# Patient Record
Sex: Female | Born: 1970
Health system: Southern US, Academic
[De-identification: ages and names within clinical notes are randomized; demographics above are authoritative.]

## PROBLEM LIST (undated history)

## (undated) ENCOUNTER — Encounter

## (undated) ENCOUNTER — Ambulatory Visit

## (undated) ENCOUNTER — Telehealth

## (undated) ENCOUNTER — Ambulatory Visit: Payer: MEDICARE

## (undated) ENCOUNTER — Encounter: Attending: Dermatology | Primary: Dermatology

## (undated) ENCOUNTER — Ambulatory Visit: Attending: Pharmacist | Primary: Pharmacist

## (undated) ENCOUNTER — Encounter: Payer: MEDICARE | Attending: Pediatrics | Primary: Pediatrics

## (undated) ENCOUNTER — Non-Acute Institutional Stay: Payer: MEDICARE

## (undated) ENCOUNTER — Telehealth: Attending: Dermatology | Primary: Dermatology

## (undated) ENCOUNTER — Encounter
Payer: MEDICARE | Attending: Rehabilitative and Restorative Service Providers" | Primary: Rehabilitative and Restorative Service Providers"

## (undated) ENCOUNTER — Ambulatory Visit
Payer: MEDICARE | Attending: Student in an Organized Health Care Education/Training Program | Primary: Student in an Organized Health Care Education/Training Program

## (undated) ENCOUNTER — Encounter: Payer: MEDICARE | Attending: Dermatology | Primary: Dermatology

## (undated) DIAGNOSIS — G4730 Sleep apnea, unspecified: Secondary | ICD-10-CM

## (undated) DIAGNOSIS — I10 Essential (primary) hypertension: Secondary | ICD-10-CM

## (undated) DIAGNOSIS — M797 Fibromyalgia: Secondary | ICD-10-CM

## (undated) DIAGNOSIS — G5600 Carpal tunnel syndrome, unspecified upper limb: Secondary | ICD-10-CM

## (undated) DIAGNOSIS — E785 Hyperlipidemia, unspecified: Secondary | ICD-10-CM

## (undated) DIAGNOSIS — R87610 Atypical squamous cells of undetermined significance on cytologic smear of cervix (ASC-US): Secondary | ICD-10-CM

## (undated) DIAGNOSIS — C539 Malignant neoplasm of cervix uteri, unspecified: Secondary | ICD-10-CM

## (undated) DIAGNOSIS — G43909 Migraine, unspecified, not intractable, without status migrainosus: Secondary | ICD-10-CM

## (undated) DIAGNOSIS — E669 Obesity, unspecified: Secondary | ICD-10-CM

## (undated) DIAGNOSIS — S82891A Other fracture of right lower leg, initial encounter for closed fracture: Secondary | ICD-10-CM

## (undated) DIAGNOSIS — F419 Anxiety disorder, unspecified: Secondary | ICD-10-CM

## (undated) HISTORY — PX: CARPAL TUNNEL RELEASE: SHX101

## (undated) HISTORY — DX: Essential (primary) hypertension: I10

## (undated) HISTORY — DX: Obesity, unspecified: E66.9

## (undated) HISTORY — DX: Other fracture of right lower leg, initial encounter for closed fracture: S82.891A

## (undated) HISTORY — DX: Fibromyalgia: M79.7

## (undated) HISTORY — DX: Hyperlipidemia, unspecified: E78.5

## (undated) HISTORY — DX: Malignant neoplasm of cervix uteri, unspecified: C53.9

## (undated) HISTORY — DX: Atypical squamous cells of undetermined significance on cytologic smear of cervix (ASC-US): R87.610

## (undated) HISTORY — DX: Carpal tunnel syndrome, unspecified upper limb: G56.00

## (undated) HISTORY — DX: Anxiety disorder, unspecified: F41.9

---
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
## 1992-07-14 HISTORY — PX: CHOLECYSTECTOMY: SHX55

## 1996-05-16 DIAGNOSIS — C539 Malignant neoplasm of cervix uteri, unspecified: Secondary | ICD-10-CM

## 1996-05-16 HISTORY — DX: Malignant neoplasm of cervix uteri, unspecified: C53.9

## 1996-10-14 HISTORY — PX: ABDOMINAL HYSTERECTOMY: SHX81

## 1997-08-18 ENCOUNTER — Ambulatory Visit (HOSPITAL_COMMUNITY): Admission: RE | Admit: 1997-08-18 | Discharge: 1997-08-18 | Payer: Self-pay | Admitting: Gynecology

## 1997-09-18 ENCOUNTER — Other Ambulatory Visit: Admission: RE | Admit: 1997-09-18 | Discharge: 1997-09-18 | Payer: Self-pay | Admitting: Gynecology

## 1997-09-19 ENCOUNTER — Other Ambulatory Visit: Admission: RE | Admit: 1997-09-19 | Discharge: 1997-09-19 | Payer: Self-pay | Admitting: Gynecology

## 1997-10-15 ENCOUNTER — Other Ambulatory Visit: Admission: RE | Admit: 1997-10-15 | Discharge: 1997-10-15 | Payer: Self-pay | Admitting: Gynecology

## 1997-11-10 ENCOUNTER — Inpatient Hospital Stay (HOSPITAL_COMMUNITY): Admission: RE | Admit: 1997-11-10 | Discharge: 1997-11-13 | Payer: Self-pay | Admitting: Gynecology

## 1998-02-16 ENCOUNTER — Emergency Department (HOSPITAL_COMMUNITY): Admission: EM | Admit: 1998-02-16 | Discharge: 1998-02-16 | Payer: Self-pay | Admitting: Emergency Medicine

## 1999-06-24 ENCOUNTER — Other Ambulatory Visit: Admission: RE | Admit: 1999-06-24 | Discharge: 1999-06-24 | Payer: Self-pay | Admitting: Gynecology

## 1999-09-03 ENCOUNTER — Emergency Department (HOSPITAL_COMMUNITY): Admission: EM | Admit: 1999-09-03 | Discharge: 1999-09-03 | Payer: Self-pay | Admitting: Emergency Medicine

## 1999-09-03 ENCOUNTER — Encounter: Payer: Self-pay | Admitting: Emergency Medicine

## 1999-12-08 ENCOUNTER — Encounter: Payer: Self-pay | Admitting: Gynecology

## 1999-12-08 ENCOUNTER — Ambulatory Visit (HOSPITAL_COMMUNITY): Admission: RE | Admit: 1999-12-08 | Discharge: 1999-12-08 | Payer: Self-pay | Admitting: Gynecology

## 2000-07-14 ENCOUNTER — Other Ambulatory Visit: Admission: RE | Admit: 2000-07-14 | Discharge: 2000-07-14 | Payer: Self-pay | Admitting: Gynecology

## 2001-11-27 ENCOUNTER — Other Ambulatory Visit: Admission: RE | Admit: 2001-11-27 | Discharge: 2001-11-27 | Payer: Self-pay | Admitting: Gynecology

## 2001-11-30 ENCOUNTER — Encounter: Payer: Self-pay | Admitting: Gynecology

## 2001-11-30 ENCOUNTER — Ambulatory Visit (HOSPITAL_COMMUNITY): Admission: RE | Admit: 2001-11-30 | Discharge: 2001-11-30 | Payer: Self-pay | Admitting: Gynecology

## 2003-04-18 ENCOUNTER — Other Ambulatory Visit: Admission: RE | Admit: 2003-04-18 | Discharge: 2003-04-18 | Payer: Self-pay | Admitting: Gynecology

## 2003-04-21 ENCOUNTER — Emergency Department (HOSPITAL_COMMUNITY): Admission: EM | Admit: 2003-04-21 | Discharge: 2003-04-22 | Payer: Self-pay | Admitting: Emergency Medicine

## 2003-08-05 ENCOUNTER — Emergency Department (HOSPITAL_COMMUNITY): Admission: EM | Admit: 2003-08-05 | Discharge: 2003-08-05 | Payer: Self-pay | Admitting: Emergency Medicine

## 2003-10-09 ENCOUNTER — Emergency Department (HOSPITAL_COMMUNITY): Admission: EM | Admit: 2003-10-09 | Discharge: 2003-10-09 | Payer: Self-pay | Admitting: Emergency Medicine

## 2003-12-26 ENCOUNTER — Ambulatory Visit (HOSPITAL_COMMUNITY): Admission: RE | Admit: 2003-12-26 | Discharge: 2003-12-26 | Payer: Self-pay | Admitting: Family Medicine

## 2003-12-30 ENCOUNTER — Ambulatory Visit (HOSPITAL_COMMUNITY): Admission: RE | Admit: 2003-12-30 | Discharge: 2003-12-30 | Payer: Self-pay | Admitting: Family Medicine

## 2004-06-17 ENCOUNTER — Other Ambulatory Visit: Admission: RE | Admit: 2004-06-17 | Discharge: 2004-06-17 | Payer: Self-pay | Admitting: Gynecology

## 2004-06-25 ENCOUNTER — Encounter: Admission: RE | Admit: 2004-06-25 | Discharge: 2004-06-25 | Payer: Self-pay | Admitting: Family Medicine

## 2005-06-24 ENCOUNTER — Other Ambulatory Visit: Admission: RE | Admit: 2005-06-24 | Discharge: 2005-06-24 | Payer: Self-pay | Admitting: Gynecology

## 2005-12-09 ENCOUNTER — Ambulatory Visit: Payer: Self-pay | Admitting: Family Medicine

## 2006-01-13 ENCOUNTER — Ambulatory Visit: Payer: Self-pay | Admitting: Family Medicine

## 2006-03-10 ENCOUNTER — Encounter: Admission: RE | Admit: 2006-03-10 | Discharge: 2006-03-10 | Payer: Self-pay | Admitting: Rheumatology

## 2006-06-28 DIAGNOSIS — F411 Generalized anxiety disorder: Secondary | ICD-10-CM

## 2006-06-28 DIAGNOSIS — I10 Essential (primary) hypertension: Secondary | ICD-10-CM | POA: Insufficient documentation

## 2006-07-13 ENCOUNTER — Encounter: Admission: RE | Admit: 2006-07-13 | Discharge: 2006-07-13 | Payer: Self-pay | Admitting: Gynecology

## 2006-07-21 ENCOUNTER — Other Ambulatory Visit: Admission: RE | Admit: 2006-07-21 | Discharge: 2006-07-21 | Payer: Self-pay | Admitting: Gynecology

## 2006-07-28 ENCOUNTER — Ambulatory Visit: Payer: Self-pay | Admitting: Family Medicine

## 2006-07-28 LAB — CONVERTED CEMR LAB
BUN: 11 mg/dL (ref 6–23)
CO2: 28 meq/L (ref 19–32)
Calcium: 9.2 mg/dL (ref 8.4–10.5)
Chloride: 106 meq/L (ref 96–112)
Cholesterol: 256 mg/dL (ref 0–200)
Creatinine, Ser: 0.7 mg/dL (ref 0.4–1.2)
Direct LDL: 184.5 mg/dL
GFR calc Af Amer: 122 mL/min
GFR calc non Af Amer: 101 mL/min
Glucose, Bld: 74 mg/dL (ref 70–99)
HDL: 30.8 mg/dL — ABNORMAL LOW (ref >39.0)
Potassium: 3.5 meq/L (ref 3.5–5.1)
Sodium: 143 meq/L (ref 135–145)
Total CHOL/HDL Ratio: 8.3
Triglycerides: 255 mg/dL (ref 0–149)
VLDL: 51 mg/dL — ABNORMAL HIGH (ref 0–40)

## 2006-08-11 ENCOUNTER — Ambulatory Visit: Payer: Self-pay | Admitting: Family Medicine

## 2006-08-11 LAB — CONVERTED CEMR LAB
ALT: 34 units/L (ref 0–40)
AST: 25 units/L (ref 0–37)

## 2006-09-20 DIAGNOSIS — M797 Fibromyalgia: Secondary | ICD-10-CM

## 2006-09-22 ENCOUNTER — Encounter: Payer: Self-pay | Admitting: Family Medicine

## 2006-09-22 ENCOUNTER — Encounter (INDEPENDENT_AMBULATORY_CARE_PROVIDER_SITE_OTHER): Payer: Self-pay | Admitting: Family Medicine

## 2006-09-22 ENCOUNTER — Encounter: Admission: RE | Admit: 2006-09-22 | Discharge: 2006-09-22 | Payer: Self-pay | Admitting: Family Medicine

## 2006-09-22 ENCOUNTER — Ambulatory Visit: Payer: Self-pay | Admitting: Family Medicine

## 2006-09-22 DIAGNOSIS — E785 Hyperlipidemia, unspecified: Secondary | ICD-10-CM | POA: Insufficient documentation

## 2006-09-22 DIAGNOSIS — M25569 Pain in unspecified knee: Secondary | ICD-10-CM

## 2006-09-25 ENCOUNTER — Telehealth (INDEPENDENT_AMBULATORY_CARE_PROVIDER_SITE_OTHER): Payer: Self-pay | Admitting: *Deleted

## 2006-09-25 LAB — CONVERTED CEMR LAB
CO2: 30 meq/L (ref 19–32)
Calcium: 9.2 mg/dL (ref 8.4–10.5)
Creatinine, Ser: 0.8 mg/dL (ref 0.4–1.2)
Direct LDL: 128.2 mg/dL
GFR calc Af Amer: 104 mL/min
Glucose, Bld: 84 mg/dL (ref 70–99)
HDL: 27.2 mg/dL — ABNORMAL LOW (ref >39.0)
Potassium: 3.9 meq/L (ref 3.5–5.1)
Triglycerides: 239 mg/dL (ref 0–149)

## 2006-09-26 ENCOUNTER — Telehealth (INDEPENDENT_AMBULATORY_CARE_PROVIDER_SITE_OTHER): Payer: Self-pay | Admitting: *Deleted

## 2006-12-01 ENCOUNTER — Encounter (INDEPENDENT_AMBULATORY_CARE_PROVIDER_SITE_OTHER): Payer: Self-pay | Admitting: Family Medicine

## 2006-12-19 ENCOUNTER — Telehealth (INDEPENDENT_AMBULATORY_CARE_PROVIDER_SITE_OTHER): Payer: Self-pay | Admitting: *Deleted

## 2006-12-29 ENCOUNTER — Encounter (INDEPENDENT_AMBULATORY_CARE_PROVIDER_SITE_OTHER): Payer: Self-pay | Admitting: Family Medicine

## 2007-01-03 ENCOUNTER — Ambulatory Visit: Payer: Self-pay | Admitting: Family Medicine

## 2007-01-03 DIAGNOSIS — L0291 Cutaneous abscess, unspecified: Secondary | ICD-10-CM

## 2007-01-03 DIAGNOSIS — L0390 Cellulitis, unspecified: Secondary | ICD-10-CM | POA: Insufficient documentation

## 2007-01-03 LAB — CONVERTED CEMR LAB
AST: 14 units/L (ref 0–37)
HDL: 23.6 mg/dL — ABNORMAL LOW (ref >39.0)
Total CHOL/HDL Ratio: 6.9
Triglycerides: 312 mg/dL (ref 0–149)

## 2007-01-04 ENCOUNTER — Encounter: Admission: RE | Admit: 2007-01-04 | Discharge: 2007-01-04 | Payer: Self-pay | Admitting: Cardiology

## 2007-01-04 ENCOUNTER — Telehealth (INDEPENDENT_AMBULATORY_CARE_PROVIDER_SITE_OTHER): Payer: Self-pay | Admitting: *Deleted

## 2007-01-12 ENCOUNTER — Encounter (INDEPENDENT_AMBULATORY_CARE_PROVIDER_SITE_OTHER): Payer: Self-pay | Admitting: Family Medicine

## 2007-01-12 ENCOUNTER — Telehealth (INDEPENDENT_AMBULATORY_CARE_PROVIDER_SITE_OTHER): Payer: Self-pay | Admitting: *Deleted

## 2007-01-17 ENCOUNTER — Ambulatory Visit: Payer: Self-pay | Admitting: Family Medicine

## 2007-01-19 ENCOUNTER — Telehealth (INDEPENDENT_AMBULATORY_CARE_PROVIDER_SITE_OTHER): Payer: Self-pay | Admitting: *Deleted

## 2007-01-29 ENCOUNTER — Telehealth (INDEPENDENT_AMBULATORY_CARE_PROVIDER_SITE_OTHER): Payer: Self-pay | Admitting: *Deleted

## 2007-02-16 ENCOUNTER — Encounter (INDEPENDENT_AMBULATORY_CARE_PROVIDER_SITE_OTHER): Payer: Self-pay | Admitting: Family Medicine

## 2007-03-01 ENCOUNTER — Ambulatory Visit: Payer: Self-pay | Admitting: Family Medicine

## 2007-03-02 ENCOUNTER — Telehealth (INDEPENDENT_AMBULATORY_CARE_PROVIDER_SITE_OTHER): Payer: Self-pay | Admitting: *Deleted

## 2007-03-06 ENCOUNTER — Telehealth (INDEPENDENT_AMBULATORY_CARE_PROVIDER_SITE_OTHER): Payer: Self-pay | Admitting: *Deleted

## 2007-03-06 ENCOUNTER — Encounter (INDEPENDENT_AMBULATORY_CARE_PROVIDER_SITE_OTHER): Payer: Self-pay | Admitting: *Deleted

## 2007-03-12 ENCOUNTER — Telehealth (INDEPENDENT_AMBULATORY_CARE_PROVIDER_SITE_OTHER): Payer: Self-pay | Admitting: *Deleted

## 2007-03-14 ENCOUNTER — Telehealth (INDEPENDENT_AMBULATORY_CARE_PROVIDER_SITE_OTHER): Payer: Self-pay | Admitting: *Deleted

## 2007-07-18 ENCOUNTER — Telehealth (INDEPENDENT_AMBULATORY_CARE_PROVIDER_SITE_OTHER): Payer: Self-pay | Admitting: Family Medicine

## 2007-09-14 ENCOUNTER — Encounter: Payer: Self-pay | Admitting: Internal Medicine

## 2007-09-14 ENCOUNTER — Other Ambulatory Visit: Admission: RE | Admit: 2007-09-14 | Discharge: 2007-09-14 | Payer: Self-pay | Admitting: Gynecology

## 2007-12-20 ENCOUNTER — Ambulatory Visit: Payer: Self-pay | Admitting: Family Medicine

## 2008-01-18 ENCOUNTER — Telehealth: Payer: Self-pay | Admitting: Family Medicine

## 2008-01-28 ENCOUNTER — Ambulatory Visit: Payer: Self-pay | Admitting: Family Medicine

## 2008-03-17 ENCOUNTER — Ambulatory Visit: Payer: Self-pay | Admitting: Family Medicine

## 2008-03-26 ENCOUNTER — Ambulatory Visit: Payer: Self-pay | Admitting: Family Medicine

## 2008-03-26 DIAGNOSIS — R609 Edema, unspecified: Secondary | ICD-10-CM | POA: Insufficient documentation

## 2008-03-27 ENCOUNTER — Telehealth: Payer: Self-pay | Admitting: Family Medicine

## 2008-03-27 LAB — CONVERTED CEMR LAB
BUN: 16 mg/dL (ref 6–23)
Basophils Absolute: 0 10*3/uL (ref 0.0–0.1)
Basophils Relative: 0 % (ref 0.0–3.0)
Calcium: 9.5 mg/dL (ref 8.4–10.5)
Creatinine, Ser: 1.1 mg/dL (ref 0.4–1.2)
Eosinophils Absolute: 0.4 10*3/uL (ref 0.0–0.7)
Eosinophils Relative: 3.2 % (ref 0.0–5.0)
GFR calc Af Amer: 71 mL/min
GFR calc non Af Amer: 59 mL/min
HCT: 40.3 % (ref 36.0–46.0)
Hemoglobin: 14.1 g/dL (ref 12.0–15.0)
MCHC: 34.9 g/dL (ref 30.0–36.0)
MCV: 85 fL (ref 78.0–100.0)
Monocytes Absolute: 0.2 10*3/uL (ref 0.1–1.0)
Neutro Abs: 9.2 10*3/uL — ABNORMAL HIGH (ref 1.4–7.7)
Neutrophils Relative %: 76.1 % (ref 43.0–77.0)
RBC: 4.74 M/uL (ref 3.87–5.11)
TSH: 1.93 microintl units/mL (ref 0.35–5.50)

## 2008-04-02 ENCOUNTER — Ambulatory Visit: Payer: Self-pay | Admitting: Family Medicine

## 2008-04-03 ENCOUNTER — Emergency Department (HOSPITAL_COMMUNITY): Admission: EM | Admit: 2008-04-03 | Discharge: 2008-04-03 | Payer: Self-pay | Admitting: Emergency Medicine

## 2008-04-03 ENCOUNTER — Ambulatory Visit: Payer: Self-pay | Admitting: Family Medicine

## 2008-04-03 DIAGNOSIS — T887XXA Unspecified adverse effect of drug or medicament, initial encounter: Secondary | ICD-10-CM

## 2008-04-03 LAB — CONVERTED CEMR LAB
AST: 29 units/L (ref 0–37)
Albumin: 3.2 g/dL — ABNORMAL LOW (ref 3.5–5.2)
Alkaline Phosphatase: 54 units/L (ref 39–117)
BUN: 14 mg/dL (ref 6–23)
Bilirubin, Direct: 0.1 mg/dL (ref 0.0–0.3)
Chloride: 112 meq/L (ref 96–112)
GFR calc Af Amer: 80 mL/min
Glucose, Bld: 87 mg/dL (ref 70–99)
Potassium: 4.7 meq/L (ref 3.5–5.1)
Sodium: 138 meq/L (ref 135–145)
Total Protein: 6.5 g/dL (ref 6.0–8.3)

## 2008-04-04 ENCOUNTER — Telehealth: Payer: Self-pay | Admitting: Family Medicine

## 2008-04-04 ENCOUNTER — Encounter (INDEPENDENT_AMBULATORY_CARE_PROVIDER_SITE_OTHER): Payer: Self-pay | Admitting: *Deleted

## 2008-05-07 ENCOUNTER — Telehealth (INDEPENDENT_AMBULATORY_CARE_PROVIDER_SITE_OTHER): Payer: Self-pay | Admitting: *Deleted

## 2008-06-11 ENCOUNTER — Ambulatory Visit: Payer: Self-pay | Admitting: Family Medicine

## 2008-06-11 DIAGNOSIS — L03039 Cellulitis of unspecified toe: Secondary | ICD-10-CM | POA: Insufficient documentation

## 2008-06-17 ENCOUNTER — Ambulatory Visit: Payer: Self-pay | Admitting: Family Medicine

## 2008-06-17 DIAGNOSIS — L600 Ingrowing nail: Secondary | ICD-10-CM | POA: Insufficient documentation

## 2008-07-31 ENCOUNTER — Telehealth: Payer: Self-pay | Admitting: Family Medicine

## 2008-08-13 ENCOUNTER — Ambulatory Visit: Payer: Self-pay | Admitting: Family Medicine

## 2008-08-13 DIAGNOSIS — R519 Headache, unspecified: Secondary | ICD-10-CM | POA: Insufficient documentation

## 2008-08-13 DIAGNOSIS — R110 Nausea: Secondary | ICD-10-CM

## 2008-08-13 DIAGNOSIS — R51 Headache: Secondary | ICD-10-CM

## 2008-08-13 LAB — CONVERTED CEMR LAB
Bilirubin Urine: NEGATIVE
Ketones, urine, test strip: NEGATIVE
Nitrite: NEGATIVE
Protein, U semiquant: NEGATIVE
Urobilinogen, UA: 0.2

## 2008-08-14 ENCOUNTER — Telehealth: Payer: Self-pay | Admitting: Family Medicine

## 2008-08-14 LAB — CONVERTED CEMR LAB
AST: 26 units/L (ref 0–37)
Albumin: 3.5 g/dL (ref 3.5–5.2)
BUN: 12 mg/dL (ref 6–23)
Basophils Absolute: 0.1 10*3/uL (ref 0.0–0.1)
CO2: 27 meq/L (ref 19–32)
Eosinophils Absolute: 0.3 10*3/uL (ref 0.0–0.7)
GFR calc non Af Amer: 74.21 mL/min (ref >60)
Glucose, Bld: 85 mg/dL (ref 70–99)
HCT: 40.6 % (ref 36.0–46.0)
Hemoglobin: 14.2 g/dL (ref 12.0–15.0)
Lipase: 19 units/L (ref 11.0–59.0)
Lymphs Abs: 2.6 10*3/uL (ref 0.7–4.0)
MCHC: 35.1 g/dL (ref 30.0–36.0)
Monocytes Absolute: 0.7 10*3/uL (ref 0.1–1.0)
Neutro Abs: 5.1 10*3/uL (ref 1.4–7.7)
Platelets: 297 10*3/uL (ref 150.0–400.0)
Potassium: 4.8 meq/L (ref 3.5–5.1)
RDW: 13.3 % (ref 11.5–14.6)
Sodium: 141 meq/L (ref 135–145)
TSH: 1.48 microintl units/mL (ref 0.35–5.50)
Total Bilirubin: 0.5 mg/dL (ref 0.3–1.2)

## 2008-08-18 ENCOUNTER — Encounter (INDEPENDENT_AMBULATORY_CARE_PROVIDER_SITE_OTHER): Payer: Self-pay | Admitting: *Deleted

## 2008-09-03 ENCOUNTER — Encounter: Payer: Self-pay | Admitting: Family Medicine

## 2008-09-16 ENCOUNTER — Encounter: Payer: Self-pay | Admitting: Gynecology

## 2008-09-16 ENCOUNTER — Ambulatory Visit: Payer: Self-pay | Admitting: Gynecology

## 2008-09-16 ENCOUNTER — Other Ambulatory Visit: Admission: RE | Admit: 2008-09-16 | Discharge: 2008-09-16 | Payer: Self-pay | Admitting: Gynecology

## 2008-09-19 ENCOUNTER — Ambulatory Visit: Payer: Self-pay | Admitting: Family Medicine

## 2008-09-19 DIAGNOSIS — L989 Disorder of the skin and subcutaneous tissue, unspecified: Secondary | ICD-10-CM | POA: Insufficient documentation

## 2008-09-19 DIAGNOSIS — R5381 Other malaise: Secondary | ICD-10-CM | POA: Insufficient documentation

## 2008-09-19 DIAGNOSIS — R5383 Other fatigue: Secondary | ICD-10-CM

## 2008-09-22 ENCOUNTER — Telehealth: Payer: Self-pay | Admitting: Family Medicine

## 2008-09-23 ENCOUNTER — Telehealth (INDEPENDENT_AMBULATORY_CARE_PROVIDER_SITE_OTHER): Payer: Self-pay | Admitting: *Deleted

## 2008-09-23 LAB — CONVERTED CEMR LAB
ALT: 34 units/L (ref 0–35)
AST: 25 units/L (ref 0–37)
Albumin: 3.8 g/dL (ref 3.5–5.2)
Basophils Relative: 1 % (ref 0.0–3.0)
CO2: 24 meq/L (ref 19–32)
Calcium: 9.1 mg/dL (ref 8.4–10.5)
Cholesterol: 230 mg/dL — ABNORMAL HIGH (ref 0–200)
Eosinophils Absolute: 0.3 10*3/uL (ref 0.0–0.7)
GFR calc non Af Amer: 65.68 mL/min (ref >60)
HDL: 29.4 mg/dL — ABNORMAL LOW (ref >39.00)
Hemoglobin: 14.4 g/dL (ref 12.0–15.0)
Lymphocytes Relative: 22.8 % (ref 12.0–46.0)
MCHC: 33.1 g/dL (ref 30.0–36.0)
Monocytes Relative: 5.4 % (ref 3.0–12.0)
Neutrophils Relative %: 67.6 % (ref 43.0–77.0)
Potassium: 3.9 meq/L (ref 3.5–5.1)
RBC: 4.99 M/uL (ref 3.87–5.11)
Sodium: 139 meq/L (ref 135–145)
Total CHOL/HDL Ratio: 8
Triglycerides: 177 mg/dL — ABNORMAL HIGH (ref 0.0–149.0)
WBC: 9.8 10*3/uL (ref 4.5–10.5)

## 2008-09-29 ENCOUNTER — Encounter: Payer: Self-pay | Admitting: Family Medicine

## 2008-10-22 ENCOUNTER — Ambulatory Visit: Payer: Self-pay | Admitting: Family Medicine

## 2008-10-27 LAB — CONVERTED CEMR LAB
GFR calc non Af Amer: 74.13 mL/min (ref >60)
Potassium: 4.7 meq/L (ref 3.5–5.1)
Sodium: 140 meq/L (ref 135–145)

## 2008-10-28 ENCOUNTER — Telehealth (INDEPENDENT_AMBULATORY_CARE_PROVIDER_SITE_OTHER): Payer: Self-pay | Admitting: *Deleted

## 2008-10-31 ENCOUNTER — Ambulatory Visit: Payer: Self-pay | Admitting: Gynecology

## 2008-11-05 ENCOUNTER — Ambulatory Visit: Payer: Self-pay | Admitting: Family Medicine

## 2008-11-05 DIAGNOSIS — M79609 Pain in unspecified limb: Secondary | ICD-10-CM

## 2008-11-07 LAB — CONVERTED CEMR LAB
CO2: 25 meq/L (ref 19–32)
Chloride: 102 meq/L (ref 96–112)
Sodium: 138 meq/L (ref 135–145)

## 2008-11-24 ENCOUNTER — Ambulatory Visit: Payer: Self-pay | Admitting: Family Medicine

## 2008-11-24 DIAGNOSIS — J309 Allergic rhinitis, unspecified: Secondary | ICD-10-CM | POA: Insufficient documentation

## 2008-11-28 ENCOUNTER — Encounter (INDEPENDENT_AMBULATORY_CARE_PROVIDER_SITE_OTHER): Payer: Self-pay | Admitting: *Deleted

## 2008-12-02 ENCOUNTER — Telehealth: Payer: Self-pay | Admitting: Family Medicine

## 2008-12-19 ENCOUNTER — Telehealth (INDEPENDENT_AMBULATORY_CARE_PROVIDER_SITE_OTHER): Payer: Self-pay | Admitting: *Deleted

## 2008-12-23 ENCOUNTER — Ambulatory Visit: Payer: Self-pay | Admitting: Family Medicine

## 2008-12-23 DIAGNOSIS — R209 Unspecified disturbances of skin sensation: Secondary | ICD-10-CM | POA: Insufficient documentation

## 2008-12-23 DIAGNOSIS — L02219 Cutaneous abscess of trunk, unspecified: Secondary | ICD-10-CM

## 2008-12-23 DIAGNOSIS — L03319 Cellulitis of trunk, unspecified: Secondary | ICD-10-CM

## 2009-01-05 ENCOUNTER — Ambulatory Visit: Payer: Self-pay | Admitting: Family Medicine

## 2009-01-05 LAB — CONVERTED CEMR LAB
Bilirubin Urine: NEGATIVE
Blood in Urine, dipstick: NEGATIVE
Glucose, Urine, Semiquant: NEGATIVE
Ketones, urine, test strip: NEGATIVE
Protein, U semiquant: NEGATIVE
pH: 6

## 2009-01-26 ENCOUNTER — Telehealth (INDEPENDENT_AMBULATORY_CARE_PROVIDER_SITE_OTHER): Payer: Self-pay | Admitting: *Deleted

## 2009-01-27 ENCOUNTER — Telehealth (INDEPENDENT_AMBULATORY_CARE_PROVIDER_SITE_OTHER): Payer: Self-pay | Admitting: *Deleted

## 2009-02-13 ENCOUNTER — Telehealth (INDEPENDENT_AMBULATORY_CARE_PROVIDER_SITE_OTHER): Payer: Self-pay | Admitting: *Deleted

## 2009-02-18 ENCOUNTER — Ambulatory Visit: Payer: Self-pay | Admitting: Family Medicine

## 2009-03-04 ENCOUNTER — Ambulatory Visit: Payer: Self-pay | Admitting: Family Medicine

## 2009-03-24 ENCOUNTER — Encounter: Payer: Self-pay | Admitting: Gynecology

## 2009-03-24 ENCOUNTER — Other Ambulatory Visit: Admission: RE | Admit: 2009-03-24 | Discharge: 2009-03-24 | Payer: Self-pay | Admitting: Gynecology

## 2009-03-24 ENCOUNTER — Ambulatory Visit: Payer: Self-pay | Admitting: Gynecology

## 2009-03-25 ENCOUNTER — Ambulatory Visit: Payer: Self-pay | Admitting: Family

## 2009-03-30 ENCOUNTER — Encounter (INDEPENDENT_AMBULATORY_CARE_PROVIDER_SITE_OTHER): Payer: Self-pay | Admitting: *Deleted

## 2009-04-13 ENCOUNTER — Telehealth (INDEPENDENT_AMBULATORY_CARE_PROVIDER_SITE_OTHER): Payer: Self-pay | Admitting: *Deleted

## 2009-05-04 ENCOUNTER — Telehealth (INDEPENDENT_AMBULATORY_CARE_PROVIDER_SITE_OTHER): Payer: Self-pay | Admitting: *Deleted

## 2009-05-05 ENCOUNTER — Telehealth (INDEPENDENT_AMBULATORY_CARE_PROVIDER_SITE_OTHER): Payer: Self-pay | Admitting: *Deleted

## 2009-05-19 ENCOUNTER — Telehealth (INDEPENDENT_AMBULATORY_CARE_PROVIDER_SITE_OTHER): Payer: Self-pay | Admitting: *Deleted

## 2009-06-09 ENCOUNTER — Telehealth: Payer: Self-pay | Admitting: Family

## 2009-06-22 ENCOUNTER — Telehealth (INDEPENDENT_AMBULATORY_CARE_PROVIDER_SITE_OTHER): Payer: Self-pay | Admitting: *Deleted

## 2009-06-29 ENCOUNTER — Telehealth (INDEPENDENT_AMBULATORY_CARE_PROVIDER_SITE_OTHER): Payer: Self-pay | Admitting: *Deleted

## 2009-06-30 ENCOUNTER — Ambulatory Visit: Payer: Self-pay | Admitting: Internal Medicine

## 2009-06-30 DIAGNOSIS — J069 Acute upper respiratory infection, unspecified: Secondary | ICD-10-CM | POA: Insufficient documentation

## 2009-07-11 ENCOUNTER — Encounter: Admission: RE | Admit: 2009-07-11 | Discharge: 2009-07-11 | Payer: Self-pay | Admitting: Orthopedic Surgery

## 2009-07-21 ENCOUNTER — Telehealth (INDEPENDENT_AMBULATORY_CARE_PROVIDER_SITE_OTHER): Payer: Self-pay | Admitting: *Deleted

## 2009-08-07 ENCOUNTER — Telehealth (INDEPENDENT_AMBULATORY_CARE_PROVIDER_SITE_OTHER): Payer: Self-pay | Admitting: *Deleted

## 2009-08-07 ENCOUNTER — Telehealth: Payer: Self-pay | Admitting: Family

## 2009-08-17 ENCOUNTER — Ambulatory Visit: Payer: Self-pay | Admitting: Family Medicine

## 2009-08-17 DIAGNOSIS — M6240 Contracture of muscle, unspecified site: Secondary | ICD-10-CM | POA: Insufficient documentation

## 2009-08-18 ENCOUNTER — Telehealth (INDEPENDENT_AMBULATORY_CARE_PROVIDER_SITE_OTHER): Payer: Self-pay | Admitting: *Deleted

## 2009-08-24 ENCOUNTER — Telehealth (INDEPENDENT_AMBULATORY_CARE_PROVIDER_SITE_OTHER): Payer: Self-pay | Admitting: *Deleted

## 2009-09-03 ENCOUNTER — Encounter: Admission: RE | Admit: 2009-09-03 | Discharge: 2009-11-03 | Payer: Self-pay | Admitting: Family Medicine

## 2009-09-09 ENCOUNTER — Encounter: Payer: Self-pay | Admitting: Family Medicine

## 2009-09-11 ENCOUNTER — Telehealth (INDEPENDENT_AMBULATORY_CARE_PROVIDER_SITE_OTHER): Payer: Self-pay | Admitting: *Deleted

## 2009-09-17 ENCOUNTER — Ambulatory Visit: Payer: Self-pay | Admitting: Family Medicine

## 2009-10-06 ENCOUNTER — Telehealth (INDEPENDENT_AMBULATORY_CARE_PROVIDER_SITE_OTHER): Payer: Self-pay | Admitting: *Deleted

## 2009-10-08 ENCOUNTER — Ambulatory Visit: Payer: Self-pay | Admitting: Family Medicine

## 2009-10-08 DIAGNOSIS — R7309 Other abnormal glucose: Secondary | ICD-10-CM

## 2009-10-08 LAB — CONVERTED CEMR LAB: Blood Glucose, Fingerstick: 102

## 2009-10-09 ENCOUNTER — Telehealth (INDEPENDENT_AMBULATORY_CARE_PROVIDER_SITE_OTHER): Payer: Self-pay | Admitting: *Deleted

## 2009-10-09 LAB — CONVERTED CEMR LAB
Chloride: 106 meq/L (ref 96–112)
Creatinine, Ser: 0.7 mg/dL (ref 0.4–1.2)
Eosinophils Relative: 4.5 % (ref 0.0–5.0)
FSH: 2.9 milliintl units/mL
HCT: 39.5 % (ref 36.0–46.0)
Hgb A1c MFr Bld: 5.6 % (ref 4.6–6.5)
LH: 2.1 milliintl units/mL
Lymphs Abs: 2 10*3/uL (ref 0.7–4.0)
Monocytes Relative: 7 % (ref 3.0–12.0)
Platelets: 251 10*3/uL (ref 150.0–400.0)
Potassium: 4.8 meq/L (ref 3.5–5.1)
TSH: 1.81 microintl units/mL (ref 0.35–5.50)
WBC: 7.1 10*3/uL (ref 4.5–10.5)

## 2009-10-20 ENCOUNTER — Telehealth (INDEPENDENT_AMBULATORY_CARE_PROVIDER_SITE_OTHER): Payer: Self-pay | Admitting: *Deleted

## 2009-10-29 ENCOUNTER — Encounter: Payer: Self-pay | Admitting: Family Medicine

## 2009-11-03 ENCOUNTER — Ambulatory Visit: Payer: Self-pay | Admitting: Family Medicine

## 2009-11-05 ENCOUNTER — Telehealth (INDEPENDENT_AMBULATORY_CARE_PROVIDER_SITE_OTHER): Payer: Self-pay | Admitting: *Deleted

## 2009-11-05 LAB — CONVERTED CEMR LAB
ALT: 41 units/L — ABNORMAL HIGH (ref 0–35)
AST: 37 units/L (ref 0–37)
Albumin: 4.1 g/dL (ref 3.5–5.2)
Cholesterol: 252 mg/dL — ABNORMAL HIGH (ref 0–200)
Direct LDL: 171.1 mg/dL
Total CHOL/HDL Ratio: 7
Total Protein: 7.5 g/dL (ref 6.0–8.3)
Triglycerides: 334 mg/dL — ABNORMAL HIGH (ref 0.0–149.0)

## 2009-11-25 ENCOUNTER — Ambulatory Visit: Payer: Self-pay | Admitting: Gynecology

## 2009-11-25 ENCOUNTER — Other Ambulatory Visit: Admission: RE | Admit: 2009-11-25 | Discharge: 2009-11-25 | Payer: Self-pay | Admitting: Gynecology

## 2009-11-30 ENCOUNTER — Telehealth: Payer: Self-pay | Admitting: Family Medicine

## 2009-12-21 ENCOUNTER — Encounter: Admission: RE | Admit: 2009-12-21 | Discharge: 2009-12-21 | Payer: Self-pay | Admitting: Gynecology

## 2009-12-28 ENCOUNTER — Telehealth: Payer: Self-pay | Admitting: Family Medicine

## 2009-12-31 ENCOUNTER — Ambulatory Visit: Payer: Self-pay | Admitting: Family Medicine

## 2009-12-31 DIAGNOSIS — J019 Acute sinusitis, unspecified: Secondary | ICD-10-CM

## 2009-12-31 DIAGNOSIS — R748 Abnormal levels of other serum enzymes: Secondary | ICD-10-CM | POA: Insufficient documentation

## 2010-01-01 ENCOUNTER — Telehealth (INDEPENDENT_AMBULATORY_CARE_PROVIDER_SITE_OTHER): Payer: Self-pay | Admitting: *Deleted

## 2010-01-01 LAB — CONVERTED CEMR LAB: Albumin: 3.7 g/dL (ref 3.5–5.2)

## 2010-01-04 ENCOUNTER — Telehealth (INDEPENDENT_AMBULATORY_CARE_PROVIDER_SITE_OTHER): Payer: Self-pay | Admitting: *Deleted

## 2010-01-06 ENCOUNTER — Telehealth (INDEPENDENT_AMBULATORY_CARE_PROVIDER_SITE_OTHER): Payer: Self-pay | Admitting: *Deleted

## 2010-01-08 ENCOUNTER — Encounter: Payer: Self-pay | Admitting: Family Medicine

## 2010-01-08 ENCOUNTER — Ambulatory Visit: Payer: Self-pay | Admitting: Gynecology

## 2010-01-10 ENCOUNTER — Encounter: Payer: Self-pay | Admitting: Family Medicine

## 2010-01-20 ENCOUNTER — Encounter: Payer: Self-pay | Admitting: Family Medicine

## 2010-01-21 ENCOUNTER — Telehealth (INDEPENDENT_AMBULATORY_CARE_PROVIDER_SITE_OTHER): Payer: Self-pay | Admitting: *Deleted

## 2010-01-29 ENCOUNTER — Telehealth (INDEPENDENT_AMBULATORY_CARE_PROVIDER_SITE_OTHER): Payer: Self-pay | Admitting: *Deleted

## 2010-02-15 ENCOUNTER — Ambulatory Visit: Payer: Self-pay | Admitting: Family Medicine

## 2010-02-18 ENCOUNTER — Telehealth (INDEPENDENT_AMBULATORY_CARE_PROVIDER_SITE_OTHER): Payer: Self-pay | Admitting: *Deleted

## 2010-02-26 ENCOUNTER — Telehealth (INDEPENDENT_AMBULATORY_CARE_PROVIDER_SITE_OTHER): Payer: Self-pay | Admitting: *Deleted

## 2010-03-15 ENCOUNTER — Telehealth (INDEPENDENT_AMBULATORY_CARE_PROVIDER_SITE_OTHER): Payer: Self-pay | Admitting: *Deleted

## 2010-04-13 ENCOUNTER — Telehealth (INDEPENDENT_AMBULATORY_CARE_PROVIDER_SITE_OTHER): Payer: Self-pay | Admitting: *Deleted

## 2010-04-22 ENCOUNTER — Telehealth (INDEPENDENT_AMBULATORY_CARE_PROVIDER_SITE_OTHER): Payer: Self-pay | Admitting: *Deleted

## 2010-04-27 ENCOUNTER — Ambulatory Visit: Payer: Self-pay | Admitting: Family Medicine

## 2010-04-28 LAB — CONVERTED CEMR LAB
ALT: 71 units/L — ABNORMAL HIGH (ref 0–35)
Albumin: 3.6 g/dL (ref 3.5–5.2)
Chloride: 103 meq/L (ref 96–112)
Cholesterol: 242 mg/dL — ABNORMAL HIGH (ref 0–200)
Direct LDL: 158 mg/dL
GFR calc non Af Amer: 80.76 mL/min (ref >60.00)
HDL: 32.9 mg/dL — ABNORMAL LOW (ref >39.00)
Potassium: 4.6 meq/L (ref 3.5–5.1)
Total Protein: 7.1 g/dL (ref 6.0–8.3)
Triglycerides: 344 mg/dL — ABNORMAL HIGH (ref 0.0–149.0)
VLDL: 68.8 mg/dL — ABNORMAL HIGH (ref 0.0–40.0)

## 2010-04-29 ENCOUNTER — Telehealth (INDEPENDENT_AMBULATORY_CARE_PROVIDER_SITE_OTHER): Payer: Self-pay | Admitting: *Deleted

## 2010-04-30 ENCOUNTER — Telehealth (INDEPENDENT_AMBULATORY_CARE_PROVIDER_SITE_OTHER): Payer: Self-pay | Admitting: *Deleted

## 2010-05-07 ENCOUNTER — Telehealth (INDEPENDENT_AMBULATORY_CARE_PROVIDER_SITE_OTHER): Payer: Self-pay | Admitting: *Deleted

## 2010-05-12 ENCOUNTER — Ambulatory Visit: Payer: Self-pay | Admitting: Family Medicine

## 2010-05-12 ENCOUNTER — Encounter
Admission: RE | Admit: 2010-05-12 | Discharge: 2010-06-15 | Payer: Self-pay | Source: Home / Self Care | Attending: Family Medicine | Admitting: Family Medicine

## 2010-05-12 DIAGNOSIS — R7989 Other specified abnormal findings of blood chemistry: Secondary | ICD-10-CM | POA: Insufficient documentation

## 2010-05-14 ENCOUNTER — Telehealth (INDEPENDENT_AMBULATORY_CARE_PROVIDER_SITE_OTHER): Payer: Self-pay | Admitting: *Deleted

## 2010-05-14 LAB — CONVERTED CEMR LAB
AST: 61 units/L — ABNORMAL HIGH (ref 0–37)
Alkaline Phosphatase: 99 units/L (ref 39–117)
Total Bilirubin: 0.6 mg/dL (ref 0.3–1.2)

## 2010-05-18 ENCOUNTER — Telehealth: Payer: Self-pay | Admitting: Family Medicine

## 2010-06-02 ENCOUNTER — Ambulatory Visit
Admission: RE | Admit: 2010-06-02 | Discharge: 2010-06-02 | Payer: Self-pay | Source: Home / Self Care | Attending: Family Medicine | Admitting: Family Medicine

## 2010-06-03 ENCOUNTER — Telehealth (INDEPENDENT_AMBULATORY_CARE_PROVIDER_SITE_OTHER): Payer: Self-pay | Admitting: *Deleted

## 2010-06-06 ENCOUNTER — Encounter: Payer: Self-pay | Admitting: Family Medicine

## 2010-06-10 ENCOUNTER — Ambulatory Visit: Admit: 2010-06-10 | Payer: Self-pay | Admitting: Family Medicine

## 2010-06-17 NOTE — Progress Notes (Signed)
Summary: alprazolam and vicodin refill   Phone Note Refill Request Call back at [phone] Message from:  Pharmacy on January 21, 2010 9:12 AM  Refills Requested: Medication #1:  ALPRAZOLAM 0.5 MG TABS Take 1 to 2 tablets by mouth once daily as needed   Dosage confirmed as above?Dosage Confirmed   Supply Requested: 1 month   Last Refilled: 12/28/2009  Medication #2:  VICODIN 5-500 MG TABS 1-2 tablet every 6 hours as needed   Dosage confirmed as above?Dosage Confirmed   Supply Requested: 1 month   Last Refilled: 12/28/2009 [facility] [address]  Next Appointment Scheduled: 9.22.11 Initial call taken by: Harold Barban,  January 21, 2010 9:13 AM    Prescriptions: VICODIN 5-500 MG TABS (HYDROCODONE-ACETAMINOPHEN) 1-2 tablet every 6 hours as needed  #60 x 0   Entered by:   Doristine Devoid CMA   Authorized by:   Neena Rhymes MD   Signed by:   Doristine Devoid CMA on 01/22/2010   Method used:   Printed then faxed to ...       [facility] [address] [phone]* (retail)       [address]       [address]       [place], [place]  [address]       Ph: [phone]       Fax: [phone]   RxID:   [id number] ALPRAZOLAM 0.5 MG TABS (ALPRAZOLAM) Take 1 to 2 tablets by mouth once daily as needed  #30 x 0   Entered by:   Doristine Devoid CMA   Authorized by:   Neena Rhymes MD   Signed by:   Doristine Devoid CMA on 01/22/2010   Method used:   Printed then faxed to ...       [facility] [address] [phone]* (retail)       [address]       [address]       [place], [place]  [address]       Ph: [phone]       Fax: [phone]   RxID:   [id number]

## 2010-06-17 NOTE — Progress Notes (Signed)
Summary: wound draining-  Phone Note Call from Patient Call back at Home Phone [phone]   Caller: Patient Summary of Call: patient left msg on voicemail that wound on arm has opened up and she has about a quarter inch opening but notes that arm does feel better and wanted to know if she should do anything else. Initial call taken by: Doristine Devoid,  August 24, 2009 8:49 AM  Follow-up for Phone Call        left message on machine .Marland KitchenMarland KitchenMarland KitchenDoristine Devoid  August 24, 2009 8:50 AM  spoke w/ patient informed that I don't  think we need to see her she says overall wound seems to be doing fine advised let wound continue to drain just make sure finishes antibiotic and keep area covered and dry and if no improvement once medication is finished then let us know also ....Marland KitchenMarland KitchenDoristine Devoid  August 24, 2009 9:10 AM

## 2010-06-17 NOTE — Progress Notes (Signed)
Summary: alprazolam request  Phone Note Refill Request Message from:  Pharmacy on [facility] [address]  Refills Requested: Medication #1:  ALPRAZOLAM 0.5 MG TABS Take 1 to 2 tablets by mouth once daily as needed   Dosage confirmed as above?Dosage Confirmed   Supply Requested: 1 month   Last Refilled: 07/02/2009  Method Requested: Telephone to Pharmacy Next Appointment Scheduled: 08/17/09--tabori Initial call taken by: Mervin Kung CMA,  August 07, 2009 12:10 PM  Follow-up for Phone Call        ok to call in #30, zero refills Follow-up by: Lemont Fillers FNP,  August 07, 2009 12:14 PM    Prescriptions: ALPRAZOLAM 0.5 MG TABS (ALPRAZOLAM) Take 1 to 2 tablets by mouth once daily as needed  #30 x 0   Entered by:   Mervin Kung CMA   Authorized by:   Lemont Fillers FNP   Signed by:   Mervin Kung CMA on 08/07/2009   Method used:   Telephoned to ...       [facility] [address] [phone]* (retail)       [address]       [address]       [place], [place]  [address]       Ph: [phone]       Fax: [phone]   RxID:   [id number]

## 2010-06-17 NOTE — Progress Notes (Signed)
Summary: labs  Phone Note Outgoing Call   Call placed by: Doristine Devoid CMA,  January 01, 2010 1:36 PM Call placed to: Patient Summary of Call: LFTs now normal.  start Lipitor 40mg  at bedtime and repeat LFTs in 6-8 weeks   Follow-up for Phone Call        left message on machine ........Marland KitchenDoristine Devoid CMA  January 01, 2010 1:36 PM   spoke w/ patient aware of labs and that medication is to be started since labs are normal .......Marland KitchenDoristine Devoid CMA  January 01, 2010 4:40 PM     New/Updated Medications: LIPITOR 40 MG TABS (ATORVASTATIN CALCIUM) take one tablet at bedtime Prescriptions: LIPITOR 40 MG TABS (ATORVASTATIN CALCIUM) take one tablet at bedtime  #30 x 6   Entered by:   Doristine Devoid CMA   Authorized by:   Neena Rhymes MD   Signed by:   Doristine Devoid CMA on 01/01/2010   Method used:   Electronically to        [facility] [address] [phone]* (retail)       [address]       [address]       [place], [place]  [address]       Ph: [phone]       Fax: [phone]   RxID:   [id number]

## 2010-06-17 NOTE — Medication Information (Signed)
Summary: Approval for Lipitor/ACS  Approval for Lipitor/ACS   Imported By: Lanelle Bal 01/19/2010 12:16:43  _____________________________________________________________________  External Attachment:    Type:   Image     Comment:   External Document

## 2010-06-17 NOTE — Progress Notes (Signed)
Summary: Refill 2nd Request  Phone Note Refill Request Message from:  Fax from Pharmacy on [facility] [address] fax [phone]  Refills Requested: Medication #1:  VICODIN 5-500 MG TABS 1-2 tablet every 6 hours as needed. Initial call taken by: Barb Merino,  June 22, 2009 11:26 AM  Follow-up for Phone Call        Patient is waiting for her refill on hydrocodone.  Please call it in to [facility] [address] or call patient to let her know if she can have it.  Her number is [phone] Follow-up by: Barnie Mort,  June 23, 2009 2:35 PM  Additional Follow-up for Phone Call Additional follow up Details #1::        patient aware prescription called to pharmacy...Marland KitchenMarland KitchenDoristine Devoid  June 23, 2009 4:08 PM     Prescriptions: VICODIN 5-500 MG TABS (HYDROCODONE-ACETAMINOPHEN) 1-2 tablet every 6 hours as needed  #60 x 0   Entered by:   Doristine Devoid   Authorized by:   Neena Rhymes MD   Signed by:   Doristine Devoid on 06/23/2009   Method used:   Telephoned to ...       [facility] [address] [phone]* (retail)       [address]       [place], [place]  [address]       Ph: [phone]       Fax: [phone]   RxID:   [id number]

## 2010-06-17 NOTE — Progress Notes (Signed)
Summary: alprazolam refill  Phone Note Refill Request Message from:  Fax from Pharmacy on April 22, 2010 10:05 AM  Refills Requested: Medication #1:  ALPRAZOLAM 0.5 MG TABS Take 1 to 2 tablets by mouth once daily as needed Sander Nephew    phone [phone]   fax=[phone]    qty=30  Next Appointment Scheduled: Tues 04/27/2010 Initial call taken by: Jerolyn Shin,  April 22, 2010 10:08 AM    Prescriptions: ALPRAZOLAM 0.5 MG TABS (ALPRAZOLAM) Take 1 to 2 tablets by mouth once daily as needed  #30 x 0   Entered by:   Doristine Devoid CMA   Authorized by:   Neena Rhymes MD   Signed by:   Doristine Devoid CMA on 04/22/2010   Method used:   Printed then faxed to ...       [facility] [address] [phone]* (retail)       [address]       [address]       [place], [place]  [address]       Ph: [phone]       Fax: [phone]   RxID:   [id number]

## 2010-06-17 NOTE — Progress Notes (Signed)
Summary: refill  Phone Note Refill Request Message from:  Fax from Pharmacy on January 29, 2010 2:28 PM  Refills Requested: Medication #1:  BYSTOLIC 5 MG TABS 1 tab by mouth daily [facility] - fax [phone]  Initial call taken by: Okey Regal Spring,  January 29, 2010 2:29 PM    Prescriptions: BYSTOLIC 5 MG TABS (NEBIVOLOL HCL) 1 tab by mouth daily  #30 x 0   Entered by:   Jeremy Johann CMA   Authorized by:   Neena Rhymes MD   Signed by:   Jeremy Johann CMA on 01/29/2010   Method used:   Electronically to        [facility] [address] [phone]* (retail)       [address]       [address]       [place], [place]  [address]       Ph: [phone]       Fax: [phone]   RxID:   [id number]

## 2010-06-17 NOTE — Progress Notes (Signed)
Summary: Prior Auth not needed [facility]  Phone Note Refill Request Call back at [phone] Message from:  Pharmacy on May 18, 2010 3:09 PM  Refills Requested: Medication #1:  AMBIEN 10 MG TABS 1 tab by mouth at bedtime as needed for sleep.   Dosage confirmed as above?Dosage Confirmed   Brand Name Necessary? No   Supply Requested: 1 month Prior Auth  Initial call taken by: Harold Barban,  May 18, 2010 3:10 PM  Follow-up for Phone Call        Awaiting fax...........Marland KitchenFelecia Deloach CMA  May 19, 2010 8:48 AM  Called to check status of form, per rep form to be refax now.........Marland KitchenFelecia Deloach CMA  May 21, 2010 12:08 PM     Additional Follow-up for Phone Call Additional follow up Details #1::        Insurance only covers 15 tabs in a 30 day period. if Pt is needing more than that a quantity override is needed. Pls advise if PA needs to be initiated..........Marland KitchenFelecia Deloach CMA  May 21, 2010 4:51 PM     Additional Follow-up for Phone Call Additional follow up Details #2::    15 tabs is fine.  1 every other night would be appropriate. Follow-up by: Neena Rhymes MD,  May 23, 2010 2:44 PM  Additional Follow-up for Phone Call Additional follow up Details #3:: Details for Additional Follow-up Action Taken: Pt aware..............Marland KitchenFelecia Deloach CMA  May 24, 2010 10:21 AM   New/Updated Medications: AMBIEN 10 MG TABS (ZOLPIDEM TARTRATE) Take 1 tab every other night Prescriptions: AMBIEN 10 MG TABS (ZOLPIDEM TARTRATE) Take 1 tab every other night  #15 x 0   Entered by:   Jeremy Johann CMA   Authorized by:   Neena Rhymes MD   Signed by:   Jeremy Johann CMA on 05/24/2010   Method used:   Printed then faxed to ...       [facility] [address] [phone]* (retail)       [address]       [place], [place]  [address]       Ph: [phone] or [phone]       Fax: [phone]   RxID:   [id number]

## 2010-06-17 NOTE — Progress Notes (Signed)
Summary: Refill Request  Phone Note Refill Request Message from:  Pharmacy on [facility] [address] Valinda Hoar #: [phone]  Refills Requested: Medication #1:  VICODIN 5-500 MG TABS 1-2 tablet every 6 hours as needed   Dosage confirmed as above?Dosage Confirmed   Brand Name Necessary? No   Supply Requested: 1 month   Last Refilled: 08/14/2009 2nd request  Next Appointment Scheduled: 5.5.11 Initial call taken by: Harold Barban,  August 18, 2009 11:07 AM  Follow-up for Phone Call        patient left msg was developing same bumps that she was treated for previously and wanted to get prescription because they were painful but not draining ok per Dr.[name] patient aware prescription completed.....Marland KitchenMarland KitchenDoristine Devoid  August 18, 2009 1:20 PM     New/Updated Medications: CLINDAMYCIN HCL 300 MG CAPS (CLINDAMYCIN HCL) take one tablet three times a day x7days Prescriptions: VICODIN 5-500 MG TABS (HYDROCODONE-ACETAMINOPHEN) 1-2 tablet every 6 hours as needed  #60 x 0   Entered by:   Doristine Devoid   Authorized by:   Neena Rhymes MD   Signed by:   Doristine Devoid on 08/18/2009   Method used:   Printed then faxed to ...       [facility] [address] [phone]* (retail)       [address]       [address]       [place], [place]  [address]       Ph: [phone]       Fax: [phone]   RxID:   [id number] CLINDAMYCIN HCL 300 MG CAPS (CLINDAMYCIN HCL) take one tablet three times a day x7days  #21 x 0   Entered by:   Doristine Devoid   Authorized by:   Neena Rhymes MD   Signed by:   Doristine Devoid on 08/18/2009   Method used:   Printed then faxed to ...       [facility] [address] [phone]* (retail)       [address]       [address]       [place], [place]  [address]       Ph: [phone]       Fax: [phone]   RxID:   [id number]

## 2010-06-17 NOTE — Assessment & Plan Note (Signed)
Summary: fatigue/blood sugar up/cbs   Vital Signs:  Patient profile:   40 year old female Weight:      332 pounds Pulse rate:   80 / minute BP sitting:   132 / 80  (left arm)  Vitals Entered By: Doristine Devoid (Oct 08, 2009 9:13 AM) CC: fatigue and elevated blood sugar Is Patient Diabetic? No CBG Result 102   History of Present Illness: 40 yo woman here today for fatigue.  in tears.  'i feel terrible'.  in counseling for depression.  reports CBGs have been elevated (testing on mom's machine)- 183 tuesday AM.  has gained 9 lbs in 3 weeks- denies increased eating, snacking.  'i have no desire to eat.  i eat b/c i need to not b/c i want to'.  denies CP, SOB, cough, congestion, abd pain- 'i can't point to anything, i just feel awful'.  lack of motivation to 'move' per pt.  Current Medications (verified): 1)  Alprazolam 0.5 Mg Tabs (Alprazolam) .... Take 1 To 2 Tablets By Mouth Once Daily As Needed 2)  Flexeril 10 Mg  Tabs (Cyclobenzaprine Hcl) .... Take One Tablet Every 8 Hours. 3)  Fish Oil   Oil (Fish Oil) .... Take 1 Capsule By Mouth Two Times A Day 4)  Furosemide 40 Mg Tabs (Furosemide) .Marland Kitchen.. 1 Tab By Mouth Daily 5)  Tramadol Hcl 50 Mg Tabs (Tramadol Hcl) .Marland Kitchen.. 1-2 Tablets Three Times A Day As Needed 6)  Venlafaxine Hcl 37.5 Mg Xr24h-Tab (Venlafaxine Hcl) .... 2 Tabs Daily. 7)  Bystolic 5 Mg Tabs (Nebivolol Hcl) .Marland Kitchen.. 1 Tab By Mouth Daily 8)  Vicodin 5-500 Mg Tabs (Hydrocodone-Acetaminophen) .Marland Kitchen.. 1-2 Tablet Every 6 Hours As Needed 9)  Robaxin 500 Mg Tabs (Methocarbamol) .... Take As Needed 10)  Neurontin 300 Mg Caps (Gabapentin) .... Take One Tablet Daily  Allergies (verified): 1)  ! Aspirin 2)  ! Naprosyn 3)  ! Niacin 4)  ! Doxycycline 5)  ! Bactrim 6)  ! Keflex 7)  ! Sulfa 8)  ! Lisinopril 9)  ! Steri-Strip (Adhesive Bandages) 10)  ! Benzoin Compound (Benzoin Compound)  Past History:  Past Surgical History: Last updated: 08/17/2009 Cholecystectomy Hysterectomy stress  test 01/2007 cardiac cath 01/2007 ECHo nml 01/2007 bilateral carpal tunnel release  Review of Systems      See HPI  Physical Exam  General:  obese, alert and well-developed.  Neck:  supple, full ROM Lungs:  normal respiratory effort, no intercostal retractions, no accessory muscle use, and normal breath sounds.   Heart:  Normal rate and regular rhythm. S1 and S2 normal without gallop, murmur, click, rub or other extra sounds. Abdomen:  Bowel sounds positive,abdomen soft and non-tender, + BS. Pulses:  R and L PT/DP, radial, carotid full and equal Extremities:  trace edema of lower extremities Psych:  tearful   Impression & Recommendations:  Problem # 1:  FATIGUE (ICD-780.79) Assessment Deteriorated PE w/out any clues to pt's sxs.  check labs to assess thyroid, anemia, and possibly early menopause (pt had hysterectomy and unable to rely on periods for this info).  ? if this is all depression related.  will follow closely. Orders: Venipuncture ([address]) TLB-CBC Platelet - w/Differential (85025-CBCD) TLB-TSH (Thyroid Stimulating Hormone) (84443-TSH) TLB-FSH (Follicle Stimulating Hormone) (83001-FSH) TLB-Luteinizing Hormone (LH) (83002-LH)  Problem # 2:  HYPERGLYCEMIA, FASTING (ICD-790.29) Assessment: New given family hx and pt's obesity not suprised by elevated CBGs.  check labs to confirm dx of diabetes. Orders: TLB-BMP (Basic Metabolic Panel-BMET) (80048-METABOL) TLB-A1C / Hgb  A1C (Glycohemoglobin) (83036-A1C)  Complete Medication List: 1)  Alprazolam 0.5 Mg Tabs (Alprazolam) .... Take 1 to 2 tablets by mouth once daily as needed 2)  Flexeril 10 Mg Tabs (Cyclobenzaprine hcl) .... Take one tablet every 8 hours. 3)  Fish Oil Oil (Fish oil) .... Take 1 capsule by mouth two times a day 4)  Furosemide 40 Mg Tabs (Furosemide) .Marland Kitchen.. 1 tab by mouth daily 5)  Tramadol Hcl 50 Mg Tabs (Tramadol hcl) .Marland Kitchen.. 1-2 tablets three times a day as needed 6)  Venlafaxine Hcl 37.5 Mg Xr24h-tab  (Venlafaxine hcl) .... 2 tabs daily. 7)  Bystolic 5 Mg Tabs (Nebivolol hcl) .Marland Kitchen.. 1 tab by mouth daily 8)  Vicodin 5-500 Mg Tabs (Hydrocodone-acetaminophen) .Marland Kitchen.. 1-2 tablet every 6 hours as needed 9)  Robaxin 500 Mg Tabs (Methocarbamol) .... Take as needed 10)  Neurontin 300 Mg Caps (Gabapentin) .... Take one tablet daily  Patient Instructions: 1)  We'll call you with your lab results and schedule an appt based on these 2)  Force yourself to go to your water aerobics 3)  Continue your counselling 4)  Try and hang in there!

## 2010-06-17 NOTE — Progress Notes (Signed)
Summary: Alprazolam refill  Phone Note Refill Request Message from:  Fax from Pharmacy on June 03, 2010 1:28 PM  Refills Requested: Medication #1:  ALPRAZOLAM 0.5 MG TABS Take 1 to 2 tablets by mouth once daily as needed   Last Refilled: 05/14/2010 [facility] ,  [address], [place], [place]     phone - [phone]    fax - [phone]    qty -30  Next Appointment Scheduled: Tues 2/21   [name] Initial call taken by: Jerolyn Shin,  June 03, 2010 1:30 PM    Prescriptions: ALPRAZOLAM 0.5 MG TABS (ALPRAZOLAM) Take 1 to 2 tablets by mouth once daily as needed  #30 x 0   Entered by:   Doristine Devoid CMA   Authorized by:   Neena Rhymes MD   Signed by:   Doristine Devoid CMA on 06/04/2010   Method used:   Telephoned to ...       [facility] [phone]* (retail)       [address]       [address]       [place], [place]  [address]       Ph: [phone]       Fax: [phone]   RxID:   [id number]

## 2010-06-17 NOTE — Progress Notes (Signed)
Summary: refill  Phone Note Refill Request Message from:  Fax from Pharmacy on April 29, 2010 2:07 PM  Refills Requested: Medication #1:  VICODIN 5-500 MG TABS 1-2 tablet every 6 hours as needed [facility] -fax [phone]  Initial call taken by: Okey Regal Spring,  April 29, 2010 2:08 PM  Follow-up for Phone Call        will hold off on refill until labs are repeated .........Marland KitchenDoristine Devoid CMA  April 30, 2010 2:17 PM

## 2010-06-17 NOTE — Assessment & Plan Note (Signed)
Summary: f/u on meds/cdj   Vital Signs:  Patient profile:   40 year old female Height:      62.50 inches Weight:      323.8 pounds Pulse rate:   68 / minute BP sitting:   136 / 70  Vitals Entered By: rachel peeler CC: ov-sinuses Comments 1 wk-head&chest congestion, cough, "ears in tunnels" pt. states possible fluid behind ears pt. has been taken advil congestion and tussinex-pm   History of Present Illness: as above  Allergies: 1)  ! Aspirin 2)  ! Naprosyn 3)  ! Niacin 4)  ! Doxycycline 5)  ! Bactrim 6)  ! Keflex 7)  ! Sulfa 8)  ! Lisinopril 9)  ! Steri-Strip (Adhesive Bandages) 10)  ! Benzoin Compound (Benzoin Compound)  Past History:  Past Medical History: OBESITY (ICD-278.00) HYPERLIPIDEMIA (ICD-272.4) FIBROMYALGIA (ICD-729.1) HYPERTENSION (ICD-401.9) ANXIETY   Past Surgical History: Reviewed history from 12/20/2007 and no changes required. Cholecystectomy Hysterectomy stress test 01/2007 cardiac cath 01/2007 ECHo nml 01/2007  Social History: Reviewed history from 03/25/2009 and no changes required. Divorced with 1 child age 52 Never Smoked Drug use-no Regular exercise- walking daily about 2 miles Alcohol use-no  Review of Systems       admits to a sinus pain and green nasal discharge coughing up a small amount of green sputum had sore throat at first but that is resolved currently not having any nausea vomiting or diarrhea  Physical Exam  General:  alert and well-developed.   Head:   face symmetric, slightly tender and the maxillary sinuses Ears:  R ear normal and L ear normal.   Nose:  congested Mouth:  not read the discharge Lungs:  normal respiratory effort, no intercostal retractions, no accessory muscle use, and normal breath sounds.     Impression & Recommendations:  Problem # 1:  URI (ICD-465.9) URI with symptoms of bronchitis and sinusitis, see instructions Her updated medication list for this problem includes:    Promethazine  Hcl 25 Mg Tabs (Promethazine hcl) .Marland Kitchen... Take one tablet 4 times a day as needed for nausea.  Complete Medication List: 1)  Alprazolam 0.5 Mg Tabs (Alprazolam) .... Take 1 to 2 tablets by mouth once daily as needed 2)  Flexeril 10 Mg Tabs (Cyclobenzaprine hcl) .... Take one tablet every 8 hours. 3)  Fish Oil Oil (Fish oil) .... Take 1 capsule by mouth two times a day 4)  Furosemide 40 Mg Tabs (Furosemide) .Marland Kitchen.. 1 tab by mouth daily 5)  Promethazine Hcl 25 Mg Tabs (Promethazine hcl) .... Take one tablet 4 times a day as needed for nausea. 6)  Tramadol Hcl 50 Mg Tabs (Tramadol hcl) .Marland Kitchen.. 1-2 tablets three times a day as needed 7)  Venlafaxine Hcl 37.5 Mg Xr24h-tab (Venlafaxine hcl) .... 2 tabs daily. 8)  Bystolic 5 Mg Tabs (Nebivolol hcl) .Marland Kitchen.. 1 tab by mouth daily 9)  Phentermine Hcl 37.5 Mg Tabs (Phentermine hcl) .Marland Kitchen.. 1 tab by mouth daily 10)  Vicodin 5-500 Mg Tabs (Hydrocodone-acetaminophen) .Marland Kitchen.. 1-2 tablet every 6 hours as needed 11)  Amoxicillin 500 Mg Caps (Amoxicillin) .... Two by mouth twice a day  Patient Instructions: 1)  rest, fluids, Tylenol 2)  continue with over-the-counter medications for cough and congestion 3)  amoxicillin for 10 days 4)  call if not better by next week Prescriptions: AMOXICILLIN 500 MG CAPS (AMOXICILLIN) two by mouth twice a day  #40 x 0   Entered and Authorized by:   Nolon Rod. Trevan Messman MD   Signed  by:   Nolon Rod Ben Sanz MD on 06/30/2009   Method used:   Electronically to        [facility] [phone]* (retail)       [address]       [place], [place]  [address]       Ph: [phone]       Fax: [phone]   RxID:   [id number]

## 2010-06-17 NOTE — Letter (Signed)
Summary: Disability letter   at [facility]  [address] [place], [place] [address]   Phone: [phone]  Fax: [phone]    01/20/2010  [facility] [address] [place], [place]  [address]  To Whom It May Concern,  Ms [name] (ssn [id number]) is applying for disability due to multiple medical conditions.  I have been her primary care physician for the last 2 years and have been treating her for depression, foot pain, parasthesias, obesity, and fibromyalgia.  Complete medical records are available for review if requested.  She has also had bilateral carpal tunnel releases and treatment for her right ankle by an orthopedic surgeon- Dr Marciano Sequin.  Because of her chronic pain from the fibromyalgia and inability to exercise, her weight has become a serious health issue for her.  Her weight has also led to worsening depression.  Due to all these factors, [name] is unable to stand or walk for more than 2-3 hours out of an eight hour day.  She is severely limited in stooping, bending, kneeling, crouching, or climbing.  Her gross manipulation is normal but given her parasthesias, her fine manipulation is somewhat limited.  Please take these factors into account when reviewing her disability status.  Feel free to contact me with any questions or concerns.  Sincerely,    Neena Rhymes MD

## 2010-06-17 NOTE — Progress Notes (Signed)
Summary: Refill Request  Phone Note Refill Request Message from:  Pharmacy on [address] Fax #: [phone]  Refills Requested: Medication #1:  FUROSEMIDE 40 MG TABS 1 tab by mouth daily   Dosage confirmed as above?Dosage Confirmed 2nd request  Initial call taken by: Harold Barban,  June 29, 2009 11:46 AM  Follow-up for Phone Call        rx sent to [facility] [address] 06/19/09. pharmacist informed, left msg for pt rx at [facility] [address] Follow-up by: Kandice Hams,  June 29, 2009 12:52 PM

## 2010-06-17 NOTE — Miscellaneous (Signed)
Summary: PT Initial Summary/[facility]  PT Initial Summary/[facility]   Imported By: Lanelle Bal 09/14/2009 11:56:01  _____________________________________________________________________  External Attachment:    Type:   Image     Comment:   External Document

## 2010-06-17 NOTE — Miscellaneous (Signed)
Summary: PT Discharge/[facility]  PT Discharge/[facility]   Imported By: Lanelle Bal 11/11/2009 11:24:09  _____________________________________________________________________  External Attachment:    Type:   Image     Comment:   External Document

## 2010-06-17 NOTE — Progress Notes (Signed)
Summary: Refill Request  Phone Note Refill Request Call back at [phone] Message from:  Pharmacy on September 11, 2009 10:22 AM  Refills Requested: Medication #1:  VICODIN 5-500 MG TABS 1-2 tablet every 6 hours as needed   Dosage confirmed as above?Dosage Confirmed   Brand Name Necessary? No   Supply Requested: 1 month   Last Refilled: 08/18/2009 [facility] on [address]   Next Appointment Scheduled: 5.5.11 Initial call taken by: Harold Barban,  September 11, 2009 10:22 AM    Prescriptions: VICODIN 5-500 MG TABS (HYDROCODONE-ACETAMINOPHEN) 1-2 tablet every 6 hours as needed  #60 x 0   Entered by:   Kandice Hams   Authorized by:   Neena Rhymes MD   Signed by:   Kandice Hams on 09/11/2009   Method used:   Printed then faxed to ...       [facility] [phone]* (retail)       [address]       [address]       [place], [place]  [address]       Ph: [phone]       Fax: [phone]   RxID:   [id number]

## 2010-06-17 NOTE — Progress Notes (Signed)
Summary: refi;;  Phone Note Refill Request Message from:  Fax from Pharmacy on April 13, 2010 9:55 AM  Refills Requested: Medication #1:  VICODIN 5-500 MG TABS 1-2 tablet every 6 hours as needed [facility] - fax [phone] - ph [phone]  Initial call taken by: Okey Regal Spring,  April 13, 2010 9:56 AM    Prescriptions: VICODIN 5-500 MG TABS (HYDROCODONE-ACETAMINOPHEN) 1-2 tablet every 6 hours as needed  #60 x 0   Entered by:   Doristine Devoid CMA   Authorized by:   Neena Rhymes MD   Signed by:   Doristine Devoid CMA on 04/13/2010   Method used:   Printed then faxed to ...       [facility] [phone]* (retail)       [address]       [address]       [place], [place]  [address]       Ph: [phone]       Fax: [phone]   RxID:   [id number]

## 2010-06-17 NOTE — Assessment & Plan Note (Signed)
Summary: 3 month followup on weight loss/kn  Flu Vaccine Consent Questions     Do you have a history of severe allergic reactions to this vaccine? no    Any prior history of allergic reactions to egg and/or gelatin? no    Do you have a sensitivity to the preservative Thimersol? no    Do you have a past history of Guillan-Barre Syndrome? no    Do you currently have an acute febrile illness? no    Have you ever had a severe reaction to latex? no    Vaccine information given and explained to patient? yes    Are you currently pregnant? no    Lot Number:[id number]   Exp Date:11/13/2010   Site Given  Right Deltoid IM    Vital Signs:  Patient profile:   40 year old female Height:      62.50 inches Weight:      330 pounds BMI:     59.61 Pulse rate:   80 / minute BP sitting:   120 / 78  (left arm)  Vitals Entered By: Doristine Devoid CMA (February 15, 2010 11:05 AM) CC: f/u on weight loss and flu shot Comments also has some questions about skelaxin    History of Present Illness: [name] here today for f/u on wt loss.  has lost 5 lbs.  limited exercise due to chronic pain.  will re-enroll in water aerobics.  has been closely watching diet.  having difficulty tolerating Skelaxin due to sore throat, sinus drainage.  called Dr Corliss Skains and was told that they had no other muscle relaxant options.  has taken flexeril three times a day in the past w/out difficulty.  would like to switch back if possible.  Current Medications (verified): 1)  Alprazolam 0.5 Mg Tabs (Alprazolam) .... Take 1 To 2 Tablets By Mouth Once Daily As Needed 2)  Flexeril 10 Mg  Tabs (Cyclobenzaprine Hcl) .... Take One Tablet Every 8 Hours. 3)  Fish Oil   Oil (Fish Oil) .... Take 1 Capsule By Mouth Two Times A Day 4)  Furosemide 40 Mg Tabs (Furosemide) .Marland Kitchen.. 1 Tab By Mouth Daily 5)  Bystolic 5 Mg Tabs (Nebivolol Hcl) .Marland Kitchen.. 1 Tab By Mouth Daily 6)  Vicodin 5-500 Mg Tabs (Hydrocodone-Acetaminophen) .Marland Kitchen.. 1-2 Tablet Every 6  Hours As Needed 7)  Neurontin 600 Mg Tabs (Gabapentin) .... Three Times A Day 8)  Promethazine Hcl 25 Mg Tabs (Promethazine Hcl) .... Take One Tablet Qid As Needed 9)  Cymbalta 60 Mg Cpep (Duloxetine Hcl) .... Take One Tablet Daily 10)  Lipitor 40 Mg Tabs (Atorvastatin Calcium) .... Take One Tablet At Bedtime  Allergies (verified): 1)  ! Aspirin 2)  ! Naprosyn 3)  ! Niacin 4)  ! Doxycycline 5)  ! Bactrim 6)  ! Keflex 7)  ! Sulfa 8)  ! Lisinopril 9)  ! Steri-Strip (Adhesive Bandages) 10)  ! Benzoin Compound (Benzoin Compound)  Past History:  Past Medical History: Last updated: 08/17/2009 OBESITY (ICD-278.00) HYPERLIPIDEMIA (ICD-272.4) FIBROMYALGIA (ICD-729.1) HYPERTENSION (ICD-401.9) ANXIETY carpal tunnel R ankle fx ([facility]) recurrent MRSA  Review of Systems      See HPI  Physical Exam  General:  obese, alert and well-developed.  Psych:  Cognition and judgment appear intact. Alert and cooperative with normal attention span and concentration. No apparent delusions, illusions, hallucinations   Impression & Recommendations:  Problem # 1:  OBESITY (ICD-278.00) Assessment Unchanged pt has lost 5 lbs in 3 months.  this is 1st  time in over a year that pt hasn't gained wt between visits.  pt very excited by this.  encouraged increased activity as able and continued attention to healthy diet.  will continue to follow.  Problem # 2:  FIBROMYALGIA (ICD-729.1) Assessment: Unchanged pt would like to switch from Skelaxin to Flexeril.  will make the switch for pt. The following medications were removed from the medication list:    Skelaxin 800 Mg Tabs (Metaxalone) .Marland Kitchen..Marland Kitchen Two times a day Her updated medication list for this problem includes:    Flexeril 10 Mg Tabs (Cyclobenzaprine hcl) .Marland Kitchen... Take one tablet every 8 hours.    Vicodin 5-500 Mg Tabs (Hydrocodone-acetaminophen) .Marland Kitchen... 1-2 tablet every 6 hours as needed  Complete Medication List: 1)  Alprazolam 0.5 Mg Tabs  (Alprazolam) .... Take 1 to 2 tablets by mouth once daily as needed 2)  Flexeril 10 Mg Tabs (Cyclobenzaprine hcl) .... Take one tablet every 8 hours. 3)  Fish Oil Oil (Fish oil) .... Take 1 capsule by mouth two times a day 4)  Furosemide 40 Mg Tabs (Furosemide) .Marland Kitchen.. 1 tab by mouth daily 5)  Bystolic 5 Mg Tabs (Nebivolol hcl) .Marland Kitchen.. 1 tab by mouth daily 6)  Vicodin 5-500 Mg Tabs (Hydrocodone-acetaminophen) .Marland Kitchen.. 1-2 tablet every 6 hours as needed 7)  Neurontin 600 Mg Tabs (Gabapentin) .... Three times a day 8)  Promethazine Hcl 25 Mg Tabs (Promethazine hcl) .... Take one tablet qid as needed 9)  Cymbalta 60 Mg Cpep (Duloxetine hcl) .... Take one tablet daily 10)  Lipitor 40 Mg Tabs (Atorvastatin calcium) .... Take one tablet at bedtime  Other Orders: Admin 1st Vaccine (02725) Flu Vaccine 67yrs + (36644)  Patient Instructions: 1)  Follow up in 3 months to recheck weight and cholesterol- do not eat before this appt 2)  Stop the Lipitor and start the Red Yeast Rice- we'll see where things are in 3 months 3)  STOP the Skelaxin.  START the Flexeril 4)  Continue to watch your diet and try and exercise as able 5)  I'm so proud of your 5 pounds!!!! 6)  Call with any questions or concerns 7)  Have a great holiday season!!! Prescriptions: ALPRAZOLAM 0.5 MG TABS (ALPRAZOLAM) Take 1 to 2 tablets by mouth once daily as needed  #30 x 0   Entered and Authorized by:   Neena Rhymes MD   Signed by:   Neena Rhymes MD on 02/15/2010   Method used:   Print then Give to Patient   RxID:   [id number] FLEXERIL 10 MG  TABS (CYCLOBENZAPRINE HCL) Take one tablet every 8 hours.  #90 x 3   Entered and Authorized by:   Neena Rhymes MD   Signed by:   Neena Rhymes MD on 02/15/2010   Method used:   Print then Give to Patient   RxID:   [id number]

## 2010-06-17 NOTE — Progress Notes (Signed)
Summary: Elna Breslow APPROVEDfor Bystolic//MEDICAID  Phone Note Call from Patient   Caller: Patient Summary of Call: pt called says she needs a prior authorization filled out for [facility], she now has Medicaid and pharmacy said she needs a Prior Auth  I use [employer],  Initial call taken by: Kandice Hams,  May 19, 2009 4:10 PM  Follow-up for Phone Call        Called and spoke with pharmacist who tried to run rx and rejected, says it is the 1st onew year, pt needs to bring in [place] Medicaid card and they will run it if it rejects they will fax for for prior auth .Kandice Hams  May 19, 2009 4:20 PM  Follow-up by: Kandice Hams,  May 19, 2009 4:20 PM  Additional Follow-up for Phone Call Additional follow up Details #1::        Spokew ith pharmacist contact # for [facility] #[phone] Additional Follow-up by: Kandice Hams,  May 25, 2009 3:06 PM    Additional Follow-up for Phone Call Additional follow up Details #2::    PRIOR AUTH APPROVED FOR 1 YEAR 05/25/10 [facility] INFORMED and pt informed .Kandice Hams  May 25, 2009 3:35 PM  Follow-up by: Kandice Hams,  May 25, 2009 3:37 PM

## 2010-06-17 NOTE — Progress Notes (Signed)
Summary: refill  Phone Note Refill Request Message from:  Fax from Pharmacy on November 30, 2009 9:05 AM  Refills Requested: Medication #1:  ALPRAZOLAM 0.5 MG TABS Take 1 to 2 tablets by mouth once daily as needed   Dosage confirmed as above?Dosage Confirmed  Medication #2:  VICODIN 5-500 MG TABS 1-2 tablet every 6 hours as needed   Dosage confirmed as above?Dosage Confirmed [facility] - fax [phone] - tel [phone]  Initial call taken by: Okey Regal Spring,  November 30, 2009 9:06 AM  Follow-up for Phone Call        Alprazolam- ok for #30, no refills  Vicodin- ok for #60, no refills  Please document the last fill date and # given prior to sending me the note Follow-up by: Neena Rhymes MD,  November 30, 2009 10:38 AM    Prescriptions: VICODIN 5-500 MG TABS (HYDROCODONE-ACETAMINOPHEN) 1-2 tablet every 6 hours as needed  #60 x 0   Entered by:   Kathrynn Speed CMA   Authorized by:   Neena Rhymes MD   Signed by:   Kathrynn Speed CMA on 11/30/2009   Method used:   Telephoned to ...       [facility] [phone]* (retail)       [address]       [address]       [place], [place]  [address]       Ph: [phone]       Fax: [phone]   RxID:   [id number] ALPRAZOLAM 0.5 MG TABS (ALPRAZOLAM) Take 1 to 2 tablets by mouth once daily as needed  #30 x 0   Entered by:   Kathrynn Speed CMA   Authorized by:   Neena Rhymes MD   Signed by:   Kathrynn Speed CMA on 11/30/2009   Method used:   Telephoned to ...       [facility] [phone]* (retail)       [address]       [address]       [place], [place]  [address]       Ph: [phone]       Fax: [phone]   RxID:   [id number]

## 2010-06-17 NOTE — Progress Notes (Signed)
Summary: promethazine refill   Phone Note Refill Request Message from:  Fax from Pharmacy on January 06, 2010 8:48 AM  Refills Requested: Medication #1:  PROMETHAZINE HCL 25 MG TABS take one tablet qid as needed Kyla Balzarine [id number]  Initial call taken by: Okey Regal Spring,  January 06, 2010 8:49 AM    Prescriptions: PROMETHAZINE HCL 25 MG TABS (PROMETHAZINE HCL) take one tablet qid as needed  #30 x 0   Entered by:   Doristine Devoid CMA   Authorized by:   Neena Rhymes MD   Signed by:   Doristine Devoid CMA on 01/06/2010   Method used:   Electronically to        [facility] [phone]* (retail)       [name]       [address]       [place], [place]  [address]       Ph: [phone]       Fax: [phone]   RxID:   [id number]

## 2010-06-17 NOTE — Assessment & Plan Note (Signed)
Summary: 3 MONTH FOLLOWUP AND FASTING LABS///[facility]   Vital Signs:  Patient profile:   40 year old female Weight:      348 pounds BMI:     62.86 Pulse rate:   117 / minute BP sitting:   128 / 80  (left arm)  Vitals Entered By: Doristine Devoid CMA (April 27, 2010 9:44 AM) CC: 3 month roa    History of Present Illness: 40 yo woman here today for  1) Depression- very upset about her weight today.  feeling overwhelmed, 'on edge- all the time'  on cymbalta.  'short tempered'.  is looking into gastric bypass w/ [facility] but having difficulty w/ insurance.  no longer in counseling.  2) obesity- has gained 18 lbs since last visit.  reports eating healthy foods, drinking water.  limited exercise due to chronic pain.  has not seen a nutritionist previously due to insurance limitations.  3) fibromyalgia- pain is diffuse.  taking vicodin.  saw Dr Murray Hodgkins at [facility].  was told that they didn't know why she was having pain.  having difficulty sleeping due to pain.  4) Hyperlipidemia- due for lab recheck today.  tolerating lipitor w/out difficulty.  Current Medications (verified): 1)  Alprazolam 0.5 Mg Tabs (Alprazolam) .... Take 1 To 2 Tablets By Mouth Once Daily As Needed 2)  Flexeril 10 Mg  Tabs (Cyclobenzaprine Hcl) .... Take One Tablet Every 8 Hours. 3)  Fish Oil   Oil (Fish Oil) .... Take 1 Capsule By Mouth Two Times A Day 4)  Furosemide 40 Mg Tabs (Furosemide) .Marland Kitchen.. 1 Tab By Mouth Daily 5)  Bystolic 5 Mg Tabs (Nebivolol Hcl) .Marland Kitchen.. 1 Tab By Mouth Daily 6)  Vicodin 5-500 Mg Tabs (Hydrocodone-Acetaminophen) .Marland Kitchen.. 1-2 Tablet Every 6 Hours As Needed 7)  Neurontin 600 Mg Tabs (Gabapentin) .... Three Times A Day 8)  Promethazine Hcl 25 Mg Tabs (Promethazine Hcl) .... Take One Tablet Qid As Needed 9)  Cymbalta 60 Mg Cpep (Duloxetine Hcl) .... Take One Tablet Daily 10)  Lipitor 40 Mg Tabs (Atorvastatin Calcium) .... Take One Tablet At Bedtime  Allergies (verified): 1)  !  Aspirin 2)  ! Naprosyn 3)  ! Niacin 4)  ! Doxycycline 5)  ! Bactrim 6)  ! Keflex 7)  ! Sulfa 8)  ! Lisinopril 9)  ! Steri-Strip (Adhesive Bandages) 10)  ! Benzoin Compound (Benzoin Compound)  Past History:  Past medical, surgical, family and social histories (including risk factors) reviewed, and no changes noted (except as noted below).  Past Medical History: Reviewed history from 08/17/2009 and no changes required. OBESITY (ICD-278.00) HYPERLIPIDEMIA (ICD-272.4) FIBROMYALGIA (ICD-729.1) HYPERTENSION (ICD-401.9) ANXIETY carpal tunnel R ankle fx ([facility]) recurrent MRSA  Past Surgical History: Reviewed history from 08/17/2009 and no changes required. Cholecystectomy Hysterectomy stress test 01/2007 cardiac cath 01/2007 [facility] nml 01/2007 bilateral carpal tunnel release  Family History: Reviewed history from 12/20/2007 and no changes required. Family History Diabetes 1st degree relative Family History Hypertension father: CAD, CABG age 70 mother: DM siblings:healthy MGM: breast cancer  Social History: Reviewed history from 08/17/2009 and no changes required. Divorced with 1 child age 19 Never Smoked Drug use-no Regular exercise- no Alcohol use-no  Review of Systems      See HPI  Physical Exam  General:  obese, alert and well-developed. tearful Head:  Normocephalic and atraumatic without obvious abnormalities. No apparent alopecia or balding. Lungs:  normal respiratory effort, no intercostal retractions, no accessory muscle use, and normal breath sounds.  Heart:  Normal rate and regular rhythm. S1 and S2 normal without gallop, murmur, click, rub or other extra sounds. Abdomen:  Bowel sounds positive,abdomen soft and non-tender without masses, organomegaly or hernias noted. Skin:  turgor normal and color normal.   Cervical Nodes:  No lymphadenopathy noted Psych:  tearful, obviously upset   Impression & Recommendations:  Problem # 1:  ANXIETY  (ICD-300.00) Assessment Deteriorated increase cymbalta to max dose of 120mg .  strongly encouraged her to resume counseling. Her updated medication list for this problem includes:    Alprazolam 0.5 Mg Tabs (Alprazolam) .Marland Kitchen... Take 1 to 2 tablets by mouth once daily as needed    Cymbalta 60 Mg Cpep (Duloxetine hcl) .Marland Kitchen... Take 2 tablets daily  Problem # 2:  OBESITY (ICD-278.00) Assessment: Deteriorated continues to gain weight.  this is cause of pt's anxiety and depression.  looking into gastric bypass which i support at this time.  will refer to nutrition Orders: Nutrition Referral (Nutrition) TLB-BMP (Basic Metabolic Panel-BMET) (80048-METABOL)  Problem # 3:  FIBROMYALGIA (ICD-729.1) Assessment: Deteriorated this is now impacting pt's ability to sleep.  start ambien as directed. Her updated medication list for this problem includes:    Flexeril 10 Mg Tabs (Cyclobenzaprine hcl) .Marland Kitchen... Take one tablet every 8 hours.    Vicodin 5-500 Mg Tabs (Hydrocodone-acetaminophen) .Marland Kitchen... 1-2 tablet every 6 hours as needed  Problem # 4:  HYPERLIPIDEMIA (ICD-272.4) Assessment: Unchanged due for labs.  adjust meds as needed. Her updated medication list for this problem includes:    Crestor 20 Mg Tabs (Rosuvastatin calcium) .Marland Kitchen... Take one tablet at bedtime  Orders: Venipuncture (40981) TLB-Lipid Panel (80061-LIPID) TLB-Hepatic/Liver Function Pnl (80076-HEPATIC)  Complete Medication List: 1)  Alprazolam 0.5 Mg Tabs (Alprazolam) .... Take 1 to 2 tablets by mouth once daily as needed 2)  Flexeril 10 Mg Tabs (Cyclobenzaprine hcl) .... Take one tablet every 8 hours. 3)  Fish Oil Oil (Fish oil) .... Take 1 capsule by mouth two times a day 4)  Furosemide 40 Mg Tabs (Furosemide) .Marland Kitchen.. 1 tab by mouth daily 5)  Bystolic 5 Mg Tabs (Nebivolol hcl) .Marland Kitchen.. 1 tab by mouth daily 6)  Vicodin 5-500 Mg Tabs (Hydrocodone-acetaminophen) .Marland Kitchen.. 1-2 tablet every 6 hours as needed 7)  Neurontin 600 Mg Tabs (Gabapentin) ....  Three times a day 8)  Promethazine Hcl 25 Mg Tabs (Promethazine hcl) .... Take one tablet qid as needed 9)  Cymbalta 60 Mg Cpep (Duloxetine hcl) .... Take 2 tablets daily 10)  Crestor 20 Mg Tabs (Rosuvastatin calcium) .... Take one tablet at bedtime 11)  Ambien 10 Mg Tabs (Zolpidem tartrate) .Marland Kitchen.. 1 tab by mouth at bedtime as needed for sleep 12)  Azithromycin 250 Mg Tabs (Azithromycin) .... 2 by  mouth today and then 1 daily for 4 days  Patient Instructions: 1)  Follow up in 1 month to recheck weight 2)  We'll notify you of your lab results 3)  Someone will call you with your nutrition appt 4)  Increase your Cymbalta to 120mg  daily (2 pills) 5)  Continue the Neurontin at the current dose- that is the max 6)  Use the Ambien no more than 3x/week as needed for sleep 7)  Try and remain active- this will improve mood, pain, and weight 8)  Call with any questions or concerns 9)  Happy Holidays! Prescriptions: AMBIEN 10 MG TABS (ZOLPIDEM TARTRATE) 1 tab by mouth at bedtime as needed for sleep  #30 x 0   Entered and Authorized by:   Natalia Leatherwood  Beverely Low MD   Signed by:   Neena Rhymes MD on 04/27/2010   Method used:   Print then Give to Patient   RxID:   [id number] CYMBALTA 60 MG CPEP (DULOXETINE HCL) take 2 tablets daily  #60 x 6   Entered and Authorized by:   Neena Rhymes MD   Signed by:   Neena Rhymes MD on 04/27/2010   Method used:   Electronically to        [facility] [address] [phone]* (retail)       [address]       [address]       [place], [place]  [address]       Ph: [phone]       Fax: [phone]   RxID:   [id number]    Orders Added: 1)  Venipuncture [47425] 2)  Nutrition Referral [Nutrition] 3)  TLB-Lipid Panel [80061-LIPID] 4)  TLB-Hepatic/Liver Function Pnl [80076-HEPATIC] 5)  TLB-BMP (Basic Metabolic Panel-BMET) [80048-METABOL] 6)  Est. Patient Level IV [[address]]

## 2010-06-17 NOTE — Progress Notes (Signed)
Summary: hydrocodone rx  Phone Note Refill Request Call back at Home Phone [phone] Message from:  Patient  Refills Requested: Medication #1:  VICODIN 5-500 MG TABS 1-2 tablet every 6 hours as needed Initial call taken by: Kandice Hams,  July 21, 2009 3:33 PM  Follow-up for Phone Call        last refill #60 x 0 on 06/23/09, last ov 06/30/09 .Kandice Hams  July 21, 2009 3:39 PM  Follow-up by: Kandice Hams,  July 21, 2009 3:39 PM  Additional Follow-up for Phone Call Additional follow up Details #1::        ok 60, no RF Jose E. Paz MD  July 21, 2009 5:12 PM     Prescriptions: VICODIN 5-500 MG TABS (HYDROCODONE-ACETAMINOPHEN) 1-2 tablet every 6 hours as needed  #60 x 0   Entered by:   Kandice Hams   Authorized by:   Nolon Rod. Paz MD   Signed by:   Kandice Hams on 07/22/2009   Method used:   Printed then faxed to ...       [facility] [address] [phone]* (retail)       [address]       [place], [place]  [address]       Ph: [phone]       Fax: [phone]   RxID:   [id number]

## 2010-06-17 NOTE — Assessment & Plan Note (Signed)
Summary: 1 MO RETURN, REPEAT LFTS PRIOR/RH..........Marland Kitchen   Vital Signs:  Patient profile:   40 year old female Weight:      347 pounds BMI:     62.68 Pulse rate:   72 / minute BP sitting:   126 / 78  (left arm)  Vitals Entered By: Doristine Devoid CMA (June 02, 2010 2:22 PM) CC: f/u on weight    History of Present Illness: 40 yo woman here today for f/u on   1) weight- had 1st nutrition appt.  has f/u next week.  found the visit helpful, enjoyed the nutritionist.  is trying to implement ideas into lifestyle.  drinking more water.  2) depression- this has improved since changing Cymbalta dose.  pt admits to feeling unworthy b/c of weight.  has a lot of emotional issues tied to weight and eating.  denies SI/HI, much less tearful.  admits to emotional eating.  will withdraw from people and retreat to food.  Current Medications (verified): 1)  Alprazolam 0.5 Mg Tabs (Alprazolam) .... Take 1 To 2 Tablets By Mouth Once Daily As Needed 2)  Flexeril 10 Mg  Tabs (Cyclobenzaprine Hcl) .... Take One Tablet Every 8 Hours. 3)  Fish Oil   Oil (Fish Oil) .... Take 1 Capsule By Mouth Two Times A Day 4)  Furosemide 40 Mg Tabs (Furosemide) .Marland Kitchen.. 1 Tab By Mouth Daily 5)  Bystolic 5 Mg Tabs (Nebivolol Hcl) .Marland Kitchen.. 1 Tab By Mouth Daily 6)  Vicodin 5-500 Mg Tabs (Hydrocodone-Acetaminophen) .Marland Kitchen.. 1-2 Tablet Every 6 Hours As Needed 7)  Neurontin 600 Mg Tabs (Gabapentin) .... Three Times A Day 8)  Promethazine Hcl 25 Mg Tabs (Promethazine Hcl) .... Take One Tablet Qid As Needed 9)  Cymbalta 60 Mg Cpep (Duloxetine Hcl) .... Take 2 Tablets Daily 10)  Crestor 20 Mg Tabs (Rosuvastatin Calcium) .... Take One Tablet At Bedtime 11)  Ambien 10 Mg Tabs (Zolpidem Tartrate) .... Take 1 Tab Every Other Night  Allergies (verified): 1)  ! Aspirin 2)  ! Naprosyn 3)  ! Niacin 4)  ! Doxycycline 5)  ! Bactrim 6)  ! Keflex 7)  ! Sulfa 8)  ! Lisinopril 9)  ! Steri-Strip (Adhesive Bandages) 10)  ! Benzoin Compound (Benzoin  Compound)  Past History:  Past medical, surgical, family and social histories (including risk factors) reviewed for relevance to current acute and chronic problems.  Past Medical History: Reviewed history from 08/17/2009 and no changes required. OBESITY (ICD-278.00) HYPERLIPIDEMIA (ICD-272.4) FIBROMYALGIA (ICD-729.1) HYPERTENSION (ICD-401.9) ANXIETY carpal tunnel R ankle fx ([facility]) recurrent MRSA  Past Surgical History: Reviewed history from 08/17/2009 and no changes required. Cholecystectomy Hysterectomy stress test 01/2007 cardiac cath 01/2007 ECHo nml 01/2007 bilateral carpal tunnel release  Family History: Reviewed history from 12/20/2007 and no changes required. Family History Diabetes 1st degree relative Family History Hypertension father: CAD, CABG age 34 mother: DM siblings:healthy MGM: breast cancer  Social History: Reviewed history from 08/17/2009 and no changes required. Divorced with 1 child age 28 Never Smoked Drug use-no Regular exercise- no Alcohol use-no  Review of Systems      See HPI  Physical Exam  General:  obese, alert and well-developed. Psych:  Oriented X3, memory intact for recent and remote, normally interactive, good eye contact, and not anxious appearing.     Impression & Recommendations:  Problem # 1:  OBESITY (ICD-278.00) Assessment Unchanged discussed pt's nutrition visit and also reviewed some tools she can use in regards to eating.  should ask herself 1) am  i hungry? 2) if i am hungry, will i feel bad about eating this?  will also attempt to pre-package things into smaller bags and containers after shopping to cut down on portion sizes.  spent 28 minutes w/ pt, >50% spent counseling.  Problem # 2:  ANXIETY (ICD-300.00) pt's mood has improved since increasing Cymbalta.  has a lot of emotional issues tied to food.  encouraged counseling to address these.  will follow closely. Her updated medication list for this problem  includes:    Alprazolam 0.5 Mg Tabs (Alprazolam) .Marland Kitchen... Take 1 to 2 tablets by mouth once daily as needed    Cymbalta 60 Mg Cpep (Duloxetine hcl) .Marland Kitchen... Take 2 tablets daily  Complete Medication List: 1)  Alprazolam 0.5 Mg Tabs (Alprazolam) .... Take 1 to 2 tablets by mouth once daily as needed 2)  Flexeril 10 Mg Tabs (Cyclobenzaprine hcl) .... Take one tablet every 8 hours. 3)  Fish Oil Oil (Fish oil) .... Take 1 capsule by mouth two times a day 4)  Furosemide 40 Mg Tabs (Furosemide) .Marland Kitchen.. 1 tab by mouth daily 5)  Bystolic 5 Mg Tabs (Nebivolol hcl) .Marland Kitchen.. 1 tab by mouth daily 6)  Vicodin 5-500 Mg Tabs (Hydrocodone-acetaminophen) .Marland Kitchen.. 1-2 tablet every 6 hours as needed 7)  Neurontin 600 Mg Tabs (Gabapentin) .... Three times a day 8)  Promethazine Hcl 25 Mg Tabs (Promethazine hcl) .... Take one tablet qid as needed 9)  Cymbalta 60 Mg Cpep (Duloxetine hcl) .... Take 2 tablets daily 10)  Crestor 20 Mg Tabs (Rosuvastatin calcium) .... Take one tablet at bedtime 11)  Ambien 10 Mg Tabs (Zolpidem tartrate) .... Take 1 tab every other night  Patient Instructions: 1)  Follow up in 1 month to recheck your weight 2)  Ask yourself 'am i hungry?' before you eat 3)  If you are hungry, ask yourself 'will i feel good about this?' 4)  Try and split things into smaller sizes as soon as you buy it so that you can just grab things and go 5)  Hang in there!! Prescriptions: VICODIN 5-500 MG TABS (HYDROCODONE-ACETAMINOPHEN) 1-2 tablet every 6 hours as needed  #60 x 0   Entered and Authorized by:   Neena Rhymes MD   Signed by:   Neena Rhymes MD on 06/02/2010   Method used:   Print then Give to Patient   RxID:   [id number]    Orders Added: 1)  Est. Patient Level IV [[address]]

## 2010-06-17 NOTE — Progress Notes (Signed)
Summary: HYDROCODONE  / APAP REFILL  Phone Note Refill Request Message from:  Fax from Pharmacy on February 18, 2010 2:27 PM  Refills Requested: Medication #1:  VICODIN 5-500 MG TABS 1-2 tablet every 6 hours as needed [address], Valinda Hoar [phone]     Initial call taken by: Jerolyn Shin,  February 18, 2010 2:28 PM    Prescriptions: VICODIN 5-500 MG TABS (HYDROCODONE-ACETAMINOPHEN) 1-2 tablet every 6 hours as needed  #60 x 0   Entered by:   Doristine Devoid CMA   Authorized by:   Neena Rhymes MD   Signed by:   Doristine Devoid CMA on 02/18/2010   Method used:   Printed then faxed to ...       [facility] [phone]* (retail)       [address]       [address]       [place], [place]  [address]       Ph: [phone]       Fax: [phone]   RxID:   [id number] ALPRAZOLAM 0.5 MG TABS (ALPRAZOLAM) Take 1 to 2 tablets by mouth once daily as needed  #30 x 0   Entered by:   Doristine Devoid CMA   Authorized by:   Neena Rhymes MD   Signed by:   Doristine Devoid CMA on 02/18/2010   Method used:   Printed then faxed to ...       [facility] [phone]* (retail)       [address]       [address]       [place], [place]  [address]       Ph: [phone]       Fax: [phone]   RxID:   [id number]

## 2010-06-17 NOTE — Progress Notes (Signed)
Summary: Prior Auth-Lipitor Joana Reamer Request-approved   Phone Note Refill Request Message from:  Pharmacy on January 04, 2010 10:59 AM  Refills Requested: Medication #1:  ALPRAZOLAM 0.5 MG TABS Take 1 to 2 tablets by mouth once daily as needed   Dosage confirmed as above?Dosage Confirmed   Supply Requested: 1 month   Last Refilled: 12/04/2009  Medication #2:  LIPITOR 40 MG TABS take one tablet at bedtime.   Dosage confirmed as above?Dosage Confirmed   Last Refilled: 01/01/2010 Lipitor needs prior auth: [id number].   ID: [id number]  Next Appointment Scheduled: 9.22.11 Initial call taken by: Harold Barban,  January 04, 2010 10:59 AM  Follow-up for Phone Call        Patient has already tried Simcor and Simvastatin, most likely it will be easy to get this prior authorization approved. Ins company is closed now, must try back later. Follow-up by: Lucious Groves CMA,  January 06, 2010 5:08 PM  Additional Follow-up for Phone Call Additional follow up Details #1::        called awaiting form to be faxed to start prior auth....Marland KitchenMarland KitchenDoristine Devoid CMA  January 07, 2010 2:47 PM   information completed and faxed awaiting approval status.........Marland KitchenDoristine Devoid CMA  January 08, 2010 4:21 PM   received approval medication approved for 1 yr. information faxed to pt pharmacy ........Marland KitchenDoristine Devoid CMA  January 11, 2010 1:48 PM

## 2010-06-17 NOTE — Assessment & Plan Note (Signed)
Summary: cpx//pt will be fasting//lch   Vital Signs:  Patient profile:   40 year old female Height:      62.50 inches Weight:      325 pounds BMI:     58.71 Pulse rate:   70 / minute BP sitting:   130 / 80  (left arm)  Vitals Entered By: Doristine Devoid (November 03, 2009 10:05 AM) CC: CPX AND LAB    History of Present Illness: 20 yo woman here today for CPE.  GYNLily Peer.  fibromyalgias/neuropathy- is applying for disability.  needs a letter.  was told by Rheum that they were unable to write letter.  pt in tears b/c she is a financial burden to family.  Preventive Screening-Counseling & Management  Alcohol-Tobacco     Alcohol drinks/day: 0     Smoking Status: never  Caffeine-Diet-Exercise     Diet Comments: not following particular diet     Does Patient Exercise: no      Sexual History:  currently monogamous.        Drug Use:  never.    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) 4)  Uri  (ICD-465.9) 5)  Cellulitis and Abscess of Trunk  (ICD-682.2) 6)  Paresthesia, Hands  (ICD-782.0) 7)  Rhinitis  (ICD-477.9) 8)  Leg Pain, Bilateral  (ICD-729.5) 9)  Skin Lesions, Multiple  (ICD-709.9) 10)  Fatigue  (ICD-780.79) 11)  Headache  (ICD-784.0) 12)  Nausea  (ICD-787.02) 13)  Ingrown Toenail, Infected  (ICD-703.0) 14)  Paronychia, Right Great Toe  (ICD-681.11) 15)  Adverse Drug Reaction  (ICD-995.20) 16)  Peripheral Edema  (ICD-782.3) 17)  Obesity  (ICD-278.00) 18)  Cellulitis  (ICD-682.9) 19)  Hyperlipidemia  (ICD-272.4) 20)  Joint Pain, Lower Leg  (ICD-719.46) 21)  Family History Diabetes 1st Degree Relative  (ICD-V18.0) 22)  Fibromyalgia  (ICD-729.1) 23)  Hypertension  (ICD-401.9) 24)  Anxiety  (ICD-300.00)  Current Medications (verified): 1)  Alprazolam 0.5 Mg Tabs (Alprazolam) .... Take 1 To 2 Tablets By Mouth Once Daily As Needed 2)  Flexeril 10 Mg  Tabs  (Cyclobenzaprine Hcl) .... Take One Tablet Every 8 Hours. 3)  Fish Oil   Oil (Fish Oil) .... Take 1 Capsule By Mouth Two Times A Day 4)  Furosemide 40 Mg Tabs (Furosemide) .Marland Kitchen.. 1 Tab By Mouth Daily 5)  Tramadol Hcl 50 Mg Tabs (Tramadol Hcl) .Marland Kitchen.. 1-2 Tablets Three Times A Day As Needed 6)  Bystolic 5 Mg Tabs (Nebivolol Hcl) .Marland Kitchen.. 1 Tab By Mouth Daily 7)  Vicodin 5-500 Mg Tabs (Hydrocodone-Acetaminophen) .Marland Kitchen.. 1-2 Tablet Every 6 Hours As Needed 8)  Robaxin 500 Mg Tabs (Methocarbamol) .... Take As Needed 9)  Neurontin 600 Mg Tabs (Gabapentin) .... Three Times A Day 10)  Promethazine Hcl 25 Mg Tabs (Promethazine Hcl) .... Take One Tablet Qid As Needed 11)  Clindamycin Hcl 300 Mg Caps (Clindamycin Hcl) .... Take One Tablet Three Times A Day X7 Days 12)  Cymbalta 60 Mg Cpep (Duloxetine Hcl) .... Take One Tablet Daily  Allergies (verified): 1)  ! Aspirin 2)  ! Naprosyn 3)  ! Niacin 4)  ! Doxycycline 5)  ! Bactrim 6)  ! Keflex 7)  ! Sulfa 8)  ! Lisinopril 9)  ! Steri-Strip (Adhesive Bandages) 10)  ! Benzoin Compound (Benzoin Compound)  Past History:  Past Medical History: Last updated: 08/17/2009 OBESITY (ICD-278.00) HYPERLIPIDEMIA (ICD-272.4) FIBROMYALGIA (ICD-729.1) HYPERTENSION (ICD-401.9) ANXIETY carpal tunnel  R ankle fx ([facility]) recurrent MRSA  Past Surgical History: Reviewed history from 08/17/2009 and no changes required. Cholecystectomy Hysterectomy stress test 01/2007 cardiac cath 01/2007 ECHo nml 01/2007 bilateral carpal tunnel release  Past History:  Care Management: Gynecology:Dr. Lily Peer  Family History: Reviewed history from 12/20/2007 and no changes required. Family History Diabetes 1st degree relative Family History Hypertension father: CAD, CABG age 50 mother: DM siblings:healthy MGM: breast cancer  Social History: Reviewed history from 08/17/2009 and no changes required. Divorced with 1 child age 95 Never Smoked Drug use-no Regular  exercise- no Alcohol use-no Does Patient Exercise:  no Sexual History:  currently monogamous Drug Use:  never  Review of Systems  The patient denies anorexia, fever, weight loss, weight gain, vision loss, decreased hearing, hoarseness, chest pain, syncope, dyspnea on exertion, peripheral edema, prolonged cough, headaches, abdominal pain, melena, hematochezia, severe indigestion/heartburn, hematuria, suspicious skin lesions, depression, abnormal bleeding, enlarged lymph nodes, and breast masses.    Physical Exam  General:  obese, alert and well-developed.  Head:  Normocephalic and atraumatic without obvious abnormalities. No apparent alopecia or balding. Eyes:  No corneal or conjunctival inflammation noted. EOMI. Perrla. Funduscopic exam benign, without hemorrhages, exudates or papilledema. Vision grossly normal. Ears:  External ear exam shows no significant lesions or deformities.  Otoscopic examination reveals clear canals, tympanic membranes are intact bilaterally without bulging, retraction, inflammation or discharge. Hearing is grossly normal bilaterally. Nose:  External nasal examination shows no deformity or inflammation. Nasal mucosa are pink and moist without lesions or exudates. Mouth:  Oral mucosa and oropharynx without lesions or exudates.  Teeth in good repair. Neck:  No deformities, masses, or tenderness noted. Breasts:  deferred to gyn Lungs:  normal respiratory effort, no intercostal retractions, no accessory muscle use, and normal breath sounds.   Heart:  Normal rate and regular rhythm. S1 and S2 normal without gallop, murmur, click, rub or other extra sounds. Abdomen:  Bowel sounds positive,abdomen soft and non-tender without masses, organomegaly or hernias noted. Genitalia:  deferred to gyn Pulses:  R and L PT/DP, radial, carotid full and equal Extremities:  trace edema of lower extremities Neurologic:  No cranial nerve deficits noted. Station and gait are normal. Plantar  reflexes are down-going bilaterally. DTRs are symmetrical throughout. Sensory, motor and coordinative functions appear intact. Skin:  Intact without suspicious lesions or rashes Cervical Nodes:  No lymphadenopathy noted Axillary Nodes:  No palpable lymphadenopathy Psych:  Cognition and judgment appear intact. Alert and cooperative with normal attention span and concentration. No apparent delusions, illusions, hallucinations   Impression & Recommendations:  Problem # 1:  PHYSICAL EXAMINATION (ICD-V70.0) Assessment New PE WNL w/ exception of obesity.  strongly encouraged healthy diet and some form of exercise- swimming if joints unable to handle walking.  recent labs reviewed.  due for lipid screen.  UTD on GYN screenings.  anticipatory guidance provided. Orders: Venipuncture (84166) TLB-Lipid Panel (80061-LIPID) TLB-Hepatic/Liver Function Pnl (80076-HEPATIC)  Complete Medication List: 1)  Alprazolam 0.5 Mg Tabs (Alprazolam) .... Take 1 to 2 tablets by mouth once daily as needed 2)  Flexeril 10 Mg Tabs (Cyclobenzaprine hcl) .... Take one tablet every 8 hours. 3)  Fish Oil Oil (Fish oil) .... Take 1 capsule by mouth two times a day 4)  Furosemide 40 Mg Tabs (Furosemide) .Marland Kitchen.. 1 tab by mouth daily 5)  Tramadol Hcl 50 Mg Tabs (Tramadol hcl) .Marland Kitchen.. 1-2 tablets three times a day as needed 6)  Bystolic 5 Mg Tabs (Nebivolol hcl) .Marland Kitchen.. 1 tab by  mouth daily 7)  Vicodin 5-500 Mg Tabs (Hydrocodone-acetaminophen) .Marland Kitchen.. 1-2 tablet every 6 hours as needed 8)  Robaxin 500 Mg Tabs (Methocarbamol) .... Take as needed 9)  Neurontin 600 Mg Tabs (Gabapentin) .... Three times a day 10)  Promethazine Hcl 25 Mg Tabs (Promethazine hcl) .... Take one tablet qid as needed 11)  Clindamycin Hcl 300 Mg Caps (Clindamycin hcl) .... Take one tablet three times a day x7 days 12)  Cymbalta 60 Mg Cpep (Duloxetine hcl) .... Take one tablet daily  Patient Instructions: 1)  Please schedule a follow-up appointment in 3 months to  follow up weight loss. 2)  We'll call you when your letter is ready to pick up 3)  Make healthy food choices and try and get regular exercise 4)  We'll notify you of your lab results 5)  Call with any questions or concerns 6)  Have a great summer!!  Prescriptions: VICODIN 5-500 MG TABS (HYDROCODONE-ACETAMINOPHEN) 1-2 tablet every 6 hours as needed  #60 x 0   Entered and Authorized by:   Neena Rhymes MD   Signed by:   Neena Rhymes MD on 11/03/2009   Method used:   Print then Give to Patient   RxID:   [id number] ALPRAZOLAM 0.5 MG TABS (ALPRAZOLAM) Take 1 to 2 tablets by mouth once daily as needed  #30 x 0   Entered and Authorized by:   Neena Rhymes MD   Signed by:   Neena Rhymes MD on 11/03/2009   Method used:   Print then Give to Patient   RxID:   [id number]

## 2010-06-17 NOTE — Progress Notes (Signed)
Summary: refill request  Phone Note Refill Request Call back at [phone] Message from:  Pharmacy on December 28, 2009 8:15 AM  Refills Requested: Medication #1:  ALPRAZOLAM 0.5 MG TABS Take 1 to 2 tablets by mouth once daily as needed   Dosage confirmed as above?Dosage Confirmed   Supply Requested: 1 month   Last Refilled: 11/30/2009  Medication #2:  VICODIN 5-500 MG TABS 1-2 tablet every 6 hours as needed   Dosage confirmed as above?Dosage Confirmed   Supply Requested: 1 month   Last Refilled: 11/30/2009 [facility] [address]  Next Appointment Scheduled: sept 22nd 2011 Initial call taken by: Lavell Islam,  December 28, 2009 8:16 AM  Follow-up for Phone Call        alprazolam- ok for #30, no refills Vicodin- ok for #60, no refills Follow-up by: Neena Rhymes MD,  December 28, 2009 9:31 AM    Prescriptions: VICODIN 5-500 MG TABS (HYDROCODONE-ACETAMINOPHEN) 1-2 tablet every 6 hours as needed  #60 x 0   Entered by:   Lucious Groves CMA   Authorized by:   Neena Rhymes MD   Signed by:   Lucious Groves CMA on 12/28/2009   Method used:   Telephoned to ...       [facility] [phone]* (retail)       [address]       [address]       [place], [place]  [address]       Ph: [phone]       Fax: [phone]   RxID:   [id number] ALPRAZOLAM 0.5 MG TABS (ALPRAZOLAM) Take 1 to 2 tablets by mouth once daily as needed  #30 x 0   Entered by:   Lucious Groves CMA   Authorized by:   Neena Rhymes MD   Signed by:   Lucious Groves CMA on 12/28/2009   Method used:   Telephoned to ...       [facility] [phone]* (retail)       [address]       [address]       [place], [place]  [address]       Ph: [phone]       Fax: [phone]   RxID:   [id number]

## 2010-06-17 NOTE — Medication Information (Signed)
Summary: Prior Authorization for Lipitor/[place] [employer]  Prior Authorization for Lipitor/[place] [employer]   Imported By: Lanelle Bal 01/19/2010 10:13:30  _____________________________________________________________________  External Attachment:    Type:   Image     Comment:   External Document

## 2010-06-17 NOTE — Progress Notes (Signed)
Summary: refill  Phone Note Refill Request Message from:  Fax from Pharmacy on March 15, 2010 10:45 AM  Refills Requested: Medication #1:  VICODIN 5-500 MG TABS 1-2 tablet every 6 hours as needed  Medication #2:  PROMETHAZINE HCL 25 MG TABS take one tablet qid as needed [facility] [address] - [phone]  Initial call taken by: Okey Regal [place],  March 15, 2010 10:46 AM    Prescriptions: PROMETHAZINE HCL 25 MG TABS (PROMETHAZINE HCL) take one tablet qid as needed  #30 x 0   Entered by:   Doristine Devoid CMA   Authorized by:   Neena Rhymes MD   Signed by:   Doristine Devoid CMA on 03/17/2010   Method used:   Printed then faxed to ...       [facility] [phone]* (retail)       [address]       [address]       [place], [place]  [address]       Ph: [phone]       Fax: [phone]   RxID:   [id number] VICODIN 5-500 MG TABS (HYDROCODONE-ACETAMINOPHEN) 1-2 tablet every 6 hours as needed  #60 x 0   Entered by:   Doristine Devoid CMA   Authorized by:   Neena Rhymes MD   Signed by:   Doristine Devoid CMA on 03/17/2010   Method used:   Printed then faxed to ...       [facility] [phone]* (retail)       [address]       [address]       [place], [place]  [address]       Ph: [phone]       Fax: [phone]   RxID:   [id number]

## 2010-06-17 NOTE — Assessment & Plan Note (Signed)
Summary: 4 week fu/kdc   Vital Signs:  Patient profile:   40 year old female Weight:      323 pounds BP sitting:   120 / 80  (left arm)  Vitals Entered By: Doristine Devoid (Sep 17, 2009 1:35 PM) CC: 4 week f/u    History of Present Illness: 40 yo woman here today to f/u  1) Depression/Anxiety- called today to set up an appt for counseling.  will be going to [facility].  reports feeling less anxious, but now more irritated.  2) R ankle pain- went to rehab and was told there was nothing wrong.  still throbbing.  requiring pain pills.  still swollen.  has not been back to ortho.  3) HTN- BP excellently controlled.  no CP, SOB, HAs, visual changes.  Allergies (verified): 1)  ! Aspirin 2)  ! Naprosyn 3)  ! Niacin 4)  ! Doxycycline 5)  ! Bactrim 6)  ! Keflex 7)  ! Sulfa 8)  ! Lisinopril 9)  ! Steri-Strip (Adhesive Bandages) 10)  ! Benzoin Compound (Benzoin Compound)  Past History:  Past Surgical History: Last updated: 08/17/2009 Cholecystectomy Hysterectomy stress test 01/2007 cardiac cath 01/2007 ECHo nml 01/2007 bilateral carpal tunnel release  Review of Systems General:  Denies fatigue, loss of appetite, malaise, and sleep disorder. Eyes:  Denies blurring and double vision. CV:  Complains of swelling of feet; denies chest pain or discomfort, lightheadness, palpitations, and swelling of hands. Resp:  Denies shortness of breath. GI:  Denies nausea and vomiting. MS:  Complains of joint pain, joint swelling, loss of strength, muscle aches, and muscle weakness; denies joint redness. Neuro:  Denies headaches. Psych:  Complains of anxiety, depression, easily angered, and irritability; denies easily tearful, panic attacks, suicidal thoughts/plans, and thoughts of violence.  Physical Exam  General:  obese, alert and well-developed.  Head:  Normocephalic and atraumatic without obvious abnormalities. No apparent alopecia or balding. Neck:  supple, full ROM Lungs:  normal  respiratory effort, no intercostal retractions, no accessory muscle use, and normal breath sounds.   Heart:  Normal rate and regular rhythm. S1 and S2 normal without gallop, murmur, click, rub or other extra sounds. Msk:  tight heel cord on R, painful to palpation Pulses:  R and L PT/DP, radial, carotid full and equal Extremities:  trace edema of lower extremities Psych:  no tears this visit, acting normally.   Impression & Recommendations:  Problem # 1:  ANXIETY (ICD-300.00) Assessment Unchanged pt has established counseling.  no med changes at this time.  will follow along. Her updated medication list for this problem includes:    Alprazolam 0.5 Mg Tabs (Alprazolam) .Marland Kitchen... Take 1 to 2 tablets by mouth once daily as needed    Venlafaxine Hcl 37.5 Mg Xr24h-tab (Venlafaxine hcl) .Marland Kitchen... 2 tabs daily.  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  given pt's report of severe pain, pain that keeps her awake at night, suggested she call her ortho and re-visit this issue w/ them.  pt plans to call.  Problem # 3:  HYPERTENSION (ICD-401.9) Assessment: Unchanged despite pt's recent depression/anxiety/anger/irritiability BP remains well controlled.  no change in meds at this time.  will follow. Her updated medication list for this problem includes:    Furosemide 40 Mg Tabs (Furosemide) .Marland Kitchen... 1 tab by mouth daily    Bystolic 5 Mg Tabs (Nebivolol hcl) .Marland Kitchen... 1 tab by mouth daily  Complete Medication List: 1)  Alprazolam 0.5 Mg Tabs (Alprazolam) .... Take 1 to 2 tablets by mouth once daily as needed 2)  Flexeril 10 Mg Tabs (Cyclobenzaprine hcl) .... Take one tablet every 8 hours. 3)  Fish Oil Oil (Fish oil) .... Take 1 capsule by mouth two times a day 4)  Furosemide 40 Mg Tabs (Furosemide) .Marland Kitchen.. 1 tab by mouth daily 5)  Tramadol Hcl 50 Mg Tabs (Tramadol hcl) .Marland Kitchen.. 1-2 tablets three times a day as needed 6)  Venlafaxine Hcl  37.5 Mg Xr24h-tab (Venlafaxine hcl) .... 2 tabs daily. 7)  Bystolic 5 Mg Tabs (Nebivolol hcl) .Marland Kitchen.. 1 tab by mouth daily 8)  Vicodin 5-500 Mg Tabs (Hydrocodone-acetaminophen) .Marland Kitchen.. 1-2 tablet every 6 hours as needed 9)  Robaxin 500 Mg Tabs (Methocarbamol) .... Take as needed  Patient Instructions: 1)  Please schedule your complete physical in 6-8 weeks- don't eat before this appt. 2)  Please call Dr Everlean Alstrom about the pain in your foot 3)  Good luck at counseling!  I think it will be really good for you! 4)  Your blood pressure looks great! 5)  HANG IN THERE!!

## 2010-06-17 NOTE — Consult Note (Signed)
Summary: [facility]  [facility]   Imported By: Lanelle Bal 05/24/2010 09:08:12  _____________________________________________________________________  External Attachment:    Type:   Image     Comment:   External Document

## 2010-06-17 NOTE — Progress Notes (Signed)
Summary: Refill Request  Phone Note Refill Request Call back at [phone] Message from:  Pharmacy on May 14, 2010 8:12 AM  Refills Requested: Medication #1:  PROMETHAZINE HCL 25 MG TABS take one tablet qid as needed   Dosage confirmed as above?Dosage Confirmed   Supply Requested: 1 month   Last Refilled: 03/17/2010  Medication #2:  ALPRAZOLAM 0.5 MG TABS Take 1 to 2 tablets by mouth once daily as needed   Dosage confirmed as above?Dosage Confirmed   Supply Requested: 1 month   Last Refilled: 04/22/2010 [facility] [address]   Next Appointment Scheduled: 1.13.12 Initial call taken by: Harold Barban,  May 14, 2010 8:13 AM    Prescriptions: PROMETHAZINE HCL 25 MG TABS (PROMETHAZINE HCL) take one tablet qid as needed  #30 x 0   Entered by:   Doristine Devoid CMA   Authorized by:   Neena Rhymes MD   Signed by:   Doristine Devoid CMA on 05/14/2010   Method used:   Printed then faxed to ...       [facility] [address] [phone]* (retail)       [address]       [address]       [place], [place]  [address]       Ph: [phone]       Fax: [phone]   RxID:   [id number] ALPRAZOLAM 0.5 MG TABS (ALPRAZOLAM) Take 1 to 2 tablets by mouth once daily as needed  #30 x 0   Entered by:   Doristine Devoid CMA   Authorized by:   Neena Rhymes MD   Signed by:   Doristine Devoid CMA on 05/14/2010   Method used:   Printed then faxed to ...       [facility] [address] [phone]* (retail)       [address]       [address]       [place], [place]  [address]       Ph: [phone]       Fax: [phone]   RxID:   [id number]

## 2010-06-17 NOTE — Assessment & Plan Note (Signed)
Summary: sinus infection??/kn   Vital Signs:  Patient profile:   40 year old female Weight:      335.38 pounds Temp:     98.0 degrees F oral Pulse rate:   83 / minute Pulse rhythm:   regular BP sitting:   132 / 80  (left arm) Cuff size:   large  Vitals Entered By: Army Fossa CMA (December 31, 2009 8:56 AM) CC: Sinus infection? Comments pressure in face and teeth and around eyes. minor congestion x 3 days    History of Present Illness: 40 yo woman here today for ? sinus infxn.  sxs started 3 days ago w/ facial pain, pressure, tooth pain, HAs, nasal congestion.  minimal cough.  + ear pain bilaterally.  no known sick contacts.  + subjective fevers.  Current Medications (verified): 1)  Alprazolam 0.5 Mg Tabs (Alprazolam) .... Take 1 To 2 Tablets By Mouth Once Daily As Needed 2)  Flexeril 10 Mg  Tabs (Cyclobenzaprine Hcl) .... Take One Tablet Every 8 Hours. 3)  Fish Oil   Oil (Fish Oil) .... Take 1 Capsule By Mouth Two Times A Day 4)  Furosemide 40 Mg Tabs (Furosemide) .Marland Kitchen.. 1 Tab By Mouth Daily 5)  Bystolic 5 Mg Tabs (Nebivolol Hcl) .Marland Kitchen.. 1 Tab By Mouth Daily 6)  Vicodin 5-500 Mg Tabs (Hydrocodone-Acetaminophen) .Marland Kitchen.. 1-2 Tablet Every 6 Hours As Needed 7)  Neurontin 600 Mg Tabs (Gabapentin) .... Three Times A Day 8)  Promethazine Hcl 25 Mg Tabs (Promethazine Hcl) .... Take One Tablet Qid As Needed 9)  Clindamycin Hcl 300 Mg Caps (Clindamycin Hcl) .... Take One Tablet Three Times A Day X7 Days 10)  Cymbalta 60 Mg Cpep (Duloxetine Hcl) .... Take One Tablet Daily 11)  Skelaxin 800 Mg Tabs (Metaxalone) .... Two Times A Day  Allergies (verified): 1)  ! Aspirin 2)  ! Naprosyn 3)  ! Niacin 4)  ! Doxycycline 5)  ! Bactrim 6)  ! Keflex 7)  ! Sulfa 8)  ! Lisinopril 9)  ! Steri-Strip (Adhesive Bandages) 10)  ! Benzoin Compound (Benzoin Compound)  Review of Systems      See HPI  Physical Exam  General:  obese, alert and well-developed.  Head:  + TTP over maxillary sinuses Eyes:   no injxn or inflammation Ears:  External ear exam shows no significant lesions or deformities.  Otoscopic examination reveals clear canals, tympanic membranes are intact bilaterally without bulging, retraction, inflammation or discharge. Hearing is grossly normal bilaterally. Nose:  External nasal examination shows no deformity or inflammation. Nasal mucosa are pink and moist without lesions or exudates. Mouth:  Oral mucosa and oropharynx without lesions or exudates.  Teeth in good repair. Lungs:  normal respiratory effort, no intercostal retractions, no accessory muscle use, and normal breath sounds.   Heart:  Normal rate and regular rhythm. S1 and S2 normal without gallop, murmur, click, rub or other extra sounds.   Impression & Recommendations:  Problem # 1:  SINUSITIS - ACUTE-NOS (ICD-461.9) Assessment New pt's sxs consistent w/ sinus infxn.  start amox (which pt has tolerated in the past).  reviewed supportive care and red flags that should prompt return.  Pt expresses understanding and is in agreement w/ this plan. The following medications were removed from the medication list:    Clindamycin Hcl 300 Mg Caps (Clindamycin hcl) .Marland Kitchen... Take one tablet three times a day x7 days Her updated medication list for this problem includes:    Amoxicillin 500 Mg Tabs (Amoxicillin) .Marland KitchenMarland KitchenMarland KitchenMarland Kitchen 2  tabs by mouth two times a day x10 days.  take w/ food.  Problem # 2:  OTHER NONSPECIFIC ABNORMAL SERUM ENZYME LEVELS (ICD-790.5) Assessment: New recheck pt's LFTs given elevation at last check. Orders: Venipuncture ([address]) TLB-Hepatic/Liver Function Pnl (80076-HEPATIC) Specimen Handling ([address])  Complete Medication List: 1)  Alprazolam 0.5 Mg Tabs (Alprazolam) .... Take 1 to 2 tablets by mouth once daily as needed 2)  Flexeril 10 Mg Tabs (Cyclobenzaprine hcl) .... Take one tablet every 8 hours. 3)  Fish Oil Oil (Fish oil) .... Take 1 capsule by mouth two times a day 4)  Furosemide 40 Mg Tabs (Furosemide)  .Marland Kitchen.. 1 tab by mouth daily 5)  Bystolic 5 Mg Tabs (Nebivolol hcl) .Marland Kitchen.. 1 tab by mouth daily 6)  Vicodin 5-500 Mg Tabs (Hydrocodone-acetaminophen) .Marland Kitchen.. 1-2 tablet every 6 hours as needed 7)  Neurontin 600 Mg Tabs (Gabapentin) .... Three times a day 8)  Promethazine Hcl 25 Mg Tabs (Promethazine hcl) .... Take one tablet qid as needed 9)  Cymbalta 60 Mg Cpep (Duloxetine hcl) .... Take one tablet daily 10)  Skelaxin 800 Mg Tabs (Metaxalone) .... Two times a day 11)  Amoxicillin 500 Mg Tabs (Amoxicillin) .... 2 tabs by mouth two times a day x10 days.  take w/ food.  Patient Instructions: 1)  Follow up as scheduled 2)  We'll notify you of your liver tests 3)  Take the Amoxicillin as directed- take w/ food to avoid upset stomach 4)  Plenty of fluids 5)  I'll do your letter this weekend and call you on Monday! 6)  Hang in there!!! Prescriptions: AMOXICILLIN 500 MG TABS (AMOXICILLIN) 2 tabs by mouth two times a day x10 days.  take w/ food.  #40 x 0   Entered and Authorized by:   Neena Rhymes MD   Signed by:   Neena Rhymes MD on 12/31/2009   Method used:   Electronically to        [facility] [address] [phone]* (retail)       [address]       [address]       [place], [place]  [address]       Ph: [phone]       Fax: [phone]   RxID:   [id number]

## 2010-06-17 NOTE — Assessment & Plan Note (Signed)
Summary: fu on meds/kdc   Vital Signs:  Patient profile:   40 year old female Weight:      324 pounds Pulse rate:   70 / minute BP sitting:   130 / 80  (left arm)  Vitals Entered By: Doristine Devoid (August 17, 2009 2:10 PM) CC: f/u on meds    History of Present Illness: 40 yo woman here today for f/u on  1) s/p R ankle fx- seeing Dr Marciano Sequin at [facility], applying for disability.  reports now she has feeling of 'cord pulling' when she walks.  'it's like it's tearing me apart'.  has not had PT for achilles contracture.  2) Depression- Effexor is working but 'not as well as it used to'.  money is a stressor, needs surgery but they won't operate b/c of her wt.  pt was refused disability.  pt tearful in office.  feels she's stuck in a 'vicious cycle'- needs to lose weight but can't and she can't have things done that would help her lose wt b/c of her wt.  3) Doctor issue- pt now on Medicaid.  They are trying to force pt to see [facility] provider.  Pt distraught about this and wants to stay in this practice.  Current Medications (verified): 1)  Alprazolam 0.5 Mg Tabs (Alprazolam) .... Take 1 To 2 Tablets By Mouth Once Daily As Needed 2)  Flexeril 10 Mg  Tabs (Cyclobenzaprine Hcl) .... Take One Tablet Every 8 Hours. 3)  Fish Oil   Oil (Fish Oil) .... Take 1 Capsule By Mouth Two Times A Day 4)  Furosemide 40 Mg Tabs (Furosemide) .Marland Kitchen.. 1 Tab By Mouth Daily 5)  Promethazine Hcl 25 Mg Tabs (Promethazine Hcl) .... Take One Tablet 4 Times A Day As Needed For Nausea. 6)  Tramadol Hcl 50 Mg Tabs (Tramadol Hcl) .Marland Kitchen.. 1-2 Tablets Three Times A Day As Needed 7)  Venlafaxine Hcl 37.5 Mg Xr24h-Tab (Venlafaxine Hcl) .... 2 Tabs Daily. 8)  Bystolic 5 Mg Tabs (Nebivolol Hcl) .Marland Kitchen.. 1 Tab By Mouth Daily 9)  Vicodin 5-500 Mg Tabs (Hydrocodone-Acetaminophen) .Marland Kitchen.. 1-2 Tablet Every 6 Hours As Needed 10)  Robaxin 500 Mg Tabs (Methocarbamol) .... Take As Needed  Allergies (verified): 1)  ! Aspirin 2)  !  Naprosyn 3)  ! Niacin 4)  ! Doxycycline 5)  ! Bactrim 6)  ! Keflex 7)  ! Sulfa 8)  ! Lisinopril 9)  ! Steri-Strip (Adhesive Bandages) 10)  ! Benzoin Compound (Benzoin Compound)  Past History:  Past Medical History: OBESITY (ICD-278.00) HYPERLIPIDEMIA (ICD-272.4) FIBROMYALGIA (ICD-729.1) HYPERTENSION (ICD-401.9) ANXIETY carpal tunnel R ankle fx ([facility]) recurrent MRSA  Past Surgical History: Cholecystectomy Hysterectomy stress test 01/2007 cardiac cath 01/2007 ECHo nml 01/2007 bilateral carpal tunnel release  Social History: Divorced with 1 child age 34 Never Smoked Drug use-no Regular exercise- no Alcohol use-no  Review of Systems      See HPI  Physical Exam  General:  obese, alert and well-developed.  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  will refer to PT so that pt can resume exercising in order to help her wt loss.  Orders: Physical Therapy Referral (PT)  Problem # 2:  OBESITY (ICD-278.00) Assessment: Unchanged pt unable to exercise currently due to problem #1.  stressed importance of healthy diet and activity as able.  will refer to PT to allow pt to overcome tight heel cord and minimize reasons for not exercising.  obesity is playing large role in pt's depression but depression is also feeding pt's obesity.  will continue to encouraged and follow.  Problem # 3:  ANXIETY (ICD-300.00) Assessment: Deteriorated pt more anxious/depressed than before.  strongly encouraged pt to establish counseling to address these issues so she can stop emotionally eating and work on her total health and well being.  Pt expresses understanding and  is in agreement w/ this plan. Her updated medication list for this problem includes:    Alprazolam 0.5 Mg Tabs (Alprazolam) .Marland Kitchen... Take 1 to 2 tablets by mouth once daily as needed    Venlafaxine Hcl 37.5 Mg Xr24h-tab (Venlafaxine hcl) .Marland Kitchen... 2 tabs daily.  Complete Medication List: 1)  Alprazolam 0.5 Mg Tabs (Alprazolam) .... Take 1 to 2 tablets by mouth once daily as needed 2)  Flexeril 10 Mg Tabs (Cyclobenzaprine hcl) .... Take one tablet every 8 hours. 3)  Fish Oil Oil (Fish oil) .... Take 1 capsule by mouth two times a day 4)  Furosemide 40 Mg Tabs (Furosemide) .Marland Kitchen.. 1 tab by mouth daily 5)  Promethazine Hcl 25 Mg Tabs (Promethazine hcl) .... Take one tablet 4 times a day as needed for nausea. 6)  Tramadol Hcl 50 Mg Tabs (Tramadol hcl) .Marland Kitchen.. 1-2 tablets three times a day as needed 7)  Venlafaxine Hcl 37.5 Mg Xr24h-tab (Venlafaxine hcl) .... 2 tabs daily. 8)  Bystolic 5 Mg Tabs (Nebivolol hcl) .Marland Kitchen.. 1 tab by mouth daily 9)  Vicodin 5-500 Mg Tabs (Hydrocodone-acetaminophen) .Marland Kitchen.. 1-2 tablet every 6 hours as needed 10)  Robaxin 500 Mg Tabs (Methocarbamol) .... Take as needed 11)  Clindamycin Hcl 300 Mg Caps (Clindamycin hcl) .... Take one tablet three times a day x7days  Patient Instructions: 1)  Follow up with me in 4-6 weeks to check on mood 2)  Please call [facility] to set up counselling 3)  Someone will call you with your PT referral 4)  I will do what I can for [facility] 5)  Hang in there!

## 2010-06-17 NOTE — Progress Notes (Signed)
Summary: f/u appt  Phone Note Outgoing Call   Summary of Call: I gave Ms Howell 1 refill on Lasix, but please call her and have her arrange a follow up appointment as she is due for f/u. Thanks Initial call taken by: Lemont Fillers FNP,  June 22, 2009 12:33 PM  Follow-up for Phone Call        spoke w/ patient aware f/u due appt scheduled for next week..........Marland KitchenDoristine Devoid  June 23, 2009 4:09 PM

## 2010-06-17 NOTE — Progress Notes (Signed)
Summary: venlafaxine refill   Phone Note Refill Request Message from:  Fax from Pharmacy on August 07, 2009 11:18 AM  Refills Requested: Medication #1:  VENLAFAXINE HCL 37.5 MG XR24H-TAB 2 tabs daily. [facility] [address] fax [phone]   Method Requested: Fax to Local Pharmacy Next Appointment Scheduled: 08/17/2009 Initial call taken by: Barb Merino,  August 07, 2009 11:19 AM    Prescriptions: VENLAFAXINE HCL 37.5 MG XR24H-TAB (VENLAFAXINE HCL) 2 tabs daily.  #60 x 3   Entered by:   Doristine Devoid   Authorized by:   Neena Rhymes MD   Signed by:   Doristine Devoid on 08/07/2009   Method used:   Electronically to        [facility] [address] [phone]* (retail)       [address]       [address]       [place], [place]  [address]       Ph: [phone]       Fax: [phone]   RxID:   [id number]

## 2010-06-17 NOTE — Progress Notes (Signed)
Summary: labs-  Phone Note Outgoing Call   Call placed by: Doristine Devoid,  November 05, 2009 9:13 AM Call placed to: Patient Summary of Call: total cholesterol, LDL, triglycerides all high.  HDL is low.  needs to start medication but given elevated liver enzymes will hold at this time.  should avoid all tylenol and ETOH and recheck LFTs in 2 weeks.    Follow-up for Phone Call        left message on machine .........Marland KitchenDoristine Devoid  November 05, 2009 9:13 AM   spoke w/ patient aware of labs and appt scheduled to rechecked .......Marland KitchenDoristine Devoid  November 05, 2009 9:44 AM

## 2010-06-17 NOTE — Progress Notes (Signed)
Summary: Refill Request  Phone Note Refill Request Call back at [phone] Message from:  Pharmacy on Oct 09, 2009 8:18 AM  Refills Requested: Medication #1:  PROMETHAZINE 25MG  TABLETS, take 1 tablet by mouth four times daily as needed for nausea   Supply Requested: 1 month   Last Refilled: 10/06/2009 [facility] [address]   Next Appointment Scheduled: 6.8.11 Initial call taken by: Harold Barban,  Oct 09, 2009 8:18 AM    New/Updated Medications: PROMETHAZINE HCL 25 MG TABS (PROMETHAZINE HCL) take one tablet qid as needed Prescriptions: PROMETHAZINE HCL 25 MG TABS (PROMETHAZINE HCL) take one tablet qid as needed  #30 x 0   Entered by:   Doristine Devoid   Authorized by:   Neena Rhymes MD   Signed by:   Doristine Devoid on 10/09/2009   Method used:   Electronically to        [facility] [address] [phone]* (retail)       [address]       [address]       [place], [place]  [address]       Ph: [phone]       Fax: [phone]   RxID:   [id number]

## 2010-06-17 NOTE — Progress Notes (Signed)
Summary: labs-  Phone Note Outgoing Call   Call placed by: Doristine Devoid CMA,  April 30, 2010 9:38 AM Call placed to: Patient Summary of Call: cholesterol and triglycerides remain elevated.  should switch to Crestor 20mg  (please give sample if available) but not start this until after rechecking LFTs in 2 weeks.  LFTs are also mildly elevated- they were normal after starting Crestor.  should hold off on tylenol and ETOH for 2 weeks and recheck labs.  Follow-up for Phone Call        left message on machine .......Marland KitchenDoristine Devoid CMA  April 30, 2010 9:38 AM   spoke w/ patient aware of labs and that medication needs to be switch also left samples up front for pick up........Marland KitchenDoristine Devoid CMA  April 30, 2010 10:03 AM     New/Updated Medications: CRESTOR 20 MG TABS (ROSUVASTATIN CALCIUM) take one tablet at bedtime Prescriptions: CRESTOR 20 MG TABS (ROSUVASTATIN CALCIUM) take one tablet at bedtime  #30 x 3   Entered by:   Doristine Devoid CMA   Authorized by:   Neena Rhymes MD   Signed by:   Doristine Devoid CMA on 04/30/2010   Method used:   Electronically to        [facility] [address] [phone]* (retail)       [address]       [address]       [place], [place]  [address]       Ph: [phone]       Fax: [phone]   RxID:   [id number]

## 2010-06-17 NOTE — Progress Notes (Signed)
Summary: vicodin refill--waiting for 12/28 labs  Phone Note Refill Request Message from:  Fax from Pharmacy on May 07, 2010 10:09 AM  Refills Requested: Medication #1:  VICODIN 5-500 MG TABS 1-2 tablet every 6 hours as needed see phone note dated 12/15--waiting for labs; lab appt is 12/28       [facility], [id number], [address], [place], [place]  phone-[phone], fax-[phone]       qty = 60  Next Appointment Scheduled: lab=05/12/2010 Initial call taken by: Jerolyn Shin,  May 07, 2010 10:11 AM  Follow-up for Phone Call        Patient also left msg on voicemail having sinus infection symptoms has facial pain and teeth hurts would like prescription called into pharmacy. Spoke w/ Dr. Beverely Low ok for patient to have prescription for z-pac........Marland KitchenDoristine Devoid CMA  May 07, 2010 3:55 PM     New/Updated Medications: AZITHROMYCIN 250 MG  TABS (AZITHROMYCIN) 2 by  mouth today and then 1 daily for 4 days Prescriptions: VICODIN 5-500 MG TABS (HYDROCODONE-ACETAMINOPHEN) 1-2 tablet every 6 hours as needed  #60 x 0   Entered by:   Doristine Devoid CMA   Authorized by:   Neena Rhymes MD   Signed by:   Doristine Devoid CMA on 05/07/2010   Method used:   Printed then faxed to ...       [facility] [address] [phone]* (retail)       [address]       [address]       [place], [place]  [address]       Ph: [phone]       Fax: [phone]   RxID:   [id number] AZITHROMYCIN 250 MG  TABS (AZITHROMYCIN) 2 by  mouth today and then 1 daily for 4 days  #6 x 0   Entered by:   Doristine Devoid CMA   Authorized by:   Neena Rhymes MD   Signed by:   Doristine Devoid CMA on 05/07/2010   Method used:   Electronically to        [facility] [address] [phone]* (retail)       [address]       [address]       [place], [place]  [address]       Ph: [phone]       Fax: [phone]   RxID:   [id number]

## 2010-06-17 NOTE — Progress Notes (Signed)
Summary: bump under arm-  Phone Note Call from Patient   Caller: Patient Summary of Call: patient left msg on voicemail has another red bump under arm same as previous and would like to know if Dr. Beverely Low would call in antibiotic   [facility] Initial call taken by: Doristine Devoid,  October 20, 2009 8:22 AM  Follow-up for Phone Call        left message on machine to give office a call also infromed that we where going to call in medication needed to inform patient that she will be referred to ID if another recurrence occurs........Marland KitchenDoristine Devoid  October 20, 2009 8:25 AM   spoke w/ patient aware if another occurance will need to refer to specialist.....Marland KitchenMarland KitchenDoristine Devoid  October 20, 2009 8:51 AM     New/Updated Medications: CLINDAMYCIN HCL 300 MG CAPS (CLINDAMYCIN HCL) take one tablet three times a day x7 days Prescriptions: CLINDAMYCIN HCL 300 MG CAPS (CLINDAMYCIN HCL) take one tablet three times a day x7 days  #21 x 0   Entered by:   Doristine Devoid   Authorized by:   Neena Rhymes MD   Signed by:   Doristine Devoid on 10/20/2009   Method used:   Electronically to        [facility] [address] [phone]* (retail)       [address]       [address]       [place], [place]  [address]       Ph: [phone]       Fax: [phone]   RxID:   [id number]

## 2010-06-17 NOTE — Progress Notes (Signed)
Summary: Refill Request//Alprazolam  Phone Note Refill Request Call back at [phone] Message from:  Pharmacy on Oct 06, 2009 10:34 AM  Refills Requested: Medication #1:  VICODIN 5-500 MG TABS 1-2 tablet every 6 hours as needed   Dosage confirmed as above?Dosage Confirmed   Supply Requested: 1 month   Last Refilled: 09/11/2009  Medication #2:  ALPRAZOLAM 0.5 MG TABS Take 1 to 2 tablets by mouth once daily as needed   Dosage confirmed as above?Dosage Confirmed   Supply Requested: 1 month   Last Refilled: 09/08/2009 [facility] [address]  Next Appointment Scheduled: 6.8.11 Initial call taken by: Harold Barban,  Oct 06, 2009 10:34 AM  Follow-up for Phone Call        last ov 09/17/09, last refill #30 x 0 on 09/08/09 .Kandice Hams  Oct 06, 2009 10:42 AM  Follow-up by: Kandice Hams,  Oct 06, 2009 10:42 AM  Additional Follow-up for Phone Call Additional follow up Details #1::        ok for #30, no refills Additional Follow-up by: Neena Rhymes MD,  Oct 06, 2009 10:44 AM    Prescriptions: ALPRAZOLAM 0.5 MG TABS (ALPRAZOLAM) Take 1 to 2 tablets by mouth once daily as needed  #30 x 0   Entered by:   Kandice Hams   Authorized by:   Neena Rhymes MD   Signed by:   Kandice Hams on 10/06/2009   Method used:   Printed then faxed to ...       [facility] Dr[address] [phone]* (retail)       [address]       [address]       [place], [place]  [address]       Ph: [phone]       Fax: [phone]   RxID:   [id number]   Appended Document: Refill Request//Alprazolam    Clinical Lists Changes  Medications: Rx of VICODIN 5-500 MG TABS (HYDROCODONE-ACETAMINOPHEN) 1-2 tablet every 6 hours as needed;  #60 x 0;  Signed;  Entered by: Doristine Devoid;  Authorized by: Neena Rhymes MD;  Method used: Printed then faxed to [facility] [address] [phone]*, [address], [address] [place], [place]  [address], Ph: [phone], Fax: [phone]    Prescriptions: VICODIN  5-500 MG TABS (HYDROCODONE-ACETAMINOPHEN) 1-2 tablet every 6 hours as needed  #60 x 0   Entered by:   Doristine Devoid   Authorized by:   Neena Rhymes MD   Signed by:   Doristine Devoid on 10/06/2009   Method used:   Printed then faxed to ...       [facility] [address] [phone]* (retail)       [address]       [address]       [place], [place]  [address]       Ph: [phone]       Fax: [phone]   RxID:   [id number]

## 2010-06-17 NOTE — Progress Notes (Signed)
Summary: pheragan  Phone Note Refill Request Message from:  Scriptline  Refills Requested: Medication #1:  PROMETHAZINE HCL 25 MG TABS Take one tablet 4 times a day as needed for nausea.   Supply Requested: 1 month [facility] [address]   Method Requested: Electronic Initial call taken by: Benny Lennert CMA Duncan Dull),  June 09, 2009 4:20 PM  Follow-up for Phone Call        I got this refill request in error.Marland KitchenMarland KitchenMelissa are you covering for Dr. Rennis Golden refills? Can you take care of this? Follow-up by: Kerby Nora MD,  June 09, 2009 4:59 PM    Prescriptions: PROMETHAZINE HCL 25 MG TABS (PROMETHAZINE HCL) Take one tablet 4 times a day as needed for nausea.  #30 x 0   Entered by:   Lemont Fillers FNP   Authorized by:   Kerby Nora MD   Signed by:   Lemont Fillers FNP on 06/09/2009   Method used:   Electronically to        [facility] [phone]* (retail)       [address]       [place], [place]  [address]       Ph: [phone]       Fax: [phone]   RxID:   [id number]

## 2010-06-17 NOTE — Progress Notes (Signed)
Summary: Refill Request  Phone Note Refill Request Call back at [phone] Message from:  Pharmacy on February 26, 2010 8:15 AM  Refills Requested: Medication #1:  BYSTOLIC 5 MG TABS 1 tab by mouth daily   Dosage confirmed as above?Dosage Confirmed   Supply Requested: 1 month   Last Refilled: 01/29/2010 [facility] [address]  Next Appointment Scheduled: 12.13.11 Initial call taken by: Harold Barban,  February 26, 2010 8:15 AM    Prescriptions: BYSTOLIC 5 MG TABS (NEBIVOLOL HCL) 1 tab by mouth daily  #30 x 2   Entered by:   Jeremy Johann CMA   Authorized by:   Neena Rhymes MD   Signed by:   Jeremy Johann CMA on 02/26/2010   Method used:   Faxed to ...       [facility] [address] [phone]* (retail)       [address]       [address]       [place], [place]  [address]       Ph: [phone]       Fax: [phone]   RxID:   [id number]

## 2010-06-22 ENCOUNTER — Ambulatory Visit: Payer: Self-pay | Admitting: *Deleted

## 2010-06-22 ENCOUNTER — Telehealth (INDEPENDENT_AMBULATORY_CARE_PROVIDER_SITE_OTHER): Payer: Self-pay | Admitting: *Deleted

## 2010-06-24 ENCOUNTER — Ambulatory Visit (INDEPENDENT_AMBULATORY_CARE_PROVIDER_SITE_OTHER): Payer: Medicaid Other | Admitting: Family Medicine

## 2010-06-24 ENCOUNTER — Encounter: Payer: Self-pay | Admitting: Family Medicine

## 2010-06-24 DIAGNOSIS — J019 Acute sinusitis, unspecified: Secondary | ICD-10-CM

## 2010-06-26 ENCOUNTER — Encounter: Payer: Self-pay | Admitting: Family Medicine

## 2010-07-01 NOTE — Assessment & Plan Note (Signed)
Summary: sinus congestion and headache/cdj   Vital Signs:  Patient profile:   40 year old female Weight:      344 pounds BMI:     62.14 Temp:     98.3 degrees F oral BP sitting:   126 / 82  (left arm)  Vitals Entered By: Doristine Devoid CMA (June 24, 2010 2:46 PM) CC: sinus congestion HA and facial pain   History of Present Illness: 40 yo woman here today for ? sinus infxn.  sxs started w/ R ear pain 5-6 days ago.  now having sinus and chest congestion.  no fever.  nonproductive cough.  + facial pain, tooth pain.  + sick contacts.  Current Medications (verified): 1)  Alprazolam 0.5 Mg Tabs (Alprazolam) .... Take 1 To 2 Tablets By Mouth Once Daily As Needed 2)  Flexeril 10 Mg  Tabs (Cyclobenzaprine Hcl) .... Take One Tablet Every 8 Hours. 3)  Fish Oil   Oil (Fish Oil) .... Take 1 Capsule By Mouth Two Times A Day 4)  Furosemide 40 Mg Tabs (Furosemide) .Marland Kitchen.. 1 Tab By Mouth Daily 5)  Bystolic 5 Mg Tabs (Nebivolol Hcl) .Marland Kitchen.. 1 Tab By Mouth Daily 6)  Vicodin 5-500 Mg Tabs (Hydrocodone-Acetaminophen) .Marland Kitchen.. 1-2 Tablet Every 6 Hours As Needed 7)  Neurontin 600 Mg Tabs (Gabapentin) .... Three Times A Day 8)  Promethazine Hcl 25 Mg Tabs (Promethazine Hcl) .... Take One Tablet Qid As Needed 9)  Cymbalta 60 Mg Cpep (Duloxetine Hcl) .... Take 2 Tablets Daily 10)  Crestor 20 Mg Tabs (Rosuvastatin Calcium) .... Take One Tablet At Bedtime 11)  Ambien 10 Mg Tabs (Zolpidem Tartrate) .... Take 1 Tab Every Other Night  Allergies (verified): 1)  ! Aspirin 2)  ! Naprosyn 3)  ! Niacin 4)  ! Doxycycline 5)  ! Bactrim 6)  ! Keflex 7)  ! Sulfa 8)  ! Lisinopril 9)  ! Steri-Strip (Adhesive Bandages) 10)  ! Benzoin Compound (Benzoin Compound)  Review of Systems      See HPI  Physical Exam  General:  obese, alert and well-developed. Head:  NCAT, + TTP over frontal and maxillary sinuses Eyes:  no injxn or inflammation Ears:  External ear exam shows no significant lesions or deformities.  Otoscopic  examination reveals clear canals, tympanic membranes are intact bilaterally without bulging, retraction, inflammation or discharge. Hearing is grossly normal bilaterally. Nose:  External nasal examination shows no deformity or inflammation. Nasal mucosa are pink and moist without lesions or exudates. Mouth:  Oral mucosa and oropharynx without lesions or exudates.  Teeth in good repair. Neck:  No deformities, masses, or tenderness noted. Lungs:  normal respiratory effort, no intercostal retractions, no accessory muscle use, and normal breath sounds.   Heart:  Normal rate and regular rhythm. S1 and S2 normal without gallop, murmur, click, rub or other extra sounds.   Impression & Recommendations:  Problem # 1:  SINUSITIS - ACUTE-NOS (ICD-461.9) pt's sxs and PE consistent w/ infxn.  start amox.  reviewed supportive care and red flags that should prompt return.  Pt expresses understanding and is in agreement w/ this plan. Her updated medication list for this problem includes:    Amoxicillin 875 Mg Tabs (Amoxicillin) .Marland Kitchen... 1 tab by mouth two times a day.  take w/ food.  Complete Medication List: 1)  Alprazolam 0.5 Mg Tabs (Alprazolam) .... Take 1 to 2 tablets by mouth once daily as needed 2)  Flexeril 10 Mg Tabs (Cyclobenzaprine hcl) .... Take one tablet every  8 hours. 3)  Fish Oil Oil (Fish oil) .... Take 1 capsule by mouth two times a day 4)  Furosemide 40 Mg Tabs (Furosemide) .Marland Kitchen.. 1 tab by mouth daily 5)  Bystolic 5 Mg Tabs (Nebivolol hcl) .Marland Kitchen.. 1 tab by mouth daily 6)  Vicodin 5-500 Mg Tabs (Hydrocodone-acetaminophen) .Marland Kitchen.. 1-2 tablet every 6 hours as needed 7)  Neurontin 600 Mg Tabs (Gabapentin) .... Three times a day 8)  Promethazine Hcl 25 Mg Tabs (Promethazine hcl) .... Take one tablet qid as needed 9)  Cymbalta 60 Mg Cpep (Duloxetine hcl) .... Take 2 tablets daily 10)  Crestor 20 Mg Tabs (Rosuvastatin calcium) .... Take one tablet at bedtime 11)  Ambien 10 Mg Tabs (Zolpidem tartrate) ....  Take 1 tab every other night 12)  Amoxicillin 875 Mg Tabs (Amoxicillin) .Marland Kitchen.. 1 tab by mouth two times a day.  take w/ food.  Patient Instructions: 1)  You have a sinus infection 2)  Take the Amoxicillin as directed 3)  Drink lots of fluids 4)  Mucinex to thin your congestion 5)  REST! 6)  Hang in there!!! Prescriptions: AMOXICILLIN 875 MG TABS (AMOXICILLIN) 1 tab by mouth two times a day.  take w/ food.  #20 x 0   Entered and Authorized by:   Neena Rhymes MD   Signed by:   Neena Rhymes MD on 06/24/2010   Method used:   Electronically to        [facility] [address] [phone]* (retail)       [address]       [address]       [place], [place]  [address]       Ph: [phone]       Fax: [phone]   RxID:   [id number]    Orders Added: 1)  Est. Patient Level III [[address]]

## 2010-07-01 NOTE — Progress Notes (Signed)
Summary: Prior Auth APPROVED BYSTOLIC ACS  Phone Note Refill Request Call back at [phone] Message from:  Pharmacy on June 22, 2010 11:39 AM  Refills Requested: Medication #1:  BYSTOLIC 5 MG TABS 1 tab by mouth daily   Dosage confirmed as above?Dosage Confirmed Prior Auth from [facility] [address] ID#: [id number]  Initial call taken by: Harold Barban,  June 22, 2010 11:40 AM  Follow-up for Phone Call        Prior Auth approved from 06-23-10 until 06-23-11. pharmacy faxed ..............Marland KitchenFelecia Deloach CMA  June 23, 2010 4:34 PM

## 2010-07-05 ENCOUNTER — Telehealth (INDEPENDENT_AMBULATORY_CARE_PROVIDER_SITE_OTHER): Payer: Self-pay | Admitting: *Deleted

## 2010-07-06 ENCOUNTER — Telehealth: Payer: Self-pay | Admitting: Family Medicine

## 2010-07-06 ENCOUNTER — Ambulatory Visit (INDEPENDENT_AMBULATORY_CARE_PROVIDER_SITE_OTHER): Payer: Medicaid Other | Admitting: Family Medicine

## 2010-07-06 ENCOUNTER — Encounter: Payer: Medicaid Other | Attending: Family Medicine | Admitting: *Deleted

## 2010-07-06 ENCOUNTER — Encounter: Payer: Self-pay | Admitting: Family Medicine

## 2010-07-06 DIAGNOSIS — Z713 Dietary counseling and surveillance: Secondary | ICD-10-CM | POA: Insufficient documentation

## 2010-07-06 DIAGNOSIS — J309 Allergic rhinitis, unspecified: Secondary | ICD-10-CM

## 2010-07-06 DIAGNOSIS — E669 Obesity, unspecified: Secondary | ICD-10-CM | POA: Insufficient documentation

## 2010-07-12 ENCOUNTER — Telehealth (INDEPENDENT_AMBULATORY_CARE_PROVIDER_SITE_OTHER): Payer: Self-pay | Admitting: *Deleted

## 2010-07-13 NOTE — Progress Notes (Signed)
Summary: refill  Phone Note Refill Request Message from:  Fax from Pharmacy on July 05, 2010 3:04 PM  Refills Requested: Medication #1:  FUROSEMIDE 40 MG TABS 1 tab by mouth daily  Medication #2:  FLEXERIL 10 MG  TABS Take one tablet every 8 hours. [facility] [address] - fax [phone]  Initial call taken by: Okey Regal Spring,  July 05, 2010 3:04 PM    Prescriptions: FUROSEMIDE 40 MG TABS (FUROSEMIDE) 1 tab by mouth daily  #30 Each x 3   Entered by:   Doristine Devoid CMA   Authorized by:   Neena Rhymes MD   Signed by:   Doristine Devoid CMA on 07/05/2010   Method used:   Electronically to        [facility] [address] [phone]* (retail)       [address]       [address]       [place], [place]  [address]       Ph: [phone]       Fax: [phone]   RxID:   [id number] FLEXERIL 10 MG  TABS (CYCLOBENZAPRINE HCL) Take one tablet every 8 hours.  #90 x 0   Entered by:   Doristine Devoid CMA   Authorized by:   Neena Rhymes MD   Signed by:   Doristine Devoid CMA on 07/05/2010   Method used:   Electronically to        [facility] [address] [phone]* (retail)       [address]       [address]       [place], [place]  [address]       Ph: [phone]       Fax: [phone]   RxID:   [id number]

## 2010-07-13 NOTE — Assessment & Plan Note (Signed)
Summary: 1 mo follow up///sph   Vital Signs:  Patient profile:   40 year old female Weight:      350 pounds BMI:     63.22 Pulse rate:   82 / minute BP sitting:   124 / 72  (left arm)  Vitals Entered By: Doristine Devoid CMA (July 06, 2010 11:15 AM) CC: 1 month roa    History of Present Illness: 40 yo woman here today to f/u on weight loss.  had nutrition appt earlier today.  gained 1 lb since last nutrition appt- had [place] last night, ate a lot of chips and salsa, notes some fluid retention.  ear pain- bilateral, woke pt from sleep.  complete course of high dose amox.  also has laryngitis.  no fevers.  not currently on allergy meds.  Current Medications (verified): 1)  Alprazolam 0.5 Mg Tabs (Alprazolam) .... Take 1 To 2 Tablets By Mouth Once Daily As Needed 2)  Flexeril 10 Mg  Tabs (Cyclobenzaprine Hcl) .... Take One Tablet Every 8 Hours. 3)  Fish Oil   Oil (Fish Oil) .... Take 1 Capsule By Mouth Two Times A Day 4)  Furosemide 40 Mg Tabs (Furosemide) .Marland Kitchen.. 1 Tab By Mouth Daily 5)  Bystolic 5 Mg Tabs (Nebivolol Hcl) .Marland Kitchen.. 1 Tab By Mouth Daily 6)  Vicodin 5-500 Mg Tabs (Hydrocodone-Acetaminophen) .Marland Kitchen.. 1-2 Tablet Every 6 Hours As Needed 7)  Neurontin 600 Mg Tabs (Gabapentin) .... Three Times A Day 8)  Promethazine Hcl 25 Mg Tabs (Promethazine Hcl) .... Take One Tablet Qid As Needed 9)  Cymbalta 60 Mg Cpep (Duloxetine Hcl) .... Take 2 Tablets Daily 10)  Crestor 20 Mg Tabs (Rosuvastatin Calcium) .... Take One Tablet At Bedtime 11)  Ambien 10 Mg Tabs (Zolpidem Tartrate) .... Take 1 Tab Every Other Night  Allergies (verified): 1)  ! Aspirin 2)  ! Naprosyn 3)  ! Niacin 4)  ! Doxycycline 5)  ! Bactrim 6)  ! Keflex 7)  ! Sulfa 8)  ! Lisinopril 9)  ! Steri-Strip (Adhesive Bandages) 10)  ! Benzoin Compound (Benzoin Compound)  Review of Systems      See HPI  Physical Exam  General:  obese, alert and well-developed. Head:  NCAT, no TTP over sinuses Eyes:  no injxn or  inflammation Ears:  External ear exam shows no significant lesions or deformities.  Otoscopic examination reveals clear canals, tympanic membranes are intact bilaterally without bulging, retraction, inflammation or discharge. Hearing is grossly normal bilaterally. Nose:  + turbinate edema Mouth:  Oral mucosa and oropharynx without lesions or exudates.  Teeth in good repair. Neck:  No deformities, masses, or tenderness noted. Lungs:  normal respiratory effort, no intercostal retractions, no accessory muscle use, and normal breath sounds.   Heart:  Normal rate and regular rhythm. S1 and S2 normal without gallop, murmur, click, rub or other extra sounds.   Impression & Recommendations:  Problem # 1:  OBESITY (ICD-278.00) Assessment Unchanged pt has gained weight from last visit.  reviewed importance of continued healthy food choices and regular exercise.  Problem # 2:  RHINITIS (ICD-477.9) Assessment: Deteriorated encouraged pt to start OTC Zyrtec for sxs relief.  Pt expresses understanding and is in agreement w/ this plan. Her updated medication list for this problem includes:    Promethazine Hcl 25 Mg Tabs (Promethazine hcl) .Marland Kitchen... Take one tablet qid as needed  Complete Medication List: 1)  Alprazolam 0.5 Mg Tabs (Alprazolam) .... Take 1 to 2 tablets by mouth once daily as needed  2)  Flexeril 10 Mg Tabs (Cyclobenzaprine hcl) .... Take one tablet every 8 hours. 3)  Fish Oil Oil (Fish oil) .... Take 1 capsule by mouth two times a day 4)  Furosemide 40 Mg Tabs (Furosemide) .Marland Kitchen.. 1 tab by mouth daily 5)  Bystolic 5 Mg Tabs (Nebivolol hcl) .Marland Kitchen.. 1 tab by mouth daily 6)  Vicodin 5-500 Mg Tabs (Hydrocodone-acetaminophen) .Marland Kitchen.. 1-2 tablet every 6 hours as needed 7)  Neurontin 600 Mg Tabs (Gabapentin) .... Three times a day 8)  Promethazine Hcl 25 Mg Tabs (Promethazine hcl) .... Take one tablet qid as needed 9)  Cymbalta 60 Mg Cpep (Duloxetine hcl) .... Take 2 tablets daily 10)  Crestor 20 Mg Tabs  (Rosuvastatin calcium) .... Take one tablet at bedtime 11)  Ambien 10 Mg Tabs (Zolpidem tartrate) .... Take 1 tab every other night  Patient Instructions: 1)  Follow up in 1 month to recheck weight 2)  Start Zyrtec daily for your allergies 3)  Drink plenty of water 4)  Try and get your regular activity as the weather warms 5)  Continue to make healthy food choices 6)  Call with any questions or concerns 7)  Hang in there!!!   Orders Added: 1)  Est. Patient Level III [[address]]

## 2010-07-13 NOTE — Progress Notes (Signed)
Summary: refill  Phone Note Refill Request Message from:  Fax from Pharmacy on July 06, 2010 1:23 PM  Refills Requested: Medication #1:  PROMETHAZINE HCL 25 MG TABS take one tablet qid as needed [facility] - fax [phone]  Initial call taken by: Okey Regal Spring,  July 06, 2010 1:23 PM  Follow-up for Phone Call        last filled 05-14-10 #30 , last OV 07-06-10.Marland KitchenMarland KitchenFelecia Deloach CMA  July 07, 2010 10:20 AM   Additional Follow-up for Phone Call Additional follow up Details #1::        ok for #30, no refills    Prescriptions: PROMETHAZINE HCL 25 MG TABS (PROMETHAZINE HCL) take one tablet qid as needed  #30 x 0   Entered by:   Jeremy Johann CMA   Authorized by:   Neena Rhymes MD   Signed by:   Jeremy Johann CMA on 07/07/2010   Method used:   Faxed to ...       [facility] [address] [phone]* (retail)       [address]       [address]       [place], [place]  [address]       Ph: [phone]       Fax: [phone]   RxID:   [id number]

## 2010-07-16 ENCOUNTER — Telehealth: Payer: Self-pay | Admitting: Family Medicine

## 2010-07-22 NOTE — Progress Notes (Signed)
Summary: refill  Phone Note Refill Request Message from:  Fax from Pharmacy on July 16, 2010 12:02 PM  Refills Requested: Medication #1:  VICODIN 5-500 MG TABS 1-2 tablet every 6 hours as needed   Last Refilled: 06/25/2010 [facility] [address] - [phone]  Initial call taken by: Okey Regal Spring,  July 16, 2010 12:03 PM  Follow-up for Phone Call        last filled 06/25/10 and filled 07/06/10.Marland KitchenMarland Kitchenplease advise Follow-up by: Almeta Monas CMA Duncan Dull),  July 16, 2010 12:54 PM  Additional Follow-up for Phone Call Additional follow up Details #1::        filled on 2/10, not 2/21 (promethazine) so ok for #60, no refills Additional Follow-up by: Neena Rhymes MD,  July 16, 2010 1:08 PM    Additional Follow-up for Phone Call Additional follow up Details #2::    it was a typo---sorry, she was seen 07/06/10 Follow-up by: Almeta Monas CMA Duncan Dull),  July 16, 2010 1:28 PM  Additional Follow-up for Phone Call Additional follow up Details #3:: Details for Additional Follow-up Action Taken: no problem- i'm still ok w/ giving her #60, no refills Additional Follow-up by: Neena Rhymes MD,  July 16, 2010 1:42 PM   Prescriptions: VICODIN 5-500 MG TABS (HYDROCODONE-ACETAMINOPHEN) 1-2 tablet every 6 hours as needed  #60 x 0   Entered by:   Almeta Monas CMA (AAMA)   Authorized by:   Neena Rhymes MD   Signed by:   Almeta Monas CMA (AAMA) on 07/16/2010   Method used:   Printed then faxed to ...       [facility] [address] [phone]* (retail)       [address]       [address]       [place], [place]  [address]       Ph: [phone]       Fax: [phone]   RxID:   [id number]

## 2010-07-22 NOTE — Progress Notes (Signed)
Summary: refill  Phone Note Refill Request Message from:  Fax from Pharmacy on July 12, 2010 10:27 AM  Refills Requested: Medication #1:  ALPRAZOLAM 0.5 MG TABS Take 1 to 2 tablets by mouth once daily as needed  Medication #2:  VICODIN 5-500 MG TABS 1-2 tablet every 6 hours as needed [facility] [address] - fax [phone]  Initial call taken by: Okey Regal Spring,  July 12, 2010 10:27 AM  Follow-up for Phone Call        Last ov 07-06-10, last filled  ALPRAZOLAM 06-04-10 #30 . last filled 06-25-10 #60...Marland KitchenMarland KitchenFelecia Deloach CMA  July 12, 2010 2:53 PM   Additional Follow-up for Phone Call Additional follow up Details #1::        ok for Alprazolam #30, no refills vicodin was just filled 17 days ago- this is a lot of pain med use.  no refills at this time. Additional Follow-up by: Neena Rhymes MD,  July 12, 2010 4:47 PM    Prescriptions: ALPRAZOLAM 0.5 MG TABS (ALPRAZOLAM) Take 1 to 2 tablets by mouth once daily as needed  #30 x 0   Entered by:   Doristine Devoid CMA   Authorized by:   Neena Rhymes MD   Signed by:   Doristine Devoid CMA on 07/12/2010   Method used:   Printed then faxed to ...       [facility] [address] [phone]* (retail)       [address]       [address]       [place], [place]  [address]       Ph: [phone]       Fax: [phone]   RxID:   [id number]

## 2010-07-29 ENCOUNTER — Telehealth: Payer: Self-pay | Admitting: Family Medicine

## 2010-08-03 NOTE — Progress Notes (Signed)
Summary: refill  Phone Note Refill Request Message from:  Fax from Pharmacy on July 29, 2010 2:33 PM  Refills Requested: Medication #1:  NEURONTIN 600 MG TABS three times a day [facility] - fax [phone]  Initial call taken by: Okey Regal Spring,  July 29, 2010 2:35 PM  Follow-up for Phone Call        This appears to have not been given by our office, please advise. Lucious Groves CMA  July 29, 2010 3:17 PM   Additional Follow-up for Phone Call Additional follow up Details #1::        ok for #90, 3 refills Additional Follow-up by: Neena Rhymes MD,  July 29, 2010 3:45 PM    Prescriptions: NEURONTIN 600 MG TABS (GABAPENTIN) three times a day  #90 x 3   Entered by:   Lucious Groves CMA   Authorized by:   Neena Rhymes MD   Signed by:   Lucious Groves CMA on 07/29/2010   Method used:   Electronically to        [facility] [address] [phone]* (retail)       [address]       [address]       [place], [place]  [address]       Ph: [phone]       Fax: [phone]   RxID:   [id number]

## 2010-08-04 ENCOUNTER — Encounter: Payer: Medicaid Other | Attending: Family Medicine | Admitting: *Deleted

## 2010-08-04 DIAGNOSIS — Z713 Dietary counseling and surveillance: Secondary | ICD-10-CM | POA: Insufficient documentation

## 2010-08-04 DIAGNOSIS — E669 Obesity, unspecified: Secondary | ICD-10-CM | POA: Insufficient documentation

## 2010-08-06 ENCOUNTER — Ambulatory Visit (INDEPENDENT_AMBULATORY_CARE_PROVIDER_SITE_OTHER): Payer: Medicaid Other | Admitting: Family Medicine

## 2010-08-06 VITALS — BP 120/88 | Temp 99.1°F | Ht 62.5 in | Wt 359.0 lb

## 2010-08-06 DIAGNOSIS — L989 Disorder of the skin and subcutaneous tissue, unspecified: Secondary | ICD-10-CM

## 2010-08-06 DIAGNOSIS — R221 Localized swelling, mass and lump, neck: Secondary | ICD-10-CM

## 2010-08-06 DIAGNOSIS — R220 Localized swelling, mass and lump, head: Secondary | ICD-10-CM

## 2010-08-06 DIAGNOSIS — F411 Generalized anxiety disorder: Secondary | ICD-10-CM

## 2010-08-06 DIAGNOSIS — E669 Obesity, unspecified: Secondary | ICD-10-CM

## 2010-08-06 MED ORDER — BUPROPION HCL ER (XL) 150 MG PO TB24: 150.0000 mg | ORAL_TABLET | Freq: Every day | ORAL | Status: DC

## 2010-08-06 NOTE — Progress Notes (Signed)
  Subjective:    Patient ID: [name], female    DOB: 04-Aug-1970, 40 y.o.   MRN: [id number]  HPI Obesity- has gained 9 lbs since last visit.  Admits to rare exercise due to R leg pain.  Pain is worsening depression caused by weight.  Skin tag- area on L chest wall will get frequently irritated, rubs on bra strap, has never bled but will be painful and raw.  Pt would like this removed.  Depression- weight and physical limitations are worsening depression.  Feels 'edgy', 'short tempered'.  Taking cymbalta daily.  Wonders if there is anything to add.  Tongue bump- started as an ulcer 4-5 weeks ago.  Pain resolved but area remains white and hard.  No drainage.  No pain.  Does not have dentist.  R lateral edge of tongue.   Review of Systems For ROS see HPI.  Past medical, surgical, family, and social hx reviewed for relevance and changes.    Objective:   Physical Exam  Constitutional: She appears well-developed and well-nourished. No distress.  HENT:  Head: Normocephalic and atraumatic.  Mouth/Throat: Oral lesions present. Normal dentition. No dental abscesses.         <1 cm white, firm, nontender area along R lateral border of tongue  Eyes: Conjunctivae and EOM are normal. Pupils are equal, round, and reactive to light.  Neck: Normal range of motion. Neck supple. No thyromegaly present.  Cardiovascular: Normal rate, regular rhythm and normal heart sounds.   No murmur heard. Pulmonary/Chest: Effort normal and breath sounds normal. No respiratory distress.  Musculoskeletal: She exhibits no edema.  Skin: Skin is warm and dry.          1 cm flesh colored skin tag along L lateral chest wall under L breast  Psychiatric: She has a normal mood and affect. Her behavior is normal.          Assessment & Plan:

## 2010-08-06 NOTE — Patient Instructions (Signed)
Follow up in 1 month to recheck weight Your skin tag removal will continue to ooze for the next day or so Keep area clean and dry Apply neosporin to the area If you have redness, swelling, pus or other concerns- call me! Hang in there!!!

## 2010-08-08 ENCOUNTER — Encounter: Payer: Self-pay | Admitting: Family Medicine

## 2010-08-08 NOTE — Assessment & Plan Note (Signed)
Pt continues to gain weight despite appts w/ nutritionist.  Admits to lack of exercise but continues to report fairly good dietary habits.  Will continue to follow and assist as able.

## 2010-08-08 NOTE — Assessment & Plan Note (Signed)
Pt w/ multiple skin tags but 1 on L rib cage is continually being irritated by bra strap and pt would like this removed.  Area was numbed w/ 1% lidocaine and skin tag was removed w/ scissors.  Pressure applied to area for 2-3 minutes and bandaid applied.  Pt tolerated procedure well and there were no apparent complications.

## 2010-08-08 NOTE — Assessment & Plan Note (Signed)
Uncertain as to what this is.  Encouraged pt to f/u w/ dentist for complete evaluation and tx if needed.  Discussed that while it doesn't appear consistent w/ oral cancer, this cannot be ruled out.

## 2010-08-08 NOTE — Assessment & Plan Note (Signed)
Pt's depression and anxiety continues to worsen despite taking daily meds.  Add wellbutrin for additional effect.  Will follow up in 1 month.

## 2010-08-10 ENCOUNTER — Other Ambulatory Visit: Payer: Self-pay | Admitting: Family Medicine

## 2010-08-10 NOTE — Telephone Encounter (Signed)
Pt was just in office. Please advise.

## 2010-08-11 MED ORDER — HYDROCODONE-ACETAMINOPHEN 5-500 MG PO TABS: 1.0000 | ORAL_TABLET | ORAL | Status: DC

## 2010-08-11 MED ORDER — ALPRAZOLAM 0.5 MG PO TABS: 0.5000 mg | ORAL_TABLET | Freq: Every day | ORAL | Status: DC | PRN

## 2010-08-11 NOTE — Telephone Encounter (Signed)
Done

## 2010-08-11 NOTE — Telephone Encounter (Signed)
Ok for Vicodin #45- no refills Ok for Alprazolam #60- no refills

## 2010-08-12 ENCOUNTER — Ambulatory Visit: Payer: Medicaid Other | Admitting: *Deleted

## 2010-08-16 ENCOUNTER — Other Ambulatory Visit: Payer: Self-pay | Admitting: *Deleted

## 2010-08-16 MED ORDER — CYCLOBENZAPRINE HCL 10 MG PO TABS: 10.0000 mg | ORAL_TABLET | Freq: Three times a day (TID) | ORAL | Status: DC | PRN

## 2010-08-16 NOTE — Telephone Encounter (Signed)
Done by Dr. [name]. 

## 2010-08-16 NOTE — Telephone Encounter (Signed)
Please advise 

## 2010-08-18 ENCOUNTER — Encounter: Payer: Medicaid Other | Attending: Family Medicine | Admitting: *Deleted

## 2010-08-18 DIAGNOSIS — E669 Obesity, unspecified: Secondary | ICD-10-CM | POA: Insufficient documentation

## 2010-08-18 DIAGNOSIS — Z713 Dietary counseling and surveillance: Secondary | ICD-10-CM | POA: Insufficient documentation

## 2010-08-19 ENCOUNTER — Telehealth: Payer: Self-pay | Admitting: *Deleted

## 2010-08-19 MED ORDER — DOXYCYCLINE HYCLATE 100 MG PO CAPS: 100.0000 mg | ORAL_CAPSULE | Freq: Two times a day (BID) | ORAL | Status: AC

## 2010-08-19 NOTE — Telephone Encounter (Signed)
Ok for Doxycycline 100mg  bid x10 days.  Take w/ food to avoid upset stomach.  If no better after abx will need appt.

## 2010-08-19 NOTE — Telephone Encounter (Signed)
Pt aware rx sent to pharmacy.

## 2010-08-23 ENCOUNTER — Encounter: Payer: Self-pay | Admitting: Family Medicine

## 2010-08-23 ENCOUNTER — Ambulatory Visit (INDEPENDENT_AMBULATORY_CARE_PROVIDER_SITE_OTHER): Payer: Medicaid Other | Admitting: Family Medicine

## 2010-08-23 VITALS — BP 134/88 | HR 78 | Wt 353.4 lb

## 2010-08-23 DIAGNOSIS — R7309 Other abnormal glucose: Secondary | ICD-10-CM

## 2010-08-23 DIAGNOSIS — R739 Hyperglycemia, unspecified: Secondary | ICD-10-CM | POA: Insufficient documentation

## 2010-08-23 MED ORDER — METFORMIN HCL 500 MG PO TABS: 500.0000 mg | ORAL_TABLET | Freq: Two times a day (BID) | ORAL | Status: DC

## 2010-08-23 NOTE — Progress Notes (Signed)
  Subjective:    Patient ID: [name], female    DOB: 1970-09-12, 40 y.o.   MRN: [id number]  HPI Elevated BS- started feeling 'bad' over the weekend and checked CBG on mom's machine, it was 271.  3 hrs later sugar was up to 390.  Fasting this AM was 150.  Has family hx of DM.  Increased urination, increased fatigue, increased thirst.     Review of Systems For ROS see HPI     Objective:   Physical Exam  Constitutional: She is oriented to person, place, and time. She appears well-developed and well-nourished. No distress.  Neck: Neck supple.  Cardiovascular: Normal rate, regular rhythm, normal heart sounds and intact distal pulses.   Pulmonary/Chest: Effort normal and breath sounds normal. No respiratory distress. She has no wheezes.  Abdominal: Soft. Bowel sounds are normal. She exhibits no distension. There is no tenderness.  Neurological: She is alert and oriented to person, place, and time.  Skin: Skin is warm and dry.          Assessment & Plan:

## 2010-08-23 NOTE — Assessment & Plan Note (Signed)
Pt's dramatically elevated blood sugars likely mean that she is now diabetic.  Check labs.  Start metformin.  Pt has meter and strips at home (mom had 2).  Will check sugars and bring these readings to next appt.  Reviewed importance of ADA diet and regular exercise.  Will follow closely.

## 2010-08-23 NOTE — Patient Instructions (Signed)
Follow up in 3-4 weeks to recheck sugars Check 1st thing in the morning and once 2 hrs after eating- write these down and bring them next time Try and limit carb intake Start the Metformin as directed- don't be surprised if there is some diarrhea for the 1st 1-2 weeks Drink plenty of water Call with any questions or concerns Hang in there!

## 2010-08-24 LAB — HEMOGLOBIN A1C: Hgb A1c MFr Bld: 5.9 % (ref 4.6–6.5)

## 2010-08-24 LAB — BASIC METABOLIC PANEL
CO2: 27 mEq/L (ref 19–32)
Chloride: 103 mEq/L (ref 96–112)
Creatinine, Ser: 0.8 mg/dL (ref 0.4–1.2)
Potassium: 4.6 mEq/L (ref 3.5–5.1)

## 2010-08-25 ENCOUNTER — Telehealth: Payer: Self-pay | Admitting: Family Medicine

## 2010-08-25 NOTE — Telephone Encounter (Signed)
Unfortunately i won't be able to call pt until next week due to my schedule today and the fact that i'm leaving town.  If she continues the metformin it won't hurt her- women take this for PCOS and other issues all the time.  If she doesn't want to take it she doesn't have to since she isn't diabetic.  If she still wants me to call her next week i'll be happy to but please pass along this info.  Thanks!

## 2010-08-25 NOTE — Telephone Encounter (Signed)
Pt lmovm requesting a call from MD to discuss Metformin.

## 2010-08-25 NOTE — Telephone Encounter (Signed)
Pt notified and notes that she feels much better on the med and she will continue the Metformin.

## 2010-09-01 ENCOUNTER — Other Ambulatory Visit: Payer: Self-pay | Admitting: *Deleted

## 2010-09-01 NOTE — Telephone Encounter (Signed)
Refill request was for pain med (vicodin) that was just filled at the end of March. Noted that refill is not appropriate and faxed to pharmacy.

## 2010-09-03 ENCOUNTER — Other Ambulatory Visit: Payer: Self-pay | Admitting: *Deleted

## 2010-09-03 MED ORDER — HYDROCODONE-ACETAMINOPHEN 5-500 MG PO TABS: 1.0000 | ORAL_TABLET | ORAL | Status: DC

## 2010-09-03 NOTE — Telephone Encounter (Signed)
Hard copy faxed. 

## 2010-09-03 NOTE — Telephone Encounter (Signed)
Please advise 

## 2010-09-06 ENCOUNTER — Ambulatory Visit: Payer: Medicaid Other | Admitting: Family Medicine

## 2010-09-07 ENCOUNTER — Ambulatory Visit: Payer: Medicaid Other | Admitting: Family Medicine

## 2010-09-08 ENCOUNTER — Ambulatory Visit (INDEPENDENT_AMBULATORY_CARE_PROVIDER_SITE_OTHER): Payer: Medicaid Other | Admitting: Family Medicine

## 2010-09-08 DIAGNOSIS — R7309 Other abnormal glucose: Secondary | ICD-10-CM

## 2010-09-08 DIAGNOSIS — R739 Hyperglycemia, unspecified: Secondary | ICD-10-CM

## 2010-09-08 DIAGNOSIS — E669 Obesity, unspecified: Secondary | ICD-10-CM

## 2010-09-08 NOTE — Assessment & Plan Note (Signed)
This is first time pt has lost weight since starting to monitor this.  Applauded this.  Encouraged pt to increase exercise to at least 3x/week in water aerobics and attempt to walk on the other days.  Will continue to follow.

## 2010-09-08 NOTE — Patient Instructions (Signed)
Follow up in 1 month to recheck weight Goal is to go to water aerobics 3x/week and walk on the other days Continue to make healthy food choices Call with any questions or concerns Hang in there!!

## 2010-09-08 NOTE — Assessment & Plan Note (Signed)
Pt reports CBGs are 'much better' on metformin.  Feels that overall she feels 'better'.  Will continue at this time and monitor.

## 2010-09-08 NOTE — Progress Notes (Signed)
  Subjective:    Patient ID: [name], female    DOB: 30-Aug-1970, 40 y.o.   MRN: [id number]  HPI Obesity- has lost 2 lbs since 4/9 visit.  Is in water aerobics, went twice last week.  Limited exercise outside of class.  Has appt upcoming w/ nutritionist.  This is first time pt has lost weight rather than gained.  Elevated BS- since starting metformin pt reports sugars have been in the 90s-110s.  'i feel a lot better'.  No longer having 'really high sugars' or 'feeling crappy'.   Review of Systems For ROS see HPI     Objective:   Physical Exam  Constitutional: She appears well-developed and well-nourished. No distress.       Morbidly obese  Skin: Skin is warm and dry.  Psychiatric: She has a normal mood and affect. Her behavior is normal. Judgment and thought content normal.          Assessment & Plan:

## 2010-09-10 ENCOUNTER — Telehealth: Payer: Self-pay | Admitting: *Deleted

## 2010-09-10 MED ORDER — PROMETHAZINE HCL 25 MG PO TABS: 25.0000 mg | ORAL_TABLET | Freq: Four times a day (QID) | ORAL | Status: DC | PRN

## 2010-09-10 MED ORDER — ALPRAZOLAM 0.5 MG PO TABS: 0.5000 mg | ORAL_TABLET | Freq: Every day | ORAL | Status: DC | PRN

## 2010-09-10 NOTE — Telephone Encounter (Signed)
Pt was just seen in office. Phenergan was last refilled 2/22 #30 and Xanax 3/28 #60. Please advise of refills allowed.

## 2010-09-13 NOTE — Telephone Encounter (Signed)
Ok for phenergan #30, alprazolam #60.  No refills

## 2010-09-13 NOTE — Telephone Encounter (Signed)
These were done on 09/10/10.

## 2010-09-22 ENCOUNTER — Ambulatory Visit: Payer: Medicaid Other | Admitting: *Deleted

## 2010-09-22 ENCOUNTER — Ambulatory Visit (INDEPENDENT_AMBULATORY_CARE_PROVIDER_SITE_OTHER): Payer: Medicaid Other | Admitting: Family Medicine

## 2010-09-22 ENCOUNTER — Encounter: Payer: Self-pay | Admitting: *Deleted

## 2010-09-22 DIAGNOSIS — R5381 Other malaise: Secondary | ICD-10-CM

## 2010-09-22 DIAGNOSIS — J029 Acute pharyngitis, unspecified: Secondary | ICD-10-CM

## 2010-09-22 DIAGNOSIS — L03319 Cellulitis of trunk, unspecified: Secondary | ICD-10-CM

## 2010-09-22 DIAGNOSIS — L02219 Cutaneous abscess of trunk, unspecified: Secondary | ICD-10-CM

## 2010-09-22 LAB — CBC WITH DIFFERENTIAL/PLATELET
Basophils Relative: 2.6 % (ref 0.0–3.0)
Eosinophils Absolute: 0.3 10*3/uL (ref 0.0–0.7)
HCT: 42.2 % (ref 36.0–46.0)
Hemoglobin: 14.3 g/dL (ref 12.0–15.0)
Lymphocytes Relative: 31.7 % (ref 12.0–46.0)
Lymphs Abs: 2.4 10*3/uL (ref 0.7–4.0)
MCHC: 33.8 g/dL (ref 30.0–36.0)
MCV: 89.1 fl (ref 78.0–100.0)
Monocytes Absolute: 0.5 10*3/uL (ref 0.1–1.0)
Neutro Abs: 4.3 10*3/uL (ref 1.4–7.7)
RBC: 4.74 Mil/uL (ref 3.87–5.11)

## 2010-09-22 MED ORDER — CLINDAMYCIN HCL 300 MG PO CAPS: 300.0000 mg | ORAL_CAPSULE | Freq: Three times a day (TID) | ORAL | Status: AC

## 2010-09-22 NOTE — Patient Instructions (Signed)
Follow up in 1 month to recheck weight Take the Clindamycin as directed We'll notify you of your lab results Try and be more active- i know it's hard but it will pay dividends in the future! Hang in there!!!

## 2010-09-22 NOTE — Assessment & Plan Note (Signed)
Check CBC and thyroid to r/o underlying cause.  Discussed how pt's depression could be factoring into this.  Also discussed how her obesity may be contributing.  Stressed the importance of regular activity regardless of energy level w/ the idea being to 'pay it forward' so that she might actually find herself w/ more energy afterwards.  Will follow.

## 2010-09-22 NOTE — Assessment & Plan Note (Signed)
This is recurrent problem for pt.  Start Onaka as this has worked well in the past.

## 2010-09-22 NOTE — Progress Notes (Signed)
  Subjective:    Patient ID: [name], female    DOB: 08/13/1970, 40 y.o.   MRN: [id number]  HPI Sore throat- pt reports she had sore throat yesterday but this has resolved this AM.  No fevers/chills, nasal congestion, ear pain.  Cellulitis of abdomen- started as a small bump/bite 1 week ago, has developed dark center w/ surrounding redness.  + pain.  No fevers.  Hx of similar  Fatigue- sxs started a few months ago.  Gets tired doing normal day to day activities.  Wants to be more active but doesn't have the energy.  Then gets depressed about inability to do what she wants.   Review of Systems For ROS see HPI     Objective:   Physical Exam  Constitutional: She is oriented to person, place, and time. She appears well-developed and well-nourished. No distress.  HENT:  Head: Normocephalic and atraumatic.  Right Ear: External ear normal.  Left Ear: External ear normal.  Nose: Nose normal.  Mouth/Throat: Oropharynx is clear and moist.  Eyes: Conjunctivae and EOM are normal. Pupils are equal, round, and reactive to light.  Neck: Normal range of motion. Neck supple. No thyromegaly present.  Cardiovascular: Normal rate, regular rhythm, normal heart sounds and intact distal pulses.   Pulmonary/Chest: Effort normal and breath sounds normal. No respiratory distress.  Abdominal: Soft.       2 in area of erythema w/ central pore on R anterior abdomen  Lymphadenopathy:    She has no cervical adenopathy.  Neurological: She is alert and oriented to person, place, and time.  Skin: Skin is warm and dry.          Assessment & Plan:

## 2010-09-22 NOTE — Assessment & Plan Note (Signed)
Resolved prior to arrival.  No evidence of infxn on PE.  Reviewed supportive care and red flags that should prompt return.  Pt expressed understanding and is in agreement w/ plan.

## 2010-09-23 ENCOUNTER — Telehealth: Payer: Self-pay | Admitting: *Deleted

## 2010-09-23 MED ORDER — HYDROCODONE-ACETAMINOPHEN 5-500 MG PO TABS: 1.0000 | ORAL_TABLET | ORAL | Status: DC

## 2010-09-23 NOTE — Telephone Encounter (Signed)
Faxed hard copy

## 2010-09-23 NOTE — Telephone Encounter (Signed)
Ok for #60 

## 2010-09-23 NOTE — Telephone Encounter (Signed)
Pt left message on voicemail requesting refill of pain med. Pt notes that she was previously getting #60 and it had been reduced to #45. Pt notes that she was not aware of the change or reason for that change. Pt is requesting #60. Please advise.

## 2010-09-27 ENCOUNTER — Ambulatory Visit: Payer: Medicaid Other | Admitting: *Deleted

## 2010-10-01 NOTE — Assessment & Plan Note (Signed)
[facility]                        [place] OFFICE NOTE   NAME:BARNESJalani, Cullifer                          MRN:          [id number]  DATE:07/28/2006                            DOB:          1971-01-14    REASON FOR VISIT:  Blood pressure check.   HISTORY OF PRESENT ILLNESS:  [name] is a pleasant female who is presenting  here for her blood pressure check. She also has a history of anxiety,  for which she uses Xanax p.r.n. The patient also has a history of  fibromyalgia, for which she uses Darvocet N-100 p.r.n. The last time  that she used Darvocet was back in December. The patient is otherwise  doing well except for recently having a cellulitis of the right leg. She  reports she was seen at an Urgent Care on June 22, 2006, where she  was given IV antibiotics and more antibiotics for 10 days. Symptoms have  improved significantly. She denies any redness or drainage. She has  noticed some discomfort in the area but not as severe as it was before.  The patient does have a history of superficial thrombophlebitis in the  past.   The patient also is going through the process of being evaluated for  bariatric surgery. She needs a letter of medical necessity to be filled  out.   MEDICATIONS:  1. Hydrochlorothiazide 25 mg daily.  2. Xanax 0.5 mg 1 p.o. daily p.r.n.  3. Darvocet N-100 1 p.o. q.6 hours p.r.n., usually gets 20 tablets.   ALLERGIES:  NAPROXEN, ASPIRIN.   OBJECTIVE:  VITAL SIGNS:  As per vital sign sheet.  GENERAL:  Pleasant female in no acute distress. Answers questions  appropriately.  NECK:  Supple. No lymphadenopathy, carotid bruits or JVD.  LUNGS:  Clear.  HEART:  Regular rate and rhythm. Normal S1 and S2. No murmur, rub, or  gallop.  EXTREMITIES:  No clubbing, cyanosis, or edema. Medial aspect of the  lower leg is significant for a 2 x 3 cm area that appears to be healing  with slight scaling. No significant redness. Not warm  to the touch. Mild  tenderness to palpation.   IMPRESSION:  1. Hypertension.  2. Anxiety.  3. Fibromyalgia.  4. Cellulitis of the right leg, healed.   PLAN:  1. With regards to her hypertension, will continue hydrochlorothiazide      25 mg daily. Will check a B-met. Additionally, will do a fasting      lipid profile.  2. With regards to her anxiety, she continues to use Xanax as needed.  3. With regards to her fibromyalgia, no new treatment. Did refill her      Darvocet N-100 #20 tablets with no refills.  4. In regards to her right lower extremity, I advised her to continue      to monitor the area for complete resolution. If she continues to      have discomfort after a week or two or if it works, she is to      followup.  5. Will fill out the letter of medical necessity.Marland Kitchen  Leanne Chang, M.D.  Electronically Signed    LA/MedQ  DD: 07/28/2006  DT: 07/29/2006  Job #: [id number]

## 2010-10-06 ENCOUNTER — Other Ambulatory Visit: Payer: Self-pay | Admitting: *Deleted

## 2010-10-06 MED ORDER — CYCLOBENZAPRINE HCL 10 MG PO TABS: 10.0000 mg | ORAL_TABLET | Freq: Three times a day (TID) | ORAL | Status: DC | PRN

## 2010-10-06 MED ORDER — ALPRAZOLAM 0.5 MG PO TABS: 0.5000 mg | ORAL_TABLET | Freq: Every day | ORAL | Status: DC | PRN

## 2010-10-06 NOTE — Telephone Encounter (Signed)
Ok for 60 each but there are both PRN meds

## 2010-10-06 NOTE — Telephone Encounter (Signed)
Ok for #90, no refills 

## 2010-10-06 NOTE — Telephone Encounter (Signed)
Pt wants 90 day supply.Please advise

## 2010-10-06 NOTE — Telephone Encounter (Signed)
Discuss with patient, Rx sent to pharmacy. 

## 2010-10-06 NOTE — Telephone Encounter (Signed)
Last Ov 09-22-10, Xanax Last filled 09-10-10 #60,flexeril last filled 08-16-10 #60

## 2010-10-19 ENCOUNTER — Other Ambulatory Visit: Payer: Self-pay | Admitting: Family Medicine

## 2010-10-19 NOTE — Telephone Encounter (Signed)
Pt is utd on appts. Next appt is scheduled for 10/25/10.

## 2010-10-20 MED ORDER — HYDROCODONE-ACETAMINOPHEN 5-500 MG PO TABS: 1.0000 | ORAL_TABLET | ORAL | Status: DC

## 2010-10-20 NOTE — Telephone Encounter (Signed)
Refill sent.

## 2010-10-20 NOTE — Telephone Encounter (Signed)
Ok for #60, 1 refill 

## 2010-10-25 ENCOUNTER — Ambulatory Visit (INDEPENDENT_AMBULATORY_CARE_PROVIDER_SITE_OTHER): Payer: Medicaid Other | Admitting: Family Medicine

## 2010-10-25 DIAGNOSIS — E669 Obesity, unspecified: Secondary | ICD-10-CM

## 2010-10-25 DIAGNOSIS — M79609 Pain in unspecified limb: Secondary | ICD-10-CM

## 2010-10-25 NOTE — Patient Instructions (Signed)
Follow up in 1 month Hang in there- hopefully you will have the surgery next month! Try and make healthy food choices Exercise as able [name] Must w/ surgery!!!

## 2010-10-25 NOTE — Progress Notes (Signed)
  Subjective:    Patient ID: [name], female    DOB: 14-Jun-1970, 40 y.o.   MRN: [id number]  HPI Obesity- unable to exercise due to chronic leg pain.  Frustrated by current state.  Has gained 11 lbs since last visit.  Pt's weight keeps climbing.  In 2 yrs has gained ~65 lbs.  Neuropathic pain- nerve pain in feet and legs is keeping her awake at night.  Acupuncture was recommended but Medicaid doesn't cover this.  Taking Gabapentin tid.  Increased pain w/ mobility.  Pain management and ortho disagree about cause of pain.    Review of Systems For ROS see HPI     Objective:   Physical Exam  Constitutional: She appears well-developed and well-nourished. No distress.       Morbidly obese  Psychiatric: She has a normal mood and affect. Her behavior is normal. Thought content normal.          Assessment & Plan:

## 2010-11-01 ENCOUNTER — Telehealth: Payer: Self-pay | Admitting: *Deleted

## 2010-11-01 DIAGNOSIS — R0683 Snoring: Secondary | ICD-10-CM

## 2010-11-01 NOTE — Telephone Encounter (Signed)
Pt states that family members tell her she stops breathing at night and they cannot sleep due to her snoring. Pt notes that she previously had a sleep study, but was wondering if it could be done again. Pt also was wondering if she could be placed on a stimulant to assist with her weight or to keep her from gaining more weight until her surgery. Please advise.

## 2010-11-01 NOTE — Telephone Encounter (Signed)
Pt.notified

## 2010-11-01 NOTE — Telephone Encounter (Signed)
No stimulants due to depression and anxiety  Ok to repeat sleep study based on dx of snoring

## 2010-11-02 ENCOUNTER — Other Ambulatory Visit: Payer: Self-pay | Admitting: *Deleted

## 2010-11-02 MED ORDER — FUROSEMIDE 40 MG PO TABS: 40.0000 mg | ORAL_TABLET | Freq: Every day | ORAL | Status: DC

## 2010-11-02 NOTE — Telephone Encounter (Signed)
Refill sent.

## 2010-11-02 NOTE — Assessment & Plan Note (Signed)
Pt reports she is frustrated not only w/ her amount of pain but the differing opinions on the cause.  Is seeing both ortho and pain management.  Will continue to follow.

## 2010-11-02 NOTE — Assessment & Plan Note (Signed)
i fear that w/out aggressive action that pt will continue to gain weight- putting her at increased risk for morbidity and mortality.  She is already seeing nutrition and is unable to exercise regularly due to chronic pain.  At this point, surgery is likely her only option.  Pt aware of this and is proceeding through the process at [facility].  Will continue to follow.

## 2010-11-12 ENCOUNTER — Telehealth: Payer: Self-pay | Admitting: *Deleted

## 2010-11-12 NOTE — Telephone Encounter (Signed)
I spoke with pt this AM and confirmed that our office did receive the loan paperwork. Pt notes that she saw her disability attorney this AM and needs her last ov and note from the MD to act as a summary of her neuropathy condition faxed to the disability attorney. Pt is aware that MD will try to do this early next week and I will contact her once completed. I advised the pt that this cannot be done today and she expressed understanding. Pt notes that the deadline is prior to July 9th because that is her court date.

## 2010-11-16 ENCOUNTER — Encounter: Payer: Self-pay | Admitting: Pulmonary Disease

## 2010-11-16 ENCOUNTER — Ambulatory Visit (INDEPENDENT_AMBULATORY_CARE_PROVIDER_SITE_OTHER): Payer: Medicaid Other | Admitting: Pulmonary Disease

## 2010-11-16 DIAGNOSIS — R0683 Snoring: Secondary | ICD-10-CM

## 2010-11-16 DIAGNOSIS — G4733 Obstructive sleep apnea (adult) (pediatric): Secondary | ICD-10-CM

## 2010-11-16 DIAGNOSIS — R0989 Other specified symptoms and signs involving the circulatory and respiratory systems: Secondary | ICD-10-CM

## 2010-11-16 DIAGNOSIS — R0609 Other forms of dyspnea: Secondary | ICD-10-CM

## 2010-11-16 NOTE — Assessment & Plan Note (Addendum)
The pt's history is very consistent with significant [name].  Her sleep study in 2008 was negative, but she has gained 88 pounds since that time, and her history is classic.  I have had a long discussion with the pt about sleep apnea, including its impact on QOL and CV health.  I think she needs to have a sleep study done to establish a diagnosis, and she is agreeable.

## 2010-11-16 NOTE — Telephone Encounter (Signed)
Pt.notified

## 2010-11-16 NOTE — Patient Instructions (Signed)
Will set up for home sleep testing, and will call once results are available. Work on weight loss

## 2010-11-16 NOTE — Progress Notes (Signed)
  Subjective:    Patient ID: [name], female    DOB: Jul 10, 1970, 40 y.o.   MRN: [id number]  HPI The pt is a 39y/o female who I have been asked to see for possible osa.  Her history is significant for: -loud snoring with abnl breathing pattern noted during sleep -frequent awakenings with nonrestorative sleep -EDS with inactivity, and will fall asleep watching tv in the evening.  No issues with driving. -weight has increased 50 pounds the last 2 yrs, and epworth 13 today.  Sleep Questionnaire: What time do you typically go to bed?( Between what hours) 10 and 11 pm How long does it take you to fall asleep? 3 mins How many times during the night do you wake up? 5 What time do you get out of bed to start your day? 1000 Do you drive or operate heavy machinery in your occupation? No How much has your weight changed (up or down) over the past two years? (In pounds) 50 lb (22.68 kg) Have you ever had a sleep study before? Yes If yes, location of study? [facility] If yes, date of study? ?2006 Do you currently use CPAP? No Do you wear oxygen at any time? No    Review of Systems  Constitutional: Positive for unexpected weight change. Negative for fever.  HENT: Positive for congestion. Negative for ear pain, nosebleeds, sore throat, rhinorrhea, sneezing, trouble swallowing, dental problem, postnasal drip and sinus pressure.   Eyes: Negative for redness and itching.  Respiratory: Positive for shortness of breath. Negative for cough, chest tightness and wheezing.   Cardiovascular: Positive for leg swelling. Negative for palpitations.  Gastrointestinal: Negative for nausea and vomiting.  Genitourinary: Negative for dysuria.  Musculoskeletal: Positive for joint swelling.  Skin: Negative for rash.  Neurological: Positive for headaches.  Hematological: Does not bruise/bleed easily.  Psychiatric/Behavioral: Positive for dysphoric mood. The patient is nervous/anxious.          Objective:   Physical Exam Constitutional: morbidly obese female, no acute distress  HENT:  Nares patent without discharge, enlarged turbinates  Oropharynx without exudate, palate and uvula are significantly elongated, narrow posterior pharynx  Eyes:  Perrla, eomi, no scleral icterus  Neck:  No JVD, no TMG  Cardiovascular:  Normal rate, regular rhythm, no rubs or gallops.  No murmurs        Intact distal pulses  Pulmonary :  Normal breath sounds, no stridor or respiratory distress   No rales, rhonchi, or wheezing  Abdominal:  Soft, nondistended, bowel sounds present.  No tenderness noted.   Musculoskeletal: +1 lower extremity edema noted.  Lymph Nodes:  No cervical lymphadenopathy noted  Skin:  No cyanosis noted  Neurologic:  Alert, appropriate, moves all 4 extremities without obvious deficit.         Assessment & Plan:

## 2010-11-16 NOTE — Telephone Encounter (Signed)
Please let pt know that we will not be completing loan paper work b/c it requires [place] to say that she is permanently disabled and unable to work in any capacity for the next 5 yrs.  Unfortunately since I am not a disability doctor, I am not able to make this determination.  I am also not able to complete the disability packet sent by her disability attorney- that packet will need to be completed by the doctor doing the disability exam.  i provided a summary letter for her disability claim previously when the rheumatologist refused.  Unfortunately we just had an episode where one of our primary care doctors was informed that we are not qualified to make decisions on disability b/c we are not disability or occupational specialists.  At this time i will continue to fill out FMLA forms but we will not do disability claims.

## 2010-11-19 ENCOUNTER — Other Ambulatory Visit: Payer: Self-pay | Admitting: Family Medicine

## 2010-11-19 MED ORDER — PROMETHAZINE HCL 25 MG PO TABS: 25.0000 mg | ORAL_TABLET | Freq: Four times a day (QID) | ORAL | Status: DC | PRN

## 2010-11-19 NOTE — Telephone Encounter (Signed)
Ok for #30, no refills 

## 2010-11-19 NOTE — Telephone Encounter (Signed)
Please advise 

## 2010-11-19 NOTE — Telephone Encounter (Signed)
Done

## 2010-11-24 ENCOUNTER — Ambulatory Visit: Payer: Medicaid Other | Admitting: Family Medicine

## 2010-11-25 ENCOUNTER — Ambulatory Visit (INDEPENDENT_AMBULATORY_CARE_PROVIDER_SITE_OTHER): Payer: Medicaid Other | Admitting: Family Medicine

## 2010-11-25 VITALS — BP 120/80 | Temp 97.9°F | Wt 368.2 lb

## 2010-11-25 DIAGNOSIS — K122 Cellulitis and abscess of mouth: Secondary | ICD-10-CM

## 2010-11-25 LAB — POCT RAPID STREP A (OFFICE): Rapid Strep A Screen: NEGATIVE

## 2010-11-25 MED ORDER — AMOXICILLIN 400 MG/5ML PO SUSR: ORAL | Status: DC

## 2010-11-25 MED ORDER — LIDOCAINE VISCOUS 2 % MT SOLN: 20.0000 mL | OROMUCOSAL | Status: AC | PRN

## 2010-11-25 NOTE — Progress Notes (Signed)
  Subjective:    Patient ID: [name], female    DOB: 11/10/1970, 40 y.o.   MRN: [id number]  HPI Sore throat- started yesterday.  Last night felt that uvula was 'choking her'.  Vomited while brushing teeth.  Difficulty swallowing pills.  Unable to eat.  No fevers.  No cough, runny nose, ear pain, sick contacts.   Review of Systems For ROS see HPI     Objective:   Physical Exam  Constitutional:       Obviously uncomfortable  HENT:  Head: Normocephalic and atraumatic.  Nose: Nose normal.       TMs normal bilaterally No sinus tenderness Tonsils normal in size and appearance Uvula erythematous, swollen, w/ ulceration  Eyes: Conjunctivae and EOM are normal. Pupils are equal, round, and reactive to light.  Neck: Normal range of motion.  Cardiovascular: Normal rate, regular rhythm and normal heart sounds.   Pulmonary/Chest: Effort normal and breath sounds normal. No respiratory distress. She has no wheezes. She has no rales.  Lymphadenopathy:    She has no cervical adenopathy.          Assessment & Plan:

## 2010-11-25 NOTE — Patient Instructions (Signed)
Start the Amoxicillin as directed Use the lidocaine- swish and spit- 3x/day as needed If you have trouble breathing- please go to the ER If symptoms are worsening- call me and we can prescribe steroids for the swelling Call with any questions or concerns Hang in there!

## 2010-11-25 NOTE — Assessment & Plan Note (Signed)
Pt w/ swollen, erythematous, ulcerated uvula.  According to UpToDate article this is most likely infections.  Will start Amox.  Viscous lido for pain relief.  Reviewed importance of calling for steroids if sxs worsen or going to the ER if breathing becomes problematic- pt expressed understanding.

## 2010-11-26 ENCOUNTER — Telehealth: Payer: Self-pay | Admitting: *Deleted

## 2010-11-26 MED ORDER — PREDNISONE 20 MG PO TABS: 20.0000 mg | ORAL_TABLET | Freq: Two times a day (BID) | ORAL | Status: AC

## 2010-11-26 NOTE — Telephone Encounter (Signed)
Ok for Prednisone 20mg , 2 tabs daily x7 days, #14, no refills

## 2010-11-26 NOTE — Telephone Encounter (Signed)
Left message on vm notifying pt. 

## 2010-11-26 NOTE — Telephone Encounter (Signed)
Pt lm on vm requesting steroid rx noting that she does not feel better. In fact, she is worse. Please advise.

## 2010-11-29 ENCOUNTER — Other Ambulatory Visit: Payer: Self-pay | Admitting: Family Medicine

## 2010-11-29 NOTE — Telephone Encounter (Signed)
Last refilled on 10/06/10. Please advise.

## 2010-11-29 NOTE — Telephone Encounter (Signed)
Refill x1 

## 2010-12-06 ENCOUNTER — Other Ambulatory Visit: Payer: Self-pay | Admitting: *Deleted

## 2010-12-06 ENCOUNTER — Ambulatory Visit: Payer: Self-pay | Admitting: Family Medicine

## 2010-12-06 MED ORDER — CYCLOBENZAPRINE HCL 10 MG PO TABS: 10.0000 mg | ORAL_TABLET | Freq: Three times a day (TID) | ORAL | Status: DC | PRN

## 2010-12-06 NOTE — Telephone Encounter (Signed)
Last refilled 10/06/10 #90. Please advise.

## 2010-12-06 NOTE — Telephone Encounter (Signed)
Sent by MD. 

## 2010-12-07 ENCOUNTER — Encounter: Payer: Self-pay | Admitting: *Deleted

## 2010-12-07 ENCOUNTER — Telehealth: Payer: Self-pay | Admitting: Pulmonary Disease

## 2010-12-07 NOTE — Telephone Encounter (Signed)
I see where KC wanted this done at her last ov, will forward to [facility] to check on this. Pls advise, thanks!

## 2010-12-08 ENCOUNTER — Other Ambulatory Visit: Payer: Self-pay | Admitting: Family Medicine

## 2010-12-08 MED ORDER — DULOXETINE HCL 60 MG PO CPEP: 60.0000 mg | ORAL_CAPSULE | Freq: Every day | ORAL | Status: DC

## 2010-12-08 NOTE — Telephone Encounter (Signed)
Refill sent.

## 2010-12-08 NOTE — Telephone Encounter (Signed)
LMOAM for pt to return my call about home sleep study.

## 2010-12-09 NOTE — Telephone Encounter (Signed)
Called and spoke with patient and she is coming today to pick up apnea link device.

## 2010-12-10 ENCOUNTER — Other Ambulatory Visit: Payer: Self-pay | Admitting: *Deleted

## 2010-12-10 MED ORDER — VALACYCLOVIR HCL 1 G PO TABS: 2000.0000 mg | ORAL_TABLET | Freq: Two times a day (BID) | ORAL | Status: DC

## 2010-12-10 NOTE — Telephone Encounter (Signed)
Rx sent. Lmovm notifying pt.

## 2010-12-10 NOTE — Telephone Encounter (Signed)
Pt left message noting that she has previously taken Valtrex for oral ulcers (in 2008). 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 Pt is requesting the rx now due to having 4 oral ulcers. She notes that she has an upcoming f/u on 12/15/10. Please advise.

## 2010-12-10 NOTE — Telephone Encounter (Signed)
Ok for Valtrex 1000mg  tabs.  2 tabs q12 x2 doses.  #4 tabs, 3 refills

## 2010-12-13 ENCOUNTER — Other Ambulatory Visit: Payer: Self-pay | Admitting: Family Medicine

## 2010-12-13 MED ORDER — HYDROCODONE-ACETAMINOPHEN 5-500 MG PO TABS: 1.0000 | ORAL_TABLET | ORAL | Status: DC

## 2010-12-13 NOTE — Telephone Encounter (Signed)
Last fill 10/20/10. Last ov 11/25/10

## 2010-12-13 NOTE — Telephone Encounter (Signed)
Ok to refill #60, w/ 1 refill

## 2010-12-13 NOTE — Telephone Encounter (Signed)
done

## 2010-12-15 ENCOUNTER — Encounter: Payer: Self-pay | Admitting: Family Medicine

## 2010-12-15 ENCOUNTER — Ambulatory Visit (INDEPENDENT_AMBULATORY_CARE_PROVIDER_SITE_OTHER): Payer: Medicaid Other | Admitting: Family Medicine

## 2010-12-15 DIAGNOSIS — R0602 Shortness of breath: Secondary | ICD-10-CM

## 2010-12-15 DIAGNOSIS — M79609 Pain in unspecified limb: Secondary | ICD-10-CM

## 2010-12-15 DIAGNOSIS — R232 Flushing: Secondary | ICD-10-CM

## 2010-12-15 DIAGNOSIS — N951 Menopausal and female climacteric states: Secondary | ICD-10-CM

## 2010-12-15 DIAGNOSIS — E669 Obesity, unspecified: Secondary | ICD-10-CM

## 2010-12-15 LAB — FOLLICLE STIMULATING HORMONE: FSH: 8.2 m[IU]/mL

## 2010-12-15 LAB — LUTEINIZING HORMONE: LH: 2.62 m[IU]/mL

## 2010-12-15 MED ORDER — CLONAZEPAM 1 MG PO TABS: 1.0000 mg | ORAL_TABLET | Freq: Every evening | ORAL | Status: DC | PRN

## 2010-12-15 NOTE — Progress Notes (Signed)
  Subjective:    Patient ID: [name], female    DOB: January 28, 1971, 40 y.o.   MRN: [id number]  HPI Obesity- weight is stable from last visit even after taking course of prednisone.  Still waiting for bariatric surgery.  SOB- had severe symptoms on Saturday and Sunday, had flushing on Sunday w/ excessive perspiration.  sxs have improved.  Denies current SOB.  No CP.  Has never had PFTs.  No hx of asthma/COPD.  Has had 5-10 episodes of similar.  Only occur w/ exertion.  Wondering if she is having menopausal sxs (s/p hysterectomy).  Neuropathy- pt reports she is maxed out on neurontin, taking the pain pill and still having severe pain.  Waking up at 2 am to have someone rub her leg.  Feel that alprazolam helps her to fall asleep but doesn't keep her asleep.   Review of Systems For ROS see HPI     Objective:   Physical Exam  Vitals reviewed. Constitutional: She is oriented to person, place, and time. She appears well-developed and well-nourished. No distress.       Morbidly obese  Neck: Normal range of motion. Neck supple. No thyromegaly present.  Cardiovascular: Normal rate, regular rhythm, normal heart sounds and intact distal pulses.   Pulmonary/Chest: Effort normal and breath sounds normal. No respiratory distress. She has no wheezes. She has no rales.  Lymphadenopathy:    She has no cervical adenopathy.  Neurological: She is alert and oriented to person, place, and time. No cranial nerve deficit.  Skin: Skin is warm and dry.  Psychiatric: She has a normal mood and affect. Her behavior is normal.          Assessment & Plan:

## 2010-12-15 NOTE — Patient Instructions (Signed)
Follow up in 1 month to recheck STOP the alprazolam at night, START the Klonopin nightly for leg pain We'll notify you of your lab results Call with any questions or concerns Hang in there!

## 2010-12-16 ENCOUNTER — Other Ambulatory Visit: Payer: Self-pay | Admitting: Family Medicine

## 2010-12-16 DIAGNOSIS — F411 Generalized anxiety disorder: Secondary | ICD-10-CM

## 2010-12-17 ENCOUNTER — Ambulatory Visit (INDEPENDENT_AMBULATORY_CARE_PROVIDER_SITE_OTHER): Payer: Medicaid Other | Admitting: Pulmonary Disease

## 2010-12-17 DIAGNOSIS — G4733 Obstructive sleep apnea (adult) (pediatric): Secondary | ICD-10-CM

## 2010-12-17 MED ORDER — BUPROPION HCL ER (XL) 150 MG PO TB24: 150.0000 mg | ORAL_TABLET | Freq: Every day | ORAL | Status: DC

## 2010-12-17 NOTE — Telephone Encounter (Signed)
Ok for #30, 6 refills 

## 2010-12-17 NOTE — Telephone Encounter (Signed)
Faxed.   KP 

## 2010-12-18 ENCOUNTER — Telehealth: Payer: Self-pay | Admitting: Pulmonary Disease

## 2010-12-18 NOTE — Progress Notes (Signed)
The pt underwent home sleep testing with a type 3 portable device.  Airflow, effort, oxygen saturation, and pulse rate were all monitored during this time.  The raw data and tracings have been reviewed, with the following findings:  1) flow evaluation period of 3hrs and 2) the pt was found to have 74 apneas that were predominately obstructive, as well as 63 obstructive hypopneas.  AHI 37/hr. 3) oxygen desaturation as low as 62%, with spent during the night less than or equal to 88%

## 2010-12-18 NOTE — Assessment & Plan Note (Signed)
The pt has moderate to severe osa by her recent sleep study, and will require aggressive treatment.  Will arrange for an OV to discuss results and treatment options.

## 2010-12-18 NOTE — Telephone Encounter (Signed)
Pt will need ov this week to discuss sleep study results.

## 2010-12-19 ENCOUNTER — Encounter: Payer: Self-pay | Admitting: Family Medicine

## 2010-12-19 NOTE — Assessment & Plan Note (Signed)
Check FSH/LH to assess hormonal status.

## 2010-12-19 NOTE — Assessment & Plan Note (Signed)
Encouraged pt to discuss this w/ pulmonologist at upcoming appt.  Will likely need PFTs.

## 2010-12-19 NOTE — Assessment & Plan Note (Signed)
Switch from alprazolam to klonopin to control neuropathic pain

## 2010-12-19 NOTE — Assessment & Plan Note (Signed)
Pt has not lost weight but has not gained any either despite course of oral steroids.  Will continue to follow until pt is able to get her bariatric surgery.

## 2010-12-20 ENCOUNTER — Ambulatory Visit (INDEPENDENT_AMBULATORY_CARE_PROVIDER_SITE_OTHER): Payer: Medicaid Other | Admitting: Pulmonary Disease

## 2010-12-20 ENCOUNTER — Telehealth: Payer: Self-pay | Admitting: Pulmonary Disease

## 2010-12-20 DIAGNOSIS — G4733 Obstructive sleep apnea (adult) (pediatric): Secondary | ICD-10-CM

## 2010-12-20 NOTE — Progress Notes (Signed)
The pt underwent home sleep testing with a type 3 portable device.  Airflow, respiratory effort, oximetry, and pulse rate were all monitored during this time.  The pt's raw data and tracings have been reviewed with the following findings:  1) flow evaluation period of 3hrs and 2) the pt was found to have 74 apneas and 63 hypopneas, giving her an AHI 37/hr.   3) lowest oxygen saturation noted during study was 62%, and were spent overall less than or equal to 88%

## 2010-12-20 NOTE — Telephone Encounter (Signed)
Pt needs ov this week or next week to review sleep study results and discuss treatment.  Thanks.

## 2010-12-20 NOTE — Assessment & Plan Note (Signed)
The pt has moderate to severe osa by her sleep study, and would do best with a trial of cpap while working on weight loss.  Will arrange for ov to discuss results and treatment options.

## 2010-12-21 ENCOUNTER — Telehealth: Payer: Self-pay | Admitting: Pulmonary Disease

## 2010-12-21 NOTE — Telephone Encounter (Signed)
Per KC Pt needs ov this week or next week to review sleep study results and discuss treatment.  Thanks.

## 2010-12-21 NOTE — Telephone Encounter (Signed)
PT RETURNED CALL FROM TRIAGE. I HAVE SCHEDULED HER A F/U WITH KC FOR THIS THURS TO GO OVER RESULTS. NOTHING FURTHER NEEDED PER PT. [name]

## 2010-12-21 NOTE — Telephone Encounter (Signed)
[email or web address]  

## 2010-12-23 ENCOUNTER — Ambulatory Visit (INDEPENDENT_AMBULATORY_CARE_PROVIDER_SITE_OTHER): Payer: Medicaid Other | Admitting: Pulmonary Disease

## 2010-12-23 ENCOUNTER — Encounter: Payer: Self-pay | Admitting: Pulmonary Disease

## 2010-12-23 VITALS — BP 116/72 | HR 110 | Temp 98.1°F | Ht 62.5 in | Wt 370.2 lb

## 2010-12-23 DIAGNOSIS — G4733 Obstructive sleep apnea (adult) (pediatric): Secondary | ICD-10-CM

## 2010-12-23 NOTE — Progress Notes (Signed)
  Subjective:    Patient ID: [name], female    DOB: Mar 08, 1971, 40 y.o.   MRN: [id number]  HPI The patient comes in today for followup after her recent home sleep test.  She was found to have moderate to severe sleep apnea, with an AHI of 37 events per hour.  I've reviewed the study in detail with her, and answered all of her questions.   Review of Systems  Constitutional: Negative for fever and unexpected weight change.  HENT: Negative for ear pain, nosebleeds, congestion, sore throat, rhinorrhea, sneezing, trouble swallowing, dental problem, postnasal drip and sinus pressure.   Eyes: Negative for redness and itching.  Respiratory: Positive for shortness of breath. Negative for cough, chest tightness and wheezing.   Cardiovascular: Positive for palpitations and leg swelling.  Gastrointestinal: Negative for nausea and vomiting.  Genitourinary: Negative for dysuria.  Musculoskeletal: Positive for joint swelling.  Skin: Negative for rash.  Neurological: Positive for headaches.  Hematological: Does not bruise/bleed easily.  Psychiatric/Behavioral: Negative for dysphoric mood. The patient is not nervous/anxious.        Objective:   Physical Exam Overweight female in no acute distress nares without obvious discharge or purulence. Lower extremities with no significant edema, no cyanosis noted Alert and oriented, moves all 4 extremities.       Assessment & Plan:

## 2010-12-23 NOTE — Patient Instructions (Signed)
Will start on cpap at moderate pressure level.  Please call if having tolerance issues. Work on weight loss followup with me in 5 weeks, and will set up for apptm to also discuss your breathing issues.

## 2010-12-24 NOTE — Telephone Encounter (Signed)
Pt seen and PSG was discussed.

## 2010-12-26 ENCOUNTER — Encounter: Payer: Self-pay | Admitting: Pulmonary Disease

## 2010-12-26 NOTE — Assessment & Plan Note (Signed)
The patient has moderate to severe objective sleep apnea by her recent sleep study.  She would be best treated with a trial of CPAP while working on weight loss.  She is agreeable to trying this. I will set the patient up on cpap at a moderate pressure level to allow for desensitization, and will troubleshoot the device over the next 4-6weeks if needed.  The pt is to call me if having issues with tolerance.  Will then optimize the pressure once patient is able to wear cpap on a consistent basis.

## 2010-12-28 ENCOUNTER — Encounter: Payer: Self-pay | Admitting: Pulmonary Disease

## 2010-12-31 ENCOUNTER — Other Ambulatory Visit: Payer: Self-pay | Admitting: Family Medicine

## 2010-12-31 MED ORDER — PROMETHAZINE HCL 25 MG PO TABS: 25.0000 mg | ORAL_TABLET | Freq: Four times a day (QID) | ORAL | Status: DC | PRN

## 2010-12-31 NOTE — Telephone Encounter (Signed)
rx called into pharmacy

## 2010-12-31 NOTE — Telephone Encounter (Signed)
Ok for #30, 1 refill 

## 2011-01-11 ENCOUNTER — Other Ambulatory Visit: Payer: Self-pay | Admitting: Family Medicine

## 2011-01-11 MED ORDER — GABAPENTIN 600 MG PO TABS: 600.0000 mg | ORAL_TABLET | Freq: Three times a day (TID) | ORAL | Status: DC

## 2011-01-11 MED ORDER — CLONAZEPAM 1 MG PO TABS: 1.0000 mg | ORAL_TABLET | Freq: Every evening | ORAL | Status: DC | PRN

## 2011-01-11 NOTE — Telephone Encounter (Signed)
Ok to refill Clonazepam #30, 2 refills Gabapentin #90, 3 refills

## 2011-01-11 NOTE — Telephone Encounter (Signed)
Rx faxed

## 2011-01-11 NOTE — Telephone Encounter (Signed)
Last ov 12-15-10, last filled 12-15-10 #30, gabapentin 07-19-10 #90 3

## 2011-01-20 ENCOUNTER — Telehealth: Payer: Self-pay

## 2011-01-20 NOTE — Telephone Encounter (Signed)
Message from pt c/o ulcer/blister on uvula. She notes that she has seen md about this recently and would like to know if md will rx abx and steroid that was rx'd last time. She also states that she read the cymbalta warnings which include mouth ulcers or blisters. Please advise

## 2011-01-20 NOTE — Telephone Encounter (Signed)
Since we can't evaluate the severity of her ulcer/swelling she will need to go to UC.

## 2011-01-20 NOTE — Telephone Encounter (Signed)
Per Dr. Beverely Low, pt needs to go to an urgent care in [place] since she can't come here tomorrow.  Pt notified

## 2011-01-24 ENCOUNTER — Encounter: Payer: Self-pay | Admitting: Family Medicine

## 2011-01-24 ENCOUNTER — Ambulatory Visit (INDEPENDENT_AMBULATORY_CARE_PROVIDER_SITE_OTHER): Payer: Medicaid Other | Admitting: Family Medicine

## 2011-01-24 DIAGNOSIS — IMO0001 Reserved for inherently not codable concepts without codable children: Secondary | ICD-10-CM

## 2011-01-24 DIAGNOSIS — E669 Obesity, unspecified: Secondary | ICD-10-CM

## 2011-01-24 DIAGNOSIS — R51 Headache: Secondary | ICD-10-CM

## 2011-01-24 NOTE — Patient Instructions (Signed)
Follow up in 1 month Restart the Cymbalta daily Use the Relpax if you develop severe headache- may repeat in 2 hrs if symptoms continue Drink plenty of water Call with any questions or concerns Hang in there!

## 2011-01-24 NOTE — Progress Notes (Signed)
  Subjective:    Patient ID: [name], female    DOB: [birth year], 40 y.o.   MRN: [id number]  HPI HA- started Friday night, 'feels like my head is in a vise'.  Will wake her up.  Stopped Cymbalta 1 week ago- had associated dizziness, HAs, nausea, rebound of fibromyalgia pain.  Had photo and phonophobia.  Felt similar to previous migraine.  Obesity- pt's weight is unchanged.  Pt is pleased this hasn't increased due to increase in pain recently and inability to exercise.  Fibro- pt's sxs were much improved after starting Cymbalta.  Reports for 1st time in years it was 'manageable'.  After stopping cymbalta had rebound flare of pain.  'it can't be like this- i have to take the medicine'.  Review of Systems For ROS see HPI     Objective:   Physical Exam  Vitals reviewed. Constitutional: She is oriented to person, place, and time. She appears well-developed and well-nourished. No distress.  HENT:  Head: Normocephalic and atraumatic.  Nose: Nose normal.  Mouth/Throat: No oropharyngeal exudate.       No TTP over sinuses TMs normal bilaterally  Eyes: Conjunctivae and EOM are normal. Pupils are equal, round, and reactive to light.  Neck: Normal range of motion. Neck supple.  Cardiovascular: Normal rate, regular rhythm and normal heart sounds.   Pulmonary/Chest: Effort normal and breath sounds normal. No respiratory distress. She has no wheezes. She has no rales.  Musculoskeletal: She exhibits tenderness (over multiple trigger points). She exhibits no edema.  Neurological: She is alert and oriented to person, place, and time. No cranial nerve deficit. Coordination normal.  Skin: Skin is warm and dry.  Psychiatric: She has a normal mood and affect. Her behavior is normal. Judgment and thought content normal.          Assessment & Plan:

## 2011-01-30 NOTE — Assessment & Plan Note (Signed)
Pt has not lost weight in the last month but she has not gained either.  Still waiting for word on gastric bypass.  Will continue to monitor until surgery date.

## 2011-01-30 NOTE — Assessment & Plan Note (Signed)
Most likely due to cymbalta withdraw but she does have hx of migraines.  Neuro exam WNL today and no red flags on hx or PE.  Samples of Relpax given to tx possible migraine.  Reviewed supportive care and red flags that should prompt return.  Pt expressed understanding and is in agreement w/ plan.

## 2011-01-30 NOTE — Assessment & Plan Note (Signed)
Pt's sxs improved after starting Cymbalta and flared after stopping med.  Plan is to restart cymbalta and monitor sxs and side effects (pt feels that she gets mouth ulcers from med).  Will follow closely.

## 2011-02-03 ENCOUNTER — Ambulatory Visit (INDEPENDENT_AMBULATORY_CARE_PROVIDER_SITE_OTHER): Payer: Medicaid Other | Admitting: Pulmonary Disease

## 2011-02-03 ENCOUNTER — Telehealth: Payer: Self-pay

## 2011-02-03 ENCOUNTER — Encounter: Payer: Self-pay | Admitting: Pulmonary Disease

## 2011-02-03 ENCOUNTER — Ambulatory Visit (INDEPENDENT_AMBULATORY_CARE_PROVIDER_SITE_OTHER)
Admission: RE | Admit: 2011-02-03 | Discharge: 2011-02-03 | Disposition: A | Discharge status: Home / Self Care | Payer: Medicaid Other | Source: Ambulatory Visit | Attending: Pulmonary Disease | Admitting: Pulmonary Disease

## 2011-02-03 VITALS — BP 132/82 | HR 92 | Temp 97.5°F | Ht 62.5 in | Wt 377.0 lb

## 2011-02-03 DIAGNOSIS — R0602 Shortness of breath: Secondary | ICD-10-CM

## 2011-02-03 DIAGNOSIS — G4733 Obstructive sleep apnea (adult) (pediatric): Secondary | ICD-10-CM

## 2011-02-03 MED ORDER — CYCLOBENZAPRINE HCL 10 MG PO TABS: 10.0000 mg | ORAL_TABLET | Freq: Three times a day (TID) | ORAL | Status: DC | PRN

## 2011-02-03 MED ORDER — ALPRAZOLAM 0.5 MG PO TABS: 0.5000 mg | ORAL_TABLET | Freq: Three times a day (TID) | ORAL | Status: DC | PRN

## 2011-02-03 NOTE — Patient Instructions (Signed)
Will have your cpap machine changed to auto setting for 2 weeks, and will let you know your optimal pressure. Will order cxr today, and will call you with results. Will schedule for full pfts, and have you see me the same day for review.

## 2011-02-03 NOTE — Telephone Encounter (Signed)
Pt aware and xanax refill called in

## 2011-02-03 NOTE — Telephone Encounter (Signed)
Pt called to express interest in th new diet pill that was just approved by the FDA. She also wants to know if she can take xanax during the day if she needs one because she takes the Klonipin at night. Lastly, pt requested refill of flexeril.  Per Dr. Beverely Low, refill flexeril #90 with 0 refills. Pt can take the Klonipin at night and xanax during the day prn. Diet pill is not available in pharmacies and it is $180 out-of-pocket.

## 2011-02-03 NOTE — Assessment & Plan Note (Signed)
The patient is slowly adapting to CPAP, but denies any issues with mask fit or pressure.  I suspect she is having breakthrough apneas on suboptimal pressure, and this leads to her pulling the mask off during the night.  At this point, will optimize her pressure on automatic mode for the next 2 weeks.  I've also encouraged her to work aggressively on weight loss.

## 2011-02-03 NOTE — Assessment & Plan Note (Signed)
The patient is complaining of progressive shortness of breath over the last one year, and it is now interfering with her activities of daily living.  She does not have any history that is suggestive of asthma, but she may have pulmonary hypertension related to diastolic dysfunction and her known sleep apnea.  Will go ahead and check a chest x-ray today, and schedule for full PFTs.  If these are unremarkable, would consider a followup echocardiogram to look at pulmonary pressures.  I have also explained to her the role of centripetal obesity, and how it effects the mechanics of breathing.

## 2011-02-03 NOTE — Progress Notes (Signed)
  Subjective:    Patient ID: [name], female    DOB: 1971-01-31, 40 y.o.   MRN: [id number]  HPI The patient comes in today for followup of her known sleep apnea, and also for a new evaluation of dyspnea on exertion.  She is been wearing CPAP compliantly since the last visit, but is having some breakthrough apnea where she pulls the mask off due to a feeling of suffocation.  She feels the mask is adequate and has a good fit, and has no issues with pressure intolerance.  She does not feel that she has worn a night to see if it is going to help her sleep or daytime alertness.  I have reminded her that we have yet to optimize her pressure.   The new issue today is dyspnea on exertion for the last one year, but she believes this is getting worse.She describes shortness of breath getting her groceries or shopping, and thinks she can only walk a little over one block before she gets severely short of breath.  She denies any cough, chest congestion, or mucus.  She does have persistent lower extremity edema despite being on diuretics, and had an echo and stress test in 2008 which we are trying to locate.  She also complains of chronic leg pain due to neuropathy which also limits her walking.  She tells me that her weight is up 75 pounds over the last one year.  She has no history of asthma, has never had PFTs, and has not had a chest x-ray for at least 4 years.   Review of Systems  Constitutional: Negative for fever and unexpected weight change.  HENT: Negative for ear pain, nosebleeds, congestion, sore throat, rhinorrhea, sneezing, trouble swallowing, dental problem, postnasal drip and sinus pressure.   Eyes: Negative for redness and itching.  Respiratory: Positive for shortness of breath and wheezing. Negative for cough and chest tightness.   Cardiovascular: Negative for palpitations and leg swelling.  Gastrointestinal: Negative for nausea and vomiting.  Genitourinary: Negative for dysuria.    Musculoskeletal: Negative for joint swelling.  Skin: Negative for rash.  Neurological: Negative for headaches.  Hematological: Does not bruise/bleed easily.  Psychiatric/Behavioral: Negative for dysphoric mood. The patient is not nervous/anxious.        Objective:   Physical Exam  Morbidly obese female in no acute distress  HENT:  Nares patent without discharge  Oropharynx without exudate, palate and uvula are elongated  Eyes:  Perrla, eomi, no scleral icterus  Neck:  No JVD, no TMG  Cardiovascular:  Normal rate, regular rhythm, no rubs or gallops.  No murmurs        Intact distal pulses  Pulmonary :  Normal breath sounds, no stridor or respiratory distress   No rales, rhonchi, or wheezing  Abdominal:  Soft, nondistended, bowel sounds present.  No tenderness noted.   Musculoskeletal:  1+ lower extremity edema noted.  Lymph Nodes:  No cervical lymphadenopathy noted  Skin:  No cyanosis noted  Neurologic:  Alert, appropriate, moves all 4 extremities without obvious deficit.         Assessment & Plan:

## 2011-02-07 ENCOUNTER — Telehealth: Payer: Self-pay | Admitting: Pulmonary Disease

## 2011-02-07 NOTE — Telephone Encounter (Signed)
PATIENT RETURNED MEGAN'S CALL.  PLEASE CALL BACK AT [phone]

## 2011-02-08 NOTE — Telephone Encounter (Signed)
Called and spoke with pt.  Pt aware of cxr results.  Nothing further needed.   

## 2011-02-08 NOTE — Telephone Encounter (Signed)
Pt is requesting to "touch base" with Aundra Millet & asked to be reached at [phone].  Antionette Fairy

## 2011-02-08 NOTE — Telephone Encounter (Signed)
PT CALLED BACK AGAIN. Kathleen W Perdue  °

## 2011-02-14 ENCOUNTER — Other Ambulatory Visit: Payer: Self-pay | Admitting: Family Medicine

## 2011-02-14 MED ORDER — HYDROCODONE-ACETAMINOPHEN 5-500 MG PO TABS: 1.0000 | ORAL_TABLET | ORAL | Status: DC

## 2011-02-14 NOTE — Telephone Encounter (Signed)
Last filled 12/13/10 with 1 rf. Last OV 12/15/10

## 2011-02-14 NOTE — Telephone Encounter (Signed)
Ok for #60 w/ 1 refill but this should be as needed, not for regular use

## 2011-02-14 NOTE — Telephone Encounter (Signed)
Rx phoned in.   

## 2011-02-15 ENCOUNTER — Telehealth: Payer: Self-pay | Admitting: Pulmonary Disease

## 2011-02-15 ENCOUNTER — Other Ambulatory Visit: Payer: Self-pay | Admitting: Pulmonary Disease

## 2011-02-15 DIAGNOSIS — G4733 Obstructive sleep apnea (adult) (pediatric): Secondary | ICD-10-CM

## 2011-02-15 NOTE — Telephone Encounter (Signed)
States [facility] changed her pressure on Friday, was unable to use machine Saturday due to power outage, used machine Sunday and Monday night and felt like she was suffocating with the pressure of 4. Able to feel the air on her hand when checking to see if it is working, however, can not feel it when mask is placed on her face, and did not feel this way when pressure was originally on 10. Pt states machine is working properly just feels the pressure is not enough. Please advise. Thanks.

## 2011-02-15 NOTE — Telephone Encounter (Signed)
Let her know we can increase the bottom pressure while on auto.  When she falls asleep, the pressure usually ramps up pretty quickly.  Will send order to DME>

## 2011-02-15 NOTE — Telephone Encounter (Signed)
Called, spoke with pt.  I informed her KC recs to increase the bottom pressure while on auto and when she falls asleep, the pressure should usually ramp up pretty quickly.  Advised order has already been placed for this change so she will receive another call regarding this.  She verbalized understanding of this and will call back if anything further is needed.

## 2011-02-17 ENCOUNTER — Other Ambulatory Visit: Payer: Self-pay | Admitting: Family Medicine

## 2011-02-17 MED ORDER — NEBIVOLOL HCL 5 MG PO TABS: 5.0000 mg | ORAL_TABLET | Freq: Every day | ORAL | Status: DC

## 2011-02-17 NOTE — Telephone Encounter (Signed)
Done

## 2011-02-21 ENCOUNTER — Ambulatory Visit (INDEPENDENT_AMBULATORY_CARE_PROVIDER_SITE_OTHER): Payer: Medicaid Other | Admitting: Pulmonary Disease

## 2011-02-21 ENCOUNTER — Encounter: Payer: Self-pay | Admitting: Pulmonary Disease

## 2011-02-21 VITALS — BP 138/92 | HR 111 | Temp 97.9°F | Ht 63.0 in | Wt 367.0 lb

## 2011-02-21 DIAGNOSIS — R0602 Shortness of breath: Secondary | ICD-10-CM

## 2011-02-21 LAB — PULMONARY FUNCTION TEST

## 2011-02-21 NOTE — Assessment & Plan Note (Signed)
The patient's PFTs showed no obstruction or restriction, and her decreased DLCO may just be secondary to her obesity.  The other considerations would be cardiac disease, pulmonary hypertension, and possibly chronic thromboembolic disease.  At this point, I would like to check an echocardiogram to evaluate for some of these entities.  I have also encouraged her to work aggressively on weight loss.

## 2011-02-21 NOTE — Patient Instructions (Signed)
Will get set up for echo of your heart to look for pulmonary hypertension, check on your heart function. Continue with cpap, work on weight loss.

## 2011-02-21 NOTE — Progress Notes (Signed)
PFT done today. 

## 2011-02-21 NOTE — Progress Notes (Signed)
  Subjective:    Patient ID: [name], female    DOB: 04-19-1971, 40 y.o.   MRN: [id number]  HPI The patient comes in today for review of her recent pulmonary function studies,ordered as a part of a workup for dyspnea on exertion.  She was found to have no air flow structured, no restriction, and a moderate decrease in diffusion capacity that corrected to normal with alveolar volume adjustment.  I have reviewed this study with her in detail, and answered all of her questions.   Review of Systems  Constitutional: Negative for fever and unexpected weight change.  HENT: Negative for ear pain, nosebleeds, congestion, sore throat, rhinorrhea, sneezing, trouble swallowing, dental problem, postnasal drip and sinus pressure.   Eyes: Negative for redness and itching.  Respiratory: Positive for shortness of breath. Negative for cough, chest tightness and wheezing.   Cardiovascular: Negative for palpitations and leg swelling.  Gastrointestinal: Negative for nausea and vomiting.  Genitourinary: Negative for dysuria.  Musculoskeletal: Negative for joint swelling.  Skin: Negative for rash.  Neurological: Negative for headaches.  Hematological: Does not bruise/bleed easily.  Psychiatric/Behavioral: Negative for dysphoric mood. The patient is not nervous/anxious.        Objective:   Physical Exam Morbidly obese female in no acute distress Nose without purulence or discharge Lower extremities with edema noted, no cyanosis Alert and oriented, moves all 4 extremities.       Assessment & Plan:

## 2011-02-22 ENCOUNTER — Telehealth: Payer: Self-pay

## 2011-02-22 NOTE — Telephone Encounter (Signed)
Pt states that she is out of Relpax samples and would like an Rx sent to pharmacy. Please advise

## 2011-02-23 ENCOUNTER — Other Ambulatory Visit: Payer: Self-pay | Admitting: Family Medicine

## 2011-02-23 MED ORDER — ELETRIPTAN HYDROBROMIDE 40 MG PO TABS: 40.0000 mg | ORAL_TABLET | ORAL | Status: DC | PRN

## 2011-02-23 NOTE — Telephone Encounter (Signed)
Ok for [employer] #15, 3 refills

## 2011-02-23 NOTE — Telephone Encounter (Signed)
Rx has been faxed to pharmacy.

## 2011-02-23 NOTE — Telephone Encounter (Signed)
Patient was given sample of relpax - she said it helped & would like rx called in - [facility]

## 2011-02-23 NOTE — Telephone Encounter (Signed)
Rx done. 

## 2011-03-01 ENCOUNTER — Ambulatory Visit (HOSPITAL_COMMUNITY): Payer: Medicaid Other | Attending: Pulmonary Disease | Admitting: Radiology

## 2011-03-01 DIAGNOSIS — E785 Hyperlipidemia, unspecified: Secondary | ICD-10-CM | POA: Insufficient documentation

## 2011-03-01 DIAGNOSIS — I2789 Other specified pulmonary heart diseases: Secondary | ICD-10-CM | POA: Insufficient documentation

## 2011-03-01 DIAGNOSIS — R0602 Shortness of breath: Secondary | ICD-10-CM | POA: Insufficient documentation

## 2011-03-01 DIAGNOSIS — I059 Rheumatic mitral valve disease, unspecified: Secondary | ICD-10-CM | POA: Insufficient documentation

## 2011-03-02 ENCOUNTER — Telehealth: Payer: Self-pay | Admitting: Pulmonary Disease

## 2011-03-02 ENCOUNTER — Telehealth: Payer: Self-pay | Admitting: *Deleted

## 2011-03-02 MED ORDER — SUMATRIPTAN SUCCINATE 100 MG PO TABS: 100.0000 mg | ORAL_TABLET | ORAL | Status: DC | PRN

## 2011-03-02 NOTE — Telephone Encounter (Signed)
Result Notes     Notes Recorded by Barbaraann Share, MD on 03/02/2011 at 10:40 AM Please let pt know that her echo did not show any pulmonary htn, but did show that her heart muscle is thickened and does not relax completely. She is already on medications for this (bystolic and lasix). I would recommend working on weight loss and conditioning as we discussed, and will send a note to Dr. Beverely Low letting her know.

## 2011-03-02 NOTE — Telephone Encounter (Signed)
Please call and ask pt if she has tried either- she has been on multiple meds in the past so i wouldn't be surprised if she has taken these.

## 2011-03-02 NOTE — Telephone Encounter (Signed)
Discuss with patient, Rx sent to pharmacy. 

## 2011-03-02 NOTE — Telephone Encounter (Signed)
Pt notified of Echo results per Dr. Shelle Iron. and verbalized understanding

## 2011-03-02 NOTE — Telephone Encounter (Signed)
Spoke with Pt who states that she has not tried any other meds beside ones listed on chart.

## 2011-03-02 NOTE — Telephone Encounter (Signed)
Pt must have had a trial with Imitrex or Maxalt and failed in order to approve coverage for Replax. .Please advise

## 2011-03-02 NOTE — Telephone Encounter (Signed)
Ok to do script for Imitrex 100mg , take 1 tab as needed for migraine.  #15, 1 refill

## 2011-03-15 ENCOUNTER — Telehealth: Payer: Self-pay | Admitting: Pulmonary Disease

## 2011-03-15 NOTE — Telephone Encounter (Signed)
KC- pt is unsure when to follow up with you if at all- pt was last seen 02-21-2011 and then had Echo-was given results on 10-17ish but never told when to follow up with you. Please advise. Thanks.

## 2011-03-15 NOTE — Telephone Encounter (Signed)
In terms of her breathing, I can see nothing from a pulmonary standpoint to explain her shortness of breath.  I suspect this is due to her weight/conditioning, and her thick heart muscle as we discussed. She does need f/u with me in one year for her sleep apnea.  I will call her with results of her download as soon as I receive the report.

## 2011-03-16 NOTE — Telephone Encounter (Signed)
[email or web address]  

## 2011-03-17 ENCOUNTER — Encounter: Payer: Self-pay | Admitting: Family Medicine

## 2011-03-17 ENCOUNTER — Ambulatory Visit (INDEPENDENT_AMBULATORY_CARE_PROVIDER_SITE_OTHER): Payer: Medicaid Other | Admitting: Family Medicine

## 2011-03-17 DIAGNOSIS — E669 Obesity, unspecified: Secondary | ICD-10-CM

## 2011-03-17 DIAGNOSIS — E785 Hyperlipidemia, unspecified: Secondary | ICD-10-CM

## 2011-03-17 DIAGNOSIS — Z23 Encounter for immunization: Secondary | ICD-10-CM

## 2011-03-17 DIAGNOSIS — G4733 Obstructive sleep apnea (adult) (pediatric): Secondary | ICD-10-CM

## 2011-03-17 DIAGNOSIS — I10 Essential (primary) hypertension: Secondary | ICD-10-CM

## 2011-03-17 LAB — LIPID PANEL
Cholesterol: 236 mg/dL — ABNORMAL HIGH (ref 0–200)
HDL: 34.6 mg/dL — ABNORMAL LOW (ref >39.00)
Total CHOL/HDL Ratio: 7
Triglycerides: 332 mg/dL — ABNORMAL HIGH (ref 0.0–149.0)
VLDL: 66.4 mg/dL — ABNORMAL HIGH (ref 0.0–40.0)

## 2011-03-17 LAB — BASIC METABOLIC PANEL
BUN: 14 mg/dL (ref 6–23)
Creatinine, Ser: 0.4 mg/dL (ref 0.4–1.2)
GFR: 167.26 mL/min (ref >60.00)
Potassium: 4.7 mEq/L (ref 3.5–5.1)

## 2011-03-17 LAB — HEPATIC FUNCTION PANEL: Total Bilirubin: 0.4 mg/dL (ref 0.3–1.2)

## 2011-03-17 NOTE — Patient Instructions (Signed)
Follow up in 1 month to recheck weight loss Keep me updated on the status of the weight loss surgery We'll notify you of your lab results and make any changes if needed Call with any questions or concerns Hang in there!!!!

## 2011-03-17 NOTE — Telephone Encounter (Signed)
Pt is aware of KC recs and a reminder has been placed for her follow-up in 1 year for sleep apnea.

## 2011-03-17 NOTE — Telephone Encounter (Signed)
LMOMTCB x 1 

## 2011-03-17 NOTE — Progress Notes (Signed)
  Subjective:    Patient ID: [name], female    DOB: 05-Apr-1971, 40 y.o.   MRN: [id number]  HPI Obesity- pt has gained another 8 lbs.  Is supposed to call [facility] on Nov 14 to see if Medicaid is going to pay for gastric sleeve.  HTN- chronic problem for pt, well controlled today.  On Bystolic, Lasix.  No CP, some SOB but not above baseline, HAs no more than usual, edema.  Hyperlipidemia- chronic problem, on Crestor.  No abd pain, N/V, myalgias.  Due for labs.   Review of Systems For ROS see HPI     Objective:   Physical Exam  Vitals reviewed. Constitutional: She is oriented to person, place, and time. She appears well-developed and well-nourished. No distress.       Morbidly obese  HENT:  Head: Normocephalic and atraumatic.  Eyes: Conjunctivae and EOM are normal. Pupils are equal, round, and reactive to light.  Neck: Normal range of motion. Neck supple. No thyromegaly present.  Cardiovascular: Normal rate, regular rhythm, normal heart sounds and intact distal pulses.   No murmur heard. Pulmonary/Chest: Effort normal and breath sounds normal. No respiratory distress.  Abdominal: Soft. She exhibits no distension. There is no tenderness.  Musculoskeletal: She exhibits no edema.  Lymphadenopathy:    She has no cervical adenopathy.  Neurological: She is alert and oriented to person, place, and time.  Skin: Skin is warm and dry.  Psychiatric: She has a normal mood and affect. Her behavior is normal.          Assessment & Plan:

## 2011-03-21 ENCOUNTER — Encounter (HOSPITAL_COMMUNITY): Payer: Self-pay | Admitting: Emergency Medicine

## 2011-03-21 ENCOUNTER — Emergency Department (HOSPITAL_COMMUNITY)
Admission: EM | Admit: 2011-03-21 | Discharge: 2011-03-21 | Discharge status: Against Medical Advice | Payer: Medicaid Other | Attending: Emergency Medicine | Admitting: Emergency Medicine

## 2011-03-21 DIAGNOSIS — G4730 Sleep apnea, unspecified: Secondary | ICD-10-CM | POA: Insufficient documentation

## 2011-03-21 DIAGNOSIS — T50Z95A Adverse effect of other vaccines and biological substances, initial encounter: Secondary | ICD-10-CM | POA: Insufficient documentation

## 2011-03-21 DIAGNOSIS — G43909 Migraine, unspecified, not intractable, without status migrainosus: Secondary | ICD-10-CM | POA: Insufficient documentation

## 2011-03-21 DIAGNOSIS — M79609 Pain in unspecified limb: Secondary | ICD-10-CM | POA: Insufficient documentation

## 2011-03-21 HISTORY — DX: Migraine, unspecified, not intractable, without status migrainosus: G43.909

## 2011-03-21 HISTORY — DX: Sleep apnea, unspecified: G47.30

## 2011-03-21 NOTE — ED Notes (Signed)
Pt here for reaction to PNA vac; pt with redness warmth and pain in right upper arm; pt sts spreading so sent by PCP for eval

## 2011-03-22 NOTE — Assessment & Plan Note (Signed)
Pt continues to gain weight despite her reported weight loss efforts.  She is waiting to hear about insurance coverage for weight loss surgery.  Fear that pt doesn't have the discipline needed to eat appropriately after surgery as she is not able to lose or even maintain her weight.  Will continue to follow closely.

## 2011-03-22 NOTE — Assessment & Plan Note (Signed)
Chronic problem for pt.  Tolerating statin w/out difficulty.  Check labs.  Adjust meds prn  

## 2011-03-22 NOTE — Assessment & Plan Note (Signed)
Chronic problem.  Fairly well controlled.  Asymptomatic.  No med changes at this time.

## 2011-03-22 NOTE — Assessment & Plan Note (Signed)
Given chronic lung dz will proceed w/ pneumovax for pt.

## 2011-03-23 ENCOUNTER — Telehealth: Payer: Self-pay | Admitting: Family Medicine

## 2011-03-23 NOTE — Telephone Encounter (Signed)
Last OV 03-17-11 last refills Hydrocodone-02-14-11, alpraz-02-03-11, clonzapam 01-11-11, bystolic 02-17-11, flexerill 02-03-11

## 2011-03-23 NOTE — Telephone Encounter (Signed)
Ok to refill all.  Controlled substances and cyclobenzaprine x1, bystolic x6

## 2011-03-24 ENCOUNTER — Other Ambulatory Visit: Payer: Self-pay | Admitting: *Deleted

## 2011-03-24 MED ORDER — CYCLOBENZAPRINE HCL 10 MG PO TABS: 10.0000 mg | ORAL_TABLET | Freq: Three times a day (TID) | ORAL | Status: DC | PRN

## 2011-03-24 MED ORDER — ALPRAZOLAM 0.5 MG PO TABS: 0.5000 mg | ORAL_TABLET | Freq: Three times a day (TID) | ORAL | Status: DC | PRN

## 2011-03-24 MED ORDER — CLONAZEPAM 1 MG PO TABS: 1.0000 mg | ORAL_TABLET | Freq: Every evening | ORAL | Status: DC | PRN

## 2011-03-24 MED ORDER — NEBIVOLOL HCL 5 MG PO TABS: 5.0000 mg | ORAL_TABLET | Freq: Every day | ORAL | Status: DC

## 2011-03-24 MED ORDER — HYDROCODONE-ACETAMINOPHEN 5-500 MG PO TABS: 1.0000 | ORAL_TABLET | Freq: Four times a day (QID) | ORAL | Status: DC | PRN

## 2011-03-24 NOTE — Telephone Encounter (Signed)
Called pt to advise to hold off on taking pain medications as much as possible per MD advice. Left vm to have pt call office. rx sent to pharmacy by e-script For flexerill and bystolic. Printed refills for hydrocodone,klonipin, and xanax for MD to sign. Will manually fax to pharmacy.

## 2011-03-29 ENCOUNTER — Telehealth: Payer: Self-pay

## 2011-03-29 MED ORDER — FENOFIBRATE 160 MG PO TABS: 160.0000 mg | ORAL_TABLET | Freq: Every day | ORAL | Status: DC

## 2011-03-29 NOTE — Telephone Encounter (Signed)
Called pt to instruct to decrease any medications with Tylenol in them until lab work can be drawn.  Pt is aware that fenofibrate will be called in to [facility] on [address].

## 2011-03-31 ENCOUNTER — Telehealth: Payer: Self-pay | Admitting: *Deleted

## 2011-03-31 MED ORDER — CHOLINE FENOFIBRATE 135 MG PO CPDR: 135.0000 mg | DELAYED_RELEASE_CAPSULE | Freq: Every day | ORAL | Status: DC

## 2011-03-31 NOTE — Telephone Encounter (Signed)
Pt must have tried and fail Tricor, Trilipix, or gemsofibrozil in order to get fenofibrate approved..Please advise

## 2011-03-31 NOTE — Telephone Encounter (Signed)
Patient made aware that Rx was changed and she voiced understanding    KP

## 2011-03-31 NOTE — Telephone Encounter (Signed)
trilipix 135 mg  1 po qd #30   2 refills

## 2011-04-13 ENCOUNTER — Ambulatory Visit (INDEPENDENT_AMBULATORY_CARE_PROVIDER_SITE_OTHER): Payer: Medicaid Other | Admitting: Family Medicine

## 2011-04-13 ENCOUNTER — Encounter: Payer: Self-pay | Admitting: Family Medicine

## 2011-04-13 VITALS — BP 135/80 | HR 105 | Temp 98.4°F | Ht 63.0 in | Wt 376.2 lb

## 2011-04-13 DIAGNOSIS — R350 Frequency of micturition: Secondary | ICD-10-CM

## 2011-04-13 LAB — POCT URINALYSIS DIPSTICK
Blood, UA: NEGATIVE
Protein, UA: NEGATIVE
Spec Grav, UA: 1.02
Urobilinogen, UA: 0.2

## 2011-04-13 MED ORDER — NITROFURANTOIN MONOHYD MACRO 100 MG PO CAPS: 100.0000 mg | ORAL_CAPSULE | Freq: Two times a day (BID) | ORAL | Status: AC

## 2011-04-13 NOTE — Progress Notes (Signed)
  Subjective:    Patient ID: [name], female    DOB: [birth year], 40 y.o.   MRN: [id number]  HPI ? UTI- last infxn had normal dip but cx was positive.  sxs started Saturday w/ pelvic pressure, frequency, urgency.  Denies hesitancy.  No hematuria.   Review of Systems For ROS see HPI     Objective:   Physical Exam  Vitals reviewed. Constitutional: She appears well-developed and well-nourished. No distress.  Abdominal: Soft. She exhibits no distension. There is no tenderness (no suprapubic or CVA tenderness).          Assessment & Plan:

## 2011-04-13 NOTE — Patient Instructions (Signed)
We'll culture your urine but start the Macrobid in the mean time Drink plenty of fluids Call and ask what medicine they are concerned about for the gastric bypass- maybe we can work w/ this Happy Holidays!!

## 2011-04-13 NOTE — Assessment & Plan Note (Signed)
Pt's sxs consistent w/ UTI but UA (-).  Given hx of similar infxns w/ negative UA but positive cx will start macrobid.  Reviewed supportive care and red flags that should prompt return.  Pt expressed understanding and is in agreement w/ plan.

## 2011-04-18 ENCOUNTER — Telehealth: Payer: Self-pay | Admitting: *Deleted

## 2011-04-18 ENCOUNTER — Ambulatory Visit: Payer: Medicaid Other | Admitting: Family Medicine

## 2011-04-18 DIAGNOSIS — R319 Hematuria, unspecified: Secondary | ICD-10-CM

## 2011-04-18 MED ORDER — HYDROCODONE-ACETAMINOPHEN 5-500 MG PO TABS: 1.0000 | ORAL_TABLET | Freq: Four times a day (QID) | ORAL | Status: DC | PRN

## 2011-04-18 NOTE — Telephone Encounter (Signed)
Manually [facility]ed #[facility] [address] Sent referal for urology Pt is aware

## 2011-04-26 ENCOUNTER — Ambulatory Visit: Payer: Medicaid Other | Admitting: Family Medicine

## 2011-04-29 ENCOUNTER — Other Ambulatory Visit: Payer: Self-pay | Admitting: Family Medicine

## 2011-04-29 MED ORDER — ZOLPIDEM TARTRATE 10 MG PO TABS: 10.0000 mg | ORAL_TABLET | Freq: Every evening | ORAL | Status: DC | PRN

## 2011-04-29 MED ORDER — FUROSEMIDE 40 MG PO TABS: 40.0000 mg | ORAL_TABLET | Freq: Every day | ORAL | Status: DC

## 2011-04-29 MED ORDER — DULOXETINE HCL 60 MG PO CPEP: 120.0000 mg | ORAL_CAPSULE | Freq: Every day | ORAL | Status: DC

## 2011-04-29 MED ORDER — ALPRAZOLAM 0.5 MG PO TABS: 0.5000 mg | ORAL_TABLET | Freq: Three times a day (TID) | ORAL | Status: DC | PRN

## 2011-04-29 NOTE — Telephone Encounter (Signed)
.  rx faxed to pharmacy, manually for the xanax,and ambein, lasix-cymbalta sent via escribe.called pt to advise xanax PRN, left message for pt to call office

## 2011-04-29 NOTE — Telephone Encounter (Signed)
Ok to refill- please remind her that the Alprazolam is as needed and should not be taken regularly 4x/day.

## 2011-04-29 NOTE — Telephone Encounter (Signed)
Last OV 04-13-11 last refill on xanax 03-24-11 #120 no refills, cymblata 12-08-10, ambein unable to locate last date for this medication

## 2011-05-02 ENCOUNTER — Ambulatory Visit: Payer: Medicaid Other | Admitting: Family Medicine

## 2011-05-04 ENCOUNTER — Telehealth: Payer: Self-pay | Admitting: *Deleted

## 2011-05-04 NOTE — Telephone Encounter (Signed)
Per KC, please call pt and see if her cpap machine is still set on auto or if she is on a fixed pressure.  Also, please find out how she is doing on her cpap machine.    LMOM for pt [name].

## 2011-05-04 NOTE — Telephone Encounter (Signed)
Called and spoke with pt.  Pt states for the past 1 week or so she has been waking up in the middle of the night with a feeling of "choking and can't catch her breath."  Pt then states she is unable to get back to sleep while wearing mask.  Pt states her machine is currently set on auto.  Pt states she felt she did better when her pressure was set at 10cm.  KC, please advise.  Thanks!

## 2011-05-04 NOTE — Telephone Encounter (Signed)
LMOM for James at [facility] to send download on pt's cpap machine.  Awaiting fax.

## 2011-05-04 NOTE — Telephone Encounter (Signed)
We need to download her machine since she has been on auto to get an idea what pressure she needs to be set on.  Will then make the change to the set pressure.   Please get this from dme asap

## 2011-05-05 ENCOUNTER — Other Ambulatory Visit: Payer: Self-pay | Admitting: Pulmonary Disease

## 2011-05-05 ENCOUNTER — Telehealth: Payer: Self-pay | Admitting: *Deleted

## 2011-05-05 DIAGNOSIS — G4733 Obstructive sleep apnea (adult) (pediatric): Secondary | ICD-10-CM

## 2011-05-05 MED ORDER — HYDROCODONE-ACETAMINOPHEN 5-500 MG PO TABS: 1.0000 | ORAL_TABLET | Freq: Four times a day (QID) | ORAL | Status: DC | PRN

## 2011-05-05 NOTE — Telephone Encounter (Signed)
Pt called to say that she is in severe pain per twisting her knee yesterday, notes that she is putting ice on it and propping her leg up, she advised that she knows there is not much that can be done about it but that she is down to 4 hydrocodone pills and was wondering if MD Beverely Low would call her in a rx refill, spoke to MD [name] and was given verbal order/delegated following order to pt, sending #30 tablets of Hydrocodone and to advise that based on the amount of pills she has taken since refill on 04-18-11 she is strongly advised to go to [facility] located on [address], phone number [phone], pt advised that she has MD in that office and will call them. .rx faxed to pharmacy, manually.

## 2011-05-05 NOTE — Telephone Encounter (Signed)
Let pt know that it appears her optimal pressure is 15cm.  Will send an order to dme for this.  Let us know if this is not working well for her.  Also ask her to keep eye on mask leaks.

## 2011-05-05 NOTE — Telephone Encounter (Signed)
Received download from [facility] and put in [facility] very important look at folder.

## 2011-05-05 NOTE — Telephone Encounter (Signed)
lmomtcb x1 

## 2011-05-05 NOTE — Telephone Encounter (Signed)
Agree w/ note as written

## 2011-05-09 ENCOUNTER — Ambulatory Visit: Payer: Self-pay | Admitting: Family Medicine

## 2011-05-09 DIAGNOSIS — Z0289 Encounter for other administrative examinations: Secondary | ICD-10-CM

## 2011-05-24 NOTE — Telephone Encounter (Signed)
Called and spoke with pt.  Pt aware of results and to call us and let us know if this pressure doesn't work well for her and to keep an eye on leaks.  Pt agreed and denied any questions.

## 2011-05-25 ENCOUNTER — Ambulatory Visit (INDEPENDENT_AMBULATORY_CARE_PROVIDER_SITE_OTHER): Payer: Medicaid Other | Admitting: Family Medicine

## 2011-05-25 ENCOUNTER — Encounter: Payer: Self-pay | Admitting: Family Medicine

## 2011-05-25 VITALS — BP 120/75 | HR 100 | Temp 97.7°F | Ht 62.5 in | Wt 362.4 lb

## 2011-05-25 DIAGNOSIS — E669 Obesity, unspecified: Secondary | ICD-10-CM

## 2011-05-25 NOTE — Progress Notes (Signed)
  Subjective:    Patient ID: [name], female    DOB: October 25, 1970, 41 y.o.   MRN: [id number]  HPI Obesity- chronic problem, has lost 14 lbs since last visit.  Has been doing water aerobics.  Taking Raspberry Ketones.  Pain is less so pt is more active.  Still waiting on word from insurance about bariatric surgery.   Review of Systems For ROS see HPI     Objective:   Physical Exam  Vitals reviewed. Constitutional: She is oriented to person, place, and time. She appears well-developed and well-nourished. No distress.       Morbidly obese  HENT:  Head: Normocephalic and atraumatic.  Neck: Normal range of motion. Neck supple.  Cardiovascular: Normal rate, regular rhythm and normal heart sounds.   Pulmonary/Chest: Effort normal and breath sounds normal. No respiratory distress. She has no wheezes. She has no rales.  Musculoskeletal: She exhibits no edema.  Neurological: She is alert and oriented to person, place, and time.  Skin: Skin is warm and dry.  Psychiatric: She has a normal mood and affect. Her behavior is normal.          Assessment & Plan:

## 2011-05-25 NOTE — Patient Instructions (Signed)
Follow up in 1 month to recheck weight loss progress Keep up the good work!!!  I'm so proud of you!!! Call with any questions or concerns Happy New Year!!!

## 2011-05-25 NOTE — Assessment & Plan Note (Signed)
Pt has lost 14 lbs since last visit.  Is exercising more regularly, more aware of diet.  Applauded efforts.  Will continue to follow.

## 2011-05-26 ENCOUNTER — Other Ambulatory Visit: Payer: Self-pay | Admitting: *Deleted

## 2011-05-26 MED ORDER — PROMETHAZINE HCL 25 MG PO TABS: 25.0000 mg | ORAL_TABLET | Freq: Four times a day (QID) | ORAL | Status: DC | PRN

## 2011-05-26 NOTE — Telephone Encounter (Signed)
Pt asked during OV if the phenergran could be sent to pharmacy, sent via escirbe

## 2011-06-06 ENCOUNTER — Other Ambulatory Visit: Payer: Self-pay | Admitting: Family Medicine

## 2011-06-06 MED ORDER — DULOXETINE HCL 60 MG PO CPEP: 120.0000 mg | ORAL_CAPSULE | Freq: Every day | ORAL | Status: DC

## 2011-06-06 NOTE — Telephone Encounter (Signed)
Last OV 05-25-11 last refill 04-29-11 #60 no refills

## 2011-06-06 NOTE — Telephone Encounter (Signed)
Ok for #60 x6

## 2011-06-06 NOTE — Telephone Encounter (Signed)
rx sent to pharmacy by e-script  

## 2011-06-08 ENCOUNTER — Telehealth: Payer: Self-pay | Admitting: *Deleted

## 2011-06-08 NOTE — Telephone Encounter (Signed)
Pt called and left vm advising that she has a migrane and that she has taking the imitrex however no relief noted, called pt back to delegate instructions from MD [name] that she can take another imitrax 2 hours after the first dose, pt understood and will call back if no relief.

## 2011-06-24 ENCOUNTER — Telehealth: Payer: Self-pay | Admitting: Family Medicine

## 2011-06-24 ENCOUNTER — Telehealth: Payer: Self-pay | Admitting: *Deleted

## 2011-06-24 MED ORDER — ROSUVASTATIN CALCIUM 20 MG PO TABS: 10.0000 mg | ORAL_TABLET | Freq: Three times a day (TID) | ORAL | Status: DC | PRN

## 2011-06-24 MED ORDER — HYDROCODONE-ACETAMINOPHEN 5-500 MG PO TABS: 1.0000 | ORAL_TABLET | Freq: Four times a day (QID) | ORAL | Status: DC | PRN

## 2011-06-24 MED ORDER — CYCLOBENZAPRINE HCL 10 MG PO TABS: 10.0000 mg | ORAL_TABLET | Freq: Three times a day (TID) | ORAL | Status: AC | PRN

## 2011-06-24 NOTE — Telephone Encounter (Signed)
Ok for flexeril x3, crestor x6 and percocet #60

## 2011-06-24 NOTE — Telephone Encounter (Signed)
Refill: Hydrocodone /Acetominophen 5-500 tablet. Take 1 to 2 tablets by mouth every 6 hrs as needed for pain. Qty. 30. Last fill 06-04-11

## 2011-06-24 NOTE — Telephone Encounter (Signed)
Received call from [facility] pharmacist Lorin Picket advising they will need a prior authorization for pt Crestor, noted and advised to fax over paperwork for due process, pharm rep understood.

## 2011-06-24 NOTE — Telephone Encounter (Signed)
Refill: Cyclobenzaprine 10mg  tablets. Take 1 tablet by mouth three times daily as needed for muscle spasms. Qty. 90. Last fill 04-28-11.  Refill: Crestor 20mg  tablets. Take 1 tablet by mouth at bedtime. Qty. 30. Last fill 04-30-10

## 2011-06-24 NOTE — Telephone Encounter (Signed)
rx sent to pharmacy by e-script for flexeril and crestor, faxed hydrocodone rx after clarifiaction via MD [name] per noted percocet on prior note, MD [name] clarifed pt to have rx refill for #60 Hydrocodone, noted signed by MD Beverely Low and faxed manually

## 2011-06-24 NOTE — Telephone Encounter (Signed)
Last OV 05-25-11 pt did call this am to advise that her last RX was sent for #30 to assist with her leg however she wanted to make sure we send her normal refill for #60. I advised that I will let MD [name] know.

## 2011-06-27 ENCOUNTER — Ambulatory Visit: Payer: Medicaid Other | Admitting: Family Medicine

## 2011-06-28 ENCOUNTER — Other Ambulatory Visit: Payer: Self-pay | Admitting: *Deleted

## 2011-06-28 ENCOUNTER — Telehealth: Payer: Self-pay | Admitting: Family Medicine

## 2011-06-28 NOTE — Telephone Encounter (Signed)
Opened in error

## 2011-06-28 NOTE — Telephone Encounter (Signed)
Received form for prior authorization concerning pt medication BYSTOLIC 5mg , spoke to [place] whom advised she will send a questionare concerning the medication to be faxed back in. Noted that we will fax in when MD [name] advises. Rep understood

## 2011-06-29 ENCOUNTER — Other Ambulatory Visit: Payer: Self-pay | Admitting: Family Medicine

## 2011-06-29 NOTE — Telephone Encounter (Signed)
The pt called and is requesting a refill of Crestor 20mg  tabs sent to [facility]. (qty 90)   Thanks!

## 2011-07-01 ENCOUNTER — Other Ambulatory Visit: Payer: Self-pay | Admitting: *Deleted

## 2011-07-01 MED ORDER — ROSUVASTATIN CALCIUM 20 MG PO TABS: 20.0000 mg | ORAL_TABLET | Freq: Every day | ORAL | Status: DC

## 2011-07-01 NOTE — Telephone Encounter (Signed)
Pt called to advise she needs a prior authorization for crestor, called help desk number given [phone], spoke to rep [place] and she asked some questions that the MD would need to answer, offered fax number for form to be filled out by MD

## 2011-07-01 NOTE — Telephone Encounter (Signed)
Called pt to advise that I tried to call prior authorization via phone however rep needed some questions to be answered that only MD Beverely Low can answer, called pt to state that MD Beverely Low is out of the office and will have to answer the questions on Monday per the fax finally came to our office, i stated that as soon as MD Beverely Low fills out information that we will fax it back to the company, pt understood, filled out pt information and placed on MD [name] desk for attention on Monday 07-04-11

## 2011-07-01 NOTE — Telephone Encounter (Signed)
Pending prior authorization to be filled out by MD [name] on 07-04-11 pt aware.

## 2011-07-04 NOTE — Telephone Encounter (Signed)
Faxed completed form to number noted, will await approval

## 2011-07-05 NOTE — Telephone Encounter (Signed)
Received dis-approval form per noted pt was taking TID, MD [name] signed clarification to advise pt is only taking one tab daily., faxed form, awaiting approval

## 2011-07-07 ENCOUNTER — Telehealth: Payer: Self-pay | Admitting: Family Medicine

## 2011-07-07 MED ORDER — CLONAZEPAM 1 MG PO TABS: 1.0000 mg | ORAL_TABLET | Freq: Every evening | ORAL | Status: DC | PRN

## 2011-07-07 NOTE — Telephone Encounter (Signed)
Refill: Clonazepam 1mg  tablets. Take 1 tablet by mouth at bedtime as needed for neuropathy and restless leg. Qty 30 Last fill 06-04-11

## 2011-07-07 NOTE — Telephone Encounter (Signed)
Last OV 05-25-11 last refill 03-24-11 #30 with 2 refills

## 2011-07-07 NOTE — Telephone Encounter (Signed)
Patient needs to talk to you about Crestor Rx

## 2011-07-07 NOTE — Telephone Encounter (Signed)
.  rx faxed to pharmacy, manually. For klonopin #30 with 3 refills Called pt to speak about crestor Advised that the crestor had been denied per the insurance company mis-read the form that was sent stating she is taking daily with #90 and insurance company noted that she was taking three times daily based on number sent, MD [name] corrected the mis-understanding, noting the pt takes one crestor pill daily, not TID, currently waiting on prior auth form to come back with approval, pt aware that we will call her with decision. Pt understood.

## 2011-07-07 NOTE — Telephone Encounter (Signed)
Ok for #30, 3 refills 

## 2011-07-08 ENCOUNTER — Telehealth: Payer: Self-pay | Admitting: Family Medicine

## 2011-07-08 NOTE — Telephone Encounter (Signed)
Patient called & stated that you faxed a form for Medicaid for Exemption about 2-1/2 months ago. The recipient Judi Cong not get it can you please call the patient back when you can? Thanks [employer]

## 2011-07-08 NOTE — Telephone Encounter (Signed)
.  left message to have patient return my call. Advised that I did send the form back in via mail, per directions on the form we received.

## 2011-07-12 ENCOUNTER — Ambulatory Visit: Payer: Medicaid Other | Admitting: Family Medicine

## 2011-07-13 ENCOUNTER — Telehealth: Payer: Self-pay | Admitting: *Deleted

## 2011-07-13 NOTE — Telephone Encounter (Signed)
Prior Auth approved from 07-13-11 until 07-12-12,pharmacy notified via fax.

## 2011-07-15 NOTE — Telephone Encounter (Signed)
Noted another staff member has noted approval for the crestor for the pt.

## 2011-07-18 ENCOUNTER — Ambulatory Visit: Payer: Medicaid Other | Admitting: Family Medicine

## 2011-07-19 ENCOUNTER — Telehealth: Payer: Self-pay | Admitting: Family Medicine

## 2011-07-19 NOTE — Telephone Encounter (Signed)
Refill- hydrocodone/acetaminophen 5-500 tab. Take 1-2 tablets by mouth every 6 hours as needed for pain. Qty 60 last fill 2.8.13

## 2011-07-19 NOTE — Telephone Encounter (Signed)
Last OV 05-25-11 last refill 06-24-11 #60 with 0 refills

## 2011-07-19 NOTE — Telephone Encounter (Signed)
Ok for #60 

## 2011-07-20 MED ORDER — HYDROCODONE-ACETAMINOPHEN 5-500 MG PO TABS: 1.0000 | ORAL_TABLET | Freq: Four times a day (QID) | ORAL | Status: DC | PRN

## 2011-07-20 NOTE — Telephone Encounter (Signed)
.  rx faxed to pharmacy, manually.  

## 2011-07-21 NOTE — Telephone Encounter (Signed)
.  left message to have patient return my call.  

## 2011-07-22 NOTE — Telephone Encounter (Signed)
Spoke with patient, patient states this process is complete. She spoke with representative from [facility] and everything is ok. Patient will call if needed

## 2011-07-26 ENCOUNTER — Ambulatory Visit: Payer: Medicaid Other | Admitting: Family Medicine

## 2011-08-08 ENCOUNTER — Telehealth: Payer: Self-pay | Admitting: *Deleted

## 2011-08-08 ENCOUNTER — Ambulatory Visit: Payer: Medicaid Other | Admitting: Family Medicine

## 2011-08-08 NOTE — Telephone Encounter (Signed)
Pt left vm stating that the weather is really bad where she lives and not sure if she will be able to come in for her apt and she did not want to get a 50 dollar fee for canceling her apt, pt advised she could not remember when her apt was, this nurse called pt and left vm to state that her apt is at 11am and that she can call us back to verify if she is coming in or not.

## 2011-08-10 ENCOUNTER — Ambulatory Visit (INDEPENDENT_AMBULATORY_CARE_PROVIDER_SITE_OTHER): Payer: Medicaid Other | Admitting: Family Medicine

## 2011-08-10 ENCOUNTER — Encounter: Payer: Self-pay | Admitting: Family Medicine

## 2011-08-10 ENCOUNTER — Other Ambulatory Visit: Payer: Self-pay | Admitting: Family Medicine

## 2011-08-10 VITALS — BP 118/80 | HR 119 | Temp 97.6°F | Ht 62.5 in | Wt 374.2 lb

## 2011-08-10 DIAGNOSIS — E669 Obesity, unspecified: Secondary | ICD-10-CM

## 2011-08-10 NOTE — Telephone Encounter (Signed)
Refill for  Hydrocodone/acetaminophen 5-500 TB Qty 60 Take 1 to 2 tablets by mouth every 6 hours as needed for pain Last filled 3.06.13

## 2011-08-10 NOTE — Progress Notes (Signed)
  Subjective:    Patient ID: [name], female    DOB: November 13, 1970, 41 y.o.   MRN: [id number]  HPI Obesity- has gained 12 lbs since last visit.  Pt is very frustrated by this.  Has not heard from [facility] regarding status of bariatric surgery.  This is also frustrating.  Was told they would not do bypass due to certain meds she takes but did not tell her which meds.  Is willing to stop/change any meds b/c she wants to have surgery- aware that her obesity is life threatening and wants to proceed.   Review of Systems For ROS see HPI     Objective:   Physical Exam  Vitals reviewed. Constitutional: She is oriented to person, place, and time. She appears well-developed and well-nourished. No distress.       Morbidly obese  HENT:  Head: Normocephalic and atraumatic.  Eyes: Conjunctivae and EOM are normal. Pupils are equal, round, and reactive to light.  Neck: Normal range of motion. Neck supple. No thyromegaly present.  Cardiovascular: Normal rate, regular rhythm, normal heart sounds and intact distal pulses.   No murmur heard. Pulmonary/Chest: Effort normal and breath sounds normal. No respiratory distress.  Abdominal: Soft. She exhibits no distension. There is no tenderness.  Musculoskeletal: She exhibits no edema.  Lymphadenopathy:    She has no cervical adenopathy.  Neurological: She is alert and oriented to person, place, and time.  Skin: Skin is warm and dry.  Psychiatric: She has a normal mood and affect. Her behavior is normal.          Assessment & Plan:

## 2011-08-10 NOTE — Patient Instructions (Signed)
Schedule your complete physical for May Give [place] the # to [facility] and we'll call and help if we can Call with any questions or concerns Hang in there!!!

## 2011-08-11 MED ORDER — HYDROCODONE-ACETAMINOPHEN 5-500 MG PO TABS: 1.0000 | ORAL_TABLET | Freq: Four times a day (QID) | ORAL | Status: DC | PRN

## 2011-08-11 NOTE — Telephone Encounter (Signed)
Ok for #60 

## 2011-08-11 NOTE — Telephone Encounter (Signed)
.  rx faxed to pharmacy, manually.  

## 2011-08-11 NOTE — Telephone Encounter (Signed)
Last OV 08-10-11 last refill 07-20-11 #60 no refills

## 2011-08-14 NOTE — Assessment & Plan Note (Signed)
Deteriorated.  Pt again has gained weight.  Pt's obesity is life threatening at this point and she continues to weight for decision on bariatric surgery.  Pt to call w/ contact info for [facility] so we can call on her behalf and try and determine what is holding up procedure.  Encouraged pt to resume physical activity and watch diet.  Will continue to follow.

## 2011-08-22 ENCOUNTER — Telehealth: Payer: Self-pay | Admitting: Family Medicine

## 2011-08-22 NOTE — Telephone Encounter (Signed)
Last OV 08-10-11 last refill for xanax 0.5mg  04-29-11 #120 no refills

## 2011-08-22 NOTE — Telephone Encounter (Signed)
Pt left vm stating that she needs her last 2 years of weightloss office visits faxed to [facility] to [place] per her surgery fax number [phone], advised that fax will be sent today, pt understood.printed OV for last 2 years and faxed to number provided by pt

## 2011-08-22 NOTE — Telephone Encounter (Signed)
Ok for #120, no refills- should try and decrease frequency of use

## 2011-08-22 NOTE — Telephone Encounter (Signed)
Refill-alprazolam 0.5mg  tablets. Take one tablet by mouth three times daily as needed for anxiety and sleep. Qty 120 last fill 3.4.13

## 2011-08-23 MED ORDER — ALPRAZOLAM 0.5 MG PO TABS: 0.5000 mg | ORAL_TABLET | Freq: Three times a day (TID) | ORAL | Status: DC | PRN

## 2011-08-23 NOTE — Telephone Encounter (Addendum)
.  rx faxed to pharmacy, manually. Called pt to advise she should try to decrease the frequency of use, pt understood instructions and will try to decrease the usage

## 2011-08-23 NOTE — Telephone Encounter (Signed)
Addended by: Derry Lory A on: 08/23/2011 09:59 AM   Modules accepted: Orders

## 2011-09-05 ENCOUNTER — Telehealth: Payer: Self-pay | Admitting: Family Medicine

## 2011-09-05 MED ORDER — PROMETHAZINE HCL 25 MG PO TABS: 25.0000 mg | ORAL_TABLET | Freq: Four times a day (QID) | ORAL | Status: DC | PRN

## 2011-09-05 MED ORDER — ZOLPIDEM TARTRATE 10 MG PO TABS: 10.0000 mg | ORAL_TABLET | Freq: Every evening | ORAL | Status: DC | PRN

## 2011-09-05 NOTE — Telephone Encounter (Signed)
Last OV 08-10-11 last refill 04-29-11 #30 no refills

## 2011-09-05 NOTE — Telephone Encounter (Signed)
Refill: Zolpidem 10mg  tablets #30. Take 1 tablet by mouth every night at bedtime as needed. Last fill 04-29-11  Promethazine 25mg  tablets #30. Take 1 tablet by mouth every 6 hours as needed for nausea. Last fill 07-18-11

## 2011-09-05 NOTE — Telephone Encounter (Signed)
Ok for #30 of each 

## 2011-09-05 NOTE — Telephone Encounter (Signed)
rx sent to pharmacy by e-script for phenergran and faxed the ambien rx to pharmacy manually

## 2011-09-08 ENCOUNTER — Telehealth: Payer: Self-pay | Admitting: *Deleted

## 2011-09-08 NOTE — Telephone Encounter (Signed)
Pt left vm stating that [facility] has not received her office visits from the last 2 years per Obesity, noted paperwork has been faxed in bulk two times already, to attention of [place] [phone], contacted [facility] and spoke with Winfield Rast and advised that we are also on EPIC system and that she can access the office notes via epic, Marguerita advised that she still does not see the paperwork and nor does she note the pt in her Epic system by MRN nor pt DOB or name, rep from [facility] asked for [place] to send via mail, clarified address [facility] Attention Winfield Rast [id number] [facility] [address] [place] [address], left vm for pt to note that the office notes have been mailed to the address.

## 2011-09-14 ENCOUNTER — Telehealth: Payer: Self-pay | Admitting: Family Medicine

## 2011-09-14 MED ORDER — HYDROCODONE-ACETAMINOPHEN 5-500 MG PO TABS: 1.0000 | ORAL_TABLET | Freq: Four times a day (QID) | ORAL | Status: DC | PRN

## 2011-09-14 NOTE — Telephone Encounter (Signed)
Ok for #60, 1 refill 

## 2011-09-14 NOTE — Telephone Encounter (Signed)
Refill: Hydrocodone/acetaminophen 5-500mg . Take 1 to 2 tablets by mouth every 6 hours as needed for pain. Qty 60. Last fill 3.28.13

## 2011-09-14 NOTE — Telephone Encounter (Signed)
.  rx faxed to pharmacy, manually.  

## 2011-09-14 NOTE — Telephone Encounter (Signed)
Last OV 08-10-11, last filled 08-11-11 #60

## 2011-10-18 ENCOUNTER — Other Ambulatory Visit: Payer: Self-pay | Admitting: Family Medicine

## 2011-10-18 MED ORDER — SUMATRIPTAN SUCCINATE 100 MG PO TABS: 100.0000 mg | ORAL_TABLET | ORAL | Status: DC | PRN

## 2011-10-18 MED ORDER — ALPRAZOLAM 0.5 MG PO TABS: 0.5000 mg | ORAL_TABLET | Freq: Three times a day (TID) | ORAL | Status: DC | PRN

## 2011-10-18 NOTE — Telephone Encounter (Signed)
Refill Sumatriptan 100MG  Tablets Take one tablet by mouth as needed for migraine  Qty 15 Last fill 4.22.13 Last ov 3.27.13

## 2011-10-18 NOTE — Telephone Encounter (Signed)
Ok for Sumatriptan #15, 3 refills Ok for Alprazolam 120, no refills

## 2011-10-18 NOTE — Telephone Encounter (Signed)
.  rx faxed to pharmacy, manually for alprazolam imitrex noted escribed

## 2011-10-18 NOTE — Telephone Encounter (Signed)
Also sent in a request for alprazolam 0.5mg  tablets Take one tablet by mouth every day  Last wrt. 4.9.13

## 2011-10-18 NOTE — Telephone Encounter (Signed)
Last OV 08-10-11 last refill 08-23-11 #120 with 0 refills

## 2011-10-19 ENCOUNTER — Telehealth: Payer: Self-pay | Admitting: *Deleted

## 2011-10-19 ENCOUNTER — Ambulatory Visit (INDEPENDENT_AMBULATORY_CARE_PROVIDER_SITE_OTHER): Payer: Medicaid Other | Admitting: Family Medicine

## 2011-10-19 ENCOUNTER — Encounter: Payer: Self-pay | Admitting: Family Medicine

## 2011-10-19 VITALS — BP 125/81 | HR 100 | Temp 97.6°F | Ht 62.25 in | Wt 363.0 lb

## 2011-10-19 DIAGNOSIS — J029 Acute pharyngitis, unspecified: Secondary | ICD-10-CM

## 2011-10-19 DIAGNOSIS — E669 Obesity, unspecified: Secondary | ICD-10-CM

## 2011-10-19 DIAGNOSIS — R590 Localized enlarged lymph nodes: Secondary | ICD-10-CM | POA: Insufficient documentation

## 2011-10-19 DIAGNOSIS — R599 Enlarged lymph nodes, unspecified: Secondary | ICD-10-CM

## 2011-10-19 LAB — CBC WITH DIFFERENTIAL/PLATELET
Basophils Absolute: 0.1 10*3/uL (ref 0.0–0.1)
Eosinophils Absolute: 0.4 10*3/uL (ref 0.0–0.7)
HCT: 45 % (ref 36.0–46.0)
Hemoglobin: 14.7 g/dL (ref 12.0–15.0)
Lymphs Abs: 2.5 10*3/uL (ref 0.7–4.0)
MCHC: 32.6 g/dL (ref 30.0–36.0)
Monocytes Absolute: 0.6 10*3/uL (ref 0.1–1.0)
Monocytes Relative: 6.2 % (ref 3.0–12.0)
Neutro Abs: 6.7 10*3/uL (ref 1.4–7.7)
Platelets: 305 10*3/uL (ref 150.0–400.0)
RDW: 14.6 % (ref 11.5–14.6)

## 2011-10-19 MED ORDER — SUMATRIPTAN SUCCINATE 100 MG PO TABS: 100.0000 mg | ORAL_TABLET | ORAL | Status: DC | PRN

## 2011-10-19 NOTE — Assessment & Plan Note (Signed)
New to provider.  Pt discovered this while reviewing her d/c paperwork from [facility] in November.  Unclear as to significance of this but pt w/ hx of GYN cancer.  Will check CBC and refer to heme.  Pt expressed understanding and is in agreement w/ plan.

## 2011-10-19 NOTE — Telephone Encounter (Signed)
Per insurance company Pt is only allowed #12 per month unless she is having more that 6 headache a month.  If Pt is having more than 6 HA a month PA will need to be initiated. Left message to call office to clarify if Pt is having more then 6 HA a month.

## 2011-10-19 NOTE — Patient Instructions (Signed)
We'll call you with your hematology appt We'll notify you of your lab results Try and keep the CPAP mask on to avoid sore throat Call with any questions or concerns Keep up the good work!  You look great!!!

## 2011-10-19 NOTE — Assessment & Plan Note (Signed)
New.  Pt's sxs only occur when CPAP mask comes off at night due to mouth breathing and dry air.  Asymptomatic today.  Reviewed importance of compliance w/ CPAP mask.  No signs of infxn on PE.  Pt to call if sxs don't improve.

## 2011-10-19 NOTE — Progress Notes (Signed)
  Subjective:    Patient ID: [name], female    DOB: [birth year], 41 y.o.   MRN: [id number]  HPI Sore throat- occuring frequently over the last few months.  Pt notes that sxs are worse on the nights she takes off her CPAP mask.  Painful to swallow- improves as the day goes on.  sxs are worst first thing in the AM.  No pain today.  No fevers.  Prevertebral LAD- found on CT done at [facility] on 04/15/11.  Was recommended to f/u w/ Dr Cathi Roan in [place] (heme-onc).  Pt was unaware of this dx.  Obesity- chronic problem.  Has lost 11 lbs since last visit.  Goal is to lose 20 lbs and then [facility] will proceed w/ bariatric surgery.  Pt has f/u appt pending w/ nutritionist.  She is very excited about her progress.   Review of Systems For ROS see HPI     Objective:   Physical Exam  Vitals reviewed. Constitutional: She is oriented to person, place, and time. She appears well-developed and well-nourished. No distress.       obese  HENT:  Head: Normocephalic and atraumatic.  Nose: Nose normal.       TMs normal bilaterally No TTP over sinuses No tonsillar enlargement, erythema, or exudate  Neck: Normal range of motion. Neck supple.  Cardiovascular: Normal rate, regular rhythm and normal heart sounds.   Pulmonary/Chest: Effort normal and breath sounds normal. No respiratory distress. She has no wheezes. She has no rales.  Lymphadenopathy:    She has cervical adenopathy.  Neurological: She is alert and oriented to person, place, and time.  Skin: Skin is warm and dry.  Psychiatric: She has a normal mood and affect. Her behavior is normal. Judgment and thought content normal.          Assessment & Plan:

## 2011-10-19 NOTE — Telephone Encounter (Signed)
Pt came into the office, per Selena Batten Pt indicated that she is not having more than 6 headache a months. Rx resent to pharmacy with new Disp #.

## 2011-10-19 NOTE — Assessment & Plan Note (Signed)
Chronic problem.  Has lost 11 lbs since last visit.  Needs to lose 20 lbs prior to [facility] proceeding w/ bariatric surgery.  Has appt upcoming w/ nutrition.  Pt is very excited about her progress.  Applauded her efforts.  Will continue to follow.

## 2011-10-20 ENCOUNTER — Encounter: Payer: Self-pay | Admitting: *Deleted

## 2011-10-26 ENCOUNTER — Other Ambulatory Visit: Payer: Self-pay | Admitting: *Deleted

## 2011-10-26 MED ORDER — PROMETHAZINE HCL 25 MG PO TABS: 25.0000 mg | ORAL_TABLET | Freq: Four times a day (QID) | ORAL | Status: DC | PRN

## 2011-10-26 NOTE — Telephone Encounter (Signed)
Called pt to advise her phenegran RX has been sent to the pharmacy per noted request while in office for OV last week, pt understood.

## 2011-10-31 ENCOUNTER — Emergency Department (HOSPITAL_COMMUNITY)
Admission: EM | Admit: 2011-10-31 | Discharge: 2011-11-01 | Disposition: A | Discharge status: Home / Self Care | Payer: Medicaid Other | Attending: Emergency Medicine | Admitting: Emergency Medicine

## 2011-10-31 ENCOUNTER — Encounter (HOSPITAL_COMMUNITY): Payer: Self-pay | Admitting: *Deleted

## 2011-10-31 ENCOUNTER — Other Ambulatory Visit: Payer: Self-pay | Admitting: Family Medicine

## 2011-10-31 DIAGNOSIS — G4730 Sleep apnea, unspecified: Secondary | ICD-10-CM | POA: Insufficient documentation

## 2011-10-31 DIAGNOSIS — E785 Hyperlipidemia, unspecified: Secondary | ICD-10-CM | POA: Insufficient documentation

## 2011-10-31 DIAGNOSIS — I10 Essential (primary) hypertension: Secondary | ICD-10-CM | POA: Insufficient documentation

## 2011-10-31 DIAGNOSIS — M545 Low back pain, unspecified: Secondary | ICD-10-CM | POA: Insufficient documentation

## 2011-10-31 DIAGNOSIS — E669 Obesity, unspecified: Secondary | ICD-10-CM | POA: Insufficient documentation

## 2011-10-31 DIAGNOSIS — IMO0001 Reserved for inherently not codable concepts without codable children: Secondary | ICD-10-CM | POA: Insufficient documentation

## 2011-10-31 DIAGNOSIS — Z79899 Other long term (current) drug therapy: Secondary | ICD-10-CM | POA: Insufficient documentation

## 2011-10-31 MED ORDER — CYCLOBENZAPRINE HCL 10 MG PO TABS: 10.0000 mg | ORAL_TABLET | Freq: Three times a day (TID) | ORAL | Status: DC | PRN

## 2011-10-31 NOTE — Telephone Encounter (Signed)
Left vm with MD [name] instructions as follows:  Unfortunately i don't have any suggestions b/c she is already on narcotics and muscle relaxers. May need hospital admit for pain control if it's this severe.   Left office number with personal extension.

## 2011-10-31 NOTE — Telephone Encounter (Signed)
Pt left vm advising that she has been down with her back for 3 days, notes in lower back right above her buttock and runs into her neck, notes a knot on her neck that is small, notes feels like a spasm in the knot, taking flexeril with a little help, denies urinary issues, can only go to the bathroom and back without feeling pain, can not recall anything she did differently or any reason the pain came on, offered 2pm or 2:15pm apt for today, pt wants me to talk with MD [name] first to see if she wants her to come in here first or go have an x-ray first, advised we can send her to [facility] for x-ray after apt with MD, pt still declined to see what MD advises

## 2011-10-31 NOTE — Telephone Encounter (Signed)
If pt is not having relief w/ her usual pain meds and flexeril- she needs ER or UC eval to determine what happened.

## 2011-10-31 NOTE — Telephone Encounter (Signed)
Refill cyclobenzaprine 10mg  tablet, #90, take one tablet by mouth three times daily as needed for muscle spasms, last fill 3.26.13 Last ov 6.5.13

## 2011-10-31 NOTE — ED Notes (Addendum)
Pt reports lower back pain x3 days - pt w/ hx of DDD and was treated at [facility] today for same however pain has returned and pts orthopedic physician can not see pt until tomorrow.

## 2011-10-31 NOTE — Telephone Encounter (Signed)
Called pt to advise, pt notes that last ER visit she had to wait an awful long time, advised that UC on [address] usual has fair wait time, pt notes that she will call there to see if they accept her insurance, sent medication via escribe per last refill 07-14-11 #90 with 1 refill

## 2011-10-31 NOTE — Telephone Encounter (Signed)
Pt called back left vm to advise that UC woyuld not accept her insurance, went to [facility] ER and was advised as disc disease, noted no x-rays were taken was given shot of phenergan and and demerol and given RX for Vicodin, pt know noting after 2 hours she is now experiancing pain that has her in tears, any suggestions

## 2011-11-01 MED ORDER — HYDROMORPHONE HCL PF 2 MG/ML IJ SOLN
2.0000 mg | Freq: Once | INTRAMUSCULAR | Status: AC
  Administered 2011-11-01: 2 mg via INTRAMUSCULAR
  Filled 2011-11-01: qty 1

## 2011-11-01 MED ORDER — ONDANSETRON 4 MG PO TBDP
4.0000 mg | ORAL_TABLET | Freq: Once | ORAL | Status: AC
  Administered 2011-11-01: 4 mg via ORAL
  Filled 2011-11-01: qty 1

## 2011-11-01 MED ORDER — HYDROMORPHONE HCL PF 2 MG/ML IJ SOLN: 2.0000 mg | Freq: Once | INTRAMUSCULAR | Status: DC

## 2011-11-01 NOTE — Discharge Instructions (Signed)
Back Pain, Adult Low back pain is very common. About 1 in 5 people have back pain.The cause of low back pain is rarely dangerous. The pain often gets better over time.About half of people with a sudden onset of back pain feel better in just 2 weeks. About 8 in 10 people feel better by 6 weeks.  CAUSES Some common causes of back pain include:  Strain of the muscles or ligaments supporting the spine.   Wear and tear (degeneration) of the spinal discs.   Arthritis.   Direct injury to the back.  DIAGNOSIS Most of the time, the direct cause of low back pain is not known.However, back pain can be treated effectively even when the exact cause of the pain is unknown.Answering your caregiver's questions about your overall health and symptoms is one of the most accurate ways to make sure the cause of your pain is not dangerous. If your caregiver needs more information, he or she may order lab work or imaging tests (X-rays or MRIs).However, even if imaging tests show changes in your back, this usually does not require surgery. HOME CARE INSTRUCTIONS For many people, back pain returns.Since low back pain is rarely dangerous, it is often a condition that people can learn to manageon their own.   Remain active. It is stressful on the back to sit or stand in one place. Do not sit, drive, or stand in one place for more than 30 minutes at a time. Take short walks on level surfaces as soon as pain allows.Try to increase the length of time you walk each day.   Do not stay in bed.Resting more than 1 or 2 days can delay your recovery.   Do not avoid exercise or work.Your body is made to move.It is not dangerous to be active, even though your back may hurt.Your back will likely heal faster if you return to being active before your pain is gone.   Pay attention to your body when you bend and lift. Many people have less discomfortwhen lifting if they bend their knees, keep the load close to their  bodies,and avoid twisting. Often, the most comfortable positions are those that put less stress on your recovering back.   Find a comfortable position to sleep. Use a firm mattress and lie on your side with your knees slightly bent. If you lie on your back, put a pillow under your knees.   Only take over-the-counter or prescription medicines as directed by your caregiver. Over-the-counter medicines to reduce pain and inflammation are often the most helpful.Your caregiver may prescribe muscle relaxant drugs.These medicines help dull your pain so you can more quickly return to your normal activities and healthy exercise.   Put ice on the injured area.   Put ice in a plastic bag.   Place a towel between your skin and the bag.   Leave the ice on for 15 to 20 minutes, 3 to 4 times a day for the first 2 to 3 days. After that, ice and heat may be alternated to reduce pain and spasms.   Ask your caregiver about trying back exercises and gentle massage. This may be of some benefit.   Avoid feeling anxious or stressed.Stress increases muscle tension and can worsen back pain.It is important to recognize when you are anxious or stressed and learn ways to manage it.Exercise is a great option.  SEEK MEDICAL CARE IF:  You have pain that is not relieved with rest or medicine.   You have   pain that does not improve in 1 week.   You have new symptoms.   You are generally not feeling well.  SEEK IMMEDIATE MEDICAL CARE IF:   You have pain that radiates from your back into your legs.   You develop new bowel or bladder control problems.   You have unusual weakness or numbness in your arms or legs.   You develop nausea or vomiting.   You develop abdominal pain.   You feel faint.  Document Released: 05/02/2005 Document Revised: 04/21/2011 Document Reviewed: 09/20/2010 Exit[employer] Patient Information 2012 [employer]. 

## 2011-11-01 NOTE — ED Provider Notes (Signed)
Medical screening examination/treatment/procedure(s) were performed by non-physician practitioner and as supervising physician I was immediately available for consultation/collaboration.   Willette Mudry L Nicholaos Schippers, MD 11/01/11 0826 

## 2011-11-01 NOTE — ED Provider Notes (Signed)
History     CSN: [id number]  Arrival date & time 10/31/11  2017   First MD Initiated Contact with Patient 11/01/11 0244      Chief Complaint  Patient presents with  . Back Pain    (Consider location/radiation/quality/duration/timing/severity/associated sxs/prior treatment) HPI  She presents to the emergency department with complaints of low back pain for 3 days. She has a history of degenerative disc disease. The patient was supposed to see Dr. Beverely Low. today but was told to come to the emergency department for pain management first according to the patient. Patient denies any new symptoms of her back pain and states that she was at the [facility] and was carrying large bottle of soap and laundry detergent started her pain exacerbation. She states that she takes Vicodin and Flexeril at home. She denies any numbness or tingling in her legs, she is ambulatory, she denies bowel or urinary incontinence. She states that her pain is I. lateral paraspinal. She denies fever, nausea, weakness. She is in no acute distress vital signs are stable.  Past Medical History  Diagnosis Date  . Obesity   . Hyperlipidemia   . Fibromyalgia   . Hypertension   . Anxiety   . Carpal tunnel syndrome   . Ankle fracture, right   . Cervical cancer 1998  . Allergic rhinitis   . Sleep apnea   . Migraine headache     Past Surgical History  Procedure Date  . Cholecystectomy 07/1992  . Abdominal hysterectomy 10/1996  . Carpal tunnel release     bilateral   . Cesarean section     Family History  Problem Relation Age of Onset  . Diabetes Mother   . Emphysema Mother   . Allergies Mother   . Hypertension Mother   . Heart disease Father   . Allergies Father   . Prostate cancer Father   . Breast cancer Maternal Grandmother     History  Substance Use Topics  . Smoking status: Never Smoker   . Smokeless tobacco: Never Used  . Alcohol Use: No    OB History    Grav Para Term Preterm Abortions TAB  SAB Ect Mult Living                  Review of Systems   HEENT: denies blurry vision or change in hearing PULMONARY: Denies difficulty breathing and SOB CARDIAC: denies chest pain or heart palpitations MUSCULOSKELETAL:  denies being unable to ambulate ABDOMEN AL: denies abdominal pain GU: denies loss of bowel or urinary control NEURO: denies numbness and tingling in extremities SKIN: no new rashes PSYCH: patient behavior is normal NECK: Not complaining of neck pain     Allergies  Aspirin; Bactrim; Cephalexin; Clindamycin/lincomycin; Doxycycline; Lisinopril; Naproxen; Niacin; Sulfamethoxazole w-trimethoprim; Benzoin compound; Pnu-imune; and Sulfonamide derivatives  Home Medications   Current Outpatient Rx  Name Route Sig Dispense Refill  . ALPRAZOLAM 0.5 MG PO TABS Oral Take 1 tablet (0.5 mg total) by mouth 3 (three) times daily as needed for sleep or anxiety. 120 tablet 0  . CLONAZEPAM 1 MG PO TABS Oral Take 1 tablet (1 mg total) by mouth at bedtime as needed (for neuropathy/restless leg). 30 tablet 3  . CYCLOBENZAPRINE HCL 10 MG PO TABS Oral Take 1 tablet (10 mg total) by mouth 3 (three) times daily as needed for muscle spasms. 90 tablet 1  . DULOXETINE HCL 60 MG PO CPEP Oral Take 2 capsules (120 mg total) by mouth daily. 2 po  qAM 60 capsule 6  . FENOFIBRATE 160 MG PO TABS Oral Take 1 tablet (160 mg total) by mouth daily. 30 tablet 3  . FUROSEMIDE 40 MG PO TABS Oral Take 1 tablet (40 mg total) by mouth daily. 30 tablet 3  . GABAPENTIN 600 MG PO TABS Oral Take 1 tablet (600 mg total) by mouth 3 (three) times daily. 90 tablet 3  . HYDROCODONE-ACETAMINOPHEN 5-500 MG PO TABS Oral Take 1-2 tablets by mouth every 6 (six) hours as needed. For pain 60 tablet 1  . NEBIVOLOL HCL 5 MG PO TABS Oral Take 1 tablet (5 mg total) by mouth daily. 30 tablet 6  . PROMETHAZINE HCL 25 MG PO TABS Oral Take 1 tablet (25 mg total) by mouth every 6 (six) hours as needed. For nausea 30 tablet 0  .  ROSUVASTATIN CALCIUM 20 MG PO TABS Oral Take 1 tablet (20 mg total) by mouth daily. 90 tablet 3  . SUMATRIPTAN SUCCINATE 100 MG PO TABS Oral Take 1 tablet (100 mg total) by mouth every 2 (two) hours as needed. For migraines 12 tablet 3  . TRILIPIX 135 MG PO CPDR  TAKE 1 CAPSULE BY MOUTH DAILY 30 capsule 1  . VALACYCLOVIR HCL 1 G PO TABS Oral Take 2,000 mg by mouth every 12 (twelve) hours as needed.      Marland Kitchen ZOLPIDEM TARTRATE 10 MG PO TABS Oral Take 1 tablet (10 mg total) by mouth at bedtime as needed. 30 tablet 0    BP 125/88  Pulse 93  Temp 97.9 F (36.6 C) (Oral)  Resp 20  SpO2 97%  Physical Exam  Nursing note and vitals reviewed. Constitutional: She appears well-developed and well-nourished. No distress.  HENT:  Head: Normocephalic and atraumatic.  Eyes: Pupils are equal, round, and reactive to light.  Neck: Normal range of motion. Neck supple.  Cardiovascular: Normal rate and regular rhythm.   Pulmonary/Chest: Effort normal.  Abdominal: Soft.  Musculoskeletal:        Equal strength to bilateral lower extremities. Neurosensory  function adequate to both legs. Skin color is normal. Skin is warm and moist. I see no step off deformity, no bony tenderness. Pt is able to ambulate without limp. Pain is relieved when sitting in certain positions. ROM is decreased due to pain. No crepitus, laceration, effusion, swelling.  Pulses are normal   Neurological: She is alert.  Skin: Skin is warm and dry.    ED Course  Procedures (including critical care time)  Labs Reviewed - No data to display No results found.   1. Low back pain       MDM  Patient given 2mg  IM Dilaudid in ED for pain management. She has an appointment to see Dr. Beverely Low today 11/01/2011. I have advised that she follow up and make it to that appointment.  Patient with back pain. No neurological deficits. Patient is ambulatory. No warning symptoms of back pain including: loss of bowel or bladder control, night sweats,  waking from sleep with back pain, unexplained fevers or weight loss, h/o cancer, IVDU, recent trauma. No concern for cauda equina, epidural abscess, or other serious cause of back pain. Conservative measures such as rest, ice/heat and pain medicine indicated with PCP follow-up if no improvement with conservative management.           Dorthula Matas, PA 11/01/11 [id number]

## 2011-11-02 ENCOUNTER — Telehealth: Payer: Self-pay | Admitting: Hematology & Oncology

## 2011-11-03 ENCOUNTER — Ambulatory Visit: Payer: Medicaid Other

## 2011-11-03 ENCOUNTER — Ambulatory Visit (HOSPITAL_BASED_OUTPATIENT_CLINIC_OR_DEPARTMENT_OTHER): Payer: Medicaid Other | Admitting: Hematology & Oncology

## 2011-11-03 ENCOUNTER — Other Ambulatory Visit (HOSPITAL_BASED_OUTPATIENT_CLINIC_OR_DEPARTMENT_OTHER): Payer: Medicaid Other | Admitting: Lab

## 2011-11-03 VITALS — BP 119/83 | HR 84 | Temp 97.0°F | Ht 62.0 in | Wt 371.0 lb

## 2011-11-03 DIAGNOSIS — R599 Enlarged lymph nodes, unspecified: Secondary | ICD-10-CM

## 2011-11-03 DIAGNOSIS — Z8543 Personal history of malignant neoplasm of ovary: Secondary | ICD-10-CM

## 2011-11-03 DIAGNOSIS — R590 Localized enlarged lymph nodes: Secondary | ICD-10-CM

## 2011-11-03 LAB — COMPREHENSIVE METABOLIC PANEL
ALT: 22 U/L (ref 0–35)
AST: 21 U/L (ref 0–37)
CO2: 25 mEq/L (ref 19–32)
Calcium: 9.4 mg/dL (ref 8.4–10.5)
Chloride: 102 mEq/L (ref 96–112)
Creatinine, Ser: 0.84 mg/dL (ref 0.50–1.10)
Sodium: 139 mEq/L (ref 135–145)
Total Protein: 6.8 g/dL (ref 6.0–8.3)

## 2011-11-03 LAB — CBC WITH DIFFERENTIAL (CANCER CENTER ONLY)
BASO#: 0 10*3/uL (ref 0.0–0.2)
Eosinophils Absolute: 0.4 10*3/uL (ref 0.0–0.5)
HGB: 13.9 g/dL (ref 11.6–15.9)
LYMPH#: 2.8 10*3/uL (ref 0.9–3.3)
MONO#: 0.7 10*3/uL (ref 0.1–0.9)
MONO%: 6.9 % (ref 0.0–13.0)
NEUT#: 5.8 10*3/uL (ref 1.5–6.5)
Platelets: 271 10*3/uL (ref 145–400)
RBC: 4.83 10*6/uL (ref 3.70–5.32)
WBC: 9.7 10*3/uL (ref 3.9–10.0)

## 2011-11-03 LAB — SEDIMENTATION RATE: Sed Rate: 27 mm/hr — ABNORMAL HIGH (ref 0–22)

## 2011-11-03 LAB — LACTATE DEHYDROGENASE: LDH: 148 U/L (ref 94–250)

## 2011-11-03 LAB — CHCC SATELLITE - SMEAR

## 2011-11-03 NOTE — Progress Notes (Signed)
This office note has been dictated.

## 2011-11-03 NOTE — Telephone Encounter (Signed)
Opened in error

## 2011-11-04 NOTE — Progress Notes (Signed)
CC:   Neena Rhymes, M.D.  DIAGNOSIS:  Nonspecific retroperitoneal lymphadenopathy.  HISTORY OF PRESENT ILLNESS:  [name] is a very nice 41 year old white female.  She is morbidly obese.  She is looking forward to having gastric bypass.  This is going to be done out in [place].  She sees Dr. Beverely Low.  Apparently, she was seen in the emergency room up at [facility].  She was felt to have problems with kidney stones.  She had a CT scan.  Unfortunately, I do not have any report of the CT scan. This apparently showed a "large lymph node" in the prevertebral region. Again, I do not have  any formal reports from the CT scan.  She has a remote history of cervical cancer.  She had a hysterectomy back in 1998.  Dr. Beverely Low felt that an oncologic evaluation was indicated.  Dr. Beverely Low kindly referred [name] to the [employer] for evaluation.  [name] is feeling okay.  She is not having any abdominal pain.  She is having no back pain.  She does not have any hematuria.  There is no change in bowel or bladder habits.  There has been no change in leg swelling.  She has chronic leg swelling from the obesity.  She has had no nausea or vomiting.  She has had no weight loss.  She has had no rashes.  She has not noted any fevers or sweats.  She has had no dysphagia or odynophagia.  She does have sleep apnea.  Does wear CPAP at night.  This is not been an issue for her.  She has not noted any swollen lymph glands on her neck or under arms.  PAST MEDICAL HISTORY: 1. Morbid obesity. 2. Early stage cervical cancer, status post hysterectomy in 1998. 3. Hyperlipidemia. 4. Hypertension. 5. Migraines. 6. History of herpes zoster.  ALLERGIES: 1. Aspirin. 2. Sulfa medications. 3. Cephalosporins. 4. ACE inhibitors. 5. Doxycycline. 6. Clindamycin. 7. Nonsteroidals. 8. Pneumovax. 9. Niacin.  MEDICATIONS: 1. Xanax 0.5 mg p.o. t.i.d. p.r.n. 2. Flexeril 10 mg p.o. t.i.d.  p.r.n. 3. Cymbalta 120 mg p.o. daily. 4. TriCor 160 mg p.o. daily. 5. Lasix 40 mg p.o. daily. 6. Neurontin 600 mg p.o. t.i.d. 7. Vicodin (5/500) 1-2 p.o. q.6 hours p.r.n. 8. Bystolic 5 mg p.o. daily. 9. Phenergan 25 mg p.o. q.6 hours p.r.n. 10.Crestor 20 mg p.o. daily. 11.Imitrex 100 mg p.o. as needed for migraines. 12.Trilipix 135 mg p.o. daily. 13.Valtrex 2000 mg q.12 hours p.r.n. 14.Ambien 10 mg p.o. q.h.s. p.r.n.  SOCIAL HISTORY:  Negative for tobacco use.  There is no alcohol use. She has no obvious occupational exposures. She is on disability.  FAMILY HISTORY:  Noncontributory.  REVIEW OF SYSTEMS:  As stated in history of present illness.  No additional findings are noted on 12 system review.  PHYSICAL EXAMINATION:  This is an obese, white female in no obvious distress.  She is alert and oriented x3.  Vital signs:  Temperature of 97, pulse 84, respiratory rate 20, blood pressure 119/83.  Weight is 371.  Head and Neck: Normocephalic, atraumatic skull.  There are no ocular or oral lesions.  She has no palpable cervical or supraclavicular lymph nodes.  Thyroid is not palpable.  Lungs:  Decreased breath sounds at the bases.  She has relatively decent breath sounds bilaterally. Cardiac:  Regular rate and rhythm with a normal S1, S2.  She has no murmurs, rubs or bruits.  Abdomen:  A morbidly obese abdomen.  Her abdomen is soft.  She has good bowel sounds.  There is no guarding or rebound tenderness.  She has no palpable hepatosplenomegaly. Back:  No tenderness over the spine, ribs, or hips.  Extremities: Some chronic stasis dermatitis changes in her lower legs.  She has 1+ edema in her legs.  Skin:  No rashes, ecchymoses or petechia.  Axillary exam shows no bilateral axillary adenopathy.  LABORATORY STUDIES:  White cell count is 9.7, hemoglobin 13.9, hematocrit 42.8, platelet count 271.  White cell differential shows 60 segs, 29 lymphocytes, 7 monos, 4  eosinophils.  Peripheral smear shows a normochromic, normocytic population of red blood cells.  There are no nucleated red blood cells.  There are no teardrop cells.  I see no rouleaux formation.  There are no target cells.  White cells appear normal in morphology and maturation.  I do not see any atypical lymphocytes.  I do not see any hypersegmented polys.  There are no immature myeloid cells.  Platelets are adequate in number and size.  IMPRESSION:  [name] is a very charming 41 year old, white female with this retroperitoneal lymph node.  Again, I do not have any scans or reports.  Regardless, we are going to have to get another CT scan on her.  If this was any type of malignancy, then we would be seeing growth and progression in a 6 month time frame.  I cannot imagine this being from her history of cervical cancer.  This was stage I disease from what Ms Macias tells me.  This was, I think, 14 years ago.  I think it would be highly unlikely for stage I cervical cancer to recur after 14 years.  Again, we will get her set up with a CT scan.  I am sure the radiologist will be able to compare the scan done up at [facility].  If we do find issues, then we will get Ms. Kreiser back.  I really believe that this gastric bypass is going to be the most important thing that [name] can do for herself.  I think even if we are dealing with an underlying malignancy, [name] really needs to get some of her weight off.  If we ever had to treat her with chemo, then her weight loss would allow [place] to more accurately dose her chemotherapy.  I spent a good hour or more with [name].  We had good conversation about of the findings.  I told again that I really was not sure as to what we are dealing with.  Once I get the results of the scans back, then we will be able to talk to [name].  I want to keep everything over the phone for her to make it easier for her.  Ultimately, I really  believe that the gastric bypass is going to be the best thing that can be done for her.  I would be shocked if we find anything that would stop her from having this gastric bypass procedure.  Again, I spent a good hour or more with Ms. Kishi.  I answered all of her questions.  I gave her a prayer blanket, which she very much appreciated.    ______________________________ Josph Macho, M.D. PRE/MEDQ  D:  11/03/2011  T:  11/04/2011  Job:  [id number]

## 2011-11-08 ENCOUNTER — Telehealth: Payer: Self-pay | Admitting: Family Medicine

## 2011-11-08 NOTE — Telephone Encounter (Signed)
Refill: Clonazepam 1mg  tablets. Take 1 tablet by mouth at bedtime as needed for neuropathy/restless leg. Qty 30. Last fill 10-11-11

## 2011-11-08 NOTE — Telephone Encounter (Signed)
Ok for #30, 3 refills 

## 2011-11-08 NOTE — Telephone Encounter (Signed)
Last OV 10-19-11 last refill 07-07-11 #30 with 3 refills

## 2011-11-09 ENCOUNTER — Telehealth: Payer: Self-pay | Admitting: *Deleted

## 2011-11-09 ENCOUNTER — Ambulatory Visit (HOSPITAL_BASED_OUTPATIENT_CLINIC_OR_DEPARTMENT_OTHER)
Admission: RE | Admit: 2011-11-09 | Discharge: 2011-11-09 | Disposition: A | Discharge status: Home / Self Care | Payer: Medicaid Other | Source: Ambulatory Visit | Attending: Hematology & Oncology | Admitting: Hematology & Oncology

## 2011-11-09 DIAGNOSIS — K7689 Other specified diseases of liver: Secondary | ICD-10-CM | POA: Insufficient documentation

## 2011-11-09 DIAGNOSIS — Z8541 Personal history of malignant neoplasm of cervix uteri: Secondary | ICD-10-CM | POA: Insufficient documentation

## 2011-11-09 DIAGNOSIS — R599 Enlarged lymph nodes, unspecified: Secondary | ICD-10-CM | POA: Insufficient documentation

## 2011-11-09 DIAGNOSIS — R590 Localized enlarged lymph nodes: Secondary | ICD-10-CM

## 2011-11-09 MED ORDER — CLONAZEPAM 1 MG PO TABS: 1.0000 mg | ORAL_TABLET | Freq: Every evening | ORAL | Status: DC | PRN

## 2011-11-09 MED ORDER — IOHEXOL 300 MG/ML  SOLN: 100.0000 mL | Freq: Once | INTRAMUSCULAR | Status: AC | PRN

## 2011-11-09 NOTE — Telephone Encounter (Signed)
.  rx faxed to pharmacy, manually.  

## 2011-11-09 NOTE — Telephone Encounter (Signed)
Pt left vm about two concerns that she wants to receive a doctors note for, pt notes she has upcoming jury duty in July and notes that sitting for that length of time will be very painful for her and wanted to know if MD Beverely Low can write her note to excuse her from jury duty  Pt noted that she received a seat belt ticket recently and notes that currently she can not wear the seatbelt in her car per does not fit, pt wanted a doctor's note to keep in her car stating that she can be except from wearing a seatbelt at this time however will note she will wear in the upcoming future after her upcoming surgery, please advise

## 2011-11-14 ENCOUNTER — Telehealth: Payer: Self-pay | Admitting: *Deleted

## 2011-11-14 NOTE — Telephone Encounter (Signed)
Called patient to let her know that her CT scan looked ok per dr. Myna Hidalgo no enlarged lymph nodes anywhere

## 2011-11-14 NOTE — Telephone Encounter (Signed)
Message copied by Anselm Jungling on Mon Nov 14, 2011 11:58 AM ------      Message from: [name]      Created: Sun Nov 13, 2011  8:06 PM       Call - no enlarged lymph nodes anywhere!!!  CT scan looks ok!!  pete

## 2011-11-15 ENCOUNTER — Encounter: Payer: Self-pay | Admitting: Family Medicine

## 2011-11-15 ENCOUNTER — Ambulatory Visit: Payer: Medicaid Other | Admitting: Family Medicine

## 2011-11-15 ENCOUNTER — Ambulatory Visit (INDEPENDENT_AMBULATORY_CARE_PROVIDER_SITE_OTHER): Payer: Medicaid Other | Admitting: Family Medicine

## 2011-11-15 VITALS — BP 139/80 | HR 100 | Temp 98.3°F | Ht 63.0 in | Wt 373.7 lb

## 2011-11-15 DIAGNOSIS — B369 Superficial mycosis, unspecified: Secondary | ICD-10-CM | POA: Insufficient documentation

## 2011-11-15 DIAGNOSIS — F411 Generalized anxiety disorder: Secondary | ICD-10-CM

## 2011-11-15 MED ORDER — CLOTRIMAZOLE-BETAMETHASONE 1-0.05 % EX CREA: TOPICAL_CREAM | Freq: Two times a day (BID) | CUTANEOUS | Status: DC

## 2011-11-15 NOTE — Patient Instructions (Addendum)
Apply the Lotrisone cream twice daily Call the disability attorney and let them know the Medicaid situation to see if they can push things forward Call with any questions or concerns Hang in there!!  Don't quit on me!!!

## 2011-11-15 NOTE — Progress Notes (Signed)
  Subjective:    Patient ID: [name], female    DOB: Apr 05, 1971, 41 y.o.   MRN: [id number]  HPI Rash- red spot on abd, no pain, not itchy.  First noticed 2 days ago.  Depression- pt is being told that she'll lose her Medicaid after August 1st which means she can't get meds and won't be able to get surgery.  Still waiting on disability.  Was just at surgeon prior to arrival and this is what has her so upset.  'i'm just tired.  i'm just tired of fighting'.  Denies thoughts of harming herself.   Review of Systems For ROS see HPI     Objective:   Physical Exam  Vitals reviewed. Constitutional: She appears well-developed and well-nourished.       Morbidly obese, tearful  Skin: Skin is warm and dry. Rash (fungal dermatitis in panus crease on R abd) noted.  Psychiatric:       Tearful, very upset.  Talking about giving up.  Denies HI/SI.          Assessment & Plan:

## 2011-11-16 ENCOUNTER — Other Ambulatory Visit: Payer: Self-pay | Admitting: Gynecology

## 2011-11-16 DIAGNOSIS — Z1231 Encounter for screening mammogram for malignant neoplasm of breast: Secondary | ICD-10-CM

## 2011-11-21 ENCOUNTER — Telehealth: Payer: Self-pay | Admitting: Family Medicine

## 2011-11-21 MED ORDER — HYDROCODONE-ACETAMINOPHEN 5-500 MG PO TABS: 1.0000 | ORAL_TABLET | Freq: Four times a day (QID) | ORAL | Status: DC | PRN

## 2011-11-21 NOTE — Telephone Encounter (Signed)
Ok for #60, 1 refill 

## 2011-11-21 NOTE — Telephone Encounter (Signed)
Last OV 11-15-11 last refill 09-14-11 #60 with 1 refill

## 2011-11-21 NOTE — Telephone Encounter (Signed)
Refill: Hydrocodone/acetaminophen 5-500mg . Take 1 to 2 tablets by mouth every 6 hours as needed for pain. Qty 60. Last fill 10-11-11

## 2011-11-21 NOTE — Telephone Encounter (Signed)
.  rx faxed to pharmacy, manually.  

## 2011-11-22 ENCOUNTER — Telehealth: Payer: Self-pay | Admitting: *Deleted

## 2011-11-22 NOTE — Telephone Encounter (Signed)
Pt advised that during last OV MD [name] offered to assist her in any way with her disability claim, pt notes that disability office is advising they need a letter and office notations to support the pt need for disability other than her weight issue per pt notes that after she has the surgery they are considering the she will no longer have the weight problem and will no longer need the disability, however pt notes that after first disability claim she was later diagnosed with Joint disease, and neuropathy as well as sleep apnea that will not go away with the weight loss, pt advised to fax any letters/information to [phone] attention [address] please advise

## 2011-11-23 ENCOUNTER — Ambulatory Visit: Payer: Medicaid Other | Admitting: Family Medicine

## 2011-11-23 NOTE — Telephone Encounter (Signed)
We can certainly fax office notes.  Can refer to different rheum if needed once pt figures out if this is necessary

## 2011-11-23 NOTE — Telephone Encounter (Signed)
Pt noted that MD [name] actually dx her neuropathy per was given klonopin RX to assist and was advised if the klonpin helped she would not send her to a neuro office, pt noted klonpin worked and the referral to neuro was not advised further, requested all OV faxed to the disability office as well

## 2011-11-23 NOTE — Telephone Encounter (Signed)
Pt's sleep apnea is not a disability and may absolutely improve w/ weight loss- as may her joint disease.  The neuropathy is a different story but the testing physician (neuro or other) would be best to provide this letter.

## 2011-11-23 NOTE — Telephone Encounter (Signed)
Opened wrong phone note in error

## 2011-11-23 NOTE — Telephone Encounter (Signed)
Pt called back to also advise that she was sent for MRI sometime ago that noted she had discs missing and this was also noted as possible reason for the neuropathy, FYI pt noted issue with [facility] office and was noted she may have to go to a different Rhuematology

## 2011-11-24 ENCOUNTER — Encounter: Payer: Medicaid Other | Admitting: Gynecology

## 2011-11-25 ENCOUNTER — Telehealth: Payer: Self-pay | Admitting: Family Medicine

## 2011-11-25 NOTE — Telephone Encounter (Signed)
Caller: [name]/Patient; PCP: Sheliah Hatch.; CB#: [phone]; ; ; Call regarding Back Pain; No injury. Has HX of DJD to 3-4 disc to lower back. Pain is severe. Onset last night, 11/24/11. Takes Hydrocodone 5/500 1-1.5 Q8H PRN,  Flexaril TID and Neurontin 600MG  TID. Was disconnected from pt. Attempted to call her back X 7 with no luck. Left message on VM to call back if needed.

## 2011-11-25 NOTE — Telephone Encounter (Signed)
Thanks for offering so many appts and working so hard!

## 2011-11-25 NOTE — Telephone Encounter (Signed)
Patient called in at 11am stating she had an appt on Wednesday but wanted to come in earlier, due to back pain. I offered the 1st available today and PT. Refused, ask me to check on Monday's sch., I offered 1st available she stated that would not work. I have transferred her to CAN for triage. Just wanted you to have an update

## 2011-11-25 NOTE — Telephone Encounter (Signed)
Nothing to do at this time

## 2011-11-25 NOTE — Telephone Encounter (Signed)
Noted upcoming apt on 11-30-11, MD Beverely Low made aware

## 2011-11-25 NOTE — Telephone Encounter (Signed)
Patient called back after being transferred to CAN with no call back crying in pain stated the medication prescribed in note below is not touching her pain. I did transfer again to CAN as she again has refused appointment.

## 2011-11-29 NOTE — Telephone Encounter (Signed)
All OV dated back to 12-2010 to number provided, pt will call office if she does want to change rheum MD so that we can set up a referral for new MD evaluation. Pt understood all instructions.

## 2011-11-29 NOTE — Telephone Encounter (Signed)
Opened in error

## 2011-11-30 ENCOUNTER — Encounter: Payer: Self-pay | Admitting: Family Medicine

## 2011-11-30 ENCOUNTER — Ambulatory Visit (INDEPENDENT_AMBULATORY_CARE_PROVIDER_SITE_OTHER): Payer: Medicaid Other | Admitting: Family Medicine

## 2011-11-30 ENCOUNTER — Ambulatory Visit
Admission: RE | Admit: 2011-11-30 | Discharge: 2011-11-30 | Disposition: A | Discharge status: Home / Self Care | Payer: Medicaid Other | Source: Ambulatory Visit | Attending: Gynecology | Admitting: Gynecology

## 2011-11-30 VITALS — BP 132/78 | HR 86 | Temp 97.5°F | Ht 62.75 in | Wt 376.0 lb

## 2011-11-30 DIAGNOSIS — F411 Generalized anxiety disorder: Secondary | ICD-10-CM

## 2011-11-30 DIAGNOSIS — Z1231 Encounter for screening mammogram for malignant neoplasm of breast: Secondary | ICD-10-CM

## 2011-11-30 DIAGNOSIS — E669 Obesity, unspecified: Secondary | ICD-10-CM

## 2011-11-30 DIAGNOSIS — E785 Hyperlipidemia, unspecified: Secondary | ICD-10-CM

## 2011-11-30 DIAGNOSIS — M5136 Other intervertebral disc degeneration, lumbar region: Secondary | ICD-10-CM

## 2011-11-30 DIAGNOSIS — M5137 Other intervertebral disc degeneration, lumbosacral region: Secondary | ICD-10-CM

## 2011-11-30 LAB — HEPATIC FUNCTION PANEL
Albumin: 3.5 g/dL (ref 3.5–5.2)
Alkaline Phosphatase: 74 U/L (ref 39–117)
Total Protein: 7.2 g/dL (ref 6.0–8.3)

## 2011-11-30 LAB — LIPID PANEL
Cholesterol: 151 mg/dL (ref 0–200)
HDL: 33.3 mg/dL — ABNORMAL LOW (ref >39.00)
Triglycerides: 232 mg/dL — ABNORMAL HIGH (ref 0.0–149.0)
VLDL: 46.4 mg/dL — ABNORMAL HIGH (ref 0.0–40.0)

## 2011-11-30 MED ORDER — OXYCODONE-ACETAMINOPHEN 5-325 MG PO TABS: 1.0000 | ORAL_TABLET | ORAL | Status: DC | PRN

## 2011-11-30 NOTE — Assessment & Plan Note (Signed)
Deteriorated.  Pt continues to gain weight.  13 lbs since beginning of June.  Plan is still to proceed w/ gastric sleeve at [facility].  Feel this is in pt's best interest as she seems unable to lose weight on her own.

## 2011-11-30 NOTE — Assessment & Plan Note (Signed)
Improved since last visit.  Pt not tearful.  Able to discuss current situation calmly.  Will continue to follow.

## 2011-11-30 NOTE — Assessment & Plan Note (Signed)
Chronic problem.  Tolerating statin w/out difficulty.  Due for labs.  Adjust meds prn. 

## 2011-11-30 NOTE — Assessment & Plan Note (Signed)
New to provider.  Pt w/ MRI evidence of this.  Pt has required multiple trips to ER due to poorly controlled pain.  Will provide small supply of percocet for pt to use in case of severe pain.  Reviewed supportive care and red flags that should prompt return.  Pt expressed understanding and is in agreement w/ plan.

## 2011-11-30 NOTE — Patient Instructions (Addendum)
We'll notify you of your lab results and make any changes if needed Follow up w/ [facility] for the next however many months so you can get this surgery!!! Take the Percocet when you have severe back pain Call with any questions or concerns Hang in there!!!

## 2011-11-30 NOTE — Progress Notes (Signed)
  Subjective:    Patient ID: [name], female    DOB: Jun 07, 1970, 41 y.o.   MRN: [id number]  HPI Obesity- chronic problem, has gained 13 lbs since 6/5.  Is not exercising due to pain and neuropathy.  Has DDD in lumbar spine.  Plan is still to move forward w/ surgery in 3 months.  Anxiety/Depression- saw psych to get clearance for surgery.  Has upcoming counseling and plan is to adjust Cymbalta.  Feeling better than previously.  DDD- pt has vicodin, phenergan available to use at home but has had multiple times that back pain is severe enough to require ER visits.  In ER pt gets Dilaudid and phenergan which 'takes the edge off'.  Wants to know what's available to take for severe pain.    Hyperlipidemia- chronic problem.  Needs Crestor and Trilipix refills.  Denies abd pain, N/V.  Due for labs.   Review of Systems For ROS see HPI     Objective:   Physical Exam  Vitals reviewed. Constitutional: She is oriented to person, place, and time. She appears well-developed and well-nourished. No distress.       Morbidly obese  HENT:  Head: Normocephalic and atraumatic.  Eyes: Conjunctivae and EOM are normal. Pupils are equal, round, and reactive to light.  Neck: Normal range of motion. Neck supple. No thyromegaly present.  Cardiovascular: Normal rate, regular rhythm, normal heart sounds and intact distal pulses.   No murmur heard. Pulmonary/Chest: Effort normal and breath sounds normal. No respiratory distress.  Abdominal: Soft. Bowel sounds are normal. There is no tenderness. There is no rebound and no guarding.  Musculoskeletal: She exhibits no edema.  Lymphadenopathy:    She has no cervical adenopathy.  Neurological: She is alert and oriented to person, place, and time.  Skin: Skin is warm and dry.  Psychiatric: She has a normal mood and affect. Her behavior is normal.          Assessment & Plan:

## 2011-12-01 ENCOUNTER — Encounter: Payer: Medicaid Other | Admitting: Gynecology

## 2011-12-01 NOTE — Telephone Encounter (Signed)
Updated with MD Beverely Low that the pt was excused from [employer] based on her visit to the courtroom was noted to almost have a panic/heart attack there and was then excused from jury duty

## 2011-12-02 MED ORDER — GABAPENTIN 600 MG PO TABS: 600.0000 mg | ORAL_TABLET | Freq: Three times a day (TID) | ORAL | Status: DC

## 2011-12-02 MED ORDER — ROSUVASTATIN CALCIUM 20 MG PO TABS: 20.0000 mg | ORAL_TABLET | Freq: Every day | ORAL | Status: DC

## 2011-12-02 MED ORDER — CHOLINE FENOFIBRATE 135 MG PO CPDR: 135.0000 mg | DELAYED_RELEASE_CAPSULE | Freq: Every day | ORAL | Status: DC

## 2011-12-02 NOTE — Addendum Note (Signed)
Addended by: Derry Lory A on: 12/02/2011 10:13 AM   Modules accepted: Orders

## 2011-12-06 ENCOUNTER — Encounter: Payer: Medicaid Other | Admitting: Gynecology

## 2011-12-06 ENCOUNTER — Other Ambulatory Visit: Payer: Self-pay | Admitting: Family Medicine

## 2011-12-06 MED ORDER — FUROSEMIDE 40 MG PO TABS: 40.0000 mg | ORAL_TABLET | Freq: Every day | ORAL | Status: DC

## 2011-12-06 NOTE — Telephone Encounter (Signed)
rx sent to pharmacy by e-script  

## 2011-12-06 NOTE — Assessment & Plan Note (Signed)
New.  Start Lotrisone cream and reviewed importance of keeping area clean and dry.

## 2011-12-06 NOTE — Telephone Encounter (Signed)
Refill Furosemide 40mg  tablets #30, take 1 tablet by mouth daily last fill 06.17.13, last ov 7.17.13,

## 2011-12-06 NOTE — Assessment & Plan Note (Signed)
Deteriorated.  Pt very upset about insurance situation.  Able to contract for safety.  Current sxs are likely situational and will improve w/ time- reeling from possibility that she won't be able to have surgery.  Will follow closely.

## 2011-12-13 ENCOUNTER — Telehealth: Payer: Self-pay | Admitting: *Deleted

## 2011-12-13 NOTE — Telephone Encounter (Signed)
Pt called to advise she was only given #30 of her xanax and she is not currently out however notes recent elevated stress levels caused her to use them more than she normally does, noted #120 xanax with 0 refills was sent on 10-18-11, noting possible that pharmacy possibly gave her 90 the first pick up and 30 the next, offered to call pharmacy, pt wanted to clarify as well and return call to our office to advise, pt also wanted to inform MD [name] that she is faxing over a form from medicaid that is requesting that she change PCP however pt wants to continue receiving care from MD Beverely Low, please note this nurse will also contact pharmacy about xanax refill and note update at that time

## 2011-12-15 MED ORDER — ALPRAZOLAM 0.5 MG PO TABS: 0.5000 mg | ORAL_TABLET | Freq: Three times a day (TID) | ORAL | Status: DC | PRN

## 2011-12-15 NOTE — Telephone Encounter (Signed)
Refill request from [facility] Dr: Alprazolam 0.5mg  tablets. Take 1 tablet by mouth three times daily as needed for sleep or anxiety. Qty 120. Last fill 11-19-11

## 2011-12-15 NOTE — Telephone Encounter (Signed)
Noted verbal order from MD [name] to fill pt xanax per noted last refill 10-18-11 with #120 no refills, faxed signed RX to pharamcy

## 2011-12-19 NOTE — Telephone Encounter (Signed)
Pt left vm on triage line to advise she was recently told she needs to have a "Spine Test" per DDD and wants to know if this is like a spinal tap, if MD [name] could possibly explain, tried to call pt back to clarify MD that advised however unable to contact at this moment, will continue to call pt with verification

## 2011-12-19 NOTE — Telephone Encounter (Signed)
No- for DDD this is usually a type of imaging procedure, NOT a spinal tap.  The type of imaging will depend on the doctor

## 2011-12-20 NOTE — Telephone Encounter (Signed)
Called pt to advise and pt noted she was actually told by her disability rep at [employer] that she needs to have this done to back up the DX for her DDD and that her PCP will need to schedule this, however pt did not think the testing is necessary but will go if MD Beverely Low advises it is in her best interest.

## 2011-12-21 ENCOUNTER — Other Ambulatory Visit: Payer: Self-pay | Admitting: Family Medicine

## 2011-12-21 MED ORDER — PROMETHAZINE HCL 25 MG PO TABS: 25.0000 mg | ORAL_TABLET | Freq: Four times a day (QID) | ORAL | Status: DC | PRN

## 2011-12-21 NOTE — Telephone Encounter (Signed)
Spoke to pt to advise results/instructions. Pt understood. Pt noted that the lea son from the [facility] to advise that her case should be evaluated very soon, per pt had written the [facility] to advise she has been fighting case since 12-2008, pt advised that she does not want the testing  performed and if further need for the testing advised she will call our office if any further assistance needed

## 2011-12-21 NOTE — Telephone Encounter (Signed)
PROMETHAZINE 25MG  TABLETS QTY:30 LAST REFILL: 10/26/11 TAKE 1 TABLET BY MOUTH EVERY 6 HOURS AS NEEDED FOR NAUSEA

## 2011-12-21 NOTE — Telephone Encounter (Signed)
Her disability doctor needs to specify what type of study they would like done and honestly- they should order this if they want it or think it is necessary

## 2011-12-21 NOTE — Telephone Encounter (Signed)
rx sent to pharmacy by e-script  

## 2012-01-04 ENCOUNTER — Other Ambulatory Visit: Payer: Self-pay | Admitting: Family Medicine

## 2012-01-04 MED ORDER — NEBIVOLOL HCL 5 MG PO TABS: 5.0000 mg | ORAL_TABLET | Freq: Every day | ORAL | Status: DC

## 2012-01-04 NOTE — Telephone Encounter (Signed)
rx sent to pharmacy by e-script  

## 2012-01-04 NOTE — Telephone Encounter (Signed)
Bystolic 5 mg tablets Qty:30 Take 1 tablet by mouth daily

## 2012-01-09 ENCOUNTER — Ambulatory Visit (INDEPENDENT_AMBULATORY_CARE_PROVIDER_SITE_OTHER): Payer: Medicaid Other | Admitting: Family Medicine

## 2012-01-09 ENCOUNTER — Encounter: Payer: Self-pay | Admitting: Family Medicine

## 2012-01-09 VITALS — Temp 97.7°F | Ht 62.25 in | Wt 371.2 lb

## 2012-01-09 DIAGNOSIS — L409 Psoriasis, unspecified: Secondary | ICD-10-CM | POA: Insufficient documentation

## 2012-01-09 DIAGNOSIS — L408 Other psoriasis: Secondary | ICD-10-CM

## 2012-01-09 MED ORDER — CLOBETASOL PROPIONATE 0.05 % EX OINT: TOPICAL_OINTMENT | Freq: Two times a day (BID) | CUTANEOUS | Status: AC

## 2012-01-09 NOTE — Assessment & Plan Note (Signed)
New.  Start high dose topical steroid ointment.  Reviewed supportive care and red flags that should prompt return.  Pt expressed understanding and is in agreement w/ plan.

## 2012-01-09 NOTE — Progress Notes (Signed)
  Subjective:    Patient ID: [name], female    DOB: 18-Oct-1970, 41 y.o.   MRN: [id number]  HPI Rash- white scaly plaques on extensor surface of elbows bilaterally.  Has been using [employer] ultimate healing lotion w/out relief.  Present for ~8 months.  Doesn't itch, will occasionally be sore.  No other similar areas.   Review of Systems For ROS see HPI     Objective:   Physical Exam  Vitals reviewed. Constitutional: She appears well-developed and well-nourished. No distress.       Morbidly obese  Skin: Skin is warm and dry. Rash (white plaque like lesions on extensor surfaces of elbows) noted.          Assessment & Plan:

## 2012-01-09 NOTE — Patient Instructions (Addendum)
This appears to be psoriasis Start the Clobetasol ointment twice daily to the elbows- stop when symptoms improve.  Repeat as needed CONGRATS on upcoming surgery!!! Hang in there!!!

## 2012-01-10 ENCOUNTER — Other Ambulatory Visit: Payer: Self-pay | Admitting: Family Medicine

## 2012-01-10 NOTE — Telephone Encounter (Signed)
Refills x 3 last ov 8.26.13 - acute, last labs 7.17.13 LDL Lipids Hepatic   1-Cyclobenzaprine (Tab) 10 MG Take 1 tablet (10 mg total) by mouth 3 (three) times daily as needed for muscle spasms.   #90, last fill 7.31.13  2-CYMBALTA 60 MG Take 2 capsules (120 mg total) by mouth every morning #60, last fill 8.5.13  3-Hydrocodone-Acetaminophen (Tab) 5-500 MG Take 1-2 tablets by mouth every 6 (six) hours as needed. For pain  #60 last fill 8.2.13

## 2012-01-10 NOTE — Telephone Encounter (Signed)
Last OV 01-09-12 last refill on Hydrocodone 11-21-11 #60 with 1 refill, last refill on flex #90 with 1 refill

## 2012-01-10 NOTE — Telephone Encounter (Signed)
1 week too early for refills

## 2012-01-13 MED ORDER — HYDROCODONE-ACETAMINOPHEN 5-500 MG PO TABS: 1.0000 | ORAL_TABLET | Freq: Four times a day (QID) | ORAL | Status: DC | PRN

## 2012-01-13 MED ORDER — CYCLOBENZAPRINE HCL 10 MG PO TABS: 10.0000 mg | ORAL_TABLET | Freq: Three times a day (TID) | ORAL | Status: DC | PRN

## 2012-01-13 MED ORDER — ALPRAZOLAM 0.5 MG PO TABS: 0.5000 mg | ORAL_TABLET | Freq: Three times a day (TID) | ORAL | Status: DC | PRN

## 2012-01-13 MED ORDER — DULOXETINE HCL 60 MG PO CPEP: 120.0000 mg | ORAL_CAPSULE | Freq: Every day | ORAL | Status: DC

## 2012-01-13 NOTE — Telephone Encounter (Signed)
Pt called back to advise that she also needs xanax, last refill noted 12-15-11 #120 with no refills TID, please advise

## 2012-01-13 NOTE — Telephone Encounter (Signed)
Ok for #90, this is not to be taken regularly (120 is more than a 1 month supply based on TID dosing)

## 2012-01-13 NOTE — Telephone Encounter (Signed)
Ok for #60 w/ 1 refill (hydrocodone), ok for #90, 1 refill flexeril Ok for 6 months of cymbalta

## 2012-01-13 NOTE — Telephone Encounter (Signed)
Please review per pt left another message to advise trying to fill

## 2012-01-13 NOTE — Telephone Encounter (Signed)
.  rx faxed to pharmacy, manually.  

## 2012-01-18 ENCOUNTER — Telehealth: Payer: Self-pay | Admitting: Pulmonary Disease

## 2012-01-18 ENCOUNTER — Telehealth: Payer: Self-pay | Admitting: *Deleted

## 2012-01-18 NOTE — Telephone Encounter (Signed)
I have sent letter out to the address listed below. Pt is aware of this. Nothing further was needed

## 2012-01-18 NOTE — Telephone Encounter (Signed)
We can provide a letter b/c this is a true statement

## 2012-01-18 NOTE — Telephone Encounter (Signed)
Done

## 2012-01-18 NOTE — Telephone Encounter (Signed)
Pt reports that she lost her Medicaid as of 01-14-12 and she hopes to get it back soon.  In the mean time she needs a letter sent to [facility] stating that she is required to use CPAP due to Dx of sleep apnea.  Letter needs to be sent to [facility] and Appeals  [address] [place], [place] [address].  Please advise.

## 2012-01-18 NOTE — Telephone Encounter (Signed)
Pt called in to advise that her medicaid was stopped on 01-13-12, and she was advised by the medicaid supervisor to have a letter from her 3 MD's written stating the she is in need of her current medications and c-pap equipment in order to survive, please advise

## 2012-01-18 NOTE — Telephone Encounter (Signed)
LMOMTCB x 1 

## 2012-01-20 NOTE — Telephone Encounter (Signed)
Called pt to advise the letter is ready for pick up, pt will pick up on monday

## 2012-01-30 ENCOUNTER — Telehealth: Payer: Self-pay | Admitting: *Deleted

## 2012-01-30 MED ORDER — NYSTATIN-TRIAMCINOLONE 100000-0.1 UNIT/GM-% EX OINT: TOPICAL_OINTMENT | Freq: Two times a day (BID) | CUTANEOUS | Status: DC

## 2012-01-30 NOTE — Telephone Encounter (Signed)
Pt called in to advise her rash has worsened and the cream given is NOT helping, can something else be called in notations made at OV 01-09-12:  This appears to be psoriasis  Start the Clobetasol ointment twice daily to the elbows- stop when symptoms improve. Repeat as needed  CONGRATS on upcoming surgery!!!  Hang in there!!!

## 2012-01-30 NOTE — Telephone Encounter (Signed)
Can switch from Lotrisone to Mycolog ointment BID, disp 120g, 1 refill

## 2012-01-30 NOTE — Telephone Encounter (Signed)
Called pt to advise medication switch, left detailed message per verbal given to leave on cell, left detailed message about new medication, dosage, directions and that the medication has been sent to the pharmacy noted in the chart, if any further assistance needed to contact office, sent via escribe

## 2012-02-07 ENCOUNTER — Telehealth: Payer: Self-pay | Admitting: *Deleted

## 2012-02-07 MED ORDER — TRIAMCINOLONE ACETONIDE 0.1 % EX OINT: TOPICAL_OINTMENT | Freq: Two times a day (BID) | CUTANEOUS | Status: DC

## 2012-02-07 MED ORDER — ZINC OXIDE 20 % EX OINT: 1.0000 "application " | TOPICAL_OINTMENT | Freq: Two times a day (BID) | CUTANEOUS | Status: DC

## 2012-02-07 NOTE — Telephone Encounter (Signed)
rx sent to pharmacy by e-script, called pt to advise, pt noted she had to switch pharmacy to [facility] and noted she has no more refills on her Klonpin, xanax and percocet and requested a refill on all of these medications please, advised the percocet will have to be picked up, pt understood, please advise  Last OV 01-09-12 Klonopin #30 with 3 refills on 11-09-11 Xanax #90 no refills on 01-13-12 Percocet #30 no refills on 11-30-11  Please advise

## 2012-02-07 NOTE — Telephone Encounter (Signed)
Returned pt call per pt left vm stating that the ointment recently sent was going to cost $400.00 dollars and $600.00 at [facility], was advised by pharmacy rep at [facility] that if the medication is sent in another form instead of the combo, such as nystatin that this will be much cheaper for the pt, please advise, pt wanted to wait for MD [name] to review instead of another MD when advised she is out of office,

## 2012-02-07 NOTE — Telephone Encounter (Signed)
Ok to split into Nystatin cream and triamcinolone 0.1% ointment (or she can use the Clobetasol that she already has)

## 2012-02-08 MED ORDER — ALPRAZOLAM 0.5 MG PO TABS: 0.5000 mg | ORAL_TABLET | Freq: Three times a day (TID) | ORAL | Status: DC | PRN

## 2012-02-08 MED ORDER — OXYCODONE-ACETAMINOPHEN 5-325 MG PO TABS: 1.0000 | ORAL_TABLET | ORAL | Status: AC | PRN

## 2012-02-08 NOTE — Telephone Encounter (Signed)
Called pt to advise she will need to pick up the percocet RX located at the front desk, for Xanax #90 no refills and percocet #30 no refills to be picked up together and taken to the phamacy, however pt number is noted as all circuits busy, will call pt back again to advise

## 2012-02-08 NOTE — Telephone Encounter (Signed)
Should not need any Klonopin refills until 10/26 (total of 4 months given) Ok for xanax and percocet refills

## 2012-02-09 MED ORDER — NYSTATIN 100000 UNIT/GM EX OINT: TOPICAL_OINTMENT | Freq: Two times a day (BID) | CUTANEOUS | Status: DC

## 2012-02-09 NOTE — Telephone Encounter (Signed)
Spoke to pt to advise results/instructions. Pt understood. Advised her Rx are ready for pick up and that the ointment will be sent to the pharmacy

## 2012-02-23 ENCOUNTER — Telehealth: Payer: Self-pay | Admitting: *Deleted

## 2012-02-23 NOTE — Telephone Encounter (Signed)
Pt left vm to request samples for cymbalta and bystolic, advised verbal from MD [name] to place up front for pt to pick up, called pt to advise, noted vm pick up and left message to note the samples are up front for both medications for pick up

## 2012-03-06 ENCOUNTER — Encounter: Payer: Self-pay | Admitting: Family Medicine

## 2012-03-06 DIAGNOSIS — Z0279 Encounter for issue of other medical certificate: Secondary | ICD-10-CM

## 2012-03-07 ENCOUNTER — Telehealth: Payer: Self-pay | Admitting: *Deleted

## 2012-03-07 NOTE — Telephone Encounter (Signed)
Pt left vm to advise she needs more samples for cymbalta and crestor as well as imitrex, noted we do not care imitrex samples, pt also advised her father would be in office to have a flu vaccine today and he can pick up her samples for her, placed samples at front desk and called pt to advise no samples for the imitrex, left vm to advise pt

## 2012-03-12 ENCOUNTER — Telehealth: Payer: Self-pay | Admitting: Family Medicine

## 2012-03-12 NOTE — Telephone Encounter (Signed)
Ok to refill 

## 2012-03-12 NOTE — Telephone Encounter (Signed)
Refill: Hydroco/acetaminophen 5-500mg  tab. Take one to two tablets by mouth every 6 hours as needed for pain. Qty 60. Last fill 02-07-12

## 2012-03-12 NOTE — Telephone Encounter (Signed)
Ok for #60, 1 refill 

## 2012-03-13 ENCOUNTER — Telehealth: Payer: Self-pay | Admitting: *Deleted

## 2012-03-13 MED ORDER — HYDROCODONE-ACETAMINOPHEN 5-500 MG PO TABS: 1.0000 | ORAL_TABLET | Freq: Four times a day (QID) | ORAL | Status: DC | PRN

## 2012-03-13 NOTE — Telephone Encounter (Signed)
At this time would recommend continuing to use antifungal cream since it did improve the area- cannot change meds w/out viewing rash.  Fungal infections are notoriously difficult to treat and can take awhile to improve

## 2012-03-13 NOTE — Telephone Encounter (Signed)
.  rx faxed to pharmacy, manually.  

## 2012-03-13 NOTE — Telephone Encounter (Signed)
Pt advise that her rash is somewhat better but now has spread across her stomach and still itching, please note the last RX was sent in as a combo lotion but was too costly for the pt and had to be sent in separate tubes, noted this did help somewhat but not completely per has gone away in one area and comes back in other area's, pt advised will possibly have to come in for OV but wanted to see if MD Beverely Low would send a different medication per noted OV about this condition was on 01-09-12,please advise

## 2012-03-14 NOTE — Telephone Encounter (Signed)
Spoke to pt to advise results/instructions. Pt understood. Pt advised she will wait to see if it will resolve on its own, per declined an apt at this time

## 2012-03-14 NOTE — Telephone Encounter (Signed)
.  left message to have patient return my call.  

## 2012-03-22 ENCOUNTER — Telehealth: Payer: Self-pay | Admitting: General Practice

## 2012-03-22 NOTE — Telephone Encounter (Signed)
Pt called stating that she has not received her medications from the [facility]. Do we have any samples of Cymbalta or Bystolic that she could have? Please advise.

## 2012-03-22 NOTE — Telephone Encounter (Signed)
Pt notified that samples will be ready when she brings mom in for flu shot at 3pm.

## 2012-03-22 NOTE — Telephone Encounter (Signed)
Yes- can have 1-2 months of each

## 2012-03-26 ENCOUNTER — Telehealth: Payer: Self-pay

## 2012-03-26 NOTE — Telephone Encounter (Signed)
VM left advising patient the Trilipix from [facility] was place at check in and ready for pick up.       KP

## 2012-03-29 ENCOUNTER — Other Ambulatory Visit: Payer: Self-pay | Admitting: Family Medicine

## 2012-03-29 NOTE — Telephone Encounter (Signed)
Last OV 01-09-12, last filled 12-21-11 #30

## 2012-03-29 NOTE — Telephone Encounter (Signed)
refill Promethazine HCl (Tab) 25 MG Take 1 tablet (25 mg total) by mouth every 6 (six) hours as needed. For nausea  per fax from pharmacy last fill 2.4.11, Chart shows last wrt 8.7.13 #30 Last ov 8.26.13 acute

## 2012-03-30 NOTE — Telephone Encounter (Signed)
Ok for #30, 1 refill 

## 2012-04-03 ENCOUNTER — Encounter: Payer: Medicaid Other | Admitting: Gynecology

## 2012-04-06 ENCOUNTER — Other Ambulatory Visit: Payer: Self-pay

## 2012-04-06 MED ORDER — PROMETHAZINE HCL 25 MG PO TABS: 25.0000 mg | ORAL_TABLET | Freq: Four times a day (QID) | ORAL | Status: DC | PRN

## 2012-04-06 NOTE — Telephone Encounter (Signed)
Patient left a message requesting Phenergan be sent to [facility] on [address]. Please advise     KP

## 2012-04-06 NOTE — Telephone Encounter (Signed)
Rx sent 

## 2012-04-06 NOTE — Telephone Encounter (Signed)
Call from patient and she is requesting a refill for phenergan be sent to [facility] on [address].       KP

## 2012-04-06 NOTE — Telephone Encounter (Signed)
Ok for #45, no refills 

## 2012-04-10 ENCOUNTER — Telehealth: Payer: Self-pay | Admitting: Family Medicine

## 2012-04-10 ENCOUNTER — Encounter: Payer: Medicaid Other | Admitting: Gynecology

## 2012-04-10 MED ORDER — FUROSEMIDE 40 MG PO TABS: 40.0000 mg | ORAL_TABLET | Freq: Every day | ORAL | Status: DC

## 2012-04-10 NOTE — Telephone Encounter (Signed)
PT NEEDS samples of Abilify she is out

## 2012-04-10 NOTE — Telephone Encounter (Signed)
Patient is taking 5 mg of Abilify and waiting on Rx from  Pharm comp to be sent to the office and she is requesting samples. Please advise     KP

## 2012-04-10 NOTE — Telephone Encounter (Signed)
refill Furosemide (Tab) 40 MG Take 1 tablet (40 mg total) by mouth daily. #30 last ov 8.6.13 acute

## 2012-04-11 ENCOUNTER — Telehealth: Payer: Self-pay | Admitting: *Deleted

## 2012-04-11 NOTE — Telephone Encounter (Signed)
Pt aware bystolic sample ready for pickup.

## 2012-04-25 ENCOUNTER — Encounter: Payer: Self-pay | Admitting: Anesthesiology

## 2012-04-26 ENCOUNTER — Encounter: Payer: Medicaid Other | Admitting: Gynecology

## 2012-05-01 ENCOUNTER — Encounter: Payer: Medicaid Other | Admitting: Gynecology

## 2012-05-11 ENCOUNTER — Telehealth: Payer: Self-pay | Admitting: Family Medicine

## 2012-05-11 NOTE — Telephone Encounter (Signed)
Refill: Xanax 0.5 mg #90. One (tid) three times daily. New pharmacy

## 2012-05-11 NOTE — Telephone Encounter (Signed)
Please advise on RF request.//AB/CMA 

## 2012-05-13 NOTE — Telephone Encounter (Signed)
Ok for #90, no refills, needs to sign agreement and do UDS if not already done

## 2012-05-14 MED ORDER — ALPRAZOLAM 0.5 MG PO TABS: 0.5000 mg | ORAL_TABLET | Freq: Three times a day (TID) | ORAL | Status: DC | PRN

## 2012-05-14 NOTE — Telephone Encounter (Signed)
Spoke with the pt and informed her that the Rx request in ready, and she will need to pick it up this time so she can sign agreement.  Pt agreed.//AB/CMA

## 2012-05-15 ENCOUNTER — Telehealth: Payer: Self-pay | Admitting: Family Medicine

## 2012-05-15 NOTE — Telephone Encounter (Signed)
Refill: Clonazepam 1 mg tab. Take one tablet by mouth every night at bedtime as needed. Qty 30. Last fill 03-06-12

## 2012-05-17 MED ORDER — CLONAZEPAM 1 MG PO TABS: 1.0000 mg | ORAL_TABLET | Freq: Every evening | ORAL | Status: DC | PRN

## 2012-05-17 NOTE — Telephone Encounter (Signed)
Rx sent to the pharmacy by fax.//AB/CMA 

## 2012-05-17 NOTE — Telephone Encounter (Signed)
Last filled 11/09/11, last filled 01/09/12  **Controlled Substance Contract signed as of 05/14/12**

## 2012-05-17 NOTE — Telephone Encounter (Signed)
Ok for #30, 3 refills 

## 2012-05-22 HISTORY — PX: ABDOMINAL SURGERY: SHX537

## 2012-05-24 MED ORDER — OMEPRAZOLE 20 MG CAPSULE,DELAYED RELEASE
Freq: Every day | ORAL | 0 days
Start: 2012-05-24 — End: ?

## 2012-06-05 ENCOUNTER — Encounter: Payer: Medicaid Other | Admitting: Gynecology

## 2012-06-06 ENCOUNTER — Other Ambulatory Visit: Payer: Self-pay | Admitting: Family Medicine

## 2012-06-06 NOTE — Telephone Encounter (Signed)
Pt left msg on triage stating that she recently had Gastric Bypass surgery. She stated that she was not suppose to swallow any pills larger than an eraser. Pt said that Cymbalta is in capsule form & she's not sure if she should take these or try to break them. Please advise.

## 2012-06-06 NOTE — Telephone Encounter (Signed)
Left detailed msg on pt's vmail.  Refill done.

## 2012-06-06 NOTE — Telephone Encounter (Signed)
Phenergan refill --Last seen 01/09/12 and filled 04/06/12 #45. Please advise and see note below      KP

## 2012-06-06 NOTE — Telephone Encounter (Signed)
Ok for phenergan #45.  Can open cymbalta capsule and dissolve in applesauce, etc

## 2012-06-22 ENCOUNTER — Telehealth: Payer: Self-pay | Admitting: *Deleted

## 2012-06-22 MED ORDER — HYDROCODONE-ACETAMINOPHEN 5-325 MG PO TABS: 1.0000 | ORAL_TABLET | Freq: Four times a day (QID) | ORAL | Status: DC | PRN

## 2012-06-22 NOTE — Telephone Encounter (Signed)
[name] at [facility] called stated that the pt is taking Hydrocodone 5/500 and they will no longer be using the 5/500 and could we change her dose.  Informed [name] verbally that Dr. Beverely Low would like to change the pt's  Hydrocodone dose to 5/325 with the same direction #60 no refills.//AB/CMA

## 2012-06-30 ENCOUNTER — Other Ambulatory Visit: Payer: Self-pay

## 2012-07-03 ENCOUNTER — Encounter: Payer: Medicaid Other | Admitting: Gynecology

## 2012-07-12 ENCOUNTER — Encounter: Payer: Self-pay | Admitting: Family Medicine

## 2012-07-17 ENCOUNTER — Encounter: Payer: Medicaid Other | Admitting: Gynecology

## 2012-07-18 ENCOUNTER — Other Ambulatory Visit: Payer: Self-pay | Admitting: Family Medicine

## 2012-07-18 ENCOUNTER — Telehealth: Payer: Self-pay | Admitting: *Deleted

## 2012-07-18 NOTE — Telephone Encounter (Signed)
Called to make patient aware that cymbalta samples were available for pick up.

## 2012-07-23 NOTE — Telephone Encounter (Signed)
Please advise on RF request.  Last RF:06-20-12 and 06-21-12-No recent CPE.//AB/CMA

## 2012-07-25 ENCOUNTER — Ambulatory Visit (INDEPENDENT_AMBULATORY_CARE_PROVIDER_SITE_OTHER): Payer: BC Managed Care – PPO | Admitting: Gynecology

## 2012-07-25 ENCOUNTER — Encounter: Payer: Self-pay | Admitting: Gynecology

## 2012-07-25 VITALS — BP 118/80 | Ht 61.5 in | Wt 324.0 lb

## 2012-07-25 DIAGNOSIS — E663 Overweight: Secondary | ICD-10-CM

## 2012-07-25 DIAGNOSIS — Z01419 Encounter for gynecological examination (general) (routine) without abnormal findings: Secondary | ICD-10-CM

## 2012-07-25 NOTE — Patient Instructions (Addendum)
Transvaginal Ultrasound Transvaginal ultrasound is a pelvic ultrasound, using a metal probe that is placed in the vagina, to look at a women's female organs. Transvaginal ultrasound is a method of seeing inside the pelvis of a woman. The ultrasound machine sends out sound waves from the transducer (probe). These sound waves bounce off body structures (like an echo) to create a picture. The picture shows up on a monitor. It is called transvaginal because the probe is inserted into the vagina. There should be very little discomfort from the vaginal probe. This test can also be used during pregnancy. Endovaginal ultrasound is another name for a transvaginal ultrasound. In a transabdominal ultrasound, the probe is placed on the outside of the belly. This method gives pictures that are lower quality than pictures from the transvaginal technique. Transvaginal ultrasound is used to look for problems of the female genital tract. Some such problems include:  Infertility problems.  Congenital (birth defect) malformations of the uterus and ovaries.  Tumors in the uterus.  Abnormal bleeding.  Ovarian tumors and cysts.  Abscess (inflamed tissue around pus) in the pelvis.  Unexplained abdominal or pelvic pain.  Pelvic infection. DURING PREGNANCY, TRANSVAGINAL ULTRASOUND MAY BE USED TO LOOK AT:  Normal pregnancy.  Ectopic pregnancy (pregnancy outside the uterus).  Fetal heartbeat.  Abnormalities in the pelvis, that are not seen well with transabdominal ultrasound.  Suspected twins or multiples.  Impending miscarriage.  Problems with the cervix (incompetent cervix, not able to stay closed and hold the baby).  When doing an amniocentesis (removing fluid from the pregnancy sac, for testing).  Looking for abnormalities of the baby.  Checking the growth, development, and age of the fetus.  Measuring the amount of fluid in the amniotic sac.  When doing an external version of the baby (moving  baby into correct position).  Evaluating the baby for problems in high risk pregnancies (biophysical profile).  Suspected fetal demise (death). Sometimes a special ultrasound method called Saline Infusion Sonography (SIS) is used for a more accurate look at the uterus. Sterile saline (salt water) is injected into the uterus of non-pregnant patients to see the inside of the uterus better. SIS is not used on pregnant women. The vaginal probe can also assist in obtaining biopsies of abnormal areas, in draining fluid from cysts on the ovary, and in finding IUDs (intrauterine device, birth control) that cannot be located. PREPARATION FOR TEST A transvaginal ultrasound is done with the bladder empty. The transabdominal ultrasound is done with your bladder full. You may be asked to drink several glasses of water before that exam. Sometimes, a transabdominal ultrasound is done just after a transvaginal ultrasound, to look at organs in your abdomen. PROCEDURE  You will lie down on a table, with your knees bent and your feet in foot holders. The probe is covered with a condom. A sterile lubricant is put into the vagina and on the probe. The lubricant helps transmit the sound waves and avoid irritating the vagina. Your caregiver will move the probe inside the vaginal cavity to scan the pelvic structures. A normal test will show a normal pelvis and normal contents. An abnormal test will show abnormalities of the pelvis, placenta, or baby. ABNORMAL RESULTS MAY BE DUE TO:  Growths or tumors in the:  Uterus.  Ovaries.  Vagina.  Other pelvic structures.  Non-cancerous growths of the uterus and ovaries.  Twisting of the ovary, cutting off blood supply to the ovary (ovarian torsion).  Areas of infection, including:  Pelvic  inflammatory disease.  Abscess in the pelvis.  Locating an IUD. PROBLEMS FOUND IN PREGNANT WOMEN MAY INCLUDE:  Ectopic pregnancy (pregnancy outside the uterus).  Multiple  pregnancies.  Early dilation (opening) of the cervix. This may indicate an incompetent cervix and early delivery.  Impending miscarriage.  Fetal death.  Problems with the placenta, including:  Placenta has grown over the opening of the womb (placenta previa).  Placenta has separated early in the womb (placental abruption).  Placenta grows into the muscle of the uterus (placenta accreta).  Tumors of pregnancy, including gestational trophoblastic disease. This is an abnormal pregnancy, with no fetus. The uterus is filled with many grape-like cysts that could sometimes be cancerous.  Incorrect position of the fetus (breech, vertex).  Intrauterine fetal growth retardation (IUGR) (poor growth in the womb).  Fetal abnormalities or infection. RISKS AND COMPLICATIONS There are no known risks to the ultrasound procedure. There is no X-ray used when doing an ultrasound. Document Released: 04/13/2004 Document Revised: 07/25/2011 Document Reviewed: 04/01/2009 [facility] Patient Information 2013 [place], [place].

## 2012-07-25 NOTE — Progress Notes (Signed)
[name] 1971/03/29 [id number]   History:    42 y.o.  for annual gyn exam who has not been seen the office since 2011. Patient who is morbidly obese recently underwent a gastric bypass in January of this year in [place] and has lost 50 pounds since then. She is very happy and positive about her life outlook. She is also been followed by Dr. Beverely Low who has been treating her for hyperlipidemia, hypertension, and diabetes.  Patient with prior history of total abdominal hysterectomy. Patient's last mammogram was in 2013 was normal. Patient would know prior history of abnormal Pap smears. Patient frequently does her monthly breast exams.  Past medical history,surgical history, family history and social history were all reviewed and documented in the EPIC chart.  Gynecologic History No LMP recorded. Patient has had a hysterectomy. Contraception: status post hysterectomy Last Pap: 2009. Results were: normal Last mammogram: 2013. Results were: normal  Obstetric History OB History   Grav Para Term Preterm Abortions TAB SAB Ect Mult Living   1 1        1      # Outc Date GA Lbr Len/2nd Wgt Sex Del Anes PTL Lv   1 PAR                ROS: A ROS was performed and pertinent positives and negatives are included in the history.  GENERAL: No fevers or chills. HEENT: No change in vision, no earache, sore throat or sinus congestion. NECK: No pain or stiffness. CARDIOVASCULAR: No chest pain or pressure. No palpitations. PULMONARY: No shortness of breath, cough or wheeze. GASTROINTESTINAL: No abdominal pain, nausea, vomiting or diarrhea, melena or bright red blood per rectum. GENITOURINARY: No urinary frequency, urgency, hesitancy or dysuria. MUSCULOSKELETAL: No joint or muscle pain, no back pain, no recent trauma. DERMATOLOGIC: No rash, no itching, no lesions. ENDOCRINE: No polyuria, polydipsia, no heat or cold intolerance. No recent change in weight. HEMATOLOGICAL: No anemia or easy  bruising or bleeding. NEUROLOGIC: No headache, seizures, numbness, tingling or weakness. PSYCHIATRIC: No depression, no loss of interest in normal activity or change in sleep pattern.     Exam: chaperone present  BP 118/80  Ht 5' 1.5" (1.562 m)  Wt 324 lb (146.965 kg)  BMI 60.24 kg/m2  Body mass index is 60.24 kg/(m^2).  General appearance : Well developed well nourished female. No acute distress HEENT: Neck supple, trachea midline, no carotid bruits, no thyroidmegaly Lungs: Clear to auscultation, no rhonchi or wheezes, or rib retractions  Heart: Regular rate and rhythm, no murmurs or gallops Breast:Examined in sitting and supine position were symmetrical in appearance, no palpable masses or tenderness,  no skin retraction, no nipple inversion, no nipple discharge, no skin discoloration, no axillary or supraclavicular lymphadenopathy Abdomen: no palpable masses or tenderness, no rebound or guarding Extremities: no edema or skin discoloration or tenderness  Pelvic:  Bartholin, Urethra, Skene Glands: Within normal limits             Vagina: No gross lesions or discharge  Cervix: absent  Uterus  Absent  Adnexa  Limited due to abdominal girth             Anus and perineum  normal   Rectovaginal  normal sphincter tone without palpated masses or tenderness             Hemoccult Not indicated     Assessment/Plan:  42 y.o. female for annual exam who is doing well since her recent gastric  bypass and has lost 50 pounds. Her primary physician is doing her lab work so no labs were drawn today. No Pap smear done today. New Pap smear screening guidelines discussed. Due to patient's weight an abdominal girth and incomplete pelvic exam she will undergo a transvaginal ultrasound next week for better assessment of her adnexa. She was congratulated on her weight reduction efforts and her continue exercise and diet after her surgery.    Ok Edwards MD, 11:55 AM 07/25/2012

## 2012-07-27 ENCOUNTER — Encounter: Payer: Self-pay | Admitting: Gynecology

## 2012-08-10 ENCOUNTER — Other Ambulatory Visit: Payer: BC Managed Care – PPO

## 2012-08-10 ENCOUNTER — Ambulatory Visit: Payer: BC Managed Care – PPO | Admitting: Gynecology

## 2012-08-13 ENCOUNTER — Other Ambulatory Visit: Payer: Self-pay | Admitting: Family Medicine

## 2012-08-13 ENCOUNTER — Ambulatory Visit: Payer: BC Managed Care – PPO | Admitting: Family Medicine

## 2012-08-15 NOTE — Telephone Encounter (Signed)
Last ZO:[id number] 5-325mg :07-24-12,Promethazine 25mg :04-06-12, last OV:11-30-11.  Pt is scheduled for an appt:08-27-12.  Please advise.//AB/CMA

## 2012-08-17 ENCOUNTER — Telehealth: Payer: Self-pay | Admitting: Family Medicine

## 2012-08-17 MED ORDER — CYANOCOBALAMIN 1000 MCG/ML IJ KIT: 1.0000 mL | PACK | INTRAMUSCULAR | Status: DC

## 2012-08-17 MED ORDER — SYRINGE (DISPOSABLE) 3 ML MISC: Status: DC

## 2012-08-17 NOTE — Telephone Encounter (Signed)
RX sent in for syringes and B12 electronically

## 2012-08-17 NOTE — Telephone Encounter (Signed)
Ok for 12 month supply- 1 injxn monthly- to include syringes.  Please call in order or send script

## 2012-08-17 NOTE — Telephone Encounter (Signed)
Patient calling wanting B12 injection, but we are still out.  Patient called [facility] in [place], [place], and they will give her 12 month supply for $45.  They just need a prescription stating 12 month supply and if you want to include syringes.  Pharmacy phone number is [phone].

## 2012-08-24 ENCOUNTER — Ambulatory Visit: Payer: Self-pay | Admitting: Gynecology

## 2012-08-24 ENCOUNTER — Other Ambulatory Visit: Payer: Self-pay

## 2012-08-27 ENCOUNTER — Ambulatory Visit (INDEPENDENT_AMBULATORY_CARE_PROVIDER_SITE_OTHER): Payer: BC Managed Care – PPO | Admitting: Family Medicine

## 2012-08-27 ENCOUNTER — Encounter: Payer: Self-pay | Admitting: Family Medicine

## 2012-08-27 ENCOUNTER — Other Ambulatory Visit: Payer: Self-pay | Admitting: Family Medicine

## 2012-08-27 VITALS — BP 100/70 | HR 66 | Temp 98.0°F | Ht 62.5 in | Wt 310.0 lb

## 2012-08-27 DIAGNOSIS — E538 Deficiency of other specified B group vitamins: Secondary | ICD-10-CM

## 2012-08-27 DIAGNOSIS — E785 Hyperlipidemia, unspecified: Secondary | ICD-10-CM

## 2012-08-27 DIAGNOSIS — I10 Essential (primary) hypertension: Secondary | ICD-10-CM

## 2012-08-27 DIAGNOSIS — E669 Obesity, unspecified: Secondary | ICD-10-CM

## 2012-08-27 LAB — HEPATIC FUNCTION PANEL
ALT: 24 U/L (ref 0–35)
AST: 25 U/L (ref 0–37)
Alkaline Phosphatase: 59 U/L (ref 39–117)
Bilirubin, Direct: 0.1 mg/dL (ref 0.0–0.3)
Total Protein: 7 g/dL (ref 6.0–8.3)

## 2012-08-27 LAB — LIPID PANEL
HDL: 22.8 mg/dL — ABNORMAL LOW (ref >39.00)
Total CHOL/HDL Ratio: 8

## 2012-08-27 LAB — BASIC METABOLIC PANEL
CO2: 28 mEq/L (ref 19–32)
Calcium: 9 mg/dL (ref 8.4–10.5)
Chloride: 101 mEq/L (ref 96–112)
Glucose, Bld: 79 mg/dL (ref 70–99)
Potassium: 4 mEq/L (ref 3.5–5.1)
Sodium: 135 mEq/L (ref 135–145)

## 2012-08-27 MED ORDER — CYANOCOBALAMIN 1000 MCG/ML IJ SOLN
1000.0000 ug | Freq: Once | INTRAMUSCULAR | Status: AC
  Administered 2012-08-27: 1000 ug via INTRAMUSCULAR

## 2012-08-27 NOTE — Patient Instructions (Addendum)
Schedule your complete physical in 6 months We'll notify you of your lab results and make any changes if needed Keep up the good work!  You're doing great! Call with any questions or concerns Happy [name]!!!

## 2012-08-27 NOTE — Progress Notes (Signed)
  Subjective:    Patient ID: [name], female    DOB: July 03, 1970, 42 y.o.   MRN: [id number]  HPI Hyperlipidemia- chronic problem, on Fenofibrate and Crestor.  Has lost 60 lbs.  Denies abd pain, N/V.  + myalgias (fibromyaglia).  HTN- chronic problem, on Bystolic.  No CP, SOB, HAs, visual changes, edema.  Obesity- pt had gastric bypass on 1/7 at [facility].  Has lost 60 lbs since last visit in August.  Doing some activity.  Very compliant w/ diet- in nutrition classes regularly.   Review of Systems For ROS see HPI     Objective:   Physical Exam  Vitals reviewed. Constitutional: She is oriented to person, place, and time. She appears well-developed and well-nourished. No distress.  HENT:  Head: Normocephalic and atraumatic.  Eyes: Conjunctivae and EOM are normal. Pupils are equal, round, and reactive to light.  Neck: Normal range of motion. Neck supple. No thyromegaly present.  Cardiovascular: Normal rate, regular rhythm, normal heart sounds and intact distal pulses.   No murmur heard. Pulmonary/Chest: Effort normal and breath sounds normal. No respiratory distress.  Abdominal: Soft. She exhibits no distension. There is no tenderness.  Musculoskeletal: She exhibits no edema.  Lymphadenopathy:    She has no cervical adenopathy.  Neurological: She is alert and oriented to person, place, and time.  Skin: Skin is warm and dry.  Psychiatric: She has a normal mood and affect. Her behavior is normal.          Assessment & Plan:

## 2012-08-29 MED ORDER — ALPRAZOLAM 0.5 MG PO TABS: ORAL_TABLET | ORAL | Status: DC

## 2012-08-29 NOTE — Telephone Encounter (Signed)
Last refill:07-23-12,last OV:08-27-12.  Please advise.//AB/CMA

## 2012-09-03 ENCOUNTER — Telehealth: Payer: Self-pay | Admitting: *Deleted

## 2012-09-03 NOTE — Telephone Encounter (Signed)
Ok for #45, no refills 

## 2012-09-03 NOTE — Assessment & Plan Note (Signed)
Chronic problem.  Well controlled on current meds.  Asymptomatic.  No changes. 

## 2012-09-03 NOTE — Telephone Encounter (Signed)
Last refill:01-13-12 Last OV:08-27-12 Please advise.//AB/CMA

## 2012-09-03 NOTE — Assessment & Plan Note (Signed)
Chronic problem.  Tolerating meds w/out difficulty.  Check labs.  Adjust meds prn  

## 2012-09-03 NOTE — Assessment & Plan Note (Signed)
Pt has lost 60 lbs since August.  Still not exercising but being very compliant w/ diet.  Will continue to follow.

## 2012-09-04 MED ORDER — CYCLOBENZAPRINE HCL 10 MG PO TABS: 10.0000 mg | ORAL_TABLET | Freq: Three times a day (TID) | ORAL | Status: DC | PRN

## 2012-09-04 NOTE — Telephone Encounter (Signed)
Rx sent to the pharmacy by e-script.//AB/CMA 

## 2012-09-10 ENCOUNTER — Other Ambulatory Visit: Payer: Self-pay | Admitting: Gynecology

## 2012-09-10 ENCOUNTER — Other Ambulatory Visit: Payer: Self-pay | Admitting: Family Medicine

## 2012-09-10 DIAGNOSIS — N83209 Unspecified ovarian cyst, unspecified side: Secondary | ICD-10-CM

## 2012-09-10 NOTE — Telephone Encounter (Signed)
Ok to change to 7.5/325mg , 1 tab TID prn.  #60, no refills

## 2012-09-10 NOTE — Telephone Encounter (Signed)
Please advise.//AB/CMA 

## 2012-09-10 NOTE — Telephone Encounter (Signed)
Last seen 08/27/12 and filled 08/13/12 #60. Please advise     KP

## 2012-09-11 ENCOUNTER — Encounter: Payer: Self-pay | Admitting: Family Medicine

## 2012-09-13 ENCOUNTER — Other Ambulatory Visit: Payer: Self-pay | Admitting: General Practice

## 2012-09-13 MED ORDER — HYDROCODONE-ACETAMINOPHEN 7.5-325 MG PO TABS: 1.0000 | ORAL_TABLET | Freq: Three times a day (TID) | ORAL | Status: DC | PRN

## 2012-09-14 ENCOUNTER — Other Ambulatory Visit: Payer: Self-pay

## 2012-09-14 ENCOUNTER — Ambulatory Visit: Payer: Self-pay | Admitting: Gynecology

## 2012-09-20 ENCOUNTER — Telehealth: Payer: Self-pay | Admitting: *Deleted

## 2012-09-20 NOTE — Telephone Encounter (Signed)
Called and spoke with the pt and informed her that her samples for Abilify are here and she came by and pick them up.//AB/CMA

## 2012-09-25 ENCOUNTER — Ambulatory Visit (INDEPENDENT_AMBULATORY_CARE_PROVIDER_SITE_OTHER): Payer: BC Managed Care – PPO | Admitting: Family Medicine

## 2012-09-25 ENCOUNTER — Encounter: Payer: Self-pay | Admitting: Family Medicine

## 2012-09-25 VITALS — BP 104/80 | HR 71 | Temp 98.1°F | Ht 62.5 in | Wt 304.2 lb

## 2012-09-25 DIAGNOSIS — B354 Tinea corporis: Secondary | ICD-10-CM

## 2012-09-25 MED ORDER — CLOTRIMAZOLE-BETAMETHASONE 1-0.05 % EX CREA: TOPICAL_CREAM | Freq: Two times a day (BID) | CUTANEOUS | Status: DC

## 2012-09-25 NOTE — Progress Notes (Signed)
  Subjective:    Patient ID: [name], female    DOB: June 17, 1970, 42 y.o.   MRN: [id number]  HPI Rash- first noticed a couple of weeks ago on L hand.  Not itchy but uncomfortable to apply pressure.  Since has developed areas on R hand and now spreading up forearms.  No one w/ similar sxs.  Has recently been petting animals.   Review of Systems For ROS see HPI     Objective:   Physical Exam  Vitals reviewed. Constitutional: She appears well-developed and well-nourished. No distress.  Skin: Skin is warm and dry. Rash (erythematous slightly raised papules consistent w/ ring worm on hands and arms bilaterally) noted.          Assessment & Plan:

## 2012-09-25 NOTE — Patient Instructions (Addendum)
Start the Lotrisone ointment twice daily This appears to be ringworm Call with any questions or concerns Keep up the good work on the weight loss!  You're doing great!

## 2012-09-25 NOTE — Assessment & Plan Note (Signed)
New.  Reviewed dx.  Start topical antifungal/steroid combo.  Reviewed supportive care and red flags that should prompt return.  Pt expressed understanding and is in agreement w/ plan.

## 2012-09-26 ENCOUNTER — Telehealth: Payer: Self-pay | Admitting: *Deleted

## 2012-09-26 NOTE — Telephone Encounter (Signed)
Called and LM @ (4:39pm) informed the pt that her samples for the Trilipix 135mg  has come in and she can pick them up at the convenience.  I will leave the samples up front to be picked up.//AB/CMA

## 2012-10-05 ENCOUNTER — Other Ambulatory Visit: Payer: Self-pay

## 2012-10-05 ENCOUNTER — Ambulatory Visit: Payer: Self-pay | Admitting: Gynecology

## 2012-10-15 ENCOUNTER — Other Ambulatory Visit: Payer: Self-pay | Admitting: Family Medicine

## 2012-10-15 NOTE — Telephone Encounter (Signed)
Last OV 09-25-12, last filled 09-04-12 #45

## 2012-10-18 ENCOUNTER — Telehealth: Payer: Self-pay | Admitting: Family Medicine

## 2012-10-18 NOTE — Telephone Encounter (Signed)
HYDROcodone-acetaminophen (NORCO) 7.5-325 MG per tablet Pt called in and would like refill called into [facility]

## 2012-10-19 MED ORDER — HYDROCODONE-ACETAMINOPHEN 7.5-325 MG PO TABS: 1.0000 | ORAL_TABLET | Freq: Three times a day (TID) | ORAL | Status: DC | PRN

## 2012-10-19 NOTE — Telephone Encounter (Signed)
Med filled.  

## 2012-10-19 NOTE — Telephone Encounter (Signed)
Ok for #60, no refills 

## 2012-10-19 NOTE — Telephone Encounter (Signed)
Last OV 09-25-2012 Med filled on 09-13-12 #60 0 refills

## 2012-10-26 ENCOUNTER — Telehealth: Payer: Self-pay | Admitting: Family Medicine

## 2012-10-26 NOTE — Telephone Encounter (Signed)
lotrisone should have helped based on Dr T note-----she can be seen in sat clinic or wait until Monday to see Dr Beverely Low

## 2012-10-26 NOTE — Telephone Encounter (Signed)
Patient states the rash is not getting better with the cream and thinks she may need an antibiotic. Pt uses [facility] on [address] [place]

## 2012-10-26 NOTE — Telephone Encounter (Signed)
msg left to call the office     KP 

## 2012-10-29 ENCOUNTER — Ambulatory Visit: Payer: BC Managed Care – PPO | Admitting: Family Medicine

## 2012-10-31 ENCOUNTER — Ambulatory Visit: Payer: BC Managed Care – PPO

## 2012-10-31 ENCOUNTER — Encounter: Payer: Self-pay | Admitting: Family Medicine

## 2012-10-31 ENCOUNTER — Ambulatory Visit (INDEPENDENT_AMBULATORY_CARE_PROVIDER_SITE_OTHER): Payer: BC Managed Care – PPO | Admitting: Family Medicine

## 2012-10-31 VITALS — BP 108/80 | HR 60 | Temp 98.4°F | Ht 62.5 in | Wt 295.0 lb

## 2012-10-31 DIAGNOSIS — L0390 Cellulitis, unspecified: Secondary | ICD-10-CM

## 2012-10-31 DIAGNOSIS — M79609 Pain in unspecified limb: Secondary | ICD-10-CM

## 2012-10-31 DIAGNOSIS — L0291 Cutaneous abscess, unspecified: Secondary | ICD-10-CM

## 2012-10-31 DIAGNOSIS — Z9889 Other specified postprocedural states: Secondary | ICD-10-CM | POA: Insufficient documentation

## 2012-10-31 DIAGNOSIS — Z9884 Bariatric surgery status: Secondary | ICD-10-CM | POA: Insufficient documentation

## 2012-10-31 DIAGNOSIS — B354 Tinea corporis: Secondary | ICD-10-CM

## 2012-10-31 LAB — HEPATIC FUNCTION PANEL: Albumin: 4 g/dL (ref 3.5–5.2)

## 2012-10-31 LAB — BASIC METABOLIC PANEL
BUN: 9 mg/dL (ref 6–23)
CO2: 23 mEq/L (ref 19–32)
Chloride: 100 mEq/L (ref 96–112)
Creatinine, Ser: 0.8 mg/dL (ref 0.4–1.2)
Glucose, Bld: 84 mg/dL (ref 70–99)

## 2012-10-31 LAB — CBC WITH DIFFERENTIAL/PLATELET
Basophils Relative: 0.6 % (ref 0.0–3.0)
Eosinophils Relative: 3.9 % (ref 0.0–5.0)
Hemoglobin: 13.2 g/dL (ref 12.0–15.0)
Lymphocytes Relative: 32.8 % (ref 12.0–46.0)
MCHC: 32.5 g/dL (ref 30.0–36.0)
Monocytes Relative: 6.7 % (ref 3.0–12.0)
Neutro Abs: 3.9 10*3/uL (ref 1.4–7.7)
RBC: 4.53 Mil/uL (ref 3.87–5.11)

## 2012-10-31 MED ORDER — NAFTIFINE HCL 2 % EX CREA: 1.0000 "application " | TOPICAL_CREAM | Freq: Every day | CUTANEOUS | Status: DC

## 2012-10-31 MED ORDER — DICLOFENAC SODIUM 1 % TD GEL: 2.0000 g | Freq: Four times a day (QID) | TRANSDERMAL | Status: DC

## 2012-10-31 MED ORDER — AMOXICILLIN 400 MG/5ML PO SUSR: ORAL | Status: DC

## 2012-10-31 MED ORDER — CYANOCOBALAMIN 1000 MCG/ML IJ SOLN
1000.0000 ug | Freq: Once | INTRAMUSCULAR | Status: AC
  Administered 2012-10-31: 1000 ug via INTRAMUSCULAR

## 2012-10-31 NOTE — Assessment & Plan Note (Signed)
New.  At site of bug bite on R lateral neck.  Start amox as pt has multiple abx allergies.  Will follow.

## 2012-10-31 NOTE — Addendum Note (Signed)
Addended by: Verdie Shire on: 10/31/2012 02:32 PM   Modules accepted: Orders

## 2012-10-31 NOTE — Assessment & Plan Note (Signed)
New.  Suspect pt w/ plantar fasciitis on R and when she alters walk to accommodate this, she develops pain on L.  Pt to ice, use voltaren gel (NSAID intolerant due to bariatric surgery), and pt to wear supportive, cushioned shoes.  Will follow.

## 2012-10-31 NOTE — Assessment & Plan Note (Signed)
Unchanged.  Stop Lotrisone cream and switch to Naftin.  If no improvement, may need derm referral.  Pt expressed understanding and is in agreement w/ plan.

## 2012-10-31 NOTE — Assessment & Plan Note (Signed)
New.  Labs collected at request of surgeon.

## 2012-10-31 NOTE — Progress Notes (Signed)
  Subjective:    Patient ID: [name], female    DOB: 05/04/1971, 42 y.o.   MRN: [id number]  HPI L hand tinea- has been using Lotrisone 2-3x/day w/out improvement.  Now on R hand as well.  Itching.  No pain.  No drainage.  Insect bite- R side of neck, red, swollen.  Now LN's enlarged.  No fevers.    Leg pain- pt reports pain in R heel and L lateral ankle.  'feels like bruising but there are no bruises'.  'it's like someone's breaking my leg'  No injury.  Hx of fibromyalgia.  1st step in AM and after prolonged sitting is most painful.   Review of Systems For ROS see HPI     Objective:   Physical Exam  Vitals reviewed. Constitutional: She appears well-developed and well-nourished. No distress.  Musculoskeletal: She exhibits no edema.  + TTP over insertion of plantar fascia on R  Skin: Skin is warm and dry. Rash (circular and semicircular areas on dorsum of hands bilaterally consistent w/ tinea) noted. There is erythema (on R lateral neck surrounding central bite site w/out purulence or fluctuance).          Assessment & Plan:

## 2012-10-31 NOTE — Patient Instructions (Addendum)
Start the Naftin daily for the ringworm Take the Amox twice daily for the cellulitis ICE and stretch the foot Use the Voltaren gel for pain relief Wear good, supportive shoes We'll notify you of your lab results Hang in there!

## 2012-11-01 LAB — IBC PANEL: Iron: 81 ug/dL (ref 42–145)

## 2012-11-01 LAB — VITAMIN B1

## 2012-11-01 LAB — VITAMIN D 25 HYDROXY (VIT D DEFICIENCY, FRACTURES): Vit D, 25-Hydroxy: 39 ng/mL (ref 30–89)

## 2012-11-09 ENCOUNTER — Other Ambulatory Visit: Payer: Self-pay | Admitting: Gynecology

## 2012-11-09 ENCOUNTER — Ambulatory Visit (INDEPENDENT_AMBULATORY_CARE_PROVIDER_SITE_OTHER): Payer: BC Managed Care – PPO

## 2012-11-09 ENCOUNTER — Ambulatory Visit (INDEPENDENT_AMBULATORY_CARE_PROVIDER_SITE_OTHER): Payer: BC Managed Care – PPO | Admitting: Gynecology

## 2012-11-09 DIAGNOSIS — N83209 Unspecified ovarian cyst, unspecified side: Secondary | ICD-10-CM

## 2012-11-09 NOTE — Progress Notes (Signed)
The patient is a 42 year old who presented to the office for an ultrasound due the fact that she is morbidly obese and at time of her annual exam we were unable to assess her ovaries. She underwent gastric bypass in January of this year in [place] and has lost 50 pounds. Patient has a prior history of total abdominal hysterectomy. Patient is asymptomatic today.  Ultrasound today: Vaginal cuff appears normal. Ovaries appear normal. No fluid seen in the cul-de-sac.  Patient was reassured and was encouraged to continue doing monthly self breast examination we will see her back in one year or when necessary.

## 2012-11-12 ENCOUNTER — Telehealth: Payer: Self-pay | Admitting: *Deleted

## 2012-11-12 DIAGNOSIS — Z9884 Bariatric surgery status: Secondary | ICD-10-CM

## 2012-11-12 NOTE — Telephone Encounter (Signed)
Called and spoke with the pt regarding scheduling an lab appt to have a Vit B1 drawn.  Pt agreed and appt made for Tues 11-20-12 @ 1:15pm.  Lab ordered and sent.//AB/CMA

## 2012-11-13 ENCOUNTER — Encounter: Payer: Self-pay | Admitting: Family Medicine

## 2012-11-13 ENCOUNTER — Other Ambulatory Visit: Payer: Self-pay | Admitting: Family Medicine

## 2012-11-13 MED ORDER — HYDROCODONE-ACETAMINOPHEN 7.5-325 MG PO TABS: 1.0000 | ORAL_TABLET | Freq: Three times a day (TID) | ORAL | Status: DC | PRN

## 2012-11-13 NOTE — Telephone Encounter (Signed)
Ok to refill? Last OV 6.18.14 Last filled 6.6.14

## 2012-11-19 ENCOUNTER — Telehealth: Payer: Self-pay

## 2012-11-19 NOTE — Telephone Encounter (Signed)
Patient has samples to be picked up. Trilipix 1.5mg  #90. LMOVM to pick up at front desk.Marland Kitchen

## 2012-11-20 ENCOUNTER — Other Ambulatory Visit: Payer: BC Managed Care – PPO

## 2012-11-26 ENCOUNTER — Other Ambulatory Visit: Payer: Self-pay | Admitting: Family Medicine

## 2012-11-26 NOTE — Telephone Encounter (Signed)
Last seen 10/31/12 and filled 08/29/12 #90 by Dr.Lowne. Please advise     KP

## 2012-11-27 ENCOUNTER — Other Ambulatory Visit (INDEPENDENT_AMBULATORY_CARE_PROVIDER_SITE_OTHER): Payer: BC Managed Care – PPO

## 2012-11-27 DIAGNOSIS — Z9884 Bariatric surgery status: Secondary | ICD-10-CM

## 2012-11-27 NOTE — Telephone Encounter (Signed)
Rx printed and pt will pick up the rx.//AB/CMA

## 2012-11-27 NOTE — Progress Notes (Signed)
LABS ONLY  

## 2012-11-30 LAB — VITAMIN B1: Vitamin B1 (Thiamine): 38 nmol/L — ABNORMAL HIGH (ref 8–30)

## 2012-12-05 ENCOUNTER — Other Ambulatory Visit: Payer: Self-pay | Admitting: Family Medicine

## 2012-12-06 ENCOUNTER — Other Ambulatory Visit: Payer: Self-pay | Admitting: Family Medicine

## 2012-12-06 NOTE — Telephone Encounter (Signed)
Last seen 10/31/12 and filled 10/15/12 #45. Please advise    KP

## 2012-12-06 NOTE — Telephone Encounter (Signed)
Last seen 10/31/12 and filled 11/13/12 #60. Please advise      KP

## 2012-12-11 ENCOUNTER — Encounter: Payer: Self-pay | Admitting: Family Medicine

## 2012-12-11 ENCOUNTER — Ambulatory Visit (HOSPITAL_BASED_OUTPATIENT_CLINIC_OR_DEPARTMENT_OTHER)
Admission: RE | Admit: 2012-12-11 | Discharge: 2012-12-11 | Disposition: A | Discharge status: Home / Self Care | Payer: BC Managed Care – PPO | Source: Ambulatory Visit | Attending: Family Medicine | Admitting: Family Medicine

## 2012-12-11 ENCOUNTER — Ambulatory Visit (INDEPENDENT_AMBULATORY_CARE_PROVIDER_SITE_OTHER): Payer: BC Managed Care – PPO | Admitting: Family Medicine

## 2012-12-11 VITALS — BP 100/70 | HR 63 | Temp 98.3°F | Ht 62.5 in | Wt 293.8 lb

## 2012-12-11 DIAGNOSIS — R609 Edema, unspecified: Secondary | ICD-10-CM | POA: Insufficient documentation

## 2012-12-11 DIAGNOSIS — M79609 Pain in unspecified limb: Secondary | ICD-10-CM

## 2012-12-11 DIAGNOSIS — M79606 Pain in leg, unspecified: Secondary | ICD-10-CM | POA: Insufficient documentation

## 2012-12-11 DIAGNOSIS — M79605 Pain in left leg: Secondary | ICD-10-CM

## 2012-12-11 DIAGNOSIS — L539 Erythematous condition, unspecified: Secondary | ICD-10-CM | POA: Insufficient documentation

## 2012-12-11 MED ORDER — AMOXICILLIN 400 MG/5ML PO SUSR: ORAL | Status: AC

## 2012-12-11 MED ORDER — PROMETHAZINE HCL 25 MG PO TABS: ORAL_TABLET | ORAL | Status: DC

## 2012-12-11 NOTE — Progress Notes (Signed)
  Subjective:    Patient ID: [name], female    DOB: 30-Nov-1970, 42 y.o.   MRN: [id number]  HPI L leg pain- pt reports leg will swell and there's a knot present laterally, just above malleolus.  Pt reports leg will get red and hot.  sxs x3 weeks.  Feels that it may be infected.  No recent travel or period of immobility.  No CP, SOB.   Review of Systems     Objective:   Physical Exam  Vitals reviewed. Constitutional: She is oriented to person, place, and time. She appears well-developed and well-nourished. No distress.  obese  Cardiovascular: Intact distal pulses.   Musculoskeletal: She exhibits tenderness (superficial tenderness, induration, and mild erythema of L lower leg laterally- just superiour to malleolus). She exhibits no edema.  Neurological: She is alert and oriented to person, place, and time.          Assessment & Plan:

## 2012-12-11 NOTE — Assessment & Plan Note (Signed)
New.  Start abx to treat possible cellulitis.  Get [place] to r/o DVT or superficial thrombophlebitis.  Reviewed supportive care and red flags that should prompt return.  Pt expressed understanding and is in agreement w/ plan.

## 2012-12-11 NOTE — Patient Instructions (Addendum)
We'll notify you of your ultrasound results Start the Amoxicillin twice daily for infection Apply heat to the area that hurts Call with any questions or concerns Hang in there!!

## 2012-12-26 ENCOUNTER — Telehealth: Payer: Self-pay | Admitting: Family Medicine

## 2012-12-26 NOTE — Telephone Encounter (Signed)
Patient Information:  Caller Name: Shazia  Phone: [phone]  Patient: [name], [name]  Gender: Female  DOB: [birth year]  Age: 42 Years  PCP: Sheliah Hatch  Pregnant: No  Office Follow Up:  Does the office need to follow up with this patient?: No  Instructions For The Office: N/A  RN Note:  Amoxicillin finished Saturday, 12/22/2012 for left leg cellulitis (about 1/2 above ankle on the outside) and still has edema, red/hot (not increased) and pain still present. Amoxicillin did not effect at all per Inetta Fermo. Last time she had cellulitis she states it took 6 antibiotics to treat. She can only stand on her left leg for basics such as going to the bathroom related to the pain, RN/CAN reminded about "FAll precautions" and advised being seen in the office. RN/CAN transferred to "[place]" at the office who scheduled for her primary Dr. Beverely Low, tomorrow, 12/27/2012 @ 9:15.  Symptoms  Reason For Call & Symptoms: OV for cellulitis and antibiotics done and still present and questioning if this is the same as last time and needed another antibiotic  Reviewed Health History In EMR: Yes  Reviewed Medications In EMR: Yes  Reviewed Allergies In EMR: Yes  Reviewed Surgeries / Procedures: Yes  Date of Onset of Symptoms: 12/26/2012  Treatments Tried: Amoxicillin for 10 days.  Treatments Tried Worked: No OB / GYN:  LMP: Unknown  Guideline(s) Used:  Leg Pain  Leg Swelling and Edema  Disposition Per Guideline:   See Today in Office  Reason For Disposition Reached:   Patient wants to be seen  Advice Given:  Call Back If:  Swelling becomes worse  Swelling becomes red or painful to the touch  Calf pain occurs and becomes constant  You become worse.  Patient Will Follow Care Advice:  YES

## 2012-12-27 ENCOUNTER — Ambulatory Visit: Payer: BC Managed Care – PPO | Admitting: Family Medicine

## 2012-12-28 ENCOUNTER — Encounter: Payer: Self-pay | Admitting: Family Medicine

## 2012-12-28 ENCOUNTER — Ambulatory Visit (INDEPENDENT_AMBULATORY_CARE_PROVIDER_SITE_OTHER): Payer: BC Managed Care – PPO | Admitting: Family Medicine

## 2012-12-28 VITALS — BP 130/82 | HR 81 | Temp 98.1°F | Ht 62.5 in | Wt 294.6 lb

## 2012-12-28 DIAGNOSIS — M79605 Pain in left leg: Secondary | ICD-10-CM

## 2012-12-28 DIAGNOSIS — M79609 Pain in unspecified limb: Secondary | ICD-10-CM

## 2012-12-28 DIAGNOSIS — F411 Generalized anxiety disorder: Secondary | ICD-10-CM

## 2012-12-28 MED ORDER — CLONAZEPAM 1 MG PO TABS: 1.0000 mg | ORAL_TABLET | Freq: Every evening | ORAL | Status: DC | PRN

## 2012-12-28 MED ORDER — LEVOFLOXACIN 500 MG PO TABS: 500.0000 mg | ORAL_TABLET | Freq: Every day | ORAL | Status: DC

## 2012-12-28 MED ORDER — GABAPENTIN 600 MG PO TABS: 600.0000 mg | ORAL_TABLET | Freq: Three times a day (TID) | ORAL | Status: DC

## 2012-12-28 MED ORDER — HYDROCODONE-ACETAMINOPHEN 7.5-325 MG PO TABS: ORAL_TABLET | ORAL | Status: DC

## 2012-12-28 NOTE — Assessment & Plan Note (Signed)
Refill provided on Klonopin.

## 2012-12-28 NOTE — Assessment & Plan Note (Signed)
Unchanged.  Continued cellulitis of L lower leg laterally.  Start Levaquin as it doesn't appear to be MRSA and pt has multiple drug allergies.  Reviewed supportive care and red flags that should prompt return.  Pt expressed understanding and is in agreement w/ plan.

## 2012-12-28 NOTE — Patient Instructions (Addendum)
Start the Levaquin daily- take w/ food ICE!! Call with any questions or concerns Hang in there!!!

## 2012-12-28 NOTE — Progress Notes (Signed)
  Subjective:    Patient ID: [name], female    DOB: 1971-03-10, 42 y.o.   MRN: [id number]  HPI Cellulitis- pt was treated w/ course of Amox.  Redness and swelling has not improved.  Still very painful.  No pus or drainage.   Review of Systems For ROS see HPI     Objective:   Physical Exam  Vitals reviewed. Constitutional: She appears well-developed and well-nourished. No distress.  Cardiovascular: Intact distal pulses.   Musculoskeletal: She exhibits edema and tenderness (TTP over area of erythema and induration of L lower leg).  Skin: There is erythema (L lateral leg above malleolus).          Assessment & Plan:

## 2012-12-31 ENCOUNTER — Other Ambulatory Visit: Payer: Self-pay | Admitting: Family Medicine

## 2013-01-02 NOTE — Telephone Encounter (Signed)
Last OV 8-15 Med filled 7-14 #90 with 0 refills  Low Risk

## 2013-01-02 NOTE — Telephone Encounter (Signed)
Med faxed on 8-20

## 2013-01-11 ENCOUNTER — Telehealth: Payer: Self-pay | Admitting: Family Medicine

## 2013-01-11 MED ORDER — DULOXETINE HCL 60 MG PO CPEP: 120.0000 mg | ORAL_CAPSULE | Freq: Every day | ORAL | Status: DC

## 2013-01-11 NOTE — Telephone Encounter (Signed)
Patient is requesting a week's worth of samples of Cymbalta. She is currently out at her pharmacy. Please advise.

## 2013-01-11 NOTE — Telephone Encounter (Signed)
Pt notified that the samples are available at the front desk to be picked up.

## 2013-01-11 NOTE — Telephone Encounter (Signed)
Please advise if ok for samples of Cymbalta  Last OV 12-28-2012 Meds last filled on 01-13-12 #60 with 6 refills  Low Risk, UDS repeat not completed on 10-27-2012

## 2013-01-11 NOTE — Telephone Encounter (Signed)
Ok for samples if available 

## 2013-01-16 ENCOUNTER — Other Ambulatory Visit: Payer: Self-pay | Admitting: Family Medicine

## 2013-01-16 NOTE — Telephone Encounter (Signed)
Last OV 12-28-2012 Med last filled 8-15 #60 with 0 refills.   Low Risk

## 2013-01-17 ENCOUNTER — Telehealth: Payer: Self-pay | Admitting: *Deleted

## 2013-01-17 NOTE — Telephone Encounter (Signed)
Patient states she is out of town and needs this sent to [facility] ph# [phone]. She states she knows it is a little early, but her cellulitis has been worse lately and she has had to take more than usual for the last 2 months.

## 2013-01-17 NOTE — Telephone Encounter (Signed)
Unable to send controlled substance to out of town pharmacy

## 2013-01-17 NOTE — Telephone Encounter (Signed)
Refill request for Hydroco/acetamin 7.5-325mg  Last ov -12/28/12 Last date filled -12/28/12 #60 0 R Patient has contract  Last UDS 10/31/12  Please Advise   Ag cma

## 2013-01-17 NOTE — Telephone Encounter (Signed)
Last filled: 12/28/2012  Last visit: 12/28/2012  UDS-10/31/2012  Please advise. SW, CMA

## 2013-01-17 NOTE — Telephone Encounter (Signed)
Too early.  Last filled 8/15

## 2013-01-18 ENCOUNTER — Telehealth: Payer: Self-pay | Admitting: *Deleted

## 2013-01-18 NOTE — Telephone Encounter (Signed)
LMOVM informing pt that we would be unable to fill Rx until 9/15. Due to pharmacy not accepting the Rx until then.

## 2013-01-18 NOTE — Telephone Encounter (Signed)
Spoke with pt who was again demanding to have pain medication refilled. I advised her that it was too early to refill (last filled on 8/15) per MD note. Patient stated she was fine with that but wanted to complain about how "the other nurse spoke to her." Pt stated the "other nurse was demeaning and downright rude to her on the phone." Advised that I would follow up on that.

## 2013-01-18 NOTE — Telephone Encounter (Signed)
Spoke with pt. When she was advised that she could not receive her medication until 9/15 she was very adamant that her cellulitis causes this severe pain and she cannot function on only #60 for a month. Pt was informed that the medication is not intended to be taken as an every day medication. Pt was offered a referral to Pain management in order to receive more medication on a controlled basis. Pt lost her temper and yelled that it was a "cellulitis problem not a pain problem." Pt then asked to speak with a scheduler to make an appt to see [name].

## 2013-01-21 ENCOUNTER — Ambulatory Visit (INDEPENDENT_AMBULATORY_CARE_PROVIDER_SITE_OTHER): Payer: BC Managed Care – PPO | Admitting: Family Medicine

## 2013-01-21 ENCOUNTER — Encounter: Payer: Self-pay | Admitting: Family Medicine

## 2013-01-21 VITALS — BP 130/86 | HR 95 | Temp 98.2°F | Ht 62.5 in | Wt 294.4 lb

## 2013-01-21 DIAGNOSIS — M79605 Pain in left leg: Secondary | ICD-10-CM

## 2013-01-21 DIAGNOSIS — M79609 Pain in unspecified limb: Secondary | ICD-10-CM

## 2013-01-21 DIAGNOSIS — E538 Deficiency of other specified B group vitamins: Secondary | ICD-10-CM

## 2013-01-21 MED ORDER — LEVOFLOXACIN 500 MG PO TABS: 500.0000 mg | ORAL_TABLET | Freq: Every day | ORAL | Status: DC

## 2013-01-21 MED ORDER — HYDROCODONE-ACETAMINOPHEN 7.5-325 MG PO TABS: ORAL_TABLET | ORAL | Status: DC

## 2013-01-21 MED ORDER — CYANOCOBALAMIN 1000 MCG/ML IJ SOLN
1000.0000 ug | Freq: Once | INTRAMUSCULAR | Status: AC
  Administered 2013-01-21: 1000 ug via INTRAMUSCULAR

## 2013-01-21 MED ORDER — TERBINAFINE HCL 1 % EX CREA: TOPICAL_CREAM | Freq: Two times a day (BID) | CUTANEOUS | Status: DC

## 2013-01-21 NOTE — Patient Instructions (Addendum)
Take the Levaquin for another 10 days- if no improvement will refer to ID Pain meds as needed Use the cream twice daily Call with any questions or concerns Hang in there!

## 2013-01-22 ENCOUNTER — Other Ambulatory Visit: Payer: Self-pay | Admitting: Family Medicine

## 2013-01-22 NOTE — Telephone Encounter (Signed)
Last visit: 01/21/2013  Last filled: 12/06/2012  UDS: 10/31/2012  Contract on file  Pleases advise. SW, CMA

## 2013-02-04 ENCOUNTER — Telehealth: Payer: Self-pay | Admitting: Family Medicine

## 2013-02-04 DIAGNOSIS — L03116 Cellulitis of left lower limb: Secondary | ICD-10-CM

## 2013-02-04 NOTE — Progress Notes (Signed)
  Subjective:    Patient ID: [name], female    DOB: 12/30/1970, 42 y.o.   MRN: [id number]  HPI L lower leg cellulitis- pt completed 10 day course of Levaquin and while sxs have improved, they have not resolved.  Continues to have pain, redness, swelling and induration of LLE laterally.  No fevers, chills.  Had doppler to r/o DVT.  Tolerated Levaquin w/out N/V/D.   Review of Systems For ROS see HPI     Objective:   Physical Exam  Vitals reviewed. Constitutional: She is oriented to person, place, and time. She appears well-developed and well-nourished. No distress.  Cardiovascular: Intact distal pulses.   Neurological: She is alert and oriented to person, place, and time. No cranial nerve deficit. Coordination normal.  Skin: Skin is warm and dry. There is erythema (and induation of LLE laterally, very tender to touch).          Assessment & Plan:

## 2013-02-04 NOTE — Telephone Encounter (Signed)
Patient called and states that she was seen about a week ago for cellulitis and she is still in pain. Patient states that she was told to call back if she was not doing any better to get a referral.thanks

## 2013-02-04 NOTE — Telephone Encounter (Signed)
Referral placed to ID per last OV note.

## 2013-02-04 NOTE — Assessment & Plan Note (Signed)
Unchanged.  Repeat Levaquin course- if no improvement will need referral to ID.  Continue pain meds prn.  Reviewed supportive care and red flags that should prompt return.  Pt expressed understanding and is in agreement w/ plan.

## 2013-02-11 ENCOUNTER — Telehealth: Payer: Self-pay | Admitting: Family Medicine

## 2013-02-11 NOTE — Telephone Encounter (Signed)
Patient wanted to see if we had samples of DULoxetine (CYMBALTA) 60 MG capsule thanks

## 2013-02-11 NOTE — Telephone Encounter (Signed)
Pt.notified

## 2013-02-13 ENCOUNTER — Ambulatory Visit (INDEPENDENT_AMBULATORY_CARE_PROVIDER_SITE_OTHER): Payer: BC Managed Care – PPO | Admitting: Internal Medicine

## 2013-02-13 ENCOUNTER — Encounter: Payer: Self-pay | Admitting: Internal Medicine

## 2013-02-13 ENCOUNTER — Other Ambulatory Visit: Payer: Self-pay

## 2013-02-13 ENCOUNTER — Telehealth: Payer: Self-pay | Admitting: General Practice

## 2013-02-13 VITALS — BP 159/100 | HR 59 | Temp 97.4°F | Wt 294.0 lb

## 2013-02-13 DIAGNOSIS — M25569 Pain in unspecified knee: Secondary | ICD-10-CM

## 2013-02-13 DIAGNOSIS — A499 Bacterial infection, unspecified: Secondary | ICD-10-CM

## 2013-02-13 DIAGNOSIS — B9689 Other specified bacterial agents as the cause of diseases classified elsewhere: Secondary | ICD-10-CM

## 2013-02-13 DIAGNOSIS — L089 Local infection of the skin and subcutaneous tissue, unspecified: Secondary | ICD-10-CM

## 2013-02-13 DIAGNOSIS — M25562 Pain in left knee: Secondary | ICD-10-CM

## 2013-02-13 DIAGNOSIS — Z1231 Encounter for screening mammogram for malignant neoplasm of breast: Secondary | ICD-10-CM

## 2013-02-13 LAB — BASIC METABOLIC PANEL WITH GFR
BUN: 10 mg/dL (ref 6–23)
Calcium: 9.4 mg/dL (ref 8.4–10.5)
GFR, Est African American: >89 mL/min
Glucose, Bld: 85 mg/dL (ref 70–99)
Sodium: 139 mEq/L (ref 135–145)

## 2013-02-13 LAB — CBC WITH DIFFERENTIAL/PLATELET
Basophils Absolute: 0 10*3/uL (ref 0.0–0.1)
Eosinophils Absolute: 0.3 10*3/uL (ref 0.0–0.7)
Eosinophils Relative: 4 % (ref 0–5)
Lymphocytes Relative: 39 % (ref 12–46)
MCV: 88.6 fL (ref 78.0–100.0)
Platelets: 295 10*3/uL (ref 150–400)
RDW: 13.6 % (ref 11.5–15.5)
WBC: 7.4 10*3/uL (ref 4.0–10.5)

## 2013-02-13 MED ORDER — PREGABALIN 100 MG PO CAPS: 100.0000 mg | ORAL_CAPSULE | Freq: Two times a day (BID) | ORAL | Status: DC

## 2013-02-13 MED ORDER — DOXYCYCLINE HYCLATE 100 MG PO TABS: 100.0000 mg | ORAL_TABLET | Freq: Two times a day (BID) | ORAL | Status: DC

## 2013-02-13 MED ORDER — PROMETHAZINE HCL 25 MG PO TABS: 25.0000 mg | ORAL_TABLET | Freq: Three times a day (TID) | ORAL | Status: DC | PRN

## 2013-02-13 NOTE — Progress Notes (Signed)
[facility] NOTE  RFV: community referral for recurrent cellulitis Subjective:    Patient ID: [name], female    DOB: 1970/11/10, 42 y.o.   MRN: [id number]  HPI [name] is a 42yo F with history of HTN, fibromyalgia, who has been treated by her PCP over the last few months for recurrent cellulitis.Mayo reports having left lower cellulitis that she has been dealing with for 3 months. She states that her leg is exquisitely tender in the affected and surrounding area.  Interestingly, she did report having a prolonged episode of cellulitis to there other leg severalyears ago.  All: only nausea to doxy, no rash; cephalexin and sulfa -> hives, angio-edema  Current Outpatient Prescriptions on File Prior to Visit  Medication Sig Dispense Refill  . ARIPiprazole (ABILIFY) 5 MG tablet Take 5 mg by mouth daily.      . Cyanocobalamin 1000 MCG/ML KIT Inject 1 mL as directed every 30 (thirty) days. 1 injection every 30 days  12 kit  0   No current facility-administered medications on file prior to visit.   Active Ambulatory Problems    Diagnosis Date Noted  . HYPERLIPIDEMIA 09/22/2006  . OBESITY 01/03/2007  . ANXIETY 06/28/2006  . HYPERTENSION 06/28/2006  . SINUSITIS - ACUTE-NOS 12/31/2009  . URI 06/30/2009  . RHINITIS 11/24/2008  . PARONYCHIA, RIGHT GREAT TOE 06/11/2008  . CELLULITIS AND ABSCESS OF TRUNK 12/23/2008  . CELLULITIS 01/03/2007  . INGROWN TOENAIL, INFECTED 06/17/2008  . SKIN LESIONS, MULTIPLE 09/19/2008  . JOINT PAIN, LOWER LEG 09/22/2006  . CONTRACTURE OF TENDON 08/17/2009  . FIBROMYALGIA 09/20/2006  . LEG PAIN, BILATERAL 11/05/2008  . FATIGUE 09/19/2008  . PARESTHESIA, HANDS 12/23/2008  . PERIPHERAL EDEMA 03/26/2008  . HEADACHE 08/13/2008  . NAUSEA 08/13/2008  . HYPERGLYCEMIA, FASTING 10/08/2009  . OTHER NONSPECIFIC ABNORMAL SERUM ENZYME LEVELS 12/31/2009  . ADVERSE DRUG REACTION 04/03/2008  . OTHER ABNORMAL BLOOD CHEMISTRY 05/12/2010  . Tongue mass 08/08/2010  .  Elevated blood sugar 08/23/2010  . Sore throat 09/22/2010  . [name] (obstructive sleep apnea) 11/16/2010  . Uvulitis 11/25/2010  . Shortness of breath 12/15/2010  . Hot flashes 12/15/2010  . Sleep apnea   . Migraine headache   . Frequent urination 04/13/2011  . Retroperitoneal lymphadenopathy 10/19/2011  . Fungal dermatitis 11/15/2011  . DDD (degenerative disc disease), lumbar 11/30/2011  . Psoriasis 01/09/2012  . Tinea corporis 09/25/2012  . Bariatric surgery status 10/31/2012  . Leg pain, lateral 12/11/2012   Resolved Ambulatory Problems    Diagnosis Date Noted  . No Resolved Ambulatory Problems   Past Medical History  Diagnosis Date  . Obesity   . Hyperlipidemia   . Fibromyalgia   . Hypertension   . Anxiety   . Carpal tunnel syndrome   . Ankle fracture, right   . Cervical cancer 1998  . Allergic rhinitis    History  Substance Use Topics  . Smoking status: Never Smoker   . Smokeless tobacco: Never Used  . Alcohol Use: No  family history includes Allergies in her father and mother; Breast cancer in her maternal grandmother; Diabetes in her mother; Emphysema in her mother; Heart disease in her father; Hypertension in her mother; Prostate cancer in her father.   Review of Systems 12 point ROS is negative except for left leg cellulitis and chronic pain from fibromyalgia    Objective:   Physical Exam BP 159/100  Pulse 59  Temp(Src) 97.4 F (36.3 C) (Oral)  Wt 294 lb (133.358 kg)  BMI 52.88  kg/m2 Physical Exam  Constitutional: oriented to person, place, and time.  appears well-developed and well-nourished. No distress.  HENT:  Mouth/Throat: Oropharynx is clear and moist. No oropharyngeal exudate.  Lymphadenopathy:  no cervical adenopathy.  Neurological:  alert and oriented to person, place, and time.  Skin: Skin is warm and dry. Trace pitting edema to the left leg, a circular slightly raised plaque measured. Mild surrounding blanching erythema Psychiatric: He has a  normal mood and affect. His behavior is normal.        Assessment & Plan:  Presumed cellulitis = will give course of doxycycline 100mg  BID plus anti-emetic phenergan. Will get Ct of leg if not improving on antibiotics to ensure that there is no deep tissue abscess.  Pain management= will do trial of lyrica

## 2013-02-13 NOTE — Telephone Encounter (Signed)
Flexeril?  Ok for #45, 1 refill if Flexeril

## 2013-02-13 NOTE — Telephone Encounter (Signed)
Last OV 01-21-13 Med filled 01-22-13 #45 with 0 refills  Low Risk

## 2013-02-14 MED ORDER — CYCLOBENZAPRINE HCL 10 MG PO TABS: ORAL_TABLET | ORAL | Status: DC

## 2013-02-14 NOTE — Telephone Encounter (Signed)
Med filled.  

## 2013-02-18 ENCOUNTER — Telehealth: Payer: Self-pay | Admitting: Family Medicine

## 2013-02-18 NOTE — Telephone Encounter (Signed)
Found the paperwork, placed in your box

## 2013-02-18 NOTE — Telephone Encounter (Signed)
Please advise if you have seen this paperwork.

## 2013-02-18 NOTE — Telephone Encounter (Signed)
Sorry did not route last note, paperwork is in your box

## 2013-02-18 NOTE — Telephone Encounter (Signed)
I do not remember seeing this paperwork or having her hand this to me.  If she gave it to me, this is my fault and likely got shredded with her visit info.  Please apologize on my behalf and see if we can re-do it!

## 2013-02-18 NOTE — Telephone Encounter (Signed)
Patient states that at her last OV she brought in a form to help her get some assistance for her prescriptions. She wants to know the status. Please advise.

## 2013-02-19 ENCOUNTER — Telehealth: Payer: Self-pay | Admitting: *Deleted

## 2013-02-19 MED ORDER — HYDROCODONE-ACETAMINOPHEN 7.5-325 MG PO TABS: ORAL_TABLET | ORAL | Status: DC

## 2013-02-19 NOTE — Telephone Encounter (Signed)
Has this been completed?

## 2013-02-19 NOTE — Telephone Encounter (Signed)
Rx printed and awaiting signature.   

## 2013-02-19 NOTE — Telephone Encounter (Signed)
Ok to refill 

## 2013-02-19 NOTE — Telephone Encounter (Signed)
As I went back into the patient chart the Rx was actually filled on 01/21/2013. Sorry for the confusion. Please advise. SW, CMA

## 2013-02-19 NOTE — Telephone Encounter (Signed)
Sitting on the counter for me to work on tomorrow

## 2013-02-19 NOTE — Telephone Encounter (Signed)
Ordered by Dr. Drue Second 02/13/13.  Pt has [facility].  Needs authoriztion.  Order and 02/13/13 OV note taken to staff member for processing.  Pt asked to be called back once approval has occurred and CT has been scheduled.

## 2013-02-19 NOTE — Telephone Encounter (Signed)
Last visit-02/13/2013  Last filled- 01/31/2013  UDS-10/31/2012, contract signed   Please advise. SW

## 2013-02-19 NOTE — Telephone Encounter (Signed)
This is 11 days early- cannot fill at this time.  Earliest we can fill is 7 days early

## 2013-02-20 ENCOUNTER — Encounter: Payer: Self-pay | Admitting: Family Medicine

## 2013-02-20 NOTE — Telephone Encounter (Signed)
NOted

## 2013-02-20 NOTE — Telephone Encounter (Signed)
Rx signed along with forms that needed to be fill out

## 2013-02-20 NOTE — Telephone Encounter (Signed)
Forms was given to [facility] along with her RX

## 2013-02-21 ENCOUNTER — Ambulatory Visit: Payer: BC Managed Care – PPO

## 2013-02-25 ENCOUNTER — Ambulatory Visit
Admission: RE | Admit: 2013-02-25 | Discharge: 2013-02-25 | Disposition: A | Discharge status: Home / Self Care | Payer: BC Managed Care – PPO | Source: Ambulatory Visit

## 2013-02-25 DIAGNOSIS — Z1231 Encounter for screening mammogram for malignant neoplasm of breast: Secondary | ICD-10-CM

## 2013-02-26 ENCOUNTER — Ambulatory Visit (HOSPITAL_COMMUNITY)
Admission: RE | Admit: 2013-02-26 | Discharge: 2013-02-26 | Disposition: A | Discharge status: Home / Self Care | Payer: BC Managed Care – PPO | Source: Ambulatory Visit | Attending: Internal Medicine | Admitting: Internal Medicine

## 2013-02-26 DIAGNOSIS — L02419 Cutaneous abscess of limb, unspecified: Secondary | ICD-10-CM | POA: Insufficient documentation

## 2013-02-26 DIAGNOSIS — B9689 Other specified bacterial agents as the cause of diseases classified elsewhere: Secondary | ICD-10-CM

## 2013-02-26 MED ORDER — IOHEXOL 300 MG/ML  SOLN: 100.0000 mL | Freq: Once | INTRAMUSCULAR | Status: AC | PRN

## 2013-02-27 ENCOUNTER — Encounter: Payer: Self-pay | Admitting: Internal Medicine

## 2013-02-27 ENCOUNTER — Ambulatory Visit (INDEPENDENT_AMBULATORY_CARE_PROVIDER_SITE_OTHER): Payer: BC Managed Care – PPO | Admitting: Internal Medicine

## 2013-02-27 VITALS — BP 122/84 | HR 66 | Temp 98.1°F | Wt 296.0 lb

## 2013-02-27 DIAGNOSIS — L0390 Cellulitis, unspecified: Secondary | ICD-10-CM

## 2013-02-27 DIAGNOSIS — L0291 Cutaneous abscess, unspecified: Secondary | ICD-10-CM

## 2013-02-27 LAB — BASIC METABOLIC PANEL WITH GFR
BUN: 14 mg/dL (ref 6–23)
CO2: 29 mEq/L (ref 19–32)
Calcium: 9.7 mg/dL (ref 8.4–10.5)
GFR, Est African American: >89 mL/min
Sodium: 138 mEq/L (ref 135–145)

## 2013-02-27 MED ORDER — KETOROLAC TROMETHAMINE 30 MG/ML IJ SOLN
30.0000 mg | Freq: Once | INTRAMUSCULAR | Status: AC
  Administered 2013-02-27: 30 mg via INTRAVENOUS

## 2013-02-27 NOTE — Progress Notes (Signed)
[facility] NOTe  RFV: worsening cellulitis Subjective:    Patient ID: [name], female    DOB: 1971-03-26, 42 y.o.   MRN: [id number]  HPI 42yo F with worsening cellulitis with multiple drug allergy. She was initially seen by her primary care physician where she had received a few courses of antibiotics but has had intermittent improvement of patch of cellulitis to lateral aspect of left leg. She states that she had a similar episode years ago to the opposite leg that took many weeks to clear. She denies any recent insect bites or trauma that preceded her cellulitis. Her affected leg does have increased swelling which she underwent an ultrasound that ruled out DVT.  Current Outpatient Prescriptions on File Prior to Visit  Medication Sig Dispense Refill  . ALPRAZolam (XANAX) 0.5 MG tablet TAKE ONE TABLET BY MOUTH THREE TIMES DAILY AS NEEDED FOR SLEEP OR ANXIETY  90 tablet  0  . ARIPiprazole (ABILIFY) 5 MG tablet Take 5 mg by mouth daily.      . Choline Fenofibrate (TRILIPIX) 135 MG capsule Take 1 capsule (135 mg total) by mouth daily.  90 capsule  1  . clonazePAM (KLONOPIN) 1 MG tablet Take 1 tablet (1 mg total) by mouth at bedtime as needed (for neuropathy/restless leg).  30 tablet  3  . Cyanocobalamin 1000 MCG/ML KIT Inject 1 mL as directed every 30 (thirty) days. 1 injection every 30 days  12 kit  0  . cyclobenzaprine (FLEXERIL) 10 MG tablet TAKE ONE TABLET BY MOUTH THREE TIMES DAILY AS NEEDED FOR MUSCLE SPASM  45 tablet  1  . diclofenac sodium (VOLTAREN) 1 % GEL Apply 2 g topically 4 (four) times daily.  100 g  3  . DULoxetine (CYMBALTA) 60 MG capsule Take 2 capsules (120 mg total) by mouth daily. 2 po qAM  60 capsule  0  . gabapentin (NEURONTIN) 600 MG tablet Take 1 tablet (600 mg total) by mouth 3 (three) times daily.  90 tablet  3  . HYDROcodone-acetaminophen (NORCO) 7.5-325 MG per tablet TAKE ONE TABLET BY MOUTH EVERY 8 HOURS AS NEEDED FOR PAIN  60 tablet  0  . hydrocortisone  cream-nystatin cream-zinc oxide Apply 1 application topically 2 (two) times daily.  120 g  1  . levofloxacin (LEVAQUIN) 500 MG tablet Take 1 tablet (500 mg total) by mouth daily.  10 tablet  0  . nebivolol (BYSTOLIC) 5 MG tablet Take 1 tablet (5 mg total) by mouth daily.  30 tablet  3  . pregabalin (LYRICA) 100 MG capsule Take 1 capsule (100 mg total) by mouth 2 (two) times daily.  60 capsule  0  . promethazine (PHENERGAN) 25 MG tablet TAKE ONE TABLET BY MOUTH EVERY 6 HOURS AS NEEDED FOR NAUSEA  45 tablet  1  . promethazine (PHENERGAN) 25 MG tablet Take 1 tablet (25 mg total) by mouth every 8 (eight) hours as needed for nausea.  30 tablet  0  . rosuvastatin (CRESTOR) 20 MG tablet Take 1 tablet (20 mg total) by mouth daily.  90 tablet  1  . Syringe, Disposable, 3 ML MISC Use once monthly with injectable B12  100 each  11  . terbinafine (LAMISIL) 1 % cream Apply topically 2 (two) times daily.  42 g  1  . zolpidem (AMBIEN) 10 MG tablet Take 1 tablet (10 mg total) by mouth at bedtime as needed.  30 tablet  0  . SUMAtriptan (IMITREX) 100 MG tablet Take 1 tablet (100  mg total) by mouth every 2 (two) hours as needed. For migraines  12 tablet  3   No current facility-administered medications on file prior to visit.   Active Ambulatory Problems    Diagnosis Date Noted  . HYPERLIPIDEMIA 09/22/2006  . OBESITY 01/03/2007  . ANXIETY 06/28/2006  . HYPERTENSION 06/28/2006  . SINUSITIS - ACUTE-NOS 12/31/2009  . URI 06/30/2009  . RHINITIS 11/24/2008  . PARONYCHIA, RIGHT GREAT TOE 06/11/2008  . CELLULITIS AND ABSCESS OF TRUNK 12/23/2008  . CELLULITIS 01/03/2007  . INGROWN TOENAIL, INFECTED 06/17/2008  . SKIN LESIONS, MULTIPLE 09/19/2008  . JOINT PAIN, LOWER LEG 09/22/2006  . CONTRACTURE OF TENDON 08/17/2009  . FIBROMYALGIA 09/20/2006  . LEG PAIN, BILATERAL 11/05/2008  . FATIGUE 09/19/2008  . PARESTHESIA, HANDS 12/23/2008  . PERIPHERAL EDEMA 03/26/2008  . HEADACHE 08/13/2008  . NAUSEA 08/13/2008  .  HYPERGLYCEMIA, FASTING 10/08/2009  . OTHER NONSPECIFIC ABNORMAL SERUM ENZYME LEVELS 12/31/2009  . ADVERSE DRUG REACTION 04/03/2008  . OTHER ABNORMAL BLOOD CHEMISTRY 05/12/2010  . Tongue mass 08/08/2010  . Elevated blood sugar 08/23/2010  . Sore throat 09/22/2010  . [name] (obstructive sleep apnea) 11/16/2010  . Uvulitis 11/25/2010  . Shortness of breath 12/15/2010  . Hot flashes 12/15/2010  . Sleep apnea   . Migraine headache   . Frequent urination 04/13/2011  . Retroperitoneal lymphadenopathy 10/19/2011  . Fungal dermatitis 11/15/2011  . DDD (degenerative disc disease), lumbar 11/30/2011  . Psoriasis 01/09/2012  . Tinea corporis 09/25/2012  . Bariatric surgery status 10/31/2012  . Leg pain, lateral 12/11/2012   Resolved Ambulatory Problems    Diagnosis Date Noted  . No Resolved Ambulatory Problems   Past Medical History  Diagnosis Date  . Obesity   . Hyperlipidemia   . Fibromyalgia   . Hypertension   . Anxiety   . Carpal tunnel syndrome   . Ankle fracture, right   . Cervical cancer 1998  . Allergic rhinitis        Review of Systems Left leg pain and swelling. No fever, chills, nightsweats. 12 point ROS otherwise is negatvie    Objective:   Physical Exam BP 122/84  Pulse 66  Temp(Src) 98.1 F (36.7 C) (Oral)  Wt 296 lb (134.265 kg)  BMI 53.24 kg/m2 Physical Exam  Constitutional:  oriented to person, place, and time. appears well-developed and well-nourished. No distress.  HENT:  Mouth/Throat: Oropharynx is clear and moist. No oropharyngeal exudate.  Cardiovascular: Normal rate, regular rhythm and normal heart sounds. Exam reveals no gallop and no friction rub.  No murmur heard.  Pulmonary/Chest: Effort normal and breath sounds normal. No respiratory distress.  no wheezes.  Lymphadenopathy: no cervical adenopathy.  Skin: Skin is warm and dry. Ringworm to hands which has improved dorsum of hand bilaterally. Left lower leg cellulitis and painful  swelling Psychiatric:  normal mood and affect. behavior is normal.        Assessment & Plan:   cellulitis = not improved on doxycycline but has multiple allergies to antibiotics, will do course of vancomycin goal trough 10-15 for 10-14 days since limited options to treat her ongoing-slightly worsening cellulitis. Will arrange for picc line and antibiotics. Will check bmp and sed rate  Lower extremity pain = will give toradol; continue on lyrica  Ringworm = can do trial of topical antifungal OTC  rtc 14-16 days

## 2013-02-28 ENCOUNTER — Other Ambulatory Visit (HOSPITAL_COMMUNITY): Payer: Self-pay | Admitting: Interventional Radiology

## 2013-02-28 ENCOUNTER — Telehealth: Payer: Self-pay | Admitting: *Deleted

## 2013-02-28 LAB — SEDIMENTATION RATE: Sed Rate: 31 mm/hr — ABNORMAL HIGH (ref 0–22)

## 2013-02-28 NOTE — Telephone Encounter (Signed)
Called the patient and advised her that I have scheduled her PICC placement for Monday 03/04/13 at 11 am at [facility]. Advised her where to go and to get there about 15 min earlier and that [facility] will be calling her to set up appt

## 2013-03-04 ENCOUNTER — Ambulatory Visit (HOSPITAL_COMMUNITY)
Admission: RE | Admit: 2013-03-04 | Discharge: 2013-03-04 | Disposition: A | Discharge status: Home / Self Care | Payer: BC Managed Care – PPO | Source: Ambulatory Visit | Attending: Internal Medicine | Admitting: Internal Medicine

## 2013-03-04 ENCOUNTER — Telehealth: Payer: Self-pay | Admitting: *Deleted

## 2013-03-04 ENCOUNTER — Other Ambulatory Visit: Payer: Self-pay | Admitting: Internal Medicine

## 2013-03-04 ENCOUNTER — Encounter (HOSPITAL_COMMUNITY)
Admission: RE | Admit: 2013-03-04 | Discharge: 2013-03-04 | Disposition: A | Discharge status: Home / Self Care | Payer: BC Managed Care – PPO | Source: Ambulatory Visit | Attending: Internal Medicine | Admitting: Internal Medicine

## 2013-03-04 DIAGNOSIS — L02419 Cutaneous abscess of limb, unspecified: Secondary | ICD-10-CM | POA: Insufficient documentation

## 2013-03-04 DIAGNOSIS — L0291 Cutaneous abscess, unspecified: Secondary | ICD-10-CM | POA: Insufficient documentation

## 2013-03-04 DIAGNOSIS — L0390 Cellulitis, unspecified: Secondary | ICD-10-CM

## 2013-03-04 MED ORDER — HEPARIN SOD (PORK) LOCK FLUSH 100 UNIT/ML IV SOLN
250.0000 [IU] | INTRAVENOUS | Status: AC | PRN
  Administered 2013-03-04: 250 [IU]

## 2013-03-04 MED ORDER — HEPARIN SOD (PORK) LOCK FLUSH 100 UNIT/ML IV SOLN
INTRAVENOUS | Status: AC
  Filled 2013-03-04: qty 5

## 2013-03-04 MED ORDER — VANCOMYCIN HCL 10 G IV SOLR
1500.0000 mg | Freq: Once | INTRAVENOUS | Status: AC
  Administered 2013-03-04: 1500 mg via INTRAVENOUS
  Filled 2013-03-04: qty 1500

## 2013-03-04 NOTE — Telephone Encounter (Signed)
Let [facility] pharmacy know about patient's reaction to Vancomycin in short stay.  Per Dr. Drue Second, this is not likely to be an allergic reaction as it occurred more than 45 minutes into her dosing.  Dr. Drue Second recommends the patient take 25mg  benadryl with her vancomycin.  The patient should continue the vanc at home with [facility] pharmacy to dose.  RN informed short stay and [facility] pharmacy, they will let nursing know. Andree Coss, RN

## 2013-03-04 NOTE — Procedures (Signed)
US guided right SL cephalic vein PICC placed . Length 44 cm. Tip SVC/RA junction. No immediate complications. Ok to use.

## 2013-03-04 NOTE — Progress Notes (Signed)
Pt stated her head was slightly itching and her cheeks were flushed, no other symptoms or redness, temp 97.4.  I called Dr. Feliz Beam office and spoke with Feliz Beam, RN for Dr. Drue Second.  I reported the above and orders were given to stop the medicine, watch the pt for , and was also given a prn order for benadryl if her itching got worse.  May let the pt go if no further symptoms occur.  Renee, Rn called the office back at the request of the pt to ask if she should take vancomycin at home with home health still and they stated that they will update home health so they are aware to watch her and they will continue with the current orders.  Pt agreed.

## 2013-03-11 ENCOUNTER — Emergency Department (HOSPITAL_COMMUNITY)
Admission: EM | Admit: 2013-03-11 | Discharge: 2013-03-12 | Disposition: A | Discharge status: Home / Self Care | Payer: BC Managed Care – PPO | Attending: Emergency Medicine | Admitting: Emergency Medicine

## 2013-03-11 ENCOUNTER — Encounter (HOSPITAL_COMMUNITY): Payer: Self-pay | Admitting: Emergency Medicine

## 2013-03-11 DIAGNOSIS — E669 Obesity, unspecified: Secondary | ICD-10-CM | POA: Insufficient documentation

## 2013-03-11 DIAGNOSIS — Z8541 Personal history of malignant neoplasm of cervix uteri: Secondary | ICD-10-CM | POA: Insufficient documentation

## 2013-03-11 DIAGNOSIS — F411 Generalized anxiety disorder: Secondary | ICD-10-CM | POA: Insufficient documentation

## 2013-03-11 DIAGNOSIS — E785 Hyperlipidemia, unspecified: Secondary | ICD-10-CM | POA: Insufficient documentation

## 2013-03-11 DIAGNOSIS — G43909 Migraine, unspecified, not intractable, without status migrainosus: Secondary | ICD-10-CM | POA: Insufficient documentation

## 2013-03-11 DIAGNOSIS — I10 Essential (primary) hypertension: Secondary | ICD-10-CM | POA: Insufficient documentation

## 2013-03-11 DIAGNOSIS — M79609 Pain in unspecified limb: Secondary | ICD-10-CM | POA: Insufficient documentation

## 2013-03-11 DIAGNOSIS — Z8781 Personal history of (healed) traumatic fracture: Secondary | ICD-10-CM | POA: Insufficient documentation

## 2013-03-11 DIAGNOSIS — L02419 Cutaneous abscess of limb, unspecified: Secondary | ICD-10-CM | POA: Insufficient documentation

## 2013-03-11 DIAGNOSIS — R609 Edema, unspecified: Secondary | ICD-10-CM | POA: Insufficient documentation

## 2013-03-11 DIAGNOSIS — Z8709 Personal history of other diseases of the respiratory system: Secondary | ICD-10-CM | POA: Insufficient documentation

## 2013-03-11 DIAGNOSIS — Z8669 Personal history of other diseases of the nervous system and sense organs: Secondary | ICD-10-CM | POA: Insufficient documentation

## 2013-03-11 DIAGNOSIS — M79605 Pain in left leg: Secondary | ICD-10-CM

## 2013-03-11 DIAGNOSIS — Z79899 Other long term (current) drug therapy: Secondary | ICD-10-CM | POA: Insufficient documentation

## 2013-03-11 DIAGNOSIS — L0390 Cellulitis, unspecified: Secondary | ICD-10-CM

## 2013-03-11 MED ORDER — HYDROMORPHONE HCL PF 2 MG/ML IJ SOLN
2.0000 mg | Freq: Once | INTRAMUSCULAR | Status: AC
  Administered 2013-03-11: 2 mg via INTRAMUSCULAR
  Filled 2013-03-11: qty 1

## 2013-03-11 NOTE — ED Notes (Addendum)
Here for pain control. Has been dealing with cellulitis of L lateral ankle/shin/calf for ~ 4 months. Has R upper arm PICC line. Receiving vanc at home. Has ID appointment in the morning. Came here tonight b/c hydrocodone not touching the pain at home. Last norco 7.5/325 (2 tabs) ~ 2 hrs ago (2000).

## 2013-03-11 NOTE — ED Notes (Signed)
See triage notes below. Pt reports she is currently being tx for cellulitis at home through her right arm PICC line. Pt states she is here for help with her pain control, pt states she has not been able to sleep in two days. Pt states she is following up with her MD tomorrow morning however, could not bare the pain any longer.

## 2013-03-11 NOTE — ED Notes (Signed)
Will discharge pt after 30 minutes after adm of IM pain medication.

## 2013-03-11 NOTE — ED Provider Notes (Signed)
CSN: [id number]     Arrival date & time 03/11/13  2148 History  This chart was scribed for non-physician practitioner working with April Smitty Cords, MD by Leone Payor, ED Scribe. This patient was seen in room TR09C/TR09C and the patient's care was started at 2148.    Chief Complaint  Patient presents with  . Leg Pain    The history is provided by the patient. No language interpreter was used.    HPI Comments: [name] is a 42 y.o. female who presents to the Emergency Department requesting pain control today. Pt has been diagnosed with cellulitis to the left lower leg and has had this for the past 4 months. She states the redness and swelling is unchanged. Pt has taken vancomycin for 1 week without improvement of pain. She was taking this through her PICC line which is in her right arm. She has also been taking hydrocodone without relief of pain. Pt states she has been in severe pain and is unable to sleep at night. She states having an appointment with Infectious Disease tomorrow morning. She denies fever, chills, diaphoresis. She has not had any other changes or worsening of redness or swelling to the leg.   Past Medical History  Diagnosis Date  . Obesity   . Hyperlipidemia   . Fibromyalgia   . Hypertension   . Anxiety   . Carpal tunnel syndrome   . Ankle fracture, right   . Cervical cancer 1998  . Allergic rhinitis   . Sleep apnea   . Migraine headache    Past Surgical History  Procedure Laterality Date  . Cholecystectomy  07/1992  . Abdominal hysterectomy  10/1996  . Carpal tunnel release      bilateral   . Cesarean section    . Abdominal surgery  jan/11/2012    gastric bypass surgery   Family History  Problem Relation Age of Onset  . Diabetes Mother   . Emphysema Mother   . Allergies Mother   . Hypertension Mother   . Heart disease Father   . Allergies Father   . Prostate cancer Father   . Breast cancer Maternal Grandmother    History  Substance Use  Topics  . Smoking status: Never Smoker   . Smokeless tobacco: Never Used  . Alcohol Use: No   OB History   Grav Para Term Preterm Abortions TAB SAB Ect Mult Living   1 1        1      Review of Systems  Constitutional: Negative for fever, chills and diaphoresis.  Skin:       Redness and swelling of skin on left lower extremity  All other systems reviewed and are negative.    Allergies  Aspirin; Bactrim; Cephalexin; Clindamycin/lincomycin; Doxycycline; Lisinopril; Naproxen; Niacin; Sulfamethoxazole-trimethoprim; Benzoin compound; Iohexol; Pnu-imune; and Sulfonamide derivatives  Home Medications   Current Outpatient Rx  Name  Route  Sig  Dispense  Refill  . ALPRAZolam (XANAX) 0.5 MG tablet   Oral   Take 0.5 mg by mouth 3 (three) times daily as needed for sleep or anxiety.         . ARIPiprazole (ABILIFY) 5 MG tablet   Oral   Take 5 mg by mouth daily.         Marland Kitchen BIOTIN PO   Oral   Take 1 tablet by mouth daily.         . calcium citrate (CALCITRATE - DOSED IN MG ELEMENTAL CALCIUM) 950 MG tablet  Oral   Take 1 tablet by mouth daily.         . Choline Fenofibrate (TRILIPIX) 135 MG capsule   Oral   Take 135 mg by mouth daily.         . clonazePAM (KLONOPIN) 1 MG tablet   Oral   Take 1 mg by mouth at bedtime as needed (for restless legs).         . Cyanocobalamin 1000 MCG/ML KIT   Injection   Inject 1 mL as directed every 30 (thirty) days. 1 injection every 30 days   12 kit   0   . cyclobenzaprine (FLEXERIL) 10 MG tablet   Oral   Take 10 mg by mouth 3 (three) times daily as needed for muscle spasms.         . DULoxetine (CYMBALTA) 60 MG capsule   Oral   Take 120 mg by mouth daily.         Marland Kitchen gabapentin (NEURONTIN) 600 MG tablet   Oral   Take 600 mg by mouth 3 (three) times daily.         Marland Kitchen HYDROcodone-acetaminophen (NORCO) 7.5-325 MG per tablet   Oral   Take 1 tablet by mouth every 6 (six) hours as needed for pain.         . nebivolol  (BYSTOLIC) 5 MG tablet   Oral   Take 5 mg by mouth daily.         . pregabalin (LYRICA) 100 MG capsule   Oral   Take 100 mg by mouth 2 (two) times daily.         . promethazine (PHENERGAN) 25 MG tablet   Oral   Take 25 mg by mouth every 6 (six) hours as needed for nausea.         . rosuvastatin (CRESTOR) 20 MG tablet   Oral   Take 20 mg by mouth daily.         . SUMAtriptan (IMITREX) 100 MG tablet   Oral   Take 100 mg by mouth every 2 (two) hours as needed for migraine. May repeat in 2 hours if headache persists or recurs.         . terbinafine (LAMISIL) 1 % cream   Topical   Apply 1 application topically 2 (two) times daily.          BP 171/84  Pulse 64  Temp(Src) 98 F (36.7 C) (Oral)  Resp 20  Ht 5' 2.5" (1.588 m)  Wt 301 lb 1 oz (136.561 kg)  BMI 54.15 kg/m2  SpO2 98% Physical Exam  Nursing note and vitals reviewed. Constitutional: She is oriented to person, place, and time. She appears well-developed and well-nourished. No distress.  HENT:  Head: Normocephalic and atraumatic.  Eyes: Conjunctivae and EOM are normal.  Neck: Normal range of motion.  Pulmonary/Chest: Effort normal.  Musculoskeletal: Normal range of motion. She exhibits edema (left lower leg) and tenderness (left lower leg).  Neurological: She is alert and oriented to person, place, and time.  Skin: She is not diaphoretic.  Erythema to left lateral lower leg that spread somewhat circumvents around the leg. No erythema streaks. No bleeding, weeping, or drainage. Diffuse tenderness around the area. Distally neurovascularly intact.   Psychiatric: She has a normal mood and affect. Her behavior is normal.    ED Course  Procedures DIAGNOSTIC STUDIES: Oxygen Saturation is 98% on RA, normal by my interpretation.    COORDINATION OF CARE: 11:32 PM patient seen and  evaluated. She denies any significant changes to the infection in her leg. She is complaining of uncontrolled pain. She does have  close outpatient followup. She is nontoxic appearing. Afebrile at triage. Patient has undergone thorough evaluation of her symptoms including CT scan, ultrasound studies to rule out DVT.  At this time will order pain medication to treat pain. Discussed treatment plan with pt at bedside and pt agreed to plan.     MDM   1. Leg pain, left   2. Cellulitis      I personally performed the services described in this documentation, which was scribed in my presence. The recorded information has been reviewed and is accurate.   Angus Seller, PA-C 03/12/13 [phone]

## 2013-03-12 ENCOUNTER — Encounter: Payer: Self-pay | Admitting: Internal Medicine

## 2013-03-12 ENCOUNTER — Ambulatory Visit (INDEPENDENT_AMBULATORY_CARE_PROVIDER_SITE_OTHER): Payer: BC Managed Care – PPO | Admitting: Internal Medicine

## 2013-03-12 VITALS — BP 120/81 | HR 75 | Temp 97.7°F | Wt 299.0 lb

## 2013-03-12 DIAGNOSIS — L0390 Cellulitis, unspecified: Secondary | ICD-10-CM

## 2013-03-12 DIAGNOSIS — L0291 Cutaneous abscess, unspecified: Secondary | ICD-10-CM

## 2013-03-12 MED ORDER — OXYCODONE HCL 10 MG PO TABS: 10.0000 mg | ORAL_TABLET | Freq: Four times a day (QID) | ORAL | Status: DC

## 2013-03-12 NOTE — Progress Notes (Signed)
[facility] NOTE  RFV: recurrent cellulitis Subjective:    Patient ID: [name], female    DOB: 09-Jan-1971, 42 y.o.   MRN: [id number]  HPI  42yo F with FM, multiple drug allergies, for cellulitis. Has failed oral therapy, and has been on vanco 10 /14 days swelling of leg causing extreme pain went to ER for pain management. She states that the swelling she has reaccumulated is tight. Throbbing sensation. Her leg does not appear to have cellulitis, although has a raised erythematous plaque that is non-blanching measuring 6 x 6 cm superior lateral maleolus of left leg. She is not wrapping her leg nor wearing compression stalkings. She previously had ultrasound that ruled out clot. She states this process is similar to what she had in the past.  Current Outpatient Prescriptions on File Prior to Visit  Medication Sig Dispense Refill  . ALPRAZolam (XANAX) 0.5 MG tablet Take 0.5 mg by mouth 3 (three) times daily as needed for sleep or anxiety.      . ARIPiprazole (ABILIFY) 5 MG tablet Take 5 mg by mouth daily.      Marland Kitchen BIOTIN PO Take 1 tablet by mouth daily.      . calcium citrate (CALCITRATE - DOSED IN MG ELEMENTAL CALCIUM) 950 MG tablet Take 1 tablet by mouth daily.      . Choline Fenofibrate (TRILIPIX) 135 MG capsule Take 135 mg by mouth daily.      . clonazePAM (KLONOPIN) 1 MG tablet Take 1 mg by mouth at bedtime as needed (for restless legs).      . Cyanocobalamin 1000 MCG/ML KIT Inject 1 mL as directed every 30 (thirty) days. 1 injection every 30 days  12 kit  0  . cyclobenzaprine (FLEXERIL) 10 MG tablet Take 10 mg by mouth 3 (three) times daily as needed for muscle spasms.      . DULoxetine (CYMBALTA) 60 MG capsule Take 120 mg by mouth daily.      Marland Kitchen gabapentin (NEURONTIN) 600 MG tablet Take 600 mg by mouth 3 (three) times daily.      Marland Kitchen HYDROcodone-acetaminophen (NORCO) 7.5-325 MG per tablet Take 1 tablet by mouth every 6 (six) hours as needed for pain.      . nebivolol (BYSTOLIC) 5 MG tablet  Take 5 mg by mouth daily.      . pregabalin (LYRICA) 100 MG capsule Take 100 mg by mouth 2 (two) times daily.      . promethazine (PHENERGAN) 25 MG tablet Take 25 mg by mouth every 6 (six) hours as needed for nausea.      . rosuvastatin (CRESTOR) 20 MG tablet Take 20 mg by mouth daily.      . SUMAtriptan (IMITREX) 100 MG tablet Take 100 mg by mouth every 2 (two) hours as needed for migraine. May repeat in 2 hours if headache persists or recurs.      . terbinafine (LAMISIL) 1 % cream Apply 1 application topically 2 (two) times daily.       No current facility-administered medications on file prior to visit.   Active Ambulatory Problems    Diagnosis Date Noted  . HYPERLIPIDEMIA 09/22/2006  . OBESITY 01/03/2007  . ANXIETY 06/28/2006  . HYPERTENSION 06/28/2006  . SINUSITIS - ACUTE-NOS 12/31/2009  . URI 06/30/2009  . RHINITIS 11/24/2008  . PARONYCHIA, RIGHT GREAT TOE 06/11/2008  . CELLULITIS AND ABSCESS OF TRUNK 12/23/2008  . CELLULITIS 01/03/2007  . INGROWN TOENAIL, INFECTED 06/17/2008  . SKIN LESIONS, MULTIPLE 09/19/2008  .  JOINT PAIN, LOWER LEG 09/22/2006  . CONTRACTURE OF TENDON 08/17/2009  . FIBROMYALGIA 09/20/2006  . LEG PAIN, BILATERAL 11/05/2008  . FATIGUE 09/19/2008  . PARESTHESIA, HANDS 12/23/2008  . PERIPHERAL EDEMA 03/26/2008  . HEADACHE 08/13/2008  . NAUSEA 08/13/2008  . HYPERGLYCEMIA, FASTING 10/08/2009  . OTHER NONSPECIFIC ABNORMAL SERUM ENZYME LEVELS 12/31/2009  . ADVERSE DRUG REACTION 04/03/2008  . OTHER ABNORMAL BLOOD CHEMISTRY 05/12/2010  . Tongue mass 08/08/2010  . Elevated blood sugar 08/23/2010  . Sore throat 09/22/2010  . [name] (obstructive sleep apnea) 11/16/2010  . Uvulitis 11/25/2010  . Shortness of breath 12/15/2010  . Hot flashes 12/15/2010  . Sleep apnea   . Migraine headache   . Frequent urination 04/13/2011  . Retroperitoneal lymphadenopathy 10/19/2011  . Fungal dermatitis 11/15/2011  . DDD (degenerative disc disease), lumbar 11/30/2011  .  Psoriasis 01/09/2012  . Tinea corporis 09/25/2012  . Bariatric surgery status 10/31/2012  . Leg pain, lateral 12/11/2012   Resolved Ambulatory Problems    Diagnosis Date Noted  . No Resolved Ambulatory Problems   Past Medical History  Diagnosis Date  . Obesity   . Hyperlipidemia   . Fibromyalgia   . Hypertension   . Anxiety   . Carpal tunnel syndrome   . Ankle fracture, right   . Cervical cancer 1998  . Allergic rhinitis       Review of Systems 12 point ROS negative except for swelling in left leg    Objective:   Physical Exam BP 120/81  Pulse 75  Temp(Src) 97.7 F (36.5 C) (Oral)  Wt 299 lb (135.626 kg)  BMI 53.78 kg/m2 Ext: left leg swelling, non-pitting edema. Tender,  Skin = no erythema except a plaque, oval erythematous 7 x 7 cm. Non-blanching, slight exfoliation in the periphery     Assessment & Plan:   Cellulitis = finish vanco on Monday, we will see her back early next week. Since she has several courses of oral and 1 course of IV antibiotics, i feel the original cellulitis is gone. Unsure if the current plaque could be other type of dermatologic process. We will refer to dr. Dorita Sciara office for evaluation. I feel after Monday, she should not need further antibiotics.  Lower extremity edema = not considerably worse than a few weeks ago but noticeably in comparison to unaffected leg. She did have recent lower extremity imaging that did not reveal DVT. We will see her back in 4 days to see if it is improved at all with compression socks/gentle ace wrapping.  Pain mgmt = oxycodone 10 mg Q6 hr PRN

## 2013-03-12 NOTE — ED Provider Notes (Signed)
Medical screening examination/treatment/procedure(s) were performed by non-physician practitioner and as supervising physician I was immediately available for consultation/collaboration.  EKG Interpretation   None        Aarilyn Dye K Myrical Andujo-Rasch, MD 03/12/13 0619 

## 2013-03-14 ENCOUNTER — Telehealth: Payer: Self-pay | Admitting: *Deleted

## 2013-03-14 NOTE — Telephone Encounter (Signed)
Per Dr Drue Second called [facility] and scheduled an appt for this patient. Their first available appt is 04/15/13 @ 10:20 pm. Will advise the patient of the appt and the address and phone number. They are located at [facility] and the number is [phone].

## 2013-03-19 ENCOUNTER — Encounter: Payer: Self-pay | Admitting: Internal Medicine

## 2013-03-19 ENCOUNTER — Ambulatory Visit (INDEPENDENT_AMBULATORY_CARE_PROVIDER_SITE_OTHER): Payer: BC Managed Care – PPO | Admitting: Internal Medicine

## 2013-03-19 VITALS — BP 126/81 | HR 90 | Temp 98.3°F | Ht 62.5 in | Wt 293.0 lb

## 2013-03-19 DIAGNOSIS — L0291 Cutaneous abscess, unspecified: Secondary | ICD-10-CM

## 2013-03-19 DIAGNOSIS — L0390 Cellulitis, unspecified: Secondary | ICD-10-CM

## 2013-03-19 NOTE — Progress Notes (Signed)
[facility] NOTE  RFV: recurrent cellulitis Subjective:    Patient ID: [name], female    DOB: 1970-05-29, 42 y.o.   MRN: [id number]  HPI 42yo F with FM, who has history of multiple drug allergies,who is being seen for cellulitis. Has failed oral therapy, and has finished 14 days vancomycin for cellulitis which has now resolved except for a red macular patch measuring 7 x 7  Cm. She states that swelling of leg is improved since last week. She is  wrapping her leg daily. No fever, chills, nightsweats.  Current Outpatient Prescriptions on File Prior to Visit  Medication Sig Dispense Refill  . ALPRAZolam (XANAX) 0.5 MG tablet Take 0.5 mg by mouth 3 (three) times daily as needed for sleep or anxiety.      . ARIPiprazole (ABILIFY) 5 MG tablet Take 5 mg by mouth daily.      Marland Kitchen BIOTIN PO Take 1 tablet by mouth daily.      . calcium citrate (CALCITRATE - DOSED IN MG ELEMENTAL CALCIUM) 950 MG tablet Take 1 tablet by mouth daily.      . Choline Fenofibrate (TRILIPIX) 135 MG capsule Take 135 mg by mouth daily.      . clonazePAM (KLONOPIN) 1 MG tablet Take 1 mg by mouth at bedtime as needed (for restless legs).      . Cyanocobalamin 1000 MCG/ML KIT Inject 1 mL as directed every 30 (thirty) days. 1 injection every 30 days  12 kit  0  . cyclobenzaprine (FLEXERIL) 10 MG tablet Take 10 mg by mouth 3 (three) times daily as needed for muscle spasms.      . DULoxetine (CYMBALTA) 60 MG capsule Take 120 mg by mouth daily.      Marland Kitchen gabapentin (NEURONTIN) 600 MG tablet Take 600 mg by mouth 3 (three) times daily.      Marland Kitchen HYDROcodone-acetaminophen (NORCO) 7.5-325 MG per tablet Take 1 tablet by mouth every 6 (six) hours as needed for pain.      . nebivolol (BYSTOLIC) 5 MG tablet Take 5 mg by mouth daily.      . Oxycodone HCl 10 MG TABS Take 1 tablet (10 mg total) by mouth every 6 (six) hours.  30 tablet  0  . pregabalin (LYRICA) 100 MG capsule Take 100 mg by mouth 2 (two) times daily.      . promethazine (PHENERGAN)  25 MG tablet Take 25 mg by mouth every 6 (six) hours as needed for nausea.      . rosuvastatin (CRESTOR) 20 MG tablet Take 20 mg by mouth daily.      . SUMAtriptan (IMITREX) 100 MG tablet Take 100 mg by mouth every 2 (two) hours as needed for migraine. May repeat in 2 hours if headache persists or recurs.      . terbinafine (LAMISIL) 1 % cream Apply 1 application topically 2 (two) times daily.       No current facility-administered medications on file prior to visit.   Active Ambulatory Problems    Diagnosis Date Noted  . HYPERLIPIDEMIA 09/22/2006  . OBESITY 01/03/2007  . ANXIETY 06/28/2006  . HYPERTENSION 06/28/2006  . SINUSITIS - ACUTE-NOS 12/31/2009  . URI 06/30/2009  . RHINITIS 11/24/2008  . PARONYCHIA, RIGHT GREAT TOE 06/11/2008  . CELLULITIS AND ABSCESS OF TRUNK 12/23/2008  . CELLULITIS 01/03/2007  . INGROWN TOENAIL, INFECTED 06/17/2008  . SKIN LESIONS, MULTIPLE 09/19/2008  . JOINT PAIN, LOWER LEG 09/22/2006  . CONTRACTURE OF TENDON 08/17/2009  . FIBROMYALGIA 09/20/2006  .  LEG PAIN, BILATERAL 11/05/2008  . FATIGUE 09/19/2008  . PARESTHESIA, HANDS 12/23/2008  . PERIPHERAL EDEMA 03/26/2008  . HEADACHE 08/13/2008  . NAUSEA 08/13/2008  . HYPERGLYCEMIA, FASTING 10/08/2009  . OTHER NONSPECIFIC ABNORMAL SERUM ENZYME LEVELS 12/31/2009  . ADVERSE DRUG REACTION 04/03/2008  . OTHER ABNORMAL BLOOD CHEMISTRY 05/12/2010  . Tongue mass 08/08/2010  . Elevated blood sugar 08/23/2010  . Sore throat 09/22/2010  . [name] (obstructive sleep apnea) 11/16/2010  . Uvulitis 11/25/2010  . Shortness of breath 12/15/2010  . Hot flashes 12/15/2010  . Sleep apnea   . Migraine headache   . Frequent urination 04/13/2011  . Retroperitoneal lymphadenopathy 10/19/2011  . Fungal dermatitis 11/15/2011  . DDD (degenerative disc disease), lumbar 11/30/2011  . Psoriasis 01/09/2012  . Tinea corporis 09/25/2012  . Bariatric surgery status 10/31/2012  . Leg pain, lateral 12/11/2012   Resolved Ambulatory  Problems    Diagnosis Date Noted  . No Resolved Ambulatory Problems   Past Medical History  Diagnosis Date  . Obesity   . Hyperlipidemia   . Fibromyalgia   . Hypertension   . Anxiety   . Carpal tunnel syndrome   . Ankle fracture, right   . Cervical cancer 1998  . Allergic rhinitis       Review of Systems Review of Systems  Constitutional: Negative for fever, chills, diaphoresis, activity change, appetite change, fatigue and unexpected weight change.  HENT: Negative for congestion, sore throat, rhinorrhea, sneezing, trouble swallowing and sinus pressure.  Eyes: Negative for photophobia and visual disturbance.  Respiratory: Negative for cough, chest tightness, shortness of breath, wheezing and stridor.  Cardiovascular: Negative for chest pain, palpitations and leg swelling.  Gastrointestinal: Negative for nausea, vomiting, abdominal pain, diarrhea, constipation, blood in stool, abdominal distention and anal bleeding.  Genitourinary: Negative for dysuria, hematuria, flank pain and difficulty urinating.  Musculoskeletal: Negative for myalgias, back pain, joint swelling, arthralgias and gait problem.  Skin: Negative for color change, pallor, rash and wound.  Neurological: Negative for dizziness, tremors, weakness and light-headedness.  Hematological: Negative for adenopathy. Does not bruise/bleed easily.  Psychiatric/Behavioral: Negative for behavioral problems, confusion, sleep disturbance, dysphoric mood, decreased concentration and agitation.        Objective:   Physical Exam BP 126/81  Pulse 90  Temp(Src) 98.3 F (36.8 C) (Oral)  Ht 5' 2.5" (1.588 m)  Wt 293 lb (132.904 kg)  BMI 52.70 kg/m2 Physical Exam  Constitutional: oriented to person, place, and time.  appears well-developed and well-nourished. No distress.  HENT:  Mouth/Throat: Oropharynx is clear and moist. No oropharyngeal exudate.  Lymphadenopathy:  no cervical adenopathy.  Ext: right arm picc line  c/d/i Skin: Skin is warm and dry. No rash noted. No erythema.         Assessment & Plan:  Pulled picc line since she finished course of therapy  . Cellulitis resolved although has residual erythamatous plaque 7 x 7 cm nontender? Derm rererral for any other etiologies to be concerned about that is causing this residual macular non-blanching patch.

## 2013-03-20 ENCOUNTER — Other Ambulatory Visit: Payer: Self-pay | Admitting: General Practice

## 2013-03-20 ENCOUNTER — Telehealth: Payer: Self-pay | Admitting: *Deleted

## 2013-03-20 MED ORDER — ARIPIPRAZOLE 5 MG PO TABS: 7.5000 mg | ORAL_TABLET | Freq: Every day | ORAL | Status: DC

## 2013-03-20 NOTE — Telephone Encounter (Signed)
Called patient about her referral and she advised she is feeling much better and wants to wait a week or so and see if that stays the same. She advised she does not think she needs to referral to dermatology at this time. I advised her the appt is not until 04/15/13 which gives her a little wiggle room if she wants to cancel it. She advised she will call back but end of next week and see how she is feeling then and if still good we will cancel it then. I advised her to just give me a call to remind me and I will cancel for her.

## 2013-03-25 ENCOUNTER — Other Ambulatory Visit: Payer: Self-pay | Admitting: Family Medicine

## 2013-03-25 MED ORDER — HYDROCODONE-ACETAMINOPHEN 7.5-325 MG PO TABS: 1.0000 | ORAL_TABLET | Freq: Four times a day (QID) | ORAL | Status: DC | PRN

## 2013-03-25 NOTE — Telephone Encounter (Signed)
Med filled, uds sample given, pt given rx.

## 2013-03-25 NOTE — Telephone Encounter (Signed)
Last OV 01-21-13 Med filled 02-19-13  Low risk due for UDS.

## 2013-03-25 NOTE — Telephone Encounter (Signed)
Med filled.  

## 2013-03-25 NOTE — Telephone Encounter (Signed)
Patient is calling to request refill on Hydrocodone. Please advise.

## 2013-03-28 ENCOUNTER — Encounter: Payer: BC Managed Care – PPO | Admitting: Family Medicine

## 2013-03-29 ENCOUNTER — Other Ambulatory Visit: Payer: Self-pay | Admitting: Family Medicine

## 2013-03-29 NOTE — Telephone Encounter (Signed)
Last seen-01/21/2013  Last filled-was given a by a historical provider  UDS-10/31/2012  Please advise SW

## 2013-04-01 ENCOUNTER — Telehealth: Payer: Self-pay | Admitting: *Deleted

## 2013-04-01 NOTE — Telephone Encounter (Signed)
Patient called back and decided to keep the dermatology referral. I gave her the appt information and she advised she is still having a burning pain in the area and wondered who she should see about that. Advised her since Dr Drue Second does not think it is an infection she may want to call her PCP but she should still keep the dermatology appt also.

## 2013-04-03 ENCOUNTER — Encounter: Payer: Self-pay | Admitting: Internal Medicine

## 2013-04-17 ENCOUNTER — Other Ambulatory Visit: Payer: Self-pay | Admitting: Family Medicine

## 2013-04-17 NOTE — Telephone Encounter (Signed)
Med filled.  

## 2013-04-17 NOTE — Telephone Encounter (Signed)
Last OV 01-21-13 Med filled #30 with 0 02-13-13  States next to historic med inpatient standard.

## 2013-04-23 ENCOUNTER — Encounter: Payer: Self-pay | Admitting: Family Medicine

## 2013-04-23 ENCOUNTER — Ambulatory Visit (INDEPENDENT_AMBULATORY_CARE_PROVIDER_SITE_OTHER): Payer: BC Managed Care – PPO | Admitting: Family Medicine

## 2013-04-23 VITALS — BP 126/80 | HR 69 | Temp 98.3°F | Ht 62.5 in | Wt 298.6 lb

## 2013-04-23 DIAGNOSIS — Z0000 Encounter for general adult medical examination without abnormal findings: Secondary | ICD-10-CM | POA: Insufficient documentation

## 2013-04-23 DIAGNOSIS — E669 Obesity, unspecified: Secondary | ICD-10-CM

## 2013-04-23 LAB — CBC WITH DIFFERENTIAL/PLATELET
Basophils Relative: 0.4 % (ref 0.0–3.0)
Eosinophils Absolute: 0.3 10*3/uL (ref 0.0–0.7)
Eosinophils Relative: 4.1 % (ref 0.0–5.0)
Lymphocytes Relative: 35.4 % (ref 12.0–46.0)
MCHC: 33.1 g/dL (ref 30.0–36.0)
Neutrophils Relative %: 53.3 % (ref 43.0–77.0)
RBC: 4.25 Mil/uL (ref 3.87–5.11)
RDW: 14.6 % (ref 11.5–14.6)
WBC: 8.1 10*3/uL (ref 4.5–10.5)

## 2013-04-23 LAB — BASIC METABOLIC PANEL
BUN: 9 mg/dL (ref 6–23)
Chloride: 99 mEq/L (ref 96–112)
Glucose, Bld: 86 mg/dL (ref 70–99)
Potassium: 3.9 mEq/L (ref 3.5–5.1)

## 2013-04-23 LAB — HEPATIC FUNCTION PANEL
ALT: 11 U/L (ref 0–35)
AST: 16 U/L (ref 0–37)
Albumin: 3.7 g/dL (ref 3.5–5.2)
Alkaline Phosphatase: 56 U/L (ref 39–117)
Bilirubin, Direct: 0 mg/dL (ref 0.0–0.3)
Total Bilirubin: 0.4 mg/dL (ref 0.3–1.2)

## 2013-04-23 LAB — LIPID PANEL
Cholesterol: 180 mg/dL (ref 0–200)
LDL Cholesterol: 108 mg/dL — ABNORMAL HIGH (ref 0–99)
Total CHOL/HDL Ratio: 5
VLDL: 38.8 mg/dL (ref 0.0–40.0)

## 2013-04-23 LAB — HEMOGLOBIN A1C: Hgb A1c MFr Bld: 5.2 % (ref 4.6–6.5)

## 2013-04-23 LAB — TSH: TSH: 0.98 u[IU]/mL (ref 0.35–5.50)

## 2013-04-23 MED ORDER — HYDROCODONE-ACETAMINOPHEN 7.5-325 MG PO TABS: 1.0000 | ORAL_TABLET | Freq: Four times a day (QID) | ORAL | Status: DC | PRN

## 2013-04-23 MED ORDER — CYCLOBENZAPRINE HCL 10 MG PO TABS: 10.0000 mg | ORAL_TABLET | Freq: Three times a day (TID) | ORAL | Status: DC | PRN

## 2013-04-23 MED ORDER — ALPRAZOLAM 0.5 MG PO TABS: 0.5000 mg | ORAL_TABLET | Freq: Three times a day (TID) | ORAL | Status: DC | PRN

## 2013-04-23 NOTE — Progress Notes (Signed)
   Subjective:    Patient ID: [name], female    DOB: Feb 15, 1971, 42 y.o.   MRN: [id number]  HPI CPE- UTD on mammo.  GYN- Lily Peer   Review of Systems Patient reports no vision/ hearing changes, adenopathy,fever, weight change,  persistant/recurrent hoarseness , swallowing issues, chest pain, palpitations, edema, persistant/recurrent cough, hemoptysis, dyspnea (rest/exertional/paroxysmal nocturnal), gastrointestinal bleeding (melena, rectal bleeding), abdominal pain, significant heartburn, bowel changes, GU symptoms (dysuria, hematuria, incontinence), Gyn symptoms (abnormal  bleeding, pain),  syncope, focal weakness, memory loss, numbness & tingling, skin/hair/nail changes, abnormal bruising or bleeding, anxiety, or depression.     Objective:   Physical Exam General Appearance:    Alert, cooperative, no distress, appears stated age, obese  Head:    Normocephalic, without obvious abnormality, atraumatic  Eyes:    PERRL, conjunctiva/corneas clear, EOM's intact, fundi    benign, both eyes  Ears:    Normal TM's and external ear canals, both ears  Nose:   Nares normal, septum midline, mucosa normal, no drainage    or sinus tenderness  Throat:   Lips, mucosa, and tongue normal; teeth and gums normal  Neck:   Supple, symmetrical, trachea midline, no adenopathy;    Thyroid: no enlargement/tenderness/nodules  Back:     Symmetric, no curvature, ROM normal, no CVA tenderness  Lungs:     Clear to auscultation bilaterally, respirations unlabored  Chest Wall:    No tenderness or deformity   Heart:    Regular rate and rhythm, S1 and S2 normal, no murmur, rub   or gallop  Breast Exam:    Deferred to GYN  Abdomen:     Soft, non-tender, bowel sounds active all four quadrants,    no masses, no organomegaly  Genitalia:    Deferred to GYN  Rectal:    Extremities:   Extremities normal, atraumatic, no cyanosis or edema  Pulses:   2+ and symmetric all extremities  Skin:   Skin color, texture, turgor  normal, no rashes or lesions  Lymph nodes:   Cervical, supraclavicular, and axillary nodes normal  Neurologic:   CNII-XII intact, normal strength, sensation and reflexes    throughout          Assessment & Plan:

## 2013-04-23 NOTE — Assessment & Plan Note (Signed)
Pt's PE WNL w/ exception of obesity.  UTD on GYN, mammo.  Check labs.  Anticipatory guidance provided.

## 2013-04-23 NOTE — Patient Instructions (Signed)
Follow up in 6 months to recheck BP and cholesterol We'll notify you of your lab results and make any changes if needed Try and get regular exercise and make healthy food choices Call with any questions or concerns Happy Holidays!!!

## 2013-04-23 NOTE — Progress Notes (Signed)
Pre visit review using our clinic review tool, if applicable. No additional management support is needed unless otherwise documented below in the visit note. 

## 2013-04-23 NOTE — Assessment & Plan Note (Signed)
Encouraged regular, aerobic activity for 30 minutes at least 4x/week and monitoring caloric intake w/ the help of [employer]itness[employer] app.  

## 2013-04-26 ENCOUNTER — Other Ambulatory Visit: Payer: Self-pay | Admitting: General Practice

## 2013-04-26 LAB — VITAMIN D 1,25 DIHYDROXY
Vitamin D 1, 25 (OH)2 Total: 62 pg/mL (ref 18–72)
Vitamin D2 1, 25 (OH)2: <8 pg/mL
Vitamin D3 1, 25 (OH)2: 62 pg/mL

## 2013-04-26 MED ORDER — NEBIVOLOL HCL 5 MG PO TABS: 5.0000 mg | ORAL_TABLET | Freq: Every day | ORAL | Status: DC

## 2013-05-21 ENCOUNTER — Telehealth: Payer: Self-pay | Admitting: *Deleted

## 2013-05-21 DIAGNOSIS — G894 Chronic pain syndrome: Secondary | ICD-10-CM

## 2013-05-21 NOTE — Telephone Encounter (Signed)
Patient is requesting refill on hydrocodone. Last OV 04/23/13  Last filled 04/23/13 #60 UDS, low risk on 10/31/12 Okay to refill?

## 2013-05-22 ENCOUNTER — Other Ambulatory Visit: Payer: Self-pay | Admitting: Family Medicine

## 2013-05-22 MED ORDER — HYDROCODONE-ACETAMINOPHEN 7.5-325 MG PO TABS: 1.0000 | ORAL_TABLET | Freq: Four times a day (QID) | ORAL | Status: DC | PRN

## 2013-05-22 NOTE — Telephone Encounter (Signed)
Ok for #60, no refills 

## 2013-05-22 NOTE — Telephone Encounter (Signed)
Rx printed and placed on ledge for signature  

## 2013-05-22 NOTE — Telephone Encounter (Signed)
Spoke with patient and made aware that prescription was available for pick up.

## 2013-05-23 NOTE — Telephone Encounter (Signed)
Last OV 04-23-13 Med filled 12-28-12 #30 with 3

## 2013-05-23 NOTE — Telephone Encounter (Signed)
Med filled and faxed.  

## 2013-06-10 ENCOUNTER — Telehealth: Payer: Self-pay | Admitting: Family Medicine

## 2013-06-10 NOTE — Telephone Encounter (Signed)
Samples placed at front desk pt notified.

## 2013-06-10 NOTE — Telephone Encounter (Signed)
Patient called requesting samples of abilify. Her prescription will not arrive for 10 more days. Please advise. CB# [phone]

## 2013-06-10 NOTE — Telephone Encounter (Signed)
If we have them, ok to provide samples

## 2013-06-10 NOTE — Telephone Encounter (Signed)
Is it okay to give samples if we have them? Please advise.

## 2013-06-17 ENCOUNTER — Telehealth: Payer: Self-pay | Admitting: Family Medicine

## 2013-06-17 DIAGNOSIS — G894 Chronic pain syndrome: Secondary | ICD-10-CM

## 2013-06-17 MED ORDER — HYDROCODONE-ACETAMINOPHEN 7.5-325 MG PO TABS: 1.0000 | ORAL_TABLET | Freq: Four times a day (QID) | ORAL | Status: DC | PRN

## 2013-06-17 MED ORDER — ALPRAZOLAM 0.5 MG PO TABS: 0.5000 mg | ORAL_TABLET | Freq: Three times a day (TID) | ORAL | Status: DC | PRN

## 2013-06-17 NOTE — Telephone Encounter (Signed)
Last OV 04-23-13 Hydrocodone filled 05-22-13 #60 with 0 Alprazolam filled 04-23-13 #90 with 1

## 2013-06-17 NOTE — Telephone Encounter (Signed)
Ok for Alprazolam x3 Hydrocodone #60

## 2013-06-17 NOTE — Telephone Encounter (Signed)
Patient called requesting new rx for hydrocodone and alprazolam. She would like to pick this up at the same time she gets her parents. Call [phone] when ready for pick up.

## 2013-06-17 NOTE — Telephone Encounter (Signed)
Med filled.  

## 2013-06-18 ENCOUNTER — Other Ambulatory Visit: Payer: Self-pay | Admitting: Dermatology

## 2013-06-23 ENCOUNTER — Other Ambulatory Visit: Payer: Self-pay | Admitting: Family Medicine

## 2013-06-24 ENCOUNTER — Telehealth: Payer: Self-pay | Admitting: Family Medicine

## 2013-06-24 NOTE — Telephone Encounter (Signed)
Patient called stating her dermatologist (Dr Allyson Sabal) recommended that she start taking lasix 40 mg on a regular basis. Patient would like rx sent to  [facility]

## 2013-06-24 NOTE — Telephone Encounter (Signed)
Called pt and LMOVM informing that we cannot fill this medication because we have no idea why pt would be on this medication. She needs to contact derm office to have this filled or we need office notes informing the need.

## 2013-06-24 NOTE — Telephone Encounter (Signed)
Med filled.  

## 2013-06-28 ENCOUNTER — Other Ambulatory Visit: Payer: Self-pay | Admitting: General Practice

## 2013-06-28 MED ORDER — FUROSEMIDE 40 MG PO TABS: 40.0000 mg | ORAL_TABLET | Freq: Every day | ORAL | Status: DC

## 2013-07-15 ENCOUNTER — Telehealth: Payer: Self-pay | Admitting: Family Medicine

## 2013-07-15 DIAGNOSIS — G894 Chronic pain syndrome: Secondary | ICD-10-CM

## 2013-07-15 MED ORDER — HYDROCODONE-ACETAMINOPHEN 7.5-325 MG PO TABS: 1.0000 | ORAL_TABLET | Freq: Four times a day (QID) | ORAL | Status: DC | PRN

## 2013-07-15 NOTE — Telephone Encounter (Signed)
Last OV 04-23-13 Med filled 06-17-12 #60 with 0

## 2013-07-15 NOTE — Telephone Encounter (Signed)
Ok for #60- can refill each month if due (just not able to put refills on script) 

## 2013-07-15 NOTE — Telephone Encounter (Signed)
Med filled.  

## 2013-07-15 NOTE — Telephone Encounter (Signed)
Patient called and requested a refill for HYDROcodone-acetaminophen (NORCO) 7.5-325 MG per tablet

## 2013-07-22 ENCOUNTER — Telehealth: Payer: Self-pay | Admitting: Family Medicine

## 2013-07-22 NOTE — Telephone Encounter (Signed)
Patient called and stated that her Dr Eino Farber from [facility] suggested if Dr Birdie Riddle would prescribe [name] since we already have a relationship with [facility] and they do not use them. Also patient stated if she needed to come in she is willing to for Dr Birdie Riddle to write [name]. Please advise.

## 2013-07-22 NOTE — Telephone Encounter (Signed)
This medication really needs to be written by [facility] since it is a controlled substance and they are the ones writing it.  I do not feel comfortable writing a script for another provider.

## 2013-07-23 NOTE — Telephone Encounter (Signed)
Spoke with pt and advised that she needs to have [facility] contact [facility]abori to discuss.

## 2013-07-24 ENCOUNTER — Telehealth: Payer: Self-pay | Admitting: General Practice

## 2013-07-24 NOTE — Telephone Encounter (Signed)
Called pt and notified that we received her bystolic to the office. Placed at front desk for pick up.

## 2013-08-01 ENCOUNTER — Other Ambulatory Visit: Payer: Self-pay | Admitting: Family Medicine

## 2013-08-01 NOTE — Telephone Encounter (Signed)
Med filled.  

## 2013-08-07 ENCOUNTER — Other Ambulatory Visit: Payer: Self-pay | Admitting: Family Medicine

## 2013-08-08 NOTE — Telephone Encounter (Signed)
Med filled.  

## 2013-08-12 ENCOUNTER — Telehealth: Payer: Self-pay | Admitting: Family Medicine

## 2013-08-12 DIAGNOSIS — G894 Chronic pain syndrome: Secondary | ICD-10-CM

## 2013-08-12 MED ORDER — HYDROCODONE-ACETAMINOPHEN 7.5-325 MG PO TABS: 1.0000 | ORAL_TABLET | Freq: Four times a day (QID) | ORAL | Status: DC | PRN

## 2013-08-12 NOTE — Telephone Encounter (Signed)
08/12/2013  Pt is needing refill on rx HYDROcodone-acetaminophen ([place]) 7.5-325 MG.  Inform pt when complete.  Pt will be picking up parents rx's as well

## 2013-08-12 NOTE — Telephone Encounter (Signed)
Last OV:  04/23/13 Last Filled:  07/15/13; #60, 0 refills Contract on file UDS: 10/31/2012  Please advise.

## 2013-08-12 NOTE — Telephone Encounter (Signed)
Ok for #60 

## 2013-08-12 NOTE — Telephone Encounter (Signed)
Med filled and pt notified.  

## 2013-08-21 ENCOUNTER — Ambulatory Visit: Payer: BC Managed Care – PPO | Admitting: Family Medicine

## 2013-08-28 ENCOUNTER — Ambulatory Visit: Payer: BC Managed Care – PPO | Admitting: Family Medicine

## 2013-09-07 IMAGING — US US EXTREM LOW VENOUS*L*
1 series · 13 of 24 positions shown · non-contrast
Comparison: Bilateral lower extremity venous Doppler ultrasound
01/04/2007 [HOSPITAL].

CLINICAL DATA: Left lower extremity pain, edema, and erythema
located laterally just above the ankle.

LEFT LOWER EXTREMITY VENOUS DOPPLER ULTRASOUND
TECHNIQUE: Gray-scale sonography with compression, as well as
color and duplex Doppler ultrasound, were performed to evaluate the
deep venous system from the level of the common femoral vein
through the popliteal and proximal calf veins. Evaluation also
included physiologic response to augmentation.

[Series 1: us extrem low venous*left* · 13 of 31 slices shown]
[im 1/31]
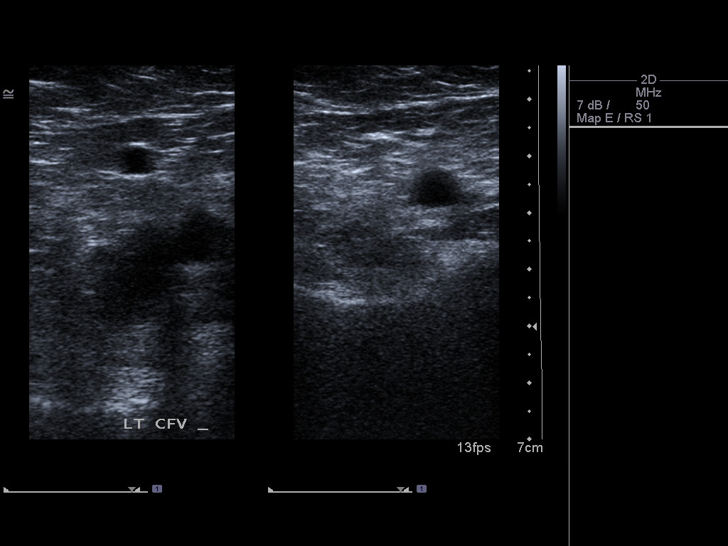
[im 3/31]
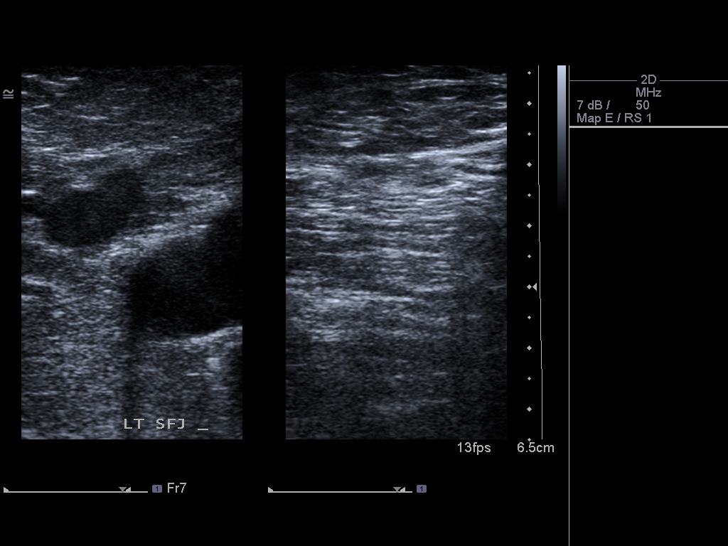
[im 6/31]
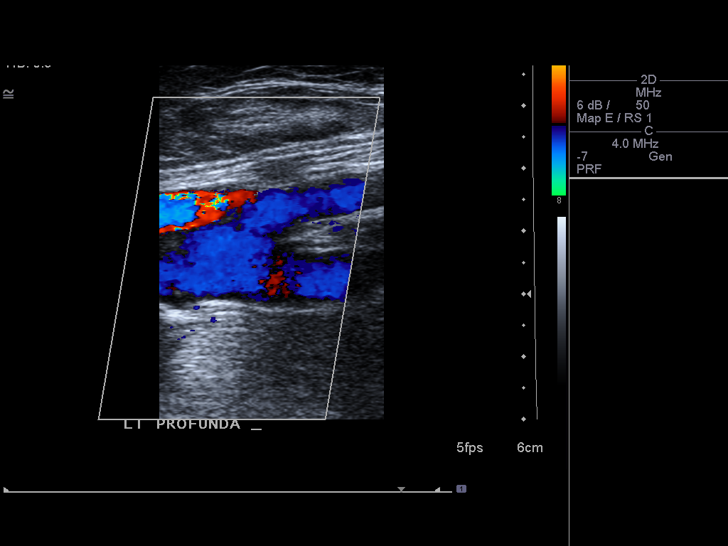
[im 8/31]
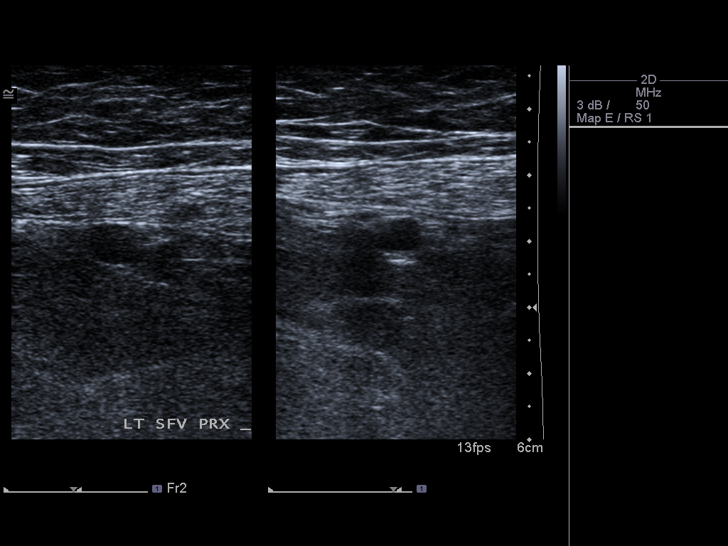
[im 11/31]
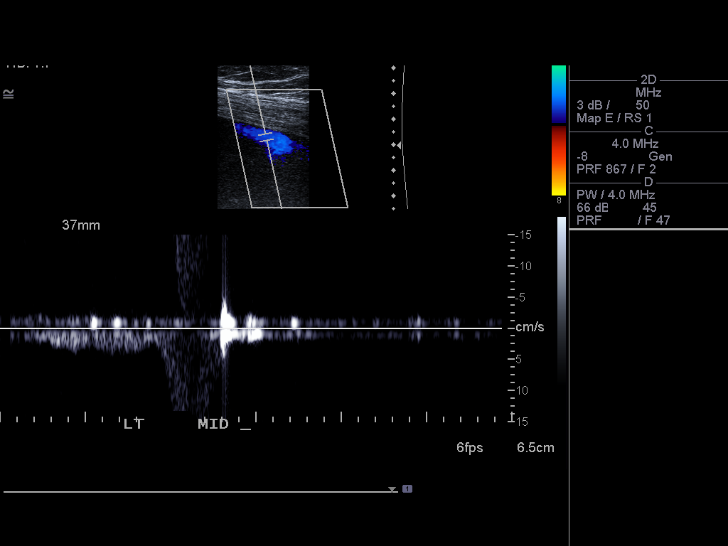
[im 14/31]
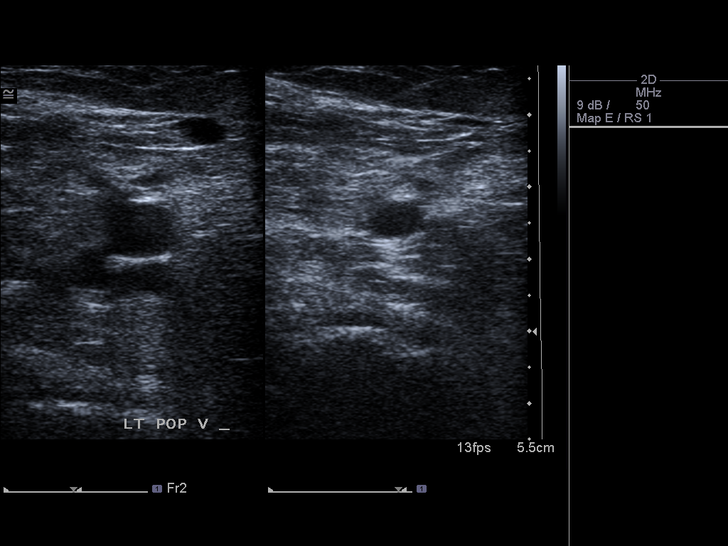
[im 16/31]
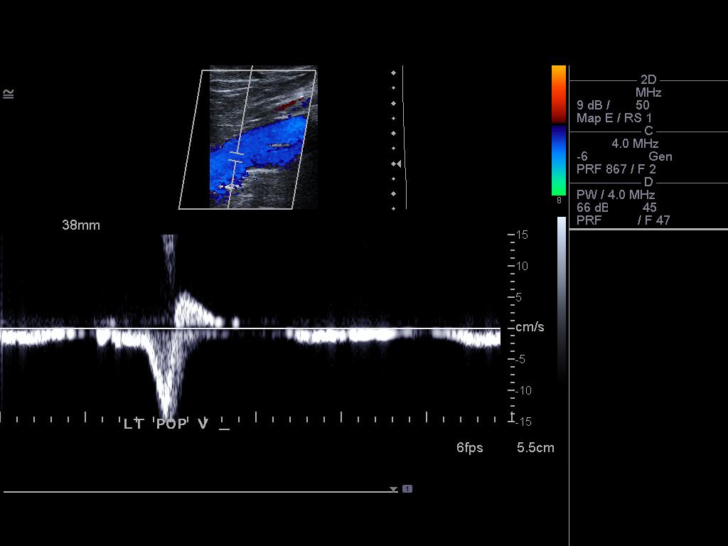
[im 17/31]
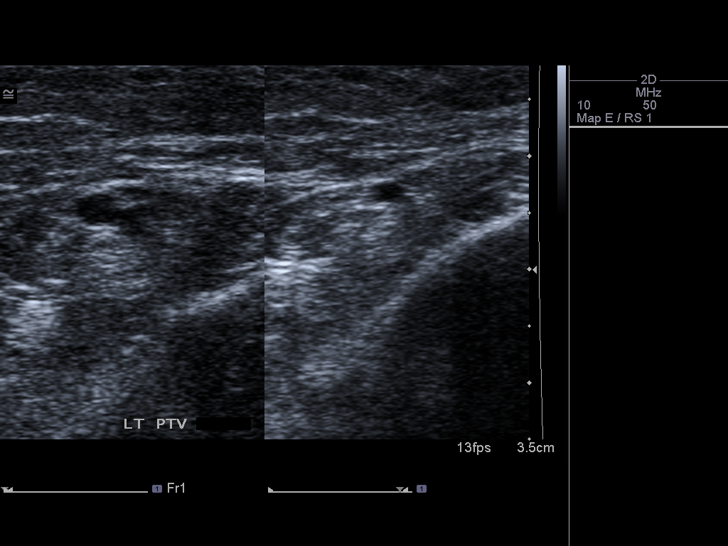
[im 20/31]
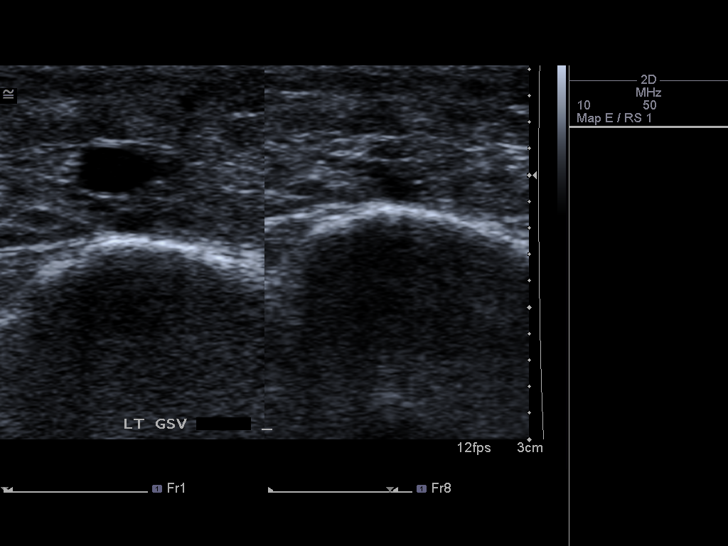
[im 23/31]
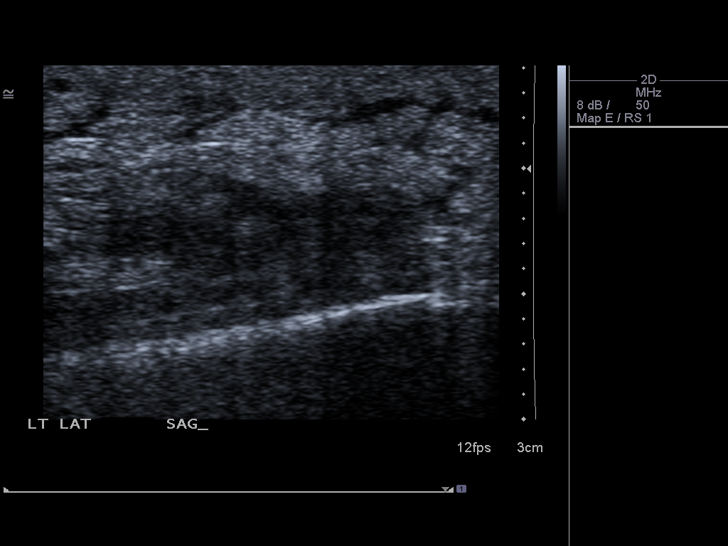
[im 25/31]
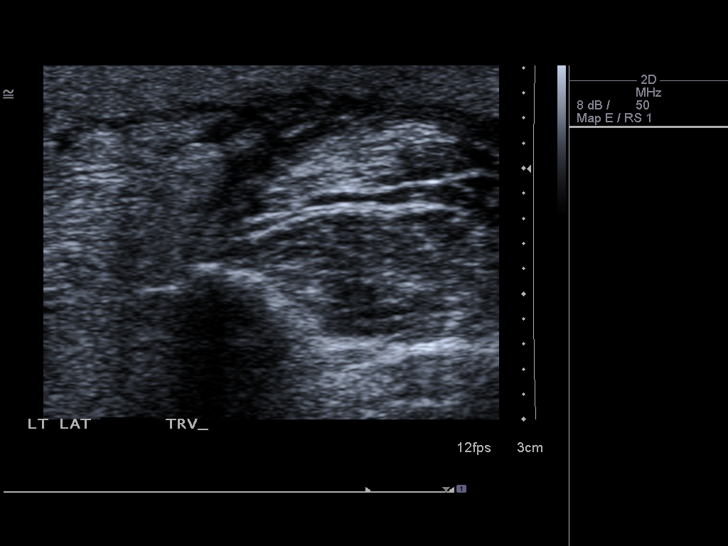
[im 28/31]
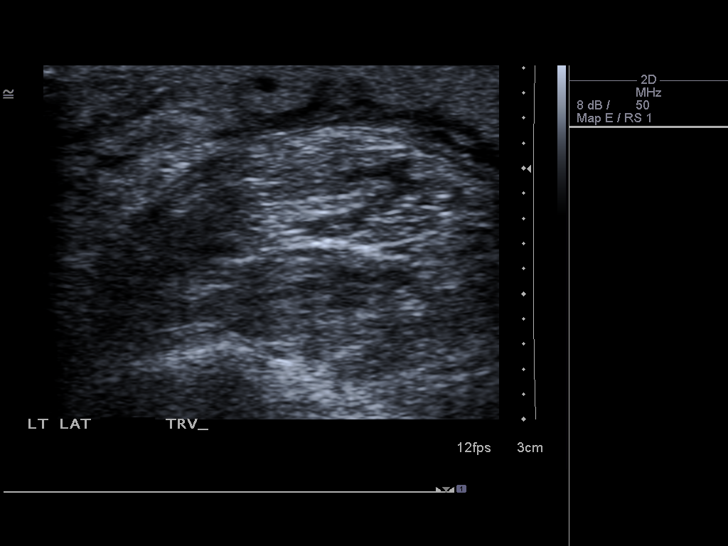
[im 31/31]
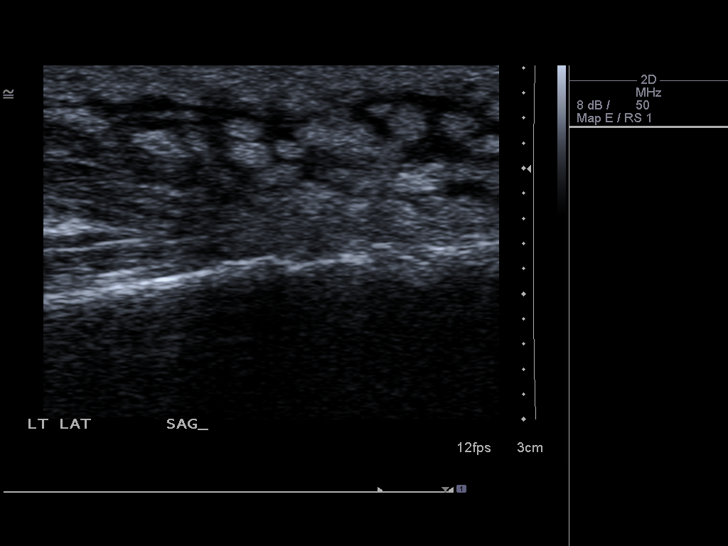

[13 of 24 positions shown; findings below may reference images not displayed]

FINDINGS: All of the visualized deep veins in the left lower
extremity demonstrate normal Doppler signal, normal
compressibility, normal phasicity, and normal physiologic response
to augmentation.  No gray scale filling defects were identified.
The greater saphenous vein is similarly patent through its
visualized course in the leg.  Note is made of edema involving the
subcutaneous tissues of the lateral lower leg in the area of edema
and erythema, but a discrete soft tissue abscess is not visualized.
IMPRESSION: 1.  No evidence of left lower extremity DVT or superficial
thrombophlebitis.
2.  Edema in the subcutaneous tissues at the site of erythema in
the lateral lower leg, but no evidence of soft tissue abscess.

## 2013-09-09 ENCOUNTER — Telehealth: Payer: Self-pay | Admitting: Family Medicine

## 2013-09-09 NOTE — Telephone Encounter (Signed)
Last vitamin d testing was completed in 04/2013. Per [name] the lab were normal. Please advise on the refill.

## 2013-09-09 NOTE — Telephone Encounter (Signed)
Caller name: Hana Relation to pt: Call back Jane Lew: [facility], [place], [place]  Reason for call: pt needs a new script for RX cyanocobalamin ((VITAMIN B-12)) injection 1,000 mcg and the needles.  Call pt when completed.

## 2013-09-09 NOTE — Telephone Encounter (Signed)
Spoke with pt. She advised she has an appt on Monday 5/4 and will get labs drawn at that time.

## 2013-09-09 NOTE — Telephone Encounter (Signed)
Will need B12 level prior to restarting injxns b/c last level was actually high

## 2013-09-13 ENCOUNTER — Ambulatory Visit: Payer: BC Managed Care – PPO | Admitting: Family Medicine

## 2013-09-16 ENCOUNTER — Encounter: Payer: Self-pay | Admitting: Family Medicine

## 2013-09-16 ENCOUNTER — Ambulatory Visit (INDEPENDENT_AMBULATORY_CARE_PROVIDER_SITE_OTHER): Payer: BC Managed Care – PPO | Admitting: Family Medicine

## 2013-09-16 ENCOUNTER — Other Ambulatory Visit: Payer: Self-pay | Admitting: General Practice

## 2013-09-16 VITALS — BP 120/78 | HR 87 | Temp 98.0°F | Resp 16 | Wt 300.0 lb

## 2013-09-16 DIAGNOSIS — G894 Chronic pain syndrome: Secondary | ICD-10-CM

## 2013-09-16 DIAGNOSIS — E669 Obesity, unspecified: Secondary | ICD-10-CM

## 2013-09-16 DIAGNOSIS — E785 Hyperlipidemia, unspecified: Secondary | ICD-10-CM

## 2013-09-16 DIAGNOSIS — I10 Essential (primary) hypertension: Secondary | ICD-10-CM

## 2013-09-16 LAB — TSH: TSH: 1.09 u[IU]/mL (ref 0.35–5.50)

## 2013-09-16 LAB — CBC WITH DIFFERENTIAL/PLATELET
BASOS ABS: 0.1 10*3/uL (ref 0.0–0.1)
Basophils Relative: 0.5 % (ref 0.0–3.0)
Eosinophils Absolute: 0.4 10*3/uL (ref 0.0–0.7)
Eosinophils Relative: 3.7 % (ref 0.0–5.0)
HEMATOCRIT: 38 % (ref 36.0–46.0)
HEMOGLOBIN: 12.5 g/dL (ref 12.0–15.0)
LYMPHS ABS: 2.6 10*3/uL (ref 0.7–4.0)
Lymphocytes Relative: 22.7 % (ref 12.0–46.0)
MCHC: 32.9 g/dL (ref 30.0–36.0)
MCV: 86 fl (ref 78.0–100.0)
Monocytes Absolute: 0.6 10*3/uL (ref 0.1–1.0)
Monocytes Relative: 5.6 % (ref 3.0–12.0)
NEUTROS ABS: 7.7 10*3/uL (ref 1.4–7.7)
Neutrophils Relative %: 67.5 % (ref 43.0–77.0)
Platelets: 385 10*3/uL (ref 150.0–400.0)
RBC: 4.43 Mil/uL (ref 3.87–5.11)
RDW: 14.4 % (ref 11.5–14.6)
WBC: 11.4 10*3/uL — ABNORMAL HIGH (ref 4.5–10.5)

## 2013-09-16 LAB — HEPATIC FUNCTION PANEL
ALK PHOS: 89 U/L (ref 39–117)
ALT: 14 U/L (ref 0–35)
AST: 22 U/L (ref 0–37)
Albumin: 3.8 g/dL (ref 3.5–5.2)
BILIRUBIN DIRECT: 0 mg/dL (ref 0.0–0.3)
BILIRUBIN TOTAL: 0.1 mg/dL — AB (ref 0.2–1.2)
Total Protein: 7.4 g/dL (ref 6.0–8.3)

## 2013-09-16 LAB — BASIC METABOLIC PANEL
BUN: 11 mg/dL (ref 6–23)
CHLORIDE: 102 meq/L (ref 96–112)
CO2: 27 meq/L (ref 19–32)
Calcium: 9.2 mg/dL (ref 8.4–10.5)
Creatinine, Ser: 0.8 mg/dL (ref 0.4–1.2)
GFR: 86.64 mL/min (ref >60.00)
GLUCOSE: 85 mg/dL (ref 70–99)
POTASSIUM: 4 meq/L (ref 3.5–5.1)
SODIUM: 137 meq/L (ref 135–145)

## 2013-09-16 LAB — LIPID PANEL
CHOLESTEROL: 180 mg/dL (ref 0–200)
HDL: 30.2 mg/dL — ABNORMAL LOW (ref >39.00)
LDL Cholesterol: 103 mg/dL — ABNORMAL HIGH (ref 0–99)
Total CHOL/HDL Ratio: 6
Triglycerides: 234 mg/dL — ABNORMAL HIGH (ref 0.0–149.0)
VLDL: 46.8 mg/dL — ABNORMAL HIGH (ref 0.0–40.0)

## 2013-09-16 MED ORDER — HYDROCODONE-ACETAMINOPHEN 7.5-325 MG PO TABS: 1.0000 | ORAL_TABLET | Freq: Four times a day (QID) | ORAL | Status: DC | PRN

## 2013-09-16 NOTE — Progress Notes (Signed)
Pre visit review using our clinic review tool, if applicable. No additional management support is needed unless otherwise documented below in the visit note. 

## 2013-09-16 NOTE — Assessment & Plan Note (Signed)
Chronic problem, well controlled today.  Asymptomatic.  Check labs.  No anticipated med changes. 

## 2013-09-16 NOTE — Assessment & Plan Note (Signed)
Chronic problem.  Tolerating statin w/o difficulty.  Check labs.  Adjust meds prn  

## 2013-09-16 NOTE — Patient Instructions (Signed)
Schedule your complete physical for December We'll notify you of your lab results and fax them to psych Call with any questions or concerns Elbert Ewings in there!!!

## 2013-09-16 NOTE — Assessment & Plan Note (Signed)
Chronic problem.  Pt's weight loss has stalled due to inability to exercise (lower extremity cellulitis).  Has just recently started to lose weight again.  Will continue to monitor.

## 2013-09-16 NOTE — Progress Notes (Signed)
   Subjective:    Patient ID: [name], female    DOB: 06-26-1970, 43 y.o.   MRN: [id number]  HPI HTN- chronic problem, well controlled today on Lasix, Bystolic.  No CP, SOB, HAs, visual changes, edema.  Hyperlipidemia- chronic problem, on Crestor.  No abd pain, N/V, myalgias.  Obesity- s/p gastric bypass.  Has been off leg x3 months due to chronic leg infection.  Following regularly w/ [facility].  Has just started losing weight again due to immobility improving.   Review of Systems For ROS see HPI     Objective:   Physical Exam  Vitals reviewed. Constitutional: She is oriented to person, place, and time. She appears well-developed and well-nourished. No distress.  obesity  HENT:  Head: Normocephalic and atraumatic.  Eyes: Conjunctivae and EOM are normal. Pupils are equal, round, and reactive to light.  Neck: Normal range of motion. Neck supple. No thyromegaly present.  Cardiovascular: Normal rate, regular rhythm, normal heart sounds and intact distal pulses.   No murmur heard. Pulmonary/Chest: Effort normal and breath sounds normal. No respiratory distress.  Abdominal: Soft. She exhibits no distension. There is no tenderness.  Musculoskeletal: She exhibits no edema.  Lymphadenopathy:    She has no cervical adenopathy.  Neurological: She is alert and oriented to person, place, and time.  Skin: Skin is warm and dry.  Psychiatric: She has a normal mood and affect. Her behavior is normal.          Assessment & Plan:

## 2013-09-17 ENCOUNTER — Encounter: Payer: Self-pay | Admitting: General Practice

## 2013-09-17 ENCOUNTER — Telehealth: Payer: Self-pay | Admitting: Family Medicine

## 2013-09-17 NOTE — Telephone Encounter (Signed)
Relevant patient education assigned to patient using Emmi. ° °

## 2013-09-23 ENCOUNTER — Telehealth: Payer: Self-pay | Admitting: *Deleted

## 2013-09-23 NOTE — Telephone Encounter (Signed)
Called and told [name] we had samples for her of Cymbalta.  Told her they would be in the drawer at the front desk.  bw

## 2013-09-25 ENCOUNTER — Other Ambulatory Visit: Payer: Self-pay | Admitting: Family Medicine

## 2013-10-10 ENCOUNTER — Telehealth: Payer: Self-pay

## 2013-10-10 NOTE — Telephone Encounter (Signed)
Called patient and made her aware that her Bystolic is available to for pick up. She stated "ok".  Medication placed up at the front desk, ready for pick up.

## 2013-10-14 ENCOUNTER — Telehealth: Payer: Self-pay | Admitting: Family Medicine

## 2013-10-14 DIAGNOSIS — G894 Chronic pain syndrome: Secondary | ICD-10-CM

## 2013-10-14 MED ORDER — HYDROCODONE-ACETAMINOPHEN 7.5-325 MG PO TABS: 1.0000 | ORAL_TABLET | Freq: Four times a day (QID) | ORAL | Status: DC | PRN

## 2013-10-14 NOTE — Telephone Encounter (Signed)
Med filled.  

## 2013-10-14 NOTE — Telephone Encounter (Signed)
Caller name: Cleora Relation to GN:OIBB Call back [phone]   Reason for call: pt wants a refill on rx HYDROcodone-acetaminophen (NORCO) 7.5-325 MG per tablet

## 2013-10-29 ENCOUNTER — Telehealth: Payer: Self-pay | Admitting: Family Medicine

## 2013-10-29 MED ORDER — FUROSEMIDE 40 MG PO TABS: 40.0000 mg | ORAL_TABLET | Freq: Every day | ORAL | Status: DC

## 2013-10-29 NOTE — Telephone Encounter (Signed)
Med filled and pt notified.  

## 2013-10-29 NOTE — Telephone Encounter (Signed)
Caller name: Alyn Relation to GN:FAOZ  Call back [place] on [facility]  Reason for call:  Pt needs refill on RX furosemide (LASIX) 40 MG tablet .  Please send to [facility]

## 2013-10-30 ENCOUNTER — Telehealth: Payer: Self-pay | Admitting: Family Medicine

## 2013-10-30 DIAGNOSIS — R609 Edema, unspecified: Secondary | ICD-10-CM

## 2013-10-30 DIAGNOSIS — Z9884 Bariatric surgery status: Secondary | ICD-10-CM

## 2013-10-30 MED ORDER — FUROSEMIDE 40 MG PO TABS: 60.0000 mg | ORAL_TABLET | Freq: Every day | ORAL | Status: DC

## 2013-10-30 NOTE — Telephone Encounter (Signed)
Ok to increase to 60mg  daily (1.5 tabs daily) but needs to schedule lab visit in 1-2 weeks for BMP (dx diuretic use, edema)

## 2013-10-30 NOTE — Telephone Encounter (Signed)
Pt stated that she spoke with derm who recommended increasing her diuretic due to her leg pain. Pt stated that they were deferring to you. Please advise. She does not have money for a copay to be seen.

## 2013-10-30 NOTE — Telephone Encounter (Signed)
Caller name: Alonnah  Call back number:[phone]   Reason for call:   Pt does not have insurance now and has some questions for Dr. Virgil Benedict CMA, please call back.

## 2013-10-30 NOTE — Telephone Encounter (Signed)
Med filled and pt notified.  

## 2013-11-06 ENCOUNTER — Encounter: Payer: Self-pay | Admitting: Family Medicine

## 2013-11-06 ENCOUNTER — Other Ambulatory Visit: Payer: Self-pay | Admitting: Family Medicine

## 2013-11-06 ENCOUNTER — Ambulatory Visit (INDEPENDENT_AMBULATORY_CARE_PROVIDER_SITE_OTHER): Payer: BC Managed Care – PPO | Admitting: Family Medicine

## 2013-11-06 ENCOUNTER — Ambulatory Visit (HOSPITAL_BASED_OUTPATIENT_CLINIC_OR_DEPARTMENT_OTHER)
Admission: RE | Admit: 2013-11-06 | Discharge: 2013-11-06 | Disposition: A | Discharge status: Home / Self Care | Payer: BC Managed Care – PPO | Source: Ambulatory Visit | Attending: Family Medicine | Admitting: Family Medicine

## 2013-11-06 VITALS — BP 130/78 | HR 80 | Temp 98.0°F | Resp 16 | Wt 300.5 lb

## 2013-11-06 DIAGNOSIS — R700 Elevated erythrocyte sedimentation rate: Secondary | ICD-10-CM

## 2013-11-06 DIAGNOSIS — M79609 Pain in unspecified limb: Secondary | ICD-10-CM | POA: Insufficient documentation

## 2013-11-06 DIAGNOSIS — M79606 Pain in leg, unspecified: Secondary | ICD-10-CM

## 2013-11-06 DIAGNOSIS — G894 Chronic pain syndrome: Secondary | ICD-10-CM

## 2013-11-06 DIAGNOSIS — M7989 Other specified soft tissue disorders: Secondary | ICD-10-CM | POA: Insufficient documentation

## 2013-11-06 LAB — VITAMIN B12: Vitamin B-12: 1100 pg/mL — ABNORMAL HIGH (ref 211–911)

## 2013-11-06 LAB — SEDIMENTATION RATE: SED RATE: 64 mm/h — AB (ref 0–22)

## 2013-11-06 MED ORDER — HYDROCODONE-ACETAMINOPHEN 7.5-325 MG PO TABS: 1.0000 | ORAL_TABLET | Freq: Four times a day (QID) | ORAL | Status: DC | PRN

## 2013-11-06 NOTE — Progress Notes (Signed)
Pre visit review using our clinic review tool, if applicable. No additional management support is needed unless otherwise documented below in the visit note. 

## 2013-11-06 NOTE — Progress Notes (Signed)
   Subjective:    Patient ID: [name], female    DOB: 1971/03/30, 43 y.o.   MRN: [id number]  HPI Leg pain- bilateral leg pain from 'knee down'.  'it feels like they're bruised'.  Took Klonopin that was given for RLS, pain meds, 'nothing helped'.  Pain is constant.  sxs have been 'real bad' for 5-7 days.  On Cymbalta 120mg  daily.   Review of Systems For ROS see HPI     Objective:   Physical Exam  Vitals reviewed. Constitutional: She is oriented to person, place, and time. She appears well-developed and well-nourished. No distress.  Cardiovascular: Intact distal pulses.   Musculoskeletal: She exhibits edema (nonpitting edema bilaterally) and tenderness (over lower legs bilaterally, R lower leg, just below knee w/ palpable cord).  Neurological: She is alert and oriented to person, place, and time.  Skin: Skin is warm and dry. There is erythema (mild, overlying palpable cord on R lower leg).          Assessment & Plan:

## 2013-11-06 NOTE — Patient Instructions (Signed)
Follow up as needed We'll notify you of your lab results and make any changes if needed ICE and elevate legs as able If no clot, we'll refer you to Rheumatology Call with any questions or concerns Hang in there!!

## 2013-11-07 LAB — ANA: ANA: NEGATIVE

## 2013-11-08 ENCOUNTER — Encounter: Payer: Self-pay | Admitting: Family Medicine

## 2013-11-10 NOTE — Assessment & Plan Note (Signed)
Chronic problem.  But pt now w/ palpable cord in R lower leg.  Get doppler to assess for DVT.  If no clot, will proceed w/ Rheum referral.  Pt expressed understanding and is in agreement w/ plan.

## 2013-11-27 ENCOUNTER — Telehealth: Payer: Self-pay | Admitting: Family Medicine

## 2013-11-27 ENCOUNTER — Other Ambulatory Visit: Payer: BC Managed Care – PPO

## 2013-11-27 MED ORDER — GABAPENTIN 600 MG PO TABS: 600.0000 mg | ORAL_TABLET | Freq: Three times a day (TID) | ORAL | Status: DC

## 2013-11-27 NOTE — Telephone Encounter (Signed)
Med filled and pt notified.  

## 2013-11-27 NOTE — Telephone Encounter (Signed)
Caller name:Nakhia Relation to DU:PBDHDIX Call back number: [phone] Pharmacy:[facility] [place], [place] - [address]   Reason for call: Patient called to request a refill for gabapentin (NEURONTIN) 600 MG tablet

## 2013-11-27 NOTE — Telephone Encounter (Signed)
A user error has taken place.

## 2013-12-02 ENCOUNTER — Other Ambulatory Visit: Payer: Self-pay | Admitting: General Practice

## 2013-12-02 ENCOUNTER — Telehealth: Payer: Self-pay | Admitting: Family Medicine

## 2013-12-02 ENCOUNTER — Other Ambulatory Visit: Payer: Self-pay | Admitting: Family Medicine

## 2013-12-02 DIAGNOSIS — G894 Chronic pain syndrome: Secondary | ICD-10-CM

## 2013-12-02 MED ORDER — CYANOCOBALAMIN 1000 MCG/ML IJ KIT: 1.0000 mL | PACK | INTRAMUSCULAR | Status: DC

## 2013-12-02 MED ORDER — HYDROCODONE-ACETAMINOPHEN 7.5-325 MG PO TABS: 1.0000 | ORAL_TABLET | Freq: Four times a day (QID) | ORAL | Status: DC | PRN

## 2013-12-02 NOTE — Telephone Encounter (Signed)
[place] for #60, no refills No refill on B12 at this time since it is above normal limits.  No need.

## 2013-12-02 NOTE — Telephone Encounter (Signed)
Med filled, pt notified.

## 2013-12-02 NOTE — Telephone Encounter (Signed)
Last OV 11-06-13 Med filled 11-06-13 #60 with 0  Last labs state B12 is higher than normal, please advise.

## 2013-12-02 NOTE — Telephone Encounter (Signed)
Last ov 11-06-13 Med filled 05-22-13 #30 with 3

## 2013-12-02 NOTE — Telephone Encounter (Signed)
Med filled and faxed.  

## 2013-12-02 NOTE — Telephone Encounter (Signed)
Caller name: [name] Relation to pt: self  Call back [phone]   Reason for call:   Pt is needing a refill on RX HYDROcodone-acetaminophen ([place]) 7.5-325 MG per tablet for everyone.    Calling for self Mother : 4.18.1937 jenniferlynn, saad Father : 11.18.1935 Basia, Mcginty   Needs all 3 scripts.  Can pick up tomorrow during lab appt.    Pt, [name], is also n eeding a script for b12.

## 2013-12-03 ENCOUNTER — Other Ambulatory Visit (INDEPENDENT_AMBULATORY_CARE_PROVIDER_SITE_OTHER): Payer: BC Managed Care – PPO

## 2013-12-03 DIAGNOSIS — R609 Edema, unspecified: Secondary | ICD-10-CM

## 2013-12-03 LAB — BASIC METABOLIC PANEL
BUN: 9 mg/dL (ref 6–23)
CHLORIDE: 103 meq/L (ref 96–112)
CO2: 25 meq/L (ref 19–32)
Calcium: 8.9 mg/dL (ref 8.4–10.5)
Creatinine, Ser: 0.8 mg/dL (ref 0.4–1.2)
GFR: 85.28 mL/min (ref >60.00)
Glucose, Bld: 94 mg/dL (ref 70–99)
POTASSIUM: 3.8 meq/L (ref 3.5–5.1)
SODIUM: 135 meq/L (ref 135–145)

## 2013-12-20 ENCOUNTER — Other Ambulatory Visit: Payer: Self-pay | Admitting: Family Medicine

## 2013-12-20 NOTE — Telephone Encounter (Signed)
Med filled.  

## 2013-12-24 ENCOUNTER — Other Ambulatory Visit: Payer: Self-pay

## 2013-12-24 MED ORDER — PROMETHAZINE HCL 25 MG PO TABS: 25.0000 mg | ORAL_TABLET | Freq: Four times a day (QID) | ORAL | Status: DC | PRN

## 2014-01-01 ENCOUNTER — Other Ambulatory Visit: Payer: Self-pay | Admitting: General Practice

## 2014-01-01 DIAGNOSIS — G894 Chronic pain syndrome: Secondary | ICD-10-CM

## 2014-01-01 MED ORDER — HYDROCODONE-ACETAMINOPHEN 7.5-325 MG PO TABS: 1.0000 | ORAL_TABLET | Freq: Four times a day (QID) | ORAL | Status: DC | PRN

## 2014-01-08 ENCOUNTER — Other Ambulatory Visit: Payer: Self-pay | Admitting: General Practice

## 2014-01-08 ENCOUNTER — Ambulatory Visit (INDEPENDENT_AMBULATORY_CARE_PROVIDER_SITE_OTHER): Payer: No Typology Code available for payment source | Admitting: Family Medicine

## 2014-01-08 ENCOUNTER — Encounter: Payer: Self-pay | Admitting: Family Medicine

## 2014-01-08 ENCOUNTER — Telehealth: Payer: Self-pay | Admitting: General Practice

## 2014-01-08 VITALS — BP 126/76 | HR 77 | Temp 98.4°F | Resp 16 | Wt 294.1 lb

## 2014-01-08 DIAGNOSIS — R143 Flatulence: Secondary | ICD-10-CM

## 2014-01-08 DIAGNOSIS — M25569 Pain in unspecified knee: Secondary | ICD-10-CM

## 2014-01-08 DIAGNOSIS — R140 Abdominal distension (gaseous): Secondary | ICD-10-CM | POA: Insufficient documentation

## 2014-01-08 DIAGNOSIS — R141 Gas pain: Secondary | ICD-10-CM

## 2014-01-08 DIAGNOSIS — M545 Low back pain, unspecified: Secondary | ICD-10-CM

## 2014-01-08 DIAGNOSIS — M79669 Pain in unspecified lower leg: Secondary | ICD-10-CM

## 2014-01-08 DIAGNOSIS — Z0120 Encounter for dental examination and cleaning without abnormal findings: Secondary | ICD-10-CM

## 2014-01-08 DIAGNOSIS — R142 Eructation: Secondary | ICD-10-CM

## 2014-01-08 DIAGNOSIS — M79609 Pain in unspecified limb: Secondary | ICD-10-CM

## 2014-01-08 DIAGNOSIS — K219 Gastro-esophageal reflux disease without esophagitis: Secondary | ICD-10-CM

## 2014-01-08 LAB — COMPREHENSIVE METABOLIC PANEL
ALT: 16 U/L (ref 0–35)
AST: 21 U/L (ref 0–37)
Albumin: 3.9 g/dL (ref 3.5–5.2)
Alkaline Phosphatase: 50 U/L (ref 39–117)
BILIRUBIN TOTAL: 0.2 mg/dL (ref 0.2–1.2)
BUN: 13 mg/dL (ref 6–23)
CALCIUM: 9.1 mg/dL (ref 8.4–10.5)
CHLORIDE: 99 meq/L (ref 96–112)
CO2: 27 meq/L (ref 19–32)
CREATININE: 0.9 mg/dL (ref 0.4–1.2)
GFR: 74.16 mL/min (ref >60.00)
GLUCOSE: 116 mg/dL — AB (ref 70–99)
Potassium: 4.1 mEq/L (ref 3.5–5.1)
Sodium: 136 mEq/L (ref 135–145)
Total Protein: 7.6 g/dL (ref 6.0–8.3)

## 2014-01-08 LAB — SEDIMENTATION RATE: Sed Rate: 39 mm/hr — ABNORMAL HIGH (ref 0–22)

## 2014-01-08 LAB — URIC ACID: Uric Acid, Serum: 5.4 mg/dL (ref 2.4–7.0)

## 2014-01-08 LAB — CBC
HEMATOCRIT: 38.3 % (ref 36.0–46.0)
HEMOGLOBIN: 12.3 g/dL (ref 12.0–15.0)
MCHC: 32 g/dL (ref 30.0–36.0)
MCV: 83.8 fl (ref 78.0–100.0)
PLATELETS: 422 10*3/uL — AB (ref 150.0–400.0)
RBC: 4.57 Mil/uL (ref 3.87–5.11)
RDW: 16.4 % — ABNORMAL HIGH (ref 11.5–15.5)
WBC: 14 10*3/uL — AB (ref 4.0–10.5)

## 2014-01-08 MED ORDER — OMEPRAZOLE 40 MG PO CPDR: 40.0000 mg | DELAYED_RELEASE_CAPSULE | Freq: Every day | ORAL | Status: DC

## 2014-01-08 MED ORDER — ALPRAZOLAM 0.5 MG PO TABS: 0.5000 mg | ORAL_TABLET | Freq: Three times a day (TID) | ORAL | Status: DC | PRN

## 2014-01-08 MED ORDER — NEEDLES & SYRINGES MISC: Status: DC

## 2014-01-08 NOTE — Assessment & Plan Note (Signed)
New.  Suspect this is due to prednisone use and increased GERD.  Will refer pt's labs.  Reviewed supportive care and red flags that should prompt return.  Pt expressed understanding and is in agreement w/ plan.

## 2014-01-08 NOTE — Progress Notes (Signed)
Pre visit review using our clinic review tool, if applicable. No additional management support is needed unless otherwise documented below in the visit note. 

## 2014-01-08 NOTE — Telephone Encounter (Signed)
Pt called and stated that she needs a referral to [facility]. States that per [facility] card it has to come from you.

## 2014-01-08 NOTE — Telephone Encounter (Signed)
Ok to refer for routine dental care

## 2014-01-08 NOTE — Patient Instructions (Signed)
Follow up as needed We'll notify you and Dr Charlestine Night of your lab results Start the Omeprazole once daily Make sure you are taking the Prednisone w/ food Gas-X as needed for pain and bloating Drink plenty of water Call with any questions or concerns Hang in there!

## 2014-01-08 NOTE — Addendum Note (Signed)
Addended by: Modena Morrow D on: 01/08/2014 01:42 PM   Modules accepted: Orders

## 2014-01-08 NOTE — Progress Notes (Signed)
   Subjective:    Patient ID: [name], female    DOB: [birth year], 43 y.o.   MRN: [id number]  HPI Upper abd pain- pain is bilaterally, somewhat relieved w/ grabbing under ribs/breasts bilaterally.  Pt has temporary relief w/ antacids but sxs return.  No relation to food.  No nausea.  + bloating.  Has sensation of needing to belch or fart but unable.  Pt currently on prednisone.  + GERD- not new for pt but much worse than previous.   Review of Systems For ROS see HPI     Objective:   Physical Exam  Vitals reviewed. Constitutional: She appears well-developed and well-nourished. No distress.  obese  HENT:  Head: Normocephalic and atraumatic.  Cardiovascular: Normal rate, regular rhythm, normal heart sounds and intact distal pulses.   Pulmonary/Chest: Effort normal and breath sounds normal. No respiratory distress. She has no wheezes. She has no rales.  Abdominal: Soft. Bowel sounds are normal. She exhibits no distension. There is tenderness (mild LUQ TTP). There is no rebound and no guarding.  Musculoskeletal: She exhibits no edema.  Skin: Skin is warm and dry.  Psychiatric: She has a normal mood and affect. Her behavior is normal. Thought content normal.          Assessment & Plan:

## 2014-01-08 NOTE — Assessment & Plan Note (Signed)
New.  Start PPI while pt is on Prednisone.  Reviewed dietary and lifestyle modifications.  Reviewed supportive care and red flags that should prompt return.  Pt expressed understanding and is in agreement w/ plan.

## 2014-01-08 NOTE — Telephone Encounter (Signed)
Referral placed.

## 2014-01-30 ENCOUNTER — Telehealth: Payer: Self-pay | Admitting: Family Medicine

## 2014-01-30 DIAGNOSIS — G894 Chronic pain syndrome: Secondary | ICD-10-CM

## 2014-01-30 MED ORDER — HYDROCODONE-ACETAMINOPHEN 7.5-325 MG PO TABS: 1.0000 | ORAL_TABLET | Freq: Four times a day (QID) | ORAL | Status: DC | PRN

## 2014-01-30 NOTE — Telephone Encounter (Signed)
Last ov 01-08-14  Med filled 01-01-14 #60 with 0

## 2014-01-30 NOTE — Telephone Encounter (Signed)
Caller name: Chieko Relation to pt: Call back number:[phone]   Reason for call:  Pt is needing refill on Rx HYDROcodone-acetaminophen (NORCO) 7.5-325 MG per tablet   Also needs for mother, Evanie Buckle, 09/01/1935. Rx HYDROcodone-acetaminophen (NORCO) 7.5-325 MG per tablet

## 2014-01-30 NOTE — Telephone Encounter (Signed)
Ok for #60 

## 2014-02-03 ENCOUNTER — Telehealth: Payer: Self-pay | Admitting: Family Medicine

## 2014-02-03 NOTE — Telephone Encounter (Signed)
Noted chart updated to reflect.

## 2014-02-03 NOTE — Telephone Encounter (Signed)
Caller name: Andrea Relation to pt: self Call back number: [phone] Pharmacy:  Reason for call:   Patient states that she is now using [facility] [phone]

## 2014-02-21 ENCOUNTER — Other Ambulatory Visit: Payer: Self-pay | Admitting: Family Medicine

## 2014-02-21 NOTE — Telephone Encounter (Signed)
Med filled.  

## 2014-02-25 ENCOUNTER — Ambulatory Visit (INDEPENDENT_AMBULATORY_CARE_PROVIDER_SITE_OTHER): Payer: No Typology Code available for payment source | Admitting: Family Medicine

## 2014-02-25 ENCOUNTER — Encounter: Payer: Self-pay | Admitting: Family Medicine

## 2014-02-25 ENCOUNTER — Telehealth: Payer: Self-pay | Admitting: Family Medicine

## 2014-02-25 VITALS — BP 124/80 | HR 71 | Temp 98.1°F | Resp 16 | Wt 299.1 lb

## 2014-02-25 DIAGNOSIS — M1A09X0 Idiopathic chronic gout, multiple sites, without tophus (tophi): Secondary | ICD-10-CM

## 2014-02-25 DIAGNOSIS — G894 Chronic pain syndrome: Secondary | ICD-10-CM

## 2014-02-25 DIAGNOSIS — R609 Edema, unspecified: Secondary | ICD-10-CM

## 2014-02-25 LAB — BASIC METABOLIC PANEL
BUN: 7 mg/dL (ref 6–23)
CALCIUM: 9.3 mg/dL (ref 8.4–10.5)
CHLORIDE: 103 meq/L (ref 96–112)
CO2: 26 meq/L (ref 19–32)
Creatinine, Ser: 0.9 mg/dL (ref 0.4–1.2)
GFR: 71.3 mL/min (ref >60.00)
Glucose, Bld: 80 mg/dL (ref 70–99)
Potassium: 3.9 mEq/L (ref 3.5–5.1)
SODIUM: 137 meq/L (ref 135–145)

## 2014-02-25 MED ORDER — HYDROCODONE-ACETAMINOPHEN 7.5-325 MG PO TABS: 1.0000 | ORAL_TABLET | Freq: Four times a day (QID) | ORAL | Status: DC | PRN

## 2014-02-25 MED ORDER — PREDNISONE 10 MG PO TABS: ORAL_TABLET | ORAL | Status: DC

## 2014-02-25 MED ORDER — ALLOPURINOL 100 MG PO TABS: 100.0000 mg | ORAL_TABLET | Freq: Two times a day (BID) | ORAL | Status: DC

## 2014-02-25 MED ORDER — METHYLPREDNISOLONE ACETATE 80 MG/ML IJ SUSP
80.0000 mg | Freq: Once | INTRAMUSCULAR | Status: AC
  Administered 2014-02-25: 80 mg via INTRAMUSCULAR

## 2014-02-25 NOTE — Telephone Encounter (Signed)
Chart updated to reflect and pt notified.

## 2014-02-25 NOTE — Assessment & Plan Note (Signed)
New.  Pt has seen Dr Charlestine Night who thinks that pt's cutaneous redness, swelling, and tenderness are due to soft tissue gout.  Pt reports the only time she had any relief is when she was on Prednisone.  No improvement w/ abx- so this argues against cellulitic infxn.  No relief w/ pain meds.  No improvement on lasix.  Will increase allopurinol dose.  Pt to get depo-medrol in office and start pred taper tomorrow.  F/u w/ rheum later this week.  Pt expressed understanding and is in agreement w/ plan.

## 2014-02-25 NOTE — Patient Instructions (Signed)
Follow up as needed We'll notify you of your lab results Increase Allopurinol to twice daily Start the Prednisone tomorrow AM- take w/ food Follow up w/ Dr Charlestine Night this week Call with any questions or concerns Hang in there!

## 2014-02-25 NOTE — Assessment & Plan Note (Signed)
Chronic problem.  Minimal symptoms today.  Due to Lasix use, check BMP.  Adjust meds prn based on renal fxn.

## 2014-02-25 NOTE — Telephone Encounter (Signed)
Continue Allopurinol 300mg  daily

## 2014-02-25 NOTE — Progress Notes (Signed)
   Subjective:    Patient ID: [name], female    DOB: June 30, 1970, 43 y.o.   MRN: [id number]  HPI Leg swelling and redness- L>R.  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  Pt having increased redness and swelling.  No relief w/ pain meds, on lasix.  Completed prednisone- 'this was the only thing that helped'.  Currently on 100mg  Allopurinol daily.  On 60mg  lasix daily.  Pt has not responded previously to abx and has had bx w/ derm.  Pt is very frustrated by recurrence and lack of resolution.  No fevers.   Review of Systems For ROS see HPI     Objective:   Physical Exam  Vitals reviewed. Constitutional: She appears well-developed and well-nourished. No distress.  Cardiovascular: Intact distal pulses.   Musculoskeletal: She exhibits edema (trace-1+ edema bilaterally) and tenderness (over bilateral lower LEs and ankles bilaterally).  Skin: Skin is warm and dry. There is erythema (w/ skin discoloration nearly circumferentially around L lower leg but no obvious cellulitis.  + TTP).          Assessment & Plan:

## 2014-02-25 NOTE — Progress Notes (Signed)
Pre visit review using our clinic review tool, if applicable. No additional management support is needed unless otherwise documented below in the visit note. 

## 2014-02-25 NOTE — Telephone Encounter (Signed)
Caller name: Genevia  Relation to pt: self  Call back number: [phone]   Reason for call: pt states she made a mistake allopurinol (ZYLOPRIM) 100 MG tablet should be 300 MG instead of 100MG 

## 2014-03-06 ENCOUNTER — Telehealth: Payer: Self-pay | Admitting: *Deleted

## 2014-03-06 ENCOUNTER — Encounter: Payer: Self-pay | Admitting: *Deleted

## 2014-03-06 NOTE — Telephone Encounter (Signed)
Patient dropped off forms that are needed for financial assistance for her medication. An updated letter was printed for patient as well stating her annual income is 0.00. Forms and letter forwarded to Dr. Birdie Riddle for review/signature. JG//CMA

## 2014-03-10 NOTE — Telephone Encounter (Signed)
Received completed/signed forms back from Dr. Birdie Riddle. Called patient and she requested that we mail the forms to her. Forms mailed. JG//CMA

## 2014-03-17 ENCOUNTER — Encounter: Payer: Self-pay | Admitting: Family Medicine

## 2014-03-18 ENCOUNTER — Other Ambulatory Visit: Payer: Self-pay | Admitting: Family Medicine

## 2014-03-18 NOTE — Telephone Encounter (Signed)
Med filled.  

## 2014-03-25 ENCOUNTER — Telehealth: Payer: Self-pay

## 2014-03-25 DIAGNOSIS — G894 Chronic pain syndrome: Secondary | ICD-10-CM

## 2014-03-25 MED ORDER — HYDROCODONE-ACETAMINOPHEN 7.5-325 MG PO TABS: 1.0000 | ORAL_TABLET | Freq: Four times a day (QID) | ORAL | Status: DC | PRN

## 2014-03-25 NOTE — Telephone Encounter (Signed)
Med filled per verbal ok. Pt notified it will be ready tomorrow.

## 2014-03-25 NOTE — Telephone Encounter (Signed)
[name] [phone]  She needs a refill on her HYDROcodone-acetaminophen ([place]) 7.5-325 MG per tablet, call when ready. She is [employer] and would like to pick up then if possible.

## 2014-04-14 ENCOUNTER — Other Ambulatory Visit: Payer: Self-pay | Admitting: Family Medicine

## 2014-04-14 ENCOUNTER — Telehealth: Payer: Self-pay | Admitting: *Deleted

## 2014-04-14 ENCOUNTER — Telehealth: Payer: Self-pay | Admitting: Family Medicine

## 2014-04-14 MED ORDER — DULOXETINE HCL 60 MG PO CPEP: 120.0000 mg | ORAL_CAPSULE | Freq: Every day | ORAL | Status: DC

## 2014-04-14 NOTE — Telephone Encounter (Signed)
Caller name: Tynslee Relation to pt: self Call back number: [phone] [facility]  Reason for call:   Patient is requesting a refill of generic cymbalta. She states that she is out. Patient would like a callback when sent.

## 2014-04-14 NOTE — Telephone Encounter (Signed)
Received fax from [employer] for Crestor 20mg  to assist patient with cost of medication. Forms forwarded to Dr. Birdie Riddle. JG//CMA

## 2014-04-14 NOTE — Telephone Encounter (Signed)
Med filled and pt notified.  

## 2014-04-14 NOTE — Telephone Encounter (Signed)
Med filled.  

## 2014-04-21 NOTE — Telephone Encounter (Signed)
[employer] application approved for a 12 month period and will be shipped to our office in approximately 4 weeks. Approval letter sent for scanning. JG//CMA

## 2014-04-23 ENCOUNTER — Telehealth: Payer: Self-pay | Admitting: Family Medicine

## 2014-04-23 DIAGNOSIS — G894 Chronic pain syndrome: Secondary | ICD-10-CM

## 2014-04-23 MED ORDER — HYDROCODONE-ACETAMINOPHEN 7.5-325 MG PO TABS: 1.0000 | ORAL_TABLET | Freq: Four times a day (QID) | ORAL | Status: DC | PRN

## 2014-04-23 NOTE — Telephone Encounter (Signed)
Med filled and pt notified.  

## 2014-04-23 NOTE — Telephone Encounter (Signed)
Caller name: [name] Relation to pt: self Call back number: [phone] Pharmacy:  Reason for call:   Requesting a new hydrocodone rx

## 2014-04-28 ENCOUNTER — Telehealth: Payer: Self-pay | Admitting: Family Medicine

## 2014-04-28 NOTE — Telephone Encounter (Signed)
Caller name: Kleigh Relation to pt: self Call back number: [phone] Pharmacy:  Reason for call:   Patient is wanting to know if cymbalta has come in for her? She states that she is out.

## 2014-04-29 NOTE — Telephone Encounter (Signed)
This has not been delivered to me.

## 2014-04-30 NOTE — Telephone Encounter (Signed)
Left message for patient to return my call.

## 2014-04-30 NOTE — Telephone Encounter (Signed)
Pt stated medication should be arriving Friday, 05/02/14

## 2014-04-30 NOTE — Telephone Encounter (Signed)
Pt gets this from Pt assistance. We never order it for her. I cn call it to the local pharmacy if she would like but she will have to pay for it.

## 2014-04-30 NOTE — Telephone Encounter (Signed)
Did you order this for the patient??

## 2014-05-05 ENCOUNTER — Other Ambulatory Visit: Payer: Self-pay | Admitting: Family Medicine

## 2014-05-05 NOTE — Telephone Encounter (Signed)
Med filled.  

## 2014-05-06 ENCOUNTER — Telehealth: Payer: Self-pay | Admitting: Family Medicine

## 2014-05-06 NOTE — Telephone Encounter (Signed)
Caller name: Raymonde, Hamblin Relation to pt: self  Call back number: [phone] Pharmacy:  [address] [place], [place] [phone] (Phone     Reason for call:  Pt requesting a refill DULoxetine (CYMBALTA) 60 MG capsule states medication is to expensive requesting a cheaper brand to hold her over until medication is received thru mail older.

## 2014-05-06 NOTE — Telephone Encounter (Signed)
There is no cheaper version of Cymbalta but we can start generic Effexor XR 150mg  daily while waiting for meds

## 2014-05-07 NOTE — Telephone Encounter (Signed)
Pt notified states that she picked up a 10 day supply of the cymbalta. Will call back if her medicine does not come in to have the effexor filled.

## 2014-05-14 ENCOUNTER — Telehealth: Payer: Self-pay | Admitting: Family Medicine

## 2014-05-14 NOTE — Telephone Encounter (Signed)
Caller name: Allegra Relation to pt: self Call back number: [phone] Pharmacy:  Reason for call:   Patient wants Jessice to call [facility] to check on status of cymbalta.

## 2014-05-14 NOTE — Telephone Encounter (Signed)
Called [facility] [phone] and their office was closed for the holidays. Advised pt that if her medication did not arrive by next week, I will call back after the holidays.

## 2014-05-20 ENCOUNTER — Telehealth: Payer: Self-pay

## 2014-05-20 NOTE — Telephone Encounter (Signed)
[name] [phone]  [name] has another question

## 2014-05-20 NOTE — Telephone Encounter (Signed)
Called and contacted [employer] for pt. They stated that the medication was sent to our old office, since no one was there to sign for it it was sent back to the company. [facility] has not received the medication and is not sure when pt should receive the new prescription. Called and notified pt of this and also let pt know that someone from [facility] should be contacting her.

## 2014-05-21 ENCOUNTER — Telehealth: Payer: Self-pay | Admitting: *Deleted

## 2014-05-21 NOTE — Telephone Encounter (Signed)
Returned patient call.  Patient stated that she is not a candidate for the wound care center due to wounds being open and is [facility].  Dr. Birdie Riddle notified.

## 2014-05-21 NOTE — Telephone Encounter (Signed)
Received medical record request via mail from [facility]. Forwarded to health port. JG//CMA

## 2014-05-23 ENCOUNTER — Telehealth: Payer: Self-pay | Admitting: Family Medicine

## 2014-05-23 DIAGNOSIS — G894 Chronic pain syndrome: Secondary | ICD-10-CM

## 2014-05-23 MED ORDER — HYDROCODONE-ACETAMINOPHEN 7.5-325 MG PO TABS: 1.0000 | ORAL_TABLET | Freq: Four times a day (QID) | ORAL | Status: DC | PRN

## 2014-05-23 NOTE — Telephone Encounter (Signed)
Med printed per verbal from provider.

## 2014-05-23 NOTE — Telephone Encounter (Signed)
Caller name: Shahana, Capes Relation to pt: self  Call back number: [phone]   Reason for call:  Pt requesting a refill HYDROcodone-acetaminophen (NORCO) 7.5-325 MG per tablet

## 2014-05-26 ENCOUNTER — Telehealth: Payer: Self-pay | Admitting: *Deleted

## 2014-05-26 NOTE — Telephone Encounter (Signed)
Received fax from [facility] for patient cost assistance for the following: Bystolic 5 mg, Crestor 20 mg, Cymbalta 60 mg, Trilipix 135 mg. Forwarded to Dr. Birdie Riddle. JG//CMA

## 2014-05-27 ENCOUNTER — Telehealth: Payer: Self-pay | Admitting: General Practice

## 2014-05-27 DIAGNOSIS — M25569 Pain in unspecified knee: Secondary | ICD-10-CM

## 2014-05-27 NOTE — Telephone Encounter (Signed)
Ok for referral to [facility]

## 2014-05-27 NOTE — Telephone Encounter (Signed)
Pt called the office stating that she has seen rheumatology and dermatology about her legs and is not a candidate for wound care center due to wound not being open. Pt would like to have a referral to [facility]. [place] for referral?

## 2014-05-27 NOTE — Telephone Encounter (Signed)
Referral placed and pt notified

## 2014-05-29 ENCOUNTER — Other Ambulatory Visit: Payer: Self-pay | Admitting: Family Medicine

## 2014-05-29 NOTE — Telephone Encounter (Signed)
Med filled and faxed.  

## 2014-05-29 NOTE — Telephone Encounter (Signed)
Last OV 02/25/14 Clonazepam last filled 7/20 #30 with 3

## 2014-05-30 NOTE — Telephone Encounter (Signed)
Closed at @4  today, will call Monday morning

## 2014-05-30 NOTE — Telephone Encounter (Signed)
Can you check on the status of this referral 

## 2014-05-30 NOTE — Telephone Encounter (Signed)
Completed forms faxed to [facility] at [phone] successfully. JG//CMA

## 2014-06-02 NOTE — Telephone Encounter (Signed)
Noted  

## 2014-06-02 NOTE — Telephone Encounter (Signed)
Per [facility], still in review

## 2014-06-09 ENCOUNTER — Other Ambulatory Visit: Payer: Self-pay | Admitting: Family Medicine

## 2014-06-09 NOTE — Telephone Encounter (Signed)
Med filled and faxed.  

## 2014-06-09 NOTE — Telephone Encounter (Signed)
Last OV 02-25-14 Alprazolam last filled 04-14-14 #90 with 1

## 2014-06-11 ENCOUNTER — Telehealth: Payer: Self-pay | Admitting: Family Medicine

## 2014-06-11 NOTE — Telephone Encounter (Signed)
Spoke with [name] who advised that someone from our office gave her the wrong fax number 2 days ago and pt's claim is pending this paperwork that we have not received. [name] is resending this paperwork with my name at the top just to ensure we receive it. This message was routed to the [place] who handle papers JG, for further processing.

## 2014-06-11 NOTE — Telephone Encounter (Signed)
The lady at [facility] has the wrong fax number and will not accept the correct fax number from the patient.  Please call [phone] opt [phone] and her name is [name]

## 2014-06-12 NOTE — Telephone Encounter (Signed)
Requested medical records faxed to Medicaid. JG//CMA

## 2014-06-19 ENCOUNTER — Telehealth: Payer: Self-pay | Admitting: Family Medicine

## 2014-06-19 DIAGNOSIS — G894 Chronic pain syndrome: Secondary | ICD-10-CM

## 2014-06-19 MED ORDER — HYDROCODONE-ACETAMINOPHEN 7.5-325 MG PO TABS: 1.0000 | ORAL_TABLET | Freq: Four times a day (QID) | ORAL | Status: DC | PRN

## 2014-06-19 NOTE — Telephone Encounter (Signed)
Hydrocodone 5-750  Needs written rx

## 2014-06-19 NOTE — Telephone Encounter (Signed)
Med filled.  

## 2014-06-23 ENCOUNTER — Telehealth: Payer: Self-pay | Admitting: Family Medicine

## 2014-06-23 NOTE — Telephone Encounter (Signed)
According to Dr Elmon Else note, pt is supposed to be on Prednisone 10mg  daily until she sees another provider.  I don't think he planned for it to take 3+ months.  [place] for Prednisone 10mg  daily, #30, no refills.  At that time, will attempt to decrease to 5mg  daily.

## 2014-06-23 NOTE — Telephone Encounter (Signed)
Left a message for call back.  

## 2014-06-23 NOTE — Telephone Encounter (Signed)
Caller name: Darika  Relation to pt: self Call back number: [phone] [facility]  Reason for call:   Patient states that she has questions regarding the prednisone that she is on. Patient states that she will run out of prednisone on 2/18 and the appointment with [place] rheumatology is not until 5/12. Will need refills.

## 2014-06-23 NOTE — Telephone Encounter (Signed)
Pt would like refills to last her until her visit with Rheumatology on the 09/25/14.  States that without prednisone her legs swell so much so that she's not able to get around on them.  Pt states she has enough to last her until 07/03/14.  Please advise.  Pt states she's willing to come in for an appointment if needed.

## 2014-06-24 MED ORDER — PREDNISONE 10 MG PO TABS
ORAL_TABLET | ORAL | Status: DC
Start: 2014-06-24 — End: 2014-08-06

## 2014-06-24 NOTE — Telephone Encounter (Signed)
Rx sent to pharmacy.  Pt notified and made aware.  Dr. Virgil Benedict recommendation for an attempt to decrease dosage after 1 month was also discussed with patient.  Pt stated understanding and agreed with plan.

## 2014-07-09 ENCOUNTER — Other Ambulatory Visit: Payer: Self-pay | Admitting: Family Medicine

## 2014-07-09 NOTE — Telephone Encounter (Signed)
Med filled.  

## 2014-07-14 ENCOUNTER — Telehealth: Payer: Self-pay | Admitting: Family Medicine

## 2014-07-14 DIAGNOSIS — G894 Chronic pain syndrome: Secondary | ICD-10-CM

## 2014-07-14 NOTE — Telephone Encounter (Signed)
What medication does she need

## 2014-07-14 NOTE — Telephone Encounter (Signed)
Going to [facility] in may for her legs.  She runs out of meds 2 or 3 days prior to the end of the month.  What do you want her to do

## 2014-07-16 MED ORDER — HYDROCODONE-ACETAMINOPHEN 7.5-325 MG PO TABS: 1.0000 | ORAL_TABLET | Freq: Four times a day (QID) | ORAL | Status: DC | PRN

## 2014-07-16 NOTE — Telephone Encounter (Signed)
Last OV 02-25-14 Hydrocodone last filled 06/19/14 #60 with 0

## 2014-07-16 NOTE — Telephone Encounter (Signed)
Ok to refill hydrocodone #60.  My guess is she means that she will run out of prednisone.  If she calls when it is due, we can refill

## 2014-07-16 NOTE — Addendum Note (Signed)
Addended by: Kris Hartmann on: 07/16/2014 04:38 PM   Modules accepted: Orders

## 2014-07-16 NOTE — Telephone Encounter (Signed)
Requesting HYDROcodone-acetaminophen (NORCO) 7.5-325 MG per tablet please advise. Best [phone]

## 2014-07-16 NOTE — Telephone Encounter (Signed)
Med filled. Pt notified. Will call when needs refills.

## 2014-07-27 ENCOUNTER — Other Ambulatory Visit: Payer: Self-pay | Admitting: Family Medicine

## 2014-07-28 NOTE — Telephone Encounter (Signed)
Med filled.  

## 2014-07-29 ENCOUNTER — Telehealth: Payer: Self-pay | Admitting: Family Medicine

## 2014-07-29 DIAGNOSIS — G8929 Other chronic pain: Secondary | ICD-10-CM

## 2014-07-29 DIAGNOSIS — M549 Dorsalgia, unspecified: Principal | ICD-10-CM

## 2014-07-29 NOTE — Telephone Encounter (Signed)
Patient states she is taking the prednisone for pain (soft-tissue gout) and that the 10mg  is barely helping, so she is worried about going down to 5.   For X-ray: she is going to court for disability.  Her mother was recently diagnosed with spinal stenosis and is in a lot of back pain.  She was advised to find out if this could be potential cause of her back pain (she was wondering if it is hereditary).   She states she is willing to go with whatever plan Dr. Birdie Riddle thinks is best, just requesting.

## 2014-07-29 NOTE — Telephone Encounter (Signed)
Caller name:Rock, Otila Kluver K Relation to pt: self  Call back number:[phone]   Reason for call:  Pt would like to discuss not reducing predniSONE (DELTASONE) 10 MG tablet to 5MG  and pt states disability is requesting an order for x ray of her back.

## 2014-07-29 NOTE — Telephone Encounter (Signed)
Patient notified.  X-ray placed.

## 2014-07-29 NOTE — Telephone Encounter (Signed)
I'm not sure who told pt that she needs to decrease to 5mg .  I am willing to fill her Prednisone 10mg  until she has her appt at [facility] and then they will manage her meds.  Long for xray of Lumbar spine- dx chronic back pain

## 2014-07-29 NOTE — Telephone Encounter (Signed)
Could you find out why pt does not want to decrease the prednisone? And also why she needs an xray of her back?

## 2014-08-05 ENCOUNTER — Other Ambulatory Visit: Payer: Self-pay | Admitting: General Practice

## 2014-08-05 ENCOUNTER — Other Ambulatory Visit: Payer: Self-pay | Admitting: Family Medicine

## 2014-08-05 DIAGNOSIS — G894 Chronic pain syndrome: Secondary | ICD-10-CM

## 2014-08-05 MED ORDER — HYDROCODONE-ACETAMINOPHEN 7.5-325 MG PO TABS: 1.0000 | ORAL_TABLET | Freq: Four times a day (QID) | ORAL | Status: DC | PRN

## 2014-08-05 NOTE — Telephone Encounter (Signed)
Last OV 02-25-14  Hydrocodone last filled 07-16-14 #60 with 0

## 2014-08-05 NOTE — Telephone Encounter (Signed)
[place] for #60, 0 refills

## 2014-08-05 NOTE — Telephone Encounter (Signed)
Rx printed (awaiting Dr. Darene Lamer signature when she returns from [place]).

## 2014-08-05 NOTE — Telephone Encounter (Signed)
Med filled and faxed.  

## 2014-08-05 NOTE — Telephone Encounter (Signed)
Last OV 02-25-14 Alprazolam last filled 06-09-14 #90 with 1

## 2014-08-06 ENCOUNTER — Other Ambulatory Visit: Payer: Self-pay | Admitting: Family Medicine

## 2014-08-06 NOTE — Telephone Encounter (Signed)
Med filled and pt notified, available at front desk for pick up.

## 2014-08-06 NOTE — Telephone Encounter (Signed)
Med filled.  

## 2014-08-12 ENCOUNTER — Ambulatory Visit (HOSPITAL_BASED_OUTPATIENT_CLINIC_OR_DEPARTMENT_OTHER)
Admission: RE | Admit: 2014-08-12 | Discharge: 2014-08-12 | Disposition: A | Discharge status: Home / Self Care | Payer: No Typology Code available for payment source | Source: Ambulatory Visit | Attending: Family Medicine | Admitting: Family Medicine

## 2014-08-12 DIAGNOSIS — M5186 Other intervertebral disc disorders, lumbar region: Secondary | ICD-10-CM | POA: Insufficient documentation

## 2014-08-12 DIAGNOSIS — G8929 Other chronic pain: Secondary | ICD-10-CM

## 2014-08-12 DIAGNOSIS — M549 Dorsalgia, unspecified: Secondary | ICD-10-CM

## 2014-08-12 DIAGNOSIS — M545 Low back pain: Secondary | ICD-10-CM | POA: Insufficient documentation

## 2014-09-02 ENCOUNTER — Telehealth: Payer: Self-pay | Admitting: Family Medicine

## 2014-09-02 DIAGNOSIS — G894 Chronic pain syndrome: Secondary | ICD-10-CM

## 2014-09-02 MED ORDER — HYDROCODONE-ACETAMINOPHEN 7.5-325 MG PO TABS: 1.0000 | ORAL_TABLET | Freq: Four times a day (QID) | ORAL | Status: DC | PRN

## 2014-09-02 NOTE — Telephone Encounter (Signed)
Ok for #60 

## 2014-09-02 NOTE — Telephone Encounter (Signed)
Med filled and pt notified.  

## 2014-09-02 NOTE — Telephone Encounter (Signed)
Caller name:Makilah Amyx Relationship to patient:self Can be [phone]   Reason for call:  Pt requesting refill of HYDROcodone-acetaminophen (NORCO) 7.5-325 MG per tablet [[id number]]   Last OV: 02/25/14 Last refill: 08/05/14

## 2014-09-09 ENCOUNTER — Other Ambulatory Visit: Payer: Self-pay | Admitting: General Practice

## 2014-09-09 MED ORDER — CYCLOBENZAPRINE HCL 10 MG PO TABS: 10.0000 mg | ORAL_TABLET | Freq: Three times a day (TID) | ORAL | Status: DC | PRN

## 2014-09-17 ENCOUNTER — Other Ambulatory Visit: Payer: Self-pay | Admitting: General Practice

## 2014-09-17 MED ORDER — FUROSEMIDE 40 MG PO TABS: ORAL_TABLET | ORAL | Status: DC

## 2014-09-25 DIAGNOSIS — M25569 Pain in unspecified knee: Secondary | ICD-10-CM | POA: Diagnosis not present

## 2014-09-25 DIAGNOSIS — M793 Panniculitis, unspecified: Secondary | ICD-10-CM | POA: Diagnosis not present

## 2014-09-29 ENCOUNTER — Telehealth: Payer: Self-pay | Admitting: Family Medicine

## 2014-09-29 DIAGNOSIS — G894 Chronic pain syndrome: Secondary | ICD-10-CM

## 2014-09-29 MED ORDER — HYDROCODONE-ACETAMINOPHEN 7.5-325 MG PO TABS
1.0000 | ORAL_TABLET | Freq: Four times a day (QID) | ORAL | Status: DC | PRN
Start: 2014-09-29 — End: 2014-10-27

## 2014-09-29 NOTE — Telephone Encounter (Signed)
BRINGING DAD FOR AN APPT ON wEDNESDAY AND WOULD LIKE TO PICK UP HER HYDROCODONE 7.5 AT THAT TIME

## 2014-09-29 NOTE — Telephone Encounter (Signed)
Med filled. Pt notified to schedule CPE.

## 2014-09-29 NOTE — Telephone Encounter (Signed)
[name] for #60 but needs to schedule appt

## 2014-09-29 NOTE — Telephone Encounter (Signed)
Last OV 02/2014, pt overdue for CPE (due December 2015) Hydrocodone last filled 4/19 #60 with 0

## 2014-10-14 ENCOUNTER — Other Ambulatory Visit: Payer: Self-pay | Admitting: Family Medicine

## 2014-10-14 NOTE — Telephone Encounter (Signed)
Med filled.  

## 2014-10-20 ENCOUNTER — Other Ambulatory Visit: Payer: Self-pay | Admitting: General Practice

## 2014-10-20 MED ORDER — CLONAZEPAM 1 MG PO TABS: ORAL_TABLET | ORAL | Status: DC

## 2014-10-20 NOTE — Telephone Encounter (Signed)
Last OV 02-25-14 (CPE 02-28-15) Clonazepam last filled 05-19-14 #30 with 3

## 2014-10-21 ENCOUNTER — Telehealth: Payer: Self-pay | Admitting: Family Medicine

## 2014-10-21 NOTE — Telephone Encounter (Signed)
Caller name: Patria Warzecha Relationship to patient: self Can be reached: [phone] Pharmacy:  Reason for call: Pt states that she has a disability trial coming up 12/30/14. She needs to know if she has to come in to see Dr. Birdie Riddle before then. Her last appt was 02/2014 and next is scheduled for 02/2015.

## 2014-10-21 NOTE — Telephone Encounter (Signed)
Since i am not testifying nor providing information at the trial, there is no need to see me based on that reason.  If she has other issues or concerns, I would be happy to see her

## 2014-10-22 NOTE — Telephone Encounter (Signed)
Pt notified and expressed an understanding.  

## 2014-10-27 ENCOUNTER — Other Ambulatory Visit: Payer: Self-pay | Admitting: Family Medicine

## 2014-10-27 DIAGNOSIS — G894 Chronic pain syndrome: Secondary | ICD-10-CM

## 2014-10-27 MED ORDER — HYDROCODONE-ACETAMINOPHEN 7.5-325 MG PO TABS: 1.0000 | ORAL_TABLET | Freq: Four times a day (QID) | ORAL | Status: DC | PRN

## 2014-10-27 NOTE — Telephone Encounter (Signed)
Med filled and pt notified available at front desk for pick up.  

## 2014-10-27 NOTE — Telephone Encounter (Signed)
Caller name: Isha Relation to WY:SHUO Call back number: [phone] Pharmacy:   Reason for call: Pt is requesting rx for HYDROcodone-acetaminophen (NORCO) 7.5-325 MG per tablet. Please advise.

## 2014-10-27 NOTE — Telephone Encounter (Signed)
Last OV 02/2014, Cpe in October 2016.  Hydrocodone last filled 09/29/14 #60 with 0

## 2014-11-11 ENCOUNTER — Other Ambulatory Visit: Payer: Self-pay | Admitting: General Practice

## 2014-11-11 MED ORDER — ALPRAZOLAM 0.5 MG PO TABS: ORAL_TABLET | ORAL | Status: DC

## 2014-11-11 NOTE — Telephone Encounter (Signed)
Med filled and faxed.  

## 2014-11-11 NOTE — Telephone Encounter (Signed)
Last OV 02-25-14 Alprazolam last filled 08-05-14 #90 with 1

## 2014-11-14 DIAGNOSIS — R600 Localized edema: Secondary | ICD-10-CM | POA: Diagnosis not present

## 2014-11-14 DIAGNOSIS — M25569 Pain in unspecified knee: Secondary | ICD-10-CM | POA: Diagnosis not present

## 2014-11-14 DIAGNOSIS — L309 Dermatitis, unspecified: Secondary | ICD-10-CM | POA: Diagnosis not present

## 2014-11-18 ENCOUNTER — Telehealth: Payer: Self-pay | Admitting: Family Medicine

## 2014-11-18 DIAGNOSIS — G894 Chronic pain syndrome: Secondary | ICD-10-CM

## 2014-11-18 MED ORDER — HYDROCODONE-ACETAMINOPHEN 7.5-325 MG PO TABS: 1.0000 | ORAL_TABLET | Freq: Four times a day (QID) | ORAL | Status: DC | PRN

## 2014-11-18 NOTE — Telephone Encounter (Signed)
Last OV 02-25-14 Hydrocodone last filled 10-27-14 #60 with 0  Low risk, due for UDS

## 2014-11-18 NOTE — Telephone Encounter (Signed)
Caller name: Yeimy Relation to pt: self Call back number: [phone] Pharmacy:  Reason for call:   Requesting to pick up hydrocodone rx early. She states that she will not fill until next fill date. She doesn't want to have to come back to [place] twice.

## 2014-11-18 NOTE — Telephone Encounter (Signed)
Ok for #60 

## 2014-11-18 NOTE — Telephone Encounter (Signed)
Med filled and pt informed ready at front desk for pick up.

## 2014-11-19 ENCOUNTER — Other Ambulatory Visit: Payer: Self-pay | Admitting: Family Medicine

## 2014-11-19 NOTE — Telephone Encounter (Signed)
Med filled.  

## 2014-11-28 DIAGNOSIS — M300 Polyarteritis nodosa: Secondary | ICD-10-CM | POA: Diagnosis not present

## 2014-11-28 DIAGNOSIS — E538 Deficiency of other specified B group vitamins: Secondary | ICD-10-CM | POA: Diagnosis not present

## 2014-12-08 ENCOUNTER — Other Ambulatory Visit: Payer: Self-pay | Admitting: Family Medicine

## 2014-12-15 ENCOUNTER — Telehealth: Payer: Self-pay | Admitting: Family Medicine

## 2014-12-15 DIAGNOSIS — G894 Chronic pain syndrome: Secondary | ICD-10-CM

## 2014-12-15 MED ORDER — HYDROCODONE-ACETAMINOPHEN 7.5-325 MG PO TABS: 1.0000 | ORAL_TABLET | Freq: Four times a day (QID) | ORAL | Status: DC | PRN

## 2014-12-15 NOTE — Telephone Encounter (Signed)
Caller name:Troy, Huxley Relation to ZO:XWRU Call back [place]:  Reason for call: pt is needing rxHYDROcodone-acetaminophen ([place]) 7.5-325 MG per tablet, please call when available for pick up

## 2014-12-15 NOTE — Telephone Encounter (Signed)
Refill x1--- get UDS

## 2014-12-15 NOTE — Telephone Encounter (Signed)
Last OV 02/25/14 (CPE scheduled for October 2016) Hydrocodone last filled 11/18/14 #60 with 0  CSC, low risk , due for UDS

## 2014-12-15 NOTE — Telephone Encounter (Signed)
Med filled, pt notified available at front desk.

## 2014-12-16 ENCOUNTER — Other Ambulatory Visit: Payer: Self-pay | Admitting: Family Medicine

## 2014-12-16 NOTE — Telephone Encounter (Signed)
Medication filled to pharmacy as requested.   

## 2015-01-06 ENCOUNTER — Other Ambulatory Visit: Payer: Self-pay | Admitting: Family Medicine

## 2015-01-07 NOTE — Telephone Encounter (Signed)
Medication filled to pharmacy as requested.   

## 2015-01-07 NOTE — Telephone Encounter (Signed)
Last OV 02/25/14 Alprazolam last filled 11/11/14 #90 with 1

## 2015-01-12 ENCOUNTER — Other Ambulatory Visit: Payer: Self-pay | Admitting: Family Medicine

## 2015-01-12 ENCOUNTER — Telehealth: Payer: Self-pay | Admitting: Family Medicine

## 2015-01-12 DIAGNOSIS — G894 Chronic pain syndrome: Secondary | ICD-10-CM

## 2015-01-12 NOTE — Telephone Encounter (Signed)
Last OV 02/25/14 Hydrocodone last filled 12/15/14 #60 with 0

## 2015-01-12 NOTE — Telephone Encounter (Signed)
Medication filled to pharmacy as requested.   

## 2015-01-12 NOTE — Telephone Encounter (Signed)
Relation to GU:YQIH Call back number: [phone] Pharmacy:  Reason for call:  Patient requesting a refill HYDROcodone-acetaminophen (NORCO) 7.5-325 MG per tablet

## 2015-01-13 MED ORDER — HYDROCODONE-ACETAMINOPHEN 7.5-325 MG PO TABS: 1.0000 | ORAL_TABLET | Freq: Four times a day (QID) | ORAL | Status: DC | PRN

## 2015-01-13 NOTE — Telephone Encounter (Signed)
Ok for #60 

## 2015-01-13 NOTE — Telephone Encounter (Signed)
Med filed, pt notified available at front desk for pick up.

## 2015-02-10 ENCOUNTER — Other Ambulatory Visit: Payer: Self-pay | Admitting: Family Medicine

## 2015-02-10 NOTE — Telephone Encounter (Signed)
[place] OV 02/25/14 Alprazolam last filled 01/07/15 #90 with 1

## 2015-02-12 ENCOUNTER — Telehealth: Payer: Self-pay | Admitting: Family Medicine

## 2015-02-12 DIAGNOSIS — G894 Chronic pain syndrome: Secondary | ICD-10-CM

## 2015-02-12 MED ORDER — HYDROCODONE-ACETAMINOPHEN 7.5-325 MG PO TABS: 1.0000 | ORAL_TABLET | Freq: Four times a day (QID) | ORAL | Status: DC | PRN

## 2015-02-12 NOTE — Telephone Encounter (Signed)
Ok for #60 

## 2015-02-12 NOTE — Telephone Encounter (Signed)
Caller name: Dyesha Henault  Relation to FM:ZUAU Call back number: [phone]    Reason for call:  Requesting a refill HYDROcodone-acetaminophen (NORCO) 7.5-325 MG per tablet

## 2015-02-12 NOTE — Telephone Encounter (Signed)
Med filled and pt informed.  

## 2015-02-12 NOTE — Telephone Encounter (Signed)
Last OV 02/2014 Hydrocodone last filled 01/13/15 #60 with 0

## 2015-02-19 ENCOUNTER — Encounter: Payer: Self-pay | Admitting: Family Medicine

## 2015-02-19 ENCOUNTER — Other Ambulatory Visit: Payer: Self-pay | Admitting: Family Medicine

## 2015-02-19 NOTE — Telephone Encounter (Signed)
Medication filled to pharmacy as requested.   

## 2015-02-19 NOTE — Telephone Encounter (Signed)
Last OV 02/25/14 Clonazepam last filled 10/20/14 #30 with 3

## 2015-03-02 ENCOUNTER — Other Ambulatory Visit: Payer: Self-pay | Admitting: General Practice

## 2015-03-02 MED ORDER — PROMETHAZINE HCL 25 MG PO TABS: ORAL_TABLET | ORAL | Status: DC

## 2015-03-11 ENCOUNTER — Other Ambulatory Visit: Payer: Self-pay | Admitting: Family Medicine

## 2015-03-11 NOTE — Telephone Encounter (Signed)
Medication filled to pharmacy as requested.   

## 2015-03-11 NOTE — Telephone Encounter (Signed)
Last OV 02-25-14 Alprazolam last filled 01/07/15 #90 with 1

## 2015-03-13 ENCOUNTER — Other Ambulatory Visit: Payer: Self-pay | Admitting: Family Medicine

## 2015-03-13 NOTE — Telephone Encounter (Signed)
Medication filled to pharmacy as requested.   

## 2015-03-17 ENCOUNTER — Other Ambulatory Visit: Payer: Self-pay | Admitting: Family Medicine

## 2015-03-17 DIAGNOSIS — G894 Chronic pain syndrome: Secondary | ICD-10-CM

## 2015-03-17 NOTE — Telephone Encounter (Signed)
Caller name: Santana  Relationship to patient: Self  Can be reached: [phone]  Pharmacy:  Reason for call: Pt is requesting a refill on her HYDROcodone Rx.

## 2015-03-18 ENCOUNTER — Other Ambulatory Visit: Payer: Self-pay | Admitting: Family Medicine

## 2015-03-18 DIAGNOSIS — H524 Presbyopia: Secondary | ICD-10-CM | POA: Diagnosis not present

## 2015-03-18 DIAGNOSIS — H5203 Hypermetropia, bilateral: Secondary | ICD-10-CM | POA: Diagnosis not present

## 2015-03-18 DIAGNOSIS — M300 Polyarteritis nodosa: Secondary | ICD-10-CM | POA: Diagnosis not present

## 2015-03-18 DIAGNOSIS — Z79899 Other long term (current) drug therapy: Secondary | ICD-10-CM | POA: Diagnosis not present

## 2015-03-18 MED ORDER — HYDROCODONE-ACETAMINOPHEN 7.5-325 MG PO TABS: 1.0000 | ORAL_TABLET | Freq: Four times a day (QID) | ORAL | Status: DC | PRN

## 2015-03-18 NOTE — Telephone Encounter (Signed)
Last OV 02-25-14 Hydrocodone last filled 02/12/15 #60 with 0

## 2015-03-18 NOTE — Telephone Encounter (Signed)
Medication filled to pharmacy as requested.   

## 2015-03-18 NOTE — Telephone Encounter (Signed)
Pt informed that med available at front desk.

## 2015-03-24 ENCOUNTER — Other Ambulatory Visit: Payer: Self-pay | Admitting: General Practice

## 2015-03-24 ENCOUNTER — Telehealth: Payer: Self-pay | Admitting: Family Medicine

## 2015-03-24 MED ORDER — DULOXETINE HCL 60 MG PO CPEP: 120.0000 mg | ORAL_CAPSULE | Freq: Every day | ORAL | Status: DC

## 2015-03-24 MED ORDER — OMEPRAZOLE 40 MG PO CPDR: 40.0000 mg | DELAYED_RELEASE_CAPSULE | Freq: Every day | ORAL | Status: DC

## 2015-03-24 MED ORDER — OMEPRAZOLE 40 MG PO CPDR: 40.0000 mg | DELAYED_RELEASE_CAPSULE | Freq: Two times a day (BID) | ORAL | Status: DC

## 2015-03-24 NOTE — Telephone Encounter (Signed)
Caller name: Self   Can be reached: [phone]  Pharmacy: [address], [place], [place] [phone] (Phone) [phone] (Fax)         Reason for call: Dr in [facility] is requesting that patient takes Amprazole twice daily instead of once

## 2015-03-24 NOTE — Addendum Note (Signed)
Addended by: Davis Gourd on: 03/24/2015 01:36 PM   Modules accepted: Orders

## 2015-03-24 NOTE — Telephone Encounter (Signed)
Medication filled to pharmacy as requested.   

## 2015-03-24 NOTE — Telephone Encounter (Signed)
Ok to change to BID and increase prescription quantity

## 2015-03-24 NOTE — Telephone Encounter (Signed)
Please advise if ok to change Rx 

## 2015-03-25 NOTE — Telephone Encounter (Signed)
Patient states as per pharmacy never received medication change.

## 2015-03-26 MED ORDER — OMEPRAZOLE 40 MG PO CPDR: 40.0000 mg | DELAYED_RELEASE_CAPSULE | Freq: Two times a day (BID) | ORAL | Status: DC

## 2015-03-26 NOTE — Telephone Encounter (Signed)
Med filled again today at 8:31 am

## 2015-03-26 NOTE — Addendum Note (Signed)
Addended by: Davis Gourd on: 03/26/2015 08:31 AM   Modules accepted: Orders

## 2015-03-31 ENCOUNTER — Telehealth: Payer: Self-pay | Admitting: *Deleted

## 2015-03-31 NOTE — Telephone Encounter (Signed)
Recevied fax from the [employer] stating that pt currently does not have insurance and that they need a letter written by Dr. Birdie Riddle stating that the pt has no insurance on company letterhead. Fax forwarded to Dr. Birdie Riddle. JG//CMA

## 2015-04-01 ENCOUNTER — Encounter: Payer: Self-pay | Admitting: General Practice

## 2015-04-07 NOTE — Telephone Encounter (Signed)
Caller name: Ronalee Belts at [employer] Can be reached:  [phone] Fax: [phone]  Reason for call: Letter on company letterhead from Dr. Birdie Riddle stating that the pt has no income to qualify her for Cymbalta.

## 2015-04-07 NOTE — Telephone Encounter (Signed)
Correction, letter needs to include the pts home address, no insurance, no income.

## 2015-04-08 ENCOUNTER — Encounter: Payer: Self-pay | Admitting: General Practice

## 2015-04-08 NOTE — Telephone Encounter (Signed)
Letter reprinted and faxed today.

## 2015-04-15 ENCOUNTER — Other Ambulatory Visit: Payer: Self-pay | Admitting: Family Medicine

## 2015-04-15 NOTE — Telephone Encounter (Signed)
Last OV 03/15/14 Alprazolam last filled 03/11/15 #90 with 0

## 2015-04-15 NOTE — Telephone Encounter (Signed)
Medication filled to pharmacy as requested.   

## 2015-04-23 ENCOUNTER — Other Ambulatory Visit: Payer: Self-pay | Admitting: Family Medicine

## 2015-04-23 NOTE — Telephone Encounter (Signed)
Medication filled to pharmacy as requested.   

## 2015-04-28 ENCOUNTER — Telehealth: Payer: Self-pay | Admitting: Family Medicine

## 2015-04-28 DIAGNOSIS — F39 Unspecified mood [affective] disorder: Secondary | ICD-10-CM | POA: Diagnosis not present

## 2015-04-28 DIAGNOSIS — G894 Chronic pain syndrome: Secondary | ICD-10-CM

## 2015-04-28 DIAGNOSIS — F518 Other sleep disorders not due to a substance or known physiological condition: Secondary | ICD-10-CM | POA: Diagnosis not present

## 2015-04-28 DIAGNOSIS — F4321 Adjustment disorder with depressed mood: Secondary | ICD-10-CM | POA: Diagnosis not present

## 2015-04-28 MED ORDER — HYDROCODONE-ACETAMINOPHEN 7.5-325 MG PO TABS: 1.0000 | ORAL_TABLET | Freq: Four times a day (QID) | ORAL | Status: DC | PRN

## 2015-04-28 NOTE — Telephone Encounter (Signed)
Last OV 02-25-14 Hydrocodone last filled 03-18-15 #60 with 0

## 2015-04-28 NOTE — Telephone Encounter (Signed)
Med filled, waiting on provider to sign.

## 2015-04-28 NOTE — Telephone Encounter (Signed)
Ok for #60 

## 2015-04-28 NOTE — Telephone Encounter (Signed)
Caller name: Self   Can be reached: [phone]  Pharmacy:  [address] [place], [place] [phone] (Phone) [phone] (Fax)         Reason for call: Request refill  HYDROcodone-acetaminophen (NORCO) 7.5-325 MG tablet [id number]

## 2015-05-13 ENCOUNTER — Other Ambulatory Visit: Payer: Self-pay | Admitting: Family Medicine

## 2015-05-13 NOTE — Telephone Encounter (Signed)
Last OV 02-25-14 Alprazolam last filled 04-15-15 90 with 1

## 2015-05-20 ENCOUNTER — Encounter: Payer: Self-pay | Admitting: Family Medicine

## 2015-05-22 ENCOUNTER — Encounter: Payer: Self-pay | Admitting: Family Medicine

## 2015-06-02 ENCOUNTER — Other Ambulatory Visit: Payer: Self-pay | Admitting: Family Medicine

## 2015-06-03 ENCOUNTER — Other Ambulatory Visit: Payer: Self-pay

## 2015-06-03 ENCOUNTER — Ambulatory Visit (INDEPENDENT_AMBULATORY_CARE_PROVIDER_SITE_OTHER): Payer: Medicare Other | Admitting: Family Medicine

## 2015-06-03 ENCOUNTER — Encounter: Payer: Self-pay | Admitting: Family Medicine

## 2015-06-03 VITALS — BP 118/80 | HR 97 | Temp 98.0°F | Ht 63.0 in | Wt 360.2 lb

## 2015-06-03 DIAGNOSIS — G894 Chronic pain syndrome: Secondary | ICD-10-CM

## 2015-06-03 DIAGNOSIS — I10 Essential (primary) hypertension: Secondary | ICD-10-CM | POA: Diagnosis not present

## 2015-06-03 DIAGNOSIS — E785 Hyperlipidemia, unspecified: Secondary | ICD-10-CM

## 2015-06-03 LAB — CBC WITH DIFFERENTIAL/PLATELET
Basophils Absolute: 0.1 10*3/uL (ref 0.0–0.1)
Basophils Relative: 0.8 % (ref 0.0–3.0)
EOS PCT: 0.7 % (ref 0.0–5.0)
Eosinophils Absolute: 0.1 10*3/uL (ref 0.0–0.7)
HCT: 34.8 % — ABNORMAL LOW (ref 36.0–46.0)
HEMOGLOBIN: 11.2 g/dL — AB (ref 12.0–15.0)
Lymphocytes Relative: 11.3 % — ABNORMAL LOW (ref 12.0–46.0)
Lymphs Abs: 1.5 10*3/uL (ref 0.7–4.0)
MCHC: 32.3 g/dL (ref 30.0–36.0)
MCV: 87.1 fl (ref 78.0–100.0)
MONO ABS: 0.4 10*3/uL (ref 0.1–1.0)
MONOS PCT: 3.1 % (ref 3.0–12.0)
Neutro Abs: 11.4 10*3/uL — ABNORMAL HIGH (ref 1.4–7.7)
Neutrophils Relative %: 84.1 % — ABNORMAL HIGH (ref 43.0–77.0)
Platelets: 325 10*3/uL (ref 150.0–400.0)
RBC: 4 Mil/uL (ref 3.87–5.11)
RDW: 17.6 % — ABNORMAL HIGH (ref 11.5–15.5)
WBC: 13.5 10*3/uL — AB (ref 4.0–10.5)

## 2015-06-03 LAB — HEPATIC FUNCTION PANEL
ALBUMIN: 3.9 g/dL (ref 3.5–5.2)
ALT: 22 U/L (ref 0–35)
AST: 18 U/L (ref 0–37)
Alkaline Phosphatase: 90 U/L (ref 39–117)
Bilirubin, Direct: 0 mg/dL (ref 0.0–0.3)
Total Bilirubin: 0.3 mg/dL (ref 0.2–1.2)
Total Protein: 6.6 g/dL (ref 6.0–8.3)

## 2015-06-03 LAB — LIPID PANEL
CHOLESTEROL: 208 mg/dL — AB (ref 0–200)
HDL: 42.1 mg/dL (ref >39.00)
NonHDL: 165.54
TRIGLYCERIDES: 297 mg/dL — AB (ref 0.0–149.0)
Total CHOL/HDL Ratio: 5
VLDL: 59.4 mg/dL — AB (ref 0.0–40.0)

## 2015-06-03 LAB — BASIC METABOLIC PANEL
BUN: 9 mg/dL (ref 6–23)
CHLORIDE: 99 meq/L (ref 96–112)
CO2: 29 mEq/L (ref 19–32)
Calcium: 9.2 mg/dL (ref 8.4–10.5)
Creatinine, Ser: 0.93 mg/dL (ref 0.40–1.20)
GFR: 69.14 mL/min (ref >60.00)
GLUCOSE: 101 mg/dL — AB (ref 70–99)
POTASSIUM: 3.7 meq/L (ref 3.5–5.1)
Sodium: 139 mEq/L (ref 135–145)

## 2015-06-03 LAB — TSH: TSH: 1.15 u[IU]/mL (ref 0.35–4.50)

## 2015-06-03 LAB — LDL CHOLESTEROL, DIRECT: Direct LDL: 115 mg/dL

## 2015-06-03 MED ORDER — HYDROCODONE-ACETAMINOPHEN 7.5-325 MG PO TABS: 1.0000 | ORAL_TABLET | Freq: Four times a day (QID) | ORAL | Status: DC | PRN

## 2015-06-03 NOTE — Progress Notes (Signed)
Pre visit review using our clinic review tool, if applicable. No additional management support is needed unless otherwise documented below in the visit note. 

## 2015-06-03 NOTE — Assessment & Plan Note (Signed)
Chronic problem.  Pt has been out of statin for some time due to lack of insurance.  Check labs and restart prn.  Pt expressed understanding and is in agreement w/ plan.

## 2015-06-03 NOTE — Assessment & Plan Note (Signed)
Chronic problem.  Adequate control.  Asymptomatic.  Check labs.  No anticipated med changes 

## 2015-06-03 NOTE — Assessment & Plan Note (Signed)
Pt has gained 60 lbs despite gastric bypass due to chronic prednisone and inability to exercise due to pain.  Check labs to risk stratify.  Stressed need for healthy diet.  Will follow.

## 2015-06-03 NOTE — Patient Instructions (Signed)
Schedule your complete physical in 6 months We'll notify you of your lab results and make any changes if needed Continue to work on healthy diet and regular exercise- you can do it! Call with any questions or concerns If you want to join [place] at the new [place] office, any scheduled appointments will automatically transfer and we will see you at [address] [place] [address] [place], [place], [place] [address] ([place]) [place] in there!!!

## 2015-06-03 NOTE — Progress Notes (Signed)
   Subjective:    Patient ID: [name], female    DOB: 12-03-1970, 45 y.o.   MRN: [id number]  HPI Hyperlipidemia- chronic problem, was on Crestor daily but has not taken in a few months due to lack of insurance.  Denies abd pain, N/V.  + chronic myalgias.  HTN- chronic problem, on Bystolic, Lasix.  BP is well controlled today.  Denies CP, SOB, HAs, visual changes, edema.  Morbid obesity- chronic problem, pt has gained 60 lbs despite gastric bypass due to recurrent use of prednisone and inability to exercise.   Review of Systems For ROS see HPI     Objective:   Physical Exam  Constitutional: She is oriented to person, place, and time. She appears well-developed and well-nourished. No distress.  Morbid obesity  HENT:  Head: Normocephalic and atraumatic.  Eyes: Conjunctivae and EOM are normal. Pupils are equal, round, and reactive to light.  Neck: Normal range of motion. Neck supple. No thyromegaly present.  Cardiovascular: Normal rate, regular rhythm, normal heart sounds and intact distal pulses.   No murmur heard. Pulmonary/Chest: Effort normal and breath sounds normal. No respiratory distress.  Abdominal: Soft. She exhibits no distension. There is no tenderness.  Musculoskeletal: She exhibits no edema.  Lymphadenopathy:    She has no cervical adenopathy.  Neurological: She is alert and oriented to person, place, and time.  Skin: Skin is warm and dry.  Psychiatric: She has a normal mood and affect. Her behavior is normal.  Vitals reviewed.         Assessment & Plan:

## 2015-06-04 ENCOUNTER — Other Ambulatory Visit: Payer: Self-pay

## 2015-06-04 MED ORDER — ROSUVASTATIN CALCIUM 20 MG PO TABS: 20.0000 mg | ORAL_TABLET | Freq: Every day | ORAL | Status: DC

## 2015-06-08 ENCOUNTER — Other Ambulatory Visit: Payer: Self-pay | Admitting: Family Medicine

## 2015-06-08 NOTE — Telephone Encounter (Signed)
Medication filled to pharmacy as requested.   

## 2015-06-08 NOTE — Telephone Encounter (Signed)
Last OV 06/03/15 Alprazolam last filled 04/15/15 #90 with 1

## 2015-06-10 ENCOUNTER — Other Ambulatory Visit: Payer: Self-pay

## 2015-06-12 ENCOUNTER — Ambulatory Visit (INDEPENDENT_AMBULATORY_CARE_PROVIDER_SITE_OTHER): Payer: Medicare Other | Admitting: Family Medicine

## 2015-06-12 ENCOUNTER — Encounter: Payer: Self-pay | Admitting: Family Medicine

## 2015-06-12 VITALS — BP 118/80 | HR 86 | Temp 98.0°F | Ht 63.0 in | Wt 356.6 lb

## 2015-06-12 DIAGNOSIS — R10A0 Flank pain, unspecified side: Secondary | ICD-10-CM

## 2015-06-12 DIAGNOSIS — R109 Unspecified abdominal pain: Secondary | ICD-10-CM

## 2015-06-12 LAB — POCT URINALYSIS DIPSTICK
Bilirubin, UA: NEGATIVE
Blood, UA: NEGATIVE
Glucose, UA: NEGATIVE
KETONES UA: NEGATIVE
Leukocytes, UA: NEGATIVE
Nitrite, UA: NEGATIVE
PH UA: 6
PROTEIN UA: NEGATIVE
SPEC GRAV UA: 1.02
Urobilinogen, UA: 4

## 2015-06-12 MED ORDER — TIZANIDINE HCL 4 MG PO TABS: 4.0000 mg | ORAL_TABLET | Freq: Four times a day (QID) | ORAL | Status: DC | PRN

## 2015-06-12 NOTE — Progress Notes (Signed)
Pre visit review using our clinic review tool, if applicable. No additional management support is needed unless otherwise documented below in the visit note. 

## 2015-06-12 NOTE — Progress Notes (Signed)
   Subjective:    Patient ID: [name], female    DOB: [birth year], 45 y.o.   MRN: [id number]  HPI R sided abd pain- sxs started 'a couple of weeks ago'.  Intermittent.  'feels like a pulled muscle'.  + nausea.  No vomiting.  No SOB.  + TTP- 'it's like you're touching a sore spot'.  No rash.  sxs will stop and start suddenly.  No known injury, no change in activity, no heavy lifting.  S/p cholecystectomy.  No dysuria, hematuria.   Review of Systems For ROS see HPI     Objective:   Physical Exam  Constitutional: She is oriented to person, place, and time. She appears well-developed and well-nourished. No distress.  obese  Abdominal: Soft. Bowel sounds are normal. She exhibits no distension. There is no tenderness. There is no rebound.  Musculoskeletal: She exhibits tenderness (TTP over R flank extending along rib).  Neurological: She is alert and oriented to person, place, and time.  Skin: Skin is warm and dry. No rash noted. No erythema.  Psychiatric: She has a normal mood and affect. Her behavior is normal. Thought content normal.  Vitals reviewed.         Assessment & Plan:

## 2015-06-12 NOTE — Patient Instructions (Signed)
Follow up as needed Start the Tizanidine for muscle pain/spasm Take the hydrocodone as needed If no improvement or worsening- please let me know so we can do some imaging Call with any questions or concerns Hang in there!!!

## 2015-06-14 DIAGNOSIS — R109 Unspecified abdominal pain: Secondary | ICD-10-CM | POA: Insufficient documentation

## 2015-06-14 NOTE — Assessment & Plan Note (Signed)
New.  Pt's pain is not abdominal, but rather flank pain.  No rash or evidence of shingles.  No hematuria to suggest kidney stone.  Intermittent nature is more consistent w/ musculoskeletal etiology.  Pt already has pain meds available.  Start muscle relaxer prn.  Reviewed supportive care and red flags that should prompt return.  Pt expressed understanding and is in agreement w/ plan.

## 2015-06-18 ENCOUNTER — Other Ambulatory Visit: Payer: Self-pay | Admitting: Family Medicine

## 2015-06-18 NOTE — Telephone Encounter (Signed)
Last OV 06/12/15 Clonazepam last filled 02/19/15 #30 with 3

## 2015-06-19 NOTE — Telephone Encounter (Signed)
Medication filled to pharmacy as requested.   

## 2015-06-22 ENCOUNTER — Other Ambulatory Visit: Payer: Self-pay | Admitting: General Practice

## 2015-06-22 MED ORDER — DULOXETINE HCL 60 MG PO CPEP: 120.0000 mg | ORAL_CAPSULE | Freq: Every day | ORAL | Status: DC

## 2015-06-25 ENCOUNTER — Other Ambulatory Visit: Payer: Self-pay | Admitting: Family Medicine

## 2015-06-25 DIAGNOSIS — G894 Chronic pain syndrome: Secondary | ICD-10-CM

## 2015-06-25 MED ORDER — HYDROCODONE-ACETAMINOPHEN 7.5-325 MG PO TABS: 1.0000 | ORAL_TABLET | Freq: Four times a day (QID) | ORAL | Status: DC | PRN

## 2015-06-25 NOTE — Telephone Encounter (Signed)
Caller name: Self  Can be reached: [phone]  Reason for call: Request refill on HYDROcodone-acetaminophen (NORCO) 7.5-325 MG tablet [id number]   Last filled 06/03/2015 #60

## 2015-06-25 NOTE — Telephone Encounter (Signed)
Med printed and given to PCP for signature.

## 2015-06-25 NOTE — Telephone Encounter (Signed)
Ok for refill? 

## 2015-06-26 ENCOUNTER — Telehealth: Payer: Self-pay | Admitting: *Deleted

## 2015-06-26 NOTE — Telephone Encounter (Signed)
Called patient to inform that she could come p/u paperwork for [employer] [facility] on Cymbalta, as note requested, so patient could attach her income information; patient request that we shred paperwork; as she now has [facility] and it is cheaper with her Insurance; papers shredded/SLS 02/10

## 2015-07-01 DIAGNOSIS — I10 Essential (primary) hypertension: Secondary | ICD-10-CM | POA: Diagnosis not present

## 2015-07-01 DIAGNOSIS — M300 Polyarteritis nodosa: Secondary | ICD-10-CM | POA: Diagnosis not present

## 2015-07-01 DIAGNOSIS — M25551 Pain in right hip: Secondary | ICD-10-CM | POA: Diagnosis not present

## 2015-07-01 DIAGNOSIS — M79605 Pain in left leg: Secondary | ICD-10-CM | POA: Diagnosis not present

## 2015-07-01 DIAGNOSIS — F329 Major depressive disorder, single episode, unspecified: Secondary | ICD-10-CM | POA: Diagnosis not present

## 2015-07-01 DIAGNOSIS — M79604 Pain in right leg: Secondary | ICD-10-CM | POA: Diagnosis not present

## 2015-07-01 DIAGNOSIS — K219 Gastro-esophageal reflux disease without esophagitis: Secondary | ICD-10-CM | POA: Diagnosis not present

## 2015-07-01 DIAGNOSIS — Z79899 Other long term (current) drug therapy: Secondary | ICD-10-CM | POA: Diagnosis not present

## 2015-07-06 ENCOUNTER — Other Ambulatory Visit: Payer: Self-pay | Admitting: Family Medicine

## 2015-07-06 NOTE — Telephone Encounter (Signed)
Medication filled to pharmacy as requested.   

## 2015-07-14 ENCOUNTER — Other Ambulatory Visit: Payer: Self-pay | Admitting: Family Medicine

## 2015-07-14 ENCOUNTER — Telehealth: Payer: Self-pay | Admitting: Family Medicine

## 2015-07-14 NOTE — Telephone Encounter (Signed)
Medication filled to pharmacy as requested.   

## 2015-07-14 NOTE — Telephone Encounter (Signed)
Relation to [id number] Call back [place], [place], [place] [phone] (Phone) [phone] (Fax)         Reason for call:  Patient requesting a refill cyclobenzaprine (FLEXERIL) 10 MG tablet

## 2015-07-16 ENCOUNTER — Other Ambulatory Visit: Payer: Self-pay | Admitting: Family Medicine

## 2015-07-16 NOTE — Telephone Encounter (Signed)
Last OV 06/12/15 (flank pain) Alprazolam last filled 06/08/15 #90 with 1

## 2015-07-20 ENCOUNTER — Other Ambulatory Visit: Payer: Self-pay | Admitting: Family Medicine

## 2015-07-20 NOTE — Telephone Encounter (Signed)
Medication filled to pharmacy as requested.   

## 2015-07-22 ENCOUNTER — Telehealth: Payer: Self-pay | Admitting: Family Medicine

## 2015-07-22 NOTE — Telephone Encounter (Signed)
Chart updated to reflect 

## 2015-07-22 NOTE — Telephone Encounter (Signed)
Patient received flu shot January 2017 at [facility]

## 2015-07-28 ENCOUNTER — Other Ambulatory Visit: Payer: Self-pay | Admitting: Family Medicine

## 2015-07-28 DIAGNOSIS — G894 Chronic pain syndrome: Secondary | ICD-10-CM

## 2015-07-28 NOTE — Telephone Encounter (Signed)
Pt request a refill on her HYDROcodone Rx.    CB : [phone]

## 2015-07-29 DIAGNOSIS — Z9884 Bariatric surgery status: Secondary | ICD-10-CM | POA: Diagnosis not present

## 2015-07-29 DIAGNOSIS — Z7952 Long term (current) use of systemic steroids: Secondary | ICD-10-CM | POA: Diagnosis not present

## 2015-07-29 DIAGNOSIS — K121 Other forms of stomatitis: Secondary | ICD-10-CM | POA: Diagnosis not present

## 2015-07-29 DIAGNOSIS — Z5181 Encounter for therapeutic drug level monitoring: Secondary | ICD-10-CM | POA: Diagnosis not present

## 2015-07-29 DIAGNOSIS — Z886 Allergy status to analgesic agent status: Secondary | ICD-10-CM | POA: Diagnosis not present

## 2015-07-29 DIAGNOSIS — K1379 Other lesions of oral mucosa: Secondary | ICD-10-CM | POA: Diagnosis not present

## 2015-07-29 DIAGNOSIS — I10 Essential (primary) hypertension: Secondary | ICD-10-CM | POA: Diagnosis not present

## 2015-07-29 DIAGNOSIS — Z8541 Personal history of malignant neoplasm of cervix uteri: Secondary | ICD-10-CM | POA: Diagnosis not present

## 2015-07-29 DIAGNOSIS — R0600 Dyspnea, unspecified: Secondary | ICD-10-CM | POA: Diagnosis not present

## 2015-07-29 DIAGNOSIS — Z79899 Other long term (current) drug therapy: Secondary | ICD-10-CM | POA: Diagnosis not present

## 2015-07-29 DIAGNOSIS — F329 Major depressive disorder, single episode, unspecified: Secondary | ICD-10-CM | POA: Diagnosis not present

## 2015-07-29 DIAGNOSIS — M300 Polyarteritis nodosa: Secondary | ICD-10-CM | POA: Diagnosis not present

## 2015-07-29 DIAGNOSIS — Z888 Allergy status to other drugs, medicaments and biological substances status: Secondary | ICD-10-CM | POA: Diagnosis not present

## 2015-07-29 DIAGNOSIS — M797 Fibromyalgia: Secondary | ICD-10-CM | POA: Diagnosis not present

## 2015-07-29 DIAGNOSIS — E785 Hyperlipidemia, unspecified: Secondary | ICD-10-CM | POA: Diagnosis not present

## 2015-07-29 DIAGNOSIS — Z882 Allergy status to sulfonamides status: Secondary | ICD-10-CM | POA: Diagnosis not present

## 2015-07-29 DIAGNOSIS — M25562 Pain in left knee: Secondary | ICD-10-CM | POA: Diagnosis not present

## 2015-07-29 MED ORDER — HYDROCODONE-ACETAMINOPHEN 7.5-325 MG PO TABS: 1.0000 | ORAL_TABLET | Freq: Four times a day (QID) | ORAL | Status: DC | PRN

## 2015-07-29 NOTE — Telephone Encounter (Signed)
Last OV 06/12/15 Hydrocodone last filled 06/25/15 #60 with 0

## 2015-07-29 NOTE — Telephone Encounter (Signed)
Med filled, pt made aware.

## 2015-07-31 ENCOUNTER — Telehealth: Payer: Self-pay | Admitting: Family Medicine

## 2015-07-31 NOTE — Telephone Encounter (Signed)
Pharmacy: [facility], [place], [address]  Reason for call: pt requesting refill on b12 injections that she was taking before. She's been out for a while because she's had other issues going on.

## 2015-07-31 NOTE — Telephone Encounter (Signed)
Pt advised that she had bloodwork completed at [place], per that provider she was told to contact our office to restart the B12 injections. I advised pt that we cannot send this medication in until we can look at these labs first.

## 2015-07-31 NOTE — Telephone Encounter (Signed)
Last B12 was in 2015 and her B12 was 1100 (H). Please advise?

## 2015-07-31 NOTE — Telephone Encounter (Signed)
Will need B12 level prior to restarting injections

## 2015-07-31 NOTE — Telephone Encounter (Signed)
Waiting on pt to bring in labs.

## 2015-07-31 NOTE — Telephone Encounter (Signed)
The most recent labs from [facility] from [facility] show B12>1100 and do not indicate need for injxns.  Unless there are more recent labs to review, B12 shots are not needed

## 2015-08-06 ENCOUNTER — Other Ambulatory Visit: Payer: Self-pay | Admitting: Family Medicine

## 2015-08-06 NOTE — Telephone Encounter (Signed)
Medication filled to pharmacy as requested.   

## 2015-08-11 ENCOUNTER — Other Ambulatory Visit: Payer: Self-pay | Admitting: Family Medicine

## 2015-08-11 NOTE — Telephone Encounter (Signed)
Medication filled to pharmacy as requested.   

## 2015-08-17 ENCOUNTER — Telehealth: Payer: Self-pay | Admitting: Family Medicine

## 2015-08-17 MED ORDER — NEBIVOLOL HCL 5 MG PO TABS: 5.0000 mg | ORAL_TABLET | Freq: Every day | ORAL | Status: DC

## 2015-08-17 NOTE — Telephone Encounter (Signed)
Pt needs refill on bystolic and would like it sent to [facility].

## 2015-08-17 NOTE — Telephone Encounter (Signed)
Medication filled to pharmacy as requested.   

## 2015-08-24 DIAGNOSIS — Z79899 Other long term (current) drug therapy: Secondary | ICD-10-CM | POA: Diagnosis not present

## 2015-08-24 DIAGNOSIS — Z5181 Encounter for therapeutic drug level monitoring: Secondary | ICD-10-CM | POA: Diagnosis not present

## 2015-08-24 DIAGNOSIS — M300 Polyarteritis nodosa: Secondary | ICD-10-CM | POA: Diagnosis not present

## 2015-08-24 DIAGNOSIS — E538 Deficiency of other specified B group vitamins: Secondary | ICD-10-CM | POA: Diagnosis not present

## 2015-08-31 ENCOUNTER — Other Ambulatory Visit: Payer: Self-pay | Admitting: Family Medicine

## 2015-08-31 NOTE — Telephone Encounter (Signed)
Last OV 06/12/15 Alprazolam last filled 06/08/15 #90 with 1

## 2015-09-07 ENCOUNTER — Other Ambulatory Visit: Payer: Self-pay | Admitting: Family Medicine

## 2015-09-07 NOTE — Telephone Encounter (Signed)
Medication filled to pharmacy as requested.   

## 2015-09-09 ENCOUNTER — Other Ambulatory Visit: Payer: Self-pay | Admitting: Family Medicine

## 2015-09-09 DIAGNOSIS — G894 Chronic pain syndrome: Secondary | ICD-10-CM

## 2015-09-09 MED ORDER — HYDROCODONE-ACETAMINOPHEN 7.5-325 MG PO TABS: 1.0000 | ORAL_TABLET | Freq: Four times a day (QID) | ORAL | Status: DC | PRN

## 2015-09-09 NOTE — Telephone Encounter (Signed)
Med filled available at front desk for pick up on Friday.

## 2015-09-09 NOTE — Telephone Encounter (Signed)
Pt needs refill on hydrocodone and will p/u on Friday.

## 2015-09-09 NOTE — Telephone Encounter (Signed)
Last OV 06/03/15 Hydrocodone last filled 07/29/15 #60 with 0

## 2015-09-12 ENCOUNTER — Other Ambulatory Visit: Payer: Self-pay | Admitting: Family Medicine

## 2015-09-16 DIAGNOSIS — M300 Polyarteritis nodosa: Secondary | ICD-10-CM | POA: Diagnosis not present

## 2015-09-16 DIAGNOSIS — Z882 Allergy status to sulfonamides status: Secondary | ICD-10-CM | POA: Diagnosis not present

## 2015-09-16 DIAGNOSIS — Z887 Allergy status to serum and vaccine status: Secondary | ICD-10-CM | POA: Diagnosis not present

## 2015-09-16 DIAGNOSIS — Z7982 Long term (current) use of aspirin: Secondary | ICD-10-CM | POA: Diagnosis not present

## 2015-09-16 DIAGNOSIS — F419 Anxiety disorder, unspecified: Secondary | ICD-10-CM | POA: Diagnosis not present

## 2015-09-16 DIAGNOSIS — Z90710 Acquired absence of both cervix and uterus: Secondary | ICD-10-CM | POA: Diagnosis not present

## 2015-09-16 DIAGNOSIS — M797 Fibromyalgia: Secondary | ICD-10-CM | POA: Diagnosis not present

## 2015-09-16 DIAGNOSIS — Z79899 Other long term (current) drug therapy: Secondary | ICD-10-CM | POA: Diagnosis not present

## 2015-09-16 DIAGNOSIS — Z5181 Encounter for therapeutic drug level monitoring: Secondary | ICD-10-CM | POA: Diagnosis not present

## 2015-09-16 DIAGNOSIS — Z7952 Long term (current) use of systemic steroids: Secondary | ICD-10-CM | POA: Diagnosis not present

## 2015-09-16 DIAGNOSIS — Z9884 Bariatric surgery status: Secondary | ICD-10-CM | POA: Diagnosis not present

## 2015-09-16 DIAGNOSIS — Z881 Allergy status to other antibiotic agents status: Secondary | ICD-10-CM | POA: Diagnosis not present

## 2015-09-16 DIAGNOSIS — Z9049 Acquired absence of other specified parts of digestive tract: Secondary | ICD-10-CM | POA: Diagnosis not present

## 2015-09-16 DIAGNOSIS — K219 Gastro-esophageal reflux disease without esophagitis: Secondary | ICD-10-CM | POA: Diagnosis not present

## 2015-09-16 DIAGNOSIS — Z886 Allergy status to analgesic agent status: Secondary | ICD-10-CM | POA: Diagnosis not present

## 2015-09-16 DIAGNOSIS — I10 Essential (primary) hypertension: Secondary | ICD-10-CM | POA: Diagnosis not present

## 2015-09-16 DIAGNOSIS — Z888 Allergy status to other drugs, medicaments and biological substances status: Secondary | ICD-10-CM | POA: Diagnosis not present

## 2015-09-16 DIAGNOSIS — Z79891 Long term (current) use of opiate analgesic: Secondary | ICD-10-CM | POA: Diagnosis not present

## 2015-09-16 DIAGNOSIS — E785 Hyperlipidemia, unspecified: Secondary | ICD-10-CM | POA: Diagnosis not present

## 2015-09-16 DIAGNOSIS — F329 Major depressive disorder, single episode, unspecified: Secondary | ICD-10-CM | POA: Diagnosis not present

## 2015-09-22 DIAGNOSIS — F4321 Adjustment disorder with depressed mood: Secondary | ICD-10-CM | POA: Diagnosis not present

## 2015-09-22 DIAGNOSIS — F39 Unspecified mood [affective] disorder: Secondary | ICD-10-CM | POA: Diagnosis not present

## 2015-09-22 DIAGNOSIS — F518 Other sleep disorders not due to a substance or known physiological condition: Secondary | ICD-10-CM | POA: Diagnosis not present

## 2015-10-05 ENCOUNTER — Encounter: Payer: Self-pay | Admitting: Family Medicine

## 2015-10-05 ENCOUNTER — Other Ambulatory Visit: Payer: Self-pay | Admitting: General Practice

## 2015-10-05 ENCOUNTER — Other Ambulatory Visit: Payer: Self-pay | Admitting: Family Medicine

## 2015-10-05 MED ORDER — PROMETHAZINE HCL 25 MG PO TABS: ORAL_TABLET | ORAL | Status: DC

## 2015-10-05 MED ORDER — ALPRAZOLAM 0.5 MG PO TABS: ORAL_TABLET | ORAL | Status: DC

## 2015-10-05 NOTE — Telephone Encounter (Signed)
Medication filled to pharmacy as requested.   

## 2015-10-05 NOTE — Telephone Encounter (Signed)
Last OV 06/12/15 (flank pain ) These medications were both filled today and per pharmacy all refills will be placed on hold

## 2015-10-05 NOTE — Telephone Encounter (Signed)
Filled the alprazolam as it was not originally sent to pharmacy with Pt's name on the script.

## 2015-10-06 ENCOUNTER — Other Ambulatory Visit: Payer: Self-pay | Admitting: Family Medicine

## 2015-10-06 DIAGNOSIS — G894 Chronic pain syndrome: Secondary | ICD-10-CM

## 2015-10-06 NOTE — Telephone Encounter (Signed)
Pt requesting refill of HYDROcodone-acetaminophen (NORCO) 7.5-325 MG tablet.  Please call when ready for pick up.

## 2015-10-06 NOTE — Telephone Encounter (Signed)
Last OV 06/12/15 Hydrocodone last filled 09/09/15 #60 with 0

## 2015-10-07 DIAGNOSIS — M797 Fibromyalgia: Secondary | ICD-10-CM | POA: Diagnosis not present

## 2015-10-07 DIAGNOSIS — Z886 Allergy status to analgesic agent status: Secondary | ICD-10-CM | POA: Diagnosis not present

## 2015-10-07 DIAGNOSIS — Z7982 Long term (current) use of aspirin: Secondary | ICD-10-CM | POA: Diagnosis not present

## 2015-10-07 DIAGNOSIS — I10 Essential (primary) hypertension: Secondary | ICD-10-CM | POA: Diagnosis not present

## 2015-10-07 DIAGNOSIS — M300 Polyarteritis nodosa: Secondary | ICD-10-CM | POA: Diagnosis not present

## 2015-10-07 DIAGNOSIS — F419 Anxiety disorder, unspecified: Secondary | ICD-10-CM | POA: Diagnosis not present

## 2015-10-07 DIAGNOSIS — Z79899 Other long term (current) drug therapy: Secondary | ICD-10-CM | POA: Diagnosis not present

## 2015-10-07 DIAGNOSIS — F329 Major depressive disorder, single episode, unspecified: Secondary | ICD-10-CM | POA: Diagnosis not present

## 2015-10-07 DIAGNOSIS — E785 Hyperlipidemia, unspecified: Secondary | ICD-10-CM | POA: Diagnosis not present

## 2015-10-07 DIAGNOSIS — Z9884 Bariatric surgery status: Secondary | ICD-10-CM | POA: Diagnosis not present

## 2015-10-07 DIAGNOSIS — K219 Gastro-esophageal reflux disease without esophagitis: Secondary | ICD-10-CM | POA: Diagnosis not present

## 2015-10-07 MED ORDER — HYDROCODONE-ACETAMINOPHEN 7.5-325 MG PO TABS: 1.0000 | ORAL_TABLET | Freq: Four times a day (QID) | ORAL | Status: DC | PRN

## 2015-10-13 ENCOUNTER — Other Ambulatory Visit: Payer: Self-pay | Admitting: Family Medicine

## 2015-10-13 NOTE — Telephone Encounter (Signed)
Medication filled to pharmacy as requested.   

## 2015-10-13 NOTE — Telephone Encounter (Signed)
Last OV 06/12/15 Clonazepam last filled 06/19/15 #30 with 3

## 2015-10-19 DIAGNOSIS — I8311 Varicose veins of right lower extremity with inflammation: Secondary | ICD-10-CM | POA: Diagnosis not present

## 2015-10-19 DIAGNOSIS — Z79899 Other long term (current) drug therapy: Secondary | ICD-10-CM | POA: Diagnosis not present

## 2015-10-19 DIAGNOSIS — R21 Rash and other nonspecific skin eruption: Secondary | ICD-10-CM | POA: Diagnosis not present

## 2015-10-19 DIAGNOSIS — M300 Polyarteritis nodosa: Secondary | ICD-10-CM | POA: Diagnosis not present

## 2015-10-19 DIAGNOSIS — I8312 Varicose veins of left lower extremity with inflammation: Secondary | ICD-10-CM | POA: Diagnosis not present

## 2015-10-26 ENCOUNTER — Ambulatory Visit: Payer: Medicare Other | Admitting: Family Medicine

## 2015-10-28 ENCOUNTER — Other Ambulatory Visit: Payer: Self-pay | Admitting: Family Medicine

## 2015-10-29 NOTE — Telephone Encounter (Signed)
Medication filled to pharmacy as requested.   

## 2015-11-02 ENCOUNTER — Other Ambulatory Visit: Payer: Self-pay | Admitting: Family Medicine

## 2015-11-02 NOTE — Telephone Encounter (Signed)
Last OV 06/12/15 Alprazolam last filled 10/05/15 #90 with 1

## 2015-11-03 DIAGNOSIS — Z79899 Other long term (current) drug therapy: Secondary | ICD-10-CM | POA: Diagnosis not present

## 2015-11-03 DIAGNOSIS — M300 Polyarteritis nodosa: Secondary | ICD-10-CM | POA: Diagnosis not present

## 2015-11-03 DIAGNOSIS — I872 Venous insufficiency (chronic) (peripheral): Secondary | ICD-10-CM | POA: Diagnosis not present

## 2015-11-04 ENCOUNTER — Other Ambulatory Visit: Payer: Self-pay | Admitting: Family Medicine

## 2015-11-04 NOTE — Telephone Encounter (Signed)
Last OV 06/12/15 (flank Pain) Alprazolam last filled 10/05/15 #90 with 1

## 2015-11-07 ENCOUNTER — Other Ambulatory Visit: Payer: Self-pay | Admitting: Family Medicine

## 2015-11-08 ENCOUNTER — Encounter: Payer: Self-pay | Admitting: Family Medicine

## 2015-11-08 DIAGNOSIS — G894 Chronic pain syndrome: Secondary | ICD-10-CM

## 2015-11-09 ENCOUNTER — Other Ambulatory Visit: Payer: Self-pay | Admitting: Family Medicine

## 2015-11-09 DIAGNOSIS — Z79899 Other long term (current) drug therapy: Secondary | ICD-10-CM | POA: Diagnosis not present

## 2015-11-09 DIAGNOSIS — M300 Polyarteritis nodosa: Secondary | ICD-10-CM | POA: Diagnosis not present

## 2015-11-09 MED ORDER — HYDROCODONE-ACETAMINOPHEN 7.5-325 MG PO TABS: 1.0000 | ORAL_TABLET | Freq: Four times a day (QID) | ORAL | Status: DC | PRN

## 2015-11-09 MED ORDER — ALPRAZOLAM 0.5 MG PO TABS: ORAL_TABLET | ORAL | Status: DC

## 2015-11-09 NOTE — Telephone Encounter (Signed)
Last OV 06/12/15 Hydrocodone last filled 10/07/15 #60 with 0

## 2015-11-09 NOTE — Telephone Encounter (Signed)
Ok to refill 

## 2015-11-09 NOTE — Telephone Encounter (Signed)
Pharmacy states that the Rx for xanax that was sent in on 4/17 did not have a name on it and they were unable to use it which had a refill on it. Then when Rx was sent in on 5/22 it was for 30 day supply (90 tablets) with no refills. Pharmacy states that pt needs refill

## 2015-11-09 NOTE — Telephone Encounter (Signed)
Medication filled to pharmacy as requested.   

## 2015-11-09 NOTE — Telephone Encounter (Signed)
Med denied, pt had rx filled on 10/05/15 #90 with 1... Pharmacy needs to fill the refill for pt.

## 2015-11-09 NOTE — Telephone Encounter (Signed)
Med filled and pt informed.  

## 2015-11-11 DIAGNOSIS — M791 Myalgia: Secondary | ICD-10-CM | POA: Diagnosis not present

## 2015-11-11 DIAGNOSIS — M300 Polyarteritis nodosa: Secondary | ICD-10-CM | POA: Diagnosis not present

## 2015-11-16 ENCOUNTER — Other Ambulatory Visit: Payer: Self-pay | Admitting: Family Medicine

## 2015-11-18 NOTE — Telephone Encounter (Signed)
Medication filled to pharmacy as requested.   

## 2015-11-24 ENCOUNTER — Encounter: Payer: Self-pay | Admitting: Family Medicine

## 2015-11-24 ENCOUNTER — Ambulatory Visit (INDEPENDENT_AMBULATORY_CARE_PROVIDER_SITE_OTHER): Payer: Medicare Other | Admitting: Family Medicine

## 2015-11-24 ENCOUNTER — Telehealth: Payer: Self-pay | Admitting: Family Medicine

## 2015-11-24 VITALS — BP 156/102 | HR 90 | Temp 98.2°F | Resp 16 | Ht 63.0 in | Wt 376.0 lb

## 2015-11-24 DIAGNOSIS — I10 Essential (primary) hypertension: Secondary | ICD-10-CM | POA: Diagnosis not present

## 2015-11-24 DIAGNOSIS — E785 Hyperlipidemia, unspecified: Secondary | ICD-10-CM

## 2015-11-24 DIAGNOSIS — M300 Polyarteritis nodosa: Secondary | ICD-10-CM

## 2015-11-24 DIAGNOSIS — Z0000 Encounter for general adult medical examination without abnormal findings: Secondary | ICD-10-CM | POA: Diagnosis not present

## 2015-11-24 DIAGNOSIS — Z1231 Encounter for screening mammogram for malignant neoplasm of breast: Secondary | ICD-10-CM

## 2015-11-24 LAB — BASIC METABOLIC PANEL
BUN: 10 mg/dL (ref 6–23)
CALCIUM: 9.6 mg/dL (ref 8.4–10.5)
CO2: 29 meq/L (ref 19–32)
CREATININE: 0.87 mg/dL (ref 0.40–1.20)
Chloride: 99 mEq/L (ref 96–112)
GFR: 74.51 mL/min (ref >60.00)
GLUCOSE: 127 mg/dL — AB (ref 70–99)
Potassium: 4 mEq/L (ref 3.5–5.1)
Sodium: 141 mEq/L (ref 135–145)

## 2015-11-24 LAB — CBC WITH DIFFERENTIAL/PLATELET
BASOS ABS: 0 10*3/uL (ref 0.0–0.1)
Basophils Relative: 0.4 % (ref 0.0–3.0)
EOS ABS: 0.1 10*3/uL (ref 0.0–0.7)
Eosinophils Relative: 0.9 % (ref 0.0–5.0)
HCT: 34.4 % — ABNORMAL LOW (ref 36.0–46.0)
Hemoglobin: 10.8 g/dL — ABNORMAL LOW (ref 12.0–15.0)
LYMPHS PCT: 11.8 % — AB (ref 12.0–46.0)
Lymphs Abs: 1.4 10*3/uL (ref 0.7–4.0)
MCHC: 31.5 g/dL (ref 30.0–36.0)
MCV: 84.5 fl (ref 78.0–100.0)
MONOS PCT: 4.3 % (ref 3.0–12.0)
Monocytes Absolute: 0.5 10*3/uL (ref 0.1–1.0)
NEUTROS PCT: 82.6 % — AB (ref 43.0–77.0)
Neutro Abs: 9.8 10*3/uL — ABNORMAL HIGH (ref 1.4–7.7)
PLATELETS: 303 10*3/uL (ref 150.0–400.0)
RBC: 4.07 Mil/uL (ref 3.87–5.11)
RDW: 18.8 % — ABNORMAL HIGH (ref 11.5–15.5)
WBC: 11.9 10*3/uL — ABNORMAL HIGH (ref 4.0–10.5)

## 2015-11-24 LAB — TSH: TSH: 1.78 u[IU]/mL (ref 0.35–4.50)

## 2015-11-24 LAB — LIPID PANEL
CHOLESTEROL: 153 mg/dL (ref 0–200)
HDL: 44.4 mg/dL (ref >39.00)
NonHDL: 108.22
TRIGLYCERIDES: 209 mg/dL — AB (ref 0.0–149.0)
Total CHOL/HDL Ratio: 3
VLDL: 41.8 mg/dL — AB (ref 0.0–40.0)

## 2015-11-24 LAB — HEPATIC FUNCTION PANEL
ALBUMIN: 4 g/dL (ref 3.5–5.2)
ALT: 16 U/L (ref 0–35)
AST: 13 U/L (ref 0–37)
Alkaline Phosphatase: 88 U/L (ref 39–117)
Bilirubin, Direct: 0 mg/dL (ref 0.0–0.3)
TOTAL PROTEIN: 6.6 g/dL (ref 6.0–8.3)
Total Bilirubin: 0.3 mg/dL (ref 0.2–1.2)

## 2015-11-24 LAB — LDL CHOLESTEROL, DIRECT: Direct LDL: 76 mg/dL

## 2015-11-24 MED ORDER — OXYCODONE-ACETAMINOPHEN 7.5-325 MG PO TABS: 1.0000 | ORAL_TABLET | Freq: Three times a day (TID) | ORAL | Status: DC | PRN

## 2015-11-24 MED ORDER — NEBIVOLOL HCL 10 MG PO TABS: 10.0000 mg | ORAL_TABLET | Freq: Every day | ORAL | Status: DC

## 2015-11-24 NOTE — Telephone Encounter (Signed)
Patient calling to report her rx for furosemide (LASIX) 40 MG tablet was changed from 1.5 tablets per day to 2.5 tablets per day.  She needs a new script sent to [facility] [place] - [place], [place] - [address]

## 2015-11-24 NOTE — Progress Notes (Signed)
   Subjective:    Patient ID: [name], female    DOB: 07/06/1970, 45 y.o.   MRN: [id number]  HPI Here today for CPE.  Risk Factors: HTN- chronic problem, currently on Bystolic 5mg  daily, Lasix (recently increased by [facility]).  Pt reports elevated BP at both occasions at [facility].  Denies CP, + SOB w/ activity.  Denies HAs, visual changes. Hyperlipidemia- chronic problem, on Crestor.  Denies abd pain, N/V.  Chronic myalgias. Obesity- has gained 20 lbs since November.  Not able to exercise due due chronic joint pain. PAN- cutaneous, following w/ [facility].  On Plaquenil, Cellcept.  Previously on Methotrexate.  Attempting to wean off prednisone Physical Activity: very limited Fall Risk: low Depression: chronic problem, following w/ psych.  Pt is having a hard time dealing w/ her medical complications. Hearing: normal to conversational tones and whispered voice ADL's: independent Cognitive: normal linear thought process, memory and attention intact Home Safety: safe at home Height, Weight, BMI, Visual Acuity: see vitals, vision corrected to 20/20 w/ glasses Counseling: due for mammo, no need for pap due to hysterectomy.  Due for Tdap- pt wants to ask rheum 1st due to immunosuppression Care team reviewed and updated Labs Ordered: See A&P Care Plan: See A&P    Review of Systems Patient reports no vision/ hearing changes, adenopathy,fever, weight change,  persistant/recurrent hoarseness , swallowing issues, chest pain, palpitations, edema, persistant/recurrent cough, hemoptysis, dyspnea (rest/exertional/paroxysmal nocturnal), gastrointestinal bleeding (melena, rectal bleeding), abdominal pain, significant heartburn, bowel changes, GU symptoms (dysuria, hematuria, incontinence), Gyn symptoms (abnormal  bleeding, pain),  syncope, focal weakness, memory loss, numbness & tingling, skin/hair/nail changes, abnormal bleeding  + bruising from prednisone    Objective:   Physical Exam General Appearance:     Alert, cooperative, no distress, appears stated age, obese  Head:    Normocephalic, temporal wasting, atraumatic  Eyes:    PERRL, conjunctiva/corneas clear, EOM's intact, fundi    benign, both eyes  Ears:    Normal TM's and external ear canals, both ears  Nose:   Nares normal, septum midline, mucosa normal, no drainage    or sinus tenderness  Throat:   Lips, mucosa, and tongue normal; teeth and gums normal  Neck:   Supple, symmetrical, trachea midline, no adenopathy;    Thyroid: no enlargement/tenderness/nodules  Back:     Symmetric, no curvature, ROM normal, no CVA tenderness  Lungs:     Clear to auscultation bilaterally, respirations unlabored  Chest Wall:    No tenderness or deformity   Heart:    Regular rate and rhythm, S1 and S2 normal, no murmur, rub   or gallop  Breast Exam:    Deferred to GYN  Abdomen:     Soft, non-tender, bowel sounds active all four quadrants,    no masses, no organomegaly, obese  Genitalia:    Deferred to GYN  Rectal:    Extremities:   Extremities normal, atraumatic, no cyanosis, 2+ edema of LEs bilaterally  Pulses:   2+ and symmetric all extremities  Skin:   Redness of lower legs bilaterally, texture, turgor normal, no rashes or lesions  Lymph nodes:   Cervical, supraclavicular, and axillary nodes normal  Neurologic:   CNII-XII intact, normal strength, sensation and reflexes    throughout          Assessment & Plan:

## 2015-11-24 NOTE — Patient Instructions (Signed)
Follow up in 1 month to recheck BP Increase the Bystolic to 10mg  daily Try and limit your salt intake to improve BP and swelling Take the Percocet as needed for severe pain Try and make healthy food choices and get regular activity as able The order for mammo is in- if [facility] don't [facility] in the next week- let me know! No need for pap due to hysterectomy Ask Rheumatology about the Tdap shot Call with any questions or concerns Hang in there!!!

## 2015-11-25 ENCOUNTER — Other Ambulatory Visit (INDEPENDENT_AMBULATORY_CARE_PROVIDER_SITE_OTHER): Payer: Medicare Other

## 2015-11-25 DIAGNOSIS — R7309 Other abnormal glucose: Secondary | ICD-10-CM | POA: Diagnosis not present

## 2015-11-25 LAB — HEMOGLOBIN A1C: Hgb A1c MFr Bld: 6.1 % (ref 4.6–6.5)

## 2015-11-25 MED ORDER — FUROSEMIDE 40 MG PO TABS: ORAL_TABLET | ORAL | Status: DC

## 2015-11-25 NOTE — Telephone Encounter (Signed)
Medication filled to pharmacy as requested.   

## 2015-11-25 NOTE — Telephone Encounter (Signed)
Do you want her to continue this dose?

## 2015-11-25 NOTE — Telephone Encounter (Signed)
Ok to change Lasix as recommended by [facility]- please send script

## 2015-11-26 ENCOUNTER — Encounter: Payer: Self-pay | Admitting: Family Medicine

## 2015-12-01 ENCOUNTER — Other Ambulatory Visit: Payer: Self-pay | Admitting: Family Medicine

## 2015-12-01 NOTE — Telephone Encounter (Signed)
Medication filled to pharmacy as requested.   

## 2015-12-08 ENCOUNTER — Other Ambulatory Visit: Payer: Self-pay | Admitting: Family Medicine

## 2015-12-08 NOTE — Telephone Encounter (Signed)
Medication filled to pharmacy as requested.   

## 2015-12-08 NOTE — Telephone Encounter (Signed)
Last OV 11/24/15 Alprazolam last filled 11/09/15 #90 with 0

## 2015-12-09 DIAGNOSIS — Z79899 Other long term (current) drug therapy: Secondary | ICD-10-CM | POA: Diagnosis not present

## 2015-12-09 DIAGNOSIS — H527 Unspecified disorder of refraction: Secondary | ICD-10-CM | POA: Diagnosis not present

## 2015-12-09 DIAGNOSIS — I872 Venous insufficiency (chronic) (peripheral): Secondary | ICD-10-CM | POA: Diagnosis not present

## 2015-12-09 DIAGNOSIS — I890 Lymphedema, not elsewhere classified: Secondary | ICD-10-CM | POA: Diagnosis not present

## 2015-12-09 DIAGNOSIS — H04123 Dry eye syndrome of bilateral lacrimal glands: Secondary | ICD-10-CM | POA: Diagnosis not present

## 2015-12-09 DIAGNOSIS — M300 Polyarteritis nodosa: Secondary | ICD-10-CM | POA: Diagnosis not present

## 2015-12-10 ENCOUNTER — Other Ambulatory Visit: Payer: Self-pay | Admitting: Family Medicine

## 2015-12-21 ENCOUNTER — Other Ambulatory Visit: Payer: Self-pay | Admitting: Family Medicine

## 2015-12-22 MED ORDER — OXYCODONE-ACETAMINOPHEN 7.5-325 MG PO TABS: 1.0000 | ORAL_TABLET | Freq: Three times a day (TID) | ORAL | 0 refills | Status: DC | PRN

## 2015-12-22 NOTE — Telephone Encounter (Signed)
Last OV 11/24/15 Oxycodone last filled 11/24/15 #60 with 0

## 2015-12-22 NOTE — Telephone Encounter (Signed)
Med filled and pt made aware available for pick up at front desk of [place] location.

## 2015-12-28 ENCOUNTER — Encounter: Payer: Self-pay | Admitting: General Practice

## 2015-12-28 ENCOUNTER — Ambulatory Visit
Admission: RE | Admit: 2015-12-28 | Discharge: 2015-12-28 | Disposition: A | Discharge status: Home / Self Care | Payer: Medicare Other | Source: Ambulatory Visit | Attending: Family Medicine | Admitting: Family Medicine

## 2015-12-28 ENCOUNTER — Ambulatory Visit (INDEPENDENT_AMBULATORY_CARE_PROVIDER_SITE_OTHER): Payer: Medicare Other | Admitting: Family Medicine

## 2015-12-28 ENCOUNTER — Encounter: Payer: Self-pay | Admitting: Family Medicine

## 2015-12-28 VITALS — BP 138/88 | HR 86 | Temp 98.0°F | Resp 16 | Ht 63.0 in | Wt 370.1 lb

## 2015-12-28 DIAGNOSIS — Z1231 Encounter for screening mammogram for malignant neoplasm of breast: Secondary | ICD-10-CM | POA: Diagnosis not present

## 2015-12-28 DIAGNOSIS — I10 Essential (primary) hypertension: Secondary | ICD-10-CM | POA: Diagnosis not present

## 2015-12-28 NOTE — Patient Instructions (Addendum)
Follow up in 6 months to recheck BP and cholesterol Continue to work on healthy diet and regular activity- you can do it! The blood pressure looks MUCH better!  This is great news!!! Call with any questions or concerns Hang in there!!!

## 2015-12-28 NOTE — Progress Notes (Signed)
   Subjective:    Patient ID: [name], female    DOB: [birth year], 45 y.o.   MRN: [id number]  HPI HTN- chronic problem, BP was elevated at last visit.  Bystolic was increased to 10mg  daily.  Much better control today.  Pt has lost 6 lbs since last visit.  Denies CP, SOB, HAs, visual changes.   Review of Systems For ROS see HPI     Objective:   Physical Exam  Constitutional: She is oriented to person, place, and time. She appears well-developed and well-nourished. No distress.  obese  HENT:  Head: Normocephalic and atraumatic.  Eyes: Conjunctivae and EOM are normal. Pupils are equal, round, and reactive to light.  Neck: Normal range of motion. Neck supple. No thyromegaly present.  Cardiovascular: Normal rate, regular rhythm, normal heart sounds and intact distal pulses.   No murmur heard. Pulmonary/Chest: Effort normal and breath sounds normal. No respiratory distress.  Abdominal: Soft. She exhibits no distension. There is no tenderness.  Musculoskeletal: She exhibits no edema.  Lymphadenopathy:    She has no cervical adenopathy.  Neurological: She is alert and oriented to person, place, and time.  Skin: Skin is warm and dry.  Psychiatric: She has a normal mood and affect. Her behavior is normal.  Vitals reviewed.         Assessment & Plan:

## 2015-12-28 NOTE — Progress Notes (Signed)
Pre visit review using our clinic review tool, if applicable. No additional management support is needed unless otherwise documented below in the visit note. 

## 2015-12-28 NOTE — Assessment & Plan Note (Signed)
Chronic problem.  Improved BP control since increasing Bystolic to 10mg  daily.  Asymptomatic.  Will continue to follow.

## 2015-12-30 ENCOUNTER — Other Ambulatory Visit: Payer: Self-pay | Admitting: Family Medicine

## 2015-12-30 DIAGNOSIS — R928 Other abnormal and inconclusive findings on diagnostic imaging of breast: Secondary | ICD-10-CM

## 2016-01-05 DIAGNOSIS — Z881 Allergy status to other antibiotic agents status: Secondary | ICD-10-CM | POA: Diagnosis not present

## 2016-01-05 DIAGNOSIS — M7989 Other specified soft tissue disorders: Secondary | ICD-10-CM | POA: Diagnosis not present

## 2016-01-05 DIAGNOSIS — E785 Hyperlipidemia, unspecified: Secondary | ICD-10-CM | POA: Diagnosis not present

## 2016-01-05 DIAGNOSIS — M797 Fibromyalgia: Secondary | ICD-10-CM | POA: Diagnosis not present

## 2016-01-05 DIAGNOSIS — S39012A Strain of muscle, fascia and tendon of lower back, initial encounter: Secondary | ICD-10-CM | POA: Diagnosis not present

## 2016-01-05 DIAGNOSIS — Z23 Encounter for immunization: Secondary | ICD-10-CM | POA: Diagnosis not present

## 2016-01-05 DIAGNOSIS — Z882 Allergy status to sulfonamides status: Secondary | ICD-10-CM | POA: Diagnosis not present

## 2016-01-05 DIAGNOSIS — Z7952 Long term (current) use of systemic steroids: Secondary | ICD-10-CM | POA: Diagnosis not present

## 2016-01-05 DIAGNOSIS — I10 Essential (primary) hypertension: Secondary | ICD-10-CM | POA: Diagnosis not present

## 2016-01-05 DIAGNOSIS — F329 Major depressive disorder, single episode, unspecified: Secondary | ICD-10-CM | POA: Diagnosis not present

## 2016-01-05 DIAGNOSIS — G629 Polyneuropathy, unspecified: Secondary | ICD-10-CM | POA: Diagnosis not present

## 2016-01-05 DIAGNOSIS — Z7982 Long term (current) use of aspirin: Secondary | ICD-10-CM | POA: Diagnosis not present

## 2016-01-05 DIAGNOSIS — G4733 Obstructive sleep apnea (adult) (pediatric): Secondary | ICD-10-CM | POA: Diagnosis not present

## 2016-01-05 DIAGNOSIS — E559 Vitamin D deficiency, unspecified: Secondary | ICD-10-CM | POA: Diagnosis not present

## 2016-01-05 DIAGNOSIS — Z79899 Other long term (current) drug therapy: Secondary | ICD-10-CM | POA: Diagnosis not present

## 2016-01-05 DIAGNOSIS — Z886 Allergy status to analgesic agent status: Secondary | ICD-10-CM | POA: Diagnosis not present

## 2016-01-05 DIAGNOSIS — Z888 Allergy status to other drugs, medicaments and biological substances status: Secondary | ICD-10-CM | POA: Diagnosis not present

## 2016-01-05 DIAGNOSIS — M300 Polyarteritis nodosa: Secondary | ICD-10-CM | POA: Diagnosis not present

## 2016-01-05 DIAGNOSIS — Z9884 Bariatric surgery status: Secondary | ICD-10-CM | POA: Diagnosis not present

## 2016-01-05 DIAGNOSIS — K219 Gastro-esophageal reflux disease without esophagitis: Secondary | ICD-10-CM | POA: Diagnosis not present

## 2016-01-06 ENCOUNTER — Ambulatory Visit
Admission: RE | Admit: 2016-01-06 | Discharge: 2016-01-06 | Disposition: A | Discharge status: Home / Self Care | Payer: Medicare Other | Source: Ambulatory Visit | Attending: Family Medicine | Admitting: Family Medicine

## 2016-01-06 DIAGNOSIS — N6489 Other specified disorders of breast: Secondary | ICD-10-CM | POA: Diagnosis not present

## 2016-01-06 DIAGNOSIS — R928 Other abnormal and inconclusive findings on diagnostic imaging of breast: Secondary | ICD-10-CM

## 2016-01-08 ENCOUNTER — Other Ambulatory Visit: Payer: Self-pay | Admitting: General Practice

## 2016-01-08 ENCOUNTER — Other Ambulatory Visit: Payer: Self-pay | Admitting: Family Medicine

## 2016-01-08 MED ORDER — CYCLOBENZAPRINE HCL 10 MG PO TABS: 10.0000 mg | ORAL_TABLET | Freq: Three times a day (TID) | ORAL | 0 refills | Status: DC | PRN

## 2016-01-08 MED ORDER — FUROSEMIDE 40 MG PO TABS: 60.0000 mg | ORAL_TABLET | Freq: Every day | ORAL | 0 refills | Status: DC

## 2016-01-13 DIAGNOSIS — Z79891 Long term (current) use of opiate analgesic: Secondary | ICD-10-CM | POA: Diagnosis not present

## 2016-01-13 DIAGNOSIS — M797 Fibromyalgia: Secondary | ICD-10-CM | POA: Diagnosis not present

## 2016-01-13 DIAGNOSIS — D649 Anemia, unspecified: Secondary | ICD-10-CM | POA: Diagnosis not present

## 2016-01-13 DIAGNOSIS — K625 Hemorrhage of anus and rectum: Secondary | ICD-10-CM | POA: Diagnosis not present

## 2016-01-13 DIAGNOSIS — Z882 Allergy status to sulfonamides status: Secondary | ICD-10-CM | POA: Diagnosis not present

## 2016-01-13 DIAGNOSIS — Z7952 Long term (current) use of systemic steroids: Secondary | ICD-10-CM | POA: Diagnosis not present

## 2016-01-14 ENCOUNTER — Inpatient Hospital Stay (HOSPITAL_COMMUNITY): Payer: Medicare Other | Admitting: Anesthesiology

## 2016-01-14 ENCOUNTER — Encounter (HOSPITAL_COMMUNITY): Payer: Self-pay | Admitting: Internal Medicine

## 2016-01-14 ENCOUNTER — Inpatient Hospital Stay (HOSPITAL_COMMUNITY)
Admission: AD | Admit: 2016-01-14 | Discharge: 2016-01-15 | DRG: 378 | Disposition: A | Discharge status: Home / Self Care | Payer: Medicare Other | Source: Other Acute Inpatient Hospital | Attending: Internal Medicine | Admitting: Internal Medicine

## 2016-01-14 ENCOUNTER — Encounter (HOSPITAL_COMMUNITY)
Admission: AD | Disposition: A | Discharge status: Home / Self Care | Payer: Self-pay | Source: Other Acute Inpatient Hospital | Attending: Internal Medicine

## 2016-01-14 DIAGNOSIS — E785 Hyperlipidemia, unspecified: Secondary | ICD-10-CM | POA: Diagnosis present

## 2016-01-14 DIAGNOSIS — Z825 Family history of asthma and other chronic lower respiratory diseases: Secondary | ICD-10-CM | POA: Diagnosis not present

## 2016-01-14 DIAGNOSIS — I10 Essential (primary) hypertension: Secondary | ICD-10-CM | POA: Diagnosis not present

## 2016-01-14 DIAGNOSIS — M300 Polyarteritis nodosa: Secondary | ICD-10-CM | POA: Diagnosis not present

## 2016-01-14 DIAGNOSIS — Z6841 Body Mass Index (BMI) 40.0 and over, adult: Secondary | ICD-10-CM | POA: Diagnosis not present

## 2016-01-14 DIAGNOSIS — Z8249 Family history of ischemic heart disease and other diseases of the circulatory system: Secondary | ICD-10-CM | POA: Diagnosis not present

## 2016-01-14 DIAGNOSIS — G4733 Obstructive sleep apnea (adult) (pediatric): Secondary | ICD-10-CM | POA: Diagnosis present

## 2016-01-14 DIAGNOSIS — K625 Hemorrhage of anus and rectum: Secondary | ICD-10-CM

## 2016-01-14 DIAGNOSIS — K254 Chronic or unspecified gastric ulcer with hemorrhage: Secondary | ICD-10-CM | POA: Diagnosis present

## 2016-01-14 DIAGNOSIS — Z803 Family history of malignant neoplasm of breast: Secondary | ICD-10-CM | POA: Diagnosis not present

## 2016-01-14 DIAGNOSIS — K219 Gastro-esophageal reflux disease without esophagitis: Secondary | ICD-10-CM | POA: Diagnosis present

## 2016-01-14 DIAGNOSIS — K259 Gastric ulcer, unspecified as acute or chronic, without hemorrhage or perforation: Secondary | ICD-10-CM

## 2016-01-14 DIAGNOSIS — D62 Acute posthemorrhagic anemia: Secondary | ICD-10-CM | POA: Diagnosis not present

## 2016-01-14 DIAGNOSIS — K760 Fatty (change of) liver, not elsewhere classified: Secondary | ICD-10-CM | POA: Diagnosis present

## 2016-01-14 DIAGNOSIS — Z9884 Bariatric surgery status: Secondary | ICD-10-CM | POA: Diagnosis not present

## 2016-01-14 DIAGNOSIS — M797 Fibromyalgia: Secondary | ICD-10-CM | POA: Diagnosis present

## 2016-01-14 DIAGNOSIS — K226 Gastro-esophageal laceration-hemorrhage syndrome: Secondary | ICD-10-CM | POA: Diagnosis present

## 2016-01-14 DIAGNOSIS — Z7952 Long term (current) use of systemic steroids: Secondary | ICD-10-CM

## 2016-01-14 DIAGNOSIS — M62830 Muscle spasm of back: Secondary | ICD-10-CM | POA: Diagnosis present

## 2016-01-14 DIAGNOSIS — Z9049 Acquired absence of other specified parts of digestive tract: Secondary | ICD-10-CM | POA: Diagnosis not present

## 2016-01-14 DIAGNOSIS — Z833 Family history of diabetes mellitus: Secondary | ICD-10-CM

## 2016-01-14 DIAGNOSIS — K921 Melena: Secondary | ICD-10-CM

## 2016-01-14 DIAGNOSIS — Z90710 Acquired absence of both cervix and uterus: Secondary | ICD-10-CM | POA: Diagnosis not present

## 2016-01-14 DIAGNOSIS — R195 Other fecal abnormalities: Secondary | ICD-10-CM | POA: Diagnosis not present

## 2016-01-14 DIAGNOSIS — G8929 Other chronic pain: Secondary | ICD-10-CM | POA: Diagnosis present

## 2016-01-14 DIAGNOSIS — M549 Dorsalgia, unspecified: Secondary | ICD-10-CM | POA: Diagnosis present

## 2016-01-14 DIAGNOSIS — Z8541 Personal history of malignant neoplasm of cervix uteri: Secondary | ICD-10-CM

## 2016-01-14 DIAGNOSIS — K922 Gastrointestinal hemorrhage, unspecified: Secondary | ICD-10-CM | POA: Diagnosis not present

## 2016-01-14 DIAGNOSIS — I872 Venous insufficiency (chronic) (peripheral): Secondary | ICD-10-CM | POA: Diagnosis present

## 2016-01-14 DIAGNOSIS — Z79899 Other long term (current) drug therapy: Secondary | ICD-10-CM

## 2016-01-14 HISTORY — PX: ESOPHAGOGASTRODUODENOSCOPY: SHX5428

## 2016-01-14 LAB — CBC
HEMATOCRIT: 22.5 % — AB (ref 36.0–46.0)
HEMATOCRIT: 24.8 % — AB (ref 36.0–46.0)
HEMATOCRIT: 29.5 % — AB (ref 36.0–46.0)
HEMOGLOBIN: 8.7 g/dL — AB (ref 12.0–15.0)
Hemoglobin: 6.5 g/dL — CL (ref 12.0–15.0)
Hemoglobin: 7.1 g/dL — ABNORMAL LOW (ref 12.0–15.0)
MCH: 25.9 pg — ABNORMAL LOW (ref 26.0–34.0)
MCH: 25.5 pg — ABNORMAL LOW (ref 26.0–34.0)
MCH: 26.3 pg (ref 26.0–34.0)
MCHC: 28.9 g/dL — AB (ref 30.0–36.0)
MCHC: 28.6 g/dL — ABNORMAL LOW (ref 30.0–36.0)
MCHC: 29.5 g/dL — AB (ref 30.0–36.0)
MCV: 89.6 fL (ref 78.0–100.0)
MCV: 89.2 fL (ref 78.0–100.0)
MCV: 89.1 fL (ref 78.0–100.0)
PLATELETS: 305 10*3/uL (ref 150–400)
PLATELETS: 299 10*3/uL (ref 150–400)
Platelets: 326 10*3/uL (ref 150–400)
RBC: 2.51 MIL/uL — ABNORMAL LOW (ref 3.87–5.11)
RBC: 2.78 MIL/uL — ABNORMAL LOW (ref 3.87–5.11)
RBC: 3.31 MIL/uL — AB (ref 3.87–5.11)
RDW: 16.7 % — AB (ref 11.5–15.5)
RDW: 16.9 % — AB (ref 11.5–15.5)
RDW: 16.8 % — ABNORMAL HIGH (ref 11.5–15.5)
WBC: 10.1 10*3/uL (ref 4.0–10.5)
WBC: 9.2 10*3/uL (ref 4.0–10.5)
WBC: 11.9 10*3/uL — ABNORMAL HIGH (ref 4.0–10.5)

## 2016-01-14 LAB — COMPREHENSIVE METABOLIC PANEL
ALT: 13 U/L — ABNORMAL LOW (ref 14–54)
ANION GAP: 6 (ref 5–15)
AST: 14 U/L — ABNORMAL LOW (ref 15–41)
Albumin: 3.1 g/dL — ABNORMAL LOW (ref 3.5–5.0)
Alkaline Phosphatase: 70 U/L (ref 38–126)
BILIRUBIN TOTAL: 0.4 mg/dL (ref 0.3–1.2)
BUN: 11 mg/dL (ref 6–20)
CO2: 27 mmol/L (ref 22–32)
Calcium: 8.6 mg/dL — ABNORMAL LOW (ref 8.9–10.3)
Chloride: 106 mmol/L (ref 101–111)
Creatinine, Ser: 0.66 mg/dL (ref 0.44–1.00)
GFR calc Af Amer: >60 mL/min (ref >60)
Glucose, Bld: 88 mg/dL (ref 65–99)
POTASSIUM: 4.3 mmol/L (ref 3.5–5.1)
Sodium: 139 mmol/L (ref 135–145)
TOTAL PROTEIN: 5.2 g/dL — AB (ref 6.5–8.1)

## 2016-01-14 LAB — HEMOGLOBIN AND HEMATOCRIT, BLOOD
HCT: 24.5 % — ABNORMAL LOW (ref 36.0–46.0)
HEMOGLOBIN: 7.2 g/dL — AB (ref 12.0–15.0)

## 2016-01-14 LAB — PREPARE RBC (CROSSMATCH)

## 2016-01-14 LAB — MAGNESIUM: MAGNESIUM: 2.3 mg/dL (ref 1.7–2.4)

## 2016-01-14 LAB — TSH: TSH: 2.943 u[IU]/mL (ref 0.350–4.500)

## 2016-01-14 LAB — ABO/RH: ABO/RH(D): O POS

## 2016-01-14 LAB — PHOSPHORUS: Phosphorus: 4.6 mg/dL (ref 2.5–4.6)

## 2016-01-14 LAB — MRSA PCR SCREENING: MRSA BY PCR: POSITIVE — AB

## 2016-01-14 LAB — APTT: APTT: 26 s (ref 24–36)

## 2016-01-14 SURGERY — EGD (ESOPHAGOGASTRODUODENOSCOPY)
Anesthesia: Monitor Anesthesia Care

## 2016-01-14 MED ORDER — ONDANSETRON HCL 4 MG/2ML IJ SOLN: 4.0000 mg | Freq: Four times a day (QID) | INTRAMUSCULAR | Status: DC | PRN

## 2016-01-14 MED ORDER — LAMOTRIGINE 100 MG PO TABS
200.0000 mg | ORAL_TABLET | Freq: Two times a day (BID) | ORAL | Status: DC
Start: 2016-01-14 — End: 2016-01-15
  Administered 2016-01-14 – 2016-01-15 (×2): 200 mg via ORAL
  Filled 2016-01-14 (×2): qty 2

## 2016-01-14 MED ORDER — MYCOPHENOLATE MOFETIL 250 MG PO CAPS
1500.0000 mg | ORAL_CAPSULE | Freq: Two times a day (BID) | ORAL | Status: DC
  Administered 2016-01-14 – 2016-01-15 (×3): 1500 mg via ORAL
  Filled 2016-01-14 (×4): qty 6

## 2016-01-14 MED ORDER — OXYCODONE-ACETAMINOPHEN 5-325 MG PO TABS
1.0000 | ORAL_TABLET | Freq: Four times a day (QID) | ORAL | Status: DC | PRN
  Administered 2016-01-14: 1 via ORAL
  Filled 2016-01-14: qty 1

## 2016-01-14 MED ORDER — SODIUM CHLORIDE 0.9 % IV SOLN
INTRAVENOUS | Status: DC | PRN
  Administered 2016-01-14: 11:00:00 via INTRAVENOUS

## 2016-01-14 MED ORDER — CLONAZEPAM 1 MG PO TABS: 1.0000 mg | ORAL_TABLET | Freq: Every evening | ORAL | Status: DC | PRN

## 2016-01-14 MED ORDER — BUTAMBEN-TETRACAINE-BENZOCAINE 2-2-14 % EX AERO
INHALATION_SPRAY | CUTANEOUS | Status: DC | PRN
  Administered 2016-01-14: 1 via TOPICAL

## 2016-01-14 MED ORDER — GABAPENTIN 100 MG PO CAPS: 200.0000 mg | ORAL_CAPSULE | Freq: Three times a day (TID) | ORAL | Status: DC | PRN

## 2016-01-14 MED ORDER — SODIUM CHLORIDE 0.9 % IV SOLN: INTRAVENOUS | Status: DC

## 2016-01-14 MED ORDER — PANTOPRAZOLE SODIUM 40 MG IV SOLR
40.0000 mg | Freq: Two times a day (BID) | INTRAVENOUS | Status: DC
  Administered 2016-01-14: 40 mg via INTRAVENOUS
  Filled 2016-01-14: qty 40

## 2016-01-14 MED ORDER — ACETAMINOPHEN 325 MG PO TABS: 650.0000 mg | ORAL_TABLET | Freq: Four times a day (QID) | ORAL | Status: DC | PRN

## 2016-01-14 MED ORDER — PROPOFOL 500 MG/50ML IV EMUL
INTRAVENOUS | Status: DC | PRN
  Administered 2016-01-14: 50 ug/kg/min via INTRAVENOUS

## 2016-01-14 MED ORDER — METHOCARBAMOL 500 MG PO TABS
500.0000 mg | ORAL_TABLET | Freq: Four times a day (QID) | ORAL | Status: DC | PRN
  Administered 2016-01-14: 500 mg via ORAL
  Filled 2016-01-14: qty 1

## 2016-01-14 MED ORDER — NEBIVOLOL HCL 2.5 MG PO TABS
10.0000 mg | ORAL_TABLET | Freq: Every day | ORAL | Status: DC
  Administered 2016-01-14 – 2016-01-15 (×2): 10 mg via ORAL
  Filled 2016-01-14 (×2): qty 4

## 2016-01-14 MED ORDER — DULOXETINE HCL 60 MG PO CPEP
120.0000 mg | ORAL_CAPSULE | Freq: Every day | ORAL | Status: DC
  Administered 2016-01-15: 120 mg via ORAL
  Filled 2016-01-14 (×2): qty 2

## 2016-01-14 MED ORDER — SODIUM CHLORIDE 0.9% FLUSH
3.0000 mL | Freq: Two times a day (BID) | INTRAVENOUS | Status: DC
  Administered 2016-01-14 – 2016-01-15 (×2): 3 mL via INTRAVENOUS

## 2016-01-14 MED ORDER — ONDANSETRON HCL 4 MG PO TABS: 4.0000 mg | ORAL_TABLET | Freq: Four times a day (QID) | ORAL | Status: DC | PRN

## 2016-01-14 MED ORDER — SODIUM CHLORIDE 0.9 % IV SOLN
Freq: Once | INTRAVENOUS | Status: AC
  Administered 2016-01-14: 02:00:00 via INTRAVENOUS

## 2016-01-14 MED ORDER — PANTOPRAZOLE SODIUM 40 MG PO TBEC
40.0000 mg | DELAYED_RELEASE_TABLET | Freq: Two times a day (BID) | ORAL | Status: DC
  Administered 2016-01-14 – 2016-01-15 (×2): 40 mg via ORAL
  Filled 2016-01-14 (×2): qty 1

## 2016-01-14 MED ORDER — ONDANSETRON HCL 4 MG/2ML IJ SOLN
INTRAMUSCULAR | Status: DC | PRN
  Administered 2016-01-14: 4 mg via INTRAVENOUS

## 2016-01-14 MED ORDER — ROSUVASTATIN CALCIUM 20 MG PO TABS
20.0000 mg | ORAL_TABLET | Freq: Every day | ORAL | Status: DC
  Administered 2016-01-15: 20 mg via ORAL
  Filled 2016-01-14: qty 2
  Filled 2016-01-14: qty 1

## 2016-01-14 MED ORDER — SODIUM CHLORIDE 0.9 % IV SOLN: Freq: Once | INTRAVENOUS | Status: DC

## 2016-01-14 MED ORDER — HYDROXYCHLOROQUINE SULFATE 200 MG PO TABS
200.0000 mg | ORAL_TABLET | Freq: Two times a day (BID) | ORAL | Status: DC
  Administered 2016-01-14 – 2016-01-15 (×3): 200 mg via ORAL
  Filled 2016-01-14 (×4): qty 1

## 2016-01-14 MED ORDER — MUPIROCIN 2 % EX OINT
1.0000 "application " | TOPICAL_OINTMENT | Freq: Two times a day (BID) | CUTANEOUS | Status: DC
  Administered 2016-01-14 – 2016-01-15 (×2): 1 via NASAL
  Filled 2016-01-14: qty 22

## 2016-01-14 MED ORDER — DIPHENHYDRAMINE HCL 25 MG PO CAPS
25.0000 mg | ORAL_CAPSULE | Freq: Once | ORAL | Status: AC
  Administered 2016-01-14: 25 mg via ORAL
  Filled 2016-01-14: qty 1

## 2016-01-14 MED ORDER — LIDOCAINE HCL (CARDIAC) 20 MG/ML IV SOLN
INTRAVENOUS | Status: DC | PRN
  Administered 2016-01-14: 100 mg via INTRATRACHEAL

## 2016-01-14 MED ORDER — LAMOTRIGINE 100 MG PO TABS
200.0000 mg | ORAL_TABLET | Freq: Every day | ORAL | Status: DC
  Filled 2016-01-14: qty 1

## 2016-01-14 MED ORDER — CHLORHEXIDINE GLUCONATE CLOTH 2 % EX PADS
6.0000 | MEDICATED_PAD | Freq: Every day | CUTANEOUS | Status: DC
Start: 2016-01-14 — End: 2016-01-15
  Administered 2016-01-14 – 2016-01-15 (×2): 6 via TOPICAL

## 2016-01-14 MED ORDER — ACETAMINOPHEN 650 MG RE SUPP: 650.0000 mg | Freq: Four times a day (QID) | RECTAL | Status: DC | PRN

## 2016-01-14 MED ORDER — ACETAMINOPHEN 325 MG PO TABS
650.0000 mg | ORAL_TABLET | Freq: Once | ORAL | Status: AC
  Administered 2016-01-14: 650 mg via ORAL
  Filled 2016-01-14: qty 2

## 2016-01-14 MED ORDER — PANTOPRAZOLE SODIUM 40 MG PO TBEC: 40.0000 mg | DELAYED_RELEASE_TABLET | Freq: Every day | ORAL | Status: DC

## 2016-01-14 MED ORDER — ALPRAZOLAM 0.5 MG PO TABS
0.5000 mg | ORAL_TABLET | Freq: Two times a day (BID) | ORAL | Status: DC | PRN
  Administered 2016-01-14 (×2): 0.5 mg via ORAL
  Filled 2016-01-14 (×2): qty 1

## 2016-01-14 MED ORDER — SUCRALFATE 1 G PO TABS
1.0000 g | ORAL_TABLET | Freq: Three times a day (TID) | ORAL | Status: DC
  Administered 2016-01-14 – 2016-01-15 (×4): 1 g via ORAL
  Filled 2016-01-14 (×4): qty 1

## 2016-01-14 MED ORDER — PREDNISONE 5 MG PO TABS
15.0000 mg | ORAL_TABLET | Freq: Every day | ORAL | Status: DC
  Administered 2016-01-14 – 2016-01-15 (×2): 15 mg via ORAL
  Filled 2016-01-14: qty 2
  Filled 2016-01-14: qty 1

## 2016-01-14 MED ORDER — DIPHENHYDRAMINE HCL 50 MG/ML IJ SOLN
25.0000 mg | Freq: Once | INTRAMUSCULAR | Status: AC
  Administered 2016-01-14: 25 mg via INTRAVENOUS
  Filled 2016-01-14: qty 1

## 2016-01-14 MED ORDER — OXYCODONE-ACETAMINOPHEN 5-325 MG PO TABS
1.0000 | ORAL_TABLET | Freq: Four times a day (QID) | ORAL | Status: DC | PRN
  Administered 2016-01-14 – 2016-01-15 (×5): 2 via ORAL
  Filled 2016-01-14 (×5): qty 2

## 2016-01-14 MED ORDER — SODIUM CHLORIDE 0.9 % IV SOLN
INTRAVENOUS | Status: AC
  Administered 2016-01-14: 01:00:00 via INTRAVENOUS

## 2016-01-14 MED ORDER — PREGABALIN 25 MG PO CAPS: 100.0000 mg | ORAL_CAPSULE | Freq: Two times a day (BID) | ORAL | Status: DC

## 2016-01-14 MED ORDER — ARIPIPRAZOLE 10 MG PO TABS
10.0000 mg | ORAL_TABLET | Freq: Every day | ORAL | Status: DC
  Administered 2016-01-15: 10 mg via ORAL
  Filled 2016-01-14 (×2): qty 1

## 2016-01-14 MED ORDER — PROPOFOL 10 MG/ML IV BOLUS
INTRAVENOUS | Status: DC | PRN
  Administered 2016-01-14: 20 mg via INTRAVENOUS
  Administered 2016-01-14: 60 mg via INTRAVENOUS
  Administered 2016-01-14: 40 mg via INTRAVENOUS
  Administered 2016-01-14: 30 mg via INTRAVENOUS

## 2016-01-14 NOTE — Anesthesia Procedure Notes (Signed)
Procedure Name: MAC Date/Time: 01/14/2016 11:47 AM Performed by: Jacquiline Doe A Pre-anesthesia Checklist: Patient identified, Emergency Drugs available, Suction available and Patient being monitored Patient Re-evaluated:Patient Re-evaluated prior to inductionOxygen Delivery Method: Nasal cannula Intubation Type: IV induction Airway Equipment and Method: Patient positioned with wedge pillow and Bite block Placement Confirmation: positive ETCO2 Dental Injury: Teeth and Oropharynx as per pre-operative assessment

## 2016-01-14 NOTE — Anesthesia Preprocedure Evaluation (Addendum)
Anesthesia Evaluation  Patient identified by MRN, date of birth, ID band Patient awake    Reviewed: Allergy & Precautions, NPO status , Patient's Chart, lab work & pertinent test results  Airway Mallampati: II  TM Distance: >3 FB Neck ROM: Full    Dental  (+) Teeth Intact   Pulmonary    breath sounds clear to auscultation       Cardiovascular hypertension,  Rhythm:Regular Rate:Normal     Neuro/Psych    GI/Hepatic   Endo/Other    Renal/GU      Musculoskeletal   Abdominal (+) + obese,   Peds  Hematology   Anesthesia Other Findings   Reproductive/Obstetrics                             Anesthesia Physical Anesthesia Plan  ASA: III  Anesthesia Plan: MAC   Post-op Pain Management:    Induction: Intravenous  Airway Management Planned: Natural Airway and Nasal Cannula  Additional Equipment:   Intra-op Plan:   Post-operative Plan:   Informed Consent: I have reviewed the patients History and Physical, chart, labs and discussed the procedure including the risks, benefits and alternatives for the proposed anesthesia with the patient or authorized representative who has indicated his/her understanding and acceptance.     Plan Discussed with: CRNA and Anesthesiologist  Anesthesia Plan Comments:        Anesthesia Quick Evaluation

## 2016-01-14 NOTE — Op Note (Signed)
[facility] Patient Name: [name] Procedure Date : 01/14/2016 MRN: [id number] Attending MD: Mauri Pole , MD Date of Birth: 1971-03-02 CSN: [id number] Age: 45 Admit Type: Inpatient Procedure:                Upper GI endoscopy Indications:              Active gastrointestinal bleeding, Suspected upper                            gastrointestinal bleeding Providers:                Mauri Pole, MD, Cleda Daub, RN, Elspeth Cho Tech., Technician, Jacquiline Doe, CRNA Referring MD:              Medicines:                Monitored Anesthesia Care Complications:            No immediate complications. Estimated Blood Loss:     Estimated blood loss was minimal. Procedure:                Pre-Anesthesia Assessment:                           - Prior to the procedure, a History and Physical                            was performed, and patient medications and                            allergies were reviewed. The patient's tolerance of                            previous anesthesia was also reviewed. The risks                            and benefits of the procedure and the sedation                            options and risks were discussed with the patient.                            All questions were answered, and informed consent                            was obtained. Prior Anticoagulants: The patient                            last took aspirin 1 day prior to the procedure. ASA                            Grade Assessment: III - A patient with severe  systemic disease. After reviewing the risks and                            benefits, the patient was deemed in satisfactory                            condition to undergo the procedure.                           After obtaining informed consent, the endoscope was                            passed under direct vision. Throughout the      procedure, the patient's blood pressure, pulse, and                            oxygen saturations were monitored continuously. The                            [id number]) scope was introduced through the                            mouth, and advanced to the efferent jejunal loop.                            The upper GI endoscopy was accomplished without                            difficulty. The patient tolerated the procedure                            well. Findings:      The Z-line was regular. Small mallory Weis tear with active oozing in       distal esophagus      Evidence of a gastric bypass was found. Multiple ~4-5 largest ~24mm       clean based ulceration noted in the gastric pouch and also the pouch to       jejunal limb. The staple line appeared intact. The gastrojejunal       anastomosis was characterized by ulceration. This was traversed. The       jejunojejunal anastomosis was characterized by healthy appearing mucosa.       The duodenum-to-jejunum limb was not examined as it could not be reached. Impression:               - Z-line regular.                           - Gastric bypass with a large-sized pouch and                            intact staple line. Multiple large clean based                            ulcers in the gastric pouch and gastro jejunal  anastomosis likely etiology of blood loss.                           - No specimens collected. Moderate Sedation:      N/A Recommendation:           - Mechanical soft diet today.                           - Continue present medications. PPI twice daily                           - No ibuprofen, naproxen, or other non-steroidal                            anti-inflammatory drugs.                           - Use Protonix (pantoprazole) 40 mg PO BID.                           - Use sucralfate suspension 1 gram PO QID for 1                            month. Procedure Code(s):        ---  Professional ---                           [phone], Esophagogastroduodenoscopy, flexible,                            transoral; diagnostic, including collection of                            specimen(s) by brushing or washing, when performed                            (separate procedure) Diagnosis Code(s):        --- Professional ---                           K28.9, Gastrojejunal ulcer, unspecified as acute or                            chronic, without hemorrhage or perforation                           K92.2, Gastrointestinal hemorrhage, unspecified CPT copyright 2016 [employer]. All rights reserved. The codes documented in this report are preliminary and upon coder review may  be revised to meet current compliance requirements. Mauri Pole, MD 01/14/2016 12:22:45 PM This report has been signed electronically. Number of Addenda: 0

## 2016-01-14 NOTE — H&P (Addendum)
[name] TMA:[id number] DOB: 09/19/1970 DOA: 01/14/2016     PCP: [name], MD   Outpatient Specialists: Rheumatology [facility] Patient coming from:  home Lives   With family    Chief Complaint: Bright red blood per rectum  HPI: [name] is a 45 y.o. female with medical history significant of polyarteritis nodosa,  psoriasis  Fibromyalgia 1 anxiety, sleep apnea, HTN, HLD, DJD, morbid obesity, lymphedema or venous insufficiency, hx of recurrent cellulitis of lower extremity  Presented with bloody bowel movements X 1 day with water in a toilet turning red  Total of 3 episodes. NO melena, She has hx of GERD and run out of her Protonix. No hx of PUD. Never had colonoscopy. Stool was loose and when flushed had red red tinge to it.  Not associated with abdominal pain. Patient never had a colonoscopy. She is in dayly aspirin. Denies epigastric pain no fever or chills no nausea vomiting  Denies lightheadedness no chest pain no shortness of breath, no Nausea vomiting, she have had some back pain.  Regarding pertinent Chronic problems: Patient has polyarteritis nodosa, followed by rheumatology and treated with steroids and CellCept He has morbid obesity complicated by venous insufficiency sleep apnea and recurrent lower extremity cellulitis secondary to lymphedema  IN ER: patient presented to [facility] ER She was found to be afebrile, HR 104 BP 112/76 98% on RA  On arrival to ER Hg 7.3 down from baseline of 11 recently WBC 12.2 INR 1.0 Na 141 Cr 0.7 BUN 17 lipase 32 She was given 1L NS    ER provider discussed case with:  GI  Hospitalist was called for admission for bright red blood per rectum symptomatic anemia  Review of Systems:    Pertinent positives include:  blood in stool, lightheadedness  Constitutional:  No weight loss, night sweats, Fevers, chills, fatigue, weight loss  HEENT:  No headaches, Difficulty swallowing,Tooth/dental problems,Sore  throat,  No sneezing, itching, ear ache, nasal congestion, post nasal drip,  Cardio-vascular:  No chest pain, Orthopnea, PND, anasarca, dizziness, palpitations.no Bilateral lower extremity swelling  GI:  No heartburn, indigestion, abdominal pain, nausea, vomiting, diarrhea, change in bowel habits, loss of appetite, melena,hematemesis Resp:  no shortness of breath at rest. No dyspnea on exertion, No excess mucus, no productive cough, No non-productive cough, No coughing up of blood.No change in color of mucus.No wheezing. Skin:  no rash or lesions. No jaundice GU:  no dysuria, change in color of urine, no urgency or frequency. No straining to urinate.  No flank pain.  Musculoskeletal:  No joint pain or no joint swelling. No decreased range of motion. No back pain.  Psych:  No change in mood or affect. No depression or anxiety. No memory loss.  Neuro: no localizing neurological complaints, no tingling, no weakness, no double vision, no gait abnormality, no slurred speech, no confusion  As per HPI otherwise 10 point review of systems negative.   Past Medical History: Past Medical History:  Diagnosis Date  . Allergic rhinitis   . Ankle fracture, right   . Anxiety   . Carpal tunnel syndrome   . Cervical cancer ([place]) 1998  . Fibromyalgia   . Hyperlipidemia   . Hypertension   . Migraine headache   . Obesity   . Sleep apnea    Past Surgical History:  Procedure Laterality Date  . ABDOMINAL HYSTERECTOMY  10/1996  . ABDOMINAL SURGERY  jan/11/2012   gastric bypass surgery  .  CARPAL TUNNEL RELEASE     bilateral   . CESAREAN SECTION    . CHOLECYSTECTOMY  07/1992     Social History:  Ambulatory independently     reports that she has never smoked. She has never used smokeless tobacco. She reports that she does not drink alcohol or use drugs.  Allergies:   Allergies  Allergen Reactions  . Aspirin     REACTION: Hives  . Bactrim Hives  . Cephalexin Hives  .  Clindamycin/Lincomycin Hives  . Doxycycline     REACTION: Hives  . Lisinopril Hives  . Naproxen     REACTION: Hives  . Niacin     REACTION: Swelling, problems breathing  . Sulfamethoxazole-Trimethoprim     REACTION: Rash, throat closed, eyes swelled.  . Benzoin Compound Rash  . Iohexol Hives    Pt treated with PO benedryl  . Pnu-Imune [Pneumococcal Polysaccharide Vaccine] Rash  . Sulfonamide Derivatives Rash       Family History:   Family History  Problem Relation Age of Onset  . Diabetes Mother   . Emphysema Mother   . Allergies Mother   . Hypertension Mother   . Heart disease Father   . Allergies Father   . Prostate cancer Father   . Breast cancer Maternal Grandmother     Medications: Prior to Admission medications   Medication Sig Start Date End Date Taking? Authorizing Provider  ALPRAZolam (XANAX) 0.5 MG tablet TAKE 1 TABLET BY MOUTH 3 TIMES DAILY AS NEEDED FOR ANXIETY OR SLEEP 12/08/15   [name], MD  ARIPiprazole (ABILIFY) 10 MG tablet Take 10 mg by mouth daily.    Historical Provider, MD  Armodafinil 250 MG tablet  11/20/15   Historical Provider, MD  baclofen (LIORESAL) 20 MG tablet Take 20 mg by mouth. 11/11/15 11/10/16  Historical Provider, MD  BIOTIN PO Take 1 tablet by mouth daily.    Historical Provider, MD  calcium citrate (CALCITRATE - DOSED IN MG ELEMENTAL CALCIUM) 950 MG tablet Take 1 tablet by mouth daily.    Historical Provider, MD  clonazePAM (KLONOPIN) 1 MG tablet TAKE 1 TABLET BY MOUTH AT BEDTIME AS NEEDED FOR RESTLESS LEG SYNDROME 10/13/15   [name], MD  Cyanocobalamin 1000 MCG/ML KIT Inject 1 mL as directed every 30 (thirty) days. 1 injection every 30 days 12/02/13   [name], MD  cyclobenzaprine (FLEXERIL) 10 MG tablet Take 1 tablet (10 mg total) by mouth 3 (three) times daily as needed. 01/08/16   [name], MD  DULoxetine (CYMBALTA) 60 MG capsule Take 2 capsules (120 mg total) by mouth daily. 06/22/15   [name], MD  furosemide (LASIX) 40 MG tablet Take 1.5 tablets (60 mg total) by mouth daily. 01/08/16   [name], MD  gabapentin (NEURONTIN) 600 MG tablet Take 1 tablet (600 mg total) by mouth 3 (three) times daily. 11/27/13   [name], MD  hydroxychloroquine (PLAQUENIL) 200 MG tablet Take 200 mg by mouth 2 (two) times daily.    Historical Provider, MD  lamoTRIgine (LAMICTAL) 200 MG tablet  11/20/15   Historical Provider, MD  mycophenolate (CELLCEPT) 500 MG tablet Take 1,500 mg by mouth 2 (two) times daily.    Historical Provider, MD  nebivolol (BYSTOLIC) 10 MG tablet Take 1 tablet (10 mg total) by mouth daily. 11/24/15   [name], MD  Needles & Syringes MISC Use one 56m syringe and 25 G 1" needle each time B12 is  injected. B12 needed once a month 01/08/14   [name], MD  omeprazole (PRILOSEC) 40 MG capsule TAKE 1 CAPSULE BY MOUTH 2 TIMES DAILY 12/10/15   [name], MD  oxyCODONE-acetaminophen (PERCOCET) 7.5-325 MG tablet Take 1 tablet by mouth every 8 (eight) hours as needed for severe pain. 12/22/15   [name], MD  predniSONE (DELTASONE) 10 MG tablet TAKE ONE TABLET BY MOUTH EVERY DAY WITH FOOD 08/06/14   [name], MD  pregabalin (LYRICA) 100 MG capsule Take 100 mg by mouth 2 (two) times daily. Reported on 06/03/2015    Historical Provider, MD  promethazine (PHENERGAN) 25 MG tablet TAKE 1 TABLET BY MOUTH EVERY 6 HOURS AS NEEDED FOR NAUSEA AND VOMITING 10/29/15   [name], MD  rosuvastatin (CRESTOR) 20 MG tablet TAKE 1 TABLET BY MOUTH EVERY DAY 12/10/15   [name], MD  tiZANidine (ZANAFLEX) 4 MG tablet Take 1 tablet (4 mg total) by mouth every 6 (six) hours as needed for muscle spasms. 06/12/15   [name], MD    Physical Exam: Patient Vitals for the past 24 hrs:  BP Resp SpO2 Height  01/14/16 0028 (!) 142/71 (!) 23 100 % '5\' 2"'  (1.575 m)    1. General:  in No Acute distress 2. Psychological: Alert and    Oriented 3. Head/ENT:   Moist   Mucous Membranes                          Head Non traumatic, neck supple                          Normal  Dentition 4. SKIN:  decreased Skin turgor, Pale Skin clean Dry and mild redness bilaterally with areas of increased redness and pustulous vesicle per patient this is chronic due to her  polyarteritis nodosa 5. Heart: Regular rate and rhythm no  Murmur, Rub or gallop 6. Lungs:  Clear to auscultation bilaterally, no wheezes or crackles   7. Abdomen: Soft,  non-tender, Non distended, obese 8. Lower extremities: no clubbing, cyanosis, or edema 9. Neurologically Grossly intact, moving all 4 extremities equally  10. MSK: Normal range of motion Her outside facility Hemoccult-positive with gross bright red blood per rectum  body mass index is 67.14 kg/m.  Labs on Admission:   Labs on Admission: I have personally reviewed following labs and imaging studies  CBC:  Recent Labs Lab 01/14/16 0124  WBC 10.1  HGB 6.5*  HCT 22.5*  MCV 89.6  PLT 305   UA  not ordered  Lab Results  Component Value Date   HGBA1C 6.1 11/25/2015    CrCl cannot be calculated (Unknown ideal weight.).  BNP (last 3 results) No results for input(s): PROBNP in the last 8760 hours.   ECG REPORT Not obtained  Filed Weights   01/14/16 0028  Weight: (!) 166.5 kg (367 lb 1.6 oz)     Cultures: No results found for: SDES, SPECREQUEST, CULT, REPTSTATUS   Radiological Exams on Admission: No results found.  Chart has been reviewed    Assessment/Plan   45 y.o. female with medical history significant of polyarteritis nodosa,  psoriasis  Fibromyalgia 1 anxiety, sleep apnea, HTN, HLD, DJD, morbid obesity, lymphedema or venous insufficiency, hx of recurrent cellulitis of lower extremity being admitted with current bloody stools and symptomatic anemia due to acute blood loss  Present on Admission: . BRBPR (  bright red blood per rectum) associated with loose stools but no  melena Appreciate GI consult will monitor and step down given  Significant drop in HG GI bleeding serial CBC keep nothing by mouth for possible colonoscopy and/or endoscopy in the morning. Currently appears to stop bleeding If significant GI bleeding resumes may benefit from nuclear medicine bleeding scan . Essential hypertension - able monitor carefully hold off all medications for now . Morbid obesity ([place]) Chronic pateint status post gastric bypass regained after diagnosis of cutaneous polyarteritis nodosa secondary to use of steroids body mass index is 67.14 kg/m. Marland Kitchen [name] (obstructive sleep apnea) - does not use CPAP . PAN (polyarteritis nodosa) ([facility]) - regarding Cutaneous polyarteritis nodosa  rheumatology has been tapering down her prednisone,   continue cellcept 1500 mg BID, HCQ 200 mg BID, given long-standing steroid use will avoid sudden discontinuation. Upper GI bleed less likely defer to GI for now continue home dose is able to tolerate by mouth  . Acute blood loss anemia obtain type and screen - transfuse 1 unit given significant hg drop currently bloody bowel movements have stopped will continue to monitor transfuse more as needed hemodynamically patient stable, monitor in step down   Other plan as per orders.  DVT prophylaxis:  SCD   Code Status:  FULL CODE  care as per patient     Family Communication:   Family  at  Bedside  plan of care was discussed with father Geriann Lafont [phone]  Disposition Plan:     To home once workup is complete and patient is stable                            Consults called: GI is aware Dr. Loletha Carrow will see Patient in AM  Admission status:  inpatient       Level of care    SDU      I have spent a total of 56 min on this admission   Kenston Longton 01/14/2016, 1:57 AM    [facility]  Pager [phone]   after 2 AM please page floor coverage PA If 7AM-7PM, please contact the day team taking care of the patient  [email or web address]    Password [email or web address]

## 2016-01-14 NOTE — Progress Notes (Signed)
CRITICAL VALUE ALERT  Critical value received:  hgb 6.5  Date of notification:  01/14/2016  Time of notification:  0139  Critical value read back: yes  Nurse who received alert:  Eulis Foster, RN  MD notified (1st page):  [facility]

## 2016-01-14 NOTE — Progress Notes (Signed)
Pt arrived to [place] at [place].  Pt is al/ox4, VS's WNL, afebrile, no complaints of pain, and sitting in the chair per pr request.  Husband is at the bedside and admitting was paged. RN will continue to monitor.

## 2016-01-14 NOTE — Assessment & Plan Note (Signed)
Pt's PE unchanged from previous.  + morbid obesity.  LE changes due to PAN.  Due for mammo- order entered.  No need for pap due to hysterectomy.  Written screening schedule updated and given to pt.  Check labs.  Anticipatory guidance provided.

## 2016-01-14 NOTE — Assessment & Plan Note (Signed)
Deteriorated.  Will increase Bystolic to 10mg  daily and monitor for improvement.  Stressed need for healthy diet and activity as able.  Will follow closely.

## 2016-01-14 NOTE — Assessment & Plan Note (Signed)
Chronic problem.  Following w/ [facility].  Attempting to wean off prednisone.  Will follow along and assist as able.

## 2016-01-14 NOTE — Assessment & Plan Note (Signed)
Chronic problem.  Tolerating Crestor w/o difficulty.  Stressed need for healthy diet and regular exercise.  Check labs.  Adjust meds prn

## 2016-01-14 NOTE — Progress Notes (Signed)
Pt says she has never had a known blood transfusion but is sensitive to many things. Text page to DR Coralyn Pear to make aware. Pt did have Benadryl IV  prior to 1st unit here last night.

## 2016-01-14 NOTE — Assessment & Plan Note (Signed)
Ongoing issue for pt.  She has regained all the weight that she lost after gastric bypass surgery.  Stressed need for healthy diet and regular exercise.  Check labs to risk stratify.  Will follow.

## 2016-01-14 NOTE — Consult Note (Signed)
[facility] Consult: 8:26 AM 01/14/2016  LOS: 0 days    Referring Provider: Dr Coralyn Pear  Primary Care Physician:  [name], MD Primary Gastroenterologist:  none    Reason for Consultation:  Maroon stool, anemia.    HPI: [name] is a 45 y.o. female.  Polyarteritis nodosa, on prednisone and Cellcept. . Obese (BMI 67, 166 kg/367#) pt with [name] (not compliant at all with bipap), anxiety.  Fibromyalgia.  HTN, HLD. S/p laparoscopic Rous-en-Y gastric bypass 2014 at [facility].  Cellulitis LE, hx LE vasculitis and chronic swelling.  Chronic MS pain.   13 different meds on allergy list, including many abx. DDD. Cervical cancer in 1998, s/p hysterectomy.  Hepatic steatosis per CT 2013. S/p cholecystectomy.   No previous colonoscopy or EGD  Normally takes Omeprazole 40 mg BID for GERD.  Ran out of Rx 2 weeks ago.  Only resumed it yesterday.  Some increase in heartburn and anorexia occurring in interim.  No nausea. No CP, no DOE.  No palpitations.  + weakness but no dizziness. Developed dark bloody,loose, maroon stool on PM 8/28, 2 more episodes on 8/29.  1 small less red stool last night, none since.  No dizziness, no palpitations, no chest pain.   In AM 8/28 stool was normal brown.    Hgb 6.5, was 10.8 on 11/24/15.  After 1 PRBC Hgb is 7.2.  MCV normal. BUN is not elevated.  IV BID protonix initiated.  Steroids currently at 15 mg daily, on very slow taper with goal to stop these in a few months.  Uses aleve rarely (less than once per month)  No ASA.  No ETOH.  No Tobacco.    Past Medical History:  Diagnosis Date  . Allergic rhinitis   . Ankle fracture, right   . Anxiety   . Carpal tunnel syndrome   . Cervical cancer ([place]) 1998  . Fibromyalgia   . Hyperlipidemia   . Hypertension   . Migraine headache   .  Obesity   . Sleep apnea     Past Surgical History:  Procedure Laterality Date  . ABDOMINAL HYSTERECTOMY  10/1996  . ABDOMINAL SURGERY  jan/11/2012   gastric bypass surgery  . CARPAL TUNNEL RELEASE     bilateral   . CESAREAN SECTION    . CHOLECYSTECTOMY  07/1992    Prior to Admission medications   Medication Sig Start Date End Date Taking? Authorizing Provider  ALPRAZolam (XANAX) 0.5 MG tablet TAKE 1 TABLET BY MOUTH 3 TIMES DAILY AS NEEDED FOR ANXIETY OR SLEEP 12/08/15   [name], MD  ARIPiprazole (ABILIFY) 10 MG tablet Take 10 mg by mouth daily.    Historical Provider, MD  Armodafinil 250 MG tablet Take 250 mg by mouth 2 (two) times daily.  11/20/15   Historical Provider, MD  BIOTIN PO Take 1 tablet by mouth daily.    Historical Provider, MD  calcium citrate (CALCITRATE - DOSED IN MG ELEMENTAL CALCIUM) 950 MG tablet Take 1 tablet by mouth daily.    Historical Provider, MD  clonazePAM (KLONOPIN) 1 MG tablet TAKE 1 TABLET BY MOUTH AT BEDTIME AS NEEDED FOR RESTLESS LEG SYNDROME 10/13/15   [name], MD  cyclobenzaprine (FLEXERIL) 10 MG tablet Take 1 tablet (10 mg total) by mouth 3 (three) times daily as needed. 01/08/16   [name], MD  DULoxetine (CYMBALTA) 60 MG capsule Take 2 capsules (120 mg total) by mouth daily. 06/22/15   [name], MD  furosemide (LASIX) 40 MG tablet Take 1.5 tablets (60 mg total) by mouth daily. Patient taking differently: Take 80 mg by mouth daily.  01/08/16   [name], MD  hydroxychloroquine (PLAQUENIL) 200 MG tablet Take 200 mg by mouth 2 (two) times daily.    Historical Provider, MD  lamoTRIgine (LAMICTAL) 200 MG tablet Take 200 mg by mouth daily.  11/20/15   Historical Provider, MD  mycophenolate (CELLCEPT) 500 MG tablet Take 1,500 mg by mouth 2 (two) times daily.    Historical Provider, MD  nebivolol (BYSTOLIC) 10 MG tablet Take 1 tablet (10 mg total) by mouth daily. 11/24/15   [name], MD  Needles & Syringes MISC  Use one 50mL syringe and 25 G 1" needle each time B12 is injected. B12 needed once a month 01/08/14   [name], MD  omeprazole (PRILOSEC) 40 MG capsule TAKE 1 CAPSULE BY MOUTH 2 TIMES DAILY 12/10/15   [name], MD  oxyCODONE-acetaminophen (PERCOCET) 7.5-325 MG tablet Take 1 tablet by mouth every 8 (eight) hours as needed for severe pain. 12/22/15   [name], MD  predniSONE (DELTASONE) 10 MG tablet TAKE ONE TABLET BY MOUTH EVERY DAY WITH FOOD 08/06/14   [name], MD  promethazine (PHENERGAN) 25 MG tablet TAKE 1 TABLET BY MOUTH EVERY 6 HOURS AS NEEDED FOR NAUSEA AND VOMITING 10/29/15   [name], MD  rosuvastatin (CRESTOR) 20 MG tablet TAKE 1 TABLET BY MOUTH EVERY DAY 12/10/15   [name], MD    Scheduled Meds: . sodium chloride   Intravenous Once  . ARIPiprazole  10 mg Oral Daily  . Chlorhexidine Gluconate Cloth  6 each Topical Q0600  . DULoxetine  120 mg Oral Daily  . gabapentin  600 mg Oral TID  . hydroxychloroquine  200 mg Oral BID  . lamoTRIgine  200 mg Oral Daily  . mupirocin ointment  1 application Nasal BID  . mycophenolate  1,500 mg Oral BID  . pantoprazole (PROTONIX) IV  40 mg Intravenous Q12H  . predniSONE  15 mg Oral Q breakfast  . rosuvastatin  20 mg Oral Daily  . sodium chloride flush  3 mL Intravenous Q12H   Infusions: . sodium chloride Stopped (01/14/16 0249)   PRN Meds: acetaminophen **OR** acetaminophen, ALPRAZolam, clonazePAM, methocarbamol, ondansetron **OR** ondansetron (ZOFRAN) IV, oxyCODONE-acetaminophen   Allergies as of 01/13/2016 - Review Complete 01/06/2016  Allergen Reaction Noted  . Aspirin    . Bactrim Hives   . Cephalexin Hives 08/13/2008  . Clindamycin/lincomycin Hives   . Doxycycline    . Lisinopril Hives 12/23/2008  . Naproxen    . Niacin    . Sulfamethoxazole-trimethoprim    . Benzoin compound Rash 03/25/2009  . Iohexol Hives 02/26/2013  . Pnu-imune [pneumococcal polysaccharide vaccine] Rash  03/21/2011  . Sulfonamide derivatives Rash 09/19/2008    Family History  Problem Relation Age of Onset  . Diabetes Mother   . Emphysema Mother   . Allergies Mother   . Hypertension Mother   . Heart disease Father   .  Allergies Father   . Prostate cancer Father   . Breast cancer Maternal Grandmother     Social History   Social History  . Marital status: Married    Spouse name: N/A  . Number of children: N/A  . Years of education: N/A   Occupational History  . disabled. prev worked as a Engineer, manufacturing systems History Main Topics  . Smoking status: Never Smoker  . Smokeless tobacco: Never Used  . Alcohol use No  . Drug use: No  . Sexual activity: Yes     Comment: TAH   Other Topics Concern  . Not on file   Social History Narrative  . No narrative on file    REVIEW OF SYSTEMS: Constitutional:  Weight nadir in 280s after bypass but with steroids, weight crept back up.   ENT:  No nose bleeds Pulm:  No new SOB.  No cough.  No significant DOE CV:  No palpitations, no LE edema.  GU:  No hematuria, no frequency GI:  Per HPI.  No dysphagia Heme:  No unusual bleeding, or bruising   Transfusions:  none Neuro: chronic pain, all over but especially in legs.  Percocet controls the pain.  Derm:  Chronic LE edema, redness, weeping.   Endocrine:  No sweats or chills.  No polyuria or dysuria Immunization:  Not queried Travel:  None beyond local counties in last few months.    PHYSICAL EXAM: Vital signs in last 24 hours: Vitals:   01/14/16 0800 01/14/16 0814  BP: (!) 162/86 (!) 162/86  Pulse:    Resp: 14 15  Temp:  98.2 F (36.8 C)   Wt Readings from Last 3 Encounters:  01/14/16 (!) 166.5 kg (367 lb 1.6 oz)  12/28/15 (!) 167.9 kg (370 lb 2 oz)  11/24/15 (!) 170.6 kg (376 lb)    General: pleasant, obese but not ill looking.  Cushingoid. Head:  No asymmetry.  + cushingoid faces  Eyes:  No icterus or pallor Ears:  Not HOH  Nose:  No discharge Mouth:  Clear,  moist.  + dental accretions/plaque.  Neck:  Obese.  No JVD or masses or TMG appreciated Lungs:  Diminished BS but normal resp effort.  No cough Heart: distant sounds, RRR.  No MRG Abdomen:  Obese, soft, NT, ND.  BS hypoactive.   Rectal: deferred   Musc/Skeltl: no joint redness or gross deformities.   Extremities:  + LE edema bil.  Redness on right with healing sores.  Not tender to touch  Neurologic:  Oriented x 3.  No tremor.  Moves all 4s, strength not tested.   Skin:  No telangectasia.  No rash.  LE as above Tattoos:  none Psych:  Pleasant, calm.  Cooperative.   Intake/Output from previous day: 08/30 0701 - 08/31 0700 In: 508.8 [I.V.:173.8; Blood:335] Out: -  Intake/Output this shift: No intake/output data recorded.  LAB RESULTS:  Recent Labs  01/14/16 0124 01/14/16 0737  WBC 10.1 9.2  HGB 6.5* 7.2*  7.1*  HCT 22.5* 24.5*  24.8*  PLT 305 299   BMET Lab Results  Component Value Date   NA 141 11/24/2015   NA 139 06/03/2015   NA 137 02/25/2014   K 4.0 11/24/2015   K 3.7 06/03/2015   K 3.9 02/25/2014   CL 99 11/24/2015   CL 99 06/03/2015   CL 103 02/25/2014   CO2 29 11/24/2015   CO2 29 06/03/2015   CO2 26 02/25/2014   GLUCOSE 127 (H)  11/24/2015   GLUCOSE 101 (H) 06/03/2015   GLUCOSE 80 02/25/2014   BUN 10 11/24/2015   BUN 9 06/03/2015   BUN 7 02/25/2014   CREATININE 0.87 11/24/2015   CREATININE 0.93 06/03/2015   CREATININE 0.9 02/25/2014   CALCIUM 9.6 11/24/2015   CALCIUM 9.2 06/03/2015   CALCIUM 9.3 02/25/2014   LFT No results for input(s): PROT, ALBUMIN, AST, ALT, ALKPHOS, BILITOT, BILIDIR, IBILI in the last 72 hours. PT/INR No results found for: INR, PROTIME Hepatitis Panel No results for input(s): HEPBSAG, HCVAB, HEPAIGM, HEPBIGM in the last 72 hours. C-Diff No components found for: CDIFF Lipase     Component Value Date/Time   LIPASE 19.0 08/13/2008 1212    Drugs of Abuse  No results found for: LABOPIA, COCAINSCRNUR, LABBENZ, AMPHETMU,  THCU, LABBARB   RADIOLOGY STUDIES: No results found.  ENDOSCOPIC STUDIES: None ever  IMPRESSION:   *  GI bleed.  Suspect upper GI tract ulcer.  ? PUD, as she ran out of PPI 2 weeks ago vs anastomotic ulcer.  BID, IV Protonix in place  *  Blood loss anemia.  Good response to 1 PRBC  *  Chronic steroid (slowly tapering) and cellcept (for 2 months) for Polyarteritis Nodosa.  *  Morbid obesity, [name].  Not compliant with bipap.  S/p 2014 lap roux en y.    *   Hepatic steatosis per 2013 CT.  LFTs normal. 11/2014 viral hepatitis serologies were negative.     PLAN:     *  EGD with MAC sedation today.  D/w pt and family members.  Pt is NPO   Azucena Freed  01/14/2016, 8:26 AM Pager: [phone]

## 2016-01-14 NOTE — Progress Notes (Signed)
PROGRESS NOTE    [name]  [id number] DOB: 1971/05/09 DOA: 01/14/2016 PCP: [name], MD   Brief Narrative:  [name] is a 45 year old female with a past medical history of polyarteritis nodosum, psoriasis, fibromyalgia, morbid obesity, who had been on prednisone therapy, presented as a transfer from outside hospital for further evaluation of bloody stools. She reported yesterday having several episodes of maroon colored stools, developing generalized weakness and poor tolerance to physical exertion. She had also reported being off of for omeprazole. Overnight labs revealed a hemoglobin of 6.5. Looking at previous lab work from 11/24/2015 she had a hemoglobin of 10.8 at the time. Being on chronic prednisone therapy, there was concern for an upper GI bleed. She was started on IV Protonix 40 mg twice daily.   Assessment & Plan:   Active Problems:   Morbid obesity ([place])   Essential hypertension   [name] (obstructive sleep apnea)   PAN (polyarteritis nodosa) (HCC)   BRBPR (bright red blood per rectum)   Acute blood loss anemia   Bright red blood per rectum  1.  Suspected upper GI bleed -[name] is a 45 year old female on chronic prednisone therapy for rheumatologic condition, presented after having several episodes of maroon colored stools at home. Lab work revealed hemoglobin of 6.5.  -Chronic steroids recently concern for upper GI bleed. -She was started on Protonix 40 mg IV twice daily -Continue blood transfusions, I have discussed case with Erica Broad PA from [place] who will consult. -Will keep her nothing by mouth and provide maintenance IV fluids  2.  Acute blood loss anemia -Lab work showing hemoglobin of 6.5, reporting having last bloody bowel movement yesterday evening -She has been typed and crossed and has received 1 unit of packed blood cells 4, plan to transfuse a second unit of blood this morning -GI has been consulted for bleed  3.  History of  polyarteritis nodosa -She currently receives her care at [facility] rheumatology, status post biopsy in 2016. -Continue CellCept 1500 mg twice a day and prednisone 15 mg by mouth daily  4.  Chronic back pain -Continue Neurontin 600 mg by mouth 3 times a day, adding Robaxin 500 mg every 6 hours as needed for muscle spasms  DVT prophylaxis: SCD's  Code Status: Full Code Family Communication: I spoke to her husband who was present at bedside Disposition Plan: GI Consult  Consultants:   [facility]  Procedures:   Subjective: Sitting at bedside chair, states not having further GI bleeds since hospitalization, feels better after receiving blood transfusion  Objective: Vitals:   01/14/16 0600 01/14/16 0700 01/14/16 0800 01/14/16 0814  BP: (!) 151/92 (!) 168/84 (!) 162/86 (!) 162/86  Pulse:      Resp: (!) 21 (!) 25 14 15   Temp:    98.2 F (36.8 C)  TempSrc:    Oral  SpO2:    98%  Weight:      Height:        Intake/Output Summary (Last 24 hours) at 01/14/16 0843 Last data filed at 01/14/16 0521  Gross per 24 hour  Intake           508.75 ml  Output                0 ml  Net           508.75 ml   Filed Weights   01/14/16 0028  Weight: (!) 166.5 kg (367 lb 1.6 oz)    Examination:  General exam: Pale appearing, no acute distress sitting at bedside chair Respiratory system: Clear to auscultation. Respiratory effort normal. Cardiovascular system: S1 & S2 heard, RRR. No JVD, murmurs, rubs, gallops or clicks. No pedal edema. Gastrointestinal system: Abdomen is nondistended, soft and nontender. No organomegaly or masses felt. Normal bowel sounds heard. Central nervous system: Alert and oriented. No focal neurological deficits. Extremities: Symmetric 5 x 5 power. Skin: No rashes, lesions or ulcers Psychiatry: Judgement and insight appear normal. Mood & affect appropriate.     Data Reviewed: I have personally reviewed following labs and imaging studies  CBC:  Recent Labs Lab  01/14/16 0124 01/14/16 0737  WBC 10.1 9.2  HGB 6.5* 7.2*  7.1*  HCT 22.5* 24.5*  24.8*  MCV 89.6 89.2  PLT 305 [id number]   Basic Metabolic Panel: No results for input(s): NA, K, CL, CO2, GLUCOSE, BUN, CREATININE, CALCIUM, MG, PHOS in the last 168 hours. GFR: CrCl cannot be calculated (Patient's most recent lab result is older than the maximum 21 days allowed.). Liver Function Tests: No results for input(s): AST, ALT, ALKPHOS, BILITOT, PROT, ALBUMIN in the last 168 hours. No results for input(s): LIPASE, AMYLASE in the last 168 hours. No results for input(s): AMMONIA in the last 168 hours. Coagulation Profile: No results for input(s): INR, PROTIME in the last 168 hours. Cardiac Enzymes: No results for input(s): CKTOTAL, CKMB, CKMBINDEX, TROPONINI in the last 168 hours. BNP (last 3 results) No results for input(s): PROBNP in the last 8760 hours. HbA1C: No results for input(s): HGBA1C in the last 72 hours. CBG: No results for input(s): GLUCAP in the last 168 hours. Lipid Profile: No results for input(s): CHOL, HDL, LDLCALC, TRIG, CHOLHDL, LDLDIRECT in the last 72 hours. Thyroid Function Tests: No results for input(s): TSH, T4TOTAL, FREET4, T3FREE, THYROIDAB in the last 72 hours. Anemia Panel: No results for input(s): VITAMINB12, FOLATE, FERRITIN, TIBC, IRON, RETICCTPCT in the last 72 hours. Sepsis Labs: No results for input(s): PROCALCITON, LATICACIDVEN in the last 168 hours.  Recent Results (from the past 240 hour(s))  MRSA PCR Screening     Status: Abnormal   Collection Time: 01/14/16 12:33 AM  Result Value Ref Range Status   MRSA by PCR POSITIVE (A) NEGATIVE Final    Comment: RESULT CALLED TO, READ BACK BY AND VERIFIED WITH: KELSEY,RN ON 2C @0359  01/14/16 MKELLY        The GeneXpert MRSA Assay (FDA approved for NASAL specimens only), is one component of a comprehensive MRSA colonization surveillance program. It is not intended to diagnose MRSA infection nor to guide  or monitor treatment for MRSA infections.          Radiology Studies: No results found.      Scheduled Meds: . sodium chloride   Intravenous Once  . ARIPiprazole  10 mg Oral Daily  . Chlorhexidine Gluconate Cloth  6 each Topical Q0600  . DULoxetine  120 mg Oral Daily  . gabapentin  600 mg Oral TID  . hydroxychloroquine  200 mg Oral BID  . lamoTRIgine  200 mg Oral Daily  . mupirocin ointment  1 application Nasal BID  . mycophenolate  1,500 mg Oral BID  . pantoprazole (PROTONIX) IV  40 mg Intravenous Q12H  . predniSONE  15 mg Oral Q breakfast  . rosuvastatin  20 mg Oral Daily  . sodium chloride flush  3 mL Intravenous Q12H   Continuous Infusions: . sodium chloride Stopped (01/14/16 0249)     LOS: 0 days  Time spent:     Kelvin Cellar, MD [facility] Pager [phone]  If 7PM-7AM, please contact night-coverage [email or web address] Password [facility] 01/14/2016, 8:43 AM

## 2016-01-14 NOTE — Progress Notes (Signed)
Pt has a Tele bed on [address] bed 34. Called report to [employer]. Call made to Endo to have pt transferred to [address] bed 34 when her procedure is done. Belongings packed up taken to new  room by NT.

## 2016-01-14 NOTE — Transfer of Care (Signed)
Immediate Anesthesia Transfer of Care Note  Patient: [name]  Procedure(s) Performed: Procedure(s): ESOPHAGOGASTRODUODENOSCOPY (EGD) (N/A)  Patient Location: Endoscopy Unit  Anesthesia Type:MAC  Level of Consciousness: awake, oriented, sedated, patient cooperative and responds to stimulation  Airway & Oxygen Therapy: Patient Spontanous Breathing and Patient connected to nasal cannula oxygen  Post-op Assessment: Report given to RN, Post -op Vital signs reviewed and stable, Patient moving all extremities and Patient moving all extremities X 4  Post vital signs: Reviewed and stable  Last Vitals:  Vitals:   01/14/16 1038 01/14/16 1214  BP: (!) 168/90 (!) 164/96  Pulse: 100 (!) 111  Resp: 17 (!) 23  Temp:      Last Pain:  Vitals:   01/14/16 1214  TempSrc: Oral  PainSc:       Patients Stated Pain Goal: 2 ([id number])  Complications: No apparent anesthesia complications

## 2016-01-15 DIAGNOSIS — I10 Essential (primary) hypertension: Secondary | ICD-10-CM

## 2016-01-15 DIAGNOSIS — M300 Polyarteritis nodosa: Secondary | ICD-10-CM

## 2016-01-15 DIAGNOSIS — K922 Gastrointestinal hemorrhage, unspecified: Secondary | ICD-10-CM

## 2016-01-15 LAB — CBC
HEMATOCRIT: 28 % — AB (ref 36.0–46.0)
HEMOGLOBIN: 8.2 g/dL — AB (ref 12.0–15.0)
MCH: 26.2 pg (ref 26.0–34.0)
MCHC: 29.3 g/dL — ABNORMAL LOW (ref 30.0–36.0)
MCV: 89.5 fL (ref 78.0–100.0)
Platelets: 292 10*3/uL (ref 150–400)
RBC: 3.13 MIL/uL — AB (ref 3.87–5.11)
RDW: 17.2 % — AB (ref 11.5–15.5)
WBC: 10.1 10*3/uL (ref 4.0–10.5)

## 2016-01-15 LAB — TYPE AND SCREEN
ABO/RH(D): O POS
ANTIBODY SCREEN: NEGATIVE
UNIT DIVISION: 0
UNIT DIVISION: 0

## 2016-01-15 LAB — BASIC METABOLIC PANEL
ANION GAP: 6 (ref 5–15)
BUN: 7 mg/dL (ref 6–20)
CALCIUM: 9.1 mg/dL (ref 8.9–10.3)
CO2: 27 mmol/L (ref 22–32)
Chloride: 105 mmol/L (ref 101–111)
Creatinine, Ser: 0.69 mg/dL (ref 0.44–1.00)
GFR calc non Af Amer: >60 mL/min (ref >60)
GLUCOSE: 96 mg/dL (ref 65–99)
POTASSIUM: 4 mmol/L (ref 3.5–5.1)
Sodium: 138 mmol/L (ref 135–145)

## 2016-01-15 MED ORDER — PANTOPRAZOLE SODIUM 40 MG PO TBEC: 40.0000 mg | DELAYED_RELEASE_TABLET | Freq: Every day | ORAL | 1 refills | Status: DC

## 2016-01-15 MED ORDER — SUCRALFATE 1 G PO TABS: 1.0000 g | ORAL_TABLET | Freq: Three times a day (TID) | ORAL | 0 refills | Status: DC

## 2016-01-15 MED ORDER — MUPIROCIN 2 % EX OINT: 1.0000 "application " | TOPICAL_OINTMENT | Freq: Two times a day (BID) | CUTANEOUS | 0 refills | Status: DC

## 2016-01-15 NOTE — Progress Notes (Signed)
Physician Discharge Summary  [name] [id number] DOB: [birth year] DOA: 01/14/2016  PCP: [name], MD  Admit date: 01/14/2016 Discharge date: 01/15/2016  Time spent: 35 minutes  Recommendations for Outpatient Follow-up:  1. Please follow up on CBC on hospital follow up visit, she was admitted for GI bleed and transfused with 2 units of PRBC's. Had Hg of 8.2 on 8.3 on day of discharge.    Discharge Diagnoses:  Active Problems:   Morbid obesity ([place])   Essential hypertension   [name] (obstructive sleep apnea)   PAN (polyarteritis nodosa) (HCC)   BRBPR (bright red blood per rectum)   Acute blood loss anemia   Bright red blood per rectum   Melena   Multiple gastric ulcers   Discharge Condition: Stable  Diet recommendation: Heart Healthy  Filed Weights   01/14/16 0028  Weight: (!) 166.5 kg (367 lb 1.6 oz)    History of present illness:  [name] is a 45 y.o. female with medical history significant of polyarteritis nodosa,  psoriasis  Fibromyalgia 1 anxiety, sleep apnea, HTN, HLD, DJD, morbid obesity, lymphedema or venous insufficiency, hx of recurrent cellulitis of lower extremity  Presented with bloody bowel movements X 1 day with water in a toilet turning red  Total of 3 episodes. NO melena, She has hx of GERD and run out of her Protonix. No hx of PUD. Never had colonoscopy. Stool was loose and when flushed had red red tinge to it.  Not associated with abdominal pain. Patient never had a colonoscopy. She is in dayly aspirin. Denies epigastric pain no fever or chills no nausea vomiting  Denies lightheadedness no chest pain no shortness of breath, no Nausea vomiting, she have had some back pain.  Regarding pertinent Chronic problems: Patient has polyarteritis nodosa, followed by rheumatology and treated with steroids and CellCept He has morbid obesity complicated by venous insufficiency sleep apnea and recurrent lower extremity cellulitis secondary to  lymphedema  Hospital Course:  [name] is a 45 year old female with a past medical history of polyarteritis nodosum, psoriasis, fibromyalgia, morbid obesity, who had been on prednisone therapy, presented as a transfer from outside hospital for further evaluation of bloody stools. She reported yesterday having several episodes of maroon colored stools, developing generalized weakness and poor tolerance to physical exertion. She had also reported being off of for omeprazole. Overnight labs revealed a hemoglobin of 6.5. Looking at previous lab work from 11/24/2015 she had a hemoglobin of 10.8 at the time. Being on chronic prednisone therapy, there was concern for an upper GI bleed. She was started on IV Protonix 40 mg twice daily.  1.  Suspected upper GI bleed -Erica Fredrick is a 45 year old female on chronic prednisone therapy for rheumatologic condition, presented after having several episodes of maroon colored stools at home. Lab work revealed hemoglobin of 6.5.  -Chronic steroids recently concern for upper GI bleed. -She was started on Protonix 40 mg IV twice daily -Continue blood transfusions, I have discussed case with [name] from [place] who will consult. -On 01/14/2016 she underwent upper endoscopy found to have multiple large clean-based ulcers in the gastric pouch and gastrojejunal anastomosis.  -GI recommended discharging on Protonix and one month of Carafate  2.  Acute blood loss anemia -Lab work showing hemoglobin of 6.5, reporting having last bloody bowel movement yesterday evening -On 01/14/2016: She has been typed and crossed and has received 1 unit of packed blood cells, plan to transfuse a second unit of blood  this morning -On 01/15/2016 patient having further episodes of GI bleed. A.m. lab work showing hemoglobin of 8.4 -Clinically looks much better with anemia symptoms resolving.  3.  History of polyarteritis nodosa -She currently receives her care at [facility] rheumatology,  status post biopsy in 2016. -Continue CellCept 1500 mg twice a day and prednisone 15 mg by mouth daily  4.  Chronic back pain -Continue Neurontin 600 mg by mouth 3 times a day  Procedures:  EGD performed on 01/14/2016 Impression:       - Z-line regular.                           - Gastric bypass with a large-sized pouch and                            intact staple line. Multiple large clean based                            ulcers in the gastric pouch and gastro jejunal                            anastomosis likely etiology of blood loss.                          - No specimens collected.   Consultations:  GI  Discharge Exam: Vitals:   01/15/16 0034 01/15/16 0519  BP: (!) 147/73 (!) 154/92  Pulse: 70 73  Resp: 19   Temp: 97.4 F (36.3 C) 98.1 F (36.7 C)    General exam: Looks much better, asking to go home Respiratory system: Clear to auscultation. Respiratory effort normal. Cardiovascular system: S1 & S2 heard, RRR. No JVD, murmurs, rubs, gallops or clicks. No pedal edema. Gastrointestinal system: Abdomen is nondistended, soft and nontender. No organomegaly or masses felt. Normal bowel sounds heard. Central nervous system: Alert and oriented. No focal neurological deficits. Extremities: Symmetric 5 x 5 power. Skin: No rashes, lesions or ulcers Psychiatry: Judgement and insight appear normal. Mood & affect appropriate.    Discharge Instructions   Discharge Instructions    Call MD for:    Complete by:  As directed   Call MD for:  difficulty breathing, headache or visual disturbances    Complete by:  As directed   Call MD for:  extreme fatigue    Complete by:  As directed   Call MD for:  hives    Complete by:  As directed   Call MD for:  persistant dizziness or light-headedness    Complete by:  As directed   Call MD for:  persistant nausea and vomiting    Complete by:  As directed   Call MD for:  redness, tenderness, or signs of infection (pain, swelling, redness,  odor or green/yellow discharge around incision site)    Complete by:  As directed   Call MD for:  severe uncontrolled pain    Complete by:  As directed   Call MD for:  temperature >100.4    Complete by:  As directed   Diet - low sodium heart healthy    Complete by:  As directed   Increase activity slowly    Complete by:  As directed     Current Discharge Medication List    START taking  these medications   Details  mupirocin ointment (BACTROBAN) 2 % Place 1 application into the nose 2 (two) times daily. Qty: 22 g, Refills: 0    pantoprazole (PROTONIX) 40 MG tablet Take 1 tablet (40 mg total) by mouth daily. Qty: 90 tablet, Refills: 1    sucralfate (CARAFATE) 1 g tablet Take 1 tablet (1 g total) by mouth 4 (four) times daily -  with meals and at bedtime. Qty: 90 tablet, Refills: 0      CONTINUE these medications which have NOT CHANGED   Details  ALPRAZolam (XANAX) 0.5 MG tablet TAKE 1 TABLET BY MOUTH 3 TIMES DAILY AS NEEDED FOR ANXIETY OR SLEEP Qty: 90 tablet, Refills: 1    ARIPiprazole (ABILIFY) 10 MG tablet Take 10 mg by mouth daily.    Armodafinil 250 MG tablet Take 250 mg by mouth 2 (two) times daily.     BIOTIN PO Take 1 tablet by mouth daily.    calcium citrate (CALCITRATE - DOSED IN MG ELEMENTAL CALCIUM) 950 MG tablet Take 1 tablet by mouth daily.    clonazePAM (KLONOPIN) 1 MG tablet TAKE 1 TABLET BY MOUTH AT BEDTIME AS NEEDED FOR RESTLESS LEG SYNDROME Qty: 30 tablet, Refills: 3    cyclobenzaprine (FLEXERIL) 10 MG tablet Take 1 tablet (10 mg total) by mouth 3 (three) times daily as needed. Qty: 270 tablet, Refills: 0    DULoxetine (CYMBALTA) 60 MG capsule Take 2 capsules (120 mg total) by mouth daily. Qty: 240 capsule, Refills: 2    furosemide (LASIX) 40 MG tablet Take 1.5 tablets (60 mg total) by mouth daily. Qty: 135 tablet, Refills: 0    gabapentin (NEURONTIN) 100 MG capsule Take 200-300 mg by mouth 3 (three) times daily as needed (for pain).     hydroxychloroquine (PLAQUENIL) 200 MG tablet Take 200 mg by mouth 2 (two) times daily.    lamoTRIgine (LAMICTAL) 200 MG tablet Take 200 mg by mouth 2 (two) times daily.     mycophenolate (CELLCEPT) 500 MG tablet Take 1,500 mg by mouth 2 (two) times daily.    nebivolol (BYSTOLIC) 10 MG tablet Take 1 tablet (10 mg total) by mouth daily. Qty: 90 tablet, Refills: 1    oxyCODONE-acetaminophen (PERCOCET) 7.5-325 MG tablet Take 1 tablet by mouth every 8 (eight) hours as needed for severe pain. Qty: 60 tablet, Refills: 0    predniSONE (DELTASONE) 10 MG tablet TAKE ONE TABLET BY MOUTH EVERY DAY WITH FOOD Qty: 30 tablet, Refills: 3    promethazine (PHENERGAN) 25 MG tablet TAKE 1 TABLET BY MOUTH EVERY 6 HOURS AS NEEDED FOR NAUSEA AND VOMITING Qty: 60 tablet, Refills: 1    rosuvastatin (CRESTOR) 20 MG tablet TAKE 1 TABLET BY MOUTH EVERY DAY Qty: 90 tablet, Refills: 0    Needles & Syringes MISC Use one 72mL syringe and 25 G 1" needle each time B12 is injected. B12 needed once a month Qty: 24 each, Refills: 0      STOP taking these medications     omeprazole (PRILOSEC) 40 MG capsule      gabapentin (NEURONTIN) 600 MG tablet        Allergies  Allergen Reactions  . Niacin Other (See Comments)    REACTION: Swelling, problems breathing  . Sulfamethoxazole-Trimethoprim Other (See Comments)    REACTION: Rash, throat closed, eyes swelled.  . Aspirin Hives  . Bactrim Hives  . Benzoin Compound Rash  . Cephalexin Hives  . Clindamycin/Lincomycin Hives  . Doxycycline Hives  . Iohexol Hives  Pt treated with PO benedryl  . Lisinopril Hives  . Naproxen Hives  . Pnu-Imune [Pneumococcal Polysaccharide Vaccine] Rash  . Sulfonamide Derivatives Rash   Follow-up Information    [name], MD Follow up in 1 week(s).   Specialty:  Family Medicine Contact information: [address] [place] [address] [phone]            The results of significant diagnostics from  this hospitalization (including imaging, microbiology, ancillary and laboratory) are listed below for reference.    Significant Diagnostic Studies: Mm Digital Screening Bilateral  Result Date: 12/29/2015 CLINICAL DATA:  Screening. EXAM: DIGITAL SCREENING BILATERAL MAMMOGRAM WITH CAD COMPARISON:  Previous exam(s). ACR Breast Density Category a: The breast tissue is almost entirely fatty. FINDINGS: In the left breast, a possible mass warrants further evaluation. In the right breast, no findings suspicious for malignancy. Images were processed with CAD. IMPRESSION: Further evaluation is suggested for possible mass in the left breast. RECOMMENDATION: Diagnostic mammogram and possibly ultrasound of the left breast. (Code:[id number]-[id number]) The patient will be contacted regarding the findings, and additional imaging will be scheduled. BI-RADS CATEGORY  0: Incomplete. Need additional imaging evaluation and/or prior mammograms for comparison. Electronically Signed   By: Lajean Manes M.D.   On: 12/29/2015 11:17   US Breast Ltd Uni Left Inc Axilla  Result Date: 01/06/2016 CLINICAL DATA:  The patient was called back from screening mammography due to a left breast mass EXAM: 2D DIGITAL DIAGNOSTIC LEFT MAMMOGRAM WITH CAD AND ADJUNCT TOMO ULTRASOUND LEFT BREAST COMPARISON:  Previous exam(s). ACR Breast Density Category b: There are scattered areas of fibroglandular density. FINDINGS: There is an intramammary lymph node in the lateral central left breast which has gotten larger in the interval. It measures approximately 8 mm today versus fibrous 6 mm on more remote studies. Mammographic images were processed with CAD. On physical exam, no suspicious abnormalities are identified. Targeted ultrasound is performed, showing an intramammary lymph node measuring up 8 mm in greatest dimension with a cortex measuring between 3 and 4 mm. IMPRESSION: There is a growing lymph node in the lateral central left breast. Upon questioning, the  patient states she was recently diagnosed with vasculitis and is on therapy including steroids. The lymph node is indeterminate but likely reactive. RECOMMENDATION: Recommend a three-month follow-up mammogram and ultrasound of the probably benign, likely reactive, lymph node. I have discussed the findings and recommendations with the patient. Results were also provided in writing at the conclusion of the visit. If applicable, a reminder letter will be sent to the patient regarding the next appointment. BI-RADS CATEGORY  3: Probably benign. Electronically Signed   By: Dorise Bullion III M.D   On: 01/06/2016 12:13   Mm Diag Breast Tomo Uni Left  Result Date: 01/06/2016 CLINICAL DATA:  The patient was called back from screening mammography due to a left breast mass EXAM: 2D DIGITAL DIAGNOSTIC LEFT MAMMOGRAM WITH CAD AND ADJUNCT TOMO ULTRASOUND LEFT BREAST COMPARISON:  Previous exam(s). ACR Breast Density Category b: There are scattered areas of fibroglandular density. FINDINGS: There is an intramammary lymph node in the lateral central left breast which has gotten larger in the interval. It measures approximately 8 mm today versus fibrous 6 mm on more remote studies. Mammographic images were processed with CAD. On physical exam, no suspicious abnormalities are identified. Targeted ultrasound is performed, showing an intramammary lymph node measuring up 8 mm in greatest dimension with a cortex measuring between 3 and 4 mm. IMPRESSION: There  is a growing lymph node in the lateral central left breast. Upon questioning, the patient states she was recently diagnosed with vasculitis and is on therapy including steroids. The lymph node is indeterminate but likely reactive. RECOMMENDATION: Recommend a three-month follow-up mammogram and ultrasound of the probably benign, likely reactive, lymph node. I have discussed the findings and recommendations with the patient. Results were also provided in writing at the conclusion  of the visit. If applicable, a reminder letter will be sent to the patient regarding the next appointment. BI-RADS CATEGORY  3: Probably benign. Electronically Signed   By: Dorise Bullion III M.D   On: 01/06/2016 12:13    Microbiology: Recent Results (from the past 240 hour(s))  MRSA PCR Screening     Status: Abnormal   Collection Time: 01/14/16 12:33 AM  Result Value Ref Range Status   MRSA by PCR POSITIVE (A) NEGATIVE Final    Comment: RESULT CALLED TO, READ BACK BY AND VERIFIED WITH: KELSEY,RN ON 2C @0359  01/14/16 MKELLY        The GeneXpert MRSA Assay (FDA approved for NASAL specimens only), is one component of a comprehensive MRSA colonization surveillance program. It is not intended to diagnose MRSA infection nor to guide or monitor treatment for MRSA infections.      Labs: Basic Metabolic Panel:  Recent Labs Lab 01/14/16 0737 01/15/16 0320  NA 139 138  K 4.3 4.0  CL 106 105  CO2 27 27  GLUCOSE 88 96  BUN 11 7  CREATININE 0.66 0.69  CALCIUM 8.6* 9.1  MG 2.3  --   PHOS 4.6  --    Liver Function Tests:  Recent Labs Lab 01/14/16 0737  AST 14*  ALT 13*  ALKPHOS 70  BILITOT 0.4  PROT 5.2*  ALBUMIN 3.1*   No results for input(s): LIPASE, AMYLASE in the last 168 hours. No results for input(s): AMMONIA in the last 168 hours. CBC:  Recent Labs Lab 01/14/16 0124 01/14/16 0737 01/14/16 2146 01/15/16 0320  WBC 10.1 9.2 11.9* 10.1  HGB 6.5* 7.2*  7.1* 8.7* 8.2*  HCT 22.5* 24.5*  24.8* 29.5* 28.0*  MCV 89.6 89.2 89.1 89.5  PLT 305 299 326 292   Cardiac Enzymes: No results for input(s): CKTOTAL, CKMB, CKMBINDEX, TROPONINI in the last 168 hours. BNP: BNP (last 3 results) No results for input(s): BNP in the last 8760 hours.  ProBNP (last 3 results) No results for input(s): PROBNP in the last 8760 hours.  CBG: No results for input(s): GLUCAP in the last 168 hours.     Signed:  Kelvin Cellar MD.  [facility] 01/15/2016, 12:36  PM

## 2016-01-15 NOTE — Progress Notes (Signed)
Daily Rounding Note  01/15/2016, 11:01 AM  LOS: 1 day   SUBJECTIVE:   Chief complaint: weakness and maroon stool   Feels well.  No dizziness or weakness, walking in room and hall.  Stool overnight was brown.  Ready to go home  OBJECTIVE:         Vital signs in last 24 hours:    Temp:  [97.4 F (36.3 C)-98.4 F (36.9 C)] 98.1 F (36.7 C) (09/01 0519) Pulse Rate:  [70-111] 73 (09/01 0519) Resp:  [14-29] 19 (09/01 0034) BP: (142-164)/(58-96) 154/92 (09/01 0519) SpO2:  [96 %-100 %] 100 % (09/01 0519) Last BM Date: 01/14/16 Filed Weights   01/14/16 0028  Weight: (!) 166.5 kg (367 lb 1.6 oz)   General: obese, pleasant, looks well.  Visual exam only.   Heart: RRR Chest: no labored breathing Abdomen: not examined  Extremities: no CCE Neuro/Psych:  Pleasant, calm.  Fully alert and oriented.   Intake/Output from previous day: 08/31 0701 - 09/01 0700 In: 602 [I.V.:300; Blood:302] Out: 925 [Urine:925]  Intake/Output this shift: No intake/output data recorded.  Lab Results:  Recent Labs  01/14/16 0737 01/14/16 2146 01/15/16 0320  WBC 9.2 11.9* 10.1  HGB 7.2*  7.1* 8.7* 8.2*  HCT 24.5*  24.8* 29.5* 28.0*  PLT 299 326 292   BMET  Recent Labs  01/14/16 0737 01/15/16 0320  NA 139 138  K 4.3 4.0  CL 106 105  CO2 27 27  GLUCOSE 88 96  BUN 11 7  CREATININE 0.66 0.69  CALCIUM 8.6* 9.1   LFT  Recent Labs  01/14/16 0737  PROT 5.2*  ALBUMIN 3.1*  AST 14*  ALT 13*  ALKPHOS 70  BILITOT 0.4   PT/INR No results for input(s): LABPROT, INR in the last 72 hours. Hepatitis Panel No results for input(s): HEPBSAG, HCVAB, HEPAIGM, HEPBIGM in the last 72 hours.  Studies/Results: No results found.  Scheduled Meds: . sodium chloride   Intravenous Once  . sodium chloride   Intravenous Once  . ARIPiprazole  10 mg Oral Daily  . Chlorhexidine Gluconate Cloth  6 each Topical Q0600  . DULoxetine  120 mg Oral  Daily  . hydroxychloroquine  200 mg Oral BID  . lamoTRIgine  200 mg Oral BID  . mupirocin ointment  1 application Nasal BID  . mycophenolate  1,500 mg Oral BID  . nebivolol  10 mg Oral Daily  . pantoprazole  40 mg Oral BID  . predniSONE  15 mg Oral Q breakfast  . rosuvastatin  20 mg Oral Daily  . sodium chloride flush  3 mL Intravenous Q12H  . sucralfate  1 g Oral TID WC & HS   Continuous Infusions: . sodium chloride     PRN Meds:.acetaminophen **OR** acetaminophen, ALPRAZolam, clonazePAM, gabapentin, methocarbamol, ondansetron **OR** ondansetron (ZOFRAN) IV, oxyCODONE-acetaminophen    ASSESMENT:   *  Upper GI bleed 8/31 EGD: clean based ulcers in gastric pouch and at gastro-j anastomosis.   *  Blood loss anemia.  S/p PRBC x 2.  Hgb overall improved.  Pt without sxs. .   *  PAN on cellcept and tapering slowly off Prednisone.   *  2014 laparoscopic Roux-en Y.      PLAN   *  BID po PPI and qid Carafate for 4 weeks, then stop Carafate but continue BID PPI.   *  Ok to discharge today.  Follow up with  Azucena Freed  01/15/2016, 11:01 AM Pager: [phone]

## 2016-01-15 NOTE — Progress Notes (Signed)
Nsg Discharge Note  Admit Date:  01/14/2016 Discharge date: 01/15/2016   [name] to be D/C'd Home per MD order.  AVS completed.  Copy for chart, and copy for patient signed, and dated. Patient/caregiver able to verbalize understanding.  Discharge Medication:   Medication List    STOP taking these medications   omeprazole 40 MG capsule Commonly known as:  PRILOSEC     TAKE these medications   ALPRAZolam 0.5 MG tablet Commonly known as:  XANAX TAKE 1 TABLET BY MOUTH 3 TIMES DAILY AS NEEDED FOR ANXIETY OR SLEEP What changed:  See the new instructions.   ARIPiprazole 10 MG tablet Commonly known as:  ABILIFY Take 10 mg by mouth daily.   Armodafinil 250 MG tablet Take 250 mg by mouth 2 (two) times daily.   BIOTIN PO Take 1 tablet by mouth daily.   calcium citrate 950 MG tablet Commonly known as:  CALCITRATE - dosed in mg elemental calcium Take 1 tablet by mouth daily.   clonazePAM 1 MG tablet Commonly known as:  KLONOPIN TAKE 1 TABLET BY MOUTH AT BEDTIME AS NEEDED FOR RESTLESS LEG SYNDROME What changed:  See the new instructions.   cyclobenzaprine 10 MG tablet Commonly known as:  FLEXERIL Take 1 tablet (10 mg total) by mouth 3 (three) times daily as needed. What changed:  reasons to take this   DULoxetine 60 MG capsule Commonly known as:  CYMBALTA Take 2 capsules (120 mg total) by mouth daily.   furosemide 40 MG tablet Commonly known as:  LASIX Take 1.5 tablets (60 mg total) by mouth daily. What changed:  how much to take   gabapentin 100 MG capsule Commonly known as:  NEURONTIN Take 200-300 mg by mouth 3 (three) times daily as needed (for pain).   hydroxychloroquine 200 MG tablet Commonly known as:  PLAQUENIL Take 200 mg by mouth 2 (two) times daily.   lamoTRIgine 200 MG tablet Commonly known as:  LAMICTAL Take 200 mg by mouth 2 (two) times daily.   mupirocin ointment 2 % Commonly known as:  BACTROBAN Place 1 application into the nose 2 (two) times  daily.   mycophenolate 500 MG tablet Commonly known as:  CELLCEPT Take 1,500 mg by mouth 2 (two) times daily.   nebivolol 10 MG tablet Commonly known as:  BYSTOLIC Take 1 tablet (10 mg total) by mouth daily.   Needles & Syringes Misc Use one 78mL syringe and 25 G 1" needle each time B12 is injected. B12 needed once a month   oxyCODONE-acetaminophen 7.5-325 MG tablet Commonly known as:  PERCOCET Take 1 tablet by mouth every 8 (eight) hours as needed for severe pain.   pantoprazole 40 MG tablet Commonly known as:  PROTONIX Take 1 tablet (40 mg total) by mouth daily.   predniSONE 10 MG tablet Commonly known as:  DELTASONE TAKE ONE TABLET BY MOUTH EVERY DAY WITH FOOD What changed:  See the new instructions.   promethazine 25 MG tablet Commonly known as:  PHENERGAN TAKE 1 TABLET BY MOUTH EVERY 6 HOURS AS NEEDED FOR NAUSEA AND VOMITING What changed:  See the new instructions.   rosuvastatin 20 MG tablet Commonly known as:  CRESTOR TAKE 1 TABLET BY MOUTH EVERY DAY What changed:  See the new instructions.   sucralfate 1 g tablet Commonly known as:  CARAFATE Take 1 tablet (1 g total) by mouth 4 (four) times daily -  with meals and at bedtime.       Discharge Assessment:  Vitals:   01/15/16 0034 01/15/16 0519  BP: (!) 147/73 (!) 154/92  Pulse: 70 73  Resp: 19   Temp: 97.4 F (36.3 C) 98.1 F (36.7 C)   Skin clean, dry and intact without evidence of skin break down, no evidence of skin tears noted. IV catheter discontinued intact. Site without signs and symptoms of complications - no redness or edema noted at insertion site, patient denies c/o pain - only slight tenderness at site.  Dressing with slight pressure applied.  D/c Instructions-Education: Discharge instructions given to patient/family with verbalized understanding. D/c education completed with patient/family including follow up instructions, medication list, d/c activities limitations if indicated, with other  d/c instructions as indicated by MD - patient able to verbalize understanding, all questions fully answered. Patient instructed to return to ED, call 911, or call MD for any changes in condition.  Patient escorted via [place], and D/C home via private auto.  Erica Slaughter, RN 01/15/2016 12:58 PM

## 2016-01-18 NOTE — Discharge Summary (Addendum)
Physician Discharge Summary  CASELYNN Macias [id number] DOB: 1970/09/21 DOA: 01/14/2016  PCP: [name], MD  Admit date: 01/14/2016 Discharge date: 01/15/2016  Time spent: 35 minutes  Recommendations for Outpatient Follow-up:  1. Please follow up on CBC on hospital follow up visit, she was admitted for GI bleed and transfused with 2 units of PRBC's. Had Hg of 8.2 on 8.3 on day of discharge.    Discharge Diagnoses:  Active Problems:   Morbid obesity ([place])   Essential hypertension   [name] (obstructive sleep apnea)   PAN (polyarteritis nodosa) (HCC)   BRBPR (bright red blood per rectum)   Acute blood loss anemia   Bright red blood per rectum   Melena   Multiple gastric ulcers  GI bleed likely secondary to gastric ulcers.   Discharge Condition: Stable  Diet recommendation: Heart Healthy     Filed Weights   01/14/16 0028  Weight: (!) 166.5 kg (367 lb 1.6 oz)    History of present illness:  [name] a 45 y.o.femalewith medical history significant of polyarteritis nodosa, psoriasis Fibromyalgia 1 anxiety, sleep apnea, HTN, HLD, DJD, morbid obesity, lymphedema or venous insufficiency, hxof recurrent cellulitis of lower extremity  Presented with bloody bowel movements X 1 day with water in a toilet turning red Total of 3 episodes. NO melena, She has hx of GERD and run out of her Protonix. No hx of PUD. Never had colonoscopy. Stool was loose and when flushed had red red tinge to it. Not associated with abdominal pain. Patient never had a colonoscopy. She is in daylyaspirin. Denies epigastric pain no fever or chills no nausea vomiting Denies lightheadedness no chest pain no shortness of breath, no Nausea vomiting, she have had some back pain.  Regarding pertinent Chronic problems:Patient has polyarteritis nodosa,followed by rheumatology and treated with steroids and CellCept He has morbid obesity complicated by venous insufficiency sleep apnea and  recurrent lower extremity cellulitis secondary to lymphedema  Hospital Course:  [name] is a 45 year old female with a past medical history of polyarteritis nodosum, psoriasis, fibromyalgia, morbid obesity, who had been on prednisone therapy, presented as a transfer from outside hospital for further evaluation of bloody stools. She reported yesterday having several episodes of maroon colored stools, developing generalized weakness and poor tolerance to physical exertion. She had also reported being off of for omeprazole. Overnight labs revealed a hemoglobin of 6.5. Looking at previous lab work from 11/24/2015 she had a hemoglobin of 10.8 at the time. Beingon chronic prednisone therapy, there was concern for an upper GI bleed. She was started on IV Protonix 40 mg twice daily.  1. Suspected upper GI bleed -[name] is a 45 year old female on chronic prednisone therapy for rheumatologic condition, presented after having several episodes of maroon colored stools at home. Lab work revealed hemoglobin of 6.5.  -Chronic steroids recently concern for upper GI bleed. -She was started on Protonix 40 mg IV twice daily -Continue blood transfusions, I have discussed case with [name] from [place] who will consult. -On 01/14/2016 she underwent upper endoscopy found to have multiple large clean-based ulcers in the gastric pouch and gastrojejunal anastomosis.  -GI recommended discharging on Protonix and one month of Carafate  2. Acute blood loss anemia -Lab work showing hemoglobin of 6.5, reporting having last bloody bowel movement yesterday evening -On 01/14/2016: She has been typed and crossed and has received 1 unit of packed blood cells, plan to transfuse a second unit of blood this morning -On 01/15/2016  patient having further episodes of GI bleed. A.m. lab work showing hemoglobin of 8.4 -Clinically looks much better with anemia symptoms resolving.  3. History of polyarteritis  nodosa -She currently receives her care at [facility] rheumatology, status post biopsy in 2016. -Continue CellCept 1500 mg twice a day and prednisone 15 mg by mouth daily  4. Chronic back pain -Continue Neurontin 600 mg by mouth 3 times a day  Procedures:  EGD performed on 01/14/2016 Impression: - Z-line regular. - Gastric bypass with a large-sized pouch and  intact staple line. Multiple large clean based  ulcers in the gastric pouch and gastro jejunal  anastomosis likely etiology of blood loss. - No specimens collected.   Consultations:  GI  Discharge Exam:     Vitals:   01/15/16 0034 01/15/16 0519  BP: (!) 147/73 (!) 154/92  Pulse: 70 73  Resp: 19   Temp: 97.4 F (36.3 C) 98.1 F (36.7 C)    General exam:Looks much better, asking to go home Respiratory system: Clear to auscultation. Respiratory effort normal. Cardiovascular system:S1 &S2 heard, RRR. No JVD, murmurs, rubs, gallops or clicks. No pedal edema. Gastrointestinal system:Abdomen is nondistended, soft and nontender. No organomegaly or masses felt. Normal bowel sounds heard. Central nervous system:Alert and oriented. No focal neurological deficits. Extremities: Symmetric 5 x 5 power. Skin: No rashes, lesions or ulcers Psychiatry:Judgement and insight appear normal. Mood &affect appropriate.    Discharge Instructions       Discharge Instructions    Call MD for:    Complete by:  As directed   Call MD for:  difficulty breathing, headache or visual disturbances    Complete by:  As directed   Call MD for:  extreme fatigue    Complete by:  As directed   Call MD for:  hives    Complete by:  As directed   Call MD for:  persistant dizziness or light-headedness    Complete by:  As directed   Call MD for:  persistant nausea and vomiting    Complete by:  As  directed   Call MD for:  redness, tenderness, or signs of infection (pain, swelling, redness, odor or green/yellow discharge around incision site)    Complete by:  As directed   Call MD for:  severe uncontrolled pain    Complete by:  As directed   Call MD for:  temperature >100.4    Complete by:  As directed   Diet - low sodium heart healthy    Complete by:  As directed   Increase activity slowly    Complete by:  As directed         Current Discharge Medication List        START taking these medications   Details  mupirocin ointment (BACTROBAN) 2 % Place 1 application into the nose 2 (two) times daily. Qty: 22 g, Refills: 0    pantoprazole (PROTONIX) 40 MG tablet Take 1 tablet (40 mg total) by mouth daily. Qty: 90 tablet, Refills: 1    sucralfate (CARAFATE) 1 g tablet Take 1 tablet (1 g total) by mouth 4 (four) times daily -  with meals and at bedtime. Qty: 90 tablet, Refills: 0          CONTINUE these medications which have NOT CHANGED   Details  ALPRAZolam (XANAX) 0.5 MG tablet TAKE 1 TABLET BY MOUTH 3 TIMES DAILY AS NEEDED FOR ANXIETY OR SLEEP Qty: 90 tablet, Refills: 1    ARIPiprazole (ABILIFY) 10 MG tablet  Take 10 mg by mouth daily.    Armodafinil 250 MG tablet Take 250 mg by mouth 2 (two) times daily.     BIOTIN PO Take 1 tablet by mouth daily.    calcium citrate (CALCITRATE - DOSED IN MG ELEMENTAL CALCIUM) 950 MG tablet Take 1 tablet by mouth daily.    clonazePAM (KLONOPIN) 1 MG tablet TAKE 1 TABLET BY MOUTH AT BEDTIME AS NEEDED FOR RESTLESS LEG SYNDROME Qty: 30 tablet, Refills: 3    cyclobenzaprine (FLEXERIL) 10 MG tablet Take 1 tablet (10 mg total) by mouth 3 (three) times daily as needed. Qty: 270 tablet, Refills: 0    DULoxetine (CYMBALTA) 60 MG capsule Take 2 capsules (120 mg total) by mouth daily. Qty: 240 capsule, Refills: 2    furosemide (LASIX) 40 MG tablet Take 1.5 tablets (60 mg total) by mouth daily. Qty: 135 tablet, Refills: 0     gabapentin (NEURONTIN) 100 MG capsule Take 200-300 mg by mouth 3 (three) times daily as needed (for pain).    hydroxychloroquine (PLAQUENIL) 200 MG tablet Take 200 mg by mouth 2 (two) times daily.    lamoTRIgine (LAMICTAL) 200 MG tablet Take 200 mg by mouth 2 (two) times daily.     mycophenolate (CELLCEPT) 500 MG tablet Take 1,500 mg by mouth 2 (two) times daily.    nebivolol (BYSTOLIC) 10 MG tablet Take 1 tablet (10 mg total) by mouth daily. Qty: 90 tablet, Refills: 1    oxyCODONE-acetaminophen (PERCOCET) 7.5-325 MG tablet Take 1 tablet by mouth every 8 (eight) hours as needed for severe pain. Qty: 60 tablet, Refills: 0    predniSONE (DELTASONE) 10 MG tablet TAKE ONE TABLET BY MOUTH EVERY DAY WITH FOOD Qty: 30 tablet, Refills: 3    promethazine (PHENERGAN) 25 MG tablet TAKE 1 TABLET BY MOUTH EVERY 6 HOURS AS NEEDED FOR NAUSEA AND VOMITING Qty: 60 tablet, Refills: 1    rosuvastatin (CRESTOR) 20 MG tablet TAKE 1 TABLET BY MOUTH EVERY DAY Qty: 90 tablet, Refills: 0    Needles & Syringes MISC Use one 51mL syringe and 25 G 1" needle each time B12 is injected. B12 needed once a month Qty: 24 each, Refills: 0         STOP taking these medications     omeprazole (PRILOSEC) 40 MG capsule      gabapentin (NEURONTIN) 600 MG tablet             Allergies  Allergen Reactions  . Niacin Other (See Comments)    REACTION: Swelling, problems breathing  . Sulfamethoxazole-Trimethoprim Other (See Comments)    REACTION: Rash, throat closed, eyes swelled.  . Aspirin Hives  . Bactrim Hives  . Benzoin Compound Rash  . Cephalexin Hives  . Clindamycin/Lincomycin Hives  . Doxycycline Hives  . Iohexol Hives    Pt treated with PO benedryl  . Lisinopril Hives  . Naproxen Hives  . Pnu-Imune [Pneumococcal Polysaccharide Vaccine] Rash  . Sulfonamide Derivatives Rash      Follow-up Information    [name], MD Follow up in 1 week(s).   Specialty:   Family Medicine Contact information: [address] [place] [address] [phone]             The results of significant diagnostics from this hospitalization (including imaging, microbiology, ancillary and laboratory) are listed below for reference.    Significant Diagnostic Studies:  Imaging Results  Mm Digital Screening Bilateral  Result Date: 12/29/2015 CLINICAL DATA:  Screening. EXAM: DIGITAL SCREENING  BILATERAL MAMMOGRAM WITH CAD COMPARISON:  Previous exam(s). ACR Breast Density Category a: The breast tissue is almost entirely fatty. FINDINGS: In the left breast, a possible mass warrants further evaluation. In the right breast, no findings suspicious for malignancy. Images were processed with CAD. IMPRESSION: Further evaluation is suggested for possible mass in the left breast. RECOMMENDATION: Diagnostic mammogram and possibly ultrasound of the left breast. (Code:[id number]) The patient will be contacted regarding the findings, and additional imaging will be scheduled. BI-RADS CATEGORY  0: Incomplete. Need additional imaging evaluation and/or prior mammograms for comparison. Electronically Signed   By: Lajean Manes M.D.   On: 12/29/2015 11:17   US Breast Ltd Uni Left Inc Axilla  Result Date: 01/06/2016 CLINICAL DATA:  The patient was called back from screening mammography due to a left breast mass EXAM: 2D DIGITAL DIAGNOSTIC LEFT MAMMOGRAM WITH CAD AND ADJUNCT TOMO ULTRASOUND LEFT BREAST COMPARISON:  Previous exam(s). ACR Breast Density Category b: There are scattered areas of fibroglandular density. FINDINGS: There is an intramammary lymph node in the lateral central left breast which has gotten larger in the interval. It measures approximately 8 mm today versus fibrous 6 mm on more remote studies. Mammographic images were processed with CAD. On physical exam, no suspicious abnormalities are identified. Targeted ultrasound is performed, showing an intramammary  lymph node measuring up 8 mm in greatest dimension with a cortex measuring between 3 and 4 mm. IMPRESSION: There is a growing lymph node in the lateral central left breast. Upon questioning, the patient states she was recently diagnosed with vasculitis and is on therapy including steroids. The lymph node is indeterminate but likely reactive. RECOMMENDATION: Recommend a three-month follow-up mammogram and ultrasound of the probably benign, likely reactive, lymph node. I have discussed the findings and recommendations with the patient. Results were also provided in writing at the conclusion of the visit. If applicable, a reminder letter will be sent to the patient regarding the next appointment. BI-RADS CATEGORY  3: Probably benign. Electronically Signed   By: Dorise Bullion III M.D   On: 01/06/2016 12:13   Mm Diag Breast Tomo Uni Left  Result Date: 01/06/2016 CLINICAL DATA:  The patient was called back from screening mammography due to a left breast mass EXAM: 2D DIGITAL DIAGNOSTIC LEFT MAMMOGRAM WITH CAD AND ADJUNCT TOMO ULTRASOUND LEFT BREAST COMPARISON:  Previous exam(s). ACR Breast Density Category b: There are scattered areas of fibroglandular density. FINDINGS: There is an intramammary lymph node in the lateral central left breast which has gotten larger in the interval. It measures approximately 8 mm today versus fibrous 6 mm on more remote studies. Mammographic images were processed with CAD. On physical exam, no suspicious abnormalities are identified. Targeted ultrasound is performed, showing an intramammary lymph node measuring up 8 mm in greatest dimension with a cortex measuring between 3 and 4 mm. IMPRESSION: There is a growing lymph node in the lateral central left breast. Upon questioning, the patient states she was recently diagnosed with vasculitis and is on therapy including steroids. The lymph node is indeterminate but likely reactive. RECOMMENDATION: Recommend a three-month follow-up  mammogram and ultrasound of the probably benign, likely reactive, lymph node. I have discussed the findings and recommendations with the patient. Results were also provided in writing at the conclusion of the visit. If applicable, a reminder letter will be sent to the patient regarding the next appointment. BI-RADS CATEGORY  3: Probably benign. Electronically Signed   By: Dorise Bullion III M.D   On: 01/06/2016  12:13     Microbiology:        Recent Results (from the past 240 hour(s))  MRSA PCR Screening     Status: Abnormal   Collection Time: 01/14/16 12:33 AM  Result Value Ref Range Status   MRSA by PCR POSITIVE (A) NEGATIVE Final    Comment: RESULT CALLED TO, READ BACK BY AND VERIFIED WITH: KELSEY,RN ON 2C @0359  01/14/16 MKELLY        The GeneXpert MRSA Assay (FDA approved for NASAL specimens only), is one component of a comprehensive MRSA colonization surveillance program. It is not intended to diagnose MRSA infection nor to guide or monitor treatment for MRSA infections.     Labs: Basic Metabolic Panel:  Last Labs    Recent Labs Lab 01/14/16 0737 01/15/16 0320  NA 139 138  K 4.3 4.0  CL 106 105  CO2 27 27  GLUCOSE 88 96  BUN 11 7  CREATININE 0.66 0.69  CALCIUM 8.6* 9.1  MG 2.3  --   PHOS 4.6  --      Liver Function Tests:  Last Labs    Recent Labs Lab 01/14/16 0737  AST 14*  ALT 13*  ALKPHOS 70  BILITOT 0.4  PROT 5.2*  ALBUMIN 3.1*     Last Labs   No results for input(s): LIPASE, AMYLASE in the last 168 hours.   Last Labs   No results for input(s): AMMONIA in the last 168 hours.   CBC:  Last Labs    Recent Labs Lab 01/14/16 0124 01/14/16 0737 01/14/16 2146 01/15/16 0320  WBC 10.1 9.2 11.9* 10.1  HGB 6.5* 7.2*  7.1* 8.7* 8.2*  HCT 22.5* 24.5*  24.8* 29.5* 28.0*  MCV 89.6 89.2 89.1 89.5  PLT 305 299 326 292     Cardiac Enzymes: Last Labs   No results for input(s): CKTOTAL, CKMB, CKMBINDEX, TROPONINI in the last 168  hours.   BNP: BNP (last 3 results) Recent Labs (within last 365 days)  No results for input(s): BNP in the last 8760 hours.    ProBNP (last 3 results) Recent Labs (within last 365 days)  No results for input(s): PROBNP in the last 8760 hours.    CBG: Last Labs   No results for input(s): GLUCAP in the last 168 hours.       SignedCoralyn Pear, Physician Discharge Summary  [name] [id number] DOB: 1970/10/11 DOA: 01/14/2016  PCP: [name], MD  Admit date: 01/14/2016 Discharge date: 01/15/2016  Time spent: 35 minutes  Recommendations for Outpatient Follow-up:  2. Please follow up on CBC on hospital follow up visit, she was admitted for GI bleed and transfused with 2 units of PRBC's. Had Hg of 8.2 on 8.3 on day of discharge.    Discharge Diagnoses:  Active Problems:   Morbid obesity ([place])   Essential hypertension   [name] (obstructive sleep apnea)   PAN (polyarteritis nodosa) (HCC)   BRBPR (bright red blood per rectum)   Acute blood loss anemia   Bright red blood per rectum   Melena   Multiple gastric ulcers   Discharge Condition: Stable  Diet recommendation: Heart Healthy  Filed Weights   01/14/16 0028  Weight: (!) 166.5 kg (367 lb 1.6 oz)    History of present illness:  Erica MCPHETRIDGE a 45 y.o.femalewith medical history significant of polyarteritis nodosa, psoriasis Fibromyalgia 1 anxiety, sleep apnea, HTN, HLD, DJD, morbid obesity, lymphedema or venous insufficiency, hxof recurrent cellulitis of lower extremity  Presented  with bloody bowel movements X 1 day with water in a toilet turning red Total of 3 episodes. NO melena, She has hx of GERD and run out of her Protonix. No hx of PUD. Never had colonoscopy. Stool was loose and when flushed had red red tinge to it. Not associated with abdominal pain. Patient never had a colonoscopy. She is in daylyaspirin. Denies epigastric pain no fever or chills no nausea  vomiting Denies lightheadedness no chest pain no shortness of breath, no Nausea vomiting, she have had some back pain.  Regarding pertinent Chronic problems:Patient has polyarteritis nodosa,followed by rheumatology and treated with steroids and CellCept He has morbid obesity complicated by venous insufficiency sleep apnea and recurrent lower extremity cellulitis secondary to lymphedema  Hospital Course:  Erica Pulkrabek is a 45 year old female with a past medical history of polyarteritis nodosum, psoriasis, fibromyalgia, morbid obesity, who had been on prednisone therapy, presented as a transfer from outside hospital for further evaluation of bloody stools. She reported yesterday having several episodes of maroon colored stools, developing generalized weakness and poor tolerance to physical exertion. She had also reported being off of for omeprazole. Overnight labs revealed a hemoglobin of 6.5. Looking at previous lab work from 11/24/2015 she had a hemoglobin of 10.8 at the time. Beingon chronic prednisone therapy, there was concern for an upper GI bleed. She was started on IV Protonix 40 mg twice daily.  1. Suspected upper GI bleed -Erica Adelsberger is a 45 year old female on chronic prednisone therapy for rheumatologic condition, presented after having several episodes of maroon colored stools at home. Lab work revealed hemoglobin of 6.5.  -Chronic steroids recently concern for upper GI bleed. -She was started on Protonix 40 mg IV twice daily -Continue blood transfusions, I have discussed case with [name] from [place] who will consult. -On 01/14/2016 she underwent upper endoscopy found to have multiple large clean-based ulcers in the gastric pouch and gastrojejunal anastomosis.  -GI recommended discharging on Protonix and one month of Carafate  2. Acute blood loss anemia -Lab work showing hemoglobin of 6.5, reporting having last bloody bowel movement yesterday evening -On 01/14/2016: She  has been typed and crossed and has received 1 unit of packed blood cells, plan to transfuse a second unit of blood this morning -On 01/15/2016 patient having further episodes of GI bleed. A.m. lab work showing hemoglobin of 8.4 -Clinically looks much better with anemia symptoms resolving.  3. History of polyarteritis nodosa -She currently receives her care at [facility] rheumatology, status post biopsy in 2016. -Continue CellCept 1500 mg twice a day and prednisone 15 mg by mouth daily  4. Chronic back pain -Continue Neurontin 600 mg by mouth 3 times a day  Procedures:  EGD performed on 01/14/2016 Impression: - Z-line regular. - Gastric bypass with a large-sized pouch and  intact staple line. Multiple large clean based  ulcers in the gastric pouch and gastro jejunal  anastomosis likely etiology of blood loss. - No specimens collected.   Consultations:  GI  Discharge Exam:     Vitals:   01/15/16 0034 01/15/16 0519  BP: (!) 147/73 (!) 154/92  Pulse: 70 73  Resp: 19   Temp: 97.4 F (36.3 C) 98.1 F (36.7 C)    General exam:Looks much better, asking to go home Respiratory system: Clear to auscultation. Respiratory effort normal. Cardiovascular system:S1 &S2 heard, RRR. No JVD, murmurs, rubs, gallops or clicks. No pedal edema. Gastrointestinal system:Abdomen is nondistended, soft and nontender. No organomegaly or masses felt.  Normal bowel sounds heard. Central nervous system:Alert and oriented. No focal neurological deficits. Extremities: Symmetric 5 x 5 power. Skin: No rashes, lesions or ulcers Psychiatry:Judgement and insight appear normal. Mood &affect appropriate.    Discharge Instructions       Discharge Instructions    Call MD for:    Complete by:  As directed   Call MD for:  difficulty breathing, headache or  visual disturbances    Complete by:  As directed   Call MD for:  extreme fatigue    Complete by:  As directed   Call MD for:  hives    Complete by:  As directed   Call MD for:  persistant dizziness or light-headedness    Complete by:  As directed   Call MD for:  persistant nausea and vomiting    Complete by:  As directed   Call MD for:  redness, tenderness, or signs of infection (pain, swelling, redness, odor or green/yellow discharge around incision site)    Complete by:  As directed   Call MD for:  severe uncontrolled pain    Complete by:  As directed   Call MD for:  temperature >100.4    Complete by:  As directed   Diet - low sodium heart healthy    Complete by:  As directed   Increase activity slowly    Complete by:  As directed         Current Discharge Medication List        START taking these medications   Details  mupirocin ointment (BACTROBAN) 2 % Place 1 application into the nose 2 (two) times daily. Qty: 22 g, Refills: 0    pantoprazole (PROTONIX) 40 MG tablet Take 1 tablet (40 mg total) by mouth daily. Qty: 90 tablet, Refills: 1    sucralfate (CARAFATE) 1 g tablet Take 1 tablet (1 g total) by mouth 4 (four) times daily -  with meals and at bedtime. Qty: 90 tablet, Refills: 0          CONTINUE these medications which have NOT CHANGED   Details  ALPRAZolam (XANAX) 0.5 MG tablet TAKE 1 TABLET BY MOUTH 3 TIMES DAILY AS NEEDED FOR ANXIETY OR SLEEP Qty: 90 tablet, Refills: 1    ARIPiprazole (ABILIFY) 10 MG tablet Take 10 mg by mouth daily.    Armodafinil 250 MG tablet Take 250 mg by mouth 2 (two) times daily.     BIOTIN PO Take 1 tablet by mouth daily.    calcium citrate (CALCITRATE - DOSED IN MG ELEMENTAL CALCIUM) 950 MG tablet Take 1 tablet by mouth daily.    clonazePAM (KLONOPIN) 1 MG tablet TAKE 1 TABLET BY MOUTH AT BEDTIME AS NEEDED FOR RESTLESS LEG SYNDROME Qty: 30 tablet, Refills: 3    cyclobenzaprine (FLEXERIL) 10 MG tablet Take 1  tablet (10 mg total) by mouth 3 (three) times daily as needed. Qty: 270 tablet, Refills: 0    DULoxetine (CYMBALTA) 60 MG capsule Take 2 capsules (120 mg total) by mouth daily. Qty: 240 capsule, Refills: 2    furosemide (LASIX) 40 MG tablet Take 1.5 tablets (60 mg total) by mouth daily. Qty: 135 tablet, Refills: 0    gabapentin (NEURONTIN) 100 MG capsule Take 200-300 mg by mouth 3 (three) times daily as needed (for pain).    hydroxychloroquine (PLAQUENIL) 200 MG tablet Take 200 mg by mouth 2 (two) times daily.    lamoTRIgine (LAMICTAL) 200 MG tablet Take 200 mg by mouth 2 (two) times  daily.     mycophenolate (CELLCEPT) 500 MG tablet Take 1,500 mg by mouth 2 (two) times daily.    nebivolol (BYSTOLIC) 10 MG tablet Take 1 tablet (10 mg total) by mouth daily. Qty: 90 tablet, Refills: 1    oxyCODONE-acetaminophen (PERCOCET) 7.5-325 MG tablet Take 1 tablet by mouth every 8 (eight) hours as needed for severe pain. Qty: 60 tablet, Refills: 0    predniSONE (DELTASONE) 10 MG tablet TAKE ONE TABLET BY MOUTH EVERY DAY WITH FOOD Qty: 30 tablet, Refills: 3    promethazine (PHENERGAN) 25 MG tablet TAKE 1 TABLET BY MOUTH EVERY 6 HOURS AS NEEDED FOR NAUSEA AND VOMITING Qty: 60 tablet, Refills: 1    rosuvastatin (CRESTOR) 20 MG tablet TAKE 1 TABLET BY MOUTH EVERY DAY Qty: 90 tablet, Refills: 0    Needles & Syringes MISC Use one 90mL syringe and 25 G 1" needle each time B12 is injected. B12 needed once a month Qty: 24 each, Refills: 0         STOP taking these medications     omeprazole (PRILOSEC) 40 MG capsule      gabapentin (NEURONTIN) 600 MG tablet             Allergies  Allergen Reactions  . Niacin Other (See Comments)    REACTION: Swelling, problems breathing  . Sulfamethoxazole-Trimethoprim Other (See Comments)    REACTION: Rash, throat closed, eyes swelled.  . Aspirin Hives  . Bactrim Hives  . Benzoin Compound Rash  . Cephalexin Hives  .  Clindamycin/Lincomycin Hives  . Doxycycline Hives  . Iohexol Hives    Pt treated with PO benedryl  . Lisinopril Hives  . Naproxen Hives  . Pnu-Imune [Pneumococcal Polysaccharide Vaccine] Rash  . Sulfonamide Derivatives Rash      Follow-up Information    [name], MD Follow up in 1 week(s).   Specialty:  Family Medicine Contact information: [address] [place] [address] [phone]             The results of significant diagnostics from this hospitalization (including imaging, microbiology, ancillary and laboratory) are listed below for reference.    Significant Diagnostic Studies:  Imaging Results  Mm Digital Screening Bilateral  Result Date: 12/29/2015 CLINICAL DATA:  Screening. EXAM: DIGITAL SCREENING BILATERAL MAMMOGRAM WITH CAD COMPARISON:  Previous exam(s). ACR Breast Density Category a: The breast tissue is almost entirely fatty. FINDINGS: In the left breast, a possible mass warrants further evaluation. In the right breast, no findings suspicious for malignancy. Images were processed with CAD. IMPRESSION: Further evaluation is suggested for possible mass in the left breast. RECOMMENDATION: Diagnostic mammogram and possibly ultrasound of the left breast. (Code:[id number]) The patient will be contacted regarding the findings, and additional imaging will be scheduled. BI-RADS CATEGORY  0: Incomplete. Need additional imaging evaluation and/or prior mammograms for comparison. Electronically Signed   By: Lajean Manes M.D.   On: 12/29/2015 11:17   US Breast Ltd Uni Left Inc Axilla  Result Date: 01/06/2016 CLINICAL DATA:  The patient was called back from screening mammography due to a left breast mass EXAM: 2D DIGITAL DIAGNOSTIC LEFT MAMMOGRAM WITH CAD AND ADJUNCT TOMO ULTRASOUND LEFT BREAST COMPARISON:  Previous exam(s). ACR Breast Density Category b: There are scattered areas of fibroglandular density. FINDINGS: There is an intramammary lymph  node in the lateral central left breast which has gotten larger in the interval. It measures approximately 8 mm today versus fibrous 6 mm on more remote studies. Mammographic images  were processed with CAD. On physical exam, no suspicious abnormalities are identified. Targeted ultrasound is performed, showing an intramammary lymph node measuring up 8 mm in greatest dimension with a cortex measuring between 3 and 4 mm. IMPRESSION: There is a growing lymph node in the lateral central left breast. Upon questioning, the patient states she was recently diagnosed with vasculitis and is on therapy including steroids. The lymph node is indeterminate but likely reactive. RECOMMENDATION: Recommend a three-month follow-up mammogram and ultrasound of the probably benign, likely reactive, lymph node. I have discussed the findings and recommendations with the patient. Results were also provided in writing at the conclusion of the visit. If applicable, a reminder letter will be sent to the patient regarding the next appointment. BI-RADS CATEGORY  3: Probably benign. Electronically Signed   By: Dorise Bullion III M.D   On: 01/06/2016 12:13   Mm Diag Breast Tomo Uni Left  Result Date: 01/06/2016 CLINICAL DATA:  The patient was called back from screening mammography due to a left breast mass EXAM: 2D DIGITAL DIAGNOSTIC LEFT MAMMOGRAM WITH CAD AND ADJUNCT TOMO ULTRASOUND LEFT BREAST COMPARISON:  Previous exam(s). ACR Breast Density Category b: There are scattered areas of fibroglandular density. FINDINGS: There is an intramammary lymph node in the lateral central left breast which has gotten larger in the interval. It measures approximately 8 mm today versus fibrous 6 mm on more remote studies. Mammographic images were processed with CAD. On physical exam, no suspicious abnormalities are identified. Targeted ultrasound is performed, showing an intramammary lymph node measuring up 8 mm in greatest dimension with a cortex  measuring between 3 and 4 mm. IMPRESSION: There is a growing lymph node in the lateral central left breast. Upon questioning, the patient states she was recently diagnosed with vasculitis and is on therapy including steroids. The lymph node is indeterminate but likely reactive. RECOMMENDATION: Recommend a three-month follow-up mammogram and ultrasound of the probably benign, likely reactive, lymph node. I have discussed the findings and recommendations with the patient. Results were also provided in writing at the conclusion of the visit. If applicable, a reminder letter will be sent to the patient regarding the next appointment. BI-RADS CATEGORY  3: Probably benign. Electronically Signed   By: Dorise Bullion III M.D   On: 01/06/2016 12:13     Microbiology:        Recent Results (from the past 240 hour(s))  MRSA PCR Screening     Status: Abnormal   Collection Time: 01/14/16 12:33 AM  Result Value Ref Range Status   MRSA by PCR POSITIVE (A) NEGATIVE Final    Comment: RESULT CALLED TO, READ BACK BY AND VERIFIED WITH: KELSEY,RN ON 2C @0359  01/14/16 MKELLY        The GeneXpert MRSA Assay (FDA approved for NASAL specimens only), is one component of a comprehensive MRSA colonization surveillance program. It is not intended to diagnose MRSA infection nor to guide or monitor treatment for MRSA infections.     Labs: Basic Metabolic Panel:  Last Labs    Recent Labs Lab 01/14/16 0737 01/15/16 0320  NA 139 138  K 4.3 4.0  CL 106 105  CO2 27 27  GLUCOSE 88 96  BUN 11 7  CREATININE 0.66 0.69  CALCIUM 8.6* 9.1  MG 2.3  --   PHOS 4.6  --      Liver Function Tests:  Last Labs    Recent Labs Lab 01/14/16 0737  AST 14*  ALT 13*  ALKPHOS 70  BILITOT 0.4  PROT 5.2*  ALBUMIN 3.1*     Last Labs   No results for input(s): LIPASE, AMYLASE in the last 168 hours.   Last Labs   No results for input(s): AMMONIA in the last 168 hours.   CBC:  Last Labs    Recent  Labs Lab 01/14/16 0124 01/14/16 0737 01/14/16 2146 01/15/16 0320  WBC 10.1 9.2 11.9* 10.1  HGB 6.5* 7.2*  7.1* 8.7* 8.2*  HCT 22.5* 24.5*  24.8* 29.5* 28.0*  MCV 89.6 89.2 89.1 89.5  PLT 305 299 326 292     Cardiac Enzymes: Last Labs   No results for input(s): CKTOTAL, CKMB, CKMBINDEX, TROPONINI in the last 168 hours.   BNP: BNP (last 3 results) Recent Labs (within last 365 days)  No results for input(s): BNP in the last 8760 hours.    ProBNP (last 3 results) Recent Labs (within last 365 days)  No results for input(s): PROBNP in the last 8760 hours.    CBG: Last Labs   No results for input(s): GLUCAP in the last 168 hours.       Signed:  Kelvin Cellar MD.  [facility] 01/15/2016, 12:36 PM    Angee Gupton MD.  [facility] 01/15/2016, 12:36 PM

## 2016-01-19 ENCOUNTER — Telehealth: Payer: Self-pay | Admitting: Family Medicine

## 2016-01-19 MED ORDER — OXYCODONE-ACETAMINOPHEN 7.5-325 MG PO TABS: 1.0000 | ORAL_TABLET | Freq: Three times a day (TID) | ORAL | 0 refills | Status: DC | PRN

## 2016-01-19 NOTE — Telephone Encounter (Signed)
Ok for refill? 

## 2016-01-19 NOTE — Telephone Encounter (Signed)
Med filled and pt made aware available at front desk for pick up.   

## 2016-01-19 NOTE — Telephone Encounter (Signed)
Last OV 12/28/15 Oxycodone last filled 12/22/15 #60 with 0

## 2016-01-19 NOTE — Telephone Encounter (Signed)
Pt needs refill on percocet

## 2016-01-20 ENCOUNTER — Telehealth: Payer: Self-pay | Admitting: *Deleted

## 2016-01-20 NOTE — Telephone Encounter (Signed)
Transition Care Management Follow-up Telephone Call  Per Discharge Summary:  PCP: [name], MD  Admit date: 01/14/2016 Discharge date:01/15/2016  Time spent: 32minutes  Recommendations for Outpatient Follow-up: 1. Please follow up on CBC on hospital follow up visit, she was admitted for GI bleed and transfused with 2 units of PRBC's. Had Hg of 8.2 on 8.3 on day of discharge.    Discharge Diagnoses: Active Problems: Morbid obesity ([place]) Essential hypertension [name] (obstructive sleep apnea) PAN (polyarteritis nodosa) ([facility]) BRBPR (bright red blood per rectum) Acute blood loss anemia Bright red blood per rectum Melena Multiple gastric ulcers   Discharge Condition:Stable  Diet recommendation: Heart Healthy  --   How have you been since you were released from the hospital? "I'm okay, I'm still recovering. I feel tired and weak but it's getting better each day." Pt sounds groggy over the phone, but reports she feels well.    Do you understand why you were in the hospital? yes   Do you understand the discharge instructions? yes   Where were you discharged to? Home   Items Reviewed:  Medications reviewed: yes  Allergies reviewed: yes  Dietary changes reviewed: yes, heart healthy  Referrals reviewed: no, none made   Functional Questionnaire:   Activities of Daily Living (ADLs):   She states they are independent in the following: ambulation, bathing and hygiene, feeding, continence, grooming, toileting and dressing States they require assistance with the following: none   Any transportation issues/concerns?: no   Any patient concerns? Yes, pt feels like she has some fluid in her L ear. Also has concerns about Protonix and Carafate and how long she should stay on these medications.   Confirmed importance and date/time of follow-up visits scheduled yes  Provider Appointment booked with Dr. [name] 01/25/16 @  11:00am  Confirmed with patient if condition begins to worsen call PCP or go to the ER.  Patient was given the office number and encouraged to call back with question or concerns.  : yes

## 2016-01-22 NOTE — Anesthesia Postprocedure Evaluation (Signed)
Anesthesia Post Note  Patient: [name]  Procedure(s) Performed: Procedure(s) (LRB): ESOPHAGOGASTRODUODENOSCOPY (EGD) (N/A)  Anesthesia Post Evaluation  Last Vitals:  Vitals:   01/15/16 0034 01/15/16 0519  BP: (!) 147/73 (!) 154/92  Pulse: 70 73  Resp: 19   Temp: 36.3 C 36.7 C    Last Pain:  Vitals:   01/15/16 0930  TempSrc:   PainSc: 0-No pain                 Blayde Bacigalupi COKER

## 2016-01-25 ENCOUNTER — Ambulatory Visit (INDEPENDENT_AMBULATORY_CARE_PROVIDER_SITE_OTHER): Payer: Medicare Other | Admitting: Family Medicine

## 2016-01-25 ENCOUNTER — Encounter: Payer: Self-pay | Admitting: Family Medicine

## 2016-01-25 VITALS — BP 139/90 | HR 92 | Temp 97.8°F | Resp 17 | Ht 62.0 in | Wt 364.1 lb

## 2016-01-25 DIAGNOSIS — Z79891 Long term (current) use of opiate analgesic: Secondary | ICD-10-CM | POA: Diagnosis not present

## 2016-01-25 DIAGNOSIS — J019 Acute sinusitis, unspecified: Secondary | ICD-10-CM

## 2016-01-25 DIAGNOSIS — D62 Acute posthemorrhagic anemia: Secondary | ICD-10-CM | POA: Diagnosis not present

## 2016-01-25 DIAGNOSIS — Z79899 Other long term (current) drug therapy: Secondary | ICD-10-CM | POA: Diagnosis not present

## 2016-01-25 LAB — CBC WITH DIFFERENTIAL/PLATELET
BASOS PCT: 0.2 % (ref 0.0–3.0)
Basophils Absolute: 0 10*3/uL (ref 0.0–0.1)
EOS ABS: 0.4 10*3/uL (ref 0.0–0.7)
Eosinophils Relative: 3.6 % (ref 0.0–5.0)
Hemoglobin: 8.4 g/dL — ABNORMAL LOW (ref 12.0–15.0)
LYMPHS PCT: 17.8 % (ref 12.0–46.0)
Lymphs Abs: 1.8 10*3/uL (ref 0.7–4.0)
MCHC: 31.7 g/dL (ref 30.0–36.0)
MCV: 80.8 fl (ref 78.0–100.0)
Monocytes Absolute: 0.7 10*3/uL (ref 0.1–1.0)
Monocytes Relative: 7.4 % (ref 3.0–12.0)
NEUTROS ABS: 7.1 10*3/uL (ref 1.4–7.7)
Neutrophils Relative %: 71 % (ref 43.0–77.0)
PLATELETS: 352 10*3/uL (ref 150.0–400.0)
RBC: 3.27 Mil/uL — ABNORMAL LOW (ref 3.87–5.11)
RDW: 17.6 % — AB (ref 11.5–15.5)
WBC: 10 10*3/uL (ref 4.0–10.5)

## 2016-01-25 MED ORDER — AMOXICILLIN 875 MG PO TABS: 875.0000 mg | ORAL_TABLET | Freq: Two times a day (BID) | ORAL | 0 refills | Status: DC

## 2016-01-25 NOTE — Assessment & Plan Note (Signed)
New.  Pt had multiple bleeding gastric ulcers and required transfusion of 2 units.  Now on Protonix and Carafate.  No evidence of bleeding since d/c.  She is not currently on an iron supplement- will start this if Hgb remains low.  Pt advised to avoid all NSAIDs.  Will follow closely.

## 2016-01-25 NOTE — Assessment & Plan Note (Signed)
Pt's sxs and PE consistent w/ infxn.  Start abx.  Reviewed supportive care and red flags that should prompt return.  Pt expressed understanding and is in agreement w/ plan.  

## 2016-01-25 NOTE — Progress Notes (Signed)
Pre visit review using our clinic review tool, if applicable. No additional management support is needed unless otherwise documented below in the visit note. 

## 2016-01-25 NOTE — Patient Instructions (Signed)
Follow up as scheduled/needed We'll notify you of your lab results and make any changes if needed Start the Amoxicillin twice daily- take w/ food Drink plenty of fluids Call with any questions or concerns Happy Fall!!

## 2016-01-25 NOTE — Progress Notes (Signed)
   Subjective:    Patient ID: [name], female    DOB: 01-24-1971, 45 y.o.   MRN: [id number]  HPI Hospital f/u- pt was admitted 8/31-9/1 w/ GI bleed.  Hgb at admission was 6.5.  Received 2 units PRBCs and hgb improved to 8.4  Pt had endoscopy that showed multiple gastric ulcers and pt was d/c'd on Protonix and Carafate x1 month.  Reviewed d/c summary, endoscopy report and labs for today's visit.  Pt denies any GI blood loss since d/c.  Pt reports some improvement in energy since hospitalization.  Remains pale.  No longer dizzy.  Denies SOB.  URI- pt reports 3 days of nasal congestion, sinus pain, hoarseness.  No cough.  No fevers.  + facial pain, tooth pain.  Review of Systems For ROS see HPI     Objective:   Physical Exam  Constitutional: She is oriented to person, place, and time. She appears well-developed and well-nourished. No distress.  HENT:  Head: Normocephalic and atraumatic.  Right Ear: Tympanic membrane normal.  Left Ear: Tympanic membrane normal.  Nose: Mucosal edema and rhinorrhea present. Right sinus exhibits maxillary sinus tenderness and frontal sinus tenderness. Left sinus exhibits maxillary sinus tenderness and frontal sinus tenderness.  Mouth/Throat: Uvula is midline and mucous membranes are normal. Posterior oropharyngeal erythema present. No oropharyngeal exudate.  Eyes: Conjunctivae and EOM are normal. Pupils are equal, round, and reactive to light.  Neck: Normal range of motion. Neck supple.  Cardiovascular: Normal rate, regular rhythm and normal heart sounds.   Pulmonary/Chest: Effort normal and breath sounds normal. No respiratory distress. She has no wheezes.  Lymphadenopathy:    She has no cervical adenopathy.  Neurological: She is alert and oriented to person, place, and time.  Skin: Skin is warm and dry.  Psychiatric: She has a normal mood and affect. Her behavior is normal. Thought content normal.  Vitals reviewed.         Assessment & Plan:

## 2016-01-26 ENCOUNTER — Encounter: Payer: Self-pay | Admitting: Family Medicine

## 2016-01-26 ENCOUNTER — Other Ambulatory Visit: Payer: Self-pay | Admitting: General Practice

## 2016-01-26 MED ORDER — FERROUS SULFATE 325 (65 FE) MG PO TABS: 325.0000 mg | ORAL_TABLET | Freq: Every day | ORAL | 6 refills | Status: DC

## 2016-02-03 ENCOUNTER — Encounter: Payer: Self-pay | Admitting: Family Medicine

## 2016-02-09 ENCOUNTER — Encounter: Payer: Self-pay | Admitting: Family Medicine

## 2016-02-10 ENCOUNTER — Other Ambulatory Visit: Payer: Self-pay | Admitting: Family Medicine

## 2016-02-10 DIAGNOSIS — K922 Gastrointestinal hemorrhage, unspecified: Secondary | ICD-10-CM

## 2016-02-11 ENCOUNTER — Other Ambulatory Visit (INDEPENDENT_AMBULATORY_CARE_PROVIDER_SITE_OTHER): Payer: Medicare Other

## 2016-02-11 DIAGNOSIS — K922 Gastrointestinal hemorrhage, unspecified: Secondary | ICD-10-CM | POA: Diagnosis not present

## 2016-02-11 LAB — CBC WITH DIFFERENTIAL/PLATELET
BASOS ABS: 0.1 10*3/uL (ref 0.0–0.1)
Basophils Relative: 0.4 % (ref 0.0–3.0)
EOS ABS: 0.3 10*3/uL (ref 0.0–0.7)
Eosinophils Relative: 2.1 % (ref 0.0–5.0)
HEMATOCRIT: 29.1 % — AB (ref 36.0–46.0)
HEMOGLOBIN: 9 g/dL — AB (ref 12.0–15.0)
LYMPHS PCT: 20.4 % (ref 12.0–46.0)
Lymphs Abs: 2.8 10*3/uL (ref 0.7–4.0)
MCHC: 30.8 g/dL (ref 30.0–36.0)
MCV: 79.8 fl (ref 78.0–100.0)
MONOS PCT: 9.1 % (ref 3.0–12.0)
Monocytes Absolute: 1.3 10*3/uL — ABNORMAL HIGH (ref 0.1–1.0)
NEUTROS ABS: 9.4 10*3/uL — AB (ref 1.4–7.7)
Neutrophils Relative %: 68 % (ref 43.0–77.0)
PLATELETS: 412 10*3/uL — AB (ref 150.0–400.0)
RBC: 3.65 Mil/uL — AB (ref 3.87–5.11)
RDW: 18 % — ABNORMAL HIGH (ref 11.5–15.5)
WBC: 13.9 10*3/uL — AB (ref 4.0–10.5)

## 2016-02-14 ENCOUNTER — Encounter: Payer: Self-pay | Admitting: Family Medicine

## 2016-02-15 ENCOUNTER — Other Ambulatory Visit: Payer: Self-pay | Admitting: General Practice

## 2016-02-15 ENCOUNTER — Other Ambulatory Visit: Payer: Self-pay | Admitting: Family Medicine

## 2016-02-15 MED ORDER — PANTOPRAZOLE SODIUM 40 MG PO TBEC: 40.0000 mg | DELAYED_RELEASE_TABLET | Freq: Two times a day (BID) | ORAL | 3 refills | Status: DC

## 2016-02-15 MED ORDER — OXYCODONE-ACETAMINOPHEN 7.5-325 MG PO TABS: 1.0000 | ORAL_TABLET | Freq: Three times a day (TID) | ORAL | 0 refills | Status: DC | PRN

## 2016-02-15 NOTE — Telephone Encounter (Signed)
Medication filled to pharmacy as requested.   

## 2016-02-15 NOTE — Telephone Encounter (Signed)
Ok to double Protonix to BID, #60, 3 refills

## 2016-02-15 NOTE — Addendum Note (Signed)
Addended by: Davis Gourd on: 02/15/2016 02:33 PM   Modules accepted: Orders

## 2016-02-15 NOTE — Telephone Encounter (Signed)
Patient called back today. She is having increased heartburn. She was taking Omeprazole 40 mg bid before her hospital stay. She is currently taking Protonix 40 mg daily. She wants to know if she can increase Protonix to bid. Please advise

## 2016-02-15 NOTE — Telephone Encounter (Signed)
Pt states that she needs refill on percocet w/ 90 tablets and will p/u on weds at lab appt.

## 2016-02-15 NOTE — Telephone Encounter (Signed)
Last OV 01/25/16 Oxycodone last filled 01/19/16 #60 with 0

## 2016-02-16 ENCOUNTER — Encounter: Payer: Self-pay | Admitting: Family Medicine

## 2016-02-17 ENCOUNTER — Other Ambulatory Visit: Payer: Medicare Other

## 2016-02-18 ENCOUNTER — Other Ambulatory Visit: Payer: Medicare Other

## 2016-02-19 ENCOUNTER — Other Ambulatory Visit: Payer: Medicare Other

## 2016-02-22 ENCOUNTER — Other Ambulatory Visit: Payer: Medicare Other

## 2016-02-23 ENCOUNTER — Other Ambulatory Visit (INDEPENDENT_AMBULATORY_CARE_PROVIDER_SITE_OTHER): Payer: Medicare Other

## 2016-02-23 ENCOUNTER — Telehealth: Payer: Self-pay | Admitting: Emergency Medicine

## 2016-02-23 DIAGNOSIS — Z23 Encounter for immunization: Secondary | ICD-10-CM | POA: Diagnosis not present

## 2016-02-23 DIAGNOSIS — D62 Acute posthemorrhagic anemia: Secondary | ICD-10-CM

## 2016-02-23 LAB — CBC WITH DIFFERENTIAL/PLATELET
BASOS ABS: 0 10*3/uL (ref 0.0–0.1)
Basophils Relative: 0.4 % (ref 0.0–3.0)
EOS ABS: 0.2 10*3/uL (ref 0.0–0.7)
Eosinophils Relative: 2 % (ref 0.0–5.0)
HEMATOCRIT: 29.6 % — AB (ref 36.0–46.0)
HEMOGLOBIN: 9.1 g/dL — AB (ref 12.0–15.0)
LYMPHS PCT: 25.3 % (ref 12.0–46.0)
Lymphs Abs: 2.8 10*3/uL (ref 0.7–4.0)
MCHC: 30.7 g/dL (ref 30.0–36.0)
MCV: 78.7 fl (ref 78.0–100.0)
MONOS PCT: 9.5 % (ref 3.0–12.0)
Monocytes Absolute: 1.1 10*3/uL — ABNORMAL HIGH (ref 0.1–1.0)
Neutro Abs: 7.1 10*3/uL (ref 1.4–7.7)
Neutrophils Relative %: 62.8 % (ref 43.0–77.0)
Platelets: 401 10*3/uL — ABNORMAL HIGH (ref 150.0–400.0)
RBC: 3.76 Mil/uL — ABNORMAL LOW (ref 3.87–5.11)
RDW: 17.3 % — ABNORMAL HIGH (ref 11.5–15.5)
WBC: 11.2 10*3/uL — AB (ref 4.0–10.5)

## 2016-02-23 NOTE — Telephone Encounter (Signed)
She needs to be taking a daily antihistamine (Claritin or Zyrtec) if she is not already.  Continue the Mucinex DM or Delsym.  If no improvement, will need appt

## 2016-02-23 NOTE — Telephone Encounter (Signed)
While patient here for lab visit for recheck CBC.  Patient states she has had a cough-productive-yellow for 1 week now. She has been taking Mucinex DM with little relief. She denies any fever. She received her Flu shot today as well. Please advise of any other medications to take

## 2016-02-23 NOTE — Telephone Encounter (Signed)
Advised patient to start antihistamines which she is already taking. Continue with the Mucinex DM or Delsym and if symptoms does not improve she will need to be seen. She is agreeable.

## 2016-02-25 ENCOUNTER — Encounter: Payer: Self-pay | Admitting: Family Medicine

## 2016-02-25 MED ORDER — MUPIROCIN CALCIUM 2 % EX CREA: 1.0000 "application " | TOPICAL_CREAM | Freq: Two times a day (BID) | CUTANEOUS | 0 refills | Status: DC

## 2016-02-29 ENCOUNTER — Encounter: Payer: Self-pay | Admitting: Family Medicine

## 2016-03-04 ENCOUNTER — Other Ambulatory Visit: Payer: Self-pay | Admitting: Family Medicine

## 2016-03-09 ENCOUNTER — Other Ambulatory Visit: Payer: Self-pay | Admitting: Family Medicine

## 2016-03-09 ENCOUNTER — Encounter: Payer: Self-pay | Admitting: Family Medicine

## 2016-03-09 NOTE — Telephone Encounter (Signed)
Last OV 01/25/16 Alprazolam last filled 12/08/15 #90 with 1

## 2016-03-09 NOTE — Telephone Encounter (Signed)
Medication filled to pharmacy as requested.   

## 2016-03-14 ENCOUNTER — Other Ambulatory Visit: Payer: Self-pay | Admitting: Family Medicine

## 2016-03-14 ENCOUNTER — Other Ambulatory Visit: Payer: Self-pay | Admitting: General Practice

## 2016-03-14 ENCOUNTER — Other Ambulatory Visit (INDEPENDENT_AMBULATORY_CARE_PROVIDER_SITE_OTHER): Payer: Medicare Other

## 2016-03-14 DIAGNOSIS — D649 Anemia, unspecified: Secondary | ICD-10-CM

## 2016-03-14 DIAGNOSIS — N63 Unspecified lump in unspecified breast: Secondary | ICD-10-CM

## 2016-03-14 LAB — CBC WITH DIFFERENTIAL/PLATELET
BASOS PCT: 0.6 % (ref 0.0–3.0)
Basophils Absolute: 0.1 10*3/uL (ref 0.0–0.1)
EOS PCT: 2.2 % (ref 0.0–5.0)
Eosinophils Absolute: 0.2 10*3/uL (ref 0.0–0.7)
HCT: 32.2 % — ABNORMAL LOW (ref 36.0–46.0)
Hemoglobin: 9.9 g/dL — ABNORMAL LOW (ref 12.0–15.0)
LYMPHS ABS: 2.2 10*3/uL (ref 0.7–4.0)
Lymphocytes Relative: 24 % (ref 12.0–46.0)
MCHC: 30.9 g/dL (ref 30.0–36.0)
MCV: 77.2 fl — AB (ref 78.0–100.0)
MONOS PCT: 8.9 % (ref 3.0–12.0)
Monocytes Absolute: 0.8 10*3/uL (ref 0.1–1.0)
NEUTROS ABS: 6 10*3/uL (ref 1.4–7.7)
NEUTROS PCT: 64.3 % (ref 43.0–77.0)
Platelets: 421 10*3/uL — ABNORMAL HIGH (ref 150.0–400.0)
RBC: 4.17 Mil/uL (ref 3.87–5.11)
RDW: 17.5 % — AB (ref 11.5–15.5)
WBC: 9.3 10*3/uL (ref 4.0–10.5)

## 2016-03-14 MED ORDER — OXYCODONE-ACETAMINOPHEN 7.5-325 MG PO TABS: 1.0000 | ORAL_TABLET | Freq: Three times a day (TID) | ORAL | 0 refills | Status: DC | PRN

## 2016-03-15 ENCOUNTER — Encounter: Payer: Self-pay | Admitting: Family Medicine

## 2016-03-15 ENCOUNTER — Other Ambulatory Visit: Payer: Self-pay | Admitting: General Practice

## 2016-03-15 NOTE — Telephone Encounter (Signed)
Last OV 01/25/16 Clonazepam last filled 10/13/15

## 2016-03-16 MED ORDER — CLONAZEPAM 1 MG PO TABS: ORAL_TABLET | ORAL | 3 refills | Status: DC

## 2016-03-16 NOTE — Telephone Encounter (Signed)
Medication filled to pharmacy as requested.   

## 2016-03-18 ENCOUNTER — Encounter: Payer: Self-pay | Admitting: Family Medicine

## 2016-03-28 ENCOUNTER — Other Ambulatory Visit: Payer: Medicare Other

## 2016-03-29 DIAGNOSIS — Z888 Allergy status to other drugs, medicaments and biological substances status: Secondary | ICD-10-CM | POA: Diagnosis not present

## 2016-03-29 DIAGNOSIS — Y9389 Activity, other specified: Secondary | ICD-10-CM | POA: Diagnosis not present

## 2016-03-29 DIAGNOSIS — Y92099 Unspecified place in other non-institutional residence as the place of occurrence of the external cause: Secondary | ICD-10-CM | POA: Diagnosis not present

## 2016-03-29 DIAGNOSIS — F419 Anxiety disorder, unspecified: Secondary | ICD-10-CM | POA: Diagnosis present

## 2016-03-29 DIAGNOSIS — Z882 Allergy status to sulfonamides status: Secondary | ICD-10-CM | POA: Diagnosis not present

## 2016-03-29 DIAGNOSIS — G43909 Migraine, unspecified, not intractable, without status migrainosus: Secondary | ICD-10-CM | POA: Diagnosis present

## 2016-03-29 DIAGNOSIS — R4182 Altered mental status, unspecified: Secondary | ICD-10-CM | POA: Diagnosis not present

## 2016-03-29 DIAGNOSIS — I872 Venous insufficiency (chronic) (peripheral): Secondary | ICD-10-CM | POA: Diagnosis present

## 2016-03-29 DIAGNOSIS — B3789 Other sites of candidiasis: Secondary | ICD-10-CM | POA: Diagnosis not present

## 2016-03-29 DIAGNOSIS — A419 Sepsis, unspecified organism: Secondary | ICD-10-CM | POA: Diagnosis not present

## 2016-03-29 DIAGNOSIS — G92 Toxic encephalopathy: Secondary | ICD-10-CM | POA: Diagnosis not present

## 2016-03-29 DIAGNOSIS — Z6841 Body Mass Index (BMI) 40.0 and over, adult: Secondary | ICD-10-CM | POA: Diagnosis not present

## 2016-03-29 DIAGNOSIS — T43215A Adverse effect of selective serotonin and norepinephrine reuptake inhibitors, initial encounter: Secondary | ICD-10-CM | POA: Diagnosis present

## 2016-03-29 DIAGNOSIS — R451 Restlessness and agitation: Secondary | ICD-10-CM | POA: Diagnosis not present

## 2016-03-29 DIAGNOSIS — G4733 Obstructive sleep apnea (adult) (pediatric): Secondary | ICD-10-CM | POA: Diagnosis not present

## 2016-03-29 DIAGNOSIS — F05 Delirium due to known physiological condition: Secondary | ICD-10-CM | POA: Diagnosis not present

## 2016-03-29 DIAGNOSIS — T426X5A Adverse effect of other antiepileptic and sedative-hypnotic drugs, initial encounter: Secondary | ICD-10-CM | POA: Diagnosis present

## 2016-03-29 DIAGNOSIS — E876 Hypokalemia: Secondary | ICD-10-CM | POA: Diagnosis not present

## 2016-03-29 DIAGNOSIS — Z887 Allergy status to serum and vaccine status: Secondary | ICD-10-CM | POA: Diagnosis not present

## 2016-03-29 DIAGNOSIS — L409 Psoriasis, unspecified: Secondary | ICD-10-CM | POA: Diagnosis present

## 2016-03-29 DIAGNOSIS — E785 Hyperlipidemia, unspecified: Secondary | ICD-10-CM | POA: Diagnosis present

## 2016-03-29 DIAGNOSIS — G894 Chronic pain syndrome: Secondary | ICD-10-CM | POA: Diagnosis present

## 2016-03-29 DIAGNOSIS — S0003XA Contusion of scalp, initial encounter: Secondary | ICD-10-CM | POA: Diagnosis not present

## 2016-03-29 DIAGNOSIS — T380X5A Adverse effect of glucocorticoids and synthetic analogues, initial encounter: Secondary | ICD-10-CM | POA: Diagnosis not present

## 2016-03-29 DIAGNOSIS — S060X0A Concussion without loss of consciousness, initial encounter: Secondary | ICD-10-CM | POA: Diagnosis not present

## 2016-03-29 DIAGNOSIS — K219 Gastro-esophageal reflux disease without esophagitis: Secondary | ICD-10-CM | POA: Diagnosis present

## 2016-03-29 DIAGNOSIS — S0083XA Contusion of other part of head, initial encounter: Secondary | ICD-10-CM | POA: Diagnosis not present

## 2016-03-29 DIAGNOSIS — F319 Bipolar disorder, unspecified: Secondary | ICD-10-CM | POA: Diagnosis present

## 2016-03-29 DIAGNOSIS — G9341 Metabolic encephalopathy: Secondary | ICD-10-CM | POA: Diagnosis not present

## 2016-03-29 DIAGNOSIS — W109XXA Fall (on) (from) unspecified stairs and steps, initial encounter: Secondary | ICD-10-CM | POA: Diagnosis not present

## 2016-03-29 DIAGNOSIS — W108XXA Fall (on) (from) other stairs and steps, initial encounter: Secondary | ICD-10-CM | POA: Diagnosis not present

## 2016-03-29 DIAGNOSIS — F0781 Postconcussional syndrome: Secondary | ICD-10-CM | POA: Diagnosis not present

## 2016-03-29 DIAGNOSIS — T424X5A Adverse effect of benzodiazepines, initial encounter: Secondary | ICD-10-CM | POA: Diagnosis not present

## 2016-03-29 DIAGNOSIS — T481X5A Adverse effect of skeletal muscle relaxants [neuromuscular blocking agents], initial encounter: Secondary | ICD-10-CM | POA: Diagnosis present

## 2016-03-29 DIAGNOSIS — Z886 Allergy status to analgesic agent status: Secondary | ICD-10-CM | POA: Diagnosis not present

## 2016-04-04 ENCOUNTER — Telehealth: Payer: Self-pay | Admitting: Family Medicine

## 2016-04-04 NOTE — Telephone Encounter (Signed)
Pt has been scheduled.  °

## 2016-04-04 NOTE — Telephone Encounter (Signed)
Pt would need an appt.

## 2016-04-04 NOTE — Telephone Encounter (Signed)
Pt states that she fell down 6 stairs over the weekend and hit her head, pt went to ER in [place], pt states that ER said she has a server UTI, Pt states that she was not given anything for it. Pt asking if an office visit is needed or can she just come and have labs to have her levels checked.

## 2016-04-05 ENCOUNTER — Encounter: Payer: Self-pay | Admitting: Family Medicine

## 2016-04-05 ENCOUNTER — Ambulatory Visit (INDEPENDENT_AMBULATORY_CARE_PROVIDER_SITE_OTHER): Payer: Medicare Other | Admitting: Family Medicine

## 2016-04-05 ENCOUNTER — Encounter: Payer: Self-pay | Admitting: General Practice

## 2016-04-05 VITALS — BP 136/86 | HR 82 | Temp 98.1°F | Resp 16 | Ht 62.0 in | Wt 358.1 lb

## 2016-04-05 DIAGNOSIS — G9341 Metabolic encephalopathy: Secondary | ICD-10-CM

## 2016-04-05 DIAGNOSIS — S060X0D Concussion without loss of consciousness, subsequent encounter: Secondary | ICD-10-CM | POA: Diagnosis not present

## 2016-04-05 LAB — CK: Total CK: 61 U/L (ref 7–177)

## 2016-04-05 LAB — HEPATIC FUNCTION PANEL
ALK PHOS: 109 U/L (ref 39–117)
ALT: 10 U/L (ref 0–35)
AST: 14 U/L (ref 0–37)
Albumin: 3.2 g/dL — ABNORMAL LOW (ref 3.5–5.2)
BILIRUBIN DIRECT: 0.1 mg/dL (ref 0.0–0.3)
BILIRUBIN TOTAL: 0.3 mg/dL (ref 0.2–1.2)
Total Protein: 5.7 g/dL — ABNORMAL LOW (ref 6.0–8.3)

## 2016-04-05 LAB — CBC WITH DIFFERENTIAL/PLATELET
BASOS PCT: 0.6 % (ref 0.0–3.0)
Basophils Absolute: 0 10*3/uL (ref 0.0–0.1)
EOS ABS: 0.4 10*3/uL (ref 0.0–0.7)
EOS PCT: 6.8 % — AB (ref 0.0–5.0)
HCT: 29.8 % — ABNORMAL LOW (ref 36.0–46.0)
HEMOGLOBIN: 9.1 g/dL — AB (ref 12.0–15.0)
LYMPHS ABS: 1.7 10*3/uL (ref 0.7–4.0)
Lymphocytes Relative: 27.7 % (ref 12.0–46.0)
MCHC: 30.5 g/dL (ref 30.0–36.0)
MCV: 77.8 fl — ABNORMAL LOW (ref 78.0–100.0)
MONO ABS: 0.6 10*3/uL (ref 0.1–1.0)
Monocytes Relative: 9.5 % (ref 3.0–12.0)
NEUTROS ABS: 3.4 10*3/uL (ref 1.4–7.7)
Neutrophils Relative %: 55.4 % (ref 43.0–77.0)
PLATELETS: 388 10*3/uL (ref 150.0–400.0)
RBC: 3.83 Mil/uL — ABNORMAL LOW (ref 3.87–5.11)
RDW: 18.3 % — AB (ref 11.5–15.5)
WBC: 6.1 10*3/uL (ref 4.0–10.5)

## 2016-04-05 LAB — POCT URINALYSIS DIPSTICK
BILIRUBIN UA: NEGATIVE
GLUCOSE UA: NEGATIVE
Ketones, UA: NEGATIVE
Leukocytes, UA: NEGATIVE
NITRITE UA: NEGATIVE
Protein, UA: 15
RBC UA: NEGATIVE
UROBILINOGEN UA: 1
pH, UA: 6

## 2016-04-05 LAB — BASIC METABOLIC PANEL
BUN: 7 mg/dL (ref 6–23)
CHLORIDE: 103 meq/L (ref 96–112)
CO2: 29 mEq/L (ref 19–32)
Calcium: 8.6 mg/dL (ref 8.4–10.5)
Creatinine, Ser: 0.57 mg/dL (ref 0.40–1.20)
GFR: 121.19 mL/min (ref >60.00)
Glucose, Bld: 91 mg/dL (ref 70–99)
POTASSIUM: 3.9 meq/L (ref 3.5–5.1)
SODIUM: 140 meq/L (ref 135–145)

## 2016-04-05 NOTE — Patient Instructions (Signed)
Follow up as needed or as scheduled We'll notify you of your lab results and make any changes if needed Alternate heat and ice on your bruises for pain relief Call with any questions or concerns- particularly if anything changes Happy Thanksgiving!!!!

## 2016-04-05 NOTE — Progress Notes (Signed)
Pre visit review using our clinic review tool, if applicable. No additional management support is needed unless otherwise documented below in the visit note. 

## 2016-04-05 NOTE — Progress Notes (Signed)
   Subjective:    Patient ID: [name], female    DOB: 1971-03-16, 45 y.o.   MRN: [id number]  [facility] f/u- pt was admitted 11/14-17 at [facility] after falling down 6 stairs due to acute metabolic encephalopathy.  Pt reports she just lost her balance and rolled down the stairs.  While in the ER she became very agitated and was pulling at clothes and blankets.  Temp increased to 102.2 from 99.3 while in ER.  ER 'kept telling me that I took too much medicine'.  Initial UA was negative for infection.  Was started on Vanc and Zosyn for possible sepsis but pt was not d/c'd on abx.  Pt denies current urinary sxs.  H&P and D/C summaries do not mention source of infxn nor d/c dx.  No d/c labs or imaging provided.  Pt states 'I feel bruised and sore... But getting better'.  No fevers or chills, N/V, HAs, visual changes, dizziness.   Review of Systems For ROS see HPI     Objective:   Physical Exam  Constitutional: She is oriented to person, place, and time. She appears well-developed and well-nourished. No distress.  obese  HENT:  Head: Normocephalic and atraumatic.  Eyes: Conjunctivae and EOM are normal. Pupils are equal, round, and reactive to light.  Neck: Normal range of motion. Neck supple. No thyromegaly present.  Cardiovascular: Normal rate, regular rhythm, normal heart sounds and intact distal pulses.   No murmur heard. Pulmonary/Chest: Effort normal and breath sounds normal. No respiratory distress.  Abdominal: Soft. She exhibits no distension. There is no tenderness.  Musculoskeletal: She exhibits no edema.  Lymphadenopathy:    She has no cervical adenopathy.  Neurological: She is alert and oriented to person, place, and time.  Skin: Skin is warm and dry.  Extensive bruising on arms, back, trunk, legs  Psychiatric: She has a normal mood and affect. Her behavior is normal.  Vitals reviewed.         Assessment & Plan:  Encephalopathy- pt was dx'd w/  metabolic encephalopathy during her recent hospitalization.  Unclear how they arrived at this dx as d/c summary is not complete (although both d/c summary and H&P reviewed).  Pt reports feeling better since her hospitalization w/ exception of being 'bruised and sore'.  Will repeat UA as they were not clear whether pt had UTI at time of hospitalization.  Will also check labs to assess for metabolic abnormalities.  Will hold on med changes at this time pending lab results  Concussion- new.  Reviewed supportive care and red flags that should prompt return.  Pt expressed understanding and is in agreement w/ plan.

## 2016-04-06 ENCOUNTER — Telehealth: Payer: Self-pay | Admitting: Family Medicine

## 2016-04-06 NOTE — Telephone Encounter (Signed)
Reviewed in PCP absence  Discussed results with patient. Overall look good. Anemia slightly declined from check 3 weeks ago. Is about the same as check 4 weeks prior. + history of ulcers. Denies any blood in stool, abdominal pain or etc today. Is taking medications as directed. Discussed will have her PCP review and determine follow-up CBC.

## 2016-04-06 NOTE — Telephone Encounter (Signed)
Please advise of lab results in [place] absence

## 2016-04-06 NOTE — Telephone Encounter (Signed)
Pt calling for lab results. Hoping to know the results before the holiday.

## 2016-04-10 ENCOUNTER — Encounter: Payer: Self-pay | Admitting: Family Medicine

## 2016-04-11 ENCOUNTER — Other Ambulatory Visit: Payer: Self-pay | Admitting: Family Medicine

## 2016-04-11 DIAGNOSIS — D62 Acute posthemorrhagic anemia: Secondary | ICD-10-CM

## 2016-04-11 MED ORDER — OXYCODONE-ACETAMINOPHEN 7.5-325 MG PO TABS: 1.0000 | ORAL_TABLET | Freq: Three times a day (TID) | ORAL | 0 refills | Status: DC | PRN

## 2016-04-11 NOTE — Telephone Encounter (Signed)
Last OV 04/05/16 Oxycodone last filled 03/14/16 #90 with 0

## 2016-04-11 NOTE — Telephone Encounter (Signed)
Patient calling to request refill of oxyCODONE-acetaminophen (PERCOCET) 7.5-325 MG tablet.

## 2016-04-11 NOTE — Telephone Encounter (Signed)
Pt made aware rx available at front desk for pick up.   

## 2016-04-19 ENCOUNTER — Telehealth: Payer: Self-pay | Admitting: Family Medicine

## 2016-04-19 ENCOUNTER — Other Ambulatory Visit: Payer: Medicare Other

## 2016-04-19 NOTE — Telephone Encounter (Signed)
I would, just inform her that it was cancelled.

## 2016-04-19 NOTE — Telephone Encounter (Signed)
Pt states that she has an appt in [facility] tomorrow and they will be doing labs,CBC, A1c,white blood cell count, pt asking if KT would be ok with these results and pt could cancel f/u lab appt with [place] on 12/11.

## 2016-04-19 NOTE — Telephone Encounter (Signed)
Called pt to let her know that KT was fine with this and canceled lab appt for 12/11.

## 2016-04-19 NOTE — Telephone Encounter (Signed)
Ok to cancel lab appt.

## 2016-04-19 NOTE — Telephone Encounter (Signed)
Do I need to call pt and make her aware as well?

## 2016-04-19 NOTE — Telephone Encounter (Signed)
Absolutely.

## 2016-04-25 ENCOUNTER — Other Ambulatory Visit: Payer: Medicare Other

## 2016-04-26 ENCOUNTER — Other Ambulatory Visit: Payer: Self-pay | Admitting: Family Medicine

## 2016-04-28 ENCOUNTER — Other Ambulatory Visit (INDEPENDENT_AMBULATORY_CARE_PROVIDER_SITE_OTHER): Payer: Medicare Other

## 2016-04-28 DIAGNOSIS — D62 Acute posthemorrhagic anemia: Secondary | ICD-10-CM | POA: Diagnosis not present

## 2016-04-29 LAB — CBC WITH DIFFERENTIAL/PLATELET
BASOS PCT: 0.3 % (ref 0.0–3.0)
Basophils Absolute: 0 10*3/uL (ref 0.0–0.1)
EOS ABS: 0.4 10*3/uL (ref 0.0–0.7)
EOS PCT: 5.1 % — AB (ref 0.0–5.0)
HEMATOCRIT: 32.6 % — AB (ref 36.0–46.0)
Hemoglobin: 10.1 g/dL — ABNORMAL LOW (ref 12.0–15.0)
Lymphocytes Relative: 23.7 % (ref 12.0–46.0)
Lymphs Abs: 1.9 10*3/uL (ref 0.7–4.0)
MCHC: 30.8 g/dL (ref 30.0–36.0)
MCV: 77.5 fl — ABNORMAL LOW (ref 78.0–100.0)
MONO ABS: 0.8 10*3/uL (ref 0.1–1.0)
Monocytes Relative: 9.7 % (ref 3.0–12.0)
NEUTROS ABS: 5 10*3/uL (ref 1.4–7.7)
Neutrophils Relative %: 61.2 % (ref 43.0–77.0)
PLATELETS: 311 10*3/uL (ref 150.0–400.0)
RBC: 4.21 Mil/uL (ref 3.87–5.11)
RDW: 19.6 % — AB (ref 11.5–15.5)
WBC: 8.1 10*3/uL (ref 4.0–10.5)

## 2016-05-03 ENCOUNTER — Other Ambulatory Visit: Payer: Self-pay | Admitting: Family Medicine

## 2016-05-03 MED ORDER — OXYCODONE-ACETAMINOPHEN 7.5-325 MG PO TABS: 1.0000 | ORAL_TABLET | Freq: Three times a day (TID) | ORAL | 0 refills | Status: DC | PRN

## 2016-05-03 NOTE — Telephone Encounter (Signed)
Pt needs refill on PERCOCET before the holidays, pt states she will p/u on Friday.

## 2016-05-03 NOTE — Telephone Encounter (Signed)
Last OV 04/05/16 Oxycodone last filled 04/11/16 #90 with 0  CSC, moderate risk

## 2016-05-03 NOTE — Telephone Encounter (Signed)
Pt daughter made aware rx available for pick up.

## 2016-05-06 ENCOUNTER — Other Ambulatory Visit: Payer: Medicare Other

## 2016-05-06 DIAGNOSIS — F39 Unspecified mood [affective] disorder: Secondary | ICD-10-CM | POA: Diagnosis not present

## 2016-05-06 DIAGNOSIS — F518 Other sleep disorders not due to a substance or known physiological condition: Secondary | ICD-10-CM | POA: Diagnosis not present

## 2016-05-11 ENCOUNTER — Other Ambulatory Visit: Payer: Medicare Other

## 2016-05-11 ENCOUNTER — Other Ambulatory Visit: Payer: Self-pay | Admitting: Family Medicine

## 2016-05-12 NOTE — Telephone Encounter (Signed)
[facility] The pharmacist advised that they received refill on 03/16/16 #30 with 3 refills. They will fill medication for pt.

## 2016-05-12 NOTE — Telephone Encounter (Signed)
Patient is asking you to call her on the clonazePAM (KLONOPIN) 1 MG tablet [id number]  . She states that neither pharmacy is showing refills. She states that [facility]

## 2016-05-13 ENCOUNTER — Ambulatory Visit
Admission: RE | Admit: 2016-05-13 | Discharge: 2016-05-13 | Disposition: A | Discharge status: Home / Self Care | Payer: Medicare Other | Source: Ambulatory Visit | Attending: Family Medicine | Admitting: Family Medicine

## 2016-05-13 ENCOUNTER — Ambulatory Visit (INDEPENDENT_AMBULATORY_CARE_PROVIDER_SITE_OTHER): Payer: Medicare Other | Admitting: Family Medicine

## 2016-05-13 ENCOUNTER — Encounter: Payer: Self-pay | Admitting: Family Medicine

## 2016-05-13 VITALS — BP 131/88 | HR 90 | Temp 98.1°F | Resp 17 | Ht 62.0 in | Wt 342.5 lb

## 2016-05-13 DIAGNOSIS — N63 Unspecified lump in unspecified breast: Secondary | ICD-10-CM

## 2016-05-13 DIAGNOSIS — M79604 Pain in right leg: Secondary | ICD-10-CM

## 2016-05-13 DIAGNOSIS — M79605 Pain in left leg: Secondary | ICD-10-CM

## 2016-05-13 DIAGNOSIS — N6489 Other specified disorders of breast: Secondary | ICD-10-CM | POA: Diagnosis not present

## 2016-05-13 DIAGNOSIS — R928 Other abnormal and inconclusive findings on diagnostic imaging of breast: Secondary | ICD-10-CM | POA: Diagnosis not present

## 2016-05-13 MED ORDER — PREDNISONE 10 MG PO TABS: ORAL_TABLET | ORAL | 0 refills | Status: DC

## 2016-05-13 NOTE — Assessment & Plan Note (Signed)
Recurrent issue but worse since her fall last month.  Pt has burning pain on R- consistent w/ nerve pain and throbbing pain on L.  No relief w/ narcotics.  Not able to take NSAIDs due to recent GI bleed.  Start pred taper.  Refer to Ortho.  Reviewed supportive care and red flags that should prompt return.  Pt expressed understanding and is in agreement w/ plan.

## 2016-05-13 NOTE — Progress Notes (Signed)
   Subjective:    Patient ID: [name], female    DOB: August 01, 1970, 45 y.o.   MRN: [id number]  HPI Leg pain- pt fell down the stairs in November and since then, has had constant L leg pain from hip to toes and intermittent R leg pain from hip to knee.  L leg is continuously throbbing and R leg feels like 'needles'.  Pt denies back pain.  R leg pain improves w/ elevation but no improvement on L.  Only mild improvement w/ pain meds.  Pt has seen Dr Alfonso Ramus in the past.  sxs have not improved at all in the month since the fall.  Pt is not able to ambulate w/ severe pain, 'i can't sit in 1 spot my whole life'   Review of Systems For ROS see HPI     Objective:   Physical Exam  Constitutional: She is oriented to person, place, and time. She appears well-developed and well-nourished. No distress.  Morbidly obese  Cardiovascular: Intact distal pulses.   Musculoskeletal: She exhibits edema (non pitting edema bilaterally) and tenderness (diffuse TTP over both LEs). She exhibits no deformity.  PE severely limited by body habitus  Neurological: She is alert and oriented to person, place, and time.  Skin: Skin is warm and dry. There is erythema (mild erythema of lower legs bilaterally).  Psychiatric: She has a normal mood and affect. Her behavior is normal. Thought content normal.  Vitals reviewed.         Assessment & Plan:

## 2016-05-13 NOTE — Progress Notes (Signed)
Pre visit review using our clinic review tool, if applicable. No additional management support is needed unless otherwise documented below in the visit note. 

## 2016-05-13 NOTE — Patient Instructions (Signed)
Follow up as needed We'll call you with your Ortho appt Start the Prednisone as directed- take w/ food Add Tylenol as needed for additional pain Alternate ice/heat for pain relief Call with any questions or concerns Hang in there!! Happy New Year!

## 2016-05-17 DIAGNOSIS — M25551 Pain in right hip: Secondary | ICD-10-CM | POA: Diagnosis not present

## 2016-05-17 DIAGNOSIS — M170 Bilateral primary osteoarthritis of knee: Secondary | ICD-10-CM | POA: Diagnosis not present

## 2016-05-17 DIAGNOSIS — M25552 Pain in left hip: Secondary | ICD-10-CM | POA: Diagnosis not present

## 2016-05-17 DIAGNOSIS — S335XXA Sprain of ligaments of lumbar spine, initial encounter: Secondary | ICD-10-CM | POA: Diagnosis not present

## 2016-05-18 ENCOUNTER — Telehealth: Payer: Self-pay | Admitting: Family Medicine

## 2016-05-18 NOTE — Telephone Encounter (Signed)
Can you look up pt in database and see when it was last filled.

## 2016-05-18 NOTE — Telephone Encounter (Signed)
Miranda with [facility] calling about pt [name] states that she spoke to old pharmacy and they stated that pt had already gotten all refill. Caller wanted to verify with you.

## 2016-05-20 MED ORDER — CLONAZEPAM 1 MG PO TABS: ORAL_TABLET | ORAL | 3 refills | Status: DC

## 2016-05-20 NOTE — Telephone Encounter (Signed)
Pt states that Rx be sent to [facility].

## 2016-05-20 NOTE — Telephone Encounter (Signed)
LMOVM advising patient to call back with which pharmacy she would like her Klonopin to be sent to. It is important that she only use one pharmacy for her controlled medications.

## 2016-05-20 NOTE — Telephone Encounter (Signed)
Noted, med filled per PCP.

## 2016-05-20 NOTE — Telephone Encounter (Signed)
Pt last had alprazolam filled 12/19.  This was from a script written 10/25.  I don't see where Klonopin has been filled recently (last 02/05/16).  Not sure if things have been filled in [place] or not as I can only see [place] pharmacies

## 2016-05-30 ENCOUNTER — Other Ambulatory Visit: Payer: Self-pay | Admitting: Family Medicine

## 2016-05-31 NOTE — Telephone Encounter (Signed)
Last OV 05/13/16 Alprazolam last filled 03/09/16 #90 with 1

## 2016-06-06 ENCOUNTER — Telehealth: Payer: Self-pay | Admitting: General Practice

## 2016-06-06 NOTE — Telephone Encounter (Signed)
Last OV 05/13/16 Oxycodone last filled 05/03/16 #90 with 0

## 2016-06-07 ENCOUNTER — Ambulatory Visit (INDEPENDENT_AMBULATORY_CARE_PROVIDER_SITE_OTHER): Payer: Medicare Other | Admitting: Physician Assistant

## 2016-06-07 ENCOUNTER — Encounter: Payer: Self-pay | Admitting: Physician Assistant

## 2016-06-07 VITALS — BP 112/80 | HR 76 | Temp 97.9°F | Resp 16 | Ht 62.0 in | Wt 317.0 lb

## 2016-06-07 DIAGNOSIS — R102 Pelvic and perineal pain: Secondary | ICD-10-CM

## 2016-06-07 LAB — POCT URINALYSIS DIPSTICK
BILIRUBIN UA: NEGATIVE
GLUCOSE UA: NEGATIVE
Ketones, UA: NEGATIVE
Leukocytes, UA: NEGATIVE
NITRITE UA: NEGATIVE
Protein, UA: NEGATIVE
RBC UA: NEGATIVE
SPEC GRAV UA: 1.015
Urobilinogen, UA: 0.2
pH, UA: 5.5

## 2016-06-07 MED ORDER — OXYCODONE-ACETAMINOPHEN 7.5-325 MG PO TABS: 1.0000 | ORAL_TABLET | Freq: Three times a day (TID) | ORAL | 0 refills | Status: DC | PRN

## 2016-06-07 MED ORDER — CIPROFLOXACIN HCL 250 MG PO TABS: 250.0000 mg | ORAL_TABLET | Freq: Two times a day (BID) | ORAL | 0 refills | Status: DC

## 2016-06-07 NOTE — Progress Notes (Signed)
Patient presents to clinic today c/o 4-5 days of suprapubic pressure, camping pain. Endorses some lower back pain that has resolved. Denies urinary frequency, dysuria, hematuria. Endorses some nausea but denies emesis. Notes urinary urgency. Denies fever, chills, flank pain. Denies vaginal pain, pressure, discharge. Patient is s/p hysterectomy in 1998. Has R ovary but notes L oophorectomy. Endorses prior history of UTI. States they always start this way before true urinary symptoms begin.   Past Medical History:  Diagnosis Date  . Allergic rhinitis   . Ankle fracture, right   . Anxiety   . Carpal tunnel syndrome   . Cervical cancer ([place]) 1998  . Fibromyalgia   . Hyperlipidemia   . Hypertension   . Migraine headache   . Obesity   . Sleep apnea     Current Outpatient Prescriptions on File Prior to Visit  Medication Sig Dispense Refill  . ALPRAZolam (XANAX) 0.5 MG tablet TAKE 1 TABLET BY MOUTH 3 TIMES DAILY AS NEEDED FOR ANXIETY OR SLEEP 90 tablet 1  . ARIPiprazole (ABILIFY) 10 MG tablet Take 10 mg by mouth daily.    . Armodafinil 250 MG tablet Take 250 mg by mouth 2 (two) times daily.     Marland Kitchen BIOTIN PO Take 1 tablet by mouth daily.    Marland Kitchen BYSTOLIC 10 MG tablet TAKE 1 TABLET BY MOUTH EVERY DAY 90 tablet 1  . calcium citrate (CALCITRATE - DOSED IN MG ELEMENTAL CALCIUM) 950 MG tablet Take 1 tablet by mouth daily.    . clonazePAM (KLONOPIN) 1 MG tablet TAKE 1 TABLET BY MOUTH AT BEDTIME AS NEEDED FOR RESTLESS LEG SYNDROME 30 tablet 3  . cyclobenzaprine (FLEXERIL) 10 MG tablet TAKE 1 TABLET BY MOUTH 3 TIMES DAILY AS NEEDED 270 tablet 1  . DULoxetine (CYMBALTA) 60 MG capsule Take 2 capsules (120 mg total) by mouth daily. 240 capsule 2  . ferrous sulfate 325 (65 FE) MG tablet Take 1 tablet (325 mg total) by mouth daily with breakfast. 30 tablet 6  . furosemide (LASIX) 40 MG tablet Take 1.5 tablets (60 mg total) by mouth daily. (Patient taking differently: Take 80 mg by mouth daily. ) 135 tablet 0    . gabapentin (NEURONTIN) 100 MG capsule Take 200-300 mg by mouth 3 (three) times daily as needed (for pain).    . hydroxychloroquine (PLAQUENIL) 200 MG tablet Take 200 mg by mouth 2 (two) times daily.    Marland Kitchen lamoTRIgine (LAMICTAL) 200 MG tablet Take 200 mg by mouth 2 (two) times daily.     . mupirocin cream (BACTROBAN) 2 % Apply 1 application topically 2 (two) times daily. 30 g 0  . mupirocin ointment (BACTROBAN) 2 % Place 1 application into the nose 2 (two) times daily. 22 g 0  . mycophenolate (CELLCEPT) 500 MG tablet Take 1,500 mg by mouth 2 (two) times daily.    . mycophenolate (CELLCEPT) 500 MG tablet TAKE 3 TABLETS BY MOUTH 2 TIMES DAILY    . Needles & Syringes MISC Use one 26mL syringe and 25 G 1" needle each time B12 is injected. B12 needed once a month 24 each 0  . oxyCODONE-acetaminophen (PERCOCET) 7.5-325 MG tablet Take 1 tablet by mouth every 8 (eight) hours as needed for severe pain. 90 tablet 0  . pantoprazole (PROTONIX) 40 MG tablet Take 1 tablet (40 mg total) by mouth 2 (two) times daily. 60 tablet 3  . promethazine (PHENERGAN) 25 MG tablet TAKE 1 TABLET BY MOUTH EVERY 6 HOURS AS NEEDED FOR NAUSEA  AND VOMITING 45 tablet 3  . rosuvastatin (CRESTOR) 20 MG tablet TAKE 1 TABLET BY MOUTH EVERY DAY (Patient taking differently: Take 20 mg by mouth every day) 90 tablet 0  . sucralfate (CARAFATE) 1 g tablet Take 1 tablet (1 g total) by mouth 4 (four) times daily -  with meals and at bedtime. 90 tablet 0  . valACYclovir (VALTREX) 1000 MG tablet      No current facility-administered medications on file prior to visit.     Allergies  Allergen Reactions  . Niacin Other (See Comments)    REACTION: Swelling, problems breathing  . Sulfamethoxazole-Trimethoprim Other (See Comments)    REACTION: Rash, throat closed, eyes swelled.  . Aspirin Hives  . Bactrim Hives  . Benzoin Compound Rash  . Cephalexin Hives  . Clindamycin/Lincomycin Hives  . Doxycycline Hives  . Iohexol Hives    Pt treated  with PO benedryl  . Lisinopril Hives  . Naproxen Hives  . Pnu-Imune [Pneumococcal Polysaccharide Vaccine] Rash  . Sulfonamide Derivatives Rash    Family History  Problem Relation Age of Onset  . Diabetes Mother   . Emphysema Mother   . Allergies Mother   . Hypertension Mother   . Heart disease Father   . Allergies Father   . Prostate cancer Father   . Breast cancer Maternal Grandmother     Social History   Social History  . Marital status: Married    Spouse name: N/A  . Number of children: N/A  . Years of education: N/A   Occupational History  . disabled. prev worked as a Engineer, manufacturing systems History Main Topics  . Smoking status: Never Smoker  . Smokeless tobacco: Never Used  . Alcohol use No  . Drug use: No  . Sexual activity: Yes     Comment: TAH   Other Topics Concern  . None   Social History Narrative  . None    Review of Systems - See HPI.  All other ROS are negative.  BP 112/80   Pulse 76   Temp 97.9 F (36.6 C) (Oral)   Resp 16   Ht 5\' 2"  (1.575 m)   Wt (!) 317 lb (143.8 kg)   SpO2 97%   BMI 57.98 kg/m   Physical Exam  Constitutional: She is oriented to person, place, and time and well-developed, well-nourished, and in no distress.  HENT:  Head: Normocephalic and atraumatic.  Eyes: Conjunctivae are normal.  Cardiovascular: Normal rate, regular rhythm, normal heart sounds and intact distal pulses.   Pulmonary/Chest: Effort normal and breath sounds normal. No respiratory distress. She has no wheezes. She has no rales. She exhibits no tenderness.  Abdominal: Soft. Bowel sounds are normal. There is tenderness in the suprapubic area. There is no CVA tenderness.  Neurological: She is alert and oriented to person, place, and time.  Skin: Skin is warm and dry. No rash noted.  Psychiatric: Affect normal.  Vitals reviewed.   Recent Results (from the past 2160 hour(s))  CBC w/Diff     Status: Abnormal   Collection Time: 03/14/16 11:13 AM    Result Value Ref Range   WBC 9.3 4.0 - 10.5 K/uL   RBC 4.17 3.87 - 5.11 Mil/uL   Hemoglobin 9.9 (L) 12.0 - 15.0 g/dL   HCT 32.2 (L) 36.0 - 46.0 %   MCV 77.2 (L) 78.0 - 100.0 fl   MCHC 30.9 30.0 - 36.0 g/dL   RDW 17.5 (H) 11.5 - 15.5 %  Platelets 421.0 (H) 150.0 - 400.0 K/uL   Neutrophils Relative % 64.3 43.0 - 77.0 %   Lymphocytes Relative 24.0 12.0 - 46.0 %   Monocytes Relative 8.9 3.0 - 12.0 %   Eosinophils Relative 2.2 0.0 - 5.0 %   Basophils Relative 0.6 0.0 - 3.0 %   Neutro Abs 6.0 1.4 - 7.7 K/uL   Lymphs Abs 2.2 0.7 - 4.0 K/uL   Monocytes Absolute 0.8 0.1 - 1.0 K/uL   Eosinophils Absolute 0.2 0.0 - 0.7 K/uL   Basophils Absolute 0.1 0.0 - 0.1 K/uL  Basic metabolic panel     Status: None   Collection Time: 04/05/16 11:17 AM  Result Value Ref Range   Sodium 140 135 - 145 mEq/L   Potassium 3.9 3.5 - 5.1 mEq/L   Chloride 103 96 - 112 mEq/L   CO2 29 19 - 32 mEq/L   Glucose, Bld 91 70 - 99 mg/dL   BUN 7 6 - 23 mg/dL   Creatinine, Ser 0.57 0.40 - 1.20 mg/dL   Calcium 8.6 8.4 - 10.5 mg/dL   GFR 121.19 >60.00 mL/min  Hepatic function panel     Status: Abnormal   Collection Time: 04/05/16 11:17 AM  Result Value Ref Range   Total Bilirubin 0.3 0.2 - 1.2 mg/dL   Bilirubin, Direct 0.1 0.0 - 0.3 mg/dL   Alkaline Phosphatase 109 39 - 117 U/L   AST 14 0 - 37 U/L   ALT 10 0 - 35 U/L   Total Protein 5.7 (L) 6.0 - 8.3 g/dL   Albumin 3.2 (L) 3.5 - 5.2 g/dL  CBC with Differential/Platelet     Status: Abnormal   Collection Time: 04/05/16 11:17 AM  Result Value Ref Range   WBC 6.1 4.0 - 10.5 K/uL   RBC 3.83 (L) 3.87 - 5.11 Mil/uL   Hemoglobin 9.1 (L) 12.0 - 15.0 g/dL   HCT 29.8 (L) 36.0 - 46.0 %   MCV 77.8 (L) 78.0 - 100.0 fl   MCHC 30.5 30.0 - 36.0 g/dL   RDW 18.3 (H) 11.5 - 15.5 %   Platelets 388.0 150.0 - 400.0 K/uL   Neutrophils Relative % 55.4 43.0 - 77.0 %   Lymphocytes Relative 27.7 12.0 - 46.0 %   Monocytes Relative 9.5 3.0 - 12.0 %   Eosinophils Relative 6.8 (H) 0.0 -  5.0 %   Basophils Relative 0.6 0.0 - 3.0 %   Neutro Abs 3.4 1.4 - 7.7 K/uL   Lymphs Abs 1.7 0.7 - 4.0 K/uL   Monocytes Absolute 0.6 0.1 - 1.0 K/uL   Eosinophils Absolute 0.4 0.0 - 0.7 K/uL   Basophils Absolute 0.0 0.0 - 0.1 K/uL  CK (Creatine Kinase)     Status: None   Collection Time: 04/05/16 11:17 AM  Result Value Ref Range   Total CK 61 7 - 177 U/L  POCT urinalysis dipstick     Status: Abnormal   Collection Time: 04/05/16 11:29 AM  Result Value Ref Range   Color, UA Amber    Clarity, UA Cloudy    Glucose, UA Negative    Bilirubin, UA Negative    Ketones, UA Negative    Spec Grav, UA >=1.030    Blood, UA Negative    pH, UA 6.0    Protein, UA 15    Urobilinogen, UA 1.0    Nitrite, UA Negative    Leukocytes, UA Negative Negative  CBC with Differential/Platelet     Status: Abnormal   Collection Time:  04/28/16  2:25 PM  Result Value Ref Range   WBC 8.1 4.0 - 10.5 K/uL   RBC 4.21 3.87 - 5.11 Mil/uL   Hemoglobin 10.1 (L) 12.0 - 15.0 g/dL   HCT 32.6 (L) 36.0 - 46.0 %   MCV 77.5 (L) 78.0 - 100.0 fl   MCHC 30.8 30.0 - 36.0 g/dL   RDW 19.6 (H) 11.5 - 15.5 %   Platelets 311.0 150.0 - 400.0 K/uL   Neutrophils Relative % 61.2 43.0 - 77.0 %   Lymphocytes Relative 23.7 12.0 - 46.0 %   Monocytes Relative 9.7 3.0 - 12.0 %   Eosinophils Relative 5.1 (H) 0.0 - 5.0 %   Basophils Relative 0.3 0.0 - 3.0 %   Neutro Abs 5.0 1.4 - 7.7 K/uL   Lymphs Abs 1.9 0.7 - 4.0 K/uL   Monocytes Absolute 0.8 0.1 - 1.0 K/uL   Eosinophils Absolute 0.4 0.0 - 0.7 K/uL   Basophils Absolute 0.0 0.0 - 0.1 K/uL  POCT Urinalysis Dipstick     Status: Normal   Collection Time: 06/07/16 11:07 AM  Result Value Ref Range   Color, UA yellow    Clarity, UA clear    Glucose, UA negative    Bilirubin, UA negative    Ketones, UA negative    Spec Grav, UA 1.015    Blood, UA negative    pH, UA 5.5    Protein, UA negative    Urobilinogen, UA 0.2    Nitrite, UA negative    Leukocytes, UA Negative Negative     Assessment/Plan: 1. Suprapubic pain With urinary frequency. Urine dip unremarkable. Will send for culture. Patient with significant history of UTI, stating they always start this way. Will give Cipro 250 to take BID for 3 days while culture results. Supportive measures and OTC medications reviewed. Will alter treatment based on culture results.   - POCT Urinalysis Dipstick - ciprofloxacin (CIPRO) 250 MG tablet; Take 1 tablet (250 mg total) by mouth 2 (two) times daily.  Dispense: 6 tablet; Refill: 0 - CULTURE, URINE COMPREHENSIVE   Leeanne Rio, PA-C

## 2016-06-07 NOTE — Telephone Encounter (Signed)
Indication for chronic opioid: back/knee pain Medication and dose: Oxycodone 7.5/325mg  # pills per month: 90 Last UDS date:   Pain contract signed (Y/N): Y Date narcotic database last reviewed (include red flags): 06/07/16- no red flags   OK FOR REFILL, #90, no refill

## 2016-06-07 NOTE — Telephone Encounter (Signed)
Med filled and placed at front desk for pt.

## 2016-06-07 NOTE — Progress Notes (Signed)
Pre visit review using our clinic review tool, if applicable. No additional management support is needed unless otherwise documented below in the visit note. 

## 2016-06-07 NOTE — Patient Instructions (Signed)
Please start Cipro as directed until Culture results. Avoid caffeine. Increase hydration. AZO for bladder pain. Continue chronic medications. We will alter regimen based on results.

## 2016-06-09 DIAGNOSIS — Z888 Allergy status to other drugs, medicaments and biological substances status: Secondary | ICD-10-CM | POA: Diagnosis not present

## 2016-06-09 DIAGNOSIS — Z90710 Acquired absence of both cervix and uterus: Secondary | ICD-10-CM | POA: Diagnosis not present

## 2016-06-09 DIAGNOSIS — M797 Fibromyalgia: Secondary | ICD-10-CM | POA: Diagnosis not present

## 2016-06-09 DIAGNOSIS — F329 Major depressive disorder, single episode, unspecified: Secondary | ICD-10-CM | POA: Diagnosis not present

## 2016-06-09 DIAGNOSIS — K219 Gastro-esophageal reflux disease without esophagitis: Secondary | ICD-10-CM | POA: Diagnosis not present

## 2016-06-09 DIAGNOSIS — F419 Anxiety disorder, unspecified: Secondary | ICD-10-CM | POA: Diagnosis not present

## 2016-06-09 DIAGNOSIS — Z7982 Long term (current) use of aspirin: Secondary | ICD-10-CM | POA: Diagnosis not present

## 2016-06-09 DIAGNOSIS — Z9049 Acquired absence of other specified parts of digestive tract: Secondary | ICD-10-CM | POA: Diagnosis not present

## 2016-06-09 DIAGNOSIS — E785 Hyperlipidemia, unspecified: Secondary | ICD-10-CM | POA: Diagnosis not present

## 2016-06-09 DIAGNOSIS — Z9884 Bariatric surgery status: Secondary | ICD-10-CM | POA: Diagnosis not present

## 2016-06-09 DIAGNOSIS — Z881 Allergy status to other antibiotic agents status: Secondary | ICD-10-CM | POA: Diagnosis not present

## 2016-06-09 DIAGNOSIS — Z887 Allergy status to serum and vaccine status: Secondary | ICD-10-CM | POA: Diagnosis not present

## 2016-06-09 DIAGNOSIS — Z886 Allergy status to analgesic agent status: Secondary | ICD-10-CM | POA: Diagnosis not present

## 2016-06-09 DIAGNOSIS — G629 Polyneuropathy, unspecified: Secondary | ICD-10-CM | POA: Diagnosis not present

## 2016-06-09 DIAGNOSIS — I10 Essential (primary) hypertension: Secondary | ICD-10-CM | POA: Diagnosis not present

## 2016-06-09 DIAGNOSIS — M300 Polyarteritis nodosa: Secondary | ICD-10-CM | POA: Diagnosis not present

## 2016-06-09 DIAGNOSIS — Z79899 Other long term (current) drug therapy: Secondary | ICD-10-CM | POA: Diagnosis not present

## 2016-06-09 DIAGNOSIS — Z91041 Radiographic dye allergy status: Secondary | ICD-10-CM | POA: Diagnosis not present

## 2016-06-09 DIAGNOSIS — Z882 Allergy status to sulfonamides status: Secondary | ICD-10-CM | POA: Diagnosis not present

## 2016-06-10 LAB — CULTURE, URINE COMPREHENSIVE

## 2016-06-21 ENCOUNTER — Encounter: Payer: Self-pay | Admitting: Family Medicine

## 2016-06-27 ENCOUNTER — Other Ambulatory Visit: Payer: Self-pay | Admitting: Family Medicine

## 2016-06-28 ENCOUNTER — Other Ambulatory Visit: Payer: Self-pay | Admitting: Family Medicine

## 2016-07-04 ENCOUNTER — Encounter: Payer: Self-pay | Admitting: Family Medicine

## 2016-07-04 MED ORDER — HYDROCODONE-ACETAMINOPHEN 7.5-325 MG PO TABS: 1.0000 | ORAL_TABLET | Freq: Four times a day (QID) | ORAL | 0 refills | Status: DC | PRN

## 2016-07-06 ENCOUNTER — Other Ambulatory Visit: Payer: Self-pay | Admitting: General Practice

## 2016-07-06 MED ORDER — PANTOPRAZOLE SODIUM 40 MG PO TBEC: 40.0000 mg | DELAYED_RELEASE_TABLET | Freq: Two times a day (BID) | ORAL | 1 refills | Status: DC

## 2016-07-08 ENCOUNTER — Encounter: Payer: Self-pay | Admitting: Family Medicine

## 2016-07-18 ENCOUNTER — Telehealth: Payer: Self-pay | Admitting: Family Medicine

## 2016-07-18 MED ORDER — MECLIZINE HCL 25 MG PO TABS
25.0000 mg | ORAL_TABLET | Freq: Three times a day (TID) | ORAL | 0 refills | Status: DC | PRN
Start: 2016-07-18 — End: 2021-06-10

## 2016-07-18 NOTE — Telephone Encounter (Signed)
IXL for Meclizine 25mg  TID prn dizziness, #30, no refills.  If symptoms persist or worsens will need visit.

## 2016-07-18 NOTE — Telephone Encounter (Signed)
Please advise 

## 2016-07-18 NOTE — Telephone Encounter (Signed)
Pt informed and medication filled to local pharmacy.

## 2016-07-18 NOTE — Telephone Encounter (Signed)
Patient states she experienced two episodes of vertigo this weekend.  She states she was by pcp before for this problem and wants to know if something can be called in to the pharmacy for her without having to come in for office visit.  Please advise.  Ph[facility]acy: [facility] [place] - [place], [place] - [address] [phone] (Phone) [phone] (Fax)

## 2016-07-25 ENCOUNTER — Other Ambulatory Visit: Payer: Self-pay | Admitting: Family Medicine

## 2016-07-25 NOTE — Telephone Encounter (Signed)
Last OV 04/05/16 Alprazolam last filled 05/31/16 #90 with 1

## 2016-07-30 ENCOUNTER — Encounter: Payer: Self-pay | Admitting: Family Medicine

## 2016-08-01 NOTE — Telephone Encounter (Signed)
Last OV 06/07/16 Hydrocodone last filled 07/04/16 #90 with 0

## 2016-08-03 MED ORDER — HYDROCODONE-ACETAMINOPHEN 7.5-325 MG PO TABS: 1.0000 | ORAL_TABLET | Freq: Four times a day (QID) | ORAL | 0 refills | Status: DC | PRN

## 2016-08-03 NOTE — Telephone Encounter (Signed)
Indication for chronic opioid: gout, joint pain, PAN Medication and dose: hydrocodone 7.5/325 # pills per month: 90 Last UDS date:  Pain contract signed (Y/N): Y Date narcotic database last reviewed (include red flags): reviewed 3/21- no red flags

## 2016-08-18 ENCOUNTER — Encounter: Payer: Self-pay | Admitting: Family Medicine

## 2016-08-22 ENCOUNTER — Other Ambulatory Visit: Payer: Self-pay | Admitting: Family Medicine

## 2016-08-22 NOTE — Telephone Encounter (Signed)
Last OV 06/07/16 Clonazepam last filled 05/20/16 #30 with 3

## 2016-08-22 NOTE — Telephone Encounter (Signed)
Medication filled to pharmacy as requested.   

## 2016-08-30 ENCOUNTER — Encounter: Payer: Self-pay | Admitting: Family Medicine

## 2016-08-30 ENCOUNTER — Other Ambulatory Visit: Payer: Self-pay | Admitting: General Practice

## 2016-08-30 ENCOUNTER — Other Ambulatory Visit: Payer: Self-pay | Admitting: Family Medicine

## 2016-08-30 DIAGNOSIS — F39 Unspecified mood [affective] disorder: Secondary | ICD-10-CM | POA: Diagnosis not present

## 2016-08-30 DIAGNOSIS — F518 Other sleep disorders not due to a substance or known physiological condition: Secondary | ICD-10-CM | POA: Diagnosis not present

## 2016-08-30 DIAGNOSIS — M300 Polyarteritis nodosa: Secondary | ICD-10-CM

## 2016-08-30 MED ORDER — HYDROCODONE-ACETAMINOPHEN 7.5-325 MG PO TABS: 1.0000 | ORAL_TABLET | Freq: Four times a day (QID) | ORAL | 0 refills | Status: DC | PRN

## 2016-08-31 MED ORDER — HYDROCODONE-ACETAMINOPHEN 10-325 MG PO TABS: 1.0000 | ORAL_TABLET | Freq: Four times a day (QID) | ORAL | 0 refills | Status: DC | PRN

## 2016-09-05 DIAGNOSIS — R229 Localized swelling, mass and lump, unspecified: Secondary | ICD-10-CM | POA: Diagnosis not present

## 2016-09-05 DIAGNOSIS — M797 Fibromyalgia: Secondary | ICD-10-CM | POA: Diagnosis not present

## 2016-09-05 DIAGNOSIS — L905 Scar conditions and fibrosis of skin: Secondary | ICD-10-CM | POA: Diagnosis not present

## 2016-09-05 DIAGNOSIS — M300 Polyarteritis nodosa: Secondary | ICD-10-CM | POA: Diagnosis not present

## 2016-09-05 DIAGNOSIS — Z79899 Other long term (current) drug therapy: Secondary | ICD-10-CM | POA: Diagnosis not present

## 2016-09-05 DIAGNOSIS — L539 Erythematous condition, unspecified: Secondary | ICD-10-CM | POA: Diagnosis not present

## 2016-09-05 DIAGNOSIS — L308 Other specified dermatitis: Secondary | ICD-10-CM | POA: Diagnosis not present

## 2016-09-05 DIAGNOSIS — L309 Dermatitis, unspecified: Secondary | ICD-10-CM | POA: Diagnosis not present

## 2016-09-05 DIAGNOSIS — I10 Essential (primary) hypertension: Secondary | ICD-10-CM | POA: Diagnosis not present

## 2016-09-07 ENCOUNTER — Encounter: Payer: Self-pay | Admitting: Family Medicine

## 2016-09-07 ENCOUNTER — Ambulatory Visit (INDEPENDENT_AMBULATORY_CARE_PROVIDER_SITE_OTHER): Payer: Medicare Other | Admitting: Family Medicine

## 2016-09-07 VITALS — BP 133/83 | HR 90 | Resp 17 | Ht 62.0 in | Wt 335.4 lb

## 2016-09-07 DIAGNOSIS — F1190 Opioid use, unspecified, uncomplicated: Secondary | ICD-10-CM | POA: Diagnosis not present

## 2016-09-07 DIAGNOSIS — M300 Polyarteritis nodosa: Secondary | ICD-10-CM | POA: Diagnosis not present

## 2016-09-07 NOTE — Assessment & Plan Note (Signed)
Pt takes regular Hydrocodone (this is more effective than Oxycodone) due to her chronic pain from PAN.  Has signed contract and UTD UDS.

## 2016-09-07 NOTE — Progress Notes (Signed)
   Subjective:    Patient ID: [name], female    DOB: Jan 07, 1971, 46 y.o.   MRN: [id number]  HPI Chronic pain- pt has known vasculitis and is being tx'd at [facility].  Currently on Cellcept but continues to have painful flares.  Pt reports some relief since increasing Hydrocodone to 10mg  every 6 hrs.  She is scared to switch to Cytoxan which is what [facility] is recommending.  Pt reports she is now able to get up and about with the new pain meds- this was not possible previously.  Pt goes back to [facility] May 22nd.   Review of Systems For ROS see HPI     Objective:   Physical Exam  Constitutional: She is oriented to person, place, and time. She appears well-developed and well-nourished. No distress.  Morbidly obese  HENT:  Head: Normocephalic and atraumatic.  Neurological: She is alert and oriented to person, place, and time.  Psychiatric: She has a normal mood and affect. Her behavior is normal. Thought content normal.  Vitals reviewed.         Assessment & Plan:

## 2016-09-07 NOTE — Patient Instructions (Signed)
Schedule your complete physical in 3-4 months Continue the Hydrocodone for pain Make sure you are getting up and about for your health and sanity Call with any questions or concerns Hang in there!!!

## 2016-09-07 NOTE — Assessment & Plan Note (Signed)
Chronic problem.  Following w/ [facility].  Continues to have severe pain during flares.  Recently had to increase Hydrocodone to 10mg  q6 prn.  This is doing a better job of controlling pain.  Will follow.

## 2016-09-07 NOTE — Progress Notes (Signed)
Pre visit review using our clinic review tool, if applicable. No additional management support is needed unless otherwise documented below in the visit note. 

## 2016-09-09 ENCOUNTER — Encounter: Payer: Self-pay | Admitting: Family Medicine

## 2016-09-15 ENCOUNTER — Ambulatory Visit: Payer: Medicare Other | Admitting: Family Medicine

## 2016-09-15 MED ORDER — FERROUS SULFATE 325 (65 FE) MG PO TABS: 325.0000 mg | ORAL_TABLET | Freq: Every day | ORAL | 6 refills | Status: DC

## 2016-09-19 ENCOUNTER — Other Ambulatory Visit: Payer: Self-pay | Admitting: Family Medicine

## 2016-09-20 ENCOUNTER — Other Ambulatory Visit: Payer: Self-pay | Admitting: Family Medicine

## 2016-09-20 NOTE — Telephone Encounter (Signed)
Last OV 09/07/16 Alprazolam last filled 07/25/16 #90 with 1

## 2016-09-20 NOTE — Telephone Encounter (Signed)
Medication filled to pharmacy as requested.   

## 2016-09-23 ENCOUNTER — Encounter: Payer: Self-pay | Admitting: Family Medicine

## 2016-09-25 ENCOUNTER — Encounter: Payer: Self-pay | Admitting: Family Medicine

## 2016-09-26 NOTE — Telephone Encounter (Signed)
Last OV 09/07/16 Clonazepam last filled 08/22/16 #30 with 3   Pt should not need these

## 2016-09-29 ENCOUNTER — Telehealth: Payer: Self-pay | Admitting: Family Medicine

## 2016-09-29 MED ORDER — HYDROCODONE-ACETAMINOPHEN 10-325 MG PO TABS: 1.0000 | ORAL_TABLET | Freq: Four times a day (QID) | ORAL | 0 refills | Status: DC | PRN

## 2016-09-29 NOTE — Telephone Encounter (Signed)
Med filled and pt made aware to pick up at front desk

## 2016-09-29 NOTE — Telephone Encounter (Signed)
Last OV 09/07/16 Hydrocodone last filled 08/31/16

## 2016-09-29 NOTE — Telephone Encounter (Signed)
Ok for 90

## 2016-09-29 NOTE — Telephone Encounter (Signed)
Pt requesting refill of HYDROcodone-acetaminophen (NORCO) 10-325 MG tablet.  States last refill she was only given 60 tablets but should have been given 90.  Please make sure refill is for 90 tablets.  She will be in the office tomorrow for an appt and would like to pick up rx for herself and her parents.

## 2016-09-30 ENCOUNTER — Ambulatory Visit (INDEPENDENT_AMBULATORY_CARE_PROVIDER_SITE_OTHER): Payer: Medicare Other | Admitting: Physician Assistant

## 2016-09-30 ENCOUNTER — Encounter: Payer: Self-pay | Admitting: Physician Assistant

## 2016-09-30 VITALS — BP 110/80 | HR 78 | Temp 97.8°F | Resp 16 | Ht 62.0 in | Wt 345.0 lb

## 2016-09-30 DIAGNOSIS — M300 Polyarteritis nodosa: Secondary | ICD-10-CM | POA: Diagnosis not present

## 2016-09-30 DIAGNOSIS — W57XXXA Bitten or stung by nonvenomous insect and other nonvenomous arthropods, initial encounter: Secondary | ICD-10-CM | POA: Diagnosis not present

## 2016-09-30 LAB — CBC WITH DIFFERENTIAL/PLATELET
BASOS ABS: 0.1 10*3/uL (ref 0.0–0.1)
Basophils Relative: 0.9 % (ref 0.0–3.0)
EOS ABS: 0.2 10*3/uL (ref 0.0–0.7)
Eosinophils Relative: 1.3 % (ref 0.0–5.0)
HEMATOCRIT: 37.3 % (ref 36.0–46.0)
Hemoglobin: 11.5 g/dL — ABNORMAL LOW (ref 12.0–15.0)
LYMPHS ABS: 1.8 10*3/uL (ref 0.7–4.0)
LYMPHS PCT: 13.8 % (ref 12.0–46.0)
MCHC: 30.9 g/dL (ref 30.0–36.0)
MCV: 86.4 fl (ref 78.0–100.0)
MONOS PCT: 6.4 % (ref 3.0–12.0)
Monocytes Absolute: 0.8 10*3/uL (ref 0.1–1.0)
NEUTROS PCT: 77.6 % — AB (ref 43.0–77.0)
Neutro Abs: 10.3 10*3/uL — ABNORMAL HIGH (ref 1.4–7.7)
PLATELETS: 320 10*3/uL (ref 150.0–400.0)
RBC: 4.31 Mil/uL (ref 3.87–5.11)
RDW: 18.2 % — ABNORMAL HIGH (ref 11.5–15.5)
WBC: 13.3 10*3/uL — AB (ref 4.0–10.5)

## 2016-09-30 LAB — AST: AST: 15 U/L (ref 0–37)

## 2016-09-30 LAB — ALT: ALT: 18 U/L (ref 0–35)

## 2016-09-30 MED ORDER — AMOXICILLIN 500 MG PO CAPS: 500.0000 mg | ORAL_CAPSULE | Freq: Three times a day (TID) | ORAL | 0 refills | Status: DC

## 2016-09-30 NOTE — Progress Notes (Signed)
Patient presents to clinic today c/o tick bite to the abdomen noted 4 days ago. States tick could not have been present for longer than a few hours. Successfully removed tick. Noted surrounding and erythema the next day, that has worsened since onset. Denies fever, chills, headache or rash noted elsewhere.   Patient has a list of requested labs from her Rheumatologist at [facility]. Was recently started on Dapsone for PAN.  Past Medical History:  Diagnosis Date  . Allergic rhinitis   . Ankle fracture, right   . Anxiety   . Carpal tunnel syndrome   . Cervical cancer ([place]) 1998  . Fibromyalgia   . Hyperlipidemia   . Hypertension   . Migraine headache   . Obesity   . Sleep apnea     Current Outpatient Prescriptions on File Prior to Visit  Medication Sig Dispense Refill  . ALPRAZolam (XANAX) 0.5 MG tablet TAKE 1 TABLET BY MOUTH 3 TIMES DAILY AS NEEDED FOR ANXIETY OR SLEEP 90 tablet 3  . ARIPiprazole (ABILIFY) 10 MG tablet Take 10 mg by mouth daily.    . Armodafinil 250 MG tablet Take 250 mg by mouth 2 (two) times daily.     Marland Kitchen BIOTIN PO Take 1 tablet by mouth daily.    Marland Kitchen BYSTOLIC 10 MG tablet TAKE 1 TABLET BY MOUTH EVERY DAY 90 tablet 1  . calcium citrate (CALCITRATE - DOSED IN MG ELEMENTAL CALCIUM) 950 MG tablet Take 1 tablet by mouth daily.    . clonazePAM (KLONOPIN) 1 MG tablet TAKE 1 TABLET BY MOUTH AT BEDTIME AS NEEDED FOR FOR RESTLESSNESS IN LEG syndrome 30 tablet 3  . cyclobenzaprine (FLEXERIL) 10 MG tablet TAKE 1 TABLET BY MOUTH 3 TIMES DAILY AS NEEDED 270 tablet 1  . DULoxetine (CYMBALTA) 60 MG capsule Take 2 capsules (120 mg total) by mouth daily. 240 capsule 2  . ferrous sulfate 325 (65 FE) MG tablet Take 1 tablet (325 mg total) by mouth daily with breakfast. 30 tablet 6  . furosemide (LASIX) 40 MG tablet Take 1.5 tablets (60 mg total) by mouth daily. (Patient taking differently: Take 80 mg by mouth daily. ) 135 tablet 0  . HYDROcodone-acetaminophen (NORCO) 10-325 MG tablet Take 1  tablet by mouth every 6 (six) hours as needed. 90 tablet 0  . hydroxychloroquine (PLAQUENIL) 200 MG tablet Take 200 mg by mouth 2 (two) times daily.    Marland Kitchen lamoTRIgine (LAMICTAL) 200 MG tablet Take 200 mg by mouth 2 (two) times daily.     . meclizine (ANTIVERT) 25 MG tablet Take 1 tablet (25 mg total) by mouth 3 (three) times daily as needed for dizziness. 30 tablet 0  . Needles & Syringes MISC Use one 19mL syringe and 25 G 1" needle each time B12 is injected. B12 needed once a month 24 each 0  . pantoprazole (PROTONIX) 40 MG tablet Take 1 tablet (40 mg total) by mouth 2 (two) times daily. 180 tablet 1  . predniSONE (DELTASONE) 10 MG tablet Take 20 mg by mouth daily with breakfast.     . promethazine (PHENERGAN) 25 MG tablet TAKE 1 TABLET BY MOUTH EVERY 6 HOURS AS NEEDED FOR FOR NAUSEA AND VOMITING 45 tablet 3  . rosuvastatin (CRESTOR) 20 MG tablet TAKE 1 TABLET BY MOUTH EVERY DAY 90 tablet 1  . sucralfate (CARAFATE) 1 g tablet Take 1 tablet (1 g total) by mouth 4 (four) times daily -  with meals and at bedtime. 90 tablet 0  . valACYclovir (VALTREX)  1000 MG tablet      No current facility-administered medications on file prior to visit.     Allergies  Allergen Reactions  . Niacin Other (See Comments)    REACTION: Swelling, problems breathing  . Sulfamethoxazole-Trimethoprim Other (See Comments)    REACTION: Rash, throat closed, eyes swelled.  . Aspirin Hives  . Bactrim Hives  . Benzoin Compound Rash  . Cephalexin Hives  . Clindamycin/Lincomycin Hives  . Doxycycline Hives  . Iohexol Hives    Pt treated with PO benedryl  . Lisinopril Hives  . Naproxen Hives  . Pnu-Imune [Pneumococcal Polysaccharide Vaccine] Rash  . Sulfonamide Derivatives Rash    Family History  Problem Relation Age of Onset  . Diabetes Mother   . Emphysema Mother   . Allergies Mother   . Hypertension Mother   . Heart disease Father   . Allergies Father   . Prostate cancer Father   . Breast cancer Maternal  Grandmother     Social History   Social History  . Marital status: Married    Spouse name: N/A  . Number of children: N/A  . Years of education: N/A   Occupational History  . disabled. prev worked as a Engineer, manufacturing systems History Main Topics  . Smoking status: Never Smoker  . Smokeless tobacco: Never Used  . Alcohol use No  . Drug use: No  . Sexual activity: Yes     Comment: TAH   Other Topics Concern  . None   Social History Narrative  . None   Review of Systems - See HPI.  All other ROS are negative.  BP 110/80   Pulse 78   Temp 97.8 F (36.6 C) (Oral)   Resp 16   Ht 5\' 2"  (1.575 m)   Wt (!) 345 lb (156.5 kg)   SpO2 97%   BMI 63.10 kg/m   Physical Exam  Constitutional: She is oriented to person, place, and time and well-developed, well-nourished, and in no distress.  HENT:  Head: Normocephalic and atraumatic.  Eyes: Conjunctivae are normal.  Neck: Neck supple.  Cardiovascular: Normal rate, regular rhythm, normal heart sounds and intact distal pulses.   Pulmonary/Chest: Effort normal and breath sounds normal. No respiratory distress. She has no wheezes. She has no rales. She exhibits no tenderness.  Abdominal: Soft. Bowel sounds are normal. She exhibits no distension. There is no tenderness.  Neurological: She is alert and oriented to person, place, and time.  Skin: Skin is warm and dry.     Psychiatric: Affect normal.  Vitals reviewed.    Assessment/Plan: 1. 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 Will fax once resulted.  - CBC w/Diff - AST - ALT  2. Tick bite, initial encounter With concern for early infection. Patient allergic to tetracyclines. Will start Amoxicillin 500 mg TID x 14 days. Alarm signs/symptoms discussed that would prompt ER assessment reviewed with patient. Will follow-up next week.    Leeanne Rio, PA-C

## 2016-09-30 NOTE — Patient Instructions (Signed)
Please go to the lab for blood work. We will call with results and fax to your specialists who requested.  Take the antibiotic as directed with food. Keep area clean and dry. If you note any fever, chills, worsening rash, return immediately.

## 2016-09-30 NOTE — Progress Notes (Signed)
Pre visit review using our clinic review tool, if applicable. No additional management support is needed unless otherwise documented below in the visit note. 

## 2016-10-04 ENCOUNTER — Encounter (HOSPITAL_COMMUNITY): Payer: Self-pay | Admitting: Emergency Medicine

## 2016-10-04 ENCOUNTER — Encounter: Payer: Self-pay | Admitting: Physician Assistant

## 2016-10-06 ENCOUNTER — Other Ambulatory Visit: Payer: Self-pay | Admitting: Family Medicine

## 2016-10-06 ENCOUNTER — Encounter: Payer: Self-pay | Admitting: Family Medicine

## 2016-10-07 ENCOUNTER — Other Ambulatory Visit: Payer: Self-pay | Admitting: Family Medicine

## 2016-10-12 DIAGNOSIS — F329 Major depressive disorder, single episode, unspecified: Secondary | ICD-10-CM | POA: Diagnosis not present

## 2016-10-12 DIAGNOSIS — Z9884 Bariatric surgery status: Secondary | ICD-10-CM | POA: Diagnosis not present

## 2016-10-12 DIAGNOSIS — M79606 Pain in leg, unspecified: Secondary | ICD-10-CM | POA: Diagnosis not present

## 2016-10-12 DIAGNOSIS — Z888 Allergy status to other drugs, medicaments and biological substances status: Secondary | ICD-10-CM | POA: Diagnosis not present

## 2016-10-12 DIAGNOSIS — Z881 Allergy status to other antibiotic agents status: Secondary | ICD-10-CM | POA: Diagnosis not present

## 2016-10-12 DIAGNOSIS — M7989 Other specified soft tissue disorders: Secondary | ICD-10-CM | POA: Diagnosis not present

## 2016-10-12 DIAGNOSIS — Z90710 Acquired absence of both cervix and uterus: Secondary | ICD-10-CM | POA: Diagnosis not present

## 2016-10-12 DIAGNOSIS — Z79899 Other long term (current) drug therapy: Secondary | ICD-10-CM | POA: Diagnosis not present

## 2016-10-12 DIAGNOSIS — K219 Gastro-esophageal reflux disease without esophagitis: Secondary | ICD-10-CM | POA: Diagnosis not present

## 2016-10-12 DIAGNOSIS — M797 Fibromyalgia: Secondary | ICD-10-CM | POA: Diagnosis not present

## 2016-10-12 DIAGNOSIS — Z8541 Personal history of malignant neoplasm of cervix uteri: Secondary | ICD-10-CM | POA: Diagnosis not present

## 2016-10-12 DIAGNOSIS — G4733 Obstructive sleep apnea (adult) (pediatric): Secondary | ICD-10-CM | POA: Diagnosis not present

## 2016-10-12 DIAGNOSIS — Z886 Allergy status to analgesic agent status: Secondary | ICD-10-CM | POA: Diagnosis not present

## 2016-10-12 DIAGNOSIS — Z882 Allergy status to sulfonamides status: Secondary | ICD-10-CM | POA: Diagnosis not present

## 2016-10-12 DIAGNOSIS — I890 Lymphedema, not elsewhere classified: Secondary | ICD-10-CM | POA: Diagnosis not present

## 2016-10-12 DIAGNOSIS — I10 Essential (primary) hypertension: Secondary | ICD-10-CM | POA: Diagnosis not present

## 2016-10-12 DIAGNOSIS — E785 Hyperlipidemia, unspecified: Secondary | ICD-10-CM | POA: Diagnosis not present

## 2016-10-12 DIAGNOSIS — Z9049 Acquired absence of other specified parts of digestive tract: Secondary | ICD-10-CM | POA: Diagnosis not present

## 2016-10-12 DIAGNOSIS — Z7952 Long term (current) use of systemic steroids: Secondary | ICD-10-CM | POA: Diagnosis not present

## 2016-10-12 DIAGNOSIS — M300 Polyarteritis nodosa: Secondary | ICD-10-CM | POA: Diagnosis not present

## 2016-10-12 DIAGNOSIS — M1990 Unspecified osteoarthritis, unspecified site: Secondary | ICD-10-CM | POA: Diagnosis not present

## 2016-10-12 DIAGNOSIS — Z7982 Long term (current) use of aspirin: Secondary | ICD-10-CM | POA: Diagnosis not present

## 2016-10-21 ENCOUNTER — Other Ambulatory Visit: Payer: Self-pay | Admitting: Family Medicine

## 2016-10-21 NOTE — Telephone Encounter (Signed)
Last OV 09/07/16 Alprazolam last filled 09/20/16 #90 with 3

## 2016-10-21 NOTE — Telephone Encounter (Signed)
Pt rx request received to soon.

## 2016-10-26 ENCOUNTER — Encounter: Payer: Self-pay | Admitting: Family Medicine

## 2016-10-28 ENCOUNTER — Other Ambulatory Visit: Payer: Self-pay | Admitting: General Practice

## 2016-10-28 ENCOUNTER — Other Ambulatory Visit: Payer: Self-pay | Admitting: Family Medicine

## 2016-10-28 DIAGNOSIS — Z79899 Other long term (current) drug therapy: Secondary | ICD-10-CM

## 2016-10-28 MED ORDER — HYDROCODONE-ACETAMINOPHEN 10-325 MG PO TABS
1.0000 | ORAL_TABLET | Freq: Four times a day (QID) | ORAL | 0 refills | Status: DC | PRN
Start: 2016-10-28 — End: 2016-11-23

## 2016-10-28 NOTE — Telephone Encounter (Signed)
Last OV 09/30/16 Hydrocodone last filled 09/29/16 #90 with 0  CSC, Low risk

## 2016-10-28 NOTE — Telephone Encounter (Signed)
Reviewed in PCP absence. Databse reviewed. No red flags. Chronic medication. Patient compliant with PCP instructions. Will allow refill in PCP absence. Database printout to be scanned into EMR.

## 2016-10-28 NOTE — Telephone Encounter (Signed)
Pt made aware and rx placed at front desk for pick up.

## 2016-11-01 ENCOUNTER — Encounter: Payer: Self-pay | Admitting: Family Medicine

## 2016-11-02 ENCOUNTER — Other Ambulatory Visit: Payer: Medicare Other

## 2016-11-10 ENCOUNTER — Other Ambulatory Visit: Payer: Self-pay | Admitting: Family Medicine

## 2016-11-21 MED ORDER — TRAMADOL 50 MG TABLET
ORAL_TABLET | Freq: Three times a day (TID) | ORAL | 0 refills | 0 days | Status: CP | PRN
Start: 2016-11-21 — End: 2016-12-16

## 2016-11-22 ENCOUNTER — Other Ambulatory Visit: Payer: Self-pay | Admitting: General Practice

## 2016-11-22 MED ORDER — PROMETHAZINE HCL 25 MG PO TABS: ORAL_TABLET | ORAL | 3 refills | Status: DC

## 2016-11-23 ENCOUNTER — Encounter: Payer: Self-pay | Admitting: Family Medicine

## 2016-11-23 ENCOUNTER — Other Ambulatory Visit: Payer: Self-pay | Admitting: Physician Assistant

## 2016-11-23 MED ORDER — HYDROCODONE-ACETAMINOPHEN 10-325 MG PO TABS: 1.0000 | ORAL_TABLET | Freq: Four times a day (QID) | ORAL | 0 refills | Status: DC | PRN

## 2016-11-23 NOTE — Telephone Encounter (Signed)
Med filled and placed at front desk for pick up. Pt informed.

## 2016-11-23 NOTE — Telephone Encounter (Signed)
Last OV 05/13/16 Hydrocodone last filled 10/28/16 #90 with 0

## 2016-11-23 NOTE — Telephone Encounter (Signed)
Pt due for UDS, has appt tomorrow

## 2016-11-24 ENCOUNTER — Encounter: Payer: Self-pay | Admitting: Family Medicine

## 2016-11-24 ENCOUNTER — Ambulatory Visit: Payer: Medicare Other | Admitting: Family Medicine

## 2016-11-25 ENCOUNTER — Encounter: Payer: Self-pay | Admitting: Family Medicine

## 2016-11-25 ENCOUNTER — Ambulatory Visit: Payer: Medicare Other | Admitting: Family Medicine

## 2016-11-25 ENCOUNTER — Ambulatory Visit (INDEPENDENT_AMBULATORY_CARE_PROVIDER_SITE_OTHER): Payer: Medicare Other | Admitting: Family Medicine

## 2016-11-25 VITALS — BP 121/82 | HR 93 | Temp 98.2°F | Resp 16 | Ht 62.0 in | Wt 350.2 lb

## 2016-11-25 DIAGNOSIS — M300 Polyarteritis nodosa: Secondary | ICD-10-CM

## 2016-11-25 DIAGNOSIS — Z79899 Other long term (current) drug therapy: Secondary | ICD-10-CM | POA: Diagnosis not present

## 2016-11-25 LAB — CBC WITH DIFFERENTIAL/PLATELET
BASOS PCT: 0.7 % (ref 0.0–3.0)
Basophils Absolute: 0.1 10*3/uL (ref 0.0–0.1)
Eosinophils Absolute: 0.3 10*3/uL (ref 0.0–0.7)
Eosinophils Relative: 4.2 % (ref 0.0–5.0)
HEMATOCRIT: 35.8 % — AB (ref 36.0–46.0)
HEMOGLOBIN: 11.3 g/dL — AB (ref 12.0–15.0)
LYMPHS PCT: 23.8 % (ref 12.0–46.0)
Lymphs Abs: 1.9 10*3/uL (ref 0.7–4.0)
MCHC: 31.5 g/dL (ref 30.0–36.0)
MCV: 85.3 fl (ref 78.0–100.0)
MONO ABS: 0.7 10*3/uL (ref 0.1–1.0)
MONOS PCT: 8.7 % (ref 3.0–12.0)
Neutro Abs: 5.1 10*3/uL (ref 1.4–7.7)
Neutrophils Relative %: 62.6 % (ref 43.0–77.0)
Platelets: 329 10*3/uL (ref 150.0–400.0)
RBC: 4.2 Mil/uL (ref 3.87–5.11)
RDW: 17.2 % — ABNORMAL HIGH (ref 11.5–15.5)
WBC: 8.2 10*3/uL (ref 4.0–10.5)

## 2016-11-25 LAB — COMPREHENSIVE METABOLIC PANEL
ALBUMIN: 3.9 g/dL (ref 3.5–5.2)
ALK PHOS: 152 U/L — AB (ref 39–117)
ALT: 14 U/L (ref 0–35)
AST: 18 U/L (ref 0–37)
BUN: 11 mg/dL (ref 6–23)
CALCIUM: 8.9 mg/dL (ref 8.4–10.5)
CO2: 28 mEq/L (ref 19–32)
Chloride: 103 mEq/L (ref 96–112)
Creatinine, Ser: 0.73 mg/dL (ref 0.40–1.20)
GFR: 90.84 mL/min (ref >60.00)
Glucose, Bld: 93 mg/dL (ref 70–99)
Potassium: 3.8 mEq/L (ref 3.5–5.1)
SODIUM: 140 meq/L (ref 135–145)
TOTAL PROTEIN: 6.6 g/dL (ref 6.0–8.3)
Total Bilirubin: 0.4 mg/dL (ref 0.2–1.2)

## 2016-11-25 MED ORDER — PREDNISONE 10 MG PO TABS: ORAL_TABLET | ORAL | 0 refills | Status: DC

## 2016-11-25 MED ORDER — DAPSONE 100 MG TABLET
ORAL_TABLET | Freq: Every day | ORAL | 0 refills | 0.00000 days | Status: CP
Start: 2016-11-25 — End: 2016-12-26

## 2016-11-25 MED ORDER — FUROSEMIDE 20 MG TABLET
ORAL_TABLET | Freq: Every day | ORAL | 0 refills | 0 days | Status: CP
Start: 2016-11-25 — End: 2016-12-16

## 2016-11-25 NOTE — Progress Notes (Signed)
   Subjective:    Patient ID:  [name], female    DOB: December 19, 1970, 46 y.o.   MRN: [id number]  HPI Cellulitis- pt has appt upcoming w/ Derm on 7/23.  She was told that she has both vasculitis and stasis dermatitis but pt is not convinced she has stasis dermatitis.  Pt has appt upcoming w/ lymphedema clinic.  Pt just finished 10 day course of Clindamycin w/o any improvement.  Pt feels this is consistent w/ vasculitis flare and is not infectious.     Review of Systems For ROS see HPI     Objective:   Physical Exam  Constitutional: She is oriented to person, place, and time. She appears well-developed and well-nourished. No distress.  Morbidly obese  Cardiovascular: Intact distal pulses.   Musculoskeletal: She exhibits edema (1-2+ edema of LEs, L>R.  Very red, some warmth, and exquisitely TTP) and tenderness.  Neurological: She is alert and oriented to person, place, and time.  Skin: Skin is warm. No rash noted. There is erythema.  Psychiatric: She has a normal mood and affect. Her behavior is normal.  Vitals reviewed.         Assessment & Plan:

## 2016-11-25 NOTE — Patient Instructions (Addendum)
Follow up as needed/scheduled Start the Prednisone taper as directed- take w/ food Drink plenty of water Call with any questions or concerns Hang in there!!!

## 2016-11-25 NOTE — Addendum Note (Signed)
Addended by: Katina Dung on: 11/25/2016 02:48 PM   Modules accepted: Orders

## 2016-11-25 NOTE — Assessment & Plan Note (Signed)
Deteriorated.  Pt appears to be having a flare w/ redness, warmth, and exquisite tenderness.  Just completed a course of Clinda w/o improvement indicating that this is not likely cellulitis.  Start pred taper.  Reviewed supportive care and red flags that should prompt return.  Pt expressed understanding and is in agreement w/ plan.

## 2016-11-25 NOTE — Progress Notes (Signed)
Pre visit review using our clinic review tool, if applicable. No additional management support is needed unless otherwise documented below in the visit note. 

## 2016-11-28 ENCOUNTER — Encounter: Payer: Self-pay | Admitting: Family Medicine

## 2016-11-29 ENCOUNTER — Encounter: Payer: Self-pay | Admitting: General Practice

## 2016-12-01 ENCOUNTER — Encounter: Payer: Self-pay | Admitting: Family Medicine

## 2016-12-06 ENCOUNTER — Other Ambulatory Visit: Payer: Medicare Other

## 2016-12-06 ENCOUNTER — Other Ambulatory Visit: Payer: Self-pay | Admitting: Family Medicine

## 2016-12-06 DIAGNOSIS — R748 Abnormal levels of other serum enzymes: Secondary | ICD-10-CM

## 2016-12-06 NOTE — Telephone Encounter (Signed)
Patient scheduled to go to [facility] due to lab schedule at [facility] being closed due to staffing.  Please enter appropriate lab order.

## 2016-12-07 ENCOUNTER — Ambulatory Visit: Payer: Medicare Other | Admitting: Occupational Therapy

## 2016-12-13 ENCOUNTER — Telehealth: Payer: Self-pay

## 2016-12-13 NOTE — Telephone Encounter (Signed)
LM requesting call back regarding AWV. Requesting pt to come in at 12:30 for AWV prior to appt with PCP or r/s PCP appt to 10am (same day, 12/21/16) and have AWV at 9.

## 2016-12-16 MED ORDER — TRAMADOL 50 MG TABLET
ORAL_TABLET | 0 refills | 0 days | Status: CP
Start: 2016-12-16 — End: 2017-04-26

## 2016-12-16 MED ORDER — FUROSEMIDE 20 MG TABLET
ORAL_TABLET | 0 refills | 0 days | Status: CP
Start: 2016-12-16 — End: 2017-03-16

## 2016-12-18 ENCOUNTER — Other Ambulatory Visit: Payer: Self-pay | Admitting: Family Medicine

## 2016-12-18 ENCOUNTER — Encounter: Payer: Self-pay | Admitting: Family Medicine

## 2016-12-21 ENCOUNTER — Ambulatory Visit (INDEPENDENT_AMBULATORY_CARE_PROVIDER_SITE_OTHER): Payer: Medicare Other | Admitting: Family Medicine

## 2016-12-21 ENCOUNTER — Ambulatory Visit: Payer: Medicare Other

## 2016-12-21 ENCOUNTER — Encounter: Payer: Self-pay | Admitting: Family Medicine

## 2016-12-21 ENCOUNTER — Ambulatory Visit: Payer: Medicare Other | Admitting: Family Medicine

## 2016-12-21 ENCOUNTER — Other Ambulatory Visit: Payer: Self-pay | Admitting: General Practice

## 2016-12-21 VITALS — BP 140/88 | HR 76 | Resp 18 | Ht 62.0 in | Wt 361.2 lb

## 2016-12-21 DIAGNOSIS — E785 Hyperlipidemia, unspecified: Secondary | ICD-10-CM | POA: Diagnosis not present

## 2016-12-21 DIAGNOSIS — M5136 Other intervertebral disc degeneration, lumbar region: Secondary | ICD-10-CM | POA: Diagnosis not present

## 2016-12-21 DIAGNOSIS — I10 Essential (primary) hypertension: Secondary | ICD-10-CM | POA: Diagnosis not present

## 2016-12-21 DIAGNOSIS — Z9884 Bariatric surgery status: Secondary | ICD-10-CM

## 2016-12-21 DIAGNOSIS — Z0000 Encounter for general adult medical examination without abnormal findings: Secondary | ICD-10-CM

## 2016-12-21 DIAGNOSIS — M51369 Other intervertebral disc degeneration, lumbar region without mention of lumbar back pain or lower extremity pain: Secondary | ICD-10-CM

## 2016-12-21 LAB — BASIC METABOLIC PANEL
BUN: 9 mg/dL (ref 6–23)
CO2: 28 mEq/L (ref 19–32)
Calcium: 8.8 mg/dL (ref 8.4–10.5)
Chloride: 92 mEq/L — ABNORMAL LOW (ref 96–112)
Creatinine, Ser: 0.68 mg/dL (ref 0.40–1.20)
GFR: 98.55 mL/min (ref >60.00)
GLUCOSE: 88 mg/dL (ref 70–99)
POTASSIUM: 4.6 meq/L (ref 3.5–5.1)
SODIUM: 127 meq/L — AB (ref 135–145)

## 2016-12-21 LAB — CBC WITH DIFFERENTIAL/PLATELET
BASOS ABS: 0.1 10*3/uL (ref 0.0–0.1)
BASOS PCT: 0.7 % (ref 0.0–3.0)
EOS PCT: 1.9 % (ref 0.0–5.0)
Eosinophils Absolute: 0.2 10*3/uL (ref 0.0–0.7)
HCT: 39.3 % (ref 36.0–46.0)
HEMOGLOBIN: 12.3 g/dL (ref 12.0–15.0)
LYMPHS PCT: 22 % (ref 12.0–46.0)
Lymphs Abs: 2.6 10*3/uL (ref 0.7–4.0)
MCHC: 31.4 g/dL (ref 30.0–36.0)
MCV: 87.9 fl (ref 78.0–100.0)
MONO ABS: 0.8 10*3/uL (ref 0.1–1.0)
Monocytes Relative: 6.8 % (ref 3.0–12.0)
Neutro Abs: 8.1 10*3/uL — ABNORMAL HIGH (ref 1.4–7.7)
Neutrophils Relative %: 68.6 % (ref 43.0–77.0)
PLATELETS: 261 10*3/uL (ref 150.0–400.0)
RBC: 4.47 Mil/uL (ref 3.87–5.11)
RDW: 17.3 % — AB (ref 11.5–15.5)
WBC: 11.8 10*3/uL — AB (ref 4.0–10.5)

## 2016-12-21 LAB — TSH: TSH: 2 u[IU]/mL (ref 0.35–4.50)

## 2016-12-21 LAB — LIPID PANEL
CHOLESTEROL: 139 mg/dL (ref 0–200)
HDL: 58.9 mg/dL (ref >39.00)
LDL CALC: 51 mg/dL (ref 0–99)
NonHDL: 80.31
TRIGLYCERIDES: 149 mg/dL (ref 0.0–149.0)
Total CHOL/HDL Ratio: 2
VLDL: 29.8 mg/dL (ref 0.0–40.0)

## 2016-12-21 LAB — HEPATIC FUNCTION PANEL
ALT: 25 U/L (ref 0–35)
AST: 19 U/L (ref 0–37)
Albumin: 3.8 g/dL (ref 3.5–5.2)
Alkaline Phosphatase: 107 U/L (ref 39–117)
BILIRUBIN DIRECT: 0.1 mg/dL (ref 0.0–0.3)
BILIRUBIN TOTAL: 0.3 mg/dL (ref 0.2–1.2)
Total Protein: 6.4 g/dL (ref 6.0–8.3)

## 2016-12-21 MED ORDER — HYDROCODONE-ACETAMINOPHEN 10-325 MG PO TABS: 1.0000 | ORAL_TABLET | Freq: Four times a day (QID) | ORAL | 0 refills | Status: DC | PRN

## 2016-12-21 NOTE — Progress Notes (Deleted)
Subjective:   [name] is a 46 y.o. female who presents for Medicare Annual (Subsequent) preventive examination.  Review of Systems:  No ROS.  Medicare Wellness Visit. Additional risk factors are reflected in the social history.    Sleep patterns:  Home Safety/Smoke Alarms: Feels safe in home. Smoke alarms in place.  Living environment; residence and Firearm Safety:  Gray Safety/Bike Helmet: Wears seat belt.   Counseling:   Eye Exam-  Dental-  Female:   Pap-03/16/2013       Mammo-12/28/2015, negative (right).  Breast US (left)-05/13/2016, BI-RADS CATEGORY  3: Probably benign     Dexa scan-        CCS-     Objective:     Vitals: There were no vitals taken for this visit.  There is no height or weight on file to calculate BMI.   Tobacco History  Smoking Status  . Never Smoker  Smokeless Tobacco  . Never Used     Counseling given: Not Answered   Past Medical History:  Diagnosis Date  . Allergic rhinitis   . Ankle fracture, right   . Anxiety   . Carpal tunnel syndrome   . Cervical cancer ([place]) 1998  . Fibromyalgia   . Hyperlipidemia   . Hypertension   . Migraine headache   . Obesity   . Sleep apnea    Past Surgical History:  Procedure Laterality Date  . ABDOMINAL HYSTERECTOMY  10/1996  . ABDOMINAL SURGERY  jan/11/2012   gastric bypass surgery  . CARPAL TUNNEL RELEASE     bilateral   . CESAREAN SECTION    . CHOLECYSTECTOMY  07/1992  . ESOPHAGOGASTRODUODENOSCOPY N/A 01/14/2016   Procedure: ESOPHAGOGASTRODUODENOSCOPY (EGD);  Surgeon: Mauri Pole, MD;  Location: [facility] ENDOSCOPY;  Service: Endoscopy;  Laterality: N/A;   Family History  Problem Relation Age of Onset  . Diabetes Mother   . Emphysema Mother   . Allergies Mother   . Hypertension Mother   . Heart disease Father   . Allergies Father   . Prostate cancer Father   . Breast cancer Maternal Grandmother    History  Sexual Activity  . Sexual activity: Yes    Comment: TAH     Outpatient Encounter Prescriptions as of 12/21/2016  Medication Sig  . ALPRAZolam (XANAX) 0.5 MG tablet TAKE 1 TABLET BY MOUTH 3 TIMES DAILY AS NEEDED FOR ANXIETY OR SLEEP  . amoxicillin (AMOXIL) 500 MG capsule Take 1 capsule (500 mg total) by mouth 3 (three) times daily.  . ARIPiprazole (ABILIFY) 10 MG tablet Take 10 mg by mouth daily.  . Armodafinil 250 MG tablet Take 250 mg by mouth 2 (two) times daily.   Marland Kitchen BIOTIN PO Take 1 tablet by mouth daily.  Marland Kitchen BYSTOLIC 10 MG tablet TAKE 1 TABLET BY MOUTH EVERY DAY  . calcium citrate (CALCITRATE - DOSED IN MG ELEMENTAL CALCIUM) 950 MG tablet Take 1 tablet by mouth daily.  . clonazePAM (KLONOPIN) 1 MG tablet TAKE 1 TABLET BY MOUTH AT BEDTIME AS NEEDED FOR FOR RESTLESSNESS IN LEG syndrome  . cyclobenzaprine (FLEXERIL) 10 MG tablet TAKE 1 TABLET BY MOUTH 3 TIMES DAILY AS NEEDED  . dapsone 25 MG tablet Take 25 mg by mouth daily.  . DULoxetine (CYMBALTA) 60 MG capsule Take 2 capsules (120 mg total) by mouth daily.  . ferrous sulfate 325 (65 FE) MG tablet Take 1 tablet (325 mg total) by mouth daily with breakfast.  . furosemide (LASIX) 40 MG tablet TAKE  2 AND 1/2 TABLETS BY MOUTH ONCE DAILY  . gabapentin (NEURONTIN) 600 MG tablet Take 600 mg by mouth 3 (three) times daily.  Marland Kitchen HYDROcodone-acetaminophen (NORCO) 10-325 MG tablet Take 1 tablet by mouth every 6 (six) hours as needed.  . hydroxychloroquine (PLAQUENIL) 200 MG tablet Take 200 mg by mouth 2 (two) times daily.  Marland Kitchen lamoTRIgine (LAMICTAL) 200 MG tablet Take 200 mg by mouth 2 (two) times daily.   . meclizine (ANTIVERT) 25 MG tablet Take 1 tablet (25 mg total) by mouth 3 (three) times daily as needed for dizziness.  . Needles & Syringes MISC Use one 48mL syringe and 25 G 1" needle each time B12 is injected. B12 needed once a month  . pantoprazole (PROTONIX) 40 MG tablet TAKE 1 TABLET BY MOUTH 2 TIMES DAILY  . predniSONE (DELTASONE) 10 MG tablet 3 tabs x3 days and then 2 tabs x3 days and then 1 tab x3  days.  Take w/ food.  . promethazine (PHENERGAN) 25 MG tablet TAKE 1 TABLET BY MOUTH EVERY 6 HOURS AS NEEDED FOR FOR NAUSEA AND VOMITING  . rosuvastatin (CRESTOR) 20 MG tablet TAKE 1 TABLET BY MOUTH EVERY DAY  . sucralfate (CARAFATE) 1 g tablet Take 1 tablet (1 g total) by mouth 4 (four) times daily -  with meals and at bedtime.  . valACYclovir (VALTREX) 1000 MG tablet    No facility-administered encounter medications on file as of 12/21/2016.     Activities of Daily Living In your present state of health, do you have any difficulty performing the following activities: 01/14/2016  Hearing? N  Vision? N  Difficulty concentrating or making decisions? N  Walking or climbing stairs? Y  Dressing or bathing? N  Doing errands, shopping? N  Some recent data might be hidden    Patient Care Team: [name], MD as PCP - General Eino Farber, PA-C as Consulting Physician (Physician Assistant) Blima Dessert, MD as Referring Physician (Rheumatology)    Assessment:    Physical assessment deferred to PCP.  Exercise Activities and Dietary recommendations   Diet (meal preparation, eat out, water intake, caffeinated beverages, dairy products, fruits and vegetables):   Breakfast: Lunch:  Dinner:      Goals    None     Fall Risk Fall Risk  11/24/2015 06/12/2015 06/03/2015 04/23/2013 03/19/2013  Falls in the past year? No No Yes Yes Yes  Number falls in past yr: - - 2 or more 1 1  Injury with Fall? - - No No No  Risk for fall due to : - - Impaired balance/gait - Impaired balance/gait  Risk for fall due to: Comment - - Accidents per patient - -  Follow up - - Falls prevention discussed - -   Depression Screen PHQ 2/9 Scores 11/24/2015 06/12/2015 06/03/2015 04/23/2013  PHQ - 2 Score 0 0 - 0  Exception Documentation - Patient refusal Medical reason -     Cognitive Function        Immunization History  Administered Date(s) Administered  . Influenza Split 03/17/2011  . Influenza  Whole 02/15/2010  . Influenza,inj,Quad PF,36+ Mos 05/17/2015, 02/23/2016  . Influenza-Unspecified 02/27/2013  . Pneumococcal Polysaccharide-23 03/17/2011  . Td 05/16/2002   Screening Tests Health Maintenance  Topic Date Due  . INFLUENZA VACCINE  12/14/2016  . PAP SMEAR  02/13/2017 (Originally 03/16/2016)  . TETANUS/TDAP  02/13/2017 (Originally 05/16/2012)  . HIV Screening  02/13/2017 (Originally 12/04/1984)      Plan:     I  have personally reviewed and noted the following in the patient's chart:   . Medical and social history . Use of alcohol, tobacco or illicit drugs  . Current medications and supplements . Functional ability and status . Nutritional status . Physical activity . Advanced directives . List of other physicians . Hospitalizations, surgeries, and ER visits in previous 12 months . Vitals . Screenings to include cognitive, depression, and falls . Referrals and appointments  In addition, I have reviewed and discussed with patient certain preventive protocols, quality metrics, and best practice recommendations. A written personalized care plan for preventive services as well as general preventive health recommendations were provided to patient.     Gerilyn Nestle, RN  12/21/2016

## 2016-12-21 NOTE — Assessment & Plan Note (Signed)
Chronic problem.  Tolerating statin w/o difficulty.  Stressed need for healthy diet and regular exercise.  Check labs.  Adjust meds prn  

## 2016-12-21 NOTE — Patient Instructions (Addendum)
Follow up in 6 months to recheck BP and cholesterol We'll notify you of your lab results and make any changes if needed Continue to work on healthy diet and regular exercise- you can do it!! Call and schedule your mammogram! Call with any questions or concerns Enjoy the rest of your summer!!!   Schedule mammogram  [phone].  Schedule GYN appointment.   Bring a copy of your advance directives to your next office visit.  Continue doing brain stimulating activities (puzzles, reading, adult coloring books, staying active) to keep memory sharp.   Health Maintenance, Female Adopting a healthy lifestyle and getting preventive care can go a long way to promote health and wellness. Talk with your health care provider about what schedule of regular examinations is right for you. This is a good chance for you to check in with your provider about disease prevention and staying healthy. In between checkups, there are plenty of things you can do on your own. Experts have done a lot of research about which lifestyle changes and preventive measures are most likely to keep you healthy. Ask your health care provider for more information. Weight and diet Eat a healthy diet  Be sure to include plenty of vegetables, fruits, low-fat dairy products, and lean protein.  Do not eat a lot of foods high in solid fats, added sugars, or salt.  Get regular exercise. This is one of the most important things you can do for your health. ? Most adults should exercise for at least 150 minutes each week. The exercise should increase your heart rate and make you sweat (moderate-intensity exercise). ? Most adults should also do strengthening exercises at least twice a week. This is in addition to the moderate-intensity exercise.  Maintain a healthy weight  Body mass index (BMI) is a measurement that can be used to identify possible weight problems. It estimates body fat based on height and weight. Your health care  provider can help determine your BMI and help you achieve or maintain a healthy weight.  For females 62 years of age and older: ? A BMI below 18.5 is considered underweight. ? A BMI of 18.5 to 24.9 is normal. ? A BMI of 25 to 29.9 is considered overweight. ? A BMI of 30 and above is considered obese.  Watch levels of cholesterol and blood lipids  You should start having your blood tested for lipids and cholesterol at 47 years of age, then have this test every 5 years.  You may need to have your cholesterol levels checked more often if: ? Your lipid or cholesterol levels are high. ? You are older than 47 years of age. ? You are at high risk for heart disease.  Cancer screening Lung Cancer  Lung cancer screening is recommended for adults 65-60 years old who are at high risk for lung cancer because of a history of smoking.  A yearly low-dose CT scan of the lungs is recommended for people who: ? Currently smoke. ? Have quit within the past 15 years. ? Have at least a 30-pack-year history of smoking. A pack year is smoking an average of one pack of cigarettes a day for 1 year.  Yearly screening should continue until it has been 15 years since you quit.  Yearly screening should stop if you develop a health problem that would prevent you from having lung cancer treatment.  Breast Cancer  Practice breast self-awareness. This means understanding how your breasts normally appear and feel.  It also  means doing regular breast self-exams. Let your health care provider know about any changes, no matter how small.  If you are in your 20s or 30s, you should have a clinical breast exam (CBE) by a health care provider every 1-3 years as part of a regular health exam.  If you are 59 or older, have a CBE every year. Also consider having a breast X-ray (mammogram) every year.  If you have a family history of breast cancer, talk to your health care provider about genetic screening.  If you are  at high risk for breast cancer, talk to your health care provider about having an MRI and a mammogram every year.  Breast cancer gene (BRCA) assessment is recommended for women who have family members with BRCA-related cancers. BRCA-related cancers include: ? Breast. ? Ovarian. ? Tubal. ? Peritoneal cancers.  Results of the assessment will determine the need for genetic counseling and BRCA1 and BRCA2 testing.  Cervical Cancer Your health care provider may recommend that you be screened regularly for cancer of the pelvic organs (ovaries, uterus, and vagina). This screening involves a pelvic examination, including checking for microscopic changes to the surface of your cervix (Pap test). You may be encouraged to have this screening done every 3 years, beginning at age 92.  For women ages 40-65, health care providers may recommend pelvic exams and Pap testing every 3 years, or they may recommend the Pap and pelvic exam, combined with testing for human papilloma virus (HPV), every 5 years. Some types of HPV increase your risk of cervical cancer. Testing for HPV may also be done on women of any age with unclear Pap test results.  Other health care providers may not recommend any screening for nonpregnant women who are considered low risk for pelvic cancer and who do not have symptoms. Ask your health care provider if a screening pelvic exam is right for you.  If you have had past treatment for cervical cancer or a condition that could lead to cancer, you need Pap tests and screening for cancer for at least 20 years after your treatment. If Pap tests have been discontinued, your risk factors (such as having a new sexual partner) need to be reassessed to determine if screening should resume. Some women have medical problems that increase the chance of getting cervical cancer. In these cases, your health care provider may recommend more frequent screening and Pap tests.  Colorectal Cancer  This type of  cancer can be detected and often prevented.  Routine colorectal cancer screening usually begins at 47 years of age and continues through 47 years of age.  Your health care provider may recommend screening at an earlier age if you have risk factors for colon cancer.  Your health care provider may also recommend using home test kits to check for hidden blood in the stool.  A small camera at the end of a tube can be used to examine your colon directly (sigmoidoscopy or colonoscopy). This is done to check for the earliest forms of colorectal cancer.  Routine screening usually begins at age 68.  Direct examination of the colon should be repeated every 5-10 years through 47 years of age. However, you may need to be screened more often if early forms of precancerous polyps or small growths are found.  Skin Cancer  Check your skin from head to toe regularly.  Tell your health care provider about any new moles or changes in moles, especially if there is a change in  a mole's shape or color.  Also tell your health care provider if you have a mole that is larger than the size of a pencil eraser.  Always use sunscreen. Apply sunscreen liberally and repeatedly throughout the day.  Protect yourself by wearing long sleeves, pants, a wide-brimmed hat, and sunglasses whenever you are outside.  Heart disease, diabetes, and high blood pressure  High blood pressure causes heart disease and increases the risk of stroke. High blood pressure is more likely to develop in: ? People who have blood pressure in the high end of the normal range (130-139/85-89 mm Hg). ? People who are overweight or obese. ? People who are African American.  If you are 36-47 years of age, have your blood pressure checked every 3-5 years. If you are 10 years of age or older, have your blood pressure checked every year. You should have your blood pressure measured twice-once when you are at a hospital or clinic, and once when you are  not at a hospital or clinic. Record the average of the two measurements. To check your blood pressure when you are not at a hospital or clinic, you can use: ? An automated blood pressure machine at a pharmacy. ? A home blood pressure monitor.  If you are between 78 years and 75 years old, ask your health care provider if you should take aspirin to prevent strokes.  Have regular diabetes screenings. This involves taking a blood sample to check your fasting blood sugar level. ? If you are at a normal weight and have a low risk for diabetes, have this test once every three years after 47 years of age. ? If you are overweight and have a high risk for diabetes, consider being tested at a younger age or more often. Preventing infection Hepatitis B  If you have a higher risk for hepatitis B, you should be screened for this virus. You are considered at high risk for hepatitis B if: ? You were born in a country where hepatitis B is common. Ask your health care provider which countries are considered high risk. ? Your parents were born in a high-risk country, and you have not been immunized against hepatitis B (hepatitis B vaccine). ? You have HIV or AIDS. ? You use needles to inject street drugs. ? You live with someone who has hepatitis B. ? You have had sex with someone who has hepatitis B. ? You get hemodialysis treatment. ? You take certain medicines for conditions, including cancer, organ transplantation, and autoimmune conditions.  Hepatitis C  Blood testing is recommended for: ? Everyone born from [birth year] through 1966. ? Anyone with known risk factors for hepatitis C.  Sexually transmitted infections (STIs)  You should be screened for sexually transmitted infections (STIs) including gonorrhea and chlamydia if: ? You are sexually active and are younger than 47 years of age. ? You are older than 47 years of age and your health care provider tells you that you are at risk for this type of  infection. ? Your sexual activity has changed since you were last screened and you are at an increased risk for chlamydia or gonorrhea. Ask your health care provider if you are at risk.  If you do not have HIV, but are at risk, it may be recommended that you take a prescription medicine daily to prevent HIV infection. This is called pre-exposure prophylaxis (PrEP). You are considered at risk if: ? You are sexually active and do not regularly use condoms  or know the HIV status of your partner(s). ? You take drugs by injection. ? You are sexually active with a partner who has HIV.  Talk with your health care provider about whether you are at high risk of being infected with HIV. If you choose to begin PrEP, you should first be tested for HIV. You should then be tested every 3 months for as long as you are taking PrEP. Pregnancy  If you are premenopausal and you may become pregnant, ask your health care provider about preconception counseling.  If you may become pregnant, take 400 to 800 micrograms (mcg) of folic acid every day.  If you want to prevent pregnancy, talk to your health care provider about birth control (contraception). Osteoporosis and menopause  Osteoporosis is a disease in which the bones lose minerals and strength with aging. This can result in serious bone fractures. Your risk for osteoporosis can be identified using a bone density scan.  If you are 33 years of age or older, or if you are at risk for osteoporosis and fractures, ask your health care provider if you should be screened.  Ask your health care provider whether you should take a calcium or vitamin D supplement to lower your risk for osteoporosis.  Menopause may have certain physical symptoms and risks.  Hormone replacement therapy may reduce some of these symptoms and risks. Talk to your health care provider about whether hormone replacement therapy is right for you. Follow these instructions at home:  Schedule  regular health, dental, and eye exams.  Stay current with your immunizations.  Do not use any tobacco products including cigarettes, chewing tobacco, or electronic cigarettes.  If you are pregnant, do not drink alcohol.  If you are breastfeeding, limit how much and how often you drink alcohol.  Limit alcohol intake to no more than 1 drink per day for nonpregnant women. One drink equals 12 ounces of beer, 5 ounces of wine, or 1 ounces of hard liquor.  Do not use street drugs.  Do not share needles.  Ask your health care provider for help if you need support or information about quitting drugs.  Tell your health care provider if you often feel depressed.  Tell your health care provider if you have ever been abused or do not feel safe at home. This information is not intended to replace advice given to you by your health care provider. Make sure you discuss any questions you have with your health care provider. Document Released: 11/15/2010 Document Revised: 10/08/2015 Document Reviewed: 02/03/2015 Elsevier Interactive Patient Education  [employer].

## 2016-12-21 NOTE — Assessment & Plan Note (Signed)
Ongoing issue.  Pt encouraged to take MVI.

## 2016-12-21 NOTE — Progress Notes (Addendum)
Subjective:   [name] is a 46 y.o. female who presents for Medicare Annual (Subsequent) preventive examination.  Review of Systems:  No ROS.  Medicare Wellness Visit. Additional risk factors are reflected in the social history.  Cardiac Risk Factors include: dyslipidemia;hypertension;obesity (BMI >30kg/m2);sedentary lifestyle;family history of premature cardiovascular disease   Sleep patterns: Sleeps 6 hours, does not feel rested. Does not use CPAP.  Home Safety/Smoke Alarms: Feels safe in home. Smoke alarms in place.  Living environment; residence and Firearm Safety: Lives with parents and child in 2 story home.  Seat Belt Safety/Bike Helmet: Wears seat belt.   Counseling:   Eye Exam-Last exam 12/2015, appt next week. Every 6 months, Dr. Yevonne [facility]) Dental-Last exam 08/2016, every 6 months. Dr. Sabino Snipes   Female:   Pap-03/16/2013, followed by GYN. Will schedule appointment.     Mammo-12/28/2015, negative (right) [place] Left breast-05/13/2016, BI-RADS CATEGORY  3: Probably benign. Repeat 12/2016, will schedule.     Dexa scan-N/A        CCS-N/A     Objective:     Vitals: BP 140/88 (BP Location: Left Arm, Patient Position: Sitting, Cuff Size: Large)   Pulse 76   Resp 18   Ht 5\' 2"  (1.575 m)   Wt (!) 361 lb 3.2 oz (163.8 kg)   SpO2 98%   BMI 66.06 kg/m   Body mass index is 66.06 kg/m.   Tobacco History  Smoking Status  . Never Smoker  Smokeless Tobacco  . Never Used     Counseling given: Not Answered   Past Medical History:  Diagnosis Date  . Allergic rhinitis   . Ankle fracture, right   . Anxiety   . Carpal tunnel syndrome   . Cervical cancer ([place]) 1998  . Fibromyalgia   . Hyperlipidemia   . Hypertension   . Migraine headache   . Obesity   . Sleep apnea    Past Surgical History:  Procedure Laterality Date  . ABDOMINAL HYSTERECTOMY  10/1996  . ABDOMINAL SURGERY  jan/11/2012   gastric bypass surgery  . CARPAL TUNNEL RELEASE     bilateral     . CESAREAN SECTION    . CHOLECYSTECTOMY  07/1992  . ESOPHAGOGASTRODUODENOSCOPY N/A 01/14/2016   Procedure: ESOPHAGOGASTRODUODENOSCOPY (EGD);  Surgeon: Mauri Pole, MD;  Location: [facility] ENDOSCOPY;  Service: Endoscopy;  Laterality: N/A;   Family History  Problem Relation Age of Onset  . Diabetes Mother   . Emphysema Mother   . Allergies Mother   . Hypertension Mother   . Heart disease Father   . Allergies Father   . Prostate cancer Father   . Breast cancer Maternal Grandmother    History  Sexual Activity  . Sexual activity: Yes    Comment: TAH    Outpatient Encounter Prescriptions as of 12/21/2016  Medication Sig  . ALPRAZolam (XANAX) 0.5 MG tablet TAKE 1 TABLET BY MOUTH 3 TIMES DAILY AS NEEDED FOR ANXIETY OR SLEEP  . ARIPiprazole (ABILIFY) 10 MG tablet Take 10 mg by mouth daily.  . Armodafinil 250 MG tablet Take 250 mg by mouth 2 (two) times daily.   Marland Kitchen BIOTIN PO Take 1 tablet by mouth daily.  Marland Kitchen BYSTOLIC 10 MG tablet TAKE 1 TABLET BY MOUTH EVERY DAY  . calcium citrate (CALCITRATE - DOSED IN MG ELEMENTAL CALCIUM) 950 MG tablet Take 1 tablet by mouth daily.  . clonazePAM (KLONOPIN) 1 MG tablet TAKE 1 TABLET BY MOUTH AT BEDTIME AS NEEDED FOR FOR RESTLESSNESS IN  LEG syndrome  . cyclobenzaprine (FLEXERIL) 10 MG tablet TAKE 1 TABLET BY MOUTH 3 TIMES DAILY AS NEEDED  . DULoxetine (CYMBALTA) 60 MG capsule Take 2 capsules (120 mg total) by mouth daily.  . ferrous sulfate 325 (65 FE) MG tablet Take 1 tablet (325 mg total) by mouth daily with breakfast.  . furosemide (LASIX) 40 MG tablet TAKE 2 AND 1/2 TABLETS BY MOUTH ONCE DAILY  . gabapentin (NEURONTIN) 600 MG tablet Take 600 mg by mouth 3 (three) times daily.  . hydroxychloroquine (PLAQUENIL) 200 MG tablet Take 200 mg by mouth 2 (two) times daily.  Marland Kitchen lamoTRIgine (LAMICTAL) 200 MG tablet Take 200 mg by mouth 2 (two) times daily.   . meclizine (ANTIVERT) 25 MG tablet Take 1 tablet (25 mg total) by mouth 3 (three) times daily as needed for  dizziness.  . pantoprazole (PROTONIX) 40 MG tablet TAKE 1 TABLET BY MOUTH 2 TIMES DAILY  . predniSONE (DELTASONE) 10 MG tablet 3 tabs x3 days and then 2 tabs x3 days and then 1 tab x3 days.  Take w/ food.  . promethazine (PHENERGAN) 25 MG tablet TAKE 1 TABLET BY MOUTH EVERY 6 HOURS AS NEEDED FOR FOR NAUSEA AND VOMITING  . rosuvastatin (CRESTOR) 20 MG tablet TAKE 1 TABLET BY MOUTH EVERY DAY  . valACYclovir (VALTREX) 1000 MG tablet   . [DISCONTINUED] HYDROcodone-acetaminophen (NORCO) 10-325 MG tablet Take 1 tablet by mouth every 6 (six) hours as needed.  . dapsone 25 MG tablet Take 25 mg by mouth daily.  . Needles & Syringes MISC Use one 30mL syringe and 25 G 1" needle each time B12 is injected. B12 needed once a month (Patient not taking: Reported on 12/21/2016)  . sucralfate (CARAFATE) 1 g tablet Take 1 tablet (1 g total) by mouth 4 (four) times daily -  with meals and at bedtime. (Patient not taking: Reported on 12/21/2016)  . [DISCONTINUED] amoxicillin (AMOXIL) 500 MG capsule Take 1 capsule (500 mg total) by mouth 3 (three) times daily.   No facility-administered encounter medications on file as of 12/21/2016.     Activities of Daily Living In your present state of health, do you have any difficulty performing the following activities: 12/21/2016 01/14/2016  Hearing? N N  Vision? N N  Difficulty concentrating or making decisions? N N  Walking or climbing stairs? N Y  Dressing or bathing? N N  Doing errands, shopping? N N  Preparing Food and eating ? N -  Using the Toilet? N -  In the past six months, have you accidently leaked urine? N -  Do you have problems with loss of bowel control? N -  Managing your Medications? N -  Managing your Finances? N -  Housekeeping or managing your Housekeeping? N -  Some recent data might be hidden    Patient Care Team: [name], MD as PCP - General Eino Farber, PA-C as Consulting Physician (Physician Assistant) Blima Dessert, MD as Referring  Physician (Rheumatology) Loa Socks, MD as Referring Physician (Dermatology) Terrance Mass, MD (Gynecology)    Assessment:    Physical assessment deferred to PCP.  Exercise Activities and Dietary recommendations Current Exercise Habits: The patient does not participate in regular exercise at present, Exercise limited by: orthopedic condition(s);Other - see comments (pain from fibromyalgia)   Diet (meal preparation, eat out, water intake, caffeinated beverages, dairy products, fruits and vegetables): Drinks water, tea and milk.  Breakfast: eggs, toast, bacon Lunch: banana sandwich, [place], salad Dinner: protein and vegetables.  Discussed heart healthy diet and increasing activity as tolerated.   Goals    . Weight (lb) < 320 lb (145.2 kg)          Lose weight by watching portion control, healthy options and increasing activity as tolerated.       Fall Risk Fall Risk  12/21/2016 11/24/2015 06/12/2015 06/03/2015 04/23/2013  Falls in the past year? No No No Yes Yes  Number falls in past yr: - - - 2 or more 1  Injury with Fall? - - - No No  Risk for fall due to : - - - Impaired balance/gait -  Risk for fall due to: Comment - - - Accidents per patient -  Follow up - - - Falls prevention discussed -   Depression Screen PHQ 2/9 Scores 12/21/2016 11/24/2015 06/12/2015 06/03/2015  PHQ - 2 Score 0 0 0 -  Exception Documentation - - Patient refusal Medical reason     Cognitive Function       Ad8 score reviewed for issues:  Issues making decisions: no  Less interest in hobbies / activities: no  Repeats questions, stories (family complaining): no  Trouble using ordinary gadgets (microwave, computer, phone): no  Forgets the month or year: no  Mismanaging finances: no  Remembering appts: no  Daily problems with thinking and/or memory: no Ad8 score is=0     Immunization History  Administered Date(s) Administered  . Influenza Split 03/17/2011  . Influenza Whole  02/15/2010  . Influenza,inj,Quad PF,36+ Mos 05/17/2015, 02/23/2016  . Influenza-Unspecified 02/27/2013  . Pneumococcal Polysaccharide-23 03/17/2011  . Td 05/16/2002   Screening Tests Health Maintenance  Topic Date Due  . INFLUENZA VACCINE  12/14/2016  . PAP SMEAR  02/13/2017 (Originally 03/16/2016)  . TETANUS/TDAP  02/13/2017 (Originally 05/16/2012)  . HIV Screening  02/13/2017 (Originally 12/04/1984)      Plan:    Schedule mammogram  [phone].  Schedule GYN appointment.   Bring a copy of your advance directives to your next office visit.  Continue doing brain stimulating activities (puzzles, reading, adult coloring books, staying active) to keep memory sharp.     I have personally reviewed and noted the following in the patient's chart:   . Medical and social history . Use of alcohol, tobacco or illicit drugs  . Current medications and supplements . Functional ability and status . Nutritional status . Physical activity . Advanced directives . List of other physicians . Hospitalizations, surgeries, and ER visits in previous 12 months . Vitals . Screenings to include cognitive, depression, and falls . Referrals and appointments  In addition, I have reviewed and discussed with patient certain preventive protocols, quality metrics, and best practice recommendations. A written personalized care plan for preventive services as well as general preventive health recommendations were provided to patient.     Gerilyn Nestle, RN  12/21/2016   Reviewed documentation above and agree w/ RN.   [name], MD

## 2016-12-21 NOTE — Assessment & Plan Note (Signed)
Chronic problem.  Currently adequately controlled on hydrocodone.  Refill provided today

## 2016-12-21 NOTE — Progress Notes (Signed)
   Subjective:    Patient ID: [name], female    DOB: 02-02-1971, 46 y.o.   MRN: [id number]  HPI Hyperlipidemia- chronic problem, on Crestor 20mg  daily.  Denies abd pain, N/V.  HTN- chronic problem, on on Bystolic and Lasix daily.  Denies CP, SOB, HAs, visual changes.  Obesity- chronic problem.  Pt has had bariatric surgery but this has been ineffective.  Pt has gained another 11 lbs.  Chronic pain- ongoing issue due to DDD and fibromyalgia.  Refill provided on hydrocodone.   Review of Systems For ROS see HPI     Objective:   Physical Exam  Constitutional: She is oriented to person, place, and time. She appears well-developed and well-nourished. No distress.  Morbidly obese  HENT:  Head: Normocephalic and atraumatic.  Eyes: Pupils are equal, round, and reactive to light. Conjunctivae and EOM are normal.  Neck: Normal range of motion. Neck supple. No thyromegaly present.  Cardiovascular: Normal rate, regular rhythm, normal heart sounds and intact distal pulses.   No murmur heard. Pulmonary/Chest: Effort normal and breath sounds normal. No respiratory distress.  Abdominal: Soft. She exhibits no distension. There is no tenderness.  Musculoskeletal: She exhibits edema (1+ swelling of LE's bilaterally).  Lymphadenopathy:    She has no cervical adenopathy.  Neurological: She is alert and oriented to person, place, and time.  Skin: Skin is warm and dry. There is erythema (erythema of LE's bilaterally).  Psychiatric: She has a normal mood and affect. Her behavior is normal.  Vitals reviewed.         Assessment & Plan:

## 2016-12-21 NOTE — Assessment & Plan Note (Signed)
Chronic problem.  Pt has gained another 11 lbs!  Stressed need for healthy diet and regular exercise.  Check labs to risk stratify.  Will follow.

## 2016-12-21 NOTE — Assessment & Plan Note (Signed)
Chronic problem.  Adequate control.  Asymptomatic.  Check labs.  No anticipated med changes.  Will follow. 

## 2016-12-22 ENCOUNTER — Other Ambulatory Visit: Payer: Self-pay | Admitting: Family Medicine

## 2016-12-22 ENCOUNTER — Encounter: Payer: Self-pay | Admitting: Family Medicine

## 2016-12-22 DIAGNOSIS — R7989 Other specified abnormal findings of blood chemistry: Secondary | ICD-10-CM

## 2016-12-23 ENCOUNTER — Ambulatory Visit: Admission: RE | Admit: 2016-12-23 | Discharge: 2016-12-23 | Payer: MEDICARE

## 2016-12-23 DIAGNOSIS — R21 Rash and other nonspecific skin eruption: Principal | ICD-10-CM

## 2016-12-26 ENCOUNTER — Ambulatory Visit
Admission: RE | Admit: 2016-12-26 | Discharge: 2016-12-26 | Payer: MEDICARE | Attending: Dermatology | Admitting: Dermatology

## 2016-12-26 DIAGNOSIS — R21 Rash and other nonspecific skin eruption: Principal | ICD-10-CM

## 2016-12-26 DIAGNOSIS — M300 Polyarteritis nodosa: Secondary | ICD-10-CM

## 2016-12-26 DIAGNOSIS — L958 Other vasculitis limited to the skin: Secondary | ICD-10-CM | POA: Diagnosis not present

## 2016-12-26 DIAGNOSIS — L928 Other granulomatous disorders of the skin and subcutaneous tissue: Secondary | ICD-10-CM | POA: Diagnosis not present

## 2016-12-26 MED ORDER — DAPSONE 100 MG TABLET
ORAL_TABLET | Freq: Every day | ORAL | 0 refills | 0.00000 days | Status: CP
Start: 2016-12-26 — End: 2017-03-26

## 2016-12-26 MED ORDER — DAPSONE 25 MG TABLET
ORAL_TABLET | Freq: Every day | ORAL | 0 refills | 0.00000 days | Status: CP
Start: 2016-12-26 — End: 2017-03-26

## 2016-12-30 ENCOUNTER — Encounter: Payer: Self-pay | Admitting: Family Medicine

## 2016-12-30 ENCOUNTER — Other Ambulatory Visit: Payer: Medicare Other

## 2016-12-30 DIAGNOSIS — L959 Vasculitis limited to the skin, unspecified: Secondary | ICD-10-CM | POA: Diagnosis not present

## 2017-01-02 ENCOUNTER — Other Ambulatory Visit: Payer: Self-pay | Admitting: Family Medicine

## 2017-01-02 NOTE — Telephone Encounter (Signed)
Medication filled to pharmacy as requested.   

## 2017-01-02 NOTE — Telephone Encounter (Signed)
Last OV 12/21/16 Clonazepam last filled 08/22/16 #30 with 3

## 2017-01-04 ENCOUNTER — Ambulatory Visit: Payer: Medicare Other | Admitting: Family Medicine

## 2017-01-05 ENCOUNTER — Ambulatory Visit: Payer: Medicare Other | Attending: Vascular Surgery | Admitting: Occupational Therapy

## 2017-01-05 DIAGNOSIS — I890 Lymphedema, not elsewhere classified: Secondary | ICD-10-CM | POA: Insufficient documentation

## 2017-01-05 NOTE — Patient Instructions (Signed)

## 2017-01-06 ENCOUNTER — Encounter: Payer: Self-pay | Admitting: Family Medicine

## 2017-01-06 ENCOUNTER — Ambulatory Visit (INDEPENDENT_AMBULATORY_CARE_PROVIDER_SITE_OTHER): Payer: Medicare Other | Admitting: Family Medicine

## 2017-01-06 ENCOUNTER — Telehealth: Payer: Self-pay | Admitting: *Deleted

## 2017-01-06 VITALS — BP 136/86 | HR 86 | Resp 17 | Ht 62.0 in | Wt 363.5 lb

## 2017-01-06 DIAGNOSIS — Z4802 Encounter for removal of sutures: Secondary | ICD-10-CM

## 2017-01-06 DIAGNOSIS — G4733 Obstructive sleep apnea (adult) (pediatric): Secondary | ICD-10-CM | POA: Diagnosis not present

## 2017-01-06 DIAGNOSIS — R748 Abnormal levels of other serum enzymes: Secondary | ICD-10-CM | POA: Diagnosis not present

## 2017-01-06 DIAGNOSIS — R7989 Other specified abnormal findings of blood chemistry: Secondary | ICD-10-CM

## 2017-01-06 LAB — BASIC METABOLIC PANEL
BUN: 9 mg/dL (ref 6–23)
CALCIUM: 8.9 mg/dL (ref 8.4–10.5)
CO2: 31 meq/L (ref 19–32)
CREATININE: 0.6 mg/dL (ref 0.40–1.20)
Chloride: 101 mEq/L (ref 96–112)
GFR: 113.85 mL/min (ref >60.00)
GLUCOSE: 44 mg/dL — AB (ref 70–99)
Potassium: 4 mEq/L (ref 3.5–5.1)
Sodium: 140 mEq/L (ref 135–145)

## 2017-01-06 LAB — GAMMA GT: GGT: 204 U/L — ABNORMAL HIGH (ref 7–51)

## 2017-01-06 NOTE — Telephone Encounter (Addendum)
Lab called to report a critical:  Glucose - 44   Routed to provider to advise

## 2017-01-06 NOTE — Telephone Encounter (Signed)
Noted. Nothing to do

## 2017-01-06 NOTE — Patient Instructions (Signed)
Follow up as needed/scheduled We'll notify you of your lab results and make any changes if needed We'll call you with your Pulmonary appt for the sleep apnea and CPAP Call with any questions or concerns Happy Labor Day!!!

## 2017-01-06 NOTE — Progress Notes (Signed)
Pre visit review using our clinic review tool, if applicable. No additional management support is needed unless otherwise documented below in the visit note. 

## 2017-01-06 NOTE — Progress Notes (Signed)
   Subjective:    Patient ID: [name], female    DOB: 04/06/1971, 46 y.o.   MRN: [id number]  HPI [name]- pt doesn't feel that CPAP is functioning properly.  Asking for referral back to pulm to get new equipment.  Suture removal- pt had bx done at derm and is here today for suture removal.  Has 5 stitches that need to be removed from R ankle   Review of Systems For ROS see HPI     Objective:   Physical Exam  Constitutional: She is oriented to person, place, and time. She appears well-developed and well-nourished. No distress.  Morbidly obese  Neurological: She is alert and oriented to person, place, and time.  Skin: Skin is warm and dry. There is erythema (mild redness of LEs bilaterally).  Well healed biopsy site of R ankle- 5 sutures present.  Area prepped w/ alcohol pad and sutures removed w/o difficulty  Vitals reviewed.         Assessment & Plan:  Suture removal- pt tolerated procedure w/o difficulty.  Wound instructions given.

## 2017-01-06 NOTE — Assessment & Plan Note (Signed)
Refer back to pulm for evaluation and equipment upgrade

## 2017-01-09 ENCOUNTER — Encounter: Payer: Self-pay | Admitting: Family Medicine

## 2017-01-10 ENCOUNTER — Encounter: Payer: Self-pay | Admitting: Occupational Therapy

## 2017-01-10 ENCOUNTER — Encounter: Payer: Self-pay | Admitting: Family Medicine

## 2017-01-10 ENCOUNTER — Other Ambulatory Visit: Payer: Self-pay | Admitting: Family Medicine

## 2017-01-10 DIAGNOSIS — Z79899 Other long term (current) drug therapy: Secondary | ICD-10-CM

## 2017-01-10 NOTE — Telephone Encounter (Signed)
Pt called wanting to schedule lab appt. Not sure if order from [facility] have been sent to [place], please advise and I will call pt and schedule an appt. Thanks

## 2017-01-11 ENCOUNTER — Other Ambulatory Visit (INDEPENDENT_AMBULATORY_CARE_PROVIDER_SITE_OTHER): Payer: Medicare Other

## 2017-01-11 DIAGNOSIS — Z79899 Other long term (current) drug therapy: Secondary | ICD-10-CM | POA: Diagnosis not present

## 2017-01-11 LAB — COMPREHENSIVE METABOLIC PANEL
ALT: 23 U/L (ref 0–35)
AST: 20 U/L (ref 0–37)
Albumin: 4 g/dL (ref 3.5–5.2)
Alkaline Phosphatase: 157 U/L — ABNORMAL HIGH (ref 39–117)
BILIRUBIN TOTAL: 0.4 mg/dL (ref 0.2–1.2)
BUN: 9 mg/dL (ref 6–23)
CHLORIDE: 97 meq/L (ref 96–112)
CO2: 33 meq/L — AB (ref 19–32)
CREATININE: 0.73 mg/dL (ref 0.40–1.20)
Calcium: 9.3 mg/dL (ref 8.4–10.5)
GFR: 90.78 mL/min (ref >60.00)
GLUCOSE: 138 mg/dL — AB (ref 70–99)
Potassium: 4.5 mEq/L (ref 3.5–5.1)
Sodium: 139 mEq/L (ref 135–145)
Total Protein: 6.7 g/dL (ref 6.0–8.3)

## 2017-01-11 LAB — CBC WITH DIFFERENTIAL/PLATELET
BASOS ABS: 0.1 10*3/uL (ref 0.0–0.1)
BASOS PCT: 0.8 % (ref 0.0–3.0)
Eosinophils Absolute: 0.2 10*3/uL (ref 0.0–0.7)
Eosinophils Relative: 1.4 % (ref 0.0–5.0)
HCT: 38.2 % (ref 36.0–46.0)
Hemoglobin: 11.8 g/dL — ABNORMAL LOW (ref 12.0–15.0)
LYMPHS ABS: 1.6 10*3/uL (ref 0.7–4.0)
Lymphocytes Relative: 13.1 % (ref 12.0–46.0)
MCHC: 30.9 g/dL (ref 30.0–36.0)
MCV: 89.3 fl (ref 78.0–100.0)
Monocytes Absolute: 0.8 10*3/uL (ref 0.1–1.0)
Monocytes Relative: 6.8 % (ref 3.0–12.0)
NEUTROS ABS: 9.4 10*3/uL — AB (ref 1.4–7.7)
NEUTROS PCT: 77.9 % — AB (ref 43.0–77.0)
PLATELETS: 386 10*3/uL (ref 150.0–400.0)
RBC: 4.28 Mil/uL (ref 3.87–5.11)
RDW: 18 % — AB (ref 11.5–15.5)
WBC: 12.1 10*3/uL — ABNORMAL HIGH (ref 4.0–10.5)

## 2017-01-11 NOTE — Therapy (Signed)
[place] MAIN [facility] SERVICES [address] [place], [place], [address] Phone: [phone]   Fax:  [phone]  Occupational Therapy Evaluation  Patient Details  Name: [name] MRN: [id number] Date of Birth: [birth year] Referring Provider: Loleta Books, MD  Encounter Date: 01/05/2017      OT End of Session - 01/10/17 0824    Visit Number 1   Number of Visits 36   Date for OT Re-Evaluation 04/05/17   OT Start Time 0200   OT Stop Time 0300   OT Time Calculation (min) 60 min   Activity Tolerance Patient tolerated treatment well;No increased pain;Patient limited by pain   Behavior During Therapy [facility] for tasks assessed/performed      Past Medical History:  Diagnosis Date  . Allergic rhinitis   . Ankle fracture, right   . Anxiety   . Carpal tunnel syndrome   . Cervical cancer ([place]) 1998  . Fibromyalgia   . Hyperlipidemia   . Hypertension   . Migraine headache   . Obesity   . Sleep apnea     Past Surgical History:  Procedure Laterality Date  . ABDOMINAL HYSTERECTOMY  10/1996  . ABDOMINAL SURGERY  jan/11/2012   gastric bypass surgery  . CARPAL TUNNEL RELEASE     bilateral   . CESAREAN SECTION    . CHOLECYSTECTOMY  07/1992  . ESOPHAGOGASTRODUODENOSCOPY N/A 01/14/2016   Procedure: ESOPHAGOGASTRODUODENOSCOPY (EGD);  Surgeon: Mauri Pole, MD;  Location: [facility] ENDOSCOPY;  Service: Endoscopy;  Laterality: N/A;    There were no vitals filed for this visit.           LYMPHEDEMA/ONCOLOGY QUESTIONNAIRE - 01/10/17 0849      Lymphedema Assessments   Lymphedema Assessments Lower extremities     Right Lower Extremity Lymphedema   Other BLE comparative limb volumetrics TBA at 1st Rx        .             BLE LYMPHEDEMA, moderate-severe stage II 2/2 CVI  Skin Description Hyper-Keratosis Peau' de Orange Shiny Tight Fibrotic Fatty Doughy Indurated    x  x x   x   Hydration Dry Flaky Erythema Other   x   x    Color Redness Present Pallor Blanching Hemosiderin Staining Other   x  x  Dark brown-ish black halo-like areas surrounding scars from old lesions R>L    Odor Malodorous Yeast Present Absent      x   Temperature Warm Cool Normal   x     Pitting Edema 1+ 2+ 3+ 4+ >4     x     Girth Symmetrical Asymmetrical Other Distribution    X L>R Primarily below knees   Stemmer Sign Positive Negative   Strong +    Lymphorrea History Of:  Present Absent     X   Wounds History Of (when) Present Absent Venous Arterial Pressure Size   Reports past leg ulcer         Signs of Infection Redness Warmth Erythema Acute Swelling Drainage            x x x      Scars Adhesions Hypersensitivity        Sensation Light Touch Deep pressure Hypersensitivty   Present Absent Present Absent Present Absent   x     x    Nails WNL Fungus Present Other   x     Hair Growth Symmetrical Asymmetrical    x  Odor Malodorous Yeast Present Absent               OT Long Term Goals - 01/10/17 0826      OT LONG TERM GOAL #1   Title Pt independent w/ lymphedema precautions and prevention principals and strategies to limit LE progression and infection risk.   Baseline dependent   Time 2   Period Weeks   Status New   Target Date --  2 weeks after OT rx start date     OT LONG TERM GOAL #2   Title Lymphedema (LE) management/ self-care: Pt able to apply knee length, multi layered, gradient compression wraps with maximum caregiver assistance using proper techniques within 2 weeks to achieve optimal limb volume reductions bilaterally.   Baseline dependent   Time 2   Period Weeks   Status New   Target Date --  2 weeks after OT rx start date     OT LONG TERM GOAL #3   Title Lymphedema (LE) management/ self-care:  Pt to achieve at least 10% limb volume reduction  bilaterally below the knees during Intensive Phase CDT to limit progression, to reduce leg pain, to improve ADLs performance, and to  facilitate safe functional mobility and ambulation.   Baseline dependent   Time 12   Period Weeks   Status New   Target Date 04/05/17     OT LONG TERM GOAL #4   Title Lymphedema (LE) management/ self-care:  Pt to tolerate daily compression wraps, compression garments and/ or HOS devices in keeping w/ prescribed wear regime within 1 week of issue date of each to progress and retain clinical and functional gains and to limit LE progression.   Baseline dependent   Time 12   Period Weeks   Status New   Target Date 04/05/17     OT LONG TERM GOAL #5   Title Lymphedema (LE) management/ self-care:  During Management Phase CDT Pt to sustain current limb volumes within 5%, and all other clinical gains achieved during OT treatment with needed level of caregiver assistance to limit LE progression, infection risk and further functional decline.   Baseline dependent   Time 6   Period Months   Status New   Target Date 07/09/17               Plan - 01/10/17 0851    Clinical Impression Statement [name] is a 46 y.o. female presenting with mild, stage II, BLE lymphedema (LE) with onset in 2004 secondary to suspected venous insufficiency, long standing vasculitis, obserity, and dependent positioning w/ sedentary lifestyle. Pt's condition has worsened over time. She has not undergone LE treatment previously and does not tolerate off-the-shelf compression garments due to poor fit. Leg swelling and  limit Pt;'s functional performance in all occupational domains, including basic and instrumental ADLs, productive activities, leisure pusuits and social participation. Without skilled  Occupational Therapy coe Complete Decongestive Therapy (CDT) to control and decrease swelling and reduce associated pain, Pt's condition iwill worsen and further functional decline is expected.    Occupational performance deficits (Please refer to evaluation for details): ADL's;IADL's;Rest and Sleep;Work;Leisure;Social  Participation;Other  limits funtional ambulation and transfers; limits body image   Rehab Potential Good   OT Frequency 3x / week   OT Duration 12 weeks   OT Treatment/Interventions Self-care/ADL training;Therapeutic exercise;Patient/family education;Manual Therapy;Manual lymph drainage;Therapeutic exercises;Other (comment);DME and/or AE instruction;Compression bandaging;Therapeutic activities  skin care   Clinical Decision Making Several treatment options, min-mod task modification necessary  OT Home Exercise Plan BLE lymphatic pumping ther ex- 2 sets of 10 each in sequence- 2 x q day   Recommended Other Services fit with BLE custom , ccl 2 or 3, flat knit compression knee highs, toe caps, and HOS devices to limit fibrosis formation during HOS. Pt may need financial assistance with garments as Medicare does not cover these.   Consulted and Agree with Plan of Care Patient      Patient will benefit from skilled therapeutic intervention in order to improve the following deficits and impairments:  Abnormal gait, Decreased skin integrity, Decreased knowledge of precautions, Decreased activity tolerance, Decreased knowledge of use of DME, Decreased mobility, Difficulty walking, Obesity, Decreased range of motion, Increased edema, Pain, Impaired flexibility  Visit Diagnosis: Lymphedema, not elsewhere classified - Plan: Ot plan of care cert/re-cert    Problem List Patient Active Problem List   Diagnosis Date Noted  . Chronic narcotic use 09/07/2016  . BRBPR (bright red blood per rectum) 01/14/2016  . Acute blood loss anemia 01/14/2016  . Melena   . Multiple gastric ulcers   . PAN (polyarteritis nodosa) ([place]) 11/24/2015  . Flank pain 06/14/2015  . Chronic gout of multiple sites 02/25/2014  . GERD (gastroesophageal reflux disease) 01/08/2014  . Abdominal bloating 01/08/2014  . Routine general medical examination at a health care facility 04/23/2013  . Leg pain, lateral 12/11/2012  .  Bariatric surgery status 10/31/2012  . Tinea corporis 09/25/2012  . Psoriasis 01/09/2012  . DDD (degenerative disc disease), lumbar 11/30/2011  . Fungal dermatitis 11/15/2011  . Retroperitoneal lymphadenopathy 10/19/2011  . Frequent urination 04/13/2011  . Sleep apnea   . Migraine headache   . Shortness of breath 12/15/2010  . Hot flashes 12/15/2010  . Uvulitis 11/25/2010  . [name] (obstructive sleep apnea) 11/16/2010  . Sore throat 09/22/2010  . Elevated blood sugar 08/23/2010  . Tongue mass 08/08/2010  . OTHER ABNORMAL BLOOD CHEMISTRY 05/12/2010  . SINUSITIS - ACUTE-NOS 12/31/2009  . OTHER NONSPECIFIC ABNORMAL SERUM ENZYME LEVELS 12/31/2009  . HYPERGLYCEMIA, FASTING 10/08/2009  . CONTRACTURE OF TENDON 08/17/2009  . URI 06/30/2009  . PARESTHESIA, HANDS 12/23/2008  . RHINITIS 11/24/2008  . LEG PAIN, BILATERAL 11/05/2008  . SKIN LESIONS, MULTIPLE 09/19/2008  . FATIGUE 09/19/2008  . HEADACHE 08/13/2008  . NAUSEA 08/13/2008  . INGROWN TOENAIL, INFECTED 06/17/2008  . PARONYCHIA, RIGHT GREAT TOE 06/11/2008  . ADVERSE DRUG REACTION 04/03/2008  . PERIPHERAL EDEMA 03/26/2008  . Morbid obesity ([place]) 01/03/2007  . Hyperlipidemia 09/22/2006  . JOINT PAIN, LOWER LEG 09/22/2006  . FIBROMYALGIA 09/20/2006  . ANXIETY 06/28/2006  . Essential hypertension 06/28/2006    Ansel Bong 01/11/2017, 10:29 AM  [place] MAIN [facility] SERVICES [address] [place], [place], [address] Phone: [phone]   Fax:  [phone]  Name: MARILENA TREVATHAN MRN: [id number] Date of Birth: [birth year]

## 2017-01-11 NOTE — Therapy (Deleted)
[place] MAIN [facility] SERVICES [address] [place], [place], [address] Phone: [phone]   Fax:  [phone]  Occupational Therapy Evaluation  Patient Details  Name: [name] MRN: [id number] Date of Birth: 1971-04-15 Referring Provider: Loleta Books, MD  Encounter Date: 01/05/2017      OT End of Session - 01/10/17 0824    Visit Number 1   Number of Visits 36   Date for OT Re-Evaluation 04/05/17   OT Start Time 0200   OT Stop Time 0300   OT Time Calculation (min) 60 min   Activity Tolerance Patient tolerated treatment well;No increased pain;Patient limited by pain   Behavior During Therapy [place] for tasks assessed/performed      Past Medical History:  Diagnosis Date  . Allergic rhinitis   . Ankle fracture, right   . Anxiety   . Carpal tunnel syndrome   . Cervical cancer ([place]) 1998  . Fibromyalgia   . Hyperlipidemia   . Hypertension   . Migraine headache   . Obesity   . Sleep apnea     Past Surgical History:  Procedure Laterality Date  . ABDOMINAL HYSTERECTOMY  10/1996  . ABDOMINAL SURGERY  jan/11/2012   gastric bypass surgery  . CARPAL TUNNEL RELEASE     bilateral   . CESAREAN SECTION    . CHOLECYSTECTOMY  07/1992  . ESOPHAGOGASTRODUODENOSCOPY N/A 01/14/2016   Procedure: ESOPHAGOGASTRODUODENOSCOPY (EGD);  Surgeon: Mauri Pole, MD;  Location: [facility] ENDOSCOPY;  Service: Endoscopy;  Laterality: N/A;    There were no vitals filed for this visit.           LYMPHEDEMA/ONCOLOGY QUESTIONNAIRE - 01/10/17 0849      Lymphedema Assessments   Lymphedema Assessments Lower extremities     Right Lower Extremity Lymphedema   Other BLE comparative limb volumetrics TBA at 1st Rx                             OT Long Term Goals - 01/10/17 0826      OT LONG TERM GOAL #1   Title Pt independent w/ lymphedema precautions and prevention principals and strategies to limit LE progression and infection  risk.   Baseline dependent   Time 2   Period Weeks   Status New   Target Date --  2 weeks after OT rx start date     OT LONG TERM GOAL #2   Title Lymphedema (LE) management/ self-care: Pt able to apply knee length, multi layered, gradient compression wraps with maximum caregiver assistance using proper techniques within 2 weeks to achieve optimal limb volume reductions bilaterally.   Baseline dependent   Time 2   Period Weeks   Status New   Target Date --  2 weeks after OT rx start date     OT LONG TERM GOAL #3   Title Lymphedema (LE) management/ self-care:  Pt to achieve at least 10% limb volume reduction  bilaterally below the knees during Intensive Phase CDT to limit progression, to reduce leg pain, to improve ADLs performance, and to facilitate safe functional mobility and ambulation.   Baseline dependent   Time 12   Period Weeks   Status New   Target Date 04/05/17     OT LONG TERM GOAL #4   Title Lymphedema (LE) management/ self-care:  Pt to tolerate daily compression wraps, compression garments and/ or HOS devices in keeping w/ prescribed wear regime within 1  week of issue date of each to progress and retain clinical and functional gains and to limit LE progression.   Baseline dependent   Time 12   Period Weeks   Status New   Target Date 04/05/17     OT LONG TERM GOAL #5   Title Lymphedema (LE) management/ self-care:  During Management Phase CDT Pt to sustain current limb volumes within 5%, and all other clinical gains achieved during OT treatment with needed level of caregiver assistance to limit LE progression, infection risk and further functional decline.   Baseline dependent   Time 6   Period Months   Status New   Target Date 07/09/17               Plan - 01/10/17 0851    Clinical Impression Statement Breiona Macias is a 46 y.o. female presenting with mild, stage II, BLE lymphedema (LE) with onset in 2004 secondary to suspected venous insufficiency, long  standing vasculitis, obserity, and dependent positioning w/ sedentary lifestyle. Pt's condition has worsened over time. She has not undergone LE treatment previously and does not tolerate off-the-shelf compression garments due to poor fit. Leg swelling and  limit Pt;'s functional performance in all occupational domains, including basic and instrumental ADLs, productive activities, leisure pusuits and social participation. Without skilled  Occupational Therapy coe Complete Decongestive Therapy (CDT) to control and decrease swelling and reduce associated pain, Pt's condition iwill worsen and further functional decline is expected.    Occupational performance deficits (Please refer to evaluation for details): ADL's;IADL's;Rest and Sleep;Work;Leisure;Social Participation;Other  limits funtional ambulation and transfers; limits body image   Rehab Potential Good   OT Frequency 3x / week   OT Duration 12 weeks   OT Treatment/Interventions Self-care/ADL training;Therapeutic exercise;Patient/family education;Manual Therapy;Manual lymph drainage;Therapeutic exercises;Other (comment);DME and/or AE instruction;Compression bandaging;Therapeutic activities  skin care   Clinical Decision Making Several treatment options, min-mod task modification necessary   OT Home Exercise Plan BLE lymphatic pumping ther ex- 2 sets of 10 each in sequence- 2 x q day   Recommended Other Services fit with BLE custom , ccl 2 or 3, flat knit compression knee highs, toe caps, and HOS devices to limit fibrosis formation during HOS. Pt may need financial assistance with garments as Medicare does not cover these.   Consulted and Agree with Plan of Care Patient      Patient will benefit from skilled therapeutic intervention in order to improve the following deficits and impairments:  Abnormal gait, Decreased skin integrity, Decreased knowledge of precautions, Decreased activity tolerance, Decreased knowledge of use of DME, Decreased  mobility, Difficulty walking, Obesity, Decreased range of motion, Increased edema, Pain, Impaired flexibility  Visit Diagnosis: Lymphedema, not elsewhere classified - Plan: Ot plan of care cert/re-cert    Problem List Patient Active Problem List   Diagnosis Date Noted  . Chronic narcotic use 09/07/2016  . BRBPR (bright red blood per rectum) 01/14/2016  . Acute blood loss anemia 01/14/2016  . Melena   . Multiple gastric ulcers   . PAN (polyarteritis nodosa) ([place]) 11/24/2015  . Flank pain 06/14/2015  . Chronic gout of multiple sites 02/25/2014  . GERD (gastroesophageal reflux disease) 01/08/2014  . Abdominal bloating 01/08/2014  . Routine general medical examination at a health care facility 04/23/2013  . Leg pain, lateral 12/11/2012  . Bariatric surgery status 10/31/2012  . Tinea corporis 09/25/2012  . Psoriasis 01/09/2012  . DDD (degenerative disc disease), lumbar 11/30/2011  . Fungal dermatitis 11/15/2011  . Retroperitoneal lymphadenopathy 10/19/2011  .  Frequent urination 04/13/2011  . Sleep apnea   . Migraine headache   . Shortness of breath 12/15/2010  . Hot flashes 12/15/2010  . Uvulitis 11/25/2010  . [name] (obstructive sleep apnea) 11/16/2010  . Sore throat 09/22/2010  . Elevated blood sugar 08/23/2010  . Tongue mass 08/08/2010  . OTHER ABNORMAL BLOOD CHEMISTRY 05/12/2010  . SINUSITIS - ACUTE-NOS 12/31/2009  . OTHER NONSPECIFIC ABNORMAL SERUM ENZYME LEVELS 12/31/2009  . HYPERGLYCEMIA, FASTING 10/08/2009  . CONTRACTURE OF TENDON 08/17/2009  . URI 06/30/2009  . PARESTHESIA, HANDS 12/23/2008  . RHINITIS 11/24/2008  . LEG PAIN, BILATERAL 11/05/2008  . SKIN LESIONS, MULTIPLE 09/19/2008  . FATIGUE 09/19/2008  . HEADACHE 08/13/2008  . NAUSEA 08/13/2008  . INGROWN TOENAIL, INFECTED 06/17/2008  . PARONYCHIA, RIGHT GREAT TOE 06/11/2008  . ADVERSE DRUG REACTION 04/03/2008  . PERIPHERAL EDEMA 03/26/2008  . Morbid obesity ([place]) 01/03/2007  . Hyperlipidemia 09/22/2006  .  JOINT PAIN, LOWER LEG 09/22/2006  . FIBROMYALGIA 09/20/2006  . ANXIETY 06/28/2006  . Essential hypertension 06/28/2006    Andrey Spearman, MS, OTR/L, [facility] 01/11/17 10:20 AM  [place] MAIN [facility] SERVICES [address] [place], [place], [address] Phone: [phone]   Fax:  [phone]  Name: LAURAANN MISSEY MRN: [id number] Date of Birth: 1970-06-20

## 2017-01-11 NOTE — Telephone Encounter (Signed)
Orders have been received and placed, LM for pt to return call for scheduling.

## 2017-01-11 NOTE — Therapy (Signed)
[place] MAIN [facility] SERVICES [address] [place], [place], [address] Phone: [phone]   Fax:  [phone]  Occupational Therapy Evaluation  Patient Details  Name: [name] MRN: [id number] Date of Birth: 04-Jul-1970 Referring Provider: Loleta Books, MD  Encounter Date: 01/05/2017      OT End of Session - 01/10/17 0824    Visit Number 1   Number of Visits 36   Date for OT Re-Evaluation 04/05/17   OT Start Time 0200   OT Stop Time 0300   OT Time Calculation (min) 60 min   Activity Tolerance Patient tolerated treatment well;No increased pain;Patient limited by pain   Behavior During Therapy [facility] for tasks assessed/performed      Past Medical History:  Diagnosis Date  . Allergic rhinitis   . Ankle fracture, right   . Anxiety   . Carpal tunnel syndrome   . Cervical cancer ([place]) 1998  . Fibromyalgia   . Hyperlipidemia   . Hypertension   . Migraine headache   . Obesity   . Sleep apnea     Past Surgical History:  Procedure Laterality Date  . ABDOMINAL HYSTERECTOMY  10/1996  . ABDOMINAL SURGERY  jan/11/2012   gastric bypass surgery  . CARPAL TUNNEL RELEASE     bilateral   . CESAREAN SECTION    . CHOLECYSTECTOMY  07/1992  . ESOPHAGOGASTRODUODENOSCOPY N/A 01/14/2016   Procedure: ESOPHAGOGASTRODUODENOSCOPY (EGD);  Surgeon: Mauri Pole, MD;  Location: [facility] ENDOSCOPY;  Service: Endoscopy;  Laterality: N/A;    There were no vitals filed for this visit.           LYMPHEDEMA/ONCOLOGY QUESTIONNAIRE - 01/10/17 0849      Lymphedema Assessments   Lymphedema Assessments Lower extremities     Right Lower Extremity Lymphedema   Other BLE comparative limb volumetrics TBA at 1st Rx                             OT Long Term Goals - 01/10/17 0826      OT LONG TERM GOAL #1   Title Pt independent w/ lymphedema precautions and prevention principals and strategies to limit LE progression and infection  risk.   Baseline dependent   Time 2   Period Weeks   Status New   Target Date --  2 weeks after OT rx start date     OT LONG TERM GOAL #2   Title Lymphedema (LE) management/ self-care: Pt able to apply knee length, multi layered, gradient compression wraps with maximum caregiver assistance using proper techniques within 2 weeks to achieve optimal limb volume reductions bilaterally.   Baseline dependent   Time 2   Period Weeks   Status New   Target Date --  2 weeks after OT rx start date     OT LONG TERM GOAL #3   Title Lymphedema (LE) management/ self-care:  Pt to achieve at least 10% limb volume reduction  bilaterally below the knees during Intensive Phase CDT to limit progression, to reduce leg pain, to improve ADLs performance, and to facilitate safe functional mobility and ambulation.   Baseline dependent   Time 12   Period Weeks   Status New   Target Date 04/05/17     OT LONG TERM GOAL #4   Title Lymphedema (LE) management/ self-care:  Pt to tolerate daily compression wraps, compression garments and/ or HOS devices in keeping w/ prescribed wear regime within 1  week of issue date of each to progress and retain clinical and functional gains and to limit LE progression.   Baseline dependent   Time 12   Period Weeks   Status New   Target Date 04/05/17     OT LONG TERM GOAL #5   Title Lymphedema (LE) management/ self-care:  During Management Phase CDT Pt to sustain current limb volumes within 5%, and all other clinical gains achieved during OT treatment with needed level of caregiver assistance to limit LE progression, infection risk and further functional decline.   Baseline dependent   Time 6   Period Months   Status New   Target Date 07/09/17               Plan - 01/10/17 0851    Clinical Impression Statement [name] is a 46 y.o. female presenting with mild, stage II, BLE lymphedema (LE) with onset in 2004 secondary to suspected venous insufficiency, long  standing vasculitis, obserity, and dependent positioning w/ sedentary lifestyle. Pt's condition has worsened over time. She has not undergone LE treatment previously and does not tolerate off-the-shelf compression garments due to poor fit. Leg swelling and  limit Pt;'s functional performance in all occupational domains, including basic and instrumental ADLs, productive activities, leisure pusuits and social participation. Without skilled  Occupational Therapy coe Complete Decongestive Therapy (CDT) to control and decrease swelling and reduce associated pain, Pt's condition iwill worsen and further functional decline is expected.    Occupational performance deficits (Please refer to evaluation for details): ADL's;IADL's;Rest and Sleep;Work;Leisure;Social Participation;Other  limits funtional ambulation and transfers; limits body image   Rehab Potential Good   OT Frequency 3x / week   OT Duration 12 weeks   OT Treatment/Interventions Self-care/ADL training;Therapeutic exercise;Patient/family education;Manual Therapy;Manual lymph drainage;Therapeutic exercises;Other (comment);DME and/or AE instruction;Compression bandaging;Therapeutic activities  skin care   Clinical Decision Making Several treatment options, min-mod task modification necessary   OT Home Exercise Plan BLE lymphatic pumping ther ex- 2 sets of 10 each in sequence- 2 x q day   Recommended Other Services fit with BLE custom , ccl 2 or 3, flat knit compression knee highs, toe caps, and HOS devices to limit fibrosis formation during HOS. Pt may need financial assistance with garments as Medicare does not cover these.   Consulted and Agree with Plan of Care Patient      Patient will benefit from skilled therapeutic intervention in order to improve the following deficits and impairments:  Abnormal gait, Decreased skin integrity, Decreased knowledge of precautions, Decreased activity tolerance, Decreased knowledge of use of DME, Decreased  mobility, Difficulty walking, Obesity, Decreased range of motion, Increased edema, Pain, Impaired flexibility  Visit Diagnosis: Lymphedema, not elsewhere classified - Plan: Ot plan of care cert/re-cert    Problem List Patient Active Problem List   Diagnosis Date Noted  . Chronic narcotic use 09/07/2016  . BRBPR (bright red blood per rectum) 01/14/2016  . Acute blood loss anemia 01/14/2016  . Melena   . Multiple gastric ulcers   . PAN (polyarteritis nodosa) ([place]) 11/24/2015  . Flank pain 06/14/2015  . Chronic gout of multiple sites 02/25/2014  . GERD (gastroesophageal reflux disease) 01/08/2014  . Abdominal bloating 01/08/2014  . Routine general medical examination at a health care facility 04/23/2013  . Leg pain, lateral 12/11/2012  . Bariatric surgery status 10/31/2012  . Tinea corporis 09/25/2012  . Psoriasis 01/09/2012  . DDD (degenerative disc disease), lumbar 11/30/2011  . Fungal dermatitis 11/15/2011  . Retroperitoneal lymphadenopathy 10/19/2011  .  Frequent urination 04/13/2011  . Sleep apnea   . Migraine headache   . Shortness of breath 12/15/2010  . Hot flashes 12/15/2010  . Uvulitis 11/25/2010  . [name] (obstructive sleep apnea) 11/16/2010  . Sore throat 09/22/2010  . Elevated blood sugar 08/23/2010  . Tongue mass 08/08/2010  . OTHER ABNORMAL BLOOD CHEMISTRY 05/12/2010  . SINUSITIS - ACUTE-NOS 12/31/2009  . OTHER NONSPECIFIC ABNORMAL SERUM ENZYME LEVELS 12/31/2009  . HYPERGLYCEMIA, FASTING 10/08/2009  . CONTRACTURE OF TENDON 08/17/2009  . URI 06/30/2009  . PARESTHESIA, HANDS 12/23/2008  . RHINITIS 11/24/2008  . LEG PAIN, BILATERAL 11/05/2008  . SKIN LESIONS, MULTIPLE 09/19/2008  . FATIGUE 09/19/2008  . HEADACHE 08/13/2008  . NAUSEA 08/13/2008  . INGROWN TOENAIL, INFECTED 06/17/2008  . PARONYCHIA, RIGHT GREAT TOE 06/11/2008  . ADVERSE DRUG REACTION 04/03/2008  . PERIPHERAL EDEMA 03/26/2008  . Morbid obesity ([place]) 01/03/2007  . Hyperlipidemia 09/22/2006  .  JOINT PAIN, LOWER LEG 09/22/2006  . FIBROMYALGIA 09/20/2006  . ANXIETY 06/28/2006  . Essential hypertension 06/28/2006    Ansel Bong 01/11/2017, 10:35 AM  [place] MAIN [facility] SERVICES [address] [place], [place], [address] Phone: [phone]   Fax:  [phone]  Name: [name] MRN: [id number] Date of Birth: 01-Mar-1971

## 2017-01-17 ENCOUNTER — Encounter: Payer: Self-pay | Admitting: Family Medicine

## 2017-01-17 ENCOUNTER — Other Ambulatory Visit: Payer: Self-pay | Admitting: General Practice

## 2017-01-17 ENCOUNTER — Ambulatory Visit: Payer: Medicare Other | Admitting: Occupational Therapy

## 2017-01-17 MED ORDER — ROSUVASTATIN CALCIUM 20 MG PO TABS: 20.0000 mg | ORAL_TABLET | Freq: Every day | ORAL | 1 refills | Status: DC

## 2017-01-18 MED ORDER — FLUCONAZOLE 150 MG PO TABS: 150.0000 mg | ORAL_TABLET | Freq: Once | ORAL | 0 refills | Status: AC

## 2017-01-19 ENCOUNTER — Other Ambulatory Visit: Payer: Self-pay | Admitting: Family Medicine

## 2017-01-19 ENCOUNTER — Encounter: Payer: Self-pay | Admitting: Family Medicine

## 2017-01-19 MED ORDER — HYDROCODONE-ACETAMINOPHEN 10-325 MG PO TABS: 1.0000 | ORAL_TABLET | Freq: Four times a day (QID) | ORAL | 0 refills | Status: DC | PRN

## 2017-01-19 NOTE — Telephone Encounter (Signed)
Last OV 01/06/17 Hydrocodone last filled 12/21/16 #90 with 0

## 2017-01-23 ENCOUNTER — Ambulatory Visit: Admission: RE | Admit: 2017-01-23 | Discharge: 2017-01-23 | Payer: MEDICARE

## 2017-01-23 ENCOUNTER — Other Ambulatory Visit: Payer: Self-pay | Admitting: General Practice

## 2017-01-23 DIAGNOSIS — D485 Neoplasm of uncertain behavior of skin: Principal | ICD-10-CM

## 2017-01-23 DIAGNOSIS — R748 Abnormal levels of other serum enzymes: Secondary | ICD-10-CM

## 2017-01-23 DIAGNOSIS — L986 Other infiltrative disorders of the skin and subcutaneous tissue: Secondary | ICD-10-CM | POA: Diagnosis not present

## 2017-01-23 DIAGNOSIS — I776 Arteritis, unspecified: Secondary | ICD-10-CM | POA: Diagnosis not present

## 2017-02-01 ENCOUNTER — Telehealth: Payer: Self-pay | Admitting: Family Medicine

## 2017-02-01 ENCOUNTER — Other Ambulatory Visit: Payer: Self-pay | Admitting: Family Medicine

## 2017-02-01 ENCOUNTER — Ambulatory Visit: Payer: Medicare Other | Admitting: Occupational Therapy

## 2017-02-01 DIAGNOSIS — Z79899 Other long term (current) drug therapy: Secondary | ICD-10-CM

## 2017-02-01 NOTE — Telephone Encounter (Signed)
Appointment has been scheduled for 02/13/17 at 2:30 pm

## 2017-02-01 NOTE — Telephone Encounter (Signed)
Ok to schedule.

## 2017-02-01 NOTE — Telephone Encounter (Signed)
Patient calling to scheduled lab appt.  She states pcp should have received order from [facility] which should have included 2 tests, 1 for gamma GT and she is unsure of the name for the other test.  Patient requesting to have other order entered so she can schedule lab appt.

## 2017-02-01 NOTE — Telephone Encounter (Signed)
These orders are both placed. Ok to schedule pt.

## 2017-02-06 ENCOUNTER — Encounter: Payer: Medicare Other | Admitting: Occupational Therapy

## 2017-02-06 ENCOUNTER — Encounter: Payer: Self-pay | Admitting: Family Medicine

## 2017-02-07 ENCOUNTER — Encounter: Payer: Medicare Other | Admitting: Occupational Therapy

## 2017-02-07 ENCOUNTER — Encounter: Payer: Self-pay | Admitting: Family Medicine

## 2017-02-08 ENCOUNTER — Encounter: Payer: Medicare Other | Admitting: Occupational Therapy

## 2017-02-10 ENCOUNTER — Encounter: Payer: Self-pay | Admitting: Family Medicine

## 2017-02-10 ENCOUNTER — Other Ambulatory Visit: Payer: Self-pay | Admitting: General Practice

## 2017-02-10 MED ORDER — HYDROCODONE-ACETAMINOPHEN 10-325 MG PO TABS: 1.0000 | ORAL_TABLET | Freq: Four times a day (QID) | ORAL | 0 refills | Status: DC | PRN

## 2017-02-10 NOTE — Telephone Encounter (Signed)
Last OV 01/06/17 Hydrocodone last filled 01/19/17 #90 with 0

## 2017-02-10 NOTE — Telephone Encounter (Signed)
rx placed at front desk for pick up. Pt aware.

## 2017-02-13 ENCOUNTER — Encounter: Payer: Self-pay | Admitting: Family Medicine

## 2017-02-13 ENCOUNTER — Encounter: Payer: Medicare Other | Admitting: Occupational Therapy

## 2017-02-13 ENCOUNTER — Other Ambulatory Visit: Payer: Self-pay | Admitting: Family Medicine

## 2017-02-13 ENCOUNTER — Other Ambulatory Visit (INDEPENDENT_AMBULATORY_CARE_PROVIDER_SITE_OTHER): Payer: Medicare Other

## 2017-02-13 DIAGNOSIS — R748 Abnormal levels of other serum enzymes: Secondary | ICD-10-CM

## 2017-02-13 DIAGNOSIS — Z79899 Other long term (current) drug therapy: Secondary | ICD-10-CM

## 2017-02-13 DIAGNOSIS — Z23 Encounter for immunization: Secondary | ICD-10-CM | POA: Diagnosis not present

## 2017-02-13 LAB — CBC WITH DIFFERENTIAL/PLATELET
Basophils Absolute: 0.1 10*3/uL (ref 0.0–0.1)
Basophils Relative: 0.6 % (ref 0.0–3.0)
Eosinophils Absolute: 0.1 10*3/uL (ref 0.0–0.7)
Eosinophils Relative: 0.5 % (ref 0.0–5.0)
HEMATOCRIT: 40.7 % (ref 36.0–46.0)
HEMOGLOBIN: 12.8 g/dL (ref 12.0–15.0)
LYMPHS PCT: 15.5 % (ref 12.0–46.0)
Lymphs Abs: 1.7 10*3/uL (ref 0.7–4.0)
MCHC: 31.6 g/dL (ref 30.0–36.0)
MCV: 87.5 fl (ref 78.0–100.0)
Monocytes Absolute: 0.5 10*3/uL (ref 0.1–1.0)
Monocytes Relative: 4.9 % (ref 3.0–12.0)
NEUTROS ABS: 8.9 10*3/uL — AB (ref 1.4–7.7)
Neutrophils Relative %: 78.5 % — ABNORMAL HIGH (ref 43.0–77.0)
PLATELETS: 410 10*3/uL — AB (ref 150.0–400.0)
RBC: 4.65 Mil/uL (ref 3.87–5.11)
RDW: 16.5 % — AB (ref 11.5–15.5)
WBC: 11.3 10*3/uL — AB (ref 4.0–10.5)

## 2017-02-13 LAB — GAMMA GT: GGT: 175 U/L — ABNORMAL HIGH (ref 7–51)

## 2017-02-13 NOTE — Telephone Encounter (Signed)
Last OV 01/06/17 Alprazolam last filled 09/20/16 #90 with 3

## 2017-02-13 NOTE — Telephone Encounter (Signed)
Medication filled to pharmacy as requested.   

## 2017-02-14 ENCOUNTER — Encounter: Payer: Medicare Other | Admitting: Occupational Therapy

## 2017-02-16 ENCOUNTER — Ambulatory Visit: Admission: RE | Admit: 2017-02-16 | Discharge: 2017-02-16 | Payer: MEDICARE | Admitting: Dermatology

## 2017-02-16 ENCOUNTER — Encounter: Payer: Medicare Other | Admitting: Occupational Therapy

## 2017-02-16 DIAGNOSIS — M300 Polyarteritis nodosa: Principal | ICD-10-CM

## 2017-02-17 ENCOUNTER — Encounter: Payer: Self-pay | Admitting: Family Medicine

## 2017-02-20 ENCOUNTER — Encounter: Payer: Medicare Other | Admitting: Occupational Therapy

## 2017-02-21 ENCOUNTER — Other Ambulatory Visit: Payer: Self-pay | Admitting: Family Medicine

## 2017-02-22 ENCOUNTER — Encounter: Payer: Medicare Other | Admitting: Occupational Therapy

## 2017-02-23 ENCOUNTER — Encounter: Payer: Medicare Other | Admitting: Occupational Therapy

## 2017-02-24 ENCOUNTER — Encounter: Payer: Self-pay | Admitting: Family Medicine

## 2017-02-27 ENCOUNTER — Other Ambulatory Visit: Payer: Self-pay | Admitting: Family Medicine

## 2017-02-27 ENCOUNTER — Encounter: Payer: Medicare Other | Admitting: Occupational Therapy

## 2017-02-28 ENCOUNTER — Encounter: Payer: Medicare Other | Admitting: Occupational Therapy

## 2017-02-28 MED ORDER — PREDNISONE 10 MG TABLET
ORAL_TABLET | Freq: Every day | ORAL | 0 refills | 0.00000 days | Status: CP
Start: 2017-02-28 — End: 2017-03-13

## 2017-03-02 ENCOUNTER — Ambulatory Visit
Admission: RE | Admit: 2017-03-02 | Discharge: 2017-03-02 | Disposition: A | Discharge status: Home / Self Care | Payer: MEDICARE | Admitting: Dermatology

## 2017-03-02 ENCOUNTER — Encounter: Payer: Medicare Other | Admitting: Occupational Therapy

## 2017-03-02 DIAGNOSIS — Z79899 Other long term (current) drug therapy: Secondary | ICD-10-CM

## 2017-03-02 DIAGNOSIS — L52 Erythema nodosum: Principal | ICD-10-CM

## 2017-03-02 DIAGNOSIS — I10 Essential (primary) hypertension: Secondary | ICD-10-CM | POA: Diagnosis not present

## 2017-03-02 MED ORDER — POTASSIUM IODIDE 1 GRAM/ML ORAL SOLUTION
0 refills | 0.00000 days | Status: CP
Start: 2017-03-02 — End: 2017-03-09

## 2017-03-06 ENCOUNTER — Encounter: Payer: Medicare Other | Admitting: Occupational Therapy

## 2017-03-06 DIAGNOSIS — F39 Unspecified mood [affective] disorder: Secondary | ICD-10-CM | POA: Diagnosis not present

## 2017-03-06 DIAGNOSIS — F518 Other sleep disorders not due to a substance or known physiological condition: Secondary | ICD-10-CM | POA: Diagnosis not present

## 2017-03-07 ENCOUNTER — Encounter: Payer: Medicare Other | Admitting: Occupational Therapy

## 2017-03-09 ENCOUNTER — Encounter: Payer: Medicare Other | Admitting: Occupational Therapy

## 2017-03-09 ENCOUNTER — Encounter: Payer: Self-pay | Admitting: Family Medicine

## 2017-03-09 MED ORDER — POTASSIUM IODIDE 1 GRAM/ML ORAL SOLUTION
4 refills | 0.00000 days | Status: CP
Start: 2017-03-09 — End: 2017-03-13

## 2017-03-10 ENCOUNTER — Other Ambulatory Visit: Payer: Self-pay | Admitting: Family Medicine

## 2017-03-11 ENCOUNTER — Other Ambulatory Visit: Payer: Self-pay | Admitting: Family Medicine

## 2017-03-11 ENCOUNTER — Encounter: Payer: Self-pay | Admitting: Family Medicine

## 2017-03-13 ENCOUNTER — Encounter: Payer: Medicare Other | Admitting: Occupational Therapy

## 2017-03-13 ENCOUNTER — Other Ambulatory Visit: Payer: Self-pay | Admitting: Emergency Medicine

## 2017-03-13 MED ORDER — HYDROCODONE-ACETAMINOPHEN 10-325 MG PO TABS: 1.0000 | ORAL_TABLET | Freq: Four times a day (QID) | ORAL | 0 refills | Status: DC | PRN

## 2017-03-13 MED ORDER — POTASSIUM IODIDE 1 GRAM/ML ORAL SOLUTION
4 refills | 0.00000 days | Status: CP
Start: 2017-03-13 — End: 2017-03-23

## 2017-03-13 MED ORDER — PREDNISONE 10 MG TABLET
ORAL_TABLET | ORAL | 0 refills | 0.00000 days | Status: CP
Start: 2017-03-13 — End: 2017-04-03

## 2017-03-13 NOTE — Telephone Encounter (Signed)
Indication for chronic opioid:Chronic Leg Pain Medication and dose: Norco 10/325 mg # pills per month: #90 Last UDS date: 01/25/16 Pain contract signed (Y/N): No Date narcotic database last reviewed (include red flags): 11/11/16 Last OV:01/06/17 Please advise

## 2017-03-13 NOTE — Addendum Note (Signed)
Addended by: Leonidas Romberg on: 03/13/2017 02:49 PM   Modules accepted: Orders

## 2017-03-14 ENCOUNTER — Other Ambulatory Visit: Payer: Self-pay | Admitting: Family Medicine

## 2017-03-14 ENCOUNTER — Encounter: Payer: Medicare Other | Admitting: Occupational Therapy

## 2017-03-14 DIAGNOSIS — N6320 Unspecified lump in the left breast, unspecified quadrant: Secondary | ICD-10-CM

## 2017-03-16 ENCOUNTER — Ambulatory Visit: Admission: RE | Admit: 2017-03-16 | Discharge: 2017-03-16 | Admitting: Ophthalmology

## 2017-03-16 ENCOUNTER — Ambulatory Visit: Admission: RE | Admit: 2017-03-16 | Discharge: 2017-03-16 | Payer: MEDICARE | Admitting: Medical

## 2017-03-16 ENCOUNTER — Encounter: Payer: Medicare Other | Admitting: Occupational Therapy

## 2017-03-16 DIAGNOSIS — Z79899 Other long term (current) drug therapy: Principal | ICD-10-CM

## 2017-03-16 DIAGNOSIS — H04123 Dry eye syndrome of bilateral lacrimal glands: Secondary | ICD-10-CM

## 2017-03-16 DIAGNOSIS — M300 Polyarteritis nodosa: Secondary | ICD-10-CM

## 2017-03-16 DIAGNOSIS — H527 Unspecified disorder of refraction: Secondary | ICD-10-CM

## 2017-03-16 DIAGNOSIS — H43393 Other vitreous opacities, bilateral: Secondary | ICD-10-CM | POA: Diagnosis not present

## 2017-03-20 ENCOUNTER — Encounter: Payer: Medicare Other | Admitting: Occupational Therapy

## 2017-03-21 ENCOUNTER — Encounter: Payer: Medicare Other | Admitting: Occupational Therapy

## 2017-03-23 ENCOUNTER — Encounter: Payer: Medicare Other | Admitting: Occupational Therapy

## 2017-03-23 ENCOUNTER — Encounter: Payer: Self-pay | Admitting: Family Medicine

## 2017-03-23 MED ORDER — NYSTATIN 100000 UNIT/GM EX CREA: 1.0000 "application " | TOPICAL_CREAM | Freq: Two times a day (BID) | CUTANEOUS | 1 refills | Status: DC

## 2017-03-23 MED ORDER — POTASSIUM IODIDE 1 GRAM/ML ORAL SOLUTION
1 refills | 0.00000 days | Status: CP
Start: 2017-03-23 — End: ?

## 2017-03-23 MED ORDER — PREDNISONE 10 MG TABLET
ORAL_TABLET | Freq: Every day | ORAL | 0 refills | 0.00000 days | Status: CP
Start: 2017-03-23 — End: 2017-04-02

## 2017-03-24 ENCOUNTER — Other Ambulatory Visit: Payer: Medicare Other

## 2017-03-27 ENCOUNTER — Encounter: Payer: Medicare Other | Admitting: Occupational Therapy

## 2017-03-28 ENCOUNTER — Encounter: Payer: Medicare Other | Admitting: Occupational Therapy

## 2017-03-29 ENCOUNTER — Ambulatory Visit
Admission: RE | Admit: 2017-03-29 | Discharge: 2017-03-29 | Disposition: A | Discharge status: Home / Self Care | Payer: MEDICARE

## 2017-03-29 DIAGNOSIS — I776 Arteritis, unspecified: Principal | ICD-10-CM

## 2017-03-30 ENCOUNTER — Encounter: Payer: Medicare Other | Admitting: Occupational Therapy

## 2017-03-30 ENCOUNTER — Encounter: Payer: Self-pay | Admitting: Family Medicine

## 2017-04-03 ENCOUNTER — Encounter: Payer: Medicare Other | Admitting: Occupational Therapy

## 2017-04-03 ENCOUNTER — Encounter: Payer: Self-pay | Admitting: Family Medicine

## 2017-04-03 ENCOUNTER — Other Ambulatory Visit: Payer: Self-pay

## 2017-04-03 ENCOUNTER — Ambulatory Visit (INDEPENDENT_AMBULATORY_CARE_PROVIDER_SITE_OTHER): Payer: Medicare Other | Admitting: Family Medicine

## 2017-04-03 VITALS — BP 131/83 | HR 79 | Resp 16 | Ht 62.0 in | Wt 368.4 lb

## 2017-04-03 DIAGNOSIS — H6122 Impacted cerumen, left ear: Secondary | ICD-10-CM | POA: Diagnosis not present

## 2017-04-03 DIAGNOSIS — Z7952 Long term (current) use of systemic steroids: Secondary | ICD-10-CM | POA: Diagnosis not present

## 2017-04-03 DIAGNOSIS — F1190 Opioid use, unspecified, uncomplicated: Secondary | ICD-10-CM

## 2017-04-03 MED ORDER — PREDNISONE 10 MG TABLET
ORAL_TABLET | ORAL | 0 refills | 0.00000 days | Status: CP
Start: 2017-04-03 — End: 2017-04-26

## 2017-04-03 NOTE — Assessment & Plan Note (Signed)
Due for UDS.  Reviewed and printed database information- no red flags.  Will continue to fill prescriptions at this time.

## 2017-04-03 NOTE — Progress Notes (Signed)
   Subjective:    Patient ID: [name], female    DOB: 01/27/1971, 46 y.o.   MRN: [id number]  HPI Chronic steroid use- pt has been on steroids for ~2 yrs due to ongoing PAN.  Derm and Rheum at [facility] recommended that she start bone protection.  Taking daily Calcium and Vit D.  Chronic narcotic use-  Indication for chronic opioid: chronic joint pain Medication and dose: Hydrocodone 10/325mg  q6 prn # pills per month: 90 Last UDS date: 01/31/16 Pain contract signed (Y/N): yes Date narcotic database last reviewed (include red flags): 04/03/17- no red flags.  Printed and placed for scanning into chart  L ear fullness- sxs started a few days ago.  No pain.  No drainage.  No fevers.  Review of Systems For ROS see HPI     Objective:   Physical Exam  Constitutional: She is oriented to person, place, and time. She appears well-developed and well-nourished. No distress.  Morbidly obese  HENT:  Head: Normocephalic and atraumatic.  R TM WNL L TM obscured by wax  Neurological: She is alert and oriented to person, place, and time.  Skin: Skin is warm and dry.  Psychiatric: She has a normal mood and affect. Her behavior is normal. Thought content normal.  Vitals reviewed.         Assessment & Plan:  Cerumen impaction- pt is not interested in removal of wax from L ear today.  Reviewed at home treatment w/ H2O2.  Pt expressed understanding and is in agreement w/ plan.

## 2017-04-03 NOTE — Patient Instructions (Addendum)
Follow up as needed or as scheduled We'll call you with your bone density appt Continue to work on healthy diet and regular exercise- you can do it! Use peroxide in the ear to dissolve the wax Call with any questions or concerns Happy Thanksgiving!!!

## 2017-04-03 NOTE — Assessment & Plan Note (Signed)
Pt has been on steroids for nearly 2 yrs.  At this time, it's worth getting a DEXA and looking into bone preservation.  Reviewed that if she has Osteopenia, we will start Fosamax 35mg  weekly.  If Osteoporosis, we have more options- including Prolia injxns.  Will await DEXA results before deciding on next steps.  Pt expressed understanding and is in agreement w/ plan.

## 2017-04-04 ENCOUNTER — Encounter: Payer: Medicare Other | Admitting: Occupational Therapy

## 2017-04-09 LAB — PAIN MGMT, PROFILE 8 W/CONF, U
6 ACETYLMORPHINE: NEGATIVE ng/mL (ref <10)
ALCOHOL METABOLITES: NEGATIVE ng/mL (ref <500)
ALPHAHYDROXYTRIAZOLAM: NEGATIVE ng/mL (ref <50)
AMPHETAMINES: NEGATIVE ng/mL (ref <500)
Alphahydroxyalprazolam: NEGATIVE ng/mL (ref <25)
Alphahydroxymidazolam: NEGATIVE ng/mL (ref <50)
Aminoclonazepam: NEGATIVE ng/mL (ref <25)
Benzodiazepines: NEGATIVE ng/mL (ref <100)
Buprenorphine, Urine: NEGATIVE ng/mL (ref <5)
CODEINE: NEGATIVE ng/mL (ref <50)
Cocaine Metabolite: NEGATIVE ng/mL (ref <150)
Creatinine: 47 mg/dL
Hydrocodone: 875 ng/mL — ABNORMAL HIGH (ref <50)
Hydromorphone: 60 ng/mL — ABNORMAL HIGH (ref <50)
Hydroxyethylflurazepam: NEGATIVE ng/mL (ref <50)
Lorazepam: NEGATIVE ng/mL (ref <50)
MDMA: NEGATIVE ng/mL (ref <500)
Marijuana Metabolite: NEGATIVE ng/mL (ref <20)
Morphine: NEGATIVE ng/mL (ref <50)
NORHYDROCODONE: 616 ng/mL — AB (ref <50)
Nordiazepam: NEGATIVE ng/mL (ref <50)
OPIATES: POSITIVE ng/mL — AB (ref <100)
OXAZEPAM: NEGATIVE ng/mL (ref <50)
OXIDANT: NEGATIVE ug/mL (ref <200)
Oxycodone: NEGATIVE ng/mL (ref <100)
PH: 7.16 (ref 4.5–9.0)
Temazepam: NEGATIVE ng/mL (ref <50)

## 2017-04-10 ENCOUNTER — Encounter: Payer: Medicare Other | Admitting: Occupational Therapy

## 2017-04-11 ENCOUNTER — Encounter: Payer: Medicare Other | Admitting: Occupational Therapy

## 2017-04-11 ENCOUNTER — Encounter: Payer: Self-pay | Admitting: Family Medicine

## 2017-04-11 ENCOUNTER — Other Ambulatory Visit: Payer: Self-pay | Admitting: Family Medicine

## 2017-04-11 NOTE — Telephone Encounter (Signed)
Medication filled to pharmacy as requested.   

## 2017-04-11 NOTE — Telephone Encounter (Signed)
Last OV 04/03/17 Alprazolam last filled 02/13/17 #90 with 1

## 2017-04-13 ENCOUNTER — Encounter: Payer: Medicare Other | Admitting: Occupational Therapy

## 2017-04-14 ENCOUNTER — Other Ambulatory Visit: Payer: Self-pay | Admitting: General Practice

## 2017-04-14 ENCOUNTER — Other Ambulatory Visit: Payer: Medicare Other

## 2017-04-14 MED ORDER — HYDROCODONE-ACETAMINOPHEN 10-325 MG PO TABS: 1.0000 | ORAL_TABLET | Freq: Four times a day (QID) | ORAL | 0 refills | Status: DC | PRN

## 2017-04-14 NOTE — Telephone Encounter (Signed)
Med given to pt today at family appt.

## 2017-04-14 NOTE — Telephone Encounter (Signed)
Last OV 04/03/17 Hydrocodone last filled 03/13/17 #90 with 0

## 2017-04-17 ENCOUNTER — Encounter: Payer: Medicare Other | Admitting: Occupational Therapy

## 2017-04-18 ENCOUNTER — Encounter: Payer: Medicare Other | Admitting: Occupational Therapy

## 2017-04-20 ENCOUNTER — Encounter: Payer: Medicare Other | Admitting: Occupational Therapy

## 2017-04-20 ENCOUNTER — Other Ambulatory Visit: Payer: Self-pay | Admitting: Family Medicine

## 2017-04-20 NOTE — Telephone Encounter (Signed)
Last OV 04/03/17 Nystatin last filled 03/23/17 30g with 1

## 2017-04-24 ENCOUNTER — Encounter: Payer: Medicare Other | Admitting: Occupational Therapy

## 2017-04-24 ENCOUNTER — Other Ambulatory Visit: Payer: Medicare Other

## 2017-04-24 ENCOUNTER — Other Ambulatory Visit: Payer: Self-pay | Admitting: Family Medicine

## 2017-04-25 ENCOUNTER — Other Ambulatory Visit: Payer: Self-pay | Admitting: Family Medicine

## 2017-04-25 ENCOUNTER — Encounter: Payer: Medicare Other | Admitting: Occupational Therapy

## 2017-04-26 ENCOUNTER — Ambulatory Visit
Admission: RE | Admit: 2017-04-26 | Discharge: 2017-04-26 | Disposition: A | Discharge status: Home / Self Care | Payer: MEDICARE | Admitting: Dermatology

## 2017-04-26 ENCOUNTER — Ambulatory Visit
Admission: RE | Admit: 2017-04-26 | Discharge: 2017-04-26 | Disposition: A | Discharge status: Home / Self Care | Payer: MEDICARE

## 2017-04-26 DIAGNOSIS — D8989 Other specified disorders involving the immune mechanism, not elsewhere classified: Secondary | ICD-10-CM

## 2017-04-26 DIAGNOSIS — R748 Abnormal levels of other serum enzymes: Secondary | ICD-10-CM

## 2017-04-26 DIAGNOSIS — M300 Polyarteritis nodosa: Principal | ICD-10-CM

## 2017-04-26 DIAGNOSIS — Z79899 Other long term (current) drug therapy: Secondary | ICD-10-CM

## 2017-04-26 DIAGNOSIS — Z886 Allergy status to analgesic agent status: Secondary | ICD-10-CM | POA: Diagnosis not present

## 2017-04-26 DIAGNOSIS — I10 Essential (primary) hypertension: Secondary | ICD-10-CM | POA: Diagnosis not present

## 2017-04-26 DIAGNOSIS — G4733 Obstructive sleep apnea (adult) (pediatric): Secondary | ICD-10-CM | POA: Diagnosis not present

## 2017-04-26 DIAGNOSIS — Z9049 Acquired absence of other specified parts of digestive tract: Secondary | ICD-10-CM | POA: Diagnosis not present

## 2017-04-26 DIAGNOSIS — M7989 Other specified soft tissue disorders: Secondary | ICD-10-CM | POA: Diagnosis not present

## 2017-04-26 DIAGNOSIS — Z7982 Long term (current) use of aspirin: Secondary | ICD-10-CM | POA: Diagnosis not present

## 2017-04-26 DIAGNOSIS — M797 Fibromyalgia: Secondary | ICD-10-CM | POA: Diagnosis not present

## 2017-04-26 DIAGNOSIS — I890 Lymphedema, not elsewhere classified: Secondary | ICD-10-CM | POA: Diagnosis not present

## 2017-04-26 DIAGNOSIS — E785 Hyperlipidemia, unspecified: Secondary | ICD-10-CM | POA: Diagnosis not present

## 2017-04-26 DIAGNOSIS — Z882 Allergy status to sulfonamides status: Secondary | ICD-10-CM | POA: Diagnosis not present

## 2017-04-26 DIAGNOSIS — Z9884 Bariatric surgery status: Secondary | ICD-10-CM | POA: Diagnosis not present

## 2017-04-26 MED ORDER — PREDNISONE 20 MG TABLET
ORAL_TABLET | Freq: Every day | ORAL | 0 refills | 0.00000 days | Status: CP
Start: 2017-04-26 — End: 2017-10-04

## 2017-04-27 ENCOUNTER — Encounter: Payer: Medicare Other | Admitting: Occupational Therapy

## 2017-05-04 ENCOUNTER — Inpatient Hospital Stay
Admission: RE | Admit: 2017-05-04 | Discharge: 2017-05-04 | Disposition: A | Discharge status: Home / Self Care | Payer: Medicare Other | Source: Ambulatory Visit | Attending: Family Medicine | Admitting: Family Medicine

## 2017-05-04 ENCOUNTER — Other Ambulatory Visit: Payer: Medicare Other

## 2017-05-07 ENCOUNTER — Encounter: Payer: Self-pay | Admitting: Family Medicine

## 2017-05-08 ENCOUNTER — Other Ambulatory Visit: Payer: Self-pay | Admitting: Family Medicine

## 2017-05-08 MED ORDER — HYDROCODONE-ACETAMINOPHEN 10-325 MG PO TABS: 1.0000 | ORAL_TABLET | Freq: Four times a day (QID) | ORAL | 0 refills | Status: DC | PRN

## 2017-05-08 NOTE — Telephone Encounter (Signed)
Last OV 04/03/17 Clonazepam last filled 01/02/17 #30 with 3 Hydrocodone last filled 04/14/17 #90 with 0

## 2017-05-08 NOTE — Telephone Encounter (Signed)
Medication filled to pharmacy as requested.   

## 2017-05-15 ENCOUNTER — Institutional Professional Consult (permissible substitution): Payer: Medicare Other | Admitting: Internal Medicine

## 2017-05-16 ENCOUNTER — Encounter: Payer: Self-pay | Admitting: Family Medicine

## 2017-05-24 ENCOUNTER — Ambulatory Visit: Admit: 2017-05-24 | Discharge: 2017-05-24 | Payer: MEDICARE

## 2017-05-24 ENCOUNTER — Encounter: Admit: 2017-05-24 | Discharge: 2017-05-24 | Payer: MEDICARE

## 2017-05-24 DIAGNOSIS — M7062 Trochanteric bursitis, left hip: Principal | ICD-10-CM

## 2017-05-24 DIAGNOSIS — W19XXXA Unspecified fall, initial encounter: Secondary | ICD-10-CM

## 2017-05-24 DIAGNOSIS — M300 Polyarteritis nodosa: Secondary | ICD-10-CM

## 2017-05-24 DIAGNOSIS — L409 Psoriasis, unspecified: Secondary | ICD-10-CM

## 2017-05-24 DIAGNOSIS — Z79899 Other long term (current) drug therapy: Secondary | ICD-10-CM | POA: Diagnosis not present

## 2017-05-24 DIAGNOSIS — M79606 Pain in leg, unspecified: Secondary | ICD-10-CM | POA: Diagnosis not present

## 2017-05-24 DIAGNOSIS — I10 Essential (primary) hypertension: Secondary | ICD-10-CM | POA: Diagnosis not present

## 2017-05-24 DIAGNOSIS — M797 Fibromyalgia: Secondary | ICD-10-CM | POA: Diagnosis not present

## 2017-05-24 DIAGNOSIS — Z9884 Bariatric surgery status: Secondary | ICD-10-CM | POA: Diagnosis not present

## 2017-05-24 DIAGNOSIS — L309 Dermatitis, unspecified: Secondary | ICD-10-CM | POA: Diagnosis not present

## 2017-05-24 DIAGNOSIS — Z90710 Acquired absence of both cervix and uterus: Secondary | ICD-10-CM | POA: Diagnosis not present

## 2017-05-24 DIAGNOSIS — L539 Erythematous condition, unspecified: Secondary | ICD-10-CM | POA: Diagnosis not present

## 2017-05-24 DIAGNOSIS — Z881 Allergy status to other antibiotic agents status: Secondary | ICD-10-CM | POA: Diagnosis not present

## 2017-05-24 DIAGNOSIS — F329 Major depressive disorder, single episode, unspecified: Secondary | ICD-10-CM | POA: Diagnosis not present

## 2017-05-24 DIAGNOSIS — Z886 Allergy status to analgesic agent status: Secondary | ICD-10-CM | POA: Diagnosis not present

## 2017-05-24 DIAGNOSIS — M8938 Hypertrophy of bone, other site: Secondary | ICD-10-CM | POA: Diagnosis not present

## 2017-05-24 DIAGNOSIS — Z9049 Acquired absence of other specified parts of digestive tract: Secondary | ICD-10-CM | POA: Diagnosis not present

## 2017-05-24 DIAGNOSIS — I890 Lymphedema, not elsewhere classified: Secondary | ICD-10-CM | POA: Diagnosis not present

## 2017-05-24 DIAGNOSIS — Z888 Allergy status to other drugs, medicaments and biological substances status: Secondary | ICD-10-CM | POA: Diagnosis not present

## 2017-05-24 DIAGNOSIS — Z8541 Personal history of malignant neoplasm of cervix uteri: Secondary | ICD-10-CM | POA: Diagnosis not present

## 2017-05-24 DIAGNOSIS — Z882 Allergy status to sulfonamides status: Secondary | ICD-10-CM | POA: Diagnosis not present

## 2017-05-24 DIAGNOSIS — Z7952 Long term (current) use of systemic steroids: Secondary | ICD-10-CM | POA: Diagnosis not present

## 2017-05-24 DIAGNOSIS — M1991 Primary osteoarthritis, unspecified site: Secondary | ICD-10-CM | POA: Diagnosis not present

## 2017-05-25 ENCOUNTER — Encounter: Admit: 2017-05-25 | Discharge: 2017-05-26 | Payer: MEDICARE

## 2017-05-25 DIAGNOSIS — M300 Polyarteritis nodosa: Principal | ICD-10-CM

## 2017-05-25 DIAGNOSIS — L409 Psoriasis, unspecified: Secondary | ICD-10-CM

## 2017-05-25 DIAGNOSIS — R748 Abnormal levels of other serum enzymes: Secondary | ICD-10-CM

## 2017-05-25 DIAGNOSIS — B353 Tinea pedis: Secondary | ICD-10-CM

## 2017-05-25 MED ORDER — HALOBETASOL PROPIONATE 0.05 % TOPICAL OINTMENT
OPHTHALMIC | 2 refills | 0.00000 days | Status: CP
Start: 2017-05-25 — End: 2017-09-15

## 2017-05-25 MED ORDER — PREDNISONE 5 MG TABLET
ORAL_TABLET | ORAL | 0 refills | 0.00000 days | Status: CP
Start: 2017-05-25 — End: 2017-05-30

## 2017-05-30 MED ORDER — PREDNISONE 5 MG TABLET
ORAL_TABLET | ORAL | 0 refills | 0.00000 days | Status: CP
Start: 2017-05-30 — End: 2017-07-05

## 2017-06-05 ENCOUNTER — Encounter: Payer: Self-pay | Admitting: General Practice

## 2017-06-05 ENCOUNTER — Ambulatory Visit
Admission: RE | Admit: 2017-06-05 | Discharge: 2017-06-05 | Disposition: A | Discharge status: Home / Self Care | Payer: Medicare Other | Source: Ambulatory Visit | Attending: Family Medicine | Admitting: Family Medicine

## 2017-06-05 ENCOUNTER — Other Ambulatory Visit: Payer: Self-pay | Admitting: Family Medicine

## 2017-06-05 DIAGNOSIS — Z7952 Long term (current) use of systemic steroids: Secondary | ICD-10-CM

## 2017-06-05 DIAGNOSIS — R928 Other abnormal and inconclusive findings on diagnostic imaging of breast: Secondary | ICD-10-CM | POA: Diagnosis not present

## 2017-06-05 DIAGNOSIS — N6320 Unspecified lump in the left breast, unspecified quadrant: Secondary | ICD-10-CM

## 2017-06-05 DIAGNOSIS — N6489 Other specified disorders of breast: Secondary | ICD-10-CM | POA: Diagnosis not present

## 2017-06-05 DIAGNOSIS — Z780 Asymptomatic menopausal state: Secondary | ICD-10-CM | POA: Diagnosis not present

## 2017-06-06 ENCOUNTER — Other Ambulatory Visit: Payer: Self-pay | Admitting: Family Medicine

## 2017-06-06 MED ORDER — CLONAZEPAM 1 MG PO TABS: 1.0000 mg | ORAL_TABLET | Freq: Every day | ORAL | 0 refills | Status: DC

## 2017-06-06 MED ORDER — FUROSEMIDE 40 MG PO TABS: ORAL_TABLET | ORAL | 3 refills | Status: DC

## 2017-06-06 MED ORDER — NEBIVOLOL HCL 10 MG PO TABS: 10.0000 mg | ORAL_TABLET | Freq: Every day | ORAL | 0 refills | Status: DC

## 2017-06-06 MED ORDER — CYCLOBENZAPRINE HCL 10 MG PO TABS: 10.0000 mg | ORAL_TABLET | Freq: Three times a day (TID) | ORAL | 0 refills | Status: DC | PRN

## 2017-06-06 MED ORDER — HYDROCODONE-ACETAMINOPHEN 10-325 MG PO TABS: 1.0000 | ORAL_TABLET | Freq: Four times a day (QID) | ORAL | 0 refills | Status: DC | PRN

## 2017-06-06 NOTE — Telephone Encounter (Signed)
Medication filled to pharmacy as requested.   

## 2017-06-06 NOTE — Telephone Encounter (Signed)
Last OV 04/03/17 Clonazepam last filled 05/08/17 #30 with 0 Hydrocodone last filled 05/08/17 #90 with 0

## 2017-06-06 NOTE — Telephone Encounter (Signed)
Last OV 04/03/17 Alprazolam last filled 04/11/17 #90 with 1

## 2017-06-12 ENCOUNTER — Institutional Professional Consult (permissible substitution): Payer: Medicare Other | Admitting: Pulmonary Disease

## 2017-06-16 ENCOUNTER — Encounter: Admit: 2017-06-16 | Discharge: 2017-06-17 | Payer: MEDICARE

## 2017-06-16 DIAGNOSIS — I890 Lymphedema, not elsewhere classified: Principal | ICD-10-CM

## 2017-06-19 DIAGNOSIS — M1712 Unilateral primary osteoarthritis, left knee: Secondary | ICD-10-CM | POA: Diagnosis not present

## 2017-06-19 MED ORDER — GABAPENTIN 600 MG TABLET
ORAL_TABLET | 3 refills | 0 days | Status: CP
Start: 2017-06-19 — End: ?

## 2017-06-27 ENCOUNTER — Other Ambulatory Visit: Payer: Self-pay | Admitting: Family Medicine

## 2017-07-01 ENCOUNTER — Other Ambulatory Visit: Payer: Self-pay | Admitting: Family Medicine

## 2017-07-03 ENCOUNTER — Other Ambulatory Visit: Payer: Self-pay | Admitting: Family Medicine

## 2017-07-03 MED ORDER — HYDROXYCHLOROQUINE 200 MG TABLET
ORAL_TABLET | 3 refills | 0 days | Status: CP
Start: 2017-07-03 — End: 2017-11-02

## 2017-07-03 NOTE — Telephone Encounter (Signed)
Please call pharmacy and ask what happened to her refill.  She was given #90 w/ 1 on 1/22 but according to controlled substance database there is no refill on this script.  If no refill, I am able to refill but I don't know what happened to the other 90 pills.

## 2017-07-03 NOTE — Telephone Encounter (Signed)
Last OV 04/03/17 Alprazolam last filled 06/06/17 #90 with 1

## 2017-07-03 NOTE — Telephone Encounter (Signed)
Last OV: 04/03/17 Last Refill:    Clonazepam 06/06/17, 30 with 0 Hydrocodone-acetaminophen 06/06/17, 90 with 0 refill.

## 2017-07-04 ENCOUNTER — Ambulatory Visit (INDEPENDENT_AMBULATORY_CARE_PROVIDER_SITE_OTHER): Payer: Medicare Other | Admitting: Family Medicine

## 2017-07-04 ENCOUNTER — Encounter: Payer: Self-pay | Admitting: Family Medicine

## 2017-07-04 ENCOUNTER — Other Ambulatory Visit: Payer: Self-pay

## 2017-07-04 VITALS — BP 130/80 | HR 82 | Temp 98.0°F | Resp 16 | Ht 62.0 in | Wt 385.1 lb

## 2017-07-04 DIAGNOSIS — I10 Essential (primary) hypertension: Secondary | ICD-10-CM

## 2017-07-04 DIAGNOSIS — E785 Hyperlipidemia, unspecified: Secondary | ICD-10-CM

## 2017-07-04 LAB — HEPATIC FUNCTION PANEL
ALBUMIN: 3.8 g/dL (ref 3.5–5.2)
ALT: 16 U/L (ref 0–35)
AST: 13 U/L (ref 0–37)
Alkaline Phosphatase: 105 U/L (ref 39–117)
BILIRUBIN TOTAL: 0.4 mg/dL (ref 0.2–1.2)
Bilirubin, Direct: 0.1 mg/dL (ref 0.0–0.3)
Total Protein: 6.5 g/dL (ref 6.0–8.3)

## 2017-07-04 LAB — LIPID PANEL
CHOL/HDL RATIO: 3
Cholesterol: 114 mg/dL (ref 0–200)
HDL: 41.9 mg/dL (ref >39.00)
LDL Cholesterol: 35 mg/dL (ref 0–99)
NONHDL: 71.78
TRIGLYCERIDES: 186 mg/dL — AB (ref 0.0–149.0)
VLDL: 37.2 mg/dL (ref 0.0–40.0)

## 2017-07-04 LAB — CBC WITH DIFFERENTIAL/PLATELET
BASOS PCT: 0.4 % (ref 0.0–3.0)
Basophils Absolute: 0 10*3/uL (ref 0.0–0.1)
EOS ABS: 0.2 10*3/uL (ref 0.0–0.7)
EOS PCT: 1.8 % (ref 0.0–5.0)
HEMATOCRIT: 39.1 % (ref 36.0–46.0)
HEMOGLOBIN: 12.7 g/dL (ref 12.0–15.0)
LYMPHS PCT: 11 % — AB (ref 12.0–46.0)
Lymphs Abs: 1.2 10*3/uL (ref 0.7–4.0)
MCHC: 32.4 g/dL (ref 30.0–36.0)
MCV: 85.1 fl (ref 78.0–100.0)
MONO ABS: 1 10*3/uL (ref 0.1–1.0)
Monocytes Relative: 9.5 % (ref 3.0–12.0)
Neutro Abs: 8.3 10*3/uL — ABNORMAL HIGH (ref 1.4–7.7)
Neutrophils Relative %: 77.3 % — ABNORMAL HIGH (ref 43.0–77.0)
Platelets: 273 10*3/uL (ref 150.0–400.0)
RBC: 4.6 Mil/uL (ref 3.87–5.11)
RDW: 16.2 % — AB (ref 11.5–15.5)
WBC: 10.7 10*3/uL — AB (ref 4.0–10.5)

## 2017-07-04 LAB — BASIC METABOLIC PANEL
BUN: 8 mg/dL (ref 6–23)
CALCIUM: 9 mg/dL (ref 8.4–10.5)
CO2: 34 mEq/L — ABNORMAL HIGH (ref 19–32)
CREATININE: 0.77 mg/dL (ref 0.40–1.20)
Chloride: 96 mEq/L (ref 96–112)
GFR: 85.19 mL/min (ref >60.00)
Glucose, Bld: 98 mg/dL (ref 70–99)
Potassium: 3.2 mEq/L — ABNORMAL LOW (ref 3.5–5.1)
Sodium: 137 mEq/L (ref 135–145)

## 2017-07-04 LAB — TSH: TSH: 2.99 u[IU]/mL (ref 0.35–4.50)

## 2017-07-04 MED ORDER — HYDROCODONE-ACETAMINOPHEN 10-325 MG PO TABS: 1.0000 | ORAL_TABLET | Freq: Four times a day (QID) | ORAL | 0 refills | Status: DC | PRN

## 2017-07-04 MED ORDER — CLONAZEPAM 1 MG PO TABS: 1.0000 mg | ORAL_TABLET | Freq: Every day | ORAL | 0 refills | Status: DC

## 2017-07-04 MED ORDER — FERROUS SULFATE 325 (65 FE) MG PO TABS: 325.0000 mg | ORAL_TABLET | Freq: Every day | ORAL | 1 refills | Status: DC

## 2017-07-04 MED ORDER — FUROSEMIDE 40 MG PO TABS: ORAL_TABLET | ORAL | 1 refills | Status: DC

## 2017-07-04 NOTE — Assessment & Plan Note (Signed)
Pt has gained nearly 20 more lbs since last visit.  BMI is now 70.4  Again reviewed need for healthy diet and regular exercise as her weight poses the biggest threat to her health.  Check labs to risk stratify.  Will follow.

## 2017-07-04 NOTE — Progress Notes (Signed)
   Subjective:    Patient ID: [name], female    DOB: 1970/07/05, 47 y.o.   MRN: [id number]  HPI HTN- chronic problem, on Bystolic 10mg  daily, Lasix 80mg  daily.  No CP, SOB above baselines, HAs, visual changes.  Hyperlipidemia- chronic problem, on Crestor 20mg  daily.  No abd pain, N/V.  Obesity- pt has gained another 17 lbs.  BMI is now 70.4.  Pt is on chronic steroids which doesn't help her weight issues but pt blames all weight gain on medication.   Review of Systems For ROS see HPI     Objective:   Physical Exam  Constitutional: She is oriented to person, place, and time. She appears well-developed and well-nourished. No distress.  Morbidly obese  HENT:  Head: Normocephalic and atraumatic.  Eyes: Conjunctivae and EOM are normal. Pupils are equal, round, and reactive to light.  Neck: Normal range of motion. Neck supple. No thyromegaly present.  Cardiovascular: Normal rate, regular rhythm, normal heart sounds and intact distal pulses.  No murmur heard. Pulmonary/Chest: Effort normal and breath sounds normal. No respiratory distress.  Abdominal: Soft. She exhibits no distension. There is no tenderness.  Musculoskeletal: She exhibits edema (1+ edema of feet bilaterally).  Lymphadenopathy:    She has no cervical adenopathy.  Neurological: She is alert and oriented to person, place, and time.  Skin: Skin is warm and dry.  Psychiatric: She has a normal mood and affect. Her behavior is normal.  Vitals reviewed.         Assessment & Plan:

## 2017-07-04 NOTE — Patient Instructions (Signed)
Schedule your Medicare Wellness Visit in 6 months and a BP and cholesterol follow up with me at the same time [facility] notify you of your lab results and make any changes if needed Continue to work on healthy diet and regular exercise- you can do it!!! Call with any questions or concerns Hang in there!!!

## 2017-07-04 NOTE — Assessment & Plan Note (Signed)
Chronic problem.  Adequate control on Bystolic and Lasix.  Stressed need for healthy diet and regular exercise as her weight is the biggest risk.  Check labs.  No anticipated med changes.

## 2017-07-04 NOTE — Assessment & Plan Note (Signed)
Chronic problem.  Tolerating statin w/o difficulty.  Check labs.  Adjust meds prn  

## 2017-07-05 MED ORDER — PREDNISONE 5 MG TABLET
ORAL_TABLET | ORAL | 0 refills | 0.00000 days | Status: CP
Start: 2017-07-05 — End: 2017-09-13

## 2017-07-10 ENCOUNTER — Institutional Professional Consult (permissible substitution): Payer: Medicare Other | Admitting: Pulmonary Disease

## 2017-07-14 ENCOUNTER — Encounter
Admit: 2017-07-14 | Discharge: 2017-08-12 | Payer: MEDICARE | Attending: Rehabilitative and Restorative Service Providers" | Primary: Rehabilitative and Restorative Service Providers"

## 2017-07-14 ENCOUNTER — Other Ambulatory Visit: Payer: Self-pay | Admitting: Family Medicine

## 2017-07-14 ENCOUNTER — Encounter: Payer: Self-pay | Admitting: Family Medicine

## 2017-07-14 DIAGNOSIS — I890 Lymphedema, not elsewhere classified: Secondary | ICD-10-CM

## 2017-07-14 DIAGNOSIS — E669 Obesity, unspecified: Secondary | ICD-10-CM | POA: Diagnosis not present

## 2017-07-14 DIAGNOSIS — R234 Changes in skin texture: Secondary | ICD-10-CM | POA: Diagnosis not present

## 2017-07-14 DIAGNOSIS — R2689 Other abnormalities of gait and mobility: Secondary | ICD-10-CM | POA: Diagnosis not present

## 2017-07-14 DIAGNOSIS — Z8619 Personal history of other infectious and parasitic diseases: Secondary | ICD-10-CM | POA: Diagnosis not present

## 2017-07-14 MED ORDER — NYSTATIN 100000 UNIT/GM EX POWD: Freq: Four times a day (QID) | CUTANEOUS | 3 refills | Status: DC

## 2017-07-18 ENCOUNTER — Encounter: Admit: 2017-07-18 | Discharge: 2017-07-19 | Payer: MEDICARE | Attending: Dermatology | Primary: Dermatology

## 2017-07-18 DIAGNOSIS — L409 Psoriasis, unspecified: Principal | ICD-10-CM

## 2017-07-18 MED ORDER — ADALIMUMAB 40 MG/0.8 ML SUBCUTANEOUS PEN KIT: each | 1 refills | 0 days | Status: AC

## 2017-07-18 MED ORDER — ADALIMUMAB 40 MG/0.8 ML SUBCUTANEOUS PEN KIT
SUBCUTANEOUS | 1 refills | 0.00000 days | Status: CP
Start: 2017-07-18 — End: 2017-08-10

## 2017-07-18 NOTE — Unmapped (Addendum)
adalimumab  Pronunciation:  AY da LIM ue mab  BrandRachel Bo, Cyltezo, Humira  What is the most important information I should know about adalimumab?  This medicine affects your immune system. You may get infections more easily, even serious or fatal infections.  Before or during treatment with adalimumab, tell your doctor if you have signs of infection such as fever, chills, aches, tiredness, cough, skin sores, diarrhea, or burning when you urinate.  What is adalimumab?  Adalimumab reduces the effects of a substance in the body that can cause inflammation.  Adalimumab is used to treat many inflammatory conditions in adults, such as ulcerative colitis, rheumatoid arthritis, psoriatic arthritis, ankylosing spondylitis, plaque psoriasis, and a skin condition called hidradenitis suppurativa.  Adalimumab is also used in adults and children to treat Crohn's disease or juvenile idiopathic arthritis.  Adalimumab may also be used for purposes not listed in this medication guide.  What should I discuss with my healthcare provider before using adalimumab?  You should not use this medicine if you are allergic to adalimumab.  Before you start using adalimumab, tell your doctor if you have signs of infection --fever, chills, sweats, muscle aches, tiredness, cough, bloody mucus, skin sores, diarrhea, burning when you urinate, or feeling constantly tired.  Adalimumab should not be given to a child younger than 66 years old (or 58 years old if treating Crohn's disease).  Children using adalimumab should be current on all childhood immunizations before starting treatment.  Tell your doctor if you have ever had:  ?? tuberculosis (or if anyone in your household has tuberculosis);  ?? cancer;  ?? hepatitis B (adalimumab can cause hepatitis B to come back or get worse);  ?? diabetes;  ?? congestive heart failure;  ?? any numbness or tingling, or a nerve-muscle disorder such as multiple sclerosis or Guillain-Barre syndrome;  ?? an allergy to latex rubber;  ?? if you are scheduled to have major surgery; or  ?? if you have recently received or are scheduled to receive any vaccine.  Tell your doctor where you live and if you have recently traveled or plan to travel. You may be exposed to infections that are common to certain areas of the world.  Adalimumab may cause a rare type of lymphoma (cancer) of the liver, spleen, and bone marrow that can be fatal. This has occurred mainly in teenagers and young men with Crohn's disease or ulcerative colitis. However, anyone with an inflammatory autoimmune disorder may have a higher risk of lymphoma. Talk with your doctor about your own risk.  It is not known whether this medicine will harm an unborn baby. Tell your doctor if you are pregnant. Make sure any doctor caring for your newborn baby knows if you used adalimumab while you were pregnant.  It may not be safe to breast-feed a baby while you are using this medicine. Ask your doctor about any risks.  How should I use adalimumab?  Follow all directions on your prescription label and read all medication guides or instruction sheets. Use the medicine exactly as directed.  Adalimumab is injected under the skin. A healthcare provider will teach you how to properly use this medicine by yourself.  Do not start using adalimumab if you have any signs of an infection. Call your doctor for instructions.  Read and carefully follow any instruction sheet provided with your medicine. Do not use adalimumab if you do not understand the instructions for proper use. Ask your doctor or pharmacist if you have any  questions.  The dose schedule for adalimumab is highly variable and depends on the condition you are treating. Follow your doctor's dosing instructions very carefully.  Prepare your injection only when you are ready to give it. Do not use if the medicine looks cloudy or has particles in it. Call your pharmacist for new medicine.  Adalimumab affects your immune system. You may get infections more easily, even serious or fatal infections. Your doctor will need to examine you on a regular basis.  Store this medicine in its original carton in a refrigerator. Do not freeze. If you are traveling, carefully follow all patient instructions for storing your medicine during travel.  Throw away any adalimumab that has become frozen.  Use a needle and syringe only once and then place them in a puncture-proof sharps container. Follow state or local laws about how to dispose of this container. Keep it out of the reach of children and pets.  What happens if I miss a dose?  Use the medicine as soon as you remember, and then go back to your regular injection schedule. Do not use extra medicine to make up the missed dose.  What happens if I overdose?  Seek emergency medical attention or call the Poison Help line at [phone].  What should I avoid while using adalimumab?  Do not inject adalimumab into skin that is bruised, red, tender, or hard.  Avoid being near people who are sick or have infections. Tell your doctor at once if you develop signs of infection.  Do not receive a live vaccine while using adalimumab. The vaccine may not work as well during this time, and may not fully protect you from disease. Live vaccines include measles, mumps, rubella (MMR), polio, rotavirus, typhoid, yellow fever, varicella (chickenpox), or zoster (shingles).  What are the possible side effects of adalimumab?  Get emergency medical help if you have any of these signs of an allergic reaction: hives; difficulty breathing; swelling of your face, lips, tongue, or throat.  Stop using adalimumab and call your doctor right away if you have any symptoms of lymphoma:  ?? fever, swollen glands, night sweats, general feeling of illness;  ?? joint and muscle pain, skin rash, easy bruising or bleeding;  ?? pale skin, feeling light-headed or short of breath, cold hands and feet;  ?? pain in your upper stomach that may spread to your shoulder; or  ?? loss of appetite, feeling full after eating only a small amount, weight loss.  Also call your doctor at once if you have:  ?? new or worsening psoriasis (raised, silvery flaking of the skin);  ?? liver problems --fever, body aches, tiredness, stomach pain, right-sided upper stomach pain, vomiting, loss of appetite, dark urine, clay-colored stools, jaundice (yellowing of the skin or eyes);  ?? lupus-like syndrome --joint pain or swelling, chest pain, shortness of breath, patchy skin color that worsens in sunlight;  ?? nerve problems --numbness, tingling, dizziness, vision problems, weakness in your arms or legs; or  ?? signs of tuberculosis --fever with ongoing cough, weight loss (fat or muscle).  Older adults may be more likely to develop infections or cancer while using adalimumab.  Common side effects may include:  ?? headache;  ?? cold symptoms such as stuffy nose, sinus pain, sneezing, sore throat;  ?? rash; or  ?? redness, bruising, itching, or swelling where the injection was given.  This is not a complete list of side effects and others may occur. Call your doctor for medical advice about  side effects. You may report side effects to FDA at 1-800-FDA-1088.  What other drugs will affect adalimumab?  Some drugs should not be used together with adalimumab. Tell your doctor about all medicines you use, and those you start or stop using during your treatment with adalimumab, especially:  ?? abatacept, etanercept;  ?? anakinra;  ?? azathioprine, mercaptopurine; or  ?? certolizumab, golimumab, infliximab, rituximab.  This list is not complete. Other drugs may interact with adalimumab, including prescription and over-the-counter medicines, vitamins, and herbal products. Not all possible interactions are listed in this medication guide.  Where can I get more information?  Your pharmacist can provide more information about adalimumab.  Remember, keep this and all other medicines out of the reach of children, never share your medicines with others, and use this medication only for the indication prescribed.  Every effort has been made to ensure that the information provided by [employer] ('[employer]') is accurate, up-to-date, and complete, but no guarantee is made to that effect. Drug information contained herein may be time sensitive. [employer] information has been compiled for use by healthcare practitioners and consumers in the [place] and therefore [employer] does not warrant that uses outside of the [place] are appropriate, unless specifically indicated otherwise. Mult[employer]'s drug information does not endorse drugs, diagnose patients or recommend therapy. Mult[employer]'s drug information is an Investment [employer], corporate to assist licensed healthcare practitioners in caring for their patients and/or to serve consumers viewing this service as a supplement to, and not a substitute for, the expertise, skill, knowledge and judgment of healthcare practitioners. The absence of a warning for a given drug or drug combination in no way should be construed to indicate that the drug or drug combination is safe, effective or appropriate for any given patient. Mult[employer] does not assume any responsibility for any aspect of healthcare administered with the aid of information Mult[employer] provides. The information contained herein is not intended to cover all possible uses, directions, precautions, warnings, drug interactions, allergic reactions, or adverse effects. If you have questions about the drugs you are taking, check with your doctor, nurse or pharmacist.  Copyright [phone] [employer] Version: 13.01. Revision date: 06/14/2016.  Care instructions adapted under license by [facility]. If you have questions about a medical condition or this instruction, always ask your healthcare professional. [employer] Inc[employer] disclaims any warranty or liability for your use of this information.

## 2017-07-18 NOTE — Unmapped (Signed)
Per test claim for HUMIRA at the [facility], patient needs Medication Assistance Program for Prior Authorization.

## 2017-07-18 NOTE — Unmapped (Signed)
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 Eyvonne Left favoring nodular vasculitis/erythema induratum  - Prior Angela Burke for TB (a/w nodular vasculitis) 03/2015: negative and repeat quant gold 03/2017: negative  - Continue potassium iodide (SSKI) 1 gram/mL solution 7 drops TID in OJ   - TSH/T4 checked on 04/26/17, no need to recheck today  - Continue pentoxifylline 400mg  TID  - Currently on prednisone 20 mg daily: alternative 20mg /10mg . Keep pred stable until Humira started.   - continue: clobetasol ointment BID. Recommend patient apply this to affected areas of lower legs until next visit.   - Add HUMIRA 40mg  SQ  - discussed required compression daily - she has not yet had the compression devices from the lymphedema clinic approved.     RTC: 4 weeks  ____________________________________________    CC: PAN vs EI/NV follow-up    HPI:  Autumn Mcdonald is a 47 y.o. female who was last seen by Dr. Charlsie Merles in January 2019.   She was discussed at our complex patient conference ([place]) and several items were discussed:  1. Referral to lymphedema clinic for discussion/authorization of compression device.   2. Inflammatory markers when she flares  3. Consideration of oral or injectable agents for psoriasis that might have indirect benefits on the NV/EI of her lower legs    She is not presently using compression and does feel like things are relatively stable to slightly worse.     Interval history:  - Currently on: SSK, pentoxifylline 400mg  TID, prednisone 20mg  daily. Tolerating SSKI without signs of thyroid disturbance or GI issues.  - Course: failed MTX (11/2014), plaquenil (8/16), immuran (3/17), imuran (5/17), dapsone  -Biopsies: small vessel vasculitis suggestive for PAN in 2016; venous stasis disease 08/2016; deep arteritis with some ??    Denies any other skin concerns, including no other areas with bleeding, drainage, pain, pruritus, growth, or change.     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 The pattern of vasculitis has some similarities to nodular vasculitis, although there is very little panniculitis in these two specimens.  Discussed with Dr. Eyvonne Left and he favors NV/EI.  ??  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  Neuro: A, A, Ox 3. Answers questions appropriately.  Skin: Per patient request, examination of the scalp, face, bilateral upper and lower extremities, examined and pertinent for:  - Bilateral pitting edema and erythematous patches (no nodules or plaques today) of the anterior and medial lower legs and dorsal feet, stable compared to last visit

## 2017-07-20 ENCOUNTER — Other Ambulatory Visit: Payer: Self-pay | Admitting: Family Medicine

## 2017-07-20 NOTE — Unmapped (Signed)
[facility] Special[facility] Medication Referral: PA APPROVED    Medication (Brand/Generic): HUMIRA    Initial FSI Test Claim completed with resulted information below:  No PA required  Patient ABLE to fill at [facility]  Insurance Company:  [employer]  Anticipated Copay: $8.50    As Co-pay is under $100 defined limit, per policy there will be no further investigation of need for financial assistance at this time unless patient requests. This referral has been communicated to the provider and handed off to the [facility] team for further processing and filling of prescribed medication.   ______________________________________________________________________  Please utilize this referral for viewing purposes as it will serve as the central location for all relevant documentation and updates.

## 2017-07-20 NOTE — Telephone Encounter (Signed)
Last OV 07/04/17 Flexeril last filled 06/06/17 #270 with 0

## 2017-07-21 MED ORDER — EMPTY CONTAINER
2 refills | 0 days
Start: 2017-07-21 — End: 2018-07-21

## 2017-07-21 NOTE — Unmapped (Signed)
[facility]   Patient Onboarding/Medication Counseling    Ms.Eberle is a 47 y.o. 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    Medication: Humira, sharps container     Verified patient's date of birth / HIPAA.      Education Provided: ??    Dose/Administration discussed: inject 2 pens (80 mg) under the skin on day 1, then 1 pen (40 mg) on day 8, and 1 pen (40 mg) every 14 days thereafter. This medication should be taken  without regard to food.  Stressed the importance of taking medication as prescribed and to contact provider if that changes at any time.  Discussed missed dose instructions.    Storage requirements: this medicine should be stored in the refrigerator.     Side effects / precautions discussed: Discussed common side effects, including injection site reaction, risk of infection. If patient experiences fever/chills, they need to call the doctor.  Patient will receive a drug information handout with shipment.    Handling precautions / disposal reviewed:  Patient will dispose of needles in a sharps container or empty laundry detergent bottle.    Drug Interactions: other medications reviewed and up to date in Epic.  No drug interactions identified.    Comorbidities/Allergies: reviewed and up to date in Epic.    Verified therapy is appropriate and should continue      Delivery Information    Medication Assistance provided: Prior Authorization    Anticipated copay of $8.50 reviewed with patient. Verified delivery address in FSI and reviewed medication storage requirement.    Scheduled delivery date: Tues, March 12    Explained that we ship using UPS or courier and this shipment will not require a signature.      Explained the services we provide at [facility] and that each month we would call to set up refills.  Stressed importance of returning phone calls so that we could ensure they receive their medications in time each month.  Informed patient that we should be setting up refills 7-10 days prior to when they will run out of medication.  Informed patient that welcome packet will be sent.      Patient verbalized understanding of the above information as well as how to contact the pharmacy at [phone] option 4 with any questions/concerns.  The pharmacy is open Monday through Friday 8:30am-4:30pm.  A pharmacist is available 24/7 via pager to answer any clinical questions they may have.        Patient Specific Needs      ? Patient has no physical, cognitive, or cultural barriers.    ? Patient prefers to have medications discussed with  Patient     ? Patient is able to read and understand education materials at a high school level or above.    ? Patient's primary language is  English           Presenter, broadcasting  [facility] Specialty Pharmacist

## 2017-07-24 MED FILL — HUMIRA PF PEN (BOX)/40MG/0.8mL/PEN: HUMIRA PF PEN (BOX)/40MG/0.8mL/PEN | 28 days supply | Qty: 2 | Fill #0

## 2017-07-24 MED FILL — SHARPS KIT/NA/MISC: SHARPS KIT/NA/MISC | 120 days supply | Qty: 1 | Fill #0

## 2017-07-28 ENCOUNTER — Other Ambulatory Visit: Payer: Self-pay | Admitting: Family Medicine

## 2017-07-28 MED ORDER — CLONAZEPAM 1 MG PO TABS: 1.0000 mg | ORAL_TABLET | Freq: Every day | ORAL | 3 refills | Status: DC

## 2017-07-28 MED ORDER — HYDROCODONE-ACETAMINOPHEN 10-325 MG PO TABS: 1.0000 | ORAL_TABLET | Freq: Four times a day (QID) | ORAL | 0 refills | Status: DC | PRN

## 2017-07-28 NOTE — Telephone Encounter (Signed)
Pt got #30 Clonazepam last month, no refills, not 90.  So ok to fill

## 2017-07-28 NOTE — Telephone Encounter (Signed)
Last OV 07/04/17 Clonazepam last filled 07/04/17 #30 with 0

## 2017-07-28 NOTE — Telephone Encounter (Signed)
Last OV 07/04/17 Clonazepam last filled 07/04/17 #90 with 1 Hydrocodone last filled 07/04/17 #90 with 0

## 2017-08-01 MED ORDER — PENTOXIFYLLINE ER 400 MG TABLET,EXTENDED RELEASE
ORAL_TABLET | 2 refills | 0.00000 days | Status: CP
Start: 2017-08-01 — End: 2018-01-17

## 2017-08-10 MED ORDER — ADALIMUMAB 40 MG/0.8 ML SUBCUTANEOUS PEN KIT
SUBCUTANEOUS | 1 refills | 0.00000 days | Status: CP
Start: 2017-08-10 — End: 2017-10-04

## 2017-08-14 ENCOUNTER — Other Ambulatory Visit: Payer: Self-pay | Admitting: Family Medicine

## 2017-08-15 NOTE — Unmapped (Signed)
duplicate

## 2017-08-15 NOTE — Unmapped (Signed)
[facility] Refill and Clinical Coordination Note  Medication(s): Autumn Mcdonald, DOB: 12-09-1970  Phone: [phone] (home) , Alternate phone contact: N/A  Shipping address: [address]  [place] [address]  Phone or address changes today?: No  All above HIPAA information verified.  Insurance changes? No    Completed refill and clinical call assessment today to schedule patient's medication shipment from the [facility] [phone]).      MEDICATION RECONCILIATION    Confirmed the medication and dosage are correct and have not changed: Yes, regimen is correct and unchanged.Finished loading dose, now on maint dose (K note added on this)    Were there any changes to your medication(s) in the past month:  No, there are no changes reported at this time.    ADHERENCE    Is this medicine transplant or covered by Medicare Part B? No.    Did you miss any doses in the past 4 weeks? No missed doses reported.  Adherence counseling provided? Not needed     SIDE EFFECT MANAGEMENT    Are you tolerating your medication?:  Autumn Mcdonald reports tolerating the medication.  Side effect management discussed: None      Therapy is appropriate and should be continued.    Evidence of clinical benefit: See Epic note from 07/18/17      FINANCIAL/SHIPPING    Delivery Scheduled: Yes, Expected medication delivery date: 08/23/17   Additional medications refilled: No additional medications/refills needed at this time.    The patient will receive an FSI print out for each medication shipped and additional FDA Medication Guides as required.  Patient education from [facility] or [name] may also be included in the shipment.    Autumn Mcdonald did not have any additional questions at this time.    Delivery address validated in FSI scheduling system: Yes, address listed above is correct.      We will follow up with patient monthly for standard refill processing and delivery.      Thank you,  Thad Ranger   [facility] Specialty Pharmacist

## 2017-08-21 ENCOUNTER — Encounter: Admit: 2017-08-21 | Discharge: 2017-08-22 | Payer: MEDICARE | Attending: Dermatology | Primary: Dermatology

## 2017-08-21 DIAGNOSIS — Z79899 Other long term (current) drug therapy: Secondary | ICD-10-CM

## 2017-08-21 DIAGNOSIS — I776 Arteritis, unspecified: Principal | ICD-10-CM

## 2017-08-21 DIAGNOSIS — I890 Lymphedema, not elsewhere classified: Secondary | ICD-10-CM

## 2017-08-21 NOTE — Unmapped (Signed)
For next 2 weeks:    20mg  of prednisone alternating with days of 0 prednisone    At 2 week mark (provided you have not had a big flare) : April 22nd -   Taper to:    15mg  of prednisone alternating with days of 0 prednisone

## 2017-08-22 MED FILL — HUMIRA PF PEN (BOX)/40MG/0.8mL/PEN: HUMIRA PF PEN (BOX)/40MG/0.8mL/PEN | 28 days supply | Qty: 1 | Fill #1

## 2017-08-23 ENCOUNTER — Institutional Professional Consult (permissible substitution): Payer: Medicare Other | Admitting: Pulmonary Disease

## 2017-08-23 NOTE — Unmapped (Signed)
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 Eyvonne Left favoring nodular vasculitis/erythema induratum  - Prior Angela Burke for TB (a/w nodular vasculitis) 03/2015: negative and repeat quant gold 03/2017: negative  - Continue on Humira 40mg  SQ once every other week  - Continue potassium iodide (SSKI) 1 gram/mL solution 7 drops TID in OJ   - TSH/T4 checked on 04/26/17, no need to recheck today  - Continue pentoxifylline 400mg  TID  - Currently on prednisone 20 mg daily/ alternating with 0 x 2 weeks, then drop to 15mg /alternating with 0 x 2 weeks,   - continue: clobetasol ointment BID. Recommend patient apply this to affected areas of lower legs until next visit.   - encouraged compression.       RTC: 4 weeks  ____________________________________________    CC: PAN vs EI/NV follow-up    HPI:  [name] is a 47 y.o. Female, here for follow up.   Patient report starting Humira and feeling significantly improved with the loading series. She lost some of those gains in the last week or so. Doing the injection without any issues.   Not using any compression devices     She has decreased her prednisone to 20mg /none in the last few days and feels stable      Interval history:  - Currently on: SSK, pentoxifylline 400mg  TID, prednisone 20mg  daily. Tolerating SSKI without signs of thyroid disturbance or GI issues.  - Course: failed MTX (11/2014), plaquenil (8/16), immuran (3/17), imuran (5/17), dapsone  - add Humira 06/2017  -Biopsies: small vessel vasculitis suggestive for PAN in 2016; venous stasis disease 08/2016; deep arteritis with some ??    Denies any other skin concerns, including no other areas with bleeding, drainage, pain, pruritus, growth, or change.     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 The pattern of vasculitis has some similarities to nodular vasculitis, although there is very little panniculitis in these two specimens.  Discussed with Dr. Eyvonne Left and he favors NV/EI.  ??  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  Neuro: A, A, Ox 3. Answers questions appropriately.  Skin: Per patient request, examination of the scalp, face, bilateral upper and lower extremities, examined and pertinent for:  - Bilateral pitting edema and erythematous patches (no nodules or plaques today) of the anterior and medial lower legs and dorsal feet, stable compared to last visit

## 2017-08-28 ENCOUNTER — Institutional Professional Consult (permissible substitution): Payer: Medicare Other | Admitting: Pulmonary Disease

## 2017-08-29 ENCOUNTER — Other Ambulatory Visit: Payer: Self-pay | Admitting: Family Medicine

## 2017-08-29 NOTE — Telephone Encounter (Signed)
Last OV 07/04/17 Alprazolam last filled 06/06/17 #90 with 1

## 2017-08-30 ENCOUNTER — Encounter: Payer: Self-pay | Admitting: Pulmonary Disease

## 2017-08-30 ENCOUNTER — Other Ambulatory Visit: Payer: Self-pay | Admitting: Family Medicine

## 2017-08-30 ENCOUNTER — Ambulatory Visit (INDEPENDENT_AMBULATORY_CARE_PROVIDER_SITE_OTHER): Payer: Medicare Other | Admitting: Pulmonary Disease

## 2017-08-30 ENCOUNTER — Other Ambulatory Visit: Payer: Self-pay | Admitting: General Practice

## 2017-08-30 VITALS — BP 128/78 | HR 91 | Ht 62.5 in | Wt 380.6 lb

## 2017-08-30 DIAGNOSIS — G4733 Obstructive sleep apnea (adult) (pediatric): Secondary | ICD-10-CM | POA: Diagnosis not present

## 2017-08-30 MED ORDER — FUROSEMIDE 40 MG PO TABS: ORAL_TABLET | ORAL | 0 refills | Status: DC

## 2017-08-30 MED ORDER — CYCLOBENZAPRINE HCL 10 MG PO TABS: 10.0000 mg | ORAL_TABLET | Freq: Three times a day (TID) | ORAL | 0 refills | Status: DC | PRN

## 2017-08-30 MED ORDER — HYDROCODONE-ACETAMINOPHEN 10-325 MG PO TABS: 1.0000 | ORAL_TABLET | Freq: Four times a day (QID) | ORAL | 0 refills | Status: DC | PRN

## 2017-08-30 NOTE — Assessment & Plan Note (Signed)
Hope is that if we get her to sleep well, we may be able to decrease some of her medications such as clonazepam and Nuvigil

## 2017-08-30 NOTE — Patient Instructions (Signed)
Home sleep study will be scheduled   based on this , we will try to get you aCPAP machine

## 2017-08-30 NOTE — Assessment & Plan Note (Signed)
Given excessive daytime somnolence, narrow pharyngeal exam, witnessed apneas & loud snoring, obstructive sleep apnea is very likely & an overnight polysomnogram will be scheduled as a home study. The pathophysiology of obstructive sleep apnea , it's cardiovascular consequences & modes of treatment including CPAP were discused with the patient in detail & they evidenced understanding.  Pretest probability is high and she would likely be a CPAP machine.  She may be self treating herself by sleeping in a recliner.

## 2017-08-30 NOTE — Telephone Encounter (Signed)
Last OV 07/04/17, Next OV 12/27/17  Flexeril last filled 06/06/17, #270 with 0 refills  Hydro w/ace last filled 07/28/17, #90 with 0 refills

## 2017-08-30 NOTE — Progress Notes (Signed)
Subjective:    Patient ID: [name], female    DOB: 01/20/1971, 47 y.o.   MRN: [id number]  HPI  47 year old morbidly obese woman presents for evaluation of excessive daytime somnolence and fatigue. Loud snoring has been noted by family members and she reports non-refreshing sleep. She also has fibromyalgia and is always hurting.  I note multiple medications for depression/anxiety, clonazepam for restless legs and nuvigil for somnolence.  She sees tried psychiatry.  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  This is managed by [facility].  Epworth sleepiness score is 7 she reports sleepiness while sitting and reading, watching TV or lying down to rest in the afternoon. Bedtime is between 10:11 PM, sleep latency is about 30 minutes, she sleeps on her left side on 2 pillows but for the last 2 years and been sleeping in a recliner.  She reports 5-6 nocturnal awakenings including nocturia and is out of bed at 9:30 AM feeling tired with occasional headaches that seem to resolve as the day goes on and dryness of mouth. She has gained about 50 pounds over the last year which she attributes to steroids.  She was diagnosed with [name] many years ago and placed on CPAP with good improvement in her daytime somnolence and fatigue but stopped using this for unclear reasons and she attributes this to her problems with vasculitis   Significant tests/ events reviewed  12/2010 HST >> AHI 37/h  PFT 2012 isolated decreased DLCO   Past Medical History:  Diagnosis Date  . Allergic rhinitis   . Ankle fracture, right   . Anxiety   . Carpal tunnel syndrome   . Cervical cancer ([place]) 1998  . Fibromyalgia   . Hyperlipidemia   . Hypertension   . Migraine headache   . Obesity   . Sleep apnea     Past Surgical History:  Procedure Laterality Date  . ABDOMINAL  HYSTERECTOMY  10/1996  . ABDOMINAL SURGERY  jan/11/2012   gastric bypass surgery  . CARPAL TUNNEL RELEASE     bilateral   . CESAREAN SECTION    . CHOLECYSTECTOMY  07/1992  . ESOPHAGOGASTRODUODENOSCOPY N/A 01/14/2016   Procedure: ESOPHAGOGASTRODUODENOSCOPY (EGD);  Surgeon: Mauri Pole, MD;  Location: [facility] ENDOSCOPY;  Service: Endoscopy;  Laterality: N/A;    Allergies  Allergen Reactions  . Niacin Other (See Comments)    REACTION: Swelling, problems breathing  . Sulfamethoxazole-Trimethoprim Other (See Comments)    REACTION: Rash, throat closed, eyes swelled.  . Aspirin Hives  . Bactrim Hives  . Benzoin Compound Rash  . Cephalexin Hives  . Clindamycin/Lincomycin Hives  . Doxycycline Hives  . Iohexol Hives    Pt treated with PO benedryl  . Lisinopril Hives  . Naproxen Hives  . Pnu-Imune [Pneumococcal Polysaccharide Vaccine] Rash  . Sulfonamide Derivatives Rash    Social History   Socioeconomic History  . Marital status: Single    Spouse name: Not on file  . Number of children: Not on file  . Years of education: Not on file  . Highest education level: Not on file  Occupational History  . 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 Financial resource strain: Not on file  . Food insecurity:    Worry: Not on file    Inability: Not on file  .  Transportation needs:    Medical: Not on file    Non-medical: Not on file  Tobacco Use  . Smoking status: Never Smoker  . Smokeless tobacco: Never Used  Substance and Sexual Activity  . Alcohol use: No  . Drug use: No  . Sexual activity: Yes    Comment: TAH  Lifestyle  . Physical activity:    Days per week: Not on file    Minutes per session: Not on file  . Stress: Not on file  Relationships  . Social connections:    Talks on phone: Not on file    Gets together: Not on file    Attends religious service: Not on file    Active member of club or organization: Not on file    Attends meetings of  clubs or organizations: Not on file    Relationship status: Not on file  . Intimate partner violence:    Fear of current or ex partner: Not on file    Emotionally abused: Not on file    Physically abused: Not on file    Forced sexual activity: Not on file  Other Topics Concern  . Not on file  Social History Narrative  . Not on file     Family History  Problem Relation Age of Onset  . Diabetes Mother   . Emphysema Mother   . Allergies Mother   . Hypertension Mother   . Heart disease Father   . Allergies Father   . Prostate cancer Father   . Breast cancer Maternal Grandmother     Review of Systems  Constitutional: Negative for fever and unexpected weight change.  HENT: Negative for congestion, dental problem, ear pain, nosebleeds, postnasal drip, rhinorrhea, sinus pressure, sneezing, sore throat and trouble swallowing.   Eyes: Negative for redness and itching.  Respiratory: Negative for cough, chest tightness, shortness of breath and wheezing.   Cardiovascular: Positive for leg swelling. Negative for palpitations.  Gastrointestinal: Negative for nausea and vomiting.  Genitourinary: Negative for dysuria.  Musculoskeletal: Positive for joint swelling.  Skin: Negative for rash.  Allergic/Immunologic: Negative.  Negative for environmental allergies, food allergies and immunocompromised state.  Neurological: Positive for headaches.  Hematological: Bruises/bleeds easily.  Psychiatric/Behavioral: Positive for dysphoric mood. The patient is nervous/anxious.        Objective:   Physical Exam  Gen. Pleasant, obese, in no distress, normal affect ENT - no lesions, no post nasal drip, class 2-3 airway Neck: No JVD, no thyromegaly, no carotid bruits Lungs: no use of accessory muscles, no dullness to percussion, decreased without rales or rhonchi  Cardiovascular: Rhythm regular, heart sounds  normal, no murmurs or gallops, chronic 2+ peripheral edema with redness Abdomen: soft and  non-tender, no hepatosplenomegaly, BS normal. Musculoskeletal: No deformities, no cyanosis or clubbing Neuro:  alert, non focal, no tremors       Assessment & Plan:

## 2017-08-31 DIAGNOSIS — F3341 Major depressive disorder, recurrent, in partial remission: Secondary | ICD-10-CM | POA: Diagnosis not present

## 2017-08-31 DIAGNOSIS — F518 Other sleep disorders not due to a substance or known physiological condition: Secondary | ICD-10-CM | POA: Diagnosis not present

## 2017-09-01 ENCOUNTER — Other Ambulatory Visit: Payer: Self-pay | Admitting: Family Medicine

## 2017-09-12 NOTE — Unmapped (Signed)
[facility] Refill Coordination Note    Specialty Medication(s) to be Shipped:   Inflammatory Disorders: Humira    Other medication(s) to be shipped: N/A     Autumn Mcdonald, DOB: 02-Sep-1970  Phone: [phone] (home)   Shipping Address: [address]  [place] [address]    All above HIPAA information was verified with patient.     Completed refill call assessment today to schedule patient's medication shipment from the [facility] [phone]).       Specialty medication(s) and dose(s) confirmed: Regimen is correct and unchanged.   Changes to medications: Autumn Mcdonald reports no changes reported at this time.  Changes to insurance: No  Questions for the pharmacist: No    The patient will receive an FSI print out for each medication shipped and additional FDA Medication Guides as required.  Patient education from [place] or [name] may also be included in the shipment.    DISEASE-SPECIFIC INFORMATION        For Inflammatory disorders patients on injectable medications: Patient currently has 0 doses left.  Next injection is scheduled for 09/26/17.    ADHERENCE     Medication Adherence    Patient reported X missed doses in the last month:  0  Specialty Medication:  HUMIRA  Patient is on additional specialty medications:  No  Patient is on more than two specialty medications:  No  Any gaps in refill history greater than 2 weeks in the last 3 months:  no  Demonstrates understanding of importance of adherence:  yes  Informant:  patient  Confirmed plan for next specialty medication refill:  delivery by pharmacy  Refills needed for supportive medications:  not needed          Refill Coordination    Has the Patients' Contact Information Changed:  No  Is the Shipping Address Different:  No         SHIPPING     Shipping address confirmed in FSI.     Delivery Scheduled: Yes, Expected medication delivery date: 09/20/17 via UPS or courier.     Renette Butters   [facility] Specialty Technician

## 2017-09-13 MED ORDER — PREDNISONE 5 MG TABLET
ORAL_TABLET | ORAL | 0 refills | 0.00000 days | Status: CP
Start: 2017-09-13 — End: 2017-10-04

## 2017-09-15 MED ORDER — HALOBETASOL PROPIONATE 0.05 % TOPICAL OINTMENT
OPHTHALMIC | 1 refills | 0.00000 days | Status: CP
Start: 2017-09-15 — End: 2017-10-07

## 2017-09-19 MED FILL — HUMIRA PF PEN (BOX)/40MG/0.8mL/PEN: HUMIRA PF PEN (BOX)/40MG/0.8mL/PEN | 28 days supply | Qty: 1 | Fill #2

## 2017-09-21 ENCOUNTER — Encounter: Payer: Self-pay | Admitting: Family Medicine

## 2017-09-26 ENCOUNTER — Other Ambulatory Visit: Payer: Self-pay | Admitting: Family Medicine

## 2017-09-26 NOTE — Telephone Encounter (Signed)
Last OV 07/04/17, Next OV 12/27/17  Last filled 08/30/17, # 90 with 0 refills

## 2017-09-27 ENCOUNTER — Other Ambulatory Visit: Payer: Self-pay | Admitting: Family Medicine

## 2017-09-27 NOTE — Telephone Encounter (Signed)
Last OV 07/04/17 Alprazolam last filled 08/29/17 #90 with 1

## 2017-09-29 NOTE — Unmapped (Signed)
Left msg for patient to call back regarding appt on 12/28/17 with Dr. Scarlette Calico (she has a meeting and needs to r/s)

## 2017-10-01 ENCOUNTER — Other Ambulatory Visit: Payer: Self-pay | Admitting: Family Medicine

## 2017-10-02 MED ORDER — HYDROCODONE-ACETAMINOPHEN 10-325 MG PO TABS: 1.0000 | ORAL_TABLET | Freq: Four times a day (QID) | ORAL | 0 refills | Status: DC | PRN

## 2017-10-02 NOTE — Telephone Encounter (Signed)
Last OV 07/04/17 Hydrocodone last filled 08/30/17 #90 with 0

## 2017-10-02 NOTE — Telephone Encounter (Signed)
Database reviewed.  No red flags.

## 2017-10-04 ENCOUNTER — Ambulatory Visit: Admit: 2017-10-04 | Discharge: 2017-10-05 | Payer: MEDICARE

## 2017-10-04 DIAGNOSIS — R7309 Other abnormal glucose: Secondary | ICD-10-CM

## 2017-10-04 DIAGNOSIS — L409 Psoriasis, unspecified: Principal | ICD-10-CM

## 2017-10-04 DIAGNOSIS — M300 Polyarteritis nodosa: Secondary | ICD-10-CM

## 2017-10-04 DIAGNOSIS — Z9049 Acquired absence of other specified parts of digestive tract: Secondary | ICD-10-CM | POA: Diagnosis not present

## 2017-10-04 DIAGNOSIS — I10 Essential (primary) hypertension: Secondary | ICD-10-CM | POA: Diagnosis not present

## 2017-10-04 DIAGNOSIS — I776 Arteritis, unspecified: Secondary | ICD-10-CM | POA: Diagnosis not present

## 2017-10-04 DIAGNOSIS — I890 Lymphedema, not elsewhere classified: Secondary | ICD-10-CM | POA: Diagnosis not present

## 2017-10-04 DIAGNOSIS — Z8541 Personal history of malignant neoplasm of cervix uteri: Secondary | ICD-10-CM | POA: Diagnosis not present

## 2017-10-04 DIAGNOSIS — Z9884 Bariatric surgery status: Secondary | ICD-10-CM | POA: Diagnosis not present

## 2017-10-04 DIAGNOSIS — Z881 Allergy status to other antibiotic agents status: Secondary | ICD-10-CM | POA: Diagnosis not present

## 2017-10-04 DIAGNOSIS — F329 Major depressive disorder, single episode, unspecified: Secondary | ICD-10-CM | POA: Diagnosis not present

## 2017-10-04 DIAGNOSIS — Z888 Allergy status to other drugs, medicaments and biological substances status: Secondary | ICD-10-CM | POA: Diagnosis not present

## 2017-10-04 DIAGNOSIS — Z79899 Other long term (current) drug therapy: Secondary | ICD-10-CM | POA: Diagnosis not present

## 2017-10-04 DIAGNOSIS — Z7952 Long term (current) use of systemic steroids: Secondary | ICD-10-CM | POA: Diagnosis not present

## 2017-10-04 DIAGNOSIS — Z886 Allergy status to analgesic agent status: Secondary | ICD-10-CM | POA: Diagnosis not present

## 2017-10-04 DIAGNOSIS — Z23 Encounter for immunization: Secondary | ICD-10-CM | POA: Diagnosis not present

## 2017-10-04 DIAGNOSIS — Z90710 Acquired absence of both cervix and uterus: Secondary | ICD-10-CM | POA: Diagnosis not present

## 2017-10-04 DIAGNOSIS — M797 Fibromyalgia: Secondary | ICD-10-CM | POA: Diagnosis not present

## 2017-10-04 DIAGNOSIS — Z882 Allergy status to sulfonamides status: Secondary | ICD-10-CM | POA: Diagnosis not present

## 2017-10-04 DIAGNOSIS — L309 Dermatitis, unspecified: Secondary | ICD-10-CM | POA: Diagnosis not present

## 2017-10-04 LAB — MEAN CORPUSCULAR HEMOGLOBIN CONC: Lab: 31.9

## 2017-10-04 LAB — COMPREHENSIVE METABOLIC PANEL
ALBUMIN: 3.7 g/dL (ref 3.5–5.0)
ALKALINE PHOSPHATASE: 108 U/L (ref 38–126)
ANION GAP: 11 mmol/L (ref 9–15)
AST (SGOT): 19 U/L (ref 14–38)
BILIRUBIN TOTAL: 0.5 mg/dL (ref 0.0–1.2)
BLOOD UREA NITROGEN: 7 mg/dL (ref 7–21)
BUN / CREAT RATIO: 12
CALCIUM: 8.9 mg/dL (ref 8.5–10.2)
CHLORIDE: 91 mmol/L — ABNORMAL LOW (ref 98–107)
CO2: 28 mmol/L (ref 22.0–30.0)
EGFR MDRD AF AMER: 130 mL/min/{1.73_m2} (ref >=60)
EGFR MDRD NON AF AMER: 107 mL/min/{1.73_m2} (ref >=60)
GLUCOSE RANDOM: 110 mg/dL (ref 65–179)
POTASSIUM: 3.6 mmol/L (ref 3.5–5.0)
SODIUM: 130 mmol/L — ABNORMAL LOW (ref 135–145)

## 2017-10-04 LAB — CBC W/ AUTO DIFF
BASOPHILS ABSOLUTE COUNT: 0.1 10*9/L (ref 0.0–0.1)
BASOPHILS RELATIVE PERCENT: 0.7 %
EOSINOPHILS ABSOLUTE COUNT: 0.4 10*9/L (ref 0.0–0.4)
EOSINOPHILS RELATIVE PERCENT: 4.3 %
HEMATOCRIT: 40.3 % (ref 36.0–46.0)
HEMOGLOBIN: 12.8 g/dL (ref 12.0–16.0)
LARGE UNSTAINED CELLS: 1 % (ref 0–4)
LYMPHOCYTES ABSOLUTE COUNT: 2 10*9/L (ref 1.5–5.0)
LYMPHOCYTES RELATIVE PERCENT: 20.8 %
MEAN CORPUSCULAR HEMOGLOBIN CONC: 31.9 g/dL (ref 31.0–37.0)
MEAN PLATELET VOLUME: 7.8 fL (ref 7.0–10.0)
MONOCYTES ABSOLUTE COUNT: 0.6 10*9/L (ref 0.2–0.8)
MONOCYTES RELATIVE PERCENT: 6.3 %
NEUTROPHILS ABSOLUTE COUNT: 6.3 10*9/L (ref 2.0–7.5)
NEUTROPHILS RELATIVE PERCENT: 66.5 %
PLATELET COUNT: 264 10*9/L (ref 150–440)
RED BLOOD CELL COUNT: 4.75 10*12/L (ref 4.00–5.20)
RED CELL DISTRIBUTION WIDTH: 14.9 % (ref 12.0–15.0)
WBC ADJUSTED: 9.4 10*9/L (ref 4.5–11.0)

## 2017-10-04 LAB — BILIRUBIN TOTAL: Bilirubin:MCnc:Pt:Ser/Plas:Qn:: 0.5

## 2017-10-04 LAB — ERYTHROCYTE SEDIMENTATION RATE: Lab: 18

## 2017-10-04 LAB — HEMOGLOBIN A1C
ESTIMATED AVERAGE GLUCOSE: 114 mg/dL
HEMOGLOBIN A1C: 5.6

## 2017-10-04 LAB — C-REACTIVE PROTEIN: C reactive protein:MCnc:Pt:Ser/Plas:Qn:: 10.1 — ABNORMAL HIGH

## 2017-10-04 LAB — ESTIMATED AVERAGE GLUCOSE: Estimated average glucose:MCnc:Pt:Bld:Qn:Estimated from glycated hemoglobin: 114

## 2017-10-04 LAB — BASIC METABOLIC PANEL
BUN: 7 (ref 4–21)
CREATININE: 0.6 (ref 0.5–1.1)
Glucose: 110
Potassium: 3.6 (ref 3.4–5.3)
Sodium: 130 — AB (ref 137–147)

## 2017-10-04 LAB — HEPATIC FUNCTION PANEL
ALK PHOS: 108 (ref 25–125)
ALT: 9 (ref 7–35)
AST: 19 (ref 13–35)
BILIRUBIN, TOTAL: 0.5

## 2017-10-04 LAB — CBC AND DIFFERENTIAL
HEMATOCRIT: 40 (ref 36–46)
Hemoglobin: 12.8 (ref 12.0–16.0)
NEUTROS ABS: 6
WBC: 9.4

## 2017-10-04 MED ORDER — PREDNISONE 5 MG TABLET
ORAL_TABLET | 0 refills | 0 days | Status: CP
Start: 2017-10-04 — End: 2018-01-10

## 2017-10-04 MED ORDER — ADALIMUMAB 40 MG/0.8 ML SUBCUTANEOUS PEN KIT
SUBCUTANEOUS | 11 refills | 0.00000 days | Status: CP
Start: 2017-10-04 — End: 2017-12-25

## 2017-10-04 MED ORDER — ADALIMUMAB 40 MG/0.8 ML SUBCUTANEOUS PEN KIT: 40 mg | each | 11 refills | 0 days | Status: AC

## 2017-10-04 MED FILL — HUMIRA PF PEN (BOX)/40MG/0.8mL/PEN: HUMIRA PF PEN (BOX)/40MG/0.8mL/PEN | 28 days supply | Qty: 2 | Fill #0

## 2017-10-04 NOTE — Unmapped (Signed)
Humira dose increase to once weekly - $0 / [facility]

## 2017-10-04 NOTE — Unmapped (Signed)
Patient Name: Autumn Mcdonald  PCP: ??Frederico Hamman, MD  History: Patient and records  Date of visit: 10/04/17    Chief Compliant: Cutaneous PAN vs. erythema induratum/nodular vasculitis    HPI: Autumn Mcdonald is a 47 y.o. 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 Biopsies in 2016 showed small vessel vasculitis suggestive for PAN in 2016. Started prednisone taper and then mtx in 11/2014. Plaquenil started in 12/2014. Started imuran in 07/2015, and titrated up to 150 mg qd w/o sufficient control of her PAN despite being on methotrexate and hydroxychloroquine concurrently. Thus, in 09/2015 she was started on cellcept, and titrated up to 1500 mg BID. She continued to have persistent leg swelling and pain despite maximized cellcept. There was concern high doses of prednisone was contributing to leg swelling at this point so prednisone tapered off but leg pain worsened off. Given this, she was re-biopsied. Biopsy from 08/2016 more consistent with venous stasis disease, so it was unclear if her symptoms were due to PAN vs venous stasis. As a trial for treatment of PAN, we discontinued cellcept and she was started on dapsone by dermatology. Patient developed LFT abnormalities on dapsone and had worsening of leg symptoms so dapsone was discontinued. Repeat biopsy again completed with findings suggestive for erythema induratum/nodular vasculitis so patient was started on oral potassium iodide and receiving intralesional steroid injections starting in November/December 2018. Was evaluated by vascular surgery and no vascular insufficiency was noted, thus her leg swelling has been attributed to lymphedema. Evaluated at lymphedema clinic at [facility] this past summer with recommendation for compression stocking. Patient reports difficulty with compliance for compression stocking. Patient last seen by me in January 2019 on hydroxychloroquine 200 mg po bid, prednisone 20 mg po qd, and potassium iodide qid.  She was noted to have a new skin lesion with question of psoriasis. Evaluation by Dermatology confirmed plaque psoriasis. Humira was added in March 2019 by Dermatology.  She was last evaluated by Dermatology in April 2019 and advised to taper prednisone.  She returns today for follow-up.    Patient was scheduled to be seen sooner several weeks ago but had a flat tire on the way to appointment so was rescheduled. Patient reports that she is currently on Humira every other week, hydroxychloroquine 200 mg po bid, potassium iodide qid, and prednisone 15 mg qod. She reports tolerating the prednisone due to some pain when without. She feels worse on the day off compared to days when she has the prednisone. She has not noticed any major difference since being on the Humira. She reports that two days later after the loading dose of Humira she felt good with no pain and was hopeful. Since then, she has not noticed much difference with taking Humira every two weeks in regards to pain. She does feel like Humira has helped her skin but still has some lesions.   The skin lesions in her leg has improved with less swelling. She continues to gain weight. She cannot afford compression stocking for lymphedema and insurance won't help her pay. However, she was able to find pants with compression capabilities that help.     ROS??: Attests to the above, otherwise, review of all other systems is negative.    ????Record Review: Available records were reviewed, including pertinent office visits, labs, and imaging.      Past Medical and Surgical History:  ??  Patient Active Problem List    Diagnosis Date Noted   ???  Melena 02/16/2017   ??? Chronic narcotic use 09/07/2016   ??? Bleeding stomach ulcer 01/20/2016   ??? Acute blood loss anemia 01/14/2016   ??? BRBPR (bright red blood per rectum) 01/14/2016   ??? Neuropathy 12/09/2015   ??? Migraine 11/28/2014   ??? Cutaneous polyarteritis nodosa (CMS-HCC) 11/28/2014   ??? Chronic gout of multiple sites 02/25/2014   ??? Gastroesophageal reflux disease 01/08/2014   ??? Leg pain, lateral 12/11/2012   ??? History of surgical procedure 10/31/2012   ??? Psoriasis 01/09/2012   ??? Degeneration of intervertebral disc of lumbar region 11/30/2011   ??? Cutaneous fungal infection 11/15/2011   ??? DJD (degenerative joint disease) 10/14/2011   ??? Fibromyalgia 10/14/2011   ??? Frequent urination 04/13/2011   ??? Obstructive sleep apnea syndrome 11/16/2010   ??? Elevated blood sugar 08/23/2010   ??? Other abnormal glucose 10/08/2009   ??? Contracture of tendon 08/17/2009   ??? Pain in limb 11/05/2008   ??? Other malaise and fatigue 09/19/2008   ??? Ingrowing nail 06/17/2008   ??? Adverse drug reaction 04/03/2008   ??? Adiposity 01/03/2007   ??? Hyperlipidemia 09/22/2006   ??? Pain in joint, lower leg 09/22/2006   ??? Muscle pain 09/20/2006   ??? Anxiety state 06/28/2006   ??? Essential hypertension 06/28/2006     Past Medical History:   Diagnosis Date   ??? Allergic     See other notes   ??? Arthritis 05/16/2001   ??? Bleeding gastric ulcer    ??? Cervical cancer (CMS-HCC)    ??? Depression    ??? Disorder of skin or subcutaneous tissue 2008    Diagnosised in   ??? Eczema 05/16/2014   ??? Fibromyalgia    ??? Hypertension    ??? Morbid obesity (CMS-HCC)    ??? Psoriasis Last 2 years     Past Surgical History:   Procedure Laterality Date   ??? CESAREAN SECTION  1994   ??? CHOLECYSTECTOMY  March 1994   ??? GASTRIC BYPASS  January 2014   ??? HYSTERECTOMY  June 1998   ??? SKIN BIOPSY  2017     Allergies:   ??  Allergies   Allergen Reactions   ??? Iohexol Hives     Pt treated with PO benedryl   ??? Keflex [Cephalexin] Shortness Of Breath and Hives   ??? Lincomycin Hcl Hives   ??? Lisinopril Hives   ??? Sulfa (Sulfonamide Antibiotics) Swelling and Rash   ??? Sulfamethoxazole-Trimethoprim Hives     REACTION: Rash, throat closed, eyes swelled.   ??? Aspirin      REACTION: Hives   ??? Doxycycline      REACTION: Hives   ??? Naproxen      REACTION: Hives   ??? Niacin      REACTION: Swelling, problems breathing   ??? Benzoin Compound Rash   ??? Pneumococcal 23-Val Ps Vaccine Rash     cellulitis     Current Outpatient Medications:  ??  Current Outpatient Medications   Medication Sig Dispense Refill   ??? adalimumab (HUMIRA) 40 mg/0.8 mL subcutaneous pen kit Take 1 pen SQ every 14 days 4 each 1   ??? ALPRAZolam (XANAX) 0.5 MG tablet      ??? ARIPiprazole (ABILIFY) 10 MG tablet Take 10 mg by mouth daily.     ??? armodafinil (NUVIGIL) 250 mg tablet Take 250 mg by mouth Two (2) times a day.     ??? BYSTOLIC 10 mg tablet Take 10 mg by mouth daily.  0   ???  clonazePAM (KLONOPIN) 1 MG tablet As needed  3   ??? cyclobenzaprine (FLEXERIL) 10 MG tablet TAKE 1 TABLET BY MOUTH 3 TIMES DAILY AS NEEDED  0   ??? DULoxetine (CYMBALTA) 60 MG capsule Take 60 mg by mouth daily. 120 mg daily     ??? ferrous sulfate 325 (65 FE) MG tablet Take 1 tablet (325 mg total) by mouth daily with breakfast.  6   ??? fluconazole (DIFLUCAN) 150 MG tablet Take 1 tablet (150 mg total) by mouth once.  0   ??? furosemide (LASIX) 40 MG tablet Take 40 mg by mouth Two (2) times a day.     ??? gabapentin (NEURONTIN) 600 MG tablet TAKE 1 TABLET BY MOUTH 3 TIMES DAILY 270 tablet 3   ??? halobetasol (ULTRAVATE) 0.05 % ointment APPLY TO AFFECTED AREA 2 TIMES DAILY AS NEEDED. AVOID FACE AND folds. 50 g 1   ??? HYDROcodone-acetaminophen (NORCO 10-325) 10-325 mg per tablet TK 1 T PO Q 6 H PRN  0   ??? hydroxychloroquine (PLAQUENIL) 200 mg tablet TAKE 1 TABLET BY MOUTH 2 TIMES DAILY 180 tablet 3   ??? lamoTRIgine (LAMICTAL) 200 MG tablet TAKE 1 TABLET BY MOUTH EVERY MORNING AND EVERY EVENING  1   ??? nystatin (MYCOSTATIN) cream APPLY TOPICALLY 2 TIMES DAILY  1   ??? pantoprazole (PROTONIX) 40 MG tablet Take 40 mg by mouth Two (2) times a day. bid     ??? pentoxifylline (TRENTAL) 400 mg CR tablet TAKE 1 TABLET BY MOUTH 3 TIMES DAILY WITH A MEAL 90 tablet 2   ??? potassium iodide (SSKI) 1 gram/mL solution 5 drops (0.62ml) three times daily in OJ. 237 mL 1   ??? predniSONE (DELTASONE) 20 MG tablet Take 1 tablet (20 mg total) by mouth daily. 30 tablet 0   ??? predniSONE (DELTASONE) 5 MG tablet Alternate 15mg  of prednisone (3 tabs) with 0 medication on alternating days until follow up 120 tablet 0   ??? promethazine (PHENERGAN) 25 MG tablet      ??? rosuvastatin (CRESTOR) 20 MG tablet Take 20 mg by mouth daily.  6   ??? valACYclovir (VALTREX) 1000 MG tablet TAKE 1 TABLET BY MOUTH EVERY DAY (Patient not taking: Reported on 08/21/2017) 90 tablet 3     No current facility-administered medications for this visit.         Immunization History   Administered Date(s) Administered   ??? INFLUENZA TIV (TRI) 97MO+ W/ PRESERV (IM) 03/17/2011   ??? Influenza Vaccine Quad (IIV4 PF) 52mo+ injectable 02/17/2015, 05/17/2015, 02/23/2016, 02/13/2017   ??? Influenza Virus Vaccine, unspecified formulation 02/27/2013, 06/17/2015, 03/14/2016   ??? Influenza Whole 02/15/2010   ??? PNEUMOCOCCAL POLYSACCHARIDE 23 03/17/2011   ??? TdaP 01/05/2016   ??? Tetanus and diptheria,(adult), adsorbed, 2Lf tetanus toxoid, PF 05/16/2002     ????  PHYSICAL EXAM??  Vitals:    10/04/17 0843   BP: 120/64   BP Site: L Arm   BP Position: Sitting   BP Cuff Size: Large   Pulse: 88   Temp: 35.7 ??C (96.2 ??F)   TempSrc: Oral   Weight: (!) 180.4 kg (397 lb 11.2 oz)   Height: 158.8 cm (5' 2.52)     General:   Pleasant 47 y.o.female in no acute distress, obese    Eyes:   PERRL, conjunctiva and sclera not inflamed. Tears appear adequate.    ENT:   No oropharyngeal lesions. Mucous membranes moist.    Lymph:   No masses or cervical lymphadenopathy. No  significant edema   Cardiovascular:  Regular rate and rhythm. No murmur, rub, or gallop.   Lungs:  Clear to auscultation.Normal respiratory effort.    Musculoskeletal:   Ambulates w/o assistance. Hands w/o swelling or stiffness but reports some discomfort with palpation. FROM elbows and shoulders. Knees with some restricted flexion due to body habitus. Hips with full ROM. No pain in feet/ankles. She does have myofascial trigger points at occiput, trapezius, chest wall, trochanteric bursa. Less tender at bicipital tendons, lateral epicondylitis and pes anserine bursitis.   Neurological:  CN 2-12 grossly intact. 5/5 strength on extremities.   Psych:  Appropriate affect and mood   Skin:  B/l shins with erythema and skin thickening. Scattered small plaques on left elbow         Recent Results (from the past 1008 hour(s))   CBC w/ Differential    Collection Time: 10/04/17  9:35 AM   Result Value Ref Range    WBC 9.4 4.5 - 11.0 10*9/L    RBC 4.75 4.00 - 5.20 10*12/L    HGB 12.8 12.0 - 16.0 g/dL    HCT 04.5 40.9 - 81.1 %    MCV 85.0 80.0 - 100.0 fL    MCH 27.1 26.0 - 34.0 pg    MCHC 31.9 31.0 - 37.0 g/dL    RDW 91.4 78.2 - 95.6 %    MPV 7.8 7.0 - 10.0 fL    Platelet 264 150 - 440 10*9/L    Neutrophils % 66.5 %    Lymphocytes % 20.8 %    Monocytes % 6.3 %    Eosinophils % 4.3 %    Basophils % 0.7 %    Absolute Neutrophils 6.3 2.0 - 7.5 10*9/L    Absolute Lymphocytes 2.0 1.5 - 5.0 10*9/L    Absolute Monocytes 0.6 0.2 - 0.8 10*9/L    Absolute Eosinophils 0.4 0.0 - 0.4 10*9/L    Absolute Basophils 0.1 0.0 - 0.1 10*9/L    Large Unstained Cells 1 0 - 4 %         ??GENERAL SUMMARY/IMPRESSION ??AND RECOMMENDATIONS: ??  Overall, this is a 47 y.o. 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  On exam today, lower leg nodular lesions are red but less swollen in appearance compared to past. She does continue to have active psoriasis on her left elbow and she reports on her right thigh. Not seen by clothing and body habitus. She reports initial symptomatic relief with loading dose of Humira but not with maintenance every other week. Given her morbid obesity, she likely needs higher dosing and will increase Humira to weekly. In addition, hydroxychloroquine can aggravate psoriasis and unclear if providing benefit for the lower leg lesions suspected to be related to erythema induratum/nodular vasculitis, therefore, suggest discontinuation. Agree with dermatology recommendation to taper prednisone but patient may be having symptoms of adrenal insufficiency on the off days. Therefore recommend avoiding alternating off days until prednisone tapered to lower doses.  Advised her to start prednisone 10 mg daily and taper from here. Labs today for medication monitoring and assessing disease activity. Follow-up in 3-4 months with myself.       Diagnosis ICD-10-CM Associated Orders   1. Cutaneous polyarteritis nodosa (CMS-HCC) M30.0 CBC w/ Differential     Comprehensive Metabolic Panel     CRP  C-Reactive Protein  ESR Sed rate     CBC w/ Differential     Comprehensive Metabolic Panel     CRP  C-Reactive Protein     ESR Sed rate     CBC w/ Differential   2. Psoriasis L40.9 CBC w/ Differential     Comprehensive Metabolic Panel     CRP  C-Reactive Protein     ESR Sed rate     adalimumab (HUMIRA) 40 mg/0.8 mL subcutaneous pen kit     CBC w/ Differential     Comprehensive Metabolic Panel     CRP  C-Reactive Protein     ESR Sed rate     CBC w/ Differential   3. Other abnormal glucose R73.09 Hemoglobin A1c     Hemoglobin A1c   4. PAN (polyarteritis nodosa) (CMS-HCC) M30.0 predniSONE (DELTASONE) 5 MG tablet     Patient Instructions   Discontinue the hydroxychloroquine (Plaquenil).    Increase the Humira to weekly injections.    Prednisone taper:  Take prednisone 10 mg (Two 5 mg tablets) daily for two weeks, then  Take prednisone 7.5 mg (One and a half 5 mg tablets) daily for two weeks, then  Take prednisone 5 mg (One 5 mg tablets) daily for two weeks, then  Take prednisone 2.5 mg (Half of a 5 mg tablet) daily for two weeks, then  Take prednisone 2.5 mg (Half of a 5 mg tablet) every other day for two weeks, then OFF.     Pryor Guettler C. Scarlette Calico, MD, PhD  Assistant Professor of Medicine  Department of Medicine/Division of Rheumatology  [facility]          ?

## 2017-10-04 NOTE — Unmapped (Signed)
Discontinue the hydroxychloroquine (Plaquenil).    Increase the Humira to weekly injections.    Prednisone taper:  Take prednisone 10 mg (Two 5 mg tablets) daily for two weeks, then  Take prednisone 7.5 mg (One and a half 5 mg tablets) daily for two weeks, then  Take prednisone 5 mg (One 5 mg tablets) daily for two weeks, then  Take prednisone 2.5 mg (Half of a 5 mg tablet) daily for two weeks, then  Take prednisone 2.5 mg (Half of a 5 mg tablet) every other day for two weeks, then OFF.

## 2017-10-04 NOTE — Unmapped (Addendum)
[facility] Refill & Clinical Coordination Note  Specialty Medication(s): Humira - dose increase  Additional Medications shipped: na    Autumn Mcdonald, DOB: Oct 11, 1970  Phone: [phone] (home) , Alternate phone contact: N/A  Phone or address changes today?: No  All above HIPAA information was verified with patient.  Shipping Address: [address]  [place] [address]   Insurance changes? No    Completed refill call assessment today to schedule patient's medication shipment from the [facility] [phone]).      Confirmed the medication and dosage are correct and have not changed: No, patient reports changes to the regimen as follows: dose increased to once weekly dosing    Confirmed patient started or stopped the following medications in the past month:  No, there are no changes reported at this time.    Are you tolerating your medication?:  Autumn Mcdonald reports tolerating the medication.    ADHERENCE    Did you miss any doses in the past 4 weeks? No missed doses reported.     Medication is clinically appropriate. See note from 08/21/17.    FINANCIAL/SHIPPING    Delivery Scheduled: Yes, Expected medication delivery date: Thurs, May 23     The patient will receive an FSI print out for each medication shipped and additional FDA Medication Guides as required.  Patient education from [place] or [name] may also be included in the shipment    Autumn Mcdonald did not have any additional questions at this time.    Delivery address validated in FSI scheduling system: Yes, address listed in FSI is correct.    We will follow up with patient monthly for standard refill processing and delivery.      Thank you,  Tawanna Solo [facility] Specialty Pharmacist

## 2017-10-06 ENCOUNTER — Telehealth: Payer: Self-pay | Admitting: Pulmonary Disease

## 2017-10-06 ENCOUNTER — Encounter: Payer: Self-pay | Admitting: General Practice

## 2017-10-06 NOTE — Unmapped (Signed)
Labs faxed to Dr.Hughes per request.    Thanks    Dorathy Daft

## 2017-10-06 NOTE — Telephone Encounter (Signed)
Spoke to pt she is aware Rodena Piety is not here today and we are closed on Monday and that Rodena Piety will call her on Tuesday [name]

## 2017-10-06 NOTE — Telephone Encounter (Signed)
Forwarding to [facility] basket per protocol

## 2017-10-10 MED ORDER — HALOBETASOL PROPIONATE 0.05 % TOPICAL OINTMENT
OPHTHALMIC | 1 refills | 0.00000 days | Status: CP
Start: 2017-10-10 — End: 2017-11-20

## 2017-10-10 NOTE — Telephone Encounter (Signed)
Ms. Lauman has been rescheduled to pick up the HST machine on 10/25/2017 @ 3:00pm. 3rd time rescheduling her

## 2017-10-10 NOTE — Telephone Encounter (Signed)
The patient is suppose to pick up the HST machine today at 3:00pm. I just tried to called the patient and I had to LVM about her appt today at 3:00pm

## 2017-10-13 NOTE — Unmapped (Signed)
Please let Ms. Sparacino know that I would keep prednisone the same and come back into clinic with Dr. Charlsie Merles on 10/19/17 that is already scheduled to discuss taper.     Thanks -   AWF

## 2017-10-19 ENCOUNTER — Ambulatory Visit: Admit: 2017-10-19 | Discharge: 2017-10-20 | Payer: MEDICARE

## 2017-10-19 DIAGNOSIS — R239 Unspecified skin changes: Secondary | ICD-10-CM

## 2017-10-19 DIAGNOSIS — L52 Erythema nodosum: Secondary | ICD-10-CM

## 2017-10-19 DIAGNOSIS — R52 Pain, unspecified: Secondary | ICD-10-CM

## 2017-10-19 DIAGNOSIS — Z5181 Encounter for therapeutic drug level monitoring: Secondary | ICD-10-CM

## 2017-10-19 DIAGNOSIS — M300 Polyarteritis nodosa: Secondary | ICD-10-CM

## 2017-10-19 DIAGNOSIS — R238 Other skin changes: Secondary | ICD-10-CM

## 2017-10-19 DIAGNOSIS — B999 Unspecified infectious disease: Principal | ICD-10-CM

## 2017-10-19 LAB — T4 TOTAL: Thyroxine:MCnc:Pt:Ser/Plas:Qn:: 6.55

## 2017-10-19 LAB — THYROID STIMULATING HORMONE: Thyrotropin:ACnc:Pt:Ser/Plas:Qn:: 1.971

## 2017-10-19 LAB — FREE T4: Thyroxine.free:MCnc:Pt:Ser/Plas:Qn:: 1.35

## 2017-10-19 LAB — T3 TOTAL: Triiodothyronine:MCnc:Pt:Ser/Plas:Qn:: 1.2

## 2017-10-19 MED ORDER — AZITHROMYCIN 500 MG TABLET
PACK | Freq: Every day | ORAL | 0 refills | 0.00000 days | Status: CP
Start: 2017-10-19 — End: 2017-11-02

## 2017-10-19 MED ORDER — MUPIROCIN 2 % TOPICAL OINTMENT
Freq: Three times a day (TID) | TOPICAL | 1 refills | 0.00000 days | Status: CP
Start: 2017-10-19 — End: 2017-10-30

## 2017-10-19 NOTE — Unmapped (Addendum)
We will call you back with the swab result  Take the antibiotic (azithromycin) and use the mupirocin to cover up the sores         azithromycin  Pronunciation:  a ZITH roe MYE sin  Brand:  Azithromycin 3 Day Dose Pack, Azithromycin 5 Day Dose Pack, Zithromax, Zithromax TRI-PAK, Zithromax Z-Pak, Zmax  What is the most important information I should know about azithromycin?  You should not use this medication if you have ever had jaundice or liver problems caused by taking azithromycin.  What is azithromycin?  Azithromycin is an antibiotic that fights bacteria.  Azithromycin is used to treat many different types of infections caused by bacteria, such as respiratory infections, skin infections, ear infections, and sexually transmitted diseases.  Azithromycin may also be used for purposes not listed in this medication guide.  What should I discuss with my healthcare provider before taking azithromycin?  You should not use azithromycin if you are allergic to it, or if:  ?? you have ever had jaundice or liver problems caused by taking azithromycin; or  ?? you are allergic to similar drugs such as clarithromycin, erythromycin, or telithromycin.  To make sure azithromycin is safe for you, tell your doctor if you have ever had:  ?? liver disease;  ?? kidney disease;  ?? myasthenia gravis;  ?? a heart rhythm disorder;  ?? low levels of potassium in your blood; or  ?? long QT syndrome (in you or a family member).  This medicine is not expected to harm an unborn baby. Tell your doctor if you are pregnant or plan to become pregnant.  It is not known whether azithromycin passes into breast milk or if it could harm a nursing baby. Tell your doctor if you are breast-feeding a baby.  Do not give this medicine to a child younger than 25 months old.  How should I take azithromycin?  Follow all directions on your prescription label. Do not take this medicine in larger or smaller amounts or for longer than recommended. The dose and length of treatment with azithromycin may not be the same for every type of infection.  You may take most forms of azithromycin with or without food.  Take Zmax extended release liquid (oral suspension) on an empty stomach, at least 1 hour before or 2 hours after a meal.  To use the oral suspension single dose packet: Open the packet and pour the medicine into 2 ounces of water. Stir this mixture and drink all of it right away. Do not save for later use. To make sure you get the entire dose, add a little more water to the same glass, swirl gently and drink right away.  Throw away any mixed Zmax oral suspension that has not been used within 12 hours.  Shake the oral suspension (liquid) well just before you measure a dose. Measure liquid medicine with the dosing syringe provided, or with a special dose-measuring spoon or medicine cup. If you do not have a dose-measuring device, ask your pharmacist for one.  Use this medicine for the full prescribed length of time. Your symptoms may improve before the infection is completely cleared. Skipping doses may also increase your risk of further infection that is resistant to antibiotics. Azithromycin will not treat a viral infection such as the flu or a common cold.  Store at room temperature away from moisture and heat. Throw away any unused liquid medicine after 10 days.  What happens if I miss a dose?  Take  the missed dose as soon as you remember. Skip the missed dose if it is almost time for your next scheduled dose. Do not  take extra medicine to make up the missed dose.  What happens if I overdose?  Seek emergency medical attention or call the Poison Help line at (856) [phone].  What should I avoid while taking azithromycin?  Do not take antacids that contain aluminum or magnesium within 2 hours before or after you take azithromycin. This includes Acid Gone, Aldroxicon, Alternagel, Di-Gel, Gaviscon, Gelusil, Genaton, Maalox, Maldroxal, Milk of Magnesia, Mintox, Mylagen, Mylanta, Pepcid Complete, Rolaids, Rulox, and others. These antacids can make azithromycin less effective when taken at the same time.  Antibiotic medicines can cause diarrhea, which may be a sign of a new infection. If you have diarrhea that is watery or bloody, call your doctor. Do not use anti-diarrhea medicine unless your doctor tells you to.  Avoid exposure to sunlight or tanning beds. Azithromycin can make you sunburn more easily. Wear protective clothing and use sunscreen (SPF 30 or higher) when you are outdoors.  What are the possible side effects of azithromycin?  Get emergency medical help if you have signs of an allergic reaction (hives, difficult breathing, swelling in your face or throat) or a severe skin reaction (fever, sore throat, burning in your eyes, skin pain, red or purple skin rash that spreads and causes blistering and peeling).  Seek medical treatment if you have a serious drug reaction that can affect many parts of your body. Symptoms may include: skin rash, fever, swollen glands, flu-like symptoms, muscle aches, severe weakness, unusual bruising, or yellowing of your skin or eyes. This reaction may occur several weeks after you began using azithromycin.  Call your doctor at once if you have:  ?? severe stomach pain, diarrhea that is watery or bloody;  ?? fast or pounding heartbeats, fluttering in your chest, shortness of breath, and sudden dizziness (like you might pass out); or  ?? liver problems --nausea, upper stomach pain, itching, tired feeling, loss of appetite, dark urine, clay-colored stools, jaundice (yellowing of the skin or eyes).  Call your doctor right away if a baby taking azithromycin becomes irritable or vomits while eating or nursing.  Older adults may be more likely to have side effects on heart rhythm, including a life-threatening fast heart rate.  Common side effects may include:  ?? diarrhea;  ?? nausea, vomiting, stomach pain; or  ?? headache.  This is not a complete list of side effects and others may occur. Call your doctor for medical advice about side effects. You may report side effects to FDA at 1-800-FDA-1088.  What other drugs will affect azithromycin?  Tell your doctor about all your current medicines and any you start or stop using, especially:  ?? nelfinavir; or  ?? a blood thinner --warfarin, Coumadin, Jantoven.  This list is not complete. Other drugs may interact with azithromycin, including prescription and over-the-counter medicines, vitamins, and herbal products. Not all possible interactions are listed in this medication guide.  Where can I get more information?  Your pharmacist can provide more information about azithromycin.  Remember, keep this and all other medicines out of the reach of children, never share your medicines with others, and use this medication only for the indication prescribed.  Every effort has been made to ensure that the information provided by [employer] ('[employer]') is accurate, up-to-date, and complete, but no guarantee is made to that effect. Drug information contained herein may be time  sensitive. [employer] information has been compiled for use by healthcare practitioners and consumers in the [place] and therefore [employer] does not warrant that uses outside of the [place] are appropriate, unless specifically indicated otherwise. [employer]'s drug information does not endorse drugs, diagnose patients or recommend therapy. Mult[employer]'s drug information is an [employer], corporate to assist licensed healthcare practitioners in caring for their patients and/or to serve consumers viewing this service as a supplement to, and not a substitute for, the expertise, skill, knowledge and judgment of healthcare practitioners. The absence of a warning for a given drug or drug combination in no way should be construed to indicate that the drug or drug combination is safe, effective or appropriate for any given patient. Mult[employer] does not assume any responsibility for any aspect of healthcare administered with the aid of information Mult[employer] provides. The information contained herein is not intended to cover all possible uses, directions, precautions, warnings, drug interactions, allergic reactions, or adverse effects. If you have questions about the drugs you are taking, check with your doctor, nurse or pharmacist.  Copyright [phone] [employer] Version: 17.07. Revision date: 02/12/2016.  Care instructions adapted under license by [facility]. If you have questions about a medical condition or this instruction, always ask your healthcare professional. [employer] disclaims any warranty or liability for your use of this information.

## 2017-10-19 NOTE — Unmapped (Signed)
DERMATOLOGY NOTE: RETURN PATIENT     Assessment and Plan:    Acute onset of bright red erythema of the RLE with 3 new ulcers in setting of underlying stasis dermatitis and PAN vs EI: Ddx includes severe flaring of underlying stasis dermatitis vs cellulitis (especially given draining ulcers today that could act as a nidus of infection after a recent fall) vs erysipelas   - Performed aerobic swab today.   - Given extensive allergy list to a variety of antibiotics (including Keflex, lincomycin, sulfa, doxycycline), joint decision was made to start with azithromycin at 500mg  daily x3 days. 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 Eyvonne Left favoring nodular vasculitis/erythema induratum  - Prior Angela Burke for TB (a/w nodular vasculitis) 03/2015: negative and repeat quant gold 03/2017: negative  - Continue on Humira 40mg  SQ weekly   - Continue potassium iodide (SSKI) 1 gram/mL solution 7 drops TID in OJ   - TSH/T4 checked on 04/26/17, rechecking TSH/T4/T3 today  - Continue pentoxifylline 400mg  TID  - Continue prednisone taper per rheumatology  - Continue: clobetasol ointment BID. Recommend patient apply this to affected areas of lower legs until next visit.   - d/c HCQ per rheum as ineffective and contributing to her psoriasis  - Encouraged compression.     RTC: Return in about 2 months (around 12/19/2017).  ____________________________________________    CC:   Chief Complaint   Patient presents with   ??? Follow-up     right leg area of concern       HPI:  Autumn Mcdonald is a 47 y.o. female last seen by Dr. 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 At this time, she was continued on Humira 40mg  once every other week, SSKI and pentoxifylline 400mg  TID. Prednisone was tapered from 20mg  daily alternating with 0 x2 weeks then this was decreased to 15mg  alternating with 0 x 2 weeks.    Today, she reports acute flare over the past 3-4 days. Noticed marked erythema, swelling and pain of the RLE that happened all of a sudden 3 days ago. Reports a recent fall ~6 days ago landing on the RLE- has a bruise on the RLE today from the fall. Has not tried anything differently for this acute flare apart from her baseline PAN vs EIN medications however she believes that the redness and pain have already been resolving some over the past day or so. Denies recent episode of immobility or hospitalization. Currently taking Humira 40mg  weekly and prednisone 10mg  daily per rheumatology.    Interval history:  - Currently on: SSK, pentoxifylline 400mg  TID, prednisone 20mg  daily. Tolerating SSKI without signs of thyroid disturbance or GI issues.  - Course: failed MTX (11/2014), plaquenil (8/16), immuran (3/17), imuran (5/17), dapsone  - add Humira 06/2017  -Biopsies: small vessel vasculitis suggestive for PAN in 2016; venous stasis disease 08/2016; deep arteritis with some ??    Denies any other skin concerns, including no other areas with bleeding, drainage, pain, pruritus, growth, or change.     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 The pattern of vasculitis has some similarities to nodular vasculitis, although there is very little panniculitis in these two specimens.  Discussed with Dr. Eyvonne Left and he favors NV/EI.  ??  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  Neuro: A, A, Ox 3. Answers questions appropriately.  Skin: Per patient request, examination of the scalp, face, bilateral upper and lower extremities, examined and pertinent for:  - Bright red ill-defined edematous plaque in the RLE with 3 round ulcers with a fibrinous exudative base draining serous fluid. There are several tight bullae and verrucous changes in distal RLE in a background of pitting edema.  - Pitting edema and erythematous patches of the anterior and medial left lower leg  - All other areas examined were normal or had no significant findings  - All other areas examined were normal or had no significant findings    The patient was seen and examined by Lennon Alstrom, MD who agrees with the assessment and plan as above.

## 2017-10-23 NOTE — Unmapped (Signed)
Reviewed thyroid labs for SSKI use as well as aerobic culture to r/o cellulitis. Thyroid labs wnl and aerobic culture notable for 3+ MSSA that is pan-susceptible (including susceptibility of erythromycin).     Notified pt of result through MyChart. Discussed that azithromycin as prescribed at last visit appears to be an appropriate abx choice at this time. Asked patient to notify us if no improvement.

## 2017-10-24 NOTE — Unmapped (Signed)
[facility] Refill Coordination Note  Specialty Medication(s): Humira  Additional Medications shipped: N/A    Autumn Mcdonald, DOB: 04/30/1971  Phone: [phone] (home) , Alternate phone contact: N/A  Phone or address changes today?: No  All above HIPAA information was verified with patient.  Shipping Address: [address]  [place] [address]   Insurance changes? No    Completed refill call assessment today to schedule patient's medication shipment from the [facility] [phone]).      Confirmed the medication and dosage are correct and have not changed: Yes, regimen is correct and unchanged.    Confirmed patient started or stopped the following medications in the past month:  Yes. Autumn Mcdonald reports starting the following medications: Zithromax for 3 days due to infection    Are you tolerating your medication?:  Autumn Mcdonald reports tolerating the medication.    ADHERENCE    Is this medicine transplant or covered by Medicare Part B? Yes.    1 pen left for dose on 6/13    Did you miss any doses in the past 4 weeks? Yes.  Autumn Mcdonald reports missing 1 dose days of medication therapy in the last 4 weeks.  Autumn Mcdonald reports having infection and starting antibiotic as the cause of their non-adherance.    FINANCIAL/SHIPPING    Delivery Scheduled: Yes, Expected medication delivery date: 10/31/17     The patient will receive an FSI print out for each medication shipped and additional FDA Medication Guides as required.  Patient education from [place] or [name] may also be included in the shipment.    Autumn Mcdonald did not have any additional questions at this time.    Delivery address validated in FSI scheduling system: Yes, address listed in FSI is correct.    We will follow up with patient monthly for standard refill processing and delivery.      Thank you,  Autumn Mcdonald Vangie Bicker   [facility] Specialty Pharmacist

## 2017-10-24 NOTE — Unmapped (Signed)
Received message from pt asking for clarification about instructions from last visit. Called pt back and spoke to her via phone. Discussed our plan in detail and clarified aerobic culture result c/w cellulitis given growth of 3+MSSA.Marland Kitchen Pt reports significant improvement s/p azithromycin. She asks whether or not to proceed with her other medications. Clarified that we did not change any of her other medications and that she should proceed with her Humira, SSKI, prednisone taper, pentoxyfylline, clobetasol ointment, and leg elevation/compression stockings. She is in agreement with this plan.

## 2017-10-25 DIAGNOSIS — G4733 Obstructive sleep apnea (adult) (pediatric): Secondary | ICD-10-CM | POA: Diagnosis not present

## 2017-10-25 MED ORDER — MOXIFLOXACIN 400 MG TABLET
ORAL_TABLET | Freq: Every day | ORAL | 0 refills | 0.00000 days | Status: CP
Start: 2017-10-25 — End: 2017-11-01

## 2017-10-25 NOTE — Unmapped (Signed)
Reviewed updated aerobic culture result now growing 3+ pasturella as well as 3+ MSSA (previously discussed with pt). Discussed this update with pt via phone. Patient denies dog bites but does have a dog at home; she recalls her dog licking her wound sites.   - Recommended moxifloxacin 400mg  daily for 7 day course. Discussed side effects including headache, dizziness, nausea, diarrhea, as well as rare but serious side effects including tendinitis, tendon rupture, peripheral neuropathy and CNS effects.   - It appears that her pharmacy needs a PA for this. Requested that it be faxed over to our [place] office today. If this does not work out in the next 1-2 days, discussed alt of goodrx where it appears that she can obtain this medication for $20-30 at a [employer].  - Placed referral to Allergy for further delineation of multiple abx allergies.  - Patient to follow-up with Dr. Charlsie Merles in 1 week.

## 2017-10-25 NOTE — Unmapped (Signed)
Reviewed updated aerobic culture notable for Pasturella species 3+. Attempted to call pt x1 without response. Will try again.

## 2017-10-26 ENCOUNTER — Other Ambulatory Visit: Payer: Self-pay | Admitting: Family Medicine

## 2017-10-26 NOTE — Telephone Encounter (Signed)
Last OV 07/04/17 Alprazolam last filled 08/29/17 #90 with 1 Clonazepam last filled 07/28/17 #30 with 3

## 2017-10-26 NOTE — Telephone Encounter (Signed)
Medications are chronic medications from Dr. Birdie Riddle.

## 2017-10-26 NOTE — Telephone Encounter (Signed)
I don't see mention of follow up or management in office notes from last year regarding these medications.  I typically don't use both in same patient.   Do you know if these are chronic medications managed by Dr. Birdie Riddle? Is she out? Can she get refilled next week? Please let me know. Thanks.

## 2017-10-30 ENCOUNTER — Telehealth: Payer: Self-pay | Admitting: Pulmonary Disease

## 2017-10-30 DIAGNOSIS — G4733 Obstructive sleep apnea (adult) (pediatric): Secondary | ICD-10-CM

## 2017-10-30 MED FILL — HUMIRA PF PEN (BOX)/40MG/0.8mL/PEN: HUMIRA PF PEN (BOX)/40MG/0.8mL/PEN | 28 days supply | Qty: 2 | Fill #1

## 2017-10-30 NOTE — Telephone Encounter (Signed)
Per RA, HST showed mild [name] with 10 events per hour. Recommends RX for auto cpap 5-15cm. OV in 6 weeks.

## 2017-10-31 ENCOUNTER — Other Ambulatory Visit: Payer: Self-pay | Admitting: *Deleted

## 2017-10-31 ENCOUNTER — Other Ambulatory Visit: Payer: Self-pay | Admitting: Family Medicine

## 2017-10-31 DIAGNOSIS — G4733 Obstructive sleep apnea (adult) (pediatric): Secondary | ICD-10-CM

## 2017-10-31 MED ORDER — MUPIROCIN 2 % TOPICAL OINTMENT
TOPICAL | 1 refills | 0.00000 days | Status: CP
Start: 2017-10-31 — End: 2018-09-12

## 2017-10-31 NOTE — Unmapped (Signed)
Approval for Moxifloxin 400mg  tablets received,call placed to [facility] and to patient

## 2017-10-31 NOTE — Unmapped (Signed)
Refilled rx for mupirocin.

## 2017-11-01 MED ORDER — NEBIVOLOL HCL 10 MG PO TABS: 10.0000 mg | ORAL_TABLET | Freq: Every day | ORAL | 0 refills | Status: DC

## 2017-11-01 MED ORDER — HYDROCODONE-ACETAMINOPHEN 10-325 MG PO TABS: 1.0000 | ORAL_TABLET | Freq: Four times a day (QID) | ORAL | 0 refills | Status: DC | PRN

## 2017-11-01 MED ORDER — CYCLOBENZAPRINE HCL 10 MG PO TABS: 10.0000 mg | ORAL_TABLET | Freq: Three times a day (TID) | ORAL | 0 refills | Status: DC | PRN

## 2017-11-01 NOTE — Telephone Encounter (Signed)
Last OV 07/04/17, Next OV 12/27/17  Last filled 10/26/17, # 90 with 0 refills

## 2017-11-01 NOTE — Telephone Encounter (Signed)
Last OV 07/04/17 Hydrocodone last filled 10/02/17 #90 with 0 Flexeril last filled 08/30/17 #270 with 0

## 2017-11-02 ENCOUNTER — Encounter: Admit: 2017-11-02 | Discharge: 2017-11-03 | Payer: MEDICARE

## 2017-11-02 DIAGNOSIS — L0390 Cellulitis, unspecified: Principal | ICD-10-CM

## 2017-11-02 NOTE — Unmapped (Signed)
DERMATOLOGY NOTE: RETURN PATIENT     Assessment and Plan:    Cellulitis of the R lower extremities in setting of chronic venous stasis dermatitis and chronic leg edema  - Cultures with 3+ pasturella and  3+ MSSA. Improved s/p azithromycin x 3 days followed moxifloxacin 400mg  daily x 7 days, however with persistent erythema, few ulceration and pain. No systemic symptoms.   - Will repeat aerobic culture today  - Will decide on need for additional ora abx based on culture results   - Continue mupirocin ointment to open sores TID  - Previously placed referral to Allergy for further delineation of multiple abx allergies.    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 Eyvonne Left favoring nodular vasculitis/erythema induratum  - Prior Angela Burke for TB (a/w nodular vasculitis) 03/2015: negative and repeat quant gold 03/2017: negative  - Continue on Humira 40mg  SQ weekly   - Continue potassium iodide (SSKI) 1 gram/mL solution 7 drops TID in OJ   - TSH/T4 checked on 04/26/17, rechecking TSH/T4/T3 today  - Continue pentoxifylline 400mg  TID  - Continue prednisone taper per rheumatology  - Continue: clobetasol ointment BID. Recommend patient apply this to affected areas of lower legs until next visit.   - Encouraged compression.     RTC: Return in about 1 month (around 11/30/2017) for Recheck.  ____________________________________________    CC:   Chief Complaint   Patient presents with   ??? Infection     doing better than it was she states about 75% better; she is in a lot of pain       HPI:  Autumn Mcdonald is a 47 y.o. female last seen by Dr. Charlsie Merles in 10/19/2017 for cellulitis of the R Lower leg who presents today for follow up of the same. 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   At her LV, aerobic culture was obtained which grew 3+ pasturella as well as 3+ MSSA. She was treated with 3 days of azithromycin, which was then switched to moxifloxacin 400mg  daily for 7 days based on culture results. Since her LV, patient reports significant improvement of her symptoms (75% improvement). She notes that she has had some throbbing pain in her leg today, but attributes this to standing on her feet for a long time. She still has a few open areas.     Currently taking Humira 40mg  weekly and prednisone per rheumatology for PAN.    Interval history:  - Currently on: SSK, pentoxifylline 400mg  TID, prednisone. Tolerating SSKI without signs of thyroid disturbance or GI issues.  - Course: failed MTX (11/2014), plaquenil (8/16), immuran (3/17), imuran (5/17), dapsone  - add Humira 06/2017  -Biopsies: small vessel vasculitis suggestive for PAN in 2016; venous stasis disease 08/2016; deep arteritis with some ??    Denies any other skin concerns, including no other areas with bleeding, drainage, pain, pruritus, growth, or change.     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 The pattern of vasculitis has some similarities to nodular vasculitis, although there is very little panniculitis in these two specimens.  Discussed with Dr. Eyvonne Left and he favors NV/EI.  ??  Biopsy x 2 on BLE 2016--medium vessel vasculitis c/w PAN    PMH:  Morbid obesity  HTN  Depression  Fibromyalgia  Psoriasis: plaque type (BSA around 5%)    FH/SH: reviewed in Epic    ROS:  A balance of 10 systems was reviewed and negative.     Physical Exam:  General: Well-developed, well-nourished female in no acute distress, resting comfortably.  Neuro: A, A, Ox 3. Answers questions appropriately.  Skin: Per patient request, examination of the scalp, face, bilateral upper and lower extremities, examined and pertinent for:  - Erythematous ill-defined edematous plaque on anterior shin with few healing round ulcers with a fibrinous exudative base draining serous fluid  - Pitting and non pitting edema of bl extremities   - Red brown patches on bl legs c/w chronic stasis dermatitis   - All other areas examined were normal or had no significant findings.     The patient was seen and examined by Lennon Alstrom, MD who agrees with the assessment and plan as above.

## 2017-11-02 NOTE — Unmapped (Addendum)
Continue mupirocin ointment to the open areas on the legs.

## 2017-11-06 NOTE — Telephone Encounter (Signed)
Called and spoke with patient, advised her of RA results. Patient verbalized understanding and was ok with order. Order placed. Nothing further needed.

## 2017-11-06 NOTE — Telephone Encounter (Signed)
Patient calling for sleep study results.  CB is [phone]

## 2017-11-08 NOTE — Unmapped (Signed)
Sent MyChart message with culture results.  Culture with only skin flora isolated.  Advised patient to continue topical mupirocin ointment to open areas until healed.

## 2017-11-14 ENCOUNTER — Other Ambulatory Visit: Payer: Self-pay | Admitting: Family Medicine

## 2017-11-17 NOTE — Unmapped (Signed)
Patient missed one dose this past month and has 3 shots on hand at this time. (doses weekly)    [facility] Refill Coordination Note    Specialty Medication(s) to be Shipped:   Inflammatory Disorders: Humira    Other medication(s) to be shipped: n/a     Autumn Mcdonald, DOB: 03-Jul-1970  Phone: [phone] (home)   Shipping Address: [address]  [place] [address]    All above HIPAA information was verified with patient.     Completed refill call assessment today to schedule patient's medication shipment from the [facility] [phone]).       Specialty medication(s) and dose(s) confirmed: Regimen is correct and unchanged.   Changes to medications: [name] reports no changes reported at this time.  Changes to insurance: No  Questions for the pharmacist: No    The patient will receive an FSI print out for each medication shipped and additional FDA Medication Guides as required.  Patient education from [place] or [name] may also be included in the shipment.    DISEASE-SPECIFIC INFORMATION        For Inflammatory disorders patients on injectable medications: Patient currently has 3 doses left.  Next injection is scheduled for tuesday.    ADHERENCE     Medication Adherence    Patient reported X missed doses in the last month:  0  Specialty Medication:  HUMIRA  Patient is on additional specialty medications:  No  Patient is on more than two specialty medications:  No  Any gaps in refill history greater than 2 weeks in the last 3 months:  no  Informant:  patient  Reliability of informant:  reliable  Confirmed plan for next specialty medication refill:  delivery by pharmacy  Refills needed for supportive medications:  not needed          Refill Coordination    Has the Patients' Contact Information Changed:  No  Is the Shipping Address Different:  No         SHIPPING     Shipping address confirmed in FSI.     Delivery Scheduled: Yes, Expected medication delivery date: 11/24/17 via UPS or courier.     Renette Butters   [name] Specialty Technician

## 2017-11-20 MED ORDER — HALOBETASOL PROPIONATE 0.05 % TOPICAL OINTMENT
OPHTHALMIC | 1 refills | 0.00000 days | Status: CP
Start: 2017-11-20 — End: 2018-07-19

## 2017-11-21 NOTE — Unmapped (Signed)
Sent refill for halobetasol.

## 2017-11-23 MED FILL — HUMIRA PF PEN (BOX)/40MG/0.8mL/PEN: HUMIRA PF PEN (BOX)/40MG/0.8mL/PEN | 28 days supply | Qty: 2 | Fill #2

## 2017-11-28 ENCOUNTER — Other Ambulatory Visit: Payer: Self-pay | Admitting: Family Medicine

## 2017-11-28 NOTE — Telephone Encounter (Signed)
Last OV 07/04/17, Next OV 12/27/17  Last filled 11/01/17, # 90 with 0 refills

## 2017-11-28 NOTE — Telephone Encounter (Signed)
Last OV 07/04/17, Next OV 12/27/17  Last filled 10/26/17, # 90 with 0 refills

## 2017-12-14 ENCOUNTER — Encounter: Admit: 2017-12-14 | Discharge: 2017-12-15 | Payer: MEDICARE

## 2017-12-14 DIAGNOSIS — Z5181 Encounter for therapeutic drug level monitoring: Principal | ICD-10-CM

## 2017-12-14 DIAGNOSIS — R238 Other skin changes: Secondary | ICD-10-CM

## 2017-12-14 DIAGNOSIS — L299 Pruritus, unspecified: Secondary | ICD-10-CM

## 2017-12-14 DIAGNOSIS — Z79899 Other long term (current) drug therapy: Secondary | ICD-10-CM | POA: Diagnosis not present

## 2017-12-14 LAB — FREE T4: Thyroxine.free:MCnc:Pt:Ser/Plas:Qn:: 1.01

## 2017-12-14 LAB — THYROID STIMULATING HORMONE: Thyrotropin:ACnc:Pt:Ser/Plas:Qn:: 3.147

## 2017-12-14 LAB — T3 TOTAL: Triiodothyronine:MCnc:Pt:Ser/Plas:Qn:: 1.6

## 2017-12-14 NOTE — Unmapped (Signed)
DERMATOLOGY NOTE: RETURN PATIENT     Assessment and Plan:    Cellulitis of the R lower extremities in setting of chronic venous stasis dermatitis and chronic leg edema- CELLULITIS RESOLVED  - Cultures with 3+ pasturella and  3+ MSSA. s/p azithromycin, moxifloxacin 400mg   - Previously placed referral to Allergy for further delineation of multiple abx allergies.    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 Eyvonne Left favoring nodular vasculitis/erythema induratum  - While there are not venous changes on duplex, suspect based on my review of this article, Does severe venous insufficiency have a different etiology in the morbidly obese?  Is it venous? by Telford Nab et al, that her morbid obesity contributes to impedence of both venous return and lymph return resulting in lymphovenous hypertension and thus changes of venous stasis and lymphedema seen on clinically on her exam.  - Prior Angela Burke for TB (a/w nodular vasculitis) 03/2015: negative and repeat quant gold 03/2017: negative  - Continue on Humira 40mg  SQ weekly   - Continue potassium iodide (SSKI) 1 gram/mL solution 7 drops TID in OJ   - TSH/T4 checked on 10/2017, rechecking TSH/T4/T3 today given new symptoms of temperature intolerance  - Continue pentoxifylline 400mg  TID  - Continue horse chestnut extract 250mg  BID  - Continue prednisone taper per rheumatology  - Continue: clobetasol ointment BID. Recommend patient apply this to affected areas of lower legs until next visit.   - Encouraged compression.  She is going to get custom compression hose.  Insurance will not cover lymphedema pump, lymphedema massage, or visits to our lymphedema clinic.  She will also try lymphedema massage out of pocket near her home, but due to the costs will not be able to keep up the frequency.    RTC: 3 mos ____________________________________________    CC:   Chief Complaint   Patient presents with   ??? Follow-up     on legs, pt states she is slightly better        HPI:  Autumn Mcdonald is a 47 y.o. female last seen by me for cellulitis of the R Lower leg who presents today for follow up.  This has resolved s/p azithro and then moxi.  It has not returned.  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 SHe is on prednisone per rheumatology.  Denies any new nodules or plaques.  Continues to have tenderness and redness of the LE, but feels this has improved.    Prior history:  - Course: failed MTX (11/2014), plaquenil (8/16), immuran (3/17), imuran (5/17), dapsone  - add Humira 06/2017  -Biopsies: small vessel vasculitis suggestive for PAN in 2016; venous stasis disease 08/2016; deep arteritis with some ??    Denies any other skin concerns, including no other areas with bleeding, drainage, pain, pruritus, growth, or change.     Prior biopsies:  Final Diagnosis   Date Value Ref Range Status   01/23/2017   Final    (Outside case, [phone], 9 slides, 11/14/2014)  Skin, left medial ankle, punch  - Arteritis, see comment    Skin, right posterior lower leg, punch  - Dermal edema, hemorrhage and dermal perivascular lymphocytic infiltrate     The changes in the biopsy A, left medial ankle are evidence of a necrotizing  vasculitis involving a small muscular artery in the deep dermis of the skin. The pattern of vasculitis has some similarities to nodular vasculitis, although there is very little panniculitis in these two specimens.  Discussed with Dr. Eyvonne Left and he favors NV/EI.  ??  Biopsy x 2 on BLE 2016--medium vessel vasculitis c/w PAN    PMH:  Morbid obesity  HTN  Depression  Fibromyalgia  Psoriasis: plaque type (BSA around 5%)    FH/SH: reviewed in Epic    ROS:  A balance of 10 systems was reviewed and negative.     Physical Exam:  General: Well-developed, well-nourished female in no acute distress, resting comfortably.  Neuro: A, A, Ox 3. Answers questions appropriately.  Skin: Per patient request, examination of the  bilateral lower extremities, examined and pertinent for:  - Pitting and non pitting edema of bl extremities   - diffuse erythema of the b/l lower legs with induration of the skin; no nodules or plaques seen clinically or palpated.  - All other areas examined were normal or had no significant findings.

## 2017-12-14 NOTE — Unmapped (Addendum)
Patient Education        Cellulitis: Care Instructions  Your Care Instructions    Cellulitis is a skin infection caused by bacteria, most often strep or staph. It often occurs after a break in the skin from a scrape, cut, bite, or puncture, or after a rash.  Cellulitis may be treated without doing tests to find out what caused it. But your doctor may do tests, if needed, to look for a specific bacteria, like methicillin-resistant Staphylococcus aureus (MRSA).  The doctor has checked you carefully, but problems can develop later. If you notice any problems or new symptoms, get medical treatment right away.  Follow-up care is a key part of your treatment and safety. Be sure to make and go to all appointments, and call your doctor if you are having problems. It's also a good idea to know your test results and keep a list of the medicines you take.  How can you care for yourself at home?  ?? Take your antibiotics as directed. Do not stop taking them just because you feel better. You need to take the full course of antibiotics.  ?? Prop up the infected area on pillows to reduce pain and swelling. Try to keep the area above the level of your heart as often as you can.  ?? If your doctor told you how to care for your wound, follow your doctor's instructions. If you did not get instructions, follow this general advice:  ? Wash the wound with clean water 2 times a day. Don't use hydrogen peroxide or alcohol, which can slow healing.  ? You may cover the wound with a thin layer of petroleum jelly, such as Vaseline, and a nonstick bandage.  ? Apply more petroleum jelly and replace the bandage as needed.  ?? Be safe with medicines. Take pain medicines exactly as directed.  ? If the doctor gave you a prescription medicine for pain, take it as prescribed.  ? If you are not taking a prescription pain medicine, ask your doctor if you can take an over-the-counter medicine.  To prevent cellulitis in the future  ?? Try to prevent cuts, scrapes, or other injuries to your skin. Cellulitis most often occurs where there is a break in the skin.  ?? If you get a scrape, cut, mild burn, or bite, wash the wound with clean water as soon as you can to help avoid infection. Don't use hydrogen peroxide or alcohol, which can slow healing.  ?? If you have swelling in your legs (edema), support stockings and good skin care may help prevent leg sores and cellulitis.  ?? Take care of your feet, especially if you have diabetes or other conditions that increase the risk of infection. Wear shoes and socks. Do not go barefoot. If you have athlete's foot or other skin problems on your feet, talk to your doctor about how to treat them.  When should you call for help?  Call your doctor now or seek immediate medical care if:  ?? ?? You have signs that your infection is getting worse, such as:  ? Increased pain, swelling, warmth, or redness.  ? Red streaks leading from the area.  ? Pus draining from the area.  ? A fever.   ?? ?? You get a rash.   ??Watch closely for changes in your health, and be sure to contact your doctor if:  ?? ?? You do not get better as expected.   Where can you learn more?  Go  to [facility] at [email or web address]  Select Preferences in the upper right hand corner, then select Health Library under Resources. Enter [phone] in the search box to learn more about Cellulitis: Care Instructions.  Current as of: August 14, 2017  Content Version: 12.1  ?? 2006-2019 [employer]. Care instructions adapted under license by [facility]. If you have questions about a medical condition or this instruction, always ask your healthcare professional. [employer] disclaims any warranty or liability for your use of this information.

## 2017-12-20 NOTE — Unmapped (Signed)
Patient is doing well at this time on the humira  She hasn't seen any benefit yet but she just started in may  She has 2 doses on hand at this time    [facility] Refill Coordination Note    Specialty Medication(s) to be Shipped:   Inflammatory Disorders: Humira    Other medication(s) to be shipped: na     Autumn Mcdonald, DOB: 1970-05-26  Phone: [phone] (home)   Shipping Address: [address]  [place] [address]    All above HIPAA information was verified with patient.     Completed refill call assessment today to schedule patient's medication shipment from the [facility] [phone]).       Specialty medication(s) and dose(s) confirmed: Regimen is correct and unchanged.   Changes to medications: Essa reports no changes reported at this time.  Changes to insurance: No  Questions for the pharmacist: No    The patient will receive an FSI print out for each medication shipped and additional FDA Medication Guides as required.  Patient education from [place] or [name] may also be included in the shipment.    DISEASE/MEDICATION-SPECIFIC INFORMATION        For Rheumatology patients: Next dose of humira from this shipment due on 8/27    ADHERENCE     Medication Adherence    Patient reported X missed doses in the last month:  0  Specialty Medication:  HUMIRA  Patient is on additional specialty medications:  No  Patient is on more than two specialty medications:  No  Any gaps in refill history greater than 2 weeks in the last 3 months:  no  Demonstrates understanding of importance of adherence:  yes  Informant:  patient  Reliability of informant:  reliable  Confirmed plan for next specialty medication refill:  delivery by pharmacy  Refills needed for supportive medications:  not needed          Refill Coordination    Has the Patients' Contact Information Changed:  No  Is the Shipping Address Different:  No         SHIPPING     Shipping address confirmed in FSI.     Delivery Scheduled: Yes, Expected medication delivery date: 8/13 via UPS or courier.     Renette Butters   [facility] Specialty Technician

## 2017-12-25 MED ORDER — ADALIMUMAB 40 MG/0.8 ML SUBCUTANEOUS PEN KIT
SUBCUTANEOUS | 32 refills | 28.00000 days | Status: CP
Start: 2017-12-25 — End: 2019-01-01
  Filled 2017-12-26: qty 4, 28d supply, fill #0

## 2017-12-26 ENCOUNTER — Other Ambulatory Visit: Payer: Self-pay | Admitting: Family Medicine

## 2017-12-26 MED FILL — HUMIRA PEN 40 MG/0.8 ML SUBCUTANEOUS KIT: 28 days supply | Qty: 4 | Fill #0 | Status: AC

## 2017-12-26 NOTE — Telephone Encounter (Signed)
Last OV 07/04/17 Alprazolam last filled 11/28/17 #90 with 1

## 2017-12-26 NOTE — Progress Notes (Deleted)
Subjective:   BRYTNEY SOMES is a 47 y.o. female who presents for Medicare Annual (Subsequent) preventive examination.  Review of Systems:  No ROS.  Medicare Wellness Visit. Additional risk factors are reflected in the social history.    Sleep patterns: CPAP Home Safety/Smoke Alarms: Feels safe in home. Smoke alarms in place.  Living environment; residence and Firearm Safety: Lives with parents and child in 2 story home.  Seat Belt Safety/Bike Helmet: Wears seat belt.   Female:   Pap-       Mammo-06/05/2017, BI-RADS CATEGORY  2: Benign.        Dexa scan-06/05/2017, normal.        CCS-N/A     Objective:     Vitals: There were no vitals taken for this visit.  There is no height or weight on file to calculate BMI.  Advanced Directives 01/05/2017 12/21/2016 01/14/2016 01/14/2016  Does Patient Have a Medical Advance Directive? No No No No  Would patient like information on creating a medical advance directive? Yes (Inpatient - patient defers creating a medical advance directive at this time) Yes (MAU/Ambulatory/Procedural Areas - Information given) No - patient declined information -    Tobacco Social History   Tobacco Use  Smoking Status Never Smoker  Smokeless Tobacco Never Used     Counseling given: Not Answered   Past Medical History:  Diagnosis Date  . Allergic rhinitis   . Ankle fracture, right   . Anxiety   . Carpal tunnel syndrome   . Cervical cancer ([place]) 1998  . Fibromyalgia   . Hyperlipidemia   . Hypertension   . Migraine headache   . Obesity   . Sleep apnea    Past Surgical History:  Procedure Laterality Date  . ABDOMINAL HYSTERECTOMY  10/1996  . ABDOMINAL SURGERY  jan/11/2012   gastric bypass surgery  . CARPAL TUNNEL RELEASE     bilateral   . CESAREAN SECTION    . CHOLECYSTECTOMY  07/1992  . ESOPHAGOGASTRODUODENOSCOPY N/A 01/14/2016   Procedure: ESOPHAGOGASTRODUODENOSCOPY (EGD);  Surgeon: Mauri Pole, MD;  Location: [facility] ENDOSCOPY;  Service:  Endoscopy;  Laterality: N/A;   Family History  Problem Relation Age of Onset  . Diabetes Mother   . Emphysema Mother   . Allergies Mother   . Hypertension Mother   . Heart disease Father   . Allergies Father   . Prostate cancer Father   . Breast cancer Maternal Grandmother    Social History   Socioeconomic History  . Marital status: Single    Spouse name: Not on file  . Number of children: Not on file  . Years of education: Not on file  . Highest education level: Not on file  Occupational History  . 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 Financial resource strain: Not on file  . Food insecurity:    Worry: Not on file    Inability: Not on file  . Transportation needs:    Medical: Not on file    Non-medical: Not on file  Tobacco Use  . Smoking status: Never Smoker  . Smokeless tobacco: Never Used  Substance and Sexual Activity  . Alcohol use: No  . Drug use: No  . Sexual activity: Yes    Comment: TAH  Lifestyle  . Physical activity:    Days per week: Not on file    Minutes per session: Not on file  . Stress: Not on file  Relationships  . Social connections:  Talks on phone: Not on file    Gets together: Not on file    Attends religious service: Not on file    Active member of club or organization: Not on file    Attends meetings of clubs or organizations: Not on file    Relationship status: Not on file  Other Topics Concern  . Not on file  Social History Narrative  . Not on file    Outpatient Encounter Medications as of 12/27/2017  Medication Sig  . ALPRAZolam (XANAX) 0.5 MG tablet TAKE 1 TABLET BY MOUTH 3 TIMES DAILY AS NEEDED FOR ANXIETY OR SLEEP  . ARIPiprazole (ABILIFY) 10 MG tablet Take 10 mg by mouth daily.  . Armodafinil 250 MG tablet Take 250 mg by mouth 2 (two) times daily.   Marland Kitchen BIOTIN PO Take 1 tablet by mouth daily.  . calcium citrate (CALCITRATE - DOSED IN MG ELEMENTAL CALCIUM) 950 MG tablet Take 1 tablet by mouth  daily.  . clonazePAM (KLONOPIN) 1 MG tablet TAKE 1 TABLET BY MOUTH EVERY DAY  . cyclobenzaprine (FLEXERIL) 10 MG tablet Take 1 tablet (10 mg total) by mouth 3 (three) times daily as needed.  . DULoxetine (CYMBALTA) 60 MG capsule Take 2 capsules (120 mg total) by mouth daily.  . FEROSUL 325 (65 Fe) MG tablet TAKE 1 TABLET BY MOUTH EVERY DAY WITH BREAKFAST  . furosemide (LASIX) 40 MG tablet TAKE 2 AND 1/2 TABLETS BY MOUTH EVERY DAY  . gabapentin (NEURONTIN) 600 MG tablet Take 600 mg by mouth 3 (three) times daily.  Marland Kitchen HUMIRA PEN 40 MG/0.8ML PNKT   . HYDROcodone-acetaminophen (NORCO) 10-325 MG tablet TAKE 1 TABLET BY MOUTH EVERY 6 HOURS AS NEEDED  . hydroxychloroquine (PLAQUENIL) 200 MG tablet Take 200 mg by mouth 2 (two) times daily.  Marland Kitchen lamoTRIgine (LAMICTAL) 200 MG tablet Take 200 mg by mouth 2 (two) times daily.   . meclizine (ANTIVERT) 25 MG tablet Take 1 tablet (25 mg total) by mouth 3 (three) times daily as needed for dizziness.  . nebivolol (BYSTOLIC) 10 MG tablet Take 1 tablet (10 mg total) by mouth daily.  Marland Kitchen nystatin (MYCOSTATIN/NYSTOP) powder Apply topically 4 (four) times daily.  Marland Kitchen OVER THE COUNTER MEDICATION [place]  . pantoprazole (PROTONIX) 40 MG tablet TAKE 1 TABLET BY MOUTH 2 TIMES DAILY  . pentoxifylline (TRENTAL) 400 MG CR tablet TAKE 1 TABLET BY MOUTH 3 TIMES DAILY WITH A MEAL  . potassium iodide (SSKI) 1 GM/ML solution Take 25 mg 3 (three) times daily by mouth.  . predniSONE (DELTASONE) 10 MG tablet Take 25 mg daily with breakfast by mouth.  . promethazine (PHENERGAN) 25 MG tablet TAKE 1 TABLET BY MOUTH EVERY 6 HOURS AS NEEDED FOR NAUSEA AND VOMITING  . rosuvastatin (CRESTOR) 20 MG tablet TAKE 1 TABLET BY MOUTH EVERY DAY  . valACYclovir (VALTREX) 1000 MG tablet    No facility-administered encounter medications on file as of 12/27/2017.     Activities of Daily Living In your present state of health, do you have any difficulty performing the following activities:  07/04/2017  Hearing? N  Vision? N  Difficulty concentrating or making decisions? N  Walking or climbing stairs? N  Dressing or bathing? N  Doing errands, shopping? N  Some recent data might be hidden    Patient Care Team: [name], MD as PCP - General Eino Farber, PA-C as Consulting Physician (Physician Assistant) Blima Dessert, MD as Referring Physician (Rheumatology) Loa Socks, MD as Referring Physician (Dermatology) Toney Rakes,  Starlyn Skeans, MD (Gynecology)    Assessment:   This is a routine wellness examination for Angelly.  Exercise Activities and Dietary recommendations   Diet (meal preparation, eat out, water intake, caffeinated beverages, dairy products, fruits and vegetables):   Breakfast: Lunch:  Dinner:      Goals    . Weight (lb) < 320 lb (145.2 kg)     Lose weight by watching portion control, healthy options and increasing activity as tolerated.        Fall Risk Fall Risk  12/21/2016 11/24/2015 06/12/2015 06/03/2015 04/23/2013  Falls in the past year? No No No Yes Yes  Number falls in past yr: - - - 2 or more 1  Injury with Fall? - - - No No  Risk for fall due to : - - - Impaired balance/gait -  Risk for fall due to: Comment - - - Accidents per patient -  Follow up - - - Falls prevention discussed -    Depression Screen PHQ 2/9 Scores 07/04/2017 12/21/2016 11/24/2015 06/12/2015  PHQ - 2 Score 0 0 0 0  PHQ- 9 Score 0 - - -  Exception Documentation - - - Patient refusal     Cognitive Function        Immunization History  Administered Date(s) Administered  . Influenza Split 03/17/2011  . Influenza Whole 02/15/2010  . Influenza,inj,Quad PF,6+ Mos 05/17/2015, 02/23/2016, 02/13/2017  . Influenza-Unspecified 02/27/2013  . Pneumococcal Polysaccharide-23 03/17/2011  . Td 05/16/2002    Screening Tests Health Maintenance  Topic Date Due  . INFLUENZA VACCINE  12/14/2017  . TETANUS/TDAP  04/03/2018 (Originally 05/16/2012)  . HIV Screening  04/03/2018  (Originally 12/04/1984)       Plan:     I have personally reviewed and noted the following in the patient's chart:   . Medical and social history . Use of alcohol, tobacco or illicit drugs  . Current medications and supplements . Functional ability and status . Nutritional status . Physical activity . Advanced directives . List of other physicians . Hospitalizations, surgeries, and ER visits in previous 12 months . Vitals . Screenings to include cognitive, depression, and falls . Referrals and appointments  In addition, I have reviewed and discussed with patient certain preventive protocols, quality metrics, and best practice recommendations. A written personalized care plan for preventive services as well as general preventive health recommendations were provided to patient.     Gerilyn Nestle, RN  12/26/2017

## 2017-12-27 ENCOUNTER — Other Ambulatory Visit: Payer: Self-pay | Admitting: Family Medicine

## 2017-12-27 ENCOUNTER — Ambulatory Visit: Payer: Medicare Other

## 2017-12-27 ENCOUNTER — Encounter: Payer: Self-pay | Admitting: Family Medicine

## 2017-12-27 ENCOUNTER — Encounter: Payer: Medicare Other | Admitting: Family Medicine

## 2017-12-27 NOTE — Telephone Encounter (Signed)
Last OV 07/04/17, Next OV today  Last filled 11/30/17, #90 with 0 refills

## 2017-12-27 NOTE — Telephone Encounter (Signed)
Pt spoken with, had called the [facility]. Encounter closed.

## 2017-12-29 ENCOUNTER — Other Ambulatory Visit: Payer: Self-pay | Admitting: Family Medicine

## 2017-12-29 MED ORDER — CYCLOBENZAPRINE HCL 10 MG PO TABS: 10.0000 mg | ORAL_TABLET | Freq: Three times a day (TID) | ORAL | 0 refills | Status: DC | PRN

## 2017-12-29 MED ORDER — NEBIVOLOL HCL 10 MG PO TABS: 10.0000 mg | ORAL_TABLET | Freq: Every day | ORAL | 0 refills | Status: DC

## 2017-12-29 MED ORDER — HYDROCODONE-ACETAMINOPHEN 10-325 MG PO TABS: 1.0000 | ORAL_TABLET | Freq: Four times a day (QID) | ORAL | 0 refills | Status: DC | PRN

## 2017-12-29 NOTE — Telephone Encounter (Signed)
Last OV 07/04/17 Clonazepam last filled 10/26/17 #30 with 3 Hydrocodone last filled 11/30/17 #77 with 0 bystolic last filled 08/25/85 #90 with 0

## 2018-01-01 ENCOUNTER — Other Ambulatory Visit: Payer: Self-pay | Admitting: Family Medicine

## 2018-01-10 ENCOUNTER — Encounter: Admit: 2018-01-10 | Discharge: 2018-01-11 | Payer: MEDICARE

## 2018-01-10 DIAGNOSIS — Z79899 Other long term (current) drug therapy: Secondary | ICD-10-CM

## 2018-01-10 DIAGNOSIS — M300 Polyarteritis nodosa: Principal | ICD-10-CM

## 2018-01-10 DIAGNOSIS — M19012 Primary osteoarthritis, left shoulder: Secondary | ICD-10-CM

## 2018-01-10 DIAGNOSIS — L0390 Cellulitis, unspecified: Secondary | ICD-10-CM

## 2018-01-10 DIAGNOSIS — M19011 Primary osteoarthritis, right shoulder: Secondary | ICD-10-CM

## 2018-01-10 DIAGNOSIS — Z9049 Acquired absence of other specified parts of digestive tract: Secondary | ICD-10-CM | POA: Diagnosis not present

## 2018-01-10 DIAGNOSIS — M79606 Pain in leg, unspecified: Secondary | ICD-10-CM | POA: Diagnosis not present

## 2018-01-10 DIAGNOSIS — M797 Fibromyalgia: Secondary | ICD-10-CM | POA: Diagnosis not present

## 2018-01-10 DIAGNOSIS — Z9884 Bariatric surgery status: Secondary | ICD-10-CM | POA: Diagnosis not present

## 2018-01-10 DIAGNOSIS — Z8639 Personal history of other endocrine, nutritional and metabolic disease: Secondary | ICD-10-CM | POA: Diagnosis not present

## 2018-01-10 DIAGNOSIS — Z90710 Acquired absence of both cervix and uterus: Secondary | ICD-10-CM | POA: Diagnosis not present

## 2018-01-10 DIAGNOSIS — L02411 Cutaneous abscess of right axilla: Secondary | ICD-10-CM | POA: Diagnosis not present

## 2018-01-10 DIAGNOSIS — I890 Lymphedema, not elsewhere classified: Secondary | ICD-10-CM | POA: Diagnosis not present

## 2018-01-10 DIAGNOSIS — I10 Essential (primary) hypertension: Secondary | ICD-10-CM | POA: Diagnosis not present

## 2018-01-10 DIAGNOSIS — Z888 Allergy status to other drugs, medicaments and biological substances status: Secondary | ICD-10-CM | POA: Diagnosis not present

## 2018-01-10 DIAGNOSIS — Z882 Allergy status to sulfonamides status: Secondary | ICD-10-CM | POA: Diagnosis not present

## 2018-01-10 DIAGNOSIS — Z23 Encounter for immunization: Secondary | ICD-10-CM | POA: Diagnosis not present

## 2018-01-10 DIAGNOSIS — F329 Major depressive disorder, single episode, unspecified: Secondary | ICD-10-CM | POA: Diagnosis not present

## 2018-01-10 DIAGNOSIS — Z886 Allergy status to analgesic agent status: Secondary | ICD-10-CM | POA: Diagnosis not present

## 2018-01-10 DIAGNOSIS — L409 Psoriasis, unspecified: Secondary | ICD-10-CM | POA: Diagnosis not present

## 2018-01-10 DIAGNOSIS — Z881 Allergy status to other antibiotic agents status: Secondary | ICD-10-CM | POA: Diagnosis not present

## 2018-01-10 LAB — CBC W/ AUTO DIFF
BASOPHILS RELATIVE PERCENT: 0.6 %
EOSINOPHILS ABSOLUTE COUNT: 0.4 10*9/L (ref 0.0–0.4)
EOSINOPHILS RELATIVE PERCENT: 6 %
HEMATOCRIT: 40.7 % (ref 36.0–46.0)
HEMOGLOBIN: 12.8 g/dL (ref 12.0–16.0)
LARGE UNSTAINED CELLS: 2 % (ref 0–4)
LYMPHOCYTES ABSOLUTE COUNT: 1.8 10*9/L (ref 1.5–5.0)
LYMPHOCYTES RELATIVE PERCENT: 26.5 %
MEAN CORPUSCULAR HEMOGLOBIN CONC: 31.5 g/dL (ref 31.0–37.0)
MEAN CORPUSCULAR HEMOGLOBIN: 27.5 pg (ref 26.0–34.0)
MEAN PLATELET VOLUME: 7.1 fL (ref 7.0–10.0)
MONOCYTES ABSOLUTE COUNT: 0.7 10*9/L (ref 0.2–0.8)
MONOCYTES RELATIVE PERCENT: 9.8 %
NEUTROPHILS ABSOLUTE COUNT: 3.8 10*9/L (ref 2.0–7.5)
NEUTROPHILS RELATIVE PERCENT: 54.7 %
PLATELET COUNT: 293 10*9/L (ref 150–440)
RED BLOOD CELL COUNT: 4.66 10*12/L (ref 4.00–5.20)
RED CELL DISTRIBUTION WIDTH: 14.3 % (ref 12.0–15.0)
WBC ADJUSTED: 6.9 10*9/L (ref 4.5–11.0)

## 2018-01-10 LAB — COMPREHENSIVE METABOLIC PANEL
ALBUMIN: 3.9 g/dL (ref 3.5–5.0)
ALKALINE PHOSPHATASE: 129 U/L — ABNORMAL HIGH (ref 38–126)
ALT (SGPT): 9 U/L — ABNORMAL LOW (ref 13–69)
ANION GAP: 11 mmol/L (ref 9–15)
AST (SGOT): 22 U/L (ref 17–47)
BLOOD UREA NITROGEN: 8 mg/dL (ref 7–21)
BUN / CREAT RATIO: 14
CHLORIDE: 98 mmol/L (ref 98–107)
CO2: 28 mmol/L (ref 22.0–30.0)
CREATININE: 0.58 mg/dL — ABNORMAL LOW (ref 0.60–1.00)
EGFR CKD-EPI AA FEMALE: >90 mL/min/{1.73_m2} (ref >=60)
EGFR CKD-EPI NON-AA FEMALE: >90 mL/min/{1.73_m2} (ref >=60)
GLUCOSE RANDOM: 53 mg/dL — ABNORMAL LOW (ref 65–179)
POTASSIUM: 3.9 mmol/L (ref 3.5–5.0)
PROTEIN TOTAL: 6.8 g/dL (ref 6.5–8.3)
SODIUM: 137 mmol/L (ref 135–145)

## 2018-01-10 LAB — LYMPHOCYTES RELATIVE PERCENT: Lab: 26.5

## 2018-01-10 LAB — C-REACTIVE PROTEIN: C reactive protein:MCnc:Pt:Ser/Plas:Qn:: 9.4

## 2018-01-10 LAB — ERYTHROCYTE SEDIMENTATION RATE: Lab: 23 — ABNORMAL HIGH

## 2018-01-10 LAB — GLUCOSE RANDOM: Glucose:MCnc:Pt:Ser/Plas:Qn:: 53 — ABNORMAL LOW

## 2018-01-10 MED ORDER — MOXIFLOXACIN 400 MG TABLET
ORAL_TABLET | Freq: Every day | ORAL | 0 refills | 0 days | Status: CP
Start: 2018-01-10 — End: 2018-01-20

## 2018-01-10 MED ORDER — DICLOFENAC 1 % TOPICAL GEL
Freq: Four times a day (QID) | TOPICAL | 6 refills | 0.00000 days | Status: CP
Start: 2018-01-10 — End: 2019-01-10

## 2018-01-10 NOTE — Unmapped (Addendum)
Abscess in right axilla. Will start moxifloxacin for ten days. If worsening or no improving, call dermatology to see if they will lance and drain the lesion.    Apply diclofenac (Voltarent) gel on bilateral shoulders for pain relief. Watch out for rash/hives as potential allergy.    Continue current regimen otherwise.     Please schedule evaluation with allergy regarding medication allergies.

## 2018-01-10 NOTE — Unmapped (Signed)
Patient Name: Autumn Mcdonald  PCP: ??Frederico Hamman, MD  History: Patient and records  Date of visit: 01/10/18    Chief Compliant: Cutaneous PAN vs. erythema induratum/nodular vasculitis    HPI: Autumn Mcdonald is a 47 y.o. 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 Biopsies in 2016 showed small vessel vasculitis suggestive for PAN in 2016. Started prednisone taper and then mtx in 11/2014. Plaquenil started in 12/2014. Started imuran in 07/2015, and titrated up to 150 mg qd w/o sufficient control of her PAN despite being on methotrexate and hydroxychloroquine concurrently. Thus, in 09/2015 she was started on mycophenolate mofetil, and titrated up to 1500 mg BID. She continued to have persistent leg swelling and pain despite maximized cellcept. There was concern high doses of prednisone was contributing to leg swelling at this point so prednisone tapered off but leg pain worsened off. Given this, she was re-biopsied. Biopsy from 08/2016 more consistent with venous stasis disease, so it was unclear if her symptoms were due to PAN vs venous stasis. As a trial for treatment of PAN, we discontinued mycophenolate mofetil and she was started on dapsone by dermatology. Patient developed LFT abnormalities on dapsone and had worsening of leg symptoms so dapsone was discontinued. Repeat biopsy performed again with findings this time suggestive for erythema induratum/nodular vasculitis so patient was started on oral potassium iodide and receiving intralesional steroid injections starting in November/December 2018. Was evaluated by vascular surgery and no vascular insufficiency was noted, thus her leg swelling has been attributed to lymphedema. Evaluated at lymphedema clinic at [facility] this past summer with recommendation for compression stocking. Placed on pentoxifylline. At follow-up in January 2019, she had developed new skin lesions concerning for psoriasis while taking hydroxychloroquine 200 mg po bid, prednisone 20 mg po qd, and potassium iodide qid. Evaluation by Dermatology confirmed plaque psoriasis. Humira was added in March 2019 by Dermatology.  At follow-up in Dermatology in April 2019, she was advised to taper prednisone.  She was last seen in follow-up in May 2019 by myself and discontinued hydroxychloroquine with concerns that this was aggravating psoriasis newly diagnosed.  Most recently saw Dermatology on December 14, 2017.  She returns today for follow-up.    She has been able to taper off prednisone following dermatology appointment. She has been able to go three weeks without a major flare. She did have episode of cellulitis in July 2019 requiring antibiotic therapy. She did continue adalimumab every other week and potassium iodide qid through this episode of cellulitis.  She is also on pentoxifylline.  She thinks her fibromyalgia maybe more active. She felt like her bones are crushing. She still has not received compression stockings but has appointment to receive on September 3rd.  She has no major concerns today.  This is the longest she has been able to be without oral prednisone.  Since May 2019, she has lost around 30 lbs. Psoriatic lesions are improving in size. Has had bleeding ulcers from NSAID PO intake in the past. Does not recall hives although this is listed.       ROS??: Attests to the above, otherwise, review of all other systems is negative.    ????Record Review: Available records were reviewed, including pertinent office visits, labs, and imaging.      Past Medical and Surgical History:  ??  Patient Active Problem List    Diagnosis Date Noted   ??? Melena 02/16/2017   ??? Chronic narcotic use 09/07/2016   ???  Bleeding stomach ulcer 01/20/2016   ??? Acute blood loss anemia 01/14/2016   ??? BRBPR (bright red blood per rectum) 01/14/2016   ??? Neuropathy 12/09/2015   ??? Migraine 11/28/2014   ??? Cutaneous polyarteritis nodosa (CMS-HCC) 11/28/2014   ??? Chronic gout of multiple sites 02/25/2014   ??? Gastroesophageal reflux disease 01/08/2014   ??? Leg pain, lateral 12/11/2012   ??? History of surgical procedure 10/31/2012   ??? Psoriasis 01/09/2012   ??? Degeneration of intervertebral disc of lumbar region 11/30/2011   ??? Cutaneous fungal infection 11/15/2011   ??? DJD (degenerative joint disease) 10/14/2011   ??? Fibromyalgia 10/14/2011   ??? Frequent urination 04/13/2011   ??? Obstructive sleep apnea syndrome 11/16/2010   ??? Elevated blood sugar 08/23/2010   ??? Other abnormal glucose 10/08/2009   ??? Contracture of tendon 08/17/2009   ??? Pain in limb 11/05/2008   ??? Other malaise and fatigue 09/19/2008   ??? Ingrowing nail 06/17/2008   ??? Adverse drug reaction 04/03/2008   ??? Adiposity 01/03/2007   ??? Hyperlipidemia 09/22/2006   ??? Pain in joint, lower leg 09/22/2006   ??? Muscle pain 09/20/2006   ??? Anxiety state 06/28/2006   ??? Essential hypertension 06/28/2006     Past Medical History:   Diagnosis Date   ??? Allergic     See other notes   ??? Arthritis 05/16/2001   ??? Bleeding gastric ulcer    ??? Cervical cancer (CMS-HCC)    ??? Depression    ??? Disorder of skin or subcutaneous tissue 2008    Diagnosised in   ??? Eczema 05/16/2014   ??? Fibromyalgia    ??? Hypertension    ??? Morbid obesity (CMS-HCC)    ??? Psoriasis Last 2 years     Past Surgical History:   Procedure Laterality Date   ??? CESAREAN SECTION  1994   ??? CHOLECYSTECTOMY  March 1994   ??? GASTRIC BYPASS  January 2014   ??? HYSTERECTOMY  June 1998   ??? SKIN BIOPSY  2017     Allergies:   ??  Allergies   Allergen Reactions   ??? Iohexol Hives     Pt treated with PO benedryl   ??? Keflex [Cephalexin] Shortness Of Breath and Hives   ??? Lincomycin Hcl Hives   ??? Lisinopril Hives   ??? Sulfa (Sulfonamide Antibiotics) Swelling and Rash   ??? Sulfamethoxazole-Trimethoprim Hives     REACTION: Rash, throat closed, eyes swelled.   ??? Aspirin      REACTION: Hives   ??? Doxycycline      REACTION: Hives   ??? Naproxen      REACTION: Hives   ??? Niacin      REACTION: Swelling, problems breathing   ??? Benzoin Compound Rash   ??? Pneumococcal 23-Val Ps Vaccine Rash     cellulitis     Current Outpatient Medications:  ??  Current Outpatient Medications   Medication Sig Dispense Refill   ??? adalimumab (HUMIRA) 40 mg/0.8 mL subcutaneous pen kit Inject the contents of 1 pen (40 mg total) under the skin once a week. 4 each 32   ??? ALPRAZolam (XANAX) 0.5 MG tablet TAKE 1 TABLET BY MOUTH 3 TIMES DAILY AS NEEDED FOR ANXIETY OR SLEEP     ??? ARIPiprazole (ABILIFY) 10 MG tablet Take 10 mg by mouth daily.     ??? armodafinil (NUVIGIL) 250 mg tablet Take 250 mg by mouth three (3) times a day.      ??? clonazePAM (KLONOPIN) 1 MG tablet  TAKE 1 TABLET BY MOUTH EVERY DAY     ??? cyclobenzaprine (FLEXERIL) 10 MG tablet Take 10 mg by mouth.     ??? DULoxetine (CYMBALTA) 60 MG capsule Take 60 mg by mouth daily. 120 mg daily     ??? empty container Misc USE AS DIRECTED 1 each 2   ??? ferrous sulfate 325 (65 FE) MG tablet Take 1 tablet (325 mg total) by mouth daily with breakfast.  6   ??? furosemide (LASIX) 40 MG tablet Take 40 mg by mouth Two (2) times a day.     ??? gabapentin (NEURONTIN) 600 MG tablet TAKE 1 TABLET BY MOUTH 3 TIMES DAILY 270 tablet 3   ??? halobetasol (ULTRAVATE) 0.05 % ointment APPLY TO AFFECTED AREA 2 TIMES DAILY AS NEEDED. AVOID FACE AND folds. 50 g 1   ??? HYDROcodone-acetaminophen (NORCO 10-325) 10-325 mg per tablet Take 1 tablet by mouth.     ??? mupirocin (BACTROBAN) 2 % ointment APPLY TOPICALLY 3 TIMES DAILY UNTIL AREA HEALED 22 g 1   ??? nebivolol (BYSTOLIC) 10 MG tablet Take 10 mg by mouth.     ??? OXcarbazepine (TRILEPTAL) 150 MG tablet TAKE 1 TABLET BY MOUTH 2 TIMES DAILY  0   ??? pantoprazole (PROTONIX) 40 MG tablet Take 40 mg by mouth Two (2) times a day. bid     ??? pentoxifylline (TRENTAL) 400 mg CR tablet TAKE 1 TABLET BY MOUTH 3 TIMES DAILY WITH A MEAL 90 tablet 2   ??? potassium iodide (SSKI) 1 gram/mL solution 5 drops (0.21ml) three times daily in OJ. 237 mL 1   ??? promethazine (PHENERGAN) 25 MG tablet      ??? rosuvastatin (CRESTOR) 20 MG tablet TAKE 1 TABLET BY MOUTH EVERY DAY       No current facility-administered medications for this visit.         Immunization History   Administered Date(s) Administered   ??? INFLUENZA TIV (TRI) 15MO+ W/ PRESERV (IM) 03/17/2011   ??? Influenza Vaccine Quad (IIV4 PF) 27mo+ injectable 02/17/2015, 05/17/2015, 02/23/2016, 02/13/2017   ??? Influenza Virus Vaccine, unspecified formulation 02/27/2013, 06/17/2015, 03/14/2016   ??? Influenza Whole 02/15/2010   ??? PNEUMOCOCCAL POLYSACCHARIDE 23 03/17/2011   ??? Pneumococcal Conjugate 13-Valent 10/04/2017   ??? TdaP 01/05/2016   ??? Tetanus and diptheria,(adult), adsorbed, 2Lf tetanus toxoid, PF 05/16/2002     ????  PHYSICAL EXAM??  Vitals:    01/10/18 1358   BP: 127/63   BP Site: L Arm   BP Position: Sitting   BP Cuff Size: Large   Pulse: 94   Temp: 36.5 ??C (97.7 ??F)   TempSrc: Oral   Weight: (!) 166 kg (366 lb)   Height: 158.8 cm (5' 2.52)     General:   Pleasant 47 y.o.female in no acute distress, obese    Eyes:   PERRL, conjunctiva and sclera not inflamed. Tears appear adequate.    ENT:   No oropharyngeal lesions. Mucous membranes moist.    Lymph:   No masses or cervical lymphadenopathy. No significant edema   Cardiovascular:  Regular rate and rhythm. No murmur, rub, or gallop.   Lungs:  Clear to auscultation.Normal respiratory effort.    Musculoskeletal:   Ambulates w/o assistance. Pain in bilateral shoulder with limited ROM actively. Bilateral shoulder with full ROM passively but reports discomfort. Hands, wrists, elbows wnl.  Knees with some restricted flexion due to body habitus. Hips with full ROM. No pain in feet/ankles.  No significant myofascial tenderness except at  bilateral shoulders with diffuse pain and tenderness.   Neurological:  CN 2-12 grossly intact. 5/5 strength on extremities.   Psych:  Appropriate affect and mood   Skin:  B/l shins with erythema and skin thickening with small area of skin breakdown on the right which patient says is improving. Right axilla there is a small abscess/folliculitis.          Recent Results (from the past 1008 hour(s))   TSH    Collection Time: 12/14/17  3:08 PM   Result Value Ref Range    TSH 3.147 0.600 - 3.300 uIU/mL   T4, free    Collection Time: 12/14/17  3:08 PM   Result Value Ref Range    Free T4 1.01 0.71 - 1.40 ng/dL   T3    Collection Time: 12/14/17  3:08 PM   Result Value Ref Range    T3, Total 1.6 1.0 - 1.7 ng/mL   CBC w/ Differential    Collection Time: 01/10/18  3:01 PM   Result Value Ref Range    WBC 6.9 4.5 - 11.0 10*9/L    RBC 4.66 4.00 - 5.20 10*12/L    HGB 12.8 12.0 - 16.0 g/dL    HCT 24.4 01.0 - 27.2 %    MCV 87.2 80.0 - 100.0 fL    MCH 27.5 26.0 - 34.0 pg    MCHC 31.5 31.0 - 37.0 g/dL    RDW 53.6 64.4 - 03.4 %    MPV 7.1 7.0 - 10.0 fL    Platelet 293 150 - 440 10*9/L    Neutrophils % 54.7 %    Lymphocytes % 26.5 %    Monocytes % 9.8 %    Eosinophils % 6.0 %    Basophils % 0.6 %    Absolute Neutrophils 3.8 2.0 - 7.5 10*9/L    Absolute Lymphocytes 1.8 1.5 - 5.0 10*9/L    Absolute Monocytes 0.7 0.2 - 0.8 10*9/L    Absolute Eosinophils 0.4 0.0 - 0.4 10*9/L    Absolute Basophils 0.0 0.0 - 0.1 10*9/L    Large Unstained Cells 2 0 - 4 %    Hypochromasia Slight (A) Not Present     ??GENERAL SUMMARY/IMPRESSION ??AND RECOMMENDATIONS: ??  Overall, this is a 47 y.o. 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  She does continue to have redness in her lower extremity but this is less tender and nodular than it has been in the past. She has been able to successfully taper of prednisone and stay off for three weeks now which is the longest she has been without prednisone. With the taper and discontinuation of prednisone, she has been able to lose 30 lbs and was congratulated on her efforts. Her exam was notable for likely abscess forming in the right axilla possibly from local folliculitis and she does has some wounds on RLE which she notes is healing but was previously noted to be infected. Given the abscess forming and her multiple medication allergies, will treat with moxifloxacin. She was advised that if she notes worsening or sees no improvement that she should see dermatology to have the lesion possibly incised and drained. The bilateral shoulder pain appears mechanical with maybe some early OA. Will start diclofenac gel for pain relief as she has had gastric ulcers with bleeding from oral NSAID use in the past. Labs today for medication monitoring and assessing disease  activity. She has referral from Dermatology to see Allergy to clarify her multiple antibiotic allergies as this is limiting therapy for her at times of infection. She was advised to schedule evaluation with Allergy/Immunology.  Follow-up in 3-4 months with myself.       Diagnosis ICD-10-CM Associated Orders   1. Cutaneous polyarteritis nodosa (CMS-HCC) M30.0 CBC w/ Differential     Comprehensive Metabolic Panel     CRP  C-Reactive Protein     ESR Sed rate   2. High risk medication use Z79.899 CBC w/ Differential     Comprehensive Metabolic Panel     CRP  C-Reactive Protein     ESR Sed rate   3. Cellulitis, unspecified cellulitis site L03.90 moxifloxacin (AVELOX) 400 mg tablet   4. Primary osteoarthritis of both shoulders M19.011 diclofenac sodium (VOLTAREN) 1 % gel    M19.012      Patient Instructions   Abscess in right axilla. Will start moxifloxacin for ten days. If worsening or no improving, call dermatology to see if they will lance and drain the lesion.    Apply diclofenac (Voltarent) gel on bilateral shoulders for pain relief. Watch out for rash/hives as potential allergy.    Continue current regimen otherwise.     Please schedule evaluation with allergy regarding medication allergies.    Melynda Krzywicki C. Scarlette Calico, MD, PhD  Assistant Professor of Medicine  Department of Medicine/Division of Rheumatology  [facility]          ?

## 2018-01-11 ENCOUNTER — Ambulatory Visit: Payer: Medicare Other | Admitting: Family Medicine

## 2018-01-17 MED ORDER — PENTOXIFYLLINE ER 400 MG TABLET,EXTENDED RELEASE
ORAL_TABLET | 2 refills | 0.00000 days | Status: CP
Start: 2018-01-17 — End: 2018-02-12

## 2018-01-18 NOTE — Unmapped (Signed)
Patient is doing well on the humira at this time  She hasn't missed any doses  She's got 2 on hand at this time-one to use tonight    [facility] Refill Coordination Note    Specialty Medication(s) to be Shipped:   Inflammatory Disorders: Humira    Other medication(s) to be shipped: n/a     [name], DOB: Sep 30, 1970  Phone: [phone] (home)   Shipping Address: [address]  [place] [address]    All above HIPAA information was verified with patient.     Completed refill call assessment today to schedule patient's medication shipment from the [facility] [phone]).       Specialty medication(s) and dose(s) confirmed: Regimen is correct and unchanged.   Changes to medications: Leighana reports no changes reported at this time.  Changes to insurance: No  Questions for the pharmacist: No    The patient will receive a drug information handout for each medication shipped and additional FDA Medication Guides as required.      DISEASE/MEDICATION-SPECIFIC INFORMATION        For Inflammatory disorders patients on injectable medications: Patient currently has 2 doses left.  Next injection is scheduled for tonight ( new box on 9/19).    ADHERENCE        She hasn't missed any doses since her last shipment      SHIPPING     Shipping address confirmed in Epic.     Delivery Scheduled: Yes, Expected medication delivery date: 9/11 via UPS or courier.     Renette Butters   [facility] Specialty Technician

## 2018-01-23 MED FILL — HUMIRA PEN 40 MG/0.8 ML SUBCUTANEOUS KIT: SUBCUTANEOUS | 28 days supply | Qty: 4 | Fill #1

## 2018-01-23 MED FILL — HUMIRA PEN 40 MG/0.8 ML SUBCUTANEOUS KIT: 28 days supply | Qty: 4 | Fill #1 | Status: AC

## 2018-01-28 ENCOUNTER — Other Ambulatory Visit: Payer: Self-pay | Admitting: Family Medicine

## 2018-01-29 MED ORDER — HYDROCODONE-ACETAMINOPHEN 10-325 MG PO TABS: 1.0000 | ORAL_TABLET | Freq: Four times a day (QID) | ORAL | 0 refills | Status: DC | PRN

## 2018-01-29 NOTE — Telephone Encounter (Signed)
Last OV 07/04/17 Hydrocodone last filled 12/29/17 #90 with 0

## 2018-01-30 ENCOUNTER — Other Ambulatory Visit: Payer: Self-pay | Admitting: Family Medicine

## 2018-01-30 NOTE — Telephone Encounter (Signed)
Last OV 07/04/17 Alprazolam last filled 11/28/17 #90 with 1

## 2018-01-31 NOTE — Progress Notes (Deleted)
Subjective:   [name] is a 47 y.o. female who presents for Medicare Annual (Subsequent) preventive examination.  Review of Systems:  No ROS.  Medicare Wellness Visit. Additional risk factors are reflected in the social history.    Sleep patterns:  ??CPAP Home Safety/Smoke Alarms: Feels safe in home. Smoke alarms in place.  Living environment; residence and Firearm Safety: Lives with parents and child in 2 story home.  Seat Belt Safety/Bike Helmet: Wears seat belt.   Female:   Pap- Followed by GYN     Mammo-06/05/2017, BI-RADS CATEGORY  2: Benign.      Dexa scan- 06/05/2017, normal.        CCS-N/A     Objective:     Vitals: There were no vitals taken for this visit.  There is no height or weight on file to calculate BMI.  Advanced Directives 01/05/2017 12/21/2016 01/14/2016 01/14/2016  Does Patient Have a Medical Advance Directive? No No No No  Would patient like information on creating a medical advance directive? Yes (Inpatient - patient defers creating a medical advance directive at this time) Yes (MAU/Ambulatory/Procedural Areas - Information given) No - patient declined information -    Tobacco Social History   Tobacco Use  Smoking Status Never Smoker  Smokeless Tobacco Never Used     Counseling given: Not Answered    Past Medical History:  Diagnosis Date  . Allergic rhinitis   . Ankle fracture, right   . Anxiety   . Carpal tunnel syndrome   . Cervical cancer ([place]) 1998  . Fibromyalgia   . Hyperlipidemia   . Hypertension   . Migraine headache   . Obesity   . Sleep apnea    Past Surgical History:  Procedure Laterality Date  . ABDOMINAL HYSTERECTOMY  10/1996  . ABDOMINAL SURGERY  jan/11/2012   gastric bypass surgery  . CARPAL TUNNEL RELEASE     bilateral   . CESAREAN SECTION    . CHOLECYSTECTOMY  07/1992  . ESOPHAGOGASTRODUODENOSCOPY N/A 01/14/2016   Procedure: ESOPHAGOGASTRODUODENOSCOPY (EGD);  Surgeon: Mauri Pole, MD;  Location: [facility]  ENDOSCOPY;  Service: Endoscopy;  Laterality: N/A;   Family History  Problem Relation Age of Onset  . Diabetes Mother   . Emphysema Mother   . Allergies Mother   . Hypertension Mother   . Heart disease Father   . Allergies Father   . Prostate cancer Father   . Breast cancer Maternal Grandmother    Social History   Socioeconomic History  . Marital status: Single    Spouse name: Not on file  . Number of children: Not on file  . Years of education: Not on file  . Highest education level: Not on file  Occupational History  . 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 Financial resource strain: Not on file  . Food insecurity:    Worry: Not on file    Inability: Not on file  . Transportation needs:    Medical: Not on file    Non-medical: Not on file  Tobacco Use  . Smoking status: Never Smoker  . Smokeless tobacco: Never Used  Substance and Sexual Activity  . Alcohol use: No  . Drug use: No  . Sexual activity: Yes    Comment: TAH  Lifestyle  . Physical activity:    Days per week: Not on file    Minutes per session: Not on file  . Stress: Not on file  Relationships  .  Social connections:    Talks on phone: Not on file    Gets together: Not on file    Attends religious service: Not on file    Active member of club or organization: Not on file    Attends meetings of clubs or organizations: Not on file    Relationship status: Not on file  Other Topics Concern  . Not on file  Social History Narrative  . Not on file    Outpatient Encounter Medications as of 02/01/2018  Medication Sig  . ALPRAZolam (XANAX) 0.5 MG tablet TAKE 1 TABLET BY MOUTH 3 TIMES DAILY AS NEEDED FOR ANXIETY OR SLEEP  . ARIPiprazole (ABILIFY) 10 MG tablet Take 10 mg by mouth daily.  . Armodafinil 250 MG tablet Take 250 mg by mouth 2 (two) times daily.   Marland Kitchen BIOTIN PO Take 1 tablet by mouth daily.  . calcium citrate (CALCITRATE - DOSED IN MG ELEMENTAL CALCIUM) 950 MG tablet  Take 1 tablet by mouth daily.  . clonazePAM (KLONOPIN) 1 MG tablet TAKE 1 TABLET BY MOUTH EVERY DAY  . cyclobenzaprine (FLEXERIL) 10 MG tablet Take 1 tablet (10 mg total) by mouth 3 (three) times daily as needed.  . DULoxetine (CYMBALTA) 60 MG capsule Take 2 capsules (120 mg total) by mouth daily.  . FEROSUL 325 (65 Fe) MG tablet TAKE 1 TABLET BY MOUTH EVERY DAY WITH BREAKFAST  . furosemide (LASIX) 40 MG tablet TAKE 2 AND 1/2 TABLETS BY MOUTH EVERY DAY  . gabapentin (NEURONTIN) 600 MG tablet Take 600 mg by mouth 3 (three) times daily.  Marland Kitchen HUMIRA PEN 40 MG/0.8ML PNKT   . HYDROcodone-acetaminophen (NORCO) 10-325 MG tablet Take 1 tablet by mouth every 6 (six) hours as needed.  . hydroxychloroquine (PLAQUENIL) 200 MG tablet Take 200 mg by mouth 2 (two) times daily.  Marland Kitchen lamoTRIgine (LAMICTAL) 200 MG tablet Take 200 mg by mouth 2 (two) times daily.   . meclizine (ANTIVERT) 25 MG tablet Take 1 tablet (25 mg total) by mouth 3 (three) times daily as needed for dizziness.  . nebivolol (BYSTOLIC) 10 MG tablet Take 1 tablet (10 mg total) by mouth daily.  Marland Kitchen nystatin (MYCOSTATIN/NYSTOP) powder Apply topically 4 (four) times daily.  Marland Kitchen OVER THE COUNTER MEDICATION [place] c[place]  . pantoprazole (PROTONIX) 40 MG tablet TAKE 1 TABLET BY MOUTH 2 TIMES DAILY  . pentoxifylline (TRENTAL) 400 MG CR tablet TAKE 1 TABLET BY MOUTH 3 TIMES DAILY WITH A MEAL  . potassium iodide (SSKI) 1 GM/ML solution Take 25 mg 3 (three) times daily by mouth.  . predniSONE (DELTASONE) 10 MG tablet Take 25 mg daily with breakfast by mouth.  . promethazine (PHENERGAN) 25 MG tablet TAKE 1 TABLET BY MOUTH EVERY 6 HOURS AS NEEDED FOR NAUSEA AND VOMITING  . rosuvastatin (CRESTOR) 20 MG tablet TAKE 1 TABLET BY MOUTH EVERY DAY  . valACYclovir (VALTREX) 1000 MG tablet    No facility-administered encounter medications on file as of 02/01/2018.     Activities of Daily Living In your present state of health, do you have any difficulty performing the  following activities: 07/04/2017  Hearing? N  Vision? N  Difficulty concentrating or making decisions? N  Walking or climbing stairs? N  Dressing or bathing? N  Doing errands, shopping? N  Some recent data might be hidden    Patient Care Team: [name], MD as PCP - General Eino Farber, PA-C as Consulting Physician (Physician Assistant) Blima Dessert, MD as Referring Physician (Rheumatology) Loa Socks,  MD as Referring Physician (Dermatology) Terrance Mass, MD (Gynecology)    Assessment:   This is a routine wellness examination for Corazon.  Exercise Activities and Dietary recommendations   Diet (meal preparation, eat out, water intake, caffeinated beverages, dairy products, fruits and vegetables):   Breakfast: Lunch:  Dinner:      Goals    . Weight (lb) < 320 lb (145.2 kg)     Lose weight by watching portion control, healthy options and increasing activity as tolerated.        Fall Risk Fall Risk  12/21/2016 11/24/2015 06/12/2015 06/03/2015 04/23/2013  Falls in the past year? No No No Yes Yes  Number falls in past yr: - - - 2 or more 1  Injury with Fall? - - - No No  Risk for fall due to : - - - Impaired balance/gait -  Risk for fall due to: Comment - - - Accidents per patient -  Follow up - - - Falls prevention discussed -    Depression Screen PHQ 2/9 Scores 07/04/2017 12/21/2016 11/24/2015 06/12/2015  PHQ - 2 Score 0 0 0 0  PHQ- 9 Score 0 - - -  Exception Documentation - - - Patient refusal     Cognitive Function        Immunization History  Administered Date(s) Administered  . Influenza Split 03/17/2011  . Influenza Whole 02/15/2010  . Influenza,inj,Quad PF,6+ Mos 05/17/2015, 02/23/2016, 02/13/2017  . Influenza-Unspecified 02/27/2013  . Pneumococcal Polysaccharide-23 03/17/2011  . Td 05/16/2002     Screening Tests Health Maintenance  Topic Date Due  . TETANUS/TDAP  04/03/2018 (Originally 05/16/2012)  . HIV Screening  04/03/2018 (Originally  12/04/1984)  . INFLUENZA VACCINE  Completed        Plan:     I have personally reviewed and noted the following in the patient's chart:   . Medical and social history . Use of alcohol, tobacco or illicit drugs  . Current medications and supplements . Functional ability and status . Nutritional status . Physical activity . Advanced directives . List of other physicians . Hospitalizations, surgeries, and ER visits in previous 12 months . Vitals . Screenings to include cognitive, depression, and falls . Referrals and appointments  In addition, I have reviewed and discussed with patient certain preventive protocols, quality metrics, and best practice recommendations. A written personalized care plan for preventive services as well as general preventive health recommendations were provided to patient.     Gerilyn Nestle, RN  01/31/2018

## 2018-02-01 ENCOUNTER — Ambulatory Visit: Payer: Medicare Other

## 2018-02-01 ENCOUNTER — Encounter: Payer: Self-pay | Admitting: Family Medicine

## 2018-02-01 NOTE — Unmapped (Signed)
Please reply back to patients mychart msg states she is in a flare

## 2018-02-05 ENCOUNTER — Encounter: Payer: Self-pay | Admitting: Family Medicine

## 2018-02-05 ENCOUNTER — Other Ambulatory Visit: Payer: Self-pay

## 2018-02-05 ENCOUNTER — Ambulatory Visit (INDEPENDENT_AMBULATORY_CARE_PROVIDER_SITE_OTHER): Payer: Medicare Other | Admitting: Family Medicine

## 2018-02-05 VITALS — BP 125/79 | HR 78 | Temp 98.4°F | Resp 16 | Ht 63.0 in | Wt 369.4 lb

## 2018-02-05 DIAGNOSIS — M797 Fibromyalgia: Secondary | ICD-10-CM

## 2018-02-05 DIAGNOSIS — M300 Polyarteritis nodosa: Secondary | ICD-10-CM

## 2018-02-05 MED ORDER — GABAPENTIN 800 MG PO TABS: 800.0000 mg | ORAL_TABLET | Freq: Three times a day (TID) | ORAL | 1 refills | Status: DC

## 2018-02-05 NOTE — Unmapped (Signed)
Called patient, no answer and the voicemail is full. Sent message via mychart to see what symptoms she is having    [place]

## 2018-02-05 NOTE — Patient Instructions (Signed)
Follow up as scheduled or as needed Increase the Gabapentin to 800mg  3x/day for pain relief Continue the Cymbalta once daily Use the Hydrocodone as needed Try and get regular exercise/activity as this will improve your pain Call with any questions or concerns Hang in there!!!

## 2018-02-05 NOTE — Progress Notes (Signed)
   Subjective:    Patient ID: [name], female    DOB: [birth year], 47 y.o.   MRN: [id number]  HPI Pain- pt saw Rheum 8/28 and indicated that her body and bones were hurting, 'like being squeezed in a vise'.  sxs have been present for over a month.  'it's just everywhere.  Like my whole body is sore'.  Painful to touch.  Pain is localized more to upper body.  Has been able to wean off prednisone.  Currently on Cymbalta 60mg , Gabapentin 600mg  TID, and hydrocodone.   Review of Systems For ROS see HPI     Objective:   Physical Exam  Constitutional: She appears well-developed and well-nourished. No distress.  Morbidly obese  HENT:  Head: Normocephalic and atraumatic.  Lower teeth are missing  Eyes: Pupils are equal, round, and reactive to light. EOM are normal.  Musculoskeletal: She exhibits tenderness (TTP over multiple trigger points). She exhibits no deformity.  Skin: Skin is warm and dry.  Psychiatric: She has a normal mood and affect. Her behavior is normal. Thought content normal.  Vitals reviewed.         Assessment & Plan:

## 2018-02-05 NOTE — Assessment & Plan Note (Signed)
Following at [facility]

## 2018-02-05 NOTE — Assessment & Plan Note (Signed)
Deteriorated.  Pt is having a flare w/ diffuse upper body pain.  Unclear why Rheum told her to follow up here but will increase her Gabapentin to 800mg  TID.  Continue Cymbalta and Hydrocodone prn.  Goal is to avoid restarting prednisone if possible.  Discussed need for regular exercise/physical activity to improve her pain levels.  Offered PT.  Pt declined at this time but will let me know if she doesn't have improvement w/ increased gabapentin dose.  Pt expressed understanding and is in agreement w/ plan.

## 2018-02-12 ENCOUNTER — Other Ambulatory Visit: Payer: Self-pay | Admitting: Family Medicine

## 2018-02-13 MED ORDER — PENTOXIFYLLINE ER 400 MG TABLET,EXTENDED RELEASE
ORAL_TABLET | 2 refills | 0.00000 days | Status: CP
Start: 2018-02-13 — End: 2018-05-11

## 2018-02-14 NOTE — Unmapped (Signed)
Patient missed one dose from her last shipment  She has 3 pens left on hand at this time  She said she just forgot to take it  She will take her next dose Thursday (tomorrow)  She was ok with me setting up delivery for next week (sending next Thursday)    [facility] Refill Coordination Note    Specialty Medication(s) to be Shipped:   Inflammatory Disorders: Humira    Other medication(s) to be shipped: n/a     [name], DOB: 06-23-1970  Phone: [phone] (home)   Shipping Address: [address]  [place] [address]    All above HIPAA information was verified with patient.     Completed refill call assessment today to schedule patient's medication shipment from the [facility] [phone]).       Specialty medication(s) and dose(s) confirmed: Regimen is correct and unchanged.   Changes to medications: Pranathi reports no changes reported at this time.  Changes to insurance: No  Questions for the pharmacist: No    The patient will receive a drug information handout for each medication shipped and additional FDA Medication Guides as required.      DISEASE/MEDICATION-SPECIFIC INFORMATION        For Inflammatory disorders patients on injectable medications: Patient currently has 3 doses left.  Next injection is scheduled for tomorrow.    ADHERENCE     Medication Adherence    Patient reported X missed doses in the last month:  0  Specialty Medication:  humira  Patient is on additional specialty medications:  No  Patient is on more than two specialty medications:  No  Any gaps in refill history greater than 2 weeks in the last 3 months:  no  Demonstrates understanding of importance of adherence:  yes  Informant:  patient  Reliability of informant:  reliable  Confirmed plan for next specialty medication refill:  delivery by pharmacy  Refills needed for supportive medications:  not needed          Refill Coordination    Has the Patients' Contact Information Changed:  No  Is the Shipping Address Different:  No           SHIPPING     Shipping address confirmed in Epic.     Delivery Scheduled: Yes, Expected medication delivery date: 10/10 via UPS or courier.     Renette Butters   [facility] Specialty Technician

## 2018-02-15 ENCOUNTER — Ambulatory Visit: Payer: Medicare Other

## 2018-02-19 ENCOUNTER — Encounter: Payer: Self-pay | Admitting: Family Medicine

## 2018-02-20 MED ORDER — HYDROCODONE-ACETAMINOPHEN 10-325 MG PO TABS: 1.0000 | ORAL_TABLET | Freq: Four times a day (QID) | ORAL | 0 refills | Status: DC | PRN

## 2018-02-21 MED FILL — HUMIRA PEN 40 MG/0.8 ML SUBCUTANEOUS KIT: SUBCUTANEOUS | 28 days supply | Qty: 4 | Fill #2

## 2018-02-21 MED FILL — HUMIRA PEN 40 MG/0.8 ML SUBCUTANEOUS KIT: 28 days supply | Qty: 4 | Fill #2 | Status: AC

## 2018-02-28 ENCOUNTER — Other Ambulatory Visit: Payer: Self-pay | Admitting: Family Medicine

## 2018-02-28 NOTE — Telephone Encounter (Signed)
Last OV 02/05/18 Clonazepam last filled 6/13/419 #30 with 3

## 2018-02-28 NOTE — Telephone Encounter (Signed)
Last OV 02/05/18 Alprazolam last filled 01/31/18 #90 with 1

## 2018-03-01 DIAGNOSIS — F3341 Major depressive disorder, recurrent, in partial remission: Secondary | ICD-10-CM | POA: Diagnosis not present

## 2018-03-01 DIAGNOSIS — F518 Other sleep disorders not due to a substance or known physiological condition: Secondary | ICD-10-CM | POA: Diagnosis not present

## 2018-03-03 ENCOUNTER — Other Ambulatory Visit: Payer: Self-pay | Admitting: Family Medicine

## 2018-03-05 ENCOUNTER — Encounter: Payer: Self-pay | Admitting: Family Medicine

## 2018-03-06 ENCOUNTER — Encounter: Payer: Self-pay | Admitting: Family Medicine

## 2018-03-16 ENCOUNTER — Other Ambulatory Visit: Payer: Self-pay | Admitting: Family Medicine

## 2018-03-16 MED ORDER — HYDROCODONE-ACETAMINOPHEN 10-325 MG PO TABS: 1.0000 | ORAL_TABLET | Freq: Four times a day (QID) | ORAL | 0 refills | Status: DC | PRN

## 2018-03-16 NOTE — Telephone Encounter (Signed)
Last OV 02/05/18  Hydrocodone last filled 02/20/18 #90 with 0

## 2018-03-20 NOTE — Unmapped (Signed)
[facility] Refill and Clinical Coordination Note  Medication(s): Autumn Mcdonald, DOB: 11-02-1970  Phone: [phone] (home) , Alternate phone contact: N/A  Shipping address: [address]  [place] [address]  Phone or address changes today?: No  All above HIPAA information verified.  Insurance changes? No    Completed refill and clinical call assessment today to schedule patient's medication shipment from the [facility] [phone]).      MEDICATION RECONCILIATION    Confirmed the medication and dosage are correct and have not changed: Yes, regimen is correct and unchanged.    Were there any changes to your medication(s) in the past month:  No, there are no changes reported at this time.    ADHERENCE    Is this medicine transplant or covered by Medicare Part B? No.    Not Applicable    Did you miss any doses in the past 4 weeks? No missed doses reported.  Adherence counseling provided? Not needed     SIDE EFFECT MANAGEMENT    Are you tolerating your medication?:  Autumn Mcdonald reports tolerating the medication.  Side effect management discussed: None      Therapy is appropriate and should be continued.    Evidence of clinical benefit: See Epic note from 12/14/17      FINANCIAL/SHIPPING    Delivery Scheduled: Yes, Expected medication delivery date: 11/12     Medication will be delivered via UPS to the home address in [facility].    Additional medications refilled: No additional medications/refills needed at this time.    The patient will receive a drug information handout for each medication shipped and additional FDA Medication Guides as required.      Autumn Mcdonald did not have any additional questions at this time.    Delivery address confirmed in Epic.     We will follow up with patient monthly for standard refill processing and delivery.      Thank you,  Clydell Hakim   [facility] Specialty Pharmacist

## 2018-03-23 ENCOUNTER — Ambulatory Visit: Payer: Medicare Other | Admitting: Family Medicine

## 2018-03-26 ENCOUNTER — Ambulatory Visit: Payer: Medicare Other | Admitting: Family Medicine

## 2018-03-26 MED FILL — HUMIRA PEN 40 MG/0.8 ML SUBCUTANEOUS KIT: SUBCUTANEOUS | 28 days supply | Qty: 4 | Fill #3

## 2018-03-26 MED FILL — HUMIRA PEN 40 MG/0.8 ML SUBCUTANEOUS KIT: 28 days supply | Qty: 4 | Fill #3 | Status: AC

## 2018-04-03 ENCOUNTER — Ambulatory Visit (INDEPENDENT_AMBULATORY_CARE_PROVIDER_SITE_OTHER): Payer: Medicare Other | Admitting: Physician Assistant

## 2018-04-03 ENCOUNTER — Ambulatory Visit: Payer: Medicare Other | Admitting: Nurse Practitioner

## 2018-04-03 ENCOUNTER — Encounter: Payer: Self-pay | Admitting: Physician Assistant

## 2018-04-03 ENCOUNTER — Other Ambulatory Visit: Payer: Self-pay | Admitting: Family Medicine

## 2018-04-03 ENCOUNTER — Other Ambulatory Visit: Payer: Self-pay

## 2018-04-03 VITALS — BP 118/80 | HR 95 | Temp 98.5°F | Resp 16 | Ht 63.0 in | Wt 356.0 lb

## 2018-04-03 DIAGNOSIS — R3915 Urgency of urination: Secondary | ICD-10-CM

## 2018-04-03 LAB — POCT URINALYSIS DIPSTICK
Bilirubin, UA: NEGATIVE
Blood, UA: NEGATIVE
Glucose, UA: NEGATIVE
Ketones, UA: NEGATIVE
Leukocytes, UA: NEGATIVE
Protein, UA: NEGATIVE
Spec Grav, UA: 1.015
Urobilinogen, UA: 0.2 U/dL
pH, UA: 6

## 2018-04-03 NOTE — Progress Notes (Signed)
Acute Office Visit  Subjective:    Patient ID: [name], female    DOB: 1971-01-03, 47 y.o.   MRN: [id number]  Chief Complaint  Patient presents with  . Pelvic Pain   HPI Patient is in today c/0 2 weeks of urinary urgency and frequency. Notes occasional suprapubic discomfort. Denies dysuria, hematuria, nausea or vomiting. Denies plank pain. Initially noted some pelvic pain which has resolved for several day. Notes + history of URI. Is s/p hysterectomy. Notes increase in caffeine recently.   Past Medical History:  Diagnosis Date  . Allergic rhinitis   . Ankle fracture, right   . Anxiety   . Carpal tunnel syndrome   . Cervical cancer ([place]) 1998  . Fibromyalgia   . Hyperlipidemia   . Hypertension   . Migraine headache   . Obesity   . Sleep apnea     Past Surgical History:  Procedure Laterality Date  . ABDOMINAL HYSTERECTOMY  10/1996  . ABDOMINAL SURGERY  jan/11/2012   gastric bypass surgery  . CARPAL TUNNEL RELEASE     bilateral   . CESAREAN SECTION    . CHOLECYSTECTOMY  07/1992  . ESOPHAGOGASTRODUODENOSCOPY N/A 01/14/2016   Procedure: ESOPHAGOGASTRODUODENOSCOPY (EGD);  Surgeon: Mauri Pole, MD;  Location: [facility] ENDOSCOPY;  Service: Endoscopy;  Laterality: N/A;    Family History  Problem Relation Age of Onset  . Diabetes Mother   . Emphysema Mother   . Allergies Mother   . Hypertension Mother   . Heart disease Father   . Allergies Father   . Prostate cancer Father   . Breast cancer Maternal Grandmother     Social History   Socioeconomic History  . Marital status: Single    Spouse name: Not on file  . Number of children: Not on file  . Years of education: Not on file  . Highest education level: Not on file  Occupational History  . 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 Financial resource strain: Not on file  . Food insecurity:    Worry: Not on file    Inability: Not on file  . Transportation needs:    Medical: Not on  file    Non-medical: Not on file  Tobacco Use  . Smoking status: Never Smoker  . Smokeless tobacco: Never Used  Substance and Sexual Activity  . Alcohol use: No  . Drug use: No  . Sexual activity: Yes    Comment: TAH  Lifestyle  . Physical activity:    Days per week: Not on file    Minutes per session: Not on file  . Stress: Not on file  Relationships  . Social connections:    Talks on phone: Not on file    Gets together: Not on file    Attends religious service: Not on file    Active member of club or organization: Not on file    Attends meetings of clubs or organizations: Not on file    Relationship status: Not on file  . Intimate partner violence:    Fear of current or ex partner: Not on file    Emotionally abused: Not on file    Physically abused: Not on file    Forced sexual activity: Not on file  Other Topics Concern  . Not on file  Social History Narrative  . Not on file    Outpatient Medications Prior to Visit  Medication Sig Dispense Refill  . Adalimumab 40 MG/0.8ML PNKT Inject  into the skin.    Marland Kitchen ALPRAZolam (XANAX) 0.5 MG tablet TAKE 1 TABLET BY MOUTH 3 TIMES DAILY AS NEEDED FOR ANXIETY OR SLEEP 90 tablet 1  . ARIPiprazole (ABILIFY) 10 MG tablet Take 10 mg by mouth daily.    . Armodafinil 250 MG tablet Take by mouth.    Marland Kitchen BIOTIN PO Take 1 tablet by mouth daily.    . calcium citrate (CALCITRATE - DOSED IN MG ELEMENTAL CALCIUM) 950 MG tablet Take 1 tablet by mouth daily.    . clonazePAM (KLONOPIN) 1 MG tablet TAKE 1 TABLET BY MOUTH EVERY DAY 30 tablet 3  . cyclobenzaprine (FLEXERIL) 10 MG tablet Take 1 tablet (10 mg total) by mouth 3 (three) times daily as needed. 270 tablet 0  . DULoxetine (CYMBALTA) 60 MG capsule Take 2 capsules (120 mg total) by mouth daily. 240 capsule 2  . FEROSUL 325 (65 Fe) MG tablet TAKE 1 TABLET BY MOUTH EVERY DAY WITH BREAKFAST 30 tablet 6  . furosemide (LASIX) 40 MG tablet TAKE 2 AND 1/2 TABLETS BY MOUTH EVERY DAY 225 tablet 0  .  gabapentin (NEURONTIN) 800 MG tablet TAKE 1 TABLET BY MOUTH 3 TIMES DAILY 90 tablet 1  . HYDROcodone-acetaminophen (NORCO) 10-325 MG tablet Take 1 tablet by mouth every 6 (six) hours as needed. 90 tablet 0  . meclizine (ANTIVERT) 25 MG tablet Take 1 tablet (25 mg total) by mouth 3 (three) times daily as needed for dizziness. 30 tablet 0  . nebivolol (BYSTOLIC) 10 MG tablet Take 1 tablet (10 mg total) by mouth daily. 90 tablet 0  . OXcarbazepine (TRILEPTAL) 150 MG tablet Take 150 mg by mouth 2 (two) times daily.  0  . pantoprazole (PROTONIX) 40 MG tablet TAKE 1 TABLET BY MOUTH 2 TIMES DAILY 180 tablet 1  . pentoxifylline (TRENTAL) 400 MG CR tablet TAKE 1 TABLET BY MOUTH 3 TIMES DAILY WITH A MEAL  11  . potassium iodide (SSKI) 1 GM/ML solution Take 25 mg 3 (three) times daily by mouth.    . promethazine (PHENERGAN) 25 MG tablet TAKE 1 TABLET BY MOUTH EVERY 6 HOURS AS NEEDED FOR NAUSEA AND VOMITING 45 tablet 3  . rosuvastatin (CRESTOR) 20 MG tablet TAKE 1 TABLET BY MOUTH EVERY DAY 90 tablet 1   No facility-administered medications prior to visit.     Allergies  Allergen Reactions  . Niacin Other (See Comments)    REACTION: Swelling, problems breathing  . Sulfamethoxazole-Trimethoprim Other (See Comments)    REACTION: Rash, throat closed, eyes swelled.  . Aspirin Hives  . Bactrim Hives  . Benzoin Compound Rash  . Cephalexin Hives  . Clindamycin/Lincomycin Hives  . Doxycycline Hives  . Iohexol Hives    Pt treated with PO benedryl  . Lisinopril Hives  . Naproxen Hives  . Pnu-Imune [Pneumococcal Polysaccharide Vaccine] Rash  . Sulfonamide Derivatives Rash   ROS Pertinent ROS are listed in the HPI    Objective:    Physical Exam  Constitutional: She is oriented to person, place, and time. She appears well-developed and well-nourished.  HENT:  Head: Normocephalic and atraumatic.  Eyes: Conjunctivae are normal.  Neck: Neck supple.  Cardiovascular: Normal rate, regular rhythm, normal  heart sounds and intact distal pulses.  Pulmonary/Chest: Effort normal and breath sounds normal. No respiratory distress. She has no wheezes. She has no rales. She exhibits no tenderness.  Abdominal: Soft. Bowel sounds are normal. She exhibits no distension and no mass. There is no tenderness. There is no rebound  and no guarding.  Large pannus noted.  Negative CVA tenderness  Neurological: She is alert and oriented to person, place, and time.  Vitals reviewed.    BP 118/80   Pulse 95   Temp 98.5 F (36.9 C) (Oral)   Resp 16   Ht 5\' 3"  (1.6 m)   Wt (!) 356 lb (161.5 kg)   SpO2 97%   BMI 63.06 kg/m  Wt Readings from Last 3 Encounters:  04/03/18 (!) 356 lb (161.5 kg)  02/05/18 (!) 369 lb 6 oz (167.5 kg)  08/30/17 (!) 380 lb 9.6 oz (172.6 kg)     Lab Results  Component Value Date   TSH 2.99 07/04/2017   Lab Results  Component Value Date   WBC 9.4 10/04/2017   HGB 12.8 10/04/2017   HCT 40 10/04/2017   MCV 85.1 07/04/2017   PLT 273.0 07/04/2017   Lab Results  Component Value Date   NA 130 (A) 10/04/2017   K 3.6 10/04/2017   CO2 34 (H) 07/04/2017   GLUCOSE 98 07/04/2017   BUN 7 10/04/2017   CREATININE 0.6 10/04/2017   BILITOT 0.4 07/04/2017   ALKPHOS 108 10/04/2017   AST 19 10/04/2017   ALT 9 10/04/2017   PROT 6.5 07/04/2017   ALBUMIN 3.8 07/04/2017   CALCIUM 9.0 07/04/2017   ANIONGAP 6 01/15/2016   GFR 85.19 07/04/2017   Lab Results  Component Value Date   CHOL 114 07/04/2017   Lab Results  Component Value Date   HDL 41.90 07/04/2017   Lab Results  Component Value Date   LDLCALC 35 07/04/2017   Lab Results  Component Value Date   TRIG 186.0 (H) 07/04/2017   Lab Results  Component Value Date   CHOLHDL 3 07/04/2017   Lab Results  Component Value Date   HGBA1C 5.6 10/04/2017       Assessment & Plan:   Problem List Items Addressed This Visit      Other   Urinary urgency - Primary    Urine dip overall unremarkable. Will send for culture.  Suspect OAB over mild cystitis giving negative exam and UA findings. Will wait for culture to make final determination. She has been instructed to limit caffeine and alcohol as this can cause bladder irritation and diuresis. She is on diuretics for comorbid conditions which is also playing a factor. Again will wait for culture results before initiating medication for either infection or OAB. May require referral to Urology.       Relevant Orders   POCT Urinalysis Dipstick (Completed)   Urine Culture       No orders of the defined types were placed in this encounter.    Leeanne Rio, PA-C

## 2018-04-03 NOTE — Patient Instructions (Signed)
-   Please keep well-hydrated and get plenty of rest.  - Limit caffeine consumption.  I am sending urine for culture to further assess for lingering infection. If positive we will start antibiotic. If negative will we start trial medication for overactive bladder.  Keep active to help promote weight loss as this contributes to urinary urgency and frequency as we get older.

## 2018-04-04 ENCOUNTER — Ambulatory Visit: Payer: Medicare Other

## 2018-04-04 ENCOUNTER — Ambulatory Visit: Payer: Medicare Other | Admitting: Family Medicine

## 2018-04-04 DIAGNOSIS — R3915 Urgency of urination: Secondary | ICD-10-CM | POA: Insufficient documentation

## 2018-04-04 NOTE — Telephone Encounter (Signed)
Last OV 04/03/18 Alprazolam last filled 01/31/18 #90 with 1

## 2018-04-04 NOTE — Assessment & Plan Note (Signed)
Urine dip overall unremarkable. Will send for culture. Suspect OAB over mild cystitis giving negative exam and UA findings. Will wait for culture to make final determination. She has been instructed to limit caffeine and alcohol as this can cause bladder irritation and diuresis. She is on diuretics for comorbid conditions which is also playing a factor. Again will wait for culture results before initiating medication for either infection or OAB. May require referral to Urology.

## 2018-04-05 ENCOUNTER — Ambulatory Visit: Payer: Self-pay

## 2018-04-05 ENCOUNTER — Encounter: Payer: Self-pay | Admitting: Physician Assistant

## 2018-04-05 ENCOUNTER — Encounter: Payer: Self-pay | Admitting: Family Medicine

## 2018-04-05 LAB — URINE CULTURE
MICRO NUMBER:: 91394034
SPECIMEN QUALITY:: ADEQUATE

## 2018-04-05 NOTE — Telephone Encounter (Signed)
Please advise 

## 2018-04-05 NOTE — Telephone Encounter (Signed)
Pharmacy called and did not receive this medication for refill yesterday. Please review.

## 2018-04-05 NOTE — Addendum Note (Signed)
Addended by: Fransisca Connors A on: 04/05/2018 04:37 PM   Modules accepted: Orders

## 2018-04-05 NOTE — Telephone Encounter (Signed)
Addressed in another encounter

## 2018-04-06 MED ORDER — ALPRAZOLAM 0.5 MG PO TABS: ORAL_TABLET | ORAL | 1 refills | Status: DC

## 2018-04-06 NOTE — Addendum Note (Signed)
Addended by: Davis Gourd on: 04/06/2018 04:55 PM   Modules accepted: Orders

## 2018-04-09 ENCOUNTER — Other Ambulatory Visit: Payer: Self-pay | Admitting: Family Medicine

## 2018-04-09 MED ORDER — CYCLOBENZAPRINE HCL 10 MG PO TABS: 10.0000 mg | ORAL_TABLET | Freq: Three times a day (TID) | ORAL | 0 refills | Status: DC | PRN

## 2018-04-09 MED ORDER — HYDROCODONE-ACETAMINOPHEN 10-325 MG PO TABS: 1.0000 | ORAL_TABLET | Freq: Four times a day (QID) | ORAL | 0 refills | Status: DC | PRN

## 2018-04-09 NOTE — Telephone Encounter (Signed)
Last OV 04/03/18 Flexeril last filled 12/29/17 #270 with 0 Hydrocodone last filled 03/16/18#90 wiht0

## 2018-04-17 NOTE — Unmapped (Signed)
[facility] Refill Coordination Note    Specialty Medication(s) to be Shipped:   Inflammatory Disorders: Humira    Other medication(s) to be shipped: n/a     Autumn Mcdonald, DOB: 10-06-1970  Phone: [phone] (home)       All above HIPAA information was verified with patient.     Completed refill call assessment today to schedule patient's medication shipment from the [facility] [phone]).       Specialty medication(s) and dose(s) confirmed: Regimen is correct and unchanged.   Changes to medications: [name] reports no changes reported at this time.  Changes to insurance: No  Questions for the pharmacist: No    The patient will receive a drug information handout for each medication shipped and additional FDA Medication Guides as required.      DISEASE/MEDICATION-SPECIFIC INFORMATION        For patients on injectable medications: Patient currently has 2 doses left.  Next injection is scheduled for thursday.    SPECIALTY MEDICATION ADHERENCE     Medication Adherence    Patient reported X missed doses in the last month:  0  Specialty Medication:  humira  Patient is on additional specialty medications:  No  Patient is on more than two specialty medications:  No  Any gaps in refill history greater than 2 weeks in the last 3 months:  no  Demonstrates understanding of importance of adherence:  yes  Informant:  patient  Reliability of informant:  reliable              Confirmed plan for next specialty medication refill:  delivery by pharmacy  Refills needed for supportive medications:  not needed          Refill Coordination    Has the Patients' Contact Information Changed:  No  Is the Shipping Address Different:  No           humira 40mg /0.37ml injection: patient has 2 pens on hand [14 day supply]      SHIPPING     Shipping address confirmed in Epic.     Delivery Scheduled: Yes, Expected medication delivery date: 12/10.     Medication will be delivered via UPS to the home address in Epic WAM.    Renette Butters   [facility] Specialty Technician

## 2018-04-23 MED FILL — HUMIRA PEN 40 MG/0.8 ML SUBCUTANEOUS KIT: 28 days supply | Qty: 4 | Fill #4 | Status: AC

## 2018-04-23 MED FILL — HUMIRA PEN 40 MG/0.8 ML SUBCUTANEOUS KIT: SUBCUTANEOUS | 28 days supply | Qty: 4 | Fill #4

## 2018-04-24 ENCOUNTER — Ambulatory Visit: Payer: Medicare Other | Admitting: Family Medicine

## 2018-04-25 ENCOUNTER — Ambulatory Visit: Payer: Medicare Other | Admitting: Family Medicine

## 2018-04-25 ENCOUNTER — Encounter

## 2018-04-26 ENCOUNTER — Ambulatory Visit: Payer: Medicare Other

## 2018-04-27 ENCOUNTER — Other Ambulatory Visit: Payer: Self-pay

## 2018-04-27 ENCOUNTER — Encounter: Payer: Self-pay | Admitting: Family Medicine

## 2018-04-27 ENCOUNTER — Ambulatory Visit (INDEPENDENT_AMBULATORY_CARE_PROVIDER_SITE_OTHER): Payer: Medicare Other | Admitting: Family Medicine

## 2018-04-27 VITALS — BP 120/81 | HR 86 | Temp 98.2°F | Resp 98 | Ht 63.0 in | Wt 341.0 lb

## 2018-04-27 DIAGNOSIS — R300 Dysuria: Secondary | ICD-10-CM

## 2018-04-27 LAB — POCT URINALYSIS DIPSTICK
Bilirubin, UA: NEGATIVE
Glucose, UA: NEGATIVE
Ketones, UA: NEGATIVE
Leukocytes, UA: NEGATIVE
NITRITE UA: NEGATIVE
PH UA: 6 (ref 5.0–8.0)
PROTEIN UA: POSITIVE — AB
RBC UA: NEGATIVE
Spec Grav, UA: 1.025 (ref 1.010–1.025)
UROBILINOGEN UA: 1 U/dL

## 2018-04-27 MED ORDER — CIPROFLOXACIN HCL 500 MG PO TABS: 500.0000 mg | ORAL_TABLET | Freq: Two times a day (BID) | ORAL | 0 refills | Status: DC

## 2018-04-27 NOTE — Progress Notes (Signed)
   Subjective:    Patient ID: [name], female    DOB: 10-31-1970, 47 y.o.   MRN: [id number]  HPI Urinary frequency- sxs started 11/18 w/ frequency and urgency.  At some points she was unable to make it to the bathroom.  Had some back pain.  No longer having back pain but now having painful urination- 'like I need to go and I can't'.  + 'funky smell'.   Review of Systems For ROS see HPI     Objective:   Physical Exam Vitals signs reviewed.  Constitutional:      General: She is not in acute distress.    Appearance: She is well-developed.     Comments: Morbidly obese  Abdominal:     General: There is no distension.     Palpations: Abdomen is soft.     Tenderness: There is no abdominal tenderness (no suprapubic or CVA tenderness).  Skin:    General: Skin is warm and dry.  Psychiatric:        Mood and Affect: Mood normal.        Behavior: Behavior normal.           Assessment & Plan:  Dysuria- ongoing issue for pt.  UA and cx done last month were negative but pt continues to have sxs.  Start Cipro while awaiting culture.  If culture is negative and sxs persist, she will need urology appt.  Pt expressed understanding and is in agreement w/ plan.

## 2018-04-27 NOTE — Addendum Note (Signed)
Addended by: Doran Clay A on: 04/27/2018 11:25 AM   Modules accepted: Orders

## 2018-04-27 NOTE — Patient Instructions (Signed)
Follow up as needed or as scheduled START the Cipro twice daily- take w/ food Drink plenty of fluids Call with any questions or concerns Happy Holidays!!

## 2018-04-29 LAB — URINE CULTURE
MICRO NUMBER: 91495650
SPECIMEN QUALITY:: ADEQUATE

## 2018-05-03 ENCOUNTER — Other Ambulatory Visit: Payer: Self-pay | Admitting: Family Medicine

## 2018-05-03 ENCOUNTER — Encounter: Payer: Self-pay | Admitting: Family Medicine

## 2018-05-03 DIAGNOSIS — R102 Pelvic and perineal pain: Secondary | ICD-10-CM

## 2018-05-03 MED ORDER — FLUCONAZOLE 150 MG PO TABS: 150.0000 mg | ORAL_TABLET | Freq: Once | ORAL | 0 refills | Status: AC

## 2018-05-03 NOTE — Telephone Encounter (Signed)
Last OV 04/27/18 Hydrocodone last filled 04/09/18 #90 with 0

## 2018-05-04 ENCOUNTER — Encounter: Payer: Self-pay | Admitting: Family Medicine

## 2018-05-04 ENCOUNTER — Other Ambulatory Visit: Payer: Self-pay | Admitting: Family Medicine

## 2018-05-04 MED ORDER — HYDROCODONE-ACETAMINOPHEN 10-325 MG PO TABS: 1.0000 | ORAL_TABLET | Freq: Four times a day (QID) | ORAL | 0 refills | Status: DC | PRN

## 2018-05-04 NOTE — Telephone Encounter (Signed)
Last OV 04/27/18 Hydrocodone last filled 04/09/18 #90 with 0

## 2018-05-04 NOTE — Telephone Encounter (Signed)
Last OV: 04/18/2018 Last Fill: 04/09/2018, #90 with 0 Rf

## 2018-05-07 ENCOUNTER — Other Ambulatory Visit: Payer: Self-pay | Admitting: Family Medicine

## 2018-05-07 NOTE — Telephone Encounter (Signed)
Last OV 04/27/18 Alprazolam last filled 04/06/18 #90 with 0

## 2018-05-11 ENCOUNTER — Encounter: Payer: Self-pay | Admitting: Family Medicine

## 2018-05-11 ENCOUNTER — Other Ambulatory Visit: Payer: Self-pay | Admitting: Family Medicine

## 2018-05-11 ENCOUNTER — Ambulatory Visit (INDEPENDENT_AMBULATORY_CARE_PROVIDER_SITE_OTHER): Payer: Medicare Other | Admitting: Family Medicine

## 2018-05-11 VITALS — BP 130/76 | HR 68 | Temp 98.4°F | Resp 16 | Ht 63.0 in | Wt 345.0 lb

## 2018-05-11 DIAGNOSIS — R102 Pelvic and perineal pain: Secondary | ICD-10-CM | POA: Diagnosis not present

## 2018-05-11 DIAGNOSIS — I10 Essential (primary) hypertension: Secondary | ICD-10-CM | POA: Diagnosis not present

## 2018-05-11 DIAGNOSIS — E785 Hyperlipidemia, unspecified: Secondary | ICD-10-CM

## 2018-05-11 LAB — CBC WITH DIFFERENTIAL/PLATELET
Basophils Absolute: 0.1 10*3/uL (ref 0.0–0.1)
Basophils Relative: 0.9 % (ref 0.0–3.0)
Eosinophils Absolute: 0.5 10*3/uL (ref 0.0–0.7)
Eosinophils Relative: 7.8 % — ABNORMAL HIGH (ref 0.0–5.0)
HCT: 38.4 % (ref 36.0–46.0)
Hemoglobin: 12.6 g/dL (ref 12.0–15.0)
Lymphocytes Relative: 27.1 % (ref 12.0–46.0)
Lymphs Abs: 1.9 10*3/uL (ref 0.7–4.0)
MCHC: 32.7 g/dL (ref 30.0–36.0)
MCV: 87.5 fl (ref 78.0–100.0)
MONO ABS: 0.7 10*3/uL (ref 0.1–1.0)
Monocytes Relative: 9.6 % (ref 3.0–12.0)
Neutro Abs: 3.8 10*3/uL (ref 1.4–7.7)
Neutrophils Relative %: 54.6 % (ref 43.0–77.0)
Platelets: 378 10*3/uL (ref 150.0–400.0)
RBC: 4.39 Mil/uL (ref 3.87–5.11)
RDW: 15.3 % (ref 11.5–15.5)
WBC: 7 10*3/uL (ref 4.0–10.5)

## 2018-05-11 LAB — LIPID PANEL
Cholesterol: 108 mg/dL (ref 0–200)
HDL: 41.8 mg/dL (ref >39.00)
LDL Cholesterol: 48 mg/dL (ref 0–99)
NonHDL: 66.23
Total CHOL/HDL Ratio: 3
Triglycerides: 91 mg/dL (ref 0.0–149.0)
VLDL: 18.2 mg/dL (ref 0.0–40.0)

## 2018-05-11 LAB — BASIC METABOLIC PANEL
BUN: 8 mg/dL (ref 6–23)
CALCIUM: 9 mg/dL (ref 8.4–10.5)
CO2: 29 mEq/L (ref 19–32)
Chloride: 100 mEq/L (ref 96–112)
Creatinine, Ser: 0.56 mg/dL (ref 0.40–1.20)
GFR: 122.58 mL/min (ref >60.00)
Glucose, Bld: 78 mg/dL (ref 70–99)
Potassium: 4.5 mEq/L (ref 3.5–5.1)
Sodium: 135 mEq/L (ref 135–145)

## 2018-05-11 LAB — HEPATIC FUNCTION PANEL
ALT: 14 U/L (ref 0–35)
AST: 18 U/L (ref 0–37)
Albumin: 3.9 g/dL (ref 3.5–5.2)
Alkaline Phosphatase: 144 U/L — ABNORMAL HIGH (ref 39–117)
Bilirubin, Direct: 0.1 mg/dL (ref 0.0–0.3)
Total Bilirubin: 0.3 mg/dL (ref 0.2–1.2)
Total Protein: 6.8 g/dL (ref 6.0–8.3)

## 2018-05-11 LAB — TSH: TSH: 0.94 u[IU]/mL (ref 0.35–4.50)

## 2018-05-11 MED ORDER — CIPROFLOXACIN HCL 500 MG PO TABS: 500.0000 mg | ORAL_TABLET | Freq: Two times a day (BID) | ORAL | 0 refills | Status: DC

## 2018-05-11 MED ORDER — FLUCONAZOLE 150 MG PO TABS: 150.0000 mg | ORAL_TABLET | Freq: Once | ORAL | 0 refills | Status: AC

## 2018-05-11 MED ORDER — PENTOXIFYLLINE ER 400 MG TABLET,EXTENDED RELEASE
ORAL_TABLET | 2 refills | 0.00000 days | Status: CP
Start: 2018-05-11 — End: ?

## 2018-05-11 NOTE — Assessment & Plan Note (Signed)
Chronic problem.  Tolerating statin w/o difficulty.  Stressed need for healthy diet and regular exercise.  Check labs.  Adjust meds prn  

## 2018-05-11 NOTE — Progress Notes (Signed)
   Subjective:    Patient ID: [name], female    DOB: [birth year], 47 y.o.   MRN: [id number]  HPI HTN- chronic problem, on Bystolic 10mg  w/ good control.  No CP, SOB, HAs, visual changes.  + edema of feet and legs due to vasculitis.  Hyperlipidemia- chronic problem, on Crestor 20mg  daily.  No abd pain, N/V.  Obesity- pt has again gained weight.  BMI now 61.1.  Pt reports some walking.  Attempting to 'eat better'- not following a particular diet.  Pt reports ongoing suprapubic pain radiating around R flank and into back.  Pain improved w/ 3 days of Cipro but returned once medication was done.   Review of Systems For ROS see HPI     Objective:   Physical Exam General Appearance:    Alert, cooperative, no distress, appears stated age, obese  Head:    Normocephalic, without obvious abnormality, atraumatic  Eyes:    PERRL, conjunctiva/corneas clear, EOM's intact, fundi    benign, both eyes  Ears:    Normal TM's and external ear canals, both ears  Nose:   Nares normal, septum midline, mucosa normal, no drainage    or sinus tenderness  Throat:   Lips, mucosa, and tongue normal; missing multiple teeth  Neck:   Supple, symmetrical, trachea midline, no adenopathy;    Thyroid: no enlargement/tenderness/nodules  Back:     Symmetric, no curvature, ROM normal, no CVA tenderness  Lungs:     Clear to auscultation bilaterally, respirations unlabored  Chest Wall:    No tenderness or deformity   Heart:    Regular rate and rhythm, S1 and S2 normal, no murmur, rub   or gallop  Breast Exam:    Deferred to GYN  Abdomen:     Soft, non-tender, bowel sounds active all four quadrants,    no masses, no organomegaly  Genitalia:    Deferred to GYN  Rectal:    Extremities:   Redness and swelling of LEs bilaterally  Pulses:   2+ and symmetric all extremities  Skin:   Skin color, texture, turgor normal, no rashes or lesions  Lymph nodes:   Cervical, supraclavicular, and axillary nodes normal    Neurologic:   CNII-XII intact, normal strength, sensation and reflexes    throughout          Assessment & Plan:  Suprapubic pain- pt reports feeling better after her Cipro course but sxs returned shortly after her 3 day course.  Start Cipro twice daily x5 days while waiting on GYN appt.  Pt expressed understanding and is in agreement w/ plan.

## 2018-05-11 NOTE — Assessment & Plan Note (Signed)
Deteriorated.  Pt continues to gain weight despite her hx of gastric bypass surgery.  Stressed need for healthy diet and regular exercise.  Check labs to risk stratify.  Will follow.

## 2018-05-11 NOTE — Assessment & Plan Note (Signed)
Chronic problem.  Adequate control.  Currently asymptomatic.  Check labs.  No anticipated med changes.  Will follow. 

## 2018-05-11 NOTE — Patient Instructions (Signed)
Schedule your Wellness Visit w/ Kim at your convenience Follow up with me in 6 months to recheck BP, cholesterol, and weight loss progress We'll notify you of your lab results and make any changes if needed Continue to work on healthy diet and regular exercise- you can do it! START the Cipro twice daily- take w/ food USE the Diflucan as needed if you develop yeast Call with any questions or concerns Happy New Year!!!

## 2018-05-14 ENCOUNTER — Other Ambulatory Visit (INDEPENDENT_AMBULATORY_CARE_PROVIDER_SITE_OTHER): Payer: Medicare Other

## 2018-05-14 DIAGNOSIS — R748 Abnormal levels of other serum enzymes: Secondary | ICD-10-CM

## 2018-05-14 LAB — GAMMA GT: GGT: 127 U/L — ABNORMAL HIGH (ref 7–51)

## 2018-05-14 NOTE — Unmapped (Signed)
Patient not expecting ins changes with jan 2020    [facility] Refill Coordination Note    Specialty Medication(s) to be Shipped:   Inflammatory Disorders: Humira    Other medication(s) to be shipped: n/a     Autumn Mcdonald, DOB: Oct 01, 1970  Phone: [phone] (home)       All above HIPAA information was verified with patient.     Completed refill call assessment today to schedule patient's medication shipment from the [facility] [phone]).       Specialty medication(s) and dose(s) confirmed: Regimen is correct and unchanged.   Changes to medications: Autumn Mcdonald reports no changes reported at this time.  Changes to insurance: No  Questions for the pharmacist: No    The patient will receive a drug information handout for each medication shipped and additional FDA Medication Guides as required.      DISEASE/MEDICATION-SPECIFIC INFORMATION        For patients on injectable medications: Patient currently has 2 doses left.  Next injection is scheduled for 1/2 thursday.    SPECIALTY MEDICATION ADHERENCE     Medication Adherence    Patient reported X missed doses in the last month:  0  Specialty Medication:  humira  Patient is on additional specialty medications:  No  Patient is on more than two specialty medications:  No  Any gaps in refill history greater than 2 weeks in the last 3 months:  no  Demonstrates understanding of importance of adherence:  yes  Informant:  patient  Reliability of informant:  reliable              Confirmed plan for next specialty medication refill:  delivery by pharmacy  Refills needed for supportive medications:  not needed          Refill Coordination    Has the Patients' Contact Information Changed:  No  Is the Shipping Address Different:  No           humira 40mg /0.3ml injection pen: patient has 14 days of medication on hand      SHIPPING     Shipping address confirmed in Epic.     Delivery Scheduled: Yes, Expected medication delivery date: 05/22/2018. Medication will be delivered via UPS to the home address in Epic WAM.    Renette Butters   [facility] Specialty Technician

## 2018-05-15 ENCOUNTER — Telehealth: Payer: Self-pay | Admitting: General Practice

## 2018-05-15 ENCOUNTER — Ambulatory Visit (INDEPENDENT_AMBULATORY_CARE_PROVIDER_SITE_OTHER): Payer: Medicare Other | Admitting: Gynecology

## 2018-05-15 ENCOUNTER — Encounter: Payer: Self-pay | Admitting: Gynecology

## 2018-05-15 ENCOUNTER — Other Ambulatory Visit: Payer: Self-pay | Admitting: Family Medicine

## 2018-05-15 VITALS — BP 134/84 | Ht 62.0 in | Wt 345.0 lb

## 2018-05-15 DIAGNOSIS — Z01419 Encounter for gynecological examination (general) (routine) without abnormal findings: Secondary | ICD-10-CM

## 2018-05-15 DIAGNOSIS — R87620 Atypical squamous cells of undetermined significance on cytologic smear of vagina (ASC-US): Secondary | ICD-10-CM | POA: Diagnosis not present

## 2018-05-15 DIAGNOSIS — Z9289 Personal history of other medical treatment: Secondary | ICD-10-CM

## 2018-05-15 DIAGNOSIS — Z1272 Encounter for screening for malignant neoplasm of vagina: Secondary | ICD-10-CM

## 2018-05-15 DIAGNOSIS — R1031 Right lower quadrant pain: Secondary | ICD-10-CM

## 2018-05-15 DIAGNOSIS — R748 Abnormal levels of other serum enzymes: Secondary | ICD-10-CM

## 2018-05-15 NOTE — Telephone Encounter (Signed)
Called and spoke with Larene Beach. Gave her the Dx codes for patients Alprazolam, Clonazepam Hydrocodone, Gabapentin.

## 2018-05-15 NOTE — Telephone Encounter (Signed)
Copied from [place] [phone]. Topic: General - Other >> May 14, 2018  1:39 PM Yvette Rack wrote: Reason for CRM: Larene Beach from  [facility] [phone] due to  pt insurance would like a call back about pt medicines and DX codes

## 2018-05-15 NOTE — Addendum Note (Signed)
Addended by: Nelva Nay on: 05/15/2018 10:42 AM   Modules accepted: Orders
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
## 2018-05-15 NOTE — Patient Instructions (Signed)
Follow-up for the ultrasound as scheduled. 

## 2018-05-16 DIAGNOSIS — R87610 Atypical squamous cells of undetermined significance on cytologic smear of cervix (ASC-US): Secondary | ICD-10-CM

## 2018-05-16 HISTORY — DX: Atypical squamous cells of undetermined significance on cytologic smear of cervix (ASC-US): R87.610

## 2018-05-17 ENCOUNTER — Encounter: Admit: 2018-05-17 | Discharge: 2018-05-18 | Payer: MEDICARE

## 2018-05-17 ENCOUNTER — Ambulatory Visit: Payer: Medicare Other

## 2018-05-17 DIAGNOSIS — I890 Lymphedema, not elsewhere classified: Secondary | ICD-10-CM

## 2018-05-17 DIAGNOSIS — Z79899 Other long term (current) drug therapy: Secondary | ICD-10-CM

## 2018-05-17 DIAGNOSIS — I872 Venous insufficiency (chronic) (peripheral): Secondary | ICD-10-CM

## 2018-05-17 DIAGNOSIS — M300 Polyarteritis nodosa: Principal | ICD-10-CM

## 2018-05-17 DIAGNOSIS — L409 Psoriasis, unspecified: Secondary | ICD-10-CM

## 2018-05-17 DIAGNOSIS — L52 Erythema nodosum: Secondary | ICD-10-CM

## 2018-05-17 MED ORDER — COMPRESSION STOCKING,KNEE HIGH,LONG LENGTH,X-LARGE CIRC
Freq: Every day | 0 refills | 0.00000 days | Status: CP
Start: 2018-05-17 — End: 2018-05-17

## 2018-05-17 MED ORDER — COMPRESSION STOCKING,KNEE HIGH,LONG LENGTH,X-LARGE CIRC: 1 | each | Freq: Every day | 0 refills | 0 days | Status: AC

## 2018-05-17 NOTE — Unmapped (Addendum)
Apply compression stockings or ACE bandage in the morning immediately after waking. You can also consider finding compression wear that has Velcro for intermittent adjustment.

## 2018-05-17 NOTE — Unmapped (Signed)
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 Eyvonne Left favoring nodular vasculitis/erythema induratum.  - While there are not venous changes on duplex, Dr. Beverlee Nims prior review of the article Does severe venous insufficiency have a different etiology in the morbidly obese?  Is it venous? by Telford Nab et al, suggests that her morbid obesity contributes to impedence of both venous return and lymph return resulting in lymphovenous hypertension and thus changes of venous stasis and lymphedema seen on clinically on her exam.  - Continue on Humira 40mg  SQ weekly.  - Continue potassium iodide (SSKI) 1 gram/mL solution 7 drops TID in OJ.  - Continue pentoxifylline 400mg  TID  - Continue horse chestnut extract 250mg  BID  - Continue Gabapentin 800 mg BID-TID.  - Continue to encourage compression if possible. New printed script provided today. Discussed alternatives to compression stockings and ACE bandages including compression wear that uses Velcro and can be adjusted during the day. Discussed wearing compression stockings immediately after waking when legs are least swollen. Unfortunately, insurance will not cover lymphedema pump, lymphedema massage, or visits to our lymphedema clinic. She has not yet but will try lymphedema massage out of pocket near her home (~$60).    High risk medication use (Humira)  - Check Quant gold today; last checked 03/2017    RTC: Return in about 3 months (around 08/16/2018) for Chronic venous stasis dermatitis/lymphedema.   ____________________________________________    CC:   Chief Complaint   Patient presents with   ??? Follow-up     Cellulitis has gotten worse       HPI:  Autumn Mcdonald is a 48 y.o. female who was last seen by Dr. Charlsie Merles on 12/14/2017, and was seen today by Lennon Alstrom, MD for f/u of cutaneous PAN vs erythema induratum and chronic venous stasis dermatitis/lymphedema. At [facility], previously followed cellulitis of RLE had resolved s/p Azithromycin and Moxifloxacin.    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 Rheumatology was able to taper her off Prednisone at end of August 2019. We previouslystrongly recommended compression, which she found intolerable due to too tight. She had a similar issue with ACE bandage too. Today reports the swelling and discomfort in legs has worsened acutely in last few days. She has pain and pins/needles sensation especially at night. She takes Gabapentin 800 mg BID (increased recently by PCP) for this. Denies any new nodules or plaques. She denies any recent illnesses or changes to her health.    No other skin concerns today.    Prior history:  - Course: failed MTX (11/2014), plaquenil (8/16), immuran (3/17), imuran (5/17), dapsone; added Humira 06/2017.  - Biopsies: small vessel vasculitis suggestive for PAN in 2016; venous stasis disease 08/2016; deep arteritis with some ??  - Cellulitis of the R lower extremities in setting of chronic venous stasis dermatitis and chronic leg edema. Cultures with 3+ pasturella and  3+ MSSA. Resolved s/p azithromycin, moxifloxacin 400mg .    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 The pattern of vasculitis has some similarities to nodular vasculitis, although there is very little panniculitis in these two specimens.  Discussed with Dr. Eyvonne Left and he favors NV/EI.  ??  Biopsy x 2 on BLE 2016--medium vessel vasculitis c/w PAN    PMH:  Morbid obesity  HTN  Depression  Fibromyalgia  Psoriasis: plaque type (BSA around 5%)    FH/SH: reviewed in Epic      ROS:  Negative for fever, chills, or recent illness. No other skin complaints except as noted in HPI. All other systems reviewed are negative.      Physical Exam:  General: Well-developed, well-nourished female in no acute distress, resting comfortably.  Neuro: A, A, Ox 3. Answers questions appropriately.  Skin: Per patient request, focused examination of the bilateral lower extremities examined and pertinent for:  - Pitting and non pitting edema of BLE.   - Diffuse erythema of the B/L lower legs, primarily over anterior shins, with R leg more brightly erythematous compared to L leg, and with induration of the skin. No nodules or plaques seen clinically or palpated.   - No open sores or wounds on BLE, feet, or interdigital toes.  - All other areas examined were normal or had no significant findings.

## 2018-05-18 LAB — PAP IG W/ RFLX HPV ASCU

## 2018-05-18 LAB — HUMAN PAPILLOMAVIRUS, HIGH RISK: HPV DNA High Risk: NOT DETECTED

## 2018-05-21 ENCOUNTER — Encounter: Payer: Self-pay | Admitting: Gynecology

## 2018-05-21 MED FILL — HUMIRA PEN 40 MG/0.8 ML SUBCUTANEOUS KIT: 28 days supply | Qty: 4 | Fill #5 | Status: AC

## 2018-05-21 MED FILL — HUMIRA PEN 40 MG/0.8 ML SUBCUTANEOUS KIT: SUBCUTANEOUS | 28 days supply | Qty: 4 | Fill #5

## 2018-05-21 NOTE — Telephone Encounter (Signed)
I would prefer to stay out of that and have the patient completely follow-up with the other providers.  I do not want to miss something that there interested her wanting to look at because I do not understand the full picture of what is going on.  She is better to stay with who she is seeing to address that problem.  She is

## 2018-05-22 LAB — QUANTIFERON TB GOLD PLUS
Lab: NEGATIVE
QUANTIFERON ANTIGEN 2 MINUS NIL: -0.17 [IU]/mL
QUANTIFERON MITOGEN: >10 [IU]/mL
QUANTIFERON TB NIL VALUE: 0.23 [IU]/mL

## 2018-05-23 NOTE — Unmapped (Signed)
Reviewed results of Quant gold -- negative. Patient informed of results through MyChart.      Theora Gianotti, MD  PGY-2 Resident Physician  [facility] Department of Dermatology

## 2018-05-24 ENCOUNTER — Other Ambulatory Visit: Payer: Medicare Other

## 2018-05-24 ENCOUNTER — Ambulatory Visit: Payer: Medicare Other | Admitting: Gynecology

## 2018-05-29 ENCOUNTER — Other Ambulatory Visit: Payer: Self-pay | Admitting: Family Medicine

## 2018-05-29 MED ORDER — HYDROCODONE-ACETAMINOPHEN 10-325 MG PO TABS: 1.0000 | ORAL_TABLET | Freq: Four times a day (QID) | ORAL | 0 refills | Status: DC | PRN

## 2018-05-29 NOTE — Telephone Encounter (Signed)
Last OV 05/11/18 Hydrocodone last filled 05/04/18 #90 with 0

## 2018-05-31 ENCOUNTER — Ambulatory Visit: Payer: Medicare Other

## 2018-06-04 ENCOUNTER — Telehealth: Payer: Self-pay | Admitting: *Deleted

## 2018-06-04 ENCOUNTER — Other Ambulatory Visit: Payer: Self-pay | Admitting: Physician Assistant

## 2018-06-04 NOTE — Telephone Encounter (Signed)
Called to speak with Pharmacist, Juliann Pulse at [facility].  She states that patient is currently on Alprazolam, Norco and is now trying to refill a RX for Clonazepam.   She states that with these 3 medications, patient is at great risk for Overdose, and she would like PCP to approve refill.    I reviewed chart with PA, Elyn Aquas, and it appears that PCP has had patient on all 3 medications listed above.  Clonazepam was discontinued at an office visit on December 27th, but the last RX had refills on it.     He states that patient has been using medications as prescribed, however if pharmacy was worried about it then Dr. Birdie Riddle could advise when she returns to the office.     I discussed all of the above notes with Pharmacist and she asked that we send the note to Dr. Birdie Riddle for approval. She states at this time she is not comfortable refilling until she hears from patient's PCP because the 3 combined are such a high risk.      Advised that Dr. Birdie Riddle will return to the office on Wednesday the 22nd, and that we will have her advise what needs to be done.   Copied from [place] [id number]. Topic: General - Other >> Jun 04, 2018 12:10 PM Leward Quan A wrote: Reason for CRM: Juliann Pulse with [facility] called to report a drug interaction between Clonazepam and an Opoid that the patient is taking asking should she go ahead and fill the Rx for the Clonazepam. Please advise Ph# [phone]

## 2018-06-06 ENCOUNTER — Ambulatory Visit: Payer: Medicare Other

## 2018-06-06 NOTE — Telephone Encounter (Signed)
Called and left a detailed message on VM for the pharmacy to advise of PCP recommendations.

## 2018-06-06 NOTE — Telephone Encounter (Signed)
Pt has been on this regimen for years and while we have tried to wean and adjust, nothing else has been effective.  I am aware of the risk- and more importantly, so is the patient.  Please fill medication at this time

## 2018-06-07 ENCOUNTER — Encounter: Payer: Self-pay | Admitting: Family Medicine

## 2018-06-08 NOTE — Unmapped (Signed)
[facility] Refill Coordination Note    Specialty Medication(s) to be Shipped:   Inflammatory Disorders: Humira    Other medication(s) to be shipped: n/a     Autumn Mcdonald, DOB: November 21, 1970  Phone: [phone] (home)       All above HIPAA information was verified with patient.     Completed refill call assessment today to schedule patient's medication shipment from the [facility] [phone]).       Specialty medication(s) and dose(s) confirmed: Regimen is correct and unchanged.   Changes to medications: Autumn Mcdonald reports no changes reported at this time.  Changes to insurance: No  Questions for the pharmacist: No    Confirmed patient received Welcome Packet with first shipment. The patient will receive a drug information handout for each medication shipped and additional FDA Medication Guides as required.       DISEASE/MEDICATION-SPECIFIC INFORMATION        For patients on injectable medications: Patient currently has 4 doses left.  Next injection is scheduled for thursday (next week).    SPECIALTY MEDICATION ADHERENCE     Medication Adherence    Patient reported X missed doses in the last month:  0  Specialty Medication:  humira  Patient is on additional specialty medications:  No  Patient is on more than two specialty medications:  No  Any gaps in refill history greater than 2 weeks in the last 3 months:  no  Demonstrates understanding of importance of adherence:  yes  Informant:  patient              Confirmed plan for next specialty medication refill:  delivery by pharmacy  Refills needed for supportive medications:  not needed          Refill Coordination    Has the Patients' Contact Information Changed:  No  Is the Shipping Address Different:  No           humira pen 40mg /0.50ml injection: patient has 28 days of medication on hand [she has not missed any doses per pt]      SHIPPING     Shipping address confirmed in Epic.     Delivery Scheduled: Yes, Expected medication delivery date: 2/5.     Medication will be delivered via UPS to the home address in Epic WAM.    Autumn Mcdonald   [facility] Specialty Technician

## 2018-06-11 ENCOUNTER — Other Ambulatory Visit: Payer: Medicare Other

## 2018-06-11 ENCOUNTER — Ambulatory Visit: Payer: Medicare Other | Admitting: Gynecology

## 2018-06-13 ENCOUNTER — Encounter: Payer: Self-pay | Admitting: Physician Assistant

## 2018-06-13 ENCOUNTER — Ambulatory Visit (INDEPENDENT_AMBULATORY_CARE_PROVIDER_SITE_OTHER): Payer: Medicare Other

## 2018-06-13 ENCOUNTER — Ambulatory Visit (INDEPENDENT_AMBULATORY_CARE_PROVIDER_SITE_OTHER): Payer: Medicare Other | Admitting: Physician Assistant

## 2018-06-13 ENCOUNTER — Other Ambulatory Visit: Payer: Self-pay

## 2018-06-13 VITALS — BP 140/78 | HR 72 | Temp 98.0°F | Resp 18 | Ht 62.0 in | Wt 344.4 lb

## 2018-06-13 VITALS — BP 140/78 | HR 72 | Temp 98.0°F | Resp 18 | Ht 63.0 in | Wt 344.0 lb

## 2018-06-13 DIAGNOSIS — I890 Lymphedema, not elsewhere classified: Secondary | ICD-10-CM | POA: Diagnosis not present

## 2018-06-13 DIAGNOSIS — M300 Polyarteritis nodosa: Secondary | ICD-10-CM

## 2018-06-13 DIAGNOSIS — I878 Other specified disorders of veins: Secondary | ICD-10-CM | POA: Diagnosis not present

## 2018-06-13 DIAGNOSIS — Z1231 Encounter for screening mammogram for malignant neoplasm of breast: Secondary | ICD-10-CM | POA: Diagnosis not present

## 2018-06-13 DIAGNOSIS — Z6841 Body Mass Index (BMI) 40.0 and over, adult: Secondary | ICD-10-CM | POA: Diagnosis not present

## 2018-06-13 DIAGNOSIS — Z0000 Encounter for general adult medical examination without abnormal findings: Secondary | ICD-10-CM | POA: Diagnosis not present

## 2018-06-13 NOTE — Progress Notes (Addendum)
Subjective:   ERIAN LARIVIERE is a 48 y.o. female who presents for Medicare Annual (Subsequent) preventive examination.  Review of Systems:  No ROS.  Medicare Wellness Visit. Additional risk factors are reflected in the social history.  Cardiac Risk Factors include: dyslipidemia;hypertension;obesity (BMI >30kg/m2);sedentary lifestyle;family history of premature cardiovascular disease   Sleep patterns: Sleeps well.  Home Safety/Smoke Alarms: Feels safe in home. Smoke alarms in place.  Living environment; residence and Firearm Safety: Lives with parents and child in 2 story home.  Seat Belt Safety/Bike Helmet: Wears seat belt.   Female:   Pap-05/15/2018       Mammo-06/05/2017, BI-RADS CATEGORY  2: Benign. Ordered today.  Dexa scan-06/05/2017, normal.         CCS-N/A     Objective:     Vitals: BP 140/78 (BP Location: Left Arm, Patient Position: Sitting, Cuff Size: Normal)   Pulse 72   Temp 98 F (36.7 C) (Temporal)   Resp 18   Ht 5\' 2"  (1.575 m)   Wt (!) 344 lb 6 oz (156.2 kg)   SpO2 98%   BMI 62.99 kg/m   Body mass index is 62.99 kg/m.  Advanced Directives 06/13/2018 01/05/2017 12/21/2016 01/14/2016 01/14/2016  Does Patient Have a Medical Advance Directive? No No No No No  Would patient like information on creating a medical advance directive? Yes (MAU/Ambulatory/Procedural Areas - Information given) Yes (Inpatient - patient defers creating a medical advance directive at this time) Yes (MAU/Ambulatory/Procedural Areas - Information given) No - patient declined information -    Tobacco Social History   Tobacco Use  Smoking Status Never Smoker  Smokeless Tobacco Never Used     Counseling given: Not Answered    Past Medical History:  Diagnosis Date  . Allergic rhinitis   . Ankle fracture, right   . Anxiety   . ASCUS of cervix with negative high risk HPV 05/2018  . Carpal tunnel syndrome   . Cervical cancer ([place]) 1998  . Fibromyalgia   . Hyperlipidemia   .  Hypertension   . Migraine headache   . Obesity   . Sleep apnea    Past Surgical History:  Procedure Laterality Date  . ABDOMINAL HYSTERECTOMY  10/1996  . ABDOMINAL SURGERY  jan/11/2012   gastric bypass surgery  . CARPAL TUNNEL RELEASE     bilateral   . CESAREAN SECTION    . CHOLECYSTECTOMY  07/1992  . ESOPHAGOGASTRODUODENOSCOPY N/A 01/14/2016   Procedure: ESOPHAGOGASTRODUODENOSCOPY (EGD);  Surgeon: Mauri Pole, MD;  Location: [facility] ENDOSCOPY;  Service: Endoscopy;  Laterality: N/A;   Family History  Problem Relation Age of Onset  . Diabetes Mother   . Emphysema Mother   . Allergies Mother   . Hypertension Mother   . Heart disease Father   . Allergies Father   . Prostate cancer Father   . Breast cancer Maternal Grandmother 70   Social History   Socioeconomic History  . Marital status: Single    Spouse name: Not on file  . Number of children: Not on file  . Years of education: Not on file  . Highest education level: Not on file  Occupational History  . 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 Financial resource strain: Not on file  . Food insecurity:    Worry: Not on file    Inability: Not on file  . Transportation needs:    Medical: Not on file    Non-medical: Not on file  Tobacco  Use  . Smoking status: Never Smoker  . Smokeless tobacco: Never Used  Substance and Sexual Activity  . Alcohol use: No  . Drug use: No  . Sexual activity: Yes    Comment: TAH-1st intercourse 52 yo-5 partners  Lifestyle  . Physical activity:    Days per week: Not on file    Minutes per session: Not on file  . Stress: Not on file  Relationships  . Social connections:    Talks on phone: Not on file    Gets together: Not on file    Attends religious service: Not on file    Active member of club or organization: Not on file    Attends meetings of clubs or organizations: Not on file    Relationship status: Not on file  Other Topics Concern  . Not on  file  Social History Narrative  . Not on file    Outpatient Encounter Medications as of 06/13/2018  Medication Sig  . Adalimumab 40 MG/0.8ML PNKT Inject into the skin.  Marland Kitchen ALPRAZolam (XANAX) 0.5 MG tablet TAKE 1 TABLET BY MOUTH 3 TIMES DAILY AS NEEDED FOR ANXIETY OR SLEEP  . ARIPiprazole (ABILIFY) 10 MG tablet Take 10 mg by mouth daily.  . Armodafinil 250 MG tablet Take by mouth.  Marland Kitchen BIOTIN PO Take 1 tablet by mouth daily.  Marland Kitchen BYSTOLIC 10 MG tablet TAKE 1 TABLET BY MOUTH EVERY DAY  . calcium citrate (CALCITRATE - DOSED IN MG ELEMENTAL CALCIUM) 950 MG tablet Take 1 tablet by mouth daily.  . clonazePAM (KLONOPIN) 1 MG tablet   . cyclobenzaprine (FLEXERIL) 10 MG tablet Take 1 tablet (10 mg total) by mouth 3 (three) times daily as needed.  . DULoxetine (CYMBALTA) 60 MG capsule Take 2 capsules (120 mg total) by mouth daily.  . FEROSUL 325 (65 Fe) MG tablet TAKE 1 TABLET BY MOUTH EVERY DAY WITH BREAKFAST  . gabapentin (NEURONTIN) 800 MG tablet TAKE 1 TABLET BY MOUTH 3 TIMES DAILY  . HYDROcodone-acetaminophen (NORCO) 10-325 MG tablet Take 1 tablet by mouth every 6 (six) hours as needed.  . meclizine (ANTIVERT) 25 MG tablet Take 1 tablet (25 mg total) by mouth 3 (three) times daily as needed for dizziness.  . OXcarbazepine (TRILEPTAL) 150 MG tablet Take 150 mg by mouth 2 (two) times daily.  . pantoprazole (PROTONIX) 40 MG tablet TAKE 1 TABLET BY MOUTH 2 TIMES DAILY  . pentoxifylline (TRENTAL) 400 MG CR tablet TAKE 1 TABLET BY MOUTH 3 TIMES DAILY WITH A MEAL  . potassium iodide (SSKI) 1 GM/ML solution Take 25 mg 3 (three) times daily by mouth.  . promethazine (PHENERGAN) 25 MG tablet TAKE 1 TABLET BY MOUTH EVERY 6 HOURS AS NEEDED FOR NAUSEA AND VOMITING  . rosuvastatin (CRESTOR) 20 MG tablet TAKE 1 TABLET BY MOUTH EVERY DAY  . furosemide (LASIX) 40 MG tablet TAKE 2 AND 1/2 TABLETS BY MOUTH EVERY DAY  . [DISCONTINUED] ciprofloxacin (CIPRO) 500 MG tablet Take 1 tablet (500 mg total) by mouth 2 (two)  times daily.   No facility-administered encounter medications on file as of 06/13/2018.     Activities of Daily Living In your present state of health, do you have any difficulty performing the following activities: 06/13/2018 02/05/2018  Hearing? N N  Vision? N N  Difficulty concentrating or making decisions? N N  Walking or climbing stairs? Y N  Dressing or bathing? N N  Doing errands, shopping? N N  Preparing Food and eating ? N -  Using the Toilet? N -  In the past six months, have you accidently leaked urine? N -  Do you have problems with loss of bowel control? N -  Managing your Medications? N -  Managing your Finances? N -  Housekeeping or managing your Housekeeping? N -  Some recent data might be hidden    Patient Care Team: [name], MD as PCP - General Eino Farber, PA-C as Consulting Physician (Physician Assistant) Blima Dessert, MD as Referring Physician (Rheumatology) Loa Socks, MD as Referring Physician (Dermatology) Terrance Mass, MD (Inactive) (Gynecology) Phineas Real Belinda Block, MD as Consulting Physician (Gynecology)    Assessment:   This is a routine wellness examination for Jenayah.  Exercise Activities and Dietary recommendations Current Exercise Habits: The patient does not participate in regular exercise at present, Exercise limited by: orthopedic condition(s);Other - see comments(muscular/leg pain)   Diet (meal preparation, eat out, water intake, caffeinated beverages, dairy products, fruits and vegetables): Drinks water, milk and soda.   Breakfast: Skips Lunch: sandwich ([place]) Dinner: protein (grilled chicken) and vegetables   Goals    . Increase physical activity     Increase activity by walking more.     . Weight (lb) < 320 lb (145.2 kg)     Lose weight by watching portion control, healthy options and increasing activity as tolerated.        Fall Risk Fall Risk  06/13/2018 02/05/2018 12/21/2016 11/24/2015 06/12/2015  Falls in  the past year? 0 No No No No  Number falls in past yr: - - - - -  Injury with Fall? - - - - -  Risk for fall due to : - - - - -  Risk for fall due to: Comment - - - - -  Follow up - - - - -    Depression Screen PHQ 2/9 Scores 06/13/2018 02/05/2018 07/04/2017 12/21/2016  PHQ - 2 Score 0 0 0 0  PHQ- 9 Score 2 0 0 -  Exception Documentation - - - -     Cognitive Function       Ad8 score reviewed for issues:  Issues making decisions: no  Less interest in hobbies / activities: no  Repeats questions, stories (family complaining): no  Trouble using ordinary gadgets (microwave, computer, phone): no  Forgets the month or year: no  Mismanaging finances: no  Remembering appts: no  Daily problems with thinking and/or memory: no Ad8 score is=0     Immunization History  Administered Date(s) Administered  . Influenza Split 03/17/2011  . Influenza Whole 02/15/2010  . Influenza,inj,Quad PF,6+ Mos 05/17/2015, 02/23/2016, 02/13/2017  . Influenza-Unspecified 02/27/2013, 01/11/2018  . Pneumococcal Conjugate-13 10/04/2017  . Pneumococcal Polysaccharide-23 03/17/2011, 01/10/2018  . Td 05/16/2002      Screening Tests Health Maintenance  Topic Date Due  . TETANUS/TDAP  04/28/2019 (Originally 05/16/2012)  . HIV Screening  04/28/2019 (Originally 12/04/1984)  . INFLUENZA VACCINE  Completed        Plan:    Schedule eye exam ([facility])  Schedule mammogram.   Bring a copy of your living will and/or healthcare power of attorney to your next office visit.   I have personally reviewed and noted the following in the patient's chart:   . Medical and social history . Use of alcohol, tobacco or illicit drugs  . Current medications and supplements . Functional ability and status . Nutritional status . Physical activity . Advanced directives . List of other physicians . Hospitalizations, surgeries, and ER visits  in previous 12 months . Vitals . Screenings to include  cognitive, depression, and falls . Referrals and appointments  In addition, I have reviewed and discussed with patient certain preventive protocols, quality metrics, and best practice recommendations. A written personalized care plan for preventive services as well as general preventive health recommendations were provided to patient.     Gerilyn Nestle, RN  06/13/2018   PCP Notes:  -Allergy testing at [facility] 08/2018 -Weeping lower extremities x 4 days, appt with specialist mid February. -Appt with [facility] today for legs.   Reviewed documentation provided by RN and agree w/ above.  [name], MD

## 2018-06-13 NOTE — Patient Instructions (Addendum)
Schedule eye exam ([facility])  Schedule mammogram.   Bring a copy of your living will and/or healthcare power of attorney to your next office visit.  Health Maintenance, Female Adopting a healthy lifestyle and getting preventive care can go a long way to promote health and wellness. Talk with your health care provider about what schedule of regular examinations is right for you. This is a good chance for you to check in with your provider about disease prevention and staying healthy. In between checkups, there are plenty of things you can do on your own. Experts have done a lot of research about which lifestyle changes and preventive measures are most likely to keep you healthy. Ask your health care provider for more information. Weight and diet Eat a healthy diet  Be sure to include plenty of vegetables, fruits, low-fat dairy products, and lean protein.  Do not eat a lot of foods high in solid fats, added sugars, or salt.  Get regular exercise. This is one of the most important things you can do for your health. ? Most adults should exercise for at least 150 minutes each week. The exercise should increase your heart rate and make you sweat (moderate-intensity exercise). ? Most adults should also do strengthening exercises at least twice a week. This is in addition to the moderate-intensity exercise. Maintain a healthy weight  Body mass index (BMI) is a measurement that can be used to identify possible weight problems. It estimates body fat based on height and weight. Your health care provider can help determine your BMI and help you achieve or maintain a healthy weight.  For females 33 years of age and older: ? A BMI below 18.5 is considered underweight. ? A BMI of 18.5 to 24.9 is normal. ? A BMI of 25 to 29.9 is considered overweight. ? A BMI of 30 and above is considered obese. Watch levels of cholesterol and blood lipids  You should start having your blood tested for lipids and  cholesterol at 49 years of age, then have this test every 5 years.  You may need to have your cholesterol levels checked more often if: ? Your lipid or cholesterol levels are high. ? You are older than 49 years of age. ? You are at high risk for heart disease. Cancer screening Lung Cancer  Lung cancer screening is recommended for adults 28-43 years old who are at high risk for lung cancer because of a history of smoking.  A yearly low-dose CT scan of the lungs is recommended for people who: ? Currently smoke. ? Have quit within the past 15 years. ? Have at least a 30-pack-year history of smoking. A pack year is smoking an average of one pack of cigarettes a day for 1 year.  Yearly screening should continue until it has been 15 years since you quit.  Yearly screening should stop if you develop a health problem that would prevent you from having lung cancer treatment. Breast Cancer  Practice breast self-awareness. This means understanding how your breasts normally appear and feel.  It also means doing regular breast self-exams. Let your health care provider know about any changes, no matter how small.  If you are in your 20s or 30s, you should have a clinical breast exam (CBE) by a health care provider every 1-3 years as part of a regular health exam.  If you are 62 or older, have a CBE every year. Also consider having a breast X-ray (mammogram) every year.  If  you have a family history of breast cancer, talk to your health care provider about genetic screening.  If you are at high risk for breast cancer, talk to your health care provider about having an MRI and a mammogram every year.  Breast cancer gene (BRCA) assessment is recommended for women who have family members with BRCA-related cancers. BRCA-related cancers include: ? Breast. ? Ovarian. ? Tubal. ? Peritoneal cancers.  Results of the assessment will determine the need for genetic counseling and BRCA1 and BRCA2  testing. Cervical Cancer Your health care provider may recommend that you be screened regularly for cancer of the pelvic organs (ovaries, uterus, and vagina). This screening involves a pelvic examination, including checking for microscopic changes to the surface of your cervix (Pap test). You may be encouraged to have this screening done every 3 years, beginning at age 13.  For women ages 3-65, health care providers may recommend pelvic exams and Pap testing every 3 years, or they may recommend the Pap and pelvic exam, combined with testing for human papilloma virus (HPV), every 5 years. Some types of HPV increase your risk of cervical cancer. Testing for HPV may also be done on women of any age with unclear Pap test results.  Other health care providers may not recommend any screening for nonpregnant women who are considered low risk for pelvic cancer and who do not have symptoms. Ask your health care provider if a screening pelvic exam is right for you.  If you have had past treatment for cervical cancer or a condition that could lead to cancer, you need Pap tests and screening for cancer for at least 20 years after your treatment. If Pap tests have been discontinued, your risk factors (such as having a new sexual partner) need to be reassessed to determine if screening should resume. Some women have medical problems that increase the chance of getting cervical cancer. In these cases, your health care provider may recommend more frequent screening and Pap tests. Colorectal Cancer  This type of cancer can be detected and often prevented.  Routine colorectal cancer screening usually begins at 49 years of age and continues through 49 years of age.  Your health care provider may recommend screening at an earlier age if you have risk factors for colon cancer.  Your health care provider may also recommend using home test kits to check for hidden blood in the stool.  A small camera at the end of a  tube can be used to examine your colon directly (sigmoidoscopy or colonoscopy). This is done to check for the earliest forms of colorectal cancer.  Routine screening usually begins at age 56.  Direct examination of the colon should be repeated every 5-10 years through 49 years of age. However, you may need to be screened more often if early forms of precancerous polyps or small growths are found. Skin Cancer  Check your skin from head to toe regularly.  Tell your health care provider about any new moles or changes in moles, especially if there is a change in a mole's shape or color.  Also tell your health care provider if you have a mole that is larger than the size of a pencil eraser.  Always use sunscreen. Apply sunscreen liberally and repeatedly throughout the day.  Protect yourself by wearing long sleeves, pants, a wide-brimmed hat, and sunglasses whenever you are outside. Heart disease, diabetes, and high blood pressure  High blood pressure causes heart disease and increases the risk of stroke.  High blood pressure is more likely to develop in: ? People who have blood pressure in the high end of the normal range (130-139/85-89 mm Hg). ? People who are overweight or obese. ? People who are African American.  If you are 55-60 years of age, have your blood pressure checked every 3-5 years. If you are 52 years of age or older, have your blood pressure checked every year. You should have your blood pressure measured twice-once when you are at a hospital or clinic, and once when you are not at a hospital or clinic. Record the average of the two measurements. To check your blood pressure when you are not at a hospital or clinic, you can use: ? An automated blood pressure machine at a pharmacy. ? A home blood pressure monitor.  If you are between 66 years and 28 years old, ask your health care provider if you should take aspirin to prevent strokes.  Have regular diabetes screenings. This  involves taking a blood sample to check your fasting blood sugar level. ? If you are at a normal weight and have a low risk for diabetes, have this test once every three years after 49 years of age. ? If you are overweight and have a high risk for diabetes, consider being tested at a younger age or more often. Preventing infection Hepatitis B  If you have a higher risk for hepatitis B, you should be screened for this virus. You are considered at high risk for hepatitis B if: ? You were born in a country where hepatitis B is common. Ask your health care provider which countries are considered high risk. ? Your parents were born in a high-risk country, and you have not been immunized against hepatitis B (hepatitis B vaccine). ? You have HIV or AIDS. ? You use needles to inject street drugs. ? You live with someone who has hepatitis B. ? You have had sex with someone who has hepatitis B. ? You get hemodialysis treatment. ? You take certain medicines for conditions, including cancer, organ transplantation, and autoimmune conditions. Hepatitis C  Blood testing is recommended for: ? Everyone born from [birth year] through 1966. ? Anyone with known risk factors for hepatitis C. Sexually transmitted infections (STIs)  You should be screened for sexually transmitted infections (STIs) including gonorrhea and chlamydia if: ? You are sexually active and are younger than 49 years of age. ? You are older than 49 years of age and your health care provider tells you that you are at risk for this type of infection. ? Your sexual activity has changed since you were last screened and you are at an increased risk for chlamydia or gonorrhea. Ask your health care provider if you are at risk.  If you do not have HIV, but are at risk, it may be recommended that you take a prescription medicine daily to prevent HIV infection. This is called pre-exposure prophylaxis (PrEP). You are considered at risk if: ? You are  sexually active and do not regularly use condoms or know the HIV status of your partner(s). ? You take drugs by injection. ? You are sexually active with a partner who has HIV. Talk with your health care provider about whether you are at high risk of being infected with HIV. If you choose to begin PrEP, you should first be tested for HIV. You should then be tested every 3 months for as long as you are taking PrEP. Pregnancy  If you are premenopausal and  you may become pregnant, ask your health care provider about preconception counseling.  If you may become pregnant, take 400 to 800 micrograms (mcg) of folic acid every day.  If you want to prevent pregnancy, talk to your health care provider about birth control (contraception). Osteoporosis and menopause  Osteoporosis is a disease in which the bones lose minerals and strength with aging. This can result in serious bone fractures. Your risk for osteoporosis can be identified using a bone density scan.  If you are 69 years of age or older, or if you are at risk for osteoporosis and fractures, ask your health care provider if you should be screened.  Ask your health care provider whether you should take a calcium or vitamin D supplement to lower your risk for osteoporosis.  Menopause may have certain physical symptoms and risks.  Hormone replacement therapy may reduce some of these symptoms and risks. Talk to your health care provider about whether hormone replacement therapy is right for you. Follow these instructions at home:  Schedule regular health, dental, and eye exams.  Stay current with your immunizations.  Do not use any tobacco products including cigarettes, chewing tobacco, or electronic cigarettes.  If you are pregnant, do not drink alcohol.  If you are breastfeeding, limit how much and how often you drink alcohol.  Limit alcohol intake to no more than 1 drink per day for nonpregnant women. One drink equals 12 ounces of  beer, 5 ounces of wine, or 1 ounces of hard liquor.  Do not use street drugs.  Do not share needles.  Ask your health care provider for help if you need support or information about quitting drugs.  Tell your health care provider if you often feel depressed.  Tell your health care provider if you have ever been abused or do not feel safe at home. This information is not intended to replace advice given to you by your health care provider. Make sure you discuss any questions you have with your health care provider. Document Released: 11/15/2010 Document Revised: 10/08/2015 Document Reviewed: 02/03/2015 [employer] Interactive Patient Education  2019 [employer].

## 2018-06-13 NOTE — Progress Notes (Signed)
Patient with history of chronic lymphedema, peripheral edema, PAN and venous stasis presents to clinic today c/o 4 days of weeping fluid from her R posterior calf. Denies any trauma or injury to the leg. Notes chronic redness of bilateral lower extremities, not worsened since onset of current symptoms. States that her leg is "crying" and liquid is soaking through pants. Patient is prescribed to take Lasix 100 mg daily but has not been taking since November. Denies any chest pain, palpitations, SOB, PND or orthopnea.Only difference she has noted is the leakage from legs. Has follow-up with her specialist next week.   Past Medical History:  Diagnosis Date  . Allergic rhinitis   . Ankle fracture, right   . Anxiety   . ASCUS of cervix with negative high risk HPV 05/2018  . Carpal tunnel syndrome   . Cervical cancer ([place]) 1998  . Fibromyalgia   . Hyperlipidemia   . Hypertension   . Migraine headache   . Obesity   . Sleep apnea     Current Outpatient Medications on File Prior to Visit  Medication Sig Dispense Refill  . Adalimumab 40 MG/0.8ML PNKT Inject into the skin.    Marland Kitchen ALPRAZolam (XANAX) 0.5 MG tablet TAKE 1 TABLET BY MOUTH 3 TIMES DAILY AS NEEDED FOR ANXIETY OR SLEEP 90 tablet 3  . ARIPiprazole (ABILIFY) 10 MG tablet Take 10 mg by mouth daily.    . Armodafinil 250 MG tablet Take by mouth.    Marland Kitchen BIOTIN PO Take 1 tablet by mouth daily.    Marland Kitchen BYSTOLIC 10 MG tablet TAKE 1 TABLET BY MOUTH EVERY DAY 90 tablet 0  . calcium citrate (CALCITRATE - DOSED IN MG ELEMENTAL CALCIUM) 950 MG tablet Take 1 tablet by mouth daily.    . clonazePAM (KLONOPIN) 1 MG tablet     . cyclobenzaprine (FLEXERIL) 10 MG tablet Take 1 tablet (10 mg total) by mouth 3 (three) times daily as needed. 270 tablet 0  . DULoxetine (CYMBALTA) 60 MG capsule Take 2 capsules (120 mg total) by mouth daily. 240 capsule 2  . FEROSUL 325 (65 Fe) MG tablet TAKE 1 TABLET BY MOUTH EVERY DAY WITH BREAKFAST 30 tablet 6  . furosemide  (LASIX) 40 MG tablet TAKE 2 AND 1/2 TABLETS BY MOUTH EVERY DAY 225 tablet 0  . gabapentin (NEURONTIN) 800 MG tablet TAKE 1 TABLET BY MOUTH 3 TIMES DAILY 90 tablet 1  . HYDROcodone-acetaminophen (NORCO) 10-325 MG tablet Take 1 tablet by mouth every 6 (six) hours as needed. 90 tablet 0  . meclizine (ANTIVERT) 25 MG tablet Take 1 tablet (25 mg total) by mouth 3 (three) times daily as needed for dizziness. 30 tablet 0  . OXcarbazepine (TRILEPTAL) 150 MG tablet Take 150 mg by mouth 2 (two) times daily.  0  . pantoprazole (PROTONIX) 40 MG tablet TAKE 1 TABLET BY MOUTH 2 TIMES DAILY 180 tablet 1  . pentoxifylline (TRENTAL) 400 MG CR tablet TAKE 1 TABLET BY MOUTH 3 TIMES DAILY WITH A MEAL  11  . potassium iodide (SSKI) 1 GM/ML solution Take 25 mg 3 (three) times daily by mouth.    . promethazine (PHENERGAN) 25 MG tablet TAKE 1 TABLET BY MOUTH EVERY 6 HOURS AS NEEDED FOR NAUSEA AND VOMITING 45 tablet 3  . rosuvastatin (CRESTOR) 20 MG tablet TAKE 1 TABLET BY MOUTH EVERY DAY 90 tablet 1   No current facility-administered medications on file prior to visit.     Allergies  Allergen Reactions  . Niacin  Other (See Comments)    REACTION: Swelling, problems breathing  . Sulfamethoxazole-Trimethoprim Other (See Comments)    REACTION: Rash, throat closed, eyes swelled.  . Aspirin Hives  . Bactrim Hives  . Benzoin Compound Rash  . Cephalexin Hives  . Clindamycin/Lincomycin Hives  . Doxycycline Hives  . Iohexol Hives    Pt treated with PO benedryl  . Lisinopril Hives  . Naproxen Hives  . Pnu-Imune [Pneumococcal Polysaccharide Vaccine] Rash  . Sulfonamide Derivatives Rash    Family History  Problem Relation Age of Onset  . Diabetes Mother   . Emphysema Mother   . Allergies Mother   . Hypertension Mother   . Heart disease Father   . Allergies Father   . Prostate cancer Father   . Breast cancer Maternal Grandmother 34    Social History   Socioeconomic History  . Marital status: Single     Spouse name: Not on file  . Number of children: Not on file  . Years of education: Not on file  . Highest education level: Not on file  Occupational History  . 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 Financial resource strain: Not on file  . Food insecurity:    Worry: Not on file    Inability: Not on file  . Transportation needs:    Medical: Not on file    Non-medical: Not on file  Tobacco Use  . Smoking status: Never Smoker  . Smokeless tobacco: Never Used  Substance and Sexual Activity  . Alcohol use: No  . Drug use: No  . Sexual activity: Yes    Comment: TAH-1st intercourse 39 yo-5 partners  Lifestyle  . Physical activity:    Days per week: Not on file    Minutes per session: Not on file  . Stress: Not on file  Relationships  . Social connections:    Talks on phone: Not on file    Gets together: Not on file    Attends religious service: Not on file    Active member of club or organization: Not on file    Attends meetings of clubs or organizations: Not on file    Relationship status: Not on file  Other Topics Concern  . Not on file  Social History Narrative  . Not on file   Review of Systems - See HPI.  All other ROS are negative.  BP 140/78   Pulse 72   Temp 98 F (36.7 C) (Oral)   Resp 18   Ht 5\' 3"  (1.6 m)   Wt (!) 344 lb (156 kg)   SpO2 98%   BMI 60.94 kg/m   Physical Exam Vitals signs reviewed.  Constitutional:      Appearance: She is obese.  HENT:     Head: Normocephalic and atraumatic.  Cardiovascular:     Rate and Rhythm: Normal rate and regular rhythm.     Heart sounds: Normal heart sounds.     Comments: 2+ pitting edema of lower extremities bilaterally up to level of mid calf. Chronic venous stasis skin changes noted with some mild ulceration bilaterally in various stages of healing. Mild warmth of extremity without induration or tenderness. Legs weeping bilaterally but R > L.  Pulmonary:     Effort: Pulmonary  effort is normal.     Breath sounds: Normal breath sounds.  Neurological:     Mental Status: She is alert.     Recent Results (from the past 2160 hour(s))  POCT Urinalysis Dipstick     Status: Normal   Collection Time: 04/03/18 10:53 AM  Result Value Ref Range   Color, UA yellow    Clarity, UA clear    Glucose, UA Negative Negative   Bilirubin, UA Negative    Ketones, UA Negative    Spec Grav, UA 1.015 1.010 - 1.025   Blood, UA Negative    pH, UA 6.0 5.0 - 8.0   Protein, UA Negative Negative   Urobilinogen, UA 0.2 0.2 or 1.0 E.U./dL   Nitrite, UA Nrgative    Leukocytes, UA Negative Negative   Appearance     Odor    Urine Culture     Status: None   Collection Time: 04/03/18 11:17 AM  Result Value Ref Range   MICRO NUMBER: [id number]    SPECIMEN QUALITY: Adequate    Sample Source NOT GIVEN    STATUS: FINAL    Result:      Three or more organisms present, each greater than 10,000 cu/mL. May represent normal flora contamination from external genitalia. No further testing is required.  POCT urinalysis dipstick     Status: Abnormal   Collection Time: 04/27/18  8:40 AM  Result Value Ref Range   Color, UA amber    Clarity, UA clear    Glucose, UA Negative Negative   Bilirubin, UA negative    Ketones, UA negative    Spec Grav, UA 1.025 1.010 - 1.025   Blood, UA negative    pH, UA 6.0 5.0 - 8.0   Protein, UA Positive (A) Negative   Urobilinogen, UA 1.0 0.2 or 1.0 E.U./dL   Nitrite, UA negative    Leukocytes, UA Negative Negative   Appearance     Odor    Urine Culture     Status: None   Collection Time: 04/27/18 11:26 AM  Result Value Ref Range   MICRO NUMBER: [id number]    SPECIMEN QUALITY: Adequate    Sample Source URINE    STATUS: FINAL    Result:      Multiple organisms present, each less than 10,000 CFU/mL. These organisms, commonly found on external and internal genitalia, are considered to be colonizers. No further testing performed.  Lipid panel     Status: None    Collection Time: 05/11/18  2:15 PM  Result Value Ref Range   Cholesterol 108 0 - 200 mg/dL    Comment: ATP III Classification       Desirable:  < 200 mg/dL               Borderline High:  200 - 239 mg/dL          High:  > = 240 mg/dL   Triglycerides 91.0 0.0 - 149.0 mg/dL    Comment: Normal:  <150 mg/dLBorderline High:  150 - 199 mg/dL   HDL 41.80 >39.00 mg/dL   VLDL 18.2 0.0 - 40.0 mg/dL   LDL Cholesterol 48 0 - 99 mg/dL   Total CHOL/HDL Ratio 3     Comment:                Men          Women1/2 Average Risk     3.4          3.3Average Risk          5.0          4.42X Average Risk          9.6  7.13X Average Risk          15.0          11.0                       NonHDL 66.23     Comment: NOTE:  Non-HDL goal should be 30 mg/dL higher than patient's LDL goal (i.e. LDL goal of < 70 mg/dL, would have non-HDL goal of < 100 mg/dL)  Basic metabolic panel     Status: None   Collection Time: 05/11/18  2:15 PM  Result Value Ref Range   Sodium 135 135 - 145 mEq/L   Potassium 4.5 3.5 - 5.1 mEq/L   Chloride 100 96 - 112 mEq/L   CO2 29 19 - 32 mEq/L   Glucose, Bld 78 70 - 99 mg/dL   BUN 8 6 - 23 mg/dL   Creatinine, Ser 0.56 0.40 - 1.20 mg/dL   Calcium 9.0 8.4 - 10.5 mg/dL   GFR 122.58 >60.00 mL/min  TSH     Status: None   Collection Time: 05/11/18  2:15 PM  Result Value Ref Range   TSH 0.94 0.35 - 4.50 uIU/mL  Hepatic function panel     Status: Abnormal   Collection Time: 05/11/18  2:15 PM  Result Value Ref Range   Total Bilirubin 0.3 0.2 - 1.2 mg/dL   Bilirubin, Direct 0.1 0.0 - 0.3 mg/dL   Alkaline Phosphatase 144 (H) 39 - 117 U/L   AST 18 0 - 37 U/L   ALT 14 0 - 35 U/L   Total Protein 6.8 6.0 - 8.3 g/dL   Albumin 3.9 3.5 - 5.2 g/dL  CBC with Differential/Platelet     Status: Abnormal   Collection Time: 05/11/18  2:15 PM  Result Value Ref Range   WBC 7.0 4.0 - 10.5 K/uL   RBC 4.39 3.87 - 5.11 Mil/uL   Hemoglobin 12.6 12.0 - 15.0 g/dL   HCT 38.4 36.0 - 46.0 %   MCV 87.5  78.0 - 100.0 fl   MCHC 32.7 30.0 - 36.0 g/dL   RDW 15.3 11.5 - 15.5 %   Platelets 378.0 150.0 - 400.0 K/uL   Neutrophils Relative % 54.6 43.0 - 77.0 %   Lymphocytes Relative 27.1 12.0 - 46.0 %   Monocytes Relative 9.6 3.0 - 12.0 %   Eosinophils Relative 7.8 (H) 0.0 - 5.0 %   Basophils Relative 0.9 0.0 - 3.0 %   Neutro Abs 3.8 1.4 - 7.7 K/uL   Lymphs Abs 1.9 0.7 - 4.0 K/uL   Monocytes Absolute 0.7 0.1 - 1.0 K/uL   Eosinophils Absolute 0.5 0.0 - 0.7 K/uL   Basophils Absolute 0.1 0.0 - 0.1 K/uL  Gamma GT     Status: Abnormal   Collection Time: 05/14/18 12:39 PM  Result Value Ref Range   GGT 127 (H) 7 - 51 U/L  Pap IG w/ reflex to HPV when ASC-U     Status: Abnormal   Collection Time: 05/15/18 11:32 AM  Result Value Ref Range   Clinical Information:      Comment: Hysterectomy, total   LMP:      Comment: HYST   PREV. PAP:      Comment: NONE GIVEN   PREV. BX:      Comment: NONE GIVEN   HPV DNA Probe-Source      Comment: Vagina   STATEMENT OF ADEQUACY:      Comment: SATISFACTORY FOR EVALUATION   GENERAL  CATEGORIZATION: (A)     Comment: EPITHELIAL CELL ABNORMALITY   INTERPRETATION/RESULT: (A)     Comment: Atypical Squamous Cells of Undetermined Significance (ASC-US)    Comment:      Comment: This Pap test has been evaluated with computer assisted technology. Suggest clinical correlation and follow-up as clinically appropriate    CYTOTECHNOLOGIST:      Comment: JWW, CT(ASCP) CT screening location: [address], [place], [place] [address]    PATHOLOGIST:      Comment: Tawana Scale. Fields, MD, Board Certification in Anatomic/Clinical Pathology and Cytopathology Electronically Signed EXPLANATORY NOTE:  . The Pap is a screening test for cervical cancer. It is  not a diagnostic test and is subject to false negative  and false positive results. It is most reliable when a  satisfactory sample, regularly obtained, is submitted  with relevant clinical findings and  history, and when  the Pap result is evaluated along with historic and  current clinical information. .   Human papillomavirus, high risk     Status: None   Collection Time: 05/15/18 11:32 AM  Result Value Ref Range   HPV DNA High Risk Not Detected Not Detect    Comment: This test was performed using the APTIMA HPV Assay ([employer] . This assay detects E6/E7 viral messenger RNA (mRNA) from 14 high-risk HPV types (16,18,31,33,35,39,45,51,52,56,58,59,66,68). . The analytical performance characteristics of this assay have been determined by [facility]. The modifications have not been cleared or approved by the FDA. This assay has been validated pursuant to the CLIA regulations and is used for clinical purposes.     Assessment/Plan: 1. Chronic venous stasis 2. PAN (polyarteritis nodosa) (HCC) 3. Lymphedema  Increase in peripheral edema and lymphedema. Is not taking her Lasix as directed. Is overdue for her lymh drainage massage. She is to schedule as soon as she can. Elevate extremities. Restart Lasix at 40 mg daily for the next couple of days so we can find a balance between adequate diuresis while avoiding incontinence. Feel it is reasonable to start lower and work back up based on tolerability and response as weight is stable from last check and heart and lung exams are unremarkable. Proper wound care reviewed for her healing ulcers. Will monitor closely. She is to follow-up here Monday and with her specialist later next week as scheduled. ER precautions reviewed.    Leeanne Rio, PA-C

## 2018-06-13 NOTE — Patient Instructions (Signed)
Please restart Lasix taking 1/2 tablet twice daily over the next 3 days.   Limit salt intake. Elevate legs while resting. Apply Maxi pads to the weeping areas to help absorb and prevent the surrounding skin from becoming so macerated. Use Ace bandage or Coban given to help keep these attached. No tape!!!  Call me on Friday to let me know how symptoms are so we can adjust things if needed before weekend. Follow-up with me on Monday for reassessment.

## 2018-06-15 ENCOUNTER — Encounter: Payer: Self-pay | Admitting: Physician Assistant

## 2018-06-19 MED FILL — HUMIRA PEN 40 MG/0.8 ML SUBCUTANEOUS KIT: 28 days supply | Qty: 4 | Fill #6 | Status: AC

## 2018-06-19 MED FILL — HUMIRA PEN 40 MG/0.8 ML SUBCUTANEOUS KIT: SUBCUTANEOUS | 28 days supply | Qty: 4 | Fill #6

## 2018-06-21 ENCOUNTER — Encounter: Payer: Self-pay | Admitting: Physician Assistant

## 2018-06-21 ENCOUNTER — Encounter: Payer: Self-pay | Admitting: Family Medicine

## 2018-06-21 ENCOUNTER — Other Ambulatory Visit: Payer: Self-pay | Admitting: Family Medicine

## 2018-06-21 NOTE — Telephone Encounter (Signed)
This should not be due until 2/25 since pt was given 90 day supply on 11/25

## 2018-06-21 NOTE — Telephone Encounter (Signed)
Last refill:04/09/18 #270, 0 Last OV:06/13/18

## 2018-06-22 NOTE — Telephone Encounter (Deleted)
Copied from [place] [id number]. Topic: Referral - Status >> Jun 21, 2018  5:10 PM Selinda Flavin B, [place] wrote: Reason for CRM: Patient calling and states that she has not heard anything from the liver specialist. States that she thought there was 2 referrals placed, one to [facility] and then one to [place] something. Could not remember the full name of the other office. Could not find mention of it in OV notes or messages. Please advise.

## 2018-06-22 NOTE — Telephone Encounter (Unsigned)
Copied from [place] [id number]. Topic: Referral - Status >> Jun 21, 2018  5:10 PM Selinda Flavin B, [place] wrote: Reason for CRM: Patient calling and states that she has not heard anything from the liver specialist. States that she thought there was 2 referrals placed, one to [facility] and then one to [place] something. Could not remember the full name of the other office. Could not find mention of it in OV notes or messages. Please advise.

## 2018-06-22 NOTE — Telephone Encounter (Signed)
Copied from [place] [id number]. Topic: Referral - Status >> Jun 21, 2018  5:10 PM Selinda Flavin B, [place] wrote: Reason for CRM: Patient calling and states that she has not heard anything from the liver specialist. States that she thought there was 2 referrals placed, one to [facility] and then one to [place] something. Could not remember the full name of the other office. Could not find mention of it in OV notes or messages. Please advise.

## 2018-06-25 ENCOUNTER — Other Ambulatory Visit: Payer: Self-pay | Admitting: Family Medicine

## 2018-06-25 NOTE — Telephone Encounter (Signed)
Last refill:06/13/18 Last OV:05/29/18 #90, 0

## 2018-06-26 ENCOUNTER — Encounter: Payer: Self-pay | Admitting: Family Medicine

## 2018-06-26 ENCOUNTER — Other Ambulatory Visit: Payer: Self-pay | Admitting: General Practice

## 2018-06-26 ENCOUNTER — Other Ambulatory Visit: Payer: Self-pay | Admitting: Family Medicine

## 2018-06-26 MED ORDER — HYDROCODONE-ACETAMINOPHEN 10-325 MG PO TABS: 1.0000 | ORAL_TABLET | Freq: Four times a day (QID) | ORAL | 0 refills | Status: DC | PRN

## 2018-06-26 MED ORDER — PROMETHAZINE HCL 25 MG PO TABS: ORAL_TABLET | ORAL | 3 refills | Status: DC

## 2018-06-26 NOTE — Telephone Encounter (Signed)
Last OV 06/13/18 Hydrocodone last filled 05/29/18 #90 with 0

## 2018-07-11 ENCOUNTER — Other Ambulatory Visit: Payer: Self-pay | Admitting: Family Medicine

## 2018-07-13 ENCOUNTER — Other Ambulatory Visit: Payer: Self-pay | Admitting: Family Medicine

## 2018-07-16 NOTE — Telephone Encounter (Signed)
Last OV 06/13/18 Clonazepam   I thought pt was on only alprazolam currently?

## 2018-07-19 ENCOUNTER — Other Ambulatory Visit: Payer: Medicare Other

## 2018-07-19 ENCOUNTER — Ambulatory Visit: Payer: Medicare Other | Admitting: Gynecology

## 2018-07-19 DIAGNOSIS — L409 Psoriasis, unspecified: Principal | ICD-10-CM

## 2018-07-19 MED ORDER — HALOBETASOL PROPIONATE 0.05 % TOPICAL OINTMENT
OPHTHALMIC | 1 refills | 0.00000 days | Status: CP
Start: 2018-07-19 — End: ?

## 2018-07-19 NOTE — Unmapped (Signed)
Patient Request Refill for halobetasol  Last OV: 05/17/2018  Next Appt: 08/16/2018   Rx pending for approval

## 2018-07-23 ENCOUNTER — Other Ambulatory Visit: Payer: Self-pay | Admitting: Family Medicine

## 2018-07-23 NOTE — Telephone Encounter (Signed)
Last OV 06/13/18 Hydrocodone last filled 06/26/18 #90 with 0

## 2018-07-24 ENCOUNTER — Encounter: Payer: Self-pay | Admitting: Family Medicine

## 2018-07-24 MED ORDER — HYDROCODONE-ACETAMINOPHEN 10-325 MG PO TABS: 1.0000 | ORAL_TABLET | Freq: Four times a day (QID) | ORAL | 0 refills | Status: DC | PRN

## 2018-07-25 ENCOUNTER — Other Ambulatory Visit: Payer: Medicare Other

## 2018-07-25 ENCOUNTER — Ambulatory Visit: Payer: Medicare Other | Admitting: Gynecology

## 2018-07-25 DIAGNOSIS — K760 Fatty (change of) liver, not elsewhere classified: Secondary | ICD-10-CM | POA: Diagnosis not present

## 2018-07-25 DIAGNOSIS — R748 Abnormal levels of other serum enzymes: Secondary | ICD-10-CM | POA: Diagnosis not present

## 2018-07-26 ENCOUNTER — Other Ambulatory Visit: Payer: Self-pay | Admitting: Nurse Practitioner

## 2018-07-26 DIAGNOSIS — K740 Hepatic fibrosis: Secondary | ICD-10-CM | POA: Diagnosis not present

## 2018-07-26 DIAGNOSIS — K760 Fatty (change of) liver, not elsewhere classified: Secondary | ICD-10-CM

## 2018-07-26 NOTE — Unmapped (Signed)
[facility] Refill Coordination Note    Specialty Medication(s) to be Shipped:   Inflammatory Disorders: Humira    Other medication(s) to be shipped: na     Autumn Mcdonald, DOB: April 04, 1971  Phone: [phone] (home)       All above HIPAA information was verified with patient.     Completed refill call assessment today to schedule patient's medication shipment from the [facility] [phone]).       Specialty medication(s) and dose(s) confirmed: Regimen is correct and unchanged.   Changes to medications: Autumn Mcdonald reports no changes reported at this time.  Changes to insurance: No  Questions for the pharmacist: No    Confirmed patient received Welcome Packet with first shipment. The patient will receive a drug information handout for each medication shipped and additional FDA Medication Guides as required.       DISEASE/MEDICATION-SPECIFIC INFORMATION        N/A    SPECIALTY MEDICATION ADHERENCE     Medication Adherence    Patient reported X missed doses in the last month:  3  Specialty Medication:  humira. pt was sick but is now on track again  Patient is on additional specialty medications:  No  Patient is on more than two specialty medications:  No  Any gaps in refill history greater than 2 weeks in the last 3 months:  no  Demonstrates understanding of importance of adherence:  yes  Informant:  patient  Reliability of informant:  reliable  Confirmed plan for next specialty medication refill:  delivery by pharmacy  Refills needed for supportive medications:  not needed          Refill Coordination    Has the Patients' Contact Information Changed:  No           humira pen 40mg /0.81ml injectionn. Patient has 2 pens remaining. Taking one tomorrow.       SHIPPING     Shipping address confirmed in Epic.     Delivery Scheduled: Yes, Expected medication delivery date: 031820.     Medication will be delivered via UPS to the home address in Epic WAM.    Autumn Mcdonald D Drew Herman   [facility] Specialty Technician

## 2018-07-31 ENCOUNTER — Encounter: Payer: Self-pay | Admitting: Family Medicine

## 2018-07-31 MED FILL — HUMIRA PEN 40 MG/0.8 ML SUBCUTANEOUS KIT: SUBCUTANEOUS | 28 days supply | Qty: 4 | Fill #7

## 2018-07-31 MED FILL — HUMIRA PEN 40 MG/0.8 ML SUBCUTANEOUS KIT: 28 days supply | Qty: 4 | Fill #7 | Status: AC

## 2018-08-01 ENCOUNTER — Encounter: Payer: Self-pay | Admitting: Family Medicine

## 2018-08-03 ENCOUNTER — Other Ambulatory Visit: Payer: Self-pay | Admitting: Family Medicine

## 2018-08-06 ENCOUNTER — Other Ambulatory Visit: Payer: Medicare Other

## 2018-08-06 ENCOUNTER — Ambulatory Visit: Payer: Medicare Other | Admitting: Gynecology

## 2018-08-09 ENCOUNTER — Other Ambulatory Visit: Payer: Medicare Other

## 2018-08-09 ENCOUNTER — Ambulatory Visit: Payer: Medicare Other | Admitting: Gynecology

## 2018-08-09 ENCOUNTER — Ambulatory Visit: Payer: Medicare Other

## 2018-08-15 ENCOUNTER — Other Ambulatory Visit: Payer: Self-pay | Admitting: Family Medicine

## 2018-08-16 ENCOUNTER — Other Ambulatory Visit: Payer: Self-pay | Admitting: Emergency Medicine

## 2018-08-16 DIAGNOSIS — L400 Psoriasis vulgaris: Secondary | ICD-10-CM | POA: Diagnosis not present

## 2018-08-16 DIAGNOSIS — Z79899 Other long term (current) drug therapy: Secondary | ICD-10-CM | POA: Diagnosis not present

## 2018-08-16 DIAGNOSIS — I890 Lymphedema, not elsewhere classified: Secondary | ICD-10-CM | POA: Diagnosis not present

## 2018-08-16 DIAGNOSIS — I872 Venous insufficiency (chronic) (peripheral): Secondary | ICD-10-CM | POA: Diagnosis not present

## 2018-08-16 MED ORDER — FUROSEMIDE 40 MG PO TABS: ORAL_TABLET | ORAL | 0 refills | Status: DC

## 2018-08-16 NOTE — Unmapped (Signed)
Teledermatology Note- Return Patient       Encounter Description/Consent: This encounter was conducted from Provider home office  via telephone with the patient.  The patient verified his/her identity with their date of birth and verbally consented to evaluation and management of their condition through telemedicine.     NOTE: Telemedicine enables health care providers at different locations to provide safe, effective, and convenient care through the use of technology. As with any health care service, there are risks associated with the use of telemedicine and teledermatology, including lack of visualization,and there may be instances where the patient needs to come to clinic to complete/augment the assessment. The patient verbally understands the risks and benefits of teledermatology as explained. This visit was performed via telemedicine during the COVID-19 Health Crisis during a State of [address].   The patient is aware that we will bill their insurance for this visit following Medicare/insurance guidelines, and they may be responsible for some or all of the visit charges if  insurance deems this a non-covered service.  The impression and recommendations were made on the basis of the history obtained via phone and physical exam reviewed electronically, which is limited by/to the uploaded images and their intrinsic quality. All questions were answered, and the patient agreed to proceed.    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 Eyvonne Left favoring nodular vasculitis/erythema induratum.  - While there are not venous changes on duplex, Dr. Beverlee Nims prior review of the article Does severe venous insufficiency have a different etiology in the morbidly obese? ??Is it venous? by Telford Nab et al, suggests that her morbid obesity contributes to impedence of both venous return and lymph return resulting in lymphovenous hypertension and thus changes of venous stasis and lymphedema seen on clinically on her exam.  - Continue on Humira 40mg  SQ weekly for EN component.  - Given that she is feeling temperature intolerance and does not feel comfortable during the pandemic going to the labs for thyroid labs, we will taper off the potassium iodide (SSKI) 1 gram/mL solution 7 drops from TID in OJ to BID x2 weeks then once daily x2 weeks.  - Continue pentoxifylline 400mg  TID  - Continue horse chestnut extract 250mg  BID  - Continue Gabapentin 800 mg BID-TID.  - Continue to encourage compression if possible-- she has purchased SCDs online, which she finds helpful.Previously discussed wearing compression stockings immediately after waking when legs are least swollen. Unfortunately, insurance will not cover lymphedema pump, lymphedema massage, or visits to our lymphedema clinic. She has not yet but will try lymphedema massage out of pocket near her home (~$60).    - lymphedema clinic not planning for any follow up, however I messaged them to see what systems they may have in place to help get her insurance to cover the items above.  Given the coivd-19 pandemic, they are not currently working right now,but [employer] will get back in touch with me after the pandemic.    Plaque psoriasis:  - once weekly humira  - halobetasol BID PRN  ??  High risk medication use (Humira)  - quant gold neg Jan 2020    RV: 3 m    [facility] [id number] helpline # is [phone].   ____________________________________________________________________________________________    CC:  Evaluation of  Lower extremities    HPI:  Autumn Mcdonald is a friendly 48 y.o. female evaluated today in a teledermatology encounter for evaluation of EN and stasis/lymphedema dermatitis.    In regards to her psoriasis, she needs halobetasol a couple of times, but overall feels well controlled    Her lower legs are reduced in edema, but still painful.  The lymphatic massage has been the most helpful, but it is not covered by insurance.  She is using compression devices on the lower extremities, which is helpful.  She bought them online for about $60.  She does say she has a few nodules on the LE.  Those specific areas are not especially tender, but can be painful if touched.  Upon further questioning, she feels this is more of a bumpy texture and distinct from the vasculitis she had in the past.        Pertinent PMH/FH/SH/Allergies, Medications:    Reviewed in Epic    ROS:     Negative for fevers, chills, recent illnesses, GI upset.  She does endorse feeling cold when other are hot.  Denies slowness in motion.  Denies any other skin complaints today.    PE:    General: Conversational, in no obvious distress.  Neuro: Alert, answering questions appropriately.  Skin:NOT EXAMINED      Lennon Alstrom, MD  08/16/2018

## 2018-08-18 ENCOUNTER — Other Ambulatory Visit: Payer: Self-pay | Admitting: Family Medicine

## 2018-08-20 ENCOUNTER — Encounter: Payer: Self-pay | Admitting: Family Medicine

## 2018-08-20 NOTE — Telephone Encounter (Signed)
Last refill:07/24/18 #90, 0 Last OV:05/11/18

## 2018-08-22 ENCOUNTER — Ambulatory Visit: Payer: Medicare Other

## 2018-09-06 ENCOUNTER — Other Ambulatory Visit: Payer: Self-pay | Admitting: Family Medicine

## 2018-09-11 NOTE — Unmapped (Signed)
Autumn Mcdonald reports she is back on track after missing several weeks of Humira due to some personal and family health issues. She still has ~3 weeks of medication remaining at this point, so will push out delivery. She denies infections or other adverse effects.     Autumn Mcdonald    Autumn Mcdonald, DOB: 07-19-1970  Phone: [phone] (home)     All above HIPAA information was verified with patient.     Specialty Medication(s):   Inflammatory Disorders: Humira     Current Outpatient Medications   Medication Sig Dispense Refill   ??? adalimumab (HUMIRA) 40 mg/0.8 mL subcutaneous pen kit Inject the contents of 1 pen (40 mg total) under the skin once a week. 4 each 32   ??? ALPRAZolam (XANAX) 0.5 MG tablet TAKE 1 TABLET BY MOUTH 3 TIMES DAILY AS NEEDED FOR ANXIETY OR SLEEP     ??? ARIPiprazole (ABILIFY) 10 MG tablet Take 10 mg by mouth daily.     ??? armodafinil (NUVIGIL) 250 mg tablet Take 250 mg by mouth three (3) times a day.      ??? clonazePAM (KLONOPIN) 1 MG tablet TAKE 1 TABLET BY MOUTH EVERY DAY     ??? compr.stocking,knee,long,x-lrg Misc 1 each by Miscellaneous route daily. 2 each 0   ??? cyclobenzaprine (FLEXERIL) 10 MG tablet Take 10 mg by mouth.     ??? diclofenac sodium (VOLTAREN) 1 % gel Apply 2 g topically Four (4) times a day. 100 g 6   ??? DULoxetine (CYMBALTA) 60 MG capsule Take 60 mg by mouth daily. 120 mg daily     ??? ferrous sulfate 325 (65 FE) MG tablet Take 1 tablet (325 mg total) by mouth daily with breakfast.  6   ??? furosemide (LASIX) 40 MG tablet Take 40 mg by mouth Two (2) times a day.     ??? gabapentin (NEURONTIN) 600 MG tablet TAKE 1 TABLET BY MOUTH 3 TIMES DAILY 270 tablet 3   ??? halobetasol (ULTRAVATE) 0.05 % ointment APPLY TO AFFECTED AREA 2 TIMES DAILY AS NEEDED. AVOID FACE AND folds. 50 g 1   ??? HYDROcodone-acetaminophen (NORCO 10-325) 10-325 mg per tablet Take 1 tablet by mouth.     ??? mupirocin (BACTROBAN) 2 % ointment APPLY TOPICALLY 3 TIMES DAILY UNTIL AREA HEALED 22 g 1   ??? nebivolol (BYSTOLIC) 10 MG tablet Take 10 mg by mouth.     ??? OXcarbazepine (TRILEPTAL) 150 MG tablet TAKE 1 TABLET BY MOUTH 2 TIMES DAILY  0   ??? pantoprazole (PROTONIX) 40 MG tablet Take 40 mg by mouth Two (2) times a day. bid     ??? pentoxifylline (TRENTAL) 400 mg CR tablet TAKE 1 TABLET BY MOUTH 3 TIMES DAILY WITH A MEAL 90 tablet 2   ??? potassium iodide (SSKI) 1 gram/mL solution 5 drops (0.45ml) three times daily in OJ. 237 mL 1   ??? promethazine (PHENERGAN) 25 MG tablet      ??? rosuvastatin (CRESTOR) 20 MG tablet TAKE 1 TABLET BY MOUTH EVERY DAY       No current facility-administered medications for this visit.         Changes to medications: Autumn Mcdonald reports no changes at this time.    Allergies   Allergen Reactions   ??? Iohexol Hives     Pt treated with PO benedryl   ??? Keflex [Cephalexin] Shortness Of Breath and Hives   ??? Lincomycin Hcl Hives   ???  Lisinopril Hives   ??? Sulfa (Sulfonamide Antibiotics) Swelling and Rash   ??? Sulfamethoxazole-Trimethoprim Hives     REACTION: Rash, throat closed, eyes swelled.   ??? Aspirin      REACTION: Hives   ??? Doxycycline      REACTION: Hives   ??? Naproxen      REACTION: Hives   ??? Niacin      REACTION: Swelling, problems breathing   ??? Benzoin Compound Rash   ??? Pneumococcal 23-Val Ps Vaccine Rash     cellulitis       Changes to allergies: No    SPECIALTY MEDICATION ADHERENCE     Humira - 3 left       Specialty medication(s) dose(s) confirmed: Regimen is correct and unchanged.     Are there any concerns with adherence? Yes: filled last on 2/4, 3/17, and as of 4/28 still has 3 injections left. I suspect she missed 1.5 - 2 months of medication. She reports she has resumed treatment    Adherence counseling provided? deferred - she flared while off, and mentioned she knows she should use her medication to help control    CLINICAL MANAGEMENT AND INTERVENTION      Clinical Benefit Assessment:    Do you feel the medicine is effective or helping your condition? Yes    Clinical Benefit counseling provided? Not needed    Adverse Effects Assessment:    Are you experiencing any side effects? No    Are you experiencing difficulty administering your medicine? No    Quality of Life Assessment:    How many days over the past month did your psoriasis  keep you from your normal activities? For example, brushing your teeth or getting up in the morning. 0    Have you discussed this with your provider? Not needed    Therapy Appropriateness:    Is therapy appropriate? Yes, therapy is appropriate and should be continued    DISEASE/MEDICATION-SPECIFIC INFORMATION      For patients on injectable medications: Patient currently has 3 doses left.  Next injection is scheduled for Friday, May 1.    PATIENT SPECIFIC NEEDS     ? Does the patient have any physical, cognitive, or cultural barriers? No    ? Is the patient high risk? No     ? Does the patient require a Care Management Plan? No     ? Does the patient require physician intervention or other additional services (i.e. nutrition, smoking cessation, social work)? No      SHIPPING     Specialty Medication(s) to be Shipped:   Inflammatory Disorders: Humira    Other medication(s) to be shipped: na     Changes to insurance: No    Delivery Scheduled: Yes, Expected medication delivery date: Tues, May 12.     Medication will be delivered via UPS to the confirmed home address in [facility].    The patient will receive a drug information handout for each medication shipped and additional FDA Medication Guides as required.  Verified that patient has previously received a Conservation officer, historic buildings.    Autumn Mcdonald   Autumn Mcdonald

## 2018-09-12 ENCOUNTER — Encounter: Admit: 2018-09-12 | Discharge: 2018-09-13 | Payer: MEDICARE

## 2018-09-12 DIAGNOSIS — Z79899 Other long term (current) drug therapy: Secondary | ICD-10-CM

## 2018-09-12 DIAGNOSIS — M300 Polyarteritis nodosa: Principal | ICD-10-CM

## 2018-09-12 DIAGNOSIS — F3341 Major depressive disorder, recurrent, in partial remission: Secondary | ICD-10-CM | POA: Diagnosis not present

## 2018-09-12 DIAGNOSIS — F518 Other sleep disorders not due to a substance or known physiological condition: Secondary | ICD-10-CM | POA: Diagnosis not present

## 2018-09-12 DIAGNOSIS — F4321 Adjustment disorder with depressed mood: Secondary | ICD-10-CM | POA: Diagnosis not present

## 2018-09-12 NOTE — Unmapped (Signed)
September 12, 2018 12:01 PM    REASON FOR VISIT: follow-up for possible PAN vs. erythema induratum/nodular vasculitis    Identification: Pt self identified using name and date of birth  Patient location: [place] home, [phone]  The limitations of this telemedicine encounter were discussed with patient. Both the patient and myself agreed to this encounter despite these limitations. Benefits of this telemedicine encounter included allowing for continued care of patient and minimizing risk of exposure to COVID-19. Patient also aware that this is a billable encounter with possible copay.     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 Biopsies in 2016 showed small vessel vasculitis suggestive for PAN in 2016. Started prednisone taper and then mtx in 11/2014. Plaquenil started in 12/2014. Started imuran in 07/2015, and titrated up to 150 mg qd w/o sufficient control of her PAN despite being on methotrexate and hydroxychloroquine concurrently. Thus, in 09/2015 she was started on mycophenolate mofetil, and titrated up to 1500 mg BID. She continued to have persistent leg swelling and pain despite maximized cellcept. There was concern high doses of prednisone was contributing to leg swelling at this point so prednisone tapered off but leg pain worsened off. Given this, she was re-biopsied. Biopsy from 08/2016 more consistent with venous stasis disease, so it was unclear if her symptoms were due to PAN vs venous stasis. As a trial for treatment of PAN, we discontinued mycophenolate mofetil and she was started on dapsone by dermatology. Patient developed LFT abnormalities on dapsone and had worsening of leg symptoms so dapsone was discontinued. Repeat biopsy performed again with findings this time suggestive for erythema induratum/nodular vasculitis so patient was started on oral potassium iodide and receiving intralesional steroid injections starting in November/December 2018. Was evaluated by vascular surgery and no vascular insufficiency was noted, thus her leg swelling has been attributed to lymphedema. Evaluated at lymphedema clinic at [facility] this past summer with recommendation for compression stocking. Placed on pentoxifylline. At follow-up in January 2019, she had developed new skin lesions concerning for psoriasis while taking hydroxychloroquine 200 mg po bid, prednisone 20 mg po qd, and potassium iodide qid. Evaluation by Dermatology confirmed plaque psoriasis. Humira was added in March 2019 by Dermatology.  At follow-up in Dermatology in April 2019, she was advised to taper prednisone.  At follow-up in May 2019 with myself hydroxychloroquine discontinued for concerns of this aggravating newly diagnosed psoriasis.  At last visit with myself in September 2019, she had been able to taper off prednisone following dermatology appointment. She had been able to go three weeks without a major flare off prednisone. She did have episode of cellulitis in July 2019 requiring antibiotic therapy. She did continue adalimumab every other week and potassium iodide qid through this episode of cellulitis.  She is also on pentoxifylline.  She thought her fibromyalgia was maybe more active.    HISTORY: Autumn Mcdonald is a 48 y.o. female with psoriasis and question of PAN vs erythema induratum/nodular vasculitis. Last seen by me in September 2019. Pt presents via phone call for follow up.     Today, patient reports compliance with Humira every week, which was increased two months ago, potassium iodide qid, and pentoxifylline. No longer on any prednisone. She still has an occasional red subcutaneous lesion but not as often or severe. She also had a manual lymphatic drainage massage completed in [place], [place] recommended by Dr. Charlsie Merles. She find this helpful with the swelling in her legs. The therapy  is expensive but she is hoping to continue. She is not very compliant with the compression stockings due to discomfort. She has psoriasis around her knees but controlled with clobetasol.  She reports left hip pain, lateral. Had prior trochanteric bursitis injections in the past with relief and had thought about reaching out for an injection but it self resolved. Not having any left hip pain currently. She reports her legs are a little swollen and can move her skin. She feels like overall everything is stable. She had labs done with [facility] in March 2020, unable to view. In Care Everywhere, see labs in December 2019. PCP ordering her for gabapentin 800 mg bid/tid.     REVIEW OF SYSTEMS: Attests to the above, otherwise all other review of systems is negative.    CURRENT MEDICATIONS:  Current Outpatient Medications   Medication Sig Dispense Refill   ??? adalimumab (HUMIRA) 40 mg/0.8 mL subcutaneous pen kit Inject the contents of 1 pen (40 mg total) under the skin once a week. 4 each 32   ??? ALPRAZolam (XANAX) 0.5 MG tablet TAKE 1 TABLET BY MOUTH 3 TIMES DAILY AS NEEDED FOR ANXIETY OR SLEEP     ??? ARIPiprazole (ABILIFY) 10 MG tablet Take 10 mg by mouth daily.     ??? armodafinil (NUVIGIL) 250 mg tablet Take 250 mg by mouth three (3) times a day.      ??? clonazePAM (KLONOPIN) 1 MG tablet TAKE 1 TABLET BY MOUTH EVERY DAY     ??? compr.stocking,knee,long,x-lrg Misc 1 each by Miscellaneous route daily. 2 each 0   ??? cyclobenzaprine (FLEXERIL) 10 MG tablet Take 10 mg by mouth once as needed.      ??? diclofenac sodium (VOLTAREN) 1 % gel Apply 2 g topically Four (4) times a day. (Patient taking differently: Apply 2 g topically once as needed. ) 100 g 6   ??? DULoxetine (CYMBALTA) 60 MG capsule Take 60 mg by mouth daily. 120 mg daily     ??? ferrous sulfate 325 (65 FE) MG tablet Take 1 tablet (325 mg total) by mouth daily with breakfast.  6   ??? furosemide (LASIX) 40 MG tablet Take 40 mg by mouth Two (2) times a day.     ??? gabapentin (NEURONTIN) 600 MG tablet TAKE 1 TABLET BY MOUTH 3 TIMES DAILY (Patient taking differently: 800 mg. ) 270 tablet 3 ??? halobetasol (ULTRAVATE) 0.05 % ointment APPLY TO AFFECTED AREA 2 TIMES DAILY AS NEEDED. AVOID FACE AND folds. 50 g 1   ??? HYDROcodone-acetaminophen (NORCO 10-325) 10-325 mg per tablet Take 1 tablet by mouth once as needed.      ??? nebivolol (BYSTOLIC) 10 MG tablet Take 10 mg by mouth daily.      ??? OXcarbazepine (TRILEPTAL) 150 MG tablet TAKE 1 TABLET BY MOUTH 2 TIMES DAILY  0   ??? pantoprazole (PROTONIX) 40 MG tablet Take 40 mg by mouth Two (2) times a day. bid     ??? pentoxifylline (TRENTAL) 400 mg CR tablet TAKE 1 TABLET BY MOUTH 3 TIMES DAILY WITH A MEAL 90 tablet 2   ??? potassium iodide (SSKI) 1 gram/mL solution 5 drops (0.64ml) three times daily in OJ. 237 mL 1   ??? promethazine (PHENERGAN) 25 MG tablet once as needed.      ??? rosuvastatin (CRESTOR) 20 MG tablet TAKE 1 TABLET BY MOUTH EVERY DAY     ??? mupirocin (BACTROBAN) 2 % ointment APPLY TOPICALLY 3 TIMES DAILY UNTIL AREA HEALED (Patient not taking:  Reported on 09/12/2018) 22 g 1     No current facility-administered medications for this visit.        Past Medical History:   Diagnosis Date   ??? Allergic     See other notes   ??? Arthritis 05/16/2001   ??? Bleeding gastric ulcer    ??? Cervical cancer (CMS-HCC)    ??? Depression    ??? Disorder of skin or subcutaneous tissue 2008    Diagnosised in   ??? Eczema 05/16/2014   ??? Fibromyalgia    ??? Hypertension    ??? Morbid obesity (CMS-HCC)    ??? Psoriasis Last 2 years        Immunization History   Administered Date(s) Administered   ??? INFLUENZA TIV (TRI) 49MO+ W/ PRESERV (IM) 03/17/2011   ??? Influenza Vaccine Quad (IIV4 PF) 64mo+ injectable 02/17/2015, 05/17/2015, 02/23/2016, 02/13/2017   ??? Influenza Virus Vaccine, unspecified formulation 02/27/2013, 06/17/2015, 03/14/2016   ??? Influenza Whole 02/15/2010   ??? PNEUMOCOCCAL POLYSACCHARIDE 23 03/17/2011, 01/10/2018   ??? Pneumococcal Conjugate 13-Valent 10/04/2017   ??? TdaP 01/05/2016   ??? Tetanus and diptheria,(adult), adsorbed, 2Lf tetanus toxoid, PF 05/16/2002       PHYSICAL EXAM:  General:   Does not sound to be in distress   Lungs:  No wheezing, coughing, or increased respiratory effort noted   Psych:  Appropriate interaction       Record Review: Available records were reviewed, including pertinent office visits, labs, and imaging.      ASSESSMENT/PLAN: Briefly, 48 y.o. female with LE swelling and redness attributed to either cutaneous PAN vs erythema induratum/nodular vasculitis as well as psoriasis on Humira weekly, potassium iodide, and pentoxifylline. Now off prednisone for almost a year. Seems stable. Continue current therapy. Advised patient to have recent [facility] labs sent to review. She does not need any refills today with me. Follow-up with me in 6 months.      Diagnosis ICD-10-CM Associated Orders   1. Cutaneous polyarteritis nodosa (CMS-HCC) M30.0    2. High risk medication use Z79.899      I spent 16 minutes on the phone with the patient. I spent an additional 10 minutes on pre- and post-visit activities.     The patient was physically located in [place] or a state in which I am permitted to provide care. The patient understood that s/he may incur co-pays and cost sharing, and agreed to the telemedicine visit. The visit was completed via phone and/or video, which was appropriate and reasonable under the circumstances given the patient's presentation at the time.    The patient has been advised of the potential risks and limitations of this mode of treatment (including, but not limited to, the absence of in-person examination) and has agreed to be treated using telemedicine. The patient's/patient's family's questions regarding telemedicine have been answered.     If the phone/video visit was completed in an ambulatory setting, the patient has also been advised to contact their provider???s office for worsening conditions, and seek emergency medical treatment and/or call 911 if the patient deems either necessary.      Tawonna Esquer C. Scarlette Calico, MD, PhD  Assistant Professor of Medicine  Department of Medicine/Division of Rheumatology  [facility]  12:17 PM

## 2018-09-13 ENCOUNTER — Other Ambulatory Visit: Payer: Self-pay | Admitting: Family Medicine

## 2018-09-13 MED ORDER — HYDROCODONE-ACETAMINOPHEN 10-325 MG PO TABS: 1.0000 | ORAL_TABLET | Freq: Four times a day (QID) | ORAL | 0 refills | Status: DC | PRN

## 2018-09-13 NOTE — Telephone Encounter (Signed)
Last OV 06/13/18 Hydrocodone last filled 08/20/18 #90 with 0

## 2018-09-16 ENCOUNTER — Other Ambulatory Visit: Payer: Self-pay | Admitting: Family Medicine

## 2018-09-16 ENCOUNTER — Telehealth: Payer: Medicare Other | Admitting: Family

## 2018-09-16 DIAGNOSIS — I890 Lymphedema, not elsewhere classified: Secondary | ICD-10-CM

## 2018-09-16 NOTE — Progress Notes (Signed)
Based on what you shared with me, I feel your condition warrants further evaluation and I recommend that you be seen for a face to face office visit.  I am sorry, but we do not treat lymphedema through an Evisit. This is best managed my your primary care provider. Please call their office today tomorrow and update them on your symptoms.    NOTE: If you entered your credit card information for this eVisit, you will not be charged. You may see a "hold" on your card for the $35 but that hold will drop off and you will not have a charge processed.  If you are having a true medical emergency please call 911.  If you need an urgent face to face visit,  has four urgent care centers for your convenience.    PLEASE NOTE: THE [facility] LOCATIONS AND URGENT CARE CLINICS DO NOT HAVE THE TESTING FOR CORONAVIRUS COVID19 AVAILABLE.  IF YOU FEEL YOU NEED THIS TEST YOU MUST GO TO A TRIAGE LOCATION AT [facility]   [email or web address] to reserve your spot online an avoid wait times  [facility] [address] [place], [place] [address] Modified hours of operation: Monday-Friday, 12 PM to 6 PM  Saturday & Sunday 10 AM to 4 PM *Across the street from [place] (New Address!) [address], [place], [place] [address] *Just off [address], across the road from [address] hours of operation: Monday-Friday, 12 PM to 6 PM  Closed Saturday & Sunday  [facility]'s modified hours of operation will be in effect from May 1 until May 31   The following sites will take your insurance:  . [facility] [place] a Provider at this Location  [address] [place], [place] [address] . 10 am to 8 pm Monday-Friday . 12 pm to 8 pm Saturday-Sunday   . [facility] [place] a Provider at this Location  [address] [place], [place], [place] [address] . 8 am to 8 pm Monday-Friday . 9 am to 6 pm Saturday . 11 am to 6 pm Sunday   . [facility] [place] Get Driving Directions  [address] [place], [place] [address] . 8 am to 8 pm Monday-Friday . 8 am to 4 pm Saturday-Sunday   Your e-visit answers were reviewed by a board certified advanced clinical practitioner to complete your personal care plan.  Thank you for using e-Visits.

## 2018-09-17 ENCOUNTER — Other Ambulatory Visit: Payer: Self-pay | Admitting: Family Medicine

## 2018-09-17 ENCOUNTER — Encounter: Payer: Self-pay | Admitting: Family Medicine

## 2018-09-17 NOTE — Telephone Encounter (Signed)
Last refill:05/07/18 #90, 3 Last OV:06/13/18

## 2018-09-18 ENCOUNTER — Other Ambulatory Visit: Payer: Self-pay

## 2018-09-18 ENCOUNTER — Ambulatory Visit (INDEPENDENT_AMBULATORY_CARE_PROVIDER_SITE_OTHER): Payer: Medicare Other | Admitting: Family Medicine

## 2018-09-18 ENCOUNTER — Encounter: Payer: Self-pay | Admitting: Family Medicine

## 2018-09-18 VITALS — HR 82 | Temp 98.1°F | Ht 63.0 in | Wt 326.0 lb

## 2018-09-18 DIAGNOSIS — R600 Localized edema: Secondary | ICD-10-CM

## 2018-09-18 NOTE — Progress Notes (Signed)
Virtual Visit via Video   I connected with patient on 09/18/18 at  8:20 AM EDT by a video enabled telemedicine application and verified that I am speaking with the correct person using two identifiers.  Location patient: Home Location provider: [facility], Office Persons participating in the virtual visit: Patient, Provider, [place] ([place])  I discussed the limitations of evaluation and management by telemedicine and the availability of in person appointments. The patient expressed understanding and agreed to proceed.  Subjective:   HPI:   Leg Swelling- pt had quite a bit of swelling this weekend.  Leg was red and weeping, 'Sunday it got real hot'.  Did an Counselling psychologist and was told to go to [facility].  Was told that they were unable to treat and to call PCP.  Today has mild swelling, it's no longer hot.  No pain today.  ROS:   See pertinent positives and negatives per HPI.  Patient Active Problem List   Diagnosis Date Noted  . Urinary urgency 04/04/2018  . Chronic narcotic use 09/07/2016  . Acute blood loss anemia 01/14/2016  . Multiple gastric ulcers   . PAN (polyarteritis nodosa) ([place]) 11/24/2015  . Chronic gout of multiple sites 02/25/2014  . GERD (gastroesophageal reflux disease) 01/08/2014  . Routine general medical examination at a health care facility 04/23/2013  . Bariatric surgery status 10/31/2012  . Psoriasis 01/09/2012  . DDD (degenerative disc disease), lumbar 11/30/2011  . Migraine headache   . Hot flashes 12/15/2010  . [name] (obstructive sleep apnea) 11/16/2010  . HYPERGLYCEMIA, FASTING 10/08/2009  . CONTRACTURE OF TENDON 08/17/2009  . PARESTHESIA, HANDS 12/23/2008  . LEG PAIN, BILATERAL 11/05/2008  . ADVERSE DRUG REACTION 04/03/2008  . PERIPHERAL EDEMA 03/26/2008  . Morbid obesity ([place]) 01/03/2007  . Hyperlipidemia 09/22/2006  . Fibromyalgia 09/20/2006  . ANXIETY 06/28/2006  . Essential hypertension 06/28/2006    Social History   Tobacco  Use  . Smoking status: Never Smoker  . Smokeless tobacco: Never Used  Substance Use Topics  . Alcohol use: No    Current Outpatient Medications:  .  Adalimumab 40 MG/0.8ML PNKT, Inject into the skin., Disp: , Rfl:  .  ALPRAZolam (XANAX) 0.5 MG tablet, TAKE 1 TABLET BY MOUTH 3 TIMES DAILY AS NEEDED FOR ANXIETY OR SLEEP, Disp: 90 tablet, Rfl: 3 .  ARIPiprazole (ABILIFY) 10 MG tablet, Take 10 mg by mouth daily., Disp: , Rfl:  .  Armodafinil 250 MG tablet, Take by mouth., Disp: , Rfl:  .  BIOTIN PO, Take 1 tablet by mouth daily., Disp: , Rfl:  .  BYSTOLIC 10 MG tablet, TAKE 1 TABLET BY MOUTH EVERY DAY, Disp: 90 tablet, Rfl: 0 .  calcium citrate (CALCITRATE - DOSED IN MG ELEMENTAL CALCIUM) 950 MG tablet, Take 1 tablet by mouth daily., Disp: , Rfl:  .  clonazePAM (KLONOPIN) 1 MG tablet, TAKE 1 TABLET BY MOUTH EVERY DAY, Disp: 30 tablet, Rfl: 3 .  cyclobenzaprine (FLEXERIL) 10 MG tablet, Take 1 tablet (10 mg total) by mouth 3 (three) times daily as needed., Disp: 270 tablet, Rfl: 0 .  DULoxetine (CYMBALTA) 60 MG capsule, Take 2 capsules (120 mg total) by mouth daily., Disp: 240 capsule, Rfl: 2 .  FEROSUL 325 (65 Fe) MG tablet, TAKE 1 TABLET BY MOUTH EVERY DAY WITH BREAKFAST, Disp: 30 tablet, Rfl: 6 .  furosemide (LASIX) 40 MG tablet, TAKE 2 AND 1/2 TABLETS BY MOUTH EVERY DAY, Disp: 225 tablet, Rfl: 0 .  gabapentin (NEURONTIN) 800 MG tablet, TAKE  1 TABLET BY MOUTH 3 TIMES DAILY, Disp: 90 tablet, Rfl: 1 .  HYDROcodone-acetaminophen (NORCO) 10-325 MG tablet, Take 1 tablet by mouth every 6 (six) hours as needed., Disp: 90 tablet, Rfl: 0 .  meclizine (ANTIVERT) 25 MG tablet, Take 1 tablet (25 mg total) by mouth 3 (three) times daily as needed for dizziness., Disp: 30 tablet, Rfl: 0 .  OXcarbazepine (TRILEPTAL) 150 MG tablet, Take 150 mg by mouth 2 (two) times daily., Disp: , Rfl: 0 .  pantoprazole (PROTONIX) 40 MG tablet, TAKE 1 TABLET BY MOUTH 2 TIMES DAILY, Disp: 180 tablet, Rfl: 1 .  pentoxifylline  (TRENTAL) 400 MG CR tablet, TAKE 1 TABLET BY MOUTH 3 TIMES DAILY WITH A MEAL, Disp: , Rfl: 11 .  potassium iodide (SSKI) 1 GM/ML solution, Take 25 mg 3 (three) times daily by mouth., Disp: , Rfl:  .  promethazine (PHENERGAN) 25 MG tablet, TAKE 1 TABLET BY MOUTH EVERY 6 HOURS AS NEEDED FOR NAUSEA AND VOMITING, Disp: 45 tablet, Rfl: 3 .  rosuvastatin (CRESTOR) 20 MG tablet, TAKE 1 TABLET BY MOUTH EVERY DAY, Disp: 90 tablet, Rfl: 1  Allergies  Allergen Reactions  . Niacin Other (See Comments)    REACTION: Swelling, problems breathing  . Sulfamethoxazole-Trimethoprim Other (See Comments)    REACTION: Rash, throat closed, eyes swelled.  . Aspirin Hives  . Bactrim Hives  . Benzoin Compound Rash  . Cephalexin Hives  . Clindamycin/Lincomycin Hives  . Doxycycline Hives  . Iohexol Hives    Pt treated with PO benedryl  . Lisinopril Hives  . Naproxen Hives  . Pnu-Imune [Pneumococcal Polysaccharide Vaccine] Rash  . Sulfonamide Derivatives Rash    Objective:   Pulse 82   Temp 98.1 F (36.7 C)   Ht 5\' 3"  (1.6 m)   Wt (!) 326 lb (147.9 kg)   SpO2 98%   BMI 57.75 kg/m   AAOx3, NAD NCAT, EOMI No obvious CN deficits Edentulous Morbidly obese Coloring WNL Pt is able to speak clearly, coherently without shortness of breath or increased work of breathing.  Thought process is linear.  Mood is appropriate.   Assessment and Plan:   Leg edema- ongoing.  It sounds as if pt's sxs have almost completely resolved w/o intervention.  Now that there is no longer redness or warmth, will hold off on abx (given that pt has multiple abx allergies).  Encouraged supportive care- elevation, compression- and she will notify me if sxs return.   [name], MD 09/18/2018

## 2018-09-18 NOTE — Telephone Encounter (Signed)
Ok to fill pt nystatin powder?

## 2018-09-18 NOTE — Progress Notes (Signed)
I have discussed the procedure for the virtual visit with the patient who has given consent to proceed with assessment and treatment.   BETHANY DILLARD, CMA     

## 2018-09-24 MED FILL — HUMIRA PEN 40 MG/0.8 ML SUBCUTANEOUS KIT: SUBCUTANEOUS | 28 days supply | Qty: 4 | Fill #8

## 2018-09-24 MED FILL — HUMIRA PEN 40 MG/0.8 ML SUBCUTANEOUS KIT: 28 days supply | Qty: 4 | Fill #8 | Status: AC

## 2018-09-28 ENCOUNTER — Encounter: Payer: Self-pay | Admitting: Family Medicine

## 2018-10-01 ENCOUNTER — Other Ambulatory Visit: Payer: Medicare Other

## 2018-10-01 ENCOUNTER — Encounter: Payer: Self-pay | Admitting: Family Medicine

## 2018-10-02 ENCOUNTER — Encounter: Payer: Self-pay | Admitting: Family Medicine

## 2018-10-03 ENCOUNTER — Ambulatory Visit: Payer: Medicare Other | Admitting: Gynecology

## 2018-10-03 ENCOUNTER — Other Ambulatory Visit: Payer: Medicare Other

## 2018-10-04 ENCOUNTER — Other Ambulatory Visit: Payer: Medicare Other

## 2018-10-04 ENCOUNTER — Ambulatory Visit: Payer: Medicare Other | Admitting: Gynecology

## 2018-10-05 ENCOUNTER — Ambulatory Visit: Payer: Medicare Other

## 2018-10-09 ENCOUNTER — Other Ambulatory Visit: Payer: Self-pay | Admitting: Family Medicine

## 2018-10-09 NOTE — Telephone Encounter (Signed)
Last Filled: 09/13/2018 #90, 0 Last OV:  09/18/2018 (ACUTE), 05/11/2018 (Routine)

## 2018-10-10 ENCOUNTER — Other Ambulatory Visit: Payer: Medicare Other

## 2018-10-10 MED ORDER — HYDROCODONE-ACETAMINOPHEN 10-325 MG PO TABS: 1.0000 | ORAL_TABLET | Freq: Four times a day (QID) | ORAL | 0 refills | Status: DC | PRN

## 2018-10-20 ENCOUNTER — Other Ambulatory Visit: Payer: Self-pay | Admitting: Family Medicine

## 2018-10-23 ENCOUNTER — Ambulatory Visit
Admission: RE | Admit: 2018-10-23 | Discharge: 2018-10-23 | Disposition: A | Discharge status: Home / Self Care | Payer: Medicare Other | Source: Ambulatory Visit | Attending: Nurse Practitioner | Admitting: Nurse Practitioner

## 2018-10-23 DIAGNOSIS — K838 Other specified diseases of biliary tract: Secondary | ICD-10-CM | POA: Diagnosis not present

## 2018-10-23 DIAGNOSIS — K760 Fatty (change of) liver, not elsewhere classified: Secondary | ICD-10-CM

## 2018-10-23 DIAGNOSIS — Z9049 Acquired absence of other specified parts of digestive tract: Secondary | ICD-10-CM | POA: Diagnosis not present

## 2018-10-29 ENCOUNTER — Encounter: Admit: 2018-10-29 | Discharge: 2018-10-30 | Payer: MEDICARE | Attending: Pediatrics | Primary: Pediatrics

## 2018-10-29 DIAGNOSIS — Z889 Allergy status to unspecified drugs, medicaments and biological substances status: Principal | ICD-10-CM

## 2018-10-29 MED FILL — HUMIRA PEN 40 MG/0.8 ML SUBCUTANEOUS KIT: 28 days supply | Qty: 4 | Fill #9 | Status: AC

## 2018-10-29 MED FILL — HUMIRA PEN 40 MG/0.8 ML SUBCUTANEOUS KIT: SUBCUTANEOUS | 28 days supply | Qty: 4 | Fill #9

## 2018-10-29 NOTE — Unmapped (Deleted)
ALLERGY / IMMUNOLOGY - NEW PATIENT VISIT  October 29, 2018 1:14 PM    Referring Physician:    Harl Favor, MD  [address]  [address]  [place], [place] [address]    Reason for Referral:      ASSESSMENT AND PLAN  Assessment:    Autumn Mcdonald is a 48 y.o. female that was seen in consultation at the request of Dr. Harl Favor for the evaluation of       ICD-10-CM   1. Drug allergy Z88.9        cox mediated without CIU. tetracyclines with dress.   avoid bactrim. doxy allergy rare, usualy drug specific.  ASA/naproxen- type 3 cox mediated. can get if needed, but desens not likelty to benefit for urticaria only. could trial these with concurren oral anithistamine bc no anaphylxis risk   discussed side chains R1 and R2 (avoid amp and ceclor). could offer challenge is      Discharge Medications:    Follow-up:  Return if symptoms worsen or fail to improve.     I spent 45 minutes on the audio/video with the patient. I spent an additional 15 minutes on pre- and post-visit activities.     The patient was physically located in [place] or a state in which I am permitted to provide care. The patient and/or parent/gauardian understood that s/he may incur co-pays and cost sharing, and agreed to the telemedicine visit. The visit was completed via phone and/or video, which was appropriate and reasonable under the circumstances given the patient's presentation at the time.    The patient and/or parent/guardian has been advised of the potential risks and limitations of this mode of treatment (including, but not limited to, the absence of in-person examination) and has agreed to be treated using telemedicine. The patient's/patient's family's questions regarding telemedicine have been answered.     If the phone/video visit was completed in an ambulatory setting, the patient and/or parent/guardian has also been advised to contact their provider???s office for worsening conditions, and seek emergency medical treatment and/or call 911 if the patient deems either necessary.    This patient was seen and discussed with Dr. Elisabeth Most with whom the plan was jointly formulated.      Halden Phegley L. Thelma Barge, MD  Fellow, [facility] Allergy/Immunology    Subjective:          HISTORY OF PRESENT ILLNESS:  I had the pleasure of seeing Autumn Mcdonald, a 48 y.o. female seen in consultation at the request of Dr. Harl Favor for further evaluation of numerous antibiotic and medication allergies. Ms. Haser has history of erythema induratum/nodular vasculitis that initially presented with concern for recurrent cellulitis.    Doxycycline: in 2006 she developed hives while taking, treated with Benadryl did not need additional treatment.    Lisinopril- cough with this.     Sulfa/bactrim  Keflex: rash/itchy, al over her body, bumpy/itchy, went to hospital. After 2010. Happened after she had been taking for a whle    Iohexol  Aspirin- gets a rash with higher doses. Same with naproxen. No rhinorrhea or SOB. Swells on skin.      History dates back to 70.   Numerous Abx for cellulitis  Developed lip/face swelling.   Later learned that it was erythema induratum/nodular vasculitis    Bactrim caused face and throat swelling. Happened around July 2010. Into the course.    Tolerated azith and pcn.  No overnight admits.  Denies hives otherwise.    Takes benadryl for stuffy nose, has not tried other antihistamines consistently.    Ulcer bleed with ibuprogen., needed to get blood. 2017      Past Medical / Surgical History  Past Medical History:   Diagnosis Date   ??? Allergic     See other notes   ??? Arthritis 05/16/2001   ??? Bleeding gastric ulcer    ??? Cervical cancer (CMS-HCC)    ??? Depression    ??? Disorder of skin or subcutaneous tissue 2008    Diagnosised in   ??? Eczema 05/16/2014   ??? Fibromyalgia    ??? Hypertension    ??? Morbid obesity (CMS-HCC)    ??? Psoriasis Last 2 years   Humira for psoriasis  [name]  Fibromyalgia  Arthritis  Hyperlipidemia  Hypertension    Past Surgical History: Procedure Laterality Date   ??? CESAREAN SECTION  1994   ??? CHOLECYSTECTOMY  March 1994   ??? GASTRIC BYPASS  January 2014   ??? HYSTERECTOMY  June 1998   ??? SKIN BIOPSY  2017       Medications    Current Outpatient Medications:   ???  adalimumab (HUMIRA) 40 mg/0.8 mL subcutaneous pen kit, Inject the contents of 1 pen (40 mg total) under the skin once a week., Disp: 4 each, Rfl: 32  ???  ALPRAZolam (XANAX) 0.5 MG tablet, TAKE 1 TABLET BY MOUTH 3 TIMES DAILY AS NEEDED FOR ANXIETY OR SLEEP, Disp: , Rfl:   ???  ARIPiprazole (ABILIFY) 10 MG tablet, Take 10 mg by mouth daily., Disp: , Rfl:   ???  armodafinil (NUVIGIL) 250 mg tablet, Take 250 mg by mouth three (3) times a day. , Disp: , Rfl:   ???  clonazePAM (KLONOPIN) 1 MG tablet, TAKE 1 TABLET BY MOUTH EVERY DAY, Disp: , Rfl:   ???  compr.stocking,knee,long,x-lrg Misc, 1 each by Miscellaneous route daily., Disp: 2 each, Rfl: 0  ???  cyclobenzaprine (FLEXERIL) 10 MG tablet, Take 10 mg by mouth once as needed. , Disp: , Rfl:   ???  diclofenac sodium (VOLTAREN) 1 % gel, Apply 2 g topically Four (4) times a day. (Patient taking differently: Apply 2 g topically once as needed. ), Disp: 100 g, Rfl: 6  ???  DULoxetine (CYMBALTA) 60 MG capsule, Take 60 mg by mouth daily. 120 mg daily, Disp: , Rfl:   ???  ferrous sulfate 325 (65 FE) MG tablet, Take 1 tablet (325 mg total) by mouth daily with breakfast., Disp: , Rfl: 6  ???  furosemide (LASIX) 40 MG tablet, Take 40 mg by mouth Two (2) times a day., Disp: , Rfl:   ???  gabapentin (NEURONTIN) 600 MG tablet, TAKE 1 TABLET BY MOUTH 3 TIMES DAILY (Patient taking differently: 800 mg. ), Disp: 270 tablet, Rfl: 3  ???  halobetasol (ULTRAVATE) 0.05 % ointment, APPLY TO AFFECTED AREA 2 TIMES DAILY AS NEEDED. AVOID FACE AND folds., Disp: 50 g, Rfl: 1  ???  HYDROcodone-acetaminophen (NORCO 10-325) 10-325 mg per tablet, Take 1 tablet by mouth once as needed. , Disp: , Rfl:   ???  nebivolol (BYSTOLIC) 10 MG tablet, Take 10 mg by mouth daily. , Disp: , Rfl:   ???  OXcarbazepine (TRILEPTAL) 150 MG tablet, TAKE 1 TABLET BY MOUTH 2 TIMES DAILY, Disp: , Rfl: 0  ???  pantoprazole (PROTONIX) 40 MG tablet, Take 40 mg by mouth Two (2) times a day. bid, Disp: , Rfl:   ???  pentoxifylline (TRENTAL) 400  mg CR tablet, TAKE 1 TABLET BY MOUTH 3 TIMES DAILY WITH A MEAL, Disp: 90 tablet, Rfl: 2  ???  potassium iodide (SSKI) 1 gram/mL solution, 5 drops (0.6ml) three times daily in OJ., Disp: 237 mL, Rfl: 1  ???  promethazine (PHENERGAN) 25 MG tablet, once as needed. , Disp: , Rfl:   ???  rosuvastatin (CRESTOR) 20 MG tablet, TAKE 1 TABLET BY MOUTH EVERY DAY, Disp: , Rfl:     Allergies  Allergies   Allergen Reactions   ??? Iohexol Hives     Pt treated with PO benedryl   ??? Keflex [Cephalexin] Shortness Of Breath and Hives   ??? Lincomycin Hcl Hives   ??? Lisinopril Hives   ??? Sulfa (Sulfonamide Antibiotics) Swelling and Rash   ??? Sulfamethoxazole-Trimethoprim Hives     REACTION: Rash, throat closed, eyes swelled.   ??? Aspirin      REACTION: Hives   ??? Doxycycline      REACTION: Hives   ??? Naproxen      REACTION: Hives   ??? Niacin      REACTION: Swelling, problems breathing   ??? Benzoin Compound Rash   ??? Pneumococcal 23-Val Ps Vaccine Rash     cellulitis       Family History  Family History   Problem Relation Age of Onset   ??? Arthritis Mother    ??? Diabetes Mother    ??? Depression Mother    ??? Cancer Mother         skin cancer type unknown   ??? Melanoma Mother    ??? Heart disease Father    ??? Cancer Father         prostate cancer   ??? Melanoma Father         My dad has had a heart bypass and has prostrate cancer   ??? Cancer Maternal Grandmother         breast cancer/Mothers mom passed with breast cancer   ??? Basal cell carcinoma Neg Hx    ??? Squamous cell carcinoma Neg Hx    Allergic rhinitis in mother    Social and environmental history:  Social History     Tobacco Use   ??? Smoking status: Never Smoker   ??? Smokeless tobacco: Never Used   ??? Tobacco comment: None   Substance Use Topics   ??? Alcohol use: No     Alcohol/week: 0.0 standard drinks ??? Drug use: No     Social History     Social History Narrative   ??? Not on file   Lives with parents, 1 teacup poodle. On disability since 2014. Non smoker    REVIEW OF SYSTEMS:   The balance of 14 systems was reviewed and found to be normal except as noted in the history of present illness or past medical history.    Objective:            PHYSICAL EXAM:    There were no vitals filed for this visit.    Constitutional:  Well nourished, well-appearing female, appears stated age in no acute distress. There is no height or weight on file to calculate BMI..  Skin: Well-hydrated and free of rash.  Head: Normal facies.  Hematologic/lymphatic/immunologic: no cervical, submandibular, periauricular, or supraclavicular lymphadenopathy.  Eyes: No scleral icterus or conjunctival injection. EOMI.  ENMT: Tympanic membranes are translucent with normal landmarks; nares are patent with no drainage, nasal turbinates are moderately edematous  Cardiovascular: Regular rate and rhythm with no murmurs, gallops, or  rubs. Extremities are warm and well perfused without edema.  Respiratory: Clear to auscultation bilaterally with no wheezes.  Gastrointestinal: Abdomen is nondistended.  Genitourinary: did not examine.  Musculoskeletal: No deformities.  Neurologic: Normal gait. Cranial nerves grossly intact.  Psychiatric: Relaxed, friendly, and cooperative with interview and examination. Euthymic affect. Normal thought process and thought content.    TESTING:  Skin testing  Skin testing independently visualized and interpreted and is pertinent for the following:    Laboratory testing  Laboratory testing independently visualized & interpreted and is pertinent for the following:    Spirometry  Spirometry independently visualized & interpreted and is pertinent for the following:    Imaging:  Imaging reviewed and pertinent for the following:

## 2018-10-29 NOTE — Unmapped (Signed)
The [facility] has made a third and final attempt to reach this patient to refill the following medication:Humira.      We have left voicemails on the following phone numbers: [phone].    Dates contacted: 6/1 6/9 and 6/15  Last scheduled delivery: 5/12    The patient may be at risk of non-compliance with this medication. The patient should call the [facility] at [phone] (option 4) to refill medication.    Haunani Dickard D Administrator Shared [facility] Specialty Technician

## 2018-10-29 NOTE — Unmapped (Signed)
[facility] Refill Coordination Note    Specialty Medication(s) to be Shipped:   Inflammatory Disorders: Humira    Other medication(s) to be shipped: none     Autumn Mcdonald, DOB: [birth year]  Phone: [phone] (home)       All above HIPAA information was verified with patient.     Completed refill call assessment today to schedule patient's medication shipment from the [facility] [phone]).       Specialty medication(s) and dose(s) confirmed: Regimen is correct and unchanged.   Changes to medications: Autumn Mcdonald reports no changes at this time.  Changes to insurance: No  Questions for the pharmacist: No    Confirmed patient received Welcome Packet with first shipment. The patient will receive a drug information handout for each medication shipped and additional FDA Medication Guides as required.       DISEASE/MEDICATION-SPECIFIC INFORMATION        N/A    SPECIALTY MEDICATION ADHERENCE     Medication Adherence    Patient reported X missed doses in the last month:  0              Humira: 3 pens on hand      SHIPPING     Shipping address confirmed in Epic.     Delivery Scheduled: Yes, Expected medication delivery date: 10/29/18.     Medication will be delivered via UPS to the home address in Epic WAM.    Autumn Mcdonald   [facility] Shared [facility] Specialty Technician

## 2018-11-01 ENCOUNTER — Other Ambulatory Visit: Payer: Self-pay | Admitting: Nurse Practitioner

## 2018-11-01 DIAGNOSIS — K838 Other specified diseases of biliary tract: Secondary | ICD-10-CM

## 2018-11-03 ENCOUNTER — Other Ambulatory Visit: Payer: Self-pay | Admitting: Family Medicine

## 2018-11-04 ENCOUNTER — Telehealth: Payer: Medicare Other | Admitting: Physician Assistant

## 2018-11-04 DIAGNOSIS — F430 Acute stress reaction: Secondary | ICD-10-CM

## 2018-11-04 NOTE — Progress Notes (Signed)
Based on what you shared with me, I feel your condition warrants further evaluation and I recommend that you be seen for a face to face office visit. This is not something we can treat via e-visits. You will need to contact your primary care office for management or be seen at a local urgent care. I am so sorry that you are going through such a hard time. I will send a copy of this e-visit to Dr.[name] so that she can review tomorrow when back in office and hopefully get you taken care of.  NOTE: If you entered your credit card information for this eVisit, you will not be charged. You may see a "hold" on your card for the $35 but that hold will drop off and you will not have a charge processed.  If you are having a true medical emergency please call 911.     For an urgent face to face visit, [place] has five urgent care centers for your convenience:    [email or web address] to reserve your spot online an avoid wait times  [facility] [address] [place], [place] [address] Modified hours of operation: Monday-Friday, 12 PM to 6 PM  Closed Saturday & Sunday  *Across the street from [place] (New Address!) [address] [place], [place] [address] *Just off [address], across the road from [place] hours of operation: Monday-Friday, 12 PM to 6 PM  Closed Saturday & Sunday   The following sites will take your insurance:  . [facility]    [phone]                  Get Driving Directions  [address], [place] [address] . 10 am to 8 pm Monday-Friday . 12 pm to 8 pm Saturday-Sunday   . [facility] at [place]                  Get Driving Directions  [address], [place], [place] [address] . 8 am to 8 pm Monday-Friday . 9 am to 6 pm Saturday . 11 am to 6 pm Sunday   . [facility] at  [place]                  Get Driving Directions   [address] [place] [address] . 8 am to 8 pm Monday-Friday . 8 am to 4 pm Saturday-Sunday    . [facility] at [place]                    Get Driving Directions  [phone]  [address] [place], [place] [address]  . Monday-Friday, 12 PM to 6 PM    Your e-visit answers were reviewed by a board certified advanced clinical practitioner to complete your personal care plan.  Thank you for using e-Visits.

## 2018-11-05 ENCOUNTER — Ambulatory Visit (INDEPENDENT_AMBULATORY_CARE_PROVIDER_SITE_OTHER): Payer: Medicare Other | Admitting: Family Medicine

## 2018-11-05 ENCOUNTER — Encounter: Payer: Self-pay | Admitting: Family Medicine

## 2018-11-05 ENCOUNTER — Other Ambulatory Visit: Payer: Self-pay

## 2018-11-05 ENCOUNTER — Telehealth: Payer: Self-pay | Admitting: *Deleted

## 2018-11-05 DIAGNOSIS — F411 Generalized anxiety disorder: Secondary | ICD-10-CM | POA: Diagnosis not present

## 2018-11-05 MED ORDER — BUSPIRONE HCL 7.5 MG PO TABS: 7.5000 mg | ORAL_TABLET | Freq: Two times a day (BID) | ORAL | 3 refills | Status: DC

## 2018-11-05 NOTE — Progress Notes (Signed)
I have discussed the procedure for the virtual visit with the patient who has given consent to proceed with assessment and treatment.   Pt unable to obtain vitals.   Jessica L Brodmerkel, CMA     

## 2018-11-05 NOTE — Progress Notes (Signed)
Virtual Visit via Video   I connected with patient on 11/05/18 at  2:00 PM EDT by a video enabled telemedicine application and verified that I am speaking with the correct person using two identifiers.  Location patient: Home Location provider: Acupuncturist, Office Persons participating in the virtual visit: Patient, Provider, [place] (Jess B)  I discussed the limitations of evaluation and management by telemedicine and the availability of in person appointments. The patient expressed understanding and agreed to proceed.  Subjective:   HPI:   Anxiety- mom is in process of dying.  Pt's mom told her this weekend that she was going to 'see her mom in heaven'.  [place] Mylar 'she was completely out of it' but 'today she's good'.  Pt has talked to Hospice about starting counseling.  Has not yet scheduled but plans to.  Pt is worried about both mom and dad- dad is 'being a jerk' b/c he doesn't know how to deal with this.  ROS:   See pertinent positives and negatives per HPI.  Patient Active Problem List   Diagnosis Date Noted  . Urinary urgency 04/04/2018  . Chronic narcotic use 09/07/2016  . Acute blood loss anemia 01/14/2016  . Multiple gastric ulcers   . PAN (polyarteritis nodosa) ([place]) 11/24/2015  . Chronic gout of multiple sites 02/25/2014  . GERD (gastroesophageal reflux disease) 01/08/2014  . Routine general medical examination at a health care facility 04/23/2013  . Bariatric surgery status 10/31/2012  . Psoriasis 01/09/2012  . DDD (degenerative disc disease), lumbar 11/30/2011  . Migraine headache   . Hot flashes 12/15/2010  . [name] (obstructive sleep apnea) 11/16/2010  . HYPERGLYCEMIA, FASTING 10/08/2009  . CONTRACTURE OF TENDON 08/17/2009  . PARESTHESIA, HANDS 12/23/2008  . LEG PAIN, BILATERAL 11/05/2008  . ADVERSE DRUG REACTION 04/03/2008  . PERIPHERAL EDEMA 03/26/2008  . Morbid obesity ([place]) 01/03/2007  . Hyperlipidemia 09/22/2006  . Fibromyalgia 09/20/2006  .  ANXIETY 06/28/2006  . Essential hypertension 06/28/2006    Social History   Tobacco Use  . Smoking status: Never Smoker  . Smokeless tobacco: Never Used  Substance Use Topics  . Alcohol use: No    Current Outpatient Medications:  .  Adalimumab 40 MG/0.8ML PNKT, Inject into the skin., Disp: , Rfl:  .  ALPRAZolam (XANAX) 0.5 MG tablet, TAKE 1 TABLET BY MOUTH 3 TIMES DAILY AS NEEDED FOR ANXIETY OR SLEEP, Disp: 90 tablet, Rfl: 3 .  ARIPiprazole (ABILIFY) 10 MG tablet, Take 10 mg by mouth daily., Disp: , Rfl:  .  Armodafinil 250 MG tablet, Take by mouth., Disp: , Rfl:  .  BIOTIN PO, Take 1 tablet by mouth daily., Disp: , Rfl:  .  BYSTOLIC 10 MG tablet, TAKE 1 TABLET BY MOUTH EVERY DAY, Disp: 90 tablet, Rfl: 0 .  calcium citrate (CALCITRATE - DOSED IN MG ELEMENTAL CALCIUM) 950 MG tablet, Take 1 tablet by mouth daily., Disp: , Rfl:  .  clonazePAM (KLONOPIN) 1 MG tablet, TAKE 1 TABLET BY MOUTH EVERY DAY, Disp: 30 tablet, Rfl: 3 .  cyclobenzaprine (FLEXERIL) 10 MG tablet, Take 1 tablet (10 mg total) by mouth 3 (three) times daily as needed., Disp: 270 tablet, Rfl: 0 .  DULoxetine (CYMBALTA) 60 MG capsule, Take 2 capsules (120 mg total) by mouth daily., Disp: 240 capsule, Rfl: 2 .  FEROSUL 325 (65 Fe) MG tablet, TAKE 1 TABLET BY MOUTH EVERY DAY WITH BREAKFAST, Disp: 30 tablet, Rfl: 6 .  furosemide (LASIX) 40 MG tablet, TAKE 2 AND 1/2 TABLETS  BY MOUTH EVERY DAY, Disp: 225 tablet, Rfl: 0 .  gabapentin (NEURONTIN) 800 MG tablet, TAKE 1 TABLET BY MOUTH 3 TIMES DAILY, Disp: 90 tablet, Rfl: 1 .  HYDROcodone-acetaminophen (NORCO) 10-325 MG tablet, Take 1 tablet by mouth every 6 (six) hours as needed., Disp: 90 tablet, Rfl: 0 .  meclizine (ANTIVERT) 25 MG tablet, Take 1 tablet (25 mg total) by mouth 3 (three) times daily as needed for dizziness., Disp: 30 tablet, Rfl: 0 .  NYSTATIN powder, APPLY 4 grams TO THE AFFECTED AREA 4 TIMES DAILY, Disp: 60 g, Rfl: 3 .  OXcarbazepine (TRILEPTAL) 150 MG tablet, Take  150 mg by mouth 2 (two) times daily., Disp: , Rfl: 0 .  pantoprazole (PROTONIX) 40 MG tablet, TAKE 1 TABLET BY MOUTH 2 TIMES DAILY, Disp: 180 tablet, Rfl: 1 .  pentoxifylline (TRENTAL) 400 MG CR tablet, TAKE 1 TABLET BY MOUTH 3 TIMES DAILY WITH A MEAL, Disp: , Rfl: 11 .  potassium iodide (SSKI) 1 GM/ML solution, Take 25 mg 3 (three) times daily by mouth., Disp: , Rfl:  .  promethazine (PHENERGAN) 25 MG tablet, TAKE 1 TABLET BY MOUTH EVERY 6 HOURS AS NEEDED FOR NAUSEA AND FOR VOMITING, Disp: 45 tablet, Rfl: 3 .  rosuvastatin (CRESTOR) 20 MG tablet, TAKE 1 TABLET BY MOUTH EVERY DAY, Disp: 90 tablet, Rfl: 1  Allergies  Allergen Reactions  . Niacin Other (See Comments)    REACTION: Swelling, problems breathing  . Sulfamethoxazole-Trimethoprim Other (See Comments)    REACTION: Rash, throat closed, eyes swelled.  . Aspirin Hives  . Bactrim Hives  . Benzoin Compound Rash  . Cephalexin Hives  . Clindamycin/Lincomycin Hives  . Doxycycline Hives  . Iohexol Hives    Pt treated with PO benedryl  . Lisinopril Hives  . Naproxen Hives  . Pnu-Imune [Pneumococcal Polysaccharide Vaccine] Rash  . Sulfonamide Derivatives Rash    Objective:   There were no vitals taken for this visit.  AAOx3, NAD NCAT, EOMI No obvious CN deficits Coloring WNL Pt is able to speak clearly, coherently without shortness of breath or increased work of breathing.  Thought process is linear.  Mood is appropriate.   Assessment and Plan:   Anxiety- deteriorated.  Mom is in process of dying which is understandably hard.  Pt is already on Alprazolam, Clonazepam, Abilify, Cymbalta, and hydrocodone.  Based on this, will add Buspar BID.  Encouraged counseling w/ Hospice.  Pt is agreeable.  Will follow closely.   [name], MD 11/05/2018

## 2018-11-05 NOTE — Telephone Encounter (Signed)
Ok to schedule Virtual Visit to discuss

## 2018-11-05 NOTE — Telephone Encounter (Signed)
Pt calling to discuss recent E-Visit.  States that she had an e-visit over the weekend for stress and was told that she would need to follow-up with PCP.   She is stressed/anxious over the issues with her mother being in hospice.  She is asking if there is anything she can be given to help her through this. She is aware that an in-person visit might be necessary, she was just wanting to check with PCP prior to scheduling.   Routing to PCP to advise.

## 2018-11-05 NOTE — Telephone Encounter (Signed)
Please schedule VV per PCP

## 2018-11-05 NOTE — Telephone Encounter (Signed)
Patient has been scheduled for a virtual visit this afternoon.

## 2018-11-06 ENCOUNTER — Other Ambulatory Visit: Payer: Medicare Other

## 2018-11-06 ENCOUNTER — Other Ambulatory Visit: Payer: Self-pay | Admitting: Family Medicine

## 2018-11-07 ENCOUNTER — Ambulatory Visit: Payer: Medicare Other | Admitting: Family Medicine

## 2018-11-07 ENCOUNTER — Other Ambulatory Visit: Payer: Self-pay

## 2018-11-07 MED ORDER — HYDROCODONE-ACETAMINOPHEN 10-325 MG PO TABS: 1.0000 | ORAL_TABLET | Freq: Four times a day (QID) | ORAL | 0 refills | Status: DC | PRN

## 2018-11-07 NOTE — Telephone Encounter (Signed)
Last OV 11/05/18 Hydrocodone last filled 10/10/18 #90 with 0

## 2018-11-08 ENCOUNTER — Ambulatory Visit: Payer: Medicare Other

## 2018-11-08 ENCOUNTER — Ambulatory Visit: Payer: Medicare Other | Admitting: Family Medicine

## 2018-11-08 ENCOUNTER — Other Ambulatory Visit: Payer: Medicare Other

## 2018-11-08 ENCOUNTER — Ambulatory Visit: Payer: Medicare Other | Admitting: Gynecology

## 2018-11-08 NOTE — Unmapped (Signed)
ALLERGY / IMMUNOLOGY - NEW PATIENT VISIT  October 29, 2018 1:14 PM    Referring Physician:    Harl Favor, MD  [address]  [address]  [place], [place] [address]    Reason for Referral:  Drug allergies    ASSESSMENT AND PLAN  Assessment:    Autumn Mcdonald is a 48 y.o. female that was seen in consultation at the request of Dr. Harl Favor for the evaluation of numerous drug allergies.     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 These patients typically tolerate highly selective COX-2 inhibitors. Given her underlying vasculitis it is important to note that desensitization could be considered if she needs daily aspirin treatment, but may not fully prevent the urticaria. She could also try taking the medication with an antihistamine or LTRA if needed.  - Avoid NSAIDs other than celecoxib  - Please contact us if an NSAID is required    Cephalexin: cutaneous symptoms only, delayed days of treatment. While it is unclear whether or not symptoms were IgE mediated, she should avoid cephalexin, as there are numerous other antibiotic choices. Her tolerance of penicillin rules out beta lactam ring allergy.  - Avoid cephalexin  - Avoid amoxicillin, ampicillin, and cephalosporins with similar R1 and R2 side chains: cefadroxil, cefaclor, cefprozil  - Could offer a challenge in the clinic in the future to definitively rule in or out an allergy    Bactrim allergy: history of urticaria and angioedema is concerning for IgE mediated allergy.  - Avoid trimethoprim-sulfamethoxazole  - If needed, recommend densensitization    Doxycycline induced urticaria: unclear if symptoms were IgE mediated and doxycycline allergy is very rare. More common reactions to doxycycline are various dermatologic reactions, including non-specific mild reactions and fixed drug eruptions, ranging to SJS and DRESS, though these are rare. This patient more likely had a nonspecific reaction of her skin. There is very limited data on cross-reactivity between tetracyclines, with most data regarding fixed drug eruptions from tetracycline, making it difficult to estimate her likelihood of tolerating a different tetracycline. (10.3390/[id number])  - Avoid doxycycline and tetracyclines for now  - Would consider in office challenge in the future, as her symptoms suggest non-specific mild reaction.      ICD-10-CM   1. Drug allergy Z88.9        Discharge Medications:    Follow-up:  Return if symptoms worsen or fail to improve.     I spent 45 minutes on the audio/video with the patient. I spent an additional 15 minutes on pre- and post-visit activities.     The patient was physically located in [place] or a state in which I am permitted to provide care. The patient and/or parent/gauardian understood that s/he may incur co-pays and cost sharing, and agreed to the telemedicine visit. The visit was completed via phone and/or video, which was appropriate and reasonable under the circumstances given the patient's presentation at the time.    The patient and/or parent/guardian has been advised of the potential risks and limitations of this mode of treatment (including, but not limited to, the absence of in-person examination) and has agreed to be treated using telemedicine. The patient's/patient's family's questions regarding telemedicine have been answered.     If the phone/video visit was completed in an ambulatory setting, the patient and/or parent/guardian has also been advised  to contact their provider???s office for worsening conditions, and seek emergency medical treatment and/or call 911 if the patient deems either necessary.    This patient was seen and discussed with Dr. Elisabeth Most with whom the plan was jointly formulated.    Sanel Stemmer L. Thelma Barge, MD  Fellow, [facility] Allergy/Immunology    Subjective:          HISTORY OF PRESENT ILLNESS:  I had the pleasure of seeing Autumn Mcdonald, a 48 y.o. female seen in consultation at the request of Dr. Harl Favor for further evaluation of numerous antibiotic and medication allergies. Autumn Mcdonald has history of erythema induratum/nodular vasculitis that initially presented with concern for recurrent cellulitis.    Doxycycline: in 2006 she developed hives while taking, treated with Benadryl did not need additional treatment.    Bactrim: in 2010 she developed face and throat swelling a few days into the course when treated for cellulitis and was treated in an emergency department.    Keflex: in 2010 she developed itchy, pumped rash all over her body days into the course, for which she presented to the hospital.    Aspirin and naproxen: on higher doses she develops urticaria and mild swelling of the skin. She denies rhinorrhea, shortness of breath or other associated symptoms.    Lisinopril: had to stop because she developed cough.    She denies a history of urticaria and angioedema outside of these discrete episodes.     She has mild seasonal rhinitis for which she takes benadryl as needed for the congestion. She has not tried other antihistamines consistently.    Past Medical / Surgical History  Past Medical History:   Diagnosis Date   ??? Allergic     See other notes   ??? Arthritis 05/16/2001   ??? Bleeding gastric ulcer    ??? Cervical cancer (CMS-HCC)    ??? Depression    ??? Disorder of skin or subcutaneous tissue 2008    Diagnosised in   ??? Eczema 05/16/2014   ??? Fibromyalgia    ??? Hypertension    ??? Morbid obesity (CMS-HCC)    ??? Psoriasis Last 2 years   Humira for psoriasis  [name]  Fibromyalgia  Arthritis  Hyperlipidemia  Hypertension    Past Surgical History:   Procedure Laterality Date   ??? CESAREAN SECTION  1994   ??? CHOLECYSTECTOMY  March 1994   ??? GASTRIC BYPASS  January 2014   ??? HYSTERECTOMY  June 1998   ??? SKIN BIOPSY  2017       Medications    Current Outpatient Medications:   ???  adalimumab (HUMIRA) 40 mg/0.8 mL subcutaneous pen kit, Inject the contents of 1 pen (40 mg total) under the skin once a week., Disp: 4 each, Rfl: 32  ???  ALPRAZolam (XANAX) 0.5 MG tablet, TAKE 1 TABLET BY MOUTH 3 TIMES DAILY AS NEEDED FOR ANXIETY OR SLEEP, Disp: , Rfl:   ???  ARIPiprazole (ABILIFY) 10 MG tablet, Take 10 mg by mouth daily., Disp: , Rfl:   ???  armodafinil (NUVIGIL) 250 mg tablet, Take 250 mg by mouth three (3) times a day. , Disp: , Rfl:   ???  clonazePAM (KLONOPIN) 1 MG tablet, TAKE 1 TABLET BY MOUTH EVERY DAY, Disp: , Rfl:   ???  compr.stocking,knee,long,x-lrg Misc, 1 each by Miscellaneous route daily., Disp: 2 each, Rfl: 0  ???  cyclobenzaprine (FLEXERIL) 10 MG tablet, Take 10 mg by mouth once as needed. , Disp: , Rfl:   ???  diclofenac sodium (VOLTAREN) 1 % gel, Apply 2 g topically Four (4) times a day. (Patient taking differently: Apply 2 g topically once as needed. ), Disp: 100 g, Rfl: 6  ???  DULoxetine (CYMBALTA) 60 MG capsule, Take 60 mg by mouth daily. 120 mg daily, Disp: , Rfl:   ???  ferrous sulfate 325 (65 FE) MG tablet, Take 1 tablet (325 mg total) by mouth daily with breakfast., Disp: , Rfl: 6  ???  furosemide (LASIX) 40 MG tablet, Take 40 mg by mouth Two (2) times a day., Disp: , Rfl:   ???  gabapentin (NEURONTIN) 600 MG tablet, TAKE 1 TABLET BY MOUTH 3 TIMES DAILY (Patient taking differently: 800 mg. ), Disp: 270 tablet, Rfl: 3  ???  halobetasol (ULTRAVATE) 0.05 % ointment, APPLY TO AFFECTED AREA 2 TIMES DAILY AS NEEDED. AVOID FACE AND folds., Disp: 50 g, Rfl: 1  ???  HYDROcodone-acetaminophen (NORCO 10-325) 10-325 mg per tablet, Take 1 tablet by mouth once as needed. , Disp: , Rfl:   ???  nebivolol (BYSTOLIC) 10 MG tablet, Take 10 mg by mouth daily. , Disp: , Rfl:   ???  OXcarbazepine (TRILEPTAL) 150 MG tablet, TAKE 1 TABLET BY MOUTH 2 TIMES DAILY, Disp: , Rfl: 0  ???  pantoprazole (PROTONIX) 40 MG tablet, Take 40 mg by mouth Two (2) times a day. bid, Disp: , Rfl:   ???  pentoxifylline (TRENTAL) 400 mg CR tablet, TAKE 1 TABLET BY MOUTH 3 TIMES DAILY WITH A MEAL, Disp: 90 tablet, Rfl: 2  ???  potassium iodide (SSKI) 1 gram/mL solution, 5 drops (0.74ml) three times daily in OJ., Disp: 237 mL, Rfl: 1  ???  promethazine (PHENERGAN) 25 MG tablet, once as needed. , Disp: , Rfl:   ???  rosuvastatin (CRESTOR) 20 MG tablet, TAKE 1 TABLET BY MOUTH EVERY DAY, Disp: , Rfl:     Allergies  Allergies   Allergen Reactions   ??? Iohexol Hives     Pt treated with PO benedryl   ??? Keflex [Cephalexin] Shortness Of Breath and Hives   ??? Lincomycin Hcl Hives   ??? Lisinopril Hives   ??? Sulfa (Sulfonamide Antibiotics) Swelling and Rash   ??? Sulfamethoxazole-Trimethoprim Hives     REACTION: Rash, throat closed, eyes swelled.   ??? Aspirin      REACTION: Hives   ??? Doxycycline      REACTION: Hives   ??? Naproxen      REACTION: Hives   ??? Niacin      REACTION: Swelling, problems breathing   ??? Benzoin Compound Rash   ??? Pneumococcal 23-Val Ps Vaccine Rash     cellulitis       Family History  Family History   Problem Relation Age of Onset   ??? Arthritis Mother    ??? Diabetes Mother    ??? Depression Mother    ??? Cancer Mother         skin cancer type unknown   ??? Melanoma Mother    ??? Heart disease Father    ??? Cancer Father         prostate cancer   ??? Melanoma Father         My dad has had a heart bypass and has prostrate cancer   ??? Cancer Maternal Grandmother         breast cancer/Mothers mom passed with breast cancer   ??? Basal cell carcinoma Neg Hx    ??? Squamous cell carcinoma Neg Hx    Allergic  rhinitis in mother    Social and environmental history:  Social History     Tobacco Use   ??? Smoking status: Never Smoker   ??? Smokeless tobacco: Never Used   ??? Tobacco comment: None   Substance Use Topics   ??? Alcohol use: No     Alcohol/week: 0.0 standard drinks   ??? Drug use: No     Social History     Social History Narrative   ??? Not on file   Lives with parents, 1 teacup poodle. On disability since 2014. Non smoker    REVIEW OF SYSTEMS:   The balance of 14 systems was reviewed and found to be normal except as noted in the history of present illness or past medical history.    Objective:            PHYSICAL EXAM:    There were no vitals filed for this visit.    Deferred given virtual visit.

## 2018-11-09 ENCOUNTER — Other Ambulatory Visit: Payer: Self-pay | Admitting: Nurse Practitioner

## 2018-11-09 DIAGNOSIS — K838 Other specified diseases of biliary tract: Secondary | ICD-10-CM

## 2018-11-13 ENCOUNTER — Other Ambulatory Visit: Payer: Self-pay | Admitting: Family Medicine

## 2018-11-13 NOTE — Telephone Encounter (Signed)
Last oV 11/05/18 Clonazepam last filled 07/16/18 #30 with 3

## 2018-11-16 ENCOUNTER — Telehealth: Payer: Medicare Other | Admitting: Family

## 2018-11-16 DIAGNOSIS — R300 Dysuria: Secondary | ICD-10-CM

## 2018-11-16 DIAGNOSIS — M549 Dorsalgia, unspecified: Secondary | ICD-10-CM

## 2018-11-16 NOTE — Progress Notes (Signed)
Thank you for the details you included in the comment boxes. Those details are very helpful in determining the best course of treatment for you and help us to provide the best care.  Due to your symptoms, we must perform a physical exam and lab work for safety reasons to ensure the infection has not moved up your urinary tract. This is the standard of care and cannot be performed remotely.   Based on what you shared with me, I feel your condition warrants further evaluation and I recommend that you be seen for a face to face office visit.  NOTE: If you entered your credit card information for this eVisit, you will not be charged. You may see a "hold" on your card for the $35 but that hold will drop off and you will not have a charge processed.  If you are having a true medical emergency please call 911.     For an urgent face to face visit, [facility] has five urgent care centers for your convenience:    [email or web address] to reserve your spot online an avoid wait times  [facility] [address], [address] [place], [place] [address] Modified hours of operation: Monday-Friday, 12 PM to 6 PM  Closed Saturday & Sunday  *Across the street from [employer]  [facility] (New Address!) [address], [address] , [place] [address] *Just off [address], across the road from [employer]* Modified hours of operation: Monday-Friday, 12 PM to 6 PM  Closed Saturday & Sunday   The following sites will take your insurance:  . [facility]    [phone]                  Get Driving Directions  [address] [place], [place] [address] . 10 am to 8 pm Monday-Friday . 12 pm to 8 pm Saturday-Sunday   . [facility]  [phone]                  Get Driving Directions  [address], [address] , [place] [address] . 8 am to 8 pm Monday-Friday . 9 am to 6 pm Saturday .  11 am to 6 pm Sunday   . [facility] [place]  [phone]                  Get Driving Directions   [address] [place],  [address] . 8 am to 8 pm Monday-Friday . 8 am to 4 pm Saturday-Sunday    . [facility]                     Get Driving Directions  [phone]  [address] ,  [address]  . Monday-Friday, 12 PM to 6 PM    Your e-visit answers were reviewed by a board certified advanced clinical practitioner to complete your personal care plan.  Thank you for using e-Visits.  

## 2018-11-19 ENCOUNTER — Encounter: Payer: Self-pay | Admitting: Family Medicine

## 2018-11-19 ENCOUNTER — Other Ambulatory Visit: Payer: Self-pay | Admitting: Family Medicine

## 2018-11-19 ENCOUNTER — Ambulatory Visit (INDEPENDENT_AMBULATORY_CARE_PROVIDER_SITE_OTHER): Payer: Medicare Other | Admitting: Family Medicine

## 2018-11-19 ENCOUNTER — Other Ambulatory Visit: Payer: Self-pay

## 2018-11-19 VITALS — BP 132/76 | HR 63 | Resp 16 | Ht 63.0 in | Wt 347.2 lb

## 2018-11-19 DIAGNOSIS — R300 Dysuria: Secondary | ICD-10-CM | POA: Diagnosis not present

## 2018-11-19 MED ORDER — CIPROFLOXACIN HCL 500 MG PO TABS: 500.0000 mg | ORAL_TABLET | Freq: Two times a day (BID) | ORAL | 0 refills | Status: DC

## 2018-11-19 NOTE — Progress Notes (Signed)
   Subjective:    Patient ID: [name], female    DOB: [birth year], 48 y.o.   MRN: [id number]  HPI Dysuria- pt had Evisit on 7/3.  Pain started 7/2.  Pt reports had 'real bad' pain when attempting to stop urination.  Was occurring w/ each void.  sxs stopped on Saturday.  Urine still has strong odor.  Increased frequency.  No N/V.  Had suprapubic pressure/pain but this has improved.  Has sensation of incomplete emptying.  No fever.   Review of Systems For ROS see HPI     Objective:   Physical Exam Vitals signs reviewed.  Constitutional:      General: She is not in acute distress.    Appearance: She is well-developed. She is obese.  Abdominal:     General: There is no distension.     Palpations: Abdomen is soft.     Tenderness: There is no abdominal tenderness (no suprapubic or CVA tenderness).  Neurological:     Mental Status: She is oriented to person, place, and time.  Psychiatric:        Mood and Affect: Mood normal.           Assessment & Plan:  Dysuria- new.  Pt has hx of UTI.  Unable to void at this visit.  sxs suspicious for UTI- will start Cipro due to multiple allergies.  Reviewed supportive care and red flags that should prompt return.  Pt expressed understanding and is in agreement w/ plan.

## 2018-11-19 NOTE — Patient Instructions (Signed)
Follow up as needed or as scheduled Start the Cipro twice daily- take w/ food Drink plenty of fluids REST!  You are taking on too much Call with any questions or concerns Hang in there!!!

## 2018-11-20 ENCOUNTER — Other Ambulatory Visit: Payer: Medicare Other

## 2018-11-20 NOTE — Unmapped (Signed)
Refill request for Halobetasol 0.05% ointment.

## 2018-11-26 ENCOUNTER — Ambulatory Visit (INDEPENDENT_AMBULATORY_CARE_PROVIDER_SITE_OTHER): Payer: Medicare Other | Admitting: Family Medicine

## 2018-11-26 ENCOUNTER — Encounter: Payer: Self-pay | Admitting: Family Medicine

## 2018-11-26 ENCOUNTER — Other Ambulatory Visit: Payer: Self-pay

## 2018-11-26 DIAGNOSIS — F411 Generalized anxiety disorder: Secondary | ICD-10-CM

## 2018-11-26 MED ORDER — BUSPIRONE HCL 15 MG PO TABS: 15.0000 mg | ORAL_TABLET | Freq: Two times a day (BID) | ORAL | 3 refills | Status: DC

## 2018-11-26 NOTE — Progress Notes (Signed)
Virtual Visit via Video   I connected with patient on 11/26/18 at  2:00 PM EDT by a video enabled telemedicine application and verified that I am speaking with the correct person using two identifiers.  Location patient: Home Location provider: Acupuncturist, Office Persons participating in the virtual visit: Patient, Provider, [place] (Jess B)  I discussed the limitations of evaluation and management by telemedicine and the availability of in person appointments. The patient expressed understanding and agreed to proceed.  Subjective:   HPI:   Anxiety- ongoing issue.  On Alprazolam TID prn, Buspar 7.5mg  BID, Clonazepam QHS, Cymbalta 60mg  daily.  Sees psych who prescribes Abilify.  Woke this AM to 'momma having an out of mind experience'.  Pt reports 'I lost it.  I bawled my eyes out'.  'I feel very very nervous'.  Thinks Buspar is helping but not enough at this point.  ROS:   See pertinent positives and negatives per HPI.  Patient Active Problem List   Diagnosis Date Noted   Urinary urgency 04/04/2018   Chronic narcotic use 09/07/2016   Acute blood loss anemia 01/14/2016   Multiple gastric ulcers    PAN (polyarteritis nodosa) (HCC) 11/24/2015   Chronic gout of multiple sites 02/25/2014   GERD (gastroesophageal reflux disease) 01/08/2014   Routine general medical examination at a health care facility 04/23/2013   Bariatric surgery status 10/31/2012   Psoriasis 01/09/2012   DDD (degenerative disc disease), lumbar 11/30/2011   Migraine headache    Hot flashes 12/15/2010   [name] (obstructive sleep apnea) 11/16/2010   HYPERGLYCEMIA, FASTING 10/08/2009   CONTRACTURE OF TENDON 08/17/2009   PARESTHESIA, HANDS 12/23/2008   LEG PAIN, BILATERAL 11/05/2008   ADVERSE DRUG REACTION 04/03/2008   PERIPHERAL EDEMA 03/26/2008   Morbid obesity ([place]) 01/03/2007   Hyperlipidemia 09/22/2006   Fibromyalgia 09/20/2006   Anxiety state 06/28/2006   Essential  hypertension 06/28/2006    Social History   Tobacco Use   Smoking status: Never Smoker   Smokeless tobacco: Never Used  Substance Use Topics   Alcohol use: No    Current Outpatient Medications:    Adalimumab 40 MG/0.8ML PNKT, Inject into the skin., Disp: , Rfl:    ALPRAZolam (XANAX) 0.5 MG tablet, TAKE 1 TABLET BY MOUTH 3 TIMES DAILY AS NEEDED FOR ANXIETY OR SLEEP, Disp: 90 tablet, Rfl: 3   ARIPiprazole (ABILIFY) 10 MG tablet, Take 10 mg by mouth daily., Disp: , Rfl:    Armodafinil 250 MG tablet, Take by mouth., Disp: , Rfl:    BIOTIN PO, Take 1 tablet by mouth daily., Disp: , Rfl:    busPIRone (BUSPAR) 7.5 MG tablet, Take 1 tablet (7.5 mg total) by mouth 2 (two) times daily., Disp: 60 tablet, Rfl: 3   BYSTOLIC 10 MG tablet, TAKE 1 TABLET BY MOUTH EVERY DAY, Disp: 90 tablet, Rfl: 0   calcium citrate (CALCITRATE - DOSED IN MG ELEMENTAL CALCIUM) 950 MG tablet, Take 1 tablet by mouth daily., Disp: , Rfl:    ciprofloxacin (CIPRO) 500 MG tablet, Take 1 tablet (500 mg total) by mouth 2 (two) times daily., Disp: 6 tablet, Rfl: 0   clonazePAM (KLONOPIN) 1 MG tablet, TAKE 1 TABLET BY MOUTH EVERY DAY, Disp: 30 tablet, Rfl: 3   cyclobenzaprine (FLEXERIL) 10 MG tablet, TAKE 1 TABLET BY MOUTH 3 TIMES DAILY AS NEEDED, Disp: 270 tablet, Rfl: 0   DULoxetine (CYMBALTA) 60 MG capsule, Take 2 capsules (120 mg total) by mouth daily., Disp: 240 capsule, Rfl: 2  FEROSUL 325 (65 Fe) MG tablet, TAKE 1 TABLET BY MOUTH EVERY DAY WITH BREAKFAST, Disp: 30 tablet, Rfl: 6   furosemide (LASIX) 40 MG tablet, TAKE 2 AND 1/2 TABLETS BY MOUTH EVERY DAY, Disp: 225 tablet, Rfl: 0   gabapentin (NEURONTIN) 800 MG tablet, TAKE 1 TABLET BY MOUTH 3 TIMES DAILY, Disp: 90 tablet, Rfl: 1   HYDROcodone-acetaminophen (NORCO) 10-325 MG tablet, Take 1 tablet by mouth every 6 (six) hours as needed., Disp: 90 tablet, Rfl: 0   meclizine (ANTIVERT) 25 MG tablet, Take 1 tablet (25 mg total) by mouth 3 (three) times daily  as needed for dizziness., Disp: 30 tablet, Rfl: 0   NYSTATIN powder, APPLY 4 grams TO THE AFFECTED AREA 4 TIMES DAILY, Disp: 60 g, Rfl: 3   OXcarbazepine (TRILEPTAL) 150 MG tablet, Take 150 mg by mouth 2 (two) times daily., Disp: , Rfl: 0   pantoprazole (PROTONIX) 40 MG tablet, TAKE 1 TABLET BY MOUTH 2 TIMES DAILY, Disp: 180 tablet, Rfl: 1   pentoxifylline (TRENTAL) 400 MG CR tablet, TAKE 1 TABLET BY MOUTH 3 TIMES DAILY WITH A MEAL, Disp: , Rfl: 11   potassium iodide (SSKI) 1 GM/ML solution, Take 25 mg 3 (three) times daily by mouth., Disp: , Rfl:    promethazine (PHENERGAN) 25 MG tablet, TAKE 1 TABLET BY MOUTH EVERY 6 HOURS AS NEEDED FOR NAUSEA AND FOR VOMITING, Disp: 45 tablet, Rfl: 3   rosuvastatin (CRESTOR) 20 MG tablet, TAKE 1 TABLET BY MOUTH EVERY DAY, Disp: 90 tablet, Rfl: 1  Allergies  Allergen Reactions   Niacin Other (See Comments)    REACTION: Swelling, problems breathing   Sulfamethoxazole-Trimethoprim Other (See Comments)    REACTION: Rash, throat closed, eyes swelled.   Aspirin Hives   Bactrim Hives   Benzoin Compound Rash   Cephalexin Hives   Clindamycin/Lincomycin Hives   Doxycycline Hives   Iohexol Hives    Pt treated with PO benedryl   Lisinopril Hives   Naproxen Hives   Pnu-Imune [Pneumococcal Polysaccharide Vaccine] Rash   Sulfonamide Derivatives Rash    Objective:   There were no vitals taken for this visit.  AAOx3, NAD NCAT, EOMI No obvious CN deficits Coloring WNL Pt is able to speak clearly, coherently without shortness of breath or increased work of breathing.  Thought process is linear.  Mood is appropriate.   Assessment and Plan:   Anxiety- deteriorated.  Mom is on Hospice, Dad is waiting on test results 'that can't be good'.  She is trying to manage everything for both of them.  Encouraged her to ask Hospice about respite care.  Will increase Buspar to 15mg  BID.  Will follow closely.  [name], MD 11/26/2018

## 2018-11-26 NOTE — Progress Notes (Signed)
I have discussed the procedure for the virtual visit with the patient who has given consent to proceed with assessment and treatment.   Unable to obtain vitals  Jessica L Brodmerkel, CMA     

## 2018-11-27 ENCOUNTER — Telehealth: Payer: Self-pay | Admitting: Family Medicine

## 2018-11-27 NOTE — Unmapped (Signed)
[facility] Refill Coordination Note    Specialty Medication(s) to be Shipped:   Inflammatory Disorders: Humira    Other medication(s) to be shipped: na     Autumn Mcdonald, DOB: [birth year]  Phone: [phone] (home)       All above HIPAA information was verified with patient.     Completed refill call assessment today to schedule patient's medication shipment from the [facility] [phone]).       Specialty medication(s) and dose(s) confirmed: Regimen is correct and unchanged.   Changes to medications: Autumn Mcdonald reports starting the following medications: oxcarbazapine 150 mg bid and Buspar 15 mg bid  Changes to insurance: No  Questions for the pharmacist: No    Confirmed patient received Welcome Packet with first shipment. The patient will receive a drug information handout for each medication shipped and additional FDA Medication Guides as required.       DISEASE/MEDICATION-SPECIFIC INFORMATION        N/A    SPECIALTY MEDICATION ADHERENCE     Medication Adherence    Patient reported X missed doses in the last month: 0  Specialty Medication: humira 40 mg/0.8 ml  Patient is on additional specialty medications: No  Patient is on more than two specialty medications: No  Any gaps in refill history greater than 2 weeks in the last 3 months: no  Demonstrates understanding of importance of adherence: yes  Informant: patient  Reliability of informant: reliable  Confirmed plan for next specialty medication refill: delivery by pharmacy  Refills needed for supportive medications: not needed              humira 40 mg/0.8 ml. 3 pens remaining. 1 for tomorrow, 7/22 and 7/29      SHIPPING     Shipping address confirmed in Epic.     Delivery Scheduled: Yes, Expected medication delivery date: [id number].     Medication will be delivered via UPS to the home address in Epic WAM.    Marshe Shrestha D [name]   [facility] Specialty Technician

## 2018-11-27 NOTE — Telephone Encounter (Signed)
Called and gave the verbal ok for pt to be on both medications.

## 2018-11-27 NOTE — Telephone Encounter (Signed)
[name] with [facility] called and would like to let the doctor know that patients medications cyclobenzaprine (FLEXERIL) 10 MG tablet and promethazine (PHENERGAN) 25 MG tablet are med interaction and wants to make sure that its ok for patient to be taking both medications. She would like a call back about this, thanks.

## 2018-11-27 NOTE — Telephone Encounter (Signed)
[facility] for pt to take both

## 2018-11-27 NOTE — Telephone Encounter (Signed)
Ok for pt to be on both medications?  

## 2018-11-29 ENCOUNTER — Other Ambulatory Visit: Payer: Self-pay | Admitting: General Practice

## 2018-11-29 ENCOUNTER — Encounter: Payer: Self-pay | Admitting: Family Medicine

## 2018-11-29 DIAGNOSIS — F4321 Adjustment disorder with depressed mood: Secondary | ICD-10-CM | POA: Diagnosis not present

## 2018-11-29 DIAGNOSIS — F518 Other sleep disorders not due to a substance or known physiological condition: Secondary | ICD-10-CM | POA: Diagnosis not present

## 2018-11-29 DIAGNOSIS — F3341 Major depressive disorder, recurrent, in partial remission: Secondary | ICD-10-CM | POA: Diagnosis not present

## 2018-11-29 MED ORDER — ELETRIPTAN HYDROBROMIDE 40 MG PO TABS: 40.0000 mg | ORAL_TABLET | ORAL | 3 refills | Status: DC | PRN

## 2018-11-30 NOTE — Telephone Encounter (Signed)
Received a fax that pt Relpax is not covered by [employer]. Please advise on an alternative.

## 2018-12-05 ENCOUNTER — Other Ambulatory Visit: Payer: Self-pay | Admitting: Family Medicine

## 2018-12-05 MED ORDER — HYDROCODONE-ACETAMINOPHEN 10-325 MG PO TABS: 1.0000 | ORAL_TABLET | Freq: Four times a day (QID) | ORAL | 0 refills | Status: DC | PRN

## 2018-12-05 NOTE — Telephone Encounter (Signed)
Last OV 11/26/18 Hydrocodone last filled 11/07/18 #90 with 0

## 2018-12-06 ENCOUNTER — Other Ambulatory Visit: Payer: Self-pay

## 2018-12-06 ENCOUNTER — Ambulatory Visit
Admission: RE | Admit: 2018-12-06 | Discharge: 2018-12-06 | Disposition: A | Discharge status: Home / Self Care | Payer: Medicare Other | Source: Ambulatory Visit | Attending: Nurse Practitioner | Admitting: Nurse Practitioner

## 2018-12-06 DIAGNOSIS — Z9049 Acquired absence of other specified parts of digestive tract: Secondary | ICD-10-CM | POA: Diagnosis not present

## 2018-12-06 DIAGNOSIS — K838 Other specified diseases of biliary tract: Secondary | ICD-10-CM | POA: Diagnosis not present

## 2018-12-06 MED ORDER — GADOBENATE DIMEGLUMINE 529 MG/ML IV SOLN
20.0000 mL | Freq: Once | INTRAVENOUS | Status: AC | PRN
  Administered 2018-12-06: 20 mL via INTRAVENOUS

## 2018-12-10 MED FILL — HUMIRA PEN 40 MG/0.8 ML SUBCUTANEOUS KIT: SUBCUTANEOUS | 28 days supply | Qty: 4 | Fill #10

## 2018-12-10 MED FILL — HUMIRA PEN 40 MG/0.8 ML SUBCUTANEOUS KIT: 28 days supply | Qty: 4 | Fill #10 | Status: AC

## 2018-12-11 DIAGNOSIS — R748 Abnormal levels of other serum enzymes: Secondary | ICD-10-CM | POA: Diagnosis not present

## 2018-12-11 DIAGNOSIS — K760 Fatty (change of) liver, not elsewhere classified: Secondary | ICD-10-CM | POA: Diagnosis not present

## 2018-12-11 DIAGNOSIS — K838 Other specified diseases of biliary tract: Secondary | ICD-10-CM | POA: Diagnosis not present

## 2018-12-12 ENCOUNTER — Encounter: Payer: Self-pay | Admitting: Family Medicine

## 2018-12-14 MED ORDER — BUSPIRONE HCL 30 MG PO TABS: 30.0000 mg | ORAL_TABLET | Freq: Two times a day (BID) | ORAL | 3 refills | Status: DC

## 2018-12-18 ENCOUNTER — Other Ambulatory Visit: Payer: Self-pay | Admitting: Family Medicine

## 2018-12-21 ENCOUNTER — Ambulatory Visit: Payer: Medicare Other

## 2018-12-25 ENCOUNTER — Telehealth: Payer: Self-pay | Admitting: General Practice

## 2018-12-25 NOTE — Telephone Encounter (Signed)
Called and spoke with pt to give our sympathies for her mom passing. She wanted to extend her gratitude to Dr. Birdie Riddle for taking care of her mom. Also wanted Dr. Birdie Riddle to be aware that her mom never suffered and the entire family was there with her. They have enrolled in bereavement counseling through hospice.

## 2018-12-26 ENCOUNTER — Encounter: Payer: Self-pay | Admitting: Family Medicine

## 2018-12-26 MED ORDER — HYDROCODONE-ACETAMINOPHEN 10-325 MG PO TABS: 1.0000 | ORAL_TABLET | Freq: Four times a day (QID) | ORAL | 0 refills | Status: DC | PRN

## 2018-12-27 ENCOUNTER — Telehealth: Payer: Self-pay | Admitting: Pulmonary Disease

## 2018-12-27 NOTE — Telephone Encounter (Signed)
I called & spoke to pt.  I told her order was sent to [facility] in 10/2017. She states she didn't get machine due to financial reasons.  I gave her [facility]'s phone # & told her she could call & discuss with them and they would let her know if she can still get machine or if she will need new study.  Nothing further needed.

## 2018-12-27 NOTE — Telephone Encounter (Signed)
I will call pt.  

## 2018-12-27 NOTE — Telephone Encounter (Signed)
This pt has not been seen since 08/2017 I would believe she would need a new order since the one placed last year has surely expired.

## 2019-01-01 ENCOUNTER — Encounter: Payer: Self-pay | Admitting: Primary Care

## 2019-01-01 ENCOUNTER — Telehealth: Payer: Self-pay | Admitting: Pulmonary Disease

## 2019-01-01 ENCOUNTER — Ambulatory Visit (INDEPENDENT_AMBULATORY_CARE_PROVIDER_SITE_OTHER): Payer: Medicare Other | Admitting: Primary Care

## 2019-01-01 DIAGNOSIS — G4733 Obstructive sleep apnea (adult) (pediatric): Secondary | ICD-10-CM | POA: Diagnosis not present

## 2019-01-01 NOTE — Unmapped (Addendum)
[facility] Refill Coordination Note    Specialty Medication(s) to be Shipped:   Inflammatory Disorders: Humira    Other medication(s) to be shipped: na     Autumn Mcdonald, DOB: 25-Oct-1970  Phone: [phone] (home)       All above HIPAA information was verified with patient.     Completed refill call assessment today to schedule patient's medication shipment from the [facility] [phone]).       Specialty medication(s) and dose(s) confirmed: Regimen is correct and unchanged.   Changes to medications: Autumn Mcdonald reports starting the following medications: Buspar 30 mg bid and Oxcarbazepine 300 mg bid  Changes to insurance: No  Questions for the pharmacist: No    Confirmed patient received Welcome Packet with first shipment. The patient will receive a drug information handout for each medication shipped and additional FDA Medication Guides as required.       DISEASE/MEDICATION-SPECIFIC INFORMATION        For patients on injectable medications: Patient currently has 2 doses left.  Next injection is scheduled for [id number].    SPECIALTY MEDICATION ADHERENCE     Medication Adherence    Patient reported X missed doses in the last month: 0  Specialty Medication: humira 40 mg/0.8 ml   Patient is on additional specialty medications: No  Patient is on more than two specialty medications: No  Any gaps in refill history greater than 2 weeks in the last 3 months: no  Demonstrates understanding of importance of adherence: yes  Informant: patient  Reliability of informant: reliable  Confirmed plan for next specialty medication refill: delivery by pharmacy  Refills needed for supportive medications: not needed                humira 40 mg/0.8 ml . 14 days on ahnd      SHIPPING     Shipping address confirmed in Epic.     Delivery Scheduled: Yes, Expected medication delivery date: [id number].     Medication will be delivered via UPS to the home address in Epic WAM.    Margeart Allender D Suhaila Troiano   [facility] Specialty Technician

## 2019-01-01 NOTE — Telephone Encounter (Signed)
Called and spoke with pt in regards to the message we received from her. Stated to her due to it being over a year that she has been seen, she is due a f/u for her cpap and pt verbalized understanding. I checked airview to see if I could pull up an updated download and [name] has not been updated. That was when pt told me that her machine is at least 49 years old.  Pt has been scheduled an appt with Beth at 3pm today for mychart video visit. Stated to pt that she should qualify to receive a new machine due to how old hers is and pt said she had been told that insurance will pay for it after she has the OV. Nothing further needed.

## 2019-01-01 NOTE — Progress Notes (Signed)
Virtual Visit via Telephone Note  I connected with [name] on 01/01/19 at  3:00 PM EDT by telephone and verified that I am speaking with the correct person using two identifiers.  Location: Patient: Home Provider: Office   I discussed the limitations, risks, security and privacy concerns of performing an evaluation and management service by telephone and the availability of in person appointments. I also discussed with the patient that there may be a patient responsible charge related to this service. The patient expressed understanding and agreed to proceed.   History of Present Illness: 48 year old female, never smoked. PMH significant for [name], GERD, fibromyalgia, obesity. Patient of Dr. Elsworth Soho, last seen in 2019. HST on 10/25/17 showed mild sleep apnea, AHI 10.3/hr. Recommended CPAP auto titrate 5-15cm H20.   01/01/2019 Patient contacted today for televisit for [name] follow-up. She had been on CPAP therapy for a history of sleep apnea for approximately 13-14 years until her machine stopped working. She saw Dr. Elsworth Soho and HST in June 2019 showed mild [name]. New order for CPAP sent to [facility]. Patient states that she was unable to get new CPAP machine d/t financial reasons. She does not sleep well. Reports snoring and waking up at night. No energy. Epworth today 12.    Observations/Objective:  - NO wheezing, cough or shortness of breath  Assessment and Plan:  [name] - HST on 10/25/17 showed mild sleep apnea, AHI 10.3/hr - Epworth 12 - Sending RX for new CPAP start/machine to [facility] - Recommended auto titrate 5-15cm H20, mask of choice, humidification, supplies - Goal wear device every night for 4-6 hours or more - Advised patient do not drive if experiencing excessive daytime fatigue or somnolence - Encouraged weight loss   Follow Up Instructions:   FU in 6 weeks with download  I discussed the assessment and treatment plan with the patient. The patient was provided an opportunity  to ask questions and all were answered. The patient agreed with the plan and demonstrated an understanding of the instructions.   The patient was advised to call back or seek an in-person evaluation if the symptoms worsen or if the condition fails to improve as anticipated.  I provided 18 minutes of non-face-to-face time during this encounter.   Martyn Ehrich, NP

## 2019-01-01 NOTE — Patient Instructions (Signed)
Recommendations: Wear CPAP every night for 4-6 hours or more Do not drive if experiencing excessive daytime fatigue or somnolence   Referral: New CPAP machine and start Auto titrate 5-15cm h20, mask of choice, humidification, supplies  Follow-up: 6 weeks with download

## 2019-01-02 ENCOUNTER — Telehealth: Payer: Self-pay | Admitting: Primary Care

## 2019-01-02 DIAGNOSIS — G4733 Obstructive sleep apnea (adult) (pediatric): Secondary | ICD-10-CM

## 2019-01-02 MED ORDER — ADALIMUMAB 40 MG/0.8 ML SUBCUTANEOUS PEN KIT
SUBCUTANEOUS | 32 refills | 28 days | Status: CP
Start: 2019-01-02 — End: ?
  Filled 2019-01-09: qty 4, 28d supply, fill #0

## 2019-01-02 NOTE — Unmapped (Signed)
Adalimumab (Humira) refill  Last ov: 09/12/2018  Next ov: 03/21/2019   Labs:   AST   Date Value Ref Range Status   01/10/2018 22 17 - 47 U/L Final     ALT   Date Value Ref Range Status   01/10/2018 9 (L) 13 - 69 U/L Final     Creatinine   Date Value Ref Range Status   01/10/2018 0.58 (L) 0.60 - 1.00 mg/dL Final     WBC   Date Value Ref Range Status   01/10/2018 6.9 4.5 - 11.0 10*9/L Final     HGB   Date Value Ref Range Status   01/10/2018 12.8 12.0 - 16.0 g/dL Final     HCT   Date Value Ref Range Status   01/10/2018 40.7 36.0 - 46.0 % Final     MCV   Date Value Ref Range Status   01/10/2018 87.2 80.0 - 100.0 fL Final     RDW   Date Value Ref Range Status   01/10/2018 14.3 12.0 - 15.0 % Final     Platelet   Date Value Ref Range Status   01/10/2018 293 150 - 440 10*9/L Final     Neutrophils %   Date Value Ref Range Status   01/10/2018 54.7 % Final     Lymphocytes %   Date Value Ref Range Status   01/10/2018 26.5 % Final     Monocytes %   Date Value Ref Range Status   01/10/2018 9.8 % Final     Eosinophils %   Date Value Ref Range Status   01/10/2018 6.0 % Final     Basophils %   Date Value Ref Range Status   01/10/2018 0.6 % Final

## 2019-01-02 NOTE — Telephone Encounter (Signed)
Yes please order HST re: [name]

## 2019-01-02 NOTE — Telephone Encounter (Signed)
pts 10/2017 sleep study is too old to place new order. Per [facility] pt will need a new sleep study. Are you ok with ordering? Pt had virtual visit on yesterday.    Assessment and Plan:  [name] - HST on 10/25/17 showed mild sleep apnea, AHI 10.3/hr - Epworth 12 - Sending RX for new CPAP start/machine to [facility] - Recommended auto titrate 5-15cm H20, mask of choice, humidification, supplies - Goal wear device every night for 4-6 hours or more - Advised patient do not drive if experiencing excessive daytime fatigue or somnolence - Encouraged weight loss   Follow Up Instructions:  FU in 6 weeks with download  I discussed the assessment and treatment plan with the patient. The patient was provided an opportunity to ask questions and all were answered. The patient agreed with the plan and demonstrated an understanding of the instructions.  The patient was advised to call back or seek an in-person evaluation if the symptoms worsen or if the condition fails to improve as anticipated.

## 2019-01-03 NOTE — Telephone Encounter (Signed)
Called spoke with patient. Let her know we had to start the process over. She states that is fine. Order placed for Home sleep study.   Nothing further needed at this time.

## 2019-01-04 ENCOUNTER — Telehealth: Payer: Medicare Other | Admitting: Nurse Practitioner

## 2019-01-04 DIAGNOSIS — N3000 Acute cystitis without hematuria: Secondary | ICD-10-CM

## 2019-01-04 MED ORDER — NITROFURANTOIN MONOHYD MACRO 100 MG PO CAPS: 100.0000 mg | ORAL_CAPSULE | Freq: Two times a day (BID) | ORAL | 0 refills | Status: DC

## 2019-01-04 NOTE — Progress Notes (Signed)

## 2019-01-08 ENCOUNTER — Other Ambulatory Visit: Payer: Self-pay | Admitting: Family Medicine

## 2019-01-08 NOTE — Telephone Encounter (Signed)
Last refill: 5.5.20 #90, 3 Last OV: 7.13.20 dx. Anxiety state

## 2019-01-09 MED FILL — HUMIRA PEN 40 MG/0.8 ML SUBCUTANEOUS KIT: 28 days supply | Qty: 4 | Fill #0 | Status: AC

## 2019-01-16 ENCOUNTER — Ambulatory Visit: Payer: Medicare Other

## 2019-01-16 ENCOUNTER — Other Ambulatory Visit: Payer: Self-pay

## 2019-01-16 DIAGNOSIS — G4733 Obstructive sleep apnea (adult) (pediatric): Secondary | ICD-10-CM

## 2019-01-18 ENCOUNTER — Encounter: Payer: Self-pay | Admitting: Family Medicine

## 2019-01-18 ENCOUNTER — Ambulatory Visit (INDEPENDENT_AMBULATORY_CARE_PROVIDER_SITE_OTHER): Payer: Medicare Other | Admitting: Family Medicine

## 2019-01-18 ENCOUNTER — Telehealth: Payer: Self-pay

## 2019-01-18 ENCOUNTER — Other Ambulatory Visit: Payer: Self-pay

## 2019-01-18 ENCOUNTER — Ambulatory Visit (INDEPENDENT_AMBULATORY_CARE_PROVIDER_SITE_OTHER): Payer: Medicare Other

## 2019-01-18 VITALS — BP 136/78 | HR 82 | Temp 97.1°F | Ht 63.0 in | Wt 351.2 lb

## 2019-01-18 DIAGNOSIS — M79604 Pain in right leg: Secondary | ICD-10-CM

## 2019-01-18 DIAGNOSIS — Z23 Encounter for immunization: Secondary | ICD-10-CM | POA: Diagnosis not present

## 2019-01-18 MED ORDER — PREDNISONE 10 MG PO TABS: ORAL_TABLET | ORAL | 0 refills | Status: DC

## 2019-01-18 NOTE — Telephone Encounter (Signed)
Appt has been scheduled.

## 2019-01-18 NOTE — Progress Notes (Signed)
   Subjective:    Patient ID: [name], female    DOB: 20-Nov-1970, 48 y.o.   MRN: [id number]  HPI Leg pain- R lower leg.  'was running right down that bone in my shin'.  sxs started ~3-4 days ago.  No relief w/ pain meds.  Denies swelling.  Pain has woke pt from sleep.  Hemp cream will provide temporary relief.  No known injury.  Denies increased redness.  Pain is constant.  No relief w/ elevation.  'can't walk it out'.  No relief w/ gabapentin, hydrocodone.   Review of Systems For ROS see HPI     Objective:   Physical Exam Vitals signs reviewed.  Constitutional:      General: She is not in acute distress.    Appearance: Normal appearance. She is obese. She is not ill-appearing.  HENT:     Head: Normocephalic and atraumatic.  Cardiovascular:     Pulses: Normal pulses.  Musculoskeletal:        General: Tenderness (TTP along R anterior tibia) present.     Right lower leg: No edema.     Left lower leg: No edema.  Skin:    General: Skin is warm and dry.     Findings: Erythema (chronic venous stasis redness bilaterally) present.  Neurological:     General: No focal deficit present.     Mental Status: She is alert and oriented to person, place, and time.  Psychiatric:        Mood and Affect: Mood normal.        Behavior: Behavior normal.        Thought Content: Thought content normal.           Assessment & Plan:  R leg pain- deteriorated.  Pt has hx of pain but in the last few days pain has worsened and isn't responding to Hydrocodone, Gabapentin.  No increased swelling, redness.  Pain will radiate down shin.  No posterior calf pain. Not consistent w/ DVT.  No recent injury.  Start Prednisone taper.  Reviewed supportive care and red flags that should prompt return.  Pt expressed understanding and is in agreement w/ plan.

## 2019-01-18 NOTE — Telephone Encounter (Signed)
If she passes screening she can have one of my afternoon same day appts

## 2019-01-18 NOTE — Telephone Encounter (Signed)
Patient called in stating that she is having extreme pain in her right leg from the knee down. Patient stated that the leg pain started several days ago. Denies any injuries to the leg, no swelling. She has taken both her gabapentin and hydrocodone, no relief from either.   Patient was seen today for her flu shot. Patient was walking fine and never expressed any concerns over leg pain.

## 2019-01-18 NOTE — Patient Instructions (Signed)
Follow up as needed or as scheduled START the prednisone as directed- take w/ food.  3 pills at the same time x3 days, then 2 pills at the same time x3 days, and then 1 pill daily Continue the hydrocodone as needed ICE! Elevate! Call with any questions or concerns Hang in there!!

## 2019-01-21 ENCOUNTER — Telehealth: Payer: Medicare Other

## 2019-01-22 ENCOUNTER — Other Ambulatory Visit: Payer: Self-pay | Admitting: Family Medicine

## 2019-01-22 ENCOUNTER — Telehealth: Payer: Self-pay

## 2019-01-22 DIAGNOSIS — L409 Psoriasis, unspecified: Secondary | ICD-10-CM

## 2019-01-22 MED ORDER — HALOBETASOL PROPIONATE 0.05 % TOPICAL OINTMENT
Freq: Two times a day (BID) | TOPICAL | 0 refills | 30.00000 days | Status: CP | PRN
Start: 2019-01-22 — End: 2019-02-21

## 2019-01-22 NOTE — Unmapped (Signed)
Called, left voicemail

## 2019-01-22 NOTE — Telephone Encounter (Signed)
Patient called in stating that her BP is "extremely high." Stated that her BP has been high over the weekend. Patient has been on prednisone and said that she just hasn't felt well over the past few days. She would feel hot, dizzy, and having headaches.    Random readings over the weekend: 182/104 176/93 147/86   151/100 this morning, patient stopped the prednisone yesterday morning around 9 am, patient still has 3 days left of medication, does not feel comfortable continuing without instructions per PCP.   Stated that her BP will "go down a little bit after taking BP meds, but will go back up after a few hours."   Patient had flu shot on Friday and was seen by PCP for right leg pain. Informed patient that PCP is out of the office this week, but I was routing message to [place].  Patient informed if any chest pain or SOB to seek immediate medical attention, gave verbal understanding.

## 2019-01-22 NOTE — Telephone Encounter (Signed)
Patient is aware and agreeable to instructions per Elyn Aquas, PA

## 2019-01-22 NOTE — Telephone Encounter (Signed)
Definitely needs to stop the prednisone and remain off of it as it is elevating her BP.  If she is still symptomatic -- headache, dizziness, etc she needs to be seen at Urgent Care or ER.  If no symptoms -- limit antiinflammatories and salty foods. Keep hydrated and rest. Keep close watch on BP to make sure it is continuing to come down.  ER for any chest discomfort, Shortness of breath, racing heart or dizziness.

## 2019-01-22 NOTE — Telephone Encounter (Signed)
Last OV 01/18/19 Hydrocodone last filled 12/26/18 #90 with 0

## 2019-01-23 MED ORDER — HYDROCODONE-ACETAMINOPHEN 10-325 MG PO TABS: 1.0000 | ORAL_TABLET | Freq: Four times a day (QID) | ORAL | 0 refills | Status: DC | PRN

## 2019-01-29 DIAGNOSIS — G4733 Obstructive sleep apnea (adult) (pediatric): Secondary | ICD-10-CM | POA: Diagnosis not present

## 2019-02-01 ENCOUNTER — Ambulatory Visit: Payer: Medicare Other

## 2019-02-01 NOTE — Unmapped (Signed)
Autumn Mcdonald reports that her psoriasis isn't very well controlled - she is using halobetasol on active areas of her hips daily, which helps, but if not, she finds it difficult to wear clothing. I encouraged her to reach out to Dr. Charlsie Merles via MyChart, or to call the office.     [facility] Clinical Assessment & Refill Coordination Note    Autumn Mcdonald, DOB: July 07, 1970  Phone: [phone] (home)     All above HIPAA information was verified with patient.     Specialty Medication(s):   Inflammatory Disorders: Humira     Current Outpatient Medications   Medication Sig Dispense Refill   ??? adalimumab (HUMIRA) 40 mg/0.8 mL subcutaneous pen kit Inject the contents of 1 pen (40 mg total) under the skin once a week. 4 each 32   ??? ALPRAZolam (XANAX) 0.5 MG tablet TAKE 1 TABLET BY MOUTH 3 TIMES DAILY AS NEEDED FOR ANXIETY OR SLEEP     ??? ARIPiprazole (ABILIFY) 10 MG tablet Take 10 mg by mouth daily.     ??? armodafinil (NUVIGIL) 250 mg tablet Take 250 mg by mouth three (3) times a day.      ??? clonazePAM (KLONOPIN) 1 MG tablet TAKE 1 TABLET BY MOUTH EVERY DAY     ??? compr.stocking,knee,long,x-lrg Misc 1 each by Miscellaneous route daily. 2 each 0   ??? cyclobenzaprine (FLEXERIL) 10 MG tablet Take 10 mg by mouth once as needed.      ??? DULoxetine (CYMBALTA) 60 MG capsule Take 60 mg by mouth daily. 120 mg daily     ??? ferrous sulfate 325 (65 FE) MG tablet Take 1 tablet (325 mg total) by mouth daily with breakfast.  6   ??? furosemide (LASIX) 40 MG tablet Take 40 mg by mouth Two (2) times a day.     ??? gabapentin (NEURONTIN) 600 MG tablet TAKE 1 TABLET BY MOUTH 3 TIMES DAILY (Patient taking differently: 800 mg. ) 270 tablet 3   ??? halobetasol (ULTRAVATE) 0.05 % ointment Apply topically two (2) times a day as needed. 50 g 0   ??? HYDROcodone-acetaminophen (NORCO 10-325) 10-325 mg per tablet Take 1 tablet by mouth once as needed.      ??? nebivolol (BYSTOLIC) 10 MG tablet Take 10 mg by mouth daily.      ??? OXcarbazepine (TRILEPTAL) 150 MG tablet TAKE 1 TABLET BY MOUTH 2 TIMES DAILY  0   ??? pantoprazole (PROTONIX) 40 MG tablet Take 40 mg by mouth Two (2) times a day. bid     ??? pentoxifylline (TRENTAL) 400 mg CR tablet TAKE 1 TABLET BY MOUTH 3 TIMES DAILY WITH A MEAL 90 tablet 2   ??? potassium iodide (SSKI) 1 gram/mL solution 5 drops (0.46ml) three times daily in OJ. 237 mL 1   ??? promethazine (PHENERGAN) 25 MG tablet once as needed.      ??? rosuvastatin (CRESTOR) 20 MG tablet TAKE 1 TABLET BY MOUTH EVERY DAY       No current facility-administered medications for this visit.         Changes to medications: Ember reports no changes at this time.    Allergies   Allergen Reactions   ??? Iohexol Hives     Pt treated with PO benedryl   ??? Keflex [Cephalexin] Shortness Of Breath and Hives   ??? Lincomycin Hcl Hives   ??? Lisinopril Hives   ??? Sulfa (Sulfonamide Antibiotics) Swelling and Rash   ??? Sulfamethoxazole-Trimethoprim Hives  REACTION: Rash, throat closed, eyes swelled.   ??? Aspirin      REACTION: Hives   ??? Doxycycline      REACTION: Hives   ??? Naproxen      REACTION: Hives   ??? Niacin      REACTION: Swelling, problems breathing   ??? Benzoin Compound Rash   ??? Pneumococcal 23-Val Ps Vaccine Rash     cellulitis       Changes to allergies: No    SPECIALTY MEDICATION ADHERENCE     Humira - 2 doses left  Medication Adherence    Patient reported X missed doses in the last month: 0  Specialty Medication: Humira          Specialty medication(s) dose(s) confirmed: Regimen is correct and unchanged.     Are there any concerns with adherence? No    Adherence counseling provided? Not needed    CLINICAL MANAGEMENT AND INTERVENTION      Clinical Benefit Assessment:    Do you feel the medicine is effective or helping your condition? not really - unclear, but not well controlled psoriasis    Clinical Benefit counseling provided? recommended patient reach out to provider    Adverse Effects Assessment:    Are you experiencing any side effects? No    Are you experiencing difficulty administering your medicine? No    Quality of Life Assessment:    How many days over the past month did your psoriasis/vasculitis  keep you from your normal activities? For example, brushing your teeth or getting up in the morning. several - not able to wear certain clothes    Have you discussed this with your provider? encouraged patient to reach out    Therapy Appropriateness:    Is therapy appropriate? Yes, therapy is appropriate and should be continued    DISEASE/MEDICATION-SPECIFIC INFORMATION      For patients on injectable medications: Patient currently has 2 doses left.  Next injection is scheduled for tomorrow.    PATIENT SPECIFIC NEEDS     ? Does the patient have any physical, cognitive, or cultural barriers? No    ? Is the patient high risk? No     ? Does the patient require a Care Management Plan? No     ? Does the patient require physician intervention or other additional services (i.e. nutrition, smoking cessation, social work)? No      SHIPPING     Specialty Medication(s) to be Shipped:   Inflammatory Disorders: Humira    Other medication(s) to be shipped: na     Changes to insurance: No    Delivery Scheduled: Yes, Expected medication delivery date: Thurs, Sept 24.     Medication will be delivered via UPS to the confirmed home address in [place] [place].    The patient will receive a drug information handout for each medication shipped and additional FDA Medication Guides as required.  Verified that patient has previously received a Conservation officer, historic buildings.    All of the patient's questions and concerns have been addressed.    [name]   [facility] Shared [facility] Specialty Pharmacist

## 2019-02-04 NOTE — Unmapped (Signed)
Autumn Mcdonald is past due for follow up. Sending the photos to you, Dr. Charlsie Merles so you can see them. Regardless, she needs to be scheduled to be seen based on her MyChart message.     Call Center, will you schedule [name] for an appointment?

## 2019-02-04 NOTE — Unmapped (Signed)
Appointment 02/14/2019 at 2:30 pm with [facility] in [facility]    Thanks

## 2019-02-06 ENCOUNTER — Other Ambulatory Visit: Payer: Self-pay | Admitting: Family Medicine

## 2019-02-06 ENCOUNTER — Telehealth: Payer: Self-pay | Admitting: Pulmonary Disease

## 2019-02-06 ENCOUNTER — Encounter: Payer: Self-pay | Admitting: Gynecology

## 2019-02-06 DIAGNOSIS — G4733 Obstructive sleep apnea (adult) (pediatric): Secondary | ICD-10-CM

## 2019-02-06 MED FILL — HUMIRA PEN 40 MG/0.8 ML SUBCUTANEOUS KIT: 28 days supply | Qty: 4 | Fill #1 | Status: AC

## 2019-02-06 MED FILL — HUMIRA PEN 40 MG/0.8 ML SUBCUTANEOUS KIT: SUBCUTANEOUS | 28 days supply | Qty: 4 | Fill #1

## 2019-02-06 NOTE — Telephone Encounter (Signed)
ATC pt, no answer. Left message for pt to call back.  RA please advise on HST results.

## 2019-02-08 NOTE — Telephone Encounter (Signed)
Mild [name] 8/h  ( as before ) while sleeping in recliner OK to proceed with CPAP since she is symptomatic Forwarding to BW who last saw pt

## 2019-02-08 NOTE — Telephone Encounter (Signed)
Called spoke to patient. She said she would like to start on CPAP.  I placed the order. Appt made with BW on 03/25/19 at 1400. Nothing further needed at this time.

## 2019-02-08 NOTE — Telephone Encounter (Signed)
Please let patient know her sleep test showed mild sleep apnea. If agreeable, recommend starting CPAP therapy. Auto titrate 5-15cm H20, mask of choice, humidification and supplies. Needs follow up visit in office in 31-90 days with me

## 2019-02-11 ENCOUNTER — Other Ambulatory Visit: Payer: Self-pay | Admitting: Family Medicine

## 2019-02-13 NOTE — Unmapped (Signed)
Video visit on 03/04/2019 at 9:20 am with Dr Charlsie Merles. Patient was given Doximity instructions and was asked to upload images to her mychart prior to the visit    Thanks

## 2019-02-15 ENCOUNTER — Other Ambulatory Visit: Payer: Self-pay | Admitting: Physician Assistant

## 2019-02-15 MED ORDER — HYDROCODONE-ACETAMINOPHEN 10-325 MG PO TABS: 1.0000 | ORAL_TABLET | Freq: Four times a day (QID) | ORAL | 0 refills | Status: DC | PRN

## 2019-02-15 NOTE — Telephone Encounter (Signed)
Last OV 01/18/19 hydrocodone last filled 01/23/19 #90 with 0

## 2019-02-25 ENCOUNTER — Other Ambulatory Visit: Payer: Self-pay

## 2019-02-26 ENCOUNTER — Other Ambulatory Visit: Payer: Self-pay | Admitting: Gynecology

## 2019-02-26 ENCOUNTER — Ambulatory Visit (INDEPENDENT_AMBULATORY_CARE_PROVIDER_SITE_OTHER): Payer: Medicare Other

## 2019-02-26 ENCOUNTER — Ambulatory Visit (INDEPENDENT_AMBULATORY_CARE_PROVIDER_SITE_OTHER): Payer: Medicare Other | Admitting: Gynecology

## 2019-02-26 ENCOUNTER — Encounter: Payer: Self-pay | Admitting: Gynecology

## 2019-02-26 VITALS — BP 130/80

## 2019-02-26 DIAGNOSIS — R1031 Right lower quadrant pain: Secondary | ICD-10-CM | POA: Diagnosis not present

## 2019-02-26 DIAGNOSIS — R102 Pelvic and perineal pain: Secondary | ICD-10-CM | POA: Diagnosis not present

## 2019-02-26 DIAGNOSIS — E669 Obesity, unspecified: Secondary | ICD-10-CM | POA: Diagnosis not present

## 2019-02-26 DIAGNOSIS — N83201 Unspecified ovarian cyst, right side: Secondary | ICD-10-CM | POA: Diagnosis not present

## 2019-02-26 NOTE — Patient Instructions (Signed)
Follow-up for annual exam in January when due

## 2019-02-26 NOTE — Progress Notes (Signed)
    [name] 1970/12/08 [id number]        48 y.o.  G1P1 presents for ultrasound.  She was seen 05/15/2018 with a one-month history of right lower quadrant pain.  Ultrasound was ordered at that time to rule out pelvic pathology.  She is status post TAH in the past.  Abdominal girth precludes adequate pelvic exam.  She has not schedule it until now and is currently without complaints without pain or other symptoms.  Past medical history,surgical history, problem list, medications, allergies, family history and social history were all reviewed and documented in the EPIC chart.  Directed ROS with pertinent positives and negatives documented in the history of present illness/assessment and plan.  Exam: Vitals:   02/26/19 1232  BP: 130/80   General appearance:  Normal  Ultrasound transvaginal consistent with history of hysterectomy.  Vaginal cuff normal.  Right and left ovaries identified and normal.  Small simple cyst in right ovary 20 mm noted.  No pelvic masses or free fluid noted.  Assessment/Plan:  48 y.o. G1P1 with history of pain approaching a year ago now resolved.  Followed up for ultrasound now which is normal status post hysterectomy.  Reassured patient with normal ultrasound results.  She will schedule her follow-up annual exam in January when she is due.    Anastasio Auerbach MD, 12:43 PM 02/26/2019

## 2019-03-01 ENCOUNTER — Telehealth: Payer: Self-pay | Admitting: Family Medicine

## 2019-03-01 MED ORDER — CLOTRIMAZOLE-BETAMETHASONE 1-0.05 % EX CREA: 1.0000 "application " | TOPICAL_CREAM | Freq: Two times a day (BID) | CUTANEOUS | 0 refills | Status: DC

## 2019-03-01 NOTE — Telephone Encounter (Signed)
Pt called in stating that she has heat rash under her breast she states that she has been putting nystatin powder on it but it is not getting better. She wanted to know if [name] would send something in for her. Pt uses [facility]. Pt can be reached at the home #

## 2019-03-01 NOTE — Telephone Encounter (Signed)
Please advise 

## 2019-03-01 NOTE — Telephone Encounter (Signed)
Nystatin powder is my first choice for this type of rash but if that's not working we can send in Lotrisone cream to apply twice daily

## 2019-03-01 NOTE — Telephone Encounter (Signed)
Pt notified and stated an understanding. Med filled to pharmacy.

## 2019-03-04 ENCOUNTER — Encounter: Admit: 2019-03-04 | Discharge: 2019-03-05 | Payer: MEDICARE

## 2019-03-04 ENCOUNTER — Encounter: Payer: Self-pay | Admitting: Family Medicine

## 2019-03-04 DIAGNOSIS — M300 Polyarteritis nodosa: Principal | ICD-10-CM

## 2019-03-04 DIAGNOSIS — I872 Venous insufficiency (chronic) (peripheral): Principal | ICD-10-CM

## 2019-03-04 MED ORDER — GABAPENTIN 600 MG TABLET: 1200 mg | tablet | Freq: Three times a day (TID) | 3 refills | 15 days | Status: AC

## 2019-03-04 MED ORDER — CALCIPOTRIENE 0.005 % TOPICAL OINTMENT: g | Freq: Two times a day (BID) | 0 refills | 0 days | Status: AC

## 2019-03-04 NOTE — Unmapped (Signed)
Teledermatology Note- Return Patient   03/04/2019      Encounter Description/Consent: This encounter was conducted from Provider home office  via live, face-to-face video conference with the patient. Autumn Mcdonald was located in [place].  The patient verified her identity with her date of birth and verbally consented to evaluation and management of her condition through telemedicine.     Total time spent reviewing chart, uploaded images, and discussion with patient: 30 minutes    I spent 18 minutes on the real-time audio and video with the patient. I spent an additional 12 minutes on pre- and post-visit activities.     The patient was physically located in [place] or a state in which I am permitted to provide care. The patient and/or parent/guardian understood that s/he may incur co-pays and cost sharing, and agreed to the telemedicine visit. The visit was reasonable and appropriate under the circumstances given the patient's presentation at the time.    The patient and/or parent/guardian has been advised of the potential risks and limitations of this mode of treatment (including, but not limited to, the absence of in-person examination) and has agreed to be treated using telemedicine. The patient's/patient's family's questions regarding telemedicine have been answered.     If the visit was completed in an ambulatory setting, the patient and/or parent/guardian has also been advised to contact their provider???s office for worsening conditions, and seek emergency medical treatment and/or call 911 if the patient deems either necessary.      NOTE: Telemedicine enables health care providers at different locations to provide safe, effective, and convenient care through the use of technology. As with any health care service, there are risks associated with the use of telemedicine and teledermatology, including lack of visualization,and there may be instances where the patient needs to be seen is person This visit was performed via telemedicine during the COVID-19 Health Crisis during a State of National Emergency.   The impression and recommendations were made on the basis of the history obtained via phone or video connection  and the physical exam reviewed electronically, which is limited by/to the uploaded images and their intrinsic quality.  All questions were answered, and the patient agreed to proceed.      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 Eyvonne Left favoring nodular vasculitis/erythema induratum.  - While there are not venous changes on duplex, my prior review of the article Does severe venous insufficiency have a different etiology in the morbidly obese? ??Is it venous? by Telford Nab et al, suggests that her morbid obesity contributes to impedence of both venous return and lymph return resulting in lymphovenous hypertension and thus changes of venous stasis and lymphedema seen on clinically on her exam.  - Continue on Humira 40mg  SQ weekly for EN/EI component.  - We stopped SSKI  - Continue pentoxifylline 400mg  TID  - Continue horse chestnut extract 250mg  BID  - Increase Gabapentin 1200 mg TID.  Continue to follow with rheumatology  - She is unable to tolerate compression and SCDs.    - continue with lymphedema massage which has been helpful.    - lymphedema clinic not planning for any follow up and unfortunately their assist devices are not covered by medicare even with a letter I wrote in support.  - given that she feels she is flaring, I think it is best  to see her in person for her follow up.  I will have her see my colleague Dr. Elder Cyphers for a one-time second opinion who specializes in inflammatory disorders of the lower legs.    Plaque psoriasis:  - once weekly humira  - halobetasol BID PRN - add calcipotriene to be mixed 1:1 with halobetasol  ??  High risk medication use (Humira)  - quant gold neg Jan 2020    RV: 3 m    [facility] Covid19 helpline # is [phone].   ____________________________________________________________________________________________    CC:  Evaluation of  Lower extremities    HPI:  Autumn Mcdonald is a friendly 48 y.o. female evaluated today in a teledermatology encounter for evaluation of EN and stasis/lymphedema dermatitis.    She reports her legs are flaring and very painful.  She states she is still getting erythematous areas on the lower extremities.  She is tearful in discussing this and feel like the pain keep her from doing activities and being active with people of her cohort.  She is on horse chestnut, pentoxifylline, gabapentin, humira.  She is doing weekly lymphatic massage by a therapist close to home.  Unfortunately we were not able to get anything approved by her insurance even with a letter with [facility] lymphedema [facility].  She reports the lymphatic massage certainly seems to help but can lead to the legs being painful the next day.  She was unable to tolerate the SCDs she bought online.    She also feels the psoriasis on her thighs is flaring.  She sent in blurry photos of this online.  It responds to the halobetasol ointment, but takes 6 days to resolved.  It can be tender at times.      Pertinent PMH/FH/SH/Allergies, Medications:    Reviewed in Epic    ROS:     Negative for fevers, chills, recent illnesses, GI upset.  She does endorse feeling cold when other are hot.  Denies slowness in motion.  Denies any other skin complaints today.    PE:    General: Conversational, in no obvious distress.  Neuro: Alert, answering questions appropriately.  Skin:photos of the b/l thighs uploaded showing blurry images of what appears to be well demarcated erythematous scaling plaques      Lennon Alstrom, MD  03/04/2019

## 2019-03-05 ENCOUNTER — Other Ambulatory Visit: Payer: Self-pay | Admitting: Family Medicine

## 2019-03-05 NOTE — Telephone Encounter (Signed)
Last OV 01/18/19  Clonazepam last filled 11/13/18 #30 with 3

## 2019-03-06 NOTE — Unmapped (Signed)
[facility] Refill Coordination Note    Specialty Medication(s) to be Shipped:   Inflammatory Disorders: Humira    Other medication(s) to be shipped: na     Autumn Mcdonald, DOB: Mar 27, 1971  Phone: [phone] (home)       All above HIPAA information was verified with patient.     Completed refill call assessment today to schedule patient's medication shipment from the [facility] [phone]).       Specialty medication(s) and dose(s) confirmed: Regimen is correct and unchanged.   Changes to medications: Autumn Mcdonald reports no changes at this time.  Changes to insurance: No  Questions for the pharmacist: No    Confirmed patient received Welcome Packet with first shipment. The patient will receive a drug information handout for each medication shipped and additional FDA Medication Guides as required.       DISEASE/MEDICATION-SPECIFIC INFORMATION        For patients on injectable medications: Patient currently has 2 doses left.  Next injection is scheduled for 102420.    SPECIALTY MEDICATION ADHERENCE     Medication Adherence    Patient reported X missed doses in the last month: 1  Specialty Medication: humira 40 mg/0.8 ml  Patient is on additional specialty medications: No  Any gaps in refill history greater than 2 weeks in the last 3 months: no  Demonstrates understanding of importance of adherence: yes  Informant: patient  Reliability of informant: reliable  Confirmed plan for next specialty medication refill: delivery by pharmacy  Refills needed for supportive medications: not needed                humira 40 mg/0.8 ml. 14 days on hand      SHIPPING     Shipping address confirmed in Epic.     Delivery Scheduled: Yes, Expected medication delivery date: 102720.     Medication will be delivered via UPS to the home address in Epic WAM.    Maxtyn Nuzum D Talyia Allende   [facility] Specialty Technician

## 2019-03-07 ENCOUNTER — Encounter: Payer: Self-pay | Admitting: Family Medicine

## 2019-03-07 MED ORDER — FLUCONAZOLE 100 MG PO TABS: 100.0000 mg | ORAL_TABLET | Freq: Every day | ORAL | 0 refills | Status: DC

## 2019-03-08 ENCOUNTER — Other Ambulatory Visit: Payer: Self-pay | Admitting: Family Medicine

## 2019-03-11 ENCOUNTER — Other Ambulatory Visit: Payer: Self-pay | Admitting: Family Medicine

## 2019-03-11 MED ORDER — HYDROCODONE-ACETAMINOPHEN 10-325 MG PO TABS: 1.0000 | ORAL_TABLET | Freq: Four times a day (QID) | ORAL | 0 refills | Status: DC | PRN

## 2019-03-11 MED FILL — HUMIRA PEN 40 MG/0.8 ML SUBCUTANEOUS KIT: 28 days supply | Qty: 4 | Fill #2 | Status: AC

## 2019-03-11 MED FILL — HUMIRA PEN 40 MG/0.8 ML SUBCUTANEOUS KIT: SUBCUTANEOUS | 28 days supply | Qty: 4 | Fill #2

## 2019-03-11 NOTE — Telephone Encounter (Signed)
Last OV 01/18/19 Hydrocodone last filled 02/15/19 #90 with 0

## 2019-03-12 ENCOUNTER — Other Ambulatory Visit: Payer: Self-pay | Admitting: Family Medicine

## 2019-03-12 NOTE — Unmapped (Signed)
Called attempting to complete pre-rooming. [id number]

## 2019-03-13 ENCOUNTER — Encounter: Admit: 2019-03-13 | Discharge: 2019-03-14 | Payer: MEDICARE | Attending: Dermatology | Primary: Dermatology

## 2019-03-13 DIAGNOSIS — M300 Polyarteritis nodosa: Principal | ICD-10-CM

## 2019-03-13 DIAGNOSIS — L409 Psoriasis, unspecified: Principal | ICD-10-CM

## 2019-03-13 DIAGNOSIS — S0181XA Laceration without foreign body of other part of head, initial encounter: Principal | ICD-10-CM

## 2019-03-13 DIAGNOSIS — I872 Venous insufficiency (chronic) (peripheral): Principal | ICD-10-CM

## 2019-03-13 DIAGNOSIS — I890 Lymphedema, not elsewhere classified: Principal | ICD-10-CM

## 2019-03-13 NOTE — Unmapped (Addendum)
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 Eyvonne Left favoring nodular vasculitis/erythema induratum.  - Suspect morbid obesity contributes to impedence of both venous and lymphatic return resulting in stasis changes (secondary lymphedema)   - Continue on Humira 40mg  SQ weekly for EN/EI component for now. Will consider switching agents to better biologic for psoriasis--skyrizi may be a good choice given robust manufacturer's assistance program. Rituxan would be another option for PAN but may be best to try this other switch first as psoriasis seems to be the more bothersome process and given concerns regarding rituxan and COVID 19 infection risk/ potential future COVID 19 vaccine candidacy.   - Continue pentoxifylline 400mg  TID  - Continue horse chestnut extract 250mg  BID  - Increase Gabapentin 1200 mg TID. Continue to follow with rheumatology  -??She is unable to tolerate compression and SCDs. continue with lymphedema massage PRN which has been helpful.  - Tocilizumab use has been also described for cPAN that has failed TNF therapy and may be considered in the future.   - Toficitinib has also been utilized for PAN and can help with psoriasis and may be considered in the future.   ??  Plaque psoriasis: poorly controlled on legs  - seems to be the more bothersome issue right now for pt  - as above, will cont humira for now but consider switch to other biologic agent. Will discuss with primary dermatologist Dr. Charlsie Merles. Discussed that humira can paradoxically exacerbate psoriasis for some pts though distribution of her flare is atypical for this   - halobetasol BID PRN to active areas, can mix 1:1 with calcipotriene  ??  High risk medication use (Humira)  - quant gold neg Jan 2020    Forehead laceration  - already beginning to reapproximate, discussed that should not need stitches today  - per pt PCP has evaluated and determined imaging not indicated  - will dress with steri strips and discuss gentle wound care    Return in about 3 months (around 06/13/2019) for PAN, psoriasis. or sooner as needed      CC:  Chief Complaint   Patient presents with   ??? Eczema     Things have not been improving.    ??? Lesion Of Concern     Upper inner legs above knee, outside of ankle, peeling, flaking, dry. Tx halobetasol         HPI:  48 y.o. female seen in consultation today at the request of Lennon Alstrom MD by Venia Carbon, MD for evaluation of cutaneous PAN and psoriasis    Concerns today include:    Persistence of scaly, itchy painful spots on lower legs. Occasionally also gets tender bumps on the legs. Currently using humira weekly and halobetasol ointment without significant improvement. Taking pentoxifylline consistently, not sure if helping.    The patient denies any other new or changing lesions or areas of concern.       Prior history:  - Course: failed MTX (11/2014), plaquenil (8/16), imuran (3/17), imuran (5/17), dapsone; added Humira 06/2017.  - Biopsies: small vessel vasculitis suggestive for PAN in 2016; venous stasis disease 08/2016; deep arteritis with some ??  - Cellulitis of the R lower extremities in setting of chronic venous stasis dermatitis and chronic leg edema. Cultures with 3+ pasturella and  3+ MSSA. Resolved s/p azithromycin, moxifloxacin 400mg .  Pertinent PMH: Reviewed in Epic.     Patient Active Problem List   Diagnosis   ??? Anxiety   ??? History of surgical procedure   ??? Degeneration of intervertebral disc   ??? Muscle pain   ??? Cutaneous fungal infection   ??? Gastroesophageal reflux disease   ??? Hyperlipidemia   ??? Hypertension   ??? Migraine headache   ??? Morbid obesity (CMS-HCC)   ??? [name] (obstructive sleep apnea)   ??? Psoriasis   ??? PAN (polyarteritis nodosa) (CMS-HCC)   ??? DJD (degenerative joint disease)   ??? Fibromyalgia ??? Neuropathy   ??? Acute blood loss anemia   ??? Adverse drug reaction   ??? Bleeding stomach ulcer   ??? BRBPR (bright red blood per rectum)   ??? Chronic gout of multiple sites   ??? Chronic narcotic use   ??? Contracture of tendon   ??? Elevated blood sugar   ??? Other malaise and fatigue   ??? Frequent urination   ??? Melena   ??? Leg pain, lateral   ??? Leg pain, right   ??? Pain in joint, lower leg   ??? Ingrowing nail   ??? Other abnormal glucose       ROS: Baseline state of health. No fever, no other skin lesions except as noted in HPI    PE:  General: Well-developed, well-nourished, in no acute distress  Neuro: Alert and oriented, interacts appropriately  Mouth: not examined as patient wearing a mask  Ext: no inflammatory or dystrophic changes of nails  Skin: Examination with inspection and palpation of the head, neck, chest,  right upper extremity, left upper extremity, right lower extremity, left lower extremity, was performed and notable for the following. All other areas examined were normal or had no significant findings.  - erythematous scaly plaques on lower legs and R chest with some excoriation and erosion  - Woody induration of lower legs with marked hyperpigmentation and orange discoloration.   - left posterior calf with firm subcutaneous plaque   - small horizontal laceration of upper forehead with associated ecchymosis

## 2019-03-13 NOTE — Unmapped (Addendum)
It was nice to see you today! Your resident doctor was Dr. Harrison Mons.    If any of your medications are too expensive, look for a coupon at [email or web address].  - Enter the medication name, size, and your zip code to find coupons for local pharmacies.   - Print a coupon and bring it to the pharmacy, or pull up the coupon on a smartphone.  - You can also call pharmacies to ask about the cost of your medication before you pick it up.  If you still cannot afford your medication, please let us know.    Please call our clinic at [phone] with any concerns. We look forward to seeing you again!    We will discuss with Dr Charlsie Merles and get back to you regarding a possible change in medication to help with the psoriasis

## 2019-03-14 ENCOUNTER — Encounter: Payer: Self-pay | Admitting: Family Medicine

## 2019-03-15 ENCOUNTER — Encounter: Payer: Self-pay | Admitting: Family Medicine

## 2019-03-15 DIAGNOSIS — L409 Psoriasis, unspecified: Principal | ICD-10-CM

## 2019-03-15 MED ORDER — HALOBETASOL PROPIONATE 0.05 % TOPICAL OINTMENT
Freq: Two times a day (BID) | TOPICAL | 2 refills | 0 days | Status: CP | PRN
Start: 2019-03-15 — End: 2019-04-14

## 2019-03-15 NOTE — Unmapped (Signed)
Halobetasol refilled. 

## 2019-03-15 NOTE — Unmapped (Signed)
Patient wants a refill on her halobetasol ointment

## 2019-03-18 MED ORDER — HALOBETASOL PROPIONATE 0.05 % EX CREA: TOPICAL_CREAM | Freq: Two times a day (BID) | CUTANEOUS | 0 refills | Status: DC

## 2019-03-19 ENCOUNTER — Ambulatory Visit: Payer: Medicare Other

## 2019-03-20 ENCOUNTER — Encounter: Payer: Self-pay | Admitting: Family Medicine

## 2019-03-21 ENCOUNTER — Encounter: Admit: 2019-03-21 | Discharge: 2019-03-22 | Payer: MEDICARE

## 2019-03-21 ENCOUNTER — Institutional Professional Consult (permissible substitution): Admit: 2019-03-21 | Discharge: 2019-03-22 | Payer: MEDICARE

## 2019-03-21 DIAGNOSIS — L409 Psoriasis, unspecified: Principal | ICD-10-CM

## 2019-03-21 DIAGNOSIS — M300 Polyarteritis nodosa: Principal | ICD-10-CM

## 2019-03-21 NOTE — Unmapped (Signed)
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    Identification: Pt self identified using name and date of birth  Patient location: [phone], [place] home  The limitations of this telemedicine encounter were discussed with patient. Both the patient and myself agreed to this encounter despite these limitations. Benefits of this telemedicine encounter included allowing for continued care of patient and minimizing risk of exposure to COVID-19. Patient also aware that this is a billable encounter with possible copay. 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 Biopsies in 2016 showed small vessel vasculitis suggestive for PAN in 2016. Started prednisone taper and then mtx in 11/2014. Plaquenil started in 12/2014. Started imuran in 07/2015, and titrated up to 150 mg qd w/o sufficient control of her PAN despite being on methotrexate and hydroxychloroquine concurrently. Thus, in 09/2015 she was started on??mycophenolate mofetil, and titrated up to 1500 mg BID. She continued to have persistent leg swelling and pain despite maximized cellcept. There was concern high doses of prednisone was contributing to leg swelling at this point so prednisone tapered off but leg pain worsened off. Given this, she was re-biopsied. Biopsy from 08/2016 more consistent with venous stasis disease, so it was unclear if her symptoms were due to PAN vs venous stasis. As a trial for treatment of PAN, we discontinued??mycophenolate mofetil??and she was started on dapsone by dermatology. Patient developed LFT abnormalities on dapsone and had worsening of leg symptoms so dapsone was discontinued. Repeat biopsy performed again??with findings this time??suggestive for erythema induratum/nodular vasculitis so patient was started on oral potassium iodide and receiving intralesional steroid injections starting in November/December 2018. Was evaluated by vascular surgery and no vascular insufficiency was noted, thus her leg swelling has been attributed to lymphedema. Evaluated at lymphedema clinic at [facility] this past summer with recommendation for compression stocking.??Placed on pentoxifylline.??At follow-up in??January 2019, she had developed new skin lesions concerning for psoriasis while taking??hydroxychloroquine 200 mg po bid, prednisone 20 mg po qd, and potassium iodide qid. Evaluation by Dermatology confirmed plaque psoriasis. Humira was added in March 2019 by Dermatology. ??At follow-up in??Dermatology in April 2019, she was??advised to taper prednisone. ??At follow-up in May 2019??with myself??hydroxychloroquine discontinued for concerns of this aggravating newly diagnosed psoriasis.????At last visit with myself in September 2019, she had been able to taper off prednisone following dermatology appointment. She had been able to go three weeks without a major flare off prednisone. She did have episode of cellulitis in July 2019 requiring antibiotic therapy. She did continue adalimumab??every other week??and??potassium iodide qid through this episode of cellulitis.????She is also on pentoxifylline. ??She thought her fibromyalgia was maybe more active.      HISTORY: Ms. Passey is a 48 y.o. female with psoriasis and question of PAN vs erythema induratum/nodular vasculitis. Last seen by me in September 2019. Completed a televisit in April 2020.  Pt presents via phone call for follow up.     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 She was recently by dermatology and they were considering IL23 inhibitor Risankizumab in place of adalimumab. She reports her PAN vs erythema induratum is stable. Potassium iodide has now been discontinued. Reports she had labs done recently locally with a hepatologist. Advised her to have results faxed to me to review. She has completed her seasonal flu vaccine.      REVIEW OF SYSTEMS: Attests to the above, otherwise all other review  of systems is negative.    CURRENT MEDICATIONS:  Current Outpatient Medications   Medication Sig Dispense Refill   ??? adalimumab (HUMIRA) 40 mg/0.8 mL subcutaneous pen kit Inject the contents of 1 pen (40 mg total) under the skin once a week. 4 each 32   ??? ALPRAZolam (XANAX) 0.5 MG tablet TAKE 1 TABLET BY MOUTH 3 TIMES DAILY AS NEEDED FOR ANXIETY OR SLEEP     ??? ARIPiprazole (ABILIFY) 10 MG tablet Take 10 mg by mouth daily.     ??? armodafinil (NUVIGIL) 250 mg tablet Take 250 mg by mouth three (3) times a day. ??? busPIRone (BUSPAR) 30 MG tablet Take 30 mg by mouth.     ??? calcipotriene (DOVONOX) 0.005 % ointment Apply topically Two (2) times a day. Mix with halobetasol 60 g 0   ??? clonazePAM (KLONOPIN) 1 MG tablet TAKE 1 TABLET BY MOUTH EVERY DAY     ??? cyclobenzaprine (FLEXERIL) 10 MG tablet Take 10 mg by mouth once as needed.      ??? DULoxetine (CYMBALTA) 60 MG capsule Take 60 mg by mouth daily. 120 mg daily     ??? eletriptan (RELPAX) 40 MG tablet Take 40 mg by mouth.     ??? ferrous sulfate 325 (65 FE) MG tablet Take 1 tablet (325 mg total) by mouth daily with breakfast.  6   ??? furosemide (LASIX) 40 MG tablet Take 40 mg by mouth Two (2) times a day.     ??? gabapentin (NEURONTIN) 600 MG tablet Take 2 tablets (1,200 mg total) by mouth Three (3) times a day. 90 tablet 3   ??? halobetasol (ULTRAVATE) 0.05 % ointment Apply topically two (2) times a day as needed. To red scaly areas until improved 50 g 2   ??? HYDROcodone-acetaminophen (NORCO 10-325) 10-325 mg per tablet Take 1 tablet by mouth once as needed.      ??? nebivolol (BYSTOLIC) 10 MG tablet Take 10 mg by mouth daily.      ??? OXcarbazepine (TRILEPTAL) 150 MG tablet TAKE 1 TABLET BY MOUTH 2 TIMES DAILY  0   ??? OXcarbazepine (TRILEPTAL) 300 MG tablet Take 300 mg by mouth.     ??? pantoprazole (PROTONIX) 40 MG tablet Take 40 mg by mouth Two (2) times a day. bid     ??? pentoxifylline (TRENTAL) 400 mg CR tablet TAKE 1 TABLET BY MOUTH 3 TIMES DAILY WITH A MEAL 90 tablet 2   ??? promethazine (PHENERGAN) 25 MG tablet once as needed.      ??? rosuvastatin (CRESTOR) 20 MG tablet TAKE 1 TABLET BY MOUTH EVERY DAY       No current facility-administered medications for this visit.        Past Medical History:   Diagnosis Date   ??? Allergic     See other notes   ??? Arthritis 05/16/2001   ??? Bleeding gastric ulcer    ??? Cervical cancer (CMS-HCC)    ??? Depression    ??? Disorder of skin or subcutaneous tissue 2008    Diagnosised in   ??? Eczema 05/16/2014   ??? Fibromyalgia    ??? Hypertension ??? Morbid obesity (CMS-HCC)    ??? Psoriasis Last 2 years        Immunization History   Administered Date(s) Administered   ??? INFLUENZA TIV (TRI) 36MO+ W/ PRESERV (IM) 03/17/2011   ??? Influenza Vaccine Quad (IIV4 PF) 42mo+ injectable 02/17/2015, 05/17/2015, 02/23/2016, 02/13/2017, 01/18/2019   ??? Influenza Virus Vaccine, unspecified formulation 02/27/2013, 06/17/2015,  03/14/2016, 01/11/2018   ??? Influenza Whole 02/15/2010   ??? PNEUMOCOCCAL POLYSACCHARIDE 23 03/17/2011, 01/10/2018   ??? Pneumococcal Conjugate 13-Valent 10/04/2017   ??? TdaP 01/05/2016, 07/24/2018   ??? Tetanus and diptheria,(adult), adsorbed, 2Lf tetanus toxoid, PF 05/16/2002       PHYSICAL EXAM:  General:   Does not sound to be in distress   Lungs:  No wheezing, coughing, or increased respiratory effort noted   Psych:  Appropriate interaction       Record Review: Available records were reviewed, including pertinent office visits, labs, and imaging.     ASSESSMENT/PLAN: In summary, 48 y.o. 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 Patient reports psoriasis worsening. PAN vs erythema induratum is stable. Has not significantly improved with adalimumab. Dermatology considering IL23 inhibition with risankizumab for treat psoriasis. Given limited response from the PAN vs erythema induratum with anti-TNF therapy, seems reasonable to discontinue anti-TNF therapy and consider need for IL23. FMS appears stable on duloxetine and gabapentin. Advised her to send recent labs completed locally to me to review. Follow-up in 4 months virtually with Carlus Pavlov [facility] and 8 months with myself in person.        Diagnosis ICD-10-CM Associated Orders   1. Cutaneous polyarteritis nodosa (CMS-HCC)  M30.0    2. Psoriasis  L40.9 I spent 12 minutes on the telephone visit with the patient. I spent an additional 10 minutes on pre- and post-visit activities.     The patient was not located and I was not located within 250 yards of a hospital based location during the audio and video visit. The patient was physically located in [place] or a state in which I am permitted to provide care. The patient and/or parent/guardian understood that s/he may incur co-pays and cost sharing, and agreed to the telemedicine visit. The visit was reasonable and appropriate under the circumstances given the patient's presentation at the time.    The patient and/or parent/guardian has been advised of the potential risks and limitations of this mode of treatment (including, but not limited to, the absence of in-person examination) and has agreed to be treated using telemedicine. The patient's/patient's family's questions regarding telemedicine have been answered.    If the visit was completed in an ambulatory setting, the patient and/or parent/guardian has also been advised to contact their provider???s office for worsening conditions, and seek emergency medical treatment and/or call 911 if the patient deems either necessary.      Alizey Noren C. Scarlette Calico, MD, PhD  Assistant Professor of Medicine  Department of Medicine/Division of Rheumatology  [facility]  2:09 PM

## 2019-03-22 ENCOUNTER — Ambulatory Visit: Payer: Medicare Other

## 2019-03-25 ENCOUNTER — Ambulatory Visit: Payer: Medicare Other | Admitting: Primary Care

## 2019-03-28 ENCOUNTER — Other Ambulatory Visit: Payer: Self-pay | Admitting: Family Medicine

## 2019-03-28 NOTE — Telephone Encounter (Signed)
Last OV 01/18/19 Hydrocodone last filled 03/11/19 #90 with 0

## 2019-03-28 NOTE — Unmapped (Signed)
Hi,    Patient is requesting for sometime to be called in? She was seen 10/28. Can you also call patient? She stated that she was in a lot of pain?    Thanks

## 2019-03-28 NOTE — Unmapped (Signed)
Will do. Thanks.

## 2019-03-30 NOTE — Unmapped (Signed)
Sent skyrizi to [employer]

## 2019-04-01 ENCOUNTER — Encounter: Payer: Self-pay | Admitting: Family Medicine

## 2019-04-01 DIAGNOSIS — L409 Psoriasis, unspecified: Principal | ICD-10-CM

## 2019-04-01 DIAGNOSIS — M300 Polyarteritis nodosa: Principal | ICD-10-CM

## 2019-04-01 NOTE — Unmapped (Signed)
Per test claim for [facility] at the [facility], patient needs Medication Assistance Program for Prior Authorization.

## 2019-04-02 ENCOUNTER — Ambulatory Visit: Payer: Medicare Other | Admitting: Primary Care

## 2019-04-02 NOTE — Unmapped (Signed)
Called patient to discuss labs were from March and not complete. Need a new set of monitoring labs. No answer. Left message that I was reordering labs to [facility]. Will have clinical staff follow-up to make sure she completes.

## 2019-04-02 NOTE — Unmapped (Signed)
Received labs from Annamarie Major, NP - [name].    Labs were collected on 07/26/2018  AST 19, ALT 14, ALKP 141 (elevated)  %Iron Sat 14 (low)  Hepatitis B SAV 53  Hepatitis B Core negative  HCV negative    Patient told me she had more recent labs but none provided.   Will order labs to [facility] and have staff call her to complete the labs.

## 2019-04-03 NOTE — Unmapped (Signed)
Lab orders entered for [facility]. Called pt to inform. No answer. Also sent MyChart message. Voicemail left to return call or check MyChart messages.

## 2019-04-03 NOTE — Unmapped (Signed)
Addended by: Loreli Slot on: 04/02/2019 04:22 PM     Modules accepted: Orders

## 2019-04-05 NOTE — Unmapped (Signed)
[facility] Specialty Medication Onboarding    Specialty Medication: SKYRIZI  Prior Authorization: Approved   Financial Assistance: No - copay  <$25  Final Copay/Day Supply: $0 / 28 DAYS    Insurance Restrictions: None     Notes to Pharmacist:     The triage team has completed the benefits investigation and has determined that the patient is able to fill this medication at [facility]. Please contact the patient to complete the onboarding or follow up with the prescribing physician as needed.

## 2019-04-05 NOTE — Unmapped (Signed)
Since [name] wanted to get started on her [name] quickly, I counseled to skip Humira dose for next Monday, and begin [employer] on Tues or later.    [facility]   Patient Onboarding/Medication Counseling    Autumn Mcdonald is a 48 y.o. female with psoriasis who I am counseling today on initiation of therapy.  I am speaking to the patient.    Verified patient's date of birth / HIPAA.    Specialty medication(s) to be sent: Inflammatory Disorders: Skyrizi      Non-specialty medications/supplies to be sent: na      Medications not needed at this time: na         Skyrizi (risankizumab)    Medication & Administration     Dosage: Plaque psoriasis: Inject 150mg  under the skin at weeks 0 and 4, then every 12 weeks thereafter    Lab tests required prior to treatment initiation:  ? Tuberculosis: Tuberculosis screening resulted in a non-reactive Quantiferon TB Gold assay.    Administration:     Prefilled syringe  1. Gather all supplies needed for injection on a clean, flat working surface: medication syringe(s) removed from packaging, alcohol swab, sharps container, etc.  2. Look at the medication label ??? look for correct medication, correct dose, and check the expiration date  3. Look at the medication ??? the liquid in the syringe should appear clear and colorless to slightly yellow, you may see tiny white or clear particles  4. Lay the syringe on a flat surface and allow it to warm up to room temperature for at least 30 minutes  5. Select injection site ??? you can use the front of your thigh or your belly (but not the area 2 inches around your belly button); if someone else is giving you the injection you can also use your upper arm in the skin covering your triceps muscle  6. Prepare injection site ??? wash your hands and clean the skin at the injection site with an alcohol swab and let it air dry, do not touch the injection site again before the injection 7. Pull off the needle safety cap, do not remove until immediately prior to injection; turn the syringe so the needle is facing up and hold the syringe at eye level with one hand so you can see the air in the syringe; using your other hand, slowly push the plunger in to push the air out through the needle  8. Pinch the skin ??? with your hand not holding the syringe pinch up a fold of skin at the injection site using your forefinger and thumb  9. Insert the needle into the fold of skin at about a 45 degree angle ??? it's best to use a quick dart-like motion  10. Push the plunger down slowly as far as it will go until the syringe is empty, if the plunger is not fully depressed the needle shield will not extend to cover the needle when it is removed, hold the syringe in place for a full 5 seconds  11. Check that the syringe is empty and keep pressing down on the plunger while you pull the needle out at the same angle as inserted; after the needle is removed completely from the skin, release the plunger allowing the needle shield to activate and cover the used needle  12. Dispose of the used syringe immediately in your sharps disposal container, do not attempt to recap the needle prior to disposing  13. If you see  any blood at the injection site, press a cotton ball or gauze on the site and maintain pressure until the bleeding stops, do not rub the injection site    Adherence/Missed dose instructions:  If your injection is given more than 7 days after your scheduled injection date ??? consult your pharmacist for additional instructions on how to adjust your dosing schedule.    Goals of Therapy     ? Minimize areas of skin involvement (% BSA)  ? Avoidance of long term glucocorticoid use  ? Maintenance of effective psychosocial functioning      Side Effects & Monitoring Parameters     ? Injection site reaction (redness, irritation, inflammation localized to the site of administration) ? Signs of a common cold ??? minor sore throat, runny or stuffy nose, etc.  ? Felling tired/weak  ? Headache    The following side effects should be reported to the provider:  ? Signs of a hypersensitivity reaction ??? rash; hives; itching; red, swollen, blistered, or peeling skin; wheezing; tightness in the chest or throat; difficulty breathing, swallowing, or talking; swelling of the mouth, face, lips, tongue, or throat; etc.  ? Reduced immune function ??? report signs of infection such as fever; chills; body aches; very bad sore throat; ear or sinus pain; cough; more sputum or change in color of sputum; pain with passing urine; wound that will not heal, etc.  Also at a slightly higher risk of some malignancies (mainly skin and blood cancers) due to this reduced immune function.  o In the case of signs of infection ??? the patient should hold the next dose of Skyrizi?? and call your primary care provider to ensure adequate medical care.  Treatment may be resumed when infection is treated and patient is asymptomatic.  ? Flu-like symptoms  ? Warm, red, or painful skin or sores on the body      Contraindications, Warnings, & Precautions     ? Have your bloodwork checked as you have been told by your prescriber  ? Talk with your doctor if you are pregnant, planning to become pregnant, or breastfeeding  ? Discuss the possible need for holding your dose(s) of Skyrizi?? when a planned procedure is scheduled with the prescriber as it may delay healing/recovery timeline       Drug/Food Interactions     ? Medication list reviewed in Epic. The patient was instructed to inform the care team before taking any new medications or supplements. No drug interactions identified.   ? Talk with you prescriber or pharmacist before receiving any live vaccinations while taking this medication and after you stop taking it    Storage, Handling Precautions, & Disposal     ? Store this medication in the refrigerator.  Do not freeze ? If needed, you may store at room temperature for up to 24 hours  ? Store in [employer], protected from light  ? Do not shake  ? Dispose of used syringes/pens in a sharps disposal container            Current Medications (including OTC/herbals), Comorbidities and Allergies     Current Outpatient Medications   Medication Sig Dispense Refill   ??? adalimumab (HUMIRA) 40 mg/0.8 mL subcutaneous pen kit Inject the contents of 1 pen (40 mg total) under the skin once a week. 4 each 32   ??? ALPRAZolam (XANAX) 0.5 MG tablet TAKE 1 TABLET BY MOUTH 3 TIMES DAILY AS NEEDED FOR ANXIETY OR SLEEP     ???  ARIPiprazole (ABILIFY) 10 MG tablet Take 10 mg by mouth daily.     ??? armodafinil (NUVIGIL) 250 mg tablet Take 250 mg by mouth three (3) times a day.      ??? busPIRone (BUSPAR) 30 MG tablet Take 30 mg by mouth.     ??? calcipotriene (DOVONOX) 0.005 % ointment Apply topically Two (2) times a day. Mix with halobetasol 60 g 0   ??? clonazePAM (KLONOPIN) 1 MG tablet TAKE 1 TABLET BY MOUTH EVERY DAY     ??? cyclobenzaprine (FLEXERIL) 10 MG tablet Take 10 mg by mouth once as needed.      ??? DULoxetine (CYMBALTA) 60 MG capsule Take 60 mg by mouth daily. 120 mg daily     ??? eletriptan (RELPAX) 40 MG tablet Take 40 mg by mouth.     ??? ferrous sulfate 325 (65 FE) MG tablet Take 1 tablet (325 mg total) by mouth daily with breakfast.  6   ??? furosemide (LASIX) 40 MG tablet Take 40 mg by mouth Two (2) times a day.     ??? gabapentin (NEURONTIN) 600 MG tablet Take 2 tablets (1,200 mg total) by mouth Three (3) times a day. 90 tablet 3   ??? halobetasol (ULTRAVATE) 0.05 % ointment Apply topically two (2) times a day as needed. To red scaly areas until improved 50 g 2   ??? HYDROcodone-acetaminophen (NORCO 10-325) 10-325 mg per tablet Take 1 tablet by mouth once as needed.      ??? nebivolol (BYSTOLIC) 10 MG tablet Take 10 mg by mouth daily.      ??? OXcarbazepine (TRILEPTAL) 150 MG tablet TAKE 1 TABLET BY MOUTH 2 TIMES DAILY  0 ??? OXcarbazepine (TRILEPTAL) 300 MG tablet Take 300 mg by mouth.     ??? pantoprazole (PROTONIX) 40 MG tablet Take 40 mg by mouth Two (2) times a day. bid     ??? pentoxifylline (TRENTAL) 400 mg CR tablet TAKE 1 TABLET BY MOUTH 3 TIMES DAILY WITH A MEAL 90 tablet 2   ??? promethazine (PHENERGAN) 25 MG tablet once as needed.      ??? risankizumab-rzaa (SKYRIZI) 150mg /1.52mL(75 mg/0.83 mL x2) SyKt Inject the contents of two syringes (150 mg total) under the skin at weeks 0, 4, and then every 12 weeks thereafter. 1 each 6   ??? rosuvastatin (CRESTOR) 20 MG tablet TAKE 1 TABLET BY MOUTH EVERY DAY       No current facility-administered medications for this visit.        Allergies   Allergen Reactions   ??? Iohexol Hives     Pt treated with PO benedryl   ??? Keflex [Cephalexin] Shortness Of Breath and Hives   ??? Lincomycin Hcl Hives   ??? Lisinopril Hives     Hives   ??? Sulfa (Sulfonamide Antibiotics) Swelling and Rash   ??? Sulfamethoxazole-Trimethoprim Hives     REACTION: Rash, throat closed, eyes swelled.  Hives   ??? Aspirin      REACTION: Hives  High Doses only. Causes rash.   ??? Clindamycin      Hives   ??? Doxycycline      REACTION: Hives  Hives   ??? Naproxen      REACTION: Hives  Eyes well   ??? Niacin      REACTION: Swelling, problems breathing   ??? Benzoin Compound Rash   ??? Pneumococcal 23-Val Ps Vaccine Rash     cellulitis       Patient Active Problem List   Diagnosis   ???  Anxiety   ??? History of surgical procedure   ??? Degeneration of intervertebral disc   ??? Muscle pain   ??? Cutaneous fungal infection   ??? Gastroesophageal reflux disease   ??? Hyperlipidemia   ??? Hypertension   ??? Migraine headache   ??? Morbid obesity (CMS-HCC)   ??? [name] (obstructive sleep apnea)   ??? Psoriasis   ??? PAN (polyarteritis nodosa) (CMS-HCC)   ??? DJD (degenerative joint disease)   ??? Fibromyalgia   ??? Neuropathy   ??? Acute blood loss anemia   ??? Adverse drug reaction   ??? Bleeding stomach ulcer   ??? BRBPR (bright red blood per rectum)   ??? Chronic gout of multiple sites ??? Chronic narcotic use   ??? Contracture of tendon   ??? Elevated blood sugar   ??? Other malaise and fatigue   ??? Frequent urination   ??? Melena   ??? Leg pain, lateral   ??? Leg pain, right   ??? Pain in joint, lower leg   ??? Ingrowing nail   ??? Other abnormal glucose       Reviewed and up to date in Epic.    Appropriateness of Therapy     Is medication and dose appropriate based on diagnosis? Yes    Baseline Quality of Life Assessment      How many days over the past month did your psoriasis keep you from your normal activities? 0    Financial Information     Medication Assistance provided: Prior Authorization    Anticipated copay of $0 reviewed with patient. Verified delivery address.    Delivery Information     Scheduled delivery date: Tues, Nov 24     Expected start date: Tues, Nov 24    Medication will be delivered via UPS to the prescription address in [facility].  This shipment will not require a signature.      Explained the services we provide at [facility] and that each month we would call to set up refills.  Stressed importance of returning phone calls so that we could ensure they receive their medications in time each month.  Informed patient that we should be setting up refills 7-10 days prior to when they will run out of medication.  A pharmacist will reach out to perform a clinical assessment periodically.  Informed patient that a welcome packet and a drug information handout will be sent.      Patient verbalized understanding of the above information as well as how to contact the pharmacy at [phone] option [phone] with any questions/concerns.  The pharmacy is open Monday through Friday 8:30am-4:30pm.  A pharmacist is available 24/7 via pager to answer any clinical questions they may have.    Patient Specific Needs     ? Does the patient have any physical, cognitive, or cultural barriers? No    ? Patient prefers to have medications discussed with  Patient ? Is the patient able to read and understand education materials at a high school level or above? No    ? Patient's primary language is  English     ? Is the patient high risk? No     ? Does the patient require a Care Management Plan? No     ? Does the patient require physician intervention or other additional services (i.e. nutrition, smoking cessation, social work)? No      Autumn Mcdonald A Desiree Lucy [facility] Specialty Pharmacist

## 2019-04-08 ENCOUNTER — Other Ambulatory Visit: Payer: Self-pay | Admitting: Family Medicine

## 2019-04-08 DIAGNOSIS — F518 Other sleep disorders not due to a substance or known physiological condition: Secondary | ICD-10-CM | POA: Diagnosis not present

## 2019-04-08 DIAGNOSIS — F4321 Adjustment disorder with depressed mood: Secondary | ICD-10-CM | POA: Diagnosis not present

## 2019-04-08 DIAGNOSIS — F3341 Major depressive disorder, recurrent, in partial remission: Secondary | ICD-10-CM | POA: Diagnosis not present

## 2019-04-08 MED FILL — SKYRIZI 150 MG/1.66 ML(75 MG/0.83 ML X 2) SUBCUTANEOUS SYRINGE KIT: 28 days supply | Qty: 1 | Fill #0 | Status: AC

## 2019-04-08 MED FILL — SKYRIZI 150 MG/1.66 ML(75 MG/0.83 ML X 2) SUBCUTANEOUS SYRINGE KIT: 28 days supply | Qty: 1 | Fill #0

## 2019-04-10 DIAGNOSIS — M300 Polyarteritis nodosa: Secondary | ICD-10-CM | POA: Diagnosis not present

## 2019-04-11 LAB — CBC W/ DIFFERENTIAL
BANDED NEUTROPHILS ABSOLUTE COUNT: 0.1 10*3/uL (ref 0.0–0.1)
BASOPHILS ABSOLUTE COUNT: 0.1 10*3/uL (ref 0.0–0.2)
BASOPHILS RELATIVE PERCENT: 1 %
EOSINOPHILS ABSOLUTE COUNT: 0.4 10*3/uL (ref 0.0–0.4)
EOSINOPHILS RELATIVE PERCENT: 5 %
HEMATOCRIT: 35.1 % (ref 34.0–46.6)
HEMOGLOBIN: 11.7 g/dL (ref 11.1–15.9)
LYMPHOCYTES ABSOLUTE COUNT: 2.3 10*3/uL (ref 0.7–3.1)
LYMPHOCYTES RELATIVE PERCENT: 27 %
MEAN CORPUSCULAR HEMOGLOBIN: 29.9 pg (ref 26.6–33.0)
MEAN CORPUSCULAR VOLUME: 90 fL (ref 79–97)
MONOCYTES ABSOLUTE COUNT: 0.8 10*3/uL (ref 0.1–0.9)
MONOCYTES RELATIVE PERCENT: 9 %
NEUTROPHILS ABSOLUTE COUNT: 4.8 10*3/uL (ref 1.4–7.0)
NEUTROPHILS RELATIVE PERCENT: 57 %
PLATELET COUNT: 287 10*3/uL (ref 150–450)
RED BLOOD CELL COUNT: 3.91 x10E6/uL (ref 3.77–5.28)
RED CELL DISTRIBUTION WIDTH: 12.9 % (ref 11.7–15.4)
WHITE BLOOD CELL COUNT: 8.3 10*3/uL (ref 3.4–10.8)

## 2019-04-11 LAB — COMPREHENSIVE METABOLIC PANEL
ALBUMIN: 4 g/dL (ref 3.8–4.8)
ALKALINE PHOSPHATASE: 132 IU/L — ABNORMAL HIGH (ref 39–117)
ALT (SGPT): 12 IU/L (ref 0–32)
AST (SGOT): 14 IU/L (ref 0–40)
BILIRUBIN TOTAL: 0.2 mg/dL (ref 0.0–1.2)
BLOOD UREA NITROGEN: 7 mg/dL (ref 6–24)
BUN / CREAT RATIO: 11 (ref 9–23)
CALCIUM: 8.7 mg/dL (ref 8.7–10.2)
CHLORIDE: 89 mmol/L — ABNORMAL LOW (ref 96–106)
CO2: 22 mmol/L (ref 20–29)
CREATININE: 0.61 mg/dL (ref 0.57–1.00)
GFR MDRD NON AF AMER: 107 mL/min/{1.73_m2}
GLUCOSE: 90 mg/dL (ref 65–99)
POTASSIUM: 4.1 mmol/L (ref 3.5–5.2)
SODIUM: 127 mmol/L — ABNORMAL LOW (ref 134–144)
TOTAL PROTEIN: 6.5 g/dL (ref 6.0–8.5)

## 2019-04-11 LAB — ERYTHROCYTE SEDIMENTATION RATE: Erythrocyte sedimentation rate:Vel:Pt:Bld:Qn:Westergren: 41 — ABNORMAL HIGH

## 2019-04-11 LAB — A/G RATIO: Albumin/Globulin:MRto:Pt:Ser/Plas:Qn:: 1.6

## 2019-04-11 LAB — C-REACTIVE PROTEIN: C reactive protein:MCnc:Pt:Ser/Plas:Qn:: 6

## 2019-04-11 LAB — PLATELET COUNT: Platelets:NCnc:Pt:Bld:Qn:Automated count: 287

## 2019-04-15 LAB — BASIC METABOLIC PANEL
BUN: 7 (ref 4–21)
Creatinine: 0.6 (ref 0.5–1.1)
Glucose: 90
Potassium: 4.1 (ref 3.4–5.3)
Sodium: 127 — AB (ref 137–147)

## 2019-04-15 LAB — HEPATIC FUNCTION PANEL
ALT: 12 (ref 7–35)
AST: 14 (ref 13–35)
Alkaline Phosphatase: 132 — AB (ref 25–125)
Bilirubin, Total: 0.2

## 2019-04-15 LAB — CBC AND DIFFERENTIAL
HCT: 35 — AB (ref 36–46)
Hemoglobin: 11.7 — AB (ref 12.0–16.0)
Neutrophils Absolute: 5
Platelets: 287 (ref 150–399)
WBC: 8.3

## 2019-04-15 LAB — CBC: RBC: 3.91 (ref 3.87–5.11)

## 2019-04-16 NOTE — Unmapped (Signed)
Last seen October 2020. Requesting refill of pentoifylline. Pended.

## 2019-04-18 ENCOUNTER — Encounter: Payer: Self-pay | Admitting: Family Medicine

## 2019-04-22 ENCOUNTER — Encounter: Payer: Self-pay | Admitting: Family Medicine

## 2019-04-23 ENCOUNTER — Encounter: Payer: Self-pay | Admitting: Family Medicine

## 2019-04-24 ENCOUNTER — Ambulatory Visit (INDEPENDENT_AMBULATORY_CARE_PROVIDER_SITE_OTHER): Payer: Medicare Other | Admitting: Family Medicine

## 2019-04-24 ENCOUNTER — Other Ambulatory Visit: Payer: Self-pay

## 2019-04-24 ENCOUNTER — Telehealth: Payer: Medicare Other | Admitting: Nurse Practitioner

## 2019-04-24 ENCOUNTER — Encounter: Payer: Self-pay | Admitting: Family Medicine

## 2019-04-24 VITALS — BP 130/80 | HR 64 | Temp 97.3°F

## 2019-04-24 DIAGNOSIS — F411 Generalized anxiety disorder: Secondary | ICD-10-CM | POA: Diagnosis not present

## 2019-04-24 MED ORDER — DIAZEPAM 5 MG PO TABS: ORAL_TABLET | ORAL | 0 refills | Status: DC

## 2019-04-24 NOTE — Progress Notes (Signed)
I have discussed the procedure for the virtual visit with the patient who has given consent to proceed with assessment and treatment.   Kenzie Thoreson L Macil Crady, CMA     

## 2019-04-24 NOTE — Progress Notes (Signed)
Virtual Visit via Video   I connected with patient on 04/24/19 at  4:00 PM EST by a video enabled telemedicine application and verified that I am speaking with the correct person using two identifiers.  Location patient: Home Location provider: Acupuncturist, Office Persons participating in the virtual visit: Patient, Provider, [place] (Jess B)  I discussed the limitations of evaluation and management by telemedicine and the availability of in person appointments. The patient expressed understanding and agreed to proceed.  Subjective:   HPI:  Anxiety- pt is on Cymbalta 60mg  BID, Buspar 30mg  BID, and Alprazolam 0.5mg  TID.  Takes Clonazepam 1mg  QHS.  'my nerves are just tore up'.  Elderly father is in the hospital and hallucinating, trying to get out of bed and leave.  Mom passed away 4 months ago.  Stopped the Xanax b/c it was ineffective.  She is really struggling and 'I just don't know what to do'.  ROS:   See pertinent positives and negatives per HPI.  Patient Active Problem List   Diagnosis Date Noted  . Urinary urgency 04/04/2018  . Chronic narcotic use 09/07/2016  . Acute blood loss anemia 01/14/2016  . Multiple gastric ulcers   . PAN (polyarteritis nodosa) ([place]) 11/24/2015  . Chronic gout of multiple sites 02/25/2014  . GERD (gastroesophageal reflux disease) 01/08/2014  . Routine general medical examination at a health care facility 04/23/2013  . Bariatric surgery status 10/31/2012  . Psoriasis 01/09/2012  . DDD (degenerative disc disease), lumbar 11/30/2011  . Migraine headache   . [name] (obstructive sleep apnea) 11/16/2010  . HYPERGLYCEMIA, FASTING 10/08/2009  . CONTRACTURE OF TENDON 08/17/2009  . PARESTHESIA, HANDS 12/23/2008  . Leg pain, right 11/05/2008  . ADVERSE DRUG REACTION 04/03/2008  . PERIPHERAL EDEMA 03/26/2008  . Morbid obesity ([place]) 01/03/2007  . Hyperlipidemia 09/22/2006  . Fibromyalgia 09/20/2006  . Anxiety state 06/28/2006    Social History   Tobacco Use  . Smoking status: Never Smoker  . Smokeless tobacco: Never Used  Substance Use Topics  . Alcohol use: No    Current Outpatient Medications:  .  ARIPiprazole (ABILIFY) 10 MG tablet, Take 10 mg by mouth daily., Disp: , Rfl:  .  Armodafinil 250 MG tablet, Take by mouth., Disp: , Rfl:  .  BIOTIN PO, Take 1 tablet by mouth daily., Disp: , Rfl:  .  busPIRone (BUSPAR) 30 MG tablet, TAKE 1 TABLET BY MOUTH 2 TIMES DAILY, Disp: 60 tablet, Rfl: 3 .  BYSTOLIC 10 MG tablet, TAKE 1 TABLET BY MOUTH EVERY DAY, Disp: 90 tablet, Rfl: 0 .  calcium citrate (CALCITRATE - DOSED IN MG ELEMENTAL CALCIUM) 950 MG tablet, Take 1 tablet by mouth daily., Disp: , Rfl:  .  clonazePAM (KLONOPIN) 1 MG tablet, TAKE 1 TABLET BY MOUTH EVERY DAY, Disp: 30 tablet, Rfl: 3 .  clotrimazole-betamethasone (LOTRISONE) cream, Apply 1 application topically 2 (two) times daily., Disp: 30 g, Rfl: 0 .  cyclobenzaprine (FLEXERIL) 10 MG tablet, TAKE 1 TABLET BY MOUTH 3 TIMES DAILY AS NEEDED, Disp: 270 tablet, Rfl: 0 .  DULoxetine (CYMBALTA) 60 MG capsule, Take 2 capsules (120 mg total) by mouth daily., Disp: 240 capsule, Rfl: 2 .  eletriptan (RELPAX) 40 MG tablet, Take 1 tablet (40 mg total) by mouth as needed for migraine. may repeat in 2 hours if necessary, Disp: 15 tablet, Rfl: 3 .  FEROSUL 325 (65 Fe) MG tablet, TAKE 1 TABLET BY MOUTH EVERY DAY WITH BREAKFAST, Disp: 30 tablet, Rfl: 6 .  fluconazole (  DIFLUCAN) 100 MG tablet, Take 1 tablet (100 mg total) by mouth daily., Disp: 7 tablet, Rfl: 0 .  furosemide (LASIX) 40 MG tablet, TAKE 2 AND 1/2 TABLETS BY MOUTH EVERY DAY, Disp: 225 tablet, Rfl: 0 .  gabapentin (NEURONTIN) 600 MG tablet, Take 1,200 mg by mouth 3 (three) times daily., Disp: , Rfl:  .  halobetasol (ULTRAVATE) 0.05 % cream, Apply topically 2 (two) times daily., Disp: 50 g, Rfl: 0 .  HYDROcodone-acetaminophen (NORCO) 10-325 MG tablet, TAKE 1 TABLET BY MOUTH EVERY 6 HOURS AS NEEDED, Disp: 90 tablet, Rfl: 0 .   meclizine (ANTIVERT) 25 MG tablet, Take 1 tablet (25 mg total) by mouth 3 (three) times daily as needed for dizziness., Disp: 30 tablet, Rfl: 0 .  NYSTATIN powder, APPLY 4 grams TO THE AFFECTED AREA 4 TIMES DAILY, Disp: 60 g, Rfl: 3 .  Oxcarbazepine (TRILEPTAL) 300 MG tablet, Take 300 mg by mouth 2 (two) times daily., Disp: , Rfl:  .  pantoprazole (PROTONIX) 40 MG tablet, TAKE 1 TABLET BY MOUTH 2 TIMES DAILY, Disp: 180 tablet, Rfl: 1 .  pentoxifylline (TRENTAL) 400 MG CR tablet, TAKE 1 TABLET BY MOUTH 3 TIMES DAILY WITH A MEAL, Disp: , Rfl: 11 .  promethazine (PHENERGAN) 25 MG tablet, TAKE 1 TABLET BY MOUTH EVERY 6 HOURS AS NEEDED FOR NAUSEA AND VOMITING, Disp: 45 tablet, Rfl: 3 .  rosuvastatin (CRESTOR) 20 MG tablet, TAKE 1 TABLET BY MOUTH EVERY DAY, Disp: 90 tablet, Rfl: 1 .  SKYRIZI, 150 MG DOSE, 75 MG/0.83ML PSKT, , Disp: , Rfl:  .  ALPRAZolam (XANAX) 0.5 MG tablet, TAKE 1 TABLET BY MOUTH 3 TIMES DAILY AS NEEDED FOR ANXIETY OR SLEEP (Patient not taking: Reported on 04/24/2019), Disp: 90 tablet, Rfl: 3  Allergies  Allergen Reactions  . Niacin Other (See Comments)    REACTION: Swelling, problems breathing  . Sulfamethoxazole-Trimethoprim Other (See Comments)    REACTION: Rash, throat closed, eyes swelled.  . Aspirin Hives  . Bactrim Hives  . Benzoin Compound Rash  . Cephalexin Hives  . Clindamycin/Lincomycin Hives  . Doxycycline Hives  . Iohexol Hives    Pt treated with PO benedryl  . Lisinopril Hives  . Naproxen Hives  . Pnu-Imune [Pneumococcal Polysaccharide Vaccine] Rash  . Sulfonamide Derivatives Rash    Objective:   BP 130/80   Pulse 64   Temp (!) 97.3 F (36.3 C) (Tympanic)  AAOx3, NAD NCAT, EOMI obese No obvious CN deficits Multiple teeth missing Coloring WNL Pt is able to speak clearly, coherently without shortness of breath or increased work of breathing.  Thought process is linear.  Mood is appropriate.   Assessment and Plan:   Anxiety- deteriorated.  Pt is  having a difficult time w/ mom's recent death and the holiday season.  Dad is now in the hospital and not doing well.  She is not able to see him due to her own health issues and immunosuppressive medications.  Since the Alprazolam was not effective, she stopped taking it.  Will switch to Valium and monitor for improvement.  Pt expressed understanding and is in agreement w/ plan.   [name], MD 04/24/2019

## 2019-04-24 NOTE — Progress Notes (Signed)
As not ure what patient wanted so I contacted her by phone and she was actually trying to check into a video visit that she had scheduled at 4pm to day. She wanted to speak with her PCP about changeig some of her anxiety medications.

## 2019-04-25 NOTE — Unmapped (Signed)
Autumn Mcdonald reports seeing some improvement in her psoriasis since starting Skyrizi on 11/27. No adverse effects.    Her week 4 dose will be on 12/25, then she'll start maintenance dosing on 08/02/19.     [facility] Clinical Assessment & Refill Coordination Note    Autumn Mcdonald, DOB: December 10, 1970  Phone: [phone] (home)     All above HIPAA information was verified with patient.     Was a Nurse, learning disability used for this call? No    Specialty Medication(s):   Inflammatory Disorders: Skyrizi     Current Outpatient Medications   Medication Sig Dispense Refill   ??? adalimumab (HUMIRA) 40 mg/0.8 mL subcutaneous pen kit Inject the contents of 1 pen (40 mg total) under the skin once a week. 4 each 32   ??? ALPRAZolam (XANAX) 0.5 MG tablet TAKE 1 TABLET BY MOUTH 3 TIMES DAILY AS NEEDED FOR ANXIETY OR SLEEP     ??? ARIPiprazole (ABILIFY) 10 MG tablet Take 10 mg by mouth daily.     ??? armodafinil (NUVIGIL) 250 mg tablet Take 250 mg by mouth three (3) times a day.      ??? busPIRone (BUSPAR) 30 MG tablet Take 30 mg by mouth.     ??? calcipotriene (DOVONOX) 0.005 % ointment Apply topically Two (2) times a day. Mix with halobetasol 60 g 0   ??? clonazePAM (KLONOPIN) 1 MG tablet TAKE 1 TABLET BY MOUTH EVERY DAY     ??? cyclobenzaprine (FLEXERIL) 10 MG tablet Take 10 mg by mouth once as needed.      ??? DULoxetine (CYMBALTA) 60 MG capsule Take 60 mg by mouth daily. 120 mg daily     ??? eletriptan (RELPAX) 40 MG tablet Take 40 mg by mouth.     ??? ferrous sulfate 325 (65 FE) MG tablet Take 1 tablet (325 mg total) by mouth daily with breakfast.  6   ??? furosemide (LASIX) 40 MG tablet Take 40 mg by mouth Two (2) times a day.     ??? gabapentin (NEURONTIN) 600 MG tablet Take 2 tablets (1,200 mg total) by mouth Three (3) times a day. 90 tablet 3   ??? HYDROcodone-acetaminophen (NORCO 10-325) 10-325 mg per tablet Take 1 tablet by mouth once as needed.      ??? nebivolol (BYSTOLIC) 10 MG tablet Take 10 mg by mouth daily. ??? OXcarbazepine (TRILEPTAL) 150 MG tablet TAKE 1 TABLET BY MOUTH 2 TIMES DAILY  0   ??? OXcarbazepine (TRILEPTAL) 300 MG tablet Take 300 mg by mouth.     ??? pantoprazole (PROTONIX) 40 MG tablet Take 40 mg by mouth Two (2) times a day. bid     ??? pentoxifylline (TRENTAL) 400 mg CR tablet TAKE 1 TABLET BY MOUTH 3 TIMES DAILY WITH MEALS 90 tablet 2   ??? promethazine (PHENERGAN) 25 MG tablet once as needed.      ??? risankizumab-rzaa (SKYRIZI) 150mg /1.51mL(75 mg/0.83 mL x2) SyKt Inject the contents of two syringes (150 mg total) under the skin at weeks 0, 4, and then every 12 weeks thereafter. 1 each 6   ??? rosuvastatin (CRESTOR) 20 MG tablet TAKE 1 TABLET BY MOUTH EVERY DAY       No current facility-administered medications for this visit.         Changes to medications: Autumn Mcdonald reports no changes at this time.    Allergies   Allergen Reactions   ??? Iohexol Hives     Pt treated with PO benedryl   ???  Keflex [Cephalexin] Shortness Of Breath and Hives   ??? Lincomycin Hcl Hives   ??? Lisinopril Hives     Hives   ??? Sulfa (Sulfonamide Antibiotics) Swelling and Rash   ??? Sulfamethoxazole-Trimethoprim Hives     REACTION: Rash, throat closed, eyes swelled.  Hives   ??? Aspirin      REACTION: Hives  High Doses only. Causes rash.   ??? Clindamycin      Hives   ??? Doxycycline      REACTION: Hives  Hives   ??? Naproxen      REACTION: Hives  Eyes well   ??? Niacin      REACTION: Swelling, problems breathing   ??? Benzoin Compound Rash   ??? Pneumococcal 23-Val Ps Vaccine Rash     cellulitis       Changes to allergies: No    SPECIALTY MEDICATION ADHERENCE     Skyrizi - 0 left  Medication Adherence    Patient reported X missed doses in the last month: 0  Specialty Medication: Skyrizi  Patient is on additional specialty medications: No          Specialty medication(s) dose(s) confirmed: Regimen is correct and unchanged.     Are there any concerns with adherence? No    Adherence counseling provided? Not needed    CLINICAL MANAGEMENT AND INTERVENTION Clinical Benefit Assessment:    Do you feel the medicine is effective or helping your condition? Yes    Clinical Benefit counseling provided? Not needed    Adverse Effects Assessment:    Are you experiencing any side effects? No    Are you experiencing difficulty administering your medicine? No    Quality of Life Assessment:    How many days over the past month did your psoriasis  keep you from your normal activities? For example, brushing your teeth or getting up in the morning. 0    Have you discussed this with your provider? Not needed    Therapy Appropriateness:    Is therapy appropriate? Yes, therapy is appropriate and should be continued    DISEASE/MEDICATION-SPECIFIC INFORMATION      For patients on injectable medications: Patient currently has 0 doses left.  Next injection is scheduled for 12/25.    PATIENT SPECIFIC NEEDS     ? Does the patient have any physical, cognitive, or cultural barriers? No    ? Is the patient high risk? No     ? Does the patient require a Care Management Plan? No     ? Does the patient require physician intervention or other additional services (i.e. nutrition, smoking cessation, social work)? No      SHIPPING     Specialty Medication(s) to be Shipped:   Inflammatory Disorders: Skyrizi    Other medication(s) to be shipped: na     Changes to insurance: No    Delivery Scheduled: Yes, Expected medication delivery date: Thurs, 12/17.     Medication will be delivered via UPS to the confirmed prescription address in [facility].    The patient will receive a drug information handout for each medication shipped and additional FDA Medication Guides as required.  Verified that patient has previously received a Conservation officer, historic buildings.    All of the patient's questions and concerns have been addressed.    [name]   [facility] Specialty Pharmacist

## 2019-04-26 ENCOUNTER — Other Ambulatory Visit: Payer: Self-pay | Admitting: Family Medicine

## 2019-04-26 MED ORDER — HYDROCODONE-ACETAMINOPHEN 10-325 MG PO TABS: 1.0000 | ORAL_TABLET | Freq: Four times a day (QID) | ORAL | 0 refills | Status: DC | PRN

## 2019-04-26 NOTE — Telephone Encounter (Signed)
Last OV 04/24/19 Hydrocodone last filled 04/01/19 #90 with 0

## 2019-04-29 ENCOUNTER — Other Ambulatory Visit: Payer: Self-pay | Admitting: Family Medicine

## 2019-04-30 ENCOUNTER — Encounter: Payer: Self-pay | Admitting: General Practice

## 2019-05-01 MED FILL — SKYRIZI 150 MG/1.66 ML(75 MG/0.83 ML X 2) SUBCUTANEOUS SYRINGE KIT: 84 days supply | Qty: 1 | Fill #1 | Status: AC

## 2019-05-01 MED FILL — SKYRIZI 150 MG/1.66 ML(75 MG/0.83 ML X 2) SUBCUTANEOUS SYRINGE KIT: 84 days supply | Qty: 1 | Fill #1

## 2019-05-02 ENCOUNTER — Encounter: Payer: Self-pay | Admitting: Family Medicine

## 2019-05-02 ENCOUNTER — Ambulatory Visit (INDEPENDENT_AMBULATORY_CARE_PROVIDER_SITE_OTHER): Payer: Medicare Other | Admitting: Family Medicine

## 2019-05-02 ENCOUNTER — Other Ambulatory Visit: Payer: Self-pay

## 2019-05-02 DIAGNOSIS — F329 Major depressive disorder, single episode, unspecified: Secondary | ICD-10-CM | POA: Diagnosis not present

## 2019-05-02 DIAGNOSIS — F419 Anxiety disorder, unspecified: Secondary | ICD-10-CM

## 2019-05-02 MED ORDER — BUPROPION HCL ER (XL) 150 MG PO TB24: 150.0000 mg | ORAL_TABLET | Freq: Every day | ORAL | 3 refills | Status: DC

## 2019-05-02 NOTE — Progress Notes (Signed)
I have discussed the procedure for the virtual visit with the patient who has given consent to proceed with assessment and treatment.   Pt unable to obtain vitals. [facility] just left the home. They were there checking out her dad since his pulse was at 66.   Davis Gourd, CMA

## 2019-05-02 NOTE — Progress Notes (Signed)
Virtual Visit via Video   I connected with patient on 05/02/19 at  2:00 PM EST by a video enabled telemedicine application and verified that I am speaking with the correct person using two identifiers.  Location patient: Home Location provider: Acupuncturist, Office Persons participating in the virtual visit: Patient, Provider, [place] (Jess B)  I discussed the limitations of evaluation and management by telemedicine and the availability of in person appointments. The patient expressed understanding and agreed to proceed.  Subjective:   HPI:   Anxiety/Depression- dad was just released from hospital on palliative care after a 10 day stay.  They encouraged the family to pursue SNF but they declined.  She reports she is not sleeping, is overwhelmed.  His O2 level dropped to 66% today and EMS was called. On 12/9 we switched her Xanax to Valium b/c the Xanax was ineffective.  She is on Cymbalta 60mg  BID and Buspar 30mg  BID.  'I can't sleep, I can't eat'.  ROS:   See pertinent positives and negatives per HPI.  Patient Active Problem List   Diagnosis Date Noted  . Urinary urgency 04/04/2018  . Chronic narcotic use 09/07/2016  . Acute blood loss anemia 01/14/2016  . Multiple gastric ulcers   . PAN (polyarteritis nodosa) ([place]) 11/24/2015  . Chronic gout of multiple sites 02/25/2014  . GERD (gastroesophageal reflux disease) 01/08/2014  . Routine general medical examination at a health care facility 04/23/2013  . Bariatric surgery status 10/31/2012  . Psoriasis 01/09/2012  . DDD (degenerative disc disease), lumbar 11/30/2011  . Migraine headache   . [name] (obstructive sleep apnea) 11/16/2010  . HYPERGLYCEMIA, FASTING 10/08/2009  . CONTRACTURE OF TENDON 08/17/2009  . PARESTHESIA, HANDS 12/23/2008  . Leg pain, right 11/05/2008  . ADVERSE DRUG REACTION 04/03/2008  . PERIPHERAL EDEMA 03/26/2008  . Morbid obesity ([place]) 01/03/2007  . Hyperlipidemia 09/22/2006  . Fibromyalgia 09/20/2006   . Anxiety state 06/28/2006    Social History   Tobacco Use  . Smoking status: Never Smoker  . Smokeless tobacco: Never Used  Substance Use Topics  . Alcohol use: No    Current Outpatient Medications:  .  ARIPiprazole (ABILIFY) 10 MG tablet, Take 10 mg by mouth daily., Disp: , Rfl:  .  Armodafinil 250 MG tablet, Take by mouth., Disp: , Rfl:  .  BIOTIN PO, Take 1 tablet by mouth daily., Disp: , Rfl:  .  busPIRone (BUSPAR) 30 MG tablet, TAKE 1 TABLET BY MOUTH 2 TIMES DAILY, Disp: 60 tablet, Rfl: 3 .  BYSTOLIC 10 MG tablet, TAKE 1 TABLET BY MOUTH EVERY DAY, Disp: 90 tablet, Rfl: 0 .  calcium citrate (CALCITRATE - DOSED IN MG ELEMENTAL CALCIUM) 950 MG tablet, Take 1 tablet by mouth daily., Disp: , Rfl:  .  clonazePAM (KLONOPIN) 1 MG tablet, TAKE 1 TABLET BY MOUTH EVERY DAY, Disp: 30 tablet, Rfl: 3 .  clotrimazole-betamethasone (LOTRISONE) cream, Apply 1 application topically 2 (two) times daily., Disp: 30 g, Rfl: 0 .  cyclobenzaprine (FLEXERIL) 10 MG tablet, TAKE 1 TABLET BY MOUTH 3 TIMES DAILY AS NEEDED, Disp: 270 tablet, Rfl: 0 .  diazepam (VALIUM) 5 MG tablet, 1/2-1 tab PO BID prn anxiety, Disp: 60 tablet, Rfl: 0 .  DULoxetine (CYMBALTA) 60 MG capsule, Take 2 capsules (120 mg total) by mouth daily., Disp: 240 capsule, Rfl: 2 .  eletriptan (RELPAX) 40 MG tablet, Take 1 tablet (40 mg total) by mouth as needed for migraine. may repeat in 2 hours if necessary, Disp: 15 tablet,  Rfl: 3 .  FEROSUL 325 (65 Fe) MG tablet, TAKE 1 TABLET BY MOUTH EVERY DAY WITH BREAKFAST, Disp: 30 tablet, Rfl: 6 .  fluconazole (DIFLUCAN) 100 MG tablet, Take 1 tablet (100 mg total) by mouth daily., Disp: 7 tablet, Rfl: 0 .  furosemide (LASIX) 40 MG tablet, TAKE 2 AND 1/2 TABLETS BY MOUTH EVERY DAY, Disp: 225 tablet, Rfl: 0 .  gabapentin (NEURONTIN) 600 MG tablet, Take 1,200 mg by mouth 3 (three) times daily., Disp: , Rfl:  .  halobetasol (ULTRAVATE) 0.05 % cream, Apply topically 2 (two) times daily., Disp: 50 g, Rfl:  0 .  HYDROcodone-acetaminophen (NORCO) 10-325 MG tablet, Take 1 tablet by mouth every 6 (six) hours as needed., Disp: 90 tablet, Rfl: 0 .  meclizine (ANTIVERT) 25 MG tablet, Take 1 tablet (25 mg total) by mouth 3 (three) times daily as needed for dizziness., Disp: 30 tablet, Rfl: 0 .  NYSTATIN powder, APPLY 4 grams TO THE AFFECTED AREA 4 TIMES DAILY, Disp: 60 g, Rfl: 3 .  Oxcarbazepine (TRILEPTAL) 300 MG tablet, Take 300 mg by mouth 2 (two) times daily., Disp: , Rfl:  .  pantoprazole (PROTONIX) 40 MG tablet, TAKE 1 TABLET BY MOUTH 2 TIMES DAILY, Disp: 180 tablet, Rfl: 1 .  pentoxifylline (TRENTAL) 400 MG CR tablet, TAKE 1 TABLET BY MOUTH 3 TIMES DAILY WITH A MEAL, Disp: , Rfl: 11 .  promethazine (PHENERGAN) 25 MG tablet, TAKE 1 TABLET BY MOUTH EVERY 6 HOURS AS NEEDED FOR NAUSEA AND VOMITING, Disp: 45 tablet, Rfl: 3 .  rosuvastatin (CRESTOR) 20 MG tablet, TAKE 1 TABLET BY MOUTH EVERY DAY, Disp: 90 tablet, Rfl: 1 .  SKYRIZI, 150 MG DOSE, 75 MG/0.83ML PSKT, , Disp: , Rfl:  .  ALPRAZolam (XANAX) 0.5 MG tablet, Take 0.5 mg by mouth 3 (three) times daily as needed., Disp: , Rfl:   Allergies  Allergen Reactions  . Niacin Other (See Comments)    REACTION: Swelling, problems breathing  . Sulfamethoxazole-Trimethoprim Other (See Comments)    REACTION: Rash, throat closed, eyes swelled.  . Aspirin Hives  . Bactrim Hives  . Benzoin Compound Rash  . Cephalexin Hives  . Clindamycin/Lincomycin Hives  . Doxycycline Hives  . Iohexol Hives    Pt treated with PO benedryl  . Lisinopril Hives  . Naproxen Hives  . Pnu-Imune [Pneumococcal Polysaccharide Vaccine] Rash  . Sulfonamide Derivatives Rash    Objective:   There were no vitals taken for this visit. AAOx3, appear exhausted NCAT, EOMI No obvious CN deficits Edentulous Coloring WNL Pt is able to speak clearly, coherently without shortness of breath or increased work of breathing.  Thought process is linear.  Mood is appropriate.   Assessment  and Plan:   Anxiety/Depression- deteriorated.  Just 4 months after her mother's death her father is in palliative care.  She is his full time care giver but Palliative Care is coming to establish him as a patient today.  She is already on multiple medications for mood, but will add Wellbutrin daily to try and help her through this difficult time.  Encouraged her to sleep when she was able and that she needed to take care of herself.  Will follow.   [name], MD 05/02/2019

## 2019-05-08 ENCOUNTER — Other Ambulatory Visit: Payer: Self-pay | Admitting: Family Medicine

## 2019-05-13 ENCOUNTER — Encounter: Payer: Self-pay | Admitting: General Practice

## 2019-05-13 ENCOUNTER — Telehealth: Payer: Self-pay

## 2019-05-13 NOTE — Telephone Encounter (Signed)
Called pt, VM was full I could not advise of PCP recommendation. Mychart message sent to advise pt.

## 2019-05-13 NOTE — Telephone Encounter (Signed)
Since I am not prescribing the [facility], she needs to contact her prescriber and let them know she is having side effects and they can make adjustments if needed

## 2019-05-13 NOTE — Telephone Encounter (Signed)
Patient called in stating that after taking the second dose of Skyrizi, she is experiencing nausea and diarrhea. Wanting to know if PCP will send in new script for Phenergan. Please advise

## 2019-05-15 ENCOUNTER — Ambulatory Visit: Payer: Medicare Other | Admitting: Primary Care

## 2019-05-20 DIAGNOSIS — M300 Polyarteritis nodosa: Principal | ICD-10-CM

## 2019-05-20 MED ORDER — GABAPENTIN 600 MG TABLET
ORAL_TABLET | Freq: Three times a day (TID) | ORAL | 3 refills | 15.00000 days | Status: CP
Start: 2019-05-20 — End: ?

## 2019-05-22 ENCOUNTER — Other Ambulatory Visit: Payer: Self-pay | Admitting: Family Medicine

## 2019-05-22 NOTE — Telephone Encounter (Signed)
Last OV 05/02/19 Hydrocodone last filled 04/26/19 #90 with 0

## 2019-05-23 ENCOUNTER — Other Ambulatory Visit: Payer: Self-pay | Admitting: Family Medicine

## 2019-05-23 ENCOUNTER — Encounter: Payer: Medicare Other | Admitting: Gynecology

## 2019-05-24 MED ORDER — DIAZEPAM 5 MG PO TABS: ORAL_TABLET | ORAL | 0 refills | Status: DC

## 2019-05-24 MED ORDER — HYDROCODONE-ACETAMINOPHEN 10-325 MG PO TABS: 1.0000 | ORAL_TABLET | Freq: Four times a day (QID) | ORAL | 0 refills | Status: DC | PRN

## 2019-05-24 NOTE — Telephone Encounter (Signed)
Last OV 05/02/19 Hydrocodone last filled 04/26/19 #90 with 0 Valium last filled 04/24/19 #60 with 0

## 2019-06-04 DIAGNOSIS — G4733 Obstructive sleep apnea (adult) (pediatric): Secondary | ICD-10-CM | POA: Diagnosis not present

## 2019-06-07 ENCOUNTER — Encounter: Payer: Self-pay | Admitting: Family Medicine

## 2019-06-12 DIAGNOSIS — K760 Fatty (change of) liver, not elsewhere classified: Secondary | ICD-10-CM | POA: Diagnosis not present

## 2019-06-13 ENCOUNTER — Encounter: Payer: Self-pay | Admitting: Family Medicine

## 2019-06-14 ENCOUNTER — Other Ambulatory Visit: Payer: Self-pay | Admitting: Family Medicine

## 2019-06-14 ENCOUNTER — Ambulatory Visit: Payer: Medicare Other | Admitting: Primary Care

## 2019-06-16 ENCOUNTER — Other Ambulatory Visit: Payer: Self-pay | Admitting: Family Medicine

## 2019-06-17 ENCOUNTER — Other Ambulatory Visit: Payer: Self-pay | Admitting: Family Medicine

## 2019-06-17 NOTE — Telephone Encounter (Signed)
Last refill: 1.8.21 #90, 0 Last OV: 12.17.20 dx. Anxiety and depression

## 2019-06-17 NOTE — Telephone Encounter (Signed)
Last OV 05/02/19 Hydrocodone last filled 06/17/19 #90 with 0   This is a duplicate it appears.

## 2019-06-21 ENCOUNTER — Ambulatory Visit: Payer: Medicare HMO | Admitting: Primary Care

## 2019-06-25 ENCOUNTER — Other Ambulatory Visit: Payer: Self-pay | Admitting: Family Medicine

## 2019-06-25 NOTE — Telephone Encounter (Signed)
Last OV 05/02/19 Valium last filled 05/24/19 #60 with 0

## 2019-06-26 ENCOUNTER — Other Ambulatory Visit: Payer: Self-pay | Admitting: Family Medicine

## 2019-06-26 NOTE — Telephone Encounter (Signed)
Last refill: 10.20.20 #30, 3 Last OV: 12.17.20 dx. Anxiety and depression

## 2019-07-02 ENCOUNTER — Encounter: Payer: Self-pay | Admitting: Physician Assistant

## 2019-07-02 ENCOUNTER — Other Ambulatory Visit: Payer: Self-pay

## 2019-07-02 ENCOUNTER — Ambulatory Visit (INDEPENDENT_AMBULATORY_CARE_PROVIDER_SITE_OTHER): Payer: Medicare HMO | Admitting: Physician Assistant

## 2019-07-02 DIAGNOSIS — R300 Dysuria: Secondary | ICD-10-CM | POA: Diagnosis not present

## 2019-07-02 MED ORDER — NITROFURANTOIN MONOHYD MACRO 100 MG PO CAPS: 100.0000 mg | ORAL_CAPSULE | Freq: Two times a day (BID) | ORAL | 0 refills | Status: DC

## 2019-07-02 NOTE — Progress Notes (Signed)
Virtual Visit via Video   I connected with patient on 07/02/19 at 11:30 AM EST by a video enabled telemedicine application and verified that I am speaking with the correct person using two identifiers.  Location patient: Home Location provider: [place], Office Persons participating in the virtual visit: Patient, Provider, [place] (Patina Moore)  I discussed the limitations of evaluation and management by telemedicine and the availability of in person appointments. The patient expressed understanding and agreed to proceed.  Subjective:   HPI:   Patient presents via D[employer]Me today c/o 1 day of dysuria, urinary urgency and frequency.  Did note a few days of urinary frequency last week that had seemed to resolve.  Denies fever or chills.  Denies flank pain, back pain, hematuria.  Notes suprapubic pressure.  Denies back or flank pain.  Denies vaginal symptoms.   ROS:   See pertinent positives and negatives per HPI.  Patient Active Problem List   Diagnosis Date Noted  . Urinary urgency 04/04/2018  . Chronic narcotic use 09/07/2016  . Acute blood loss anemia 01/14/2016  . Multiple gastric ulcers   . PAN (polyarteritis nodosa) ([place]) 11/24/2015  . Chronic gout of multiple sites 02/25/2014  . GERD (gastroesophageal reflux disease) 01/08/2014  . Routine general medical examination at a health care facility 04/23/2013  . Bariatric surgery status 10/31/2012  . Psoriasis 01/09/2012  . DDD (degenerative disc disease), lumbar 11/30/2011  . Migraine headache   . [name] (obstructive sleep apnea) 11/16/2010  . HYPERGLYCEMIA, FASTING 10/08/2009  . CONTRACTURE OF TENDON 08/17/2009  . PARESTHESIA, HANDS 12/23/2008  . Leg pain, right 11/05/2008  . ADVERSE DRUG REACTION 04/03/2008  . PERIPHERAL EDEMA 03/26/2008  . Morbid obesity ([place]) 01/03/2007  . Hyperlipidemia 09/22/2006  . Fibromyalgia 09/20/2006  . Anxiety and depression 06/28/2006    Social History   Tobacco Use  . Smoking  status: Never Smoker  . Smokeless tobacco: Never Used  Substance Use Topics  . Alcohol use: No    Current Outpatient Medications:  .  ALPRAZolam (XANAX) 0.5 MG tablet, Take 0.5 mg by mouth 3 (three) times daily as needed., Disp: , Rfl:  .  ARIPiprazole (ABILIFY) 10 MG tablet, Take 10 mg by mouth daily., Disp: , Rfl:  .  Armodafinil 250 MG tablet, Take by mouth., Disp: , Rfl:  .  BIOTIN PO, Take 1 tablet by mouth daily., Disp: , Rfl:  .  buPROPion (WELLBUTRIN XL) 150 MG 24 hr tablet, Take 1 tablet (150 mg total) by mouth daily., Disp: 30 tablet, Rfl: 3 .  busPIRone (BUSPAR) 30 MG tablet, TAKE 1 TABLET BY MOUTH 2 TIMES DAILY, Disp: 60 tablet, Rfl: 3 .  BYSTOLIC 10 MG tablet, TAKE 1 TABLET BY MOUTH EVERY DAY, Disp: 90 tablet, Rfl: 0 .  calcium citrate (CALCITRATE - DOSED IN MG ELEMENTAL CALCIUM) 950 MG tablet, Take 1 tablet by mouth daily., Disp: , Rfl:  .  clonazePAM (KLONOPIN) 1 MG tablet, TAKE 1 TABLET BY MOUTH EVERY DAY, Disp: 30 tablet, Rfl: 3 .  clotrimazole-betamethasone (LOTRISONE) cream, Apply 1 application topically 2 (two) times daily., Disp: 30 g, Rfl: 0 .  cyclobenzaprine (FLEXERIL) 10 MG tablet, TAKE 1 TABLET BY MOUTH 3 TIMES DAILY AS NEEDED, Disp: 270 tablet, Rfl: 0 .  diazepam (VALIUM) 5 MG tablet, TAKE 1/2 TO 1 TABLET BY MOUTH 2 TIMES DAILY AS NEEDED FOR anxiety, Disp: 60 tablet, Rfl: 0 .  DULoxetine (CYMBALTA) 60 MG capsule, Take 2 capsules (120 mg total) by mouth daily., Disp:  240 capsule, Rfl: 2 .  eletriptan (RELPAX) 40 MG tablet, Take 1 tablet (40 mg total) by mouth as needed for migraine. may repeat in 2 hours if necessary, Disp: 15 tablet, Rfl: 3 .  FEROSUL 325 (65 Fe) MG tablet, TAKE 1 TABLET BY MOUTH EVERY DAY WITH BREAKFAST, Disp: 30 tablet, Rfl: 6 .  fluconazole (DIFLUCAN) 100 MG tablet, Take 1 tablet (100 mg total) by mouth daily., Disp: 7 tablet, Rfl: 0 .  furosemide (LASIX) 40 MG tablet, TAKE 2 AND 1/2 TABLETS BY MOUTH EVERY DAY, Disp: 225 tablet, Rfl: 0 .   gabapentin (NEURONTIN) 600 MG tablet, Take 1,200 mg by mouth 3 (three) times daily., Disp: , Rfl:  .  halobetasol (ULTRAVATE) 0.05 % cream, Apply topically 2 (two) times daily., Disp: 50 g, Rfl: 0 .  HYDROcodone-acetaminophen (NORCO) 10-325 MG tablet, TAKE 1 TABLET BY MOUTH EVERY 6 HOURS AS NEEDED, Disp: 90 tablet, Rfl: 0 .  meclizine (ANTIVERT) 25 MG tablet, Take 1 tablet (25 mg total) by mouth 3 (three) times daily as needed for dizziness., Disp: 30 tablet, Rfl: 0 .  NYSTATIN powder, APPLY 4 grams TO THE AFFECTED AREA 4 TIMES DAILY, Disp: 60 g, Rfl: 3 .  Oxcarbazepine (TRILEPTAL) 300 MG tablet, Take 300 mg by mouth 2 (two) times daily., Disp: , Rfl:  .  pantoprazole (PROTONIX) 40 MG tablet, TAKE 1 TABLET BY MOUTH 2 TIMES DAILY, Disp: 180 tablet, Rfl: 1 .  pentoxifylline (TRENTAL) 400 MG CR tablet, TAKE 1 TABLET BY MOUTH 3 TIMES DAILY WITH A MEAL, Disp: , Rfl: 11 .  promethazine (PHENERGAN) 25 MG tablet, TAKE 1 TABLET BY MOUTH EVERY 6 HOURS AS NEEDED FOR NAUSEA AND VOMITING, Disp: 45 tablet, Rfl: 3 .  rosuvastatin (CRESTOR) 20 MG tablet, TAKE 1 TABLET BY MOUTH EVERY DAY, Disp: 90 tablet, Rfl: 1 .  SKYRIZI, 150 MG DOSE, 75 MG/0.83ML PSKT, , Disp: , Rfl:   Allergies  Allergen Reactions  . Niacin Other (See Comments)    REACTION: Swelling, problems breathing  . Sulfamethoxazole-Trimethoprim Other (See Comments)    REACTION: Rash, throat closed, eyes swelled.  . Aspirin Hives  . Bactrim Hives  . Benzoin Compound Rash  . Cephalexin Hives  . Clindamycin/Lincomycin Hives  . Doxycycline Hives  . Iohexol Hives    Pt treated with PO benedryl  . Lisinopril Hives  . Naproxen Hives  . Pnu-Imune [Pneumococcal Polysaccharide Vaccine] Rash  . Sulfonamide Derivatives Rash    Objective:   There were no vitals taken for this visit.  Patient is well-developed, well-nourished in no acute distress.  Resting comfortably at home.  Head is normocephalic, atraumatic.  No labored breathing.  Speech is  clear and coherent with logical content.  Patient is alert and oriented at baseline.   Assessment and Plan:   1. Dysuria Having typical symptoms of UTI.  States this feels similar to past.  Thankfully no alarm signs or symptoms.  Feel we can treat via video visit without urine culture.  Patient with multi drug allergies.  Will start Macrobid 100 mg twice daily x5 days.  Supportive measures and OTC medications reviewed she is to follow-up in clinic if symptoms not resolving.    Leeanne Rio, PA-C 07/02/2019

## 2019-07-02 NOTE — Progress Notes (Signed)
I have discussed the procedure for the virtual visit with the patient who has given consent to proceed with assessment and treatment.   Maham Quintin S Brennen Gardiner, CMA     

## 2019-07-02 NOTE — Patient Instructions (Signed)
Instructions sent to MyChart.  Your symptoms are consistent with a bladder infection, also called acute cystitis. Please take your antibiotic (Macrobid) as directed until all pills are gone.  Stay very well hydrated.  Consider a daily probiotic (Align, Culturelle, or Activia) to help prevent stomach upset caused by the antibiotic.  Taking a probiotic daily may also help prevent recurrent UTIs.  Also consider taking AZO (Phenazopyridine) tablets to help decrease pain with urination.  I will call you with your urine testing results.  We will change antibiotics if indicated.  Call or return to clinic if symptoms are not resolved by completion of antibiotic.   Urinary Tract Infection A urinary tract infection (UTI) can occur any place along the urinary tract. The tract includes the kidneys, ureters, bladder, and urethra. A type of germ called bacteria often causes a UTI. UTIs are often helped with antibiotic medicine.  HOME CARE   If given, take antibiotics as told by your doctor. Finish them even if you start to feel better.  Drink enough fluids to keep your pee (urine) clear or pale yellow.  Avoid tea, drinks with caffeine, and bubbly (carbonated) drinks.  Pee often. Avoid holding your pee in for a long time.  Pee before and after having sex (intercourse).  Wipe from front to back after you poop (bowel movement) if you are a woman. Use each tissue only once. GET HELP RIGHT AWAY IF:   You have back pain.  You have lower belly (abdominal) pain.  You have chills.  You feel sick to your stomach (nauseous).  You throw up (vomit).  Your burning or discomfort with peeing does not go away.  You have a fever.  Your symptoms are not better in 3 days. MAKE SURE YOU:   Understand these instructions.  Will watch your condition.  Will get help right away if you are not doing well or get worse. Document Released: 10/19/2007 Document Revised: 01/25/2012 Document Reviewed:  12/01/2011 [facility] Patient Information 2015 [place], [place]. This information is not intended to replace advice given to you by your health care provider. Make sure you discuss any questions you have with your health care provider.

## 2019-07-05 ENCOUNTER — Encounter: Payer: Self-pay | Admitting: Family Medicine

## 2019-07-05 DIAGNOSIS — G4733 Obstructive sleep apnea (adult) (pediatric): Secondary | ICD-10-CM | POA: Diagnosis not present

## 2019-07-09 ENCOUNTER — Other Ambulatory Visit: Payer: Self-pay | Admitting: Family Medicine

## 2019-07-09 NOTE — Telephone Encounter (Signed)
Last OV 07/02/19 Hydrocodone last filled 06/17/19 #90 with 0

## 2019-07-10 ENCOUNTER — Other Ambulatory Visit: Payer: Self-pay | Admitting: Family Medicine

## 2019-07-10 ENCOUNTER — Ambulatory Visit (INDEPENDENT_AMBULATORY_CARE_PROVIDER_SITE_OTHER): Payer: Medicare HMO | Admitting: Primary Care

## 2019-07-10 ENCOUNTER — Encounter: Payer: Self-pay | Admitting: Primary Care

## 2019-07-10 ENCOUNTER — Other Ambulatory Visit: Payer: Self-pay

## 2019-07-10 DIAGNOSIS — G4733 Obstructive sleep apnea (adult) (pediatric): Secondary | ICD-10-CM

## 2019-07-10 DIAGNOSIS — Z9989 Dependence on other enabling machines and devices: Secondary | ICD-10-CM

## 2019-07-10 NOTE — Patient Instructions (Signed)
Recommendations: Continue to wear CPAP every night for 4-6 hours or more Do not drive if experiencing excessive daytime fatigue or somnolence  Follow-up: -6 months with Dr. Elsworth Soho in office    CPAP and BPAP Information CPAP and BPAP are methods of helping a person breathe with the use of air pressure. CPAP stands for "continuous positive airway pressure." BPAP stands for "bi-level positive airway pressure." In both methods, air is blown through your nose or mouth and into your air passages to help you breathe well. CPAP and BPAP use different amounts of pressure to blow air. With CPAP, the amount of pressure stays the same while you breathe in and out. With BPAP, the amount of pressure is increased when you breathe in (inhale) so that you can take larger breaths. Your health care provider will recommend whether CPAP or BPAP would be more helpful for you. Why are CPAP and BPAP treatments used? CPAP or BPAP can be helpful if you have:  Sleep apnea.  Chronic obstructive pulmonary disease (COPD).  Heart failure.  Medical conditions that weaken the muscles of the chest including muscular dystrophy, or neurological diseases such as amyotrophic lateral sclerosis (ALS).  Other problems that cause breathing to be weak, abnormal, or difficult. CPAP is most commonly used for obstructive sleep apnea ([name]) to keep the airways from collapsing when the muscles relax during sleep. How is CPAP or BPAP administered? Both CPAP and BPAP are provided by a small machine with a flexible plastic tube that attaches to a plastic mask. You wear the mask. Air is blown through the mask into your nose or mouth. The amount of pressure that is used to blow the air can be adjusted on the machine. Your health care provider will determine the pressure setting that should be used based on your individual needs. When should CPAP or BPAP be used? In most cases, the mask only needs to be worn during sleep. Generally, the mask  needs to be worn throughout the night and during any daytime naps. People with certain medical conditions may also need to wear the mask at other times when they are awake. Follow instructions from your health care provider about when to use the machine. What are some tips for using the mask?   Because the mask needs to be snug, some people feel trapped or closed-in (claustrophobic) when first using the mask. If you feel this way, you may need to get used to the mask. One way to do this is by holding the mask loosely over your nose or mouth and then gradually applying the mask more snugly. You can also gradually increase the amount of time that you use the mask.  Masks are available in various types and sizes. Some fit over your mouth and nose while others fit over just your nose. If your mask does not fit well, talk with your health care provider about getting a different one.  If you are using a mask that fits over your nose and you tend to breathe through your mouth, a chin strap may be applied to help keep your mouth closed.  The CPAP and BPAP machines have alarms that may sound if the mask comes off or develops a leak.  If you have trouble with the mask, it is very important that you talk with your health care provider about finding a way to make the mask easier to tolerate. Do not stop using the mask. Stopping the use of the mask could have a negative  impact on your health. What are some tips for using the machine?  Place your CPAP or BPAP machine on a secure table or stand near an electrical outlet.  Know where the on/off switch is located on the machine.  Follow instructions from your health care provider about how to set the pressure on your machine and when you should use it.  Do not eat or drink while the CPAP or BPAP machine is on. Food or fluids could get pushed into your lungs by the pressure of the CPAP or BPAP.  Do not smoke. Tobacco smoke residue can damage the machine.  For  home use, CPAP and BPAP machines can be rented or purchased through home health care companies. Many different brands of machines are available. Renting a machine before purchasing may help you find out which particular machine works well for you.  Keep the CPAP or BPAP machine and attachments clean. Ask your health care provider for specific instructions. Get help right away if:  You have redness or open areas around your nose or mouth where the mask fits.  You have trouble using the CPAP or BPAP machine.  You cannot tolerate wearing the CPAP or BPAP mask.  You have pain, discomfort, and bloating in your abdomen. Summary  CPAP and BPAP are methods of helping a person breathe with the use of air pressure.  Both CPAP and BPAP are provided by a small machine with a flexible plastic tube that attaches to a plastic mask.  If you have trouble with the mask, it is very important that you talk with your health care provider about finding a way to make the mask easier to tolerate. This information is not intended to replace advice given to you by your health care provider. Make sure you discuss any questions you have with your health care provider. Document Revised: 08/22/2018 Document Reviewed: 03/21/2016 Elsevier Patient Education  [place].

## 2019-07-10 NOTE — Telephone Encounter (Signed)
Last OV 07/02/19 Hydrocodone last filled 06/17/19 #90 with 0

## 2019-07-10 NOTE — Progress Notes (Addendum)
Virtual Visit via Telephone Note  I connected with [name] on 07/10/19 at 11:30 AM EST by telephone and verified that I am speaking with the correct person using two identifiers.  Location: Patient: Home Provider: Office   I discussed the limitations, risks, security and privacy concerns of performing an evaluation and management service by telephone and the availability of in person appointments. I also discussed with the patient that there may be a patient responsible charge related to this service. The patient expressed understanding and agreed to proceed.   History of Present Illness: 49 year old female, never smoked. PMH significant for [name], GERD, fibromyalgia, obesity. Patient of Dr. Elsworth Soho, last seen in 2019. HST on 10/25/17 showed mild sleep apnea, AHI 10.3/hr. Recommended CPAP auto titrate 5-15cm H20. She was unable to get CPAP d/t financial issues. Repeat HST on 01/16/19 showed mild [name], AHI 8/hr.   Previous [facility] pulmonary encounter: 01/01/2019 Patient contacted today for televisit for [name] follow-up. She had been on CPAP therapy for a history of sleep apnea for approximately 13-14 years until her machine stopped working. She saw Dr. Elsworth Soho and HST in June 2019 showed mild [name]. New order for CPAP sent to [facility]. Patient states that she was unable to get new CPAP machine d/t financial reasons. She does not sleep well. Reports snoring and waking up at night. No energy. [facility] today 12.   07/10/2019 Patient had home sleep study that showed Mild [name], AHI 8/hr. Recommending auto CPAP 5-15cm h20 and follow-up was scheduled for 03/25/19 which was cancelled/rescheduled 4 times. Patient contacted today for teleivist. Reports that she has been doing well. Reports compliance with CPAP. States that she has been sleeping alright, wakes up once during the night. Denies significant daytime fatigue or somnolence. She is having no issue with mask fit or pressure setting. She wears nasal mask. DME company is  [employer]. She should have received a new machine back in summer/fall 2020. Currently weight is 348lbs. We will call DME company to verify Maryfrances Bunnell is correct.   [facility] 04/10/19-07/08/19: Usage 2/90 days Average hours used 3 hours 20 mins 5-15 cm h20 (11.2- 95%) AHI 2.7  Observations/Objective:  - NO shortness of breath, wheezing or cough noted during phone conversation  Assessment and Plan:  Mild [name] - Patient reports compliance with CPAP, airview download shows inconsistent use - Pressure 5-15cm h20 - Continue to encourage patient wear CPAP mask every night for 4-6 hours - Do not drive if experiencing excessive daytime fatigue or somnolence  Follow Up Instructions:   - 6 month fu with Dr. Elsworth Soho in office   I discussed the assessment and treatment plan with the patient. The patient was provided an opportunity to ask questions and all were answered. The patient agreed with the plan and demonstrated an understanding of the instructions.   The patient was advised to call back or seek an in-person evaluation if the symptoms worsen or if the condition fails to improve as anticipated.  I provided 18 minutes of non-face-to-face time during this encounter.   Martyn Ehrich, NP

## 2019-07-11 ENCOUNTER — Telehealth: Payer: Self-pay

## 2019-07-11 ENCOUNTER — Encounter: Payer: Self-pay | Admitting: Family Medicine

## 2019-07-11 ENCOUNTER — Other Ambulatory Visit: Payer: Self-pay | Admitting: Family Medicine

## 2019-07-11 ENCOUNTER — Telehealth: Payer: Self-pay | Admitting: Family Medicine

## 2019-07-11 NOTE — Telephone Encounter (Signed)
See other telephone call

## 2019-07-11 NOTE — Telephone Encounter (Signed)
Please advise 

## 2019-07-11 NOTE — Telephone Encounter (Signed)
Pt called in stating that she normally gets Hydrocodone 10-325 qty 90. She states that when she picked up her medication today it was hydrocodone 7.5-325 qty 60 tabs. Pt would like to know if this could be corrected and sent to the pharmacy. Pt uses [facility]. She can be reached at the home #

## 2019-07-11 NOTE — Telephone Encounter (Signed)
Pt sends multiple refill requests monthly and this can lead to the wrong medication being filled.  Since she picked up 60 Hydrocodone yesterday, I am unable to fill additional pain medication at this time

## 2019-07-11 NOTE — Telephone Encounter (Signed)
Please make pt aware that this is what happens when she submits multiple refill requests for very similar medications.  I had 2 refill requests for hydrocodone and I filled the 1st one that I got to and denied the other one.  Unfortunately, if she picked up the prescription she will need to use the current medication and we can then change the prescription for next month.  If she did not pick up the medication, we can correct the situation

## 2019-07-11 NOTE — Telephone Encounter (Signed)
Pharmacy from [facility] called in stating that a script for [facility] 7.5/325 was sent to pharmacy instead of her normal 10-325. Pharmacist states that patient is requesting the correct prescription sent to pharmacy.  Pharmacist confirmed that patient did pick up medication. Informed pharmacist that PCP is unable to send in any other prescriptions until next month.

## 2019-07-15 ENCOUNTER — Other Ambulatory Visit: Payer: Self-pay | Admitting: Family Medicine

## 2019-07-15 NOTE — Telephone Encounter (Signed)
Last OV 07/02/19 Valium last filled 06/25/19 #60 with 0

## 2019-07-16 ENCOUNTER — Institutional Professional Consult (permissible substitution): Admit: 2019-07-16 | Discharge: 2019-07-17

## 2019-07-16 DIAGNOSIS — M300 Polyarteritis nodosa: Principal | ICD-10-CM

## 2019-07-16 DIAGNOSIS — L409 Psoriasis, unspecified: Principal | ICD-10-CM

## 2019-07-16 NOTE — Unmapped (Signed)
I spent 24 minutes on the phone visit with the patient on the date of service. I spent an additional 10 minutes on pre- and post-visit activities.     The patient was not located and I was not located within 250 yards of a hospital based location during the phone visit. The patient was physically located in [place] or a state in which I am permitted to provide care. The patient and/or parent/guardian understood that s/he may incur co-pays and cost sharing, and agreed to the telemedicine visit. The visit was reasonable and appropriate under the circumstances given the patient's presentation at the time.    The patient and/or parent/guardian has been advised of the potential risks and limitations of this mode of treatment (including, but not limited to, the absence of in-person examination) and has agreed to be treated using telemedicine. The patient's/patient's family's questions regarding telemedicine have been answered.    If the visit was completed in an ambulatory setting, the patient and/or parent/guardian has also been advised to contact their provider???s office for worsening conditions, and seek emergency medical treatment and/or call 911 if the patient deems either necessary.          Patient Name: Autumn Mcdonald  PCP: ??Frederico Hamman, MD      Chief Compliant: Cutaneous PAN    Identification: Pt self identified using name and date of birth  Patient location: [place]  The limitations of this telemedicine encounter were discussed with patient. Both the patient and myself agreed to this encounter despite these limitations. Benefits of this telemedicine encounter included allowing for continued care of patient and minimizing risk of exposure to COVID-19.      HPI: Autumn Mcdonald is a 49 y.o. female with hx of cutaneous PAN of the legs. 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 Additional hx of fibromyalgia for which she is taking duloxetine and gabapentin.   Treatment hx: - Started prednisone taper and then mtx in 11/2014.  - Plaquenil started in 12/2014.   - Started imuran in 07/2015, and titrated up to 150 mg qd w/o sufficient control of her PAN.   - Switched to cellcept 09/2015, titrated up to 1500 mg BID.   - Persistent leg swelling and pain, thought to be due to swelling from prednisone, so tapered off prednisone  - Re-biopsied 08/2016, results consistent with venous stasis disease, so it was unclear if her symptoms were due to PAN vs venous stasis.  - Due to persistent sx, cellcept discontinued and dapsone started by dermatology. Also received intralesional steroid injections with derm.   -Was evaluated by vascular surgery and no vascular insufficiency was noted, thus her leg swelling has been attributed to lymphedema.   - Plaque psoriasis diagnosed by dermatology 2019. Humira started 08/2017.   - HCQ discontinued 09/2017 due to new dx of psoriasis and unclear benefit for PAN.   - Developed TNF induced psoriasis, so humira discontinued and skyrizi started 03/2019  - Current medication regimen:  skyrizi for psoriasis.     Interim history:  Presents today for f/u.     She feels the skyrizi is helping, has only had 2 loading doses.  She already notes improvement in skin psoriasis.  Still has some psoriasis on the legs, noted psoriasis from the ankles going up about 6 inches on her left leg.  Some peeling skin on the left leg.  She has redness and tenderness over the left lower leg.  Feels like a shocking  or burning pain, only in the left leg.  However she feels the skin on the left leg has been better since starting skyrizi.  Left leg pain better with gabapentin and hydrocodone.    On the right leg she has a small lesion that is leaking clear fluid x3 days.  She called and discussed with her PCP, was told to keep the area wrapped which she has been doing with significant improvement.  She denies any swelling in the legs.  She was told to follow-up with her PCP if it does not resolve. She notes flares of pain 3-5 times every day, mostly in the left leg.  She thinks the pain may be a little better since changing to skyrizi, but not significant improvement.  Overall she feels much better today than she did at her last visit in November.    She has had some social stressors, her mother passed away in 2023/01/05 and then a family friend passed away just before Christmas.  Her dad has been sick and she is taking care of him, but has no one to help.    She complains of feeling cold all the time, though denies rigors.  No fevers.  Encouraged her to discuss this with her PCP.      ROS??: Attests to the above, otherwise, review of ten system is negative.  ????Record Review: Available records were reviewed, including pertinent office visits, labs, and imaging.      Past Medical and Surgical History:  ??  Patient Active Problem List    Diagnosis Date Noted   ??? Melena 02/16/2017   ??? Chronic narcotic use 09/07/2016   ??? Bleeding stomach ulcer 01/20/2016   ??? Acute blood loss anemia 01/14/2016   ??? BRBPR (bright red blood per rectum) 01/14/2016   ??? Neuropathy 12/09/2015   ??? Migraine headache 11/28/2014   ??? PAN (polyarteritis nodosa) (CMS-HCC) 11/28/2014   ??? Chronic gout of multiple sites 02/25/2014   ??? Gastroesophageal reflux disease 01/08/2014   ??? Leg pain, lateral 12/11/2012   ??? History of surgical procedure 10/31/2012   ??? Psoriasis 01/09/2012   ??? Degeneration of intervertebral disc 11/30/2011   ??? Cutaneous fungal infection 11/15/2011   ??? DJD (degenerative joint disease) 10/14/2011   ??? Frequent urination 04/13/2011   ??? [name] (obstructive sleep apnea) 11/16/2010   ??? Elevated blood sugar 08/23/2010   ??? Other abnormal glucose 10/08/2009   ??? Contracture of tendon 08/17/2009   ??? Leg pain, right 11/05/2008   ??? Other malaise and fatigue 09/19/2008   ??? Ingrowing nail 06/17/2008   ??? Adverse drug reaction 04/03/2008   ??? Morbid obesity (CMS-HCC) 01-05-2007   ??? Hyperlipidemia 09/22/2006   ??? Pain in joint, lower leg 09/22/2006 ??? Muscle pain 09/20/2006   ??? Fibromyalgia 09/20/2006   ??? Anxiety 06/28/2006   ??? Hypertension 06/28/2006     Past Medical History:   Diagnosis Date   ??? Allergic     See other notes   ??? Arthritis 05/16/2001   ??? Bleeding gastric ulcer    ??? Cervical cancer (CMS-HCC)    ??? Depression    ??? Disorder of skin or subcutaneous tissue 2008    Diagnosised in   ??? Eczema 05/16/2014   ??? Fibromyalgia    ??? Hypertension    ??? Morbid obesity (CMS-HCC)    ??? Psoriasis Last 2 years     Past Surgical History:   Procedure Laterality Date   ??? CESAREAN SECTION  1994   ??? CHOLECYSTECTOMY  March 1994   ??? GASTRIC BYPASS  January 2014   ??? HYSTERECTOMY  June 1998   ??? SKIN BIOPSY  2017     Allergies:   ??  Allergies   Allergen Reactions   ??? Iohexol Hives     Pt treated with PO benedryl   ??? Keflex [Cephalexin] Shortness Of Breath and Hives   ??? Lincomycin Hcl Hives   ??? Lisinopril Hives     Hives  Hives   ??? Sulfa (Sulfonamide Antibiotics) Swelling and Rash   ??? Sulfamethoxazole-Trimethoprim Hives     REACTION: Rash, throat closed, eyes swelled.  Hives  Hives   ??? Aspirin      REACTION: Hives  High Doses only. Causes rash.  High Doses only. Causes rash.   ??? Clindamycin      Hives  Hives   ??? Doxycycline      REACTION: Hives  Hives  Hives   ??? Naproxen      REACTION: Hives  Eyes well  Eyes well   ??? Niacin      REACTION: Swelling, problems breathing   ??? Benzoin Compound Rash   ??? Pneumococcal 23-Val Ps Vaccine Rash     cellulitis     Current Outpatient Medications:  ??  Current Outpatient Medications   Medication Sig Dispense Refill   ??? ALPRAZolam (XANAX) 0.5 MG tablet TAKE 1 TABLET BY MOUTH 3 TIMES DAILY AS NEEDED FOR ANXIETY OR SLEEP     ??? ARIPiprazole (ABILIFY) 10 MG tablet Take 10 mg by mouth daily.     ??? armodafinil (NUVIGIL) 250 mg tablet Take 250 mg by mouth three (3) times a day.      ??? biotin 10 mg Tab 10 mg per 1 tablet, ORAL, Daily, 0 Refill(s)     ??? buPROPion (WELLBUTRIN XL) 150 MG 24 hr tablet Take 150 mg by mouth.     ??? busPIRone (BUSPAR) 30 MG tablet Take 30 mg by mouth.     ??? calcipotriene (DOVONOX) 0.005 % ointment Apply topically Two (2) times a day. Mix with halobetasol 60 g 0   ??? calcium citrate (CALCITRATE) 200 mg elem calcium (950 mg) tablet 950 mg per 1 tablet, ORAL, Daily, 60 tablet, 0 Refill(s)     ??? clonazePAM (KLONOPIN) 1 MG tablet TAKE 1 TABLET BY MOUTH EVERY DAY     ??? clotrimazole-betamethasone (LOTRISONE) 1-0.05 % cream APPLY one application 2 TIMES DAILY     ??? cyclobenzaprine (FLEXERIL) 10 MG tablet Take 10 mg by mouth once as needed.      ??? diazePAM (VALIUM) 5 MG tablet TAKE 1/2 TO 1 TABLET BY MOUTH 2 TIMES DAILY AS NEEDED FOR anxiety     ??? DULoxetine (CYMBALTA) 60 MG capsule Take 60 mg by mouth daily. 120 mg daily     ??? eletriptan (RELPAX) 40 MG tablet Take 40 mg by mouth.     ??? ferrous sulfate 325 (65 FE) MG tablet Take 1 tablet (325 mg total) by mouth daily with breakfast.  6   ??? fluconazole (DIFLUCAN) 100 MG tablet Take 100 mg by mouth.     ??? gabapentin (NEURONTIN) 600 MG tablet Take 2 tablets (1,200 mg total) by mouth Three (3) times a day. 90 tablet 3   ??? halobetasol (ULTRAVATE) 0.05 % cream Apply topically.     ??? HYDROcodone-acetaminophen (NORCO 10-325) 10-325 mg per tablet Take 1 tablet by mouth once as needed.      ??? nebivolol (BYSTOLIC) 10 MG tablet Take 10  mg by mouth daily.      ??? NYSTOP 100,000 unit/gram powder APPLY 4 grams TO THE AFFECTED AREA 4 TIMES DAILY     ??? OXcarbazepine (TRILEPTAL) 150 MG tablet TAKE 1 TABLET BY MOUTH 2 TIMES DAILY  0   ??? OXcarbazepine (TRILEPTAL) 300 MG tablet Take 300 mg by mouth.     ??? pantoprazole (PROTONIX) 40 MG tablet Take 40 mg by mouth Two (2) times a day. bid     ??? pentoxifylline (TRENTAL) 400 mg CR tablet TAKE 1 TABLET BY MOUTH 3 TIMES DAILY WITH MEALS 90 tablet 2   ??? promethazine (PHENERGAN) 25 MG tablet once as needed.      ??? risankizumab-rzaa (SKYRIZI) 150mg /1.23mL(75 mg/0.83 mL x2) SyKt Inject the contents of two syringes (150 mg total) under the skin at weeks 0, 4, and then every 12 weeks thereafter. 1 each 6   ??? rosuvastatin (CRESTOR) 20 MG tablet TAKE 1 TABLET BY MOUTH EVERY DAY       No current facility-administered medications for this visit.         Immunization History   Administered Date(s) Administered   ??? INFLUENZA TIV (TRI) 75MO+ W/ PRESERV (IM) 03/17/2011   ??? Influenza Vaccine Quad (IIV4 PF) 1mo+ injectable 02/17/2015, 05/17/2015, 02/23/2016, 02/13/2017, 01/18/2019   ??? Influenza Virus Vaccine, unspecified formulation 02/27/2013, 06/17/2015, 03/14/2016, 01/11/2018   ??? Influenza Whole 02/15/2010   ??? PNEUMOCOCCAL POLYSACCHARIDE 23 03/17/2011, 01/10/2018   ??? Pneumococcal Conjugate 13-Valent 10/04/2017   ??? TdaP 01/05/2016, 07/24/2018   ??? Tetanus and diptheria,(adult), adsorbed, 2Lf tetanus toxoid, PF 05/16/2002     ????  PHYSICAL EXAM??  There were no vitals filed for this visit.    PHYSICAL EXAM:  Patient reported vitals:  There were no vitals filed for this visit.   General:   Does not sound to be in distress   Lungs:  No wheezing, coughing, or increased respiratory effort noted   Psych:  Appropriate interaction             ??GENERAL SUMMARY/IMPRESSION ??AND RECOMMENDATIONS: ??  ?? 1. Cutaneous polyarteritis nodosa (CMS-HCC)  Reporting painful red lesions on her lower left leg which she believes to be psoriatic plaques, though potentially these could be areas of active PAN.  She has failed multiple therapies for PAN as noted above in HPI, and overall is improved today compared to prior, feels the [name] is working for what ever lesion she has on her legs.    2.  Psoriasis  Continue follow-up with dermatology and management with skyrizi per dermatology recommendations    HCM:   - PCV13 Status: 10/04/2017  - PPSV 23 Status: 03/17/2011, 01/10/2018  - Annual Influenza vaccine. Status: 12/2018 per patient  - Bone health: not on prednisone   - Plaquenil eye exam: no longer on HCQ   - Contraception:s/p hysterectomy     RTC in 4 mo as scheduled with Dr Scarlette Calico

## 2019-07-19 NOTE — Unmapped (Signed)
[facility] Refill Coordination Note    Patient reports things are going well on Skyrizi. She has noticed improvement    Specialty Medication(s) to be Shipped:   Inflammatory Disorders: Skyrizi    Other medication(s) to be shipped: n/a     Autumn Mcdonald, DOB: Aug 28, 1970  Phone: [phone] (home)       All above HIPAA information was verified with patient.     Was a Nurse, learning disability used for this call? No    Completed refill call assessment today to schedule patient's medication shipment from the [facility] [phone]).       Specialty medication(s) and dose(s) confirmed: Regimen is correct and unchanged.   Changes to medications: Alizabeth reports no changes at this time.  Changes to insurance: No  Questions for the pharmacist: No    Confirmed patient received Welcome Packet with first shipment. The patient will receive a drug information handout for each medication shipped and additional FDA Medication Guides as required.       DISEASE/MEDICATION-SPECIFIC INFORMATION        For patients on injectable medications: Patient currently has 0 doses left.  Next injection is scheduled for 08/02/2019.    SPECIALTY MEDICATION ADHERENCE     Medication Adherence    Patient reported X missed doses in the last month: 0  Specialty Medication: Skyrizi 150 mg/1.66 ml  Patient is on additional specialty medications: No  Any gaps in refill history greater than 2 weeks in the last 3 months: no  Demonstrates understanding of importance of adherence: yes  Informant: patient  Reliability of informant: reliable  Confirmed plan for next specialty medication refill: delivery by pharmacy  Refills needed for supportive medications: not needed                Skyrizi 150 mg/1.66 ml . 0 on hand      SHIPPING     Shipping address confirmed in Epic.     Delivery Scheduled: Yes, Expected medication delivery date: 07/25/2019.     Medication will be delivered via UPS to the prescription address in Epic WAM.    Autumn Mcdonald   [facility] Specialty Technician

## 2019-07-21 ENCOUNTER — Other Ambulatory Visit: Payer: Self-pay | Admitting: Family Medicine

## 2019-07-21 ENCOUNTER — Encounter: Payer: Self-pay | Admitting: Family Medicine

## 2019-07-22 ENCOUNTER — Other Ambulatory Visit: Payer: Self-pay | Admitting: Family Medicine

## 2019-07-22 MED ORDER — HYDROCODONE-ACETAMINOPHEN 10-325 MG PO TABS: 1.0000 | ORAL_TABLET | Freq: Four times a day (QID) | ORAL | 0 refills | Status: DC | PRN

## 2019-07-22 NOTE — Telephone Encounter (Signed)
Last OV 07/02/19 Hydrocodone last filled 06/17/19 #90 with 0

## 2019-07-23 ENCOUNTER — Encounter: Payer: Self-pay | Admitting: Family Medicine

## 2019-07-23 NOTE — Unmapped (Signed)
This encounter was created in error - please disregard.

## 2019-07-24 MED FILL — SKYRIZI 150 MG/1.66 ML(75 MG/0.83 ML X 2) SUBCUTANEOUS SYRINGE KIT: 30 days supply | Qty: 1 | Fill #2 | Status: AC

## 2019-07-24 MED FILL — SKYRIZI 150 MG/1.66 ML(75 MG/0.83 ML X 2) SUBCUTANEOUS SYRINGE KIT: 30 days supply | Qty: 1 | Fill #2

## 2019-07-24 NOTE — Unmapped (Unsigned)
[facility] Pharmacist Intervention    Type of intervention: counseling on COVID-19 vaccination while on Skyrizi    Medication: [name]    Returning call regarding timing of COVID-19 vaccination and next Skyrizi dose. Informed patient per 2021 ACR guidance there were no concerns with regard to adjustment of timing of next dose of Skyrizi and COVID-19 vaccination - next Skyrizi dose is scheduled for 08/02/2019. Mrs. Tang mentioned some slight worsening of symptoms recently leading up to this dose, running low on topical corticosteroid supply: reminded her of upcoming [facility] appointment on 08/09/2019 and advised to reach out if symptoms worsen before appointment.     Follow up needed: none    Approximate time spent: 10 minutes    Meyer Cory, PharmD Candidate  [employer] Class of 2021

## 2019-07-26 ENCOUNTER — Inpatient Hospital Stay (HOSPITAL_COMMUNITY): Payer: Medicare HMO

## 2019-07-26 ENCOUNTER — Encounter (HOSPITAL_COMMUNITY): Payer: Self-pay

## 2019-07-26 ENCOUNTER — Emergency Department (HOSPITAL_COMMUNITY): Payer: Medicare HMO

## 2019-07-26 ENCOUNTER — Inpatient Hospital Stay (HOSPITAL_COMMUNITY)
Admission: EM | Admit: 2019-07-26 | Discharge: 2019-08-02 | DRG: 871 | Disposition: A | Discharge status: Home Health | Payer: Medicare HMO | Attending: Internal Medicine | Admitting: Internal Medicine

## 2019-07-26 ENCOUNTER — Other Ambulatory Visit: Payer: Self-pay

## 2019-07-26 DIAGNOSIS — L89892 Pressure ulcer of other site, stage 2: Secondary | ICD-10-CM | POA: Diagnosis present

## 2019-07-26 DIAGNOSIS — R000 Tachycardia, unspecified: Secondary | ICD-10-CM | POA: Diagnosis not present

## 2019-07-26 DIAGNOSIS — Z8541 Personal history of malignant neoplasm of cervix uteri: Secondary | ICD-10-CM

## 2019-07-26 DIAGNOSIS — I5032 Chronic diastolic (congestive) heart failure: Secondary | ICD-10-CM | POA: Diagnosis not present

## 2019-07-26 DIAGNOSIS — D849 Immunodeficiency, unspecified: Secondary | ICD-10-CM | POA: Diagnosis not present

## 2019-07-26 DIAGNOSIS — I110 Hypertensive heart disease with heart failure: Secondary | ICD-10-CM | POA: Diagnosis not present

## 2019-07-26 DIAGNOSIS — Z888 Allergy status to other drugs, medicaments and biological substances status: Secondary | ICD-10-CM

## 2019-07-26 DIAGNOSIS — Z8249 Family history of ischemic heart disease and other diseases of the circulatory system: Secondary | ICD-10-CM

## 2019-07-26 DIAGNOSIS — Z881 Allergy status to other antibiotic agents status: Secondary | ICD-10-CM

## 2019-07-26 DIAGNOSIS — J309 Allergic rhinitis, unspecified: Secondary | ICD-10-CM | POA: Diagnosis present

## 2019-07-26 DIAGNOSIS — D649 Anemia, unspecified: Secondary | ICD-10-CM | POA: Diagnosis present

## 2019-07-26 DIAGNOSIS — R0689 Other abnormalities of breathing: Secondary | ICD-10-CM | POA: Diagnosis not present

## 2019-07-26 DIAGNOSIS — Z713 Dietary counseling and surveillance: Secondary | ICD-10-CM

## 2019-07-26 DIAGNOSIS — L03119 Cellulitis of unspecified part of limb: Secondary | ICD-10-CM | POA: Diagnosis not present

## 2019-07-26 DIAGNOSIS — G4733 Obstructive sleep apnea (adult) (pediatric): Secondary | ICD-10-CM | POA: Diagnosis present

## 2019-07-26 DIAGNOSIS — M797 Fibromyalgia: Secondary | ICD-10-CM | POA: Diagnosis present

## 2019-07-26 DIAGNOSIS — E669 Obesity, unspecified: Secondary | ICD-10-CM | POA: Diagnosis present

## 2019-07-26 DIAGNOSIS — I5033 Acute on chronic diastolic (congestive) heart failure: Secondary | ICD-10-CM | POA: Diagnosis present

## 2019-07-26 DIAGNOSIS — E871 Hypo-osmolality and hyponatremia: Secondary | ICD-10-CM | POA: Diagnosis present

## 2019-07-26 DIAGNOSIS — K219 Gastro-esophageal reflux disease without esophagitis: Secondary | ICD-10-CM | POA: Diagnosis present

## 2019-07-26 DIAGNOSIS — Z887 Allergy status to serum and vaccine status: Secondary | ICD-10-CM

## 2019-07-26 DIAGNOSIS — Z9884 Bariatric surgery status: Secondary | ICD-10-CM

## 2019-07-26 DIAGNOSIS — Z882 Allergy status to sulfonamides status: Secondary | ICD-10-CM

## 2019-07-26 DIAGNOSIS — L409 Psoriasis, unspecified: Secondary | ICD-10-CM | POA: Diagnosis present

## 2019-07-26 DIAGNOSIS — M300 Polyarteritis nodosa: Secondary | ICD-10-CM | POA: Diagnosis present

## 2019-07-26 DIAGNOSIS — E785 Hyperlipidemia, unspecified: Secondary | ICD-10-CM | POA: Diagnosis present

## 2019-07-26 DIAGNOSIS — F419 Anxiety disorder, unspecified: Secondary | ICD-10-CM | POA: Diagnosis present

## 2019-07-26 DIAGNOSIS — Z79899 Other long term (current) drug therapy: Secondary | ICD-10-CM

## 2019-07-26 DIAGNOSIS — R609 Edema, unspecified: Secondary | ICD-10-CM | POA: Diagnosis not present

## 2019-07-26 DIAGNOSIS — L0390 Cellulitis, unspecified: Secondary | ICD-10-CM

## 2019-07-26 DIAGNOSIS — L03116 Cellulitis of left lower limb: Secondary | ICD-10-CM | POA: Diagnosis present

## 2019-07-26 DIAGNOSIS — Z22322 Carrier or suspected carrier of Methicillin resistant Staphylococcus aureus: Secondary | ICD-10-CM

## 2019-07-26 DIAGNOSIS — R600 Localized edema: Secondary | ICD-10-CM | POA: Diagnosis not present

## 2019-07-26 DIAGNOSIS — Z6841 Body Mass Index (BMI) 40.0 and over, adult: Secondary | ICD-10-CM | POA: Diagnosis not present

## 2019-07-26 DIAGNOSIS — L03115 Cellulitis of right lower limb: Secondary | ICD-10-CM | POA: Diagnosis present

## 2019-07-26 DIAGNOSIS — A419 Sepsis, unspecified organism: Principal | ICD-10-CM

## 2019-07-26 DIAGNOSIS — R4182 Altered mental status, unspecified: Secondary | ICD-10-CM | POA: Diagnosis not present

## 2019-07-26 DIAGNOSIS — I10 Essential (primary) hypertension: Secondary | ICD-10-CM | POA: Diagnosis not present

## 2019-07-26 DIAGNOSIS — F329 Major depressive disorder, single episode, unspecified: Secondary | ICD-10-CM | POA: Diagnosis not present

## 2019-07-26 DIAGNOSIS — R509 Fever, unspecified: Secondary | ICD-10-CM | POA: Diagnosis not present

## 2019-07-26 DIAGNOSIS — Z20822 Contact with and (suspected) exposure to covid-19: Secondary | ICD-10-CM | POA: Diagnosis not present

## 2019-07-26 DIAGNOSIS — M1A9XX0 Chronic gout, unspecified, without tophus (tophi): Secondary | ICD-10-CM | POA: Diagnosis present

## 2019-07-26 DIAGNOSIS — Z90710 Acquired absence of both cervix and uterus: Secondary | ICD-10-CM

## 2019-07-26 DIAGNOSIS — Z9049 Acquired absence of other specified parts of digestive tract: Secondary | ICD-10-CM

## 2019-07-26 DIAGNOSIS — Z886 Allergy status to analgesic agent status: Secondary | ICD-10-CM

## 2019-07-26 DIAGNOSIS — Z883 Allergy status to other anti-infective agents status: Secondary | ICD-10-CM

## 2019-07-26 LAB — POCT I-STAT EG7
Acid-Base Excess: 4 mmol/L — ABNORMAL HIGH (ref 0.0–2.0)
Bicarbonate: 26.6 mmol/L (ref 20.0–28.0)
Calcium, Ion: 1.03 mmol/L — ABNORMAL LOW (ref 1.15–1.40)
HCT: 34 % — ABNORMAL LOW (ref 36.0–46.0)
Hemoglobin: 11.6 g/dL — ABNORMAL LOW (ref 12.0–15.0)
O2 Saturation: 96 %
Potassium: 4.7 mmol/L (ref 3.5–5.1)
Sodium: 122 mmol/L — ABNORMAL LOW (ref 135–145)
TCO2: 28 mmol/L (ref 22–32)
pCO2, Ven: 31.9 mmHg — ABNORMAL LOW (ref 44.0–60.0)
pH, Ven: 7.529 — ABNORMAL HIGH (ref 7.250–7.430)
pO2, Ven: 71 mmHg — ABNORMAL HIGH (ref 32.0–45.0)

## 2019-07-26 LAB — I-STAT BETA HCG BLOOD, ED (MC, WL, AP ONLY): I-stat hCG, quantitative: <5 m[IU]/mL (ref <5)

## 2019-07-26 LAB — CBC WITH DIFFERENTIAL/PLATELET
Abs Immature Granulocytes: 0.06 10*3/uL (ref 0.00–0.07)
Basophils Absolute: 0.1 10*3/uL (ref 0.0–0.1)
Basophils Relative: 1 %
Eosinophils Absolute: 0 10*3/uL (ref 0.0–0.5)
Eosinophils Relative: 0 %
HCT: 33.3 % — ABNORMAL LOW (ref 36.0–46.0)
Hemoglobin: 10.5 g/dL — ABNORMAL LOW (ref 12.0–15.0)
Immature Granulocytes: 1 %
Lymphocytes Relative: 6 %
Lymphs Abs: 0.7 10*3/uL (ref 0.7–4.0)
MCH: 27.2 pg (ref 26.0–34.0)
MCHC: 31.5 g/dL (ref 30.0–36.0)
MCV: 86.3 fL (ref 80.0–100.0)
Monocytes Absolute: 1.1 10*3/uL — ABNORMAL HIGH (ref 0.1–1.0)
Monocytes Relative: 10 %
Neutro Abs: 8.9 10*3/uL — ABNORMAL HIGH (ref 1.7–7.7)
Neutrophils Relative %: 82 %
Platelets: 294 10*3/uL (ref 150–400)
RBC: 3.86 MIL/uL — ABNORMAL LOW (ref 3.87–5.11)
RDW: 15.2 % (ref 11.5–15.5)
WBC: 10.8 10*3/uL — ABNORMAL HIGH (ref 4.0–10.5)
nRBC: 0 % (ref 0.0–0.2)

## 2019-07-26 LAB — LIPID PANEL
Cholesterol: 107 mg/dL (ref 0–200)
HDL: 39 mg/dL — ABNORMAL LOW (ref >40)
LDL Cholesterol: 59 mg/dL (ref 0–99)
Total CHOL/HDL Ratio: 2.7 RATIO
Triglycerides: 47 mg/dL (ref <150)
VLDL: 9 mg/dL (ref 0–40)

## 2019-07-26 LAB — PROTIME-INR
INR: 1.2 (ref 0.8–1.2)
Prothrombin Time: 14.8 seconds (ref 11.4–15.2)

## 2019-07-26 LAB — BASIC METABOLIC PANEL
Anion gap: 10 (ref 5–15)
BUN: 9 mg/dL (ref 6–20)
CO2: 24 mmol/L (ref 22–32)
Calcium: 8.1 mg/dL — ABNORMAL LOW (ref 8.9–10.3)
Chloride: 92 mmol/L — ABNORMAL LOW (ref 98–111)
Creatinine, Ser: 0.59 mg/dL (ref 0.44–1.00)
GFR calc Af Amer: >60 mL/min (ref >60)
GFR calc non Af Amer: >60 mL/min (ref >60)
Glucose, Bld: 96 mg/dL (ref 70–99)
Potassium: 4.9 mmol/L (ref 3.5–5.1)
Sodium: 126 mmol/L — ABNORMAL LOW (ref 135–145)

## 2019-07-26 LAB — COMPREHENSIVE METABOLIC PANEL
ALT: 15 U/L (ref 0–44)
AST: 22 U/L (ref 15–41)
Albumin: 2.8 g/dL — ABNORMAL LOW (ref 3.5–5.0)
Alkaline Phosphatase: 113 U/L (ref 38–126)
Anion gap: 9 (ref 5–15)
BUN: 10 mg/dL (ref 6–20)
CO2: 23 mmol/L (ref 22–32)
Calcium: 8.1 mg/dL — ABNORMAL LOW (ref 8.9–10.3)
Chloride: 90 mmol/L — ABNORMAL LOW (ref 98–111)
Creatinine, Ser: 0.61 mg/dL (ref 0.44–1.00)
GFR calc Af Amer: >60 mL/min (ref >60)
GFR calc non Af Amer: >60 mL/min (ref >60)
Glucose, Bld: 111 mg/dL — ABNORMAL HIGH (ref 70–99)
Potassium: 4.9 mmol/L (ref 3.5–5.1)
Sodium: 122 mmol/L — ABNORMAL LOW (ref 135–145)
Total Bilirubin: 0.4 mg/dL (ref 0.3–1.2)
Total Protein: 6.4 g/dL — ABNORMAL LOW (ref 6.5–8.1)

## 2019-07-26 LAB — SARS CORONAVIRUS 2 (TAT 6-24 HRS): SARS Coronavirus 2: NEGATIVE

## 2019-07-26 LAB — URINALYSIS, ROUTINE W REFLEX MICROSCOPIC
Bilirubin Urine: NEGATIVE
Glucose, UA: NEGATIVE mg/dL
Hgb urine dipstick: NEGATIVE
Ketones, ur: NEGATIVE mg/dL
Leukocytes,Ua: NEGATIVE
Nitrite: NEGATIVE
Protein, ur: NEGATIVE mg/dL
Specific Gravity, Urine: 1.018 (ref 1.005–1.030)
pH: 7 (ref 5.0–8.0)

## 2019-07-26 LAB — TSH: TSH: 1.094 u[IU]/mL (ref 0.350–4.500)

## 2019-07-26 LAB — LACTIC ACID, PLASMA
Lactic Acid, Venous: 0.9 mmol/L (ref 0.5–1.9)
Lactic Acid, Venous: 0.6 mmol/L (ref 0.5–1.9)

## 2019-07-26 LAB — HIV ANTIBODY (ROUTINE TESTING W REFLEX): HIV Screen 4th Generation wRfx: NONREACTIVE

## 2019-07-26 LAB — MRSA PCR SCREENING: MRSA by PCR: POSITIVE — AB

## 2019-07-26 LAB — APTT: aPTT: 31 seconds (ref 24–36)

## 2019-07-26 LAB — OSMOLALITY: Osmolality: 270 mOsm/kg — ABNORMAL LOW (ref 275–295)

## 2019-07-26 LAB — POC SARS CORONAVIRUS 2 AG -  ED: SARS Coronavirus 2 Ag: NEGATIVE

## 2019-07-26 MED ORDER — OXCARBAZEPINE 150 MG PO TABS
300.0000 mg | ORAL_TABLET | Freq: Two times a day (BID) | ORAL | Status: DC
  Administered 2019-07-26 – 2019-08-02 (×14): 300 mg via ORAL
  Filled 2019-07-26 (×14): qty 2

## 2019-07-26 MED ORDER — SODIUM CHLORIDE 0.9 % IV SOLN: 2.0000 g | Freq: Once | INTRAVENOUS | Status: DC

## 2019-07-26 MED ORDER — CALCIUM CITRATE 950 (200 CA) MG PO TABS
1.0000 | ORAL_TABLET | Freq: Every day | ORAL | Status: DC
  Filled 2019-07-26: qty 1

## 2019-07-26 MED ORDER — CLONAZEPAM 0.5 MG PO TABS
1.0000 mg | ORAL_TABLET | Freq: Every day | ORAL | Status: DC
  Administered 2019-07-26 – 2019-08-01 (×7): 1 mg via ORAL
  Filled 2019-07-26 (×7): qty 2

## 2019-07-26 MED ORDER — SODIUM CHLORIDE 0.9 % IV SOLN
2.0000 g | Freq: Once | INTRAVENOUS | Status: AC
  Administered 2019-07-26: 2 g via INTRAVENOUS
  Filled 2019-07-26: qty 2

## 2019-07-26 MED ORDER — VANCOMYCIN HCL 2000 MG/400ML IV SOLN
2000.0000 mg | Freq: Once | INTRAVENOUS | Status: AC
  Administered 2019-07-26: 2000 mg via INTRAVENOUS
  Filled 2019-07-26: qty 400

## 2019-07-26 MED ORDER — CLOBETASOL PROPIONATE 0.05 % EX CREA
TOPICAL_CREAM | Freq: Two times a day (BID) | CUTANEOUS | Status: DC
  Filled 2019-07-26: qty 15

## 2019-07-26 MED ORDER — CLOBETASOL PROPIONATE 0.05 % EX CREA
1.0000 "application " | TOPICAL_CREAM | Freq: Two times a day (BID) | CUTANEOUS | Status: DC
  Filled 2019-07-26: qty 15

## 2019-07-26 MED ORDER — NYSTATIN 100000 UNIT/GM EX POWD
1.0000 "application " | Freq: Four times a day (QID) | CUTANEOUS | Status: DC
  Administered 2019-07-26 – 2019-08-02 (×17): 1 via TOPICAL
  Filled 2019-07-26 (×2): qty 15

## 2019-07-26 MED ORDER — PROMETHAZINE HCL 25 MG PO TABS: 25.0000 mg | ORAL_TABLET | Freq: Four times a day (QID) | ORAL | Status: DC | PRN

## 2019-07-26 MED ORDER — CYCLOBENZAPRINE HCL 10 MG PO TABS: 10.0000 mg | ORAL_TABLET | Freq: Three times a day (TID) | ORAL | Status: DC | PRN

## 2019-07-26 MED ORDER — ARMODAFINIL 250 MG PO TABS: 250.0000 mg | ORAL_TABLET | Freq: Every day | ORAL | Status: DC

## 2019-07-26 MED ORDER — HYDROCODONE-ACETAMINOPHEN 10-325 MG PO TABS
1.0000 | ORAL_TABLET | Freq: Four times a day (QID) | ORAL | Status: DC | PRN
  Administered 2019-07-26 – 2019-08-01 (×13): 1 via ORAL
  Filled 2019-07-26 (×15): qty 1

## 2019-07-26 MED ORDER — DIAZEPAM 5 MG PO TABS: 2.5000 mg | ORAL_TABLET | Freq: Two times a day (BID) | ORAL | Status: DC | PRN

## 2019-07-26 MED ORDER — NEBIVOLOL HCL 5 MG PO TABS
10.0000 mg | ORAL_TABLET | Freq: Every day | ORAL | Status: DC
  Administered 2019-07-27 – 2019-08-02 (×7): 10 mg via ORAL
  Filled 2019-07-26 (×7): qty 2

## 2019-07-26 MED ORDER — VANCOMYCIN HCL IN DEXTROSE 1-5 GM/200ML-% IV SOLN: 1000.0000 mg | Freq: Once | INTRAVENOUS | Status: DC

## 2019-07-26 MED ORDER — ACETAMINOPHEN 650 MG RE SUPP: 650.0000 mg | Freq: Four times a day (QID) | RECTAL | Status: DC | PRN

## 2019-07-26 MED ORDER — PANTOPRAZOLE SODIUM 40 MG PO TBEC
40.0000 mg | DELAYED_RELEASE_TABLET | Freq: Two times a day (BID) | ORAL | Status: DC
  Administered 2019-07-26 – 2019-08-02 (×14): 40 mg via ORAL
  Filled 2019-07-26 (×15): qty 1

## 2019-07-26 MED ORDER — ROSUVASTATIN CALCIUM 20 MG PO TABS
20.0000 mg | ORAL_TABLET | Freq: Every evening | ORAL | Status: DC
  Administered 2019-07-26 – 2019-08-01 (×7): 20 mg via ORAL
  Filled 2019-07-26 (×6): qty 1
  Filled 2019-07-26: qty 4

## 2019-07-26 MED ORDER — ACETAMINOPHEN 325 MG PO TABS
650.0000 mg | ORAL_TABLET | Freq: Four times a day (QID) | ORAL | Status: DC | PRN
  Administered 2019-07-27 – 2019-08-02 (×5): 650 mg via ORAL
  Filled 2019-07-26 (×5): qty 2

## 2019-07-26 MED ORDER — FERROUS SULFATE 325 (65 FE) MG PO TABS: 325.0000 mg | ORAL_TABLET | Freq: Every day | ORAL | Status: DC

## 2019-07-26 MED ORDER — BUPROPION HCL ER (XL) 150 MG PO TB24
150.0000 mg | ORAL_TABLET | Freq: Every day | ORAL | Status: DC
  Administered 2019-07-27 – 2019-08-02 (×7): 150 mg via ORAL
  Filled 2019-07-26 (×7): qty 1

## 2019-07-26 MED ORDER — BIOTIN 10 MG PO TABS: 10.0000 mg | ORAL_TABLET | Freq: Every day | ORAL | Status: DC

## 2019-07-26 MED ORDER — DULOXETINE HCL 60 MG PO CPEP
60.0000 mg | ORAL_CAPSULE | Freq: Every day | ORAL | Status: DC
  Administered 2019-07-27 – 2019-08-02 (×7): 60 mg via ORAL
  Filled 2019-07-26 (×7): qty 1

## 2019-07-26 MED ORDER — VANCOMYCIN HCL 750 MG/150ML IV SOLN
750.0000 mg | Freq: Three times a day (TID) | INTRAVENOUS | Status: DC
  Administered 2019-07-26 – 2019-08-01 (×16): 750 mg via INTRAVENOUS
  Filled 2019-07-26 (×18): qty 150

## 2019-07-26 MED ORDER — ENOXAPARIN SODIUM 80 MG/0.8ML ~~LOC~~ SOLN
75.0000 mg | SUBCUTANEOUS | Status: DC
  Administered 2019-07-27: 75 mg via SUBCUTANEOUS
  Filled 2019-07-26: qty 0.8

## 2019-07-26 MED ORDER — BUSPIRONE HCL 5 MG PO TABS
15.0000 mg | ORAL_TABLET | Freq: Two times a day (BID) | ORAL | Status: DC
  Administered 2019-07-26 – 2019-08-01 (×4): 15 mg via ORAL
  Filled 2019-07-26 (×9): qty 3

## 2019-07-26 MED ORDER — ELETRIPTAN HYDROBROMIDE 40 MG PO TABS
40.0000 mg | ORAL_TABLET | ORAL | Status: DC | PRN
  Filled 2019-07-26: qty 1

## 2019-07-26 MED ORDER — SODIUM CHLORIDE 1 G PO TABS
1.0000 g | ORAL_TABLET | Freq: Two times a day (BID) | ORAL | Status: DC
  Administered 2019-07-26 – 2019-08-02 (×14): 1 g via ORAL
  Filled 2019-07-26 (×15): qty 1

## 2019-07-26 MED ORDER — MUPIROCIN 2 % EX OINT
1.0000 "application " | TOPICAL_OINTMENT | Freq: Two times a day (BID) | CUTANEOUS | Status: AC
  Administered 2019-07-26 – 2019-07-31 (×9): 1 via NASAL
  Filled 2019-07-26 (×5): qty 22

## 2019-07-26 MED ORDER — ENOXAPARIN SODIUM 40 MG/0.4ML ~~LOC~~ SOLN
40.0000 mg | SUBCUTANEOUS | Status: DC
  Administered 2019-07-26: 40 mg via SUBCUTANEOUS
  Filled 2019-07-26: qty 0.4

## 2019-07-26 MED ORDER — SODIUM CHLORIDE 0.9 % IV SOLN: 2.0000 g | Freq: Three times a day (TID) | INTRAVENOUS | Status: DC

## 2019-07-26 MED ORDER — FUROSEMIDE 80 MG PO TABS
80.0000 mg | ORAL_TABLET | Freq: Every day | ORAL | Status: DC
  Administered 2019-07-26: 80 mg via ORAL
  Filled 2019-07-26: qty 4

## 2019-07-26 MED ORDER — GABAPENTIN 600 MG PO TABS
1200.0000 mg | ORAL_TABLET | Freq: Three times a day (TID) | ORAL | Status: DC
  Administered 2019-07-26 – 2019-08-02 (×21): 1200 mg via ORAL
  Filled 2019-07-26 (×22): qty 2

## 2019-07-26 MED ORDER — CHLORHEXIDINE GLUCONATE CLOTH 2 % EX PADS: 6.0000 | MEDICATED_PAD | Freq: Every day | CUTANEOUS | Status: DC

## 2019-07-26 MED ORDER — METRONIDAZOLE IN NACL 5-0.79 MG/ML-% IV SOLN
500.0000 mg | Freq: Once | INTRAVENOUS | Status: AC
  Administered 2019-07-26: 500 mg via INTRAVENOUS
  Filled 2019-07-26: qty 100

## 2019-07-26 MED ORDER — PENTOXIFYLLINE ER 400 MG PO TBCR
400.0000 mg | EXTENDED_RELEASE_TABLET | Freq: Three times a day (TID) | ORAL | Status: DC
  Administered 2019-07-26 – 2019-08-02 (×21): 400 mg via ORAL
  Filled 2019-07-26 (×22): qty 1

## 2019-07-26 NOTE — ED Notes (Signed)
Ask patient for urine sample, patient stated that she did not need to urinate at this time. 

## 2019-07-26 NOTE — ED Notes (Signed)
This EMT attempted to stick pt x2 for blood with no success.

## 2019-07-26 NOTE — ED Triage Notes (Signed)
Pt brought to ED via EMS with weeping cellulitis in BLE, fever, and AMS. Pt's family states pt has been confused for the past day, slow to respond. EMS reports temp 103 tympanic, 40 resp, 100 HR, 16/90 BP, 98% on RA. Pt currently A&O x4. Peeling cellulitis present on BLE, extends from feet to upper thighs.

## 2019-07-26 NOTE — Progress Notes (Signed)
Lower extremity venous has been completed.   Preliminary results in CV Proc.   Abram Sander 07/26/2019 4:26 PM

## 2019-07-26 NOTE — ED Provider Notes (Addendum)
[facility] EMERGENCY DEPARTMENT Provider Note   CSN: [id number] Arrival date & time: 07/26/19  1213     History Chief Complaint  Patient presents with   Code [place] is a 49 y.o. female presenting for evaluation of leg pain/redness/swelling and fever.  Patient states for the past week, she has had worsening symptoms of her bilateral lower legs.  She reports there more swollen, painful, and red.  She has felt fevers and chills since last night.  She has not taken anything for her symptoms.  She denies new medications or change in her medicines.  She denies cough, chest pain, shortness breath, nausea, vomiting, dumping, urinary symptoms, normal bowel movements.  Additional history obtained from EMS.  EMS states family reported patient is more confused and not mentally at her baseline.  And EMS personnel who is familiar with the patient also reported the same. Additional history obtained from chart review.  Patient with a history of fibromyalgia, hypertension, hyperlipidemia, obesity, migraines, psoriasis on immunosuppression, history of GI bleed.  HPI     Past Medical History:  Diagnosis Date   Allergic rhinitis    Ankle fracture, right    Anxiety    ASCUS of cervix with negative high risk HPV 05/2018   Carpal tunnel syndrome    Cervical cancer ([place]) 1998   Fibromyalgia    Hyperlipidemia    Hypertension    Migraine headache    Obesity    Sleep apnea     Patient Active Problem List   Diagnosis Date Noted   Urinary urgency 04/04/2018   Chronic narcotic use 09/07/2016   Acute blood loss anemia 01/14/2016   Multiple gastric ulcers    PAN (polyarteritis nodosa) ([place]) 11/24/2015   Chronic gout of multiple sites 02/25/2014   GERD (gastroesophageal reflux disease) 01/08/2014   Routine general medical examination at a health care facility 04/23/2013   Bariatric surgery status 10/31/2012   Psoriasis 01/09/2012   DDD  (degenerative disc disease), lumbar 11/30/2011   Migraine headache    [name] (obstructive sleep apnea) 11/16/2010   HYPERGLYCEMIA, FASTING 10/08/2009   CONTRACTURE OF TENDON 08/17/2009   PARESTHESIA, HANDS 12/23/2008   Leg pain, right 11/05/2008   ADVERSE DRUG REACTION 04/03/2008   PERIPHERAL EDEMA 03/26/2008   Morbid obesity ([place]) 01/03/2007   Hyperlipidemia 09/22/2006   Fibromyalgia 09/20/2006   Anxiety and depression 06/28/2006    Past Surgical History:  Procedure Laterality Date   ABDOMINAL HYSTERECTOMY  10/1996   ABDOMINAL SURGERY  jan/11/2012   gastric bypass surgery   CARPAL TUNNEL RELEASE     bilateral    CESAREAN SECTION     CHOLECYSTECTOMY  07/1992   ESOPHAGOGASTRODUODENOSCOPY N/A 01/14/2016   Procedure: ESOPHAGOGASTRODUODENOSCOPY (EGD);  Surgeon: Mauri Pole, MD;  Location[facility] ENDOSCOPY;  Service: Endoscopy;  Laterality: N/A;     OB History    Gravida  1   Para  1   Term      Preterm      AB      Living  1     SAB      TAB      Ectopic      Multiple      Live Births              Family History  Problem Relation Age of Onset   Diabetes Mother    Emphysema Mother    Allergies Mother    Hypertension Mother  Heart disease Father    Allergies Father    Prostate cancer Father    Breast cancer Maternal Grandmother 58    Social History   Tobacco Use   Smoking status: Never Smoker   Smokeless tobacco: Never Used  Substance Use Topics   Alcohol use: No   Drug use: No    Home Medications Prior to Admission medications   Medication Sig Start Date End Date Taking? Authorizing Provider  Biotin 10 MG TABS Take 10 mg by mouth daily. 07/25/18  Yes [provider]  ALPRAZolam Duanne Moron) 0.5 MG tablet Take 0.5 mg by mouth 3 (three) times daily as needed for anxiety.  04/29/19   [provider]  ARIPiprazole (ABILIFY) 10 MG tablet Take 10 mg by mouth daily.    [provider]    Armodafinil 250 MG tablet Take 250 mg by mouth daily.     [provider]  buPROPion (WELLBUTRIN XL) 150 MG 24 hr tablet Take 1 tablet (150 mg total) by mouth daily. 05/02/19   [name], MD  busPIRone (BUSPAR) 30 MG tablet TAKE 1 TABLET BY MOUTH 2 TIMES DAILY Patient taking differently: Take 30 mg by mouth 2 (two) times daily.  07/10/19   [name], MD  BYSTOLIC 10 MG tablet TAKE 1 TABLET BY MOUTH EVERY DAY Patient taking differently: Take 10 mg by mouth daily. BETA BLOCKER 06/14/19   [name], MD  calcium citrate (CALCITRATE - DOSED IN MG ELEMENTAL CALCIUM) 950 MG tablet Take 1 tablet by mouth daily.    [provider]  clonazePAM (KLONOPIN) 1 MG tablet TAKE 1 TABLET BY MOUTH EVERY DAY Patient taking differently: Take 1 mg by mouth daily.  06/26/19   [name], MD  clotrimazole-betamethasone (LOTRISONE) cream Apply 1 application topically 2 (two) times daily. 03/01/19   [name], MD  cyclobenzaprine (FLEXERIL) 10 MG tablet TAKE 1 TABLET BY MOUTH 3 TIMES DAILY AS NEEDED Patient taking differently: Take 10 mg by mouth 3 (three) times daily as needed for muscle spasms.  07/10/19   [name], MD  diazepam (VALIUM) 5 MG tablet TAKE HALF TO ONE TABLET BY MOUTH 2 TIMES DAILY AS NEEDED FOR ANXIETY Patient taking differently: Take 2.5 mg by mouth 2 (two) times daily as needed for anxiety.  07/16/19   [name], MD  DULoxetine (CYMBALTA) 60 MG capsule Take 2 capsules (120 mg total) by mouth daily. 06/22/15   [name], MD  eletriptan (RELPAX) 40 MG tablet Take 1 tablet (40 mg total) by mouth as needed for migraine. may repeat in 2 hours if necessary 11/29/18 11/29/19  [name], MD  FEROSUL 325 (65 Fe) MG tablet TAKE 1 TABLET BY MOUTH EVERY DAY WITH BREAKFAST Patient taking differently: Take 325 mg by mouth daily with breakfast.  04/08/19   [name], MD  fluconazole (DIFLUCAN) 100 MG tablet Take  1 tablet (100 mg total) by mouth daily. 04/01/19   [name], MD  furosemide (LASIX) 40 MG tablet TAKE 2 AND 1/2 TABLETS BY MOUTH EVERY DAY Patient taking differently: Take 100 mg by mouth daily.  08/16/18   [name], MD  gabapentin (NEURONTIN) 600 MG tablet Take 1,200 mg by mouth 3 (three) times daily. 04/19/19   [provider]  halobetasol (ULTRAVATE) 0.05 % cream Apply topically 2 (two) times daily. 03/18/19   [name], MD  HYDROcodone-acetaminophen (NORCO) 10-325 MG tablet Take 1 tablet by  mouth every 6 (six) hours as needed. 07/22/19   [name], MD  meclizine (ANTIVERT) 25 MG tablet Take 1 tablet (25 mg total) by mouth 3 (three) times daily as needed for dizziness. 07/18/16   [name], MD  nitrofurantoin, macrocrystal-monohydrate, (MACROBID) 100 MG capsule Take 1 capsule (100 mg total) by mouth 2 (two) times daily. 07/02/19   Brunetta Jeans, PA-C  NYSTATIN powder APPLY 4 grams TO THE AFFECTED AREA 4 TIMES DAILY Patient taking differently: Apply 1 application topically 4 (four) times daily.  09/18/18   [name], MD  Oxcarbazepine (TRILEPTAL) 300 MG tablet Take 300 mg by mouth 2 (two) times daily.    [provider]  pantoprazole (PROTONIX) 40 MG tablet TAKE 1 TABLET BY MOUTH 2 TIMES DAILY Patient taking differently: Take 40 mg by mouth 2 (two) times daily.  04/01/19   [name], MD  pentoxifylline (TRENTAL) 400 MG CR tablet Take 400 mg by mouth 3 (three) times daily with meals.  03/22/17   [provider]  promethazine (PHENERGAN) 25 MG tablet TAKE 1 TABLET BY MOUTH EVERY 6 HOURS AS NEEDED FOR NAUSEA AND VOMITING Patient taking differently: Take 25 mg by mouth every 6 (six) hours as needed for nausea or vomiting.  03/08/19   [name], MD  rosuvastatin (CRESTOR) 20 MG tablet TAKE 1 TABLET BY MOUTH EVERY DAY Patient taking differently: Take 20 mg by mouth daily.  04/01/19   [name],  MD  SKYRIZI, 150 MG DOSE, 75 MG/0.83ML PSKT  04/07/19   [provider]    Allergies    Niacin, Sulfamethoxazole-trimethoprim, Aspirin, Bactrim, Benzoin compound, Cephalexin, Clindamycin/lincomycin, Doxycycline, Iohexol, Lisinopril, Naproxen, Pnu-imune [pneumococcal polysaccharide vaccine], and Sulfonamide derivatives  Review of Systems   Review of Systems  Constitutional: Positive for fever.  Skin: Positive for color change.  Neurological: Positive for weakness.  Psychiatric/Behavioral: Positive for confusion.  All other systems reviewed and are negative.   Physical Exam Updated Vital Signs BP 100/62    Pulse 91    Temp (!) 103 F (39.4 C)    Resp (!) 23    Ht 5' 2" (1.575 m)    Wt (!) 154.2 kg    SpO2 96%    BMI 62.19 kg/m   Physical Exam Vitals and nursing note reviewed.  Constitutional:      Appearance: She is well-developed. She is obese. She is ill-appearing.     Comments: Obese.  Appears ill  HENT:     Head: Normocephalic and atraumatic.  Eyes:     Conjunctiva/sclera: Conjunctivae normal.     Pupils: Pupils are equal, round, and reactive to light.  Cardiovascular:     Rate and Rhythm: Regular rhythm. Tachycardia present.     Pulses: Normal pulses.     Comments: Heart rate ~ 100 on my exam Pulmonary:     Effort: Pulmonary effort is normal. No respiratory distress.     Breath sounds: Normal breath sounds. No wheezing.  Abdominal:     General: There is no distension.     Palpations: Abdomen is soft. There is no mass.     Tenderness: There is no abdominal tenderness. There is no guarding or rebound.  Musculoskeletal:     Cervical back: Normal range of motion and neck supple.     Right lower leg: Edema present.     Left lower leg: Edema present.     Comments: See pictures below. Bilateral lower extremities erythematous, warmth,  and tender to the touch.  Multiple chronic appearing wounds without purulent drainage at this time.  Skin:    General: Skin is  warm and dry.     Capillary Refill: Capillary refill takes less than 2 seconds.  Neurological:     Mental Status: She is alert and oriented to person, place, and time.     Comments: Patient is alert and oriented x3.  No obvious confusion on my exam          ED Results / Procedures / Treatments   Labs (all labs ordered are listed, but only abnormal results are displayed) Labs Reviewed  COMPREHENSIVE METABOLIC PANEL - Abnormal; Notable for the following components:      Result Value   Sodium 122 (*)    Chloride 90 (*)    Glucose, Bld 111 (*)    Calcium 8.1 (*)    Total Protein 6.4 (*)    Albumin 2.8 (*)    All other components within normal limits  CBC WITH DIFFERENTIAL/PLATELET - Abnormal; Notable for the following components:   WBC 10.8 (*)    RBC 3.86 (*)    Hemoglobin 10.5 (*)    HCT 33.3 (*)    Neutro Abs 8.9 (*)    Monocytes Absolute 1.1 (*)    All other components within normal limits  POCT I-STAT EG7 - Abnormal; Notable for the following components:   pH, Ven 7.529 (*)    pCO2, Ven 31.9 (*)    pO2, Ven 71.0 (*)    Acid-Base Excess 4.0 (*)    Sodium 122 (*)    Calcium, Ion 1.03 (*)    HCT 34.0 (*)    Hemoglobin 11.6 (*)    All other components within normal limits  CULTURE, BLOOD (ROUTINE X 2)  CULTURE, BLOOD (ROUTINE X 2)  URINE CULTURE  SARS CORONAVIRUS 2 (TAT 6-24 HRS)  LACTIC ACID, PLASMA  APTT  PROTIME-INR  LACTIC ACID, PLASMA  URINALYSIS, ROUTINE W REFLEX MICROSCOPIC  I-STAT BETA HCG BLOOD, ED ([facility], [facility], AP ONLY)  I-STAT VENOUS BLOOD GAS, ED  POC SARS CORONAVIRUS 2 AG -  ED    EKG EKG Interpretation  Date/Time:  Friday July 26 2019 12:27:13 EST Ventricular Rate:  101 PR Interval:    QRS Duration: 91 QT Interval:  325 QTC Calculation: 422 R Axis:   60 Text Interpretation: Sinus tachycardia SINCE LAST TRACING HEART RATE HAS INCREASED Confirmed by Malvin Johns [phone]) on 07/26/2019 12:31:27 PM   Radiology DG Chest Port 1 View  Result  Date: 07/26/2019 CLINICAL DATA:  Fever. EXAM: PORTABLE CHEST 1 VIEW COMPARISON:  February 03, 2011. FINDINGS: The heart size and mediastinal contours are within normal limits. Both lungs are clear. The visualized skeletal structures are unremarkable. IMPRESSION: No active disease. Electronically Signed   By: Marijo Conception M.D.   On: 07/26/2019 12:55    Procedures .Critical Care Performed by: Franchot Heidelberg, PA-C Authorized by: Franchot Heidelberg, PA-C   Critical care provider statement:    Critical care time (minutes):  45   Critical care time was exclusive of:  Separately billable procedures and treating other patients and teaching time   Critical care was necessary to treat or prevent imminent or life-threatening deterioration of the following conditions:  Sepsis   Critical care was time spent personally by me on the following activities:  Blood draw for specimens, development of treatment plan with patient or surrogate, evaluation of patient's response to treatment, examination of patient, obtaining history  from patient or surrogate, ordering and performing treatments and interventions, ordering and review of laboratory studies, ordering and review of radiographic studies, pulse oximetry, re-evaluation of patient's condition and review of old charts   I assumed direction of critical care for this patient from another provider in my specialty: no   Comments:     Presented to the ER meeting SIRS criteria.  Concern for sepsis.  IV antibiotics started and patient to be admitted to the hospital.   (including critical care time)  Medications Ordered in ED Medications  vancomycin (VANCOREADY) IVPB 2000 mg/400 mL (2,000 mg Intravenous New Bag/Given 07/26/19 1348)  ceFEPIme (MAXIPIME) 2 g in sodium chloride 0.9 % 100 mL IVPB (has no administration in time range)  vancomycin (VANCOREADY) IVPB 750 mg/150 mL (has no administration in time range)  metroNIDAZOLE (FLAGYL) IVPB 500 mg (500 mg  Intravenous New Bag/Given 07/26/19 1306)  ceFEPIme (MAXIPIME) 2 g in sodium chloride 0.9 % 100 mL IVPB (0 g Intravenous Stopped 07/26/19 1346)    ED Course  I have reviewed the triage vital signs and the nursing notes.  Pertinent labs & imaging results that were available during my care of the patient were reviewed by me and considered in my medical decision making (see chart for details).    MDM Rules/Calculators/A&P                      Patient presenting to the emergency room for leg redness/swelling and fever.  Additionally, he notes states family reports patient is more confused than normal.  She meets SIRS criteria, thus code sepsis was called.  Antibiotics started, but not fluid boluses patient's blood pressure is stable.  Will obtain labs, urine, chest x-ray.  Labs interpreted by me.  Mild leukocytosis at 10.  Lactic is normal.  Sodium low at 122.  Chest x-ray viewed interpreted by me, no pneumonia or infection effusion, cardiomegaly.  EKG unchanged from previous.  Heart rate improving with antibiotics.  Tylenol was given prior to arrival, thus no further fever treatment was given at this time.  Rapid Covid negative.  As patient met SIRS criteria and had reports of confusion, will call for admission.  Discussed with Dr. Roosevelt Locks from triad hospitalist service, patient to be admitted.  Final Clinical Impression(s) / ED Diagnoses Final diagnoses:  Sepsis, due to unspecified organism, unspecified whether acute organ dysfunction present [facility])  Cellulitis of lower extremity, unspecified laterality    Rx / DC Orders ED Discharge Orders    None       Franchot Heidelberg, PA-C 07/26/19 Lynd, Amyra Vantuyl, PA-C 07/26/19 1452    Malvin Johns, MD 07/26/19 1506

## 2019-07-26 NOTE — Progress Notes (Signed)
Pharmacy Antibiotic Note  [name] is a 49 y.o. female admitted on 07/26/2019 with sepsis.  Pharmacy has been consulted for vancomycin and aztreonam dosing.  Rxn to keflex noted as cutaneous symptoms only, per Immunologist at [facility] no true beta lactam allergy d/t pcn tolerance, however recommended to avoid cephalosporins with similar R1/R2 side chains.  Cefepime as 4th gen with different side chains and will trial.    Plan: Vancomycin 2000 mg IV x 1, then 750 mg IV every 8 hours Goal AUC 400-550. Expected AUC: 484 SCr used: 0.8 Cefepime 2g IV every 8 hours Monitor renal function, Cx and clinical progression to narrow Vancomycin levels at steady state   Height: 5\' 2"  (157.5 cm) Weight: (!) 340 lb (154.2 kg) IBW/kg (Calculated) : 50.1  Temp (24hrs), Avg:103 F (39.4 C), Min:103 F (39.4 C), Max:103 F (39.4 C)  No results for input(s): WBC, CREATININE, LATICACIDVEN, VANCOTROUGH, VANCOPEAK, VANCORANDOM, GENTTROUGH, GENTPEAK, GENTRANDOM, TOBRATROUGH, TOBRAPEAK, TOBRARND, AMIKACINPEAK, AMIKACINTROU, AMIKACIN in the last 168 hours.  CrCl cannot be calculated (Patient's most recent lab result is older than the maximum 21 days allowed.).    Allergies  Allergen Reactions  . Niacin Other (See Comments)    REACTION: Swelling, problems breathing  . Sulfamethoxazole-Trimethoprim Other (See Comments)    REACTION: Rash, throat closed, eyes swelled.  . Aspirin Hives  . Bactrim Hives  . Benzoin Compound Rash  . Cephalexin Hives  . Clindamycin/Lincomycin Hives  . Doxycycline Hives  . Iohexol Hives    Pt treated with PO benedryl  . Lisinopril Hives  . Naproxen Hives  . Pnu-Imune [Pneumococcal Polysaccharide Vaccine] Rash  . Sulfonamide Derivatives Rash    Antimicrobials this admission: Vanc 3/12>> Cefepime 3/12>>  Dose adjustments this admission: n/a  Microbiology results: 3/12 BCx: sent 3/12 UCx: sent  Bertis Ruddy, PharmD Clinical Pharmacist Please check AMION for all [place] numbers 07/26/2019 12:46 PM

## 2019-07-26 NOTE — H&P (Addendum)
History and Physical    [name] [id number] DOB: 1970-06-24 DOA: 07/26/2019  PCP: [name], MD (Confirm with patient/family/NH records and if not entered, this has to be entered at [facility] point of entry) Patient coming from: Home  I have personally briefly reviewed patient's old medical records in [place]  Chief Complaint: Leg pain and rash and fever  HPI: [name] is a 49 y.o. female with medical history significant Polyarteritis Nodosa, HTN, depression, came with leg pain/redness/swelling and fever. Pt has chronic leg discoloration and occasional ulcers, but seldom infections. She has been following with specialist at [facility] and in the past she failed steroid and Humira. From late last year she was started on [place], which she responded well and she is to get the 3rd dose next week. Patient states for the past week, she has had worsening rash bilateral lower legs and more swollen, painful. She reported that this looked like another episode of Polyarteritis Nodosa flare up, which usually went away in 2-3 days, but not this time.  She has felt fevers and chills since last night.  Family reported that pt became confused and forgetful last night and this morning and sent her to ER.  She denies cough, chest pain, shortness breath, nausea, vomiting, dumping, urinary symptoms, no diarrhea.   ED Course: Code sepsis called. WBC borderline elevated, Lactic acid WNL. BP stable.  Review of Systems: As per HPI otherwise 10 point review of systems negative.   Past Medical History:  Diagnosis Date  . Allergic rhinitis   . Ankle fracture, right   . Anxiety   . ASCUS of cervix with negative high risk HPV 05/2018  . Carpal tunnel syndrome   . Cervical cancer ([place]) 1998  . Fibromyalgia   . Hyperlipidemia   . Hypertension   . Migraine headache   . Obesity   . Sleep apnea     Past Surgical History:  Procedure Laterality Date  . ABDOMINAL HYSTERECTOMY  10/1996  .  ABDOMINAL SURGERY  jan/11/2012   gastric bypass surgery  . CARPAL TUNNEL RELEASE     bilateral   . CESAREAN SECTION    . CHOLECYSTECTOMY  07/1992  . ESOPHAGOGASTRODUODENOSCOPY N/A 01/14/2016   Procedure: ESOPHAGOGASTRODUODENOSCOPY (EGD);  Surgeon: Mauri Pole, MD;  Location[facility] ENDOSCOPY;  Service: Endoscopy;  Laterality: N/A;     reports that she has never smoked. She has never used smokeless tobacco. She reports that she does not drink alcohol or use drugs.  Allergies  Allergen Reactions  . Niacin Anaphylaxis, Swelling and Other (See Comments)    REACTION: Swelling, problems breathing  . Sulfamethoxazole-Trimethoprim Anaphylaxis, Swelling, Rash and Other (See Comments)    REACTION: Rash, throat closed, eyes swelled.  . Aspirin Hives  . Bactrim Hives  . Benzoin Compound Rash  . Cephalexin Hives  . Clindamycin/Lincomycin Hives  . Doxycycline Hives  . Iohexol Hives    Pt treated with PO benedryl  . Lisinopril Hives  . Naproxen Hives  . Pnu-Imune [Pneumococcal Polysaccharide Vaccine] Rash  . Sulfonamide Derivatives Rash    Family History  Problem Relation Age of Onset  . Diabetes Mother   . Emphysema Mother   . Allergies Mother   . Hypertension Mother   . Heart disease Father   . Allergies Father   . Prostate cancer Father   . Breast cancer Maternal Grandmother 58     Prior to Admission medications   Medication Sig Start Date End Date Taking? Authorizing  Provider  ARIPiprazole (ABILIFY) 10 MG tablet Take 10 mg by mouth daily.   Yes [provider]  Armodafinil 250 MG tablet Take 250 mg by mouth daily.    Yes [provider]  Biotin 10 MG TABS Take 10 mg by mouth daily. 07/25/18  Yes [provider]  buPROPion (WELLBUTRIN XL) 150 MG 24 hr tablet Take 1 tablet (150 mg total) by mouth daily. 05/02/19  Yes [name], MD  busPIRone (BUSPAR) 30 MG tablet TAKE 1 TABLET BY MOUTH 2 TIMES DAILY Patient taking differently: Take 30 mg by  mouth 2 (two) times daily.  07/10/19  Yes [name], MD  BYSTOLIC 10 MG tablet TAKE 1 TABLET BY MOUTH EVERY DAY Patient taking differently: Take 10 mg by mouth daily. BETA BLOCKER 06/14/19  Yes [name], MD  calcium citrate (CALCITRATE - DOSED IN MG ELEMENTAL CALCIUM) 950 MG tablet Take 1 tablet by mouth daily.   Yes [provider]  clonazePAM (KLONOPIN) 1 MG tablet TAKE 1 TABLET BY MOUTH EVERY DAY Patient taking differently: Take 1 mg by mouth at bedtime.  06/26/19  Yes [name], MD  cyclobenzaprine (FLEXERIL) 10 MG tablet TAKE 1 TABLET BY MOUTH 3 TIMES DAILY AS NEEDED Patient taking differently: Take 10 mg by mouth 3 (three) times daily as needed for muscle spasms.  07/10/19  Yes [name], MD  diazepam (VALIUM) 5 MG tablet TAKE HALF TO ONE TABLET BY MOUTH 2 TIMES DAILY AS NEEDED FOR ANXIETY Patient taking differently: Take 2.5 mg by mouth 2 (two) times daily as needed for anxiety.  07/16/19  Yes [name], MD  DULoxetine (CYMBALTA) 60 MG capsule Take 2 capsules (120 mg total) by mouth daily. 06/22/15  Yes [name], MD  eletriptan (RELPAX) 40 MG tablet Take 1 tablet (40 mg total) by mouth as needed for migraine. may repeat in 2 hours if necessary 11/29/18 11/29/19 Yes [name], MD  FEROSUL 325 (65 Fe) MG tablet TAKE 1 TABLET BY MOUTH EVERY DAY WITH BREAKFAST Patient taking differently: Take 325 mg by mouth daily with breakfast.  04/08/19  Yes [name], MD  furosemide (LASIX) 40 MG tablet TAKE 2 AND 1/2 TABLETS BY MOUTH EVERY DAY Patient taking differently: Take 80 mg by mouth daily.  08/16/18  Yes [name], MD  gabapentin (NEURONTIN) 600 MG tablet Take 1,200 mg by mouth 3 (three) times daily. 04/19/19  Yes [provider]  halobetasol (ULTRAVATE) 0.05 % cream Apply topically 2 (two) times daily. 03/18/19  Yes [name], MD  HYDROcodone-acetaminophen (NORCO) 10-325 MG tablet Take 1 tablet  by mouth every 6 (six) hours as needed. Patient taking differently: Take 1 tablet by mouth every 6 (six) hours as needed for moderate pain.  07/22/19  Yes [name], MD  meclizine (ANTIVERT) 25 MG tablet Take 1 tablet (25 mg total) by mouth 3 (three) times daily as needed for dizziness. 07/18/16  Yes [name], MD  NYSTATIN powder APPLY 4 grams TO THE AFFECTED AREA 4 TIMES DAILY Patient taking differently: Apply 1 application topically 4 (four) times daily.  09/18/18  Yes [name], MD  Oxcarbazepine (TRILEPTAL) 300 MG tablet Take 300 mg by mouth 2 (two) times daily.   Yes [provider]  pantoprazole (PROTONIX) 40 MG tablet TAKE 1 TABLET BY MOUTH 2 TIMES DAILY Patient taking differently: Take 40 mg by mouth 2 (two) times daily.  04/01/19  Yes [name], MD  pentoxifylline (TRENTAL) 400 MG CR tablet Take 400 mg by mouth 3 (three) times daily with meals.  03/22/17  Yes [provider]  promethazine (PHENERGAN) 25 MG tablet TAKE 1 TABLET BY MOUTH EVERY 6 HOURS AS NEEDED FOR NAUSEA AND VOMITING Patient taking differently: Take 25 mg by mouth every 6 (six) hours as needed for nausea or vomiting.  03/08/19  Yes [name], MD  rosuvastatin (CRESTOR) 20 MG tablet TAKE 1 TABLET BY MOUTH EVERY DAY Patient taking differently: Take 20 mg by mouth every evening.  04/01/19  Yes [name], MD  SKYRIZI, 150 MG DOSE, 75 MG/0.83ML PSKT Inject 150 mg into the skin every 3 (three) months.  04/07/19  Yes [provider]  clotrimazole-betamethasone (LOTRISONE) cream Apply 1 application topically 2 (two) times daily. Patient not taking: Reported on 07/26/2019 03/01/19   [name], MD    Physical Exam: Vitals:   07/26/19 1300 07/26/19 1330 07/26/19 1400 07/26/19 1430  BP: 121/62 100/62 115/71 133/72  Pulse: 95 91 85 84  Resp:   14   Temp:      SpO2: 96% 96% 93% 97%  Weight:      Height:        Constitutional: NAD, calm,  comfortable Vitals:   07/26/19 1300 07/26/19 1330 07/26/19 1400 07/26/19 1430  BP: 121/62 100/62 115/71 133/72  Pulse: 95 91 85 84  Resp:   14   Temp:      SpO2: 96% 96% 93% 97%  Weight:      Height:       Eyes: PERRL, lids and conjunctivae normal ENMT: Mucous membranes are moist. Posterior pharynx clear of any exudate or lesions.Normal dentition.  Neck: normal, supple, no masses, no thyromegaly Respiratory: clear to auscultation bilaterally, no wheezing, no crackles. Normal respiratory effort. No accessory muscle use.  Cardiovascular: Regular rate and rhythm, no murmurs / rubs / gallops. 2+ extremity edema. 2+ pedal pulses. No carotid bruits.  Abdomen: no tenderness, no masses palpated. No hepatosplenomegaly. Bowel sounds positive.  Musculoskeletal: no clubbing / cyanosis. No joint deformity upper and lower extremities. Good ROM, no contractures. Normal muscle tone.  Skin: Chronic rash and shallow ulcers as shown, 2+ edema Neurologic: CN 2-12 grossly intact. Sensation intact, DTR normal. Strength 5/5 in all 4.  Psychiatric: Normal judgment and insight. Alert and oriented x 3. Normal mood.         Labs on Admission: I have personally reviewed following labs and imaging studies  CBC: Recent Labs  Lab 07/26/19 1219 07/26/19 1248  WBC 10.8*  --   NEUTROABS 8.9*  --   HGB 10.5* 11.6*  HCT 33.3* 34.0*  MCV 86.3  --   PLT 294  --    Basic Metabolic Panel: Recent Labs  Lab 07/26/19 1219 07/26/19 1248  NA 122* 122*  K 4.9 4.7  CL 90*  --   CO2 23  --   GLUCOSE 111*  --   BUN 10  --   CREATININE 0.61  --   CALCIUM 8.1*  --    GFR: Estimated Creatinine Clearance: 123.1 mL/min (by C-G formula based on SCr of 0.61 mg/dL). Liver Function Tests: Recent Labs  Lab 07/26/19 1219  AST 22  ALT 15  ALKPHOS 113  BILITOT 0.4  PROT 6.4*  ALBUMIN 2.8*   No results for input(s): LIPASE, AMYLASE in the last 168 hours. No results for input(s): AMMONIA in the last 168 hours.  Coagulation Profile: Recent Labs  Lab 07/26/19 1219  INR 1.2   Cardiac Enzymes: No results for input(s): CKTOTAL, CKMB, CKMBINDEX, TROPONINI in the last 168 hours. BNP (last 3 results) No results for input(s): PROBNP in the last 8760 hours. HbA1C: No results for input(s): HGBA1C in the last 72 hours. CBG: No results for input(s): GLUCAP in the last 168 hours. Lipid Profile: No results for input(s): CHOL, HDL, LDLCALC, TRIG, CHOLHDL, LDLDIRECT in the last 72 hours. Thyroid Function Tests: No results for input(s): TSH, T4TOTAL, FREET4, T3FREE, THYROIDAB in the last 72 hours. Anemia Panel: No results for input(s): VITAMINB12, FOLATE, FERRITIN, TIBC, IRON, RETICCTPCT in the last 72 hours. Urine analysis:    Component Value Date/Time   COLORURINE YELLOW 07/26/2019 1513   APPEARANCEUR CLEAR 07/26/2019 1513   LABSPEC 1.018 07/26/2019 1513   PHURINE 7.0 07/26/2019 1513   GLUCOSEU NEGATIVE 07/26/2019 1513   HGBUR NEGATIVE 07/26/2019 1513   HGBUR negative 01/05/2009 1414   BILIRUBINUR NEGATIVE 07/26/2019 1513   BILIRUBINUR negative 04/27/2018 0840   KETONESUR NEGATIVE 07/26/2019 1513   PROTEINUR NEGATIVE 07/26/2019 1513   UROBILINOGEN 1.0 04/27/2018 0840   UROBILINOGEN 0.2 01/05/2009 1414   NITRITE NEGATIVE 07/26/2019 1513   LEUKOCYTESUR NEGATIVE 07/26/2019 1513    Radiological Exams on Admission: DG Chest Port 1 View  Result Date: 07/26/2019 CLINICAL DATA:  Fever. EXAM: PORTABLE CHEST 1 VIEW COMPARISON:  February 03, 2011. FINDINGS: The heart size and mediastinal contours are within normal limits. Both lungs are clear. The visualized skeletal structures are unremarkable. IMPRESSION: No active disease. Electronically Signed   By: Marijo Conception M.D.   On: 07/26/2019 12:55    EKG: Independently reviewed. Sinus tachy  Assessment/Plan Active Problems:   Cellulitis   Sepsis ([place])  Sepsis improved Source is the B/L cellulitis on top of the flare up of PAN (Polyarteritis  Nodosa) MRSA screen and ASO Continue Vanco, D/C cefepime and Flagyl, likely can downgrade to PO ABX in 1-2 days She has chronic PAN related wound and she told me that she never had severe infection despite the chronic wounds and last time she took ABX for the leg infection was 2 years ago Wound care consult DVT study, x-ray rule out foreign body or gas  Acute on chronic hyponatremia She takes 6 SSRI and SNRI for depression, likely causing SIADH Check serum and urine osmolarity She is a bit fluid overload and her lactic acid level normal, despite having active infection Continue Lasix Fluid restriction Recheck Na level tonight and AM, avoid abrupt correction.  Polyarteritis Nodosa F/U at [facility] next week for the 3rd dose of [place]  Depression Pt agreed to cut down some of the SSRI and SNRI for now Denied suicidal  HTN BP stabilized, continue home BP meds.  Morbid obesity Outpt bariatric evaluation    DVT prophylaxis: Lovenox Code Status: Full Family Communication: None at bedside Disposition Plan: Once Na level stabilized and cellulitis improved, she can go home Consults called: None Admission status: Tele admit   Lequita Halt MD [facility] Pager [phone]    07/26/2019, 3:27 PM

## 2019-07-26 NOTE — ED Notes (Signed)
Portable xray at bedside.

## 2019-07-27 ENCOUNTER — Inpatient Hospital Stay (HOSPITAL_COMMUNITY): Payer: Medicare HMO

## 2019-07-27 DIAGNOSIS — F329 Major depressive disorder, single episode, unspecified: Secondary | ICD-10-CM

## 2019-07-27 DIAGNOSIS — F419 Anxiety disorder, unspecified: Secondary | ICD-10-CM

## 2019-07-27 DIAGNOSIS — E871 Hypo-osmolality and hyponatremia: Secondary | ICD-10-CM

## 2019-07-27 DIAGNOSIS — I10 Essential (primary) hypertension: Secondary | ICD-10-CM

## 2019-07-27 DIAGNOSIS — A419 Sepsis, unspecified organism: Secondary | ICD-10-CM

## 2019-07-27 DIAGNOSIS — I5032 Chronic diastolic (congestive) heart failure: Secondary | ICD-10-CM

## 2019-07-27 DIAGNOSIS — M300 Polyarteritis nodosa: Secondary | ICD-10-CM

## 2019-07-27 DIAGNOSIS — L03119 Cellulitis of unspecified part of limb: Secondary | ICD-10-CM

## 2019-07-27 LAB — BASIC METABOLIC PANEL
Anion gap: 12 (ref 5–15)
BUN: 9 mg/dL (ref 6–20)
CO2: 23 mmol/L (ref 22–32)
Calcium: 8.1 mg/dL — ABNORMAL LOW (ref 8.9–10.3)
Chloride: 94 mmol/L — ABNORMAL LOW (ref 98–111)
Creatinine, Ser: 0.61 mg/dL (ref 0.44–1.00)
GFR calc Af Amer: >60 mL/min (ref >60)
GFR calc non Af Amer: >60 mL/min (ref >60)
Glucose, Bld: 111 mg/dL — ABNORMAL HIGH (ref 70–99)
Potassium: 4 mmol/L (ref 3.5–5.1)
Sodium: 129 mmol/L — ABNORMAL LOW (ref 135–145)

## 2019-07-27 LAB — ECHOCARDIOGRAM COMPLETE
Height: 62 in
Weight: 5784 oz

## 2019-07-27 LAB — CBC
HCT: 33.6 % — ABNORMAL LOW (ref 36.0–46.0)
Hemoglobin: 10.7 g/dL — ABNORMAL LOW (ref 12.0–15.0)
MCH: 26.8 pg (ref 26.0–34.0)
MCHC: 31.8 g/dL (ref 30.0–36.0)
MCV: 84 fL (ref 80.0–100.0)
Platelets: 284 10*3/uL (ref 150–400)
RBC: 4 MIL/uL (ref 3.87–5.11)
RDW: 15.2 % (ref 11.5–15.5)
WBC: 6.8 10*3/uL (ref 4.0–10.5)
nRBC: 0 % (ref 0.0–0.2)

## 2019-07-27 LAB — SODIUM, URINE, RANDOM: Sodium, Ur: 83 mmol/L

## 2019-07-27 LAB — URINE CULTURE: Culture: NO GROWTH

## 2019-07-27 LAB — HEMOGLOBIN A1C
Hgb A1c MFr Bld: 5.2 % (ref 4.8–5.6)
Mean Plasma Glucose: 102.54 mg/dL

## 2019-07-27 LAB — OSMOLALITY, URINE: Osmolality, Ur: 584 mOsm/kg (ref 300–900)

## 2019-07-27 MED ORDER — POTASSIUM CHLORIDE CRYS ER 20 MEQ PO TBCR
20.0000 meq | EXTENDED_RELEASE_TABLET | Freq: Two times a day (BID) | ORAL | Status: AC
  Administered 2019-07-27 (×2): 20 meq via ORAL
  Filled 2019-07-27 (×2): qty 1

## 2019-07-27 MED ORDER — ENOXAPARIN SODIUM 80 MG/0.8ML ~~LOC~~ SOLN
80.0000 mg | SUBCUTANEOUS | Status: DC
  Administered 2019-07-28 – 2019-07-30 (×3): 80 mg via SUBCUTANEOUS
  Filled 2019-07-27 (×3): qty 0.8

## 2019-07-27 MED ORDER — FUROSEMIDE 10 MG/ML IJ SOLN
60.0000 mg | Freq: Two times a day (BID) | INTRAMUSCULAR | Status: DC
  Administered 2019-07-27 – 2019-07-28 (×3): 60 mg via INTRAVENOUS
  Filled 2019-07-27 (×3): qty 6

## 2019-07-27 MED ORDER — SODIUM CHLORIDE 0.9 % IV SOLN
2.0000 g | INTRAVENOUS | Status: DC
  Administered 2019-07-27 – 2019-08-02 (×7): 2 g via INTRAVENOUS
  Filled 2019-07-27: qty 2
  Filled 2019-07-27: qty 20
  Filled 2019-07-27 (×2): qty 2
  Filled 2019-07-27: qty 20
  Filled 2019-07-27 (×2): qty 2

## 2019-07-27 MED ORDER — IRBESARTAN 75 MG PO TABS
75.0000 mg | ORAL_TABLET | Freq: Every day | ORAL | Status: DC
  Administered 2019-07-27 – 2019-08-02 (×7): 75 mg via ORAL
  Filled 2019-07-27 (×7): qty 1

## 2019-07-27 NOTE — Evaluation (Signed)
Physical Therapy Evaluation Patient Details Name: [name] MRN: [id number] DOB: 1970/09/09 Today's Date: 07/27/2019   History of Present Illness  49 y.o. female medical history of polyarthritis nodosa depression left pain redness and swelling and fever that started a week prior to admission. Presented to hospital with leg pain, rash and fever. Pt dx with sepsis sec to BLE cellulitis  Clinical Impression   Pt admitted with above diagnosis. PTA was living home with her father and also her son (mother recently passed away), pt states was independent with all ADLs and at times used a cane for stability with ambulation. Pt currently with functional limitations due to the deficits listed below (see PT Problem List). This pm did well with mobility needed set up and min guard assist for all mobility including ambulating 34ft around room w/ IV pole. Pt is limited by independence, activity tolerance, some strength deficits also. Pt will benefit from skilled PT to increase her overall strength, activity tolerance, independence and safety with mobility to allow discharge to the venue listed below.       Follow Up Recommendations Home health PT    Equipment Recommendations  None recommended by PT    Recommendations for Other Services OT consult     Precautions / Restrictions Precautions Precautions: Fall Restrictions Weight Bearing Restrictions: No      Mobility  Bed Mobility Overal bed mobility: Needs Assistance Bed Mobility: Supine to Sit;Sit to Supine     Supine to sit: Supervision Sit to supine: Supervision      Transfers Overall transfer level: Needs assistance Equipment used: Rolling walker (2 wheeled) Transfers: Sit to/from Stand Sit to Stand: Min guard;Supervision            Ambulation/Gait Ambulation/Gait assistance: Min Gaffer (Feet): 20 Feet Assistive device: IV Pole Gait Pattern/deviations: Wide base of support;Shuffle Gait velocity:  dec      Stairs            Wheelchair Mobility    Modified Rankin (Stroke Patients Only)       Balance Overall balance assessment: Needs assistance Sitting-balance support: Feet supported Sitting balance-Leahy Scale: Good     Standing balance support: During functional activity Standing balance-Leahy Scale: Fair                               Pertinent Vitals/Pain Pain Assessment: 0-10 Pain Score: 8  Pain Location: B legs w/ no activity Pain Descriptors / Indicators: Burning(stinging pain) Pain Intervention(s): Limited activity within patient's tolerance;Monitored during session    Home Living Family/patient expects to be discharged to:: Private residence Living Arrangements: Parent;Children Available Help at Discharge: Family Type of Home: House Home Access: Level entry     Home Layout: Two level;Able to live on main level with bedroom/bathroom Home Equipment: Kasandra Knudsen - single point;Walker - 2 wheels;Walker - 4 wheels;Bedside commode;Shower seat;Grab bars - tub/shower;Wheelchair - manual      Prior Function Level of Independence: Independent with assistive device(s)               Hand Dominance   Dominant Hand: Right    Extremity/Trunk Assessment   Upper Extremity Assessment Upper Extremity Assessment: Overall WFL for tasks assessed    Lower Extremity Assessment Lower Extremity Assessment: Overall WFL for tasks assessed       Communication   Communication: No difficulties  Cognition Arousal/Alertness: Awake/alert Behavior During Therapy: WFL for tasks assessed/performed Overall Cognitive Status: No  family/caregiver present to determine baseline cognitive functioning                                        General Comments General comments (skin integrity, edema, etc.): BLE with cellulitis, arrived to find pt in bed, bed is soaked with both urine and also fluid from BLE weeping    Exercises      Assessment/Plan    PT Assessment Patient needs continued PT services  PT Problem List Decreased strength;Decreased activity tolerance;Decreased safety awareness;Decreased balance;Decreased mobility;Decreased coordination       PT Treatment Interventions DME instruction;Gait training;Functional mobility training;Therapeutic activities;Therapeutic exercise;Balance training;Neuromuscular re-education;Patient/family education    PT Goals (Current goals can be found in the Care Plan section)  Acute Rehab PT Goals Patient Stated Goal: to get better PT Goal Formulation: With patient Time For Goal Achievement: 08/10/19 Potential to Achieve Goals: Good    Frequency Min 3X/week   Barriers to discharge        Co-evaluation               AM-PAC PT "6 Clicks" Mobility  Outcome Measure Help needed turning from your back to your side while in a flat bed without using bedrails?: A Little Help needed moving from lying on your back to sitting on the side of a flat bed without using bedrails?: A Little Help needed moving to and from a bed to a chair (including a wheelchair)?: A Little Help needed standing up from a chair using your arms (e.g., wheelchair or bedside chair)?: A Little Help needed to walk in hospital room?: A Little Help needed climbing 3-5 steps with a railing? : A Lot 6 Click Score: 17    End of Session   Activity Tolerance: Patient limited by fatigue;Patient tolerated treatment well Patient left: in bed;with call bell/phone within reach   PT Visit Diagnosis: Other abnormalities of gait and mobility (R26.89)    Time: [id number] PT Time Calculation (min) (ACUTE ONLY): 28 min   Charges:   PT Evaluation $PT Eval Moderate Complexity: 1 Mod PT Treatments $Therapeutic Activity: 8-22 mins        Horald Chestnut, PT   Delford Field 07/27/2019, 4:33 PM

## 2019-07-27 NOTE — Consult Note (Signed)
WOC Nurse Consult Note: Reason for Consult:bilateral edema, erythema, lichenification at malleolus and scattered partial thickness wounds that are weeping.  Wound type:Chrnoic venous insufficiency, infectious Pressure Injury POA: N/A Measurement: See above.  Open areas are partial thickness and are scattered. Wound [id number], moist Drainage (amount, consistency, odor) serous Periwound:edematous, erythematous and with lichenification at bilateral lateral and medial malleolar areas Dressing procedure/placement/frequency: I have implemented a conservative POC for this hospitalization, she will be following up with her medical team at [facility] post discharge.  Twice daily cleansing and dressing of the bilateral LEs with an antimicrobial and astringent nonadherent dressing to the affected areas (xeroform) and topped with dry gauze dressing will be secured with Kerlix roll gauze wrapped from just below toes to just below knees. This will be covered with a 6-inch ACE bandage wrapped in a similar manner for light compression.  The patient's heels are to be floated off of the bed surface both while in bed and in the recliner chair to avoid pressure injury.  [place] nursing team will not follow, but will remain available to this patient, the nursing and medical teams.  Please re-consult if needed. Thanks, Maudie Flakes, MSN, RN, [place], Arther Abbott  Pager# [phone]

## 2019-07-27 NOTE — Progress Notes (Signed)
  Echocardiogram 2D Echocardiogram has been performed.  Jaxsyn, [place] 07/27/2019, 11:45 AM

## 2019-07-27 NOTE — Progress Notes (Signed)
[facility] PROGRESS NOTE    Progress Note  [name]  [id number] DOB: [birth year] DOA: 07/26/2019 PCP: [name], MD     Brief Narrative:   [name] is an 49 y.o. female medical history of polyarthritis nodosa depression left pain redness and swelling and fever that started a week prior to admission.  Assessment/Plan:   Sepsis due to bilateral lower extremity cellulitis She was started empirically on IV vancomycin and cefepime.  X-ray of the left and right foot showed no osteomyelitis, but a lot of edema. I will de-escalate her antibiotic coverage to IV Vanco and Rocephin, will consult wound care. We will try to keep legs elevated above heart level lower extremity Doppler was negative for DVT. Out of bed to chair.  Hypervolemic hyponatremia: Serum osmolarity and urine lites are pending.  This is likely not SIADH. She is definitely fluid overloaded on physical exam. We will increase her IV Lasix, monitor strict I's and O's and daily weights.  Anxiety and depression: Continue current home medications.  Morbid obesity ([place]) [place].  Chronic diastolic heart failure: Continue Lasix, monitor electrolytes as needed. Strict I's and O's and daily weights restrict fluid. chec 2 -d echo  Essential hypertension: Blood pressure is significantly elevated especially her diastolic blood pressure. She is currently on labetalol and IV Lasix was started on Avapro. Check a basic metabolic panel tomorrow morning.  Polyarthritis nodosa: She will continue her [facility] at [facility], noted.  Anxiety and depression Continue Wellbutrin BuSpar and Klonopin discontinue Valium.   Morbid obesity Noted counseled.  Stage II bilateral lower extremity wounds present on admission RN Pressure Injury Documentation:    Estimated body mass index is 66.12 kg/m as calculated from the following:   Height as of this encounter: 5\' 2"  (1.575 m).   Weight as of this  encounter: 164 kg. Malnutrition Type:      Malnutrition Characteristics:      Nutrition Interventions:       DVT prophylaxis: lovenox Family Communication:none Disposition Plan/Barrier to D/C:   Code Status:     Code Status Orders  (From admission, onward)         Start     Ordered   07/26/19 1523  Full code  Continuous     07/26/19 1524        Code Status History    Date Active Date Inactive Code Status Order ID Comments User Context   01/14/2016 0119 01/15/2016 1615 Full Code [id number]  Toy Baker, MD Inpatient   Advance Care Planning Activity        IV Access:    Peripheral IV   Procedures and diagnostic studies:   DG Chest Port 1 View  Result Date: 07/26/2019 CLINICAL DATA:  Fever. EXAM: PORTABLE CHEST 1 VIEW COMPARISON:  February 03, 2011. FINDINGS: The heart size and mediastinal contours are within normal limits. Both lungs are clear. The visualized skeletal structures are unremarkable. IMPRESSION: No active disease. Electronically Signed   By: Marijo Conception M.D.   On: 07/26/2019 12:55   DG Tibia/Fibula Left Port  Result Date: 07/26/2019 CLINICAL DATA:  Weeping cellulitis. EXAM: PORTABLE LEFT TIBIA AND FIBULA - 2 VIEW COMPARISON:  None. FINDINGS: Cortical margins of the tibia and fibula are intact. No periosteal reaction or bony destruction. No fracture. Osteoarthritis of the knee. Generalized subcutaneous soft tissue edema throughout the lower extremity. No soft tissue air or radiopaque foreign body. IMPRESSION: Generalized soft tissue edema. No acute osseous abnormality. No  soft tissue air or radiopaque foreign body. Electronically Signed   By: Keith Rake M.D.   On: 07/26/2019 16:27   DG Tibia/Fibula Right Port  Result Date: 07/26/2019 CLINICAL DATA:  Weeping cellulitis. EXAM: PORTABLE RIGHT TIBIA AND FIBULA - 2 VIEW COMPARISON:  None. FINDINGS: Cortical margins of the tibia and fibula are intact. No periosteal reaction or bony  destruction. No fracture. Osteoarthritis of the knee. The ankle is not included on the cross-table lateral view. Generalized subcutaneous soft tissue edema throughout the lower extremity. No soft tissue air or radiopaque foreign body. IMPRESSION: Generalized soft tissue edema without soft tissue air or radiopaque foreign body. No acute osseous abnormality. Electronically Signed   By: Keith Rake M.D.   On: 07/26/2019 16:28   VAS [place] LOWER EXTREMITY VENOUS (DVT)  Result Date: 07/26/2019  Lower Venous DVTStudy Indications: Edema.  Limitations: Body habitus and poor ultrasound/tissue interface. Comparison Study: previous 11/06/13 Performing Technologist: Abram Sander RVS  Examination Guidelines: A complete evaluation includes B-mode imaging, spectral Doppler, color Doppler, and power Doppler as needed of all accessible portions of each vessel. Bilateral testing is considered an integral part of a complete examination. Limited examinations for reoccurring indications may be performed as noted. The reflux portion of the exam is performed with the patient in reverse Trendelenburg.  +---------+---------------+---------+-----------+----------+--------------+ RIGHT    CompressibilityPhasicitySpontaneityPropertiesThrombus Aging +---------+---------------+---------+-----------+----------+--------------+ CFV      Full           Yes      Yes                                 +---------+---------------+---------+-----------+----------+--------------+ SFJ      Full                                                        +---------+---------------+---------+-----------+----------+--------------+ FV Prox  Full                                                        +---------+---------------+---------+-----------+----------+--------------+ FV Mid                  Yes      Yes                                 +---------+---------------+---------+-----------+----------+--------------+ FV Distal                Yes      Yes                                 +---------+---------------+---------+-----------+----------+--------------+ PFV      Full                                                        +---------+---------------+---------+-----------+----------+--------------+ POP      Full  Yes      Yes                                 +---------+---------------+---------+-----------+----------+--------------+ PTV      Full                                                        +---------+---------------+---------+-----------+----------+--------------+ PERO                                                  Not visualized +---------+---------------+---------+-----------+----------+--------------+   +---------+---------------+---------+-----------+----------+--------------+ LEFT     CompressibilityPhasicitySpontaneityPropertiesThrombus Aging +---------+---------------+---------+-----------+----------+--------------+ CFV      Full           Yes      Yes                                 +---------+---------------+---------+-----------+----------+--------------+ SFJ      Full                                                        +---------+---------------+---------+-----------+----------+--------------+ FV Prox  Full                                                        +---------+---------------+---------+-----------+----------+--------------+ FV Mid                                                Not visualized +---------+---------------+---------+-----------+----------+--------------+ FV Distal                                             Not visualized +---------+---------------+---------+-----------+----------+--------------+ PFV      Full                                                        +---------+---------------+---------+-----------+----------+--------------+ POP      Full           Yes      Yes                                  +---------+---------------+---------+-----------+----------+--------------+ PTV  Not visualized +---------+---------------+---------+-----------+----------+--------------+ PERO                                                  Not visualized +---------+---------------+---------+-----------+----------+--------------+     Summary: RIGHT: - There is no evidence of deep vein thrombosis in the lower extremity. However, portions of this examination were limited- see technologist comments above.  - No cystic structure found in the popliteal fossa.  LEFT: - There is no evidence of deep vein thrombosis in the lower extremity. However, portions of this examination were limited- see technologist comments above.  - No cystic structure found in the popliteal fossa.  *See table(s) above for measurements and observations.    Preliminary      Medical Consultants:    None.  Anti-Infectives:   IV vancomycin and Rocephin  Subjective:    [name] she relates her lower extremity continues to hurt.  Objective:    Vitals:   07/26/19 1954 07/26/19 2252 07/27/19 0343 07/27/19 0345  BP: 120/87  108/67   Pulse: 75 87 89   Resp: (!) 27 20 (!) 31 (!) 24  Temp: 98.9 F (37.2 C)  98.7 F (37.1 C)   TempSrc: Oral  Oral   SpO2: 96% 96% 92%   Weight:   (!) 164 kg   Height:       SpO2: 92 %   Intake/Output Summary (Last 24 hours) at 07/27/2019 0847 Last data filed at 07/27/2019 0340 Gross per 24 hour  Intake 750 ml  Output 4250 ml  Net -3500 ml   Filed Weights   07/26/19 1230 07/27/19 0343  Weight: (!) 154.2 kg (!) 164 kg    Exam: General exam: In no acute distress. Respiratory system: Good air movement and clear to auscultation. Cardiovascular system: S1 & S2 heard, RRR.  Gastrointestinal system: Abdomen is nondistended, soft and nontender.  Extremities: No pedal edema. Skin: Bilateral lower extremity wounds warm and  tender to touch with purulent drainage Psychiatry: Judgement and insight appear normal. Mood & affect appropriate.    Data Reviewed:    Labs: Basic Metabolic Panel: Recent Labs  Lab 07/26/19 1219 07/26/19 1219 07/26/19 1248 07/26/19 1248 07/26/19 2013 07/27/19 0302  NA 122*  --  122*  --  126* 129*  K 4.9   < > 4.7   < > 4.9 4.0  CL 90*  --   --   --  92* 94*  CO2 23  --   --   --  24 23  GLUCOSE 111*  --   --   --  96 111*  BUN 10  --   --   --  9 9  CREATININE 0.61  --   --   --  0.59 0.61  CALCIUM 8.1*  --   --   --  8.1* 8.1*   < > = values in this interval not displayed.   GFR Estimated Creatinine Clearance: 128.5 mL/min (by C-G formula based on SCr of 0.61 mg/dL). Liver Function Tests: Recent Labs  Lab 07/26/19 1219  AST 22  ALT 15  ALKPHOS 113  BILITOT 0.4  PROT 6.4*  ALBUMIN 2.8*   No results for input(s): LIPASE, AMYLASE in the last 168 hours. No results for input(s): AMMONIA in the last 168 hours. Coagulation profile Recent Labs  Lab 07/26/19 1219  INR 1.2  COVID-19 Labs  No results for input(s): DDIMER, FERRITIN, LDH, CRP in the last 72 hours.  Lab Results  Component Value Date   [place] NEGATIVE 07/26/2019    CBC: Recent Labs  Lab 07/26/19 1219 07/26/19 1248 07/27/19 0302  WBC 10.8*  --  6.8  NEUTROABS 8.9*  --   --   HGB 10.5* 11.6* 10.7*  HCT 33.3* 34.0* 33.6*  MCV 86.3  --  84.0  PLT 294  --  284   Cardiac Enzymes: No results for input(s): CKTOTAL, CKMB, CKMBINDEX, TROPONINI in the last 168 hours. BNP (last 3 results) No results for input(s): PROBNP in the last 8760 hours. CBG: No results for input(s): GLUCAP in the last 168 hours. D-Dimer: No results for input(s): DDIMER in the last 72 hours. Hgb A1c: No results for input(s): HGBA1C in the last 72 hours. Lipid Profile: Recent Labs    07/26/19 1628  CHOL 107  HDL 39*  LDLCALC 59  TRIG 47  CHOLHDL 2.7   Thyroid function studies: Recent Labs     07/26/19 1628  TSH 1.094   Anemia work up: No results for input(s): VITAMINB12, FOLATE, FERRITIN, TIBC, IRON, RETICCTPCT in the last 72 hours. Sepsis Labs: Recent Labs  Lab 07/26/19 1219 07/26/19 1235 07/26/19 1628 07/27/19 0302  WBC 10.8*  --   --  6.8  LATICACIDVEN  --  0.9 0.6  --    Microbiology Recent Results (from the past 240 hour(s))  SARS CORONAVIRUS 2 (TAT 6-24 HRS) Nasopharyngeal Nasopharyngeal Swab     Status: None   Collection Time: 07/26/19  2:03 PM   Specimen: Nasopharyngeal Swab  Result Value Ref Range Status   SARS Coronavirus 2 NEGATIVE NEGATIVE Final    Comment: (NOTE) SARS-CoV-2 target nucleic acids are NOT DETECTED. The SARS-CoV-2 RNA is generally detectable in upper and lower respiratory specimens during the acute phase of infection. Negative results do not preclude SARS-CoV-2 infection, do not rule out co-infections with other pathogens, and should not be used as the sole basis for treatment or other patient management decisions. Negative results must be combined with clinical observations, patient history, and epidemiological information. The expected result is Negative. Fact Sheet for Patients: SugarRoll.be Fact Sheet for Healthcare Providers: https://www.woods-mathews.com/ This test is not yet approved or cleared by the [place] FDA and  has been authorized for detection and/or diagnosis of SARS-CoV-2 by FDA under an Emergency Use Authorization (EUA). This EUA will remain  in effect (meaning this test can be used) for the duration of the COVID-19 declaration under Section 56 4(b)(1) of the Act, 21 U.S.C. section 360bbb-3(b)(1), unless the authorization is terminated or revoked sooner. Performed at [facility], [place] [address], [place], [place] [address]   MRSA PCR Screening     Status: Abnormal   Collection Time: 07/26/19  6:40 PM   Specimen: Nasopharyngeal  Result Value Ref Range Status    MRSA by PCR POSITIVE (A) NEGATIVE Final    Comment:        The GeneXpert MRSA Assay (FDA approved for NASAL specimens only), is one component of a comprehensive MRSA colonization surveillance program. It is not intended to diagnose MRSA infection nor to guide or monitor treatment for MRSA infections. RESULT CALLED TO, READ BACK BY AND VERIFIED WITH: Sherri Rad RN 07/26/19 2029 JDW Performed at [place] [address] [place],  [address]      Medications:   . buPROPion  150 mg Oral Daily  .  busPIRone  15 mg Oral BID  . calcium citrate  1 tablet Oral Daily  . Chlorhexidine Gluconate Cloth  6 each Topical Q0600  . clobetasol cream  1 application Topical BID  . clonazePAM  1 mg Oral QHS  . DULoxetine  60 mg Oral Daily  . enoxaparin (LOVENOX) injection  75 mg Subcutaneous Q24H  . ferrous sulfate  325 mg Oral Q breakfast  . furosemide  80 mg Oral Daily  . gabapentin  1,200 mg Oral TID  . mupirocin ointment  1 application Nasal BID  . nebivolol  10 mg Oral Daily  . nystatin  1 application Topical QID  . Oxcarbazepine  300 mg Oral BID  . pantoprazole  40 mg Oral BID  . pentoxifylline  400 mg Oral TID WC  . rosuvastatin  20 mg Oral QPM  . sodium chloride  1 g Oral BID WC   Continuous Infusions: . vancomycin 750 mg (07/27/19 0601)      LOS: 1 day   Charlynne Cousins  [facility]  07/27/2019, 8:47 AM

## 2019-07-27 NOTE — Plan of Care (Signed)
  Problem: Education: Goal: Knowledge of General Education information will improve Description: Including pain rating scale, medication(s)/side effects and non-pharmacologic comfort measures Outcome: Progressing   Problem: Health Behavior/Discharge Planning: Goal: Ability to manage health-related needs will improve Outcome: Progressing   Problem: Clinical Measurements: Goal: Ability to maintain clinical measurements within normal limits will improve Outcome: Progressing Goal: Respiratory complications will improve Outcome: Progressing Goal: Cardiovascular complication will be avoided Outcome: Progressing   Problem: Nutrition: Goal: Adequate nutrition will be maintained Outcome: Progressing   Problem: Coping: Goal: Level of anxiety will decrease Outcome: Progressing   Problem: Elimination: Goal: Will not experience complications related to bowel motility Outcome: Progressing Goal: Will not experience complications related to urinary retention Outcome: Progressing   Problem: Pain Managment: Goal: General experience of comfort will improve Outcome: Progressing   Problem: Safety: Goal: Ability to remain free from injury will improve Outcome: Progressing  Elesa Hacker, RN

## 2019-07-28 LAB — BASIC METABOLIC PANEL
Anion gap: 10 (ref 5–15)
BUN: 10 mg/dL (ref 6–20)
CO2: 28 mmol/L (ref 22–32)
Calcium: 8.5 mg/dL — ABNORMAL LOW (ref 8.9–10.3)
Chloride: 96 mmol/L — ABNORMAL LOW (ref 98–111)
Creatinine, Ser: 0.63 mg/dL (ref 0.44–1.00)
GFR calc Af Amer: >60 mL/min (ref >60)
GFR calc non Af Amer: >60 mL/min (ref >60)
Glucose, Bld: 83 mg/dL (ref 70–99)
Potassium: 4 mmol/L (ref 3.5–5.1)
Sodium: 134 mmol/L — ABNORMAL LOW (ref 135–145)

## 2019-07-28 LAB — ANTISTREPTOLYSIN O TITER: ASO: 224 IU/mL — ABNORMAL HIGH (ref 0.0–200.0)

## 2019-07-28 MED ORDER — FUROSEMIDE 10 MG/ML IJ SOLN
80.0000 mg | Freq: Two times a day (BID) | INTRAMUSCULAR | Status: DC
  Administered 2019-07-28 – 2019-08-01 (×9): 80 mg via INTRAVENOUS
  Filled 2019-07-28 (×9): qty 8

## 2019-07-28 MED ORDER — DIPHENHYDRAMINE HCL 25 MG PO CAPS
25.0000 mg | ORAL_CAPSULE | Freq: Four times a day (QID) | ORAL | Status: DC | PRN
  Administered 2019-07-28: 25 mg via ORAL
  Filled 2019-07-28: qty 1

## 2019-07-28 NOTE — Progress Notes (Signed)
[facility] PROGRESS NOTE    Progress Note  [name]  [id number] DOB: [birth year] DOA: 07/26/2019 PCP: [name], MD     Brief Narrative:   [name] is an 49 y.o. female medical history of polyarthritis nodosa depression left pain redness and swelling and fever that started a week prior to admission.  Assessment/Plan:   Sepsis due to bilateral lower extremity cellulitis She was started empirically on IV vancomycin and cefepime.  X-ray of the left and right foot showed no osteomyelitis, but a lot of edema, she appears significantly fluid overloaded on physical exam. I will de-escalate her antibiotic coverage to IV Vanco and Rocephin. Wound care recommended to change dressings twice a day and wrap with Ace bandages, continue to wrap with Ace bandages, try to keep the legs elevated above the heart level out of bed to chair.  Physical therapy evaluated the patient recommended home health PT.  Hypervolemic hyponatremia: Serum osmolarity and urine lites are pending.  This is likely not SIADH. She is definitely fluid overloaded on physical exam.  Her weight is up today I's and O's are poorly recorded, will increase Lasix to 80 mg twice a day.  Continue strict I's and O's and daily weights restrict her fluid.  Anxiety and depression: Continue current home medications.  Morbid obesity ([place]) Counceling.  Chronic diastolic heart failure: Continue Lasix, monitor electrolytes as needed. Strict I's and O's and daily weights restrict fluid. chec 2 -d echo  Essential hypertension: Blood pressure is significantly elevated especially her diastolic blood pressure. She is currently on labetalol and IV Lasix was started on Avapro. Check a basic metabolic panel tomorrow morning.  Polyarthritis nodosa: She will continue her therapy at [facility], noted.  Anxiety and depression Continue Wellbutrin BuSpar and Klonopin discontinue Valium.   Morbid obesity Noted  counseled.  Stage II bilateral lower extremity wounds present on admission RN Pressure Injury Documentation:    Estimated body mass index is 62.85 kg/m as calculated from the following:   Height as of this encounter: 5\' 2"  (1.575 m).   Weight as of this encounter: 155.9 kg. Malnutrition Type:      Malnutrition Characteristics:      Nutrition Interventions:       DVT prophylaxis: lovenox Family Communication:none Disposition Plan/Barrier to D/C: Once her infection is controlled, today she continues to have dead tissue.  Code Status:     Code Status Orders  (From admission, onward)         Start     Ordered   07/26/19 1523  Full code  Continuous     07/26/19 1524        Code Status History    Date Active Date Inactive Code Status Order ID Comments User Context   01/14/2016 0119 01/15/2016 1615 Full Code [id number]  Toy Baker, MD Inpatient   Advance Care Planning Activity        IV Access:    Peripheral IV   Procedures and diagnostic studies:   DG Chest Port 1 View  Result Date: 07/26/2019 CLINICAL DATA:  Fever. EXAM: PORTABLE CHEST 1 VIEW COMPARISON:  February 03, 2011. FINDINGS: The heart size and mediastinal contours are within normal limits. Both lungs are clear. The visualized skeletal structures are unremarkable. IMPRESSION: No active disease. Electronically Signed   By: Marijo Conception M.D.   On: 07/26/2019 12:55   DG Tibia/Fibula Left Port  Result Date: 07/26/2019 CLINICAL DATA:  Weeping cellulitis. EXAM: PORTABLE LEFT TIBIA  AND FIBULA - 2 VIEW COMPARISON:  None. FINDINGS: Cortical margins of the tibia and fibula are intact. No periosteal reaction or bony destruction. No fracture. Osteoarthritis of the knee. Generalized subcutaneous soft tissue edema throughout the lower extremity. No soft tissue air or radiopaque foreign body. IMPRESSION: Generalized soft tissue edema. No acute osseous abnormality. No soft tissue air or radiopaque  foreign body. Electronically Signed   By: Keith Rake M.D.   On: 07/26/2019 16:27   DG Tibia/Fibula Right Port  Result Date: 07/26/2019 CLINICAL DATA:  Weeping cellulitis. EXAM: PORTABLE RIGHT TIBIA AND FIBULA - 2 VIEW COMPARISON:  None. FINDINGS: Cortical margins of the tibia and fibula are intact. No periosteal reaction or bony destruction. No fracture. Osteoarthritis of the knee. The ankle is not included on the cross-table lateral view. Generalized subcutaneous soft tissue edema throughout the lower extremity. No soft tissue air or radiopaque foreign body. IMPRESSION: Generalized soft tissue edema without soft tissue air or radiopaque foreign body. No acute osseous abnormality. Electronically Signed   By: Keith Rake M.D.   On: 07/26/2019 16:28   ECHOCARDIOGRAM COMPLETE  Result Date: 07/27/2019    ECHOCARDIOGRAM REPORT   Patient Name:   [name] Date of Exam: 07/27/2019 Medical Rec #:  [id number]    Height:       62.0 in Accession #:    [id number]   Weight:       361.5 lb Date of Birth:  May 05, 1971    BSA:          2.459 m Patient Age:    9 years     BP:           108/67 mmHg Patient Gender: F            HR:           78 bpm. Exam Location:  Inpatient Procedure: 2D Echo, Cardiac Doppler and Color Doppler Indications:    R06.02 SOB  History:        Patient has prior history of Echocardiogram examinations, most                 recent 03/01/2011. Pulmonary HTN, Signs/Symptoms:Dyspnea; Risk                 Factors:Sleep Apnea and Dyslipidemia.  Sonographer:    Roseanna Rainbow RDCS Referring Phys: [phone] [facility] ORTIZ  Sonographer Comments: Technically difficult study due to poor echo windows, suboptimal subcostal window, suboptimal apical window, suboptimal parasternal window and patient is morbidly obese. Image acquisition challenging due to patient body habitus. IMPRESSIONS  1. Left ventricular ejection fraction, by estimation, is 60 to 65%. The left ventricle has normal function. The left  ventricle has no regional wall motion abnormalities. There is moderate left ventricular hypertrophy. Left ventricular diastolic parameters were normal.  2. Right ventricular systolic function is normal. The right ventricular size is normal. Tricuspid regurgitation signal is inadequate for assessing PA pressure.  3. The mitral valve is grossly normal. Trivial mitral valve regurgitation.  4. The aortic valve is tricuspid. Aortic valve regurgitation is not visualized.  5. The inferior vena cava is dilated in size with >50% respiratory variability, suggesting right atrial pressure of 8 mmHg. FINDINGS  Left Ventricle: Left ventricular ejection fraction, by estimation, is 60 to 65%. The left ventricle has normal function. The left ventricle has no regional wall motion abnormalities. The left ventricular internal cavity size was normal in size. There is  moderate left ventricular hypertrophy. Left ventricular diastolic  parameters were normal. Right Ventricle: The right ventricular size is normal. No increase in right ventricular wall thickness. Right ventricular systolic function is normal. Tricuspid regurgitation signal is inadequate for assessing PA pressure. Left Atrium: Left atrial size was normal in size. Right Atrium: Right atrial size was normal in size. Pericardium: There is no evidence of pericardial effusion. Mitral Valve: The mitral valve is grossly normal. Trivial mitral valve regurgitation. Tricuspid Valve: The tricuspid valve is grossly normal. Tricuspid valve regurgitation is trivial. Aortic Valve: The aortic valve is tricuspid. Aortic valve regurgitation is not visualized. Pulmonic Valve: The pulmonic valve was grossly normal. Pulmonic valve regurgitation is not visualized. Aorta: The aortic root, ascending aorta, aortic arch and descending aorta are all structurally normal, with no evidence of dilitation or obstruction. Venous: The inferior vena cava is dilated in size with greater than 50% respiratory  variability, suggesting right atrial pressure of 8 mmHg. IAS/Shunts: No atrial level shunt detected by color flow Doppler.  LEFT VENTRICLE PLAX 2D LVIDd:         4.30 cm      Diastology LVIDs:         3.00 cm      LV e' lateral:   11.20 cm/s LV PW:         1.30 cm      LV E/e' lateral: 8.6 LV IVS:        1.30 cm      LV e' medial:    10.40 cm/s LVOT diam:     2.00 cm      LV E/e' medial:  9.3 LV SV:         119 LV SV Index:   49 LVOT Area:     3.14 cm  LV Volumes (MOD) LV vol d, MOD A2C: 121.0 ml LV vol d, MOD A4C: 105.0 ml LV vol s, MOD A2C: 46.5 ml LV vol s, MOD A4C: 37.1 ml LV SV MOD A2C:     74.5 ml LV SV MOD A4C:     105.0 ml LV SV MOD BP:      70.3 ml RIGHT VENTRICLE             IVC RV S prime:     12.60 cm/s  IVC diam: 2.60 cm TAPSE (M-mode): 2.3 cm LEFT ATRIUM             Index       RIGHT ATRIUM           Index LA diam:        3.70 cm 1.50 cm/m  RA Area:     18.80 cm LA Vol (A2C):   64.6 ml 26.28 ml/m RA Volume:   54.40 ml  22.13 ml/m LA Vol (A4C):   58.0 ml 23.59 ml/m LA Biplane Vol: 65.3 ml 26.56 ml/m  AORTIC VALVE LVOT Vmax:   174.00 cm/s LVOT Vmean:  121.000 cm/s LVOT VTI:    0.380 m  AORTA Ao Root diam: 3.40 cm Ao Asc diam:  3.10 cm MITRAL VALVE MV Area (PHT): 4.31 cm    SHUNTS MV Decel Time: 176 msec    Systemic VTI:  0.38 m MV E velocity: 96.80 cm/s  Systemic Diam: 2.00 cm MV A velocity: 59.10 cm/s MV E/A ratio:  1.64 Lyman Bishop MD Electronically signed by Lyman Bishop MD Signature Date/Time: 07/27/2019/12:41:55 PM    Final    VAS [place] LOWER EXTREMITY VENOUS (DVT)  Result Date: 07/27/2019  Lower Venous DVTStudy Indications: Edema.  Limitations: Body habitus and poor ultrasound/tissue interface. Comparison Study: previous 11/06/13 Performing Technologist: Abram Sander RVS  Examination Guidelines: A complete evaluation includes B-mode imaging, spectral Doppler, color Doppler, and power Doppler as needed of all accessible portions of each vessel. Bilateral testing is considered an integral  part of a complete examination. Limited examinations for reoccurring indications may be performed as noted. The reflux portion of the exam is performed with the patient in reverse Trendelenburg.  +---------+---------------+---------+-----------+----------+--------------+ RIGHT    CompressibilityPhasicitySpontaneityPropertiesThrombus Aging +---------+---------------+---------+-----------+----------+--------------+ CFV      Full           Yes      Yes                                 +---------+---------------+---------+-----------+----------+--------------+ SFJ      Full                                                        +---------+---------------+---------+-----------+----------+--------------+ FV Prox  Full                                                        +---------+---------------+---------+-----------+----------+--------------+ FV Mid                  Yes      Yes                                 +---------+---------------+---------+-----------+----------+--------------+ FV Distal               Yes      Yes                                 +---------+---------------+---------+-----------+----------+--------------+ PFV      Full                                                        +---------+---------------+---------+-----------+----------+--------------+ POP      Full           Yes      Yes                                 +---------+---------------+---------+-----------+----------+--------------+ PTV      Full                                                        +---------+---------------+---------+-----------+----------+--------------+ PERO  Not visualized +---------+---------------+---------+-----------+----------+--------------+   +---------+---------------+---------+-----------+----------+--------------+ LEFT     CompressibilityPhasicitySpontaneityPropertiesThrombus Aging  +---------+---------------+---------+-----------+----------+--------------+ CFV      Full           Yes      Yes                                 +---------+---------------+---------+-----------+----------+--------------+ SFJ      Full                                                        +---------+---------------+---------+-----------+----------+--------------+ FV Prox  Full                                                        +---------+---------------+---------+-----------+----------+--------------+ FV Mid                                                Not visualized +---------+---------------+---------+-----------+----------+--------------+ FV Distal                                             Not visualized +---------+---------------+---------+-----------+----------+--------------+ PFV      Full                                                        +---------+---------------+---------+-----------+----------+--------------+ POP      Full           Yes      Yes                                 +---------+---------------+---------+-----------+----------+--------------+ PTV                                                   Not visualized +---------+---------------+---------+-----------+----------+--------------+ PERO                                                  Not visualized +---------+---------------+---------+-----------+----------+--------------+     Summary: RIGHT: - There is no evidence of deep vein thrombosis in the lower extremity. However, portions of this examination were limited- see technologist comments above.  - No cystic structure found in the popliteal fossa.  LEFT: - There is no evidence of deep vein thrombosis in the lower extremity. However, portions of this examination were limited- see technologist comments above.  - No cystic structure found in the popliteal fossa.  *  See table(s) above for measurements and observations.  Electronically signed by Harold Barban MD on 07/27/2019 at 7:07:21 PM.    Final      Medical Consultants:    None.  Anti-Infectives:   IV vancomycin and Rocephin  Subjective:    [name] relates her lower extremity pain is improved.  Objective:    Vitals:   07/27/19 1640 07/27/19 1949 07/28/19 0412 07/28/19 0905  BP:  115/66 117/69 118/69  Pulse:  72 69 66  Resp:  18 (!) 23 20  Temp: 99.2 F (37.3 C) 98.3 F (36.8 C) 97.8 F (36.6 C)   TempSrc: Oral Oral Oral   SpO2:  96% 97% 95%  Weight:   (!) 155.9 kg   Height:       SpO2: 95 % O2 Flow Rate (L/min): 2 L/min   Intake/Output Summary (Last 24 hours) at 07/28/2019 0909 Last data filed at 07/28/2019 0600 Gross per 24 hour  Intake 1600 ml  Output 3500 ml  Net -1900 ml   Filed Weights   07/26/19 1230 07/27/19 0343 07/28/19 0412  Weight: (!) 154.2 kg (!) 164 kg (!) 155.9 kg    Exam: General exam: In no acute distress. Respiratory system: Good air movement and clear to auscultation. Cardiovascular system: S1 & S2 heard, RRR. No JVD. Gastrointestinal system: Abdomen is nondistended, soft and nontender.  Central nervous system: Alert and oriented. No focal neurological deficits. Extremities: 3+ edema Skin: Bilateral lower extremity wounds they are warm to touch not tender anymore still with purulent dried drainage.  With some mild granulation tissue. Psychiatry: Judgement and insight appear normal. Mood & affect appropriate.   Data Reviewed:    Labs: Basic Metabolic Panel: Recent Labs  Lab 07/26/19 1219 07/26/19 1219 07/26/19 1248 07/26/19 1248 07/26/19 2013 07/26/19 2013 07/27/19 0302 07/28/19 0411  NA 122*  --  122*  --  126*  --  129* 134*  K 4.9   < > 4.7   < > 4.9   < > 4.0 4.0  CL 90*  --   --   --  92*  --  94* 96*  CO2 23  --   --   --  24  --  23 28  GLUCOSE 111*  --   --   --  96  --  111* 83  BUN 10  --   --   --  9  --  9 10  CREATININE 0.61  --   --   --  0.59  --  0.61 0.63    CALCIUM 8.1*  --   --   --  8.1*  --  8.1* 8.5*   < > = values in this interval not displayed.   GFR Estimated Creatinine Clearance: 124.1 mL/min (by C-G formula based on SCr of 0.63 mg/dL). Liver Function Tests: Recent Labs  Lab 07/26/19 1219  AST 22  ALT 15  ALKPHOS 113  BILITOT 0.4  PROT 6.4*  ALBUMIN 2.8*   No results for input(s): LIPASE, AMYLASE in the last 168 hours. No results for input(s): AMMONIA in the last 168 hours. Coagulation profile Recent Labs  Lab 07/26/19 1219  INR 1.2   COVID-19 Labs  No results for input(s): DDIMER, FERRITIN, LDH, CRP in the last 72 hours.  Lab Results  Component Value Date   [place] NEGATIVE 07/26/2019    CBC: Recent Labs  Lab 07/26/19 1219 07/26/19 1248 07/27/19 0302  WBC 10.8*  --  6.8  NEUTROABS 8.9*  --   --   HGB 10.5* 11.6* 10.7*  HCT 33.3* 34.0* 33.6*  MCV 86.3  --  84.0  PLT 294  --  284   Cardiac Enzymes: No results for input(s): CKTOTAL, CKMB, CKMBINDEX, TROPONINI in the last 168 hours. BNP (last 3 results) No results for input(s): PROBNP in the last 8760 hours. CBG: No results for input(s): GLUCAP in the last 168 hours. D-Dimer: No results for input(s): DDIMER in the last 72 hours. Hgb A1c: Recent Labs    07/27/19 0401  HGBA1C 5.2   Lipid Profile: Recent Labs    07/26/19 1628  CHOL 107  HDL 39*  LDLCALC 59  TRIG 47  CHOLHDL 2.7   Thyroid function studies: Recent Labs    07/26/19 1628  TSH 1.094   Anemia work up: No results for input(s): VITAMINB12, FOLATE, FERRITIN, TIBC, IRON, RETICCTPCT in the last 72 hours. Sepsis Labs: Recent Labs  Lab 07/26/19 1219 07/26/19 1235 07/26/19 1628 07/27/19 0302  WBC 10.8*  --   --  6.8  LATICACIDVEN  --  0.9 0.6  --    Microbiology Recent Results (from the past 240 hour(s))  SARS CORONAVIRUS 2 (TAT 6-24 HRS) Nasopharyngeal Nasopharyngeal Swab     Status: None   Collection Time: 07/26/19  2:03 PM   Specimen: Nasopharyngeal Swab  Result  Value Ref Range Status   SARS Coronavirus 2 NEGATIVE NEGATIVE Final    Comment: (NOTE) SARS-CoV-2 target nucleic acids are NOT DETECTED. The SARS-CoV-2 RNA is generally detectable in upper and lower respiratory specimens during the acute phase of infection. Negative results do not preclude SARS-CoV-2 infection, do not rule out co-infections with other pathogens, and should not be used as the sole basis for treatment or other patient management decisions. Negative results must be combined with clinical observations, patient history, and epidemiological information. The expected result is Negative. Fact Sheet for Patients: SugarRoll.be Fact Sheet for Healthcare Providers: https://www.woods-mathews.com/ This test is not yet approved or cleared by the [place] FDA and  has been authorized for detection and/or diagnosis of SARS-CoV-2 by FDA under an Emergency Use Authorization (EUA). This EUA will remain  in effect (meaning this test can be used) for the duration of the COVID-19 declaration under Section 56 4(b)(1) of the Act, 21 U.S.C. section 360bbb-3(b)(1), unless the authorization is terminated or revoked sooner. Performed at [facility], [place] [address], [place], [place] [address]   Urine culture     Status: None   Collection Time: 07/26/19  2:58 PM   Specimen: In/Out Cath Urine  Result Value Ref Range Status   Specimen Description IN/OUT CATH URINE  Final   Special Requests NONE  Final   Culture   Final    NO GROWTH Performed at [facility], [place] [address] [place], [place] [address]    Report Status 07/27/2019 FINAL  Final  MRSA PCR Screening     Status: Abnormal   Collection Time: 07/26/19  6:40 PM   Specimen: Nasopharyngeal  Result Value Ref Range Status   MRSA by PCR POSITIVE (A) NEGATIVE Final    Comment:        The GeneXpert MRSA Assay (FDA approved for NASAL specimens only), is one component of  a comprehensive MRSA colonization surveillance program. It is not intended to diagnose MRSA infection nor to guide or monitor treatment for MRSA infections. RESULT CALLED TO, READ BACK BY AND VERIFIED WITH: Sherri Rad RN 07/26/19 2029 JDW  Performed at [facility], [place] [address], [place], [place] [address]      Medications:   . buPROPion  150 mg Oral Daily  . busPIRone  15 mg Oral BID  . clonazePAM  1 mg Oral QHS  . DULoxetine  60 mg Oral Daily  . enoxaparin (LOVENOX) injection  80 mg Subcutaneous Q24H  . furosemide  60 mg Intravenous BID  . gabapentin  1,200 mg Oral TID  . irbesartan  75 mg Oral Daily  . mupirocin ointment  1 application Nasal BID  . nebivolol  10 mg Oral Daily  . nystatin  1 application Topical QID  . Oxcarbazepine  300 mg Oral BID  . pantoprazole  40 mg Oral BID  . pentoxifylline  400 mg Oral TID WC  . rosuvastatin  20 mg Oral QPM  . sodium chloride  1 g Oral BID WC   Continuous Infusions: . cefTRIAXone (ROCEPHIN)  IV Stopped (07/27/19 1146)  . vancomycin 750 mg (07/28/19 0501)      LOS: 2 days   Charlynne Cousins  [facility]  07/28/2019, 9:09 AM

## 2019-07-29 ENCOUNTER — Other Ambulatory Visit: Payer: Self-pay | Admitting: Family Medicine

## 2019-07-29 LAB — BASIC METABOLIC PANEL
Anion gap: 14 (ref 5–15)
BUN: 9 mg/dL (ref 6–20)
CO2: 32 mmol/L (ref 22–32)
Calcium: 9 mg/dL (ref 8.9–10.3)
Chloride: 91 mmol/L — ABNORMAL LOW (ref 98–111)
Creatinine, Ser: 0.78 mg/dL (ref 0.44–1.00)
GFR calc Af Amer: >60 mL/min (ref >60)
GFR calc non Af Amer: >60 mL/min (ref >60)
Glucose, Bld: 90 mg/dL (ref 70–99)
Potassium: 3.7 mmol/L (ref 3.5–5.1)
Sodium: 137 mmol/L (ref 135–145)

## 2019-07-29 MED ORDER — CLOBETASOL PROPIONATE 0.05 % EX CREA
1.0000 "application " | TOPICAL_CREAM | Freq: Two times a day (BID) | CUTANEOUS | Status: DC
  Administered 2019-07-29 – 2019-08-02 (×5): 1 via TOPICAL
  Filled 2019-07-29 (×2): qty 15

## 2019-07-29 MED ORDER — COLLAGENASE 250 UNIT/GM EX OINT
TOPICAL_OINTMENT | Freq: Every day | CUTANEOUS | Status: DC
  Filled 2019-07-29 (×3): qty 30

## 2019-07-29 MED ORDER — HALOBETASOL PROPIONATE 0.05 % TOPICAL OINTMENT
Freq: Two times a day (BID) | TOPICAL | 5 refills | 0.00000 days | Status: CP
Start: 2019-07-29 — End: ?

## 2019-07-29 NOTE — Unmapped (Signed)
Pt is requesting refill of halobetasol 0.05% ointment. Pt's last visit was 03/13/2019.

## 2019-07-29 NOTE — Progress Notes (Signed)
[facility] PROGRESS NOTE    Progress Note  [name]  [id number] DOB: [birth year] DOA: 07/26/2019 PCP: [name], MD     Brief Narrative:   [name] is an 49 y.o. female medical history of polyarthritis nodosa depression left pain redness and swelling and fever that started a week prior to admission.  Assessment/Plan:   Sepsis due to bilateral lower extremity cellulitis She was started empirically on IV vancomycin and cefepime.  X-ray of the left and right foot showed no osteomyelitis, but a lot of edema, she appears significantly fluid overloaded on physical exam. Antibiotics have been de-escalate  to IV Vanco and Rocephin.  Legs are no longer purulent. We will reconsult wound care to evaluate wounds.  To see if we get a some kind of chemical debridement. Physical therapy has evaluated the patient recommended home health PT.  Hypervolemic hyponatremia: She was started on IV Lasix and her sodium has improved is now greater than 135. Continue strict I's and O's and daily weight. She appears fluid overloaded on physical exam continue IV Lasix.  Anxiety and depression: Continue current home medications.  Morbid obesity ([place]) Counceling.  Chronic diastolic heart failure: Monitor electrolytes and replete as needed. Strict I's and O's and daily weights restrict fluid. 2D echo done on 07/27/2019 showed: EF 60 to 65%. The  left ventricle has normal function. The left ventricle has no regional  wall motion abnormalities. There is moderate left ventricular hypertrophy.  Continue IV diuretics continues to diurese nicely she is about 8 L negative.  Continue to restrict fluids  Essential hypertension: Blood pressure is significantly elevated especially her diastolic blood pressure. She is currently on nevibolol and IV Lasix was started on Avapro on 07/28/2019 her blood pressure is tolerating it. Check a basic metabolic panel tomorrow morning.  Polyarthritis  nodosa: She will continue her therapy at [facility], noted.  Anxiety and depression Continue Wellbutrin BuSpar and Klonopin discontinue Valium.  Normocytic anemia:  No signs of overt bleeding follow-up with PCP as an outpatient she will need a colonoscopy she has never had a colonoscopy.  Morbid obesity Noted counseled.  Stage II bilateral lower extremity wounds present on admission RN Pressure Injury Documentation:    Estimated body mass index is 61.41 kg/m as calculated from the following:   Height as of this encounter: 5\' 2"  (1.575 m).   Weight as of this encounter: 152.3 kg.  DVT prophylaxis: lovenox Family Communication:none Disposition Plan/Barrier to D/C: Once her infection is controlled, today she continues to have dead tissue.  Code Status:     Code Status Orders  (From admission, onward)         Start     Ordered   07/26/19 1523  Full code  Continuous     07/26/19 1524        Code Status History    Date Active Date Inactive Code Status Order ID Comments User Context   01/14/2016 0119 01/15/2016 1615 Full Code [id number]  Toy Baker, MD Inpatient   Advance Care Planning Activity        IV Access:    Peripheral IV   Procedures and diagnostic studies:   ECHOCARDIOGRAM COMPLETE  Result Date: 07/27/2019    ECHOCARDIOGRAM REPORT   Patient Name:   [name] Date of Exam: 07/27/2019 Medical Rec #:  [id number]    Height:       62.0 in Accession #:    [id number]   Weight:  361.5 lb Date of Birth:  Dec 01, 1970    BSA:          2.459 m Patient Age:    68 years     BP:           108/67 mmHg Patient Gender: F            HR:           78 bpm. Exam Location:  Inpatient Procedure: 2D Echo, Cardiac Doppler and Color Doppler Indications:    R06.02 SOB  History:        Patient has prior history of Echocardiogram examinations, most                 recent 03/01/2011. Pulmonary HTN, Signs/Symptoms:Dyspnea; Risk                 Factors:Sleep Apnea and  Dyslipidemia.  Sonographer:    Roseanna Rainbow RDCS Referring Phys: [phone] [facility] ORTIZ  Sonographer Comments: Technically difficult study due to poor echo windows, suboptimal subcostal window, suboptimal apical window, suboptimal parasternal window and patient is morbidly obese. Image acquisition challenging due to patient body habitus. IMPRESSIONS  1. Left ventricular ejection fraction, by estimation, is 60 to 65%. The left ventricle has normal function. The left ventricle has no regional wall motion abnormalities. There is moderate left ventricular hypertrophy. Left ventricular diastolic parameters were normal.  2. Right ventricular systolic function is normal. The right ventricular size is normal. Tricuspid regurgitation signal is inadequate for assessing PA pressure.  3. The mitral valve is grossly normal. Trivial mitral valve regurgitation.  4. The aortic valve is tricuspid. Aortic valve regurgitation is not visualized.  5. The inferior vena cava is dilated in size with >50% respiratory variability, suggesting right atrial pressure of 8 mmHg. FINDINGS  Left Ventricle: Left ventricular ejection fraction, by estimation, is 60 to 65%. The left ventricle has normal function. The left ventricle has no regional wall motion abnormalities. The left ventricular internal cavity size was normal in size. There is  moderate left ventricular hypertrophy. Left ventricular diastolic parameters were normal. Right Ventricle: The right ventricular size is normal. No increase in right ventricular wall thickness. Right ventricular systolic function is normal. Tricuspid regurgitation signal is inadequate for assessing PA pressure. Left Atrium: Left atrial size was normal in size. Right Atrium: Right atrial size was normal in size. Pericardium: There is no evidence of pericardial effusion. Mitral Valve: The mitral valve is grossly normal. Trivial mitral valve regurgitation. Tricuspid Valve: The tricuspid valve is grossly normal.  Tricuspid valve regurgitation is trivial. Aortic Valve: The aortic valve is tricuspid. Aortic valve regurgitation is not visualized. Pulmonic Valve: The pulmonic valve was grossly normal. Pulmonic valve regurgitation is not visualized. Aorta: The aortic root, ascending aorta, aortic arch and descending aorta are all structurally normal, with no evidence of dilitation or obstruction. Venous: The inferior vena cava is dilated in size with greater than 50% respiratory variability, suggesting right atrial pressure of 8 mmHg. IAS/Shunts: No atrial level shunt detected by color flow Doppler.  LEFT VENTRICLE PLAX 2D LVIDd:         4.30 cm      Diastology LVIDs:         3.00 cm      LV e' lateral:   11.20 cm/s LV PW:         1.30 cm      LV E/e' lateral: 8.6 LV IVS:        1.30 cm  LV e' medial:    10.40 cm/s LVOT diam:     2.00 cm      LV E/e' medial:  9.3 LV SV:         119 LV SV Index:   49 LVOT Area:     3.14 cm  LV Volumes (MOD) LV vol d, MOD A2C: 121.0 ml LV vol d, MOD A4C: 105.0 ml LV vol s, MOD A2C: 46.5 ml LV vol s, MOD A4C: 37.1 ml LV SV MOD A2C:     74.5 ml LV SV MOD A4C:     105.0 ml LV SV MOD BP:      70.3 ml RIGHT VENTRICLE             IVC RV S prime:     12.60 cm/s  IVC diam: 2.60 cm TAPSE (M-mode): 2.3 cm LEFT ATRIUM             Index       RIGHT ATRIUM           Index LA diam:        3.70 cm 1.50 cm/m  RA Area:     18.80 cm LA Vol (A2C):   64.6 ml 26.28 ml/m RA Volume:   54.40 ml  22.13 ml/m LA Vol (A4C):   58.0 ml 23.59 ml/m LA Biplane Vol: 65.3 ml 26.56 ml/m  AORTIC VALVE LVOT Vmax:   174.00 cm/s LVOT Vmean:  121.000 cm/s LVOT VTI:    0.380 m  AORTA Ao Root diam: 3.40 cm Ao Asc diam:  3.10 cm MITRAL VALVE MV Area (PHT): 4.31 cm    SHUNTS MV Decel Time: 176 msec    Systemic VTI:  0.38 m MV E velocity: 96.80 cm/s  Systemic Diam: 2.00 cm MV A velocity: 59.10 cm/s MV E/A ratio:  1.64 Lyman Bishop MD Electronically signed by Lyman Bishop MD Signature Date/Time: 07/27/2019/12:41:55 PM    Final       Medical Consultants:    None.  Anti-Infectives:   IV vancomycin and Rocephin  Subjective:    [name] she has no new complaints today.  Objective:    Vitals:   07/28/19 1844 07/28/19 1945 07/29/19 0355 07/29/19 0843  BP:  108/62 114/69 115/69  Pulse: 71 70 72 93  Resp: 15 15  17   Temp:  97.9 F (36.6 C) 97.8 F (36.6 C)   TempSrc:  Oral Oral   SpO2: 95% 95% 100% 92%  Weight:   (!) 152.3 kg   Height:       SpO2: 92 % O2 Flow Rate (L/min): 2 L/min   Intake/Output Summary (Last 24 hours) at 07/29/2019 1007 Last data filed at 07/29/2019 0843 Gross per 24 hour  Intake 666 ml  Output 3000 ml  Net -2334 ml   Filed Weights   07/27/19 0343 07/28/19 0412 07/29/19 0355  Weight: (!) 164 kg (!) 155.9 kg (!) 152.3 kg    Exam: General exam: In no acute distress, morbidly obese Respiratory system: Good air movement and clear to auscultation. Cardiovascular system: S1 & S2 heard, RRR. No JVD. Gastrointestinal system: Abdomen is nondistended, soft and nontender.  Extremities: Continues to have 3+ edema. Skin: Lower extremity wounds are warm and tender to touch but not purulent anymore. Psychiatry: Judgement and insight appear normal. Mood & affect appropriate.   Data Reviewed:    Labs: Basic Metabolic Panel: Recent Labs  Lab 07/26/19 1219 07/26/19 1219 07/26/19 1248 07/26/19 1248 07/26/19 2013 07/26/19 2013  07/27/19 0302 07/27/19 0302 07/28/19 0411 07/29/19 0409  NA 122*   < > 122*  --  126*  --  129*  --  134* 137  K 4.9   < > 4.7   < > 4.9   < > 4.0   < > 4.0 3.7  CL 90*  --   --   --  92*  --  94*  --  96* 91*  CO2 23  --   --   --  24  --  23  --  28 32  GLUCOSE 111*  --   --   --  96  --  111*  --  83 90  BUN 10  --   --   --  9  --  9  --  10 9  CREATININE 0.61  --   --   --  0.59  --  0.61  --  0.63 0.78  CALCIUM 8.1*  --   --   --  8.1*  --  8.1*  --  8.5* 9.0   < > = values in this interval not displayed.   GFR Estimated Creatinine  Clearance: 122.2 mL/min (by C-G formula based on SCr of 0.78 mg/dL). Liver Function Tests: Recent Labs  Lab 07/26/19 1219  AST 22  ALT 15  ALKPHOS 113  BILITOT 0.4  PROT 6.4*  ALBUMIN 2.8*   No results for input(s): LIPASE, AMYLASE in the last 168 hours. No results for input(s): AMMONIA in the last 168 hours. Coagulation profile Recent Labs  Lab 07/26/19 1219  INR 1.2   COVID-19 Labs  No results for input(s): DDIMER, FERRITIN, LDH, CRP in the last 72 hours.  Lab Results  Component Value Date   [place] NEGATIVE 07/26/2019    CBC: Recent Labs  Lab 07/26/19 1219 07/26/19 1248 07/27/19 0302  WBC 10.8*  --  6.8  NEUTROABS 8.9*  --   --   HGB 10.5* 11.6* 10.7*  HCT 33.3* 34.0* 33.6*  MCV 86.3  --  84.0  PLT 294  --  284   Cardiac Enzymes: No results for input(s): CKTOTAL, CKMB, CKMBINDEX, TROPONINI in the last 168 hours. BNP (last 3 results) No results for input(s): PROBNP in the last 8760 hours. CBG: No results for input(s): GLUCAP in the last 168 hours. D-Dimer: No results for input(s): DDIMER in the last 72 hours. Hgb A1c: Recent Labs    07/27/19 0401  HGBA1C 5.2   Lipid Profile: Recent Labs    07/26/19 1628  CHOL 107  HDL 39*  LDLCALC 59  TRIG 47  CHOLHDL 2.7   Thyroid function studies: Recent Labs    07/26/19 1628  TSH 1.094   Anemia work up: No results for input(s): VITAMINB12, FOLATE, FERRITIN, TIBC, IRON, RETICCTPCT in the last 72 hours. Sepsis Labs: Recent Labs  Lab 07/26/19 1219 07/26/19 1235 07/26/19 1628 07/27/19 0302  WBC 10.8*  --   --  6.8  LATICACIDVEN  --  0.9 0.6  --    Microbiology Recent Results (from the past 240 hour(s))  SARS CORONAVIRUS 2 (TAT 6-24 HRS) Nasopharyngeal Nasopharyngeal Swab     Status: None   Collection Time: 07/26/19  2:03 PM   Specimen: Nasopharyngeal Swab  Result Value Ref Range Status   SARS Coronavirus 2 NEGATIVE NEGATIVE Final    Comment: (NOTE) SARS-CoV-2 target nucleic acids are NOT  DETECTED. The SARS-CoV-2 RNA is generally detectable in upper and lower respiratory specimens during the acute  phase of infection. Negative results do not preclude SARS-CoV-2 infection, do not rule out co-infections with other pathogens, and should not be used as the sole basis for treatment or other patient management decisions. Negative results must be combined with clinical observations, patient history, and epidemiological information. The expected result is Negative. Fact Sheet for Patients: SugarRoll.be Fact Sheet for Healthcare Providers: https://www.woods-mathews.com/ This test is not yet approved or cleared by the [place] FDA and  has been authorized for detection and/or diagnosis of SARS-CoV-2 by FDA under an Emergency Use Authorization (EUA). This EUA will remain  in effect (meaning this test can be used) for the duration of the COVID-19 declaration under Section 56 4(b)(1) of the Act, 21 U.S.C. section 360bbb-3(b)(1), unless the authorization is terminated or revoked sooner. Performed at [facility], [place] [address] [place], [place] [address]   Urine culture     Status: None   Collection Time: 07/26/19  2:58 PM   Specimen: In/Out Cath Urine  Result Value Ref Range Status   Specimen Description IN/OUT CATH URINE  Final   Special Requests NONE  Final   Culture   Final    NO GROWTH Performed at [facility], [place] [address], [place], [place] [address]    Report Status 07/27/2019 FINAL  Final  MRSA PCR Screening     Status: Abnormal   Collection Time: 07/26/19  6:40 PM   Specimen: Nasopharyngeal  Result Value Ref Range Status   MRSA by PCR POSITIVE (A) NEGATIVE Final    Comment:        The GeneXpert MRSA Assay (FDA approved for NASAL specimens only), is one component of a comprehensive MRSA colonization surveillance program. It is not intended to diagnose MRSA infection nor to guide or monitor treatment  for MRSA infections. RESULT CALLED TO, READ BACK BY AND VERIFIED WITH: Sherri Rad RN 07/26/19 2029 JDW Performed at [place] [address], [place], [place] [address]      Medications:   . buPROPion  150 mg Oral Daily  . busPIRone  15 mg Oral BID  . clonazePAM  1 mg Oral QHS  . DULoxetine  60 mg Oral Daily  . enoxaparin (LOVENOX) injection  80 mg Subcutaneous Q24H  . furosemide  80 mg Intravenous BID  . gabapentin  1,200 mg Oral TID  . irbesartan  75 mg Oral Daily  . mupirocin ointment  1 application Nasal BID  . nebivolol  10 mg Oral Daily  . nystatin  1 application Topical QID  . Oxcarbazepine  300 mg Oral BID  . pantoprazole  40 mg Oral BID  . pentoxifylline  400 mg Oral TID WC  . rosuvastatin  20 mg Oral QPM  . sodium chloride  1 g Oral BID WC   Continuous Infusions: . cefTRIAXone (ROCEPHIN)  IV 2 g (07/29/19 0921)  . vancomycin 750 mg (07/29/19 0340)      LOS: 3 days   Charlynne Cousins  [facility]  07/29/2019, 10:07 AM

## 2019-07-29 NOTE — Progress Notes (Signed)
Physical Therapy Treatment Patient Details Name: [name] MRN: [id number] DOB: 05-24-1970 Today's Date: 07/29/2019    History of Present Illness 49 y.o. female medical history of polyarthritis nodosa depression left pain redness and swelling and fever that started a week prior to admission. Presented to hospital with leg pain, rash and fever. Pt dx with sepsis sec to BLE cellulitis    PT Comments    Patient received in bed, this is my 3rd attempt to see her today, waited for her to get/ finish lunch, then patient states she is in so much pain she would rather not walk. She agrees to LE bed exercises.  Patient requires min guard for heel slides in supine. She reports 12/10 pain in B LEs. Legs had just been re-dressed. Patient will continue to benefit from skilled PT while here to improve functional independence and strength for safe return home. Encouraged patient to walk with NT later if pain level allows.    Follow Up Recommendations  Home health PT     Equipment Recommendations  None recommended by PT    Recommendations for Other Services       Precautions / Restrictions Precautions Precautions: Fall Restrictions Weight Bearing Restrictions: No    Mobility  Bed Mobility               General bed mobility comments: patient declined due to pain in B LEs  Transfers                 General transfer comment: patient declining due to pain  Ambulation/Gait             General Gait Details: patient declined due to pain   Stairs             Wheelchair Mobility    Modified Rankin (Stroke Patients Only)       Balance                                            Cognition Arousal/Alertness: Awake/alert Behavior During Therapy: WFL for tasks assessed/performed Overall Cognitive Status: No family/caregiver present to determine baseline cognitive functioning                                        Exercises  Other Exercises Other Exercises: B LEs ap, heel slides, hip abd, SLR, x 10 reps each. Required assist with heel slides.    General Comments        Pertinent Vitals/Pain Pain Assessment: 0-10 Pain Score: 10-Worst pain ever Pain Location: B legs w/ no activity Pain Descriptors / Indicators: Burning;Aching Pain Intervention(s): Monitored during session;Repositioned    Home Living                      Prior Function            PT Goals (current goals can now be found in the care plan section) Acute Rehab PT Goals Patient Stated Goal: to get better, decrease pain PT Goal Formulation: With patient Time For Goal Achievement: 08/10/19 Potential to Achieve Goals: Good Progress towards PT goals: Progressing toward goals    Frequency    Min 3X/week      PT Plan Current plan remains appropriate    Co-evaluation  AM-PAC PT "6 Clicks" Mobility   Outcome Measure  Help needed turning from your back to your side while in a flat bed without using bedrails?: A Little Help needed moving from lying on your back to sitting on the side of a flat bed without using bedrails?: A Little Help needed moving to and from a bed to a chair (including a wheelchair)?: A Little Help needed standing up from a chair using your arms (e.g., wheelchair or bedside chair)?: A Little Help needed to walk in hospital room?: A Little Help needed climbing 3-5 steps with a railing? : A Lot 6 Click Score: 17    End of Session   Activity Tolerance: Patient limited by pain Patient left: in bed;with call bell/phone within reach Nurse Communication: Mobility status PT Visit Diagnosis: Other abnormalities of gait and mobility (R26.89);Pain Pain - Right/Left: Left(bilateral) Pain - part of body: Leg     Time: 1415-1430 PT Time Calculation (min) (ACUTE ONLY): 15 min  Charges:  $Therapeutic Exercise: 8-22 mins                     Rhylynn Perdomo, PT, GCS 07/29/19,2:40 PM

## 2019-07-29 NOTE — Progress Notes (Signed)
Pharmacy Antibiotic Note  [name] is a 49 y.o. female admitted on 07/26/2019 with sepsis.  Pharmacy has been consulted for vancomycin dosing, also on ceftriaxone. Cr remains stable, cellulitis improving - will defer levels today and re-evaluate tomorrow depending on plans.  Plan: -Continue vancomycin 750mg  IV q8h -Ceftriaxone 2g IV q24h -Watch renal function closely -Can likely stop ABX after 5 days (after tomorrow)   Height: 5\' 2"  (157.5 cm) Weight: (!) 335 lb 12.2 oz (152.3 kg) IBW/kg (Calculated) : 50.1  Temp (24hrs), Avg:97.9 F (36.6 C), Min:97.8 F (36.6 C), Max:98.1 F (36.7 C)  Recent Labs  Lab 07/26/19 1219 07/26/19 1235 07/26/19 1628 07/26/19 2013 07/27/19 0302 07/28/19 0411 07/29/19 0409  WBC 10.8*  --   --   --  6.8  --   --   CREATININE 0.61  --   --  0.59 0.61 0.63 0.78  LATICACIDVEN  --  0.9 0.6  --   --   --   --     Estimated Creatinine Clearance: 122.2 mL/min (by C-G formula based on SCr of 0.78 mg/dL).    Allergies  Allergen Reactions  . Niacin Anaphylaxis, Swelling and Other (See Comments)    REACTION: Swelling, problems breathing  . Sulfamethoxazole-Trimethoprim Anaphylaxis, Swelling, Rash and Other (See Comments)    REACTION: Rash, throat closed, eyes swelled.  . Aspirin Hives  . Bactrim Hives  . Benzoin Compound Rash  . Cephalexin Hives  . Clindamycin/Lincomycin Hives  . Doxycycline Hives  . Iohexol Hives    Pt treated with PO benedryl  . Lisinopril Hives  . Naproxen Hives  . Pnu-Imune [Pneumococcal Polysaccharide Vaccine] Rash  . Sulfonamide Derivatives Rash    Antimicrobials this admission: Vancomycin 3/12>> Cefepime 3/12>>3/13 Ceftriaxone 3/13>>   Microbiology results: 3/12 BCx: sent 3/12 UCx: negative  Arrie Senate, PharmD, BCPS Clinical Pharmacist [phone] Please check AMION for all [place] numbers 07/29/2019

## 2019-07-29 NOTE — Consult Note (Signed)
[facility] Nurse wound follow up Wound type:[place] wounds to bilateral lower legs prior to discharge. Left lower leg more broken down than right.  LEft anterior lower leg is scabbed and erythematous. Edema noted to both legs. Discharging today.  Measurement: Left lower leg:  8 cm x 16 cm circumferential scabbed nonintact lesions Right lower leg with scattered 0.2 cm nonintact lesions with edema and erythema Wound [id number], pink moist and nonintact  Drainage (amount, consistency, odor) minimal serosanguinous  No odor.  Periwound:chronic skin changes.  Dressing procedure/placement/frequency: Cleanse bilateral legs with soap and water and pat dry.  Apply Santyl to wound bed of scabbed lesions. Cover both legs with Colbetasol cream.  Cover with NS moist gauze.  Wrap with kerlix from below toes to below knee.  Secure with ace wrap.  Change daily.  Will not follow at this time.  Please re-consult if needed.  Domenic Moras MSN, RN, FNP-BC CWON Wound, Ostomy, Continence Nurse Pager [phone]

## 2019-07-29 NOTE — Unmapped (Signed)
Prior Authorization sent to Insurance for [facility] on 07/29/19 via CMM.     Key: [id number]  RX: [id number]

## 2019-07-30 LAB — BASIC METABOLIC PANEL
Anion gap: 13 (ref 5–15)
BUN: 10 mg/dL (ref 6–20)
CO2: 31 mmol/L (ref 22–32)
Calcium: 9.3 mg/dL (ref 8.9–10.3)
Chloride: 92 mmol/L — ABNORMAL LOW (ref 98–111)
Creatinine, Ser: 0.67 mg/dL (ref 0.44–1.00)
GFR calc Af Amer: >60 mL/min (ref >60)
GFR calc non Af Amer: >60 mL/min (ref >60)
Glucose, Bld: 102 mg/dL — ABNORMAL HIGH (ref 70–99)
Potassium: 3.9 mmol/L (ref 3.5–5.1)
Sodium: 136 mmol/L (ref 135–145)

## 2019-07-30 MED ORDER — ENOXAPARIN SODIUM 80 MG/0.8ML ~~LOC~~ SOLN
0.5000 mg/kg | SUBCUTANEOUS | Status: DC
  Administered 2019-07-31: 75 mg via SUBCUTANEOUS
  Filled 2019-07-30: qty 0.8

## 2019-07-30 NOTE — Progress Notes (Signed)
[facility] PROGRESS NOTE    Progress Note  [name]  [id number] DOB: [birth year] DOA: 07/26/2019 PCP: [name], MD     Brief Narrative:   [name] is an 49 y.o. female medical history of polyarthritis nodosa depression left pain redness and swelling and fever that started a week prior to admission.  Assessment/Plan:   Sepsis due to bilateral lower extremity cellulitis/possible venous ulcer: She was started empirically on IV vancomycin and cefepime.  X-ray of the left and right foot showed no osteomyelitis, but a lot of edema, she appears significantly fluid overloaded on physical exam. Antibiotics have been de-escalate  to IV Vanco and Rocephin.  Legs are no longer purulent. Wound care has reevaluated the patient and recommended soap water and pat to dry and apply Santyl to scabs Physical therapy has evaluated the patient recommended home health PT. Legs looks much better today, will continue IV empiric antibiotics for an additional day, also continue IV Lasix, she should be ready to discharge within 24 to 48 hours.  Hypervolemic hyponatremia: I will continue IV Lasix continue strict I's and O's and daily weights her sodium is greater than 134 she has diuresed over 11 L despite having a normal 2D echo with a normal ejection fraction and no diastolic heart failure. She still appears fluid overloaded on physical exam, her creatinine is holding steady will continue IV Lasix, monitor strict I's and O's and daily weights.  Anxiety and depression: Continue current home medications.  Morbid obesity ([place]) [place].  Chronic diastolic heart failure: Monitor electrolytes and replete as needed. Strict I's and O's and daily weights restrict fluid. 2D echo done on 07/27/2019 showed: EF 60 to 65%. The  left ventricle has normal function. The left ventricle has no regional  wall motion abnormalities. There is moderate left ventricular hypertrophy.  Continue IV Lasix  monitor electrolytes and replete as needed, she has diuresed over 11 L, she still appears fluid overloaded on physical exam. Her blood pressure is tolerating diuresis, continue Avapro.  Essential hypertension: Blood pressure is improved continue beta-blocker, Avapro and IV Lasix.  Her blood pressure is at goal but I think she is overloaded on physical exam we will continue IV Lasix for another 24 hours. Recheck basic metabolic panel tomorrow morning.  Polyarthritis nodosa: She will continue her therapy at [facility], noted.  Anxiety and depression Continue Wellbutrin BuSpar and Klonopin discontinue Valium.  Normocytic anemia:  No signs of overt bleeding follow-up with PCP as an outpatient she will need a colonoscopy she has never had a colonoscopy.  Morbid obesity Noted counseled.  Stage II bilateral lower extremity wounds present on admission RN Pressure Injury Documentation:    Estimated body mass index is 60.77 kg/m as calculated from the following:   Height as of this encounter: 5\' 2"  (1.575 m).   Weight as of this encounter: 150.7 kg.  DVT prophylaxis: lovenox Family Communication:none Disposition Plan/Barrier to D/C: Once her infection is controlled, today she continues to have dead tissue.  Code Status:     Code Status Orders  (From admission, onward)         Start     Ordered   07/26/19 1523  Full code  Continuous     07/26/19 1524        Code Status History    Date Active Date Inactive Code Status Order ID Comments User Context   01/14/2016 0119 01/15/2016 1615 Full Code [id number]  Toy Baker, MD Inpatient  Advance Care Planning Activity        IV Access:    Peripheral IV   Procedures and diagnostic studies:   No results found.   Medical Consultants:    None.  Anti-Infectives:   IV vancomycin and Rocephin  Subjective:    [name] she has no new complaints today.  She was in a good mood happy and  emotional.  Objective:    Vitals:   07/29/19 1445 07/29/19 1511 07/29/19 2030 07/30/19 0501  BP:  122/76 105/66 131/90  Pulse: 73 70 72 (!) 52  Resp: 18 15    Temp:  97.7 F (36.5 C) 98.5 F (36.9 C) 98.3 F (36.8 C)  TempSrc:  Oral Oral Oral  SpO2: 98% 96% 98% 94%  Weight:    (!) 150.7 kg  Height:       SpO2: 94 % O2 Flow Rate (L/min): 2 L/min   Intake/Output Summary (Last 24 hours) at 07/30/2019 0907 Last data filed at 07/29/2019 2103 Gross per 24 hour  Intake 580 ml  Output 2800 ml  Net -2220 ml   Filed Weights   07/28/19 0412 07/29/19 0355 07/30/19 0501  Weight: (!) 155.9 kg (!) 152.3 kg (!) 150.7 kg    Exam: General exam: In no acute distress. Respiratory system: Good air movement and clear to auscultation. Cardiovascular system: S1 & S2 heard, RRR. No JVD. Gastrointestinal system: Abdomen is nondistended, soft and nontender.  Central nervous system: Alert and oriented. No focal neurological deficits. Extremities: No pedal edema. Skin: No rashes, lesions or ulcers  Data Reviewed:    Labs: Basic Metabolic Panel: Recent Labs  Lab 07/26/19 2013 07/26/19 2013 07/27/19 0302 07/27/19 0302 07/28/19 0411 07/28/19 0411 07/29/19 0409 07/30/19 0257  NA 126*  --  129*  --  134*  --  137 136  K 4.9   < > 4.0   < > 4.0   < > 3.7 3.9  CL 92*  --  94*  --  96*  --  91* 92*  CO2 24  --  23  --  28  --  32 31  GLUCOSE 96  --  111*  --  83  --  90 102*  BUN 9  --  9  --  10  --  9 10  CREATININE 0.59  --  0.61  --  0.63  --  0.78 0.67  CALCIUM 8.1*  --  8.1*  --  8.5*  --  9.0 9.3   < > = values in this interval not displayed.   GFR Estimated Creatinine Clearance: 121.3 mL/min (by C-G formula based on SCr of 0.67 mg/dL). Liver Function Tests: Recent Labs  Lab 07/26/19 1219  AST 22  ALT 15  ALKPHOS 113  BILITOT 0.4  PROT 6.4*  ALBUMIN 2.8*   No results for input(s): LIPASE, AMYLASE in the last 168 hours. No results for input(s): AMMONIA in the last  168 hours. Coagulation profile Recent Labs  Lab 07/26/19 1219  INR 1.2   COVID-19 Labs  No results for input(s): DDIMER, FERRITIN, LDH, CRP in the last 72 hours.  Lab Results  Component Value Date   [place] NEGATIVE 07/26/2019    CBC: Recent Labs  Lab 07/26/19 1219 07/26/19 1248 07/27/19 0302  WBC 10.8*  --  6.8  NEUTROABS 8.9*  --   --   HGB 10.5* 11.6* 10.7*  HCT 33.3* 34.0* 33.6*  MCV 86.3  --  84.0  PLT 294  --  284   Cardiac Enzymes: No results for input(s): CKTOTAL, CKMB, CKMBINDEX, TROPONINI in the last 168 hours. BNP (last 3 results) No results for input(s): PROBNP in the last 8760 hours. CBG: No results for input(s): GLUCAP in the last 168 hours. D-Dimer: No results for input(s): DDIMER in the last 72 hours. Hgb A1c: No results for input(s): HGBA1C in the last 72 hours. Lipid Profile: No results for input(s): CHOL, HDL, LDLCALC, TRIG, CHOLHDL, LDLDIRECT in the last 72 hours. Thyroid function studies: No results for input(s): TSH, T4TOTAL, T3FREE, THYROIDAB in the last 72 hours.  Invalid input(s): FREET3 Anemia work up: No results for input(s): VITAMINB12, FOLATE, FERRITIN, TIBC, IRON, RETICCTPCT in the last 72 hours. Sepsis Labs: Recent Labs  Lab 07/26/19 1219 07/26/19 1235 07/26/19 1628 07/27/19 0302  WBC 10.8*  --   --  6.8  LATICACIDVEN  --  0.9 0.6  --    Microbiology Recent Results (from the past 240 hour(s))  SARS CORONAVIRUS 2 (TAT 6-24 HRS) Nasopharyngeal Nasopharyngeal Swab     Status: None   Collection Time: 07/26/19  2:03 PM   Specimen: Nasopharyngeal Swab  Result Value Ref Range Status   SARS Coronavirus 2 NEGATIVE NEGATIVE Final    Comment: (NOTE) SARS-CoV-2 target nucleic acids are NOT DETECTED. The SARS-CoV-2 RNA is generally detectable in upper and lower respiratory specimens during the acute phase of infection. Negative results do not preclude SARS-CoV-2 infection, do not rule out co-infections with other pathogens,  and should not be used as the sole basis for treatment or other patient management decisions. Negative results must be combined with clinical observations, patient history, and epidemiological information. The expected result is Negative. Fact Sheet for Patients: SugarRoll.be Fact Sheet for Healthcare Providers: https://www.woods-mathews.com/ This test is not yet approved or cleared by the [place] FDA and  has been authorized for detection and/or diagnosis of SARS-CoV-2 by FDA under an Emergency Use Authorization (EUA). This EUA will remain  in effect (meaning this test can be used) for the duration of the COVID-19 declaration under Section 56 4(b)(1) of the Act, 21 U.S.C. section 360bbb-3(b)(1), unless the authorization is terminated or revoked sooner. Performed at [facility], [place] [address], [place], [place] [address]   Urine culture     Status: None   Collection Time: 07/26/19  2:58 PM   Specimen: In/Out Cath Urine  Result Value Ref Range Status   Specimen Description IN/OUT CATH URINE  Final   Special Requests NONE  Final   Culture   Final    NO GROWTH Performed at [facility], [place] [address] [place], [place] [address]    Report Status 07/27/2019 FINAL  Final  MRSA PCR Screening     Status: Abnormal   Collection Time: 07/26/19  6:40 PM   Specimen: Nasopharyngeal  Result Value Ref Range Status   MRSA by PCR POSITIVE (A) NEGATIVE Final    Comment:        The GeneXpert MRSA Assay (FDA approved for NASAL specimens only), is one component of a comprehensive MRSA colonization surveillance program. It is not intended to diagnose MRSA infection nor to guide or monitor treatment for MRSA infections. RESULT CALLED TO, READ BACK BY AND VERIFIED WITH: Sherri Rad RN 07/26/19 2029 JDW Performed at [facility] [address] [place], [place] [address]      Medications:   . buPROPion  150 mg Oral Daily  .  busPIRone  15 mg Oral BID  .  clobetasol cream  1 application Topical BID  . clonazePAM  1 mg Oral QHS  . collagenase   Topical Daily  . DULoxetine  60 mg Oral Daily  . enoxaparin (LOVENOX) injection  80 mg Subcutaneous Q24H  . furosemide  80 mg Intravenous BID  . gabapentin  1,200 mg Oral TID  . irbesartan  75 mg Oral Daily  . mupirocin ointment  1 application Nasal BID  . nebivolol  10 mg Oral Daily  . nystatin  1 application Topical QID  . Oxcarbazepine  300 mg Oral BID  . pantoprazole  40 mg Oral BID  . pentoxifylline  400 mg Oral TID WC  . rosuvastatin  20 mg Oral QPM  . sodium chloride  1 g Oral BID WC   Continuous Infusions: . cefTRIAXone (ROCEPHIN)  IV 2 g (07/29/19 0921)  . vancomycin 750 mg (07/30/19 [id number])      LOS: 4 days   Charlynne Cousins  [facility]  07/30/2019, 9:07 AM

## 2019-07-31 DIAGNOSIS — D849 Immunodeficiency, unspecified: Secondary | ICD-10-CM

## 2019-07-31 LAB — BASIC METABOLIC PANEL
Anion gap: 18 — ABNORMAL HIGH (ref 5–15)
BUN: 14 mg/dL (ref 6–20)
CO2: 29 mmol/L (ref 22–32)
Calcium: 9.5 mg/dL (ref 8.9–10.3)
Chloride: 89 mmol/L — ABNORMAL LOW (ref 98–111)
Creatinine, Ser: 0.65 mg/dL (ref 0.44–1.00)
GFR calc Af Amer: >60 mL/min (ref >60)
GFR calc non Af Amer: >60 mL/min (ref >60)
Glucose, Bld: 98 mg/dL (ref 70–99)
Potassium: 3.5 mmol/L (ref 3.5–5.1)
Sodium: 136 mmol/L (ref 135–145)

## 2019-07-31 LAB — CULTURE, BLOOD (ROUTINE X 2): Culture: NO GROWTH

## 2019-07-31 NOTE — Progress Notes (Signed)
Physical Therapy Treatment Patient Details Name: [name] MRN: [id number] DOB: 16-Apr-1971 Today's Date: 07/31/2019    History of Present Illness 49 y.o. female medical history of polyarthritis nodosa depression left pain redness and swelling and fever that started a week prior to admission. Presented to hospital with leg pain, rash and fever. Pt dx with sepsis sec to BLE cellulitis    PT Comments    Pt found sitting in recliner, pain seems to be controlled well this session. Pt completed seated therapeutic exercises in recliner prior to ambulation w/ cues. Pt ambulated 1 x 30ft with RW and SBA then after seated rest break further 106ft w/ RW and SBA. Pt did very well with mobility and tx this day. Will continue to follow acutely and progress as she tolerates.     Follow Up Recommendations  Home health PT     Equipment Recommendations  None recommended by PT    Recommendations for Other Services       Precautions / Restrictions Precautions Precautions: Fall Precaution Comments: BLE skin integrity Restrictions Weight Bearing Restrictions: No    Mobility  Bed Mobility               General bed mobility comments: pt sitting in recliner at therapist arrival  Transfers Overall transfer level: Needs assistance Equipment used: Rolling walker (2 wheeled) Transfers: Sit to/from [employer] Transfers Sit to Stand: Supervision Stand pivot transfers: Supervision       General transfer comment: needs vcs for safety with walker  Ambulation/Gait Ambulation/Gait assistance: Supervision Gait Distance (Feet): 96 Feet Assistive device: Rolling walker (2 wheeled) Gait Pattern/deviations: Wide base of support;Shuffle Gait velocity: dec   General Gait Details: ambulated 1 x 13ft with RW and SBA, 1 x 84ft w/ RW and SBA, on room air and sats in 90s   Stairs             Wheelchair Mobility    Modified Rankin (Stroke Patients Only)       Balance Overall  balance assessment: Needs assistance Sitting-balance support: Feet supported Sitting balance-Leahy Scale: Good     Standing balance support: During functional activity;Bilateral upper extremity supported Standing balance-Leahy Scale: Fair                              Cognition Arousal/Alertness: Awake/alert Behavior During Therapy: WFL for tasks assessed/performed Overall Cognitive Status: No family/caregiver present to determine baseline cognitive functioning                                 General Comments: seems to be at baseline but has moments of mumbling about unknown things      Exercises General Exercises - Lower Extremity Ankle Circles/Pumps: AROM;Strengthening;20 reps;Both Long Arc Quad: AROM;Strengthening;Both;20 reps Hip Flexion/Marching: AROM;Strengthening;Both;20 reps    General Comments General comments (skin integrity, edema, etc.): BEL wrapped today, no weeping noted on sheets      Pertinent Vitals/Pain Pain Assessment: No/denies pain    Home Living                      Prior Function            PT Goals (current goals can now be found in the care plan section) Acute Rehab PT Goals Patient Stated Goal: get better PT Goal Formulation: With patient Time For Goal Achievement: 08/10/19 Potential to  Achieve Goals: Good Progress towards PT goals: Progressing toward goals    Frequency    Min 3X/week      PT Plan Current plan remains appropriate    Co-evaluation              AM-PAC PT "6 Clicks" Mobility   Outcome Measure  Help needed turning from your back to your side while in a flat bed without using bedrails?: A Little Help needed moving from lying on your back to sitting on the side of a flat bed without using bedrails?: A Little Help needed moving to and from a bed to a chair (including a wheelchair)?: A Little Help needed standing up from a chair using your arms (e.g., wheelchair or bedside  chair)?: A Little Help needed to walk in hospital room?: A Little Help needed climbing 3-5 steps with a railing? : A Lot 6 Click Score: 17    End of Session   Activity Tolerance: Patient limited by fatigue;Patient tolerated treatment well Patient left: in chair;with call bell/phone within reach Nurse Communication: Mobility status PT Visit Diagnosis: Other abnormalities of gait and mobility (R26.89);Pain     Time: [id number] PT Time Calculation (min) (ACUTE ONLY): 24 min  Charges:  $Gait Training: 8-22 mins $Therapeutic Exercise: 8-22 mins                     Horald Chestnut, PT    Delford Field 07/31/2019, 3:31 PM

## 2019-07-31 NOTE — Progress Notes (Signed)
PROGRESS NOTE  MARION SEESE [id number] DOB: 1970-06-30 DOA: 07/26/2019 PCP: [name], MD  CAITRIN PENDERGRAPH is an 49 y.o. female medical history of polyarthritis nodosa depression left pain redness and swelling and fever that started a week prior to admission.   HPI/Recap of past 24 hours: T-max 99.4 Feeling better, minimal pain Legs are wrapped, remain edematous with erythema, no draining wound 3 L urine output documented last 24 hours  Assessment/Plan: Active Problems:   Morbid obesity ([place])   Anxiety and depression   Cellulitis   PAN (polyarteritis nodosa) (HCC)   Sepsis ([place])   Hyponatremia  Sepsis due to bilateral lower extremity cellulitis/possible venous ulcer: X-ray of bilateral foot no osteomyelitis Legs with purulent draining initially MRSA screen positive she has been on IV vancomycin and cefepime, then IV vancomycin and Rocephin, improving, fever resolved, leukocytosis resolved, bilateral lower extremity wound however is extensive from lower extremity extending to bilateral inner thighs, remain edematous and erythematous, she has history of multiple oral antibiotic allergy, will continue vanc and Rocephin since she is improving on this Continue IV Lasix Appreciate wound care "soap water and pat to dry and apply Santyl to scabs, Cover both legs with Colbetasol cream.  Cover with NS moist gauze.  Wrap with kerlix from below toes to below knee.  Secure with ace wrap.  Change daily.  Will need home health RN for wound care  Hypervolemic hyponatremia: Sodium 122 on presentation Improving on IV Lasix, continue  Acute on chronic diastolic CHF exacerbation 2D echo done on 07/27/2019 showed: EF 60 to 65%. The  left ventricle has normal function. The left ventricle has no regional  wall motion abnormalities. There is moderate left ventricular hypertrophy.  She is treated with IV Lasix 80 mg twice a day, remain large volume overloaded, continue IV Lasix Clinically  improving, telemetry unremarkable, we will DC telemetry  Essential hypertension: Blood pressure stable on current regimen continue  Normocytic anemia Hemoglobin stable above 10  History of polyarthritis nodosa/immunosuppressed status Getting Skyrizi injection every 3 months, [facility]ed at [facility] rheumatology   Anxiety and depression Continue Wellbutrin BuSpar and Klonopin discontinue Valium.  Class III obesity Body mass index is 59.52 kg/m. Lifestyle changes  DVT Prophylaxis: Lovenox   Code Status: full  Family Communication: patient   Disposition Plan:    Patient came from:          home                                                                                                Anticipated d/c place: home  Barriers to d/c OR conditions which need to be met to effect a safe d/c: remain on iv abx and iv lasix, will need to set up home health, likely d/c in 1-2 days   Consultants:  Wound care  Physical therapy   Case management  Procedures:  None  Antibiotics:  Vanco cefepime, then Vanco Rocephin   Objective: BP 132/87 (BP Location: Right Wrist)   Pulse 89   Temp 99.4 F (37.4 C) (Oral)   Resp 16   Ht  '5\' 2"'  (1.575 m)   Wt (!) 147.6 kg   SpO2 94%   BMI 59.52 kg/m   Intake/Output Summary (Last 24 hours) at 07/31/2019 1507 Last data filed at 07/31/2019 1112 Gross per 24 hour  Intake 1250 ml  Output 2250 ml  Net -1000 ml   Filed Weights   07/29/19 0355 07/30/19 0501 07/31/19 0500  Weight: (!) 152.3 kg (!) 150.7 kg (!) 147.6 kg    Exam: Patient is examined daily including today on 07/31/2019, exams remain the same as of yesterday except that has changed    General:  NAD, pleasant  Cardiovascular: RRR  Respiratory: CTABL  Abdomen: Soft/ND/NT, positive BS  Musculoskeletal: Bilateral lower extremity edema, erythema, Ace wrap to lower extremity  Neuro: alert, oriented   Data Reviewed: Basic Metabolic Panel: Recent Labs  Lab  07/27/19 0302 07/28/19 0411 07/29/19 0409 07/30/19 0257 07/31/19 0448  NA 129* 134* 137 136 136  K 4.0 4.0 3.7 3.9 3.5  CL 94* 96* 91* 92* 89*  CO2 23 28 32 31 29  GLUCOSE 111* 83 90 102* 98  BUN '9 10 9 10 14  ' CREATININE 0.61 0.63 0.78 0.67 0.65  CALCIUM 8.1* 8.5* 9.0 9.3 9.5   Liver Function Tests: Recent Labs  Lab 07/26/19 1219  AST 22  ALT 15  ALKPHOS 113  BILITOT 0.4  PROT 6.4*  ALBUMIN 2.8*   No results for input(s): LIPASE, AMYLASE in the last 168 hours. No results for input(s): AMMONIA in the last 168 hours. CBC: Recent Labs  Lab 07/26/19 1219 07/26/19 1248 07/27/19 0302  WBC 10.8*  --  6.8  NEUTROABS 8.9*  --   --   HGB 10.5* 11.6* 10.7*  HCT 33.3* 34.0* 33.6*  MCV 86.3  --  84.0  PLT 294  --  284   Cardiac Enzymes:   No results for input(s): CKTOTAL, CKMB, CKMBINDEX, TROPONINI in the last 168 hours. BNP (last 3 results) No results for input(s): BNP in the last 8760 hours.  ProBNP (last 3 results) No results for input(s): PROBNP in the last 8760 hours.  CBG: No results for input(s): GLUCAP in the last 168 hours.  Recent Results (from the past 240 hour(s))  Blood Culture (routine x 2)     Status: None   Collection Time: 07/26/19 12:36 PM   Specimen: BLOOD LEFT HAND  Result Value Ref Range Status   Specimen Description BLOOD LEFT HAND  Final   Special Requests   Final    Blood Culture results may not be optimal due to an inadequate volume of blood received in culture bottles   Culture   Final    NO GROWTH 5 DAYS Performed at [facility], [place] [address], [place], [place] [address]    Report Status 07/31/2019 FINAL  Final  SARS CORONAVIRUS 2 (TAT 6-24 HRS) Nasopharyngeal Nasopharyngeal Swab     Status: None   Collection Time: 07/26/19  2:03 PM   Specimen: Nasopharyngeal Swab  Result Value Ref Range Status   SARS Coronavirus 2 NEGATIVE NEGATIVE Final    Comment: (NOTE) SARS-CoV-2 target nucleic acids are NOT DETECTED. The SARS-CoV-2  RNA is generally detectable in upper and lower respiratory specimens during the acute phase of infection. Negative results do not preclude SARS-CoV-2 infection, do not rule out co-infections with other pathogens, and should not be used as the sole basis for treatment or other patient management decisions. Negative results must be combined with clinical observations, patient history, and epidemiological  information. The expected result is Negative. Fact Sheet for Patients: SugarRoll.be Fact Sheet for Healthcare Providers: https://www.woods-mathews.com/ This test is not yet approved or cleared by the [place] FDA and  has been authorized for detection and/or diagnosis of SARS-CoV-2 by FDA under an Emergency Use Authorization (EUA). This EUA will remain  in effect (meaning this test can be used) for the duration of the COVID-19 declaration under Section 56 4(b)(1) of the Act, 21 U.S.C. section 360bbb-3(b)(1), unless the authorization is terminated or revoked sooner. Performed at [facility], [place] [address] [place], [place] [address]   Urine culture     Status: None   Collection Time: 07/26/19  2:58 PM   Specimen: In/Out Cath Urine  Result Value Ref Range Status   Specimen Description IN/OUT CATH URINE  Final   Special Requests NONE  Final   Culture   Final    NO GROWTH Performed at [facility], [place] [address], [place], [place] [address]    Report Status 07/27/2019 FINAL  Final  MRSA PCR Screening     Status: Abnormal   Collection Time: 07/26/19  6:40 PM   Specimen: Nasopharyngeal  Result Value Ref Range Status   MRSA by PCR POSITIVE (A) NEGATIVE Final    Comment:        The GeneXpert MRSA Assay (FDA approved for NASAL specimens only), is one component of a comprehensive MRSA colonization surveillance program. It is not intended to diagnose MRSA infection nor to guide or monitor treatment for MRSA  infections. RESULT CALLED TO, READ BACK BY AND VERIFIED WITH: Sherri Rad RN 07/26/19 2029 JDW Performed at [facility] [address], [place], [place] [address]      Studies: No results found.  Scheduled Meds: . buPROPion  150 mg Oral Daily  . busPIRone  15 mg Oral BID  . clobetasol cream  1 application Topical BID  . clonazePAM  1 mg Oral QHS  . collagenase   Topical Daily  . DULoxetine  60 mg Oral Daily  . enoxaparin (LOVENOX) injection  0.5 mg/kg Subcutaneous Q24H  . furosemide  80 mg Intravenous BID  . gabapentin  1,200 mg Oral TID  . irbesartan  75 mg Oral Daily  . mupirocin ointment  1 application Nasal BID  . nebivolol  10 mg Oral Daily  . nystatin  1 application Topical QID  . Oxcarbazepine  300 mg Oral BID  . pantoprazole  40 mg Oral BID  . pentoxifylline  400 mg Oral TID WC  . rosuvastatin  20 mg Oral QPM  . sodium chloride  1 g Oral BID WC    Continuous Infusions: . cefTRIAXone (ROCEPHIN)  IV 2 g (07/31/19 0932)  . vancomycin 750 mg (07/31/19 1440)     Time spent: 86mns I have personally reviewed and interpreted on  07/31/2019 daily labs, tele strips, imagings as discussed above under date review session and assessment and plans.  I reviewed all nursing notes, pharmacy notes, consultant notes,  vitals, pertinent old records  I have discussed plan of care as described above with RN , patient  on 07/31/2019   FFlorencia ReasonsMD, PhD, FACP  [facility]  Available via Epic secure chat 7am-7pm for nonurgent issues Please page for urgent issues, pager number available through [email or web address] .   07/31/2019, 3:07 PM  LOS: 5 days

## 2019-08-01 DIAGNOSIS — M300 Polyarteritis nodosa: Principal | ICD-10-CM

## 2019-08-01 LAB — VANCOMYCIN, TROUGH: Vancomycin Tr: 20 ug/mL (ref 15–20)

## 2019-08-01 LAB — BASIC METABOLIC PANEL
Anion gap: 15 (ref 5–15)
BUN: 19 mg/dL (ref 6–20)
CO2: 30 mmol/L (ref 22–32)
Calcium: 9.4 mg/dL (ref 8.9–10.3)
Chloride: 89 mmol/L — ABNORMAL LOW (ref 98–111)
Creatinine, Ser: 0.67 mg/dL (ref 0.44–1.00)
GFR calc Af Amer: >60 mL/min (ref >60)
GFR calc non Af Amer: >60 mL/min (ref >60)
Glucose, Bld: 102 mg/dL — ABNORMAL HIGH (ref 70–99)
Potassium: 3.5 mmol/L (ref 3.5–5.1)
Sodium: 134 mmol/L — ABNORMAL LOW (ref 135–145)

## 2019-08-01 LAB — CULTURE, BLOOD (ROUTINE X 2)
Culture: NO GROWTH
Special Requests: ADEQUATE

## 2019-08-01 LAB — MAGNESIUM: Magnesium: 1.9 mg/dL (ref 1.7–2.4)

## 2019-08-01 MED ORDER — VANCOMYCIN HCL 750 MG/150ML IV SOLN
750.0000 mg | Freq: Two times a day (BID) | INTRAVENOUS | Status: DC
  Administered 2019-08-01 – 2019-08-02 (×2): 750 mg via INTRAVENOUS
  Filled 2019-08-01 (×3): qty 150

## 2019-08-01 MED ORDER — ENOXAPARIN SODIUM 80 MG/0.8ML ~~LOC~~ SOLN
70.0000 mg | SUBCUTANEOUS | Status: DC
  Administered 2019-08-01 – 2019-08-02 (×2): 70 mg via SUBCUTANEOUS
  Filled 2019-08-01 (×2): qty 0.8

## 2019-08-01 MED ORDER — POTASSIUM CHLORIDE CRYS ER 20 MEQ PO TBCR
40.0000 meq | EXTENDED_RELEASE_TABLET | Freq: Once | ORAL | Status: AC
  Administered 2019-08-01: 12:00:00 40 meq via ORAL
  Filled 2019-08-01: qty 2

## 2019-08-01 MED ORDER — SENNOSIDES-DOCUSATE SODIUM 8.6-50 MG PO TABS
1.0000 | ORAL_TABLET | Freq: Two times a day (BID) | ORAL | Status: DC
  Administered 2019-08-01 – 2019-08-02 (×2): 1 via ORAL
  Filled 2019-08-01 (×3): qty 1

## 2019-08-01 NOTE — Unmapped (Signed)
Fax received from the pharmacy requesting a refill on Pentoxifylline tabs. Patient was last seen in the office on 03/13/19. Please advise, Thanks!

## 2019-08-01 NOTE — Unmapped (Signed)
PA denial for halobetasol due to not having tried preferred drugs such as betamethasone dipropionate ointment, betamethasone augmented ointment, fluocinolone ointment, mometasone ointment, and triamcinolone ointment. Scanned into media.

## 2019-08-01 NOTE — Progress Notes (Signed)
Physical Therapy Treatment Patient Details Name: [name] MRN: [id number] DOB: [birth year] Today's Date: 08/01/2019    History of Present Illness 49 y.o. female medical history of polyarthritis nodosa depression left pain redness and swelling and fever that started a week prior to admission. Presented to hospital with leg pain, rash and fever. Pt dx with sepsis sec to BLE cellulitis    PT Comments    Pt continues to progress with mobility and independence. This am arrived to find pt sitting in recliner states needed to use rest room but did not make it. Pt has been instructed to use BSC next to recliner as needed has been trained in proper tramsfer techniques, with return demo. Pt was able to ambulate in room x 57ft with RW and SBA and also in hall x 150ft with RW and SBA. No frank balance deficits noted and on RA VSS. Pt needs to continue to work on independence and hence safety with mobility also activity tolerance.     Follow Up Recommendations  Home health PT     Equipment Recommendations  None recommended by PT    Recommendations for Other Services       Precautions / Restrictions Precautions Precautions: Fall Precaution Comments: BLE skin integrity Restrictions Weight Bearing Restrictions: No    Mobility  Bed Mobility               General bed mobility comments: pt sitting in recliner at therapist arrival  Transfers Overall transfer level: Needs assistance Equipment used: Rolling walker (2 wheeled) Transfers: Stand Pivot Transfers;Sit to/from Stand Sit to Stand: Modified independent (Device/Increase time) Stand pivot transfers: Supervision          Ambulation/Gait Ambulation/Gait assistance: Supervision Gait Distance (Feet): 112 Feet Assistive device: Rolling walker (2 wheeled) Gait Pattern/deviations: Wide base of support;Shuffle Gait velocity: dec   General Gait Details: ambulated 1 x 40 ft in room then 1 x 163ft in hall with RW and  SBA   Stairs             Wheelchair Mobility    Modified Rankin (Stroke Patients Only)       Balance Overall balance assessment: Mild deficits observed, not formally tested                                          Cognition Arousal/Alertness: Awake/alert Behavior During Therapy: WFL for tasks assessed/performed Overall Cognitive Status: No family/caregiver present to determine baseline cognitive functioning                                        Exercises General Exercises - Lower Extremity Ankle Circles/Pumps: AROM;Strengthening;20 reps;Both Long Arc Quad: AROM;Strengthening;Both;20 reps Hip Flexion/Marching: AROM;Strengthening;Both;20 reps    General Comments General comments (skin integrity, edema, etc.): on room air VSS      Pertinent Vitals/Pain Pain Assessment: No/denies pain    Home Living                      Prior Function            PT Goals (current goals can now be found in the care plan section) Acute Rehab PT Goals Patient Stated Goal: get better PT Goal Formulation: With patient Time For Goal Achievement: 08/10/19 Potential to Achieve Goals:  Good Progress towards PT goals: Progressing toward goals    Frequency    Min 3X/week      PT Plan Current plan remains appropriate    Co-evaluation              AM-PAC PT "6 Clicks" Mobility   Outcome Measure  Help needed turning from your back to your side while in a flat bed without using bedrails?: A Little Help needed moving from lying on your back to sitting on the side of a flat bed without using bedrails?: A Little Help needed moving to and from a bed to a chair (including a wheelchair)?: A Little Help needed standing up from a chair using your arms (e.g., wheelchair or bedside chair)?: A Little Help needed to walk in hospital room?: A Little Help needed climbing 3-5 steps with a railing? : A Lot 6 Click Score: 17    End of  Session   Activity Tolerance: Patient tolerated treatment well Patient left: in chair;with call bell/phone within reach Nurse Communication: Mobility status PT Visit Diagnosis: Other abnormalities of gait and mobility (R26.89);Pain     Time: 1131-1209 PT Time Calculation (min) (ACUTE ONLY): 38 min  Charges:  $Gait Training: 8-22 mins $Therapeutic Exercise: 8-22 mins $Therapeutic Activity: 8-22 mins                     Horald Chestnut, PT    Delford Field 08/01/2019, 1:05 PM

## 2019-08-01 NOTE — Progress Notes (Signed)
Patient refused CPAP for the night  

## 2019-08-01 NOTE — TOC Initial Note (Addendum)
Transition of Care [facility]) - Initial/Assessment Note    Patient Details  Name: [name] MRN: [id number] Date of Birth: 1970-08-24  Transition of Care [facility]) CM/SW Contact:    Arvella Merles, LCSW Phone Number: 08/01/2019, 2:52 PM  Clinical Narrative:                 2:45p: CSW informed [name], both unable to take patient. CSW provided referral to [facility]. [name] is able to accept patient and meet patient's needs. CSW spoke with patient explained she will need a teachable caregiver available for wound care. Patient expressed she has someone who can meet the needs when home health is not available. CSW also requested patient identify someone who can learn the wound care prior to her discharge from the hospital. [facility] team will continue to follow and assist with discharge planning needs.  2:50p: CSW received consult for possible home health services at time of discharge. CSW spoke with patient regarding PT recommendation of Home Health PT at time of discharge. Patient reported that she is in agreement with home health services. CSW sent referral for review. CSW provided Medicare [place] ratings list. CSW confirmed address and phone number with patient.    CSW spoke with Tiffany of [name] for referral. Jonelle Sidle will follow-up with CSW to confirm. No further questions reported at this time. CSW to continue to follow and assist with discharge planning needs.   Expected Discharge Plan: [place] Barriers to Discharge: Continued Medical Work up   Patient Goals and CMS Choice Patient states their goals for this hospitalization and ongoing recovery are:: To get better CMS Medicare.gov Compare Post Acute Care list provided to:: Patient Choice offered to / list presented to : Patient  Expected Discharge Plan and Services Expected Discharge Plan: [place] In-house Referral: Clinical Social Work   Post Acute Care Choice: [place]  arrangements for the past 2 months: [place]                         Representative spoke with at DME Agency: [place]: PT, RN [place] Agency: [name] (formerly [employer]) Date [place]: 08/01/19 Time [place]: 0215 Representative spoke with at [place]: [place] Arrangements/Services Living arrangements for the past 2 months: [place] Lives with:: Adult Children Patient language and need for interpreter reviewed:: Yes Do you feel safe going back to the place where you live?: Yes      Need for Family Participation in Patient Care: No (Comment) Care giver support system in place?: Yes (comment)   Criminal Activity/Legal Involvement Pertinent to Current Situation/Hospitalization: No - Comment as needed  Activities of Daily Living Home Assistive Devices/Equipment: None ADL Screening (condition at time of admission) Patient's cognitive ability adequate to safely complete daily activities?: Yes Is the patient deaf or have difficulty hearing?: No Does the patient have difficulty seeing, even when wearing glasses/contacts?: No Does the patient have difficulty concentrating, remembering, or making decisions?: No Patient able to express need for assistance with ADLs?: Yes Does the patient have difficulty dressing or bathing?: Yes Independently performs ADLs?: No Communication: Independent Dressing (OT): Independent Grooming: Needs assistance Is this a change from baseline?: Pre-admission baseline Feeding: Independent Bathing: Needs assistance Is this a change from baseline?: Pre-admission baseline Toileting: Needs assistance Is this a change from baseline?: Pre-admission baseline In/Out Bed: Needs  assistance Is this a change from baseline?: Pre-admission baseline Does the patient have difficulty walking or climbing stairs?: Yes Weakness of Legs: Both Weakness of Arms/Hands: Both  Permission  Sought/Granted Permission sought to share information with : Chartered certified accountant granted to share information with : Yes, Verbal Permission Granted     Permission granted to share info w AGENCY: Home Health        Emotional Assessment Appearance:: Appears stated age Attitude/Demeanor/Rapport: Engaged Affect (typically observed): Appropriate, Pleasant, Happy Orientation: : Oriented to Self, Oriented to Place, Oriented to  Time, Oriented to Situation Alcohol / Substance Use: Not Applicable Psych Involvement: No (comment)  Admission diagnosis:  Cellulitis [L03.90] Sepsis ([place]) [A41.9] Cellulitis of lower extremity, unspecified laterality [id number] Sepsis, due to unspecified organism, unspecified whether acute organ dysfunction present [facility]) [A41.9] Patient Active Problem List   Diagnosis Date Noted  . Hyponatremia 07/27/2019  . Sepsis ([place]) 07/26/2019  . Urinary urgency 04/04/2018  . Chronic narcotic use 09/07/2016  . Acute blood loss anemia 01/14/2016  . Multiple gastric ulcers   . PAN (polyarteritis nodosa) ([place]) 11/24/2015  . Chronic gout of multiple sites 02/25/2014  . GERD (gastroesophageal reflux disease) 01/08/2014  . Routine general medical examination at a health care facility 04/23/2013  . Bariatric surgery status 10/31/2012  . Psoriasis 01/09/2012  . DDD (degenerative disc disease), lumbar 11/30/2011  . Migraine headache   . [name] (obstructive sleep apnea) 11/16/2010  . HYPERGLYCEMIA, FASTING 10/08/2009  . CONTRACTURE OF TENDON 08/17/2009  . PARESTHESIA, HANDS 12/23/2008  . Leg pain, right 11/05/2008  . ADVERSE DRUG REACTION 04/03/2008  . PERIPHERAL EDEMA 03/26/2008  . Morbid obesity ([place]) 01/03/2007  . Cellulitis 01/03/2007  . Hyperlipidemia 09/22/2006  . Fibromyalgia 09/20/2006  . Anxiety and depression 06/28/2006   PCP:  [name], MD Pharmacy:   [facility] [place], [place] - [address]  [place] [address] Phone: [phone] Fax: [phone]     Social Determinants of Health (SDOH) Interventions    Readmission Risk Interventions No flowsheet data found.

## 2019-08-01 NOTE — Care Management Important Message (Signed)
Important Message  Patient Details  Name: [name] MRN: [id number] Date of Birth: Jul 16, 1970   Medicare Important Message Given:  Yes     Shelda Altes 08/01/2019, 12:35 PM

## 2019-08-01 NOTE — Progress Notes (Signed)
Pharmacy Antibiotic Note  [name] is a 49 y.o. female admitted on 07/26/2019 with sepsis.  Pharmacy has been consulted for vancomycin dosing, also on ceftriaxone. Cellulitis has improved, WBC normal and pt afebrile. However, discussed with Dr Erlinda Hong 3/17 and given severity of cellulitis on admit, ABX will be continued for now. Initially planned to transition to PO, but pt has extensive allergy history so will continue IV vancomycin and ceftriaxone for a few more days.   Vancomycin trough supratherapeutic for cellulitis at 20 mcg/ml, Cr remains stable.  Plan: -Reduce vancomycin to 750mg  IV q12h -Ceftriaxone 2g IV q24h -Watch renal function closely   Height: 5\' 2"  (157.5 cm) Weight: (!) 320 lb 12.3 oz (145.5 kg) IBW/kg (Calculated) : 50.1  Temp (24hrs), Avg:98.4 F (36.9 C), Min:97.6 F (36.4 C), Max:99.4 F (37.4 C)  Recent Labs  Lab 07/26/19 1219 07/26/19 1235 07/26/19 1628 07/26/19 2013 07/27/19 0302 07/27/19 0302 07/28/19 0411 07/29/19 0409 07/30/19 0257 07/31/19 0448 08/01/19 0551  WBC 10.8*  --   --   --  6.8  --   --   --   --   --   --   CREATININE 0.61  --   --    < > 0.61   < > 0.63 0.78 0.67 0.65 0.67  LATICACIDVEN  --  0.9 0.6  --   --   --   --   --   --   --   --   VANCOTROUGH  --   --   --   --   --   --   --   --   --   --  20   < > = values in this interval not displayed.    Estimated Creatinine Clearance: 118.6 mL/min (by C-G formula based on SCr of 0.67 mg/dL).    Allergies  Allergen Reactions  . Niacin Anaphylaxis, Swelling and Other (See Comments)    REACTION: Swelling, problems breathing  . Sulfamethoxazole-Trimethoprim Anaphylaxis, Swelling, Rash and Other (See Comments)    REACTION: Rash, throat closed, eyes swelled.  . Aspirin Hives  . Bactrim Hives  . Benzoin Compound Rash  . Cephalexin Hives    Tolerated ceftriaxone and cefepime 07/2019  . Clindamycin/Lincomycin Hives  . Doxycycline Hives  . Iohexol Hives    Pt treated with PO benedryl   . Lisinopril Hives  . Naproxen Hives  . Pnu-Imune [Pneumococcal Polysaccharide Vaccine] Rash  . Sulfonamide Derivatives Rash    Antimicrobials this admission: Vancomycin 3/12>> Cefepime 3/12>>3/13 Ceftriaxone 3/13>>   Microbiology results: 3/12 BCx: negative 3/12 UCx: negative  Arrie Senate, PharmD, BCPS Clinical Pharmacist [phone] Please check AMION for all [place] numbers 08/01/2019

## 2019-08-01 NOTE — Progress Notes (Signed)
PROGRESS NOTE  BRENLEIGH COLLET [id number] DOB: Dec 05, 1970 DOA: 07/26/2019 PCP: [name], MD  CHRYS LANDGREBE is an 49 y.o. female medical history of polyarthritis nodosa depression left pain redness and swelling and fever that started a week prior to admission.   HPI/Recap of past 24 hours: T-max 98.6 Feeling better, minimal pain Legs remain edematous with erythema, small draining wound left calf 3.6 L urine output documented last 24 hours, cr stable  Assessment/Plan: Active Problems:   Morbid obesity ([place])   Anxiety and depression   Cellulitis   PAN (polyarteritis nodosa) (HCC)   Sepsis ([place])   Hyponatremia  Sepsis due to bilateral lower extremity cellulitis/possible venous ulcer: X-ray of bilateral foot no osteomyelitis Legs/thighs with open wound and  purulent draining initially MRSA screen positive Blood culture no growth she has been on IV vancomycin and cefepime, then IV vancomycin and Rocephin, improving, fever resolved, leukocytosis resolved, bilateral lower extremity wound however is extensive from lower extremity extending to bilateral inner thighs, remain edematous and erythematous, she has history of multiple oral antibiotic allergy, will continue vanc and Rocephin since she is improving on this Continue IV Lasix Appreciate wound care "soap water and pat to dry and apply Santyl to scabs, Cover both legs with Colbetasol cream.  Cover with NS moist gauze.  Wrap with kerlix from below toes to below knee.  Secure with ace wrap.  Change daily.  Will need home health RN for wound care  Hypervolemic hyponatremia: Sodium 122 on presentation Improving on IV Lasix, continue  Acute on chronic diastolic CHF exacerbation 2D echo done on 07/27/2019 showed: EF 60 to 65%. The  left ventricle has normal function. The left ventricle has no regional  wall motion abnormalities. There is moderate left ventricular hypertrophy.  She is treated with IV Lasix 80 mg twice a day,  remain large volume overloaded, continue IV Lasix   Essential hypertension: Blood pressure stable on current regimen continue  Normocytic anemia Hemoglobin stable above 10  History of polyarthritis nodosa/immunosuppressed status Getting Skyrizi injection every 3 months, Followed at [facility] rheumatology   Anxiety and depression Continue Wellbutrin BuSpar and Klonopin discontinue Valium.  Class III obesity/[name] on cpap Body mass index is 58.67 kg/m. Lifestyle changes  DVT Prophylaxis: Lovenox   Code Status: full  Family Communication: patient   Disposition Plan:    Patient came from:          home                                                                                                Anticipated d/c place: home with home health  Barriers to d/c OR conditions which need to be met to effect a safe d/c: remain on iv abx and iv lasix, will need to set up home health, likely d/c tomorrow   Consultants:  Wound care  Physical therapy   Case management  Procedures:  None  Antibiotics:  Vanco cefepime, then Vanco Rocephin   Objective: BP 114/68 (BP Location: Right Wrist)   Pulse 88   Temp 98.6 F (37 C) (Oral)  Resp 20   Ht 5' 2" (1.575 m)   Wt (!) 145.5 kg   SpO2 93%   BMI 58.67 kg/m   Intake/Output Summary (Last 24 hours) at 08/01/2019 1102 Last data filed at 08/01/2019 1023 Gross per 24 hour  Intake 866 ml  Output 1900 ml  Net -1034 ml   Filed Weights   07/30/19 0501 07/31/19 0500 08/01/19 0630  Weight: (!) 150.7 kg (!) 147.6 kg (!) 145.5 kg    Exam: Patient is examined daily including today on 08/01/2019, exams remain the same as of yesterday except that has changed    General:  NAD, pleasant  Cardiovascular: RRR  Respiratory: CTABL  Abdomen: Soft/ND/NT, positive BS  Musculoskeletal: Bilateral lower extremity edema, erythema see pic below  Neuro: alert, oriented           Data Reviewed: Basic Metabolic Panel: Recent Labs   Lab 07/28/19 0411 07/29/19 0409 07/30/19 0257 07/31/19 0448 08/01/19 0551  NA 134* 137 136 136 134*  K 4.0 3.7 3.9 3.5 3.5  CL 96* 91* 92* 89* 89*  CO2 28 32 _0 GLUCOSE 83 90 102* 98 102*  BUN _1 CREATININE 0.63 0.78 0.67 0.65 0.67  CALCIUM 8.5* 9.0 9.3 9.5 9.4  MG  --   --   --   --  1.9   Liver Function Tests: Recent Labs  Lab 07/26/19 1219  AST 22  ALT 15  ALKPHOS 113  BILITOT 0.4  PROT 6.4*  ALBUMIN 2.8*   No results for input(s): LIPASE, AMYLASE in the last 168 hours. No results for input(s): AMMONIA in the last 168 hours. CBC: Recent Labs  Lab 07/26/19 1219 07/26/19 1248 07/27/19 0302  WBC 10.8*  --  6.8  NEUTROABS 8.9*  --   --   HGB 10.5* 11.6* 10.7*  HCT 33.3* 34.0* 33.6*  MCV 86.3  --  84.0  PLT 294  --  284   Cardiac Enzymes:   No results for input(s): CKTOTAL, CKMB, CKMBINDEX, TROPONINI in the last 168 hours. BNP (last 3 results) No results for input(s): BNP in the last 8760 hours.  ProBNP (last 3 results) No results for input(s): PROBNP in the last 8760 hours.  CBG: No results for input(s): GLUCAP in the last 168 hours.  Recent Results (from the past 240 hour(s))  Blood Culture (routine x 2)     Status: None   Collection Time: 07/26/19 12:36 PM   Specimen: BLOOD LEFT HAND  Result Value Ref Range Status   Specimen Description BLOOD LEFT HAND  Final   Special Requests   Final    Blood Culture results may not be optimal due to an inadequate volume of blood received in culture bottles   Culture   Final    NO GROWTH 5 DAYS Performed at [facility], [place] [address], [place], [place] [address]    Report Status 07/31/2019 FINAL  Final  SARS CORONAVIRUS 2 (TAT 6-24 HRS) Nasopharyngeal Nasopharyngeal Swab     Status: None   Collection Time: 07/26/19  2:03 PM   Specimen: Nasopharyngeal Swab  Result Value Ref Range Status   SARS Coronavirus 2 NEGATIVE NEGATIVE Final    Comment: (NOTE) SARS-CoV-2 target nucleic acids  are NOT DETECTED. The SARS-CoV-2 RNA is generally detectable in upper and lower respiratory specimens during the acute phase of infection. Negative results do not preclude SARS-CoV-2 infection, do not rule out co-infections with other pathogens, and should  not be used as the sole basis for treatment or other patient management decisions. Negative results must be combined with clinical observations, patient history, and epidemiological information. The expected result is Negative. Fact Sheet for Patients: SugarRoll.be Fact Sheet for Healthcare Providers: https://www.woods-mathews.com/ This test is not yet approved or cleared by the [place] FDA and  has been authorized for detection and/or diagnosis of SARS-CoV-2 by FDA under an Emergency Use Authorization (EUA). This EUA will remain  in effect (meaning this test can be used) for the duration of the COVID-19 declaration under Section 56 4(b)(1) of the Act, 21 U.S.C. section 360bbb-3(b)(1), unless the authorization is terminated or revoked sooner. Performed at [facility], [place] [address] [place], [place] [address]   Urine culture     Status: None   Collection Time: 07/26/19  2:58 PM   Specimen: In/Out Cath Urine  Result Value Ref Range Status   Specimen Description IN/OUT CATH URINE  Final   Special Requests NONE  Final   Culture   Final    NO GROWTH Performed at [facility], [place] [address] [place], [place] [address]    Report Status 07/27/2019 FINAL  Final  MRSA PCR Screening     Status: Abnormal   Collection Time: 07/26/19  6:40 PM   Specimen: Nasopharyngeal  Result Value Ref Range Status   MRSA by PCR POSITIVE (A) NEGATIVE Final    Comment:        The GeneXpert MRSA Assay (FDA approved for NASAL specimens only), is one component of a comprehensive MRSA colonization surveillance program. It is not intended to diagnose MRSA infection nor to guide or monitor  treatment for MRSA infections. RESULT CALLED TO, READ BACK BY AND VERIFIED WITH: Sherri Rad RN 07/26/19 2029 JDW Performed at [place] [address] [place],  [address]      Studies: No results found.  Scheduled Meds: . buPROPion  150 mg Oral Daily  . busPIRone  15 mg Oral BID  . clobetasol cream  1 application Topical BID  . clonazePAM  1 mg Oral QHS  . collagenase   Topical Daily  . DULoxetine  60 mg Oral Daily  . enoxaparin (LOVENOX) injection  70 mg Subcutaneous Q24H  . furosemide  80 mg Intravenous BID  . gabapentin  1,200 mg Oral TID  . irbesartan  75 mg Oral Daily  . nebivolol  10 mg Oral Daily  . nystatin  1 application Topical QID  . Oxcarbazepine  300 mg Oral BID  . pantoprazole  40 mg Oral BID  . pentoxifylline  400 mg Oral TID WC  . potassium chloride  40 mEq Oral Once  . rosuvastatin  20 mg Oral QPM  . senna-docusate  1 tablet Oral BID  . sodium chloride  1 g Oral BID WC    Continuous Infusions: . cefTRIAXone (ROCEPHIN)  IV 2 g (07/31/19 0932)  . vancomycin       Time spent: 56mns I have personally reviewed and interpreted on  08/01/2019 daily labs, tele strips, imagings as discussed above under date review session and assessment and plans.  I reviewed all nursing notes, pharmacy notes, consultant notes,  vitals, pertinent old records  I have discussed plan of care as described above with RN , patient  on 08/01/2019   FFlorencia ReasonsMD, PhD, FACP  [facility]  Available via Epic secure chat 7am-7pm for nonurgent issues Please page for urgent issues, pager number available  through [email or web address] .   08/01/2019, 11:02 AM  LOS: 6 days

## 2019-08-02 DIAGNOSIS — I5033 Acute on chronic diastolic (congestive) heart failure: Secondary | ICD-10-CM

## 2019-08-02 LAB — CBC WITH DIFFERENTIAL/PLATELET
Abs Immature Granulocytes: 0.11 10*3/uL — ABNORMAL HIGH (ref 0.00–0.07)
Basophils Absolute: 0.1 10*3/uL (ref 0.0–0.1)
Basophils Relative: 1 %
Eosinophils Absolute: 0.5 10*3/uL (ref 0.0–0.5)
Eosinophils Relative: 6 %
HCT: 43 % (ref 36.0–46.0)
Hemoglobin: 13.4 g/dL (ref 12.0–15.0)
Immature Granulocytes: 1 %
Lymphocytes Relative: 20 %
Lymphs Abs: 1.8 10*3/uL (ref 0.7–4.0)
MCH: 26.9 pg (ref 26.0–34.0)
MCHC: 31.2 g/dL (ref 30.0–36.0)
MCV: 86.3 fL (ref 80.0–100.0)
Monocytes Absolute: 1.1 10*3/uL — ABNORMAL HIGH (ref 0.1–1.0)
Monocytes Relative: 13 %
Neutro Abs: 5.1 10*3/uL (ref 1.7–7.7)
Neutrophils Relative %: 59 %
Platelets: 400 10*3/uL (ref 150–400)
RBC: 4.98 MIL/uL (ref 3.87–5.11)
RDW: 15.9 % — ABNORMAL HIGH (ref 11.5–15.5)
WBC: 8.8 10*3/uL (ref 4.0–10.5)
nRBC: 0 % (ref 0.0–0.2)

## 2019-08-02 LAB — BASIC METABOLIC PANEL
Anion gap: 15 (ref 5–15)
BUN: 22 mg/dL — ABNORMAL HIGH (ref 6–20)
CO2: 28 mmol/L (ref 22–32)
Calcium: 9.4 mg/dL (ref 8.9–10.3)
Chloride: 93 mmol/L — ABNORMAL LOW (ref 98–111)
Creatinine, Ser: 0.69 mg/dL (ref 0.44–1.00)
GFR calc Af Amer: >60 mL/min (ref >60)
GFR calc non Af Amer: >60 mL/min (ref >60)
Glucose, Bld: 93 mg/dL (ref 70–99)
Potassium: 3.5 mmol/L (ref 3.5–5.1)
Sodium: 136 mmol/L (ref 135–145)

## 2019-08-02 LAB — MAGNESIUM: Magnesium: 2 mg/dL (ref 1.7–2.4)

## 2019-08-02 MED ORDER — POTASSIUM CHLORIDE CRYS ER 20 MEQ PO TBCR
40.0000 meq | EXTENDED_RELEASE_TABLET | Freq: Once | ORAL | Status: AC
  Administered 2019-08-02: 40 meq via ORAL
  Filled 2019-08-02: qty 2

## 2019-08-02 MED ORDER — CLOBETASOL PROPIONATE 0.05 % EX CREA: 1.0000 "application " | TOPICAL_CREAM | Freq: Two times a day (BID) | CUTANEOUS | 0 refills | Status: DC

## 2019-08-02 MED ORDER — FUROSEMIDE 80 MG PO TABS: 80.0000 mg | ORAL_TABLET | Freq: Every day | ORAL | 0 refills | Status: DC

## 2019-08-02 MED ORDER — COLLAGENASE 250 UNIT/GM EX OINT: TOPICAL_OINTMENT | Freq: Every day | CUTANEOUS | 0 refills | Status: DC

## 2019-08-02 MED ORDER — SENNOSIDES-DOCUSATE SODIUM 8.6-50 MG PO TABS: 1.0000 | ORAL_TABLET | Freq: Every day | ORAL | 0 refills | Status: DC

## 2019-08-02 MED ORDER — IRBESARTAN 75 MG PO TABS: 75.0000 mg | ORAL_TABLET | Freq: Every day | ORAL | 0 refills | Status: DC

## 2019-08-02 NOTE — Progress Notes (Signed)
Pt's legs were dressed bilaterally prior to d/c. Pt and family member who was at bedside were educated step by step on how to apply prescribed ointments and the proper way to dress bilateral legs. Pt and support person stated they felt would be able to complete dressing changes at home.

## 2019-08-02 NOTE — Plan of Care (Signed)
°  Problem: Education: °Goal: Knowledge of General Education information will improve °Description: Including pain rating scale, medication(s)/side effects and non-pharmacologic comfort measures °Outcome: Progressing °  °Problem: Health Behavior/Discharge Planning: °Goal: Ability to manage health-related needs will improve °Outcome: Progressing °  °Problem: Nutrition: °Goal: Adequate nutrition will be maintained °Outcome: Progressing °  °Problem: Coping: °Goal: Level of anxiety will decrease °Outcome: Progressing °  °Problem: Elimination: °Goal: Will not experience complications related to bowel motility °Outcome: Progressing °Goal: Will not experience complications related to urinary retention °Outcome: Progressing °  °Problem: Pain Managment: °Goal: General experience of comfort will improve °Outcome: Progressing °  °Problem: Safety: °Goal: Ability to remain free from injury will improve °Outcome: Progressing °  °Problem: Skin Integrity: °Goal: Risk for impaired skin integrity will decrease °Outcome: Progressing °  °

## 2019-08-02 NOTE — TOC Transition Note (Signed)
Transition of Care [facility]) - CM/SW Discharge Note   Patient Details  Name: [name] MRN: [id number] Date of Birth: 1971-01-19  Transition of Care [facility]) CM/SW Contact:  Arvella Merles, LCSW Phone Number: 08/02/2019, 12:22 PM   Clinical Narrative:    CSW received consult for bedside commode upon discharge. CSW spoke with [employer] and was informed patient has a $103 balance for another piece of equipment provided, which would need to be paid prior to receiving bedside commode.  CSW met bedside with patient and informed her of the outstanding balance. Patient expressed she is able to settle the balance and requested she be reached on her cell phone,  CSW provided [employer] with the information, to proceed with having bedside commode issued to patient. [name] has been set up for [facility]. Patient's address and phone number have been verified.  CSW will sign off, as no further social work interventions are needed. Please consult [place] again if new needs arise.     Final next level of care: [place] Barriers to Discharge: No Barriers Identified   Patient Goals and CMS Choice Patient states their goals for this hospitalization and ongoing recovery are:: To get stronger CMS Medicare.gov Compare Post Acute Care list provided to:: Patient Choice offered to / list presented to : Patient  Discharge Placement                      Discharge Plan and Services In-house Referral: Clinical Social Work   Post Acute Care Choice: Home Health          DME Arranged: Bedside commode DME Agency: [employer] Date DME Agency Contacted: 08/02/19 Time DME Agency Contacted: 1200 Representative spoke with at DME Agency: [place]: RN, PT [place] Agency: [place] Date [place]: 08/01/19 Time [place]: 0215 Representative spoke with at [place]: [place] ([place]) Interventions     Readmission Risk Interventions No  flowsheet data found.

## 2019-08-02 NOTE — Discharge Summary (Signed)
Discharge Summary  [name] [id number] DOB: 03/23/1971  PCP: [name], MD  Admit date: 07/26/2019 Discharge date: 08/02/2019  Time spent: 25mins, more than 50% time spent on coordination of care.  Recommendations for Outpatient Follow-up:  1. F/u with PCP within a week  for hospital discharge follow up, repeat cbc/bmp at follow up.  2. Home health RN for wound care/heart failure management, home health PT and equipment arranged 3. Wound care center info provided as well 4. Patient is instructed to perform daily weight, bring record for PCP to review, weight at discharge is 323lb.  Discharge Diagnoses:  Active Hospital Problems   Diagnosis Date Noted  . Hyponatremia 07/27/2019  . Sepsis ([place]) 07/26/2019  . PAN (polyarteritis nodosa) ([place]) 11/24/2015  . Cellulitis 01/03/2007  . Morbid obesity ([place]) 01/03/2007  . Anxiety and depression 06/28/2006    Resolved Hospital Problems  No resolved problems to display.    Discharge Condition: stable  Diet recommendation: heart healthy  Filed Weights   07/31/19 0500 08/01/19 0630 08/02/19 0500  Weight: (!) 147.6 kg (!) 145.5 kg (!) 146.8 kg    History of present illness: (Per admitting MD Dr. Roosevelt Locks) Chief Complaint: Leg pain and rash and fever  HPI: [name] is a 49 y.o. female with medical history significant Polyarteritis Nodosa, HTN, depression, came with leg pain/redness/swelling and fever. Pt has chronic leg discoloration and occasional ulcers, but seldom infections. She has been following with specialist at [facility] and in the past she failed steroid and Humira. From late last year she was started on [place], which she responded well and she is to get the 3rd dose next week. Patient states for the past week, she has had worsening rash bilateral lower legs and more swollen, painful. She reported that this looked like another episode of Polyarteritis Nodosa flare up, which usually went away in 2-3 days, but not this  time. She has felt fevers and chills since last night. Family reported that pt became confused and forgetful last night and this morning and sent her to ER. She denies cough, chest pain, shortness breath, nausea, vomiting, dumping, urinary symptoms, no diarrhea.   ED Course: Code sepsis called. WBC borderline elevated, Lactic acid WNL. BP stable.   Hospital Course:  Active Problems:   Morbid obesity ([place])   Anxiety and depression   Cellulitis   PAN (polyarteritis nodosa) ([facility])   Sepsis ([place])   Hyponatremia   Sepsis due to bilateral lower extremity cellulitis/possible venous ulcer: X-ray of bilateral foot no osteomyelitis Bilateral Legs/inner thighs with open wound and  purulent draining initially MRSA screen positive Blood culture no growth  on IV vancomycin and cefepime initially , then IV vancomycin and Rocephin, improving, fever resolved, leukocytosis resolved,   she has history of multiple oral antibiotic allergy, she finished total of 7 days iv abx treatment in the hospital. Appreciate wound care "soap water and pat to dry and apply Santyl to scabs, Cover both legs with Colbetasol cream. Cover with NS moist gauze. Wrap with kerlix from below toes to below knee. Secure with ace wrap. Change daily.   home health RN for wound care. Local wound care center information also provided to the patient   Acute on chronic diastolic CHF exacerbation 2D echo done on 07/27/2019 showed: EF 60 to 65%. The left ventricle has normal function. The left ventricle has no regional wall motion abnormalities. There is moderate left ventricular hypertrophy.  She is treated with IV Lasix 80 mg twice  a day Edema has much improved, weight down, weight 323 pounds at discharge She is discharged on oral Lasix 80 mg daily, take extra dose if weight increases 5 pounds, she is instructed to do daily weight, bring record for PCP to review Home health RN for heart failure management  arranged.  Hypervolemic hyponatremia: Sodium 122 on presentation Sodium normalized with IV Lasix. Repeat BMP at hospital discharge follow-up   Essential hypertension: Blood pressure stable on Bystolic ,Avapro and Lasix .   Normocytic anemia Hemoglobin stable above 10  History of polyarthritis nodosa/immunosuppressed status Getting Skyrizi injection every 3 months, Followed at [facility] rheumatology   Anxiety and depression Continue Wellbutrin BuSpar and Klonopin discontinue Valium. Polypharmacy, follow-up with PCP.  Taper if able.  Class III obesity/[name] on cpap Declined CPAP at the hospital Body mass index is 58.67 kg/m. Lifestyle changes  DVT Prophylaxis while in the hospital: Lovenox   Code Status: full  Family Communication: patient   Disposition Plan:    Patient came from:home  d/c place:home with home health    Consultants:  Wound care  Physical therapy   Case management  Procedures:  None  Antibiotics:  Vanco cefepime, then Vanco Rocephin   Discharge Exam: BP (!) 146/80 (BP Location: Right Wrist)   Pulse 67   Temp 98.3 F (36.8 C) (Oral)   Resp (!) 22   Ht 5\' 2"  (1.575 m)   Wt (!) 146.8 kg   SpO2 93%   BMI 59.19 kg/m    General:  NAD, pleasant  Cardiovascular: RRR  Respiratory: CTABL  Abdomen: Soft/ND/NT, positive BS  Musculoskeletal: Bilateral lower extremity edema, erythema has improved, see pic below  Neuro: alert, oriented          Discharge Instructions You were cared for by a hospitalist during your hospital stay. If you have any questions about your discharge medications or the care you received while you were in the hospital after you are discharged, you can call the unit and asked to speak with the hospitalist on call if the hospitalist that took care of you is not available. Once you are  discharged, your primary care physician will handle any further medical issues. Please note that NO REFILLS for any discharge medications will be authorized once you are discharged, as it is imperative that you return to your primary care physician (or establish a relationship with a primary care physician if you do not have one) for your aftercare needs so that they can reassess your need for medications and monitor your lab values.  Discharge Instructions    Diet - low sodium heart healthy   Complete by: As directed    Discharge instructions   Complete by: As directed    Leg wound care instructions: soap water and pat to dry and apply Santyl to scabs, Cover both legs with Colbetasol cream.  Cover with NS moist gauze.  Wrap with kerlix from below toes to below knee.  Secure with ace wrap.  Change daily.   Discharge instructions   Complete by: As directed    Please weight yourself daily, please call your doctor if your weight start to go up. Your weight at discharge is 323lb. Please bring your weight record for your doctor to review at hospital discharge follow-up.   Increase activity slowly   Complete by: As directed      Allergies as of 08/02/2019      Reactions   Niacin Anaphylaxis, Swelling, Other (See Comments)   REACTION: Swelling, problems  breathing   Sulfamethoxazole-trimethoprim Anaphylaxis, Swelling, Rash, Other (See Comments)   REACTION: Rash, throat closed, eyes swelled.   Aspirin Hives   Bactrim Hives   Benzoin Compound Rash   Cephalexin Hives   Tolerated ceftriaxone and cefepime 07/2019   Clindamycin/lincomycin Hives   Doxycycline Hives   Iohexol Hives   Pt treated with PO benedryl   Lisinopril Hives   Naproxen Hives   Pnu-imune [pneumococcal Polysaccharide Vaccine] Rash   Sulfonamide Derivatives Rash      Medication List    STOP taking these medications   clotrimazole-betamethasone cream Commonly known as: Lotrisone   diazepam 5 MG tablet Commonly known as:  VALIUM   halobetasol 0.05 % cream Commonly known as: ULTRAVATE     TAKE these medications   ARIPiprazole 10 MG tablet Commonly known as: ABILIFY Take 10 mg by mouth daily.   Armodafinil 250 MG tablet Take 250 mg by mouth daily.   Biotin 10 MG Tabs Take 10 mg by mouth daily.   buPROPion 150 MG 24 hr tablet Commonly known as: WELLBUTRIN XL TAKE 1 TABLET BY MOUTH DAILY   busPIRone 30 MG tablet Commonly known as: BUSPAR TAKE 1 TABLET BY MOUTH 2 TIMES DAILY   Bystolic 10 MG tablet Generic drug: nebivolol TAKE 1 TABLET BY MOUTH EVERY DAY What changed:   how much to take  additional instructions   calcium citrate 950 (200 Ca) MG tablet Commonly known as: CALCITRATE - dosed in mg elemental calcium Take 1 tablet by mouth daily.   clobetasol cream 0.05 % Commonly known as: TEMOVATE Apply 1 application topically 2 (two) times daily. Cover both legs with Colbetasol cream   clonazePAM 1 MG tablet Commonly known as: KLONOPIN TAKE 1 TABLET BY MOUTH EVERY DAY What changed: when to take this   collagenase ointment Commonly known as: SANTYL Apply topically daily. apply Santyl to scabs on legs Start taking on: August 03, 2019   cyclobenzaprine 10 MG tablet Commonly known as: FLEXERIL TAKE 1 TABLET BY MOUTH 3 TIMES DAILY AS NEEDED What changed: reasons to take this   DULoxetine 60 MG capsule Commonly known as: CYMBALTA Take 2 capsules (120 mg total) by mouth daily.   eletriptan 40 MG tablet Commonly known as: Relpax Take 1 tablet (40 mg total) by mouth as needed for migraine. may repeat in 2 hours if necessary   FeroSul 325 (65 FE) MG tablet Generic drug: ferrous sulfate TAKE 1 TABLET BY MOUTH EVERY DAY WITH BREAKFAST What changed: See the new instructions.   furosemide 80 MG tablet Commonly known as: Lasix Take 1 tablet (80 mg total) by mouth daily. Take one extra tablet if weight increase by 5lbs and call your doctor for further instruction. What changed:    medication strength  how much to take  how to take this  when to take this  additional instructions   gabapentin 600 MG tablet Commonly known as: NEURONTIN Take 1,200 mg by mouth 3 (three) times daily.   HYDROcodone-acetaminophen 10-325 MG tablet Commonly known as: NORCO Take 1 tablet by mouth every 6 (six) hours as needed. What changed: reasons to take this   irbesartan 75 MG tablet Commonly known as: AVAPRO Take 1 tablet (75 mg total) by mouth daily. Start taking on: August 03, 2019   meclizine 25 MG tablet Commonly known as: ANTIVERT Take 1 tablet (25 mg total) by mouth 3 (three) times daily as needed for dizziness.   nystatin powder Generic drug: nystatin APPLY 4 grams TO THE  AFFECTED AREA 4 TIMES DAILY What changed: See the new instructions.   Oxcarbazepine 300 MG tablet Commonly known as: TRILEPTAL Take 300 mg by mouth 2 (two) times daily.   pantoprazole 40 MG tablet Commonly known as: PROTONIX TAKE 1 TABLET BY MOUTH 2 TIMES DAILY   pentoxifylline 400 MG CR tablet Commonly known as: TRENTAL Take 400 mg by mouth 3 (three) times daily with meals.   promethazine 25 MG tablet Commonly known as: PHENERGAN TAKE 1 TABLET BY MOUTH EVERY 6 HOURS AS NEEDED FOR NAUSEA AND VOMITING What changed:   how much to take  how to take this  when to take this  reasons to take this  additional instructions   rosuvastatin 20 MG tablet Commonly known as: CRESTOR TAKE 1 TABLET BY MOUTH EVERY DAY What changed: when to take this   senna-docusate 8.6-50 MG tablet Commonly known as: Senokot-S Take 1 tablet by mouth at bedtime.   Skyrizi (150 MG Dose) 75 MG/0.83ML Pskt Generic drug: Risankizumab-rzaa(150 MG Dose) Inject 150 mg into the skin every 3 (three) months.            Durable Medical Equipment  (From admission, onward)         Start     Ordered   08/02/19 1204  For home use only DME 3 n 1  Once    Comments: Bariatric bedside commode   08/02/19  1205         Allergies  Allergen Reactions  . Niacin Anaphylaxis, Swelling and Other (See Comments)    REACTION: Swelling, problems breathing  . Sulfamethoxazole-Trimethoprim Anaphylaxis, Swelling, Rash and Other (See Comments)    REACTION: Rash, throat closed, eyes swelled.  . Aspirin Hives  . Bactrim Hives  . Benzoin Compound Rash  . Cephalexin Hives    Tolerated ceftriaxone and cefepime 07/2019  . Clindamycin/Lincomycin Hives  . Doxycycline Hives  . Iohexol Hives    Pt treated with PO benedryl  . Lisinopril Hives  . Naproxen Hives  . Pnu-Imune [Pneumococcal Polysaccharide Vaccine] Rash  . Sulfonamide Derivatives Rash   Follow-up Information    [name], MD Follow up in 1 week(s).   Specialty: Family Medicine Why: hospital discharge follow up, repeat cbc/bmp at hospital discharge follow up Contact information: [address] [place] [address] [phone]        [facility]              Follow up.   Contact information: [address] [phone] [place], [facility] Follow up.   Specialty: Home Health Services Why: [name] representative will contact by telephone for follow-up. Contact information: [place] [address] [phone]            The results of significant diagnostics from this hospitalization (including imaging, microbiology, ancillary and laboratory) are listed below for reference.    Significant Diagnostic Studies: DG Chest Port 1 View  Result Date: 07/26/2019 CLINICAL DATA:  Fever. EXAM: PORTABLE CHEST 1 VIEW COMPARISON:  February 03, 2011. FINDINGS: The heart size and mediastinal contours are within normal limits. Both lungs are clear. The visualized skeletal structures are unremarkable. IMPRESSION: No active disease. Electronically Signed   By: Marijo Conception M.D.   On: 07/26/2019 12:55   DG  Tibia/Fibula Left Port  Result Date: 07/26/2019 CLINICAL DATA:  Weeping cellulitis. EXAM: PORTABLE LEFT TIBIA  AND FIBULA - 2 VIEW COMPARISON:  None. FINDINGS: Cortical margins of the tibia and fibula are intact. No periosteal reaction or bony destruction. No fracture. Osteoarthritis of the knee. Generalized subcutaneous soft tissue edema throughout the lower extremity. No soft tissue air or radiopaque foreign body. IMPRESSION: Generalized soft tissue edema. No acute osseous abnormality. No soft tissue air or radiopaque foreign body. Electronically Signed   By: Keith Rake M.D.   On: 07/26/2019 16:27   DG Tibia/Fibula Right Port  Result Date: 07/26/2019 CLINICAL DATA:  Weeping cellulitis. EXAM: PORTABLE RIGHT TIBIA AND FIBULA - 2 VIEW COMPARISON:  None. FINDINGS: Cortical margins of the tibia and fibula are intact. No periosteal reaction or bony destruction. No fracture. Osteoarthritis of the knee. The ankle is not included on the cross-table lateral view. Generalized subcutaneous soft tissue edema throughout the lower extremity. No soft tissue air or radiopaque foreign body. IMPRESSION: Generalized soft tissue edema without soft tissue air or radiopaque foreign body. No acute osseous abnormality. Electronically Signed   By: Keith Rake M.D.   On: 07/26/2019 16:28   ECHOCARDIOGRAM COMPLETE  Result Date: 07/27/2019    ECHOCARDIOGRAM REPORT   Patient Name:   ARPIL BLYDENBURGH Abruzzese Date of Exam: 07/27/2019 Medical Rec #:  [id number]    Height:       62.0 in Accession #:    [id number]   Weight:       361.5 lb Date of Birth:  09-03-1970    BSA:          2.459 m Patient Age:    75 years     BP:           108/67 mmHg Patient Gender: F            HR:           78 bpm. Exam Location:  Inpatient Procedure: 2D Echo, Cardiac Doppler and Color Doppler Indications:    R06.02 SOB  History:        Patient has prior history of Echocardiogram examinations, most                 recent 03/01/2011. Pulmonary HTN,  Signs/Symptoms:Dyspnea; Risk                 Factors:Sleep Apnea and Dyslipidemia.  Sonographer:    Roseanna Rainbow RDCS Referring Phys: [phone] [facility]  Sonographer Comments: Technically difficult study due to poor echo windows, suboptimal subcostal window, suboptimal apical window, suboptimal parasternal window and patient is morbidly obese. Image acquisition challenging due to patient body habitus. IMPRESSIONS  1. Left ventricular ejection fraction, by estimation, is 60 to 65%. The left ventricle has normal function. The left ventricle has no regional wall motion abnormalities. There is moderate left ventricular hypertrophy. Left ventricular diastolic parameters were normal.  2. Right ventricular systolic function is normal. The right ventricular size is normal. Tricuspid regurgitation signal is inadequate for assessing PA pressure.  3. The mitral valve is grossly normal. Trivial mitral valve regurgitation.  4. The aortic valve is tricuspid. Aortic valve regurgitation is not visualized.  5. The inferior vena cava is dilated in size with >50% respiratory variability, suggesting right atrial pressure of 8 mmHg. FINDINGS  Left Ventricle: Left ventricular ejection fraction, by estimation, is 60 to 65%. The left ventricle has normal function. The left ventricle has no regional wall motion abnormalities. The left ventricular internal cavity size was normal in size. There is  moderate left ventricular hypertrophy. Left ventricular diastolic parameters  were normal. Right Ventricle: The right ventricular size is normal. No increase in right ventricular wall thickness. Right ventricular systolic function is normal. Tricuspid regurgitation signal is inadequate for assessing PA pressure. Left Atrium: Left atrial size was normal in size. Right Atrium: Right atrial size was normal in size. Pericardium: There is no evidence of pericardial effusion. Mitral Valve: The mitral valve is grossly normal. Trivial mitral valve  regurgitation. Tricuspid Valve: The tricuspid valve is grossly normal. Tricuspid valve regurgitation is trivial. Aortic Valve: The aortic valve is tricuspid. Aortic valve regurgitation is not visualized. Pulmonic Valve: The pulmonic valve was grossly normal. Pulmonic valve regurgitation is not visualized. Aorta: The aortic root, ascending aorta, aortic arch and descending aorta are all structurally normal, with no evidence of dilitation or obstruction. Venous: The inferior vena cava is dilated in size with greater than 50% respiratory variability, suggesting right atrial pressure of 8 mmHg. IAS/Shunts: No atrial level shunt detected by color flow Doppler.  LEFT VENTRICLE PLAX 2D LVIDd:         4.30 cm      Diastology LVIDs:         3.00 cm      LV e' lateral:   11.20 cm/s LV PW:         1.30 cm      LV E/e' lateral: 8.6 LV IVS:        1.30 cm      LV e' medial:    10.40 cm/s LVOT diam:     2.00 cm      LV E/e' medial:  9.3 LV SV:         119 LV SV Index:   49 LVOT Area:     3.14 cm  LV Volumes (MOD) LV vol d, MOD A2C: 121.0 ml LV vol d, MOD A4C: 105.0 ml LV vol s, MOD A2C: 46.5 ml LV vol s, MOD A4C: 37.1 ml LV SV MOD A2C:     74.5 ml LV SV MOD A4C:     105.0 ml LV SV MOD BP:      70.3 ml RIGHT VENTRICLE             IVC RV S prime:     12.60 cm/s  IVC diam: 2.60 cm TAPSE (M-mode): 2.3 cm LEFT ATRIUM             Index       RIGHT ATRIUM           Index LA diam:        3.70 cm 1.50 cm/m  RA Area:     18.80 cm LA Vol (A2C):   64.6 ml 26.28 ml/m RA Volume:   54.40 ml  22.13 ml/m LA Vol (A4C):   58.0 ml 23.59 ml/m LA Biplane Vol: 65.3 ml 26.56 ml/m  AORTIC VALVE LVOT Vmax:   174.00 cm/s LVOT Vmean:  121.000 cm/s LVOT VTI:    0.380 m  AORTA Ao Root diam: 3.40 cm Ao Asc diam:  3.10 cm MITRAL VALVE MV Area (PHT): 4.31 cm    SHUNTS MV Decel Time: 176 msec    Systemic VTI:  0.38 m MV E velocity: 96.80 cm/s  Systemic Diam: 2.00 cm MV A velocity: 59.10 cm/s MV E/A ratio:  1.64 Lyman Bishop MD Electronically signed by  Lyman Bishop MD Signature Date/Time: 07/27/2019/12:41:55 PM    Final    VAS [place] LOWER EXTREMITY VENOUS (DVT)  Result Date: 07/27/2019  Lower Venous DVTStudy Indications: Edema.  Limitations: Body habitus and poor ultrasound/tissue interface. Comparison Study: previous 11/06/13 Performing Technologist: Abram Sander RVS  Examination Guidelines: A complete evaluation includes B-mode imaging, spectral Doppler, color Doppler, and power Doppler as needed of all accessible portions of each vessel. Bilateral testing is considered an integral part of a complete examination. Limited examinations for reoccurring indications may be performed as noted. The reflux portion of the exam is performed with the patient in reverse Trendelenburg.  +---------+---------------+---------+-----------+----------+--------------+ RIGHT    CompressibilityPhasicitySpontaneityPropertiesThrombus Aging +---------+---------------+---------+-----------+----------+--------------+ CFV      Full           Yes      Yes                                 +---------+---------------+---------+-----------+----------+--------------+ SFJ      Full                                                        +---------+---------------+---------+-----------+----------+--------------+ FV Prox  Full                                                        +---------+---------------+---------+-----------+----------+--------------+ FV Mid                  Yes      Yes                                 +---------+---------------+---------+-----------+----------+--------------+ FV Distal               Yes      Yes                                 +---------+---------------+---------+-----------+----------+--------------+ PFV      Full                                                        +---------+---------------+---------+-----------+----------+--------------+ POP      Full           Yes      Yes                                  +---------+---------------+---------+-----------+----------+--------------+ PTV      Full                                                        +---------+---------------+---------+-----------+----------+--------------+ PERO  Not visualized +---------+---------------+---------+-----------+----------+--------------+   +---------+---------------+---------+-----------+----------+--------------+ LEFT     CompressibilityPhasicitySpontaneityPropertiesThrombus Aging +---------+---------------+---------+-----------+----------+--------------+ CFV      Full           Yes      Yes                                 +---------+---------------+---------+-----------+----------+--------------+ SFJ      Full                                                        +---------+---------------+---------+-----------+----------+--------------+ FV Prox  Full                                                        +---------+---------------+---------+-----------+----------+--------------+ FV Mid                                                Not visualized +---------+---------------+---------+-----------+----------+--------------+ FV Distal                                             Not visualized +---------+---------------+---------+-----------+----------+--------------+ PFV      Full                                                        +---------+---------------+---------+-----------+----------+--------------+ POP      Full           Yes      Yes                                 +---------+---------------+---------+-----------+----------+--------------+ PTV                                                   Not visualized +---------+---------------+---------+-----------+----------+--------------+ PERO                                                  Not visualized  +---------+---------------+---------+-----------+----------+--------------+     Summary: RIGHT: - There is no evidence of deep vein thrombosis in the lower extremity. However, portions of this examination were limited- see technologist comments above.  - No cystic structure found in the popliteal fossa.  LEFT: - There is no evidence of deep vein thrombosis in the lower extremity. However, portions of this examination were limited- see technologist comments above.  - No cystic structure found in the popliteal fossa.  *  See table(s) above for measurements and observations. Electronically signed by Harold Barban MD on 07/27/2019 at 7:07:21 PM.    Final     Microbiology: Recent Results (from the past 240 hour(s))  Blood Culture (routine x 2)     Status: None   Collection Time: 07/26/19 12:30 PM   Specimen: BLOOD  Result Value Ref Range Status   Specimen Description BLOOD LEFT ANTECUBITAL  Final   Special Requests Blood Culture adequate volume  Final   Culture   Final    NO GROWTH 5 DAYS Performed at [facility], [address], [place], [place] [address]    Report Status 08/01/2019 FINAL  Final  Blood Culture (routine x 2)     Status: None   Collection Time: 07/26/19 12:36 PM   Specimen: BLOOD LEFT HAND  Result Value Ref Range Status   Specimen Description BLOOD LEFT HAND  Final   Special Requests   Final    Blood Culture results may not be optimal due to an inadequate volume of blood received in culture bottles   Culture   Final    NO GROWTH 5 DAYS Performed at [facility], [place] [address] [place], [place] [address]    Report Status 07/31/2019 FINAL  Final  SARS CORONAVIRUS 2 (TAT 6-24 HRS) Nasopharyngeal Nasopharyngeal Swab     Status: None   Collection Time: 07/26/19  2:03 PM   Specimen: Nasopharyngeal Swab  Result Value Ref Range Status   SARS Coronavirus 2 NEGATIVE NEGATIVE Final    Comment: (NOTE) SARS-CoV-2 target nucleic acids are NOT DETECTED. The SARS-CoV-2 RNA is  generally detectable in upper and lower respiratory specimens during the acute phase of infection. Negative results do not preclude SARS-CoV-2 infection, do not rule out co-infections with other pathogens, and should not be used as the sole basis for treatment or other patient management decisions. Negative results must be combined with clinical observations, patient history, and epidemiological information. The expected result is Negative. Fact Sheet for Patients: SugarRoll.be Fact Sheet for Healthcare Providers: https://www.woods-mathews.com/ This test is not yet approved or cleared by the [place] FDA and  has been authorized for detection and/or diagnosis of SARS-CoV-2 by FDA under an Emergency Use Authorization (EUA). This EUA will remain  in effect (meaning this test can be used) for the duration of the COVID-19 declaration under Section 56 4(b)(1) of the Act, 21 U.S.C. section 360bbb-3(b)(1), unless the authorization is terminated or revoked sooner. Performed at [facility], [place] [address] [place], [place] [address]   Urine culture     Status: None   Collection Time: 07/26/19  2:58 PM   Specimen: In/Out Cath Urine  Result Value Ref Range Status   Specimen Description IN/OUT CATH URINE  Final   Special Requests NONE  Final   Culture   Final    NO GROWTH Performed at [facility], [place] [address], [place], [place] [address]    Report Status 07/27/2019 FINAL  Final  MRSA PCR Screening     Status: Abnormal   Collection Time: 07/26/19  6:40 PM   Specimen: Nasopharyngeal  Result Value Ref Range Status   MRSA by PCR POSITIVE (A) NEGATIVE Final    Comment:        The GeneXpert MRSA Assay (FDA approved for NASAL specimens only), is one component of a comprehensive MRSA colonization surveillance program. It is not intended to diagnose MRSA infection nor to guide or monitor treatment for  MRSA infections. RESULT  CALLED TO, READ BACK BY AND VERIFIED WITH: Sherri Rad RN 07/26/19 2029 JDW Performed at [place] [address], [place], [place] [address]      Labs: Basic Metabolic Panel: Recent Labs  Lab 07/29/19 0409 07/30/19 0257 07/31/19 0448 08/01/19 0551 08/02/19 0209  NA 137 136 136 134* 136  K 3.7 3.9 3.5 3.5 3.5  CL 91* 92* 89* 89* 93*  CO2 32 31 29 30 28   GLUCOSE 90 102* 98 102* 93  BUN 9 10 14 19  22*  CREATININE 0.78 0.67 0.65 0.67 0.69  CALCIUM 9.0 9.3 9.5 9.4 9.4  MG  --   --   --  1.9 2.0   Liver Function Tests: Recent Labs  Lab 07/26/19 1219  AST 22  ALT 15  ALKPHOS 113  BILITOT 0.4  PROT 6.4*  ALBUMIN 2.8*   No results for input(s): LIPASE, AMYLASE in the last 168 hours. No results for input(s): AMMONIA in the last 168 hours. CBC: Recent Labs  Lab 07/26/19 1219 07/26/19 1248 07/27/19 0302 08/02/19 0209  WBC 10.8*  --  6.8 8.8  NEUTROABS 8.9*  --   --  5.1  HGB 10.5* 11.6* 10.7* 13.4  HCT 33.3* 34.0* 33.6* 43.0  MCV 86.3  --  84.0 86.3  PLT 294  --  284 400   Cardiac Enzymes: No results for input(s): CKTOTAL, CKMB, CKMBINDEX, TROPONINI in the last 168 hours. BNP: BNP (last 3 results) No results for input(s): BNP in the last 8760 hours.  ProBNP (last 3 results) No results for input(s): PROBNP in the last 8760 hours.  CBG: No results for input(s): GLUCAP in the last 168 hours.     Signed:  Florencia Reasons MD, PhD, FACP  [facility] 08/02/2019, 12:45 PM

## 2019-08-05 ENCOUNTER — Telehealth: Payer: Self-pay

## 2019-08-05 MED ORDER — PENTOXIFYLLINE ER 400 MG TABLET,EXTENDED RELEASE
ORAL_TABLET | Freq: Three times a day (TID) | ORAL | 2 refills | 30.00000 days | Status: CP
Start: 2019-08-05 — End: ?

## 2019-08-05 MED ORDER — BETAMETHASONE DIPROPIONATE 0.05 % TOPICAL OINTMENT
Freq: Two times a day (BID) | TOPICAL | 5 refills | 0.00000 days | Status: CP
Start: 2019-08-05 — End: 2020-08-04

## 2019-08-05 NOTE — Telephone Encounter (Signed)
Patient called in wanting to know what PCP thoughts are on her returning to [facility] for her recent diagnoses of Poly Nodosa Arthritis. States that she was receiving SKYRIZI injections for arthritis and psoriasis, which turned out to be cellulitis. States that she is anxious about getting another injection since the first few almost killed her. Also wants to know when PCP recommends she should sign up to receive COVID vaccine. Patient has VV scheduled with PCP on Thursday, wanting labs ordered as [facility], it is closer to her home. Please advise.

## 2019-08-06 DIAGNOSIS — M797 Fibromyalgia: Secondary | ICD-10-CM | POA: Diagnosis not present

## 2019-08-06 DIAGNOSIS — M300 Polyarteritis nodosa: Secondary | ICD-10-CM | POA: Diagnosis not present

## 2019-08-06 DIAGNOSIS — I509 Heart failure, unspecified: Secondary | ICD-10-CM | POA: Diagnosis not present

## 2019-08-06 DIAGNOSIS — I0981 Rheumatic heart failure: Secondary | ICD-10-CM | POA: Diagnosis not present

## 2019-08-06 DIAGNOSIS — I5033 Acute on chronic diastolic (congestive) heart failure: Secondary | ICD-10-CM | POA: Diagnosis not present

## 2019-08-06 DIAGNOSIS — I110 Hypertensive heart disease with heart failure: Secondary | ICD-10-CM | POA: Diagnosis not present

## 2019-08-06 DIAGNOSIS — I083 Combined rheumatic disorders of mitral, aortic and tricuspid valves: Secondary | ICD-10-CM | POA: Diagnosis not present

## 2019-08-06 DIAGNOSIS — M170 Bilateral primary osteoarthritis of knee: Secondary | ICD-10-CM | POA: Diagnosis not present

## 2019-08-06 DIAGNOSIS — L03116 Cellulitis of left lower limb: Secondary | ICD-10-CM | POA: Diagnosis not present

## 2019-08-06 DIAGNOSIS — L03115 Cellulitis of right lower limb: Secondary | ICD-10-CM | POA: Diagnosis not present

## 2019-08-06 NOTE — Unmapped (Signed)
PA denied for halobetasol, sent betamethasone as alternative

## 2019-08-06 NOTE — Telephone Encounter (Signed)
Patient notified of PCP recommendations and is agreement and expresses an understanding.  

## 2019-08-06 NOTE — Telephone Encounter (Signed)
She absolutely needs to tell [facility] about what happened so I recommend at least 1 more f/u and then based on how that appt goes she can decide if she feels comfortable with the plan going forward.  We can discuss her COVID vaccine at her f/u appt

## 2019-08-07 ENCOUNTER — Other Ambulatory Visit: Payer: Self-pay | Admitting: Family Medicine

## 2019-08-07 ENCOUNTER — Telehealth: Payer: Self-pay | Admitting: Emergency Medicine

## 2019-08-07 NOTE — Telephone Encounter (Signed)
FYILangley Gauss a nurse at [facility] called requesting verbal orders for continued wound care of the one open area at the back of Right calf. She has 1+edema. Needs Verbal orders Nursing and Physical Therapy Wanting to give education on keeping the areas/folds clean and dry Patient has been out of her Avapro due to pharmacy not having in stock but  Her blood pressures have been running normal.

## 2019-08-07 NOTE — Telephone Encounter (Signed)
Patient notified of PCP recommendations and is agreement and expresses an understanding.  

## 2019-08-07 NOTE — Telephone Encounter (Signed)
[place] for verbal orders and education No need to restart Avapro if BP is stable

## 2019-08-08 ENCOUNTER — Telehealth: Payer: Self-pay | Admitting: Emergency Medicine

## 2019-08-08 ENCOUNTER — Other Ambulatory Visit: Payer: Self-pay

## 2019-08-08 ENCOUNTER — Encounter: Payer: Self-pay | Admitting: Family Medicine

## 2019-08-08 ENCOUNTER — Telehealth (INDEPENDENT_AMBULATORY_CARE_PROVIDER_SITE_OTHER): Payer: Medicare HMO | Admitting: Family Medicine

## 2019-08-08 VITALS — BP 102/68 | HR 88 | Temp 98.4°F | Ht 62.5 in | Wt 324.2 lb

## 2019-08-08 DIAGNOSIS — I083 Combined rheumatic disorders of mitral, aortic and tricuspid valves: Secondary | ICD-10-CM | POA: Diagnosis not present

## 2019-08-08 DIAGNOSIS — M797 Fibromyalgia: Secondary | ICD-10-CM | POA: Diagnosis not present

## 2019-08-08 DIAGNOSIS — E871 Hypo-osmolality and hyponatremia: Secondary | ICD-10-CM | POA: Diagnosis not present

## 2019-08-08 DIAGNOSIS — L03119 Cellulitis of unspecified part of limb: Secondary | ICD-10-CM | POA: Diagnosis not present

## 2019-08-08 DIAGNOSIS — A419 Sepsis, unspecified organism: Secondary | ICD-10-CM | POA: Diagnosis not present

## 2019-08-08 DIAGNOSIS — I5033 Acute on chronic diastolic (congestive) heart failure: Secondary | ICD-10-CM | POA: Diagnosis not present

## 2019-08-08 DIAGNOSIS — R600 Localized edema: Secondary | ICD-10-CM | POA: Diagnosis not present

## 2019-08-08 DIAGNOSIS — L03116 Cellulitis of left lower limb: Secondary | ICD-10-CM | POA: Diagnosis not present

## 2019-08-08 DIAGNOSIS — M300 Polyarteritis nodosa: Secondary | ICD-10-CM | POA: Diagnosis not present

## 2019-08-08 DIAGNOSIS — L03115 Cellulitis of right lower limb: Secondary | ICD-10-CM | POA: Diagnosis not present

## 2019-08-08 DIAGNOSIS — I110 Hypertensive heart disease with heart failure: Secondary | ICD-10-CM | POA: Diagnosis not present

## 2019-08-08 DIAGNOSIS — I0981 Rheumatic heart failure: Secondary | ICD-10-CM | POA: Diagnosis not present

## 2019-08-08 DIAGNOSIS — M170 Bilateral primary osteoarthritis of knee: Secondary | ICD-10-CM | POA: Diagnosis not present

## 2019-08-08 NOTE — Progress Notes (Signed)
Virtual Visit via Video   I connected with patient on 08/08/19 at 11:00 AM EDT by a video enabled telemedicine application and verified that I am speaking with the correct person using two identifiers.  Location patient: Home Location provider: Acupuncturist, Office Persons participating in the virtual visit: Patient, Provider, [place] (Jess B)  I discussed the limitations of evaluation and management by telemedicine and the availability of in person appointments. The patient expressed understanding and agreed to proceed.  Subjective:   HPI:   Hospital F/U- pt was admitted 3/12-3/19 for sepsis of bilateral lower extremities.  Was started on IV Vanc and Cefepime, then switched to IV Vanc and Rocephin.  Completed 7 days of IV abx prior to d/c.  Has wound care assisting her at home.  Pt reports no open wounds at this time.  At time of admission Na was 122 and she was volume overloaded.  Na improved w/ IV Lasix- lost 55 lbs.  Now on Lasix 80mg  daily.  Denies edema at this time.  BP is well controlled today on Bystolic, Avapro, and Lasix.  Pt now sees that she was neglecting herself in order to care for dad and plans to do a better job.  Pt denies CP, SOB, N/V, HAs, confusion, edema, or wounds at this time  Reviewed D/C summary, labs, progress notes.  ROS:   See pertinent positives and negatives per HPI.  Patient Active Problem List   Diagnosis Date Noted   Hyponatremia 07/27/2019   Sepsis ([place]) 07/26/2019   Urinary urgency 04/04/2018   Chronic narcotic use 09/07/2016   Acute blood loss anemia 01/14/2016   Multiple gastric ulcers    PAN (polyarteritis nodosa) ([place]) 11/24/2015   GERD (gastroesophageal reflux disease) 01/08/2014   Routine general medical examination at a health care facility 04/23/2013   Bariatric surgery status 10/31/2012   Psoriasis 01/09/2012   DDD (degenerative disc disease), lumbar 11/30/2011   Migraine headache    [name] (obstructive sleep apnea)  11/16/2010   HYPERGLYCEMIA, FASTING 10/08/2009   CONTRACTURE OF TENDON 08/17/2009   PARESTHESIA, HANDS 12/23/2008   Leg pain, right 11/05/2008   ADVERSE DRUG REACTION 04/03/2008   PERIPHERAL EDEMA 03/26/2008   Morbid obesity ([place]) 01/03/2007   Cellulitis 01/03/2007   Hyperlipidemia 09/22/2006   Fibromyalgia 09/20/2006   Anxiety and depression 06/28/2006    Social History   Tobacco Use   Smoking status: Never Smoker   Smokeless tobacco: Never Used  Substance Use Topics   Alcohol use: No    Current Outpatient Medications:    ARIPiprazole (ABILIFY) 10 MG tablet, Take 10 mg by mouth daily., Disp: , Rfl:    Armodafinil 250 MG tablet, Take 250 mg by mouth daily. , Disp: , Rfl:    Biotin 10 MG TABS, Take 10 mg by mouth daily., Disp: , Rfl:    buPROPion (WELLBUTRIN XL) 150 MG 24 hr tablet, TAKE 1 TABLET BY MOUTH DAILY, Disp: 30 tablet, Rfl: 3   BYSTOLIC 10 MG tablet, TAKE 1 TABLET BY MOUTH EVERY DAY (Patient taking differently: Take 10 mg by mouth daily. BETA BLOCKER), Disp: 90 tablet, Rfl: 0   calcium citrate (CALCITRATE - DOSED IN MG ELEMENTAL CALCIUM) 950 MG tablet, Take 1 tablet by mouth daily., Disp: , Rfl:    clobetasol cream (TEMOVATE) [id number] %, Apply 1 application topically 2 (two) times daily. Cover both legs with Colbetasol cream, Disp: 30 g, Rfl: 0   clonazePAM (KLONOPIN) 1 MG tablet, TAKE 1 TABLET BY MOUTH EVERY DAY (  Patient taking differently: Take 1 mg by mouth at bedtime. ), Disp: 30 tablet, Rfl: 3   collagenase (SANTYL) ointment, Apply topically daily. apply Santyl to scabs on legs, Disp: 15 g, Rfl: 0   cyclobenzaprine (FLEXERIL) 10 MG tablet, TAKE 1 TABLET BY MOUTH 3 TIMES DAILY AS NEEDED (Patient taking differently: Take 10 mg by mouth 3 (three) times daily as needed for muscle spasms. ), Disp: 270 tablet, Rfl: 0   DULoxetine (CYMBALTA) 60 MG capsule, Take 2 capsules (120 mg total) by mouth daily., Disp: 240 capsule, Rfl: 2   eletriptan (RELPAX) 40  MG tablet, Take 1 tablet (40 mg total) by mouth as needed for migraine. may repeat in 2 hours if necessary, Disp: 15 tablet, Rfl: 3   FEROSUL 325 (65 Fe) MG tablet, TAKE 1 TABLET BY MOUTH EVERY DAY WITH BREAKFAST (Patient taking differently: Take 325 mg by mouth daily with breakfast. ), Disp: 30 tablet, Rfl: 6   furosemide (LASIX) 80 MG tablet, Take 1 tablet (80 mg total) by mouth daily. Take one extra tablet if weight increase by 5lbs and call your doctor for further instruction., Disp: 30 tablet, Rfl: 0   gabapentin (NEURONTIN) 600 MG tablet, Take 1,200 mg by mouth 3 (three) times daily., Disp: , Rfl:    halobetasol (ULTRAVATE) 0.05 % ointment, APPLY TOPICALLY 2 TIMES DAILY, Disp: , Rfl:    HYDROcodone-acetaminophen (NORCO) 10-325 MG tablet, Take 1 tablet by mouth every 6 (six) hours as needed. (Patient taking differently: Take 1 tablet by mouth every 6 (six) hours as needed for moderate pain. ), Disp: 90 tablet, Rfl: 0   irbesartan (AVAPRO) 75 MG tablet, Take 1 tablet (75 mg total) by mouth daily., Disp: 30 tablet, Rfl: 0   meclizine (ANTIVERT) 25 MG tablet, Take 1 tablet (25 mg total) by mouth 3 (three) times daily as needed for dizziness., Disp: 30 tablet, Rfl: 0   NYSTATIN powder, APPLY 4 grams TO THE AFFECTED AREA 4 TIMES DAILY (Patient taking differently: Apply 1 application topically 4 (four) times daily. ), Disp: 60 g, Rfl: 3   Oxcarbazepine (TRILEPTAL) 300 MG tablet, Take 300 mg by mouth 2 (two) times daily., Disp: , Rfl:    pantoprazole (PROTONIX) 40 MG tablet, TAKE 1 TABLET BY MOUTH 2 TIMES DAILY (Patient taking differently: Take 40 mg by mouth 2 (two) times daily. ), Disp: 180 tablet, Rfl: 1   pentoxifylline (TRENTAL) 400 MG CR tablet, Take 400 mg by mouth 3 (three) times daily with meals. , Disp: , Rfl: 11   promethazine (PHENERGAN) 25 MG tablet, TAKE 1 TABLET BY MOUTH EVERY 6 HOURS AS NEEDED FOR NAUSEA AND VOMITING, Disp: 45 tablet, Rfl: 3   rosuvastatin (CRESTOR) 20 MG  tablet, TAKE 1 TABLET BY MOUTH EVERY DAY (Patient taking differently: Take 20 mg by mouth every evening. ), Disp: 90 tablet, Rfl: 1   senna-docusate (SENOKOT-S) 8.6-50 MG tablet, Take 1 tablet by mouth at bedtime., Disp: 30 tablet, Rfl: 0   SKYRIZI, 150 MG DOSE, 75 MG/0.83ML PSKT, Inject 150 mg into the skin every 3 (three) months. , Disp: , Rfl:   Allergies  Allergen Reactions   Niacin Anaphylaxis, Swelling and Other (See Comments)    REACTION: Swelling, problems breathing   Sulfamethoxazole-Trimethoprim Anaphylaxis, Swelling, Rash and Other (See Comments)    REACTION: Rash, throat closed, eyes swelled.   Aspirin Hives   Bactrim Hives   Benzoin Compound Rash   Cephalexin Hives    Tolerated ceftriaxone and cefepime 07/2019  Clindamycin/Lincomycin Hives   Doxycycline Hives   Iohexol Hives    Pt treated with PO benedryl   Lisinopril Hives   Naproxen Hives   Pnu-Imune [Pneumococcal Polysaccharide Vaccine] Rash   Sulfonamide Derivatives Rash    Objective:   BP 102/68    Pulse 88    Temp 98.4 F (36.9 C) (Tympanic)    Ht 5' 2.5" (1.588 m)    Wt (!) 324 lb 3 oz (147.1 kg)    SpO2 96%    BMI 58.35 kg/m   AAOx3, NAD Obese NCAT, EOMI No obvious CN deficits Coloring WNL Pt is able to speak clearly, coherently without shortness of breath or increased work of breathing.  Thought process is linear.  Mood is appropriate.   Assessment and Plan:   Sepsis- resolved.  Pt completed 7 days of IV abx.  Feels 'a million percent' better.  She states she no longer has open wounds on her legs (which was the source of infxn)  Cellulitis- resolved.  Pt has [facility] wound care assessing her legs regularly  Edema- resolved since IV lasix and now daily PO lasix.  She is very pleased w/ how her legs look.  Will follow.  PAN- has upcoming appt w/ [facility] rheumatology.  Hyponatremia- resolved w/ diuresis.  Will repeat BMP to assess stability.   [name], MD 08/08/2019

## 2019-08-08 NOTE — Telephone Encounter (Signed)
Patient was calling back to get clarification of when she can get her COVID vaccine since she just started the [facility] medication today

## 2019-08-08 NOTE — Telephone Encounter (Signed)
She would need to ask rheumatology

## 2019-08-08 NOTE — Progress Notes (Signed)
I have discussed the procedure for the virtual visit with the patient who has given consent to proceed with assessment and treatment.   Kaimana Lurz L Jaamal Farooqui, CMA     

## 2019-08-09 ENCOUNTER — Telehealth: Payer: Self-pay | Admitting: Family Medicine

## 2019-08-09 DIAGNOSIS — I083 Combined rheumatic disorders of mitral, aortic and tricuspid valves: Secondary | ICD-10-CM | POA: Diagnosis not present

## 2019-08-09 DIAGNOSIS — I0981 Rheumatic heart failure: Secondary | ICD-10-CM | POA: Diagnosis not present

## 2019-08-09 DIAGNOSIS — I5033 Acute on chronic diastolic (congestive) heart failure: Secondary | ICD-10-CM | POA: Diagnosis not present

## 2019-08-09 DIAGNOSIS — M300 Polyarteritis nodosa: Secondary | ICD-10-CM | POA: Diagnosis not present

## 2019-08-09 DIAGNOSIS — M170 Bilateral primary osteoarthritis of knee: Secondary | ICD-10-CM | POA: Diagnosis not present

## 2019-08-09 DIAGNOSIS — L03115 Cellulitis of right lower limb: Secondary | ICD-10-CM | POA: Diagnosis not present

## 2019-08-09 DIAGNOSIS — I110 Hypertensive heart disease with heart failure: Secondary | ICD-10-CM | POA: Diagnosis not present

## 2019-08-09 DIAGNOSIS — M797 Fibromyalgia: Secondary | ICD-10-CM | POA: Diagnosis not present

## 2019-08-09 DIAGNOSIS — L03116 Cellulitis of left lower limb: Secondary | ICD-10-CM | POA: Diagnosis not present

## 2019-08-09 NOTE — Telephone Encounter (Signed)
Verbal ok given.

## 2019-08-09 NOTE — Telephone Encounter (Signed)
Patient notified of PCP recommendations and is agreement and expresses an understanding.  

## 2019-08-09 NOTE — Telephone Encounter (Signed)
[place]  1 week [place] [phone]

## 2019-08-13 DIAGNOSIS — M300 Polyarteritis nodosa: Secondary | ICD-10-CM | POA: Diagnosis not present

## 2019-08-13 DIAGNOSIS — L03115 Cellulitis of right lower limb: Secondary | ICD-10-CM | POA: Diagnosis not present

## 2019-08-13 DIAGNOSIS — M797 Fibromyalgia: Secondary | ICD-10-CM | POA: Diagnosis not present

## 2019-08-13 DIAGNOSIS — I110 Hypertensive heart disease with heart failure: Secondary | ICD-10-CM | POA: Diagnosis not present

## 2019-08-13 DIAGNOSIS — M170 Bilateral primary osteoarthritis of knee: Secondary | ICD-10-CM | POA: Diagnosis not present

## 2019-08-13 DIAGNOSIS — I5033 Acute on chronic diastolic (congestive) heart failure: Secondary | ICD-10-CM | POA: Diagnosis not present

## 2019-08-13 DIAGNOSIS — I083 Combined rheumatic disorders of mitral, aortic and tricuspid valves: Secondary | ICD-10-CM | POA: Diagnosis not present

## 2019-08-13 DIAGNOSIS — I0981 Rheumatic heart failure: Secondary | ICD-10-CM | POA: Diagnosis not present

## 2019-08-13 DIAGNOSIS — L03116 Cellulitis of left lower limb: Secondary | ICD-10-CM | POA: Diagnosis not present

## 2019-08-14 ENCOUNTER — Ambulatory Visit (INDEPENDENT_AMBULATORY_CARE_PROVIDER_SITE_OTHER): Payer: Medicare HMO

## 2019-08-14 ENCOUNTER — Other Ambulatory Visit: Payer: Self-pay

## 2019-08-14 ENCOUNTER — Telehealth: Payer: Self-pay | Admitting: Family Medicine

## 2019-08-14 DIAGNOSIS — L03119 Cellulitis of unspecified part of limb: Secondary | ICD-10-CM

## 2019-08-14 DIAGNOSIS — E871 Hypo-osmolality and hyponatremia: Secondary | ICD-10-CM | POA: Diagnosis not present

## 2019-08-14 DIAGNOSIS — A419 Sepsis, unspecified organism: Secondary | ICD-10-CM

## 2019-08-14 LAB — CBC WITH DIFFERENTIAL/PLATELET
Basophils Absolute: 0.1 10*3/uL (ref 0.0–0.1)
Basophils Relative: 1.1 % (ref 0.0–3.0)
Eosinophils Absolute: 0.3 10*3/uL (ref 0.0–0.7)
Eosinophils Relative: 4.9 % (ref 0.0–5.0)
HCT: 36 % (ref 36.0–46.0)
Hemoglobin: 11.8 g/dL — ABNORMAL LOW (ref 12.0–15.0)
Lymphocytes Relative: 30.3 % (ref 12.0–46.0)
Lymphs Abs: 1.6 10*3/uL (ref 0.7–4.0)
MCHC: 32.8 g/dL (ref 30.0–36.0)
MCV: 85 fl (ref 78.0–100.0)
Monocytes Absolute: 0.5 10*3/uL (ref 0.1–1.0)
Monocytes Relative: 9 % (ref 3.0–12.0)
Neutro Abs: 2.8 10*3/uL (ref 1.4–7.7)
Neutrophils Relative %: 54.7 % (ref 43.0–77.0)
Platelets: 259 10*3/uL (ref 150.0–400.0)
RBC: 4.23 Mil/uL (ref 3.87–5.11)
RDW: 16.9 % — ABNORMAL HIGH (ref 11.5–15.5)
WBC: 5.1 10*3/uL (ref 4.0–10.5)

## 2019-08-14 LAB — BASIC METABOLIC PANEL
BUN: 12 mg/dL (ref 6–23)
CO2: 32 mEq/L (ref 19–32)
Calcium: 9.1 mg/dL (ref 8.4–10.5)
Chloride: 96 mEq/L (ref 96–112)
Creatinine, Ser: 0.65 mg/dL (ref 0.40–1.20)
GFR: 96.6 mL/min (ref >60.00)
Glucose, Bld: 83 mg/dL (ref 70–99)
Potassium: 4.2 mEq/L (ref 3.5–5.1)
Sodium: 135 mEq/L (ref 135–145)

## 2019-08-14 NOTE — Unmapped (Signed)
[facility] Pharmacist Intervention    Type of intervention: Vaccine question    Medication: Skyrizi    Problem: Patient took a dose of Skyrizi recently and read about the need to avoid vaccines with this biologic. She is scheduled to get covid vaccine tomorrow.    Intervention: I advised the ACR has published guidelines that recommend it's fine for her to receive her COVID vaccine. No need to delay her covid vaccine, and no need to change her next dose of Skyrizi.     (https://www.rheumatology.org/Portals/0/Files/COVID-19-Vaccine-Clinical-Guidance-Rheumatic-Diseases-Summary.pdf)      Follow up needed: na    Approximate time spent: 5 minutes    Luisfernando Brightwell A Desiree Lucy Shared [facility] Special[facility] Pharmacist

## 2019-08-14 NOTE — Unmapped (Signed)
Hi Autumn Mcdonald,    Message received from patient who states that you all had discussed that it is OK to receive the COVID vaccine while on Skyrizi and not have to wait or hold treatment.  Pt states she recently went on their website and read where she needs to wait 14 days between the vaccine and Skyrizi.    She'd like to confirm what to do.    Tiff

## 2019-08-14 NOTE — Telephone Encounter (Signed)
I have placed a HH cert. And plan of care from [facility] in the bin upfront.

## 2019-08-14 NOTE — Telephone Encounter (Signed)
Document in your folder for review and signature

## 2019-08-15 NOTE — Unmapped (Signed)
Patient notified and verbalized understanding. 

## 2019-08-20 DIAGNOSIS — I083 Combined rheumatic disorders of mitral, aortic and tricuspid valves: Secondary | ICD-10-CM | POA: Diagnosis not present

## 2019-08-20 DIAGNOSIS — M170 Bilateral primary osteoarthritis of knee: Secondary | ICD-10-CM | POA: Diagnosis not present

## 2019-08-20 DIAGNOSIS — L03115 Cellulitis of right lower limb: Secondary | ICD-10-CM | POA: Diagnosis not present

## 2019-08-20 DIAGNOSIS — I5033 Acute on chronic diastolic (congestive) heart failure: Secondary | ICD-10-CM | POA: Diagnosis not present

## 2019-08-20 DIAGNOSIS — L03116 Cellulitis of left lower limb: Secondary | ICD-10-CM | POA: Diagnosis not present

## 2019-08-20 DIAGNOSIS — I110 Hypertensive heart disease with heart failure: Secondary | ICD-10-CM | POA: Diagnosis not present

## 2019-08-20 DIAGNOSIS — M300 Polyarteritis nodosa: Secondary | ICD-10-CM | POA: Diagnosis not present

## 2019-08-20 DIAGNOSIS — I0981 Rheumatic heart failure: Secondary | ICD-10-CM | POA: Diagnosis not present

## 2019-08-20 DIAGNOSIS — M797 Fibromyalgia: Secondary | ICD-10-CM | POA: Diagnosis not present

## 2019-08-20 NOTE — Unmapped (Signed)
Late cancellation due to recent discharge from hospital for cellulitis.  This encounter was created in error - please disregard.

## 2019-08-21 DIAGNOSIS — E871 Hypo-osmolality and hyponatremia: Secondary | ICD-10-CM

## 2019-08-21 DIAGNOSIS — Z6841 Body Mass Index (BMI) 40.0 and over, adult: Secondary | ICD-10-CM

## 2019-08-21 DIAGNOSIS — Z9181 History of falling: Secondary | ICD-10-CM

## 2019-08-21 DIAGNOSIS — G4733 Obstructive sleep apnea (adult) (pediatric): Secondary | ICD-10-CM

## 2019-08-21 DIAGNOSIS — F329 Major depressive disorder, single episode, unspecified: Secondary | ICD-10-CM

## 2019-08-21 DIAGNOSIS — G43909 Migraine, unspecified, not intractable, without status migrainosus: Secondary | ICD-10-CM

## 2019-08-21 DIAGNOSIS — J309 Allergic rhinitis, unspecified: Secondary | ICD-10-CM

## 2019-08-21 DIAGNOSIS — E785 Hyperlipidemia, unspecified: Secondary | ICD-10-CM

## 2019-08-21 DIAGNOSIS — I5033 Acute on chronic diastolic (congestive) heart failure: Secondary | ICD-10-CM | POA: Diagnosis not present

## 2019-08-21 DIAGNOSIS — I0981 Rheumatic heart failure: Secondary | ICD-10-CM | POA: Diagnosis not present

## 2019-08-21 DIAGNOSIS — Z79891 Long term (current) use of opiate analgesic: Secondary | ICD-10-CM

## 2019-08-21 DIAGNOSIS — M797 Fibromyalgia: Secondary | ICD-10-CM | POA: Diagnosis not present

## 2019-08-21 DIAGNOSIS — D649 Anemia, unspecified: Secondary | ICD-10-CM

## 2019-08-21 DIAGNOSIS — I083 Combined rheumatic disorders of mitral, aortic and tricuspid valves: Secondary | ICD-10-CM | POA: Diagnosis not present

## 2019-08-21 DIAGNOSIS — L03116 Cellulitis of left lower limb: Secondary | ICD-10-CM | POA: Diagnosis not present

## 2019-08-21 DIAGNOSIS — L03115 Cellulitis of right lower limb: Secondary | ICD-10-CM | POA: Diagnosis not present

## 2019-08-21 DIAGNOSIS — F419 Anxiety disorder, unspecified: Secondary | ICD-10-CM

## 2019-08-21 DIAGNOSIS — M170 Bilateral primary osteoarthritis of knee: Secondary | ICD-10-CM | POA: Diagnosis not present

## 2019-08-21 DIAGNOSIS — M300 Polyarteritis nodosa: Secondary | ICD-10-CM | POA: Diagnosis not present

## 2019-08-21 DIAGNOSIS — I110 Hypertensive heart disease with heart failure: Secondary | ICD-10-CM | POA: Diagnosis not present

## 2019-08-21 NOTE — Telephone Encounter (Signed)
Form completed and placed in basket  

## 2019-08-22 ENCOUNTER — Other Ambulatory Visit: Payer: Self-pay | Admitting: Family Medicine

## 2019-08-22 MED ORDER — HYDROCODONE-ACETAMINOPHEN 10-325 MG PO TABS: 1.0000 | ORAL_TABLET | Freq: Four times a day (QID) | ORAL | 0 refills | Status: DC | PRN

## 2019-08-22 NOTE — Telephone Encounter (Signed)
Last OV 08/08/19 Hydrocodone last filled 07/22/19 #90 with 0

## 2019-08-22 NOTE — Telephone Encounter (Signed)
FYI

## 2019-08-22 NOTE — Telephone Encounter (Signed)
Picked up from the back, faxed, and sent to scan  

## 2019-08-26 ENCOUNTER — Encounter: Payer: Self-pay | Admitting: Family Medicine

## 2019-08-26 ENCOUNTER — Other Ambulatory Visit: Payer: Self-pay | Admitting: Family Medicine

## 2019-08-26 MED ORDER — FUROSEMIDE 80 MG TABLET
0.00000 days
Start: 2019-08-26 — End: ?

## 2019-08-27 ENCOUNTER — Telehealth: Payer: Self-pay

## 2019-08-27 DIAGNOSIS — L03115 Cellulitis of right lower limb: Secondary | ICD-10-CM | POA: Diagnosis not present

## 2019-08-27 DIAGNOSIS — I0981 Rheumatic heart failure: Secondary | ICD-10-CM | POA: Diagnosis not present

## 2019-08-27 DIAGNOSIS — M170 Bilateral primary osteoarthritis of knee: Secondary | ICD-10-CM | POA: Diagnosis not present

## 2019-08-27 DIAGNOSIS — M797 Fibromyalgia: Secondary | ICD-10-CM | POA: Diagnosis not present

## 2019-08-27 DIAGNOSIS — I110 Hypertensive heart disease with heart failure: Secondary | ICD-10-CM | POA: Diagnosis not present

## 2019-08-27 DIAGNOSIS — I083 Combined rheumatic disorders of mitral, aortic and tricuspid valves: Secondary | ICD-10-CM | POA: Diagnosis not present

## 2019-08-27 DIAGNOSIS — M300 Polyarteritis nodosa: Secondary | ICD-10-CM | POA: Diagnosis not present

## 2019-08-27 DIAGNOSIS — I5033 Acute on chronic diastolic (congestive) heart failure: Secondary | ICD-10-CM | POA: Diagnosis not present

## 2019-08-27 DIAGNOSIS — L03116 Cellulitis of left lower limb: Secondary | ICD-10-CM | POA: Diagnosis not present

## 2019-08-27 NOTE — Telephone Encounter (Signed)
Herbert Deaner with [facility] called to inform that the patient is being d/c from home health. Patient is still awaiting compression hose. She reports a 6/10 right knee pain. Herbert Deaner states he thinks the patient would benefit from a referral to [place] for a knee brace consultation. Mr. Heber [place] can be reached at [phone].

## 2019-08-28 ENCOUNTER — Other Ambulatory Visit: Payer: Self-pay | Admitting: Family Medicine

## 2019-08-28 NOTE — Telephone Encounter (Signed)
Called and spoke with pt. She states that at this time she does not want to go through with either referral. She feels as though Clair Gulling went a little overboard and does not want to be fitted for any type of brace. If she changes her mind she will call back and get the ortho referral.

## 2019-08-28 NOTE — Telephone Encounter (Signed)
In regards to her knee pain, in order to know what type of brace is needed, she needs an orthopedic evaluation

## 2019-08-29 DIAGNOSIS — F3341 Major depressive disorder, recurrent, in partial remission: Secondary | ICD-10-CM | POA: Diagnosis not present

## 2019-08-29 DIAGNOSIS — F518 Other sleep disorders not due to a substance or known physiological condition: Secondary | ICD-10-CM | POA: Diagnosis not present

## 2019-08-30 ENCOUNTER — Encounter: Payer: Self-pay | Admitting: Family Medicine

## 2019-08-30 MED ORDER — AMOXICILLIN-POT CLAVULANATE 875-125 MG PO TABS: 1.0000 | ORAL_TABLET | Freq: Two times a day (BID) | ORAL | 0 refills | Status: DC

## 2019-09-02 ENCOUNTER — Telehealth: Payer: Self-pay | Admitting: Family Medicine

## 2019-09-02 DIAGNOSIS — G4733 Obstructive sleep apnea (adult) (pediatric): Secondary | ICD-10-CM | POA: Diagnosis not present

## 2019-09-02 NOTE — Telephone Encounter (Signed)
Please advise 

## 2019-09-02 NOTE — Telephone Encounter (Signed)
Patient is calling in this morning wanting Dr.[name] know that she does no think the the BUSPAR is not is helping, states she is still having anxiety and depression. Would like to know if there is anything else she can be prescribed.

## 2019-09-02 NOTE — Telephone Encounter (Signed)
Patient notified of PCP recommendations and is agreement and expresses an understanding.  

## 2019-09-02 NOTE — Telephone Encounter (Signed)
I'm so sorry that she is having a hard time.  Unfortunately there will be these days/anniversaries/memories/etc that are harder to deal with than other days.  I don't want to make a med change for 1 weekend of difficult symptoms since she was doing so much better before.  I would give it a few days/weeks to see if things naturally improve and if not, we can then make changes

## 2019-09-02 NOTE — Telephone Encounter (Signed)
Pt is taking the welbutrin and cymbalta. She was having a hard weekend and took the Buspar until she could get a hold of you. She found out her nephew passed away this weekend and yesterday wa her mom's birthday.

## 2019-09-02 NOTE — Telephone Encounter (Signed)
We had taken Buspar off her med list on 3/25 and I did not think she was taking it b/c at that time she said she didn't notice a difference and was feeling better.  I have that she's on Wellbutrin and Cymbalta.  Please clarify that this is correct

## 2019-09-05 ENCOUNTER — Encounter: Payer: Self-pay | Admitting: Family Medicine

## 2019-09-05 DIAGNOSIS — E876 Hypokalemia: Secondary | ICD-10-CM

## 2019-09-06 ENCOUNTER — Other Ambulatory Visit: Payer: Self-pay

## 2019-09-06 ENCOUNTER — Ambulatory Visit (INDEPENDENT_AMBULATORY_CARE_PROVIDER_SITE_OTHER): Payer: Medicare HMO

## 2019-09-06 DIAGNOSIS — E876 Hypokalemia: Secondary | ICD-10-CM | POA: Diagnosis not present

## 2019-09-06 LAB — BASIC METABOLIC PANEL
BUN: 8 mg/dL (ref 6–23)
CO2: 33 mEq/L — ABNORMAL HIGH (ref 19–32)
Calcium: 8.4 mg/dL (ref 8.4–10.5)
Chloride: 89 mEq/L — ABNORMAL LOW (ref 96–112)
Creatinine, Ser: 0.59 mg/dL (ref 0.40–1.20)
GFR: 108 mL/min (ref >60.00)
Glucose, Bld: 93 mg/dL (ref 70–99)
Potassium: 3.7 mEq/L (ref 3.5–5.1)
Sodium: 130 mEq/L — ABNORMAL LOW (ref 135–145)

## 2019-09-11 ENCOUNTER — Ambulatory Visit (INDEPENDENT_AMBULATORY_CARE_PROVIDER_SITE_OTHER): Payer: Medicare HMO

## 2019-09-11 DIAGNOSIS — Z0000 Encounter for general adult medical examination without abnormal findings: Secondary | ICD-10-CM

## 2019-09-11 NOTE — Patient Instructions (Signed)
[name] , Thank you for taking time to come for your Medicare Wellness Visit. I appreciate your ongoing commitment to your health goals. Please review the following plan we discussed and let me know if I can assist you in the future.   Screening recommendations/referrals: Colorectal Screening: starting at age 50  Mammogram: please consider for this year Bone Density: up to date; last 06/05/17   Vision and Dental Exams: Recommended annual ophthalmology exams for early detection of glaucoma and other disorders of the eye Recommended annual dental exams for proper oral hygiene  Vaccinations: Influenza vaccine: completed 01/18/19 Pneumococcal vaccine: up to date; last 01/10/18 Tdap vaccine: up to date; last 07/24/18  Shingles vaccine:  You may receive this vaccine at your local pharmacy. (see handout)  Starting at age 36  Covid vaccine: completed   Advanced directives:  I have provided a copy for you to complete at home and have notarized. Once this is complete please bring a copy in to our office so we can scan it into your chart.  Goals: Recommend to drink at least 6-8 8oz glasses of water per day and consume a balanced diet rich in fresh fruits and vegetables.   Next appointment: Please schedule your Annual Wellness Visit with your Nurse Health Advisor in one year.  Preventive Care 40-64 Years, Female Preventive care refers to lifestyle choices and visits with your health care provider that can promote health and wellness. What does preventive care include?  A yearly physical exam. This is also called an annual well check.  Dental exams once or twice a year.  Routine eye exams. Ask your health care provider how often you should have your eyes checked.  Personal lifestyle choices, including:  Daily care of your teeth and gums.  Regular physical activity.  Eating a healthy diet.  Avoiding tobacco and drug use.  Limiting alcohol use.  Practicing safe sex.  Taking low-dose  aspirin daily starting at age 13 if recommended by your health care provider.  Taking vitamin and mineral supplements as recommended by your health care provider. What happens during an annual well check? The services and screenings done by your health care provider during your annual well check will depend on your age, overall health, lifestyle risk factors, and family history of disease. Counseling  Your health care provider may ask you questions about your:  Alcohol use.  Tobacco use.  Drug use.  Emotional well-being.  Home and relationship well-being.  Sexual activity.  Eating habits.  Work and work Statistician.  Method of birth control.  Menstrual cycle.  Pregnancy history. Screening  You may have the following tests or measurements:  Height, weight, and BMI.  Blood pressure.  Lipid and cholesterol levels. These may be checked every 5 years, or more frequently if you are over 71 years old.  Skin check.  Lung cancer screening. You may have this screening every year starting at age 60 if you have a 30-pack-year history of smoking and currently smoke or have quit within the past 15 years.  Fecal occult blood test (FOBT) of the stool. You may have this test every year starting at age 25.  Flexible sigmoidoscopy or colonoscopy. You may have a sigmoidoscopy every 5 years or a colonoscopy every 10 years starting at age 67.  Hepatitis C blood test.  Hepatitis B blood test.  Sexually transmitted disease (STD) testing.  Diabetes screening. This is done by checking your blood sugar (glucose) after you have not eaten for a while (  fasting). You may have this done every 1-3 years.  Mammogram. This may be done every 1-2 years. Talk to your health care provider about when you should start having regular mammograms. This may depend on whether you have a family history of breast cancer.  BRCA-related cancer screening. This may be done if you have a family history of breast,  ovarian, tubal, or peritoneal cancers.  Pelvic exam and Pap test. This may be done every 3 years starting at age 23. Starting at age 62, this may be done every 5 years if you have a Pap test in combination with an HPV test.  Bone density scan. This is done to screen for osteoporosis. You may have this scan if you are at high risk for osteoporosis. Discuss your test results, treatment options, and if necessary, the need for more tests with your health care provider. Vaccines  Your health care provider may recommend certain vaccines, such as:  Influenza vaccine. This is recommended every year.  Tetanus, diphtheria, and acellular pertussis (Tdap, Td) vaccine. You may need a Td booster every 10 years.  Zoster vaccine. You may need this after age 47.  Pneumococcal 13-valent conjugate (PCV13) vaccine. You may need this if you have certain conditions and were not previously vaccinated.  Pneumococcal polysaccharide (PPSV23) vaccine. You may need one or two doses if you smoke cigarettes or if you have certain conditions. Talk to your health care provider about which screenings and vaccines you need and how often you need them. This information is not intended to replace advice given to you by your health care provider. Make sure you discuss any questions you have with your health care provider. Document Released: 05/29/2015 Document Revised: 01/20/2016 Document Reviewed: 03/03/2015 Elsevier Interactive Patient Education  2017 [employer].

## 2019-09-11 NOTE — Progress Notes (Signed)
This visit is being conducted via phone call due to the COVID-19 pandemic. This patient has given me verbal consent via phone to conduct this visit, patient states they are participating from their home address. Some vital signs may be absent or patient reported.   Patient identification: identified by name, DOB, and current address.  Location provider: [facility], Office Persons participating in the virtual visit: Denman George LPN, patient, and Dr. [name]   Subjective:   [name] is a 49 y.o. female who presents for Medicare Annual (Subsequent) preventive examination.  Review of Systems:   Cardiac Risk Factors include: hypertension;obesity (BMI >30kg/m2);sedentary lifestyle   Objective:     Vitals: There were no vitals taken for this visit.  There is no height or weight on file to calculate BMI.  Advanced Directives 09/11/2019 07/26/2019 06/13/2018 01/05/2017 12/21/2016 01/14/2016 01/14/2016  Does Patient Have a Medical Advance Directive? No No No No No No No  Does patient want to make changes to medical advance directive? Yes (MAU/Ambulatory/Procedural Areas - Information given) - - - - - -  Would patient like information on creating a medical advance directive? - Yes (ED - Information included in AVS) Yes (MAU/Ambulatory/Procedural Areas - Information given) Yes (Inpatient - patient defers creating a medical advance directive at this time) Yes (MAU/Ambulatory/Procedural Areas - Information given) No - patient declined information -    Tobacco Social History   Tobacco Use  Smoking Status Never Smoker  Smokeless Tobacco Never Used     Counseling given: Not Answered   Clinical Intake:  Pre-visit preparation completed: Yes  Pain : No/denies pain  Diabetes: No  How often do you need to have someone help you when you read instructions, pamphlets, or other written materials from your doctor or pharmacy?: 1 - Never  Interpreter Needed?: No  Information entered by  :: Denman George LPN  Past Medical History:  Diagnosis Date  . Allergic rhinitis   . Ankle fracture, right   . Anxiety   . ASCUS of cervix with negative high risk HPV 05/2018  . Carpal tunnel syndrome   . Cervical cancer ([place]) 1998  . Fibromyalgia   . Hyperlipidemia   . Hypertension   . Migraine headache   . Obesity   . Sleep apnea    Past Surgical History:  Procedure Laterality Date  . ABDOMINAL HYSTERECTOMY  10/1996  . ABDOMINAL SURGERY  jan/11/2012   gastric bypass surgery  . CARPAL TUNNEL RELEASE     bilateral   . CESAREAN SECTION    . CHOLECYSTECTOMY  07/1992  . ESOPHAGOGASTRODUODENOSCOPY N/A 01/14/2016   Procedure: ESOPHAGOGASTRODUODENOSCOPY (EGD);  Surgeon: Mauri Pole, MD;  Location: [facility] ENDOSCOPY;  Service: Endoscopy;  Laterality: N/A;   Family History  Problem Relation Age of Onset  . Diabetes Mother   . Emphysema Mother   . Allergies Mother   . Hypertension Mother   . Heart disease Father   . Allergies Father   . Prostate cancer Father   . Breast cancer Maternal Grandmother 97   Social History   Socioeconomic History  . Marital status: Single    Spouse name: Not on file  . Number of children: Not on file  . Years of education: Not on file  . Highest education level: Not on file  Occupational History  . Occupation: disabled. prev worked as a Haematologist  . Smoking status: Never Smoker  . Smokeless tobacco: Never Used  Substance and Sexual Activity  .  Alcohol use: No  . Drug use: No  . Sexual activity: Yes    Comment: TAH-1st intercourse 89 yo-5 partners  Other Topics Concern  . Not on file  Social History Narrative  . Not on file   Social Determinants of Health   Financial Resource Strain:   . Difficulty of Paying Living Expenses:   Food Insecurity:   . Worried About Charity fundraiser in the Last Year:   . Arboriculturist in the Last Year:   Transportation Needs:   . Film/video editor (Medical):   Marland Kitchen Lack of  Transportation (Non-Medical):   Physical Activity:   . Days of Exercise per Week:   . Minutes of Exercise per Session:   Stress:   . Feeling of Stress :   Social Connections:   . Frequency of Communication with Friends and Family:   . Frequency of Social Gatherings with Friends and Family:   . Attends Religious Services:   . Active Member of Clubs or Organizations:   . Attends Archivist Meetings:   Marland Kitchen Marital Status:     Outpatient Encounter Medications as of 09/11/2019  Medication Sig  . ARIPiprazole (ABILIFY) 10 MG tablet Take 10 mg by mouth daily.  . Armodafinil 250 MG tablet Take 250 mg by mouth daily.   . Biotin 10 MG TABS Take 10 mg by mouth daily.  Marland Kitchen buPROPion (WELLBUTRIN XL) 150 MG 24 hr tablet TAKE 1 TABLET BY MOUTH DAILY  . BYSTOLIC 10 MG tablet TAKE 1 TABLET BY MOUTH EVERY DAY  . calcium citrate (CALCITRATE - DOSED IN MG ELEMENTAL CALCIUM) 950 MG tablet Take 1 tablet by mouth daily.  . clobetasol cream (TEMOVATE) [id number] % Apply 1 application topically 2 (two) times daily. Cover both legs with Colbetasol cream  . clonazePAM (KLONOPIN) 1 MG tablet TAKE 1 TABLET BY MOUTH EVERY DAY (Patient taking differently: Take 1 mg by mouth at bedtime. )  . collagenase (SANTYL) ointment Apply topically daily. apply Santyl to scabs on legs  . cyclobenzaprine (FLEXERIL) 10 MG tablet TAKE 1 TABLET BY MOUTH 3 TIMES DAILY AS NEEDED (Patient taking differently: Take 10 mg by mouth 3 (three) times daily as needed for muscle spasms. )  . DULoxetine (CYMBALTA) 60 MG capsule Take 2 capsules (120 mg total) by mouth daily.  Marland Kitchen eletriptan (RELPAX) 40 MG tablet Take 1 tablet (40 mg total) by mouth as needed for migraine. may repeat in 2 hours if necessary  . FEROSUL 325 (65 Fe) MG tablet TAKE 1 TABLET BY MOUTH EVERY DAY WITH BREAKFAST (Patient taking differently: Take 325 mg by mouth daily with breakfast. )  . furosemide (LASIX) 80 MG tablet TAKE 1 TABLET BY MOUTH DAILY. TAKE one extra TABLET IF  weight INCREASE by 5 lbs AND call your doctor FOR further instruction  . gabapentin (NEURONTIN) 600 MG tablet Take 1,200 mg by mouth 3 (three) times daily.  Marland Kitchen HYDROcodone-acetaminophen (NORCO) 10-325 MG tablet Take 1 tablet by mouth every 6 (six) hours as needed.  . irbesartan (AVAPRO) 75 MG tablet Take 1 tablet (75 mg total) by mouth daily.  . meclizine (ANTIVERT) 25 MG tablet Take 1 tablet (25 mg total) by mouth 3 (three) times daily as needed for dizziness.  . NYSTATIN powder APPLY 4 grams TO THE AFFECTED AREA 4 TIMES DAILY (Patient taking differently: Apply 1 application topically 4 (four) times daily. )  . Oxcarbazepine (TRILEPTAL) 300 MG tablet Take 300 mg by mouth 2 (two) times  daily.  . pantoprazole (PROTONIX) 40 MG tablet TAKE 1 TABLET BY MOUTH 2 TIMES DAILY (Patient taking differently: Take 40 mg by mouth 2 (two) times daily. )  . pentoxifylline (TRENTAL) 400 MG CR tablet Take 400 mg by mouth 3 (three) times daily with meals.   . promethazine (PHENERGAN) 25 MG tablet TAKE 1 TABLET BY MOUTH EVERY 6 HOURS AS NEEDED FOR NAUSEA AND VOMITING  . rosuvastatin (CRESTOR) 20 MG tablet TAKE 1 TABLET BY MOUTH EVERY DAY (Patient taking differently: Take 20 mg by mouth every evening. )  . senna-docusate (SENOKOT-S) 8.6-50 MG tablet Take 1 tablet by mouth at bedtime.  . SKYRIZI, 150 MG DOSE, 75 MG/0.83ML PSKT Inject 150 mg into the skin every 3 (three) months.   . [DISCONTINUED] amoxicillin-clavulanate (AUGMENTIN) 875-125 MG tablet Take 1 tablet by mouth 2 (two) times daily.  . [DISCONTINUED] halobetasol (ULTRAVATE) 0.05 % ointment APPLY TOPICALLY 2 TIMES DAILY   No facility-administered encounter medications on file as of 09/11/2019.    Activities of Daily Living In your present state of health, do you have any difficulty performing the following activities: 09/11/2019 08/08/2019  Hearing? N N  Vision? N N  Difficulty concentrating or making decisions? N N  Walking or climbing stairs? N N  Dressing  or bathing? N N  Doing errands, shopping? N N  Preparing Food and eating ? N -  Using the Toilet? N -  In the past six months, have you accidently leaked urine? N -  Do you have problems with loss of bowel control? N -  Managing your Medications? N -  Managing your Finances? N -  Housekeeping or managing your Housekeeping? N -  Some recent data might be hidden    Patient Care Team: [name], MD as PCP - General Eino Farber, PA-C as Consulting Physician (Physician Assistant) Blima Dessert, MD as Referring Physician (Rheumatology) Loa Socks, MD as Referring Physician (Dermatology) Terrance Mass, MD (Inactive) (Gynecology) Phineas Real Belinda Block, MD as Consulting Physician (Gynecology)    Assessment:   This is a routine wellness examination for Ailyne.  Exercise Activities and Dietary recommendations Current Exercise Habits: The patient does not participate in regular exercise at present  Goals    . Increase physical activity     Increase activity by walking more.     . Weight (lb) < 320 lb (145.2 kg)     Lose weight by watching portion control, healthy options and increasing activity as tolerated.        Fall Risk Fall Risk  09/11/2019 08/08/2019 05/02/2019 04/24/2019 11/26/2018  Falls in the past year? 1 0 0 0 0  Number falls in past yr: 0 0 0 0 0  Injury with Fall? 0 0 0 0 0  Risk for fall due to : History of fall(s);Impaired mobility Impaired balance/gait;Impaired mobility - Impaired balance/gait;Impaired mobility -  Risk for fall due to: Comment - - - - -  Follow up Falls evaluation completed;Education provided;Falls prevention discussed Falls evaluation completed Falls evaluation completed Falls evaluation completed -   Is the patient's home free of loose throw rugs in walkways, pet beds, electrical cords, etc?   yes      Grab bars in the bathroom? yes      Handrails on the stairs?   yes      Adequate lighting?   yes  Depression Screen PHQ 2/9 Scores  09/11/2019 08/08/2019 05/02/2019 04/24/2019  PHQ - 2 Score 0 0 6 0  PHQ-  9 Score - 0 18 0  Exception Documentation - - - -     Cognitive Function- no cognitive concerns at this time    6CIT Screen 09/11/2019  What Year? 0 points  What month? 0 points  What time? 0 points  Count back from 20 0 points  Months in reverse 0 points  Repeat phrase 0 points  Total Score 0    Immunization History  Administered Date(s) Administered  . Influenza Split 03/17/2011  . Influenza Whole 02/15/2010  . Influenza,inj,Quad PF,6+ Mos 05/17/2015, 02/23/2016, 02/13/2017, 01/18/2019  . Influenza-Unspecified 02/27/2013, 01/11/2018  . PFIZER SARS-COV-2 Vaccination 08/15/2019, 09/09/2019  . Pneumococcal Conjugate-13 10/04/2017  . Pneumococcal Polysaccharide-23 03/17/2011, 01/10/2018  . Td 05/16/2002  . Tdap 07/24/2018    Qualifies for Shingles Vaccine?Discussed and patient will check with pharmacy for coverage.  Patient education handout provided   Screening Tests Health Maintenance  Topic Date Due  . INFLUENZA VACCINE  12/15/2019  . TETANUS/TDAP  07/23/2028  . COVID-19 Vaccine  Completed  . HIV Screening  Completed    Cancer Screenings: Lung: Low Dose CT Chest recommended if Age 78-80 years, 30 pack-year currently smoking OR have quit w/in 15years. Patient does not qualify. Breast:  Up to date on Mammogram? No; patient will consider for this year and schedule  Up to date of Bone Density/Dexa? Yes Colorectal: starting at age 65      Plan:    I have personally reviewed and addressed the Medicare Annual Wellness questionnaire and have noted the following in the patient's chart:  A. Medical and social history B. Use of alcohol, tobacco or illicit drugs  C. Current medications and supplements D. Functional ability and status E.  Nutritional status F.  Physical activity G. Advance directives H. List of other physicians I.  Hospitalizations, surgeries, and ER visits in previous 12 months J.    [place] such as hearing and vision if needed, cognitive and depression L. Referrals, records requested, and appointments- none  In addition, I have reviewed and discussed with patient certain preventive protocols, quality metrics, and best practice recommendations. A written personalized care plan for preventive services as well as general preventive health recommendations were provided to patient.   Signed,  Denman George, LPN  Nurse Health Advisor   Nurse Notes: no additional

## 2019-09-16 ENCOUNTER — Other Ambulatory Visit: Payer: Self-pay | Admitting: Family Medicine

## 2019-09-16 MED ORDER — HYDROCODONE-ACETAMINOPHEN 10-325 MG PO TABS: 1.0000 | ORAL_TABLET | Freq: Four times a day (QID) | ORAL | 0 refills | Status: DC | PRN

## 2019-09-16 NOTE — Telephone Encounter (Signed)
Last OV 08/08/19 Hydrocodone last filled 08/22/19 #90 with 0

## 2019-09-19 ENCOUNTER — Encounter: Admit: 2019-09-19 | Discharge: 2019-09-19 | Payer: MEDICARE

## 2019-09-19 ENCOUNTER — Other Ambulatory Visit: Payer: Self-pay | Admitting: General Practice

## 2019-09-19 DIAGNOSIS — M170 Bilateral primary osteoarthritis of knee: Principal | ICD-10-CM

## 2019-09-19 DIAGNOSIS — M300 Polyarteritis nodosa: Principal | ICD-10-CM

## 2019-09-19 DIAGNOSIS — L409 Psoriasis, unspecified: Principal | ICD-10-CM

## 2019-09-19 DIAGNOSIS — B353 Tinea pedis: Principal | ICD-10-CM

## 2019-09-19 DIAGNOSIS — L958 Other vasculitis limited to the skin: Secondary | ICD-10-CM | POA: Diagnosis not present

## 2019-09-19 MED ORDER — IRBESARTAN 75 MG PO TABS: 75.0000 mg | ORAL_TABLET | Freq: Every day | ORAL | 0 refills | Status: DC

## 2019-09-19 MED ORDER — DICLOFENAC 1 % TOPICAL GEL
Freq: Four times a day (QID) | TOPICAL | 11 refills | 19.00000 days | Status: CP
Start: 2019-09-19 — End: ?

## 2019-09-19 NOTE — Unmapped (Signed)
DERMATOLOGY CLINIC NOTE      Skyrizi    A/P:    erythema induratum/nodular vasculitis AND chronic venous stasis dermatitis/lymphedema--stable, inactive: Histological ddx of medium vessel vasculitis/PAN with review by Dr. Eyvonne Left favoring nodular vasculitis/erythema induratum.  - Suspect morbid obesity contributes to impedence of both venous and lymphatic return resulting in stasis changes (secondary lymphedema)   - Continue pentoxifylline 400mg  TID  - Continue horse chestnut extract 250mg  BID  - continue Gabapentin 1200 mg TID per rheumatology  -??She is unable to tolerate compression and SCDs. continue with lymphedema massage PRN which has been helpful.  She is getting a new pair of compression stockings this week to try.  - Toficitinib has also been utilized for PAN and can help with psoriasis and may be considered in the future if needed.  ??  Plaque psoriasis: stable, well controlled on Skyrizi  - continue Skyrizi subcutaneous q 12 weeks  - tolerating well without SE  - encouraged halobetasol BID PRN to active areas, including the left foot with a negative KOH  - advised her that if the cellulitis were to recur or she were get sick with a febrile illness such as covid or flu to hold on her skyrizi if she was due.  - start urea nightly  ??  High risk medication use:  - quant gold ordered    rtc 6 mos    CC:  Chief Complaint   Patient presents with   ??? Psoriasis     the areas on the inside of her legs are still flaring but its getting a little better       HPI:  49 y.o. female seen in follow up for psoriasis and EI/NV.  That latter has not been bothersome to her and she denies any new nodules.  Her psoriasis is well controlled on skyrizi which was started at the last visit.  She reports one active area on her left foot.  She has not treated this area yet.  Other areas well controlled.  Denies injection site reactions.  Reports she was hospitalized with cellulitis recently.  She was not due for her skyrizi during this period.  It resolved with IV Abx.  The patient denies any other new or changing lesions or areas of concern.       Prior history:  - Course: failed MTX (11/2014), plaquenil (8/16), imuran (3/17), imuran (5/17), dapsone; added Humira 06/2017.  - Biopsies: small vessel vasculitis suggestive for PAN in 2016; venous stasis disease 08/2016; deep arteritis with some ??  - Cellulitis of the R lower extremities in setting of chronic venous stasis dermatitis and chronic leg edema. Cultures with 3+ pasturella and  3+ MSSA. Resolved s/p azithromycin, moxifloxacin 400mg .    Pertinent PMH: Reviewed in Epic.     Patient Active Problem List   Diagnosis   ??? Anxiety   ??? History of surgical procedure   ??? Degeneration of intervertebral disc   ??? Muscle pain   ??? Cutaneous fungal infection   ??? Gastroesophageal reflux disease   ??? Hyperlipidemia   ??? Hypertension   ??? Migraine headache   ??? Morbid obesity (CMS-HCC)   ??? [name] (obstructive sleep apnea)   ??? Psoriasis   ??? PAN (polyarteritis nodosa) (CMS-HCC)   ??? DJD (degenerative joint disease)   ??? Fibromyalgia   ??? Neuropathy   ??? Acute blood loss anemia   ??? Adverse drug reaction   ??? Bleeding stomach ulcer   ??? BRBPR (bright red blood per rectum)   ???  Chronic gout of multiple sites   ??? Chronic narcotic use   ??? Contracture of tendon   ??? Elevated blood sugar   ??? Other malaise and fatigue   ??? Frequent urination   ??? Melena   ??? Leg pain, lateral   ??? Leg pain, right   ??? Pain in joint, lower leg   ??? Ingrowing nail   ??? Other abnormal glucose       ROS: Baseline state of health. No fever, no other skin lesions except as noted in HPI    PE:  General: Well-developed, well-nourished, in no acute distress  Neuro: Alert and oriented, interacts appropriately  Mouth: not examined as patient wearing a mask  Ext: no inflammatory or dystrophic changes of nails  Skin: Examination with inspection and palpation of the back, b/l UE, b/l LE was performed and notable for the following. All other areas examined were normal or had no significant findings.  - erythematous scaly plaque on left mid foot   - Woody induration of lower legs with marked hyperpigmentation; no subcutaneous plaques or nodules

## 2019-09-19 NOTE — Unmapped (Addendum)
You can use Eurcerin soothing repair

## 2019-09-19 NOTE — Unmapped (Signed)
Patient Name: Autumn Mcdonald  PCP: ??Frederico Hamman, MD      Chief Compliant: Cutaneous PAN    HPI: Autumn Mcdonald is a 49 y.o. female with hx of cutaneous PAN of the legs. 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 Additional hx of fibromyalgia for which she is taking duloxetine and gabapentin.   Treatment hx:  - Started prednisone taper and then mtx in 11/2014.  - Plaquenil started in 12/2014.   - Started imuran in 07/2015, and titrated up to 150 mg qd w/o sufficient control of her PAN.   - Switched to cellcept 09/2015, titrated up to 1500 mg BID.   - Persistent leg swelling and pain, thought to be due to swelling from prednisone, so tapered off prednisone  - Re-biopsied 08/2016, results consistent with venous stasis disease, so it was unclear if her symptoms were due to PAN vs venous stasis.  - Due to persistent sx, cellcept discontinued and dapsone started by dermatology. Also received intralesional steroid injections with derm.   -Was evaluated by vascular surgery and no vascular insufficiency was noted, thus her leg swelling has been attributed to lymphedema.   - Plaque psoriasis diagnosed by dermatology 2019. Humira started 08/2017.   - HCQ discontinued 09/2017 due to new dx of psoriasis and unclear benefit for PAN.   - Developed TNF induced psoriasis, so humira discontinued and skyrizi started 03/2019  - Current medication regimen:  skyrizi for psoriasis.     Interim history:  Presents today for f/u.     In March she was hospitalized for cellulitis of the legs and resulting sepsis. Admitted to [facility] for 7 days and received IV abx. This has since resolved. She has f/u w/ PCP since discharge.     She is taking lasix for edema in legs. Has increased this to 80 mg BID for the last 2 wks due to swelling and wt gain. Has lost 10 lbs in the last 2 wks with this.   She believes some of her weight currently is fluid weight, and working with PCP for fluid balance. Reports she was around 325 lbs during her hospitalization in March, and she has gained fluid weight since that time. She reports she is eating very healthfully. She was walking 6 days weekly for exercise, but in the past couple of weeks has not been exercising due to pain in the knees. She tried voltaren gel from her PCP for knee pain, used once daily, did not see any improvement.   She has also ordered compression stockings for her leg swelling. Used to see lymphedema clinic, but could not afford f/u with them.     Continues to have swelling and pain in the legs, but no worse in the interim. Feels overall stable.   Psoriasis doing well with skyrizi. She does have peeling of the skin on the feet, unclear if this is related to psoriasis. Will see dermatology later today.     ROS??: Attests to the above, otherwise, review of ten system is negative.  ????Record Review: Available records were reviewed, including pertinent office visits, labs, and imaging.      Past Medical and Surgical History:  ??  Patient Active Problem List    Diagnosis Date Noted   ??? Melena 02/16/2017   ??? Chronic narcotic use 09/07/2016   ??? Bleeding stomach ulcer 01/20/2016   ??? Acute blood loss anemia 01/14/2016   ??? BRBPR (bright red blood per rectum) 01/14/2016   ???  Neuropathy 12/09/2015   ??? Migraine headache 11/28/2014   ??? PAN (polyarteritis nodosa) (CMS-HCC) 11/28/2014   ??? Chronic gout of multiple sites 02/25/2014   ??? Gastroesophageal reflux disease 01/08/2014   ??? Leg pain, lateral 12/11/2012   ??? History of surgical procedure 10/31/2012   ??? Psoriasis 01/09/2012   ??? Degeneration of intervertebral disc 11/30/2011   ??? Cutaneous fungal infection 11/15/2011   ??? DJD (degenerative joint disease) 10/14/2011   ??? Frequent urination 04/13/2011   ??? [name] (obstructive sleep apnea) 11/16/2010   ??? Elevated blood sugar 08/23/2010   ??? Other abnormal glucose 10/08/2009   ??? Contracture of tendon 08/17/2009   ??? Leg pain, right 11/05/2008   ??? Other malaise and fatigue 09/19/2008 ??? Ingrowing nail 06/17/2008   ??? Adverse drug reaction 04/03/2008   ??? Morbid obesity (CMS-HCC) 01/03/2007   ??? Hyperlipidemia 09/22/2006   ??? Pain in joint, lower leg 09/22/2006   ??? Muscle pain 09/20/2006   ??? Fibromyalgia 09/20/2006   ??? Anxiety 06/28/2006   ??? Hypertension 06/28/2006     Past Medical History:   Diagnosis Date   ??? Allergic     See other notes   ??? Arthritis 05/16/2001   ??? Bleeding gastric ulcer    ??? Cervical cancer (CMS-HCC)    ??? Depression    ??? Disorder of skin or subcutaneous tissue 2008    Diagnosised in   ??? Eczema 05/16/2014   ??? Fibromyalgia    ??? Hypertension    ??? Morbid obesity (CMS-HCC)    ??? Psoriasis Last 2 years     Past Surgical History:   Procedure Laterality Date   ??? CESAREAN SECTION  1994   ??? CHOLECYSTECTOMY  March 1994   ??? GASTRIC BYPASS  January 2014   ??? HYSTERECTOMY  June 1998   ??? SKIN BIOPSY  2017     Allergies:   ??  Allergies   Allergen Reactions   ??? Iohexol Hives     Pt treated with PO benedryl   ??? Keflex [Cephalexin] Shortness Of Breath and Hives   ??? Lincomycin Hcl Hives   ??? Lisinopril Hives     Hives  Hives   ??? Sulfa (Sulfonamide Antibiotics) Swelling and Rash   ??? Sulfamethoxazole-Trimethoprim Hives     REACTION: Rash, throat closed, eyes swelled.  Hives  Hives   ??? Aspirin      REACTION: Hives  High Doses only. Causes rash.  High Doses only. Causes rash.   ??? Clindamycin      Hives  Hives   ??? Doxycycline      REACTION: Hives  Hives  Hives   ??? Naproxen      REACTION: Hives  Eyes well  Eyes well   ??? Niacin      REACTION: Swelling, problems breathing   ??? Benzoin Compound Rash   ??? Pneumococcal 23-Val Ps Vaccine Rash     cellulitis     Current Outpatient Medications:  ??  Current Outpatient Medications   Medication Sig Dispense Refill   ??? ARIPiprazole (ABILIFY) 10 MG tablet Take 10 mg by mouth daily.     ??? armodafinil (NUVIGIL) 250 mg tablet Take 250 mg by mouth three (3) times a day.      ??? betamethasone dipropionate (DIPROLENE) 0.05 % ointment Apply topically Two (2) times a day. To affected areas as needed 60 g 5   ??? biotin 10 mg Tab 10 mg per 1 tablet, ORAL, Daily, 0 Refill(s)     ???  buPROPion (WELLBUTRIN XL) 150 MG 24 hr tablet Take 150 mg by mouth.     ??? busPIRone (BUSPAR) 30 MG tablet Take 30 mg by mouth.     ??? calcium citrate (CALCITRATE) 200 mg elem calcium (950 mg) tablet 950 mg per 1 tablet, ORAL, Daily, 60 tablet, 0 Refill(s)     ??? clonazePAM (KLONOPIN) 1 MG tablet TAKE 1 TABLET BY MOUTH EVERY DAY     ??? cyclobenzaprine (FLEXERIL) 10 MG tablet Take 10 mg by mouth once as needed.      ??? DULoxetine (CYMBALTA) 60 MG capsule Take 60 mg by mouth daily. 120 mg daily     ??? eletriptan (RELPAX) 40 MG tablet Take 40 mg by mouth.     ??? ferrous sulfate 325 (65 FE) MG tablet Take 1 tablet (325 mg total) by mouth daily with breakfast.  6   ??? fluconazole (DIFLUCAN) 100 MG tablet Take 100 mg by mouth.     ??? furosemide (LASIX) 80 MG tablet TAKE 1 TABLET BY MOUTH DAILY. can TAKE 1 extra TABLET IF weigh INCREASE by 5lb AND call your doctor FOR further instruction     ??? gabapentin (NEURONTIN) 600 MG tablet Take 2 tablets (1,200 mg total) by mouth Three (3) times a day. 90 tablet 3   ??? nebivolol (BYSTOLIC) 10 MG tablet Take 10 mg by mouth daily.      ??? NYSTOP 100,000 unit/gram powder APPLY 4 grams TO THE AFFECTED AREA 4 TIMES DAILY     ??? OXcarbazepine (TRILEPTAL) 300 MG tablet Take 300 mg by mouth.     ??? pantoprazole (PROTONIX) 40 MG tablet Take 40 mg by mouth Two (2) times a day. bid     ??? pentoxifylline (TRENTAL) 400 mg CR tablet Take 1 tablet (400 mg total) by mouth Three (3) times a day with a meal. 90 tablet 2   ??? promethazine (PHENERGAN) 25 MG tablet once as needed.      ??? risankizumab-rzaa (SKYRIZI) 150mg /1.51mL(75 mg/0.83 mL x2) SyKt Inject the contents of two syringes (150 mg total) under the skin at weeks 0, 4, and then every 12 weeks thereafter. 1 each 6   ??? rosuvastatin (CRESTOR) 20 MG tablet TAKE 1 TABLET BY MOUTH EVERY DAY       No current facility-administered medications for this visit. PHYSICAL EXAM??  Vitals:    09/19/19 1206   BP: 127/83   BP Site: L Arm   BP Position: Sitting   BP Cuff Size: X-Large   Pulse: 95   Temp: 37.9 ??C (100.2 ??F)   TempSrc: Temporal   Weight: (!) 165.6 kg (365 lb)   Height: 157.5 cm (5' 2)     Wt Readings from Last 6 Encounters:   09/19/19 (!) 165.6 kg (365 lb)   09/12/18 (!) 156.9 kg (346 lb)   01/10/18 (!) 166 kg (366 lb)   10/04/17 (!) 180.4 kg (397 lb 11.2 oz)   05/24/17 (!) 172.8 kg (381 lb)   04/26/17 (!) 171.9 kg (378 lb 15.5 oz)         General:   Pleasant 48 y.o.female in no acute distress, obese    Eyes:   PERRL, conjunctiva and sclera not inflamed. Tears appear adequate.    Cardiovascular:  Regular rate and rhythm. No murmur, rub, or gallop. b/l LE edema.    Lungs:  Clear to auscultation.Normal respiratory effort.    Musculoskeletal:   Ambulates w/o assistance. Hands w/o swelling or tenderness. FROM elbows and shoulders. Knees  with some restricted flexion due to body habitus, painful ROM with crepitus. Edematous ankles and feet.    Neurological:  CN 2-12 grossly intact. 5/5 strength on extremities.   Psych:  Appropriate affect and mood   Skin:  B/l shins with dusky erythema and lymphedema b/l LE. Peeling of skin on soles of feet.          ??GENERAL SUMMARY/IMPRESSION ??AND RECOMMENDATIONS: ??  ??1. Cutaneous polyarteritis nodosa (CMS-HCC)  Overall stable appearing, remain off immunosuppressive medications for PAN.     2. Primary osteoarthritis of both knees  Re-trial of voltaren gel, suggest at least TID dosing for better benefit. Encouraged walking daily for exercise. Exacerbated by obesity, and may need to consider referral for bariatric surgery if pt would be amenable. Did not discuss with pt today.   - diclofenac sodium (VOLTAREN) 1 % gel; Apply 2-4 g topically Four (4) times a day.  Dispense: 300 g; Refill: 11    3. Psoriasis  Continue f/u with dermatology and medication recommendations from derm    HCM:   - PCV13 Status: 10/04/17  - PPSV 23 Status: 03/17/11, 01/10/18  - Annual Influenza vaccine. Status: 01/18/19  - COVID-19 vaccine: completed   - Bone health: not on prednisone   - Plaquenil eye exam: no longer taking HCQ  - Contraception: did not discuss       >30 min spent in consultation with pt, >50% of which was spent discussing diagnosis and treatment options.   Return appt in 2 mo as scheduled with Dr Scarlette Calico

## 2019-09-22 MED ORDER — SKYRIZI 150 MG/1.66 ML(75 MG/0.83 ML X 2) SUBCUTANEOUS SYRINGE KIT
SUBCUTANEOUS | 4 refills | 0.00000 days | Status: CP
Start: 2019-09-22 — End: ?
  Filled 2019-10-21: qty 1, 30d supply, fill #0

## 2019-09-23 DIAGNOSIS — L409 Psoriasis, unspecified: Principal | ICD-10-CM

## 2019-09-23 LAB — QUANTIFERON TB GOLD PLUS
Lab: NEGATIVE
QUANTIFERON ANTIGEN 1 MINUS NIL: 0.02 [IU]/mL
QUANTIFERON ANTIGEN 2 MINUS NIL: 0 [IU]/mL
QUANTIFERON TB NIL VALUE: 0.08 [IU]/mL

## 2019-09-23 LAB — TB AG2 VALUE: Lab: 0.08

## 2019-09-23 LAB — TB AG1 VALUE: Lab: 0.1

## 2019-09-23 LAB — TB NIL VALUE: Lab: 0.08

## 2019-09-23 LAB — TB MITOGEN VALUE: Lab: 2.36

## 2019-09-24 NOTE — Unmapped (Signed)
Neg quant gold.

## 2019-09-25 ENCOUNTER — Telehealth (INDEPENDENT_AMBULATORY_CARE_PROVIDER_SITE_OTHER): Payer: Medicare HMO | Admitting: Family Medicine

## 2019-09-25 ENCOUNTER — Encounter: Payer: Self-pay | Admitting: Family Medicine

## 2019-09-25 ENCOUNTER — Other Ambulatory Visit: Payer: Self-pay | Admitting: Family Medicine

## 2019-09-25 ENCOUNTER — Other Ambulatory Visit: Payer: Self-pay

## 2019-09-25 VITALS — BP 135/91 | HR 71

## 2019-09-25 DIAGNOSIS — B9689 Other specified bacterial agents as the cause of diseases classified elsewhere: Secondary | ICD-10-CM

## 2019-09-25 DIAGNOSIS — J329 Chronic sinusitis, unspecified: Secondary | ICD-10-CM | POA: Diagnosis not present

## 2019-09-25 MED ORDER — AMOXICILLIN 875 MG PO TABS: 875.0000 mg | ORAL_TABLET | Freq: Two times a day (BID) | ORAL | 0 refills | Status: DC

## 2019-09-25 NOTE — Progress Notes (Signed)
I have discussed the procedure for the virtual visit with the patient who has given consent to proceed with assessment and treatment.   Jessica L Brodmerkel, CMA     

## 2019-09-25 NOTE — Progress Notes (Signed)
Virtual Visit via Video   I connected with patient on 09/25/19 at 10:00 AM EDT by a video enabled telemedicine application and verified that I am speaking with the correct person using two identifiers.  Location patient: Home Location provider: [facility]up[facility]ist, Office Persons participating in the virtual visit: Patient, Provider, [place] (Jess B)  I discussed the limitations of evaluation and management by telemedicine and the availability of in person appointments. The patient expressed understanding and agreed to proceed.  Subjective:   HPI:   Sinus congestion- sxs started 'as a sinus thing' but then moved into chest 'about 3 days ago'.  No fevers.  + HA, gum pain.  + maxillary sinus pain.  + nasal congestion.  Cough is intermittently productive using Mucinex.  No blood in nasal drainage.  Feels similar to previous sinus infxn.  Pt is immunosuppressed on Skyrizi.    ROS:   See pertinent positives and negatives per HPI.  Patient Active Problem List   Diagnosis Date Noted  . Hyponatremia 07/27/2019  . Sepsis ([place]) 07/26/2019  . Urinary urgency 04/04/2018  . Chronic narcotic use 09/07/2016  . Acute blood loss anemia 01/14/2016  . Multiple gastric ulcers   . PAN (polyarteritis nodosa) ([place]) 11/24/2015  . GERD (gastroesophageal reflux disease) 01/08/2014  . Routine general medical examination at a health care facility 04/23/2013  . Bariatric surgery status 10/31/2012  . Psoriasis 01/09/2012  . DDD (degenerative disc disease), lumbar 11/30/2011  . Migraine headache   . [name] (obstructive sleep apnea) 11/16/2010  . HYPERGLYCEMIA, FASTING 10/08/2009  . CONTRACTURE OF TENDON 08/17/2009  . PARESTHESIA, HANDS 12/23/2008  . Leg pain, right 11/05/2008  . ADVERSE DRUG REACTION 04/03/2008  . PERIPHERAL EDEMA 03/26/2008  . Morbid obesity ([place]) 01/03/2007  . Cellulitis 01/03/2007  . Hyperlipidemia 09/22/2006  . Fibromyalgia 09/20/2006  . Anxiety and depression 06/28/2006    Social  History   Tobacco Use  . Smoking status: Never Smoker  . Smokeless tobacco: Never Used  Substance Use Topics  . Alcohol use: No    Current Outpatient Medications:  .  ARIPiprazole (ABILIFY) 10 MG tablet, Take 10 mg by mouth daily., Disp: , Rfl:  .  Armodafinil 250 MG tablet, Take 250 mg by mouth daily. , Disp: , Rfl:  .  Biotin 10 MG TABS, Take 10 mg by mouth daily., Disp: , Rfl:  .  buPROPion (WELLBUTRIN XL) 150 MG 24 hr tablet, TAKE 1 TABLET BY MOUTH DAILY, Disp: 30 tablet, Rfl: 3 .  BYSTOLIC 10 MG tablet, TAKE 1 TABLET BY MOUTH EVERY DAY, Disp: 90 tablet, Rfl: 0 .  calcium citrate (CALCITRATE - DOSED IN MG ELEMENTAL CALCIUM) 950 MG tablet, Take 1 tablet by mouth daily., Disp: , Rfl:  .  clobetasol cream (TEMOVATE) [id number] %, Apply 1 application topically 2 (two) times daily. Cover both legs with Colbetasol cream, Disp: 30 g, Rfl: 0 .  clonazePAM (KLONOPIN) 1 MG tablet, TAKE 1 TABLET BY MOUTH EVERY DAY (Patient taking differently: Take 1 mg by mouth at bedtime. ), Disp: 30 tablet, Rfl: 3 .  collagenase (SANTYL) ointment, Apply topically daily. apply Santyl to scabs on legs, Disp: 15 g, Rfl: 0 .  cyclobenzaprine (FLEXERIL) 10 MG tablet, TAKE 1 TABLET BY MOUTH 3 TIMES DAILY AS NEEDED (Patient taking differently: Take 10 mg by mouth 3 (three) times daily as needed for muscle spasms. ), Disp: 270 tablet, Rfl: 0 .  DULoxetine (CYMBALTA) 60 MG capsule, Take 2 capsules (120 mg total) by mouth daily.,  Disp: 240 capsule, Rfl: 2 .  eletriptan (RELPAX) 40 MG tablet, Take 1 tablet (40 mg total) by mouth as needed for migraine. may repeat in 2 hours if necessary, Disp: 15 tablet, Rfl: 3 .  FEROSUL 325 (65 Fe) MG tablet, TAKE 1 TABLET BY MOUTH EVERY DAY WITH BREAKFAST (Patient taking differently: Take 325 mg by mouth daily with breakfast. ), Disp: 30 tablet, Rfl: 6 .  furosemide (LASIX) 80 MG tablet, TAKE 1 TABLET BY MOUTH DAILY. TAKE one extra TABLET IF weight INCREASE by 5 lbs AND call your doctor FOR  further instruction, Disp: 30 tablet, Rfl: 0 .  gabapentin (NEURONTIN) 600 MG tablet, Take 1,200 mg by mouth 3 (three) times daily., Disp: , Rfl:  .  HYDROcodone-acetaminophen (NORCO) 10-325 MG tablet, Take 1 tablet by mouth every 6 (six) hours as needed., Disp: 90 tablet, Rfl: 0 .  irbesartan (AVAPRO) 75 MG tablet, Take 1 tablet (75 mg total) by mouth daily., Disp: 30 tablet, Rfl: 0 .  meclizine (ANTIVERT) 25 MG tablet, Take 1 tablet (25 mg total) by mouth 3 (three) times daily as needed for dizziness., Disp: 30 tablet, Rfl: 0 .  NYSTATIN powder, APPLY 4 grams TO THE AFFECTED AREA 4 TIMES DAILY (Patient taking differently: Apply 1 application topically 4 (four) times daily. ), Disp: 60 g, Rfl: 3 .  Oxcarbazepine (TRILEPTAL) 300 MG tablet, Take 300 mg by mouth 2 (two) times daily., Disp: , Rfl:  .  pantoprazole (PROTONIX) 40 MG tablet, TAKE 1 TABLET BY MOUTH 2 TIMES DAILY (Patient taking differently: Take 40 mg by mouth 2 (two) times daily. ), Disp: 180 tablet, Rfl: 1 .  pentoxifylline (TRENTAL) 400 MG CR tablet, Take 400 mg by mouth 3 (three) times daily with meals. , Disp: , Rfl: 11 .  promethazine (PHENERGAN) 25 MG tablet, TAKE 1 TABLET BY MOUTH EVERY 6 HOURS AS NEEDED FOR NAUSEA AND VOMITING, Disp: 45 tablet, Rfl: 3 .  rosuvastatin (CRESTOR) 20 MG tablet, TAKE 1 TABLET BY MOUTH EVERY DAY (Patient taking differently: Take 20 mg by mouth every evening. ), Disp: 90 tablet, Rfl: 1 .  senna-docusate (SENOKOT-S) 8.6-50 MG tablet, Take 1 tablet by mouth at bedtime., Disp: 30 tablet, Rfl: 0 .  SKYRIZI, 150 MG DOSE, 75 MG/0.83ML PSKT, Inject 150 mg into the skin every 3 (three) months. , Disp: , Rfl:   Allergies  Allergen Reactions  . Niacin Anaphylaxis, Swelling and Other (See Comments)    REACTION: Swelling, problems breathing  . Sulfamethoxazole-Trimethoprim Anaphylaxis, Swelling, Rash and Other (See Comments)    REACTION: Rash, throat closed, eyes swelled.  . Aspirin Hives  . Bactrim Hives  .  Benzoin Compound Rash  . Cephalexin Hives    Tolerated ceftriaxone and cefepime 07/2019  . Clindamycin/Lincomycin Hives  . Doxycycline Hives  . Iohexol Hives    Pt treated with PO benedryl  . Lisinopril Hives  . Naproxen Hives  . Pnu-Imune [Pneumococcal Polysaccharide Vaccine] Rash  . Sulfonamide Derivatives Rash    Objective:   BP (!) 135/91   Pulse 71   SpO2 98%   AAOx3, NAD, obese NCAT, EOMI No obvious CN deficits + maxillary sinus pain + nasal congestion Edentulous Coloring WNL Pt is able to speak clearly, coherently without shortness of breath or increased work of breathing.  Thought process is linear.  Mood is appropriate.   Assessment and Plan:   Bacterial sinusitis- pt's sxs are consistent w/ sinusitis and given her immunosuppressed state, we need to cover for bacterial  causes.  Start Amox (pt has taken previously w/o difficulty despite hives w/ Keflex).  Reviewed supportive care and red flags that should prompt return.  Pt expressed understanding and is in agreement w/ plan.    [name], MD 09/25/2019

## 2019-09-27 ENCOUNTER — Telehealth: Payer: Self-pay | Admitting: Family Medicine

## 2019-09-27 NOTE — Unmapped (Signed)
This encounter was created in error - please disregard.

## 2019-09-27 NOTE — Telephone Encounter (Signed)
Pt called requesting for Dr. Birdie Riddle to put orders in for her to get her potassium, sodium, and RBCs checked. Pt asked if orders could be sent to [facility]. Please advise.

## 2019-09-27 NOTE — Telephone Encounter (Signed)
Called and advised pt. She is going to wait until Monday to see how she is feeling and if still bad will call and make an appt to have it all assessed.

## 2019-09-27 NOTE — Telephone Encounter (Signed)
Pt states that he stomach feels off. She is worried that she may have the bleeding in her stomach again. She forgot to mention any of this to Dr. Birdie Riddle at last appointment. She wanted to potassium rechecked because they were all out of sorts the last time and she wanted to make sure they are still normal.

## 2019-09-27 NOTE — Telephone Encounter (Signed)
An upset stomach doesn't typically indicate bleeding, but if she has concerns, this is definitely something that requires an appt. Her last potassium was normal.  Her sodium was mildly low and we can place a BMP order to reassess but lab orders to assess the bleeding/GI concern is not appropriate.  That needs evaluation

## 2019-09-30 ENCOUNTER — Telehealth: Payer: Self-pay

## 2019-09-30 MED ORDER — GABAPENTIN 600 MG PO TABS: 1200.0000 mg | ORAL_TABLET | Freq: Three times a day (TID) | ORAL | 0 refills | Status: DC

## 2019-09-30 NOTE — Telephone Encounter (Signed)
gabapentin (NEURONTIN) 600 MG tablet  [facility] [place], [place] - [address] Phone:  [phone]  Fax:  [phone]      patient is completely out of medication.

## 2019-09-30 NOTE — Addendum Note (Signed)
Addended by: Davis Gourd on: 09/30/2019 04:46 PM   Modules accepted: Orders

## 2019-09-30 NOTE — Telephone Encounter (Signed)
Medication filled to pharmacy as requested.   

## 2019-10-01 ENCOUNTER — Encounter: Payer: Self-pay | Admitting: Family Medicine

## 2019-10-01 DIAGNOSIS — M300 Polyarteritis nodosa: Principal | ICD-10-CM

## 2019-10-01 MED ORDER — PENTOXIFYLLINE ER 400 MG TABLET,EXTENDED RELEASE
ORAL_TABLET | Freq: Three times a day (TID) | ORAL | 4 refills | 30.00000 days | Status: CP
Start: 2019-10-01 — End: ?

## 2019-10-01 NOTE — Unmapped (Signed)
Patient is requesting a refill on Pentoxifylline 400mg  CR. LOV 09/2019, medication was last filled 09/27/2019 with no more refills. Request pended at this time. Please advise.

## 2019-10-02 DIAGNOSIS — G4733 Obstructive sleep apnea (adult) (pediatric): Secondary | ICD-10-CM | POA: Diagnosis not present

## 2019-10-03 ENCOUNTER — Other Ambulatory Visit: Payer: Self-pay

## 2019-10-03 ENCOUNTER — Telehealth (INDEPENDENT_AMBULATORY_CARE_PROVIDER_SITE_OTHER): Payer: Medicare HMO | Admitting: Family Medicine

## 2019-10-03 ENCOUNTER — Encounter: Payer: Self-pay | Admitting: Family Medicine

## 2019-10-03 VITALS — BP 143/98 | HR 76 | Ht 62.0 in | Wt 343.0 lb

## 2019-10-03 DIAGNOSIS — F32A Depression, unspecified: Secondary | ICD-10-CM

## 2019-10-03 DIAGNOSIS — F419 Anxiety disorder, unspecified: Secondary | ICD-10-CM

## 2019-10-03 DIAGNOSIS — F329 Major depressive disorder, single episode, unspecified: Secondary | ICD-10-CM

## 2019-10-03 MED ORDER — BUPROPION HCL ER (XL) 300 MG PO TB24
300.0000 mg | ORAL_TABLET | Freq: Every day | ORAL | 3 refills | Status: DC
Start: 2019-10-03 — End: 2020-01-07

## 2019-10-03 NOTE — Progress Notes (Signed)
I have discussed the procedure for the virtual visit with the patient who has given consent to proceed with assessment and treatment.   Pt scored a 21 on depression screen  Davis Gourd, CMA

## 2019-10-03 NOTE — Progress Notes (Signed)
Virtual Visit via Video   I connected with patient on 10/03/19 at 11:00 AM EDT by a video enabled telemedicine application and verified that I am speaking with the correct person using two identifiers.  Location patient: Home Location provider: Acupuncturist, Office Persons participating in the virtual visit: Patient, Provider, [place] (Jess B)  I discussed the limitations of evaluation and management by telemedicine and the availability of in person appointments. The patient expressed understanding and agreed to proceed.  Subjective:   HPI:   Anxiety/depression- currently on Abilify 10mg  daily, Wellbutrin 150mg  daily, Cymbalta 120mg  daily.  Was previously on Buspar 30mg  BID.  Pt scored 21 on PHQ9.  Pt does not feel the Buspar was helpful.  Denies any specific trigger.  Has difficulty sleeping at night despite Clonazepam.  Continues to do grief counseling w/ Hospice.  Signed up for a seminar tonight about Mindfulness.    ROS:   See pertinent positives and negatives per HPI.  Patient Active Problem List   Diagnosis Date Noted  . Hyponatremia 07/27/2019  . Sepsis ([place]) 07/26/2019  . Urinary urgency 04/04/2018  . Chronic narcotic use 09/07/2016  . Acute blood loss anemia 01/14/2016  . Multiple gastric ulcers   . PAN (polyarteritis nodosa) ([place]) 11/24/2015  . GERD (gastroesophageal reflux disease) 01/08/2014  . Routine general medical examination at a health care facility 04/23/2013  . Bariatric surgery status 10/31/2012  . Psoriasis 01/09/2012  . DDD (degenerative disc disease), lumbar 11/30/2011  . Migraine headache   . [name] (obstructive sleep apnea) 11/16/2010  . HYPERGLYCEMIA, FASTING 10/08/2009  . CONTRACTURE OF TENDON 08/17/2009  . PARESTHESIA, HANDS 12/23/2008  . Leg pain, right 11/05/2008  . ADVERSE DRUG REACTION 04/03/2008  . PERIPHERAL EDEMA 03/26/2008  . Morbid obesity ([place]) 01/03/2007  . Cellulitis 01/03/2007  . Hyperlipidemia 09/22/2006  . Fibromyalgia  09/20/2006  . Anxiety and depression 06/28/2006    Social History   Tobacco Use  . Smoking status: Never Smoker  . Smokeless tobacco: Never Used  Substance Use Topics  . Alcohol use: No    Current Outpatient Medications:  .  amoxicillin (AMOXIL) 875 MG tablet, Take 1 tablet (875 mg total) by mouth 2 (two) times daily., Disp: 20 tablet, Rfl: 0 .  ARIPiprazole (ABILIFY) 10 MG tablet, Take 10 mg by mouth daily., Disp: , Rfl:  .  Biotin 10 MG TABS, Take 10 mg by mouth daily., Disp: , Rfl:  .  buPROPion (WELLBUTRIN XL) 150 MG 24 hr tablet, TAKE 1 TABLET BY MOUTH DAILY, Disp: 30 tablet, Rfl: 3 .  BYSTOLIC 10 MG tablet, TAKE 1 TABLET BY MOUTH EVERY DAY, Disp: 90 tablet, Rfl: 0 .  calcium citrate (CALCITRATE - DOSED IN MG ELEMENTAL CALCIUM) 950 MG tablet, Take 1 tablet by mouth daily., Disp: , Rfl:  .  clobetasol cream (TEMOVATE) [id number] %, Apply 1 application topically 2 (two) times daily. Cover both legs with Colbetasol cream, Disp: 30 g, Rfl: 0 .  clonazePAM (KLONOPIN) 1 MG tablet, TAKE 1 TABLET BY MOUTH EVERY DAY (Patient taking differently: Take 1 mg by mouth at bedtime. ), Disp: 30 tablet, Rfl: 3 .  collagenase (SANTYL) ointment, Apply topically daily. apply Santyl to scabs on legs, Disp: 15 g, Rfl: 0 .  cyclobenzaprine (FLEXERIL) 10 MG tablet, TAKE 1 TABLET BY MOUTH 3 TIMES DAILY AS NEEDED (Patient taking differently: Take 10 mg by mouth 3 (three) times daily as needed for muscle spasms. ), Disp: 270 tablet, Rfl: 0 .  DULoxetine (CYMBALTA) 60  MG capsule, Take 2 capsules (120 mg total) by mouth daily., Disp: 240 capsule, Rfl: 2 .  eletriptan (RELPAX) 40 MG tablet, Take 1 tablet (40 mg total) by mouth as needed for migraine. may repeat in 2 hours if necessary, Disp: 15 tablet, Rfl: 3 .  FEROSUL 325 (65 Fe) MG tablet, TAKE 1 TABLET BY MOUTH EVERY DAY WITH BREAKFAST (Patient taking differently: Take 325 mg by mouth daily with breakfast. ), Disp: 30 tablet, Rfl: 6 .  furosemide (LASIX) 80 MG tablet,  TAKE 1 TABLET BY MOUTH DAILY. TAKE one extra TABLET IF weight INCREASE by 5 lbs AND call your doctor FOR further instruction, Disp: 30 tablet, Rfl: 0 .  gabapentin (NEURONTIN) 600 MG tablet, Take 2 tablets (1,200 mg total) by mouth 3 (three) times daily., Disp: 270 tablet, Rfl: 0 .  HYDROcodone-acetaminophen (NORCO) 10-325 MG tablet, Take 1 tablet by mouth every 6 (six) hours as needed., Disp: 90 tablet, Rfl: 0 .  irbesartan (AVAPRO) 75 MG tablet, Take 1 tablet (75 mg total) by mouth daily., Disp: 30 tablet, Rfl: 0 .  meclizine (ANTIVERT) 25 MG tablet, Take 1 tablet (25 mg total) by mouth 3 (three) times daily as needed for dizziness., Disp: 30 tablet, Rfl: 0 .  NYSTATIN powder, APPLY 4 grams TO THE AFFECTED AREA 4 TIMES DAILY (Patient taking differently: Apply 1 application topically 4 (four) times daily. ), Disp: 60 g, Rfl: 3 .  Oxcarbazepine (TRILEPTAL) 300 MG tablet, Take 300 mg by mouth 2 (two) times daily., Disp: , Rfl:  .  pantoprazole (PROTONIX) 40 MG tablet, TAKE 1 TABLET BY MOUTH 2 TIMES DAILY, Disp: 180 tablet, Rfl: 1 .  pentoxifylline (TRENTAL) 400 MG CR tablet, Take 400 mg by mouth 3 (three) times daily with meals. , Disp: , Rfl: 11 .  promethazine (PHENERGAN) 25 MG tablet, TAKE 1 TABLET BY MOUTH EVERY 6 HOURS AS NEEDED FOR NAUSEA AND VOMITING, Disp: 45 tablet, Rfl: 3 .  rosuvastatin (CRESTOR) 20 MG tablet, TAKE 1 TABLET BY MOUTH EVERY DAY, Disp: 90 tablet, Rfl: 1 .  senna-docusate (SENOKOT-S) 8.6-50 MG tablet, Take 1 tablet by mouth at bedtime., Disp: 30 tablet, Rfl: 0 .  SKYRIZI, 150 MG DOSE, 75 MG/0.83ML PSKT, Inject 150 mg into the skin every 3 (three) months. , Disp: , Rfl:   Allergies  Allergen Reactions  . Niacin Anaphylaxis, Swelling and Other (See Comments)    REACTION: Swelling, problems breathing  . Sulfamethoxazole-Trimethoprim Anaphylaxis, Swelling, Rash and Other (See Comments)    REACTION: Rash, throat closed, eyes swelled.  . Aspirin Hives  . Bactrim Hives  .  Benzoin Compound Rash  . Cephalexin Hives    Tolerated ceftriaxone and cefepime 07/2019  . Clindamycin/Lincomycin Hives  . Doxycycline Hives  . Iohexol Hives    Pt treated with PO benedryl  . Lisinopril Hives  . Naproxen Hives  . Pnu-Imune [Pneumococcal Polysaccharide Vaccine] Rash  . Sulfonamide Derivatives Rash    Objective:   BP (!) 143/98   Pulse 76   Ht 5\' 2"  (1.575 m)   Wt (!) 343 lb (155.6 kg)   BMI 62.74 kg/m   AAOx3, NAD Obese NCAT, EOMI No obvious CN deficits Coloring WNL Pt is able to speak clearly, coherently without shortness of breath or increased work of breathing.  Thought process is linear.  Mood is appropriate.   Assessment and Plan:   Anxiety/Depression- deteriorated.  Pt's PHQ9 score 21.  She is already on Abilify, Cymbalta, Wellbutrin, and Clonopin.  She did  not feel that Buspar was helpful.  Will increase the Wellbutrin to 300mg  daily.  Encouraged her to continue grief counseling and applauded her efforts at signing up for the mindfulness seminar.  Will follow closely.  Pt expressed understanding and is in agreement w/ plan.    [name], MD 10/03/2019

## 2019-10-08 ENCOUNTER — Other Ambulatory Visit: Payer: Self-pay | Admitting: Family Medicine

## 2019-10-08 ENCOUNTER — Encounter: Payer: Self-pay | Admitting: Family Medicine

## 2019-10-08 NOTE — Telephone Encounter (Signed)
Last OV 10/03/19 Hydrocodone last filled 09/16/19 #90 with 0

## 2019-10-08 NOTE — Telephone Encounter (Signed)
These are duplicates

## 2019-10-09 ENCOUNTER — Other Ambulatory Visit: Payer: Self-pay | Admitting: Family Medicine

## 2019-10-10 NOTE — Telephone Encounter (Signed)
Called pt and advised of PCP recommendations she states an understanding.

## 2019-10-10 NOTE — Telephone Encounter (Signed)
Pt called in checking on this. Please advise

## 2019-10-10 NOTE — Telephone Encounter (Signed)
Please let pt know meds were filled on 5/3 and in the past 3 days I have received 4 electronic requests, multiple paper requests, and now phone calls.  These medications are not due until Wednesday.  Since they are controlled, I will not be filling until tomorrow.  In the future, multiple requests for medication will just result in delays as this slows down the process when I have to address multiple of the same prescription

## 2019-10-10 NOTE — Telephone Encounter (Signed)
3rd request

## 2019-10-11 MED ORDER — HYDROCODONE-ACETAMINOPHEN 10-325 MG PO TABS: 1.0000 | ORAL_TABLET | Freq: Four times a day (QID) | ORAL | 0 refills | Status: DC | PRN

## 2019-10-15 ENCOUNTER — Other Ambulatory Visit: Payer: Self-pay | Admitting: Family Medicine

## 2019-10-15 NOTE — Unmapped (Signed)
Autumn Mcdonald reports things are going well with her Skyrizi. She's had no recent flares, and no need for topicals. No infections since cellulitis in March - she was hospitalized, but this resolved with antibiotics.     [facility] Clinical Assessment & Refill Coordination Note    Autumn Mcdonald, DOB: May 12, 1971  Phone: [phone] (home)     All above HIPAA information was verified with patient.     Was a Nurse, learning disability used for this call? No    Specialty Medication(s):   Inflammatory Disorders: Skyrizi     Current Outpatient Medications   Medication Sig Dispense Refill   ??? ARIPiprazole (ABILIFY) 10 MG tablet Take 10 mg by mouth daily.     ??? armodafinil (NUVIGIL) 250 mg tablet Take 250 mg by mouth three (3) times a day.      ??? betamethasone dipropionate (DIPROLENE) 0.05 % ointment Apply topically Two (2) times a day. To affected areas as needed 60 g 5   ??? biotin 10 mg Tab 10 mg per 1 tablet, ORAL, Daily, 0 Refill(s)     ??? buPROPion (WELLBUTRIN XL) 150 MG 24 hr tablet Take 150 mg by mouth.     ??? busPIRone (BUSPAR) 30 MG tablet Take 30 mg by mouth.     ??? calcium citrate (CALCITRATE) 200 mg elem calcium (950 mg) tablet 950 mg per 1 tablet, ORAL, Daily, 60 tablet, 0 Refill(s)     ??? clonazePAM (KLONOPIN) 1 MG tablet TAKE 1 TABLET BY MOUTH EVERY DAY     ??? cyclobenzaprine (FLEXERIL) 10 MG tablet Take 10 mg by mouth once as needed.      ??? diclofenac sodium (VOLTAREN) 1 % gel Apply 2-4 g topically Four (4) times a day. 300 g 11   ??? DULoxetine (CYMBALTA) 60 MG capsule Take 60 mg by mouth daily. 120 mg daily     ??? eletriptan (RELPAX) 40 MG tablet Take 40 mg by mouth.     ??? ferrous sulfate 325 (65 FE) MG tablet Take 1 tablet (325 mg total) by mouth daily with breakfast.  6   ??? fluconazole (DIFLUCAN) 100 MG tablet Take 100 mg by mouth.     ??? furosemide (LASIX) 80 MG tablet TAKE 1 TABLET BY MOUTH DAILY. can TAKE 1 extra TABLET IF weigh INCREASE by 5lb AND call your doctor FOR further instruction     ??? gabapentin (NEURONTIN) 600 MG tablet Take 2 tablets (1,200 mg total) by mouth Three (3) times a day. 90 tablet 3   ??? nebivolol (BYSTOLIC) 10 MG tablet Take 10 mg by mouth daily.      ??? NYSTOP 100,000 unit/gram powder APPLY 4 grams TO THE AFFECTED AREA 4 TIMES DAILY     ??? OXcarbazepine (TRILEPTAL) 300 MG tablet Take 300 mg by mouth.     ??? pantoprazole (PROTONIX) 40 MG tablet Take 40 mg by mouth Two (2) times a day. bid     ??? pentoxifylline (TRENTAL) 400 mg CR tablet Take 1 tablet (400 mg total) by mouth Three (3) times a day with a meal. 90 tablet 4   ??? promethazine (PHENERGAN) 25 MG tablet once as needed.      ??? risankizumab-rzaa (SKYRIZI) 150mg /1.80mL(75 mg/0.83 mL x2) SyKt Inject the contents of two syringes (150 mg total) under the skin every 12 weeks. 1 each 4   ??? rosuvastatin (CRESTOR) 20 MG tablet TAKE 1 TABLET BY MOUTH EVERY DAY       No current facility-administered medications  for this visit.        Changes to medications: Autumn Mcdonald reports no changes at this time.    Allergies   Allergen Reactions   ??? Iohexol Hives     Pt treated with PO benedryl   ??? Keflex [Cephalexin] Shortness Of Breath and Hives   ??? Lincomycin Hcl Hives   ??? Lisinopril Hives     Hives  Hives   ??? Sulfa (Sulfonamide Antibiotics) Swelling and Rash   ??? Sulfamethoxazole-Trimethoprim Hives     REACTION: Rash, throat closed, eyes swelled.  Hives  Hives   ??? Aspirin      REACTION: Hives  High Doses only. Causes rash.  High Doses only. Causes rash.   ??? Clindamycin      Hives  Hives   ??? Doxycycline      REACTION: Hives  Hives  Hives   ??? Naproxen      REACTION: Hives  Eyes well  Eyes well   ??? Niacin      REACTION: Swelling, problems breathing   ??? Benzoin Compound Rash   ??? Pneumococcal 23-Val Ps Vaccine Rash     cellulitis       Changes to allergies: No    SPECIALTY MEDICATION ADHERENCE     Skyrizi - 0 left  Medication Adherence    Patient reported X missed doses in the last month: 0  Specialty Medication: Skyrizi  Patient is on additional specialty medications: No          Specialty medication(s) dose(s) confirmed: Regimen is correct and unchanged.     Are there any concerns with adherence? No    Adherence counseling provided? Not needed    CLINICAL MANAGEMENT AND INTERVENTION      Clinical Benefit Assessment:    Do you feel the medicine is effective or helping your condition? Yes    Clinical Benefit counseling provided? Not needed    Adverse Effects Assessment:    Are you experiencing any side effects? No    Are you experiencing difficulty administering your medicine? No    Quality of Life Assessment:    How many days over the past month did your psoriasis  keep you from your normal activities? For example, brushing your teeth or getting up in the morning. 0    Have you discussed this with your provider? Not needed    Therapy Appropriateness:    Is therapy appropriate? Yes, therapy is appropriate and should be continued    DISEASE/MEDICATION-SPECIFIC INFORMATION      For patients on injectable medications: Patient currently has 0 doses left.  Next injection is scheduled for 6/11.    PATIENT SPECIFIC NEEDS     - Does the patient have any physical, cognitive, or cultural barriers? No    - Is the patient high risk? No     - Does the patient require a Care Management Plan? No     - Does the patient require physician intervention or other additional services (i.e. nutrition, smoking cessation, social work)? No      SHIPPING     Specialty Medication(s) to be Shipped:   Inflammatory Disorders: Skyrizi    Other medication(s) to be shipped: na     Changes to insurance: No    Delivery Scheduled: Yes, Expected medication delivery date: Tues, June 8.     Medication will be delivered via UPS to the confirmed prescription address in [facility].    The patient will receive a drug information handout for each medication  shipped and additional FDA Medication Guides as required.  Verified that patient has previously received a Conservation officer, historic buildings.    All of the patient's questions and concerns have been addressed.    [name]   [facility] Specialty Pharmacist

## 2019-10-15 NOTE — Telephone Encounter (Signed)
Last OV 10/03/19 Clonazepam last filled 06/26/19 #30 with 3

## 2019-10-21 MED FILL — SKYRIZI 150 MG/1.66 ML(75 MG/0.83 ML X 2) SUBCUTANEOUS SYRINGE KIT: 30 days supply | Qty: 1 | Fill #0 | Status: AC

## 2019-10-23 ENCOUNTER — Other Ambulatory Visit: Payer: Self-pay | Admitting: Family Medicine

## 2019-10-30 ENCOUNTER — Other Ambulatory Visit: Payer: Self-pay | Admitting: Family Medicine

## 2019-10-30 NOTE — Telephone Encounter (Signed)
Last OV 10/03/19 Hydrocodone last filled 10/11/19 #90 with 0

## 2019-11-02 DIAGNOSIS — G4733 Obstructive sleep apnea (adult) (pediatric): Secondary | ICD-10-CM | POA: Diagnosis not present

## 2019-11-04 ENCOUNTER — Other Ambulatory Visit: Payer: Self-pay | Admitting: Family Medicine

## 2019-11-05 ENCOUNTER — Other Ambulatory Visit: Payer: Self-pay | Admitting: Family Medicine

## 2019-11-05 ENCOUNTER — Encounter: Payer: Medicare Other | Admitting: Obstetrics and Gynecology

## 2019-11-05 NOTE — Telephone Encounter (Signed)
Please advise 

## 2019-11-06 ENCOUNTER — Other Ambulatory Visit: Payer: Self-pay | Admitting: Family Medicine

## 2019-11-06 NOTE — Telephone Encounter (Signed)
Is pt to be on this still?

## 2019-11-07 ENCOUNTER — Encounter: Payer: Self-pay | Admitting: Family Medicine

## 2019-11-07 ENCOUNTER — Other Ambulatory Visit: Payer: Self-pay

## 2019-11-07 ENCOUNTER — Telehealth (INDEPENDENT_AMBULATORY_CARE_PROVIDER_SITE_OTHER): Payer: Medicare HMO | Admitting: Family Medicine

## 2019-11-07 VITALS — Ht 62.5 in | Wt 338.0 lb

## 2019-11-07 DIAGNOSIS — B369 Superficial mycosis, unspecified: Secondary | ICD-10-CM | POA: Diagnosis not present

## 2019-11-07 DIAGNOSIS — L989 Disorder of the skin and subcutaneous tissue, unspecified: Secondary | ICD-10-CM | POA: Diagnosis not present

## 2019-11-07 MED ORDER — NYSTATIN 100000 UNIT/GM EX POWD
1.0000 | Freq: Three times a day (TID) | CUTANEOUS | 3 refills | Status: DC
Start: 2019-11-07 — End: 2020-03-17

## 2019-11-07 MED ORDER — FLUCONAZOLE 100 MG PO TABS: 100.0000 mg | ORAL_TABLET | Freq: Every day | ORAL | 0 refills | Status: DC

## 2019-11-07 MED ORDER — MUPIROCIN 2 % EX OINT: 1.0000 "application " | TOPICAL_OINTMENT | Freq: Two times a day (BID) | CUTANEOUS | 1 refills | Status: DC

## 2019-11-07 NOTE — Progress Notes (Signed)
Virtual Visit via Video   I connected with patient on 11/07/19 at  2:00 PM EDT by a video enabled telemedicine application and verified that I am speaking with the correct person using two identifiers.  Location patient: Home Location provider: Acupuncturist, Office Persons participating in the virtual visit: Patient, Provider, [place] (Jess B)  I discussed the limitations of evaluation and management by telemedicine and the availability of in person appointments. The patient expressed understanding and agreed to proceed.  Subjective:   HPI:   Rash under my belly- red, similar to previous yeast dermatitis.  Has been using powder since last week but it is worsening rather than improving.  Painful.  Currently only on L side.  + strong odor despite showering multiple times/day.    Sores- pt reports 10 small open areas on 1 thigh and 5 areas on the other.  Not red or warm.  Hx of cellulitis.  Has been cleaning regularly.    ROS:   See pertinent positives and negatives per HPI.  Patient Active Problem List   Diagnosis Date Noted  . Hyponatremia 07/27/2019  . Sepsis ([place]) 07/26/2019  . Urinary urgency 04/04/2018  . Chronic narcotic use 09/07/2016  . Acute blood loss anemia 01/14/2016  . Multiple gastric ulcers   . PAN (polyarteritis nodosa) ([place]) 11/24/2015  . GERD (gastroesophageal reflux disease) 01/08/2014  . Routine general medical examination at a health care facility 04/23/2013  . Bariatric surgery status 10/31/2012  . Psoriasis 01/09/2012  . DDD (degenerative disc disease), lumbar 11/30/2011  . Migraine headache   . [name] (obstructive sleep apnea) 11/16/2010  . HYPERGLYCEMIA, FASTING 10/08/2009  . CONTRACTURE OF TENDON 08/17/2009  . PARESTHESIA, HANDS 12/23/2008  . Leg pain, right 11/05/2008  . ADVERSE DRUG REACTION 04/03/2008  . PERIPHERAL EDEMA 03/26/2008  . Morbid obesity ([place]) 01/03/2007  . Cellulitis 01/03/2007  . Hyperlipidemia 09/22/2006  . Fibromyalgia  09/20/2006  . Anxiety and depression 06/28/2006    Social History   Tobacco Use  . Smoking status: Never Smoker  . Smokeless tobacco: Never Used  Substance Use Topics  . Alcohol use: No    Current Outpatient Medications:  .  ARIPiprazole (ABILIFY) 10 MG tablet, Take 10 mg by mouth daily., Disp: , Rfl:  .  Biotin 10 MG TABS, Take 10 mg by mouth daily., Disp: , Rfl:  .  buPROPion (WELLBUTRIN XL) 300 MG 24 hr tablet, Take 1 tablet (300 mg total) by mouth daily., Disp: 30 tablet, Rfl: 3 .  busPIRone (BUSPAR) 30 MG tablet, , Disp: , Rfl:  .  BYSTOLIC 10 MG tablet, TAKE 1 TABLET BY MOUTH EVERY DAY, Disp: 90 tablet, Rfl: 0 .  calcium citrate (CALCITRATE - DOSED IN MG ELEMENTAL CALCIUM) 950 MG tablet, Take 1 tablet by mouth daily., Disp: , Rfl:  .  clobetasol cream (TEMOVATE) 0.53 %, Apply 1 application topically 2 (two) times daily. Cover both legs with Colbetasol cream, Disp: 30 g, Rfl: 0 .  clonazePAM (KLONOPIN) 1 MG tablet, TAKE 1 TABLET BY MOUTH EVERY DAY, Disp: 30 tablet, Rfl: 3 .  collagenase (SANTYL) ointment, Apply topically daily. apply Santyl to scabs on legs, Disp: 15 g, Rfl: 0 .  cyclobenzaprine (FLEXERIL) 10 MG tablet, TAKE 1 TABLET BY MOUTH 3 TIMES DAILY AS NEEDED, Disp: 270 tablet, Rfl: 0 .  DULoxetine (CYMBALTA) 60 MG capsule, Take 2 capsules (120 mg total) by mouth daily., Disp: 240 capsule, Rfl: 2 .  eletriptan (RELPAX) 40 MG tablet, Take 1 tablet (40  mg total) by mouth as needed for migraine. may repeat in 2 hours if necessary, Disp: 15 tablet, Rfl: 3 .  FEROSUL 325 (65 Fe) MG tablet, TAKE 1 TABLET BY MOUTH EVERY DAY WITH BREAKFAST, Disp: 30 tablet, Rfl: 6 .  furosemide (LASIX) 80 MG tablet, TAKE 1 TABLET BY MOUTH DAILY. can TAKE 1 extra TABLET IF weigh INCREASE by 5lb AND call your doctor FOR further instruction, Disp: 30 tablet, Rfl: 0 .  gabapentin (NEURONTIN) 600 MG tablet, TAKE 2 TABLETS BY MOUTH 3 TIMES DAILY, Disp: 270 tablet, Rfl: 0 .  HYDROcodone-acetaminophen (NORCO)  10-325 MG tablet, TAKE 1 TABLET BY MOUTH EVERY 6 HOURS AS NEEDED, Disp: 90 tablet, Rfl: 0 .  irbesartan (AVAPRO) 75 MG tablet, TAKE 1 TABLET BY MOUTH EVERY DAY, Disp: 30 tablet, Rfl: 0 .  meclizine (ANTIVERT) 25 MG tablet, Take 1 tablet (25 mg total) by mouth 3 (three) times daily as needed for dizziness., Disp: 30 tablet, Rfl: 0 .  NYSTATIN powder, APPLY 4 grams TO THE AFFECTED AREA 4 TIMES DAILY (Patient taking differently: Apply 1 application topically 4 (four) times daily. ), Disp: 60 g, Rfl: 3 .  Oxcarbazepine (TRILEPTAL) 300 MG tablet, Take 300 mg by mouth 2 (two) times daily., Disp: , Rfl:  .  pantoprazole (PROTONIX) 40 MG tablet, TAKE 1 TABLET BY MOUTH 2 TIMES DAILY, Disp: 180 tablet, Rfl: 1 .  pentoxifylline (TRENTAL) 400 MG CR tablet, Take 400 mg by mouth 3 (three) times daily with meals. , Disp: , Rfl: 11 .  promethazine (PHENERGAN) 25 MG tablet, TAKE 1 TABLET BY MOUTH EVERY 6 HOURS AS NEEDED FOR NAUSEA AND VOMITING, Disp: 45 tablet, Rfl: 3 .  rosuvastatin (CRESTOR) 20 MG tablet, TAKE 1 TABLET BY MOUTH EVERY DAY, Disp: 90 tablet, Rfl: 1 .  senna-docusate (SENOKOT-S) 8.6-50 MG tablet, Take 1 tablet by mouth at bedtime., Disp: 30 tablet, Rfl: 0 .  SKYRIZI, 150 MG DOSE, 75 MG/0.83ML PSKT, Inject 150 mg into the skin every 3 (three) months. , Disp: , Rfl:   Allergies  Allergen Reactions  . Niacin Anaphylaxis, Swelling and Other (See Comments)    REACTION: Swelling, problems breathing  . Sulfamethoxazole-Trimethoprim Anaphylaxis, Swelling, Rash and Other (See Comments)    REACTION: Rash, throat closed, eyes swelled.  . Aspirin Hives  . Bactrim Hives  . Benzoin Compound Rash  . Cephalexin Hives    Tolerated ceftriaxone and cefepime 07/2019  . Clindamycin/Lincomycin Hives  . Doxycycline Hives  . Iohexol Hives    Pt treated with PO benedryl  . Lisinopril Hives  . Naproxen Hives  . Pnu-Imune [Pneumococcal Polysaccharide Vaccine] Rash  . Sulfonamide Derivatives Rash    Objective:    Ht 5' 2.5" (1.588 m)   Wt (!) 338 lb (153.3 kg)   BMI 60.84 kg/m   AAOx3, NAD Obese NCAT, EOMI No obvious CN deficits Coloring WNL Pt is able to speak clearly, coherently without shortness of breath or increased work of breathing.  Thought process is linear.  Mood is appropriate.   Assessment and Plan:   Fungal dermatitis- deteriorated.  Pt has hx of this and typically controls w/ OTC powder but reports area is now painful and has a strong odor.  Start Nystatin powder and add oral Diflucan x7 days.  Pt expressed understanding and is in agreement w/ plan.   Skin lesions- pt has hx of sores on her legs and previously these have become infected.  She reports that they are not currently red or warm  but fears they will get infected.  Start topical mupirocin BID.  Pt expressed understanding and is in agreement w/ plan.    [name], MD 11/07/2019

## 2019-11-07 NOTE — Progress Notes (Signed)
I have discussed the procedure for the virtual visit with the patient who has given consent to proceed with assessment and treatment.   [name] [name], CMA     

## 2019-11-08 ENCOUNTER — Telehealth: Payer: Medicare HMO | Admitting: Family Medicine

## 2019-11-08 ENCOUNTER — Encounter: Payer: Medicare HMO | Admitting: Obstetrics and Gynecology

## 2019-11-12 DIAGNOSIS — M300 Polyarteritis nodosa: Principal | ICD-10-CM

## 2019-11-12 MED ORDER — PENTOXIFYLLINE ER 400 MG TABLET,EXTENDED RELEASE
ORAL_TABLET | Freq: Three times a day (TID) | ORAL | 4 refills | 30.00000 days | Status: CP
Start: 2019-11-12 — End: ?

## 2019-11-12 NOTE — Unmapped (Signed)
Refill for Pentoxifylline

## 2019-11-14 ENCOUNTER — Encounter: Payer: Self-pay | Admitting: Family Medicine

## 2019-11-14 ENCOUNTER — Telehealth: Payer: Self-pay

## 2019-11-14 NOTE — Telephone Encounter (Signed)
Patient called to ask about being prescribed HRT.  I told her she would need to schedule visit. She has CE scheduled 11/27/19 and I offered to move it earlier however she is experiencing a lot of discomfort with a fungal infection on her lower abd and states she cannot be touched there.  She wants to make sooner RG appt to come discuss. Appt scheduled.

## 2019-11-15 ENCOUNTER — Encounter: Payer: Medicare HMO | Admitting: Obstetrics and Gynecology

## 2019-11-15 ENCOUNTER — Ambulatory Visit: Payer: Medicare HMO | Admitting: Obstetrics and Gynecology

## 2019-11-15 MED ORDER — FLUCONAZOLE 100 MG PO TABS: 100.0000 mg | ORAL_TABLET | Freq: Every day | ORAL | 0 refills | Status: DC

## 2019-11-21 ENCOUNTER — Other Ambulatory Visit: Payer: Self-pay | Admitting: Family Medicine

## 2019-11-26 ENCOUNTER — Encounter: Payer: Self-pay | Admitting: Family Medicine

## 2019-11-28 ENCOUNTER — Encounter: Payer: Self-pay | Admitting: Family Medicine

## 2019-11-29 ENCOUNTER — Ambulatory Visit: Payer: Medicare HMO | Admitting: Obstetrics and Gynecology

## 2019-12-02 ENCOUNTER — Other Ambulatory Visit: Payer: Self-pay | Admitting: Family Medicine

## 2019-12-02 DIAGNOSIS — G4733 Obstructive sleep apnea (adult) (pediatric): Secondary | ICD-10-CM | POA: Diagnosis not present

## 2019-12-02 NOTE — Telephone Encounter (Signed)
Last OV 11/07/19 Hydrocodone last filled 11/04/19 #90 with 0

## 2019-12-03 ENCOUNTER — Other Ambulatory Visit: Payer: Self-pay | Admitting: Family Medicine

## 2019-12-04 ENCOUNTER — Encounter: Payer: Self-pay | Admitting: Family Medicine

## 2019-12-07 ENCOUNTER — Other Ambulatory Visit: Payer: Self-pay | Admitting: Family Medicine

## 2019-12-11 ENCOUNTER — Other Ambulatory Visit: Payer: Self-pay

## 2019-12-11 ENCOUNTER — Encounter: Payer: Self-pay | Admitting: Family Medicine

## 2019-12-11 ENCOUNTER — Ambulatory Visit (INDEPENDENT_AMBULATORY_CARE_PROVIDER_SITE_OTHER): Payer: Medicare HMO | Admitting: Family Medicine

## 2019-12-11 VITALS — BP 126/79 | HR 82 | Temp 98.4°F | Resp 16 | Ht 63.0 in | Wt 353.0 lb

## 2019-12-11 DIAGNOSIS — R600 Localized edema: Secondary | ICD-10-CM

## 2019-12-11 DIAGNOSIS — L03119 Cellulitis of unspecified part of limb: Secondary | ICD-10-CM

## 2019-12-11 MED ORDER — CIPROFLOXACIN HCL 500 MG PO TABS: 500.0000 mg | ORAL_TABLET | Freq: Two times a day (BID) | ORAL | 0 refills | Status: DC

## 2019-12-11 NOTE — Assessment & Plan Note (Addendum)
Deteriorated.  Now w/ venous stasis changes and ulceration.  None of the ulcers appear to be infected but she does have overlying redness and warmth of both lower legs.  Discussed need to use her fluid pills, elevate, and wrap her legs.  Will follow.

## 2019-12-11 NOTE — Assessment & Plan Note (Signed)
Recurrent problem for pt.  Given the redness of warmth of both lower legs and her recent hospitalization for sepsis, will start Cipro BID x5 days (pt w/ multiple allergies which make prescribing abx difficult).  Pt is to monitor for changes or worsening of sxs.  Will follow.

## 2019-12-11 NOTE — Progress Notes (Signed)
   Subjective:    Patient ID: [name], female    DOB: 09/16/1970, 49 y.o.   MRN: [id number]  HPI Leg sores- pt has hx of similar and was admitted previously w/ sepsis.  Pt reports current sores started oozing on Friday.  No pain.  Currently sores are located from knees down.  Pt has hx of cellulitis.  Not warm to touch but 'my legs are always red now'.  Pt has taken Lasix- took extra pill today.  Is not able to wear compression socks due to size.  Pt has Derm at [facility] see HPI   This visit occurred during the SARS-CoV-2 public health emergency.  Safety protocols were in place, including screening questions prior to the visit, additional usage of staff PPE, and extensive cleaning of exam room while observing appropriate contact time as indicated for disinfecting solutions.       Objective:   Physical Exam Vitals reviewed.  Constitutional:      General: She is not in acute distress.    Appearance: She is obese. She is not ill-appearing or toxic-appearing.  Musculoskeletal:     Right lower leg: Edema (1+ pitting edema w/ fluid oozing down lower leg) present.     Left lower leg: Edema (trace pitting edema) present.  Skin:    General: Skin is warm.     Findings: Erythema and lesion (pt with multiple ulcerations of lower legs bilaterally.  no pus or visible drainage.  + warmth and overlying redness) present.  Neurological:     General: No focal deficit present.     Mental Status: She is alert. Mental status is at baseline.  Psychiatric:        Mood and Affect: Mood normal.        Behavior: Behavior normal.        Thought Content: Thought content normal.           Assessment & Plan:

## 2019-12-11 NOTE — Patient Instructions (Signed)
Follow up as needed or as scheduled START the Cipro twice daily (w/ food) Make sure you are drinking water Elevate your legs when sitting Wrap your legs w/ ACE bandages to help w/ swelling Call with any questions or concerns Happy Belated Birthday!

## 2019-12-12 ENCOUNTER — Other Ambulatory Visit: Payer: Self-pay | Admitting: Family Medicine

## 2019-12-13 ENCOUNTER — Encounter: Payer: Self-pay | Admitting: Family Medicine

## 2019-12-19 ENCOUNTER — Other Ambulatory Visit: Payer: Self-pay | Admitting: Obstetrics and Gynecology

## 2019-12-19 DIAGNOSIS — Z1231 Encounter for screening mammogram for malignant neoplasm of breast: Secondary | ICD-10-CM

## 2019-12-20 ENCOUNTER — Encounter: Payer: Self-pay | Admitting: Family Medicine

## 2019-12-20 ENCOUNTER — Other Ambulatory Visit: Payer: Self-pay | Admitting: General Practice

## 2019-12-20 MED ORDER — HYDROCODONE-ACETAMINOPHEN 10-325 MG PO TABS: 1.0000 | ORAL_TABLET | Freq: Four times a day (QID) | ORAL | 0 refills | Status: DC | PRN

## 2019-12-20 NOTE — Telephone Encounter (Signed)
Spoke with pharmacy. They wanted to advise PCP that pt has taken all of her pain medication in a 16 day period. Rx was written for 23 days if SIG was followed.   Pharmacy stated that they canceled the Rx that was sent in today by PCP. In order to fill the medication to keep pt from withdrawing the SIG would have to say something that Med NEEDS to be filled today. Otherwise it cannot be filled at all for 5 days, as that would be day 21 of a 23 day prescription.

## 2019-12-23 ENCOUNTER — Other Ambulatory Visit: Payer: Self-pay | Admitting: Family Medicine

## 2019-12-23 NOTE — Telephone Encounter (Signed)
Please advise if refills should be given on these? Also please check the Gabapentin it appears it was filled for #45.

## 2019-12-24 ENCOUNTER — Other Ambulatory Visit: Payer: Self-pay | Admitting: Family Medicine

## 2019-12-24 NOTE — Telephone Encounter (Signed)
Ok for refill? 

## 2019-12-26 NOTE — Unmapped (Signed)
[facility] Refill Coordination Note    Specialty Medication(s) to be Shipped:   Inflammatory Disorders: Skyrizi    Other medication(s) to be shipped: No additional medications requested for fill at this time     [facility], DOB: May 02, 1971  Phone: [phone] (home)       All above HIPAA information was verified with patient.     Was a Nurse, learning disability used for this call? No    Completed refill call assessment today to schedule patient's medication shipment from the [facility] [phone]).       Specialty medication(s) and dose(s) confirmed: Regimen is correct and unchanged.   Changes to medications: [facility] reports no changes at this time.  Changes to insurance: No  Questions for the pharmacist: No    Confirmed patient received Welcome Packet with first shipment. The patient will receive a drug information handout for each medication shipped and additional FDA Medication Guides as required.       DISEASE/MEDICATION-SPECIFIC INFORMATION        For patients on injectable medications: Patient currently has 0 doses left.  Next injection is scheduled for 01/10/2020.    SPECIALTY MEDICATION ADHERENCE     Medication Adherence    Patient reported X missed doses in the last month: 0  Specialty Medication: Skyrizi 150 mg/1.66 ml   Patient is on additional specialty medications: No  Any gaps in refill history greater than 2 weeks in the last 3 months: no  Demonstrates understanding of importance of adherence: yes  Informant: patient  Reliability of informant: reliable  Confirmed plan for next specialty medication refill: delivery by pharmacy  Refills needed for supportive medications: not needed                      SHIPPING     Shipping address confirmed in Epic.     Delivery Scheduled: Yes, Expected medication delivery date: 01/01/2020.     Medication will be delivered via UPS to the prescription address in Epic WAM.    [facility]   [facility] Specialty Technician

## 2019-12-30 ENCOUNTER — Encounter: Payer: Self-pay | Admitting: Family Medicine

## 2019-12-31 ENCOUNTER — Other Ambulatory Visit: Payer: Self-pay | Admitting: Family Medicine

## 2019-12-31 MED ORDER — HYDROCODONE-ACETAMINOPHEN 10-325 MG PO TABS: 1.0000 | ORAL_TABLET | Freq: Four times a day (QID) | ORAL | 0 refills | Status: DC | PRN

## 2019-12-31 MED FILL — SKYRIZI 150 MG/1.66 ML(75 MG/0.83 ML X 2) SUBCUTANEOUS SYRINGE KIT: 84 days supply | Qty: 1 | Fill #1

## 2019-12-31 MED FILL — SKYRIZI 150 MG/1.66 ML(75 MG/0.83 ML X 2) SUBCUTANEOUS SYRINGE KIT: 84 days supply | Qty: 1 | Fill #1 | Status: AC

## 2019-12-31 NOTE — Telephone Encounter (Signed)
Please advise if ok for refill?

## 2020-01-02 DIAGNOSIS — G4733 Obstructive sleep apnea (adult) (pediatric): Secondary | ICD-10-CM | POA: Diagnosis not present

## 2020-01-07 ENCOUNTER — Other Ambulatory Visit: Payer: Self-pay | Admitting: Family Medicine

## 2020-01-09 ENCOUNTER — Ambulatory Visit: Payer: Medicare HMO

## 2020-01-09 ENCOUNTER — Other Ambulatory Visit: Payer: Self-pay

## 2020-01-09 ENCOUNTER — Ambulatory Visit
Admission: RE | Admit: 2020-01-09 | Discharge: 2020-01-09 | Disposition: A | Discharge status: Home / Self Care | Payer: Medicare HMO | Source: Ambulatory Visit | Attending: Obstetrics and Gynecology | Admitting: Obstetrics and Gynecology

## 2020-01-09 DIAGNOSIS — Z1231 Encounter for screening mammogram for malignant neoplasm of breast: Secondary | ICD-10-CM

## 2020-01-17 ENCOUNTER — Other Ambulatory Visit: Payer: Self-pay | Admitting: Family Medicine

## 2020-01-22 DIAGNOSIS — L409 Psoriasis, unspecified: Principal | ICD-10-CM

## 2020-01-22 MED ORDER — SKYRIZI 150 MG/1.66 ML(75 MG/0.83 ML X 2) SUBCUTANEOUS SYRINGE KIT
4 refills | 0 days
Start: 2020-01-22 — End: ?

## 2020-01-23 MED ORDER — SKYRIZI 150 MG/1.66 ML(75 MG/0.83 ML X 2) SUBCUTANEOUS SYRINGE KIT
SUBCUTANEOUS | 1 refills | 0.00000 days | Status: CP
Start: 2020-01-23 — End: ?

## 2020-01-24 NOTE — Unmapped (Signed)
Clinical Assessment Needed For: Formulation Change  Medication: Skyrizi 150mg /ml syringe  Last Fill Date: 12/31/2019  Copay $0  Was previous dose already scheduled to fill: No    Notes to Pharmacist:

## 2020-01-30 ENCOUNTER — Other Ambulatory Visit: Payer: Self-pay | Admitting: Family Medicine

## 2020-01-30 ENCOUNTER — Encounter: Payer: Self-pay | Admitting: Family Medicine

## 2020-01-30 MED ORDER — HYDROCODONE-ACETAMINOPHEN 10-325 MG PO TABS
1.0000 | ORAL_TABLET | Freq: Four times a day (QID) | ORAL | 0 refills | Status: DC | PRN
Start: 2020-01-30 — End: 2020-02-26

## 2020-01-30 NOTE — Telephone Encounter (Signed)
Last OV 12/11/19 Hydrocodone last filled 12/31/19 #90 with 0

## 2020-02-02 DIAGNOSIS — G4733 Obstructive sleep apnea (adult) (pediatric): Secondary | ICD-10-CM | POA: Diagnosis not present

## 2020-02-03 ENCOUNTER — Telehealth: Payer: Self-pay | Admitting: Family Medicine

## 2020-02-03 ENCOUNTER — Encounter: Payer: Self-pay | Admitting: Family Medicine

## 2020-02-03 MED ORDER — CIPROFLOXACIN HCL 500 MG PO TABS
500.0000 mg | ORAL_TABLET | Freq: Two times a day (BID) | ORAL | 0 refills | Status: DC
Start: 2020-02-03 — End: 2020-08-07

## 2020-02-03 NOTE — Chronic Care Management (AMB) (Signed)
  Chronic Care Management   Note  02/03/2020 Name: [name] NO MRN: [id number] DOB: 12-Sep-1970  [name] is a 49 y.o. year old female who is a primary care patient of Birdie Riddle, Aundra Millet, MD. I reached out to [name] by phone today in response to a referral sent by Ms. Helmut Muster Hoes's PCP, [name], MD.   Ms. Aslinger was given information about Chronic Care Management services today including:  1. CCM service includes personalized support from designated clinical staff supervised by her physician, including individualized plan of care and coordination with other care providers 2. 24/7 contact phone numbers for assistance for urgent and routine care needs. 3. Service will only be billed when office clinical staff spend 20 minutes or more in a month to coordinate care. 4. Only one practitioner may furnish and bill the service in a calendar month. 5. The patient may stop CCM services at any time (effective at the end of the month) by phone call to the office staff.   Patient agreed to services and verbal consent obtained.   Follow up plan:   Lauretta Grill Upstream Scheduler

## 2020-02-05 ENCOUNTER — Encounter: Admit: 2020-02-05 | Discharge: 2020-02-06 | Payer: MEDICARE

## 2020-02-05 ENCOUNTER — Other Ambulatory Visit: Payer: Self-pay | Admitting: Family Medicine

## 2020-02-05 DIAGNOSIS — M300 Polyarteritis nodosa: Principal | ICD-10-CM

## 2020-02-05 DIAGNOSIS — Z79899 Other long term (current) drug therapy: Principal | ICD-10-CM

## 2020-02-05 DIAGNOSIS — L409 Psoriasis, unspecified: Principal | ICD-10-CM

## 2020-02-05 MED ORDER — PENTOXIFYLLINE ER 400 MG TABLET,EXTENDED RELEASE
ORAL_TABLET | Freq: Three times a day (TID) | ORAL | 3 refills | 90.00000 days | Status: CP
Start: 2020-02-05 — End: ?

## 2020-02-05 MED ORDER — GABAPENTIN 600 MG TABLET
ORAL_TABLET | Freq: Three times a day (TID) | ORAL | 11 refills | 30.00000 days | Status: CP
Start: 2020-02-05 — End: ?

## 2020-02-05 NOTE — Unmapped (Signed)
Patient Name: Autumn Mcdonald  PCP: ??Autumn Hamman, MD  Source of History: patient and records  Date of Visit: 02/05/20 10:12 AM    Identification: Pt self identified using name and date of birth  Patient location: [phone], [place] home. Called patient at 10:15 am as appointment scheduled later today but given wait for next patient reached out if we could complete virtual visit sooner. She agreed. Link sent at 10:16 am.   The limitations of this telemedicine encounter were discussed with patient. Both the patient and myself agreed to this encounter despite these limitations. Benefits of this telemedicine encounter included allowing for continued care of patient and minimizing risk of exposure to COVID-19. Patient also aware that this is a billable encounter with possible copay.     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     PRIOR RHEUMATOLOGIC HISTORY:?? 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   Treatment hx:  -??Started prednisone taper and then mtx in 11/2014.  -??Plaquenil started in 12/2014.??  -??Started imuran in 07/2015, and titrated up to 150 mg qd w/o sufficient control of her PAN.??  - Switched to cellcept??09/2015,??titrated up to 1500 mg BID.??  - Persistent leg swelling and pain, thought to be due to swelling from prednisone, so tapered off prednisone  - Re-biopsied 08/2016, results consistent with??venous stasis disease vs erythema induratum/nodular vasculitis so started on oral potassium iodide and receiving intralesional steroid injections starting in November/December 2018.  - Due to persistent sx, cellcept discontinued and dapsone started by dermatology. Also received intralesional steroid injections with derm. Dapsone caused transaminitis and worsening LE edema so discontinued.  -Was evaluated by vascular surgery and no vascular insufficiency was noted, thus her leg swelling has been attributed to lymphedema.??  - Plaque psoriasis diagnosed by dermatology 2019. Humira started 08/2017.   - HCQ discontinued 09/2017 due to new dx of psoriasis and unclear benefit for PAN.   - Developed TNF induced psoriasis, so humira discontinued and skyrizi started 03/2019  - Current medication regimen: ??skyrizi for psoriasis.     HPI: [facility]umn Mcdonald is a 49 y.o. 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 Last seen by me in person in August 2019. Since COVID-19 pandemic, has been followed primarily virtually. She presents today via video visit by [facility].     She is currently on pentoxifylline (Trental) for her erythema induratum but has not taken it for the past 3-4 days due to refill issues. Will send new prescription.  She is also taking Skyrizi for her psoriasis.  She reports that she has one patch on the inside of her right leg of psorasis that is improving. She reports that she continues to have leg swelling. She recently had cellulitis involving left foot requiring antibiotic therapy with ciprofloxacin so redness and swelling is improving. No fevers. She is not on any systemic prednisone.  She is no longer receiving potassium iodide for the erythema induratum/nodular vasculitis. No major concerns today as left foot cellulitis is improving. She has received COVID-19 vaccination including booster last Friday with [facility].  She has not yet received the seasonal flu vaccine but plans to get in 2-4 weeks with her local pharmacy.  She was [facility] in March 2021 at [facility] with sepsis probably related  to a preceding cellulitis that progressed. We discussed her predilection for cellulitis given LE swelling. Reports gabapentin helpful for her pain.     ROS??: Attests to the above, otherwise, all other systems is negative.   ??  ??  Past Medical and Surgical History:  ??  Patient Active Problem List    Diagnosis Date Noted   ??? Melena 02/16/2017   ??? Chronic narcotic use 09/07/2016   ??? Bleeding stomach ulcer 01/20/2016   ??? Acute blood loss anemia 01/14/2016   ??? BRBPR (bright red blood per rectum) 01/14/2016   ??? Neuropathy 12/09/2015   ??? Migraine headache 11/28/2014   ??? PAN (polyarteritis nodosa) (CMS-HCC) 11/28/2014   ??? Chronic gout of multiple sites 02/25/2014   ??? Gastroesophageal reflux disease 01/08/2014   ??? Leg pain, lateral 12/11/2012   ??? History of surgical procedure 10/31/2012   ??? Psoriasis 01/09/2012   ??? Degeneration of intervertebral disc 11/30/2011   ??? Cutaneous fungal infection 11/15/2011   ??? DJD (degenerative joint disease) 10/14/2011   ??? Frequent urination 04/13/2011   ??? [name] (obstructive sleep apnea) 11/16/2010   ??? Elevated blood sugar 08/23/2010   ??? Other abnormal glucose 10/08/2009   ??? Contracture of tendon 08/17/2009   ??? Leg pain, right 11/05/2008   ??? Other malaise and fatigue 09/19/2008   ??? Ingrowing nail 06/17/2008   ??? Adverse drug reaction 04/03/2008   ??? Morbid obesity (CMS-HCC) 01/03/2007   ??? Hyperlipidemia 09/22/2006   ??? Pain in joint, lower leg 09/22/2006   ??? Muscle pain 09/20/2006   ??? Fibromyalgia 09/20/2006   ??? Anxiety 06/28/2006   ??? Hypertension 06/28/2006     Past Surgical History:   Procedure Laterality Date   ??? CESAREAN SECTION  1994   ??? CHOLECYSTECTOMY  March 1994   ??? GASTRIC BYPASS  January 2014   ??? HYSTERECTOMY  June 1998   ??? SKIN BIOPSY  2017     Allergies:   ??  Allergies   Allergen Reactions   ??? Iohexol Hives     Pt treated with PO benedryl   ??? Keflex [Cephalexin] Shortness Of Breath and Hives   ??? Lincomycin Hcl Hives   ??? Lisinopril Hives     Hives  Hives   ??? Sulfa (Sulfonamide Antibiotics) Swelling and Rash   ??? Sulfamethoxazole-Trimethoprim Hives     REACTION: Rash, throat closed, eyes swelled.  Hives  Hives   ??? Aspirin      REACTION: Hives  High Doses only. Causes rash.  High Doses only. Causes rash.   ??? Clindamycin      Hives  Hives   ??? Doxycycline      REACTION: Hives  Hives  Hives   ??? Naproxen      REACTION: Hives  Eyes well  Eyes well   ??? Niacin      REACTION: Swelling, problems breathing   ??? Benzoin Compound Rash   ??? Pneumococcal 23-Val Ps Vaccine Rash     cellulitis     Current Outpatient Medications:  ??  Current Outpatient Medications on File Prior to Visit   Medication Sig Dispense Refill   ??? ARIPiprazole (ABILIFY) 10 MG tablet Take 10 mg by mouth daily.     ??? armodafinil (NUVIGIL) 250 mg tablet Take 250 mg by mouth three (3) times a day.      ??? betamethasone dipropionate (DIPROLENE) 0.05 % ointment Apply topically Two (2) times a day. To affected areas as needed 60 g 5   ??? biotin  10 mg Tab 10 mg per 1 tablet, ORAL, Daily, 0 Refill(s)     ??? buPROPion (WELLBUTRIN XL) 150 MG 24 hr tablet Take 150 mg by mouth.     ??? busPIRone (BUSPAR) 30 MG tablet Take 30 mg by mouth.     ??? calcium citrate (CALCITRATE) 200 mg elem calcium (950 mg) tablet 950 mg per 1 tablet, ORAL, Daily, 60 tablet, 0 Refill(s)     ??? clonazePAM (KLONOPIN) 1 MG tablet TAKE 1 TABLET BY MOUTH EVERY DAY     ??? cyclobenzaprine (FLEXERIL) 10 MG tablet Take 10 mg by mouth once as needed.      ??? diclofenac sodium (VOLTAREN) 1 % gel Apply 2-4 g topically Four (4) times a day. 300 g 11   ??? DULoxetine (CYMBALTA) 60 MG capsule Take 60 mg by mouth daily. 120 mg daily     ??? ferrous sulfate 325 (65 FE) MG tablet Take 1 tablet (325 mg total) by mouth daily with breakfast.  6   ??? fluconazole (DIFLUCAN) 100 MG tablet Take 100 mg by mouth.     ??? furosemide (LASIX) 80 MG tablet TAKE 1 TABLET BY MOUTH DAILY. can TAKE 1 extra TABLET IF weigh INCREASE by 5lb AND call your doctor FOR further instruction     ??? gabapentin (NEURONTIN) 600 MG tablet Take 2 tablets (1,200 mg total) by mouth Three (3) times a day. 90 tablet 3   ??? nebivolol (BYSTOLIC) 10 MG tablet Take 10 mg by mouth daily.      ??? NYSTOP 100,000 unit/gram powder APPLY 4 grams TO THE AFFECTED AREA 4 TIMES DAILY     ??? OXcarbazepine (TRILEPTAL) 300 MG tablet Take 300 mg by mouth.     ??? pantoprazole (PROTONIX) 40 MG tablet Take 40 mg by mouth Two (2) times a day. bid     ??? promethazine (PHENERGAN) 25 MG tablet once as needed.      ??? risankizumab-rzaa (SKYRIZI) 150 mg/mL Syrg Inject the contents of 1 syringe (150 mg) under the skin every 12 weeks. 1 mL 1   ??? rosuvastatin (CRESTOR) 20 MG tablet TAKE 1 TABLET BY MOUTH EVERY DAY       No current facility-administered medications on file prior to visit.       Immunization History   Administered Date(s) Administered   ??? COVID-19 VACC,MRNA,(PFIZER)(PF)(IM) 08/15/2019, 09/09/2019, 01/31/2020   ??? INFLUENZA TIV (TRI) 45MO+ W/ PRESERV (IM) 03/17/2011   ??? Influenza Vaccine Quad (IIV4 PF) 53mo+ injectable 02/17/2015, 05/17/2015, 02/23/2016, 02/13/2017, 01/18/2019   ??? Influenza Virus Vaccine, unspecified formulation 02/27/2013, 06/17/2015, 03/14/2016, 01/11/2018   ??? Influenza Whole 02/15/2010   ??? PNEUMOCOCCAL POLYSACCHARIDE 23 03/17/2011, 01/10/2018   ??? Pneumococcal Conjugate 13-Valent 10/04/2017   ??? TdaP 01/05/2016, 07/24/2018   ??? Tetanus and diptheria,(adult), adsorbed, 2Lf tetanus toxoid, PF 05/16/2002     ????  PHYSICAL EXAM?? - limited exam over video visit  Vital signs: There were no vitals taken for this visit.There is no height or weight on file to calculate BMI.  Gen: Pleasant and cooperative. AOx4.?  HEENT:Face symmetric  Lungs:Talking in full sentences. No audible wheezing  Extremities: Warm, 2+ radial and pedal pulses, no C/C/E.????  Neuro: Good comprehension/cognition. CN 2-12 intact.  ????  Limited Musculoskeletal Examination:????  ?? Jaw, neck without limited ROM.????  ?? Shoulders, elbows, wrists, hands, fingers:  No deformity, swelling, or limited ROM.??  ?? Unable to visualize lower extremity as we lost connection every time she tried to flip camera to leg  Skin: No alopecia, nail change (including no nail pitting), rashes, bruising, petechiae, telangiectasias, tophi, appreciable calcinosis, or nodules.??     LABORATORY - no recent labs for medication monitoring  ????  ????GENERAL SUMMARY AND IMPRESSION: ????  ????  In summary, the patient is a 49 y.o. female with h/o of possible cutaneous PAN vs erythema induratum now on pentoxifylline and TNF induced psoriasis being managed with [facility] by Dermatology. Patient reports stable status in regards to LE edema and psoriasis. Has had multiple cellulitis with one progressing to Sepsis in March 2021. Just competed antibiotics for left lower extremity cellulitis. Unfortunately due to video visit, was not able to visualize her lower extremity fully but she reports most recent cellulitis with associated swelling is improving. She feels well at her usual baseline. Fibromyalgia under control with gabapentin. Refilled both pentoxifylline and gabapentin for her today. She needs monitoring labs ordered to [facility] for her to complete at earliest convenience. She has completed COVID-19 vaccination series plus booster and is aware to get seasonal flu vaccine in the next 2-4 weeks. Follow-up in 4 months with Carlus Pavlov [facility] and 8 months with myself. Advised patient to present in person given limitation of video visit. She agrees.     RECOMMENDATIONS: ????  ????   Diagnosis ICD-10-CM Associated Orders   1. Psoriasis  L40.9 CBC w/ Differential     Creatinine     AST     ALT     25 OH Vit D     CBC w/ Differential     Creatinine     AST     ALT     25 OH Vit D   2. PAN (polyarteritis nodosa) (CMS-HCC)  M30.0 pentoxifylline (TRENTAL) 400 mg CR tablet     CBC w/ Differential     Creatinine     AST     ALT     25 OH Vit D     CBC w/ Differential     Creatinine     AST     ALT     25 OH Vit D   3. Polyarteritis nodosa (CMS-HCC)  M30.0 gabapentin (NEURONTIN) 600 MG tablet   4. High risk medication use  Z79.899 CBC w/ Differential     Creatinine     AST     ALT     25 OH Vit D     CBC w/ Differential     Creatinine     AST     ALT     25 OH Vit D   There are no Patient Instructions on file for this visit.  The patient indicates understanding of these issues and agrees to the plan as outlined above.  Contact information provided for any concerns or questions in the interim.  ??      I spent 20 minutes on the real-time audio and video visit with the patient on the date of service. I spent an additional 10 minutes on pre- and post-visit activities on the date of service.     The patient was not located and I was located within 250 yards of a hospital based location during the real-time audio and video visit. The patient was physically located in [place] or a state in which I am permitted to provide care. The patient and/or parent/guardian understood that s/he may incur co-pays and cost sharing, and agreed to the telemedicine visit. The visit was reasonable and appropriate under the circumstances given the patient's presentation at  the time.    The patient and/or parent/guardian has been advised of the potential risks and limitations of this mode of treatment (including, but not limited to, the absence of in-person examination) and has agreed to be treated using telemedicine. The patient's/patient's family's questions regarding telemedicine have been answered.    If the visit was completed in an ambulatory setting, the patient and/or parent/guardian has also been advised to contact their provider???s office for worsening conditions, and seek emergency medical treatment and/or call 911 if the patient deems either necessary.    Isack Lavalley C. Scarlette Calico, MD, PhD  Assistant Professor of Medicine  Department of Medicine/Division of Rheumatology  [facility]  10:51 AM

## 2020-02-05 NOTE — Telephone Encounter (Signed)
Clonazepam LFD 10/16/19 #30 with 3 refills LOV 12/11/19 NOV none

## 2020-02-07 DIAGNOSIS — F3341 Major depressive disorder, recurrent, in partial remission: Secondary | ICD-10-CM | POA: Diagnosis not present

## 2020-02-07 DIAGNOSIS — F518 Other sleep disorders not due to a substance or known physiological condition: Secondary | ICD-10-CM | POA: Diagnosis not present

## 2020-02-18 ENCOUNTER — Other Ambulatory Visit: Payer: Self-pay | Admitting: Family Medicine

## 2020-02-20 ENCOUNTER — Other Ambulatory Visit: Payer: Self-pay | Admitting: Family Medicine

## 2020-02-21 NOTE — Telephone Encounter (Signed)
Please advise 

## 2020-02-24 ENCOUNTER — Other Ambulatory Visit: Payer: Self-pay | Admitting: Family Medicine

## 2020-02-24 NOTE — Unmapped (Signed)
This encounter was created in error - please disregard.

## 2020-02-26 ENCOUNTER — Other Ambulatory Visit: Payer: Self-pay | Admitting: Family Medicine

## 2020-02-26 ENCOUNTER — Encounter: Payer: Self-pay | Admitting: Family Medicine

## 2020-02-26 MED ORDER — FLUCONAZOLE 100 MG PO TABS
100.0000 mg | ORAL_TABLET | Freq: Every day | ORAL | 0 refills | Status: DC
Start: 2020-02-26 — End: 2020-05-12

## 2020-02-26 MED ORDER — HYDROCODONE-ACETAMINOPHEN 10-325 MG PO TABS
1.0000 | ORAL_TABLET | Freq: Four times a day (QID) | ORAL | 0 refills | Status: DC | PRN
Start: 2020-02-26 — End: 2020-03-26

## 2020-02-26 NOTE — Telephone Encounter (Signed)
Last OV 12/11/19 Hydrocodone last filled 01/30/20 #90 with 0

## 2020-03-03 ENCOUNTER — Telehealth: Payer: Self-pay

## 2020-03-03 ENCOUNTER — Telehealth: Payer: Self-pay | Admitting: General Practice

## 2020-03-03 DIAGNOSIS — G4733 Obstructive sleep apnea (adult) (pediatric): Secondary | ICD-10-CM | POA: Diagnosis not present

## 2020-03-03 NOTE — Progress Notes (Signed)
Chronic Care Management Pharmacy Assistant   Name: [name]  MRN: [id number] DOB: [birth year]  Reason for Encounter: Initial Visit   Patient Questions:  1.  Have you seen any other providers since your last visit? No  2.  Any changes in your medicines or health? No   [name],  49 y.o. , female presents for their Initial CCM visit with the clinical pharmacist via telephone.  PCP : [name], MD  Allergies:   Allergies  Allergen Reactions  . Niacin Anaphylaxis, Swelling and Other (See Comments)    REACTION: Swelling, problems breathing  . Sulfamethoxazole-Trimethoprim Anaphylaxis, Swelling, Rash and Other (See Comments)    REACTION: Rash, throat closed, eyes swelled.  . Aspirin Hives  . Bactrim Hives  . Benzoin Compound Rash  . Cephalexin Hives    Tolerated ceftriaxone and cefepime 07/2019  . Clindamycin/Lincomycin Hives  . Doxycycline Hives  . Iohexol Hives    Pt treated with PO benedryl  . Lisinopril Hives  . Naproxen Hives  . Pnu-Imune [Pneumococcal Polysaccharide Vaccine] Rash  . Sulfonamide Derivatives Rash    Medications: Outpatient Encounter Medications as of 03/03/2020  Medication Sig  . ARIPiprazole (ABILIFY) 10 MG tablet Take 10 mg by mouth daily.  . Armodafinil 250 MG tablet   . Biotin 10 MG TABS Take 10 mg by mouth daily.  Marland Kitchen buPROPion (WELLBUTRIN XL) 300 MG 24 hr tablet TAKE 1 TABLET BY MOUTH EVERY DAY  . busPIRone (BUSPAR) 30 MG tablet TAKE 1 TABLET BY MOUTH 2 TIMES DAILY  . BYSTOLIC 10 MG tablet TAKE 1 TABLET BY MOUTH EVERY DAY  . calcium citrate (CALCITRATE - DOSED IN MG ELEMENTAL CALCIUM) 950 MG tablet Take 1 tablet by mouth daily.  . ciprofloxacin (CIPRO) 500 MG tablet Take 1 tablet (500 mg total) by mouth 2 (two) times daily.  . clobetasol cream (TEMOVATE) 7.90 % Apply 1 application topically 2 (two) times daily. Cover both legs with Colbetasol cream  . clonazePAM (KLONOPIN) 1 MG tablet TAKE 1 TABLET BY MOUTH EVERY DAY  .  collagenase (SANTYL) ointment Apply topically daily. apply Santyl to scabs on legs  . cyclobenzaprine (FLEXERIL) 10 MG tablet TAKE 1 TABLET BY MOUTH 3 TIMES DAILY AS NEEDED  . DULoxetine (CYMBALTA) 60 MG capsule Take 2 capsules (120 mg total) by mouth daily.  Marland Kitchen eletriptan (RELPAX) 40 MG tablet TAKE 1 TABLET BY MOUTH AS NEEDED FOR MIGRAINE. MAY REPEAT in 2 hours IF necessary  . FEROSUL 325 (65 Fe) MG tablet TAKE 1 TABLET BY MOUTH EVERY DAY WITH BREAKFAST  . fluconazole (DIFLUCAN) 100 MG tablet Take 1 tablet (100 mg total) by mouth daily.  . furosemide (LASIX) 80 MG tablet TAKE 1 TABLET BY MOUTH DAILY. can TAKE 1 extra TABLET IF weight INCREASE by 5lb AND call your doctor FOR further instruction  . gabapentin (NEURONTIN) 600 MG tablet TAKE 2 TABLETS BY MOUTH 3 TIMES DAILY  . HYDROcodone-acetaminophen (NORCO) 10-325 MG tablet Take 1 tablet by mouth every 6 (six) hours as needed.  . irbesartan (AVAPRO) 75 MG tablet TAKE 1 TABLET BY MOUTH EVERY DAY  . meclizine (ANTIVERT) 25 MG tablet Take 1 tablet (25 mg total) by mouth 3 (three) times daily as needed for dizziness.  . mupirocin ointment (BACTROBAN) 2 % APPLY TOPICALLY 2 TIMES DAILY  . nystatin (MYCOSTATIN/NYSTOP) powder Apply 1 application topically 3 (three) times daily.  . Oxcarbazepine (TRILEPTAL) 300 MG tablet Take 300 mg by mouth 2 (two) times daily.  Marland Kitchen  pantoprazole (PROTONIX) 40 MG tablet TAKE 1 TABLET BY MOUTH 2 TIMES DAILY  . pentoxifylline (TRENTAL) 400 MG CR tablet Take 400 mg by mouth 3 (three) times daily with meals.   . promethazine (PHENERGAN) 25 MG tablet TAKE 1 TABLET BY MOUTH EVERY 6 HOURS AS NEEDED FOR NAUSEA AND VOMITING  . rosuvastatin (CRESTOR) 20 MG tablet TAKE 1 TABLET BY MOUTH EVERY DAY  . senna-docusate (SENOKOT-S) 8.6-50 MG tablet Take 1 tablet by mouth at bedtime.  . SKYRIZI, 150 MG DOSE, 75 MG/0.83ML PSKT Inject 150 mg into the skin every 3 (three) months.    No facility-administered encounter medications on file as of  03/03/2020.    Current Diagnosis: Patient Active Problem List   Diagnosis Date Noted  . Hyponatremia 07/27/2019  . Urinary urgency 04/04/2018  . Chronic narcotic use 09/07/2016  . Acute blood loss anemia 01/14/2016  . Multiple gastric ulcers   . PAN (polyarteritis nodosa) ([place]) 11/24/2015  . GERD (gastroesophageal reflux disease) 01/08/2014  . Routine general medical examination at a health care facility 04/23/2013  . Bariatric surgery status 10/31/2012  . Psoriasis 01/09/2012  . DDD (degenerative disc disease), lumbar 11/30/2011  . Migraine headache   . [name] (obstructive sleep apnea) 11/16/2010  . HYPERGLYCEMIA, FASTING 10/08/2009  . CONTRACTURE OF TENDON 08/17/2009  . PARESTHESIA, HANDS 12/23/2008  . Leg pain, right 11/05/2008  . ADVERSE DRUG REACTION 04/03/2008  . PERIPHERAL EDEMA 03/26/2008  . Morbid obesity ([place]) 01/03/2007  . Cellulitis 01/03/2007  . Hyperlipidemia 09/22/2006  . Fibromyalgia 09/20/2006  . Anxiety and depression 06/28/2006     Have you seen any other providers since your last visit?     Patient states she has not seen any other providers.  Any changes in your medications or health?       Patient states she has no changes in medications.  Any side effects from any medications?        -Patient states she has no side effects from any medications.  Do you have an symptoms or problems not managed by your medications?   - Patient states she has no other symptoms or problems not managed by medications.   Any concerns about your health right now?            -Patient states she has no concerns regarding her health at this time.  Has your provider asked that you check blood pressure, blood sugar, or follow special diet at home?     - Patient states she does check blood pressure and follows a diet.  Do you get any type of exercise on a regular basis?    Patient states she moves around in the house and does many chores around the house.   Can you think  of a goal you would like to reach for your health?     -Patient states she would like to be under Under 300 lb with her weight.  Do you have any problems getting your medications?  Patient states she does not have any problems getting medications.   Is there anything that you would like to discuss during the appointment?     -Patient states she continues to get  reoccurring cellulitis on her leg and yeast over growth on her belly and legs. Patient states doctors have told her about losing weight to help with the reoccurring issues.Patient states she is  Managing her weight and has lost 100 lbs thus far.  Please bring medications and supplements to appointment  Georgiana Shore ,[facility] Pharmacist Assistant [phone]      Follow-Up:  Pharmacist Review

## 2020-03-03 NOTE — Telephone Encounter (Signed)
PA for Relpax began today in covermymeds

## 2020-03-05 ENCOUNTER — Ambulatory Visit: Payer: Medicare HMO

## 2020-03-05 DIAGNOSIS — R7309 Other abnormal glucose: Secondary | ICD-10-CM

## 2020-03-05 DIAGNOSIS — K219 Gastro-esophageal reflux disease without esophagitis: Secondary | ICD-10-CM

## 2020-03-05 DIAGNOSIS — E785 Hyperlipidemia, unspecified: Secondary | ICD-10-CM

## 2020-03-05 DIAGNOSIS — I10 Essential (primary) hypertension: Secondary | ICD-10-CM

## 2020-03-05 NOTE — Progress Notes (Signed)
Chronic Care Management Pharmacy  Name: [name]  MRN: [id number]  DOB: 1970-09-09  Chief Complaint/ HPI  [name], 49 y.o., female presents for their Initial CCM visit with the clinical pharmacist via telephone due to COVID-19 Pandemic.  PCP : Erica Minium, MD  Chronic conditions include:  Encounter Diagnoses  Name Primary?  . Hyperlipidemia, unspecified hyperlipidemia type Yes  . Essential hypertension   . Gastroesophageal reflux disease, unspecified whether esophagitis present     Office Visits:  03/03/2020(TE): PA for Relpax started 01/30/2020 (Msg): COVID vaccine 3rd dose advised due to compromised immune system  Patient Active Problem List   Diagnosis Date Noted  . Hyponatremia 07/27/2019  . Urinary urgency 04/04/2018  . Chronic narcotic use 09/07/2016  . Acute blood loss anemia 01/14/2016  . Multiple gastric ulcers   . PAN (polyarteritis nodosa) ([place]) 11/24/2015  . GERD (gastroesophageal reflux disease) 01/08/2014  . Routine general medical examination at a health care facility 04/23/2013  . Bariatric surgery status 10/31/2012  . Psoriasis 01/09/2012  . DDD (degenerative disc disease), lumbar 11/30/2011  . Migraine headache   . [name] (obstructive sleep apnea) 11/16/2010  . HYPERGLYCEMIA, FASTING 10/08/2009  . CONTRACTURE OF TENDON 08/17/2009  . PARESTHESIA, HANDS 12/23/2008  . Leg pain, right 11/05/2008  . ADVERSE DRUG REACTION 04/03/2008  . PERIPHERAL EDEMA 03/26/2008  . Morbid obesity ([name]) 01/03/2007  . Cellulitis 01/03/2007  . Hyperlipidemia 09/22/2006  . Fibromyalgia 09/20/2006  . Anxiety and depression 06/28/2006   Past Surgical History:  Procedure Laterality Date  . ABDOMINAL HYSTERECTOMY  10/1996  . ABDOMINAL SURGERY  jan/11/2012   gastric bypass surgery  . CARPAL TUNNEL RELEASE     bilateral   . CESAREAN SECTION    . CHOLECYSTECTOMY  07/1992  . ESOPHAGOGASTRODUODENOSCOPY N/A 01/14/2016   Procedure: ESOPHAGOGASTRODUODENOSCOPY (EGD);   Surgeon: Mauri Pole, MD;  Location: [facility] ENDOSCOPY;  Service: Endoscopy;  Laterality: N/A;   Family History  Problem Relation Age of Onset  . Diabetes Mother   . Emphysema Mother   . Allergies Mother   . Hypertension Mother   . Heart disease Father   . Allergies Father   . Prostate cancer Father   . Breast cancer Maternal Grandmother 46   Allergies  Allergen Reactions  . Niacin Anaphylaxis, Swelling and Other (See Comments)    REACTION: Swelling, problems breathing  . Sulfamethoxazole-Trimethoprim Anaphylaxis, Swelling, Rash and Other (See Comments)    REACTION: Rash, throat closed, eyes swelled.  . Aspirin Hives  . Bactrim Hives  . Benzoin Compound Rash  . Cephalexin Hives    Tolerated ceftriaxone and cefepime 07/2019  . Clindamycin/Lincomycin Hives  . Doxycycline Hives  . Iohexol Hives    Pt treated with PO benedryl  . Lisinopril Hives  . Naproxen Hives  . Pnu-Imune [Pneumococcal Polysaccharide Vaccine] Rash  . Sulfonamide Derivatives Rash   Outpatient Encounter Medications as of 03/05/2020  Medication Sig  . ARIPiprazole (ABILIFY) 10 MG tablet Take 10 mg by mouth in the morning.   . Armodafinil 250 MG tablet 3 (three) times daily. 8am, 2pm, 6pm  . Biotin 10 MG TABS Take 10 mg by mouth in the morning.   Marland Kitchen buPROPion (WELLBUTRIN XL) 300 MG 24 hr tablet TAKE 1 TABLET BY MOUTH EVERY DAY  . BYSTOLIC 10 MG tablet TAKE 1 TABLET BY MOUTH EVERY DAY  . calcium citrate (CALCITRATE - DOSED IN MG ELEMENTAL CALCIUM) 950 MG tablet Take 1 tablet by mouth daily.  . clobetasol cream (TEMOVATE)  3.76 % Apply 1 application topically 2 (two) times daily. Cover both legs with Colbetasol cream  . clonazePAM (KLONOPIN) 1 MG tablet TAKE 1 TABLET BY MOUTH EVERY DAY (Patient taking differently: at bedtime. )  . collagenase (SANTYL) ointment Apply topically daily. apply Santyl to scabs on legs  . cyclobenzaprine (FLEXERIL) 10 MG tablet TAKE 1 TABLET BY MOUTH 3 TIMES DAILY AS NEEDED  .  DULoxetine (CYMBALTA) 60 MG capsule Take 2 capsules (120 mg total) by mouth daily.  Marland Kitchen eletriptan (RELPAX) 40 MG tablet TAKE 1 TABLET BY MOUTH AS NEEDED FOR MIGRAINE. MAY REPEAT in 2 hours IF necessary  . FEROSUL 325 (65 Fe) MG tablet TAKE 1 TABLET BY MOUTH EVERY DAY WITH BREAKFAST  . gabapentin (NEURONTIN) 600 MG tablet TAKE 2 TABLETS BY MOUTH 3 TIMES DAILY  . HYDROcodone-acetaminophen (NORCO) 10-325 MG tablet Take 1 tablet by mouth every 6 (six) hours as needed.  . irbesartan (AVAPRO) 75 MG tablet TAKE 1 TABLET BY MOUTH EVERY DAY  . meclizine (ANTIVERT) 25 MG tablet Take 1 tablet (25 mg total) by mouth 3 (three) times daily as needed for dizziness.  . Oxcarbazepine (TRILEPTAL) 300 MG tablet Take 300 mg by mouth 2 (two) times daily.  . pentoxifylline (TRENTAL) 400 MG CR tablet Take 400 mg by mouth 3 (three) times daily with meals.   . senna-docusate (SENOKOT-S) 8.6-50 MG tablet Take 1 tablet by mouth at bedtime.  . SKYRIZI, 150 MG DOSE, 75 MG/0.83ML PSKT Inject 150 mg into the skin every 3 (three) months.   . [DISCONTINUED] busPIRone (BUSPAR) 30 MG tablet TAKE 1 TABLET BY MOUTH 2 TIMES DAILY  . [DISCONTINUED] furosemide (LASIX) 80 MG tablet TAKE 1 TABLET BY MOUTH DAILY. can TAKE 1 extra TABLET IF weight INCREASE by 5lb AND call your doctor FOR further instruction  . [DISCONTINUED] pantoprazole (PROTONIX) 40 MG tablet TAKE 1 TABLET BY MOUTH 2 TIMES DAILY  . [DISCONTINUED] rosuvastatin (CRESTOR) 20 MG tablet TAKE 1 TABLET BY MOUTH EVERY DAY  . ciprofloxacin (CIPRO) 500 MG tablet Take 1 tablet (500 mg total) by mouth 2 (two) times daily.  . fluconazole (DIFLUCAN) 100 MG tablet Take 1 tablet (100 mg total) by mouth daily. (Patient not taking: Reported on 03/05/2020)  . mupirocin ointment (BACTROBAN) 2 % APPLY TOPICALLY 2 TIMES DAILY  . nystatin (MYCOSTATIN/NYSTOP) powder Apply 1 application topically 3 (three) times daily.  . promethazine (PHENERGAN) 25 MG tablet TAKE 1 TABLET BY MOUTH EVERY 6 HOURS  AS NEEDED FOR NAUSEA AND VOMITING   No facility-administered encounter medications on file as of 03/05/2020.   Patient Care Team    Relationship Specialty Notifications Start End  Erica Minium, MD PCP - General   04/21/10   Eino Farber, PA-C Consulting Physician Physician Assistant  11/24/15    Comment: [facility]  Blima Dessert, MD Referring Physician Rheumatology  11/24/15   Loa Socks, MD Referring Physician Dermatology  12/21/16   Terrance Mass, MD (Inactive)  Gynecology  12/21/16   Anastasio Auerbach, MD (Inactive) Consulting Physician Gynecology  06/13/18   Madelin Rear, [facility] Pharmacist Pharmacist  02/03/20    Comment: [phone]   Current Diagnosis/Assessment: Goals Addressed            This Visit's Progress   . PharmD Care Plan       CARE PLAN ENTRY (see longitudinal plan of care for additional care plan information)  Current Barriers:  . Chronic Disease Management support, education, and care coordination needs related to Hypertension,  Hyperlipidemia, and GERD   Hypertension BP Readings from Last 3 Encounters:  12/11/19 126/79  10/03/19 (!) 143/98  09/25/19 (!) 135/91   . Pharmacist Clinical Goal(s): o Over the next 180 days, patient will work with PharmD and providers to maintain BP goal <130/80 . Current regimen:  o Irbesartan 75 mg tablet - half tablet (37.5 mg) once daily o Bystolic 10 mg once daily . Interventions: o Diet / exercise recommendations . Patient self care activities - Over the next 180 days, patient will: o Check BP at least once every 1-2 weeks, document, and provide at future appointments o Ensure daily salt intake < 2300 mg/day  Hyperlipidemia Lab Results  Component Value Date/Time   LDLCALC 59 07/26/2019 04:28 PM   LDLDIRECT 76.0 11/24/2015 11:48 AM   . Pharmacist Clinical Goal(s): o Over the next 180 days, patient will work with PharmD and providers to maintain LDL goal < 70 . Current regimen:  o Rosuvastatin 20  mg once daily . Interventions: o Diet / exercise recommendation . Patient self care activities - Over the next 180 days, patient will: o Continue current management  GERD . Pharmacist Clinical Goal(s) o Over the next 180 days, patient will work with PharmD and providers to minimize acid reflux symptoms and ensure medication safety . Current regimen:  o Pantoprazole 40 mg twice daily . Interventions: o Reviewed reflux triggers . Patient self care activities - Over the next 180 days, patient will: o Continue current management  Medication management . Pharmacist Clinical Goal(s): o Over the next 180 days, patient will work with PharmD and providers to maintain optimal medication adherence . Current pharmacy: [facility] . Interventions o Comprehensive medication review performed. o Continue current medication management strategy . Patient self care activities - Over the next 180 days, patient will: o Focus on medication adherence by Continue current management o Take medications as prescribed o Report any questions or concerns to PharmD and/or provider(s) Initial goal documentation.      Hypertension   BP goal <130/80  BP Readings from Last 3 Encounters:  12/11/19 126/79  10/03/19 (!) 143/98  09/25/19 (!) 135/91   Patient checks BP at home daily Patient home BP readings are ranging: 104/79  Patient is currently at goal on the following medications:  . Bystolic 10 mg once daily in morning  . Irbesartan 75 mg once daily - half tablet daily as needed  We discussed diet and exercise extensively. Tries to stay on feet throughout the day with daily activities. Eggs/toast, vegetables. Has three meals daily.  Plan  Continue current medications and control with diet and exercise.   Hyperlipidemia   LDL goal < 70  Lipid Panel     Component Value Date/Time   CHOL 107 07/26/2019 1628   TRIG 47 07/26/2019 1628   HDL 39 (L) 07/26/2019 1628   LDLCALC 59 07/26/2019  1628   LDLDIRECT 76.0 11/24/2015 1148    Hepatic Function Latest Ref Rng & Units 07/26/2019 04/15/2019 05/11/2018  Total Protein 6.5 - 8.1 g/dL 6.4(L) - 6.8  Albumin 3.5 - 5.0 g/dL 2.8(L) - 3.9  AST 15 - 41 U/L '22 14 18  ' ALT 0 - 44 U/L '15 12 14  ' Alk Phosphatase 38 - 126 U/L 113 132(A) 144(H)  Total Bilirubin 0.3 - 1.2 mg/dL 0.4 - 0.3  Bilirubin, Direct 0.0 - 0.3 mg/dL - - 0.1    The ASCVD Risk score ([place]., et al., 2013) failed to calculate for the following  reasons:   The valid total cholesterol range is 130 to 320 mg/dL   Patient has failed these meds in past: n/a Patient is currently at goal on the following medications:  . Rosuvastatin 20 mg once daily  We discussed:  diet and exercise extensively - see HTN.   Plan  Continue current medications and control with diet and exercise.  GERD   Patient denies dysphagia, heartburn or nausea. Expresses understanding to avoid triggers. Currently controlled on: . Pantoprazole 40 mg twice daily   Plan   Continue current medication.  Vaccines   Immunization History  Administered Date(s) Administered  . Influenza Split 03/17/2011  . Influenza Whole 02/15/2010  . Influenza,inj,Quad PF,6+ Mos 05/17/2015, 02/23/2016, 02/13/2017, 01/18/2019  . Influenza-Unspecified 02/27/2013, 01/11/2018  . PFIZER SARS-COV-2 Vaccination 08/15/2019, 09/09/2019, 01/31/2020  . Pneumococcal Conjugate-13 10/04/2017  . Pneumococcal Polysaccharide-23 03/17/2011, 01/10/2018  . Td 05/16/2002  . Tdap 07/24/2018   Reviewed and discussed patient's vaccination history.    Plan  Recommended patient receive flu vaccine at office visit next week while here for PCP visit.   Medication Management / Care Coordination   Receives prescription medications from:  [place], [place] - [address] [place] [address] Phone: [phone] Fax: [phone]   Denies any issues with current medication management.    Plan  Continue current medication management strategy. ___________________________ SDOH (Social Determinants of Health) assessments performed: Yes.  Future Appointments  Date Time Provider [place]  03/12/2020  1:30 PM Erica Minium, MD [facility]  06/11/2020  9:00 AM [facility] PHARMACIST [facility]   Visit follow-up:  . CPA follow-up: 1 month BP call. Marland Kitchen RPH follow-up: 3 month telephone visit.  Madelin Rear, Pharm.D., BCGP Clinical Pharmacist [facility] [phone]

## 2020-03-07 ENCOUNTER — Other Ambulatory Visit: Payer: Self-pay | Admitting: Family Medicine

## 2020-03-09 NOTE — Patient Instructions (Signed)
Please review care plan below and call me at [phone] with any questions!  Thank you, Edyth Gunnels., Clinical Pharmacist  Goals Addressed            This Visit's Progress   . PharmD Care Plan       CARE PLAN ENTRY (see longitudinal plan of care for additional care plan information)  Current Barriers:  . Chronic Disease Management support, education, and care coordination needs related to Hypertension, Hyperlipidemia, and GERD   Hypertension BP Readings from Last 3 Encounters:  12/11/19 126/79  10/03/19 (!) 143/98  09/25/19 (!) 135/91   . Pharmacist Clinical Goal(s): o Over the next 180 days, patient will work with PharmD and providers to maintain BP goal <130/80 . Current regimen:  o Irbesartan 75 mg tablet - half tablet (37.5 mg) once daily o Bystolic 10 mg once daily . Interventions: o Diet / exercise recommendations . Patient self care activities - Over the next 180 days, patient will: o Check BP at least once every 1-2 weeks, document, and provide at future appointments o Ensure daily salt intake < 2300 mg/day  Hyperlipidemia Lab Results  Component Value Date/Time   LDLCALC 59 07/26/2019 04:28 PM   LDLDIRECT 76.0 11/24/2015 11:48 AM   . Pharmacist Clinical Goal(s): o Over the next 180 days, patient will work with PharmD and providers to maintain LDL goal < 70 . Current regimen:  o Rosuvastatin 20 mg once daily . Interventions: o Diet / exercise recommendation . Patient self care activities - Over the next 180 days, patient will: o Continue current management  GERD . Pharmacist Clinical Goal(s) o Over the next 180 days, patient will work with PharmD and providers to minimize acid reflux symptoms and ensure medication safety . Current regimen:  o Pantoprazole 40 mg twice daily . Interventions: o Reviewed reflux triggers . Patient self care activities - Over the next 180 days, patient will: o Continue current management  Medication management . Pharmacist  Clinical Goal(s): o Over the next 180 days, patient will work with PharmD and providers to maintain optimal medication adherence . Current pharmacy: [facility] . Interventions o Comprehensive medication review performed. o Continue current medication management strategy . Patient self care activities - Over the next 180 days, patient will: o Focus on medication adherence by Continue current management o Take medications as prescribed o Report any questions or concerns to PharmD and/or provider(s) Initial goal documentation.      The patient verbalized understanding of instructions provided today and agreed to receive a mailed copy of patient instruction and/or educational materials. Telephone follow up appointment with pharmacy team member scheduled for: See next appointment with "Care Management Staff" under "What's Next" below.   Madelin Rear, Pharm.D., BCGP Clinical Pharmacist [facility] [phone]  High Cholesterol  High cholesterol is a condition in which the blood has high levels of a white, waxy, fat-like substance (cholesterol). The human body needs small amounts of cholesterol. The liver makes all the cholesterol that the body needs. Extra (excess) cholesterol comes from the food that we eat. Cholesterol is carried from the liver by the blood through the blood vessels. If you have high cholesterol, deposits (plaques) may build up on the walls of your blood vessels (arteries). Plaques make the arteries narrower and stiffer. Cholesterol plaques increase your risk for heart attack and stroke. Work with your health care provider to keep your cholesterol levels in a healthy range. What increases the risk? This condition is more likely  to develop in people who:  Eat foods that are high in animal fat (saturated fat) or cholesterol.  Are overweight.  Are not getting enough exercise.  Have a family history of high cholesterol. What are the signs or  symptoms? There are no symptoms of this condition. How is this diagnosed? This condition may be diagnosed from the results of a blood test.  If you are older than age 69, your health care provider may check your cholesterol every 4-6 years.  You may be checked more often if you already have high cholesterol or other risk factors for heart disease. The blood test for cholesterol measures:  "Bad" cholesterol (LDL cholesterol). This is the main type of cholesterol that causes heart disease. The desired level for LDL is less than 100.  "Good" cholesterol (HDL cholesterol). This type helps to protect against heart disease by cleaning the arteries and carrying the LDL away. The desired level for HDL is 60 or higher.  Triglycerides. These are fats that the body can store or burn for energy. The desired number for triglycerides is lower than 150.  Total cholesterol. This is a measure of the total amount of cholesterol in your blood, including LDL cholesterol, HDL cholesterol, and triglycerides. A healthy number is less than 200. How is this treated? This condition is treated with diet changes, lifestyle changes, and medicines. Diet changes  This may include eating more whole grains, fruits, vegetables, nuts, and fish.  This may also include cutting back on red meat and foods that have a lot of added sugar. Lifestyle changes  Changes may include getting at least 40 minutes of aerobic exercise 3 times a week. Aerobic exercises include walking, biking, and swimming. Aerobic exercise along with a healthy diet can help you maintain a healthy weight.  Changes may also include quitting smoking. Medicines  Medicines are usually given if diet and lifestyle changes have failed to reduce your cholesterol to healthy levels.  Your health care provider may prescribe a statin medicine. Statin medicines have been shown to reduce cholesterol, which can reduce the risk of heart disease. Follow these  instructions at home: Eating and drinking If told by your health care provider:  Eat chicken (without skin), fish, veal, shellfish, ground [place] breast, and round or loin cuts of red meat.  Do not eat fried foods or fatty meats, such as hot dogs and salami.  Eat plenty of fruits, such as apples.  Eat plenty of vegetables, such as broccoli, potatoes, and carrots.  Eat beans, peas, and lentils.  Eat grains such as barley, rice, couscous, and bulgur wheat.  Eat pasta without cream sauces.  Use skim or nonfat milk, and eat low-fat or nonfat yogurt and cheeses.  Do not eat or drink whole milk, cream, ice cream, egg yolks, or hard cheeses.  Do not eat stick margarine or tub margarines that contain trans fats (also called partially hydrogenated oils).  Do not eat saturated tropical oils, such as coconut oil and palm oil.  Do not eat cakes, cookies, crackers, or other baked goods that contain trans fats.  General instructions  Exercise as directed by your health care provider. Increase your activity level with activities such as gardening, walking, and taking the stairs.  Take over-the-counter and prescription medicines only as told by your health care provider.  Do not use any products that contain nicotine or tobacco, such as cigarettes and e-cigarettes. If you need help quitting, ask your health care provider.  Keep all follow-up visits  as told by your health care provider. This is important. Contact a health care provider if:  You are struggling to maintain a healthy diet or weight.  You need help to start on an exercise program.  You need help to stop smoking. Get help right away if:  You have chest pain.  You have trouble breathing. This information is not intended to replace advice given to you by your health care provider. Make sure you discuss any questions you have with your health care provider. Document Revised: 05/05/2017 Document Reviewed: 10/31/2015 Elsevier  Patient Education  [place].

## 2020-03-11 ENCOUNTER — Encounter: Payer: Medicare HMO | Admitting: Family Medicine

## 2020-03-11 MED ORDER — SUMATRIPTAN SUCCINATE 100 MG PO TABS: 100.0000 mg | ORAL_TABLET | ORAL | 1 refills | Status: DC | PRN

## 2020-03-11 NOTE — Telephone Encounter (Signed)
Left message to have patient call office back about Rx

## 2020-03-11 NOTE — Telephone Encounter (Signed)
Ok for Sumatriptan 100mg , 1 tab PO PRN migraine, #10, 1 refill

## 2020-03-11 NOTE — Telephone Encounter (Signed)
Pt called in checking on the PA, she states that she has a migraine today and has no meds. Please advise

## 2020-03-11 NOTE — Telephone Encounter (Signed)
Per PA pt needs to try one of the following medications: SUMATRIPTAN SUCCINATE or NARATRIPTAN

## 2020-03-11 NOTE — Telephone Encounter (Signed)
Pt was informed per PCP at her father's VV.

## 2020-03-11 NOTE — Addendum Note (Signed)
Addended by: Desmond Dike L on: 03/11/2020 11:13 AM   Modules accepted: Orders

## 2020-03-12 ENCOUNTER — Encounter: Payer: Medicare HMO | Admitting: Family Medicine

## 2020-03-16 NOTE — Unmapped (Signed)
[facility] Clinical Assessment & Refill Coordination Note    Autumn Mcdonald, DOB: 1970/11/25  Phone: [phone] (home)     All above HIPAA information was verified with patient.     Was a Nurse, learning disability used for this call? No    Specialty Medication(s):   Inflammatory Disorders: Skyrizi     Current Outpatient Medications   Medication Sig Dispense Refill   ??? ARIPiprazole (ABILIFY) 10 MG tablet Take 10 mg by mouth daily.     ??? armodafinil (NUVIGIL) 250 mg tablet Take 250 mg by mouth three (3) times a day.      ??? betamethasone dipropionate (DIPROLENE) 0.05 % ointment Apply topically Two (2) times a day. To affected areas as needed 60 g 5   ??? biotin 10 mg Tab 10 mg per 1 tablet, ORAL, Daily, 0 Refill(s)     ??? buPROPion (WELLBUTRIN XL) 150 MG 24 hr tablet Take 150 mg by mouth.     ??? busPIRone (BUSPAR) 30 MG tablet Take 30 mg by mouth.     ??? calcium citrate (CALCITRATE) 200 mg elem calcium (950 mg) tablet 950 mg per 1 tablet, ORAL, Daily, 60 tablet, 0 Refill(s)     ??? clonazePAM (KLONOPIN) 1 MG tablet TAKE 1 TABLET BY MOUTH EVERY DAY     ??? cyclobenzaprine (FLEXERIL) 10 MG tablet Take 10 mg by mouth once as needed.      ??? diclofenac sodium (VOLTAREN) 1 % gel Apply 2-4 g topically Four (4) times a day. 300 g 11   ??? DULoxetine (CYMBALTA) 60 MG capsule Take 60 mg by mouth daily. 120 mg daily     ??? ferrous sulfate 325 (65 FE) MG tablet Take 1 tablet (325 mg total) by mouth daily with breakfast.  6   ??? fluconazole (DIFLUCAN) 100 MG tablet Take 100 mg by mouth.     ??? furosemide (LASIX) 80 MG tablet TAKE 1 TABLET BY MOUTH DAILY. can TAKE 1 extra TABLET IF weigh INCREASE by 5lb AND call your doctor FOR further instruction     ??? gabapentin (NEURONTIN) 600 MG tablet Take 2 tablets (1,200 mg total) by mouth Three (3) times a day. 180 tablet 11   ??? nebivolol (BYSTOLIC) 10 MG tablet Take 10 mg by mouth daily.      ??? NYSTOP 100,000 unit/gram powder APPLY 4 grams TO THE AFFECTED AREA 4 TIMES DAILY     ??? OXcarbazepine (TRILEPTAL) 300 MG tablet Take 300 mg by mouth.     ??? pantoprazole (PROTONIX) 40 MG tablet Take 40 mg by mouth Two (2) times a day. bid     ??? pentoxifylline (TRENTAL) 400 mg CR tablet Take 1 tablet (400 mg total) by mouth Three (3) times a day with a meal. 270 tablet 3   ??? promethazine (PHENERGAN) 25 MG tablet once as needed.      ??? risankizumab-rzaa (SKYRIZI) 150 mg/mL Syrg Inject the contents of 1 syringe (150 mg) under the skin every 12 weeks. 1 mL 1   ??? rosuvastatin (CRESTOR) 20 MG tablet TAKE 1 TABLET BY MOUTH EVERY DAY       No current facility-administered medications for this visit.        Changes to medications: Catie reports starting the following medications: antibiotic for flare of Cellulitis    Allergies   Allergen Reactions   ??? Iohexol Hives     Pt treated with PO benedryl   ??? Keflex [Cephalexin] Shortness Of Breath and Hives   ???  Lincomycin Hcl Hives   ??? Lisinopril Hives     Hives  Hives   ??? Sulfa (Sulfonamide Antibiotics) Swelling and Rash   ??? Sulfamethoxazole-Trimethoprim Hives     REACTION: Rash, throat closed, eyes swelled.  Hives  Hives   ??? Aspirin      REACTION: Hives  High Doses only. Causes rash.  High Doses only. Causes rash.   ??? Clindamycin      Hives  Hives   ??? Doxycycline      REACTION: Hives  Hives  Hives   ??? Naproxen      REACTION: Hives  Eyes well  Eyes well   ??? Niacin      REACTION: Swelling, problems breathing   ??? Benzoin Compound Rash   ??? Pneumococcal 23-Val Ps Vaccine Rash     cellulitis       Changes to allergies: No    SPECIALTY MEDICATION ADHERENCE     Skyrizi 150 mg: 18 days of medicine on hand     Medication Adherence    Patient reported X missed doses in the last month: 0  Specialty Medication: Skyrizi   Patient is on additional specialty medications: No  Informant: patient  Confirmed plan for next specialty medication refill: delivery by pharmacy          Specialty medication(s) dose(s) confirmed: Regimen is correct and unchanged.     Are there any concerns with adherence? No    Adherence counseling provided? Not needed    CLINICAL MANAGEMENT AND INTERVENTION      Clinical Benefit Assessment:    Do you feel the medicine is effective or helping your condition? Yes    Clinical Benefit counseling provided? Progress note from 02/05/20 shows evidence of clinical benefit    Adverse Effects Assessment:    Are you experiencing any side effects? No    Are you experiencing difficulty administering your medicine? No    Quality of Life Assessment:    How many days over the past month did your psoriasis  keep you from your normal activities? For example, brushing your teeth or getting up in the morning. 0    Have you discussed this with your provider? Not needed    Therapy Appropriateness:    Is therapy appropriate? Yes, therapy is appropriate and should be continued    DISEASE/MEDICATION-SPECIFIC INFORMATION      For patients on injectable medications: Patient currently has 0 doses left.  Next injection is scheduled for 04/03/20.    PATIENT SPECIFIC NEEDS     - Does the patient have any physical, cognitive, or cultural barriers? No    - Is the patient high risk? No    - Does the patient require a Care Management Plan? No     - Does the patient require physician intervention or other additional services (i.e. nutrition, smoking cessation, social work)? No      SHIPPING     Specialty Medication(s) to be Shipped:   Inflammatory Disorders: Skyrizi    Other medication(s) to be shipped: No additional medications requested for fill at this time     Changes to insurance: No    Delivery Scheduled: Yes, Expected medication delivery date: 03/31/20.     Medication will be delivered via UPS to the confirmed prescription address in [facility].    The patient will receive a drug information handout for each medication shipped and additional FDA Medication Guides as required.  Verified that patient has previously received a Conservation officer, historic buildings.  All of the patient's questions and concerns have been addressed.    Barnet Benavides Vangie Bicker   [facility] Specialty Pharmacist

## 2020-03-17 ENCOUNTER — Encounter: Payer: Self-pay | Admitting: Family Medicine

## 2020-03-17 ENCOUNTER — Telehealth (INDEPENDENT_AMBULATORY_CARE_PROVIDER_SITE_OTHER): Payer: Medicare HMO | Admitting: Family Medicine

## 2020-03-17 DIAGNOSIS — B369 Superficial mycosis, unspecified: Secondary | ICD-10-CM

## 2020-03-17 DIAGNOSIS — L03119 Cellulitis of unspecified part of limb: Secondary | ICD-10-CM | POA: Diagnosis not present

## 2020-03-17 MED ORDER — NYSTATIN 100000 UNIT/GM EX POWD
1.0000 | Freq: Three times a day (TID) | CUTANEOUS | 3 refills | Status: DC
Start: 2020-03-17 — End: 2021-06-10

## 2020-03-17 MED ORDER — MUPIROCIN 2 % EX OINT
TOPICAL_OINTMENT | CUTANEOUS | 1 refills | Status: DC
Start: 2020-03-17 — End: 2020-04-20

## 2020-03-17 NOTE — Progress Notes (Signed)
Virtual Visit via Video   I connected with patient on 03/17/20 at 10:30 AM EDT by a video enabled telemedicine application and verified that I am speaking with the correct person using two identifiers.  Location patient: Home Location provider: [facility], Office Persons participating in the virtual visit: Patient, Provider, [place] (Sabrina M)  I discussed the limitations of evaluation and management by telemedicine and the availability of in person appointments. The patient expressed understanding and agreed to proceed.  Subjective:   HPI:   Cellulitis- pt reports her legs are now scabbing over.  Legs worsened 3-4 weeks ago.  She states she had 1 area that was 'open and oozing' but this scabbed over using peroxide.  No redness or streaking that she had previously.  Pt denies pain unless bumps into something.  No fevers.  Pt has appt upcoming w/ Dermatology next week at [facility].  Yeast dermatitis- pt reports she needs refill on Nystatin powder  ROS:   See pertinent positives and negatives per HPI.  Patient Active Problem List   Diagnosis Date Noted  . Hyponatremia 07/27/2019  . Urinary urgency 04/04/2018  . Chronic narcotic use 09/07/2016  . Acute blood loss anemia 01/14/2016  . Multiple gastric ulcers   . PAN (polyarteritis nodosa) ([place]) 11/24/2015  . GERD (gastroesophageal reflux disease) 01/08/2014  . Routine general medical examination at a health care facility 04/23/2013  . Bariatric surgery status 10/31/2012  . Psoriasis 01/09/2012  . DDD (degenerative disc disease), lumbar 11/30/2011  . Migraine headache   . [name] (obstructive sleep apnea) 11/16/2010  . HYPERGLYCEMIA, FASTING 10/08/2009  . CONTRACTURE OF TENDON 08/17/2009  . PARESTHESIA, HANDS 12/23/2008  . Leg pain, right 11/05/2008  . ADVERSE DRUG REACTION 04/03/2008  . PERIPHERAL EDEMA 03/26/2008  . Morbid obesity ([place]) 01/03/2007  . Cellulitis 01/03/2007  . Hyperlipidemia 09/22/2006  . Fibromyalgia  09/20/2006  . Anxiety and depression 06/28/2006    Social History   Tobacco Use  . Smoking status: Never Smoker  . Smokeless tobacco: Never Used  Substance Use Topics  . Alcohol use: No    Current Outpatient Medications:  .  ARIPiprazole (ABILIFY) 10 MG tablet, Take 10 mg by mouth in the morning. , Disp: , Rfl:  .  Armodafinil 250 MG tablet, 3 (three) times daily. 8am, 2pm, 6pm, Disp: , Rfl:  .  Biotin 10 MG TABS, Take 10 mg by mouth in the morning. , Disp: , Rfl:  .  buPROPion (WELLBUTRIN XL) 300 MG 24 hr tablet, TAKE 1 TABLET BY MOUTH EVERY DAY, Disp: 30 tablet, Rfl: 3 .  busPIRone (BUSPAR) 30 MG tablet, TAKE 1 TABLET BY MOUTH 2 TIMES DAILY, Disp: 60 tablet, Rfl: 0 .  calcium citrate (CALCITRATE - DOSED IN MG ELEMENTAL CALCIUM) 950 MG tablet, Take 1 tablet by mouth daily., Disp: , Rfl:  .  clobetasol cream (TEMOVATE) 1.30 %, Apply 1 application topically 2 (two) times daily. Cover both legs with Colbetasol cream, Disp: 30 g, Rfl: 0 .  clonazePAM (KLONOPIN) 1 MG tablet, TAKE 1 TABLET BY MOUTH EVERY DAY (Patient taking differently: at bedtime. ), Disp: 30 tablet, Rfl: 3 .  collagenase (SANTYL) ointment, Apply topically daily. apply Santyl to scabs on legs, Disp: 15 g, Rfl: 0 .  cyclobenzaprine (FLEXERIL) 10 MG tablet, TAKE 1 TABLET BY MOUTH 3 TIMES DAILY AS NEEDED, Disp: 270 tablet, Rfl: 0 .  DULoxetine (CYMBALTA) 60 MG capsule, Take 2 capsules (120 mg total) by mouth daily., Disp: 240 capsule, Rfl: 2 .  FEROSUL 325 (65 Fe) MG tablet, TAKE 1 TABLET BY MOUTH EVERY DAY WITH BREAKFAST, Disp: 30 tablet, Rfl: 6 .  furosemide (LASIX) 80 MG tablet, TAKE 1 TABLET BY MOUTH DAILY. can TAKE 1 extra TABLET IF weight INCREASE by 5lb AND call your doctor FOR further instruction, Disp: 30 tablet, Rfl: 0 .  gabapentin (NEURONTIN) 600 MG tablet, TAKE 2 TABLETS BY MOUTH 3 TIMES DAILY, Disp: 270 tablet, Rfl: 0 .  HYDROcodone-acetaminophen (NORCO) 10-325 MG tablet, Take 1 tablet by mouth every 6 (six) hours  as needed., Disp: 90 tablet, Rfl: 0 .  irbesartan (AVAPRO) 75 MG tablet, TAKE 1 TABLET BY MOUTH EVERY DAY, Disp: 30 tablet, Rfl: 0 .  meclizine (ANTIVERT) 25 MG tablet, Take 1 tablet (25 mg total) by mouth 3 (three) times daily as needed for dizziness., Disp: 30 tablet, Rfl: 0 .  nystatin (MYCOSTATIN/NYSTOP) powder, Apply 1 application topically 3 (three) times daily., Disp: 60 g, Rfl: 3 .  Oxcarbazepine (TRILEPTAL) 300 MG tablet, Take 300 mg by mouth 2 (two) times daily., Disp: , Rfl:  .  pantoprazole (PROTONIX) 40 MG tablet, TAKE 1 TABLET BY MOUTH 2 TIMES DAILY, Disp: 180 tablet, Rfl: 0 .  pentoxifylline (TRENTAL) 400 MG CR tablet, Take 400 mg by mouth 3 (three) times daily with meals. , Disp: , Rfl: 11 .  promethazine (PHENERGAN) 25 MG tablet, TAKE 1 TABLET BY MOUTH EVERY 6 HOURS AS NEEDED FOR NAUSEA AND VOMITING, Disp: 45 tablet, Rfl: 3 .  rosuvastatin (CRESTOR) 20 MG tablet, TAKE 1 TABLET BY MOUTH EVERY DAY, Disp: 90 tablet, Rfl: 0 .  SKYRIZI, 150 MG DOSE, 75 MG/0.83ML PSKT, Inject 150 mg into the skin every 3 (three) months. , Disp: , Rfl:  .  SUMAtriptan (IMITREX) 100 MG tablet, Take 1 tablet (100 mg total) by mouth every 2 (two) hours as needed for migraine. May repeat in 2 hours if headache persists or recurs., Disp: 10 tablet, Rfl: 1 .  BYSTOLIC 10 MG tablet, TAKE 1 TABLET BY MOUTH EVERY DAY, Disp: 90 tablet, Rfl: 0 .  ciprofloxacin (CIPRO) 500 MG tablet, Take 1 tablet (500 mg total) by mouth 2 (two) times daily., Disp: 14 tablet, Rfl: 0 .  fluconazole (DIFLUCAN) 100 MG tablet, Take 1 tablet (100 mg total) by mouth daily. (Patient not taking: Reported on 03/05/2020), Disp: 7 tablet, Rfl: 0 .  mupirocin ointment (BACTROBAN) 2 %, APPLY TOPICALLY 2 TIMES DAILY (Patient not taking: Reported on 03/17/2020), Disp: 22 g, Rfl: 1 .  senna-docusate (SENOKOT-S) 8.6-50 MG tablet, Take 1 tablet by mouth at bedtime. (Patient not taking: Reported on 03/17/2020), Disp: 30 tablet, Rfl: 0  Allergies    Allergen Reactions  . Niacin Anaphylaxis, Swelling and Other (See Comments)    REACTION: Swelling, problems breathing  . Sulfamethoxazole-Trimethoprim Anaphylaxis, Swelling, Rash and Other (See Comments)    REACTION: Rash, throat closed, eyes swelled.  . Aspirin Hives  . Bactrim Hives  . Benzoin Compound Rash  . Cephalexin Hives    Tolerated ceftriaxone and cefepime 07/2019  . Clindamycin/Lincomycin Hives  . Doxycycline Hives  . Iohexol Hives    Pt treated with PO benedryl  . Lisinopril Hives  . Naproxen Hives  . Pnu-Imune [Pneumococcal Polysaccharide Vaccine] Rash  . Sulfonamide Derivatives Rash    Objective:   There were no vitals taken for this visit. AAOx3, NAD, obese NCAT, EOMI No obvious CN deficits edentulous Coloring WNL Pt is able to speak clearly, coherently without shortness of breath or increased work of  breathing.  Thought process is linear.  Mood is appropriate.  Not able to show me her legs on camera  Assessment and Plan:   Cellulitis- recurrent problem for pt due to ongoing venous stasis dermatitis.  Thankfully, it does not sound at this time that legs are infected and her venous stasis dermatitis is scabbed and starting to heal.  Given her multiple allergies to antibiotics will provide mupirocin ointment to use as needed on open sores rather than starting oral abx.  Reviewed supportive care and red flags that should prompt return.  Pt expressed understanding and is in agreement w/ plan.   Fungal dermatitis- refill provided on Nystatin powder.  [name], MD 03/17/2020

## 2020-03-23 ENCOUNTER — Encounter: Payer: Self-pay | Admitting: Family Medicine

## 2020-03-25 ENCOUNTER — Other Ambulatory Visit: Payer: Self-pay | Admitting: Family Medicine

## 2020-03-26 ENCOUNTER — Other Ambulatory Visit: Payer: Self-pay | Admitting: Family Medicine

## 2020-03-26 NOTE — Telephone Encounter (Signed)
Last OV 11/2 (virtual) Last refills 02/26/20      90 tabs- 0 refill

## 2020-03-27 ENCOUNTER — Encounter: Admit: 2020-03-27 | Discharge: 2020-03-28 | Payer: MEDICARE

## 2020-03-27 DIAGNOSIS — L304 Erythema intertrigo: Principal | ICD-10-CM

## 2020-03-27 DIAGNOSIS — L03116 Cellulitis of left lower limb: Principal | ICD-10-CM

## 2020-03-27 DIAGNOSIS — L409 Psoriasis, unspecified: Principal | ICD-10-CM

## 2020-03-27 MED ORDER — NYSTATIN 100,000 UNIT/GRAM TOPICAL OINTMENT
Freq: Two times a day (BID) | TOPICAL | 0 refills | 0.00000 days | Status: CP
Start: 2020-03-27 — End: 2021-03-27

## 2020-03-27 MED ORDER — CLINDAMYCIN HCL 300 MG CAPSULE
ORAL_CAPSULE | Freq: Three times a day (TID) | ORAL | 0 refills | 21.00000 days | Status: CP
Start: 2020-03-27 — End: 2020-04-03

## 2020-03-27 MED ORDER — SKYRIZI 150 MG/ML SUBCUTANEOUS SYRINGE
SUBCUTANEOUS | 1 refills | 0.00000 days | Status: CP
Start: 2020-03-27 — End: ?
  Filled 2020-03-30: qty 1, 84d supply, fill #0

## 2020-03-27 MED ORDER — FLUCONAZOLE 150 MG TABLET
ORAL_TABLET | Freq: Every day | ORAL | 0 refills | 2.00000 days | Status: CP
Start: 2020-03-27 — End: ?

## 2020-03-27 MED ORDER — TRIAMCINOLONE ACETONIDE 0.1 % TOPICAL OINTMENT
Freq: Two times a day (BID) | TOPICAL | 0 refills | 0.00000 days | Status: CP
Start: 2020-03-27 — End: 2021-03-27

## 2020-03-27 NOTE — Unmapped (Addendum)
-   Continue Skyrizi injections  - Start Triamcinalone ointment twice daily for your psoriasis  - Start Clindamycin three times a day for the next week for your leg irritation  - Take Fluconazole once today, then repeat in 3 days   - Start Nystatin ointment twice daily    What should I look for in a sunscreen?  ?? Blocks both UVA and UVB rays (Broad Spectrum)  ?? Sunscreen of SPF 30 or higher  ?? Physical blockers with titanium dioxide or zinc oxide are great at blocking UV rays and are less likely to cause a skin allergy  ?? Water resistant for up to either 40 or 80 minutes.  Sunscreens are not waterproof or sweatproof.  ?? Recommended brands include, but are not limited to:  ?? Vanicream   ?? Blue Lizard  ?? Neutrogena  ?? Aveeno  ?? L'Oreal  ?? Coppertone    When should I use my sunscreen?  ?? Apply sunscreen every day if you will be outside for longer than 10-15 minutes.  ?? UVA rays (cause aging, not burning) penetrate through clouds and windows, including car windows.  ?? We recommend using a lotion with broad spectrum SPF 30 on a daily basis to the face.  ?? Recommended brands include, but are not limited to:  ?? Neutrogena face lotion w/ SPF 30  ?? Eucerin face lotion w/ SPF 30  ?? Aveeno face lotion w/ SPF 30  ?? Cetaphil face lotion w/ SPF 30  ?? Olay face lotion w/ SPF 30  ?? Cerave AM  ?? Elta MD and Michaelle Birks are great, but are much more expensive and do not necessarily work better.      How do I use my sunscreen?  ?? Apply sunscreen liberally.  A good rule of thumb is to use one ounce (enough to fill a shot glass) for the body.  This may change depending on your body size though.    ?? Apply 15 minutes before going outside.  ?? Reapply sunscreen at least every 2 hours, especially after swimming or heavy sweating.  No sunscreens are sweatproof or waterproof.  ?? Remember to protect your lips with a lip balm that contains sunscreen of at least SPF 30.    Are there options besides sunscreen?  ?? Of course!  ?? Avoidance measures:  ?? Avoid peak hours between 10 am to 4 pm, when the UV rays are the strongest.  ?? Limit exposure to reflective surfaces such as water, sand, and snow as UV rays can be reflected from these surfaces.  ?? Protective measures:  ?? Wear a wide-brimmed hat with at least 3 inch brim. [email or web address].[email or web address]  ?? Sun protective clothing is an excellent option.  With clothing, the SPF is called UPF.  Look for a UPF of 30 to 50+.  ?? Recommended brands include: Coolibar, Solumbra, and Sunday Afternoons.  Check out sporting good stores such as [employer] and [employer] as well.    Is there a safe way to tan?  ?? Tanning booths are NOT safer than sun exposure.  They damage skin just as real sunlight does and lead to wrinkling and skin cancer.  ?? Consider sunless tanning products such as Jergens or L'Oreal.  Remember the tan produced by these products does not actually protect you from the sun so continue to apply your sunscreen regularly.

## 2020-03-27 NOTE — Unmapped (Signed)
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  - Suspect morbid obesity contributes to impedence of both venous and lymphatic return resulting in stasis changes (secondary lymphedema)   - Unable to tolerate compression that she initiated after last visit; she has tried multiple other compression garment before including ones with velcro and she is unable to tolerate them.  - Continue Lymphedema massages PRN; insurance will not pay for lymphedema pump  - The EI/NV has remained quiescent for > 1 year    Cellulitis of Mcdonald lower extremity with erosions and ulceration  - clindamycin (CLEOCIN) 300 MG capsule; Take 1 capsule (300 mg total) by mouth Three (3) times a day for 7 days.  - Aerobic Culture  - risks of abx use reviewed  - denies systemic symptoms    Intertrigo (concerned for candidal involvement given severity)  - nystatin (MYCOSTATIN) 100,000 unit/gram ointment; Apply topically Two (2) times a day.  - fluconazole (DIFLUCAN) 150 MG tablet; Take 1 tablet (150 mg total) by mouth daily. Repeat in 3 days  ??  Plaque psoriasis: chronic, stable, well controlled on Skyrizi  - will re-order risankizumab-rzaa (SKYRIZI) 150 mg/mL Syrg; Inject the contents of 1 syringe (150 mg) under the skin every 12 weeks.  - triamcinolone (KENALOG) 0.1 % ointment; Apply topically Two (2) times a day.  - tolerating well without SE  ??  High risk medication use:  - negative quant gold on 09/19/2019    The patient was advised to call for an appointment should any new, changing, or symptomatic lesions develop.     RTC: Return in about 6 months (around 09/24/2020) for Skin check. or sooner as needed   _________________________________________________________________      Chief Complaint     FBSE; Lesions of concern    HPI     Autumn Mcdonald is a 49 y.o. female who presents as a returning patient (last seen by Autumn Mcdonald on 09/19/2019) to [facility] for a full body skin exam. Patient reports several lesions of concern.  At last visit, she was instructed to continue her Skyrizi, Pentoxifylline 400mg  TID, Horse chestnut extract 350 mg BID, and then Gabapentin 1200mg  per Rheum. She was encouraged to use Halobetasol twice daily as needed in addition to starting urea nightly for areas of psoriasis.    Patient trialed new compression socks since last visit, but reports she can only tolerate them for 30 minutes before having to take them off. Unfortunately, she has tried various ones without improvement.    Today, the patient reports Autumn Mcdonald has effectively managed her symptoms, but she has been without her medication currently. She reports her most recent dose was 6 months ago due to the recent changes in the manufacturer dosing in the syringe. Patient denies any issues with injection site reaction    Leg lesion  Location: Medial Mcdonald ankle  Duration: 1.5 weeks  Treatment/Modifying factors: Mupirocin ointment for last 10 days without improvement of her symptoms  Associated signs/ Symptoms:  Redness; Denies pain   Reviewed patient's allergies given concern of cellulitis and need for oral abx.  She has allergies to numerous abx used for SSTI. We reviewed them all and she has been on Clindamycin since it was documented in her allergy list and denies any adverse reaction. Thus, this was removed today off her  list.  - denies fevers, chills, or other systemic symptoms.    Abdominal irritaton  Location: Mcdonald infrapanus  Duration: Flaring for several weeks, intermittently recurs for many years  Treatment/Modifying factors: None  Associated signs/ Symptoms: Irritation        The patient denies any other new or changing lesions or areas of concern.     Pertinent Past Medical History     No history of skin cancer    Problem List Musculoskeletal and Integument    Psoriasis     Last Assessment & Plan:   New.  Start high dose topical steroid ointment.  Reviewed supportive care and red flags that should prompt return.  Pt expressed understanding and is in agreement w/ plan.          Relevant Medications    risankizumab-rzaa (SKYRIZI) 150 mg/mL Syrg    triamcinolone (KENALOG) 0.1 % ointment       Other    S/P skin biopsy - Primary     - Punch biopsies of Mcdonald medial ankle, right lower posterior leg, 11/2014, Autumn Mcdonald  - Punch biopsy of Mcdonald achilles, 08/2016, Autumn Mcdonald  - Punch biopsies of right leg, Mcdonald leg, 12/2016, Autumn Mcdonald  - Punch biopsies of Mcdonald medial ankle, right lower posterior leg, 01/2017, Autumn Mcdonald               Family History:   Positive for melanoma - both parents positive   Both parents with psoriasis     Past Medical History, Family History, Social History, Medication List, Allergies, and Problem List were reviewed in the rooming section of Epic.     ROS: Other than symptoms mentioned in the HPI, no fevers, chills, or other skin complaints    Physical Examination     GENERAL: Well-appearing female in no acute distress, resting comfortably.  NEURO: Alert and oriented, answers questions appropriately  PSYCH: Normal mood and affect  SKIN (Focal Skin Exam): Per patient request, examination of b/l lower legs and Mcdonald abdomen was performed  - Superficial ulceration and a separate erosion on Mcdonald medial lower leg with purulence and hemorrhagic crusting  - Bilateral lower legs with erythematous plaques   - Erythema and maceration of Mcdonald infrapanus abdomen    All areas not commented on are within normal limits or unremarkable    Scribe's Attestation: Autumn Alstrom, MD obtained and performed the history, physical exam and medical decision making elements that were  entered into the chart. Documentation assistance was provided by me personally, a scribe. Signed by Autumn Mcdonald, Scribe, on March 27, 2020 9:42 AM    ----------------------------------------------------------------------------------------------------------------------  March 28, 2020 3:02 PM. Documentation assistance provided by the Scribe. I was present during the time the encounter was recorded. The information recorded by the Scribe was done at my direction and has been reviewed and validated by me.  ----------------------------------------------------------------------------------------------------------------------      (Approved Template 01/27/2020)

## 2020-03-27 NOTE — Telephone Encounter (Signed)
Last OV 11/2 (virtual) Last refill 01/07/20       270 tabs/ 0 refill

## 2020-03-30 MED FILL — SKYRIZI 150 MG/ML SUBCUTANEOUS SYRINGE: 84 days supply | Qty: 1 | Fill #0 | Status: AC

## 2020-03-30 NOTE — Unmapped (Signed)
MSSA susceptible to clindamycin, which the patient has started.

## 2020-04-01 NOTE — Unmapped (Signed)
Final result 1+ MSSA, pt on clinda which is appropriate

## 2020-04-02 ENCOUNTER — Encounter: Payer: Medicare HMO | Admitting: Family Medicine

## 2020-04-08 ENCOUNTER — Encounter: Payer: Self-pay | Admitting: Family Medicine

## 2020-04-18 ENCOUNTER — Other Ambulatory Visit: Payer: Self-pay | Admitting: Family Medicine

## 2020-04-19 ENCOUNTER — Other Ambulatory Visit: Payer: Self-pay | Admitting: Family Medicine

## 2020-04-20 ENCOUNTER — Telehealth: Payer: Self-pay

## 2020-04-20 ENCOUNTER — Encounter: Payer: Self-pay | Admitting: Family Medicine

## 2020-04-20 ENCOUNTER — Other Ambulatory Visit: Payer: Self-pay

## 2020-04-20 ENCOUNTER — Encounter: Payer: Medicare HMO | Admitting: Family Medicine

## 2020-04-20 DIAGNOSIS — I890 Lymphedema, not elsewhere classified: Secondary | ICD-10-CM

## 2020-04-20 DIAGNOSIS — F419 Anxiety disorder, unspecified: Secondary | ICD-10-CM

## 2020-04-20 MED ORDER — BUSPIRONE HCL 30 MG PO TABS: 30.0000 mg | ORAL_TABLET | Freq: Two times a day (BID) | ORAL | 0 refills | Status: DC

## 2020-04-20 MED ORDER — FUROSEMIDE 80 MG PO TABS: ORAL_TABLET | ORAL | 0 refills | Status: DC

## 2020-04-20 NOTE — Telephone Encounter (Signed)
Spoke with patient about refills. Lasix has been refilled and Flexeril has been denied because refill is too soon. Pt understood.

## 2020-04-20 NOTE — Telephone Encounter (Signed)
Spoke with pat, have a sever migraine HA; not safe to drive. Appt rescheduled to 12/30 at 7:30.

## 2020-04-20 NOTE — Telephone Encounter (Signed)
Spoke with pt on concerns of Rx. Patient voiced understanding.

## 2020-04-21 ENCOUNTER — Other Ambulatory Visit: Payer: Self-pay | Admitting: Family Medicine

## 2020-04-22 ENCOUNTER — Other Ambulatory Visit: Payer: Self-pay | Admitting: Family Medicine

## 2020-04-22 NOTE — Telephone Encounter (Signed)
LFD 03/26/20 #90 with no refills LOV 03/17/20 NOV 05/14/20

## 2020-04-23 ENCOUNTER — Telehealth: Payer: Self-pay

## 2020-04-23 MED ORDER — HYDROCODONE-ACETAMINOPHEN 10-325 MG PO TABS: 1.0000 | ORAL_TABLET | Freq: Four times a day (QID) | ORAL | 0 refills | Status: DC | PRN

## 2020-04-23 NOTE — Progress Notes (Unsigned)
Chronic Care Management Pharmacy Assistant   Name: [name]  MRN: [id number] DOB: 01/22/1971  Reason for Encounter: Disease State   PCP : [name], MD  Allergies:   Allergies  Allergen Reactions  . Niacin Anaphylaxis, Swelling and Other (See Comments)    REACTION: Swelling, problems breathing  . Sulfamethoxazole-Trimethoprim Anaphylaxis, Swelling, Rash and Other (See Comments)    REACTION: Rash, throat closed, eyes swelled.  . Aspirin Hives  . Bactrim Hives  . Benzoin Compound Rash  . Cephalexin Hives    Tolerated ceftriaxone and cefepime 07/2019  . Clindamycin/Lincomycin Hives  . Doxycycline Hives  . Iohexol Hives    Pt treated with PO benedryl  . Lisinopril Hives  . Naproxen Hives  . Pnu-Imune [Pneumococcal Polysaccharide Vaccine] Rash  . Sulfonamide Derivatives Rash    Medications: Outpatient Encounter Medications as of 04/23/2020  Medication Sig  . ARIPiprazole (ABILIFY) 10 MG tablet Take 10 mg by mouth in the morning.   . Armodafinil 250 MG tablet 3 (three) times daily. 8am, 2pm, 6pm  . Biotin 10 MG TABS Take 10 mg by mouth in the morning.   Marland Kitchen buPROPion (WELLBUTRIN XL) 300 MG 24 hr tablet TAKE 1 TABLET BY MOUTH EVERY DAY  . busPIRone (BUSPAR) 30 MG tablet Take 1 tablet (30 mg total) by mouth 2 (two) times daily.  Marland Kitchen BYSTOLIC 10 MG tablet TAKE 1 TABLET BY MOUTH EVERY DAY  . calcium citrate (CALCITRATE - DOSED IN MG ELEMENTAL CALCIUM) 950 MG tablet Take 1 tablet by mouth daily.  . ciprofloxacin (CIPRO) 500 MG tablet Take 1 tablet (500 mg total) by mouth 2 (two) times daily.  . clobetasol cream (TEMOVATE) 0.17 % Apply 1 application topically 2 (two) times daily. Cover both legs with Colbetasol cream  . clonazePAM (KLONOPIN) 1 MG tablet TAKE 1 TABLET BY MOUTH EVERY DAY (Patient taking differently: at bedtime. )  . collagenase (SANTYL) ointment Apply topically daily. apply Santyl to scabs on legs  . cyclobenzaprine (FLEXERIL) 10 MG tablet TAKE 1 TABLET BY  MOUTH 3 TIMES DAILY AS NEEDED  . DULoxetine (CYMBALTA) 60 MG capsule Take 2 capsules (120 mg total) by mouth daily.  . FEROSUL 325 (65 Fe) MG tablet TAKE 1 TABLET BY MOUTH EVERY DAY WITH BREAKFAST  . fluconazole (DIFLUCAN) 100 MG tablet Take 1 tablet (100 mg total) by mouth daily. (Patient not taking: Reported on 03/05/2020)  . furosemide (LASIX) 80 MG tablet TAKE 1 TABLET BY MOUTH DAILY. can TAKE 1 extra TABLET IF weight INCREASE by 5lb AND call your doctor FOR further instruction  . gabapentin (NEURONTIN) 600 MG tablet TAKE 2 TABLETS BY MOUTH 3 TIMES DAILY  . HYDROcodone-acetaminophen (NORCO) 10-325 MG tablet Take 1 tablet by mouth every 6 (six) hours as needed.  . irbesartan (AVAPRO) 75 MG tablet TAKE 1 TABLET BY MOUTH EVERY DAY  . meclizine (ANTIVERT) 25 MG tablet Take 1 tablet (25 mg total) by mouth 3 (three) times daily as needed for dizziness.  . mupirocin ointment (BACTROBAN) 2 % APPLY TOPICALLY 2 TIMES DAILY  . nystatin (MYCOSTATIN/NYSTOP) powder Apply 1 application topically 3 (three) times daily.  . Oxcarbazepine (TRILEPTAL) 300 MG tablet Take 300 mg by mouth 2 (two) times daily.  . pantoprazole (PROTONIX) 40 MG tablet TAKE 1 TABLET BY MOUTH 2 TIMES DAILY  . pentoxifylline (TRENTAL) 400 MG CR tablet Take 400 mg by mouth 3 (three) times daily with meals.   . promethazine (PHENERGAN) 25 MG tablet TAKE 1 TABLET BY  MOUTH EVERY 6 HOURS AS NEEDED FOR NAUSEA AND VOMITING  . rosuvastatin (CRESTOR) 20 MG tablet TAKE 1 TABLET BY MOUTH EVERY DAY  . senna-docusate (SENOKOT-S) 8.6-50 MG tablet Take 1 tablet by mouth at bedtime. (Patient not taking: Reported on 03/17/2020)  . SKYRIZI, 150 MG DOSE, 75 MG/0.83ML PSKT Inject 150 mg into the skin every 3 (three) months.   . SUMAtriptan (IMITREX) 100 MG tablet Take 1 tablet (100 mg total) by mouth every 2 (two) hours as needed for migraine. May repeat in 2 hours if headache persists or recurs.   No facility-administered encounter medications on file as of  04/23/2020.    Current Diagnosis: Patient Active Problem List   Diagnosis Date Noted  . Hyponatremia 07/27/2019  . Urinary urgency 04/04/2018  . Chronic narcotic use 09/07/2016  . Acute blood loss anemia 01/14/2016  . Multiple gastric ulcers   . PAN (polyarteritis nodosa) ([place]) 11/24/2015  . GERD (gastroesophageal reflux disease) 01/08/2014  . Routine general medical examination at a health care facility 04/23/2013  . Bariatric surgery status 10/31/2012  . Psoriasis 01/09/2012  . DDD (degenerative disc disease), lumbar 11/30/2011  . Migraine headache   . [name] (obstructive sleep apnea) 11/16/2010  . HYPERGLYCEMIA, FASTING 10/08/2009  . CONTRACTURE OF TENDON 08/17/2009  . PARESTHESIA, HANDS 12/23/2008  . Leg pain, right 11/05/2008  . ADVERSE DRUG REACTION 04/03/2008  . PERIPHERAL EDEMA 03/26/2008  . Morbid obesity ([place]) 01/03/2007  . Cellulitis 01/03/2007  . Hyperlipidemia 09/22/2006  . Fibromyalgia 09/20/2006  . Anxiety and depression 06/28/2006    Reviewed chart prior to disease state call. Spoke with patient regarding BP  Recent Office Vitals: BP Readings from Last 3 Encounters:  12/11/19 126/79  10/03/19 (!) 143/98  09/25/19 (!) 135/91   Pulse Readings from Last 3 Encounters:  12/11/19 82  10/03/19 76  09/25/19 71    Wt Readings from Last 3 Encounters:  12/11/19 (!) 353 lb (160.1 kg)  11/07/19 (!) 338 lb (153.3 kg)  10/03/19 (!) 343 lb (155.6 kg)     Kidney Function Lab Results  Component Value Date/Time   CREATININE 0.59 09/06/2019 10:38 AM   CREATININE 0.65 08/14/2019 10:59 AM   CREATININE 0.86 02/27/2013 03:19 PM   CREATININE 0.77 02/13/2013 11:50 AM   GFR 108.00 09/06/2019 10:38 AM   GFRNONAA >60 08/02/2019 02:09 AM   GFRNONAA 83 02/27/2013 03:19 PM   GFRAA >60 08/02/2019 02:09 AM   GFRAA >89 02/27/2013 03:19 PM    BMP Latest Ref Rng & Units 09/06/2019 08/14/2019 08/02/2019  Glucose 70 - 99 mg/dL 93 83 93  BUN 6 - 23 mg/dL 8 12 22(H)  Creatinine  0.40 - 1.20 mg/dL 0.59 0.65 0.69  Sodium 135 - 145 mEq/L 130(L) 135 136  Potassium 3.5 - 5.1 mEq/L 3.7 4.2 3.5  Chloride 96 - 112 mEq/L 89(L) 96 93(L)  CO2 19 - 32 mEq/L 33(H) 32 28  Calcium 8.4 - 10.5 mg/dL 8.4 9.1 9.4    . Current antihypertensive regimen:                 Armodafinil 250 MG tablet                irbesartan (AVAPRO) 75 MG tablet  . How often are you checking your Blood Pressure? {CHL HP BP Monitoring Frequency:[id number]} . Current home BP readings: *** . What recent interventions/DTPs have been made by any provider to improve Blood Pressure control since last CPP Visit: N/A . Any recent hospitalizations or  ED visits since last visit with CPP? {yes/no:20286} . What diet changes have been made to improve Blood Pressure Control?  o *** . What exercise is being done to improve your Blood Pressure Control?  o ***  Adherence Review: Is the patient currently on ACE/ARB medication? {yes/no:20286} Does the patient have >5 day gap between last estimated fill dates? {yes/no:20286}   [place]  Follow-Up:  {[facility]stream CPA Follow-up:24147}

## 2020-04-23 NOTE — Telephone Encounter (Signed)
LFD 03/26/20 #90 with no refills LOV 03/17/20 NOV 05/14/20

## 2020-04-26 ENCOUNTER — Encounter: Payer: Self-pay | Admitting: Family Medicine

## 2020-05-01 ENCOUNTER — Encounter: Payer: Self-pay | Admitting: Family Medicine

## 2020-05-07 ENCOUNTER — Encounter: Payer: Self-pay | Admitting: Family Medicine

## 2020-05-08 ENCOUNTER — Other Ambulatory Visit: Payer: Self-pay | Admitting: Family Medicine

## 2020-05-08 DIAGNOSIS — I890 Lymphedema, not elsewhere classified: Secondary | ICD-10-CM

## 2020-05-11 ENCOUNTER — Encounter: Payer: Self-pay | Admitting: Family Medicine

## 2020-05-12 MED ORDER — FLUCONAZOLE 100 MG PO TABS: 100.0000 mg | ORAL_TABLET | Freq: Every day | ORAL | 0 refills | Status: DC

## 2020-05-14 ENCOUNTER — Encounter: Payer: Medicare HMO | Admitting: Family Medicine

## 2020-05-14 ENCOUNTER — Other Ambulatory Visit: Payer: Self-pay | Admitting: Family Medicine

## 2020-05-14 DIAGNOSIS — F32A Depression, unspecified: Secondary | ICD-10-CM

## 2020-05-18 ENCOUNTER — Other Ambulatory Visit: Payer: Self-pay | Admitting: Family Medicine

## 2020-05-18 MED ORDER — HYDROCODONE-ACETAMINOPHEN 10-325 MG PO TABS: 1.0000 | ORAL_TABLET | Freq: Four times a day (QID) | ORAL | 0 refills | Status: DC | PRN

## 2020-05-18 NOTE — Telephone Encounter (Signed)
LFD 04/23/20 #90 with 0 refills LOV 03/17/20 NOV 05/19/20

## 2020-05-19 ENCOUNTER — Encounter: Payer: Self-pay | Admitting: Family Medicine

## 2020-05-19 ENCOUNTER — Other Ambulatory Visit: Payer: Self-pay

## 2020-05-19 ENCOUNTER — Ambulatory Visit (INDEPENDENT_AMBULATORY_CARE_PROVIDER_SITE_OTHER): Payer: Medicare Other | Admitting: Family Medicine

## 2020-05-19 VITALS — BP 130/80 | HR 67 | Temp 98.1°F | Resp 20 | Ht 63.0 in | Wt 320.6 lb

## 2020-05-19 DIAGNOSIS — L03119 Cellulitis of unspecified part of limb: Secondary | ICD-10-CM | POA: Diagnosis not present

## 2020-05-19 DIAGNOSIS — R600 Localized edema: Secondary | ICD-10-CM | POA: Diagnosis not present

## 2020-05-19 MED ORDER — AMOXICILLIN 875 MG PO TABS: 875.0000 mg | ORAL_TABLET | Freq: Two times a day (BID) | ORAL | 0 refills | Status: DC

## 2020-05-19 NOTE — Assessment & Plan Note (Signed)
Recurrent issue for pt.  Given appearance and drainage of lower leg will start Amoxicillin to cover MSSA.  Again, will refer to wound center for assistance w/ management.

## 2020-05-19 NOTE — Assessment & Plan Note (Signed)
Pt is down 23 lbs since July.  Reports she is feeling better.  Encouraged her to continue

## 2020-05-19 NOTE — Patient Instructions (Signed)
Schedule your complete physical in 3 months We'll call you with your Wound Center referral START the Amoxicillin twice daily- take w/ food Elevate your legs when sitting Use ACE wrap or compression socks to help w/ swelling Keep up the good work on weight loss!  You're doing great! Call with any questions or concerns Hang in there!

## 2020-05-19 NOTE — Assessment & Plan Note (Signed)
Ongoing issue for pt.  Unclear if this is solely venous stasis or lymphedema or combination.  Will refer to wound center to help w/ management as she gets recurrent cellulitis on a regular basis.  Pt expressed understanding and is in agreement w/ plan.

## 2020-05-19 NOTE — Progress Notes (Signed)
   Subjective:    Patient ID: [name], female    DOB: August 01, 1970, 50 y.o.   MRN: [id number]  HPI Weeping LEs- pt reports they have been draining 'for a couple of weeks'.  L lower leg- just above the ankle- is deep red, almost purple.  Weeping clear fluid.  Pt has a similar spot on R posterior calf.  Sees Derm and was given abx and told that if it 'didn't clear w/in 20 days go see your doctor'.  Culture on 03/27/20 grew MSSA and she was treated w/ 7 days of Clindamycin.  Area was improving until recently.  Morbid obesity- pt is down 23 lbs since July.  Pt has changed diet- 'what I eat and when I eat'.  Pt reports feeling 'so much better'.   Review of Systems For ROS see HPI   This visit occurred during the SARS-CoV-2 public health emergency.  Safety protocols were in place, including screening questions prior to the visit, additional usage of staff PPE, and extensive cleaning of exam room while observing appropriate contact time as indicated for disinfecting solutions.       Objective:   Physical Exam Vitals reviewed.  Constitutional:      General: She is not in acute distress.    Appearance: Normal appearance. She is obese. She is not ill-appearing.  HENT:     Head: Normocephalic and atraumatic.  Musculoskeletal:     Right lower leg: Edema present.     Left lower leg: Edema present.  Skin:    Findings: Erythema (deep reddish/purple skin changes of bilateral lower extremities w/ central ulceration and oozing) present.  Neurological:     General: No focal deficit present.     Mental Status: She is alert and oriented to person, place, and time.  Psychiatric:        Mood and Affect: Mood normal.        Behavior: Behavior normal.        Thought Content: Thought content normal.           Assessment & Plan:

## 2020-05-20 ENCOUNTER — Other Ambulatory Visit: Payer: Self-pay | Admitting: Family Medicine

## 2020-06-01 ENCOUNTER — Encounter: Payer: Self-pay | Admitting: Family Medicine

## 2020-06-01 ENCOUNTER — Other Ambulatory Visit: Payer: Self-pay | Admitting: Family Medicine

## 2020-06-01 NOTE — Telephone Encounter (Signed)
Clonazepam last rx 02/05/20 #30 3 RF LOV: 05/19/20 Cellulitus NOV: 08/19/20 CSC: 2013

## 2020-06-03 ENCOUNTER — Other Ambulatory Visit: Payer: Self-pay | Admitting: Emergency Medicine

## 2020-06-03 DIAGNOSIS — I890 Lymphedema, not elsewhere classified: Secondary | ICD-10-CM

## 2020-06-03 MED ORDER — AMOXICILLIN 875 MG PO TABS: 875.0000 mg | ORAL_TABLET | Freq: Two times a day (BID) | ORAL | 0 refills | Status: DC

## 2020-06-03 MED ORDER — FUROSEMIDE 80 MG PO TABS: ORAL_TABLET | ORAL | 0 refills | Status: DC

## 2020-06-03 NOTE — Telephone Encounter (Signed)
I have scheduled patient for 1/20.    Please advise, if patient does not need appt.

## 2020-06-04 ENCOUNTER — Telehealth: Payer: Medicare Other | Admitting: Family Medicine

## 2020-06-10 ENCOUNTER — Other Ambulatory Visit: Payer: Self-pay | Admitting: Nurse Practitioner

## 2020-06-10 DIAGNOSIS — K760 Fatty (change of) liver, not elsewhere classified: Secondary | ICD-10-CM | POA: Diagnosis not present

## 2020-06-11 ENCOUNTER — Ambulatory Visit: Payer: Medicare Other

## 2020-06-11 ENCOUNTER — Other Ambulatory Visit: Payer: Self-pay | Admitting: Family Medicine

## 2020-06-11 DIAGNOSIS — L409 Psoriasis, unspecified: Principal | ICD-10-CM

## 2020-06-11 DIAGNOSIS — K219 Gastro-esophageal reflux disease without esophagitis: Secondary | ICD-10-CM

## 2020-06-11 DIAGNOSIS — I10 Essential (primary) hypertension: Secondary | ICD-10-CM

## 2020-06-11 DIAGNOSIS — E785 Hyperlipidemia, unspecified: Secondary | ICD-10-CM

## 2020-06-11 NOTE — Patient Instructions (Signed)
Ms. Krouse,  Thank you for taking the time to review your medications with me today.  I have included our care plan/goals in the following pages. Please review and call me at [phone] with any questions!  Thanks! Ellin Mayhew, Pharm.D., BCGP Clinical Pharmacist [facility] [phone]  Goals Addressed            This Visit's Progress   . PharmD Care Plan   On track    CARE PLAN ENTRY (see longitudinal plan of care for additional care plan information)  Current Barriers:  . Chronic Disease Management support, education, and care coordination needs related to Hypertension, Hyperlipidemia, and GERD   Hypertension BP Readings from Last 3 Encounters:  05/19/20 130/80  12/11/19 126/79  10/03/19 (!) 143/98   . Pharmacist Clinical Goal(s): o Over the next 180 days, patient will work with PharmD and providers to maintain BP goal <130/80 . Current regimen:  o Irbesartan 75 mg tablet - half tablet (37.5 mg) once daily o Bystolic 10 mg once daily . Interventions: o Diet / exercise recommendations - Maintain a healthy weight and exercise regularly, as directed by your health care provider. Eat healthy foods, such as: Lean proteins, complex carbohydrates, fresh fruits and vegetables, low-fat dairy products, healthy fats. . Patient self care activities - Over the next 180 days, patient will: o Check BP at least once every 1-2 weeks, document, and provide at future appointments o Ensure daily salt intake < 2300 mg/day  Hyperlipidemia Lab Results  Component Value Date/Time   LDLCALC 59 07/26/2019 04:28 PM   LDLDIRECT 76.0 11/24/2015 11:48 AM   . Pharmacist Clinical Goal(s): o Over the next 180 days, patient will work with PharmD and providers to maintain LDL goal < 70 . Current regimen:  o Rosuvastatin 20 mg once daily . Interventions: o Diet / exercise recommendation . Patient self care activities - Over the next 180 days, patient  will: o Continue current management  GERD . Pharmacist Clinical Goal(s) o Over the next 180 days, patient will work with PharmD and providers to minimize acid reflux symptoms and ensure medication safety . Current regimen:  o Pantoprazole 40 mg twice daily . Interventions: o Reviewed reflux triggers . Patient self care activities - Over the next 180 days, patient will: o Continue current management  Medication management . Pharmacist Clinical Goal(s): o Over the next 180 days, patient will work with PharmD and providers to maintain optimal medication adherence . Current pharmacy: [facility] . Interventions o Comprehensive medication review performed. o Continue current medication management strategy . Patient self care activities - Over the next 180 days, patient will: o Focus on medication adherence by Continue current management o Take medications as prescribed o Report any questions or concerns to PharmD and/or provider(s) Initial goal documentation.      The patient verbalized understanding of instructions provided today and agreed to receive a  High Cholesterol  High cholesterol is a condition in which the blood has high levels of a white, waxy substance similar to fat (cholesterol). The liver makes all the cholesterol that the body needs. The human body needs small amounts of cholesterol to help build cells. A person gets extra or excess cholesterol from the food that he or she eats. The blood carries cholesterol from the liver to the rest of the body. If you have high cholesterol, deposits (plaques) may build up on the walls of your arteries. Arteries are  the blood vessels that carry blood away from your heart. These plaques make the arteries narrow and stiff. Cholesterol plaques increase your risk for heart attack and stroke. Work with your health care provider to keep your cholesterol levels in a healthy range. What increases the risk? The following factors may make  you more likely to develop this condition:  Eating foods that are high in animal fat (saturated fat) or cholesterol.  Being overweight.  Not getting enough exercise.  A family history of high cholesterol (familial hypercholesterolemia).  Use of tobacco products.  Having diabetes. What are the signs or symptoms? There are no symptoms of this condition. How is this diagnosed? This condition may be diagnosed based on the results of a blood test.  If you are older than 51 years of age, your health care provider may check your cholesterol levels every 4-6 years.  You may be checked more often if you have high cholesterol or other risk factors for heart disease. The blood test for cholesterol measures:  "Bad" cholesterol, or LDL cholesterol. This is the main type of cholesterol that causes heart disease. The desired level is less than 100 mg/dL.  "Good" cholesterol, or HDL cholesterol. HDL helps protect against heart disease by cleaning the arteries and carrying the LDL to the liver for processing. The desired level for HDL is 60 mg/dL or higher.  Triglycerides. These are fats that your body can store or burn for energy. The desired level is less than 150 mg/dL.  Total cholesterol. This measures the total amount of cholesterol in your blood and includes LDL, HDL, and triglycerides. The desired level is less than 200 mg/dL. How is this treated? This condition may be treated with:  Diet changes. You may be asked to eat foods that have more fiber and less saturated fats or added sugar.  Lifestyle changes. These may include regular exercise, maintaining a healthy weight, and quitting use of tobacco products.  Medicines. These are given when diet and lifestyle changes have not worked. You may be prescribed a statin medicine to help lower your cholesterol levels. Follow these instructions at home: Eating and drinking  Eat a healthy, balanced diet. This diet includes: ? Daily servings of  a variety of fresh, frozen, or canned fruits and vegetables. ? Daily servings of whole grain foods that are rich in fiber. ? Foods that are low in saturated fats and trans fats. These include poultry and fish without skin, lean cuts of meat, and low-fat dairy products. ? A variety of fish, especially oily fish that contain omega-3 fatty acids. Aim to eat fish at least 2 times a week.  Avoid foods and drinks that have added sugar.  Use healthy cooking methods, such as roasting, grilling, broiling, baking, poaching, steaming, and stir-frying. Do not fry your food except for stir-frying.   Lifestyle  Get regular exercise. Aim to exercise for a total of 150 minutes a week. Increase your activity level by doing activities such as gardening, walking, and taking the stairs.  Do not use any products that contain nicotine or tobacco, such as cigarettes, e-cigarettes, and chewing tobacco. If you need help quitting, ask your health care provider.   General instructions  Take over-the-counter and prescription medicines only as told by your health care provider.  Keep all follow-up visits as told by your health care provider. This is important. Where to find more information  American Heart Association: www.heart.org  National Heart, Lung, and Blood Institute: https://wilson-eaton.com/ Contact a health care  provider if:  You have trouble achieving or maintaining a healthy diet or weight.  You are starting an exercise program.  You are unable to stop smoking. Get help right away if:  You have chest pain.  You have trouble breathing.  You have any symptoms of a stroke. "BE FAST" is an easy way to remember the main warning signs of a stroke: ? B - Balance. Signs are dizziness, sudden trouble walking, or loss of balance. ? E - Eyes. Signs are trouble seeing or a sudden change in vision. ? F - Face. Signs are sudden weakness or numbness of the face, or the face or eyelid drooping on one side. ? A -  Arms. Signs are weakness or numbness in an arm. This happens suddenly and usually on one side of the body. ? S - Speech. Signs are sudden trouble speaking, slurred speech, or trouble understanding what people say. ? T - Time. Time to call emergency services. Write down what time symptoms started.  You have other signs of a stroke, such as: ? A sudden, severe headache with no known cause. ? Nausea or vomiting. ? Seizure. These symptoms may represent a serious problem that is an emergency. Do not wait to see if the symptoms will go away. Get medical help right away. Call your local emergency services (911 in the [place] Do not drive yourself to the hospital. Summary  Cholesterol plaques increase your risk for heart attack and stroke. Work with your health care provider to keep your cholesterol levels in a healthy range.  Eat a healthy, balanced diet, get regular exercise, and maintain a healthy weight.  Do not use any products that contain nicotine or tobacco, such as cigarettes, e-cigarettes, and chewing tobacco.  Get help right away if you have any symptoms of a stroke. This information is not intended to replace advice given to you by your health care provider. Make sure you discuss any questions you have with your health care provider. Document Revised: 04/01/2019 Document Reviewed: 04/01/2019 Elsevier Patient Education  2021 [employer].   copy of patient instruction and/or Scientist, clinical (histocompatibility and immunogenetics). Telephone follow up appointment with pharmacy team member scheduled for: See next appointment with "Care Management Staff" under "What's Next" below.

## 2020-06-11 NOTE — Progress Notes (Signed)
Chronic Care Management Pharmacy  Name: [name]  MRN: [id number]  DOB: January 28, 1971  Chief Complaint/ HPI  [name], 50 y.o., female presents for their Follow-Up CCM visit with the clinical pharmacist via telephone due to COVID-19 Pandemic.  PCP : [name], MD  Chronic conditions include:  Encounter Diagnoses  Name Primary?  . Hyperlipidemia, unspecified hyperlipidemia type Yes  . Gastroesophageal reflux disease, unspecified whether esophagitis present   . Essential hypertension     Office Visits:  05/19/2020 (PCP): referral to woundcare (now scheduled 2/7)  03/03/2020(TE): PA for Relpax started 01/30/2020 (Msg): COVID vaccine 3rd dose advised due to compromised immune system  Patient Active Problem List   Diagnosis Date Noted  . Hyponatremia 07/27/2019  . Urinary urgency 04/04/2018  . Chronic narcotic use 09/07/2016  . Acute blood loss anemia 01/14/2016  . Multiple gastric ulcers   . PAN (polyarteritis nodosa) ([place]) 11/24/2015  . GERD (gastroesophageal reflux disease) 01/08/2014  . Routine general medical examination at a health care facility 04/23/2013  . Bariatric surgery status 10/31/2012  . S/P skin biopsy 10/31/2012  . Psoriasis 01/09/2012  . DDD (degenerative disc disease), lumbar 11/30/2011  . Migraine headache   . [name] (obstructive sleep apnea) 11/16/2010  . HYPERGLYCEMIA, FASTING 10/08/2009  . CONTRACTURE OF TENDON 08/17/2009  . PARESTHESIA, HANDS 12/23/2008  . Leg pain, right 11/05/2008  . ADVERSE DRUG REACTION 04/03/2008  . PERIPHERAL EDEMA 03/26/2008  . Morbid obesity ([place]) 01/03/2007  . Cellulitis 01/03/2007  . Hyperlipidemia 09/22/2006  . Fibromyalgia 09/20/2006  . Anxiety and depression 06/28/2006   Past Surgical History:  Procedure Laterality Date  . ABDOMINAL HYSTERECTOMY  10/1996  . ABDOMINAL SURGERY  jan/11/2012   gastric bypass surgery  . CARPAL TUNNEL RELEASE     bilateral   . CESAREAN SECTION    . CHOLECYSTECTOMY  07/1992   . ESOPHAGOGASTRODUODENOSCOPY N/A 01/14/2016   Procedure: ESOPHAGOGASTRODUODENOSCOPY (EGD);  Surgeon: Mauri Pole, MD;  Location: [facility] ENDOSCOPY;  Service: Endoscopy;  Laterality: N/A;   Family History  Problem Relation Age of Onset  . Diabetes Mother   . Emphysema Mother   . Allergies Mother   . Hypertension Mother   . Heart disease Father   . Allergies Father   . Prostate cancer Father   . Breast cancer Maternal Grandmother 31   Allergies  Allergen Reactions  . Niacin Anaphylaxis, Swelling and Other (See Comments)    REACTION: Swelling, problems breathing  . Sulfamethoxazole-Trimethoprim Anaphylaxis, Swelling, Rash and Other (See Comments)    REACTION: Rash, throat closed, eyes swelled.  . Aspirin Hives  . Bactrim Hives  . Benzoin Compound Rash  . Cephalexin Hives    Tolerated ceftriaxone and cefepime 07/2019  . Clindamycin/Lincomycin Hives  . Doxycycline Hives  . Iohexol Hives    Pt treated with PO benedryl  . Lisinopril Hives  . Naproxen Hives  . Pnu-Imune [Pneumococcal Polysaccharide Vaccine] Rash  . Sulfonamide Derivatives Rash   Outpatient Encounter Medications as of 06/11/2020  Medication Sig  . amoxicillin (AMOXIL) 875 MG tablet Take 1 tablet (875 mg total) by mouth 2 (two) times daily.  . ARIPiprazole (ABILIFY) 10 MG tablet Take 10 mg by mouth in the morning.   . Armodafinil 250 MG tablet 3 (three) times daily. 8am, 2pm, 6pm  . Biotin 10 MG TABS Take 10 mg by mouth in the morning.   Marland Kitchen buPROPion (WELLBUTRIN XL) 300 MG 24 hr tablet TAKE 1 TABLET BY MOUTH EVERY DAY  . busPIRone (BUSPAR)  30 MG tablet TAKE 1 TABLET BY MOUTH 2 TIMES DAILY  . calcium citrate (CALCITRATE - DOSED IN MG ELEMENTAL CALCIUM) 950 MG tablet Take 1 tablet by mouth daily.  . ciprofloxacin (CIPRO) 500 MG tablet Take 1 tablet (500 mg total) by mouth 2 (two) times daily.  . clobetasol cream (TEMOVATE) 4.40 % Apply 1 application topically 2 (two) times daily. Cover both legs with Colbetasol cream   . clonazePAM (KLONOPIN) 1 MG tablet Take 1 tablet (1 mg total) by mouth at bedtime.  . collagenase (SANTYL) ointment Apply topically daily. apply Santyl to scabs on legs  . cyclobenzaprine (FLEXERIL) 10 MG tablet TAKE 1 TABLET BY MOUTH 3 TIMES DAILY AS NEEDED  . DULoxetine (CYMBALTA) 60 MG capsule Take 2 capsules (120 mg total) by mouth daily.  . FEROSUL 325 (65 Fe) MG tablet TAKE 1 TABLET BY MOUTH EVERY DAY WITH BREAKFAST  . fluconazole (DIFLUCAN) 100 MG tablet Take 1 tablet (100 mg total) by mouth daily.  . furosemide (LASIX) 80 MG tablet TAKE 1 TABLET BY MOUTH DAILY. can TAKE 1 extra TABLET IF weight INCREASE by 5lb AND call your doctor FOR further instruction  . gabapentin (NEURONTIN) 600 MG tablet TAKE 2 TABLETS BY MOUTH 3 TIMES DAILY  . HYDROcodone-acetaminophen (NORCO) 10-325 MG tablet Take 1 tablet by mouth every 6 (six) hours as needed.  . irbesartan (AVAPRO) 75 MG tablet TAKE 1 TABLET BY MOUTH EVERY DAY  . meclizine (ANTIVERT) 25 MG tablet Take 1 tablet (25 mg total) by mouth 3 (three) times daily as needed for dizziness.  . mupirocin ointment (BACTROBAN) 2 % APPLY TOPICALLY 2 TIMES DAILY  . nebivolol (BYSTOLIC) 10 MG tablet TAKE 1 TABLET BY MOUTH EVERY DAY  . nystatin (MYCOSTATIN/NYSTOP) powder Apply 1 application topically 3 (three) times daily.  . Oxcarbazepine (TRILEPTAL) 300 MG tablet Take 300 mg by mouth 2 (two) times daily.  . pantoprazole (PROTONIX) 40 MG tablet TAKE 1 TABLET BY MOUTH 2 TIMES DAILY  . pentoxifylline (TRENTAL) 400 MG CR tablet Take 400 mg by mouth 3 (three) times daily with meals.   . promethazine (PHENERGAN) 25 MG tablet TAKE 1 TABLET BY MOUTH EVERY 6 HOURS AS NEEDED FOR NAUSEA AND VOMITING  . rosuvastatin (CRESTOR) 20 MG tablet TAKE 1 TABLET BY MOUTH EVERY DAY  . senna-docusate (SENOKOT-S) 8.6-50 MG tablet Take 1 tablet by mouth at bedtime. (Patient not taking: Reported on 03/17/2020)  . SKYRIZI, 150 MG DOSE, 75 MG/0.83ML PSKT Inject 150 mg into the skin every  3 (three) months.   . SUMAtriptan (IMITREX) 100 MG tablet Take 1 tablet (100 mg total) by mouth every 2 (two) hours as needed for migraine. May repeat in 2 hours if headache persists or recurs.   No facility-administered encounter medications on file as of 06/11/2020.   Patient Care Team    Relationship Specialty Notifications Start End  [name], MD PCP - General   04/21/10   Eino Farber, PA-C Consulting Physician Physician Assistant  11/24/15    Comment: [facility]  Blima Dessert, MD Referring Physician Rheumatology  11/24/15   Loa Socks, MD Referring Physician Dermatology  12/21/16   Terrance Mass, MD (Inactive)  Gynecology  12/21/16   Anastasio Auerbach, MD (Inactive) Consulting Physician Gynecology  06/13/18   Madelin Rear, [facility] Pharmacist Pharmacist  02/03/20    Comment: [phone]   Current Diagnosis/Assessment: Goals Addressed            This Visit's Progress   . PharmD  Care Plan   On track    CARE PLAN ENTRY (see longitudinal plan of care for additional care plan information)  Current Barriers:  . Chronic Disease Management support, education, and care coordination needs related to Hypertension, Hyperlipidemia, and GERD   Hypertension BP Readings from Last 3 Encounters:  05/19/20 130/80  12/11/19 126/79  10/03/19 (!) 143/98   . Pharmacist Clinical Goal(s): o Over the next 180 days, patient will work with PharmD and providers to maintain BP goal <130/80 . Current regimen:  o Irbesartan 75 mg tablet - half tablet (37.5 mg) once daily o Bystolic 10 mg once daily . Interventions: o Diet / exercise recommendations - Maintain a healthy weight and exercise regularly, as directed by your health care provider. Eat healthy foods, such as: Lean proteins, complex carbohydrates, fresh fruits and vegetables, low-fat dairy products, healthy fats. . Patient self care activities - Over the next 180 days, patient will: o Check BP at least once every 1-2 weeks,  document, and provide at future appointments o Ensure daily salt intake < 2300 mg/day  Hyperlipidemia Lab Results  Component Value Date/Time   LDLCALC 59 07/26/2019 04:28 PM   LDLDIRECT 76.0 11/24/2015 11:48 AM   . Pharmacist Clinical Goal(s): o Over the next 180 days, patient will work with PharmD and providers to maintain LDL goal < 70 . Current regimen:  o Rosuvastatin 20 mg once daily . Interventions: o Diet / exercise recommendation . Patient self care activities - Over the next 180 days, patient will: o Continue current management  GERD . Pharmacist Clinical Goal(s) o Over the next 180 days, patient will work with PharmD and providers to minimize acid reflux symptoms and ensure medication safety . Current regimen:  o Pantoprazole 40 mg twice daily . Interventions: o Reviewed reflux triggers . Patient self care activities - Over the next 180 days, patient will: o Continue current management  Medication management . Pharmacist Clinical Goal(s): o Over the next 180 days, patient will work with PharmD and providers to maintain optimal medication adherence . Current pharmacy: [facility] . Interventions o Comprehensive medication review performed. o Continue current medication management strategy . Patient self care activities - Over the next 180 days, patient will: o Focus on medication adherence by Continue current management o Take medications as prescribed o Report any questions or concerns to PharmD and/or provider(s) Initial goal documentation.      Hypertension   BP goal <130/80  BP Readings from Last 3 Encounters:  05/19/20 130/80  12/11/19 126/79  10/03/19 (!) 143/98   BMP Latest Ref Rng & Units 09/06/2019 08/14/2019 08/02/2019  Glucose 70 - 99 mg/dL 93 83 93  BUN 6 - 23 mg/dL 8 12 22(H)  Creatinine 0.40 - 1.20 mg/dL 0.59 0.65 0.69  Sodium 135 - 145 mEq/L 130(L) 135 136  Potassium 3.5 - 5.1 mEq/L 3.7 4.2 3.5  Chloride 96 - 112 mEq/L 89(L) 96  93(L)  CO2 19 - 32 mEq/L 33(H) 32 28  Calcium 8.4 - 10.5 mg/dL 8.4 9.1 9.4  Patient checks BP at home daily. Patient home BP readings are ranging: readings have been normal per pt, no specific readings provided. Trying best to ambulate/stay active Feels diet is doing better, no particular diet followed.   Denies dizziness or any complaints with current medications. Patient is currently at goal on the following medications:  . Bystolic 10 mg once daily in morning  . Irbesartan 75 mg once daily - half tablet daily as needed  We discussed diet and exercise extensively - Maintain a healthy weight and exercise regularly, as directed by your health care provider. Eat healthy foods, such as: Lean proteins, complex carbohydrates, fresh fruits and vegetables, low-fat dairy products, healthy fats.  Plan  Continue current medications and control with diet and exercise.   Hyperlipidemia   LDL goal < 70  Lipid Panel     Component Value Date/Time   CHOL 107 07/26/2019 1628   TRIG 47 07/26/2019 1628   HDL 39 (L) 07/26/2019 1628   LDLCALC 59 07/26/2019 1628   LDLDIRECT 76.0 11/24/2015 1148    Hepatic Function Latest Ref Rng & Units 07/26/2019 04/15/2019 05/11/2018  Total Protein 6.5 - 8.1 g/dL 6.4(L) - 6.8  Albumin 3.5 - 5.0 g/dL 2.8(L) - 3.9  AST 15 - 41 U/L '22 14 18  ' ALT 0 - 44 U/L '15 12 14  ' Alk Phosphatase 38 - 126 U/L 113 132(A) 144(H)  Total Bilirubin 0.3 - 1.2 mg/dL 0.4 - 0.3  Bilirubin, Direct 0.0 - 0.3 mg/dL - - 0.1    The ASCVD Risk score Mikey Bussing DC Jr., et al., 2013) failed to calculate for the following reasons:   The valid total cholesterol range is 130 to 320 mg/dL   Patient has failed these meds in past: n/a Patient is currently at goal on the following medications:  . Rosuvastatin 20 mg once daily  Reviewed side effects - no problems noted.  Plan  Continue current medications and control with diet and exercise.  GERD   Lab Results  Component Value Date   VITAMINB12  1,100 (H) 11/06/2013   Denies any recent acid reflux. Currently controlled on: . Pantoprazole 40 mg twice daily   Plan   Continue current medication.  Consider b12 lab due to long term high risk medication use.   Vaccines   Immunization History  Administered Date(s) Administered  . Influenza Split 03/17/2011  . Influenza Whole 02/15/2010  . Influenza,inj,Quad PF,6+ Mos 05/17/2015, 02/23/2016, 02/13/2017, 01/18/2019  . Influenza-Unspecified 02/27/2013, 01/11/2018, 03/10/2020  . PFIZER(Purple Top)SARS-COV-2 Vaccination 08/15/2019, 09/09/2019, 01/31/2020  . Pneumococcal Conjugate-13 10/04/2017  . Pneumococcal Polysaccharide-23 03/17/2011, 01/10/2018  . Td 05/16/2002  . Tdap 07/24/2018   Reviewed and discussed patient's vaccination history.    Plan  Recommended patient receive flu vaccine at office visit next week while here for PCP visit.   Medication Management / Care Coordination   Receives prescription medications from:  [place], [place] - [address] [place] [address] Phone: [phone] Fax: [phone]   Denies any issues with current medication management.   Plan  Continue current medication management strategy. ___________________________ SDOH (Social Determinants of Health) assessments performed: Yes.  Future Appointments  Date Time Provider [place]  06/22/2020 10:30 AM Ricard Dillon, MD [facility]  08/19/2020  1:00 PM [name], MD [facility]  09/04/2020  8:00 AM [name], MD [facility]  11/13/2020 10:00 AM [facility] PHARMACIST [facility]   Visit follow-up:  . CPA follow-up: n/a. Marland Kitchen RPH follow-up: 5-6 month telephone visit.  Madelin Rear, Pharm.D., BCGP Clinical Pharmacist [facility] [phone]

## 2020-06-12 ENCOUNTER — Encounter: Payer: Self-pay | Admitting: Family Medicine

## 2020-06-12 NOTE — Unmapped (Signed)
[facility] Refill Coordination Note    Specialty Medication(s) to be Shipped:   Inflammatory Disorders: Skyrizi    Other medication(s) to be shipped: No additional medications requested for fill at this time     Autumn Mcdonald, DOB: 11-10-1970  Phone: [phone] (home)       All above HIPAA information was verified with patient.     Was a Nurse, learning disability used for this call? No    Completed refill call assessment today to schedule patient's medication shipment from the [facility] [phone]).       Specialty medication(s) and dose(s) confirmed: Regimen is correct and unchanged.   Changes to medications: Sashay reports no changes at this time.  Changes to insurance: No  Questions for the pharmacist: No    Confirmed patient received Welcome Packet with first shipment. The patient will receive a drug information handout for each medication shipped and additional FDA Medication Guides as required.       DISEASE/MEDICATION-SPECIFIC INFORMATION        For patients on injectable medications: Patient currently has 0 doses left.  Next injection is scheduled for 06/15/2020.    SPECIALTY MEDICATION ADHERENCE     Medication Adherence    Patient reported X missed doses in the last month: 0  Specialty Medication: Skyrizi 150 mg/ml  Patient is on additional specialty medications: No         SHIPPING     Shipping address confirmed in Epic.     Delivery Scheduled: Yes, Expected medication delivery date: 06/16/2020. due to PA pending     Medication will be delivered via UPS to the prescription address in Epic WAM.    Oretha Milch   [facility] Specialty Technician

## 2020-06-14 ENCOUNTER — Other Ambulatory Visit: Payer: Self-pay | Admitting: Family Medicine

## 2020-06-15 ENCOUNTER — Other Ambulatory Visit: Payer: Self-pay | Admitting: Family Medicine

## 2020-06-15 MED ORDER — HYDROCODONE-ACETAMINOPHEN 10-325 MG PO TABS: 1.0000 | ORAL_TABLET | Freq: Four times a day (QID) | ORAL | 0 refills | Status: DC | PRN

## 2020-06-15 MED FILL — SKYRIZI 150 MG/ML SUBCUTANEOUS SYRINGE: 84 days supply | Qty: 1 | Fill #1

## 2020-06-15 NOTE — Telephone Encounter (Signed)
Duplicate request

## 2020-06-15 NOTE — Telephone Encounter (Signed)
Requesting:Hydrocodone Contract: UDS: Last Visit: Next Visit: Last Refill: Duplicate request Please Advise

## 2020-06-15 NOTE — Telephone Encounter (Signed)
Requesting:Norco Contract: UDS: Last Visit:05/19/20 Next Visit:09/04/20 Last Refill:05/18/20 90 tabs 0 refills Please Advise

## 2020-06-16 ENCOUNTER — Encounter (HOSPITAL_BASED_OUTPATIENT_CLINIC_OR_DEPARTMENT_OTHER): Payer: Medicare Other | Attending: Internal Medicine | Admitting: Internal Medicine

## 2020-06-16 ENCOUNTER — Other Ambulatory Visit: Payer: Self-pay

## 2020-06-16 DIAGNOSIS — I890 Lymphedema, not elsewhere classified: Secondary | ICD-10-CM | POA: Diagnosis not present

## 2020-06-16 DIAGNOSIS — L97811 Non-pressure chronic ulcer of other part of right lower leg limited to breakdown of skin: Secondary | ICD-10-CM | POA: Diagnosis not present

## 2020-06-16 DIAGNOSIS — L97812 Non-pressure chronic ulcer of other part of right lower leg with fat layer exposed: Secondary | ICD-10-CM | POA: Diagnosis not present

## 2020-06-16 DIAGNOSIS — I87333 Chronic venous hypertension (idiopathic) with ulcer and inflammation of bilateral lower extremity: Secondary | ICD-10-CM | POA: Insufficient documentation

## 2020-06-16 DIAGNOSIS — M300 Polyarteritis nodosa: Secondary | ICD-10-CM | POA: Insufficient documentation

## 2020-06-16 DIAGNOSIS — L97821 Non-pressure chronic ulcer of other part of left lower leg limited to breakdown of skin: Secondary | ICD-10-CM | POA: Diagnosis not present

## 2020-06-16 DIAGNOSIS — L97822 Non-pressure chronic ulcer of other part of left lower leg with fat layer exposed: Secondary | ICD-10-CM | POA: Diagnosis not present

## 2020-06-16 DIAGNOSIS — L97212 Non-pressure chronic ulcer of right calf with fat layer exposed: Secondary | ICD-10-CM | POA: Diagnosis not present

## 2020-06-16 MED ORDER — ALPRAZOLAM 0.5 MG PO TABS: 0.5000 mg | ORAL_TABLET | Freq: Two times a day (BID) | ORAL | 0 refills | Status: DC | PRN

## 2020-06-16 MED ORDER — ALPRAZOLAM 0.5 MG TABLET
ORAL | 0.00000 days
Start: 2020-06-16 — End: ?

## 2020-06-16 NOTE — Progress Notes (Signed)
WENDI, LASTRA ([id number]) Visit Report for 06/16/2020 Abuse/Suicide Risk Screen Details Patient Name: Date of Service: HA RASHEEMA, TRULUCK 06/16/2020 1:15 PM Medical Record Number: [id number] Patient Account Number: [id number] Date of Birth/Sex: Treating RN: 08-13-1970 (50 y.o. [name] Primary Care Antwann Preziosi: [name] Other Clinician: Referring Aidel Davisson: Treating Loreal Schuessler/Extender: [name] in Treatment: 0 Abuse/Suicide Risk Screen Items Answer ABUSE RISK SCREEN: Has anyone close to you tried to hurt or harm you recentlyo No Do you feel uncomfortable with anyone in your familyo No Has anyone forced you do things that you didnt want to doo No Electronic Signature(s) Signed: 06/16/2020 5:18:27 PM By: Levan Hurst RN, BSN Entered By: Levan Hurst on 06/16/2020 13:24:07 -------------------------------------------------------------------------------- Activities of Daily Living Details Patient Name: Date of Service: MADAI, NUCCIO 06/16/2020 1:15 PM Medical Record Number: [id number] Patient Account Number: [id number] Date of Birth/Sex: Treating RN: [birth year] (50 y.o. [name] Primary Care Marv Alfrey: [name] Other Clinician: Referring Raymir Frommelt: Treating Chinara Hertzberg/Extender: [name] in Treatment: 0 Activities of Daily Living Items Answer Activities of Daily Living (Please select one for each item) Drive Automobile Completely Able T Medications ake Completely Able Use T elephone Completely Able Care for Appearance Completely Able Use T oilet Completely Able Bath / Shower Completely Able Dress Self Completely Able Feed Self Completely Able Walk Completely Able Get In / Out Bed Completely Able Housework Completely Able Prepare Meals Completely [place] for Self Completely Able Electronic Signature(s) Signed: 06/16/2020 5:18:27 PM By: Levan Hurst RN,  BSN Entered By: Levan Hurst on 06/16/2020 13:24:25 -------------------------------------------------------------------------------- Education Screening Details Patient Name: Date of Service: [name] 06/16/2020 1:15 PM Medical Record Number: [id number] Patient Account Number: [id number] Date of Birth/Sex: Treating RN: Apr 20, 1971 (50 y.o. [name] Primary Care Jamieka Royle: [name] Other Clinician: Referring Axil Copeman: Treating Felisha Claytor/Extender: [name] in Treatment: 0 Primary Learner Assessed: Patient Learning Preferences/Education Level/Primary Language Learning Preference: Explanation, Demonstration, Printed Material Highest Education Level: High School Preferred Language: English Cognitive Barrier Language Barrier: No Translator Needed: No Memory Deficit: No Emotional Barrier: No Cultural/Religious Beliefs Affecting Medical Care: No Physical Barrier Impaired Vision: No Impaired Hearing: No Decreased Hand dexterity: No Knowledge/Comprehension Knowledge Level: High Comprehension Level: High Ability to understand written instructions: High Ability to understand verbal instructions: High Motivation Anxiety Level: Calm Cooperation: Cooperative Education Importance: Acknowledges Need Interest in Health Problems: Asks Questions Perception: Coherent Willingness to Engage in Self-Management High Activities: Readiness to Engage in Self-Management High Activities: Electronic Signature(s) Signed: 06/16/2020 5:18:27 PM By: Levan Hurst RN, BSN Entered By: Levan Hurst on 06/16/2020 13:24:42 -------------------------------------------------------------------------------- Fall Risk Assessment Details Patient Name: Date of Service: HA [name], Helmut Muster. 06/16/2020 1:15 PM Medical Record Number: [id number] Patient Account Number: [id number] Date of Birth/Sex: Treating RN: July 24, 1970 (50 y.o. [name] Primary Care  Avrey Hyser: [name] Other Clinician: Referring Giovanni Bath: Treating Corrina Steffensen/Extender: [name] in Treatment: 0 Fall Risk Assessment Items Have you had 2 or more falls in the last 12 monthso 0 No Have you had any fall that resulted in injury in the last 12 monthso 0 No FALLS RISK SCREEN History of falling - immediate or within 3 months 0 No Secondary diagnosis (Do you have 2 or more medical diagnoseso) 15 Yes Ambulatory aid None/bed rest/wheelchair/nurse 0 Yes Crutches/cane/walker 0 No Furniture 0 No Intravenous therapy Access/Saline/Heparin Lock 0 No Gait/Transferring Normal/ bed rest/ wheelchair 0 Yes Weak (short steps with  or without shuffle, stooped but able to lift head while walking, may seek 0 No support from furniture) Impaired (short steps with shuffle, may have difficulty arising from chair, head down, impaired 0 No balance) Mental Status Oriented to own ability 0 Yes Electronic Signature(s) Signed: 06/16/2020 5:18:27 PM By: Levan Hurst RN, BSN Entered By: Levan Hurst on 06/16/2020 13:24:55 -------------------------------------------------------------------------------- Foot Assessment Details Patient Name: Date of Service: [name]. 06/16/2020 1:15 PM Medical Record Number: [id number] Patient Account Number: [id number] Date of Birth/Sex: Treating RN: 1970-06-20 (50 y.o. [name] Primary Care Anand Tejada: [name] Other Clinician: Referring Masaki Rothbauer: Treating Yolinda Duerr/Extender: [name] in Treatment: 0 Foot Assessment Items Site Locations + = Sensation present, - = Sensation absent, C = Callus, U = Ulcer R = Redness, W = Warmth, M = Maceration, PU = Pre-ulcerative lesion F = Fissure, S = Swelling, D = Dryness Assessment Right: Left: Other Deformity: No No Prior Foot Ulcer: No No Prior Amputation: No No Charcot Joint: No No Ambulatory Status: Ambulatory Without  Help Gait: Steady Electronic Signature(s) Signed: 06/16/2020 5:18:27 PM By: Levan Hurst RN, BSN Entered By: Levan Hurst on 06/16/2020 13:38:09 -------------------------------------------------------------------------------- Nutrition Risk Screening Details Patient Name: Date of Service: [name]. 06/16/2020 1:15 PM Medical Record Number: [id number] Patient Account Number: [id number] Date of Birth/Sex: Treating RN: 04/14/1971 (50 y.o. [name] Primary Care Vernelle Wisner: [name] Other Clinician: Referring Doyle Kunath: Treating Vida Nicol/Extender: [name] in Treatment: 0 Height (in): 62 Weight (lbs): 304 Body Mass Index (BMI): 55.6 Nutrition Risk Screening Items Score Screening NUTRITION RISK SCREEN: I have an illness or condition that made me change the kind and/or amount of food I eat 0 No I eat fewer than two meals per day 0 No I eat few fruits and vegetables, or milk products 0 No I have three or more drinks of beer, liquor or wine almost every day 0 No I have tooth or mouth problems that make it hard for me to eat 0 No I don't always have enough money to buy the food I need 0 No I eat alone most of the time 0 No I take three or more different prescribed or over-the-counter drugs a day 1 Yes Without wanting to, I have lost or gained 10 pounds in the last six months 0 No I am not always physically able to shop, cook and/or feed myself 2 Yes Nutrition Protocols Good Risk Protocol Moderate Risk Protocol 0 Provide education on nutrition High Risk Proctocol Risk Level: Moderate Risk Score: 3 Electronic Signature(s) Signed: 06/16/2020 5:18:27 PM By: Levan Hurst RN, BSN Entered By: Levan Hurst on 06/16/2020 13:25:03

## 2020-06-17 ENCOUNTER — Encounter (HOSPITAL_BASED_OUTPATIENT_CLINIC_OR_DEPARTMENT_OTHER): Payer: Medicare Other | Admitting: Physician Assistant

## 2020-06-22 ENCOUNTER — Encounter (HOSPITAL_BASED_OUTPATIENT_CLINIC_OR_DEPARTMENT_OTHER): Payer: Medicare Other | Admitting: Internal Medicine

## 2020-06-22 NOTE — Progress Notes (Signed)
[name], [name] ([id number]) Visit Report for 06/16/2020 Allergy List Details Patient Name: Date of Service: [name], [name] 06/16/2020 1:15 PM Medical Record Number: [id number] Patient Account Number: [id number] Date of Birth/Sex: Treating RN: 1970-12-31 (50 y.o. [name] Primary Care Shonte Soderlund: [name] Other Clinician: Referring Abem Shaddix: Treating Raimundo Corbit/Extender: [name] in Treatment: 0 Allergies Active Allergies niacin Reaction: anaphylaxis Severity: Severe Type: Food sulfamethoxazole Reaction: anaphylaxis Severity: Severe Type: Food aspirin Reaction: hives Severity: Moderate Type: Food Bactrim Reaction: hives Severity: Moderate Type: Medication benzoin Reaction: rash Severity: Moderate Type: Food cephalexin Reaction: hives (has tolerated ceftriaxone and cefepime) Severity: Moderate Type: Food clindamycin Reaction: hives Severity: Moderate Type: Food doxycycline Reaction: hives Severity: Moderate Type: Food iohexol Reaction: hives Severity: Moderate Type: Food lisinopril Reaction: hives Severity: Moderate Type: Food naproxen Reaction: hives Severity: Moderate Type: Food Sulfa (Sulfonamide Antibiotics) Reaction: rash Severity: Moderate Type: Allergen Allergy Notes Electronic Signature(s) Signed: 06/16/2020 5:18:27 PM By: Levan Hurst RN, BSN Entered By: Levan Hurst on 06/16/2020 13:28:43 -------------------------------------------------------------------------------- Arrival Information Details Patient Name: Date of Service: Erica Devoid K. 06/16/2020 1:15 PM Medical Record Number: [id number] Patient Account Number: [id number] Date of Birth/Sex: Treating RN: 17-Feb-1971 (50 y.o. [name] Primary Care Shylin Keizer: [name] Other Clinician: Referring Saunders [place]: Treating Jerrika Ledlow/Extender: [name] in Treatment: 0 Visit Information Patient Arrived:  Ambulatory Arrival Time: 13:01 Accompanied By: alone Transfer Assistance: None Patient Identification Verified: Yes Secondary Verification Process Completed: Yes Patient Requires Transmission-Based Precautions: No Patient Has Alerts: No Electronic Signature(s) Signed: 06/16/2020 5:18:27 PM By: Levan Hurst RN, BSN Entered By: Levan Hurst on 06/16/2020 13:01:42 -------------------------------------------------------------------------------- Clinic Level of Care Assessment Details Patient Name: Date of Service: Erica JEZIAH, BLACKBEAR 06/16/2020 1:15 PM Medical Record Number: [id number] Patient Account Number: [id number] Date of Birth/Sex: Treating RN: 10/28/1970 (50 y.o. [name], [name] Primary Care Kyden Potash: [name] Other Clinician: Referring Oliviagrace Crisanti: Treating Kanai Hilger/Extender: [name] in Treatment: 0 Clinic Level of Care Assessment Items TOOL 1 Quantity Score X- 1 0 Use when EandM and Procedure is performed on INITIAL visit ASSESSMENTS - Nursing Assessment / Reassessment X- 1 20 General Physical Exam (combine w/ comprehensive assessment (listed just below) when performed on new pt. evals) X- 1 25 Comprehensive Assessment (HX, ROS, Risk Assessments, Wounds Hx, etc.) ASSESSMENTS - Wound and Skin Assessment / Reassessment X- 1 10 Dermatologic / Skin Assessment (not related to wound area) ASSESSMENTS - Ostomy and/or Continence Assessment and Care []  - 0 Incontinence Assessment and Management []  - 0 Ostomy Care Assessment and Management (repouching, etc.) PROCESS - Coordination of Care []  - 0 Simple Patient / Family Education for ongoing care X- 1 20 Complex (extensive) Patient / Family Education for ongoing care X- 1 10 Staff obtains Programmer, systems, Records, T Results / Process Orders est []  - 0 Staff telephones HHA, Nursing Homes / Clarify orders / etc []  - 0 Routine Transfer to another Facility (non-emergent condition) []  -  0 Routine Hospital Admission (non-emergent condition) X- 1 15 New Admissions / Biomedical engineer / Ordering NPWT Apligraf, etc. , []  - 0 Emergency Hospital Admission (emergent condition) PROCESS - Special Needs []  - 0 Pediatric / Minor Patient Management []  - 0 Isolation Patient Management []  - 0 Hearing / Language / Visual special needs []  - 0 Assessment of Community assistance (transportation, D/C planning, etc.) []  - 0 Additional assistance / Altered mentation []  - 0 Support Surface(s) Assessment (bed, cushion, seat, etc.) INTERVENTIONS - Miscellaneous []  - 0  External ear exam []  - 0 Patient Transfer (multiple staff / Civil Service fast streamer / Similar devices) []  - 0 Simple Staple / Suture removal (25 or less) []  - 0 Complex Staple / Suture removal (26 or more) []  - 0 Hypo/Hyperglycemic Management (do not check if billed separately) X- 1 15 Ankle / Brachial Index (ABI) - do not check if billed separately Has the patient been seen at the hospital within the last three years: Yes Total Score: 115 Level Of Care: New/Established - Level 3 Electronic Signature(s) Signed: 06/22/2020 9:09:54 AM By: [name] RN Entered By: [name] on 06/16/2020 14:39:29 -------------------------------------------------------------------------------- Compression Therapy Details Patient Name: Date of Service: Erica Gong. 06/16/2020 1:15 PM Medical Record Number: [id number] Patient Account Number: [id number] Date of Birth/Sex: Treating RN: [birth year] (50 y.o. [name], [name] Primary Care Edan Juday: [name] Other Clinician: Referring Tashyra Adduci: Treating Avital Dancy/Extender: [name] in Treatment: 0 Compression Therapy Performed for Wound Assessment: Wound #1 Left,Anterior Lower Leg Performed By: Clinician [name], RN Compression Type: Three Layer Post Procedure Diagnosis Same as Pre-procedure Electronic Signature(s) Signed:  06/22/2020 9:09:54 AM By: [name] RN Entered By: [name] on 06/16/2020 14:43:12 -------------------------------------------------------------------------------- Compression Therapy Details Patient Name: Date of Service: Erica Gong. 06/16/2020 1:15 PM Medical Record Number: [id number] Patient Account Number: [id number] Date of Birth/Sex: Treating RN: 02-Mar-1971 (50 y.o. [name], [name] Primary Care Sandeep Delagarza: [name] Other Clinician: Referring Kareem Aul: Treating Jarriel Papillion/Extender: [name] in Treatment: 0 Compression Therapy Performed for Wound Assessment: Wound #2 Left,Medial Lower Leg Performed By: Clinician [name], RN Compression Type: Three Layer Post Procedure Diagnosis Same as Pre-procedure Electronic Signature(s) Signed: 06/22/2020 9:09:54 AM By: [name] RN Entered By: [name] on 06/16/2020 14:43:12 -------------------------------------------------------------------------------- Compression Therapy Details Patient Name: Date of Service: Erica Gong. 06/16/2020 1:15 PM Medical Record Number: [id number] Patient Account Number: [id number] Date of Birth/Sex: Treating RN: 1970-08-30 (50 y.o. [name], [name] Primary Care Kendrix Orman: [name] Other Clinician: Referring Mireille Lacombe: Treating Brittanny Levenhagen/Extender: [name] in Treatment: 0 Compression Therapy Performed for Wound Assessment: Wound #3 Left,Proximal,Lateral Lower Leg Performed By: Clinician [name], RN Compression Type: Three Layer Post Procedure Diagnosis Same as Pre-procedure Electronic Signature(s) Signed: 06/22/2020 9:09:54 AM By: [name] RN Entered By: [name] on 06/16/2020 14:43:12 -------------------------------------------------------------------------------- Compression Therapy Details Patient Name: Date of Service: Erica Gong. 06/16/2020  1:15 PM Medical Record Number: [id number] Patient Account Number: [id number] Date of Birth/Sex: Treating RN: April 24, 1971 (50 y.o. [name], [name] Primary Care Keane Martelli: [name] Other Clinician: Referring Senaya Dicenso: Treating Nallely Yost/Extender: [name] in Treatment: 0 Compression Therapy Performed for Wound Assessment: Wound #4 Left,Distal,Lateral Lower Leg Performed By: Clinician [name], RN Compression Type: Three Layer Post Procedure Diagnosis Same as Pre-procedure Electronic Signature(s) Signed: 06/22/2020 9:09:54 AM By: [name] RN Entered By: [name] on 06/16/2020 14:43:12 -------------------------------------------------------------------------------- Compression Therapy Details Patient Name: Date of Service: Erica Gong. 06/16/2020 1:15 PM Medical Record Number: [id number] Patient Account Number: [id number] Date of Birth/Sex: Treating RN: April 27, 1971 (50 y.o. [name], [name] Primary Care Dellamae Rosamilia: [name] Other Clinician: Referring Marilyn Nihiser: Treating Coy Rochford/Extender: [name] in Treatment: 0 Compression Therapy Performed for Wound Assessment: Wound #5 Right,Anterior Lower Leg Performed By: Clinician [name], RN Compression Type: Three Layer Post Procedure Diagnosis Same as Pre-procedure Electronic Signature(s) Signed: 06/22/2020 9:09:54 AM By: [name] RN Entered By: [name] on 06/16/2020 14:43:12 -------------------------------------------------------------------------------- Compression Therapy  Details Patient Name: Date of Service: [name], [name] 06/16/2020 1:15 PM Medical Record Number: [id number] Patient Account Number: [id number] Date of Birth/Sex: Treating RN: Sep 06, 1970 (50 y.o. [name], [name] Primary Care Aslee Such: [name] Other Clinician: Referring Akasia Ahmad: Treating Kamarrion Stfort/Extender: [name] in Treatment: 0 Compression Therapy Performed for Wound Assessment: Wound #6 Right,Medial Lower Leg Performed By: Clinician [name], RN Compression Type: Three Layer Post Procedure Diagnosis Same as Pre-procedure Electronic Signature(s) Signed: 06/22/2020 9:09:54 AM By: [name] RN Entered By: [name] on 06/16/2020 14:43:12 -------------------------------------------------------------------------------- Compression Therapy Details Patient Name: Date of Service: Erica Gong. 06/16/2020 1:15 PM Medical Record Number: [id number] Patient Account Number: [id number] Date of Birth/Sex: Treating RN: 1970/08/07 (50 y.o. [name], [name] Primary Care Eryanna Regal: [name] Other Clinician: Referring Ladaja Yusupov: Treating Matilda Fleig/Extender: [name] in Treatment: 0 Compression Therapy Performed for Wound Assessment: Wound #7 Right,Posterior Lower Leg Performed By: Clinician [name], RN Compression Type: Three Layer Post Procedure Diagnosis Same as Pre-procedure Electronic Signature(s) Signed: 06/22/2020 9:09:54 AM By: [name] RN Entered By: [name] on 06/16/2020 14:43:12 -------------------------------------------------------------------------------- Encounter Discharge Information Details Patient Name: Date of Service: Erica Dennison Mascot. 06/16/2020 1:15 PM Medical Record Number: [id number] Patient Account Number: [id number] Date of Birth/Sex: Treating RN: 1970/11/17 (50 y.o. [name] Primary Care Adetokunbo Mccadden: [name] Other Clinician: Referring Veanna Dower: Treating Isla Sabree/Extender: [name] in Treatment: 0 Encounter Discharge Information Items Discharge Condition: Stable Ambulatory Status: Ambulatory Discharge Destination: Home Transportation: Private Auto Accompanied By: alone Schedule Follow-up Appointment:  Yes Clinical Summary of Care: Patient Declined Electronic Signature(s) Signed: 06/16/2020 5:18:27 PM By: Levan Hurst RN, BSN Entered By: Levan Hurst on 06/16/2020 16:38:11 -------------------------------------------------------------------------------- Lower Extremity Assessment Details Patient Name: Date of Service: Erica Dennison Mascot. 06/16/2020 1:15 PM Medical Record Number: [id number] Patient Account Number: [id number] Date of Birth/Sex: Treating RN: [birth year] (50 y.o. [name] Primary Care Edelyn Heidel: [name] Other Clinician: Referring Lynna Zamorano: Treating Liston Thum/Extender: [name] in Treatment: 0 Edema Assessment Assessed: Shirlyn Goltz: No] Patrice Paradise: No] Edema: [Left: Yes] [Right: Yes] Calf Left: Right: Point of Measurement: 28 cm From Medial Instep 46 cm 46 cm Ankle Left: Right: Point of Measurement: 11 cm From Medial Instep 29 cm 27 cm Knee To Floor Left: Right: From Medial Instep 33 cm 33 cm Vascular Assessment Pulses: Dorsalis Pedis Palpable: [Left:Yes] [Right:Yes] Blood Pressure: Brachial: [Left:114] [Right:114] Ankle: [Left:Dorsalis Pedis: 110 0.96] [Right:Dorsalis Pedis: 120 1.05] Electronic Signature(s) Signed: 06/16/2020 5:18:27 PM By: Levan Hurst RN, BSN Entered By: Levan Hurst on 06/16/2020 13:37:16 -------------------------------------------------------------------------------- Multi Wound Chart Details Patient Name: Date of Service: Erica Devoid K. 06/16/2020 1:15 PM Medical Record Number: [id number] Patient Account Number: [id number] Date of Birth/Sex: Treating RN: [birth year] (50 y.o. [name], [name] Primary Care Loye Reininger: [name] Other Clinician: Referring Alzada Brazee: Treating Aundra Pung/Extender: [name] in Treatment: 0 Vital Signs Height(in): 64 Pulse(bpm): 9 Weight(lbs): 304 Blood Pressure(mmHg): 114/73 Body Mass Index(BMI): 56 Temperature(F):  97.6 Respiratory Rate(breaths/min): 18 Photos: [1:No Photos Left, Anterior Lower Leg] [2:No Photos Left, Medial Lower Leg] [3:No Photos Left, Proximal, Lateral Lower Leg] Wound Location: [1:Gradually Appeared] [2:Gradually Appeared] [3:Gradually Appeared] Wounding Event: [1:Venous Leg Ulcer] [2:Venous Leg Ulcer] [3:Venous Leg Ulcer] Primary Etiology: [1:Lymphedema, Sleep Apnea,] [2:Lymphedema, Sleep Apnea,] [3:Lymphedema, Sleep Apnea,] Comorbid History: [1:Hypertension, Peripheral Venous Disease, Osteoarthritis, Confinement Anxiety 05/18/2020] [2:Hypertension, Peripheral Venous Disease, Osteoarthritis, Confinement Anxiety 05/18/2020] [3:Hypertension, Peripheral Venous Disease,  Osteoarthritis, Confinement Anxiety 05/18/2020] Date  Acquired: [1:0] [2:0] [3:0] Weeks of Treatment: [1:Open] [2:Open] [3:Open] Wound Status: [1:5.8x6.7x0.1] [2:1x1.9x0.1] [3:1.5x2x0.1] Measurements L x W x D (cm) [1:30.521] [2:1.492] [3:2.356] A (cm) : rea [1:3.052] [2:0.149] [3:0.236] Volume (cm) : [1:Full Thickness Without Exposed] [2:Full Thickness Without Exposed] [3:Full Thickness Without Exposed] Classification: [1:Support Structures Large] [2:Support Structures Medium] [3:Support Structures Medium] Exudate Amount: [1:Serous] [2:Serous] [3:N/A] Exudate Type: [1:amber] [2:amber] [3:N/A] Exudate Color: [1:Flat and Intact] [2:Flat and Intact] [3:Flat and Intact] Wound Margin: [1:Large (67-100%)] [2:Large (67-100%)] [3:Large (67-100%)] Granulation Amount: [1:Red] [2:Red] [3:Red] Granulation Quality: [1:None Present (0%)] [2:None Present (0%)] [3:None Present (0%)] Necrotic Amount: [1:Fat Layer (Subcutaneous Tissue): Yes Fat Layer (Subcutaneous Tissue): Yes Fat Layer (Subcutaneous Tissue): Yes] Exposed Structures: [1:Fascia: No Tendon: No Muscle: No Joint: No Bone: No None] [2:Fascia: No Tendon: No Muscle: No Joint: No Bone: No None] [3:Fascia: No Tendon: No Muscle: No Joint: No Bone: No None] Epithelialization:  [1:Compression Therapy] [2:Compression Therapy] [3:Compression [place] Wound Number: [id number]  Photos: No Photos No Photos No Photos Left, Distal, Lateral Lower Leg Right, Anterior Lower Leg Right, Medial Lower Leg Wound Location: Gradually Appeared Gradually Appeared Gradually Appeared Wounding Event: Venous Leg Ulcer Venous Leg Ulcer Venous Leg Ulcer Primary Etiology: Lymphedema, Sleep Apnea, Lymphedema, Sleep Apnea, Lymphedema, Sleep Apnea, Comorbid History: Hypertension, Peripheral Venous Hypertension, Peripheral Venous Hypertension, Peripheral Venous Disease, Osteoarthritis, Confinement Disease, Osteoarthritis, Confinement Disease, Osteoarthritis, Confinement Anxiety Anxiety Anxiety 05/18/2020 05/18/2020 05/18/2020 Date Acquired: 0 0 0 Weeks of Treatment: Open Open Open Wound Status: 1.8x0.8x0.1 0.9x0.5x0.1 0.6x0.7x0.1 Measurements L x W x D (cm) 1.131 0.353 0.33 A (cm) : rea 0.113 0.035 0.033 Volume (cm) : Full Thickness Without Exposed Full Thickness Without Exposed Full Thickness Without Exposed Classification: Support Structures Support Structures Support Structures Medium Medium Medium Exudate Amount: Serous Serous Serous Exudate Type: Geophysical data processor Exudate Color: Flat and Intact Flat and Intact Flat and Intact Wound Margin: Large (67-100%) Medium (34-66%) Small (1-33%) Granulation Amount: Red, Pink Pink Pink Granulation Quality: Small (1-33%) Medium (34-66%) Large (67-100%) Necrotic Amount: Fat Layer (Subcutaneous Tissue): Yes Fat Layer (Subcutaneous Tissue): Yes Fat Layer (Subcutaneous Tissue): Yes Exposed Structures: Fascia: No Fascia: No Fascia: No Tendon: No Tendon: No Tendon: No Muscle: No Muscle: No Muscle: No Joint: No Joint: No Joint: No Bone: No Bone: No Bone: No None None None Epithelialization: Compression Therapy Compression Therapy Compression Therapy Procedures Performed: Wound Number: 7 N/A N/A Photos: No Photos N/A N/A Right,  Posterior Lower Leg N/A N/A Wound Location: Gradually Appeared N/A N/A Wounding Event: Venous Leg Ulcer N/A N/A Primary Etiology: Lymphedema, Sleep Apnea, N/A N/A Comorbid History: Hypertension, Peripheral Venous Disease, Osteoarthritis, Confinement Anxiety 05/18/2020 N/A N/A Date Acquired: 0 N/A N/A Weeks of Treatment: Open N/A N/A Wound Status: 0.7x1.2x0.1 N/A N/A Measurements L x W x D (cm) 0.66 N/A N/A A (cm) : rea 0.066 N/A N/A Volume (cm) : Full Thickness Without Exposed N/A N/A Classification: Support Structures Medium N/A N/A Exudate Amount: Serous N/A N/A Exudate Type: amber N/A N/A Exudate Color: Flat and Intact N/A N/A Wound Margin: Large (67-100%) N/A N/A Granulation Amount: Pink N/A N/A Granulation Quality: None Present (0%) N/A N/A Necrotic Amount: Fat Layer (Subcutaneous Tissue): Yes N/A N/A Exposed Structures: Fascia: No Tendon: No Muscle: No Joint: No Bone: No None N/A N/A Epithelialization: Compression Therapy N/A N/A Procedures Performed: Treatment Notes Electronic Signature(s) Signed: 06/16/2020 4:57:44 PM By: [name] MD Signed: 06/22/2020 9:09:54 AM By: [name] RN Entered By: [name] on 06/16/2020 15:27:25 -------------------------------------------------------------------------------- Multi-Disciplinary Care Plan Details  Patient Name: Date of Service: [name], [name] 06/16/2020 1:15 PM Medical Record Number: [id number] Patient Account Number: [id number] Date of Birth/Sex: Treating RN: 1971/04/03 (50 y.o. [name], [name] Primary Care Tyjon Bowen: [name] Other Clinician: Referring Seletha Zimmermann: Treating Hunner Garcon/Extender: [name] in Treatment: 0 Active Inactive Orientation to the Wound Care Program Nursing Diagnoses: Knowledge deficit related to the wound healing center program Goals: Patient/caregiver will verbalize understanding of the [place]  Program Date Initiated: 06/16/2020 Target Resolution Date: 07/17/2020 Goal Status: Active Interventions: Provide education on orientation to the wound center Notes: Wound/Skin Impairment Nursing Diagnoses: Impaired tissue integrity Knowledge deficit related to smoking impact on wound healing Goals: Patient will have a decrease in wound volume by X% from date: (specify in notes) Date Initiated: 06/16/2020 Target Resolution Date: 07/17/2020 Goal Status: Active Patient/caregiver will verbalize understanding of skin care regimen Date Initiated: 06/16/2020 Target Resolution Date: 07/17/2020 Goal Status: Active Ulcer/skin breakdown will have a volume reduction of 30% by week 4 Date Initiated: 06/16/2020 Target Resolution Date: 07/17/2020 Goal Status: Active Interventions: Assess patient/caregiver ability to obtain necessary supplies Assess patient/caregiver ability to perform ulcer/skin care regimen upon admission and as needed Assess ulceration(s) every visit Notes: Electronic Signature(s) Signed: 06/22/2020 9:09:54 AM By: [name] RN Entered By: [name] on 06/16/2020 14:38:28 -------------------------------------------------------------------------------- Pain Assessment Details Patient Name: Date of Service: Erica Gong. 06/16/2020 1:15 PM Medical Record Number: [id number] Patient Account Number: [id number] Date of Birth/Sex: Treating RN: [birth year] (50 y.o. [name] Primary Care Debborah Alonge: [name] Other Clinician: Referring Chery Giusto: Treating Purvis Sidle/Extender: [name] in Treatment: 0 Active Problems Location of Pain Severity and Description of Pain Patient Has Paino No Site Locations Pain Management and Medication Current Pain Management: Electronic Signature(s) Signed: 06/16/2020 5:18:27 PM By: Levan Hurst RN, BSN Entered By: Levan Hurst on 06/16/2020  13:45:24 -------------------------------------------------------------------------------- Patient/Caregiver Education Details Patient Name: Date of Service: Erica Dennison Mascot 2/1/2022andnbsp1:15 PM Medical Record Number: [id number] Patient Account Number: [id number] Date of Birth/Gender: Treating RN: 04/13/1971 (50 y.o. [name], [name] Primary Care Physician: [name] Other Clinician: Referring Physician: Treating Physician/Extender: [name] in Treatment: 0 Education Assessment Education Provided To: Patient Education Topics Provided Basic Hygiene: Methods: Explain/Verbal Responses: State content correctly Electronic Signature(s) Signed: 06/22/2020 9:09:54 AM By: [name] RN Entered By: [name] on 06/16/2020 14:38:37 -------------------------------------------------------------------------------- Wound Assessment Details Patient Name: Date of Service: Erica Dennison Mascot. 06/16/2020 1:15 PM Medical Record Number: [id number] Patient Account Number: [id number] Date of Birth/Sex: Treating RN: 11/16/1970 (50 y.o. [name] Primary Care Reagan Klemz: [name] Other Clinician: Referring Rishaan Gunner: Treating Derin Matthes/Extender: [name] in Treatment: 0 Wound Status Wound Number: 1 Primary Venous Leg Ulcer Etiology: Wound Location: Left, Anterior Lower Leg Wound Open Wounding Event: Gradually Appeared Status: Date Acquired: 05/18/2020 Comorbid Lymphedema, Sleep Apnea, Hypertension, Peripheral Venous Weeks Of Treatment: 0 History: Disease, Osteoarthritis, Confinement Anxiety Clustered Wound: No Wound Measurements Length: (cm) 5.8 Width: (cm) 6.7 Depth: (cm) 0.1 Area: (cm) 30.521 Volume: (cm) 3.052 % Reduction in Area: % Reduction in Volume: Epithelialization: None Tunneling: No Undermining: No Wound Description Classification: Full Thickness Without Exposed Support  Structures Wound Margin: Flat and Intact Exudate Amount: Large Exudate Type: Serous Exudate Color: amber Foul Odor After Cleansing: No Slough/Fibrino No Wound Bed Granulation Amount: Large (67-100%) Exposed Structure Granulation Quality: Red Fascia Exposed: No Necrotic Amount: None Present (0%) Fat Layer (Subcutaneous Tissue) Exposed: Yes Tendon Exposed: No Muscle Exposed:  No Joint Exposed: No Bone Exposed: No Treatment Notes Wound #1 (Lower Leg) Wound Laterality: Left, Anterior Cleanser Soap and Water Discharge Instruction: May shower and wash wound with dial antibacterial soap and water prior to dressing change. Wound Cleanser Discharge Instruction: Cleanse the wound with wound cleanser prior to applying a clean dressing using gauze sponges, not tissue or cotton balls. Peri-Wound Care Triamcinolone 15 (g) Discharge Instruction: Use triamcinolone 15 (g) as directed Sween Lotion (Moisturizing lotion) Discharge Instruction: Apply moisturizing lotion as directed Topical Triamcinolone Discharge Instruction: Apply Triamcinolone as directed Primary Dressing Hydrofera Blue Classic Foam, 2x2 in Discharge Instruction: Moisten with saline prior to applying to wound bed Secondary Dressing Woven Gauze Sponge, Non-Sterile 4x4 in Discharge Instruction: Apply over primary dressing as directed. ABD Pad, 5x9 Discharge Instruction: Apply over primary dressing as directed. Secured With Compression Wrap ThreePress (3 layer compression wrap) Discharge Instruction: Apply three layer compression as directed. unna Discharge Instruction: Wrap to top of leg to help hold 3 layer up Compression Stockings Add-Ons Electronic Signature(s) Signed: 06/16/2020 5:18:27 PM By: Levan Hurst RN, BSN Entered By: Levan Hurst on 06/16/2020 13:39:14 -------------------------------------------------------------------------------- Wound Assessment Details Patient Name: Date of Service: Erica Dennison Mascot.  06/16/2020 1:15 PM Medical Record Number: [id number] Patient Account Number: [id number] Date of Birth/Sex: Treating RN: [birth year] (50 y.o. [name] Primary Care Kennya Schwenn: [name] Other Clinician: Referring Calle Schader: Treating Maahi Lannan/Extender: [name] in Treatment: 0 Wound Status Wound Number: 2 Primary Venous Leg Ulcer Etiology: Wound Location: Left, Medial Lower Leg Wound Open Wounding Event: Gradually Appeared Status: Date Acquired: 05/18/2020 Comorbid Lymphedema, Sleep Apnea, Hypertension, Peripheral Venous Weeks Of Treatment: 0 History: Disease, Osteoarthritis, Confinement Anxiety Clustered Wound: No Wound Measurements Length: (cm) 1 Width: (cm) 1.9 Depth: (cm) 0.1 Area: (cm) 1.492 Volume: (cm) 0.149 % Reduction in Area: % Reduction in Volume: Epithelialization: None Tunneling: No Undermining: No Wound Description Classification: Full Thickness Without Exposed Support Structures Wound Margin: Flat and Intact Exudate Amount: Medium Exudate Type: Serous Exudate Color: amber Foul Odor After Cleansing: No Slough/Fibrino No Wound Bed Granulation Amount: Large (67-100%) Exposed Structure Granulation Quality: Red Fascia Exposed: No Necrotic Amount: None Present (0%) Fat Layer (Subcutaneous Tissue) Exposed: Yes Tendon Exposed: No Muscle Exposed: No Joint Exposed: No Bone Exposed: No Treatment Notes Wound #2 (Lower Leg) Wound Laterality: Left, Medial Cleanser Soap and Water Discharge Instruction: May shower and wash wound with dial antibacterial soap and water prior to dressing change. Wound Cleanser Discharge Instruction: Cleanse the wound with wound cleanser prior to applying a clean dressing using gauze sponges, not tissue or cotton balls. Peri-Wound Care Triamcinolone 15 (g) Discharge Instruction: Use triamcinolone 15 (g) as directed Sween Lotion (Moisturizing lotion) Discharge Instruction: Apply  moisturizing lotion as directed Topical Triamcinolone Discharge Instruction: Apply Triamcinolone as directed Primary Dressing Hydrofera Blue Classic Foam, 2x2 in Discharge Instruction: Moisten with saline prior to applying to wound bed Secondary Dressing Woven Gauze Sponge, Non-Sterile 4x4 in Discharge Instruction: Apply over primary dressing as directed. ABD Pad, 5x9 Discharge Instruction: Apply over primary dressing as directed. Secured With Compression Wrap ThreePress (3 layer compression wrap) Discharge Instruction: Apply three layer compression as directed. unna Discharge Instruction: Wrap to top of leg to help hold 3 layer up Compression Stockings Add-Ons Electronic Signature(s) Signed: 06/16/2020 5:18:27 PM By: Levan Hurst RN, BSN Entered By: Levan Hurst on 06/16/2020 13:40:30 -------------------------------------------------------------------------------- Wound Assessment Details Patient Name: Date of Service: Erica Dennison Mascot. 06/16/2020 1:15 PM Medical Record Number: [id number] Patient Account Number: [id number]  Date of Birth/Sex: Treating RN: Oct 28, 1970 (50 y.o. [name] Primary Care Vishwa Dais: [name] Other Clinician: Referring Bettyjane Shenoy: Treating Jonell Brumbaugh/Extender: [name] in Treatment: 0 Wound Status Wound Number: 3 Primary Venous Leg Ulcer Etiology: Wound Location: Left, Proximal, Lateral Lower Leg Wound Open Wounding Event: Gradually Appeared Status: Date Acquired: 05/18/2020 Comorbid Lymphedema, Sleep Apnea, Hypertension, Peripheral Venous Weeks Of Treatment: 0 History: Disease, Osteoarthritis, Confinement Anxiety Clustered Wound: No Wound Measurements Length: (cm) 1.5 Width: (cm) 2 Depth: (cm) 0.1 Area: (cm) 2.356 Volume: (cm) 0.236 Wound Description Classification: Full Thickness Without Exposed Support Structu Wound Margin: Flat and Intact Exudate Amount: Medium Foul Odor After  Cleansing: Slough/Fibrino % Reduction in Area: % Reduction in Volume: Epithelialization: None Tunneling: No Undermining: No res No No Wound Bed Granulation Amount: Large (67-100%) Exposed Structure Granulation Quality: Red Fascia Exposed: No Necrotic Amount: None Present (0%) Fat Layer (Subcutaneous Tissue) Exposed: Yes Tendon Exposed: No Muscle Exposed: No Joint Exposed: No Bone Exposed: No Treatment Notes Wound #3 (Lower Leg) Wound Laterality: Left, Lateral, Proximal Cleanser Soap and Water Discharge Instruction: May shower and wash wound with dial antibacterial soap and water prior to dressing change. Wound Cleanser Discharge Instruction: Cleanse the wound with wound cleanser prior to applying a clean dressing using gauze sponges, not tissue or cotton balls. Peri-Wound Care Triamcinolone 15 (g) Discharge Instruction: Use triamcinolone 15 (g) as directed Sween Lotion (Moisturizing lotion) Discharge Instruction: Apply moisturizing lotion as directed Topical Triamcinolone Discharge Instruction: Apply Triamcinolone as directed Primary Dressing Hydrofera Blue Classic Foam, 2x2 in Discharge Instruction: Moisten with saline prior to applying to wound bed Secondary Dressing Woven Gauze Sponge, Non-Sterile 4x4 in Discharge Instruction: Apply over primary dressing as directed. ABD Pad, 5x9 Discharge Instruction: Apply over primary dressing as directed. Secured With Compression Wrap ThreePress (3 layer compression wrap) Discharge Instruction: Apply three layer compression as directed. unna Discharge Instruction: Wrap to top of leg to help hold 3 layer up Compression Stockings Add-Ons Electronic Signature(s) Signed: 06/16/2020 5:18:27 PM By: Levan Hurst RN, BSN Entered By: Levan Hurst on 06/16/2020 13:41:41 -------------------------------------------------------------------------------- Wound Assessment Details Patient Name: Date of Service: Erica Dennison Mascot.  06/16/2020 1:15 PM Medical Record Number: [id number] Patient Account Number: [id number] Date of Birth/Sex: Treating RN: Jun 29, 1970 (50 y.o. [name] Primary Care Ysenia Filice: Other Clinician: [name] Referring Shamyra Farias: Treating Randilyn Foisy/Extender: [name] in Treatment: 0 Wound Status Wound Number: 4 Primary Venous Leg Ulcer Etiology: Wound Location: Left, Distal, Lateral Lower Leg Wound Open Wounding Event: Gradually Appeared Status: Date Acquired: 05/18/2020 Comorbid Lymphedema, Sleep Apnea, Hypertension, Peripheral Venous Weeks Of Treatment: 0 History: Disease, Osteoarthritis, Confinement Anxiety Clustered Wound: No Wound Measurements Length: (cm) 1.8 Width: (cm) 0.8 Depth: (cm) 0.1 Area: (cm) 1.131 Volume: (cm) 0.113 % Reduction in Area: % Reduction in Volume: Epithelialization: None Tunneling: No Undermining: No Wound Description Classification: Full Thickness Without Exposed Support Structures Wound Margin: Flat and Intact Exudate Amount: Medium Exudate Type: Serous Exudate Color: amber Foul Odor After Cleansing: No Slough/Fibrino Yes Wound Bed Granulation Amount: Large (67-100%) Exposed Structure Granulation Quality: Red, Pink Fascia Exposed: No Necrotic Amount: Small (1-33%) Fat Layer (Subcutaneous Tissue) Exposed: Yes Necrotic Quality: Adherent Slough Tendon Exposed: No Muscle Exposed: No Joint Exposed: No Bone Exposed: No Treatment Notes Wound #4 (Lower Leg) Wound Laterality: Left, Lateral, Distal Cleanser Soap and Water Discharge Instruction: May shower and wash wound with dial antibacterial soap and water prior to dressing change. Wound Cleanser Discharge Instruction: Cleanse the wound with wound  cleanser prior to applying a clean dressing using gauze sponges, not tissue or cotton balls. Peri-Wound Care Triamcinolone 15 (g) Discharge Instruction: Use triamcinolone 15 (g) as directed Sween Lotion  (Moisturizing lotion) Discharge Instruction: Apply moisturizing lotion as directed Topical Triamcinolone Discharge Instruction: Apply Triamcinolone as directed Primary Dressing Hydrofera Blue Classic Foam, 2x2 in Discharge Instruction: Moisten with saline prior to applying to wound bed Secondary Dressing Woven Gauze Sponge, Non-Sterile 4x4 in Discharge Instruction: Apply over primary dressing as directed. ABD Pad, 5x9 Discharge Instruction: Apply over primary dressing as directed. Secured With Compression Wrap ThreePress (3 layer compression wrap) Discharge Instruction: Apply three layer compression as directed. unna Discharge Instruction: Wrap to top of leg to help hold 3 layer up Compression Stockings Add-Ons Electronic Signature(s) Signed: 06/16/2020 5:18:27 PM By: Levan Hurst RN, BSN Entered By: Levan Hurst on 06/16/2020 13:42:30 -------------------------------------------------------------------------------- Wound Assessment Details Patient Name: Date of Service: Erica Dennison Mascot. 06/16/2020 1:15 PM Medical Record Number: [id number] Patient Account Number: [id number] Date of Birth/Sex: Treating RN: March 02, 1971 (50 y.o. [name] Primary Care Jomo Forand: [name] Other Clinician: Referring Nahsir Venezia: Treating Gabrelle Roca/Extender: [name] in Treatment: 0 Wound Status Wound Number: 5 Primary Venous Leg Ulcer Etiology: Wound Location: Right, Anterior Lower Leg Wound Open Wounding Event: Gradually Appeared Status: Date Acquired: 05/18/2020 Comorbid Lymphedema, Sleep Apnea, Hypertension, Peripheral Venous Weeks Of Treatment: 0 History: Disease, Osteoarthritis, Confinement Anxiety Clustered Wound: No Wound Measurements Length: (cm) 0.9 Width: (cm) 0.5 Depth: (cm) 0.1 Area: (cm) 0.353 Volume: (cm) 0.035 % Reduction in Area: % Reduction in Volume: Epithelialization: None Tunneling: No Undermining: No Wound  Description Classification: Full Thickness Without Exposed Support Structures Wound Margin: Flat and Intact Exudate Amount: Medium Exudate Type: Serous Exudate Color: amber Foul Odor After Cleansing: No Slough/Fibrino Yes Wound Bed Granulation Amount: Medium (34-66%) Exposed Structure Granulation Quality: Pink Fascia Exposed: No Necrotic Amount: Medium (34-66%) Fat Layer (Subcutaneous Tissue) Exposed: Yes Necrotic Quality: Adherent Slough Tendon Exposed: No Muscle Exposed: No Joint Exposed: No Bone Exposed: No Treatment Notes Wound #5 (Lower Leg) Wound Laterality: Right, Anterior Cleanser Soap and Water Discharge Instruction: May shower and wash wound with dial antibacterial soap and water prior to dressing change. Wound Cleanser Discharge Instruction: Cleanse the wound with wound cleanser prior to applying a clean dressing using gauze sponges, not tissue or cotton balls. Peri-Wound Care Triamcinolone 15 (g) Discharge Instruction: Use triamcinolone 15 (g) as directed Sween Lotion (Moisturizing lotion) Discharge Instruction: Apply moisturizing lotion as directed Topical Triamcinolone Discharge Instruction: Apply Triamcinolone as directed Primary Dressing Hydrofera Blue Classic Foam, 2x2 in Discharge Instruction: Moisten with saline prior to applying to wound bed Secondary Dressing Woven Gauze Sponge, Non-Sterile 4x4 in Discharge Instruction: Apply over primary dressing as directed. ABD Pad, 5x9 Discharge Instruction: Apply over primary dressing as directed. Secured With Compression Wrap ThreePress (3 layer compression wrap) Discharge Instruction: Apply three layer compression as directed. unna Discharge Instruction: Wrap to top of leg to help hold 3 layer up Compression Stockings Add-Ons Electronic Signature(s) Signed: 06/16/2020 5:18:27 PM By: Levan Hurst RN, BSN Entered By: Levan Hurst on 06/16/2020  13:43:28 -------------------------------------------------------------------------------- Wound Assessment Details Patient Name: Date of Service: Erica Dennison Mascot. 06/16/2020 1:15 PM Medical Record Number: [id number] Patient Account Number: [id number] Date of Birth/Sex: Treating RN: [birth year] (50 y.o. [name] Primary Care Jazziel Fitzsimmons: [name] Other Clinician: Referring Clydell Alberts: Treating Nyrah Demos/Extender: [name] in Treatment: 0 Wound Status Wound Number: 6 Primary Venous Leg Ulcer Etiology: Wound Location: Right,  Medial Lower Leg Wound Open Wounding Event: Gradually Appeared Status: Date Acquired: 05/18/2020 Comorbid Lymphedema, Sleep Apnea, Hypertension, Peripheral Venous Weeks Of Treatment: 0 History: Disease, Osteoarthritis, Confinement Anxiety Clustered Wound: No Wound Measurements Length: (cm) 0.6 Width: (cm) 0.7 Depth: (cm) 0.1 Area: (cm) 0.33 Volume: (cm) 0.033 % Reduction in Area: % Reduction in Volume: Epithelialization: None Tunneling: No Undermining: No Wound Description Classification: Full Thickness Without Exposed Support Structures Wound Margin: Flat and Intact Exudate Amount: Medium Exudate Type: Serous Exudate Color: amber Foul Odor After Cleansing: No Slough/Fibrino Yes Wound Bed Granulation Amount: Small (1-33%) Exposed Structure Granulation Quality: Pink Fascia Exposed: No Necrotic Amount: Large (67-100%) Fat Layer (Subcutaneous Tissue) Exposed: Yes Necrotic Quality: Adherent Slough Tendon Exposed: No Muscle Exposed: No Joint Exposed: No Bone Exposed: No Treatment Notes Wound #6 (Lower Leg) Wound Laterality: Right, Medial Cleanser Soap and Water Discharge Instruction: May shower and wash wound with dial antibacterial soap and water prior to dressing change. Wound Cleanser Discharge Instruction: Cleanse the wound with wound cleanser prior to applying a clean dressing using gauze sponges,  not tissue or cotton balls. Peri-Wound Care Triamcinolone 15 (g) Discharge Instruction: Use triamcinolone 15 (g) as directed Sween Lotion (Moisturizing lotion) Discharge Instruction: Apply moisturizing lotion as directed Topical Triamcinolone Discharge Instruction: Apply Triamcinolone as directed Primary Dressing Hydrofera Blue Classic Foam, 2x2 in Discharge Instruction: Moisten with saline prior to applying to wound bed Secondary Dressing Woven Gauze Sponge, Non-Sterile 4x4 in Discharge Instruction: Apply over primary dressing as directed. ABD Pad, 5x9 Discharge Instruction: Apply over primary dressing as directed. Secured With Compression Wrap ThreePress (3 layer compression wrap) Discharge Instruction: Apply three layer compression as directed. unna Discharge Instruction: Wrap to top of leg to help hold 3 layer up Compression Stockings Add-Ons Electronic Signature(s) Signed: 06/16/2020 5:18:27 PM By: Levan Hurst RN, BSN Entered By: Levan Hurst on 06/16/2020 13:44:18 -------------------------------------------------------------------------------- Wound Assessment Details Patient Name: Date of Service: Erica Dennison Mascot. 06/16/2020 1:15 PM Medical Record Number: [id number] Patient Account Number: [id number] Date of Birth/Sex: Treating RN: Jul 11, 1970 (50 y.o. [name] Primary Care Carmeron Heady: [name] Other Clinician: Referring Raliyah Montella: Treating Jamarkus Lisbon/Extender: [name] in Treatment: 0 Wound Status Wound Number: 7 Primary Venous Leg Ulcer Etiology: Wound Location: Right, Posterior Lower Leg Wound Open Wounding Event: Gradually Appeared Status: Status: Date Acquired: 05/18/2020 Comorbid Lymphedema, Sleep Apnea, Hypertension, Peripheral Venous Weeks Of Treatment: 0 History: Disease, Osteoarthritis, Confinement Anxiety Clustered Wound: No Wound Measurements Length: (cm) 0.7 Width: (cm) 1.2 Depth: (cm) 0.1 Area:  (cm) 0.66 Volume: (cm) 0.066 % Reduction in Area: % Reduction in Volume: Epithelialization: None Tunneling: No Undermining: No Wound Description Classification: Full Thickness Without Exposed Support Structures Wound Margin: Flat and Intact Exudate Amount: Medium Exudate Type: Serous Exudate Color: amber Foul Odor After Cleansing: No Slough/Fibrino No Wound Bed Granulation Amount: Large (67-100%) Exposed Structure Granulation Quality: Pink Fascia Exposed: No Necrotic Amount: None Present (0%) Fat Layer (Subcutaneous Tissue) Exposed: Yes Tendon Exposed: No Muscle Exposed: No Joint Exposed: No Bone Exposed: No Treatment Notes Wound #7 (Lower Leg) Wound Laterality: Right, Posterior Cleanser Soap and Water Discharge Instruction: May shower and wash wound with dial antibacterial soap and water prior to dressing change. Wound Cleanser Discharge Instruction: Cleanse the wound with wound cleanser prior to applying a clean dressing using gauze sponges, not tissue or cotton balls. Peri-Wound Care Triamcinolone 15 (g) Discharge Instruction: Use triamcinolone 15 (g) as directed Sween Lotion (Moisturizing lotion) Discharge Instruction: Apply moisturizing lotion as directed Topical Triamcinolone Discharge  Instruction: Apply Triamcinolone as directed Primary Dressing Hydrofera Blue Classic Foam, 2x2 in Discharge Instruction: Moisten with saline prior to applying to wound bed Secondary Dressing Woven Gauze Sponge, Non-Sterile 4x4 in Discharge Instruction: Apply over primary dressing as directed. ABD Pad, 5x9 Discharge Instruction: Apply over primary dressing as directed. Secured With Compression Wrap ThreePress (3 layer compression wrap) Discharge Instruction: Apply three layer compression as directed. unna Discharge Instruction: Wrap to top of leg to help hold 3 layer up Compression Stockings Add-Ons Electronic Signature(s) Signed: 06/16/2020 5:18:27 PM By: Levan Hurst  RN, BSN Entered By: Levan Hurst on 06/16/2020 13:44:59 -------------------------------------------------------------------------------- Vitals Details Patient Name: Date of Service: Erica Dennison Mascot. 06/16/2020 1:15 PM Medical Record Number: [id number] Patient Account Number: [id number] Date of Birth/Sex: Treating RN: 1970/10/29 (50 y.o. [name] Primary Care Annalyse Langlais: [name] Other Clinician: Referring Veverly Larimer: Treating Krystan Northrop/Extender: [name] in Treatment: 0 Vital Signs Time Taken: 13:00 Temperature (F): 97.6 Height (in): 62 Pulse (bpm): 65 Source: Stated Respiratory Rate (breaths/min): 18 Weight (lbs): 304 Blood Pressure (mmHg): 114/73 Source: Stated Reference Range: 80 - 120 mg / dl Body Mass Index (BMI): 55.6 Electronic Signature(s) Signed: 06/16/2020 5:18:27 PM By: Levan Hurst RN, BSN Entered By: Levan Hurst on 06/16/2020 13:08:50

## 2020-06-22 NOTE — Progress Notes (Signed)
[name], [name] ([id number]) Visit Report for 06/16/2020 Chief Complaint Document Details Patient Name: Date of Service: HA [name], [name] 06/16/2020 1:15 PM Medical Record Number: [id number] Patient Account Number: [id number] Date of Birth/Sex: Treating RN: 1970/06/11 (50 y.o. [name], [name] Primary Care Provider: [name] Other Clinician: Referring Provider: Treating Provider/Extender: [name] in Treatment: 0 Information Obtained from: Patient Chief Complaint 06/16/2020; patient is here for review of wounds on her bilateral lower legs left greater than right Electronic Signature(s) Signed: 06/16/2020 4:57:44 PM By: [name] MD Entered By: [name] on 06/16/2020 15:28:06 -------------------------------------------------------------------------------- HPI Details Patient Name: Date of Service: Erica Devoid K. 06/16/2020 1:15 PM Medical Record Number: [id number] Patient Account Number: [id number] Date of Birth/Sex: Treating RN: 1970-07-29 (50 y.o. [name], [name] Primary Care Provider: [name] Other Clinician: Referring Provider: Treating Provider/Extender: [name] in Treatment: 0 History of Present Illness HPI Description: ADMISSION 06/16/2020 This is a 50 year old complicated woman. She states she has had chronic swelling and draining from her legs for many many years. She has been prescribed compression stockings in the past but does not wear them because they are too tight. She has been dealing with open areas on the left and the right leg for several weeks now. There is drainage coming out of the area on the left leg. She saw her primary earlier this month and was put on Augmentin. A culture was apparently done remotely showing MSSA. More recently she has been of applying Neosporin and Bactroban. She has 4 open areas on the left and 3 on the right. Patient also has a complicated medical  and dermatologic history. She has a history of cutaneous polyarteritis nodosa apparently diagnosed by a biopsies of her skin in the lower legs. She was on immunosuppressive's with Humira for a while but not currently. She also has psoriasis. Was followed by dermatology at [facility] healthcare Dr. Romona Curls. Her notes were very helpful to review. Also has morbid obesity continuing to continuing to contribute to both venous and lymphatic issues. In the past she is just simply been unable to tolerate compression stockings. She is onSkyriza For her psoriasis. She was hospitalized in March of this year for cellulitis of both legs with sepsis. Past medical history includes psoriasis, hyperlipidemia, chronic lower extremity edema as described, cutaneous polyarteritis nodosa, sleep apnea fibromyalgia, osteoarthritis of both knees Patient's ABIs in our clinic were 0.96 on the right and 1.05 on the left she had venous reflux studies in May 2018. At that point on the right she had normal reflux times noted in the greater saphenous vein with no evidence of DVT No indirect evidence of obstruction proximal to the inguinal ligament. No evidence . of chronic venous insufficiency bilaterally and no evidence of a DVT Electronic Signature(s) Signed: 06/16/2020 4:57:44 PM By: [name] MD Entered By: [name] on 06/16/2020 15:35:24 -------------------------------------------------------------------------------- Physical Exam Details Patient Name: Date of Service: Erica Devoid K. 06/16/2020 1:15 PM Medical Record Number: [id number] Patient Account Number: [id number] Date of Birth/Sex: Treating RN: [birth year] (50 y.o. [name], [name] Primary Care Provider: [name] Other Clinician: Referring Provider: Treating Provider/Extender: [name] in Treatment: 0 Constitutional Sitting or standing Blood Pressure is within target range for patient.. Pulse regular and  within target range for patient.Marland Kitchen Respirations regular, non-labored and within target range.. Temperature is normal and within the target range for the patient.Marland Kitchen Appears in no distress. Cardiovascular Heart rhythm and rate  regular, without murmur or gallop.Marland Kitchen Nonpitting edema in both lower extremities. Gastrointestinal (GI) Obese but no masses nontender. Integumentary (Hair, Skin) What would look like stasis dermatitis on the left greater than right leg. Notes Wound exam The area on the left is worse. There is broken down areas on the anterior medial and lateral part of the left leg surrounding erythema that is nontender and not warm. I think this represents what would look to be stasis dermatitis however this may all be secondary mainly to lymphedema. On the right the changes are more modest. 2 small areas one anteriorly and 1 medially. All of these areas are superficial there is no depth. This looks like venous insufficiency ulcerations Electronic Signature(s) Signed: 06/16/2020 4:57:44 PM By: [name] MD Entered By: [name] on 06/16/2020 15:37:11 -------------------------------------------------------------------------------- Physician Orders Details Patient Name: Date of Service: Erica Devoid K. 06/16/2020 1:15 PM Medical Record Number: [id number] Patient Account Number: [id number] Date of Birth/Sex: Treating RN: 12/29/1970 (50 y.o. [name], [name] Primary Care Provider: [name] Other Clinician: Referring Provider: Treating Provider/Extender: [name] in Treatment: 0 Verbal / Phone Orders: No Diagnosis Coding Follow-up Appointments Return Appointment in 1 week. Bathing/ Shower/ Hygiene May shower with protection but do not get wound dressing(s) wet. - May use cast protectors...you can find those at [facility] or [facility] Edema Control - Lymphedema / SCD / Other Elevate legs to the level of the heart or above for 30 minutes daily  and/or when sitting, a frequency of: Avoid standing for long periods of time. Wound Treatment Wound #1 - Lower Leg Wound Laterality: Left, Anterior Cleanser: Soap and Water 1 x Per Week/15 Days Discharge Instructions: May shower and wash wound with dial antibacterial soap and water prior to dressing change. Cleanser: Wound Cleanser 1 x Per Week/15 Days Discharge Instructions: Cleanse the wound with wound cleanser prior to applying a clean dressing using gauze sponges, not tissue or cotton balls. Peri-Wound Care: Triamcinolone 15 (g) 1 x Per Week/15 Days Discharge Instructions: Use triamcinolone 15 (g) as directed Peri-Wound Care: Sween Lotion (Moisturizing lotion) 1 x Per Week/15 Days Discharge Instructions: Apply moisturizing lotion as directed Topical: Triamcinolone 1 x Per Week/15 Days Discharge Instructions: Apply Triamcinolone as directed Prim Dressing: Hydrofera Blue Classic Foam, 2x2 in 1 x Per Week/15 Days ary Discharge Instructions: Moisten with saline prior to applying to wound bed Secondary Dressing: Woven Gauze Sponge, Non-Sterile 4x4 in 1 x Per Week/15 Days Discharge Instructions: Apply over primary dressing as directed. Secondary Dressing: ABD Pad, 5x9 1 x Per Week/15 Days Discharge Instructions: Apply over primary dressing as directed. Compression Wrap: ThreePress (3 layer compression wrap) 1 x Per Week/15 Days Discharge Instructions: Apply three layer compression as directed. Compression Wrap: unna 1 x Per Week/15 Days Discharge Instructions: Wrap to top of leg to help hold 3 layer up Wound #2 - Lower Leg Wound Laterality: Left, Medial Cleanser: Soap and Water 1 x Per Week/15 Days Discharge Instructions: May shower and wash wound with dial antibacterial soap and water prior to dressing change. Cleanser: Wound Cleanser 1 x Per Week/15 Days Discharge Instructions: Cleanse the wound with wound cleanser prior to applying a clean dressing using gauze sponges, not tissue or  cotton balls. Peri-Wound Care: Triamcinolone 15 (g) 1 x Per Week/15 Days Discharge Instructions: Use triamcinolone 15 (g) as directed Peri-Wound Care: Sween Lotion (Moisturizing lotion) 1 x Per Week/15 Days Discharge Instructions: Apply moisturizing lotion as directed Topical: Triamcinolone 1 x Per Week/15 Days Discharge Instructions: Apply  Triamcinolone as directed Prim Dressing: Hydrofera Blue Classic Foam, 2x2 in 1 x Per Week/15 Days ary Discharge Instructions: Moisten with saline prior to applying to wound bed Secondary Dressing: Woven Gauze Sponge, Non-Sterile 4x4 in 1 x Per Week/15 Days Discharge Instructions: Apply over primary dressing as directed. Secondary Dressing: ABD Pad, 5x9 1 x Per Week/15 Days Discharge Instructions: Apply over primary dressing as directed. Compression Wrap: ThreePress (3 layer compression wrap) 1 x Per Week/15 Days Discharge Instructions: Apply three layer compression as directed. Compression Wrap: unna 1 x Per Week/15 Days Discharge Instructions: Wrap to top of leg to help hold 3 layer up Wound #3 - Lower Leg Wound Laterality: Left, Lateral, Proximal Cleanser: Soap and Water 1 x Per Week/15 Days Discharge Instructions: May shower and wash wound with dial antibacterial soap and water prior to dressing change. Cleanser: Wound Cleanser 1 x Per Week/15 Days Discharge Instructions: Cleanse the wound with wound cleanser prior to applying a clean dressing using gauze sponges, not tissue or cotton balls. Peri-Wound Care: Triamcinolone 15 (g) 1 x Per Week/15 Days Discharge Instructions: Use triamcinolone 15 (g) as directed Peri-Wound Care: Sween Lotion (Moisturizing lotion) 1 x Per Week/15 Days Discharge Instructions: Apply moisturizing lotion as directed Topical: Triamcinolone 1 x Per Week/15 Days Discharge Instructions: Apply Triamcinolone as directed Prim Dressing: Hydrofera Blue Classic Foam, 2x2 in 1 x Per Week/15 Days ary Discharge Instructions: Moisten  with saline prior to applying to wound bed Secondary Dressing: Woven Gauze Sponge, Non-Sterile 4x4 in 1 x Per Week/15 Days Discharge Instructions: Apply over primary dressing as directed. Secondary Dressing: ABD Pad, 5x9 1 x Per Week/15 Days Discharge Instructions: Apply over primary dressing as directed. Compression Wrap: ThreePress (3 layer compression wrap) 1 x Per Week/15 Days Discharge Instructions: Apply three layer compression as directed. Compression Wrap: unna 1 x Per Week/15 Days Discharge Instructions: Wrap to top of leg to help hold 3 layer up Wound #4 - Lower Leg Wound Laterality: Left, Lateral, Distal Cleanser: Soap and Water 1 x Per Week/15 Days Discharge Instructions: May shower and wash wound with dial antibacterial soap and water prior to dressing change. Cleanser: Wound Cleanser 1 x Per Week/15 Days Discharge Instructions: Cleanse the wound with wound cleanser prior to applying a clean dressing using gauze sponges, not tissue or cotton balls. Peri-Wound Care: Triamcinolone 15 (g) 1 x Per Week/15 Days Discharge Instructions: Use triamcinolone 15 (g) as directed Peri-Wound Care: Sween Lotion (Moisturizing lotion) 1 x Per Week/15 Days Discharge Instructions: Apply moisturizing lotion as directed Topical: Triamcinolone 1 x Per Week/15 Days Discharge Instructions: Apply Triamcinolone as directed Prim Dressing: Hydrofera Blue Classic Foam, 2x2 in 1 x Per Week/15 Days ary Discharge Instructions: Moisten with saline prior to applying to wound bed Secondary Dressing: Woven Gauze Sponge, Non-Sterile 4x4 in 1 x Per Week/15 Days Discharge Instructions: Apply over primary dressing as directed. Secondary Dressing: ABD Pad, 5x9 1 x Per Week/15 Days Discharge Instructions: Apply over primary dressing as directed. Compression Wrap: ThreePress (3 layer compression wrap) 1 x Per Week/15 Days Discharge Instructions: Apply three layer compression as directed. Compression Wrap: unna 1 x Per  Week/15 Days Discharge Instructions: Wrap to top of leg to help hold 3 layer up Wound #5 - Lower Leg Wound Laterality: Right, Anterior Cleanser: Soap and Water 1 x Per Week/15 Days Discharge Instructions: May shower and wash wound with dial antibacterial soap and water prior to dressing change. Cleanser: Wound Cleanser 1 x Per Week/15 Days Discharge Instructions: Cleanse the wound with wound  cleanser prior to applying a clean dressing using gauze sponges, not tissue or cotton balls. Peri-Wound Care: Triamcinolone 15 (g) 1 x Per Week/15 Days Discharge Instructions: Use triamcinolone 15 (g) as directed Peri-Wound Care: Sween Lotion (Moisturizing lotion) 1 x Per Week/15 Days Discharge Instructions: Apply moisturizing lotion as directed Topical: Triamcinolone 1 x Per Week/15 Days Discharge Instructions: Apply Triamcinolone as directed Prim Dressing: Hydrofera Blue Classic Foam, 2x2 in 1 x Per Week/15 Days ary Discharge Instructions: Moisten with saline prior to applying to wound bed Secondary Dressing: Woven Gauze Sponge, Non-Sterile 4x4 in 1 x Per Week/15 Days Discharge Instructions: Apply over primary dressing as directed. Secondary Dressing: ABD Pad, 5x9 1 x Per Week/15 Days Discharge Instructions: Apply over primary dressing as directed. Compression Wrap: ThreePress (3 layer compression wrap) 1 x Per Week/15 Days Discharge Instructions: Apply three layer compression as directed. Compression Wrap: unna 1 x Per Week/15 Days Discharge Instructions: Wrap to top of leg to help hold 3 layer up Wound #6 - Lower Leg Wound Laterality: Right, Medial Cleanser: Soap and Water 1 x Per Week/15 Days Discharge Instructions: May shower and wash wound with dial antibacterial soap and water prior to dressing change. Cleanser: Wound Cleanser 1 x Per Week/15 Days Discharge Instructions: Cleanse the wound with wound cleanser prior to applying a clean dressing using gauze sponges, not tissue or cotton  balls. Peri-Wound Care: Triamcinolone 15 (g) 1 x Per Week/15 Days Discharge Instructions: Use triamcinolone 15 (g) as directed Peri-Wound Care: Sween Lotion (Moisturizing lotion) 1 x Per Week/15 Days Discharge Instructions: Apply moisturizing lotion as directed Topical: Triamcinolone 1 x Per Week/15 Days Discharge Instructions: Apply Triamcinolone as directed Prim Dressing: Hydrofera Blue Classic Foam, 2x2 in 1 x Per Week/15 Days ary Discharge Instructions: Moisten with saline prior to applying to wound bed Secondary Dressing: Woven Gauze Sponge, Non-Sterile 4x4 in 1 x Per Week/15 Days Discharge Instructions: Apply over primary dressing as directed. Secondary Dressing: ABD Pad, 5x9 1 x Per Week/15 Days Discharge Instructions: Apply over primary dressing as directed. Compression Wrap: ThreePress (3 layer compression wrap) 1 x Per Week/15 Days Discharge Instructions: Apply three layer compression as directed. Compression Wrap: unna 1 x Per Week/15 Days Discharge Instructions: Wrap to top of leg to help hold 3 layer up Wound #7 - Lower Leg Wound Laterality: Right, Posterior Cleanser: Soap and Water 1 x Per Week/15 Days Discharge Instructions: May shower and wash wound with dial antibacterial soap and water prior to dressing change. Cleanser: Wound Cleanser 1 x Per Week/15 Days Discharge Instructions: Cleanse the wound with wound cleanser prior to applying a clean dressing using gauze sponges, not tissue or cotton balls. Peri-Wound Care: Triamcinolone 15 (g) 1 x Per Week/15 Days Discharge Instructions: Use triamcinolone 15 (g) as directed Peri-Wound Care: Sween Lotion (Moisturizing lotion) 1 x Per Week/15 Days Discharge Instructions: Apply moisturizing lotion as directed Topical: Triamcinolone 1 x Per Week/15 Days Discharge Instructions: Apply Triamcinolone as directed Prim Dressing: Hydrofera Blue Classic Foam, 2x2 in 1 x Per Week/15 Days ary Discharge Instructions: Moisten with saline  prior to applying to wound bed Secondary Dressing: Woven Gauze Sponge, Non-Sterile 4x4 in 1 x Per Week/15 Days Discharge Instructions: Apply over primary dressing as directed. Secondary Dressing: ABD Pad, 5x9 1 x Per Week/15 Days Discharge Instructions: Apply over primary dressing as directed. Compression Wrap: ThreePress (3 layer compression wrap) 1 x Per Week/15 Days Discharge Instructions: Apply three layer compression as directed. Compression Wrap: unna 1 x Per Week/15 Days Discharge Instructions: Wrap to  top of leg to help hold 3 layer up Electronic Signature(s) Signed: 06/16/2020 4:57:44 PM By: [name] MD Signed: 06/22/2020 9:09:54 AM By: [name] RN Entered By: [name] on 06/16/2020 14:53:13 -------------------------------------------------------------------------------- Problem List Details Patient Name: Date of Service: [name] 06/16/2020 1:15 PM Medical Record Number: [id number] Patient Account Number: [id number] Date of Birth/Sex: Treating RN: 16-May-1971 (50 y.o. [name], [name] Primary Care Provider: [name] Other Clinician: Referring Provider: Treating Provider/Extender: [name] in Treatment: 0 Active Problems ICD-10 Encounter Code Description Active Date MDM Diagnosis I87.333 Chronic venous hypertension (idiopathic) with ulcer and inflammation of 06/16/2020 No Yes bilateral lower extremity I89.0 Lymphedema, not elsewhere classified 06/16/2020 No Yes M30.0 Polyarteritis nodosa 06/16/2020 No Yes L97.821 Non-pressure chronic ulcer of other part of left lower leg limited to breakdown 06/16/2020 No Yes of skin L97.811 Non-pressure chronic ulcer of other part of right lower leg limited to breakdown 06/16/2020 No Yes of skin Inactive Problems Resolved Problems Electronic Signature(s) Signed: 06/16/2020 4:57:44 PM By: [name] MD Entered By: [name] on 06/16/2020  15:21:14 -------------------------------------------------------------------------------- Progress Note Details Patient Name: Date of Service: [name] 06/16/2020 1:15 PM Medical Record Number: [id number] Patient Account Number: [id number] Date of Birth/Sex: Treating RN: Dec 26, 1970 (50 y.o. [name], [name] Primary Care Provider: [name] Other Clinician: Referring Provider: Treating Provider/Extender: [name] in Treatment: 0 Subjective Chief Complaint Information obtained from Patient 06/16/2020; patient is here for review of wounds on her bilateral lower legs left greater than right History of Present Illness (HPI) ADMISSION 06/16/2020 This is a 50 year old complicated woman. She states she has had chronic swelling and draining from her legs for many many years. She has been prescribed compression stockings in the past but does not wear them because they are too tight. She has been dealing with open areas on the left and the right leg for several weeks now. There is drainage coming out of the area on the left leg. She saw her primary earlier this month and was put on Augmentin. A culture was apparently done remotely showing MSSA. More recently she has been of applying Neosporin and Bactroban. She has 4 open areas on the left and 3 on the right. Patient also has a complicated medical and dermatologic history. She has a history of cutaneous polyarteritis nodosa apparently diagnosed by a biopsies of her skin in the lower legs. She was on immunosuppressive's with Humira for a while but not currently. She also has psoriasis. Was followed by dermatology at [facility] healthcare Dr. Romona Curls. Her notes were very helpful to review. Also has morbid obesity continuing to continuing to contribute to both venous and lymphatic issues. In the past she is just simply been unable to tolerate compression stockings. She is onSkyriza For her psoriasis. She was  hospitalized in March of this year for cellulitis of both legs with sepsis. Past medical history includes psoriasis, hyperlipidemia, chronic lower extremity edema as described, cutaneous polyarteritis nodosa, sleep apnea fibromyalgia, osteoarthritis of both knees Patient's ABIs in our clinic were 0.96 on the right and 1.05 on the left she had venous reflux studies in May 2018. At that point on the right she had normal reflux times noted in the greater saphenous vein with no evidence of DVT No indirect evidence of obstruction proximal to the inguinal ligament. No evidence . of chronic venous insufficiency bilaterally and no evidence of a DVT Patient History Information obtained from Patient. Allergies niacin (Severity:  Severe, Reaction: anaphylaxis), sulfamethoxazole (Severity: Severe, Reaction: anaphylaxis), aspirin (Severity: Moderate, Reaction: hives), Bactrim (Severity: Moderate, Reaction: hives), benzoin (Severity: Moderate, Reaction: rash), cephalexin (Severity: Moderate, Reaction: hives (has tolerated ceftriaxone and cefepime)), clindamycin (Severity: Moderate, Reaction: hives), doxycycline (Severity: Moderate, Reaction: hives), iohexol (Severity: Moderate, Reaction: hives), lisinopril (Severity: Moderate, Reaction: hives), naproxen (Severity: Moderate, Reaction: hives), Sulfa (Sulfonamide Antibiotics) (Severity: Moderate, Reaction: rash) Family History Cancer - Father,Maternal Grandparents, Diabetes - Mother, Heart Disease - Father, Hypertension - Mother, Lung Disease - Mother, No family history of Hereditary Spherocytosis. Social History Never smoker, Marital Status - Single, Alcohol Use - Never, Drug Use - No History, Caffeine Use - Rarely. Medical History Hematologic/Lymphatic Patient has history of Lymphedema - lower legs Respiratory Patient has history of Sleep Apnea Cardiovascular Patient has history of Hypertension, Peripheral Venous Disease Musculoskeletal Patient has  history of Osteoarthritis Psychiatric Patient has history of Confinement Anxiety Medical A Surgical History Notes nd Cardiovascular Hyperlipidemia Musculoskeletal polyarthritis nodosa, fibromyalgia Review of Systems (ROS) Constitutional Symptoms (General Health) Denies complaints or symptoms of Fatigue, Fever, Chills, Marked Weight Change. Eyes Denies complaints or symptoms of Dry Eyes, Vision Changes, Glasses / Contacts. Ear/Nose/Mouth/Throat Denies complaints or symptoms of Chronic sinus problems or rhinitis. Gastrointestinal Denies complaints or symptoms of Frequent diarrhea, Nausea, Vomiting. Endocrine Denies complaints or symptoms of Heat/cold intolerance. Integumentary (Skin) Complains or has symptoms of Wounds - bilateral lower leg wounds. Neurologic Denies complaints or symptoms of Numbness/parasthesias. Objective Constitutional Sitting or standing Blood Pressure is within target range for patient.. Pulse regular and within target range for patient.Marland Kitchen Respirations regular, non-labored and within target range.. Temperature is normal and within the target range for the patient.Marland Kitchen Appears in no distress. Vitals Time Taken: 1:00 PM, Height: 62 in, Source: Stated, Weight: 304 lbs, Source: Stated, BMI: 55.6, Temperature: 97.6 F, Pulse: 65 bpm, Respiratory Rate: 18 breaths/min, Blood Pressure: 114/73 mmHg. Cardiovascular Heart rhythm and rate regular, without murmur or gallop.Marland Kitchen Nonpitting edema in both lower extremities. Gastrointestinal (GI) Obese but no masses nontender. General Notes: Wound exam ooThe area on the left is worse. There is broken down areas on the anterior medial and lateral part of the left leg surrounding erythema that is nontender and not warm. I think this represents what would look to be stasis dermatitis however this may all be secondary mainly to lymphedema. ooOn the right the changes are more modest. 2 small areas one anteriorly and 1 medially. ooAll  of these areas are superficial there is no depth. This looks like venous insufficiency ulcerations Integumentary (Hair, Skin) What would look like stasis dermatitis on the left greater than right leg. Wound #1 status is Open. Original cause of wound was Gradually Appeared. The wound is located on the Left,Anterior Lower Leg. The wound measures 5.8cm length x 6.7cm width x 0.1cm depth; 30.521cm^2 area and 3.052cm^3 volume. There is Fat Layer (Subcutaneous Tissue) exposed. There is no tunneling or undermining noted. There is a large amount of serous drainage noted. The wound margin is flat and intact. There is large (67-100%) red granulation within the wound bed. There is no necrotic tissue within the wound bed. Wound #2 status is Open. Original cause of wound was Gradually Appeared. The wound is located on the Left,Medial Lower Leg. The wound measures 1cm length x 1.9cm width x 0.1cm depth; 1.492cm^2 area and 0.149cm^3 volume. There is Fat Layer (Subcutaneous Tissue) exposed. There is no tunneling or undermining noted. There is a medium amount of serous drainage noted. The wound margin is flat and intact.  There is large (67-100%) red granulation within the wound bed. There is no necrotic tissue within the wound bed. Wound #3 status is Open. Original cause of wound was Gradually Appeared. The wound is located on the Left,Proximal,Lateral Lower Leg. The wound measures 1.5cm length x 2cm width x 0.1cm depth; 2.356cm^2 area and 0.236cm^3 volume. There is Fat Layer (Subcutaneous Tissue) exposed. There is no tunneling or undermining noted. There is a medium amount of drainage noted. The wound margin is flat and intact. There is large (67-100%) red granulation within the wound bed. There is no necrotic tissue within the wound bed. Wound #4 status is Open. Original cause of wound was Gradually Appeared. The wound is located on the Left,Distal,Lateral Lower Leg. The wound measures 1.8cm length x 0.8cm width x  0.1cm depth; 1.131cm^2 area and 0.113cm^3 volume. There is Fat Layer (Subcutaneous Tissue) exposed. There is no tunneling or undermining noted. There is a medium amount of serous drainage noted. The wound margin is flat and intact. There is large (67-100%) red, pink granulation within the wound bed. There is a small (1-33%) amount of necrotic tissue within the wound bed including Adherent Slough. Wound #5 status is Open. Original cause of wound was Gradually Appeared. The wound is located on the Right,Anterior Lower Leg. The wound measures 0.9cm length x 0.5cm width x 0.1cm depth; 0.353cm^2 area and 0.035cm^3 volume. There is Fat Layer (Subcutaneous Tissue) exposed. There is no tunneling or undermining noted. There is a medium amount of serous drainage noted. The wound margin is flat and intact. There is medium (34-66%) pink granulation within the wound bed. There is a medium (34-66%) amount of necrotic tissue within the wound bed including Adherent Slough. Wound #6 status is Open. Original cause of wound was Gradually Appeared. The wound is located on the Right,Medial Lower Leg. The wound measures 0.6cm length x 0.7cm width x 0.1cm depth; 0.33cm^2 area and 0.033cm^3 volume. There is Fat Layer (Subcutaneous Tissue) exposed. There is no tunneling or undermining noted. There is a medium amount of serous drainage noted. The wound margin is flat and intact. There is small (1-33%) pink granulation within the wound bed. There is a large (67-100%) amount of necrotic tissue within the wound bed including Adherent Slough. Wound #7 status is Open. Original cause of wound was Gradually Appeared. The wound is located on the Right,Posterior Lower Leg. The wound measures 0.7cm length x 1.2cm width x 0.1cm depth; 0.66cm^2 area and 0.066cm^3 volume. There is Fat Layer (Subcutaneous Tissue) exposed. There is no tunneling or undermining noted. There is a medium amount of serous drainage noted. The wound margin is flat  and intact. There is large (67-100%) pink granulation within the wound bed. There is no necrotic tissue within the wound bed. 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 There was a Three Layer Compression Therapy Procedure by [name], RN. Post procedure Diagnosis Wound #1: Same as Pre-Procedure Wound #2 Pre-procedure diagnosis of Wound #2 is a Venous Leg Ulcer located on the Left,Medial Lower Leg . There was a Three Layer Compression Therapy Procedure by [name], RN. Post procedure Diagnosis Wound #2:  Same as Pre-Procedure Wound #3 Pre-procedure diagnosis of Wound #3 is a Venous Leg Ulcer located on the Left,Proximal,Lateral Lower Leg . There was a Three Layer Compression Therapy Procedure by [name], RN. Post procedure Diagnosis Wound #3: Same as Pre-Procedure Wound #4 Pre-procedure diagnosis of Wound #4 is a Venous Leg Ulcer located on the Left,Distal,Lateral Lower Leg . There was a Three Layer Compression Therapy Procedure by [name], RN. Post procedure Diagnosis Wound #4: Same as Pre-Procedure Wound #5 Pre-procedure diagnosis of Wound #5 is a Venous Leg Ulcer located on the Right,Anterior Lower Leg . There was a Three Layer Compression Therapy Procedure by [name], RN. Post procedure Diagnosis Wound #5: Same as Pre-Procedure Wound #6 Pre-procedure diagnosis of Wound #6 is a Venous Leg Ulcer located on the Right,Medial Lower Leg . There was a Three Layer Compression Therapy Procedure by [name], RN. Post procedure Diagnosis Wound #6: Same as Pre-Procedure Wound #7 Pre-procedure diagnosis of Wound #7 is a Venous Leg Ulcer located on the Right,Posterior Lower Leg . There was a Three Layer Compression Therapy Procedure by [name], RN. Post procedure Diagnosis Wound #7: Same as Pre-Procedure Plan Follow-up Appointments: Return Appointment in 1 week. Bathing/ Shower/ Hygiene: May shower with protection but do not get wound dressing(s) wet. - May use cast protectors...you can find those at [facility] or [facility] Edema Control - Lymphedema / SCD / Other: Elevate legs to the level of the heart or above for 30 minutes daily and/or when sitting, a frequency of: Avoid standing for long periods of time. WOUND #1: - Lower Leg Wound Laterality: Left, Anterior Cleanser: Soap and Water 1 x Per Week/15 Days Discharge Instructions: May shower and wash wound with dial antibacterial soap and water prior to dressing change. Cleanser: Wound Cleanser 1 x Per Week/15 Days Discharge Instructions: Cleanse the wound with wound cleanser prior to applying a clean dressing using gauze sponges, not tissue or cotton balls. Peri-Wound Care: Triamcinolone 15 (g) 1 x Per Week/15 Days Discharge Instructions: Use triamcinolone 15 (g) as directed Peri-Wound Care: Sween Lotion (Moisturizing lotion) 1 x Per Week/15 Days Discharge Instructions: Apply moisturizing lotion as directed Topical: Triamcinolone 1 x Per Week/15 Days Discharge Instructions: Apply Triamcinolone as directed Prim Dressing: Hydrofera Blue Classic Foam, 2x2 in 1 x Per Week/15 Days ary Discharge Instructions: Moisten with saline prior to applying to wound bed Secondary Dressing: Woven Gauze Sponge, Non-Sterile 4x4 in 1 x Per Week/15 Days Discharge Instructions: Apply over primary dressing as directed. Secondary Dressing: ABD Pad, 5x9 1 x Per Week/15 Days Discharge Instructions: Apply over primary dressing as directed. Com pression  Wrap: ThreePress (3 layer compression wrap) 1 x Per Week/15 Days Discharge Instructions: Apply three layer compression as directed. Com pression Wrap: unna 1 x Per Week/15 Days Discharge Instructions: Wrap to top of leg to help hold 3 layer up WOUND #2: - Lower Leg Wound Laterality: Left, Medial Cleanser: Soap and Water 1 x Per Week/15 Days Discharge Instructions: May shower and wash wound with dial antibacterial soap and water prior to dressing change. Cleanser: Wound Cleanser 1 x Per Week/15 Days Discharge Instructions: Cleanse the wound with wound cleanser prior to applying a clean dressing using gauze sponges, not tissue or cotton balls. Peri-Wound Care: Triamcinolone 15 (g) 1 x Per Week/15 Days Discharge Instructions: Use triamcinolone 15 (g) as directed Peri-Wound Care: Sween Lotion (Moisturizing lotion) 1 x Per Week/15 Days Discharge Instructions: Apply moisturizing lotion as directed Topical: Triamcinolone 1 x Per Week/15 Days Discharge Instructions:  Apply Triamcinolone as directed Prim Dressing: Hydrofera Blue Classic Foam, 2x2 in 1 x Per Week/15 Days ary Discharge Instructions: Moisten with saline prior to applying to wound bed Secondary Dressing: Woven Gauze Sponge, Non-Sterile 4x4 in 1 x Per Week/15 Days Discharge Instructions: Apply over primary dressing as directed. Secondary Dressing: ABD Pad, 5x9 1 x Per Week/15 Days Discharge Instructions: Apply over primary dressing as directed. Com pression Wrap: ThreePress (3 layer compression wrap) 1 x Per Week/15 Days Discharge Instructions: Apply three layer compression as directed. Com pression Wrap: unna 1 x Per Week/15 Days Discharge Instructions: Wrap to top of leg to help hold 3 layer up WOUND #3: - Lower Leg Wound Laterality: Left, Lateral, Proximal Cleanser: Soap and Water 1 x Per Week/15 Days Discharge Instructions: May shower and wash wound with dial antibacterial soap and water prior to dressing change. Cleanser: Wound  Cleanser 1 x Per Week/15 Days Discharge Instructions: Cleanse the wound with wound cleanser prior to applying a clean dressing using gauze sponges, not tissue or cotton balls. Peri-Wound Care: Triamcinolone 15 (g) 1 x Per Week/15 Days Discharge Instructions: Use triamcinolone 15 (g) as directed Peri-Wound Care: Sween Lotion (Moisturizing lotion) 1 x Per Week/15 Days Discharge Instructions: Apply moisturizing lotion as directed Topical: Triamcinolone 1 x Per Week/15 Days Discharge Instructions: Apply Triamcinolone as directed Prim Dressing: Hydrofera Blue Classic Foam, 2x2 in 1 x Per Week/15 Days ary Discharge Instructions: Moisten with saline prior to applying to wound bed Secondary Dressing: Woven Gauze Sponge, Non-Sterile 4x4 in 1 x Per Week/15 Days Discharge Instructions: Apply over primary dressing as directed. Secondary Dressing: ABD Pad, 5x9 1 x Per Week/15 Days Discharge Instructions: Apply over primary dressing as directed. Com pression Wrap: ThreePress (3 layer compression wrap) 1 x Per Week/15 Days Discharge Instructions: Apply three layer compression as directed. Com pression Wrap: unna 1 x Per Week/15 Days Discharge Instructions: Wrap to top of leg to help hold 3 layer up WOUND #4: - Lower Leg Wound Laterality: Left, Lateral, Distal Cleanser: Soap and Water 1 x Per Week/15 Days Discharge Instructions: May shower and wash wound with dial antibacterial soap and water prior to dressing change. Cleanser: Wound Cleanser 1 x Per Week/15 Days Discharge Instructions: Cleanse the wound with wound cleanser prior to applying a clean dressing using gauze sponges, not tissue or cotton balls. Peri-Wound Care: Triamcinolone 15 (g) 1 x Per Week/15 Days Discharge Instructions: Use triamcinolone 15 (g) as directed Peri-Wound Care: Sween Lotion (Moisturizing lotion) 1 x Per Week/15 Days Discharge Instructions: Apply moisturizing lotion as directed Topical: Triamcinolone 1 x Per Week/15  Days Discharge Instructions: Apply Triamcinolone as directed Prim Dressing: Hydrofera Blue Classic Foam, 2x2 in 1 x Per Week/15 Days ary Discharge Instructions: Moisten with saline prior to applying to wound bed Secondary Dressing: Woven Gauze Sponge, Non-Sterile 4x4 in 1 x Per Week/15 Days Discharge Instructions: Apply over primary dressing as directed. Secondary Dressing: ABD Pad, 5x9 1 x Per Week/15 Days Discharge Instructions: Apply over primary dressing as directed. Com pression Wrap: ThreePress (3 layer compression wrap) 1 x Per Week/15 Days Discharge Instructions: Apply three layer compression as directed. Com pression Wrap: unna 1 x Per Week/15 Days Discharge Instructions: Wrap to top of leg to help hold 3 layer up WOUND #5: - Lower Leg Wound Laterality: Right, Anterior Cleanser: Soap and Water 1 x Per Week/15 Days Discharge Instructions: May shower and wash wound with dial antibacterial soap and water prior to dressing change. Cleanser: Wound Cleanser 1 x Per Week/15 Days  Discharge Instructions: Cleanse the wound with wound cleanser prior to applying a clean dressing using gauze sponges, not tissue or cotton balls. Peri-Wound Care: Triamcinolone 15 (g) 1 x Per Week/15 Days Discharge Instructions: Use triamcinolone 15 (g) as directed Peri-Wound Care: Sween Lotion (Moisturizing lotion) 1 x Per Week/15 Days Discharge Instructions: Apply moisturizing lotion as directed Topical: Triamcinolone 1 x Per Week/15 Days Discharge Instructions: Apply Triamcinolone as directed Prim Dressing: Hydrofera Blue Classic Foam, 2x2 in 1 x Per Week/15 Days ary Discharge Instructions: Moisten with saline prior to applying to wound bed Secondary Dressing: Woven Gauze Sponge, Non-Sterile 4x4 in 1 x Per Week/15 Days Discharge Instructions: Apply over primary dressing as directed. Secondary Dressing: ABD Pad, 5x9 1 x Per Week/15 Days Discharge Instructions: Apply over primary dressing as directed. Com  pression Wrap: ThreePress (3 layer compression wrap) 1 x Per Week/15 Days Discharge Instructions: Apply three layer compression as directed. Com pression Wrap: unna 1 x Per Week/15 Days Discharge Instructions: Wrap to top of leg to help hold 3 layer up WOUND #6: - Lower Leg Wound Laterality: Right, Medial Cleanser: Soap and Water 1 x Per Week/15 Days Discharge Instructions: May shower and wash wound with dial antibacterial soap and water prior to dressing change. Cleanser: Wound Cleanser 1 x Per Week/15 Days Discharge Instructions: Cleanse the wound with wound cleanser prior to applying a clean dressing using gauze sponges, not tissue or cotton balls. Peri-Wound Care: Triamcinolone 15 (g) 1 x Per Week/15 Days Discharge Instructions: Use triamcinolone 15 (g) as directed Peri-Wound Care: Sween Lotion (Moisturizing lotion) 1 x Per Week/15 Days Discharge Instructions: Apply moisturizing lotion as directed Topical: Triamcinolone 1 x Per Week/15 Days Discharge Instructions: Apply Triamcinolone as directed Prim Dressing: Hydrofera Blue Classic Foam, 2x2 in 1 x Per Week/15 Days ary Discharge Instructions: Moisten with saline prior to applying to wound bed Secondary Dressing: Woven Gauze Sponge, Non-Sterile 4x4 in 1 x Per Week/15 Days Discharge Instructions: Apply over primary dressing as directed. Secondary Dressing: ABD Pad, 5x9 1 x Per Week/15 Days Discharge Instructions: Apply over primary dressing as directed. Com pression Wrap: ThreePress (3 layer compression wrap) 1 x Per Week/15 Days Discharge Instructions: Apply three layer compression as directed. Com pression Wrap: unna 1 x Per Week/15 Days Discharge Instructions: Wrap to top of leg to help hold 3 layer up WOUND #7: - Lower Leg Wound Laterality: Right, Posterior Cleanser: Soap and Water 1 x Per Week/15 Days Discharge Instructions: May shower and wash wound with dial antibacterial soap and water prior to dressing change. Cleanser: Wound  Cleanser 1 x Per Week/15 Days Discharge Instructions: Cleanse the wound with wound cleanser prior to applying a clean dressing using gauze sponges, not tissue or cotton balls. Peri-Wound Care: Triamcinolone 15 (g) 1 x Per Week/15 Days Discharge Instructions: Use triamcinolone 15 (g) as directed Peri-Wound Care: Sween Lotion (Moisturizing lotion) 1 x Per Week/15 Days Discharge Instructions: Apply moisturizing lotion as directed Topical: Triamcinolone 1 x Per Week/15 Days Discharge Instructions: Apply Triamcinolone as directed Prim Dressing: Hydrofera Blue Classic Foam, 2x2 in 1 x Per Week/15 Days ary Discharge Instructions: Moisten with saline prior to applying to wound bed Secondary Dressing: Woven Gauze Sponge, Non-Sterile 4x4 in 1 x Per Week/15 Days Discharge Instructions: Apply over primary dressing as directed. Secondary Dressing: ABD Pad, 5x9 1 x Per Week/15 Days Discharge Instructions: Apply over primary dressing as directed. Com pression Wrap: ThreePress (3 layer compression wrap) 1 x Per Week/15 Days Discharge Instructions: Apply three layer compression as directed.  Com pression Wrap: unna 1 x Per Week/15 Days Discharge Instructions: Wrap to top of leg to help hold 3 layer up 1. We will use Hydrofera Blue to all wound areas 2. Liberal TCA with 3 layer compression 3. Although her reflux studies from 2018 did not show significant venous reflux the state of her legs does look like that. This may be predominantly lymphedema however. I wonder whether she has a central cause of her chronic venous hypertension. 4. We will see how she does in 3 layer compression. I think she would tolerate for her peripheral pulses were easily palpable and her ABIs were normal 5. No evidence of infection 6. I asked her to keep her legs elevated when she is sitting. Ultimately were going to have to find some form of compression stocking that she can wear on an ongoing basis 7. It is indeed interesting that  the patient has cutaneous polyarteritis nodosa but she is not on any immunosuppressive's. I would envision wounds from polyarteritis nodosa looking like punched out arteriolar wounds rather than what this patient has stasis looking wounds. She also has psoriasis I did not see anything in the wounded area that look like psoriasis was contributing I spent 45 minutes in review of this patient's past medical history, face-to-face evaluation and preparation of this record Electronic Signature(s) Signed: 06/16/2020 4:57:44 PM By: [name] MD Entered By: [name] on 06/16/2020 15:40:00 -------------------------------------------------------------------------------- HxROS Details Patient Name: Date of Service: [name] 06/16/2020 1:15 PM Medical Record Number: [id number] Patient Account Number: [id number] Date of Birth/Sex: Treating RN: November 27, 1970 (50 y.o. [name] Primary Care Provider: [name] Other Clinician: Referring Provider: Treating Provider/Extender: [name] in Treatment: 0 Information Obtained From Patient Constitutional Symptoms (General Health) Complaints and Symptoms: Negative for: Fatigue; Fever; Chills; Marked Weight Change Eyes Complaints and Symptoms: Negative for: Dry Eyes; Vision Changes; Glasses / Contacts Ear/Nose/Mouth/Throat Complaints and Symptoms: Negative for: Chronic sinus problems or rhinitis Gastrointestinal Complaints and Symptoms: Negative for: Frequent diarrhea; Nausea; Vomiting Endocrine Complaints and Symptoms: Negative for: Heat/cold intolerance Integumentary (Skin) Complaints and Symptoms: Positive for: Wounds - bilateral lower leg wounds Neurologic Complaints and Symptoms: Negative for: Numbness/parasthesias Hematologic/Lymphatic Medical History: Positive for: Lymphedema - lower legs Respiratory Medical History: Positive for: Sleep Apnea Cardiovascular Medical  History: Positive for: Hypertension; Peripheral Venous Disease Past Medical History Notes: Hyperlipidemia Immunological Musculoskeletal Medical History: Positive for: Osteoarthritis Past Medical History Notes: polyarthritis nodosa, fibromyalgia Oncologic Psychiatric Medical History: Positive for: Confinement Anxiety Immunizations Pneumococcal Vaccine: Received Pneumococcal Vaccination: Yes Implantable Devices None Family and Social History Cancer: Yes - Father,Maternal Grandparents; Diabetes: Yes - Mother; Heart Disease: Yes - Father; Hereditary Spherocytosis: No; Hypertension: Yes - Mother; Lung Disease: Yes - Mother; Never smoker; Marital Status - Single; Alcohol Use: Never; Drug Use: No History; Caffeine Use: Rarely; Financial Concerns: No; Food, Clothing or Shelter Needs: No; Support System Lacking: No; Transportation Concerns: No Engineer, maintenance) Signed: 06/16/2020 4:57:44 PM By: [name] MD Signed: 06/16/2020 5:18:27 PM By: Levan Hurst RN, BSN Entered By: Levan Hurst on 06/16/2020 13:24:02 -------------------------------------------------------------------------------- SuperBill Details Patient Name: Date of Service: [name] 06/16/2020 Medical Record Number: [id number] Patient Account Number: [id number] Date of Birth/Sex: Treating RN: 03/17/1971 (50 y.o. [name], [name] Primary Care Provider: [name] Other Clinician: Referring Provider: Treating Provider/Extender: [name] in Treatment: 0 Diagnosis Coding ICD-10 Codes Code Description [phone] Chronic venous hypertension (idiopathic) with ulcer and inflammation of bilateral lower extremity I89.0 Lymphedema,  not elsewhere classified M30.0 Polyarteritis nodosa L97.821 Non-pressure chronic ulcer of other part of left lower leg limited to breakdown of skin L97.811 Non-pressure chronic ulcer of other part of right lower leg limited to breakdown of  skin Facility Procedures CPT4: CPT4 Description Modifier Quantity Code [id number] - WOUND CARE VISIT-LEV 3 EST PT 1 CPT4: [id number] BILATERAL: Application of multi-layer venous compression system; leg (below knee), including ankle and 1 foot. Physician Procedures : CPT4 Code Description Modifier [id number] - WC PHYS LEVEL 4 - NEW PT ICD-10 Diagnosis Description I87.333 Chronic venous hypertension (idiopathic) with ulcer and inflammation of bilateral lower extremity I89.0 Lymphedema, not elsewhere classified  L97.821 Non-pressure chronic ulcer of other part of left lower leg limited to breakdown of skin L97.811 Non-pressure chronic ulcer of other part of right lower leg limited to breakdown of skin Quantity: 1 Electronic Signature(s) Signed: 06/16/2020 4:57:44 PM By: [name] MD Entered By: [name] on 06/16/2020 15:40:45

## 2020-06-23 ENCOUNTER — Encounter (HOSPITAL_BASED_OUTPATIENT_CLINIC_OR_DEPARTMENT_OTHER): Payer: Medicare Other | Admitting: Internal Medicine

## 2020-06-23 ENCOUNTER — Other Ambulatory Visit: Payer: Self-pay

## 2020-06-23 DIAGNOSIS — L97821 Non-pressure chronic ulcer of other part of left lower leg limited to breakdown of skin: Secondary | ICD-10-CM | POA: Diagnosis not present

## 2020-06-23 DIAGNOSIS — I872 Venous insufficiency (chronic) (peripheral): Secondary | ICD-10-CM | POA: Diagnosis not present

## 2020-06-23 DIAGNOSIS — L97812 Non-pressure chronic ulcer of other part of right lower leg with fat layer exposed: Secondary | ICD-10-CM | POA: Diagnosis not present

## 2020-06-23 DIAGNOSIS — I87333 Chronic venous hypertension (idiopathic) with ulcer and inflammation of bilateral lower extremity: Secondary | ICD-10-CM | POA: Diagnosis not present

## 2020-06-23 DIAGNOSIS — L97811 Non-pressure chronic ulcer of other part of right lower leg limited to breakdown of skin: Secondary | ICD-10-CM | POA: Diagnosis not present

## 2020-06-23 DIAGNOSIS — M300 Polyarteritis nodosa: Secondary | ICD-10-CM | POA: Diagnosis not present

## 2020-06-23 DIAGNOSIS — I890 Lymphedema, not elsewhere classified: Secondary | ICD-10-CM | POA: Diagnosis not present

## 2020-06-23 DIAGNOSIS — L97822 Non-pressure chronic ulcer of other part of left lower leg with fat layer exposed: Secondary | ICD-10-CM | POA: Diagnosis not present

## 2020-06-23 NOTE — Progress Notes (Signed)
BRAILEY, BUESCHER ([id number]) Visit Report for 06/23/2020 Arrival Information Details Patient Name: Date of Service: [name], [name] 06/23/2020 9:45 A M Medical Record Number: [id number] Patient Account Number: [id number] Date of Birth/Sex: Treating RN: 06-Sep-1970 (50 y.o. [name], [name] Primary Care Ilo Beamon: [name] Other Clinician: Referring Sherese Heyward: Treating Chaniyah Jahr/Extender: [name] in Treatment: 1 Visit Information History Since Last Visit Added or deleted any medications: No Patient Arrived: Ambulatory Any new allergies or adverse reactions: No Arrival Time: 09:57 Had a fall or experienced change in No Accompanied By: self activities of daily living that may affect Transfer Assistance: None risk of falls: Patient Identification Verified: Yes Signs or symptoms of abuse/neglect since last visito No Secondary Verification Process Completed: Yes Hospitalized since last visit: No Patient Requires Transmission-Based Precautions: No Implantable device outside of the clinic excluding No Patient Has Alerts: No cellular tissue based products placed in the center since last visit: Has Dressing in Place as Prescribed: Yes Pain Present Now: Yes Electronic Signature(s) Signed: 06/23/2020 10:38:58 AM By: Sandre Kitty Entered By: Sandre Kitty on 06/23/2020 10:01:58 -------------------------------------------------------------------------------- Compression Therapy Details Patient Name: Date of Service: Erica Gong. 06/23/2020 9:45 A M Medical Record Number: [id number] Patient Account Number: [id number] Date of Birth/Sex: Treating RN: [birth year] (50 y.o. [name], [name] Primary Care Tiane Szydlowski: [name] Other Clinician: Referring Rashawna Scoles: Treating Henya Aguallo/Extender: [name] in Treatment: 1 Compression Therapy Performed for Wound Assessment: Wound #1 Left,Anterior Lower Leg Performed By:  Clinician [name], RN Compression Type: Three Layer Post Procedure Diagnosis Same as Pre-procedure Electronic Signature(s) Signed: 06/23/2020 5:05:22 PM By: [name] RN Entered By: [name] on 06/23/2020 10:42:59 -------------------------------------------------------------------------------- Compression Therapy Details Patient Name: Date of Service: Erica Gong. 06/23/2020 9:45 A M Medical Record Number: [id number] Patient Account Number: [id number] Date of Birth/Sex: Treating RN: 11-28-1970 (50 y.o. [name], [name] Primary Care Abb Gobert: [name] Other Clinician: Referring Krystofer Hevener: Treating Aerial Dilley/Extender: [name] in Treatment: 1 Compression Therapy Performed for Wound Assessment: Wound #5 Right,Anterior Lower Leg Performed By: Clinician [name], RN Compression Type: Three Layer Post Procedure Diagnosis Same as Pre-procedure Electronic Signature(s) Signed: 06/23/2020 5:05:22 PM By: [name] RN Entered By: [name] on 06/23/2020 10:42:59 -------------------------------------------------------------------------------- Compression Therapy Details Patient Name: Date of Service: Erica Gong. 06/23/2020 9:45 A M Medical Record Number: [id number] Patient Account Number: [id number] Date of Birth/Sex: Treating RN: 07/12/1970 (50 y.o. [name], [name] Primary Care Arcadio Cope: [name] Other Clinician: Referring Demetrius Barrell: Treating Ellinor Test/Extender: [name] in Treatment: 1 Compression Therapy Performed for Wound Assessment: Wound #2 Left,Medial Lower Leg Performed By: Clinician [name], RN Compression Type: Three Layer Post Procedure Diagnosis Same as Pre-procedure Electronic Signature(s) Signed: 06/23/2020 5:05:22 PM By: [name] RN Entered By: [name] on 06/23/2020  10:42:59 -------------------------------------------------------------------------------- Compression Therapy Details Patient Name: Date of Service: Erica Gong. 06/23/2020 9:45 A M Medical Record Number: [id number] Patient Account Number: [id number] Date of Birth/Sex: Treating RN: 1970-11-24 (50 y.o. [name], [name] Primary Care Beckey Polkowski: [name] Other Clinician: Referring [place] Huberty: Treating Hezzie Karim/Extender: [name] in Treatment: 1 Compression Therapy Performed for Wound Assessment: Wound #3 Left,Proximal,Lateral Lower Leg Performed By: Clinician [name], RN Compression Type: Three Layer Post Procedure Diagnosis Same as Pre-procedure Electronic Signature(s) Signed: 06/23/2020 5:05:22 PM By: [name] RN Signed: 06/23/2020 5:05:22 PM By: [name] RN Entered By: [name] on 06/23/2020 10:43:00 -------------------------------------------------------------------------------- Compression Therapy Details Patient  Name: Date of Service: [name], [name] 06/23/2020 9:45 A M Medical Record Number: [id number] Patient Account Number: [id number] Date of Birth/Sex: Treating RN: [birth year] (50 y.o. [name], [name] Primary Care Lexie Morini: [name] Other Clinician: Referring Nathanial Arrighi: Treating  Grosser/Extender: [name] in Treatment: 1 Compression Therapy Performed for Wound Assessment: Wound #4 Left,Distal,Lateral Lower Leg Performed By: Clinician [name], RN Compression Type: Three Layer Post Procedure Diagnosis Same as Pre-procedure Electronic Signature(s) Signed: 06/23/2020 5:05:22 PM By: [name] RN Entered By: [name] on 06/23/2020 10:43:00 -------------------------------------------------------------------------------- Compression Therapy Details Patient Name: Date of Service: Erica Gong. 06/23/2020 9:45 A M Medical Record  Number: [id number] Patient Account Number: [id number] Date of Birth/Sex: Treating RN: 01-03-1971 (50 y.o. [name], [name] Primary Care Kennadi Albany: [name] Other Clinician: Referring Braylinn Gulden: Treating Exilda Wilhite/Extender: [name] in Treatment: 1 Compression Therapy Performed for Wound Assessment: Wound #6 Right,Medial Lower Leg Performed By: Clinician [name], RN Compression Type: Three Layer Post Procedure Diagnosis Same as Pre-procedure Electronic Signature(s) Signed: 06/23/2020 5:05:22 PM By: [name] RN Entered By: [name] on 06/23/2020 10:43:00 -------------------------------------------------------------------------------- Encounter Discharge Information Details Patient Name: Date of Service: Erica Devoid K. 06/23/2020 9:45 A M Medical Record Number: [id number] Patient Account Number: [id number] Date of Birth/Sex: Treating RN: 03/26/1971 (50 y.o. [name] Primary Care Awa Bachicha: [name] Other Clinician: Referring Michaelyn Wall: Treating Sicily Zaragoza/Extender: [name] in Treatment: 1 Encounter Discharge Information Items Post Procedure Vitals Discharge Condition: Stable Temperature (F): 98.2 Ambulatory Status: Ambulatory Pulse (bpm): 75 Discharge Destination: Home Respiratory Rate (breaths/min): 18 Transportation: Private Auto Blood Pressure (mmHg): 115/68 Accompanied By: alone Schedule Follow-up Appointment: Yes Clinical Summary of Care: Patient Declined Electronic Signature(s) Signed: 06/23/2020 5:04:56 PM By: Levan Hurst RN, BSN Entered By: Levan Hurst on 06/23/2020 13:30:56 -------------------------------------------------------------------------------- Lower Extremity Assessment Details Patient Name: Date of Service: Erica Devoid K. 06/23/2020 9:45 A M Medical Record Number: [id number] Patient Account Number: [id number] Date of Birth/Sex: Treating  RN: 29-Mar-1971 (50 y.o. [name] Primary Care Esiah Bazinet: [name] Other Clinician: Referring Ritter Helsley: Treating Emmary Culbreath/Extender: [name] in Treatment: 1 Edema Assessment Assessed: Shirlyn Goltz: No] Patrice Paradise: No] Edema: [Left: Yes] [Right: Yes] Calf Left: Right: Point of Measurement: 28 cm From Medial Instep 46 cm 42.5 cm Ankle Left: Right: Point of Measurement: 11 cm From Medial Instep 28 cm 27 cm Vascular Assessment Pulses: Dorsalis Pedis Palpable: [Left:Yes] [Right:Yes] Electronic Signature(s) Signed: 06/23/2020 5:04:56 PM By: Levan Hurst RN, BSN Entered By: Levan Hurst on 06/23/2020 10:25:42 -------------------------------------------------------------------------------- Multi Wound Chart Details Patient Name: Date of Service: Erica Devoid K. 06/23/2020 9:45 A M Medical Record Number: [id number] Patient Account Number: [id number] Date of Birth/Sex: Treating RN: 02-Feb-1971 (50 y.o. [name], [name] Primary Care Cardale Dorer: [name] Other Clinician: Referring Whitman Meinhardt: Treating Shawntel Farnworth/Extender: [name] in Treatment: 1 Vital Signs Height(in): 63 Pulse(bpm): 83 Weight(lbs): 304 Blood Pressure(mmHg): 115/68 Body Mass Index(BMI): 56 Temperature(F): 98.2 Respiratory Rate(breaths/min): 18 Photos: [1:No Photos Left, Anterior Lower Leg] [2:No Photos Left, Medial Lower Leg] [3:No Photos Left, Proximal, Lateral Lower Leg] Wound Location: [1:Gradually Appeared] [2:Gradually Appeared] [3:Gradually Appeared] Wounding Event: [1:Venous Leg Ulcer] [2:Venous Leg Ulcer] [3:Venous Leg Ulcer] Primary Etiology: [1:Lymphedema, Sleep Apnea,] [2:Lymphedema, Sleep Apnea,] [3:Lymphedema, Sleep Apnea,] Comorbid History: [1:Hypertension, Peripheral Venous Disease, Osteoarthritis, Confinement Disease, Osteoarthritis, Confinement Disease, Osteoarthritis, Confinement Anxiety 05/18/2020] [2:Hypertension,  Peripheral Venous Anxiety 05/18/2020] [3:Hypertension,  Peripheral Venous Anxiety 05/18/2020] Date Acquired: [1:1] [2:1] [3:1] Weeks of  Treatment: [1:Open] [2:Open] [3:Open] Wound Status: [1:5.2x5.7x0.1] [2:0.5x1.4x0.1] [3:1x0.5x0.1] Measurements L x W x D (cm) [1:23.279] [2:0.55] [3:0.393] A (cm) : rea [1:2.328] [2:0.055] [3:0.039] Volume (cm) : [1:23.70%] [2:63.10%] [3:83.30%] % Reduction in A [1:rea: 23.70%] [2:63.10%] [3:83.50%] % Reduction in Volume: [1:Full Thickness Without Exposed] [2:Full Thickness Without Exposed] [3:Full Thickness Without Exposed] Classification: [1:Support Structures Large] [2:Support Structures Medium] [3:Support Structures Medium] Exudate A mount: [1:Serous] [2:Serous] [3:Serous] Exudate Type: [1:amber] [2:amber] [3:amber] Exudate Color: [1:Flat and Intact] [2:Flat and Intact] [3:Flat and Intact] Wound Margin: [1:Small (1-33%)] [2:Medium (34-66%)] [3:Medium (34-66%)] Granulation A mount: [1:Pink] [2:Pink] [3:Pink] Granulation Quality: [1:Large (67-100%)] [2:Medium (34-66%)] [3:Medium (34-66%)] Necrotic A mount: [1:Fat Layer (Subcutaneous Tissue): Yes Fat Layer (Subcutaneous Tissue): Yes Fat Layer (Subcutaneous Tissue): Yes] Exposed Structures: [1:Fascia: No Tendon: No Muscle: No Joint: No Bone: No Small (1-33%)] [2:Fascia: No Tendon: No Muscle: No Joint: No Bone: No Small (1-33%)] [3:Fascia: No Tendon: No Muscle: No Joint: No Bone: No Small (1-33%)] Epithelialization: [1:Debridement - Excisional] [2:N/A] [3:N/A] Debridement: Pre-procedure Verification/Time Out 10:38 [2:N/A] [3:N/A] Taken: [1:Lidocaine] [2:N/A] [3:N/A] Pain Control: [1:Subcutaneous, Slough] [2:N/A] [3:N/A] Tissue Debrided: [1:Skin/Subcutaneous Tissue] [2:N/A] [3:N/A] Level: [1:29.64] [2:N/A] [3:N/A] Debridement A (sq cm): [1:rea Curette] [2:N/A] [3:N/A] Instrument: [1:Minimum] [2:N/A] [3:N/A] Bleeding: [1:Pressure] [2:N/A] [3:N/A] Hemostasis A chieved: [1:0] [2:N/A] [3:N/A] Procedural  Pain: [1:0] [2:N/A] [3:N/A] Post Procedural Pain: [1:Procedure was tolerated well] [2:N/A] [3:N/A] Debridement Treatment Response: [1:5.2x5.7x0.1] [2:N/A] [3:N/A] Post Debridement Measurements L x W x D (cm) [1:2.328] [2:N/A] [3:N/A] Post Debridement Volume: (cm) [1:Compression Therapy] [2:Compression Therapy] [3:Compression Therapy] Procedures Performed: [1:Debridement] [2:4] [3:5] Photos: [1:No Photos Left, Distal, Lateral Lower Leg] [2:No Photos Right, Anterior Lower Leg] [3:No Photos Right, Medial Lower Leg] Wound Location: [1:Gradually Appeared] [2:Gradually Appeared] [3:Gradually Appeared] Wounding Event: [1:Venous Leg Ulcer] [2:Venous Leg Ulcer] [3:Venous Leg Ulcer] Primary Etiology: [1:Lymphedema, Sleep Apnea,] [2:Lymphedema, Sleep Apnea,] [3:Lymphedema, Sleep Apnea,] Comorbid History: [1:Hypertension, Peripheral Venous Disease, Osteoarthritis, Confinement Anxiety 05/18/2020] [2:Hypertension, Peripheral Venous Disease, Osteoarthritis, Confinement Anxiety 05/18/2020] [3:Hypertension, Peripheral Venous Disease,  Osteoarthritis, Confinement Anxiety 05/18/2020] Date Acquired: [1:1] [2:1] [3:1] Weeks of Treatment: [1:Open] [2:Open] [3:Open] Wound Status: [1:0.6x1x0.1] [2:0.8x0.5x0.1] [3:0.8x0.4x0.1] Measurements L x W x D (cm) [1:0.471] [2:0.314] [3:0.251] A (cm) : rea [1:0.047] [2:0.031] [3:0.025] Volume (cm) : [1:58.40%] [2:11.00%] [3:23.90%] % Reduction in Area: [1:58.40%] [2:11.40%] [3:24.20%] % Reduction in Volume: [1:Full Thickness Without Exposed] [2:Full Thickness Without Exposed] [3:Full Thickness Without Exposed] Classification: [1:Support Structures Medium] [2:Support Structures Medium] [3:Support Structures Medium] Exudate Amount: [1:Serous] [2:Serous] [3:Serous] Exudate Type: [1:amber] [2:amber] [3:amber] Exudate Color: [1:Flat and Intact] [2:Flat and Intact] [3:Flat and Intact] Wound Margin: [1:Medium (34-66%)] [2:Small (1-33%)] [3:Small (1-33%)] Granulation Amount: [1:Pink]  [2:Pink] [3:Pink] Granulation Quality: [1:Medium (34-66%)] [2:Large (67-100%)] [3:Large (67-100%)] Necrotic Amount: [1:Fat Layer (Subcutaneous Tissue): Yes Fat Layer (Subcutaneous Tissue): Yes Fat Layer (Subcutaneous Tissue): Yes] Exposed Structures: [1:Fascia: No Tendon: No Muscle: No Joint: No Bone: No Small (1-33%)] [2:Fascia: No Tendon: No Muscle: No Joint: No Bone: No Small (1-33%)] [3:Fascia: No Tendon: No Muscle: No Joint: No Bone: No Small (1-33%)] Epithelialization: [1:N/A] [2:N/A] [3:N/A] Debridement: [1:N/A] [2:N/A] [3:N/A] Pain Control: [1:N/A] [2:N/A] [3:N/A] Tissue Debrided: [1:N/A] [2:N/A] [3:N/A] Level: [1:N/A] [2:N/A] [3:N/A] Debridement A (sq cm): [1:rea N/A] [2:N/A] [3:N/A] Instrument: [1:N/A] [2:N/A] [3:N/A] Bleeding: [1:N/A] [2:N/A] [3:N/A] Hemostasis A chieved: [1:N/A] [2:N/A] [3:N/A] Procedural Pain: [1:N/A] [2:N/A] [3:N/A] Post Procedural Pain: Debridement Treatment Response: N/A [2:N/A] [3:N/A] Post Debridement Measurements L x N/A [2:N/A] [3:N/A] W x D (cm) [1:N/A] [2:N/A] [3:N/A] Post Debridement Volume: (cm) [1:Compression Therapy] [2:Compression Therapy] [3:Compression Therapy] Wound  Number: 7 N/A N/A Photos: No Photos N/A N/A Right, Posterior Lower Leg N/A N/A Wound Location: Gradually Appeared N/A N/A Wounding Event: Venous Leg Ulcer N/A N/A Primary Etiology: Lymphedema, Sleep Apnea, N/A N/A Comorbid History: Hypertension, Peripheral Venous Disease, Osteoarthritis, Confinement Anxiety 05/18/2020 N/A N/A Date A cquired: 1 N/A N/A Weeks of Treatment: Healed - Epithelialized N/A N/A Wound Status: 0x0x0 N/A N/A Measurements L x W x D (cm) 0 N/A N/A A (cm) : rea 0 N/A N/A Volume (cm) : 100.00% N/A N/A % Reduction in A rea: 100.00% N/A N/A % Reduction in Volume: Full Thickness Without Exposed N/A N/A Classification: Support Structures None Present N/A N/A Exudate A mount: N/A N/A N/A Exudate Type: N/A N/A N/A Exudate Color: Flat  and Intact N/A N/A Wound Margin: None Present (0%) N/A N/A Granulation A mount: N/A N/A N/A Granulation Quality: None Present (0%) N/A N/A Necrotic A mount: Fascia: No N/A N/A Exposed Structures: Fat Layer (Subcutaneous Tissue): No Tendon: No Muscle: No Joint: No Bone: No Large (67-100%) N/A N/A Epithelialization: N/A N/A N/A Debridement: N/A N/A N/A Pain Control: N/A N/A N/A Tissue Debrided: N/A N/A N/A Level: N/A N/A N/A Debridement A (sq cm): rea N/A N/A N/A Instrument: N/A N/A N/A Bleeding: N/A N/A N/A Hemostasis A chieved: N/A N/A N/A Procedural Pain: N/A N/A N/A Post Procedural Pain: Debridement Treatment Response: N/A N/A N/A Post Debridement Measurements L x N/A N/A N/A W x D (cm) N/A N/A N/A Post Debridement Volume: (cm) N/A N/A N/A Procedures Performed: Treatment Notes Electronic Signature(s) Signed: 06/23/2020 4:37:05 PM By: [name] MD Signed: 06/23/2020 5:05:22 PM By: [name] RN Entered By: [name] on 06/23/2020 10:59:57 -------------------------------------------------------------------------------- Multi-Disciplinary Care Plan Details Patient Name: Date of Service: Erica Devoid K. 06/23/2020 9:45 A M Medical Record Number: [id number] Patient Account Number: [id number] Date of Birth/Sex: Treating RN: 05-Jun-1970 (50 y.o. [name], [name] Primary Care Recardo Linn: [name] Other Clinician: Referring Daren Doswell: Treating Wael Maestas/Extender: [name] in Treatment: 1 Active Inactive Wound/Skin Impairment Nursing Diagnoses: Impaired tissue integrity Knowledge deficit related to smoking impact on wound healing Goals: Patient will have a decrease in wound volume by X% from date: (specify in notes) Date Initiated: 06/16/2020 Target Resolution Date: 07/17/2020 Goal Status: Active Patient/caregiver will verbalize understanding of skin care regimen Date Initiated: 06/16/2020 Target  Resolution Date: 07/17/2020 Goal Status: Active Ulcer/skin breakdown will have a volume reduction of 30% by week 4 Date Initiated: 06/16/2020 Target Resolution Date: 07/17/2020 Goal Status: Active Interventions: Assess patient/caregiver ability to obtain necessary supplies Assess patient/caregiver ability to perform ulcer/skin care regimen upon admission and as needed Assess ulceration(s) every visit Notes: Electronic Signature(s) Signed: 06/23/2020 5:05:22 PM By: [name] RN Entered By: [name] on 06/23/2020 10:34:39 -------------------------------------------------------------------------------- Pain Assessment Details Patient Name: Date of Service: Erica Gong. 06/23/2020 9:45 A M Medical Record Number: [id number] Patient Account Number: [id number] Date of Birth/Sex: Treating RN: Mar 06, 1971 (49 y.o. [name], [name] Primary Care Carnita Golob: [name] Other Clinician: Referring Telissa Palmisano: Treating Brodrick Curran/Extender: [name] in Treatment: 1 Active Problems Location of Pain Severity and Description of Pain Patient Has Paino Yes Site Locations Rate the pain. Rate the pain. Current Pain Level: 3 Pain Management and Medication Current Pain Management: Electronic Signature(s) Signed: 06/23/2020 10:38:58 AM By: Sandre Kitty Signed: 06/23/2020 5:05:22 PM By: [name] RN Entered By: Sandre Kitty on 06/23/2020 10:02:29 -------------------------------------------------------------------------------- Patient/Caregiver Education Details Patient Name: Date of Service: Erica [name], Kindal K. 2/8/2022andnbsp9:45 A M  Medical Record Number: [id number] Patient Account Number: [id number] Date of Birth/Gender: Treating RN: 04/29/1971 (50 y.o. [name], [name] Primary Care Physician: [name] Other Clinician: Referring Physician: Treating Physician/Extender: [name] in Treatment:  1 Education Assessment Education Provided To: Patient Education Topics Provided Wound/Skin Impairment: Methods: Explain/Verbal Responses: State content correctly Electronic Signature(s) Signed: 06/23/2020 5:05:22 PM By: [name] RN Entered By: [name] on 06/23/2020 10:34:54 -------------------------------------------------------------------------------- Wound Assessment Details Patient Name: Date of Service: Erica Gong. 06/23/2020 9:45 A M Medical Record Number: [id number] Patient Account Number: [id number] Date of Birth/Sex: Treating RN: 11-13-1970 (50 y.o. [name], [name] Primary Care Alexarae Oliva: [name] Other Clinician: Referring Norvel Wenker: Treating Lexxi Koslow/Extender: [name] in Treatment: 1 Wound Status Wound Number: 1 Primary Venous Leg Ulcer Etiology: Wound Location: Left, Anterior Lower Leg Wound Open Wounding Event: Gradually Appeared Status: Date Acquired: 05/18/2020 Comorbid Lymphedema, Sleep Apnea, Hypertension, Peripheral Venous Weeks Of Treatment: 1 History: Disease, Osteoarthritis, Confinement Anxiety Clustered Wound: No Wound Measurements Length: (cm) 5.2 Width: (cm) 5.7 Depth: (cm) 0.1 Area: (cm) 23.279 Volume: (cm) 2.328 % Reduction in Area: 23.7% % Reduction in Volume: 23.7% Epithelialization: Small (1-33%) Tunneling: No Undermining: No Wound Description Classification: Full Thickness Without Exposed Support Structures Wound Margin: Flat and Intact Exudate Amount: Large Exudate Type: Serous Exudate Color: amber Foul Odor After Cleansing: No Slough/Fibrino Yes Wound Bed Granulation Amount: Small (1-33%) Exposed Structure Granulation Quality: Pink Fascia Exposed: No Necrotic Amount: Large (67-100%) Fat Layer (Subcutaneous Tissue) Exposed: Yes Necrotic Quality: Adherent Slough Tendon Exposed: No Muscle Exposed: No Joint Exposed: No Bone Exposed: No Treatment Notes Wound #1  (Lower Leg) Wound Laterality: Left, Anterior Cleanser Soap and Water Discharge Instruction: May shower and wash wound with dial antibacterial soap and water prior to dressing change. Wound Cleanser Discharge Instruction: Cleanse the wound with wound cleanser prior to applying a clean dressing using gauze sponges, not tissue or cotton balls. Peri-Wound Care Sween Lotion (Moisturizing lotion) Discharge Instruction: Apply moisturizing lotion as directed Triamcinolone 15 (g) Discharge Instruction: Use triamcinolone 15 (g) as directed Topical Triamcinolone Discharge Instruction: Apply Triamcinolone as directed Primary Dressing Hydrofera Blue Classic Foam, 2x2 in Discharge Instruction: Moisten with saline prior to applying to wound bed Secondary Dressing Woven Gauze Sponge, Non-Sterile 4x4 in Discharge Instruction: Apply over primary dressing as directed. ABD Pad, 5x9 Discharge Instruction: Apply over primary dressing as directed. Secured With Compression Wrap ThreePress (3 layer compression wrap) Discharge Instruction: Apply three layer compression as directed. unna Discharge Instruction: Wrap to top of leg to help hold 3 layer up Compression Stockings Add-Ons Electronic Signature(s) Signed: 06/23/2020 5:04:56 PM By: Levan Hurst RN, BSN Signed: 06/23/2020 5:05:22 PM By: [name] RN Entered By: Levan Hurst on 06/23/2020 10:26:15 -------------------------------------------------------------------------------- Wound Assessment Details Patient Name: Date of Service: Erica Gong. 06/23/2020 9:45 A M Medical Record Number: [id number] Patient Account Number: [id number] Date of Birth/Sex: Treating RN: 1971-05-14 (50 y.o. [name], [name] Primary Care Kinslee Dalpe: [name] Other Clinician: Referring Melinna Linarez: Treating Evangelene Vora/Extender: [name] in Treatment: 1 Wound Status Wound Number: 2 Primary Venous Leg  Ulcer Etiology: Wound Location: Left, Medial Lower Leg Wound Open Wounding Event: Gradually Appeared Status: Date Acquired: 05/18/2020 Comorbid Lymphedema, Sleep Apnea, Hypertension, Peripheral Venous Weeks Of Treatment: 1 History: Disease, Osteoarthritis, Confinement Anxiety Clustered Wound: No Wound Measurements Length: (cm) 0.5 Width: (cm) 1.4 Depth: (cm) 0.1 Area: (cm) 0.55 Volume: (cm) 0.055 % Reduction in Area: 63.1% % Reduction in Volume: 63.1% Epithelialization:  Small (1-33%) Tunneling: No Undermining: No Wound Description Classification: Full Thickness Without Exposed Support Structures Wound Margin: Flat and Intact Exudate Amount: Medium Exudate Type: Serous Exudate Color: amber Foul Odor After Cleansing: No Slough/Fibrino Yes Wound Bed Granulation Amount: Medium (34-66%) Exposed Structure Granulation Quality: Pink Fascia Exposed: No Necrotic Amount: Medium (34-66%) Fat Layer (Subcutaneous Tissue) Exposed: Yes Necrotic Quality: Adherent Slough Tendon Exposed: No Muscle Exposed: No Joint Exposed: No Bone Exposed: No Treatment Notes Wound #2 (Lower Leg) Wound Laterality: Left, Medial Cleanser Soap and Water Discharge Instruction: May shower and wash wound with dial antibacterial soap and water prior to dressing change. Wound Cleanser Discharge Instruction: Cleanse the wound with wound cleanser prior to applying a clean dressing using gauze sponges, not tissue or cotton balls. Peri-Wound Care Sween Lotion (Moisturizing lotion) Discharge Instruction: Apply moisturizing lotion as directed Triamcinolone 15 (g) Discharge Instruction: Use triamcinolone 15 (g) as directed Topical Triamcinolone Discharge Instruction: Apply Triamcinolone as directed Primary Dressing Hydrofera Blue Classic Foam, 2x2 in Discharge Instruction: Moisten with saline prior to applying to wound bed Secondary Dressing Woven Gauze Sponge, Non-Sterile 4x4 in Discharge Instruction:  Apply over primary dressing as directed. ABD Pad, 5x9 Discharge Instruction: Apply over primary dressing as directed. Secured With Compression Wrap ThreePress (3 layer compression wrap) Discharge Instruction: Apply three layer compression as directed. unna Discharge Instruction: Wrap to top of leg to help hold 3 layer up Compression Stockings Add-Ons Electronic Signature(s) Signed: 06/23/2020 5:04:56 PM By: Levan Hurst RN, BSN Signed: 06/23/2020 5:05:22 PM By: [name] RN Entered By: Levan Hurst on 06/23/2020 10:26:43 -------------------------------------------------------------------------------- Wound Assessment Details Patient Name: Date of Service: Erica Devoid K. 06/23/2020 9:45 A M Medical Record Number: [id number] Patient Account Number: [id number] Date of Birth/Sex: Treating RN: [birth year] (50 y.o. 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, Osteoarthritis, Confinement Anxiety Clustered Wound: No Wound Measurements Length: (cm) 1 Width: (cm) 0.5 Depth: (cm) 0.1 Area: (cm) 0.393 Volume: (cm) 0.039 % Reduction in Area: 83.3% % Reduction in Volume: 83.5% Epithelialization: Small (1-33%) Undermining: No Wound Description Classification: Full Thickness Without Exposed Support Structures Wound Margin: Flat and Intact Exudate Amount: Medium Exudate Type: Serous Exudate Color: amber Foul Odor After Cleansing: No Slough/Fibrino Yes Wound Bed Granulation Amount: Medium (34-66%) Exposed Structure Granulation Quality:  Pink Fascia Exposed: No Necrotic Amount: Medium (34-66%) Fat Layer (Subcutaneous Tissue) Exposed: Yes Necrotic Quality: Adherent Slough Tendon Exposed: No Muscle Exposed: No Joint Exposed: No Bone Exposed: No Treatment Notes Wound #3 (Lower Leg) Wound Laterality: Left, Lateral, Proximal Cleanser Soap and Water Discharge Instruction: May shower and wash wound with dial antibacterial soap and water prior to dressing change. Wound Cleanser Discharge Instruction: Cleanse the wound with wound cleanser prior to applying a clean dressing using gauze sponges, not tissue or cotton balls. Peri-Wound Care Sween Lotion (Moisturizing lotion) Discharge Instruction: Apply moisturizing lotion as directed Triamcinolone 15 (g) Discharge Instruction: Use triamcinolone 15 (g) as directed Topical Triamcinolone Discharge Instruction: Apply Triamcinolone as directed Primary Dressing Hydrofera Blue Classic Foam, 2x2 in Discharge Instruction: Moisten with saline prior to applying to wound bed Secondary Dressing Woven Gauze Sponge, Non-Sterile 4x4 in Discharge Instruction: Apply over primary dressing as directed. ABD Pad, 5x9  Discharge Instruction: Apply over primary dressing as directed. Secured With Compression Wrap ThreePress (3 layer compression wrap) Discharge Instruction: Apply three layer compression as directed. unna Discharge Instruction: Wrap to top of leg to help hold 3 layer up Compression Stockings Add-Ons Electronic Signature(s) Signed: 06/23/2020 5:04:56 PM By: Levan Hurst RN, BSN Signed: 06/23/2020 5:05:22 PM By: [name] RN Entered By: Levan Hurst on 06/23/2020 10:27:14 -------------------------------------------------------------------------------- Wound Assessment Details Patient Name: Date of Service: Erica Devoid K. 06/23/2020 9:45 A M Medical Record Number: [id number] Patient Account Number: [id number] Date of Birth/Sex: Treating RN: November 18, 1970 (50 y.o. [name], [name] Primary Care Darien Mignogna: [name] Other Clinician: Referring Holley Wirt: Treating Anjali Manzella/Extender: [name] in Treatment: 1 Wound Status Wound Number: 4 Primary Venous Leg Ulcer Etiology: Wound Location: Left, Distal, Lateral Lower Leg Wound Open Wounding Event: Gradually Appeared Status: Status: Date Acquired: 05/18/2020 Comorbid Lymphedema, Sleep Apnea, Hypertension, Peripheral Venous Weeks Of Treatment: 1 History: Disease, Osteoarthritis, Confinement Anxiety Clustered Wound: No Wound Measurements Length: (cm) 0.6 Width: (cm) 1 Depth: (cm) 0.1 Area: (cm) 0.471 Volume: (cm) 0.047 % Reduction in Area: 58.4% % Reduction in Volume: 58.4% Epithelialization: Small (1-33%) Tunneling: No Undermining: No Wound Description Classification: Full Thickness Without Exposed Support Structures Wound Margin: Flat and Intact Exudate Amount: Medium Exudate Type: Serous Exudate Color: amber Foul Odor After Cleansing: No Slough/Fibrino Yes Wound Bed Granulation Amount: Medium (34-66%) Exposed Structure Granulation Quality: Pink Fascia Exposed: No Necrotic Amount: Medium (34-66%) Fat Layer (Subcutaneous Tissue) Exposed: Yes Necrotic Quality: Adherent Slough Tendon Exposed: No Muscle Exposed: No Joint Exposed: No Bone Exposed: No Treatment Notes Wound #4 (Lower Leg) Wound Laterality: Left, Lateral, Distal Cleanser Soap and Water Discharge Instruction: May shower and wash wound with dial antibacterial soap and water prior to dressing change. Wound Cleanser Discharge Instruction: Cleanse the wound with wound cleanser prior to applying a clean dressing using gauze sponges, not tissue or cotton balls. Peri-Wound Care Sween Lotion (Moisturizing lotion) Discharge Instruction: Apply moisturizing lotion as directed Triamcinolone 15 (g) Discharge Instruction: Use triamcinolone 15 (g) as directed Topical Triamcinolone Discharge  Instruction: Apply Triamcinolone as directed Primary Dressing Hydrofera Blue Classic Foam, 2x2 in Discharge Instruction: Moisten with saline prior to applying to wound bed Secondary Dressing Woven Gauze Sponge, Non-Sterile 4x4 in Discharge Instruction: Apply over primary dressing as directed. ABD Pad, 5x9 Discharge Instruction: Apply over primary dressing as directed. Secured With Compression Wrap ThreePress (3 layer compression wrap) Discharge Instruction: Apply three layer compression as directed. unna Discharge Instruction: Wrap to top of leg to help hold 3 layer up Compression Stockings Add-Ons Electronic Signature(s) Signed: 06/23/2020 5:04:56 PM By: Levan Hurst RN, BSN Signed: 06/23/2020 5:05:22 PM By: [name] RN Entered By: Levan Hurst on 06/23/2020 10:27:32 -------------------------------------------------------------------------------- Wound Assessment Details Patient Name: Date of Service: Erica Devoid K. 06/23/2020 9:45 A M Medical Record Number: [id number] Patient Account Number: [id number] Date of Birth/Sex: Treating RN: 1971/02/17 (50 y.o. 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, Osteoarthritis, Confinement Anxiety Clustered Wound: No Wound Measurements Length: (cm) 0.8 Width: (cm) 0.5 Depth: (cm) 0.1 Area: (cm) 0.314 Volume: (cm) 0.031 % Reduction in Area: 11% % Reduction in Volume: 11.4% Epithelialization: Small (1-33%)  Tunneling: No Undermining: No Wound Description Classification: Full Thickness Without  Exposed Support Structures Wound Margin: Flat and Intact Exudate Amount: Medium Exudate Type: Serous Exudate Color: amber Foul Odor After Cleansing: No Slough/Fibrino Yes Wound Bed Granulation Amount: Small (1-33%) Exposed Structure Granulation Quality: Pink Fascia Exposed: No Necrotic Amount: Large (67-100%) Fat Layer (Subcutaneous Tissue) Exposed: Yes Necrotic Quality: Adherent Slough Tendon Exposed: No Muscle Exposed: No Joint Exposed: No Bone Exposed: No Treatment Notes Wound #5 (Lower Leg) Wound Laterality: Right, Anterior Cleanser Soap and Water Discharge Instruction: May shower and wash wound with dial antibacterial soap and water prior to dressing change. Wound Cleanser Discharge Instruction: Cleanse the wound with wound cleanser prior to applying a clean dressing using gauze sponges, not tissue or cotton balls. Peri-Wound Care Sween Lotion (Moisturizing lotion) Discharge Instruction: Apply moisturizing lotion as directed Triamcinolone 15 (g) Discharge Instruction: Use triamcinolone 15 (g) as directed Topical Triamcinolone Discharge Instruction: Apply Triamcinolone as directed Primary Dressing Hydrofera Blue Classic Foam, 2x2 in Discharge Instruction: Moisten with saline prior to applying to wound bed Secondary Dressing Woven Gauze Sponge, Non-Sterile 4x4 in Discharge Instruction: Apply over primary dressing as directed. ABD Pad, 5x9 Discharge Instruction: Apply over primary dressing as directed. Secured With Compression Wrap ThreePress (3 layer compression wrap) Discharge Instruction: Apply three layer compression as directed. unna Discharge Instruction: Wrap to top of leg to help hold 3 layer up Compression Stockings Add-Ons Electronic Signature(s) Signed: 06/23/2020 5:04:56 PM By: Levan Hurst RN, BSN Signed: 06/23/2020 5:05:22 PM By: [name] RN Entered By: Levan Hurst on 06/23/2020  10:27:59 -------------------------------------------------------------------------------- Wound Assessment Details Patient Name: Date of Service: Erica Devoid K. 06/23/2020 9:45 A M Medical Record Number: [id number] Patient Account Number: [id number] Date of Birth/Sex: Treating RN: October 14, 1970 (50 y.o. [name], [name] Primary Care Anderia Lorenzo: [name] Other Clinician: Referring Charday Capetillo: Treating Surina Storts/Extender: [name] in Treatment: 1 Wound Status Wound Number: 6 Primary Venous Leg Ulcer Etiology: Wound Location: Right, Medial Lower Leg Wound Open Wounding Event: Gradually Appeared Status: Date Acquired: 05/18/2020 Comorbid Lymphedema, Sleep Apnea, Hypertension, Peripheral Venous Weeks Of Treatment: 1 History: Disease, Osteoarthritis, Confinement Anxiety Clustered Wound: No Wound Measurements Length: (cm) 0.8 Width: (cm) 0.4 Depth: (cm) 0.1 Area: (cm) 0.251 Volume: (cm) 0.025 % Reduction in Area: 23.9% % Reduction in Volume: 24.2% Epithelialization: Small (1-33%) Tunneling: No Undermining: No Wound Description Classification: Full Thickness Without Exposed Support Structures Wound Margin: Flat and Intact Exudate Amount: Medium Exudate Type: Serous Exudate Color: amber Foul Odor After Cleansing: No Slough/Fibrino Yes Wound Bed Granulation Amount: Small (1-33%) Exposed Structure Granulation Quality: Pink Fascia Exposed: No Necrotic Amount: Large (67-100%) Fat Layer (Subcutaneous Tissue) Exposed: Yes Necrotic Quality: Adherent Slough Tendon Exposed: No Muscle Exposed: No Joint Exposed: No Bone Exposed: No Treatment Notes Wound #6 (Lower Leg) Wound Laterality: Right, Medial Cleanser Soap and Water Discharge Instruction: May shower and wash wound with dial antibacterial soap and water prior to dressing change. Wound Cleanser Discharge Instruction: Cleanse the wound with wound cleanser prior to applying a clean  dressing using gauze sponges, not tissue or cotton balls. Peri-Wound Care Sween Lotion (Moisturizing lotion) Discharge Instruction: Apply moisturizing lotion as directed Triamcinolone 15 (g) Discharge Instruction: Use triamcinolone 15 (g) as directed Topical Triamcinolone Discharge Instruction: Apply Triamcinolone as directed Primary Dressing Hydrofera Blue Classic Foam, 2x2 in Discharge Instruction: Moisten with saline prior to applying to wound bed Secondary Dressing Woven Gauze Sponge, Non-Sterile 4x4 in Discharge Instruction: Apply over primary dressing as directed. ABD Pad, 5x9 Discharge  Instruction: Apply over primary dressing as directed. Secured With Compression Wrap ThreePress (3 layer compression wrap) Discharge Instruction: Apply three layer compression as directed. unna Discharge Instruction: Wrap to top of leg to help hold 3 layer up Compression Stockings Add-Ons Electronic Signature(s) Signed: 06/23/2020 5:04:56 PM By: Levan Hurst RN, BSN Signed: 06/23/2020 5:05:22 PM By: [name] RN Entered By: Levan Hurst on 06/23/2020 10:28:21 -------------------------------------------------------------------------------- Wound Assessment Details Patient Name: Date of Service: Erica Devoid K. 06/23/2020 9:45 A M Medical Record Number: [id number] Patient Account Number: [id number] Date of Birth/Sex: Treating RN: 09/14/1970 (50 y.o. [name], [name] Primary Care Jeily Guthridge: [name] Other Clinician: Referring Drema Eddington: Treating Liyah Higham/Extender: [name] in Treatment: 1 Wound Status Wound Number: 7 Primary Venous Leg Ulcer Etiology: Wound Location: Right, Posterior Lower Leg Wound Healed - Epithelialized Wounding Event: Gradually Appeared Status: Date Acquired: 05/18/2020 Comorbid Lymphedema, Sleep Apnea, Hypertension, Peripheral Venous Weeks Of Treatment: 1 History: Disease, Osteoarthritis, Confinement  Anxiety Clustered Wound: No Wound Measurements Length: (cm) Width: (cm) Depth: (cm) Area: (cm) Volume: (cm) 0 % Reduction in Area: 100% 0 % Reduction in Volume: 100% 0 Epithelialization: Large (67-100%) 0 Tunneling: No 0 Undermining: No Wound Description Classification: Full Thickness Without Exposed Support Structures Wound Margin: Flat and Intact Exudate Amount: None Present Foul Odor After Cleansing: No Slough/Fibrino No Wound Bed Granulation Amount: None Present (0%) Exposed Structure Necrotic Amount: None Present (0%) Fascia Exposed: No Fat Layer (Subcutaneous Tissue) Exposed: No Tendon Exposed: No Muscle Exposed: No Joint Exposed: No Bone Exposed: No Electronic Signature(s) Signed: 06/23/2020 5:04:56 PM By: Levan Hurst RN, BSN Signed: 06/23/2020 5:05:22 PM By: [name] RN Entered By: Levan Hurst on 06/23/2020 10:28:45 -------------------------------------------------------------------------------- Vitals Details Patient Name: Date of Service: Erica Devoid K. 06/23/2020 9:45 A M Medical Record Number: [id number] Patient Account Number: [id number] Date of Birth/Sex: Treating RN: 12-06-1970 (50 y.o. [name], [name] Primary Care Markelle Najarian: [name] Other Clinician: Referring Karlen Barbar: Treating Mabeline Varas/Extender: [name] in Treatment: 1 Vital Signs Time Taken: 10:01 Temperature (F): 98.2 Height (in): 62 Pulse (bpm): 75 Weight (lbs): 304 Respiratory Rate (breaths/min): 18 Body Mass Index (BMI): 55.6 Blood Pressure (mmHg): 115/68 Reference Range: 80 - 120 mg / dl Electronic Signature(s) Signed: 06/23/2020 10:38:58 AM By: Sandre Kitty Entered By: Sandre Kitty on 06/23/2020 10:02:16

## 2020-06-23 NOTE — Progress Notes (Signed)
[name], [name] ([id number]) Visit Report for 06/23/2020 Debridement Details Patient Name: Date of Service: [name], [name] 06/23/2020 9:45 A M Medical Record Number: [id number] Patient Account Number: [id number] Date of Birth/Sex: Treating RN: [birth year] (50 y.o. [name], [name] Primary Care Provider: [name] Other Clinician: Referring Provider: Treating Provider/Extender: [name] in Treatment: 1 Debridement Performed for Assessment: Wound #1 Left,Anterior Lower Leg Performed By: Physician Ricard Dillon., MD Debridement Type: Debridement Severity of Tissue Pre Debridement: Fat layer exposed Level of Consciousness (Pre-procedure): Awake and Alert Pre-procedure Verification/Time Out Yes - 10:38 Taken: Start Time: 10:38 Pain Control: Lidocaine T Area Debrided (L x W): otal 5.2 (cm) x 5.7 (cm) = 29.64 (cm) Tissue and other material debrided: Viable, Non-Viable, Slough, Subcutaneous, Skin: Dermis , Skin: Epidermis, Slough Level: Skin/Subcutaneous Tissue Debridement Description: Excisional Instrument: Curette Bleeding: Minimum Hemostasis Achieved: Pressure End Time: 10:39 Procedural Pain: 0 Post Procedural Pain: 0 Response to Treatment: Procedure was tolerated well Level of Consciousness (Post- Awake and Alert procedure): Post Debridement Measurements of Total Wound Length: (cm) 5.2 Width: (cm) 5.7 Depth: (cm) 0.1 Volume: (cm) 2.328 Character of Wound/Ulcer Post Debridement: Improved Severity of Tissue Post Debridement: Fat layer exposed Post Procedure Diagnosis Same as Pre-procedure Electronic Signature(s) Signed: 06/23/2020 4:37:05 PM By: [name] MD Signed: 06/23/2020 5:05:22 PM By: [name] RN Entered By: [name] on 06/23/2020 11:00:44 -------------------------------------------------------------------------------- HPI Details Patient Name: Date of Service: Erica Devoid K. 06/23/2020 9:45 A M Medical  Record Number: [id number] Patient Account Number: [id number] Date of Birth/Sex: Treating RN: 1970-11-18 (50 y.o. [name], [name] Primary Care Provider: [name] Other Clinician: Referring Provider: Treating Provider/Extender: [name] in Treatment: 1 History of Present Illness HPI Description: ADMISSION 06/16/2020 This is a 50 year old complicated woman. She states she has had chronic swelling and draining from her legs for many many years. She has been prescribed compression stockings in the past but does not wear them because they are too tight. She has been dealing with open areas on the left and the right leg for several weeks now. There is drainage coming out of the area on the left leg. She saw her primary earlier this month and was put on Augmentin. A culture was apparently done remotely showing MSSA. More recently she has been of applying Neosporin and Bactroban. She has 4 open areas on the left and 3 on the right. Patient also has a complicated medical and dermatologic history. She has a history of cutaneous polyarteritis nodosa apparently diagnosed by a biopsies of her skin in the lower legs. She was on immunosuppressive's with Humira for a while but not currently. She also has psoriasis. Was followed by dermatology at [facility] healthcare Dr. Romona Curls. Her notes were very helpful to review. Also has morbid obesity continuing to continuing to contribute to both venous and lymphatic issues. In the past she is just simply been unable to tolerate compression stockings. She is onSkyriza For her psoriasis. She was hospitalized in March of this year for cellulitis of both legs with sepsis. Past medical history includes psoriasis, hyperlipidemia, chronic lower extremity edema as described, cutaneous polyarteritis nodosa, sleep apnea fibromyalgia, osteoarthritis of both knees Patient's ABIs in our clinic were 0.96 on the right and 1.05 on the left  she had venous reflux studies in May 2018. At that point on the right she had normal reflux times noted in the greater saphenous vein with no evidence of DVT No indirect evidence of  obstruction proximal to the inguinal ligament. No evidence . of chronic venous insufficiency bilaterally and no evidence of a DVT 2/8; is a patient I admitted to the clinic last week. I thought she had venous insufficiency ulcers. Perhaps mostly lymphedema. She did tolerate the 3 layer compression well smaller. Most problematic areas on the left anterior mid tibia. There there is wounds right across from medial to lateral. Also a band of chronic venous stasis.. We used Hydrofera Blue on the wounds on the right leg wounds are not as dramatic. Small open areas anteriorly and medially. As noted she tolerated 3 layer compression well Electronic Signature(s) Signed: 06/23/2020 4:37:05 PM By: [name] MD Entered By: [name] on 06/23/2020 11:02:36 -------------------------------------------------------------------------------- Physical Exam Details Patient Name: Date of Service: Erica Devoid K. 06/23/2020 9:45 A M Medical Record Number: [id number] Patient Account Number: [id number] Date of Birth/Sex: Treating RN: 1970-08-19 (51 y.o. [name], [name] Primary Care Provider: [name] Other Clinician: Referring Provider: Treating Provider/Extender: [name] in Treatment: 1 Constitutional Sitting or standing Blood Pressure is within target range for patient.. Pulse regular and within target range for patient.Marland Kitchen Respirations regular, non-labored and within target range.. Temperature is normal and within the target range for the patient.Marland Kitchen Appears in no distress. Cardiovascular Dorsalis pedis pulses are palpable bilateral. Notes Wound exam The area on the left looks somewhat better. There is less drainage. Less erythema. She still has a band of what looks to be chronic  stasis dermatitis although this is not tender. The wounds under illumination have a tightly adherent necrotic surface. I debrided with a #3 curette to all of these small areas hemostasis with direct pressure there was quite a bit of bleeding Punched-out areas anteriorly and medially on the right Electronic Signature(s) Signed: 06/23/2020 4:37:05 PM By: [name] MD Entered By: [name] on 06/23/2020 11:03:55 -------------------------------------------------------------------------------- Physician Orders Details Patient Name: Date of Service: Erica Devoid K. 06/23/2020 9:45 A M Medical Record Number: [id number] Patient Account Number: [id number] Date of Birth/Sex: Treating RN: 1971/05/06 (50 y.o. [name], [name] Primary Care Provider: [name] Other Clinician: Referring Provider: Treating Provider/Extender: [name] in Treatment: 1 Verbal / Phone Orders: No Diagnosis Coding Follow-up Appointments Return Appointment in 1 week. Bathing/ Shower/ Hygiene May shower with protection but do not get wound dressing(s) wet. - May use cast protectors...you can find those at [facility] or [facility] Edema Control - Lymphedema / SCD / Other Elevate legs to the level of the heart or above for 30 minutes daily and/or when sitting, a frequency of: Avoid standing for long periods of time. Wound Treatment Wound #1 - Lower Leg Wound Laterality: Left, Anterior Cleanser: Soap and Water 1 x Per Week/15 Days Discharge Instructions: May shower and wash wound with dial antibacterial soap and water prior to dressing change. Cleanser: Wound Cleanser 1 x Per Week/15 Days Discharge Instructions: Cleanse the wound with wound cleanser prior to applying a clean dressing using gauze sponges, not tissue or cotton balls. Peri-Wound Care: Sween Lotion (Moisturizing lotion) 1 x Per Week/15 Days Discharge Instructions: Apply moisturizing lotion as directed Peri-Wound Care:  Triamcinolone 15 (g) 1 x Per Week/15 Days Discharge Instructions: Use triamcinolone 15 (g) as directed Topical: Triamcinolone 1 x Per Week/15 Days Discharge Instructions: Apply Triamcinolone as directed Prim Dressing: Hydrofera Blue Classic Foam, 2x2 in 1 x Per Week/15 Days ary Discharge Instructions: Moisten with saline prior to applying to wound bed Secondary Dressing: Woven Gauze Sponge, Non-Sterile 4x4  in 1 x Per Week/15 Days Discharge Instructions: Apply over primary dressing as directed. Secondary Dressing: ABD Pad, 5x9 1 x Per Week/15 Days Discharge Instructions: Apply over primary dressing as directed. Compression Wrap: ThreePress (3 layer compression wrap) 1 x Per Week/15 Days Discharge Instructions: Apply three layer compression as directed. Compression Wrap: unna 1 x Per Week/15 Days Discharge Instructions: Wrap to top of leg to help hold 3 layer up Wound #2 - Lower Leg Wound Laterality: Left, Medial Cleanser: Soap and Water 1 x Per Week/15 Days Discharge Instructions: May shower and wash wound with dial antibacterial soap and water prior to dressing change. Cleanser: Wound Cleanser 1 x Per Week/15 Days Discharge Instructions: Cleanse the wound with wound cleanser prior to applying a clean dressing using gauze sponges, not tissue or cotton balls. Peri-Wound Care: Sween Lotion (Moisturizing lotion) 1 x Per Week/15 Days Discharge Instructions: Apply moisturizing lotion as directed Peri-Wound Care: Triamcinolone 15 (g) 1 x Per Week/15 Days Discharge Instructions: Use triamcinolone 15 (g) as directed Topical: Triamcinolone 1 x Per Week/15 Days Discharge Instructions: Apply Triamcinolone as directed Prim Dressing: Hydrofera Blue Classic Foam, 2x2 in 1 x Per Week/15 Days ary Discharge Instructions: Moisten with saline prior to applying to wound bed Secondary Dressing: Woven Gauze Sponge, Non-Sterile 4x4 in 1 x Per Week/15 Days Discharge Instructions: Apply over primary dressing as  directed. Secondary Dressing: ABD Pad, 5x9 1 x Per Week/15 Days Discharge Instructions: Apply over primary dressing as directed. Compression Wrap: ThreePress (3 layer compression wrap) 1 x Per Week/15 Days Discharge Instructions: Apply three layer compression as directed. Compression Wrap: unna 1 x Per Week/15 Days Discharge Instructions: Wrap to top of leg to help hold 3 layer up Wound #3 - Lower Leg Wound Laterality: Left, Lateral, Proximal Cleanser: Soap and Water 1 x Per Week/15 Days Discharge Instructions: May shower and wash wound with dial antibacterial soap and water prior to dressing change. Cleanser: Wound Cleanser 1 x Per Week/15 Days Discharge Instructions: Cleanse the wound with wound cleanser prior to applying a clean dressing using gauze sponges, not tissue or cotton balls. Peri-Wound Care: Sween Lotion (Moisturizing lotion) 1 x Per Week/15 Days Discharge Instructions: Apply moisturizing lotion as directed Peri-Wound Care: Triamcinolone 15 (g) 1 x Per Week/15 Days Discharge Instructions: Use triamcinolone 15 (g) as directed Topical: Triamcinolone 1 x Per Week/15 Days Discharge Instructions: Apply Triamcinolone as directed Prim Dressing: Hydrofera Blue Classic Foam, 2x2 in 1 x Per Week/15 Days ary Discharge Instructions: Moisten with saline prior to applying to wound bed Secondary Dressing: Woven Gauze Sponge, Non-Sterile 4x4 in 1 x Per Week/15 Days Discharge Instructions: Apply over primary dressing as directed. Secondary Dressing: ABD Pad, 5x9 1 x Per Week/15 Days Discharge Instructions: Apply over primary dressing as directed. Compression Wrap: ThreePress (3 layer compression wrap) 1 x Per Week/15 Days Discharge Instructions: Apply three layer compression as directed. Compression Wrap: unna 1 x Per Week/15 Days Discharge Instructions: Wrap to top of leg to help hold 3 layer up Wound #4 - Lower Leg Wound Laterality: Left, Lateral, Distal Cleanser: Soap and Water 1 x Per  Week/15 Days Discharge Instructions: May shower and wash wound with dial antibacterial soap and water prior to dressing change. Cleanser: Wound Cleanser 1 x Per Week/15 Days Discharge Instructions: Cleanse the wound with wound cleanser prior to applying a clean dressing using gauze sponges, not tissue or cotton balls. Peri-Wound Care: Sween Lotion (Moisturizing lotion) 1 x Per Week/15 Days Discharge Instructions: Apply moisturizing lotion as directed Peri-Wound Care:  Triamcinolone 15 (g) 1 x Per Week/15 Days Discharge Instructions: Use triamcinolone 15 (g) as directed Topical: Triamcinolone 1 x Per Week/15 Days Discharge Instructions: Apply Triamcinolone as directed Prim Dressing: Hydrofera Blue Classic Foam, 2x2 in 1 x Per Week/15 Days ary Discharge Instructions: Moisten with saline prior to applying to wound bed Secondary Dressing: Woven Gauze Sponge, Non-Sterile 4x4 in 1 x Per Week/15 Days Discharge Instructions: Apply over primary dressing as directed. Secondary Dressing: ABD Pad, 5x9 1 x Per Week/15 Days Discharge Instructions: Apply over primary dressing as directed. Compression Wrap: ThreePress (3 layer compression wrap) 1 x Per Week/15 Days Discharge Instructions: Apply three layer compression as directed. Compression Wrap: unna 1 x Per Week/15 Days Discharge Instructions: Wrap to top of leg to help hold 3 layer up Wound #5 - Lower Leg Wound Laterality: Right, Anterior Cleanser: Soap and Water 1 x Per Week/15 Days Discharge Instructions: May shower and wash wound with dial antibacterial soap and water prior to dressing change. Cleanser: Wound Cleanser 1 x Per Week/15 Days Discharge Instructions: Cleanse the wound with wound cleanser prior to applying a clean dressing using gauze sponges, not tissue or cotton balls. Peri-Wound Care: Sween Lotion (Moisturizing lotion) 1 x Per Week/15 Days Discharge Instructions: Apply moisturizing lotion as directed Peri-Wound Care: Triamcinolone 15  (g) 1 x Per Week/15 Days Discharge Instructions: Use triamcinolone 15 (g) as directed Topical: Triamcinolone 1 x Per Week/15 Days Discharge Instructions: Apply Triamcinolone as directed Prim Dressing: Hydrofera Blue Classic Foam, 2x2 in 1 x Per Week/15 Days ary Discharge Instructions: Moisten with saline prior to applying to wound bed Secondary Dressing: Woven Gauze Sponge, Non-Sterile 4x4 in 1 x Per Week/15 Days Discharge Instructions: Apply over primary dressing as directed. Secondary Dressing: ABD Pad, 5x9 1 x Per Week/15 Days Discharge Instructions: Apply over primary dressing as directed. Compression Wrap: ThreePress (3 layer compression wrap) 1 x Per Week/15 Days Discharge Instructions: Apply three layer compression as directed. Compression Wrap: unna 1 x Per Week/15 Days Discharge Instructions: Wrap to top of leg to help hold 3 layer up Wound #6 - Lower Leg Wound Laterality: Right, Medial Cleanser: Soap and Water 1 x Per Week/15 Days Discharge Instructions: May shower and wash wound with dial antibacterial soap and water prior to dressing change. Cleanser: Wound Cleanser 1 x Per Week/15 Days Discharge Instructions: Cleanse the wound with wound cleanser prior to applying a clean dressing using gauze sponges, not tissue or cotton balls. Peri-Wound Care: Sween Lotion (Moisturizing lotion) 1 x Per Week/15 Days Discharge Instructions: Apply moisturizing lotion as directed Peri-Wound Care: Triamcinolone 15 (g) 1 x Per Week/15 Days Discharge Instructions: Use triamcinolone 15 (g) as directed Topical: Triamcinolone 1 x Per Week/15 Days Discharge Instructions: Apply Triamcinolone as directed Prim Dressing: Hydrofera Blue Classic Foam, 2x2 in 1 x Per Week/15 Days ary Discharge Instructions: Moisten with saline prior to applying to wound bed Secondary Dressing: Woven Gauze Sponge, Non-Sterile 4x4 in 1 x Per Week/15 Days Discharge Instructions: Apply over primary dressing as  directed. Secondary Dressing: ABD Pad, 5x9 1 x Per Week/15 Days Discharge Instructions: Apply over primary dressing as directed. Compression Wrap: ThreePress (3 layer compression wrap) 1 x Per Week/15 Days Discharge Instructions: Apply three layer compression as directed. Compression Wrap: unna 1 x Per Week/15 Days Discharge Instructions: Wrap to top of leg to help hold 3 layer up Electronic Signature(s) Signed: 06/23/2020 4:37:05 PM By: [name] MD Signed: 06/23/2020 5:05:22 PM By: [name] RN Entered By: [name] on 06/23/2020 10:46:31 --------------------------------------------------------------------------------  Problem List Details Patient Name: Date of Service: [name], [name] 06/23/2020 9:45 A M Medical Record Number: [id number] Patient Account Number: [id number] Date of Birth/Sex: Treating RN: Apr 19, 1971 (50 y.o. [name], [name] Primary Care Provider: [name] Other Clinician: Referring Provider: Treating Provider/Extender: [name] in Treatment: 1 Active Problems ICD-10 Encounter Code Description Active Date MDM Diagnosis I87.333 Chronic venous hypertension (idiopathic) with ulcer and inflammation of 06/16/2020 No Yes bilateral lower extremity I89.0 Lymphedema, not elsewhere classified 06/16/2020 No Yes M30.0 Polyarteritis nodosa 06/16/2020 No Yes L97.821 Non-pressure chronic ulcer of other part of left lower leg limited to breakdown 06/16/2020 No Yes of skin L97.811 Non-pressure chronic ulcer of other part of right lower leg limited to breakdown 06/16/2020 No Yes of skin Inactive Problems Resolved Problems Electronic Signature(s) Signed: 06/23/2020 4:37:05 PM By: [name] MD Entered By: [name] on 06/23/2020 10:58:43 -------------------------------------------------------------------------------- Progress Note Details Patient Name: Date of Service: Erica Devoid K. 06/23/2020 9:45 A M Medical  Record Number: [id number] Patient Account Number: [id number] Date of Birth/Sex: Treating RN: 15-Apr-1971 (50 y.o. [name], [name] Primary Care Provider: [name] Other Clinician: Referring Provider: Treating Provider/Extender: [name] in Treatment: 1 Subjective History of Present Illness (HPI) ADMISSION 06/16/2020 This is a 50 year old complicated woman. She states she has had chronic swelling and draining from her legs for many many years. She has been prescribed compression stockings in the past but does not wear them because they are too tight. She has been dealing with open areas on the left and the right leg for several weeks now. There is drainage coming out of the area on the left leg. She saw her primary earlier this month and was put on Augmentin. A culture was apparently done remotely showing MSSA. More recently she has been of applying Neosporin and Bactroban. She has 4 open areas on the left and 3 on the right. Patient also has a complicated medical and dermatologic history. She has a history of cutaneous polyarteritis nodosa apparently diagnosed by a biopsies of her skin in the lower legs. She was on immunosuppressive's with Humira for a while but not currently. She also has psoriasis. Was followed by dermatology at [facility] healthcare Dr. Romona Curls. Her notes were very helpful to review. Also has morbid obesity continuing to continuing to contribute to both venous and lymphatic issues. In the past she is just simply been unable to tolerate compression stockings. She is onSkyriza For her psoriasis. She was hospitalized in March of this year for cellulitis of both legs with sepsis. Past medical history includes psoriasis, hyperlipidemia, chronic lower extremity edema as described, cutaneous polyarteritis nodosa, sleep apnea fibromyalgia, osteoarthritis of both knees Patient's ABIs in our clinic were 0.96 on the right and 1.05 on the  left she had venous reflux studies in May 2018. At that point on the right she had normal reflux times noted in the greater saphenous vein with no evidence of DVT No indirect evidence of obstruction proximal to the inguinal ligament. No evidence . of chronic venous insufficiency bilaterally and no evidence of a DVT 2/8; is a patient I admitted to the clinic last week. I thought she had venous insufficiency ulcers. Perhaps mostly lymphedema. She did tolerate the 3 layer compression well smaller. Most problematic areas on the left anterior mid tibia. There there is wounds right across from medial to lateral. Also a band of chronic venous stasis.. We used Hydrofera Blue on the wounds on the  right leg wounds are not as dramatic. Small open areas anteriorly and medially. As noted she tolerated 3 layer compression well Objective Constitutional Sitting or standing Blood Pressure is within target range for patient.. Pulse regular and within target range for patient.Marland Kitchen Respirations regular, non-labored and within target range.. Temperature is normal and within the target range for the patient.Marland Kitchen Appears in no distress. Vitals Time Taken: 10:01 AM, Height: 62 in, Weight: 304 lbs, BMI: 55.6, Temperature: 98.2 F, Pulse: 75 bpm, Respiratory Rate: 18 breaths/min, Blood Pressure: 115/68 mmHg. Cardiovascular Dorsalis pedis pulses are palpable bilateral. General Notes: Wound exam ooThe area on the left looks somewhat better. There is less drainage. Less erythema. She still has a band of what looks to be chronic stasis dermatitis although this is not tender. The wounds under illumination have a tightly adherent necrotic surface. I debrided with a #3 curette to all of these small areas hemostasis with direct pressure there was quite a bit of bleeding ooPunched-out areas anteriorly and medially on the right Integumentary (Hair, Skin) Wound #1 status is Open. Original cause of wound was Gradually Appeared. The  wound is located on the Left,Anterior Lower Leg. The wound measures 5.2cm length x 5.7cm width x 0.1cm depth; 23.279cm^2 area and 2.328cm^3 volume. There is Fat Layer (Subcutaneous Tissue) exposed. There is no tunneling or undermining noted. There is a large amount of serous drainage noted. The wound margin is flat and intact. There is small (1-33%) pink granulation within the wound bed. There is a large (67-100%) amount of necrotic tissue within the wound bed including Adherent Slough. Wound #2 status is Open. Original cause of wound was Gradually Appeared. The wound is located on the Left,Medial Lower Leg. The wound measures 0.5cm length x 1.4cm width x 0.1cm depth; 0.55cm^2 area and 0.055cm^3 volume. There is Fat Layer (Subcutaneous Tissue) exposed. There is no tunneling or undermining noted. There is a medium amount of serous drainage noted. The wound margin is flat and intact. There is medium (34-66%) pink granulation within the wound bed. There is a medium (34-66%) amount of necrotic tissue within the wound bed including Adherent Slough. Wound #3 status is Open. Original cause of wound was Gradually Appeared. The wound is located on the Left,Proximal,Lateral Lower Leg. The wound measures 1cm length x 0.5cm width x 0.1cm depth; 0.393cm^2 area and 0.039cm^3 volume. There is Fat Layer (Subcutaneous Tissue) exposed. There is no undermining noted. There is a medium amount of serous drainage noted. The wound margin is flat and intact. There is medium (34-66%) pink granulation within the wound bed. There is a medium (34-66%) amount of necrotic tissue within the wound bed including Adherent Slough. Wound #4 status is Open. Original cause of wound was Gradually Appeared. The wound is located on the Left,Distal,Lateral Lower Leg. The wound measures 0.6cm length x 1cm width x 0.1cm depth; 0.471cm^2 area and 0.047cm^3 volume. There is Fat Layer (Subcutaneous Tissue) exposed. There is no tunneling  or undermining noted. There is a medium amount of serous drainage noted. The wound margin is flat and intact. There is medium (34-66%) pink granulation within the wound bed. There is a medium (34-66%) amount of necrotic tissue within the wound bed including Adherent Slough. Wound #5 status is Open. Original cause of wound was Gradually Appeared. The wound is located on the Right,Anterior Lower Leg. The wound measures 0.8cm length x 0.5cm width x 0.1cm depth; 0.314cm^2 area and 0.031cm^3 volume. There is Fat Layer (Subcutaneous Tissue) exposed. There is no tunneling or undermining  noted. There is a medium amount of serous drainage noted. The wound margin is flat and intact. There is small (1-33%) pink granulation within the wound bed. There is a large (67-100%) amount of necrotic tissue within the wound bed including Adherent Slough. Wound #6 status is Open. Original cause of wound was Gradually Appeared. The wound is located on the Right,Medial Lower Leg. The wound measures 0.8cm length x 0.4cm width x 0.1cm depth; 0.251cm^2 area and 0.025cm^3 volume. There is Fat Layer (Subcutaneous Tissue) exposed. There is no tunneling or undermining noted. There is a medium amount of serous drainage noted. The wound margin is flat and intact. There is small (1-33%) pink granulation within the wound bed. There is a large (67-100%) amount of necrotic tissue within the wound bed including Adherent Slough. Wound #7 status is Healed - Epithelialized. Original cause of wound was Gradually Appeared. The wound is located on the Right,Posterior Lower Leg. The wound measures 0cm length x 0cm width x 0cm depth; 0cm^2 area and 0cm^3 volume. There is no tunneling or undermining noted. There is a none present amount of drainage noted. The wound margin is flat and intact. There is no granulation within the wound bed. There is no necrotic tissue within the wound bed. 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 There was a Excisional Skin/Subcutaneous Tissue Debridement with a total area of 29.64 sq cm performed by Ricard Dillon., MD. With the following instrument(s): Curette to remove Viable and Non-Viable tissue/material. Material removed includes Subcutaneous Tissue, Slough, Skin: Dermis, and Skin: Epidermis after achieving pain control using Lidocaine. No specimens were taken. A time out was conducted at 10:38, prior to the start of the procedure. A Minimum amount of bleeding was controlled with Pressure. The procedure was tolerated well with a pain level of 0 throughout and a pain level of 0 following the procedure. Post Debridement Measurements: 5.2cm length x 5.7cm width x 0.1cm depth; 2.328cm^3 volume. Character of Wound/Ulcer Post Debridement is improved. Severity of Tissue Post Debridement is: Fat layer exposed. Post procedure Diagnosis Wound #1: Same as Pre-Procedure Pre-procedure diagnosis of Wound #1 is a Venous Leg Ulcer located on the Left,Anterior Lower Leg . There was a Three Layer Compression Therapy Procedure by [name], RN. Post procedure Diagnosis Wound #1: Same as Pre-Procedure Wound #2 Pre-procedure diagnosis of Wound #2 is a Venous Leg Ulcer located on the Left,Medial Lower Leg . There was a Three Layer Compression Therapy Procedure by [name], RN. Post procedure Diagnosis Wound #2: Same as Pre-Procedure Wound #3 Pre-procedure diagnosis of Wound #3 is a Venous Leg Ulcer located on  the Left,Proximal,Lateral Lower Leg . There was a Three Layer Compression Therapy Procedure by [name], RN. Post procedure Diagnosis Wound #3: Same as Pre-Procedure Wound #4 Pre-procedure diagnosis of Wound #4 is a Venous Leg Ulcer located on the Left,Distal,Lateral Lower Leg . There was a Three Layer Compression Therapy Procedure by [name], RN. Post procedure Diagnosis Wound #4: Same as Pre-Procedure Wound #5 Pre-procedure diagnosis of Wound #5 is a Venous Leg Ulcer located on the  Right,Anterior Lower Leg . There was a Three Layer Compression Therapy Procedure by [name], RN. Post procedure Diagnosis Wound #5: Same as Pre-Procedure Wound #6 Pre-procedure diagnosis of Wound #6 is a Venous Leg Ulcer located on the Right,Medial Lower Leg . There was a Three Layer Compression Therapy Procedure by [name], RN. Post procedure Diagnosis Wound #6: Same as Pre-Procedure Plan Follow-up Appointments: Return Appointment in 1 week. Bathing/ Shower/ Hygiene: May shower with protection but do not get wound dressing(s) wet. - May use cast protectors...you can find those at [facility] or [facility] Edema Control - Lymphedema / SCD / Other: Elevate legs to the level of the heart or above for 30 minutes daily and/or when sitting, a frequency of: Avoid standing for long periods of time. WOUND #1: - Lower Leg Wound Laterality: Left, Anterior Cleanser: Soap and Water 1 x Per Week/15 Days Discharge Instructions: May shower and wash wound with dial antibacterial soap and water prior to dressing change. Cleanser: Wound Cleanser 1 x Per Week/15 Days Discharge Instructions: Cleanse the wound with wound cleanser prior to applying a clean dressing using gauze sponges, not tissue or cotton balls. Peri-Wound Care: Sween Lotion (Moisturizing lotion) 1 x Per Week/15 Days Discharge Instructions: Apply moisturizing lotion as directed Peri-Wound Care: Triamcinolone 15 (g) 1 x Per Week/15  Days Discharge Instructions: Use triamcinolone 15 (g) as directed Topical: Triamcinolone 1 x Per Week/15 Days Discharge Instructions: Apply Triamcinolone as directed Prim Dressing: Hydrofera Blue Classic Foam, 2x2 in 1 x Per Week/15 Days ary Discharge Instructions: Moisten with saline prior to applying to wound bed Secondary Dressing: Woven Gauze Sponge, Non-Sterile 4x4 in 1 x Per Week/15 Days Discharge Instructions: Apply over primary dressing as directed. Secondary Dressing: ABD Pad, 5x9 1 x Per Week/15 Days Discharge Instructions: Apply over primary dressing as directed. Com pression Wrap: ThreePress (3 layer compression wrap) 1 x Per Week/15 Days Discharge Instructions: Apply three layer compression as directed. Com pression Wrap: unna 1 x Per Week/15 Days Discharge Instructions: Wrap to top of leg to help hold 3 layer up WOUND #2: - Lower Leg Wound Laterality: Left, Medial Cleanser: Soap and Water 1 x Per Week/15 Days Discharge Instructions: May shower and wash wound with dial antibacterial soap and water prior to dressing change. Cleanser: Wound Cleanser 1 x Per Week/15 Days Discharge Instructions: Cleanse the wound with wound cleanser prior to applying a clean dressing using gauze sponges, not tissue or cotton balls. Peri-Wound Care: Sween Lotion (Moisturizing lotion) 1 x Per Week/15 Days Discharge Instructions: Apply moisturizing lotion as directed Peri-Wound Care: Triamcinolone 15 (g) 1 x Per Week/15 Days Discharge Instructions: Use triamcinolone 15 (g) as directed Topical: Triamcinolone 1 x Per Week/15 Days Discharge Instructions: Apply Triamcinolone as directed Prim Dressing: Hydrofera Blue Classic Foam, 2x2 in 1 x Per Week/15 Days ary Discharge Instructions: Moisten with saline prior to applying to wound bed Secondary Dressing: Woven Gauze Sponge, Non-Sterile 4x4 in 1 x Per Week/15 Days Discharge Instructions: Apply over primary dressing as directed. Secondary Dressing: ABD  Pad, 5x9 1 x Per Week/15 Days Discharge Instructions: Apply over primary dressing as directed. Com pression Wrap: ThreePress (3 layer compression wrap) 1 x Per Week/15 Days Discharge Instructions: Apply three layer compression as directed. Com pression Wrap: unna 1 x Per Week/15 Days Discharge Instructions: Wrap to top of leg to help hold 3 layer up WOUND #3: - Lower Leg Wound Laterality: Left, Lateral, Proximal Cleanser: Soap and Water 1 x Per Week/15 Days Discharge Instructions: May shower and  wash wound with dial antibacterial soap and water prior to dressing change. Cleanser: Wound Cleanser 1 x Per Week/15 Days Discharge Instructions: Cleanse the wound with wound cleanser prior to applying a clean dressing using gauze sponges, not tissue or cotton balls. Peri-Wound Care: Sween Lotion (Moisturizing lotion) 1 x Per Week/15 Days Discharge Instructions: Apply moisturizing lotion as directed Peri-Wound Care: Triamcinolone 15 (g) 1 x Per Week/15 Days Discharge Instructions: Use triamcinolone 15 (g) as directed Topical: Triamcinolone 1 x Per Week/15 Days Discharge Instructions: Apply Triamcinolone as directed Prim Dressing: Hydrofera Blue Classic Foam, 2x2 in 1 x Per Week/15 Days ary Discharge Instructions: Moisten with saline prior to applying to wound bed Secondary Dressing: Woven Gauze Sponge, Non-Sterile 4x4 in 1 x Per Week/15 Days Discharge Instructions: Apply over primary dressing as directed. Secondary Dressing: ABD Pad, 5x9 1 x Per Week/15 Days Discharge Instructions: Apply over primary dressing as directed. Com pression Wrap: ThreePress (3 layer compression wrap) 1 x Per Week/15 Days Discharge Instructions: Apply three layer compression as directed. Com pression Wrap: unna 1 x Per Week/15 Days Discharge Instructions: Wrap to top of leg to help hold 3 layer up WOUND #4: - Lower Leg Wound Laterality: Left, Lateral, Distal Cleanser: Soap and Water 1 x Per Week/15 Days Discharge  Instructions: May shower and wash wound with dial antibacterial soap and water prior to dressing change. Cleanser: Wound Cleanser 1 x Per Week/15 Days Discharge Instructions: Cleanse the wound with wound cleanser prior to applying a clean dressing using gauze sponges, not tissue or cotton balls. Peri-Wound Care: Sween Lotion (Moisturizing lotion) 1 x Per Week/15 Days Discharge Instructions: Apply moisturizing lotion as directed Peri-Wound Care: Triamcinolone 15 (g) 1 x Per Week/15 Days Discharge Instructions: Use triamcinolone 15 (g) as directed Topical: Triamcinolone 1 x Per Week/15 Days Discharge Instructions: Apply Triamcinolone as directed Prim Dressing: Hydrofera Blue Classic Foam, 2x2 in 1 x Per Week/15 Days ary Discharge Instructions: Moisten with saline prior to applying to wound bed Secondary Dressing: Woven Gauze Sponge, Non-Sterile 4x4 in 1 x Per Week/15 Days Discharge Instructions: Apply over primary dressing as directed. Secondary Dressing: ABD Pad, 5x9 1 x Per Week/15 Days Discharge Instructions: Apply over primary dressing as directed. Com pression Wrap: ThreePress (3 layer compression wrap) 1 x Per Week/15 Days Discharge Instructions: Apply three layer compression as directed. Com pression Wrap: unna 1 x Per Week/15 Days Discharge Instructions: Wrap to top of leg to help hold 3 layer up WOUND #5: - Lower Leg Wound Laterality: Right, Anterior Cleanser: Soap and Water 1 x Per Week/15 Days Discharge Instructions: May shower and wash wound with dial antibacterial soap and water prior to dressing change. Cleanser: Wound Cleanser 1 x Per Week/15 Days Discharge Instructions: Cleanse the wound with wound cleanser prior to applying a clean dressing using gauze sponges, not tissue or cotton balls. Peri-Wound Care: Sween Lotion (Moisturizing lotion) 1 x Per Week/15 Days Discharge Instructions: Apply moisturizing lotion as directed Peri-Wound Care: Triamcinolone 15 (g) 1 x Per Week/15  Days Discharge Instructions: Use triamcinolone 15 (g) as directed Topical: Triamcinolone 1 x Per Week/15 Days Discharge Instructions: Apply Triamcinolone as directed Prim Dressing: Hydrofera Blue Classic Foam, 2x2 in 1 x Per Week/15 Days ary Discharge Instructions: Moisten with saline prior to applying to wound bed Secondary Dressing: Woven Gauze Sponge, Non-Sterile 4x4 in 1 x Per Week/15 Days Discharge Instructions: Apply over primary dressing as directed. Secondary Dressing: ABD Pad, 5x9 1 x Per Week/15 Days Discharge Instructions: Apply over primary dressing as directed.  Com pression Wrap: ThreePress (3 layer compression wrap) 1 x Per Week/15 Days Discharge Instructions: Apply three layer compression as directed. Com pression Wrap: unna 1 x Per Week/15 Days Discharge Instructions: Wrap to top of leg to help hold 3 layer up WOUND #6: - Lower Leg Wound Laterality: Right, Medial Cleanser: Soap and Water 1 x Per Week/15 Days Discharge Instructions: May shower and wash wound with dial antibacterial soap and water prior to dressing change. Cleanser: Wound Cleanser 1 x Per Week/15 Days Discharge Instructions: Cleanse the wound with wound cleanser prior to applying a clean dressing using gauze sponges, not tissue or cotton balls. Peri-Wound Care: Sween Lotion (Moisturizing lotion) 1 x Per Week/15 Days Discharge Instructions: Apply moisturizing lotion as directed Peri-Wound Care: Triamcinolone 15 (g) 1 x Per Week/15 Days Discharge Instructions: Use triamcinolone 15 (g) as directed Topical: Triamcinolone 1 x Per Week/15 Days Discharge Instructions: Apply Triamcinolone as directed Prim Dressing: Hydrofera Blue Classic Foam, 2x2 in 1 x Per Week/15 Days ary Discharge Instructions: Moisten with saline prior to applying to wound bed Secondary Dressing: Woven Gauze Sponge, Non-Sterile 4x4 in 1 x Per Week/15 Days Discharge Instructions: Apply over primary dressing as directed. Secondary Dressing:  ABD Pad, 5x9 1 x Per Week/15 Days Discharge Instructions: Apply over primary dressing as directed. Com pression Wrap: ThreePress (3 layer compression wrap) 1 x Per Week/15 Days Discharge Instructions: Apply three layer compression as directed. Com pression Wrap: unna 1 x Per Week/15 Days Discharge Instructions: Wrap to top of leg to help hold 3 layer up #1 I continued with Hydrofera Blue under 3 layer compression. I had some thoughts about 4-layer compression but I decided to see how she was doing next week. Everything looks a little better. One of the wound posterior right side is healed 2. I had some with her about compression stockings. She said she has never been able to get the standard over the toe stockings on. I asked our nurse to show her juxta lite stockings to see what she thought about this. She also would probably be a candidate for external compression pumps especially if we cannot get these to heal 3. Nonpitting edema. No tenderness in the erythematous areas I think she clearly has stasis dermatitis and probable Electronic Signature(s) Signed: 06/23/2020 4:37:05 PM By: [name] MD Entered By: [name] on 06/23/2020 11:06:29 -------------------------------------------------------------------------------- SuperBill Details Patient Name: Date of Service: Erica Gong. 06/23/2020 Medical Record Number: [id number] Patient Account Number: [id number] Date of Birth/Sex: Treating RN: [birth year] (50 y.o. 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 Facility Procedures CPT4 Code: [id number] Description: 11042 - DEB SUBQ TISSUE 20 SQ CM/< ICD-10 Diagnosis Description L97.821 Non-pressure chronic ulcer of other part of left lower leg limited to breakdown Modifier: of skin Quantity: 1 CPT4 Code: [id number] Description: 11045 - DEB SUBQ TISS EA ADDL 20CM ICD-10 Diagnosis Description L97.821 Non-pressure chronic ulcer of other part of left lower leg limited to breakdown Modifier: of skin Quantity: 1 Physician Procedures : CPT4 Code Description Modifier [id number] 11042 - WC PHYS SUBQ TISS 20 SQ  CM ICD-10 Diagnosis Description L97.821 Non-pressure chronic ulcer of other part of left lower leg limited to breakdown of skin Quantity: 1 : [id number] 11045 - WC PHYS SUBQ TISS EA ADDL 20 CM ICD-10 Diagnosis Description L97.821 Non-pressure chronic ulcer of other part of left lower leg limited to breakdown of skin Quantity: 1 Electronic Signature(s) Signed: 06/23/2020 4:37:05 PM By: [name] MD Entered By: [name] on 06/23/2020 11:06:47

## 2020-06-24 ENCOUNTER — Other Ambulatory Visit: Payer: Self-pay | Admitting: Family Medicine

## 2020-06-24 ENCOUNTER — Encounter: Payer: Self-pay | Admitting: Family Medicine

## 2020-06-24 ENCOUNTER — Other Ambulatory Visit: Payer: Medicare Other

## 2020-06-24 MED ORDER — FLUCONAZOLE 100 MG PO TABS: 100.0000 mg | ORAL_TABLET | Freq: Every day | ORAL | 0 refills | Status: DC

## 2020-06-30 ENCOUNTER — Encounter (HOSPITAL_BASED_OUTPATIENT_CLINIC_OR_DEPARTMENT_OTHER): Payer: Medicare Other | Admitting: Internal Medicine

## 2020-06-30 ENCOUNTER — Other Ambulatory Visit: Payer: Self-pay

## 2020-06-30 DIAGNOSIS — I87333 Chronic venous hypertension (idiopathic) with ulcer and inflammation of bilateral lower extremity: Secondary | ICD-10-CM | POA: Diagnosis not present

## 2020-06-30 DIAGNOSIS — L97811 Non-pressure chronic ulcer of other part of right lower leg limited to breakdown of skin: Secondary | ICD-10-CM | POA: Diagnosis not present

## 2020-06-30 DIAGNOSIS — L97821 Non-pressure chronic ulcer of other part of left lower leg limited to breakdown of skin: Secondary | ICD-10-CM | POA: Diagnosis not present

## 2020-06-30 DIAGNOSIS — M300 Polyarteritis nodosa: Secondary | ICD-10-CM | POA: Diagnosis not present

## 2020-06-30 DIAGNOSIS — I890 Lymphedema, not elsewhere classified: Secondary | ICD-10-CM | POA: Diagnosis not present

## 2020-07-03 NOTE — Progress Notes (Signed)
[name], [name] ([id number]) Visit Report for 06/30/2020 HPI Details Patient Name: Date of Service: [name], [name] 06/30/2020 9:30 A M Medical Record Number: [id number] Patient Account Number: [id number] Date of Birth/Sex: Treating RN: 1970/11/07 (50 y.o. [name], [name] Primary Care Provider: [name] Other Clinician: Referring Provider: Treating Provider/Extender: [name] in Treatment: 2 History of Present Illness HPI Description: ADMISSION 06/16/2020 This is a 50 year old complicated woman. She states she has had chronic swelling and draining from her legs for many many years. She has been prescribed compression stockings in the past but does not wear them because they are too tight. She has been dealing with open areas on the left and the right leg for several weeks now. There is drainage coming out of the area on the left leg. She saw her primary earlier this month and was put on Augmentin. A culture was apparently done remotely showing MSSA. More recently she has been of applying Neosporin and Bactroban. She has 4 open areas on the left and 3 on the right. Patient also has a complicated medical and dermatologic history. She has a history of cutaneous polyarteritis nodosa apparently diagnosed by a biopsies of her skin in the lower legs. She was on immunosuppressive's with Humira for a while but not currently. She also has psoriasis. Was followed by dermatology at [facility] healthcare Dr. Romona Curls. Her notes were very helpful to review. Also has morbid obesity continuing to continuing to contribute to both venous and lymphatic issues. In the past she is just simply been unable to tolerate compression stockings. She is onSkyriza For her psoriasis. She was hospitalized in March of this year for cellulitis of both legs with sepsis. Past medical history includes psoriasis, hyperlipidemia, chronic lower extremity edema as described, cutaneous  polyarteritis nodosa, sleep apnea fibromyalgia, osteoarthritis of both knees Patient's ABIs in our clinic were 0.96 on the right and 1.05 on the left she had venous reflux studies in May 2018. At that point on the right she had normal reflux times noted in the greater saphenous vein with no evidence of DVT No indirect evidence of obstruction proximal to the inguinal ligament. No evidence . of chronic venous insufficiency bilaterally and no evidence of a DVT 2/8; is a patient I admitted to the clinic last week. I thought she had venous insufficiency ulcers. Perhaps mostly lymphedema. She did tolerate the 3 layer compression well smaller. Most problematic areas on the left anterior mid tibia. There there is wounds right across from medial to lateral. Also a band of chronic venous stasis.. We used Hydrofera Blue on the wounds on the right leg wounds are not as dramatic. Small open areas anteriorly and medially. As noted she tolerated 3 layer compression well 2/15; patient's wounds are not any better on the left leg one of the areas on the right seems still closed her edema control with 3 layers is simply not good enough. We have been using Hydrofera Blue Electronic Signature(s) Signed: 06/30/2020 5:22:09 PM By: [name] MD Entered By: [name] on 06/30/2020 11:16:14 -------------------------------------------------------------------------------- Physical Exam Details Patient Name: Date of Service: Erica Devoid K. 06/30/2020 9:30 A M Medical Record Number: [id number] Patient Account Number: [id number] Date of Birth/Sex: Treating RN: Nov 26, 1970 (50 y.o. [name], [name] Primary Care Provider: [name] Other Clinician: Referring Provider: Treating Provider/Extender: [name] in Treatment: 2 Constitutional Patient is hypotensive. But appears well. Pulse regular and within target range for patient.Marland Kitchen Respirations regular,  non-labored and within  target range.. Temperature is normal and within the target range for the patient.. Cardiovascular Pedal pulses are palpable. Notes Wound exam; rims of small open wounds right across the mid part of her lower left tibia medially to laterally there is a small area posteriorly. This seems to be only one left on the right which is anteriorly. None of these require debridement. Notable for weeping edema fluid coming out of the wound Electronic Signature(s) Signed: 06/30/2020 5:22:09 PM By: [name] MD Entered By: [name] on 06/30/2020 11:17:48 -------------------------------------------------------------------------------- Physician Orders Details Patient Name: Date of Service: Erica Devoid K. 06/30/2020 9:30 A M Medical Record Number: [id number] Patient Account Number: [id number] Date of Birth/Sex: Treating RN: 1970/10/04 (50 y.o. [name], [name] Primary Care Provider: [name] Other Clinician: Referring Provider: Treating Provider/Extender: [name] in Treatment: 2 Verbal / Phone Orders: No Diagnosis Coding Follow-up Appointments Return Appointment in 1 week. Bathing/ Shower/ Hygiene May shower with protection but do not get wound dressing(s) wet. - May use cast protectors...you can find those at [facility] or [facility] Edema Control - Lymphedema / SCD / Other Lymphedema Pumps. Use Lymphedema pumps on leg(s) 2-3 times a day for 45-60 minutes. If wearing any wraps or hose, do not remove them. Continue exercising as instructed. - We will see about ordering pumps and getting approved through insurance. Elevate legs to the level of the heart or above for 30 minutes daily and/or when sitting, a frequency of: Avoid standing for long periods of time. Exercise regularly Other Edema Control Orders/Instructions: - We will order juxta lite compression socks for you today. Wound Treatment Wound #1 - Lower Leg Wound Laterality: Left,  Anterior Cleanser: Soap and Water 1 x Per Week/15 Days Discharge Instructions: May shower and wash wound with dial antibacterial soap and water prior to dressing change. Cleanser: Wound Cleanser 1 x Per Week/15 Days Discharge Instructions: Cleanse the wound with wound cleanser prior to applying a clean dressing using gauze sponges, not tissue or cotton balls. Peri-Wound Care: Sween Lotion (Moisturizing lotion) 1 x Per Week/15 Days Discharge Instructions: Apply moisturizing lotion as directed Peri-Wound Care: Triamcinolone 15 (g) 1 x Per Week/15 Days Discharge Instructions: Use triamcinolone 15 (g) as directed Topical: Triamcinolone 1 x Per Week/15 Days Discharge Instructions: Apply Triamcinolone as directed Prim Dressing: KerraCel Ag Gelling Fiber Dressing, 4x5 in (silver alginate) 1 x Per Week/15 Days ary Discharge Instructions: Apply silver alginate to wound bed as instructed Secondary Dressing: Woven Gauze Sponge, Non-Sterile 4x4 in 1 x Per Week/15 Days Discharge Instructions: Apply over primary dressing as directed. Secondary Dressing: ABD Pad, 5x9 1 x Per Week/15 Days Discharge Instructions: Apply over primary dressing as directed. Secondary Dressing: Zetuvit Plus 4x4 in 1 x Per Week/15 Days Discharge Instructions: Apply over primary dressing as directed. Secondary Dressing: CarboFLEX Odor Control Dressing, 4x4 in 1 x Per Week/15 Days Discharge Instructions: Apply over primary dressing as directed. Compression Wrap: FourPress (4 layer compression wrap) 1 x Per Week/15 Days Discharge Instructions: Apply four layer compression as directed. Compression Wrap: unna 1 x Per Week/15 Days Discharge Instructions: Wrap to top of leg to help hold 3 layer up Wound #3 - Lower Leg Wound Laterality: Left, Lateral, Proximal Cleanser: Soap and Water 1 x Per Week/15 Days Discharge Instructions: May shower and wash wound with dial antibacterial soap and water prior to dressing change. Cleanser: Wound  Cleanser 1 x Per Week/15 Days Discharge Instructions: Cleanse the wound with wound cleanser prior to  applying a clean dressing using gauze sponges, not tissue or cotton balls. Peri-Wound Care: Sween Lotion (Moisturizing lotion) 1 x Per Week/15 Days Discharge Instructions: Apply moisturizing lotion as directed Peri-Wound Care: Triamcinolone 15 (g) 1 x Per Week/15 Days Discharge Instructions: Use triamcinolone 15 (g) as directed Topical: Triamcinolone 1 x Per Week/15 Days Discharge Instructions: Apply Triamcinolone as directed Prim Dressing: KerraCel Ag Gelling Fiber Dressing, 4x5 in (silver alginate) 1 x Per Week/15 Days ary Discharge Instructions: Apply silver alginate to wound bed as instructed Secondary Dressing: Woven Gauze Sponge, Non-Sterile 4x4 in 1 x Per Week/15 Days Discharge Instructions: Apply over primary dressing as directed. Secondary Dressing: ABD Pad, 5x9 1 x Per Week/15 Days Discharge Instructions: Apply over primary dressing as directed. Secondary Dressing: Zetuvit Plus 4x4 in 1 x Per Week/15 Days Discharge Instructions: Apply over primary dressing as directed. Secondary Dressing: CarboFLEX Odor Control Dressing, 4x4 in 1 x Per Week/15 Days Discharge Instructions: Apply over primary dressing as directed. Compression Wrap: FourPress (4 layer compression wrap) 1 x Per Week/15 Days Discharge Instructions: Apply four layer compression as directed. Compression Wrap: unna 1 x Per Week/15 Days Discharge Instructions: Wrap to top of leg to help hold 3 layer up Wound #5 - Lower Leg Wound Laterality: Right, Anterior Cleanser: Soap and Water 1 x Per Week/15 Days Discharge Instructions: May shower and wash wound with dial antibacterial soap and water prior to dressing change. Cleanser: Wound Cleanser 1 x Per Week/15 Days Discharge Instructions: Cleanse the wound with wound cleanser prior to applying a clean dressing using gauze sponges, not tissue or cotton balls. Peri-Wound Care:  Sween Lotion (Moisturizing lotion) 1 x Per Week/15 Days Discharge Instructions: Apply moisturizing lotion as directed Peri-Wound Care: Triamcinolone 15 (g) 1 x Per Week/15 Days Discharge Instructions: Use triamcinolone 15 (g) as directed Topical: Triamcinolone 1 x Per Week/15 Days Discharge Instructions: Apply Triamcinolone as directed Prim Dressing: KerraCel Ag Gelling Fiber Dressing, 4x5 in (silver alginate) 1 x Per Week/15 Days ary Discharge Instructions: Apply silver alginate to wound bed as instructed Secondary Dressing: Woven Gauze Sponge, Non-Sterile 4x4 in 1 x Per Week/15 Days Discharge Instructions: Apply over primary dressing as directed. Secondary Dressing: ABD Pad, 5x9 1 x Per Week/15 Days Discharge Instructions: Apply over primary dressing as directed. Secondary Dressing: Zetuvit Plus 4x4 in 1 x Per Week/15 Days Discharge Instructions: Apply over primary dressing as directed. Secondary Dressing: CarboFLEX Odor Control Dressing, 4x4 in 1 x Per Week/15 Days Discharge Instructions: Apply over primary dressing as directed. Compression Wrap: FourPress (4 layer compression wrap) 1 x Per Week/15 Days Discharge Instructions: Apply four layer compression as directed. Compression Wrap: unna 1 x Per Week/15 Days Discharge Instructions: Wrap to top of leg to help hold 3 layer up Wound #6 - Lower Leg Wound Laterality: Right, Medial Cleanser: Soap and Water 1 x Per Week/15 Days Discharge Instructions: May shower and wash wound with dial antibacterial soap and water prior to dressing change. Cleanser: Wound Cleanser 1 x Per Week/15 Days Discharge Instructions: Cleanse the wound with wound cleanser prior to applying a clean dressing using gauze sponges, not tissue or cotton balls. Peri-Wound Care: Sween Lotion (Moisturizing lotion) 1 x Per Week/15 Days Discharge Instructions: Apply moisturizing lotion as directed Peri-Wound Care: Triamcinolone 15 (g) 1 x Per Week/15 Days Discharge  Instructions: Use triamcinolone 15 (g) as directed Topical: Triamcinolone 1 x Per Week/15 Days Discharge Instructions: Apply Triamcinolone as directed Prim Dressing: KerraCel Ag Gelling Fiber Dressing, 4x5 in (silver alginate) 1 x Per Week/15  Days ary Discharge Instructions: Apply silver alginate to wound bed as instructed Secondary Dressing: Woven Gauze Sponge, Non-Sterile 4x4 in 1 x Per Week/15 Days Discharge Instructions: Apply over primary dressing as directed. Secondary Dressing: ABD Pad, 5x9 1 x Per Week/15 Days Discharge Instructions: Apply over primary dressing as directed. Secondary Dressing: Zetuvit Plus 4x4 in 1 x Per Week/15 Days Discharge Instructions: Apply over primary dressing as directed. Secondary Dressing: CarboFLEX Odor Control Dressing, 4x4 in 1 x Per Week/15 Days Discharge Instructions: Apply over primary dressing as directed. Compression Wrap: FourPress (4 layer compression wrap) 1 x Per Week/15 Days Discharge Instructions: Apply four layer compression as directed. Compression Wrap: unna 1 x Per Week/15 Days Discharge Instructions: Wrap to top of leg to help hold 3 layer up Wound #8 - Lower Leg Wound Laterality: Right, Posterior Cleanser: Soap and Water 1 x Per Week/15 Days Discharge Instructions: May shower and wash wound with dial antibacterial soap and water prior to dressing change. Cleanser: Wound Cleanser 1 x Per Week/15 Days Discharge Instructions: Cleanse the wound with wound cleanser prior to applying a clean dressing using gauze sponges, not tissue or cotton balls. Peri-Wound Care: Sween Lotion (Moisturizing lotion) 1 x Per Week/15 Days Discharge Instructions: Apply moisturizing lotion as directed Peri-Wound Care: Triamcinolone 15 (g) 1 x Per Week/15 Days Discharge Instructions: Use triamcinolone 15 (g) as directed Topical: Triamcinolone 1 x Per Week/15 Days Discharge Instructions: Apply Triamcinolone as directed Prim Dressing: KerraCel Ag Gelling Fiber  Dressing, 4x5 in (silver alginate) 1 x Per Week/15 Days ary Discharge Instructions: Apply silver alginate to wound bed as instructed Secondary Dressing: Woven Gauze Sponge, Non-Sterile 4x4 in 1 x Per Week/15 Days Discharge Instructions: Apply over primary dressing as directed. Secondary Dressing: ABD Pad, 5x9 1 x Per Week/15 Days Discharge Instructions: Apply over primary dressing as directed. Secondary Dressing: Zetuvit Plus 4x4 in 1 x Per Week/15 Days Discharge Instructions: Apply over primary dressing as directed. Secondary Dressing: CarboFLEX Odor Control Dressing, 4x4 in 1 x Per Week/15 Days Discharge Instructions: Apply over primary dressing as directed. Compression Wrap: FourPress (4 layer compression wrap) 1 x Per Week/15 Days Discharge Instructions: Apply four layer compression as directed. Compression Wrap: unna 1 x Per Week/15 Days Discharge Instructions: Wrap to top of leg to help hold 3 layer up Electronic Signature(s) Signed: 06/30/2020 5:22:09 PM By: [name] MD Signed: 07/03/2020 5:09:47 PM By: [name] RN Entered By: [name] on 06/30/2020 11:01:16 -------------------------------------------------------------------------------- Problem List Details Patient Name: Date of Service: Erica Devoid K. 06/30/2020 9:30 A M Medical Record Number: [id number] Patient Account Number: [id number] Date of Birth/Sex: Treating RN: 04-01-1971 (50 y.o. [name], [name] Primary Care Provider: [name] Other Clinician: Referring Provider: Treating Provider/Extender: [name] in Treatment: 2 Active Problems ICD-10 Encounter Code Description Active Date MDM Diagnosis I87.333 Chronic venous hypertension (idiopathic) with ulcer and inflammation of 06/16/2020 No Yes bilateral lower extremity I89.0 Lymphedema, not elsewhere classified 06/16/2020 No Yes M30.0 Polyarteritis nodosa 06/16/2020 No Yes L97.821 Non-pressure chronic  ulcer of other part of left lower leg limited to breakdown 06/16/2020 No Yes of skin L97.811 Non-pressure chronic ulcer of other part of right lower leg limited to breakdown 06/16/2020 No Yes of skin Inactive Problems Resolved Problems Electronic Signature(s) Signed: 06/30/2020 5:22:09 PM By: [name] MD Entered By: [name] on 06/30/2020 11:15:15 -------------------------------------------------------------------------------- Progress Note Details Patient Name: Date of Service: Erica Devoid K. 06/30/2020 9:30 A M Medical Record Number: [id number] Patient Account Number: [id number] Date  of Birth/Sex: Treating RN: 29-Jul-1970 (51 y.o. [name], [name] Primary Care Provider: [name] Other Clinician: Referring Provider: Treating Provider/Extender: [name] in Treatment: 2 Subjective History of Present Illness (HPI) ADMISSION 06/16/2020 This is a 50 year old complicated woman. She states she has had chronic swelling and draining from her legs for many many years. She has been prescribed compression stockings in the past but does not wear them because they are too tight. She has been dealing with open areas on the left and the right leg for several weeks now. There is drainage coming out of the area on the left leg. She saw her primary earlier this month and was put on Augmentin. A culture was apparently done remotely showing MSSA. More recently she has been of applying Neosporin and Bactroban. She has 4 open areas on the left and 3 on the right. Patient also has a complicated medical and dermatologic history. She has a history of cutaneous polyarteritis nodosa apparently diagnosed by a biopsies of her skin in the lower legs. She was on immunosuppressive's with Humira for a while but not currently. She also has psoriasis. Was followed by dermatology at [facility] healthcare Dr. Romona Curls. Her notes were very helpful to review. Also has  morbid obesity continuing to continuing to contribute to both venous and lymphatic issues. In the past she is just simply been unable to tolerate compression stockings. She is onSkyriza For her psoriasis. She was hospitalized in March of this year for cellulitis of both legs with sepsis. Past medical history includes psoriasis, hyperlipidemia, chronic lower extremity edema as described, cutaneous polyarteritis nodosa, sleep apnea fibromyalgia, osteoarthritis of both knees Patient's ABIs in our clinic were 0.96 on the right and 1.05 on the left she had venous reflux studies in May 2018. At that point on the right she had normal reflux times noted in the greater saphenous vein with no evidence of DVT No indirect evidence of obstruction proximal to the inguinal ligament. No evidence . of chronic venous insufficiency bilaterally and no evidence of a DVT 2/8; is a patient I admitted to the clinic last week. I thought she had venous insufficiency ulcers. Perhaps mostly lymphedema. She did tolerate the 3 layer compression well smaller. Most problematic areas on the left anterior mid tibia. There there is wounds right across from medial to lateral. Also a band of chronic venous stasis.. We used Hydrofera Blue on the wounds on the right leg wounds are not as dramatic. Small open areas anteriorly and medially. As noted she tolerated 3 layer compression well 2/15; patient's wounds are not any better on the left leg one of the areas on the right seems still closed her edema control with 3 layers is simply not good enough. We have been using Hydrofera Blue Objective Constitutional Patient is hypotensive. But appears well. Pulse regular and within target range for patient.Marland Kitchen Respirations regular, non-labored and within target range.. Temperature is normal and within the target range for the patient.. Vitals Time Taken: 10:10 AM, Height: 62 in, Source: Stated, Weight: 304 lbs, Source: Stated, BMI: 55.6,  Temperature: 97.5 F, Pulse: 60 bpm, Respiratory Rate: 18 breaths/min, Blood Pressure: 95/55 mmHg, Capillary Blood Glucose: 76 mg/dl. General Notes: glucose per pt report this am Cardiovascular Pedal pulses are palpable. General Notes: Wound exam; rims of small open wounds right across the mid part of her lower left tibia medially to laterally there is a small area posteriorly. This seems to be only one left on the  right which is anteriorly. None of these require debridement. Notable for weeping edema fluid coming out of the wound Integumentary (Hair, Skin) Wound #1 status is Open. Original cause of wound was Gradually Appeared. The wound is located on the Left,Anterior Lower Leg. The wound measures 2.8cm length x 6.5cm width x 0.1cm depth; 14.294cm^2 area and 1.429cm^3 volume. There is Fat Layer (Subcutaneous Tissue) exposed. There is no tunneling or undermining noted. There is a large amount of serous drainage noted. The wound margin is flat and intact. There is large (67-100%) red granulation within the wound bed. There is a small (1-33%) amount of necrotic tissue within the wound bed including Adherent Slough. Wound #2 status is Healed - Epithelialized. Original cause of wound was Gradually Appeared. The wound is located on the Left,Medial Lower Leg. The wound measures 0cm length x 0cm width x 0cm depth; 0cm^2 area and 0cm^3 volume. There is no tunneling or undermining noted. There is a small amount of serous drainage noted. The wound margin is flat and intact. There is no granulation within the wound bed. There is no necrotic tissue within the wound bed. Wound #3 status is Open. Original cause of wound was Gradually Appeared. The wound is located on the Left,Proximal,Lateral Lower Leg. The wound measures 0.1cm length x 0.1cm width x 0.1cm depth; 0.008cm^2 area and 0.001cm^3 volume. The wound is limited to skin breakdown. There is no tunneling or undermining noted. There is a small amount of  serous drainage noted. The wound margin is flat and intact. There is small (1-33%) pink granulation within the wound bed. There is no necrotic tissue within the wound bed. Wound #4 status is Healed - Epithelialized. Original cause of wound was Gradually Appeared. The wound is located on the Left,Distal,Lateral Lower Leg. The wound measures 0cm length x 0cm width x 0cm depth; 0cm^2 area and 0cm^3 volume. There is no tunneling or undermining noted. There is a none present amount of drainage noted. The wound margin is flat and intact. There is no granulation within the wound bed. There is no necrotic tissue within the wound bed. Wound #5 status is Open. Original cause of wound was Gradually Appeared. The wound is located on the Right,Anterior Lower Leg. The wound measures 0.5cm length x 0.5cm width x 0.1cm depth; 0.196cm^2 area and 0.02cm^3 volume. There is no tunneling or undermining noted. There is a small amount of serous drainage noted. The wound margin is flat and intact. There is large (67-100%) pink granulation within the wound bed. There is no necrotic tissue within the wound bed. Wound #6 status is Open. Original cause of wound was Gradually Appeared. The wound is located on the Right,Medial Lower Leg. The wound measures 0.4cm length x 0.5cm width x 0.1cm depth; 0.157cm^2 area and 0.016cm^3 volume. There is Fat Layer (Subcutaneous Tissue) exposed. There is no tunneling or undermining noted. There is a small amount of serous drainage noted. The wound margin is flat and intact. There is large (67-100%) red, pink granulation within the wound bed. There is no necrotic tissue within the wound bed. Wound #8 status is Open. Original cause of wound was Gradually Appeared. The wound is located on the Right,Posterior Lower Leg. The wound measures 0.6cm length x 1cm width x 0.1cm depth; 0.471cm^2 area and 0.047cm^3 volume. The wound is limited to skin breakdown. There is no tunneling or undermining noted.  There is a small amount of serous drainage noted. The wound margin is flat and intact. There is large (67-100%) red granulation  within the wound bed. There is no necrotic tissue within the wound bed. 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 There was a Four Layer Compression Therapy Procedure by [name], RN. Post procedure Diagnosis Wound #1: Same as Pre-Procedure Wound #3 Pre-procedure diagnosis of Wound #3 is a Venous Leg Ulcer located on the Left,Proximal,Lateral Lower Leg . There was a Four Layer Compression Therapy Procedure by [name], RN. Post procedure Diagnosis Wound #3: Same as Pre-Procedure Wound #5 Pre-procedure diagnosis of Wound #5 is a Venous Leg Ulcer located on the Right,Anterior Lower Leg . There was a Four Layer Compression Therapy Procedure by [name], RN. Post procedure Diagnosis Wound #5: Same as Pre-Procedure Wound #6 Pre-procedure diagnosis of Wound #6 is a Venous Leg Ulcer located on the Right,Medial Lower Leg . There was a Four Layer Compression Therapy Procedure by [name], RN. Post procedure Diagnosis Wound #6: Same as Pre-Procedure Wound #8 Pre-procedure diagnosis of Wound #8 is a Lymphedema located on the Right,Posterior Lower Leg . There was a Four Layer Compression Therapy Procedure by [name], RN. Post procedure Diagnosis Wound #8: Same as Pre-Procedure Plan Follow-up Appointments: Return Appointment in 1 week. Bathing/ Shower/ Hygiene: May shower with protection  but do not get wound dressing(s) wet. - May use cast protectors...you can find those at [facility] or [facility] Edema Control - Lymphedema / SCD / Other: Lymphedema Pumps. Use Lymphedema pumps on leg(s) 2-3 times a day for 45-60 minutes. If wearing any wraps or hose, do not remove them. Continue exercising as instructed. - We will see about ordering pumps and getting approved through insurance. Elevate legs to the level of the heart or above for 30 minutes daily and/or when sitting, a frequency of: Avoid standing for long periods of time. Exercise regularly Other Edema Control Orders/Instructions: - We will order juxta lite compression socks for you today. WOUND #1: - Lower Leg Wound Laterality: Left, Anterior Cleanser: Soap and Water 1 x Per Week/15 Days Discharge Instructions: May shower and wash wound with dial antibacterial soap and water prior to dressing change. Cleanser: Wound Cleanser 1 x Per Week/15 Days Discharge Instructions: Cleanse the wound with wound cleanser prior to applying a clean dressing using gauze sponges, not tissue or cotton balls. Peri-Wound Care: Sween Lotion (Moisturizing lotion) 1 x Per Week/15 Days Discharge Instructions: Apply moisturizing lotion as directed Peri-Wound Care: Triamcinolone 15 (g) 1 x Per Week/15 Days Discharge Instructions: Use triamcinolone 15 (g) as directed Topical: Triamcinolone 1 x Per Week/15 Days Discharge Instructions: Apply Triamcinolone as directed Prim Dressing: KerraCel Ag Gelling Fiber Dressing, 4x5 in (silver alginate) 1 x Per Week/15 Days ary Discharge Instructions: Apply silver alginate to wound bed as instructed Secondary Dressing: Woven Gauze Sponge, Non-Sterile 4x4 in 1 x Per Week/15 Days Discharge Instructions: Apply over primary dressing as directed. Secondary Dressing: ABD Pad, 5x9 1 x Per Week/15 Days Discharge Instructions: Apply over primary dressing as directed. Secondary Dressing: Zetuvit Plus 4x4 in 1 x Per Week/15  Days Discharge Instructions: Apply over primary dressing as directed. Secondary Dressing: CarboFLEX Odor Control Dressing, 4x4 in 1 x Per Week/15 Days Discharge  Instructions: Apply over primary dressing as directed. Com pression Wrap: FourPress (4 layer compression wrap) 1 x Per Week/15 Days Discharge Instructions: Apply four layer compression as directed. Com pression Wrap: unna 1 x Per Week/15 Days Discharge Instructions: Wrap to top of leg to help hold 3 layer up WOUND #3: - Lower Leg Wound Laterality: Left, Lateral, Proximal Cleanser: Soap and Water 1 x Per Week/15 Days Discharge Instructions: May shower and wash wound with dial antibacterial soap and water prior to dressing change. Cleanser: Wound Cleanser 1 x Per Week/15 Days Discharge Instructions: Cleanse the wound with wound cleanser prior to applying a clean dressing using gauze sponges, not tissue or cotton balls. Peri-Wound Care: Sween Lotion (Moisturizing lotion) 1 x Per Week/15 Days Discharge Instructions: Apply moisturizing lotion as directed Peri-Wound Care: Triamcinolone 15 (g) 1 x Per Week/15 Days Discharge Instructions: Use triamcinolone 15 (g) as directed Topical: Triamcinolone 1 x Per Week/15 Days Discharge Instructions: Apply Triamcinolone as directed Prim Dressing: KerraCel Ag Gelling Fiber Dressing, 4x5 in (silver alginate) 1 x Per Week/15 Days ary Discharge Instructions: Apply silver alginate to wound bed as instructed Secondary Dressing: Woven Gauze Sponge, Non-Sterile 4x4 in 1 x Per Week/15 Days Discharge Instructions: Apply over primary dressing as directed. Secondary Dressing: ABD Pad, 5x9 1 x Per Week/15 Days Discharge Instructions: Apply over primary dressing as directed. Secondary Dressing: Zetuvit Plus 4x4 in 1 x Per Week/15 Days Discharge Instructions: Apply over primary dressing as directed. Secondary Dressing: CarboFLEX Odor Control Dressing, 4x4 in 1 x Per Week/15 Days Discharge Instructions: Apply  over primary dressing as directed. Com pression Wrap: FourPress (4 layer compression wrap) 1 x Per Week/15 Days Discharge Instructions: Apply four layer compression as directed. Com pression Wrap: unna 1 x Per Week/15 Days Discharge Instructions: Wrap to top of leg to help hold 3 layer up WOUND #5: - Lower Leg Wound Laterality: Right, Anterior Cleanser: Soap and Water 1 x Per Week/15 Days Discharge Instructions: May shower and wash wound with dial antibacterial soap and water prior to dressing change. Cleanser: Wound Cleanser 1 x Per Week/15 Days Discharge Instructions: Cleanse the wound with wound cleanser prior to applying a clean dressing using gauze sponges, not tissue or cotton balls. Peri-Wound Care: Sween Lotion (Moisturizing lotion) 1 x Per Week/15 Days Discharge Instructions: Apply moisturizing lotion as directed Peri-Wound Care: Triamcinolone 15 (g) 1 x Per Week/15 Days Discharge Instructions: Use triamcinolone 15 (g) as directed Topical: Triamcinolone 1 x Per Week/15 Days Discharge Instructions: Apply Triamcinolone as directed Prim Dressing: KerraCel Ag Gelling Fiber Dressing, 4x5 in (silver alginate) 1 x Per Week/15 Days ary Discharge Instructions: Apply silver alginate to wound bed as instructed Secondary Dressing: Woven Gauze Sponge, Non-Sterile 4x4 in 1 x Per Week/15 Days Discharge Instructions: Apply over primary dressing as directed. Secondary Dressing: ABD Pad, 5x9 1 x Per Week/15 Days Discharge Instructions: Apply over primary dressing as directed. Secondary Dressing: Zetuvit Plus 4x4 in 1 x Per Week/15 Days Discharge Instructions: Apply over primary dressing as directed. Secondary Dressing: CarboFLEX Odor Control Dressing, 4x4 in 1 x Per Week/15 Days Discharge Instructions: Apply over primary dressing as directed. Com pression Wrap: FourPress (4 layer compression wrap) 1 x Per Week/15 Days Discharge Instructions: Apply four layer compression as directed. Com pression  Wrap: unna 1 x Per Week/15 Days Discharge Instructions: Wrap to top of leg to help hold 3 layer up WOUND #6: - Lower Leg Wound Laterality: Right, Medial Cleanser: Soap and Water 1 x Per Week/15 Days Discharge Instructions: May  shower and wash wound with dial antibacterial soap and water prior to dressing change. Cleanser: Wound Cleanser 1 x Per Week/15 Days Discharge Instructions: Cleanse the wound with wound cleanser prior to applying a clean dressing using gauze sponges, not tissue or cotton balls. Peri-Wound Care: Sween Lotion (Moisturizing lotion) 1 x Per Week/15 Days Discharge Instructions: Apply moisturizing lotion as directed Peri-Wound Care: Triamcinolone 15 (g) 1 x Per Week/15 Days Discharge Instructions: Use triamcinolone 15 (g) as directed Topical: Triamcinolone 1 x Per Week/15 Days Discharge Instructions: Apply Triamcinolone as directed Prim Dressing: KerraCel Ag Gelling Fiber Dressing, 4x5 in (silver alginate) 1 x Per Week/15 Days ary Discharge Instructions: Apply silver alginate to wound bed as instructed Secondary Dressing: Woven Gauze Sponge, Non-Sterile 4x4 in 1 x Per Week/15 Days Discharge Instructions: Apply over primary dressing as directed. Secondary Dressing: ABD Pad, 5x9 1 x Per Week/15 Days Discharge Instructions: Apply over primary dressing as directed. Secondary Dressing: Zetuvit Plus 4x4 in 1 x Per Week/15 Days Discharge Instructions: Apply over primary dressing as directed. Secondary Dressing: CarboFLEX Odor Control Dressing, 4x4 in 1 x Per Week/15 Days Discharge Instructions: Apply over primary dressing as directed. Com pression Wrap: FourPress (4 layer compression wrap) 1 x Per Week/15 Days Discharge Instructions: Apply four layer compression as directed. Com pression Wrap: unna 1 x Per Week/15 Days Discharge Instructions: Wrap to top of leg to help hold 3 layer up WOUND #8: - Lower Leg Wound Laterality: Right, Posterior Cleanser: Soap and Water 1 x Per  Week/15 Days Discharge Instructions: May shower and wash wound with dial antibacterial soap and water prior to dressing change. Cleanser: Wound Cleanser 1 x Per Week/15 Days Discharge Instructions: Cleanse the wound with wound cleanser prior to applying a clean dressing using gauze sponges, not tissue or cotton balls. Peri-Wound Care: Sween Lotion (Moisturizing lotion) 1 x Per Week/15 Days Discharge Instructions: Apply moisturizing lotion as directed Peri-Wound Care: Triamcinolone 15 (g) 1 x Per Week/15 Days Discharge Instructions: Use triamcinolone 15 (g) as directed Topical: Triamcinolone 1 x Per Week/15 Days Discharge Instructions: Apply Triamcinolone as directed Prim Dressing: KerraCel Ag Gelling Fiber Dressing, 4x5 in (silver alginate) 1 x Per Week/15 Days ary Discharge Instructions: Apply silver alginate to wound bed as instructed Secondary Dressing: Woven Gauze Sponge, Non-Sterile 4x4 in 1 x Per Week/15 Days Discharge Instructions: Apply over primary dressing as directed. Secondary Dressing: ABD Pad, 5x9 1 x Per Week/15 Days Discharge Instructions: Apply over primary dressing as directed. Secondary Dressing: Zetuvit Plus 4x4 in 1 x Per Week/15 Days Discharge Instructions: Apply over primary dressing as directed. Secondary Dressing: CarboFLEX Odor Control Dressing, 4x4 in 1 x Per Week/15 Days Discharge Instructions: Apply over primary dressing as directed. Com pression Wrap: FourPress (4 layer compression wrap) 1 x Per Week/15 Days Discharge Instructions: Apply four layer compression as directed. Com pression Wrap: unna 1 x Per Week/15 Days Discharge Instructions: Wrap to top of leg to help hold 3 layer up 1. I have increased her to 4-layer compression still using the lymphedema pumps 2. Change the dressing to silver alginate 3. I think this lady is going to require compression pumps we will try to put these through her insurance Electronic Signature(s) Signed: 06/30/2020 5:22:09 PM  By: [name] MD Entered By: [name] on 06/30/2020 11:20:04 -------------------------------------------------------------------------------- SuperBill Details Patient Name: Date of Service: Erica Gong. 06/30/2020 Medical Record Number: [id number] Patient Account Number: [id number] Date of Birth/Sex: Treating RN: [birth year] (50 y.o. 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

## 2020-07-03 NOTE — Progress Notes (Signed)
Erica, Macias ([id number]) Visit Report for 06/30/2020 Arrival Information Details Patient Name: Date of Service: [name], [name] 06/30/2020 9:30 A M Medical Record Number: [id number] Patient Account Number: [id number] Date of Birth/Sex: Treating RN: 03/28/1971 (50 y.o. [name], [name] Primary Care Alette Kataoka: [name] Other Clinician: Referring Madisun Hargrove: Treating Kinlee Garrison/Extender: [name] in Treatment: 2 Visit Information History Since Last Visit Added or deleted any medications: No Patient Arrived: Kasandra Knudsen Any new allergies or adverse reactions: No Arrival Time: 10:09 Had a fall or experienced change in No Accompanied By: self activities of daily living that may affect Transfer Assistance: None risk of falls: Patient Identification Verified: Yes Signs or symptoms of abuse/neglect since last visito No Secondary Verification Process Completed: Yes Hospitalized since last visit: No Patient Requires Transmission-Based Precautions: No Implantable device outside of the clinic excluding No Patient Has Alerts: No cellular tissue based products placed in the center since last visit: Has Dressing in Place as Prescribed: Yes Has Compression in Place as Prescribed: Yes Pain Present Now: Yes Electronic Signature(s) Signed: 06/30/2020 5:47:53 PM By: Baruch Gouty RN, BSN Entered By: Baruch Gouty on 06/30/2020 10:09:42 -------------------------------------------------------------------------------- Compression Therapy Details Patient Name: Date of Service: Erica Devoid K. 06/30/2020 9:30 A M Medical Record Number: [id number] Patient Account Number: [id number] Date of Birth/Sex: Treating RN: 12/18/1970 (50 y.o. [name], [name] Primary Care Trine Fread: [name] Other Clinician: Referring Aveya Beal: Treating [name]/Extender: [name] in Treatment: 2 Compression Therapy Performed for Wound Assessment:  Wound #1 Left,Anterior Lower Leg Performed By: Clinician [name], RN Compression Type: Four Layer Post Procedure Diagnosis Same as Pre-procedure Electronic Signature(s) Signed: 07/03/2020 5:09:47 PM By: [name] RN Entered By: [name] on 06/30/2020 10:58:24 -------------------------------------------------------------------------------- Compression Therapy Details Patient Name: Date of Service: Erica Devoid K. 06/30/2020 9:30 A M Medical Record Number: [id number] Patient Account Number: [id number] Date of Birth/Sex: Treating RN: Feb 21, 1971 (50 y.o. [name], [name] Primary Care Arkin Imran: [name] Other Clinician: Referring [name]: Treating [name]/Extender: [name] in Treatment: 2 Compression Therapy Performed for Wound Assessment: Wound #3 Left,Proximal,Lateral Lower Leg Performed By: Clinician [name], RN Compression Type: Four Layer Post Procedure Diagnosis Same as Pre-procedure Electronic Signature(s) Signed: 07/03/2020 5:09:47 PM By: [name] RN Entered By: [name] on 06/30/2020 10:58:24 -------------------------------------------------------------------------------- Compression Therapy Details Patient Name: Date of Service: Erica Devoid K. 06/30/2020 9:30 A M Medical Record Number: [id number] Patient Account Number: [id number] Date of Birth/Sex: Treating RN: 02-12-1971 (50 y.o. [name], [name] Primary Care Luvern Mischke: [name] Other Clinician: Referring [name]: Treating [name]/Extender: [name] in Treatment: 2 Compression Therapy Performed for Wound Assessment: Wound #5 Right,Anterior Lower Leg Performed By: Clinician [name], RN Compression Type: Four Layer Post Procedure Diagnosis Same as Pre-procedure Electronic Signature(s) Signed: 07/03/2020 5:09:47 PM By: [name] RN Entered By: [name] on 06/30/2020 10:58:24 -------------------------------------------------------------------------------- Compression Therapy Details Patient Name: Date of Service: Erica Devoid K. 06/30/2020 9:30 A M Medical Record Number: [id number] Patient Account Number: [id number] Date of Birth/Sex: Treating RN: [birth year] (50 y.o. [name], [name] Primary Care [name]: [name] Other Clinician: Referring [name]: Treating [name]/Extender: [name] in Treatment: 2 Compression Therapy Performed for Wound Assessment: Wound #6 Right,Medial Lower Leg Performed By: Clinician [name], RN Compression Type: Four Layer Post Procedure Diagnosis Same as Pre-procedure Electronic Signature(s) Signed: 07/03/2020 5:09:47 PM By: [name] RN Entered By: [name] on 06/30/2020 10:58:24 -------------------------------------------------------------------------------- Compression Therapy Details  Patient Name: Date of Service: [name], [name] 06/30/2020 9:30 A M Medical Record Number: [id number] Patient Account Number: [id number] Date of Birth/Sex: Treating RN: [birth year] (50 y.o. [name], [name] Primary Care Rickard Kennerly: [name] Other Clinician: Referring [name]: Treating [name]/Extender: [name] in Treatment: 2 Compression Therapy Performed for Wound Assessment: Wound #8 Right,Posterior Lower Leg Performed By: Clinician [name], RN Compression Type: Four Layer Post Procedure Diagnosis Same as Pre-procedure Electronic Signature(s) Signed: 07/03/2020 5:09:47 PM By: [name] RN Entered By: [name] on 06/30/2020 10:58:24 -------------------------------------------------------------------------------- Encounter Discharge Information Details Patient Name: Date of Service: [name] K. 06/30/2020 9:30 A M Medical Record Number: [id number] Patient Account  Number: [id number] Date of Birth/Sex: Treating RN: Jul 17, 1970 (51 y.o. [name] Primary Care Aika Brzoska: [name] Other Clinician: Referring Leeroy Lovings: Treating Daequan Kozma/Extender: [name] in Treatment: 2 Encounter Discharge Information Items Discharge Condition: Stable Ambulatory Status: Ambulatory Discharge Destination: Home Transportation: Private Auto Accompanied By: alone Schedule Follow-up Appointment: Yes Clinical Summary of Care: Patient Declined Electronic Signature(s) Signed: 06/30/2020 5:45:50 PM By: Levan Hurst RN, BSN Entered By: Levan Hurst on 06/30/2020 17:24:23 -------------------------------------------------------------------------------- Lower Extremity Assessment Details Patient Name: Date of Service: Erica Devoid K. 06/30/2020 9:30 A M Medical Record Number: [id number] Patient Account Number: [id number] Date of Birth/Sex: Treating RN: 1971/02/12 (50 y.o. Erica Dutch Primary Care Adaia Matthies: [name] Other Clinician: Referring Aubriee Szeto: Treating Markel Kurtenbach/Extender: [name] in Treatment: 2 Edema Assessment Assessed: Shirlyn Goltz: No] Patrice Paradise: No] Edema: [Left: Yes] [Right: Yes] Calf Left: Right: Point of Measurement: 28 cm From Medial Instep 41.5 cm 41.5 cm Ankle Left: Right: Point of Measurement: 11 cm From Medial Instep 29.2 cm 27.8 cm Vascular Assessment Pulses: Dorsalis Pedis Palpable: [Left:Yes] [Right:Yes] Electronic Signature(s) Signed: 06/30/2020 5:47:53 PM By: Baruch Gouty RN, BSN Entered By: Baruch Gouty on 06/30/2020 10:30:49 -------------------------------------------------------------------------------- Multi Wound Chart Details Patient Name: Date of Service: Erica Devoid K. 06/30/2020 9:30 A M Medical Record Number: [id number] Patient Account Number: [id number] Date of Birth/Sex: Treating RN: [birth year] (50 y.o. [name], [name] Primary  Care Martia Dalby: [name] Other Clinician: Referring Tashena Ibach: Treating Ariel Wingrove/Extender: [name] in Treatment: 2 Vital Signs Height(in): 62 Capillary Blood Glucose(mg/dl): 69 Weight(lbs): 304 Pulse(bpm): 62 Body Mass Index(BMI): 17 Blood Pressure(mmHg): 95/55 Temperature(F): 97.5 Respiratory Rate(breaths/min): 18 Photos: [1:No Photos Left, Anterior Lower Leg] [2:No Photos Left, Medial Lower Leg] [3:No Photos Left, Proximal, Lateral Lower Leg] Wound Location: [1:Gradually Appeared] [2:Gradually Appeared] [3:Gradually Appeared] Wounding Event: [1:Venous Leg Ulcer] [2:Venous Leg Ulcer] [3:Venous Leg Ulcer] Primary Etiology: [1:Lymphedema, Sleep Apnea,] [2:Lymphedema, Sleep Apnea,] [3:Lymphedema, Sleep Apnea,] Comorbid History: [1:Hypertension, Peripheral Venous Disease, Osteoarthritis, Confinement Anxiety 05/18/2020] [2:Hypertension, Peripheral Venous Disease, Osteoarthritis, Confinement Anxiety 05/18/2020] [3:Hypertension, Peripheral Venous Disease,  Osteoarthritis, Confinement Anxiety 05/18/2020] Date Acquired: [1:2] [2:2] [3:2] Weeks of Treatment: [1:Open] [2:Healed - Epithelialized] [3:Open] Wound Status: [1:2.8x6.5x0.1] [2:0x0x0] [3:0.1x0.1x0.1] Measurements L x W x D (cm) [1:14.294] [2:0] [3:0.008] A (cm) : rea [1:1.429] [2:0] [3:0.001] Volume (cm) : [1:53.20%] [2:100.00%] [3:99.70%] % Reduction in Area: [1:53.20%] [2:100.00%] [3:99.60%] % Reduction in Volume: [1:Full Thickness Without Exposed] [2:Full Thickness Without Exposed] [3:Full Thickness Without Exposed] Classification: [1:Support Structures Large] [2:Support Structures Small] [3:Support Structures Small] Exudate Amount: [1:Serous] [2:Serous] [3:Serous] Exudate Type: [1:amber] [2:amber] [3:amber] Exudate Color: [1:Flat and Intact] [2:Flat and Intact] [3:Flat and Intact] Wound Margin: [1:Large (67-100%)] [2:None Present (0%)] [3:Small (1-33%)] Granulation Amount: [1:Red] [2:N/A]  [3:Pink] Granulation Quality: [1:Small (1-33%)] [2:None Present (0%)] [3:None Present (  0%)] Necrotic Amount: [1:Fat Layer (Subcutaneous Tissue): Yes Fascia: No] [3:Fascia: No] Exposed Structures: [1:Fascia: No Tendon: No Muscle: No Joint: No Bone: No Medium (34-66%)] [2:Fat Layer (Subcutaneous Tissue): No Tendon: No Muscle: No Joint: No Bone: No Large (67-100%)] [3:Fat Layer (Subcutaneous Tissue): No Tendon: No Muscle: No Joint: No Bone: No  Limited to Skin Breakdown Large (67-100%)] Epithelialization: [1:Compression Therapy] [2:N/A] [3:Compression Therapy] Wound Number: [id number] 6  Photos: No Photos No Photos No Photos Left, Distal, Lateral Lower Leg Right, Anterior Lower Leg Right, Medial Lower Leg Wound Location: Gradually Appeared Gradually Appeared Gradually Appeared Wounding Event: Venous Leg Ulcer Venous Leg Ulcer Venous Leg Ulcer Primary Etiology: Lymphedema, Sleep Apnea, Lymphedema, Sleep Apnea, Lymphedema, Sleep Apnea, Comorbid History: Hypertension, Peripheral Venous Hypertension, Peripheral Venous Hypertension, Peripheral Venous Disease, Osteoarthritis, Confinement Disease, Osteoarthritis, Confinement Disease, Osteoarthritis, Confinement Anxiety Anxiety Anxiety 05/18/2020 05/18/2020 05/18/2020 Date Acquired: 2 2 2  Weeks of Treatment: Healed - Epithelialized Open Open Wound Status: 0x0x0 0.5x0.5x0.1 0.4x0.5x0.1 Measurements L x W x D (cm) 0 0.196 0.157 A (cm) : rea 0 0.02 0.016 Volume (cm) : 100.00% 44.50% 52.40% % Reduction in Area: 100.00% 42.90% 51.50% % Reduction in Volume: Full Thickness Without Exposed Full Thickness Without Exposed Full Thickness Without Exposed Classification: Support Structures Support Structures Support Structures None Present Small Small Exudate Amount: N/A Serous Serous Exudate Type: N/A amber amber Exudate Color: Flat and Intact Flat and Intact Flat and Intact Wound Margin: None Present (0%) Large (67-100%) Large (67-100%) Granulation  Amount: N/A Pink Red, Pink Granulation Quality: None Present (0%) None Present (0%) None Present (0%) Necrotic Amount: Fascia: No Fascia: No Fat Layer (Subcutaneous Tissue): Yes Exposed Structures: Fat Layer (Subcutaneous Tissue): No Fat Layer (Subcutaneous Tissue): No Fascia: No Tendon: No Tendon: No Tendon: No Muscle: No Muscle: No Muscle: No Joint: No Joint: No Joint: No Bone: No Bone: No Bone: No Large (67-100%) Small (1-33%) Small (1-33%) Epithelialization: N/A Compression Therapy Compression Therapy Procedures Performed: Wound Number: 8 N/A N/A Photos: No Photos N/A N/A Right, Posterior Lower Leg N/A N/A Wound Location: Gradually Appeared N/A N/A Wounding Event: Lymphedema N/A N/A Primary Etiology: Lymphedema, Sleep Apnea, N/A N/A Comorbid History: Hypertension, Peripheral Venous Disease, Osteoarthritis, Confinement Anxiety 06/30/2020 N/A N/A Date Acquired: 0 N/A N/A Weeks of Treatment: Open N/A N/A Wound Status: 0.6x1x0.1 N/A N/A Measurements L x W x D (cm) 0.471 N/A N/A A (cm) : rea 0.047 N/A N/A Volume (cm) : N/A N/A N/A % Reduction in A rea: N/A N/A N/A % Reduction in Volume: Partial Thickness N/A N/A Classification: Small N/A N/A Exudate A mount: Serous N/A N/A Exudate Type: amber N/A N/A Exudate Color: Flat and Intact N/A N/A Wound Margin: Large (67-100%) N/A N/A Granulation A mount: Red N/A N/A Granulation Quality: None Present (0%) N/A N/A Necrotic A mount: Fascia: No N/A N/A Exposed Structures: Fat Layer (Subcutaneous Tissue): No Tendon: No Muscle: No Joint: No Bone: No Limited to Skin Breakdown Medium (34-66%) N/A N/A Epithelialization: Compression Therapy N/A N/A Procedures Performed: Treatment Notes Electronic Signature(s) Signed: 06/30/2020 5:22:09 PM By: [name] MD Signed: 07/03/2020 5:09:47 PM By: [name] RN Entered By: [name] on 06/30/2020  11:15:25 -------------------------------------------------------------------------------- Multi-Disciplinary Care Plan Details Patient Name: Date of Service: Erica Devoid K. 06/30/2020 9:30 A M Medical Record Number: [id number] Patient Account Number: [id number] Date of Birth/Sex: Treating RN: 08/27/1970 (50 y.o. [name], [name] Primary Care Daytona Hedman: [name] Other Clinician: Referring Shemeika Starzyk: Treating Axton Cihlar/Extender: [name] in Treatment: 2 Active Inactive Wound/Skin  Impairment Nursing Diagnoses: Impaired tissue integrity Knowledge deficit related to smoking impact on wound healing Goals: Patient will have a decrease in wound volume by X% from date: (specify in notes) Date Initiated: 06/16/2020 Target Resolution Date: 07/17/2020 Goal Status: Active Patient/caregiver will verbalize understanding of skin care regimen Date Initiated: 06/16/2020 Target Resolution Date: 07/17/2020 Goal Status: Active Ulcer/skin breakdown will have a volume reduction of 30% by week 4 Date Initiated: 06/16/2020 Target Resolution Date: 07/17/2020 Goal Status: Active Interventions: Assess patient/caregiver ability to obtain necessary supplies Assess patient/caregiver ability to perform ulcer/skin care regimen upon admission and as needed Assess ulceration(s) every visit Notes: Electronic Signature(s) Signed: 07/03/2020 5:09:47 PM By: [name] RN Entered By: [name] on 06/30/2020 11:01:32 -------------------------------------------------------------------------------- Pain Assessment Details Patient Name: Date of Service: Erica Devoid K. 06/30/2020 9:30 A M Medical Record Number: [id number] Patient Account Number: [id number] Date of Birth/Sex: Treating RN: June 27, 1970 (50 y.o. Erica Dutch Primary Care Kippy Gohman: [name] Other Clinician: Referring Drako Maese: Treating Marriana Hibberd/Extender: [name] in Treatment: 2 Active Problems Location of Pain Severity and Description of Pain Patient Has Paino Yes Site Locations Pain Location: Pain in Ulcers With Dressing Change: Yes Rate the pain. Current Pain Level: 2 Character of Pain Describe the Pain: Aching, Tender Pain Management and Medication Current Pain Management: Medication: Yes Is the Current Pain Management Adequate: Adequate How does your wound impact your activities of daily livingo Sleep: Yes Bathing: No Appetite: No Relationship With Others: No Bladder Continence: No Emotions: No Bowel Continence: No Work: No Toileting: No Drive: No Dressing: No Hobbies: No Electronic Signature(s) Signed: 06/30/2020 5:47:53 PM By: Baruch Gouty RN, BSN Entered By: Baruch Gouty on 06/30/2020 10:10:23 -------------------------------------------------------------------------------- Patient/Caregiver Education Details Patient Name: Date of Service: [name] 2/15/2022andnbsp9:30 A M Medical Record Number: [id number] Patient Account Number: [id number] Date of Birth/Gender: Treating RN: May 26, 1970 (50 y.o. [name], [name] Primary Care Physician: [name] Other Clinician: Referring Physician: Treating Physician/Extender: [name] in Treatment: 2 Education Assessment Education Provided To: Patient Education Topics Provided Basic Hygiene: Wound/Skin Impairment: Methods: Explain/Verbal Responses: State content correctly Electronic Signature(s) Signed: 07/03/2020 5:09:47 PM By: [name] RN Entered By: [name] on 06/30/2020 11:01:48 -------------------------------------------------------------------------------- Wound Assessment Details Patient Name: Date of Service: Erica Devoid K. 06/30/2020 9:30 A M Medical Record Number: [id number] Patient Account Number: [id number] Date of Birth/Sex: Treating RN: 24-Mar-1971 (50 y.o. Erica Dutch Primary Care Damontre Millea: [name] Other Clinician: Referring Venisa Frampton: Treating Ayanna Gheen/Extender: [name] in Treatment: 2 Wound Status Wound Number: 1 Primary Venous Leg Ulcer Etiology: Wound Location: Left, Anterior Lower Leg Wound Open Wounding Event: Gradually Appeared Status: Date Acquired: 05/18/2020 Comorbid Lymphedema, Sleep Apnea, Hypertension, Peripheral Venous Weeks Of Treatment: 2 History: Disease, Osteoarthritis, Confinement Anxiety Clustered Wound: No Photos Photo Uploaded By: Mikeal Hawthorne on 07/01/2020 12:01:29 Wound Measurements Length: (cm) 2.8 Width: (cm) 6.5 Depth: (cm) 0.1 Area: (cm) 14.294 Volume: (cm) 1.429 % Reduction in Area: 53.2% % Reduction in Volume: 53.2% Epithelialization: Medium (34-66%) Tunneling: No Undermining: No Wound Description Classification: Full Thickness Without Exposed Support Structures Wound Margin: Flat and Intact Exudate Amount: Large Exudate Type: Serous Exudate Color: amber Foul Odor After Cleansing: No Slough/Fibrino Yes Wound Bed Granulation Amount: Large (67-100%) Exposed Structure Granulation Quality: Red Fascia Exposed: No Necrotic Amount: Small (1-33%) Fat Layer (Subcutaneous Tissue) Exposed: Yes Necrotic Quality: Adherent Slough Tendon Exposed: No Muscle Exposed: No Joint Exposed: No Bone Exposed: No Treatment Notes Wound #1 (  Lower Leg) Wound Laterality: Left, Anterior Cleanser Soap and Water Discharge Instruction: May shower and wash wound with dial antibacterial soap and water prior to dressing change. Wound Cleanser Discharge Instruction: Cleanse the wound with wound cleanser prior to applying a clean dressing using gauze sponges, not tissue or cotton balls. Peri-Wound Care Sween Lotion (Moisturizing lotion) Discharge Instruction: Apply moisturizing lotion as directed Triamcinolone 15 (g) Discharge Instruction: Use triamcinolone 15 (g) as  directed Topical Triamcinolone Discharge Instruction: Apply Triamcinolone as directed Primary Dressing KerraCel Ag Gelling Fiber Dressing, 4x5 in (silver alginate) Discharge Instruction: Apply silver alginate to wound bed as instructed Secondary Dressing Woven Gauze Sponge, Non-Sterile 4x4 in Discharge Instruction: Apply over primary dressing as directed. ABD Pad, 5x9 Discharge Instruction: Apply over primary dressing as directed. Zetuvit Plus 4x4 in Discharge Instruction: Apply over primary dressing as directed. CarboFLEX Odor Control Dressing, 4x4 in Discharge Instruction: Apply over primary dressing as directed. Secured With Compression Wrap FourPress (4 layer compression wrap) Discharge Instruction: Apply four layer compression as directed. unna Discharge Instruction: Wrap to top of leg to help hold 3 layer up Compression Stockings Add-Ons Electronic Signature(s) Signed: 06/30/2020 5:47:53 PM By: Baruch Gouty RN, BSN Entered By: Baruch Gouty on 06/30/2020 10:40:54 -------------------------------------------------------------------------------- Wound Assessment Details Patient Name: Date of Service: [name] K. 06/30/2020 9:30 A M Medical Record Number: [id number] Patient Account Number: [id number] Date of Birth/Sex: Treating RN: [birth year] (50 y.o. Erica Dutch Primary Care Ysela Hettinger: [name] Other Clinician: Referring Crystalee Ventress: Treating Hamdi Vari/Extender: [name] in Treatment: 2 Wound Status Wound Number: 2 Primary Venous Leg Ulcer Etiology: Wound Location: Left, Medial Lower Leg Wound Healed - Epithelialized Wounding Event: Gradually Appeared Status: Date Acquired: 05/18/2020 Comorbid Lymphedema, Sleep Apnea, Hypertension, Peripheral Venous Weeks Of Treatment: 2 History: Disease, Osteoarthritis, Confinement Anxiety Clustered Wound: No Photos Photo Uploaded By: Mikeal Hawthorne on 07/01/2020 12:01:30 Wound  Measurements Length: (cm) Width: (cm) Depth: (cm) Area: (cm) Volume: (cm) 0 % Reduction in Area: 100% 0 % Reduction in Volume: 100% 0 Epithelialization: Large (67-100%) 0 Tunneling: No 0 Undermining: No Wound Description Classification: Full Thickness Without Exposed Support Structures Wound Margin: Flat and Intact Exudate Amount: Small Exudate Type: Serous Exudate Color: amber Foul Odor After Cleansing: No Slough/Fibrino No Wound Bed Granulation Amount: None Present (0%) Exposed Structure Necrotic Amount: None Present (0%) Fascia Exposed: No Fat Layer (Subcutaneous Tissue) Exposed: No Tendon Exposed: No Muscle Exposed: No Joint Exposed: No Bone Exposed: No Electronic Signature(s) Signed: 06/30/2020 5:47:53 PM By: Baruch Gouty RN, BSN Entered By: Baruch Gouty on 06/30/2020 10:41:10 -------------------------------------------------------------------------------- Wound Assessment Details Patient Name: Date of Service: Erica Devoid K. 06/30/2020 9:30 A M Medical Record Number: [id number] Patient Account Number: [id number] Date of Birth/Sex: Treating RN: 06/15/1970 (50 y.o. Erica Dutch Primary Care [name]: [name] Other Clinician: Referring Telsa Dillavou: Treating Latorsha Curling/Extender: [name] in Treatment: 2 Wound Status Wound Number: 3 Primary Venous Leg Ulcer Etiology: Wound Location: Left, Proximal, Lateral Lower Leg Wound Open Wounding Event: Gradually Appeared Status: Date Acquired: 05/18/2020 Comorbid Lymphedema, Sleep Apnea, Hypertension, Peripheral Venous Weeks Of Treatment: 2 History: Disease, Osteoarthritis, Confinement Anxiety Clustered Wound: No Photos Photo Uploaded By: Mikeal Hawthorne on 07/01/2020 12:01:06 Wound Measurements Length: (cm) 0.1 Width: (cm) 0.1 Depth: (cm) 0.1 Area: (cm) 0.008 Volume: (cm) 0.001 % Reduction in Area: 99.7% % Reduction in Volume: 99.6% Epithelialization: Large  (67-100%) Tunneling: No Undermining: No Wound Description Classification: Full Thickness Without Exposed Support Structures Wound Margin: Flat and Intact Exudate Amount:  Small Exudate Type: Serous Exudate Color: amber Foul Odor After Cleansing: No Slough/Fibrino Yes Wound Bed Granulation Amount: Small (1-33%) Exposed Structure Granulation Quality: Pink Fascia Exposed: No Necrotic Amount: None Present (0%) Fat Layer (Subcutaneous Tissue) Exposed: No Tendon Exposed: No Muscle Exposed: No Joint Exposed: No Bone Exposed: No Limited to Skin Breakdown Treatment Notes Wound #3 (Lower Leg) Wound Laterality: Left, Lateral, Proximal Cleanser Soap and Water Discharge Instruction: May shower and wash wound with dial antibacterial soap and water prior to dressing change. Wound Cleanser Discharge Instruction: Cleanse the wound with wound cleanser prior to applying a clean dressing using gauze sponges, not tissue or cotton balls. Peri-Wound Care Sween Lotion (Moisturizing lotion) Discharge Instruction: Apply moisturizing lotion as directed Triamcinolone 15 (g) Discharge Instruction: Use triamcinolone 15 (g) as directed Topical Triamcinolone Discharge Instruction: Apply Triamcinolone as directed Primary Dressing KerraCel Ag Gelling Fiber Dressing, 4x5 in (silver alginate) Discharge Instruction: Apply silver alginate to wound bed as instructed Secondary Dressing Woven Gauze Sponge, Non-Sterile 4x4 in Discharge Instruction: Apply over primary dressing as directed. ABD Pad, 5x9 Discharge Instruction: Apply over primary dressing as directed. Zetuvit Plus 4x4 in Discharge Instruction: Apply over primary dressing as directed. CarboFLEX Odor Control Dressing, 4x4 in Discharge Instruction: Apply over primary dressing as directed. Secured With Compression Wrap FourPress (4 layer compression wrap) Discharge Instruction: Apply four layer compression as directed. unna Discharge Instruction:  Wrap to top of leg to help hold 3 layer up Compression Stockings Add-Ons Electronic Signature(s) Signed: 06/30/2020 5:47:53 PM By: Baruch Gouty RN, BSN Entered By: Baruch Gouty on 06/30/2020 10:41:26 -------------------------------------------------------------------------------- Wound Assessment Details Patient Name: Date of Service: Erica Devoid K. 06/30/2020 9:30 A M Medical Record Number: [id number] Patient Account Number: [id number] Date of Birth/Sex: Treating RN: [birth year] (50 y.o. 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, Osteoarthritis, Confinement Anxiety Clustered Wound: No Photos Photo Uploaded By: Mikeal Hawthorne on 07/01/2020 12:01:06 Wound Measurements Length: (cm) Width: (cm) Depth: (cm) Area: (cm) Volume: (cm) 0 % Reduction in Area: 100% 0 % Reduction in Volume: 100% 0 Epithelialization: Large (67-100%) 0 Tunneling: No 0 Undermining: No Wound Description Classification: Full Thickness Without Exposed Support Structures Wound Margin: Flat and Intact Exudate Amount: None Present Foul Odor After Cleansing: No Slough/Fibrino No Wound Bed Granulation Amount: None Present (0%) Exposed Structure Necrotic Amount: None Present (0%) Fascia Exposed: No Fat Layer (Subcutaneous Tissue) Exposed: No Tendon Exposed: No Muscle Exposed: No Joint Exposed: No Bone Exposed: No Electronic Signature(s) Signed: 06/30/2020 5:47:53 PM By: Baruch Gouty RN, BSN Entered By: Baruch Gouty on 06/30/2020  10:41:41 -------------------------------------------------------------------------------- Wound Assessment Details Patient Name: Date of Service: Erica Devoid K. 06/30/2020 9:30 A M Medical Record Number: [id number] Patient Account Number: [id number] Date of Birth/Sex: Treating RN: 04/10/1971 (50 y.o. 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, Osteoarthritis, Confinement Anxiety Clustered Wound: No Photos Photo Uploaded By: Ronnald Ramp,  Dedrick on 07/01/2020 12:01:57 Wound Measurements Length: (cm) 0.5 Width: (cm) 0.5 Depth: (cm) 0.1 Area: (cm) 0.196 Volume: (cm) 0.02 % Reduction in Area: 44.5% % Reduction in Volume: 42.9% Epithelialization: Small (1-33%) Tunneling: No Undermining: No Wound Description Classification: Full Thickness Without Exposed Support Structures Wound Margin: Flat and Intact Exudate Amount: Small Exudate Type: Serous Exudate Color: amber Foul Odor After Cleansing: No Slough/Fibrino Yes Wound Bed Granulation Amount: Large (67-100%) Exposed Structure Granulation Quality: Pink Fascia Exposed: No Necrotic Amount: None Present (0%) Fat Layer (Subcutaneous Tissue) Exposed: No Tendon Exposed: No Muscle Exposed: No Joint Exposed: No Bone Exposed: No Treatment Notes Wound #5 (Lower Leg) Wound Laterality: Right, Anterior Cleanser Soap and Water Discharge Instruction: May shower and wash wound with dial antibacterial soap and water prior to dressing change. Wound Cleanser Discharge Instruction: Cleanse the wound with wound  cleanser prior to applying a clean dressing using gauze sponges, not tissue or cotton balls. Peri-Wound Care Sween Lotion (Moisturizing lotion) Discharge Instruction: Apply moisturizing lotion as directed Triamcinolone 15 (g) Discharge Instruction: Use triamcinolone 15 (g) as directed Topical Triamcinolone Discharge Instruction: Apply Triamcinolone as directed Primary Dressing KerraCel Ag Gelling Fiber Dressing, 4x5 in (silver alginate) Discharge Instruction: Apply silver alginate to wound bed as instructed Secondary Dressing Woven Gauze Sponge, Non-Sterile 4x4 in Discharge Instruction: Apply over primary dressing as directed. ABD Pad, 5x9 Discharge Instruction: Apply over primary dressing as directed. Zetuvit Plus 4x4 in Discharge Instruction: Apply over primary dressing as directed. CarboFLEX Odor Control Dressing, 4x4 in Discharge Instruction: Apply over primary dressing as directed. Secured With Compression Wrap FourPress (4 layer compression wrap) Discharge Instruction: Apply four layer compression as directed. unna Discharge Instruction: Wrap to top of leg to help hold 3 layer up Compression Stockings Add-Ons Electronic Signature(s) Signed: 06/30/2020 5:47:53 PM By: Baruch Gouty RN, BSN Entered By: Baruch Gouty on 06/30/2020 10:41:57 -------------------------------------------------------------------------------- Wound Assessment Details Patient Name: Date of Service: [name] K. 06/30/2020 9:30 A M Medical Record Number: [id number] Patient Account Number: [id number] Date of Birth/Sex: Treating RN: 18-May-1970 (50 y.o. 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, Osteoarthritis, Confinement Anxiety Clustered Wound: No Photos Photo Uploaded By: Mikeal Hawthorne on 07/01/2020 12:01:57 Wound Measurements Length: (cm) 0.4 Width: (cm) 0.5 Depth: (cm) 0.1 Area: (cm) 0.157 Volume: (cm) 0.016 % Reduction in Area: 52.4% % Reduction in Volume: 51.5% Epithelialization: Small (1-33%) Tunneling: No Undermining: No Wound Description Classification: Full Thickness Without Exposed Support Structures Wound Margin: Flat and Intact Exudate Amount: Small Exudate Type: Serous Exudate Color: amber Foul Odor After Cleansing: No Slough/Fibrino Yes Wound Bed Granulation Amount: Large (67-100%) Exposed Structure Granulation Quality: Red, Pink Fascia Exposed: No Necrotic Amount: None Present (0%) Fat Layer (Subcutaneous Tissue) Exposed: Yes Tendon Exposed: No Muscle Exposed: No Joint Exposed: No Bone Exposed: No Treatment Notes Wound #6 (Lower Leg) Wound Laterality: Right, Medial Cleanser Soap and Water Discharge Instruction: May shower and wash wound with dial antibacterial soap and water prior to dressing change. Wound Cleanser Discharge Instruction: Cleanse the wound with wound cleanser prior to applying a clean dressing using gauze sponges, not tissue or cotton balls. Peri-Wound Care Sween Lotion (Moisturizing lotion) Discharge Instruction: Apply moisturizing lotion as directed Triamcinolone  15 (g) Discharge Instruction: Use triamcinolone 15 (g) as directed Topical Triamcinolone Discharge Instruction: Apply Triamcinolone as directed Primary Dressing KerraCel Ag Gelling Fiber Dressing, 4x5 in (silver alginate) Discharge Instruction: Apply silver alginate to wound bed as instructed Secondary Dressing Woven Gauze Sponge, Non-Sterile 4x4 in Discharge Instruction: Apply over primary dressing as  directed. ABD Pad, 5x9 Discharge Instruction: Apply over primary dressing as directed. Zetuvit Plus 4x4 in Discharge Instruction: Apply over primary dressing as directed. CarboFLEX Odor Control Dressing, 4x4 in Discharge Instruction: Apply over primary dressing as directed. Secured With Compression Wrap FourPress (4 layer compression wrap) Discharge Instruction: Apply four layer compression as directed. unna Discharge Instruction: Wrap to top of leg to help hold 3 layer up Compression Stockings Add-Ons Electronic Signature(s) Signed: 06/30/2020 5:47:53 PM By: Baruch Gouty RN, BSN Entered By: Baruch Gouty on 06/30/2020 10:42:14 -------------------------------------------------------------------------------- Wound Assessment Details Patient Name: Date of Service: [name] K. 06/30/2020 9:30 A M Medical Record Number: [id number] Patient Account Number: [id number] Date of Birth/Sex: Treating RN: Oct 23, 1970 (50 y.o. Erica Dutch Primary Care [name]: [name] Other Clinician: Referring Thales Knipple: Treating [name]/Extender: [name] in Treatment: 2 Wound Status Wound Number: 8 Primary Lymphedema Etiology: Wound Location: Right, Posterior Lower Leg Wound Open Wounding Event: Gradually Appeared Status: Date Acquired: 06/30/2020 Comorbid Lymphedema, Sleep Apnea, Hypertension, Peripheral Venous Weeks Of Treatment: 0 History: Disease, Osteoarthritis, Confinement Anxiety Clustered Wound: No Photos Photo Uploaded By: Mikeal Hawthorne on 07/01/2020 12:02:39 Wound Measurements Length: (cm) 0.6 Width: (cm) 1 Depth: (cm) 0.1 Area: (cm) 0.471 Volume: (cm) 0.047 % Reduction in Area: % Reduction in Volume: Epithelialization: Medium (34-66%) Tunneling: No Undermining: No Wound Description Classification: Partial Thickness Wound Margin: Flat and Intact Exudate Amount: Small Exudate Type: Serous Exudate Color: amber Foul  Odor After Cleansing: No Slough/Fibrino No Wound Bed Granulation Amount: Large (67-100%) Exposed Structure Granulation Quality: Red Fascia Exposed: No Necrotic Amount: None Present (0%) Fat Layer (Subcutaneous Tissue) Exposed: No Tendon Exposed: No Muscle Exposed: No Joint Exposed: No Bone Exposed: No Limited to Skin Breakdown Treatment Notes Wound #8 (Lower Leg) Wound Laterality: Right, Posterior Cleanser Soap and Water Discharge Instruction: May shower and wash wound with dial antibacterial soap and water prior to dressing change. Wound Cleanser Discharge Instruction: Cleanse the wound with wound cleanser prior to applying a clean dressing using gauze sponges, not tissue or cotton balls. Peri-Wound Care Sween Lotion (Moisturizing lotion) Discharge Instruction: Apply moisturizing lotion as directed Triamcinolone 15 (g) Discharge Instruction: Use triamcinolone 15 (g) as directed Topical Triamcinolone Discharge Instruction: Apply Triamcinolone as directed Primary Dressing KerraCel Ag Gelling Fiber Dressing, 4x5 in (silver alginate) Discharge Instruction: Apply silver alginate to wound bed as instructed Secondary Dressing Woven Gauze Sponge, Non-Sterile 4x4 in Discharge Instruction: Apply over primary dressing as directed. ABD Pad, 5x9 Discharge Instruction: Apply over primary dressing as directed. Zetuvit Plus 4x4 in Discharge Instruction: Apply over primary dressing as directed. CarboFLEX Odor Control Dressing, 4x4 in Discharge Instruction: Apply over primary dressing as directed. Secured With Compression Wrap FourPress (4 layer compression wrap) Discharge Instruction: Apply four layer compression as directed. unna Discharge Instruction: Wrap to top of leg to help hold 3 layer up Compression Stockings Add-Ons Electronic Signature(s) Signed: 06/30/2020 5:47:53 PM By: Baruch Gouty RN, BSN Entered By: Baruch Gouty on 06/30/2020  10:43:19 -------------------------------------------------------------------------------- Vitals Details Patient Name: Date of Service: Erica Devoid K. 06/30/2020 9:30 A M Medical Record Number: [id number] Patient Account Number: [id number] Date of Birth/Sex: Treating RN: 1970-05-19 (49 y.o. F) Boehlein,  [name] Primary Care Markasia Carrol: [name] Other Clinician: Referring Aarti Mankowski: Treating Davis Ambrosini/Extender: [name] in Treatment: 2 Vital Signs Time Taken: 10:10 Temperature (F): 97.5 Height (in): 62 Pulse (bpm): 60 Source: Stated Respiratory Rate (breaths/min): 18 Weight (lbs): 304 Blood Pressure (mmHg): 95/55 Source: Stated Capillary Blood Glucose (mg/dl): 76 Body Mass Index (BMI): 55.6 Reference Range: 80 - 120 mg / dl Notes glucose per pt report this am Electronic Signature(s) Signed: 06/30/2020 5:47:53 PM By: Baruch Gouty RN, BSN Entered By: Baruch Gouty on 06/30/2020 10:29:33

## 2020-07-06 ENCOUNTER — Other Ambulatory Visit: Payer: Self-pay | Admitting: Family Medicine

## 2020-07-06 ENCOUNTER — Telehealth: Payer: Self-pay | Admitting: Family Medicine

## 2020-07-06 ENCOUNTER — Other Ambulatory Visit: Payer: Self-pay

## 2020-07-06 DIAGNOSIS — J1282 Pneumonia due to coronavirus disease 2019: Secondary | ICD-10-CM

## 2020-07-06 DIAGNOSIS — U071 COVID-19: Secondary | ICD-10-CM

## 2020-07-06 MED ORDER — FLUCONAZOLE 150 MG PO TABS: 150.0000 mg | ORAL_TABLET | Freq: Once | ORAL | 0 refills | Status: AC

## 2020-07-06 NOTE — Telephone Encounter (Signed)
Ok to send Diflucan 150mg  x1 dose, may repeat in 3 days if needed.  #2, no refills

## 2020-07-06 NOTE — Telephone Encounter (Signed)
Rx has been sent to patient pharmacy

## 2020-07-06 NOTE — Telephone Encounter (Signed)
Is it ok to fill? There are no more refills on this medication on file.

## 2020-07-06 NOTE — Telephone Encounter (Signed)
..  Medication Refills  Last OV:  Medication:  Diflucan   Pharmacy:  [facility], [place]  Let patient know to contact pharmacy at the end of the day to make sure medication is ready.   Please notify patient to allow 48-72 hours to process.  Encourage patient to contact the pharmacy for refills or they can request refills through [place] out below:   Last refill:  QTY:  Refill Date:    Other Comments:  Patient states that she gets a yeast infection every time she takes antibiotics  Okay for refill?  Please advise.

## 2020-07-07 ENCOUNTER — Other Ambulatory Visit: Payer: Self-pay

## 2020-07-07 ENCOUNTER — Encounter (HOSPITAL_BASED_OUTPATIENT_CLINIC_OR_DEPARTMENT_OTHER): Payer: Medicare Other | Admitting: Internal Medicine

## 2020-07-07 ENCOUNTER — Other Ambulatory Visit: Payer: Medicare Other

## 2020-07-07 DIAGNOSIS — M300 Polyarteritis nodosa: Secondary | ICD-10-CM | POA: Diagnosis not present

## 2020-07-07 DIAGNOSIS — I87333 Chronic venous hypertension (idiopathic) with ulcer and inflammation of bilateral lower extremity: Secondary | ICD-10-CM | POA: Diagnosis not present

## 2020-07-07 DIAGNOSIS — I890 Lymphedema, not elsewhere classified: Secondary | ICD-10-CM | POA: Diagnosis not present

## 2020-07-07 DIAGNOSIS — L97811 Non-pressure chronic ulcer of other part of right lower leg limited to breakdown of skin: Secondary | ICD-10-CM | POA: Diagnosis not present

## 2020-07-07 DIAGNOSIS — L97821 Non-pressure chronic ulcer of other part of left lower leg limited to breakdown of skin: Secondary | ICD-10-CM | POA: Diagnosis not present

## 2020-07-07 NOTE — Progress Notes (Addendum)
JOHNYE, KIST ([id number]) Visit Report for 07/07/2020 HPI Details Patient Name: Date of Service: HA IRAM, ASTORINO 07/07/2020 8:45 A M Medical Record Number: [id number] Patient Account Number: [id number] Date of Birth/Sex: Treating RN: January 31, 1971 (50 y.o. [name], [name] Primary Care Provider: [name] Other Clinician: Referring Provider: Treating Provider/Extender: [name] in Treatment: 3 History of Present Illness HPI Description: ADMISSION 06/16/2020 This is a 50 year old complicated woman. She states she has had chronic swelling and draining from her legs for many many years. She has been prescribed compression stockings in the past but does not wear them because they are too tight. She has been dealing with open areas on the left and the right leg for several weeks now. There is drainage coming out of the area on the left leg. She saw her primary earlier this month and was put on Augmentin. A culture was apparently done remotely showing MSSA. More recently she has been of applying Neosporin and Bactroban. She has 4 open areas on the left and 3 on the right. Patient also has a complicated medical and dermatologic history. She has a history of cutaneous polyarteritis nodosa apparently diagnosed by a biopsies of her skin in the lower legs. She was on immunosuppressive's with Humira for a while but not currently. She also has psoriasis. Was followed by dermatology at [facility] healthcare Dr. Romona Curls. Her notes were very helpful to review. Also has morbid obesity continuing to continuing to contribute to both venous and lymphatic issues. In the past she is just simply been unable to tolerate compression stockings. She is onSkyriza For her psoriasis. She was hospitalized in March of this year for cellulitis of both legs with sepsis. Past medical history includes psoriasis, hyperlipidemia, chronic lower extremity edema as described, cutaneous  polyarteritis nodosa, sleep apnea fibromyalgia, osteoarthritis of both knees Patient's ABIs in our clinic were 0.96 on the right and 1.05 on the left she had venous reflux studies in May 2018. At that point on the right she had normal reflux times noted in the greater saphenous vein with no evidence of DVT No indirect evidence of obstruction proximal to the inguinal ligament. No evidence . of chronic venous insufficiency bilaterally and no evidence of a DVT 2/8; is a patient I admitted to the clinic last week. I thought she had venous insufficiency ulcers. Perhaps mostly lymphedema. She did tolerate the 3 layer compression well smaller. Most problematic areas on the left anterior mid tibia. There there is wounds right across from medial to lateral. Also a band of chronic venous stasis.. We used Hydrofera Blue on the wounds on the right leg wounds are not as dramatic. Small open areas anteriorly and medially. As noted she tolerated 3 layer compression well 2/15; patient's wounds are not any better on the left leg one of the areas on the right seems still closed her edema control with 3 layers is simply not good enough. We have been using Hydrofera Blue 2/22; patient's wounds on the right leg are healed. Still an area on the left anterior mid tibia area that has small open areas. She has what looks to be a cellulitis with complicating intertrigo surrounding the popliteal fossa on the left as well. They tell me that her primary doctor put her on Diflucan I think for intertrigo in the abdomen. She does not yet have her juxta lite stockings Electronic Signature(s) Signed: 07/07/2020 5:22:30 PM By: [name] MD Entered By: [name] on 07/07/2020 10:29:13 --------------------------------------------------------------------------------  Physical Exam Details Patient Name: Date of Service: ROLANDA, CAMPA 07/07/2020 8:45 A M Medical Record Number: [id number] Patient Account Number:  [id number] Date of Birth/Sex: Treating RN: August 03, 1970 (50 y.o. [name], [name] Primary Care Provider: [name] Other Clinician: Referring Provider: Treating Provider/Extender: [name] in Treatment: 3 Constitutional Sitting or standing Blood Pressure is within target range for patient.. Pulse regular and within target range for patient.Marland Kitchen Respirations regular, non-labored and within target range.. Temperature is normal and within the target range for the patient.Marland Kitchen Appears in no distress. Cardiovascular She has a lot of lymphedema in her thighs. Notes Wound exam; rims of small open areas across the right anterior are closed also in the left anterior tibia this looks better although there is still stasis dermatitis and still some open wounds but there is superficial. She has a area of intertrigo in the pannus folds of her popliteal fossa but there is some erythema extending laterally with tenderness. Electronic Signature(s) Signed: 07/07/2020 5:22:30 PM By: [name] MD Entered By: [name] on 07/07/2020 10:30:34 -------------------------------------------------------------------------------- Physician Orders Details Patient Name: Date of Service: [name] 07/07/2020 8:45 A M Medical Record Number: [id number] Patient Account Number: [id number] Date of Birth/Sex: Treating RN: 1970/11/21 (50 y.o. [name], [name] Primary Care Provider: [name] Other Clinician: Referring Provider: Treating Provider/Extender: [name] in Treatment: 3 Verbal / Phone Orders: No Diagnosis Coding Follow-up Appointments Return Appointment in 1 week. Bathing/ Shower/ Hygiene May shower with protection but do not get wound dressing(s) wet. - May use cast protectors...you can find those at [facility] or [facility] Edema Control - Lymphedema / SCD / Other Lymphedema Pumps. Use Lymphedema pumps on leg(s) 2-3 times a  day for 45-60 minutes. If wearing any wraps or hose, do not remove them. Continue exercising as instructed. - We will see about ordering pumps and getting approved through insurance. Elevate legs to the level of the heart or above for 30 minutes daily and/or when sitting, a frequency of: Avoid standing for long periods of time. Exercise regularly Other Edema Control Orders/Instructions: - We will order juxta lite compression socks for you today. Non Wound Condition Other Non Wound Condition Orders/Instructions: - Apply calcium alginate with silver to skin fold on left leg. Pt. to change everyday. May use ABD pad and netting to go over it. Wound Treatment Wound #1 - Lower Leg Wound Laterality: Left, Anterior Prim Dressing: KerraCel Ag Gelling Fiber Dressing, 2x2 in (silver alginate) (DME) (Generic) 1 x Per Week/15 Days ary Discharge Instructions: Apply silver alginate to wound bed as instructed Secondary Dressing: ABD Pad, 5x9 (DME) 1 x Per Week/15 Days Discharge Instructions: Apply over primary dressing as directed. Compression Stockings: Circaid Juxta Lite Compression Wrap Left Leg Compression Amount: 30-40 mmHG Discharge Instructions: Apply Circaid Juxta Lite Compression Wrap daily as instructed. Apply first thing in the morning, remove at night before bed. Wound #5 - Lower Leg Wound Laterality: Right, Anterior Prim Dressing: KerraCel Ag Gelling Fiber Dressing, 2x2 in (silver alginate) (DME) (Generic) 1 x Per Week/15 Days ary Discharge Instructions: Apply silver alginate to wound bed as instructed Secondary Dressing: ABD Pad, 5x9 (DME) 1 x Per Week/15 Days Discharge Instructions: Apply over primary dressing as directed. Compression Stockings: Circaid Juxta Lite Compression Wrap (DME) Right Leg Compression Amount: 30-40 mmHG Discharge Instructions: Apply Circaid Juxta Lite Compression Wrap daily as instructed. Apply first thing in the morning, remove at night before bed. Patient  Medications llergies: niacin, sulfamethoxazole,  aspirin, Bactrim, benzoin, cephalexin, clindamycin, doxycycline, iohexol, lisinopril, naproxen, Sulfa (Sulfonamide A Antibiotics) Notifications Medication Indication Start End celluliltis left leg 07/07/2020 Augmentin DOSE oral 875 mg-125 mg tablet - 1 tablet oral bid for 7 days Electronic Signature(s) Signed: 07/08/2020 5:04:01 PM By: [name] RN Signed: 07/09/2020 5:09:37 PM By: [name] MD Previous Signature: 07/07/2020 10:02:18 AM Version By: [name] MD Entered By: [name] on 07/08/2020 08:46:31 -------------------------------------------------------------------------------- Problem List Details Patient Name: Date of Service: [name] 07/07/2020 8:45 A M Medical Record Number: [id number] Patient Account Number: [id number] Date of Birth/Sex: Treating RN: May 27, 1970 (50 y.o. [name], [name] Primary Care Provider: [name] Other Clinician: Referring Provider: Treating Provider/Extender: [name] in Treatment: 3 Active Problems ICD-10 Encounter Code Description Active Date MDM Diagnosis I87.333 Chronic venous hypertension (idiopathic) with ulcer and inflammation of 06/16/2020 No Yes bilateral lower extremity I89.0 Lymphedema, not elsewhere classified 06/16/2020 No Yes M30.0 Polyarteritis nodosa 06/16/2020 No Yes L97.821 Non-pressure chronic ulcer of other part of left lower leg limited to breakdown 06/16/2020 No Yes of skin L97.811 Non-pressure chronic ulcer of other part of right lower leg limited to breakdown 06/16/2020 No Yes of skin L03.116 Cellulitis of left lower limb 07/07/2020 No Yes Inactive Problems Resolved Problems Electronic Signature(s) Signed: 07/07/2020 5:22:30 PM By: [name] MD Entered By: [name] on 07/07/2020 10:26:31 -------------------------------------------------------------------------------- Progress Note  Details Patient Name: Date of Service: [name] 07/07/2020 8:45 A M Medical Record Number: [id number] Patient Account Number: [id number] Date of Birth/Sex: Treating RN: May 05, 1971 (50 y.o. [name], [name] Primary Care Provider: [name] Other Clinician: Referring Provider: Treating Provider/Extender: [name] in Treatment: 3 Subjective History of Present Illness (HPI) ADMISSION 06/16/2020 This is a 50 year old complicated woman. She states she has had chronic swelling and draining from her legs for many many years. She has been prescribed compression stockings in the past but does not wear them because they are too tight. She has been dealing with open areas on the left and the right leg for several weeks now. There is drainage coming out of the area on the left leg. She saw her primary earlier this month and was put on Augmentin. A culture was apparently done remotely showing MSSA. More recently she has been of applying Neosporin and Bactroban. She has 4 open areas on the left and 3 on the right. Patient also has a complicated medical and dermatologic history. She has a history of cutaneous polyarteritis nodosa apparently diagnosed by a biopsies of her skin in the lower legs. She was on immunosuppressive's with Humira for a while but not currently. She also has psoriasis. Was followed by dermatology at [facility] healthcare Dr. Romona Curls. Her notes were very helpful to review. Also has morbid obesity continuing to continuing to contribute to both venous and lymphatic issues. In the past she is just simply been unable to tolerate compression stockings. She is onSkyriza For her psoriasis. She was hospitalized in March of this year for cellulitis of both legs with sepsis. Past medical history includes psoriasis, hyperlipidemia, chronic lower extremity edema as described, cutaneous polyarteritis nodosa, sleep apnea fibromyalgia, osteoarthritis of  both knees Patient's ABIs in our clinic were 0.96 on the right and 1.05 on the left she had venous reflux studies in May 2018. At that point on the right she had normal reflux times noted in the greater saphenous vein with no evidence of DVT No indirect evidence of obstruction proximal to  the inguinal ligament. No evidence . of chronic venous insufficiency bilaterally and no evidence of a DVT 2/8; is a patient I admitted to the clinic last week. I thought she had venous insufficiency ulcers. Perhaps mostly lymphedema. She did tolerate the 3 layer compression well smaller. Most problematic areas on the left anterior mid tibia. There there is wounds right across from medial to lateral. Also a band of chronic venous stasis.. We used Hydrofera Blue on the wounds on the right leg wounds are not as dramatic. Small open areas anteriorly and medially. As noted she tolerated 3 layer compression well 2/15; patient's wounds are not any better on the left leg one of the areas on the right seems still closed her edema control with 3 layers is simply not good enough. We have been using Hydrofera Blue 2/22; patient's wounds on the right leg are healed. Still an area on the left anterior mid tibia area that has small open areas. She has what looks to be a cellulitis with complicating intertrigo surrounding the popliteal fossa on the left as well. They tell me that her primary doctor put her on Diflucan I think for intertrigo in the abdomen. She does not yet have her juxta lite stockings Objective Constitutional Sitting or standing Blood Pressure is within target range for patient.. Pulse regular and within target range for patient.Marland Kitchen Respirations regular, non-labored and within target range.. Temperature is normal and within the target range for the patient.Marland Kitchen Appears in no distress. Vitals Time Taken: 9:05 AM, Height: 62 in, Source: Stated, Weight: 304 lbs, Source: Stated, BMI: 55.6, Temperature: 98.4 F, Pulse:  92 bpm, Respiratory Rate: 18 breaths/min, Blood Pressure: 113/71 mmHg, Capillary Blood Glucose: 109 mg/dl. General Notes: glucose per pt report yesterday Cardiovascular She has a lot of lymphedema in her thighs. General Notes: Wound exam; rims of small open areas across the right anterior are closed also in the left anterior tibia this looks better although there is still stasis dermatitis and still some open wounds but there is superficial. ooShe has a area of intertrigo in the pannus folds of her popliteal fossa but there is some erythema extending laterally with tenderness. Integumentary (Hair, Skin) Wound #1 status is Open. Original cause of wound was Gradually Appeared. The date acquired was: 05/18/2020. The wound has been in treatment 3 weeks. The wound is located on the Left,Anterior Lower Leg. The wound measures 0.6cm length x 0.7cm width x 0.1cm depth; 0.33cm^2 area and 0.033cm^3 volume. There is Fat Layer (Subcutaneous Tissue) exposed. There is no tunneling or undermining noted. There is a medium amount of serous drainage noted. The wound margin is flat and intact. There is large (67-100%) red granulation within the wound bed. There is no necrotic tissue within the wound bed. Wound #3 status is Healed - Epithelialized. Original cause of wound was Gradually Appeared. The date acquired was: 05/18/2020. The wound has been in treatment 3 weeks. The wound is located on the Left,Proximal,Lateral Lower Leg. The wound measures 0cm length x 0cm width x 0cm depth; 0cm^2 area and 0cm^3 volume. The wound is limited to skin breakdown. There is no tunneling or undermining noted. There is a none present amount of drainage noted. The wound margin is flat and intact. There is no granulation within the wound bed. There is no necrotic tissue within the wound bed. Wound #5 status is Open. Original cause of wound was Gradually Appeared. The date acquired was: 05/18/2020. The wound has been in treatment 3 weeks.  The wound  is located on the Right,Anterior Lower Leg. The wound measures 0.1cm length x 0.1cm width x 0.1cm depth; 0.008cm^2 area and 0.001cm^3 volume. There is no tunneling or undermining noted. There is a small amount of serous drainage noted. The wound margin is flat and intact. There is no granulation within the wound bed. There is no necrotic tissue within the wound bed. Wound #6 status is Open. Original cause of wound was Gradually Appeared. The date acquired was: 05/18/2020. The wound has been in treatment 3 weeks. The wound is located on the Right,Medial Lower Leg. The wound measures 0cm length x 0cm width x 0cm depth; 0cm^2 area and 0cm^3 volume. There is no tunneling or undermining noted. There is a small amount of serous drainage noted. The wound margin is flat and intact. There is no granulation within the wound bed. There is no necrotic tissue within the wound bed. Wound #8 status is Healed - Epithelialized. Original cause of wound was Gradually Appeared. The date acquired was: 06/30/2020. The wound has been in treatment 1 weeks. The wound is located on the Right,Posterior Lower Leg. The wound measures 0cm length x 0cm width x 0cm depth; 0cm^2 area and 0cm^3 volume. The wound is limited to skin breakdown. There is no tunneling or undermining noted. There is a none present amount of drainage noted. The wound margin is flat and intact. There is no granulation within the wound bed. There is no necrotic tissue within the wound bed. Assessment Active Problems ICD-10 Chronic venous hypertension (idiopathic) with ulcer and inflammation of bilateral lower extremity Lymphedema, not elsewhere classified Polyarteritis nodosa Non-pressure chronic ulcer of other part of left lower leg limited to breakdown of skin Non-pressure chronic ulcer of other part of right lower leg limited to breakdown of skin Cellulitis of left lower limb Procedures Wound #1 Pre-procedure diagnosis of Wound #1 is a Venous  Leg Ulcer located on the Left,Anterior Lower Leg . There was a Four Layer Compression Therapy Procedure by [name], RN. Post procedure Diagnosis Wound #1: Same as Pre-Procedure Wound #5 Pre-procedure diagnosis of Wound #5 is a Venous Leg Ulcer located on the Right,Anterior Lower Leg . There was a Four Layer Compression Therapy Procedure by [name], RN. Post procedure Diagnosis Wound #5: Same as Pre-Procedure Plan Follow-up Appointments: Return Appointment in 1 week. Bathing/ Shower/ Hygiene: May shower with protection but do not get wound dressing(s) wet. - May use cast protectors...you can find those at [facility] or [facility] Edema Control - Lymphedema / SCD / Other: Lymphedema Pumps. Use Lymphedema pumps on leg(s) 2-3 times a day for 45-60 minutes. If wearing any wraps or hose, do not remove them. Continue exercising as instructed. - We will see about ordering pumps and getting approved through insurance. Elevate legs to the level of the heart or above for 30 minutes daily and/or when sitting, a frequency of: Avoid standing for long periods of time. Exercise regularly Other Edema Control Orders/Instructions: - We will order juxta lite compression socks for you today. Non Wound Condition: Other Non Wound Condition Orders/Instructions: - Apply calcium alginate with silver to skin fold on left leg. Pt. to change everyday. May use ABD pad and netting to go over it. The following medication(s) was prescribed: Augmentin oral 875 mg-125 mg tablet 1 tablet oral bid for 7 days for celluliltis left leg starting 07/07/2020 WOUND #1: - Lower Leg Wound Laterality: Left, Anterior Com pression Stockings: Circaid Juxta Lite Compression Wrap Compression Amount: 30-40 mmHg (left) Discharge Instructions: Apply Circaid Juxta  Lite Compression Wrap daily as instructed. Apply first thing in the morning, remove at night before bed. WOUND #5: - Lower Leg Wound Laterality: Right, Anterior Com  pression Stockings: Circaid Juxta Lite Compression Wrap (DME) Compression Amount: 30-40 mmHg (right) Discharge Instructions: Apply Circaid Juxta Lite Compression Wrap daily as instructed. Apply first thing in the morning, remove at night before bed. 1. We are continuing with silver alginate 2. Still wrapping the left leg and the right leg 3. Still trying to determine where we are with the orders for juxta lite stockings. 4. It is likely she is going to require compression pumps massive amount of lymphedema in the thighs 5. I think she has intertrigo in the fold of her popliteal fossa. Cannot rule out cellulitis in this area either and I put her on Augmentin. She is allergic to cephalosporins but has tolerated Augmentin well in the past Electronic Signature(s) Signed: 07/07/2020 5:22:30 PM By: [name] MD Entered By: [name] on 07/07/2020 10:32:12 -------------------------------------------------------------------------------- SuperBill Details Patient Name: Date of Service: [name] 07/07/2020 Medical Record Number: [id number] Patient Account Number: [id number] Date of Birth/Sex: Treating RN: [birth year] (50 y.o. [name], [name] Primary Care Provider: [name] Other Clinician: Referring Provider: Treating Provider/Extender: [name] in Treatment: 3 Diagnosis Coding ICD-10 Codes Code Description [phone] Chronic venous hypertension (idiopathic) with ulcer and inflammation of bilateral lower extremity I89.0 Lymphedema, not elsewhere classified M30.0 Polyarteritis nodosa L97.821 Non-pressure chronic ulcer of other part of left lower leg limited to breakdown of skin L97.811 Non-pressure chronic ulcer of other part of right lower leg limited to breakdown of skin Facility Procedures CPT4: Code [id number] foot Description: 1 BILATERAL: Application of multi-layer venous compression system; leg (below knee), including ankle and  . Modifier: Quantity: 1 Physician Procedures : CPT4 Code Description Modifier [id number] - WC PHYS LEVEL 4 - EST PT ICD-10 Diagnosis Description L97.821 Non-pressure chronic ulcer of other part of left lower leg limited to breakdown of skin L97.811 Non-pressure chronic ulcer of other part of  right lower leg limited to breakdown of skin I89.0 Lymphedema, not elsewhere classified I87.333 Chronic venous hypertension (idiopathic) with ulcer and inflammation of bilateral lower extremity Quantity: 1 Electronic Signature(s) Signed: 07/07/2020 5:22:30 PM By: [name] MD Entered By: [name] on 07/07/2020 10:32:39

## 2020-07-07 NOTE — Progress Notes (Addendum)
SPOORTHI, TRIMBOLI ([id number]) Visit Report for 07/07/2020 Arrival Information Details Patient Name: Date of Service: [name], [name] 07/07/2020 8:45 A M Medical Record Number: [id number] Patient Account Number: [id number] Date of Birth/Sex: Treating RN: Oct 11, 1970 (49 y.o. [name], [name] Primary Care [name]: [name] Other Clinician: Referring [name]: Treating [name]/Extender: [name] in Treatment: 3 Visit Information History Since Last Visit Added or deleted any medications: No Patient Arrived: Ambulatory Any new allergies or adverse reactions: No Arrival Time: 09:04 Had a fall or experienced change in No Accompanied By: self activities of daily living that may affect Transfer Assistance: None risk of falls: Patient Identification Verified: Yes Signs or symptoms of abuse/neglect since last visito No Secondary Verification Process Completed: Yes Hospitalized since last visit: No Patient Requires Transmission-Based Precautions: No Implantable device outside of the clinic excluding No Patient Has Alerts: No cellular tissue based products placed in the center since last visit: Has Dressing in Place as Prescribed: Yes Has Compression in Place as Prescribed: Yes Pain Present Now: Yes Electronic Signature(s) Signed: 07/07/2020 5:38:13 PM By: Erica Deed RN, BSN Entered By: [name] on 07/07/2020 09:05:37 -------------------------------------------------------------------------------- Compression Therapy Details Patient Name: Date of Service: Erica Buff K. 07/07/2020 8:45 A M Medical Record Number: [id number] Patient Account Number: [id number] Date of Birth/Sex: Treating RN: 1970-09-22 (49 y.o. [name], [name] Primary Care Eleftherios Dudenhoeffer: [name] Other Clinician: Referring Shealeigh Dunstan: Treating [name]/Extender: [name] in Treatment: 3 Compression Therapy Performed for Wound  Assessment: Wound #1 Left,Anterior Lower Leg Performed By: Clinician Erica Mu, RN Compression Type: Four Layer Post Procedure Diagnosis Same as Pre-procedure Electronic Signature(s) Signed: 07/07/2020 5:43:17 PM By: Erica Mu RN Entered By: [name] on 07/07/2020 09:53:49 -------------------------------------------------------------------------------- Compression Therapy Details Patient Name: Date of Service: Erica Napoleon. 07/07/2020 8:45 A M Medical Record Number: [id number] Patient Account Number: [id number] Date of Birth/Sex: Treating RN: June 05, 1970 (49 y.o. [name], [name] Primary Care Johnda Billiot: [name] Other Clinician: Referring [name]: Treating [name]/Extender: [name] in Treatment: 3 Compression Therapy Performed for Wound Assessment: Wound #5 Right,Anterior Lower Leg Performed By: Clinician Erica Mu, RN Compression Type: Four Layer Post Procedure Diagnosis Same as Pre-procedure Electronic Signature(s) Signed: 07/07/2020 5:43:17 PM By: Erica Mu RN Entered By: [name] on 07/07/2020 09:53:49 -------------------------------------------------------------------------------- Encounter Discharge Information Details Patient Name: Date of Service: Erica Buff K. 07/07/2020 8:45 A M Medical Record Number: [id number] Patient Account Number: [id number] Date of Birth/Sex: Treating RN: [birth year] (49 y.o. [name] Primary Care Ellington Greenslade: [name] Other Clinician: Referring Hurshell Dino: Treating [name]/Extender: [name] in Treatment: 3 Encounter Discharge Information Items Discharge Condition: Stable Ambulatory Status: Ambulatory Discharge Destination: Home Transportation: Private Auto Accompanied By: alone Schedule Follow-up Appointment: Yes Clinical Summary of Care: Patient Declined Electronic Signature(s) Signed:  07/07/2020 5:33:10 PM By: Erica Abts RN, BSN Entered By: [name] on 07/07/2020 12:49:29 -------------------------------------------------------------------------------- Lower Extremity Assessment Details Patient Name: Date of Service: Erica GRISEL, TRAVASSOS 07/07/2020 8:45 A M Medical Record Number: [id number] Patient Account Number: [id number] Date of Birth/Sex: Treating RN: [birth year] (49 y.o. Tommye Macias Primary Care Haroon Shatto: [name] Other Clinician: Referring Raima Geathers: Treating Alim Cattell/Extender: [name] in Treatment: 3 Edema Assessment Assessed: [name]: No] Franne Forts: No] Edema: [Left: Yes] [Right: Yes] Calf Left: Right: Point of Measurement: 28 cm From Medial Instep 39 cm 41.2 cm Ankle Left: Right: Point of Measurement: 11 cm From Medial Instep 26.9 cm 26 cm Vascular  Assessment Pulses: Dorsalis Pedis Palpable: [Left:No] [Right:Yes] Electronic Signature(s) Signed: 07/07/2020 5:38:13 PM By: Erica Deed RN, BSN Entered By: [name] on 07/07/2020 09:11:33 -------------------------------------------------------------------------------- Multi Wound Chart Details Patient Name: Date of Service: Erica Buff K. 07/07/2020 8:45 A M Medical Record Number: [id number] Patient Account Number: [id number] Date of Birth/Sex: Treating RN: May 18, 1970 (49 y.o. [name], [name] Primary Care Aking Klabunde: [name] Other Clinician: Referring Brynn Mulgrew: Treating Lamere Lightner/Extender: [name] in Treatment: 3 Vital Signs Height(in): 62 Capillary Blood Glucose(mg/dl): 409 Weight(lbs): 811 Pulse(bpm): 92 Body Mass Index(BMI): 56 Blood Pressure(mmHg): 113/71 Temperature(F): 98.4 Respiratory Rate(breaths/min): 18 Photos: [1:No Photos Left, Anterior Lower Leg] [3:No Photos Left, Proximal, Lateral Lower Leg] [5:No Photos Right, Anterior Lower Leg] Wound Location: [1:Gradually Appeared]  [3:Gradually Appeared] [5:Gradually Appeared] Wounding Event: [1:Venous Leg Ulcer] [3:Venous Leg Ulcer] [5:Venous Leg Ulcer] Primary Etiology: [1:Lymphedema, Sleep Apnea,] [3:Lymphedema, Sleep Apnea,] [5:Lymphedema, Sleep Apnea,] Comorbid History: [1:Hypertension, Peripheral Venous Disease, Osteoarthritis, Confinement Disease, Osteoarthritis, Confinement Anxiety 05/18/2020] [3:Hypertension, Peripheral Venous Anxiety 05/18/2020] [5:Hypertension, Peripheral Venous Disease,  Osteoarthritis, Confinement Anxiety 05/18/2020] Date Acquired: [1:3] [3:3] [5:3] Weeks of Treatment: [1:Open] [3:Healed - Epithelialized] [5:Open] Wound Status: [1:0.6x0.7x0.1] [3:0x0x0] [5:0.1x0.1x0.1] Measurements L x W x D (cm) [1:0.33] [3:0] [5:0.008] A (cm) : rea [1:0.033] [3:0] [5:0.001] Volume (cm) : [1:98.90%] [3:100.00%] [5:97.70%] % Reduction in Area: [1:98.90%] [3:100.00%] [5:97.10%] % Reduction in Volume: [1:Full Thickness Without Exposed] [3:Full Thickness Without Exposed] [5:Full Thickness Without Exposed] Classification: [1:Support Structures Medium] [3:Support Structures None Present] [5:Support Structures Small] Exudate Amount: [1:Serous] [3:N/A] [5:Serous] Exudate Type: [1:amber] [3:N/A] [5:amber] Exudate Color: [1:Flat and Intact] [3:Flat and Intact] [5:Flat and Intact] Wound Margin: [1:Large (67-100%)] [3:None Present (0%)] [5:None Present (0%)] Granulation Amount: [1:Red] [3:N/A] [5:N/A] Granulation Quality: [1:None Present (0%)] [3:None Present (0%)] [5:None Present (0%)] Necrotic Amount: [1:Fat Layer (Subcutaneous Tissue): Yes Fascia: No] [5:Fascia: No] Exposed Structures: [1:Fascia: No Tendon: No Muscle: No Joint: No Bone: No Medium (34-66%)] [3:Fat Layer (Subcutaneous Tissue): No Tendon: No Muscle: No Joint: No Bone: No Limited to Skin Breakdown Large (67-100%)] [5:Fat Layer (Subcutaneous Tissue): No Tendon: No Muscle:  No Joint: No Bone: No Large (67-100%)] Epithelialization: [1:Compression Therapy]  [3:N/A] [5:Compression Therapy] Wound Number: 6 8 N/A Photos: No Photos No Photos N/A Right, Medial Lower Leg Right, Posterior Lower Leg N/A Wound Location: Gradually Appeared Gradually Appeared N/A Wounding Event: Venous Leg Ulcer Lymphedema N/A Primary Etiology: Lymphedema, Sleep Apnea, Lymphedema, Sleep Apnea, N/A Comorbid History: Hypertension, Peripheral Venous Hypertension, Peripheral Venous Disease, Osteoarthritis, Confinement Disease, Osteoarthritis, Confinement Anxiety Anxiety 05/18/2020 06/30/2020 N/A Date Acquired: 3 1 N/A Weeks of Treatment: Open Healed - Epithelialized N/A Wound Status: 0x0x0 0x0x0 N/A Measurements L x W x D (cm) 0 0 N/A A (cm) : rea 0 0 N/A Volume (cm) : 100.00% 100.00% N/A % Reduction in Area: 100.00% 100.00% N/A % Reduction in Volume: Full Thickness Without Exposed Partial Thickness N/A Classification: Support Structures Small None Present N/A Exudate Amount: Serous N/A N/A Exudate Type: amber N/A N/A Exudate Color: Flat and Intact Flat and Intact N/A Wound Margin: None Present (0%) None Present (0%) N/A Granulation Amount: N/A N/A N/A Granulation Quality: None Present (0%) None Present (0%) N/A Necrotic Amount: Fascia: No Fascia: No N/A Exposed Structures: Fat Layer (Subcutaneous Tissue): No Fat Layer (Subcutaneous Tissue): No Tendon: No Tendon: No Muscle: No Muscle: No Joint: No Joint: No Bone: No Bone: No Limited to Skin Breakdown Large (67-100%) Large (67-100%) N/A Epithelialization: N/A N/A N/A Procedures Performed: Treatment Notes Electronic Signature(s) Signed: 07/07/2020 5:22:30 PM  By: Baltazar Najjar MD Signed: 07/07/2020 5:43:17 PM By: Erica Mu RN Entered By: Baltazar Najjar on 07/07/2020 10:26:36 -------------------------------------------------------------------------------- Multi-Disciplinary Care Plan Details Patient Name: Date of Service: Erica Buff K. 07/07/2020 8:45 A M Medical Record  Number: [id number] Patient Account Number: [id number] Date of Birth/Sex: Treating RN: April 09, 1971 (49 y.o. [name], [name] Primary Care Mialee Weyman: [name] Other Clinician: Referring Lylie Blacklock: Treating Lanie Schelling/Extender: [name] in Treatment: 3 Active Inactive Wound/Skin Impairment Nursing Diagnoses: Impaired tissue integrity Knowledge deficit related to smoking impact on wound healing Goals: Patient will have a decrease in wound volume by X% from date: (specify in notes) Date Initiated: 06/16/2020 Target Resolution Date: 07/17/2020 Goal Status: Active Patient/caregiver will verbalize understanding of skin care regimen Date Initiated: 06/16/2020 Target Resolution Date: 07/17/2020 Goal Status: Active Ulcer/skin breakdown will have a volume reduction of 30% by week 4 Date Initiated: 06/16/2020 Target Resolution Date: 07/17/2020 Goal Status: Active Interventions: Assess patient/caregiver ability to obtain necessary supplies Assess patient/caregiver ability to perform ulcer/skin care regimen upon admission and as needed Assess ulceration(s) every visit Notes: Electronic Signature(s) Signed: 07/07/2020 5:43:17 PM By: Erica Mu RN Entered By: [name] on 07/07/2020 09:44:59 -------------------------------------------------------------------------------- Pain Assessment Details Patient Name: Date of Service: Erica Napoleon. 07/07/2020 8:45 A M Medical Record Number: [id number] Patient Account Number: [id number] Date of Birth/Sex: Treating RN: 1970/07/14 (49 y.o. Tommye Macias Primary Care Searra Carnathan: [name] Other Clinician: Referring Sunya Humbarger: Treating Kelbi Renstrom/Extender: [name] in Treatment: 3 Active Problems Location of Pain Severity and Description of Pain Patient Has Paino Yes Site Locations Pain Location: Generalized Pain With Dressing Change: No Duration of the  Pain. Constant / Intermittento Constant Rate the pain. Current Pain Level: 9 Worst Pain Level: 9 Character of Pain Describe the Pain: Aching Pain Management and Medication Current Pain Management: Medication: Yes Is the Current Pain Management Adequate: Adequate How does your wound impact your activities of daily livingo Sleep: Yes Bathing: No Appetite: No Relationship With Others: No Bladder Continence: No Emotions: No Bowel Continence: No Work: No Toileting: No Drive: No Dressing: No Hobbies: No Notes reports discomfort at bed of ankle from wrap Electronic Signature(s) Signed: 07/07/2020 5:38:13 PM By: Erica Deed RN, BSN Entered By: [name] on 07/07/2020 09:08:54 -------------------------------------------------------------------------------- Patient/Caregiver Education Details Patient Name: Date of Service: Erica Napoleon 2/22/2022andnbsp8:45 A M Medical Record Number: [id number] Patient Account Number: [id number] Date of Birth/Gender: Treating RN: [birth year] (49 y.o. [name], [name] Primary Care Physician: [name] Other Clinician: Referring Physician: Treating Physician/Extender: [name] in Treatment: 3 Education Assessment Education Provided To: Patient Education Topics Provided Basic Hygiene: Methods: Explain/Verbal Responses: Reinforcements needed Wound/Skin Impairment: Methods: Explain/Verbal Responses: State content correctly Electronic Signature(s) Signed: 07/07/2020 5:43:17 PM By: Erica Mu RN Entered By: [name] on 07/07/2020 09:45:25 -------------------------------------------------------------------------------- Wound Assessment Details Patient Name: Date of Service: Erica Napoleon. 07/07/2020 8:45 A M Medical Record Number: [id number] Patient Account Number: [id number] Date of Birth/Sex: Treating RN: 1970-12-27 (49 y.o. Tommye Macias Primary Care Theophilus Walz:  [name] Other Clinician: Referring Willodene Stallings: Treating Nayellie Sanseverino/Extender: [name] in Treatment: 3 Wound Status Wound Number: 1 Primary Venous Leg Ulcer Etiology: Wound Location: Left, Anterior Lower Leg Wound Open Wounding Event: Gradually Appeared Status: Date Acquired: 05/18/2020 Comorbid Lymphedema, Sleep Apnea, Hypertension, Peripheral Venous Weeks Of Treatment: 3 History: Disease, Osteoarthritis, Confinement Anxiety Clustered Wound: No Wound Measurements Length: (cm) 0.6 Width: (cm) 0.7 Depth: (cm) 0.1 Area: (cm) 0.33  Volume: (cm) 0.033 % Reduction in Area: 98.9% % Reduction in Volume: 98.9% Epithelialization: Medium (34-66%) Tunneling: No Undermining: No Wound Description Classification: Full Thickness Without Exposed Support Structures Wound Margin: Flat and Intact Exudate Amount: Medium Exudate Type: Serosanguineous Exudate Color: red, brown Foul Odor After Cleansing: No Slough/Fibrino No Wound Bed Granulation Amount: Large (67-100%) Exposed Structure Granulation Quality: Red Fascia Exposed: No Necrotic Amount: None Present (0%) Fat Layer (Subcutaneous Tissue) Exposed: Yes Tendon Exposed: No Muscle Exposed: No Joint Exposed: No Bone Exposed: No Electronic Signature(s) Signed: 07/08/2020 5:04:01 PM By: Erica Mu RN Signed: 07/08/2020 5:21:35 PM By: Erica Deed RN, BSN Previous Signature: 07/07/2020 5:38:13 PM Version By: Erica Deed RN, BSN Entered By: [name] on 07/08/2020 13:07:55 -------------------------------------------------------------------------------- Wound Assessment Details Patient Name: Date of Service: Erica Buff K. 07/07/2020 8:45 A M Medical Record Number: [id number] Patient Account Number: [id number] Date of Birth/Sex: Treating RN: 1970-10-20 (49 y.o. Tommye Macias Primary Care Chelli Yerkes: [name] Other Clinician: Referring Tonette Koehne: Treating  Nation Cradle/Extender: [name] in Treatment: 3 Wound Status Wound Number: 3 Primary Venous Leg Ulcer Etiology: Wound Location: Left, Proximal, Lateral Lower Leg Wound Healed - Epithelialized Wounding Event: Gradually Appeared Status: Date Acquired: 05/18/2020 Comorbid Lymphedema, Sleep Apnea, Hypertension, Peripheral Venous Weeks Of Treatment: 3 History: Disease, Osteoarthritis, Confinement Anxiety Clustered Wound: No Wound Measurements Length: (cm) Width: (cm) Depth: (cm) Area: (cm) Volume: (cm) 0 % Reduction in Area: 100% 0 % Reduction in Volume: 100% 0 Epithelialization: Large (67-100%) 0 Tunneling: No 0 Undermining: No Wound Description Classification: Full Thickness Without Exposed Support Structures Wound Margin: Flat and Intact Exudate Amount: None Present Foul Odor After Cleansing: No Slough/Fibrino No Wound Bed Granulation Amount: None Present (0%) Exposed Structure Necrotic Amount: None Present (0%) Fascia Exposed: No Fat Layer (Subcutaneous Tissue) Exposed: No Tendon Exposed: No Muscle Exposed: No Joint Exposed: No Bone Exposed: No Limited to Skin Breakdown Electronic Signature(s) Signed: 07/07/2020 5:38:13 PM By: Erica Deed RN, BSN Entered By: [name] on 07/07/2020 09:13:07 -------------------------------------------------------------------------------- Wound Assessment Details Patient Name: Date of Service: Erica Buff K. 07/07/2020 8:45 A M Medical Record Number: [id number] Patient Account Number: [id number] Date of Birth/Sex: Treating RN: Dec 18, 1970 (49 y.o. Tommye Macias Primary Care Dimetrius Montfort: [name] Other Clinician: Referring Rufus Beske: Treating Jazzmyne Rasnick/Extender: [name] in Treatment: 3 Wound Status Wound Number: 5 Primary Venous Leg Ulcer Etiology: Wound Location: Right, Anterior Lower Leg Wound Open Wounding Event: Gradually Appeared Status: Date  Acquired: 05/18/2020 Comorbid Lymphedema, Sleep Apnea, Hypertension, Peripheral Venous Weeks Of Treatment: 3 History: Disease, Osteoarthritis, Confinement Anxiety Clustered Wound: No Wound Measurements Length: (cm) 0.1 Width: (cm) 0.1 Depth: (cm) 0.1 Area: (cm) 0.008 Volume: (cm) 0.001 % Reduction in Area: 97.7% % Reduction in Volume: 97.1% Epithelialization: Large (67-100%) Tunneling: No Undermining: No Wound Description Classification: Full Thickness Without Exposed Support Structures Wound Margin: Flat and Intact Exudate Amount: Medium Exudate Type: Serous Exudate Color: amber Foul Odor After Cleansing: No Slough/Fibrino No Wound Bed Granulation Amount: None Present (0%) Exposed Structure Necrotic Amount: None Present (0%) Fascia Exposed: No Fat Layer (Subcutaneous Tissue) Exposed: No Tendon Exposed: No Muscle Exposed: No Joint Exposed: No Bone Exposed: No Electronic Signature(s) Signed: 07/08/2020 5:04:01 PM By: Erica Mu RN Signed: 07/08/2020 5:21:35 PM By: Erica Deed RN, BSN Previous Signature: 07/07/2020 5:38:13 PM Version By: Erica Deed RN, BSN Previous Signature: 07/07/2020 5:43:17 PM Version By: Erica Mu RN Entered By: [name] on 07/08/2020 13:08:06 -------------------------------------------------------------------------------- Wound Assessment Details Patient Name: Date of  Service: Erica VERLEAN, NAVIDAD 07/07/2020 8:45 A M Medical Record Number: [id number] Patient Account Number: [id number] Date of Birth/Sex: Treating RN: 11/05/1970 (49 y.o. Tommye Macias Primary Care Cannie Muckle: [name] Other Clinician: Referring Alenah Sarria: Treating Deaire Mcwhirter/Extender: [name] in Treatment: 3 Wound Status Wound Number: 6 Primary Venous Leg Ulcer Etiology: Wound Location: Right, Medial Lower Leg Wound Open Wounding Event: Gradually Appeared Status: Date Acquired: 05/18/2020 Comorbid Lymphedema,  Sleep Apnea, Hypertension, Peripheral Venous Weeks Of Treatment: 3 History: Disease, Osteoarthritis, Confinement Anxiety Clustered Wound: No Wound Measurements Length: (cm) Width: (cm) Depth: (cm) Area: (cm) Volume: (cm) 0 % Reduction in Area: 100% 0 % Reduction in Volume: 100% 0 Epithelialization: Large (67-100%) 0 Tunneling: No 0 Undermining: No Wound Description Classification: Full Thickness Without Exposed Support Structures Wound Margin: Flat and Intact Exudate Amount: Small Exudate Type: Serous Exudate Color: amber Foul Odor After Cleansing: No Slough/Fibrino No Wound Bed Granulation Amount: None Present (0%) Exposed Structure Necrotic Amount: None Present (0%) Fascia Exposed: No Fat Layer (Subcutaneous Tissue) Exposed: No Tendon Exposed: No Muscle Exposed: No Joint Exposed: No Bone Exposed: No Electronic Signature(s) Signed: 07/07/2020 5:38:13 PM By: Erica Deed RN, BSN Entered By: [name] on 07/07/2020 09:13:45 -------------------------------------------------------------------------------- Wound Assessment Details Patient Name: Date of Service: Erica Buff K. 07/07/2020 8:45 A M Medical Record Number: [id number] Patient Account Number: [id number] Date of Birth/Sex: Treating RN: 07-Aug-1970 (49 y.o. Tommye Macias Primary Care Maurisio Ruddy: [name] Other Clinician: Referring Kyros Salzwedel: Treating Jaylie Neaves/Extender: [name] in Treatment: 3 Wound Status Wound Number: 8 Primary Lymphedema Etiology: Wound Location: Right, Posterior Lower Leg Wound Healed - Epithelialized Wounding Event: Gradually Appeared Status: Date Acquired: 06/30/2020 Comorbid Lymphedema, Sleep Apnea, Hypertension, Peripheral Venous Weeks Of Treatment: 1 History: Disease, Osteoarthritis, Confinement Anxiety Clustered Wound: No Wound Measurements Length: (cm) Width: (cm) Depth: (cm) Area: (cm) Volume: (cm) 0 % Reduction in  Area: 100% 0 % Reduction in Volume: 100% 0 Epithelialization: Large (67-100%) 0 Tunneling: No 0 Undermining: No Wound Description Classification: Partial Thickness Wound Margin: Flat and Intact Exudate Amount: None Present Foul Odor After Cleansing: No Slough/Fibrino No Wound Bed Granulation Amount: None Present (0%) Exposed Structure Necrotic Amount: None Present (0%) Fascia Exposed: No Fat Layer (Subcutaneous Tissue) Exposed: No Tendon Exposed: No Muscle Exposed: No Joint Exposed: No Bone Exposed: No Limited to Skin Breakdown Electronic Signature(s) Signed: 07/07/2020 5:38:13 PM By: Erica Deed RN, BSN Entered By: [name] on 07/07/2020 09:14:07 -------------------------------------------------------------------------------- Vitals Details Patient Name: Date of Service: Erica Buff K. 07/07/2020 8:45 A M Medical Record Number: [id number] Patient Account Number: [id number] Date of Birth/Sex: Treating RN: Aug 28, 1970 (49 y.o. Tommye Macias Primary Care Venisa Frampton: [name] Other Clinician: Referring Crystle Carelli: Treating Christohper Dube/Extender: [name] in Treatment: 3 Vital Signs Time Taken: 09:05 Temperature (F): 98.4 Height (in): 62 Pulse (bpm): 92 Source: Stated Respiratory Rate (breaths/min): 18 Weight (lbs): 304 Blood Pressure (mmHg): 113/71 Source: Stated Capillary Blood Glucose (mg/dl): 962 Body Mass Index (BMI): 55.6 Reference Range: 80 - 120 mg / dl Notes glucose per pt report yesterday Electronic Signature(s) Signed: 07/07/2020 5:38:13 PM By: Erica Deed RN, BSN Entered By: [name] on 07/07/2020 09:07:17

## 2020-07-08 DIAGNOSIS — I87333 Chronic venous hypertension (idiopathic) with ulcer and inflammation of bilateral lower extremity: Secondary | ICD-10-CM | POA: Diagnosis not present

## 2020-07-08 DIAGNOSIS — I872 Venous insufficiency (chronic) (peripheral): Secondary | ICD-10-CM | POA: Diagnosis not present

## 2020-07-09 ENCOUNTER — Other Ambulatory Visit: Payer: Self-pay | Admitting: Family Medicine

## 2020-07-09 ENCOUNTER — Other Ambulatory Visit: Payer: Self-pay

## 2020-07-09 ENCOUNTER — Ambulatory Visit (HOSPITAL_COMMUNITY)
Admission: EM | Admit: 2020-07-09 | Discharge: 2020-07-09 | Disposition: A | Discharge status: Home / Self Care | Payer: Medicare Other

## 2020-07-09 ENCOUNTER — Encounter (HOSPITAL_COMMUNITY): Payer: Self-pay

## 2020-07-09 ENCOUNTER — Encounter (HOSPITAL_BASED_OUTPATIENT_CLINIC_OR_DEPARTMENT_OTHER): Payer: Medicare Other | Admitting: Internal Medicine

## 2020-07-09 DIAGNOSIS — H9202 Otalgia, left ear: Secondary | ICD-10-CM

## 2020-07-09 DIAGNOSIS — L97821 Non-pressure chronic ulcer of other part of left lower leg limited to breakdown of skin: Secondary | ICD-10-CM | POA: Diagnosis not present

## 2020-07-09 DIAGNOSIS — M300 Polyarteritis nodosa: Secondary | ICD-10-CM | POA: Diagnosis not present

## 2020-07-09 DIAGNOSIS — I87333 Chronic venous hypertension (idiopathic) with ulcer and inflammation of bilateral lower extremity: Secondary | ICD-10-CM | POA: Diagnosis not present

## 2020-07-09 DIAGNOSIS — S01312A Laceration without foreign body of left ear, initial encounter: Secondary | ICD-10-CM | POA: Diagnosis not present

## 2020-07-09 DIAGNOSIS — I890 Lymphedema, not elsewhere classified: Secondary | ICD-10-CM | POA: Diagnosis not present

## 2020-07-09 DIAGNOSIS — L97811 Non-pressure chronic ulcer of other part of right lower leg limited to breakdown of skin: Secondary | ICD-10-CM | POA: Diagnosis not present

## 2020-07-09 MED ORDER — HYDROCODONE-ACETAMINOPHEN 10-325 MG PO TABS: 1.0000 | ORAL_TABLET | Freq: Four times a day (QID) | ORAL | 0 refills | Status: DC | PRN

## 2020-07-09 MED ORDER — FERROUS SULFATE 325 (65 FE) MG PO TABS: 325.0000 mg | ORAL_TABLET | Freq: Every day | ORAL | 0 refills | Status: DC

## 2020-07-09 MED ORDER — GABAPENTIN 600 MG PO TABS: 600.0000 mg | ORAL_TABLET | Freq: Three times a day (TID) | ORAL | 0 refills | Status: DC

## 2020-07-09 MED ORDER — HYDROCODONE 10 MG-ACETAMINOPHEN 325 MG TABLET
ORAL | 0.00000 days
Start: 2020-07-09 — End: ?

## 2020-07-09 NOTE — Telephone Encounter (Signed)
Requesting:Norco 10-325mg  Contract: UDS: Last Visit:05/19/20 Next Visit:08/19/20 Last Refill:06/15/20 90 tabs 0 refills Please Advise

## 2020-07-09 NOTE — Progress Notes (Signed)
[name], [name] ([id number]) Visit Report for 07/09/2020 SuperBill Details Patient Name: Date of Service: [name], [name] 07/09/2020 Medical Record Number: [id number] Patient Account Number: [id number] Date of Birth/Sex: Treating RN: 1971/05/13 (50 y.o. 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

## 2020-07-09 NOTE — ED Triage Notes (Addendum)
Pt c/o trip and fall this morning with laceration to the left ear. Pt denies hitting head or LOC. Pt does have limited mobility/walking difficulty. Pt denies any other injuries. Pt was at the wound center clinic and staff there did evaluate her and advised she may need sutures.

## 2020-07-09 NOTE — ED Provider Notes (Signed)
[name]   MRN: [id number] DOB: 1971/03/12  Subjective:   [name] is a 50 y.o. female presenting for suffering a left ear laceration today.  Patient states that she tripped while walking through the door and actually fell into the door itself.  She collided with the left side of her head first causing the laceration to her ear.  Patient cleaned her wound quite well, applied some Steri-Strips.  Denies loss of consciousness, fall to the ground, head injury, confusion, dizziness.  Last Tdap was updated 2 years ago.  No current facility-administered medications for this encounter.  Current Outpatient Medications:  .  ALPRAZolam (XANAX) 0.5 MG tablet, Take 1 tablet (0.5 mg total) by mouth 2 (two) times daily as needed for anxiety., Disp: 30 tablet, Rfl: 0 .  amoxicillin (AMOXIL) 875 MG tablet, Take 1 tablet (875 mg total) by mouth 2 (two) times daily., Disp: 20 tablet, Rfl: 0 .  ARIPiprazole (ABILIFY) 10 MG tablet, Take 10 mg by mouth in the morning. , Disp: , Rfl:  .  Armodafinil 250 MG tablet, 3 (three) times daily. 8am, 2pm, 6pm, Disp: , Rfl:  .  Biotin 10 MG TABS, Take 10 mg by mouth in the morning. , Disp: , Rfl:  .  buPROPion (WELLBUTRIN XL) 300 MG 24 hr tablet, TAKE 1 TABLET BY MOUTH EVERY DAY, Disp: 30 tablet, Rfl: 3 .  busPIRone (BUSPAR) 30 MG tablet, TAKE 1 TABLET BY MOUTH 2 TIMES DAILY, Disp: 60 tablet, Rfl: 3 .  calcium citrate (CALCITRATE - DOSED IN MG ELEMENTAL CALCIUM) 950 MG tablet, Take 1 tablet by mouth daily., Disp: , Rfl:  .  ciprofloxacin (CIPRO) 500 MG tablet, Take 1 tablet (500 mg total) by mouth 2 (two) times daily., Disp: 14 tablet, Rfl: 0 .  clobetasol cream (TEMOVATE) 9.52 %, Apply 1 application topically 2 (two) times daily. Cover both legs with Colbetasol cream, Disp: 30 g, Rfl: 0 .  clonazePAM (KLONOPIN) 1 MG tablet, Take 1 tablet (1 mg total) by mouth at bedtime., Disp: 30 tablet, Rfl: 3 .  collagenase (SANTYL) ointment, Apply topically  daily. apply Santyl to scabs on legs, Disp: 15 g, Rfl: 0 .  cyclobenzaprine (FLEXERIL) 10 MG tablet, TAKE 1 TABLET BY MOUTH 3 TIMES DAILY AS NEEDED, Disp: 270 tablet, Rfl: 0 .  DULoxetine (CYMBALTA) 60 MG capsule, Take 2 capsules (120 mg total) by mouth daily., Disp: 240 capsule, Rfl: 2 .  ferrous sulfate (FEROSUL) 325 (65 FE) MG tablet, Take 1 tablet (325 mg total) by mouth daily with breakfast., Disp: 30 tablet, Rfl: 0 .  fluconazole (DIFLUCAN) 100 MG tablet, Take 1 tablet (100 mg total) by mouth daily., Disp: 7 tablet, Rfl: 0 .  furosemide (LASIX) 80 MG tablet, TAKE 1 TABLET BY MOUTH DAILY. can TAKE 1 extra TABLET IF weight INCREASE by 5lb AND call your doctor FOR further instruction, Disp: 90 tablet, Rfl: 0 .  gabapentin (NEURONTIN) 600 MG tablet, Take 1 tablet (600 mg total) by mouth 3 (three) times daily. TAKE 2 TABLETS BY MOUTH 3 TIMES DAILY, Disp: 270 tablet, Rfl: 0 .  HYDROcodone-acetaminophen (NORCO) 10-325 MG tablet, Take 1 tablet by mouth every 6 (six) hours as needed., Disp: 90 tablet, Rfl: 0 .  irbesartan (AVAPRO) 75 MG tablet, TAKE 1 TABLET BY MOUTH EVERY DAY, Disp: 30 tablet, Rfl: 0 .  meclizine (ANTIVERT) 25 MG tablet, Take 1 tablet (25 mg total) by mouth 3 (three) times daily as needed for dizziness., Disp:  30 tablet, Rfl: 0 .  mupirocin ointment (BACTROBAN) 2 %, APPLY TOPICALLY 2 TIMES DAILY, Disp: 22 g, Rfl: 1 .  nebivolol (BYSTOLIC) 10 MG tablet, TAKE 1 TABLET BY MOUTH EVERY DAY, Disp: 30 tablet, Rfl: 3 .  nystatin (MYCOSTATIN/NYSTOP) powder, Apply 1 application topically 3 (three) times daily., Disp: 60 g, Rfl: 3 .  Oxcarbazepine (TRILEPTAL) 300 MG tablet, Take 300 mg by mouth 2 (two) times daily., Disp: , Rfl:  .  pantoprazole (PROTONIX) 40 MG tablet, TAKE 1 TABLET BY MOUTH 2 TIMES DAILY, Disp: 60 tablet, Rfl: 0 .  pentoxifylline (TRENTAL) 400 MG CR tablet, Take 400 mg by mouth 3 (three) times daily with meals. , Disp: , Rfl: 11 .  promethazine (PHENERGAN) 25 MG tablet, TAKE 1  TABLET BY MOUTH EVERY 6 HOURS AS NEEDED FOR NAUSEA AND VOMITING, Disp: 45 tablet, Rfl: 3 .  rosuvastatin (CRESTOR) 20 MG tablet, TAKE 1 TABLET BY MOUTH EVERY DAY, Disp: 30 tablet, Rfl: 0 .  SKYRIZI, 150 MG DOSE, 75 MG/0.83ML PSKT, Inject 150 mg into the skin every 3 (three) months. , Disp: , Rfl:  .  SUMAtriptan (IMITREX) 100 MG tablet, Take 1 tablet (100 mg total) by mouth every 2 (two) hours as needed for migraine. May repeat in 2 hours if headache persists or recurs., Disp: 10 tablet, Rfl: 1 .  senna-docusate (SENOKOT-S) 8.6-50 MG tablet, Take 1 tablet by mouth at bedtime. (Patient not taking: Reported on 03/17/2020), Disp: 30 tablet, Rfl: 0   Allergies  Allergen Reactions  . Niacin Anaphylaxis, Swelling and Other (See Comments)    REACTION: Swelling, problems breathing  . Sulfamethoxazole-Trimethoprim Anaphylaxis, Swelling, Rash and Other (See Comments)    REACTION: Rash, throat closed, eyes swelled.  . Aspirin Hives  . Bactrim Hives  . Benzoin Compound Rash  . Cephalexin Hives    Tolerated ceftriaxone and cefepime 07/2019  . Clindamycin/Lincomycin Hives  . Doxycycline Hives  . Iohexol Hives    Pt treated with PO benedryl  . Lisinopril Hives  . Naproxen Hives  . Pnu-Imune [Pneumococcal Polysaccharide Vaccine] Rash  . Sulfonamide Derivatives Rash    Past Medical History:  Diagnosis Date  . Allergic rhinitis   . Ankle fracture, right   . Anxiety   . ASCUS of cervix with negative high risk HPV 05/2018  . Carpal tunnel syndrome   . Cervical cancer ([place]) 1998  . Fibromyalgia   . Hyperlipidemia   . Hypertension   . Migraine headache   . Obesity   . Sleep apnea      Past Surgical History:  Procedure Laterality Date  . ABDOMINAL HYSTERECTOMY  10/1996  . ABDOMINAL SURGERY  jan/11/2012   gastric bypass surgery  . CARPAL TUNNEL RELEASE     bilateral   . CESAREAN SECTION    . CHOLECYSTECTOMY  07/1992  . ESOPHAGOGASTRODUODENOSCOPY N/A 01/14/2016   Procedure:  ESOPHAGOGASTRODUODENOSCOPY (EGD);  Surgeon: Mauri Pole, MD;  Location: [facility] ENDOSCOPY;  Service: Endoscopy;  Laterality: N/A;    Family History  Problem Relation Age of Onset  . Diabetes Mother   . Emphysema Mother   . Allergies Mother   . Hypertension Mother   . Heart disease Father   . Allergies Father   . Prostate cancer Father   . Breast cancer Maternal Grandmother 58    Social History   Tobacco Use  . Smoking status: Never Smoker  . Smokeless tobacco: Never Used  Vaping Use  . Vaping Use: Never used  Substance Use Topics  .  Alcohol use: No  . Drug use: No    ROS   Objective:   Vitals: BP (!) 149/87 (BP Location: Left Wrist)   Pulse 86   Temp 98.3 F (36.8 C) (Oral)   Resp 18   Ht 5\' 2"  (1.575 m)   Wt (!) 339 lb (153.8 kg)   SpO2 98%   BMI 62.00 kg/m   Physical Exam Constitutional:      General: She is not in acute distress.    Appearance: Normal appearance. She is well-developed. She is not ill-appearing, toxic-appearing or diaphoretic.  HENT:     Head: Normocephalic and atraumatic.     Ears:      Nose: Nose normal.     Mouth/Throat:     Mouth: Mucous membranes are moist.     Pharynx: Oropharynx is clear.  Eyes:     General: No scleral icterus.    Extraocular Movements: Extraocular movements intact.     Pupils: Pupils are equal, round, and reactive to light.  Cardiovascular:     Rate and Rhythm: Normal rate.  Pulmonary:     Effort: Pulmonary effort is normal.  Skin:    General: Skin is warm and dry.  Neurological:     General: No focal deficit present.     Mental Status: She is alert and oriented to person, place, and time.  Psychiatric:        Mood and Affect: Mood normal.        Behavior: Behavior normal.        Thought Content: Thought content normal.        Judgment: Judgment normal.    Wound cleansed using the wound cleaner.  No bleeding noted.  Approximated using Dermabond.  Secured with nonadherent pressure dressing using  paper tape.  Assessment and Plan :   PDMP not reviewed this encounter.  1. Left ear pain   2. Laceration of auricle of left ear, initial encounter     Successful laceration repair.  Counseled on general wound care.  Patient may apply Steri-Strips as needed as the Dermabond wears off.  Tdap is already updated. Counseled patient on potential for adverse effects with medications prescribed/recommended today, ER and return-to-clinic precautions discussed, patient verbalized understanding.    Jaynee Eagles, PA-C 07/09/20 1332

## 2020-07-10 ENCOUNTER — Telehealth: Payer: Self-pay | Admitting: Family Medicine

## 2020-07-10 ENCOUNTER — Other Ambulatory Visit: Payer: Self-pay | Admitting: Family Medicine

## 2020-07-10 ENCOUNTER — Other Ambulatory Visit: Payer: Self-pay

## 2020-07-10 DIAGNOSIS — U071 COVID-19: Secondary | ICD-10-CM

## 2020-07-10 DIAGNOSIS — B369 Superficial mycosis, unspecified: Secondary | ICD-10-CM

## 2020-07-10 MED ORDER — FLUCONAZOLE 100 MG PO TABS: 100.0000 mg | ORAL_TABLET | Freq: Every day | ORAL | 0 refills | Status: DC

## 2020-07-10 NOTE — Telephone Encounter (Signed)
Patient Rx sent to pharmacy

## 2020-07-10 NOTE — Progress Notes (Signed)
[name], [name] ([id number]) Visit Report for 07/09/2020 Arrival Information Details Patient Name: Date of Service: [name], [name] 07/09/2020 10:15 A M Medical Record Number: [id number] Patient Account Number: [id number] Date of Birth/Sex: Treating RN: [birth year] (50 y.o. [name], Meta.Reding Primary Care Provider: [name] Other Clinician: Referring Provider: Treating Provider/Extender: [name] in Treatment: 3 Visit Information History Since Last Visit All ordered tests and consults were completed: No Patient Arrived: [name] Added or deleted any medications: No Arrival Time: 10:48 Any new allergies or adverse reactions: No Accompanied By: self Had a fall or experienced change in Yes Transfer Assistance: None activities of daily living that may affect Patient Identification Verified: Yes risk of falls: Secondary Verification Process Completed: Yes Signs or symptoms of abuse/neglect since last visito No Patient Requires Transmission-Based Precautions: No Hospitalized since last visit: No Patient Has Alerts: No Implantable device outside of the clinic excluding No cellular tissue based products placed in the center since last visit: Has Dressing in Place as Prescribed: Yes Pain Present Now: No Notes Patient had a fall coming into the office. patient stated she shoes are two sizes to big and she tripped and hit her ear. The doctor advised her to go to the ER or Urgent care. Patient states she will go to the urgent care when she leaves. Electronic Signature(s) Signed: 07/10/2020 1:28:50 PM By: Sandre Kitty Entered By: Sandre Kitty on 07/09/2020 10:50:19 -------------------------------------------------------------------------------- Compression Therapy Details Patient Name: Date of Service: [name], [name] 07/09/2020 10:15 A M Medical Record Number: [id number] Patient Account Number: [id number] Date of Birth/Sex: Treating RN: Mar 01, 1971  (50 y.o. [name] Primary Care Provider: [name] Other Clinician: Referring Provider: Treating Provider/Extender: [name] in Treatment: 3 Compression Therapy Performed for Wound Assessment: Wound #1 Left,Anterior Lower Leg Performed By: Clinician Baruch Gouty, RN Compression Type: Four Layer Electronic Signature(s) Signed: 07/09/2020 5:40:23 PM By: Baruch Gouty RN, BSN Entered By: Baruch Gouty on 07/09/2020 11:30:21 -------------------------------------------------------------------------------- Encounter Discharge Information Details Patient Name: Date of Service: Erica Devoid K. 07/09/2020 10:15 A M Medical Record Number: [id number] Patient Account Number: [id number] Date of Birth/Sex: Treating RN: 1971/04/26 (50 y.o. [name] Primary Care Provider: [name] Other Clinician: Referring Provider: Treating Provider/Extender: [name] in Treatment: 3 Encounter Discharge Information Items Discharge Condition: Stable Ambulatory Status: Ambulatory Discharge Destination: Home Transportation: Private Auto Accompanied By: father Schedule Follow-up Appointment: Yes Clinical Summary of Care: Patient Declined Electronic Signature(s) Signed: 07/09/2020 5:40:23 PM By: Baruch Gouty RN, BSN Entered By: Baruch Gouty on 07/09/2020 11:33:48 -------------------------------------------------------------------------------- Patient/Caregiver Education Details Patient Name: Date of Service: [name] 2/24/2022andnbsp10:15 [facility] Record Number: [id number] Patient Account Number: [id number] Date of Birth/Gender: Treating RN: Sep 14, 1970 (50 y.o. [name] Primary Care Physician: [name] Other Clinician: Referring Physician: Treating Physician/Extender: [name] in Treatment: 3 Education Assessment Education Provided  To: Patient Education Topics Provided Venous: Methods: Explain/Verbal Responses: Reinforcements needed, State content correctly Electronic Signature(s) Signed: 07/09/2020 5:40:23 PM By: Baruch Gouty RN, BSN Entered By: Baruch Gouty on 07/09/2020 11:33:28 -------------------------------------------------------------------------------- Wound Assessment Details Patient Name: Date of Service: Erica Devoid K. 07/09/2020 10:15 A M Medical Record Number: [id number] Patient Account Number: [id number] Date of Birth/Sex: Treating RN: 1970/08/04 (50 y.o. [name] Primary Care Provider: Other Clinician: [name] Referring Provider: Treating Provider/Extender: [name] in Treatment: 3 Wound Status Wound Number: 1 Primary Venous Leg Ulcer Etiology:  Wound Location: Left, Anterior Lower Leg Wound Open Wounding Event: Gradually Appeared Status: Date Acquired: 05/18/2020 Comorbid Lymphedema, Sleep Apnea, Hypertension, Peripheral Venous Weeks Of Treatment: 3 History: Disease, Osteoarthritis, Confinement Anxiety Clustered Wound: No Wound Measurements Length: (cm) 0.6 Width: (cm) 0.7 Depth: (cm) 0.1 Area: (cm) 0.33 Volume: (cm) 0.033 % Reduction in Area: 98.9% % Reduction in Volume: 98.9% Epithelialization: Medium (34-66%) Tunneling: No Undermining: No Wound Description Classification: Full Thickness Without Exposed Support Structures Wound Margin: Flat and Intact Exudate Amount: Small Exudate Type: Serosanguineous Exudate Color: red, brown Foul Odor After Cleansing: No Slough/Fibrino No Wound Bed Granulation Amount: Large (67-100%) Exposed Structure Granulation Quality: Red Fascia Exposed: No Necrotic Amount: None Present (0%) Fat Layer (Subcutaneous Tissue) Exposed: Yes Tendon Exposed: No Muscle Exposed: No Joint Exposed: No Bone Exposed: No Treatment Notes Wound #1 (Lower Leg) Wound Laterality: Left,  Anterior Cleanser Peri-Wound Care Topical Primary Dressing KerraCel Ag Gelling Fiber Dressing, 2x2 in (silver alginate) Discharge Instruction: Apply silver alginate to wound bed as instructed Secondary Dressing ABD Pad, 5x9 Discharge Instruction: Apply over primary dressing as directed. Secured With Compression Wrap Compression Stockings Circaid Juxta Lite Compression Wrap Quantity: 1 Left Leg Compression Amount: 30-40 mmHg Discharge Instruction: Apply Circaid Juxta Lite Compression Wrap daily as instructed. Apply first thing in the morning, remove at night before bed. Add-Ons Electronic Signature(s) Signed: 07/09/2020 5:39:27 PM By: Deon Pilling Signed: 07/09/2020 5:40:23 PM By: Baruch Gouty RN, BSN Entered By: Baruch Gouty on 07/09/2020 11:29:58 -------------------------------------------------------------------------------- Wound Assessment Details Patient Name: Date of Service: Erica Devoid K. 07/09/2020 10:15 A M Medical Record Number: [id number] Patient Account Number: [id number] Date of Birth/Sex: Treating RN: 1970-06-11 (50 y.o. [name], Meta.Reding Primary Care Provider: [name] Other Clinician: Referring Provider: Treating Provider/Extender: [name] in Treatment: 3 Wound Status Wound Number: 5 Primary Etiology: Venous Leg Ulcer Wound Location: Right, Anterior Lower Leg Wound Status: Open Wounding Event: Gradually Appeared Date Acquired: 05/18/2020 Weeks Of Treatment: 3 Clustered Wound: No Wound Measurements Length: (cm) 0.1 Width: (cm) 0.1 Depth: (cm) 0.1 Area: (cm) 0.008 Volume: (cm) 0.001 % Reduction in Area: 97.7% % Reduction in Volume: 97.1% Wound Description Classification: Full Thickness Without Exposed Support Structur es Treatment Notes Wound #5 (Lower Leg) Wound Laterality: Right, Anterior Cleanser Peri-Wound Care Topical Primary Dressing KerraCel Ag Gelling Fiber Dressing, 2x2 in (silver  alginate) Discharge Instruction: Apply silver alginate to wound bed as instructed Secondary Dressing ABD Pad, 5x9 Discharge Instruction: Apply over primary dressing as directed. Secured With Compression Wrap Compression Stockings Circaid Juxta Lite Compression Wrap Quantity: 1 Right Leg Compression Amount: 30-40 mmHg Discharge Instruction: Apply Circaid Juxta Lite Compression Wrap daily as instructed. Apply first thing in the morning, remove at night before bed. Add-Ons Electronic Signature(s) Signed: 07/09/2020 5:39:27 PM By: Deon Pilling Signed: 07/10/2020 1:28:50 PM By: Sandre Kitty Entered By: Sandre Kitty on 07/09/2020 10:50:58 -------------------------------------------------------------------------------- Vitals Details Patient Name: Date of Service: Erica Devoid K. 07/09/2020 10:15 A M Medical Record Number: [id number] Patient Account Number: [id number] Date of Birth/Sex: Treating RN: [birth year] (50 y.o. [name], Meta.Reding Primary Care Provider: [name] Other Clinician: Referring Provider: Treating Provider/Extender: [name] in Treatment: 3 Vital Signs Time Taken: 10:50 Temperature (F): 98.3 Height (in): 62 Pulse (bpm): 89 Weight (lbs): 304 Respiratory Rate (breaths/min): 18 Body Mass Index (BMI): 55.6 Blood Pressure (mmHg): 150/83 Capillary Blood Glucose (mg/dl): 89 Reference Range: 80 - 120 mg / dl Electronic Signature(s) Signed: 07/10/2020 1:28:50 PM By: Sandre Kitty Entered By:  Sandre Kitty on 07/09/2020 10:50:45

## 2020-07-10 NOTE — Telephone Encounter (Signed)
..  Medication Refills  Last OV:  Medication:  Diflucan  Pharmacy:  [facility]  Let patient know to contact pharmacy at the end of the day to make sure medication is ready.   Please notify patient to allow 48-72 hours to process.  Encourage patient to contact the pharmacy for refills or they can request refills through [facility] out below:   Last refill:  QTY:  Refill Date:    Other Comments: Patient states that Dr. Birdie Riddle told her if the 1st diflucan didn't completely cure it that she would call in another one.  Okay for refill?  Please advise.

## 2020-07-14 ENCOUNTER — Other Ambulatory Visit: Payer: Self-pay

## 2020-07-14 ENCOUNTER — Encounter (HOSPITAL_BASED_OUTPATIENT_CLINIC_OR_DEPARTMENT_OTHER): Payer: Medicare Other | Attending: Internal Medicine | Admitting: Internal Medicine

## 2020-07-14 DIAGNOSIS — I87333 Chronic venous hypertension (idiopathic) with ulcer and inflammation of bilateral lower extremity: Secondary | ICD-10-CM | POA: Insufficient documentation

## 2020-07-14 DIAGNOSIS — Z6841 Body Mass Index (BMI) 40.0 and over, adult: Secondary | ICD-10-CM | POA: Diagnosis not present

## 2020-07-14 DIAGNOSIS — L97821 Non-pressure chronic ulcer of other part of left lower leg limited to breakdown of skin: Secondary | ICD-10-CM | POA: Insufficient documentation

## 2020-07-14 DIAGNOSIS — L03116 Cellulitis of left lower limb: Secondary | ICD-10-CM | POA: Insufficient documentation

## 2020-07-14 DIAGNOSIS — I87332 Chronic venous hypertension (idiopathic) with ulcer and inflammation of left lower extremity: Secondary | ICD-10-CM | POA: Diagnosis not present

## 2020-07-14 DIAGNOSIS — I890 Lymphedema, not elsewhere classified: Secondary | ICD-10-CM | POA: Insufficient documentation

## 2020-07-14 DIAGNOSIS — L97811 Non-pressure chronic ulcer of other part of right lower leg limited to breakdown of skin: Secondary | ICD-10-CM | POA: Insufficient documentation

## 2020-07-14 DIAGNOSIS — M300 Polyarteritis nodosa: Secondary | ICD-10-CM | POA: Insufficient documentation

## 2020-07-14 NOTE — Progress Notes (Signed)
ELIYANA, PAGLIARO ([id number]) Visit Report for 07/14/2020 Arrival Information Details Patient Name: Date of Service: HA ADIVA, BOETTNER 07/14/2020 8:45 A M Medical Record Number: [id number] Patient Account Number: [id number] Date of Birth/Sex: Treating RN: 1971/01/25 (50 y.o. [name], [name] Primary Care Geneva Pallas: [name] Other Clinician: Referring Bayleigh Loflin: Treating Branston Halsted/Extender: [name] in Treatment: 4 Visit Information History Since Last Visit Added or deleted any medications: No Patient Arrived: Wheel Chair Any new allergies or adverse reactions: No Arrival Time: 08:45 Had a fall or experienced change in No Accompanied By: self activities of daily living that may affect Transfer Assistance: None risk of falls: Patient Identification Verified: Yes Signs or symptoms of abuse/neglect since last visito No Secondary Verification Process Completed: Yes Hospitalized since last visit: No Patient Requires Transmission-Based Precautions: No Implantable device outside of the clinic excluding No Patient Has Alerts: No cellular tissue based products placed in the center since last visit: Has Dressing in Place as Prescribed: Yes Pain Present Now: Yes Electronic Signature(s) Signed: 07/14/2020 5:53:33 PM By: Sandre Kitty Entered By: Sandre Kitty on 07/14/2020 08:46:13 -------------------------------------------------------------------------------- Compression Therapy Details Patient Name: Date of Service: [name] 07/14/2020 8:45 A M Medical Record Number: [id number] Patient Account Number: [id number] Date of Birth/Sex: Treating RN: 06/05/1970 (50 y.o. [name], [name] Primary Care Quiana Cobaugh: [name] Other Clinician: Referring Juliannah Ohmann: Treating Landan Fedie/Extender: [name] in Treatment: 4 Compression Therapy Performed for Wound Assessment: Wound #1 Left,Anterior Lower Leg Performed By:  Clinician [name], RN Compression Type: Four Layer Post Procedure Diagnosis Same as Pre-procedure Electronic Signature(s) Signed: 07/14/2020 5:58:27 PM By: [name] RN Entered By: [name] on 07/14/2020 09:35:02 -------------------------------------------------------------------------------- Encounter Discharge Information Details Patient Name: Date of Service: [name] 07/14/2020 8:45 A M Medical Record Number: [id number] Patient Account Number: [id number] Date of Birth/Sex: Treating RN: [birth year] (50 y.o. [name] Primary Care Kenijah Benningfield: [name] Other Clinician: Referring [place]: Treating Lesleyann Fichter/Extender: [name] in Treatment: 4 Encounter Discharge Information Items Discharge Condition: Stable Ambulatory Status: Cane Discharge Destination: Home Transportation: Private Auto Accompanied By: self Schedule Follow-up Appointment: Yes Clinical Summary of Care: Electronic Signature(s) Signed: 07/14/2020 1:49:11 PM By: Deon Pilling Entered By: Deon Pilling on 07/14/2020 10:17:50 -------------------------------------------------------------------------------- Lower Extremity Assessment Details Patient Name: Date of Service: HA CAITRIN, PENDERGRAPH 07/14/2020 8:45 A M Medical Record Number: [id number] Patient Account Number: [id number] Date of Birth/Sex: Treating RN: 04/21/1971 (50 y.o. [name], [name] Primary Care Klaira Pesci: [name] Other Clinician: Referring Iyad Deroo: Treating Osamu Olguin/Extender: [name] in Treatment: 4 Edema Assessment Assessed: Shirlyn Goltz: No] Patrice Paradise: No] Edema: [Left: Yes] [Right: Yes] Calf Left: Right: Point of Measurement: 28 cm From Medial Instep 39 cm 41.2 cm Ankle Left: Right: Point of Measurement: 11 cm From Medial Instep 26.9 cm 26 cm Vascular Assessment Pulses: Dorsalis Pedis Palpable: [Left:Yes] [Right:Yes] Posterior  Tibial Palpable: [Left:Yes] [Right:Yes] Electronic Signature(s) Signed: 07/14/2020 5:58:27 PM By: [name] RN Entered By: [name] on 07/14/2020 09:24:52 -------------------------------------------------------------------------------- Multi Wound Chart Details Patient Name: Date of Service: [name] 07/14/2020 8:45 A M Medical Record Number: [id number] Patient Account Number: [id number] Date of Birth/Sex: Treating RN: 12/26/1970 (50 y.o. [name], [name] Primary Care Amonie Wisser: [name] Other Clinician: Referring Leianne Callins: Treating Deeya Richeson/Extender: [name] in Treatment: 4 Vital Signs Height(in): 62 Capillary Blood Glucose(mg/dl): 89 Weight(lbs): 304 Pulse(bpm): 81 Body Mass Index(BMI): 56 Blood Pressure(mmHg): 134/65 Temperature(F): 98.1 Respiratory Rate(breaths/min): 18 Photos: [1:No  Photos Left, Anterior Lower Leg] [5:No Photos Right, Anterior Lower Leg] [N/A:N/A N/A] Wound Location: [1:Gradually Appeared] [5:Gradually Appeared] [N/A:N/A] Wounding Event: [1:Venous Leg Ulcer] [5:Venous Leg Ulcer] [N/A:N/A] Primary Etiology: [1:Lymphedema, Sleep Apnea,] [5:Lymphedema, Sleep Apnea,] [N/A:N/A] Comorbid History: [1:Hypertension, Peripheral Venous Disease, Osteoarthritis, Confinement Anxiety 05/18/2020] [5:Hypertension, Peripheral Venous Disease, Osteoarthritis, Confinement Anxiety 05/18/2020] [N/A:N/A] Date Acquired: [1:4] [5:4] [N/A:N/A] Weeks of Treatment: [1:Open] [5:Healed - Epithelialized] [N/A:N/A] Wound Status: [1:0.1x0.1x0.1] [5:0x0x0] [N/A:N/A] Measurements L x W x D (cm) [1:0.008] [5:0] [N/A:N/A] A (cm) : rea [1:0.001] [5:0] [N/A:N/A] Volume (cm) : [1:100.00%] [5:100.00%] [N/A:N/A] % Reduction in Area: [1:100.00%] [5:100.00%] [N/A:N/A] % Reduction in Volume: [1:Full Thickness Without Exposed] [5:Full Thickness Without Exposed] [N/A:N/A] Classification: [1:Support Structures Small] [5:Support Structures  Medium] [N/A:N/A] Exudate Amount: [1:Serosanguineous] [5:Serosanguineous] [N/A:N/A] Exudate Type: [1:red, brown] [5:red, brown] [N/A:N/A] Exudate Color: [1:Flat and Intact] [5:Distinct, outline attached] [N/A:N/A] Wound Margin: [1:Large (67-100%)] [5:Large (67-100%)] [N/A:N/A] Granulation Amount: [1:Red] [5:Red, Pink] [N/A:N/A] Granulation Quality: [1:None Present (0%)] [5:None Present (0%)] [N/A:N/A] Necrotic Amount: [1:Fascia: No] [5:Fascia: No] [N/A:N/A] Exposed Structures: [1:Fat Layer (Subcutaneous Tissue): No Tendon: No Muscle: No Joint: No Bone: No Large (67-100%)] [5:Fat Layer (Subcutaneous Tissue): No Tendon: No Muscle: No Joint: No Bone: No Large (67-100%)] [N/A:N/A] Epithelialization: [1:Compression Therapy] [5:N/A] [N/A:N/A] Treatment Notes Electronic Signature(s) Signed: 07/14/2020 5:58:27 PM By: [name] RN Signed: 07/14/2020 5:59:48 PM By: [name] MD Entered By: [name] on 07/14/2020 09:38:43 -------------------------------------------------------------------------------- Multi-Disciplinary Care Plan Details Patient Name: Date of Service: [name] 07/14/2020 8:45 A M Medical Record Number: [id number] Patient Account Number: [id number] Date of Birth/Sex: Treating RN: 11/26/1970 (50 y.o. [name], [name] Primary Care Jassmine Vandruff: [name] Other Clinician: Referring Kerri Kovacik: Treating Breawna Montenegro/Extender: [name] in Treatment: 4 Active Inactive Wound/Skin Impairment Nursing Diagnoses: Impaired tissue integrity Knowledge deficit related to smoking impact on wound healing Goals: Patient will have a decrease in wound volume by X% from date: (specify in notes) Date Initiated: 06/16/2020 Target Resolution Date: 08/09/2020 Goal Status: Active Patient/caregiver will verbalize understanding of skin care regimen Date Initiated: 06/16/2020 Target Resolution Date: 08/11/2020 Goal Status: Active Ulcer/skin  breakdown will have a volume reduction of 30% by week 4 Date Initiated: 06/16/2020 Target Resolution Date: 08/13/2020 Goal Status: Active Interventions: Assess patient/caregiver ability to obtain necessary supplies Assess patient/caregiver ability to perform ulcer/skin care regimen upon admission and as needed Assess ulceration(s) every visit Notes: Electronic Signature(s) Signed: 07/14/2020 5:58:27 PM By: [name] RN Entered By: [name] on 07/14/2020 08:50:01 -------------------------------------------------------------------------------- Pain Assessment Details Patient Name: Date of Service: [name] 07/14/2020 8:45 A M Medical Record Number: [id number] Patient Account Number: [id number] Date of Birth/Sex: Treating RN: 11/11/1970 (50 y.o. [name], [name] Primary Care Dionna Wiedemann: [name] Other Clinician: Referring Rosamary Boudreau: Treating Zehra Rucci/Extender: [name] in Treatment: 4 Active Problems Location of Pain Severity and Description of Pain Patient Has Paino Yes Site Locations Rate the pain. Current Pain Level: 6 Pain Management and Medication Current Pain Management: Electronic Signature(s) Signed: 07/14/2020 5:53:33 PM By: Sandre Kitty Signed: 07/14/2020 5:58:27 PM By: [name] RN Entered By: Sandre Kitty on 07/14/2020 08:46:55 -------------------------------------------------------------------------------- Patient/Caregiver Education Details Patient Name: Date of Service: [name] 3/1/2022andnbsp8:45 A M Medical Record Number: [id number] Patient Account Number: [id number] Date of Birth/Gender: Treating RN: May 02, 1971 (50 y.o. Benjaman Lobe Primary Care Physician: [name] Other Clinician: Referring Physician: Treating Physician/Extender: [name] in Treatment: 4 Education Assessment Education Provided To: Patient Education Topics  Provided Basic  Hygiene: Methods: Explain/Verbal Responses: Reinforcements needed Electronic Signature(s) Signed: 07/14/2020 5:58:27 PM By: [name] RN Entered By: [name] on 07/14/2020 08:50:29 -------------------------------------------------------------------------------- Wound Assessment Details Patient Name: Date of Service: [name] 07/14/2020 8:45 A M Medical Record Number: [id number] Patient Account Number: [id number] Date of Birth/Sex: Treating RN: 09-Jun-1970 (50 y.o. [name], [name] Primary Care Amyra Vantuyl: [name] Other Clinician: Referring Steve Youngberg: Treating Raenette Sakata/Extender: [name] in Treatment: 4 Wound Status Wound Number: 1 Primary Venous Leg Ulcer Etiology: Wound Location: Left, Anterior Lower Leg Wound Open Wounding Event: Gradually Appeared Status: Date Acquired: 05/18/2020 Comorbid Lymphedema, Sleep Apnea, Hypertension, Peripheral Venous Weeks Of Treatment: 4 History: Disease, Osteoarthritis, Confinement Anxiety Clustered Wound: No Photos Wound Measurements Length: (cm) 0.1 Width: (cm) 0.1 Depth: (cm) 0.1 Area: (cm) 0.008 Volume: (cm) 0.001 % Reduction in Area: 100% % Reduction in Volume: 100% Epithelialization: Large (67-100%) Tunneling: No Undermining: No Wound Description Classification: Full Thickness Without Exposed Support Structures Wound Margin: Flat and Intact Exudate Amount: Small Exudate Type: Serosanguineous Exudate Color: red, brown Foul Odor After Cleansing: No Slough/Fibrino No Wound Bed Granulation Amount: Large (67-100%) Exposed Structure Granulation Quality: Red Fascia Exposed: No Necrotic Amount: None Present (0%) Fat Layer (Subcutaneous Tissue) Exposed: No Tendon Exposed: No Muscle Exposed: No Joint Exposed: No Bone Exposed: No Treatment Notes Wound #1 (Lower Leg) Wound Laterality: Left, Anterior Cleanser Peri-Wound Care Triamcinolone 15  (g) Discharge Instruction: Use triamcinolone 15 (g) as directed Sween Lotion (Moisturizing lotion) Discharge Instruction: Apply moisturizing lotion as directed Topical Primary Dressing KerraCel Ag Gelling Fiber Dressing, 2x2 in (silver alginate) Discharge Instruction: Apply silver alginate to wound bed as instructed Secondary Dressing ABD Pad, 5x9 Discharge Instruction: Apply over primary dressing as directed. Secured With Compression Wrap FourPress (4 layer compression wrap) Discharge Instruction: Apply four layer compression as directed. Compression Stockings Add-Ons Electronic Signature(s) Signed: 07/14/2020 5:53:33 PM By: Sandre Kitty Signed: 07/14/2020 5:58:27 PM By: [name] RN Entered By: Sandre Kitty on 07/14/2020 17:13:36 -------------------------------------------------------------------------------- Wound Assessment Details Patient Name: Date of Service: [name] 07/14/2020 8:45 A M Medical Record Number: [id number] Patient Account Number: [id number] Date of Birth/Sex: Treating RN: 1970/08/17 (50 y.o. [name], [name] Primary Care Lorenz Donley: [name] Other Clinician: Referring Nisreen Guise: Treating Vaudine Dutan/Extender: [name] in Treatment: 4 Wound Status Wound Number: 5 Primary Venous Leg Ulcer Etiology: Wound Location: Right, Anterior Lower Leg Wound Healed - Epithelialized Wounding Event: Gradually Appeared Status: Date Acquired: 05/18/2020 Comorbid Lymphedema, Sleep Apnea, Hypertension, Peripheral Venous Weeks Of Treatment: 4 History: Disease, Osteoarthritis, Confinement Anxiety Clustered Wound: No Wound Measurements Length: (cm) Width: (cm) Depth: (cm) Area: (cm) Volume: (cm) 0 % Reduction in Area: 100% 0 % Reduction in Volume: 100% 0 Epithelialization: Large (67-100%) 0 Tunneling: No 0 Undermining: No Wound Description Classification: Full Thickness Without Exposed Support  Structures Wound Margin: Distinct, outline attached Exudate Amount: Medium Exudate Type: Serosanguineous Exudate Color: red, brown Foul Odor After Cleansing: No Slough/Fibrino No Wound Bed Granulation Amount: Large (67-100%) Exposed Structure Granulation Quality: Red, Pink Fascia Exposed: No Necrotic Amount: None Present (0%) Fat Layer (Subcutaneous Tissue) Exposed: No Tendon Exposed: No Muscle Exposed: No Joint Exposed: No Bone Exposed: No Treatment Notes Wound #5 (Lower Leg) Wound Laterality: Right, Anterior Cleanser Peri-Wound Care Topical Primary Dressing Secondary Dressing Secured With Compression Wrap Compression Stockings Add-Ons Electronic Signature(s) Signed: 07/14/2020 5:58:27 PM By: [name] RN Entered By: [name] on 07/14/2020 09:34:38 -------------------------------------------------------------------------------- Vitals Details Patient Name: Date of Service: HA [name], Shaunessy  K. 07/14/2020 8:45 A M Medical Record Number: [id number] Patient Account Number: [id number] Date of Birth/Sex: Treating RN: 1970/12/22 (50 y.o. [name], [name] Primary Care Giah Fickett: [name] Other Clinician: Referring [place] Lapaglia: Treating Alyna Stensland/Extender: [name] in Treatment: 4 Vital Signs Time Taken: 08:46 Temperature (F): 98.1 Height (in): 62 Pulse (bpm): 81 Weight (lbs): 304 Respiratory Rate (breaths/min): 18 Body Mass Index (BMI): 55.6 Blood Pressure (mmHg): 134/65 Capillary Blood Glucose (mg/dl): 89 Reference Range: 80 - 120 mg / dl Electronic Signature(s) Signed: 07/14/2020 5:53:33 PM By: Sandre Kitty Entered By: Sandre Kitty on 07/14/2020 08:46:42

## 2020-07-14 NOTE — Progress Notes (Addendum)
Erica, Macias ([id number]) Visit Report for 07/14/2020 Debridement Details Patient Name: Date of Service: Erica TOTIANA, Macias 07/14/2020 8:45 A M Medical Record Number: [id number] Patient Account Number: [id number] Date of Birth/Sex: Treating RN: [birth year] (50 y.o. [name], [name] Primary Care Provider: [name] Other Clinician: Referring Provider: Treating Provider/Extender: [name] in Treatment: 4 Debridement Performed for Assessment: Wound #1 Left,Anterior Lower Leg Performed By: Physician Ricard Dillon., MD Debridement Type: Chemical/Enzymatic/Mechanical Agent Used: 4x4 gauze and wound cleanser Severity of Tissue Pre Debridement: Fat layer exposed Level of Consciousness (Pre-procedure): Awake and Alert Pre-procedure Verification/Time Out No Taken: Bleeding: None Response to Treatment: Procedure was tolerated well Level of Consciousness (Post- Awake and Alert procedure): Post Debridement Measurements of Total Wound Length: (cm) 0.1 Width: (cm) 0.1 Depth: (cm) 0.1 Volume: (cm) 0.001 Character of Wound/Ulcer Post Debridement: Improved Severity of Tissue Post Debridement: Fat layer exposed Post Procedure Diagnosis Same as Pre-procedure Electronic Signature(s) Signed: 07/20/2020 5:34:21 PM By: [name] RN Signed: 07/31/2020 5:26:06 PM By: [name] MD Entered By: [name] on 07/17/2020 11:48:30 -------------------------------------------------------------------------------- HPI Details Patient Name: Date of Service: Erica Gong. 07/14/2020 8:45 A M Medical Record Number: [id number] Patient Account Number: [id number] Date of Birth/Sex: Treating RN: [birth year] (50 y.o. [name], [name] Primary Care Provider: [name] Other Clinician: Referring Provider: Treating Provider/Extender: [name] in Treatment: 4 History of Present Illness HPI Description:  ADMISSION 06/16/2020 This is a 50 year old complicated woman. She states she has had chronic swelling and draining from her legs for many many years. She has been prescribed compression stockings in the past but does not wear them because they are too tight. She has been dealing with open areas on the left and the right leg for several weeks now. There is drainage coming out of the area on the left leg. She saw her primary earlier this month and was put on Augmentin. A culture was apparently done remotely showing MSSA. More recently she has been of applying Neosporin and Bactroban. She has 4 open areas on the left and 3 on the right. Patient also has a complicated medical and dermatologic history. She has a history of cutaneous polyarteritis nodosa apparently diagnosed by a biopsies of her skin in the lower legs. She was on immunosuppressive's with Humira for a while but not currently. She also has psoriasis. Was followed by dermatology at [facility] healthcare Dr. Romona Curls. Her notes were very helpful to review. Also has morbid obesity continuing to continuing to contribute to both venous and lymphatic issues. In the past she is just simply been unable to tolerate compression stockings. She is onSkyriza For her psoriasis. She was hospitalized in March of this year for cellulitis of both legs with sepsis. Past medical history includes psoriasis, hyperlipidemia, chronic lower extremity edema as described, cutaneous polyarteritis nodosa, sleep apnea fibromyalgia, osteoarthritis of both knees Patient's ABIs in our clinic were 0.96 on the right and 1.05 on the left she had venous reflux studies in May 2018. At that point on the right she had normal reflux times noted in the greater saphenous vein with no evidence of DVT No indirect evidence of obstruction proximal to the inguinal ligament. No evidence . of chronic venous insufficiency bilaterally and no evidence of a DVT 2/8; is a patient I admitted to  the clinic last week. I thought she had venous insufficiency ulcers. Perhaps mostly lymphedema. She did tolerate the 3 layer compression well smaller. Most problematic  areas on the left anterior mid tibia. There there is wounds right across from medial to lateral. Also a band of chronic venous stasis.. We used Hydrofera Blue on the wounds on the right leg wounds are not as dramatic. Small open areas anteriorly and medially. As noted she tolerated 3 layer compression well 2/15; patient's wounds are not any better on the left leg one of the areas on the right seems still closed her edema control with 3 layers is simply not good enough. We have been using Hydrofera Blue 2/22; patient's wounds on the right leg are healed. Still an area on the left anterior mid tibia area that has small open areas. She has what looks to be a cellulitis with complicating intertrigo surrounding the popliteal fossa on the left as well. They tell me that her primary doctor put her on Diflucan I think for intertrigo in the abdomen. She does not yet have her juxta lite stockings 3/1; the patient's wounds on the bilateral lower legs are healed and she actually has bilateral juxta lite stockings that got delivered to her neighbors where she did not bring them today. The other issue today is that she felt that her left ankle wrap was too tight so she cut it off her foot course she has localized swelling in her foot with some degree of erythema and tenderness but really no open wounds nevertheless we will have to wrap this leg again to make sure that this does not break down further. She has also done compression for the obligatory 4 weeks. Hopefully she will be eligible for external compression pumps. Were going to try to order these today Electronic Signature(s) Signed: 07/14/2020 5:59:48 PM By: [name] MD Entered By: [name] on 07/14/2020  09:40:30 -------------------------------------------------------------------------------- Physical Exam Details Patient Name: Date of Service: Erica Devoid K. 07/14/2020 8:45 A M Medical Record Number: [id number] Patient Account Number: [id number] Date of Birth/Sex: Treating RN: [birth year] (50 y.o. [name], [name] Primary Care Provider: [name] Other Clinician: Referring Provider: Treating Provider/Extender: [name] in Treatment: 4 Constitutional Sitting or standing Blood Pressure is within target range for patient.. Pulse regular and within target range for patient.Marland Kitchen Respirations regular, non-labored and within target range.. Temperature is normal and within the target range for the patient.Marland Kitchen Appears in no distress. Notes Wound exam; right anterior leg is closed left anterior and posterior legs are also closed. She has severe stasis dermatitis and very significant lymphedema especially in her thighs. New today was the localized edema on the left dorsal forefoot. This is angry and inflamed but I really doubt cellulitis. We are going to rewrap this area and she should be fine she has home health changing the dressings. We will use her juxta lite at 30/40 mmHg compression on the right leg if indeed that is possible. She did not unfortunately bring her stockings in today Electronic Signature(s) Signed: 07/14/2020 5:59:48 PM By: [name] MD Entered By: [name] on 07/14/2020 09:41:41 -------------------------------------------------------------------------------- Physician Orders Details Patient Name: Date of Service: Erica Devoid K. 07/14/2020 8:45 A M Medical Record Number: [id number] Patient Account Number: [id number] Date of Birth/Sex: Treating RN: 1970/08/23 (50 y.o. [name], [name] Primary Care Provider: [name] Other Clinician: Referring Provider: Treating Provider/Extender: [name] in Treatment: 4 Verbal / Phone Orders: No Diagnosis Coding Follow-up Appointments Return Appointment in 1 week. Bathing/ Shower/ Hygiene May shower with protection but do not get wound dressing(s) wet. - May use  cast protectors...you can find those at [facility] or [facility] Edema Control - Lymphedema / SCD / Other Lymphedema Pumps. Use Lymphedema pumps on leg(s) 2-3 times a day for 45-60 minutes. If wearing any wraps or hose, do not remove them. Continue exercising as instructed. - ordering today!! Elevate legs to the level of the heart or above for 30 minutes daily and/or when sitting, a frequency of: Avoid standing for long periods of time. Exercise regularly Moisturize legs daily. Other Edema Control Orders/Instructions: - wear your juxtalite on the right leg 30-40 mm/Hg. We are wrapping your left leg in 4 layer compression. Non Wound Condition pply the following to affected area as directed: - Apply Calcium alginate with silver to left dorsal foot. A Wound Treatment Wound #1 - Lower Leg Wound Laterality: Left, Anterior Peri-Wound Care: Triamcinolone 15 (g) 1 x Per Week/15 Days Discharge Instructions: Use triamcinolone 15 (g) as directed Peri-Wound Care: Sween Lotion (Moisturizing lotion) 1 x Per Week/15 Days Discharge Instructions: Apply moisturizing lotion as directed Prim Dressing: KerraCel Ag Gelling Fiber Dressing, 2x2 in (silver alginate) (Generic) 1 x Per Week/15 Days ary Discharge Instructions: Apply silver alginate to wound bed as instructed Secondary Dressing: ABD Pad, 5x9 1 x Per Week/15 Days Discharge Instructions: Apply over primary dressing as directed. Compression Wrap: FourPress (4 layer compression wrap) 1 x Per Week/15 Days Discharge Instructions: Apply four layer compression as directed. Compression Stockings: Circaid Juxta Lite Compression Wrap (DME) Left Leg Compression Amount: 30-40 mmHG Discharge Instructions: Apply Circaid Juxta Lite Compression  Wrap daily as instructed. Apply first thing in the morning, remove at night before bed. Electronic Signature(s) Signed: 07/16/2020 6:02:38 PM By: [name] RN Signed: 07/31/2020 5:26:06 PM By: [name] MD Previous Signature: 07/14/2020 5:58:27 PM Version By: [name] RN Previous Signature: 07/14/2020 5:59:48 PM Version By: [name] MD Entered By: [name] on 07/15/2020 10:45:04 -------------------------------------------------------------------------------- Problem List Details Patient Name: Date of Service: Erica Devoid K. 07/14/2020 8:45 A M Medical Record Number: [id number] Patient Account Number: [id number] Date of Birth/Sex: Treating RN: 10-29-1970 (50 y.o. [name], [name] Primary Care Provider: [name] Other Clinician: Referring Provider: Treating Provider/Extender: [name] in Treatment: 4 Active Problems ICD-10 Encounter Code Description Active Date MDM Diagnosis I87.333 Chronic venous hypertension (idiopathic) with ulcer and inflammation of 06/16/2020 No Yes bilateral lower extremity I89.0 Lymphedema, not elsewhere classified 06/16/2020 No Yes M30.0 Polyarteritis nodosa 06/16/2020 No Yes L97.821 Non-pressure chronic ulcer of other part of left lower leg limited to breakdown 06/16/2020 No Yes of skin L97.811 Non-pressure chronic ulcer of other part of right lower leg limited to breakdown 06/16/2020 No Yes of skin L03.116 Cellulitis of left lower limb 07/07/2020 No Yes Inactive Problems Resolved Problems Electronic Signature(s) Signed: 07/14/2020 5:59:48 PM By: [name] MD Entered By: [name] on 07/14/2020 09:38:34 -------------------------------------------------------------------------------- Progress Note Details Patient Name: Date of Service: Erica Devoid K. 07/14/2020 8:45 A M Medical Record Number: [id number] Patient Account Number: [id number] Date of Birth/Sex: Treating  RN: 11/22/1970 (50 y.o. [name], [name] Primary Care Provider: [name] Other Clinician: Referring Provider: Treating Provider/Extender: [name] in Treatment: 4 Subjective History of Present Illness (HPI) ADMISSION 06/16/2020 This is a 50 year old complicated woman. She states she has had chronic swelling and draining from her legs for many many years. She has been prescribed compression stockings in the past but does not wear them because they are too tight. She has been dealing with open areas on the left and  the right leg for several weeks now. There is drainage coming out of the area on the left leg. She saw her primary earlier this month and was put on Augmentin. A culture was apparently done remotely showing MSSA. More recently she has been of applying Neosporin and Bactroban. She has 4 open areas on the left and 3 on the right. Patient also has a complicated medical and dermatologic history. She has a history of cutaneous polyarteritis nodosa apparently diagnosed by a biopsies of her skin in the lower legs. She was on immunosuppressive's with Humira for a while but not currently. She also has psoriasis. Was followed by dermatology at [facility] healthcare Dr. Romona Curls. Her notes were very helpful to review. Also has morbid obesity continuing to continuing to contribute to both venous and lymphatic issues. In the past she is just simply been unable to tolerate compression stockings. She is onSkyriza For her psoriasis. She was hospitalized in March of this year for cellulitis of both legs with sepsis. Past medical history includes psoriasis, hyperlipidemia, chronic lower extremity edema as described, cutaneous polyarteritis nodosa, sleep apnea fibromyalgia, osteoarthritis of both knees Patient's ABIs in our clinic were 0.96 on the right and 1.05 on the left she had venous reflux studies in May 2018. At that point on the right she had  normal reflux times noted in the greater saphenous vein with no evidence of DVT No indirect evidence of obstruction proximal to the inguinal ligament. No evidence . of chronic venous insufficiency bilaterally and no evidence of a DVT 2/8; is a patient I admitted to the clinic last week. I thought she had venous insufficiency ulcers. Perhaps mostly lymphedema. She did tolerate the 3 layer compression well smaller. Most problematic areas on the left anterior mid tibia. There there is wounds right across from medial to lateral. Also a band of chronic venous stasis.. We used Hydrofera Blue on the wounds on the right leg wounds are not as dramatic. Small open areas anteriorly and medially. As noted she tolerated 3 layer compression well 2/15; patient's wounds are not any better on the left leg one of the areas on the right seems still closed her edema control with 3 layers is simply not good enough. We have been using Hydrofera Blue 2/22; patient's wounds on the right leg are healed. Still an area on the left anterior mid tibia area that has small open areas. She has what looks to be a cellulitis with complicating intertrigo surrounding the popliteal fossa on the left as well. They tell me that her primary doctor put her on Diflucan I think for intertrigo in the abdomen. She does not yet have her juxta lite stockings 3/1; the patient's wounds on the bilateral lower legs are healed and she actually has bilateral juxta lite stockings that got delivered to her neighbors where she did not bring them today. The other issue today is that she felt that her left ankle wrap was too tight so she cut it off her foot course she has localized swelling in her foot with some degree of erythema and tenderness but really no open wounds nevertheless we will have to wrap this leg again to make sure that this does not break down further. She has also done compression for the obligatory 4 weeks. Hopefully she will be  eligible for external compression pumps. Were going to try to order these today Objective Constitutional Sitting or standing Blood Pressure is within target range for patient.. Pulse regular and within  target range for patient.Marland Kitchen Respirations regular, non-labored and within target range.. Temperature is normal and within the target range for the patient.Marland Kitchen Appears in no distress. Vitals Time Taken: 8:46 AM, Height: 62 in, Weight: 304 lbs, BMI: 55.6, Temperature: 98.1 F, Pulse: 81 bpm, Respiratory Rate: 18 breaths/min, Blood Pressure: 134/65 mmHg, Capillary Blood Glucose: 89 mg/dl. General Notes: Wound exam; right anterior leg is closed left anterior and posterior legs are also closed. She has severe stasis dermatitis and very significant lymphedema especially in her thighs. ooNew today was the localized edema on the left dorsal forefoot. This is angry and inflamed but I really doubt cellulitis. We are going to rewrap this area and she should be fine she has home health changing the dressings. ooWe will use her juxta lite at 30/40 mmHg compression on the right leg if indeed that is possible. She did not unfortunately bring her stockings in today Integumentary (Hair, Skin) Wound #1 status is Open. Original cause of wound was Gradually Appeared. The date acquired was: 05/18/2020. The wound has been in treatment 4 weeks. The wound is located on the Left,Anterior Lower Leg. The wound measures 0.1cm length x 0.1cm width x 0.1cm depth; 0.008cm^2 area and 0.001cm^3 volume. There is no tunneling or undermining noted. There is a small amount of serosanguineous drainage noted. The wound margin is flat and intact. There is large (67- 100%) red granulation within the wound bed. There is no necrotic tissue within the wound bed. Wound #5 status is Healed - Epithelialized. Original cause of wound was Gradually Appeared. The date acquired was: 05/18/2020. The wound has been in treatment 4 weeks. The wound is located  on the Right,Anterior Lower Leg. The wound measures 0cm length x 0cm width x 0cm depth; 0cm^2 area and 0cm^3 volume. There is no tunneling or undermining noted. There is a medium amount of serosanguineous drainage noted. The wound margin is distinct with the outline attached to the wound base. There is large (67-100%) red, pink granulation within the wound bed. There is no necrotic tissue within the wound bed. Assessment Active Problems ICD-10 Chronic venous hypertension (idiopathic) with ulcer and inflammation of bilateral lower extremity Lymphedema, not elsewhere classified Polyarteritis nodosa Non-pressure chronic ulcer of other part of left lower leg limited to breakdown of skin Non-pressure chronic ulcer of other part of right lower leg limited to breakdown of skin Cellulitis of left lower limb Procedures Wound #1 Pre-procedure diagnosis of Wound #1 is a Venous Leg Ulcer located on the Left,Anterior Lower Leg . There was a Four Layer Compression Therapy Procedure by [name], RN. Post procedure Diagnosis Wound #1: Same as Pre-Procedure Plan Follow-up Appointments: Return Appointment in 1 week. Bathing/ Shower/ Hygiene: May shower with protection but do not get wound dressing(s) wet. - May use cast protectors...you can find those at [facility] or [facility] Edema Control - Lymphedema / SCD / Other: Lymphedema Pumps. Use Lymphedema pumps on leg(s) 2-3 times a day for 45-60 minutes. If wearing any wraps or hose, do not remove them. Continue exercising as instructed. - ordering today!! Elevate legs to the level of the heart or above for 30 minutes daily and/or when sitting, a frequency of: Avoid standing for long periods of time. Exercise regularly Moisturize legs daily. Other Edema Control Orders/Instructions: - wear your juxtalite on the right leg 30-40 mm/Hg. We are wrapping your left leg in 4 layer compression. Non Wound Condition: Apply the following to affected area as  directed: - Apply Calcium alginate with silver to left  dorsal foot. WOUND #1: - Lower Leg Wound Laterality: Left, Anterior Peri-Wound Care: Triamcinolone 15 (g) 1 x Per Week/15 Days Discharge Instructions: Use triamcinolone 15 (g) as directed Peri-Wound Care: Sween Lotion (Moisturizing lotion) 1 x Per Week/15 Days Discharge Instructions: Apply moisturizing lotion as directed Prim Dressing: KerraCel Ag Gelling Fiber Dressing, 2x2 in (silver alginate) (Generic) 1 x Per Week/15 Days ary Discharge Instructions: Apply silver alginate to wound bed as instructed Secondary Dressing: ABD Pad, 5x9 1 x Per Week/15 Days Discharge Instructions: Apply over primary dressing as directed. Com pression Wrap: FourPress (4 layer compression wrap) 1 x Per Week/15 Days Discharge Instructions: Apply four layer compression as directed. 1. I have allowed the patient to go to the juxta lite stocking on the right leg I am a bit worried about the compliance issue as she has not put it on yet technically she will call us if she has trouble 2. TCA silver alginate over the dorsal foot and the collection of fluid. Back in compression. 3. The patient should be healed optimistically by next week where also putting in orders for external compression pumps to control of lymphedema Electronic Signature(s) Signed: 07/20/2020 5:34:21 PM By: [name] RN Signed: 07/31/2020 5:26:06 PM By: [name] MD Previous Signature: 07/14/2020 5:59:48 PM Version By: [name] MD Entered By: [name] on 07/17/2020 11:48:54 -------------------------------------------------------------------------------- SuperBill Details Patient Name: Date of Service: Erica Dennison Mascot. 07/14/2020 Medical Record Number: [id number] Patient Account Number: [id number] Date of Birth/Sex: Treating RN: [birth year] (50 y.o. [name], [name] Primary Care Provider: [name] Other Clinician: Referring Provider: Treating  Provider/Extender: [name] in Treatment: 4 Diagnosis Coding ICD-10 Codes Code Description [phone] Chronic venous hypertension (idiopathic) with ulcer and inflammation of bilateral lower extremity I89.0 Lymphedema, not elsewhere classified M30.0 Polyarteritis nodosa L97.821 Non-pressure chronic ulcer of other part of left lower leg limited to breakdown of skin L97.811 Non-pressure chronic ulcer of other part of right lower leg limited to breakdown of skin L03.116 Cellulitis of left lower limb Facility Procedures CPT4 Code: [id number] Description: (Facility Use Only) [phone] - APPLY [place] LT LEG Modifier: Quantity: 1 Electronic Signature(s) Signed: 07/14/2020 5:58:27 PM By: [name] RN Signed: 07/14/2020 5:59:48 PM By: [name] MD Entered By: [name] on 07/14/2020 09:39:39

## 2020-07-17 ENCOUNTER — Other Ambulatory Visit: Payer: Medicare Other

## 2020-07-20 ENCOUNTER — Encounter (HOSPITAL_BASED_OUTPATIENT_CLINIC_OR_DEPARTMENT_OTHER): Payer: Medicare Other | Admitting: Physician Assistant

## 2020-07-20 ENCOUNTER — Other Ambulatory Visit: Payer: Self-pay

## 2020-07-20 DIAGNOSIS — L97911 Non-pressure chronic ulcer of unspecified part of right lower leg limited to breakdown of skin: Secondary | ICD-10-CM | POA: Diagnosis not present

## 2020-07-20 DIAGNOSIS — M300 Polyarteritis nodosa: Secondary | ICD-10-CM | POA: Diagnosis not present

## 2020-07-20 DIAGNOSIS — L03116 Cellulitis of left lower limb: Secondary | ICD-10-CM | POA: Diagnosis not present

## 2020-07-20 DIAGNOSIS — I890 Lymphedema, not elsewhere classified: Secondary | ICD-10-CM | POA: Diagnosis not present

## 2020-07-20 DIAGNOSIS — I87333 Chronic venous hypertension (idiopathic) with ulcer and inflammation of bilateral lower extremity: Secondary | ICD-10-CM | POA: Diagnosis not present

## 2020-07-20 DIAGNOSIS — L97821 Non-pressure chronic ulcer of other part of left lower leg limited to breakdown of skin: Secondary | ICD-10-CM | POA: Diagnosis not present

## 2020-07-20 DIAGNOSIS — L97811 Non-pressure chronic ulcer of other part of right lower leg limited to breakdown of skin: Secondary | ICD-10-CM | POA: Diagnosis not present

## 2020-07-20 NOTE — Progress Notes (Signed)
[name], [name] ([id number]) Visit Report for 07/20/2020 Fall Risk Assessment Details Patient Name: Date of Service: [name], [name] 07/20/2020 11:00 A M Medical Record Number: [id number] Patient Account Number: [id number] Date of Birth/Sex: Treating RN: August 17, 1970 (50 y.o. [name], Meta.Reding Primary Care Jabre Heo: [name] Other Clinician: Referring Shavelle Runkel: Treating Chianne Byrns/Extender: Marisa Sprinkles in Treatment: 4 Fall Risk Assessment Items Have you had 2 or more falls in the last 12 monthso 0 Yes Have you had any fall that resulted in injury in the last 12 monthso 0 Yes FALLS RISK SCREEN History of falling - immediate or within 3 months 25 Yes Secondary diagnosis (Do you have 2 or more medical diagnoseso) 0 No Ambulatory aid None/bed rest/wheelchair/nurse 0 Yes Crutches/cane/walker 15 Yes Furniture 0 No Intravenous therapy Access/Saline/Heparin Lock 0 No Gait/Transferring Normal/ bed rest/ wheelchair 0 No Weak (short steps with or without shuffle, stooped but able to lift head while walking, may seek 10 Yes support from furniture) Impaired (short steps with shuffle, may have difficulty arising from chair, head down, impaired 0 No balance) Mental Status Oriented to own ability 0 Yes Electronic Signature(s) Signed: 07/20/2020 5:26:37 PM By: Deon Pilling Entered By: Deon Pilling on 07/20/2020 11:55:11

## 2020-07-20 NOTE — Progress Notes (Signed)
[name], [name] ([id number]) Visit Report for 07/20/2020 SuperBill Details Patient Name: Date of Service: [name], [name] 07/20/2020 Medical Record Number: [id number] Patient Account Number: [id number] Date of Birth/Sex: Treating RN: 1970/07/29 (51 y.o. 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

## 2020-07-20 NOTE — Progress Notes (Signed)
[name], [name] ([id number]) Visit Report for 07/20/2020 Arrival Information Details Patient Name: Date of Service: [name], [name] 07/20/2020 11:00 A M Medical Record Number: [id number] Patient Account Number: [id number] Date of Birth/Sex: Treating RN: 1970-10-09 (50 y.o. [name], Meta.Reding Primary Care Doxie Augenstein: [name] Other Clinician: Referring Bobby Ragan: Treating Aaminah Forrester/Extender: Marisa Sprinkles in Treatment: 4 Visit Information History Since Last Visit Added or deleted any medications: No Patient Arrived: Wheel Chair Any new allergies or adverse reactions: No Arrival Time: 11:20 Had a fall or experienced change in Yes Accompanied By: self activities of daily living that may affect Transfer Assistance: None risk of falls: Patient Identification Verified: Yes Signs or symptoms of abuse/neglect since last visito No Secondary Verification Process Completed: Yes Hospitalized since last visit: No Patient Requires Transmission-Based Precautions: No Implantable device outside of the clinic excluding No Patient Has Alerts: No cellular tissue based products placed in the center since last visit: Has Dressing in Place as Prescribed: No Has Compression in Place as Prescribed: No Pain Present Now: No Notes Per patient fell at home this morning. Patient reports did not get hurt. fall assessment complete. Patient also reported removed compression wrap from left leg related to falling down leg. Notable several purple bruising areas from wrap. Electronic Signature(s) Signed: 07/20/2020 5:26:37 PM By: Deon Pilling Entered By: Deon Pilling on 07/20/2020 11:54:45 -------------------------------------------------------------------------------- Compression Therapy Details Patient Name: Date of Service: [name], [name] 07/20/2020 11:00 A M Medical Record Number: [id number] Patient Account Number: [id number] Date of Birth/Sex: Treating RN: 1970-10-28 (50 y.o. [name] Primary Care Glendy Barsanti: [name] Other Clinician: Referring Neiman Roots: Treating Darely Becknell/Extender: Marisa Sprinkles in Treatment: 4 Compression Therapy Performed for Wound Assessment: Wound #1 Left,Anterior Lower Leg Performed By: Clinician Deon Pilling, RN Compression Type: Four Layer Electronic Signature(s) Signed: 07/20/2020 5:26:37 PM By: Deon Pilling Entered By: Deon Pilling on 07/20/2020 11:39:41 -------------------------------------------------------------------------------- Encounter Discharge Information Details Patient Name: Date of Service: Erica Devoid K. 07/20/2020 11:00 A M Medical Record Number: [id number] Patient Account Number: [id number] Date of Birth/Sex: Treating RN: 07/03/1970 (50 y.o. [name] Primary Care Torsha Lemus: [name] Other Clinician: Referring Makenze Ellett: Treating Jonda Alanis/Extender: Marisa Sprinkles in Treatment: 4 Encounter Discharge Information Items Discharge Condition: Stable Ambulatory Status: Wheelchair Discharge Destination: Home Transportation: Private Auto Accompanied By: self Schedule Follow-up Appointment: Yes Clinical Summary of Care: Electronic Signature(s) Signed: 07/20/2020 5:26:37 PM By: Deon Pilling Entered By: Deon Pilling on 07/20/2020 11:40:51 -------------------------------------------------------------------------------- Patient/Caregiver Education Details Patient Name: Date of Service: [name] 3/7/2022andnbsp11:00 A M Medical Record Number: [id number] Patient Account Number: [id number] Date of Birth/Gender: Treating RN: 1971-02-26 (50 y.o. [name] Primary Care Physician: [name] Other Clinician: Referring Physician: Treating Physician/Extender: Marisa Sprinkles in Treatment: 4 Education Assessment Education Provided To: Patient Education Topics Provided Wound/Skin  Impairment: Handouts: Skin Care Do's and Dont's Methods: Explain/Verbal Responses: Reinforcements needed Electronic Signature(s) Signed: 07/20/2020 5:26:37 PM By: Deon Pilling Entered By: Deon Pilling on 07/20/2020 11:40:19 -------------------------------------------------------------------------------- Wound Assessment Details Patient Name: Date of Service: [name], [name] 07/20/2020 11:00 A M Medical Record Number: [id number] Patient Account Number: [id number] Date of Birth/Sex: Treating RN: [birth year] (50 y.o. [name] Primary Care Licet Dunphy: [name] Other Clinician: Referring Navon Kotowski: Treating Jada Kuhnert/Extender: Marisa Sprinkles in Treatment: 4 Wound Status Wound Number: 1 Primary Etiology: Venous Leg Ulcer Wound Location: Left, Anterior Lower Leg Wound Status: Open Wounding  Event: Gradually Appeared Date Acquired: 05/18/2020 Weeks Of Treatment: 4 Clustered Wound: No Wound Measurements Length: (cm) 0.1 Width: (cm) 0.1 Depth: (cm) 0.1 Area: (cm) 0.008 Volume: (cm) 0.001 % Reduction in Area: 100% % Reduction in Volume: 100% Wound Description Classification: Full Thickness Without Exposed Support Structur es Treatment Notes Wound #1 (Lower Leg) Wound Laterality: Left, Anterior Cleanser Peri-Wound Care Triamcinolone 15 (g) Discharge Instruction: Use triamcinolone 15 (g) as directed Sween Lotion (Moisturizing lotion) Discharge Instruction: Apply moisturizing lotion as directed Topical Primary Dressing KerraCel Ag Gelling Fiber Dressing, 2x2 in (silver alginate) Discharge Instruction: Apply silver alginate to wound bed as instructed Secondary Dressing ABD Pad, 5x9 Discharge Instruction: Apply over primary dressing as directed. Secured With Compression Wrap FourPress (4 layer compression wrap) Discharge Instruction: Apply four layer compression as directed. Compression Stockings Circaid Juxta Lite Compression  Wrap Quantity: 1 Left Leg Compression Amount: 30-40 mmHg Discharge Instruction: Apply Circaid Juxta Lite Compression Wrap daily as instructed. Apply first thing in the morning, remove at night before bed. Add-Ons Electronic Signature(s) Signed: 07/20/2020 5:26:37 PM By: Deon Pilling Entered By: Deon Pilling on 07/20/2020 11:39:28 -------------------------------------------------------------------------------- Vitals Details Patient Name: Date of Service: Erica Devoid K. 07/20/2020 11:00 A M Medical Record Number: [id number] Patient Account Number: [id number] Date of Birth/Sex: Treating RN: Nov 09, 1970 (50 y.o. [name], Meta.Reding Primary Care Le Faulcon: [name] Other Clinician: Referring Philopateer Strine: Treating Sriram Febles/Extender: Marisa Sprinkles in Treatment: 4 Vital Signs Time Taken: 11:38 Temperature (F): 98.3 Height (in): 62 Pulse (bpm): 91 Weight (lbs): 304 Respiratory Rate (breaths/min): 20 Body Mass Index (BMI): 55.6 Blood Pressure (mmHg): 145/66 Reference Range: 80 - 120 mg / dl Electronic Signature(s) Signed: 07/20/2020 5:26:37 PM By: Deon Pilling Entered By: Deon Pilling on 07/20/2020 11:54:00

## 2020-07-21 ENCOUNTER — Encounter (HOSPITAL_BASED_OUTPATIENT_CLINIC_OR_DEPARTMENT_OTHER): Payer: Medicare Other | Admitting: Physician Assistant

## 2020-07-21 ENCOUNTER — Other Ambulatory Visit: Payer: Self-pay

## 2020-07-21 DIAGNOSIS — I87333 Chronic venous hypertension (idiopathic) with ulcer and inflammation of bilateral lower extremity: Secondary | ICD-10-CM | POA: Diagnosis not present

## 2020-07-21 DIAGNOSIS — M300 Polyarteritis nodosa: Secondary | ICD-10-CM | POA: Diagnosis not present

## 2020-07-21 DIAGNOSIS — L03116 Cellulitis of left lower limb: Secondary | ICD-10-CM | POA: Diagnosis not present

## 2020-07-21 DIAGNOSIS — I87332 Chronic venous hypertension (idiopathic) with ulcer and inflammation of left lower extremity: Secondary | ICD-10-CM | POA: Diagnosis not present

## 2020-07-21 DIAGNOSIS — L97811 Non-pressure chronic ulcer of other part of right lower leg limited to breakdown of skin: Secondary | ICD-10-CM | POA: Diagnosis not present

## 2020-07-21 DIAGNOSIS — I890 Lymphedema, not elsewhere classified: Secondary | ICD-10-CM | POA: Diagnosis not present

## 2020-07-21 DIAGNOSIS — L97821 Non-pressure chronic ulcer of other part of left lower leg limited to breakdown of skin: Secondary | ICD-10-CM | POA: Diagnosis not present

## 2020-07-21 NOTE — Progress Notes (Addendum)
Erica, Macias ([id number]) Visit Report for 07/21/2020 Arrival Information Details Patient Name: Date of Service: [name], [name] 07/21/2020 1:00 PM Medical Record Number: [id number] Patient Account Number: [id number] Date of Birth/Sex: Treating RN: August 16, 1970 (50 y.o. [name], [name] Primary Care Marva Hendryx: [name] Other Clinician: Referring Taunya Goral: Treating Lael Wetherbee/Extender: Marisa Sprinkles in Treatment: 5 Visit Information History Since Last Visit Added or deleted any medications: No Patient Arrived: Kasandra Knudsen Any new allergies or adverse reactions: No Arrival Time: 14:12 Had a fall or experienced change in No Transfer Assistance: None activities of daily living that may affect Patient Identification Verified: Yes risk of falls: Secondary Verification Process Completed: Yes Signs or symptoms of abuse/neglect since last visito No Patient Requires Transmission-Based Precautions: No Hospitalized since last visit: No Patient Has Alerts: No Implantable device outside of the clinic excluding No cellular tissue based products placed in the center since last visit: Has Dressing in Place as Prescribed: Yes Has Compression in Place as Prescribed: Yes Pain Present Now: No Electronic Signature(s) Signed: 07/21/2020 2:13:06 PM By: Lorrin Jackson Entered By: Lorrin Jackson on 07/21/2020 14:13:06 -------------------------------------------------------------------------------- Compression Therapy Details Patient Name: Date of Service: Erica Devoid K. 07/21/2020 1:00 PM Medical Record Number: [id number] Patient Account Number: [id number] Date of Birth/Sex: Treating RN: August 23, 1970 (50 y.o. [name], [name] Primary Care Koreen Lizaola: [name] Other Clinician: Referring Asianae Minkler: Treating Mykale Gandolfo/Extender: Marisa Sprinkles in Treatment: 5 Compression Therapy Performed for Wound Assessment: Wound #1 Left,Anterior Lower  Leg Performed By: Clinician [name], RN Compression Type: Four Layer Post Procedure Diagnosis Same as Pre-procedure Electronic Signature(s) Signed: 07/22/2020 5:36:09 PM By: [name] RN Entered By: [name] on 07/21/2020 14:21:53 -------------------------------------------------------------------------------- Compression Therapy Details Patient Name: Date of Service: Erica Devoid K. 07/21/2020 1:00 PM Medical Record Number: [id number] Patient Account Number: [id number] Date of Birth/Sex: Treating RN: December 06, 1970 (50 y.o. [name], [name] Primary Care Mirza Kidney: [name] Other Clinician: Referring Ismahan Lippman: Treating Amelianna Meller/Extender: Marisa Sprinkles in Treatment: 5 Compression Therapy Performed for Wound Assessment: Wound #9 Left,Posterior Lower Leg Performed By: Clinician [name], RN Compression Type: Four Layer Post Procedure Diagnosis Same as Pre-procedure Electronic Signature(s) Signed: 07/22/2020 5:36:09 PM By: [name] RN Entered By: [name] on 07/21/2020 14:21:53 -------------------------------------------------------------------------------- Encounter Discharge Information Details Patient Name: Date of Service: Erica Orson Gear K. 07/21/2020 1:00 PM Medical Record Number: [id number] Patient Account Number: [id number] Date of Birth/Sex: Treating RN: 1970-08-27 (50 y.o. [name] Primary Care [name]: [name] Other Clinician: Referring Tacori Kvamme: Treating Kortnee Bas/Extender: Marisa Sprinkles in Treatment: 5 Encounter Discharge Information Items Discharge Condition: Stable Ambulatory Status: Wheelchair Discharge Destination: Home Transportation: Private Auto Accompanied By: self Schedule Follow-up Appointment: Yes Clinical Summary of Care: Electronic Signature(s) Signed: 07/21/2020 5:55:08 PM By: Deon Pilling Entered By: Deon Pilling on  07/21/2020 17:47:44 -------------------------------------------------------------------------------- Lower Extremity Assessment Details Patient Name: Date of Service: Erica NAYSHA, Macias 07/21/2020 1:00 PM Medical Record Number: [id number] Patient Account Number: [id number] Date of Birth/Sex: Treating RN: 03/20/1971 (50 y.o. [name], [name] Primary Care Frederika Hukill: [name] Other Clinician: Referring Brevyn Ring: Treating Kitiara Hintze/Extender: Marisa Sprinkles in Treatment: 5 Edema Assessment Assessed: Shirlyn Goltz: Yes] Patrice Paradise: No] Edema: [Left: Yes] [Right: Yes] Calf Left: Right: Point of Measurement: 28 cm From Medial Instep 44 cm 42 cm Ankle Left: Right: Point of Measurement: 11 cm From Medial Instep 27.6 cm 27 cm Vascular Assessment Pulses: Dorsalis Pedis Palpable: [Left:Yes] [Right:Yes] Electronic Signature(s)  Signed: 07/21/2020 2:14:39 PM By: Lorrin Jackson Signed: 07/22/2020 5:36:09 PM By: [name] RN Entered By: Lorrin Jackson on 07/21/2020 14:14:38 -------------------------------------------------------------------------------- Multi-Disciplinary Care Plan Details Patient Name: Date of Service: Erica Devoid K. 07/21/2020 1:00 PM Medical Record Number: [id number] Patient Account Number: [id number] Date of Birth/Sex: Treating RN: 1971/04/03 (50 y.o. [name], [name] Primary Care Westly Hinnant: [name] Other Clinician: Referring Aalyiah Camberos: Treating Kristianne Albin/Extender: Marisa Sprinkles in Treatment: 5 Active Inactive Wound/Skin Impairment Nursing Diagnoses: Impaired tissue integrity Knowledge deficit related to smoking impact on wound healing Goals: Patient will have a decrease in wound volume by X% from date: (specify in notes) Date Initiated: 06/16/2020 Target Resolution Date: 08/09/2020 Goal Status: Active Patient/caregiver will verbalize understanding of skin care regimen Date Initiated: 06/16/2020 Target  Resolution Date: 08/11/2020 Goal Status: Active Ulcer/skin breakdown will have a volume reduction of 30% by week 4 Date Initiated: 06/16/2020 Target Resolution Date: 08/13/2020 Goal Status: Active Interventions: Assess patient/caregiver ability to obtain necessary supplies Assess patient/caregiver ability to perform ulcer/skin care regimen upon admission and as needed Assess ulceration(s) every visit Notes: Electronic Signature(s) Signed: 07/22/2020 5:36:09 PM By: [name] RN Entered By: [name] on 07/21/2020 14:14:00 -------------------------------------------------------------------------------- Pain Assessment Details Patient Name: Date of Service: Erica Orson Gear K. 07/21/2020 1:00 PM Medical Record Number: [id number] Patient Account Number: [id number] Date of Birth/Sex: Treating RN: 03-10-1971 (50 y.o. [name], [name] Primary Care Arthor Gorter: [name] Other Clinician: Referring Jamond Neels: Treating Sherleen Pangborn/Extender: Marisa Sprinkles in Treatment: 5 Active Problems Location of Pain Severity and Description of Pain Patient Has Paino No Site Locations Pain Management and Medication Current Pain Management: Electronic Signature(s) Signed: 07/21/2020 2:13:44 PM By: Lorrin Jackson Signed: 07/22/2020 5:36:09 PM By: [name] RN Entered By: Lorrin Jackson on 07/21/2020 14:13:43 -------------------------------------------------------------------------------- Patient/Caregiver Education Details Patient Name: Date of Service: Erica Dennison Mascot 3/8/2022andnbsp1:00 PM Medical Record Number: [id number] Patient Account Number: [id number] Date of Birth/Gender: Treating RN: [birth year] (50 y.o. [name], [name] Primary Care Physician: [name] Other Clinician: Referring Physician: Treating Physician/Extender: Marisa Sprinkles in Treatment: 5 Education Assessment Education Provided  To: Patient Education Topics Provided Basic Hygiene: Methods: Explain/Verbal Responses: State content correctly Electronic Signature(s) Signed: 07/22/2020 5:36:09 PM By: [name] RN Entered By: [name] on 07/21/2020 14:14:49 -------------------------------------------------------------------------------- Wound Assessment Details Patient Name: Date of Service: Erica Orson Gear K. 07/21/2020 1:00 PM Medical Record Number: [id number] Patient Account Number: [id number] Date of Birth/Sex: Treating RN: 1970-09-12 (50 y.o. [name], [name] Primary Care Claudy Abdallah: [name] Other Clinician: Referring Olson Lucarelli: Treating Melony Tenpas/Extender: Marisa Sprinkles in Treatment: 5 Wound Status Wound Number: 1 Primary Venous Leg Ulcer Etiology: Wound Location: Left, Anterior Lower Leg Wound Open Wounding Event: Gradually Appeared Status: Date Acquired: 05/18/2020 Comorbid Lymphedema, Sleep Apnea, Hypertension, Peripheral Venous Weeks Of Treatment: 5 History: Disease, Osteoarthritis, Confinement Anxiety Clustered Wound: No Photos Wound Measurements Length: (cm) 0.5 Width: (cm) 0.5 Depth: (cm) 0.1 Area: (cm) 0.196 Volume: (cm) 0.02 % Reduction in Area: 99.4% % Reduction in Volume: 99.3% Epithelialization: Medium (34-66%) Tunneling: No Undermining: No Wound Description Classification: Full Thickness Without Exposed Support Structures Wound Margin: Distinct, outline attached Exudate Amount: Medium Exudate Type: Serosanguineous Exudate Color: red, brown Foul Odor After Cleansing: No Slough/Fibrino No Wound Bed Granulation Amount: Large (67-100%) Exposed Structure Granulation Quality: Red Fascia Exposed: No Fat Layer (Subcutaneous Tissue) Exposed: No Tendon Exposed: No Muscle Exposed: No Joint Exposed: No Bone Exposed: No Treatment Notes Wound #1 (Lower  Leg) Wound Laterality: Left, Anterior Cleanser Peri-Wound Care Triamcinolone 15  (g) Discharge Instruction: Use triamcinolone 15 (g) as directed Sween Lotion (Moisturizing lotion) Discharge Instruction: Apply moisturizing lotion as directed Topical Primary Dressing KerraCel Ag Gelling Fiber Dressing, 2x2 in (silver alginate) Discharge Instruction: Apply silver alginate to wound bed as instructed Secondary Dressing ABD Pad, 5x9 Discharge Instruction: Apply over primary dressing as directed. Secured With Compression Wrap FourPress (4 layer compression wrap) Discharge Instruction: Apply four layer compression as directed. Compression Stockings Circaid Juxta Lite Compression Wrap Quantity: 1 Left Leg Compression Amount: 30-40 mmHg Discharge Instruction: Apply Circaid Juxta Lite Compression Wrap daily as instructed. Apply first thing in the morning, remove at night before bed. Add-Ons Electronic Signature(s) Signed: 07/22/2020 5:36:09 PM By: [name] RN Signed: 07/23/2020 9:46:14 AM By: Sandre Kitty Previous Signature: 07/21/2020 2:15:30 PM Version By: Lorrin Jackson Entered By: Sandre Kitty on 07/22/2020 17:00:53 -------------------------------------------------------------------------------- Wound Assessment Details Patient Name: Date of Service: Erica Orson Gear K. 07/21/2020 1:00 PM Medical Record Number: [id number] Patient Account Number: [id number] Date of Birth/Sex: Treating RN: 28-Aug-1970 (50 y.o. [name], [name] Primary Care Anda Sobotta: [name] Other Clinician: Referring Marielis Samara: Treating Niki Payment/Extender: Marisa Sprinkles in Treatment: 5 Wound Status Wound Number: 9 Primary Venous Leg Ulcer Etiology: Wound Location: Left, Posterior Lower Leg Wound Open Wounding Event: Gradually Appeared Status: Date Acquired: 07/21/2020 Comorbid Lymphedema, Sleep Apnea, Hypertension, Peripheral Venous Weeks Of Treatment: 0 History: Disease, Osteoarthritis, Confinement Anxiety Clustered Wound: No Photos Wound  Measurements Length: (cm) 2.2 Width: (cm) 2 Depth: (cm) 0.1 Area: (cm) 3.456 Volume: (cm) 0.346 % Reduction in Area: 0% % Reduction in Volume: 0% Epithelialization: None Tunneling: No Undermining: No Wound Description Classification: Full Thickness Without Exposed Support Struct Wound Margin: Distinct, outline attached Exudate Amount: Medium Exudate Type: Serosanguineous Exudate Color: red, brown ures Foul Odor After Cleansing: No Slough/Fibrino No Wound Bed Granulation Amount: Large (67-100%) Exposed Structure Granulation Quality: Red, Pink Fascia Exposed: No Necrotic Amount: None Present (0%) Fat Layer (Subcutaneous Tissue) Exposed: No Tendon Exposed: No Muscle Exposed: No Joint Exposed: No Bone Exposed: No Treatment Notes Wound #9 (Lower Leg) Wound Laterality: Left, Posterior Cleanser Peri-Wound Care Triamcinolone 15 (g) Discharge Instruction: Use triamcinolone 15 (g) as directed Sween Lotion (Moisturizing lotion) Discharge Instruction: Apply moisturizing lotion as directed Topical Primary Dressing KerraCel Ag Gelling Fiber Dressing, 2x2 in (silver alginate) Discharge Instruction: Apply silver alginate to wound bed as instructed Secondary Dressing ABD Pad, 5x9 Discharge Instruction: Apply over primary dressing as directed. Secured With Compression Wrap FourPress (4 layer compression wrap) Discharge Instruction: Apply four layer compression as directed. Compression Stockings Add-Ons Electronic Signature(s) Signed: 07/22/2020 5:36:09 PM By: [name] RN Signed: 07/23/2020 9:46:14 AM By: Sandre Kitty Previous Signature: 07/21/2020 2:17:58 PM Version By: Lorrin Jackson Entered By: Sandre Kitty on 07/22/2020 17:01:46 -------------------------------------------------------------------------------- Vitals Details Patient Name: Date of Service: Erica [name], Otila Kluver K. 07/21/2020 1:00 PM Medical Record Number: [id number] Patient Account Number: [id number] Date  of Birth/Sex: Treating RN: [birth year] (50 y.o. [name], [name] Primary Care Valor Quaintance: [name] Other Clinician: Referring Jaeson Molstad: Treating Pleasant Britz/Extender: Marisa Sprinkles in Treatment: 5 Vital Signs Time Taken: 13:40 Temperature (F): 98.2 Height (in): 62 Pulse (bpm): 75 Weight (lbs): 304 Respiratory Rate (breaths/min): 18 Body Mass Index (BMI): 55.6 Blood Pressure (mmHg): 139/69 Reference Range: 80 - 120 mg / dl Electronic Signature(s) Signed: 07/21/2020 2:13:31 PM By: Lorrin Jackson Entered By: Lorrin Jackson on 07/21/2020 14:13:31

## 2020-07-21 NOTE — Progress Notes (Addendum)
Erica, Macias ([id number]) Visit Report for 07/21/2020 Chief Complaint Document Details Patient Name: Date of Service: [name], [name] 07/21/2020 1:00 PM Medical Record Number: [id number] Patient Account Number: [id number] Date of Birth/Sex: Treating RN: 1971/02/11 (50 y.o. [name] Primary Care Provider: [name] Other Clinician: Referring Provider: Treating Provider/Extender: Marisa Sprinkles in Treatment: 5 Information Obtained from: Patient Chief Complaint 06/16/2020; patient is here for review of wounds on her bilateral lower legs left greater than right Electronic Signature(s) Signed: 07/21/2020 2:16:47 PM By: Worthy Keeler PA-C Entered By: Worthy Keeler on 07/21/2020 14:16:47 -------------------------------------------------------------------------------- HPI Details Patient Name: Date of Service: [name], Erica Kluver K. 07/21/2020 1:00 PM Medical Record Number: [id number] Patient Account Number: [id number] Date of Birth/Sex: Treating RN: 1970/08/02 (50 y.o. [name] Primary Care Provider: [name] Other Clinician: Referring Provider: Treating Provider/Extender: Marisa Sprinkles in Treatment: 5 History of Present Illness HPI Description: ADMISSION 06/16/2020 This is a 50 year old complicated woman. She states she has had chronic swelling and draining from her legs for many many years. She has been prescribed compression stockings in the past but does not wear them because they are too tight. She has been dealing with open areas on the left and the right leg for several weeks now. There is drainage coming out of the area on the left leg. She saw her primary earlier this month and was put on Augmentin. A culture was apparently done remotely showing MSSA. More recently she has been of applying Neosporin and Bactroban. She has 4 open areas on the left and 3 on the right. Patient also has a complicated  medical and dermatologic history. She has a history of cutaneous polyarteritis nodosa apparently diagnosed by a biopsies of her skin in the lower legs. She was on immunosuppressive's with Humira for a while but not currently. She also has psoriasis. Was followed by dermatology at [facility] healthcare Dr. Romona Curls. Her notes were very helpful to review. Also has morbid obesity continuing to continuing to contribute to both venous and lymphatic issues. In the past she is just simply been unable to tolerate compression stockings. She is onSkyriza For her psoriasis. She was hospitalized in March of this year for cellulitis of both legs with sepsis. Past medical history includes psoriasis, hyperlipidemia, chronic lower extremity edema as described, cutaneous polyarteritis nodosa, sleep apnea fibromyalgia, osteoarthritis of both knees Patient's ABIs in our clinic were 0.96 on the right and 1.05 on the left she had venous reflux studies in May 2018. At that point on the right she had normal reflux times noted in the greater saphenous vein with no evidence of DVT No indirect evidence of obstruction proximal to the inguinal ligament. No evidence . of chronic venous insufficiency bilaterally and no evidence of a DVT 2/8; is a patient I admitted to the clinic last week. I thought she had venous insufficiency ulcers. Perhaps mostly lymphedema. She did tolerate the 3 layer compression well smaller. Most problematic areas on the left anterior mid tibia. There there is wounds right across from medial to lateral. Also a band of chronic venous stasis.. We used Hydrofera Blue on the wounds on the right leg wounds are not as dramatic. Small open areas anteriorly and medially. As noted she tolerated 3 layer compression well 2/15; patient's wounds are not any better on the left leg one of the areas on the right seems still closed her edema control with 3 layers is simply  not good enough. We have been using Hydrofera  Blue 2/22; patient's wounds on the right leg are healed. Still an area on the left anterior mid tibia area that has small open areas. She has what looks to be a cellulitis with complicating intertrigo surrounding the popliteal fossa on the left as well. They tell me that her primary doctor put her on Diflucan I think for intertrigo in the abdomen. She does not yet have her juxta lite stockings 3/1; the patient's wounds on the bilateral lower legs are healed and she actually has bilateral juxta lite stockings that got delivered to her neighbors where she did not bring them today. The other issue today is that she felt that her left ankle wrap was too tight so she cut it off her foot course she has localized swelling in her foot with some degree of erythema and tenderness but really no open wounds nevertheless we will have to wrap this leg again to make sure that this does not break down further. She has also done compression for the obligatory 4 weeks. Hopefully she will be eligible for external compression pumps. Were going to try to order these today 07/21/2020 upon evaluation today patient appears to be doing a little worse in regard to her left leg as compared to previous. Fortunately there is no evidence of active infection at this time which is great news. She did have her wrap slipped down on her yesterday and she came in to be seen she did have a couple areas where it looks like it was somewhat bruised and rings around her leg where the slipped. Other than that however she seems to be doing quite well. There is no signs of active infection at this time which is great news. No fevers, chills, nausea, vomiting, or diarrhea. Electronic Signature(s) Signed: 07/21/2020 2:22:57 PM By: Worthy Keeler PA-C Entered By: Worthy Keeler on 07/21/2020 14:22:57 -------------------------------------------------------------------------------- Physical Exam Details Patient Name: Date of Service: [name], Erica  K. 07/21/2020 1:00 PM Medical Record Number: [id number] Patient Account Number: [id number] Date of Birth/Sex: Treating RN: 1970/09/22 (50 y.o. [name] Primary Care Provider: [name] Other Clinician: Referring Provider: Treating Provider/Extender: Marisa Sprinkles in Treatment: 5 Constitutional Well-nourished and well-hydrated in no acute distress. Respiratory normal breathing without difficulty. Psychiatric this patient is able to make decisions and demonstrates good insight into disease process. Alert and Oriented x 3. pleasant and cooperative. Notes Upon inspection patient's wound bed actually showed signs of good granulation epithelization at this point. There does not appear to be any evidence of active infection which is great news and overall I am very pleased with where things stand. No fevers, chills, nausea, vomiting, or diarrhea. Electronic Signature(s) Signed: 07/21/2020 2:23:14 PM By: Worthy Keeler PA-C Entered By: Worthy Keeler on 07/21/2020 14:23:14 -------------------------------------------------------------------------------- Physician Orders Details Patient Name: Date of Service: [name], Erica Kluver K. 07/21/2020 1:00 PM Medical Record Number: [id number] Patient Account Number: [id number] Date of Birth/Sex: Treating RN: 1970-12-17 (50 y.o. [name] Primary Care Provider: [name] Other Clinician: Referring Provider: Treating Provider/Extender: Marisa Sprinkles in Treatment: 5 Verbal / Phone Orders: No Diagnosis Coding ICD-10 Coding Code Description [phone] Chronic venous hypertension (idiopathic) with ulcer and inflammation of bilateral lower extremity I89.0 Lymphedema, not elsewhere classified M30.0 Polyarteritis nodosa L97.821 Non-pressure chronic ulcer of other part of left lower leg limited to breakdown of skin L97.811 Non-pressure chronic ulcer of other part of right  lower leg  limited to breakdown of skin L03.116 Cellulitis of left lower limb Follow-up Appointments Return Appointment in 1 week. Bathing/ Shower/ Hygiene May shower with protection but do not get wound dressing(s) wet. - May use cast protectors...you can find those at [facility] or [facility] Edema Control - Lymphedema / SCD / Other Lymphedema Pumps. Use Lymphedema pumps on leg(s) 2-3 times a day for 45-60 minutes. If wearing any wraps or hose, do not remove them. Continue exercising as instructed. Elevate legs to the level of the heart or above for 30 minutes daily and/or when sitting, a frequency of: Avoid standing for long periods of time. Exercise regularly Moisturize legs daily. Other Edema Control Orders/Instructions: - wear your juxtalite on the right leg 30-40 mm/Hg. We are wrapping your left leg in 4 layer compression. Non Wound Condition Protect area with: - Protect right medial leg with abd pad under juxtalite wrap!! Wound Treatment Wound #1 - Lower Leg Wound Laterality: Left, Anterior Peri-Wound Care: Triamcinolone 15 (g) 1 x Per Week/15 Days Discharge Instructions: Use triamcinolone 15 (g) as directed Peri-Wound Care: Sween Lotion (Moisturizing lotion) 1 x Per Week/15 Days Discharge Instructions: Apply moisturizing lotion as directed Prim Dressing: KerraCel Ag Gelling Fiber Dressing, 2x2 in (silver alginate) (Generic) 1 x Per Week/15 Days ary Discharge Instructions: Apply silver alginate to wound bed as instructed Secondary Dressing: ABD Pad, 5x9 1 x Per Week/15 Days Discharge Instructions: Apply over primary dressing as directed. Compression Wrap: FourPress (4 layer compression wrap) 1 x Per Week/15 Days Discharge Instructions: Apply four layer compression as directed. Compression Stockings: Circaid Juxta Lite Compression Wrap Left Leg Compression Amount: 30-40 mmHG Discharge Instructions: Apply Circaid Juxta Lite Compression Wrap daily as instructed. Apply first thing in the morning,  remove at night before bed. Wound #9 - Lower Leg Wound Laterality: Left, Posterior Peri-Wound Care: Triamcinolone 15 (g) 1 x Per Week/15 Days Discharge Instructions: Use triamcinolone 15 (g) as directed Peri-Wound Care: Sween Lotion (Moisturizing lotion) 1 x Per Week/15 Days Discharge Instructions: Apply moisturizing lotion as directed Prim Dressing: KerraCel Ag Gelling Fiber Dressing, 2x2 in (silver alginate) (Generic) 1 x Per Week/15 Days ary Discharge Instructions: Apply silver alginate to wound bed as instructed Secondary Dressing: ABD Pad, 5x9 1 x Per Week/15 Days Discharge Instructions: Apply over primary dressing as directed. Compression Wrap: FourPress (4 layer compression wrap) 1 x Per Week/15 Days Discharge Instructions: Apply four layer compression as directed. Electronic Signature(s) Signed: 07/22/2020 8:28:17 AM By: Worthy Keeler PA-C Signed: 07/22/2020 5:36:09 PM By: [name] RN Entered By: [name] on 07/21/2020 14:23:31 -------------------------------------------------------------------------------- Problem List Details Patient Name: Date of Service: Erica Devoid K. 07/21/2020 1:00 PM Medical Record Number: [id number] Patient Account Number: [id number] Date of Birth/Sex: Treating RN: [birth year] (50 y.o. [name] Primary Care Provider: [name] Other Clinician: Referring Provider: Treating Provider/Extender: Marisa Sprinkles in Treatment: 5 Active Problems ICD-10 Encounter Code Description Active Date MDM Diagnosis I87.333 Chronic venous hypertension (idiopathic) with ulcer and inflammation of 06/16/2020 No Yes bilateral lower extremity I89.0 Lymphedema, not elsewhere classified 06/16/2020 No Yes M30.0 Polyarteritis nodosa 06/16/2020 No Yes L97.821 Non-pressure chronic ulcer of other part of left lower leg limited to breakdown 06/16/2020 No Yes of skin L97.811 Non-pressure chronic ulcer of other part of right  lower leg limited to breakdown 06/16/2020 No Yes of skin L03.116 Cellulitis of left lower limb 07/07/2020 No Yes Inactive Problems Resolved Problems Electronic Signature(s) Signed: 07/21/2020 2:16:34 PM By: Worthy Keeler PA-C  Entered By: Worthy Keeler on 07/21/2020 14:16:33 -------------------------------------------------------------------------------- Progress Note Details Patient Name: Date of Service: [name], [name] 07/21/2020 1:00 PM Medical Record Number: [id number] Patient Account Number: [id number] Date of Birth/Sex: Treating RN: 09-Oct-1970 (50 y.o. [name] Primary Care Provider: [name] Other Clinician: Referring Provider: Treating Provider/Extender: Marisa Sprinkles in Treatment: 5 Subjective Chief Complaint Information obtained from Patient 06/16/2020; patient is here for review of wounds on her bilateral lower legs left greater than right History of Present Illness (HPI) ADMISSION 06/16/2020 This is a 50 year old complicated woman. She states she has had chronic swelling and draining from her legs for many many years. She has been prescribed compression stockings in the past but does not wear them because they are too tight. She has been dealing with open areas on the left and the right leg for several weeks now. There is drainage coming out of the area on the left leg. She saw her primary earlier this month and was put on Augmentin. A culture was apparently done remotely showing MSSA. More recently she has been of applying Neosporin and Bactroban. She has 4 open areas on the left and 3 on the right. Patient also has a complicated medical and dermatologic history. She has a history of cutaneous polyarteritis nodosa apparently diagnosed by a biopsies of her skin in the lower legs. She was on immunosuppressive's with Humira for a while but not currently. She also has psoriasis. Was followed by dermatology at [facility] healthcare Dr. Romona Curls. Her notes were very helpful to review. Also has morbid obesity continuing to continuing to contribute to both venous and lymphatic issues. In the past she is just simply been unable to tolerate compression stockings. She is onSkyriza For her psoriasis. She was hospitalized in March of this year for cellulitis of both legs with sepsis. Past medical history includes psoriasis, hyperlipidemia, chronic lower extremity edema as described, cutaneous polyarteritis nodosa, sleep apnea fibromyalgia, osteoarthritis of both knees Patient's ABIs in our clinic were 0.96 on the right and 1.05 on the left she had venous reflux studies in May 2018. At that point on the right she had normal reflux times noted in the greater saphenous vein with no evidence of DVT No indirect evidence of obstruction proximal to the inguinal ligament. No evidence . of chronic venous insufficiency bilaterally and no evidence of a DVT 2/8; is a patient I admitted to the clinic last week. I thought she had venous insufficiency ulcers. Perhaps mostly lymphedema. She did tolerate the 3 layer compression well smaller. Most problematic areas on the left anterior mid tibia. There there is wounds right across from medial to lateral. Also a band of chronic venous stasis.. We used Hydrofera Blue on the wounds on the right leg wounds are not as dramatic. Small open areas anteriorly and medially. As noted she tolerated 3 layer compression well 2/15; patient's wounds are not any better on the left leg one of the areas on the right seems still closed her edema control with 3 layers is simply not good enough. We have been using Hydrofera Blue 2/22; patient's wounds on the right leg are healed. Still an area on the left anterior mid tibia area that has small open areas. She has what looks to be a cellulitis with complicating intertrigo surrounding the popliteal fossa on the left as well. They tell me that her primary doctor put her on  Diflucan I think for intertrigo in the abdomen. She  does not yet have her juxta lite stockings 3/1; the patient's wounds on the bilateral lower legs are healed and she actually has bilateral juxta lite stockings that got delivered to her neighbors where she did not bring them today. The other issue today is that she felt that her left ankle wrap was too tight so she cut it off her foot course she has localized swelling in her foot with some degree of erythema and tenderness but really no open wounds nevertheless we will have to wrap this leg again to make sure that this does not break down further. She has also done compression for the obligatory 4 weeks. Hopefully she will be eligible for external compression pumps. Were going to try to order these today 07/21/2020 upon evaluation today patient appears to be doing a little worse in regard to her left leg as compared to previous. Fortunately there is no evidence of active infection at this time which is great news. She did have her wrap slipped down on her yesterday and she came in to be seen she did have a couple areas where it looks like it was somewhat bruised and rings around her leg where the slipped. Other than that however she seems to be doing quite well. There is no signs of active infection at this time which is great news. No fevers, chills, nausea, vomiting, or diarrhea. Objective Constitutional Well-nourished and well-hydrated in no acute distress. Vitals Time Taken: 1:40 PM, Height: 62 in, Weight: 304 lbs, BMI: 55.6, Temperature: 98.2 F, Pulse: 75 bpm, Respiratory Rate: 18 breaths/min, Blood Pressure: 139/69 mmHg. Respiratory normal breathing without difficulty. Psychiatric this patient is able to make decisions and demonstrates good insight into disease process. Alert and Oriented x 3. pleasant and cooperative. General Notes: Upon inspection patient's wound bed actually showed signs of good granulation epithelization at this  point. There does not appear to be any evidence of active infection which is great news and overall I am very pleased with where things stand. No fevers, chills, nausea, vomiting, or diarrhea. Integumentary (Hair, Skin) Wound #1 status is Open. Original cause of wound was Gradually Appeared. The date acquired was: 05/18/2020. The wound has been in treatment 5 weeks. The wound is located on the Left,Anterior Lower Leg. The wound measures 0.5cm length x 0.5cm width x 0.1cm depth; 0.196cm^2 area and 0.02cm^3 volume. There is no tunneling or undermining noted. There is a medium amount of serosanguineous drainage noted. The wound margin is distinct with the outline attached to the wound base. There is large (67-100%) red granulation within the wound bed. Wound #9 status is Open. Original cause of wound was Gradually Appeared. The date acquired was: 07/21/2020. The wound is located on the Left,Posterior Lower Leg. The wound measures 2.2cm length x 2cm width x 0.1cm depth; 3.456cm^2 area and 0.346cm^3 volume. There is no tunneling or undermining noted. There is a medium amount of serosanguineous drainage noted. The wound margin is distinct with the outline attached to the wound base. There is large (67-100%) red, pink granulation within the wound bed. There is no necrotic tissue within the wound bed. Assessment Active Problems ICD-10 Chronic venous hypertension (idiopathic) with ulcer and inflammation of bilateral lower extremity Lymphedema, not elsewhere classified Polyarteritis nodosa Non-pressure chronic ulcer of other part of left lower leg limited to breakdown of skin Non-pressure chronic ulcer of other part of right lower leg limited to breakdown of skin Cellulitis of left lower limb Procedures Wound #1 Pre-procedure diagnosis of Wound #1 is  a Venous Leg Ulcer located on the Left,Anterior Lower Leg . There was a Four Layer Compression Therapy Procedure by [name], RN. Post procedure  Diagnosis Wound #1: Same as Pre-Procedure Wound #9 Pre-procedure diagnosis of Wound #9 is a Venous Leg Ulcer located on the Left,Posterior Lower Leg . There was a Four Layer Compression Therapy Procedure by [name], RN. Post procedure Diagnosis Wound #9: Same as Pre-Procedure Plan Follow-up Appointments: Return Appointment in 1 week. Bathing/ Shower/ Hygiene: May shower with protection but do not get wound dressing(s) wet. - May use cast protectors...you can find those at [facility] or [facility] Edema Control - Lymphedema / SCD / Other: Lymphedema Pumps. Use Lymphedema pumps on leg(s) 2-3 times a day for 45-60 minutes. If wearing any wraps or hose, do not remove them. Continue exercising as instructed. Elevate legs to the level of the heart or above for 30 minutes daily and/or when sitting, a frequency of: Avoid standing for long periods of time. Exercise regularly Moisturize legs daily. Other Edema Control Orders/Instructions: - wear your juxtalite on the right leg 30-40 mm/Hg. We are wrapping your left leg in 4 layer compression. Non Wound Condition: Protect area with: - Protect right medial leg with abd pad under juxtalite wrap!! WOUND #1: - Lower Leg Wound Laterality: Left, Anterior Peri-Wound Care: Triamcinolone 15 (g) 1 x Per Week/15 Days Discharge Instructions: Use triamcinolone 15 (g) as directed Peri-Wound Care: Sween Lotion (Moisturizing lotion) 1 x Per Week/15 Days Discharge Instructions: Apply moisturizing lotion as directed Prim Dressing: KerraCel Ag Gelling Fiber Dressing, 2x2 in (silver alginate) (Generic) 1 x Per Week/15 Days ary Discharge Instructions: Apply silver alginate to wound bed as instructed Secondary Dressing: ABD Pad, 5x9 1 x Per Week/15 Days Discharge Instructions: Apply over primary dressing as directed. Com pression Wrap: FourPress (4 layer compression wrap) 1 x Per Week/15 Days Discharge Instructions: Apply four layer compression as directed. Com  pression Stockings: Circaid Juxta Lite Compression Wrap Compression Amount: 30-40 mmHg (left) Discharge Instructions: Apply Circaid Juxta Lite Compression Wrap daily as instructed. Apply first thing in the morning, remove at night before bed. WOUND #9: - Lower Leg Wound Laterality: Left, Posterior Peri-Wound Care: Triamcinolone 15 (g) 1 x Per Week/15 Days Discharge Instructions: Use triamcinolone 15 (g) as directed Peri-Wound Care: Sween Lotion (Moisturizing lotion) 1 x Per Week/15 Days Discharge Instructions: Apply moisturizing lotion as directed Prim Dressing: KerraCel Ag Gelling Fiber Dressing, 2x2 in (silver alginate) (Generic) 1 x Per Week/15 Days ary Discharge Instructions: Apply silver alginate to wound bed as instructed Secondary Dressing: ABD Pad, 5x9 1 x Per Week/15 Days Discharge Instructions: Apply over primary dressing as directed. Com pression Wrap: FourPress (4 layer compression wrap) 1 x Per Week/15 Days Discharge Instructions: Apply four layer compression as directed. 1. I am going to recommend that we go ahead and continue with the wound care measures as before using silver alginate. Fortunately there is no evidence of active infection which is great news and overall very pleased with where things stand. 2. I am also can recommend at this point that we have the patient continue to elevate her legs much as possible. She also apparently has lymphedema pumps she should be using those as well. 3. I am also going to suggest that we have the patient continue to monitor for any signs of worsening if anything happens such as her wrap slipping out again she should definitely let us know if she did before as soon as possible. We will see patient back  for reevaluation in 1 week here in the clinic. If anything worsens or changes patient will contact our office for additional recommendations. Electronic Signature(s) Signed: 07/21/2020 2:24:26 PM By: Worthy Keeler PA-C Entered By: Worthy Keeler on 07/21/2020 14:24:25 -------------------------------------------------------------------------------- SuperBill Details Patient Name: Date of Service: [name], Erica Muster. 07/21/2020 Medical Record Number: [id number] Patient Account Number: [id number] Date of Birth/Sex: Treating RN: 08/16/1970 (50 y.o. [name] Primary Care Provider: [name] Other Clinician: Referring Provider: Treating Provider/Extender: Marisa Sprinkles in Treatment: 5 Diagnosis Coding ICD-10 Codes Code Description [phone] Chronic venous hypertension (idiopathic) with ulcer and inflammation of bilateral lower extremity I89.0 Lymphedema, not elsewhere classified M30.0 Polyarteritis nodosa L97.821 Non-pressure chronic ulcer of other part of left lower leg limited to breakdown of skin L97.811 Non-pressure chronic ulcer of other part of right lower leg limited to breakdown of skin L03.116 Cellulitis of left lower limb Facility Procedures CPT4 Code: [id number] Description: (Facility Use Only) [phone] - [place] [facility] LT LEG Modifier: Quantity: 1 Physician Procedures : CPT4 Code Description Modifier [id number] - WC PHYS LEVEL 3 - EST PT ICD-10 Diagnosis Description I87.333 Chronic venous hypertension (idiopathic) with ulcer and inflammation of bilateral lower extremity I89.0 Lymphedema, not elsewhere classified  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

## 2020-07-28 ENCOUNTER — Other Ambulatory Visit: Payer: Self-pay | Admitting: Family Medicine

## 2020-07-28 ENCOUNTER — Encounter (HOSPITAL_BASED_OUTPATIENT_CLINIC_OR_DEPARTMENT_OTHER): Payer: Medicare Other | Admitting: Internal Medicine

## 2020-07-28 ENCOUNTER — Other Ambulatory Visit: Payer: Self-pay

## 2020-07-28 DIAGNOSIS — L97822 Non-pressure chronic ulcer of other part of left lower leg with fat layer exposed: Secondary | ICD-10-CM | POA: Diagnosis not present

## 2020-07-28 DIAGNOSIS — L97221 Non-pressure chronic ulcer of left calf limited to breakdown of skin: Secondary | ICD-10-CM | POA: Diagnosis not present

## 2020-07-28 DIAGNOSIS — I87333 Chronic venous hypertension (idiopathic) with ulcer and inflammation of bilateral lower extremity: Secondary | ICD-10-CM | POA: Diagnosis not present

## 2020-07-29 ENCOUNTER — Other Ambulatory Visit: Payer: Self-pay | Admitting: Family Medicine

## 2020-07-29 DIAGNOSIS — I890 Lymphedema, not elsewhere classified: Secondary | ICD-10-CM

## 2020-07-29 NOTE — Progress Notes (Signed)
[name] ([id number]) Visit Report for 07/28/2020 HPI Details Patient Name: Date of Service: [name], [name] 07/28/2020 8:45 A M Medical Record Number: [id number] Patient Account Number: [id number] Date of Birth/Sex: Treating RN: 01/05/1971 (50 y.o. [name], [name] Primary Care Provider: Annye Macias Other Clinician: Referring Provider: Treating Provider/Extender: [name] in Treatment: 6 History of Present Illness HPI Description: ADMISSION 06/16/2020 This is a 50 year old complicated woman. She states she has had chronic swelling and draining from her legs for many many years. She has been prescribed compression stockings in the past but does not wear them because they are too tight. She has been dealing with open areas on the left and the right leg for several weeks now. There is drainage coming out of the area on the left leg. She saw her primary earlier this month and was put on Augmentin. A culture was apparently done remotely showing MSSA. More recently she has been of applying Neosporin and Bactroban. She has 4 open areas on the left and 3 on the right. Patient also has a complicated medical and dermatologic history. She has a history of cutaneous polyarteritis nodosa apparently diagnosed by a biopsies of her skin in the lower legs. She was on immunosuppressive's with Humira for a while but not currently. She also has psoriasis. Was followed by dermatology at [facility] Dr. Romona Curls. Her notes were very helpful to review. Also has morbid obesity continuing to continuing to contribute to both venous and lymphatic issues. In the past she is just simply been unable to tolerate compression stockings. She is onSkyriza For her psoriasis. She was hospitalized in March of this year for cellulitis of both legs with sepsis. Past medical history includes psoriasis, hyperlipidemia, chronic lower extremity edema as described, cutaneous  polyarteritis nodosa, sleep apnea fibromyalgia, osteoarthritis of both knees Patient's ABIs in our clinic were 0.96 on the right and 1.05 on the left she had venous reflux studies in May 2018. At that point on the right she had normal reflux times noted in the greater saphenous vein with no evidence of DVT No indirect evidence of obstruction proximal to the inguinal ligament. No evidence . of chronic venous insufficiency bilaterally and no evidence of a DVT 2/8; is a patient I admitted to the clinic last week. I thought she had venous insufficiency ulcers. Perhaps mostly lymphedema. She did tolerate the 3 layer compression well smaller. Most problematic areas on the left anterior mid tibia. There there is wounds right across from medial to lateral. Also a band of chronic venous stasis.. We used Hydrofera Blue on the wounds on the right leg wounds are not as dramatic. Small open areas anteriorly and medially. As noted she tolerated 3 layer compression well 2/15; patient's wounds are not any better on the left leg one of the areas on the right seems still closed her edema control with 3 layers is simply not good enough. We have been using Hydrofera Blue 2/22; patient's wounds on the right leg are healed. Still an area on the left anterior mid tibia area that has small open areas. She has what looks to be a cellulitis with complicating intertrigo surrounding the popliteal fossa on the left as well. They tell me that her primary doctor put her on Diflucan I think for intertrigo in the abdomen. She does not yet have her juxta lite stockings 3/1; the patient's wounds on the bilateral lower legs are healed and she actually has bilateral juxta lite  stockings that got delivered to her neighbors where she did not bring them today. The other issue today is that she felt that her left ankle wrap was too tight so she cut it off her foot course she has localized swelling in her foot with some degree of erythema  and tenderness but really no open wounds nevertheless we will have to wrap this leg again to make sure that this does not break down further. She has also done compression for the obligatory 4 weeks. Hopefully she will be eligible for external compression pumps. Were going to try to order these today 07/21/2020 upon evaluation today patient appears to be doing a little worse in regard to her left leg as compared to previous. Fortunately there is no evidence of active infection at this time which is great news. She did have her wrap slipped down on her yesterday and she came in to be seen she did have a couple areas where it looks like it was somewhat bruised and rings around her leg where the slipped. Other than that however she seems to be doing quite well. There is no signs of active infection at this time which is great news. No fevers, chills, nausea, vomiting, or diarrhea. 3/15; patient has poorly controlled lymphedema in both legs. She arrives in clinic today with a juxta lite stocking on the right not on properly. The edema control here is very poor although fortunately she does not have a wound. On the left she has small areas anteriorly x3 and very tiny areas posteriorly. We have tried to look at getting her external compression pumps but so far she has not been able to get in contact with the rep from medical modality Electronic Signature(s) Signed: 07/28/2020 5:48:16 PM By: Erica Ham MD Entered By: [name] on 07/28/2020 10:25:39 -------------------------------------------------------------------------------- Physical Exam Details Patient Name: Date of Service: Erica Devoid K. 07/28/2020 8:45 A M Medical Record Number: [id number] Patient Account Number: [id number] Date of Birth/Sex: Treating RN: [birth year] (50 y.o. [name], [name] Primary Care Provider: Annye Macias Other Clinician: Referring Provider: Treating Provider/Extender: [name] in Treatment: 6 Constitutional Patient is hypertensive.. Pulse regular and within target range for patient.Marland Kitchen Respirations regular, non-labored and within target range.. Temperature is normal and within the target range for the patient.Marland Kitchen Appears in no distress. Cardiovascular Pedal pulses palpable and strong bilaterally.. Notes Wound exam; the wounds on the left anterior and left posterior calf are small but the edema control here is only marginal. Should not appear to have arterial issues. The right leg does not have an open wound but again very significant nonpitting edema. No open wounds on the right. Electronic Signature(s) Signed: 07/28/2020 5:48:16 PM By: Erica Ham MD Entered By: [name] on 07/28/2020 10:26:46 -------------------------------------------------------------------------------- Physician Orders Details Patient Name: Date of Service: Erica Devoid K. 07/28/2020 8:45 A M Medical Record Number: [id number] Patient Account Number: [id number] Date of Birth/Sex: Treating RN: [birth year] (50 y.o. [name], [name] Primary Care Provider: Annye Macias Other Clinician: Referring Provider: Treating Provider/Extender: [name] in Treatment: 6 Verbal / Phone Orders: No Diagnosis Coding ICD-10 Coding Code Description [phone] Chronic venous hypertension (idiopathic) with ulcer and inflammation of bilateral lower extremity I89.0 Lymphedema, not elsewhere classified M30.0 Polyarteritis nodosa L97.821 Non-pressure chronic ulcer of other part of left lower leg limited to breakdown of skin L97.811 Non-pressure chronic ulcer of other part of right lower leg limited to breakdown of skin L03.116 Cellulitis of left  lower limb Follow-up Appointments Return Appointment in 1 week. Bathing/ Shower/ Hygiene May shower with protection but do not get wound dressing(s) wet. - May use cast protectors...you can find those at [facility]  or [facility] Edema Control - Lymphedema / SCD / Other Lymphedema Pumps. Use Lymphedema pumps on leg(s) 2-3 times a day for 45-60 minutes. If wearing any wraps or hose, do not remove them. Continue exercising as instructed. Elevate legs to the level of the heart or above for 30 minutes daily and/or when sitting, a frequency of: Avoid standing for long periods of time. Exercise regularly Moisturize legs daily. Other Edema Control Orders/Instructions: - wear your juxtalite on the right leg 30-40 mm/Hg. We are wrapping your left leg in 4 layer compression. Non Wound Condition Protect area with: - Protect right medial leg with abd pad under juxtalite wrap!! Wound Treatment Wound #1 - Lower Leg Wound Laterality: Left, Anterior Peri-Wound Care: Triamcinolone 15 (g) 1 x Per Week/15 Days Discharge Instructions: Use triamcinolone 15 (g) as directed Peri-Wound Care: Sween Lotion (Moisturizing lotion) 1 x Per Week/15 Days Discharge Instructions: Apply moisturizing lotion as directed Prim Dressing: Hydrofera Blue Classic Foam, 2x2 in 1 x Per Week/15 Days ary Discharge Instructions: Moisten with saline prior to applying to wound bed Secondary Dressing: ABD Pad, 5x9 1 x Per Week/15 Days Discharge Instructions: Apply over primary dressing as directed. Compression Wrap: FourPress (4 layer compression wrap) 1 x Per Week/15 Days Discharge Instructions: Apply four layer compression as directed. Compression Stockings: Circaid Juxta Lite Compression Wrap Left Leg Compression Amount: 30-40 mmHG Discharge Instructions: Apply Circaid Juxta Lite Compression Wrap daily as instructed. Apply first thing in the morning, remove at night before bed. Wound #9 - Lower Leg Wound Laterality: Left, Posterior Peri-Wound Care: Triamcinolone 15 (g) 1 x Per Week/15 Days Discharge Instructions: Use triamcinolone 15 (g) as directed Peri-Wound Care: Sween Lotion (Moisturizing lotion) 1 x Per Week/15 Days Discharge Instructions: Apply  moisturizing lotion as directed Prim Dressing: Hydrofera Blue Classic Foam, 2x2 in (Generic) 1 x Per Week/15 Days ary Discharge Instructions: Moisten with saline prior to applying to wound bed Secondary Dressing: ABD Pad, 5x9 1 x Per Week/15 Days Discharge Instructions: Apply over primary dressing as directed. Compression Wrap: FourPress (4 layer compression wrap) 1 x Per Week/15 Days Discharge Instructions: Apply four layer compression as directed. Electronic Signature(s) Signed: 07/28/2020 5:48:16 PM By: Erica Ham MD Signed: 07/29/2020 5:17:38 PM By: [name] RN Entered By: [name] on 07/28/2020 10:23:54 -------------------------------------------------------------------------------- Problem List Details Patient Name: Date of Service: Erica Devoid K. 07/28/2020 8:45 A M Medical Record Number: [id number] Patient Account Number: [id number] Date of Birth/Sex: Treating RN: 07/22/1970 (50 y.o. [name], [name] Primary Care Provider: Annye Macias Other Clinician: Referring Provider: Treating Provider/Extender: [name] in Treatment: 6 Active Problems ICD-10 Encounter Code Description Active Date MDM Diagnosis I87.333 Chronic venous hypertension (idiopathic) with ulcer and inflammation of 06/16/2020 No Yes bilateral lower extremity I89.0 Lymphedema, not elsewhere classified 06/16/2020 No Yes M30.0 Polyarteritis nodosa 06/16/2020 No Yes L97.821 Non-pressure chronic ulcer of other part of left lower leg limited to breakdown 06/16/2020 No Yes of skin L97.811 Non-pressure chronic ulcer of other part of right lower leg limited to breakdown 06/16/2020 No Yes of skin L03.116 Cellulitis of left lower limb 07/07/2020 No Yes Inactive Problems Resolved Problems Electronic Signature(s) Signed: 07/28/2020 5:48:16 PM By: Erica Ham MD Entered By: [name] on 07/28/2020  10:22:28 -------------------------------------------------------------------------------- Progress Note Details Patient Name: Date of Service: [name],  Erica K. 07/28/2020 8:45 A M Medical Record Number: [id number] Patient Account Number: [id number] Date of Birth/Sex: Treating RN: May 22, 1970 (50 y.o. [name], [name] Primary Care Provider: Annye Macias Other Clinician: Referring Provider: Treating Provider/Extender: [name] in Treatment: 6 Subjective History of Present Illness (HPI) ADMISSION 06/16/2020 This is a 50 year old complicated woman. She states she has had chronic swelling and draining from her legs for many many years. She has been prescribed compression stockings in the past but does not wear them because they are too tight. She has been dealing with open areas on the left and the right leg for several weeks now. There is drainage coming out of the area on the left leg. She saw her primary earlier this month and was put on Augmentin. A culture was apparently done remotely showing MSSA. More recently she has been of applying Neosporin and Bactroban. She has 4 open areas on the left and 3 on the right. Patient also has a complicated medical and dermatologic history. She has a history of cutaneous polyarteritis nodosa apparently diagnosed by a biopsies of her skin in the lower legs. She was on immunosuppressive's with Humira for a while but not currently. She also has psoriasis. Was followed by dermatology at [facility] Dr. Romona Curls. Her notes were very helpful to review. Also has morbid obesity continuing to continuing to contribute to both venous and lymphatic issues. In the past she is just simply been unable to tolerate compression stockings. She is onSkyriza For her psoriasis. She was hospitalized in March of this year for cellulitis of both legs with sepsis. Past medical history includes psoriasis, hyperlipidemia, chronic lower  extremity edema as described, cutaneous polyarteritis nodosa, sleep apnea fibromyalgia, osteoarthritis of both knees Patient's ABIs in our clinic were 0.96 on the right and 1.05 on the left she had venous reflux studies in May 2018. At that point on the right she had normal reflux times noted in the greater saphenous vein with no evidence of DVT No indirect evidence of obstruction proximal to the inguinal ligament. No evidence . of chronic venous insufficiency bilaterally and no evidence of a DVT 2/8; is a patient I admitted to the clinic last week. I thought she had venous insufficiency ulcers. Perhaps mostly lymphedema. She did tolerate the 3 layer compression well smaller. Most problematic areas on the left anterior mid tibia. There there is wounds right across from medial to lateral. Also a band of chronic venous stasis.. We used Hydrofera Blue on the wounds on the right leg wounds are not as dramatic. Small open areas anteriorly and medially. As noted she tolerated 3 layer compression well 2/15; patient's wounds are not any better on the left leg one of the areas on the right seems still closed her edema control with 3 layers is simply not good enough. We have been using Hydrofera Blue 2/22; patient's wounds on the right leg are healed. Still an area on the left anterior mid tibia area that has small open areas. She has what looks to be a cellulitis with complicating intertrigo surrounding the popliteal fossa on the left as well. They tell me that her primary doctor put her on Diflucan I think for intertrigo in the abdomen. She does not yet have her juxta lite stockings 3/1; the patient's wounds on the bilateral lower legs are healed and she actually has bilateral juxta lite stockings that got delivered to her neighbors where she did not bring them today. The other issue  today is that she felt that her left ankle wrap was too tight so she cut it off her foot course she has localized swelling in  her foot with some degree of erythema and tenderness but really no open wounds nevertheless we will have to wrap this leg again to make sure that this does not break down further. She has also done compression for the obligatory 4 weeks. Hopefully she will be eligible for external compression pumps. Were going to try to order these today 07/21/2020 upon evaluation today patient appears to be doing a little worse in regard to her left leg as compared to previous. Fortunately there is no evidence of active infection at this time which is great news. She did have her wrap slipped down on her yesterday and she came in to be seen she did have a couple areas where it looks like it was somewhat bruised and rings around her leg where the slipped. Other than that however she seems to be doing quite well. There is no signs of active infection at this time which is great news. No fevers, chills, nausea, vomiting, or diarrhea. 3/15; patient has poorly controlled lymphedema in both legs. She arrives in clinic today with a juxta lite stocking on the right not on properly. The edema control here is very poor although fortunately she does not have a wound. On the left she has small areas anteriorly x3 and very tiny areas posteriorly. We have tried to look at getting her external compression pumps but so far she has not been able to get in contact with the rep from medical modality Objective Constitutional Patient is hypertensive.. Pulse regular and within target range for patient.Marland Kitchen Respirations regular, non-labored and within target range.. Temperature is normal and within the target range for the patient.Marland Kitchen Appears in no distress. Vitals Time Taken: 9:17 AM, Height: 62 in, Weight: 304 lbs, BMI: 55.6, Temperature: 97.6 F, Pulse: 81 bpm, Respiratory Rate: 18 breaths/min, Blood Pressure: 164/75 mmHg. Cardiovascular Pedal pulses palpable and strong bilaterally.. General Notes: Wound exam; the wounds on the left  anterior and left posterior calf are small but the edema control here is only marginal. Should not appear to have arterial issues. The right leg does not have an open wound but again very significant nonpitting edema. No open wounds on the right. Integumentary (Hair, Skin) Wound #1 status is Open. Original cause of wound was Gradually Appeared. The date acquired was: 05/18/2020. The wound has been in treatment 6 weeks. The wound is located on the Left,Anterior Lower Leg. The wound measures 0.4cm length x 0.4cm width x 0.1cm depth; 0.126cm^2 area and 0.013cm^3 volume. There is Fat Layer (Subcutaneous Tissue) exposed. There is no tunneling or undermining noted. There is a small amount of serosanguineous drainage noted. The wound margin is flat and intact. There is large (67-100%) red granulation within the wound bed. There is no necrotic tissue within the wound bed. Wound #9 status is Open. Original cause of wound was Gradually Appeared. The date acquired was: 07/21/2020. The wound has been in treatment 1 weeks. The wound is located on the Left,Posterior Lower Leg. The wound measures 0.2cm length x 0.3cm width x 0.1cm depth; 0.047cm^2 area and 0.005cm^3 volume. The wound is limited to skin breakdown. There is no tunneling or undermining noted. There is a small amount of serosanguineous drainage noted. The wound margin is flat and intact. There is small (1-33%) pink granulation within the wound bed. There is no necrotic tissue within the wound  bed. Assessment Active Problems ICD-10 Chronic venous hypertension (idiopathic) with ulcer and inflammation of bilateral lower extremity Lymphedema, not elsewhere classified Polyarteritis nodosa Non-pressure chronic ulcer of other part of left lower leg limited to breakdown of skin Non-pressure chronic ulcer of other part of right lower leg limited to breakdown of skin Cellulitis of left lower limb Procedures Wound #1 Pre-procedure diagnosis of Wound #1 is a  Venous Leg Ulcer located on the Left,Anterior Lower Leg . There was a Four Layer Compression Therapy Procedure by [name], RN. Post procedure Diagnosis Wound #1: Same as Pre-Procedure Plan Follow-up Appointments: Return Appointment in 1 week. Bathing/ Shower/ Hygiene: May shower with protection but do not get wound dressing(s) wet. - May use cast protectors...you can find those at [facility] or [facility] Edema Control - Lymphedema / SCD / Other: Lymphedema Pumps. Use Lymphedema pumps on leg(s) 2-3 times a day for 45-60 minutes. If wearing any wraps or hose, do not remove them. Continue exercising as instructed. Elevate legs to the level of the heart or above for 30 minutes daily and/or when sitting, a frequency of: Avoid standing for long periods of time. Exercise regularly Moisturize legs daily. Other Edema Control Orders/Instructions: - wear your juxtalite on the right leg 30-40 mm/Hg. We are wrapping your left leg in 4 layer compression. Non Wound Condition: Protect area with: - Protect right medial leg with abd pad under juxtalite wrap!! WOUND #1: - Lower Leg Wound Laterality: Left, Anterior Peri-Wound Care: Triamcinolone 15 (g) 1 x Per Week/15 Days Discharge Instructions: Use triamcinolone 15 (g) as directed Peri-Wound Care: Sween Lotion (Moisturizing lotion) 1 x Per Week/15 Days Discharge Instructions: Apply moisturizing lotion as directed Prim Dressing: Hydrofera Blue Classic Foam, 2x2 in 1 x Per Week/15 Days ary Discharge Instructions: Moisten with saline prior to applying to wound bed Secondary Dressing: ABD Pad, 5x9 1 x Per Week/15 Days Discharge Instructions: Apply over primary dressing as directed. Com pression Wrap: FourPress (4 layer compression wrap) 1 x Per Week/15 Days Discharge Instructions: Apply four layer compression as directed. Com pression Stockings: Circaid Juxta Lite Compression Wrap Compression Amount: 30-40 mmHg (left) Discharge Instructions: Apply  Circaid Juxta Lite Compression Wrap daily as instructed. Apply first thing in the morning, remove at night before bed. WOUND #9: - Lower Leg Wound Laterality: Left, Posterior Peri-Wound Care: Triamcinolone 15 (g) 1 x Per Week/15 Days Discharge Instructions: Use triamcinolone 15 (g) as directed Peri-Wound Care: Sween Lotion (Moisturizing lotion) 1 x Per Week/15 Days Discharge Instructions: Apply moisturizing lotion as directed Prim Dressing: Hydrofera Blue Classic Foam, 2x2 in (Generic) 1 x Per Week/15 Days ary Discharge Instructions: Moisten with saline prior to applying to wound bed Secondary Dressing: ABD Pad, 5x9 1 x Per Week/15 Days Discharge Instructions: Apply over primary dressing as directed. Com pression Wrap: FourPress (4 layer compression wrap) 1 x Per Week/15 Days Discharge Instructions: Apply four layer compression as directed. 1. Continue with silver alginate 4-layer compression on the left 2. We will go over juxta lites stocking application on the right leg. She has a son at home that should be able to help her with this. I cannot imagine the patient doing this herself 3. She will definitely need lymphedema pumps going forward she has stage III lymphedema with wounds that are proving to be refractory to standard compression. Moreover without adequate edema control she is going to have further skin changes up to and including skin breakdown. Electronic Signature(s) Signed: 07/28/2020 5:48:16 PM By: Erica Ham MD Entered By: Dellia Nims,  Qunicy Higinbotham on 07/28/2020 10:27:54 -------------------------------------------------------------------------------- SuperBill Details Patient Name: Date of Service: [name], [name] 07/28/2020 Medical Record Number: [id number] Patient Account Number: [id number] Date of Birth/Sex: Treating RN: 09-Oct-1970 (50 y.o. [name], [name] Primary Care Provider: Annye Macias Other Clinician: Referring Provider: Treating Provider/Extender: [name] in Treatment: 6 Diagnosis Coding ICD-10 Codes Code Description [phone] Chronic venous hypertension (idiopathic) with ulcer and inflammation of bilateral lower extremity I89.0 Lymphedema, not elsewhere classified M30.0 Polyarteritis nodosa L97.821 Non-pressure chronic ulcer of other part of left lower leg limited to breakdown of skin L97.811 Non-pressure chronic ulcer of other part of right lower leg limited to breakdown of skin L03.116 Cellulitis of left lower limb Physician Procedures : CPT4 Code Description Modifier [id number] - WC PHYS LEVEL 3 - EST PT 1 ICD-10 Diagnosis Description L97.821 Non-pressure chronic ulcer of other part of left lower leg limited to breakdown of skin I87.333 Chronic venous hypertension (idiopathic) with  ulcer and inflammation of bilateral lower extremity I89.0 Lymphedema, not elsewhere classified Quantity: Electronic Signature(s) Signed: 07/28/2020 5:48:16 PM By: Erica Ham MD Entered By: [name] on 07/28/2020 10:28:15

## 2020-07-29 NOTE — Progress Notes (Signed)
DAWSYN, ZURN ([id number]) Visit Report for 07/28/2020 Arrival Information Details Patient Name: Date of Service: HA EMMORY, SOLIVAN 07/28/2020 8:45 A M Medical Record Number: [id number] Patient Account Number: [id number] Date of Birth/Sex: Treating RN: [birth year] (50 y.o. [name], [name] Primary Care Anan Dapolito: [name] Other Clinician: Referring Xana Bradt: Treating Jazmarie Biever/Extender: [name] in Treatment: 6 Visit Information History Since Last Visit Added or deleted any medications: No Patient Arrived: Ambulatory Any new allergies or adverse reactions: No Arrival Time: 09:16 Had a fall or experienced change in No Accompanied By: self activities of daily living that may affect Transfer Assistance: None risk of falls: Patient Identification Verified: Yes Signs or symptoms of abuse/neglect since last visito No Secondary Verification Process Completed: Yes Hospitalized since last visit: No Patient Requires Transmission-Based Precautions: No Implantable device outside of the clinic excluding No Patient Has Alerts: No cellular tissue based products placed in the center since last visit: Has Dressing in Place as Prescribed: Yes Pain Present Now: No Electronic Signature(s) Signed: 07/28/2020 3:44:20 PM By: Sandre Kitty Entered By: Sandre Kitty on 07/28/2020 09:17:09 -------------------------------------------------------------------------------- Compression Therapy Details Patient Name: Date of Service: HA TABBETHA, KUTSCHER 07/28/2020 8:45 A M Medical Record Number: [id number] Patient Account Number: [id number] Date of Birth/Sex: Treating RN: 09-Oct-1970 (50 y.o. [name], [name] Primary Care Jordayn Mink: [name] Other Clinician: Referring Chance Karam: Treating Brendy Ficek/Extender: [name] in Treatment: 6 Compression Therapy Performed for Wound Assessment: Wound #1 Left,Anterior Lower Leg Performed  By: Clinician [name], RN Compression Type: Four Layer Post Procedure Diagnosis Same as Pre-procedure Electronic Signature(s) Signed: 07/29/2020 5:17:38 PM By: [name] RN Entered By: [name] on 07/28/2020 10:22:24 -------------------------------------------------------------------------------- Encounter Discharge Information Details Patient Name: Date of Service: [name] 07/28/2020 8:45 A M Medical Record Number: [id number] Patient Account Number: [id number] Date of Birth/Sex: Treating RN: Feb 21, 1971 (50 y.o. [name], [name] Primary Care Maxximus Gotay: [name] Other Clinician: Referring Warren Kugelman: Treating Guida Asman/Extender: [name] in Treatment: 6 Encounter Discharge Information Items Discharge Condition: Stable Ambulatory Status: Wheelchair Discharge Destination: Home Transportation: Private Auto Schedule Follow-up Appointment: Yes Clinical Summary of Care: Provided on 07/28/2020 Form Type Recipient Paper Patient Patient Electronic Signature(s) Signed: 07/28/2020 11:29:48 AM By: Lorrin Jackson Entered By: Lorrin Jackson on 07/28/2020 11:29:47 -------------------------------------------------------------------------------- Lower Extremity Assessment Details Patient Name: Date of Service: HA SERIAH, BROTZMAN 07/28/2020 8:45 A M Medical Record Number: [id number] Patient Account Number: [id number] Date of Birth/Sex: Treating RN: 25-Oct-1970 (50 y.o. [name] Primary Care Kieanna Rollo: [name] Other Clinician: Referring Dakiya Puopolo: Treating Parth Mccormac/Extender: [name] in Treatment: 6 Edema Assessment Assessed: Shirlyn Goltz: No] Patrice Paradise: No] Edema: [Left: Ye] [Right: s] Calf Left: Right: Point of Measurement: 28 cm From Medial Instep 45 cm Ankle Left: Right: Point of Measurement: 11 cm From Medial Instep 31.8 cm Vascular Assessment Pulses: Dorsalis  Pedis Palpable: [Left:Yes] Electronic Signature(s) Signed: 07/28/2020 5:54:58 PM By: Baruch Gouty RN, BSN Entered By: Baruch Gouty on 07/28/2020 09:33:58 -------------------------------------------------------------------------------- Multi Wound Chart Details Patient Name: Date of Service: [name] 07/28/2020 8:45 A M Medical Record Number: [id number] Patient Account Number: [id number] Date of Birth/Sex: Treating RN: [birth year] (50 y.o. [name], [name] Primary Care Judieth Mckown: [name] Other Clinician: Referring Yazmine Sorey: Treating Jadi Deyarmin/Extender: [name] in Treatment: 6 Vital Signs Height(in): 62 Pulse(bpm): 57 Weight(lbs): 304 Blood Pressure(mmHg): 164/75 Body Mass Index(BMI): 56 Temperature(F): 97.6 Respiratory Rate(breaths/min): 18 Photos: [1:No Photos Left, Anterior Lower Leg] [9:No Photos Left,  Posterior Lower Leg] [N/A:N/A N/A] Wound Location: [1:Gradually Appeared] [9:Gradually Appeared] [N/A:N/A] Wounding Event: [1:Venous Leg Ulcer] [9:Venous Leg Ulcer] [N/A:N/A] Primary Etiology: [1:Lymphedema, Sleep Apnea,] [9:Lymphedema, Sleep Apnea,] [N/A:N/A] Comorbid History: [1:Hypertension, Peripheral Venous Disease, Osteoarthritis, Confinement Disease, Osteoarthritis, Confinement Anxiety 05/18/2020] [9:Hypertension, Peripheral Venous Anxiety 07/21/2020] [N/A:N/A] Date Acquired: [1:6] [9:1] [N/A:N/A] Weeks of Treatment: [1:Open] [9:Open] [N/A:N/A] Wound Status: [1:0.4x0.4x0.1] [9:0.2x0.3x0.1] [N/A:N/A] Measurements L x W x D (cm) [1:0.126] [9:0.047] [N/A:N/A] A (cm) : rea [1:0.013] [9:0.005] [N/A:N/A] Volume (cm) : [1:99.60%] [9:98.60%] [N/A:N/A] % Reduction in Area: [1:99.60%] [9:98.60%] [N/A:N/A] % Reduction in Volume: [1:Full Thickness Without Exposed] [9:Full Thickness Without Exposed] [N/A:N/A] Classification: [1:Support Structures Small] [9:Support Structures Small] [N/A:N/A] Exudate Amount: [1:Serosanguineous]  [9:Serosanguineous] [N/A:N/A] Exudate Type: [1:red, brown] [9:red, brown] [N/A:N/A] Exudate Color: [1:Flat and Intact] [9:Flat and Intact] [N/A:N/A] Wound Margin: [1:Large (67-100%)] [9:Small (1-33%)] [N/A:N/A] Granulation Amount: [1:Red] [9:Pink] [N/A:N/A] Granulation Quality: [1:None Present (0%)] [9:None Present (0%)] [N/A:N/A] Necrotic Amount: [1:Fat Layer (Subcutaneous Tissue): Yes Fascia: No] [N/A:N/A] Exposed Structures: [1:Fascia: No Tendon: No Muscle: No Joint: No Bone: No Medium (34-66%)] [9:Fat Layer (Subcutaneous Tissue): No Tendon: No Muscle: No Joint: No Bone: No Limited to Skin Breakdown Large (67-100%)] [N/A:N/A] Epithelialization: [1:Compression Therapy] [9:N/A] [N/A:N/A] Treatment Notes Electronic Signature(s) Signed: 07/28/2020 5:48:16 PM By: [name] MD Signed: 07/29/2020 5:17:38 PM By: [name] RN Entered By: [name] on 07/28/2020 10:23:33 -------------------------------------------------------------------------------- Multi-Disciplinary Care Plan Details Patient Name: Date of Service: [name] 07/28/2020 8:45 A M Medical Record Number: [id number] Patient Account Number: [id number] Date of Birth/Sex: Treating RN: 22-Nov-1970 (50 y.o. [name], [name] Primary Care Ell Tiso: [name] Other Clinician: Referring Tierria Watson: Treating Lorea Kupfer/Extender: [name] in Treatment: 6 Active Inactive Wound/Skin Impairment Nursing Diagnoses: Impaired tissue integrity Knowledge deficit related to smoking impact on wound healing Goals: Patient will have a decrease in wound volume by X% from date: (specify in notes) Date Initiated: 06/16/2020 Target Resolution Date: 08/09/2020 Goal Status: Active Patient/caregiver will verbalize understanding of skin care regimen Date Initiated: 06/16/2020 Target Resolution Date: 08/11/2020 Goal Status: Active Ulcer/skin breakdown will have a volume reduction of 30% by week  4 Date Initiated: 06/16/2020 Target Resolution Date: 08/13/2020 Goal Status: Active Interventions: Assess patient/caregiver ability to obtain necessary supplies Assess patient/caregiver ability to perform ulcer/skin care regimen upon admission and as needed Assess ulceration(s) every visit Notes: Electronic Signature(s) Signed: 07/29/2020 5:17:38 PM By: [name] RN Entered By: [name] on 07/28/2020 10:24:08 -------------------------------------------------------------------------------- Pain Assessment Details Patient Name: Date of Service: [name] 07/28/2020 8:45 A M Medical Record Number: [id number] Patient Account Number: [id number] Date of Birth/Sex: Treating RN: Jul 14, 1970 (50 y.o. [name], [name] Primary Care Otilio Groleau: [name] Other Clinician: Referring Inmer Nix: Treating Arnetia Bronk/Extender: [name] in Treatment: 6 Active Problems Location of Pain Severity and Description of Pain Patient Has Paino No Site Locations Rate the pain. Current Pain Level: 0 Pain Management and Medication Current Pain Management: Electronic Signature(s) Signed: 07/28/2020 5:54:58 PM By: Baruch Gouty RN, BSN Signed: 07/29/2020 5:17:38 PM By: [name] RN Entered By: Baruch Gouty on 07/28/2020 09:33:41 -------------------------------------------------------------------------------- Patient/Caregiver Education Details Patient Name: Date of Service: [name] 3/15/2022andnbsp8:45 A M Medical Record Number: [id number] Patient Account Number: [id number] Date of Birth/Gender: Treating RN: 1970/11/16 (50 y.o. Benjaman Lobe Primary Care Physician: [name] Other Clinician: Referring Physician: Treating Physician/Extender: [name] in Treatment: 6 Education Assessment Education Provided To: Patient Education Topics Provided Wound/Skin Impairment: Methods:  Explain/Verbal Responses: State  content correctly Electronic Signature(s) Signed: 07/29/2020 5:17:38 PM By: [name] RN Entered By: [name] on 07/28/2020 10:24:21 -------------------------------------------------------------------------------- Wound Assessment Details Patient Name: Date of Service: [name] 07/28/2020 8:45 A M Medical Record Number: [id number] Patient Account Number: [id number] Date of Birth/Sex: Treating RN: 23-Sep-1970 (50 y.o. [name], [name] Primary Care Larkin Alfred: [name] Other Clinician: Referring Jamayah Myszka: Treating Liem Copenhaver/Extender: [name] in Treatment: 6 Wound Status Wound Number: 1 Primary Venous Leg Ulcer Etiology: Wound Location: Left, Anterior Lower Leg Wound Open Wounding Event: Gradually Appeared Status: Date Acquired: 05/18/2020 Comorbid Lymphedema, Sleep Apnea, Hypertension, Peripheral Venous Weeks Of Treatment: 6 History: Disease, Osteoarthritis, Confinement Anxiety Clustered Wound: No Photos Wound Measurements Length: (cm) 0.4 Width: (cm) 0.4 Depth: (cm) 0.1 Area: (cm) 0.126 Volume: (cm) 0.013 % Reduction in Area: 99.6% % Reduction in Volume: 99.6% Epithelialization: Medium (34-66%) Tunneling: No Undermining: No Wound Description Classification: Full Thickness Without Exposed Support Structures Wound Margin: Flat and Intact Exudate Amount: Small Exudate Type: Serosanguineous Exudate Color: red, brown Foul Odor After Cleansing: No Slough/Fibrino No Wound Bed Granulation Amount: Large (67-100%) Exposed Structure Granulation Quality: Red Fascia Exposed: No Necrotic Amount: None Present (0%) Fat Layer (Subcutaneous Tissue) Exposed: Yes Tendon Exposed: No Muscle Exposed: No Joint Exposed: No Bone Exposed: No Treatment Notes Wound #1 (Lower Leg) Wound Laterality: Left, Anterior Cleanser Peri-Wound Care Triamcinolone 15 (g) Discharge Instruction: Use  triamcinolone 15 (g) as directed Sween Lotion (Moisturizing lotion) Discharge Instruction: Apply moisturizing lotion as directed Topical Primary Dressing Hydrofera Blue Classic Foam, 2x2 in Discharge Instruction: Moisten with saline prior to applying to wound bed Secondary Dressing ABD Pad, 5x9 Discharge Instruction: Apply over primary dressing as directed. Secured With Compression Wrap FourPress (4 layer compression wrap) Discharge Instruction: Apply four layer compression as directed. Compression Stockings Circaid Juxta Lite Compression Wrap Quantity: 1 Left Leg Compression Amount: 30-40 mmHg Discharge Instruction: Apply Circaid Juxta Lite Compression Wrap daily as instructed. Apply first thing in the morning, remove at night before bed. Add-Ons Electronic Signature(s) Signed: 07/28/2020 5:27:17 PM By: Sandre Kitty Signed: 07/29/2020 5:17:38 PM By: [name] RN Entered By: Sandre Kitty on 07/28/2020 16:53:23 -------------------------------------------------------------------------------- Wound Assessment Details Patient Name: Date of Service: [name] 07/28/2020 8:45 A M Medical Record Number: [id number] Patient Account Number: [id number] Date of Birth/Sex: Treating RN: Sep 19, 1970 (50 y.o. [name], [name] Primary Care Aprill Banko: [name] Other Clinician: Referring Olamae Ferrara: Treating Johni Narine/Extender: [name] in Treatment: 6 Wound Status Wound Number: 9 Primary Venous Leg Ulcer Etiology: Wound Location: Left, Posterior Lower Leg Wound Open Wounding Event: Gradually Appeared Status: Date Acquired: 07/21/2020 Comorbid Lymphedema, Sleep Apnea, Hypertension, Peripheral Venous Weeks Of Treatment: 1 History: Disease, Osteoarthritis, Confinement Anxiety Clustered Wound: No Photos Wound Measurements Length: (cm) 0.2 Width: (cm) 0.3 Depth: (cm) 0.1 Area: (cm) 0.047 Volume: (cm) 0.005 % Reduction in  Area: 98.6% % Reduction in Volume: 98.6% Epithelialization: Large (67-100%) Tunneling: No Undermining: No Wound Description Classification: Full Thickness Without Exposed Support Structures Wound Margin: Flat and Intact Exudate Amount: Small Exudate Type: Serosanguineous Exudate Color: red, brown Foul Odor After Cleansing: No Slough/Fibrino No Wound Bed Granulation Amount: Small (1-33%) Exposed Structure Granulation Quality: Pink Fascia Exposed: No Necrotic Amount: None Present (0%) Fat Layer (Subcutaneous Tissue) Exposed: No Tendon Exposed: No Muscle Exposed: No Joint Exposed: No Bone Exposed: No Limited to Skin Breakdown Treatment Notes Wound #9 (Lower Leg) Wound Laterality: Left, Posterior Cleanser Peri-Wound Care Triamcinolone 15 (g) Discharge Instruction: Use triamcinolone 15 (g)  as directed Sween Lotion (Moisturizing lotion) Discharge Instruction: Apply moisturizing lotion as directed Topical Primary Dressing Hydrofera Blue Classic Foam, 2x2 in Discharge Instruction: Moisten with saline prior to applying to wound bed Secondary Dressing ABD Pad, 5x9 Discharge Instruction: Apply over primary dressing as directed. Secured With Compression Wrap FourPress (4 layer compression wrap) Discharge Instruction: Apply four layer compression as directed. Compression Stockings Add-Ons Electronic Signature(s) Signed: 07/28/2020 5:27:17 PM By: Sandre Kitty Signed: 07/29/2020 5:17:38 PM By: [name] RN Entered By: Sandre Kitty on 07/28/2020 16:54:50 -------------------------------------------------------------------------------- Vitals Details Patient Name: Date of Service: [name] 07/28/2020 8:45 A M Medical Record Number: [id number] Patient Account Number: [id number] Date of Birth/Sex: Treating RN: November 16, 1970 (50 y.o. [name], [name] Primary Care Kingsten Enfield: [name] Other Clinician: Referring Caliah Kopke: Treating Oneil Behney/Extender:  [name] in Treatment: 6 Vital Signs Time Taken: 09:17 Temperature (F): 97.6 Height (in): 62 Pulse (bpm): 81 Weight (lbs): 304 Respiratory Rate (breaths/min): 18 Body Mass Index (BMI): 55.6 Blood Pressure (mmHg): 164/75 Reference Range: 80 - 120 mg / dl Electronic Signature(s) Signed: 07/28/2020 3:44:20 PM By: Sandre Kitty Entered By: Sandre Kitty on 07/28/2020 09:17:25

## 2020-07-31 ENCOUNTER — Ambulatory Visit: Admit: 2020-07-31 | Discharge: 2020-08-01 | Payer: MEDICARE

## 2020-07-31 DIAGNOSIS — Z79899 Other long term (current) drug therapy: Principal | ICD-10-CM

## 2020-07-31 DIAGNOSIS — M300 Polyarteritis nodosa: Principal | ICD-10-CM

## 2020-07-31 DIAGNOSIS — L409 Psoriasis, unspecified: Principal | ICD-10-CM

## 2020-07-31 LAB — CBC W/ AUTO DIFF
BASOPHILS ABSOLUTE COUNT: 0.1 10*9/L (ref 0.0–0.1)
BASOPHILS RELATIVE PERCENT: 1 %
EOSINOPHILS ABSOLUTE COUNT: 0.5 10*9/L (ref 0.0–0.5)
EOSINOPHILS RELATIVE PERCENT: 7.8 %
HEMATOCRIT: 32.6 % — ABNORMAL LOW (ref 34.0–44.0)
HEMOGLOBIN: 10.9 g/dL — ABNORMAL LOW (ref 11.3–14.9)
LYMPHOCYTES ABSOLUTE COUNT: 1.3 10*9/L (ref 1.1–3.6)
LYMPHOCYTES RELATIVE PERCENT: 18.6 %
MEAN CORPUSCULAR HEMOGLOBIN CONC: 33.5 g/dL (ref 32.0–36.0)
MEAN CORPUSCULAR HEMOGLOBIN: 28.4 pg (ref 25.9–32.4)
MEAN CORPUSCULAR VOLUME: 84.8 fL (ref 77.6–95.7)
MEAN PLATELET VOLUME: 7.1 fL (ref 6.8–10.7)
MONOCYTES ABSOLUTE COUNT: 0.7 10*9/L (ref 0.3–0.8)
MONOCYTES RELATIVE PERCENT: 9.5 %
NEUTROPHILS ABSOLUTE COUNT: 4.4 10*9/L (ref 1.8–7.8)
NEUTROPHILS RELATIVE PERCENT: 63.1 %
NUCLEATED RED BLOOD CELLS: 0 /100{WBCs} (ref <=4)
PLATELET COUNT: 324 10*9/L (ref 150–450)
RED BLOOD CELL COUNT: 3.85 10*12/L — ABNORMAL LOW (ref 3.95–5.13)
RED CELL DISTRIBUTION WIDTH: 14.4 % (ref 12.2–15.2)
WBC ADJUSTED: 6.9 10*9/L (ref 3.6–11.2)

## 2020-07-31 LAB — AST: AST (SGOT): 17 U/L (ref <=34)

## 2020-07-31 LAB — CREATININE
CREATININE: 0.59 mg/dL — ABNORMAL LOW
EGFR CKD-EPI AA FEMALE: >90 mL/min/{1.73_m2} (ref >=60)
EGFR CKD-EPI NON-AA FEMALE: >90 mL/min/{1.73_m2} (ref >=60)

## 2020-07-31 LAB — ALBUMIN: ALBUMIN: 3.5 g/dL (ref 3.4–5.0)

## 2020-07-31 LAB — ALT: ALT (SGPT): 8 U/L — ABNORMAL LOW (ref 10–49)

## 2020-07-31 NOTE — Unmapped (Signed)
Patient Name: Autumn Mcdonald  PCP: ??Frederico Hamman, MD      Chief Compliant: Cutaneous PAN    HPI: Autumn Mcdonald is a 50 y.o. 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 Additional hx of fibromyalgia for which she is taking duloxetine and gabapentin.   Treatment hx:  - Started prednisone taper and then mtx in 11/2014.  - Plaquenil started in 12/2014.   - Started imuran in 07/2015, and titrated up to 150 mg qd w/o sufficient control of her PAN.   - Switched to cellcept 09/2015, titrated up to 1500 mg BID.   - Persistent leg swelling and pain, thought to be due to swelling from prednisone, so tapered off prednisone  - Re-biopsied 08/2016, results consistent with venous stasis disease  vs erythema induratum/nodular vasculitis so started on oral potassium iodide, trental, and receiving intralesional steroid injections starting in November/December 2018  - Due to persistent sx, cellcept discontinued and dapsone started by dermatology. Also received intralesional steroid injections with derm.   -Was evaluated by vascular surgery and no vascular insufficiency was noted, thus her leg swelling has been attributed to lymphedema.   - Plaque psoriasis diagnosed by dermatology 2019. Humira started 08/2017.   - HCQ discontinued 09/2017 due to new dx of psoriasis and unclear benefit for PAN.   - Developed TNF induced psoriasis, so humira discontinued and skyrizi started 03/2019  - Current medication regimen:  skyrizi for psoriasis.     Interim history:  Presents today for f/u.     She has had a couple of falls in the last 2 mo. This has been related to her leg swelling. She was given a boot and compression devices to help with the swelling. She has been getting sores on her legs due to the swelling. Wound clinic is also arranging for a sequential compression device for her to use at home.     Wearing boot and compression somthing for legs. Started having sores on legs, draining. Seeing wound clinic for this. Wound clinic thinks that she is falling due to swelling in legs.   Has fallen twice in the last couple of months.      Legs are ok now. She feels they are stable. Continues trental and this seems helpful. She continues to have redness, swelling, and tenderness of the legs. Has had oozing sores on the legs, though better now.   Continues intermittently getting cellulitis in the legs. Last treated with abx for cellulitis about 4 wks ago. Has not had any interim hospitalizations.    Overall psoriasis seems to be doing well. Continues f/u with derm. She has broken out in a small psoriasis plaque due to being late on a dose of skyrizi. They shipped her dose late because of change in the formulation of the pen.       ROS??: Attests to the above, otherwise, review of ten system is negative.  ????Record Review: Available records were reviewed, including pertinent office visits, labs, and imaging.      Past Medical and Surgical History:  ??  Patient Active Problem List    Diagnosis Date Noted   ??? Melena 02/16/2017   ??? Chronic narcotic use 09/07/2016   ??? Bleeding stomach ulcer 01/20/2016   ??? Acute blood loss anemia 01/14/2016   ??? BRBPR (bright red blood per rectum) 01/14/2016   ??? Neuropathy 12/09/2015   ??? Migraine  headache 11/28/2014   ??? PAN (polyarteritis nodosa) (CMS-HCC) 11/28/2014   ??? Chronic gout of multiple sites 02/25/2014   ??? Gastroesophageal reflux disease 01/08/2014   ??? Leg pain, lateral 12/11/2012   ??? S/P skin biopsy 10/31/2012   ??? Psoriasis 01/09/2012   ??? Degeneration of intervertebral disc 11/30/2011   ??? Cutaneous fungal infection 11/15/2011   ??? DJD (degenerative joint disease) 10/14/2011   ??? Frequent urination 04/13/2011   ??? [name] (obstructive sleep apnea) 11/16/2010   ??? Elevated blood sugar 08/23/2010   ??? Other abnormal glucose 10/08/2009   ??? Contracture of tendon 08/17/2009   ??? Leg pain, right 11/05/2008 ??? Other malaise and fatigue 09/19/2008   ??? Ingrowing nail 06/17/2008   ??? Adverse drug reaction 04/03/2008   ??? Morbid obesity (CMS-HCC) 01/03/2007   ??? Hyperlipidemia 09/22/2006   ??? Pain in joint, lower leg 09/22/2006   ??? Muscle pain 09/20/2006   ??? Fibromyalgia 09/20/2006   ??? Anxiety 06/28/2006   ??? Hypertension 06/28/2006     Past Medical History:   Diagnosis Date   ??? Allergic     See other notes   ??? Arthritis 05/16/2001   ??? Bleeding gastric ulcer    ??? Cervical cancer (CMS-HCC)    ??? Depression    ??? Disorder of skin or subcutaneous tissue 2008    Diagnosised in   ??? Eczema 05/16/2014   ??? Fibromyalgia    ??? Hypertension    ??? Morbid obesity (CMS-HCC)    ??? Psoriasis Last 2 years     Past Surgical History:   Procedure Laterality Date   ??? CESAREAN SECTION  1994   ??? CHOLECYSTECTOMY  March 1994   ??? GASTRIC BYPASS  January 2014   ??? HYSTERECTOMY  June 1998   ??? SKIN BIOPSY  2017     Allergies:   ??  Allergies   Allergen Reactions   ??? Iohexol Hives     Pt treated with PO benedryl   ??? Keflex [Cephalexin] Shortness Of Breath and Hives   ??? Lincomycin Hcl Hives   ??? Lisinopril Hives     Hives  Hives   ??? Sulfa (Sulfonamide Antibiotics) Swelling and Rash   ??? Sulfamethoxazole-Trimethoprim Hives     REACTION: Rash, throat closed, eyes swelled.  Hives  Hives   ??? Aspirin      REACTION: Hives  High Doses only. Causes rash.  High Doses only. Causes rash.   ??? Doxycycline      REACTION: Hives  Hives  Hives   ??? Naproxen      REACTION: Hives  Eyes well  Eyes well   ??? Niacin      REACTION: Swelling, problems breathing   ??? Benzoin Compound Rash   ??? Pneumococcal 23-Val Ps Vaccine Rash     cellulitis     Current Outpatient Medications:  ??  Current Outpatient Medications   Medication Sig Dispense Refill   ??? ALPRAZolam (XANAX) 0.5 MG tablet Take 0.5 mg by mouth.     ??? ARIPiprazole (ABILIFY) 10 MG tablet Take 10 mg by mouth daily.     ??? armodafinil (NUVIGIL) 250 mg tablet Take 250 mg by mouth three (3) times a day.      ??? betamethasone dipropionate (DIPROLENE) 0.05 % ointment Apply topically Two (2) times a day. To affected areas as needed 60 g 5   ??? biotin 10 mg Tab 10 mg per 1 tablet, ORAL, Daily, 0 Refill(s)     ??? buPROPion (WELLBUTRIN XL) 150  MG 24 hr tablet Take 150 mg by mouth.     ??? busPIRone (BUSPAR) 30 MG tablet Take 30 mg by mouth.     ??? calcium citrate (CALCITRATE) 200 mg elem calcium (950 mg) tablet 950 mg per 1 tablet, ORAL, Daily, 60 tablet, 0 Refill(s)     ??? clonazePAM (KLONOPIN) 1 MG tablet TAKE 1 TABLET BY MOUTH EVERY DAY     ??? cyclobenzaprine (FLEXERIL) 10 MG tablet Take 10 mg by mouth once as needed.      ??? DULoxetine (CYMBALTA) 60 MG capsule Take 60 mg by mouth daily. 120 mg daily     ??? ferrous sulfate 325 (65 FE) MG tablet Take 1 tablet (325 mg total) by mouth daily with breakfast.  6   ??? furosemide (LASIX) 80 MG tablet TAKE 1 TABLET BY MOUTH DAILY. can TAKE 1 extra TABLET IF weigh INCREASE by 5lb AND call your doctor FOR further instruction     ??? gabapentin (NEURONTIN) 600 MG tablet Take 2 tablets (1,200 mg total) by mouth Three (3) times a day. 180 tablet 11   ??? HYDROcodone-acetaminophen (NORCO 10-325) 10-325 mg per tablet Take 1 tablet by mouth.     ??? nebivolol (BYSTOLIC) 10 MG tablet Take 10 mg by mouth daily.      ??? nystatin (MYCOSTATIN) 100,000 unit/gram ointment Apply topically Two (2) times a day. 30 g 0   ??? NYSTOP 100,000 unit/gram powder APPLY 4 grams TO THE AFFECTED AREA 4 TIMES DAILY     ??? omeprazole (PRILOSEC) 20 MG capsule Take 1 capsule by mouth daily.     ??? OXcarbazepine (TRILEPTAL) 300 MG tablet Take 300 mg by mouth.     ??? pantoprazole (PROTONIX) 40 MG tablet Take 40 mg by mouth Two (2) times a day. bid     ??? pentoxifylline (TRENTAL) 400 mg CR tablet Take 1 tablet (400 mg total) by mouth Three (3) times a day with a meal. 270 tablet 3   ??? promethazine (PHENERGAN) 25 MG tablet once as needed.      ??? risankizumab-rzaa (SKYRIZI) 150 mg/mL Syrg Inject the contents of 1 syringe (150 mg) under the skin every 12 weeks. 1 mL 1   ??? rosuvastatin (CRESTOR) 20 MG tablet TAKE 1 TABLET BY MOUTH EVERY DAY       No current facility-administered medications for this visit.          PHYSICAL EXAM??  Vitals:    07/31/20 1430   BP: 121/67   Pulse: 74   Temp: 36.2 ??C (97.1 ??F)   TempSrc: Temporal     Weight not done today due to pt in wheelchair.     General:   Pleasant 50 y.o.female in no acute distress, obese    Cardiovascular:  Regular rate and rhythm. No murmur, rub, or gallop. b/l LE edema.    Lungs:  Clear to auscultation.Normal respiratory effort.    Musculoskeletal:   Ambulates w/ wheelchair. Hands w/o swelling. Knees with some restricted flexion due to body habitus, painful ROM with crepitus. Edematous ankles and feet.    Neurological:  CN 2-12 grossly intact. 5/5 strength on extremities.   Psych:  Appropriate affect and mood   Skin:  B/l shins with erythema and lymphedema b/l LE. Peeling of skin on soles of feet. Toes with hyperkeratotic lesions. No psoriatic plaques on exposed skin.         ??GENERAL SUMMARY/IMPRESSION ??AND RECOMMENDATIONS: ??  ??1. PAN vs erythema induratum  Continue pentoxifylline 400 mg  TID.     2. TNF induced Psoriasis  Continue f/u with derm and skyrizi per derm recommendations.     3. High risk medication use  Checking labs below to evaluate for medication toxicity.    - CBC w/ Differential  - Creatinine  - Albumin  - ALT  - AST    4. Obesity  S/p wt loss surgery in 2014. Discussed that her obesity is impacting the swelling in her legs, and she may benefit from weight loss medication or consult with bariatric surgery again. She will discuss with her PCP.     HCM:   - [id number] Status: 10/04/17  - PPSV 23 Status: 03/17/11, 01/10/18  - Annual Influenza vaccine. Status: 03/10/20  - COVID-19 vaccine: Pfizer 08/15/19, 09/09/19, 01/31/20. Offered 4th dose today, but we are out of supply. She will get this locally.   - Bone health: not on prednisone   - Plaquenil eye exam: no longer taking HCQ  - Contraception: did not discuss Greater than 20 minutes spent in visit with patient, including pre and postvisit activities.   Return appt in 2 mo as scheduled with Dr Scarlette Calico

## 2020-08-03 ENCOUNTER — Other Ambulatory Visit: Payer: Self-pay | Admitting: Family Medicine

## 2020-08-03 MED ORDER — HYDROCODONE-ACETAMINOPHEN 10-325 MG PO TABS: 1.0000 | ORAL_TABLET | Freq: Four times a day (QID) | ORAL | 0 refills | Status: DC | PRN

## 2020-08-03 NOTE — Telephone Encounter (Signed)
Last refill 07/09/2020 Last OV 05/19/2020 dx cellulitis

## 2020-08-04 ENCOUNTER — Other Ambulatory Visit: Payer: Self-pay

## 2020-08-04 ENCOUNTER — Encounter (HOSPITAL_BASED_OUTPATIENT_CLINIC_OR_DEPARTMENT_OTHER): Payer: Medicare Other | Admitting: Internal Medicine

## 2020-08-04 DIAGNOSIS — L409 Psoriasis, unspecified: Principal | ICD-10-CM

## 2020-08-04 DIAGNOSIS — L97229 Non-pressure chronic ulcer of left calf with unspecified severity: Secondary | ICD-10-CM | POA: Diagnosis not present

## 2020-08-04 DIAGNOSIS — L97811 Non-pressure chronic ulcer of other part of right lower leg limited to breakdown of skin: Secondary | ICD-10-CM | POA: Diagnosis not present

## 2020-08-04 DIAGNOSIS — I890 Lymphedema, not elsewhere classified: Secondary | ICD-10-CM | POA: Diagnosis not present

## 2020-08-04 DIAGNOSIS — L03116 Cellulitis of left lower limb: Secondary | ICD-10-CM | POA: Diagnosis not present

## 2020-08-04 DIAGNOSIS — L97821 Non-pressure chronic ulcer of other part of left lower leg limited to breakdown of skin: Secondary | ICD-10-CM | POA: Diagnosis not present

## 2020-08-04 DIAGNOSIS — I87333 Chronic venous hypertension (idiopathic) with ulcer and inflammation of bilateral lower extremity: Secondary | ICD-10-CM | POA: Diagnosis not present

## 2020-08-04 DIAGNOSIS — M300 Polyarteritis nodosa: Secondary | ICD-10-CM | POA: Diagnosis not present

## 2020-08-04 MED ORDER — SKYRIZI 150 MG/ML SUBCUTANEOUS SYRINGE
SUBCUTANEOUS | 1 refills | 0.00000 days | Status: CP
Start: 2020-08-04 — End: ?
  Filled 2020-09-02: qty 1, 84d supply, fill #0

## 2020-08-04 NOTE — Progress Notes (Signed)
[name], [name] ([id number]) Visit Report for 08/04/2020 HPI Details Patient Name: Date of Service: [name] 08/04/2020 12:45 PM Medical Record Number: [id number] Patient Account Number: [id number] Date of Birth/Sex: Treating RN: 1970/09/14 (50 y.o. [name], [name] Primary Care Provider: [name] Other Clinician: Referring Provider: Treating Provider/Extender: [name] in Treatment: 7 History of Present Illness HPI Description: ADMISSION 06/16/2020 This is a 50 year old complicated woman. She states she has had chronic swelling and draining from her legs for many many years. She has been prescribed compression stockings in the past but does not wear them because they are too tight. She has been dealing with open areas on the left and the right leg for several weeks now. There is drainage coming out of the area on the left leg. She saw her primary earlier this month and was put on Augmentin. A culture was apparently done remotely showing MSSA. More recently she has been of applying Neosporin and Bactroban. She has 4 open areas on the left and 3 on the right. Patient also has a complicated medical and dermatologic history. She has a history of cutaneous polyarteritis nodosa apparently diagnosed by a biopsies of her skin in the lower legs. She was on immunosuppressive's with Humira for a while but not currently. She also has psoriasis. Was followed by dermatology at [facility] Dr. Romona Curls. Her notes were very helpful to review. Also has morbid obesity continuing to continuing to contribute to both venous and lymphatic issues. In the past she is just simply been unable to tolerate compression stockings. She is onSkyriza For her psoriasis. She was hospitalized in March of this year for cellulitis of both legs with sepsis. Past medical history includes psoriasis, hyperlipidemia, chronic lower extremity edema as described, cutaneous  polyarteritis nodosa, sleep apnea fibromyalgia, osteoarthritis of both knees Patient's ABIs in our clinic were 0.96 on the right and 1.05 on the left she had venous reflux studies in May 2018. At that point on the right she had normal reflux times noted in the greater saphenous vein with no evidence of DVT No indirect evidence of obstruction proximal to the inguinal ligament. No evidence . of chronic venous insufficiency bilaterally and no evidence of a DVT 2/8; is a patient I admitted to the clinic last week. I thought she had venous insufficiency ulcers. Perhaps mostly lymphedema. She did tolerate the 3 layer compression well smaller. Most problematic areas on the left anterior mid tibia. There there is wounds right across from medial to lateral. Also a band of chronic venous stasis.. We used Hydrofera Blue on the wounds on the right leg wounds are not as dramatic. Small open areas anteriorly and medially. As noted she tolerated 3 layer compression well 2/15; patient's wounds are not any better on the left leg one of the areas on the right seems still closed her edema control with 3 layers is simply not good enough. We have been using Hydrofera Blue 2/22; patient's wounds on the right leg are healed. Still an area on the left anterior mid tibia area that has small open areas. She has what looks to be a cellulitis with complicating intertrigo surrounding the popliteal fossa on the left as well. They tell me that her primary doctor put her on Diflucan I think for intertrigo in the abdomen. She does not yet have her juxta lite stockings 3/1; the patient's wounds on the bilateral lower legs are healed and she actually has bilateral juxta lite stockings  that got delivered to her neighbors where she did not bring them today. The other issue today is that she felt that her left ankle wrap was too tight so she cut it off her foot course she has localized swelling in her foot with some degree of erythema  and tenderness but really no open wounds nevertheless we will have to wrap this leg again to make sure that this does not break down further. She has also done compression for the obligatory 4 weeks. Hopefully she will be eligible for external compression pumps. Were going to try to order these today 07/21/2020 upon evaluation today patient appears to be doing a little worse in regard to her left leg as compared to previous. Fortunately there is no evidence of active infection at this time which is great news. She did have her wrap slipped down on her yesterday and she came in to be seen she did have a couple areas where it looks like it was somewhat bruised and rings around her leg where the slipped. Other than that however she seems to be doing quite well. There is no signs of active infection at this time which is great news. No fevers, chills, nausea, vomiting, or diarrhea. 3/15; patient has poorly controlled lymphedema in both legs. She arrives in clinic today with a juxta lite stocking on the right not on properly. The edema control here is very poor although fortunately she does not have a wound. On the left she has small areas anteriorly x3 and very tiny areas posteriorly. We have tried to look at getting her external compression pumps but so far she has not been able to get in contact with the rep from medical modality 3/22; the patient's wounds in the left leg are closed these were the 3 small tiny areas anteriorly and posteriorly. She has stasis dermatitis. We ordered her compression pumps and these are supposed to be arriving soon but she has not yet started them. She has compression stockings Electronic Signature(s) Signed: 08/04/2020 5:50:43 PM By: [name] MD Entered By: [name] on 08/04/2020 13:54:12 -------------------------------------------------------------------------------- Physical Exam Details Patient Name: Date of Service: Erica Gong. 08/04/2020 12:45  PM Medical Record Number: [id number] Patient Account Number: [id number] Date of Birth/Sex: Treating RN: 1970-05-30 (50 y.o. [name], [name] Primary Care Provider: [name] Other Clinician: Referring Provider: Treating Provider/Extender: [name] in Treatment: 7 Constitutional Sitting or standing Blood Pressure is within target range for patient.. Pulse regular and within target range for patient.Marland Kitchen Respirations regular, non-labored and within target range.. Temperature is normal and within the target range for the patient.Marland Kitchen Appears in no distress. Notes Wound exam; the areas on the left anterior and left posterior calf are all closed. Her edema control is marginal she has stasis dermatitis no tenderness. She has a juxta lite stocking on the right leg Electronic Signature(s) Signed: 08/04/2020 5:50:43 PM By: [name] MD Entered By: [name] on 08/04/2020 13:55:03 -------------------------------------------------------------------------------- Physician Orders Details Patient Name: Date of Service: Erica Gong. 08/04/2020 12:45 PM Medical Record Number: [id number] Patient Account Number: [id number] Date of Birth/Sex: Treating RN: 1970/06/12 (50 y.o. [name], [name] Primary Care Provider: [name] Other Clinician: Referring Provider: Treating Provider/Extender: [name] in Treatment: 7 Verbal / Phone Orders: No Diagnosis Coding Follow-up Appointments Other: - No need to follow up!!:) Please call us ifyou have any concerns!!:) Discharge From [facility] Services Discharge from [place] Edema Control - Lymphedema /  SCD / Other Lymphedema Pumps. Use Lymphedema pumps on leg(s) 2-3 times a day for 45-60 minutes. If wearing any wraps or hose, do not remove them. Continue exercising as instructed. Elevate legs to the level of the heart or above for 30 minutes daily and/or when sitting, a  frequency of: Avoid standing for long periods of time. Moisturize legs daily. Compression stocking or Garment 30-40 mm/Hg pressure to: - Apply your juxta lite's in the morning and remove at night!!:) Electronic Signature(s) Signed: 08/04/2020 5:23:06 PM By: [name] RN Signed: 08/04/2020 5:50:43 PM By: [name] MD Entered By: [name] on 08/04/2020 13:37:59 -------------------------------------------------------------------------------- Problem List Details Patient Name: Date of Service: Erica Gong. 08/04/2020 12:45 PM Medical Record Number: [id number] Patient Account Number: [id number] Date of Birth/Sex: Treating RN: [birth year] (50 y.o. [name], [name] Primary Care Provider: [name] Other Clinician: Referring Provider: Treating Provider/Extender: [name] in Treatment: 7 Active Problems ICD-10 Encounter Code Description Active Date MDM Diagnosis I87.333 Chronic venous hypertension (idiopathic) with ulcer and inflammation of 06/16/2020 No Yes bilateral lower extremity I89.0 Lymphedema, not elsewhere classified 06/16/2020 No Yes M30.0 Polyarteritis nodosa 06/16/2020 No Yes L97.821 Non-pressure chronic ulcer of other part of left lower leg limited to breakdown 06/16/2020 No Yes of skin L97.811 Non-pressure chronic ulcer of other part of right lower leg limited to breakdown 06/16/2020 No Yes of skin L03.116 Cellulitis of left lower limb 07/07/2020 No Yes Inactive Problems Resolved Problems Electronic Signature(s) Signed: 08/04/2020 5:50:43 PM By: [name] MD Entered By: [name] on 08/04/2020 13:53:08 -------------------------------------------------------------------------------- Progress Note Details Patient Name: Date of Service: Erica Gong. 08/04/2020 12:45 PM Medical Record Number: [id number] Patient Account Number: [id number] Date of Birth/Sex: Treating RN: Sep 18, 1970 (50 y.o. [name],  [name] Primary Care Provider: [name] Other Clinician: Referring Provider: Treating Provider/Extender: [name] in Treatment: 7 Subjective History of Present Illness (HPI) ADMISSION 06/16/2020 This is a 50 year old complicated woman. She states she has had chronic swelling and draining from her legs for many many years. She has been prescribed compression stockings in the past but does not wear them because they are too tight. She has been dealing with open areas on the left and the right leg for several weeks now. There is drainage coming out of the area on the left leg. She saw her primary earlier this month and was put on Augmentin. A culture was apparently done remotely showing MSSA. More recently she has been of applying Neosporin and Bactroban. She has 4 open areas on the left and 3 on the right. Patient also has a complicated medical and dermatologic history. She has a history of cutaneous polyarteritis nodosa apparently diagnosed by a biopsies of her skin in the lower legs. She was on immunosuppressive's with Humira for a while but not currently. She also has psoriasis. Was followed by dermatology at [facility] Dr. Romona Curls. Her notes were very helpful to review. Also has morbid obesity continuing to continuing to contribute to both venous and lymphatic issues. In the past she is just simply been unable to tolerate compression stockings. She is onSkyriza For her psoriasis. She was hospitalized in March of this year for cellulitis of both legs with sepsis. Past medical history includes psoriasis, hyperlipidemia, chronic lower extremity edema as described, cutaneous polyarteritis nodosa, sleep apnea fibromyalgia, osteoarthritis of both knees Patient's ABIs in our clinic were 0.96 on the right and 1.05 on the left she had venous reflux studies  in May 2018. At that point on the right she had normal reflux times noted in the greater  saphenous vein with no evidence of DVT No indirect evidence of obstruction proximal to the inguinal ligament. No evidence . of chronic venous insufficiency bilaterally and no evidence of a DVT 2/8; is a patient I admitted to the clinic last week. I thought she had venous insufficiency ulcers. Perhaps mostly lymphedema. She did tolerate the 3 layer compression well smaller. Most problematic areas on the left anterior mid tibia. There there is wounds right across from medial to lateral. Also a band of chronic venous stasis.. We used Hydrofera Blue on the wounds on the right leg wounds are not as dramatic. Small open areas anteriorly and medially. As noted she tolerated 3 layer compression well 2/15; patient's wounds are not any better on the left leg one of the areas on the right seems still closed her edema control with 3 layers is simply not good enough. We have been using Hydrofera Blue 2/22; patient's wounds on the right leg are healed. Still an area on the left anterior mid tibia area that has small open areas. She has what looks to be a cellulitis with complicating intertrigo surrounding the popliteal fossa on the left as well. They tell me that her primary doctor put her on Diflucan I think for intertrigo in the abdomen. She does not yet have her juxta lite stockings 3/1; the patient's wounds on the bilateral lower legs are healed and she actually has bilateral juxta lite stockings that got delivered to her neighbors where she did not bring them today. The other issue today is that she felt that her left ankle wrap was too tight so she cut it off her foot course she has localized swelling in her foot with some degree of erythema and tenderness but really no open wounds nevertheless we will have to wrap this leg again to make sure that this does not break down further. She has also done compression for the obligatory 4 weeks. Hopefully she will be eligible for external compression pumps. Were  going to try to order these today 07/21/2020 upon evaluation today patient appears to be doing a little worse in regard to her left leg as compared to previous. Fortunately there is no evidence of active infection at this time which is great news. She did have her wrap slipped down on her yesterday and she came in to be seen she did have a couple areas where it looks like it was somewhat bruised and rings around her leg where the slipped. Other than that however she seems to be doing quite well. There is no signs of active infection at this time which is great news. No fevers, chills, nausea, vomiting, or diarrhea. 3/15; patient has poorly controlled lymphedema in both legs. She arrives in clinic today with a juxta lite stocking on the right not on properly. The edema control here is very poor although fortunately she does not have a wound. On the left she has small areas anteriorly x3 and very tiny areas posteriorly. We have tried to look at getting her external compression pumps but so far she has not been able to get in contact with the rep from medical [employer] 3/22; the patient's wounds in the left leg are closed these were the 3 small tiny areas anteriorly and posteriorly. She has stasis dermatitis. We ordered her compression pumps and these are supposed to be arriving soon but she has not yet  started them. She has compression stockings Objective Constitutional Sitting or standing Blood Pressure is within target range for patient.. Pulse regular and within target range for patient.Marland Kitchen Respirations regular, non-labored and within target range.. Temperature is normal and within the target range for the patient.Marland Kitchen Appears in no distress. Vitals Time Taken: 1:07 PM, Height: 62 in, Weight: 304 lbs, BMI: 55.6, Temperature: 98.7 F, Pulse: 86 bpm, Respiratory Rate: 18 breaths/min, Blood Pressure: 100/56 mmHg. General Notes: Wound exam; the areas on the left anterior and left posterior calf are all  closed. Her edema control is marginal she has stasis dermatitis no tenderness. She has a juxta lite stocking on the right leg Integumentary (Hair, Skin) Wound #1 status is Open. Original cause of wound was Gradually Appeared. The date acquired was: 05/18/2020. The wound has been in treatment 7 weeks. The wound is located on the Left,Anterior Lower Leg. The wound measures 0cm length x 0cm width x 0cm depth; 0cm^2 area and 0cm^3 volume. There is no tunneling or undermining noted. There is a none present amount of drainage noted. The wound margin is flat and intact. There is no granulation within the wound bed. There is no necrotic tissue within the wound bed. Wound #9 status is Open. Original cause of wound was Gradually Appeared. The date acquired was: 07/21/2020. The wound has been in treatment 2 weeks. The wound is located on the Left,Posterior Lower Leg. The wound measures 0cm length x 0cm width x 0cm depth; 0cm^2 area and 0cm^3 volume. There is no tunneling or undermining noted. There is a none present amount of drainage noted. The wound margin is flat and intact. There is no granulation within the wound bed. There is no necrotic tissue within the wound bed. Assessment Active Problems ICD-10 Chronic venous hypertension (idiopathic) with ulcer and inflammation of bilateral lower extremity Lymphedema, not elsewhere classified Polyarteritis nodosa Non-pressure chronic ulcer of other part of left lower leg limited to breakdown of skin Non-pressure chronic ulcer of other part of right lower leg limited to breakdown of skin Cellulitis of left lower limb Plan Follow-up Appointments: Other: - No need to follow up!!:) Please call us ifyou have any concerns!!:) Discharge From [facility] Services: Discharge from [place] Edema Control - Lymphedema / SCD / Other: Lymphedema Pumps. Use Lymphedema pumps on leg(s) 2-3 times a day for 45-60 minutes. If wearing any wraps or hose, do not remove them.  Continue exercising as instructed. Elevate legs to the level of the heart or above for 30 minutes daily and/or when sitting, a frequency of: Avoid standing for long periods of time. Moisturize legs daily. Compression stocking or Garment 30-40 mm/Hg pressure to: - Apply your juxta lite's in the morning and remove at night!!:) 1. The patient can be discharged to her own juxta lite stockings to both legs 2. She has her lymphedema pumps arriving shortly. 3. I talked to her about edema control which is really entirely in her hands. Also mention skin lubrication, exercise/walking, leg elevation etc. Electronic Signature(s) Signed: 08/04/2020 5:50:43 PM By: [name] MD Entered By: [name] on 08/04/2020 13:55:50 -------------------------------------------------------------------------------- SuperBill Details Patient Name: Date of Service: Erica Gong 08/04/2020 Medical Record Number: [id number] Patient Account Number: [id number] Date of Birth/Sex: Treating RN: [birth year] (50 y.o. [name], [name] Primary Care Provider: [name] Other Clinician: Referring Provider: Treating Provider/Extender: [name] in Treatment: 7 Diagnosis Coding ICD-10 Codes Code Description [phone] Chronic venous hypertension (idiopathic) with ulcer and inflammation of bilateral lower  extremity I89.0 Lymphedema, not elsewhere classified M30.0 Polyarteritis nodosa L97.821 Non-pressure chronic ulcer of other part of left lower leg limited to breakdown of skin L97.811 Non-pressure chronic ulcer of other part of right lower leg limited to breakdown of skin L03.116 Cellulitis of left lower limb Facility Procedures CPT4 Code: [id number] Description: 63335 - WOUND CARE VISIT-LEV 3 EST PT Modifier: Quantity: 1 Physician Procedures : CPT4 Code Description Modifier [id number] - WC PHYS LEVEL 2 - EST PT ICD-10 Diagnosis Description L97.821 Non-pressure chronic  ulcer of other part of left lower leg limited to breakdown of skin Quantity: 1 Electronic Signature(s) Signed: 08/04/2020 5:50:43 PM By: [name] MD Entered By: [name] on 08/04/2020 13:56:12

## 2020-08-06 ENCOUNTER — Other Ambulatory Visit: Payer: Self-pay | Admitting: Family Medicine

## 2020-08-06 NOTE — Progress Notes (Signed)
Erica, Macias ([id number]) Visit Report for 08/04/2020 Arrival Information Details Patient Name: Date of Service: [name], [name] 08/04/2020 12:45 PM Medical Record Number: [id number] Patient Account Number: [id number] Date of Birth/Sex: Treating RN: 07-May-1971 (50 y.o. [name] Primary Care Zayveon Raschke: [name] Other Clinician: Referring Kaetlyn Noa: Treating Tereso Unangst/Extender: [name] in Treatment: 7 Visit Information History Since Last Visit Added or deleted any medications: No Patient Arrived: Wheel Chair Any new allergies or adverse reactions: No Arrival Time: 13:04 Had a fall or experienced change in No Accompanied By: alone activities of daily living that may affect Transfer Assistance: None risk of falls: Patient Identification Verified: Yes Signs or symptoms of abuse/neglect since last visito No Secondary Verification Process Completed: Yes Hospitalized since last visit: No Patient Requires Transmission-Based Precautions: No Implantable device outside of the clinic excluding No Patient Has Alerts: No cellular tissue based products placed in the center since last visit: Has Dressing in Place as Prescribed: No Has Compression in Place as Prescribed: No Pain Present Now: No Electronic Signature(s) Signed: 08/06/2020 5:04:27 PM By: Levan Hurst RN, BSN Entered By: Levan Hurst on 08/04/2020 13:06:37 -------------------------------------------------------------------------------- Clinic Level of Care Assessment Details Patient Name: Date of Service: Erica REMEDIOS, Macias 08/04/2020 12:45 PM Medical Record Number: [id number] Patient Account Number: [id number] Date of Birth/Sex: Treating RN: 10/11/1970 (50 y.o. [name], [name] Primary Care Kassi Esteve: [name] Other Clinician: Referring Arlin Savona: Treating Jaleen Finch/Extender: [name] in Treatment: 7 Clinic Level of Care Assessment  Items TOOL 4 Quantity Score X- 1 0 Use when only an EandM is performed on FOLLOW-UP visit ASSESSMENTS - Nursing Assessment / Reassessment X- 1 10 Reassessment of Co-morbidities (includes updates in patient status) X- 1 5 Reassessment of Adherence to Treatment Plan ASSESSMENTS - Wound and Skin A ssessment / Reassessment X - Simple Wound Assessment / Reassessment - one wound 1 5 []  - 0 Complex Wound Assessment / Reassessment - multiple wounds X- 1 10 Dermatologic / Skin Assessment (not related to wound area) ASSESSMENTS - Focused Assessment X- 1 5 Circumferential Edema Measurements - multi extremities X- 1 10 Nutritional Assessment / Counseling / Intervention []  - 0 Lower Extremity Assessment (monofilament, tuning fork, pulses) []  - 0 Peripheral Arterial Disease Assessment (using hand held doppler) ASSESSMENTS - Ostomy and/or Continence Assessment and Care []  - 0 Incontinence Assessment and Management []  - 0 Ostomy Care Assessment and Management (repouching, etc.) PROCESS - Coordination of Care X - Simple Patient / Family Education for ongoing care 1 15 []  - 0 Complex (extensive) Patient / Family Education for ongoing care X- 1 10 Staff obtains Programmer, systems, Records, T Results / Process Orders est []  - 0 Staff telephones HHA, Nursing Homes / Clarify orders / etc []  - 0 Routine Transfer to another Facility (non-emergent condition) []  - 0 Routine Hospital Admission (non-emergent condition) []  - 0 New Admissions / Biomedical engineer / Ordering NPWT Apligraf, etc. , []  - 0 Emergency Hospital Admission (emergent condition) X- 1 10 Simple Discharge Coordination []  - 0 Complex (extensive) Discharge Coordination PROCESS - Special Needs []  - 0 Pediatric / Minor Patient Management []  - 0 Isolation Patient Management []  - 0 Hearing / Language / Visual special needs []  - 0 Assessment of Community assistance (transportation, D/C planning, etc.) []  - 0 Additional  assistance / Altered mentation []  - 0 Support Surface(s) Assessment (bed, cushion, seat, etc.) INTERVENTIONS - Wound Cleansing / Measurement X - Simple Wound Cleansing - one wound 1 5 []  -  0 Complex Wound Cleansing - multiple wounds X- 1 5 Wound Imaging (photographs - any number of wounds) []  - 0 Wound Tracing (instead of photographs) X- 1 5 Simple Wound Measurement - one wound []  - 0 Complex Wound Measurement - multiple wounds INTERVENTIONS - Wound Dressings X - Small Wound Dressing one or multiple wounds 1 10 []  - 0 Medium Wound Dressing one or multiple wounds []  - 0 Large Wound Dressing one or multiple wounds []  - 0 Application of Medications - topical []  - 0 Application of Medications - injection INTERVENTIONS - Miscellaneous []  - 0 External ear exam []  - 0 Specimen Collection (cultures, biopsies, blood, body fluids, etc.) []  - 0 Specimen(s) / Culture(s) sent or taken to Lab for analysis []  - 0 Patient Transfer (multiple staff / Civil Service fast streamer / Similar devices) []  - 0 Simple Staple / Suture removal (25 or less) []  - 0 Complex Staple / Suture removal (26 or more) []  - 0 Hypo / Hyperglycemic Management (close monitor of Blood Glucose) []  - 0 Ankle / Brachial Index (ABI) - do not check if billed separately X- 1 5 Vital Signs Has the patient been seen at the hospital within the last three years: Yes Total Score: 110 Level Of Care: New/Established - Level 3 Electronic Signature(s) Signed: 08/04/2020 5:23:06 PM By: [name] RN Entered By: [name] on 08/04/2020 13:39:38 -------------------------------------------------------------------------------- Lower Extremity Assessment Details Patient Name: Date of Service: Erica TANJA, Macias 08/04/2020 12:45 PM Medical Record Number: [id number] Patient Account Number: [id number] Date of Birth/Sex: Treating RN: [birth year] (50 y.o. [name] Primary Care Alyene Predmore: [name] Other  Clinician: Referring Beatriz Settles: Treating Maitland Muhlbauer/Extender: [name] in Treatment: 7 Edema Assessment Assessed: Shirlyn Goltz: No] Patrice Paradise: No] Edema: [Left: Ye] [Right: s] Calf Left: Right: Point of Measurement: 28 cm From Medial Instep 47 cm Ankle Left: Right: Point of Measurement: 11 cm From Medial Instep 28 cm Vascular Assessment Pulses: Dorsalis Pedis Palpable: [Left:Yes] Electronic Signature(s) Signed: 08/06/2020 5:04:27 PM By: Levan Hurst RN, BSN Entered By: Levan Hurst on 08/04/2020 13:11:50 -------------------------------------------------------------------------------- Multi Wound Chart Details Patient Name: Date of Service: [name] 08/04/2020 12:45 PM Medical Record Number: [id number] Patient Account Number: [id number] Date of Birth/Sex: Treating RN: 1970-11-29 (50 y.o. [name], [name] Primary Care Adabella Stanis: [name] Other Clinician: Referring Roberto Romanoski: Treating Pavan Bring/Extender: [name] in Treatment: 7 Vital Signs Height(in): 23 Pulse(bpm): 61 Weight(lbs): 304 Blood Pressure(mmHg): 100/56 Body Mass Index(BMI): 56 Temperature(F): 98.7 Respiratory Rate(breaths/min): 18 Photos: [1:No Photos Left, Anterior Lower Leg] [9:No Photos Left, Posterior Lower Leg] [N/A:N/A N/A] Wound Location: [1:Gradually Appeared] [9:Gradually Appeared] [N/A:N/A] Wounding Event: [1:Venous Leg Ulcer] [9:Venous Leg Ulcer] [N/A:N/A] Primary Etiology: [1:Lymphedema, Sleep Apnea,] [9:Lymphedema, Sleep Apnea,] [N/A:N/A] Comorbid History: [1:Hypertension, Peripheral Venous Disease, Osteoarthritis, Confinement Anxiety 05/18/2020] [9:Hypertension, Peripheral Venous Disease, Osteoarthritis, Confinement Anxiety 07/21/2020] [N/A:N/A] Date Acquired: [1:7] [9:2] [N/A:N/A] Weeks of Treatment: [1:Open] [9:Open] [N/A:N/A] Wound Status: [1:0x0x0] [9:0x0x0] [N/A:N/A] Measurements L x W x D (cm) [1:0] [9:0] [N/A:N/A] A (cm)  : rea [1:0] [9:0] [N/A:N/A] Volume (cm) : [1:100.00%] [9:100.00%] [N/A:N/A] % Reduction in Area: [1:100.00%] [9:100.00%] [N/A:N/A] % Reduction in Volume: [1:Full Thickness Without Exposed] [9:Full Thickness Without Exposed] [N/A:N/A] Classification: [1:Support Structures None Present] [9:Support Structures None Present] [N/A:N/A] Exudate Amount: [1:Flat and Intact] [9:Flat and Intact] [N/A:N/A] Wound Margin: [1:None Present (0%)] [9:None Present (0%)] [N/A:N/A] Granulation Amount: [1:None Present (0%)] [9:None Present (0%)] [N/A:N/A] Necrotic Amount: [1:Fascia: No] [9:Fascia: No] [N/A:N/A] Exposed Structures: [1:Fat Layer (Subcutaneous Tissue): No  Tendon: No Muscle: No Joint: No Bone: No Large (67-100%)] [9:Fat Layer (Subcutaneous Tissue): No Tendon: No Muscle: No Joint: No Bone: No Large (67-100%)] [N/A:N/A] Treatment Notes Electronic Signature(s) Signed: 08/04/2020 5:23:06 PM By: [name] RN Signed: 08/04/2020 5:50:43 PM By: [name] MD Entered By: [name] on 08/04/2020 13:53:15 -------------------------------------------------------------------------------- Multi-Disciplinary Care Plan Details Patient Name: Date of Service: [name] 08/04/2020 12:45 PM Medical Record Number: [id number] Patient Account Number: [id number] Date of Birth/Sex: Treating RN: 27-Jan-1971 (49 y.o. [name], [name] Primary Care Esperansa Sarabia: [name] Other Clinician: Referring Cobi Delph: Treating  Jon/Extender: [name] in Treatment: 7 Active Inactive Electronic Signature(s) Signed: 08/04/2020 5:23:06 PM By: [name] RN Entered By: [name] on 08/04/2020 13:38:13 -------------------------------------------------------------------------------- Pain Assessment Details Patient Name: Date of Service: [name] 08/04/2020 12:45 PM Medical Record Number: [id number] Patient Account Number: [id number] Date of  Birth/Sex: Treating RN: 1971-02-09 (50 y.o. [name] Primary Care Sherece Gambrill: [name] Other Clinician: Referring  Antolin: Treating Scherry Laverne/Extender: [name] in Treatment: 7 Active Problems Location of Pain Severity and Description of Pain Patient Has Paino No Site Locations Pain Management and Medication Current Pain Management: Electronic Signature(s) Signed: 08/06/2020 5:04:27 PM By: Levan Hurst RN, BSN Entered By: Levan Hurst on 08/04/2020 13:07:30 -------------------------------------------------------------------------------- Patient/Caregiver Education Details Patient Name: Date of Service: [name] 3/22/2022andnbsp12:45 PM Medical Record Number: [id number] Patient Account Number: [id number] Date of Birth/Gender: Treating RN: 1970-10-30 (50 y.o. [name], [name] Primary Care Physician: [name] Other Clinician: Referring Physician: Treating Physician/Extender: [name] in Treatment: 7 Education Assessment Education Provided To: Patient Education Topics Provided Wound/Skin Impairment: Methods: Explain/Verbal Responses: State content correctly Electronic Signature(s) Signed: 08/04/2020 5:23:06 PM By: [name] RN Entered By: [name] on 08/04/2020 13:38:45 -------------------------------------------------------------------------------- Wound Assessment Details Patient Name: Date of Service: [name] 08/04/2020 12:45 PM Medical Record Number: [id number] Patient Account Number: [id number] Date of Birth/Sex: Treating RN: [birth year] (50 y.o. [name] Primary Care Jim Lundin: [name] Other Clinician: Referring Lorissa Kishbaugh: Treating Codi Kertz/Extender: [name] in Treatment: 7 Wound Status Wound Number: 1 Primary Venous Leg Ulcer Etiology: Wound Location: Left, Anterior Lower Leg Wound  Open Wounding Event: Gradually Appeared Status: Date Acquired: 05/18/2020 Comorbid Lymphedema, Sleep Apnea, Hypertension, Peripheral Venous Weeks Of Treatment: 7 History: Disease, Osteoarthritis, Confinement Anxiety Clustered Wound: No Wound Measurements Length: (cm) Width: (cm) Depth: (cm) Area: (cm) Volume: (cm) 0 % Reduction in Area: 100% 0 % Reduction in Volume: 100% 0 Epithelialization: Large (67-100%) 0 Tunneling: No 0 Undermining: No Wound Description Classification: Full Thickness Without Exposed Support Structures Wound Margin: Flat and Intact Exudate Amount: None Present Foul Odor After Cleansing: No Slough/Fibrino No Wound Bed Granulation Amount: None Present (0%) Exposed Structure Necrotic Amount: None Present (0%) Fascia Exposed: No Fat Layer (Subcutaneous Tissue) Exposed: No Tendon Exposed: No Muscle Exposed: No Joint Exposed: No Bone Exposed: No Electronic Signature(s) Signed: 08/06/2020 5:04:27 PM By: Levan Hurst RN, BSN Entered By: Levan Hurst on 08/04/2020 13:12:32 -------------------------------------------------------------------------------- Wound Assessment Details Patient Name: Date of Service: [name] 08/04/2020 12:45 PM Medical Record Number: [id number] Patient Account Number: [id number] Date of Birth/Sex: Treating RN: 01-21-1971 (50 y.o. [name] Primary Care Anberlyn Feimster: [name] Other Clinician: Referring Tylerjames Hoglund: Treating Layton Naves/Extender: [name] in Treatment: 7 Wound Status Wound Number: 9 Primary Venous Leg Ulcer Etiology: Wound Location: Left, Posterior Lower Leg Wound Open Wounding Event: Gradually Appeared Status: Date  Acquired: 07/21/2020 Comorbid Lymphedema, Sleep Apnea, Hypertension, Peripheral Venous Weeks Of Treatment: 2 History: Disease, Osteoarthritis, Confinement Anxiety History: Disease, Osteoarthritis, Confinement Anxiety Clustered Wound: No Wound  Measurements Length: (cm) Width: (cm) Depth: (cm) Area: (cm) Volume: (cm) 0 % Reduction in Area: 100% 0 % Reduction in Volume: 100% 0 Epithelialization: Large (67-100%) 0 Tunneling: No 0 Undermining: No Wound Description Classification: Full Thickness Without Exposed Support Structures Wound Margin: Flat and Intact Exudate Amount: None Present Foul Odor After Cleansing: No Slough/Fibrino No Wound Bed Granulation Amount: None Present (0%) Exposed Structure Necrotic Amount: None Present (0%) Fascia Exposed: No Fat Layer (Subcutaneous Tissue) Exposed: No Tendon Exposed: No Muscle Exposed: No Joint Exposed: No Bone Exposed: No Electronic Signature(s) Signed: 08/06/2020 5:04:27 PM By: Levan Hurst RN, BSN Entered By: Levan Hurst on 08/04/2020 13:12:42 -------------------------------------------------------------------------------- Vitals Details Patient Name: Date of Service: [name] 08/04/2020 12:45 PM Medical Record Number: [id number] Patient Account Number: [id number] Date of Birth/Sex: Treating RN: 11/24/1970 (50 y.o. [name] Primary Care Yunior Jain: [name] Other Clinician: Referring Taurean Ju: Treating Irmgard Rampersaud/Extender: [name] in Treatment: 7 Vital Signs Time Taken: 13:07 Temperature (F): 98.7 Height (in): 62 Pulse (bpm): 86 Weight (lbs): 304 Respiratory Rate (breaths/min): 18 Body Mass Index (BMI): 55.6 Blood Pressure (mmHg): 100/56 Reference Range: 80 - 120 mg / dl Electronic Signature(s) Signed: 08/06/2020 5:04:27 PM By: Levan Hurst RN, BSN Entered By: Levan Hurst on 08/04/2020 13:07:25

## 2020-08-07 ENCOUNTER — Ambulatory Visit (INDEPENDENT_AMBULATORY_CARE_PROVIDER_SITE_OTHER): Payer: Medicare Other | Admitting: Family Medicine

## 2020-08-07 ENCOUNTER — Other Ambulatory Visit: Payer: Self-pay

## 2020-08-07 ENCOUNTER — Encounter: Payer: Self-pay | Admitting: Family Medicine

## 2020-08-07 VITALS — BP 110/68 | HR 74 | Temp 98.6°F | Resp 20 | Ht 62.0 in | Wt 319.4 lb

## 2020-08-07 DIAGNOSIS — L02421 Furuncle of right axilla: Secondary | ICD-10-CM | POA: Diagnosis not present

## 2020-08-07 MED ORDER — AMOXICILLIN-POT CLAVULANATE 875-125 MG PO TABS: 1.0000 | ORAL_TABLET | Freq: Two times a day (BID) | ORAL | 0 refills | Status: DC

## 2020-08-07 MED ORDER — PHENTERMINE HCL 37.5 MG PO CAPS: 37.5000 mg | ORAL_CAPSULE | ORAL | 0 refills | Status: DC

## 2020-08-07 NOTE — Assessment & Plan Note (Signed)
Ongoing issue for pt.  BMI is 58.42.  Rheumatologist at [facility] indicated that she should ask me about weight loss medication to improve her other medical issues.  Given her hx of low blood sugar, I am not comfortable starting Ozempic or similar weight loss medication.  Given that her BP and HR are normal, we can start Phentermine daily but I explained she will still need to work on healthy diet and physical activity as able.

## 2020-08-07 NOTE — Patient Instructions (Signed)
Follow up as scheduled START the Augmentin twice daily for the infection- take w/ food Hot compresses under the arm START the Phentermine once daily in the AM to help w/ weight loss Call with any questions or concerns [place]!!!

## 2020-08-07 NOTE — Progress Notes (Signed)
   Subjective:    Patient ID: [name], female    DOB: [birth year], 50 y.o.   MRN: [id number]  HPI Axillary lump- pt reports a 'red spot' popped up on Monday in R axilla.  Became painful yesterday.  Area is hard, TTP.  No drainage.  This is only area currently.  Morbid obesity- ongoing issue.  Pt's BMI is 58.42  Rheumatologist recommended that she discuss weight loss medication w/ PCP.  Pt has a hx of low blood sugar.  Has normal BP, normal HR  Review of Systems For ROS see HPI   This visit occurred during the SARS-CoV-2 public health emergency.  Safety protocols were in place, including screening questions prior to the visit, additional usage of staff PPE, and extensive cleaning of exam room while observing appropriate contact time as indicated for disinfecting solutions.       Objective:   Physical Exam Vitals reviewed.  Constitutional:      General: She is not in acute distress.    Appearance: She is obese. She is not ill-appearing.  HENT:     Head: Normocephalic and atraumatic.  Cardiovascular:     Rate and Rhythm: Normal rate and regular rhythm.  Pulmonary:     Effort: Pulmonary effort is normal.     Breath sounds: Normal breath sounds.  Skin:    General: Skin is warm and dry.     Findings: Erythema (redness overlying a 50 cent piece sized area of induration w/ TTP but no fluctuance or drainage) present.  Neurological:     General: No focal deficit present.     Mental Status: She is alert and oriented to person, place, and time.  Psychiatric:        Mood and Affect: Mood normal.        Behavior: Behavior normal.        Thought Content: Thought content normal.           Assessment & Plan:  Boil- new.  In axilla.  Very TTP but no fluctuance or area to I&D.  Pt has multiple abx allergies so will start Augmentin as she has safely taken this before.  Encouraged hot compresses to facilitate drainage.  Pt expressed understanding and is in agreement w/ plan.

## 2020-08-12 ENCOUNTER — Encounter: Payer: Self-pay | Admitting: Family Medicine

## 2020-08-15 ENCOUNTER — Other Ambulatory Visit: Payer: Self-pay | Admitting: Family Medicine

## 2020-08-18 ENCOUNTER — Other Ambulatory Visit: Payer: Self-pay | Admitting: Family Medicine

## 2020-08-19 ENCOUNTER — Ambulatory Visit: Payer: Medicare Other | Admitting: Family Medicine

## 2020-08-20 ENCOUNTER — Other Ambulatory Visit: Payer: Self-pay | Admitting: Family Medicine

## 2020-08-20 NOTE — Telephone Encounter (Signed)
Requesting:Xanax 0.5mg  Contract: UDS: Last Visit:08/07/20 Next Visit:09/04/20 Last Refill:06/16/20 30 tabs 0 refills   Please Advise

## 2020-08-26 NOTE — Unmapped (Signed)
Autumn Mcdonald reports her Autumn Mcdonald continues to work well for her. She had some psoriasis flare a few weeks back, but this resolved on its own without using topical steroids. She's had no return of this.    She has recently been in the care of the Wound Care clinic in [facility] for cellulitis on her lower legs. She was taking oral antibiotics (Amox she thinks), and went for dressing changes x 8 weeks. These open sores are now healed up, and she's been discharged from their service for ~ 3 weeks. I advised we'd want to let Dr. Charlsie Merles know if this is persistent, since Skyrizi may contribute to decreased wound healing.    [facility] Clinical Assessment & Refill Coordination Note    Autumn Mcdonald, DOB: 1970-07-15  Phone: [phone] (home)     All above HIPAA information was verified with patient.     Was a Nurse, learning disability used for this call? No    Specialty Medication(s):   Inflammatory Disorders: Skyrizi     Current Outpatient Medications   Medication Sig Dispense Refill   ??? ALPRAZolam (XANAX) 0.5 MG tablet Take 0.5 mg by mouth.     ??? ARIPiprazole (ABILIFY) 10 MG tablet Take 10 mg by mouth daily.     ??? armodafinil (NUVIGIL) 250 mg tablet Take 250 mg by mouth three (3) times a day.      ??? biotin 10 mg Tab 10 mg per 1 tablet, ORAL, Daily, 0 Refill(s)     ??? buPROPion (WELLBUTRIN XL) 150 MG 24 hr tablet Take 150 mg by mouth.     ??? busPIRone (BUSPAR) 30 MG tablet Take 30 mg by mouth.     ??? calcium citrate (CALCITRATE) 200 mg elem calcium (950 mg) tablet 950 mg per 1 tablet, ORAL, Daily, 60 tablet, 0 Refill(s)     ??? clonazePAM (KLONOPIN) 1 MG tablet TAKE 1 TABLET BY MOUTH EVERY DAY     ??? cyclobenzaprine (FLEXERIL) 10 MG tablet Take 10 mg by mouth once as needed.      ??? DULoxetine (CYMBALTA) 60 MG capsule Take 60 mg by mouth daily. 120 mg daily     ??? ferrous sulfate 325 (65 FE) MG tablet Take 1 tablet (325 mg total) by mouth daily with breakfast.  6   ??? furosemide (LASIX) 80 MG tablet TAKE 1 TABLET BY MOUTH DAILY. can TAKE 1 extra TABLET IF weigh INCREASE by 5lb AND call your doctor FOR further instruction     ??? gabapentin (NEURONTIN) 600 MG tablet Take 2 tablets (1,200 mg total) by mouth Three (3) times a day. 180 tablet 11   ??? HYDROcodone-acetaminophen (NORCO 10-325) 10-325 mg per tablet Take 1 tablet by mouth.     ??? nebivolol (BYSTOLIC) 10 MG tablet Take 10 mg by mouth daily.      ??? nystatin (MYCOSTATIN) 100,000 unit/gram ointment Apply topically Two (2) times a day. 30 g 0   ??? NYSTOP 100,000 unit/gram powder APPLY 4 grams TO THE AFFECTED AREA 4 TIMES DAILY     ??? omeprazole (PRILOSEC) 20 MG capsule Take 1 capsule by mouth daily.     ??? OXcarbazepine (TRILEPTAL) 300 MG tablet Take 300 mg by mouth.     ??? pantoprazole (PROTONIX) 40 MG tablet Take 40 mg by mouth Two (2) times a day. bid     ??? pentoxifylline (TRENTAL) 400 mg CR tablet Take 1 tablet (400 mg total) by mouth Three (3) times a day with a meal. 270  tablet 3   ??? promethazine (PHENERGAN) 25 MG tablet once as needed.      ??? risankizumab-rzaa (SKYRIZI) 150 mg/mL Syrg Inject the contents of 1 syringe (150 mg) under the skin every 12 weeks. 1 mL 1   ??? rosuvastatin (CRESTOR) 20 MG tablet TAKE 1 TABLET BY MOUTH EVERY DAY       No current facility-administered medications for this visit.        Changes to medications: Liesa reports no changes at this time.    Allergies   Allergen Reactions   ??? Iohexol Hives     Pt treated with PO benedryl   ??? Keflex [Cephalexin] Shortness Of Breath and Hives   ??? Lincomycin Hcl Hives   ??? Lisinopril Hives     Hives  Hives   ??? Sulfa (Sulfonamide Antibiotics) Swelling and Rash   ??? Sulfamethoxazole-Trimethoprim Hives     REACTION: Rash, throat closed, eyes swelled.  Hives  Hives   ??? Aspirin      REACTION: Hives  High Doses only. Causes rash.  High Doses only. Causes rash.   ??? Doxycycline      REACTION: Hives  Hives  Hives   ??? Naproxen      REACTION: Hives  Eyes well  Eyes well   ??? Niacin      REACTION: Swelling, problems breathing   ??? Benzoin Compound Rash   ??? Pneumococcal 23-Val Ps Vaccine Rash     cellulitis       Changes to allergies: No    SPECIALTY MEDICATION ADHERENCE     Skyrizi 150 mg: 0 left  Medication Adherence    Patient reported X missed doses in the last month: 0  Specialty Medication: Skyrizi          Specialty medication(s) dose(s) confirmed: Regimen is correct and unchanged.     Are there any concerns with adherence? No    Adherence counseling provided? Not needed    CLINICAL MANAGEMENT AND INTERVENTION      Clinical Benefit Assessment:    Do you feel the medicine is effective or helping your condition? Yes    Clinical Benefit counseling provided? Not needed    Adverse Effects Assessment:    Are you experiencing any side effects? No    Are you experiencing difficulty administering your medicine? No    Quality of Life Assessment:    How many days over the past month did your psoriasis  keep you from your normal activities? For example, brushing your teeth or getting up in the morning. 0    Have you discussed this with your provider? Not needed    Therapy Appropriateness:    Is therapy appropriate? Yes, therapy is appropriate and should be continued    DISEASE/MEDICATION-SPECIFIC INFORMATION      For patients on injectable medications: Patient currently has 0 doses left.  Next injection is scheduled for 4/25.    PATIENT SPECIFIC NEEDS     - Does the patient have any physical, cognitive, or cultural barriers? No    - Is the patient high risk? No    - Does the patient require a Care Management Plan? No     - Does the patient require physician intervention or other additional services (i.e. nutrition, smoking cessation, social work)? No      SHIPPING     Specialty Medication(s) to be Shipped:   Inflammatory Disorders: Skyrizi    Other medication(s) to be shipped: No additional medications requested for fill at this  time     Changes to insurance: No    Delivery Scheduled: Yes, Expected medication delivery date: Thurs, 4/21. Medication will be delivered via UPS to the confirmed prescription address in [facility].    The patient will receive a drug information handout for each medication shipped and additional FDA Medication Guides as required.  Verified that patient has previously received a Conservation officer, historic buildings.    All of the patient's questions and concerns have been addressed.    [name]   [facility] Specialty Pharmacist

## 2020-08-27 ENCOUNTER — Other Ambulatory Visit: Payer: Self-pay | Admitting: Family Medicine

## 2020-08-31 ENCOUNTER — Other Ambulatory Visit: Payer: Self-pay | Admitting: Family Medicine

## 2020-08-31 DIAGNOSIS — F419 Anxiety disorder, unspecified: Secondary | ICD-10-CM

## 2020-08-31 DIAGNOSIS — F32A Depression, unspecified: Secondary | ICD-10-CM

## 2020-08-31 NOTE — Telephone Encounter (Signed)
Patient is requesting a refill of the following medications: Requested Prescriptions   Pending Prescriptions Disp Refills  . HYDROcodone-acetaminophen (NORCO) 10-325 MG tablet 90 tablet 0    Sig: Take 1 tablet by mouth every 6 (six) hours as needed.    Date of patient request: 08/27/2020 Last office visit: 08/07/2020 Date of last refill: 08/03/2020 Last refill amount: 90 tablets Follow up time period per chart: 09/04/2020

## 2020-08-31 NOTE — Telephone Encounter (Signed)
Can you review pain medication pt requesting refill

## 2020-09-01 ENCOUNTER — Other Ambulatory Visit: Payer: Self-pay | Admitting: Family Medicine

## 2020-09-01 ENCOUNTER — Other Ambulatory Visit: Payer: Self-pay

## 2020-09-01 DIAGNOSIS — E785 Hyperlipidemia, unspecified: Secondary | ICD-10-CM

## 2020-09-01 DIAGNOSIS — I10 Essential (primary) hypertension: Secondary | ICD-10-CM

## 2020-09-01 DIAGNOSIS — E876 Hypokalemia: Secondary | ICD-10-CM

## 2020-09-01 MED ORDER — HYDROCODONE-ACETAMINOPHEN 10-325 MG PO TABS: 1.0000 | ORAL_TABLET | Freq: Four times a day (QID) | ORAL | 0 refills | Status: DC | PRN

## 2020-09-01 NOTE — Telephone Encounter (Signed)
Patient is requesting a refill of the following medications: Requested Prescriptions   Pending Prescriptions Disp Refills  . HYDROcodone-acetaminophen (NORCO) 10-325 MG tablet 90 tablet 0    Sig: Take 1 tablet by mouth every 6 (six) hours as needed.    Date of patient request: 08/31/2020 Last office visit: 08/07/2020 Date of last refill: 08/03/2020 Last refill amount: 90 tablets  Follow up time period per chart: 09/04/2020

## 2020-09-01 NOTE — Telephone Encounter (Signed)
LFD 08/03/20 #90 with no refills LOV 08/07/20 NOV 09/04/20

## 2020-09-04 ENCOUNTER — Other Ambulatory Visit: Payer: Self-pay

## 2020-09-04 ENCOUNTER — Encounter: Payer: Self-pay | Admitting: Family Medicine

## 2020-09-04 ENCOUNTER — Ambulatory Visit (INDEPENDENT_AMBULATORY_CARE_PROVIDER_SITE_OTHER): Payer: Medicare Other | Admitting: Family Medicine

## 2020-09-04 VITALS — BP 128/70 | HR 86 | Temp 98.4°F | Resp 20 | Ht 62.0 in | Wt 318.2 lb

## 2020-09-04 DIAGNOSIS — Z1211 Encounter for screening for malignant neoplasm of colon: Secondary | ICD-10-CM | POA: Diagnosis not present

## 2020-09-04 DIAGNOSIS — K760 Fatty (change of) liver, not elsewhere classified: Secondary | ICD-10-CM

## 2020-09-04 DIAGNOSIS — Z0000 Encounter for general adult medical examination without abnormal findings: Secondary | ICD-10-CM

## 2020-09-04 DIAGNOSIS — E871 Hypo-osmolality and hyponatremia: Secondary | ICD-10-CM

## 2020-09-04 LAB — CBC WITH DIFFERENTIAL/PLATELET
Basophils Absolute: 0 10*3/uL (ref 0.0–0.1)
Basophils Relative: 0.7 % (ref 0.0–3.0)
Eosinophils Absolute: 0.3 10*3/uL (ref 0.0–0.7)
Eosinophils Relative: 4.5 % (ref 0.0–5.0)
HCT: 32.9 % — ABNORMAL LOW (ref 36.0–46.0)
Hemoglobin: 10.8 g/dL — ABNORMAL LOW (ref 12.0–15.0)
Lymphocytes Relative: 12.1 % (ref 12.0–46.0)
Lymphs Abs: 0.9 10*3/uL (ref 0.7–4.0)
MCHC: 32.9 g/dL (ref 30.0–36.0)
MCV: 84 fl (ref 78.0–100.0)
Monocytes Absolute: 0.8 10*3/uL (ref 0.1–1.0)
Monocytes Relative: 10.6 % (ref 3.0–12.0)
Neutro Abs: 5.2 10*3/uL (ref 1.4–7.7)
Neutrophils Relative %: 72.1 % (ref 43.0–77.0)
Platelets: 281 10*3/uL (ref 150.0–400.0)
RBC: 3.91 Mil/uL (ref 3.87–5.11)
RDW: 14.7 % (ref 11.5–15.5)
WBC: 7.2 10*3/uL (ref 4.0–10.5)

## 2020-09-04 LAB — BASIC METABOLIC PANEL
BUN: 8 mg/dL (ref 6–23)
CO2: 27 mEq/L (ref 19–32)
Calcium: 8.9 mg/dL (ref 8.4–10.5)
Chloride: 89 mEq/L — ABNORMAL LOW (ref 96–112)
Creatinine, Ser: 0.54 mg/dL (ref 0.40–1.20)
GFR: 107.1 mL/min (ref >60.00)
Glucose, Bld: 98 mg/dL (ref 70–99)
Potassium: 3.9 mEq/L (ref 3.5–5.1)
Sodium: 124 mEq/L — ABNORMAL LOW (ref 135–145)

## 2020-09-04 LAB — HEPATIC FUNCTION PANEL
ALT: 10 U/L (ref 0–35)
AST: 10 U/L (ref 0–37)
Albumin: 3.7 g/dL (ref 3.5–5.2)
Alkaline Phosphatase: 142 U/L — ABNORMAL HIGH (ref 39–117)
Bilirubin, Direct: 0.2 mg/dL (ref 0.0–0.3)
Total Bilirubin: 0.5 mg/dL (ref 0.2–1.2)
Total Protein: 7.3 g/dL (ref 6.0–8.3)

## 2020-09-04 LAB — LIPID PANEL
Cholesterol: 107 mg/dL (ref 0–200)
HDL: 46.1 mg/dL (ref >39.00)
LDL Cholesterol: 49 mg/dL (ref 0–99)
NonHDL: 60.68
Total CHOL/HDL Ratio: 2
Triglycerides: 58 mg/dL (ref 0.0–149.0)
VLDL: 11.6 mg/dL (ref 0.0–40.0)

## 2020-09-04 LAB — PROTIME-INR
INR: 1.2 ratio — ABNORMAL HIGH (ref 0.8–1.0)
Prothrombin Time: 13.9 s — ABNORMAL HIGH (ref 9.6–13.1)

## 2020-09-04 LAB — TSH: TSH: 2.72 u[IU]/mL (ref 0.35–4.50)

## 2020-09-04 MED ORDER — FUROSEMIDE 40 MG PO TABS: 40.0000 mg | ORAL_TABLET | Freq: Every day | ORAL | 1 refills | Status: DC

## 2020-09-04 NOTE — Assessment & Plan Note (Signed)
This is pt's most pressing medical issue.  She has had gastric bypass twice and BMI is 58.2.  It is nearly impossible for her to exercise due to deconditioning and size so I stressed the need for healthy diet and exercise as she is able.  Check labs to risk stratify.  Will follow.

## 2020-09-04 NOTE — Assessment & Plan Note (Signed)
Pt's PE unchanged from previous.  She has 2+ tight yet pitting edema of bilateral LEs that are oozing and she is morbidly obese.  Otherwise exam is unremarkable.  She is UTD on mammo, immunizations.  Due for colon cancer screen- she prefers cologuard.  This was ordered.  Check labs.  Anticipatory guidance provided.

## 2020-09-04 NOTE — Progress Notes (Signed)
Subjective:    Patient ID: [name], female    DOB: Sep 03, 1970, 50 y.o.   MRN: [id number]  HPI CPE- UTD on mammo (q2 yrs), flu, Tdap, COVID.  Due for colon cancer screen- pt prefers Cologuard  Reviewed past medical, surgical, family and social histories.  Patient Care Team    Relationship Specialty Notifications Start End  [name], MD PCP - General   04/21/10   Eino Farber, PA-C Consulting Physician Physician Assistant  11/24/15    Comment: [facility]  Blima Dessert, MD Referring Physician Rheumatology  11/24/15   Loa Socks, MD Referring Physician Dermatology  12/21/16   Terrance Mass, MD (Inactive)  Gynecology  12/21/16   Phineas Real Belinda Block, MD (Inactive) Consulting Physician Gynecology  06/13/18   Madelin Rear, [facility] Pharmacist Pharmacist  02/03/20    Comment: [phone]    Health Maintenance  Topic Date Due  . Hepatitis C Screening  12/10/2020 (Originally January 27, 1971)  . COLONOSCOPY (Pts 45-69yrs Insurance coverage will need to be confirmed)  03/17/2021 (Originally 12/05/2014)  . INFLUENZA VACCINE  12/14/2020  . MAMMOGRAM  01/08/2022  . TETANUS/TDAP  07/23/2028  . COVID-19 Vaccine  Completed  . HIV Screening  Completed  . HPV VACCINES  Aged Out      Review of Systems Patient reports no vision/ hearing changes, adenopathy,fever, weight change,  persistant/recurrent hoarseness , swallowing issues, chest pain, palpitations, edema, persistant/recurrent cough, hemoptysis, dyspnea (rest/exertional/paroxysmal nocturnal), gastrointestinal bleeding (melena, rectal bleeding), abdominal pain, significant heartburn, bowel changes, GU symptoms (dysuria, hematuria, incontinence), Gyn symptoms (abnormal  bleeding, pain),  syncope, focal weakness, memory loss, numbness & tingling, skin/hair/nail changes, abnormal bruising or bleeding, anxiety, or depression.   This visit occurred during the SARS-CoV-2 public health emergency.  Safety protocols were in place, including  screening questions prior to the visit, additional usage of staff PPE, and extensive cleaning of exam room while observing appropriate contact time as indicated for disinfecting solutions.       Objective:   Physical Exam General Appearance:    Alert, cooperative, no distress, appears stated age, morbidly obese, sprawled in wheelchair  Head:    Normocephalic, without obvious abnormality, atraumatic  Eyes:    PERRL, conjunctiva/corneas clear, EOM's intact, fundi    benign, both eyes  Ears:    Normal TM's and external ear canals, both ears  Nose:   Nares normal, septum midline, mucosa normal, no drainage    or sinus tenderness  Throat:   Lips, mucosa, and tongue normal; teeth and gums normal  Neck:   Supple, symmetrical, trachea midline, no adenopathy;    Thyroid: no enlargement/tenderness/nodules  Back:     Symmetric, no curvature, ROM normal, no CVA tenderness  Lungs:     Clear to auscultation bilaterally, respirations unlabored  Chest Wall:    No tenderness or deformity   Heart:    Regular rate and rhythm, S1 and S2 normal, no murmur, rub   or gallop  Breast Exam:    Deferred to GYN  Abdomen:     Soft, non-tender, bowel sounds active all four quadrants,    no masses, no organomegaly  Genitalia:    Deferred to GYN  Rectal:    Extremities:   Tight, 2+ pitting edema of LEs bilaterally w/ oozing  Pulses:   2+ and symmetric all extremities  Skin:   Skin color, texture, turgor normal, no rashes or lesions  Lymph nodes:   Cervical, supraclavicular, and axillary nodes normal  Neurologic:   CNII-XII  intact, normal strength, sensation and reflexes    throughout         Assessment & Plan:

## 2020-09-04 NOTE — Patient Instructions (Signed)
Follow up in 6 months to recheck BP and cholesterol We'll notify you of your lab results and make any changes if needed Continue to work on healthy diet and exercise when you can Pay close attention to those legs and call the wound clinic if needed Complete the cologuard as directed Call with any questions or concerns Stay Safe!  Stay Healthy!!!

## 2020-09-07 ENCOUNTER — Other Ambulatory Visit: Payer: Self-pay | Admitting: Family Medicine

## 2020-09-07 NOTE — Telephone Encounter (Signed)
LFD 12/12/19 #45 with 3 refills LOV 09/04/20 NOV 03/04/21

## 2020-09-10 ENCOUNTER — Encounter: Payer: Self-pay | Admitting: Family Medicine

## 2020-09-10 MED ORDER — AMOXICILLIN-POT CLAVULANATE 875-125 MG PO TABS: 1.0000 | ORAL_TABLET | Freq: Two times a day (BID) | ORAL | 0 refills | Status: DC

## 2020-09-11 ENCOUNTER — Other Ambulatory Visit (INDEPENDENT_AMBULATORY_CARE_PROVIDER_SITE_OTHER): Payer: Medicare Other

## 2020-09-11 ENCOUNTER — Other Ambulatory Visit: Payer: Self-pay

## 2020-09-11 DIAGNOSIS — E871 Hypo-osmolality and hyponatremia: Secondary | ICD-10-CM

## 2020-09-11 LAB — BASIC METABOLIC PANEL
BUN: 7 mg/dL (ref 6–23)
CO2: 25 mEq/L (ref 19–32)
Calcium: 9 mg/dL (ref 8.4–10.5)
Chloride: 90 mEq/L — ABNORMAL LOW (ref 96–112)
Creatinine, Ser: 0.5 mg/dL (ref 0.40–1.20)
GFR: 109.09 mL/min (ref >60.00)
Glucose, Bld: 74 mg/dL (ref 70–99)
Potassium: 3.9 mEq/L (ref 3.5–5.1)
Sodium: 124 mEq/L — ABNORMAL LOW (ref 135–145)

## 2020-09-14 ENCOUNTER — Ambulatory Visit (INDEPENDENT_AMBULATORY_CARE_PROVIDER_SITE_OTHER): Payer: Medicare Other

## 2020-09-14 ENCOUNTER — Other Ambulatory Visit: Payer: Self-pay | Admitting: Family Medicine

## 2020-09-14 ENCOUNTER — Encounter: Payer: Self-pay | Admitting: Family Medicine

## 2020-09-14 ENCOUNTER — Other Ambulatory Visit: Payer: Self-pay

## 2020-09-14 VITALS — Ht 62.0 in | Wt 313.0 lb

## 2020-09-14 DIAGNOSIS — Z0000 Encounter for general adult medical examination without abnormal findings: Secondary | ICD-10-CM | POA: Diagnosis not present

## 2020-09-14 DIAGNOSIS — E871 Hypo-osmolality and hyponatremia: Secondary | ICD-10-CM

## 2020-09-14 NOTE — Patient Instructions (Signed)
[name] , Thank you for taking time to complete your Medicare Wellness Visit. I appreciate your ongoing commitment to your health goals. Please review the following plan we discussed and let me know if I can assist you in the future.   Screening recommendations/referrals: Colonoscopy: Cologuard ordered at last office visit. Mammogram: Completed 01/09/2020-Due-01/08/2021 Bone Density: Not yet indicated-Due at age 50. Recommended yearly ophthalmology/optometry visit for glaucoma screening and checkup Recommended yearly dental visit for hygiene and checkup  Vaccinations: Influenza vaccine: Up to date Pneumococcal vaccine: Up to date Tdap vaccine: Up to date-Due-07/23/2028 Shingles vaccine: Discuss with pharmacy  Covid-19: May obtain 2nd booster at your local pharmacy.  Advanced directives: Information mailed today  Conditions/risks identified: See problem list  Next appointment: Follow up in one year for your annual wellness visit.   Preventive Care 40-64 Years, Female Preventive care refers to lifestyle choices and visits with your health care provider that can promote health and wellness. What does preventive care include?  A yearly physical exam. This is also called an annual well check.  Dental exams once or twice a year.  Routine eye exams. Ask your health care provider how often you should have your eyes checked.  Personal lifestyle choices, including:  Daily care of your teeth and gums.  Regular physical activity.  Eating a healthy diet.  Avoiding tobacco and drug use.  Limiting alcohol use.  Practicing safe sex.  Taking low-dose aspirin daily starting at age 38.  Taking vitamin and mineral supplements as recommended by your health care provider. What happens during an annual well check? The services and screenings done by your health care provider during your annual well check will depend on your age, overall health, lifestyle risk factors, and family history of  disease. Counseling  Your health care provider may ask you questions about your:  Alcohol use.  Tobacco use.  Drug use.  Emotional well-being.  Home and relationship well-being.  Sexual activity.  Eating habits.  Work and work Statistician.  Method of birth control.  Menstrual cycle.  Pregnancy history. Screening  You may have the following tests or measurements:  Height, weight, and BMI.  Blood pressure.  Lipid and cholesterol levels. These may be checked every 5 years, or more frequently if you are over 40 years old.  Skin check.  Lung cancer screening. You may have this screening every year starting at age 71 if you have a 30-pack-year history of smoking and currently smoke or have quit within the past 15 years.  Fecal occult blood test (FOBT) of the stool. You may have this test every year starting at age 31.  Flexible sigmoidoscopy or colonoscopy. You may have a sigmoidoscopy every 5 years or a colonoscopy every 10 years starting at age 33.  Hepatitis C blood test.  Hepatitis B blood test.  Sexually transmitted disease (STD) testing.  Diabetes screening. This is done by checking your blood sugar (glucose) after you have not eaten for a while (fasting). You may have this done every 1-3 years.  Mammogram. This may be done every 1-2 years. Talk to your health care provider about when you should start having regular mammograms. This may depend on whether you have a family history of breast cancer.  BRCA-related cancer screening. This may be done if you have a family history of breast, ovarian, tubal, or peritoneal cancers.  Pelvic exam and Pap test. This may be done every 3 years starting at age 34. Starting at age 86, this may be  done every 5 years if you have a Pap test in combination with an HPV test.  Bone density scan. This is done to screen for osteoporosis. You may have this scan if you are at high risk for osteoporosis. Discuss your test results,  treatment options, and if necessary, the need for more tests with your health care provider. Vaccines  Your health care provider may recommend certain vaccines, such as:  Influenza vaccine. This is recommended every year.  Tetanus, diphtheria, and acellular pertussis (Tdap, Td) vaccine. You may need a Td booster every 10 years.  Zoster vaccine. You may need this after age 57.  Pneumococcal 13-valent conjugate (PCV13) vaccine. You may need this if you have certain conditions and were not previously vaccinated.  Pneumococcal polysaccharide (PPSV23) vaccine. You may need one or two doses if you smoke cigarettes or if you have certain conditions. Talk to your health care provider about which screenings and vaccines you need and how often you need them. This information is not intended to replace advice given to you by your health care provider. Make sure you discuss any questions you have with your health care provider. Document Released: 05/29/2015 Document Revised: 01/20/2016 Document Reviewed: 03/03/2015 Elsevier Interactive Patient Education  2017 [place] Prevention in the Home Falls can cause injuries. They can happen to people of all ages. There are many things you can do to make your home safe and to help prevent falls. What can I do on the outside of my home?  Regularly fix the edges of walkways and driveways and fix any cracks.  Remove anything that might make you trip as you walk through a door, such as a raised step or threshold.  Trim any bushes or trees on the path to your home.  Use bright outdoor lighting.  Clear any walking paths of anything that might make someone trip, such as rocks or tools.  Regularly check to see if handrails are loose or broken. Make sure that both sides of any steps have handrails.  Any raised decks and porches should have guardrails on the edges.  Have any leaves, snow, or ice cleared regularly.  Use sand or salt on walking  paths during winter.  Clean up any spills in your garage right away. This includes oil or grease spills. What can I do in the bathroom?  Use night lights.  Install grab bars by the toilet and in the tub and shower. Do not use towel bars as grab bars.  Use non-skid mats or decals in the tub or shower.  If you need to sit down in the shower, use a plastic, non-slip stool.  Keep the floor dry. Clean up any water that spills on the floor as soon as it happens.  Remove soap buildup in the tub or shower regularly.  Attach bath mats securely with double-sided non-slip rug tape.  Do not have throw rugs and other things on the floor that can make you trip. What can I do in the bedroom?  Use night lights.  Make sure that you have a light by your bed that is easy to reach.  Do not use any sheets or blankets that are too big for your bed. They should not hang down onto the floor.  Have a firm chair that has side arms. You can use this for support while you get dressed.  Do not have throw rugs and other things on the floor that can make you trip. What  can I do in the kitchen?  Clean up any spills right away.  Avoid walking on wet floors.  Keep items that you use a lot in easy-to-reach places.  If you need to reach something above you, use a strong step stool that has a grab bar.  Keep electrical cords out of the way.  Do not use floor polish or wax that makes floors slippery. If you must use wax, use non-skid floor wax.  Do not have throw rugs and other things on the floor that can make you trip. What can I do with my stairs?  Do not leave any items on the stairs.  Make sure that there are handrails on both sides of the stairs and use them. Fix handrails that are broken or loose. Make sure that handrails are as long as the stairways.  Check any carpeting to make sure that it is firmly attached to the stairs. Fix any carpet that is loose or worn.  Avoid having throw rugs at  the top or bottom of the stairs. If you do have throw rugs, attach them to the floor with carpet tape.  Make sure that you have a light switch at the top of the stairs and the bottom of the stairs. If you do not have them, ask someone to add them for you. What else can I do to help prevent falls?  Wear shoes that:  Do not have high heels.  Have rubber bottoms.  Are comfortable and fit you well.  Are closed at the toe. Do not wear sandals.  If you use a stepladder:  Make sure that it is fully opened. Do not climb a closed stepladder.  Make sure that both sides of the stepladder are locked into place.  Ask someone to hold it for you, if possible.  Clearly mark and make sure that you can see:  Any grab bars or handrails.  First and last steps.  Where the edge of each step is.  Use tools that help you move around (mobility aids) if they are needed. These include:  Canes.  Walkers.  Scooters.  Crutches.  Turn on the lights when you go into a dark area. Replace any light bulbs as soon as they burn out.  Set up your furniture so you have a clear path. Avoid moving your furniture around.  If any of your floors are uneven, fix them.  If there are any pets around you, be aware of where they are.  Review your medicines with your doctor. Some medicines can make you feel dizzy. This can increase your chance of falling. Ask your doctor what other things that you can do to help prevent falls. This information is not intended to replace advice given to you by your health care provider. Make sure you discuss any questions you have with your health care provider. Document Released: 02/26/2009 Document Revised: 10/08/2015 Document Reviewed: 06/06/2014 Elsevier Interactive Patient Education  2017 [employer].

## 2020-09-14 NOTE — Progress Notes (Signed)
D/C Lasix 80mg  daily, decrease 40mg  to 1/2 tab (20mg ) daily due to low sodium.  Repeat BMP at lab only visit later this week

## 2020-09-14 NOTE — Progress Notes (Signed)
Subjective:   [name] is a 50 y.o. female who presents for Medicare Annual (Subsequent) preventive examination.  I connected with [name] today by telephone and verified that I am speaking with the correct person using two identifiers. Location patient: home Location provider: work Persons participating in the virtual visit: patient, Engineer, civil (consulting).    I discussed the limitations, risks, security and privacy concerns of performing an evaluation and management service by telephone and the availability of in person appointments. I also discussed with the patient that there may be a patient responsible charge related to this service. The patient expressed understanding and verbally consented to this telephonic visit.    Interactive audio and video telecommunications were attempted between this provider and patient, however failed, due to patient having technical difficulties OR patient did not have access to video capability.  We continued and completed visit with audio only.  Some vital signs may be absent or patient reported.   Time Spent with patient on telephone encounter: 25 minutes   Review of Systems     Cardiac Risk Factors include: hypertension;obesity (BMI >30kg/m2);sedentary lifestyle     Objective:    Today's Vitals   09/14/20 1330  Weight: (!) 313 lb (142 kg)  Height: 5\' 2"  (1.575 m)   Body mass index is 57.25 kg/m.  Advanced Directives 09/14/2020 09/11/2019 07/26/2019 06/13/2018 01/05/2017 12/21/2016 01/14/2016  Does Patient Have a Medical Advance Directive? No No No No No No No  Does patient want to make changes to medical advance directive? - Yes (MAU/Ambulatory/Procedural Areas - Information given) - - - - -  Would patient like information on creating a medical advance directive? Yes (MAU/Ambulatory/Procedural Areas - Information given) - Yes (ED - Information included in AVS) Yes (MAU/Ambulatory/Procedural Areas - Information given) Yes (Inpatient - patient defers creating a  medical advance directive at this time) Yes (MAU/Ambulatory/Procedural Areas - Information given) No - patient declined information    Current Medications (verified) Outpatient Encounter Medications as of 09/14/2020  Medication Sig  . ALPRAZolam (XANAX) 0.5 MG tablet TAKE 1 TABLET BY MOUTH 2 TIMES DAILY AS NEEDED FOR ANXIETY  . amoxicillin-clavulanate (AUGMENTIN) 875-125 MG tablet Take 1 tablet by mouth 2 (two) times daily.  . ARIPiprazole (ABILIFY) 10 MG tablet Take 10 mg by mouth in the morning.   . Armodafinil 250 MG tablet 3 (three) times daily. 8am, 2pm, 6pm  . Biotin 10 MG TABS Take 10 mg by mouth in the morning.   11/14/2020 buPROPion (WELLBUTRIN XL) 300 MG 24 hr tablet TAKE 1 TABLET BY MOUTH EVERY DAY  . busPIRone (BUSPAR) 30 MG tablet TAKE 1 TABLET BY MOUTH 2 TIMES DAILY  . calcium citrate (CALCITRATE - DOSED IN MG ELEMENTAL CALCIUM) 950 MG tablet Take 1 tablet by mouth daily.  . clonazePAM (KLONOPIN) 1 MG tablet Take 1 tablet (1 mg total) by mouth at bedtime.  . cyclobenzaprine (FLEXERIL) 10 MG tablet TAKE 1 TABLET BY MOUTH 3 TIMES DAILY AS NEEDED  . DULoxetine (CYMBALTA) 60 MG capsule Take 2 capsules (120 mg total) by mouth daily.  . FEROSUL 325 (65 Fe) MG tablet TAKE 1 TABLET BY MOUTH EVERY DAY WITH BREAKFAST  . furosemide (LASIX) 40 MG tablet Take 1 tablet (40 mg total) by mouth daily. Can take 1 extra tablet if weight increase by 5lb and call your doctor for further instruction.  . gabapentin (NEURONTIN) 600 MG tablet Take 1 tablet (600 mg total) by mouth 3 (three) times daily. TAKE 2 TABLETS BY MOUTH  3 TIMES DAILY  . HYDROcodone-acetaminophen (NORCO) 10-325 MG tablet Take 1 tablet by mouth every 6 (six) hours as needed.  . nebivolol (BYSTOLIC) 10 MG tablet TAKE 1 TABLET BY MOUTH EVERY DAY  . Oxcarbazepine (TRILEPTAL) 300 MG tablet Take 300 mg by mouth 2 (two) times daily.  . pantoprazole (PROTONIX) 40 MG tablet TAKE 1 TABLET BY MOUTH 2 TIMES DAILY  . pentoxifylline (TRENTAL) 400 MG CR  tablet Take 400 mg by mouth 3 (three) times daily with meals.   . phentermine 37.5 MG capsule Take 1 capsule (37.5 mg total) by mouth every morning.  . rosuvastatin (CRESTOR) 20 MG tablet TAKE 1 TABLET BY MOUTH EVERY DAY  . SKYRIZI, 150 MG DOSE, 75 MG/0.83ML PSKT Inject 150 mg into the skin every 3 (three) months.   . clobetasol cream (TEMOVATE) 7.61 % Apply 1 application topically 2 (two) times daily. Cover both legs with Colbetasol cream (Patient not taking: Reported on 08/07/2020)  . collagenase (SANTYL) ointment Apply topically daily. apply Santyl to scabs on legs (Patient not taking: Reported on 08/07/2020)  . fluconazole (DIFLUCAN) 100 MG tablet Take 1 tablet (100 mg total) by mouth daily.  . irbesartan (AVAPRO) 75 MG tablet TAKE 1 TABLET BY MOUTH EVERY DAY (Patient not taking: Reported on 08/07/2020)  . meclizine (ANTIVERT) 25 MG tablet Take 1 tablet (25 mg total) by mouth 3 (three) times daily as needed for dizziness.  . mupirocin ointment (BACTROBAN) 2 % APPLY TOPICALLY 2 TIMES DAILY (Patient not taking: Reported on 08/07/2020)  . nystatin (MYCOSTATIN/NYSTOP) powder Apply 1 application topically 3 (three) times daily.  . promethazine (PHENERGAN) 25 MG tablet TAKE 1 TABLET BY MOUTH EVERY 6 HOURS AS NEEDED FOR NAUSEA AND VOMITING  . senna-docusate (SENOKOT-S) 8.6-50 MG tablet Take 1 tablet by mouth at bedtime. (Patient not taking: No sig reported)  . SUMAtriptan (IMITREX) 100 MG tablet Take 1 tablet (100 mg total) by mouth every 2 (two) hours as needed for migraine. May repeat in 2 hours if headache persists or recurs.  . [DISCONTINUED] furosemide (LASIX) 80 MG tablet TAKE 1 TABLET BY MOUTH DAILY. can TAKE 1 extra TABLET IF weight INCREASE by 5lb AND call your doctor FOR further instruction   No facility-administered encounter medications on file as of 09/14/2020.    Allergies (verified) Niacin, Sulfamethoxazole-trimethoprim, Aspirin, Bactrim, Benzoin compound, Cephalexin,  Clindamycin/lincomycin, Doxycycline, Iohexol, Lisinopril, Naproxen, Pnu-imune [pneumococcal polysaccharide vaccine], and Sulfonamide derivatives   History: Past Medical History:  Diagnosis Date  . Allergic rhinitis   . Ankle fracture, right   . Anxiety   . ASCUS of cervix with negative high risk HPV 05/2018  . Carpal tunnel syndrome   . Cervical cancer ([place]) 1998  . Fibromyalgia   . Hyperlipidemia   . Hypertension   . Migraine headache   . Obesity   . Sleep apnea    Past Surgical History:  Procedure Laterality Date  . ABDOMINAL HYSTERECTOMY  10/1996  . ABDOMINAL SURGERY  jan/11/2012   gastric bypass surgery  . CARPAL TUNNEL RELEASE     bilateral   . CESAREAN SECTION    . CHOLECYSTECTOMY  07/1992  . ESOPHAGOGASTRODUODENOSCOPY N/A 01/14/2016   Procedure: ESOPHAGOGASTRODUODENOSCOPY (EGD);  Surgeon: Mauri Pole, MD;  Location: [facility];  Service: Endoscopy;  Laterality: N/A;   Family History  Problem Relation Age of Onset  . Diabetes Mother   . Emphysema Mother   . Allergies Mother   . Hypertension Mother   . Heart disease Father   .  Allergies Father   . Prostate cancer Father   . Breast cancer Maternal Grandmother 22   Social History   Socioeconomic History  . Marital status: Single    Spouse name: Not on file  . Number of children: Not on file  . Years of education: Not on file  . Highest education level: Not on file  Occupational History  . Occupation: disabled. prev worked as a Haematologist  . Smoking status: Never Smoker  . Smokeless tobacco: Never Used  Vaping Use  . Vaping Use: Never used  Substance and Sexual Activity  . Alcohol use: No  . Drug use: No  . Sexual activity: Yes    Comment: TAH-1st intercourse 42 yo-5 partners  Other Topics Concern  . Not on file  Social History Narrative  . Not on file   Social Determinants of Health   Financial Resource Strain: Low Risk   . Difficulty of Paying Living Expenses: Not hard at  all  Food Insecurity: No Food Insecurity  . Worried About Charity fundraiser in the Last Year: Never true  . Ran Out of Food in the Last Year: Never true  Transportation Needs: No Transportation Needs  . Lack of Transportation (Medical): No  . Lack of Transportation (Non-Medical): No  Physical Activity: Inactive  . Days of Exercise per Week: 0 days  . Minutes of Exercise per Session: 0 min  Stress: No Stress Concern Present  . Feeling of Stress : Only a little  Social Connections: Moderately Isolated  . Frequency of Communication with Friends and Family: More than three times a week  . Frequency of Social Gatherings with Friends and Family: More than three times a week  . Attends Religious Services: More than 4 times per year  . Active Member of Clubs or Organizations: No  . Attends Archivist Meetings: Never  . Marital Status: Never married    Tobacco Counseling Counseling given: Not Answered   Clinical Intake:  Pre-visit preparation completed: Yes  Pain : No/denies pain     Nutritional Status: BMI > 30  Obese Nutritional Risks: None Diabetes: No  How often do you need to have someone help you when you read instructions, pamphlets, or other written materials from your doctor or pharmacy?: 1 - Never  Diabetic?No  Interpreter Needed?: No  Information entered by :: Caroleen Hamman LPN   Activities of Daily Living In your present state of health, do you have any difficulty performing the following activities: 09/14/2020 09/04/2020  Hearing? N N  Vision? N N  Difficulty concentrating or making decisions? N N  Walking or climbing stairs? N N  Dressing or bathing? N N  Doing errands, shopping? N N  Preparing Food and eating ? N -  Using the Toilet? N -  In the past six months, have you accidently leaked urine? N -  Do you have problems with loss of bowel control? N -  Managing your Medications? N -  Managing your Finances? N -  Housekeeping or managing  your Housekeeping? N -  Some recent data might be hidden    Patient Care Team: [name], MD as PCP - General Eino Farber, PA-C as Consulting Physician (Physician Assistant) Blima Dessert, MD as Referring Physician (Rheumatology) Loa Socks, MD as Referring Physician (Dermatology) Terrance Mass, MD (Inactive) (Gynecology) Phineas Real Belinda Block, MD (Inactive) as Consulting Physician (Gynecology) Madelin Rear, [name] as Pharmacist (Pharmacist)  Indicate any recent Medical Services you [phone]  have received from other than [facility] providers in the past year (date may be approximate).     Assessment:   This is a routine wellness examination for [place].  Hearing/Vision screen  Hearing Screening   125Hz  250Hz  500Hz  1000Hz  2000Hz  3000Hz  4000Hz  6000Hz  8000Hz   Right ear:           Left ear:           Comments: No issues  Vision Screening Comments: Wears glasses Lasyteye exam-2 years ago  Dietary issues and exercise activities discussed: Current Exercise Habits: The patient does not participate in regular exercise at present  Goals Addressed   None    Depression Screen PHQ 2/9 Scores 09/14/2020 09/04/2020 08/07/2020 05/19/2020 03/17/2020 12/11/2019 11/07/2019  PHQ - 2 Score 0 0 0 2 0 0 0  PHQ- 9 Score - 0 0 18 0 - -  Exception Documentation - - - - - - -    Fall Risk Fall Risk  09/14/2020 09/04/2020 08/07/2020 05/19/2020 03/17/2020  Falls in the past year? 1 1 0 0 0  Number falls in past yr: 1 0 0 0 0  Injury with Fall? 0 0 0 0 0  Risk for fall due to : - - No Fall Risks No Fall Risks No Fall Risks  Risk for fall due to: Comment - - - - -  Follow up Falls prevention discussed - - - -    FALL RISK PREVENTION PERTAINING TO THE HOME:  Any stairs in or around the home? Yes  If so, are there any without handrails? No  Home free of loose throw rugs in walkways, pet beds, electrical cords, etc? Yes  Adequate lighting in your home to reduce risk of falls? Yes   ASSISTIVE DEVICES  UTILIZED TO PREVENT FALLS:  Life alert? No  Use of a cane, walker or w/c? No  Grab bars in the bathroom? Yes  Shower chair or bench in shower? No  Elevated toilet seat or a handicapped toilet? No   TIMED UP AND GO:  Was the test performed? No . Phone visit   Cognitive Function:Normal cognitive status assessed by  this Nurse Health Advisor. No abnormalities found.       6CIT Screen 09/11/2019  What Year? 0 points  What month? 0 points  What time? 0 points  Count back from 20 0 points  Months in reverse 0 points  Repeat phrase 0 points  Total Score 0    Immunizations Immunization History  Administered Date(s) Administered  . Influenza Split 03/17/2011  . Influenza Whole 02/15/2010  . Influenza,inj,Quad PF,6+ Mos 05/17/2015, 02/23/2016, 02/13/2017, 01/18/2019  . Influenza-Unspecified 02/27/2013, 01/11/2018, 03/10/2020  . PFIZER(Purple Top)SARS-COV-2 Vaccination 08/15/2019, 09/09/2019, 01/31/2020  . Pneumococcal Conjugate-13 10/04/2017  . Pneumococcal Polysaccharide-23 03/17/2011, 01/10/2018  . Td 05/16/2002  . Tdap 07/24/2018    TDAP status: Up to date  Flu Vaccine status: Up to date  Pneumococcal vaccine status: Up to date  Covid-19 vaccine status: Information provided on how to obtain vaccines.  Booster  Qualifies for Shingles Vaccine? Yes   Zostavax completed No   Shingrix Completed?: No.    Education has been provided regarding the importance of this vaccine. Patient has been advised to call insurance company to determine out of pocket expense if they have not yet received this vaccine. Advised may also receive vaccine at local pharmacy or Health Dept. Verbalized acceptance and understanding.  Screening Tests Health Maintenance  Topic Date Due  . Hepatitis C Screening  12/10/2020 (  Originally 02-26-1971)  . COLONOSCOPY (Pts 45-28yrs Insurance coverage will need to be confirmed)  03/17/2021 (Originally 12/05/2014)  . INFLUENZA VACCINE  12/14/2020  . MAMMOGRAM   01/08/2022  . TETANUS/TDAP  07/23/2028  . COVID-19 Vaccine  Completed  . HIV Screening  Completed  . HPV VACCINES  Aged Out    Health Maintenance  There are no preventive care reminders to display for this patient.  Colorectal Cancer screening: Cologuard ordered at last office visit.  Mammogram status: Completed 01/09/2020. Repeat every year  Bone Density status:Not yet indicated  Lung Cancer Screening: (Low Dose CT Chest recommended if Age 79-80 years, 30 pack-year currently smoking OR have quit w/in 15years.) does not qualify.    Additional Screening:  Hepatitis C Screening: does not qualify  Vision Screening: Recommended annual ophthalmology exams for early detection of glaucoma and other disorders of the eye. Is the patient up to date with their annual eye exam?  No  Who is the provider or what is the name of the office in which the patient attends annual eye exams? unsure If pt is not established with a provider, would they like to be referred to a provider to establish care? No .   Dental Screening: Recommended annual dental exams for proper oral hygiene  Community Resource Referral / Chronic Care Management: CRR required this visit?  No   CCM required this visit?  No      Plan:     I have personally reviewed and noted the following in the patient's chart:   . Medical and social history . Use of alcohol, tobacco or illicit drugs  . Current medications and supplements including opioid prescriptions.  . Functional ability and status . Nutritional status . Physical activity . Advanced directives . List of other physicians . Hospitalizations, surgeries, and ER visits in previous 12 months . Vitals . Screenings to include cognitive, depression, and falls . Referrals and appointments  In addition, I have reviewed and discussed with patient certain preventive protocols, quality metrics, and best practice recommendations. A written personalized care plan for  preventive services as well as general preventive health recommendations were provided to patient.   Due to this being a telephonic visit, the after visit summary with patients personalized plan was offered to patient via mail or my-chart. Patient would like to access on my-chart.   Marta Antu, LPN   12/15/9560  Nurse Health Advisor  Nurse Notes: None

## 2020-09-16 ENCOUNTER — Other Ambulatory Visit: Payer: Medicare Other

## 2020-09-16 ENCOUNTER — Other Ambulatory Visit: Payer: Self-pay | Admitting: Nurse Practitioner

## 2020-09-16 ENCOUNTER — Ambulatory Visit
Admission: RE | Admit: 2020-09-16 | Discharge: 2020-09-16 | Disposition: A | Discharge status: Home / Self Care | Payer: Medicare Other | Source: Ambulatory Visit | Attending: Nurse Practitioner | Admitting: Nurse Practitioner

## 2020-09-16 ENCOUNTER — Inpatient Hospital Stay: Admission: RE | Admit: 2020-09-16 | Payer: Medicare Other | Source: Ambulatory Visit

## 2020-09-16 DIAGNOSIS — K760 Fatty (change of) liver, not elsewhere classified: Secondary | ICD-10-CM | POA: Diagnosis not present

## 2020-09-17 ENCOUNTER — Encounter (HOSPITAL_BASED_OUTPATIENT_CLINIC_OR_DEPARTMENT_OTHER): Payer: Medicare Other | Admitting: Internal Medicine

## 2020-09-18 ENCOUNTER — Other Ambulatory Visit: Payer: Self-pay

## 2020-09-18 ENCOUNTER — Ambulatory Visit: Payer: Medicare Other

## 2020-09-18 DIAGNOSIS — E871 Hypo-osmolality and hyponatremia: Secondary | ICD-10-CM

## 2020-09-18 LAB — BASIC METABOLIC PANEL
BUN: 5 mg/dL — ABNORMAL LOW (ref 6–23)
CO2: 26 mEq/L (ref 19–32)
Calcium: 9.2 mg/dL (ref 8.4–10.5)
Chloride: 94 mEq/L — ABNORMAL LOW (ref 96–112)
Creatinine, Ser: 0.52 mg/dL (ref 0.40–1.20)
GFR: 108.05 mL/min (ref >60.00)
Glucose, Bld: 81 mg/dL (ref 70–99)
Potassium: 4 mEq/L (ref 3.5–5.1)
Sodium: 129 mEq/L — ABNORMAL LOW (ref 135–145)

## 2020-09-21 ENCOUNTER — Encounter: Payer: Self-pay | Admitting: Family Medicine

## 2020-09-22 ENCOUNTER — Encounter (HOSPITAL_BASED_OUTPATIENT_CLINIC_OR_DEPARTMENT_OTHER): Payer: Medicare Other | Attending: Nurse Practitioner | Admitting: Internal Medicine

## 2020-09-22 ENCOUNTER — Other Ambulatory Visit: Payer: Self-pay

## 2020-09-22 DIAGNOSIS — I872 Venous insufficiency (chronic) (peripheral): Secondary | ICD-10-CM | POA: Diagnosis not present

## 2020-09-22 DIAGNOSIS — L97811 Non-pressure chronic ulcer of other part of right lower leg limited to breakdown of skin: Secondary | ICD-10-CM | POA: Diagnosis not present

## 2020-09-22 DIAGNOSIS — M797 Fibromyalgia: Secondary | ICD-10-CM | POA: Insufficient documentation

## 2020-09-22 DIAGNOSIS — I87333 Chronic venous hypertension (idiopathic) with ulcer and inflammation of bilateral lower extremity: Secondary | ICD-10-CM | POA: Insufficient documentation

## 2020-09-22 DIAGNOSIS — I890 Lymphedema, not elsewhere classified: Secondary | ICD-10-CM | POA: Insufficient documentation

## 2020-09-22 DIAGNOSIS — M170 Bilateral primary osteoarthritis of knee: Secondary | ICD-10-CM | POA: Diagnosis not present

## 2020-09-22 DIAGNOSIS — L97821 Non-pressure chronic ulcer of other part of left lower leg limited to breakdown of skin: Secondary | ICD-10-CM | POA: Diagnosis not present

## 2020-09-22 DIAGNOSIS — Z8249 Family history of ischemic heart disease and other diseases of the circulatory system: Secondary | ICD-10-CM | POA: Insufficient documentation

## 2020-09-22 DIAGNOSIS — K760 Fatty (change of) liver, not elsewhere classified: Secondary | ICD-10-CM

## 2020-09-22 DIAGNOSIS — M300 Polyarteritis nodosa: Secondary | ICD-10-CM | POA: Insufficient documentation

## 2020-09-22 DIAGNOSIS — Z833 Family history of diabetes mellitus: Secondary | ICD-10-CM | POA: Diagnosis not present

## 2020-09-22 DIAGNOSIS — I10 Essential (primary) hypertension: Secondary | ICD-10-CM | POA: Diagnosis not present

## 2020-09-22 NOTE — Progress Notes (Signed)
VITTORIA, NOREEN ([id number]) Visit Report for 09/22/2020 Chief Complaint Document Details Patient Name: Date of Service: [name], [name] 09/22/2020 12:45 PM Medical Record Number: [id number] Patient Account Number: [id number] Date of Birth/Sex: Treating RN: 1971/02/16 (50 y.o. [name] Primary Care Provider: [name] Other Clinician: Referring Provider: Treating Provider/Extender: Kalman Shan DRA Adair Patter, DA WN Weeks in Treatment: 14 Information Obtained from: Patient Chief Complaint Bilateral lower extremity wounds, left greater than right Electronic Signature(s) Signed: 09/22/2020 2:08:18 PM By: Kalman Shan DO Entered By: Kalman Shan on 09/22/2020 14:01:05 -------------------------------------------------------------------------------- HPI Details Patient Name: Date of Service: [name]. 09/22/2020 12:45 PM Medical Record Number: [id number] Patient Account Number: [id number] Date of Birth/Sex: Treating RN: [birth year] (50 y.o. [name] Primary Care Provider: [name] Other Clinician: Referring Provider: Treating Provider/Extender: Kalman Shan DRA Adair Patter, DA WN Weeks in Treatment: 14 History of Present Illness HPI Description: [name] is a 50 year old female with a past medical history of psoriasis, hyperlipidemia, chronic lower extremity edema as described, cutaneous polyarteritis nodosa, sleep apnea, fibromyalgia, and osteoarthritis of both knees. She was recently followed in our clinic in February for her lower extremity wounds due to chronic venous insufficiency and lymphedema. She was treated with compression wraps and Silver alginate And occasionally with Hydrofera Blue. These closed after a couple months of treatment. During that time she received juxta lite compressions. She was ordered lymphedema pumps to but these had not arrived prior to her wound closure. Today she presents with reopening of her lower extremity wounds. She  has still not received her lymphedema pumps. She states she has been using juxta lite compressions daily. She denies acute signs of infection. Electronic Signature(s) Signed: 09/22/2020 2:08:18 PM By: Kalman Shan DO Entered By: Kalman Shan on 09/22/2020 14:03:03 -------------------------------------------------------------------------------- Physical Exam Details Patient Name: Date of Service: [name] 09/22/2020 12:45 PM Medical Record Number: [id number] Patient Account Number: [id number] Date of Birth/Sex: Treating RN: [birth year] (50 y.o. [name] Primary Care Provider: [name] Other Clinician: Referring Provider: Treating Provider/Extender: Kalman Shan DRA ZEK, DA WN Weeks in Treatment: 14 Constitutional respirations regular, non-labored and within target range for patient.. Cardiovascular 2+ dorsalis pedis/posterior tibialis pulses. Psychiatric pleasant and cooperative. Notes Left lower extremity has circumferential wounds limited to skin breakdown without signs of infection. Right lower extremity has an anterior and posterior wound limited to skin breakdown. No acute signs of infection. 2+ pitting edema bilaterally. There is weeping to the lower extremities L > R Electronic Signature(s) Signed: 09/22/2020 2:08:18 PM By: Kalman Shan DO Entered By: Kalman Shan on 09/22/2020 14:03:24 -------------------------------------------------------------------------------- Physician Orders Details Patient Name: Date of Service: [name]. 09/22/2020 12:45 PM Medical Record Number: [id number] Patient Account Number: [id number] Date of Birth/Sex: Treating RN: 06/26/1970 (50 y.o. [name], Meta.Reding Primary Care Provider: [name] Other Clinician: Referring Provider: Treating Provider/Extender: Kalman Shan DRA Adair Patter, DA WN Weeks in Treatment: 70 Verbal / Phone Orders: No Diagnosis Coding ICD-10 Coding Code Description [phone]  Chronic venous hypertension (idiopathic) with ulcer and inflammation of bilateral lower extremity L97.821 Non-pressure chronic ulcer of other part of left lower leg limited to breakdown of skin L97.811 Non-pressure chronic ulcer of other part of right lower leg limited to breakdown of skin I89.0 Lymphedema, not elsewhere classified M30.0 Polyarteritis nodosa Follow-up Appointments Return Appointment in 1 week. Bathing/ Shower/ Hygiene May shower with protection but do not get wound dressing(s) wet. - May use cast protectors...you can find those at [facility] or [facility]  Edema Control - Lymphedema / SCD / Other Lymphedema Pumps. Use Lymphedema pumps on leg(s) 2-3 times a day for 45-60 minutes. If wearing any wraps or hose, do not remove them. Continue exercising as instructed. - Keep phone line open for [employer] to schedule an appointment to set up pumps at home. Elevate legs to the level of the heart or above for 30 minutes daily and/or when sitting, a frequency of: Avoid standing for long periods of time. Exercise regularly Wound Treatment Wound #10 - Lower Leg Wound Laterality: Left, Circumferential Cleanser: Soap and Water 1 x Per Week/30 Days Discharge Instructions: May shower and wash wound with dial antibacterial soap and water prior to dressing change. Cleanser: Wound Cleanser 1 x Per Week/30 Days Discharge Instructions: Cleanse the wound with wound cleanser prior to applying a clean dressing using gauze sponges, not tissue or cotton balls. Peri-Wound Care: Triamcinolone 15 (g) 1 x Per Week/30 Days Discharge Instructions: Use triamcinolone 15 (g) as directed Peri-Wound Care: Zinc Oxide Ointment 30g tube 1 x Per Week/30 Days Discharge Instructions: Apply Zinc Oxide to periwound with each dressing change Peri-Wound Care: Sween Lotion (Moisturizing lotion) 1 x Per Week/30 Days Discharge Instructions: Apply moisturizing lotion as directed Prim Dressing: KerraCel Ag Gelling Fiber Dressing, 4x5  in (silver alginate) 1 x Per Week/30 Days ary Discharge Instructions: Apply silver alginate to wound bed as instructed Secondary Dressing: Woven Gauze Sponge, Non-Sterile 4x4 in 1 x Per Week/30 Days Discharge Instructions: Apply over primary dressing as directed. Secondary Dressing: ABD Pad, 8x10 1 x Per Week/30 Days Discharge Instructions: Apply over primary dressing as directed. Secondary Dressing: Zetuvit Plus 4x8 in 1 x Per Week/30 Days Discharge Instructions: Apply over primary dressing as directed. Compression Wrap: CoFlex TLC XL 2-layer Compression System 4x7 (in/yd) 1 x Per Week/30 Days Discharge Instructions: ***May apply first layer of unna boot under the compression wrap.***Apply CoFlex 2-layer compression as directed. (alt for 4 layer) Wound #11 - Lower Leg Wound Laterality: Right, Posterior Cleanser: Soap and Water 1 x Per Week/30 Days Discharge Instructions: May shower and wash wound with dial antibacterial soap and water prior to dressing change. Cleanser: Wound Cleanser 1 x Per Week/30 Days Discharge Instructions: Cleanse the wound with wound cleanser prior to applying a clean dressing using gauze sponges, not tissue or cotton balls. Peri-Wound Care: Triamcinolone 15 (g) 1 x Per Week/30 Days Discharge Instructions: Use triamcinolone 15 (g) as directed Peri-Wound Care: Zinc Oxide Ointment 30g tube 1 x Per Week/30 Days Discharge Instructions: Apply Zinc Oxide to periwound with each dressing change Peri-Wound Care: Sween Lotion (Moisturizing lotion) 1 x Per Week/30 Days Discharge Instructions: Apply moisturizing lotion as directed Prim Dressing: KerraCel Ag Gelling Fiber Dressing, 4x5 in (silver alginate) 1 x Per Week/30 Days ary Discharge Instructions: Apply silver alginate to wound bed as instructed Secondary Dressing: Woven Gauze Sponge, Non-Sterile 4x4 in 1 x Per Week/30 Days Discharge Instructions: Apply over primary dressing as directed. Secondary Dressing: ABD Pad,  8x10 1 x Per Week/30 Days Discharge Instructions: Apply over primary dressing as directed. Secondary Dressing: Zetuvit Plus 4x8 in 1 x Per Week/30 Days Discharge Instructions: Apply over primary dressing as directed. Compression Wrap: CoFlex TLC XL 2-layer Compression System 4x7 (in/yd) 1 x Per Week/30 Days Discharge Instructions: ***May apply first layer of unna boot under the compression wrap.***Apply CoFlex 2-layer compression as directed. (alt for 4 layer) Wound #12 - Lower Leg Wound Laterality: Right, Anterior, Proximal Cleanser: Soap and Water 1 x Per Week/30 Days Discharge Instructions: May  shower and wash wound with dial antibacterial soap and water prior to dressing change. Cleanser: Wound Cleanser 1 x Per Week/30 Days Discharge Instructions: Cleanse the wound with wound cleanser prior to applying a clean dressing using gauze sponges, not tissue or cotton balls. Peri-Wound Care: Triamcinolone 15 (g) 1 x Per Week/30 Days Discharge Instructions: Use triamcinolone 15 (g) as directed Peri-Wound Care: Zinc Oxide Ointment 30g tube 1 x Per Week/30 Days Discharge Instructions: Apply Zinc Oxide to periwound with each dressing change Peri-Wound Care: Sween Lotion (Moisturizing lotion) 1 x Per Week/30 Days Discharge Instructions: Apply moisturizing lotion as directed Prim Dressing: KerraCel Ag Gelling Fiber Dressing, 4x5 in (silver alginate) 1 x Per Week/30 Days ary Discharge Instructions: Apply silver alginate to wound bed as instructed Secondary Dressing: Woven Gauze Sponge, Non-Sterile 4x4 in 1 x Per Week/30 Days Discharge Instructions: Apply over primary dressing as directed. Secondary Dressing: ABD Pad, 8x10 1 x Per Week/30 Days Discharge Instructions: Apply over primary dressing as directed. Secondary Dressing: Zetuvit Plus 4x8 in 1 x Per Week/30 Days Discharge Instructions: Apply over primary dressing as directed. Compression Wrap: CoFlex TLC XL 2-layer Compression System 4x7 (in/yd)  1 x Per Week/30 Days Discharge Instructions: ***May apply first layer of unna boot under the compression wrap.***Apply CoFlex 2-layer compression as directed. (alt for 4 layer) Wound #13 - Lower Leg Wound Laterality: Right, Anterior Cleanser: Soap and Water 1 x Per Week/30 Days Discharge Instructions: May shower and wash wound with dial antibacterial soap and water prior to dressing change. Cleanser: Wound Cleanser 1 x Per Week/30 Days Discharge Instructions: Cleanse the wound with wound cleanser prior to applying a clean dressing using gauze sponges, not tissue or cotton balls. Peri-Wound Care: Triamcinolone 15 (g) 1 x Per Week/30 Days Discharge Instructions: Use triamcinolone 15 (g) as directed Peri-Wound Care: Zinc Oxide Ointment 30g tube 1 x Per Week/30 Days Discharge Instructions: Apply Zinc Oxide to periwound with each dressing change Peri-Wound Care: Sween Lotion (Moisturizing lotion) 1 x Per Week/30 Days Discharge Instructions: Apply moisturizing lotion as directed Prim Dressing: KerraCel Ag Gelling Fiber Dressing, 4x5 in (silver alginate) 1 x Per Week/30 Days ary Discharge Instructions: Apply silver alginate to wound bed as instructed Secondary Dressing: Woven Gauze Sponge, Non-Sterile 4x4 in 1 x Per Week/30 Days Discharge Instructions: Apply over primary dressing as directed. Secondary Dressing: ABD Pad, 8x10 1 x Per Week/30 Days Discharge Instructions: Apply over primary dressing as directed. Secondary Dressing: Zetuvit Plus 4x8 in 1 x Per Week/30 Days Discharge Instructions: Apply over primary dressing as directed. Compression Wrap: CoFlex TLC XL 2-layer Compression System 4x7 (in/yd) 1 x Per Week/30 Days Discharge Instructions: ***May apply first layer of unna boot under the compression wrap.***Apply CoFlex 2-layer compression as directed. (alt for 4 layer) Electronic Signature(s) Signed: 09/22/2020 2:08:18 PM By: Geralyn Corwin DO Entered By: Geralyn Corwin on 09/22/2020  14:03:57 -------------------------------------------------------------------------------- Problem List Details Patient Name: Date of Service: [name]. 09/22/2020 12:45 PM Medical Record Number: [id number] Patient Account Number: [id number] Date of Birth/Sex: Treating RN: 07-26-1970 (49 y.o. [name], [name] Primary Care Provider: Neena Rhymes Other Clinician: Referring Provider: Treating Provider/Extender: Geralyn Corwin DRA Lillia Abed, DA WN Weeks in Treatment: 2 Active Problems ICD-10 Encounter Code Description Active Date MDM Diagnosis I87.333 Chronic venous hypertension (idiopathic) with ulcer and inflammation of 06/16/2020 No Yes bilateral lower extremity L97.821 Non-pressure chronic ulcer of other part of left lower leg limited to breakdown 06/16/2020 No Yes of skin L97.811 Non-pressure chronic ulcer of other part of right  lower leg limited to breakdown 06/16/2020 No Yes of skin I89.0 Lymphedema, not elsewhere classified 06/16/2020 No Yes M30.0 Polyarteritis nodosa 06/16/2020 No Yes Inactive Problems Resolved Problems ICD-10 Code Description Active Date Resolved Date L03.116 Cellulitis of left lower limb 07/07/2020 07/07/2020 Electronic Signature(s) Signed: 09/22/2020 2:08:18 PM By: Kalman Shan DO Entered By: Kalman Shan on 09/22/2020 14:00:36 -------------------------------------------------------------------------------- Progress Note Details Patient Name: Date of Service: [name]. 09/22/2020 12:45 PM Medical Record Number: [id number] Patient Account Number: [id number] Date of Birth/Sex: Treating RN: 1970/07/21 (50 y.o. [name] Primary Care Provider: [name] Other Clinician: Referring Provider: Treating Provider/Extender: Kalman Shan DRA ZEK, DA WN Weeks in Treatment: 14 Subjective Chief Complaint Information obtained from Patient Bilateral lower extremity wounds, left greater than right History of Present Illness (HPI) Ms.  Pytel is a 50 year old female with a past medical history of psoriasis, hyperlipidemia, chronic lower extremity edema as described, cutaneous polyarteritis nodosa, sleep apnea, fibromyalgia, and osteoarthritis of both knees. She was recently followed in our clinic in February for her lower extremity wounds due to chronic venous insufficiency and lymphedema. She was treated with compression wraps and Silver alginate And occasionally with Hydrofera Blue. These closed after a couple months of treatment. During that time she received juxta lite compressions. She was ordered lymphedema pumps to but these had not arrived prior to her wound closure. Today she presents with reopening of her lower extremity wounds. She has still not received her lymphedema pumps. She states she has been using juxta lite compressions daily. She denies acute signs of infection. Patient History Information obtained from Patient. Family History Cancer - Father,Maternal Grandparents, Diabetes - Mother, Heart Disease - Father, Hypertension - Mother, Lung Disease - Mother, No family history of Hereditary Spherocytosis. Social History Never smoker, Marital Status - Single, Alcohol Use - Never, Drug Use - No History, Caffeine Use - Rarely. Medical History Hematologic/Lymphatic Patient has history of Lymphedema - lower legs Respiratory Patient has history of Sleep Apnea Cardiovascular Patient has history of Hypertension, Peripheral Venous Disease Musculoskeletal Patient has history of Osteoarthritis Psychiatric Patient has history of Confinement Anxiety Medical A Surgical History Notes nd Cardiovascular Hyperlipidemia Musculoskeletal polyarthritis nodosa, fibromyalgia Objective Constitutional respirations regular, non-labored and within target range for patient.. Vitals Time Taken: 1:18 PM, Height: 62 in, Weight: 304 lbs, BMI: 55.6, Temperature: 97.5 F, Pulse: 83 bpm, Respiratory Rate: 18 breaths/min, Blood  Pressure: 135/82 mmHg. Cardiovascular 2+ dorsalis pedis/posterior tibialis pulses. Psychiatric pleasant and cooperative. General Notes: Left lower extremity has circumferential wounds limited to skin breakdown without signs of infection. Right lower extremity has an anterior and posterior wound limited to skin breakdown. No acute signs of infection. 2+ pitting edema bilaterally. There is weeping to the lower extremities L > R Integumentary (Hair, Skin) Wound #10 status is Open. Original cause of wound was Gradually Appeared. The date acquired was: 09/08/2020. The wound is located on the Left,Circumferential Lower Leg. The wound measures 10cm length x 33.5cm width x 0.1cm depth; 263.108cm^2 area and 26.311cm^3 volume. There is Fat Layer (Subcutaneous Tissue) exposed. There is no tunneling or undermining noted. There is a large amount of serosanguineous drainage noted. The wound margin is distinct with the outline attached to the wound base. There is large (67-100%) red, pink granulation within the wound bed. There is a small (1-33%) amount of necrotic tissue within the wound bed including Adherent Slough. Wound #11 status is Open. Original cause of wound was Gradually Appeared. The date acquired was: 09/08/2020. The wound is located  on the Right,Posterior Lower Leg. The wound measures 3.5cm length x 2cm width x 0.1cm depth; 5.498cm^2 area and 0.55cm^3 volume. There is Fat Layer (Subcutaneous Tissue) exposed. There is no tunneling or undermining noted. There is a large amount of serosanguineous drainage noted. There is large (67-100%) red granulation within the wound bed. There is no necrotic tissue within the wound bed. Wound #12 status is Open. Original cause of wound was Gradually Appeared. The date acquired was: 09/08/2020. The wound is located on the Right,Proximal,Anterior Lower Leg. The wound measures 1cm length x 1cm width x 0.1cm depth; 0.785cm^2 area and 0.079cm^3 volume. There is Fat  Layer (Subcutaneous Tissue) exposed. There is no tunneling or undermining noted. There is a medium amount of serosanguineous drainage noted. The wound margin is distinct with the outline attached to the wound base. There is large (67-100%) red granulation within the wound bed. There is no necrotic tissue within the wound bed. Wound #13 status is Open. Original cause of wound was Gradually Appeared. The date acquired was: 09/08/2020. The wound is located on the Right,Anterior Lower Leg. The wound measures 1.6cm length x 1.1cm width x 0.1cm depth; 1.382cm^2 area and 0.138cm^3 volume. There is Fat Layer (Subcutaneous Tissue) exposed. There is no tunneling or undermining noted. There is a medium amount of serosanguineous drainage noted. The wound margin is distinct with the outline attached to the wound base. There is large (67-100%) red granulation within the wound bed. There is no necrotic tissue within the wound bed. Assessment Active Problems ICD-10 Chronic venous hypertension (idiopathic) with ulcer and inflammation of bilateral lower extremity Non-pressure chronic ulcer of other part of left lower leg limited to breakdown of skin Non-pressure chronic ulcer of other part of right lower leg limited to breakdown of skin Lymphedema, not elsewhere classified Polyarteritis nodosa Patient was discharged from our clinic a month and a half ago for bilateral lower extremity wounds due to venous insufficiency and lymphedema. She presents today with skin breakdown to the lower extremities bilaterally. She states she has been using her juxta light compressions daily however I am not sure if she was using these correctly. She has a hard time putting these on. We will also reach out to the company for the lymphedema pumps to see why Patient does not have them. We will go ahead and wrap her legs with 4-layer compression and silver alginate underneath. She is to call with any questions or concerns. I will see  her in follow-up in 1 week Procedures Wound #10 Pre-procedure diagnosis of Wound #10 is a Lymphedema located on the Left,Circumferential Lower Leg . There was a Four Layer Compression Therapy Procedure by Erica Iba, RN. Post procedure Diagnosis Wound #10: Same as Pre-Procedure Wound #11 Pre-procedure diagnosis of Wound #11 is a Lymphedema located on the Right,Posterior Lower Leg . There was a Four Layer Compression Therapy Procedure by Erica Iba, RN. Post procedure Diagnosis Wound #11: Same as Pre-Procedure Wound #12 Pre-procedure diagnosis of Wound #12 is a Lymphedema located on the Right,Proximal,Anterior Lower Leg . There was a Four Layer Compression Therapy Procedure by Erica Iba, RN. Post procedure Diagnosis Wound #12: Same as Pre-Procedure Wound #13 Pre-procedure diagnosis of Wound #13 is a Lymphedema located on the Right,Anterior Lower Leg . There was a Four Layer Compression Therapy Procedure by Erica Iba, RN. Post procedure Diagnosis Wound #13: Same as Pre-Procedure Plan Follow-up Appointments: Return Appointment in 1 week. Bathing/ Shower/ Hygiene: May shower with protection but do not get wound dressing(s) wet. -  May use cast protectors...you can find those at [facility] or [facility] Edema Control - Lymphedema / SCD / Other: Lymphedema Pumps. Use Lymphedema pumps on leg(s) 2-3 times a day for 45-60 minutes. If wearing any wraps or hose, do not remove them. Continue exercising as instructed. - Keep phone line open for [employer] to schedule an appointment to set up pumps at home. Elevate legs to the level of the heart or above for 30 minutes daily and/or when sitting, a frequency of: Avoid standing for long periods of time. Exercise regularly WOUND #10: - Lower Leg Wound Laterality: Left, Circumferential Cleanser: Soap and Water 1 x Per Week/30 Days Discharge Instructions: May shower and wash wound with dial antibacterial soap and water prior to dressing  change. Cleanser: Wound Cleanser 1 x Per Week/30 Days Discharge Instructions: Cleanse the wound with wound cleanser prior to applying a clean dressing using gauze sponges, not tissue or cotton balls. Peri-Wound Care: Triamcinolone 15 (g) 1 x Per Week/30 Days Discharge Instructions: Use triamcinolone 15 (g) as directed Peri-Wound Care: Zinc Oxide Ointment 30g tube 1 x Per Week/30 Days Discharge Instructions: Apply Zinc Oxide to periwound with each dressing change Peri-Wound Care: Sween Lotion (Moisturizing lotion) 1 x Per Week/30 Days Discharge Instructions: Apply moisturizing lotion as directed Prim Dressing: KerraCel Ag Gelling Fiber Dressing, 4x5 in (silver alginate) 1 x Per Week/30 Days ary Discharge Instructions: Apply silver alginate to wound bed as instructed Secondary Dressing: Woven Gauze Sponge, Non-Sterile 4x4 in 1 x Per Week/30 Days Discharge Instructions: Apply over primary dressing as directed. Secondary Dressing: ABD Pad, 8x10 1 x Per Week/30 Days Discharge Instructions: Apply over primary dressing as directed. Secondary Dressing: Zetuvit Plus 4x8 in 1 x Per Week/30 Days Discharge Instructions: Apply over primary dressing as directed. Com pression Wrap: CoFlex TLC XL 2-layer Compression System 4x7 (in/yd) 1 x Per Week/30 Days Discharge Instructions: ***May apply first layer of unna boot under the compression wrap.***Apply CoFlex 2-layer compression as directed. (alt for 4 layer) WOUND #11: - Lower Leg Wound Laterality: Right, Posterior Cleanser: Soap and Water 1 x Per Week/30 Days Discharge Instructions: May shower and wash wound with dial antibacterial soap and water prior to dressing change. Cleanser: Wound Cleanser 1 x Per Week/30 Days Discharge Instructions: Cleanse the wound with wound cleanser prior to applying a clean dressing using gauze sponges, not tissue or cotton balls. Peri-Wound Care: Triamcinolone 15 (g) 1 x Per Week/30 Days Discharge Instructions: Use  triamcinolone 15 (g) as directed Peri-Wound Care: Zinc Oxide Ointment 30g tube 1 x Per Week/30 Days Discharge Instructions: Apply Zinc Oxide to periwound with each dressing change Peri-Wound Care: Sween Lotion (Moisturizing lotion) 1 x Per Week/30 Days Discharge Instructions: Apply moisturizing lotion as directed Prim Dressing: KerraCel Ag Gelling Fiber Dressing, 4x5 in (silver alginate) 1 x Per Week/30 Days ary Discharge Instructions: Apply silver alginate to wound bed as instructed Secondary Dressing: Woven Gauze Sponge, Non-Sterile 4x4 in 1 x Per Week/30 Days Discharge Instructions: Apply over primary dressing as directed. Secondary Dressing: ABD Pad, 8x10 1 x Per Week/30 Days Discharge Instructions: Apply over primary dressing as directed. Secondary Dressing: Zetuvit Plus 4x8 in 1 x Per Week/30 Days Discharge Instructions: Apply over primary dressing as directed. Com pression Wrap: CoFlex TLC XL 2-layer Compression System 4x7 (in/yd) 1 x Per Week/30 Days Discharge Instructions: ***May apply first layer of unna boot under the compression wrap.***Apply CoFlex 2-layer compression as directed. (alt for 4 layer) WOUND #12: - Lower Leg Wound Laterality: Right, Anterior, Proximal Cleanser:  Soap and Water 1 x Per Week/30 Days Discharge Instructions: May shower and wash wound with dial antibacterial soap and water prior to dressing change. Cleanser: Wound Cleanser 1 x Per Week/30 Days Discharge Instructions: Cleanse the wound with wound cleanser prior to applying a clean dressing using gauze sponges, not tissue or cotton balls. Peri-Wound Care: Triamcinolone 15 (g) 1 x Per Week/30 Days Discharge Instructions: Use triamcinolone 15 (g) as directed Peri-Wound Care: Zinc Oxide Ointment 30g tube 1 x Per Week/30 Days Discharge Instructions: Apply Zinc Oxide to periwound with each dressing change Peri-Wound Care: Sween Lotion (Moisturizing lotion) 1 x Per Week/30 Days Discharge Instructions: Apply  moisturizing lotion as directed Prim Dressing: KerraCel Ag Gelling Fiber Dressing, 4x5 in (silver alginate) 1 x Per Week/30 Days ary Discharge Instructions: Apply silver alginate to wound bed as instructed Secondary Dressing: Woven Gauze Sponge, Non-Sterile 4x4 in 1 x Per Week/30 Days Discharge Instructions: Apply over primary dressing as directed. Secondary Dressing: ABD Pad, 8x10 1 x Per Week/30 Days Discharge Instructions: Apply over primary dressing as directed. Secondary Dressing: Zetuvit Plus 4x8 in 1 x Per Week/30 Days Discharge Instructions: Apply over primary dressing as directed. Com pression Wrap: CoFlex TLC XL 2-layer Compression System 4x7 (in/yd) 1 x Per Week/30 Days Discharge Instructions: ***May apply first layer of unna boot under the compression wrap.***Apply CoFlex 2-layer compression as directed. (alt for 4 layer) WOUND #13: - Lower Leg Wound Laterality: Right, Anterior Cleanser: Soap and Water 1 x Per Week/30 Days Discharge Instructions: May shower and wash wound with dial antibacterial soap and water prior to dressing change. Cleanser: Wound Cleanser 1 x Per Week/30 Days Discharge Instructions: Cleanse the wound with wound cleanser prior to applying a clean dressing using gauze sponges, not tissue or cotton balls. Peri-Wound Care: Triamcinolone 15 (g) 1 x Per Week/30 Days Discharge Instructions: Use triamcinolone 15 (g) as directed Peri-Wound Care: Zinc Oxide Ointment 30g tube 1 x Per Week/30 Days Discharge Instructions: Apply Zinc Oxide to periwound with each dressing change Peri-Wound Care: Sween Lotion (Moisturizing lotion) 1 x Per Week/30 Days Discharge Instructions: Apply moisturizing lotion as directed Prim Dressing: KerraCel Ag Gelling Fiber Dressing, 4x5 in (silver alginate) 1 x Per Week/30 Days ary Discharge Instructions: Apply silver alginate to wound bed as instructed Secondary Dressing: Woven Gauze Sponge, Non-Sterile 4x4 in 1 x Per Week/30 Days Discharge  Instructions: Apply over primary dressing as directed. Secondary Dressing: ABD Pad, 8x10 1 x Per Week/30 Days Discharge Instructions: Apply over primary dressing as directed. Secondary Dressing: Zetuvit Plus 4x8 in 1 x Per Week/30 Days Discharge Instructions: Apply over primary dressing as directed. Com pression Wrap: CoFlex TLC XL 2-layer Compression System 4x7 (in/yd) 1 x Per Week/30 Days Discharge Instructions: ***May apply first layer of unna boot under the compression wrap.***Apply CoFlex 2-layer compression as directed. (alt for 4 layer) 1. Silver alginate under 4-layer compression 2. Follow-up in 1 week Electronic Signature(s) Signed: 09/22/2020 2:08:18 PM By: Kalman Shan DO Entered By: Kalman Shan on 09/22/2020 14:07:25 -------------------------------------------------------------------------------- HxROS Details Patient Name: Date of Service: [name]. 09/22/2020 12:45 PM Medical Record Number: [id number] Patient Account Number: [id number] Date of Birth/Sex: Treating RN: 07-04-1970 (50 y.o. [name] Primary Care Provider: [name] Other Clinician: Referring Provider: Treating Provider/Extender: Kalman Shan DRA Adair Patter, DA WN Weeks in Treatment: 14 Information Obtained From Patient Hematologic/Lymphatic Medical History: Positive for: Lymphedema - lower legs Respiratory Medical History: Positive for: Sleep Apnea Cardiovascular Medical History: Positive for: Hypertension; Peripheral Venous Disease Past Medical  History Notes: Hyperlipidemia Musculoskeletal Medical History: Positive for: Osteoarthritis Past Medical History Notes: polyarthritis nodosa, fibromyalgia Psychiatric Medical History: Positive for: Confinement Anxiety Immunizations Pneumococcal Vaccine: Received Pneumococcal Vaccination: Yes Implantable Devices None Family and Social History Cancer: Yes - Father,Maternal Grandparents; Diabetes: Yes - Mother; Heart Disease:  Yes - Father; Hereditary Spherocytosis: No; Hypertension: Yes - Mother; Lung Disease: Yes - Mother; Never smoker; Marital Status - Single; Alcohol Use: Never; Drug Use: No History; Caffeine Use: Rarely; Financial Concerns: No; Food, Clothing or Shelter Needs: No; Support System Lacking: No; Transportation Concerns: No Electronic Signature(s) Signed: 09/22/2020 2:08:18 PM By: Geralyn Corwin DO Signed: 09/22/2020 6:10:24 PM By: Shawn Stall Entered By: Geralyn Corwin on 09/22/2020 14:03:10 -------------------------------------------------------------------------------- SuperBill Details Patient Name: Date of Service: [name]. 09/22/2020 Medical Record Number: [id number] Patient Account Number: [id number] Date of Birth/Sex: Treating RN: [birth year] (49 y.o. [name], Millard.Loa Primary Care Provider: Neena Rhymes Other Clinician: Referring Provider: Treating Provider/Extender: Geralyn Corwin DRA Lillia Abed, DA WN Weeks in Treatment: 14 Diagnosis Coding ICD-10 Codes Code Description [phone] Chronic venous hypertension (idiopathic) with ulcer and inflammation of bilateral lower extremity L97.821 Non-pressure chronic ulcer of other part of left lower leg limited to breakdown of skin L97.811 Non-pressure chronic ulcer of other part of right lower leg limited to breakdown of skin I89.0 Lymphedema, not elsewhere classified M30.0 Polyarteritis nodosa Facility Procedures Physician Procedures : CPT4 Code Description Modifier [id number] - WC PHYS LEVEL 3 - EST PT ICD-10 Diagnosis Description I87.333 Chronic venous hypertension (idiopathic) with ulcer and inflammation of bilateral lower extremity L97.821 Non-pressure chronic ulcer of other  part of left lower leg limited to breakdown of skin L97.811 Non-pressure chronic ulcer of other part of right lower leg limited to breakdown of skin I89.0 Lymphedema, not elsewhere classified Quantity: 1 Electronic Signature(s) Signed: 09/22/2020 2:08:18  PM By: Geralyn Corwin DO Entered By: Geralyn Corwin on 09/22/2020 14:07:46

## 2020-09-23 ENCOUNTER — Other Ambulatory Visit: Payer: Self-pay | Admitting: Family Medicine

## 2020-09-23 NOTE — Telephone Encounter (Signed)
LFD 07/09/20 #[id number] with no refills LOV 09/04/20 NOV 03/04/21

## 2020-09-23 NOTE — Progress Notes (Signed)
Erica, Macias ([id number]) Visit Report for 09/22/2020 Arrival Information Details Patient Name: Date of Service: [name] 09/22/2020 12:45 PM Medical Record Number: [id number] Patient Account Number: [id number] Date of Birth/Sex: Treating RN: [birth year] (50 y.o. [name], Meta.Reding Primary Care Taylynn Easton: [name] Other Clinician: Referring Brinley Rosete: Treating Myrian Botello/Extender: Kalman Shan DRA Adair Patter, DA WN Weeks in Treatment: 14 Visit Information History Since Last Visit Added or deleted any medications: No Patient Arrived: Cane Any new allergies or adverse reactions: No Arrival Time: 13:17 Had a fall or experienced change in No Accompanied By: self activities of daily living that may affect Transfer Assistance: None risk of falls: Patient Identification Verified: Yes Signs or symptoms of abuse/neglect since last visito No Secondary Verification Process Completed: Yes Hospitalized since last visit: No Patient Requires Transmission-Based Precautions: No Implantable device outside of the clinic excluding No Patient Has Alerts: No cellular tissue based products placed in the center since last visit: Has Dressing in Place as Prescribed: Yes Pain Present Now: No Electronic Signature(s) Signed: 09/23/2020 9:46:23 AM By: Sandre Kitty Entered By: Sandre Kitty on 09/22/2020 13:18:31 -------------------------------------------------------------------------------- Compression Therapy Details Patient Name: Date of Service: [name] 09/22/2020 12:45 PM Medical Record Number: [id number] Patient Account Number: [id number] Date of Birth/Sex: Treating RN: January 23, 1971 (50 y.o. [name] Primary Care Sona Nations: [name] Other Clinician: Referring Santanna Whitford: Treating Laniah Grimm/Extender: Kalman Shan DRA Adair Patter, DA WN Weeks in Treatment: 14 Compression Therapy Performed for Wound Assessment: Wound #10 Left,Circumferential Lower Leg Performed By:  Clinician Lorrin Jackson, RN Compression Type: Four Layer Post Procedure Diagnosis Same as Pre-procedure Electronic Signature(s) Signed: 09/22/2020 6:10:24 PM By: Deon Pilling Entered By: Deon Pilling on 09/22/2020 13:53:06 -------------------------------------------------------------------------------- Compression Therapy Details Patient Name: Date of Service: Erica, Macias 09/22/2020 12:45 PM Medical Record Number: [id number] Patient Account Number: [id number] Date of Birth/Sex: Treating RN: Mar 18, 1971 (50 y.o. [name] Primary Care Rhonin Trott: [name] Other Clinician: Referring Aftin Lye: Treating Kasiya Burck/Extender: Kalman Shan DRA Adair Patter, DA WN Weeks in Treatment: 14 Compression Therapy Performed for Wound Assessment: Wound #12 Right,Proximal,Anterior Lower Leg Performed By: Clinician Lorrin Jackson, RN Compression Type: Four Layer Post Procedure Diagnosis Same as Pre-procedure Electronic Signature(s) Signed: 09/22/2020 6:10:24 PM By: Deon Pilling Entered By: Deon Pilling on 09/22/2020 13:53:06 -------------------------------------------------------------------------------- Compression Therapy Details Patient Name: Date of Service: Erica, Macias 09/22/2020 12:45 PM Medical Record Number: [id number] Patient Account Number: [id number] Date of Birth/Sex: Treating RN: [birth year] (50 y.o. [name] Primary Care Schawn Byas: [name] Other Clinician: Referring Jaleisa Brose: Treating Shaylinn Hladik/Extender: Kalman Shan DRA Adair Patter, DA WN Weeks in Treatment: 14 Compression Therapy Performed for Wound Assessment: Wound #11 Right,Posterior Lower Leg Performed By: Clinician Lorrin Jackson, RN Compression Type: Four Layer Post Procedure Diagnosis Same as Pre-procedure Electronic Signature(s) Signed: 09/22/2020 6:10:24 PM By: Deon Pilling Entered By: Deon Pilling on 09/22/2020  13:53:06 -------------------------------------------------------------------------------- Compression Therapy Details Patient Name: Date of Service: OMAIRA, Macias 09/22/2020 12:45 PM Medical Record Number: [id number] Patient Account Number: [id number] Date of Birth/Sex: Treating RN: Jul 08, 1970 (50 y.o. [name] Primary Care Maureena Dabbs: [name] Other Clinician: Referring Zanna Hawn: Treating Raymond Bhardwaj/Extender: Kalman Shan DRA Adair Patter, DA WN Weeks in Treatment: 14 Compression Therapy Performed for Wound Assessment: Wound #13 Right,Anterior Lower Leg Performed By: Clinician Lorrin Jackson, RN Compression Type: Four Layer Post Procedure Diagnosis Same as Pre-procedure Electronic Signature(s) Signed: 09/22/2020 6:10:24 PM By: Deon Pilling Signed: 09/22/2020 6:10:24 PM By: Deon Pilling Entered By: Deon Pilling on 09/22/2020 13:53:06 -------------------------------------------------------------------------------- Encounter Discharge Information Details  Patient Name: Date of Service: Erica, Macias 09/22/2020 12:45 PM Medical Record Number: [id number] Patient Account Number: [id number] Date of Birth/Sex: Treating RN: 1970-08-10 (50 y.o. [name] Primary Care Kimia Finan: [name] Other Clinician: Referring Glenisha Gundry: Treating Erskin Zinda/Extender: Kalman Shan DRA ZEK, DA WN Weeks in Treatment: 14 Encounter Discharge Information Items Discharge Condition: Stable Ambulatory Status: Cane Discharge Destination: Home Transportation: Private Auto Schedule Follow-up Appointment: Yes Clinical Summary of Care: Provided on 09/22/2020 Form Type Recipient Paper Patient Patient Electronic Signature(s) Signed: 09/22/2020 3:07:37 PM By: Lorrin Jackson Entered By: Lorrin Jackson on 09/22/2020 15:07:37 -------------------------------------------------------------------------------- Lower Extremity Assessment Details Patient Name: Date of Service: [name] 09/22/2020 12:45 PM Medical Record Number: [id number] Patient Account Number: [id number] Date of Birth/Sex: Treating RN: 08-15-1970 (50 y.o. [name] Primary Care Meggan Dhaliwal: [name] Other Clinician: Referring Chay Mazzoni: Treating Marielena Harvell/Extender: Kalman Shan DRA ZEK, DA WN Weeks in Treatment: 14 Edema Assessment Assessed: Shirlyn Goltz: Yes] Patrice Paradise: Yes] Edema: [Left: Yes] [Right: Yes] Calf Left: Right: Point of Measurement: 28 cm From Medial Instep 52 cm 50 cm Ankle Left: Right: Point of Measurement: 11 cm From Medial Instep 31 cm 28 cm Vascular Assessment Pulses: Dorsalis Pedis Palpable: [Left:Yes] [Right:Yes] Electronic Signature(s) Signed: 09/22/2020 6:10:24 PM By: Deon Pilling Entered By: Deon Pilling on 09/22/2020 13:44:44 -------------------------------------------------------------------------------- Multi Wound Chart Details Patient Name: Date of Service: [name]. 09/22/2020 12:45 PM Medical Record Number: [id number] Patient Account Number: [id number] Date of Birth/Sex: Treating RN: [birth year] (50 y.o. [name], Meta.Reding Primary Care Flavia Bruss: [name] Other Clinician: Referring Burman Bruington: Treating Dyami Umbach/Extender: Kalman Shan DRA ZEK, DA WN Weeks in Treatment: 14 Vital Signs Height(in): 62 Pulse(bpm): 83 Weight(lbs): 304 Blood Pressure(mmHg): 135/82 Body Mass Index(BMI): 56 Temperature(F): 97.5 Respiratory Rate(breaths/min): 18 Photos: [10:No Photos Left, Circumferential Lower Leg] [11:No Photos Right, Posterior Lower Leg] [12:No Photos Right, Proximal, Anterior Lower Leg] Wound Location: [10:Gradually Appeared] [11:Gradually Appeared] [12:Gradually Appeared] Wounding Event: [10:Lymphedema] [11:Lymphedema] [12:Lymphedema] Primary Etiology: [10:Lymphedema, Sleep Apnea,] [11:Lymphedema, Sleep Apnea,] [12:Lymphedema, Sleep Apnea,] Comorbid History: [10:Hypertension, Peripheral Venous Disease, Osteoarthritis, Confinement  Disease, Osteoarthritis, Confinement Disease, Osteoarthritis, Confinement Anxiety 09/08/2020] [11:Hypertension, Peripheral Venous Anxiety 09/08/2020]  [12:Hypertension, Peripheral Venous Anxiety 09/08/2020] Date Acquired: [10:0] [11:0] [12:0] Weeks of Treatment: [10:Open] [11:Open] [12:Open] Wound Status: [10:10x33.5x0.1] [11:3.5x2x0.1] [12:1x1x0.1] Measurements L x W x D (cm) [92:119.417] [11:5.498] [12:0.785] A (cm) : rea [40:81.448] [11:0.55] [12:0.079] Volume (cm) : [10:0.00%] [11:0.00%] [12:0.00%] % Reduction in Area: [10:0.00%] [11:0.00%] [12:0.00%] % Reduction in Volume: [10:Full Thickness Without Exposed] [11:Full Thickness Without Exposed] [12:Full Thickness Without Exposed] Classification: [10:Support Structures Large] [11:Support Structures Large] [12:Support Structures Medium] Exudate Amount: [10:Serosanguineous] [11:Serosanguineous] [12:Serosanguineous] Exudate Type: [10:red, brown] [11:red, brown] [12:red, brown] Exudate Color: [10:Distinct, outline attached] [11:N/A] [12:Distinct, outline attached] Wound Margin: [10:Large (67-100%)] [11:Large (67-100%)] [12:Large (67-100%)] Granulation Amount: [10:Red, Pink] [11:Red] [12:Red] Granulation Quality: [10:Small (1-33%)] [11:None Present (0%)] [12:None Present (0%)] Necrotic Amount: [10:Fat Layer (Subcutaneous Tissue): Yes Fat Layer (Subcutaneous Tissue): Yes Fat Layer (Subcutaneous Tissue): Yes] Exposed Structures: [10:Fascia: No Tendon: No Muscle: No Joint: No Bone: No None] [11:Fascia: No Tendon: No Muscle: No Joint: No Bone: No Small (1-33%)] [12:Fascia: No Tendon: No Muscle: No Joint: No Bone: No Small (1-33%)] Epithelialization: [10:Compression Therapy] [11:Compression Therapy] [12:Compression [place] Wound Number: 13 N/A N/A Photos: No Photos N/A N/A Right, Anterior Lower Leg N/A N/A Wound Location: Gradually Appeared N/A N/A Wounding Event: Lymphedema N/A N/A Primary Etiology: Lymphedema, Sleep Apnea, N/A N/A Comorbid  History: Hypertension, Peripheral Venous Disease, Osteoarthritis, Confinement Anxiety 09/08/2020 N/A N/A  Date Acquired: 0 N/A N/A Weeks of Treatment: Open N/A N/A Wound Status: 1.6x1.1x0.1 N/A N/A Measurements L x W x D (cm) 1.382 N/A N/A A (cm) : rea 0.138 N/A N/A Volume (cm) : 0.00% N/A N/A % Reduction in Area: 0.00% N/A N/A % Reduction in Volume: Full Thickness Without Exposed N/A N/A Classification: Support Structures Medium N/A N/A Exudate Amount: Serosanguineous N/A N/A Exudate Type: red, brown N/A N/A Exudate Color: Distinct, outline attached N/A N/A Wound Margin: Large (67-100%) N/A N/A Granulation Amount: Red N/A N/A Granulation Quality: None Present (0%) N/A N/A Necrotic Amount: Fat Layer (Subcutaneous Tissue): Yes N/A N/A Exposed Structures: Fascia: No Tendon: No Muscle: No Joint: No Bone: No Small (1-33%) N/A N/A Epithelialization: Compression Therapy N/A N/A Procedures Performed: Treatment Notes Electronic Signature(s) Signed: 09/22/2020 2:08:18 PM By: Kalman Shan DO Signed: 09/22/2020 6:10:24 PM By: Deon Pilling Entered By: Kalman Shan on 09/22/2020 14:00:42 -------------------------------------------------------------------------------- Multi-Disciplinary Care Plan Details Patient Name: Date of Service: [name]. 09/22/2020 12:45 PM Medical Record Number: [id number] Patient Account Number: [id number] Date of Birth/Sex: Treating RN: [birth year] (50 y.o. [name], Tammi Klippel Primary Care Kharon Hixon: [name] Other Clinician: Referring Pasqual Farias: Treating Shalina Norfolk/Extender: Kalman Shan DRA ZEK, DA WN Weeks in Treatment: 63 Active Inactive Abuse / Safety / Falls / Self Care Management Nursing Diagnoses: Potential for falls Goals: Patient will remain injury free related to falls Date Initiated: 09/22/2020 Target Resolution Date: 11/20/2020 Goal Status: Active Interventions: Assess fall risk on admission and as  needed Assess: immobility, friction, shearing, incontinence upon admission and as needed Assess self care needs on admission and as needed Provide education on fall prevention Notes: Venous Leg Ulcer Nursing Diagnoses: Potential for venous Insuffiency (use before diagnosis confirmed) Goals: Patient will maintain optimal edema control Date Initiated: 09/22/2020 Target Resolution Date: 10/16/2020 Goal Status: Active Interventions: Assess peripheral edema status every visit. Compression as ordered Provide education on venous insufficiency Notes: Electronic Signature(s) Signed: 09/22/2020 6:10:24 PM By: Deon Pilling Entered By: Deon Pilling on 09/22/2020 13:52:37 -------------------------------------------------------------------------------- Pain Assessment Details Patient Name: Date of Service: QUINNLYNN, PEEK 09/22/2020 12:45 PM Medical Record Number: [id number] Patient Account Number: [id number] Date of Birth/Sex: Treating RN: [birth year] (50 y.o. [name] Primary Care Barrie Sigmund: [name] Other Clinician: Referring Anvita Hirata: Treating Eagan Shifflett/Extender: Kalman Shan DRA ZEK, DA WN Weeks in Treatment: 14 Active Problems Location of Pain Severity and Description of Pain Patient Has Paino No Site Locations Pain Management and Medication Current Pain Management: Electronic Signature(s) Signed: 09/22/2020 6:10:24 PM By: Deon Pilling Signed: 09/23/2020 9:46:23 AM By: Sandre Kitty Entered By: Sandre Kitty on 09/22/2020 13:18:58 -------------------------------------------------------------------------------- Patient/Caregiver Education Details Patient Name: Date of Service: Erica Dennison Mascot 5/10/2022andnbsp12:45 PM Medical Record Number: [id number] Patient Account Number: [id number] Date of Birth/Gender: Treating RN: 1971/04/18 (50 y.o. [name] Primary Care Physician: [name] Other Clinician: Referring Physician: Treating  Physician/Extender: Kalman Shan DRA Adair Patter, DA WN Weeks in Treatment: 14 Education Assessment Education Provided To: Patient Education Topics Provided Venous: Handouts: Controlling Swelling with Multilayered Compression Wraps Methods: Explain/Verbal Responses: Reinforcements needed Electronic Signature(s) Signed: 09/22/2020 6:10:24 PM By: Deon Pilling Entered By: Deon Pilling on 09/22/2020 13:52:49 -------------------------------------------------------------------------------- Wound Assessment Details Patient Name: Date of Service: MARYPAT, MIEDEMA 09/22/2020 12:45 PM Medical Record Number: [id number] Patient Account Number: [id number] Date of Birth/Sex: Treating RN: 04-08-1971 (49 y.o. [name] Primary Care Kermit Arnette: [name] Other Clinician: Referring Foster Sonnier: Treating Duke Weisensel/Extender: Kalman Shan DRA ZEK, DA WN Weeks in Treatment: 14 Wound Status Wound  Number: 10 Primary Lymphedema Etiology: Wound Location: Left, Circumferential Lower Leg Wound Open Wounding Event: Gradually Appeared Status: Date Acquired: 09/08/2020 Comorbid Lymphedema, Sleep Apnea, Hypertension, Peripheral Venous Weeks Of Treatment: 0 History: Disease, Osteoarthritis, Confinement Anxiety Clustered Wound: No Photos Wound Measurements Length: (cm) 10 Width: (cm) 33.5 Depth: (cm) 0.1 Area: (cm) 263.108 Volume: (cm) 26.311 % Reduction in Area: 0% % Reduction in Volume: 0% Epithelialization: None Tunneling: No Undermining: No Wound Description Classification: Full Thickness Without Exposed Support Structures Wound Margin: Distinct, outline attached Exudate Amount: Large Exudate Type: Serosanguineous Exudate Color: red, brown Foul Odor After Cleansing: No Slough/Fibrino Yes Wound Bed Granulation Amount: Large (67-100%) Exposed Structure Granulation Quality: Red, Pink Fascia Exposed: No Necrotic Amount: Small (1-33%) Fat Layer (Subcutaneous Tissue) Exposed:  Yes Necrotic Quality: Adherent Slough Tendon Exposed: No Muscle Exposed: No Joint Exposed: No Bone Exposed: No Treatment Notes Wound #10 (Lower Leg) Wound Laterality: Left, Circumferential Cleanser Wound Cleanser Discharge Instruction: Cleanse the wound with wound cleanser prior to applying a clean dressing using gauze sponges, not tissue or cotton balls. Soap and Water Discharge Instruction: May shower and wash wound with dial antibacterial soap and water prior to dressing change. Peri-Wound Care Triamcinolone 15 (g) Discharge Instruction: Use triamcinolone 15 (g) as directed Sween Lotion (Moisturizing lotion) Discharge Instruction: Apply moisturizing lotion as directed Zinc Oxide Ointment 30g tube Discharge Instruction: Apply Zinc Oxide to periwound with each dressing change Topical Primary Dressing KerraCel Ag Gelling Fiber Dressing, 4x5 in (silver alginate) Discharge Instruction: Apply silver alginate to wound bed as instructed Secondary Dressing Woven Gauze Sponge, Non-Sterile 4x4 in Discharge Instruction: Apply over primary dressing as directed. Zetuvit Plus 4x8 in Discharge Instruction: Apply over primary dressing as directed. ABD Pad, 8x10 Discharge Instruction: Apply over primary dressing as directed. Secured With Compression Wrap CoFlex TLC XL 2-layer Compression System 4x7 (in/yd) Discharge Instruction: ***May apply first layer of unna boot under the compression wrap.***Apply CoFlex 2-layer compression as directed. (alt for 4 layer) Compression Stockings Add-Ons Electronic Signature(s) Signed: 09/22/2020 6:10:24 PM By: Deon Pilling Signed: 09/23/2020 9:46:23 AM By: Sandre Kitty Entered By: Sandre Kitty on 09/22/2020 16:34:59 -------------------------------------------------------------------------------- Wound Assessment Details Patient Name: Date of Service: [name]. 09/22/2020 12:45 PM Medical Record Number: [id number] Patient Account Number:  [id number] Date of Birth/Sex: Treating RN: 04-04-1971 (50 y.o. [name], Meta.Reding Primary Care Kearsten Ginther: [name] Other Clinician: Referring Shaindel Sweeten: Treating Kazoua Gossen/Extender: Kalman Shan DRA ZEK, DA WN Weeks in Treatment: 14 Wound Status Wound Number: 11 Primary Lymphedema Etiology: Wound Location: Right, Posterior Lower Leg Wound Open Wounding Event: Gradually Appeared Status: Date Acquired: 09/08/2020 Date Acquired: 09/08/2020 Comorbid Lymphedema, Sleep Apnea, Hypertension, Peripheral Venous Weeks Of Treatment: 0 History: Disease, Osteoarthritis, Confinement Anxiety Clustered Wound: No Photos Wound Measurements Length: (cm) 3.5 Width: (cm) 2 Depth: (cm) 0.1 Area: (cm) 5.498 Volume: (cm) 0.55 % Reduction in Area: 0% % Reduction in Volume: 0% Epithelialization: Small (1-33%) Tunneling: No Undermining: No Wound Description Classification: Full Thickness Without Exposed Support Structures Exudate Amount: Large Exudate Type: Serosanguineous Exudate Color: red, brown Foul Odor After Cleansing: No Slough/Fibrino No Wound Bed Granulation Amount: Large (67-100%) Exposed Structure Granulation Quality: Red Fascia Exposed: No Necrotic Amount: None Present (0%) Fat Layer (Subcutaneous Tissue) Exposed: Yes Tendon Exposed: No Muscle Exposed: No Joint Exposed: No Bone Exposed: No Treatment Notes Wound #11 (Lower Leg) Wound Laterality: Right, Posterior Cleanser Wound Cleanser Discharge Instruction: Cleanse the wound with wound cleanser prior to applying a clean dressing using gauze sponges, not tissue or cotton balls. Soap  and Water Discharge Instruction: May shower and wash wound with dial antibacterial soap and water prior to dressing change. Peri-Wound Care Triamcinolone 15 (g) Discharge Instruction: Use triamcinolone 15 (g) as directed Sween Lotion (Moisturizing lotion) Discharge Instruction: Apply moisturizing lotion as directed Zinc Oxide Ointment  30g tube Discharge Instruction: Apply Zinc Oxide to periwound with each dressing change Topical Primary Dressing KerraCel Ag Gelling Fiber Dressing, 4x5 in (silver alginate) Discharge Instruction: Apply silver alginate to wound bed as instructed Secondary Dressing Woven Gauze Sponge, Non-Sterile 4x4 in Discharge Instruction: Apply over primary dressing as directed. Zetuvit Plus 4x8 in Discharge Instruction: Apply over primary dressing as directed. ABD Pad, 8x10 Discharge Instruction: Apply over primary dressing as directed. Secured With Compression Wrap CoFlex TLC XL 2-layer Compression System 4x7 (in/yd) Discharge Instruction: ***May apply first layer of unna boot under the compression wrap.***Apply CoFlex 2-layer compression as directed. (alt for 4 layer) Compression Stockings Add-Ons Electronic Signature(s) Signed: 09/22/2020 6:10:24 PM By: Deon Pilling Signed: 09/23/2020 9:46:23 AM By: Sandre Kitty Entered By: Sandre Kitty on 09/22/2020 16:35:22 -------------------------------------------------------------------------------- Wound Assessment Details Patient Name: Date of Service: [name]. 09/22/2020 12:45 PM Medical Record Number: [id number] Patient Account Number: [id number] Date of Birth/Sex: Treating RN: 02/18/1971 (50 y.o. [name], Meta.Reding Primary Care Wren Pryce: [name] Other Clinician: Referring Alette Kataoka: Treating Moncia Annas/Extender: Kalman Shan DRA ZEK, DA WN Weeks in Treatment: 14 Wound Status Wound Number: 12 Primary Lymphedema Etiology: Wound Location: Right, Proximal, Anterior Lower Leg Wound Open Wounding Event: Gradually Appeared Status: Date Acquired: 09/08/2020 Comorbid Lymphedema, Sleep Apnea, Hypertension, Peripheral Venous Weeks Of Treatment: 0 History: Disease, Osteoarthritis, Confinement Anxiety Clustered Wound: No Photos Wound Measurements Length: (cm) 1 Width: (cm) 1 Depth: (cm) 0.1 Area: (cm) 0.785 Volume:  (cm) 0.079 % Reduction in Area: 0% % Reduction in Volume: 0% Epithelialization: Small (1-33%) Tunneling: No Undermining: No Wound Description Classification: Full Thickness Without Exposed Support Structures Wound Margin: Distinct, outline attached Exudate Amount: Medium Exudate Type: Serosanguineous Exudate Color: red, brown Foul Odor After Cleansing: No Slough/Fibrino No Wound Bed Granulation Amount: Large (67-100%) Exposed Structure Granulation Quality: Red Fascia Exposed: No Necrotic Amount: None Present (0%) Fat Layer (Subcutaneous Tissue) Exposed: Yes Tendon Exposed: No Muscle Exposed: No Joint Exposed: No Bone Exposed: No Treatment Notes Wound #12 (Lower Leg) Wound Laterality: Right, Anterior, Proximal Cleanser Wound Cleanser Discharge Instruction: Cleanse the wound with wound cleanser prior to applying a clean dressing using gauze sponges, not tissue or cotton balls. Soap and Water Discharge Instruction: May shower and wash wound with dial antibacterial soap and water prior to dressing change. Peri-Wound Care Triamcinolone 15 (g) Discharge Instruction: Use triamcinolone 15 (g) as directed Sween Lotion (Moisturizing lotion) Discharge Instruction: Apply moisturizing lotion as directed Zinc Oxide Ointment 30g tube Discharge Instruction: Apply Zinc Oxide to periwound with each dressing change Topical Primary Dressing KerraCel Ag Gelling Fiber Dressing, 4x5 in (silver alginate) Discharge Instruction: Apply silver alginate to wound bed as instructed Secondary Dressing Woven Gauze Sponge, Non-Sterile 4x4 in Discharge Instruction: Apply over primary dressing as directed. Zetuvit Plus 4x8 in Discharge Instruction: Apply over primary dressing as directed. ABD Pad, 8x10 Discharge Instruction: Apply over primary dressing as directed. Secured With Compression Wrap CoFlex TLC XL 2-layer Compression System 4x7 (in/yd) Discharge Instruction: ***May apply first layer of  unna boot under the compression wrap.***Apply CoFlex 2-layer compression as directed. (alt for 4 layer) Compression Stockings Add-Ons Electronic Signature(s) Signed: 09/22/2020 6:10:24 PM By: Deon Pilling Signed: 09/23/2020 9:46:23 AM By: Sandre Kitty Entered By: Sandre Kitty  on 09/22/2020 16:35:41 -------------------------------------------------------------------------------- Wound Assessment Details Patient Name: Date of Service: CALEYA, RYER 09/22/2020 12:45 PM Medical Record Number: [id number] Patient Account Number: [id number] Date of Birth/Sex: Treating RN: 08-Dec-1970 (50 y.o. [name], Meta.Reding Primary Care Kynzlie Hilleary: [name] Other Clinician: Referring Barbi Kumagai: Treating Evalette Montrose/Extender: Kalman Shan DRA ZEK, DA WN Weeks in Treatment: 14 Wound Status Wound Number: 13 Primary Lymphedema Etiology: Wound Location: Right, Anterior Lower Leg Wound Open Wounding Event: Gradually Appeared Status: Date Acquired: 09/08/2020 Comorbid Lymphedema, Sleep Apnea, Hypertension, Peripheral Venous Weeks Of Treatment: 0 Weeks Of Treatment: 0 History: Disease, Osteoarthritis, Confinement Anxiety Clustered Wound: No Photos Wound Measurements Length: (cm) 1.6 Width: (cm) 1.1 Depth: (cm) 0.1 Area: (cm) 1.382 Volume: (cm) 0.138 % Reduction in Area: 0% % Reduction in Volume: 0% Epithelialization: Small (1-33%) Tunneling: No Undermining: No Wound Description Classification: Full Thickness Without Exposed Support Structures Wound Margin: Distinct, outline attached Exudate Amount: Medium Exudate Type: Serosanguineous Exudate Color: red, brown Foul Odor After Cleansing: No Slough/Fibrino No Wound Bed Granulation Amount: Large (67-100%) Exposed Structure Granulation Quality: Red Fascia Exposed: No Necrotic Amount: None Present (0%) Fat Layer (Subcutaneous Tissue) Exposed: Yes Tendon Exposed: No Muscle Exposed: No Joint Exposed: No Bone Exposed:  No Treatment Notes Wound #13 (Lower Leg) Wound Laterality: Right, Anterior Cleanser Wound Cleanser Discharge Instruction: Cleanse the wound with wound cleanser prior to applying a clean dressing using gauze sponges, not tissue or cotton balls. Soap and Water Discharge Instruction: May shower and wash wound with dial antibacterial soap and water prior to dressing change. Peri-Wound Care Triamcinolone 15 (g) Discharge Instruction: Use triamcinolone 15 (g) as directed Sween Lotion (Moisturizing lotion) Discharge Instruction: Apply moisturizing lotion as directed Zinc Oxide Ointment 30g tube Discharge Instruction: Apply Zinc Oxide to periwound with each dressing change Topical Primary Dressing KerraCel Ag Gelling Fiber Dressing, 4x5 in (silver alginate) Discharge Instruction: Apply silver alginate to wound bed as instructed Secondary Dressing Woven Gauze Sponge, Non-Sterile 4x4 in Discharge Instruction: Apply over primary dressing as directed. Zetuvit Plus 4x8 in Discharge Instruction: Apply over primary dressing as directed. ABD Pad, 8x10 Discharge Instruction: Apply over primary dressing as directed. Secured With Compression Wrap CoFlex TLC XL 2-layer Compression System 4x7 (in/yd) Discharge Instruction: ***May apply first layer of unna boot under the compression wrap.***Apply CoFlex 2-layer compression as directed. (alt for 4 layer) Compression Stockings Add-Ons Electronic Signature(s) Signed: 09/22/2020 6:10:24 PM By: Deon Pilling Signed: 09/23/2020 9:46:23 AM By: Sandre Kitty Entered By: Sandre Kitty on 09/22/2020 16:39:17 -------------------------------------------------------------------------------- Vitals Details Patient Name: Date of Service: Erica Dennison Mascot. 09/22/2020 12:45 PM Medical Record Number: [id number] Patient Account Number: [id number] Date of Birth/Sex: Treating RN: March 20, 1971 (50 y.o. [name], Meta.Reding Primary Care Donney Caraveo: [name] Other  Clinician: Referring Emmelia Holdsworth: Treating Halah Whiteside/Extender: Kalman Shan DRA ZEK, DA WN Weeks in Treatment: 14 Vital Signs Time Taken: 13:18 Temperature (F): 97.5 Height (in): 62 Pulse (bpm): 83 Weight (lbs): 304 Respiratory Rate (breaths/min): 18 Body Mass Index (BMI): 55.6 Blood Pressure (mmHg): 135/82 Reference Range: 80 - 120 mg / dl Electronic Signature(s) Signed: 09/23/2020 9:46:23 AM By: Sandre Kitty Entered By: Sandre Kitty on 09/22/2020 13:18:53

## 2020-09-24 ENCOUNTER — Other Ambulatory Visit: Payer: Self-pay | Admitting: Family Medicine

## 2020-09-24 MED ORDER — HYDROCODONE-ACETAMINOPHEN 10-325 MG PO TABS: 1.0000 | ORAL_TABLET | Freq: Four times a day (QID) | ORAL | 0 refills | Status: DC | PRN

## 2020-09-24 NOTE — Telephone Encounter (Signed)
LFD 09/01/20 #90 with no refills LOV 09/04/20 NOV 03/04/21

## 2020-09-28 ENCOUNTER — Other Ambulatory Visit: Payer: Self-pay | Admitting: Family Medicine

## 2020-09-28 NOTE — Telephone Encounter (Signed)
LFD 06/01/20 #30 with 3 refills LOV 09/04/20 NOV 03/04/21

## 2020-09-29 ENCOUNTER — Encounter (HOSPITAL_BASED_OUTPATIENT_CLINIC_OR_DEPARTMENT_OTHER): Payer: Medicare Other | Admitting: Internal Medicine

## 2020-09-30 ENCOUNTER — Encounter: Payer: Self-pay | Admitting: Family Medicine

## 2020-10-02 ENCOUNTER — Encounter (HOSPITAL_BASED_OUTPATIENT_CLINIC_OR_DEPARTMENT_OTHER): Payer: Medicare Other | Admitting: Internal Medicine

## 2020-10-05 ENCOUNTER — Other Ambulatory Visit: Payer: Self-pay

## 2020-10-05 ENCOUNTER — Encounter: Payer: Self-pay | Admitting: Family Medicine

## 2020-10-05 ENCOUNTER — Ambulatory Visit (INDEPENDENT_AMBULATORY_CARE_PROVIDER_SITE_OTHER): Payer: Medicare Other | Admitting: Family Medicine

## 2020-10-05 VITALS — BP 117/80 | HR 71 | Temp 98.9°F | Resp 20 | Ht 62.0 in | Wt 313.0 lb

## 2020-10-05 DIAGNOSIS — R300 Dysuria: Secondary | ICD-10-CM | POA: Diagnosis not present

## 2020-10-05 DIAGNOSIS — R3915 Urgency of urination: Secondary | ICD-10-CM

## 2020-10-05 DIAGNOSIS — L03119 Cellulitis of unspecified part of limb: Secondary | ICD-10-CM | POA: Diagnosis not present

## 2020-10-05 LAB — POCT URINALYSIS DIPSTICK
Bilirubin, UA: NEGATIVE
Blood, UA: NEGATIVE
Glucose, UA: NEGATIVE
Ketones, UA: NEGATIVE
Nitrite, UA: POSITIVE
Protein, UA: POSITIVE — AB
Spec Grav, UA: 1.015 (ref 1.010–1.025)
Urobilinogen, UA: 0.2 E.U./dL
pH, UA: 5 (ref 5.0–8.0)

## 2020-10-05 MED ORDER — CIPROFLOXACIN HCL 500 MG PO TABS: 500.0000 mg | ORAL_TABLET | Freq: Two times a day (BID) | ORAL | 0 refills | Status: DC

## 2020-10-05 NOTE — Progress Notes (Signed)
   Subjective:    Patient ID: [name], female    DOB: 12/10/1970, 50 y.o.   MRN: [id number]  HPI Dysuria- pt reports she will have pain at end of urination.  Started Thursday.  No fevers or chills.  Decreased urinary output- 'i'm afraid it's going to hurt'.  No blood in urine.  No N/V.  + back pain.  Leg wounds- goes back to wound center on Friday but legs have been oozing and weeping for last 2 weeks.  Legs are red and painful.   Review of Systems For ROS see HPI  This visit occurred during the SARS-CoV-2 public health emergency.  Safety protocols were in place, including screening questions prior to the visit, additional usage of staff PPE, and extensive cleaning of exam room while observing appropriate contact time as indicated for disinfecting solutions.       Objective:   Physical Exam Vitals reviewed.  Constitutional:      General: She is not in acute distress.    Appearance: She is well-developed. She is obese. She is not ill-appearing.  Abdominal:     General: There is no distension.     Palpations: Abdomen is soft.     Tenderness: There is no abdominal tenderness (no suprapubic or CVA tenderness). There is no guarding.  Musculoskeletal:        General: Tenderness (TTP over lower back bilaterally) present.  Skin:    General: Skin is warm.     Findings: Erythema (bright red, weeping, L>R) present.  Neurological:     General: No focal deficit present.     Mental Status: She is oriented to person, place, and time.  Psychiatric:        Mood and Affect: Mood normal.        Behavior: Behavior normal.        Thought Content: Thought content normal.           Assessment & Plan:  Dysuria- new.  Pt's sxs and UA are consistent w/ infxn.  Given her multiple allergies and coinciding skin infection, will start Cipro x7 days (rather than the 3 I would typically do for UTI).  Encouraged water intake.  Reviewed supportive care and red flags that should prompt return.  Pt  expressed understanding and is in agreement w/ plan.   Cellulitis- Deteriorated.  L>R.  Legs are bright red, warm, painful, and oozing.  Has appt at wound center on Friday.  Will start Cipro as above to cover both skin and urine.  Pt expressed understanding and is in agreement w/ plan.

## 2020-10-05 NOTE — Patient Instructions (Addendum)
Follow up as needed or as scheduled START the Cipro twice daily- take w/ food Drink LOTS of water Call with any questions or concerns Hang in there!!!

## 2020-10-05 NOTE — Addendum Note (Signed)
Addended by: Lerry Liner on: 10/05/2020 03:39 PM   Modules accepted: Orders

## 2020-10-08 ENCOUNTER — Encounter: Admit: 2020-10-08 | Discharge: 2020-10-09 | Payer: MEDICARE

## 2020-10-08 DIAGNOSIS — L409 Psoriasis, unspecified: Principal | ICD-10-CM

## 2020-10-08 DIAGNOSIS — L52 Erythema nodosum: Principal | ICD-10-CM

## 2020-10-08 DIAGNOSIS — I890 Lymphedema, not elsewhere classified: Principal | ICD-10-CM

## 2020-10-08 LAB — URINE CULTURE
MICRO NUMBER:: 11922635
SPECIMEN QUALITY:: ADEQUATE

## 2020-10-08 NOTE — Unmapped (Signed)
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     PRIOR RHEUMATOLOGIC HISTORY:?? 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   Treatment hx:  -??Started prednisone taper and then mtx in 11/2014.  -??Plaquenil started in 12/2014.??  -??Started imuran in 07/2015, and titrated up to 150 mg qd w/o sufficient control of her PAN.??  - Switched to cellcept??09/2015,??titrated up to 1500 mg BID.??  - Persistent leg swelling and pain, thought to be due to swelling from prednisone, so tapered off prednisone  - Re-biopsied 08/2016, results consistent with??venous stasis disease vs erythema induratum/nodular vasculitis so started on oral potassium iodide and receiving intralesional steroid injections starting in November/December 2018.  - Due to persistent sx, cellcept discontinued and dapsone started by dermatology. Also received intralesional steroid injections with derm. Dapsone caused transaminitis and worsening LE edema so discontinued.  -Was evaluated by vascular surgery and no vascular insufficiency was noted, thus her leg swelling has been attributed to lymphedema.??  - Plaque psoriasis diagnosed by dermatology 2019. Humira started 08/2017.   - HCQ discontinued 09/2017 due to new dx of psoriasis and unclear benefit for PAN.   - Developed TNF induced psoriasis, so humira discontinued and skyrizi started 03/2019  - Current medication regimen: ??skyrizi for psoriasis.     HPI: Autumn Mcdonald is a 50 y.o. 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 Last seen by me in person in August 2019. Since COVID-19 pandemic, has been followed primarily virtually with last virtual visit in September 2021. She was seen more recently in person by Carlus Pavlov [facility] in March 2022. She presents to clinic IN Person today.    Today, patient reports that she is doing fair. She has a sore on her leg that is being treated by wound care. Sore will heal but returns. She has difficulty moving her left leg where the sore has developed. She is on pentoxifylline and Skyrizi. She reports significant redness starting two inches below her left knee with scaling and scabbing involving entire lower left leg. She has some redness and scaling on right leg but less so. Wound care does help but it will flare up.  She has lost 60 lbs since 2019 when I last saw her. She is actively trying to lose weight.  She was not able to participate with lymphedema clinic in the past due to financial issues but has ability to cover now and interested.  She reports significant knee pain attributed to osteoarthritis. No other major joint issues. Psoriasis predominantly affecting her legs.  She was given antibiotic for her legs for presumed cellulitis with clindamycin and it helped.  Recommend 2nd COVID-19 booster.     ROS??: Attests to the above, otherwise, all other systems is negative.   ??  ??  Past Medical and Surgical History:  ??  Patient Active Problem List    Diagnosis Date Noted   ??? Melena 02/16/2017   ??? Chronic narcotic use 09/07/2016   ??? Bleeding stomach ulcer 01/20/2016   ??? Acute blood loss anemia 01/14/2016   ??? BRBPR (bright red blood per rectum) 01/14/2016   ??? Neuropathy  12/09/2015   ??? Migraine headache 11/28/2014   ??? PAN (polyarteritis nodosa) (CMS-HCC) 11/28/2014   ??? Chronic gout of multiple sites 02/25/2014   ??? Gastroesophageal reflux disease 01/08/2014   ??? Leg pain, lateral 12/11/2012   ??? S/P skin biopsy 10/31/2012   ??? Psoriasis 01/09/2012   ??? Degeneration of intervertebral disc 11/30/2011   ??? Cutaneous fungal infection 11/15/2011   ??? DJD (degenerative joint disease) 10/14/2011   ??? Frequent urination 04/13/2011   ??? [name] (obstructive sleep apnea) 11/16/2010   ??? Elevated blood sugar 08/23/2010   ??? Other abnormal glucose 10/08/2009   ??? Contracture of tendon 08/17/2009   ??? Leg pain, right 11/05/2008   ??? Other malaise and fatigue 09/19/2008   ??? Ingrowing nail 06/17/2008   ??? Adverse drug reaction 04/03/2008   ??? Morbid obesity (CMS-HCC) 01/03/2007   ??? Hyperlipidemia 09/22/2006   ??? Pain in joint, lower leg 09/22/2006   ??? Muscle pain 09/20/2006   ??? Fibromyalgia 09/20/2006   ??? Anxiety 06/28/2006   ??? Hypertension 06/28/2006     Past Surgical History:   Procedure Laterality Date   ??? CESAREAN SECTION  1994   ??? CHOLECYSTECTOMY  March 1994   ??? GASTRIC BYPASS  January 2014   ??? HYSTERECTOMY  June 1998   ??? SKIN BIOPSY  2017     Allergies:   ??  Allergies   Allergen Reactions   ??? Iohexol Hives     Pt treated with PO benedryl   ??? Keflex [Cephalexin] Shortness Of Breath and Hives   ??? Lincomycin Hcl Hives   ??? Lisinopril Hives     Hives  Hives   ??? Sulfa (Sulfonamide Antibiotics) Swelling and Rash   ??? Sulfamethoxazole-Trimethoprim Hives     REACTION: Rash, throat closed, eyes swelled.  Hives  Hives   ??? Aspirin      REACTION: Hives  High Doses only. Causes rash.  High Doses only. Causes rash.   ??? Doxycycline      REACTION: Hives  Hives  Hives   ??? Naproxen      REACTION: Hives  Eyes well  Eyes well   ??? Niacin      REACTION: Swelling, problems breathing   ??? Benzoin Compound Rash   ??? Pneumococcal 23-Val Ps Vaccine Rash     cellulitis     Current Outpatient Medications:  ??  Current Outpatient Medications on File Prior to Visit   Medication Sig Dispense Refill   ??? ALPRAZolam (XANAX) 0.5 MG tablet Take 0.5 mg by mouth.     ??? ARIPiprazole (ABILIFY) 10 MG tablet Take 10 mg by mouth daily.     ??? armodafiniL (NUVIGIL) 250 mg tablet Take 250 mg by mouth three (3) times a day.      ??? biotin 10 mg Tab 10 mg per 1 tablet, ORAL, Daily, 0 Refill(s)     ??? buPROPion (WELLBUTRIN XL) 150 MG 24 hr tablet Take 150 mg by mouth.     ??? busPIRone (BUSPAR) 30 MG tablet Take 30 mg by mouth.     ??? calcium citrate (CALCITRATE) 200 mg elem calcium (950 mg) tablet 950 mg per 1 tablet, ORAL, Daily, 60 tablet, 0 Refill(s)     ??? clonazePAM (KLONOPIN) 1 MG tablet TAKE 1 TABLET BY MOUTH EVERY DAY     ??? cyclobenzaprine (FLEXERIL) 10 MG tablet Take 10 mg by mouth once as needed.      ??? DULoxetine (CYMBALTA) 60 MG capsule Take 60 mg by mouth daily.  120 mg daily     ??? ferrous sulfate 325 (65 FE) MG tablet Take 1 tablet (325 mg total) by mouth daily with breakfast.  6   ??? furosemide (LASIX) 80 MG tablet 20 mg.     ??? gabapentin (NEURONTIN) 600 MG tablet Take 2 tablets (1,200 mg total) by mouth Three (3) times a day. 180 tablet 11   ??? HYDROcodone-acetaminophen (NORCO 10-325) 10-325 mg per tablet Take 1 tablet by mouth.     ??? nebivoloL (BYSTOLIC) 10 MG tablet Take 10 mg by mouth daily.      ??? nystatin (MYCOSTATIN) 100,000 unit/gram ointment Apply topically Two (2) times a day. 30 g 0   ??? NYSTOP 100,000 unit/gram powder As needed     ??? omeprazole (PRILOSEC) 20 MG capsule Take 1 capsule by mouth daily.     ??? OXcarbazepine (TRILEPTAL) 300 MG tablet Take 300 mg by mouth.     ??? pantoprazole (PROTONIX) 40 MG tablet Take 40 mg by mouth Two (2) times a day. bid     ??? pentoxifylline (TRENTAL) 400 mg CR tablet Take 1 tablet (400 mg total) by mouth Three (3) times a day with a meal. 270 tablet 3   ??? promethazine (PHENERGAN) 25 MG tablet once as needed.      ??? risankizumab-rzaa (SKYRIZI) 150 mg/mL Syrg Inject the contents of 1 syringe (150 mg) under the skin every 12 weeks. 1 mL 1   ??? rosuvastatin (CRESTOR) 20 MG tablet TAKE 1 TABLET BY MOUTH EVERY DAY       No current facility-administered medications on file prior to visit.       Immunization History   Administered Date(s) Administered   ??? COVID-19 VACC,MRNA,(PFIZER)(PF)(IM) 08/15/2019, 09/09/2019, 01/31/2020   ??? INFLUENZA TIV (TRI) 20MO+ W/ PRESERV (IM) 03/17/2011   ??? Influenza Vaccine Quad (IIV4 PF) 71mo+ injectable 02/17/2015, 05/17/2015, 02/23/2016, 02/13/2017, 01/18/2019   ??? Influenza Virus Vaccine, unspecified formulation 02/27/2013, 06/17/2015, 03/14/2016, 01/11/2018, 03/10/2020   ??? Influenza Whole 02/15/2010   ??? PNEUMOCOCCAL POLYSACCHARIDE 23 03/17/2011, 01/10/2018   ??? Pneumococcal Conjugate 13-Valent 10/04/2017   ??? TdaP 01/05/2016, 07/26/2018   ??? Tetanus-Diptheria Toxoids-TD(TDVAX),Asdorbed,2LF(IM) 05/16/2002     ????  PHYSICAL EXAM?? - limited exam with patient in wheelchair  Vital signs: BP 106/59 (BP Site: L Wrist, BP Position: Sitting, BP Cuff Size: Medium)  - Pulse 68  - Temp 36.7 ??C (98.1 ??F) (Temporal)  - Wt (!) 138.2 kg (304 lb 9.6 oz)  - BMI 55.71 kg/m?? Body mass index is 55.71 kg/m??.  Gen: Pleasant and cooperative. AOx4.?  HEENT:PERRL, face mask in place  Lungs:CTA bilaterally, no wheezing or crackles  CV: RRR, nl S1, S2, no r/m/g  Extremities: Warm, 2+ radial and pedal pulses, no C/C/E.????  Neuro: Good comprehension/cognition. CN 2-12 intact.  ????  Limited Musculoskeletal Examination:????  ?? Jaw, neck without limited ROM.????  ?? Shoulders, elbows, wrists, hands, fingers:  No deformity, swelling, or limited ROM.?? No tenderness.  ?? Unable to assess hips  ?? Valgus deformity  ?? Bilateral legs are swollen with extensive redness and scaling, worse on left with some weeping.  ?? Able to move ankles  Skin: Erythema of lower extremity with scaling, worse on left. Mild weeping at one lesion on left. Seeing wound tomorrow. Does not appear infected    LABORATORY - no recent labs for medication monitoring  ????  ????GENERAL SUMMARY AND IMPRESSION: ????  ????  In summary, the patient is a 50 y.o. female with h/o of possible cutaneous  PAN vs erythema induratum now on pentoxifylline and TNF induced psoriasis being managed with Skyrizi by Dermatology. Morbidly obese but has lost 60 lbs in the past two years. Encouraged patient to continue weight loss journey. Exam is notable for significant lower extremity edema worse on left with bilateral redness and scaling again worse on left. Mild weeping from left leg due to edema. She is followed closely by wound clinic and has appointment tomorrow. She would benefit from evaluation and management with lymphedema clinic and referral placed. Significant valgus deformities of both knees and not a candidate for TKR due to obesity. Recommend topical diclofenac gel for symptomatic pain relief.  If [employer] booster available today, advised patient to receive. Discussed candidacy for Evusheld and she will read literature and consider. Follow-up in 5 months with Carlus Pavlov [facility] and 10 months with myself.    RECOMMENDATIONS: ????  ????   Diagnosis ICD-10-CM Associated Orders   1. Erythema induratum  L52 Ambulatory referral to Lymphedema Clinic     Ambulatory referral to Lymphedema Clinic   2. Lymphedema  I89.0 Ambulatory referral to Lymphedema Clinic     Ambulatory referral to Lymphedema Clinic   3. Psoriasis  L40.9 Ambulatory referral to Lymphedema Clinic     Ambulatory referral to Lymphedema Clinic     Patient Instructions   Referral sent to Lymphedema Clinic with Physical Therapy in [place].    Recommend topical diclofenac (Voltaren) gel for pain relief of your knees    Will plan to give you COVID-19 booster today if available.    If you are candidate for Evusheld if you are interested. It is an antibody based therapy given prior to exposure to COVID-19 to help prevent infection. It has not yet received full FDA approval as still in clinical trials but allowed under emergency use action. Recommend booster first and Evusheld can be given no sooner than two weeks after if you are interested. It involves two shots, one in each buttock followed by one hour observation. You can message me if you are interested and will order and facilitate scheduling. See reading material.     The patient indicates understanding of these issues and agrees to the plan as outlined above.  Contact information provided for any concerns or questions in the interim.  ??  I personally spent 39 minutes face-to-face and non-face-to-face in the care of this patient, which includes all pre, intra, and post visit time on the date of service.      Daphnie Venturini C. Scarlette Calico, MD, PhD  Assistant Professor of Medicine  Department of Medicine/Division of Rheumatology  [facility]  8:12 PM

## 2020-10-08 NOTE — Unmapped (Addendum)
Referral sent to Lymphedema Clinic with Physical Therapy in [place].    Recommend topical diclofenac (Voltaren) gel for pain relief of your knees    Will plan to give you COVID-19 booster today if available.    If you are candidate for Evusheld if you are interested. It is an antibody based therapy given prior to exposure to COVID-19 to help prevent infection. It has not yet received full FDA approval as still in clinical trials but allowed under emergency use action. Recommend booster first and Evusheld can be given no sooner than two weeks after if you are interested. It involves two shots, one in each buttock followed by one hour observation. You can message me if you are interested and will order and facilitate scheduling. See reading material.

## 2020-10-09 ENCOUNTER — Other Ambulatory Visit: Payer: Self-pay

## 2020-10-09 ENCOUNTER — Encounter (HOSPITAL_BASED_OUTPATIENT_CLINIC_OR_DEPARTMENT_OTHER): Payer: Medicare Other | Admitting: Internal Medicine

## 2020-10-09 DIAGNOSIS — I890 Lymphedema, not elsewhere classified: Secondary | ICD-10-CM | POA: Diagnosis not present

## 2020-10-09 DIAGNOSIS — I872 Venous insufficiency (chronic) (peripheral): Secondary | ICD-10-CM | POA: Diagnosis not present

## 2020-10-09 DIAGNOSIS — M170 Bilateral primary osteoarthritis of knee: Secondary | ICD-10-CM | POA: Diagnosis not present

## 2020-10-09 DIAGNOSIS — L97821 Non-pressure chronic ulcer of other part of left lower leg limited to breakdown of skin: Secondary | ICD-10-CM | POA: Diagnosis not present

## 2020-10-09 DIAGNOSIS — L97811 Non-pressure chronic ulcer of other part of right lower leg limited to breakdown of skin: Secondary | ICD-10-CM | POA: Diagnosis not present

## 2020-10-09 DIAGNOSIS — Z8249 Family history of ischemic heart disease and other diseases of the circulatory system: Secondary | ICD-10-CM | POA: Diagnosis not present

## 2020-10-09 DIAGNOSIS — Z833 Family history of diabetes mellitus: Secondary | ICD-10-CM | POA: Diagnosis not present

## 2020-10-09 DIAGNOSIS — M797 Fibromyalgia: Secondary | ICD-10-CM | POA: Diagnosis not present

## 2020-10-09 DIAGNOSIS — M300 Polyarteritis nodosa: Secondary | ICD-10-CM | POA: Diagnosis not present

## 2020-10-09 DIAGNOSIS — I87333 Chronic venous hypertension (idiopathic) with ulcer and inflammation of bilateral lower extremity: Secondary | ICD-10-CM | POA: Diagnosis not present

## 2020-10-09 DIAGNOSIS — I10 Essential (primary) hypertension: Secondary | ICD-10-CM | POA: Diagnosis not present

## 2020-10-09 NOTE — Progress Notes (Addendum)
[name], [name] ([id number]) Visit Report for 10/09/2020 Arrival Information Details Patient Name: Date of Service: [name], [name] 10/09/2020 10:15 A M Medical Record Number: [id number] Patient Account Number: [id number] Date of Birth/Sex: Treating RN: 26-Aug-1970 (50 y.o. [name] Primary Care Shaarav Ripple: [name] Other Clinician: Referring Keighan Amezcua: Treating Kalib Bhagat/Extender: Judeth Porch in Treatment: 16 Visit Information History Since Last Visit Added or deleted any medications: No Patient Arrived: Ambulatory Any new allergies or adverse reactions: No Arrival Time: 10:37 Had a fall or experienced change in No Accompanied By: self activities of daily living that may affect Transfer Assistance: None risk of falls: Patient Identification Verified: Yes Signs or symptoms of abuse/neglect since last visito No Secondary Verification Process Completed: Yes Hospitalized since last visit: No Patient Requires Transmission-Based Precautions: No Implantable device outside of the clinic excluding No Patient Has Alerts: No cellular tissue based products placed in the center since last visit: Has Dressing in Place as Prescribed: No Has Compression in Place as Prescribed: No Pain Present Now: Yes Notes pt reports removed wraps and dressings on Sunday due to pain. wounds open to air and draining clear fluid Electronic Signature(s) Signed: 10/09/2020 1:55:00 PM By: Baruch Gouty RN, BSN Entered By: Baruch Gouty on 10/09/2020 10:38:19 -------------------------------------------------------------------------------- Compression Therapy Details Patient Name: Date of Service: Erica Devoid K. 10/09/2020 10:15 A M Medical Record Number: [id number] Patient Account Number: [id number] Date of Birth/Sex: Treating RN: [birth year] (50 y.o. [name] Primary Care Khali Albanese: [name] Other Clinician: Referring Maleah Rabago: Treating  Buren Havey/Extender: Judeth Porch in Treatment: 16 Compression Therapy Performed for Wound Assessment: Wound #10 Left,Circumferential Lower Leg Performed By: Clinician Lorrin Jackson, RN Compression Type: Three Layer Post Procedure Diagnosis Same as Pre-procedure Electronic Signature(s) Signed: 10/09/2020 1:31:18 PM By: Lorrin Jackson Entered By: Lorrin Jackson on 10/09/2020 11:11:03 -------------------------------------------------------------------------------- Compression Therapy Details Patient Name: Date of Service: [name] 10/09/2020 10:15 A M Medical Record Number: [id number] Patient Account Number: [id number] Date of Birth/Sex: Treating RN: 1970/09/14 (50 y.o. [name] Primary Care Fredick Schlosser: [name] Other Clinician: Referring Yatzil Clippinger: Treating Humphrey Guerreiro/Extender: Judeth Porch in Treatment: 16 Compression Therapy Performed for Wound Assessment: Wound #11 Right,Posterior Lower Leg Performed By: Clinician Lorrin Jackson, RN Compression Type: Three Layer Post Procedure Diagnosis Same as Pre-procedure Electronic Signature(s) Signed: 10/09/2020 1:31:18 PM By: Lorrin Jackson Entered By: Lorrin Jackson on 10/09/2020 11:11:03 -------------------------------------------------------------------------------- Compression Therapy Details Patient Name: Date of Service: [name] 10/09/2020 10:15 A M Medical Record Number: [id number] Patient Account Number: [id number] Date of Birth/Sex: Treating RN: 1970/10/22 (50 y.o. [name] Primary Care Andilynn Delavega: [name] Other Clinician: Referring Rhianne Soman: Treating Jlee Harkless/Extender: Judeth Porch in Treatment: 16 Compression Therapy Performed for Wound Assessment: Wound #12 Right,Proximal,Anterior Lower Leg Performed By: Clinician Lorrin Jackson, RN Compression Type: Three Layer Post Procedure Diagnosis Same  as Pre-procedure Electronic Signature(s) Signed: 10/09/2020 1:31:18 PM By: Lorrin Jackson Entered By: Lorrin Jackson on 10/09/2020 11:11:03 -------------------------------------------------------------------------------- Compression Therapy Details Patient Name: Date of Service: [name] 10/09/2020 10:15 A M Medical Record Number: [id number] Patient Account Number: [id number] Date of Birth/Sex: Treating RN: 1971/02/10 (50 y.o. [name] Primary Care Jennings Corado: [name] Other Clinician: Referring Melena Hayes: Treating Sanford Lindblad/Extender: Judeth Porch in Treatment: 16 Compression Therapy Performed for Wound Assessment: Wound #13 Right,Anterior Lower Leg Performed By: Clinician Lorrin Jackson, RN Compression Type: Three Layer Post Procedure Diagnosis Same as Pre-procedure Electronic Signature(s) Signed: 10/09/2020  1:31:18 PM By: Lorrin Jackson Entered By: Lorrin Jackson on 10/09/2020 11:11:03 -------------------------------------------------------------------------------- Encounter Discharge Information Details Patient Name: Date of Service: [name], [name] 10/09/2020 10:15 A M Medical Record Number: [id number] Patient Account Number: [id number] Date of Birth/Sex: Treating RN: 03-23-1971 (50 y.o. [name], Tammi Klippel Primary Care Cyntia Staley: [name] Other Clinician: Referring Allison Silva: Treating Fatemah Pourciau/Extender: Judeth Porch in Treatment: 16 Encounter Discharge Information Items Discharge Condition: Stable Ambulatory Status: Wheelchair Discharge Destination: Home Transportation: Private Auto Accompanied By: self Schedule Follow-up Appointment: Yes Clinical Summary of Care: Electronic Signature(s) Signed: 10/09/2020 1:38:01 PM By: Deon Pilling Entered By: Deon Pilling on 10/09/2020 13:37:31 -------------------------------------------------------------------------------- Lower Extremity  Assessment Details Patient Name: Date of Service: [name], [name] 10/09/2020 10:15 A M Medical Record Number: [id number] Patient Account Number: [id number] Date of Birth/Sex: Treating RN: 1971-02-05 (50 y.o. [name] Primary Care Doyne Ellinger: [name] Other Clinician: Referring America Sandall: Treating Aaryan Essman/Extender: Judeth Porch in Treatment: 16 Edema Assessment Assessed: Shirlyn Goltz: No] Patrice Paradise: No] Edema: [Left: Yes] [Right: Yes] Calf Left: Right: Point of Measurement: 28 cm From Medial Instep 55 cm 49 cm Ankle Left: Right: Point of Measurement: 11 cm From Medial Instep 31.4 cm 29 cm Vascular Assessment Pulses: Dorsalis Pedis Palpable: [Left:No] [Right:Yes] Electronic Signature(s) Signed: 10/09/2020 1:55:00 PM By: Baruch Gouty RN, BSN Entered By: Baruch Gouty on 10/09/2020 10:44:46 -------------------------------------------------------------------------------- Multi Wound Chart Details Patient Name: Date of Service: Erica Devoid K. 10/09/2020 10:15 A M Medical Record Number: [id number] Patient Account Number: [id number] Date of Birth/Sex: Treating RN: 08/19/1970 (50 y.o. [name] Primary Care Ramona Slinger: [name] Other Clinician: Referring Xaniyah Buchholz: Treating Constancia Geeting/Extender: Judeth Porch in Treatment: 16 Vital Signs Height(in): 62 Pulse(bpm): 81 Weight(lbs): 304 Blood Pressure(mmHg): 132/80 Body Mass Index(BMI): 56 Temperature(F): 97.9 Respiratory Rate(breaths/min): 20 Photos: [10:No Photos Left, Circumferential Lower Leg] [11:No Photos Right, Posterior Lower Leg] [12:No Photos Right, Proximal, Anterior Lower Leg] Wound Location: [10:Gradually Appeared] [11:Gradually Appeared] [12:Gradually Appeared] Wounding Event: [10:Lymphedema] [11:Lymphedema] [12:Lymphedema] Primary Etiology: [10:Lymphedema, Sleep Apnea,] [11:Lymphedema, Sleep Apnea,] [12:Lymphedema, Sleep  Apnea,] Comorbid History: [10:Hypertension, Peripheral Venous Disease, Osteoarthritis, Confinement Disease, Osteoarthritis, Confinement Disease, Osteoarthritis, Confinement Anxiety 09/08/2020] [11:Hypertension, Peripheral Venous Anxiety 09/08/2020]  [12:Hypertension, Peripheral Venous Anxiety 09/08/2020] Date Acquired: [10:2] [11:2] [12:2] Weeks of Treatment: [10:Open] [11:Open] [12:Open] Wound Status: [10:9x26x0.1] [11:1.8x1.5x0.1] [12:0x0x0] Measurements L x W x D (cm) [10:183.783] [11:2.121] [12:0] A (cm) : rea [10:18.378] [11:0.212] [12:0] Volume (cm) : [10:30.10%] [11:61.40%] [12:100.00%] % Reduction in Area: [10:30.20%] [11:61.50%] [12:100.00%] % Reduction in Volume: [10:Full Thickness Without Exposed] [11:Full Thickness Without Exposed] [12:Full Thickness Without Exposed] Classification: [10:Support Structures Large] [11:Support Structures Medium] [12:Support Structures None Present] Exudate Amount: [10:Serous] [11:Serous] [12:N/A] Exudate Type: [10:amber] [11:amber] [12:N/A] Exudate Color: [10:Indistinct, nonvisible] [11:Indistinct, nonvisible] [12:N/A] Wound Margin: [10:Large (67-100%)] [11:Small (1-33%)] [12:None Present (0%)] Granulation Amount: [10:Pink] [11:Red, Pink] [12:N/A] Granulation Quality: [10:Small (1-33%)] [11:Large (67-100%)] [12:None Present (0%)] Necrotic Amount: [10:Fat Layer (Subcutaneous Tissue): Yes Fat Layer (Subcutaneous Tissue): Yes Fascia: No] Exposed Structures: [10:Fascia: No Tendon: No Muscle: No Joint: No Bone: No Medium (34-66%)] [11:Fascia: No Tendon: No Muscle: No Joint: No Bone: No Medium (34-66%)] [12:Fat Layer (Subcutaneous Tissue): No Tendon: No Muscle: No Joint: No Bone: No Large (67-100%)] Epithelialization: [10:Compression Therapy] [11:Compression Therapy] [12:Compression [place] Wound Number: 13 N/A N/A Photos: No Photos N/A N/A Right, Anterior Lower Leg N/A N/A Wound Location: Gradually Appeared N/A N/A Wounding Event: Lymphedema N/A  N/A Primary Etiology: Lymphedema, Sleep Apnea, N/A N/A Comorbid History: Hypertension, Peripheral Venous  Disease, Osteoarthritis, Confinement Anxiety 09/08/2020 N/A N/A Date Acquired: 2 N/A N/A Weeks of Treatment: Open N/A N/A Wound Status: 1.3x2x0.1 N/A N/A Measurements L x W x D (cm) 2.042 N/A N/A A (cm) : rea 0.204 N/A N/A Volume (cm) : -47.80% N/A N/A % Reduction in Area: -47.80% N/A N/A % Reduction in Volume: Full Thickness Without Exposed N/A N/A Classification: Support Structures Large N/A N/A Exudate Amount: Serous N/A N/A Exudate Type: amber N/A N/A Exudate Color: Flat and Intact N/A N/A Wound Margin: Large (67-100%) N/A N/A Granulation Amount: Red, Pink N/A N/A Granulation Quality: None Present (0%) N/A N/A Necrotic Amount: Fat Layer (Subcutaneous Tissue): Yes N/A N/A Exposed Structures: Fascia: No Tendon: No Muscle: No Joint: No Bone: No Small (1-33%) N/A N/A Epithelialization: Compression Therapy N/A N/A Procedures Performed: Treatment Notes Electronic Signature(s) Signed: 10/09/2020 1:24:48 PM By: Kalman Shan DO Signed: 10/09/2020 1:31:18 PM By: Lorrin Jackson Entered By: Kalman Shan on 10/09/2020 13:10:36 -------------------------------------------------------------------------------- Multi-Disciplinary Care Plan Details Patient Name: Date of Service: Erica Devoid K. 10/09/2020 10:15 A M Medical Record Number: [id number] Patient Account Number: [id number] Date of Birth/Sex: Treating RN: [birth year] (50 y.o. [name] Primary Care Leita Lindbloom: [name] Other Clinician: Referring Marijah Larranaga: Treating Elisabet Gutzmer/Extender: Judeth Porch in Treatment: 16 Active Inactive Abuse / Safety / Falls / Self Care Management Nursing Diagnoses: Potential for falls Goals: Patient will remain injury free related to falls Date Initiated: 09/22/2020 Target Resolution Date: 11/20/2020 Goal Status:  Active Interventions: Assess fall risk on admission and as needed Assess: immobility, friction, shearing, incontinence upon admission and as needed Assess self care needs on admission and as needed Provide education on fall prevention Notes: Venous Leg Ulcer Nursing Diagnoses: Potential for venous Insuffiency (use before diagnosis confirmed) Goals: Patient will maintain optimal edema control Date Initiated: 09/22/2020 Target Resolution Date: 10/16/2020 Goal Status: Active Interventions: Assess peripheral edema status every visit. Compression as ordered Provide education on venous insufficiency Notes: Electronic Signature(s) Signed: 10/09/2020 11:00:09 AM By: Lorrin Jackson Entered By: Lorrin Jackson on 10/09/2020 11:00:08 -------------------------------------------------------------------------------- Pain Assessment Details Patient Name: Date of Service: [name], [name] 10/09/2020 10:15 A M Medical Record Number: [id number] Patient Account Number: [id number] Date of Birth/Sex: Treating RN: 1970/08/28 (50 y.o. [name] Primary Care Areana Kosanke: [name] Other Clinician: Referring Samira Acero: Treating Porsha Skilton/Extender: Judeth Porch in Treatment: 16 Active Problems Location of Pain Severity and Description of Pain Patient Has Paino Yes Site Locations Pain Location: Pain in Ulcers With Dressing Change: Yes Duration of the Pain. Constant / Intermittento Intermittent Rate the pain. Current Pain Level: 4 Worst Pain Level: 7 Least Pain Level: 0 Character of Pain Describe the Pain: Aching, Throbbing Pain Management and Medication Current Pain Management: Medication: Yes Is the Current Pain Management Adequate: Adequate Rest: Yes How does your wound impact your activities of daily livingo Sleep: Yes Bathing: No Appetite: No Relationship With Others: No Bladder Continence: No Emotions: Yes Bowel Continence: No Work:  No Toileting: No Drive: No Dressing: No Hobbies: No Electronic Signature(s) Signed: 10/09/2020 1:55:00 PM By: Baruch Gouty RN, BSN Entered By: Baruch Gouty on 10/09/2020 10:42:17 -------------------------------------------------------------------------------- Patient/Caregiver Education Details Patient Name: Date of Service: [name] 5/27/2022andnbsp10:15 A M Medical Record Number: [id number] Patient Account Number: [id number] Date of Birth/Gender: Treating RN: May 18, 1970 (50 y.o. [name] Primary Care Physician: [name] Other Clinician: Referring Physician: Treating Physician/Extender: Judeth Porch in Treatment: 16 Education Assessment Education Provided To: Patient Education Topics Provided  Venous: Methods: Explain/Verbal, Printed Responses: State content correctly Wound/Skin Impairment: Methods: Explain/Verbal, Printed Responses: State content correctly Electronic Signature(s) Signed: 10/09/2020 1:31:18 PM By: Lorrin Jackson Entered By: Lorrin Jackson on 10/09/2020 11:05:45 -------------------------------------------------------------------------------- Wound Assessment Details Patient Name: Date of Service: [name], [name] 10/09/2020 10:15 A M Medical Record Number: [id number] Patient Account Number: [id number] Date of Birth/Sex: Treating RN: 1971/05/05 (49 y.o. [name] Primary Care Raekwon Winkowski: [name] Other Clinician: Referring Karletta Millay: Treating Aodhan Scheidt/Extender: Judeth Porch in Treatment: 16 Wound Status Wound Number: 10 Primary Lymphedema Etiology: Wound Location: Left, Circumferential Lower Leg Wound Open Wounding Event: Gradually Appeared Status: Date Acquired: 09/08/2020 Comorbid Lymphedema, Sleep Apnea, Hypertension, Peripheral Venous Weeks Of Treatment: 2 History: Disease, Osteoarthritis, Confinement Anxiety Clustered Wound: No Photos Wound  Measurements Length: (cm) 9 Width: (cm) 26 Depth: (cm) 0.1 Area: (cm) 183.783 Volume: (cm) 18.378 % Reduction in Area: 30.1% % Reduction in Volume: 30.2% Epithelialization: Medium (34-66%) Tunneling: No Undermining: No Wound Description Classification: Full Thickness Without Exposed Support Structures Wound Margin: Indistinct, nonvisible Exudate Amount: Large Exudate Type: Serous Exudate Color: amber Foul Odor After Cleansing: No Slough/Fibrino Yes Wound Bed Granulation Amount: Large (67-100%) Exposed Structure Granulation Quality: Pink Fascia Exposed: No Necrotic Amount: Small (1-33%) Fat Layer (Subcutaneous Tissue) Exposed: Yes Necrotic Quality: Adherent Slough Tendon Exposed: No Muscle Exposed: No Joint Exposed: No Bone Exposed: No Treatment Notes Wound #10 (Lower Leg) Wound Laterality: Left, Circumferential Cleanser Wound Cleanser Discharge Instruction: Cleanse the wound with wound cleanser prior to applying a clean dressing using gauze sponges, not tissue or cotton balls. Soap and Water Discharge Instruction: May shower and wash wound with dial antibacterial soap and water prior to dressing change. Peri-Wound Care Triamcinolone 15 (g) Discharge Instruction: Use triamcinolone 15 (g) as directed Sween Lotion (Moisturizing lotion) Discharge Instruction: Apply moisturizing lotion as directed Zinc Oxide Ointment 30g tube Discharge Instruction: Apply Zinc Oxide to periwound with each dressing change Topical Primary Dressing KerraCel Ag Gelling Fiber Dressing, 4x5 in (silver alginate) Discharge Instruction: Apply silver alginate to wound bed as instructed Secondary Dressing Woven Gauze Sponge, Non-Sterile 4x4 in Discharge Instruction: Apply over primary dressing as directed. Zetuvit Plus 4x8 in Discharge Instruction: Apply over primary dressing as directed. ABD Pad, 8x10 Discharge Instruction: Apply over primary dressing as directed. Secured With Compression  Wrap ThreePress (3 layer compression wrap) Discharge Instruction: Apply three layer compression as directed. Compression Stockings Add-Ons Electronic Signature(s) Signed: 10/13/2020 5:24:49 PM By: Sandre Kitty Signed: 10/14/2020 6:06:42 PM By: Baruch Gouty RN, BSN Previous Signature: 10/09/2020 1:55:00 PM Version By: Baruch Gouty RN, BSN Entered By: Sandre Kitty on 10/13/2020 11:28:20 -------------------------------------------------------------------------------- Wound Assessment Details Patient Name: Date of Service: Erica Devoid K. 10/09/2020 10:15 A M Medical Record Number: [id number] Patient Account Number: [id number] Date of Birth/Sex: Treating RN: 11-26-1970 (50 y.o. [name] Primary Care Sabriya Yono: [name] Other Clinician: Referring Patria Warzecha: Treating Alisha Bacus/Extender: Judeth Porch in Treatment: 16 Wound Status Wound Number: 11 Primary Lymphedema Etiology: Wound Location: Right, Posterior Lower Leg Wound Open Wounding Event: Gradually Appeared Status: Date Acquired: 09/08/2020 Comorbid Lymphedema, Sleep Apnea, Hypertension, Peripheral Venous Weeks Of Treatment: 2 History: Disease, Osteoarthritis, Confinement Anxiety Clustered Wound: No Photos Wound Measurements Length: (cm) 1.8 Width: (cm) 1.5 Depth: (cm) 0.1 Area: (cm) 2.121 Volume: (cm) 0.212 % Reduction in Area: 61.4% % Reduction in Volume: 61.5% Epithelialization: Medium (34-66%) Tunneling: No Undermining: No Wound Description Classification: Full Thickness Without Exposed Support Structures Wound Margin: Indistinct, nonvisible Exudate Amount: Medium Exudate Type:  Serous Exudate Color: amber Foul Odor After Cleansing: No Slough/Fibrino No Wound Bed Granulation Amount: Small (1-33%) Exposed Structure Granulation Quality: Red, Pink Fascia Exposed: No Necrotic Amount: Large (67-100%) Fat Layer (Subcutaneous Tissue) Exposed: Yes Necrotic  Quality: Adherent Slough Tendon Exposed: No Muscle Exposed: No Joint Exposed: No Bone Exposed: No Treatment Notes Wound #11 (Lower Leg) Wound Laterality: Right, Posterior Cleanser Wound Cleanser Discharge Instruction: Cleanse the wound with wound cleanser prior to applying a clean dressing using gauze sponges, not tissue or cotton balls. Soap and Water Discharge Instruction: May shower and wash wound with dial antibacterial soap and water prior to dressing change. Peri-Wound Care Triamcinolone 15 (g) Discharge Instruction: Use triamcinolone 15 (g) as directed Sween Lotion (Moisturizing lotion) Discharge Instruction: Apply moisturizing lotion as directed Zinc Oxide Ointment 30g tube Discharge Instruction: Apply Zinc Oxide to periwound with each dressing change Topical Primary Dressing KerraCel Ag Gelling Fiber Dressing, 4x5 in (silver alginate) Discharge Instruction: Apply silver alginate to wound bed as instructed Secondary Dressing Woven Gauze Sponge, Non-Sterile 4x4 in Discharge Instruction: Apply over primary dressing as directed. Zetuvit Plus 4x8 in Discharge Instruction: Apply over primary dressing as directed. ABD Pad, 8x10 Discharge Instruction: Apply over primary dressing as directed. Secured With Compression Wrap ThreePress (3 layer compression wrap) Discharge Instruction: Apply three layer compression as directed. Compression Stockings Add-Ons Electronic Signature(s) Signed: 10/13/2020 5:24:49 PM By: Sandre Kitty Signed: 10/14/2020 6:06:42 PM By: Baruch Gouty RN, BSN Previous Signature: 10/09/2020 1:55:00 PM Version By: Baruch Gouty RN, BSN Entered By: Sandre Kitty on 10/13/2020 11:27:35 -------------------------------------------------------------------------------- Wound Assessment Details Patient Name: Date of Service: Erica Devoid K. 10/09/2020 10:15 A M Medical Record Number: [id number] Patient Account Number: [id number] Date of  Birth/Sex: Treating RN: 04/27/1971 (50 y.o. [name] Primary Care Millenia Waldvogel: [name] Other Clinician: Referring Chelsia Serres: Treating Ilayda Toda/Extender: Judeth Porch in Treatment: 16 Wound Status Wound Number: 12 Primary Lymphedema Etiology: Wound Location: Right, Proximal, Anterior Lower Leg Wound Open Wounding Event: Gradually Appeared Status: Date Acquired: 09/08/2020 Comorbid Lymphedema, Sleep Apnea, Hypertension, Peripheral Venous Weeks Of Treatment: 2 History: Disease, Osteoarthritis, Confinement Anxiety Clustered Wound: No Wound Measurements Length: (cm) Width: (cm) Depth: (cm) Area: (cm) Volume: (cm) 0 % Reduction in Area: 100% 0 % Reduction in Volume: 100% 0 Epithelialization: Large (67-100%) 0 Tunneling: No 0 Undermining: No Wound Description Classification: Full Thickness Without Exposed Support Structures Exudate Amount: None Present Foul Odor After Cleansing: No Slough/Fibrino No Wound Bed Granulation Amount: None Present (0%) Exposed Structure Necrotic Amount: None Present (0%) Fascia Exposed: No Fat Layer (Subcutaneous Tissue) Exposed: No Tendon Exposed: No Muscle Exposed: No Joint Exposed: No Bone Exposed: No Treatment Notes Wound #12 (Lower Leg) Wound Laterality: Right, Anterior, Proximal Cleanser Wound Cleanser Discharge Instruction: Cleanse the wound with wound cleanser prior to applying a clean dressing using gauze sponges, not tissue or cotton balls. Soap and Water Discharge Instruction: May shower and wash wound with dial antibacterial soap and water prior to dressing change. Peri-Wound Care Triamcinolone 15 (g) Discharge Instruction: Use triamcinolone 15 (g) as directed Sween Lotion (Moisturizing lotion) Discharge Instruction: Apply moisturizing lotion as directed Zinc Oxide Ointment 30g tube Discharge Instruction: Apply Zinc Oxide to periwound with each dressing change Topical Primary  Dressing KerraCel Ag Gelling Fiber Dressing, 4x5 in (silver alginate) Discharge Instruction: Apply silver alginate to wound bed as instructed Secondary Dressing Woven Gauze Sponge, Non-Sterile 4x4 in Discharge Instruction: Apply over primary dressing as directed. Zetuvit Plus 4x8 in Discharge Instruction: Apply over primary dressing as  directed. ABD Pad, 8x10 Discharge Instruction: Apply over primary dressing as directed. Secured With Compression Wrap ThreePress (3 layer compression wrap) Discharge Instruction: Apply three layer compression as directed. Compression Stockings Add-Ons Electronic Signature(s) Signed: 10/09/2020 1:55:00 PM By: Baruch Gouty RN, BSN Entered By: Baruch Gouty on 10/09/2020 10:54:13 -------------------------------------------------------------------------------- Wound Assessment Details Patient Name: Date of Service: Erica Devoid K. 10/09/2020 10:15 A M Medical Record Number: [id number] Patient Account Number: [id number] Date of Birth/Sex: Treating RN: 1971/03/27 (50 y.o. [name] Primary Care Wannetta Langland: [name] Other Clinician: Referring Greely Atiyeh: Treating Logen Fowle/Extender: Judeth Porch in Treatment: 16 Wound Status Wound Number: 13 Primary Lymphedema Etiology: Etiology: Wound Location: Right, Anterior Lower Leg Wound Open Wounding Event: Gradually Appeared Status: Date Acquired: 09/08/2020 Comorbid Lymphedema, Sleep Apnea, Hypertension, Peripheral Venous Weeks Of Treatment: 2 History: Disease, Osteoarthritis, Confinement Anxiety Clustered Wound: No Photos Wound Measurements Length: (cm) 1.3 Width: (cm) 2 Depth: (cm) 0.1 Area: (cm) 2.042 Volume: (cm) 0.204 % Reduction in Area: -47.8% % Reduction in Volume: -47.8% Epithelialization: Small (1-33%) Tunneling: No Undermining: No Wound Description Classification: Full Thickness Without Exposed Support Structures Wound Margin: Flat and  Intact Exudate Amount: Large Exudate Type: Serous Exudate Color: amber Foul Odor After Cleansing: No Slough/Fibrino No Wound Bed Granulation Amount: Large (67-100%) Exposed Structure Granulation Quality: Red, Pink Fascia Exposed: No Necrotic Amount: None Present (0%) Fat Layer (Subcutaneous Tissue) Exposed: Yes Tendon Exposed: No Muscle Exposed: No Joint Exposed: No Bone Exposed: No Treatment Notes Wound #13 (Lower Leg) Wound Laterality: Right, Anterior Cleanser Wound Cleanser Discharge Instruction: Cleanse the wound with wound cleanser prior to applying a clean dressing using gauze sponges, not tissue or cotton balls. Soap and Water Discharge Instruction: May shower and wash wound with dial antibacterial soap and water prior to dressing change. Peri-Wound Care Triamcinolone 15 (g) Discharge Instruction: Use triamcinolone 15 (g) as directed Sween Lotion (Moisturizing lotion) Discharge Instruction: Apply moisturizing lotion as directed Zinc Oxide Ointment 30g tube Discharge Instruction: Apply Zinc Oxide to periwound with each dressing change Topical Primary Dressing KerraCel Ag Gelling Fiber Dressing, 4x5 in (silver alginate) Discharge Instruction: Apply silver alginate to wound bed as instructed Secondary Dressing Woven Gauze Sponge, Non-Sterile 4x4 in Discharge Instruction: Apply over primary dressing as directed. Zetuvit Plus 4x8 in Discharge Instruction: Apply over primary dressing as directed. ABD Pad, 8x10 Discharge Instruction: Apply over primary dressing as directed. Secured With Compression Wrap ThreePress (3 layer compression wrap) Discharge Instruction: Apply three layer compression as directed. Compression Stockings Add-Ons Electronic Signature(s) Signed: 10/13/2020 5:24:49 PM By: Sandre Kitty Signed: 10/14/2020 6:06:42 PM By: Baruch Gouty RN, BSN Previous Signature: 10/09/2020 1:55:00 PM Version By: Baruch Gouty RN, BSN Entered By: Sandre Kitty  on 10/13/2020 11:27:58 -------------------------------------------------------------------------------- Vitals Details Patient Name: Date of Service: Erica Devoid K. 10/09/2020 10:15 A M Medical Record Number: [id number] Patient Account Number: [id number] Date of Birth/Sex: Treating RN: [birth year] (50 y.o. [name] Primary Care Keyari Kleeman: [name] Other Clinician: Referring Lazarius Rivkin: Treating Lourdes Manning/Extender: Judeth Porch in Treatment: 16 Vital Signs Time Taken: 10:38 Temperature (F): 97.9 Height (in): 62 Pulse (bpm): 81 Weight (lbs): 304 Respiratory Rate (breaths/min): 20 Body Mass Index (BMI): 55.6 Blood Pressure (mmHg): 132/80 Reference Range: 80 - 120 mg / dl Electronic Signature(s) Signed: 10/09/2020 1:55:00 PM By: Baruch Gouty RN, BSN Entered By: Baruch Gouty on 10/09/2020 10:41:20

## 2020-10-09 NOTE — Progress Notes (Signed)
[name], HOFSTRA ([id number]) Visit Report for 10/09/2020 Chief Complaint Document Details Patient Name: Date of Service: [name], KAGE 10/09/2020 10:15 A M Medical Record Number: [id number] Patient Account Number: [id number] Date of Birth/Sex: Treating RN: 04-03-1971 (50 y.o. [name] Primary Care Provider: [name] Other Clinician: Referring Provider: Treating Provider/Extender: Judeth Porch in Treatment: 16 Information Obtained from: Patient Chief Complaint Bilateral lower extremity wounds, left greater than right Electronic Signature(s) Signed: 10/09/2020 1:24:48 PM By: Kalman Shan DO Entered By: Kalman Shan on 10/09/2020 13:10:43 -------------------------------------------------------------------------------- HPI Details Patient Name: Date of Service: [name]. 10/09/2020 10:15 A M Medical Record Number: [id number] Patient Account Number: [id number] Date of Birth/Sex: Treating RN: 05-08-1971 (50 y.o. [name] Primary Care Provider: [name] Other Clinician: Referring Provider: Treating Provider/Extender: Judeth Porch in Treatment: 34 History of Present Illness HPI Description: [name] is a 50 year old female with a past medical history of psoriasis, hyperlipidemia, chronic lower extremity edema as described, cutaneous polyarteritis nodosa, sleep apnea, fibromyalgia, and osteoarthritis of both knees. She was recently followed in our clinic in February for her lower extremity wounds due to chronic venous insufficiency and lymphedema. She was treated with compression wraps and Silver alginate And occasionally with Hydrofera Blue. These closed after a couple months of treatment. During that time she received juxta lite compressions. She was ordered lymphedema pumps to but these had not arrived prior to her wound closure. Today she presents with reopening of her lower extremity  wounds. She has still not received her lymphedema pumps. She states she has been using juxta lite compressions daily. She denies acute signs of infection. 5/27; patient presents for follow-up. She was last seen 2 weeks ago. She could not make it to her last week appointment due to feeling sick. She left the wraps on for over a week and now is feeling increased pain to the lower extremities. She took them off 5 days ago and has noticed increased swelling. She has tried using her juxta light compression. Electronic Signature(s) Signed: 10/09/2020 1:24:48 PM By: Kalman Shan DO Entered By: Kalman Shan on 10/09/2020 13:11:02 -------------------------------------------------------------------------------- Physical Exam Details Patient Name: Date of Service: [name], [name] 10/09/2020 10:15 A M Medical Record Number: [id number] Patient Account Number: [id number] Date of Birth/Sex: Treating RN: 1970-08-06 (50 y.o. [name] Primary Care Provider: [name] Other Clinician: Referring Provider: Treating Provider/Extender: Judeth Porch in Treatment: 16 Constitutional respirations regular, non-labored and within target range for patient.Marland Kitchen Psychiatric pleasant and cooperative. Notes Bilateral lower extremities with scattered open wounds limited to skin breakdown. Increased erythema compared to last clinic visit. Patient reports increased pain. Electronic Signature(s) Signed: 10/09/2020 1:24:48 PM By: Kalman Shan DO Entered By: Kalman Shan on 10/09/2020 13:21:50 -------------------------------------------------------------------------------- Physician Orders Details Patient Name: Date of Service: [name] 10/09/2020 10:15 A M Medical Record Number: [id number] Patient Account Number: [id number] Date of Birth/Sex: Treating RN: [birth year] (50 y.o. [name] Primary Care Provider: [name] Other Clinician: Referring  Provider: Treating Provider/Extender: Judeth Porch in Treatment: 16 Verbal / Phone Orders: No Diagnosis Coding ICD-10 Coding Code Description [phone] Chronic venous hypertension (idiopathic) with ulcer and inflammation of bilateral lower extremity L97.821 Non-pressure chronic ulcer of other part of left lower leg limited to breakdown of skin L97.811 Non-pressure chronic ulcer of other part of right lower leg limited to breakdown of skin I89.0 Lymphedema, not elsewhere classified M30.0 Polyarteritis nodosa L03.119 Cellulitis  of unspecified part of limb Follow-up Appointments ppointment in 1 week. - Any day with Dr. Heber [place] Return A Other: - Begin Antibiotic Bathing/ Shower/ Hygiene May shower with protection but do not get wound dressing(s) wet. - May use cast protectors...you can find those at [facility] or [facility] Edema Control - Lymphedema / SCD / Other Lymphedema Pumps. Use Lymphedema pumps on leg(s) 2-3 times a day for 45-60 minutes. If wearing any wraps or hose, do not remove them. Continue exercising as instructed. - Keep phone line open for [employer] to schedule an appointment to set up pumps at home. Elevate legs to the level of the heart or above for 30 minutes daily and/or when sitting, a frequency of: Avoid standing for long periods of time. Exercise regularly Wound Treatment Wound #10 - Lower Leg Wound Laterality: Left, Circumferential Cleanser: Soap and Water 1 x Per Week/30 Days Discharge Instructions: May shower and wash wound with dial antibacterial soap and water prior to dressing change. Cleanser: Wound Cleanser 1 x Per Week/30 Days Discharge Instructions: Cleanse the wound with wound cleanser prior to applying a clean dressing using gauze sponges, not tissue or cotton balls. Peri-Wound Care: Triamcinolone 15 (g) 1 x Per Week/30 Days Discharge Instructions: Use triamcinolone 15 (g) as directed Peri-Wound Care: Zinc Oxide Ointment 30g tube 1 x  Per Week/30 Days Discharge Instructions: Apply Zinc Oxide to periwound with each dressing change Peri-Wound Care: Sween Lotion (Moisturizing lotion) 1 x Per Week/30 Days Discharge Instructions: Apply moisturizing lotion as directed Prim Dressing: KerraCel Ag Gelling Fiber Dressing, 4x5 in (silver alginate) 1 x Per Week/30 Days ary Discharge Instructions: Apply silver alginate to wound bed as instructed Secondary Dressing: Woven Gauze Sponge, Non-Sterile 4x4 in 1 x Per Week/30 Days Discharge Instructions: Apply over primary dressing as directed. Secondary Dressing: ABD Pad, 8x10 1 x Per Week/30 Days Discharge Instructions: Apply over primary dressing as directed. Secondary Dressing: Zetuvit Plus 4x8 in 1 x Per Week/30 Days Discharge Instructions: Apply over primary dressing as directed. Compression Wrap: ThreePress (3 layer compression wrap) 1 x Per Week/30 Days Discharge Instructions: Apply three layer compression as directed. Wound #11 - Lower Leg Wound Laterality: Right, Posterior Cleanser: Soap and Water 1 x Per Week/30 Days Discharge Instructions: May shower and wash wound with dial antibacterial soap and water prior to dressing change. Cleanser: Wound Cleanser 1 x Per Week/30 Days Discharge Instructions: Cleanse the wound with wound cleanser prior to applying a clean dressing using gauze sponges, not tissue or cotton balls. Peri-Wound Care: Triamcinolone 15 (g) 1 x Per Week/30 Days Discharge Instructions: Use triamcinolone 15 (g) as directed Peri-Wound Care: Zinc Oxide Ointment 30g tube 1 x Per Week/30 Days Discharge Instructions: Apply Zinc Oxide to periwound with each dressing change Peri-Wound Care: Sween Lotion (Moisturizing lotion) 1 x Per Week/30 Days Discharge Instructions: Apply moisturizing lotion as directed Prim Dressing: KerraCel Ag Gelling Fiber Dressing, 4x5 in (silver alginate) 1 x Per Week/30 Days ary Discharge Instructions: Apply silver alginate to wound bed as  instructed Secondary Dressing: Woven Gauze Sponge, Non-Sterile 4x4 in 1 x Per Week/30 Days Discharge Instructions: Apply over primary dressing as directed. Secondary Dressing: ABD Pad, 8x10 1 x Per Week/30 Days Discharge Instructions: Apply over primary dressing as directed. Secondary Dressing: Zetuvit Plus 4x8 in 1 x Per Week/30 Days Discharge Instructions: Apply over primary dressing as directed. Compression Wrap: ThreePress (3 layer compression wrap) 1 x Per Week/30 Days Discharge Instructions: Apply three layer compression as directed. Wound #12 - Lower Leg Wound Laterality:  Right, Anterior, Proximal Cleanser: Soap and Water 1 x Per Week/30 Days Discharge Instructions: May shower and wash wound with dial antibacterial soap and water prior to dressing change. Cleanser: Wound Cleanser 1 x Per Week/30 Days Discharge Instructions: Cleanse the wound with wound cleanser prior to applying a clean dressing using gauze sponges, not tissue or cotton balls. Peri-Wound Care: Triamcinolone 15 (g) 1 x Per Week/30 Days Discharge Instructions: Use triamcinolone 15 (g) as directed Peri-Wound Care: Zinc Oxide Ointment 30g tube 1 x Per Week/30 Days Discharge Instructions: Apply Zinc Oxide to periwound with each dressing change Peri-Wound Care: Sween Lotion (Moisturizing lotion) 1 x Per Week/30 Days Discharge Instructions: Apply moisturizing lotion as directed Prim Dressing: KerraCel Ag Gelling Fiber Dressing, 4x5 in (silver alginate) 1 x Per Week/30 Days ary Discharge Instructions: Apply silver alginate to wound bed as instructed Secondary Dressing: Woven Gauze Sponge, Non-Sterile 4x4 in 1 x Per Week/30 Days Discharge Instructions: Apply over primary dressing as directed. Secondary Dressing: ABD Pad, 8x10 1 x Per Week/30 Days Discharge Instructions: Apply over primary dressing as directed. Secondary Dressing: Zetuvit Plus 4x8 in 1 x Per Week/30 Days Discharge Instructions: Apply over primary dressing as  directed. Compression Wrap: ThreePress (3 layer compression wrap) 1 x Per Week/30 Days Discharge Instructions: Apply three layer compression as directed. Wound #13 - Lower Leg Wound Laterality: Right, Anterior Cleanser: Soap and Water 1 x Per Week/30 Days Discharge Instructions: May shower and wash wound with dial antibacterial soap and water prior to dressing change. Cleanser: Wound Cleanser 1 x Per Week/30 Days Discharge Instructions: Cleanse the wound with wound cleanser prior to applying a clean dressing using gauze sponges, not tissue or cotton balls. Peri-Wound Care: Triamcinolone 15 (g) 1 x Per Week/30 Days Discharge Instructions: Use triamcinolone 15 (g) as directed Peri-Wound Care: Zinc Oxide Ointment 30g tube 1 x Per Week/30 Days Discharge Instructions: Apply Zinc Oxide to periwound with each dressing change Peri-Wound Care: Sween Lotion (Moisturizing lotion) 1 x Per Week/30 Days Discharge Instructions: Apply moisturizing lotion as directed Prim Dressing: KerraCel Ag Gelling Fiber Dressing, 4x5 in (silver alginate) 1 x Per Week/30 Days ary Discharge Instructions: Apply silver alginate to wound bed as instructed Secondary Dressing: Woven Gauze Sponge, Non-Sterile 4x4 in 1 x Per Week/30 Days Discharge Instructions: Apply over primary dressing as directed. Secondary Dressing: ABD Pad, 8x10 1 x Per Week/30 Days Discharge Instructions: Apply over primary dressing as directed. Secondary Dressing: Zetuvit Plus 4x8 in 1 x Per Week/30 Days Discharge Instructions: Apply over primary dressing as directed. Compression Wrap: ThreePress (3 layer compression wrap) 1 x Per Week/30 Days Discharge Instructions: Apply three layer compression as directed. Patient Medications llergies: niacin, sulfamethoxazole, aspirin, Bactrim, benzoin, cephalexin, clindamycin, doxycycline, iohexol, lisinopril, naproxen, Sulfa (Sulfonamide A Antibiotics) Notifications Medication Indication Start  End 10/09/2020 Augmentin DOSE 1 - oral 875 mg-125 mg tablet - 1 tablet twice daily for 5 days Electronic Signature(s) Signed: 10/09/2020 1:24:48 PM By: Kalman Shan DO Previous Signature: 10/09/2020 12:22:08 PM Version By: Kalman Shan DO Previous Signature: 10/09/2020 11:26:48 AM Version By: Kalman Shan DO Previous Signature: 10/09/2020 10:57:45 AM Version By: Lorrin Jackson Entered By: Kalman Shan on 10/09/2020 13:22:13 -------------------------------------------------------------------------------- Problem List Details Patient Name: Date of Service: [name] 10/09/2020 10:15 A M Medical Record Number: [id number] Patient Account Number: [id number] Date of Birth/Sex: Treating RN: [birth year] (50 y.o. [name] Primary Care Provider: [name] Other Clinician: Referring Provider: Treating Provider/Extender: Judeth Porch in Treatment: 84 Active Problems ICD-10 Encounter  Code Description Active Date MDM Diagnosis I87.333 Chronic venous hypertension (idiopathic) with ulcer and inflammation of 06/16/2020 No Yes bilateral lower extremity L97.821 Non-pressure chronic ulcer of other part of left lower leg limited to breakdown 06/16/2020 No Yes of skin L97.811 Non-pressure chronic ulcer of other part of right lower leg limited to breakdown 06/16/2020 No Yes of skin I89.0 Lymphedema, not elsewhere classified 06/16/2020 No Yes M30.0 Polyarteritis nodosa 06/16/2020 No Yes L03.119 Cellulitis of unspecified part of limb 10/09/2020 No Yes Inactive Problems Resolved Problems ICD-10 Code Description Active Date Resolved Date L03.116 Cellulitis of left lower limb 07/07/2020 07/07/2020 Electronic Signature(s) Signed: 10/09/2020 1:24:48 PM By: Kalman Shan DO Previous Signature: 10/09/2020 10:57:06 AM Version By: Lorrin Jackson Entered By: Kalman Shan on 10/09/2020  13:10:32 -------------------------------------------------------------------------------- Progress Note Details Patient Name: Date of Service: [name] 10/09/2020 10:15 A M Medical Record Number: [id number] Patient Account Number: [id number] Date of Birth/Sex: Treating RN: 1970-10-26 (50 y.o. [name] Primary Care Provider: [name] Other Clinician: Referring Provider: Treating Provider/Extender: Judeth Porch in Treatment: 16 Subjective Chief Complaint Information obtained from Patient Bilateral lower extremity wounds, left greater than right History of Present Illness (HPI) Ms. Orengo is a 50 year old female with a past medical history of psoriasis, hyperlipidemia, chronic lower extremity edema as described, cutaneous polyarteritis nodosa, sleep apnea, fibromyalgia, and osteoarthritis of both knees. She was recently followed in our clinic in February for her lower extremity wounds due to chronic venous insufficiency and lymphedema. She was treated with compression wraps and Silver alginate And occasionally with Hydrofera Blue. These closed after a couple months of treatment. During that time she received juxta lite compressions. She was ordered lymphedema pumps to but these had not arrived prior to her wound closure. Today she presents with reopening of her lower extremity wounds. She has still not received her lymphedema pumps. She states she has been using juxta lite compressions daily. She denies acute signs of infection. 5/27; patient presents for follow-up. She was last seen 2 weeks ago. She could not make it to her last week appointment due to feeling sick. She left the wraps on for over a week and now is feeling increased pain to the lower extremities. She took them off 5 days ago and has noticed increased swelling. She has tried using her juxta light compression. Patient History Information obtained from Patient. Family  History Cancer - Father,Maternal Grandparents, Diabetes - Mother, Heart Disease - Father, Hypertension - Mother, Lung Disease - Mother, No family history of Hereditary Spherocytosis. Social History Never smoker, Marital Status - Single, Alcohol Use - Never, Drug Use - No History, Caffeine Use - Rarely. Medical History Hematologic/Lymphatic Patient has history of Lymphedema - lower legs Respiratory Patient has history of Sleep Apnea Cardiovascular Patient has history of Hypertension, Peripheral Venous Disease Musculoskeletal Patient has history of Osteoarthritis Psychiatric Patient has history of Confinement Anxiety Medical A Surgical History Notes nd Cardiovascular Hyperlipidemia Musculoskeletal polyarthritis nodosa, fibromyalgia Objective Constitutional respirations regular, non-labored and within target range for patient.. Vitals Time Taken: 10:38 AM, Height: 62 in, Weight: 304 lbs, BMI: 55.6, Temperature: 97.9 F, Pulse: 81 bpm, Respiratory Rate: 20 breaths/min, Blood Pressure: 132/80 mmHg. Psychiatric pleasant and cooperative. General Notes: Bilateral lower extremities with scattered open wounds limited to skin breakdown. Increased erythema compared to last clinic visit. Patient reports increased pain. Integumentary (Hair, Skin) Wound #10 status is Open. Original cause of wound was Gradually Appeared. The date acquired was: 09/08/2020. The wound has been in treatment  2 weeks. The wound is located on the Left,Circumferential Lower Leg. The wound measures 9cm length x 26cm width x 0.1cm depth; 183.783cm^2 area and 18.378cm^3 volume. There is Fat Layer (Subcutaneous Tissue) exposed. There is no tunneling or undermining noted. There is a large amount of serous drainage noted. The wound margin is indistinct and nonvisible. There is large (67-100%) pink granulation within the wound bed. There is a small (1-33%) amount of necrotic tissue within the wound bed including Adherent  Slough. Wound #11 status is Open. Original cause of wound was Gradually Appeared. The date acquired was: 09/08/2020. The wound has been in treatment 2 weeks. The wound is located on the Right,Posterior Lower Leg. The wound measures 1.8cm length x 1.5cm width x 0.1cm depth; 2.121cm^2 area and 0.212cm^3 volume. There is Fat Layer (Subcutaneous Tissue) exposed. There is no tunneling or undermining noted. There is a medium amount of serous drainage noted. The wound margin is indistinct and nonvisible. There is small (1-33%) red, pink granulation within the wound bed. There is a large (67-100%) amount of necrotic tissue within the wound bed including Adherent Slough. Wound #12 status is Open. Original cause of wound was Gradually Appeared. The date acquired was: 09/08/2020. The wound has been in treatment 2 weeks. The wound is located on the Right,Proximal,Anterior Lower Leg. The wound measures 0cm length x 0cm width x 0cm depth; 0cm^2 area and 0cm^3 volume. There is no tunneling or undermining noted. There is a none present amount of drainage noted. There is no granulation within the wound bed. There is no necrotic tissue within the wound bed. Wound #13 status is Open. Original cause of wound was Gradually Appeared. The date acquired was: 09/08/2020. The wound has been in treatment 2 weeks. The wound is located on the Right,Anterior Lower Leg. The wound measures 1.3cm length x 2cm width x 0.1cm depth; 2.042cm^2 area and 0.204cm^3 volume. There is Fat Layer (Subcutaneous Tissue) exposed. There is no tunneling or undermining noted. There is a large amount of serous drainage noted. The wound margin is flat and intact. There is large (67-100%) red, pink granulation within the wound bed. There is no necrotic tissue within the wound bed. Assessment Active Problems ICD-10 Chronic venous hypertension (idiopathic) with ulcer and inflammation of bilateral lower extremity Non-pressure chronic ulcer of other part of  left lower leg limited to breakdown of skin Non-pressure chronic ulcer of other part of right lower leg limited to breakdown of skin Lymphedema, not elsewhere classified Polyarteritis nodosa Cellulitis of unspecified part of limb Patient's wounds have actually improved in size. She does have increased redness and pain. She has taken Augmentin in the past and we will try a course of this since she did have the wraps on too long And may have a soft tissue infection. She will follow-up next week. We will continue with silver alginate under compression therapy. Procedures Wound #10 Pre-procedure diagnosis of Wound #10 is a Lymphedema located on the Left,Circumferential Lower Leg . There was a Three Layer Compression Therapy Procedure by Lorrin Jackson, RN. Post procedure Diagnosis Wound #10: Same as Pre-Procedure Wound #11 Pre-procedure diagnosis of Wound #11 is a Lymphedema located on the Right,Posterior Lower Leg . There was a Three Layer Compression Therapy Procedure by Lorrin Jackson, RN. Post procedure Diagnosis Wound #11: Same as Pre-Procedure Wound #12 Pre-procedure diagnosis of Wound #12 is a Lymphedema located on the Right,Proximal,Anterior Lower Leg . There was a Three Layer Compression Therapy Procedure by Lorrin Jackson, RN. Post procedure Diagnosis Wound #  12: Same as Pre-Procedure Wound #13 Pre-procedure diagnosis of Wound #13 is a Lymphedema located on the Right,Anterior Lower Leg . There was a Three Layer Compression Therapy Procedure by Lorrin Jackson, RN. Post procedure Diagnosis Wound #13: Same as Pre-Procedure Plan Follow-up Appointments: Return Appointment in 1 week. - Any day with Dr. Heber [place] Other: - Begin Antibiotic Bathing/ Shower/ Hygiene: May shower with protection but do not get wound dressing(s) wet. - May use cast protectors...you can find those at [facility] or [facility] Edema Control - Lymphedema / SCD / Other: Lymphedema Pumps. Use Lymphedema pumps on leg(s) 2-3  times a day for 45-60 minutes. If wearing any wraps or hose, do not remove them. Continue exercising as instructed. - Keep phone line open for [employer] to schedule an appointment to set up pumps at home. Elevate legs to the level of the heart or above for 30 minutes daily and/or when sitting, a frequency of: Avoid standing for long periods of time. Exercise regularly The following medication(s) was prescribed: Augmentin oral 875 mg-125 mg tablet 1 1 tablet twice daily for 5 days starting 10/09/2020 WOUND #10: - Lower Leg Wound Laterality: Left, Circumferential Cleanser: Soap and Water 1 x Per Week/30 Days Discharge Instructions: May shower and wash wound with dial antibacterial soap and water prior to dressing change. Cleanser: Wound Cleanser 1 x Per Week/30 Days Discharge Instructions: Cleanse the wound with wound cleanser prior to applying a clean dressing using gauze sponges, not tissue or cotton balls. Peri-Wound Care: Triamcinolone 15 (g) 1 x Per Week/30 Days Discharge Instructions: Use triamcinolone 15 (g) as directed Peri-Wound Care: Zinc Oxide Ointment 30g tube 1 x Per Week/30 Days Discharge Instructions: Apply Zinc Oxide to periwound with each dressing change Peri-Wound Care: Sween Lotion (Moisturizing lotion) 1 x Per Week/30 Days Discharge Instructions: Apply moisturizing lotion as directed Prim Dressing: KerraCel Ag Gelling Fiber Dressing, 4x5 in (silver alginate) 1 x Per Week/30 Days ary Discharge Instructions: Apply silver alginate to wound bed as instructed Secondary Dressing: Woven Gauze Sponge, Non-Sterile 4x4 in 1 x Per Week/30 Days Discharge Instructions: Apply over primary dressing as directed. Secondary Dressing: ABD Pad, 8x10 1 x Per Week/30 Days Discharge Instructions: Apply over primary dressing as directed. Secondary Dressing: Zetuvit Plus 4x8 in 1 x Per Week/30 Days Discharge Instructions: Apply over primary dressing as directed. Com pression Wrap: ThreePress (3 layer  compression wrap) 1 x Per Week/30 Days Discharge Instructions: Apply three layer compression as directed. WOUND #11: - Lower Leg Wound Laterality: Right, Posterior Cleanser: Soap and Water 1 x Per Week/30 Days Discharge Instructions: May shower and wash wound with dial antibacterial soap and water prior to dressing change. Cleanser: Wound Cleanser 1 x Per Week/30 Days Discharge Instructions: Cleanse the wound with wound cleanser prior to applying a clean dressing using gauze sponges, not tissue or cotton balls. Peri-Wound Care: Triamcinolone 15 (g) 1 x Per Week/30 Days Discharge Instructions: Use triamcinolone 15 (g) as directed Peri-Wound Care: Zinc Oxide Ointment 30g tube 1 x Per Week/30 Days Discharge Instructions: Apply Zinc Oxide to periwound with each dressing change Peri-Wound Care: Sween Lotion (Moisturizing lotion) 1 x Per Week/30 Days Discharge Instructions: Apply moisturizing lotion as directed Prim Dressing: KerraCel Ag Gelling Fiber Dressing, 4x5 in (silver alginate) 1 x Per Week/30 Days ary Discharge Instructions: Apply silver alginate to wound bed as instructed Secondary Dressing: Woven Gauze Sponge, Non-Sterile 4x4 in 1 x Per Week/30 Days Discharge Instructions: Apply over primary dressing as directed. Secondary Dressing: ABD Pad, 8x10 1 x Per  Week/30 Days Discharge Instructions: Apply over primary dressing as directed. Secondary Dressing: Zetuvit Plus 4x8 in 1 x Per Week/30 Days Discharge Instructions: Apply over primary dressing as directed. Com pression Wrap: ThreePress (3 layer compression wrap) 1 x Per Week/30 Days Discharge Instructions: Apply three layer compression as directed. WOUND #12: - Lower Leg Wound Laterality: Right, Anterior, Proximal Cleanser: Soap and Water 1 x Per Week/30 Days Discharge Instructions: May shower and wash wound with dial antibacterial soap and water prior to dressing change. Cleanser: Wound Cleanser 1 x Per Week/30 Days Discharge  Instructions: Cleanse the wound with wound cleanser prior to applying a clean dressing using gauze sponges, not tissue or cotton balls. Peri-Wound Care: Triamcinolone 15 (g) 1 x Per Week/30 Days Discharge Instructions: Use triamcinolone 15 (g) as directed Peri-Wound Care: Zinc Oxide Ointment 30g tube 1 x Per Week/30 Days Discharge Instructions: Apply Zinc Oxide to periwound with each dressing change Peri-Wound Care: Sween Lotion (Moisturizing lotion) 1 x Per Week/30 Days Discharge Instructions: Apply moisturizing lotion as directed Prim Dressing: KerraCel Ag Gelling Fiber Dressing, 4x5 in (silver alginate) 1 x Per Week/30 Days ary Discharge Instructions: Apply silver alginate to wound bed as instructed Secondary Dressing: Woven Gauze Sponge, Non-Sterile 4x4 in 1 x Per Week/30 Days Discharge Instructions: Apply over primary dressing as directed. Secondary Dressing: ABD Pad, 8x10 1 x Per Week/30 Days Discharge Instructions: Apply over primary dressing as directed. Secondary Dressing: Zetuvit Plus 4x8 in 1 x Per Week/30 Days Discharge Instructions: Apply over primary dressing as directed. Com pression Wrap: ThreePress (3 layer compression wrap) 1 x Per Week/30 Days Discharge Instructions: Apply three layer compression as directed. WOUND #13: - Lower Leg Wound Laterality: Right, Anterior Cleanser: Soap and Water 1 x Per Week/30 Days Discharge Instructions: May shower and wash wound with dial antibacterial soap and water prior to dressing change. Cleanser: Wound Cleanser 1 x Per Week/30 Days Discharge Instructions: Cleanse the wound with wound cleanser prior to applying a clean dressing using gauze sponges, not tissue or cotton balls. Peri-Wound Care: Triamcinolone 15 (g) 1 x Per Week/30 Days Discharge Instructions: Use triamcinolone 15 (g) as directed Peri-Wound Care: Zinc Oxide Ointment 30g tube 1 x Per Week/30 Days Discharge Instructions: Apply Zinc Oxide to periwound with each dressing  change Peri-Wound Care: Sween Lotion (Moisturizing lotion) 1 x Per Week/30 Days Discharge Instructions: Apply moisturizing lotion as directed Prim Dressing: KerraCel Ag Gelling Fiber Dressing, 4x5 in (silver alginate) 1 x Per Week/30 Days ary Discharge Instructions: Apply silver alginate to wound bed as instructed Secondary Dressing: Woven Gauze Sponge, Non-Sterile 4x4 in 1 x Per Week/30 Days Discharge Instructions: Apply over primary dressing as directed. Secondary Dressing: ABD Pad, 8x10 1 x Per Week/30 Days Discharge Instructions: Apply over primary dressing as directed. Secondary Dressing: Zetuvit Plus 4x8 in 1 x Per Week/30 Days Discharge Instructions: Apply over primary dressing as directed. Com pression Wrap: ThreePress (3 layer compression wrap) 1 x Per Week/30 Days Discharge Instructions: Apply three layer compression as directed. 1. Augmentin 2. Silver alginate under 3 layer compression 3. Follow-up in a week Electronic Signature(s) Signed: 10/09/2020 1:24:48 PM By: Kalman Shan DO Entered By: Kalman Shan on 10/09/2020 13:23:43 -------------------------------------------------------------------------------- HxROS Details Patient Name: Date of Service: [name] 10/09/2020 10:15 A M Medical Record Number: [id number] Patient Account Number: [id number] Date of Birth/Sex: Treating RN: [birth year] (50 y.o. [name] Primary Care Provider: [name] Other Clinician: Referring Provider: Treating Provider/Extender: Judeth Porch in Treatment: 16 Information Obtained  From Patient Hematologic/Lymphatic Medical History: Positive for: Lymphedema - lower legs Respiratory Medical History: Positive for: Sleep Apnea Cardiovascular Medical History: Positive for: Hypertension; Peripheral Venous Disease Past Medical History Notes: Hyperlipidemia Musculoskeletal Medical History: Positive for: Osteoarthritis Past Medical  History Notes: polyarthritis nodosa, fibromyalgia Psychiatric Medical History: Positive for: Confinement Anxiety Immunizations Pneumococcal Vaccine: Received Pneumococcal Vaccination: Yes Implantable Devices None Family and Social History Cancer: Yes - Father,Maternal Grandparents; Diabetes: Yes - Mother; Heart Disease: Yes - Father; Hereditary Spherocytosis: No; Hypertension: Yes - Mother; Lung Disease: Yes - Mother; Never smoker; Marital Status - Single; Alcohol Use: Never; Drug Use: No History; Caffeine Use: Rarely; Financial Concerns: No; Food, Clothing or Shelter Needs: No; Support System Lacking: No; Transportation Concerns: No Electronic Signature(s) Signed: 10/09/2020 1:24:48 PM By: Kalman Shan DO Signed: 10/09/2020 1:31:18 PM By: Lorrin Jackson Entered By: Kalman Shan on 10/09/2020 13:21:44 -------------------------------------------------------------------------------- SuperBill Details Patient Name: Date of Service: [name]. 10/09/2020 Medical Record Number: [id number] Patient Account Number: [id number] Date of Birth/Sex: Treating RN: Jun 05, 1970 (50 y.o. 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 ICD-10 Diagnosis Description L97.821 Non-pressure chronic ulcer of other part of left lower leg limited to breakdown of skin L97.811  Non-pressure chronic ulcer of other part of right lower leg limited to breakdown of skin I89.0 Lymphedema, not elsewhere classified Modifier: Quantity: 1 Physician Procedures : CPT4 Code Description Modifier [id number] - WC PHYS LEVEL 3 - EST PT ICD-10 Diagnosis Description I87.333 Chronic venous hypertension (idiopathic) with ulcer and inflammation of bilateral lower extremity L03.119 Cellulitis of unspecified part of  limb L97.821 Non-pressure chronic ulcer of other part of left lower leg limited to breakdown of skin L97.811 Non-pressure chronic ulcer of other part of right lower leg limited to breakdown of skin Quantity: 1 Electronic Signature(s) Signed: 10/09/2020 1:24:48 PM By: Kalman Shan DO Entered By: Kalman Shan on 10/09/2020 13:24:12

## 2020-10-14 ENCOUNTER — Encounter: Payer: Self-pay | Admitting: Family Medicine

## 2020-10-15 ENCOUNTER — Encounter: Payer: Self-pay | Admitting: Family Medicine

## 2020-10-15 ENCOUNTER — Encounter (HOSPITAL_BASED_OUTPATIENT_CLINIC_OR_DEPARTMENT_OTHER): Payer: Medicare Other | Attending: Internal Medicine | Admitting: Internal Medicine

## 2020-10-15 DIAGNOSIS — I872 Venous insufficiency (chronic) (peripheral): Secondary | ICD-10-CM | POA: Insufficient documentation

## 2020-10-15 DIAGNOSIS — L03115 Cellulitis of right lower limb: Secondary | ICD-10-CM | POA: Insufficient documentation

## 2020-10-15 DIAGNOSIS — I87333 Chronic venous hypertension (idiopathic) with ulcer and inflammation of bilateral lower extremity: Secondary | ICD-10-CM | POA: Insufficient documentation

## 2020-10-15 DIAGNOSIS — I890 Lymphedema, not elsewhere classified: Secondary | ICD-10-CM | POA: Insufficient documentation

## 2020-10-15 DIAGNOSIS — L97811 Non-pressure chronic ulcer of other part of right lower leg limited to breakdown of skin: Secondary | ICD-10-CM | POA: Insufficient documentation

## 2020-10-15 DIAGNOSIS — L97821 Non-pressure chronic ulcer of other part of left lower leg limited to breakdown of skin: Secondary | ICD-10-CM | POA: Insufficient documentation

## 2020-10-15 NOTE — Unmapped (Signed)
Reason for call:     Pts pharmacy stated that her insurance will cover Voltaren if a RX can be sent to them. Please send to [facility]    Last ov: 10/08/2020  Next ov: 03/11/2021

## 2020-10-16 MED ORDER — DICLOFENAC 1 % TOPICAL GEL
Freq: Four times a day (QID) | TOPICAL | 5 refills | 19 days | Status: CP
Start: 2020-10-16 — End: ?

## 2020-10-16 NOTE — Unmapped (Signed)
Rx for voltaren gel sent to pharmacy per pt request.

## 2020-10-16 NOTE — Unmapped (Signed)
Addended by: Philippa Sicks on: 10/16/2020 09:38 AM     Modules accepted: Orders

## 2020-10-19 ENCOUNTER — Encounter: Admit: 2020-10-19 | Discharge: 2020-11-17 | Payer: MEDICARE

## 2020-10-19 ENCOUNTER — Ambulatory Visit: Admit: 2020-10-19 | Discharge: 2020-11-17 | Payer: MEDICARE

## 2020-10-19 ENCOUNTER — Non-Acute Institutional Stay: Admit: 2020-10-19 | Discharge: 2020-11-17 | Payer: MEDICARE

## 2020-10-19 DIAGNOSIS — I890 Lymphedema, not elsewhere classified: Principal | ICD-10-CM

## 2020-10-19 DIAGNOSIS — L409 Psoriasis, unspecified: Principal | ICD-10-CM

## 2020-10-19 DIAGNOSIS — L52 Erythema nodosum: Principal | ICD-10-CM

## 2020-10-19 DIAGNOSIS — M79604 Pain in right leg: Secondary | ICD-10-CM | POA: Diagnosis not present

## 2020-10-19 DIAGNOSIS — M79605 Pain in left leg: Secondary | ICD-10-CM | POA: Diagnosis not present

## 2020-10-19 DIAGNOSIS — R2689 Other abnormalities of gait and mobility: Secondary | ICD-10-CM | POA: Diagnosis not present

## 2020-10-19 NOTE — Unmapped (Signed)
[facility] OCCUPATIONAL THERAPY [place]  OUTPATIENT OCCUPATIONAL THERAPY  10/19/2020  Note Type: Evaluation       Patient Name: Autumn Mcdonald  Date of Birth:[birth year]  Diagnosis:   Encounter Diagnoses   Name Primary?   ??? Erythema induratum    ??? Lymphedema Yes   ??? Psoriasis      Visit 1    Referring MD:  Ethlyn Gallery     Date OT Care Plan Established or Reviewed-10/19/20    Plan of Care Effective Date: 10/19/2020 - 11/18/2020    Assessment/Plan:    Assessment details:       Pt is a 50 year old female with a medical and treatment dx of lymphedema.. Pt has previously had wounds on BLE that have now healed.   Pt presents with body system impairments including edema, skin integrity that impair her general health, ambulation, ADL, and IADL functions. She would benefit from outpatient occupational therapy to address lymphedema in order to reach highest level of function.   Impairments: fall risk, increased edema, uncontrolled swelling, impaired ADLs, decreased skin integrity and decreased mobility  ??  Pt also demonstrates a need for physical therapy due to high risk of falls, poor balance and decreased mobility.   Impairments: fall risk, impaired balance, increased edema, pain, decreased endurance, decreased mobility, impaired ADLs and decreased skin integrity    Personal Factors/Comorbidities: 3    Examination of Body Systems: 3 elements    Clinical Presentation: evolving  Clinical Decision Making: moderate  Prognosis: good prognosis  Positive Prognosis Rationale: age and motivated for treatment.  Negative Prognosis Rationale: insight, chronicity of condition, severity of symptoms, body habitus and balance.    Therapy Goals  Goals:       Goals:      Short Term Goals:  1. Pt's circumference of BLE will be reduced by 1cm at all measurements within  2 weeks at feet and knees.     2. Pt's skin integrity of BLE will improve at ankles in 2 weeks to decrease  potential for infection.    3. Pt will be educated on self MLD and recall it with 100% accuracy to perform  in the home in 2 weeks.    4. Pt's skin texture of BLE will improve to a soft, supple state within 2  Weeks.    Long Term Goals:  1. Pt's circumference of bilateral lower extremities will be reduced by 2cm at  all appropriate measurements within 4 weeks to increase ADL function.    2. Pt will recall all precautions to prevent lymphedema exacerbation at 100% in  4 weeks.    3. Pt will be fitted for compression garments to address BLE edema with  independent donning in 4 weeks.    4. Pt will be independent with home management of lymphedema in 4 weeks.  Plan  Therapy options: will be seen for skilled occupational therapy services  Planned therapy interventions: Compression Bandaging, Compression Pump, Education - Patient, Home Exercise Program, Garment Measurement, Therapeutic Exercises, Therapeutic Activities, Manual Therapy and Manual Lymph Drainage  DME Equipment: compression pump, lymphedema bandages and compression garments.  Frequency: 2x week  Duration in weeks: 4 weeks  Education provided to: patient.  Education provided: Importance of Therapy  Education results: needs reinforcement.    Next visit plan:       Initiate MLD  Total Session Time: 50  Treatment rendered today:       Initial evaluation  Subjective:   History of Present Illness  Date of Onset: 05/17/2003    Date of Evaluation: 10/19/2020    Reason for Referral/Chief Complaint:       Pt reports that her legs have been swelling since 2005. She was sent to rumatologist in 2014/2015 with an appropriate dx then and it has progressively gotten worse. She has also been getting treatment for psoriasis. Pt has been seen at wound center in [place] for the past 17 weeks. She has not been released by them yet. Pt desires to wear appropriate shoes and to have a better life. She is seeking outpatient lymphedema treatment to address BLE lymphedema.   Subjective:     Pt reports that her legs cause her pain all the time, the pain varies. I am only 50 years old.  I should be able to be able to go out and do things.  I should be able to have hobbies and should be able to live my life.  Quality of life: fair    Pain  Current pain rating: 8  At best pain rating: 2  At worst pain rating: 10  Location: BLE  Quality: dull ache, heavy, throbbing, tight, aching and intermittent  Relieving factors: elevation  Aggravating factors: walking  Pain Related Behaviors: crying  Progression: worsening  Red flags: night pain and disturbed sleep.    Current functional status: limited walking tolerance, unsteady gait, disturbed sleep, leisure activities, limited standing tolerance, use of assistive device, limited recreation, limited household activities and limited exercise    Precautions and Equipment  Precautions: Fall risk and Cancer history  Equipment Currently Used: Single point cane, Metallurgist, Training and development officer  Social Support  Lives in: two story house  Lives with: family members  Hand dominance: right  Communication Preference: verbal  Barriers to Learning: No Barriers  Work/School: on disability    Diagnostic Tests    Diagnostic Test Comments:       none    Treatments  No previous or current treatments      Patient Goals  Patient goals for therapy: decrease assistance with ADLs, decreased edema, decreased pain, improve fit of clothing, improve fit of shoes, improve independence with dressing, improved ambulation, improved sleep, independence with ADLs/IADLs, obtain compression garment and return to sport/leisure activities  Patient goal: obatin pneumatic pump      Objective:   Lymphedema:   Treatment:     Lymph node removal: No      Chemotherapy: No      Radiation therapy: No      Previous lymphedema treatment: No      Reoccurrence?: No    Current Management:     Daytime:  Nothing    Nighttime:  Nothing  Contraindications:     Contraindications:  None  Objective:     Location of swelling:  Right, left, ankle, toes, knee, thigh, groin and foot    Fold thickness:  Moderate    Fibrosis: No      Skin assessment:  Discoloration, edema and dry skin    Skin assessment comments:  Skin is noted to be dark pink with several small wounds that are not currently weeping.     Incision:  N/A    Color:  Pink    Temperature:  Normal    Hair growth:  Reduced    Stemmer's sign:  +    Wound: Yes      Location:  Right, left and calf    Description:  Closed  Posture/Observations:     Sitting:  Rounded shoulders    Standing:  Forward flexed posture and wide BOS  ROM and Strength:     ROM and Strength findings:  BLE WFL  Tests:     Lower Extremity Functional Scale Score (out of 80):  13  Lymphedema additional information:  RLE:  MTP: 24.5cm  Heel: 37cm  Ankle: 31cm  10cm: 33cm  20cm: 49cm  Knee: 56.3cm  10cm: 84.2cm??  20cm: 92.8cm    LLE:  MTP: 25.6cm  Heel: 37.2cm  Ankle: 44cm  10cm: 36.3cm  20cm: 54.5cm  Knee: 59cm  10cm: 90.2cm  20cm: 100cm                               I attest that I have reviewed the above information.  Signed: Melvern Banker, OT  10/19/2020 4:12 PM

## 2020-10-20 ENCOUNTER — Encounter (HOSPITAL_BASED_OUTPATIENT_CLINIC_OR_DEPARTMENT_OTHER): Payer: Medicare Other | Admitting: Internal Medicine

## 2020-10-21 ENCOUNTER — Encounter (HOSPITAL_BASED_OUTPATIENT_CLINIC_OR_DEPARTMENT_OTHER): Payer: Medicare Other | Admitting: Internal Medicine

## 2020-10-21 ENCOUNTER — Other Ambulatory Visit: Payer: Self-pay

## 2020-10-21 ENCOUNTER — Other Ambulatory Visit: Payer: Self-pay | Admitting: Family Medicine

## 2020-10-21 DIAGNOSIS — L97821 Non-pressure chronic ulcer of other part of left lower leg limited to breakdown of skin: Secondary | ICD-10-CM | POA: Diagnosis not present

## 2020-10-21 DIAGNOSIS — L97811 Non-pressure chronic ulcer of other part of right lower leg limited to breakdown of skin: Secondary | ICD-10-CM | POA: Diagnosis not present

## 2020-10-21 DIAGNOSIS — I872 Venous insufficiency (chronic) (peripheral): Secondary | ICD-10-CM | POA: Diagnosis not present

## 2020-10-21 DIAGNOSIS — L03115 Cellulitis of right lower limb: Secondary | ICD-10-CM | POA: Diagnosis not present

## 2020-10-21 DIAGNOSIS — I890 Lymphedema, not elsewhere classified: Secondary | ICD-10-CM | POA: Diagnosis not present

## 2020-10-21 DIAGNOSIS — I87333 Chronic venous hypertension (idiopathic) with ulcer and inflammation of bilateral lower extremity: Secondary | ICD-10-CM | POA: Diagnosis not present

## 2020-10-21 NOTE — Telephone Encounter (Signed)
LFD 09/24/20 #90 with no refills LOV 10/05/20 NOV 03/04/21

## 2020-10-21 NOTE — Progress Notes (Signed)
[name], [name] ([id number]) Visit Report for 10/21/2020 Chief Complaint Document Details Patient Name: Date of Service: Erica ATHANASIA, STANWOOD 10/21/2020 9:00 A M Medical Record Number: [id number] Patient Account Number: [id number] Date of Birth/Sex: Treating RN: [birth year] (50 y.o. [name] Primary Care Provider: [name] Other Clinician: Referring Provider: Treating Provider/Extender: Judeth Porch in Treatment: 18 Information Obtained from: Patient Chief Complaint Bilateral lower extremity wounds, left greater than right Electronic Signature(s) Signed: 10/21/2020 11:47:55 AM By: Kalman Shan DO Entered By: Kalman Shan on 10/21/2020 11:42:35 -------------------------------------------------------------------------------- HPI Details Patient Name: Date of Service: [name], Erica K. 10/21/2020 9:00 A M Medical Record Number: [id number] Patient Account Number: [id number] Date of Birth/Sex: Treating RN: [birth year] (50 y.o. [name] Primary Care Provider: [name] Other Clinician: Referring Provider: Treating Provider/Extender: Judeth Porch in Treatment: 18 History of Present Illness HPI Description: Ms. Anker is a 51 year old female with a past medical history of psoriasis, hyperlipidemia, chronic lower extremity edema as described, cutaneous polyarteritis nodosa, sleep apnea, fibromyalgia, and osteoarthritis of both knees. She was recently followed in our clinic in February for her lower extremity wounds due to chronic venous insufficiency and lymphedema. She was treated with compression wraps and Silver alginate And occasionally with Hydrofera Blue. These closed after a couple months of treatment. During that time she received juxta lite compressions. She was ordered lymphedema pumps to but these had not arrived prior to her wound closure. Today she presents with reopening of her lower extremity  wounds. She has still not received her lymphedema pumps. She states she has been using juxta lite compressions daily. She denies acute signs of infection. 5/27; patient presents for follow-up. She was last seen 2 weeks ago. She could not make it to her last week appointment due to feeling sick. She left the wraps on for over a week and now is feeling increased pain to the lower extremities. She took them off 5 days ago and has noticed increased swelling. She has tried using her juxta light compression. 6/8; patient presents for 1 week follow-up. She finished Keflex and states that the pain in her legs has resolved and she feels much better. She has tolerated 3 layer compression with silver alginate. She denies signs of infection. Electronic Signature(s) Signed: 10/21/2020 11:47:55 AM By: Kalman Shan DO Entered By: Kalman Shan on 10/21/2020 11:44:53 -------------------------------------------------------------------------------- Physical Exam Details Patient Name: Date of Service: Erica Devoid K. 10/21/2020 9:00 A M Medical Record Number: [id number] Patient Account Number: [id number] Date of Birth/Sex: Treating RN: 10-29-1970 (50 y.o. [name] Primary Care Provider: [name] Other Clinician: Referring Provider: Treating Provider/Extender: Judeth Porch in Treatment: 18 Constitutional respirations regular, non-labored and within target range for patient.. Cardiovascular 2+ dorsalis pedis/posterior tibialis pulses. Psychiatric pleasant and cooperative. Notes Bilateral lower extremities with scattered open wounds limited to skin breakdown. No signs of infection. Electronic Signature(s) Signed: 10/21/2020 11:47:55 AM By: Kalman Shan DO Entered By: Kalman Shan on 10/21/2020 11:45:31 -------------------------------------------------------------------------------- Physician Orders Details Patient Name: Date of Service: Erica Devoid  K. 10/21/2020 9:00 A M Medical Record Number: [id number] Patient Account Number: [id number] Date of Birth/Sex: Treating RN: 06-30-1970 (50 y.o. [name] Primary Care Provider: [name] Other Clinician: Referring Provider: Treating Provider/Extender: Judeth Porch in Treatment: 19 Verbal / Phone Orders: No Diagnosis Coding ICD-10 Coding Code Description [phone] Chronic venous hypertension (idiopathic) with ulcer and inflammation of bilateral lower extremity L97.821 Non-pressure chronic ulcer  of other part of left lower leg limited to breakdown of skin L97.811 Non-pressure chronic ulcer of other part of right lower leg limited to breakdown of skin I89.0 Lymphedema, not elsewhere classified M30.0 Polyarteritis nodosa Follow-up Appointments ppointment in 2 weeks. - with Dr. Heber Sharon Return A Nurse Visit: - 1 week for rewrap Bathing/ Shower/ Hygiene May shower with protection but do not get wound dressing(s) wet. - May use cast protectors...you can find those at [facility] or [facility] Edema Control - Lymphedema / SCD / Other Lymphedema Pumps. Use Lymphedema pumps on leg(s) 2-3 times a day for 45-60 minutes. If wearing any wraps or hose, do not remove them. Continue exercising as instructed. - Keep phone line open for [employer] to schedule an appointment to set up pumps at home. Elevate legs to the level of the heart or above for 30 minutes daily and/or when sitting, a frequency of: Avoid standing for long periods of time. Exercise regularly Wound Treatment Wound #10 - Lower Leg Wound Laterality: Left, Circumferential Cleanser: Soap and Water 1 x Per Week/30 Days Discharge Instructions: May shower and wash wound with dial antibacterial soap and water prior to dressing change. Cleanser: Wound Cleanser 1 x Per Week/30 Days Discharge Instructions: Cleanse the wound with wound cleanser prior to applying a clean dressing using gauze sponges, not tissue or  cotton balls. Peri-Wound Care: Triamcinolone 15 (g) 1 x Per Week/30 Days Discharge Instructions: Use triamcinolone 15 (g) as directed Peri-Wound Care: Sween Lotion (Moisturizing lotion) 1 x Per Week/30 Days Discharge Instructions: Apply moisturizing lotion as directed Prim Dressing: KerraCel Ag Gelling Fiber Dressing, 4x5 in (silver alginate) 1 x Per Week/30 Days ary Discharge Instructions: Apply silver alginate to wound bed as instructed Secondary Dressing: Woven Gauze Sponge, Non-Sterile 4x4 in 1 x Per Week/30 Days Discharge Instructions: Apply over primary dressing as directed. Secondary Dressing: ABD Pad, 8x10 1 x Per Week/30 Days Discharge Instructions: Apply over primary dressing as directed. Secondary Dressing: Zetuvit Plus 4x8 in 1 x Per Week/30 Days Discharge Instructions: Apply over primary dressing as directed. Compression Wrap: ThreePress (3 layer compression wrap) 1 x Per Week/30 Days Discharge Instructions: Apply three layer compression as directed. Wound #11 - Lower Leg Wound Laterality: Right, Posterior Cleanser: Soap and Water 1 x Per Week/30 Days Discharge Instructions: May shower and wash wound with dial antibacterial soap and water prior to dressing change. Cleanser: Wound Cleanser 1 x Per Week/30 Days Discharge Instructions: Cleanse the wound with wound cleanser prior to applying a clean dressing using gauze sponges, not tissue or cotton balls. Peri-Wound Care: Triamcinolone 15 (g) 1 x Per Week/30 Days Discharge Instructions: Use triamcinolone 15 (g) as directed Peri-Wound Care: Sween Lotion (Moisturizing lotion) 1 x Per Week/30 Days Discharge Instructions: Apply moisturizing lotion as directed Prim Dressing: KerraCel Ag Gelling Fiber Dressing, 4x5 in (silver alginate) 1 x Per Week/30 Days ary Discharge Instructions: Apply silver alginate to wound bed as instructed Secondary Dressing: Woven Gauze Sponge, Non-Sterile 4x4 in 1 x Per Week/30 Days Discharge Instructions:  Apply over primary dressing as directed. Compression Wrap: ThreePress (3 layer compression wrap) 1 x Per Week/30 Days Discharge Instructions: Apply three layer compression as directed. Wound #12 - Lower Leg Wound Laterality: Right, Anterior, Proximal Cleanser: Soap and Water 1 x Per Week/30 Days Discharge Instructions: May shower and wash wound with dial antibacterial soap and water prior to dressing change. Cleanser: Wound Cleanser 1 x Per Week/30 Days Discharge Instructions: Cleanse the wound with wound cleanser prior to applying a clean dressing  using gauze sponges, not tissue or cotton balls. Peri-Wound Care: Triamcinolone 15 (g) 1 x Per Week/30 Days Discharge Instructions: Use triamcinolone 15 (g) as directed Peri-Wound Care: Sween Lotion (Moisturizing lotion) 1 x Per Week/30 Days Discharge Instructions: Apply moisturizing lotion as directed Prim Dressing: KerraCel Ag Gelling Fiber Dressing, 4x5 in (silver alginate) 1 x Per Week/30 Days ary Discharge Instructions: Apply silver alginate to wound bed as instructed Secondary Dressing: Woven Gauze Sponge, Non-Sterile 4x4 in 1 x Per Week/30 Days Discharge Instructions: Apply over primary dressing as directed. Secondary Dressing: ABD Pad, 8x10 1 x Per Week/30 Days Discharge Instructions: Apply over primary dressing as directed. Secondary Dressing: Zetuvit Plus 4x8 in 1 x Per Week/30 Days Discharge Instructions: Apply over primary dressing as directed. Compression Wrap: ThreePress (3 layer compression wrap) 1 x Per Week/30 Days Discharge Instructions: Apply three layer compression as directed. Wound #13 - Lower Leg Wound Laterality: Right, Anterior Cleanser: Soap and Water 1 x Per Week/30 Days Discharge Instructions: May shower and wash wound with dial antibacterial soap and water prior to dressing change. Cleanser: Wound Cleanser 1 x Per Week/30 Days Discharge Instructions: Cleanse the wound with wound cleanser prior to applying a clean  dressing using gauze sponges, not tissue or cotton balls. Peri-Wound Care: Triamcinolone 15 (g) 1 x Per Week/30 Days Discharge Instructions: Use triamcinolone 15 (g) as directed Peri-Wound Care: Sween Lotion (Moisturizing lotion) 1 x Per Week/30 Days Discharge Instructions: Apply moisturizing lotion as directed Prim Dressing: KerraCel Ag Gelling Fiber Dressing, 4x5 in (silver alginate) 1 x Per Week/30 Days ary Discharge Instructions: Apply silver alginate to wound bed as instructed Secondary Dressing: Woven Gauze Sponge, Non-Sterile 4x4 in 1 x Per Week/30 Days Discharge Instructions: Apply over primary dressing as directed. Secondary Dressing: ABD Pad, 8x10 1 x Per Week/30 Days Discharge Instructions: Apply over primary dressing as directed. Secondary Dressing: Zetuvit Plus 4x8 in 1 x Per Week/30 Days Discharge Instructions: Apply over primary dressing as directed. Compression Wrap: ThreePress (3 layer compression wrap) 1 x Per Week/30 Days Discharge Instructions: Apply three layer compression as directed. Electronic Signature(s) Signed: 10/21/2020 11:47:55 AM By: Kalman Shan DO Entered By: Kalman Shan on 10/21/2020 11:45:52 -------------------------------------------------------------------------------- Problem List Details Patient Name: Date of Service: Erica Devoid K. 10/21/2020 9:00 A M Medical Record Number: [id number] Patient Account Number: [id number] Date of Birth/Sex: Treating RN: 1970/08/24 (50 y.o. Martyn Malay, [name] Primary Care Provider: [name] Other Clinician: Referring Provider: Treating Provider/Extender: Judeth Porch in Treatment: 18 Active Problems ICD-10 Encounter Code Description Active Date MDM Diagnosis I87.333 Chronic venous hypertension (idiopathic) with ulcer and inflammation of 06/16/2020 No Yes bilateral lower extremity L97.821 Non-pressure chronic ulcer of other part of left lower leg limited to breakdown  06/16/2020 No Yes of skin L97.811 Non-pressure chronic ulcer of other part of right lower leg limited to breakdown 06/16/2020 No Yes of skin I89.0 Lymphedema, not elsewhere classified 06/16/2020 No Yes M30.0 Polyarteritis nodosa 06/16/2020 No Yes Inactive Problems Resolved Problems ICD-10 Code Description Active Date Resolved Date L03.116 Cellulitis of left lower limb 07/07/2020 07/07/2020 L03.119 Cellulitis of unspecified part of limb 10/09/2020 10/09/2020 Electronic Signature(s) Signed: 10/21/2020 11:47:55 AM By: Kalman Shan DO Entered By: Kalman Shan on 10/21/2020 11:42:21 -------------------------------------------------------------------------------- Progress Note Details Patient Name: Date of Service: Erica Orson Gear K. 10/21/2020 9:00 A M Medical Record Number: [id number] Patient Account Number: [id number] Date of Birth/Sex: Treating RN: 20-Oct-1970 (50 y.o. [name] Primary Care Provider: [name] Other Clinician: Referring Provider: Treating Provider/Extender: Heber [place]  Silvestre Gunner in Treatment: 18 Subjective Chief Complaint Information obtained from Patient Bilateral lower extremity wounds, left greater than right History of Present Illness (HPI) Ms. Ballowe is a 50 year old female with a past medical history of psoriasis, hyperlipidemia, chronic lower extremity edema as described, cutaneous polyarteritis nodosa, sleep apnea, fibromyalgia, and osteoarthritis of both knees. She was recently followed in our clinic in February for her lower extremity wounds due to chronic venous insufficiency and lymphedema. She was treated with compression wraps and Silver alginate And occasionally with Hydrofera Blue. These closed after a couple months of treatment. During that time she received juxta lite compressions. She was ordered lymphedema pumps to but these had not arrived prior to her wound closure. Today she presents with reopening of her lower extremity  wounds. She has still not received her lymphedema pumps. She states she has been using juxta lite compressions daily. She denies acute signs of infection. 5/27; patient presents for follow-up. She was last seen 2 weeks ago. She could not make it to her last week appointment due to feeling sick. She left the wraps on for over a week and now is feeling increased pain to the lower extremities. She took them off 5 days ago and has noticed increased swelling. She has tried using her juxta light compression. 6/8; patient presents for 1 week follow-up. She finished Keflex and states that the pain in her legs has resolved and she feels much better. She has tolerated 3 layer compression with silver alginate. She denies signs of infection. Patient History Information obtained from Patient. Family History Cancer - Father,Maternal Grandparents, Diabetes - Mother, Heart Disease - Father, Hypertension - Mother, Lung Disease - Mother, No family history of Hereditary Spherocytosis. Social History Never smoker, Marital Status - Single, Alcohol Use - Never, Drug Use - No History, Caffeine Use - Rarely. Medical History Hematologic/Lymphatic Patient has history of Lymphedema - lower legs Respiratory Patient has history of Sleep Apnea Cardiovascular Patient has history of Hypertension, Peripheral Venous Disease Musculoskeletal Patient has history of Osteoarthritis Psychiatric Patient has history of Confinement Anxiety Medical A Surgical History Notes nd Cardiovascular Hyperlipidemia Musculoskeletal polyarthritis nodosa, fibromyalgia Objective Constitutional respirations regular, non-labored and within target range for patient.. Vitals Time Taken: 9:19 AM, Height: 62 in, Weight: 304 lbs, BMI: 55.6, Temperature: 97.4 F, Pulse: 79 bpm, Respiratory Rate: 17 breaths/min, Blood Pressure: 131/78 mmHg. Cardiovascular 2+ dorsalis pedis/posterior tibialis pulses. Psychiatric pleasant and  cooperative. General Notes: Bilateral lower extremities with scattered open wounds limited to skin breakdown. No signs of infection. Integumentary (Hair, Skin) Wound #10 status is Open. Original cause of wound was Gradually Appeared. The date acquired was: 09/08/2020. The wound has been in treatment 4 weeks. The wound is located on the Left,Circumferential Lower Leg. The wound measures 9cm length x 26cm width x 0.1cm depth; 183.783cm^2 area and 18.378cm^3 volume. There is Fat Layer (Subcutaneous Tissue) exposed. There is no tunneling or undermining noted. There is a large amount of serous drainage noted. The wound margin is indistinct and nonvisible. There is large (67-100%) pink granulation within the wound bed. There is a small (1-33%) amount of necrotic tissue within the wound bed including Adherent Slough. Wound #11 status is Open. Original cause of wound was Gradually Appeared. The date acquired was: 09/08/2020. The wound has been in treatment 4 weeks. The wound is located on the Right,Posterior Lower Leg. The wound measures 0.2cm length x 0.2cm width x 0.1cm depth; 0.031cm^2 area and 0.003cm^3 volume. There is Fat Layer (Subcutaneous Tissue)  exposed. There is no tunneling or undermining noted. There is a medium amount of serous drainage noted. The wound margin is indistinct and nonvisible. There is small (1-33%) red, pink granulation within the wound bed. There is a large (67-100%) amount of necrotic tissue within the wound bed including Adherent Slough. Wound #12 status is Open. Original cause of wound was Gradually Appeared. The date acquired was: 09/08/2020. The wound has been in treatment 4 weeks. The wound is located on the Right,Proximal,Anterior Lower Leg. The wound measures 0.2cm length x 0.2cm width x 0.1cm depth; 0.031cm^2 area and 0.003cm^3 volume. There is Fat Layer (Subcutaneous Tissue) exposed. There is no tunneling or undermining noted. There is a small amount of serosanguineous  drainage noted. The wound margin is flat and intact. There is large (67-100%) pink granulation within the wound bed. There is no necrotic tissue within the wound bed. Wound #13 status is Open. Original cause of wound was Gradually Appeared. The date acquired was: 09/08/2020. The wound has been in treatment 4 weeks. The wound is located on the Right,Anterior Lower Leg. The wound measures 0.2cm length x 0.2cm width x 0.1cm depth; 0.031cm^2 area and 0.003cm^3 volume. There is Fat Layer (Subcutaneous Tissue) exposed. There is no tunneling or undermining noted. There is a large amount of serous drainage noted. The wound margin is flat and intact. There is large (67-100%) pink granulation within the wound bed. There is no necrotic tissue within the wound bed. Assessment Active Problems ICD-10 Chronic venous hypertension (idiopathic) with ulcer and inflammation of bilateral lower extremity Non-pressure chronic ulcer of other part of left lower leg limited to breakdown of skin Non-pressure chronic ulcer of other part of right lower leg limited to breakdown of skin Lymphedema, not elsewhere classified Polyarteritis nodosa Patient's wounds have shown improvement. She continues to have chronic leg swelling and We will continue with silver alginate and 3 layer compression. She can follow-up next week for nurse visit again with me in 2 weeks. Procedures Wound #10 Pre-procedure diagnosis of Wound #10 is a Lymphedema located on the Left,Circumferential Lower Leg . There was a Three Layer Compression Therapy Procedure by [name], RN. Post procedure Diagnosis Wound #10: Same as Pre-Procedure Wound #11 Pre-procedure diagnosis of Wound #11 is a Lymphedema located on the Right,Posterior Lower Leg . There was a Three Layer Compression Therapy Procedure by [name], RN. Post procedure Diagnosis Wound #11: Same as Pre-Procedure Plan Follow-up Appointments: Return Appointment in 2 weeks. - with  Dr. Heber [place] Nurse Visit: - 1 week for rewrap Bathing/ Shower/ Hygiene: May shower with protection but do not get wound dressing(s) wet. - May use cast protectors...you can find those at [facility] or [facility] Edema Control - Lymphedema / SCD / Other: Lymphedema Pumps. Use Lymphedema pumps on leg(s) 2-3 times a day for 45-60 minutes. If wearing any wraps or hose, do not remove them. Continue exercising as instructed. - Keep phone line open for [employer] to schedule an appointment to set up pumps at home. Elevate legs to the level of the heart or above for 30 minutes daily and/or when sitting, a frequency of: Avoid standing for long periods of time. Exercise regularly WOUND #10: - Lower Leg Wound Laterality: Left, Circumferential Cleanser: Soap and Water 1 x Per Week/30 Days Discharge Instructions: May shower and wash wound with dial antibacterial soap and water prior to dressing change. Cleanser: Wound Cleanser 1 x Per Week/30 Days Discharge Instructions: Cleanse the wound with wound cleanser prior to applying a clean dressing using  gauze sponges, not tissue or cotton balls. Peri-Wound Care: Triamcinolone 15 (g) 1 x Per Week/30 Days Discharge Instructions: Use triamcinolone 15 (g) as directed Peri-Wound Care: Sween Lotion (Moisturizing lotion) 1 x Per Week/30 Days Discharge Instructions: Apply moisturizing lotion as directed Prim Dressing: KerraCel Ag Gelling Fiber Dressing, 4x5 in (silver alginate) 1 x Per Week/30 Days ary Discharge Instructions: Apply silver alginate to wound bed as instructed Secondary Dressing: Woven Gauze Sponge, Non-Sterile 4x4 in 1 x Per Week/30 Days Discharge Instructions: Apply over primary dressing as directed. Secondary Dressing: ABD Pad, 8x10 1 x Per Week/30 Days Discharge Instructions: Apply over primary dressing as directed. Secondary Dressing: Zetuvit Plus 4x8 in 1 x Per Week/30 Days Discharge Instructions: Apply over primary dressing as directed. Com pression Wrap:  ThreePress (3 layer compression wrap) 1 x Per Week/30 Days Discharge Instructions: Apply three layer compression as directed. WOUND #11: - Lower Leg Wound Laterality: Right, Posterior Cleanser: Soap and Water 1 x Per Week/30 Days Discharge Instructions: May shower and wash wound with dial antibacterial soap and water prior to dressing change. Cleanser: Wound Cleanser 1 x Per Week/30 Days Discharge Instructions: Cleanse the wound with wound cleanser prior to applying a clean dressing using gauze sponges, not tissue or cotton balls. Peri-Wound Care: Triamcinolone 15 (g) 1 x Per Week/30 Days Discharge Instructions: Use triamcinolone 15 (g) as directed Peri-Wound Care: Sween Lotion (Moisturizing lotion) 1 x Per Week/30 Days Discharge Instructions: Apply moisturizing lotion as directed Prim Dressing: KerraCel Ag Gelling Fiber Dressing, 4x5 in (silver alginate) 1 x Per Week/30 Days ary Discharge Instructions: Apply silver alginate to wound bed as instructed Secondary Dressing: Woven Gauze Sponge, Non-Sterile 4x4 in 1 x Per Week/30 Days Discharge Instructions: Apply over primary dressing as directed. Com pression Wrap: ThreePress (3 layer compression wrap) 1 x Per Week/30 Days Discharge Instructions: Apply three layer compression as directed. WOUND #12: - Lower Leg Wound Laterality: Right, Anterior, Proximal Cleanser: Soap and Water 1 x Per Week/30 Days Discharge Instructions: May shower and wash wound with dial antibacterial soap and water prior to dressing change. Cleanser: Wound Cleanser 1 x Per Week/30 Days Discharge Instructions: Cleanse the wound with wound cleanser prior to applying a clean dressing using gauze sponges, not tissue or cotton balls. Peri-Wound Care: Triamcinolone 15 (g) 1 x Per Week/30 Days Discharge Instructions: Use triamcinolone 15 (g) as directed Peri-Wound Care: Sween Lotion (Moisturizing lotion) 1 x Per Week/30 Days Discharge Instructions: Apply moisturizing lotion as  directed Prim Dressing: KerraCel Ag Gelling Fiber Dressing, 4x5 in (silver alginate) 1 x Per Week/30 Days ary Discharge Instructions: Apply silver alginate to wound bed as instructed Secondary Dressing: Woven Gauze Sponge, Non-Sterile 4x4 in 1 x Per Week/30 Days Discharge Instructions: Apply over primary dressing as directed. Secondary Dressing: ABD Pad, 8x10 1 x Per Week/30 Days Discharge Instructions: Apply over primary dressing as directed. Secondary Dressing: Zetuvit Plus 4x8 in 1 x Per Week/30 Days Discharge Instructions: Apply over primary dressing as directed. Com pression Wrap: ThreePress (3 layer compression wrap) 1 x Per Week/30 Days Discharge Instructions: Apply three layer compression as directed. WOUND #13: - Lower Leg Wound Laterality: Right, Anterior Cleanser: Soap and Water 1 x Per Week/30 Days Discharge Instructions: May shower and wash wound with dial antibacterial soap and water prior to dressing change. Cleanser: Wound Cleanser 1 x Per Week/30 Days Discharge Instructions: Cleanse the wound with wound cleanser prior to applying a clean dressing using gauze sponges, not tissue or cotton balls. Peri-Wound Care: Triamcinolone 15 (g) 1  x Per Week/30 Days Discharge Instructions: Use triamcinolone 15 (g) as directed Peri-Wound Care: Sween Lotion (Moisturizing lotion) 1 x Per Week/30 Days Discharge Instructions: Apply moisturizing lotion as directed Prim Dressing: KerraCel Ag Gelling Fiber Dressing, 4x5 in (silver alginate) 1 x Per Week/30 Days ary Discharge Instructions: Apply silver alginate to wound bed as instructed Secondary Dressing: Woven Gauze Sponge, Non-Sterile 4x4 in 1 x Per Week/30 Days Discharge Instructions: Apply over primary dressing as directed. Secondary Dressing: ABD Pad, 8x10 1 x Per Week/30 Days Discharge Instructions: Apply over primary dressing as directed. Secondary Dressing: Zetuvit Plus 4x8 in 1 x Per Week/30 Days Discharge Instructions: Apply over  primary dressing as directed. Com pression Wrap: ThreePress (3 layer compression wrap) 1 x Per Week/30 Days Discharge Instructions: Apply three layer compression as directed. 1. 3 layer compression with silver alginate 2. Follow-up with me in 2 weeks and in 1 week for nurse visit Electronic Signature(s) Signed: 10/21/2020 11:47:55 AM By: Kalman Shan DO Entered By: Kalman Shan on 10/21/2020 11:47:09 -------------------------------------------------------------------------------- HxROS Details Patient Name: Date of Service: [name], Erica Kluver K. 10/21/2020 9:00 A M Medical Record Number: [id number] Patient Account Number: [id number] Date of Birth/Sex: Treating RN: 11/21/1970 (50 y.o. [name] Primary Care Provider: [name] Other Clinician: Referring Provider: Treating Provider/Extender: Judeth Porch in Treatment: 18 Information Obtained From Patient Hematologic/Lymphatic Medical History: Positive for: Lymphedema - lower legs Respiratory Medical History: Positive for: Sleep Apnea Cardiovascular Medical History: Positive for: Hypertension; Peripheral Venous Disease Past Medical History Notes: Hyperlipidemia Musculoskeletal Medical History: Positive for: Osteoarthritis Past Medical History Notes: polyarthritis nodosa, fibromyalgia Psychiatric Medical History: Positive for: Confinement Anxiety Immunizations Pneumococcal Vaccine: Received Pneumococcal Vaccination: Yes Implantable Devices None Family and Social History Cancer: Yes - Father,Maternal Grandparents; Diabetes: Yes - Mother; Heart Disease: Yes - Father; Hereditary Spherocytosis: No; Hypertension: Yes - Mother; Lung Disease: Yes - Mother; Never smoker; Marital Status - Single; Alcohol Use: Never; Drug Use: No History; Caffeine Use: Rarely; Financial Concerns: No; Food, Clothing or Shelter Needs: No; Support System Lacking: No; Transportation Concerns: No Electronic  Signature(s) Signed: 10/21/2020 11:47:55 AM By: Kalman Shan DO Signed: 10/21/2020 6:12:56 PM By: Baruch Gouty RN, BSN Entered By: Kalman Shan on 10/21/2020 11:44:59 -------------------------------------------------------------------------------- [facility] Details Patient Name: Date of Service: [name]. 10/21/2020 Medical Record Number: [id number] Patient Account Number: [id number] Date of Birth/Sex: Treating RN: Jul 24, 1970 (51 y.o. 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 Modifier: Quantity: 1 Physician Procedures : CPT4 Code Description Modifier [id number] - WC PHYS LEVEL 3 - EST PT ICD-10 Diagnosis Description I87.333 Chronic venous hypertension (idiopathic) with ulcer and inflammation of bilateral lower extremity L97.821 Non-pressure chronic ulcer of other  part of left lower leg limited to breakdown of skin L97.811 Non-pressure chronic ulcer of other part of right lower leg limited to breakdown of skin I89.0 Lymphedema, not elsewhere classified Quantity: 1 Electronic Signature(s)  Signed: 10/21/2020 11:47:55 AM By: Kalman Shan DO Entered By: Kalman Shan on 10/21/2020 11:47:30

## 2020-10-21 NOTE — Progress Notes (Signed)
KORRINE, SICARD ([id number]) Visit Report for 10/21/2020 Arrival Information Details Patient Name: Date of Service: HA NECHELLE, PETRIZZO 10/21/2020 9:00 A M Medical Record Number: [id number] Patient Account Number: [id number] Date of Birth/Sex: Treating RN: Sep 16, 1970 (50 y.o. [name], [name] Primary Care Paislyn Domenico: [name] Other Clinician: Referring Jaimeson Gopal: Treating Terriona Horlacher/Extender: Judeth Porch in Treatment: 18 Visit Information History Since Last Visit Added or deleted any medications: No Patient Arrived: Kasandra Knudsen Any new allergies or adverse reactions: No Arrival Time: 09:13 Had a fall or experienced change in No Accompanied By: self activities of daily living that may affect Transfer Assistance: None risk of falls: Patient Identification Verified: Yes Signs or symptoms of abuse/neglect since last visito No Secondary Verification Process Completed: Yes Hospitalized since last visit: No Patient Requires Transmission-Based Precautions: No Implantable device outside of the clinic excluding No Patient Has Alerts: No cellular tissue based products placed in the center since last visit: Has Dressing in Place as Prescribed: Yes Has Compression in Place as Prescribed: Yes Pain Present Now: No Electronic Signature(s) Signed: 10/21/2020 6:04:38 PM By: [name] RN Entered By: [name] on 10/21/2020 09:19:25 -------------------------------------------------------------------------------- Compression Therapy Details Patient Name: Date of Service: [place], Rolette K. 10/21/2020 9:00 A M Medical Record Number: [id number] Patient Account Number: [id number] Date of Birth/Sex: Treating RN: 11-12-1970 (50 y.o. [name] Primary Care Trinaty Bundrick: [name] Other Clinician: Referring Casey Fye: Treating Trudie Cervantes/Extender: Judeth Porch in Treatment: 18 Compression Therapy Performed for Wound Assessment:  Wound #10 Left,Circumferential Lower Leg Performed By: Clinician [name], RN Compression Type: Three Layer Post Procedure Diagnosis Same as Pre-procedure Electronic Signature(s) Signed: 10/21/2020 6:12:56 PM By: Baruch Gouty RN, BSN Entered By: Baruch Gouty on 10/21/2020 10:03:45 -------------------------------------------------------------------------------- Compression Therapy Details Patient Name: Date of Service: [name] 10/21/2020 9:00 A M Medical Record Number: [id number] Patient Account Number: [id number] Date of Birth/Sex: Treating RN: 1971/01/21 (50 y.o. [name] Primary Care Eulon Allnutt: [name] Other Clinician: Referring Keeshia Sanderlin: Treating Rileigh Kawashima/Extender: Judeth Porch in Treatment: 18 Compression Therapy Performed for Wound Assessment: Wound #11 Right,Posterior Lower Leg Performed By: Clinician [name], RN Compression Type: Three Layer Post Procedure Diagnosis Same as Pre-procedure Electronic Signature(s) Signed: 10/21/2020 6:12:56 PM By: Baruch Gouty RN, BSN Entered By: Baruch Gouty on 10/21/2020 10:03:46 -------------------------------------------------------------------------------- Encounter Discharge Information Details Patient Name: Date of Service: [name] 10/21/2020 9:00 A M Medical Record Number: [id number] Patient Account Number: [id number] Date of Birth/Sex: Treating RN: 1970-11-11 (50 y.o. [name], [name] Primary Care Luwana Butrick: [name] Other Clinician: Referring Nesreen Albano: Treating Solana Coggin/Extender: Judeth Porch in Treatment: 28 Encounter Discharge Information Items Discharge Condition: Stable Ambulatory Status: Ambulatory Discharge Destination: Home Transportation: Private Auto Accompanied By: self Schedule Follow-up Appointment: Yes Clinical Summary of Care: Patient Declined Electronic Signature(s) Signed:  10/21/2020 6:04:38 PM By: [name] RN Entered By: [name] on 10/21/2020 11:11:20 -------------------------------------------------------------------------------- Lower Extremity Assessment Details Patient Name: Date of Service: [name] 10/21/2020 9:00 A M Medical Record Number: [id number] Patient Account Number: [id number] Date of Birth/Sex: Treating RN: Nov 11, 1970 (50 y.o. [name], [name] Primary Care Floyd Lusignan: [name] Other Clinician: Referring Josephene Marrone: Treating Tyshawn Keel/Extender: Judeth Porch in Treatment: 18 Edema Assessment Assessed: Shirlyn Goltz: Yes] Patrice Paradise: Yes] Edema: [Left: Yes] [Right: Yes] Calf Left: Right: Point of Measurement: 28 cm From Medial Instep 53.4 cm 49 cm Ankle Left: Right: Point of Measurement: 11 cm From Medial Instep 31.4 cm 28 cm Vascular  Assessment Pulses: Dorsalis Pedis Palpable: [Left:Yes] [Right:Yes] Posterior Tibial Palpable: [Left:Yes] [Right:Yes] Electronic Signature(s) Signed: 10/21/2020 6:04:38 PM By: [name] RN Entered By: [name] on 10/21/2020 09:20:45 -------------------------------------------------------------------------------- Multi Wound Chart Details Patient Name: Date of Service: [name] 10/21/2020 9:00 A M Medical Record Number: [id number] Patient Account Number: [id number] Date of Birth/Sex: Treating RN: 08-07-1970 (50 y.o. Martyn Malay, [name] Primary Care Luie Laneve: [name] Other Clinician: Referring Darnita Woodrum: Treating Shelvia Fojtik/Extender: Judeth Porch in Treatment: 18 Vital Signs Height(in): 62 Pulse(bpm): 32 Weight(lbs): 304 Blood Pressure(mmHg): 131/78 Body Mass Index(BMI): 56 Temperature(F): 97.4 Respiratory Rate(breaths/min): 17 Photos: [10:No Photos Left, Circumferential Lower Leg] [11:No Photos Right, Posterior Lower Leg] [12:No Photos Right, Proximal, Anterior Lower Leg] Wound Location:  [10:Gradually Appeared] [11:Gradually Appeared] [12:Gradually Appeared] Wounding Event: [10:Lymphedema] [11:Lymphedema] [12:Lymphedema] Primary Etiology: [10:Lymphedema, Sleep Apnea,] [11:Lymphedema, Sleep Apnea,] [12:Lymphedema, Sleep Apnea,] Comorbid History: [10:Hypertension, Peripheral Venous Disease, Osteoarthritis, Confinement Disease, Osteoarthritis, Confinement Disease, Osteoarthritis, Confinement Anxiety 09/08/2020] [11:Hypertension, Peripheral Venous Anxiety 09/08/2020]  [12:Hypertension, Peripheral Venous Anxiety 09/08/2020] Date Acquired: [10:4] [11:4] [12:4] Weeks of Treatment: [10:Open] [11:Open] [12:Open] Wound Status: [10:9x26x0.1] [11:0.2x0.2x0.1] [12:0.2x0.2x0.1] Measurements L x W x D (cm) [10:183.783] [11:0.031] [12:0.031] A (cm) : rea [10:18.378] [11:0.003] [12:0.003] Volume (cm) : [10:30.10%] [11:99.40%] [12:96.10%] % Reduction in Area: [10:30.20%] [11:99.50%] [12:96.20%] % Reduction in Volume: [10:Full Thickness Without Exposed] [11:Full Thickness Without Exposed] [12:Full Thickness Without Exposed] Classification: [10:Support Structures Large] [11:Support Structures Medium] [12:Support Structures Small] Exudate Amount: [10:Serous] [11:Serous] [12:Serosanguineous] Exudate Type: [10:amber] [11:amber] [12:red, brown] Exudate Color: [10:Indistinct, nonvisible] [11:Indistinct, nonvisible] [12:Flat and Intact] Wound Margin: [10:Large (67-100%)] [11:Small (1-33%)] [12:Large (67-100%)] Granulation Amount: [10:Pink] [11:Red, Pink] [12:Pink] Granulation Quality: [10:Small (1-33%)] [11:Large (67-100%)] [12:None Present (0%)] Necrotic Amount: [10:Fat Layer (Subcutaneous Tissue): Yes Fat Layer (Subcutaneous Tissue): Yes Fat Layer (Subcutaneous Tissue): Yes] Exposed Structures: [10:Fascia: No Tendon: No Muscle: No Joint: No Bone: No Medium (34-66%)] [11:Fascia: No Tendon: No Muscle: No Joint: No Bone: No Medium (34-66%)] [12:Fascia: No Tendon: No Muscle: No Joint: No Bone: No Medium  (34-66%)] Epithelialization: [10:Compression Therapy] [11:Compression Therapy] [12:N/A] Wound Number: 13 N/A N/A Photos: No Photos N/A N/A Right, Anterior Lower Leg N/A N/A Wound Location: Gradually Appeared N/A N/A Wounding Event: Lymphedema N/A N/A Primary Etiology: Lymphedema, Sleep Apnea, N/A N/A Comorbid History: Hypertension, Peripheral Venous Disease, Osteoarthritis, Confinement Anxiety 09/08/2020 N/A N/A Date Acquired: 4 N/A N/A Weeks of Treatment: Open N/A N/A Wound Status: 0.2x0.2x0.1 N/A N/A Measurements L x W x D (cm) 0.031 N/A N/A A (cm) : rea 0.003 N/A N/A Volume (cm) : 97.80% N/A N/A % Reduction in Area: 97.80% N/A N/A % Reduction in Volume: Full Thickness Without Exposed N/A N/A Classification: Support Structures Large N/A N/A Exudate Amount: Serous N/A N/A Exudate Type: amber N/A N/A Exudate Color: Flat and Intact N/A N/A Wound Margin: Large (67-100%) N/A N/A Granulation Amount: Pink N/A N/A Granulation Quality: None Present (0%) N/A N/A Necrotic Amount: Fat Layer (Subcutaneous Tissue): Yes N/A N/A Exposed Structures: Fascia: No Tendon: No Muscle: No Joint: No Bone: No Small (1-33%) N/A N/A Epithelialization: N/A N/A N/A Procedures Performed: Treatment Notes Wound #10 (Lower Leg) Wound Laterality: Left, Circumferential Cleanser Wound Cleanser Discharge Instruction: Cleanse the wound with wound cleanser prior to applying a clean dressing using gauze sponges, not tissue or cotton balls. Soap and Water Discharge Instruction: May shower and wash wound with dial antibacterial soap and water prior to dressing change. Peri-Wound Care Triamcinolone 15 (g) Discharge Instruction: Use triamcinolone 15 (g) as directed Sween Lotion (Moisturizing lotion)  Discharge Instruction: Apply moisturizing lotion as directed Topical Primary Dressing KerraCel Ag Gelling Fiber Dressing, 4x5 in (silver alginate) Discharge Instruction: Apply silver  alginate to wound bed as instructed Secondary Dressing Woven Gauze Sponge, Non-Sterile 4x4 in Discharge Instruction: Apply over primary dressing as directed. Zetuvit Plus 4x8 in Discharge Instruction: Apply over primary dressing as directed. ABD Pad, 8x10 Discharge Instruction: Apply over primary dressing as directed. Secured With Compression Wrap ThreePress (3 layer compression wrap) Discharge Instruction: Apply three layer compression as directed. Compression Stockings Add-Ons Wound #11 (Lower Leg) Wound Laterality: Right, Posterior Cleanser Wound Cleanser Discharge Instruction: Cleanse the wound with wound cleanser prior to applying a clean dressing using gauze sponges, not tissue or cotton balls. Soap and Water Discharge Instruction: May shower and wash wound with dial antibacterial soap and water prior to dressing change. Peri-Wound Care Triamcinolone 15 (g) Discharge Instruction: Use triamcinolone 15 (g) as directed Sween Lotion (Moisturizing lotion) Discharge Instruction: Apply moisturizing lotion as directed Topical Primary Dressing KerraCel Ag Gelling Fiber Dressing, 4x5 in (silver alginate) Discharge Instruction: Apply silver alginate to wound bed as instructed Secondary Dressing Woven Gauze Sponge, Non-Sterile 4x4 in Discharge Instruction: Apply over primary dressing as directed. Secured With Compression Wrap ThreePress (3 layer compression wrap) Discharge Instruction: Apply three layer compression as directed. Compression Stockings Add-Ons Wound #12 (Lower Leg) Wound Laterality: Right, Anterior, Proximal Cleanser Wound Cleanser Discharge Instruction: Cleanse the wound with wound cleanser prior to applying a clean dressing using gauze sponges, not tissue or cotton balls. Soap and Water Discharge Instruction: May shower and wash wound with dial antibacterial soap and water prior to dressing change. Peri-Wound Care Triamcinolone 15 (g) Discharge Instruction: Use  triamcinolone 15 (g) as directed Sween Lotion (Moisturizing lotion) Discharge Instruction: Apply moisturizing lotion as directed Topical Primary Dressing KerraCel Ag Gelling Fiber Dressing, 4x5 in (silver alginate) Discharge Instruction: Apply silver alginate to wound bed as instructed Secondary Dressing Woven Gauze Sponge, Non-Sterile 4x4 in Discharge Instruction: Apply over primary dressing as directed. Zetuvit Plus 4x8 in Discharge Instruction: Apply over primary dressing as directed. ABD Pad, 8x10 Discharge Instruction: Apply over primary dressing as directed. Secured With Compression Wrap ThreePress (3 layer compression wrap) Discharge Instruction: Apply three layer compression as directed. Compression Stockings Add-Ons Wound #13 (Lower Leg) Wound Laterality: Right, Anterior Cleanser Wound Cleanser Discharge Instruction: Cleanse the wound with wound cleanser prior to applying a clean dressing using gauze sponges, not tissue or cotton balls. Soap and Water Discharge Instruction: May shower and wash wound with dial antibacterial soap and water prior to dressing change. Peri-Wound Care Triamcinolone 15 (g) Discharge Instruction: Use triamcinolone 15 (g) as directed Sween Lotion (Moisturizing lotion) Discharge Instruction: Apply moisturizing lotion as directed Topical Primary Dressing KerraCel Ag Gelling Fiber Dressing, 4x5 in (silver alginate) Discharge Instruction: Apply silver alginate to wound bed as instructed Secondary Dressing Woven Gauze Sponge, Non-Sterile 4x4 in Discharge Instruction: Apply over primary dressing as directed. Zetuvit Plus 4x8 in Discharge Instruction: Apply over primary dressing as directed. ABD Pad, 8x10 Discharge Instruction: Apply over primary dressing as directed. Secured With Compression Wrap ThreePress (3 layer compression wrap) Discharge Instruction: Apply three layer compression as directed. Compression Stockings Add-Ons Electronic  Signature(s) Signed: 10/21/2020 11:47:55 AM By: Kalman Shan DO Signed: 10/21/2020 6:12:56 PM By: Baruch Gouty RN, BSN Entered By: Kalman Shan on 10/21/2020 11:42:27 -------------------------------------------------------------------------------- [place] Details Patient Name: Date of Service: [name] 10/21/2020 9:00 A M Medical Record Number: [id number] Patient Account Number: [id number] Date of Birth/Sex: Treating RN:  [birth year] (50 y.o. [name] Primary Care Rawlins Stuard: [name] Other Clinician: Referring Rayshawn Visconti: Treating Sayla Golonka/Extender: Judeth Porch in Treatment: 70 Active Inactive Abuse / Safety / Falls / Self Care Management Nursing Diagnoses: Potential for falls Goals: Patient will remain injury free related to falls Date Initiated: 09/22/2020 Target Resolution Date: 11/20/2020 Goal Status: Active Interventions: Assess fall risk on admission and as needed Assess: immobility, friction, shearing, incontinence upon admission and as needed Assess self care needs on admission and as needed Provide education on fall prevention Notes: Venous Leg Ulcer Nursing Diagnoses: Potential for venous Insuffiency (use before diagnosis confirmed) Goals: Patient will maintain optimal edema control Date Initiated: 09/22/2020 Target Resolution Date: 11/13/2020 Goal Status: Active Interventions: Assess peripheral edema status every visit. Compression as ordered Provide education on venous insufficiency Notes: Electronic Signature(s) Signed: 10/21/2020 6:12:56 PM By: Baruch Gouty RN, BSN Entered By: Baruch Gouty on 10/21/2020 10:01:51 -------------------------------------------------------------------------------- Pain Assessment Details Patient Name: Date of Service: [name] 10/21/2020 9:00 A M Medical Record Number: [id number] Patient Account Number: [id number] Date of Birth/Sex: Treating  RN: 25-Apr-1971 (50 y.o. [name], [name] Primary Care Michelene Keniston: [name] Other Clinician: Referring Rhetta Cleek: Treating Prospero Mahnke/Extender: Judeth Porch in Treatment: 18 Active Problems Location of Pain Severity and Description of Pain Patient Has Paino No Site Locations Pain Management and Medication Current Pain Management: Electronic Signature(s) Signed: 10/21/2020 6:04:38 PM By: [name] RN Entered By: [name] on 10/21/2020 09:19:46 -------------------------------------------------------------------------------- Patient/Caregiver Education Details Patient Name: Date of Service: HA [name], Kaysha K. 6/8/2022andnbsp9:00 A M Medical Record Number: [id number] Patient Account Number: [id number] Date of Birth/Gender: Treating RN: 06-Jan-1971 (50 y.o. [name] Primary Care Physician: [name] Other Clinician: Referring Physician: Treating Physician/Extender: Judeth Porch in Treatment: 18 Education Assessment Education Provided To: Patient Education Topics Provided Venous: Methods: Explain/Verbal Responses: Reinforcements needed, State content correctly Wound/Skin Impairment: Methods: Explain/Verbal Responses: Reinforcements needed, State content correctly Electronic Signature(s) Signed: 10/21/2020 6:12:56 PM By: Baruch Gouty RN, BSN Entered By: Baruch Gouty on 10/21/2020 10:02:12 -------------------------------------------------------------------------------- Wound Assessment Details Patient Name: Date of Service: [name] 10/21/2020 9:00 A M Medical Record Number: [id number] Patient Account Number: [id number] Date of Birth/Sex: Treating RN: 1970-07-08 (50 y.o. [name], [name] Primary Care Toyna Erisman: [name] Other Clinician: Referring Antwonette Feliz: Treating Jaylinn Hellenbrand/Extender: Judeth Porch in Treatment: 18 Wound  Status Wound Number: 10 Primary Lymphedema Etiology: Wound Location: Left, Circumferential Lower Leg Wound Open Wounding Event: Gradually Appeared Status: Date Acquired: 09/08/2020 Comorbid Lymphedema, Sleep Apnea, Hypertension, Peripheral Venous Weeks Of Treatment: 4 History: Disease, Osteoarthritis, Confinement Anxiety Clustered Wound: No Photos Wound Measurements Length: (cm) 9 Width: (cm) 26 Depth: (cm) 0.1 Area: (cm) 183.783 Volume: (cm) 18.378 % Reduction in Area: 30.1% % Reduction in Volume: 30.2% Epithelialization: Medium (34-66%) Tunneling: No Undermining: No Wound Description Classification: Full Thickness Without Exposed Support Structures Wound Margin: Indistinct, nonvisible Exudate Amount: Large Exudate Type: Serous Exudate Color: amber Foul Odor After Cleansing: No Slough/Fibrino Yes Wound Bed Granulation Amount: Large (67-100%) Exposed Structure Granulation Quality: Pink Fascia Exposed: No Necrotic Amount: Small (1-33%) Fat Layer (Subcutaneous Tissue) Exposed: Yes Necrotic Quality: Adherent Slough Tendon Exposed: No Muscle Exposed: No Joint Exposed: No Bone Exposed: No Treatment Notes Wound #10 (Lower Leg) Wound Laterality: Left, Circumferential Cleanser Wound Cleanser Discharge Instruction: Cleanse the wound with wound cleanser prior to applying a clean dressing using gauze sponges, not tissue or cotton balls. Soap and Water Discharge Instruction: May shower and  wash wound with dial antibacterial soap and water prior to dressing change. Peri-Wound Care Triamcinolone 15 (g) Discharge Instruction: Use triamcinolone 15 (g) as directed Sween Lotion (Moisturizing lotion) Discharge Instruction: Apply moisturizing lotion as directed Topical Primary Dressing KerraCel Ag Gelling Fiber Dressing, 4x5 in (silver alginate) Discharge Instruction: Apply silver alginate to wound bed as instructed Secondary Dressing Woven Gauze Sponge, Non-Sterile 4x4  in Discharge Instruction: Apply over primary dressing as directed. Zetuvit Plus 4x8 in Discharge Instruction: Apply over primary dressing as directed. ABD Pad, 8x10 Discharge Instruction: Apply over primary dressing as directed. Secured With Compression Wrap ThreePress (3 layer compression wrap) Discharge Instruction: Apply three layer compression as directed. Compression Stockings Add-Ons Electronic Signature(s) Signed: 10/21/2020 5:19:11 PM By: Sandre Kitty Signed: 10/21/2020 6:04:38 PM By: [name] RN Entered By: Sandre Kitty on 10/21/2020 17:05:37 -------------------------------------------------------------------------------- Wound Assessment Details Patient Name: Date of Service: [name] 10/21/2020 9:00 A M Medical Record Number: [id number] Patient Account Number: [id number] Date of Birth/Sex: Treating RN: [birth year] (50 y.o. [name], [name] Primary Care Shaelyn Decarli: [name] Other Clinician: Referring Moe Graca: Treating Metta Koranda/Extender: Judeth Porch in Treatment: 18 Wound Status Wound Number: 11 Primary Lymphedema Etiology: Wound Location: Right, Posterior Lower Leg Wound Open Wounding Event: Gradually Appeared Status: Date Acquired: 09/08/2020 Comorbid Lymphedema, Sleep Apnea, Hypertension, Peripheral Venous Weeks Of Treatment: 4 History: Disease, Osteoarthritis, Confinement Anxiety Clustered Wound: No Photos Wound Measurements Length: (cm) 0.2 Width: (cm) 0.2 Depth: (cm) 0.1 Area: (cm) 0.031 Volume: (cm) 0.003 % Reduction in Area: 99.4% % Reduction in Volume: 99.5% Epithelialization: Medium (34-66%) Tunneling: No Undermining: No Wound Description Classification: Full Thickness Without Exposed Support Structures Wound Margin: Indistinct, nonvisible Exudate Amount: Medium Exudate Type: Serous Exudate Color: amber Foul Odor After Cleansing: No Slough/Fibrino No Wound Bed Granulation  Amount: Small (1-33%) Exposed Structure Granulation Quality: Red, Pink Fascia Exposed: No Necrotic Amount: Large (67-100%) Fat Layer (Subcutaneous Tissue) Exposed: Yes Necrotic Quality: Adherent Slough Tendon Exposed: No Muscle Exposed: No Joint Exposed: No Bone Exposed: No Treatment Notes Wound #11 (Lower Leg) Wound Laterality: Right, Posterior Cleanser Wound Cleanser Discharge Instruction: Cleanse the wound with wound cleanser prior to applying a clean dressing using gauze sponges, not tissue or cotton balls. Soap and Water Discharge Instruction: May shower and wash wound with dial antibacterial soap and water prior to dressing change. Peri-Wound Care Triamcinolone 15 (g) Discharge Instruction: Use triamcinolone 15 (g) as directed Sween Lotion (Moisturizing lotion) Discharge Instruction: Apply moisturizing lotion as directed Topical Primary Dressing KerraCel Ag Gelling Fiber Dressing, 4x5 in (silver alginate) Discharge Instruction: Apply silver alginate to wound bed as instructed Secondary Dressing Woven Gauze Sponge, Non-Sterile 4x4 in Discharge Instruction: Apply over primary dressing as directed. Secured With Compression Wrap ThreePress (3 layer compression wrap) Discharge Instruction: Apply three layer compression as directed. Compression Stockings Add-Ons Electronic Signature(s) Signed: 10/21/2020 5:19:11 PM By: Sandre Kitty Signed: 10/21/2020 6:04:38 PM By: [name] RN Entered By: Sandre Kitty on 10/21/2020 17:07:15 -------------------------------------------------------------------------------- Wound Assessment Details Patient Name: Date of Service: [name] 10/21/2020 9:00 A M Medical Record Number: [id number] Patient Account Number: [id number] Date of Birth/Sex: Treating RN: 1970/06/27 (50 y.o. [name], [name] Primary Care Bonnie Overdorf: [name] Other Clinician: Referring Adlean Hardeman: Treating Sohum Delillo/Extender: Judeth Porch in Treatment: 18 Wound Status Wound Number: 12 Primary Lymphedema Etiology: Wound Location: Right, Proximal, Anterior Lower Leg Wound Open Wounding Event: Gradually Appeared Status: Date Acquired: 09/08/2020 Comorbid Lymphedema, Sleep Apnea, Hypertension, Peripheral Venous Weeks Of Treatment: 4 History:  Disease, Osteoarthritis, Confinement Anxiety Clustered Wound: No Photos Wound Measurements Length: (cm) 0.2 Width: (cm) 0.2 Depth: (cm) 0.1 Area: (cm) 0.031 Volume: (cm) 0.003 % Reduction in Area: 96.1% % Reduction in Volume: 96.2% Epithelialization: Medium (34-66%) Tunneling: No Undermining: No Wound Description Classification: Full Thickness Without Exposed Support Structures Wound Margin: Flat and Intact Exudate Amount: Small Exudate Type: Serosanguineous Exudate Color: red, brown Foul Odor After Cleansing: No Slough/Fibrino No Wound Bed Granulation Amount: Large (67-100%) Exposed Structure Granulation Quality: Pink Fascia Exposed: No Necrotic Amount: None Present (0%) Fat Layer (Subcutaneous Tissue) Exposed: Yes Tendon Exposed: No Muscle Exposed: No Joint Exposed: No Bone Exposed: No Treatment Notes Wound #12 (Lower Leg) Wound Laterality: Right, Anterior, Proximal Cleanser Wound Cleanser Discharge Instruction: Cleanse the wound with wound cleanser prior to applying a clean dressing using gauze sponges, not tissue or cotton balls. Soap and Water Discharge Instruction: May shower and wash wound with dial antibacterial soap and water prior to dressing change. Peri-Wound Care Triamcinolone 15 (g) Discharge Instruction: Use triamcinolone 15 (g) as directed Sween Lotion (Moisturizing lotion) Discharge Instruction: Apply moisturizing lotion as directed Topical Primary Dressing KerraCel Ag Gelling Fiber Dressing, 4x5 in (silver alginate) Discharge Instruction: Apply silver alginate to wound bed as instructed Secondary  Dressing Woven Gauze Sponge, Non-Sterile 4x4 in Discharge Instruction: Apply over primary dressing as directed. Zetuvit Plus 4x8 in Discharge Instruction: Apply over primary dressing as directed. ABD Pad, 8x10 Discharge Instruction: Apply over primary dressing as directed. Secured With Compression Wrap ThreePress (3 layer compression wrap) Discharge Instruction: Apply three layer compression as directed. Compression Stockings Add-Ons Electronic Signature(s) Signed: 10/21/2020 5:19:11 PM By: Sandre Kitty Signed: 10/21/2020 6:04:38 PM By: [name] RN Entered By: Sandre Kitty on 10/21/2020 17:06:46 -------------------------------------------------------------------------------- Wound Assessment Details Patient Name: Date of Service: [name] 10/21/2020 9:00 A M Medical Record Number: [id number] Patient Account Number: [id number] Date of Birth/Sex: Treating RN: 01/26/1971 (50 y.o. [name], [name] Primary Care Kevork Joyce: [name] Other Clinician: Referring Filemon Breton: Treating Sivan Quast/Extender: Judeth Porch in Treatment: 18 Wound Status Wound Number: 13 Primary Lymphedema Etiology: Wound Location: Right, Anterior Lower Leg Wound Open Wounding Event: Gradually Appeared Status: Date Acquired: 09/08/2020 Comorbid Lymphedema, Sleep Apnea, Hypertension, Peripheral Venous Weeks Of Treatment: 4 History: Disease, Osteoarthritis, Confinement Anxiety Clustered Wound: No Photos Wound Measurements Length: (cm) 0.2 Width: (cm) 0.2 Depth: (cm) 0.1 Area: (cm) 0.031 Volume: (cm) 0.003 % Reduction in Area: 97.8% % Reduction in Volume: 97.8% Epithelialization: Small (1-33%) Tunneling: No Undermining: No Wound Description Classification: Full Thickness Without Exposed Support Structures Wound Margin: Flat and Intact Exudate Amount: Large Exudate Type: Serous Exudate Color: amber Foul Odor After Cleansing:  No Slough/Fibrino No Wound Bed Granulation Amount: Large (67-100%) Exposed Structure Granulation Quality: Pink Fascia Exposed: No Necrotic Amount: None Present (0%) Fat Layer (Subcutaneous Tissue) Exposed: Yes Tendon Exposed: No Muscle Exposed: No Joint Exposed: No Bone Exposed: No Treatment Notes Wound #13 (Lower Leg) Wound Laterality: Right, Anterior Cleanser Wound Cleanser Discharge Instruction: Cleanse the wound with wound cleanser prior to applying a clean dressing using gauze sponges, not tissue or cotton balls. Soap and Water Discharge Instruction: May shower and wash wound with dial antibacterial soap and water prior to dressing change. Peri-Wound Care Triamcinolone 15 (g) Discharge Instruction: Use triamcinolone 15 (g) as directed Sween Lotion (Moisturizing lotion) Discharge Instruction: Apply moisturizing lotion as directed Topical Primary Dressing KerraCel Ag Gelling Fiber Dressing, 4x5 in (silver alginate) Discharge Instruction: Apply silver alginate to wound bed as instructed Secondary Dressing  Woven Gauze Sponge, Non-Sterile 4x4 in Discharge Instruction: Apply over primary dressing as directed. Zetuvit Plus 4x8 in Discharge Instruction: Apply over primary dressing as directed. ABD Pad, 8x10 Discharge Instruction: Apply over primary dressing as directed. Secured With Compression Wrap ThreePress (3 layer compression wrap) Discharge Instruction: Apply three layer compression as directed. Compression Stockings Add-Ons Electronic Signature(s) Signed: 10/21/2020 5:19:11 PM By: Sandre Kitty Signed: 10/21/2020 6:04:38 PM By: [name] RN Entered By: Sandre Kitty on 10/21/2020 17:06:20 -------------------------------------------------------------------------------- Vitals Details Patient Name: Date of Service: [place], [place] K. 10/21/2020 9:00 A M Medical Record Number: [id number] Patient Account Number: [id number] Date of Birth/Sex: Treating  RN: July 20, 1970 (50 y.o. [name], [name] Primary Care Uzziah Rigg: [name] Other Clinician: Referring Phinehas Grounds: Treating Janifer Gieselman/Extender: Judeth Porch in Treatment: 18 Vital Signs Time Taken: 09:19 Temperature (F): 97.4 Height (in): 62 Pulse (bpm): 79 Weight (lbs): 304 Respiratory Rate (breaths/min): 17 Body Mass Index (BMI): 55.6 Blood Pressure (mmHg): 131/78 Reference Range: 80 - 120 mg / dl Electronic Signature(s) Signed: 10/21/2020 6:04:38 PM By: [name] RN Entered By: [name] on 10/21/2020 09:19:41

## 2020-10-22 ENCOUNTER — Other Ambulatory Visit: Payer: Self-pay | Admitting: Family Medicine

## 2020-10-28 ENCOUNTER — Other Ambulatory Visit: Payer: Self-pay

## 2020-10-28 ENCOUNTER — Encounter (HOSPITAL_BASED_OUTPATIENT_CLINIC_OR_DEPARTMENT_OTHER): Payer: Medicare Other | Admitting: Physician Assistant

## 2020-10-28 DIAGNOSIS — I872 Venous insufficiency (chronic) (peripheral): Secondary | ICD-10-CM | POA: Diagnosis not present

## 2020-10-28 DIAGNOSIS — I87333 Chronic venous hypertension (idiopathic) with ulcer and inflammation of bilateral lower extremity: Secondary | ICD-10-CM | POA: Diagnosis not present

## 2020-10-28 DIAGNOSIS — L97811 Non-pressure chronic ulcer of other part of right lower leg limited to breakdown of skin: Secondary | ICD-10-CM | POA: Diagnosis not present

## 2020-10-28 DIAGNOSIS — L03115 Cellulitis of right lower limb: Secondary | ICD-10-CM | POA: Diagnosis not present

## 2020-10-28 DIAGNOSIS — I890 Lymphedema, not elsewhere classified: Secondary | ICD-10-CM | POA: Diagnosis not present

## 2020-10-28 DIAGNOSIS — L97821 Non-pressure chronic ulcer of other part of left lower leg limited to breakdown of skin: Secondary | ICD-10-CM | POA: Diagnosis not present

## 2020-10-29 NOTE — Progress Notes (Signed)
CHELESEA, WEIAND ([id number]) Visit Report for 10/28/2020 SuperBill Details Patient Name: Date of Service: [place], [place] 10/28/2020 Medical Record Number: [id number] Patient Account Number: [id number] Date of Birth/Sex: Treating RN: 1970/11/09 (50 y.o. 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 Electronic Signature(s) Signed: 10/28/2020 6:55:53 PM By: Worthy Keeler PA-C Signed: 10/29/2020 5:31:56 PM By: Deon Pilling Entered By: Deon Pilling on 10/28/2020 14:32:52

## 2020-10-29 NOTE — Progress Notes (Signed)
[name], [name] ([id number]) Visit Report for 10/28/2020 Arrival Information Details Patient Name: Date of Service: [name], [name] 10/28/2020 10:30 A M Medical Record Number: [id number] Patient Account Number: [id number] Date of Birth/Sex: Treating RN: 01/01/1971 (50 y.o. [name], [name] Primary Care Jettson Crable: [name] Other Clinician: Referring Brysun Eschmann: Treating Catlynn Grondahl/Extender: Marisa Sprinkles in Treatment: 65 Visit Information History Since Last Visit Added or deleted any medications: No Patient Arrived: Ambulatory Any new allergies or adverse reactions: No Arrival Time: 11:59 Had a fall or experienced change in No Accompanied By: self activities of daily living that may affect Transfer Assistance: None risk of falls: Patient Identification Verified: Yes Signs or symptoms of abuse/neglect since last visito No Secondary Verification Process Completed: Yes Hospitalized since last visit: No Patient Requires Transmission-Based Precautions: No Implantable device outside of the clinic excluding No Patient Has Alerts: No cellular tissue based products placed in the center since last visit: Has Dressing in Place as Prescribed: Yes Pain Present Now: No Electronic Signature(s) Signed: 10/28/2020 6:22:58 PM By: [name] RN Entered By: [name] on 10/28/2020 12:02:15 -------------------------------------------------------------------------------- Compression Therapy Details Patient Name: Date of Service: Erica Devoid K. 10/28/2020 10:30 A M Medical Record Number: [id number] Patient Account Number: [id number] Date of Birth/Sex: Treating RN: Jul 20, 1970 (50 y.o. [name], [name] Primary Care Trenten Watchman: [name] Other Clinician: Referring Dorthy Magnussen: Treating Aveen Stansel/Extender: Marisa Sprinkles in Treatment: 19 Compression Therapy Performed for Wound Assessment: Wound #10 Left,Circumferential Lower  Leg Performed By: Clinician [name], RN Compression Type: Three Layer Electronic Signature(s) Signed: 10/28/2020 6:22:58 PM By: [name] RN Entered By: [name] on 10/28/2020 12:04:49 -------------------------------------------------------------------------------- Compression Therapy Details Patient Name: Date of Service: Erica Devoid K. 10/28/2020 10:30 A M Medical Record Number: [id number] Patient Account Number: [id number] Date of Birth/Sex: Treating RN: 1970/05/20 (50 y.o. Benjaman Lobe Primary Care Larae Caison: Other Clinician: [name] Referring Docia Klar: Treating Josian Lanese/Extender: Marisa Sprinkles in Treatment: 19 Compression Therapy Performed for Wound Assessment: Wound #11 Right,Posterior Lower Leg Performed By: Clinician [name], RN Compression Type: Three Layer Electronic Signature(s) Signed: 10/28/2020 6:22:58 PM By: [name] RN Entered By: [name] on 10/28/2020 12:04:49 -------------------------------------------------------------------------------- Compression Therapy Details Patient Name: Date of Service: Erica Devoid K. 10/28/2020 10:30 A M Medical Record Number: [id number] Patient Account Number: [id number] Date of Birth/Sex: Treating RN: 09/15/1970 (50 y.o. [name], [name] Primary Care Tyliah Schlereth: [name] Other Clinician: Referring Ayomikun Starling: Treating Deania Siguenza/Extender: Marisa Sprinkles in Treatment: 19 Compression Therapy Performed for Wound Assessment: Wound #12 Right,Proximal,Anterior Lower Leg Performed By: Clinician [name], RN Compression Type: Three Layer Electronic Signature(s) Signed: 10/28/2020 6:22:58 PM By: [name] RN Entered By: [name] on 10/28/2020 12:04:49 -------------------------------------------------------------------------------- Compression Therapy Details Patient Name: Date of  Service: Erica Devoid K. 10/28/2020 10:30 A M Medical Record Number: [id number] Patient Account Number: [id number] Date of Birth/Sex: Treating RN: 1970/10/02 (50 y.o. [name], [name] Primary Care Meiya Wisler: [name] Other Clinician: Referring Cameryn Schum: Treating Darroll Bredeson/Extender: Marisa Sprinkles in Treatment: 19 Compression Therapy Performed for Wound Assessment: Wound #13 Right,Anterior Lower Leg Performed By: Clinician [name], RN Compression Type: Three Layer Electronic Signature(s) Signed: 10/28/2020 6:22:58 PM By: [name] RN Entered By: [name] on 10/28/2020 12:04:49 -------------------------------------------------------------------------------- Encounter Discharge Information Details Patient Name: Date of Service: Erica Devoid K. 10/28/2020 10:30 A M Medical Record Number: [id number] Patient Account Number: [id number] Date of Birth/Sex: Treating RN:  [birth year] (50 y.o. [name] Primary Care Arnetha Silverthorne: [name] Other Clinician: Referring Jaclyn Carew: Treating Derricka Mertz/Extender: Marisa Sprinkles in Treatment: 19 Encounter Discharge Information Items Discharge Condition: Stable Ambulatory Status: Cane Discharge Destination: Home Transportation: Private Auto Accompanied By: self Schedule Follow-up Appointment: Yes Clinical Summary of Care: Electronic Signature(s) Signed: 10/29/2020 5:31:56 PM By: Deon Pilling Entered By: Deon Pilling on 10/28/2020 14:32:43 -------------------------------------------------------------------------------- Patient/Caregiver Education Details Patient Name: Date of Service: [name] 6/15/2022andnbsp10:30 [place] Record Number: [id number] Patient Account Number: [id number] Date of Birth/Gender: Treating RN: 05/20/1970 (50 y.o. [name], [name] Primary Care Physician: [name] Other Clinician: Referring  Physician: Treating Physician/Extender: Marisa Sprinkles in Treatment: 8 Education Assessment Education Provided To: Patient Education Topics Provided Venous: Methods: Explain/Verbal Responses: State content correctly Electronic Signature(s) Signed: 10/28/2020 6:22:58 PM By: [name] RN Entered By: [name] on 10/28/2020 12:05:51 -------------------------------------------------------------------------------- Wound Assessment Details Patient Name: Date of Service: Erica Devoid K. 10/28/2020 10:30 A M Medical Record Number: [id number] Patient Account Number: [id number] Date of Birth/Sex: Treating RN: [birth year] (50 y.o. [name], [name] Primary Care Elaysha Bevard: [name] Other Clinician: Referring Tome Wilson: Treating Jabori Henegar/Extender: Marisa Sprinkles in Treatment: 19 Wound Status Wound Number: 10 Primary Etiology: Lymphedema Wound Location: Left, Circumferential Lower Leg Wound Status: Open Wounding Event: Gradually Appeared Date Acquired: 09/08/2020 Weeks Of Treatment: 5 Clustered Wound: No Wound Measurements Length: (cm) 9 Width: (cm) 26 Depth: (cm) 0.1 Area: (cm) 183.783 Volume: (cm) 18.378 % Reduction in Area: 30.1% % Reduction in Volume: 30.2% Wound Description Classification: Full Thickness Without Exposed Support Structur es Treatment Notes Wound #10 (Lower Leg) Wound Laterality: Left, Circumferential Cleanser Wound Cleanser Discharge Instruction: Cleanse the wound with wound cleanser prior to applying a clean dressing using gauze sponges, not tissue or cotton balls. Soap and Water Discharge Instruction: May shower and wash wound with dial antibacterial soap and water prior to dressing change. Peri-Wound Care Triamcinolone 15 (g) Discharge Instruction: Use triamcinolone 15 (g) as directed Sween Lotion (Moisturizing lotion) Discharge Instruction: Apply moisturizing lotion as  directed Topical Primary Dressing KerraCel Ag Gelling Fiber Dressing, 4x5 in (silver alginate) Discharge Instruction: Apply silver alginate to wound bed as instructed Secondary Dressing Woven Gauze Sponge, Non-Sterile 4x4 in Discharge Instruction: Apply over primary dressing as directed. Zetuvit Plus 4x8 in Discharge Instruction: Apply over primary dressing as directed. ABD Pad, 8x10 Discharge Instruction: Apply over primary dressing as directed. Secured With Compression Wrap ThreePress (3 layer compression wrap) Discharge Instruction: Apply three layer compression as directed. Compression Stockings Add-Ons Electronic Signature(s) Signed: 10/28/2020 6:22:58 PM By: [name] RN Entered By: [name] on 10/28/2020 12:03:54 -------------------------------------------------------------------------------- Wound Assessment Details Patient Name: Date of Service: Erica Devoid K. 10/28/2020 10:30 A M Medical Record Number: [id number] Patient Account Number: [id number] Date of Birth/Sex: Treating RN: 05-02-1971 (50 y.o. [name], [name] Primary Care Joleah Kosak: [name] Other Clinician: Referring [name] Wolven: Treating Sandar Krinke/Extender: Marisa Sprinkles in Treatment: 19 Wound Status Wound Number: 11 Primary Etiology: Lymphedema Wound Location: Right, Posterior Lower Leg Wound Status: Open Wounding Event: Gradually Appeared Date Acquired: 09/08/2020 Weeks Of Treatment: 5 Clustered Wound: No Wound Measurements Length: (cm) 0.2 Width: (cm) 0.2 Depth: (cm) 0.1 Area: (cm) 0.031 Volume: (cm) 0.003 % Reduction in Area: 99.4% % Reduction in Volume: 99.5% Wound Description Classification: Full Thickness Without Exposed Support Structur es Treatment Notes Wound #11 (Lower Leg) Wound Laterality: Right, Posterior Cleanser Wound Cleanser Discharge Instruction: Cleanse  the wound with wound cleanser prior to applying a clean dressing  using gauze sponges, not tissue or cotton balls. Soap and Water Discharge Instruction: May shower and wash wound with dial antibacterial soap and water prior to dressing change. Peri-Wound Care Triamcinolone 15 (g) Discharge Instruction: Use triamcinolone 15 (g) as directed Sween Lotion (Moisturizing lotion) Discharge Instruction: Apply moisturizing lotion as directed Topical Primary Dressing KerraCel Ag Gelling Fiber Dressing, 4x5 in (silver alginate) Discharge Instruction: Apply silver alginate to wound bed as instructed Secondary Dressing Woven Gauze Sponge, Non-Sterile 4x4 in Discharge Instruction: Apply over primary dressing as directed. Secured With Compression Wrap ThreePress (3 layer compression wrap) Discharge Instruction: Apply three layer compression as directed. Compression Stockings Add-Ons Electronic Signature(s) Signed: 10/28/2020 6:22:58 PM By: [name] RN Entered By: [name] on 10/28/2020 12:03:54 -------------------------------------------------------------------------------- Wound Assessment Details Patient Name: Date of Service: Erica Devoid K. 10/28/2020 10:30 A M Medical Record Number: [id number] Patient Account Number: [id number] Date of Birth/Sex: Treating RN: 1971/02/03 (50 y.o. [name], [name] Primary Care Kataleya Zaugg: [name] Other Clinician: Referring Koehn Salehi: Treating Milus Fritze/Extender: Marisa Sprinkles in Treatment: 19 Wound Status Wound Number: 12 Primary Etiology: Lymphedema Wound Location: Right, Proximal, Anterior Lower Leg Wound Status: Open Wounding Event: Gradually Appeared Date Acquired: 09/08/2020 Weeks Of Treatment: 5 Clustered Wound: No Wound Measurements Length: (cm) 0.2 Width: (cm) 0.2 Depth: (cm) 0.1 Area: (cm) 0.031 Volume: (cm) 0.003 % Reduction in Area: 96.1% % Reduction in Volume: 96.2% Wound Description Classification: Full Thickness Without Exposed Support  Structur es Treatment Notes Wound #12 (Lower Leg) Wound Laterality: Right, Anterior, Proximal Cleanser Wound Cleanser Discharge Instruction: Cleanse the wound with wound cleanser prior to applying a clean dressing using gauze sponges, not tissue or cotton balls. Soap and Water Discharge Instruction: May shower and wash wound with dial antibacterial soap and water prior to dressing change. Peri-Wound Care Triamcinolone 15 (g) Discharge Instruction: Use triamcinolone 15 (g) as directed Sween Lotion (Moisturizing lotion) Discharge Instruction: Apply moisturizing lotion as directed Topical Primary Dressing KerraCel Ag Gelling Fiber Dressing, 4x5 in (silver alginate) Discharge Instruction: Apply silver alginate to wound bed as instructed Secondary Dressing Woven Gauze Sponge, Non-Sterile 4x4 in Discharge Instruction: Apply over primary dressing as directed. Zetuvit Plus 4x8 in Discharge Instruction: Apply over primary dressing as directed. ABD Pad, 8x10 Discharge Instruction: Apply over primary dressing as directed. Secured With Compression Wrap ThreePress (3 layer compression wrap) Discharge Instruction: Apply three layer compression as directed. Compression Stockings Add-Ons Electronic Signature(s) Signed: 10/28/2020 6:22:58 PM By: [name] RN Entered By: [name] on 10/28/2020 12:03:54 -------------------------------------------------------------------------------- Wound Assessment Details Patient Name: Date of Service: Erica Devoid K. 10/28/2020 10:30 A M Medical Record Number: [id number] Patient Account Number: [id number] Date of Birth/Sex: Treating RN: 15-Nov-1970 (50 y.o. [name], [name] Primary Care Jla Reynolds: [name] Other Clinician: Referring Jermiyah Ricotta: Treating Lunden Stieber/Extender: Marisa Sprinkles in Treatment: 19 Wound Status Wound Number: 13 Primary Etiology: Lymphedema Wound Location: Right, Anterior Lower  Leg Wound Status: Open Wounding Event: Gradually Appeared Date Acquired: 09/08/2020 Weeks Of Treatment: 5 Clustered Wound: No Wound Measurements Length: (cm) 0.2 Width: (cm) 0.2 Depth: (cm) 0.1 Area: (cm) 0.031 Volume: (cm) 0.003 % Reduction in Area: 97.8% % Reduction in Volume: 97.8% Wound Description Classification: Full Thickness Without Exposed Support Structur es Treatment Notes Wound #13 (Lower Leg) Wound Laterality: Right, Anterior Cleanser Wound Cleanser Discharge Instruction: Cleanse the wound with wound cleanser prior to applying a clean dressing using gauze sponges, not tissue  or cotton balls. Soap and Water Discharge Instruction: May shower and wash wound with dial antibacterial soap and water prior to dressing change. Peri-Wound Care Triamcinolone 15 (g) Discharge Instruction: Use triamcinolone 15 (g) as directed Sween Lotion (Moisturizing lotion) Discharge Instruction: Apply moisturizing lotion as directed Topical Primary Dressing KerraCel Ag Gelling Fiber Dressing, 4x5 in (silver alginate) Discharge Instruction: Apply silver alginate to wound bed as instructed Secondary Dressing Woven Gauze Sponge, Non-Sterile 4x4 in Discharge Instruction: Apply over primary dressing as directed. Zetuvit Plus 4x8 in Discharge Instruction: Apply over primary dressing as directed. ABD Pad, 8x10 Discharge Instruction: Apply over primary dressing as directed. Secured With Compression Wrap ThreePress (3 layer compression wrap) Discharge Instruction: Apply three layer compression as directed. Compression Stockings Add-Ons Electronic Signature(s) Signed: 10/28/2020 6:22:58 PM By: [name] RN Entered By: [name] on 10/28/2020 12:03:54 -------------------------------------------------------------------------------- Vitals Details Patient Name: Date of Service: Erica Devoid K. 10/28/2020 10:30 A M Medical Record Number: [id number] Patient Account Number:  [id number] Date of Birth/Sex: Treating RN: 03/13/1971 (49 y.o. [name], [name] Primary Care Kaile Bixler: [name] Other Clinician: Referring Anelisse Jacobson: Treating Richrd Kuzniar/Extender: Marisa Sprinkles in Treatment: 19 Vital Signs Time Taken: 12:02 Temperature (F): 97.6 Height (in): 62 Pulse (bpm): 76 Weight (lbs): 304 Respiratory Rate (breaths/min): 17 Body Mass Index (BMI): 55.6 Blood Pressure (mmHg): 105/63 Reference Range: 80 - 120 mg / dl Electronic Signature(s) Signed: 10/28/2020 6:22:58 PM By: [name] RN Entered By: [name] on 10/28/2020 12:03:16

## 2020-10-30 DIAGNOSIS — M300 Polyarteritis nodosa: Principal | ICD-10-CM

## 2020-10-30 MED ORDER — PENTOXIFYLLINE ER 400 MG TABLET,EXTENDED RELEASE
ORAL_TABLET | Freq: Three times a day (TID) | ORAL | 3 refills | 90.00000 days | Status: CP
Start: 2020-10-30 — End: ?

## 2020-10-30 NOTE — Unmapped (Signed)
[facility] refill  Last Visit Date: 10/08/2020  Next Visit Date: 03/11/2021    Lab Results   Component Value Date    ALT 8 (L) 07/31/2020    AST 17 07/31/2020    ALBUMIN 3.5 07/31/2020    CREATININE 0.59 (L) 07/31/2020     Lab Results   Component Value Date    WBC 6.9 07/31/2020    HGB 10.9 (L) 07/31/2020    HCT 32.6 (L) 07/31/2020    PLT 324 07/31/2020     Lab Results   Component Value Date    NEUTROPCT 63.1 07/31/2020    LYMPHOPCT 18.6 07/31/2020    MONOPCT 9.5 07/31/2020    EOSPCT 7.8 07/31/2020    BASOPCT 1.0 07/31/2020

## 2020-11-04 ENCOUNTER — Encounter (HOSPITAL_BASED_OUTPATIENT_CLINIC_OR_DEPARTMENT_OTHER): Payer: Medicare Other | Admitting: Internal Medicine

## 2020-11-04 ENCOUNTER — Encounter: Payer: Self-pay | Admitting: Family Medicine

## 2020-11-05 ENCOUNTER — Encounter: Payer: Self-pay | Admitting: Family Medicine

## 2020-11-05 ENCOUNTER — Ambulatory Visit: Payer: Medicare Other | Admitting: Family Medicine

## 2020-11-06 ENCOUNTER — Other Ambulatory Visit: Payer: Self-pay

## 2020-11-06 ENCOUNTER — Encounter (HOSPITAL_BASED_OUTPATIENT_CLINIC_OR_DEPARTMENT_OTHER): Payer: Medicare Other | Admitting: Internal Medicine

## 2020-11-06 DIAGNOSIS — I87333 Chronic venous hypertension (idiopathic) with ulcer and inflammation of bilateral lower extremity: Secondary | ICD-10-CM | POA: Diagnosis not present

## 2020-11-06 DIAGNOSIS — L97821 Non-pressure chronic ulcer of other part of left lower leg limited to breakdown of skin: Secondary | ICD-10-CM | POA: Diagnosis not present

## 2020-11-06 DIAGNOSIS — L97822 Non-pressure chronic ulcer of other part of left lower leg with fat layer exposed: Secondary | ICD-10-CM | POA: Diagnosis not present

## 2020-11-06 DIAGNOSIS — L97219 Non-pressure chronic ulcer of right calf with unspecified severity: Secondary | ICD-10-CM | POA: Diagnosis not present

## 2020-11-06 DIAGNOSIS — I890 Lymphedema, not elsewhere classified: Secondary | ICD-10-CM | POA: Diagnosis not present

## 2020-11-06 DIAGNOSIS — I872 Venous insufficiency (chronic) (peripheral): Secondary | ICD-10-CM | POA: Diagnosis not present

## 2020-11-06 DIAGNOSIS — L97811 Non-pressure chronic ulcer of other part of right lower leg limited to breakdown of skin: Secondary | ICD-10-CM | POA: Diagnosis not present

## 2020-11-06 DIAGNOSIS — L03115 Cellulitis of right lower limb: Secondary | ICD-10-CM | POA: Diagnosis not present

## 2020-11-06 NOTE — Progress Notes (Signed)
[name], [name] ([id number]) Visit Report for 11/06/2020 Arrival Information Details Patient Name: Date of Service: [name], [name] 11/06/2020 12:45 PM Medical Record Number: [id number] Patient Account Number: [id number] Date of Birth/Sex: Treating RN: [birth year] (50 y.o. Martyn Malay, [name] Primary Care Furman Trentman: [name] Other Clinician: Referring Mearl Harewood: Treating Zariana Strub/Extender: [name] in Treatment: 20 Visit Information History Since Last Visit Added or deleted any medications: No Patient Arrived: Kasandra Knudsen Any new allergies or adverse reactions: No Arrival Time: 13:02 Had a fall or experienced change in No Accompanied By: self activities of daily living that may affect Transfer Assistance: Manual risk of falls: Patient Identification Verified: Yes Signs or symptoms of abuse/neglect since last visito No Secondary Verification Process Completed: Yes Hospitalized since last visit: No Patient Requires Transmission-Based Precautions: No Implantable device outside of the clinic excluding No Patient Has Alerts: No cellular tissue based products placed in the center since last visit: Has Dressing in Place as Prescribed: Yes Pain Present Now: Yes Electronic Signature(s) Signed: 11/06/2020 5:07:58 PM By: Sandre Kitty Entered By: Sandre Kitty on 11/06/2020 13:05:14 -------------------------------------------------------------------------------- Compression Therapy Details Patient Name: Date of Service: Erica SYRAH, DAUGHTREY 11/06/2020 12:45 PM Medical Record Number: [id number] Patient Account Number: [id number] Date of Birth/Sex: Treating RN: 1971-03-27 (50 y.o. [name] Primary Care Fabian Walder: [name] Other Clinician: Referring Beverly Ferner: Treating Cedrik Heindl/Extender: [name] in Treatment: 20 Compression Therapy Performed for Wound Assessment: Wound #10 Left,Lateral Lower Leg Performed By:  Clinician Deon Pilling, RN Compression Type: Four Layer Post Procedure Diagnosis Same as Pre-procedure Electronic Signature(s) Signed: 11/06/2020 6:17:32 PM By: Baruch Gouty RN, BSN Entered By: Baruch Gouty on 11/06/2020 13:28:37 -------------------------------------------------------------------------------- Compression Therapy Details Patient Name: Date of Service: Erica Gong. 11/06/2020 12:45 PM Medical Record Number: [id number] Patient Account Number: [id number] Date of Birth/Sex: Treating RN: 1971-01-01 (50 y.o. [name] Primary Care Suha Schoenbeck: [name] Other Clinician: Referring Alastair Hennes: Treating Finch Costanzo/Extender: [name] in Treatment: 20 Compression Therapy Performed for Wound Assessment: Wound #11 Right,Posterior Lower Leg Performed By: Clinician Deon Pilling, RN Compression Type: Four Layer Post Procedure Diagnosis Same as Pre-procedure Electronic Signature(s) Signed: 11/06/2020 6:17:32 PM By: Baruch Gouty RN, BSN Entered By: Baruch Gouty on 11/06/2020 13:28:37 -------------------------------------------------------------------------------- Encounter Discharge Information Details Patient Name: Date of Service: Erica Devoid K. 11/06/2020 12:45 PM Medical Record Number: [id number] Patient Account Number: [id number] Date of Birth/Sex: Treating RN: Jun 27, 1970 (50 y.o. [name] Primary Care Wen Munford: [name] Other Clinician: Referring Michaelene Dutan: Treating Katti Pelle/Extender: [name] in Treatment: 20 Encounter Discharge Information Items Discharge Condition: Stable Ambulatory Status: Cane Discharge Destination: Home Transportation: Private Auto Accompanied By: self Schedule Follow-up Appointment: Yes Clinical Summary of Care: Electronic Signature(s) Signed: 11/06/2020 6:02:43 PM By: Deon Pilling Entered By: Deon Pilling on 11/06/2020  17:52:57 -------------------------------------------------------------------------------- Lower Extremity Assessment Details Patient Name: Date of Service: [name], [name] 11/06/2020 12:45 PM Medical Record Number: [id number] Patient Account Number: [id number] Date of Birth/Sex: Treating RN: [birth year] (50 y.o. [name], Meta.Reding Primary Care Manaia Samad: [name] Other Clinician: Referring Rayma Hegg: Treating Daniyah Fohl/Extender: [name] in Treatment: 20 Edema Assessment Assessed: Shirlyn Goltz: Yes] Patrice Paradise: Yes] Edema: [Left: Yes] [Right: Yes] Calf Left: Right: Point of Measurement: 28 cm From Medial Instep 52 cm 50 cm Ankle Left: Right: Point of Measurement: 11 cm From Medial Instep 29 cm 31 cm Notes right leg and foot edematous, red, and hot to touch. Electronic Signature(s) Signed: 11/06/2020 6:02:43 PM  By: Deon Pilling Entered By: Deon Pilling on 11/06/2020 13:13:03 -------------------------------------------------------------------------------- Multi Wound Chart Details Patient Name: Date of Service: [name], [name] 11/06/2020 12:45 PM Medical Record Number: [id number] Patient Account Number: [id number] Date of Birth/Sex: Treating RN: August 06, 1970 (51 y.o. Martyn Malay, [name] Primary Care Pelagia Iacobucci: [name] Other Clinician: Referring Renee Beale: Treating Christ Fullenwider/Extender: [name] in Treatment: 20 Vital Signs Height(in): 62 Pulse(bpm): 73 Weight(lbs): 304 Blood Pressure(mmHg): 118/74 Body Mass Index(BMI): 56 Temperature(F): 98.1 Respiratory Rate(breaths/min): 17 Photos: [10:No Photos Left, Lateral Lower Leg] [11:No Photos Right, Posterior Lower Leg] [12:No Photos Right, Proximal, Anterior Lower Leg] Wound Location: [10:Gradually Appeared] [11:Gradually Appeared] [12:Gradually Appeared] Wounding Event: [10:Lymphedema] [11:Lymphedema] [12:Lymphedema] Primary Etiology: [10:Lymphedema, Sleep Apnea,]  [11:Lymphedema, Sleep Apnea,] [12:Lymphedema, Sleep Apnea,] Comorbid History: [10:Hypertension, Peripheral Venous Disease, Osteoarthritis, Confinement Disease, Osteoarthritis, Confinement Anxiety 09/08/2020] [11:Hypertension, Peripheral Venous Anxiety 09/08/2020] [12:Hypertension, Peripheral Venous Disease,  Osteoarthritis, Confinement Anxiety 09/08/2020] Date Acquired: [10:6] [11:6] [12:6] Weeks of Treatment: [10:Open] [11:Open] [12:Open] Wound Status: [10:0.4x0.2x0.1] [11:0.1x0.1x0.1] [12:0.5x0.6x0.1] Measurements L x W x D (cm) [10:0.063] [11:0.008] [12:0.236] A (cm) : rea [10:0.006] [11:0.001] [12:0.024] Volume (cm) : [10:100.00%] [11:99.90%] [12:69.90%] % Reduction in Area: [10:100.00%] [11:99.80%] [12:69.60%] % Reduction in Volume: [10:Full Thickness Without Exposed] [11:Full Thickness Without Exposed] [12:Full Thickness Without Exposed] Classification: [10:Support Structures Medium] [11:Support Structures None Present] [12:Support Structures Large] Exudate Amount: [10:Serosanguineous] [11:N/A] [12:Serous] Exudate Type: [10:red, brown] [11:N/A] [12:amber] Exudate Color: [10:Distinct, outline attached] [11:Distinct, outline attached] [12:Distinct, outline attached] Wound Margin: [10:Large (67-100%)] [11:None Present (0%)] [12:Large (67-100%)] Granulation Amount: [10:Red] [11:N/A] [12:Red] Granulation Quality: [10:None Present (0%)] [11:None Present (0%)] [12:None Present (0%)] Necrotic Amount: [10:Fat Layer (Subcutaneous Tissue): Yes Fascia: No] [12:Fat Layer (Subcutaneous Tissue): Yes] Exposed Structures: [10:Fascia: No Tendon: No Muscle: No Joint: No Bone: No Large (67-100%)] [11:Fat Layer (Subcutaneous Tissue): No Tendon: No Muscle: No Joint: No Bone: No None] [12:Fascia: No Tendon: No Muscle: No Joint: No Bone: No None] Epithelialization: [10:Compression Therapy] [11:Compression Therapy] [12:N/A] Wound Number: 13 N/A N/A Photos: No Photos N/A N/A Right, Anterior Lower Leg N/A  N/A Wound Location: Gradually Appeared N/A N/A Wounding Event: Lymphedema N/A N/A Primary Etiology: Lymphedema, Sleep Apnea, N/A N/A Comorbid History: Hypertension, Peripheral Venous Disease, Osteoarthritis, Confinement Anxiety 09/08/2020 N/A N/A Date Acquired: 6 N/A N/A Weeks of Treatment: Open N/A N/A Wound Status: 1x0.4x0.1 N/A N/A Measurements L x W x D (cm) 0.314 N/A N/A A (cm) : rea 0.031 N/A N/A Volume (cm) : 77.30% N/A N/A % Reduction in Area: 77.50% N/A N/A % Reduction in Volume: Full Thickness Without Exposed N/A N/A Classification: Support Structures Medium N/A N/A Exudate Amount: Serosanguineous N/A N/A Exudate Type: red, brown N/A N/A Exudate Color: Distinct, outline attached N/A N/A Wound Margin: Large (67-100%) N/A N/A Granulation Amount: Red N/A N/A Granulation Quality: None Present (0%) N/A N/A Necrotic Amount: Fat Layer (Subcutaneous Tissue): Yes N/A N/A Exposed Structures: Fascia: No Tendon: No Muscle: No Joint: No Bone: No Large (67-100%) N/A N/A Epithelialization: N/A N/A N/A Procedures Performed: Treatment Notes Electronic Signature(s) Signed: 11/06/2020 5:20:40 PM By: [name] MD Signed: 11/06/2020 6:17:32 PM By: Baruch Gouty RN, BSN Entered By: [name] on 11/06/2020 13:31:43 -------------------------------------------------------------------------------- Multi-Disciplinary Care Plan Details Patient Name: Date of Service: Erica Devoid K. 11/06/2020 12:45 PM Medical Record Number: [id number] Patient Account Number: [id number] Date of Birth/Sex: Treating RN: May 18, 1970 (50 y.o. [name] Primary Care Ahmiyah Coil: [name] Other Clinician: Referring Krupa Stege: Treating Lathaniel Legate/Extender: [name] in Treatment: 20 Active Inactive Abuse / Safety /  Falls / Self Care Management Nursing Diagnoses: Potential for falls Goals: Patient will remain injury free related  to falls Date Initiated: 09/22/2020 Target Resolution Date: 11/20/2020 Goal Status: Active Interventions: Assess fall risk on admission and as needed Assess: immobility, friction, shearing, incontinence upon admission and as needed Assess self care needs on admission and as needed Provide education on fall prevention Notes: Venous Leg Ulcer Nursing Diagnoses: Potential for venous Insuffiency (use before diagnosis confirmed) Goals: Patient will maintain optimal edema control Date Initiated: 09/22/2020 Target Resolution Date: 11/13/2020 Goal Status: Active Interventions: Assess peripheral edema status every visit. Compression as ordered Provide education on venous insufficiency Notes: Electronic Signature(s) Signed: 11/06/2020 6:17:32 PM By: Baruch Gouty RN, BSN Entered By: Baruch Gouty on 11/06/2020 13:41:38 -------------------------------------------------------------------------------- Pain Assessment Details Patient Name: Date of Service: Erica Gong. 11/06/2020 12:45 PM Medical Record Number: [id number] Patient Account Number: [id number] Date of Birth/Sex: Treating RN: Sep 18, 1970 (50 y.o. [name] Primary Care Milderd Manocchio: [name] Other Clinician: Referring Everly Rubalcava: Treating Consuela Widener/Extender: [name] in Treatment: 20 Active Problems Location of Pain Severity and Description of Pain Patient Has Paino Yes Site Locations Rate the pain. Current Pain Level: 3 Pain Management and Medication Current Pain Management: Electronic Signature(s) Signed: 11/06/2020 5:07:58 PM By: Sandre Kitty Signed: 11/06/2020 6:17:32 PM By: Baruch Gouty RN, BSN Entered By: Sandre Kitty on 11/06/2020 13:05:55 -------------------------------------------------------------------------------- Patient/Caregiver Education Details Patient Name: Date of Service: Erica Gong 6/24/2022andnbsp12:45 PM Medical Record Number:  [id number] Patient Account Number: [id number] Date of Birth/Gender: Treating RN: 1971-02-21 (50 y.o. [name] Primary Care Physician: [name] Other Clinician: Referring Physician: Treating Physician/Extender: [name] in Treatment: 20 Education Assessment Education Provided To: Patient Education Topics Provided Infection: Methods: Explain/Verbal Responses: Reinforcements needed, State content correctly Venous: Methods: Explain/Verbal Responses: Reinforcements needed, State content correctly Electronic Signature(s) Signed: 11/06/2020 6:17:32 PM By: Baruch Gouty RN, BSN Entered By: Baruch Gouty on 11/06/2020 13:42:07 -------------------------------------------------------------------------------- Wound Assessment Details Patient Name: Date of Service: Erica Gong. 11/06/2020 12:45 PM Medical Record Number: [id number] Patient Account Number: [id number] Date of Birth/Sex: Treating RN: [birth year] (50 y.o. [name], Meta.Reding Primary Care Marshal Schrecengost: [name] Other Clinician: Referring Aurelie Dicenzo: Treating Agastya Meister/Extender: [name] in Treatment: 20 Wound Status Wound Number: 10 Primary Lymphedema Etiology: Wound Location: Left, Lateral Lower Leg Wound Open Wounding Event: Gradually Appeared Status: Date Acquired: 09/08/2020 Comorbid Lymphedema, Sleep Apnea, Hypertension, Peripheral Venous Weeks Of Treatment: 6 History: Disease, Osteoarthritis, Confinement Anxiety Clustered Wound: No Photos Wound Measurements Length: (cm) 0.4 Width: (cm) 0.2 Depth: (cm) 0.1 Area: (cm) 0.063 Volume: (cm) 0.006 % Reduction in Area: 100% % Reduction in Volume: 100% Epithelialization: Large (67-100%) Tunneling: No Undermining: No Wound Description Classification: Full Thickness Without Exposed Support Structures Wound Margin: Distinct, outline attached Exudate Amount: Medium Exudate Type:  Serosanguineous Exudate Color: red, brown Foul Odor After Cleansing: No Slough/Fibrino No Wound Bed Granulation Amount: Large (67-100%) Exposed Structure Granulation Quality: Red Fascia Exposed: No Necrotic Amount: None Present (0%) Fat Layer (Subcutaneous Tissue) Exposed: Yes Tendon Exposed: No Muscle Exposed: No Joint Exposed: No Bone Exposed: No Treatment Notes Wound #10 (Lower Leg) Wound Laterality: Left, Lateral Cleanser Soap and Water Discharge Instruction: May shower and wash wound with dial antibacterial soap and water prior to dressing change. Wound Cleanser Discharge Instruction: Cleanse the wound with wound cleanser prior to applying a clean dressing using gauze sponges, not tissue or cotton balls. Peri-Wound Care Triamcinolone 15 (g) Discharge Instruction: Use  triamcinolone 15 (g) as directed Sween Lotion (Moisturizing lotion) Discharge Instruction: Apply moisturizing lotion as directed Topical Primary Dressing KerraCel Ag Gelling Fiber Dressing, 4x5 in (silver alginate) Discharge Instruction: Apply silver alginate to wound bed as instructed Secondary Dressing Woven Gauze Sponge, Non-Sterile 4x4 in Discharge Instruction: Apply over primary dressing as directed. ABD Pad, 8x10 Discharge Instruction: Apply over primary dressing as directed. Zetuvit Plus 4x8 in Discharge Instruction: Apply over primary dressing as directed. Secured With Compression Wrap FourPress (4 layer compression wrap) Discharge Instruction: Apply four layer compression as directed. May also use Miliken CoFlex 2 layer compression system as alternative. Unnaboot w/Calamine, 4x10 (in/yd) Discharge Instruction: Apply Unnaboot at top to secure Compression Stockings Add-Ons Electronic Signature(s) Signed: 11/06/2020 5:07:58 PM By: Sandre Kitty Signed: 11/06/2020 6:02:43 PM By: Deon Pilling Entered By: Sandre Kitty on 11/06/2020  17:03:26 -------------------------------------------------------------------------------- Wound Assessment Details Patient Name: Date of Service: Erica Gong. 11/06/2020 12:45 PM Medical Record Number: [id number] Patient Account Number: [id number] Date of Birth/Sex: Treating RN: 27-Dec-1970 (50 y.o. [name], Meta.Reding Primary Care Towana Stenglein: [name] Other Clinician: Referring Milina Pagett: Treating Rihan Schueler/Extender: [name] in Treatment: 20 Wound Status Wound Number: 11 Primary Lymphedema Etiology: Wound Location: Right, Posterior Lower Leg Wound Open Wounding Event: Gradually Appeared Status: Date Acquired: 09/08/2020 Comorbid Lymphedema, Sleep Apnea, Hypertension, Peripheral Venous Weeks Of Treatment: 6 History: Disease, Osteoarthritis, Confinement Anxiety Clustered Wound: No Photos Wound Measurements Length: (cm) 0.1 Width: (cm) 0.1 Depth: (cm) 0.1 Area: (cm) 0.008 Volume: (cm) 0.001 % Reduction in Area: 99.9% % Reduction in Volume: 99.8% Epithelialization: None Tunneling: No Undermining: No Wound Description Classification: Full Thickness Without Exposed Support Structures Wound Margin: Distinct, outline attached Exudate Amount: None Present Foul Odor After Cleansing: No Slough/Fibrino No Wound Bed Granulation Amount: None Present (0%) Exposed Structure Necrotic Amount: None Present (0%) Fascia Exposed: No Fat Layer (Subcutaneous Tissue) Exposed: No Tendon Exposed: No Muscle Exposed: No Joint Exposed: No Bone Exposed: No Treatment Notes Wound #11 (Lower Leg) Wound Laterality: Right, Posterior Cleanser Soap and Water Discharge Instruction: May shower and wash wound with dial antibacterial soap and water prior to dressing change. Wound Cleanser Discharge Instruction: Cleanse the wound with wound cleanser prior to applying a clean dressing using gauze sponges, not tissue or cotton balls. Peri-Wound Care Triamcinolone  15 (g) Discharge Instruction: Use triamcinolone 15 (g) as directed Sween Lotion (Moisturizing lotion) Discharge Instruction: Apply moisturizing lotion as directed Topical Primary Dressing KerraCel Ag Gelling Fiber Dressing, 4x5 in (silver alginate) Discharge Instruction: Apply silver alginate to wound bed as instructed Secondary Dressing Woven Gauze Sponge, Non-Sterile 4x4 in Discharge Instruction: Apply over primary dressing as directed. ABD Pad, 8x10 Discharge Instruction: Apply over primary dressing as directed. Zetuvit Plus 4x8 in Discharge Instruction: Apply over primary dressing as directed. Secured With Compression Wrap FourPress (4 layer compression wrap) Discharge Instruction: Apply four layer compression as directed. May also use Miliken CoFlex 2 layer compression system as alternative. Unnaboot w/Calamine, 4x10 (in/yd) Discharge Instruction: Apply Unnaboot at top to secure Compression Stockings Add-Ons Electronic Signature(s) Signed: 11/06/2020 5:07:58 PM By: Sandre Kitty Signed: 11/06/2020 6:02:43 PM By: Deon Pilling Entered By: Sandre Kitty on 11/06/2020 17:04:22 -------------------------------------------------------------------------------- Wound Assessment Details Patient Name: Date of Service: Erica Gong. 11/06/2020 12:45 PM Medical Record Number: [id number] Patient Account Number: [id number] Date of Birth/Sex: Treating RN: 1970-10-13 (50 y.o. [name] Primary Care Darrian Goodwill: [name] Other Clinician: Referring Malloree Raboin: Treating Gurtaj Ruz/Extender: [name] in Treatment: 20 Wound Status Wound Number:  12 Primary Lymphedema Etiology: Wound Location: Right, Proximal, Anterior Lower Leg Wound Open Wounding Event: Gradually Appeared Status: Date Acquired: 09/08/2020 Comorbid Lymphedema, Sleep Apnea, Hypertension, Peripheral Venous Weeks Of Treatment: 6 History: Disease, Osteoarthritis, Confinement  Anxiety Clustered Wound: No Photos Wound Measurements Length: (cm) 0.5 Width: (cm) 0.6 Depth: (cm) 0.1 Area: (cm) 0.236 Volume: (cm) 0.024 % Reduction in Area: 69.9% % Reduction in Volume: 69.6% Epithelialization: None Tunneling: No Undermining: No Wound Description Classification: Full Thickness Without Exposed Support Structures Wound Margin: Distinct, outline attached Exudate Amount: Large Exudate Type: Serous Exudate Color: amber Foul Odor After Cleansing: No Slough/Fibrino No Wound Bed Granulation Amount: Large (67-100%) Exposed Structure Granulation Quality: Red Fascia Exposed: No Necrotic Amount: None Present (0%) Fat Layer (Subcutaneous Tissue) Exposed: Yes Tendon Exposed: No Muscle Exposed: No Joint Exposed: No Bone Exposed: No Treatment Notes Wound #12 (Lower Leg) Wound Laterality: Right, Anterior, Proximal Cleanser Soap and Water Discharge Instruction: May shower and wash wound with dial antibacterial soap and water prior to dressing change. Wound Cleanser Discharge Instruction: Cleanse the wound with wound cleanser prior to applying a clean dressing using gauze sponges, not tissue or cotton balls. Peri-Wound Care Triamcinolone 15 (g) Discharge Instruction: Use triamcinolone 15 (g) as directed Sween Lotion (Moisturizing lotion) Discharge Instruction: Apply moisturizing lotion as directed Topical Primary Dressing KerraCel Ag Gelling Fiber Dressing, 4x5 in (silver alginate) Discharge Instruction: Apply silver alginate to wound bed as instructed Secondary Dressing Woven Gauze Sponge, Non-Sterile 4x4 in Discharge Instruction: Apply over primary dressing as directed. ABD Pad, 8x10 Discharge Instruction: Apply over primary dressing as directed. Zetuvit Plus 4x8 in Discharge Instruction: Apply over primary dressing as directed. Secured With Compression Wrap FourPress (4 layer compression wrap) Discharge Instruction: Apply four layer compression as  directed. May also use Miliken CoFlex 2 layer compression system as alternative. Unnaboot w/Calamine, 4x10 (in/yd) Discharge Instruction: Apply Unnaboot at top to secure Compression Stockings Add-Ons Electronic Signature(s) Signed: 11/06/2020 5:07:58 PM By: Sandre Kitty Signed: 11/06/2020 6:02:43 PM By: Deon Pilling Entered By: Sandre Kitty on 11/06/2020 17:03:48 -------------------------------------------------------------------------------- Wound Assessment Details Patient Name: Date of Service: Erica Gong. 11/06/2020 12:45 PM Medical Record Number: [id number] Patient Account Number: [id number] Date of Birth/Sex: Treating RN: [birth year] (50 y.o. [name], Meta.Reding Primary Care Nyle Limb: [name] Other Clinician: Referring Yuji Walth: Treating Annamae Shivley/Extender: [name] in Treatment: 20 Wound Status Wound Number: 13 Primary Lymphedema Etiology: Wound Location: Right, Anterior Lower Leg Wound Open Wounding Event: Gradually Appeared Status: Date Acquired: 09/08/2020 Comorbid Lymphedema, Sleep Apnea, Hypertension, Peripheral Venous Weeks Of Treatment: 6 History: Disease, Osteoarthritis, Confinement Anxiety Clustered Wound: No Photos Wound Measurements Length: (cm) 1 Width: (cm) 0.4 Depth: (cm) 0.1 Area: (cm) 0.314 Volume: (cm) 0.031 % Reduction in Area: 77.3% % Reduction in Volume: 77.5% Epithelialization: Large (67-100%) Tunneling: No Undermining: No Wound Description Classification: Full Thickness Without Exposed Support Structures Wound Margin: Distinct, outline attached Exudate Amount: Medium Exudate Type: Serosanguineous Exudate Color: red, brown Foul Odor After Cleansing: No Slough/Fibrino No Wound Bed Granulation Amount: Large (67-100%) Exposed Structure Granulation Quality: Red Fascia Exposed: No Necrotic Amount: None Present (0%) Fat Layer (Subcutaneous Tissue) Exposed: Yes Tendon Exposed: No Muscle  Exposed: No Joint Exposed: No Bone Exposed: No Treatment Notes Wound #13 (Lower Leg) Wound Laterality: Right, Anterior Cleanser Soap and Water Discharge Instruction: May shower and wash wound with dial antibacterial soap and water prior to dressing change. Wound Cleanser Discharge Instruction: Cleanse the wound with wound cleanser prior to applying a clean dressing using gauze  sponges, not tissue or cotton balls. Peri-Wound Care Triamcinolone 15 (g) Discharge Instruction: Use triamcinolone 15 (g) as directed Sween Lotion (Moisturizing lotion) Discharge Instruction: Apply moisturizing lotion as directed Topical Primary Dressing KerraCel Ag Gelling Fiber Dressing, 4x5 in (silver alginate) Discharge Instruction: Apply silver alginate to wound bed as instructed Secondary Dressing Woven Gauze Sponge, Non-Sterile 4x4 in Discharge Instruction: Apply over primary dressing as directed. ABD Pad, 8x10 Discharge Instruction: Apply over primary dressing as directed. Zetuvit Plus 4x8 in Discharge Instruction: Apply over primary dressing as directed. Secured With Compression Wrap FourPress (4 layer compression wrap) Discharge Instruction: Apply four layer compression as directed. May also use Miliken CoFlex 2 layer compression system as alternative. Unnaboot w/Calamine, 4x10 (in/yd) Discharge Instruction: Apply Unnaboot at top to secure Compression Stockings Add-Ons Electronic Signature(s) Signed: 11/06/2020 5:07:58 PM By: Sandre Kitty Signed: 11/06/2020 6:02:43 PM By: Deon Pilling Entered By: Sandre Kitty on 11/06/2020 17:04:07 -------------------------------------------------------------------------------- Vitals Details Patient Name: Date of Service: Erica Gong. 11/06/2020 12:45 PM Medical Record Number: [id number] Patient Account Number: [id number] Date of Birth/Sex: Treating RN: Apr 29, 1971 (50 y.o. [name] Primary Care Gracelee Stemmler: [name] Other  Clinician: Referring Emslee Lopezmartinez: Treating Lennin Osmond/Extender: [name] in Treatment: 20 Vital Signs Time Taken: 13:05 Temperature (F): 98.1 Height (in): 62 Pulse (bpm): 73 Weight (lbs): 304 Respiratory Rate (breaths/min): 17 Body Mass Index (BMI): 55.6 Blood Pressure (mmHg): 118/74 Reference Range: 80 - 120 mg / dl Electronic Signature(s) Signed: 11/06/2020 5:07:58 PM By: Sandre Kitty Entered By: Sandre Kitty on 11/06/2020 13:05:40

## 2020-11-06 NOTE — Progress Notes (Signed)
[name] ([id number]) Visit Report for 11/06/2020 HPI Details Patient Name: Date of Service: [name] 11/06/2020 12:45 PM Medical Record Number: [id number] Patient Account Number: [id number] Date of Birth/Sex: Treating RN: December 30, 1970 (50 y.o. [name] Primary Care Provider: [name] Other Clinician: Referring Provider: Treating Provider/Extender: [name] in Treatment: 54 History of Present Illness HPI Description: [name] is a 50 year old female with a past medical history of psoriasis, hyperlipidemia, chronic lower extremity edema as described, cutaneous polyarteritis nodosa, sleep apnea, fibromyalgia, and osteoarthritis of both knees. She was recently followed in our clinic in February for her lower extremity wounds due to chronic venous insufficiency and lymphedema. She was treated with compression wraps and Silver alginate And occasionally with Hydrofera Blue. These closed after a couple months of treatment. During that time she received juxta lite compressions. She was ordered lymphedema pumps to but these had not arrived prior to her wound closure. Today she presents with reopening of her lower extremity wounds. She has still not received her lymphedema pumps. She states she has been using juxta lite compressions daily. She denies acute signs of infection. 5/27; patient presents for follow-up. She was last seen 2 weeks ago. She could not make it to her last week appointment due to feeling sick. She left the wraps on for over a week and now is feeling increased pain to the lower extremities. She took them off 5 days ago and has noticed increased swelling. She has tried using her juxta light compression. 6/8; patient presents for 1 week follow-up. She finished Keflex and states that the pain in her legs has resolved and she feels much better. She has tolerated 3 layer compression with silver alginate. She denies signs of  infection. 6/15; the patient I do not usually follow-up. She has severe stage III lymphedema. She took her wraps off 3 days ago because she was soaking through. She called for an earlier appointment however we could not fit her in. She has several small areas mostly on the right lateral with weeping edema fluid. She also notes that she was bitten by an insect on the posterior leg. She now has a lot of erythema mostly in the dorsal foot and ankle. Warm and tender. I note that she only recently seems to have finished Keflex Electronic Signature(s) Signed: 11/06/2020 5:20:40 PM By: [name] MD Entered By: [name] on 11/06/2020 13:33:15 -------------------------------------------------------------------------------- Physical Exam Details Patient Name: Date of Service: Erica Devoid K. 11/06/2020 12:45 PM Medical Record Number: [id number] Patient Account Number: [id number] Date of Birth/Sex: Treating RN: 20-Apr-1971 (50 y.o. [name] Primary Care Provider: [name] Other Clinician: Referring Provider: Treating Provider/Extender: [name] in Treatment: 20 Constitutional Sitting or standing Blood Pressure is within target range for patient.. Pulse regular and within target range for patient.Marland Kitchen Respirations regular, non-labored and within target range.. Temperature is normal and within the target range for the patient.Marland Kitchen Appears in no distress. Cardiovascular Pedal pulses are palpable bilaterally. Edema present in both extremities. This is nonpitting. Stage III lymphedema.. Integumentary (Hair, Skin) Erythema on the dorsal right foot extending into the ankle which is warm and somewhat tender.. Notes Wound exam; severe bilateral lymphedema. She has weeping areas especially on the right lateral tibial area. Also areas on the left anterior right anterior. Electronic Signature(s) Signed: 11/06/2020 5:20:40 PM By: [name] MD Entered  By: [name] on 11/06/2020 13:37:13 -------------------------------------------------------------------------------- Physician Orders Details Patient Name: Date of Service:  Erica [name], [name] 11/06/2020 12:45 PM Medical Record Number: [id number] Patient Account Number: [id number] Date of Birth/Sex: Treating RN: 09-15-1970 (50 y.o. [name] Primary Care Provider: [name] Other Clinician: Referring Provider: Treating Provider/Extender: [name] in Treatment: 20 Verbal / Phone Orders: No Diagnosis Coding ICD-10 Coding Code Description [phone] Chronic venous hypertension (idiopathic) with ulcer and inflammation of bilateral lower extremity L97.821 Non-pressure chronic ulcer of other part of left lower leg limited to breakdown of skin L97.811 Non-pressure chronic ulcer of other part of right lower leg limited to breakdown of skin I89.0 Lymphedema, not elsewhere classified M30.0 Polyarteritis nodosa Follow-up Appointments ppointment in 1 week. - Dr. Heber [place] Return A Nurse Visit: [place] or [place] for RN visit Bathing/ Shower/ Hygiene May shower with protection but do not get wound dressing(s) wet. - May use cast protectors...you can find those at [facility] or [facility] Edema Control - Lymphedema / SCD / Other Lymphedema Pumps. Use Lymphedema pumps on leg(s) 2-3 times a day for 45-60 minutes. If wearing any wraps or hose, do not remove them. Continue exercising as instructed. - Keep phone line open for [employer] to schedule an appointment to set up pumps at home. Elevate legs to the level of the heart or above for 30 minutes daily and/or when sitting, a frequency of: Avoid standing for long periods of time. Exercise regularly Additional Orders / Instructions Other: - start taking antibiotic as prescribed Wound Treatment Wound #10 - Lower Leg Wound Laterality: Left, Lateral Cleanser: Soap and Water 1 x Per Week/30 Days Discharge Instructions:  May shower and wash wound with dial antibacterial soap and water prior to dressing change. Cleanser: Wound Cleanser 1 x Per Week/30 Days Discharge Instructions: Cleanse the wound with wound cleanser prior to applying a clean dressing using gauze sponges, not tissue or cotton balls. Peri-Wound Care: Triamcinolone 15 (g) 1 x Per Week/30 Days Discharge Instructions: Use triamcinolone 15 (g) as directed Peri-Wound Care: Sween Lotion (Moisturizing lotion) 1 x Per Week/30 Days Discharge Instructions: Apply moisturizing lotion as directed Prim Dressing: KerraCel Ag Gelling Fiber Dressing, 4x5 in (silver alginate) 1 x Per Week/30 Days ary Discharge Instructions: Apply silver alginate to wound bed as instructed Secondary Dressing: Woven Gauze Sponge, Non-Sterile 4x4 in 1 x Per Week/30 Days Discharge Instructions: Apply over primary dressing as directed. Secondary Dressing: ABD Pad, 8x10 1 x Per Week/30 Days Discharge Instructions: Apply over primary dressing as directed. Secondary Dressing: Zetuvit Plus 4x8 in 1 x Per Week/30 Days Discharge Instructions: Apply over primary dressing as directed. Compression Wrap: FourPress (4 layer compression wrap) 1 x Per Week/30 Days Discharge Instructions: Apply four layer compression as directed. May also use Miliken CoFlex 2 layer compression system as alternative. Compression Wrap: Unnaboot w/Calamine, 4x10 (in/yd) 1 x Per Week/30 Days Discharge Instructions: Apply Unnaboot at top to secure Wound #11 - Lower Leg Wound Laterality: Right, Posterior Cleanser: Soap and Water 1 x Per Week/30 Days Discharge Instructions: May shower and wash wound with dial antibacterial soap and water prior to dressing change. Cleanser: Wound Cleanser 1 x Per Week/30 Days Discharge Instructions: Cleanse the wound with wound cleanser prior to applying a clean dressing using gauze sponges, not tissue or cotton balls. Peri-Wound Care: Triamcinolone 15 (g) 1 x Per Week/30  Days Discharge Instructions: Use triamcinolone 15 (g) as directed Peri-Wound Care: Sween Lotion (Moisturizing lotion) 1 x Per Week/30 Days Discharge Instructions: Apply moisturizing lotion as directed Prim Dressing: KerraCel Ag Gelling Fiber Dressing, 4x5 in (silver alginate)  1 x Per Week/30 Days ary Discharge Instructions: Apply silver alginate to wound bed as instructed Secondary Dressing: Woven Gauze Sponge, Non-Sterile 4x4 in 1 x Per Week/30 Days Discharge Instructions: Apply over primary dressing as directed. Secondary Dressing: ABD Pad, 8x10 1 x Per Week/30 Days Discharge Instructions: Apply over primary dressing as directed. Secondary Dressing: Zetuvit Plus 4x8 in 1 x Per Week/30 Days Discharge Instructions: Apply over primary dressing as directed. Compression Wrap: FourPress (4 layer compression wrap) 1 x Per Week/30 Days Discharge Instructions: Apply four layer compression as directed. May also use Miliken CoFlex 2 layer compression system as alternative. Compression Wrap: Unnaboot w/Calamine, 4x10 (in/yd) 1 x Per Week/30 Days Discharge Instructions: Apply Unnaboot at top to secure Wound #12 - Lower Leg Wound Laterality: Right, Anterior, Proximal Cleanser: Soap and Water 1 x Per Week/30 Days Discharge Instructions: May shower and wash wound with dial antibacterial soap and water prior to dressing change. Cleanser: Wound Cleanser 1 x Per Week/30 Days Discharge Instructions: Cleanse the wound with wound cleanser prior to applying a clean dressing using gauze sponges, not tissue or cotton balls. Peri-Wound Care: Triamcinolone 15 (g) 1 x Per Week/30 Days Discharge Instructions: Use triamcinolone 15 (g) as directed Peri-Wound Care: Sween Lotion (Moisturizing lotion) 1 x Per Week/30 Days Discharge Instructions: Apply moisturizing lotion as directed Prim Dressing: KerraCel Ag Gelling Fiber Dressing, 4x5 in (silver alginate) 1 x Per Week/30 Days ary Discharge Instructions: Apply silver  alginate to wound bed as instructed Secondary Dressing: Woven Gauze Sponge, Non-Sterile 4x4 in 1 x Per Week/30 Days Discharge Instructions: Apply over primary dressing as directed. Secondary Dressing: ABD Pad, 8x10 1 x Per Week/30 Days Discharge Instructions: Apply over primary dressing as directed. Secondary Dressing: Zetuvit Plus 4x8 in 1 x Per Week/30 Days Discharge Instructions: Apply over primary dressing as directed. Compression Wrap: FourPress (4 layer compression wrap) 1 x Per Week/30 Days Discharge Instructions: Apply four layer compression as directed. May also use Miliken CoFlex 2 layer compression system as alternative. Compression Wrap: Unnaboot w/Calamine, 4x10 (in/yd) 1 x Per Week/30 Days Discharge Instructions: Apply Unnaboot at top to secure Wound #13 - Lower Leg Wound Laterality: Right, Anterior Cleanser: Soap and Water 1 x Per Week/30 Days Discharge Instructions: May shower and wash wound with dial antibacterial soap and water prior to dressing change. Cleanser: Wound Cleanser 1 x Per Week/30 Days Discharge Instructions: Cleanse the wound with wound cleanser prior to applying a clean dressing using gauze sponges, not tissue or cotton balls. Peri-Wound Care: Triamcinolone 15 (g) 1 x Per Week/30 Days Discharge Instructions: Use triamcinolone 15 (g) as directed Peri-Wound Care: Sween Lotion (Moisturizing lotion) 1 x Per Week/30 Days Discharge Instructions: Apply moisturizing lotion as directed Prim Dressing: KerraCel Ag Gelling Fiber Dressing, 4x5 in (silver alginate) 1 x Per Week/30 Days ary Discharge Instructions: Apply silver alginate to wound bed as instructed Secondary Dressing: Woven Gauze Sponge, Non-Sterile 4x4 in 1 x Per Week/30 Days Discharge Instructions: Apply over primary dressing as directed. Secondary Dressing: ABD Pad, 8x10 1 x Per Week/30 Days Discharge Instructions: Apply over primary dressing as directed. Secondary Dressing: Zetuvit Plus 4x8 in 1 x Per  Week/30 Days Discharge Instructions: Apply over primary dressing as directed. Compression Wrap: FourPress (4 layer compression wrap) 1 x Per Week/30 Days Discharge Instructions: Apply four layer compression as directed. May also use Miliken CoFlex 2 layer compression system as alternative. Compression Wrap: Unnaboot w/Calamine, 4x10 (in/yd) 1 x Per Week/30 Days Discharge Instructions: Apply Unnaboot at top to secure Patient  Medications llergies: niacin, sulfamethoxazole, aspirin, Bactrim, benzoin, cephalexin, clindamycin, doxycycline, iohexol, lisinopril, naproxen, Sulfa (Sulfonamide A Antibiotics) Notifications Medication Indication Start End cellulitis right foot 11/06/2020 doxycycline monohydrate DOSE oral 100 mg capsule - 1 capsule oral bid for 7 days Electronic Signature(s) Signed: 11/06/2020 1:40:39 PM By: [name] MD Entered By: [name] on 11/06/2020 13:40:38 -------------------------------------------------------------------------------- Problem List Details Patient Name: Date of Service: [name] 11/06/2020 12:45 PM Medical Record Number: [id number] Patient Account Number: [id number] Date of Birth/Sex: Treating RN: 03/29/1971 (50 y.o. [name] Primary Care Provider: [name] Other Clinician: Referring Provider: Treating Provider/Extender: [name] in Treatment: 20 Active Problems ICD-10 Encounter Code Description Active Date MDM Diagnosis I87.333 Chronic venous hypertension (idiopathic) with ulcer and inflammation of 06/16/2020 No Yes bilateral lower extremity L97.821 Non-pressure chronic ulcer of other part of left lower leg limited to breakdown 06/16/2020 No Yes of skin L97.811 Non-pressure chronic ulcer of other part of right lower leg limited to breakdown 06/16/2020 No Yes of skin L03.115 Cellulitis of right lower limb 11/06/2020 No Yes I89.0 Lymphedema, not elsewhere classified 06/16/2020 No  Yes M30.0 Polyarteritis nodosa 06/16/2020 No Yes Inactive Problems Resolved Problems ICD-10 Code Description Active Date Resolved Date L03.116 Cellulitis of left lower limb 07/07/2020 07/07/2020 L03.119 Cellulitis of unspecified part of limb 10/09/2020 10/09/2020 Electronic Signature(s) Signed: 11/06/2020 5:20:40 PM By: [name] MD Entered By: [name] on 11/06/2020 13:31:21 -------------------------------------------------------------------------------- Progress Note Details Patient Name: Date of Service: [name] 11/06/2020 12:45 PM Medical Record Number: [id number] Patient Account Number: [id number] Date of Birth/Sex: Treating RN: 05-22-1970 (50 y.o. [name] Primary Care Provider: [name] Other Clinician: Referring Provider: Treating Provider/Extender: [name] in Treatment: 20 Subjective History of Present Illness (HPI) Ms. Spranger is a 50 year old female with a past medical history of psoriasis, hyperlipidemia, chronic lower extremity edema as described, cutaneous polyarteritis nodosa, sleep apnea, fibromyalgia, and osteoarthritis of both knees. She was recently followed in our clinic in February for her lower extremity wounds due to chronic venous insufficiency and lymphedema. She was treated with compression wraps and Silver alginate And occasionally with Hydrofera Blue. These closed after a couple months of treatment. During that time she received juxta lite compressions. She was ordered lymphedema pumps to but these had not arrived prior to her wound closure. Today she presents with reopening of her lower extremity wounds. She has still not received her lymphedema pumps. She states she has been using juxta lite compressions daily. She denies acute signs of infection. 5/27; patient presents for follow-up. She was last seen 2 weeks ago. She could not make it to her last week appointment due to feeling sick. She  left the wraps on for over a week and now is feeling increased pain to the lower extremities. She took them off 5 days ago and has noticed increased swelling. She has tried using her juxta light compression. 6/8; patient presents for 1 week follow-up. She finished Keflex and states that the pain in her legs has resolved and she feels much better. She has tolerated 3 layer compression with silver alginate. She denies signs of infection. 6/15; the patient I do not usually follow-up. She has severe stage III lymphedema. She took her wraps off 3 days ago because she was soaking through. She called for an earlier appointment however we could not fit her in. She has several small areas mostly on the right lateral with weeping edema fluid. She also notes that  she was bitten by an insect on the posterior leg. She now has a lot of erythema mostly in the dorsal foot and ankle. Warm and tender. I note that she only recently seems to have finished Keflex Objective Constitutional Sitting or standing Blood Pressure is within target range for patient.. Pulse regular and within target range for patient.Marland Kitchen Respirations regular, non-labored and within target range.. Temperature is normal and within the target range for the patient.Marland Kitchen Appears in no distress. Vitals Time Taken: 1:05 PM, Height: 62 in, Weight: 304 lbs, BMI: 55.6, Temperature: 98.1 F, Pulse: 73 bpm, Respiratory Rate: 17 breaths/min, Blood Pressure: 118/74 mmHg. Cardiovascular Pedal pulses are palpable bilaterally. Edema present in both extremities. This is nonpitting. Stage III lymphedema.. General Notes: Wound exam; severe bilateral lymphedema. She has weeping areas especially on the right lateral tibial area. Also areas on the left anterior right anterior. Integumentary (Hair, Skin) Erythema on the dorsal right foot extending into the ankle which is warm and somewhat tender.. Wound #10 status is Open. Original cause of wound was Gradually Appeared.  The date acquired was: 09/08/2020. The wound has been in treatment 6 weeks. The wound is located on the Left,Lateral Lower Leg. The wound measures 0.4cm length x 0.2cm width x 0.1cm depth; 0.063cm^2 area and 0.006cm^3 volume. There is Fat Layer (Subcutaneous Tissue) exposed. There is no tunneling or undermining noted. There is a medium amount of serosanguineous drainage noted. The wound margin is distinct with the outline attached to the wound base. There is large (67-100%) red granulation within the wound bed. There is no necrotic tissue within the wound bed. Wound #11 status is Open. Original cause of wound was Gradually Appeared. The date acquired was: 09/08/2020. The wound has been in treatment 6 weeks. The wound is located on the Right,Posterior Lower Leg. The wound measures 0.1cm length x 0.1cm width x 0.1cm depth; 0.008cm^2 area and 0.001cm^3 volume. There is no tunneling or undermining noted. There is a none present amount of drainage noted. The wound margin is distinct with the outline attached to the wound base. There is no granulation within the wound bed. There is no necrotic tissue within the wound bed. Wound #12 status is Open. Original cause of wound was Gradually Appeared. The date acquired was: 09/08/2020. The wound has been in treatment 6 weeks. The wound is located on the Right,Proximal,Anterior Lower Leg. The wound measures 0.5cm length x 0.6cm width x 0.1cm depth; 0.236cm^2 area and 0.024cm^3 volume. There is Fat Layer (Subcutaneous Tissue) exposed. There is no tunneling or undermining noted. There is a large amount of serous drainage noted. The wound margin is distinct with the outline attached to the wound base. There is large (67-100%) red granulation within the wound bed. There is no necrotic tissue within the wound bed. Wound #13 status is Open. Original cause of wound was Gradually Appeared. The date acquired was: 09/08/2020. The wound has been in treatment 6 weeks. The wound is  located on the Right,Anterior Lower Leg. The wound measures 1cm length x 0.4cm width x 0.1cm depth; 0.314cm^2 area and 0.031cm^3 volume. There is Fat Layer (Subcutaneous Tissue) exposed. There is no tunneling or undermining noted. There is a medium amount of serosanguineous drainage noted. The wound margin is distinct with the outline attached to the wound base. There is large (67-100%) red granulation within the wound bed. There is no necrotic tissue within the wound bed. Assessment Active Problems ICD-10 Chronic venous hypertension (idiopathic) with ulcer and inflammation of bilateral lower extremity Non-pressure  chronic ulcer of other part of left lower leg limited to breakdown of skin Non-pressure chronic ulcer of other part of right lower leg limited to breakdown of skin Cellulitis of right lower limb Lymphedema, not elsewhere classified Polyarteritis nodosa Procedures Wound #10 Pre-procedure diagnosis of Wound #10 is a Lymphedema located on the Left,Lateral Lower Leg . There was a Four Layer Compression Therapy Procedure by Deon Pilling, RN. Post procedure Diagnosis Wound #10: Same as Pre-Procedure Wound #11 Pre-procedure diagnosis of Wound #11 is a Lymphedema located on the Right,Posterior Lower Leg . There was a Four Layer Compression Therapy Procedure by Deon Pilling, RN. Post procedure Diagnosis Wound #11: Same as Pre-Procedure Plan Follow-up Appointments: Return Appointment in 1 week. - Dr. Heber [place] Nurse Visit: [place] or [place] for RN visit Bathing/ Shower/ Hygiene: May shower with protection but do not get wound dressing(s) wet. - May use cast protectors...you can find those at [facility] or [facility] Edema Control - Lymphedema / SCD / Other: Lymphedema Pumps. Use Lymphedema pumps on leg(s) 2-3 times a day for 45-60 minutes. If wearing any wraps or hose, do not remove them. Continue exercising as instructed. - Keep phone line open for [employer] to schedule an appointment to set up  pumps at home. Elevate legs to the level of the heart or above for 30 minutes daily and/or when sitting, a frequency of: Avoid standing for long periods of time. Exercise regularly Additional Orders / Instructions: Other: - start taking antibiotic as prescribed WOUND #10: - Lower Leg Wound Laterality: Left, Lateral Cleanser: Soap and Water 1 x Per Week/30 Days Discharge Instructions: May shower and wash wound with dial antibacterial soap and water prior to dressing change. Cleanser: Wound Cleanser 1 x Per Week/30 Days Discharge Instructions: Cleanse the wound with wound cleanser prior to applying a clean dressing using gauze sponges, not tissue or cotton balls. Peri-Wound Care: Triamcinolone 15 (g) 1 x Per Week/30 Days Discharge Instructions: Use triamcinolone 15 (g) as directed Peri-Wound Care: Sween Lotion (Moisturizing lotion) 1 x Per Week/30 Days Discharge Instructions: Apply moisturizing lotion as directed Prim Dressing: KerraCel Ag Gelling Fiber Dressing, 4x5 in (silver alginate) 1 x Per Week/30 Days ary Discharge Instructions: Apply silver alginate to wound bed as instructed Secondary Dressing: Woven Gauze Sponge, Non-Sterile 4x4 in 1 x Per Week/30 Days Discharge Instructions: Apply over primary dressing as directed. Secondary Dressing: ABD Pad, 8x10 1 x Per Week/30 Days Discharge Instructions: Apply over primary dressing as directed. Secondary Dressing: Zetuvit Plus 4x8 in 1 x Per Week/30 Days Discharge Instructions: Apply over primary dressing as directed. Com pression Wrap: FourPress (4 layer compression wrap) 1 x Per Week/30 Days Discharge Instructions: Apply four layer compression as directed. May also use Miliken CoFlex 2 layer compression system as alternative. Com pression Wrap: Unnaboot w/Calamine, 4x10 (in/yd) 1 x Per Week/30 Days Discharge Instructions: Apply Unnaboot at top to secure WOUND #11: - Lower Leg Wound Laterality: Right, Posterior Cleanser: Soap and Water 1 x  Per Week/30 Days Discharge Instructions: May shower and wash wound with dial antibacterial soap and water prior to dressing change. Cleanser: Wound Cleanser 1 x Per Week/30 Days Discharge Instructions: Cleanse the wound with wound cleanser prior to applying a clean dressing using gauze sponges, not tissue or cotton balls. Peri-Wound Care: Triamcinolone 15 (g) 1 x Per Week/30 Days Discharge Instructions: Use triamcinolone 15 (g) as directed Peri-Wound Care: Sween Lotion (Moisturizing lotion) 1 x Per Week/30 Days Discharge Instructions: Apply moisturizing lotion as directed Prim Dressing: KerraCel Ag Heidi Dach  Fiber Dressing, 4x5 in (silver alginate) 1 x Per Week/30 Days ary Discharge Instructions: Apply silver alginate to wound bed as instructed Secondary Dressing: Woven Gauze Sponge, Non-Sterile 4x4 in 1 x Per Week/30 Days Discharge Instructions: Apply over primary dressing as directed. Secondary Dressing: ABD Pad, 8x10 1 x Per Week/30 Days Discharge Instructions: Apply over primary dressing as directed. Secondary Dressing: Zetuvit Plus 4x8 in 1 x Per Week/30 Days Discharge Instructions: Apply over primary dressing as directed. Com pression Wrap: FourPress (4 layer compression wrap) 1 x Per Week/30 Days Discharge Instructions: Apply four layer compression as directed. May also use Miliken CoFlex 2 layer compression system as alternative. Com pression Wrap: Unnaboot w/Calamine, 4x10 (in/yd) 1 x Per Week/30 Days Discharge Instructions: Apply Unnaboot at top to secure WOUND #12: - Lower Leg Wound Laterality: Right, Anterior, Proximal Cleanser: Soap and Water 1 x Per Week/30 Days Discharge Instructions: May shower and wash wound with dial antibacterial soap and water prior to dressing change. Cleanser: Wound Cleanser 1 x Per Week/30 Days Discharge Instructions: Cleanse the wound with wound cleanser prior to applying a clean dressing using gauze sponges, not tissue or cotton balls. Peri-Wound  Care: Triamcinolone 15 (g) 1 x Per Week/30 Days Discharge Instructions: Use triamcinolone 15 (g) as directed Peri-Wound Care: Sween Lotion (Moisturizing lotion) 1 x Per Week/30 Days Discharge Instructions: Apply moisturizing lotion as directed Prim Dressing: KerraCel Ag Gelling Fiber Dressing, 4x5 in (silver alginate) 1 x Per Week/30 Days ary Discharge Instructions: Apply silver alginate to wound bed as instructed Secondary Dressing: Woven Gauze Sponge, Non-Sterile 4x4 in 1 x Per Week/30 Days Discharge Instructions: Apply over primary dressing as directed. Secondary Dressing: ABD Pad, 8x10 1 x Per Week/30 Days Discharge Instructions: Apply over primary dressing as directed. Secondary Dressing: Zetuvit Plus 4x8 in 1 x Per Week/30 Days Discharge Instructions: Apply over primary dressing as directed. Com pression Wrap: FourPress (4 layer compression wrap) 1 x Per Week/30 Days Discharge Instructions: Apply four layer compression as directed. May also use Miliken CoFlex 2 layer compression system as alternative. Com pression Wrap: Unnaboot w/Calamine, 4x10 (in/yd) 1 x Per Week/30 Days Discharge Instructions: Apply Unnaboot at top to secure WOUND #13: - Lower Leg Wound Laterality: Right, Anterior Cleanser: Soap and Water 1 x Per Week/30 Days Discharge Instructions: May shower and wash wound with dial antibacterial soap and water prior to dressing change. Cleanser: Wound Cleanser 1 x Per Week/30 Days Discharge Instructions: Cleanse the wound with wound cleanser prior to applying a clean dressing using gauze sponges, not tissue or cotton balls. Peri-Wound Care: Triamcinolone 15 (g) 1 x Per Week/30 Days Discharge Instructions: Use triamcinolone 15 (g) as directed Peri-Wound Care: Sween Lotion (Moisturizing lotion) 1 x Per Week/30 Days Discharge Instructions: Apply moisturizing lotion as directed Prim Dressing: KerraCel Ag Gelling Fiber Dressing, 4x5 in (silver alginate) 1 x Per Week/30  Days ary Discharge Instructions: Apply silver alginate to wound bed as instructed Secondary Dressing: Woven Gauze Sponge, Non-Sterile 4x4 in 1 x Per Week/30 Days Discharge Instructions: Apply over primary dressing as directed. Secondary Dressing: ABD Pad, 8x10 1 x Per Week/30 Days Discharge Instructions: Apply over primary dressing as directed. Secondary Dressing: Zetuvit Plus 4x8 in 1 x Per Week/30 Days Discharge Instructions: Apply over primary dressing as directed. Compression Wrap: FourPress (4 layer compression wrap) 1 x Per Week/30 Days Discharge Instructions: Apply four layer compression as directed. May also use Miliken CoFlex 2 layer compression system as alternative. Compression Wrap: Unnaboot w/Calamine, 4x10 (in/yd) 1 x Per Week/30  Days Discharge Instructions: Apply Unnaboot at top to secure 1. Patient might have cellulitis of the right foot and ankle although this could easily be poorly controlled edema. With stasis dermatitis. 2. I am going to give her doxycycline 100 twice daily for 7 days although she is listed as allergic to this I do not know exactly what reaction she had she said it was a long time ago. 3. She recently completed Keflex although she is listed as allergic to cephalosporins as well 4. I am putting her back in 4 layer compression 5. We are working on getting her compression pumps apparently somebody came to her home but they are not going to be able to work with her until she changes her insurance or they become in network with her Musician) Signed: 11/06/2020 5:20:40 PM By: [name] MD Entered By: [name] on 11/06/2020 13:38:50 -------------------------------------------------------------------------------- SuperBill Details Patient Name: Date of Service: [name] 11/06/2020 Medical Record Number: [id number] Patient Account Number: [id number] Date of Birth/Sex: Treating RN: April 03, 1971 (50 y.o. 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 Modifier: Quantity: 1 Electronic Signature(s) Signed: 11/06/2020 5:20:40 PM By: [name] MD Signed: 11/06/2020 6:17:32 PM By: Baruch Gouty RN, BSN Entered By: Baruch Gouty on 11/06/2020 13:31:46

## 2020-11-09 ENCOUNTER — Other Ambulatory Visit: Payer: Self-pay

## 2020-11-09 ENCOUNTER — Encounter (HOSPITAL_BASED_OUTPATIENT_CLINIC_OR_DEPARTMENT_OTHER): Payer: Medicare Other | Admitting: Internal Medicine

## 2020-11-09 DIAGNOSIS — L97821 Non-pressure chronic ulcer of other part of left lower leg limited to breakdown of skin: Secondary | ICD-10-CM | POA: Diagnosis not present

## 2020-11-09 DIAGNOSIS — I87333 Chronic venous hypertension (idiopathic) with ulcer and inflammation of bilateral lower extremity: Secondary | ICD-10-CM | POA: Diagnosis not present

## 2020-11-09 DIAGNOSIS — I872 Venous insufficiency (chronic) (peripheral): Secondary | ICD-10-CM | POA: Diagnosis not present

## 2020-11-09 DIAGNOSIS — I890 Lymphedema, not elsewhere classified: Secondary | ICD-10-CM | POA: Diagnosis not present

## 2020-11-09 DIAGNOSIS — L97811 Non-pressure chronic ulcer of other part of right lower leg limited to breakdown of skin: Secondary | ICD-10-CM | POA: Diagnosis not present

## 2020-11-09 DIAGNOSIS — L03115 Cellulitis of right lower limb: Secondary | ICD-10-CM | POA: Diagnosis not present

## 2020-11-10 NOTE — Progress Notes (Signed)
[name], [name] ([id number]) Visit Report for 11/09/2020 Arrival Information Details Patient Name: Date of Service: [name], [name] 11/09/2020 11:00 A M Medical Record Number: [id number] Patient Account Number: [id number] Date of Birth/Sex: Treating RN: [birth year] (50 y.o. [name], Meta.Reding Primary Care Antoino Westhoff: [name] Other Clinician: Referring Sonali Wivell: Treating Draper Gallon/Extender: Judeth Porch in Treatment: 20 Visit Information History Since Last Visit Added or deleted any medications: No Patient Arrived: Ambulatory Any new allergies or adverse reactions: No Arrival Time: 11:22 Had a fall or experienced change in No Accompanied By: self activities of daily living that may affect Transfer Assistance: None risk of falls: Patient Identification Verified: Yes Signs or symptoms of abuse/neglect since last visito No Secondary Verification Process Completed: Yes Hospitalized since last visit: No Patient Requires Transmission-Based Precautions: No Implantable device outside of the clinic excluding No Patient Has Alerts: No cellular tissue based products placed in the center since last visit: Has Dressing in Place as Prescribed: Yes Has Compression in Place as Prescribed: Yes Pain Present Now: No Notes per patient currently taking the oral abx prescribed on Friday by MD. Per patient no complaints or concerns. Electronic Signature(s) Signed: 11/10/2020 4:38:16 PM By: Deon Pilling Entered By: Deon Pilling on 11/09/2020 11:47:28 -------------------------------------------------------------------------------- Compression Therapy Details Patient Name: Date of Service: [name], [name] 11/09/2020 11:00 A M Medical Record Number: [id number] Patient Account Number: [id number] Date of Birth/Sex: Treating RN: 20-Jul-1970 (50 y.o. [name] Primary Care Darell Saputo: [name] Other Clinician: Referring Anacarolina Evelyn: Treating Martisha Toulouse/Extender:  Judeth Porch in Treatment: 20 Compression Therapy Performed for Wound Assessment: Wound #10 Left,Lateral Lower Leg Performed By: Clinician Deon Pilling, RN Compression Type: Four Layer Electronic Signature(s) Signed: 11/10/2020 4:38:16 PM By: Deon Pilling Entered By: Deon Pilling on 11/09/2020 11:49:17 -------------------------------------------------------------------------------- Compression Therapy Details Patient Name: Date of Service: [name], [name] 11/09/2020 11:00 A M Medical Record Number: [id number] Patient Account Number: [id number] Date of Birth/Sex: Treating RN: Feb 02, 1971 (50 y.o. [name] Primary Care Jyrah Blye: [name] Other Clinician: Referring Stevi Hollinshead: Treating Randon Somera/Extender: Judeth Porch in Treatment: 20 Compression Therapy Performed for Wound Assessment: Wound #13 Right,Anterior Lower Leg Performed By: Clinician Deon Pilling, RN Compression Type: Four Layer Electronic Signature(s) Signed: 11/10/2020 4:38:16 PM By: Deon Pilling Entered By: Deon Pilling on 11/09/2020 11:49:17 -------------------------------------------------------------------------------- Compression Therapy Details Patient Name: Date of Service: [name], [name] 11/09/2020 11:00 A M Medical Record Number: [id number] Patient Account Number: [id number] Date of Birth/Sex: Treating RN: 1970-12-16 (50 y.o. [name] Primary Care Nick Stults: [name] Other Clinician: Referring Hazleigh Mccleave: Treating Laquita Harlan/Extender: Judeth Porch in Treatment: 20 Compression Therapy Performed for Wound Assessment: Wound #11 Right,Posterior Lower Leg Performed By: Clinician Deon Pilling, RN Compression Type: Four Layer Electronic Signature(s) Signed: 11/10/2020 4:38:16 PM By: Deon Pilling Entered By: Deon Pilling on 11/09/2020  11:49:17 -------------------------------------------------------------------------------- Compression Therapy Details Patient Name: Date of Service: [name], [name] 11/09/2020 11:00 A M Medical Record Number: [id number] Patient Account Number: [id number] Date of Birth/Sex: Treating RN: 12-Jan-1971 (50 y.o. [name] Primary Care Mosi Hannold: [name] Other Clinician: Referring Abimelec Grochowski: Treating Shannia Jacuinde/Extender: Judeth Porch in Treatment: 20 Compression Therapy Performed for Wound Assessment: Wound #12 Right,Proximal,Anterior Lower Leg Performed By: Clinician Deon Pilling, RN Compression Type: Four Layer Electronic Signature(s) Signed: 11/10/2020 4:38:16 PM By: Deon Pilling Entered By: Deon Pilling on 11/09/2020 11:49:17 -------------------------------------------------------------------------------- Encounter Discharge Information Details Patient Name: Date of Service: [name], Erica K. 11/09/2020  11:00 A M Medical Record Number: [id number] Patient Account Number: [id number] Date of Birth/Sex: Treating RN: 1970-12-29 (50 y.o. [name], Tammi Klippel Primary Care Jahna Liebert: [name] Other Clinician: Referring Lathon Adan: Treating Maika Mcelveen/Extender: Judeth Porch in Treatment: 20 Encounter Discharge Information Items Discharge Condition: Stable Ambulatory Status: Ambulatory Discharge Destination: Home Transportation: Private Auto Accompanied By: self Schedule Follow-up Appointment: Yes Clinical Summary of Care: Electronic Signature(s) Signed: 11/10/2020 4:38:16 PM By: Deon Pilling Entered By: Deon Pilling on 11/09/2020 11:50:54 -------------------------------------------------------------------------------- Patient/Caregiver Education Details Patient Name: Date of Service: [name] 6/27/2022andnbsp11:00 [place] Record Number: [id number] Patient Account Number: [id number] Date of  Birth/Gender: Treating RN: [birth year] (50 y.o. [name] Primary Care Physician: [name] Other Clinician: Referring Physician: Treating Physician/Extender: Judeth Porch in Treatment: 20 Education Assessment Education Provided To: Patient Education Topics Provided Safety: Handouts: Personal Safety Methods: Explain/Verbal Responses: Reinforcements needed Electronic Signature(s) Signed: 11/10/2020 4:38:16 PM By: Deon Pilling Entered By: Deon Pilling on 11/09/2020 11:50:36 -------------------------------------------------------------------------------- Wound Assessment Details Patient Name: Date of Service: [name], [name] 11/09/2020 11:00 A M Medical Record Number: [id number] Patient Account Number: [id number] Date of Birth/Sex: Treating RN: 1970/05/28 (50 y.o. [name], Meta.Reding Primary Care Krishay Faro: [name] Other Clinician: Referring Redmond Whittley: Treating Gerard Bonus/Extender: Judeth Porch in Treatment: 20 Wound Status Wound Number: 10 Primary Etiology: Lymphedema Wound Location: Left, Lateral Lower Leg Wound Status: Open Wounding Event: Gradually Appeared Date Acquired: 09/08/2020 Weeks Of Treatment: 6 Clustered Wound: No Wound Measurements Length: (cm) 0.4 Width: (cm) 0.2 Depth: (cm) 0.1 Area: (cm) 0.063 Volume: (cm) 0.006 % Reduction in Area: 100% % Reduction in Volume: 100% Wound Description Classification: Full Thickness Without Exposed Support Structur es Treatment Notes Wound #10 (Lower Leg) Wound Laterality: Left, Lateral Cleanser Soap and Water Discharge Instruction: May shower and wash wound with dial antibacterial soap and water prior to dressing change. Wound Cleanser Discharge Instruction: Cleanse the wound with wound cleanser prior to applying a clean dressing using gauze sponges, not tissue or cotton balls. Peri-Wound Care Triamcinolone 15 (g) Discharge Instruction:  Use triamcinolone 15 (g) as directed Sween Lotion (Moisturizing lotion) Discharge Instruction: Apply moisturizing lotion as directed Topical Primary Dressing KerraCel Ag Gelling Fiber Dressing, 4x5 in (silver alginate) Discharge Instruction: Apply silver alginate to wound bed as instructed Secondary Dressing Woven Gauze Sponge, Non-Sterile 4x4 in Discharge Instruction: Apply over primary dressing as directed. ABD Pad, 8x10 Discharge Instruction: Apply over primary dressing as directed. Zetuvit Plus 4x8 in Discharge Instruction: Apply over primary dressing as directed. Secured With Compression Wrap FourPress (4 layer compression wrap) Discharge Instruction: Apply four layer compression as directed. May also use Miliken CoFlex 2 layer compression system as alternative. Unnaboot w/Calamine, 4x10 (in/yd) Discharge Instruction: Apply Unnaboot at top to secure Compression Stockings Add-Ons Electronic Signature(s) Signed: 11/10/2020 4:38:16 PM By: Deon Pilling Entered By: Deon Pilling on 11/09/2020 11:49:01 -------------------------------------------------------------------------------- Wound Assessment Details Patient Name: Date of Service: [name], [name] 11/09/2020 11:00 A M Medical Record Number: [id number] Patient Account Number: [id number] Date of Birth/Sex: Treating RN: 03/09/1971 (50 y.o. [name], Meta.Reding Primary Care Keirston Saephanh: [name] Other Clinician: Referring Ellias Mcelreath: Treating Jaselynn Tamas/Extender: Judeth Porch in Treatment: 20 Wound Status Wound Number: 11 Primary Etiology: Lymphedema Wound Location: Right, Posterior Lower Leg Wound Status: Open Wounding Event: Gradually Appeared Date Acquired: 09/08/2020 Weeks Of Treatment: 6 Clustered Wound: No Wound Measurements Length: (cm) 0.1 Width: (cm) 0.1 Depth: (cm) 0.1 Area: (cm) 0.008 Volume: (cm) 0.001 %  Reduction in Area: 99.9% % Reduction in Volume: 99.8% Wound  Description Classification: Full Thickness Without Exposed Support Structur es Treatment Notes Wound #11 (Lower Leg) Wound Laterality: Right, Posterior Cleanser Soap and Water Discharge Instruction: May shower and wash wound with dial antibacterial soap and water prior to dressing change. Wound Cleanser Discharge Instruction: Cleanse the wound with wound cleanser prior to applying a clean dressing using gauze sponges, not tissue or cotton balls. Peri-Wound Care Triamcinolone 15 (g) Discharge Instruction: Use triamcinolone 15 (g) as directed Sween Lotion (Moisturizing lotion) Discharge Instruction: Apply moisturizing lotion as directed Topical Primary Dressing KerraCel Ag Gelling Fiber Dressing, 4x5 in (silver alginate) Discharge Instruction: Apply silver alginate to wound bed as instructed Secondary Dressing Woven Gauze Sponge, Non-Sterile 4x4 in Discharge Instruction: Apply over primary dressing as directed. ABD Pad, 8x10 Discharge Instruction: Apply over primary dressing as directed. Zetuvit Plus 4x8 in Discharge Instruction: Apply over primary dressing as directed. Secured With Compression Wrap FourPress (4 layer compression wrap) Discharge Instruction: Apply four layer compression as directed. May also use Miliken CoFlex 2 layer compression system as alternative. Unnaboot w/Calamine, 4x10 (in/yd) Discharge Instruction: Apply Unnaboot at top to secure Compression Stockings Add-Ons Electronic Signature(s) Signed: 11/10/2020 4:38:16 PM By: Deon Pilling Entered By: Deon Pilling on 11/09/2020 11:49:01 -------------------------------------------------------------------------------- Wound Assessment Details Patient Name: Date of Service: [name], [name] 11/09/2020 11:00 A M Medical Record Number: [id number] Patient Account Number: [id number] Date of Birth/Sex: Treating RN: [birth year] (50 y.o. [name], Meta.Reding Primary Care Chantry Headen: [name] Other  Clinician: Referring Aksh Swart: Treating Poppy Mcafee/Extender: Judeth Porch in Treatment: 20 Wound Status Wound Number: 12 Primary Etiology: Lymphedema Wound Location: Right, Proximal, Anterior Lower Leg Wound Status: Open Wounding Event: Gradually Appeared Date Acquired: 09/08/2020 Weeks Of Treatment: 6 Clustered Wound: No Wound Measurements Length: (cm) 0.5 Width: (cm) 0.6 Depth: (cm) 0.1 Area: (cm) 0.236 Volume: (cm) 0.024 % Reduction in Area: 69.9% % Reduction in Volume: 69.6% Wound Description Classification: Full Thickness Without Exposed Support Structur es Treatment Notes Wound #12 (Lower Leg) Wound Laterality: Right, Anterior, Proximal Cleanser Soap and Water Discharge Instruction: May shower and wash wound with dial antibacterial soap and water prior to dressing change. Wound Cleanser Discharge Instruction: Cleanse the wound with wound cleanser prior to applying a clean dressing using gauze sponges, not tissue or cotton balls. Peri-Wound Care Triamcinolone 15 (g) Discharge Instruction: Use triamcinolone 15 (g) as directed Sween Lotion (Moisturizing lotion) Discharge Instruction: Apply moisturizing lotion as directed Topical Primary Dressing KerraCel Ag Gelling Fiber Dressing, 4x5 in (silver alginate) Discharge Instruction: Apply silver alginate to wound bed as instructed Secondary Dressing Woven Gauze Sponge, Non-Sterile 4x4 in Discharge Instruction: Apply over primary dressing as directed. ABD Pad, 8x10 Discharge Instruction: Apply over primary dressing as directed. Zetuvit Plus 4x8 in Discharge Instruction: Apply over primary dressing as directed. Secured With Compression Wrap FourPress (4 layer compression wrap) Discharge Instruction: Apply four layer compression as directed. May also use Miliken CoFlex 2 layer compression system as alternative. Unnaboot w/Calamine, 4x10 (in/yd) Discharge Instruction: Apply Unnaboot at top to  secure Compression Stockings Add-Ons Electronic Signature(s) Signed: 11/10/2020 4:38:16 PM By: Deon Pilling Entered By: Deon Pilling on 11/09/2020 11:49:01 -------------------------------------------------------------------------------- Wound Assessment Details Patient Name: Date of Service: [name], [name] 11/09/2020 11:00 A M Medical Record Number: [id number] Patient Account Number: [id number] Date of Birth/Sex: Treating RN: 11/04/1970 (50 y.o. [name], Tammi Klippel Primary Care Dresden Lozito: [name] Other Clinician: Referring Cleota Pellerito: Treating Glynn Freas/Extender: Judeth Porch in Treatment: 59  Wound Status Wound Number: 13 Primary Etiology: Lymphedema Wound Location: Right, Anterior Lower Leg Wound Status: Open Wounding Event: Gradually Appeared Date Acquired: 09/08/2020 Weeks Of Treatment: 6 Clustered Wound: No Wound Measurements Length: (cm) 1 Width: (cm) 0.4 Depth: (cm) 0.1 Area: (cm) 0.314 Volume: (cm) 0.031 % Reduction in Area: 77.3% % Reduction in Volume: 77.5% Wound Description Classification: Full Thickness Without Exposed Support Structur es Treatment Notes Wound #13 (Lower Leg) Wound Laterality: Right, Anterior Cleanser Soap and Water Discharge Instruction: May shower and wash wound with dial antibacterial soap and water prior to dressing change. Wound Cleanser Discharge Instruction: Cleanse the wound with wound cleanser prior to applying a clean dressing using gauze sponges, not tissue or cotton balls. Peri-Wound Care Triamcinolone 15 (g) Discharge Instruction: Use triamcinolone 15 (g) as directed Sween Lotion (Moisturizing lotion) Discharge Instruction: Apply moisturizing lotion as directed Topical Primary Dressing KerraCel Ag Gelling Fiber Dressing, 4x5 in (silver alginate) Discharge Instruction: Apply silver alginate to wound bed as instructed Secondary Dressing Woven Gauze Sponge, Non-Sterile 4x4 in Discharge  Instruction: Apply over primary dressing as directed. ABD Pad, 8x10 Discharge Instruction: Apply over primary dressing as directed. Zetuvit Plus 4x8 in Discharge Instruction: Apply over primary dressing as directed. Secured With Compression Wrap FourPress (4 layer compression wrap) Discharge Instruction: Apply four layer compression as directed. May also use Miliken CoFlex 2 layer compression system as alternative. Unnaboot w/Calamine, 4x10 (in/yd) Discharge Instruction: Apply Unnaboot at top to secure Compression Stockings Add-Ons Electronic Signature(s) Signed: 11/10/2020 4:38:16 PM By: Deon Pilling Entered By: Deon Pilling on 11/09/2020 11:49:01 -------------------------------------------------------------------------------- Vitals Details Patient Name: Date of Service: Erica Devoid K. 11/09/2020 11:00 A M Medical Record Number: [id number] Patient Account Number: [id number] Date of Birth/Sex: Treating RN: 31-Mar-1971 (50 y.o. [name], Meta.Reding Primary Care Laporsha Grealish: [name] Other Clinician: Referring Shaelin Lalley: Treating Nylia Gavina/Extender: Judeth Porch in Treatment: 20 Vital Signs Time Taken: 11:22 Temperature (F): 98.3 Height (in): 62 Pulse (bpm): 79 Weight (lbs): 304 Respiratory Rate (breaths/min): 20 Body Mass Index (BMI): 55.6 Blood Pressure (mmHg): 165/99 Reference Range: 80 - 120 mg / dl Electronic Signature(s) Signed: 11/10/2020 4:38:16 PM By: Deon Pilling Entered By: Deon Pilling on 11/09/2020 11:47:47

## 2020-11-10 NOTE — Progress Notes (Signed)
[name], [name] ([id number]) Visit Report for 11/09/2020 SuperBill Details Patient Name: Date of Service: [name], DRUMMER 11/09/2020 Medical Record Number: [id number] Patient Account Number: [id number] Date of Birth/Sex: Treating RN: 01-Apr-1971 (50 y.o. 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 Electronic Signature(s) Signed: 11/10/2020 3:16:41 PM By: Kalman Shan DO Signed: 11/10/2020 4:38:16 PM By: Deon Pilling Entered By: Deon Pilling on 11/09/2020 11:51:00

## 2020-11-11 ENCOUNTER — Encounter: Payer: Self-pay | Admitting: *Deleted

## 2020-11-12 ENCOUNTER — Other Ambulatory Visit: Payer: Self-pay | Admitting: Family Medicine

## 2020-11-12 ENCOUNTER — Encounter (HOSPITAL_BASED_OUTPATIENT_CLINIC_OR_DEPARTMENT_OTHER): Payer: Medicare Other | Attending: Internal Medicine | Admitting: Internal Medicine

## 2020-11-12 ENCOUNTER — Other Ambulatory Visit: Payer: Self-pay

## 2020-11-12 DIAGNOSIS — L97811 Non-pressure chronic ulcer of other part of right lower leg limited to breakdown of skin: Secondary | ICD-10-CM | POA: Insufficient documentation

## 2020-11-12 DIAGNOSIS — I87333 Chronic venous hypertension (idiopathic) with ulcer and inflammation of bilateral lower extremity: Secondary | ICD-10-CM | POA: Diagnosis not present

## 2020-11-12 DIAGNOSIS — M797 Fibromyalgia: Secondary | ICD-10-CM | POA: Diagnosis not present

## 2020-11-12 DIAGNOSIS — Z833 Family history of diabetes mellitus: Secondary | ICD-10-CM | POA: Insufficient documentation

## 2020-11-12 DIAGNOSIS — I890 Lymphedema, not elsewhere classified: Secondary | ICD-10-CM | POA: Diagnosis not present

## 2020-11-12 DIAGNOSIS — L97821 Non-pressure chronic ulcer of other part of left lower leg limited to breakdown of skin: Secondary | ICD-10-CM | POA: Diagnosis not present

## 2020-11-12 DIAGNOSIS — Z8249 Family history of ischemic heart disease and other diseases of the circulatory system: Secondary | ICD-10-CM | POA: Insufficient documentation

## 2020-11-12 DIAGNOSIS — I872 Venous insufficiency (chronic) (peripheral): Secondary | ICD-10-CM | POA: Insufficient documentation

## 2020-11-12 DIAGNOSIS — M300 Polyarteritis nodosa: Secondary | ICD-10-CM | POA: Insufficient documentation

## 2020-11-12 NOTE — Telephone Encounter (Signed)
Requesting:Xanax  Contract: UDS: Last Visit:10/05/20  Next Visit:n/a Last Refill:08/20/20 30 tabs 0 refills  Norco 10-325mg  Lr 10/21/20 90 tabs 0 refills  Phentermine 37.5mg  Lr 08/07/20 90 tabs 0 refills  Please Advise

## 2020-11-12 NOTE — Progress Notes (Signed)
[name], [name] ([id number]) Visit Report for 11/12/2020 Chief Complaint Document Details Patient Name: Date of Service: HA [name] 11/12/2020 10:00 A M Medical Record Number: [id number] Patient Account Number: [id number] Date of Birth/Sex: Treating RN: 1971/03/10 (50 y.o. [name] Primary Care Provider: [name] Other Clinician: Referring Provider: Treating Provider/Extender: Judeth Porch in Treatment: 21 Information Obtained from: Patient Chief Complaint Bilateral lower extremity wounds, left greater than right Electronic Signature(s) Signed: 11/12/2020 12:26:49 PM By: Kalman Shan DO Entered By: Kalman Shan on 11/12/2020 11:56:14 -------------------------------------------------------------------------------- HPI Details Patient Name: Date of Service: [name]. 11/12/2020 10:00 A M Medical Record Number: [id number] Patient Account Number: [id number] Date of Birth/Sex: Treating RN: 09-27-1970 (50 y.o. [name], Tammi Klippel Primary Care Provider: [name] Other Clinician: Referring Provider: Treating Provider/Extender: Judeth Porch in Treatment: 21 History of Present Illness HPI Description: Ms. Stanaland is a 50 year old female with a past medical history of psoriasis, hyperlipidemia, chronic lower extremity edema as described, cutaneous polyarteritis nodosa, sleep apnea, fibromyalgia, and osteoarthritis of both knees. She was recently followed in our clinic in February for her lower extremity wounds due to chronic venous insufficiency and lymphedema. She was treated with compression wraps and Silver alginate And occasionally with Hydrofera Blue. These closed after a couple months of treatment. During that time she received juxta lite compressions. She was ordered lymphedema pumps to but these had not arrived prior to her wound closure. Today she presents with reopening of her lower extremity  wounds. She has still not received her lymphedema pumps. She states she has been using juxta lite compressions daily. She denies acute signs of infection. 5/27; patient presents for follow-up. She was last seen 2 weeks ago. She could not make it to her last week appointment due to feeling sick. She left the wraps on for over a week and now is feeling increased pain to the lower extremities. She took them off 5 days ago and has noticed increased swelling. She has tried using her juxta light compression. 6/8; patient presents for 1 week follow-up. She finished Keflex and states that the pain in her legs has resolved and she feels much better. She has tolerated 3 layer compression with silver alginate. She denies signs of infection. 6/15; the patient I do not usually follow-up. She has severe stage III lymphedema. She took her wraps off 3 days ago because she was soaking through. She called for an earlier appointment however we could not fit her in. She has several small areas mostly on the right lateral with weeping edema fluid. She also notes that she was bitten by an insect on the posterior leg. She now has a lot of erythema mostly in the dorsal foot and ankle. Warm and tender. I note that she only recently seems to have finished Keflex 6/30; Patient presents for follow-up. She was last seen because she was having increased swelling and weeping to her lower extremities. She was also bit by an insect that may have provoked this. She reports taking doxycycline. Today she reports significant improvement in her leg swelling bilaterally with no weeping Electronic Signature(s) Signed: 11/12/2020 12:26:49 PM By: Kalman Shan DO Entered By: Kalman Shan on 11/12/2020 11:59:18 -------------------------------------------------------------------------------- Physical Exam Details Patient Name: Date of Service: Erica Devoid K. 11/12/2020 10:00 A M Medical Record Number: [id number] Patient Account  Number: [id number] Date of Birth/Sex: Treating RN: [birth year] (50 y.o. [name] Primary Care Provider: [name] Other Clinician: Referring Provider:  Treating Provider/Extender: Judeth Porch in Treatment: 21 Constitutional respirations regular, non-labored and within target range for patient.. Cardiovascular 2+ dorsalis pedis/posterior tibialis pulses. Psychiatric pleasant and cooperative. Notes Bilateral lower extremities with venous stasis dermatitis. Very minimal weeping bilaterally. Good edema control. No signs of infection. Electronic Signature(s) Signed: 11/12/2020 12:26:49 PM By: Kalman Shan DO Entered By: Kalman Shan on 11/12/2020 12:05:36 -------------------------------------------------------------------------------- Physician Orders Details Patient Name: Date of Service: Erica Devoid K. 11/12/2020 10:00 A M Medical Record Number: [id number] Patient Account Number: [id number] Date of Birth/Sex: Treating RN: Apr 29, 1971 (50 y.o. [name] Primary Care Provider: [name] Other Clinician: Referring Provider: Treating Provider/Extender: Judeth Porch in Treatment: 21 Verbal / Phone Orders: No Diagnosis Coding ICD-10 Coding Code Description [id number] Chronic venous hypertension (idiopathic) with ulcer and inflammation of bilateral lower extremity L97.821 Non-pressure chronic ulcer of other part of left lower leg limited to breakdown of skin L97.811 Non-pressure chronic ulcer of other part of right lower leg limited to breakdown of skin L03.115 Cellulitis of right lower limb I89.0 Lymphedema, not elsewhere classified M30.0 Polyarteritis nodosa Follow-up Appointments ppointment in 1 week. - Dr. Heber Sherman Return A Bathing/ Shower/ Hygiene May shower with protection but do not get wound dressing(s) wet. - May use cast protectors...you can find those at [facility] or [facility] Edema Control -  Lymphedema / SCD / Other Lymphedema Pumps. Use Lymphedema pumps on leg(s) 2-3 times a day for 45-60 minutes. If wearing any wraps or hose, do not remove them. Continue exercising as instructed. - Keep phone line open for [employer] to schedule an appointment to set up pumps at home. Elevate legs to the level of the heart or above for 30 minutes daily and/or when sitting, a frequency of: Avoid standing for long periods of time. Exercise regularly Non Wound Condition Left Lower Extremity Other Non Wound Condition Orders/Instructions: - Apply TCA/Lotion and fourpress compression with Unna at top. Wound Treatment Wound #11 - Lower Leg Wound Laterality: Right, Posterior Cleanser: Soap and Water 1 x Per Week/30 Days Discharge Instructions: May shower and wash wound with dial antibacterial soap and water prior to dressing change. Cleanser: Wound Cleanser 1 x Per Week/30 Days Discharge Instructions: Cleanse the wound with wound cleanser prior to applying a clean dressing using gauze sponges, not tissue or cotton balls. Peri-Wound Care: Triamcinolone 15 (g) 1 x Per Week/30 Days Discharge Instructions: Use triamcinolone 15 (g) as directed Peri-Wound Care: Sween Lotion (Moisturizing lotion) 1 x Per Week/30 Days Discharge Instructions: Apply moisturizing lotion as directed Prim Dressing: KerraCel Ag Gelling Fiber Dressing, 4x5 in (silver alginate) 1 x Per Week/30 Days ary Discharge Instructions: Apply silver alginate to wound bed as instructed Secondary Dressing: Woven Gauze Sponge, Non-Sterile 4x4 in 1 x Per Week/30 Days Discharge Instructions: Apply over primary dressing as directed. Secondary Dressing: ABD Pad, 8x10 1 x Per Week/30 Days Discharge Instructions: Apply over primary dressing as directed. Secondary Dressing: Zetuvit Plus 4x8 in 1 x Per Week/30 Days Discharge Instructions: Apply over primary dressing as directed. Compression Wrap: FourPress (4 layer compression wrap) 1 x Per Week/30  Days Discharge Instructions: Apply four layer compression as directed. May also use Miliken CoFlex 2 layer compression system as alternative. Compression Wrap: Unnaboot w/Calamine, 4x10 (in/yd) 1 x Per Week/30 Days Discharge Instructions: Apply Unnaboot at top to secure Wound #12 - Lower Leg Wound Laterality: Right, Anterior, Proximal Cleanser: Soap and Water 1 x Per Week/30 Days Discharge Instructions: May shower and wash wound with dial antibacterial  soap and water prior to dressing change. Cleanser: Wound Cleanser 1 x Per Week/30 Days Discharge Instructions: Cleanse the wound with wound cleanser prior to applying a clean dressing using gauze sponges, not tissue or cotton balls. Peri-Wound Care: Triamcinolone 15 (g) 1 x Per Week/30 Days Discharge Instructions: Use triamcinolone 15 (g) as directed Peri-Wound Care: Sween Lotion (Moisturizing lotion) 1 x Per Week/30 Days Discharge Instructions: Apply moisturizing lotion as directed Prim Dressing: KerraCel Ag Gelling Fiber Dressing, 4x5 in (silver alginate) 1 x Per Week/30 Days ary Discharge Instructions: Apply silver alginate to wound bed as instructed Secondary Dressing: Woven Gauze Sponge, Non-Sterile 4x4 in 1 x Per Week/30 Days Discharge Instructions: Apply over primary dressing as directed. Secondary Dressing: ABD Pad, 8x10 1 x Per Week/30 Days Discharge Instructions: Apply over primary dressing as directed. Secondary Dressing: Zetuvit Plus 4x8 in 1 x Per Week/30 Days Discharge Instructions: Apply over primary dressing as directed. Compression Wrap: FourPress (4 layer compression wrap) 1 x Per Week/30 Days Discharge Instructions: Apply four layer compression as directed. May also use Miliken CoFlex 2 layer compression system as alternative. Compression Wrap: Unnaboot w/Calamine, 4x10 (in/yd) 1 x Per Week/30 Days Discharge Instructions: Apply Unnaboot at top to secure Wound #13 - Lower Leg Wound Laterality: Right, Anterior Cleanser:  Soap and Water 1 x Per Week/30 Days Discharge Instructions: May shower and wash wound with dial antibacterial soap and water prior to dressing change. Cleanser: Wound Cleanser 1 x Per Week/30 Days Discharge Instructions: Cleanse the wound with wound cleanser prior to applying a clean dressing using gauze sponges, not tissue or cotton balls. Peri-Wound Care: Triamcinolone 15 (g) 1 x Per Week/30 Days Discharge Instructions: Use triamcinolone 15 (g) as directed Peri-Wound Care: Sween Lotion (Moisturizing lotion) 1 x Per Week/30 Days Discharge Instructions: Apply moisturizing lotion as directed Prim Dressing: KerraCel Ag Gelling Fiber Dressing, 4x5 in (silver alginate) 1 x Per Week/30 Days ary Discharge Instructions: Apply silver alginate to wound bed as instructed Secondary Dressing: Woven Gauze Sponge, Non-Sterile 4x4 in 1 x Per Week/30 Days Discharge Instructions: Apply over primary dressing as directed. Secondary Dressing: ABD Pad, 8x10 1 x Per Week/30 Days Discharge Instructions: Apply over primary dressing as directed. Secondary Dressing: Zetuvit Plus 4x8 in 1 x Per Week/30 Days Discharge Instructions: Apply over primary dressing as directed. Compression Wrap: FourPress (4 layer compression wrap) 1 x Per Week/30 Days Discharge Instructions: Apply four layer compression as directed. May also use Miliken CoFlex 2 layer compression system as alternative. Compression Wrap: Unnaboot w/Calamine, 4x10 (in/yd) 1 x Per Week/30 Days Discharge Instructions: Apply Unnaboot at top to secure Electronic Signature(s) Signed: 11/12/2020 12:26:49 PM By: Kalman Shan DO Entered By: Kalman Shan on 11/12/2020 12:25:37 -------------------------------------------------------------------------------- Problem List Details Patient Name: Date of Service: Erica Devoid K. 11/12/2020 10:00 A M Medical Record Number: [id number] Patient Account Number: [id number] Date of Birth/Sex: Treating RN: 1971-05-03  (50 y.o. [name], Meta.Reding Primary Care Provider: [name] Other Clinician: Referring Provider: Treating Provider/Extender: Judeth Porch in Treatment: 21 Active Problems ICD-10 Encounter Code Description Active Date MDM Diagnosis I87.333 Chronic venous hypertension (idiopathic) with ulcer and inflammation of 06/16/2020 No Yes bilateral lower extremity L97.821 Non-pressure chronic ulcer of other part of left lower leg limited to breakdown 06/16/2020 No Yes of skin L97.811 Non-pressure chronic ulcer of other part of right lower leg limited to breakdown 06/16/2020 No Yes of skin L03.115 Cellulitis of right lower limb 11/06/2020 No Yes I89.0 Lymphedema, not elsewhere classified 06/16/2020 No Yes M30.0  Polyarteritis nodosa 06/16/2020 No Yes Inactive Problems Resolved Problems ICD-10 Code Description Active Date Resolved Date L03.116 Cellulitis of left lower limb 07/07/2020 07/07/2020 L03.119 Cellulitis of unspecified part of limb 10/09/2020 10/09/2020 Electronic Signature(s) Signed: 11/12/2020 12:26:49 PM By: Kalman Shan DO Entered By: Kalman Shan on 11/12/2020 11:55:50 -------------------------------------------------------------------------------- Progress Note Details Patient Name: Date of Service: [name]. 11/12/2020 10:00 A M Medical Record Number: [id number] Patient Account Number: [id number] Date of Birth/Sex: Treating RN: [birth year] (50 y.o. [name] Primary Care Provider: [name] Other Clinician: Referring Provider: Treating Provider/Extender: Judeth Porch in Treatment: 21 Subjective Chief Complaint Information obtained from Patient Bilateral lower extremity wounds, left greater than right History of Present Illness (HPI) Ms. Dawn is a 50 year old female with a past medical history of psoriasis, hyperlipidemia, chronic lower extremity edema as described, cutaneous polyarteritis nodosa,  sleep apnea, fibromyalgia, and osteoarthritis of both knees. She was recently followed in our clinic in February for her lower extremity wounds due to chronic venous insufficiency and lymphedema. She was treated with compression wraps and Silver alginate And occasionally with Hydrofera Blue. These closed after a couple months of treatment. During that time she received juxta lite compressions. She was ordered lymphedema pumps to but these had not arrived prior to her wound closure. Today she presents with reopening of her lower extremity wounds. She has still not received her lymphedema pumps. She states she has been using juxta lite compressions daily. She denies acute signs of infection. 5/27; patient presents for follow-up. She was last seen 2 weeks ago. She could not make it to her last week appointment due to feeling sick. She left the wraps on for over a week and now is feeling increased pain to the lower extremities. She took them off 5 days ago and has noticed increased swelling. She has tried using her juxta light compression. 6/8; patient presents for 1 week follow-up. She finished Keflex and states that the pain in her legs has resolved and she feels much better. She has tolerated 3 layer compression with silver alginate. She denies signs of infection. 6/15; the patient I do not usually follow-up. She has severe stage III lymphedema. She took her wraps off 3 days ago because she was soaking through. She called for an earlier appointment however we could not fit her in. She has several small areas mostly on the right lateral with weeping edema fluid. She also notes that she was bitten by an insect on the posterior leg. She now has a lot of erythema mostly in the dorsal foot and ankle. Warm and tender. I note that she only recently seems to have finished Keflex 6/30; Patient presents for follow-up. She was last seen because she was having increased swelling and weeping to her lower  extremities. She was also bit by an insect that may have provoked this. She reports taking doxycycline. Today she reports significant improvement in her leg swelling bilaterally with no weeping Patient History Information obtained from Patient. Family History Cancer - Father,Maternal Grandparents, Diabetes - Mother, Heart Disease - Father, Hypertension - Mother, Lung Disease - Mother, No family history of Hereditary Spherocytosis. Social History Never smoker, Marital Status - Single, Alcohol Use - Never, Drug Use - No History, Caffeine Use - Rarely. Medical History Hematologic/Lymphatic Patient has history of Lymphedema - lower legs Respiratory Patient has history of Sleep Apnea Cardiovascular Patient has history of Hypertension, Peripheral Venous Disease Musculoskeletal Patient has history of Osteoarthritis Psychiatric Patient has history of  Confinement Anxiety Medical A Surgical History Notes nd Cardiovascular Hyperlipidemia Musculoskeletal polyarthritis nodosa, fibromyalgia Objective Constitutional respirations regular, non-labored and within target range for patient.. Vitals Time Taken: 10:39 AM, Height: 62 in, Weight: 304 lbs, BMI: 55.6, Temperature: 98.5 F, Pulse: 78 bpm, Respiratory Rate: 20 breaths/min, Blood Pressure: 124/79 mmHg. Cardiovascular 2+ dorsalis pedis/posterior tibialis pulses. Psychiatric pleasant and cooperative. General Notes: Bilateral lower extremities with venous stasis dermatitis. Very minimal weeping bilaterally. Good edema control. No signs of infection. Integumentary (Hair, Skin) Wound #10 status is Healed - Epithelialized. Original cause of wound was Gradually Appeared. The date acquired was: 09/08/2020. The wound has been in treatment 7 weeks. The wound is located on the Left,Lateral Lower Leg. The wound measures 0cm length x 0cm width x 0cm depth; 0cm^2 area and 0cm^3 volume. Wound #11 status is Open. Original cause of wound was Gradually  Appeared. The date acquired was: 09/08/2020. The wound has been in treatment 7 weeks. The wound is located on the Right,Posterior Lower Leg. The wound measures 0.1cm length x 0.1cm width x 0.1cm depth; 0.008cm^2 area and 0.001cm^3 volume. Wound #12 status is Open. Original cause of wound was Gradually Appeared. The date acquired was: 09/08/2020. The wound has been in treatment 7 weeks. The wound is located on the Right,Proximal,Anterior Lower Leg. The wound measures 0.5cm length x 0.6cm width x 0.1cm depth; 0.236cm^2 area and 0.024cm^3 volume. Wound #13 status is Open. Original cause of wound was Gradually Appeared. The date acquired was: 09/08/2020. The wound has been in treatment 7 weeks. The wound is located on the Right,Anterior Lower Leg. The wound measures 1cm length x 0.4cm width x 0.1cm depth; 0.314cm^2 area and 0.031cm^3 volume. Assessment Active Problems ICD-10 Chronic venous hypertension (idiopathic) with ulcer and inflammation of bilateral lower extremity Non-pressure chronic ulcer of other part of left lower leg limited to breakdown of skin Non-pressure chronic ulcer of other part of right lower leg limited to breakdown of skin Cellulitis of right lower limb Lymphedema, not elsewhere classified Polyarteritis nodosa The patient is doing well today. No signs of infection. Edema is well controlled with the 4-layer compression wraps. We will continue this bilaterally and add silver alginate to any open areas that are still weeping. Overall significantly improved from previous clinic visits. Procedures Wound #11 Pre-procedure diagnosis of Wound #11 is a Lymphedema located on the Right,Posterior Lower Leg . There was a Four Layer Compression Therapy Procedure by Lorrin Jackson, RN. Post procedure Diagnosis Wound #11: Same as Pre-Procedure Wound #12 Pre-procedure diagnosis of Wound #12 is a Lymphedema located on the Right,Proximal,Anterior Lower Leg . There was a Four Layer Compression  Therapy Procedure by Lorrin Jackson, RN. Post procedure Diagnosis Wound #12: Same as Pre-Procedure Wound #13 Pre-procedure diagnosis of Wound #13 is a Lymphedema located on the Right,Anterior Lower Leg . There was a Four Layer Compression Therapy Procedure by Lorrin Jackson, RN. Post procedure Diagnosis Wound #13: Same as Pre-Procedure There was a Four Layer Compression Therapy Procedure by Lorrin Jackson, RN. Post procedure Diagnosis Wound #: Same as Pre-Procedure Plan Follow-up Appointments: Return Appointment in 1 week. - Dr. Heber [place] Bathing/ Shower/ Hygiene: May shower with protection but do not get wound dressing(s) wet. - May use cast protectors...you can find those at [facility] or [facility] Edema Control - Lymphedema / SCD / Other: Lymphedema Pumps. Use Lymphedema pumps on leg(s) 2-3 times a day for 45-60 minutes. If wearing any wraps or hose, do not remove them. Continue exercising as instructed. - Keep phone line open for  [employer] to schedule an appointment to set up pumps at home. Elevate legs to the level of the heart or above for 30 minutes daily and/or when sitting, a frequency of: Avoid standing for long periods of time. Exercise regularly Non Wound Condition: Other Non Wound Condition Orders/Instructions: - Apply TCA/Lotion and fourpress compression with Unna at top. WOUND #11: - Lower Leg Wound Laterality: Right, Posterior Cleanser: Soap and Water 1 x Per Week/30 Days Discharge Instructions: May shower and wash wound with dial antibacterial soap and water prior to dressing change. Cleanser: Wound Cleanser 1 x Per Week/30 Days Discharge Instructions: Cleanse the wound with wound cleanser prior to applying a clean dressing using gauze sponges, not tissue or cotton balls. Peri-Wound Care: Triamcinolone 15 (g) 1 x Per Week/30 Days Discharge Instructions: Use triamcinolone 15 (g) as directed Peri-Wound Care: Sween Lotion (Moisturizing lotion) 1 x Per Week/30 Days Discharge  Instructions: Apply moisturizing lotion as directed Prim Dressing: KerraCel Ag Gelling Fiber Dressing, 4x5 in (silver alginate) 1 x Per Week/30 Days ary Discharge Instructions: Apply silver alginate to wound bed as instructed Secondary Dressing: Woven Gauze Sponge, Non-Sterile 4x4 in 1 x Per Week/30 Days Discharge Instructions: Apply over primary dressing as directed. Secondary Dressing: ABD Pad, 8x10 1 x Per Week/30 Days Discharge Instructions: Apply over primary dressing as directed. Secondary Dressing: Zetuvit Plus 4x8 in 1 x Per Week/30 Days Discharge Instructions: Apply over primary dressing as directed. Com pression Wrap: FourPress (4 layer compression wrap) 1 x Per Week/30 Days Discharge Instructions: Apply four layer compression as directed. May also use Miliken CoFlex 2 layer compression system as alternative. Com pression Wrap: Unnaboot w/Calamine, 4x10 (in/yd) 1 x Per Week/30 Days Discharge Instructions: Apply Unnaboot at top to secure WOUND #12: - Lower Leg Wound Laterality: Right, Anterior, Proximal Cleanser: Soap and Water 1 x Per Week/30 Days Discharge Instructions: May shower and wash wound with dial antibacterial soap and water prior to dressing change. Cleanser: Wound Cleanser 1 x Per Week/30 Days Discharge Instructions: Cleanse the wound with wound cleanser prior to applying a clean dressing using gauze sponges, not tissue or cotton balls. Peri-Wound Care: Triamcinolone 15 (g) 1 x Per Week/30 Days Discharge Instructions: Use triamcinolone 15 (g) as directed Peri-Wound Care: Sween Lotion (Moisturizing lotion) 1 x Per Week/30 Days Discharge Instructions: Apply moisturizing lotion as directed Prim Dressing: KerraCel Ag Gelling Fiber Dressing, 4x5 in (silver alginate) 1 x Per Week/30 Days ary Discharge Instructions: Apply silver alginate to wound bed as instructed Secondary Dressing: Woven Gauze Sponge, Non-Sterile 4x4 in 1 x Per Week/30 Days Discharge Instructions: Apply  over primary dressing as directed. Secondary Dressing: ABD Pad, 8x10 1 x Per Week/30 Days Discharge Instructions: Apply over primary dressing as directed. Secondary Dressing: Zetuvit Plus 4x8 in 1 x Per Week/30 Days Discharge Instructions: Apply over primary dressing as directed. Com pression Wrap: FourPress (4 layer compression wrap) 1 x Per Week/30 Days Discharge Instructions: Apply four layer compression as directed. May also use Miliken CoFlex 2 layer compression system as alternative. Com pression Wrap: Unnaboot w/Calamine, 4x10 (in/yd) 1 x Per Week/30 Days Discharge Instructions: Apply Unnaboot at top to secure WOUND #13: - Lower Leg Wound Laterality: Right, Anterior Cleanser: Soap and Water 1 x Per Week/30 Days Discharge Instructions: May shower and wash wound with dial antibacterial soap and water prior to dressing change. Cleanser: Wound Cleanser 1 x Per Week/30 Days Discharge Instructions: Cleanse the wound with wound cleanser prior to applying a clean dressing using gauze sponges, not tissue or  cotton balls. Peri-Wound Care: Triamcinolone 15 (g) 1 x Per Week/30 Days Discharge Instructions: Use triamcinolone 15 (g) as directed Peri-Wound Care: Sween Lotion (Moisturizing lotion) 1 x Per Week/30 Days Discharge Instructions: Apply moisturizing lotion as directed Prim Dressing: KerraCel Ag Gelling Fiber Dressing, 4x5 in (silver alginate) 1 x Per Week/30 Days ary Discharge Instructions: Apply silver alginate to wound bed as instructed Secondary Dressing: Woven Gauze Sponge, Non-Sterile 4x4 in 1 x Per Week/30 Days Discharge Instructions: Apply over primary dressing as directed. Secondary Dressing: ABD Pad, 8x10 1 x Per Week/30 Days Discharge Instructions: Apply over primary dressing as directed. Secondary Dressing: Zetuvit Plus 4x8 in 1 x Per Week/30 Days Discharge Instructions: Apply over primary dressing as directed. Compression Wrap: FourPress (4 layer compression wrap) 1 x Per  Week/30 Days Discharge Instructions: Apply four layer compression as directed. May also use Miliken CoFlex 2 layer compression system as alternative. Compression Wrap: Unnaboot w/Calamine, 4x10 (in/yd) 1 x Per Week/30 Days Discharge Instructions: Apply Unnaboot at top to secure 1. Continue silver alginate with 4-layer compression 2. Follow-up in 1 week Electronic Signature(s) Signed: 11/12/2020 12:26:49 PM By: Kalman Shan DO Entered By: Kalman Shan on 11/12/2020 12:26:20 -------------------------------------------------------------------------------- HxROS Details Patient Name: Date of Service: Erica Devoid K. 11/12/2020 10:00 A M Medical Record Number: [id number] Patient Account Number: [id number] Date of Birth/Sex: Treating RN: 1970-11-10 (50 y.o. [name], Tammi Klippel Primary Care Provider: [name] Other Clinician: Referring Provider: Treating Provider/Extender: Judeth Porch in Treatment: 21 Information Obtained From Patient Hematologic/Lymphatic Medical History: Positive for: Lymphedema - lower legs Respiratory Medical History: Positive for: Sleep Apnea Cardiovascular Medical History: Positive for: Hypertension; Peripheral Venous Disease Past Medical History Notes: Hyperlipidemia Musculoskeletal Medical History: Positive for: Osteoarthritis Past Medical History Notes: polyarthritis nodosa, fibromyalgia Psychiatric Medical History: Positive for: Confinement Anxiety Immunizations Pneumococcal Vaccine: Received Pneumococcal Vaccination: Yes Implantable Devices None Family and Social History Cancer: Yes - Father,Maternal Grandparents; Diabetes: Yes - Mother; Heart Disease: Yes - Father; Hereditary Spherocytosis: No; Hypertension: Yes - Mother; Lung Disease: Yes - Mother; Never smoker; Marital Status - Single; Alcohol Use: Never; Drug Use: No History; Caffeine Use: Rarely; Financial Concerns: No; Food, Clothing or Shelter  Needs: No; Support System Lacking: No; Transportation Concerns: No Electronic Signature(s) Signed: 11/12/2020 12:26:49 PM By: Kalman Shan DO Signed: 11/12/2020 6:57:58 PM By: Deon Pilling Entered By: Kalman Shan on 11/12/2020 11:59:24 -------------------------------------------------------------------------------- SuperBill Details Patient Name: Date of Service: [name] 11/12/2020 Medical Record Number: [id number] Patient Account Number: [id number] Date of Birth/Sex: Treating RN: 14-Jan-1971 (50 y.o. [name] Primary Care Provider: [name] Other Clinician: Referring Provider: Treating Provider/Extender: Judeth Porch in Treatment: 21 Diagnosis Coding ICD-10 Codes Code Description [phone] Chronic venous hypertension (idiopathic) with ulcer and inflammation of bilateral lower extremity L97.821 Non-pressure chronic ulcer of other part of left lower leg limited to breakdown of skin L97.811 Non-pressure chronic ulcer of other part of right lower leg limited to breakdown of skin L03.115 Cellulitis of right lower limb I89.0 Lymphedema, not elsewhere classified M30.0 Polyarteritis nodosa Facility Procedures CPT4: Code [id number] 29 fo Description: 073 BILATERAL: Application of multi-layer venous compression system; leg (below knee), including ankle and ot. ICD-10 Diagnosis Description I89.0 Lymphedema, not elsewhere classified Modifier: Quantity: 1 Physician Procedures : CPT4 Code Description Modifier [id number] - WC PHYS LEVEL 3 - EST PT ICD-10 Diagnosis Description I87.333 Chronic venous hypertension (idiopathic) with ulcer and inflammation of bilateral lower extremity L97.821 Non-pressure chronic ulcer of other  part of  left lower leg limited to breakdown of skin L97.811 Non-pressure chronic ulcer of other part of right lower leg limited to breakdown of skin Quantity: 1 : 22449 BILATERAL: Application of multi-layer venous  compression system; leg (below knee), including ankle and foot. ICD-10 Diagnosis Description I89.0 Lymphedema, not elsewhere classified Quantity: 1 Electronic Signature(s) Signed: 11/12/2020 12:26:49 PM By: Kalman Shan DO Entered By: Kalman Shan on 11/12/2020 12:26:26

## 2020-11-13 ENCOUNTER — Encounter (HOSPITAL_BASED_OUTPATIENT_CLINIC_OR_DEPARTMENT_OTHER): Payer: Medicare Other | Admitting: Internal Medicine

## 2020-11-13 ENCOUNTER — Telehealth: Payer: Medicare Other

## 2020-11-13 NOTE — Unmapped (Signed)
[facility] Refill Coordination Note    Specialty Medication(s) to be Shipped:   Inflammatory Disorders: Skyrizi    Other medication(s) to be shipped: No additional medications requested for fill at this time     Autumn Mcdonald, DOB: [birth year]  Phone: [phone] (home)       All above HIPAA information was verified with patient.     Was a Nurse, learning disability used for this call? No    Completed refill call assessment today to schedule patient's medication shipment from the [facility] [phone]).  All relevant notes have been reviewed.     Specialty medication(s) and dose(s) confirmed: Regimen is correct and unchanged.   Changes to medications: [employer] reports no changes at this time.  Changes to insurance: No  New side effects reported not previously addressed with a pharmacist or physician: None reported  Questions for the pharmacist: No    Confirmed patient received a Conservation officer, historic buildings and a Surveyor, mining with first shipment. The patient will receive a drug information handout for each medication shipped and additional FDA Medication Guides as required.       DISEASE/MEDICATION-SPECIFIC INFORMATION        For patients on injectable medications: Patient currently has 0 doses left.  Next injection is scheduled for 11/30/2020.    SPECIALTY MEDICATION ADHERENCE     Medication Adherence    Patient reported X missed doses in the last month: 0  Specialty Medication: Skyrizi 150 mg/ml  Patient is on additional specialty medications: No  Any gaps in refill history greater than 2 weeks in the last 3 months: no  Demonstrates understanding of importance of adherence: yes  Informant: patient  Reliability of informant: reliable  Confirmed plan for next specialty medication refill: delivery by pharmacy  Refills needed for supportive medications: not needed              Were doses missed due to medication being on hold? No    Skyrizi 150 mg/ml: 0 days of medicine on hand       REFERRAL TO PHARMACIST     Referral to the pharmacist: Not needed      [facility]     Shipping address confirmed in Epic.     Delivery Scheduled: Yes, Expected medication delivery date: 11/24/2020.     Medication will be delivered via UPS to the prescription address in Epic WAM.    [name] D Jing Howatt   [facility] Specialty Technician

## 2020-11-13 NOTE — Progress Notes (Signed)
KERRA, GUILFOIL ([id number]) Visit Report for 11/12/2020 Arrival Information Details Patient Name: Date of Service: HA JOYCIE, AERTS 11/12/2020 10:00 A M Medical Record Number: [id number] Patient Account Number: [id number] Date of Birth/Sex: Treating RN: [birth year] (50 y.o. [name], Meta.Reding Primary Care Danni Shima: [name] Other Clinician: Referring Max Romano: Treating Suni Jarnagin/Extender: [name] in Treatment: 21 Visit Information History Since Last Visit Added or deleted any medications: No Patient Arrived: Kasandra Knudsen Any new allergies or adverse reactions: No Arrival Time: 10:38 Had a fall or experienced change in No Accompanied By: self activities of daily living that may affect Transfer Assistance: None risk of falls: Patient Identification Verified: Yes Signs or symptoms of abuse/neglect since last visito No Secondary Verification Process Completed: Yes Hospitalized since last visit: No Patient Requires Transmission-Based Precautions: No Implantable device outside of the clinic excluding No Patient Has Alerts: No cellular tissue based products placed in the center since last visit: Has Dressing in Place as Prescribed: Yes Pain Present Now: No Electronic Signature(s) Signed: 11/13/2020 10:25:49 AM By: Sandre Kitty Entered By: Sandre Kitty on 11/12/2020 10:39:16 -------------------------------------------------------------------------------- Compression Therapy Details Patient Name: Date of Service: [name] 11/12/2020 10:00 A M Medical Record Number: [id number] Patient Account Number: [id number] Date of Birth/Sex: Treating RN: [birth year] (50 y.o. [name] Primary Care Betzabe Bevans: [name] Other Clinician: Referring Jaedyn Marrufo: Treating Jenya Putz/Extender: Judeth Porch in Treatment: 21 Compression Therapy Performed for Wound Assessment: Wound #11 Right,Posterior Lower Leg Performed By:  Clinician Lorrin Jackson, RN Compression Type: Four Layer Post Procedure Diagnosis Same as Pre-procedure Electronic Signature(s) Signed: 11/12/2020 6:39:25 PM By: Lorrin Jackson Entered By: Lorrin Jackson on 11/12/2020 10:52:04 -------------------------------------------------------------------------------- Compression Therapy Details Patient Name: Date of Service: [name] 11/12/2020 10:00 A M Medical Record Number: [id number] Patient Account Number: [id number] Date of Birth/Sex: Treating RN: 07-13-1970 (50 y.o. [name] Primary Care Jalilah Wiltsie: [name] Other Clinician: Referring Ellsie Violette: Treating Rowan Pollman/Extender: Judeth Porch in Treatment: 21 Compression Therapy Performed for Wound Assessment: Wound #12 Right,Proximal,Anterior Lower Leg Performed By: Clinician Lorrin Jackson, RN Compression Type: Four Layer Post Procedure Diagnosis Same as Pre-procedure Electronic Signature(s) Signed: 11/12/2020 6:39:25 PM By: Lorrin Jackson Entered By: Lorrin Jackson on 11/12/2020 10:52:04 -------------------------------------------------------------------------------- Compression Therapy Details Patient Name: Date of Service: [name] 11/12/2020 10:00 A M Medical Record Number: [id number] Patient Account Number: [id number] Date of Birth/Sex: Treating RN: 24-Jan-1971 (50 y.o. [name] Primary Care Marlowe Cinquemani: [name] Other Clinician: Referring Marcell Pfeifer: Treating Jazper Nikolai/Extender: Judeth Porch in Treatment: 21 Compression Therapy Performed for Wound Assessment: Wound #13 Right,Anterior Lower Leg Performed By: Clinician Lorrin Jackson, RN Compression Type: Four Layer Post Procedure Diagnosis Same as Pre-procedure Electronic Signature(s) Signed: 11/12/2020 6:39:25 PM By: Lorrin Jackson Entered By: Lorrin Jackson on 11/12/2020  10:52:04 -------------------------------------------------------------------------------- Compression Therapy Details Patient Name: Date of Service: [name] 11/12/2020 10:00 A M Medical Record Number: [id number] Patient Account Number: [id number] Date of Birth/Sex: Treating RN: Jun 07, 1970 (50 y.o. [name] Primary Care Jahleel Stroschein: [name] Other Clinician: Referring Katherinne Mofield: Treating Lataja Newland/Extender: Judeth Porch in Treatment: 21 Compression Therapy Performed for Wound Assessment: Non-Wound Location Performed By: Clinician Lorrin Jackson, RN Compression Type: Four Layer Location: Lower Extremity, Left Post Procedure Diagnosis Same as Pre-procedure Electronic Signature(s) Signed: 11/12/2020 6:39:25 PM By: Lorrin Jackson Entered By: Lorrin Jackson on 11/12/2020 10:52:22 -------------------------------------------------------------------------------- Encounter Discharge Information Details Patient Name: Date of Service: HA [name], Hina K. 11/12/2020 10:00  A M Medical Record Number: [id number] Patient Account Number: [id number] Date of Birth/Sex: Treating RN: 1971-05-06 (50 y.o. [name] Primary Care Ferrel Simington: [name] Other Clinician: Referring Virl Coble: Treating Sheilia Reznick/Extender: Judeth Porch in Treatment: 21 Encounter Discharge Information Items Discharge Condition: Stable Ambulatory Status: [place] Discharge Destination: Home Transportation: Private Auto Schedule Follow-up Appointment: Yes Clinical Summary of Care: Provided on 11/12/2020 Form Type Recipient Paper Patient Patient Electronic Signature(s) Signed: 11/12/2020 6:39:25 PM By: Lorrin Jackson Entered By: Lorrin Jackson on 11/12/2020 11:26:28 -------------------------------------------------------------------------------- Lower Extremity Assessment Details Patient Name: Date of Service: [name] 11/12/2020 10:00  A M Medical Record Number: [id number] Patient Account Number: [id number] Date of Birth/Sex: Treating RN: 08-Jan-1971 (50 y.o. [name] Primary Care Ion Gonnella: [name] Other Clinician: Referring Aidyn Sportsman: Treating Cate Oravec/Extender: Judeth Porch in Treatment: 21 Edema Assessment Assessed: Shirlyn Goltz: Yes] Patrice Paradise: Yes] Edema: [Left: Yes] [Right: Yes] Calf Left: Right: Point of Measurement: 28 cm From Medial Instep 51 cm 50 cm Ankle Left: Right: Point of Measurement: 11 cm From Medial Instep 29 cm 30 cm Vascular Assessment Pulses: Dorsalis Pedis Palpable: [Left:Yes] [Right:Yes] Electronic Signature(s) Signed: 11/12/2020 6:39:25 PM By: Lorrin Jackson Signed: 11/12/2020 6:39:25 PM By: Lorrin Jackson Entered By: Lorrin Jackson on 11/12/2020 10:49:47 -------------------------------------------------------------------------------- Multi Wound Chart Details Patient Name: Date of Service: [name] 11/12/2020 10:00 A M Medical Record Number: [id number] Patient Account Number: [id number] Date of Birth/Sex: Treating RN: [birth year] (50 y.o. [name], Meta.Reding Primary Care Gabriela Irigoyen: [name] Other Clinician: Referring Leavy Heatherly: Treating Selam Pietsch/Extender: Judeth Porch in Treatment: 21 Vital Signs Height(in): 62 Pulse(bpm): 78 Weight(lbs): 304 Blood Pressure(mmHg): 124/79 Body Mass Index(BMI): 56 Temperature(F): 98.5 Respiratory Rate(breaths/min): 20 Photos: [10:No Photos Left, Lateral Lower Leg] [11:No Photos Right, Posterior Lower Leg] [12:No Photos Right, Proximal, Anterior Lower Leg] Wound Location: [10:Gradually Appeared] [11:Gradually Appeared] [12:Gradually Appeared] Wounding Event: [10:Lymphedema] [11:Lymphedema] [12:Lymphedema] Primary Etiology: [10:Lymphedema, Sleep Apnea,] [11:N/A] [12:N/A] Comorbid History: [10:Hypertension, Peripheral Venous Disease, Osteoarthritis, Confinement Anxiety  09/08/2020] [11:09/08/2020] [12:09/08/2020] Date Acquired: [10:7] [11:7] [12:7] Weeks of Treatment: [10:Healed - Epithelialized] [11:Open] [12:Open] Wound Status: [10:0x0x0] [11:0.1x0.1x0.1] [12:0.5x0.6x0.1] Measurements L x W x D (cm) [10:0] [11:0.008] [12:0.236] A (cm) : rea [10:0] [11:0.001] [12:0.024] Volume (cm) : [10:100.00%] [11:99.90%] [12:69.90%] % Reduction in Area: [10:100.00%] [11:99.80%] [12:69.60%] % Reduction in Volume: [10:Full Thickness Without Exposed] [11:Full Thickness Without Exposed] [12:Full Thickness Without Exposed] Classification: [10:Support Structures N/A] [11:Support Structures Compression Therapy] [12:Support Structures Compression Therapy] Wound Number: 13 N/A N/A Photos: No Photos N/A N/A Right, Anterior Lower Leg N/A N/A Wound Location: Gradually Appeared N/A N/A Wounding Event: Lymphedema N/A N/A Primary Etiology: N/A N/A N/A Comorbid History: 09/08/2020 N/A N/A Date Acquired: 7 N/A N/A Weeks of Treatment: Open N/A N/A Wound Status: 1x0.4x0.1 N/A N/A Measurements L x W x D (cm) 0.314 N/A N/A A (cm) : rea 0.031 N/A N/A Volume (cm) : 77.30% N/A N/A % Reduction in A rea: 77.50% N/A N/A % Reduction in Volume: Full Thickness Without Exposed N/A N/A Classification: Support Structures Compression Therapy N/A N/A Procedures Performed: Treatment Notes Wound #11 (Lower Leg) Wound Laterality: Right, Posterior Cleanser Soap and Water Discharge Instruction: May shower and wash wound with dial antibacterial soap and water prior to dressing change. Wound Cleanser Discharge Instruction: Cleanse the wound with wound cleanser prior to applying a clean dressing using gauze sponges, not tissue or cotton balls. Peri-Wound Care Triamcinolone 15 (g) Discharge Instruction: Use triamcinolone 15 (g) as directed Sween Lotion (Moisturizing lotion) Discharge  Instruction: Apply moisturizing lotion as directed Topical Primary Dressing KerraCel Ag Gelling  Fiber Dressing, 4x5 in (silver alginate) Discharge Instruction: Apply silver alginate to wound bed as instructed Secondary Dressing Woven Gauze Sponge, Non-Sterile 4x4 in Discharge Instruction: Apply over primary dressing as directed. ABD Pad, 8x10 Discharge Instruction: Apply over primary dressing as directed. Zetuvit Plus 4x8 in Discharge Instruction: Apply over primary dressing as directed. Secured With Compression Wrap FourPress (4 layer compression wrap) Discharge Instruction: Apply four layer compression as directed. May also use Miliken CoFlex 2 layer compression system as alternative. Unnaboot w/Calamine, 4x10 (in/yd) Discharge Instruction: Apply Unnaboot at top to secure Compression Stockings Add-Ons Wound #12 (Lower Leg) Wound Laterality: Right, Anterior, Proximal Cleanser Soap and Water Discharge Instruction: May shower and wash wound with dial antibacterial soap and water prior to dressing change. Wound Cleanser Discharge Instruction: Cleanse the wound with wound cleanser prior to applying a clean dressing using gauze sponges, not tissue or cotton balls. Peri-Wound Care Triamcinolone 15 (g) Discharge Instruction: Use triamcinolone 15 (g) as directed Sween Lotion (Moisturizing lotion) Discharge Instruction: Apply moisturizing lotion as directed Topical Primary Dressing KerraCel Ag Gelling Fiber Dressing, 4x5 in (silver alginate) Discharge Instruction: Apply silver alginate to wound bed as instructed Secondary Dressing Woven Gauze Sponge, Non-Sterile 4x4 in Discharge Instruction: Apply over primary dressing as directed. ABD Pad, 8x10 Discharge Instruction: Apply over primary dressing as directed. Zetuvit Plus 4x8 in Discharge Instruction: Apply over primary dressing as directed. Secured With Compression Wrap FourPress (4 layer compression wrap) Discharge Instruction: Apply four layer compression as directed. May also use Miliken CoFlex 2 layer compression system as  alternative. Unnaboot w/Calamine, 4x10 (in/yd) Discharge Instruction: Apply Unnaboot at top to secure Compression Stockings Add-Ons Wound #13 (Lower Leg) Wound Laterality: Right, Anterior Cleanser Soap and Water Discharge Instruction: May shower and wash wound with dial antibacterial soap and water prior to dressing change. Wound Cleanser Discharge Instruction: Cleanse the wound with wound cleanser prior to applying a clean dressing using gauze sponges, not tissue or cotton balls. Peri-Wound Care Triamcinolone 15 (g) Discharge Instruction: Use triamcinolone 15 (g) as directed Sween Lotion (Moisturizing lotion) Discharge Instruction: Apply moisturizing lotion as directed Topical Primary Dressing KerraCel Ag Gelling Fiber Dressing, 4x5 in (silver alginate) Discharge Instruction: Apply silver alginate to wound bed as instructed Secondary Dressing Woven Gauze Sponge, Non-Sterile 4x4 in Discharge Instruction: Apply over primary dressing as directed. ABD Pad, 8x10 Discharge Instruction: Apply over primary dressing as directed. Zetuvit Plus 4x8 in Discharge Instruction: Apply over primary dressing as directed. Secured With Compression Wrap FourPress (4 layer compression wrap) Discharge Instruction: Apply four layer compression as directed. May also use Miliken CoFlex 2 layer compression system as alternative. Unnaboot w/Calamine, 4x10 (in/yd) Discharge Instruction: Apply Unnaboot at top to secure Compression Stockings Add-Ons Electronic Signature(s) Signed: 11/12/2020 12:26:49 PM By: Kalman Shan DO Signed: 11/12/2020 6:57:58 PM By: Deon Pilling Entered By: Kalman Shan on 11/12/2020 11:55:58 -------------------------------------------------------------------------------- Multi-Disciplinary Care Plan Details Patient Name: Date of Service: [name] 11/12/2020 10:00 A M Medical Record Number: [id number] Patient Account Number: [id number] Date of Birth/Sex: Treating  RN: 07-Aug-1970 (50 y.o. [name] Primary Care Remon Quinto: [name] Other Clinician: Referring Johnpaul Gillentine: Treating Bashir Marchetti/Extender: Judeth Porch in Treatment: 21 Active Inactive Abuse / Safety / Falls / Self Care Management Nursing Diagnoses: Potential for falls Goals: Patient will remain injury free related to falls Date Initiated: 09/22/2020 Target Resolution Date: 11/20/2020 Goal Status: Active Interventions: Assess fall risk on admission and as needed  Assess: immobility, friction, shearing, incontinence upon admission and as needed Assess self care needs on admission and as needed Provide education on fall prevention Notes: Venous Leg Ulcer Nursing Diagnoses: Potential for venous Insuffiency (use before diagnosis confirmed) Goals: Patient will maintain optimal edema control Date Initiated: 09/22/2020 Target Resolution Date: 11/13/2020 Goal Status: Active Interventions: Assess peripheral edema status every visit. Compression as ordered Provide education on venous insufficiency Notes: Electronic Signature(s) Signed: 11/12/2020 6:39:25 PM By: Lorrin Jackson Entered By: Lorrin Jackson on 11/12/2020 10:53:55 -------------------------------------------------------------------------------- Pain Assessment Details Patient Name: Date of Service: [name] 11/12/2020 10:00 A M Medical Record Number: [id number] Patient Account Number: [id number] Date of Birth/Sex: Treating RN: Sep 26, 1970 (50 y.o. [name] Primary Care Gordy Goar: [name] Other Clinician: Referring Sarahlynn Cisnero: Treating Monserrat Vidaurri/Extender: Judeth Porch in Treatment: 21 Active Problems Location of Pain Severity and Description of Pain Patient Has Paino No Site Locations Pain Management and Medication Current Pain Management: Electronic Signature(s) Signed: 11/12/2020 6:39:25 PM By: Lorrin Jackson Entered By: Lorrin Jackson on 11/12/2020 10:49:29 -------------------------------------------------------------------------------- Patient/Caregiver Education Details Patient Name: Date of Service: HA [name], Marija K. 6/30/2022andnbsp10:00 A M Medical Record Number: [id number] Patient Account Number: [id number] Date of Birth/Gender: Treating RN: 1970-08-02 (50 y.o. [name] Primary Care Physician: [name] Other Clinician: Referring Physician: Treating Physician/Extender: Judeth Porch in Treatment: 21 Education Assessment Education Provided To: Patient Education Topics Provided Venous: Methods: Explain/Verbal, Printed Responses: State content correctly Wound/Skin Impairment: Methods: Explain/Verbal, Printed Responses: State content correctly Electronic Signature(s) Signed: 11/12/2020 6:39:25 PM By: Lorrin Jackson Entered By: Lorrin Jackson on 11/12/2020 10:54:14 -------------------------------------------------------------------------------- Wound Assessment Details Patient Name: Date of Service: [name] 11/12/2020 10:00 A M Medical Record Number: [id number] Patient Account Number: [id number] Date of Birth/Sex: Treating RN: May 19, 1970 (50 y.o. [name], Meta.Reding Primary Care Karrissa Parchment: [name] Other Clinician: Referring Rockland Kotarski: Treating Ruthel Martine/Extender: [name] in Treatment: 21 Wound Status Wound Number: 10 Primary Lymphedema Etiology: Wound Location: Left, Lateral Lower Leg Wound Healed - Epithelialized Wounding Event: Gradually Appeared Status: Date Acquired: 09/08/2020 Comorbid Lymphedema, Sleep Apnea, Hypertension, Peripheral Venous Weeks Of Treatment: 7 History: Disease, Osteoarthritis, Confinement Anxiety Clustered Wound: No Wound Measurements Length: (cm) Width: (cm) Depth: (cm) Area: (cm) Volume: (cm) Wound Description Classification: Full Thickness Without Exposed Support  Structur es 0 % Reduction in Area: 100% 0 % Reduction in Volume: 100% 0 0 0 Electronic Signature(s) Signed: 11/12/2020 6:39:25 PM By: Lorrin Jackson Signed: 11/12/2020 6:57:58 PM By: Deon Pilling Entered By: Lorrin Jackson on 11/12/2020 10:50:25 -------------------------------------------------------------------------------- Wound Assessment Details Patient Name: Date of Service: [name] 11/12/2020 10:00 A M Medical Record Number: [id number] Patient Account Number: [id number] Date of Birth/Sex: Treating RN: 1971/02/10 (49 y.o. [name], Meta.Reding Primary Care Sylvester Minton: [name] Other Clinician: Referring Emilliano Dilworth: Treating Donnetta Gillin/Extender: [name] in Treatment: 21 Wound Status Wound Number: 11 Primary Etiology: Lymphedema Wound Location: Right, Posterior Lower Leg Wound Status: Open Wounding Event: Gradually Appeared Date Acquired: 09/08/2020 Weeks Of Treatment: 7 Clustered Wound: No Wound Measurements Length: (cm) 0.1 Width: (cm) 0.1 Depth: (cm) 0.1 Area: (cm) 0.008 Volume: (cm) 0.001 % Reduction in Area: 99.9% % Reduction in Volume: 99.8% Wound Description Classification: Full Thickness Without Exposed Support Structur es Treatment Notes Wound #11 (Lower Leg) Wound Laterality: Right, Posterior Cleanser Soap and Water Discharge Instruction: May shower and wash wound with dial antibacterial soap and water prior to dressing change. Wound Cleanser Discharge Instruction: Cleanse the wound  with wound cleanser prior to applying a clean dressing using gauze sponges, not tissue or cotton balls. Peri-Wound Care Triamcinolone 15 (g) Discharge Instruction: Use triamcinolone 15 (g) as directed Sween Lotion (Moisturizing lotion) Discharge Instruction: Apply moisturizing lotion as directed Topical Primary Dressing KerraCel Ag Gelling Fiber Dressing, 4x5 in (silver alginate) Discharge Instruction: Apply silver alginate to  wound bed as instructed Secondary Dressing Woven Gauze Sponge, Non-Sterile 4x4 in Discharge Instruction: Apply over primary dressing as directed. ABD Pad, 8x10 Discharge Instruction: Apply over primary dressing as directed. Zetuvit Plus 4x8 in Discharge Instruction: Apply over primary dressing as directed. Secured With Compression Wrap FourPress (4 layer compression wrap) Discharge Instruction: Apply four layer compression as directed. May also use Miliken CoFlex 2 layer compression system as alternative. Unnaboot w/Calamine, 4x10 (in/yd) Discharge Instruction: Apply Unnaboot at top to secure Compression Stockings Add-Ons Electronic Signature(s) Signed: 11/12/2020 6:57:58 PM By: Deon Pilling Signed: 11/13/2020 10:25:49 AM By: Sandre Kitty Entered By: Sandre Kitty on 11/12/2020 10:40:02 -------------------------------------------------------------------------------- Wound Assessment Details Patient Name: Date of Service: [name] 11/12/2020 10:00 A M Medical Record Number: [id number] Patient Account Number: [id number] Date of Birth/Sex: Treating RN: Oct 11, 1970 (50 y.o. [name], Meta.Reding Primary Care Shalunda Lindh: [name] Other Clinician: Referring Casper Pagliuca: Treating Karigan Cloninger/Extender: [name] in Treatment: 21 Wound Status Wound Number: 12 Primary Etiology: Lymphedema Wound Location: Right, Proximal, Anterior Lower Leg Wound Status: Open Wounding Event: Gradually Appeared Date Acquired: 09/08/2020 Weeks Of Treatment: 7 Clustered Wound: No Wound Measurements Length: (cm) 0.5 Width: (cm) 0.6 Depth: (cm) 0.1 Area: (cm) 0.236 Volume: (cm) 0.024 % Reduction in Area: 69.9% % Reduction in Volume: 69.6% Wound Description Classification: Full Thickness Without Exposed Support Structur es Treatment Notes Wound #12 (Lower Leg) Wound Laterality: Right, Anterior, Proximal Cleanser Soap and Water Discharge Instruction: May  shower and wash wound with dial antibacterial soap and water prior to dressing change. Wound Cleanser Discharge Instruction: Cleanse the wound with wound cleanser prior to applying a clean dressing using gauze sponges, not tissue or cotton balls. Peri-Wound Care Triamcinolone 15 (g) Discharge Instruction: Use triamcinolone 15 (g) as directed Sween Lotion (Moisturizing lotion) Discharge Instruction: Apply moisturizing lotion as directed Topical Primary Dressing KerraCel Ag Gelling Fiber Dressing, 4x5 in (silver alginate) Discharge Instruction: Apply silver alginate to wound bed as instructed Secondary Dressing Woven Gauze Sponge, Non-Sterile 4x4 in Discharge Instruction: Apply over primary dressing as directed. ABD Pad, 8x10 Discharge Instruction: Apply over primary dressing as directed. Zetuvit Plus 4x8 in Discharge Instruction: Apply over primary dressing as directed. Secured With Compression Wrap FourPress (4 layer compression wrap) Discharge Instruction: Apply four layer compression as directed. May also use Miliken CoFlex 2 layer compression system as alternative. Unnaboot w/Calamine, 4x10 (in/yd) Discharge Instruction: Apply Unnaboot at top to secure Compression Stockings Add-Ons Electronic Signature(s) Signed: 11/12/2020 6:57:58 PM By: Deon Pilling Signed: 11/13/2020 10:25:49 AM By: Sandre Kitty Entered By: Sandre Kitty on 11/12/2020 10:40:03 -------------------------------------------------------------------------------- Wound Assessment Details Patient Name: Date of Service: [name] 11/12/2020 10:00 A M Medical Record Number: [id number] Patient Account Number: [id number] Date of Birth/Sex: Treating RN: 04-07-1971 (50 y.o. [name] Primary Care Shavontae Gibeault: [name] Other Clinician: Referring Chyanna Flock: Treating Bladimir Auman/Extender: [name] in Treatment: 21 Wound Status Wound Number: 13 Primary Etiology:  Lymphedema Wound Location: Right, Anterior Lower Leg Wound Status: Open Wounding Event: Gradually Appeared Date Acquired: 09/08/2020 Weeks Of Treatment: 7 Clustered Wound: No Wound Measurements Length: (cm) 1 Width: (cm) 0.4 Depth: (cm) 0.1 Area: (  cm) 0.314 Volume: (cm) 0.031 % Reduction in Area: 77.3% % Reduction in Volume: 77.5% Wound Description Classification: Full Thickness Without Exposed Support Structur es Treatment Notes Wound #13 (Lower Leg) Wound Laterality: Right, Anterior Cleanser Soap and Water Discharge Instruction: May shower and wash wound with dial antibacterial soap and water prior to dressing change. Wound Cleanser Discharge Instruction: Cleanse the wound with wound cleanser prior to applying a clean dressing using gauze sponges, not tissue or cotton balls. Peri-Wound Care Triamcinolone 15 (g) Discharge Instruction: Use triamcinolone 15 (g) as directed Sween Lotion (Moisturizing lotion) Discharge Instruction: Apply moisturizing lotion as directed Topical Primary Dressing KerraCel Ag Gelling Fiber Dressing, 4x5 in (silver alginate) Discharge Instruction: Apply silver alginate to wound bed as instructed Secondary Dressing Woven Gauze Sponge, Non-Sterile 4x4 in Discharge Instruction: Apply over primary dressing as directed. ABD Pad, 8x10 Discharge Instruction: Apply over primary dressing as directed. Zetuvit Plus 4x8 in Discharge Instruction: Apply over primary dressing as directed. Secured With Compression Wrap FourPress (4 layer compression wrap) Discharge Instruction: Apply four layer compression as directed. May also use [employer]iken CoFlex 2 layer compression system as alternative. Unnaboot w/Calamine, 4x10 (in/yd) Discharge Instruction: Apply Unnaboot at top to secure Compression Stockings Add-Ons Electronic Signature(s) Signed: 11/12/2020 6:57:58 PM By: Deon Pilling Signed: 11/13/2020 10:25:49 AM By: Sandre Kitty Entered By: Sandre Kitty  on 11/12/2020 10:40:03 -------------------------------------------------------------------------------- Vitals Details Patient Name: Date of Service: [name]. 11/12/2020 10:00 A M Medical Record Number: [id number] Patient Account Number: [id number] Date of Birth/Sex: Treating RN: 1971-01-16 (50 y.o. [name], Meta.Reding Primary Care Delman Goshorn: [name] Other Clinician: Referring Josephmichael Lisenbee: Treating Kelis Plasse/Extender: [name] in Treatment: 21 Vital Signs Time Taken: 10:39 Temperature (F): 98.5 Height (in): 62 Pulse (bpm): 78 Weight (lbs): 304 Respiratory Rate (breaths/min): 20 Body Mass Index (BMI): 55.6 Blood Pressure (mmHg): 124/79 Reference Range: 80 - 120 mg / dl Electronic Signature(s) Signed: 11/13/2020 10:25:49 AM By: Sandre Kitty Entered By: Sandre Kitty on 11/12/2020 10:39:35

## 2020-11-14 ENCOUNTER — Other Ambulatory Visit: Payer: Self-pay | Admitting: Family Medicine

## 2020-11-18 ENCOUNTER — Other Ambulatory Visit: Payer: Self-pay | Admitting: Family Medicine

## 2020-11-19 ENCOUNTER — Other Ambulatory Visit: Payer: Self-pay | Admitting: Family Medicine

## 2020-11-19 ENCOUNTER — Encounter: Payer: Self-pay | Admitting: Family Medicine

## 2020-11-20 ENCOUNTER — Encounter (HOSPITAL_BASED_OUTPATIENT_CLINIC_OR_DEPARTMENT_OTHER): Payer: Medicare Other | Attending: Internal Medicine | Admitting: Internal Medicine

## 2020-11-20 ENCOUNTER — Telehealth: Payer: Self-pay

## 2020-11-20 ENCOUNTER — Telehealth: Payer: Medicare Other

## 2020-11-20 ENCOUNTER — Other Ambulatory Visit: Payer: Self-pay

## 2020-11-20 DIAGNOSIS — M300 Polyarteritis nodosa: Secondary | ICD-10-CM | POA: Insufficient documentation

## 2020-11-20 DIAGNOSIS — I890 Lymphedema, not elsewhere classified: Secondary | ICD-10-CM | POA: Diagnosis not present

## 2020-11-20 DIAGNOSIS — L97811 Non-pressure chronic ulcer of other part of right lower leg limited to breakdown of skin: Secondary | ICD-10-CM | POA: Diagnosis not present

## 2020-11-20 DIAGNOSIS — I872 Venous insufficiency (chronic) (peripheral): Secondary | ICD-10-CM | POA: Insufficient documentation

## 2020-11-20 DIAGNOSIS — M170 Bilateral primary osteoarthritis of knee: Secondary | ICD-10-CM | POA: Insufficient documentation

## 2020-11-20 DIAGNOSIS — L97821 Non-pressure chronic ulcer of other part of left lower leg limited to breakdown of skin: Secondary | ICD-10-CM | POA: Diagnosis not present

## 2020-11-20 DIAGNOSIS — M797 Fibromyalgia: Secondary | ICD-10-CM | POA: Insufficient documentation

## 2020-11-20 DIAGNOSIS — I87333 Chronic venous hypertension (idiopathic) with ulcer and inflammation of bilateral lower extremity: Secondary | ICD-10-CM

## 2020-11-20 NOTE — Telephone Encounter (Signed)
Called and spoke to patient in regards to her dad which is our patient who is in need of a scooter. I informed daughter that we do not have a box to check for a scooter but do have a motorized wheelchair as a choice. Daughter stated that the motorized wheelchair would be fine. Order has been placed for the wheelchair.

## 2020-11-23 MED FILL — SKYRIZI 150 MG/ML SUBCUTANEOUS SYRINGE: 84 days supply | Qty: 1 | Fill #1

## 2020-11-24 NOTE — Progress Notes (Signed)
ALAYASIA, BREEDING ([id number]) Visit Report for 11/20/2020 Chief Complaint Document Details Patient Name: Date of Service: [name], [name] 11/20/2020 12:30 PM Medical Record Number: [id number] Patient Account Number: [id number] Date of Birth/Sex: Treating RN: 1970/12/15 (50 y.o. [name] Primary Care Provider: [name] Other Clinician: Referring Provider: Treating Provider/Extender: Judeth Porch in Treatment: 22 Information Obtained from: Patient Chief Complaint Bilateral lower extremity wounds, left greater than right Electronic Signature(s) Signed: 11/20/2020 1:40:51 PM By: Kalman Shan DO Entered By: Kalman Shan on 11/20/2020 13:36:49 -------------------------------------------------------------------------------- HPI Details Patient Name: Date of Service: Erica Gong. 11/20/2020 12:30 PM Medical Record Number: [id number] Patient Account Number: [id number] Date of Birth/Sex: Treating RN: 24-Jun-1970 (50 y.o. [name] Primary Care Provider: [name] Other Clinician: Referring Provider: Treating Provider/Extender: Judeth Porch in Treatment: 38 History of Present Illness HPI Description: Ms. Goller is a 50 year old female with a past medical history of psoriasis, hyperlipidemia, chronic lower extremity edema as described, cutaneous polyarteritis nodosa, sleep apnea, fibromyalgia, and osteoarthritis of both knees. She was recently followed in our clinic in February for her lower extremity wounds due to chronic venous insufficiency and lymphedema. She was treated with compression wraps and Silver alginate And occasionally with Hydrofera Blue. These closed after a couple months of treatment. During that time she received juxta lite compressions. She was ordered lymphedema pumps to but these had not arrived prior to her wound closure. Today she presents with reopening of her lower extremity wounds.  She has still not received her lymphedema pumps. She states she has been using juxta lite compressions daily. She denies acute signs of infection. 5/27; patient presents for follow-up. She was last seen 2 weeks ago. She could not make it to her last week appointment due to feeling sick. She left the wraps on for over a week and now is feeling increased pain to the lower extremities. She took them off 5 days ago and has noticed increased swelling. She has tried using her juxta light compression. 6/8; patient presents for 1 week follow-up. She finished Keflex and states that the pain in her legs has resolved and she feels much better. She has tolerated 3 layer compression with silver alginate. She denies signs of infection. 6/15; the patient I do not usually follow-up. She has severe stage III lymphedema. She took her wraps off 3 days ago because she was soaking through. She called for an earlier appointment however we could not fit her in. She has several small areas mostly on the right lateral with weeping edema fluid. She also notes that she was bitten by an insect on the posterior leg. She now has a lot of erythema mostly in the dorsal foot and ankle. Warm and tender. I note that she only recently seems to have finished Keflex 6/30; Patient presents for follow-up. She was last seen because she was having increased swelling and weeping to her lower extremities. She was also bit by an insect that may have provoked this. She reports taking doxycycline. Today she reports significant improvement in her leg swelling bilaterally with no weeping 7/8; patient presents for a nurse visit however her wounds have closed. She has her juxta lite compressions today. She has no issues or complaints today. Electronic Signature(s) Signed: 11/20/2020 1:40:51 PM By: Kalman Shan DO Signed: 11/20/2020 1:40:51 PM By: Kalman Shan DO Entered By: Kalman Shan on 11/20/2020  13:37:20 -------------------------------------------------------------------------------- Physical Exam Details Patient Name: Date of Service: [name], Erica K.  11/20/2020 12:30 PM Medical Record Number: [id number] Patient Account Number: [id number] Date of Birth/Sex: Treating RN: 08/22/1970 (50 y.o. [name] Primary Care Provider: [name] Other Clinician: Referring Provider: Treating Provider/Extender: Judeth Porch in Treatment: 22 Constitutional respirations regular, non-labored and within target range for patient.Marland Kitchen Psychiatric pleasant and cooperative. Notes Bilateral lower extremities with venous stasis dermatitis. Good edema control with no signs of infection. No open wounds. Electronic Signature(s) Signed: 11/20/2020 1:40:51 PM By: Kalman Shan DO Entered By: Kalman Shan on 11/20/2020 13:37:51 -------------------------------------------------------------------------------- Physician Orders Details Patient Name: Date of Service: Erica Devoid K. 11/20/2020 12:30 PM Medical Record Number: [id number] Patient Account Number: [id number] Date of Birth/Sex: Treating RN: Apr 23, 1971 (50 y.o. [name] Primary Care Provider: [name] Other Clinician: Referring Provider: Treating Provider/Extender: Judeth Porch in Treatment: 22 Verbal / Phone Orders: No Diagnosis Coding ICD-10 Coding Code Description [phone] Chronic venous hypertension (idiopathic) with ulcer and inflammation of bilateral lower extremity L97.821 Non-pressure chronic ulcer of other part of left lower leg limited to breakdown of skin L97.811 Non-pressure chronic ulcer of other part of right lower leg limited to breakdown of skin L03.115 Cellulitis of right lower limb I89.0 Lymphedema, not elsewhere classified M30.0 Polyarteritis nodosa Discharge From [facility] Services Discharge from [place] Edema Control - Lymphedema /  SCD / Other Elevate legs to the level of the heart or above for 30 minutes daily and/or when sitting, a frequency of: - throughout the day Avoid standing for long periods of time. Compression stocking or Garment 30-40 mm/Hg pressure to: - Juxtalite to both legs daily Electronic Signature(s) Signed: 11/20/2020 1:40:51 PM By: Kalman Shan DO Entered By: Kalman Shan on 11/20/2020 13:38:05 -------------------------------------------------------------------------------- Problem List Details Patient Name: Date of Service: Erica Devoid K. 11/20/2020 12:30 PM Medical Record Number: [id number] Patient Account Number: [id number] Date of Birth/Sex: Treating RN: 02-02-1971 (50 y.o. [name] Primary Care Provider: [name] Other Clinician: Referring Provider: Treating Provider/Extender: Judeth Porch in Treatment: 22 Active Problems ICD-10 Encounter Code Description Active Date MDM Diagnosis I87.333 Chronic venous hypertension (idiopathic) with ulcer and inflammation of 06/16/2020 No Yes bilateral lower extremity L97.821 Non-pressure chronic ulcer of other part of left lower leg limited to breakdown 06/16/2020 No Yes of skin L97.811 Non-pressure chronic ulcer of other part of right lower leg limited to breakdown 06/16/2020 No Yes of skin L03.115 Cellulitis of right lower limb 11/06/2020 No Yes I89.0 Lymphedema, not elsewhere classified 06/16/2020 No Yes M30.0 Polyarteritis nodosa 06/16/2020 No Yes Inactive Problems Resolved Problems ICD-10 Code Description Active Date Resolved Date L03.116 Cellulitis of left lower limb 07/07/2020 07/07/2020 L03.119 Cellulitis of unspecified part of limb 10/09/2020 10/09/2020 Electronic Signature(s) Signed: 11/20/2020 1:40:51 PM By: Kalman Shan DO Entered By: Kalman Shan on 11/20/2020 13:35:59 -------------------------------------------------------------------------------- Progress Note Details Patient Name:  Date of Service: Erica Gong. 11/20/2020 12:30 PM Medical Record Number: [id number] Patient Account Number: [id number] Date of Birth/Sex: Treating RN: 05-07-1971 (50 y.o. [name] Primary Care Provider: [name] Other Clinician: Referring Provider: Treating Provider/Extender: Judeth Porch in Treatment: 22 Subjective Chief Complaint Information obtained from Patient Bilateral lower extremity wounds, left greater than right History of Present Illness (HPI) Ms. Jetter is a 50 year old female with a past medical history of psoriasis, hyperlipidemia, chronic lower extremity edema as described, cutaneous polyarteritis nodosa, sleep apnea, fibromyalgia, and osteoarthritis of both knees. She was recently followed in our clinic in February for her lower extremity  wounds due to chronic venous insufficiency and lymphedema. She was treated with compression wraps and Silver alginate And occasionally with Hydrofera Blue. These closed after a couple months of treatment. During that time she received juxta lite compressions. She was ordered lymphedema pumps to but these had not arrived prior to her wound closure. Today she presents with reopening of her lower extremity wounds. She has still not received her lymphedema pumps. She states she has been using juxta lite compressions daily. She denies acute signs of infection. 5/27; patient presents for follow-up. She was last seen 2 weeks ago. She could not make it to her last week appointment due to feeling sick. She left the wraps on for over a week and now is feeling increased pain to the lower extremities. She took them off 5 days ago and has noticed increased swelling. She has tried using her juxta light compression. 6/8; patient presents for 1 week follow-up. She finished Keflex and states that the pain in her legs has resolved and she feels much better. She has tolerated 3 layer compression with silver  alginate. She denies signs of infection. 6/15; the patient I do not usually follow-up. She has severe stage III lymphedema. She took her wraps off 3 days ago because she was soaking through. She called for an earlier appointment however we could not fit her in. She has several small areas mostly on the right lateral with weeping edema fluid. She also notes that she was bitten by an insect on the posterior leg. She now has a lot of erythema mostly in the dorsal foot and ankle. Warm and tender. I note that she only recently seems to have finished Keflex 6/30; Patient presents for follow-up. She was last seen because she was having increased swelling and weeping to her lower extremities. She was also bit by an insect that may have provoked this. She reports taking doxycycline. Today she reports significant improvement in her leg swelling bilaterally with no weeping 7/8; patient presents for a nurse visit however her wounds have closed. She has her juxta lite compressions today. She has no issues or complaints today. Patient History Information obtained from Patient. Family History Cancer - Father,Maternal Grandparents, Diabetes - Mother, Heart Disease - Father, Hypertension - Mother, Lung Disease - Mother, No family history of Hereditary Spherocytosis. Social History Never smoker, Marital Status - Single, Alcohol Use - Never, Drug Use - No History, Caffeine Use - Rarely. Medical History Hematologic/Lymphatic Patient has history of Lymphedema - lower legs Respiratory Patient has history of Sleep Apnea Cardiovascular Patient has history of Hypertension, Peripheral Venous Disease Musculoskeletal Patient has history of Osteoarthritis Psychiatric Patient has history of Confinement Anxiety Medical A Surgical History Notes nd Cardiovascular Hyperlipidemia Musculoskeletal polyarthritis nodosa, fibromyalgia Objective Constitutional respirations regular, non-labored and within target range for  patient.. Vitals Time Taken: 12:54 PM, Height: 62 in, Weight: 304 lbs, BMI: 55.6, Temperature: 97.7 F, Pulse: 74 bpm, Respiratory Rate: 17 breaths/min, Blood Pressure: 130/78 mmHg. Psychiatric pleasant and cooperative. General Notes: Bilateral lower extremities with venous stasis dermatitis. Good edema control with no signs of infection. No open wounds. Integumentary (Hair, Skin) Wound #11 status is Healed - Epithelialized. Original cause of wound was Gradually Appeared. The date acquired was: 09/08/2020. The wound has been in treatment 8 weeks. The wound is located on the Right,Posterior Lower Leg. The wound measures 0cm length x 0cm width x 0cm depth; 0cm^2 area and 0cm^3 volume. There is no tunneling or undermining noted. There is a none present  amount of drainage noted. There is no granulation within the wound bed. There is no necrotic tissue within the wound bed. Wound #12 status is Open. Original cause of wound was Gradually Appeared. The date acquired was: 09/08/2020. The wound has been in treatment 8 weeks. The wound is located on the Right,Proximal,Anterior Lower Leg. The wound measures 0cm length x 0cm width x 0cm depth; 0cm^2 area and 0cm^3 volume. Wound #13 status is Open. Original cause of wound was Gradually Appeared. The date acquired was: 09/08/2020. The wound has been in treatment 8 weeks. The wound is located on the Right,Anterior Lower Leg. The wound measures 0cm length x 0cm width x 0cm depth; 0cm^2 area and 0cm^3 volume. Assessment Active Problems ICD-10 Chronic venous hypertension (idiopathic) with ulcer and inflammation of bilateral lower extremity Non-pressure chronic ulcer of other part of left lower leg limited to breakdown of skin Non-pressure chronic ulcer of other part of right lower leg limited to breakdown of skin Cellulitis of right lower limb Lymphedema, not elsewhere classified Polyarteritis nodosa Patient has done well with compression therapy and her wounds  have closed. She has her juxta lites today and we will place them in clinic. She knows to call with any questions or concerns. She can be discharged today. Plan Discharge From [facility] Services: Discharge from [place] Edema Control - Lymphedema / SCD / Other: Elevate legs to the level of the heart or above for 30 minutes daily and/or when sitting, a frequency of: - throughout the day Avoid standing for long periods of time. Compression stocking or Garment 30-40 mm/Hg pressure to: - Juxtalite to both legs daily 1. Discharge from our clinic due to closed wounds. 2. Juxta light compressions bilaterally. Electronic Signature(s) Signed: 11/20/2020 1:40:51 PM By: Kalman Shan DO Entered By: Kalman Shan on 11/20/2020 13:38:50 -------------------------------------------------------------------------------- HxROS Details Patient Name: Date of Service: [name], Erica Kluver K. 11/20/2020 12:30 PM Medical Record Number: [id number] Patient Account Number: [id number] Date of Birth/Sex: Treating RN: [birth year] (50 y.o. [name] Primary Care Provider: [name] Other Clinician: Referring Provider: Treating Provider/Extender: Judeth Porch in Treatment: 22 Information Obtained From Patient Hematologic/Lymphatic Medical History: Positive for: Lymphedema - lower legs Respiratory Medical History: Positive for: Sleep Apnea Cardiovascular Medical History: Positive for: Hypertension; Peripheral Venous Disease Past Medical History Notes: Hyperlipidemia Musculoskeletal Medical History: Positive for: Osteoarthritis Past Medical History Notes: polyarthritis nodosa, fibromyalgia Psychiatric Medical History: Positive for: Confinement Anxiety Immunizations Pneumococcal Vaccine: Received Pneumococcal Vaccination: Yes Implantable Devices None Family and Social History Cancer: Yes - Father,Maternal Grandparents; Diabetes: Yes - Mother; Heart Disease:  Yes - Father; Hereditary Spherocytosis: No; Hypertension: Yes - Mother; Lung Disease: Yes - Mother; Never smoker; Marital Status - Single; Alcohol Use: Never; Drug Use: No History; Caffeine Use: Rarely; Financial Concerns: No; Food, Clothing or Shelter Needs: No; Support System Lacking: No; Transportation Concerns: No Electronic Signature(s) Signed: 11/20/2020 1:40:51 PM By: Kalman Shan DO Signed: 11/24/2020 5:43:47 PM By: Levan Hurst RN, BSN Entered By: Kalman Shan on 11/20/2020 13:37:27 -------------------------------------------------------------------------------- Kelly Details Patient Name: Date of Service: Erica Gong. 11/20/2020 Medical Record Number: [id number] Patient Account Number: [id number] Date of Birth/Sex: Treating RN: 11/21/1970 (50 y.o. [name] Primary Care Provider: [name] Other Clinician: Referring Provider: Treating Provider/Extender: Judeth Porch in Treatment: 22 Diagnosis Coding ICD-10 Codes Code Description [phone] Chronic venous hypertension (idiopathic) with ulcer and inflammation of bilateral lower extremity L97.821 Non-pressure chronic ulcer of other part of left lower leg limited  to breakdown of skin L97.811 Non-pressure chronic ulcer of other part of right lower leg limited to breakdown of skin L03.115 Cellulitis of right lower limb I89.0 Lymphedema, not elsewhere classified M30.0 Polyarteritis nodosa Facility Procedures CPT4 Code: [id number] Description: 62446 - WOUND CARE VISIT-LEV 3 EST PT Modifier: Quantity: 1 Physician Procedures : CPT4 Code Description Modifier [id number] - WC PHYS LEVEL 3 - EST PT ICD-10 Diagnosis Description I87.333 Chronic venous hypertension (idiopathic) with ulcer and inflammation of bilateral lower extremity L97.821 Non-pressure chronic ulcer of other  part of left lower leg limited to breakdown of skin L97.811 Non-pressure chronic ulcer of other part of  right lower leg limited to breakdown of skin I89.0 Lymphedema, not elsewhere classified Quantity: 1 Electronic Signature(s) Signed: 11/20/2020 4:56:18 PM By: Kalman Shan DO Signed: 11/24/2020 5:43:47 PM By: Levan Hurst RN, BSN Previous Signature: 11/20/2020 1:40:51 PM Version By: Kalman Shan DO Entered By: Levan Hurst on 11/20/2020 14:20:55

## 2020-11-24 NOTE — Progress Notes (Signed)
SAMAYAH, NOVINGER ([id number]) Visit Report for 11/20/2020 Arrival Information Details Patient Name: Date of Service: HA DEVORY, MCKINZIE 11/20/2020 12:30 PM Medical Record Number: [id number] Patient Account Number: [id number] Date of Birth/Sex: Treating RN: 13-Nov-1970 (50 y.o. [name], [name] Primary Care Johana Hopkinson: [name] Other Clinician: Referring Aimee Heldman: Treating Areej Tayler/Extender: Judeth Porch in Treatment: 22 Visit Information History Since Last Visit Added or deleted any medications: No Patient Arrived: Ambulatory Any new allergies or adverse reactions: No Arrival Time: 12:54 Had a fall or experienced change in No Accompanied By: self activities of daily living that may affect Transfer Assistance: None risk of falls: Patient Identification Verified: Yes Signs or symptoms of abuse/neglect since last visito No Secondary Verification Process Completed: Yes Hospitalized since last visit: No Patient Requires Transmission-Based Precautions: No Implantable device outside of the clinic excluding No Patient Has Alerts: No cellular tissue based products placed in the center since last visit: Has Dressing in Place as Prescribed: Yes Pain Present Now: No Electronic Signature(s) Signed: 11/20/2020 5:38:08 PM By: [name] RN Entered By: [name] on 11/20/2020 12:54:35 -------------------------------------------------------------------------------- Clinic Level of Care Assessment Details Patient Name: Date of Service: HA TERRILL, WAUTERS 11/20/2020 12:30 PM Medical Record Number: [id number] Patient Account Number: [id number] Date of Birth/Sex: Treating RN: 22-May-1970 (50 y.o. [name] Primary Care Bentleigh Stankus: [name] Other Clinician: Referring Shanita Kanan: Treating Manases Etchison/Extender: Judeth Porch in Treatment: 22 Clinic Level of Care Assessment Items TOOL 4 Quantity Score X- 1 0 Use when only  an EandM is performed on FOLLOW-UP visit ASSESSMENTS - Nursing Assessment / Reassessment X- 1 10 Reassessment of Co-morbidities (includes updates in patient status) X- 1 5 Reassessment of Adherence to Treatment Plan ASSESSMENTS - Wound and Skin A ssessment / Reassessment X - Simple Wound Assessment / Reassessment - one wound 1 5 []  - 0 Complex Wound Assessment / Reassessment - multiple wounds []  - 0 Dermatologic / Skin Assessment (not related to wound area) ASSESSMENTS - Focused Assessment []  - 0 Circumferential Edema Measurements - multi extremities []  - 0 Nutritional Assessment / Counseling / Intervention X- 1 5 Lower Extremity Assessment (monofilament, tuning fork, pulses) []  - 0 Peripheral Arterial Disease Assessment (using hand held doppler) ASSESSMENTS - Ostomy and/or Continence Assessment and Care []  - 0 Incontinence Assessment and Management []  - 0 Ostomy Care Assessment and Management (repouching, etc.) PROCESS - Coordination of Care X - Simple Patient / Family Education for ongoing care 1 15 []  - 0 Complex (extensive) Patient / Family Education for ongoing care X- 1 10 Staff obtains Programmer, systems, Records, T Results / Process Orders est []  - 0 Staff telephones HHA, Nursing Homes / Clarify orders / etc []  - 0 Routine Transfer to another Facility (non-emergent condition) []  - 0 Routine Hospital Admission (non-emergent condition) []  - 0 New Admissions / Biomedical engineer / Ordering NPWT Apligraf, etc. , []  - 0 Emergency Hospital Admission (emergent condition) X- 1 10 Simple Discharge Coordination []  - 0 Complex (extensive) Discharge Coordination PROCESS - Special Needs []  - 0 Pediatric / Minor Patient Management []  - 0 Isolation Patient Management []  - 0 Hearing / Language / Visual special needs []  - 0 Assessment of Community assistance (transportation, D/C planning, etc.) []  - 0 Additional assistance / Altered mentation []  - 0 Support Surface(s)  Assessment (bed, cushion, seat, etc.) INTERVENTIONS - Wound Cleansing / Measurement X - Simple Wound Cleansing - one wound 1 5 []  - 0 Complex Wound Cleansing - multiple wounds X-  1 5 Wound Imaging (photographs - any number of wounds) []  - 0 Wound Tracing (instead of photographs) X- 1 5 Simple Wound Measurement - one wound []  - 0 Complex Wound Measurement - multiple wounds INTERVENTIONS - Wound Dressings []  - 0 Small Wound Dressing one or multiple wounds []  - 0 Medium Wound Dressing one or multiple wounds []  - 0 Large Wound Dressing one or multiple wounds []  - 0 Application of Medications - topical []  - 0 Application of Medications - injection INTERVENTIONS - Miscellaneous []  - 0 External ear exam []  - 0 Specimen Collection (cultures, biopsies, blood, body fluids, etc.) []  - 0 Specimen(s) / Culture(s) sent or taken to Lab for analysis []  - 0 Patient Transfer (multiple staff / Civil Service fast streamer / Similar devices) []  - 0 Simple Staple / Suture removal (25 or less) []  - 0 Complex Staple / Suture removal (26 or more) []  - 0 Hypo / Hyperglycemic Management (close monitor of Blood Glucose) []  - 0 Ankle / Brachial Index (ABI) - do not check if billed separately X- 1 5 Vital Signs Has the patient been seen at the hospital within the last three years: Yes Total Score: 80 Level Of Care: New/Established - Level 3 Electronic Signature(s) Signed: 11/24/2020 5:43:47 PM By: Levan Hurst RN, BSN Entered By: Levan Hurst on 11/20/2020 14:20:47 -------------------------------------------------------------------------------- Encounter Discharge Information Details Patient Name: Date of Service: [name] 11/20/2020 12:30 PM Medical Record Number: [id number] Patient Account Number: [id number] Date of Birth/Sex: Treating RN: December 26, 1970 (50 y.o. [name], [name] Primary Care Raffaella Edison: [name] Other Clinician: Referring Nahum Sherrer: Treating Kayler Buckholtz/Extender: Judeth Porch in Treatment: 22 Encounter Discharge Information Items Discharge Condition: Stable Ambulatory Status: Ambulatory Discharge Destination: Home Transportation: Private Auto Accompanied By: self Schedule Follow-up Appointment: Yes Clinical Summary of Care: Patient Declined Electronic Signature(s) Signed: 11/20/2020 5:38:08 PM By: [name] RN Entered By: [name] on 11/20/2020 13:46:10 -------------------------------------------------------------------------------- Lower Extremity Assessment Details Patient Name: Date of Service: [name]. 11/20/2020 12:30 PM Medical Record Number: [id number] Patient Account Number: [id number] Date of Birth/Sex: Treating RN: 07-22-1970 (50 y.o. [name] Primary Care Deserai Cansler: [name] Other Clinician: Referring Clayten Allcock: Treating Alexanderia Gorby/Extender: Judeth Porch in Treatment: 22 Edema Assessment Assessed: Shirlyn Goltz: No] Patrice Paradise: No] Edema: [Left: Yes] [Right: Yes] Calf Left: Right: Point of Measurement: 28 cm From Medial Instep 51 cm 50 cm Ankle Left: Right: Point of Measurement: 11 cm From Medial Instep 29 cm 30 cm Vascular Assessment Pulses: Dorsalis Pedis Palpable: [Left:Yes] [Right:Yes] Electronic Signature(s) Signed: 11/24/2020 5:43:47 PM By: Levan Hurst RN, BSN Entered By: Levan Hurst on 11/20/2020 13:03:15 -------------------------------------------------------------------------------- Multi Wound Chart Details Patient Name: Date of Service: [name] 11/20/2020 12:30 PM Medical Record Number: [id number] Patient Account Number: [id number] Date of Birth/Sex: Treating RN: Sep 26, 1970 (50 y.o. [name] Primary Care Yakelin Grenier: [name] Other Clinician: Referring Dereck Agerton: Treating Eyvonne Burchfield/Extender: Judeth Porch in Treatment: 22 Vital Signs Height(in): 62 Pulse(bpm): 53 Weight(lbs):  304 Blood Pressure(mmHg): 130/78 Body Mass Index(BMI): 56 Temperature(F): 97.7 Respiratory Rate(breaths/min): 17 Photos: [11:No Photos Right, Posterior Lower Leg] [12:No Photos Right, Proximal, Anterior Lower Leg] [13:No Photos Right, Anterior Lower Leg] Wound Location: [11:Gradually Appeared] [12:Gradually Appeared] [13:Gradually Appeared] Wounding Event: [11:Lymphedema] [12:Lymphedema] [13:Lymphedema] Primary Etiology: [11:Lymphedema, Sleep Apnea,] [12:N/A] [13:N/A] Comorbid History: [11:Hypertension, Peripheral Venous Disease, Osteoarthritis, Confinement Anxiety 09/08/2020] [12:09/08/2020] [13:09/08/2020] Date Acquired: [11:8] [12:8] [13:8] Weeks of Treatment: [11:Healed - Epithelialized] [12:Open] [13:Open] Wound Status: [11:0x0x0] [12:0x0x0] [13:0x0x0] Measurements  L x W x D (cm) [11:0] [12:0] [13:0] A (cm) : rea [11:0] [12:0] [13:0] Volume (cm) : [11:100.00%] [12:100.00%] [13:100.00%] % Reduction in Area: [11:100.00%] [12:100.00%] [13:100.00%] % Reduction in Volume: [11:Full Thickness Without Exposed] [12:Full Thickness Without Exposed] [13:Full Thickness Without Exposed] Classification: [11:Support Structures None Present] [12:Support Structures N/A] [13:Support Structures N/A] Exudate Amount: [11:None Present (0%)] [12:N/A] [13:N/A] Granulation Amount: [11:None Present (0%)] [12:N/A] [13:N/A] Necrotic Amount: [11:Fascia: No] [12:N/A] [13:N/A] Exposed Structures: [11:Fat Layer (Subcutaneous Tissue): No Tendon: No Muscle: No Joint: No Bone: No Large (67-100%)] [12:N/A] [13:N/A] Treatment Notes Electronic Signature(s) Signed: 11/20/2020 1:40:51 PM By: Kalman Shan DO Signed: 11/24/2020 5:43:47 PM By: Levan Hurst RN, BSN Entered By: Kalman Shan on 11/20/2020 13:36:41 -------------------------------------------------------------------------------- Multi-Disciplinary Care Plan Details Patient Name: Date of Service: [name] 11/20/2020 12:30 PM Medical Record  Number: [id number] Patient Account Number: [id number] Date of Birth/Sex: Treating RN: June 22, 1970 (50 y.o. [name] Primary Care Aristide Waggle: [name] Other Clinician: Referring Teresea Donley: Treating Apphia Cropley/Extender: Judeth Porch in Treatment: 22 Active Inactive Electronic Signature(s) Signed: 11/24/2020 5:43:47 PM By: Levan Hurst RN, BSN Entered By: Levan Hurst on 11/20/2020 14:19:50 -------------------------------------------------------------------------------- Pain Assessment Details Patient Name: Date of Service: [name] 11/20/2020 12:30 PM Medical Record Number: [id number] Patient Account Number: [id number] Date of Birth/Sex: Treating RN: [birth year] (50 y.o. [name] Primary Care Tyronn Golda: [name] Other Clinician: Referring Alger Kerstein: Treating Markeshia Giebel/Extender: Judeth Porch in Treatment: 22 Active Problems Location of Pain Severity and Description of Pain Patient Has Paino No Site Locations Pain Management and Medication Current Pain Management: Electronic Signature(s) Signed: 11/24/2020 5:43:47 PM By: Levan Hurst RN, BSN Entered By: Levan Hurst on 11/20/2020 13:03:08 -------------------------------------------------------------------------------- Patient/Caregiver Education Details Patient Name: Date of Service: [name] 7/8/2022andnbsp12:30 PM Medical Record Number: [id number] Patient Account Number: [id number] Date of Birth/Gender: Treating RN: 24-Feb-1971 (50 y.o. [name] Primary Care Physician: [name] Other Clinician: Referring Physician: Treating Physician/Extender: Judeth Porch in Treatment: 22 Education Assessment Education Provided To: Patient Education Topics Provided Wound/Skin Impairment: Methods: Explain/Verbal Responses: State content correctly [employer]) Signed: 11/24/2020  5:43:47 PM By: Levan Hurst RN, BSN Entered By: Levan Hurst on 11/20/2020 14:20:04 -------------------------------------------------------------------------------- Wound Assessment Details Patient Name: Date of Service: [name] 11/20/2020 12:30 PM Medical Record Number: [id number] Patient Account Number: [id number] Date of Birth/Sex: Treating RN: February 21, 1971 (50 y.o. [name] Primary Care Lauraann Missey: [name] Other Clinician: Referring Eliyah Bazzi: Treating Jahmarion Popoff/Extender: Judeth Porch in Treatment: 22 Wound Status Wound Number: 11 Primary Lymphedema Etiology: Wound Location: Right, Posterior Lower Leg Wound Healed - Epithelialized Wounding Event: Gradually Appeared Status: Date Acquired: 09/08/2020 Comorbid Lymphedema, Sleep Apnea, Hypertension, Peripheral Venous Weeks Of Treatment: 8 History: Disease, Osteoarthritis, Confinement Anxiety Clustered Wound: No Wound Measurements Length: (cm) Width: (cm) Depth: (cm) Area: (cm) Volume: (cm) 0 % Reduction in Area: 100% 0 % Reduction in Volume: 100% 0 Epithelialization: Large (67-100%) 0 Tunneling: No 0 Undermining: No Wound Description Classification: Full Thickness Without Exposed Support Structures Exudate Amount: None Present Foul Odor After Cleansing: No Slough/Fibrino No Wound Bed Granulation Amount: None Present (0%) Exposed Structure Necrotic Amount: None Present (0%) Fascia Exposed: No Fat Layer (Subcutaneous Tissue) Exposed: No Tendon Exposed: No Muscle Exposed: No Joint Exposed: No Bone Exposed: No Electronic Signature(s) Signed: 11/24/2020 5:43:47 PM By: Levan Hurst RN, BSN Entered By: Levan Hurst on 11/20/2020 13:03:48 -------------------------------------------------------------------------------- Wound Assessment Details Patient Name: Date of Service: HA [name],  Alyza K. 11/20/2020 12:30 PM Medical Record Number: [id number] Patient Account  Number: [id number] Date of Birth/Sex: Treating RN: [birth year] (50 y.o. [name] Primary Care Baley Lorimer: [name] Other Clinician: Referring Amilyah Nack: Treating Kamica Florance/Extender: Judeth Porch in Treatment: 22 Wound Status Wound Number: 12 Primary Etiology: Lymphedema Wound Location: Right, Proximal, Anterior Lower Leg Wound Status: Open Wounding Event: Gradually Appeared Date Acquired: 09/08/2020 Weeks Of Treatment: 8 Clustered Wound: No Wound Measurements Length: (cm) Width: (cm) Depth: (cm) Area: (cm) Volume: (cm) 0 % Reduction in Area: 100% 0 % Reduction in Volume: 100% 0 0 0 Wound Description Classification: Full Thickness Without Exposed Support Structur es Electronic Signature(s) Signed: 11/24/2020 5:43:47 PM By: Levan Hurst RN, BSN Entered By: Levan Hurst on 11/20/2020 13:03:32 -------------------------------------------------------------------------------- Wound Assessment Details Patient Name: Date of Service: [name] 11/20/2020 12:30 PM Medical Record Number: [id number] Patient Account Number: [id number] Date of Birth/Sex: Treating RN: [birth year] (50 y.o. [name] Primary Care Coleson Kant: [name] Other Clinician: Referring Dierra Riesgo: Treating Kawena Lyday/Extender: Judeth Porch in Treatment: 22 Wound Status Wound Number: 13 Primary Etiology: Lymphedema Wound Location: Right, Anterior Lower Leg Wound Status: Open Wounding Event: Gradually Appeared Date Acquired: 09/08/2020 Weeks Of Treatment: 8 Clustered Wound: No Wound Measurements Length: (cm) Width: (cm) Depth: (cm) Area: (cm) Volume: (cm) 0 % Reduction in Area: 100% 0 % Reduction in Volume: 100% 0 0 0 Wound Description Classification: Full Thickness Without Exposed Support Structur es Electronic Signature(s) Signed: 11/24/2020 5:43:47 PM By: Levan Hurst RN, BSN Entered By: Levan Hurst on 11/20/2020 13:03:32 -------------------------------------------------------------------------------- Vitals Details Patient Name: Date of Service: [name] 11/20/2020 12:30 PM Medical Record Number: [id number] Patient Account Number: [id number] Date of Birth/Sex: Treating RN: Aug 03, 1970 (50 y.o. [name], [name] Primary Care Deloma Spindle: [name] Other Clinician: Referring Blasa Raisch: Treating Mailyn Steichen/Extender: Judeth Porch in Treatment: 22 Vital Signs Time Taken: 12:54 Temperature (F): 97.7 Height (in): 62 Pulse (bpm): 74 Weight (lbs): 304 Respiratory Rate (breaths/min): 17 Body Mass Index (BMI): 55.6 Blood Pressure (mmHg): 130/78 Reference Range: 80 - 120 mg / dl Electronic Signature(s) Signed: 11/20/2020 5:38:08 PM By: [name] RN Entered By: [name] on 11/20/2020 12:56:24

## 2020-12-02 ENCOUNTER — Telehealth (INDEPENDENT_AMBULATORY_CARE_PROVIDER_SITE_OTHER): Payer: Medicare Other | Admitting: Family Medicine

## 2020-12-02 ENCOUNTER — Encounter: Payer: Self-pay | Admitting: Family Medicine

## 2020-12-02 ENCOUNTER — Other Ambulatory Visit: Payer: Self-pay

## 2020-12-02 ENCOUNTER — Telehealth: Payer: Medicare Other | Admitting: Family Medicine

## 2020-12-02 VITALS — Temp 98.8°F

## 2020-12-02 DIAGNOSIS — J3489 Other specified disorders of nose and nasal sinuses: Secondary | ICD-10-CM | POA: Diagnosis not present

## 2020-12-02 DIAGNOSIS — J019 Acute sinusitis, unspecified: Secondary | ICD-10-CM | POA: Diagnosis not present

## 2020-12-02 MED ORDER — AMOXICILLIN 875 MG PO TABS: 875.0000 mg | ORAL_TABLET | Freq: Two times a day (BID) | ORAL | 0 refills | Status: AC

## 2020-12-02 NOTE — Progress Notes (Addendum)
Virtual Visit via Video Note  I connected with [name] on 12/02/20 at 4:31 PM by a video enabled telemedicine application and verified that I am speaking with the correct person using two identifiers.  Patient location: home.  My location: office - [place].    I discussed the limitations, risks, security and privacy concerns of performing an evaluation and management service by telephone and the availability of in person appointments. I also discussed with the patient that there may be a patient responsible charge related to this service. The patient expressed understanding and agreed to proceed, consent obtained  Chief complaint:  Chief Complaint  Patient presents with   Facial Pain    Pt reports pain in her sinus pressure and headache, pt reports started 3-4 weeks ago, pt denies chest congestion, has some cough, benadryl helps but persistent      History of Present Illness: [name] is a 50 y.o. female  Sinus pain: Sinus pain, headache. No sick contacts. Started about 5 days ago, sinus pressure around nose, into cheeks, into upper teeth. Purulent nasal d/c past 3 days.  Tx: benadryl - usually helps with benadryl, not better this time. Uses vicks decongestant nasal spray - only during acute sinus symptoms - using past 3 days (aware of limited time of use and rebound congestion).  No fever.  Some runny nose and slight cough, phlegm feeling in throat.  Negative covid test 3 days ago - has not repeated (will tonight, will call later to discuss result).  Feels similar to prior sinus infections.   Tripped yesterday with dog. Bruised forehead on left after hitting floor, sinus headache only - no new HA, no LOC. Denies home safety concerns. No other falls recently. No vision changes.   Patient Active Problem List   Diagnosis Date Noted   Hyponatremia 07/27/2019   Urinary urgency 04/04/2018   Chronic narcotic use 09/07/2016   Acute blood loss anemia 01/14/2016   Multiple  gastric ulcers    PAN (polyarteritis nodosa) ([place]) 11/24/2015   GERD (gastroesophageal reflux disease) 01/08/2014   Routine general medical examination at a health care facility 04/23/2013   Bariatric surgery status 10/31/2012   S/P skin biopsy 10/31/2012   Psoriasis 01/09/2012   DDD (degenerative disc disease), lumbar 11/30/2011   Migraine headache    [name] (obstructive sleep apnea) 11/16/2010   HYPERGLYCEMIA, FASTING 10/08/2009   CONTRACTURE OF TENDON 08/17/2009   PARESTHESIA, HANDS 12/23/2008   Leg pain, right 11/05/2008   ADVERSE DRUG REACTION 04/03/2008   PERIPHERAL EDEMA 03/26/2008   Morbid obesity ([place]) 01/03/2007   Cellulitis 01/03/2007   Hyperlipidemia 09/22/2006   Fibromyalgia 09/20/2006   Anxiety and depression 06/28/2006   Past Medical History:  Diagnosis Date   Allergic rhinitis    Ankle fracture, right    Anxiety    ASCUS of cervix with negative high risk HPV 05/2018   Carpal tunnel syndrome    Cervical cancer ([place]) 1998   Fibromyalgia    Hyperlipidemia    Hypertension    Migraine headache    Obesity    Sleep apnea    Past Surgical History:  Procedure Laterality Date   ABDOMINAL HYSTERECTOMY  10/1996   ABDOMINAL SURGERY  jan/11/2012   gastric bypass surgery   CARPAL TUNNEL RELEASE     bilateral    CESAREAN SECTION     CHOLECYSTECTOMY  07/1992   ESOPHAGOGASTRODUODENOSCOPY N/A 01/14/2016   Procedure: ESOPHAGOGASTRODUODENOSCOPY (EGD);  Surgeon: Mauri Pole, MD;  Location: [facility];  Service: Endoscopy;  Laterality: N/A;   Allergies  Allergen Reactions   Niacin Anaphylaxis, Swelling and Other (See Comments)    REACTION: Swelling, problems breathing   Sulfamethoxazole-Trimethoprim Anaphylaxis, Swelling, Rash and Other (See Comments)    REACTION: Rash, throat closed, eyes swelled.   Aspirin Hives   Bactrim Hives   Benzoin Compound Rash   Cephalexin Hives    Tolerated ceftriaxone and cefepime 07/2019   Clindamycin/Lincomycin Hives   Doxycycline  Hives    Taken 10/19/2020 no reaction    Iohexol Hives    Pt treated with PO benedryl   Lisinopril Hives   Naproxen Hives   Pnu-Imune [Pneumococcal Polysaccharide Vaccine] Rash   Sulfonamide Derivatives Rash   Prior to Admission medications   Medication Sig Start Date End Date Taking? Authorizing Provider  ALPRAZolam Duanne Moron) 0.5 MG tablet TAKE 1 TABLET BY MOUTH 2 TIMES DAILY AS NEEDED FOR ANXIETY 11/12/20  Yes [name], MD  ARIPiprazole (ABILIFY) 10 MG tablet Take 10 mg by mouth in the morning.    Yes [provider]  Armodafinil 250 MG tablet 3 (three) times daily. 8am, 2pm, 6pm 11/12/19  Yes [provider]  Biotin 10 MG TABS Take 10 mg by mouth in the morning.  07/25/18  Yes [provider]  buPROPion (WELLBUTRIN XL) 300 MG 24 hr tablet TAKE 1 TABLET BY MOUTH EVERY DAY 11/18/20  Yes [name], MD  busPIRone (BUSPAR) 30 MG tablet TAKE 1 TABLET BY MOUTH 2 TIMES DAILY 08/31/20  Yes [name], MD  calcium citrate (CALCITRATE - DOSED IN MG ELEMENTAL CALCIUM) 950 MG tablet Take 1 tablet by mouth daily.   Yes [provider]  clobetasol cream (TEMOVATE) 6.38 % Apply 1 application topically 2 (two) times daily. Cover both legs with Colbetasol cream 08/02/19  Yes Florencia Reasons, MD  clonazePAM (KLONOPIN) 1 MG tablet TAKE 1 TABLET BY MOUTH AT BEDTIME 09/29/20  Yes [name], MD  collagenase (SANTYL) ointment Apply topically daily. apply Santyl to scabs on legs 08/03/19  Yes Florencia Reasons, MD  cyclobenzaprine (FLEXERIL) 10 MG tablet TAKE 1 TABLET BY MOUTH 3 TIMES DAILY AS NEEDED 09/24/20  Yes [name], MD  DULoxetine (CYMBALTA) 60 MG capsule Take 2 capsules (120 mg total) by mouth daily. 06/22/15  Yes [name], MD  FEROSUL 325 (65 Fe) MG tablet TAKE 1 TABLET BY MOUTH EVERY DAY WITH BREAKFAST 11/18/20  Yes [name], MD  fluconazole (DIFLUCAN) 100 MG tablet Take 1 tablet (100 mg total) by mouth daily. 07/10/20  Yes  [name], MD  furosemide (LASIX) 40 MG tablet TAKE 1 TABLET BY MOUTH ONCE DAILY. MAY take additional TABLET IF weight increases by 5lb AND call MD for further instructions 11/18/20  Yes [name], MD  gabapentin (NEURONTIN) 600 MG tablet Take 1 tablet (600 mg total) by mouth 3 (three) times daily. TAKE 2 TABLETS BY MOUTH 3 TIMES DAILY 07/09/20  Yes [name], MD  HYDROcodone-acetaminophen (NORCO) 10-325 MG tablet TAKE 1 TABLET BY MOUTH EVERY 6 HOURS AS NEEDED 11/12/20  Yes [name], MD  irbesartan (AVAPRO) 75 MG tablet TAKE 1 TABLET BY MOUTH EVERY DAY 12/23/19  Yes [name], MD  meclizine (ANTIVERT) 25 MG tablet Take 1 tablet (25 mg total) by mouth 3 (three) times daily as needed for dizziness. 07/18/16  Yes [name], MD  mupirocin ointment (BACTROBAN) 2 % APPLY TOPICALLY 2 TIMES DAILY 04/20/20  Yes  [name], MD  nebivolol (BYSTOLIC) 10 MG tablet TAKE 1 TABLET BY MOUTH EVERY DAY 11/18/20  Yes [name], MD  nystatin (MYCOSTATIN/NYSTOP) powder Apply 1 application topically 3 (three) times daily. 03/17/20  Yes [name], MD  Oxcarbazepine (TRILEPTAL) 300 MG tablet Take 300 mg by mouth 2 (two) times daily.   Yes [provider]  pantoprazole (PROTONIX) 40 MG tablet TAKE 1 TABLET BY MOUTH 2 TIMES DAILY 11/18/20  Yes [name], MD  pentoxifylline (TRENTAL) 400 MG CR tablet Take 400 mg by mouth 3 (three) times daily with meals.  03/22/17  Yes [provider]  phentermine 37.5 MG capsule Take 1 capsule (37.5 mg total) by mouth every morning. 08/07/20  Yes [name], MD  promethazine (PHENERGAN) 25 MG tablet TAKE 1 TABLET BY MOUTH EVERY 6 HOURS AS NEEDED FOR NAUSEA AND VOMITING 09/07/20  Yes [name], MD  rosuvastatin (CRESTOR) 20 MG tablet TAKE 1 TABLET BY MOUTH EVERY DAY 08/18/20  Yes [name], MD  senna-docusate (SENOKOT-S) 8.6-50 MG tablet Take 1 tablet by mouth at  bedtime. 08/02/19  Yes Florencia Reasons, MD  SKYRIZI, 150 MG DOSE, 75 MG/0.83ML PSKT Inject 150 mg into the skin every 3 (three) months.  04/07/19  Yes [provider]  SUMAtriptan (IMITREX) 100 MG tablet Take 1 tablet (100 mg total) by mouth every 2 (two) hours as needed for migraine. May repeat in 2 hours if headache persists or recurs. 03/11/20  Yes [name], MD  ciprofloxacin (CIPRO) 500 MG tablet Take 1 tablet (500 mg total) by mouth 2 (two) times daily. 10/05/20   [name], MD   Social History   Socioeconomic History   Marital status: Single    Spouse name: Not on file   Number of children: Not on file   Years of education: Not on file   Highest education level: Not on file  Occupational History   Occupation: disabled. 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)  Tobacco Use   Smoking status: Never   Smokeless tobacco: Never  Vaping Use   Vaping Use: Never used  Substance and Sexual Activity   Alcohol use: No   Drug use: No   Sexual activity: Yes    Comment: TAH-1st intercourse 54 yo-5 partners  Other Topics Concern   Not on file  Social History Narrative   Not on file   Social Determinants of Health   Financial Resource Strain: Low Risk    Difficulty of Paying Living Expenses: Not hard at all  Food Insecurity: No Food Insecurity   Worried About Charity fundraiser in the Last Year: Never true   [place] in the Last Year: Never true  Transportation Needs: No Transportation Needs   Lack of Transportation (Medical): No   Lack of Transportation (Non-Medical): No  Physical Activity: Inactive   Days of Exercise per Week: 0 days   Minutes of Exercise per Session: 0 min  Stress: No Stress Concern Present   Feeling of Stress : Only a little  Social Connections: Moderately Isolated   Frequency of Communication with Friends and Family: More than three times a week   Frequency of Social Gatherings with Friends and Family: More than three times a week   Attends  Religious Services: More than 4 times per year   Active Member of [employer] or Organizations: No   Attends Archivist Meetings: Never   Marital Status: Never married  Intimate Partner Violence: Not At Risk   Fear of Current or Ex-Partner: No   Emotionally Abused: No   Physically Abused: No   Sexually Abused: No    Observations/Objective: Vitals:   12/02/20 1455  Temp: 98.8 F (37.1 C)  Nontoxic appearance, speaking in full sentences without respiratory distress.  Bruising noted over the left frontal forehead.  No active bleeding noted.  Appropriate and coherent responses.  All questions were answered with understanding of plan expressed   Assessment and Plan: Sinus pain - Plan: amoxicillin (AMOXIL) 875 MG tablet  Acute sinusitis, recurrence not specified, unspecified location - Plan: amoxicillin (AMOXIL) 875 MG tablet -Possible early/acute sinusitis.  Discussed initial treatment with saline nasal spray, fluids, short-term decongestant only.  Will need to stop Vicks after tonight.  If not improving, amoxicillin sent to her pharmacy.on chart review with 09/25/19 note with PCP - pt has taken amox previously w/o difficulty despite hives w/ Keflex.  Symptomatic care discussed with urgent care/ER precautions. -Initial negative COVID testing 3 days ago, but symptoms had only started 1 day prior.  Plan for repeat testing at home tonight, and I will call her later to determine result and antiviralif needed as she would be 5 days into symptoms at this time.  5:40 PM Addendum - called pt, repeat covid test negative - plan as above.   Follow Up Instructions: As above.    I discussed the assessment and treatment plan with the patient. The patient was provided an opportunity to ask questions and all were answered. The patient agreed with the plan and demonstrated an understanding of the instructions.   The patient was advised to call back or seek an in-person evaluation if the symptoms  worsen or if the condition fails to improve as anticipated.  I provided 21 minutes of non-face-to-face time during this encounter.   Wendie Agreste, MD

## 2020-12-03 ENCOUNTER — Telehealth: Payer: Medicare Other | Admitting: Family Medicine

## 2020-12-04 ENCOUNTER — Other Ambulatory Visit: Payer: Self-pay | Admitting: Family Medicine

## 2020-12-04 NOTE — Telephone Encounter (Signed)
LAST APPOINTMENT DATE: 12/02/2020   NEXT APPOINTMENT DATE: 03/04/2021    LAST REFILL: 11/12/2020  QTY:90

## 2020-12-04 NOTE — Telephone Encounter (Signed)
Patient is requesting a refill of the following medications: Requested Prescriptions   Pending Prescriptions Disp Refills   HYDROcodone-acetaminophen ([place]) 10-325 MG tablet [Pharmacy Med Name: hydrocodone 10 mg-acetaminophen 325 mg tablet] 90 tablet 0    Sig: TAKE 1 TABLET BY MOUTH EVERY 6 HOURS AS NEEDED    Date of patient request: 12/04/20 [name] pt  Last office visit: 10/05/20 Date of last refill: 11/12/20 Last refill amount: 90

## 2020-12-15 ENCOUNTER — Encounter: Admit: 2020-12-15 | Discharge: 2020-12-17 | Payer: MEDICARE

## 2020-12-15 DIAGNOSIS — M79605 Pain in left leg: Principal | ICD-10-CM

## 2020-12-15 DIAGNOSIS — L409 Psoriasis, unspecified: Principal | ICD-10-CM

## 2020-12-15 DIAGNOSIS — I890 Lymphedema, not elsewhere classified: Principal | ICD-10-CM

## 2020-12-15 DIAGNOSIS — M79604 Pain in right leg: Principal | ICD-10-CM

## 2020-12-15 DIAGNOSIS — R2689 Other abnormalities of gait and mobility: Principal | ICD-10-CM

## 2020-12-15 DIAGNOSIS — L52 Erythema nodosum: Principal | ICD-10-CM

## 2020-12-16 NOTE — Unmapped (Signed)
[facility]  OUTPATIENT OCCUPATIONAL THERAPY  12/15/2020  Note Type: Re-evaluation/Progress Note  Note Date Range: 10/19/20 through 12/15/20    Patient Name: Autumn Mcdonald  Date of Birth:21-May-1970  Diagnosis:   Encounter Diagnosis   Name Primary?   ??? Lymphedema Yes     Visit 2    Referring MD:  Ethlyn Gallery     Date OT Care Plan Established or Reviewed-10/19/20    Plan of Care Effective Date: 12/15/2020 - 01/15/2021    Assessment/Plan:    Assessment details:       Pt is a 50 year old female with a medical and treatment dx of lymphedema.. Pt has previously had wounds on BLE that have now healed.  Pt has only been seen for initial evaluation and reevaluation/treatment due to period between the two where her legs were not in an appropriate state for treatment and pt would not return to therapy until the weeping had ceased as she was being wrapped by an outside wound center. Due to this she has not progressed in her goals so current goals will remain in place. Pt now Is at a state where she is appropriate for treatment and needs significant lifestyle changes for optimal health. Therapist has discussed this with pt and she is agreeable to start with physical therapy to start addressing LB strength, mobility, gait and balance. Pt presents with body system impairments including edema, skin integrity that impair her general health, ambulation, ADL, and IADL functions. She would benefit from continued outpatient occupational therapy to address lymphedema in order to reach highest level of function.   Impairments: fall risk, increased edema, uncontrolled swelling, impaired ADLs, decreased skin integrity and decreased mobility  ??  Pt also demonstrates a need for physical therapy due to high risk of falls, poor balance and decreased mobility.   Impairments: fall risk, impaired balance, increased edema, pain, decreased endurance, decreased mobility, impaired ADLs and decreased skin integrity    Personal Factors/Comorbidities: 3    Examination of Body Systems: 3 elements    Clinical Presentation: evolving  Clinical Decision Making: moderate  Prognosis: good prognosis  Positive Prognosis Rationale: age and motivated for treatment.  Negative Prognosis Rationale: insight, chronicity of condition, severity of symptoms, body habitus and balance.    Therapy Goals  Goals:       Goals:      Short Term Goals:  1. Pt's circumference of BLE will be reduced by 1cm at all measurements within  2 weeks at feet and knees.   CURRENT STATUS: regression. Continue goal for progression.    2. Pt's skin integrity of BLE will improve at ankles in 2 weeks to decrease  potential for infection.  CURRENT STATUS: improved. Continue for mastery.     3. Pt will be educated on self MLD and recall it with 100% accuracy to perform  in the home in 2 weeks.  CURRENT STATUS: Pt has been given information. She cannot yet recall it. Continue for mastery.  4. Pt's skin texture of BLE will improve to a soft, supple state within 2  Weeks.  CURRENT STATUS: No improvement. Continue for mastery.    Long Term Goals:  1. Pt's circumference of bilateral lower extremities will be reduced by 2cm at  all appropriate measurements within 4 weeks to increase ADL function.  CURRENT STATUS: Regression. Continue for mastery.  2. Pt will recall all precautions to prevent lymphedema exacerbation at 100% in  4 weeks.  CURRENT STATUS: Pt  has been given information. She is not yet independent in her recall. Continue goal for mastery.   3. Pt will be fitted for compression garments to address BLE edema with  independent donning in 4 weeks.  CURRENT STATUS: Pt has not yet been fitted for garments. She will need them for her upper BLE.   4. Pt will be independent with home management of lymphedema in 4 weeks. Continue for mastery.   CURRENT STATUS: Pt does not yet have the tools for independent management of lymphedema. Continue for mastery.   Plan  Therapy options: will be seen for skilled occupational therapy services  Planned therapy interventions: Compression Bandaging, Compression Pump, Education - Patient, Home Exercise Program, Garment Measurement, Therapeutic Exercises, Therapeutic Activities, Manual Therapy and Manual Lymph Drainage  DME Equipment: compression pump, lymphedema bandages and compression garments.  Frequency: 2x week  Duration in weeks: 4 weeks  Education provided to: patient.  Education provided: Importance of Therapy, HEP, Lymphedema precautions and Manual lymph drainage  Education results: needs reinforcement and needs further instruction.      Total Session Time: 55  Treatment rendered today:       Pt was reevaluated for progress.   Pt then lay in supine with HOB elevated for comfort. Therapist completed manual lymph drainage pre-treatment to L axilla and then full MLD to LLE. Next session we will treat BLE. No compression placed on this date.         Subjective:   History of Present Illness  Date of Onset: 05/17/2003    Date of Evaluation: 10/19/2020    Reason for Referral/Chief Complaint:       Pt reports that her legs have been swelling since 2005. She was sent to rumatologist in 2014/2015 with an appropriate dx then and it has progressively gotten worse. She has also been getting treatment for psoriasis. Pt has been seen at wound center in [place] for the past 17 weeks. She has not been released by them yet. Pt desires to wear appropriate shoes and to have a better life. She is seeking outpatient lymphedema treatment to address BLE lymphedema.   Subjective:     Pt reports that she recently had a fall at home. This is evidenced by bruising on her face (L side) that is resolving. Pt reports that her BLE are no longer weeping and that she has been DC'd from [facility]. She was discharged approximately 3 weeks ago and now feels like she is ready to start lymphedema therapy. Pt also feels like she would benefit from physical therapy to address mobility. Occupational therapist is in agreement with this as it was in the initial plan. Therapist will be reaching out to referring physician or PCP for orders.   Quality of life: fair    Pain  Current pain rating: 5  At best pain rating: 2  At worst pain rating: 10  Location: BLE  Quality: dull ache, heavy, aching and intermittent  Relieving factors: elevation  Aggravating factors: walking  Progression: worsening  Red flags: night pain and disturbed sleep.  Current functional status: limited walking tolerance, unsteady gait, disturbed sleep, leisure activities, limited standing tolerance, use of assistive device, limited recreation, limited household activities and limited exercise    Precautions and Equipment  Precautions: Fall risk and Cancer history  Equipment Currently Used: Metallurgist, Training and development officer  Social Support  Lives in: two story house  Lives with: family members  Hand dominance: right  Communication Preference:  verbal  Barriers to Learning: No Barriers  Work/School: on disability    Diagnostic Tests    Diagnostic Test Comments:       none    Treatments  No previous or current treatments      Patient Goals  Patient goals for therapy: decrease assistance with ADLs, decreased edema, decreased pain, improve fit of clothing, improve fit of shoes, improve independence with dressing, improved ambulation, improved sleep, independence with ADLs/IADLs, obtain compression garment and return to sport/leisure activities  Patient goal: obatin pneumatic pump      Objective:   Lymphedema:   Treatment:     Lymph node removal: No      Chemotherapy: No      Radiation therapy: No      Previous lymphedema treatment: No      Reoccurrence?: No    Current Management:     Daytime:  Nothing    Nighttime:  Nothing  Contraindications:     Contraindications:  None  Objective:     Location of swelling:  Right, left, ankle, toes, knee, thigh, groin and foot    Fold thickness:  Moderate    Fibrosis: No      Skin assessment:  Discoloration, edema and dry skin    Skin assessment comments:  Skin is noted to be lighter pink. No evidence of wounds on this date.    Incision:  N/A    Color:  Pink    Temperature:  Normal    Hair growth:  Reduced    Stemmer's sign:  +    Wound: No    Posture/Observations:     Sitting:  Rounded shoulders    Standing:  Forward flexed posture and wide BOS    Sleeping:  Recliner  ROM and Strength:     ROM and Strength findings:  BLE WFL  Tests:     Lower Extremity Functional Scale Score (out of 80):  2  Lymphedema additional information:  Measurements:    RLE:                    12/15/20:  MTP: 24.5cm        25cm  Heel: 37cm           36cm  Ankle: 31cm         33.1cm  10cm: 33cm         43.8cm   20cm: 49cm         57.5cm  Knee: 56.3cm       59.5cm  10cm: 84.2cm??      87cm   20cm: 92.8cm       97.5cm    LLE:  MTP: 25.6cm        24.5cm  Heel: 37.2cm        35.8cm  Ankle: 34cm          33cm  10cm: 36.3cm       41.5cm  20cm: 54.5cm       58.5cm  Knee: 59cm           73cm  10cm: 90.2cm       96cm  20cm: 100cm        99.9cm                               I attest that I have reviewed the above information.  Signed: Melvern Banker, OT  12/15/2020 3:35 PM

## 2020-12-21 ENCOUNTER — Ambulatory Visit: Admit: 2020-12-21 | Payer: MEDICARE

## 2020-12-21 ENCOUNTER — Ambulatory Visit: Admit: 2020-12-21 | Discharge: 2021-01-16 | Payer: MEDICARE

## 2020-12-21 ENCOUNTER — Encounter: Admit: 2020-12-21 | Payer: MEDICARE

## 2020-12-21 DIAGNOSIS — I890 Lymphedema, not elsewhere classified: Principal | ICD-10-CM

## 2020-12-21 DIAGNOSIS — L409 Psoriasis, unspecified: Principal | ICD-10-CM

## 2020-12-21 DIAGNOSIS — M79604 Pain in right leg: Principal | ICD-10-CM

## 2020-12-21 DIAGNOSIS — M79605 Pain in left leg: Principal | ICD-10-CM

## 2020-12-21 DIAGNOSIS — L52 Erythema nodosum: Principal | ICD-10-CM

## 2020-12-21 DIAGNOSIS — R2689 Other abnormalities of gait and mobility: Principal | ICD-10-CM

## 2020-12-21 DIAGNOSIS — Z789 Other specified health status: Secondary | ICD-10-CM | POA: Diagnosis not present

## 2020-12-21 NOTE — Unmapped (Signed)
[facility]CCUPATIONAL THERAPY [place]  OUTPATIENT OCCUPATIONAL THERAPY  12/21/2020  Note Type: Treatment Note       Patient Name: Autumn Mcdonald  Date of Birth:1970/07/25  Diagnosis:   Encounter Diagnosis   Name Primary?   ??? Lymphedema Yes     Visit 3    Referring MD:  Ethlyn Gallery     Date OT Care Plan Established or Reviewed-10/19/20    Plan of Care Effective Date: 12/15/2020 - 01/15/2021    Assessment/Plan:    Assessment details:       Pt tolerated all treatment methods well with no adverse reaction to therapy. She was noted to have minimally dry skin in which therapist advised her to put lotion on when she returned home. Pt also was noted to have 2 scabbed areas on bilateral LE above medial ankles that were not weeping. Therapist will continue to monitor these areas. Pt did have moderately dense tissue that softened with MLD. This will be concentration moving forward along with skin integrity.       Pt is a 50 year old female with a medical and treatment dx of lymphedema.. Pt has previously had wounds on BLE that have now healed.  Pt has only been seen for initial evaluation and reevaluation/treatment due to period between the two where her legs were not in an appropriate state for treatment and pt would not return to therapy until the weeping had ceased as she was being wrapped by an outside wound center. Due to this she has not progressed in her goals so current goals will remain in place. Pt now Is at a state where she is appropriate for treatment and needs significant lifestyle changes for optimal health. Therapist has discussed this with pt and she is agreeable to start with physical therapy to start addressing LB strength, mobility, gait and balance. Pt presents with body system impairments including edema, skin integrity that impair her general health, ambulation, ADL, and IADL functions. She would benefit from continued outpatient occupational therapy to address lymphedema in order to reach highest level of function.   Impairments: fall risk, increased edema, uncontrolled swelling, impaired ADLs, decreased skin integrity and decreased mobility  ??  Pt also demonstrates a need for physical therapy due to high risk of falls, poor balance and decreased mobility.   Impairments: fall risk, impaired balance, increased edema, pain, decreased endurance, decreased mobility, impaired ADLs and decreased skin integrity    Personal Factors/Comorbidities: 3    Examination of Body Systems: 3 elements    Clinical Presentation: evolving  Clinical Decision Making: moderate  Prognosis: good prognosis  Positive Prognosis Rationale: age and motivated for treatment.  Negative Prognosis Rationale: insight, chronicity of condition, severity of symptoms, body habitus and balance.    Therapy Goals  Goals:       Goals:      Short Term Goals:  1. Pt's circumference of BLE will be reduced by 1cm at all measurements within  2 weeks at feet and knees.   CURRENT STATUS: regression. Continue goal for progression.    2. Pt's skin integrity of BLE will improve at ankles in 2 weeks to decrease  potential for infection.  CURRENT STATUS: improved. Continue for mastery.     3. Pt will be educated on self MLD and recall it with 100% accuracy to perform  in the home in 2 weeks.  CURRENT STATUS: Pt has been given information. She cannot yet recall it. Continue for mastery.  4. Pt's skin  texture of BLE will improve to a soft, supple state within 2  Weeks.  CURRENT STATUS: No improvement. Continue for mastery.    Long Term Goals:  1. Pt's circumference of bilateral lower extremities will be reduced by 2cm at  all appropriate measurements within 4 weeks to increase ADL function.  CURRENT STATUS: Regression. Continue for mastery.  2. Pt will recall all precautions to prevent lymphedema exacerbation at 100% in  4 weeks.  CURRENT STATUS: Pt has been given information. She is not yet independent in her recall. Continue goal for mastery.   3. Pt will be fitted for compression garments to address BLE edema with  independent donning in 4 weeks.  CURRENT STATUS: Pt has not yet been fitted for garments. She will need them for her upper BLE.   4. Pt will be independent with home management of lymphedema in 4 weeks. Continue for mastery.   CURRENT STATUS: Pt does not yet have the tools for independent management of lymphedema. Continue for mastery.   Plan  Therapy options: will be seen for skilled occupational therapy services  Planned therapy interventions: Compression Bandaging, Compression Pump, Education - Patient, Home Exercise Program, Garment Measurement, Therapeutic Exercises, Therapeutic Activities, Manual Therapy and Manual Lymph Drainage  DME Equipment: compression pump, lymphedema bandages and compression garments.  Frequency: 2x week  Duration in weeks: 4 weeks  Education provided to: patient.  Education provided: Importance of Therapy, HEP, Lymphedema precautions and Manual lymph drainage  Education results: needs reinforcement and needs further instruction.      Total Session Time: 55  Treatment rendered today:         Pt then lay in supine with HOB elevated for comfort. Therapist completed manual lymph drainage pre-treatment to bilateral axilla and then full MLD to BLE. Pt forgot her compression but will bring it to the next session.          Subjective:   History of Present Illness  Date of Onset: 05/17/2003    Date of Evaluation: 10/19/2020    Reason for Referral/Chief Complaint:       Pt reports that her legs have been swelling since 2005. She was sent to rumatologist in 2014/2015 with an appropriate dx then and it has progressively gotten worse. She has also been getting treatment for psoriasis. Pt has been seen at wound center in [place] for the past 17 weeks. She has not been released by them yet. Pt desires to wear appropriate shoes and to have a better life. She is seeking outpatient lymphedema treatment to address BLE lymphedema.   Subjective:     Pt reports that her legs felt so good after previous treatment session. She reports no pain on this date and was very happy to return to therapy.  Quality of life: fair    Pain  Current pain rating: 0  At best pain rating: 0  At worst pain rating: 10  Location: BLE  Quality: heavy and intermittent  Relieving factors: elevation  Aggravating factors: walking  Progression: worsening  Red flags: night pain and disturbed sleep.  Current functional status: limited walking tolerance, unsteady gait, disturbed sleep, leisure activities, limited standing tolerance, use of assistive device, limited recreation, limited household activities and limited exercise    Precautions and Equipment  Precautions: Fall risk and Cancer history  Equipment Currently Used: Metallurgist, Training and development officer  Social Support  Lives in: two story house  Lives with: family members  Hand dominance: right  Communication Preference:  verbal  Barriers to Learning: No Barriers  Work/School: on disability    Diagnostic Tests    Diagnostic Test Comments:       none    Treatments  No previous or current treatments      Patient Goals  Patient goals for therapy: decrease assistance with ADLs, decreased edema, decreased pain, improve fit of clothing, improve fit of shoes, improve independence with dressing, improved ambulation, improved sleep, independence with ADLs/IADLs, obtain compression garment and return to sport/leisure activities  Patient goal: obatin pneumatic pump      Objective:   Lymphedema:   Treatment:     Lymph node removal: No      Chemotherapy: No      Radiation therapy: No      Previous lymphedema treatment: No      Reoccurrence?: No    Current Management:     Daytime:  Nothing    Nighttime:  Nothing  Contraindications:     Contraindications:  None  Objective:     Location of swelling:  Right, left, ankle, toes, knee, thigh, groin and foot    Fold thickness:  Moderate    Fibrosis: No      Skin assessment:  Discoloration, edema and dry skin    Skin assessment comments:  Skin is noted to be lighter pink. No evidence of wounds on this date.    Incision:  N/A    Color:  Pink    Temperature:  Normal    Hair growth:  Reduced    Stemmer's sign:  +    Wound: No    Posture/Observations:     Sitting:  Rounded shoulders    Standing:  Forward flexed posture and wide BOS    Sleeping:  Recliner  ROM and Strength:     ROM and Strength findings:  BLE WFL  Tests:     Lower Extremity Functional Scale Score (out of 80):  2  Lymphedema additional information:  Measurements:    RLE:                    12/15/20:  MTP: 24.5cm        25cm  Heel: 37cm           36cm  Ankle: 31cm         33.1cm  10cm: 33cm         43.8cm   20cm: 49cm         57.5cm  Knee: 56.3cm       59.5cm  10cm: 84.2cm??      87cm   20cm: 92.8cm       97.5cm    LLE:  MTP: 25.6cm        24.5cm  Heel: 37.2cm        35.8cm  Ankle: 34cm          33cm  10cm: 36.3cm       41.5cm  20cm: 54.5cm       58.5cm  Knee: 59cm           73cm  10cm: 90.2cm       96cm  20cm: 100cm        99.9cm                               I attest that I have reviewed the above information.  Signed: Melvern Banker, OT  12/21/2020 3:27 PM

## 2020-12-22 ENCOUNTER — Other Ambulatory Visit: Payer: Self-pay | Admitting: Family Medicine

## 2020-12-22 DIAGNOSIS — F32A Depression, unspecified: Secondary | ICD-10-CM

## 2020-12-22 DIAGNOSIS — F419 Anxiety disorder, unspecified: Secondary | ICD-10-CM

## 2020-12-22 NOTE — Telephone Encounter (Signed)
LFD 09/24/20 #270 with no refills LOV 12/02/20 NOV 03/04/21

## 2020-12-24 DIAGNOSIS — M300 Polyarteritis nodosa: Principal | ICD-10-CM

## 2020-12-24 MED ORDER — GABAPENTIN 600 MG TABLET
ORAL_TABLET | Freq: Three times a day (TID) | ORAL | 11 refills | 30.00000 days
Start: 2020-12-24 — End: ?

## 2020-12-24 NOTE — Unmapped (Signed)
Last ov: 10/08/2020  Next ov: 03/11/2021

## 2020-12-25 NOTE — Unmapped (Signed)
Called patient 8/11, has another couple weeks left of pills. Will leave refill for when Dr. Scarlette Calico returns.

## 2020-12-28 MED ORDER — GABAPENTIN 600 MG TABLET
ORAL_TABLET | Freq: Three times a day (TID) | ORAL | 11 refills | 30 days | Status: CP
Start: 2020-12-28 — End: ?

## 2020-12-29 DIAGNOSIS — I890 Lymphedema, not elsewhere classified: Secondary | ICD-10-CM | POA: Diagnosis not present

## 2020-12-29 DIAGNOSIS — Z789 Other specified health status: Secondary | ICD-10-CM | POA: Diagnosis not present

## 2020-12-29 DIAGNOSIS — R2689 Other abnormalities of gait and mobility: Secondary | ICD-10-CM | POA: Diagnosis not present

## 2020-12-30 ENCOUNTER — Other Ambulatory Visit: Payer: Self-pay | Admitting: Registered Nurse

## 2020-12-30 NOTE — Unmapped (Signed)
[facility] OCCUPATIONAL THERAPY [place]  OUTPATIENT OCCUPATIONAL THERAPY  12/29/2020  Note Type: Treatment Note       Patient Name: Autumn Mcdonald  Date of Birth:12/09/1970  Diagnosis:   Encounter Diagnosis   Name Primary?   ??? Lymphedema Yes     Visit 4    Referring MD:  Ethlyn Gallery     Date OT Care Plan Established or Reviewed-10/19/20    Plan of Care Effective Date: 12/15/2020 - 01/15/2021    Assessment/Plan:    Assessment details:       Pt tolerated all treatment methods well with no adverse reaction to therapy. She was noted to have minimally dry skin in which therapist advised her to put lotion on when she returned home. Pt also was noted to have 1 scabbed area on bilateral LE above medial ankle that was not weeping. Therapist will continue to monitor this area. Pt did have moderately dense tissue that softened with MLD. This will be concentration moving forward along with skin integrity.       Pt is a 50 year old female with a medical and treatment dx of lymphedema.. Pt has previously had wounds on BLE that have now healed.  Pt has only been seen for initial evaluation and reevaluation/treatment due to period between the two where her legs were not in an appropriate state for treatment and pt would not return to therapy until the weeping had ceased as she was being wrapped by an outside wound center. Due to this she has not progressed in her goals so current goals will remain in place. Pt now Is at a state where she is appropriate for treatment and needs significant lifestyle changes for optimal health. Therapist has discussed this with pt and she is agreeable to start with physical therapy to start addressing LB strength, mobility, gait and balance. Pt presents with body system impairments including edema, skin integrity that impair her general health, ambulation, ADL, and IADL functions. She would benefit from continued outpatient occupational therapy to address lymphedema in order to reach highest level of function.   Impairments: fall risk, increased edema, uncontrolled swelling, impaired ADLs, decreased skin integrity and decreased mobility  ??  Pt also demonstrates a need for physical therapy due to high risk of falls, poor balance and decreased mobility.   Impairments: fall risk, impaired balance, increased edema, pain, decreased endurance, decreased mobility, impaired ADLs and decreased skin integrity    Personal Factors/Comorbidities: 3    Examination of Body Systems: 3 elements    Clinical Presentation: evolving  Clinical Decision Making: moderate  Prognosis: good prognosis  Positive Prognosis Rationale: age and motivated for treatment.  Negative Prognosis Rationale: insight, chronicity of condition, severity of symptoms, body habitus and balance.    Therapy Goals  Goals:       Goals:      Short Term Goals:  1. Pt's circumference of BLE will be reduced by 1cm at all measurements within  2 weeks at feet and knees.   CURRENT STATUS: regression. Continue goal for progression.    2. Pt's skin integrity of BLE will improve at ankles in 2 weeks to decrease  potential for infection.  CURRENT STATUS: improved. Continue for mastery.     3. Pt will be educated on self MLD and recall it with 100% accuracy to perform  in the home in 2 weeks.  CURRENT STATUS: Pt has been given information. She cannot yet recall it. Continue for mastery.  4. Pt's skin  texture of BLE will improve to a soft, supple state within 2  Weeks.  CURRENT STATUS: No improvement. Continue for mastery.    Long Term Goals:  1. Pt's circumference of bilateral lower extremities will be reduced by 2cm at  all appropriate measurements within 4 weeks to increase ADL function.  CURRENT STATUS: Regression. Continue for mastery.  2. Pt will recall all precautions to prevent lymphedema exacerbation at 100% in  4 weeks.  CURRENT STATUS: Pt has been given information. She is not yet independent in her recall. Continue goal for mastery.   3. Pt will be fitted for compression garments to address BLE edema with  independent donning in 4 weeks.  CURRENT STATUS: Pt has not yet been fitted for garments. She will need them for her upper BLE.   4. Pt will be independent with home management of lymphedema in 4 weeks. Continue for mastery.   CURRENT STATUS: Pt does not yet have the tools for independent management of lymphedema. Continue for mastery.   Plan  Therapy options: will be seen for skilled occupational therapy services  Planned therapy interventions: Compression Bandaging, Compression Pump, Education - Patient, Home Exercise Program, Garment Measurement, Therapeutic Exercises, Therapeutic Activities, Manual Therapy and Manual Lymph Drainage  DME Equipment: compression pump, lymphedema bandages and compression garments.  Frequency: 2x week  Duration in weeks: 4 weeks  Education provided to: patient.  Education provided: Importance of Therapy, HEP, Lymphedema precautions and Manual lymph drainage  Education results: needs reinforcement and needs further instruction.      Total Session Time: 38  Treatment rendered today:         Pt then lay in supine with HOB elevated for comfort. Therapist completed manual lymph drainage pre-treatment to bilateral axilla and then full MLD to BLE. Pt forgot her compression but will bring it to the next session.          Subjective:   History of Present Illness  Date of Onset: 05/17/2003    Date of Evaluation: 10/19/2020    Reason for Referral/Chief Complaint:       Pt reports that her legs have been swelling since 2005. She was sent to rumatologist in 2014/2015 with an appropriate dx then and it has progressively gotten worse. She has also been getting treatment for psoriasis. Pt has been seen at wound center in [place] for the past 17 weeks. She has not been released by them yet. Pt desires to wear appropriate shoes and to have a better life. She is seeking outpatient lymphedema treatment to address BLE lymphedema.   Subjective:     Pt reports that her legs have no pain today.  Quality of life: fair    Pain  Current pain rating: 0  At best pain rating: 0  At worst pain rating: 10  Location: BLE  Quality: heavy and intermittent  Relieving factors: elevation  Aggravating factors: walking  Progression: worsening  Red flags: night pain and disturbed sleep.  Current functional status: limited walking tolerance, unsteady gait, disturbed sleep, leisure activities, limited standing tolerance, use of assistive device, limited recreation, limited household activities and limited exercise    Precautions and Equipment  Precautions: Fall risk and Cancer history  Equipment Currently Used: Metallurgist, Training and development officer  Social Support  Lives in: two story house  Lives with: family members  Hand dominance: right  Communication Preference: verbal  Barriers to Learning: No Barriers  Work/School: on disability    Diagnostic Tests  Diagnostic Test Comments:       none    Treatments  No previous or current treatments      Patient Goals  Patient goals for therapy: decrease assistance with ADLs, decreased edema, decreased pain, improve fit of clothing, improve fit of shoes, improve independence with dressing, improved ambulation, improved sleep, independence with ADLs/IADLs, obtain compression garment and return to sport/leisure activities  Patient goal: obatin pneumatic pump      Objective:   Lymphedema:   Treatment:     Lymph node removal: No      Chemotherapy: No      Radiation therapy: No      Previous lymphedema treatment: No      Reoccurrence?: No    Current Management:     Daytime:  Nothing    Nighttime:  Nothing  Contraindications:     Contraindications:  None  Objective:     Location of swelling:  Right, left, ankle, toes, knee, thigh, groin and foot    Fold thickness:  Moderate    Fibrosis: No      Skin assessment:  Discoloration, edema and dry skin    Skin assessment comments:  Skin is noted to be lighter pink. No evidence of wounds on this date.    Incision:  N/A    Color:  Pink    Temperature:  Normal    Hair growth:  Reduced    Stemmer's sign:  +    Wound: No    Posture/Observations:     Sitting:  Rounded shoulders    Standing:  Forward flexed posture and wide BOS    Sleeping:  Recliner  ROM and Strength:     ROM and Strength findings:  BLE WFL  Tests:     Lower Extremity Functional Scale Score (out of 80):  2  Lymphedema additional information:  Measurements:    RLE:                    12/15/20:  MTP: 24.5cm        25cm  Heel: 37cm           36cm  Ankle: 31cm         33.1cm  10cm: 33cm         43.8cm   20cm: 49cm         57.5cm  Knee: 56.3cm       59.5cm  10cm: 84.2cm??      87cm   20cm: 92.8cm       97.5cm    LLE:  MTP: 25.6cm        24.5cm  Heel: 37.2cm        35.8cm  Ankle: 34cm          33cm  10cm: 36.3cm       41.5cm  20cm: 54.5cm       58.5cm  Knee: 59cm           73cm  10cm: 90.2cm       96cm  20cm: 100cm        99.9cm                               I attest that I have reviewed the above information.  Signed: Melvern Banker, OT  12/29/2020 2:16 PM

## 2020-12-30 NOTE — Telephone Encounter (Signed)
LFD 12/04/20 #90 with no refills LOV 12/02/20 NOV 03/04/21

## 2020-12-31 DIAGNOSIS — M1990 Unspecified osteoarthritis, unspecified site: Principal | ICD-10-CM

## 2020-12-31 MED ORDER — DICLOFENAC 1 % TOPICAL GEL
5 refills | 0 days
Start: 2020-12-31 — End: ?

## 2021-01-04 DIAGNOSIS — M1990 Unspecified osteoarthritis, unspecified site: Principal | ICD-10-CM

## 2021-01-04 MED ORDER — DICLOFENAC 1 % TOPICAL GEL
TOPICAL | 5 refills | 0.00000 days | Status: CP
Start: 2021-01-04 — End: ?

## 2021-01-04 NOTE — Unmapped (Signed)
Voltaren refill.  Last ov: 10/08/2020  Next ov: 03/11/2021

## 2021-01-05 DIAGNOSIS — I890 Lymphedema, not elsewhere classified: Principal | ICD-10-CM

## 2021-01-05 DIAGNOSIS — R2689 Other abnormalities of gait and mobility: Principal | ICD-10-CM

## 2021-01-05 DIAGNOSIS — M79604 Pain in right leg: Principal | ICD-10-CM

## 2021-01-05 DIAGNOSIS — L52 Erythema nodosum: Principal | ICD-10-CM

## 2021-01-05 DIAGNOSIS — M79605 Pain in left leg: Principal | ICD-10-CM

## 2021-01-05 DIAGNOSIS — L409 Psoriasis, unspecified: Principal | ICD-10-CM

## 2021-01-05 DIAGNOSIS — Z789 Other specified health status: Secondary | ICD-10-CM | POA: Diagnosis not present

## 2021-01-05 NOTE — Unmapped (Signed)
[facility] OCCUPATIONAL THERAPY [facility]  OUTPATIENT OCCUPATIONAL THERAPY  01/05/2021  Note Type: Treatment Note       Patient Name: Autumn Mcdonald  Date of Birth:April 25, 1971  Diagnosis:   Encounter Diagnosis   Name Primary?   ??? Lymphedema Yes     Visit 5    Referring MD:  Ethlyn Gallery     Date OT Care Plan Established or Reviewed-10/19/20    Plan of Care Effective Date: 12/15/2020 - 01/15/2021    Assessment/Plan:    Assessment details:       Pt tolerated all treatment methods well with no adverse reaction to therapy. Pt's BLE skin integrity was poor on this date as skin was noted to be deep pink with significant weeping. No open wounds noted just drainage. No other s/s of infection. Pt did have moderately dense tissue that softened with MLD. This will be concentration moving forward along with skin integrity.       Pt is a 50 year old female with a medical and treatment dx of lymphedema.. Pt has previously had wounds on BLE that have now healed.  Pt has only been seen for initial evaluation and reevaluation/treatment due to period between the two where her legs were not in an appropriate state for treatment and pt would not return to therapy until the weeping had ceased as she was being wrapped by an outside wound center. Due to this she has not progressed in her goals so current goals will remain in place. Pt now Is at a state where she is appropriate for treatment and needs significant lifestyle changes for optimal health. Therapist has discussed this with pt and she is agreeable to start with physical therapy to start addressing LB strength, mobility, gait and balance. Pt presents with body system impairments including edema, skin integrity that impair her general health, ambulation, ADL, and IADL functions. She would benefit from continued outpatient occupational therapy to address lymphedema in order to reach highest level of function.   Impairments: fall risk, increased edema, uncontrolled swelling, impaired ADLs, decreased skin integrity and decreased mobility  ??  Pt also demonstrates a need for physical therapy due to high risk of falls, poor balance and decreased mobility.   Impairments: fall risk, impaired balance, increased edema, pain, decreased endurance, decreased mobility, impaired ADLs and decreased skin integrity    Personal Factors/Comorbidities: 3    Examination of Body Systems: 3 elements    Clinical Presentation: evolving  Clinical Decision Making: moderate  Prognosis: good prognosis  Positive Prognosis Rationale: age and motivated for treatment.  Negative Prognosis Rationale: insight, chronicity of condition, severity of symptoms, body habitus and balance.    Therapy Goals  Goals:       Goals:      Short Term Goals:  1. Pt's circumference of BLE will be reduced by 1cm at all measurements within  2 weeks at feet and knees.   CURRENT STATUS: regression. Continue goal for progression.    2. Pt's skin integrity of BLE will improve at ankles in 2 weeks to decrease  potential for infection.  CURRENT STATUS: improved. Continue for mastery.     3. Pt will be educated on self MLD and recall it with 100% accuracy to perform  in the home in 2 weeks.  CURRENT STATUS: Pt has been given information. She cannot yet recall it. Continue for mastery.  4. Pt's skin texture of BLE will improve to a soft, supple state within 2  Weeks.  CURRENT  STATUS: No improvement. Continue for mastery.    Long Term Goals:  1. Pt's circumference of bilateral lower extremities will be reduced by 2cm at  all appropriate measurements within 4 weeks to increase ADL function.  CURRENT STATUS: Regression. Continue for mastery.  2. Pt will recall all precautions to prevent lymphedema exacerbation at 100% in  4 weeks.  CURRENT STATUS: Pt has been given information. She is not yet independent in her recall. Continue goal for mastery.   3. Pt will be fitted for compression garments to address BLE edema with  independent donning in 4 weeks.  CURRENT STATUS: Pt has not yet been fitted for garments. She will need them for her upper BLE.   4. Pt will be independent with home management of lymphedema in 4 weeks. Continue for mastery.   CURRENT STATUS: Pt does not yet have the tools for independent management of lymphedema. Continue for mastery.   Plan  Therapy options: will be seen for skilled occupational therapy services  Planned therapy interventions: Compression Bandaging, Compression Pump, Education - Patient, Home Exercise Program, Garment Measurement, Therapeutic Exercises, Therapeutic Activities, Manual Therapy and Manual Lymph Drainage  DME Equipment: compression pump, lymphedema bandages and compression garments.  Frequency: 2x week  Duration in weeks: 4 weeks  Education provided to: patient.  Education provided: Importance of Therapy, HEP, Lymphedema precautions and Manual lymph drainage  Education results: needs reinforcement and needs further instruction.      Total Session Time: 55  Treatment rendered today:         Pt then lay in supine with HOB elevated for comfort. Therapist completed manual lymph drainage pre-treatment to bilateral axilla and then full MLD to BLE. Therapist placed size G stockinette on BLE with 2 rolls of coban on each leg to maintain results of MLD.         Subjective:   History of Present Illness  Date of Onset: 05/17/2003    Date of Evaluation: 10/19/2020    Reason for Referral/Chief Complaint:       Pt reports that her legs have been swelling since 2005. She was sent to rumatologist in 2014/2015 with an appropriate dx then and it has progressively gotten worse. She has also been getting treatment for psoriasis. Pt has been seen at wound center in [place] for the past 17 weeks. She has not been released by them yet. Pt desires to wear appropriate shoes and to have a better life. She is seeking outpatient lymphedema treatment to address BLE lymphedema.   Subjective:     Pt reports that her legs have no pain today. She reports that she did have a fall over the weekend with no report of injury. As a result her BLE are red and weeping. No increase in warmth, pain, edema. Therapist checked for additional s/s of infection and will continue to monitor. Therapist also notes that request for PT order from referring physician has not arrived (requested last week). She informed pt, who is now going to reach out to physician.  Quality of life: fair    Pain  Current pain rating: 0  At best pain rating: 0  At worst pain rating: 10  Location: BLE  Quality: heavy and intermittent  Relieving factors: elevation  Aggravating factors: walking  Progression: worsening  Red flags: night pain and disturbed sleep.  Current functional status: limited walking tolerance, unsteady gait, disturbed sleep, leisure activities, limited standing tolerance, use of assistive device, limited recreation, limited household activities and limited exercise  Precautions and Equipment  Precautions: Fall risk and Cancer history  Equipment Currently Used: Metallurgist, Training and development officer  Social Support  Lives in: two story house  Lives with: family members  Hand dominance: right  Communication Preference: verbal  Barriers to Learning: No Barriers  Work/School: on disability    Diagnostic Tests    Diagnostic Test Comments:       none    Treatments  No previous or current treatments      Patient Goals  Patient goals for therapy: decrease assistance with ADLs, decreased edema, decreased pain, improve fit of clothing, improve fit of shoes, improve independence with dressing, improved ambulation, improved sleep, independence with ADLs/IADLs, obtain compression garment and return to sport/leisure activities  Patient goal: obatin pneumatic pump      Objective:   Lymphedema:   Treatment:     Lymph node removal: No      Chemotherapy: No      Radiation therapy: No      Previous lymphedema treatment: No      Reoccurrence?: No    Current Management:     Daytime:  Nothing Nighttime:  Nothing  Contraindications:     Contraindications:  None  Objective:     Location of swelling:  Right, left, ankle, toes, knee, thigh, groin and foot    Fold thickness:  Moderate    Fibrosis: No      Skin assessment:  Discoloration, edema and dry skin    Skin assessment comments:  Skin is noted to be lighter pink. No evidence of wounds on this date.    Incision:  N/A    Color:  Pink    Temperature:  Normal    Hair growth:  Reduced    Stemmer's sign:  +    Wound: No    Posture/Observations:     Sitting:  Rounded shoulders    Standing:  Forward flexed posture and wide BOS    Sleeping:  Recliner  ROM and Strength:     ROM and Strength findings:  BLE WFL  Tests:     Lower Extremity Functional Scale Score (out of 80):  2  Lymphedema additional information:  Measurements:    RLE:                    12/15/20:  MTP: 24.5cm        25cm  Heel: 37cm           36cm  Ankle: 31cm         33.1cm  10cm: 33cm         43.8cm   20cm: 49cm         57.5cm  Knee: 56.3cm       59.5cm  10cm: 84.2cm??      87cm   20cm: 92.8cm       97.5cm    LLE:  MTP: 25.6cm        24.5cm  Heel: 37.2cm        35.8cm  Ankle: 34cm          33cm  10cm: 36.3cm       41.5cm  20cm: 54.5cm       58.5cm  Knee: 59cm           73cm  10cm: 90.2cm       96cm  20cm: 100cm        99.9cm  I attest that I have reviewed the above information.  Signed: Melvern Banker, OT  01/05/2021 4:36 PM

## 2021-01-06 DIAGNOSIS — N879 Dysplasia of cervix uteri, unspecified: Secondary | ICD-10-CM | POA: Insufficient documentation

## 2021-01-06 DIAGNOSIS — K760 Fatty (change of) liver, not elsewhere classified: Secondary | ICD-10-CM | POA: Insufficient documentation

## 2021-01-07 NOTE — Unmapped (Signed)
Missed appointment:     Pt c/c secondary to not feeling well.

## 2021-01-08 NOTE — Unmapped (Signed)
pt would ike to have her referral for PT  sent to [facility]- [phone].  Thanks

## 2021-01-14 ENCOUNTER — Telehealth: Payer: Self-pay

## 2021-01-14 DIAGNOSIS — R2689 Other abnormalities of gait and mobility: Principal | ICD-10-CM

## 2021-01-14 DIAGNOSIS — I890 Lymphedema, not elsewhere classified: Principal | ICD-10-CM

## 2021-01-14 DIAGNOSIS — L52 Erythema nodosum: Principal | ICD-10-CM

## 2021-01-14 DIAGNOSIS — M79604 Pain in right leg: Principal | ICD-10-CM

## 2021-01-14 DIAGNOSIS — L409 Psoriasis, unspecified: Principal | ICD-10-CM

## 2021-01-14 DIAGNOSIS — M79605 Pain in left leg: Principal | ICD-10-CM

## 2021-01-14 DIAGNOSIS — Z789 Other specified health status: Secondary | ICD-10-CM | POA: Diagnosis not present

## 2021-01-14 NOTE — Unmapped (Signed)
[facility] OCCUPATIONAL THERAPY [facility]  OUTPATIENT OCCUPATIONAL THERAPY  01/14/2021  Note Type: Re-evaluation/Progress Note  Note Date Range: 12/15/20 through 01/14/21    Patient Name: Autumn Mcdonald  Date of Birth:[birth year]  Diagnosis:   Encounter Diagnosis   Name Primary?   ??? Lymphedema Yes     Visit 6    Referring MD:  Ethlyn Gallery     Date OT Care Plan Established or Reviewed-10/19/20    Plan of Care Effective Date: 01/14/2021 - 02/13/2021    Assessment/Plan:    Assessment details:       Pt tolerated all treatment methods well with no adverse reaction to therapy. Pt's BLE skin integrity was poor on this date as skin was noted to be deep pink with significant weeping. No open wounds noted just drainage. No other s/s of infection. Pt did have moderately dense tissue that softened with MLD. This will be concentration moving forward along with skin integrity.       Pt is a 50 year old female with a medical and treatment dx of lymphedema. Pt has been seen for 6 outpatient occupational therapy treatment sessions with good tolerance for all treatment methods. She has made good progress in goals but recently had a set back with additional weeping in BLE that we are now addressing. She does not have significant wounds, just weeping. Compression bandaging is assisting with resolving this. Please see goals for updates.  Pt presents with body system impairments including edema, skin integrity that impair her general health, ambulation, ADL, and IADL functions. She would benefit from continued outpatient occupational therapy to address lymphedema in order to reach highest level of function.   Impairments: fall risk, increased edema, uncontrolled swelling, impaired ADLs, decreased skin integrity and decreased mobility  ??  Pt also demonstrates a need for physical therapy due to high risk of falls, poor balance and decreased mobility.   Impairments: fall risk, impaired balance, increased edema, pain, decreased endurance, decreased mobility, impaired ADLs and decreased skin integrity    Personal Factors/Comorbidities: 3    Examination of Body Systems: 3 elements    Clinical Presentation: evolving  Clinical Decision Making: moderate  Prognosis: good prognosis  Positive Prognosis Rationale: age and motivated for treatment.  Negative Prognosis Rationale: insight, chronicity of condition, severity of symptoms, body habitus and balance.    Therapy Goals  Goals:       Goals:      Short Term Goals:  1. Pt's circumference of BLE will be reduced by 1cm at all measurements within  2 weeks at feet and knees.   CURRENT STATUS: Progress made.  Continue goal for mastery.    2. Pt's skin integrity of BLE will improve at ankles in 2 weeks to decrease  potential for infection.  CURRENT STATUS: BLE now weeping. Continue for mastery.     3. Pt will be educated on self MLD and recall it with 100% accuracy to perform  in the home in 2 weeks.  CURRENT STATUS: Pt has been given information. Goal in progress. Continue for mastery.  4. Pt's skin texture of BLE will improve to a soft, supple state within 2  Weeks.  CURRENT STATUS: Slow improvement. Continue for mastery.    Long Term Goals:  1. Pt's circumference of bilateral lower extremities will be reduced by 2cm at  all appropriate measurements within 4 weeks to increase ADL function.  CURRENT STATUS: Progression. Continue for mastery.  2. Pt will recall all precautions to prevent lymphedema  exacerbation at 100% in  4 weeks.  CURRENT STATUS: GOAL MET  3. Pt will be fitted for compression garments to address BLE edema with  independent donning in 4 weeks.  CURRENT STATUS: Pt has not yet been fitted for garments. She will need them for her upper BLE.   4. Pt will be independent with home management of lymphedema in 4 weeks. Continue for mastery.   CURRENT STATUS: Pt does not yet have the tools for independent management of lymphedema. Continue for mastery.   Plan  Therapy options: will be seen for skilled occupational therapy services  Planned therapy interventions: Compression Bandaging, Compression Pump, Education - Patient, Home Exercise Program, Garment Measurement, Therapeutic Exercises, Therapeutic Activities, Manual Therapy and Manual Lymph Drainage  DME Equipment: compression pump, lymphedema bandages and compression garments.  Frequency: 2x week  Duration in weeks: 4 weeks  Education provided to: patient.  Education provided: Importance of Therapy, HEP, Lymphedema precautions and Manual lymph drainage  Education results: needs reinforcement and needs further instruction.      Total Session Time: 55  Treatment rendered today:         Pt then lay in supine with HOB elevated for comfort. Therapist completed manual lymph drainage pre-treatment to bilateral axilla and then full MLD to BLE. Therapist placed size G stockinette on BLE with 2 rolls of coban on each leg to maintain results of MLD.         Subjective:   History of Present Illness  Date of Onset: 05/17/2003    Date of Evaluation: 10/19/2020    Reason for Referral/Chief Complaint:       Pt reports that her legs have been swelling since 2005. She was sent to rumatologist in 2014/2015 with an appropriate dx then and it has progressively gotten worse. She has also been getting treatment for psoriasis. Pt has been seen at wound center in [place] for the past 17 weeks. She has not been released by them yet. Pt desires to wear appropriate shoes and to have a better life. She is seeking outpatient lymphedema treatment to address BLE lymphedema.   Subjective:     Pt reports that her legs have no pain today but she does report increased weeping without other s/s of infection. Pt reports that the weeping has increased since last session without causation.  Therapist has not yet gotten PT referral back for pt and physician has also not signed letter of medical necessity for pneumatic pump as vendor has reported this to therapist. Therapist has informed pt who will reach out to referring physician.   Quality of life: fair    Pain  Current pain rating: 0  At best pain rating: 0  At worst pain rating: 10  Location: BLE  Quality: heavy and intermittent  Relieving factors: elevation  Aggravating factors: walking  Progression: worsening  Red flags: night pain and disturbed sleep.  Current functional status: limited walking tolerance, unsteady gait, disturbed sleep, leisure activities, limited standing tolerance, use of assistive device, limited recreation, limited household activities and limited exercise    Precautions and Equipment  Precautions: Fall risk and Cancer history  Equipment Currently Used: Metallurgist, Training and development officer  Social Support  Lives in: two story house  Lives with: family members  Hand dominance: right  Communication Preference: verbal  Barriers to Learning: No Barriers  Work/School: on disability    Diagnostic Tests    Diagnostic Test Comments:       none  Treatments  No previous or current treatments      Patient Goals  Patient goals for therapy: decrease assistance with ADLs, decreased edema, decreased pain, improve fit of clothing, improve fit of shoes, improve independence with dressing, improved ambulation, improved sleep, independence with ADLs/IADLs, obtain compression garment and return to sport/leisure activities  Patient goal: obatin pneumatic pump      Objective:   Lymphedema:   Treatment:     Lymph node removal: No      Chemotherapy: No      Radiation therapy: No      Previous lymphedema treatment: No      Reoccurrence?: No    Current Management:     Current management details:  CDT, compression placed by therapist    Daytime:  Performs exercise    Nighttime:  Nothing  Contraindications:     Contraindications:  None  Objective:     Location of swelling:  Right, left, ankle, toes, knee, thigh, groin and foot    Fold thickness:  Moderate    Fibrosis: No      Skin assessment:  Discoloration, edema and dry skin    Skin assessment comments: Skin is noted to be dark pink. Weeping noted    Incision:  N/A    Color:  Pink    Temperature:  Normal    Hair growth:  Reduced    Stemmer's sign:  +    Wound: No    Posture/Observations:     Sitting:  Rounded shoulders    Standing:  Forward flexed posture and wide BOS    Sleeping:  Recliner  ROM and Strength:     ROM and Strength findings:  BLE WFL  Tests:     Lower Extremity Functional Scale Score (out of 80):  2  Lymphedema additional information:  Measurements:    RLE:                    12/15/20:         01/14/21:  MTP: 24.5cm        25cm         24cm  Heel: 37cm           36cm          35.5cm  Ankle: 31cm         33.1cm        31.2cm  10cm: 33cm         43.8cm        42.2cm  20cm: 49cm         57.5cm        48.5cm  Knee: 56.3cm       59.5cm        56.5cm  10cm: 84.2cm??      87cm          86.6cm  20cm: 92.8cm       97.5cm       95.5cm     LLE:  MTP: 25.6cm        24.5cm       24cm  Heel: 37.2cm        35.8cm        35.4cm  Ankle: 34cm          33cm          33cm  10cm: 36.3cm       41.5cm        41cm  20cm: 54.5cm       58.5cm  54.1cm  Knee: 69cm           73cm           68.5cm  10cm: 90.2cm       96cm           90.1cm  20cm: 100cm        99.9cm        98cm                               I attest that I have reviewed the above information.  Signed: Melvern Banker, OT  01/14/2021 5:15 PM

## 2021-01-14 NOTE — Progress Notes (Signed)
Chronic Care Management Pharmacy Assistant   Name: [name]  MRN: [id number] DOB: [birth year]   Reason for Encounter: Disease State - General Adherence     Recent office visits:  12/02/20 Merri Ray, MD - Family Medicine (Video visit) - Sinus Pain - Amoxicillin (AMOXIL) 875 MG tablet 2 (two) times daily for 10 days prescribed. Follow up not indicated.   10/05/20 [name], MD (PCP) - Family Medicine - Dysuria - Labs were ordered. Ciprofloxacin (CIPRO) 500 MG tablet Take 1 tablet (500 mg total) by mouth 2 (two) times daily prescribed. Follow up as needed.   09/04/20 [name], MD (PCP) - Family Medicine - Routine Annual Exam - Labs were ordered. No medication changes. Follow up in 6 months.   08/07/20 [name], MD (PCP) - Family Medicine - Furuncle of right axilla - Amoxicillin-clavulanate (AUGMENTIN) 875-125 MG tablet Take 1 tablet by mouth 2 (two) times daily prescribed. Phentermine 37.5 MG capsule 1 tablet daily prescribed. No follow up indicated.   Recent consult visits:  Occupational Therapy - Berneta Sages - 12/21/20, 12/15/20, 10/19/20  Wound Care - Risa Grill - 11/20/20, 11/12/20, 11/09/20, 11/06/20, 10/21/20, 10/09/20, 09/22/20, 08/04/20, 07/28/20, 07/21/20, 07/20/20, 07/14/20, 08/06/20, 07/07/20, 06/30/20, 06/23/20, 06/16/20.  10/08/20 Blima Dessert. MD - Rheumatology - Erythema Induratum - Referral to Lymphedema Clinic placed. Recommend topical diclofenac (Voltaren) gel for pain relief of your knees COVID 19 booster administered. Follow up not indicated.   07/31/20 [name], PA - Rheumatology - PAN - No medication changes. Follow up in 2 months.    Hospital visits: 07/09/20   Medication Reconciliation was completed by comparing discharge summary, patient's EMR and Pharmacy list, and upon discussion with patient.  Admitted to the hospital on 07/09/20 due to Left ear pain. Discharge date was 07/09/20. Discharged from [facility].  New?Medications Started at  [facility] Discharge:?? No medications were started.  Medication Changes at Hospital Discharge: No changes noted.   Medications Discontinued at Hospital Discharge: No medications were discontinued at discharge.  Medications that remain the same after Hospital Discharge:??  All other medications will remain the same.    Medications: Outpatient Encounter Medications as of 01/14/2021  Medication Sig   ALPRAZolam (XANAX) 0.5 MG tablet TAKE 1 TABLET BY MOUTH 2 TIMES DAILY AS NEEDED FOR ANXIETY   ARIPiprazole (ABILIFY) 10 MG tablet Take 10 mg by mouth in the morning.    Armodafinil 250 MG tablet 3 (three) times daily. 8am, 2pm, 6pm   Biotin 10 MG TABS Take 10 mg by mouth in the morning.    buPROPion (WELLBUTRIN XL) 300 MG 24 hr tablet TAKE 1 TABLET BY MOUTH EVERY DAY   busPIRone (BUSPAR) 30 MG tablet TAKE 1 TABLET BY MOUTH 2 TIMES DAILY   calcium citrate (CALCITRATE - DOSED IN MG ELEMENTAL CALCIUM) 950 MG tablet Take 1 tablet by mouth daily.   clobetasol cream (TEMOVATE) [id number] % Apply 1 application topically 2 (two) times daily. Cover both legs with Colbetasol cream   clonazePAM (KLONOPIN) 1 MG tablet TAKE 1 TABLET BY MOUTH AT BEDTIME   collagenase (SANTYL) ointment Apply topically daily. apply Santyl to scabs on legs   cyclobenzaprine (FLEXERIL) 10 MG tablet TAKE 1 TABLET BY MOUTH 3 TIMES DAILY AS NEEDED   DULoxetine (CYMBALTA) 60 MG capsule Take 2 capsules (120 mg total) by mouth daily.   FEROSUL 325 (65 Fe) MG tablet TAKE 1 TABLET BY MOUTH EVERY DAY WITH BREAKFAST   fluconazole (DIFLUCAN) 100 MG tablet Take 1  tablet (100 mg total) by mouth daily.   furosemide (LASIX) 40 MG tablet TAKE 1 TABLET BY MOUTH ONCE DAILY. MAY take additional TABLET IF weight increases by 5lb AND call MD for further instructions   gabapentin (NEURONTIN) 600 MG tablet Take 1 tablet (600 mg total) by mouth 3 (three) times daily. TAKE 2 TABLETS BY MOUTH 3 TIMES DAILY   HYDROcodone-acetaminophen (NORCO) 10-325 MG tablet  TAKE 1 TABLET BY MOUTH EVERY 6 HOURS AS NEEDED   irbesartan (AVAPRO) 75 MG tablet TAKE 1 TABLET BY MOUTH EVERY DAY   meclizine (ANTIVERT) 25 MG tablet Take 1 tablet (25 mg total) by mouth 3 (three) times daily as needed for dizziness.   mupirocin ointment (BACTROBAN) 2 % APPLY TOPICALLY 2 TIMES DAILY   nebivolol (BYSTOLIC) 10 MG tablet TAKE 1 TABLET BY MOUTH EVERY DAY   nystatin (MYCOSTATIN/NYSTOP) powder Apply 1 application topically 3 (three) times daily.   Oxcarbazepine (TRILEPTAL) 300 MG tablet Take 300 mg by mouth 2 (two) times daily.   pantoprazole (PROTONIX) 40 MG tablet TAKE 1 TABLET BY MOUTH 2 TIMES DAILY   pentoxifylline (TRENTAL) 400 MG CR tablet Take 400 mg by mouth 3 (three) times daily with meals.    phentermine 37.5 MG capsule Take 1 capsule (37.5 mg total) by mouth every morning.   promethazine (PHENERGAN) 25 MG tablet TAKE 1 TABLET BY MOUTH EVERY 6 HOURS AS NEEDED FOR NAUSEA AND VOMITING   rosuvastatin (CRESTOR) 20 MG tablet TAKE 1 TABLET BY MOUTH EVERY DAY   senna-docusate (SENOKOT-S) 8.6-50 MG tablet Take 1 tablet by mouth at bedtime.   SKYRIZI, 150 MG DOSE, 75 MG/0.83ML PSKT Inject 150 mg into the skin every 3 (three) months.    SUMAtriptan (IMITREX) 100 MG tablet Take 1 tablet (100 mg total) by mouth every 2 (two) hours as needed for migraine. May repeat in 2 hours if headache persists or recurs.   No facility-administered encounter medications on file as of 01/14/2021.    Have you had any problems recently with your health? Patient denied any recent issues with her health.  Have you had any problems with your pharmacy? Patient denied having any issues with her health currently.  What issues or side effects are you having with your medications? Patient denied any issues or side effects with her current medications.   What would you like me to pass along to Madelin Rear, CPP for them to help you with?  Patient did not have anything she wanted to pass along to CPP at this  time.   What can we do to take care of you better? Patient did not have any recommendations at this time. She stated "everything is going really smooth right now".  Star Rating Drugs: Rosuvastatin (CRESTOR) 20 MG tablet - last filled 12/22/20 30 days (patient reported) Irbesartan (AVAPRO) 75 MG tablet - last filled 12/22/20 30 days (patient reported)  Future Appointments  Date Time Provider [place]  03/04/2021 10:30 AM [name], MD [facility] [place], [place] Pharmacist Assistant  [phone]  Time Spent: 40 minutes

## 2021-01-19 ENCOUNTER — Other Ambulatory Visit: Payer: Self-pay | Admitting: Family Medicine

## 2021-01-19 NOTE — Telephone Encounter (Signed)
LFD 03/11/20 #10 with 1 refill LOV 12/02/20 NOV 03/04/21

## 2021-01-20 ENCOUNTER — Encounter: Admit: 2021-01-20 | Payer: MEDICARE

## 2021-01-20 ENCOUNTER — Ambulatory Visit: Admit: 2021-01-20 | Payer: MEDICARE

## 2021-01-20 ENCOUNTER — Ambulatory Visit: Admit: 2021-01-20 | Discharge: 2021-02-15 | Payer: MEDICARE

## 2021-01-20 DIAGNOSIS — L409 Psoriasis, unspecified: Principal | ICD-10-CM

## 2021-01-20 DIAGNOSIS — M79604 Pain in right leg: Principal | ICD-10-CM

## 2021-01-20 DIAGNOSIS — I890 Lymphedema, not elsewhere classified: Principal | ICD-10-CM

## 2021-01-20 DIAGNOSIS — L52 Erythema nodosum: Principal | ICD-10-CM

## 2021-01-20 DIAGNOSIS — R2689 Other abnormalities of gait and mobility: Principal | ICD-10-CM

## 2021-01-20 DIAGNOSIS — M79605 Pain in left leg: Principal | ICD-10-CM

## 2021-01-20 DIAGNOSIS — Z9181 History of falling: Secondary | ICD-10-CM | POA: Diagnosis not present

## 2021-01-21 ENCOUNTER — Encounter: Payer: Self-pay | Admitting: Family Medicine

## 2021-01-21 ENCOUNTER — Telehealth (INDEPENDENT_AMBULATORY_CARE_PROVIDER_SITE_OTHER): Payer: Medicare Other | Admitting: Family Medicine

## 2021-01-21 ENCOUNTER — Other Ambulatory Visit: Payer: Self-pay | Admitting: Family Medicine

## 2021-01-21 DIAGNOSIS — I890 Lymphedema, not elsewhere classified: Principal | ICD-10-CM

## 2021-01-21 DIAGNOSIS — L539 Erythematous condition, unspecified: Secondary | ICD-10-CM | POA: Diagnosis not present

## 2021-01-21 DIAGNOSIS — R208 Other disturbances of skin sensation: Secondary | ICD-10-CM

## 2021-01-21 MED ORDER — DOXYCYCLINE HYCLATE 100 MG PO TABS: 100.0000 mg | ORAL_TABLET | Freq: Two times a day (BID) | ORAL | 0 refills | Status: DC

## 2021-01-21 NOTE — Unmapped (Signed)
[facility] OCCUPATIONAL THERAPY [place]  OUTPATIENT OCCUPATIONAL THERAPY  01/20/2021  Note Type: Treatment Note       Patient Name: Autumn Mcdonald  Date of Birth:03/05/1971  Diagnosis:   Encounter Diagnosis   Name Primary?   ??? Lymphedema Yes     Visit 7    Referring MD:  Ethlyn Gallery     Date OT Care Plan Established or Reviewed-10/19/20    Plan of Care Effective Date: 01/14/2021 - 02/13/2021    Assessment/Plan:    Assessment details:       Pt tolerated all treatment methods well with no adverse reaction to therapy. Pt's BLE skin integrity was poor on this date as skin was noted to be deep pink with significant weeping again. On this date BLEs were noted to be warm and tender to the touch by the conclusion of the session. No open wounds noted just drainage. Pt did have moderately dense tissue that softened with MLD. This will be concentration moving forward along with skin integrity. Due to potential s/s of infection, therapist encouraged pt to see her PCP.       Pt is a 50 year old female with a medical and treatment dx of lymphedema. Pt has been seen for 6 outpatient occupational therapy treatment sessions with good tolerance for all treatment methods. She has made good progress in goals but recently had a set back with additional weeping in BLE that we are now addressing. She does not have significant wounds, just weeping. Compression bandaging is assisting with resolving this. Please see goals for updates.  Pt presents with body system impairments including edema, skin integrity that impair her general health, ambulation, ADL, and IADL functions. She would benefit from continued outpatient occupational therapy to address lymphedema in order to reach highest level of function.   Impairments: fall risk, increased edema, uncontrolled swelling, impaired ADLs, decreased skin integrity and decreased mobility  ??  Pt also demonstrates a need for physical therapy due to high risk of falls, poor balance and decreased mobility. Impairments: fall risk, impaired balance, increased edema, pain, decreased endurance, decreased mobility, impaired ADLs and decreased skin integrity    Personal Factors/Comorbidities: 3    Examination of Body Systems: 3 elements    Clinical Presentation: evolving  Clinical Decision Making: moderate  Prognosis: good prognosis  Positive Prognosis Rationale: age and motivated for treatment.  Negative Prognosis Rationale: insight, chronicity of condition, severity of symptoms, body habitus and balance.    Therapy Goals  Goals:       Goals:      Short Term Goals:  1. Pt's circumference of BLE will be reduced by 1cm at all measurements within  2 weeks at feet and knees.   CURRENT STATUS: Progress made.  Continue goal for mastery.    2. Pt's skin integrity of BLE will improve at ankles in 2 weeks to decrease  potential for infection.  CURRENT STATUS: BLE now weeping. Continue for mastery.     3. Pt will be educated on self MLD and recall it with 100% accuracy to perform  in the home in 2 weeks.  CURRENT STATUS: Pt has been given information. Goal in progress. Continue for mastery.  4. Pt's skin texture of BLE will improve to a soft, supple state within 2  Weeks.  CURRENT STATUS: Slow improvement. Continue for mastery.    Long Term Goals:  1. Pt's circumference of bilateral lower extremities will be reduced by 2cm at  all appropriate measurements within  4 weeks to increase ADL function.  CURRENT STATUS: Progression. Continue for mastery.  2. Pt will recall all precautions to prevent lymphedema exacerbation at 100% in  4 weeks.  CURRENT STATUS: GOAL MET  3. Pt will be fitted for compression garments to address BLE edema with  independent donning in 4 weeks.  CURRENT STATUS: Pt has not yet been fitted for garments. She will need them for her upper BLE.   4. Pt will be independent with home management of lymphedema in 4 weeks. Continue for mastery.   CURRENT STATUS: Pt does not yet have the tools for independent management of lymphedema. Continue for mastery.   Plan  Therapy options: will be seen for skilled occupational therapy services  Planned therapy interventions: Compression Bandaging, Compression Pump, Education - Patient, Home Exercise Program, Garment Measurement, Therapeutic Exercises, Therapeutic Activities, Manual Therapy and Manual Lymph Drainage  DME Equipment: compression pump, lymphedema bandages and compression garments.  Frequency: 2x week  Duration in weeks: 4 weeks  Education provided to: patient.  Education provided: Importance of Therapy, HEP, Lymphedema precautions and Manual lymph drainage  Education results: needs reinforcement and needs further instruction.      Total Session Time: 55  Treatment rendered today:         Pt then lay in supine with HOB elevated for comfort. Therapist completed manual lymph drainage pre-treatment to bilateral axilla and then full MLD to BLE. Therapist placed size G stockinette on BLE with 2 rolls of coban on each leg to maintain results of MLD. Therapist instructed pt on s/s of infection.         Subjective:   History of Present Illness  Date of Onset: 05/17/2003    Date of Evaluation: 10/19/2020    Reason for Referral/Chief Complaint:       Pt reports that her legs have been swelling since 2005. She was sent to rumatologist in 2014/2015 with an appropriate dx then and it has progressively gotten worse. She has also been getting treatment for psoriasis. Pt has been seen at wound center in [place] for the past 17 weeks. She has not been released by them yet. Pt desires to wear appropriate shoes and to have a better life. She is seeking outpatient lymphedema treatment to address BLE lymphedema.   Subjective:     Pt reports pain in BLE 5/10 with a stinging/hot quality. Therapist has recommended that pt reach out to her PCP secondary to pt demonstrating several s/s of infection. Therapist also communicated with referring physician to get order for physical therapy.   Quality of life: fair    Pain  Current pain rating: 5  At best pain rating: 0  At worst pain rating: 10  Location: BLE  Quality: needle-like and hot  Relieving factors: elevation  Aggravating factors: walking  Progression: worsening  Red flags: night pain and disturbed sleep.  Current functional status: limited walking tolerance, unsteady gait, disturbed sleep, leisure activities, limited standing tolerance, use of assistive device, limited recreation, limited household activities and limited exercise    Precautions and Equipment  Precautions: Fall risk and Cancer history  Equipment Currently Used: Metallurgist, Training and development officer  Social Support  Lives in: two story house  Lives with: family members  Hand dominance: right  Communication Preference: verbal  Barriers to Learning: No Barriers  Work/School: on disability    Diagnostic Tests    Diagnostic Test Comments:       none    Treatments  No  previous or current treatments      Patient Goals  Patient goals for therapy: decrease assistance with ADLs, decreased edema, decreased pain, improve fit of clothing, improve fit of shoes, improve independence with dressing, improved ambulation, improved sleep, independence with ADLs/IADLs, obtain compression garment and return to sport/leisure activities  Patient goal: obatin pneumatic pump      Objective:   Lymphedema:   Treatment:     Lymph node removal: No      Chemotherapy: No      Radiation therapy: No      Previous lymphedema treatment: No      Reoccurrence?: No    Current Management:     Current management details:  CDT, compression placed by therapist    Daytime:  Performs exercise    Nighttime:  Nothing  Contraindications:     Contraindications:  None  Objective:     Location of swelling:  Right, left, ankle, toes, knee, thigh, groin and foot    Fold thickness:  Moderate    Fibrosis: No      Skin assessment:  Discoloration, edema and dry skin    Skin assessment comments:  Skin is noted to be dark pink. Weeping noted Incision:  N/A    Color:  Pink    Temperature:  Normal    Hair growth:  Reduced    Stemmer's sign:  +    Wound: No    Posture/Observations:     Sitting:  Rounded shoulders    Standing:  Forward flexed posture and wide BOS    Sleeping:  Recliner  ROM and Strength:     ROM and Strength findings:  BLE WFL  Tests:     Lower Extremity Functional Scale Score (out of 80):  2  Lymphedema additional information:  Measurements:    RLE:                    12/15/20:         01/14/21:  MTP: 24.5cm        25cm         24cm  Heel: 37cm           36cm          35.5cm  Ankle: 31cm         33.1cm        31.2cm  10cm: 33cm         43.8cm        42.2cm  20cm: 49cm         57.5cm        48.5cm  Knee: 56.3cm       59.5cm        56.5cm  10cm: 84.2cm??      87cm          86.6cm  20cm: 92.8cm       97.5cm       95.5cm     LLE:  MTP: 25.6cm        24.5cm       24cm  Heel: 37.2cm        35.8cm        35.4cm  Ankle: 34cm          33cm          33cm  10cm: 36.3cm       41.5cm        41cm  20cm: 54.5cm       58.5cm       54.1cm  Knee: 69cm           73cm           68.5cm  10cm: 90.2cm       96cm           90.1cm  20cm: 100cm        99.9cm        98cm                               I attest that I have reviewed the above information.  Signed: Melvern Banker, OT  01/20/2021 2:35 PM

## 2021-01-21 NOTE — Unmapped (Signed)
Called to clarify. Patient stating needing another referral but unclear reasons. Message sent to OT for clarification.

## 2021-01-21 NOTE — Patient Instructions (Signed)
Schedule a follow up inperson with your primary care office next week. Please call their office today to schedule.  -I sent the medication(s) we discussed to your pharmacy: Meds ordered this encounter  Medications   doxycycline (VIBRA-TABS) 100 MG tablet    Sig: Take 1 tablet (100 mg total) by mouth 2 (two) times daily.    Dispense:  10 tablet    Refill:  0     I hope you are feeling better soon!  Seek in person care promptly if your symptoms worsen, new concerns arise or you are not improving with treatment.  It was nice to meet you today. I help [name] out with telemedicine visits on Tuesdays and Thursdays and am available for visits on those days. If you have any concerns or questions following this visit please schedule a follow up visit with your Primary Care doctor or seek care at a local urgent care clinic to avoid delays in care.

## 2021-01-21 NOTE — Telephone Encounter (Signed)
LFD 09/29/20 #30 with 3 refills LOV 12/02/20 NOV 03/04/21

## 2021-01-21 NOTE — Progress Notes (Signed)
Virtual Visit via Video Note  I connected with Teria  on 01/21/21 at  3:00 PM EDT by a video enabled telemedicine application and verified that I am speaking with the correct person using two identifiers.  Location patient: home, [place] Location provider:work or home office Persons participating in the virtual visit: patient, provider  I discussed the limitations of evaluation and management by telemedicine and the availability of in person appointments. The patient expressed understanding and agreed to proceed.   HPI:  Acute telemedicine visit for skin rash: -Onset: yesterday -Symptoms include: skin on R leg getting a little red and hot and feels burning feeling in the skin - reports hx of cellulitis in this leg in the past and that her occupational therapist for lymphedema saw her yesterday and advised she call pcp for an abx as thought she may have cellulitis -Denies:fevers, chills, malaise, increase swelling, pain in the calf, SOB, has a blister that popped a few days ago - she thinks got the blister from her sock rubbing there - no drainage except from that per her report which has stopped -she wants to take an abx -Pertinent past medical history: see below -Pertinent medication allergies:  Allergies  Allergen Reactions   Niacin Anaphylaxis, Swelling and Other (See Comments)    REACTION: Swelling, problems breathing   Sulfamethoxazole-Trimethoprim Anaphylaxis, Swelling, Rash and Other (See Comments)    REACTION: Rash, throat closed, eyes swelled.   Aspirin Hives   Bactrim Hives   Benzoin Compound Rash   Cephalexin Hives    Tolerated ceftriaxone and cefepime 07/2019   Clindamycin/Lincomycin Hives   Doxycycline Hives    Taken 10/19/2020 no reaction    Iohexol Hives    Pt treated with PO benedryl   Lisinopril Hives   Naproxen Hives   Pnu-Imune [Pneumococcal Polysaccharide Vaccine] Rash   Sulfonamide Derivatives Rash  -reports many of these were listed a long time ago when had to  take many abx at the same time and had a rash - but reports has taken doxy since and has done fine. She said she took it earlier this year and was fine.    ROS: See pertinent positives and negatives per HPI.  Past Medical History:  Diagnosis Date   Allergic rhinitis    Ankle fracture, right    Anxiety    ASCUS of cervix with negative high risk HPV 05/2018   Carpal tunnel syndrome    Cervical cancer ([place]) 1998   Fibromyalgia    Hyperlipidemia    Hypertension    Migraine headache    Obesity    Sleep apnea     Past Surgical History:  Procedure Laterality Date   ABDOMINAL HYSTERECTOMY  10/1996   ABDOMINAL SURGERY  jan/11/2012   gastric bypass surgery   CARPAL TUNNEL RELEASE     bilateral    CESAREAN SECTION     CHOLECYSTECTOMY  07/1992   ESOPHAGOGASTRODUODENOSCOPY N/A 01/14/2016   Procedure: ESOPHAGOGASTRODUODENOSCOPY (EGD);  Surgeon: Mauri Pole, MD;  Location: [facility] ENDOSCOPY;  Service: Endoscopy;  Laterality: N/A;     Current Outpatient Medications:    ALPRAZolam (XANAX) 0.5 MG tablet, TAKE 1 TABLET BY MOUTH 2 TIMES DAILY AS NEEDED FOR ANXIETY, Disp: 30 tablet, Rfl: 0   ARIPiprazole (ABILIFY) 10 MG tablet, Take 10 mg by mouth in the morning. , Disp: , Rfl:    Armodafinil 250 MG tablet, 3 (three) times daily. 8am, 2pm, 6pm, Disp: , Rfl:    Biotin 10 MG TABS, Take 10 mg  by mouth in the morning. , Disp: , Rfl:    buPROPion (WELLBUTRIN XL) 300 MG 24 hr tablet, TAKE 1 TABLET BY MOUTH EVERY DAY, Disp: 30 tablet, Rfl: 2   busPIRone (BUSPAR) 30 MG tablet, TAKE 1 TABLET BY MOUTH 2 TIMES DAILY, Disp: 60 tablet, Rfl: 2   calcium citrate (CALCITRATE - DOSED IN MG ELEMENTAL CALCIUM) 950 MG tablet, Take 1 tablet by mouth daily., Disp: , Rfl:    clobetasol cream (TEMOVATE) [id number] %, Apply 1 application topically 2 (two) times daily. Cover both legs with Colbetasol cream, Disp: 30 g, Rfl: 0   clonazePAM (KLONOPIN) 1 MG tablet, TAKE 1 TABLET BY MOUTH AT BEDTIME, Disp: 30 tablet, Rfl: 3    collagenase (SANTYL) ointment, Apply topically daily. apply Santyl to scabs on legs, Disp: 15 g, Rfl: 0   cyclobenzaprine (FLEXERIL) 10 MG tablet, TAKE 1 TABLET BY MOUTH 3 TIMES DAILY AS NEEDED, Disp: 270 tablet, Rfl: 2   doxycycline (VIBRA-TABS) 100 MG tablet, Take 1 tablet (100 mg total) by mouth 2 (two) times daily., Disp: 10 tablet, Rfl: 0   DULoxetine (CYMBALTA) 60 MG capsule, Take 2 capsules (120 mg total) by mouth daily., Disp: 240 capsule, Rfl: 2   FEROSUL 325 (65 Fe) MG tablet, TAKE 1 TABLET BY MOUTH EVERY DAY WITH BREAKFAST, Disp: 30 tablet, Rfl: 2   fluconazole (DIFLUCAN) 100 MG tablet, Take 1 tablet (100 mg total) by mouth daily., Disp: 7 tablet, Rfl: 0   furosemide (LASIX) 40 MG tablet, TAKE 1 TABLET BY MOUTH EVERY DAY. MAY take additional TABLET IF weight INCREASE by 5lb & call md FOR further instructions (Patient taking differently: Take 20 mg by mouth daily.), Disp: 30 tablet, Rfl: 1   gabapentin (NEURONTIN) 600 MG tablet, Take 1 tablet (600 mg total) by mouth 3 (three) times daily. TAKE 2 TABLETS BY MOUTH 3 TIMES DAILY, Disp: 270 tablet, Rfl: 0   HYDROcodone-acetaminophen (NORCO) 10-325 MG tablet, TAKE 1 TABLET BY MOUTH EVERY 6 HOURS AS NEEDED, Disp: 90 tablet, Rfl: 0   irbesartan (AVAPRO) 75 MG tablet, TAKE 1 TABLET BY MOUTH EVERY DAY, Disp: 30 tablet, Rfl: 0   meclizine (ANTIVERT) 25 MG tablet, Take 1 tablet (25 mg total) by mouth 3 (three) times daily as needed for dizziness., Disp: 30 tablet, Rfl: 0   mupirocin ointment (BACTROBAN) 2 %, APPLY TOPICALLY 2 TIMES DAILY, Disp: 22 g, Rfl: 1   nebivolol (BYSTOLIC) 10 MG tablet, TAKE 1 TABLET BY MOUTH EVERY DAY, Disp: 30 tablet, Rfl: 2   nystatin (MYCOSTATIN/NYSTOP) powder, Apply 1 application topically 3 (three) times daily., Disp: 60 g, Rfl: 3   Oxcarbazepine (TRILEPTAL) 300 MG tablet, Take 300 mg by mouth 2 (two) times daily., Disp: , Rfl:    pantoprazole (PROTONIX) 40 MG tablet, TAKE 1 TABLET BY MOUTH 2 TIMES DAILY, Disp: 60 tablet,  Rfl: 2   pentoxifylline (TRENTAL) 400 MG CR tablet, Take 400 mg by mouth in the morning and at bedtime., Disp: , Rfl: 11   phentermine 37.5 MG capsule, Take 1 capsule (37.5 mg total) by mouth every morning., Disp: 90 capsule, Rfl: 0   promethazine (PHENERGAN) 25 MG tablet, TAKE 1 TABLET BY MOUTH EVERY 6 HOURS AS NEEDED FOR NAUSEA AND VOMITING, Disp: 45 tablet, Rfl: 3   rosuvastatin (CRESTOR) 20 MG tablet, TAKE 1 TABLET BY MOUTH EVERY DAY, Disp: 90 tablet, Rfl: 2   senna-docusate (SENOKOT-S) 8.6-50 MG tablet, Take 1 tablet by mouth at bedtime., Disp: 30 tablet, Rfl: 0  SKYRIZI, 150 MG DOSE, 75 MG/0.83ML PSKT, Inject 150 mg into the skin every 3 (three) months. , Disp: , Rfl:    SUMAtriptan (IMITREX) 100 MG tablet, TAKE 1 TABLET BY MOUTH AT migraine ONSET, MAY REPEAT in 2 hours IF HEADACHE persists OR recurs, Disp: 10 tablet, Rfl: 1  EXAM:  VITALS per patient if applicable:  GENERAL: alert, oriented, appears well and in no acute distress  HEENT: atraumatic, conjunttiva clear, no obvious abnormalities on inspection of external nose and ears  NECK: normal movements of the head and neck  LUNGS: on inspection no signs of respiratory distress, breathing rate appears normal, no obvious gross SOB, gasping or wheezing  CV: no obvious cyanosis  Skin: extensive chronic venous statis dermatitis changes to both LEs, area of increased erythema R ant shin  MS: moves all visible extremities without noticeable abnormality  PSYCH/NEURO: pleasant and cooperative, no obvious depression or anxiety, speech and thought processing grossly intact  ASSESSMENT AND PLAN:  Discussed the following assessment and plan:  Burning sensation of skin  Erythema of skin  -we discussed possible serious and likely etiologies, options for evaluation and workup, limitations of telemedicine visit vs in person visit, treatment, treatment risks and precautions. Pt is agreeable to treatment via telemedicine at this moment.  Query cellulitis vs other. Advised inperson evaluation but she wants to try and abx. She has an extensive list of abx allergies but reports she has taken doxy without any issues - including recently this year. She agrees to seek prompt inperson care if any worsening, new symptoms, reaction, fevers, chills, spreading redness or rash, etc., or if not improving with treatment.  Scheduled follow up with PCP offered: advised follow up inperson next week with her PCP office - she agrees to call to schedule a follow up after this virtual visit.  Advised to seek prompt in person care if worsening, new symptoms arise, or if is not improving with treatment. Discussed options for inperson care if PCP office not available. Did let this patient know that I only do telemedicine on Tuesdays and Thursdays for [facility]. Advised to schedule follow up visit with PCP or [facility] if any further questions or concerns to avoid delays in care.   I discussed the assessment and treatment plan with the patient. The patient was provided an opportunity to ask questions and all were answered. The patient agreed with the plan and demonstrated an understanding of the instructions.     Lucretia Kern, DO

## 2021-01-25 DIAGNOSIS — L52 Erythema nodosum: Principal | ICD-10-CM

## 2021-01-25 DIAGNOSIS — M79604 Pain in right leg: Principal | ICD-10-CM

## 2021-01-25 DIAGNOSIS — R2689 Other abnormalities of gait and mobility: Principal | ICD-10-CM

## 2021-01-25 DIAGNOSIS — M79605 Pain in left leg: Principal | ICD-10-CM

## 2021-01-25 DIAGNOSIS — L409 Psoriasis, unspecified: Principal | ICD-10-CM

## 2021-01-25 DIAGNOSIS — I890 Lymphedema, not elsewhere classified: Principal | ICD-10-CM

## 2021-01-25 DIAGNOSIS — Z9181 History of falling: Secondary | ICD-10-CM | POA: Diagnosis not present

## 2021-01-25 NOTE — Unmapped (Signed)
[facility] OCCUPATIONAL THERAPY [place]  OUTPATIENT OCCUPATIONAL THERAPY  01/25/2021  Note Type: Treatment Note       Patient Name: Autumn Mcdonald  Date of Birth:1970/11/16  Diagnosis:   Encounter Diagnosis   Name Primary?   ??? Lymphedema Yes     Visit 8    Referring MD:  Ethlyn Gallery     Date OT Care Plan Established or Reviewed-10/19/20    Plan of Care Effective Date: 01/14/2021 - 02/13/2021    Assessment/Plan:    Assessment details:       Pt tolerated all treatment methods well with no adverse reaction to therapy. Pt's BLE skin integrity was poor on this date as skin was noted to be deep pink with significant weeping. On this date BLEs were noted to be less warm and tender to the touch by the conclusion of the session. No open wounds noted just drainage. Pt did have moderately dense tissue that softened with MLD. This will be concentration moving forward along with skin integrity.       Pt is a 50 year old female with a medical and treatment dx of lymphedema. Pt has been seen for 6 outpatient occupational therapy treatment sessions with good tolerance for all treatment methods. She has made good progress in goals but recently had a set back with additional weeping in BLE that we are now addressing. She does not have significant wounds, just weeping. Compression bandaging is assisting with resolving this. Please see goals for updates.  Pt presents with body system impairments including edema, skin integrity that impair her general health, ambulation, ADL, and IADL functions. She would benefit from continued outpatient occupational therapy to address lymphedema in order to reach highest level of function.   Impairments: fall risk, increased edema, uncontrolled swelling, impaired ADLs, decreased skin integrity and decreased mobility  ??  Pt also demonstrates a need for physical therapy due to high risk of falls, poor balance and decreased mobility.   Impairments: fall risk, impaired balance, increased edema, pain, decreased endurance, decreased mobility, impaired ADLs and decreased skin integrity    Personal Factors/Comorbidities: 3    Examination of Body Systems: 3 elements    Clinical Presentation: evolving  Clinical Decision Making: moderate  Prognosis: good prognosis  Positive Prognosis Rationale: age and motivated for treatment.  Negative Prognosis Rationale: insight, chronicity of condition, severity of symptoms, body habitus and balance.    Therapy Goals  Goals:       Goals:      Short Term Goals:  1. Pt's circumference of BLE will be reduced by 1cm at all measurements within  2 weeks at feet and knees.   CURRENT STATUS: Progress made.  Continue goal for mastery.    2. Pt's skin integrity of BLE will improve at ankles in 2 weeks to decrease  potential for infection.  CURRENT STATUS: BLE now weeping. Continue for mastery.     3. Pt will be educated on self MLD and recall it with 100% accuracy to perform  in the home in 2 weeks.  CURRENT STATUS: Pt has been given information. Goal in progress. Continue for mastery.  4. Pt's skin texture of BLE will improve to a soft, supple state within 2  Weeks.  CURRENT STATUS: Slow improvement. Continue for mastery.    Long Term Goals:  1. Pt's circumference of bilateral lower extremities will be reduced by 2cm at  all appropriate measurements within 4 weeks to increase ADL function.  CURRENT STATUS: Progression. Continue  for mastery.  2. Pt will recall all precautions to prevent lymphedema exacerbation at 100% in  4 weeks.  CURRENT STATUS: GOAL MET  3. Pt will be fitted for compression garments to address BLE edema with  independent donning in 4 weeks.  CURRENT STATUS: Pt has not yet been fitted for garments. She will need them for her upper BLE.   4. Pt will be independent with home management of lymphedema in 4 weeks. Continue for mastery.   CURRENT STATUS: Pt does not yet have the tools for independent management of lymphedema. Continue for mastery.   Plan  Therapy options: will be seen for skilled occupational therapy services  Planned therapy interventions: Compression Bandaging, Compression Pump, Education - Patient, Home Exercise Program, Garment Measurement, Therapeutic Exercises, Therapeutic Activities, Manual Therapy and Manual Lymph Drainage  DME Equipment: compression pump, lymphedema bandages and compression garments.  Frequency: 2x week  Duration in weeks: 4 weeks  Education provided to: patient.  Education provided: Importance of Therapy, HEP, Lymphedema precautions and Manual lymph drainage  Education results: needs reinforcement and needs further instruction.      Total Session Time: 55  Treatment rendered today:         Pt then lay in supine with HOB elevated for comfort. Therapist completed manual lymph drainage pre-treatment to bilateral axilla and then full MLD to BLE. Therapist placed size F stockinette on BLE with 2 rolls of coban on each leg to maintain results of MLD. Therapist instructed pt on management of weeping and cleaning.         Subjective:   History of Present Illness  Date of Onset: 05/17/2003    Date of Evaluation: 10/19/2020    Reason for Referral/Chief Complaint:       Pt reports that her legs have been swelling since 2005. She was sent to rumatologist in 2014/2015 with an appropriate dx then and it has progressively gotten worse. She has also been getting treatment for psoriasis. Pt has been seen at wound center in [place] for the past 17 weeks. She has not been released by them yet. Pt desires to wear appropriate shoes and to have a better life. She is seeking outpatient lymphedema treatment to address BLE lymphedema.   Subjective:     Pt reports pain in LLE primarily 3/10 with stinging quality at rest and with activity. She reports that she initiated antibiotic for suspected cellulitis on Saturday.  Pt also has her PT evaluation scheduled for next week.   Quality of life: fair    Pain  Current pain rating: 3  At best pain rating: 0  At worst pain rating: 10  Location: BLE  Quality: needle-like and hot  Relieving factors: elevation  Aggravating factors: walking  Progression: worsening  Red flags: night pain and disturbed sleep.  Current functional status: limited walking tolerance, unsteady gait, disturbed sleep, leisure activities, limited standing tolerance, use of assistive device, limited recreation, limited household activities and limited exercise    Precautions and Equipment  Precautions: Fall risk and Cancer history  Equipment Currently Used: Metallurgist, Training and development officer  Social Support  Lives in: two story house  Lives with: family members  Hand dominance: right  Communication Preference: verbal  Barriers to Learning: No Barriers  Work/School: on disability    Diagnostic Tests    Diagnostic Test Comments:       none    Treatments  No previous or current treatments      Patient Goals  Patient goals for therapy: decrease assistance with ADLs, decreased edema, decreased pain, improve fit of clothing, improve fit of shoes, improve independence with dressing, improved ambulation, improved sleep, independence with ADLs/IADLs, obtain compression garment and return to sport/leisure activities  Patient goal: obatin pneumatic pump      Objective:   Lymphedema:   Treatment:     Lymph node removal: No      Chemotherapy: No      Radiation therapy: No      Previous lymphedema treatment: No      Reoccurrence?: No    Current Management:     Current management details:  CDT, compression placed by therapist    Daytime:  Performs exercise    Nighttime:  Nothing  Contraindications:     Contraindications:  None  Objective:     Location of swelling:  Right, left, ankle, toes, knee, thigh, groin and foot    Fold thickness:  Moderate    Fibrosis: No      Skin assessment:  Discoloration, edema and dry skin    Skin assessment comments:  Skin is noted to be dark pink. Weeping noted    Incision:  N/A    Color:  Pink    Temperature:  Normal    Hair growth:  Reduced Stemmer's sign:  +    Wound: No    Posture/Observations:     Sitting:  Rounded shoulders    Standing:  Forward flexed posture and wide BOS    Sleeping:  Recliner  ROM and Strength:     ROM and Strength findings:  BLE WFL  Tests:     Lower Extremity Functional Scale Score (out of 80):  2  Lymphedema additional information:  Measurements:    RLE:                    12/15/20:         01/14/21:  MTP: 24.5cm        25cm         24cm  Heel: 37cm           36cm          35.5cm  Ankle: 31cm         33.1cm        31.2cm  10cm: 33cm         43.8cm        42.2cm  20cm: 49cm         57.5cm        48.5cm  Knee: 56.3cm       59.5cm        56.5cm  10cm: 84.2cm??      87cm          86.6cm  20cm: 92.8cm       97.5cm       95.5cm     LLE:  MTP: 25.6cm        24.5cm       24cm  Heel: 37.2cm        35.8cm        35.4cm  Ankle: 34cm          33cm          33cm  10cm: 36.3cm       41.5cm        41cm  20cm: 54.5cm       58.5cm       54.1cm  Knee: 69cm  73cm           68.5cm  10cm: 90.2cm       96cm           90.1cm  20cm: 100cm        99.9cm        98cm                               I attest that I have reviewed the above information.  Signed: Melvern Banker, OT  01/25/2021 4:10 PM

## 2021-01-27 ENCOUNTER — Other Ambulatory Visit: Payer: Self-pay | Admitting: Family

## 2021-01-28 ENCOUNTER — Encounter: Payer: Self-pay | Admitting: Family Medicine

## 2021-01-28 DIAGNOSIS — R2689 Other abnormalities of gait and mobility: Principal | ICD-10-CM

## 2021-01-28 DIAGNOSIS — M79605 Pain in left leg: Principal | ICD-10-CM

## 2021-01-28 DIAGNOSIS — M79604 Pain in right leg: Principal | ICD-10-CM

## 2021-01-28 DIAGNOSIS — L409 Psoriasis, unspecified: Principal | ICD-10-CM

## 2021-01-28 DIAGNOSIS — L52 Erythema nodosum: Principal | ICD-10-CM

## 2021-01-28 DIAGNOSIS — I890 Lymphedema, not elsewhere classified: Principal | ICD-10-CM

## 2021-01-28 DIAGNOSIS — Z9181 History of falling: Secondary | ICD-10-CM | POA: Diagnosis not present

## 2021-01-28 NOTE — Unmapped (Signed)
[facility] OCCUPATIONAL THERAPY [place]  OUTPATIENT OCCUPATIONAL THERAPY  01/28/2021  Note Type: Treatment Note       Patient Name: Autumn Mcdonald  Date of Birth:[birth year]  Diagnosis:   Encounter Diagnosis   Name Primary?   ??? Lymphedema Yes     Visit 9    Referring MD:  Ethlyn Gallery     Date OT Care Plan Established or Reviewed-10/19/20    Plan of Care Effective Date: 01/14/2021 - 02/13/2021    Assessment/Plan:    Assessment details:       Pt tolerated all treatment methods well with no adverse reaction to therapy. Pt's BLE skin integrity was poor+ on this date as skin was noted to be less pink with decreased weeping. On this date BLEs were noted to have no warmth and be less tender to the touch. Minimal wounds noted bilaterally along with weeping.  Pt did have minimally dense tissue that softened with MLD. This will be concentration moving forward along with skin integrity.       Pt is a 50 year old female with a medical and treatment dx of lymphedema. Pt has been seen for 6 outpatient occupational therapy treatment sessions with good tolerance for all treatment methods. She has made good progress in goals but recently had a set back with additional weeping in BLE that we are now addressing. She does not have significant wounds, just weeping. Compression bandaging is assisting with resolving this. Please see goals for updates.  Pt presents with body system impairments including edema, skin integrity that impair her general health, ambulation, ADL, and IADL functions. She would benefit from continued outpatient occupational therapy to address lymphedema in order to reach highest level of function.   Impairments: fall risk, increased edema, uncontrolled swelling, impaired ADLs, decreased skin integrity and decreased mobility  ??  Pt also demonstrates a need for physical therapy due to high risk of falls, poor balance and decreased mobility.   Impairments: fall risk, impaired balance, increased edema, pain, decreased endurance, decreased mobility, impaired ADLs and decreased skin integrity    Personal Factors/Comorbidities: 3    Examination of Body Systems: 3 elements    Clinical Presentation: evolving  Clinical Decision Making: moderate  Prognosis: good prognosis  Positive Prognosis Rationale: age and motivated for treatment.  Negative Prognosis Rationale: insight, chronicity of condition, severity of symptoms, body habitus and balance.    Therapy Goals  Goals:       Goals:      Short Term Goals:  1. Pt's circumference of BLE will be reduced by 1cm at all measurements within  2 weeks at feet and knees.   CURRENT STATUS: Progress made.  Continue goal for mastery.    2. Pt's skin integrity of BLE will improve at ankles in 2 weeks to decrease  potential for infection.  CURRENT STATUS: BLE now weeping. Continue for mastery.     3. Pt will be educated on self MLD and recall it with 100% accuracy to perform  in the home in 2 weeks.  CURRENT STATUS: Pt has been given information. Goal in progress. Continue for mastery.  4. Pt's skin texture of BLE will improve to a soft, supple state within 2  Weeks.  CURRENT STATUS: Slow improvement. Continue for mastery.    Long Term Goals:  1. Pt's circumference of bilateral lower extremities will be reduced by 2cm at  all appropriate measurements within 4 weeks to increase ADL function.  CURRENT STATUS: Progression. Continue for mastery.  2. Pt will recall all precautions to prevent lymphedema exacerbation at 100% in  4 weeks.  CURRENT STATUS: GOAL MET  3. Pt will be fitted for compression garments to address BLE edema with  independent donning in 4 weeks.  CURRENT STATUS: Pt has not yet been fitted for garments. She will need them for her upper BLE.   4. Pt will be independent with home management of lymphedema in 4 weeks. Continue for mastery.   CURRENT STATUS: Pt does not yet have the tools for independent management of lymphedema. Continue for mastery.   Plan  Therapy options: will be seen for skilled occupational therapy services  Planned therapy interventions: Compression Bandaging, Compression Pump, Education - Patient, Home Exercise Program, Garment Measurement, Therapeutic Exercises, Therapeutic Activities, Manual Therapy and Manual Lymph Drainage  DME Equipment: compression pump, lymphedema bandages and compression garments.  Frequency: 2x week  Duration in weeks: 4 weeks  Education provided to: patient.  Education provided: Importance of Therapy, HEP, Lymphedema precautions and Manual lymph drainage  Education results: needs reinforcement and needs further instruction.      Total Session Time: 55  Treatment rendered today:         Pt then lay in supine with HOB elevated for comfort. Therapist completed manual lymph drainage pre-treatment to bilateral axilla and then full MLD to BLE. Therapist placed size F stockinette on BLE with 2 rolls of coban on each leg to maintain results of MLD. Therapist instructed pt on management of weeping and cleaning.         Subjective:   History of Present Illness  Date of Onset: 05/17/2003    Date of Evaluation: 10/19/2020    Reason for Referral/Chief Complaint:       Pt reports that her legs have been swelling since 2005. She was sent to rumatologist in 2014/2015 with an appropriate dx then and it has progressively gotten worse. She has also been getting treatment for psoriasis. Pt has been seen at wound center in [place] for the past 17 weeks. She has not been released by them yet. Pt desires to wear appropriate shoes and to have a better life. She is seeking outpatient lymphedema treatment to address BLE lymphedema.   Subjective:     Pt reports that her legs look and feel much better. She reports pain 3/10 in BLE with a stinging quality.   Quality of life: fair    Pain  Current pain rating: 3  At best pain rating: 0  At worst pain rating: 10  Location: BLE  Quality: needle-like and hot  Relieving factors: elevation  Aggravating factors: walking  Progression: worsening  Red flags: night pain and disturbed sleep.  Current functional status: limited walking tolerance, unsteady gait, disturbed sleep, leisure activities, limited standing tolerance, use of assistive device, limited recreation, limited household activities and limited exercise    Precautions and Equipment  Precautions: Fall risk and Cancer history  Equipment Currently Used: Metallurgist, Training and development officer  Social Support  Lives in: two story house  Lives with: family members  Hand dominance: right  Communication Preference: verbal  Barriers to Learning: No Barriers  Work/School: on disability    Diagnostic Tests    Diagnostic Test Comments:       none    Treatments  No previous or current treatments      Patient Goals  Patient goals for therapy: decrease assistance with ADLs, decreased edema, decreased pain, improve fit of clothing, improve fit of shoes,  improve independence with dressing, improved ambulation, improved sleep, independence with ADLs/IADLs, obtain compression garment and return to sport/leisure activities  Patient goal: obatin pneumatic pump      Objective:   Lymphedema:   Treatment:     Lymph node removal: No      Chemotherapy: No      Radiation therapy: No      Previous lymphedema treatment: No      Reoccurrence?: No    Current Management:     Current management details:  CDT, compression placed by therapist    Daytime:  Performs exercise    Nighttime:  Nothing  Contraindications:     Contraindications:  None  Objective:     Location of swelling:  Right, left, ankle, toes, knee, thigh, groin and foot    Fold thickness:  Moderate    Fibrosis: No      Skin assessment:  Discoloration, edema and dry skin    Skin assessment comments:  Skin is noted to be dark pink. Weeping noted    Incision:  N/A    Color:  Pink    Temperature:  Normal    Hair growth:  Reduced    Stemmer's sign:  +    Wound: No    Posture/Observations:     Sitting:  Rounded shoulders    Standing:  Forward flexed posture and wide BOS    Sleeping:  Recliner  ROM and Strength:     ROM and Strength findings:  BLE WFL  Tests:     Lower Extremity Functional Scale Score (out of 80):  2  Lymphedema additional information:  Measurements:    RLE:                    12/15/20:         01/14/21:  MTP: 24.5cm        25cm         24cm  Heel: 37cm           36cm          35.5cm  Ankle: 31cm         33.1cm        31.2cm  10cm: 33cm         43.8cm        42.2cm  20cm: 49cm         57.5cm        48.5cm  Knee: 56.3cm       59.5cm        56.5cm  10cm: 84.2cm??      87cm          86.6cm  20cm: 92.8cm       97.5cm       95.5cm     LLE:  MTP: 25.6cm        24.5cm       24cm  Heel: 37.2cm        35.8cm        35.4cm  Ankle: 34cm          33cm          33cm  10cm: 36.3cm       41.5cm        41cm  20cm: 54.5cm       58.5cm       54.1cm  Knee: 69cm           73cm           68.5cm  10cm: 90.2cm  96cm           90.1cm  20cm: 100cm        99.9cm        98cm                               I attest that I have reviewed the above information.  Signed: Melvern Banker, OT  01/28/2021 12:47 PM

## 2021-01-29 MED ORDER — HYDROCODONE-ACETAMINOPHEN 10-325 MG PO TABS: 1.0000 | ORAL_TABLET | Freq: Four times a day (QID) | ORAL | 0 refills | Status: DC | PRN

## 2021-02-01 ENCOUNTER — Encounter: Payer: Self-pay | Admitting: Family Medicine

## 2021-02-01 ENCOUNTER — Other Ambulatory Visit: Payer: Self-pay

## 2021-02-01 ENCOUNTER — Telehealth (INDEPENDENT_AMBULATORY_CARE_PROVIDER_SITE_OTHER): Payer: Medicare Other | Admitting: Family Medicine

## 2021-02-01 VITALS — Wt 302.0 lb

## 2021-02-01 DIAGNOSIS — L03119 Cellulitis of unspecified part of limb: Secondary | ICD-10-CM | POA: Diagnosis not present

## 2021-02-01 MED ORDER — DOXYCYCLINE HYCLATE 100 MG PO TABS: 100.0000 mg | ORAL_TABLET | Freq: Two times a day (BID) | ORAL | 0 refills | Status: DC

## 2021-02-01 NOTE — Unmapped (Signed)
Missed Appointment:    Pt called to cancel appointment secondary to her legs being sore. She rescheduled to tomorrow after therapist called her back per her request.     Jeanella Flattery, OTR/L, CLT

## 2021-02-01 NOTE — Progress Notes (Signed)
Virtual Visit via Video   I connected with patient on 02/01/21 at  9:30 AM EDT by a video enabled telemedicine application and verified that I am speaking with the correct person using two identifiers.  Location patient: Home Location provider: [facility] Persons participating in the virtual visit: Patient, Provider, Register Claiborne Billings C)  I discussed the limitations of evaluation and management by telemedicine and the availability of in person appointments. The patient expressed understanding and agreed to proceed.  Subjective:   HPI:   Cellulitis- pt has hx of recurrent infxns due to venous stasis dermatitis/lymphedema.  Was treated w/ 5 days of Doxy on 9/8.  She reports legs are not as painful as previous infections.  Continues to have redness and multiple open wounds.  No fevers/chills.    ROS:   See pertinent positives and negatives per HPI.  Patient Active Problem List   Diagnosis Date Noted   Hyponatremia 07/27/2019   Urinary urgency 04/04/2018   Chronic narcotic use 09/07/2016   Acute blood loss anemia 01/14/2016   Multiple gastric ulcers    PAN (polyarteritis nodosa) ([place]) 11/24/2015   GERD (gastroesophageal reflux disease) 01/08/2014   Routine general medical examination at a health care facility 04/23/2013   Bariatric surgery status 10/31/2012   S/P skin biopsy 10/31/2012   Psoriasis 01/09/2012   DDD (degenerative disc disease), lumbar 11/30/2011   Migraine headache    [name] (obstructive sleep apnea) 11/16/2010   HYPERGLYCEMIA, FASTING 10/08/2009   CONTRACTURE OF TENDON 08/17/2009   PARESTHESIA, HANDS 12/23/2008   Leg pain, right 11/05/2008   ADVERSE DRUG REACTION 04/03/2008   PERIPHERAL EDEMA 03/26/2008   Morbid obesity ([place]) 01/03/2007   Cellulitis 01/03/2007   Hyperlipidemia 09/22/2006   Fibromyalgia 09/20/2006   Anxiety and depression 06/28/2006    Social History   Tobacco Use   Smoking status: Never   Smokeless tobacco: Never  Substance  Use Topics   Alcohol use: No    Current Outpatient Medications:    ALPRAZolam (XANAX) 0.5 MG tablet, TAKE 1 TABLET BY MOUTH 2 TIMES DAILY AS NEEDED FOR ANXIETY, Disp: 30 tablet, Rfl: 0   ARIPiprazole (ABILIFY) 10 MG tablet, Take 10 mg by mouth in the morning. , Disp: , Rfl:    Armodafinil 250 MG tablet, 3 (three) times daily. 8am, 2pm, 6pm, Disp: , Rfl:    Biotin 10 MG TABS, Take 10 mg by mouth in the morning. , Disp: , Rfl:    buPROPion (WELLBUTRIN XL) 300 MG 24 hr tablet, TAKE 1 TABLET BY MOUTH EVERY DAY, Disp: 30 tablet, Rfl: 2   busPIRone (BUSPAR) 30 MG tablet, TAKE 1 TABLET BY MOUTH 2 TIMES DAILY, Disp: 60 tablet, Rfl: 2   calcium citrate (CALCITRATE - DOSED IN MG ELEMENTAL CALCIUM) 950 MG tablet, Take 1 tablet by mouth daily., Disp: , Rfl:    clobetasol cream (TEMOVATE) [id number] %, Apply 1 application topically 2 (two) times daily. Cover both legs with Colbetasol cream, Disp: 30 g, Rfl: 0   clonazePAM (KLONOPIN) 1 MG tablet, TAKE 1 TABLET BY MOUTH AT BEDTIME, Disp: 30 tablet, Rfl: 3   collagenase (SANTYL) ointment, Apply topically daily. apply Santyl to scabs on legs, Disp: 15 g, Rfl: 0   cyclobenzaprine (FLEXERIL) 10 MG tablet, TAKE 1 TABLET BY MOUTH 3 TIMES DAILY AS NEEDED, Disp: 270 tablet, Rfl: 2   doxycycline (VIBRA-TABS) 100 MG tablet, Take 1 tablet (100 mg total) by mouth 2 (two) times daily., Disp: 10 tablet, Rfl: 0   DULoxetine (  CYMBALTA) 60 MG capsule, Take 2 capsules (120 mg total) by mouth daily., Disp: 240 capsule, Rfl: 2   FEROSUL 325 (65 Fe) MG tablet, TAKE 1 TABLET BY MOUTH EVERY DAY WITH BREAKFAST, Disp: 30 tablet, Rfl: 2   fluconazole (DIFLUCAN) 100 MG tablet, Take 1 tablet (100 mg total) by mouth daily., Disp: 7 tablet, Rfl: 0   furosemide (LASIX) 40 MG tablet, TAKE 1 TABLET BY MOUTH EVERY DAY. MAY take additional TABLET IF weight INCREASE by 5lb & call md FOR further instructions (Patient taking differently: Take 20 mg by mouth daily.), Disp: 30 tablet, Rfl: 1   gabapentin  (NEURONTIN) 600 MG tablet, Take 1 tablet (600 mg total) by mouth 3 (three) times daily. TAKE 2 TABLETS BY MOUTH 3 TIMES DAILY, Disp: 270 tablet, Rfl: 0   HYDROcodone-acetaminophen (NORCO) 10-325 MG tablet, Take 1 tablet by mouth every 6 (six) hours as needed., Disp: 90 tablet, Rfl: 0   irbesartan (AVAPRO) 75 MG tablet, TAKE 1 TABLET BY MOUTH EVERY DAY, Disp: 30 tablet, Rfl: 0   meclizine (ANTIVERT) 25 MG tablet, Take 1 tablet (25 mg total) by mouth 3 (three) times daily as needed for dizziness., Disp: 30 tablet, Rfl: 0   mupirocin ointment (BACTROBAN) 2 %, APPLY TOPICALLY 2 TIMES DAILY, Disp: 22 g, Rfl: 1   nebivolol (BYSTOLIC) 10 MG tablet, TAKE 1 TABLET BY MOUTH EVERY DAY, Disp: 30 tablet, Rfl: 2   nystatin (MYCOSTATIN/NYSTOP) powder, Apply 1 application topically 3 (three) times daily., Disp: 60 g, Rfl: 3   Oxcarbazepine (TRILEPTAL) 300 MG tablet, Take 300 mg by mouth 2 (two) times daily., Disp: , Rfl:    pantoprazole (PROTONIX) 40 MG tablet, TAKE 1 TABLET BY MOUTH 2 TIMES DAILY, Disp: 60 tablet, Rfl: 2   pentoxifylline (TRENTAL) 400 MG CR tablet, Take 400 mg by mouth in the morning and at bedtime., Disp: , Rfl: 11   phentermine 37.5 MG capsule, Take 1 capsule (37.5 mg total) by mouth every morning., Disp: 90 capsule, Rfl: 0   promethazine (PHENERGAN) 25 MG tablet, TAKE 1 TABLET BY MOUTH EVERY 6 HOURS AS NEEDED FOR NAUSEA AND VOMITING, Disp: 45 tablet, Rfl: 3   rosuvastatin (CRESTOR) 20 MG tablet, TAKE 1 TABLET BY MOUTH EVERY DAY, Disp: 90 tablet, Rfl: 2   senna-docusate (SENOKOT-S) 8.6-50 MG tablet, Take 1 tablet by mouth at bedtime., Disp: 30 tablet, Rfl: 0   SKYRIZI, 150 MG DOSE, 75 MG/0.83ML PSKT, Inject 150 mg into the skin every 3 (three) months. , Disp: , Rfl:    SUMAtriptan (IMITREX) 100 MG tablet, TAKE 1 TABLET BY MOUTH AT migraine ONSET, MAY REPEAT in 2 hours IF HEADACHE persists OR recurs, Disp: 10 tablet, Rfl: 1  Allergies  Allergen Reactions   Niacin Anaphylaxis, Swelling and Other  (See Comments)    REACTION: Swelling, problems breathing   Sulfamethoxazole-Trimethoprim Anaphylaxis, Swelling, Rash and Other (See Comments)    REACTION: Rash, throat closed, eyes swelled.   Aspirin Hives   Bactrim Hives   Benzoin Compound Rash   Cephalexin Hives    Tolerated ceftriaxone and cefepime 07/2019   Clindamycin/Lincomycin Hives   Doxycycline Hives    Taken 10/19/2020 no reaction    Iohexol Hives    Pt treated with PO benedryl   Lisinopril Hives   Naproxen Hives   Pnu-Imune [Pneumococcal Polysaccharide Vaccine] Rash   Sulfonamide Derivatives Rash    Objective:   Wt (!) 302 lb (137 kg) Comment: pt reported  BMI 55.24 kg/m  AAOx3, NAD NCAT,  EOMI No obvious CN deficits Coloring WNL Pt is able to speak clearly, coherently without shortness of breath or increased work of breathing.  Thought process is linear.  Mood is appropriate.   Assessment and Plan:   Recurrent cellulitis- ongoing issue for pt.  Currently infection is improved but this is an ongoing/recurring battle for her.  Will provide 7 day course of Doxy to have on hand should this recur.  Pt expressed understanding and is in agreement w/ plan.    [name], MD 02/01/2021

## 2021-02-03 DIAGNOSIS — R262 Difficulty in walking, not elsewhere classified: Principal | ICD-10-CM

## 2021-02-03 DIAGNOSIS — L409 Psoriasis, unspecified: Principal | ICD-10-CM

## 2021-02-03 DIAGNOSIS — L52 Erythema nodosum: Principal | ICD-10-CM

## 2021-02-03 DIAGNOSIS — I890 Lymphedema, not elsewhere classified: Principal | ICD-10-CM

## 2021-02-03 DIAGNOSIS — R2689 Other abnormalities of gait and mobility: Principal | ICD-10-CM

## 2021-02-03 DIAGNOSIS — Z9181 History of falling: Principal | ICD-10-CM

## 2021-02-03 DIAGNOSIS — M79605 Pain in left leg: Principal | ICD-10-CM

## 2021-02-03 DIAGNOSIS — M79604 Pain in right leg: Principal | ICD-10-CM

## 2021-02-03 DIAGNOSIS — R29898 Other symptoms and signs involving the musculoskeletal system: Principal | ICD-10-CM

## 2021-02-03 NOTE — Unmapped (Signed)
[facility] PHYSICAL THERAPY [place]  OUTPATIENT PHYSICAL THERAPY  02/03/2021  Note Type: Evaluation       Patient Name: Autumn Mcdonald  Date of Birth:[birth year]  Diagnosis:   Encounter Diagnoses   Name Primary?   ??? Lymphedema    ??? Leg weakness, bilateral Yes   ??? Difficulty walking    ??? History of falling      Referring MD:  Ethlyn Gallery,*     Date of Onset of Impairment-No date available  Date PT Care Plan Established or Reviewed-02/03/2021  Date PT Treatment Started-02/03/2021  Plan of Care Effective Date: 02/03/2021-04/05/2021  Visit Number: 1/16    Assessment/Plan:    Assessment details:       Ms. Nevitt is a pleasant 50 year old female who presents upon referral to PT from Dr. Jake Samples with a medical diagnosis of lymphedema and concerns due to LE weakness and difficulty walking. Patient reports legs have been swelling since 2005 and officially received medical diagnosis in 2016.  Reports significant limitations with walking, standing, stairs, overall weakness and fatigue. Goals for PT include improving quality of life, pain (currently 10/10 and overall mobility).   Patient demonstrates significant LE MMT strength deficits (3- to 4/5) across all muscle groups, abdominal weakness and poor posture in seated/standing positions. Patient performs in 30 seconds using quad cane with significantly decreased gait speed, step-length and poor overall mechanics observed increasing risk for falls. Patient unable to complete 5 times sit to stand assessment secondary to increased difficulty transferring in/out of chair today. Patient demonstrates overall limitations with strength, balance, endurance, range of motion and gait further impacting independent functional mobility and ability to perform ADL's. Discussed benefits of using FWW as needed with patient to further assist with balance due to increased risk of falls. Patient verbalized understanding but needs reinforcement. Educated patient on benefits of proper footwear to further provide support and improve overall biomechanics, efficiency and energy conservation. PT/OT recommend patient follow up with PCP regarding cellulitis RLE and secondary to increased pain. Also provided HEP to initiate strengthening.      Impairments indicating medical necessity and functional limitations include: decreased ROM, strength, impaired gait, mobility and pain. Iimpairments limit patient's ability to participate in normal ADL's, daily household activities. Patient will benefit from skilled PT intervention to address current body structure impairments and activity limitations.   Impairments: core weakness, decreased endurance, decreased mobility, gait deviation, impaired ADLs, impaired bed mobility, postural weakness, pain, obesity, joint restriction, increased edema, impaired transfers, impaired flexibility, impaired balance, fall risk, decreased strength and decreased range of motion    Personal Factors/Comorbidities: 3  Specific Comorbidities: PMH, current medical condition, obesity   Examination of Body Systems: 1-2 elements  Body System: Musculoskeletal, integumentary  Clinical Presentation: stable  Clinical Decision Making: moderate  Prognosis: fair prognosis  Positive Prognosis Rationale: age.  Negative Prognosis Rationale: Pain Status, medical status/condition, severity of symptoms and balance.  Barriers to therapy: Medical condition, compliance, body habitus  Therapy Goals  Goals:      Short term goals (to be achieved in 4 weeks) Patient will:  1.Be independent and consistent with home program for self-management of symptoms.   2. Improve BLE pain to <5/10 for improved mobility and completion of ADL's.   3. Improve BLE gross strength to MMT 4/5 across all muscle groups to decrease pain and improve functional mobility.  4. Improve standing tolerance > 30 min with no greater than 3/10 pain.   5. Improved static standing balance to  Good and > 10 min. for decreased falls risk.  6. Initiate and complete 5 times sit to stand assessment using LRAD.    Long term goals (to be achieved in 8 weeks) Patient will:  1. Initiate and complete using LRAD.  2. Improve BLE pain to <2/10 for improved mobility and completion of ADL's.   3. Improve walking tolerance >20 min with no greater than 3/10 pain.   3. Improve 10 meter gait speed by 2 seconds using LRAD.  4. Improve standing dynamic balance to Good + >5 min.   5. Improve 5 times sit to stand time by 5 seconds from baseline using LRAD.                 Plan  Therapy options: will be seen for skilled physical therapy services  Planned therapy interventions: Home Exercise Program, Gait Training, Functional Mobility, Endurance Activites, Education - Patient, Cryotherapy, Manual Therapy, TENS, Therapeutic Activities, Therapeutic Exercises and Transfer Training  DME Equipment: quad cane (Recommend 2ww as needed to assist with balance and mobility).  Frequency: 2x week  Duration in weeks: 8  Education provided to: patient.  Education provided: Surveyor, minerals, DME education, HEP, Treatment options and plan, Posture, Importance of Therapy, Fall prevention and Body awareness  Education results: needs reinforcement.  Communication/Consultation: Initial note sent to Referring Provider.  Next visit plan:       *Pt to perform generalized strengthening program within pain tolerance  Total Session Time: 45  Treatment rendered today:       Therapeutic exercises:  -Seated AROM hip flexion through available range x 10 reps B; significantly limited LLE more than RLE  -Seated LAQ through available range x 10 reps B  -Seated heel-toe raises x 20 reps B  -Seated scapular retraction x 15 reps; 3 sec isometric hold; verbal/physical cues for improved mechanics  -Seated hip adduction using small ball between knees; 3-5 sec hold x 20 reps  -Seated hip abduction with therapist providing manual resistance for isometric contraction; 3 sec hold x 20 reps  -Pt education regarding benefits of proper footwear, importance of daily physical activity and participation in physical therapy intervention  -Pt education regarding importance of use assistive device with all mobility including transitioning to 2ww during periods of increased pain to decrease risk of falls.  Plan details:      Modalities (heat, ice, ultrasound, electrical stimulation, iontophoresis), as needed for pain management. Massage, mobilization and manual therapy as needed to increase ROM in restricted soft tissues and joints. Therapeutic exercise, Neuromuscular Re-education, and education.        Subjective:   History of Present Illness      Date of Evaluation: 02/03/2021    Reason for Referral/Chief Complaint:       Ms. Batra is a pleasant 50 year old female who presents upon referral to PT from Dr. Jake Samples with a medical diagnosis of lymphedema and concerns due to LE weakness and difficulty walking. Patient reports legs have been swelling since 2005 and officially received medical diagnosis in 2016. Patient also has PMH arthritis, BP, fibromyalgia, high BP, osteoporosis, depression/anxiety and obesity.   Subjective:     Ms. Nowotny presents to PT following OT session with BLE's wrapped and non-supportive footwear (flats) donned.  Ms. Jacobson reports she has been receiving occupational therapy services to address lymphedema, but has never participated in physical therapy intervention. Reports significant limitations with walking, standing, stairs, overall weakness and fatigue. Goals for PT include improving quality of  life, pain and overall mobility.   Quality of life: fair    Pain  Current pain rating: 10  At best pain rating: 10  At worst pain rating: 10  Location: Bilateral lower extremities, specifically dorsum of lateral R ankle/calf, tender to touch   Quality: aching, burning, sharp and shooting  Relieving factors: heat, lying and when still  Aggravating factors: bending, turning, standing, stairs, lifting and on the move  Pain Related Behaviors: other (Reports increased pain, emotional throughout evaluation)  Progression: worsening  Red Flags: Recommend follow up with PCP due to cellutlitis, increased pain.  Prior Functional Status:     Functional Limitation(s)-Standing, Walking, Lifting, ADL's, stairs, participation in household and community based activities, pain  Current functional status: use of assistive device, limited standing tolerance, limited lifting, limited exercise, limited bending, limited travel, limited recreation, unsteady gait, limited walking tolerance, limited sitting tolerance and difficulty with transfers    Precautions and Equipment  Precautions: full weight bearing, Fall risk and Osteoporosis  Current Braces/Orthoses: None  Equipment Currently Used: Quad cane: large base and Rolling walker (Pt currently using quad cane; reports has rolling walker but does not use)  Social Support  Lives in: [place]  Lives with: family members (Son, father)    Communication Preference: verbal  Barriers to Learning: emotional and physical  Work/School: Not currently employed     Diagnostic Tests    Diagnostic Test Comments:       See chart review (imaging) for full report.   Last x-ray hip 2019    Treatments  Previous treatment: corticosteroid injection and occupational therapy  Current treatment: physical therapy      Patient Goals  Patient goals for therapy: return to sport/leisure activities, return to recreational activites, increased strength, increased ROM, improved standing tolerance, improved sleep, improved sitting tolerance, improved balance, improved ambulation, decreased pain and decreased edema        Objective:     Static Posture Assessment   General Observations-Guarded.  Head-Forward and retracted.  Shoulders-Rounded.  Lumbar Spine-Decreased lordosis.   Pelvis-Posterior pelvic tilt  Hip  Hip (Left): Externally rotated and abducted.   Hip (Right): Externally rotated and abducted. Ankle/Foot  Ankle/Foot (Left): Pronated.   Ankle/Foot (Right): Pronated.     Postural Observations  Seated posture: fair  Standing posture: fair  Correction of posture: has no consistent effect  Additional Postural Observation Details-Pt demonstrates fair static seated balance requiring UE support; increased posterior lean secondary to decreased abdominal strength. Poor static seated balance.    Pt demonstrates fair static standing balance with quad cane in RUE. Poor static standing balance with assistive device. Poor dynamic balance.    Range of Motion  Hip-Active Rom   Left Hip  Flexion: with pain  Extension: with pain  Right Hip  Flexion: with pain  Extension: with pain  Additional Range of Motion Findings:      Limited AROM lumbar flexion, extension and lateral flexion B >50% secondary to pain    Strength/Myotome Testing-  Left Hip   Planes of Motion   Flexion: 3-  Extension: 3  Abduction: 3+  Adduction: 3+  External rotation: 3+  Internal rotation: 3+  Right Hip   Planes of Motion   Flexion: 3-  Extension: 3  Abduction: 3+  Adduction: 3+  External rotation: 3+  Internal rotation: 3+  Left Knee   Flexion: 3+  Extension: 3+  Right Knee   Flexion: 3+  Left Ankle/Foot   Plantar flexion: 4-  Inversion: 4-  Eversion: 4-  Great toe flexion: 4-  Right Ankle/Foot   Dorsiflexion: 3+  Plantar flexion: 4-  Inversion: 4-  Eversion: 4-  Great toe flexion: 4-  Additional Strength Details  Demonstrates significant strength deficits; proximal greater than distal throughout all postural muscles B.   Unable to achieve full AROM hip flexion against gravity secondary to weakness and pain.   Abdominal weakness evident by inability to maintain erect posturing; increased posterior lean on all surfaces in seated position.    Tests     Functional Assessment   Functional Assessment Comments-10MWT: 30 seconds using quad cane in RUE  5 Times Sit to Stand: Initiates, but pt has increased difficulty transferring from chair today requiring min (A) to mod (A) from therapist and increased time secondary to pain and weakness. Will continue to progress towards this as goal.  : Pt unable to ambulate for 2 consecutive minutes today secondary to pain, fatigue and poor endurance.    Ambulation   Weight-Bearing Status   Distance in feet: 30  Assistive device used: quad cane  Ambulation: Level Surfaces   Ambulation with assistive device: stand by assist  Ambulation without assistive device: unable  Additional Level Surfaces Ambulation Details-Pt ambulates 30 feet with quad cane RUE on level surfaces. Pt unable to ambulate without assistive device today.  Ambulation: Stairs  Additional Stairs Ambulation Details-Not performed today due to increased pain and significant weakness throughout LE's. Pt reports she does not have to perform stairs at home.   Observational Gait   Gait: antalgic   Decreased walking speed, stride length, left swing time, right swing time, left step length and right step length.   Left foot contact pattern: foot flat  Right foot contact pattern: foot flat  Left arm swing: decreased  Right arm swing: decreased  Additional Observational Gait Details-Pt demonstrates very poor and inefficient gait mechanics including decreased cadence, step-length, limited swing phase with decreased hip, knee flexion and AROM dorsiflexion for clearance, foot flat and initial contact. Pt is positioned with hip/forefoot external rotation and mid-foot ankle pronation B. Limited arm swing present.   Quality of Movement During Gait   Pelvis-Posterior pelvic tilt.   Knee-  Knee (Left): Positive increased ER tibial torsion. Negative increased flexion during stance.   Knee (Right): Positive increased ER tibial torsion. Negative increased flexion during stance.           PT Evaluation Charges  $$ PT Evaluation - MOD Complexity [mins]: 25     Therapeutic Interventions Charges  $$ Therapeutic Exercise [mins]: 20                 I attest that I have reviewed the above information.  SignedMarin Comment, PT  02/03/2021 8:52 AM

## 2021-02-03 NOTE — Unmapped (Signed)
[facility] OCCUPATIONAL THERAPY [place]  OUTPATIENT OCCUPATIONAL THERAPY  02/03/2021  Note Type: Treatment Note       Patient Name: Autumn Mcdonald  Date of Birth:05-28-1970  Diagnosis:   Encounter Diagnosis   Name Primary?   ??? Lymphedema Yes     Visit 10    Referring MD:  Ethlyn Gallery     Date OT Care Plan Established or Reviewed-10/19/20    Plan of Care Effective Date: 01/14/2021 - 02/13/2021    Assessment/Plan:    Assessment details:       Pt tolerated all treatment methods fair with no adverse reaction to therapy. Pt's BLE skin integrity was poor+ on this date as skin was noted to be more pink with decreased weeping. On this date BLEs were noted to have warmth and be more tender to the touch.        Pt is a 50 year old female with a medical and treatment dx of lymphedema. Pt has been seen for 6 outpatient occupational therapy treatment sessions with good tolerance for all treatment methods. She has made good progress in goals but recently had a set back with additional weeping in BLE that we are now addressing. She does not have significant wounds, just weeping. Compression bandaging is assisting with resolving this. Please see goals for updates.  Pt presents with body system impairments including edema, skin integrity that impair her general health, ambulation, ADL, and IADL functions. She would benefit from continued outpatient occupational therapy to address lymphedema in order to reach highest level of function.   Impairments: fall risk, increased edema, uncontrolled swelling, impaired ADLs, decreased skin integrity and decreased mobility  ??  Pt also demonstrates a need for physical therapy due to high risk of falls, poor balance and decreased mobility.   Impairments: fall risk, impaired balance, increased edema, pain, decreased endurance, decreased mobility, impaired ADLs and decreased skin integrity    Personal Factors/Comorbidities: 3    Examination of Body Systems: 3 elements    Clinical Presentation: evolving  Clinical Decision Making: moderate  Prognosis: good prognosis  Positive Prognosis Rationale: age and motivated for treatment.  Negative Prognosis Rationale: insight, chronicity of condition, severity of symptoms, body habitus and balance.    Therapy Goals  Goals:       Goals:      Short Term Goals:  1. Pt's circumference of BLE will be reduced by 1cm at all measurements within  2 weeks at feet and knees.   CURRENT STATUS: Progress made.  Continue goal for mastery.    2. Pt's skin integrity of BLE will improve at ankles in 2 weeks to decrease  potential for infection.  CURRENT STATUS: BLE now weeping. Continue for mastery.     3. Pt will be educated on self MLD and recall it with 100% accuracy to perform  in the home in 2 weeks.  CURRENT STATUS: Pt has been given information. Goal in progress. Continue for mastery.  4. Pt's skin texture of BLE will improve to a soft, supple state within 2  Weeks.  CURRENT STATUS: Slow improvement. Continue for mastery.    Long Term Goals:  1. Pt's circumference of bilateral lower extremities will be reduced by 2cm at  all appropriate measurements within 4 weeks to increase ADL function.  CURRENT STATUS: Progression. Continue for mastery.  2. Pt will recall all precautions to prevent lymphedema exacerbation at 100% in  4 weeks.  CURRENT STATUS: GOAL MET  3. Pt will be fitted  for compression garments to address BLE edema with  independent donning in 4 weeks.  CURRENT STATUS: Pt has not yet been fitted for garments. She will need them for her upper BLE.   4. Pt will be independent with home management of lymphedema in 4 weeks. Continue for mastery.   CURRENT STATUS: Pt does not yet have the tools for independent management of lymphedema. Continue for mastery.   Plan  Therapy options: will be seen for skilled occupational therapy services  Planned therapy interventions: Compression Bandaging, Compression Pump, Education - Patient, Home Exercise Program, Garment Measurement, Therapeutic Exercises, Therapeutic Activities, Manual Therapy and Manual Lymph Drainage  DME Equipment: compression pump, lymphedema bandages and compression garments.  Frequency: 2x week  Duration in weeks: 4 weeks  Education provided to: patient.  Education provided: Importance of Therapy, HEP, Lymphedema precautions and Manual lymph drainage  Education results: needs reinforcement and needs further instruction.      Total Session Time: 45  Treatment rendered today:         Pt then lay in supine with HOB elevated for comfort. Therapist completed manual lymph drainage pre-treatment to bilateral axilla and then full MLD to thighs only. Therapist placed size F stockinette on BLE with 2 rolls of coban on each leg to maintain results of MLD. Therapist also cleaned BLE prior to placing dressings on BLE.          Subjective:   History of Present Illness  Date of Onset: 05/17/2003    Date of Evaluation: 10/19/2020    Reason for Referral/Chief Complaint:       Pt reports that her legs have been swelling since 2005. She was sent to rumatologist in 2014/2015 with an appropriate dx then and it has progressively gotten worse. She has also been getting treatment for psoriasis. Pt has been seen at wound center in [place] for the past 17 weeks. She has not been released by them yet. Pt desires to wear appropriate shoes and to have a better life. She is seeking outpatient lymphedema treatment to address BLE lymphedema.   Subjective:     Pt called therapist prior to attending and reported that her legs were hurting. Therapist called back reinforcing the importance of attendance. Pt also had PT evaluation today. She reports pain 10/10 with a sharp, burning quality. Therapist advised pt to seek advice from her PCP as it appears that she still has active infection and current antibiotic is not effective.  Quality of life: fair    Pain  Current pain rating: 3  At best pain rating: 0  At worst pain rating: 10  Location: BLE  Quality: needle-like, hot, sharp, tender and burning  Relieving factors: elevation  Aggravating factors: walking  Progression: worsening  Red flags: night pain and disturbed sleep.  Current functional status: limited walking tolerance, unsteady gait, disturbed sleep, leisure activities, limited standing tolerance, use of assistive device, limited recreation, limited household activities and limited exercise    Precautions and Equipment  Precautions: Fall risk and Cancer history  Equipment Currently Used: Metallurgist, Training and development officer  Social Support  Lives in: two story house  Lives with: family members  Hand dominance: right  Communication Preference: verbal  Barriers to Learning: No Barriers  Work/School: on disability    Diagnostic Tests    Diagnostic Test Comments:       none    Treatments  No previous or current treatments      Patient Goals  Patient goals  for therapy: decrease assistance with ADLs, decreased edema, decreased pain, improve fit of clothing, improve fit of shoes, improve independence with dressing, improved ambulation, improved sleep, independence with ADLs/IADLs, obtain compression garment and return to sport/leisure activities  Patient goal: obatin pneumatic pump      Objective:   Lymphedema:   Treatment:     Lymph node removal: No      Chemotherapy: No      Radiation therapy: No      Previous lymphedema treatment: No      Reoccurrence?: No    Current Management:     Current management details:  CDT, compression placed by therapist    Daytime:  Performs exercise    Nighttime:  Nothing  Contraindications:     Contraindications:  None  Objective:     Location of swelling:  Right, left, ankle, toes, knee, thigh, groin and foot    Fold thickness:  Moderate    Fibrosis: No      Skin assessment:  Discoloration, edema and dry skin    Skin assessment comments:  Skin is noted to be dark pink. Weeping noted    Incision:  N/A    Color:  Pink    Temperature:  Normal    Hair growth:  Reduced Stemmer's sign:  +    Wound: No    Posture/Observations:     Sitting:  Rounded shoulders    Standing:  Forward flexed posture and wide BOS    Sleeping:  Recliner  ROM and Strength:     ROM and Strength findings:  BLE WFL  Tests:     Lower Extremity Functional Scale Score (out of 80):  2  Lymphedema additional information:  Measurements:    RLE:                    12/15/20:         01/14/21:  MTP: 24.5cm        25cm         24cm  Heel: 37cm           36cm          35.5cm  Ankle: 31cm         33.1cm        31.2cm  10cm: 33cm         43.8cm        42.2cm  20cm: 49cm         57.5cm        48.5cm  Knee: 56.3cm       59.5cm        56.5cm  10cm: 84.2cm??      87cm          86.6cm  20cm: 92.8cm       97.5cm       95.5cm     LLE:  MTP: 25.6cm        24.5cm       24cm  Heel: 37.2cm        35.8cm        35.4cm  Ankle: 34cm          33cm          33cm  10cm: 36.3cm       41.5cm        41cm  20cm: 54.5cm       58.5cm       54.1cm  Knee: 69cm           73cm  68.5cm  10cm: 90.2cm       96cm           90.1cm  20cm: 100cm        99.9cm        98cm                               I attest that I have reviewed the above information.  Signed: Melvern Banker, OT  02/03/2021 3:29 PM

## 2021-02-09 ENCOUNTER — Other Ambulatory Visit: Payer: Self-pay | Admitting: Family Medicine

## 2021-02-09 DIAGNOSIS — I890 Lymphedema, not elsewhere classified: Principal | ICD-10-CM

## 2021-02-09 DIAGNOSIS — L52 Erythema nodosum: Principal | ICD-10-CM

## 2021-02-09 DIAGNOSIS — M79604 Pain in right leg: Principal | ICD-10-CM

## 2021-02-09 DIAGNOSIS — L409 Psoriasis, unspecified: Principal | ICD-10-CM

## 2021-02-09 DIAGNOSIS — M79605 Pain in left leg: Principal | ICD-10-CM

## 2021-02-09 DIAGNOSIS — R2689 Other abnormalities of gait and mobility: Principal | ICD-10-CM

## 2021-02-09 DIAGNOSIS — Z9181 History of falling: Secondary | ICD-10-CM | POA: Diagnosis not present

## 2021-02-09 NOTE — Unmapped (Signed)
[facility] OCCUPATIONAL THERAPY [place]  OUTPATIENT OCCUPATIONAL THERAPY  02/09/2021  Note Type: Re-evaluation/Progress Note  Note Date Range: 01/14/21 through 02/09/21    Patient Name: Autumn Mcdonald  Date of Birth:Jan 30, 1971  Diagnosis:   Encounter Diagnosis   Name Primary?   ??? Lymphedema Yes     Visit 11    Referring MD:  Ethlyn Gallery     Date OT Care Plan Established or Reviewed-10/19/20    Plan of Care Effective Date: 02/09/2021 - 03/06/2021    Assessment/Plan:    Assessment details:       Pt tolerated all treatment methods fair with no adverse reaction to therapy. Pt's BLE skin integrity was poor+ on this date as skin was noted to be more pink with decreased weeping. On this date BLEs were noted to have warmth and be more tender to the touch. Cleaning tolerated with moderate tolerance.        Pt is a 50 year old female with a medical and treatment dx of lymphedema. Pt has been seen for 11 outpatient occupational therapy treatment sessions with good tolerance for all treatment methods. She has made good progress in goals but recently had a set back with additional infection in BLE that we are now addressing. She does have significant infection. Compression bandaging has assisted with weeping which has decreased. Please see goals for updates.  Pt presents with body system impairments including edema, skin integrity that impair her general health, ambulation, ADL, and IADL functions. She would benefit from continued outpatient occupational therapy to address lymphedema in order to reach highest level of function.   Impairments: fall risk, increased edema, uncontrolled swelling, impaired ADLs, decreased skin integrity and decreased mobility  ??    Impairments: fall risk, impaired balance, increased edema, pain, decreased endurance, decreased mobility, impaired ADLs and decreased skin integrity    Personal Factors/Comorbidities: 3    Examination of Body Systems: 3 elements    Clinical Presentation: evolving  Clinical Decision Making: moderate  Prognosis: good prognosis  Positive Prognosis Rationale: age and motivated for treatment.  Negative Prognosis Rationale: insight, chronicity of condition, severity of symptoms, body habitus and balance.    Therapy Goals  Goals:       Goals:      Short Term Goals:  1. Pt's circumference of BLE will be reduced by 1cm at all measurements within  2 weeks at feet and knees.   CURRENT STATUS: Progress made.  Continue goal for mastery.    2. Pt's skin integrity of BLE will improve at ankles in 2 weeks to decrease  potential for infection.  CURRENT STATUS: BLE now decreased due to infection. Continue for mastery.     3. Pt will be educated on self MLD and recall it with 100% accuracy to perform  in the home in 2 weeks.  CURRENT STATUS: Pt has been given information. Goal in progress. Continue for mastery.  4. Pt's skin texture of BLE will improve to a soft, supple state within 2  Weeks.  CURRENT STATUS: Slow improvement. Continue for mastery.    Long Term Goals:  1. Pt's circumference of bilateral lower extremities will be reduced by 2cm at  all appropriate measurements within 4 weeks to increase ADL function.  CURRENT STATUS: Progression. Continue for mastery.  2. Pt will recall all precautions to prevent lymphedema exacerbation at 100% in  4 weeks.  CURRENT STATUS: GOAL MET  3. Pt will be fitted for compression garments to address BLE edema with  independent donning in 4 weeks.  CURRENT STATUS: Pt has not yet been fitted for garments. She will need them for her upper BLE.   4. Pt will be independent with home management of lymphedema in 4 weeks. Continue for mastery.   CURRENT STATUS: Pt does not yet have the tools for independent management of lymphedema. Continue for mastery.   Plan  Therapy options: will be seen for skilled occupational therapy services  Planned therapy interventions: Compression Bandaging, Compression Pump, Education - Patient, Home Exercise Program, Garment Measurement, Therapeutic Exercises, Therapeutic Activities, Manual Therapy and Manual Lymph Drainage  DME Equipment: compression pump, lymphedema bandages and compression garments.  Frequency: 2x week  Duration in weeks: 4 weeks  Education provided to: patient.  Education provided: Importance of Therapy, HEP, Lymphedema precautions and Manual lymph drainage  Education results: needs reinforcement and needs further instruction.      Total Session Time: 45  Treatment rendered today:         Pt then lay in supine with HOB elevated for comfort. Therapist completed manual lymph drainage pre-treatment to bilateral axilla and then full MLD to thighs only. Therapist placed size F stockinette on BLE with 1 roll of coban on each leg to maintain results of MLD. Therapist also cleaned BLE prior to placing dressings on BLE.  Reevaluation complete.         Subjective:   History of Present Illness  Date of Onset: 05/17/2003    Date of Evaluation: 10/19/2020    Reason for Referral/Chief Complaint:       Pt reports that her legs have been swelling since 2005. She was sent to rumatologist in 2014/2015 with an appropriate dx then and it has progressively gotten worse. She has also been getting treatment for psoriasis. Pt has been seen at wound center in [place] for the past 17 weeks. She has not been released by them yet. Pt desires to wear appropriate shoes and to have a better life. She is seeking outpatient lymphedema treatment to address BLE lymphedema.   Subjective:     Pt reports that she called her physician and was prescribed the same antibiotic again. She reports that she will be going to see physician tomorrow because there is little resolve. Pt reports that weeping has decreased but the sensitivity has not.   Quality of life: fair    Pain  Current pain rating: 8  At best pain rating: 0  At worst pain rating: 10  Location: BLE  Quality: needle-like, hot, sharp, tender and burning  Relieving factors: elevation  Aggravating factors: walking  Progression: worsening  Red flags: night pain and disturbed sleep.  Current functional status: limited walking tolerance, unsteady gait, disturbed sleep, leisure activities, limited standing tolerance, use of assistive device, limited recreation, limited household activities and limited exercise    Precautions and Equipment  Precautions: Fall risk and Cancer history  Equipment Currently Used: Metallurgist, Training and development officer  Social Support  Lives in: two story house  Lives with: family members  Hand dominance: right  Communication Preference: verbal  Barriers to Learning: No Barriers  Work/School: on disability    Diagnostic Tests    Diagnostic Test Comments:       none    Treatments  No previous or current treatments      Patient Goals  Patient goals for therapy: decrease assistance with ADLs, decreased edema, decreased pain, improve fit of clothing, improve fit of shoes, improve independence with dressing, improved ambulation, improved  sleep, independence with ADLs/IADLs, obtain compression garment and return to sport/leisure activities        Objective:   Lymphedema:   Treatment:     Lymph node removal: No      Chemotherapy: No      Radiation therapy: No      Previous lymphedema treatment: No      Reoccurrence?: No    Current Management:     Current management details:  CDT, compression placed by therapist    Daytime:  Performs exercise    Nighttime:  Nothing  Contraindications:     Contraindications:  None  Objective:     Location of swelling:  Right, left, ankle, toes, knee, thigh, groin and foot    Fold thickness:  Moderate    Fibrosis: No      Skin assessment:  Discoloration, edema, leathery and moist    Skin assessment comments:  Skin is noted to be bright pink. Very little weeping noted. Peeling skin    Incision:  N/A    Color:  Pink    Temperature:  Normal    Hair growth:  Reduced    Stemmer's sign:  +    Wound: No    Posture/Observations:     Sitting:  Rounded shoulders Standing:  Forward flexed posture and wide BOS    Sleeping:  Recliner  ROM and Strength:     ROM and Strength findings:  BLE WFL  Tests:     Lower Extremity Functional Scale Score (out of 80):  2  Lymphedema additional information:  Measurements:    RLE:                    12/15/20:         01/14/21:  MTP: 24.5cm        25cm         24cm  Heel: 37cm           36cm          35.5cm  Ankle: 31cm         33.1cm        31.2cm  10cm: 33cm         43.8cm        42.2cm  20cm: 49cm         57.5cm        48.5cm  Knee: 56.3cm       59.5cm        56.5cm  10cm: 84.2cm??      87cm          86.6cm  20cm: 92.8cm       97.5cm       95.5cm     LLE:  MTP: 25.6cm        24.5cm       24cm  Heel: 37.2cm        35.8cm        35.4cm  Ankle: 34cm          33cm          33cm  10cm: 36.3cm       41.5cm        41cm  20cm: 54.5cm       58.5cm       54.1cm  Knee: 69cm           73cm           68.5cm  10cm: 90.2cm       96cm  90.1cm  20cm: 100cm        99.9cm        98cm    *BLE unable to be measured 02/09/21 as BLE too tender to the touch. Updated measurements will be taken once infection resolves.                                I attest that I have reviewed the above information.  Signed: Melvern Banker, OT  02/09/2021 4:50 PM

## 2021-02-10 ENCOUNTER — Encounter: Payer: Self-pay | Admitting: Family Medicine

## 2021-02-10 ENCOUNTER — Ambulatory Visit (INDEPENDENT_AMBULATORY_CARE_PROVIDER_SITE_OTHER): Payer: Medicare Other | Admitting: Family Medicine

## 2021-02-10 ENCOUNTER — Other Ambulatory Visit: Payer: Self-pay

## 2021-02-10 VITALS — BP 122/70 | HR 89 | Temp 98.3°F | Resp 17 | Ht 62.0 in | Wt 311.6 lb

## 2021-02-10 DIAGNOSIS — Z23 Encounter for immunization: Secondary | ICD-10-CM | POA: Diagnosis not present

## 2021-02-10 DIAGNOSIS — L03115 Cellulitis of right lower limb: Secondary | ICD-10-CM

## 2021-02-10 MED ORDER — DOXYCYCLINE HYCLATE 100 MG PO TABS: 100.0000 mg | ORAL_TABLET | Freq: Two times a day (BID) | ORAL | 0 refills | Status: DC

## 2021-02-10 NOTE — Progress Notes (Signed)
   Subjective:    Patient ID: [name], female    DOB: 1971/01/18, 50 y.o.   MRN: [id number]  HPI Cellulitis- recurrent problem for pt due to venous stasis and lymphedema.  'they are better, they're just not completely healed.  Lymphedema therapist would like another round of abx 'just to be safe'.  Pt reports legs have been red, warm, oozing.  No fevers.  No N/V, chills.   Review of Systems For ROS see HPI   This visit occurred during the SARS-CoV-2 public health emergency.  Safety protocols were in place, including screening questions prior to the visit, additional usage of staff PPE, and extensive cleaning of exam room while observing appropriate contact time as indicated for disinfecting solutions.      Objective:   Physical Exam Vitals reviewed.  Constitutional:      General: She is not in acute distress.    Appearance: Normal appearance. She is obese. She is not ill-appearing.  HENT:     Head: Normocephalic and atraumatic.  Skin:    General: Skin is warm.     Coloration: Jaundice: bilateral lower extremities red, warm, weeping w/ multiple breaks in skin.     Findings: Erythema present.  Neurological:     Mental Status: She is alert and oriented to person, place, and time. Mental status is at baseline.  Psychiatric:        Mood and Affect: Mood normal.        Behavior: Behavior normal.        Thought Content: Thought content normal.          Assessment & Plan:   Recurrent cellulitis- ongoing issue due to venous stasis dermatitis and lymphedema.  Will repeat course of Doxy as both legs are red, warm, and weeping.  Pt expressed understanding and is in agreement w/ plan.

## 2021-02-10 NOTE — Patient Instructions (Signed)
Follow up as needed or as scheduled Repeat the Doxy- take w/ food Keep legs elevated when able Call with any questions or concerns Stay Safe!  Stay Healthy! Happy Fall!

## 2021-02-11 DIAGNOSIS — M79605 Pain in left leg: Principal | ICD-10-CM

## 2021-02-11 DIAGNOSIS — L409 Psoriasis, unspecified: Principal | ICD-10-CM

## 2021-02-11 DIAGNOSIS — M79604 Pain in right leg: Principal | ICD-10-CM

## 2021-02-11 DIAGNOSIS — R2689 Other abnormalities of gait and mobility: Principal | ICD-10-CM

## 2021-02-11 DIAGNOSIS — I890 Lymphedema, not elsewhere classified: Principal | ICD-10-CM

## 2021-02-11 DIAGNOSIS — L52 Erythema nodosum: Principal | ICD-10-CM

## 2021-02-11 DIAGNOSIS — Z9181 History of falling: Secondary | ICD-10-CM | POA: Diagnosis not present

## 2021-02-11 MED ORDER — SKYRIZI 150 MG/ML SUBCUTANEOUS SYRINGE
1 refills | 0 days
Start: 2021-02-11 — End: ?

## 2021-02-11 NOTE — Telephone Encounter (Signed)
Patient is requesting a refill of the following medications: Requested Prescriptions   Pending Prescriptions Disp Refills   phentermine 37.5 MG capsule 90 capsule 0    Sig: Take 1 capsule (37.5 mg total) by mouth every morning.   ALPRAZolam (XANAX) 0.5 MG tablet 30 tablet 0    Sig: Take 1 tablet (0.5 mg total) by mouth 2 (two) times daily as needed for anxiety.    Date of patient request:  Last office visit: 02/10/2021 Date of last refill: Last refill amount:  Follow up time period per chart: 03/04/2021

## 2021-02-11 NOTE — Unmapped (Signed)
[facility] OCCUPATIONAL THERAPY [place]  OUTPATIENT OCCUPATIONAL THERAPY  02/11/2021  Note Type: Treatment Note       Patient Name: Autumn Mcdonald  Date of Birth:Jan 18, 1971  Diagnosis:   Encounter Diagnosis   Name Primary?   ??? Lymphedema Yes     Visit 12    Referring MD:  Ethlyn Gallery     Date OT Care Plan Established or Reviewed-10/19/20    Plan of Care Effective Date: 02/09/2021 - 03/11/2021    Assessment/Plan:    Assessment details:       Pt tolerated all treatment methods fair with no adverse reaction to therapy. Pt's BLE skin integrity was poor+ on this date as skin was noted to be less pink with decreased weeping. On this date BLEs were noted to have decreased warmth and be less tender to the touch. Cleaning tolerated with good tolerance.        Pt is a 50 year old female with a medical and treatment dx of lymphedema. Pt has been seen for 11 outpatient occupational therapy treatment sessions with good tolerance for all treatment methods. She has made good progress in goals but recently had a set back with additional infection in BLE that we are now addressing. She does have significant infection. Compression bandaging has assisted with weeping which has decreased. Please see goals for updates.  Pt presents with body system impairments including edema, skin integrity that impair her general health, ambulation, ADL, and IADL functions. She would benefit from continued outpatient occupational therapy to address lymphedema in order to reach highest level of function.   Impairments: fall risk, increased edema, uncontrolled swelling, impaired ADLs, decreased skin integrity and decreased mobility  ??    Impairments: fall risk, impaired balance, increased edema, pain, decreased endurance, decreased mobility, impaired ADLs and decreased skin integrity    Personal Factors/Comorbidities: 3    Examination of Body Systems: 3 elements    Clinical Presentation: evolving  Clinical Decision Making: moderate  Prognosis: good prognosis  Positive Prognosis Rationale: age and motivated for treatment.  Negative Prognosis Rationale: insight, chronicity of condition, severity of symptoms, body habitus and balance.    Therapy Goals  Goals:       Goals:      Short Term Goals:  1. Pt's circumference of BLE will be reduced by 1cm at all measurements within  2 weeks at feet and knees.   CURRENT STATUS: Progress made.  Continue goal for mastery.    2. Pt's skin integrity of BLE will improve at ankles in 2 weeks to decrease  potential for infection.  CURRENT STATUS: BLE now decreased due to infection. Continue for mastery.     3. Pt will be educated on self MLD and recall it with 100% accuracy to perform  in the home in 2 weeks.  CURRENT STATUS: Pt has been given information. Goal in progress. Continue for mastery.  4. Pt's skin texture of BLE will improve to a soft, supple state within 2  Weeks.  CURRENT STATUS: Slow improvement. Continue for mastery.    Long Term Goals:  1. Pt's circumference of bilateral lower extremities will be reduced by 2cm at  all appropriate measurements within 4 weeks to increase ADL function.  CURRENT STATUS: Progression. Continue for mastery.  2. Pt will recall all precautions to prevent lymphedema exacerbation at 100% in  4 weeks.  CURRENT STATUS: GOAL MET  3. Pt will be fitted for compression garments to address BLE edema with  independent donning  in 4 weeks.  CURRENT STATUS: Pt has not yet been fitted for garments. She will need them for her upper BLE.   4. Pt will be independent with home management of lymphedema in 4 weeks. Continue for mastery.   CURRENT STATUS: Pt does not yet have the tools for independent management of lymphedema. Continue for mastery.   Plan  Therapy options: will be seen for skilled occupational therapy services  Planned therapy interventions: Compression Bandaging, Compression Pump, Education - Patient, Home Exercise Program, Garment Measurement, Therapeutic Exercises, Therapeutic Activities, Manual Therapy and Manual Lymph Drainage  DME Equipment: compression pump, lymphedema bandages and compression garments.  Frequency: 2x week  Duration in weeks: 4 weeks  Education provided to: patient.  Education provided: Importance of Therapy, HEP, Lymphedema precautions and Manual lymph drainage  Education results: needs reinforcement and needs further instruction.      Total Session Time: 55  Treatment rendered today:         Pt then lay in supine with HOB elevated for comfort. Therapist completed manual lymph drainage pre-treatment to bilateral axilla and then full MLD to thighs only. Therapist placed size F stockinette on BLE with 1 roll of coban on each leg to maintain results of MLD. Therapist also cleaned BLE prior to placing dressings on BLE.          Subjective:   History of Present Illness  Date of Onset: 05/17/2003    Date of Evaluation: 10/19/2020    Reason for Referral/Chief Complaint:       Pt reports that her legs have been swelling since 2005. She was sent to rumatologist in 2014/2015 with an appropriate dx then and it has progressively gotten worse. She has also been getting treatment for psoriasis. Pt has been seen at wound center in [name] for the past 17 weeks. She has not been released by them yet. Pt desires to wear appropriate shoes and to have a better life. She is seeking outpatient lymphedema treatment to address BLE lymphedema.   Subjective:     Pt reports 5/10 pain on this date in BLE. She reports that she does feel some better after being on additional antibiotic. She did return to physician and was instructed to call if she felt that antibiotic was not working.   Quality of life: fair    Pain  Current pain rating: 5  At best pain rating: 0  At worst pain rating: 10  Location: BLE  Quality: needle-like, hot, sharp, tender and burning  Relieving factors: elevation  Aggravating factors: walking  Progression: worsening  Red flags: night pain and disturbed sleep.  Current functional status: limited walking tolerance, unsteady gait, disturbed sleep, leisure activities, limited standing tolerance, use of assistive device, limited recreation, limited household activities and limited exercise    Precautions and Equipment  Precautions: Fall risk and Cancer history  Equipment Currently Used: Metallurgist, Training and development officer  Social Support  Lives in: two story house  Lives with: family members  Hand dominance: right  Communication Preference: verbal  Barriers to Learning: No Barriers  Work/School: on disability    Diagnostic Tests    Diagnostic Test Comments:       none    Treatments  No previous or current treatments      Patient Goals  Patient goals for therapy: decrease assistance with ADLs, decreased edema, decreased pain, improve fit of clothing, improve fit of shoes, improve independence with dressing, improved ambulation, improved sleep, independence with ADLs/IADLs, obtain  compression garment and return to sport/leisure activities        Objective:   Lymphedema:   Treatment:     Lymph node removal: No      Chemotherapy: No      Radiation therapy: No      Previous lymphedema treatment: No      Reoccurrence?: No    Current Management:     Current management details:  CDT, compression placed by therapist    Daytime:  Performs exercise    Nighttime:  Nothing  Contraindications:     Contraindications:  None  Objective:     Location of swelling:  Right, left, ankle, toes, knee, thigh, groin and foot    Fold thickness:  Moderate    Fibrosis: No      Skin assessment:  Discoloration, edema, leathery and moist    Skin assessment comments:  Skin is noted to be bright pink. Very little weeping noted. Peeling skin    Incision:  N/A    Color:  Pink    Temperature:  Normal    Hair growth:  Reduced    Stemmer's sign:  +    Wound: No    Posture/Observations:     Sitting:  Rounded shoulders    Standing:  Forward flexed posture and wide BOS    Sleeping:  Recliner  ROM and Strength:     ROM and Strength findings:  BLE WFL  Tests:     Lower Extremity Functional Scale Score (out of 80):  2  Lymphedema additional information:  Measurements:    RLE:                    12/15/20:         01/14/21:  MTP: 24.5cm        25cm         24cm  Heel: 37cm           36cm          35.5cm  Ankle: 31cm         33.1cm        31.2cm  10cm: 33cm         43.8cm        42.2cm  20cm: 49cm         57.5cm        48.5cm  Knee: 56.3cm       59.5cm        56.5cm  10cm: 84.2cm??      87cm          86.6cm  20cm: 92.8cm       97.5cm       95.5cm     LLE:  MTP: 25.6cm        24.5cm       24cm  Heel: 37.2cm        35.8cm        35.4cm  Ankle: 34cm          33cm          33cm  10cm: 36.3cm       41.5cm        41cm  20cm: 54.5cm       58.5cm       54.1cm  Knee: 69cm           73cm           68.5cm  10cm: 90.2cm       96cm  90.1cm  20cm: 100cm        99.9cm        98cm    *BLE unable to be measured 02/09/21 as BLE too tender to the touch. Updated measurements will be taken once infection resolves.                                I attest that I have reviewed the above information.  Signed: Melvern Banker, OT  02/11/2021 4:06 PM

## 2021-02-12 MED ORDER — PHENTERMINE HCL 37.5 MG PO CAPS: 37.5000 mg | ORAL_CAPSULE | ORAL | 0 refills | Status: DC

## 2021-02-12 MED ORDER — ALPRAZOLAM 0.5 MG PO TABS: 0.5000 mg | ORAL_TABLET | Freq: Two times a day (BID) | ORAL | 0 refills | Status: DC | PRN

## 2021-02-12 MED ORDER — SKYRIZI 150 MG/ML SUBCUTANEOUS SYRINGE
SUBCUTANEOUS | 0 refills | 0.00000 days | Status: CP
Start: 2021-02-12 — End: ?
  Filled 2021-02-17: qty 1, 84d supply, fill #0

## 2021-02-12 NOTE — Unmapped (Signed)
[facility] Clinical Assessment & Refill Coordination Note    Autumn Mcdonald, DOB: Jan 08, 1971  Phone: [phone] (home)     All above HIPAA information was verified with patient.     Was a Nurse, learning disability used for this call? No    Specialty Medication(s):   Inflammatory Disorders: Skyrizi     Current Outpatient Medications   Medication Sig Dispense Refill   ??? ALPRAZolam (XANAX) 0.5 MG tablet Take 0.5 mg by mouth.     ??? ARIPiprazole (ABILIFY) 10 MG tablet Take 10 mg by mouth daily.     ??? armodafiniL (NUVIGIL) 250 mg tablet Take 250 mg by mouth three (3) times a day.      ??? biotin 10 mg Tab 10 mg per 1 tablet, ORAL, Daily, 0 Refill(s)     ??? buPROPion (WELLBUTRIN XL) 150 MG 24 hr tablet Take 150 mg by mouth.     ??? busPIRone (BUSPAR) 30 MG tablet Take 30 mg by mouth.     ??? calcium citrate (CALCITRATE) 200 mg elem calcium (950 mg) tablet 950 mg per 1 tablet, ORAL, Daily, 60 tablet, 0 Refill(s)     ??? clonazePAM (KLONOPIN) 1 MG tablet TAKE 1 TABLET BY MOUTH EVERY DAY     ??? cyclobenzaprine (FLEXERIL) 10 MG tablet Take 10 mg by mouth once as needed.      ??? diclofenac sodium (VOLTAREN) 1 % gel Apply 4grams topically 4 TIMES DAILY 300 g 5   ??? DULoxetine (CYMBALTA) 60 MG capsule Take 60 mg by mouth daily. 120 mg daily     ??? ferrous sulfate 325 (65 FE) MG tablet Take 1 tablet (325 mg total) by mouth daily with breakfast.  6   ??? furosemide (LASIX) 80 MG tablet 20 mg.     ??? gabapentin (NEURONTIN) 600 MG tablet Take 2 tablets (1,200 mg total) by mouth Three (3) times a day. 180 tablet 11   ??? HYDROcodone-acetaminophen (NORCO 10-325) 10-325 mg per tablet Take 1 tablet by mouth.     ??? MEDICAL SUPPLY ITEM Pneumatic pump for Lymphedema  [facility]   Attn: Bartholomew Crews, Energy manager   Fax: [phone] 1 each 0   ??? nebivoloL (BYSTOLIC) 10 MG tablet Take 10 mg by mouth daily.      ??? nystatin (MYCOSTATIN) 100,000 unit/gram ointment Apply topically Two (2) times a day. 30 g 0   ??? NYSTOP 100,000 unit/gram powder As needed     ??? omeprazole (PRILOSEC) 20 MG capsule Take 1 capsule by mouth daily.     ??? OXcarbazepine (TRILEPTAL) 300 MG tablet Take 300 mg by mouth.     ??? pantoprazole (PROTONIX) 40 MG tablet Take 40 mg by mouth Two (2) times a day. bid     ??? pentoxifylline (TRENTAL) 400 mg CR tablet Take 1 tablet (400 mg total) by mouth Three (3) times a day with a meal. 270 tablet 3   ??? promethazine (PHENERGAN) 25 MG tablet once as needed.      ??? risankizumab-rzaa (SKYRIZI) 150 mg/mL Syrg Inject the contents of 1 syringe (150 mg) under the skin every 12 weeks. 1 mL 0   ??? rosuvastatin (CRESTOR) 20 MG tablet TAKE 1 TABLET BY MOUTH EVERY DAY       No current facility-administered medications for this visit.        Changes to medications: [name] reports no changes at this time.    Allergies   Allergen Reactions   ??? Iohexol Hives  Pt treated with PO benedryl   ??? Keflex [Cephalexin] Shortness Of Breath and Hives   ??? Lincomycin Hcl Hives   ??? Lisinopril Hives     Hives  Hives   ??? Sulfa (Sulfonamide Antibiotics) Swelling and Rash   ??? Sulfamethoxazole-Trimethoprim Hives     REACTION: Rash, throat closed, eyes swelled.  Hives  Hives   ??? Aspirin      REACTION: Hives  High Doses only. Causes rash.  High Doses only. Causes rash.   ??? Doxycycline      REACTION: Hives  Hives  Hives   ??? Naproxen      REACTION: Hives  Eyes well  Eyes well   ??? Niacin      REACTION: Swelling, problems breathing   ??? Benzoin Compound Rash   ??? Pneumococcal 23-Val Ps Vaccine Rash     cellulitis       Changes to allergies: Yes: patient reports that she is able to take doxycycline    SPECIALTY MEDICATION ADHERENCE     Skyrizi 150 mg/ml: 0 days of medicine on hand       Medication Adherence    Patient reported X missed doses in the last month: 0  Specialty Medication: Skyrizi          Specialty medication(s) dose(s) confirmed: Regimen is correct and unchanged.     Are there any concerns with adherence? No    Adherence counseling provided? Not needed    CLINICAL MANAGEMENT AND INTERVENTION      Clinical Benefit Assessment:    Do you feel the medicine is effective or helping your condition? Yes    Clinical Benefit counseling provided? Not needed    Adverse Effects Assessment:    Are you experiencing any side effects? No    Are you experiencing difficulty administering your medicine? No    Quality of Life Assessment:    Quality of Life        Dermatology  1. What impact has your specialty medication had on the symptoms of your skin condition (i.e. itchiness, soreness, stinging)?: Some  2. What impact has your specialty medication had on your comfort level with your skin?: Some                 Have you discussed this with your provider? Not needed    Acute Infection Status:    Acute infections noted within Epic:  No active infections  Patient reported infection: None    Therapy Appropriateness:    Is therapy appropriate and patient progressing towards therapeutic goals? Yes, therapy is appropriate and should be continued    DISEASE/MEDICATION-SPECIFIC INFORMATION      For patients on injectable medications: Patient currently has 0 doses left.  Next injection is scheduled for 10/10.    PATIENT SPECIFIC NEEDS     - Does the patient have any physical, cognitive, or cultural barriers? No    - Is the patient high risk? No    - Does the patient require a Care Management Plan? No     - Does the patient require physician intervention or other additional services (i.e. nutrition, smoking cessation, social work)? No      SHIPPING     Specialty Medication(s) to be Shipped:   Inflammatory Disorders: Skyrizi    Other medication(s) to be shipped: No additional medications requested for fill at this time     Changes to insurance: No    Delivery Scheduled: Yes, Expected medication delivery date: 10/6.  Medication will be delivered via UPS to the confirmed prescription address in [facility]    The patient will receive a drug information handout for each medication shipped and additional FDA Medication Guides as required.  Verified that patient has previously received a Conservation officer, historic buildings and a Surveyor, mining.     The patient or caregiver noted above participated in the development of this care plan and knows that they can request review of or adjustments to the care plan at any time.      All of the patient's questions and concerns have been addressed.    Clydell Hakim   [facility] Specialty Pharmacist

## 2021-02-12 NOTE — Unmapped (Signed)
Appointment on 04/29/21 with Dr Charlsie Merles    Thanks!

## 2021-02-15 ENCOUNTER — Encounter: Payer: Self-pay | Admitting: Family Medicine

## 2021-02-17 ENCOUNTER — Other Ambulatory Visit: Payer: Self-pay | Admitting: Family Medicine

## 2021-02-18 ENCOUNTER — Ambulatory Visit: Admit: 2021-02-18 | Payer: MEDICARE

## 2021-02-18 DIAGNOSIS — R29898 Other symptoms and signs involving the musculoskeletal system: Principal | ICD-10-CM

## 2021-02-18 DIAGNOSIS — M79605 Pain in left leg: Principal | ICD-10-CM

## 2021-02-18 DIAGNOSIS — R2689 Other abnormalities of gait and mobility: Principal | ICD-10-CM

## 2021-02-18 DIAGNOSIS — M79604 Pain in right leg: Principal | ICD-10-CM

## 2021-02-18 DIAGNOSIS — I890 Lymphedema, not elsewhere classified: Principal | ICD-10-CM

## 2021-02-18 DIAGNOSIS — L409 Psoriasis, unspecified: Principal | ICD-10-CM

## 2021-02-18 DIAGNOSIS — L52 Erythema nodosum: Principal | ICD-10-CM

## 2021-02-18 DIAGNOSIS — Z9181 History of falling: Principal | ICD-10-CM

## 2021-02-18 DIAGNOSIS — R262 Difficulty in walking, not elsewhere classified: Principal | ICD-10-CM

## 2021-02-18 NOTE — Unmapped (Signed)
[facility] OCCUPATIONAL THERAPY [place]  OUTPATIENT OCCUPATIONAL THERAPY  02/18/2021  Note Type: Treatment Note       Patient Name: Autumn Mcdonald  Date of Birth:1970/08/09  Diagnosis:   Encounter Diagnosis   Name Primary?   ??? Lymphedema Yes     Visit 13    Referring MD:  Ethlyn Gallery     Date OT Care Plan Established or Reviewed-10/19/20    Plan of Care Effective Date: 02/09/2021 - 03/11/2021    Assessment/Plan:    Assessment details:       Pt tolerated all treatment methods fair with no adverse reaction to therapy. Pt's BLE skin integrity was poor+ on this date as skin was noted to be less pink with increased weeping. On this date BLEs were noted to have decreased warmth and be less tender to the touch. Cleaning tolerated with good tolerance.  Pt will be discharged on this date. Goals  Have not been met due to abrupt discharge.          Pt is a 50 year old female with a medical and treatment dx of lymphedema. Pt has been seen for 11 outpatient occupational therapy treatment sessions with good tolerance for all treatment methods. She has made good progress in goals but recently had a set back with additional infection in BLE that we are now addressing. She does have significant infection. Compression bandaging has assisted with weeping which has decreased. Please see goals for updates.  Pt presents with body system impairments including edema, skin integrity that impair her general health, ambulation, ADL, and IADL functions. She would benefit from continued outpatient occupational therapy to address lymphedema in order to reach highest level of function.   Impairments: fall risk, increased edema, uncontrolled swelling, impaired ADLs, decreased skin integrity and decreased mobility  ??    Impairments: fall risk, impaired balance, increased edema, pain, decreased endurance, decreased mobility, impaired ADLs and decreased skin integrity    Personal Factors/Comorbidities: 3    Examination of Body Systems: 3 elements    Clinical Presentation: evolving  Clinical Decision Making: moderate  Prognosis: good prognosis  Positive Prognosis Rationale: age and motivated for treatment.  Negative Prognosis Rationale: insight, chronicity of condition, severity of symptoms, body habitus and balance.    Therapy Goals  Goals:       Goals:      Short Term Goals:  1. Pt's circumference of BLE will be reduced by 1cm at all measurements within  2 weeks at feet and knees.   CURRENT STATUS: Progress made.  Continue goal for mastery.    2. Pt's skin integrity of BLE will improve at ankles in 2 weeks to decrease  potential for infection.  CURRENT STATUS: BLE now decreased due to infection. Continue for mastery.     3. Pt will be educated on self MLD and recall it with 100% accuracy to perform  in the home in 2 weeks.  CURRENT STATUS: Pt has been given information. Goal in progress. Continue for mastery.  4. Pt's skin texture of BLE will improve to a soft, supple state within 2  Weeks.  CURRENT STATUS: Slow improvement. Continue for mastery.    Long Term Goals:  1. Pt's circumference of bilateral lower extremities will be reduced by 2cm at  all appropriate measurements within 4 weeks to increase ADL function.  CURRENT STATUS: Progression. Continue for mastery.  2. Pt will recall all precautions to prevent lymphedema exacerbation at 100% in  4 weeks.  CURRENT  STATUS: GOAL MET  3. Pt will be fitted for compression garments to address BLE edema with  independent donning in 4 weeks.  CURRENT STATUS: Pt has not yet been fitted for garments. She will need them for her upper BLE.   4. Pt will be independent with home management of lymphedema in 4 weeks. Continue for mastery.   CURRENT STATUS: Pt does not yet have the tools for independent management of lymphedema. Continue for mastery.   Plan  Therapy options: will be seen for skilled occupational therapy services  Planned therapy interventions: Compression Bandaging, Compression Pump, Education - Patient, Home Exercise Program, Garment Measurement, Therapeutic Exercises, Therapeutic Activities, Manual Therapy and Manual Lymph Drainage  DME Equipment: compression pump, lymphedema bandages and compression garments.  Frequency: 2x week  Duration in weeks: 4 weeks  Education provided to: patient.  Education provided: Importance of Therapy, HEP, Lymphedema precautions and Manual lymph drainage  Education results: needs reinforcement and needs further instruction.      Total Session Time: 55  Treatment rendered today:         Pt then lay in supine with HOB elevated for comfort. Therapist completed manual lymph drainage pre-treatment to bilateral axilla and then full MLD to BLE.  Therapist placed size F stockinette on BLE with 1 roll of coban on each leg to maintain results of MLD. Therapist also cleaned BLE prior to placing dressings on BLE.          Subjective:   History of Present Illness  Date of Onset: 05/17/2003    Date of Evaluation: 10/19/2020    Reason for Referral/Chief Complaint:       Pt reports that her legs have been swelling since 2005. She was sent to rumatologist in 2014/2015 with an appropriate dx then and it has progressively gotten worse. She has also been getting treatment for psoriasis. Pt has been seen at wound center in [place] for the past 17 weeks. She has not been released by them yet. Pt desires to wear appropriate shoes and to have a better life. She is seeking outpatient lymphedema treatment to address BLE lymphedema.   Subjective:     Pt reports 3/10 pain on this date in BLE. She reports that her legs feel better as it is evident that the infection is now recovering. Therapist and pt discuss that she would benefit the most from wound care to address the weeping in BLE. It has been contained in therapy but has not resolved with oral antibiotics. She is agreeable to this plan and will contact her physician after therapy today. Therapist also suggested that she initiate HHPT to continue her progress.   Quality of life: fair    Pain  Current pain rating: 3  At best pain rating: 0  At worst pain rating: 10  Location: BLE  Quality: needle-like and aching  Relieving factors: elevation  Aggravating factors: walking  Progression: improved  Red flags: night pain and disturbed sleep.  Current functional status: limited walking tolerance, unsteady gait, disturbed sleep, leisure activities, limited standing tolerance, use of assistive device, limited recreation, limited household activities and limited exercise    Precautions and Equipment  Precautions: Fall risk and Cancer history  Equipment Currently Used: Metallurgist, Training and development officer  Social Support  Lives in: two story house  Lives with: family members  Hand dominance: right  Communication Preference: verbal  Barriers to Learning: No Barriers  Work/School: on disability    Diagnostic Tests  Diagnostic Test Comments:       none    Treatments  No previous or current treatments      Patient Goals  Patient goals for therapy: decrease assistance with ADLs, decreased edema, decreased pain, improve fit of clothing, improve fit of shoes, improve independence with dressing, improved ambulation, improved sleep, independence with ADLs/IADLs, obtain compression garment and return to sport/leisure activities        Objective:   Lymphedema:   Treatment:     Lymph node removal: No      Chemotherapy: No      Radiation therapy: No      Previous lymphedema treatment: No      Reoccurrence?: No    Current Management:     Current management details:  CDT, compression placed by therapist    Daytime:  Performs exercise    Nighttime:  Nothing  Contraindications:     Contraindications:  None  Objective:     Location of swelling:  Right, left, ankle, toes, knee, thigh, groin and foot    Fold thickness:  Moderate    Fibrosis: No      Skin assessment:  Discoloration, edema, leathery and moist    Skin assessment comments:  Skin is noted to be bright pink. Very little weeping noted. Peeling skin    Incision:  N/A    Color:  Pink    Temperature:  Normal    Hair growth:  Reduced    Stemmer's sign:  +    Wound: No    Posture/Observations:     Sitting:  Rounded shoulders    Standing:  Forward flexed posture and wide BOS    Sleeping:  Recliner  ROM and Strength:     ROM and Strength findings:  BLE WFL  Tests:     Lower Extremity Functional Scale Score (out of 80):  2  Lymphedema additional information:  Measurements:    RLE:                    12/15/20:         01/14/21:  MTP: 24.5cm        25cm         24cm  Heel: 37cm           36cm          35.5cm  Ankle: 31cm         33.1cm        31.2cm  10cm: 33cm         43.8cm        42.2cm  20cm: 49cm         57.5cm        48.5cm  Knee: 56.3cm       59.5cm        56.5cm  10cm: 84.2cm??      87cm          86.6cm  20cm: 92.8cm       97.5cm       95.5cm     LLE:  MTP: 25.6cm        24.5cm       24cm  Heel: 37.2cm        35.8cm        35.4cm  Ankle: 34cm          33cm          33cm  10cm: 36.3cm       41.5cm  41cm  20cm: 54.5cm       58.5cm       54.1cm  Knee: 69cm           73cm           68.5cm  10cm: 90.2cm       96cm           90.1cm  20cm: 100cm        99.9cm        98cm    *BLE unable to be measured 02/09/21 as BLE too tender to the touch. Updated measurements will be taken once infection resolves.                                I attest that I have reviewed the above information.  Signed: Melvern Banker, OT  02/18/2021 2:49 PM

## 2021-02-18 NOTE — Unmapped (Signed)
[facility] PHYSICAL THERAPY [place]  OUTPATIENT PHYSICAL THERAPY  02/18/2021          Patient Name: Autumn Mcdonald  Date of Birth:[birth year]  Diagnosis:   Encounter Diagnoses   Name Primary?   ??? Lymphedema Yes   ??? Leg weakness, bilateral    ??? Difficulty walking    ??? History of falling      Referring MD:  Ethlyn Gallery,*     Date of Onset of Impairment-No date available  Date PT Care Plan Established or Reviewed-02/03/2021  Date PT Treatment Started-02/03/2021  Plan of Care Effective Date: 02/03/2021-04/05/2021  Visit Number: 2/16    Assessment/Plan:    Assessment details:       *Pt to transition to [facility] PT per request following this session    Pt presents to PT with improved pain compared to last PT session. Tolerated treatment well including progression of postural muscle strengthening exercises in loaded position with out increased pain. Pt also able to increase ambulation distance up to 75 ft using SPC before requiring seated rest period. Pt has most difficulty weight-shifting on unilateral stance limb placing increased strain through opposing lower extremity. Per pt report, transitioning to [facility] PT to decrease burden with transportation and improved convenience. Updated HEP and provided YTB. Pt verbalized positive understanding. Pt will continue to benefit from skilled PT intervention.      Impairments indicating medical necessity and functional limitations include: decreased ROM, strength, impaired gait, mobility and pain. Iimpairments limit patient's ability to participate in normal ADL's, daily household activities. Patient will benefit from skilled PT intervention to address current body structure impairments and activity limitations.   Impairments: core weakness, decreased endurance, decreased mobility, gait deviation, impaired ADLs, impaired bed mobility, postural weakness, pain, obesity, joint restriction, increased edema, impaired transfers, impaired flexibility, impaired balance, fall risk, decreased strength and decreased range of motion    Personal Factors/Comorbidities: 3  Specific Comorbidities: PMH, current medical condition, obesity   Examination of Body Systems: 1-2 elements  Body System: Musculoskeletal, integumentary  Clinical Presentation: stable  Clinical Decision Making: moderate  Prognosis: fair prognosis  Positive Prognosis Rationale: 51.  Negative Prognosis Rationale: Pain Status, medical status/condition, severity of symptoms and balance.  Barriers to therapy: Medical condition, compliance, body habitus  Therapy Goals  Goals:      Short term goals (to be achieved in 4 weeks) Patient will:  1.Be independent and consistent with home program for self-management of symptoms.   2. Improve BLE pain to <5/10 for improved mobility and completion of ADL's.   3. Improve BLE gross strength to MMT 4/5 across all muscle groups to decrease pain and improve functional mobility.  4. Improve standing tolerance > 30 min with no greater than 3/10 pain.   5. Improved static standing balance to Good and > 10 min. for decreased falls risk.  6. Initiate and complete 5 times sit to stand assessment using LRAD.    Long term goals (to be achieved in 8 weeks) Patient will:  1. Initiate and complete using LRAD.  2. Improve BLE pain to <2/10 for improved mobility and completion of ADL's.   3. Improve walking tolerance >20 min with no greater than 3/10 pain.   3. Improve 10 meter gait speed by 2 seconds using LRAD.  4. Improve standing dynamic balance to Good + >5 min.   5. Improve 5 times sit to stand time by 5 seconds from baseline using LRAD.  Plan  Therapy options: will be seen for skilled physical therapy services  Planned therapy interventions: Home Exercise Program, Gait Training, Functional Mobility, Endurance Activites, Education - Patient, Cryotherapy, Manual Therapy, TENS, Therapeutic Activities, Therapeutic Exercises and Transfer Training  DME Equipment: quad cane (Recommend 2ww as needed to assist with balance and mobility).  Frequency: 2x week  Duration in weeks: 8  Education provided to: patient.  Education provided: Surveyor, minerals, DME education, HEP, Treatment options and plan, Posture, Importance of Therapy, Fall prevention and Body awareness  Education results: needs reinforcement.  Communication/Consultation: Initial note sent to Referring Provider.    Total Session Time: 45  Treatment rendered today:       Therapeutic exercises:  -Standing hip flexion with abdominal brace; BUE support x 10 reps B  -Standing hip abduction with abdominal brace; BUE support x 10 reps B  -Standing hip extension with abdominal brace; BUE support x 10 reps B  -Seated LAQ through available range x 15 reps B  -Seated heel-toe raises x 20 reps B  -Seated scapular retraction x 15 reps; 3 sec isometric hold; verbal/physical cues for improved mechanics, progressed to YTB x 15 additional reps  -Seated modified SAPD's YTB x 15 reps for improved posture  -Seated shoulder horizontal adduction YTB x 15 reps  -Seated hip adduction using small ball between knees; 3-5 sec hold x 20 reps  -Seated hip abduction with therapist providing manual resistance for isometric contraction; 3 sec hold x 20 reps  -Ambulation 50 ft; 75 ft using SPC for improved endurance, mobility  -Sit to stand from chair x 3; BUE support    -Pt education regarding benefits of proper footwear, importance of daily physical activity and participation in physical therapy intervention  -Pt education regarding importance of use assistive device with all mobility including transitioning to 2ww during periods of increased pain to decrease risk of falls.  Plan details:      Modalities (heat, ice, ultrasound, electrical stimulation, iontophoresis), as needed for pain management. Massage, mobilization and manual therapy as needed to increase ROM in restricted soft tissues and joints. Therapeutic exercise, Neuromuscular Re-education, and education.        Subjective: History of Present Illness      Date of Evaluation: 02/03/2021    Reason for Referral/Chief Complaint:       Ms. Wall is a pleasant 50 year old female who presents upon referral to PT from Dr. Jake Samples with a medical diagnosis of lymphedema and concerns due to LE weakness and difficulty walking. Patient reports legs have been swelling since 2005 and officially received medical diagnosis in 2016. Patient also has PMH arthritis, BP, fibromyalgia, high BP, osteoporosis, depression/anxiety and obesity.   Subjective:     Pt presents to PT following OT session with BLE's wrapped and non-supportive footwear (flats) donned. Reports improved pain today 5/10 compared to last PT session. Also discussed with pt/OT this will be last PT session in clinic due to pt's preference to transition to [facility] PT for decreased burden and ease of transportation.   Quality of life: fair    Pain  Current pain rating: 5  At best pain rating: 10  At worst pain rating: 10  Location: Bilateral lower extremities, specifically dorsum of lateral R ankle/calf, tender to touch   Quality: aching, burning, sharp and shooting  Relieving factors: heat, lying and when still  Aggravating factors: bending, turning, standing, stairs, lifting and on the move  Pain Related Behaviors: other (Reports increased pain, emotional throughout  evaluation)  Progression: worsening  Red Flags: Recommend follow up with PCP due to cellutlitis, increased pain.  Prior Functional Status:     Functional Limitation(s)-Standing, Walking, Lifting, ADL's, stairs, participation in household and community based activities, pain  Current functional status: use of assistive device, limited standing tolerance, limited lifting, limited exercise, limited bending, limited travel, limited recreation, unsteady gait, limited walking tolerance, limited sitting tolerance and difficulty with transfers    Precautions and Equipment  Precautions: full weight bearing, Fall risk and Osteoporosis  Current Braces/Orthoses: None  Equipment Currently Used: Quad cane: large base and Rolling walker (Pt currently using quad cane; reports has rolling walker but does not use)  Social Support  Lives in: [place] house  Lives with: family members (Son, father)    Communication Preference: verbal  Barriers to Learning: emotional and physical  Work/School: Not currently employed     Diagnostic Tests    Diagnostic Test Comments:       See chart review (imaging) for full report.   Last x-ray hip 2019    Treatments  Previous treatment: corticosteroid injection and occupational therapy  Current treatment: physical therapy      Patient Goals  Patient goals for therapy: return to sport/leisure activities, return to recreational activites, increased strength, increased ROM, improved standing tolerance, improved sleep, improved sitting tolerance, improved balance, improved ambulation, decreased pain and decreased edema        Objective:     Static Posture Assessment   General Observations-Guarded.  Head-Forward and retracted.  Shoulders-Rounded.  Lumbar Spine-Decreased lordosis.   Pelvis-Posterior pelvic tilt  Hip  Hip (Left): Externally rotated and abducted.   Hip (Right): Externally rotated and abducted.   Ankle/Foot  Ankle/Foot (Left): Pronated.   Ankle/Foot (Right): Pronated.     Postural Observations  Seated posture: fair  Standing posture: fair  Correction of posture: has no consistent effect  Additional Postural Observation Details-Pt demonstrates fair static seated balance requiring UE support; increased posterior lean secondary to decreased abdominal strength. Poor static seated balance.    Pt demonstrates fair static standing balance with quad cane in RUE. Poor static standing balance with assistive device. Poor dynamic balance.    Range of Motion  Hip-Active Rom   Left Hip  Flexion: with pain  Extension: with pain  Right Hip  Flexion: with pain  Extension: with pain  Additional Range of Motion Findings:      Limited AROM lumbar flexion, extension and lateral flexion B >50% secondary to pain    Strength/Myotome Testing-  Left Hip   Planes of Motion   Flexion: 3-  Extension: 3  Abduction: 3+  Adduction: 3+  External rotation: 3+  Internal rotation: 3+  Right Hip   Planes of Motion   Flexion: 3-  Extension: 3  Abduction: 3+  Adduction: 3+  External rotation: 3+  Internal rotation: 3+  Left Knee   Flexion: 3+  Extension: 3+  Right Knee   Flexion: 3+  Left Ankle/Foot   Plantar flexion: 4-  Inversion: 4-  Eversion: 4-  Great toe flexion: 4-  Right Ankle/Foot   Dorsiflexion: 3+  Plantar flexion: 4-  Inversion: 4-  Eversion: 4-  Great toe flexion: 4-  Additional Strength Details  Demonstrates significant strength deficits; proximal greater than distal throughout all postural muscles B.   Unable to achieve full AROM hip flexion against gravity secondary to weakness and pain.   Abdominal weakness evident by inability to maintain erect posturing; increased posterior lean on all surfaces in  seated position.    Tests     Functional Assessment   Functional Assessment Comments-10MWT: 30 seconds using quad cane in RUE  5 Times Sit to Stand: Initiates, but pt has increased difficulty transferring from chair today requiring min (A) to mod (A) from therapist and increased time secondary to pain and weakness. Will continue to progress towards this as goal.  : Pt unable to ambulate for 2 consecutive minutes today secondary to pain, fatigue and poor endurance.    Ambulation   Weight-Bearing Status   Distance in feet: 30  Assistive device used: quad cane  Ambulation: Level Surfaces   Ambulation with assistive device: stand by assist  Ambulation without assistive device: unable  Additional Level Surfaces Ambulation Details-Pt ambulates 30 feet with quad cane RUE on level surfaces. Pt unable to ambulate without assistive device today.  Ambulation: Stairs  Additional Stairs Ambulation Details-Not performed today due to increased pain and significant weakness throughout LE's. Pt reports she does not have to perform stairs at home.   Observational Gait   Gait: antalgic   Decreased walking speed, stride length, left swing time, right swing time, left step length and right step length.   Left foot contact pattern: foot flat  Right foot contact pattern: foot flat  Left arm swing: decreased  Right arm swing: decreased  Additional Observational Gait Details-Pt demonstrates very poor and inefficient gait mechanics including decreased cadence, step-length, limited swing phase with decreased hip, knee flexion and AROM dorsiflexion for clearance, foot flat and initial contact. Pt is positioned with hip/forefoot external rotation and mid-foot ankle pronation B. Limited arm swing present.   Quality of Movement During Gait   Pelvis-Posterior pelvic tilt.   Knee-  Knee (Left): Positive increased ER tibial torsion. Negative increased flexion during stance.   Knee (Right): Positive increased ER tibial torsion. Negative increased flexion during stance.                                   I attest that I have reviewed the above information.  SignedMarin Comment, PT  02/18/2021 2:26 PM

## 2021-02-22 ENCOUNTER — Other Ambulatory Visit: Payer: Self-pay | Admitting: Family Medicine

## 2021-02-22 NOTE — Telephone Encounter (Signed)
Patient is requesting a refill of the following medications: Requested Prescriptions   Pending Prescriptions Disp Refills   HYDROcodone-acetaminophen ([place]) 10-325 MG tablet [Pharmacy Med Name: hydrocodone 10 mg-acetaminophen 325 mg tablet] 90 tablet 0    Sig: TAKE 1 TABLET BY MOUTH EVERY 6 HOURS AS NEEDED    Date of patient request: 02/22/2021 Last office visit: 02/10/2021 Date of last refill: 01/29/2021 Last refill amount: 90 tablets  Follow up time period per chart: 03/04/2021

## 2021-03-03 ENCOUNTER — Other Ambulatory Visit: Payer: Self-pay | Admitting: Family Medicine

## 2021-03-03 DIAGNOSIS — F419 Anxiety disorder, unspecified: Secondary | ICD-10-CM

## 2021-03-04 ENCOUNTER — Ambulatory Visit: Payer: Medicare Other | Admitting: Family Medicine

## 2021-03-11 ENCOUNTER — Ambulatory Visit: Admit: 2021-03-11 | Discharge: 2021-03-12 | Payer: MEDICARE

## 2021-03-11 DIAGNOSIS — L03119 Cellulitis of unspecified part of limb: Principal | ICD-10-CM

## 2021-03-11 DIAGNOSIS — I890 Lymphedema, not elsewhere classified: Principal | ICD-10-CM

## 2021-03-11 DIAGNOSIS — M300 Polyarteritis nodosa: Principal | ICD-10-CM

## 2021-03-11 DIAGNOSIS — Z9884 Bariatric surgery status: Secondary | ICD-10-CM | POA: Diagnosis not present

## 2021-03-11 DIAGNOSIS — E785 Hyperlipidemia, unspecified: Secondary | ICD-10-CM | POA: Diagnosis not present

## 2021-03-11 DIAGNOSIS — Z791 Long term (current) use of non-steroidal anti-inflammatories (NSAID): Secondary | ICD-10-CM | POA: Diagnosis not present

## 2021-03-11 DIAGNOSIS — L03115 Cellulitis of right lower limb: Secondary | ICD-10-CM | POA: Diagnosis not present

## 2021-03-11 DIAGNOSIS — F32A Depression, unspecified: Secondary | ICD-10-CM | POA: Diagnosis not present

## 2021-03-11 DIAGNOSIS — L309 Dermatitis, unspecified: Secondary | ICD-10-CM | POA: Diagnosis not present

## 2021-03-11 DIAGNOSIS — Z79899 Other long term (current) drug therapy: Secondary | ICD-10-CM | POA: Diagnosis not present

## 2021-03-11 DIAGNOSIS — L03116 Cellulitis of left lower limb: Secondary | ICD-10-CM | POA: Diagnosis not present

## 2021-03-11 DIAGNOSIS — Z888 Allergy status to other drugs, medicaments and biological substances status: Secondary | ICD-10-CM | POA: Diagnosis not present

## 2021-03-11 DIAGNOSIS — M797 Fibromyalgia: Secondary | ICD-10-CM | POA: Diagnosis not present

## 2021-03-11 DIAGNOSIS — Z23 Encounter for immunization: Secondary | ICD-10-CM | POA: Diagnosis not present

## 2021-03-11 DIAGNOSIS — I10 Essential (primary) hypertension: Secondary | ICD-10-CM | POA: Diagnosis not present

## 2021-03-11 DIAGNOSIS — Z882 Allergy status to sulfonamides status: Secondary | ICD-10-CM | POA: Diagnosis not present

## 2021-03-11 DIAGNOSIS — Z881 Allergy status to other antibiotic agents status: Secondary | ICD-10-CM | POA: Diagnosis not present

## 2021-03-11 DIAGNOSIS — L959 Vasculitis limited to the skin, unspecified: Secondary | ICD-10-CM | POA: Diagnosis not present

## 2021-03-11 DIAGNOSIS — L400 Psoriasis vulgaris: Secondary | ICD-10-CM | POA: Diagnosis not present

## 2021-03-11 DIAGNOSIS — Z886 Allergy status to analgesic agent status: Secondary | ICD-10-CM | POA: Diagnosis not present

## 2021-03-11 LAB — CBC W/ AUTO DIFF
BASOPHILS ABSOLUTE COUNT: 0.1 10*9/L (ref 0.0–0.1)
BASOPHILS RELATIVE PERCENT: 0.8 %
EOSINOPHILS ABSOLUTE COUNT: 0.3 10*9/L (ref 0.0–0.5)
EOSINOPHILS RELATIVE PERCENT: 3 %
HEMATOCRIT: 35.6 % (ref 34.0–44.0)
HEMOGLOBIN: 11.6 g/dL (ref 11.3–14.9)
LYMPHOCYTES ABSOLUTE COUNT: 1 10*9/L — ABNORMAL LOW (ref 1.1–3.6)
LYMPHOCYTES RELATIVE PERCENT: 11.3 %
MEAN CORPUSCULAR HEMOGLOBIN CONC: 32.6 g/dL (ref 32.0–36.0)
MEAN CORPUSCULAR HEMOGLOBIN: 26.7 pg (ref 25.9–32.4)
MEAN CORPUSCULAR VOLUME: 81.9 fL (ref 77.6–95.7)
MEAN PLATELET VOLUME: 6.6 fL — ABNORMAL LOW (ref 6.8–10.7)
MONOCYTES ABSOLUTE COUNT: 1 10*9/L — ABNORMAL HIGH (ref 0.3–0.8)
MONOCYTES RELATIVE PERCENT: 11 %
NEUTROPHILS ABSOLUTE COUNT: 6.4 10*9/L (ref 1.8–7.8)
NEUTROPHILS RELATIVE PERCENT: 73.9 %
PLATELET COUNT: 555 10*9/L — ABNORMAL HIGH (ref 150–450)
RED BLOOD CELL COUNT: 4.34 10*12/L (ref 3.95–5.13)
RED CELL DISTRIBUTION WIDTH: 15.6 % — ABNORMAL HIGH (ref 12.2–15.2)
WBC ADJUSTED: 8.7 10*9/L (ref 3.6–11.2)

## 2021-03-11 LAB — CREATININE
CREATININE: 0.49 mg/dL — ABNORMAL LOW
EGFR CKD-EPI (2021) FEMALE: >90 mL/min/{1.73_m2} (ref >=60)

## 2021-03-11 MED ORDER — CLINDAMYCIN HCL 300 MG CAPSULE
ORAL_CAPSULE | Freq: Four times a day (QID) | ORAL | 0 refills | 10.00000 days | Status: CP
Start: 2021-03-11 — End: 2021-03-21

## 2021-03-11 NOTE — Unmapped (Signed)
Patient Name: Autumn Mcdonald  PCP: ??Frederico Hamman, MD      Chief Compliant: Cutaneous PAN    HPI: Autumn Mcdonald is a 50 y.o. 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 Additional hx of fibromyalgia for which she is taking duloxetine and gabapentin.   Treatment hx:  - Started prednisone taper and then mtx in 11/2014.  - Plaquenil started in 12/2014.   - Started imuran in 07/2015, and titrated up to 150 mg qd w/o sufficient control of her PAN.   - Switched to cellcept 09/2015, titrated up to 1500 mg BID.   - Persistent leg swelling and pain, thought to be due to swelling from prednisone, so tapered off prednisone  - Re-biopsied 08/2016, results consistent with venous stasis disease  vs erythema induratum/nodular vasculitis so started on oral potassium iodide, trental, and receiving intralesional steroid injections starting in November/December 2018  - Due to persistent sx, cellcept discontinued and dapsone started by dermatology. Also received intralesional steroid injections with derm.   -Was evaluated by vascular surgery and no vascular insufficiency was noted, thus her leg swelling has been attributed to lymphedema.   - Plaque psoriasis diagnosed by dermatology 2019. Humira started 08/2017.   - HCQ discontinued 09/2017 due to new dx of psoriasis and unclear benefit for PAN.   - Developed TNF induced psoriasis, so humira discontinued and skyrizi started 03/2019  - Current medication regimen:  skyrizi for psoriasis, pentoxifylline for cutaneous PAN     Interim history:  Presents today for f/u.     She continues to struggle with lymphedema of legs. She has been seen with PT lymphedema clinic for this. About 4 wks ago, started to have more redness in the legs. The redness got worse and the legs got warm. Not swelling as much as she has in the past. She was seen by PCP and dx with cellulitis. Treated with doxycycline x 2 w/o help. She denies any pain today in the legs, but has diffuse desquamation of the R leg with oozing and bleeding. She notes the bleeding just started last night when she was scratching the legs in her sleep.   She has lincomycin listed as an allergy, but states she has taken clindamycin in the past w/o issue. Reviewed records and saw that she has received clindamycin before.    Psoriasis has been well controlled per patient on Skyrizi.  She has 1 psoriatic lesion on her thigh that returns around the time that she is due for her next injection, and resolves after injection.    She is in a wheelchair today for ambulation. She states that at home she sometimes uses a cane, but usually walks without assistive devices in her home, holding onto the walls.    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 This has been difficult for her. She has some support with her son.         ROS??: Attests to the above, otherwise, review of ten system is negative.  ????Record Review: Available records were reviewed, including pertinent office visits, labs, and imaging.      Past Medical and Surgical History:  ??  Patient Active Problem List    Diagnosis Date Noted   ??? Melena 02/16/2017   ??? Chronic narcotic use 09/07/2016   ??? Bleeding stomach ulcer 01/20/2016   ??? Acute blood  loss anemia 01/14/2016   ??? BRBPR (bright red blood per rectum) 01/14/2016   ??? Neuropathy 12/09/2015   ??? Migraine headache 11/28/2014   ??? PAN (polyarteritis nodosa) (CMS-HCC) 11/28/2014   ??? Chronic gout of multiple sites 02/25/2014   ??? Gastroesophageal reflux disease 01/08/2014   ??? Leg pain, lateral 12/11/2012   ??? S/P skin biopsy 10/31/2012   ??? Psoriasis 01/09/2012   ??? Degeneration of intervertebral disc 11/30/2011   ??? Cutaneous fungal infection 11/15/2011   ??? DJD (degenerative joint disease) 10/14/2011   ??? Frequent urination 04/13/2011   ??? [name] (obstructive sleep apnea) 11/16/2010   ??? Elevated blood sugar 08/23/2010   ??? Other abnormal glucose 10/08/2009   ??? Contracture of tendon 08/17/2009   ??? Leg pain, right 11/05/2008   ??? Other malaise and fatigue 09/19/2008   ??? Ingrowing nail 06/17/2008   ??? Adverse drug reaction 04/03/2008   ??? Morbid obesity (CMS-HCC) 01/03/2007   ??? Hyperlipidemia 09/22/2006   ??? Pain in joint, lower leg 09/22/2006   ??? Muscle pain 09/20/2006   ??? Fibromyalgia 09/20/2006   ??? Anxiety 06/28/2006   ??? Hypertension 06/28/2006     Past Medical History:   Diagnosis Date   ??? Allergic     See other notes   ??? Arthritis 05/16/2001   ??? Bleeding gastric ulcer    ??? Cervical cancer (CMS-HCC)    ??? Depression    ??? Disorder of skin or subcutaneous tissue 2008    Diagnosised in   ??? Eczema 05/16/2014   ??? Fibromyalgia    ??? Hypertension    ??? Morbid obesity (CMS-HCC)    ??? Psoriasis Last 2 years     Past Surgical History:   Procedure Laterality Date   ??? CESAREAN SECTION  1994   ??? CHOLECYSTECTOMY  March 1994   ??? GASTRIC BYPASS  January 2014   ??? HYSTERECTOMY  June 1998   ??? SKIN BIOPSY  2017     Allergies:   ??  Allergies   Allergen Reactions   ??? Iohexol Hives     Pt treated with PO benedryl   ??? Keflex [Cephalexin] Shortness Of Breath and Hives   ??? Lincomycin Hcl Hives   ??? Lisinopril Hives     Hives  Hives   ??? Sulfa (Sulfonamide Antibiotics) Swelling and Rash   ??? Sulfamethoxazole-Trimethoprim Hives     REACTION: Rash, throat closed, eyes swelled.  Hives  Hives   ??? Aspirin      REACTION: Hives  High Doses only. Causes rash.  High Doses only. Causes rash.   ??? Naproxen      REACTION: Hives  Eyes well  Eyes well   ??? Niacin      REACTION: Swelling, problems breathing   ??? Benzoin Compound Rash   ??? Pneumococcal 23-Val Ps Vaccine Rash     cellulitis     Current Outpatient Medications:  ??  Current Outpatient Medications   Medication Sig Dispense Refill   ??? ALPRAZolam (XANAX) 0.5 MG tablet Take 0.5 mg by mouth.     ??? ARIPiprazole (ABILIFY) 10 MG tablet Take 10 mg by mouth daily.     ??? armodafiniL (NUVIGIL) 250 mg tablet Take 250 mg by mouth three (3) times a day.      ??? biotin 10 mg Tab 10 mg per 1 tablet, ORAL, Daily, 0 Refill(s)     ??? buPROPion (WELLBUTRIN XL) 150 MG 24 hr tablet Take 150 mg by mouth.     ??? busPIRone (  BUSPAR) 30 MG tablet Take 30 mg by mouth.     ??? calcium citrate (CALCITRATE) 200 mg elem calcium (950 mg) tablet 950 mg per 1 tablet, ORAL, Daily, 60 tablet, 0 Refill(s)     ??? clonazePAM (KLONOPIN) 1 MG tablet TAKE 1 TABLET BY MOUTH EVERY DAY     ??? cyclobenzaprine (FLEXERIL) 10 MG tablet Take 10 mg by mouth once as needed.      ??? diclofenac sodium (VOLTAREN) 1 % gel Apply 4grams topically 4 TIMES DAILY 300 g 5   ??? DULoxetine (CYMBALTA) 60 MG capsule Take 60 mg by mouth daily. 120 mg daily     ??? ferrous sulfate 325 (65 FE) MG tablet Take 1 tablet (325 mg total) by mouth daily with breakfast.  6   ??? furosemide (LASIX) 80 MG tablet 20 mg.     ??? gabapentin (NEURONTIN) 600 MG tablet Take 2 tablets (1,200 mg total) by mouth Three (3) times a day. 180 tablet 11   ??? HYDROcodone-acetaminophen (NORCO 10-325) 10-325 mg per tablet Take 1 tablet by mouth.     ??? MEDICAL SUPPLY ITEM Pneumatic pump for Lymphedema  [facility]   Attn: Bartholomew Crews, Energy manager   Fax: [phone] 1 each 0   ??? nebivoloL (BYSTOLIC) 10 MG tablet Take 10 mg by mouth daily.      ??? nystatin (MYCOSTATIN) 100,000 unit/gram ointment Apply topically Two (2) times a day. 30 g 0   ??? NYSTOP 100,000 unit/gram powder As needed     ??? omeprazole (PRILOSEC) 20 MG capsule Take 1 capsule by mouth daily.     ??? OXcarbazepine (TRILEPTAL) 300 MG tablet Take 300 mg by mouth.     ??? pantoprazole (PROTONIX) 40 MG tablet Take 40 mg by mouth Two (2) times a day. bid     ??? pentoxifylline (TRENTAL) 400 mg CR tablet Take 1 tablet (400 mg total) by mouth Three (3) times a day with a meal. 270 tablet 3   ??? promethazine (PHENERGAN) 25 MG tablet once as needed.      ??? risankizumab-rzaa (SKYRIZI) 150 mg/mL Syrg Inject the contents of 1 syringe (150 mg) under the skin every 12 weeks. 1 mL 0   ??? rosuvastatin (CRESTOR) 20 MG tablet TAKE 1 TABLET BY MOUTH EVERY DAY       No current facility-administered medications for this visit.          PHYSICAL EXAM??  Vitals:    03/11/21 1200   BP: 116/76   BP Site: L Wrist   BP Position: Sitting   Pulse: 74   Temp: 36.6 ??C (97.9 ??F)   TempSrc: Temporal   Weight: (!) 132.3 kg (291 lb 9.6 oz)     Wt Readings from Last 6 Encounters:   03/11/21 (!) 132.3 kg (291 lb 9.6 oz)   10/08/20 (!) 138.2 kg (304 lb 9.6 oz)   09/19/19 (!) 165.6 kg (365 lb)   09/12/18 (!) 156.9 kg (346 lb)   01/10/18 (!) 166 kg (366 lb)   10/04/17 (!) 180.4 kg (397 lb 11.2 oz)       General:   Pleasant 50 y.o.female in no acute distress, obese    Cardiovascular:  Regular rate and rhythm. No murmur, rub, or gallop. b/l LE edema.    Lungs:  Clear to auscultation.Normal respiratory effort.    Musculoskeletal:   Ambulates w/ wheelchair. Hands w/o swelling. Knees with some restricted flexion due to body habitus. Edematous ankles and feet.  Psych:  Appropriate affect and mood   Skin:  B/l shins with erythema and lymphedema b/l LE.  Weeping edema bilaterally.  Lower legs bilaterally with flaking and desquamation/ulceration, bleeding.  See photos taken below with patient's permission.  No psoriatic plaques on exposed skin.                 ??GENERAL SUMMARY/IMPRESSION ??AND RECOMMENDATIONS: ??  ??1.  Possible PAN vs erythema induratum  Continue pentoxifylline 400 mg TID.  Do not suspect that PAN is the etiology for her symptoms today.  Appears more consistent with cellulitis. Dr Scarlette Calico examined patient as well and help develop plan below.    2. Lymphedema  Continue follow-up with lymphedema PT clinic and PCP.    3. Cellulitis of lower extremity, unspecified laterality  Check labs as noted below.  Start clindamycin 300 mg every 6 hours x10 days.  Follow-up with PCP after completion of antibiotics.  If infection worsens or fails to resolve, consider going to ER for IV antibiotic therapy.  - clindamycin (CLEOCIN) 300 MG capsule; Take 1 capsule (300 mg total) by mouth every six (6) hours for 10 days.  Dispense: 40 capsule; Refill: 0  - CBC w/ Differential  - Creatinine          HCM:   - PCV13 Status: 10/04/17  - PPSV 23 Status: 03/17/11, 01/10/18  - Annual Influenza vaccine. Status: UTD per patient  - COVID-19 vaccine: Pfizer 08/15/19, 09/09/19, 01/31/20.  Pfizer bivalent booster 03/11/2021  - Bone health: not on prednisone   - Plaquenil eye exam: no longer taking HCQ  - Contraception: did not discuss         Greater than 30 minutes spent in visit with patient, including pre and postvisit activities.   Return appt in 5 mo as scheduled with Dr Scarlette Calico

## 2021-03-17 ENCOUNTER — Other Ambulatory Visit: Payer: Self-pay | Admitting: Family Medicine

## 2021-03-17 MED ORDER — HYDROCODONE-ACETAMINOPHEN 10-325 MG PO TABS: 1.0000 | ORAL_TABLET | Freq: Four times a day (QID) | ORAL | 0 refills | Status: DC | PRN

## 2021-03-17 NOTE — Telephone Encounter (Signed)
Last seen 02/10/2021 please advise

## 2021-03-22 DIAGNOSIS — D75839 Thrombocytosis: Principal | ICD-10-CM

## 2021-03-22 NOTE — Unmapped (Signed)
Repeat CBC ordered due to elevated platelets. Pt informed of recommendation to recheck in 2-3 weeks via mychart.

## 2021-04-05 ENCOUNTER — Other Ambulatory Visit: Payer: Self-pay

## 2021-04-05 ENCOUNTER — Encounter (HOSPITAL_BASED_OUTPATIENT_CLINIC_OR_DEPARTMENT_OTHER): Payer: Medicare Other | Attending: Physician Assistant | Admitting: Internal Medicine

## 2021-04-05 DIAGNOSIS — L97811 Non-pressure chronic ulcer of other part of right lower leg limited to breakdown of skin: Secondary | ICD-10-CM | POA: Diagnosis not present

## 2021-04-05 DIAGNOSIS — I890 Lymphedema, not elsewhere classified: Secondary | ICD-10-CM | POA: Diagnosis not present

## 2021-04-05 DIAGNOSIS — L97821 Non-pressure chronic ulcer of other part of left lower leg limited to breakdown of skin: Secondary | ICD-10-CM | POA: Insufficient documentation

## 2021-04-05 DIAGNOSIS — M300 Polyarteritis nodosa: Secondary | ICD-10-CM | POA: Insufficient documentation

## 2021-04-05 DIAGNOSIS — M797 Fibromyalgia: Secondary | ICD-10-CM | POA: Diagnosis not present

## 2021-04-05 DIAGNOSIS — I87333 Chronic venous hypertension (idiopathic) with ulcer and inflammation of bilateral lower extremity: Secondary | ICD-10-CM | POA: Diagnosis not present

## 2021-04-05 DIAGNOSIS — I10 Essential (primary) hypertension: Secondary | ICD-10-CM | POA: Insufficient documentation

## 2021-04-05 DIAGNOSIS — M1990 Unspecified osteoarthritis, unspecified site: Secondary | ICD-10-CM | POA: Insufficient documentation

## 2021-04-06 NOTE — Progress Notes (Signed)
[name] ([id number]) Visit Report for 04/05/2021 Allergy List Details Patient Name: Date of Service: [name] 04/05/2021 7:30 A M Medical Record Number: [id number] Patient Account Number: [id number] Date of Birth/Sex: Treating RN: [birth year] (50 y.o. [name] Primary Care Detria Cummings: [name] Other Clinician: Referring Dael Howland: Treating Charels Stambaugh/Extender: Judeth Porch in Treatment: 0 Allergies Active Allergies niacin Reaction: anaphylaxis Severity: Severe sulfamethoxazole Reaction: anaphylaxis Severity: Severe aspirin Reaction: hives Severity: Moderate Bactrim Reaction: hives Severity: Moderate benzoin Reaction: rash Severity: Moderate cephalexin Reaction: hives (has tolerated ceftriaxone and cefepime) Severity: Moderate clindamycin Reaction: hives Severity: Moderate doxycycline Reaction: hives Severity: Moderate iohexol Reaction: hives Severity: Moderate lisinopril Reaction: hives Severity: Moderate naproxen Reaction: hives Severity: Moderate Sulfa (Sulfonamide Antibiotics) Reaction: rash Severity: Moderate Allergy Notes Electronic Signature(s) Signed: 04/06/2021 9:52:13 AM By: Dellie Catholic RN Signed: 04/06/2021 9:52:13 AM By: Dellie Catholic RN Entered By: Dellie Catholic on 04/05/2021 08:03:31 -------------------------------------------------------------------------------- Arrival Information Details Patient Name: Date of Service: Erica Devoid K. 04/05/2021 7:30 A M Medical Record Number: [id number] Patient Account Number: [id number] Date of Birth/Sex: Treating RN: 06-Aug-1970 (50 y.o. [name] Primary Care Serafin Decatur: [name] Other Clinician: Referring Nguyen Todorov: Treating Marcelina Mclaurin/Extender: Judeth Porch in Treatment: 0 Visit Information Patient Arrived: Wheel Chair Arrival Time: 07:56 Transfer Assistance: Manual Patient Identification  Verified: Yes Secondary Verification Process Completed: Yes Patient Requires Transmission-Based Precautions: No Patient Has Alerts: No History Since Last Visit Added or deleted any medications: No Any new allergies or adverse reactions: No Had a fall or experienced change in activities of daily living that may affect risk of falls: No Signs or symptoms of abuse/neglect since last visito No Hospitalized since last visit: No Implantable device outside of the clinic excluding cellular tissue based products placed in the center since last visit: No Has Dressing in Place as Prescribed: Yes Electronic Signature(s) Signed: 04/06/2021 9:52:13 AM By: Dellie Catholic RN Entered By: Dellie Catholic on 04/05/2021 07:57:54 -------------------------------------------------------------------------------- Clinic Level of Care Assessment Details Patient Name: Date of Service: [name] 04/05/2021 7:30 A M Medical Record Number: [id number] Patient Account Number: [id number] Date of Birth/Sex: Treating RN: 1971-01-03 (50 y.o. [name] Primary Care Lekia Nier: [name] Other Clinician: Referring Deshea Pooley: Treating Kemiyah Tarazon/Extender: Judeth Porch in Treatment: 0 Clinic Level of Care Assessment Items TOOL 2 Quantity Score X- 1 0 Use when only an EandM is performed on the INITIAL visit ASSESSMENTS - Nursing Assessment / Reassessment X- 1 20 General Physical Exam (combine w/ comprehensive assessment (listed just below) when performed on new pt. evals) X- 1 25 Comprehensive Assessment (HX, ROS, Risk Assessments, Wounds Hx, etc.) ASSESSMENTS - Wound and Skin A ssessment / Reassessment []  - 0 Simple Wound Assessment / Reassessment - one wound X- 3 5 Complex Wound Assessment / Reassessment - multiple wounds []  - 0 Dermatologic / Skin Assessment (not related to wound area) ASSESSMENTS - Ostomy and/or Continence Assessment and Care []  - 0 Incontinence  Assessment and Management []  - 0 Ostomy Care Assessment and Management (repouching, etc.) PROCESS - Coordination of Care []  - 0 Simple Patient / Family Education for ongoing care X- 1 20 Complex (extensive) Patient / Family Education for ongoing care []  - 0 Staff obtains Programmer, systems, Records, T Results / Process Orders est []  - 0 Staff telephones HHA, Nursing Homes / Clarify orders / etc []  - 0 Routine Transfer to another Facility (non-emergent condition) []  - 0 Routine Hospital Admission (non-emergent condition) []  - 0  New Admissions / Biomedical engineer / Ordering NPWT Apligraf, etc. , []  - 0 Emergency Hospital Admission (emergent condition) []  - 0 Simple Discharge Coordination []  - 0 Complex (extensive) Discharge Coordination PROCESS - Special Needs []  - 0 Pediatric / Minor Patient Management []  - 0 Isolation Patient Management []  - 0 Hearing / Language / Visual special needs []  - 0 Assessment of Community assistance (transportation, D/C planning, etc.) []  - 0 Additional assistance / Altered mentation []  - 0 Support Surface(s) Assessment (bed, cushion, seat, etc.) INTERVENTIONS - Wound Cleansing / Measurement X- 1 5 Wound Imaging (photographs - any number of wounds) []  - 0 Wound Tracing (instead of photographs) []  - 0 Simple Wound Measurement - one wound X- 3 5 Complex Wound Measurement - multiple wounds []  - 0 Simple Wound Cleansing - one wound X- 3 5 Complex Wound Cleansing - multiple wounds INTERVENTIONS - Wound Dressings []  - 0 Small Wound Dressing one or multiple wounds []  - 0 Medium Wound Dressing one or multiple wounds X- 2 20 Large Wound Dressing one or multiple wounds []  - 0 Application of Medications - injection INTERVENTIONS - Miscellaneous []  - 0 External ear exam []  - 0 Specimen Collection (cultures, biopsies, blood, body fluids, etc.) []  - 0 Specimen(s) / Culture(s) sent or taken to Lab for analysis []  - 0 Patient Transfer  (multiple staff / Civil Service fast streamer / Similar devices) []  - 0 Simple Staple / Suture removal (25 or less) []  - 0 Complex Staple / Suture removal (26 or more) []  - 0 Hypo / Hyperglycemic Management (close monitor of Blood Glucose) []  - 0 Ankle / Brachial Index (ABI) - do not check if billed separately Has the patient been seen at the hospital within the last three years: Yes Total Score: 155 Level Of Care: New/Established - Level 4 Electronic Signature(s) Signed: 04/05/2021 4:56:39 PM By: Lorrin Jackson Entered By: Lorrin Jackson on 04/05/2021 09:16:24 -------------------------------------------------------------------------------- Compression Therapy Details Patient Name: Date of Service: Erica Devoid K. 04/05/2021 7:30 A M Medical Record Number: [id number] Patient Account Number: [id number] Date of Birth/Sex: Treating RN: [birth year] (50 y.o. [name] Primary Care Akirah Storck: [name] Other Clinician: Referring Alfio Loescher: Treating Shamel Germond/Extender: Judeth Porch in Treatment: 0 Compression Therapy Performed for Wound Assessment: Wound #14 Left,Circumferential Lower Leg Performed By: Clinician Lorrin Jackson, RN Compression Type: Double Layer Post Procedure Diagnosis Same as Pre-procedure Electronic Signature(s) Signed: 04/06/2021 9:52:13 AM By: Dellie Catholic RN Entered By: Dellie Catholic on 04/05/2021 08:42:29 -------------------------------------------------------------------------------- Compression Therapy Details Patient Name: Date of Service: Erica Devoid K. 04/05/2021 7:30 A M Medical Record Number: [id number] Patient Account Number: [id number] Date of Birth/Sex: Treating RN: Dec 22, 1970 (50 y.o. [name] Primary Care Kade Demicco: [name] Other Clinician: Referring Grazia Taffe: Treating Nasiyah Laverdiere/Extender: Judeth Porch in Treatment: 0 Compression Therapy Performed for Wound Assessment:  Wound #15 Right,Anterior Lower Leg Performed By: Clinician Lorrin Jackson, RN Compression Type: Double Layer Post Procedure Diagnosis Same as Pre-procedure Electronic Signature(s) Signed: 04/06/2021 9:52:13 AM By: Dellie Catholic RN Entered By: Dellie Catholic on 04/05/2021 08:42:29 -------------------------------------------------------------------------------- Compression Therapy Details Patient Name: Date of Service: Erica Devoid K. 04/05/2021 7:30 A M Medical Record Number: [id number] Patient Account Number: [id number] Date of Birth/Sex: Treating RN: Nov 27, 1970 (50 y.o. [name] Primary Care Amberley Hamler: [name] Other Clinician: Referring Ambri Miltner: Treating Shirley Bolle/Extender: Judeth Porch in Treatment: 0 Compression Therapy Performed for Wound Assessment: Wound #16 Right,Posterior Lower Leg Performed By: Clinician Lorrin Jackson, RN Compression  Type: Double Layer Post Procedure Diagnosis Same as Pre-procedure Electronic Signature(s) Signed: 04/06/2021 9:52:13 AM By: Dellie Catholic RN Entered By: Dellie Catholic on 04/05/2021 08:42:29 -------------------------------------------------------------------------------- Encounter Discharge Information Details Patient Name: Date of Service: Erica Devoid K. 04/05/2021 7:30 A M Medical Record Number: [id number] Patient Account Number: [id number] Date of Birth/Sex: Treating RN: 1971/02/23 (50 y.o. [name] Primary Care Heaven Wandell: [name] Other Clinician: Referring Nisa Decaire: Treating Eugina Row/Extender: Judeth Porch in Treatment: 0 Encounter Discharge Information Items Discharge Condition: Stable Ambulatory Status: Wheelchair Discharge Destination: Home Transportation: Private Auto Schedule Follow-up Appointment: Yes Clinical Summary of Care: Provided on 04/05/2021 Form Type Recipient Paper Patient Patient Electronic  Signature(s) Signed: 04/05/2021 9:18:54 AM By: Lorrin Jackson Entered By: Lorrin Jackson on 04/05/2021 09:18:54 -------------------------------------------------------------------------------- Lower Extremity Assessment Details Patient Name: Date of Service: Erica KEILY, LEPP 04/05/2021 7:30 A M Medical Record Number: [id number] Patient Account Number: [id number] Date of Birth/Sex: Treating RN: July 09, 1970 (50 y.o. [name] Primary Care Billyjoe Go: [name] Other Clinician: Referring Chinonso Linker: Treating Jaivian Battaglini/Extender: Judeth Porch in Treatment: 0 Edema Assessment Assessed: Shirlyn Goltz: Yes] Patrice Paradise: Yes] Edema: [Left: Yes] [Right: Yes] Calf Left: Right: Point of Measurement: 24 cm From Medial Instep 51.5 cm 46 cm Ankle Left: Right: Point of Measurement: 8 cm From Medial Instep 31.8 cm 28 cm Vascular Assessment Pulses: Dorsalis Pedis Palpable: [Left:Yes] [Right:Yes] Electronic Signature(s) Signed: 04/05/2021 4:56:39 PM By: Lorrin Jackson Entered By: Lorrin Jackson on 04/05/2021 08:13:58 -------------------------------------------------------------------------------- Multi Wound Chart Details Patient Name: Date of Service: Erica Devoid K. 04/05/2021 7:30 A M Medical Record Number: [id number] Patient Account Number: [id number] Date of Birth/Sex: Treating RN: [birth year] (50 y.o. [name] Primary Care Trevyn Lumpkin: [name] Other Clinician: Referring Blayn Whetsell: Treating Maze Corniel/Extender: Judeth Porch in Treatment: 0 Vital Signs Height(in): Pulse(bpm): 75 Weight(lbs): Blood Pressure(mmHg): 112/65 Body Mass Index(BMI): Temperature(F): 97.8 Respiratory Rate(breaths/min): 18 Photos: Left, Circumferential Lower Leg Right, Anterior Lower Leg Right, Posterior Lower Leg Wound Location: Gradually Appeared Gradually Appeared Gradually Appeared Wounding Event: Lymphedema Lymphedema  Lymphedema Primary Etiology: Lymphedema, Sleep Apnea, Lymphedema, Sleep Apnea, Lymphedema, Sleep Apnea, Comorbid History: Hypertension, Peripheral Venous Hypertension, Peripheral Venous Hypertension, Peripheral Venous Disease, Osteoarthritis, Confinement Disease, Osteoarthritis, Confinement Disease, Osteoarthritis, Confinement Anxiety Anxiety Anxiety 03/03/2021 03/03/2021 03/03/2021 Date Acquired: 0 0 0 Weeks of Treatment: Open Open Open Wound Status: No Yes Yes Clustered Wound: 14x34x0.2 9x10x0.2 7x3x0.2 Measurements L x W x D (cm) 373.85 70.686 16.493 A (cm) : rea 74.77 14.137 3.299 Volume (cm) : 0.00% 0.00% N/A % Reduction in Area: 0.00% 0.00% N/A % Reduction in Volume: Full Thickness Without Exposed Full Thickness Without Exposed Full Thickness Without Exposed Classification: Support Structures Support Structures Support Structures Large Large Large Exudate A mount: Serous Serous Serous Exudate Type: Geophysical data processor Exudate Color: Distinct, outline attached Distinct, outline attached Distinct, outline attached Wound Margin: Medium (34-66%) Medium (34-66%) Small (1-33%) Granulation Amount: Red Red Red Granulation Quality: Medium (34-66%) Medium (34-66%) Large (67-100%) Necrotic Amount: N/A Eschar, Adherent Slough Eschar, Adherent Slough Necrotic Tissue: Fat Layer (Subcutaneous Tissue): Yes Fat Layer (Subcutaneous Tissue): Yes Fat Layer (Subcutaneous Tissue): Yes Exposed Structures: Fascia: No Fascia: No Fascia: No Tendon: No Tendon: No Tendon: No Muscle: No Muscle: No Muscle: No Joint: No Joint: No Joint: No Bone: No Bone: No Bone: No None None None Epithelialization: Compression Therapy Compression Therapy Compression Therapy Procedures Performed: Treatment Notes Electronic Signature(s) Signed: 04/05/2021 9:16:27 AM By: Kalman Shan DO Signed: 04/05/2021 4:56:39 PM By: Lorrin Jackson  Entered By: Kalman Shan on 04/05/2021  08:57:47 -------------------------------------------------------------------------------- Multi-Disciplinary Care Plan Details Patient Name: Date of Service: Erica PHYILLIS, DASCOLI 04/05/2021 7:30 A M Medical Record Number: [id number] Patient Account Number: [id number] Date of Birth/Sex: Treating RN: Jan 19, 1971 (50 y.o. [name] Primary Care Randie Bloodgood: [name] Other Clinician: Referring Sebastien Jackson: Treating Matin Mattioli/Extender: Judeth Porch in Treatment: 0 Active Inactive Venous Leg Ulcer Nursing Diagnoses: Actual venous Insuffiency (use after diagnosis is confirmed) Goals: Patient will maintain optimal edema control Date Initiated: 04/05/2021 Target Resolution Date: 05/03/2021 Goal Status: Active Interventions: Assess peripheral edema status every visit. Compression as ordered Treatment Activities: Therapeutic compression applied : 04/05/2021 Notes: Wound/Skin Impairment Nursing Diagnoses: Impaired tissue integrity Goals: Patient/caregiver will verbalize understanding of skin care regimen Date Initiated: 04/05/2021 Target Resolution Date: 05/03/2021 Goal Status: Active Ulcer/skin breakdown will have a volume reduction of 30% by week 4 Date Initiated: 04/05/2021 Target Resolution Date: 05/03/2021 Goal Status: Active Interventions: Assess patient/caregiver ability to obtain necessary supplies Assess patient/caregiver ability to perform ulcer/skin care regimen upon admission and as needed Assess ulceration(s) every visit Provide education on ulcer and skin care Treatment Activities: Topical wound management initiated : 04/05/2021 Notes: Electronic Signature(s) Signed: 04/05/2021 9:16:05 AM By: Lorrin Jackson Previous Signature: 04/05/2021 8:28:36 AM Version By: Lorrin Jackson Entered By: Lorrin Jackson on 04/05/2021 09:16:05 -------------------------------------------------------------------------------- Pain Assessment  Details Patient Name: Date of Service: Erica Devoid K. 04/05/2021 7:30 A M Medical Record Number: [id number] Patient Account Number: [id number] Date of Birth/Sex: Treating RN: 04-12-1971 (50 y.o. [name] Primary Care Erica Shehan: [name] Other Clinician: Referring Merita Hawks: Treating Dosha Broshears/Extender: Judeth Porch in Treatment: 0 Active Problems Location of Pain Severity and Description of Pain Patient Has Paino No Site Locations Pain Management and Medication Current Pain Management: Electronic Signature(s) Signed: 04/05/2021 4:56:39 PM By: Lorrin Jackson Signed: 04/06/2021 9:52:13 AM By: Dellie Catholic RN Entered By: Dellie Catholic on 04/05/2021 08:39:43 -------------------------------------------------------------------------------- Patient/Caregiver Education Details Patient Name: Date of Service: [name] 11/21/2022andnbsp7:30 A M Medical Record Number: [id number] Patient Account Number: [id number] Date of Birth/Gender: Treating RN: 19-Oct-1970 (50 y.o. [name] Primary Care Physician: [name] Other Clinician: Referring Physician: Treating Physician/Extender: Judeth Porch in Treatment: 0 Education Assessment Education Provided To: Patient Education Topics Provided Venous: Methods: Explain/Verbal, Printed Responses: State content correctly Wound/Skin Impairment: Methods: Explain/Verbal, Printed Responses: State content correctly Electronic Signature(s) Signed: 04/05/2021 4:56:39 PM By: Lorrin Jackson Entered By: Lorrin Jackson on 04/05/2021 08:28:54 -------------------------------------------------------------------------------- Wound Assessment Details Patient Name: Date of Service: Erica Devoid K. 04/05/2021 7:30 A M Medical Record Number: [id number] Patient Account Number: [id number] Date of Birth/Sex: Treating RN: July 18, 1970 (50 y.o. [name] Primary Care Renne Platts: [name] Other Clinician: Referring Tashiba Timoney: Treating Brena Windsor/Extender: Judeth Porch in Treatment: 0 Wound Status Wound Number: 14 Primary Lymphedema Etiology: Wound Location: Left, Circumferential Lower Leg Wound Open Wounding Event: Gradually Appeared Status: Date Acquired: 03/03/2021 Comorbid Lymphedema, Sleep Apnea, Hypertension, Peripheral Venous Weeks Of Treatment: 0 History: Disease, Osteoarthritis, Confinement Anxiety Clustered Wound: No Photos Wound Measurements Length: (cm) 14 Width: (cm) 34 Depth: (cm) 0.2 Area: (cm) 373.85 Volume: (cm) 74.77 % Reduction in Area: 0% % Reduction in Volume: 0% Epithelialization: None Tunneling: No Undermining: No Wound Description Classification: Full Thickness Without Exposed Support Structures Wound Margin: Distinct, outline attached Exudate Amount: Large Exudate Type: Serous Exudate Color: amber Foul Odor After Cleansing: No Slough/Fibrino Yes Wound Bed Granulation Amount: Medium (34-66%) Exposed Structure Granulation Quality:  Red Fascia Exposed: No Necrotic Amount: Medium (34-66%) Fat Layer (Subcutaneous Tissue) Exposed: Yes Tendon Exposed: No Muscle Exposed: No Joint Exposed: No Bone Exposed: No Treatment Notes Wound #14 (Lower Leg) Wound Laterality: Left, Circumferential Cleanser Soap and Water Discharge Instruction: May shower and wash wound with dial antibacterial soap and water prior to dressing change. Wound Cleanser Discharge Instruction: Cleanse the wound with wound cleanser prior to applying a clean dressing using gauze sponges, not tissue or cotton balls. Peri-Wound Care Topical Primary Dressing KerraCel Ag Gelling Fiber Dressing, 4x5 in (silver alginate) Discharge Instruction: Apply silver alginate to wound bed as instructed Secondary Dressing ABD Pad, 8x10 Discharge Instruction: Apply over primary dressing as directed. Zetuvit  Plus 4x8 in Discharge Instruction: Apply over primary dressing as directed. Secured With Compression Wrap CoFlex TLC XL 2-layer Compression System 4x7 (in/yd) Discharge Instruction: Apply CoFlex 2-layer compression as directed. (alt for 4 layer) Compression Stockings Add-Ons Electronic Signature(s) Signed: 04/05/2021 4:56:39 PM By: Lorrin Jackson Entered By: Lorrin Jackson on 04/05/2021 08:21:52 -------------------------------------------------------------------------------- Wound Assessment Details Patient Name: Date of Service: Erica Devoid K. 04/05/2021 7:30 A M Medical Record Number: [id number] Patient Account Number: [id number] Date of Birth/Sex: Treating RN: Sep 09, 1970 (50 y.o. [name] Primary Care Ansel Ferrall: [name] Other Clinician: Referring Muaaz Brau: Treating Stephen Turnbaugh/Extender: Judeth Porch in Treatment: 0 Wound Status Wound Number: 15 Primary Lymphedema Etiology: Wound Location: Right, Anterior Lower Leg Wound Open Wounding Event: Gradually Appeared Status: Date Acquired: 03/03/2021 Comorbid Lymphedema, Sleep Apnea, Hypertension, Peripheral Venous Weeks Of Treatment: 0 History: Disease, Osteoarthritis, Confinement Anxiety Clustered Wound: Yes Photos Wound Measurements Length: (cm) 9 Width: (cm) 10 Depth: (cm) 0.2 Area: (cm) 70.686 Volume: (cm) 14.137 % Reduction in Area: 0% % Reduction in Volume: 0% Epithelialization: None Tunneling: No Undermining: No Wound Description Classification: Full Thickness Without Exposed Support Structures Wound Margin: Distinct, outline attached Exudate Amount: Large Exudate Type: Serous Exudate Color: amber Foul Odor After Cleansing: No Slough/Fibrino Yes Wound Bed Granulation Amount: Medium (34-66%) Exposed Structure Granulation Quality: Red Fascia Exposed: No Necrotic Amount: Medium (34-66%) Fat Layer (Subcutaneous Tissue) Exposed: Yes Necrotic Quality: Eschar,  Adherent Slough Tendon Exposed: No Muscle Exposed: No Joint Exposed: No Bone Exposed: No Treatment Notes Wound #15 (Lower Leg) Wound Laterality: Right, Anterior Cleanser Soap and Water Discharge Instruction: May shower and wash wound with dial antibacterial soap and water prior to dressing change. Wound Cleanser Discharge Instruction: Cleanse the wound with wound cleanser prior to applying a clean dressing using gauze sponges, not tissue or cotton balls. Peri-Wound Care Topical Primary Dressing KerraCel Ag Gelling Fiber Dressing, 4x5 in (silver alginate) Discharge Instruction: Apply silver alginate to wound bed as instructed Secondary Dressing ABD Pad, 8x10 Discharge Instruction: Apply over primary dressing as directed. Zetuvit Plus 4x8 in Discharge Instruction: Apply over primary dressing as directed. Secured With Compression Wrap CoFlex TLC XL 2-layer Compression System 4x7 (in/yd) Discharge Instruction: Apply CoFlex 2-layer compression as directed. (alt for 4 layer) Compression Stockings Add-Ons Electronic Signature(s) Signed: 04/05/2021 4:56:39 PM By: Lorrin Jackson Entered By: Lorrin Jackson on 04/05/2021 08:19:09 -------------------------------------------------------------------------------- Wound Assessment Details Patient Name: Date of Service: Erica Devoid K. 04/05/2021 7:30 A M Medical Record Number: [id number] Patient Account Number: [id number] Date of Birth/Sex: Treating RN: [birth year] (50 y.o. [name] Primary Care Clois Treanor: [name] Other Clinician: Referring Greg Eckrich: Treating Avery Klingbeil/Extender: Judeth Porch in Treatment: 0 Wound Status Wound Number: 16 Primary Lymphedema Etiology: Wound Location: Right, Posterior Lower Leg Wound Open Wounding Event: Gradually Appeared  Status: Date Acquired: 03/03/2021 Comorbid Lymphedema, Sleep Apnea, Hypertension, Peripheral Venous Weeks Of Treatment: 0 History:  Disease, Osteoarthritis, Confinement Anxiety Clustered Wound: Yes Photos Wound Measurements Length: (cm) 7 Width: (cm) 3 Depth: (cm) 0.2 Area: (cm) 16.493 Volume: (cm) 3.299 % Reduction in Area: % Reduction in Volume: Epithelialization: None Tunneling: No Undermining: No Wound Description Classification: Full Thickness Without Exposed Support Structures Wound Margin: Distinct, outline attached Exudate Amount: Large Exudate Type: Serous Exudate Color: amber Foul Odor After Cleansing: No Slough/Fibrino No Wound Bed Granulation Amount: Small (1-33%) Exposed Structure Granulation Quality: Red Fascia Exposed: No Necrotic Amount: Large (67-100%) Fat Layer (Subcutaneous Tissue) Exposed: Yes Necrotic Quality: Eschar, Adherent Slough Tendon Exposed: No Muscle Exposed: No Joint Exposed: No Bone Exposed: No Treatment Notes Wound #16 (Lower Leg) Wound Laterality: Right, Posterior Cleanser Soap and Water Discharge Instruction: May shower and wash wound with dial antibacterial soap and water prior to dressing change. Wound Cleanser Discharge Instruction: Cleanse the wound with wound cleanser prior to applying a clean dressing using gauze sponges, not tissue or cotton balls. Peri-Wound Care Topical Primary Dressing KerraCel Ag Gelling Fiber Dressing, 4x5 in (silver alginate) Discharge Instruction: Apply silver alginate to wound bed as instructed Secondary Dressing ABD Pad, 8x10 Discharge Instruction: Apply over primary dressing as directed. Zetuvit Plus 4x8 in Discharge Instruction: Apply over primary dressing as directed. Secured With Compression Wrap CoFlex TLC XL 2-layer Compression System 4x7 (in/yd) Discharge Instruction: Apply CoFlex 2-layer compression as directed. (alt for 4 layer) Compression Stockings Add-Ons Electronic Signature(s) Signed: 04/05/2021 4:56:39 PM By: Lorrin Jackson Entered By: Lorrin Jackson on 04/05/2021  08:21:36 -------------------------------------------------------------------------------- Vitals Details Patient Name: Date of Service: Erica Devoid K. 04/05/2021 7:30 A M Medical Record Number: [id number] Patient Account Number: [id number] Date of Birth/Sex: Treating RN: 12-09-1970 (50 y.o. [name] Primary Care Eleanna Theilen: [name] Other Clinician: Referring Reilly Blades: Treating Dandrae Kustra/Extender: Judeth Porch in Treatment: 0 Vital Signs Time Taken: 08:03 Temperature (F): 97.8 Pulse (bpm): 85 Respiratory Rate (breaths/min): 18 Blood Pressure (mmHg): 112/65 Reference Range: 80 - 120 mg / dl Electronic Signature(s) Signed: 04/06/2021 9:52:13 AM By: Dellie Catholic RN Entered By: Dellie Catholic on 04/05/2021 08:03:20

## 2021-04-06 NOTE — Progress Notes (Signed)
[name] ([id number]) Visit Report for 04/05/2021 Abuse/Suicide Risk Screen Details Patient Name: Date of Service: [name], [name] 04/05/2021 7:30 A M Medical Record Number: [id number] Patient Account Number: [id number] Date of Birth/Sex: Treating RN: [birth year] (50 y.o. [name] Primary Care Kaeden Depaz: [name] Other Clinician: Referring Malikah Principato: Treating Valecia Beske/Extender: Judeth Porch in Treatment: 0 Abuse/Suicide Risk Screen Items Answer ABUSE RISK SCREEN: Has anyone close to you tried to hurt or harm you recentlyo No Do you feel uncomfortable with anyone in your familyo No Has anyone forced you do things that you didnt want to doo No Electronic Signature(s) Signed: 04/05/2021 4:56:39 PM By: Lorrin Jackson Signed: 04/06/2021 9:52:13 AM By: Dellie Catholic RN Entered By: Dellie Catholic on 04/05/2021 08:05:31 -------------------------------------------------------------------------------- Activities of Daily Living Details Patient Name: Date of Service: Erica Devoid K. 04/05/2021 7:30 A M Medical Record Number: [id number] Patient Account Number: [id number] Date of Birth/Sex: Treating RN: [birth year] (50 y.o. [name] Primary Care Kaesyn Johnston: [name] Other Clinician: Referring Alter Moss: Treating Nera Haworth/Extender: Judeth Porch in Treatment: 0 Activities of Daily Living Items Answer Activities of Daily Living (Please select one for each item) Drive Automobile Completely Able T Medications ake Completely Able Use T elephone Completely Able Care for Appearance Completely Able Use T oilet Completely Able Bath / Shower Completely Able Dress Self Completely Able Feed Self Completely Able Walk Completely Able Get In / Out Bed Completely Able Housework Completely Able Prepare Meals Completely Able Handle Money Completely Able Shop for Self Need Assistance Electronic  Signature(s) Signed: 04/05/2021 4:56:39 PM By: Lorrin Jackson Signed: 04/06/2021 9:52:13 AM By: Dellie Catholic RN Entered By: Dellie Catholic on 04/05/2021 08:08:07 -------------------------------------------------------------------------------- Education Screening Details Patient Name: Date of Service: Erica Devoid K. 04/05/2021 7:30 A M Medical Record Number: [id number] Patient Account Number: [id number] Date of Birth/Sex: Treating RN: 1970-06-25 (50 y.o. [name] Primary Care Freida Nebel: [name] Other Clinician: Referring Gaspar Fowle: Treating Camora Tremain/Extender: Judeth Porch in Treatment: 0 Primary Learner Assessed: Patient Learning Preferences/Education Level/Primary Language Learning Preference: Explanation, Demonstration, Video, Communication Board Preferred Language: English Cognitive Barrier Language Barrier: No Translator Needed: No Memory Deficit: No Emotional Barrier: No Cultural/Religious Beliefs Affecting Medical Care: No Physical Barrier Impaired Vision: No Impaired Hearing: No Decreased Hand dexterity: No Knowledge/Comprehension Knowledge Level: High Comprehension Level: High Ability to understand written instructions: High Ability to understand verbal instructions: High Motivation Anxiety Level: Calm Cooperation: Cooperative Education Importance: Acknowledges Need Interest in Health Problems: Asks Questions Perception: Coherent Willingness to Engage in Self-Management High Activities: Readiness to Engage in Self-Management High Activities: Electronic Signature(s) Signed: 04/05/2021 4:56:39 PM By: Lorrin Jackson Signed: 04/06/2021 9:52:13 AM By: Dellie Catholic RN Entered By: Dellie Catholic on 04/05/2021 08:09:20 -------------------------------------------------------------------------------- Fall Risk Assessment Details Patient Name: Date of Service: Erica Devoid K. 04/05/2021 7:30 A M Medical Record  Number: [id number] Patient Account Number: [id number] Date of Birth/Sex: Treating RN: [birth year] (50 y.o. [name] Primary Care Tiaria Biby: [name] Other Clinician: Referring Kitara Hebb: Treating Sederick Jacobsen/Extender: Judeth Porch in Treatment: 0 Fall Risk Assessment Items Have you had 2 or more falls in the last 12 monthso 0 No Have you had any fall that resulted in injury in the last 12 monthso 0 No FALLS RISK SCREEN History of falling - immediate or within 3 months 0 No Secondary diagnosis (Do you have 2 or more medical diagnoseso) 0 No Ambulatory aid None/bed rest/wheelchair/nurse 0 No Crutches/cane/walker 0 No Furniture 0  No Intravenous therapy Access/Saline/Heparin Lock 0 No Gait/Transferring Normal/ bed rest/ wheelchair 0 No Weak (short steps with or without shuffle, stooped but able to lift head while walking, may seek 0 No support from furniture) Impaired (short steps with shuffle, may have difficulty arising from chair, head down, impaired 0 No balance) Mental Status Oriented to own ability 0 No Electronic Signature(s) Signed: 04/05/2021 4:56:39 PM By: Lorrin Jackson Signed: 04/06/2021 9:52:13 AM By: Dellie Catholic RN Entered By: Dellie Catholic on 04/05/2021 08:09:48 -------------------------------------------------------------------------------- Foot Assessment Details Patient Name: Date of Service: Erica Devoid K. 04/05/2021 7:30 A M Medical Record Number: [id number] Patient Account Number: [id number] Date of Birth/Sex: Treating RN: [birth year] (50 y.o. [name] Primary Care [name]: [name] Other Clinician: Referring Laquanta Hummel: Treating Glendi Mohiuddin/Extender: Judeth Porch in Treatment: 0 Foot Assessment Items Site Locations + = Sensation present, - = Sensation absent, C = Callus, U = Ulcer R = Redness, W = Warmth, M = Maceration, PU = Pre-ulcerative lesion F = Fissure, S =  Swelling, D = Dryness Assessment Right: Left: Other Deformity: No No Prior Foot Ulcer: No No Prior Amputation: No No Charcot Joint: No No Ambulatory Status: Ambulatory Without Help Gait: Unsteady Electronic Signature(s) Signed: 04/05/2021 4:56:39 PM By: Lorrin Jackson Signed: 04/06/2021 9:52:13 AM By: Dellie Catholic RN Entered By: Dellie Catholic on 04/05/2021 08:12:42 -------------------------------------------------------------------------------- Nutrition Risk Screening Details Patient Name: Date of Service: Erica Devoid K. 04/05/2021 7:30 A M Medical Record Number: [id number] Patient Account Number: [id number] Date of Birth/Sex: Treating RN: 10/21/1970 (50 y.o. [name] Primary Care Rowyn Mustapha: [name] Other Clinician: Referring Gabriell Casimir: Treating Gaylord Seydel/Extender: Judeth Porch in Treatment: 0 Height (in): Weight (lbs): Body Mass Index (BMI): Nutrition Risk Screening Items Score Screening NUTRITION RISK SCREEN: I have an illness or condition that made me change the kind and/or amount of food I eat 0 No I eat fewer than two meals per day 3 Yes I eat few fruits and vegetables, or milk products 0 No I have three or more drinks of beer, liquor or wine almost every day 0 No I have tooth or mouth problems that make it hard for me to eat 0 No I don't always have enough money to buy the food I need 0 No I eat alone most of the time 0 No I take three or more different prescribed or over-the-counter drugs a day 1 Yes Without wanting to, I have lost or gained 10 pounds in the last six months 0 No I am not always physically able to shop, cook and/or feed myself 0 No Nutrition Protocols Good Risk Protocol Moderate Risk Protocol High Risk Proctocol Risk Level: Moderate Risk Score: 4 Electronic Signature(s) Signed: 04/05/2021 4:56:39 PM By: Lorrin Jackson Signed: 04/06/2021 9:52:13 AM By: Dellie Catholic RN Entered By: Dellie Catholic on 04/05/2021 08:10:56

## 2021-04-06 NOTE — Progress Notes (Signed)
[name] ([id number]) Visit Report for 04/05/2021 Chief Complaint Document Details Patient Name: Date of Service: [name] 04/05/2021 7:30 A M Medical Record Number: [id number] Patient Account Number: [id number] Date of Birth/Sex: Treating RN: Dec 20, 1970 (50 y.o. [name] Primary Care Provider: [name] Other Clinician: Referring Provider: Treating Provider/Extender: Judeth Porch in Treatment: 0 Information Obtained from: Patient Chief Complaint Bilateral lower extremity wounds, left greater than right Electronic Signature(s) Signed: 04/05/2021 9:16:27 AM By: Kalman Shan DO Entered By: Kalman Shan on 04/05/2021 08:57:59 -------------------------------------------------------------------------------- HPI Details Patient Name: Date of Service: Erica Devoid K. 04/05/2021 7:30 A M Medical Record Number: [id number] Patient Account Number: [id number] Date of Birth/Sex: Treating RN: [birth year] (50 y.o. [name] Primary Care Provider: [name] Other Clinician: Referring Provider: Treating Provider/Extender: Judeth Porch in Treatment: 0 History of Present Illness HPI Description: Admission 04/05/2021 [name] is a 50 year old female with a past medical history of polyarteritis nodosa, chronic venous insufficiency/lymphedema , Morbid obesity and [name] that presents to the clinic for reoccurring bilateral lower extremity weeping and wounds. She reports following with the lymphedema clinic in [place] and doing well until she developed weeping and open wounds to her lower extremities bilaterally 1 to 2 months ago. She states she was discharged from the lymphedema clinic due to this. She states being prescribed clindamycin finishing this 1 week ago. She currently denies signs of infection. She denies increased pain. She is not using her juxta light compressions and states she is not  able to use her compression pumps due to soreness in her legs. Electronic Signature(s) Signed: 04/05/2021 9:16:27 AM By: Kalman Shan DO Entered By: Kalman Shan on 04/05/2021 09:10:22 -------------------------------------------------------------------------------- Physical Exam Details Patient Name: Date of Service: Erica Devoid K. 04/05/2021 7:30 A M Medical Record Number: [id number] Patient Account Number: [id number] Date of Birth/Sex: Treating RN: 1971-04-24 (50 y.o. [name] Primary Care Provider: [name] Other Clinician: Referring Provider: Treating Provider/Extender: Judeth Porch in Treatment: 0 Constitutional respirations regular, non-labored and within target range for patient.. Cardiovascular 2+ dorsalis pedis/posterior tibialis pulses. Psychiatric pleasant and cooperative. Notes Bilateral lower extremities with scattered open wounds limited to skin breakdown. 2+ pitting edema to the knees bilaterally. Venous stasis dermatitis bilaterally. Electronic Signature(s) Signed: 04/05/2021 9:16:27 AM By: Kalman Shan DO Entered By: Kalman Shan on 04/05/2021 09:12:44 -------------------------------------------------------------------------------- Physician Orders Details Patient Name: Date of Service: Erica Devoid K. 04/05/2021 7:30 A M Medical Record Number: [id number] Patient Account Number: [id number] Date of Birth/Sex: Treating RN: October 31, 1970 (50 y.o. [name] Primary Care Provider: [name] Other Clinician: Referring Provider: Treating Provider/Extender: Judeth Porch in Treatment: 0 Verbal / Phone Orders: No Diagnosis Coding Follow-up Appointments ppointment in 1 week. - with Dr. Heber [place] Return A Bathing/ Shower/ Hygiene May shower with protection but do not get wound dressing(s) wet. Edema Control - Lymphedema / SCD / Other Lymphedema Pumps. Use Lymphedema  pumps on leg(s) 2-3 times a day for 45-60 minutes. If wearing any wraps or hose, do not remove them. Continue exercising as instructed. Elevate legs to the level of the heart or above for 30 minutes daily and/or when sitting, a frequency of: Avoid standing for long periods of time. Wound Treatment Wound #14 - Lower Leg Wound Laterality: Left, Circumferential Cleanser: Soap and Water 1 x Per Week/30 Days Discharge Instructions: May shower and wash wound with dial antibacterial soap and water prior to dressing change.  Cleanser: Wound Cleanser 1 x Per Week/30 Days Discharge Instructions: Cleanse the wound with wound cleanser prior to applying a clean dressing using gauze sponges, not tissue or cotton balls. Prim Dressing: KerraCel Ag Gelling Fiber Dressing, 4x5 in (silver alginate) 1 x Per Week/30 Days ary Discharge Instructions: Apply silver alginate to wound bed as instructed Secondary Dressing: ABD Pad, 8x10 1 x Per Week/30 Days Discharge Instructions: Apply over primary dressing as directed. Secondary Dressing: Zetuvit Plus 4x8 in 1 x Per Week/30 Days Discharge Instructions: Apply over primary dressing as directed. Compression Wrap: CoFlex TLC XL 2-layer Compression System 4x7 (in/yd) 1 x Per Week/30 Days Discharge Instructions: Apply CoFlex 2-layer compression as directed. (alt for 4 layer) Wound #15 - Lower Leg Wound Laterality: Right, Anterior Cleanser: Soap and Water 1 x Per Week/30 Days Discharge Instructions: May shower and wash wound with dial antibacterial soap and water prior to dressing change. Cleanser: Wound Cleanser 1 x Per Week/30 Days Discharge Instructions: Cleanse the wound with wound cleanser prior to applying a clean dressing using gauze sponges, not tissue or cotton balls. Prim Dressing: KerraCel Ag Gelling Fiber Dressing, 4x5 in (silver alginate) 1 x Per Week/30 Days ary Discharge Instructions: Apply silver alginate to wound bed as instructed Secondary Dressing: ABD  Pad, 8x10 1 x Per Week/30 Days Discharge Instructions: Apply over primary dressing as directed. Secondary Dressing: Zetuvit Plus 4x8 in 1 x Per Week/30 Days Discharge Instructions: Apply over primary dressing as directed. Compression Wrap: CoFlex TLC XL 2-layer Compression System 4x7 (in/yd) 1 x Per Week/30 Days Discharge Instructions: Apply CoFlex 2-layer compression as directed. (alt for 4 layer) Wound #16 - Lower Leg Wound Laterality: Right, Posterior Cleanser: Soap and Water 1 x Per Week/30 Days Discharge Instructions: May shower and wash wound with dial antibacterial soap and water prior to dressing change. Cleanser: Wound Cleanser 1 x Per Week/30 Days Discharge Instructions: Cleanse the wound with wound cleanser prior to applying a clean dressing using gauze sponges, not tissue or cotton balls. Prim Dressing: KerraCel Ag Gelling Fiber Dressing, 4x5 in (silver alginate) 1 x Per Week/30 Days ary Discharge Instructions: Apply silver alginate to wound bed as instructed Secondary Dressing: ABD Pad, 8x10 1 x Per Week/30 Days Discharge Instructions: Apply over primary dressing as directed. Secondary Dressing: Zetuvit Plus 4x8 in 1 x Per Week/30 Days Discharge Instructions: Apply over primary dressing as directed. Compression Wrap: CoFlex TLC XL 2-layer Compression System 4x7 (in/yd) 1 x Per Week/30 Days Discharge Instructions: Apply CoFlex 2-layer compression as directed. (alt for 4 layer) Electronic Signature(s) Signed: 04/05/2021 9:18:12 AM By: Lorrin Jackson Signed: 04/05/2021 10:26:52 AM By: Kalman Shan DO Previous Signature: 04/05/2021 9:15:52 AM Version By: Lorrin Jackson Previous Signature: 04/05/2021 9:16:27 AM Version By: Kalman Shan DO Entered By: Lorrin Jackson on 04/05/2021 09:18:12 -------------------------------------------------------------------------------- Problem List Details Patient Name: Date of Service: Erica Devoid K. 04/05/2021 7:30 A M Medical Record  Number: [id number] Patient Account Number: [id number] Date of Birth/Sex: Treating RN: 08-27-1970 (50 y.o. [name] Primary Care Provider: [name] Other Clinician: Referring Provider: Treating Provider/Extender: Judeth Porch in Treatment: 0 Active Problems ICD-10 Encounter Code Description Active Date MDM Diagnosis I87.333 Chronic venous hypertension (idiopathic) with ulcer and inflammation of 04/05/2021 No Yes bilateral lower extremity L97.821 Non-pressure chronic ulcer of other part of left lower leg limited to breakdown 04/05/2021 No Yes of skin L97.811 Non-pressure chronic ulcer of other part of right lower leg limited to breakdown 04/05/2021 No Yes of skin I89.0 Lymphedema, not  elsewhere classified 04/05/2021 No Yes M30.0 Polyarteritis nodosa 04/05/2021 No Yes Inactive Problems Resolved Problems Electronic Signature(s) Signed: 04/05/2021 9:16:27 AM By: Kalman Shan DO Entered By: Kalman Shan on 04/05/2021 08:57:29 -------------------------------------------------------------------------------- Progress Note Details Patient Name: Date of Service: Erica Orson Gear K. 04/05/2021 7:30 A M Medical Record Number: [id number] Patient Account Number: [id number] Date of Birth/Sex: Treating RN: 04-02-1971 (50 y.o. [name] Primary Care Provider: [name] Other Clinician: Referring Provider: Treating Provider/Extender: Judeth Porch in Treatment: 0 Subjective Chief Complaint Information obtained from Patient Bilateral lower extremity wounds, left greater than right History of Present Illness (HPI) Admission 04/05/2021 [name] is a 50 year old female with a past medical history of polyarteritis nodosa, chronic venous insufficiency/lymphedema , Morbid obesity and [name] that presents to the clinic for reoccurring bilateral lower extremity weeping and wounds. She reports following with  the lymphedema clinic in [place] and doing well until she developed weeping and open wounds to her lower extremities bilaterally 1 to 2 months ago. She states she was discharged from the lymphedema clinic due to this. She states being prescribed clindamycin finishing this 1 week ago. She currently denies signs of infection. She denies increased pain. She is not using her juxta light compressions and states she is not able to use her compression pumps due to soreness in her legs. Patient History Information obtained from Patient. Allergies niacin (Severity: Severe, Reaction: anaphylaxis), sulfamethoxazole (Severity: Severe, Reaction: anaphylaxis), aspirin (Severity: Moderate, Reaction: hives), Bactrim (Severity: Moderate, Reaction: hives), benzoin (Severity: Moderate, Reaction: rash), cephalexin (Severity: Moderate, Reaction: hives (has tolerated ceftriaxone and cefepime)), clindamycin (Severity: Moderate, Reaction: hives), doxycycline (Severity: Moderate, Reaction: hives), iohexol (Severity: Moderate, Reaction: hives), lisinopril (Severity: Moderate, Reaction: hives), naproxen (Severity: Moderate, Reaction: hives), Sulfa (Sulfonamide Antibiotics) (Severity: Moderate, Reaction: rash) Family History Cancer - Father,Maternal Grandparents, Diabetes - Mother, Heart Disease - Father, Hypertension - Mother, Lung Disease - Mother, No family history of Hereditary Spherocytosis. Social History Never smoker, Marital Status - Single, Alcohol Use - Never, Drug Use - No History, Caffeine Use - Rarely. Medical History Hematologic/Lymphatic Patient has history of Lymphedema - lower legs Respiratory Patient has history of Sleep Apnea Cardiovascular Patient has history of Hypertension, Peripheral Venous Disease Musculoskeletal Patient has history of Osteoarthritis Psychiatric Patient has history of Confinement Anxiety Medical A Surgical History  Notes nd Cardiovascular Hyperlipidemia Musculoskeletal polyarthritis nodosa, fibromyalgia Review of Systems (ROS) Integumentary (Skin) Complains or has symptoms of Wounds. Objective Constitutional respirations regular, non-labored and within target range for patient.. Vitals Time Taken: 8:03 AM, Temperature: 97.8 F, Pulse: 85 bpm, Respiratory Rate: 18 breaths/min, Blood Pressure: 112/65 mmHg. Cardiovascular 2+ dorsalis pedis/posterior tibialis pulses. Psychiatric pleasant and cooperative. General Notes: Bilateral lower extremities with scattered open wounds limited to skin breakdown. 2+ pitting edema to the knees bilaterally. Venous stasis dermatitis bilaterally. Integumentary (Hair, Skin) Wound #14 status is Open. Original cause of wound was Gradually Appeared. The date acquired was: 03/03/2021. The wound is located on the Left,Circumferential Lower Leg. The wound measures 14cm length x 34cm width x 0.2cm depth; 373.85cm^2 area and 74.77cm^3 volume. There is Fat Layer (Subcutaneous Tissue) exposed. There is no tunneling or undermining noted. There is a large amount of serous drainage noted. The wound margin is distinct with the outline attached to the wound base. There is medium (34-66%) red granulation within the wound bed. There is a medium (34-66%) amount of necrotic tissue within the wound bed. Wound #15 status is Open. Original cause of wound was Gradually Appeared. The date  acquired was: 03/03/2021. The wound is located on the Right,Anterior Lower Leg. The wound measures 9cm length x 10cm width x 0.2cm depth; 70.686cm^2 area and 14.137cm^3 volume. There is Fat Layer (Subcutaneous Tissue) exposed. There is no tunneling or undermining noted. There is a large amount of serous drainage noted. The wound margin is distinct with the outline attached to the wound base. There is medium (34-66%) red granulation within the wound bed. There is a medium (34-66%) amount of necrotic tissue  within the wound bed including Eschar and Adherent Slough. Wound #16 status is Open. Original cause of wound was Gradually Appeared. The date acquired was: 03/03/2021. The wound is located on the Right,Posterior Lower Leg. The wound measures 7cm length x 3cm width x 0.2cm depth; 16.493cm^2 area and 3.299cm^3 volume. There is Fat Layer (Subcutaneous Tissue) exposed. There is no tunneling or undermining noted. There is a large amount of serous drainage noted. The wound margin is distinct with the outline attached to the wound base. There is small (1-33%) red granulation within the wound bed. There is a large (67-100%) amount of necrotic tissue within the wound bed including Eschar and Adherent Slough. Assessment Active Problems ICD-10 Chronic venous hypertension (idiopathic) with ulcer and inflammation of bilateral lower extremity Non-pressure chronic ulcer of other part of left lower leg limited to breakdown of skin Non-pressure chronic ulcer of other part of right lower leg limited to breakdown of skin Lymphedema, not elsewhere classified Polyarteritis nodosa Patient presents with a 64-month history of nonhealing wounds to her lower extremities bilaterally. She has a history of chronic venous insufficiency and lymphedema. She has followed in our clinic before for this issue. ABIs were 0.96 on the right and 1.05 on the left. There are no signs of infection on exam. She just finished a course of clindamycin for cellulitis. At this time I recommended silver alginate under compression therapy. Follow-up in 1 week. Procedures Wound #14 Pre-procedure diagnosis of Wound #14 is a Lymphedema located on the Left,Circumferential Lower Leg . There was a Double Layer Compression Therapy Procedure by Lorrin Jackson, RN. Post procedure Diagnosis Wound #14: Same as Pre-Procedure Wound #15 Pre-procedure diagnosis of Wound #15 is a Lymphedema located on the Right,Anterior Lower Leg . There was a Double Layer  Compression Therapy Procedure by Lorrin Jackson, RN. Post procedure Diagnosis Wound #15: Same as Pre-Procedure Wound #16 Pre-procedure diagnosis of Wound #16 is a Lymphedema located on the Right,Posterior Lower Leg . There was a Double Layer Compression Therapy Procedure by Lorrin Jackson, RN. Post procedure Diagnosis Wound #16: Same as Pre-Procedure Plan Follow-up Appointments: Return Appointment in 1 week. - with Dr. Heber [place] Bathing/ Shower/ Hygiene: May shower with protection but do not get wound dressing(s) wet. Edema Control - Lymphedema / SCD / Other: Lymphedema Pumps. Use Lymphedema pumps on leg(s) 2-3 times a day for 45-60 minutes. If wearing any wraps or hose, do not remove them. Continue exercising as instructed. Elevate legs to the level of the heart or above for 30 minutes daily and/or when sitting, a frequency of: Avoid standing for long periods of time. WOUND #14: - Lower Leg Wound Laterality: Left, Circumferential Cleanser: Soap and Water 1 x Per Week/30 Days Discharge Instructions: May shower and wash wound with dial antibacterial soap and water prior to dressing change. Cleanser: Wound Cleanser 1 x Per Week/30 Days Discharge Instructions: Cleanse the wound with wound cleanser prior to applying a clean dressing using gauze sponges, not tissue or cotton balls. Prim Dressing: KerraCel Ag Gelling Fiber  Dressing, 4x5 in (silver alginate) 1 x Per Week/30 Days ary Discharge Instructions: Apply silver alginate to wound bed as instructed Secondary Dressing: ABD Pad, 8x10 1 x Per Week/30 Days Discharge Instructions: Apply over primary dressing as directed. Secondary Dressing: Zetuvit Plus 4x8 in 1 x Per Week/30 Days Discharge Instructions: Apply over primary dressing as directed. Com pression Wrap: CoFlex TLC XL 2-layer Compression System 4x7 (in/yd) 1 x Per Week/30 Days Discharge Instructions: Apply CoFlex 2-layer compression as directed. (alt for 4 layer) WOUND #15: - Lower  Leg Wound Laterality: Right, Anterior Cleanser: Soap and Water 1 x Per Week/30 Days Discharge Instructions: May shower and wash wound with dial antibacterial soap and water prior to dressing change. Cleanser: Wound Cleanser 1 x Per Week/30 Days Discharge Instructions: Cleanse the wound with wound cleanser prior to applying a clean dressing using gauze sponges, not tissue or cotton balls. Prim Dressing: KerraCel Ag Gelling Fiber Dressing, 4x5 in (silver alginate) 1 x Per Week/30 Days ary Discharge Instructions: Apply silver alginate to wound bed as instructed Secondary Dressing: ABD Pad, 8x10 1 x Per Week/30 Days Discharge Instructions: Apply over primary dressing as directed. Secondary Dressing: Zetuvit Plus 4x8 in 1 x Per Week/30 Days Discharge Instructions: Apply over primary dressing as directed. Com pression Wrap: CoFlex TLC XL 2-layer Compression System 4x7 (in/yd) 1 x Per Week/30 Days Discharge Instructions: Apply CoFlex 2-layer compression as directed. (alt for 4 layer) WOUND #16: - Lower Leg Wound Laterality: Right, Posterior Cleanser: Soap and Water 1 x Per Week/30 Days Discharge Instructions: May shower and wash wound with dial antibacterial soap and water prior to dressing change. Cleanser: Wound Cleanser 1 x Per Week/30 Days Discharge Instructions: Cleanse the wound with wound cleanser prior to applying a clean dressing using gauze sponges, not tissue or cotton balls. Prim Dressing: KerraCel Ag Gelling Fiber Dressing, 4x5 in (silver alginate) 1 x Per Week/30 Days ary Discharge Instructions: Apply silver alginate to wound bed as instructed Secondary Dressing: ABD Pad, 8x10 1 x Per Week/30 Days Discharge Instructions: Apply over primary dressing as directed. Secondary Dressing: Zetuvit Plus 4x8 in 1 x Per Week/30 Days Discharge Instructions: Apply over primary dressing as directed. Com pression Wrap: CoFlex TLC XL 2-layer Compression System 4x7 (in/yd) 1 x Per Week/30  Days Discharge Instructions: Apply CoFlex 2-layer compression as directed. (alt for 4 layer) 1. Silver alginate under 2 layer Coflex 2. Follow-up in 1 week 3. Encouraged lymphedema pump use Electronic Signature(s) Signed: 04/05/2021 9:16:27 AM By: Kalman Shan DO Entered By: Kalman Shan on 04/05/2021 09:15:47 -------------------------------------------------------------------------------- HxROS Details Patient Name: Date of Service: Erica Devoid K. 04/05/2021 7:30 A M Medical Record Number: [id number] Patient Account Number: [id number] Date of Birth/Sex: Treating RN: [birth year] (50 y.o. [name] Primary Care Provider: [name] Other Clinician: Referring Provider: Treating Provider/Extender: Judeth Porch in Treatment: 0 Information Obtained From Patient Integumentary (Skin) Complaints and Symptoms: Positive for: Wounds Hematologic/Lymphatic Medical History: Positive for: Lymphedema - lower legs Respiratory Medical History: Positive for: Sleep Apnea Cardiovascular Medical History: Positive for: Hypertension; Peripheral Venous Disease Past Medical History Notes: Hyperlipidemia Musculoskeletal Medical History: Positive for: Osteoarthritis Past Medical History Notes: polyarthritis nodosa, fibromyalgia Psychiatric Medical History: Positive for: Confinement Anxiety Immunizations Pneumococcal Vaccine: Received Pneumococcal Vaccination: Yes Received Pneumococcal Vaccination On or After 60th Birthday: No Implantable Devices None Family and Social History Cancer: Yes - Father,Maternal Grandparents; Diabetes: Yes - Mother; Heart Disease: Yes - Father; Hereditary Spherocytosis: No; Hypertension: Yes - Mother; Lung Disease: Yes -  Mother; Never smoker; Marital Status - Single; Alcohol Use: Never; Drug Use: No History; Caffeine Use: Rarely; Financial Concerns: No; Food, Clothing or Shelter Needs: No; Support System Lacking:  No; Transportation Concerns: No Electronic Signature(s) Signed: 04/05/2021 9:16:27 AM By: Kalman Shan DO Signed: 04/05/2021 4:56:39 PM By: Lorrin Jackson Signed: 04/06/2021 9:52:13 AM By: Dellie Catholic RN Entered By: Dellie Catholic on 04/05/2021 08:04:18 -------------------------------------------------------------------------------- [place] Details Patient Name: Date of Service: [name] 04/05/2021 Medical Record Number: [id number] Patient Account Number: [id number] Date of Birth/Sex: Treating RN: [birth year] (50 y.o. [name] Primary Care Provider: [name] Other Clinician: Referring Provider: Treating Provider/Extender: Judeth Porch in Treatment: 0 Diagnosis Coding ICD-10 Codes Code Description [phone] Chronic venous hypertension (idiopathic) with ulcer and inflammation of bilateral lower extremity L97.821 Non-pressure chronic ulcer of other part of left lower leg limited to breakdown of skin L97.811 Non-pressure chronic ulcer of other part of right lower leg limited to breakdown of skin I89.0 Lymphedema, not elsewhere classified M30.0 Polyarteritis nodosa Facility Procedures CPT4: Code [id number] Description: 14 - WOUND CARE VISIT-LEV 4 EST PT Modifier: 25 Quantity: 1 CPT4: [id number] Description: 81 BILATERAL: Application of multi-layer venous compression system; leg (below knee), including ankle and t. Modifier: Quantity: 1 Physician Procedures : CPT4 Code Description Modifier [id number] - WC PHYS LEVEL 3 - EST PT ICD-10 Diagnosis Description I87.333 Chronic venous hypertension (idiopathic) with ulcer and inflammation of bilateral lower extremity L97.821 Non-pressure chronic ulcer of other  part of left lower leg limited to breakdown of skin L97.811 Non-pressure chronic ulcer of other part of right lower leg limited to breakdown of skin I89.0 Lymphedema, not elsewhere classified Quantity:  1 Electronic Signature(s) Signed: 04/05/2021 9:16:33 AM By: Lorrin Jackson Signed: 04/05/2021 10:26:52 AM By: Kalman Shan DO Previous Signature: 04/05/2021 9:16:27 AM Version By: Kalman Shan DO Entered By: Lorrin Jackson on 04/05/2021 09:16:32

## 2021-04-09 ENCOUNTER — Other Ambulatory Visit: Payer: Self-pay | Admitting: Family Medicine

## 2021-04-09 NOTE — Telephone Encounter (Signed)
Patient is requesting a refill of the following medications: Requested Prescriptions   Pending Prescriptions Disp Refills   HYDROcodone-acetaminophen ([place]) 10-325 MG tablet [Pharmacy Med Name: hydrocodone 10 mg-acetaminophen 325 mg tablet] 90 tablet 0    Sig: TAKE 1 TABLET BY MOUTH EVERY 6 HOURS AS NEEDED    Date of patient request: 04/09/21 Last office visit: 02/10/21 Date of last refill: 03/17/21 Last refill amount: 90tab Follow up time period per chart: PRN

## 2021-04-12 ENCOUNTER — Other Ambulatory Visit: Payer: Self-pay

## 2021-04-12 ENCOUNTER — Encounter (HOSPITAL_BASED_OUTPATIENT_CLINIC_OR_DEPARTMENT_OTHER): Payer: Medicare Other | Admitting: Internal Medicine

## 2021-04-12 DIAGNOSIS — I87333 Chronic venous hypertension (idiopathic) with ulcer and inflammation of bilateral lower extremity: Secondary | ICD-10-CM | POA: Diagnosis not present

## 2021-04-12 DIAGNOSIS — M300 Polyarteritis nodosa: Secondary | ICD-10-CM | POA: Diagnosis not present

## 2021-04-12 DIAGNOSIS — L97821 Non-pressure chronic ulcer of other part of left lower leg limited to breakdown of skin: Secondary | ICD-10-CM | POA: Diagnosis not present

## 2021-04-12 DIAGNOSIS — I10 Essential (primary) hypertension: Secondary | ICD-10-CM | POA: Diagnosis not present

## 2021-04-12 DIAGNOSIS — M797 Fibromyalgia: Secondary | ICD-10-CM | POA: Diagnosis not present

## 2021-04-12 DIAGNOSIS — L97811 Non-pressure chronic ulcer of other part of right lower leg limited to breakdown of skin: Secondary | ICD-10-CM | POA: Diagnosis not present

## 2021-04-12 DIAGNOSIS — M1990 Unspecified osteoarthritis, unspecified site: Secondary | ICD-10-CM | POA: Diagnosis not present

## 2021-04-12 DIAGNOSIS — I890 Lymphedema, not elsewhere classified: Secondary | ICD-10-CM | POA: Diagnosis not present

## 2021-04-12 NOTE — Progress Notes (Signed)
Erica, Macias ([id number]) Visit Report for 04/12/2021 Chief Complaint Document Details Patient Name: Date of Service: [name], [name] 04/12/2021 1:00 PM Medical Record Number: [id number] Patient Account Number: [id number] Date of Birth/Sex: Treating RN: 1971-02-24 (50 y.o. [name] Primary Care Provider: [name] Other Clinician: Referring Provider: Treating Provider/Extender: Judeth Porch in Treatment: 1 Information Obtained from: Patient Chief Complaint Bilateral lower extremity wounds, left greater than right Electronic Signature(s) Signed: 04/12/2021 2:05:34 PM By: Kalman Shan DO Entered By: Kalman Shan on 04/12/2021 13:53:18 -------------------------------------------------------------------------------- HPI Details Patient Name: Date of Service: Erica Orson Gear K. 04/12/2021 1:00 PM Medical Record Number: [id number] Patient Account Number: [id number] Date of Birth/Sex: Treating RN: 17-Mar-1971 (50 y.o. [name] Primary Care Provider: [name] Other Clinician: Referring Provider: Treating Provider/Extender: Judeth Porch in Treatment: 1 History of Present Illness HPI Description: Admission 04/05/2021 [name] is a 50 year old female with a past medical history of polyarteritis nodosa, chronic venous insufficiency/lymphedema , Morbid obesity and [name] that presents to the clinic for reoccurring bilateral lower extremity weeping and wounds. She reports following with the lymphedema clinic in [place] and doing well until she developed weeping and open wounds to her lower extremities bilaterally 1 to 2 months ago. She states she was discharged from the lymphedema clinic due to this. She states being prescribed clindamycin finishing this 1 week ago. She currently denies signs of infection. She denies increased pain. She is not using her juxta light compressions and states she is not  able to use her compression pumps due to soreness in her legs. 11/28; patient presents for 1 week follow-up. She has no issues or complaints today. She denies signs of infection. Electronic Signature(s) Signed: 04/12/2021 2:05:34 PM By: Kalman Shan DO Entered By: Kalman Shan on 04/12/2021 14:03:35 -------------------------------------------------------------------------------- Physical Exam Details Patient Name: Date of Service: Erica Devoid K. 04/12/2021 1:00 PM Medical Record Number: [id number] Patient Account Number: [id number] Date of Birth/Sex: Treating RN: 1970-06-27 (50 y.o. [name] Primary Care Provider: Other Clinician: [name] Referring Provider: Treating Provider/Extender: Judeth Porch in Treatment: 1 Constitutional respirations regular, non-labored and within target range for patient.. Cardiovascular 2+ dorsalis pedis/posterior tibialis pulses. Psychiatric pleasant and cooperative. Notes Bilateral lower extremities with scattered open wounds limited to skin breakdown. 2+ pitting edema to the knees bilaterally. Venous stasis dermatitis bilaterally. Electronic Signature(s) Signed: 04/12/2021 2:05:34 PM By: Kalman Shan DO Entered By: Kalman Shan on 04/12/2021 14:03:53 -------------------------------------------------------------------------------- Physician Orders Details Patient Name: Date of Service: Erica Devoid K. 04/12/2021 1:00 PM Medical Record Number: [id number] Patient Account Number: [id number] Date of Birth/Sex: Treating RN: 1971/04/23 (50 y.o. [name], Meta.Reding Primary Care Provider: [name] Other Clinician: Referring Provider: Treating Provider/Extender: Judeth Porch in Treatment: 1 Verbal / Phone Orders: No Diagnosis Coding ICD-10 Coding Code Description [phone] Chronic venous hypertension (idiopathic) with ulcer and inflammation of bilateral lower  extremity L97.821 Non-pressure chronic ulcer of other part of left lower leg limited to breakdown of skin L97.811 Non-pressure chronic ulcer of other part of right lower leg limited to breakdown of skin I89.0 Lymphedema, not elsewhere classified M30.0 Polyarteritis nodosa Follow-up Appointments ppointment in 1 week. - with Dr. Heber [place] Return A Bathing/ Shower/ Hygiene May shower with protection but do not get wound dressing(s) wet. Edema Control - Lymphedema / SCD / Other Lymphedema Pumps. Use Lymphedema pumps on leg(s) 2-3 times a day for 45-60 minutes. If wearing any wraps or hose,  do not remove them. Continue exercising as instructed. - Continue to use over the compression wraps. Elevate legs to the level of the heart or above for 30 minutes daily and/or when sitting, a frequency of: Avoid standing for long periods of time. Exercise regularly Wound Treatment Electronic Signature(s) Signed: 04/12/2021 2:05:34 PM By: Kalman Shan DO Entered By: Kalman Shan on 04/12/2021 14:04:04 -------------------------------------------------------------------------------- Problem List Details Patient Name: Date of Service: Erica Devoid K. 04/12/2021 1:00 PM Medical Record Number: [id number] Patient Account Number: [id number] Date of Birth/Sex: Treating RN: [birth year] (50 y.o. [name], Meta.Reding Primary Care Provider: [name] Other Clinician: Referring Provider: Treating Provider/Extender: Judeth Porch in Treatment: 1 Active Problems ICD-10 Encounter Code Description Active Date MDM Diagnosis I87.333 Chronic venous hypertension (idiopathic) with ulcer and inflammation of 04/05/2021 No Yes bilateral lower extremity L97.821 Non-pressure chronic ulcer of other part of left lower leg limited to breakdown 04/05/2021 No Yes of skin L97.811 Non-pressure chronic ulcer of other part of right lower leg limited to breakdown 04/05/2021 No Yes of skin I89.0  Lymphedema, not elsewhere classified 04/05/2021 No Yes M30.0 Polyarteritis nodosa 04/05/2021 No Yes Inactive Problems Resolved Problems Electronic Signature(s) Signed: 04/12/2021 2:05:34 PM By: Kalman Shan DO Entered By: Kalman Shan on 04/12/2021 13:53:02 -------------------------------------------------------------------------------- Progress Note Details Patient Name: Date of Service: Erica Orson Gear K. 04/12/2021 1:00 PM Medical Record Number: [id number] Patient Account Number: [id number] Date of Birth/Sex: Treating RN: January 10, 1971 (50 y.o. [name] Primary Care Provider: [name] Other Clinician: Referring Provider: Treating Provider/Extender: Judeth Porch in Treatment: 1 Subjective Chief Complaint Information obtained from Patient Bilateral lower extremity wounds, left greater than right History of Present Illness (HPI) Admission 04/05/2021 Ms. Tynslee Bowlds is a 51 year old female with a past medical history of polyarteritis nodosa, chronic venous insufficiency/lymphedema , Morbid obesity and [name] that presents to the clinic for reoccurring bilateral lower extremity weeping and wounds. She reports following with the lymphedema clinic in [place] and doing well until she developed weeping and open wounds to her lower extremities bilaterally 1 to 2 months ago. She states she was discharged from the lymphedema clinic due to this. She states being prescribed clindamycin finishing this 1 week ago. She currently denies signs of infection. She denies increased pain. She is not using her juxta light compressions and states she is not able to use her compression pumps due to soreness in her legs. 11/28; patient presents for 1 week follow-up. She has no issues or complaints today. She denies signs of infection. Patient History Information obtained from Patient. Family History Cancer - Father,Maternal Grandparents, Diabetes - Mother, Heart  Disease - Father, Hypertension - Mother, Lung Disease - Mother, No family history of Hereditary Spherocytosis. Social History Never smoker, Marital Status - Single, Alcohol Use - Never, Drug Use - No History, Caffeine Use - Rarely. Medical History Hematologic/Lymphatic Patient has history of Lymphedema - lower legs Respiratory Patient has history of Sleep Apnea Cardiovascular Patient has history of Hypertension, Peripheral Venous Disease Musculoskeletal Patient has history of Osteoarthritis Psychiatric Patient has history of Confinement Anxiety Medical A Surgical History Notes nd Cardiovascular Hyperlipidemia Musculoskeletal polyarthritis nodosa, fibromyalgia Objective Constitutional respirations regular, non-labored and within target range for patient.. Vitals Time Taken: 1:10 PM, Temperature: 98.5 F, Pulse: 73 bpm, Respiratory Rate: 20 breaths/min, Blood Pressure: 123/70 mmHg. Cardiovascular 2+ dorsalis pedis/posterior tibialis pulses. Psychiatric pleasant and cooperative. General Notes: Bilateral lower extremities with scattered open wounds limited to skin breakdown. 2+ pitting edema to the knees  bilaterally. Venous stasis dermatitis bilaterally. Integumentary (Hair, Skin) Wound #14 status is Open. Original cause of wound was Gradually Appeared. The date acquired was: 03/03/2021. The wound has been in treatment 1 weeks. The wound is located on the Left,Circumferential Lower Leg. The wound measures 22cm length x 31.5cm width x 0.1cm depth; 544.281cm^2 area and 54.428cm^3 volume. There is Fat Layer (Subcutaneous Tissue) exposed. There is no tunneling or undermining noted. There is a large amount of serous drainage noted. The wound margin is distinct with the outline attached to the wound base. There is medium (34-66%) red granulation within the wound bed. There is a medium (34-66%) amount of necrotic tissue within the wound bed including Adherent Slough. Wound #15 status is  Open. Original cause of wound was Gradually Appeared. The date acquired was: 03/03/2021. The wound has been in treatment 1 weeks. The wound is located on the Right,Anterior Lower Leg. The wound measures 4cm length x 11cm width x 0.1cm depth; 34.558cm^2 area and 3.456cm^3 volume. There is Fat Layer (Subcutaneous Tissue) exposed. There is no tunneling or undermining noted. There is a large amount of serous drainage noted. The wound margin is distinct with the outline attached to the wound base. There is large (67-100%) red granulation within the wound bed. There is a small (1-33%) amount of necrotic tissue within the wound bed including Adherent Slough. Wound #16 status is Open. Original cause of wound was Gradually Appeared. The date acquired was: 03/03/2021. The wound has been in treatment 1 weeks. The wound is located on the Right,Posterior Lower Leg. The wound measures 5.2cm length x 3cm width x 0.1cm depth; 12.252cm^2 area and 1.225cm^3 volume. There is Fat Layer (Subcutaneous Tissue) exposed. There is no tunneling or undermining noted. There is a large amount of serous drainage noted. The wound margin is distinct with the outline attached to the wound base. There is large (67-100%) red granulation within the wound bed. There is a small (1-33%) amount of necrotic tissue within the wound bed including Adherent Slough. Assessment Active Problems ICD-10 Chronic venous hypertension (idiopathic) with ulcer and inflammation of bilateral lower extremity Non-pressure chronic ulcer of other part of left lower leg limited to breakdown of skin Non-pressure chronic ulcer of other part of right lower leg limited to breakdown of skin Lymphedema, not elsewhere classified Polyarteritis nodosa Patient's wounds are stable. No signs of infection. I recommended continuing with silver alginate under compression. Procedures Wound #14 Pre-procedure diagnosis of Wound #14 is a Lymphedema located on the  Left,Circumferential Lower Leg . There was a Four Layer Compression Therapy Procedure by Deon Pilling, RN. Post procedure Diagnosis Wound #14: Same as Pre-Procedure Wound #15 Pre-procedure diagnosis of Wound #15 is a Lymphedema located on the Right,Anterior Lower Leg . There was a Four Layer Compression Therapy Procedure by Deon Pilling, RN. Post procedure Diagnosis Wound #15: Same as Pre-Procedure Wound #16 Pre-procedure diagnosis of Wound #16 is a Lymphedema located on the Right,Posterior Lower Leg . There was a Four Layer Compression Therapy Procedure by Deon Pilling, RN. Post procedure Diagnosis Wound #16: Same as Pre-Procedure Plan Follow-up Appointments: Return Appointment in 1 week. - with Dr. Heber [place] Bathing/ Shower/ Hygiene: May shower with protection but do not get wound dressing(s) wet. Edema Control - Lymphedema / SCD / Other: Lymphedema Pumps. Use Lymphedema pumps on leg(s) 2-3 times a day for 45-60 minutes. If wearing any wraps or hose, do not remove them. Continue exercising as instructed. - Continue to use over the compression wraps. Elevate legs to the  level of the heart or above for 30 minutes daily and/or when sitting, a frequency of: Avoid standing for long periods of time. Exercise regularly 1. Silver alginate under 4-layer compression 2. Follow-up in 1 week Electronic Signature(s) Signed: 04/12/2021 2:05:34 PM By: Kalman Shan DO Entered By: Kalman Shan on 04/12/2021 14:04:54 -------------------------------------------------------------------------------- HxROS Details Patient Name: Date of Service: Erica [name], Otila Kluver K. 04/12/2021 1:00 PM Medical Record Number: [id number] Patient Account Number: [id number] Date of Birth/Sex: Treating RN: Apr 15, 1971 (50 y.o. [name] Primary Care Provider: [name] Other Clinician: Referring Provider: Treating Provider/Extender: Judeth Porch in Treatment: 1 Information  Obtained From Patient Hematologic/Lymphatic Medical History: Positive for: Lymphedema - lower legs Respiratory Medical History: Positive for: Sleep Apnea Cardiovascular Medical History: Positive for: Hypertension; Peripheral Venous Disease Past Medical History Notes: Hyperlipidemia Musculoskeletal Medical History: Positive for: Osteoarthritis Past Medical History Notes: polyarthritis nodosa, fibromyalgia Psychiatric Medical History: Positive for: Confinement Anxiety Immunizations Pneumococcal Vaccine: Received Pneumococcal Vaccination: Yes Received Pneumococcal Vaccination On or After 60th Birthday: No Implantable Devices None Family and Social History Cancer: Yes - Father,Maternal Grandparents; Diabetes: Yes - Mother; Heart Disease: Yes - Father; Hereditary Spherocytosis: No; Hypertension: Yes - Mother; Lung Disease: Yes - Mother; Never smoker; Marital Status - Single; Alcohol Use: Never; Drug Use: No History; Caffeine Use: Rarely; Financial Concerns: No; Food, Clothing or Shelter Needs: No; Support System Lacking: No; Transportation Concerns: No Electronic Signature(s) Signed: 04/12/2021 2:05:34 PM By: Kalman Shan DO Signed: 04/12/2021 6:07:33 PM By: Lorrin Jackson Entered By: Kalman Shan on 04/12/2021 14:03:42 -------------------------------------------------------------------------------- SuperBill Details Patient Name: Date of Service: [name] 04/12/2021 Medical Record Number: [id number] Patient Account Number: [id number] Date of Birth/Sex: Treating RN: [birth year] (50 y.o. [name], Meta.Reding Primary Care Provider: [name] Other Clinician: Referring Provider: Treating Provider/Extender: Judeth Porch in Treatment: 1 Diagnosis Coding ICD-10 Codes Code Description [phone] Chronic venous hypertension (idiopathic) with ulcer and inflammation of bilateral lower extremity L97.821 Non-pressure chronic ulcer of other  part of left lower leg limited to breakdown of skin L97.811 Non-pressure chronic ulcer of other part of right lower leg limited to breakdown of skin I89.0 Lymphedema, not elsewhere classified M30.0 Polyarteritis nodosa Facility Procedures CPT4 Code: [id number] Description: (Facility Use Only) [phone] - APPLY [facility] RT LEG Modifier: Quantity: 1 Physician Procedures : CPT4 Code Description Modifier [id number] - WC PHYS LEVEL 3 - EST PT ICD-10 Diagnosis Description I87.333 Chronic venous hypertension (idiopathic) with ulcer and inflammation of bilateral lower extremity L97.821 Non-pressure chronic ulcer of other  part of left lower leg limited to breakdown of skin L97.811 Non-pressure chronic ulcer of other part of right lower leg limited to breakdown of skin I89.0 Lymphedema, not elsewhere classified Quantity: 1 Electronic Signature(s) Signed: 04/12/2021 2:05:34 PM By: Kalman Shan DO Entered By: Kalman Shan on 04/12/2021 14:05:09

## 2021-04-12 NOTE — Progress Notes (Addendum)
[name], [name] ([id number]) Visit Report for 04/12/2021 Arrival Information Details Patient Name: Date of Service: [name], [name] 04/12/2021 1:00 PM Medical Record Number: [id number] Patient Account Number: [id number] Date of Birth/Sex: Treating RN: [birth year] (50 y.o. [name], Meta.Reding Primary Care Neymar Dowe: [name] Other Clinician: Referring Yamilka Lopiccolo: Treating Anum Palecek/Extender: Judeth Porch in Treatment: 1 Visit Information History Since Last Visit Added or deleted any medications: No Patient Arrived: Wheel Chair Any new allergies or adverse reactions: No Arrival Time: 13:10 Had a fall or experienced change in No Accompanied By: self activities of daily living that may affect Transfer Assistance: Manual risk of falls: Patient Identification Verified: Yes Signs or symptoms of abuse/neglect since last visito No Secondary Verification Process Completed: Yes Hospitalized since last visit: No Patient Requires Transmission-Based Precautions: No Implantable device outside of the clinic excluding No Patient Has Alerts: No cellular tissue based products placed in the center since last visit: Has Dressing in Place as Prescribed: Yes Has Compression in Place as Prescribed: No Pain Present Now: No Notes patient removed left foot compression wrap per patient looked blue at dorsal ankle area. Electronic Signature(s) Signed: 04/12/2021 6:26:42 PM By: Deon Pilling RN, BSN Entered By: Deon Pilling on 04/12/2021 13:19:08 -------------------------------------------------------------------------------- Compression Therapy Details Patient Name: Date of Service: Erica Devoid K. 04/12/2021 1:00 PM Medical Record Number: [id number] Patient Account Number: [id number] Date of Birth/Sex: Treating RN: 1971-01-30 (50 y.o. [name] Primary Care Elexus Barman: [name] Other Clinician: Referring Teasia Zapf: Treating Jazmin Ley/Extender: Judeth Porch in Treatment: 1 Compression Therapy Performed for Wound Assessment: Wound #14 Left,Circumferential Lower Leg Performed By: Clinician Deon Pilling, RN Compression Type: Four Layer Post Procedure Diagnosis Same as Pre-procedure Electronic Signature(s) Signed: 04/12/2021 6:26:42 PM By: Deon Pilling RN, BSN Entered By: Deon Pilling on 04/12/2021 13:40:21 -------------------------------------------------------------------------------- Compression Therapy Details Patient Name: Date of Service: Erica Devoid K. 04/12/2021 1:00 PM Medical Record Number: [id number] Patient Account Number: [id number] Date of Birth/Sex: Treating RN: January 19, 1971 (50 y.o. [name] Primary Care Bridgette Wolden: [name] Other Clinician: Referring Shaunae Sieloff: Treating Remmington Teters/Extender: Judeth Porch in Treatment: 1 Compression Therapy Performed for Wound Assessment: Wound #16 Right,Posterior Lower Leg Performed By: Clinician Deon Pilling, RN Compression Type: Four Layer Post Procedure Diagnosis Same as Pre-procedure Electronic Signature(s) Signed: 04/12/2021 6:26:42 PM By: Deon Pilling RN, BSN Entered By: Deon Pilling on 04/12/2021 13:40:21 -------------------------------------------------------------------------------- Compression Therapy Details Patient Name: Date of Service: Erica Devoid K. 04/12/2021 1:00 PM Medical Record Number: [id number] Patient Account Number: [id number] Date of Birth/Sex: Treating RN: August 06, 1970 (50 y.o. [name] Primary Care Yovanni Frenette: [name] Other Clinician: Referring Rossie Scarfone: Treating Sahan Pen/Extender: Judeth Porch in Treatment: 1 Compression Therapy Performed for Wound Assessment: Wound #15 Right,Anterior Lower Leg Performed By: Clinician Deon Pilling, RN Compression Type: Four Layer Post Procedure Diagnosis Same as Pre-procedure Electronic  Signature(s) Signed: 04/12/2021 6:26:42 PM By: Deon Pilling RN, BSN Entered By: Deon Pilling on 04/12/2021 13:40:21 -------------------------------------------------------------------------------- Encounter Discharge Information Details Patient Name: Date of Service: Erica Orson Gear K. 04/12/2021 1:00 PM Medical Record Number: [id number] Patient Account Number: [id number] Date of Birth/Sex: Treating RN: 03-22-1971 (50 y.o. [name], Tammi Klippel Primary Care [place]: [name] Other Clinician: Referring Shanon Seawright: Treating Barnet Benavides/Extender: Judeth Porch in Treatment: 1 Encounter Discharge Information Items Discharge Condition: Stable Ambulatory Status: Wheelchair Discharge Destination: Home Transportation: Private Auto Accompanied By: self Schedule Follow-up Appointment: Yes Clinical Summary of Care: Electronic Signature(s) Signed: 04/12/2021 6:26:42 PM  By: Deon Pilling RN, BSN Entered By: Deon Pilling on 04/12/2021 13:42:06 -------------------------------------------------------------------------------- Lower Extremity Assessment Details Patient Name: Date of Service: [name], DECKARD. 04/12/2021 1:00 PM Medical Record Number: [id number] Patient Account Number: [id number] Date of Birth/Sex: Treating RN: [birth year] (50 y.o. [name] Primary Care Daylyn Christine: [name] Other Clinician: Referring Sherina Stammer: Treating Zale Marcotte/Extender: Judeth Porch in Treatment: 1 Edema Assessment Assessed: Shirlyn Goltz: Yes] Patrice Paradise: Yes] Edema: [Left: Yes] [Right: Yes] Calf Left: Right: Point of Measurement: 24 cm From Medial Instep 54 cm 44 cm Ankle Left: Right: Point of Measurement: 8 cm From Medial Instep 31.5 cm 29 cm Vascular Assessment Pulses: Dorsalis Pedis Palpable: [Left:Yes] [Right:Yes] Electronic Signature(s) Signed: 04/12/2021 6:26:42 PM By: Deon Pilling RN, BSN Entered By: Deon Pilling on 04/12/2021  13:19:53 -------------------------------------------------------------------------------- Multi Wound Chart Details Patient Name: Date of Service: Erica Orson Gear K. 04/12/2021 1:00 PM Medical Record Number: [id number] Patient Account Number: [id number] Date of Birth/Sex: Treating RN: June 07, 1970 (50 y.o. [name] Primary Care Crytal Pensinger: [name] Other Clinician: Referring Kayle Correa: Treating Damaree Sargent/Extender: Judeth Porch in Treatment: 1 Vital Signs Height(in): Pulse(bpm): 34 Weight(lbs): Blood Pressure(mmHg): 123/70 Body Mass Index(BMI): Temperature(F): 98.5 Respiratory Rate(breaths/min): 20 Photos: Left, Circumferential Lower Leg Right, Anterior Lower Leg Right, Posterior Lower Leg Wound Location: Gradually Appeared Gradually Appeared Gradually Appeared Wounding Event: Lymphedema Lymphedema Lymphedema Primary Etiology: Lymphedema, Sleep Apnea, Lymphedema, Sleep Apnea, Lymphedema, Sleep Apnea, Comorbid History: Hypertension, Peripheral Venous Hypertension, Peripheral Venous Hypertension, Peripheral Venous Disease, Osteoarthritis, Confinement Disease, Osteoarthritis, Confinement Disease, Osteoarthritis, Confinement Anxiety Anxiety Anxiety 03/03/2021 03/03/2021 03/03/2021 Date Acquired: 1 1 1  Weeks of Treatment: Open Open Open Wound Status: No Yes Yes Clustered Wound: N/A 4 2 Clustered Quantity: 22x31.5x0.1 4x11x0.1 5.2x3x0.1 Measurements L x W x D (cm) 544.281 34.558 12.252 A (cm) : rea 54.428 3.456 1.225 Volume (cm) : -45.60% 51.10% 25.70% % Reduction in Area: 27.20% 75.60% 62.90% % Reduction in Volume: Full Thickness Without Exposed Full Thickness Without Exposed Full Thickness Without Exposed Classification: Support Structures Support Structures Support Structures Large Large Large Exudate Amount: Serous Serous Serous Exudate Type: Geophysical data processor Exudate Color: Distinct, outline attached Distinct,  outline attached Distinct, outline attached Wound Margin: Medium (34-66%) Large (67-100%) Large (67-100%) Granulation Amount: Red Red Red Granulation Quality: Medium (34-66%) Small (1-33%) Small (1-33%) Necrotic Amount: Fat Layer (Subcutaneous Tissue): Yes Fat Layer (Subcutaneous Tissue): Yes Fat Layer (Subcutaneous Tissue): Yes Exposed Structures: Fascia: No Fascia: No Fascia: No Tendon: No Tendon: No Tendon: No Muscle: No Muscle: No Muscle: No Joint: No Joint: No Joint: No Bone: No Bone: No Bone: No None Small (1-33%) Small (1-33%) Epithelialization: Compression Therapy Compression Therapy Compression Therapy Procedures Performed: Treatment Notes Wound #14 (Lower Leg) Wound Laterality: Left, Circumferential Cleanser Soap and Water Discharge Instruction: May shower and wash wound with dial antibacterial soap and water prior to dressing change. Wound Cleanser Discharge Instruction: Cleanse the wound with wound cleanser prior to applying a clean dressing using gauze sponges, not tissue or cotton balls. Peri-Wound Care Triamcinolone 15 (g) Discharge Instruction: Use triamcinolone 15 (g) as directed Sween Lotion (Moisturizing lotion) Discharge Instruction: Apply moisturizing lotion as directed Topical Primary Dressing KerraCel Ag Gelling Fiber Dressing, 4x5 in (silver alginate) Discharge Instruction: Apply silver alginate to wound bed as instructed Secondary Dressing ABD Pad, 8x10 Discharge Instruction: Apply over primary dressing as directed. Zetuvit Plus 4x8 in Discharge Instruction: Apply over primary dressing as directed. Secured With Compression Wrap FourPress (4 layer compression wrap) Discharge Instruction: Apply four layer compression  only. DO not use coflex. Compression Stockings Add-Ons Wound #15 (Lower Leg) Wound Laterality: Right, Anterior Cleanser Soap and Water Discharge Instruction: May shower and wash wound with dial antibacterial soap and water  prior to dressing change. Wound Cleanser Discharge Instruction: Cleanse the wound with wound cleanser prior to applying a clean dressing using gauze sponges, not tissue or cotton balls. Peri-Wound Care Triamcinolone 15 (g) Discharge Instruction: Use triamcinolone 15 (g) as directed Sween Lotion (Moisturizing lotion) Discharge Instruction: Apply moisturizing lotion as directed Topical Primary Dressing KerraCel Ag Gelling Fiber Dressing, 4x5 in (silver alginate) Discharge Instruction: Apply silver alginate to wound bed as instructed Secondary Dressing ABD Pad, 8x10 Discharge Instruction: Apply over primary dressing as directed. Zetuvit Plus 4x8 in Discharge Instruction: Apply over primary dressing as directed. Secured With Compression Wrap FourPress (4 layer compression wrap) Discharge Instruction: Apply four layer compression only. DO not use coflex. Compression Stockings Add-Ons Wound #16 (Lower Leg) Wound Laterality: Right, Posterior Cleanser Soap and Water Discharge Instruction: May shower and wash wound with dial antibacterial soap and water prior to dressing change. Wound Cleanser Discharge Instruction: Cleanse the wound with wound cleanser prior to applying a clean dressing using gauze sponges, not tissue or cotton balls. Peri-Wound Care Triamcinolone 15 (g) Discharge Instruction: Use triamcinolone 15 (g) as directed Sween Lotion (Moisturizing lotion) Discharge Instruction: Apply moisturizing lotion as directed Topical Primary Dressing KerraCel Ag Gelling Fiber Dressing, 4x5 in (silver alginate) Discharge Instruction: Apply silver alginate to wound bed as instructed Secondary Dressing ABD Pad, 8x10 Discharge Instruction: Apply over primary dressing as directed. Zetuvit Plus 4x8 in Discharge Instruction: Apply over primary dressing as directed. Secured With Compression Wrap FourPress (4 layer compression wrap) Discharge Instruction: Apply four layer compression only.  DO not use coflex. Compression Stockings Add-Ons Electronic Signature(s) Signed: 04/12/2021 2:05:34 PM By: Kalman Shan DO Signed: 04/12/2021 6:07:33 PM By: Lorrin Jackson Entered By: Kalman Shan on 04/12/2021 13:53:09 -------------------------------------------------------------------------------- Multi-Disciplinary Care Plan Details Patient Name: Date of Service: Erica Devoid K. 04/12/2021 1:00 PM Medical Record Number: [id number] Patient Account Number: [id number] Date of Birth/Sex: Treating RN: [birth year] (50 y.o. [name] Primary Care Jonerik Sliker: [name] Other Clinician: Referring Yesli Vanderhoff: Treating Rona Tomson/Extender: Judeth Porch in Treatment: 1 Active Inactive Electronic Signature(s) Signed: 05/12/2021 9:12:05 AM By: Deon Pilling RN, BSN Previous Signature: 04/12/2021 6:26:42 PM Version By: Deon Pilling RN, BSN Entered By: Deon Pilling on 05/12/2021 09:12:04 -------------------------------------------------------------------------------- Pain Assessment Details Patient Name: Date of Service: Erica Orson Gear K. 04/12/2021 1:00 PM Medical Record Number: [id number] Patient Account Number: [id number] Date of Birth/Sex: Treating RN: 03/08/1971 (50 y.o. [name] Primary Care Hardie Veltre: [name] Other Clinician: Referring Arkie Tagliaferro: Treating Tsering Leaman/Extender: Judeth Porch in Treatment: 1 Active Problems Location of Pain Severity and Description of Pain Patient Has Paino No Site Locations Rate the pain. Current Pain Level: 0 Pain Management and Medication Current Pain Management: Medication: No Cold Application: No Rest: No Massage: No Activity: No T.E.N.S.: No Heat Application: No Leg drop or elevation: No Is the Current Pain Management Adequate: Adequate How does your wound impact your activities of daily livingo Sleep: No Bathing: No Appetite: No Relationship  With Others: No Bladder Continence: No Emotions: No Bowel Continence: No Work: No Toileting: No Drive: No Dressing: No Hobbies: No Engineer, maintenance) Signed: 04/12/2021 6:26:42 PM By: Deon Pilling RN, BSN Entered By: Deon Pilling on 04/12/2021 13:19:31 -------------------------------------------------------------------------------- Patient/Caregiver Education Details Patient Name: Date of Service: Erica [name], Syerra K. 11/28/2022andnbsp1:00 PM Medical Record  Number: [id number] Patient Account Number: [id number] Date of Birth/Gender: Treating RN: 09-Jun-1970 (50 y.o. [name] Primary Care Physician: [name] Other Clinician: Referring Physician: Treating Physician/Extender: Judeth Porch in Treatment: 1 Education Assessment Education Provided To: Patient Education Topics Provided Wound/Skin Impairment: Handouts: Skin Care Do's and Dont's Methods: Explain/Verbal Responses: Reinforcements needed Electronic Signature(s) Signed: 04/12/2021 6:26:42 PM By: Deon Pilling RN, BSN Entered By: Deon Pilling on 04/12/2021 13:31:00 -------------------------------------------------------------------------------- Wound Assessment Details Patient Name: Date of Service: Erica Orson Gear K. 04/12/2021 1:00 PM Medical Record Number: [id number] Patient Account Number: [id number] Date of Birth/Sex: Treating RN: 1971/03/14 (50 y.o. [name] Primary Care Jacquilyn Seldon: [name] Other Clinician: Referring Marcelina Mclaurin: Treating Jasmina Gendron/Extender: Judeth Porch in Treatment: 1 Wound Status Wound Number: 14 Primary Lymphedema Etiology: Wound Location: Left, Circumferential Lower Leg Wound Open Wounding Event: Gradually Appeared Status: Date Acquired: 03/03/2021 Comorbid Lymphedema, Sleep Apnea, Hypertension, Peripheral Venous Weeks Of Treatment: 1 History: Disease, Osteoarthritis, Confinement  Anxiety Clustered Wound: No Photos Wound Measurements Length: (cm) 22 Width: (cm) 31.5 Depth: (cm) 0.1 Area: (cm) 544.281 Volume: (cm) 54.428 % Reduction in Area: -45.6% % Reduction in Volume: 27.2% Epithelialization: None Tunneling: No Undermining: No Wound Description Classification: Full Thickness Without Exposed Support Structures Wound Margin: Distinct, outline attached Exudate Amount: Large Exudate Type: Serous Exudate Color: amber Foul Odor After Cleansing: No Slough/Fibrino Yes Wound Bed Granulation Amount: Medium (34-66%) Exposed Structure Granulation Quality: Red Fascia Exposed: No Necrotic Amount: Medium (34-66%) Fat Layer (Subcutaneous Tissue) Exposed: Yes Necrotic Quality: Adherent Slough Tendon Exposed: No Muscle Exposed: No Joint Exposed: No Bone Exposed: No Electronic Signature(s) Signed: 04/12/2021 6:26:42 PM By: Deon Pilling RN, BSN Entered By: Deon Pilling on 04/12/2021 13:24:36 -------------------------------------------------------------------------------- Wound Assessment Details Patient Name: Date of Service: Erica Orson Gear K. 04/12/2021 1:00 PM Medical Record Number: [id number] Patient Account Number: [id number] Date of Birth/Sex: Treating RN: 1970-08-30 (50 y.o. [name], Meta.Reding Primary Care Mayan Kloepfer: [name] Other Clinician: Referring Hillel Card: Treating Yazmin Locher/Extender: Judeth Porch in Treatment: 1 Wound Status Wound Number: 15 Primary Lymphedema Etiology: Wound Location: Right, Anterior Lower Leg Wound Open Wounding Event: Gradually Appeared Status: Date Acquired: 03/03/2021 Comorbid Lymphedema, Sleep Apnea, Hypertension, Peripheral Venous Weeks Of Treatment: 1 History: Disease, Osteoarthritis, Confinement Anxiety Clustered Wound: Yes Photos Wound Measurements Length: (cm) 4 Width: (cm) 11 Depth: (cm) 0.1 Clustered Quantity: 4 Area: (cm) 34.558 Volume: (cm) 3.456 % Reduction in  Area: 51.1% % Reduction in Volume: 75.6% Epithelialization: Small (1-33%) Tunneling: No Undermining: No Wound Description Classification: Full Thickness Without Exposed Support Structures Wound Margin: Distinct, outline attached Exudate Amount: Large Exudate Type: Serous Exudate Color: amber Foul Odor After Cleansing: No Slough/Fibrino Yes Wound Bed Granulation Amount: Large (67-100%) Exposed Structure Granulation Quality: Red Fascia Exposed: No Necrotic Amount: Small (1-33%) Fat Layer (Subcutaneous Tissue) Exposed: Yes Necrotic Quality: Adherent Slough Tendon Exposed: No Muscle Exposed: No Joint Exposed: No Bone Exposed: No Electronic Signature(s) Signed: 04/12/2021 6:26:42 PM By: Deon Pilling RN, BSN Entered By: Deon Pilling on 04/12/2021 13:25:10 -------------------------------------------------------------------------------- Wound Assessment Details Patient Name: Date of Service: Erica Orson Gear K. 04/12/2021 1:00 PM Medical Record Number: [id number] Patient Account Number: [id number] Date of Birth/Sex: Treating RN: [birth year] (50 y.o. [name] Primary Care Locke Barrell: [name] Other Clinician: Referring Sewell Pitner: Treating Deshay Blumenfeld/Extender: Judeth Porch in Treatment: 1 Wound Status Wound Number: 16 Primary Lymphedema Etiology: Wound Location: Right, Posterior Lower Leg Wound Open Wounding Event: Gradually Appeared Status: Date Acquired: 03/03/2021 Comorbid Lymphedema,  Sleep Apnea, Hypertension, Peripheral Venous Weeks Of Treatment: 1 History: Disease, Osteoarthritis, Confinement Anxiety Clustered Wound: Yes Photos Wound Measurements Length: (cm) 5.2 Width: (cm) 3 Depth: (cm) 0.1 Clustered Quantity: 2 Area: (cm) 12.252 Volume: (cm) 1.225 % Reduction in Area: 25.7% % Reduction in Volume: 62.9% Epithelialization: Small (1-33%) Tunneling: No Undermining: No Wound Description Classification: Full Thickness  Without Exposed Support Structures Wound Margin: Distinct, outline attached Exudate Amount: Large Exudate Type: Serous Exudate Color: amber Foul Odor After Cleansing: No Slough/Fibrino Yes Wound Bed Granulation Amount: Large (67-100%) Exposed Structure Granulation Quality: Red Fascia Exposed: No Necrotic Amount: Small (1-33%) Fat Layer (Subcutaneous Tissue) Exposed: Yes Necrotic Quality: Adherent Slough Tendon Exposed: No Muscle Exposed: No Joint Exposed: No Bone Exposed: No Electronic Signature(s) Signed: 04/12/2021 6:26:42 PM By: Deon Pilling RN, BSN Entered By: Deon Pilling on 04/12/2021 13:25:37 -------------------------------------------------------------------------------- Vitals Details Patient Name: Date of Service: Erica Orson Gear K. 04/12/2021 1:00 PM Medical Record Number: [id number] Patient Account Number: [id number] Date of Birth/Sex: Treating RN: [birth year] (50 y.o. [name] Primary Care Sueann Brownley: [name] Other Clinician: Referring Karan Ramnauth: Treating Hung Rhinesmith/Extender: Judeth Porch in Treatment: 1 Vital Signs Time Taken: 13:10 Temperature (F): 98.5 Pulse (bpm): 73 Respiratory Rate (breaths/min): 20 Blood Pressure (mmHg): 123/70 Reference Range: 80 - 120 mg / dl Electronic Signature(s) Signed: 04/12/2021 6:26:42 PM By: Deon Pilling RN, BSN Entered By: Deon Pilling on 04/12/2021 13:19:22

## 2021-04-14 ENCOUNTER — Telehealth: Payer: Self-pay | Admitting: Family Medicine

## 2021-04-14 NOTE — Telephone Encounter (Signed)
Pt called in asking if [name] would place orders to have Xrays done on the lower and upper back. She states that she saw the chiropractor today and was unable to do them because she was weak in the knees.   Please advise   Pt can be reached at the home #

## 2021-04-15 NOTE — Telephone Encounter (Signed)
I have scheduled the pt for an appt ELEA

## 2021-04-15 NOTE — Telephone Encounter (Signed)
I cannot place xrays without pt being seen as I'm not sure what I'm xraying or why

## 2021-04-19 ENCOUNTER — Other Ambulatory Visit: Payer: Self-pay

## 2021-04-19 ENCOUNTER — Encounter (HOSPITAL_BASED_OUTPATIENT_CLINIC_OR_DEPARTMENT_OTHER): Payer: Medicare Other | Attending: Internal Medicine | Admitting: Internal Medicine

## 2021-04-19 DIAGNOSIS — L97811 Non-pressure chronic ulcer of other part of right lower leg limited to breakdown of skin: Secondary | ICD-10-CM | POA: Insufficient documentation

## 2021-04-19 DIAGNOSIS — I87333 Chronic venous hypertension (idiopathic) with ulcer and inflammation of bilateral lower extremity: Secondary | ICD-10-CM | POA: Diagnosis not present

## 2021-04-19 DIAGNOSIS — M300 Polyarteritis nodosa: Secondary | ICD-10-CM | POA: Insufficient documentation

## 2021-04-19 DIAGNOSIS — I890 Lymphedema, not elsewhere classified: Secondary | ICD-10-CM | POA: Diagnosis not present

## 2021-04-19 DIAGNOSIS — L97821 Non-pressure chronic ulcer of other part of left lower leg limited to breakdown of skin: Secondary | ICD-10-CM | POA: Insufficient documentation

## 2021-04-20 NOTE — Progress Notes (Signed)
[name], [name] ([id number]) Visit Report for 04/19/2021 SuperBill Details Patient Name: Date of Service: [name], [name] 04/19/2021 Medical Record Number: [id number] Patient Account Number: [id number] Date of Birth/Sex: Treating RN: [birth year] (50 y.o. 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 ICD-10 Diagnosis Description L97.821 Non-pressure chronic ulcer of other part of left lower leg limited to breakdown of skin L97.811 Non-pressure chronic ulcer of other part of right lower leg limited to breakdown of skin I89.0 Lymphedema, not elsewhere classified Electronic Signature(s) Signed: 04/19/2021 1:40:06 PM By: Lorrin Jackson Signed: 04/20/2021 3:41:17 PM By: Kalman Shan DO Entered By: Lorrin Jackson on 04/19/2021 13:40:05

## 2021-04-20 NOTE — Progress Notes (Addendum)
Erica, Macias ([id number]) Visit Report for 04/19/2021 Arrival Information Details Patient Name: Date of Service: Erica Erica, Macias 04/19/2021 1:00 PM Medical Record Number: [id number] Patient Account Number: [id number] Date of Birth/Sex: Treating RN: Aug 28, 1970 (50 y.o. [name] Primary Care Haja Crego: [name] Other Clinician: Referring Hailee Hollick: Treating Icholas Irby/Extender: Judeth Porch in Treatment: 2 Visit Information History Since Last Visit Added or deleted any medications: No Patient Arrived: Wheel Chair Any new allergies or adverse reactions: No Arrival Time: 13:34 Had a fall or experienced change in No Transfer Assistance: Manual activities of daily living that may affect Patient Identification Verified: Yes risk of falls: Secondary Verification Process Completed: Yes Signs or symptoms of abuse/neglect since last visito No Patient Requires Transmission-Based Precautions: No Hospitalized since last visit: No Patient Has Alerts: No Implantable device outside of the clinic excluding No cellular tissue based products placed in the center since last visit: Has Dressing in Place as Prescribed: Yes Pain Present Now: No Electronic Signature(s) Signed: 04/19/2021 1:34:44 PM By: Lorrin Jackson Entered By: Lorrin Jackson on 04/19/2021 13:34:44 -------------------------------------------------------------------------------- Compression Therapy Details Patient Name: Date of Service: Erica Devoid K. 04/19/2021 1:00 PM Medical Record Number: [id number] Patient Account Number: [id number] Date of Birth/Sex: Treating RN: 1970-07-22 (50 y.o. [name] Primary Care Tajh Livsey: [name] Other Clinician: Referring Arleigh Odowd: Treating Keiyana Stehr/Extender: Judeth Porch in Treatment: 2 Compression Therapy Performed for Wound Assessment: Wound #14 Left,Circumferential Lower Leg Performed By: Clinician Lorrin Jackson, RN Compression Type: Double Layer Electronic Signature(s) Signed: 04/19/2021 1:37:58 PM By: Lorrin Jackson Entered By: Lorrin Jackson on 04/19/2021 13:37:58 -------------------------------------------------------------------------------- Compression Therapy Details Patient Name: Date of Service: Erica Devoid K. 04/19/2021 1:00 PM Medical Record Number: [id number] Patient Account Number: [id number] Date of Birth/Sex: Treating RN: July 19, 1970 (50 y.o. [name] Primary Care Sostenes Kauffmann: Other Clinician: [name] Referring Dannya Pitkin: Treating Kassy Mcenroe/Extender: Judeth Porch in Treatment: 2 Compression Therapy Performed for Wound Assessment: Wound #15 Right,Anterior Lower Leg Performed By: Clinician Lorrin Jackson, RN Compression Type: Double Layer Electronic Signature(s) Signed: 04/19/2021 1:37:59 PM By: Lorrin Jackson Entered By: Lorrin Jackson on 04/19/2021 13:37:58 -------------------------------------------------------------------------------- Compression Therapy Details Patient Name: Date of Service: Erica Devoid K. 04/19/2021 1:00 PM Medical Record Number: [id number] Patient Account Number: [id number] Date of Birth/Sex: Treating RN: [birth year] (50 y.o. [name] Primary Care Monty Mccarrell: [name] Other Clinician: Referring Stephanny Tsutsui: Treating Desia Saban/Extender: Judeth Porch in Treatment: 2 Compression Therapy Performed for Wound Assessment: Wound #16 Right,Posterior Lower Leg Performed By: Clinician Lorrin Jackson, RN Compression Type: Double Layer Electronic Signature(s) Signed: 04/19/2021 1:37:59 PM By: Lorrin Jackson Entered By: Lorrin Jackson on 04/19/2021 13:37:59 -------------------------------------------------------------------------------- Encounter Discharge Information Details Patient Name: Date of Service: Erica Orson Gear K. 04/19/2021 1:00 PM Medical Record Number:  [id number] Patient Account Number: [id number] Date of Birth/Sex: Treating RN: 1970-05-20 (50 y.o. [name] Primary Care Lathaniel Legate: [name] Other Clinician: Referring Adhvik Canady: Treating Cora Stetson/Extender: Judeth Porch in Treatment: 2 Encounter Discharge Information Items Discharge Condition: Stable Ambulatory Status: Wheelchair Discharge Destination: Home Transportation: Private Auto Schedule Follow-up Appointment: Yes Clinical Summary of Care: Provided on 04/19/2021 Form Type Recipient Paper Patient Patient Electronic Signature(s) Signed: 04/19/2021 1:39:48 PM By: Lorrin Jackson Entered By: Lorrin Jackson on 04/19/2021 13:39:48 -------------------------------------------------------------------------------- Patient/Caregiver Education Details Patient Name: Date of Service: [name] 12/5/2022andnbsp1:00 PM Medical Record Number: [id number] Patient Account Number: [id number] Date of Birth/Gender: Treating RN: 01-15-1971 (50 y.o. [name] Primary  Care Physician: [name] Other Clinician: Referring Physician: Treating Physician/Extender: Judeth Porch in Treatment: 2 Education Assessment Education Provided To: Patient Education Topics Provided Venous: Methods: Explain/Verbal, Printed Responses: State content correctly Wound/Skin Impairment: Methods: Explain/Verbal, Printed Responses: State content correctly Electronic Signature(s) Signed: 04/20/2021 3:40:48 PM By: Lorrin Jackson Entered By: Lorrin Jackson on 04/19/2021 13:38:48 -------------------------------------------------------------------------------- Wound Assessment Details Patient Name: Date of Service: Erica Orson Gear K. 04/19/2021 1:00 PM Medical Record Number: [id number] Patient Account Number: [id number] Date of Birth/Sex: Treating RN: Feb 25, 1971 (50 y.o. [name] Primary Care Alise Calais: [name] Other Clinician: Referring Jadiel Schmieder: Treating Merit Gadsby/Extender: Judeth Porch in Treatment: 2 Wound Status Wound Number: 14 Primary Lymphedema Etiology: Wound Location: Left, Circumferential Lower Leg Wound Open Wounding Event: Gradually Appeared Status: Date Acquired: 03/03/2021 Comorbid Lymphedema, Sleep Apnea, Hypertension, Peripheral Venous Weeks Of Treatment: 2 History: Disease, Osteoarthritis, Confinement Anxiety Clustered Wound: No Wound Measurements Length: (cm) 8 Width: (cm) 10 Depth: (cm) 0.1 Area: (cm) 62.832 Volume: (cm) 6.283 % Reduction in Area: 83.2% % Reduction in Volume: 91.6% Epithelialization: Medium (34-66%) Tunneling: No Undermining: No Wound Description Classification: Full Thickness Without Exposed Support Structures Wound Margin: Distinct, outline attached Exudate Amount: Medium Exudate Type: Serous Exudate Color: amber Wound Bed Granulation Amount: Large (67-100%) Granulation Quality: Red Necrotic Amount: Small (1-33%) Necrotic Quality: Adherent Slough Foul Odor After Cleansing: No Slough/Fibrino Yes Exposed Structure Fascia Exposed: No Fat Layer (Subcutaneous Tissue) Exposed: Yes Tendon Exposed: No Muscle Exposed: No Joint Exposed: No Bone Exposed: No Treatment Notes Wound #14 (Lower Leg) Wound Laterality: Left, Circumferential Cleanser Soap and Water Discharge Instruction: May shower and wash wound with dial antibacterial soap and water prior to dressing change. Wound Cleanser Discharge Instruction: Cleanse the wound with wound cleanser prior to applying a clean dressing using gauze sponges, not tissue or cotton balls. Peri-Wound Care Topical Primary Dressing KerraCel Ag Gelling Fiber Dressing, 4x5 in (silver alginate) Discharge Instruction: Apply silver alginate to wound bed as instructed Secondary Dressing ABD Pad, 8x10 Discharge Instruction: Apply over primary dressing as  directed. Zetuvit Plus 4x8 in Discharge Instruction: Apply over primary dressing as directed. Secured With Compression Wrap CoFlex TLC XL 2-layer Compression System 4x7 (in/yd) Discharge Instruction: Apply CoFlex 2-layer compression as directed. (alt for 4 layer) Compression Stockings Add-Ons Electronic Signature(s) Signed: 04/19/2021 1:35:52 PM By: Lorrin Jackson Entered By: Lorrin Jackson on 04/19/2021 13:35:52 -------------------------------------------------------------------------------- Wound Assessment Details Patient Name: Date of Service: Erica Orson Gear K. 04/19/2021 1:00 PM Medical Record Number: [id number] Patient Account Number: [id number] Date of Birth/Sex: Treating RN: [birth year] (50 y.o. [name] Primary Care Bettyann Birchler: [name] Other Clinician: Referring Orestes Geiman: Treating Maryori Weide/Extender: Judeth Porch in Treatment: 2 Wound Status Wound Number: 15 Primary Lymphedema Etiology: Wound Location: Right, Anterior Lower Leg Wound Open Wounding Event: Gradually Appeared Status: Date Acquired: 03/03/2021 Comorbid Lymphedema, Sleep Apnea, Hypertension, Peripheral Venous Weeks Of Treatment: 2 History: Disease, Osteoarthritis, Confinement Anxiety Clustered Wound: Yes Wound Measurements Length: (cm) 3 Width: (cm) 3.2 Depth: (cm) 0.1 Clustered Quantity: 4 Area: (cm) 7.54 Volume: (cm) 0.754 % Reduction in Area: 89.3% % Reduction in Volume: 94.7% Epithelialization: Medium (34-66%) Tunneling: No Undermining: No Wound Description Classification: Full Thickness Without Exposed Support Structures Wound Margin: Distinct, outline attached Exudate Amount: Medium Exudate Type: Serosanguineous Exudate Color: red, brown Foul Odor After Cleansing: No Slough/Fibrino Yes Wound Bed Granulation Amount: Large (67-100%) Exposed Structure Granulation Quality: Red Fascia Exposed: No Necrotic Amount: Small (1-33%) Fat Layer  (Subcutaneous Tissue) Exposed: Yes  Necrotic Quality: Adherent Slough Tendon Exposed: No Muscle Exposed: No Joint Exposed: No Bone Exposed: No Treatment Notes Wound #15 (Lower Leg) Wound Laterality: Right, Anterior Cleanser Soap and Water Discharge Instruction: May shower and wash wound with dial antibacterial soap and water prior to dressing change. Wound Cleanser Discharge Instruction: Cleanse the wound with wound cleanser prior to applying a clean dressing using gauze sponges, not tissue or cotton balls. Peri-Wound Care Topical Primary Dressing KerraCel Ag Gelling Fiber Dressing, 4x5 in (silver alginate) Discharge Instruction: Apply silver alginate to wound bed as instructed Secondary Dressing ABD Pad, 8x10 Discharge Instruction: Apply over primary dressing as directed. Zetuvit Plus 4x8 in Discharge Instruction: Apply over primary dressing as directed. Secured With Compression Wrap CoFlex TLC XL 2-layer Compression System 4x7 (in/yd) Discharge Instruction: Apply CoFlex 2-layer compression as directed. (alt for 4 layer) Compression Stockings Add-Ons Electronic Signature(s) Signed: 04/19/2021 1:36:53 PM By: Lorrin Jackson Entered By: Lorrin Jackson on 04/19/2021 13:36:52 -------------------------------------------------------------------------------- Wound Assessment Details Patient Name: Date of Service: Erica Orson Gear K. 04/19/2021 1:00 PM Medical Record Number: [id number] Patient Account Number: [id number] Date of Birth/Sex: Treating RN: May 28, 1970 (50 y.o. [name] Primary Care Keen Ewalt: [name] Other Clinician: Referring Mikaelah Trostle: Treating Yovani Cogburn/Extender: Judeth Porch in Treatment: 2 Wound Status Wound Number: 16 Primary Lymphedema Etiology: Wound Location: Right, Posterior Lower Leg Wound Open Wounding Event: Gradually Appeared Status: Date Acquired: 03/03/2021 Comorbid Lymphedema, Sleep Apnea, Hypertension,  Peripheral Venous Weeks Of Treatment: 2 History: Disease, Osteoarthritis, Confinement Anxiety Clustered Wound: Yes Wound Measurements Length: (cm) 4 Width: (cm) 2 Depth: (cm) 0.1 Clustered Quantity: 2 Area: (cm) 6. Volume: (cm) 0. % Reduction in Area: 61.9% % Reduction in Volume: 81% Epithelialization: Medium (34-66%) Tunneling: No 283 Undermining: No 628 Wound Description Classification: Full Thickness Without Exposed Support Structures Wound Margin: Distinct, outline attached Exudate Amount: Medium Exudate Type: Serosanguineous Exudate Color: red, brown Foul Odor After Cleansing: No Slough/Fibrino Yes Wound Bed Granulation Amount: Large (67-100%) Exposed Structure Granulation Quality: Red Fascia Exposed: No Necrotic Amount: Small (1-33%) Fat Layer (Subcutaneous Tissue) Exposed: Yes Necrotic Quality: Adherent Slough Tendon Exposed: No Muscle Exposed: No Joint Exposed: No Bone Exposed: No Treatment Notes Wound #16 (Lower Leg) Wound Laterality: Right, Posterior Cleanser Soap and Water Discharge Instruction: May shower and wash wound with dial antibacterial soap and water prior to dressing change. Wound Cleanser Discharge Instruction: Cleanse the wound with wound cleanser prior to applying a clean dressing using gauze sponges, not tissue or cotton balls. Peri-Wound Care Topical Primary Dressing KerraCel Ag Gelling Fiber Dressing, 4x5 in (silver alginate) Discharge Instruction: Apply silver alginate to wound bed as instructed Secondary Dressing ABD Pad, 8x10 Discharge Instruction: Apply over primary dressing as directed. Zetuvit Plus 4x8 in Discharge Instruction: Apply over primary dressing as directed. Secured With Compression Wrap CoFlex TLC XL 2-layer Compression System 4x7 (in/yd) Discharge Instruction: Apply CoFlex 2-layer compression as directed. (alt for 4 layer) Compression Stockings Add-Ons Electronic Signature(s) Signed: 04/19/2021 1:37:26 PM By:  Lorrin Jackson Entered By: Lorrin Jackson on 04/19/2021 13:37:25 -------------------------------------------------------------------------------- Vitals Details Patient Name: Date of Service: Erica [name], Otila Kluver K. 04/19/2021 1:00 PM Medical Record Number: [id number] Patient Account Number: [id number] Date of Birth/Sex: Treating RN: 1970/12/29 (50 y.o. [name] Primary Care Amaan Meyer: [name] Other Clinician: Referring Kourtlyn Charlet: Treating Gisselle Galvis/Extender: Judeth Porch in Treatment: 2 Vital Signs Time Taken: 13:20 Temperature (F): 98.2 Pulse (bpm): 78 Respiratory Rate (breaths/min): 20 Blood Pressure (mmHg): 130/78 Reference Range: 80 - 120 mg / dl Electronic  Signature(s) Signed: 04/19/2021 1:35:04 PM By: Lorrin Jackson Entered By: Lorrin Jackson on 04/19/2021 13:35:04

## 2021-04-21 ENCOUNTER — Other Ambulatory Visit: Payer: Self-pay

## 2021-04-21 ENCOUNTER — Encounter (HOSPITAL_BASED_OUTPATIENT_CLINIC_OR_DEPARTMENT_OTHER): Payer: Medicare Other | Admitting: Physician Assistant

## 2021-04-21 ENCOUNTER — Other Ambulatory Visit: Payer: Self-pay | Admitting: Family Medicine

## 2021-04-23 ENCOUNTER — Ambulatory Visit: Payer: Medicare Other | Admitting: Family Medicine

## 2021-04-26 ENCOUNTER — Encounter (HOSPITAL_BASED_OUTPATIENT_CLINIC_OR_DEPARTMENT_OTHER): Payer: Medicare Other | Admitting: Internal Medicine

## 2021-04-26 ENCOUNTER — Other Ambulatory Visit: Payer: Self-pay | Admitting: Family Medicine

## 2021-04-27 ENCOUNTER — Telehealth (INDEPENDENT_AMBULATORY_CARE_PROVIDER_SITE_OTHER): Payer: Medicare Other | Admitting: Family Medicine

## 2021-04-27 ENCOUNTER — Encounter: Payer: Self-pay | Admitting: Family Medicine

## 2021-04-27 VITALS — Ht 62.5 in | Wt 282.0 lb

## 2021-04-27 DIAGNOSIS — S4991XA Unspecified injury of right shoulder and upper arm, initial encounter: Secondary | ICD-10-CM

## 2021-04-27 NOTE — Progress Notes (Signed)
Virtual Visit via Video   I connected with patient on 04/27/21 at 11:00 AM EST by a video enabled telemedicine application and verified that I am speaking with the correct person using two identifiers.  Location patient: Home Location provider: [facility], Office Persons participating in the virtual visit: Patient, Provider, [place] [employer] [place] C)  I discussed the limitations of evaluation and management by telemedicine and the availability of in person appointments. The patient expressed understanding and agreed to proceed.  Subjective:   HPI:   R arm pain- pt got caught in her Rollator and her hand and arm got 'tangled up and got stretched'.  Continues to have pain in R fingertips.  Pt reports she went to the chiropractor and was told they could work on her but only after having xrays done of R hand, wrist, shoulder to ensure no bony damage.  Pt would like these xrays done so she can proceed w/ tx.  ROS:   See pertinent positives and negatives per HPI.  Patient Active Problem List   Diagnosis Date Noted   Cervical atypia 01/06/2021   Steatosis of liver 01/06/2021   Hyponatremia 07/27/2019   Urinary urgency 04/04/2018   Chronic narcotic use 09/07/2016   Acute blood loss anemia 01/14/2016   Multiple gastric ulcers    PAN (polyarteritis nodosa) ([place]) 11/24/2015   GERD (gastroesophageal reflux disease) 01/08/2014   Routine general medical examination at a health care facility 04/23/2013   Bariatric surgery status 10/31/2012   S/P skin biopsy 10/31/2012   Psoriasis 01/09/2012   DDD (degenerative disc disease), lumbar 11/30/2011   Migraine headache    [name] (obstructive sleep apnea) 11/16/2010   HYPERGLYCEMIA, FASTING 10/08/2009   CONTRACTURE OF TENDON 08/17/2009   PARESTHESIA, HANDS 12/23/2008   Leg pain, right 11/05/2008   ADVERSE DRUG REACTION 04/03/2008   PERIPHERAL EDEMA 03/26/2008   Morbid obesity ([place]) 01/03/2007   Cellulitis 01/03/2007   Hyperlipidemia 09/22/2006    Fibromyalgia 09/20/2006   Anxiety and depression 06/28/2006    Social History   Tobacco Use   Smoking status: Never   Smokeless tobacco: Never  Substance Use Topics   Alcohol use: No    Current Outpatient Medications:    ALPRAZolam (XANAX) 0.5 MG tablet, TAKE 1 TABLET BY MOUTH 2 TIMES DAILY AS NEEDED FOR ANXIETY, Disp: 30 tablet, Rfl: 0   ARIPiprazole (ABILIFY) 10 MG tablet, Take 10 mg by mouth in the morning. , Disp: , Rfl:    Armodafinil 250 MG tablet, 3 (three) times daily. 8am, 2pm, 6pm, Disp: , Rfl:    Biotin 10 MG TABS, Take 10 mg by mouth in the morning. , Disp: , Rfl:    buPROPion (WELLBUTRIN XL) 300 MG 24 hr tablet, TAKE 1 TABLET BY MOUTH EVERY DAY, Disp: 30 tablet, Rfl: 2   busPIRone (BUSPAR) 30 MG tablet, TAKE 1 TABLET BY MOUTH 2 TIMES DAILY, Disp: 60 tablet, Rfl: 2   calcium citrate (CALCITRATE - DOSED IN MG ELEMENTAL CALCIUM) 950 MG tablet, Take 1 tablet by mouth daily., Disp: , Rfl:    clobetasol cream (TEMOVATE) 0.63 %, Apply 1 application topically 2 (two) times daily. Cover both legs with Colbetasol cream, Disp: 30 g, Rfl: 0   clonazePAM (KLONOPIN) 1 MG tablet, TAKE 1 TABLET BY MOUTH AT BEDTIME, Disp: 30 tablet, Rfl: 3   collagenase (SANTYL) ointment, Apply topically daily. apply Santyl to scabs on legs, Disp: 15 g, Rfl: 0   cyclobenzaprine (FLEXERIL) 10 MG tablet, TAKE 1 TABLET BY MOUTH 3  TIMES DAILY AS NEEDED, Disp: 270 tablet, Rfl: 2   doxycycline (VIBRA-TABS) 100 MG tablet, Take 1 tablet (100 mg total) by mouth 2 (two) times daily., Disp: 14 tablet, Rfl: 0   DULoxetine (CYMBALTA) 60 MG capsule, Take 2 capsules (120 mg total) by mouth daily., Disp: 240 capsule, Rfl: 2   FEROSUL 325 (65 Fe) MG tablet, TAKE 1 TABLET BY MOUTH EVERY DAY WITH BREAKFAST, Disp: 30 tablet, Rfl: 2   fluconazole (DIFLUCAN) 100 MG tablet, Take 1 tablet (100 mg total) by mouth daily., Disp: 7 tablet, Rfl: 0   furosemide (LASIX) 40 MG tablet, TAKE 1 TABLET BY MOUTH EVERY DAY. MAY take additional  TABLET IF weight INCREASE by 5lb AND call MD FOR futher instructions, Disp: 30 tablet, Rfl: 1   gabapentin (NEURONTIN) 600 MG tablet, Take 1 tablet (600 mg total) by mouth 3 (three) times daily. TAKE 2 TABLETS BY MOUTH 3 TIMES DAILY, Disp: 270 tablet, Rfl: 0   HYDROcodone-acetaminophen (NORCO) 10-325 MG tablet, TAKE 1 TABLET BY MOUTH EVERY 6 HOURS AS NEEDED, Disp: 90 tablet, Rfl: 0   irbesartan (AVAPRO) 75 MG tablet, TAKE 1 TABLET BY MOUTH EVERY DAY, Disp: 30 tablet, Rfl: 0   meclizine (ANTIVERT) 25 MG tablet, Take 1 tablet (25 mg total) by mouth 3 (three) times daily as needed for dizziness., Disp: 30 tablet, Rfl: 0   mupirocin ointment (BACTROBAN) 2 %, APPLY TOPICALLY 2 TIMES DAILY, Disp: 22 g, Rfl: 1   nebivolol (BYSTOLIC) 10 MG tablet, TAKE 1 TABLET BY MOUTH EVERY DAY, Disp: 30 tablet, Rfl: 2   nystatin (MYCOSTATIN/NYSTOP) powder, Apply 1 application topically 3 (three) times daily., Disp: 60 g, Rfl: 3   Oxcarbazepine (TRILEPTAL) 300 MG tablet, Take 300 mg by mouth 2 (two) times daily., Disp: , Rfl:    pantoprazole (PROTONIX) 40 MG tablet, TAKE 1 TABLET BY MOUTH 2 TIMES DAILY, Disp: 60 tablet, Rfl: 2   pentoxifylline (TRENTAL) 400 MG CR tablet, Take 400 mg by mouth in the morning and at bedtime., Disp: , Rfl: 11   phentermine 37.5 MG capsule, Take 1 capsule (37.5 mg total) by mouth every morning., Disp: 90 capsule, Rfl: 0   promethazine (PHENERGAN) 25 MG tablet, TAKE 1 TABLET BY MOUTH EVERY 6 HOURS AS NEEDED FOR NAUSEA AND VOMITING, Disp: 45 tablet, Rfl: 3   rosuvastatin (CRESTOR) 20 MG tablet, TAKE 1 TABLET BY MOUTH EVERY DAY, Disp: 90 tablet, Rfl: 2   senna-docusate (SENOKOT-S) 8.6-50 MG tablet, Take 1 tablet by mouth at bedtime., Disp: 30 tablet, Rfl: 0   SKYRIZI, 150 MG DOSE, 75 MG/0.83ML PSKT, Inject 150 mg into the skin every 3 (three) months. , Disp: , Rfl:    SUMAtriptan (IMITREX) 100 MG tablet, TAKE 1 TABLET BY MOUTH AT migraine ONSET, MAY REPEAT in 2 hours IF HEADACHE persists OR  recurs, Disp: 10 tablet, Rfl: 1  Allergies  Allergen Reactions   Niacin Anaphylaxis, Swelling and Other (See Comments)    REACTION: Swelling, problems breathing   Sulfamethoxazole-Trimethoprim Anaphylaxis, Swelling, Rash and Other (See Comments)    REACTION: Rash, throat closed, eyes swelled.   Aspirin Hives   Bactrim Hives   Benzoin Compound Rash   Cephalexin Hives    Tolerated ceftriaxone and cefepime 07/2019   Clindamycin/Lincomycin Hives   Iohexol Hives    Pt treated with PO benedryl   Lisinopril Hives   Naproxen Hives   Pnu-Imune [Pneumococcal Polysaccharide Vaccine] Rash   Sulfonamide Derivatives Rash    Objective:   Ht 5' 2.5" (  1.588 m)    Wt 282 lb (127.9 kg) Comment: pt reported   BMI 50.76 kg/m   AAOx3, NAD Obese NCAT, EOMI No obvious CN deficits Coloring WNL Pt is able to speak clearly, coherently without shortness of breath or increased work of breathing.  Thought process is linear.  Mood is appropriate.   Assessment and Plan:   R arm injury- new.  Pt reports she got 'tangled' in her Rollator walker and her arm got pulled and stretched as she fell.  Has seen chiropractor who is requiring xrays before he will do any treatment.  Pt would like to proceed w/ xrays.  Orders entered for [place].  [name], MD 04/27/2021

## 2021-04-28 ENCOUNTER — Ambulatory Visit (HOSPITAL_BASED_OUTPATIENT_CLINIC_OR_DEPARTMENT_OTHER)
Admission: RE | Admit: 2021-04-28 | Discharge: 2021-04-28 | Disposition: A | Discharge status: Home / Self Care | Payer: Medicare Other | Source: Ambulatory Visit | Attending: Family Medicine | Admitting: Family Medicine

## 2021-04-28 ENCOUNTER — Other Ambulatory Visit: Payer: Self-pay | Admitting: Family Medicine

## 2021-04-28 ENCOUNTER — Other Ambulatory Visit: Payer: Self-pay

## 2021-04-28 DIAGNOSIS — M1990 Unspecified osteoarthritis, unspecified site: Principal | ICD-10-CM

## 2021-04-28 DIAGNOSIS — S4991XA Unspecified injury of right shoulder and upper arm, initial encounter: Secondary | ICD-10-CM | POA: Insufficient documentation

## 2021-04-28 DIAGNOSIS — M7989 Other specified soft tissue disorders: Secondary | ICD-10-CM | POA: Diagnosis not present

## 2021-04-28 DIAGNOSIS — S6991XA Unspecified injury of right wrist, hand and finger(s), initial encounter: Secondary | ICD-10-CM | POA: Diagnosis not present

## 2021-04-28 MED ORDER — DICLOFENAC 1 % TOPICAL GEL
Freq: Four times a day (QID) | TOPICAL | 5 refills | 38 days | Status: CP
Start: 2021-04-28 — End: ?

## 2021-04-28 NOTE — Unmapped (Signed)
diclofenac sodium (VOLTAREN) 1 % gel    Last Visit Date: 03/11/2021  Next Visit Date: 08/12/2021    Lab Results   Component Value Date    ALT 8 (L) 07/31/2020    AST 17 07/31/2020    ALBUMIN 3.5 07/31/2020    CREATININE 0.49 (L) 03/11/2021     Lab Results   Component Value Date    WBC 8.7 03/11/2021    HGB 11.6 03/11/2021    HCT 35.6 03/11/2021    PLT 555 (H) 03/11/2021     Lab Results   Component Value Date    NEUTROPCT 73.9 03/11/2021    LYMPHOPCT 11.3 03/11/2021    MONOPCT 11.0 03/11/2021    EOSPCT 3.0 03/11/2021    BASOPCT 0.8 03/11/2021

## 2021-04-29 ENCOUNTER — Telehealth: Payer: Self-pay

## 2021-04-29 NOTE — Telephone Encounter (Signed)
Caller name:Porche Perkinson   On DPR? :Yes  Call back [place]  Provider they see: Birdie Riddle   Reason for call: [facility]ractor Dr Audelia Hives that pt was sent to needs image of the spine as well

## 2021-04-29 NOTE — Telephone Encounter (Signed)
Dr Audelia Hives has requested you order imaging of [name] spine as well for reference

## 2021-04-30 ENCOUNTER — Encounter (HOSPITAL_BASED_OUTPATIENT_CLINIC_OR_DEPARTMENT_OTHER): Payer: Medicare Other | Admitting: Internal Medicine

## 2021-04-30 ENCOUNTER — Telehealth: Payer: Self-pay

## 2021-04-30 NOTE — Telephone Encounter (Signed)
Patient is aware of xrays. She questioned Dr Birdie Riddle could order spine xrays as well. She stated that her chiropractor wanted those as well, and he ordered them but they haven't been done and was stated they didn't receive the order. She wanted to know If Dr Birdie Riddle could just do those since it seems they can be done more efficiently.

## 2021-04-30 NOTE — Telephone Encounter (Signed)
-----   Message from [name], MD sent at 04/30/2021  7:22 AM EST ----- Normal wrist xray

## 2021-05-03 ENCOUNTER — Other Ambulatory Visit (HOSPITAL_BASED_OUTPATIENT_CLINIC_OR_DEPARTMENT_OTHER): Payer: Self-pay | Admitting: Chiropractor

## 2021-05-03 ENCOUNTER — Other Ambulatory Visit: Payer: Self-pay

## 2021-05-03 ENCOUNTER — Ambulatory Visit (HOSPITAL_BASED_OUTPATIENT_CLINIC_OR_DEPARTMENT_OTHER)
Admission: RE | Admit: 2021-05-03 | Discharge: 2021-05-03 | Disposition: A | Discharge status: Home / Self Care | Payer: Medicare Other | Source: Ambulatory Visit | Attending: Chiropractor | Admitting: Chiropractor

## 2021-05-03 DIAGNOSIS — M546 Pain in thoracic spine: Secondary | ICD-10-CM | POA: Diagnosis not present

## 2021-05-03 DIAGNOSIS — M9903 Segmental and somatic dysfunction of lumbar region: Secondary | ICD-10-CM

## 2021-05-03 DIAGNOSIS — M545 Low back pain, unspecified: Secondary | ICD-10-CM | POA: Diagnosis not present

## 2021-05-03 DIAGNOSIS — M8588 Other specified disorders of bone density and structure, other site: Secondary | ICD-10-CM | POA: Diagnosis not present

## 2021-05-04 ENCOUNTER — Encounter (HOSPITAL_BASED_OUTPATIENT_CLINIC_OR_DEPARTMENT_OTHER): Payer: Medicare Other | Admitting: Internal Medicine

## 2021-05-06 ENCOUNTER — Other Ambulatory Visit: Payer: Self-pay | Admitting: Family Medicine

## 2021-05-06 ENCOUNTER — Telehealth: Payer: Self-pay

## 2021-05-06 DIAGNOSIS — L409 Psoriasis, unspecified: Principal | ICD-10-CM

## 2021-05-06 MED ORDER — SKYRIZI 150 MG/ML SUBCUTANEOUS SYRINGE
0 refills | 0 days
Start: 2021-05-06 — End: ?

## 2021-05-06 NOTE — Telephone Encounter (Signed)
Caller name:Blakeley Plaut   On DPR? :Yes  Call back [place]  Provider they see: Birdie Riddle   Reason for call: Pt went to chiropractor and was told that she need a referral to neurology pt has a positive babinskie test. Chiropractor was saying it was urgent so she is able to make a full recovery when addressed.

## 2021-05-06 NOTE — Unmapped (Signed)
Autumn Mcdonald called in to report she did not receive her last dose, delivered on 10/6. I tracked the package and [employer] indicated it was delivered. I encouraged [name] to call within a few days if the medication doesn't arrive as expected.     Unfortunately her Skyrizi prescription is out of refills, and she's not been seen by clinic in over a year. I offered we could check with clinic, but she may need to wait until her f/u in Feb.    She indicated she was having some flares, and that topicals aren't usually helpful for her.    I offered we could follow up after checking with clinic.     [name], PharmD, BCPS - Pharmacist   [facility]

## 2021-05-07 ENCOUNTER — Encounter: Payer: Self-pay | Admitting: Family Medicine

## 2021-05-07 ENCOUNTER — Telehealth (INDEPENDENT_AMBULATORY_CARE_PROVIDER_SITE_OTHER): Payer: Medicare Other | Admitting: Family Medicine

## 2021-05-07 DIAGNOSIS — L409 Psoriasis, unspecified: Principal | ICD-10-CM

## 2021-05-07 DIAGNOSIS — R292 Abnormal reflex: Secondary | ICD-10-CM

## 2021-05-07 DIAGNOSIS — L03115 Cellulitis of right lower limb: Secondary | ICD-10-CM | POA: Diagnosis not present

## 2021-05-07 DIAGNOSIS — S22068A Other fracture of T7-T8 thoracic vertebra, initial encounter for closed fracture: Secondary | ICD-10-CM | POA: Diagnosis not present

## 2021-05-07 MED ORDER — SKYRIZI 150 MG/ML SUBCUTANEOUS SYRINGE
SUBCUTANEOUS | 0 refills | 0.00000 days | Status: CP
Start: 2021-05-07 — End: ?
  Filled 2021-05-12: qty 1, 84d supply, fill #0

## 2021-05-07 NOTE — Progress Notes (Signed)
Virtual Visit via Video   I connected with patient on 05/07/21 at  9:30 AM EST by a video enabled telemedicine application and verified that I am speaking with the correct person using two identifiers.  Location patient: Home Location provider: [facility], Office Persons participating in the virtual visit: Patient, Provider, [place] Claiborne Billings C)  I discussed the limitations of evaluation and management by telemedicine and the availability of in person appointments. The patient expressed understanding and agreed to proceed.  Subjective:   HPI:   + babinski- pt went to chiropractor and was told she needed an adjustment.  Pt was told yesterday that she had a + Babinski test and needed neurology referral.  L side only.  Spinal fx at T7 and T12 based on xrays done at [facility].  These were read as old fxs.  Needs DEXA.   ROS:   See pertinent positives and negatives per HPI.  Patient Active Problem List   Diagnosis Date Noted   Cervical atypia 01/06/2021   Steatosis of liver 01/06/2021   Hyponatremia 07/27/2019   Urinary urgency 04/04/2018   Chronic narcotic use 09/07/2016   Acute blood loss anemia 01/14/2016   Multiple gastric ulcers    PAN (polyarteritis nodosa) ([place]) 11/24/2015   GERD (gastroesophageal reflux disease) 01/08/2014   Routine general medical examination at a health care facility 04/23/2013   Bariatric surgery status 10/31/2012   S/P skin biopsy 10/31/2012   Psoriasis 01/09/2012   DDD (degenerative disc disease), lumbar 11/30/2011   Migraine headache    [name] (obstructive sleep apnea) 11/16/2010   HYPERGLYCEMIA, FASTING 10/08/2009   CONTRACTURE OF TENDON 08/17/2009   PARESTHESIA, HANDS 12/23/2008   Leg pain, right 11/05/2008   ADVERSE DRUG REACTION 04/03/2008   PERIPHERAL EDEMA 03/26/2008   Morbid obesity ([place]) 01/03/2007   Cellulitis 01/03/2007   Hyperlipidemia 09/22/2006   Fibromyalgia 09/20/2006   Anxiety and depression 06/28/2006    Social  History   Tobacco Use   Smoking status: Never   Smokeless tobacco: Never  Substance Use Topics   Alcohol use: No    Current Outpatient Medications:    ALPRAZolam (XANAX) 0.5 MG tablet, TAKE 1 TABLET BY MOUTH 2 TIMES DAILY AS NEEDED FOR ANXIETY, Disp: 30 tablet, Rfl: 0   ARIPiprazole (ABILIFY) 10 MG tablet, Take 10 mg by mouth in the morning. , Disp: , Rfl:    Armodafinil 250 MG tablet, 3 (three) times daily. 8am, 2pm, 6pm, Disp: , Rfl:    Biotin 10 MG TABS, Take 10 mg by mouth in the morning. , Disp: , Rfl:    buPROPion (WELLBUTRIN XL) 300 MG 24 hr tablet, TAKE 1 TABLET BY MOUTH EVERY DAY, Disp: 30 tablet, Rfl: 2   busPIRone (BUSPAR) 30 MG tablet, TAKE 1 TABLET BY MOUTH 2 TIMES DAILY, Disp: 60 tablet, Rfl: 2   calcium citrate (CALCITRATE - DOSED IN MG ELEMENTAL CALCIUM) 950 MG tablet, Take 1 tablet by mouth daily., Disp: , Rfl:    clobetasol cream (TEMOVATE) 5.68 %, Apply 1 application topically 2 (two) times daily. Cover both legs with Colbetasol cream, Disp: 30 g, Rfl: 0   clonazePAM (KLONOPIN) 1 MG tablet, TAKE 1 TABLET BY MOUTH AT BEDTIME, Disp: 30 tablet, Rfl: 3   collagenase (SANTYL) ointment, Apply topically daily. apply Santyl to scabs on legs, Disp: 15 g, Rfl: 0   cyclobenzaprine (FLEXERIL) 10 MG tablet, TAKE 1 TABLET BY MOUTH 3 TIMES DAILY AS NEEDED, Disp: 270 tablet, Rfl: 2   doxycycline (VIBRA-TABS) 100 MG  tablet, Take 1 tablet (100 mg total) by mouth 2 (two) times daily., Disp: 14 tablet, Rfl: 0   DULoxetine (CYMBALTA) 60 MG capsule, Take 2 capsules (120 mg total) by mouth daily., Disp: 240 capsule, Rfl: 2   FEROSUL 325 (65 Fe) MG tablet, TAKE 1 TABLET BY MOUTH EVERY DAY WITH BREAKFAST, Disp: 30 tablet, Rfl: 2   fluconazole (DIFLUCAN) 100 MG tablet, Take 1 tablet (100 mg total) by mouth daily., Disp: 7 tablet, Rfl: 0   furosemide (LASIX) 40 MG tablet, TAKE 1 TABLET BY MOUTH EVERY DAY. MAY take additional TABLET IF weight INCREASE by 5lb AND call MD FOR futher instructions, Disp:  30 tablet, Rfl: 1   gabapentin (NEURONTIN) 600 MG tablet, Take 1 tablet (600 mg total) by mouth 3 (three) times daily. TAKE 2 TABLETS BY MOUTH 3 TIMES DAILY, Disp: 270 tablet, Rfl: 0   HYDROcodone-acetaminophen (NORCO) 10-325 MG tablet, TAKE 1 TABLET BY MOUTH EVERY 6 HOURS AS NEEDED, Disp: 90 tablet, Rfl: 0   irbesartan (AVAPRO) 75 MG tablet, TAKE 1 TABLET BY MOUTH EVERY DAY, Disp: 30 tablet, Rfl: 0   meclizine (ANTIVERT) 25 MG tablet, Take 1 tablet (25 mg total) by mouth 3 (three) times daily as needed for dizziness., Disp: 30 tablet, Rfl: 0   mupirocin ointment (BACTROBAN) 2 %, APPLY TOPICALLY 2 TIMES DAILY, Disp: 22 g, Rfl: 1   nebivolol (BYSTOLIC) 10 MG tablet, TAKE 1 TABLET BY MOUTH EVERY DAY, Disp: 30 tablet, Rfl: 2   nystatin (MYCOSTATIN/NYSTOP) powder, Apply 1 application topically 3 (three) times daily., Disp: 60 g, Rfl: 3   Oxcarbazepine (TRILEPTAL) 300 MG tablet, Take 300 mg by mouth 2 (two) times daily., Disp: , Rfl:    pantoprazole (PROTONIX) 40 MG tablet, TAKE 1 TABLET BY MOUTH 2 TIMES DAILY, Disp: 60 tablet, Rfl: 2   pentoxifylline (TRENTAL) 400 MG CR tablet, Take 400 mg by mouth in the morning and at bedtime., Disp: , Rfl: 11   phentermine 37.5 MG capsule, Take 1 capsule (37.5 mg total) by mouth every morning., Disp: 90 capsule, Rfl: 0   promethazine (PHENERGAN) 25 MG tablet, TAKE 1 TABLET BY MOUTH EVERY 6 HOURS AS NEEDED FOR NAUSEA AND VOMITING, Disp: 45 tablet, Rfl: 3   rosuvastatin (CRESTOR) 20 MG tablet, TAKE 1 TABLET BY MOUTH EVERY DAY, Disp: 90 tablet, Rfl: 2   senna-docusate (SENOKOT-S) 8.6-50 MG tablet, Take 1 tablet by mouth at bedtime., Disp: 30 tablet, Rfl: 0   SKYRIZI, 150 MG DOSE, 75 MG/0.83ML PSKT, Inject 150 mg into the skin every 3 (three) months. , Disp: , Rfl:    SUMAtriptan (IMITREX) 100 MG tablet, TAKE 1 TABLET BY MOUTH AT migraine ONSET, MAY REPEAT in 2 hours IF HEADACHE persists OR recurs, Disp: 10 tablet, Rfl: 1  Allergies  Allergen Reactions   Niacin  Anaphylaxis, Swelling and Other (See Comments)    REACTION: Swelling, problems breathing   Sulfamethoxazole-Trimethoprim Anaphylaxis, Swelling, Rash and Other (See Comments)    REACTION: Rash, throat closed, eyes swelled.   Aspirin Hives   Bactrim Hives   Benzoin Compound Rash   Cephalexin Hives    Tolerated ceftriaxone and cefepime 07/2019   Clindamycin/Lincomycin Hives   Iohexol Hives    Pt treated with PO benedryl   Lisinopril Hives   Naproxen Hives   Pnu-Imune [Pneumococcal Polysaccharide Vaccine] Rash   Sulfonamide Derivatives Rash    Objective:   There were no vitals taken for this visit. AAOx3, NAD NCAT, EOMI Obese No obvious CN deficits Coloring  WNL Pt is able to speak without shortness of breath or increased work of breathing.  Thought process is linear.  Mood is appropriate.   Assessment and Plan:   + Babinski- new.  L sided.  Chiropractor wanted immediate neuro referral.  Unfortunately since pt made a virtual visit I am not able to perform my own exam.  Referral placed.  Spinal fx- new to provider.  Chiropractor reported fxs of T7 and T12.  These were read as old but given that there are multiple, concern for osteoporosis.  Will get DEXA and determine next steps.  Pt expressed understanding and is in agreement w/ plan.    [name], MD 05/07/2021

## 2021-05-07 NOTE — Telephone Encounter (Signed)
Will be addressed at time of video visit today

## 2021-05-11 DIAGNOSIS — L409 Psoriasis, unspecified: Principal | ICD-10-CM

## 2021-05-11 NOTE — Unmapped (Signed)
[facility] Refill Coordination Note    Specialty Medication(s) to be Shipped:   Inflammatory Disorders: Skyrizi    Other medication(s) to be shipped: No additional medications requested for fill at this time     Autumn Mcdonald, DOB: 09/24/1970  Phone: [phone] (home)       All above HIPAA information was verified with patient.     Was a Nurse, learning disability used for this call? No    Completed refill call assessment today to schedule patient's medication shipment from the [facility] [phone]).  All relevant notes have been reviewed.     Specialty medication(s) and dose(s) confirmed: Regimen is correct and unchanged.   Changes to medications: Autumn Mcdonald reports no changes at this time.  Changes to insurance: No  New side effects reported not previously addressed with a pharmacist or physician: None reported  Questions for the pharmacist: No    Confirmed patient received a Conservation officer, historic buildings and a Surveyor, mining with first shipment. The patient will receive a drug information handout for each medication shipped and additional FDA Medication Guides as required.       DISEASE/MEDICATION-SPECIFIC INFORMATION        For patients on injectable medications: Patient currently has 0 doses left.  Next injection is scheduled for 05/13/2021.    SPECIALTY MEDICATION ADHERENCE     Medication Adherence    Patient reported X missed doses in the last month: 0  Specialty Medication: Skyrizi 150 mg/ml  Patient is on additional specialty medications: No  Any gaps in refill history greater than 2 weeks in the last 3 months: no  Demonstrates understanding of importance of adherence: yes  Informant: patient  Reliability of informant: reliable  Confirmed plan for next specialty medication refill: delivery by pharmacy  Refills needed for supportive medications: not needed              Were doses missed due to medication being on hold? No    Skyrizi 150 mg/ml: 0 days of medicine on hand       REFERRAL TO PHARMACIST     Referral to the pharmacist: Not needed      [facility]     Shipping address confirmed in Epic.     Delivery Scheduled: Yes, Expected medication delivery date: 05/13/2021.     Medication will be delivered via UPS to the prescription address in Epic WAM.    Autumn Mcdonald   [facility] Specialty Technician

## 2021-05-13 ENCOUNTER — Encounter (HOSPITAL_BASED_OUTPATIENT_CLINIC_OR_DEPARTMENT_OTHER): Payer: Medicare Other | Admitting: Internal Medicine

## 2021-05-18 ENCOUNTER — Telehealth: Payer: Self-pay

## 2021-05-18 ENCOUNTER — Ambulatory Visit: Payer: Medicare HMO | Admitting: Diagnostic Neuroimaging

## 2021-05-18 NOTE — Telephone Encounter (Signed)
Requesting order for Hoist as well as PT, can you accommodate these requests or do you needs other information?

## 2021-05-18 NOTE — Telephone Encounter (Signed)
Caller name:Ollie Stoermer   On DPR? :yes  Call back [place]  Provider they see: Birdie Riddle   Reason for call:Pt needs a hoist ordered pt has the swing but needs the hoist. Neurologist reccommended PT as well

## 2021-05-18 NOTE — Telephone Encounter (Signed)
Spoke to patient and made her aware, she will ask them at her next appt with them on the 6th

## 2021-05-18 NOTE — Telephone Encounter (Signed)
If neurology has recommended PT, typically they order it.  Also, if they recommended the Hoist that should be ordered by them.  There are different options- sit to stand lift or a sling lift- and I'm not sure what they prefer

## 2021-05-19 ENCOUNTER — Other Ambulatory Visit: Payer: Self-pay | Admitting: Family Medicine

## 2021-05-19 DIAGNOSIS — F32A Depression, unspecified: Secondary | ICD-10-CM

## 2021-05-20 ENCOUNTER — Other Ambulatory Visit: Payer: Self-pay | Admitting: Family

## 2021-05-20 NOTE — Telephone Encounter (Signed)
Patient is requesting a refill of the following medications: Requested Prescriptions   Pending Prescriptions Disp Refills   clonazePAM (KLONOPIN) 1 MG tablet [Pharmacy Med Name: clonazepam 1 mg tablet] 30 tablet 3    Sig: TAKE 1 TABLET BY MOUTH AT BEDTIME    Date of patient request: 05/20/2021 Last office visit: 05/07/2021 video Date of last refill: 01/21/2021 Last refill amount: 30 tablets 3 refills  Follow up time period per chart: n/a

## 2021-05-21 ENCOUNTER — Telehealth: Payer: Self-pay | Admitting: Diagnostic Neuroimaging

## 2021-05-21 ENCOUNTER — Ambulatory Visit: Payer: Self-pay | Admitting: Diagnostic Neuroimaging

## 2021-05-21 DIAGNOSIS — R69 Illness, unspecified: Secondary | ICD-10-CM | POA: Diagnosis not present

## 2021-05-21 NOTE — Telephone Encounter (Signed)
Pt transportation was not wheelchair accessible, rescheduled appt

## 2021-05-21 NOTE — Telephone Encounter (Signed)
Noted  

## 2021-05-31 ENCOUNTER — Encounter: Payer: Self-pay | Admitting: Family Medicine

## 2021-05-31 ENCOUNTER — Telehealth (INDEPENDENT_AMBULATORY_CARE_PROVIDER_SITE_OTHER): Payer: Medicare Other | Admitting: Family Medicine

## 2021-05-31 DIAGNOSIS — W19XXXD Unspecified fall, subsequent encounter: Secondary | ICD-10-CM | POA: Diagnosis not present

## 2021-05-31 DIAGNOSIS — Z9189 Other specified personal risk factors, not elsewhere classified: Secondary | ICD-10-CM

## 2021-05-31 DIAGNOSIS — Z7401 Bed confinement status: Secondary | ICD-10-CM | POA: Diagnosis not present

## 2021-05-31 NOTE — Progress Notes (Signed)
Virtual Visit via Video   I connected with patient on 05/31/21 at 12:30 PM EST by a video enabled telemedicine application and verified that I am speaking with the correct person using two identifiers.  Location patient: Home Location provider: [facility], Office Persons participating in the virtual visit: Patient, Provider, [place] Claiborne Billings C)  I discussed the limitations of evaluation and management by telemedicine and the availability of in person appointments. The patient expressed understanding and agreed to proceed.  Subjective:   HPI:   Fall at home- pt fell using her Rollator over Thanksgiving and has had progressive difficulty w/ ambulation.  Has not been able to get out.  Is sleeping in her lift chair.  Not able to get out for appts- had to reschedule her Neuro appt due to lack of mobility and transportation.  If son is available to help, she is able to use bedside commode but otherwise relies of pads.  ROS:   See pertinent positives and negatives per HPI.  Patient Active Problem List   Diagnosis Date Noted   Cervical atypia 01/06/2021   Steatosis of liver 01/06/2021   Hyponatremia 07/27/2019   Urinary urgency 04/04/2018   Chronic narcotic use 09/07/2016   Acute blood loss anemia 01/14/2016   Multiple gastric ulcers    PAN (polyarteritis nodosa) ([place]) 11/24/2015   GERD (gastroesophageal reflux disease) 01/08/2014   Routine general medical examination at a health care facility 04/23/2013   Bariatric surgery status 10/31/2012   S/P skin biopsy 10/31/2012   Psoriasis 01/09/2012   DDD (degenerative disc disease), lumbar 11/30/2011   Migraine headache    [name] (obstructive sleep apnea) 11/16/2010   HYPERGLYCEMIA, FASTING 10/08/2009   CONTRACTURE OF TENDON 08/17/2009   PARESTHESIA, HANDS 12/23/2008   Leg pain, right 11/05/2008   ADVERSE DRUG REACTION 04/03/2008   PERIPHERAL EDEMA 03/26/2008   Morbid obesity ([place]) 01/03/2007   Cellulitis 01/03/2007    Hyperlipidemia 09/22/2006   Fibromyalgia 09/20/2006   Anxiety and depression 06/28/2006    Social History   Tobacco Use   Smoking status: Never   Smokeless tobacco: Never  Substance Use Topics   Alcohol use: No    Current Outpatient Medications:    ALPRAZolam (XANAX) 0.5 MG tablet, TAKE 1 TABLET BY MOUTH 2 TIMES DAILY AS NEEDED FOR ANXIETY, Disp: 30 tablet, Rfl: 0   ARIPiprazole (ABILIFY) 10 MG tablet, Take 10 mg by mouth in the morning. , Disp: , Rfl:    Armodafinil 250 MG tablet, 3 (three) times daily. 8am, 2pm, 6pm, Disp: , Rfl:    Biotin 10 MG TABS, Take 10 mg by mouth in the morning. , Disp: , Rfl:    buPROPion (WELLBUTRIN XL) 300 MG 24 hr tablet, TAKE 1 TABLET BY MOUTH EVERY DAY, Disp: 30 tablet, Rfl: 2   busPIRone (BUSPAR) 30 MG tablet, TAKE 1 TABLET BY MOUTH 2 TIMES DAILY, Disp: 60 tablet, Rfl: 2   calcium citrate (CALCITRATE - DOSED IN MG ELEMENTAL CALCIUM) 950 MG tablet, Take 1 tablet by mouth daily., Disp: , Rfl:    clobetasol cream (TEMOVATE) 6.80 %, Apply 1 application topically 2 (two) times daily. Cover both legs with Colbetasol cream, Disp: 30 g, Rfl: 0   clonazePAM (KLONOPIN) 1 MG tablet, TAKE 1 TABLET BY MOUTH AT BEDTIME, Disp: 30 tablet, Rfl: 3   collagenase (SANTYL) ointment, Apply topically daily. apply Santyl to scabs on legs, Disp: 15 g, Rfl: 0   cyclobenzaprine (FLEXERIL) 10 MG tablet, TAKE 1 TABLET BY MOUTH 3 TIMES  DAILY AS NEEDED, Disp: 270 tablet, Rfl: 2   DULoxetine (CYMBALTA) 60 MG capsule, Take 2 capsules (120 mg total) by mouth daily., Disp: 240 capsule, Rfl: 2   FEROSUL 325 (65 Fe) MG tablet, TAKE 1 TABLET BY MOUTH EVERY DAY WITH BREAKFAST, Disp: 30 tablet, Rfl: 2   furosemide (LASIX) 40 MG tablet, TAKE 1 TABLET BY MOUTH EVERY DAY MAY take additional TABLET IF weight INCREASE by 5lb AND call MD FOR further instructions, Disp: 30 tablet, Rfl: 1   gabapentin (NEURONTIN) 600 MG tablet, Take 1 tablet (600 mg total) by mouth 3 (three) times daily. TAKE 2 TABLETS  BY MOUTH 3 TIMES DAILY, Disp: 270 tablet, Rfl: 0   HYDROcodone-acetaminophen (NORCO) 10-325 MG tablet, TAKE 1 TABLET BY MOUTH EVERY 6 HOURS AS NEEDED, Disp: 90 tablet, Rfl: 0   meclizine (ANTIVERT) 25 MG tablet, Take 1 tablet (25 mg total) by mouth 3 (three) times daily as needed for dizziness., Disp: 30 tablet, Rfl: 0   mupirocin ointment (BACTROBAN) 2 %, APPLY TOPICALLY 2 TIMES DAILY, Disp: 22 g, Rfl: 1   nebivolol (BYSTOLIC) 10 MG tablet, TAKE 1 TABLET BY MOUTH EVERY DAY, Disp: 30 tablet, Rfl: 2   nystatin (MYCOSTATIN/NYSTOP) powder, Apply 1 application topically 3 (three) times daily., Disp: 60 g, Rfl: 3   Oxcarbazepine (TRILEPTAL) 300 MG tablet, Take 300 mg by mouth 2 (two) times daily., Disp: , Rfl:    pantoprazole (PROTONIX) 40 MG tablet, TAKE 1 TABLET BY MOUTH 2 TIMES DAILY, Disp: 60 tablet, Rfl: 2   pentoxifylline (TRENTAL) 400 MG CR tablet, Take 400 mg by mouth in the morning and at bedtime., Disp: , Rfl: 11   phentermine 37.5 MG capsule, TAKE 1 CAPSULE BY MOUTH EVERY MORNING, Disp: 90 capsule, Rfl: 0   promethazine (PHENERGAN) 25 MG tablet, TAKE 1 TABLET BY MOUTH EVERY 6 HOURS AS NEEDED FOR NAUSEA AND VOMITING, Disp: 45 tablet, Rfl: 3   rosuvastatin (CRESTOR) 20 MG tablet, TAKE 1 TABLET BY MOUTH EVERY DAY, Disp: 90 tablet, Rfl: 2   senna-docusate (SENOKOT-S) 8.6-50 MG tablet, Take 1 tablet by mouth at bedtime., Disp: 30 tablet, Rfl: 0   SKYRIZI, 150 MG DOSE, 75 MG/0.83ML PSKT, Inject 150 mg into the skin every 3 (three) months. , Disp: , Rfl:    SUMAtriptan (IMITREX) 100 MG tablet, TAKE 1 TABLET BY MOUTH AT migraine ONSET, MAY REPEAT in 2 hours IF HEADACHE persists OR recurs, Disp: 10 tablet, Rfl: 1   doxycycline (VIBRA-TABS) 100 MG tablet, Take 1 tablet (100 mg total) by mouth 2 (two) times daily. (Patient not taking: Reported on 05/31/2021), Disp: 14 tablet, Rfl: 0   fluconazole (DIFLUCAN) 100 MG tablet, Take 1 tablet (100 mg total) by mouth daily. (Patient not taking: Reported on  05/31/2021), Disp: 7 tablet, Rfl: 0   irbesartan (AVAPRO) 75 MG tablet, TAKE 1 TABLET BY MOUTH EVERY DAY (Patient not taking: Reported on 05/31/2021), Disp: 30 tablet, Rfl: 0  Allergies  Allergen Reactions   Niacin Anaphylaxis, Swelling and Other (See Comments)    REACTION: Swelling, problems breathing   Sulfamethoxazole-Trimethoprim Anaphylaxis, Swelling, Rash and Other (See Comments)    REACTION: Rash, throat closed, eyes swelled.   Aspirin Hives   Bactrim Hives   Benzoin Compound Rash   Cephalexin Hives    Tolerated ceftriaxone and cefepime 07/2019   Clindamycin/Lincomycin Hives   Iohexol Hives    Pt treated with PO benedryl   Lisinopril Hives   Naproxen Hives   Pnu-Imune [Pneumococcal Polysaccharide Vaccine] Rash  Sulfonamide Derivatives Rash    Objective:   There were no vitals taken for this visit. AAOx3, NAD Morbidly obese NCAT, EOMI No obvious CN deficits Coloring WNL Pt is able to speak clearly, coherently without shortness of breath or increased work of breathing.  Thought process is linear.  Mood is appropriate.   Assessment and Plan:   Fall/Bed bound/high risk of complications due to immobility- I was aware of the fall using her Rollator at Thanksgiving but was not aware that she has become progressively less mobile to the point that she is now living in her lift chair.  She is able to get to the bedside commode w/ assistance, otherwise she is just using pads/chuks.  She is not able to get out of the house for groceries and people are bringing food to her.  Based on this terrible situation, I am going to request [facility] PT, nursing, SW, and try to get her the necessary DME and accommodations.  Pt expressed understanding and is in agreement w/ plan.   [name], MD 05/31/2021

## 2021-06-02 ENCOUNTER — Telehealth: Payer: Self-pay

## 2021-06-02 NOTE — Telephone Encounter (Signed)
Patient was seen virtual appointment on 1/16

## 2021-06-02 NOTE — Telephone Encounter (Signed)
Caller name:[name]   On DPR? :Yes  Call back [place]  Provider they see: Birdie Riddle  Reason for call:Pt evaluated by [facility]  today [name] said pt needs to be put in rehab asap she is in poor condition she is developing ulcers on her hip pressure sores been in chair since November

## 2021-06-02 NOTE — Telephone Encounter (Signed)
Pt called back and she is requesting a Rehab bed ???

## 2021-06-03 NOTE — Telephone Encounter (Signed)
Left vm for patient to return my call. °

## 2021-06-03 NOTE — Telephone Encounter (Signed)
Unfortunately I cannot just admit pt to a rehab facility.  That has to be done upon discharge from the hospital/ER.  If she has pressure sores and other medical needs, she needs to go to the ER for evaluation/stabilization and there the case managers/social workers can help her with placement

## 2021-06-04 ENCOUNTER — Encounter (HOSPITAL_COMMUNITY): Payer: Self-pay

## 2021-06-04 ENCOUNTER — Emergency Department (HOSPITAL_COMMUNITY): Payer: Medicare Other

## 2021-06-04 ENCOUNTER — Observation Stay (HOSPITAL_COMMUNITY): Payer: Medicare Other

## 2021-06-04 ENCOUNTER — Other Ambulatory Visit: Payer: Self-pay

## 2021-06-04 ENCOUNTER — Inpatient Hospital Stay (HOSPITAL_COMMUNITY)
Admission: EM | Admit: 2021-06-04 | Discharge: 2021-06-10 | DRG: 092 | Disposition: A | Discharge status: Home Health | Payer: Medicare Other | Attending: Internal Medicine | Admitting: Internal Medicine

## 2021-06-04 DIAGNOSIS — Z8541 Personal history of malignant neoplasm of cervix uteri: Secondary | ICD-10-CM

## 2021-06-04 DIAGNOSIS — R531 Weakness: Secondary | ICD-10-CM | POA: Diagnosis not present

## 2021-06-04 DIAGNOSIS — G629 Polyneuropathy, unspecified: Secondary | ICD-10-CM | POA: Diagnosis present

## 2021-06-04 DIAGNOSIS — M2578 Osteophyte, vertebrae: Secondary | ICD-10-CM | POA: Diagnosis not present

## 2021-06-04 DIAGNOSIS — R296 Repeated falls: Secondary | ICD-10-CM | POA: Diagnosis not present

## 2021-06-04 DIAGNOSIS — E785 Hyperlipidemia, unspecified: Secondary | ICD-10-CM | POA: Diagnosis not present

## 2021-06-04 DIAGNOSIS — Z9884 Bariatric surgery status: Secondary | ICD-10-CM | POA: Diagnosis not present

## 2021-06-04 DIAGNOSIS — W19XXXA Unspecified fall, initial encounter: Secondary | ICD-10-CM | POA: Diagnosis not present

## 2021-06-04 DIAGNOSIS — Z20822 Contact with and (suspected) exposure to covid-19: Secondary | ICD-10-CM | POA: Diagnosis not present

## 2021-06-04 DIAGNOSIS — Z79899 Other long term (current) drug therapy: Secondary | ICD-10-CM

## 2021-06-04 DIAGNOSIS — Z8249 Family history of ischemic heart disease and other diseases of the circulatory system: Secondary | ICD-10-CM | POA: Diagnosis not present

## 2021-06-04 DIAGNOSIS — Z833 Family history of diabetes mellitus: Secondary | ICD-10-CM | POA: Diagnosis not present

## 2021-06-04 DIAGNOSIS — G4733 Obstructive sleep apnea (adult) (pediatric): Secondary | ICD-10-CM | POA: Diagnosis present

## 2021-06-04 DIAGNOSIS — M797 Fibromyalgia: Secondary | ICD-10-CM | POA: Diagnosis not present

## 2021-06-04 DIAGNOSIS — E538 Deficiency of other specified B group vitamins: Secondary | ICD-10-CM | POA: Diagnosis not present

## 2021-06-04 DIAGNOSIS — Z825 Family history of asthma and other chronic lower respiratory diseases: Secondary | ICD-10-CM | POA: Diagnosis not present

## 2021-06-04 DIAGNOSIS — E559 Vitamin D deficiency, unspecified: Secondary | ICD-10-CM | POA: Diagnosis not present

## 2021-06-04 DIAGNOSIS — I10 Essential (primary) hypertension: Secondary | ICD-10-CM | POA: Diagnosis present

## 2021-06-04 DIAGNOSIS — Z7401 Bed confinement status: Secondary | ICD-10-CM

## 2021-06-04 DIAGNOSIS — R29898 Other symptoms and signs involving the musculoskeletal system: Secondary | ICD-10-CM | POA: Diagnosis not present

## 2021-06-04 DIAGNOSIS — M542 Cervicalgia: Secondary | ICD-10-CM | POA: Diagnosis not present

## 2021-06-04 DIAGNOSIS — G9589 Other specified diseases of spinal cord: Principal | ICD-10-CM | POA: Diagnosis present

## 2021-06-04 DIAGNOSIS — Z882 Allergy status to sulfonamides status: Secondary | ICD-10-CM | POA: Diagnosis not present

## 2021-06-04 DIAGNOSIS — E531 Pyridoxine deficiency: Secondary | ICD-10-CM | POA: Diagnosis not present

## 2021-06-04 DIAGNOSIS — N390 Urinary tract infection, site not specified: Secondary | ICD-10-CM | POA: Diagnosis not present

## 2021-06-04 DIAGNOSIS — M4802 Spinal stenosis, cervical region: Secondary | ICD-10-CM | POA: Diagnosis not present

## 2021-06-04 DIAGNOSIS — Z6841 Body Mass Index (BMI) 40.0 and over, adult: Secondary | ICD-10-CM

## 2021-06-04 DIAGNOSIS — F419 Anxiety disorder, unspecified: Secondary | ICD-10-CM | POA: Diagnosis not present

## 2021-06-04 DIAGNOSIS — F32A Depression, unspecified: Secondary | ICD-10-CM | POA: Diagnosis present

## 2021-06-04 DIAGNOSIS — Z8042 Family history of malignant neoplasm of prostate: Secondary | ICD-10-CM

## 2021-06-04 DIAGNOSIS — M47812 Spondylosis without myelopathy or radiculopathy, cervical region: Secondary | ICD-10-CM | POA: Diagnosis not present

## 2021-06-04 DIAGNOSIS — R29818 Other symptoms and signs involving the nervous system: Secondary | ICD-10-CM | POA: Diagnosis not present

## 2021-06-04 DIAGNOSIS — Z888 Allergy status to other drugs, medicaments and biological substances status: Secondary | ICD-10-CM | POA: Diagnosis not present

## 2021-06-04 DIAGNOSIS — Z803 Family history of malignant neoplasm of breast: Secondary | ICD-10-CM

## 2021-06-04 DIAGNOSIS — Z743 Need for continuous supervision: Secondary | ICD-10-CM | POA: Diagnosis not present

## 2021-06-04 LAB — RESP PANEL BY RT-PCR (FLU A&B, COVID) ARPGX2
Influenza A by PCR: NEGATIVE
Influenza B by PCR: NEGATIVE
SARS Coronavirus 2 by RT PCR: NEGATIVE

## 2021-06-04 LAB — COMPREHENSIVE METABOLIC PANEL
ALT: 13 U/L (ref 0–44)
AST: 13 U/L — ABNORMAL LOW (ref 15–41)
Albumin: 2.8 g/dL — ABNORMAL LOW (ref 3.5–5.0)
Alkaline Phosphatase: 106 U/L (ref 38–126)
Anion gap: 7 (ref 5–15)
BUN: 8 mg/dL (ref 6–20)
CO2: 25 mmol/L (ref 22–32)
Calcium: 8.8 mg/dL — ABNORMAL LOW (ref 8.9–10.3)
Chloride: 103 mmol/L (ref 98–111)
Creatinine, Ser: 0.5 mg/dL (ref 0.44–1.00)
GFR, Estimated: >60 mL/min (ref >60)
Glucose, Bld: 90 mg/dL (ref 70–99)
Potassium: 4.1 mmol/L (ref 3.5–5.1)
Sodium: 135 mmol/L (ref 135–145)
Total Bilirubin: 0.2 mg/dL — ABNORMAL LOW (ref 0.3–1.2)
Total Protein: 6.5 g/dL (ref 6.5–8.1)

## 2021-06-04 LAB — CBC WITH DIFFERENTIAL/PLATELET
Abs Immature Granulocytes: 0.01 10*3/uL (ref 0.00–0.07)
Basophils Absolute: 0 10*3/uL (ref 0.0–0.1)
Basophils Relative: 1 %
Eosinophils Absolute: 0.5 10*3/uL (ref 0.0–0.5)
Eosinophils Relative: 8 %
HCT: 42.5 % (ref 36.0–46.0)
Hemoglobin: 13 g/dL (ref 12.0–15.0)
Immature Granulocytes: 0 %
Lymphocytes Relative: 24 %
Lymphs Abs: 1.3 10*3/uL (ref 0.7–4.0)
MCH: 28 pg (ref 26.0–34.0)
MCHC: 30.6 g/dL (ref 30.0–36.0)
MCV: 91.6 fL (ref 80.0–100.0)
Monocytes Absolute: 0.4 10*3/uL (ref 0.1–1.0)
Monocytes Relative: 7 %
Neutro Abs: 3.4 10*3/uL (ref 1.7–7.7)
Neutrophils Relative %: 60 %
Platelets: 278 10*3/uL (ref 150–400)
RBC: 4.64 MIL/uL (ref 3.87–5.11)
RDW: 14.7 % (ref 11.5–15.5)
WBC: 5.7 10*3/uL (ref 4.0–10.5)
nRBC: 0 % (ref 0.0–0.2)

## 2021-06-04 LAB — TSH: TSH: 2.1 u[IU]/mL (ref 0.350–4.500)

## 2021-06-04 LAB — MAGNESIUM: Magnesium: 1.8 mg/dL (ref 1.7–2.4)

## 2021-06-04 LAB — VITAMIN D 25 HYDROXY (VIT D DEFICIENCY, FRACTURES): Vit D, 25-Hydroxy: 29.91 ng/mL — ABNORMAL LOW (ref 30–100)

## 2021-06-04 LAB — VITAMIN B12: Vitamin B-12: 264 pg/mL (ref 180–914)

## 2021-06-04 MED ORDER — GADOBUTROL 1 MMOL/ML IV SOLN
10.0000 mL | Freq: Once | INTRAVENOUS | Status: AC | PRN
  Administered 2021-06-04: 10 mL via INTRAVENOUS

## 2021-06-04 MED ORDER — GABAPENTIN 600 MG PO TABS
600.0000 mg | ORAL_TABLET | Freq: Three times a day (TID) | ORAL | Status: DC
Start: 2021-06-04 — End: 2021-06-04

## 2021-06-04 MED ORDER — GABAPENTIN 600 MG PO TABS
1200.0000 mg | ORAL_TABLET | Freq: Every day | ORAL | Status: DC
  Administered 2021-06-04 – 2021-06-09 (×6): 1200 mg via ORAL
  Filled 2021-06-04 (×6): qty 2

## 2021-06-04 MED ORDER — ROSUVASTATIN CALCIUM 20 MG PO TABS
20.0000 mg | ORAL_TABLET | Freq: Every day | ORAL | Status: DC
  Administered 2021-06-05 – 2021-06-10 (×6): 20 mg via ORAL
  Filled 2021-06-04 (×6): qty 1

## 2021-06-04 MED ORDER — ACETAMINOPHEN 650 MG RE SUPP: 650.0000 mg | Freq: Four times a day (QID) | RECTAL | Status: DC | PRN

## 2021-06-04 MED ORDER — DULOXETINE HCL 60 MG PO CPEP
60.0000 mg | ORAL_CAPSULE | Freq: Two times a day (BID) | ORAL | Status: DC
  Administered 2021-06-04 – 2021-06-10 (×12): 60 mg via ORAL
  Filled 2021-06-04 (×12): qty 1

## 2021-06-04 MED ORDER — PENTOXIFYLLINE ER 400 MG PO TBCR
400.0000 mg | EXTENDED_RELEASE_TABLET | Freq: Three times a day (TID) | ORAL | Status: DC
  Administered 2021-06-05 – 2021-06-10 (×16): 400 mg via ORAL
  Filled 2021-06-04 (×17): qty 1

## 2021-06-04 MED ORDER — OXCARBAZEPINE 300 MG PO TABS
300.0000 mg | ORAL_TABLET | Freq: Two times a day (BID) | ORAL | Status: DC
Start: 2021-06-04 — End: 2021-06-10
  Administered 2021-06-04 – 2021-06-10 (×12): 300 mg via ORAL
  Filled 2021-06-04 (×12): qty 1

## 2021-06-04 MED ORDER — BUPROPION HCL ER (XL) 150 MG PO TB24
300.0000 mg | ORAL_TABLET | Freq: Every day | ORAL | Status: DC
  Administered 2021-06-05 – 2021-06-10 (×6): 300 mg via ORAL
  Filled 2021-06-04 (×6): qty 2

## 2021-06-04 MED ORDER — ENOXAPARIN SODIUM 80 MG/0.8ML IJ SOSY
0.5000 mg/kg | PREFILLED_SYRINGE | INTRAMUSCULAR | Status: DC
  Administered 2021-06-04 – 2021-06-09 (×6): 65 mg via SUBCUTANEOUS
  Filled 2021-06-04 (×7): qty 0.8

## 2021-06-04 MED ORDER — ARIPIPRAZOLE 10 MG PO TABS
10.0000 mg | ORAL_TABLET | Freq: Every morning | ORAL | Status: DC
  Administered 2021-06-05 – 2021-06-10 (×6): 10 mg via ORAL
  Filled 2021-06-04 (×6): qty 1

## 2021-06-04 MED ORDER — PANTOPRAZOLE SODIUM 40 MG PO TBEC
40.0000 mg | DELAYED_RELEASE_TABLET | Freq: Two times a day (BID) | ORAL | Status: DC
  Administered 2021-06-05 – 2021-06-10 (×11): 40 mg via ORAL
  Filled 2021-06-04 (×11): qty 1

## 2021-06-04 MED ORDER — ALPRAZOLAM 0.5 MG PO TABS
0.5000 mg | ORAL_TABLET | Freq: Two times a day (BID) | ORAL | Status: DC | PRN
  Administered 2021-06-04 – 2021-06-09 (×6): 0.5 mg via ORAL
  Filled 2021-06-04 (×7): qty 1

## 2021-06-04 MED ORDER — ENOXAPARIN SODIUM 40 MG/0.4ML IJ SOSY: 40.0000 mg | PREFILLED_SYRINGE | INTRAMUSCULAR | Status: DC

## 2021-06-04 MED ORDER — FERROUS SULFATE 325 (65 FE) MG PO TABS
325.0000 mg | ORAL_TABLET | Freq: Every day | ORAL | Status: DC
  Administered 2021-06-05 – 2021-06-10 (×6): 325 mg via ORAL
  Filled 2021-06-04 (×6): qty 1

## 2021-06-04 MED ORDER — BUSPIRONE HCL 10 MG PO TABS
30.0000 mg | ORAL_TABLET | Freq: Two times a day (BID) | ORAL | Status: DC
  Administered 2021-06-04 – 2021-06-10 (×12): 30 mg via ORAL
  Filled 2021-06-04 (×11): qty 3

## 2021-06-04 MED ORDER — FUROSEMIDE 40 MG PO TABS
40.0000 mg | ORAL_TABLET | Freq: Every day | ORAL | Status: DC
Start: 2021-06-04 — End: 2021-06-10
  Administered 2021-06-04 – 2021-06-09 (×6): 40 mg via ORAL
  Filled 2021-06-04 (×7): qty 1

## 2021-06-04 MED ORDER — GABAPENTIN 600 MG PO TABS
600.0000 mg | ORAL_TABLET | Freq: Two times a day (BID) | ORAL | Status: DC
  Administered 2021-06-05 – 2021-06-10 (×11): 600 mg via ORAL
  Filled 2021-06-04 (×11): qty 1

## 2021-06-04 MED ORDER — ACETAMINOPHEN 325 MG PO TABS
650.0000 mg | ORAL_TABLET | Freq: Four times a day (QID) | ORAL | Status: DC | PRN
  Administered 2021-06-05 – 2021-06-10 (×7): 650 mg via ORAL
  Filled 2021-06-04 (×6): qty 2

## 2021-06-04 MED ORDER — NEBIVOLOL HCL 10 MG PO TABS
10.0000 mg | ORAL_TABLET | Freq: Every day | ORAL | Status: DC
  Administered 2021-06-05 – 2021-06-10 (×6): 10 mg via ORAL
  Filled 2021-06-04 (×6): qty 1

## 2021-06-04 NOTE — H&P (Signed)
History and Physical    [name]:[id number] DOB: 1970-10-13 DOA: 06/04/2021  I have briefly reviewed the patient's prior medical records in [place]  PCP: [name], MD  Patient coming from: home  Chief Complaint: Progressive bilateral lower extremity weakness  HPI: [name] is a 51 y.o. female with medical history significant of obesity, class III, depression/anxiety, hypertension, hyperlipidemia, comes into the hospital with progressive lower extremity weakness.  Patient noted that she has had increased weakness in her legs since last Thanksgiving.  At times, she was so weak that had several falls, first 1 at the end of November.  She has been taking care of her mother in the past and had a wheelchair that she has been using more and more.  For the past several days to a week she could no longer get up and do any walking, can only get her lift chair.  She is not ambulatory anymore.  She feels that her right arm is a little bit weaker but that is from when she fell and her right arm was caught in a Rollator.  Denies any back pain.  She saw a chiropractor, also had a E-visit with her PCP.  She was scheduled to see neurology but was unable to go due to weakness.  She denies any fever or chills, no abdominal pain, no nausea or vomiting.  Other than the weakness she has no other complaints  ED Course: In the ED she is afebrile, normotensive, satting well on room air.  Her blood work is fairly unremarkable with normal renal function and normal CBC.  LFTs are normal.  Covid is negative.  CT of the head and C-spine MRI were without acute abnormalities.  Chest x-ray negative.  Neurology was consulted for progressive lower extremity weakness and we are asked to admit.  Review of Systems: All systems reviewed, and apart from HPI, all negative  Past Medical History:  Diagnosis Date   Allergic rhinitis    Ankle fracture, right    Anxiety    ASCUS of cervix with negative high  risk HPV 05/2018   Carpal tunnel syndrome    Cervical cancer ([place]) 1998   Fibromyalgia    Hyperlipidemia    Hypertension    Migraine headache    Obesity    Sleep apnea     Past Surgical History:  Procedure Laterality Date   ABDOMINAL HYSTERECTOMY  10/1996   ABDOMINAL SURGERY  jan/11/2012   gastric bypass surgery   CARPAL TUNNEL RELEASE     bilateral    CESAREAN SECTION     CHOLECYSTECTOMY  07/1992   ESOPHAGOGASTRODUODENOSCOPY N/A 01/14/2016   Procedure: ESOPHAGOGASTRODUODENOSCOPY (EGD);  Surgeon: Mauri Pole, MD;  Location: [facility] ENDOSCOPY;  Service: Endoscopy;  Laterality: N/A;     reports that she has never smoked. She has never used smokeless tobacco. She reports that she does not drink alcohol and does not use drugs.  Allergies  Allergen Reactions   Niacin Anaphylaxis, Swelling and Other (See Comments)    REACTION: Swelling, problems breathing   Sulfamethoxazole-Trimethoprim Anaphylaxis, Swelling, Rash and Other (See Comments)    REACTION: Rash, throat closed, eyes swelled.   Aspirin Hives   Bactrim Hives   Benzoin Compound Rash   Cephalexin Hives    Tolerated ceftriaxone and cefepime 07/2019   Clindamycin/Lincomycin Hives   Iohexol Hives    Pt treated with PO benedryl   Lisinopril Hives   Naproxen Hives   Pnu-Imune [Pneumococcal Polysaccharide  Vaccine] Rash   Sulfonamide Derivatives Rash    Family History  Problem Relation Age of Onset   Diabetes Mother    Emphysema Mother    Allergies Mother    Hypertension Mother    Heart disease Father    Allergies Father    Prostate cancer Father    Breast cancer Maternal Grandmother 46    Prior to Admission medications   Medication Sig Start Date End Date Taking? Authorizing Provider  ALPRAZolam Duanne Moron) 0.5 MG tablet TAKE 1 TABLET BY MOUTH 2 TIMES DAILY AS NEEDED FOR ANXIETY 05/19/21   [name], MD  ARIPiprazole (ABILIFY) 10 MG tablet Take 10 mg by mouth in the morning.     [provider]   Armodafinil 250 MG tablet 3 (three) times daily. 8am, 2pm, 6pm 11/12/19   [provider]  Biotin 10 MG TABS Take 10 mg by mouth in the morning.  07/25/18   [provider]  buPROPion (WELLBUTRIN XL) 300 MG 24 hr tablet TAKE 1 TABLET BY MOUTH EVERY DAY 04/30/21   [name], MD  busPIRone (BUSPAR) 30 MG tablet TAKE 1 TABLET BY MOUTH 2 TIMES DAILY 05/19/21   [name], MD  calcium citrate (CALCITRATE - DOSED IN MG ELEMENTAL CALCIUM) 950 MG tablet Take 1 tablet by mouth daily.    [provider]  clobetasol cream (TEMOVATE) 0.86 % Apply 1 application topically 2 (two) times daily. Cover both legs with Colbetasol cream 08/02/19   Florencia Reasons, MD  clonazePAM (KLONOPIN) 1 MG tablet TAKE 1 TABLET BY MOUTH AT BEDTIME 05/20/21   [name], MD  collagenase (SANTYL) ointment Apply topically daily. apply Santyl to scabs on legs 08/03/19   Florencia Reasons, MD  cyclobenzaprine (FLEXERIL) 10 MG tablet TAKE 1 TABLET BY MOUTH 3 TIMES DAILY AS NEEDED 12/22/20   Dutch Quint B, FNP  diclofenac Sodium (VOLTAREN) 1 % GEL Apply 4 g topically 4 (four) times daily. 02/11/21   [provider]  doxycycline (VIBRA-TABS) 100 MG tablet Take 1 tablet (100 mg total) by mouth 2 (two) times daily. Patient not taking: Reported on 05/31/2021 02/10/21   [name], MD  DULoxetine (CYMBALTA) 60 MG capsule Take 2 capsules (120 mg total) by mouth daily. 06/22/15   [name], MD  FEROSUL 325 (65 Fe) MG tablet TAKE 1 TABLET BY MOUTH EVERY DAY WITH BREAKFAST Patient taking differently: Take 325 mg by mouth daily with breakfast. 04/30/21   [name], MD  fluconazole (DIFLUCAN) 100 MG tablet Take 1 tablet (100 mg total) by mouth daily. Patient not taking: Reported on 05/31/2021 07/10/20   [name], MD  furosemide (LASIX) 40 MG tablet TAKE 1 TABLET BY MOUTH EVERY DAY MAY take additional TABLET IF weight INCREASE by 5lb AND call MD FOR further  instructions Patient taking differently: Take 40-80 mg by mouth daily. 1t qd-may take an additional tablet if weight increases by 5lbs. 05/19/21   [name], MD  gabapentin (NEURONTIN) 600 MG tablet Take 1 tablet (600 mg total) by mouth 3 (three) times daily. TAKE 2 TABLETS BY MOUTH 3 TIMES DAILY 07/09/20   [name], MD  HYDROcodone-acetaminophen [facility]) 10-325 MG tablet TAKE 1 TABLET BY MOUTH EVERY 6 HOURS AS NEEDED 05/07/21   [name], MD  irbesartan (AVAPRO) 75 MG tablet TAKE 1 TABLET BY MOUTH EVERY DAY Patient not taking: Reported on 05/31/2021 12/23/19   [name], MD  meclizine (ANTIVERT) 25  MG tablet Take 1 tablet (25 mg total) by mouth 3 (three) times daily as needed for dizziness. 07/18/16   [name], MD  mupirocin ointment (BACTROBAN) 2 % APPLY TOPICALLY 2 TIMES DAILY Patient taking differently: Apply 1 application topically 2 (two) times daily. 04/20/20   [name], MD  nebivolol (BYSTOLIC) 10 MG tablet TAKE 1 TABLET BY MOUTH EVERY DAY 04/30/21   [name], MD  nystatin (MYCOSTATIN/NYSTOP) powder Apply 1 application topically 3 (three) times daily. 03/17/20   [name], MD  Oxcarbazepine (TRILEPTAL) 300 MG tablet Take 300 mg by mouth 2 (two) times daily.    [provider]  pantoprazole (PROTONIX) 40 MG tablet TAKE 1 TABLET BY MOUTH 2 TIMES DAILY 04/30/21   [name], MD  pentoxifylline (TRENTAL) 400 MG CR tablet Take 400 mg by mouth in the morning and at bedtime. 03/22/17   [provider]  phentermine 37.5 MG capsule TAKE 1 CAPSULE BY MOUTH EVERY MORNING 05/19/21   [name], MD  promethazine (PHENERGAN) 25 MG tablet TAKE 1 TABLET BY MOUTH EVERY 6 HOURS AS NEEDED FOR NAUSEA AND VOMITING Patient taking differently: Take 25 mg by mouth every 6 (six) hours as needed for nausea or vomiting. 09/07/20   [name], MD  rosuvastatin (CRESTOR) 20 MG tablet TAKE 1 TABLET BY MOUTH  EVERY DAY 08/18/20   [name], MD  senna-docusate (SENOKOT-S) 8.6-50 MG tablet Take 1 tablet by mouth at bedtime. 08/02/19   Florencia Reasons, MD  SKYRIZI 150 MG/ML SOSY Inject into the skin. 02/15/21   [provider]  SKYRIZI, 150 MG DOSE, 75 MG/0.83ML PSKT Inject 150 mg into the skin every 3 (three) months.  04/07/19   [provider]  SUMAtriptan (IMITREX) 100 MG tablet TAKE 1 TABLET BY MOUTH AT migraine ONSET, MAY REPEAT in 2 hours IF HEADACHE persists OR recurs Patient taking differently: Take 100 mg by mouth every 2 (two) hours as needed for migraine. 01/19/21   Dutch Quint B, FNP    Physical Exam: Vitals:   06/04/21 1200 06/04/21 1330 06/04/21 1345 06/04/21 1400  BP:  120/74 113/67 106/67  Pulse:  69 69 71  Resp:  11 19 (!) 24  Temp: 98.3 F (36.8 C)     TempSrc: Oral     SpO2:  100% 99% 98%  Weight:      Height:        Constitutional: NAD, calm, comfortable Eyes: PERRL, lids and conjunctivae normal ENMT: Mucous membranes are moist. Posterior pharynx clear of any exudate or lesions. Neck: normal, supple Respiratory: clear to auscultation bilaterally, no wheezing, no crackles. Normal respiratory effort. No accessory muscle use.  Cardiovascular: Regular rate and rhythm, no murmurs / rubs / gallops. No extremity edema. 2+ pedal pulses.  Abdomen: no tenderness, no masses palpated. Bowel sounds positive.  Musculoskeletal: no clubbing / cyanosis. Normal muscle tone.  Skin: Mild chronic venous stasis changes bilateral lower extremities Neurologic: CN 2-12 grossly intact.  Good strength upper extremities, proximal muscle weakness bilateral lower extremities, 1 beat clonus bilaterally Psychiatric: Normal judgment and insight. Alert and oriented x 3.  Labs on Admission: I have personally reviewed following labs and imaging studies  CBC: Recent Labs  Lab 06/04/21 1202  WBC 5.7  NEUTROABS 3.4  HGB 13.0  HCT 42.5  MCV 91.6  PLT 938   Basic Metabolic  Panel: Recent Labs  Lab 06/04/21 1202  NA 135  K 4.1  CL 103  CO2 25  GLUCOSE 90  BUN 8  CREATININE 0.50  CALCIUM 8.8*  MG 1.8   Liver Function Tests: Recent Labs  Lab 06/04/21 1202  AST 13*  ALT 13  ALKPHOS 106  BILITOT 0.2*  PROT 6.5  ALBUMIN 2.8*   Coagulation Profile: No results for input(s): INR, PROTIME in the last 168 hours. BNP (last 3 results) No results for input(s): PROBNP in the last 8760 hours. CBG: No results for input(s): GLUCAP in the last 168 hours. Thyroid Function Tests: No results for input(s): TSH, T4TOTAL, FREET4, T3FREE, THYROIDAB in the last 72 hours. Urine analysis:    Component Value Date/Time   COLORURINE YELLOW 07/26/2019 1513   APPEARANCEUR CLEAR 07/26/2019 1513   LABSPEC 1.018 07/26/2019 1513   PHURINE 7.0 07/26/2019 1513   GLUCOSEU NEGATIVE 07/26/2019 1513   HGBUR NEGATIVE 07/26/2019 1513   HGBUR negative 01/05/2009 1414   BILIRUBINUR negative 10/05/2020 1139   KETONESUR NEGATIVE 07/26/2019 1513   PROTEINUR Positive (A) 10/05/2020 1139   PROTEINUR NEGATIVE 07/26/2019 1513   UROBILINOGEN 0.2 10/05/2020 1139   UROBILINOGEN 0.2 01/05/2009 1414   NITRITE positive 10/05/2020 1139   NITRITE NEGATIVE 07/26/2019 1513   LEUKOCYTESUR Small (1+) (A) 10/05/2020 1139   LEUKOCYTESUR NEGATIVE 07/26/2019 1513     Radiological Exams on Admission: CT HEAD WO CONTRAST (5MM)  Result Date: 06/04/2021 CLINICAL DATA:  Head and neck trauma from November. Weakness and focal neuro deficit. EXAM: CT HEAD WITHOUT CONTRAST CT CERVICAL SPINE WITHOUT CONTRAST TECHNIQUE: Multidetector CT imaging of the head and cervical spine was performed following the standard protocol without intravenous contrast. Multiplanar CT image reconstructions of the cervical spine were also generated. RADIATION DOSE REDUCTION: This exam was performed according to the departmental dose-optimization program which includes automated exposure control, adjustment of the mA and/or kV  according to patient size and/or use of iterative reconstruction technique. COMPARISON:  None. FINDINGS: CT HEAD FINDINGS Brain: No evidence of acute infarction, hemorrhage, hydrocephalus, extra-axial collection or mass lesion/mass effect. There is minimal chronic diffuse atrophy. Vascular: No hyperdense vessel is noted. Skull: Normal. Negative for fracture or focal lesion. Sinuses/Orbits: No acute finding. Other: None. CT CERVICAL SPINE FINDINGS Alignment: There is straightening of cervical spine either due to positioning or muscle spasm. Skull base and vertebrae: No acute fracture. No primary bone lesion or focal pathologic process. Soft tissues and spinal canal: No prevertebral fluid or swelling. No visible canal hematoma. Disc levels: Moderate degenerative joint changes are identified in the mid to lower cervical spine with narrowed joint space and osteophyte formation. Upper chest: Mild patchy hazy opacity of the right apex is identified, nonspecific. Other: None. IMPRESSION: 1. No acute intracranial abnormality identified. 2. No acute fracture or subluxation of the cervical spine. 3. Moderate degenerative joint changes of the mid to lower cervical spine. 4. Mild patchy hazy opacity of the right apex, nonspecific. Electronically Signed   By: Abelardo Diesel M.D.   On: 06/04/2021 12:47   CT Cervical Spine Wo Contrast  Result Date: 06/04/2021 CLINICAL DATA:  Head and neck trauma from November. Weakness and focal neuro deficit. EXAM: CT HEAD WITHOUT CONTRAST CT CERVICAL SPINE WITHOUT CONTRAST TECHNIQUE: Multidetector CT imaging of the head and cervical spine was performed following the standard protocol without intravenous contrast. Multiplanar CT image reconstructions of the cervical spine were also generated. RADIATION DOSE REDUCTION: This exam was performed according to the departmental dose-optimization program which includes automated exposure control, adjustment of the mA and/or kV according to patient  size  and/or use of iterative reconstruction technique. COMPARISON:  None. FINDINGS: CT HEAD FINDINGS Brain: No evidence of acute infarction, hemorrhage, hydrocephalus, extra-axial collection or mass lesion/mass effect. There is minimal chronic diffuse atrophy. Vascular: No hyperdense vessel is noted. Skull: Normal. Negative for fracture or focal lesion. Sinuses/Orbits: No acute finding. Other: None. CT CERVICAL SPINE FINDINGS Alignment: There is straightening of cervical spine either due to positioning or muscle spasm. Skull base and vertebrae: No acute fracture. No primary bone lesion or focal pathologic process. Soft tissues and spinal canal: No prevertebral fluid or swelling. No visible canal hematoma. Disc levels: Moderate degenerative joint changes are identified in the mid to lower cervical spine with narrowed joint space and osteophyte formation. Upper chest: Mild patchy hazy opacity of the right apex is identified, nonspecific. Other: None. IMPRESSION: 1. No acute intracranial abnormality identified. 2. No acute fracture or subluxation of the cervical spine. 3. Moderate degenerative joint changes of the mid to lower cervical spine. 4. Mild patchy hazy opacity of the right apex, nonspecific. Electronically Signed   By: Abelardo Diesel M.D.   On: 06/04/2021 12:47   DG Chest Portable 1 View  Result Date: 06/04/2021 CLINICAL DATA:  Weakness. EXAM: PORTABLE CHEST 1 VIEW COMPARISON:  July 26, 2019 FINDINGS: The heart size and mediastinal contours are within normal limits. Both lungs are clear. The visualized skeletal structures are unremarkable. IMPRESSION: No active disease. Electronically Signed   By: Abelardo Diesel M.D.   On: 06/04/2021 12:22    EKG: Independently reviewed.  Shows sinus rhythm  Assessment/Plan * Leg weakness, bilateral- (present on admission) - Patient gives a history of progressive lower extremity weakness over the last 2 months.  Prior to that she was ambulatory, was driving and able  to complete all her ADLs.  Neurology consulted, appreciate input, will likely need at least L-spine MRI but most likely higher than that as well.  She seems somewhat not concerned about her condition  Anxiety and depression- (present on admission) - Resume home medications once pharmacy reconciliation is complete.  Essential hypertension - Resume home medications once reconciliation done.  Currently normotensive  Hyperlipidemia- (present on admission) - Appears to be on statin, start once reconciliation done  Obesity, Class III, BMI 40-49.9 (morbid obesity) ([place]) - Patient would benefit from weight loss    DVT prophylaxis: Lovenox Code Status: Full code Family Communication: No family at bedside Disposition Plan: Will likely need SNF/CIR Bed Type: MedSurg Consults called: Neurology Obs/Inp: Obs   Marzetta Board, MD, PhD [facility]  Contact via [email or web address]  06/04/2021, 3:31 PM

## 2021-06-04 NOTE — Assessment & Plan Note (Addendum)
-   Continue home statin °

## 2021-06-04 NOTE — Telephone Encounter (Signed)
Please try # provided for recent virtual visit as pt stated she broke her phone

## 2021-06-04 NOTE — ED Notes (Signed)
Attempted report x 1-"bed not assigned"

## 2021-06-04 NOTE — ED Provider Notes (Signed)
[facility] EMERGENCY DEPARTMENT Provider Note   CSN: [id number] Arrival date & time: 06/04/21  1126     History  Chief Complaint  Patient presents with   Weakness    [name] is a 51 y.o. female.   Weakness Associated symptoms: no abdominal pain and no shortness of breath   Patient sent in for evaluation and potential rehab placement.  Has been generally weak since a fall and the end of November.  States that she fell and got her right arm caught in the Rollator she was using.  It was her mother's Rollator.  States she had sprained her knee earlier and that is why she was using it.  States she had some falls before that however.  Since then has been getting gradually weaker.  States she is no longer to get up and walk around.  Can only get in her lift chair.  No longer ambulatory.  States legs getting weaker.  States she feels that she has good strength in her arms but on exam is weaker on the right arm.  States that has been weak since the fall.  Denies hitting her head.  States she just slid down at the time.  States no back pain.  Saw her chiropractor after.  Has seen PCP and home health has been set up.  States she is does have a couple wounds on her rear end.   Past Medical History:  Diagnosis Date   Allergic rhinitis    Ankle fracture, right    Anxiety    ASCUS of cervix with negative high risk HPV 05/2018   Carpal tunnel syndrome    Cervical cancer ([place]) 1998   Fibromyalgia    Hyperlipidemia    Hypertension    Migraine headache    Obesity    Sleep apnea     Home Medications Prior to Admission medications   Medication Sig Start Date End Date Taking? Authorizing Provider  ALPRAZolam Duanne Moron) 0.5 MG tablet TAKE 1 TABLET BY MOUTH 2 TIMES DAILY AS NEEDED FOR ANXIETY 05/19/21   [name], MD  ARIPiprazole (ABILIFY) 10 MG tablet Take 10 mg by mouth in the morning.     [provider]  Armodafinil 250 MG tablet 3 (three) times daily.  8am, 2pm, 6pm 11/12/19   [provider]  Biotin 10 MG TABS Take 10 mg by mouth in the morning.  07/25/18   [provider]  buPROPion (WELLBUTRIN XL) 300 MG 24 hr tablet TAKE 1 TABLET BY MOUTH EVERY DAY 04/30/21   [name], MD  busPIRone (BUSPAR) 30 MG tablet TAKE 1 TABLET BY MOUTH 2 TIMES DAILY 05/19/21   [name], MD  calcium citrate (CALCITRATE - DOSED IN MG ELEMENTAL CALCIUM) 950 MG tablet Take 1 tablet by mouth daily.    [provider]  clobetasol cream (TEMOVATE) 1.44 % Apply 1 application topically 2 (two) times daily. Cover both legs with Colbetasol cream 08/02/19   Florencia Reasons, MD  clonazePAM (KLONOPIN) 1 MG tablet TAKE 1 TABLET BY MOUTH AT BEDTIME 05/20/21   [name], MD  collagenase (SANTYL) ointment Apply topically daily. apply Santyl to scabs on legs 08/03/19   Florencia Reasons, MD  cyclobenzaprine (FLEXERIL) 10 MG tablet TAKE 1 TABLET BY MOUTH 3 TIMES DAILY AS NEEDED 12/22/20   Dutch Quint B, FNP  doxycycline (VIBRA-TABS) 100 MG tablet Take 1 tablet (100 mg total) by mouth 2 (two) times daily. Patient not taking:  Reported on 05/31/2021 02/10/21   [name], MD  DULoxetine (CYMBALTA) 60 MG capsule Take 2 capsules (120 mg total) by mouth daily. 06/22/15   [name], MD  FEROSUL 325 (65 Fe) MG tablet TAKE 1 TABLET BY MOUTH EVERY DAY WITH BREAKFAST 04/30/21   [name], MD  fluconazole (DIFLUCAN) 100 MG tablet Take 1 tablet (100 mg total) by mouth daily. Patient not taking: Reported on 05/31/2021 07/10/20   [name], MD  furosemide (LASIX) 40 MG tablet TAKE 1 TABLET BY MOUTH EVERY DAY MAY take additional TABLET IF weight INCREASE by 5lb AND call MD FOR further instructions 05/19/21   [name], MD  gabapentin (NEURONTIN) 600 MG tablet Take 1 tablet (600 mg total) by mouth 3 (three) times daily. TAKE 2 TABLETS BY MOUTH 3 TIMES DAILY 07/09/20   [name], MD  HYDROcodone-acetaminophen [facility])  10-325 MG tablet TAKE 1 TABLET BY MOUTH EVERY 6 HOURS AS NEEDED 05/07/21   [name], MD  irbesartan (AVAPRO) 75 MG tablet TAKE 1 TABLET BY MOUTH EVERY DAY Patient not taking: Reported on 05/31/2021 12/23/19   [name], MD  meclizine (ANTIVERT) 25 MG tablet Take 1 tablet (25 mg total) by mouth 3 (three) times daily as needed for dizziness. 07/18/16   [name], MD  mupirocin ointment (BACTROBAN) 2 % APPLY TOPICALLY 2 TIMES DAILY 04/20/20   [name], MD  nebivolol (BYSTOLIC) 10 MG tablet TAKE 1 TABLET BY MOUTH EVERY DAY 04/30/21   [name], MD  nystatin (MYCOSTATIN/NYSTOP) powder Apply 1 application topically 3 (three) times daily. 03/17/20   [name], MD  Oxcarbazepine (TRILEPTAL) 300 MG tablet Take 300 mg by mouth 2 (two) times daily.    [provider]  pantoprazole (PROTONIX) 40 MG tablet TAKE 1 TABLET BY MOUTH 2 TIMES DAILY 04/30/21   [name], MD  pentoxifylline (TRENTAL) 400 MG CR tablet Take 400 mg by mouth in the morning and at bedtime. 03/22/17   [provider]  phentermine 37.5 MG capsule TAKE 1 CAPSULE BY MOUTH EVERY MORNING 05/19/21   [name], MD  promethazine (PHENERGAN) 25 MG tablet TAKE 1 TABLET BY MOUTH EVERY 6 HOURS AS NEEDED FOR NAUSEA AND VOMITING 09/07/20   [name], MD  rosuvastatin (CRESTOR) 20 MG tablet TAKE 1 TABLET BY MOUTH EVERY DAY 08/18/20   [name], MD  senna-docusate (SENOKOT-S) 8.6-50 MG tablet Take 1 tablet by mouth at bedtime. 08/02/19   Florencia Reasons, MD  SKYRIZI, 150 MG DOSE, 75 MG/0.83ML PSKT Inject 150 mg into the skin every 3 (three) months.  04/07/19   [provider]  SUMAtriptan (IMITREX) 100 MG tablet TAKE 1 TABLET BY MOUTH AT migraine ONSET, MAY REPEAT in 2 hours IF HEADACHE persists OR recurs 01/19/21   Dutch Quint B, FNP      Allergies    Niacin, Sulfamethoxazole-trimethoprim, Aspirin, Bactrim, Benzoin compound, Cephalexin,  Clindamycin/lincomycin, Iohexol, Lisinopril, Naproxen, Pnu-imune [pneumococcal polysaccharide vaccine], and Sulfonamide derivatives    Review of Systems   Review of Systems  Constitutional:  Negative for appetite change.  Respiratory:  Negative for shortness of breath.   Cardiovascular:  Positive for leg swelling.  Gastrointestinal:  Negative for abdominal pain.  Musculoskeletal:  Negative for back pain.  Skin:  Positive for wound.  Neurological:  Positive for weakness. Negative for numbness.  Psychiatric/Behavioral:  Negative for confusion.    Physical Exam Updated Vital Signs BP  106/67    Pulse 71    Temp 98.3 F (36.8 C) (Oral)    Resp (!) 24    Ht 5' 2.5" (1.588 m)    Wt 128 kg    SpO2 98%    BMI 50.79 kg/m  Physical Exam Vitals and nursing note reviewed.  Constitutional:      Appearance: She is obese.  HENT:     Head: Normocephalic.  Eyes:     Pupils: Pupils are equal, round, and reactive to light.  Cardiovascular:     Rate and Rhythm: Regular rhythm.  Pulmonary:     Breath sounds: No rhonchi.  Abdominal:     Tenderness: There is no abdominal tenderness.  Musculoskeletal:     Right lower leg: Edema present.     Left lower leg: Edema present.  Skin:    General: Skin is warm.  Neurological:     Mental Status: She is oriented to person, place, and time.     Comments: Good flexion extension at the ankles.  Sensation intact in both lower extremities.  Minimal straight leg raise.  Does have some clonus bilaterally.  Good raise of the shoulder and upper extremity strength on left side but decreased right abduction of the arm.    ED Results / Procedures / Treatments   Labs (all labs ordered are listed, but only abnormal results are displayed) Labs Reviewed  COMPREHENSIVE METABOLIC PANEL - Abnormal; Notable for the following components:      Result Value   Calcium 8.8 (*)    Albumin 2.8 (*)    AST 13 (*)    Total Bilirubin 0.2 (*)    All other components within normal  limits  RESP PANEL BY RT-PCR (FLU A&B, COVID) ARPGX2  CBC WITH DIFFERENTIAL/PLATELET  MAGNESIUM  URINALYSIS, ROUTINE W REFLEX MICROSCOPIC    EKG None  Radiology CT HEAD WO CONTRAST (5MM)  Result Date: 06/04/2021 CLINICAL DATA:  Head and neck trauma from November. Weakness and focal neuro deficit. EXAM: CT HEAD WITHOUT CONTRAST CT CERVICAL SPINE WITHOUT CONTRAST TECHNIQUE: Multidetector CT imaging of the head and cervical spine was performed following the standard protocol without intravenous contrast. Multiplanar CT image reconstructions of the cervical spine were also generated. RADIATION DOSE REDUCTION: This exam was performed according to the departmental dose-optimization program which includes automated exposure control, adjustment of the mA and/or kV according to patient size and/or use of iterative reconstruction technique. COMPARISON:  None. FINDINGS: CT HEAD FINDINGS Brain: No evidence of acute infarction, hemorrhage, hydrocephalus, extra-axial collection or mass lesion/mass effect. There is minimal chronic diffuse atrophy. Vascular: No hyperdense vessel is noted. Skull: Normal. Negative for fracture or focal lesion. Sinuses/Orbits: No acute finding. Other: None. CT CERVICAL SPINE FINDINGS Alignment: There is straightening of cervical spine either due to positioning or muscle spasm. Skull base and vertebrae: No acute fracture. No primary bone lesion or focal pathologic process. Soft tissues and spinal canal: No prevertebral fluid or swelling. No visible canal hematoma. Disc levels: Moderate degenerative joint changes are identified in the mid to lower cervical spine with narrowed joint space and osteophyte formation. Upper chest: Mild patchy hazy opacity of the right apex is identified, nonspecific. Other: None. IMPRESSION: 1. No acute intracranial abnormality identified. 2. No acute fracture or subluxation of the cervical spine. 3. Moderate degenerative joint changes of the mid to lower  cervical spine. 4. Mild patchy hazy opacity of the right apex, nonspecific. Electronically Signed   By: Abelardo Diesel M.D.   On:  06/04/2021 12:47   CT Cervical Spine Wo Contrast  Result Date: 06/04/2021 CLINICAL DATA:  Head and neck trauma from November. Weakness and focal neuro deficit. EXAM: CT HEAD WITHOUT CONTRAST CT CERVICAL SPINE WITHOUT CONTRAST TECHNIQUE: Multidetector CT imaging of the head and cervical spine was performed following the standard protocol without intravenous contrast. Multiplanar CT image reconstructions of the cervical spine were also generated. RADIATION DOSE REDUCTION: This exam was performed according to the departmental dose-optimization program which includes automated exposure control, adjustment of the mA and/or kV according to patient size and/or use of iterative reconstruction technique. COMPARISON:  None. FINDINGS: CT HEAD FINDINGS Brain: No evidence of acute infarction, hemorrhage, hydrocephalus, extra-axial collection or mass lesion/mass effect. There is minimal chronic diffuse atrophy. Vascular: No hyperdense vessel is noted. Skull: Normal. Negative for fracture or focal lesion. Sinuses/Orbits: No acute finding. Other: None. CT CERVICAL SPINE FINDINGS Alignment: There is straightening of cervical spine either due to positioning or muscle spasm. Skull base and vertebrae: No acute fracture. No primary bone lesion or focal pathologic process. Soft tissues and spinal canal: No prevertebral fluid or swelling. No visible canal hematoma. Disc levels: Moderate degenerative joint changes are identified in the mid to lower cervical spine with narrowed joint space and osteophyte formation. Upper chest: Mild patchy hazy opacity of the right apex is identified, nonspecific. Other: None. IMPRESSION: 1. No acute intracranial abnormality identified. 2. No acute fracture or subluxation of the cervical spine. 3. Moderate degenerative joint changes of the mid to lower cervical spine. 4. Mild  patchy hazy opacity of the right apex, nonspecific. Electronically Signed   By: Abelardo Diesel M.D.   On: 06/04/2021 12:47   DG Chest Portable 1 View  Result Date: 06/04/2021 CLINICAL DATA:  Weakness. EXAM: PORTABLE CHEST 1 VIEW COMPARISON:  July 26, 2019 FINDINGS: The heart size and mediastinal contours are within normal limits. Both lungs are clear. The visualized skeletal structures are unremarkable. IMPRESSION: No active disease. Electronically Signed   By: Abelardo Diesel M.D.   On: 06/04/2021 12:22    Procedures Procedures    Medications Ordered in ED Medications - No data to display  ED Course/ Medical Decision Making/ A&P                           Medical Decision Making Amount and/or Complexity of Data Reviewed External Data Reviewed: notes. Labs: ordered. Radiology: ordered and independent interpretation performed. Decision-making details documented in ED Course. ECG/medicine tests: independent interpretation performed. Decision-making details documented in ED Course.  Risk Decision regarding hospitalization.   Patient sent in for weakness.  Initial differential diagnosis for this as long includes life-threatening conditions of such stroke dehydration spinal injury. She has been feeling worse since Thanksgiving after a fall.  States she can no longer walk.  Gets around the house in  her lift.  PCP was looking to get her placed into [facility] rehab but sent in for further evaluation.  On exam does appear to have weakness of both lower extremities.  Does have clonus on both feet also.  Also right upper extremity weaker compared to left.  Has reportedly been this way for months and not a tPA candidate this was a stroke.  Head and cervical spine imaging done and reassuring for traumatic injury.  No stroke seen either.  However with worsening weakness, increased reflexes in lower extremities and some lateralizing symptoms I feels she would benefit from mission the hospital.  Will discuss  also with neurology.  Will discuss with hospitalist for  I have independently reviewed the chest x-ray and interpreted.  Lab work independently interpreted also.  Reassuring overall.        Final Clinical Impression(s) / ED Diagnoses Final diagnoses:  Weakness    Rx / DC Orders ED Discharge Orders     None         Davonna Belling, MD 06/04/21 1455

## 2021-06-04 NOTE — Assessment & Plan Note (Addendum)
-   Continue home medications 

## 2021-06-04 NOTE — Assessment & Plan Note (Addendum)
-  Body mass index is 50.79 kg/m. Patient has been advised to make an attempt to improve diet and exercise patterns to aid in weight loss.

## 2021-06-04 NOTE — Assessment & Plan Note (Deleted)
-   Patient gives a history of progressive lower extremity weakness over the last 2 months.  Prior to that she was ambulatory, was driving and able to complete all her ADLs.  Neurology consulted, appreciate input, will likely need at least L-spine MRI but most likely higher than that as well.  She seems somewhat not concerned about her condition

## 2021-06-04 NOTE — Consult Note (Signed)
Neurology Consultation  Reason for Consult: weakness. Referring Physician: N. Alvino Chapel, MD.   CC: weakness.   History is obtained from: patient, chart.   HPI: [name] is a 51 y.o. female with a PMHx of anxiety, cervical cancer, fibromyalgia, HLD, HTN, obesity, [name], neuropathy (denies DM), and MHA who presented to ED today with worsening weakness since last summer. She reports that last summer she was completely independent with shopping, bathing, cooking, ADLs, and driving. She went to a sporting event in Aug/Sept and was unable to propel her self up 3 small steps. Two men helped her to her car. She had a few falls after that, but never a head injury or injury that prompted her to seek emergency care. By Nov, she had sprained her knee and began to use a rollator. Thinking that her weakness was due to the sprain, she carried on the best she could. After that, she began to need help for ADLs. Since then, she has needed more and more help to the point of now being bed bound and unable to get up at all. She tried to get up a couple of times with her son's help, but slipped to the floor. She has been voiding and having stools in her bed. She uses bed pads but unable to use a bedpan. She says she knows when she needs to urinate and have a BM. She has no need to r/p voiding right after voiding. She saw her PCP on virtual visit not too long ago. Today, PCP suggested she come to ED and hopefully get into rehab.   Patient was covered in her own feces and urine when EMS arrived and reportedly she has a wound in perineal/buttock area.   She has no vision changes. No nasal speech. No jerking in extremities. No SOB. She has tingling in the tips of her fingers and intermittent numbness in her feet due to chronic neuropathy. No saddle anesthesia. Her legs are large and pinkish red near distal tibia and ankle bilaterally and states she has a hx of cellulitis. Her LLE is always "larger" than the RLE. She has a lot  of scaling tissue to the bottom of her feet, but no odor or discharge. Her leg weakness did not start at feet and ascent proximally. She knows she has "arthritis" in her back and apparently old fractures (T6, T12). Never had joint surgery or back surgery.   Per chart, she had a tele visit with PCP in January who recommended PT and DME. She missed her neurology appt due to wrong transportation sent to her home. She has another Neurology appt in Feb with Dr. Leta Baptist.    Neurology asked to consult for her gradual development of LE weakness.   ROS: A robust ROS was performed and is negative except as noted in the HPI.   Past Medical History:  Diagnosis Date   Allergic rhinitis    Ankle fracture, right    Anxiety    ASCUS of cervix with negative high risk HPV 05/2018   Carpal tunnel syndrome    Cervical cancer (HCC) 1998   Fibromyalgia    Hyperlipidemia    Hypertension    Migraine headache    Obesity    Sleep apnea    Family History  Problem Relation Age of Onset   Diabetes Mother    Emphysema Mother    Allergies Mother    Hypertension Mother    Heart disease Father    Allergies Father  Prostate cancer Father    Breast cancer Maternal Grandmother 16   Social History:   reports that she has never smoked. She has never used smokeless tobacco. She reports that she does not drink alcohol and does not use drugs.  Medications No current facility-administered medications for this encounter.  Current Outpatient Medications:    ALPRAZolam (XANAX) 0.5 MG tablet, TAKE 1 TABLET BY MOUTH 2 TIMES DAILY AS NEEDED FOR ANXIETY, Disp: 30 tablet, Rfl: 0   ARIPiprazole (ABILIFY) 10 MG tablet, Take 10 mg by mouth in the morning. , Disp: , Rfl:    Armodafinil 250 MG tablet, 3 (three) times daily. 8am, 2pm, 6pm, Disp: , Rfl:    Biotin 10 MG TABS, Take 10 mg by mouth in the morning. , Disp: , Rfl:    buPROPion (WELLBUTRIN XL) 300 MG 24 hr tablet, TAKE 1 TABLET BY MOUTH EVERY DAY, Disp: 30 tablet,  Rfl: 2   busPIRone (BUSPAR) 30 MG tablet, TAKE 1 TABLET BY MOUTH 2 TIMES DAILY, Disp: 60 tablet, Rfl: 2   calcium citrate (CALCITRATE - DOSED IN MG ELEMENTAL CALCIUM) 950 MG tablet, Take 1 tablet by mouth daily., Disp: , Rfl:    clobetasol cream (TEMOVATE) 4.08 %, Apply 1 application topically 2 (two) times daily. Cover both legs with Colbetasol cream, Disp: 30 g, Rfl: 0   clonazePAM (KLONOPIN) 1 MG tablet, TAKE 1 TABLET BY MOUTH AT BEDTIME, Disp: 30 tablet, Rfl: 3   collagenase (SANTYL) ointment, Apply topically daily. apply Santyl to scabs on legs, Disp: 15 g, Rfl: 0   cyclobenzaprine (FLEXERIL) 10 MG tablet, TAKE 1 TABLET BY MOUTH 3 TIMES DAILY AS NEEDED, Disp: 270 tablet, Rfl: 2   doxycycline (VIBRA-TABS) 100 MG tablet, Take 1 tablet (100 mg total) by mouth 2 (two) times daily. (Patient not taking: Reported on 05/31/2021), Disp: 14 tablet, Rfl: 0   DULoxetine (CYMBALTA) 60 MG capsule, Take 2 capsules (120 mg total) by mouth daily., Disp: 240 capsule, Rfl: 2   FEROSUL 325 (65 Fe) MG tablet, TAKE 1 TABLET BY MOUTH EVERY DAY WITH BREAKFAST, Disp: 30 tablet, Rfl: 2   fluconazole (DIFLUCAN) 100 MG tablet, Take 1 tablet (100 mg total) by mouth daily. (Patient not taking: Reported on 05/31/2021), Disp: 7 tablet, Rfl: 0   furosemide (LASIX) 40 MG tablet, TAKE 1 TABLET BY MOUTH EVERY DAY MAY take additional TABLET IF weight INCREASE by 5lb AND call MD FOR further instructions, Disp: 30 tablet, Rfl: 1   gabapentin (NEURONTIN) 600 MG tablet, Take 1 tablet (600 mg total) by mouth 3 (three) times daily. TAKE 2 TABLETS BY MOUTH 3 TIMES DAILY, Disp: 270 tablet, Rfl: 0   HYDROcodone-acetaminophen (NORCO) 10-325 MG tablet, TAKE 1 TABLET BY MOUTH EVERY 6 HOURS AS NEEDED, Disp: 90 tablet, Rfl: 0   irbesartan (AVAPRO) 75 MG tablet, TAKE 1 TABLET BY MOUTH EVERY DAY (Patient not taking: Reported on 05/31/2021), Disp: 30 tablet, Rfl: 0   meclizine (ANTIVERT) 25 MG tablet, Take 1 tablet (25 mg total) by mouth 3 (three)  times daily as needed for dizziness., Disp: 30 tablet, Rfl: 0   mupirocin ointment (BACTROBAN) 2 %, APPLY TOPICALLY 2 TIMES DAILY, Disp: 22 g, Rfl: 1   nebivolol (BYSTOLIC) 10 MG tablet, TAKE 1 TABLET BY MOUTH EVERY DAY, Disp: 30 tablet, Rfl: 2   nystatin (MYCOSTATIN/NYSTOP) powder, Apply 1 application topically 3 (three) times daily., Disp: 60 g, Rfl: 3   Oxcarbazepine (TRILEPTAL) 300 MG tablet, Take 300 mg by mouth 2 (two) times  daily., Disp: , Rfl:    pantoprazole (PROTONIX) 40 MG tablet, TAKE 1 TABLET BY MOUTH 2 TIMES DAILY, Disp: 60 tablet, Rfl: 2   pentoxifylline (TRENTAL) 400 MG CR tablet, Take 400 mg by mouth in the morning and at bedtime., Disp: , Rfl: 11   phentermine 37.5 MG capsule, TAKE 1 CAPSULE BY MOUTH EVERY MORNING, Disp: 90 capsule, Rfl: 0   promethazine (PHENERGAN) 25 MG tablet, TAKE 1 TABLET BY MOUTH EVERY 6 HOURS AS NEEDED FOR NAUSEA AND VOMITING, Disp: 45 tablet, Rfl: 3   rosuvastatin (CRESTOR) 20 MG tablet, TAKE 1 TABLET BY MOUTH EVERY DAY, Disp: 90 tablet, Rfl: 2   senna-docusate (SENOKOT-S) 8.6-50 MG tablet, Take 1 tablet by mouth at bedtime., Disp: 30 tablet, Rfl: 0   SKYRIZI, 150 MG DOSE, 75 MG/0.83ML PSKT, Inject 150 mg into the skin every 3 (three) months. , Disp: , Rfl:    SUMAtriptan (IMITREX) 100 MG tablet, TAKE 1 TABLET BY MOUTH AT migraine ONSET, MAY REPEAT in 2 hours IF HEADACHE persists OR recurs, Disp: 10 tablet, Rfl: 1  Exam: Current vital signs: BP 106/67    Pulse 71    Temp 98.3 F (36.8 C) (Oral)    Resp (!) 24    Ht 5' 2.5" (1.588 m)    Wt 128 kg    SpO2 98%    BMI 50.79 kg/m  Vital signs in last 24 hours: Temp:  [98.3 F (36.8 C)] 98.3 F (36.8 C) (01/20 1200) Pulse Rate:  [67-71] 71 (01/20 1400) Resp:  [11-24] 24 (01/20 1400) BP: (106-120)/(67-76) 106/67 (01/20 1400) SpO2:  [96 %-100 %] 98 % (01/20 1400) Weight:  [194 kg] 128 kg (01/20 1138)  PE: GENERAL: Chronically ill appearing, morbidly obese female. Awake, alert in NAD.  HEENT:  normocephalic and atraumatic. LUNGS - Normal respiratory effort.  CV - RRR on tele. ABDOMEN - Soft, nontender. Ext/Skin: General warmth. There is pink/red color to distal tibia/ankle area which does not extend to feet. This area is slightly warmer than other skin.  Psych: Affect appropriate to situation.   NEURO:  Mental Status: Alert and oriented x4. Follows commands. Able to say months of year backwards.  Speech/Language: speech is without dysarthria or aphasia. Not nasal.  Naming, repetition, fluency, and comprehension intact.  Cranial Nerves:  II: PERRL 5 mm/brisk. visual fields full.  III, IV, VI: EOMI. Lid elevation symmetric and full.  V: sensation is intact and symmetrical to face.  VII: Smile is symmetrical. Able to puff cheeks and raise eyebrows.  VIII:hearing intact to voice. IX, X: palate elevation is symmetric. Phonation normal.  XI: normal sternocleidomastoid and trapezius muscle strength. RDE:YCXKGY is symmetrical without fasciculations.   Motor:  RUE: grips  4/5       triceps 4/5     biceps  4/5      LUE: grips 5/5      triceps 5 /5      biceps   5/5 RLE: plantar flexion 4+ /5     dorsiflexion   4/5 LLE: plantar flexion  5 /5     dorsiflexion  5 /5 Tone is normal. Bulk is increased/  Sensation- Intact to light touch bilaterally in all four extremities. Extinction absent to DSS. Sharp intact and equal to face and all extremities. Unable to feel vibration in toes bilaterally, but intact elsewhere.  Coordination: FTN intact bilaterally. No pronator drift. + clonus LEs.  DTRs:  3+ throughout including pectoralis.  Gait- deferred.  Labs I have reviewed  labs in epic and the results pertinent to this consultation are:  CBC    Component Value Date/Time   WBC 5.7 06/04/2021 1202   RBC 4.64 06/04/2021 1202   HGB 13.0 06/04/2021 1202   HGB 13.9 11/03/2011 1036   HCT 42.5 06/04/2021 1202   HCT 42.8 11/03/2011 1036   PLT 278 06/04/2021 1202   PLT 271 11/03/2011 1036    MCV 91.6 06/04/2021 1202   MCV 89 11/03/2011 1036   MCH 28.0 06/04/2021 1202   MCHC 30.6 06/04/2021 1202   RDW 14.7 06/04/2021 1202   RDW 14.6 11/03/2011 1036   LYMPHSABS 1.3 06/04/2021 1202   LYMPHSABS 2.8 11/03/2011 1036   MONOABS 0.4 06/04/2021 1202   EOSABS 0.5 06/04/2021 1202   EOSABS 0.4 11/03/2011 1036   BASOSABS 0.0 06/04/2021 1202   BASOSABS 0.0 11/03/2011 1036   CMP     Component Value Date/Time   NA 135 06/04/2021 1202   NA 127 (A) 04/15/2019 0000   K 4.1 06/04/2021 1202   CL 103 06/04/2021 1202   CO2 25 06/04/2021 1202   GLUCOSE 90 06/04/2021 1202   BUN 8 06/04/2021 1202   BUN 7 04/15/2019 0000   CREATININE 0.50 06/04/2021 1202   CREATININE 0.86 02/27/2013 1519   CALCIUM 8.8 (L) 06/04/2021 1202   PROT 6.5 06/04/2021 1202   ALBUMIN 2.8 (L) 06/04/2021 1202   AST 13 (L) 06/04/2021 1202   ALT 13 06/04/2021 1202   ALKPHOS 106 06/04/2021 1202   BILITOT 0.2 (L) 06/04/2021 1202   GFRNONAA >60 06/04/2021 1202   GFRNONAA 83 02/27/2013 1519   GFRAA >60 08/02/2019 0209   GFRAA >89 02/27/2013 1519    Imaging MD reviewed the images obtained.  CT head and C spine.  -No acute intracranial abnormality identified. -No acute fracture or subluxation of the cervical spine. -Moderate degenerative joint changes of the mid to lower cervical spine. -Mild patchy hazy opacity of the right apex, nonspecific.    Assessment: 51 yo presenting with insidious weakness increasing over periods of month now causing patient to be bedbound. Her weakness is apparent on exam along with some sensory deficits. Doubt MS as she has no incontinence, vision changes, and she is outside normal age range for initial diagnosis. However, it is prudent to check imaging for demyelination. Doubt ALS because she does not have respiratory symptoms, nasal speech, or diaphragmatic weakness. Due to her weakness, we will check MRI Cspine and MRI brain. Debility is evident, and will need PT/OT for extended  period of time. Also, she may be Vitamin deficient, so we will do a Vitamin panel. Check HbA1c given neuropathy.   Impression: -Weakness, progressive over months.   Recommendations/Plan:  -MRI brain without contrast -MRI Cspine without contrast -Check Vitamin B12. Replete if less than 500.  -Check Vit D3, B6, E, folate, TSH, HbA1c.  -No steroids for now. This is chronic rather than acute. -PT/OT with recommendations for discharge. -wound care.  -? Cellulitis, defer to medicine.  -Likely will need EMG/NCS as an outpt.   Pt seen by Clance Boll, NP/Neuro and later by MD. Note/plan to be edited by MD as needed.  Pager: [phone]  NEUROHOSPITALIST ADDENDUM Performed a face to face diagnostic evaluation.   I have reviewed the contents of history and physical exam as documented by PA/ARNP/Resident and agree with above documentation.  I have discussed and formulated the above plan as documented. Edits to the note have been made as needed.  Impression: 81F presenting with  progressive all extremity weakness, worse in BL lower extremities compared to upper and proximal worse than distal. Key exam findings: 4/5 in BL uppers and 2-4/5 in BL lower extremities. She is worse in proximal musculature compared to distal. She has absent vibratory sensation in BL feet and is diffusely hyperreflexic. Plan: MRI Brain and C spine, CK + aldolase, Neuropathy labs, will benefit from EMG with NCS.  Donnetta Simpers, MD [facility] [phone]   If 7pm to 7am, please call on call as listed on AMION.

## 2021-06-04 NOTE — ED Notes (Signed)
Attempted report x 2. Phone goes to [employer]

## 2021-06-04 NOTE — Telephone Encounter (Signed)
FYI: I tried to call her twice already. It goes straight to vm, I have left a message both times. I did also send her a my chart message in hopes of reaching her.

## 2021-06-04 NOTE — ED Triage Notes (Signed)
Pt BIB [facility] for eval of weakness. Pt has been progressively weaker since a fall from her rollator in November. Pt reports son has been changing her pads. Foul odor noted from perineum area, EMS reports possibility of multiple bedsores and was covered in own feces and urine. Pt reports her PCP wants her admitted to inpt rehab [facility]

## 2021-06-04 NOTE — Telephone Encounter (Signed)
Spoke to patient and she stated that she was about to call the ambulance to transport her in about 15 min.

## 2021-06-05 DIAGNOSIS — E538 Deficiency of other specified B group vitamins: Secondary | ICD-10-CM | POA: Diagnosis not present

## 2021-06-05 DIAGNOSIS — E785 Hyperlipidemia, unspecified: Secondary | ICD-10-CM | POA: Diagnosis not present

## 2021-06-05 DIAGNOSIS — I10 Essential (primary) hypertension: Secondary | ICD-10-CM | POA: Diagnosis not present

## 2021-06-05 DIAGNOSIS — G9589 Other specified diseases of spinal cord: Secondary | ICD-10-CM | POA: Diagnosis not present

## 2021-06-05 DIAGNOSIS — E559 Vitamin D deficiency, unspecified: Secondary | ICD-10-CM

## 2021-06-05 LAB — HEMOGLOBIN A1C
Hgb A1c MFr Bld: 5.1 % (ref 4.8–5.6)
Mean Plasma Glucose: 100 mg/dL

## 2021-06-05 LAB — CK: Total CK: 14 U/L — ABNORMAL LOW (ref 38–234)

## 2021-06-05 LAB — FOLATE: Folate: 3.7 ng/mL — ABNORMAL LOW (ref >5.9)

## 2021-06-05 LAB — HIV ANTIBODY (ROUTINE TESTING W REFLEX): HIV Screen 4th Generation wRfx: NONREACTIVE

## 2021-06-05 MED ORDER — FOLIC ACID 1 MG PO TABS
1.0000 mg | ORAL_TABLET | Freq: Every day | ORAL | Status: DC
  Administered 2021-06-06 – 2021-06-10 (×5): 1 mg via ORAL
  Filled 2021-06-05 (×5): qty 1

## 2021-06-05 MED ORDER — SODIUM CHLORIDE 0.9 % IV SOLN
1.0000 mg | Freq: Once | INTRAVENOUS | Status: AC
  Administered 2021-06-05: 1 mg via INTRAVENOUS
  Filled 2021-06-05: qty 0.2

## 2021-06-05 MED ORDER — CYANOCOBALAMIN 1000 MCG/ML IJ SOLN
1000.0000 ug | Freq: Every day | INTRAMUSCULAR | Status: AC
  Administered 2021-06-05 – 2021-06-09 (×5): 1000 ug via INTRAMUSCULAR
  Filled 2021-06-05 (×5): qty 1

## 2021-06-05 MED ORDER — HYDROCODONE-ACETAMINOPHEN 5-325 MG PO TABS
1.0000 | ORAL_TABLET | Freq: Once | ORAL | Status: AC | PRN
  Administered 2021-06-05: 1 via ORAL
  Filled 2021-06-05: qty 1

## 2021-06-05 MED ORDER — THIAMINE HCL 100 MG PO TABS
100.0000 mg | ORAL_TABLET | Freq: Every day | ORAL | Status: DC
  Administered 2021-06-05 – 2021-06-10 (×6): 100 mg via ORAL
  Filled 2021-06-05 (×6): qty 1

## 2021-06-05 MED ORDER — VITAMIN D (ERGOCALCIFEROL) 1.25 MG (50000 UNIT) PO CAPS
50000.0000 [IU] | ORAL_CAPSULE | Freq: Every day | ORAL | Status: AC
  Administered 2021-06-05 – 2021-06-09 (×5): 50000 [IU] via ORAL
  Filled 2021-06-05 (×5): qty 1

## 2021-06-05 NOTE — Evaluation (Signed)
Occupational Therapy Evaluation Patient Details Name: [name] MRN: [id number] DOB: 08-27-1970 Today's Date: 06/05/2021   History of Present Illness 51 y/o female presented to ED on 06/04/21 for evaluation of worsening bilateral LE weakness over past 6 months after falls in Aug/Sept of 2022. MRI showed moderate to severe spinal stenosis C3-7 and suspected subtle patchy cord signal abnormality at level of C3-4, suspicious of compressive myelomalacia. Waiting for neurosurgical consult. PMH: obesity, HTN, sleep apnea   Clinical Impression   Pt admitted for concerns listed above. PTA pt reported that she has been "bed bound" staying in her lift chair since Thanksgiving. Pt reports she has not been able to stand and requires max assist for most ADL's. At this time, pt requiring min A +2 for bed mobility, to support trunk and BLE, however once attempting to come to standing, pt was unable to power up to standing. She required total A +2 to clear her bottom a few inches off the bed surface. Recommending SNF level therapies at this time, due to increased weakness and balance deficits. OT will continue to follow acutely.       Recommendations for follow up therapy are one component of a multi-disciplinary discharge planning process, led by the attending physician.  Recommendations may be updated based on patient status, additional functional criteria and insurance authorization.   Follow Up Recommendations  Skilled nursing-short term rehab (<3 hours/day)    Assistance Recommended at Discharge Frequent or constant Supervision/Assistance  Patient can return home with the following Two people to help with walking and/or transfers;Two people to help with bathing/dressing/bathroom;Assistance with cooking/housework;Direct supervision/assist for medications management    Functional Status Assessment  Patient has had a recent decline in their functional status and demonstrates the ability to make significant  improvements in function in a reasonable and predictable amount of time.  Equipment Recommendations  Other (comment) (TBD)    Recommendations for Other Services       Precautions / Restrictions Precautions Precautions: Fall Restrictions Weight Bearing Restrictions: No      Mobility Bed Mobility Overal bed mobility: Needs Assistance Bed Mobility: Rolling, Sidelying to Sit, Sit to Sidelying Rolling: Min assist Sidelying to sit: Min assist, +2 for physical assistance, +2 for safety/equipment     Sit to sidelying: Min assist, +2 for physical assistance, +2 for safety/equipment General bed mobility comments: Pt requirin gmin A with trunk and BLE to take out of bed and back into bed.    Transfers Overall transfer level: Needs assistance Equipment used: 2 person hand held assist Transfers: Sit to/from Stand Sit to Stand: Total assist, +2 physical assistance, +2 safety/equipment           General transfer comment: Using Gait belt and pad underneath pt, pt did not appear to provide effort to stand, bottom raised 3-4 inches from bed surface before returning to sit.      Balance Overall balance assessment: Needs assistance Sitting-balance support: Single extremity supported, Feet supported Sitting balance-Leahy Scale: Fair     Standing balance support: Bilateral upper extremity supported Standing balance-Leahy Scale: Zero Standing balance comment: Pt unable to come to standing                           ADL either performed or assessed with clinical judgement   ADL Overall ADL's : Needs assistance/impaired Eating/Feeding: Set up;Sitting   Grooming: Set up;Sitting   Upper Body Bathing: Minimal assistance;Sitting   Lower Body Bathing: Maximal  assistance;+2 for physical assistance;+2 for safety/equipment;Bed level   Upper Body Dressing : Min guard;Sitting   Lower Body Dressing: Maximal assistance;+2 for physical assistance;+2 for safety/equipment;Bed level        Toileting- Clothing Manipulation and Hygiene: Total assistance;+2 for physical assistance;+2 for safety/equipment;Bed level         General ADL Comments: Pt unable to stand at this time, requirin gmax-total A +2 for all LB ADL's at bed level due to difficulty with standing     Vision Baseline Vision/History: 0 No visual deficits Ability to See in Adequate Light: 0 Adequate Patient Visual Report: No change from baseline Vision Assessment?: No apparent visual deficits     Perception     Praxis      Pertinent Vitals/Pain Pain Assessment Pain Assessment: No/denies pain     Hand Dominance Right   Extremity/Trunk Assessment Upper Extremity Assessment Upper Extremity Assessment: Generalized weakness   Lower Extremity Assessment Lower Extremity Assessment: Defer to PT evaluation   Cervical / Trunk Assessment Cervical / Trunk Assessment: Kyphotic   Communication Communication Communication: No difficulties   Cognition Arousal/Alertness: Awake/alert Behavior During Therapy: WFL for tasks assessed/performed Overall Cognitive Status: No family/caregiver present to determine baseline cognitive functioning                                 General Comments: Pt O&Ax4, pleasant and cooperative, aware of deficits. Pt reporting she can not stand at all and provide no assist to stand, however individually pt has strength in BLE and BUE     General Comments  VSS on RA    Exercises     Shoulder Instructions      Home Living Family/patient expects to be discharged to:: Private residence Living Arrangements: Children;Parent Available Help at Discharge: Family Type of Home: House Home Access: Level entry     Home Layout: Two level;Able to live on main level with bedroom/bathroom     Bathroom Shower/Tub: Walk-in shower;Tub/shower unit   Bathroom Toilet: Standard     Home Equipment: Rollator (4 wheels);Shower seat          Prior  Functioning/Environment Prior Level of Function : Needs assist             Mobility Comments: has been living in lift chair for past month or two ADLs Comments: patient uses bed pads while son is at work. Son changes bed pad and performs pericare when he returns home from work        OT Problem List: Decreased strength;Decreased activity tolerance;Impaired balance (sitting and/or standing);Decreased safety awareness;Decreased knowledge of use of DME or AE;Pain      OT Treatment/Interventions: Self-care/ADL training;Therapeutic exercise;Energy conservation;DME and/or AE instruction;Therapeutic activities;Patient/family education;Balance training    OT Goals(Current goals can be found in the care plan section) Acute Rehab OT Goals Patient Stated Goal: To go home OT Goal Formulation: With patient Time For Goal Achievement: 06/19/21 Potential to Achieve Goals: Good ADL Goals Pt Will Perform Upper Body Bathing: with modified independence;sitting Pt Will Perform Lower Body Bathing: with mod assist;with adaptive equipment;sitting/lateral leans Pt Will Perform Upper Body Dressing: with modified independence;sitting Pt Will Perform Lower Body Dressing: with mod assist;with adaptive equipment;sitting/lateral leans Pt Will Transfer to Toilet: with mod assist;with +2 assist;squat pivot transfer Pt Will Perform Toileting - Clothing Manipulation and hygiene: with mod assist;sitting/lateral leans;with adaptive equipment  OT Frequency: Min 2X/week    Co-evaluation  AM-PAC OT "6 Clicks" Daily Activity     Outcome Measure Help from another person eating meals?: A Little Help from another person taking care of personal grooming?: A Little Help from another person toileting, which includes using toliet, bedpan, or urinal?: Total Help from another person bathing (including washing, rinsing, drying)?: A Lot Help from another person to put on and taking off regular upper body  clothing?: A Little Help from another person to put on and taking off regular lower body clothing?: Total 6 Click Score: 13   End of Session Equipment Utilized During Treatment: Gait belt Nurse Communication: Mobility status  Activity Tolerance: Patient tolerated treatment well Patient left: in bed;with call bell/phone within reach;with bed alarm set  OT Visit Diagnosis: Unsteadiness on feet (R26.81);Other abnormalities of gait and mobility (R26.89);Muscle weakness (generalized) (M62.81)                Time: 9753-0051 OT Time Calculation (min): 30 min Charges:  OT General Charges $OT Visit: 1 Visit OT Evaluation $OT Eval Moderate Complexity: 1 Mod  Jessilyn Catino H., OTR/L Acute Rehabilitation  Mckenlee Mangham Elane Yolanda Bonine 06/05/2021, 12:28 PM

## 2021-06-05 NOTE — Hospital Course (Signed)
51 y.o. female with medical history significant of obesity, class III, depression/anxiety, hypertension, hyperlipidemia, comes into the hospital with progressive lower extremity weakness.  Patient noted that she has had increased weakness in her legs since last Thanksgiving.  Neurology consulted.  MRI of the C-spine concerning for compressive myelomalacia and neurosurgery consulted.

## 2021-06-05 NOTE — Assessment & Plan Note (Signed)
-  Replete folate levels

## 2021-06-05 NOTE — Consult Note (Signed)
Reason for Consult:cervical stenosis, gait difficulties Referring Physician: Laverna Peace  [name] is an 51 y.o. female.  HPI: who was sent to the ED at the behest of her physician to seek admission for inpatient rehabilitation. Though not a  very reliable   Past Medical History:  Diagnosis Date   Allergic rhinitis    Ankle fracture, right    Anxiety    ASCUS of cervix with negative high risk HPV 05/2018   Carpal tunnel syndrome    Cervical cancer ([place]) 1998   Fibromyalgia    Hyperlipidemia    Hypertension    Migraine headache    Obesity    Sleep apnea     Past Surgical History:  Procedure Laterality Date   ABDOMINAL HYSTERECTOMY  10/1996   ABDOMINAL SURGERY  jan/11/2012   gastric bypass surgery   CARPAL TUNNEL RELEASE     bilateral    CESAREAN SECTION     CHOLECYSTECTOMY  07/1992   ESOPHAGOGASTRODUODENOSCOPY N/A 01/14/2016   Procedure: ESOPHAGOGASTRODUODENOSCOPY (EGD);  Surgeon: Mauri Pole, MD;  Location: [facility] ENDOSCOPY;  Service: Endoscopy;  Laterality: N/A;    Family History  Problem Relation Age of Onset   Diabetes Mother    Emphysema Mother    Allergies Mother    Hypertension Mother    Heart disease Father    Allergies Father    Prostate cancer Father    Breast cancer Maternal Grandmother 41    Social History:  reports that she has never smoked. She has never used smokeless tobacco. She reports that she does not drink alcohol and does not use drugs.  Allergies:  Allergies  Allergen Reactions   Niacin Anaphylaxis, Shortness Of Breath and Swelling    Swelling and "problems breathing"   Sulfamethoxazole-Trimethoprim Anaphylaxis, Swelling, Rash and Other (See Comments)    Throat closed and the eyes became swollen   Aspirin Hives   Bactrim Hives   Benzoin Compound Rash   Cephalexin Hives    Tolerated ceftriaxone and cefepime 07/2019   Clindamycin/Lincomycin Hives   Iohexol Hives    Pt treated with PO benedryl   Lisinopril Hives   Naproxen Hives    Pnu-Imune [Pneumococcal Polysaccharide Vaccine] Rash   Sulfonamide Derivatives Rash    Medications: I have reviewed the patient's current medications.  Results for orders placed or performed during the hospital encounter of 06/04/21 (from the past 48 hour(s))  CBC with Differential     Status: None   Collection Time: 06/04/21 12:02 PM  Result Value Ref Range   WBC 5.7 4.0 - 10.5 K/uL   RBC 4.64 3.87 - 5.11 MIL/uL   Hemoglobin 13.0 12.0 - 15.0 g/dL   HCT 42.5 36.0 - 46.0 %   MCV 91.6 80.0 - 100.0 fL   MCH 28.0 26.0 - 34.0 pg   MCHC 30.6 30.0 - 36.0 g/dL   RDW 14.7 11.5 - 15.5 %   Platelets 278 150 - 400 K/uL   nRBC 0.0 0.0 - 0.2 %   Neutrophils Relative % 60 %   Neutro Abs 3.4 1.7 - 7.7 K/uL   Lymphocytes Relative 24 %   Lymphs Abs 1.3 0.7 - 4.0 K/uL   Monocytes Relative 7 %   Monocytes Absolute 0.4 0.1 - 1.0 K/uL   Eosinophils Relative 8 %   Eosinophils Absolute 0.5 0.0 - 0.5 K/uL   Basophils Relative 1 %   Basophils Absolute 0.0 0.0 - 0.1 K/uL   Immature Granulocytes 0 %   Abs Immature  Granulocytes 0.01 0.00 - 0.07 K/uL    Comment: Performed at [facility], [place] [address], [place], [place] [address]  Comprehensive metabolic panel     Status: Abnormal   Collection Time: 06/04/21 12:02 PM  Result Value Ref Range   Sodium 135 135 - 145 mmol/L   Potassium 4.1 3.5 - 5.1 mmol/L   Chloride 103 98 - 111 mmol/L   CO2 25 22 - 32 mmol/L   Glucose, Bld 90 70 - 99 mg/dL    Comment: Glucose reference range applies only to samples taken after fasting for at least 8 hours.   BUN 8 6 - 20 mg/dL   Creatinine, Ser 0.50 0.44 - 1.00 mg/dL   Calcium 8.8 (L) 8.9 - 10.3 mg/dL   Total Protein 6.5 6.5 - 8.1 g/dL   Albumin 2.8 (L) 3.5 - 5.0 g/dL   AST 13 (L) 15 - 41 U/L   ALT 13 0 - 44 U/L   Alkaline Phosphatase 106 38 - 126 U/L   Total Bilirubin 0.2 (L) 0.3 - 1.2 mg/dL   GFR, Estimated >60 >60 mL/min    Comment: (NOTE) Calculated using the CKD-EPI Creatinine Equation (2021)     Anion gap 7 5 - 15    Comment: Performed at [facility], [place] [address] [place], [place] [address]  Magnesium     Status: None   Collection Time: 06/04/21 12:02 PM  Result Value Ref Range   Magnesium 1.8 1.7 - 2.4 mg/dL    Comment: Performed at [facility], [place] [address], [place], [place] [address]  Resp Panel by RT-PCR (Flu A&B, Covid) Nasopharyngeal Swab     Status: None   Collection Time: 06/04/21 12:08 PM   Specimen: Nasopharyngeal Swab; Nasopharyngeal(NP) swabs in vial transport medium  Result Value Ref Range   SARS Coronavirus 2 by RT PCR NEGATIVE NEGATIVE    Comment: (NOTE) SARS-CoV-2 target nucleic acids are NOT DETECTED.  The SARS-CoV-2 RNA is generally detectable in upper respiratory specimens during the acute phase of infection. The lowest concentration of SARS-CoV-2 viral copies this assay can detect is 138 copies/mL. A negative result does not preclude SARS-Cov-2 infection and should not be used as the sole basis for treatment or other patient management decisions. A negative result may occur with  improper specimen collection/handling, submission of specimen other than nasopharyngeal swab, presence of viral mutation(s) within the areas targeted by this assay, and inadequate number of viral copies(<138 copies/mL). A negative result must be combined with clinical observations, patient history, and epidemiological information. The expected result is Negative.  Fact Sheet for Patients:  EntrepreneurPulse.com.au  Fact Sheet for Healthcare Providers:  IncredibleEmployment.be  This test is no t yet approved or cleared by the [place] FDA and  has been authorized for detection and/or diagnosis of SARS-CoV-2 by FDA under an Emergency Use Authorization (EUA). This EUA will remain  in effect (meaning this test can be used) for the duration of the COVID-19 declaration under Section 564(b)(1) of the Act, 21 U.S.C.section  360bbb-3(b)(1), unless the authorization is terminated  or revoked sooner.       Influenza A by PCR NEGATIVE NEGATIVE   Influenza B by PCR NEGATIVE NEGATIVE    Comment: (NOTE) The Xpert Xpress SARS-CoV-2/FLU/RSV plus assay is intended as an aid in the diagnosis of influenza from Nasopharyngeal swab specimens and should not be used as a sole basis for treatment. Nasal washings and aspirates are unacceptable for Xpert Xpress SARS-CoV-2/FLU/RSV  testing.  Fact Sheet for Patients: EntrepreneurPulse.com.au  Fact Sheet for Healthcare Providers: IncredibleEmployment.be  This test is not yet approved or cleared by the [place] FDA and has been authorized for detection and/or diagnosis of SARS-CoV-2 by FDA under an Emergency Use Authorization (EUA). This EUA will remain in effect (meaning this test can be used) for the duration of the COVID-19 declaration under Section 564(b)(1) of the Act, 21 U.S.C. section 360bbb-3(b)(1), unless the authorization is terminated or revoked.  Performed at [facility], [place] [address], [place], [place] [address]   Vitamin B12     Status: None   Collection Time: 06/04/21  5:39 PM  Result Value Ref Range   Vitamin B-12 264 180 - 914 pg/mL    Comment: (NOTE) This assay is not validated for testing neonatal or myeloproliferative syndrome specimens for Vitamin B12 levels. Performed at [facility], [place] [address] [place], [place] [address]   TSH     Status: None   Collection Time: 06/04/21  5:39 PM  Result Value Ref Range   TSH 2.100 0.350 - 4.500 uIU/mL    Comment: Performed by a 3rd Generation assay with a functional sensitivity of <=0.01 uIU/mL. Performed at [facility], [place] [address], [place], [place] [address]   Hemoglobin A1c     Status: None   Collection Time: 06/04/21  5:39 PM  Result Value Ref Range   Hgb A1c MFr Bld 5.1 4.8 - 5.6 %    Comment: (NOTE)         Prediabetes: 5.7 -  6.4         Diabetes: >6.4         Glycemic control for adults with diabetes: <7.0    Mean Plasma Glucose 100 mg/dL    Comment: (NOTE) Performed At: [facility] [place], [place] [id number] Rush Farmer MD UX:[id number]   VITAMIN D 25 Hydroxy (Vit-D Deficiency, Fractures)     Status: Abnormal   Collection Time: 06/04/21  5:39 PM  Result Value Ref Range   Vit D, 25-Hydroxy 29.91 (L) 30 - 100 ng/mL    Comment: (NOTE) Vitamin D deficiency has been defined by the [place] practice guideline as a level of serum 25-OH  vitamin D less than 20 ng/mL (1,2). The Endocrine Society went on to  further define vitamin D insufficiency as a level between 21 and 29  ng/mL (2).  1. IOM (Institute of Medicine). 2010. Dietary reference intakes for  calcium and D. [place]: The [employer]. 2. Holick MF, Binkley [place], Bischoff-Ferrari HA, et al. Evaluation,  treatment, and prevention of vitamin D deficiency: an Endocrine  Society clinical practice guideline, JCEM. 2011 Jul; 96(7): 1911-30.  Performed at [facility], [place] [address], [place], [place] [address]   HIV Antibody (routine testing w rflx)     Status: None   Collection Time: 06/05/21  2:02 AM  Result Value Ref Range   HIV Screen 4th Generation wRfx Non Reactive Non Reactive    Comment: Performed at [facility], [place] [address], [place], [place] [address]  CK     Status: Abnormal   Collection Time: 06/05/21  2:02 AM  Result Value Ref Range   Total CK 14 (L) 38 - 234 U/L    Comment: Performed at [facility], [place] [address] [place], [place] [address]  Folate     Status:  Abnormal   Collection Time: 06/05/21  2:02 AM  Result Value Ref Range   Folate 3.7 (L) >5.9 ng/mL    Comment: Performed at [facility], [place] [address] [place], [place] [address]   *Note: Due to a large number of results and/or encounters for the requested time  period, some results have not been displayed. A complete set of results can be found in Results Review.    CT HEAD WO CONTRAST (5MM)  Result Date: 06/04/2021 CLINICAL DATA:  Head and neck trauma from November. Weakness and focal neuro deficit. EXAM: CT HEAD WITHOUT CONTRAST CT CERVICAL SPINE WITHOUT CONTRAST TECHNIQUE: Multidetector CT imaging of the head and cervical spine was performed following the standard protocol without intravenous contrast. Multiplanar CT image reconstructions of the cervical spine were also generated. RADIATION DOSE REDUCTION: This exam was performed according to the departmental dose-optimization program which includes automated exposure control, adjustment of the mA and/or kV according to patient size and/or use of iterative reconstruction technique. COMPARISON:  None. FINDINGS: CT HEAD FINDINGS Brain: No evidence of acute infarction, hemorrhage, hydrocephalus, extra-axial collection or mass lesion/mass effect. There is minimal chronic diffuse atrophy. Vascular: No hyperdense vessel is noted. Skull: Normal. Negative for fracture or focal lesion. Sinuses/Orbits: No acute finding. Other: None. CT CERVICAL SPINE FINDINGS Alignment: There is straightening of cervical spine either due to positioning or muscle spasm. Skull base and vertebrae: No acute fracture. No primary bone lesion or focal pathologic process. Soft tissues and spinal canal: No prevertebral fluid or swelling. No visible canal hematoma. Disc levels: Moderate degenerative joint changes are identified in the mid to lower cervical spine with narrowed joint space and osteophyte formation. Upper chest: Mild patchy hazy opacity of the right apex is identified, nonspecific. Other: None. IMPRESSION: 1. No acute intracranial abnormality identified. 2. No acute fracture or subluxation of the cervical spine. 3. Moderate degenerative joint changes of the mid to lower cervical spine. 4. Mild patchy hazy opacity of the right apex,  nonspecific. Electronically Signed   By: Abelardo Diesel M.D.   On: 06/04/2021 12:47   CT Cervical Spine Wo Contrast  Result Date: 06/04/2021 CLINICAL DATA:  Head and neck trauma from November. Weakness and focal neuro deficit. EXAM: CT HEAD WITHOUT CONTRAST CT CERVICAL SPINE WITHOUT CONTRAST TECHNIQUE: Multidetector CT imaging of the head and cervical spine was performed following the standard protocol without intravenous contrast. Multiplanar CT image reconstructions of the cervical spine were also generated. RADIATION DOSE REDUCTION: This exam was performed according to the departmental dose-optimization program which includes automated exposure control, adjustment of the mA and/or kV according to patient size and/or use of iterative reconstruction technique. COMPARISON:  None. FINDINGS: CT HEAD FINDINGS Brain: No evidence of acute infarction, hemorrhage, hydrocephalus, extra-axial collection or mass lesion/mass effect. There is minimal chronic diffuse atrophy. Vascular: No hyperdense vessel is noted. Skull: Normal. Negative for fracture or focal lesion. Sinuses/Orbits: No acute finding. Other: None. CT CERVICAL SPINE FINDINGS Alignment: There is straightening of cervical spine either due to positioning or muscle spasm. Skull base and vertebrae: No acute fracture. No primary bone lesion or focal pathologic process. Soft tissues and spinal canal: No prevertebral fluid or swelling. No visible canal hematoma. Disc levels: Moderate degenerative joint changes are identified in the mid to lower cervical spine with narrowed joint space and osteophyte formation. Upper chest: Mild patchy hazy opacity of the right apex is identified, nonspecific. Other: None. IMPRESSION: 1. No acute intracranial abnormality identified. 2. No acute fracture or  subluxation of the cervical spine. 3. Moderate degenerative joint changes of the mid to lower cervical spine. 4. Mild patchy hazy opacity of the right apex, nonspecific.  Electronically Signed   By: Abelardo Diesel M.D.   On: 06/04/2021 12:47   MR BRAIN WO CONTRAST  Result Date: 06/05/2021 CLINICAL DATA:  Initial evaluation for generalized weakness. EXAM: MRI HEAD WITHOUT CONTRAST TECHNIQUE: Multiplanar, multiecho pulse sequences of the brain and surrounding structures were obtained without intravenous contrast. COMPARISON:  Prior CT from earlier the same day. FINDINGS: Brain: Cerebral volume within normal limits for age. No focal parenchymal signal abnormality or significant cerebral white matter disease. No evidence for acute or subacute infarct. Gray-white matter differentiation maintained. No encephalomalacia suggest chronic cortical infarction. No acute or chronic intracranial blood products. No mass lesion, midline shift or mass effect. No hydrocephalus or extra-axial fluid collection. Pituitary gland suprasellar region normal. Midline structures intact and normal. Vascular: Major intracranial vascular flow voids are maintained. Skull and upper cervical spine: Craniocervical junction within normal limits. Bone marrow signal intensity diffusely decreased on T1 weighted sequence, nonspecific, but most commonly related to anemia, smoking or obesity. No focal marrow replacing lesion. No scalp soft tissue abnormality. Sinuses/Orbits: Globes and orbital soft tissues within normal limits. Paranasal sinuses are largely clear. No mastoid effusion. Other: None. IMPRESSION: Normal brain MRI for age.  No acute intracranial abnormality. Electronically Signed   By: Jeannine Boga M.D.   On: 06/05/2021 00:21   MR CERVICAL SPINE W WO CONTRAST  Result Date: 06/05/2021 CLINICAL DATA:  Initial evaluation for neck pain, weakness. EXAM: MRI CERVICAL SPINE WITHOUT AND WITH CONTRAST TECHNIQUE: Multiplanar and multiecho pulse sequences of the cervical spine, to include the craniocervical junction and cervicothoracic junction, were obtained without and with intravenous contrast. CONTRAST:   37mL GADAVIST GADOBUTROL 1 MMOL/ML IV SOLN COMPARISON:  Comparison made with prior CT from earlier the same day. FINDINGS: Alignment: Examination somewhat degraded by motion artifact. Straightening of the normal cervical lordosis. No listhesis or static subluxation. Vertebrae: Vertebral body height maintained without acute or chronic fracture. Bone marrow signal intensity mildly decreased on T1 weighted imaging, nonspecific, but most commonly related to anemia, smoking, or obesity. No discrete or worrisome osseous lesions. Mild reactive marrow edema present about the right C5-6 facet due to facet arthritis (series 11, image 2). No other abnormal marrow edema. Cord: Suspected subtle patchy cord signal abnormality at the level of C3-4, suspicious for compressive myelomalacia (series 11, image 7). No other convincing cord signal changes on this motion degraded exam. No abnormal enhancement. Posterior Fossa, vertebral arteries, paraspinal tissues: Craniocervical junction within normal limits. Localized edema within the posterior paraspinous soft tissues adjacent to the inflamed right C5-6 facet. Paraspinous soft tissues demonstrate no other acute finding. Normal flow voids seen within the vertebral arteries bilaterally. Disc levels: C2-C3: Negative interspace. Mild bilateral facet hypertrophy. No significant canal or foraminal stenosis. C3-C4: Diffuse disc bulge with endplate and uncovertebral spurring. Superimposed lobulated central to left paracentral disc protrusion flattens and effaces the ventral thecal sac (series 13, image 13). Protruding disc impinges upon the cervical spinal cord which is markedly flattened. Suspected subtle cord signal changes as above, most concerning for myelomalacia. Resultant severe spinal stenosis with near complete effacement of the thecal sac on the left. Severe left worse than right C4 foraminal stenosis. C4-C5: Degenerative intervertebral disc space narrowing with diffuse bulge with  endplate and uncovertebral spurring. Central to right paracentral disc osteophyte complex indents the ventral thecal sac (series 13, image  18). Secondary flattening and compression of the right hemi cord without definite cord signal changes. Severe spinal stenosis, worse on the right. Severe right with moderate left C5 foraminal stenosis. C5-C6: Degenerative intervertebral disc space narrowing with diffuse disc osteophyte complex. Superimposed broad right paracentral component indents and largely effaces the ventral thecal sac (series 13, image 23). Secondary cord flattening without definite cord signal changes. Severe right-sided spinal stenosis with the thecal sac measuring 3 mm in AP diameter. Moderate right worse than left C5 foraminal stenosis. C6-C7: Degenerative intervertebral disc space narrowing with diffuse disc osteophyte complex. Broad left paracentral component indents and effaces the ventral thecal sac (series 13, image 27). Secondary flattening of the left hemi cord without cord signal changes. Moderate to severe spinal stenosis. Moderate right worse than left C7 foraminal narrowing. C7-T1: Right paracentral disc extrusion indents the right ventral thecal sac (series 12, image 31). Associated slight superior migration (series 9, image 7). Mild right-sided spinal stenosis. Superimposed mild facet hypertrophy. Foramina remain patent. Visualized upper thoracic spine demonstrates no significant finding. IMPRESSION: 1. Advanced for age cervical spondylosis with resultant moderate to severe diffuse spinal stenosis at C3-4 through C6-7. Associated moderate to severe C4 through C7 foraminal stenosis as above. 2. Suspected subtle patchy cord signal abnormality at the level of C3-4, suspicious for compressive myelomalacia. Neuro surgical consultation recommended. 3. Mild reactive marrow edema about the right C5-6 facet due to facet arthritis. Finding could serve as a source for neck pain. These results were  communicated to Dr. Leonel Ramsay at 12:35 am on 06/05/2021 by text page via the [facility] messaging system. Electronically Signed   By: Jeannine Boga M.D.   On: 06/05/2021 00:40   DG Chest Portable 1 View  Result Date: 06/04/2021 CLINICAL DATA:  Weakness. EXAM: PORTABLE CHEST 1 VIEW COMPARISON:  July 26, 2019 FINDINGS: The heart size and mediastinal contours are within normal limits. Both lungs are clear. The visualized skeletal structures are unremarkable. IMPRESSION: No active disease. Electronically Signed   By: Abelardo Diesel M.D.   On: 06/04/2021 12:22    Review of Systems  Constitutional:  Positive for activity change.  HENT: Negative.    Eyes: Negative.   Blood pressure 111/60, pulse 70, temperature 98.5 F (36.9 C), temperature source Oral, resp. rate 18, height 5' 2.5" (1.588 m), weight 128 kg, SpO2 94 %. Physical Exam Constitutional:      General: She is not in acute distress.    Appearance: Normal appearance. She is obese.  HENT:     Head: Normocephalic and atraumatic.     Right Ear: External ear normal.     Left Ear: External ear normal.     Nose: Nose normal.     Mouth/Throat:     Mouth: Mucous membranes are moist.     Pharynx: Oropharynx is clear.  Eyes:     Extraocular Movements: Extraocular movements intact.     Conjunctiva/sclera: Conjunctivae normal.     Pupils: Pupils are equal, round, and reactive to light.  Cardiovascular:     Rate and Rhythm: Normal rate and regular rhythm.  Pulmonary:     Effort: Pulmonary effort is normal.  Abdominal:     Palpations: Abdomen is soft.  Musculoskeletal:        General: Normal range of motion.     Cervical back: Normal range of motion.  Skin:    General: Skin is warm and dry.  Neurological:     Mental Status: She is alert and oriented to person, place,  and time.     Cranial Nerves: Cranial nerves 2-12 are intact.     Sensory: Sensory deficit present.     Motor: Motor function is intact.     Coordination: Heel to Shin  Test normal.     Comments: Decreased light touch bilateral feet Normal coordination upper extremities Did not assess gait Proprioception intact in the lower extremities, to the level of the first digit bilaterally    Assessment/Plan: ADYLENE DLUGOSZ is a 51 y.o. female With a history of progressive weakness. While I do believe she will need cervical decompression, she has eaten today, and given the strength she displays on my exam which at worst is 4/5 in the lower extremities I am surprised by report that she is unable to bear weight. I will recommend a 3 level ACDF at C3-7. This is not needed as an emergency. I find her strength at the feet to be 5/5. Hip flexors also 5/5, hamstrings 5/5.  I may be getting more effort than previous examiners. She has 2-3 beats of clonus right and left sides.  Will follow [name] 06/05/2021, 12:16 PM

## 2021-06-05 NOTE — Assessment & Plan Note (Addendum)
-  Patient presented to the hospital with significant progressive lower extremity weakness to the point that she could not walk. - MRI of the C-spine showed moderate to severe diffuse spinal stenosis C3-4 through C6-7, also C3-4 cord signal abnormalities suspicious for compressive myelomalacia.  Neurosurgery was consulted.  Per neurosurgery, she will eventually need 3 level ACDF at C3-7, but not urgent.  Recommended outpatient follow up with Dr Christella Noa.   -PT consult was obtained.  SNF was recommended but patient made a choice to go home.  DME arrangements made.

## 2021-06-05 NOTE — Progress Notes (Signed)
PROGRESS NOTE  [name] JXB:[id number] DOB: 1971-04-26 DOA: 06/04/2021 PCP: [name], MD   LOS: 0 days   Brief Narrative / Interim history:  51 y.o. female with medical history significant of obesity, class III, depression/anxiety, hypertension, hyperlipidemia, comes into the hospital with progressive lower extremity weakness.  Patient noted that she has had increased weakness in her legs since last Thanksgiving.  Neurology consulted.  MRI of the C-spine concerning for compressive myelomalacia and neurosurgery consulted.  Subjective / 24h Interval events: Continues to complains of weakness in her legs.  Assessment & Plan: Compressive cervical cord myelomalacia ([place]) -Patient presented to the hospital with significant progressive lower extremity weakness to the point that she cannot walk.  MRI of the C-spine showed moderate to severe diffuse spinal stenosis C3-4 through C6-7, also C3-4 cord signal abnormalities suspicious for is compressive myelomalacia.  Neurosurgery consulted, discussed with Dr. Christella Noa, appreciate input.  Anxiety and depression- (present on admission) - Continue home medications  Essential hypertension - Continue home medications  Hyperlipidemia- (present on admission) - Continue home statin  B12 deficiency - B12 on the lower side of normal, given neurodeficits replete IV.  She used to be on IV B12, but stopped because she was told levels normalized  Vitamin D deficiency -Continue to replete  Folate deficiency -Replete folate levels  Obesity, Class III, BMI 40-49.9 (morbid obesity) ([place]) - Patient would benefit from weight loss    Scheduled Meds:  ARIPiprazole  10 mg Oral q AM   buPROPion  300 mg Oral Daily   busPIRone  30 mg Oral BID   cyanocobalamin  1,000 mcg Intramuscular Daily   DULoxetine  60 mg Oral BID   enoxaparin (LOVENOX) injection  0.5 mg/kg Subcutaneous Q24H   ferrous sulfate  325 mg Oral Q breakfast   [START ON 06/15/8655]  folic acid  1 mg Oral Daily   furosemide  40 mg Oral Daily   gabapentin  1,200 mg Oral QHS   gabapentin  600 mg Oral BID   nebivolol  10 mg Oral Daily   Oxcarbazepine  300 mg Oral BID   pantoprazole  40 mg Oral BID AC   pentoxifylline  400 mg Oral TID WC   rosuvastatin  20 mg Oral Daily   thiamine  100 mg Oral Daily   Vitamin D (Ergocalciferol)  50,000 Units Oral Daily   Continuous Infusions: PRN Meds:.acetaminophen **OR** acetaminophen, ALPRAZolam  Diet Orders (From admission, onward)     Start     Ordered   06/04/21 1822  Diet regular Room service appropriate? Yes; Fluid consistency: Thin  Diet effective now       Question Answer Comment  Room service appropriate? Yes   Fluid consistency: Thin      06/04/21 1821            DVT prophylaxis:    Lab Results  Component Value Date   PLT 278 06/04/2021      Code Status: Full Code  Family Communication: No family at bedside  Status is: Observation  The patient will require care spanning > 2 midnights and should be moved to inpatient because: Persistent weakness, PT eval pending, likely needs rehab, neurosurgical consultation pending  Level of care: Med-Surg  Consultants:  Neurology Neurosurgery  Procedures:  none  Microbiology  none  Antimicrobials: none    Objective: Vitals:   06/04/21 1946 06/04/21 2328 06/05/21 0339 06/05/21 0759  BP: 114/63 109/69 132/67 111/60  Pulse: 69 71 74 70  Resp:  18 18 17 18   Temp: 98 F (36.7 C) 98.3 F (36.8 C) 98.5 F (36.9 C) 98.5 F (36.9 C)  TempSrc: Oral Oral Oral Oral  SpO2: 98% 94% 98% 94%  Weight:      Height:       No intake or output data in the 24 hours ending 06/05/21 1104 Wt Readings from Last 3 Encounters:  06/04/21 128 kg  04/27/21 127.9 kg  02/10/21 (!) 141.3 kg    Examination:  Constitutional: NAD Eyes: no scleral icterus ENMT: Mucous membranes are moist.  Neck: normal, supple Respiratory: clear to auscultation bilaterally, no  wheezing, no crackles.  Cardiovascular: Regular rate and rhythm, no murmurs / rubs / gallops. No LE edema. Good peripheral pulses Abdomen: non distended, no tenderness. Bowel sounds positive.  Musculoskeletal: no clubbing / cyanosis.  Skin: no rashes Neurologic: Bilateral lower extremity weakness mainly in the proximal muscle groups, mild upper extremity weakness in the proximal muscles as well   Data Reviewed: I have independently reviewed following labs and imaging studies  CBC Recent Labs  Lab 06/04/21 1202  WBC 5.7  HGB 13.0  HCT 42.5  PLT 278  MCV 91.6  MCH 28.0  MCHC 30.6  RDW 14.7  LYMPHSABS 1.3  MONOABS 0.4  EOSABS 0.5  BASOSABS 0.0    Recent Labs  Lab 06/04/21 1202 06/04/21 1739  NA 135  --   K 4.1  --   CL 103  --   CO2 25  --   GLUCOSE 90  --   BUN 8  --   CREATININE 0.50  --   CALCIUM 8.8*  --   AST 13*  --   ALT 13  --   ALKPHOS 106  --   BILITOT 0.2*  --   ALBUMIN 2.8*  --   MG 1.8  --   TSH  --  2.100  HGBA1C  --  5.1   ------------------------------------------------------------------------------------------------------------------ No results for input(s): CHOL, HDL, LDLCALC, TRIG, CHOLHDL, LDLDIRECT in the last 72 hours.  Lab Results  Component Value Date   HGBA1C 5.1 06/04/2021   ------------------------------------------------------------------------------------------------------------------ Recent Labs    06/04/21 1739  TSH 2.100    Cardiac Enzymes No results for input(s): CKMB, TROPONINI, MYOGLOBIN in the last 168 hours.  Invalid input(s): CK ------------------------------------------------------------------------------------------------------------------ No results found for: BNP  CBG: No results for input(s): GLUCAP in the last 168 hours.  Recent Results (from the past 240 hour(s))  Resp Panel by RT-PCR (Flu A&B, Covid) Nasopharyngeal Swab     Status: None   Collection Time: 06/04/21 12:08 PM   Specimen: Nasopharyngeal  Swab; Nasopharyngeal(NP) swabs in vial transport medium  Result Value Ref Range Status   SARS Coronavirus 2 by RT PCR NEGATIVE NEGATIVE Final    Comment: (NOTE) SARS-CoV-2 target nucleic acids are NOT DETECTED.  The SARS-CoV-2 RNA is generally detectable in upper respiratory specimens during the acute phase of infection. The lowest concentration of SARS-CoV-2 viral copies this assay can detect is 138 copies/mL. A negative result does not preclude SARS-Cov-2 infection and should not be used as the sole basis for treatment or other patient management decisions. A negative result may occur with  improper specimen collection/handling, submission of specimen other than nasopharyngeal swab, presence of viral mutation(s) within the areas targeted by this assay, and inadequate number of viral copies(<138 copies/mL). A negative result must be combined with clinical observations, patient history, and epidemiological information. The expected result is Negative.  Fact Sheet for Patients:  EntrepreneurPulse.com.au  Fact Sheet for Healthcare Providers:  IncredibleEmployment.be  This test is no t yet approved or cleared by the [place] FDA and  has been authorized for detection and/or diagnosis of SARS-CoV-2 by FDA under an Emergency Use Authorization (EUA). This EUA will remain  in effect (meaning this test can be used) for the duration of the COVID-19 declaration under Section 564(b)(1) of the Act, 21 U.S.C.section 360bbb-3(b)(1), unless the authorization is terminated  or revoked sooner.       Influenza A by PCR NEGATIVE NEGATIVE Final   Influenza B by PCR NEGATIVE NEGATIVE Final    Comment: (NOTE) The Xpert Xpress SARS-CoV-2/FLU/RSV plus assay is intended as an aid in the diagnosis of influenza from Nasopharyngeal swab specimens and should not be used as a sole basis for treatment. Nasal washings and aspirates are unacceptable for Xpert Xpress  SARS-CoV-2/FLU/RSV testing.  Fact Sheet for Patients: EntrepreneurPulse.com.au  Fact Sheet for Healthcare Providers: IncredibleEmployment.be  This test is not yet approved or cleared by the [place] FDA and has been authorized for detection and/or diagnosis of SARS-CoV-2 by FDA under an Emergency Use Authorization (EUA). This EUA will remain in effect (meaning this test can be used) for the duration of the COVID-19 declaration under Section 564(b)(1) of the Act, 21 U.S.C. section 360bbb-3(b)(1), unless the authorization is terminated or revoked.  Performed at [facility], [place] [address], [place], [place] [address]      Radiology Studies: CT HEAD WO CONTRAST (5MM)  Result Date: 06/04/2021 CLINICAL DATA:  Head and neck trauma from November. Weakness and focal neuro deficit. EXAM: CT HEAD WITHOUT CONTRAST CT CERVICAL SPINE WITHOUT CONTRAST TECHNIQUE: Multidetector CT imaging of the head and cervical spine was performed following the standard protocol without intravenous contrast. Multiplanar CT image reconstructions of the cervical spine were also generated. RADIATION DOSE REDUCTION: This exam was performed according to the departmental dose-optimization program which includes automated exposure control, adjustment of the mA and/or kV according to patient size and/or use of iterative reconstruction technique. COMPARISON:  None. FINDINGS: CT HEAD FINDINGS Brain: No evidence of acute infarction, hemorrhage, hydrocephalus, extra-axial collection or mass lesion/mass effect. There is minimal chronic diffuse atrophy. Vascular: No hyperdense vessel is noted. Skull: Normal. Negative for fracture or focal lesion. Sinuses/Orbits: No acute finding. Other: None. CT CERVICAL SPINE FINDINGS Alignment: There is straightening of cervical spine either due to positioning or muscle spasm. Skull base and vertebrae: No acute fracture. No primary bone lesion or focal  pathologic process. Soft tissues and spinal canal: No prevertebral fluid or swelling. No visible canal hematoma. Disc levels: Moderate degenerative joint changes are identified in the mid to lower cervical spine with narrowed joint space and osteophyte formation. Upper chest: Mild patchy hazy opacity of the right apex is identified, nonspecific. Other: None. IMPRESSION: 1. No acute intracranial abnormality identified. 2. No acute fracture or subluxation of the cervical spine. 3. Moderate degenerative joint changes of the mid to lower cervical spine. 4. Mild patchy hazy opacity of the right apex, nonspecific. Electronically Signed   By: Abelardo Diesel M.D.   On: 06/04/2021 12:47   CT Cervical Spine Wo Contrast  Result Date: 06/04/2021 CLINICAL DATA:  Head and neck trauma from November. Weakness and focal neuro deficit. EXAM: CT HEAD WITHOUT CONTRAST CT CERVICAL SPINE WITHOUT CONTRAST TECHNIQUE: Multidetector CT imaging of the head and cervical spine was performed following the standard protocol without intravenous contrast. Multiplanar CT image reconstructions of the cervical spine were also generated. RADIATION DOSE  REDUCTION: This exam was performed according to the departmental dose-optimization program which includes automated exposure control, adjustment of the mA and/or kV according to patient size and/or use of iterative reconstruction technique. COMPARISON:  None. FINDINGS: CT HEAD FINDINGS Brain: No evidence of acute infarction, hemorrhage, hydrocephalus, extra-axial collection or mass lesion/mass effect. There is minimal chronic diffuse atrophy. Vascular: No hyperdense vessel is noted. Skull: Normal. Negative for fracture or focal lesion. Sinuses/Orbits: No acute finding. Other: None. CT CERVICAL SPINE FINDINGS Alignment: There is straightening of cervical spine either due to positioning or muscle spasm. Skull base and vertebrae: No acute fracture. No primary bone lesion or focal pathologic process. Soft  tissues and spinal canal: No prevertebral fluid or swelling. No visible canal hematoma. Disc levels: Moderate degenerative joint changes are identified in the mid to lower cervical spine with narrowed joint space and osteophyte formation. Upper chest: Mild patchy hazy opacity of the right apex is identified, nonspecific. Other: None. IMPRESSION: 1. No acute intracranial abnormality identified. 2. No acute fracture or subluxation of the cervical spine. 3. Moderate degenerative joint changes of the mid to lower cervical spine. 4. Mild patchy hazy opacity of the right apex, nonspecific. Electronically Signed   By: Abelardo Diesel M.D.   On: 06/04/2021 12:47   MR BRAIN WO CONTRAST  Result Date: 06/05/2021 CLINICAL DATA:  Initial evaluation for generalized weakness. EXAM: MRI HEAD WITHOUT CONTRAST TECHNIQUE: Multiplanar, multiecho pulse sequences of the brain and surrounding structures were obtained without intravenous contrast. COMPARISON:  Prior CT from earlier the same day. FINDINGS: Brain: Cerebral volume within normal limits for age. No focal parenchymal signal abnormality or significant cerebral white matter disease. No evidence for acute or subacute infarct. Gray-white matter differentiation maintained. No encephalomalacia suggest chronic cortical infarction. No acute or chronic intracranial blood products. No mass lesion, midline shift or mass effect. No hydrocephalus or extra-axial fluid collection. Pituitary gland suprasellar region normal. Midline structures intact and normal. Vascular: Major intracranial vascular flow voids are maintained. Skull and upper cervical spine: Craniocervical junction within normal limits. Bone marrow signal intensity diffusely decreased on T1 weighted sequence, nonspecific, but most commonly related to anemia, smoking or obesity. No focal marrow replacing lesion. No scalp soft tissue abnormality. Sinuses/Orbits: Globes and orbital soft tissues within normal limits. Paranasal  sinuses are largely clear. No mastoid effusion. Other: None. IMPRESSION: Normal brain MRI for age.  No acute intracranial abnormality. Electronically Signed   By: Jeannine Boga M.D.   On: 06/05/2021 00:21   MR CERVICAL SPINE W WO CONTRAST  Result Date: 06/05/2021 CLINICAL DATA:  Initial evaluation for neck pain, weakness. EXAM: MRI CERVICAL SPINE WITHOUT AND WITH CONTRAST TECHNIQUE: Multiplanar and multiecho pulse sequences of the cervical spine, to include the craniocervical junction and cervicothoracic junction, were obtained without and with intravenous contrast. CONTRAST:  16mL GADAVIST GADOBUTROL 1 MMOL/ML IV SOLN COMPARISON:  Comparison made with prior CT from earlier the same day. FINDINGS: Alignment: Examination somewhat degraded by motion artifact. Straightening of the normal cervical lordosis. No listhesis or static subluxation. Vertebrae: Vertebral body height maintained without acute or chronic fracture. Bone marrow signal intensity mildly decreased on T1 weighted imaging, nonspecific, but most commonly related to anemia, smoking, or obesity. No discrete or worrisome osseous lesions. Mild reactive marrow edema present about the right C5-6 facet due to facet arthritis (series 11, image 2). No other abnormal marrow edema. Cord: Suspected subtle patchy cord signal abnormality at the level of C3-4, suspicious for compressive myelomalacia (series 11, image 7). No  other convincing cord signal changes on this motion degraded exam. No abnormal enhancement. Posterior Fossa, vertebral arteries, paraspinal tissues: Craniocervical junction within normal limits. Localized edema within the posterior paraspinous soft tissues adjacent to the inflamed right C5-6 facet. Paraspinous soft tissues demonstrate no other acute finding. Normal flow voids seen within the vertebral arteries bilaterally. Disc levels: C2-C3: Negative interspace. Mild bilateral facet hypertrophy. No significant canal or foraminal stenosis.  C3-C4: Diffuse disc bulge with endplate and uncovertebral spurring. Superimposed lobulated central to left paracentral disc protrusion flattens and effaces the ventral thecal sac (series 13, image 13). Protruding disc impinges upon the cervical spinal cord which is markedly flattened. Suspected subtle cord signal changes as above, most concerning for myelomalacia. Resultant severe spinal stenosis with near complete effacement of the thecal sac on the left. Severe left worse than right C4 foraminal stenosis. C4-C5: Degenerative intervertebral disc space narrowing with diffuse bulge with endplate and uncovertebral spurring. Central to right paracentral disc osteophyte complex indents the ventral thecal sac (series 13, image 18). Secondary flattening and compression of the right hemi cord without definite cord signal changes. Severe spinal stenosis, worse on the right. Severe right with moderate left C5 foraminal stenosis. C5-C6: Degenerative intervertebral disc space narrowing with diffuse disc osteophyte complex. Superimposed broad right paracentral component indents and largely effaces the ventral thecal sac (series 13, image 23). Secondary cord flattening without definite cord signal changes. Severe right-sided spinal stenosis with the thecal sac measuring 3 mm in AP diameter. Moderate right worse than left C5 foraminal stenosis. C6-C7: Degenerative intervertebral disc space narrowing with diffuse disc osteophyte complex. Broad left paracentral component indents and effaces the ventral thecal sac (series 13, image 27). Secondary flattening of the left hemi cord without cord signal changes. Moderate to severe spinal stenosis. Moderate right worse than left C7 foraminal narrowing. C7-T1: Right paracentral disc extrusion indents the right ventral thecal sac (series 12, image 31). Associated slight superior migration (series 9, image 7). Mild right-sided spinal stenosis. Superimposed mild facet hypertrophy. Foramina  remain patent. Visualized upper thoracic spine demonstrates no significant finding. IMPRESSION: 1. Advanced for age cervical spondylosis with resultant moderate to severe diffuse spinal stenosis at C3-4 through C6-7. Associated moderate to severe C4 through C7 foraminal stenosis as above. 2. Suspected subtle patchy cord signal abnormality at the level of C3-4, suspicious for compressive myelomalacia. Neuro surgical consultation recommended. 3. Mild reactive marrow edema about the right C5-6 facet due to facet arthritis. Finding could serve as a source for neck pain. These results were communicated to Dr. Leonel Ramsay at 12:35 am on 06/05/2021 by text page via the [facility] messaging system. Electronically Signed   By: Jeannine Boga M.D.   On: 06/05/2021 00:40   DG Chest Portable 1 View  Result Date: 06/04/2021 CLINICAL DATA:  Weakness. EXAM: PORTABLE CHEST 1 VIEW COMPARISON:  July 26, 2019 FINDINGS: The heart size and mediastinal contours are within normal limits. Both lungs are clear. The visualized skeletal structures are unremarkable. IMPRESSION: No active disease. Electronically Signed   By: Abelardo Diesel M.D.   On: 06/04/2021 12:22     Marzetta Board, MD, PhD [facility]  Between 7 am - 7 pm I am available, please contact me via Amion (for emergencies) or Securechat (non urgent messages)  Between 7 pm - 7 am I am not available, please contact night coverage MD/APP via Amion

## 2021-06-05 NOTE — Assessment & Plan Note (Addendum)
-   B12 on the lower side of normal, given neurodeficits replete IV.  She used to be on IV B12, but stopped because she was told levels normalized.  Continue repletion

## 2021-06-05 NOTE — Progress Notes (Addendum)
Brief Neuro Update:  CK is normal, unlikely to be myopathy/myositis. MRI C spine concerning for compressive myelopathy. Low normal B12 and low folate.  Weakness is likely multifactorial from compressive myelopathy and micronutrient deficiency. Agree with IV B12 and folate replacement followed by PO. I also added PO Thiamine.  Recommend Neurosurgical evaluation.  She should follow up with Narda Amber with [facility].  We will signoff. Please feel free to contact us with any questions or concerns.  [place] Pager Number [phone]

## 2021-06-05 NOTE — Evaluation (Signed)
Physical Therapy Evaluation Patient Details Name: [name] MRN: [id number] DOB: Sep 30, 1970 Today's Date: 06/05/2021  History of Present Illness  51 y/o female presented to ED on 06/04/21 for evaluation of worsening bilateral LE weakness over past 6 months after falls in Aug/Sept of 2022. MRI showed moderate to severe spinal stenosis C3-7 and suspected subtle patchy cord signal abnormality at level of C3-4, suspicious of compressive myelomalacia. Waiting for neurosurgical consult. PMH: obesity, HTN, sleep apnea  Clinical Impression  Patient admitted with above diagnosis. Patient presents with generalized weakness, decreased activity tolerance, impaired balance, and concern for impaired cognition. Patient reports living in lift chair for past 2 months and has not been standing or walking in that time frame. Patient required minA+2 for bed mobility but with attempt to stand, patient required totalA+2 due to poor effort given by patient. Patient will benefit from skilled PT services during acute stay to address listed deficits. Recommend SNF at discharge to maximize functional mobility and safety.        Recommendations for follow up therapy are one component of a multi-disciplinary discharge planning process, led by the attending physician.  Recommendations may be updated based on patient status, additional functional criteria and insurance authorization.  Follow Up Recommendations Skilled nursing-short term rehab (<3 hours/day)    Assistance Recommended at Discharge Frequent or constant Supervision/Assistance  Patient can return home with the following       Equipment Recommendations Hospital bed;Wheelchair cushion (measurements PT);Wheelchair (measurements PT);Other (comment) (hoyer lift)  Recommendations for Other Services       Functional Status Assessment Patient has had a recent decline in their functional status and/or demonstrates limited ability to make significant improvements in  function in a reasonable and predictable amount of time     Precautions / Restrictions Precautions Precautions: Fall Restrictions Weight Bearing Restrictions: No      Mobility  Bed Mobility Overal bed mobility: Needs Assistance Bed Mobility: Rolling, Sidelying to Sit, Sit to Sidelying Rolling: Min assist Sidelying to sit: Min assist, +2 for physical assistance, +2 for safety/equipment     Sit to sidelying: Min assist, +2 for physical assistance, +2 for safety/equipment General bed mobility comments: Pt requiring min A with trunk and BLE to take out of bed and back into bed.    Transfers Overall transfer level: Needs assistance Equipment used: 2 person hand held assist Transfers: Sit to/from Stand Sit to Stand: Total assist, +2 physical assistance, +2 safety/equipment           General transfer comment: Using Gait belt and pad underneath pt, pt did not appear to provide effort to stand, bottom raised 3-4 inches from bed surface before returning to sit.    Ambulation/Gait               General Gait Details: unable  Stairs            Wheelchair Mobility    Modified Rankin (Stroke Patients Only)       Balance Overall balance assessment: Needs assistance Sitting-balance support: Single extremity supported, Feet supported Sitting balance-Leahy Scale: Fair     Standing balance support: Bilateral upper extremity supported Standing balance-Leahy Scale: Zero Standing balance comment: Pt unable to come to standing                             Pertinent Vitals/Pain Pain Assessment Pain Assessment: No/denies pain    Home Living Family/patient expects to be discharged to:: Private  residence Living Arrangements: Children;Parent Available Help at Discharge: Family Type of Home: House Home Access: Level entry       [place]: Two level;Able to live on main level with bedroom/bathroom Home Equipment: Rollator (4 wheels);Shower seat       Prior Function Prior Level of Function : Needs assist             Mobility Comments: has been living in lift chair for past month or two. No ambulation/standing in past 2-3 months ADLs Comments: patient uses bed pads while son is at work. Son changes bed pad and performs pericare when he returns home from work     Hand Dominance   Dominant Hand: Right    Extremity/Trunk Assessment   Upper Extremity Assessment Upper Extremity Assessment: Defer to OT evaluation    Lower Extremity Assessment Lower Extremity Assessment: Generalized weakness (grossly 2+/5 but limited by body habitus. Decreased sensation L LE > R)    Cervical / Trunk Assessment Cervical / Trunk Assessment: Kyphotic (large body habitus)  Communication   Communication: No difficulties  Cognition Arousal/Alertness: Awake/alert Behavior During Therapy: WFL for tasks assessed/performed Overall Cognitive Status: No family/caregiver present to determine baseline cognitive functioning                                 General Comments: Pt O&Ax4, pleasant and cooperative, aware of deficits. Pt reporting she can not stand at all and provide no assist to stand, however individually pt has strength in BLE and BUE        General Comments General comments (skin integrity, edema, etc.): VSS on RA    Exercises     Assessment/Plan    PT Assessment Patient needs continued PT services  PT Problem List Decreased range of motion;Decreased strength;Decreased activity tolerance;Decreased mobility;Decreased balance;Decreased coordination;Decreased cognition;Decreased knowledge of use of DME;Decreased knowledge of precautions;Decreased safety awareness;Impaired sensation       PT Treatment Interventions DME instruction;Gait training;Functional mobility training;Therapeutic exercise;Therapeutic activities;Balance training;Neuromuscular re-education;Patient/family education;Wheelchair mobility training    PT Goals  (Current goals can be found in the Care Plan section)  Acute Rehab PT Goals Patient Stated Goal: to go to rehab PT Goal Formulation: With patient Time For Goal Achievement: 06/19/21 Potential to Achieve Goals: Fair    Frequency Min 3X/week     Co-evaluation               AM-PAC PT "6 Clicks" Mobility  Outcome Measure Help needed turning from your back to your side while in a flat bed without using bedrails?: Total Help needed moving from lying on your back to sitting on the side of a flat bed without using bedrails?: Total Help needed moving to and from a bed to a chair (including a wheelchair)?: Total Help needed standing up from a chair using your arms (e.g., wheelchair or bedside chair)?: Total Help needed to walk in hospital room?: Total Help needed climbing 3-5 steps with a railing? : Total 6 Click Score: 6    End of Session Equipment Utilized During Treatment: Gait belt Activity Tolerance: Patient tolerated treatment well Patient left: in bed;with call bell/phone within reach;with bed alarm set Nurse Communication: Mobility status PT Visit Diagnosis: Unsteadiness on feet (R26.81);Muscle weakness (generalized) (M62.81);Difficulty in walking, not elsewhere classified (R26.2)    Time: 6295-2841 PT Time Calculation (min) (ACUTE ONLY): 30 min   Charges:   PT Evaluation $PT Eval Moderate Complexity: 1 Mod  Neola Worrall A. Gilford Rile PT, DPT Acute Rehabilitation Services Pager [phone] Office [phone]   Linna Hoff 06/05/2021, 2:14 PM

## 2021-06-05 NOTE — Care Management Obs Status (Signed)
[place] NOTIFICATION   Patient Details  Name: [name] MRN: [id number] Date of Birth: [birth year]   Medicare Observation Status Notification Given:  Yes    Bethena Roys, RN 06/05/2021, 4:23 PM

## 2021-06-05 NOTE — Progress Notes (Signed)
Notified MD of patient's pain that is unrelieved with Tylenol. Patient takes narcotic for pain at home. Awaiting response/orders.

## 2021-06-05 NOTE — Assessment & Plan Note (Signed)
-  Continue to replete

## 2021-06-06 DIAGNOSIS — G9589 Other specified diseases of spinal cord: Secondary | ICD-10-CM | POA: Diagnosis not present

## 2021-06-06 DIAGNOSIS — M797 Fibromyalgia: Secondary | ICD-10-CM | POA: Diagnosis present

## 2021-06-06 DIAGNOSIS — E531 Pyridoxine deficiency: Secondary | ICD-10-CM | POA: Diagnosis present

## 2021-06-06 DIAGNOSIS — R296 Repeated falls: Secondary | ICD-10-CM | POA: Diagnosis present

## 2021-06-06 DIAGNOSIS — R279 Unspecified lack of coordination: Secondary | ICD-10-CM | POA: Diagnosis not present

## 2021-06-06 DIAGNOSIS — Z7401 Bed confinement status: Secondary | ICD-10-CM | POA: Diagnosis not present

## 2021-06-06 DIAGNOSIS — Z8541 Personal history of malignant neoplasm of cervix uteri: Secondary | ICD-10-CM | POA: Diagnosis not present

## 2021-06-06 DIAGNOSIS — Z743 Need for continuous supervision: Secondary | ICD-10-CM | POA: Diagnosis not present

## 2021-06-06 DIAGNOSIS — Z8249 Family history of ischemic heart disease and other diseases of the circulatory system: Secondary | ICD-10-CM | POA: Diagnosis not present

## 2021-06-06 DIAGNOSIS — E559 Vitamin D deficiency, unspecified: Secondary | ICD-10-CM | POA: Diagnosis present

## 2021-06-06 DIAGNOSIS — Z6841 Body Mass Index (BMI) 40.0 and over, adult: Secondary | ICD-10-CM | POA: Diagnosis not present

## 2021-06-06 DIAGNOSIS — I10 Essential (primary) hypertension: Secondary | ICD-10-CM | POA: Diagnosis not present

## 2021-06-06 DIAGNOSIS — G4733 Obstructive sleep apnea (adult) (pediatric): Secondary | ICD-10-CM | POA: Diagnosis not present

## 2021-06-06 DIAGNOSIS — E785 Hyperlipidemia, unspecified: Secondary | ICD-10-CM | POA: Diagnosis not present

## 2021-06-06 DIAGNOSIS — Z888 Allergy status to other drugs, medicaments and biological substances status: Secondary | ICD-10-CM | POA: Diagnosis not present

## 2021-06-06 DIAGNOSIS — N390 Urinary tract infection, site not specified: Secondary | ICD-10-CM | POA: Diagnosis present

## 2021-06-06 DIAGNOSIS — Z9884 Bariatric surgery status: Secondary | ICD-10-CM | POA: Diagnosis not present

## 2021-06-06 DIAGNOSIS — Z833 Family history of diabetes mellitus: Secondary | ICD-10-CM | POA: Diagnosis not present

## 2021-06-06 DIAGNOSIS — A419 Sepsis, unspecified organism: Secondary | ICD-10-CM | POA: Diagnosis not present

## 2021-06-06 DIAGNOSIS — Z882 Allergy status to sulfonamides status: Secondary | ICD-10-CM | POA: Diagnosis not present

## 2021-06-06 DIAGNOSIS — E871 Hypo-osmolality and hyponatremia: Secondary | ICD-10-CM | POA: Diagnosis not present

## 2021-06-06 DIAGNOSIS — Z825 Family history of asthma and other chronic lower respiratory diseases: Secondary | ICD-10-CM | POA: Diagnosis not present

## 2021-06-06 DIAGNOSIS — G629 Polyneuropathy, unspecified: Secondary | ICD-10-CM | POA: Diagnosis present

## 2021-06-06 DIAGNOSIS — F419 Anxiety disorder, unspecified: Secondary | ICD-10-CM | POA: Diagnosis present

## 2021-06-06 DIAGNOSIS — Z20822 Contact with and (suspected) exposure to covid-19: Secondary | ICD-10-CM | POA: Diagnosis present

## 2021-06-06 DIAGNOSIS — E538 Deficiency of other specified B group vitamins: Secondary | ICD-10-CM | POA: Diagnosis present

## 2021-06-06 DIAGNOSIS — R531 Weakness: Secondary | ICD-10-CM | POA: Diagnosis not present

## 2021-06-06 DIAGNOSIS — F32A Depression, unspecified: Secondary | ICD-10-CM | POA: Diagnosis present

## 2021-06-06 DIAGNOSIS — M4802 Spinal stenosis, cervical region: Secondary | ICD-10-CM | POA: Diagnosis not present

## 2021-06-06 LAB — URINALYSIS, ROUTINE W REFLEX MICROSCOPIC
Bacteria, UA: NONE SEEN
Bilirubin Urine: NEGATIVE
Glucose, UA: NEGATIVE mg/dL
Hgb urine dipstick: NEGATIVE
Ketones, ur: NEGATIVE mg/dL
Leukocytes,Ua: NEGATIVE
Nitrite: POSITIVE — AB
Protein, ur: NEGATIVE mg/dL
Specific Gravity, Urine: 1.016 (ref 1.005–1.030)
pH: 6 (ref 5.0–8.0)

## 2021-06-06 LAB — ALDOLASE: Aldolase: 3.6 U/L (ref 3.3–10.3)

## 2021-06-06 MED ORDER — SODIUM CHLORIDE 0.9 % IV SOLN
6.2500 mg | Freq: Four times a day (QID) | INTRAVENOUS | Status: DC | PRN
  Administered 2021-06-06: 6.25 mg via INTRAVENOUS
  Filled 2021-06-06 (×2): qty 0.25

## 2021-06-06 NOTE — NC FL2 (Signed)
[place] MEDICAID FL2 LEVEL OF CARE SCREENING TOOL     IDENTIFICATION  Patient Name: [name] Birthdate: 24-Apr-1971 Sex: female Admission Date (Current Location): 06/04/2021  [facility] and [place] Number:  Herbalist and Address:  [facility], [place] [address] [place], [place] [address]      Provider Number: [id number]  Attending Physician Name and Address:  Caren Griffins, MD  Relative Name and Phone Number:       Current Level of Care: Hospital Recommended Level of Care: [place] Prior Approval Number:    Date Approved/Denied:   PASRR Number: Level 2  Discharge Plan: SNF    Current Diagnoses: Patient Active Problem List   Diagnosis Date Noted   Compressive cervical cord myelomalacia ([place]) 06/05/2021   B12 deficiency 06/05/2021   Folate deficiency 06/05/2021   Vitamin D deficiency 06/05/2021   Leg weakness, bilateral 06/04/2021   Cervical atypia 01/06/2021   Steatosis of liver 01/06/2021   Hyponatremia 07/27/2019   Urinary urgency 04/04/2018   Chronic narcotic use 09/07/2016   Acute blood loss anemia 01/14/2016   Multiple gastric ulcers    PAN (polyarteritis nodosa) ([place]) 11/24/2015   GERD (gastroesophageal reflux disease) 01/08/2014   Routine general medical examination at a health care facility 04/23/2013   Bariatric surgery status 10/31/2012   S/P skin biopsy 10/31/2012   Psoriasis 01/09/2012   DDD (degenerative disc disease), lumbar 11/30/2011   Migraine headache    [name] (obstructive sleep apnea) 11/16/2010   HYPERGLYCEMIA, FASTING 10/08/2009   CONTRACTURE OF TENDON 08/17/2009   PARESTHESIA, HANDS 12/23/2008   Leg pain, right 11/05/2008   ADVERSE DRUG REACTION 04/03/2008   PERIPHERAL EDEMA 03/26/2008   Obesity, Class III, BMI 40-49.9 (morbid obesity) ([place]) 01/03/2007   Cellulitis 01/03/2007   Hyperlipidemia 09/22/2006   Fibromyalgia 09/20/2006   Anxiety and depression 06/28/2006   Essential  hypertension 06/28/2006    Orientation RESPIRATION BLADDER Height & Weight     Self, Time, Situation, Place  Normal Incontinent, External catheter Weight: 282 lb 3 oz (128 kg) Height:  5' 2.5" (158.8 cm)  BEHAVIORAL SYMPTOMS/MOOD NEUROLOGICAL BOWEL NUTRITION STATUS      Incontinent Diet (See DC summary)  AMBULATORY STATUS COMMUNICATION OF NEEDS Skin   Extensive Assist Verbally Skin abrasions (Stage 2 ulcer on thigh, Skin tear, MASD on thigh and hip)                       Personal Care Assistance Level of Assistance  Bathing, Feeding, Dressing Bathing Assistance: Maximum assistance Feeding assistance: Limited assistance Dressing Assistance: Maximum assistance     Functional Limitations Info  Sight, Hearing, Speech Sight Info: Impaired Hearing Info: Adequate Speech Info: Adequate    SPECIAL CARE FACTORS FREQUENCY  PT (By licensed PT), OT (By licensed OT)     PT Frequency: 5x week OT Frequency: 5x week            Contractures Contractures Info: Not present    Additional Factors Info  Code Status, Allergies, Psychotropic Code Status Info: Full Allergies Info: Niacin   Sulfamethoxazole-trimethoprim   Aspirin   Bactrim   Benzoin Compound   Cephalexin   Clindamycin/lincomycin   Iohexol   Lisinopril   Naproxen   Pnu-imune (Pneumococcal Polysaccharide Vaccine)   Sulfonamide Derivatives Psychotropic Info: Ariprazole, Wellbutrin, Buspar, Duloxetine         Current Medications (06/06/2021):  This is the current hospital active medication list Current Facility-Administered Medications  Medication Dose Route Frequency Provider Last Rate Last Admin   acetaminophen (TYLENOL) tablet 650 mg  650 mg Oral Q6H PRN Caren Griffins, MD   650 mg at 06/05/21 2048   Or   acetaminophen (TYLENOL) suppository 650 mg  650 mg Rectal Q6H PRN Caren Griffins, MD       ALPRAZolam Duanne Moron) tablet 0.5 mg  0.5 mg Oral BID PRN Caren Griffins, MD   0.5 mg at 06/05/21 2050   ARIPiprazole  (ABILIFY) tablet 10 mg  10 mg Oral q AM Caren Griffins, MD   10 mg at 06/06/21 7414   buPROPion (WELLBUTRIN XL) 24 hr tablet 300 mg  300 mg Oral Daily Marzetta Board M, MD   300 mg at 06/06/21 0900   busPIRone (BUSPAR) tablet 30 mg  30 mg Oral BID Caren Griffins, MD   30 mg at 06/06/21 2395   cyanocobalamin ((VITAMIN B-12)) injection 1,000 mcg  1,000 mcg Intramuscular Daily Caren Griffins, MD   1,000 mcg at 06/06/21 0903   DULoxetine (CYMBALTA) DR capsule 60 mg  60 mg Oral BID Caren Griffins, MD   60 mg at 06/06/21 0900   enoxaparin (LOVENOX) injection 65 mg  0.5 mg/kg Subcutaneous Q24H Reome, Earle J, RPH   65 mg at 06/05/21 1907   ferrous sulfate tablet 325 mg  325 mg Oral Q breakfast Caren Griffins, MD   325 mg at 32/02/33 4356   folic acid (FOLVITE) tablet 1 mg  1 mg Oral Daily Caren Griffins, MD   1 mg at 06/06/21 0859   furosemide (LASIX) tablet 40 mg  40 mg Oral Daily Caren Griffins, MD   40 mg at 06/06/21 0901   gabapentin (NEURONTIN) tablet 1,200 mg  1,200 mg Oral QHS Reome, Earle J, RPH   1,200 mg at 06/05/21 2100   gabapentin (NEURONTIN) tablet 600 mg  600 mg Oral BID Reome, Earle J, RPH   600 mg at 06/06/21 0901   nebivolol (BYSTOLIC) tablet 10 mg  10 mg Oral Daily Caren Griffins, MD   10 mg at 06/06/21 0901   Oxcarbazepine (TRILEPTAL) tablet 300 mg  300 mg Oral BID Caren Griffins, MD   300 mg at 06/06/21 0900   pantoprazole (PROTONIX) EC tablet 40 mg  40 mg Oral BID AC Caren Griffins, MD   40 mg at 06/06/21 8616   pentoxifylline (TRENTAL) CR tablet 400 mg  400 mg Oral TID WC Caren Griffins, MD   400 mg at 06/06/21 1237   rosuvastatin (CRESTOR) tablet 20 mg  20 mg Oral Daily Marzetta Board M, MD   20 mg at 06/06/21 0900   thiamine tablet 100 mg  100 mg Oral Daily Donnetta Simpers, MD   100 mg at 06/06/21 0901   Vitamin D (Ergocalciferol) (DRISDOL) capsule 50,000 Units  50,000 Units Oral Daily Caren Griffins, MD   50,000 Units at 06/06/21 0901      Discharge Medications: Please see discharge summary for a list of discharge medications.  Relevant Imaging Results:  Relevant Lab Results:   Additional Information SS# [place], [id number]

## 2021-06-06 NOTE — TOC Initial Note (Addendum)
Transition of Care [facility]) - Initial/Assessment Note    Patient Details  Name: [name] MRN: [id number] Date of Birth: 08/29/1970  [facility] CM/SW Contact:    Coralee Pesa, [place] Phone Number: 06/06/2021, 1:27 PM  Clinical Narrative:                 CSW met with pt and son at bedside to discuss SNF recommendation. Pt states that her doctors would like her to go to CIR. CSW shared what CIR and MD had noted this morning, but stated that CSW would update the medical team of her wishes. Pt is agreeable to SNF faxout for a backup and would like to remain in [place] near her home. She notes no preference, but states there is one on [employer] she would be interested in. Pt requests we confirm with facilities that they will not need her disability check, and that Medicare will cover. She has had 2 vaccines and 2 boosters for covid. CSW will complete referrals and TOC will continue to follow for DC needs.  Expected Discharge Plan: Skilled Nursing Facility Barriers to Discharge: SNF Pending bed offer, Continued Medical Work up, Ship broker   Patient Goals and CMS Choice Patient states their goals for this hospitalization and ongoing recovery are:: Pt states she would like to be able to return home more independently. CMS Medicare.gov Compare Post Acute Care list provided to:: Patient Choice offered to / list presented to : Patient  Expected Discharge Plan and Services Expected Discharge Plan: [place] Choice: [place] arrangements for the past 2 months: Single Family Home                                      Prior Living Arrangements/Services Living arrangements for the past 2 months: Single Family Home Lives with:: Adult Children, Parents Patient language and need for interpreter reviewed:: Yes Do you feel safe going back to the place where you live?: Yes      Need for Family  Participation in Patient Care: Yes (Comment) Care giver support system in place?: Yes (comment)   Criminal Activity/Legal Involvement Pertinent to Current Situation/Hospitalization: No - Comment as needed  Activities of Daily Living Home Assistive Devices/Equipment: Bathtub lift, Cane (specify quad or straight) ADL Screening (condition at time of admission) Patient's cognitive ability adequate to safely complete daily activities?: No Is the patient deaf or have difficulty hearing?: No Does the patient have difficulty seeing, even when wearing glasses/contacts?: No Does the patient have difficulty concentrating, remembering, or making decisions?: No Patient able to express need for assistance with ADLs?: Yes Does the patient have difficulty dressing or bathing?: Yes Independently performs ADLs?: No Communication: Independent Dressing (OT): Independent with device (comment) Grooming: Needs assistance Is this a change from baseline?: Change from baseline, expected to last >3 days Feeding: Needs assistance Is this a change from baseline?: Change from baseline, expected to last >3 days Bathing: Needs assistance Is this a change from baseline?: Change from baseline, expected to last >3 days Toileting: Needs assistance Is this a change from baseline?: Change from baseline, expected to last >3days In/Out Bed: Needs assistance Is this a change from baseline?: Change from baseline, expected to last >3 days Walks in Home: Needs assistance Is this a change from baseline?: Change from baseline, expected to last >3 days Does  the patient have difficulty walking or climbing stairs?: Yes Weakness of Legs: Both Weakness of Arms/Hands: Both  Permission Sought/Granted Permission sought to share information with : Family Supports Permission granted to share information with : No              Emotional Assessment Appearance:: Appears stated age Attitude/Demeanor/Rapport: Engaged Affect  (typically observed): Appropriate Orientation: : Oriented to Self, Oriented to Place, Oriented to  Time, Oriented to Situation Alcohol / Substance Use: Not Applicable Psych Involvement: No (comment)  Admission diagnosis:  Weakness [R53.1] Leg weakness, bilateral [R29.898] Patient Active Problem List   Diagnosis Date Noted   Compressive cervical cord myelomalacia ([place]) 06/05/2021   B12 deficiency 06/05/2021   Folate deficiency 06/05/2021   Vitamin D deficiency 06/05/2021   Leg weakness, bilateral 06/04/2021   Cervical atypia 01/06/2021   Steatosis of liver 01/06/2021   Hyponatremia 07/27/2019   Urinary urgency 04/04/2018   Chronic narcotic use 09/07/2016   Acute blood loss anemia 01/14/2016   Multiple gastric ulcers    PAN (polyarteritis nodosa) ([place]) 11/24/2015   GERD (gastroesophageal reflux disease) 01/08/2014   Routine general medical examination at a health care facility 04/23/2013   Bariatric surgery status 10/31/2012   S/P skin biopsy 10/31/2012   Psoriasis 01/09/2012   DDD (degenerative disc disease), lumbar 11/30/2011   Migraine headache    [name] (obstructive sleep apnea) 11/16/2010   HYPERGLYCEMIA, FASTING 10/08/2009   CONTRACTURE OF TENDON 08/17/2009   PARESTHESIA, HANDS 12/23/2008   Leg pain, right 11/05/2008   ADVERSE DRUG REACTION 04/03/2008   PERIPHERAL EDEMA 03/26/2008   Obesity, Class III, BMI 40-49.9 (morbid obesity) ([place]) 01/03/2007   Cellulitis 01/03/2007   Hyperlipidemia 09/22/2006   Fibromyalgia 09/20/2006   Anxiety and depression 06/28/2006   Essential hypertension 06/28/2006   PCP:  [name], MD Pharmacy:   [place], [place] - [address] [place] [address] Phone: [phone] Fax: [phone]     Social Determinants of Health (SDOH) Interventions    Readmission Risk Interventions No flowsheet data found.

## 2021-06-06 NOTE — Progress Notes (Signed)
Inpatient Rehab Admissions Coordinator:   CIR consult received. Case reviewed with Physical Medicine and Rehab Physician, Dr. Leeroy Cha. At this time, we feel that Pt. does demonstrate medical necessity to justify in hospital rehabilitation/CIR. Additionally, PT and OT are both recommending SNF, as she is total assist with transfers and does not appear able to tolerate the intensity of CIR. I will not pursue a CIR admission for this Pt.   Recommend other rehab venues to be pursued.  Please contact me with any questions.  Clemens Catholic, [place], [place] Admissions Coordinator  [phone] ([place]) [phone] (office)

## 2021-06-06 NOTE — Progress Notes (Signed)
PROGRESS NOTE  [name] EXN:[id number] DOB: [birth year] DOA: 06/04/2021 PCP: [name], MD   LOS: 0 days   Brief Narrative / Interim history:  51 y.o. female with medical history significant of obesity, class III, depression/anxiety, hypertension, hyperlipidemia, comes into the hospital with progressive lower extremity weakness.  Patient noted that she has had increased weakness in her legs since last Thanksgiving.  Neurology consulted.  MRI of the C-spine concerning for compressive myelomalacia and neurosurgery consulted.  Subjective / 24h Interval events: Not able to walk, appreciates that her strength is a little bit better  Assessment & Plan: Compressive cervical cord myelomalacia ([place]) -Patient presented to the hospital with significant progressive lower extremity weakness to the point that she cannot walk.  MRI of the C-spine showed moderate to severe diffuse spinal stenosis C3-4 through C6-7, also C3-4 cord signal abnormalities suspicious for is compressive myelomalacia.  We will eventually need 3 level ACDF at C3-7, but not urgent.  Anxiety and depression- (present on admission) - Continue home medications  Essential hypertension - Continue home medications  Hyperlipidemia- (present on admission) - Continue home statin  B12 deficiency - B12 on the lower side of normal, given neurodeficits replete IV.  She used to be on IV B12, but stopped because she was told levels normalized  Vitamin D deficiency -Continue to replete  Folate deficiency -Replete folate levels  Leg weakness, bilateral- (present on admission) - Needs SNF, placement pending per SW  Obesity, Class III, BMI 40-49.9 (morbid obesity) ([place]) - Patient would benefit from weight loss    Scheduled Meds:  ARIPiprazole  10 mg Oral q AM   buPROPion  300 mg Oral Daily   busPIRone  30 mg Oral BID   cyanocobalamin  1,000 mcg Intramuscular Daily   DULoxetine  60 mg Oral BID   enoxaparin (LOVENOX)  injection  0.5 mg/kg Subcutaneous Q24H   ferrous sulfate  325 mg Oral Q breakfast   folic acid  1 mg Oral Daily   furosemide  40 mg Oral Daily   gabapentin  1,200 mg Oral QHS   gabapentin  600 mg Oral BID   nebivolol  10 mg Oral Daily   Oxcarbazepine  300 mg Oral BID   pantoprazole  40 mg Oral BID AC   pentoxifylline  400 mg Oral TID WC   rosuvastatin  20 mg Oral Daily   thiamine  100 mg Oral Daily   Vitamin D (Ergocalciferol)  50,000 Units Oral Daily   Continuous Infusions: PRN Meds:.acetaminophen **OR** acetaminophen, ALPRAZolam  Diet Orders (From admission, onward)     Start     Ordered   06/04/21 1822  Diet regular Room service appropriate? Yes; Fluid consistency: Thin  Diet effective now       Question Answer Comment  Room service appropriate? Yes   Fluid consistency: Thin      06/04/21 1821            DVT prophylaxis:    Lab Results  Component Value Date   PLT 278 06/04/2021      Code Status: Full Code  Family Communication: No family at bedside  Status is: Observation  The patient will require care spanning > 2 midnights and should be moved to inpatient because: Persistent weakness, PT eval pending, likely needs rehab, neurosurgical consultation pending  Level of care: Med-Surg  Consultants:  Neurology Neurosurgery  Procedures:  none  Microbiology  none  Antimicrobials: none    Objective: Vitals:   06/05/21 1940  06/05/21 2309 06/06/21 0332 06/06/21 0741  BP: 116/64 101/61 107/70 115/69  Pulse: 85 65 (!) 55 (!) 59  Resp: 18 16 18 18   Temp: 98.8 F (37.1 C) 98.8 F (37.1 C) 98.6 F (37 C) 98 F (36.7 C)  TempSrc: Oral Oral Oral Oral  SpO2: 96% 95% 99% 97%  Weight:      Height:        Intake/Output Summary (Last 24 hours) at 06/06/2021 1111 Last data filed at 06/06/2021 1051 Gross per 24 hour  Intake 480 ml  Output 1800 ml  Net -1320 ml   Wt Readings from Last 3 Encounters:  06/04/21 128 kg  04/27/21 127.9 kg  02/10/21 (!)  141.3 kg    Examination:  Constitutional: NAD Eyes: lids and conjunctivae normal, no scleral icterus ENMT: mmm Neck: normal, supple Respiratory: clear to auscultation bilaterally, no wheezing, no crackles. Normal respiratory effort.  Cardiovascular: Regular rate and rhythm, no murmurs / rubs / gallops. No LE edema. Abdomen: soft, no distention, no tenderness. Bowel sounds positive.  Skin: no rashes Neurologic: no focal deficits, mild weakness in the proximal muscles lower extremities   Data Reviewed: I have independently reviewed following labs and imaging studies  CBC Recent Labs  Lab 06/04/21 1202  WBC 5.7  HGB 13.0  HCT 42.5  PLT 278  MCV 91.6  MCH 28.0  MCHC 30.6  RDW 14.7  LYMPHSABS 1.3  MONOABS 0.4  EOSABS 0.5  BASOSABS 0.0     Recent Labs  Lab 06/04/21 1202 06/04/21 1739  NA 135  --   K 4.1  --   CL 103  --   CO2 25  --   GLUCOSE 90  --   BUN 8  --   CREATININE 0.50  --   CALCIUM 8.8*  --   AST 13*  --   ALT 13  --   ALKPHOS 106  --   BILITOT 0.2*  --   ALBUMIN 2.8*  --   MG 1.8  --   TSH  --  2.100  HGBA1C  --  5.1    ------------------------------------------------------------------------------------------------------------------ No results for input(s): CHOL, HDL, LDLCALC, TRIG, CHOLHDL, LDLDIRECT in the last 72 hours.  Lab Results  Component Value Date   HGBA1C 5.1 06/04/2021   ------------------------------------------------------------------------------------------------------------------ Recent Labs    06/04/21 1739  TSH 2.100     Cardiac Enzymes No results for input(s): CKMB, TROPONINI, MYOGLOBIN in the last 168 hours.  Invalid input(s): CK ------------------------------------------------------------------------------------------------------------------ No results found for: BNP  CBG: No results for input(s): GLUCAP in the last 168 hours.  Recent Results (from the past 240 hour(s))  Resp Panel by RT-PCR (Flu A&B,  Covid) Nasopharyngeal Swab     Status: None   Collection Time: 06/04/21 12:08 PM   Specimen: Nasopharyngeal Swab; Nasopharyngeal(NP) swabs in vial transport medium  Result Value Ref Range Status   SARS Coronavirus 2 by RT PCR NEGATIVE NEGATIVE Final    Comment: (NOTE) SARS-CoV-2 target nucleic acids are NOT DETECTED.  The SARS-CoV-2 RNA is generally detectable in upper respiratory specimens during the acute phase of infection. The lowest concentration of SARS-CoV-2 viral copies this assay can detect is 138 copies/mL. A negative result does not preclude SARS-Cov-2 infection and should not be used as the sole basis for treatment or other patient management decisions. A negative result may occur with  improper specimen collection/handling, submission of specimen other than nasopharyngeal swab, presence of viral mutation(s) within the areas targeted by this assay, and  inadequate number of viral copies(<138 copies/mL). A negative result must be combined with clinical observations, patient history, and epidemiological information. The expected result is Negative.  Fact Sheet for Patients:  EntrepreneurPulse.com.au  Fact Sheet for Healthcare Providers:  IncredibleEmployment.be  This test is no t yet approved or cleared by the [place] FDA and  has been authorized for detection and/or diagnosis of SARS-CoV-2 by FDA under an Emergency Use Authorization (EUA). This EUA will remain  in effect (meaning this test can be used) for the duration of the COVID-19 declaration under Section 564(b)(1) of the Act, 21 U.S.C.section 360bbb-3(b)(1), unless the authorization is terminated  or revoked sooner.       Influenza A by PCR NEGATIVE NEGATIVE Final   Influenza B by PCR NEGATIVE NEGATIVE Final    Comment: (NOTE) The Xpert Xpress SARS-CoV-2/FLU/RSV plus assay is intended as an aid in the diagnosis of influenza from Nasopharyngeal swab specimens and should  not be used as a sole basis for treatment. Nasal washings and aspirates are unacceptable for Xpert Xpress SARS-CoV-2/FLU/RSV testing.  Fact Sheet for Patients: EntrepreneurPulse.com.au  Fact Sheet for Healthcare Providers: IncredibleEmployment.be  This test is not yet approved or cleared by the [place] FDA and has been authorized for detection and/or diagnosis of SARS-CoV-2 by FDA under an Emergency Use Authorization (EUA). This EUA will remain in effect (meaning this test can be used) for the duration of the COVID-19 declaration under Section 564(b)(1) of the Act, 21 U.S.C. section 360bbb-3(b)(1), unless the authorization is terminated or revoked.  Performed at [facility], [place] [address] [place], [place] [address]       Radiology Studies: No results found.   Marzetta Board, MD, PhD [facility]  Between 7 am - 7 pm I am available, please contact me via Amion (for emergencies) or Securechat (non urgent messages)  Between 7 pm - 7 am I am not available, please contact night coverage MD/APP via Amion

## 2021-06-06 NOTE — Progress Notes (Signed)
Patient ID: [name], female   DOB: August 03, 1970, 51 y.o.   MRN: [id number] BP 140/70 (BP Location: Left Arm)    Pulse 69    Temp 97.9 F (36.6 C) (Oral)    Resp 18    Ht 5' 2.5" (1.588 m)    Wt 128 kg    SpO2 95%    BMI 50.79 kg/m  Alert and oriented x 4 Speech is clear and fluent Moving all extremities well Clear in her desire to not have surgery.  Doing well, and stable at this time.

## 2021-06-06 NOTE — Social Work (Signed)
Please be advised that the above-named patient will require a short-term nursing home stay-anticipated 30 days or less for rehabilitation and strengthening. The plan is for return home.  

## 2021-06-06 NOTE — Assessment & Plan Note (Addendum)
-   Supposed to go to SNF but now wants to go home instead

## 2021-06-07 LAB — COMPREHENSIVE METABOLIC PANEL
ALT: 12 U/L (ref 0–44)
AST: 11 U/L — ABNORMAL LOW (ref 15–41)
Albumin: 2.7 g/dL — ABNORMAL LOW (ref 3.5–5.0)
Alkaline Phosphatase: 99 U/L (ref 38–126)
Anion gap: 11 (ref 5–15)
BUN: 10 mg/dL (ref 6–20)
CO2: 24 mmol/L (ref 22–32)
Calcium: 8.4 mg/dL — ABNORMAL LOW (ref 8.9–10.3)
Chloride: 100 mmol/L (ref 98–111)
Creatinine, Ser: 0.52 mg/dL (ref 0.44–1.00)
GFR, Estimated: >60 mL/min (ref >60)
Glucose, Bld: 80 mg/dL (ref 70–99)
Potassium: 3.5 mmol/L (ref 3.5–5.1)
Sodium: 135 mmol/L (ref 135–145)
Total Bilirubin: 0.2 mg/dL — ABNORMAL LOW (ref 0.3–1.2)
Total Protein: 6.3 g/dL — ABNORMAL LOW (ref 6.5–8.1)

## 2021-06-07 LAB — CBC
HCT: 40.4 % (ref 36.0–46.0)
Hemoglobin: 12.8 g/dL (ref 12.0–15.0)
MCH: 27.8 pg (ref 26.0–34.0)
MCHC: 31.7 g/dL (ref 30.0–36.0)
MCV: 87.6 fL (ref 80.0–100.0)
Platelets: 281 10*3/uL (ref 150–400)
RBC: 4.61 MIL/uL (ref 3.87–5.11)
RDW: 14.7 % (ref 11.5–15.5)
WBC: 6.8 10*3/uL (ref 4.0–10.5)
nRBC: 0 % (ref 0.0–0.2)

## 2021-06-07 LAB — VITAMIN B6: Vitamin B6: 1.6 ug/L — ABNORMAL LOW (ref 3.4–65.2)

## 2021-06-07 NOTE — Progress Notes (Signed)
PROGRESS NOTE  [name] KCL:[id number] DOB: 1970-10-10 DOA: 06/04/2021 PCP: [name], MD   LOS: 1 day   Brief Narrative / Interim history:  51 y.o. female with medical history significant of obesity, class III, depression/anxiety, hypertension, hyperlipidemia, comes into the hospital with progressive lower extremity weakness.  Patient noted that she has had increased weakness in her legs since last Thanksgiving.  Neurology consulted.  MRI of the C-spine concerning for compressive myelomalacia and neurosurgery consulted.  Subjective / 24h Interval events: She feels stronger.  Assessment & Plan: Compressive cervical cord myelomalacia ([place]) -Patient presented to the hospital with significant progressive lower extremity weakness to the point that she cannot walk.  MRI of the C-spine showed moderate to severe diffuse spinal stenosis C3-4 through C6-7, also C3-4 cord signal abnormalities suspicious for is compressive myelomalacia.  She will eventually need 3 level ACDF at C3-7, but not urgent. Outpatient follow up with Dr Christella Noa  Anxiety and depression- (present on admission) - Continue home medications  Essential hypertension - Continue home medications  Hyperlipidemia- (present on admission) - Continue home statin  B12 deficiency - B12 on the lower side of normal, given neurodeficits replete IV.  She used to be on IV B12, but stopped because she was told levels normalized  Vitamin D deficiency -Continue to replete  Folate deficiency -Replete folate levels  Leg weakness, bilateral- (present on admission) - Needs SNF, placement pending per SW  Obesity, Class III, BMI 40-49.9 (morbid obesity) ([place]) - Patient would benefit from weight loss    Scheduled Meds:  ARIPiprazole  10 mg Oral q AM   buPROPion  300 mg Oral Daily   busPIRone  30 mg Oral BID   cyanocobalamin  1,000 mcg Intramuscular Daily   DULoxetine  60 mg Oral BID   enoxaparin (LOVENOX) injection  0.5  mg/kg Subcutaneous Q24H   ferrous sulfate  325 mg Oral Q breakfast   folic acid  1 mg Oral Daily   furosemide  40 mg Oral Daily   gabapentin  1,200 mg Oral QHS   gabapentin  600 mg Oral BID   nebivolol  10 mg Oral Daily   Oxcarbazepine  300 mg Oral BID   pantoprazole  40 mg Oral BID AC   pentoxifylline  400 mg Oral TID WC   rosuvastatin  20 mg Oral Daily   thiamine  100 mg Oral Daily   Vitamin D (Ergocalciferol)  50,000 Units Oral Daily   Continuous Infusions:  promethazine (PHENERGAN) injection (IM or IVPB) Stopped (06/06/21 2036)   PRN Meds:.acetaminophen **OR** acetaminophen, ALPRAZolam, promethazine (PHENERGAN) injection (IM or IVPB)  Diet Orders (From admission, onward)     Start     Ordered   06/04/21 1822  Diet regular Room service appropriate? Yes; Fluid consistency: Thin  Diet effective now       Question Answer Comment  Room service appropriate? Yes   Fluid consistency: Thin      06/04/21 1821            DVT prophylaxis:    Lab Results  Component Value Date   PLT 281 06/07/2021      Code Status: Full Code  Family Communication: No family at bedside  Status is: Inpatient  Level of care: Med-Surg  Consultants:  Neurology Neurosurgery  Procedures:  none  Microbiology  none  Antimicrobials: none    Objective: Vitals:   06/06/21 1959 06/06/21 2329 06/07/21 0327 06/07/21 1203  BP: 115/63 123/67 106/60 106/65  Pulse:  67 65 65 69  Resp: 18 20 16 18   Temp: 98.2 F (36.8 C) 98.9 F (37.2 C) 97.8 F (36.6 C) 97.7 F (36.5 C)  TempSrc: Oral Oral  Oral  SpO2: 99% 97% 95% 97%  Weight:      Height:        Intake/Output Summary (Last 24 hours) at 06/07/2021 1428 Last data filed at 06/07/2021 1159 Gross per 24 hour  Intake 530 ml  Output 950 ml  Net -420 ml    Wt Readings from Last 3 Encounters:  06/04/21 128 kg  04/27/21 127.9 kg  02/10/21 (!) 141.3 kg    Examination:  Constitutional: NAD Eyes: lids and conjunctivae normal, no  scleral icterus ENMT: mmm Neck: normal, supple Respiratory: clear to auscultation bilaterally, no wheezing, no crackles. Normal respiratory effort.  Cardiovascular: Regular rate and rhythm, no murmurs / rubs / gallops. No LE edema. Abdomen: soft, no distention, no tenderness. Bowel sounds positive.  Skin: no rashes Neurologic: no focal deficits, mild weakness in the proximal muscles lower extremities   Data Reviewed: I have independently reviewed following labs and imaging studies  CBC Recent Labs  Lab 06/04/21 1202 06/07/21 0332  WBC 5.7 6.8  HGB 13.0 12.8  HCT 42.5 40.4  PLT 278 281  MCV 91.6 87.6  MCH 28.0 27.8  MCHC 30.6 31.7  RDW 14.7 14.7  LYMPHSABS 1.3  --   MONOABS 0.4  --   EOSABS 0.5  --   BASOSABS 0.0  --      Recent Labs  Lab 06/04/21 1202 06/04/21 1739 06/07/21 0332  NA 135  --  135  K 4.1  --  3.5  CL 103  --  100  CO2 25  --  24  GLUCOSE 90  --  80  BUN 8  --  10  CREATININE 0.50  --  0.52  CALCIUM 8.8*  --  8.4*  AST 13*  --  11*  ALT 13  --  12  ALKPHOS 106  --  99  BILITOT 0.2*  --  0.2*  ALBUMIN 2.8*  --  2.7*  MG 1.8  --   --   TSH  --  2.100  --   HGBA1C  --  5.1  --     ------------------------------------------------------------------------------------------------------------------ No results for input(s): CHOL, HDL, LDLCALC, TRIG, CHOLHDL, LDLDIRECT in the last 72 hours.  Lab Results  Component Value Date   HGBA1C 5.1 06/04/2021   ------------------------------------------------------------------------------------------------------------------ Recent Labs    06/04/21 1739  TSH 2.100     Cardiac Enzymes No results for input(s): CKMB, TROPONINI, MYOGLOBIN in the last 168 hours.  Invalid input(s): CK ------------------------------------------------------------------------------------------------------------------ No results found for: BNP  CBG: No results for input(s): GLUCAP in the last 168 hours.  Recent Results  (from the past 240 hour(s))  Resp Panel by RT-PCR (Flu A&B, Covid) Nasopharyngeal Swab     Status: None   Collection Time: 06/04/21 12:08 PM   Specimen: Nasopharyngeal Swab; Nasopharyngeal(NP) swabs in vial transport medium  Result Value Ref Range Status   SARS Coronavirus 2 by RT PCR NEGATIVE NEGATIVE Final    Comment: (NOTE) SARS-CoV-2 target nucleic acids are NOT DETECTED.  The SARS-CoV-2 RNA is generally detectable in upper respiratory specimens during the acute phase of infection. The lowest concentration of SARS-CoV-2 viral copies this assay can detect is 138 copies/mL. A negative result does not preclude SARS-Cov-2 infection and should not be used as the sole basis for treatment or other patient  management decisions. A negative result may occur with  improper specimen collection/handling, submission of specimen other than nasopharyngeal swab, presence of viral mutation(s) within the areas targeted by this assay, and inadequate number of viral copies(<138 copies/mL). A negative result must be combined with clinical observations, patient history, and epidemiological information. The expected result is Negative.  Fact Sheet for Patients:  EntrepreneurPulse.com.au  Fact Sheet for Healthcare Providers:  IncredibleEmployment.be  This test is no t yet approved or cleared by the [place] FDA and  has been authorized for detection and/or diagnosis of SARS-CoV-2 by FDA under an Emergency Use Authorization (EUA). This EUA will remain  in effect (meaning this test can be used) for the duration of the COVID-19 declaration under Section 564(b)(1) of the Act, 21 U.S.C.section 360bbb-3(b)(1), unless the authorization is terminated  or revoked sooner.       Influenza A by PCR NEGATIVE NEGATIVE Final   Influenza B by PCR NEGATIVE NEGATIVE Final    Comment: (NOTE) The Xpert Xpress SARS-CoV-2/FLU/RSV plus assay is intended as an aid in the  diagnosis of influenza from Nasopharyngeal swab specimens and should not be used as a sole basis for treatment. Nasal washings and aspirates are unacceptable for Xpert Xpress SARS-CoV-2/FLU/RSV testing.  Fact Sheet for Patients: EntrepreneurPulse.com.au  Fact Sheet for Healthcare Providers: IncredibleEmployment.be  This test is not yet approved or cleared by the [place] FDA and has been authorized for detection and/or diagnosis of SARS-CoV-2 by FDA under an Emergency Use Authorization (EUA). This EUA will remain in effect (meaning this test can be used) for the duration of the COVID-19 declaration under Section 564(b)(1) of the Act, 21 U.S.C. section 360bbb-3(b)(1), unless the authorization is terminated or revoked.  Performed at [facility], [place] [address] [place], [place] [address]       Radiology Studies: No results found.   Marzetta Board, MD, PhD [facility]  Between 7 am - 7 pm I am available, please contact me via Amion (for emergencies) or Securechat (non urgent messages)  Between 7 pm - 7 am I am not available, please contact night coverage MD/APP via Amion

## 2021-06-07 NOTE — Progress Notes (Signed)
Occupational Therapy Treatment Patient Details Name: [name] MRN: [id number] DOB: 1971-03-06 Today's Date: 06/07/2021   History of present illness 51 y/o female presented to ED on 06/04/21 for evaluation of worsening bilateral LE weakness over past 6 months after falls in Aug/Sept of 2022. MRI showed moderate to severe spinal stenosis C3-7 and suspected subtle patchy cord signal abnormality at level of C3-4, suspicious of compressive myelomalacia. Waiting for neurosurgical consult. PMH: obesity, HTN, sleep apnea   OT comments  Patient received in supine and agreeable to OT treatment. Patient was mod assist to get to EOB with assistance for trunk and verbal cues for rail use. Patient performed grooming seated on EOB and washed hair with shampoo cap. Patient performed sitting balance activities before returning to supine. Once supine patient was noticed to have been incontinent in bed and required assistance of 2 for cleaning with patient rolling in bed. Acute OT to continue to follow.    Recommendations for follow up therapy are one component of a multi-disciplinary discharge planning process, led by the attending physician.  Recommendations may be updated based on patient status, additional functional criteria and insurance authorization.    Follow Up Recommendations  Skilled nursing-short term rehab (<3 hours/day)    Assistance Recommended at Discharge Frequent or constant Supervision/Assistance  Patient can return home with the following  Two people to help with walking and/or transfers;Two people to help with bathing/dressing/bathroom;Assistance with cooking/housework;Direct supervision/assist for medications management   Equipment Recommendations  Other (comment) (TBD)    Recommendations for Other Services      Precautions / Restrictions Precautions Precautions: Fall Restrictions Weight Bearing Restrictions: No       Mobility Bed Mobility Overal bed mobility: Needs  Assistance Bed Mobility: Rolling, Sidelying to Sit, Sit to Sidelying Rolling: Min assist Sidelying to sit: Mod assist     Sit to sidelying: Mod assist General bed mobility comments: patient required assistance with trunk to get to EOB and assistance with LE to go back to supine    Transfers                         Balance Overall balance assessment: Needs assistance Sitting-balance support: Single extremity supported, Feet supported Sitting balance-Leahy Scale: Fair Sitting balance - Comments: reliant on one extremity support for sitting balance                                   ADL either performed or assessed with clinical judgement   ADL Overall ADL's : Needs assistance/impaired     Grooming: Set up;Sitting Grooming Details (indicate cue type and reason): performed seated on EOB     Lower Body Bathing: Maximal assistance;+2 for physical assistance;+2 for safety/equipment;Bed level Lower Body Bathing Details (indicate cue type and reason): patient requied bathing following incontinence in bed Upper Body Dressing : Min guard;Sitting                     General ADL Comments: patient seen for grooming seated on EOB.  After returning to supine patient was incontinent and required assistance with cleaning.    Extremity/Trunk Assessment              Vision       Perception     Praxis      Cognition Arousal/Alertness: Awake/alert Behavior During Therapy: WFL for tasks assessed/performed Overall Cognitive Status: No family/caregiver  present to determine baseline cognitive functioning                                 General Comments: oriented x4 cooperative to therapy        Exercises      Shoulder Instructions       General Comments      Pertinent Vitals/ Pain       Pain Assessment Pain Assessment: No/denies pain  Home Living                                          Prior  Functioning/Environment              Frequency  Min 2X/week        Progress Toward Goals  OT Goals(current goals can now be found in the care plan section)  Progress towards OT goals: Progressing toward goals  Acute Rehab OT Goals Patient Stated Goal: go to rehab OT Goal Formulation: With patient Time For Goal Achievement: 06/19/21 Potential to Achieve Goals: Good ADL Goals Pt Will Perform Upper Body Bathing: with modified independence;sitting Pt Will Perform Lower Body Bathing: with mod assist;with adaptive equipment;sitting/lateral leans Pt Will Perform Upper Body Dressing: with modified independence;sitting Pt Will Perform Lower Body Dressing: with mod assist;with adaptive equipment;sitting/lateral leans Pt Will Transfer to Toilet: with mod assist;with +2 assist;squat pivot transfer Pt Will Perform Toileting - Clothing Manipulation and hygiene: with mod assist;sitting/lateral leans;with adaptive equipment  Plan Discharge plan remains appropriate    Co-evaluation                 AM-PAC OT "6 Clicks" Daily Activity     Outcome Measure   Help from another person eating meals?: A Little Help from another person taking care of personal grooming?: A Little Help from another person toileting, which includes using toliet, bedpan, or urinal?: Total Help from another person bathing (including washing, rinsing, drying)?: A Lot Help from another person to put on and taking off regular upper body clothing?: A Little Help from another person to put on and taking off regular lower body clothing?: Total 6 Click Score: 13    End of Session    OT Visit Diagnosis: Unsteadiness on feet (R26.81);Other abnormalities of gait and mobility (R26.89);Muscle weakness (generalized) (M62.81)   Activity Tolerance Patient tolerated treatment well   Patient Left in bed;with call bell/phone within reach;with bed alarm set   Nurse Communication Mobility status        Time:  3500-9381 OT Time Calculation (min): 42 min  Charges: OT General Charges $OT Visit: 1 Visit OT Treatments $Self Care/Home Management : 23-37 mins $Therapeutic Activity: 8-22 mins  Lodema Hong, [place]  Pager [phone] Office [phone]   Trixie Dredge 06/07/2021, 3:05 PM

## 2021-06-08 ENCOUNTER — Other Ambulatory Visit: Payer: Self-pay | Admitting: Family Medicine

## 2021-06-08 DIAGNOSIS — E531 Pyridoxine deficiency: Secondary | ICD-10-CM

## 2021-06-08 LAB — RESP PANEL BY RT-PCR (FLU A&B, COVID) ARPGX2
Influenza A by PCR: NEGATIVE
Influenza B by PCR: NEGATIVE
SARS Coronavirus 2 by RT PCR: NEGATIVE

## 2021-06-08 LAB — METHYLMALONIC ACID, SERUM: Methylmalonic Acid, Quantitative: 205 nmol/L (ref 0–378)

## 2021-06-08 MED ORDER — VITAMIN B-6 100 MG PO TABS
100.0000 mg | ORAL_TABLET | Freq: Every day | ORAL | Status: DC
  Administered 2021-06-08 – 2021-06-10 (×3): 100 mg via ORAL
  Filled 2021-06-08 (×3): qty 1

## 2021-06-08 NOTE — Progress Notes (Signed)
PROGRESS NOTE  [name] LEX:[id number] DOB: [birth year] DOA: 06/04/2021 PCP: [name], MD   LOS: 2 days   Brief Narrative / Interim history:  51 y.o. female with medical history significant of obesity, class III, depression/anxiety, hypertension, hyperlipidemia, comes into the hospital with progressive lower extremity weakness.  Patient noted that she has had increased weakness in her legs since last Thanksgiving.  Neurology consulted.  MRI of the C-spine concerning for compressive myelomalacia and neurosurgery consulted.  Subjective / 24h Interval events: Continues to feel stronger.  Assessment & Plan: Compressive cervical cord myelomalacia ([place]) -Patient presented to the hospital with significant progressive lower extremity weakness to the point that she cannot walk.  MRI of the C-spine showed moderate to severe diffuse spinal stenosis C3-4 through C6-7, also C3-4 cord signal abnormalities suspicious for is compressive myelomalacia.  She will eventually need 3 level ACDF at C3-7, but not urgent. Outpatient follow up with Dr Christella Noa.  PT initially recommending SNF, however patient had difficulties choosing a bed, and today stating that she wants to go home.  She will need to have the DMEs arranged prior to discharge, including hospital bed.  Hopefully will get everything in place by Wednesday  Anxiety and depression- (present on admission) - Continue home medications  Essential hypertension - Continue home medications  Hyperlipidemia- (present on admission) - Continue home statin  B12 deficiency - B12 on the lower side of normal, given neurodeficits replete IV.  She used to be on IV B12, but stopped because she was told levels normalized.  Continue repletion  Vitamin B6 deficiency -replete  Vitamin D deficiency -Continue to replete  Folate deficiency -Replete folate levels  Leg weakness, bilateral- (present on admission) - Supposed to go to SNF but now wants to go  home instead  Obesity, Class III, BMI 40-49.9 (morbid obesity) ([place]) - Patient would benefit from weight loss    Scheduled Meds:  ARIPiprazole  10 mg Oral q AM   buPROPion  300 mg Oral Daily   busPIRone  30 mg Oral BID   cyanocobalamin  1,000 mcg Intramuscular Daily   DULoxetine  60 mg Oral BID   enoxaparin (LOVENOX) injection  0.5 mg/kg Subcutaneous Q24H   ferrous sulfate  325 mg Oral Q breakfast   folic acid  1 mg Oral Daily   furosemide  40 mg Oral Daily   gabapentin  1,200 mg Oral QHS   gabapentin  600 mg Oral BID   nebivolol  10 mg Oral Daily   Oxcarbazepine  300 mg Oral BID   pantoprazole  40 mg Oral BID AC   pentoxifylline  400 mg Oral TID WC   vitamin B-6  100 mg Oral Daily   rosuvastatin  20 mg Oral Daily   thiamine  100 mg Oral Daily   Vitamin D (Ergocalciferol)  50,000 Units Oral Daily   Continuous Infusions:  promethazine (PHENERGAN) injection (IM or IVPB) Stopped (06/06/21 2036)   PRN Meds:.acetaminophen **OR** acetaminophen, ALPRAZolam, promethazine (PHENERGAN) injection (IM or IVPB)  Diet Orders (From admission, onward)     Start     Ordered   06/04/21 1822  Diet regular Room service appropriate? Yes; Fluid consistency: Thin  Diet effective now       Question Answer Comment  Room service appropriate? Yes   Fluid consistency: Thin      06/04/21 1821            DVT prophylaxis:    Lab Results  Component Value Date  PLT 281 06/07/2021      Code Status: Full Code  Family Communication: No family at bedside  Status is: Inpatient  Level of care: Med-Surg  Consultants:  Neurology Neurosurgery  Procedures:  none  Microbiology  none  Antimicrobials: none    Objective: Vitals:   06/07/21 2005 06/07/21 2341 06/08/21 0335 06/08/21 1112  BP: 114/64 115/64 121/69 111/69  Pulse: 63 (!) 59 (!) 56 70  Resp: 17 17 16 16   Temp: 98.2 F (36.8 C) 98.2 F (36.8 C) 97.9 F (36.6 C) 98.4 F (36.9 C)  TempSrc: Oral Oral Oral Oral   SpO2: 100% 97% 96% 95%  Weight:      Height:        Intake/Output Summary (Last 24 hours) at 06/08/2021 1345 Last data filed at 06/08/2021 0500 Gross per 24 hour  Intake 480 ml  Output 900 ml  Net -420 ml    Wt Readings from Last 3 Encounters:  06/04/21 128 kg  04/27/21 127.9 kg  02/10/21 (!) 141.3 kg    Examination:  Constitutional: NAD Eyes: lids and conjunctivae normal, no scleral icterus ENMT: mmm Neck: normal, supple Respiratory: clear to auscultation bilaterally, no wheezing, no crackles. Normal respiratory effort.  Cardiovascular: Regular rate and rhythm, no murmurs / rubs / gallops. No LE edema. Abdomen: soft, no distention, no tenderness. Bowel sounds positive.  Skin: no rashes Neurologic: no focal deficits, equal strength   Data Reviewed: I have independently reviewed following labs and imaging studies  CBC Recent Labs  Lab 06/04/21 1202 06/07/21 0332  WBC 5.7 6.8  HGB 13.0 12.8  HCT 42.5 40.4  PLT 278 281  MCV 91.6 87.6  MCH 28.0 27.8  MCHC 30.6 31.7  RDW 14.7 14.7  LYMPHSABS 1.3  --   MONOABS 0.4  --   EOSABS 0.5  --   BASOSABS 0.0  --      Recent Labs  Lab 06/04/21 1202 06/04/21 1739 06/07/21 0332  NA 135  --  135  K 4.1  --  3.5  CL 103  --  100  CO2 25  --  24  GLUCOSE 90  --  80  BUN 8  --  10  CREATININE 0.50  --  0.52  CALCIUM 8.8*  --  8.4*  AST 13*  --  11*  ALT 13  --  12  ALKPHOS 106  --  99  BILITOT 0.2*  --  0.2*  ALBUMIN 2.8*  --  2.7*  MG 1.8  --   --   TSH  --  2.100  --   HGBA1C  --  5.1  --     ------------------------------------------------------------------------------------------------------------------ No results for input(s): CHOL, HDL, LDLCALC, TRIG, CHOLHDL, LDLDIRECT in the last 72 hours.  Lab Results  Component Value Date   HGBA1C 5.1 06/04/2021   ------------------------------------------------------------------------------------------------------------------ No results for input(s): TSH,  T4TOTAL, T3FREE, THYROIDAB in the last 72 hours.  Invalid input(s): FREET3   Cardiac Enzymes No results for input(s): CKMB, TROPONINI, MYOGLOBIN in the last 168 hours.  Invalid input(s): CK ------------------------------------------------------------------------------------------------------------------ No results found for: BNP  CBG: No results for input(s): GLUCAP in the last 168 hours.  Recent Results (from the past 240 hour(s))  Resp Panel by RT-PCR (Flu A&B, Covid) Nasopharyngeal Swab     Status: None   Collection Time: 06/04/21 12:08 PM   Specimen: Nasopharyngeal Swab; Nasopharyngeal(NP) swabs in vial transport medium  Result Value Ref Range Status   SARS Coronavirus 2 by RT PCR  NEGATIVE NEGATIVE Final    Comment: (NOTE) SARS-CoV-2 target nucleic acids are NOT DETECTED.  The SARS-CoV-2 RNA is generally detectable in upper respiratory specimens during the acute phase of infection. The lowest concentration of SARS-CoV-2 viral copies this assay can detect is 138 copies/mL. A negative result does not preclude SARS-Cov-2 infection and should not be used as the sole basis for treatment or other patient management decisions. A negative result may occur with  improper specimen collection/handling, submission of specimen other than nasopharyngeal swab, presence of viral mutation(s) within the areas targeted by this assay, and inadequate number of viral copies(<138 copies/mL). A negative result must be combined with clinical observations, patient history, and epidemiological information. The expected result is Negative.  Fact Sheet for Patients:  EntrepreneurPulse.com.au  Fact Sheet for Healthcare Providers:  IncredibleEmployment.be  This test is no t yet approved or cleared by the [place] FDA and  has been authorized for detection and/or diagnosis of SARS-CoV-2 by FDA under an Emergency Use Authorization (EUA). This EUA will remain   in effect (meaning this test can be used) for the duration of the COVID-19 declaration under Section 564(b)(1) of the Act, 21 U.S.C.section 360bbb-3(b)(1), unless the authorization is terminated  or revoked sooner.       Influenza A by PCR NEGATIVE NEGATIVE Final   Influenza B by PCR NEGATIVE NEGATIVE Final    Comment: (NOTE) The Xpert Xpress SARS-CoV-2/FLU/RSV plus assay is intended as an aid in the diagnosis of influenza from Nasopharyngeal swab specimens and should not be used as a sole basis for treatment. Nasal washings and aspirates are unacceptable for Xpert Xpress SARS-CoV-2/FLU/RSV testing.  Fact Sheet for Patients: EntrepreneurPulse.com.au  Fact Sheet for Healthcare Providers: IncredibleEmployment.be  This test is not yet approved or cleared by the [place] FDA and has been authorized for detection and/or diagnosis of SARS-CoV-2 by FDA under an Emergency Use Authorization (EUA). This EUA will remain in effect (meaning this test can be used) for the duration of the COVID-19 declaration under Section 564(b)(1) of the Act, 21 U.S.C. section 360bbb-3(b)(1), unless the authorization is terminated or revoked.  Performed at [facility], [place] [address] [place], [place] [address]   Resp Panel by RT-PCR (Flu A&B, Covid) Nasopharyngeal Swab     Status: None   Collection Time: 06/08/21 11:58 AM   Specimen: Nasopharyngeal Swab; Nasopharyngeal(NP) swabs in vial transport medium  Result Value Ref Range Status   SARS Coronavirus 2 by RT PCR NEGATIVE NEGATIVE Final    Comment: (NOTE) SARS-CoV-2 target nucleic acids are NOT DETECTED.  The SARS-CoV-2 RNA is generally detectable in upper respiratory specimens during the acute phase of infection. The lowest concentration of SARS-CoV-2 viral copies this assay can detect is 138 copies/mL. A negative result does not preclude SARS-Cov-2 infection and should not be used as the sole basis  for treatment or other patient management decisions. A negative result may occur with  improper specimen collection/handling, submission of specimen other than nasopharyngeal swab, presence of viral mutation(s) within the areas targeted by this assay, and inadequate number of viral copies(<138 copies/mL). A negative result must be combined with clinical observations, patient history, and epidemiological information. The expected result is Negative.  Fact Sheet for Patients:  EntrepreneurPulse.com.au  Fact Sheet for Healthcare Providers:  IncredibleEmployment.be  This test is no t yet approved or cleared by the [place] FDA and  has been authorized for detection and/or diagnosis of SARS-CoV-2 by FDA under an Emergency Use Authorization (EUA). This  EUA will remain  in effect (meaning this test can be used) for the duration of the COVID-19 declaration under Section 564(b)(1) of the Act, 21 U.S.C.section 360bbb-3(b)(1), unless the authorization is terminated  or revoked sooner.       Influenza A by PCR NEGATIVE NEGATIVE Final   Influenza B by PCR NEGATIVE NEGATIVE Final    Comment: (NOTE) The Xpert Xpress SARS-CoV-2/FLU/RSV plus assay is intended as an aid in the diagnosis of influenza from Nasopharyngeal swab specimens and should not be used as a sole basis for treatment. Nasal washings and aspirates are unacceptable for Xpert Xpress SARS-CoV-2/FLU/RSV testing.  Fact Sheet for Patients: EntrepreneurPulse.com.au  Fact Sheet for Healthcare Providers: IncredibleEmployment.be  This test is not yet approved or cleared by the [place] FDA and has been authorized for detection and/or diagnosis of SARS-CoV-2 by FDA under an Emergency Use Authorization (EUA). This EUA will remain in effect (meaning this test can be used) for the duration of the COVID-19 declaration under Section 564(b)(1) of the Act, 21  U.S.C. section 360bbb-3(b)(1), unless the authorization is terminated or revoked.  Performed at [facility], [place] [address], [place], [place] [address]       Radiology Studies: No results found.   Marzetta Board, MD, PhD [facility]  Between 7 am - 7 pm I am available, please contact me via Amion (for emergencies) or Securechat (non urgent messages)  Between 7 pm - 7 am I am not available, please contact night coverage MD/APP via Amion

## 2021-06-08 NOTE — Assessment & Plan Note (Signed)
-  replete °

## 2021-06-08 NOTE — TOC Progression Note (Signed)
Transition of Care [facility]) - Progression Note    Patient Details  Name: [name] MRN: [id number] Date of Birth: 1970-07-14  Transition of Care [facility]) CM/SW [place], [place] Phone Number: 06/08/2021, 3:32 PM  Clinical Narrative:   CSW met with patient to discuss SNF. CSW updated patient that [facility]. Patient asked about [facility], but they are full and have no beds. Patient asked about [facility], and CSW updated that their covid case had been sent to the hospital and the situation was under control. Patient asked for PT to come back and re-evaluate as she feels better, hopeful to return home. [facility] still had a bed available today. After PT session, patient called her son and they will manage at home instead of SNF. Patient will need hoyer lift, hospital bed, and wheelchair. CSW confirmed address and that patient's son will be home. Orders placed to [employer]. CSW to follow.    Expected Discharge Plan: [place] Barriers to Discharge: SNF Pending bed offer, Continued Medical Work up, Ship broker  Expected Discharge Plan and Services Expected Discharge Plan: [place] Choice: [place] arrangements for the past 2 months: Single Family Home                                       Social Determinants of Health (SDOH) Interventions    Readmission Risk Interventions No flowsheet data found.

## 2021-06-08 NOTE — Progress Notes (Signed)
Physical Therapy Treatment Patient Details Name: [name] MRN: [id number] DOB: 02-Mar-1971 Today's Date: 06/08/2021   History of Present Illness 51 y/o female presented to ED on 06/04/21 for evaluation of worsening bilateral LE weakness over past 6 months after falls in Aug/Sept of 2022. MRI showed moderate to severe spinal stenosis C3-7 and suspected subtle patchy cord signal abnormality at level of C3-4, suspicious of compressive myelomalacia. Waiting for neurosurgical consult. PMH: obesity, HTN, sleep apnea    PT Comments    Patient progressing with strength and mobility able to attempt sit to stand with +2 mod A to [name] and then transferred to drop arm recliner with scoot/pivot with mod A of 1 (+2 for holding chair for safety).  She is eager to try to go home for HHPT and reports her son can be with her most of the time if needed.  She has a level entry home and currently has lift chair, 3:1 (not drop arm) and transport w/c.  Feel if her son can assist and be available most of the time not leaving her for more than 2 hours at a time, she could go home with hoyer lift, manual wheelchair, hosptial bed and follow up HHPT. PT will continue to follow acutely.    Recommendations for follow up therapy are one component of a multi-disciplinary discharge planning process, led by the attending physician.  Recommendations may be updated based on patient status, additional functional criteria and insurance authorization.  Follow Up Recommendations  Home health PT     Assistance Recommended at Discharge Frequent or constant Supervision/Assistance  Patient can return home with the following A lot of help with walking and/or transfers;A lot of help with bathing/dressing/bathroom;Assistance with cooking/housework;Assist for transportation   Equipment Recommendations  Hospital bed;Wheelchair cushion (measurements PT);Wheelchair (measurements PT);Other (comment) (hoyer lift)    Recommendations for Other  Services       Precautions / Restrictions Precautions Precautions: Fall     Mobility  Bed Mobility Overal bed mobility: Needs Assistance Bed Mobility: Rolling, Supine to Sit Rolling: Min assist   Supine to sit: Min guard, HOB elevated     General bed mobility comments: assist for hips when rolling in bed for hygiene and linen change as pt aware she had incontinent episode; supine to sit using rail with HOB up with assist for balance when lifting trunk    Transfers Overall transfer level: Needs assistance   Transfers: Sit to/from Stand, Bed to chair/wheelchair/BSC Sit to Stand: Mod assist, +2 physical assistance          Lateral/Scoot Transfers: Mod assist, +2 safety/equipment General transfer comment: initial sit to stand to [facility] with +2 A but unable to get hips up enough to lower flaps for sitting and transferring with device (hips wider than sara stedy); so used drop arm recliner and performed scooting/pivoting transfer with some weight on feet with knees blocked to edge of chair, then pt scooting back on chair with mod A and increased time, cues for technique.    Ambulation/Gait               General Gait Details: unable   Stairs             Wheelchair Mobility    Modified Rankin (Stroke Patients Only)       Balance Overall balance assessment: Needs assistance Sitting-balance support: Feet supported Sitting balance-Leahy Scale: Fair     Standing balance support: Bilateral upper extremity supported Standing balance-Leahy Scale: Zero Standing balance comment:  stood briefly mod A of 2 to [place], but unable to get hips extended                            Cognition Arousal/Alertness: Awake/alert Behavior During Therapy: WFL for tasks assessed/performed Overall Cognitive Status: Within Functional Limits for tasks assessed                                          Exercises      General Comments General comments  (skin integrity, edema, etc.): noted several areas on bottom with dressings, discussed/educated pt on importance of having help during the day (i.e. no more than 1-2 hours at home without assistance and need for help for repositioning and changing when soiled for skin protection)      Pertinent Vitals/Pain Pain Assessment Pain Assessment: No/denies pain    Home Living                          Prior Function            PT Goals (current goals can now be found in the care plan section) Progress towards PT goals: Progressing toward goals    Frequency    Min 3X/week      PT Plan Discharge plan needs to be updated    Co-evaluation              AM-PAC PT "6 Clicks" Mobility   Outcome Measure  Help needed turning from your back to your side while in a flat bed without using bedrails?: A Lot Help needed moving from lying on your back to sitting on the side of a flat bed without using bedrails?: A Lot Help needed moving to and from a bed to a chair (including a wheelchair)?: A Lot Help needed standing up from a chair using your arms (e.g., wheelchair or bedside chair)?: Total Help needed to walk in hospital room?: Total Help needed climbing 3-5 steps with a railing? : Total 6 Click Score: 9    End of Session Equipment Utilized During Treatment: Gait belt Activity Tolerance: Patient tolerated treatment well Patient left: in chair;with call bell/phone within reach;with chair alarm set Nurse Communication: Mobility status PT Visit Diagnosis: Other abnormalities of gait and mobility (R26.89);Muscle weakness (generalized) (M62.81)     Time: 6962-9528 PT Time Calculation (min) (ACUTE ONLY): 24 min  Charges:  $Therapeutic Activity: 23-37 mins                     Magda Kiel, PT Acute Rehabilitation Services UXLKG:[phone] Office:[phone] 06/08/2021    Reginia Naas 06/08/2021, 1:25 PM

## 2021-06-08 NOTE — TOC Progression Note (Signed)
Transition of Care [facility]) - Progression Note    Patient Details  Name: GILLIAN MEEUWSEN MRN: [id number] Date of Birth: [birth year]  Transition [facility]) CM/SW [place], [place] Phone Number: 06/08/2021, 3:31 PM  Clinical Narrative:   CSW met with patient periodically throughout the day to discuss SNF options. CSW provided bed offers, discussed how [facility] had a positive covid case this morning so may not be able to offer a bed for the patient. Patient said she wanted [facility]. CSW contacted [facility], they are unable to offer a bed. CSW updated patient, she asked for time to call her friend who was an EMT. CSW met with patient later, she still hadn't spoken with her friend yet, but asked about [facility]. CSW contacted [facility], they are also unable to offer a bed. CSW to follow.    Expected Discharge Plan: [place] Barriers to Discharge: SNF Pending bed offer, Continued Medical Work up, Ship broker  Expected Discharge Plan and Services Expected Discharge Plan: [place] Choice: [place] arrangements for the past 2 months: Single Family Home                                       Social Determinants of Health (SDOH) Interventions    Readmission Risk Interventions No flowsheet data found.

## 2021-06-08 NOTE — Plan of Care (Signed)
°  Problem: Education: Goal: Knowledge of General Education information will improve Description: Including pain rating scale, medication(s)/side effects and non-pharmacologic comfort measures Outcome: Progressing   Problem: Health Behavior/Discharge Planning: Goal: Ability to manage health-related needs will improve Outcome: Progressing   Problem: Clinical Measurements: Goal: Ability to maintain clinical measurements within normal limits will improve Outcome: Progressing   Problem: Activity: Goal: Risk for activity intolerance will decrease Outcome: Progressing   Problem: Elimination: Goal: Will not experience complications related to bowel motility Outcome: Progressing   Problem: Safety: Goal: Ability to remain free from injury will improve Outcome: Progressing   Problem: Skin Integrity: Goal: Risk for impaired skin integrity will decrease Outcome: Progressing   

## 2021-06-08 NOTE — Progress Notes (Cosign Needed Addendum)
Wheelchair:  Patient suffers from myelomalacia of cervical cord which impairs their ability to perform daily activities like bathing, dressing, grooming, and toileting in the home.  A walker will not resolve issue with performing activities of daily living. A wheelchair will allow patient to safely perform daily activities. Patient can safely propel the wheelchair in the home or has a caregiver who can provide assistance. Length of need Lifetime.  Accessories: elevating leg rests (ELRs), wheel locks, extensions and anti-tippers.  Pt needs heavy duty/ bariatric size  Due to body habitus the patient requires a bariatric/ heavy duty wheelchair. She will not be comfortable or safe with a regular size wheelchair.   Hospital bed:  Length of Need 6 Months  The above medical condition requires: Patient requires the ability to reposition frequently  Head must be elevated greater than: 30 degrees  Bed type Semi-electric  Hoyer Lift Yes  Support Surface: Gel Overlay

## 2021-06-09 LAB — VITAMIN E
Vitamin E (Alpha Tocopherol): 8.9 mg/L (ref 7.0–25.1)
Vitamin E(Gamma Tocopherol): 1.2 mg/L (ref 0.5–5.5)

## 2021-06-09 MED ORDER — PYRIDOXINE HCL 100 MG PO TABS: 100.0000 mg | ORAL_TABLET | Freq: Every day | ORAL | 2 refills | Status: DC

## 2021-06-09 MED ORDER — CIPROFLOXACIN HCL 500 MG PO TABS
500.0000 mg | ORAL_TABLET | Freq: Two times a day (BID) | ORAL | Status: DC
  Administered 2021-06-09 – 2021-06-10 (×3): 500 mg via ORAL
  Filled 2021-06-09 (×3): qty 1

## 2021-06-09 MED ORDER — CYCLOBENZAPRINE HCL 10 MG PO TABS
5.0000 mg | ORAL_TABLET | Freq: Three times a day (TID) | ORAL | Status: DC | PRN
  Administered 2021-06-09 – 2021-06-10 (×2): 5 mg via ORAL
  Filled 2021-06-09 (×2): qty 1

## 2021-06-09 MED ORDER — THIAMINE HCL 100 MG PO TABS: 100.0000 mg | ORAL_TABLET | Freq: Every day | ORAL | 2 refills | Status: DC

## 2021-06-09 MED ORDER — CIPROFLOXACIN HCL 500 MG PO TABS: 500.0000 mg | ORAL_TABLET | Freq: Two times a day (BID) | ORAL | 0 refills | Status: DC

## 2021-06-09 MED ORDER — FOLIC ACID 1 MG PO TABS
1.0000 mg | ORAL_TABLET | Freq: Every day | ORAL | 2 refills | Status: DC
Start: 2021-06-10 — End: 2021-08-18

## 2021-06-09 MED ORDER — VITAMIN B-12 1000 MCG PO TABS
1000.0000 ug | ORAL_TABLET | Freq: Every day | ORAL | 2 refills | Status: DC
Start: 2021-06-09 — End: 2021-08-18

## 2021-06-09 NOTE — Care Management Important Message (Signed)
Important Message  Patient Details  Name: [name] MRN: [id number] Date of Birth: 1970/08/09   Medicare Important Message Given:  Yes     Hannah Beat 06/09/2021, 12:11 PM

## 2021-06-09 NOTE — Discharge Summary (Signed)
Physician Discharge Summary  [name]:[id number] DOB: January 06, 1971 DOA: 06/04/2021  PCP: [name], MD  Admit date: 06/04/2021 Discharge date: 06/09/2021  Admitted From: Home Discharge disposition: Home with PT   Code Status: Full Code  Diet Recommendation: Cardiac diet  Discharge Diagnosis:   Principal Problem:   Myelomalacia of cervical cord ([place]) Active Problems:   Compressive cervical cord myelomalacia ([place])   B12 deficiency   Folate deficiency   Obesity, Class III, BMI 40-49.9 (morbid obesity) ([place])   Vitamin D deficiency   Hyperlipidemia   Anxiety and depression   Essential hypertension   Leg weakness, bilateral   Vitamin B6 deficiency   History of Present Illness / Brief narrative:  Patient is a 51 y.o. female with medical history significant of obesity s/p bariatric surgery in 2014, depression/anxiety, hypertension, hyperlipidemia. Patient presented to the ED on 06/04/2021 with complaint of progressive lower extremity weakness for several weeks since she had a fall from her Rollator in November. Lives with son at home. EMS noted that patient was covered in her own feces and urine.  She had possibility of multiple bedsores.  MRI of the C-spine was concerning for compressive myelomalacia. Admitted to hospitalist service.   Neurology neurosurgery was consulted  Subjective:  Seen and examined this morning.  Pleasant middle-aged Caucasian female.  Propped up in bed.  Not in distress.  No new symptoms.  Feels ready to go home.  Hospital Course:  Compressive cervical cord myelomalacia -Patient presented to the hospital with significant progressive lower extremity weakness to the point that she could not walk. - MRI of the C-spine showed moderate to severe diffuse spinal stenosis C3-4 through C6-7, also C3-4 cord signal abnormalities suspicious for compressive myelomalacia.  Neurosurgery was consulted.  Per neurosurgery, she will eventually need 3 level  ACDF at C3-7, but not urgent.  Recommended outpatient follow up with Dr Christella Noa.   -PT consult was obtained.  SNF was recommended but patient made a choice to go home.  DME arrangements made.   Multiple vitamins deficiencies -Patient was found to have low level of vitamin B12, vitamin B6, vitamin D and folate.  She has a history of bariatric surgery in 2014 and is not on multivitamin supplements probably leading to deficiencies. -Vitamin D2 level 30, vitamin B6 level 1.6 (3.4-65), vitamin B12 level 264. -She has been started on replacements.  Continue to follow-up levels with PCP.  UTI -Patient endorses foul-smelling urine. -Urinalysis on 1/22 was positive for nitrite and leukocytes.  Started on 3-day course of ciprofloxacin.  Anxiety and depression -Continue home medications   Essential hypertension -Continue home medications   Hyperlipidemia -Continue home statin   Morbid obesity  -Body mass index is 50.79 kg/m. Patient has been advised to make an attempt to improve diet and exercise patterns to aid in weight loss.  Impaired mobility -SNF recommended by PT but patient wants to go home rather than DME arranged.  Pressure ulcers -Present on admission.  As described below.    Discharge Medications:   Allergies as of 06/09/2021       Reactions   Niacin Anaphylaxis, Shortness Of Breath, Swelling   Swelling and "problems breathing"   Sulfamethoxazole-trimethoprim Anaphylaxis, Swelling, Rash, Other (See Comments)   Throat closed and the eyes became swollen   Aspirin Hives   Bactrim Hives   Benzoin Compound Rash   Cephalexin Hives   Tolerated ceftriaxone and cefepime 07/2019   Clindamycin/lincomycin Hives   Iohexol Hives   Pt treated  with PO benedryl   Lisinopril Hives   Naproxen Hives   Pnu-imune [pneumococcal Polysaccharide Vaccine] Rash   Sulfonamide Derivatives Rash        Medication List     STOP taking these medications    clobetasol cream 0.05 % Commonly  known as: TEMOVATE   doxycycline 100 MG tablet Commonly known as: VIBRA-TABS   fluconazole 100 MG tablet Commonly known as: Diflucan   HYDROcodone-acetaminophen 10-325 MG tablet Commonly known as: NORCO   irbesartan 75 MG tablet Commonly known as: AVAPRO   meclizine 25 MG tablet Commonly known as: ANTIVERT   mupirocin ointment 2 % Commonly known as: BACTROBAN   nystatin powder Commonly known as: MYCOSTATIN/NYSTOP       TAKE these medications    ALPRAZolam 0.5 MG tablet Commonly known as: XANAX TAKE 1 TABLET BY MOUTH 2 TIMES DAILY AS NEEDED FOR ANXIETY What changed: when to take this   ARIPiprazole 10 MG tablet Commonly known as: ABILIFY Take 10 mg by mouth in the morning.   Armodafinil 250 MG tablet Take 250 mg by mouth 3 (three) times daily. 8am, 2pm, 6pm   Biotin 10 MG Tabs Take 10 mg by mouth in the morning.   buPROPion 300 MG 24 hr tablet Commonly known as: WELLBUTRIN XL TAKE 1 TABLET BY MOUTH EVERY DAY What changed: how to take this   busPIRone 30 MG tablet Commonly known as: BUSPAR TAKE 1 TABLET BY MOUTH 2 TIMES DAILY   calcium citrate 950 (200 Ca) MG tablet Commonly known as: CALCITRATE - dosed in mg elemental calcium Take 1 tablet by mouth daily.   ciprofloxacin 500 MG tablet Commonly known as: CIPRO Take 1 tablet (500 mg total) by mouth 2 (two) times daily for 3 days.   clonazePAM 1 MG tablet Commonly known as: KLONOPIN TAKE 1 TABLET BY MOUTH AT BEDTIME What changed: when to take this   collagenase ointment Commonly known as: SANTYL Apply topically daily. apply Santyl to scabs on legs What changed: how much to take   cyclobenzaprine 10 MG tablet Commonly known as: FLEXERIL TAKE 1 TABLET BY MOUTH 3 TIMES DAILY AS NEEDED What changed: when to take this   diclofenac Sodium 1 % Gel Commonly known as: VOLTAREN Apply 4 g topically 4 (four) times daily.   diphenhydrAMINE 25 MG tablet Commonly known as: BENADRYL Take 25 mg by mouth  every 6 (six) hours as needed for allergies.   DULoxetine 60 MG capsule Commonly known as: CYMBALTA Take 2 capsules (120 mg total) by mouth daily. What changed:  how much to take when to take this   FeroSul 325 (65 FE) MG tablet Generic drug: ferrous sulfate TAKE 1 TABLET BY MOUTH EVERY DAY WITH BREAKFAST What changed: See the new instructions.   folic acid 1 MG tablet Commonly known as: FOLVITE Take 1 tablet (1 mg total) by mouth daily. Start taking on: June 10, 2021   furosemide 40 MG tablet Commonly known as: LASIX TAKE 1 TABLET BY MOUTH EVERY DAY MAY take additional TABLET IF weight INCREASE by 5lb AND call MD FOR further instructions What changed: See the new instructions.   gabapentin 600 MG tablet Commonly known as: NEURONTIN Take 1 tablet (600 mg total) by mouth 3 (three) times daily. TAKE 2 TABLETS BY MOUTH 3 TIMES DAILY What changed:  how much to take additional instructions   nebivolol 10 MG tablet Commonly known as: BYSTOLIC TAKE 1 TABLET BY MOUTH EVERY DAY   Oxcarbazepine 300 MG tablet  Commonly known as: TRILEPTAL Take 300 mg by mouth 2 (two) times daily.   pantoprazole 40 MG tablet Commonly known as: PROTONIX TAKE 1 TABLET BY MOUTH 2 TIMES DAILY What changed:  how to take this when to take this   pentoxifylline 400 MG CR tablet Commonly known as: TRENTAL Take 400 mg by mouth 3 (three) times daily with meals.   phentermine 37.5 MG capsule TAKE 1 CAPSULE BY MOUTH EVERY MORNING What changed: when to take this   promethazine 25 MG tablet Commonly known as: PHENERGAN TAKE 1 TABLET BY MOUTH EVERY 6 HOURS AS NEEDED FOR NAUSEA AND VOMITING What changed: See the new instructions.   pyridoxine 100 MG tablet Commonly known as: B-6 Take 1 tablet (100 mg total) by mouth daily. Start taking on: June 10, 2021   rosuvastatin 20 MG tablet Commonly known as: CRESTOR TAKE 1 TABLET BY MOUTH EVERY DAY   senna-docusate 8.6-50 MG tablet Commonly known  as: Senokot-S Take 1 tablet by mouth at bedtime. What changed:  when to take this reasons to take this   Skyrizi 150 MG/ML Sosy Generic drug: Risankizumab-rzaa Inject 150 mg into the skin every 3 (three) months.   SUMAtriptan 100 MG tablet Commonly known as: IMITREX TAKE 1 TABLET BY MOUTH AT migraine ONSET, MAY REPEAT in 2 hours IF HEADACHE persists OR recurs What changed: See the new instructions.   thiamine 100 MG tablet Take 1 tablet (100 mg total) by mouth daily. Start taking on: June 10, 2021   vitamin B-12 1000 MCG tablet Commonly known as: CYANOCOBALAMIN Take 1 tablet (1,000 mcg total) by mouth daily.               Durable Medical Equipment  (From admission, onward)           Start     Ordered   06/08/21 1334  For home use only DME standard manual wheelchair with seat cushion  Once       Comments: Patient suffers from myelomalacia of cervical cord which impairs their ability to perform daily activities like bathing, dressing, grooming, and toileting in the home.  A walker will not resolve issue with performing activities of daily living. A wheelchair will allow patient to safely perform daily activities. Patient can safely propel the wheelchair in the home or has a caregiver who can provide assistance. Length of need Lifetime. Accessories: elevating leg rests (ELRs), wheel locks, extensions and anti-tippers. Pt needs heavy duty/ bariatric size   06/08/21 1334   06/08/21 1329  For home use only DME wheelchair cushion (seat and back)  Once        06/08/21 1328   06/08/21 1326  For home use only DME Hospital bed  Once       Question Answer Comment  Length of Need 6 Months   The above medical condition requires: Patient requires the ability to reposition frequently   Head must be elevated greater than: 30 degrees   Bed type Semi-electric   Hoyer Lift Yes   Support Surface: Gel Overlay      06/08/21 1328              Discharge Care Instructions   (From admission, onward)           Start     Ordered   06/09/21 0000  Discharge wound care:        06/09/21 1015            Wound care:   Wound /  Incision (Open or Dehisced) 08/01/19 Other (Comment) Leg Left (Active)  Date First Assessed/Time First Assessed: 08/01/19 0600   Wound Type: Other (Comment)  Location: Leg  Location Orientation: Left  Present on Admission: Yes    Assessments 08/01/2019  5:00 AM 08/02/2019  8:45 AM  Dressing Type Gauze (Comment);Compression wrap;Moist to dry Gauze (Comment);Compression wrap;Moist to dry  Dressing Changed New Other (Comment)  Dressing Status Clean;Dry;Intact Clean;Intact;Dry  Dressing Change Frequency Twice a day Twice a day  Site / Wound Assessment -- Dressing in place / Unable to assess  Drainage Amount Minimal None  Drainage Description Serosanguineous --  Treatment Cleansed Other (Comment)     No Linked orders to display     Wound / Incision (Open or Dehisced) 06/04/21 Thigh Posterior;Right stage 2 ulcer, 2cm x 1.5cm,  foam dressing appled (Active)  Date First Assessed/Time First Assessed: 06/04/21 1820   Location: Thigh  Location Orientation: Posterior;Right  Wound Description (Comments): stage 2 ulcer, 2cm x 1.5cm,  foam dressing appled  Present on Admission: Yes    Assessments 06/05/2021  8:00 PM 06/08/2021  8:00 PM  Dressing Type Foam - Lift dressing to assess site every shift Foam - Lift dressing to assess site every shift  Dressing Status Clean;Dry;Intact Clean;Dry  Dressing Change Frequency PRN PRN  Site / Wound Assessment Red;Pink;Painful --  Margins Unattached edges (unapproximated) --  Closure None --  Treatment Other (Comment) --     No Linked orders to display     Wound / Incision (Open or Dehisced) 06/04/21 (MASD) Moisture Associated Skin Damage;Skin tear Thigh Left;Posterior stage 2,  0.5cm x 5cm (Active)  Date First Assessed/Time First Assessed: 06/04/21 1830   Wound Type: (MASD) Moisture Associated Skin  Damage;Skin tear  Location: Thigh  Location Orientation: Left;Posterior  Wound Description (Comments): stage 2,  0.5cm x 5cm  Present on Admission: Yes    Assessments 06/05/2021  8:00 PM 06/08/2021  4:31 PM  Dressing Type Foam - Lift dressing to assess site every shift Foam - Lift dressing to assess site every shift  Dressing Changed -- Changed  Dressing Status Clean;Dry;Intact --  Dressing Change Frequency PRN --  Site / Wound Assessment Red;Pink --  Margins Unattached edges (unapproximated) --  Closure None --  Treatment Other (Comment) --     No Linked orders to display     Wound / Incision (Open or Dehisced) 06/04/21 (MASD) Moisture Associated Skin Damage Hip Right Multiple skin tear stage 2 (Active)  Date First Assessed/Time First Assessed: 06/04/21 1820   Wound Type: (MASD) Moisture Associated Skin Damage  Location: Hip  Location Orientation: Right  Wound Description (Comments): Multiple skin tear stage 2  Present on Admission: Yes    Assessments 06/05/2021  8:00 PM 06/08/2021  4:31 PM  Dressing Type Foam - Lift dressing to assess site every shift Foam - Lift dressing to assess site every shift  Dressing Changed -- Changed  Dressing Status Clean;Dry;Intact --  Dressing Change Frequency PRN --  Margins Unattached edges (unapproximated) --  Closure None --  Treatment Other (Comment) --     No Linked orders to display     Wound / Incision (Open or Dehisced) 06/04/21 Skin tear;(MASD) Moisture Associated Skin Damage Hip Right multiole stage 2 ulcer, foam dressing applied (Active)  Date First Assessed/Time First Assessed: 06/04/21 1830   Wound Type: Skin tear;(MASD) Moisture Associated Skin Damage  Location: Hip  Location Orientation: Right  Wound Description (Comments): multiole stage 2 ulcer, foam dressing applied  Present  on ...    Assessments 06/05/2021  8:00 PM 06/08/2021  4:31 PM  Dressing Type Foam - Lift dressing to assess site every shift Foam - Lift dressing to assess site every  shift  Dressing Changed -- Changed  Dressing Status Clean;Dry;Intact --  Dressing Change Frequency PRN --  Site / Wound Assessment Red;Pink;Painful --  Margins Unattached edges (unapproximated) --  Closure None --  Treatment Other (Comment) --     No Linked orders to display    Discharge Instructions:   Discharge Instructions     Call MD for:  difficulty breathing, headache or visual disturbances   Complete by: As directed    Call MD for:  extreme fatigue   Complete by: As directed    Call MD for:  hives   Complete by: As directed    Call MD for:  persistant dizziness or light-headedness   Complete by: As directed    Call MD for:  persistant nausea and vomiting   Complete by: As directed    Call MD for:  severe uncontrolled pain   Complete by: As directed    Call MD for:  temperature >100.4   Complete by: As directed    Diet - low sodium heart healthy   Complete by: As directed    Discharge instructions   Complete by: As directed    General discharge instructions:  Follow with Primary MD [name], MD in 7 days   Get CBC/BMP checked in next visit within 1 week by PCP or SNF MD. (We routinely change or add medications that can affect your baseline labs and fluid status, therefore we recommend that you get the mentioned basic workup next visit with your PCP, your PCP may decide not to get them or add new tests based on their clinical decision)  On your next visit with your PCP, please get your medicines reviewed and adjusted.  Please request your PCP  to go over all hospital tests, procedures, radiology results at the follow up, please get all Hospital records sent to your PCP by signing hospital release before you go home.  Activity: As tolerated with Full fall precautions use walker/cane & assistance as needed  Avoid using any recreational substances like cigarette, tobacco, alcohol, or non-prescribed drug.  If you experience worsening of your admission  symptoms, develop shortness of breath, life threatening emergency, suicidal or homicidal thoughts you must seek medical attention immediately by calling 911 or calling your MD immediately  if symptoms less severe.  You must read complete instructions/literature along with all the possible adverse reactions/side effects for all the medicines you take and that have been prescribed to you. Take any new medicine only after you have completely understood and accepted all the possible adverse reactions/side effects.   Do not drive, operate heavy machinery, perform activities at heights, swimming or participation in water activities or provide baby sitting services if your were admitted for syncope or siezures until you have seen by Primary MD or a Neurologist and advised to do so again.  Do not drive when taking Pain medications.  Do not take more than prescribed Pain, Sleep and Anxiety Medications  Wear Seat belts while driving.  Please note You were cared for by a hospitalist during your hospital stay. If you have any questions about your discharge medications or the care you received while you were in the hospital after you are discharged, you can call the unit and asked to speak with the hospitalist on  call if the hospitalist that took care of you is not available. Once you are discharged, your primary care physician will handle any further medical issues. Please note that NO REFILLS for any discharge medications will be authorized once you are discharged, as it is imperative that you return to your primary care physician (or establish a relationship with a primary care physician if you do not have one) for your aftercare needs so that they can reassess your need for medications and monitor your lab values.   Discharge wound care:   Complete by: As directed    Increase activity slowly   Complete by: As directed        Follow ups:    Follow-up Information     Ashok Pall, MD Follow up in 2  week(s).   Specialty: Neurosurgery Why: please call for a follow up appointment in 2-3 weeks Contact information: [address] [place] [address] [phone]         [name], MD Follow up.   Specialty: Family Medicine Contact information: [address] [place] [place] [address] [phone]                 Discharge Exam:   Vitals:   06/08/21 1925 06/08/21 2332 06/09/21 0326 06/09/21 0720  BP: 115/63 111/65 108/68 109/72  Pulse: 72 68 66 78  Resp: 18 18 18 16   Temp: 98.6 F (37 C) 97.6 F (36.4 C) 98.2 F (36.8 C) 98.2 F (36.8 C)  TempSrc: Oral Oral Oral Oral  SpO2: 94% 94% 94% 96%  Weight:      Height:        Body mass index is 50.79 kg/m.  General exam: Pleasant middle-aged female.  Not in physical distress Skin: No rashes, lesions or ulcers. HEENT: Atraumatic, normocephalic, no obvious bleeding Lungs: Clear to auscultation bilaterally CVS: Regular rate and rhythm, no murmur GI/Abd soft, nontender, nondistended, bowel sound present CNS: Alert, awake, oriented x3 Psychiatry: Mood appropriate Extremities: No pedal edema, no calf tenderness  Time coordinating discharge: 35 minutes   The results of significant diagnostics from this hospitalization (including imaging, microbiology, ancillary and laboratory) are listed below for reference.    Procedures and Diagnostic Studies:   CT HEAD WO CONTRAST (5MM)  Result Date: 06/04/2021 CLINICAL DATA:  Head and neck trauma from November. Weakness and focal neuro deficit. EXAM: CT HEAD WITHOUT CONTRAST CT CERVICAL SPINE WITHOUT CONTRAST TECHNIQUE: Multidetector CT imaging of the head and cervical spine was performed following the standard protocol without intravenous contrast. Multiplanar CT image reconstructions of the cervical spine were also generated. RADIATION DOSE REDUCTION: This exam was performed according to the departmental dose-optimization program which includes  automated exposure control, adjustment of the mA and/or kV according to patient size and/or use of iterative reconstruction technique. COMPARISON:  None. FINDINGS: CT HEAD FINDINGS Brain: No evidence of acute infarction, hemorrhage, hydrocephalus, extra-axial collection or mass lesion/mass effect. There is minimal chronic diffuse atrophy. Vascular: No hyperdense vessel is noted. Skull: Normal. Negative for fracture or focal lesion. Sinuses/Orbits: No acute finding. Other: None. CT CERVICAL SPINE FINDINGS Alignment: There is straightening of cervical spine either due to positioning or muscle spasm. Skull base and vertebrae: No acute fracture. No primary bone lesion or focal pathologic process. Soft tissues and spinal canal: No prevertebral fluid or swelling. No visible canal hematoma. Disc levels: Moderate degenerative joint changes are identified in the mid to lower cervical spine with narrowed joint space and  osteophyte formation. Upper chest: Mild patchy hazy opacity of the right apex is identified, nonspecific. Other: None. IMPRESSION: 1. No acute intracranial abnormality identified. 2. No acute fracture or subluxation of the cervical spine. 3. Moderate degenerative joint changes of the mid to lower cervical spine. 4. Mild patchy hazy opacity of the right apex, nonspecific. Electronically Signed   By: Abelardo Diesel M.D.   On: 06/04/2021 12:47   CT Cervical Spine Wo Contrast  Result Date: 06/04/2021 CLINICAL DATA:  Head and neck trauma from November. Weakness and focal neuro deficit. EXAM: CT HEAD WITHOUT CONTRAST CT CERVICAL SPINE WITHOUT CONTRAST TECHNIQUE: Multidetector CT imaging of the head and cervical spine was performed following the standard protocol without intravenous contrast. Multiplanar CT image reconstructions of the cervical spine were also generated. RADIATION DOSE REDUCTION: This exam was performed according to the departmental dose-optimization program which includes automated exposure  control, adjustment of the mA and/or kV according to patient size and/or use of iterative reconstruction technique. COMPARISON:  None. FINDINGS: CT HEAD FINDINGS Brain: No evidence of acute infarction, hemorrhage, hydrocephalus, extra-axial collection or mass lesion/mass effect. There is minimal chronic diffuse atrophy. Vascular: No hyperdense vessel is noted. Skull: Normal. Negative for fracture or focal lesion. Sinuses/Orbits: No acute finding. Other: None. CT CERVICAL SPINE FINDINGS Alignment: There is straightening of cervical spine either due to positioning or muscle spasm. Skull base and vertebrae: No acute fracture. No primary bone lesion or focal pathologic process. Soft tissues and spinal canal: No prevertebral fluid or swelling. No visible canal hematoma. Disc levels: Moderate degenerative joint changes are identified in the mid to lower cervical spine with narrowed joint space and osteophyte formation. Upper chest: Mild patchy hazy opacity of the right apex is identified, nonspecific. Other: None. IMPRESSION: 1. No acute intracranial abnormality identified. 2. No acute fracture or subluxation of the cervical spine. 3. Moderate degenerative joint changes of the mid to lower cervical spine. 4. Mild patchy hazy opacity of the right apex, nonspecific. Electronically Signed   By: Abelardo Diesel M.D.   On: 06/04/2021 12:47   MR BRAIN WO CONTRAST  Result Date: 06/05/2021 CLINICAL DATA:  Initial evaluation for generalized weakness. EXAM: MRI HEAD WITHOUT CONTRAST TECHNIQUE: Multiplanar, multiecho pulse sequences of the brain and surrounding structures were obtained without intravenous contrast. COMPARISON:  Prior CT from earlier the same day. FINDINGS: Brain: Cerebral volume within normal limits for age. No focal parenchymal signal abnormality or significant cerebral white matter disease. No evidence for acute or subacute infarct. Gray-white matter differentiation maintained. No encephalomalacia suggest  chronic cortical infarction. No acute or chronic intracranial blood products. No mass lesion, midline shift or mass effect. No hydrocephalus or extra-axial fluid collection. Pituitary gland suprasellar region normal. Midline structures intact and normal. Vascular: Major intracranial vascular flow voids are maintained. Skull and upper cervical spine: Craniocervical junction within normal limits. Bone marrow signal intensity diffusely decreased on T1 weighted sequence, nonspecific, but most commonly related to anemia, smoking or obesity. No focal marrow replacing lesion. No scalp soft tissue abnormality. Sinuses/Orbits: Globes and orbital soft tissues within normal limits. Paranasal sinuses are largely clear. No mastoid effusion. Other: None. IMPRESSION: Normal brain MRI for age.  No acute intracranial abnormality. Electronically Signed   By: Jeannine Boga M.D.   On: 06/05/2021 00:21   MR CERVICAL SPINE W WO CONTRAST  Result Date: 06/05/2021 CLINICAL DATA:  Initial evaluation for neck pain, weakness. EXAM: MRI CERVICAL SPINE WITHOUT AND WITH CONTRAST TECHNIQUE: Multiplanar and multiecho pulse sequences of the  cervical spine, to include the craniocervical junction and cervicothoracic junction, were obtained without and with intravenous contrast. CONTRAST:  25mL GADAVIST GADOBUTROL 1 MMOL/ML IV SOLN COMPARISON:  Comparison made with prior CT from earlier the same day. FINDINGS: Alignment: Examination somewhat degraded by motion artifact. Straightening of the normal cervical lordosis. No listhesis or static subluxation. Vertebrae: Vertebral body height maintained without acute or chronic fracture. Bone marrow signal intensity mildly decreased on T1 weighted imaging, nonspecific, but most commonly related to anemia, smoking, or obesity. No discrete or worrisome osseous lesions. Mild reactive marrow edema present about the right C5-6 facet due to facet arthritis (series 11, image 2). No other abnormal marrow  edema. Cord: Suspected subtle patchy cord signal abnormality at the level of C3-4, suspicious for compressive myelomalacia (series 11, image 7). No other convincing cord signal changes on this motion degraded exam. No abnormal enhancement. Posterior Fossa, vertebral arteries, paraspinal tissues: Craniocervical junction within normal limits. Localized edema within the posterior paraspinous soft tissues adjacent to the inflamed right C5-6 facet. Paraspinous soft tissues demonstrate no other acute finding. Normal flow voids seen within the vertebral arteries bilaterally. Disc levels: C2-C3: Negative interspace. Mild bilateral facet hypertrophy. No significant canal or foraminal stenosis. C3-C4: Diffuse disc bulge with endplate and uncovertebral spurring. Superimposed lobulated central to left paracentral disc protrusion flattens and effaces the ventral thecal sac (series 13, image 13). Protruding disc impinges upon the cervical spinal cord which is markedly flattened. Suspected subtle cord signal changes as above, most concerning for myelomalacia. Resultant severe spinal stenosis with near complete effacement of the thecal sac on the left. Severe left worse than right C4 foraminal stenosis. C4-C5: Degenerative intervertebral disc space narrowing with diffuse bulge with endplate and uncovertebral spurring. Central to right paracentral disc osteophyte complex indents the ventral thecal sac (series 13, image 18). Secondary flattening and compression of the right hemi cord without definite cord signal changes. Severe spinal stenosis, worse on the right. Severe right with moderate left C5 foraminal stenosis. C5-C6: Degenerative intervertebral disc space narrowing with diffuse disc osteophyte complex. Superimposed broad right paracentral component indents and largely effaces the ventral thecal sac (series 13, image 23). Secondary cord flattening without definite cord signal changes. Severe right-sided spinal stenosis with  the thecal sac measuring 3 mm in AP diameter. Moderate right worse than left C5 foraminal stenosis. C6-C7: Degenerative intervertebral disc space narrowing with diffuse disc osteophyte complex. Broad left paracentral component indents and effaces the ventral thecal sac (series 13, image 27). Secondary flattening of the left hemi cord without cord signal changes. Moderate to severe spinal stenosis. Moderate right worse than left C7 foraminal narrowing. C7-T1: Right paracentral disc extrusion indents the right ventral thecal sac (series 12, image 31). Associated slight superior migration (series 9, image 7). Mild right-sided spinal stenosis. Superimposed mild facet hypertrophy. Foramina remain patent. Visualized upper thoracic spine demonstrates no significant finding. IMPRESSION: 1. Advanced for age cervical spondylosis with resultant moderate to severe diffuse spinal stenosis at C3-4 through C6-7. Associated moderate to severe C4 through C7 foraminal stenosis as above. 2. Suspected subtle patchy cord signal abnormality at the level of C3-4, suspicious for compressive myelomalacia. Neuro surgical consultation recommended. 3. Mild reactive marrow edema about the right C5-6 facet due to facet arthritis. Finding could serve as a source for neck pain. These results were communicated to Dr. Leonel Ramsay at 12:35 am on 06/05/2021 by text page via the [facility] messaging system. Electronically Signed   By: Jeannine Boga M.D.   On: 06/05/2021 00:40  DG Chest Portable 1 View  Result Date: 06/04/2021 CLINICAL DATA:  Weakness. EXAM: PORTABLE CHEST 1 VIEW COMPARISON:  July 26, 2019 FINDINGS: The heart size and mediastinal contours are within normal limits. Both lungs are clear. The visualized skeletal structures are unremarkable. IMPRESSION: No active disease. Electronically Signed   By: Abelardo Diesel M.D.   On: 06/04/2021 12:22     Labs:   Basic Metabolic Panel: Recent Labs  Lab 06/04/21 1202 06/07/21 0332  NA  135 135  K 4.1 3.5  CL 103 100  CO2 25 24  GLUCOSE 90 80  BUN 8 10  CREATININE 0.50 0.52  CALCIUM 8.8* 8.4*  MG 1.8  --    GFR Estimated Creatinine Clearance: 107.7 mL/min (by C-G formula based on SCr of 0.52 mg/dL). Liver Function Tests: Recent Labs  Lab 06/04/21 1202 06/07/21 0332  AST 13* 11*  ALT 13 12  ALKPHOS 106 99  BILITOT 0.2* 0.2*  PROT 6.5 6.3*  ALBUMIN 2.8* 2.7*   No results for input(s): LIPASE, AMYLASE in the last 168 hours. No results for input(s): AMMONIA in the last 168 hours. Coagulation profile No results for input(s): INR, PROTIME in the last 168 hours.  CBC: Recent Labs  Lab 06/04/21 1202 06/07/21 0332  WBC 5.7 6.8  NEUTROABS 3.4  --   HGB 13.0 12.8  HCT 42.5 40.4  MCV 91.6 87.6  PLT 278 281   Cardiac Enzymes: Recent Labs  Lab 06/05/21 0202  CKTOTAL 14*   BNP: Invalid input(s): POCBNP CBG: No results for input(s): GLUCAP in the last 168 hours. D-Dimer No results for input(s): DDIMER in the last 72 hours. Hgb A1c No results for input(s): HGBA1C in the last 72 hours. Lipid Profile No results for input(s): CHOL, HDL, LDLCALC, TRIG, CHOLHDL, LDLDIRECT in the last 72 hours. Thyroid function studies No results for input(s): TSH, T4TOTAL, T3FREE, THYROIDAB in the last 72 hours.  Invalid input(s): FREET3 Anemia work up No results for input(s): VITAMINB12, FOLATE, FERRITIN, TIBC, IRON, RETICCTPCT in the last 72 hours. Microbiology Recent Results (from the past 240 hour(s))  Resp Panel by RT-PCR (Flu A&B, Covid) Nasopharyngeal Swab     Status: None   Collection Time: 06/04/21 12:08 PM   Specimen: Nasopharyngeal Swab; Nasopharyngeal(NP) swabs in vial transport medium  Result Value Ref Range Status   SARS Coronavirus 2 by RT PCR NEGATIVE NEGATIVE Final    Comment: (NOTE) SARS-CoV-2 target nucleic acids are NOT DETECTED.  The SARS-CoV-2 RNA is generally detectable in upper respiratory specimens during the acute phase of infection. The  lowest concentration of SARS-CoV-2 viral copies this assay can detect is 138 copies/mL. A negative result does not preclude SARS-Cov-2 infection and should not be used as the sole basis for treatment or other patient management decisions. A negative result may occur with  improper specimen collection/handling, submission of specimen other than nasopharyngeal swab, presence of viral mutation(s) within the areas targeted by this assay, and inadequate number of viral copies(<138 copies/mL). A negative result must be combined with clinical observations, patient history, and epidemiological information. The expected result is Negative.  Fact Sheet for Patients:  EntrepreneurPulse.com.au  Fact Sheet for Healthcare Providers:  IncredibleEmployment.be  This test is no t yet approved or cleared by the [place] FDA and  has been authorized for detection and/or diagnosis of SARS-CoV-2 by FDA under an Emergency Use Authorization (EUA). This EUA will remain  in effect (meaning this test can be used) for the duration of the COVID-19  declaration under Section 564(b)(1) of the Act, 21 U.S.C.section 360bbb-3(b)(1), unless the authorization is terminated  or revoked sooner.       Influenza A by PCR NEGATIVE NEGATIVE Final   Influenza B by PCR NEGATIVE NEGATIVE Final    Comment: (NOTE) The Xpert Xpress SARS-CoV-2/FLU/RSV plus assay is intended as an aid in the diagnosis of influenza from Nasopharyngeal swab specimens and should not be used as a sole basis for treatment. Nasal washings and aspirates are unacceptable for Xpert Xpress SARS-CoV-2/FLU/RSV testing.  Fact Sheet for Patients: EntrepreneurPulse.com.au  Fact Sheet for Healthcare Providers: IncredibleEmployment.be  This test is not yet approved or cleared by the [place] FDA and has been authorized for detection and/or diagnosis of SARS-CoV-2 by FDA under  an Emergency Use Authorization (EUA). This EUA will remain in effect (meaning this test can be used) for the duration of the COVID-19 declaration under Section 564(b)(1) of the Act, 21 U.S.C. section 360bbb-3(b)(1), unless the authorization is terminated or revoked.  Performed at [facility], [place] [address], [place], [place] [address]   Resp Panel by RT-PCR (Flu A&B, Covid) Nasopharyngeal Swab     Status: None   Collection Time: 06/08/21 11:58 AM   Specimen: Nasopharyngeal Swab; Nasopharyngeal(NP) swabs in vial transport medium  Result Value Ref Range Status   SARS Coronavirus 2 by RT PCR NEGATIVE NEGATIVE Final    Comment: (NOTE) SARS-CoV-2 target nucleic acids are NOT DETECTED.  The SARS-CoV-2 RNA is generally detectable in upper respiratory specimens during the acute phase of infection. The lowest concentration of SARS-CoV-2 viral copies this assay can detect is 138 copies/mL. A negative result does not preclude SARS-Cov-2 infection and should not be used as the sole basis for treatment or other patient management decisions. A negative result may occur with  improper specimen collection/handling, submission of specimen other than nasopharyngeal swab, presence of viral mutation(s) within the areas targeted by this assay, and inadequate number of viral copies(<138 copies/mL). A negative result must be combined with clinical observations, patient history, and epidemiological information. The expected result is Negative.  Fact Sheet for Patients:  EntrepreneurPulse.com.au  Fact Sheet for Healthcare Providers:  IncredibleEmployment.be  This test is no t yet approved or cleared by the [place] FDA and  has been authorized for detection and/or diagnosis of SARS-CoV-2 by FDA under an Emergency Use Authorization (EUA). This EUA will remain  in effect (meaning this test can be used) for the duration of the COVID-19 declaration under  Section 564(b)(1) of the Act, 21 U.S.C.section 360bbb-3(b)(1), unless the authorization is terminated  or revoked sooner.       Influenza A by PCR NEGATIVE NEGATIVE Final   Influenza B by PCR NEGATIVE NEGATIVE Final    Comment: (NOTE) The Xpert Xpress SARS-CoV-2/FLU/RSV plus assay is intended as an aid in the diagnosis of influenza from Nasopharyngeal swab specimens and should not be used as a sole basis for treatment. Nasal washings and aspirates are unacceptable for Xpert Xpress SARS-CoV-2/FLU/RSV testing.  Fact Sheet for Patients: EntrepreneurPulse.com.au  Fact Sheet for Healthcare Providers: IncredibleEmployment.be  This test is not yet approved or cleared by the [place] FDA and has been authorized for detection and/or diagnosis of SARS-CoV-2 by FDA under an Emergency Use Authorization (EUA). This EUA will remain in effect (meaning this test can be used) for the duration of the COVID-19 declaration under Section 564(b)(1) of the Act, 21 U.S.C. section 360bbb-3(b)(1), unless the authorization is terminated or revoked.  Performed at [facility], [place] [address], [place], [place] [address]      Signed: Terrilee Croak  [facility] 06/09/2021, 10:15 AM

## 2021-06-09 NOTE — Progress Notes (Signed)
Physical Therapy Treatment Patient Details Name: [name] MRN: [id number] DOB: 04-01-1971 Today's Date: 06/09/2021   History of Present Illness 51 y/o female presented to ED on 06/04/21 for evaluation of worsening bilateral LE weakness over past 6 months after falls in Aug/Sept of 2022. MRI showed moderate to severe spinal stenosis C3-7 and suspected subtle patchy cord signal abnormality at level of C3-4, suspicious of compressive myelomalacia. Waiting for neurosurgical consult. PMH: obesity, HTN, sleep apnea    PT Comments    Patient progressing with LE strength and able to perform 3 partial stands using back of recliner handles to pull up and with PT assist to block knees.  Also able to perform bridging working on hip extension.  Reports more achy feeling in LE's and noted cloudy urine with pt stating started on antibiotics.  Feel she remains appropriate for home with close family support, HHPT and equipment as noted below.  Recommendations for follow up therapy are one component of a multi-disciplinary discharge planning process, led by the attending physician.  Recommendations may be updated based on patient status, additional functional criteria and insurance authorization.  Follow Up Recommendations  Home health PT     Assistance Recommended at Discharge Frequent or constant Supervision/Assistance  Patient can return home with the following A lot of help with walking and/or transfers;A lot of help with bathing/dressing/bathroom;Assistance with cooking/housework;Assist for transportation   Equipment Recommendations  Hospital bed;Wheelchair cushion (measurements PT);Wheelchair (measurements PT);Other (comment)    Recommendations for Other Services       Precautions / Restrictions Precautions Precautions: Fall     Mobility  Bed Mobility Overal bed mobility: Needs Assistance Bed Mobility: Rolling, Sidelying to Sit, Sit to Supine Rolling: Supervision Sidelying to sit: Min  guard   Sit to supine: Min assist   General bed mobility comments: increased time and used rail to roll, to sitting with minguard for trunk support as pt needed increased time to come upright; to supine with assist for L LE onto bed; tilted bed and pt able to help with scooting min a of 2 to scoot to [facility]    Transfers Overall transfer level: Needs assistance Equipment used:  (pulling up on handles on back of recliner) Transfers: Sit to/from Stand Sit to Stand: Mod assist           General transfer comment: able to achieve lift off but not full stand x 3 reps wtih A to block L knee and gradually sitting down closer to EOB so assisted to scoot hips back with reciprocal technique mod cues for anterior weight shift and mod A to move hip back on bed    Ambulation/Gait                   Stairs             Wheelchair Mobility    Modified Rankin (Stroke Patients Only)       Balance Overall balance assessment: Needs assistance   Sitting balance-Leahy Scale: Fair Sitting balance - Comments: on EOB with S when getting closer to EOB with successive attempts to stand   Standing balance support: Bilateral upper extremity supported Standing balance-Leahy Scale: Zero Standing balance comment: up to partial stand x 3                            Cognition Arousal/Alertness: Awake/alert Behavior During Therapy: WFL for tasks assessed/performed Overall Cognitive Status: Within Functional Limits for tasks  assessed                                          Exercises General Exercises - Lower Extremity Ankle Circles/Pumps: PROM, 10 reps, Both, Supine Heel Slides: AROM, Both, 5 reps, Supine Hip Flexion/Marching: AAROM, Both, 5 reps, Supine Other Exercises Other Exercises: bridging with assist for LE's stabilized on bed x 5 reps    General Comments General comments (skin integrity, edema, etc.): noted cloudy more yellow urine and pt report of  achiness in LE's; reports was started on antibiotic      Pertinent Vitals/Pain Pain Assessment Pain Assessment: Faces Faces Pain Scale: Hurts little more Pain Location: LE's Pain Descriptors / Indicators: Aching Pain Intervention(s): Monitored during session, Repositioned    Home Living                          Prior Function            PT Goals (current goals can now be found in the care plan section) Progress towards PT goals: Progressing toward goals    Frequency    Min 3X/week      PT Plan Current plan remains appropriate    Co-evaluation              AM-PAC PT "6 Clicks" Mobility   Outcome Measure  Help needed turning from your back to your side while in a flat bed without using bedrails?: A Lot Help needed moving from lying on your back to sitting on the side of a flat bed without using bedrails?: A Lot Help needed moving to and from a bed to a chair (including a wheelchair)?: A Lot Help needed standing up from a chair using your arms (e.g., wheelchair or bedside chair)?: Total Help needed to walk in hospital room?: Total Help needed climbing 3-5 steps with a railing? : Total 6 Click Score: 9    End of Session Equipment Utilized During Treatment: Gait belt Activity Tolerance: Patient tolerated treatment well Patient left: in bed;with call bell/phone within reach (bed in chair position)   PT Visit Diagnosis: Other abnormalities of gait and mobility (R26.89);Muscle weakness (generalized) (M62.81)     Time: 2993-7169 PT Time Calculation (min) (ACUTE ONLY): 23 min  Charges:  $Therapeutic Exercise: 8-22 mins $Therapeutic Activity: 8-22 mins                     Magda Kiel, PT Acute Rehabilitation Services CVELF:[phone] Office:[phone] 06/09/2021    Reginia Naas 06/09/2021, 1:51 PM

## 2021-06-09 NOTE — Telephone Encounter (Signed)
Pt appears to have had refill 06/05/21 but I am not authorized to decline this medication

## 2021-06-10 ENCOUNTER — Other Ambulatory Visit: Payer: Self-pay | Admitting: Family Medicine

## 2021-06-10 ENCOUNTER — Telehealth: Payer: Self-pay

## 2021-06-10 NOTE — Progress Notes (Signed)
Patient was prepared for discharge yesterday. She had transportation scheduled for pickup late last night.  Patient did not feel comfortable getting her at that time. Briefly seen and examined this morning. Okay to discharge this morning. [facility] will not bill for today.

## 2021-06-10 NOTE — Plan of Care (Signed)
  Problem: Education: Goal: Knowledge of General Education information will improve Description: Including pain rating scale, medication(s)/side effects and non-pharmacologic comfort measures Outcome: Progressing   Problem: Safety: Goal: Ability to remain free from injury will improve Outcome: Progressing   Problem: Skin Integrity: Goal: Risk for impaired skin integrity will decrease Outcome: Progressing   

## 2021-06-10 NOTE — Progress Notes (Signed)
Transportation came to transport patient to home via ambulance but she refuse to leave because she said it was too late. She said that she live with her 52 year old dad and there is no way he can help her and she does need help. She was informed that she would have to leave because she was discharge because she no longer needed hospitalization but she refuse to leave and said that she will go home later today when her son can be there to assist her. Dr Tonie Griffith was notified of her refusal to leave and he said she can stay tonight and leave later this morning. Said to notify the case management to arrange for transportation home later this morning.

## 2021-06-10 NOTE — Progress Notes (Signed)
Notified by RN that ambulance has arrived to take pt home. Was discharged morning of 06/09/21 but ambulance did not come to transport until now.  Per note in chart, DME arrangements have been made for her to have equipment at home that she will need. Pt refuses to go home. States she lives with her elderly 52 yo father who is not able to help her tonight. Told RN that her son is going to be there tomorrow to help her when she arrives home.  Hold discharge until morning as pt states she is not safe to go home tonight.

## 2021-06-10 NOTE — TOC Transition Note (Signed)
Transition of Care [facility]) - CM/SW Discharge Note   Patient Details  Name: [name] MRN: [id number] Date of Birth: 09/25/1970  Transition of Care [facility]) CM/SW Contact:  Geralynn Ochs, LCSW Phone Number: 06/10/2021, 9:10 AM   Clinical Narrative:   CSW spoke with patient and son, confirmed with [employer] delivery. Home health set up with [place]. Scheduled transport with [facility] home. No further CSW needs at this time.    Final next level of care: [place] Barriers to Discharge: Barriers Resolved   Patient Goals and CMS Choice Patient states their goals for this hospitalization and ongoing recovery are:: Pt states she would like to be able to return home more independently. CMS Medicare.gov Compare Post Acute Care list provided to:: Patient Choice offered to / list presented to : Patient  Discharge Placement                Patient to be transferred to facility by: [place] Name of family member notified: Self, Chris Patient and family notified of of transfer: 06/10/21  Discharge Plan and Services     Post Acute Care Choice: [place]                               Social Determinants of Health (SDOH) Interventions     Readmission Risk Interventions No flowsheet data found.

## 2021-06-10 NOTE — Telephone Encounter (Signed)
Transition Care Management Unsuccessful Follow-up Telephone Call  Date of discharge and from where:  06/10/2021  [facility]   Attempts:  1st Attempt  Reason for unsuccessful TCM follow-up call:  Unable to leave message

## 2021-06-10 NOTE — TOC Transition Note (Signed)
Transition of Care [facility]) - CM/SW Discharge Note   Patient Details  Name: [name] MRN: [id number] Date of Birth: 1971/03/19  Transition of Care [facility]) CM/SW Contact:  Geralynn Ochs, LCSW Phone Number: 06/10/2021, 9:11 AM   Clinical Narrative:   Patient still hospitalized as she refused transport home last night. Scheduled pickup with [facility] for this morning. RN aware. No other needs at this time.    Final next level of care: [place] Barriers to Discharge: Barriers Resolved   Patient Goals and CMS Choice Patient states their goals for this hospitalization and ongoing recovery are:: Pt states she would like to be able to return home more independently. CMS Medicare.gov Compare Post Acute Care list provided to:: Patient Choice offered to / list presented to : Patient  Discharge Placement                Patient to be transferred to facility by: [place] Name of family member notified: Self, Chris Patient and family notified of of transfer: 06/10/21  Discharge Plan and Services     Post Acute Care Choice: [place]                               Social Determinants of Health (SDOH) Interventions     Readmission Risk Interventions No flowsheet data found.

## 2021-06-10 NOTE — Telephone Encounter (Signed)
Patient is requesting a refill of the following medications: Requested Prescriptions   Pending Prescriptions Disp Refills   HYDROcodone-acetaminophen ([place]) 10-325 MG tablet [Pharmacy Med Name: hydrocodone 10 mg-acetaminophen 325 mg tablet] 90 tablet 0    Sig: TAKE 1 TABLET BY MOUTH EVERY 6 HOURS AS NEEDED    Date of patient request: 06/10/21 Last office visit: 05/31/21 Date of last refill: 05/07/22 Last refill amount: 90

## 2021-06-12 DIAGNOSIS — L989 Disorder of the skin and subcutaneous tissue, unspecified: Secondary | ICD-10-CM | POA: Diagnosis not present

## 2021-06-12 DIAGNOSIS — K760 Fatty (change of) liver, not elsewhere classified: Secondary | ICD-10-CM | POA: Diagnosis not present

## 2021-06-12 DIAGNOSIS — R238 Other skin changes: Secondary | ICD-10-CM | POA: Diagnosis not present

## 2021-06-12 DIAGNOSIS — F32A Depression, unspecified: Secondary | ICD-10-CM | POA: Diagnosis not present

## 2021-06-12 DIAGNOSIS — I10 Essential (primary) hypertension: Secondary | ICD-10-CM | POA: Diagnosis not present

## 2021-06-12 DIAGNOSIS — I872 Venous insufficiency (chronic) (peripheral): Secondary | ICD-10-CM | POA: Diagnosis not present

## 2021-06-12 DIAGNOSIS — W19XXXD Unspecified fall, subsequent encounter: Secondary | ICD-10-CM | POA: Diagnosis not present

## 2021-06-12 DIAGNOSIS — E538 Deficiency of other specified B group vitamins: Secondary | ICD-10-CM | POA: Diagnosis not present

## 2021-06-12 DIAGNOSIS — Z9181 History of falling: Secondary | ICD-10-CM | POA: Diagnosis not present

## 2021-06-12 DIAGNOSIS — N390 Urinary tract infection, site not specified: Secondary | ICD-10-CM | POA: Diagnosis not present

## 2021-06-12 DIAGNOSIS — G9529 Other cord compression: Secondary | ICD-10-CM | POA: Diagnosis not present

## 2021-06-12 DIAGNOSIS — G4733 Obstructive sleep apnea (adult) (pediatric): Secondary | ICD-10-CM | POA: Diagnosis not present

## 2021-06-12 DIAGNOSIS — G43909 Migraine, unspecified, not intractable, without status migrainosus: Secondary | ICD-10-CM | POA: Diagnosis not present

## 2021-06-12 DIAGNOSIS — G9589 Other specified diseases of spinal cord: Secondary | ICD-10-CM | POA: Diagnosis not present

## 2021-06-12 DIAGNOSIS — R350 Frequency of micturition: Secondary | ICD-10-CM | POA: Diagnosis not present

## 2021-06-12 DIAGNOSIS — M4802 Spinal stenosis, cervical region: Secondary | ICD-10-CM | POA: Diagnosis not present

## 2021-06-12 DIAGNOSIS — M300 Polyarteritis nodosa: Secondary | ICD-10-CM | POA: Diagnosis not present

## 2021-06-12 DIAGNOSIS — M797 Fibromyalgia: Secondary | ICD-10-CM | POA: Diagnosis not present

## 2021-06-12 DIAGNOSIS — E785 Hyperlipidemia, unspecified: Secondary | ICD-10-CM | POA: Diagnosis not present

## 2021-06-12 DIAGNOSIS — K219 Gastro-esophageal reflux disease without esophagitis: Secondary | ICD-10-CM | POA: Diagnosis not present

## 2021-06-12 DIAGNOSIS — M5136 Other intervertebral disc degeneration, lumbar region: Secondary | ICD-10-CM | POA: Diagnosis not present

## 2021-06-12 DIAGNOSIS — E559 Vitamin D deficiency, unspecified: Secondary | ICD-10-CM | POA: Diagnosis not present

## 2021-06-12 DIAGNOSIS — K259 Gastric ulcer, unspecified as acute or chronic, without hemorrhage or perforation: Secondary | ICD-10-CM | POA: Diagnosis not present

## 2021-06-12 DIAGNOSIS — G629 Polyneuropathy, unspecified: Secondary | ICD-10-CM | POA: Diagnosis not present

## 2021-06-13 DIAGNOSIS — E785 Hyperlipidemia, unspecified: Secondary | ICD-10-CM | POA: Diagnosis not present

## 2021-06-13 DIAGNOSIS — M4802 Spinal stenosis, cervical region: Secondary | ICD-10-CM | POA: Diagnosis not present

## 2021-06-13 DIAGNOSIS — G43909 Migraine, unspecified, not intractable, without status migrainosus: Secondary | ICD-10-CM | POA: Diagnosis not present

## 2021-06-13 DIAGNOSIS — N390 Urinary tract infection, site not specified: Secondary | ICD-10-CM | POA: Diagnosis not present

## 2021-06-13 DIAGNOSIS — K219 Gastro-esophageal reflux disease without esophagitis: Secondary | ICD-10-CM | POA: Diagnosis not present

## 2021-06-13 DIAGNOSIS — I872 Venous insufficiency (chronic) (peripheral): Secondary | ICD-10-CM | POA: Diagnosis not present

## 2021-06-13 DIAGNOSIS — Z9181 History of falling: Secondary | ICD-10-CM | POA: Diagnosis not present

## 2021-06-13 DIAGNOSIS — W19XXXD Unspecified fall, subsequent encounter: Secondary | ICD-10-CM | POA: Diagnosis not present

## 2021-06-13 DIAGNOSIS — G9529 Other cord compression: Secondary | ICD-10-CM | POA: Diagnosis not present

## 2021-06-13 DIAGNOSIS — M797 Fibromyalgia: Secondary | ICD-10-CM | POA: Diagnosis not present

## 2021-06-13 DIAGNOSIS — G629 Polyneuropathy, unspecified: Secondary | ICD-10-CM | POA: Diagnosis not present

## 2021-06-13 DIAGNOSIS — R238 Other skin changes: Secondary | ICD-10-CM | POA: Diagnosis not present

## 2021-06-13 DIAGNOSIS — K259 Gastric ulcer, unspecified as acute or chronic, without hemorrhage or perforation: Secondary | ICD-10-CM | POA: Diagnosis not present

## 2021-06-13 DIAGNOSIS — M300 Polyarteritis nodosa: Secondary | ICD-10-CM | POA: Diagnosis not present

## 2021-06-13 DIAGNOSIS — G4733 Obstructive sleep apnea (adult) (pediatric): Secondary | ICD-10-CM | POA: Diagnosis not present

## 2021-06-13 DIAGNOSIS — E538 Deficiency of other specified B group vitamins: Secondary | ICD-10-CM | POA: Diagnosis not present

## 2021-06-13 DIAGNOSIS — K760 Fatty (change of) liver, not elsewhere classified: Secondary | ICD-10-CM | POA: Diagnosis not present

## 2021-06-13 DIAGNOSIS — M5136 Other intervertebral disc degeneration, lumbar region: Secondary | ICD-10-CM | POA: Diagnosis not present

## 2021-06-13 DIAGNOSIS — L989 Disorder of the skin and subcutaneous tissue, unspecified: Secondary | ICD-10-CM | POA: Diagnosis not present

## 2021-06-13 DIAGNOSIS — R350 Frequency of micturition: Secondary | ICD-10-CM | POA: Diagnosis not present

## 2021-06-13 DIAGNOSIS — G9589 Other specified diseases of spinal cord: Secondary | ICD-10-CM | POA: Diagnosis not present

## 2021-06-13 DIAGNOSIS — E559 Vitamin D deficiency, unspecified: Secondary | ICD-10-CM | POA: Diagnosis not present

## 2021-06-13 DIAGNOSIS — I10 Essential (primary) hypertension: Secondary | ICD-10-CM | POA: Diagnosis not present

## 2021-06-13 DIAGNOSIS — F32A Depression, unspecified: Secondary | ICD-10-CM | POA: Diagnosis not present

## 2021-06-15 ENCOUNTER — Telehealth: Payer: Self-pay

## 2021-06-15 DIAGNOSIS — I10 Essential (primary) hypertension: Secondary | ICD-10-CM | POA: Diagnosis not present

## 2021-06-15 DIAGNOSIS — G9529 Other cord compression: Secondary | ICD-10-CM | POA: Diagnosis not present

## 2021-06-15 DIAGNOSIS — M4802 Spinal stenosis, cervical region: Secondary | ICD-10-CM | POA: Diagnosis not present

## 2021-06-15 DIAGNOSIS — Z9181 History of falling: Secondary | ICD-10-CM | POA: Diagnosis not present

## 2021-06-15 DIAGNOSIS — L989 Disorder of the skin and subcutaneous tissue, unspecified: Secondary | ICD-10-CM | POA: Diagnosis not present

## 2021-06-15 DIAGNOSIS — I872 Venous insufficiency (chronic) (peripheral): Secondary | ICD-10-CM | POA: Diagnosis not present

## 2021-06-15 DIAGNOSIS — F32A Depression, unspecified: Secondary | ICD-10-CM | POA: Diagnosis not present

## 2021-06-15 DIAGNOSIS — M5136 Other intervertebral disc degeneration, lumbar region: Secondary | ICD-10-CM | POA: Diagnosis not present

## 2021-06-15 DIAGNOSIS — K760 Fatty (change of) liver, not elsewhere classified: Secondary | ICD-10-CM | POA: Diagnosis not present

## 2021-06-15 DIAGNOSIS — E538 Deficiency of other specified B group vitamins: Secondary | ICD-10-CM | POA: Diagnosis not present

## 2021-06-15 DIAGNOSIS — R238 Other skin changes: Secondary | ICD-10-CM | POA: Diagnosis not present

## 2021-06-15 DIAGNOSIS — G629 Polyneuropathy, unspecified: Secondary | ICD-10-CM | POA: Diagnosis not present

## 2021-06-15 DIAGNOSIS — W19XXXD Unspecified fall, subsequent encounter: Secondary | ICD-10-CM | POA: Diagnosis not present

## 2021-06-15 DIAGNOSIS — R350 Frequency of micturition: Secondary | ICD-10-CM | POA: Diagnosis not present

## 2021-06-15 DIAGNOSIS — K219 Gastro-esophageal reflux disease without esophagitis: Secondary | ICD-10-CM | POA: Diagnosis not present

## 2021-06-15 DIAGNOSIS — N390 Urinary tract infection, site not specified: Secondary | ICD-10-CM | POA: Diagnosis not present

## 2021-06-15 DIAGNOSIS — E559 Vitamin D deficiency, unspecified: Secondary | ICD-10-CM | POA: Diagnosis not present

## 2021-06-15 DIAGNOSIS — E785 Hyperlipidemia, unspecified: Secondary | ICD-10-CM | POA: Diagnosis not present

## 2021-06-15 DIAGNOSIS — G4733 Obstructive sleep apnea (adult) (pediatric): Secondary | ICD-10-CM | POA: Diagnosis not present

## 2021-06-15 DIAGNOSIS — G43909 Migraine, unspecified, not intractable, without status migrainosus: Secondary | ICD-10-CM | POA: Diagnosis not present

## 2021-06-15 DIAGNOSIS — M300 Polyarteritis nodosa: Secondary | ICD-10-CM | POA: Diagnosis not present

## 2021-06-15 DIAGNOSIS — K259 Gastric ulcer, unspecified as acute or chronic, without hemorrhage or perforation: Secondary | ICD-10-CM | POA: Diagnosis not present

## 2021-06-15 DIAGNOSIS — G9589 Other specified diseases of spinal cord: Secondary | ICD-10-CM | POA: Diagnosis not present

## 2021-06-15 DIAGNOSIS — M797 Fibromyalgia: Secondary | ICD-10-CM | POA: Diagnosis not present

## 2021-06-15 NOTE — Telephone Encounter (Signed)
Pt is reporting continued pain and is asking for abx ointment for her scab as well. Please advise

## 2021-06-15 NOTE — Telephone Encounter (Signed)
Is the patient requesting abx ointment and pain meds?  Or is home health asking for orders?  She has chronic pain medication available to her and I am not able to increase at this time.  If she needs additional pain control, we will need to refer to pain management

## 2021-06-15 NOTE — Telephone Encounter (Signed)
Caller name:[place] ([place]) [phone]  On DPR? :Yes  Call back number:[phone]  Provider they see: Birdie Riddle   Reason for call:Wound Care Order  Wanting antibiotic ointment and cover with Band-Aid on scab instead of bandages. Also pt said 7 out of 10 with pain in her back but she had just took a pain pill

## 2021-06-16 ENCOUNTER — Inpatient Hospital Stay: Payer: Medicare Other | Admitting: Family Medicine

## 2021-06-16 DIAGNOSIS — G9529 Other cord compression: Secondary | ICD-10-CM | POA: Diagnosis not present

## 2021-06-16 DIAGNOSIS — L989 Disorder of the skin and subcutaneous tissue, unspecified: Secondary | ICD-10-CM | POA: Diagnosis not present

## 2021-06-16 DIAGNOSIS — M300 Polyarteritis nodosa: Secondary | ICD-10-CM | POA: Diagnosis not present

## 2021-06-16 DIAGNOSIS — Z9181 History of falling: Secondary | ICD-10-CM | POA: Diagnosis not present

## 2021-06-16 DIAGNOSIS — M797 Fibromyalgia: Secondary | ICD-10-CM | POA: Diagnosis not present

## 2021-06-16 DIAGNOSIS — M5136 Other intervertebral disc degeneration, lumbar region: Secondary | ICD-10-CM | POA: Diagnosis not present

## 2021-06-16 DIAGNOSIS — W19XXXD Unspecified fall, subsequent encounter: Secondary | ICD-10-CM | POA: Diagnosis not present

## 2021-06-16 DIAGNOSIS — N390 Urinary tract infection, site not specified: Secondary | ICD-10-CM | POA: Diagnosis not present

## 2021-06-16 DIAGNOSIS — R238 Other skin changes: Secondary | ICD-10-CM | POA: Diagnosis not present

## 2021-06-16 DIAGNOSIS — E559 Vitamin D deficiency, unspecified: Secondary | ICD-10-CM | POA: Diagnosis not present

## 2021-06-16 DIAGNOSIS — I10 Essential (primary) hypertension: Secondary | ICD-10-CM | POA: Diagnosis not present

## 2021-06-16 DIAGNOSIS — G9589 Other specified diseases of spinal cord: Secondary | ICD-10-CM | POA: Diagnosis not present

## 2021-06-16 DIAGNOSIS — E785 Hyperlipidemia, unspecified: Secondary | ICD-10-CM | POA: Diagnosis not present

## 2021-06-16 DIAGNOSIS — I872 Venous insufficiency (chronic) (peripheral): Secondary | ICD-10-CM | POA: Diagnosis not present

## 2021-06-16 DIAGNOSIS — G629 Polyneuropathy, unspecified: Secondary | ICD-10-CM | POA: Diagnosis not present

## 2021-06-16 DIAGNOSIS — K760 Fatty (change of) liver, not elsewhere classified: Secondary | ICD-10-CM | POA: Diagnosis not present

## 2021-06-16 DIAGNOSIS — K219 Gastro-esophageal reflux disease without esophagitis: Secondary | ICD-10-CM | POA: Diagnosis not present

## 2021-06-16 DIAGNOSIS — F32A Depression, unspecified: Secondary | ICD-10-CM | POA: Diagnosis not present

## 2021-06-16 DIAGNOSIS — K259 Gastric ulcer, unspecified as acute or chronic, without hemorrhage or perforation: Secondary | ICD-10-CM | POA: Diagnosis not present

## 2021-06-16 DIAGNOSIS — G4733 Obstructive sleep apnea (adult) (pediatric): Secondary | ICD-10-CM | POA: Diagnosis not present

## 2021-06-16 DIAGNOSIS — M4802 Spinal stenosis, cervical region: Secondary | ICD-10-CM | POA: Diagnosis not present

## 2021-06-16 DIAGNOSIS — G43909 Migraine, unspecified, not intractable, without status migrainosus: Secondary | ICD-10-CM | POA: Diagnosis not present

## 2021-06-16 DIAGNOSIS — E538 Deficiency of other specified B group vitamins: Secondary | ICD-10-CM | POA: Diagnosis not present

## 2021-06-16 DIAGNOSIS — R350 Frequency of micturition: Secondary | ICD-10-CM | POA: Diagnosis not present

## 2021-06-16 NOTE — Telephone Encounter (Signed)
Called and spoke to [place] from home health, gave her the verbal orders for the antbx ointment and the bandage. Attempted to contact patient about the pain. No answer, left vm for her to return call

## 2021-06-17 DIAGNOSIS — G9529 Other cord compression: Secondary | ICD-10-CM | POA: Diagnosis not present

## 2021-06-17 DIAGNOSIS — I872 Venous insufficiency (chronic) (peripheral): Secondary | ICD-10-CM | POA: Diagnosis not present

## 2021-06-17 DIAGNOSIS — R238 Other skin changes: Secondary | ICD-10-CM | POA: Diagnosis not present

## 2021-06-17 DIAGNOSIS — M5136 Other intervertebral disc degeneration, lumbar region: Secondary | ICD-10-CM | POA: Diagnosis not present

## 2021-06-17 DIAGNOSIS — R350 Frequency of micturition: Secondary | ICD-10-CM | POA: Diagnosis not present

## 2021-06-17 DIAGNOSIS — Z9181 History of falling: Secondary | ICD-10-CM | POA: Diagnosis not present

## 2021-06-17 DIAGNOSIS — L989 Disorder of the skin and subcutaneous tissue, unspecified: Secondary | ICD-10-CM | POA: Diagnosis not present

## 2021-06-17 DIAGNOSIS — M797 Fibromyalgia: Secondary | ICD-10-CM | POA: Diagnosis not present

## 2021-06-17 DIAGNOSIS — K760 Fatty (change of) liver, not elsewhere classified: Secondary | ICD-10-CM | POA: Diagnosis not present

## 2021-06-17 DIAGNOSIS — E559 Vitamin D deficiency, unspecified: Secondary | ICD-10-CM | POA: Diagnosis not present

## 2021-06-17 DIAGNOSIS — G4733 Obstructive sleep apnea (adult) (pediatric): Secondary | ICD-10-CM | POA: Diagnosis not present

## 2021-06-17 DIAGNOSIS — G629 Polyneuropathy, unspecified: Secondary | ICD-10-CM | POA: Diagnosis not present

## 2021-06-17 DIAGNOSIS — F32A Depression, unspecified: Secondary | ICD-10-CM | POA: Diagnosis not present

## 2021-06-17 DIAGNOSIS — I10 Essential (primary) hypertension: Secondary | ICD-10-CM | POA: Diagnosis not present

## 2021-06-17 DIAGNOSIS — N390 Urinary tract infection, site not specified: Secondary | ICD-10-CM | POA: Diagnosis not present

## 2021-06-17 DIAGNOSIS — K219 Gastro-esophageal reflux disease without esophagitis: Secondary | ICD-10-CM | POA: Diagnosis not present

## 2021-06-17 DIAGNOSIS — E538 Deficiency of other specified B group vitamins: Secondary | ICD-10-CM | POA: Diagnosis not present

## 2021-06-17 DIAGNOSIS — E785 Hyperlipidemia, unspecified: Secondary | ICD-10-CM | POA: Diagnosis not present

## 2021-06-17 DIAGNOSIS — K259 Gastric ulcer, unspecified as acute or chronic, without hemorrhage or perforation: Secondary | ICD-10-CM | POA: Diagnosis not present

## 2021-06-17 DIAGNOSIS — G9589 Other specified diseases of spinal cord: Secondary | ICD-10-CM | POA: Diagnosis not present

## 2021-06-17 DIAGNOSIS — M4802 Spinal stenosis, cervical region: Secondary | ICD-10-CM | POA: Diagnosis not present

## 2021-06-17 DIAGNOSIS — W19XXXD Unspecified fall, subsequent encounter: Secondary | ICD-10-CM | POA: Diagnosis not present

## 2021-06-17 DIAGNOSIS — M300 Polyarteritis nodosa: Secondary | ICD-10-CM | POA: Diagnosis not present

## 2021-06-17 DIAGNOSIS — G43909 Migraine, unspecified, not intractable, without status migrainosus: Secondary | ICD-10-CM | POA: Diagnosis not present

## 2021-06-18 DIAGNOSIS — K219 Gastro-esophageal reflux disease without esophagitis: Secondary | ICD-10-CM | POA: Diagnosis not present

## 2021-06-18 DIAGNOSIS — G43909 Migraine, unspecified, not intractable, without status migrainosus: Secondary | ICD-10-CM | POA: Diagnosis not present

## 2021-06-18 DIAGNOSIS — K760 Fatty (change of) liver, not elsewhere classified: Secondary | ICD-10-CM | POA: Diagnosis not present

## 2021-06-18 DIAGNOSIS — W19XXXD Unspecified fall, subsequent encounter: Secondary | ICD-10-CM | POA: Diagnosis not present

## 2021-06-18 DIAGNOSIS — K259 Gastric ulcer, unspecified as acute or chronic, without hemorrhage or perforation: Secondary | ICD-10-CM | POA: Diagnosis not present

## 2021-06-18 DIAGNOSIS — G9529 Other cord compression: Secondary | ICD-10-CM | POA: Diagnosis not present

## 2021-06-18 DIAGNOSIS — G629 Polyneuropathy, unspecified: Secondary | ICD-10-CM | POA: Diagnosis not present

## 2021-06-18 DIAGNOSIS — M300 Polyarteritis nodosa: Secondary | ICD-10-CM | POA: Diagnosis not present

## 2021-06-18 DIAGNOSIS — F32A Depression, unspecified: Secondary | ICD-10-CM | POA: Diagnosis not present

## 2021-06-18 DIAGNOSIS — Z9181 History of falling: Secondary | ICD-10-CM | POA: Diagnosis not present

## 2021-06-18 DIAGNOSIS — R350 Frequency of micturition: Secondary | ICD-10-CM | POA: Diagnosis not present

## 2021-06-18 DIAGNOSIS — E538 Deficiency of other specified B group vitamins: Secondary | ICD-10-CM | POA: Diagnosis not present

## 2021-06-18 DIAGNOSIS — E785 Hyperlipidemia, unspecified: Secondary | ICD-10-CM | POA: Diagnosis not present

## 2021-06-18 DIAGNOSIS — R238 Other skin changes: Secondary | ICD-10-CM | POA: Diagnosis not present

## 2021-06-18 DIAGNOSIS — G9589 Other specified diseases of spinal cord: Secondary | ICD-10-CM | POA: Diagnosis not present

## 2021-06-18 DIAGNOSIS — N390 Urinary tract infection, site not specified: Secondary | ICD-10-CM | POA: Diagnosis not present

## 2021-06-18 DIAGNOSIS — G4733 Obstructive sleep apnea (adult) (pediatric): Secondary | ICD-10-CM | POA: Diagnosis not present

## 2021-06-18 DIAGNOSIS — M797 Fibromyalgia: Secondary | ICD-10-CM | POA: Diagnosis not present

## 2021-06-18 DIAGNOSIS — M4802 Spinal stenosis, cervical region: Secondary | ICD-10-CM | POA: Diagnosis not present

## 2021-06-18 DIAGNOSIS — M5136 Other intervertebral disc degeneration, lumbar region: Secondary | ICD-10-CM | POA: Diagnosis not present

## 2021-06-18 DIAGNOSIS — E559 Vitamin D deficiency, unspecified: Secondary | ICD-10-CM | POA: Diagnosis not present

## 2021-06-18 DIAGNOSIS — I10 Essential (primary) hypertension: Secondary | ICD-10-CM | POA: Diagnosis not present

## 2021-06-18 DIAGNOSIS — I872 Venous insufficiency (chronic) (peripheral): Secondary | ICD-10-CM | POA: Diagnosis not present

## 2021-06-18 DIAGNOSIS — L989 Disorder of the skin and subcutaneous tissue, unspecified: Secondary | ICD-10-CM | POA: Diagnosis not present

## 2021-06-20 DIAGNOSIS — G9529 Other cord compression: Secondary | ICD-10-CM | POA: Diagnosis not present

## 2021-06-20 DIAGNOSIS — I872 Venous insufficiency (chronic) (peripheral): Secondary | ICD-10-CM | POA: Diagnosis not present

## 2021-06-20 DIAGNOSIS — G4733 Obstructive sleep apnea (adult) (pediatric): Secondary | ICD-10-CM | POA: Diagnosis not present

## 2021-06-20 DIAGNOSIS — M5136 Other intervertebral disc degeneration, lumbar region: Secondary | ICD-10-CM | POA: Diagnosis not present

## 2021-06-20 DIAGNOSIS — R238 Other skin changes: Secondary | ICD-10-CM | POA: Diagnosis not present

## 2021-06-20 DIAGNOSIS — K219 Gastro-esophageal reflux disease without esophagitis: Secondary | ICD-10-CM | POA: Diagnosis not present

## 2021-06-20 DIAGNOSIS — E785 Hyperlipidemia, unspecified: Secondary | ICD-10-CM | POA: Diagnosis not present

## 2021-06-20 DIAGNOSIS — M4802 Spinal stenosis, cervical region: Secondary | ICD-10-CM | POA: Diagnosis not present

## 2021-06-20 DIAGNOSIS — N390 Urinary tract infection, site not specified: Secondary | ICD-10-CM | POA: Diagnosis not present

## 2021-06-20 DIAGNOSIS — G9589 Other specified diseases of spinal cord: Secondary | ICD-10-CM | POA: Diagnosis not present

## 2021-06-20 DIAGNOSIS — K760 Fatty (change of) liver, not elsewhere classified: Secondary | ICD-10-CM | POA: Diagnosis not present

## 2021-06-20 DIAGNOSIS — F32A Depression, unspecified: Secondary | ICD-10-CM | POA: Diagnosis not present

## 2021-06-20 DIAGNOSIS — K259 Gastric ulcer, unspecified as acute or chronic, without hemorrhage or perforation: Secondary | ICD-10-CM | POA: Diagnosis not present

## 2021-06-20 DIAGNOSIS — E538 Deficiency of other specified B group vitamins: Secondary | ICD-10-CM | POA: Diagnosis not present

## 2021-06-20 DIAGNOSIS — E559 Vitamin D deficiency, unspecified: Secondary | ICD-10-CM | POA: Diagnosis not present

## 2021-06-20 DIAGNOSIS — R350 Frequency of micturition: Secondary | ICD-10-CM | POA: Diagnosis not present

## 2021-06-20 DIAGNOSIS — W19XXXD Unspecified fall, subsequent encounter: Secondary | ICD-10-CM | POA: Diagnosis not present

## 2021-06-20 DIAGNOSIS — M300 Polyarteritis nodosa: Secondary | ICD-10-CM | POA: Diagnosis not present

## 2021-06-20 DIAGNOSIS — M797 Fibromyalgia: Secondary | ICD-10-CM | POA: Diagnosis not present

## 2021-06-20 DIAGNOSIS — I10 Essential (primary) hypertension: Secondary | ICD-10-CM | POA: Diagnosis not present

## 2021-06-20 DIAGNOSIS — Z9181 History of falling: Secondary | ICD-10-CM | POA: Diagnosis not present

## 2021-06-20 DIAGNOSIS — G43909 Migraine, unspecified, not intractable, without status migrainosus: Secondary | ICD-10-CM | POA: Diagnosis not present

## 2021-06-20 DIAGNOSIS — G629 Polyneuropathy, unspecified: Secondary | ICD-10-CM | POA: Diagnosis not present

## 2021-06-20 DIAGNOSIS — L989 Disorder of the skin and subcutaneous tissue, unspecified: Secondary | ICD-10-CM | POA: Diagnosis not present

## 2021-06-21 ENCOUNTER — Other Ambulatory Visit: Payer: Self-pay | Admitting: Family Medicine

## 2021-06-22 ENCOUNTER — Ambulatory Visit: Payer: Self-pay | Admitting: Neurology

## 2021-06-22 ENCOUNTER — Telehealth: Payer: Self-pay | Admitting: Neurology

## 2021-06-22 ENCOUNTER — Encounter: Payer: Self-pay | Admitting: Neurology

## 2021-06-22 DIAGNOSIS — G43909 Migraine, unspecified, not intractable, without status migrainosus: Secondary | ICD-10-CM | POA: Diagnosis not present

## 2021-06-22 DIAGNOSIS — N390 Urinary tract infection, site not specified: Secondary | ICD-10-CM | POA: Diagnosis not present

## 2021-06-22 DIAGNOSIS — F32A Depression, unspecified: Secondary | ICD-10-CM | POA: Diagnosis not present

## 2021-06-22 DIAGNOSIS — M5136 Other intervertebral disc degeneration, lumbar region: Secondary | ICD-10-CM | POA: Diagnosis not present

## 2021-06-22 DIAGNOSIS — G4733 Obstructive sleep apnea (adult) (pediatric): Secondary | ICD-10-CM | POA: Diagnosis not present

## 2021-06-22 DIAGNOSIS — I872 Venous insufficiency (chronic) (peripheral): Secondary | ICD-10-CM | POA: Diagnosis not present

## 2021-06-22 DIAGNOSIS — G629 Polyneuropathy, unspecified: Secondary | ICD-10-CM | POA: Diagnosis not present

## 2021-06-22 DIAGNOSIS — R238 Other skin changes: Secondary | ICD-10-CM | POA: Diagnosis not present

## 2021-06-22 DIAGNOSIS — G9529 Other cord compression: Secondary | ICD-10-CM | POA: Diagnosis not present

## 2021-06-22 DIAGNOSIS — M4802 Spinal stenosis, cervical region: Secondary | ICD-10-CM | POA: Diagnosis not present

## 2021-06-22 DIAGNOSIS — E538 Deficiency of other specified B group vitamins: Secondary | ICD-10-CM | POA: Diagnosis not present

## 2021-06-22 DIAGNOSIS — I10 Essential (primary) hypertension: Secondary | ICD-10-CM | POA: Diagnosis not present

## 2021-06-22 DIAGNOSIS — R350 Frequency of micturition: Secondary | ICD-10-CM | POA: Diagnosis not present

## 2021-06-22 DIAGNOSIS — W19XXXD Unspecified fall, subsequent encounter: Secondary | ICD-10-CM | POA: Diagnosis not present

## 2021-06-22 DIAGNOSIS — Z9181 History of falling: Secondary | ICD-10-CM | POA: Diagnosis not present

## 2021-06-22 DIAGNOSIS — G9589 Other specified diseases of spinal cord: Secondary | ICD-10-CM | POA: Diagnosis not present

## 2021-06-22 DIAGNOSIS — M300 Polyarteritis nodosa: Secondary | ICD-10-CM | POA: Diagnosis not present

## 2021-06-22 DIAGNOSIS — E785 Hyperlipidemia, unspecified: Secondary | ICD-10-CM | POA: Diagnosis not present

## 2021-06-22 DIAGNOSIS — K760 Fatty (change of) liver, not elsewhere classified: Secondary | ICD-10-CM | POA: Diagnosis not present

## 2021-06-22 DIAGNOSIS — L989 Disorder of the skin and subcutaneous tissue, unspecified: Secondary | ICD-10-CM | POA: Diagnosis not present

## 2021-06-22 DIAGNOSIS — K219 Gastro-esophageal reflux disease without esophagitis: Secondary | ICD-10-CM | POA: Diagnosis not present

## 2021-06-22 DIAGNOSIS — M797 Fibromyalgia: Secondary | ICD-10-CM | POA: Diagnosis not present

## 2021-06-22 DIAGNOSIS — K259 Gastric ulcer, unspecified as acute or chronic, without hemorrhage or perforation: Secondary | ICD-10-CM | POA: Diagnosis not present

## 2021-06-22 DIAGNOSIS — E559 Vitamin D deficiency, unspecified: Secondary | ICD-10-CM | POA: Diagnosis not present

## 2021-06-22 NOTE — Telephone Encounter (Signed)
Pt cancelling appt due to problem with public transportation transporting pt.

## 2021-06-22 NOTE — Telephone Encounter (Incomplete Revision)
Pt would like a call from the nurse to discuss a sooner appt than 08/10/21. Having problems with transportation and missed appt on 06/22/21.   Please call this temporary number: [phone]

## 2021-06-22 NOTE — Telephone Encounter (Signed)
[employer] [facility]) there was miscommunication with transportation company to give her a ride to the appt.

## 2021-06-22 NOTE — Telephone Encounter (Signed)
Ivar Drape in our referral dept attempted to reach the patient. No answer and unable to leave message. Hopefully, she will see a missed call.  When she calls back, she may be scheduled with any appropriate provider. Please also review our office "no show" policy.

## 2021-06-22 NOTE — Telephone Encounter (Addendum)
Pt would like a call from the nurse to discuss a sooner appt than 08/10/21. Having problems with transportation and missed appt on 06/22/21.   Please call this temporary number: [phone]

## 2021-06-23 ENCOUNTER — Telehealth: Payer: Self-pay

## 2021-06-23 DIAGNOSIS — G43909 Migraine, unspecified, not intractable, without status migrainosus: Secondary | ICD-10-CM | POA: Diagnosis not present

## 2021-06-23 DIAGNOSIS — M5136 Other intervertebral disc degeneration, lumbar region: Secondary | ICD-10-CM | POA: Diagnosis not present

## 2021-06-23 DIAGNOSIS — E538 Deficiency of other specified B group vitamins: Secondary | ICD-10-CM | POA: Diagnosis not present

## 2021-06-23 DIAGNOSIS — R238 Other skin changes: Secondary | ICD-10-CM | POA: Diagnosis not present

## 2021-06-23 DIAGNOSIS — L989 Disorder of the skin and subcutaneous tissue, unspecified: Secondary | ICD-10-CM | POA: Diagnosis not present

## 2021-06-23 DIAGNOSIS — M300 Polyarteritis nodosa: Secondary | ICD-10-CM | POA: Diagnosis not present

## 2021-06-23 DIAGNOSIS — K760 Fatty (change of) liver, not elsewhere classified: Secondary | ICD-10-CM | POA: Diagnosis not present

## 2021-06-23 DIAGNOSIS — E559 Vitamin D deficiency, unspecified: Secondary | ICD-10-CM | POA: Diagnosis not present

## 2021-06-23 DIAGNOSIS — E785 Hyperlipidemia, unspecified: Secondary | ICD-10-CM | POA: Diagnosis not present

## 2021-06-23 DIAGNOSIS — G9529 Other cord compression: Secondary | ICD-10-CM | POA: Diagnosis not present

## 2021-06-23 DIAGNOSIS — W19XXXD Unspecified fall, subsequent encounter: Secondary | ICD-10-CM | POA: Diagnosis not present

## 2021-06-23 DIAGNOSIS — N390 Urinary tract infection, site not specified: Secondary | ICD-10-CM | POA: Diagnosis not present

## 2021-06-23 DIAGNOSIS — Z9181 History of falling: Secondary | ICD-10-CM | POA: Diagnosis not present

## 2021-06-23 DIAGNOSIS — G4733 Obstructive sleep apnea (adult) (pediatric): Secondary | ICD-10-CM | POA: Diagnosis not present

## 2021-06-23 DIAGNOSIS — F32A Depression, unspecified: Secondary | ICD-10-CM | POA: Diagnosis not present

## 2021-06-23 DIAGNOSIS — I872 Venous insufficiency (chronic) (peripheral): Secondary | ICD-10-CM | POA: Diagnosis not present

## 2021-06-23 DIAGNOSIS — K259 Gastric ulcer, unspecified as acute or chronic, without hemorrhage or perforation: Secondary | ICD-10-CM | POA: Diagnosis not present

## 2021-06-23 DIAGNOSIS — G9589 Other specified diseases of spinal cord: Secondary | ICD-10-CM | POA: Diagnosis not present

## 2021-06-23 DIAGNOSIS — R350 Frequency of micturition: Secondary | ICD-10-CM | POA: Diagnosis not present

## 2021-06-23 DIAGNOSIS — G629 Polyneuropathy, unspecified: Secondary | ICD-10-CM | POA: Diagnosis not present

## 2021-06-23 DIAGNOSIS — M4802 Spinal stenosis, cervical region: Secondary | ICD-10-CM | POA: Diagnosis not present

## 2021-06-23 DIAGNOSIS — M797 Fibromyalgia: Secondary | ICD-10-CM | POA: Diagnosis not present

## 2021-06-23 DIAGNOSIS — I10 Essential (primary) hypertension: Secondary | ICD-10-CM | POA: Diagnosis not present

## 2021-06-23 DIAGNOSIS — K219 Gastro-esophageal reflux disease without esophagitis: Secondary | ICD-10-CM | POA: Diagnosis not present

## 2021-06-23 NOTE — Telephone Encounter (Signed)
Ok to provider orders for in and out cath and urine culture.

## 2021-06-23 NOTE — Telephone Encounter (Signed)
Caller name:[employer] scientist (life sciences) ([place])   On DPR? :Yes  Call back number:[phone]  Provider they see: Birdie Riddle   Reason for call: in out catheter for UA and Culture she has fowls smell.  Drop visit to once week all places have improved on wound and are just scabs

## 2021-06-23 NOTE — Telephone Encounter (Signed)
Verbal orders were given to [facility]

## 2021-06-24 ENCOUNTER — Inpatient Hospital Stay: Payer: Medicare Other | Admitting: Family Medicine

## 2021-06-25 DIAGNOSIS — G43909 Migraine, unspecified, not intractable, without status migrainosus: Secondary | ICD-10-CM | POA: Diagnosis not present

## 2021-06-25 DIAGNOSIS — Z9181 History of falling: Secondary | ICD-10-CM | POA: Diagnosis not present

## 2021-06-25 DIAGNOSIS — K760 Fatty (change of) liver, not elsewhere classified: Secondary | ICD-10-CM | POA: Diagnosis not present

## 2021-06-25 DIAGNOSIS — M4802 Spinal stenosis, cervical region: Secondary | ICD-10-CM | POA: Diagnosis not present

## 2021-06-25 DIAGNOSIS — R238 Other skin changes: Secondary | ICD-10-CM | POA: Diagnosis not present

## 2021-06-25 DIAGNOSIS — M300 Polyarteritis nodosa: Secondary | ICD-10-CM | POA: Diagnosis not present

## 2021-06-25 DIAGNOSIS — K219 Gastro-esophageal reflux disease without esophagitis: Secondary | ICD-10-CM | POA: Diagnosis not present

## 2021-06-25 DIAGNOSIS — E538 Deficiency of other specified B group vitamins: Secondary | ICD-10-CM | POA: Diagnosis not present

## 2021-06-25 DIAGNOSIS — M5136 Other intervertebral disc degeneration, lumbar region: Secondary | ICD-10-CM | POA: Diagnosis not present

## 2021-06-25 DIAGNOSIS — G9529 Other cord compression: Secondary | ICD-10-CM | POA: Diagnosis not present

## 2021-06-25 DIAGNOSIS — N390 Urinary tract infection, site not specified: Secondary | ICD-10-CM | POA: Diagnosis not present

## 2021-06-25 DIAGNOSIS — R350 Frequency of micturition: Secondary | ICD-10-CM | POA: Diagnosis not present

## 2021-06-25 DIAGNOSIS — M797 Fibromyalgia: Secondary | ICD-10-CM | POA: Diagnosis not present

## 2021-06-25 DIAGNOSIS — I10 Essential (primary) hypertension: Secondary | ICD-10-CM | POA: Diagnosis not present

## 2021-06-25 DIAGNOSIS — G4733 Obstructive sleep apnea (adult) (pediatric): Secondary | ICD-10-CM | POA: Diagnosis not present

## 2021-06-25 DIAGNOSIS — G629 Polyneuropathy, unspecified: Secondary | ICD-10-CM | POA: Diagnosis not present

## 2021-06-25 DIAGNOSIS — G9589 Other specified diseases of spinal cord: Secondary | ICD-10-CM | POA: Diagnosis not present

## 2021-06-25 DIAGNOSIS — E559 Vitamin D deficiency, unspecified: Secondary | ICD-10-CM | POA: Diagnosis not present

## 2021-06-25 DIAGNOSIS — K259 Gastric ulcer, unspecified as acute or chronic, without hemorrhage or perforation: Secondary | ICD-10-CM | POA: Diagnosis not present

## 2021-06-25 DIAGNOSIS — W19XXXD Unspecified fall, subsequent encounter: Secondary | ICD-10-CM | POA: Diagnosis not present

## 2021-06-25 DIAGNOSIS — E785 Hyperlipidemia, unspecified: Secondary | ICD-10-CM | POA: Diagnosis not present

## 2021-06-25 DIAGNOSIS — L989 Disorder of the skin and subcutaneous tissue, unspecified: Secondary | ICD-10-CM | POA: Diagnosis not present

## 2021-06-25 DIAGNOSIS — F32A Depression, unspecified: Secondary | ICD-10-CM | POA: Diagnosis not present

## 2021-06-25 DIAGNOSIS — I872 Venous insufficiency (chronic) (peripheral): Secondary | ICD-10-CM | POA: Diagnosis not present

## 2021-06-28 ENCOUNTER — Telehealth (INDEPENDENT_AMBULATORY_CARE_PROVIDER_SITE_OTHER): Payer: Medicare Other | Admitting: Family Medicine

## 2021-06-28 ENCOUNTER — Encounter: Payer: Self-pay | Admitting: Family Medicine

## 2021-06-28 DIAGNOSIS — G9589 Other specified diseases of spinal cord: Secondary | ICD-10-CM | POA: Diagnosis not present

## 2021-06-28 DIAGNOSIS — E531 Pyridoxine deficiency: Secondary | ICD-10-CM

## 2021-06-28 DIAGNOSIS — E559 Vitamin D deficiency, unspecified: Secondary | ICD-10-CM | POA: Diagnosis not present

## 2021-06-28 DIAGNOSIS — E538 Deficiency of other specified B group vitamins: Secondary | ICD-10-CM

## 2021-06-28 DIAGNOSIS — L89159 Pressure ulcer of sacral region, unspecified stage: Secondary | ICD-10-CM | POA: Diagnosis not present

## 2021-06-28 NOTE — Progress Notes (Signed)
Virtual Visit via Video   I connected with patient on 06/28/21 at  1:00 PM EST by a video enabled telemedicine application and verified that I am speaking with the correct person using two identifiers.  Location patient: Home Location provider: [facility], Office Persons participating in the virtual visit: Patient, Provider, [place] Claiborne Billings C)  I discussed the limitations of evaluation and management by telemedicine and the availability of in person appointments. The patient expressed understanding and agreed to proceed.  Subjective:   HPI:   Hospital f/u- pt was admitted 1/20-1/25 w/ Myelomalacia of cervical cord.  She presented w/ progressive lower extremity weakness.  MRI showed moderate to severe spinal stenosis C3-4 thru C6-7.  Per neurosurgery she will eventually need 3 level ACDF but she is to f/u outpt.  She was found to be deficient in B12, B6, Vit D, and folate.  Started on vitamin replacements.  Pt reports pressure sores are doing well- she has [facility] RN  'I'm so much better than I was'.  Today pt reports she is now able to be up and in a wheelchair.  She is working w/ [facility] PT.  The goal is to work on standing at next visit.  She reports she is now able to feel her hands and feet.  ROS:   See pertinent positives and negatives per HPI.  Patient Active Problem List   Diagnosis Date Noted   Vitamin B6 deficiency 06/08/2021   Myelomalacia of cervical cord ([place]) 06/06/2021   Compressive cervical cord myelomalacia ([place]) 06/05/2021   B12 deficiency 06/05/2021   Folate deficiency 06/05/2021   Vitamin D deficiency 06/05/2021   Leg weakness, bilateral 06/04/2021   Cervical atypia 01/06/2021   Steatosis of liver 01/06/2021   Hyponatremia 07/27/2019   Urinary urgency 04/04/2018   Chronic narcotic use 09/07/2016   Acute blood loss anemia 01/14/2016   Multiple gastric ulcers    PAN (polyarteritis nodosa) ([place]) 11/24/2015   GERD (gastroesophageal reflux disease) 01/08/2014    Routine general medical examination at a health care facility 04/23/2013   Bariatric surgery status 10/31/2012   S/P skin biopsy 10/31/2012   Psoriasis 01/09/2012   DDD (degenerative disc disease), lumbar 11/30/2011   Migraine headache    [name] (obstructive sleep apnea) 11/16/2010   HYPERGLYCEMIA, FASTING 10/08/2009   CONTRACTURE OF TENDON 08/17/2009   PARESTHESIA, HANDS 12/23/2008   Leg pain, right 11/05/2008   ADVERSE DRUG REACTION 04/03/2008   PERIPHERAL EDEMA 03/26/2008   Obesity, Class III, BMI 40-49.9 (morbid obesity) ([place]) 01/03/2007   Cellulitis 01/03/2007   Hyperlipidemia 09/22/2006   Fibromyalgia 09/20/2006   Anxiety and depression 06/28/2006   Essential hypertension 06/28/2006    Social History   Tobacco Use   Smoking status: Never   Smokeless tobacco: Never  Substance Use Topics   Alcohol use: No    Current Outpatient Medications:    ALPRAZolam (XANAX) 0.5 MG tablet, Take 1 tablet (0.5 mg total) by mouth 2 (two) times daily., Disp: 30 tablet, Rfl: 3   ARIPiprazole (ABILIFY) 10 MG tablet, Take 10 mg by mouth in the morning. , Disp: , Rfl:    Armodafinil 250 MG tablet, Take 250 mg by mouth 3 (three) times daily. 8am, 2pm, 6pm, Disp: , Rfl:    Biotin 10 MG TABS, Take 10 mg by mouth in the morning. , Disp: , Rfl:    buPROPion (WELLBUTRIN XL) 300 MG 24 hr tablet, TAKE 1 TABLET BY MOUTH EVERY DAY (Patient taking differently: 300 mg daily.), Disp: 30  tablet, Rfl: 2   busPIRone (BUSPAR) 30 MG tablet, TAKE 1 TABLET BY MOUTH 2 TIMES DAILY (Patient taking differently: Take 30 mg by mouth 2 (two) times daily.), Disp: 60 tablet, Rfl: 2   calcium citrate (CALCITRATE - DOSED IN MG ELEMENTAL CALCIUM) 950 MG tablet, Take 1 tablet by mouth daily., Disp: , Rfl:    clonazePAM (KLONOPIN) 1 MG tablet, TAKE 1 TABLET BY MOUTH AT BEDTIME (Patient taking differently: Take 1 mg by mouth daily.), Disp: 30 tablet, Rfl: 3   collagenase (SANTYL) ointment, Apply topically daily. apply Santyl to scabs  on legs (Patient taking differently: Apply 1 application topically daily. apply Santyl to scabs on legs), Disp: 15 g, Rfl: 0   cyclobenzaprine (FLEXERIL) 10 MG tablet, TAKE 1 TABLET BY MOUTH 3 TIMES DAILY AS NEEDED (Patient taking differently: Take 10 mg by mouth 2 (two) times daily.), Disp: 270 tablet, Rfl: 2   diclofenac Sodium (VOLTAREN) 1 % GEL, Apply 4 g topically 4 (four) times daily., Disp: , Rfl:    diphenhydrAMINE (BENADRYL) 25 MG tablet, Take 25 mg by mouth every 6 (six) hours as needed for allergies., Disp: , Rfl:    DULoxetine (CYMBALTA) 60 MG capsule, Take 2 capsules (120 mg total) by mouth daily. (Patient taking differently: Take 60 mg by mouth 2 (two) times daily.), Disp: 240 capsule, Rfl: 2   FEROSUL 325 (65 Fe) MG tablet, TAKE 1 TABLET BY MOUTH EVERY DAY WITH BREAKFAST (Patient taking differently: Take 325 mg by mouth daily with breakfast.), Disp: 30 tablet, Rfl: 2   folic acid (FOLVITE) 1 MG tablet, Take 1 tablet (1 mg total) by mouth daily., Disp: 30 tablet, Rfl: 2   furosemide (LASIX) 40 MG tablet, TAKE 1 TABLET BY MOUTH EVERY DAY MAY take additional TABLET IF weight INCREASE by 5lb AND call MD FOR further instructions (Patient taking differently: Take 40 mg by mouth daily. 1t qd-may take an additional tablet if weight increases by 5lbs.), Disp: 30 tablet, Rfl: 1   gabapentin (NEURONTIN) 600 MG tablet, Take 1 tablet (600 mg total) by mouth 3 (three) times daily. TAKE 2 TABLETS BY MOUTH 3 TIMES DAILY (Patient taking differently: Take 600-1,200 mg by mouth 3 (three) times daily. Takes 1 tablet in the morning and afternoon and 2 tablets at night.), Disp: 270 tablet, Rfl: 0   HYDROcodone-acetaminophen (NORCO) 10-325 MG tablet, TAKE 1 TABLET BY MOUTH EVERY 6 HOURS AS NEEDED, Disp: 90 tablet, Rfl: 0   nebivolol (BYSTOLIC) 10 MG tablet, TAKE 1 TABLET BY MOUTH EVERY DAY (Patient taking differently: Take 10 mg by mouth daily.), Disp: 30 tablet, Rfl: 2   Oxcarbazepine (TRILEPTAL) 300 MG tablet,  Take 300 mg by mouth 2 (two) times daily., Disp: , Rfl:    pantoprazole (PROTONIX) 40 MG tablet, TAKE 1 TABLET BY MOUTH 2 TIMES DAILY (Patient taking differently: 40 mg 2 (two) times daily before a meal.), Disp: 60 tablet, Rfl: 2   phentermine 37.5 MG capsule, TAKE 1 CAPSULE BY MOUTH EVERY MORNING (Patient taking differently: Take 37.5 mg by mouth daily.), Disp: 90 capsule, Rfl: 0   promethazine (PHENERGAN) 25 MG tablet, TAKE 1 TABLET BY MOUTH EVERY 6 HOURS AS NEEDED FOR NAUSEA AND vomiting, Disp: 45 tablet, Rfl: 3   pyridOXINE (B-6) 100 MG tablet, Take 1 tablet (100 mg total) by mouth daily., Disp: 30 tablet, Rfl: 2   rosuvastatin (CRESTOR) 20 MG tablet, TAKE 1 TABLET BY MOUTH EVERY DAY (Patient taking differently: Take 20 mg by mouth daily.), Disp: 90  tablet, Rfl: 2   senna-docusate (SENOKOT-S) 8.6-50 MG tablet, Take 1 tablet by mouth at bedtime. (Patient taking differently: Take 1 tablet by mouth daily as needed for mild constipation.), Disp: 30 tablet, Rfl: 0   SKYRIZI 150 MG/ML SOSY, Inject 150 mg into the skin every 3 (three) months., Disp: , Rfl:    SUMAtriptan (IMITREX) 100 MG tablet, TAKE 1 TABLET BY MOUTH AT migraine ONSET, MAY REPEAT in 2 hours IF HEADACHE persists OR recurs (Patient taking differently: Take 100 mg by mouth every 2 (two) hours as needed for migraine.), Disp: 10 tablet, Rfl: 1   thiamine 100 MG tablet, Take 1 tablet (100 mg total) by mouth daily., Disp: 30 tablet, Rfl: 2   vitamin B-12 (CYANOCOBALAMIN) 1000 MCG tablet, Take 1 tablet (1,000 mcg total) by mouth daily., Disp: 30 tablet, Rfl: 2   pentoxifylline (TRENTAL) 400 MG CR tablet, Take 400 mg by mouth 3 (three) times daily with meals. (Patient not taking: Reported on 06/28/2021), Disp: , Rfl: 11  Allergies  Allergen Reactions   Niacin Anaphylaxis, Shortness Of Breath and Swelling    Swelling and "problems breathing"   Sulfamethoxazole-Trimethoprim Anaphylaxis, Swelling, Rash and Other (See Comments)    Throat closed  and the eyes became swollen   Aspirin Hives   Bactrim Hives   Benzoin Compound Rash   Cephalexin Hives    Tolerated ceftriaxone and cefepime 07/2019   Clindamycin/Lincomycin Hives   Iohexol Hives    Pt treated with PO benedryl   Lisinopril Hives   Naproxen Hives   Pnu-Imune [Pneumococcal Polysaccharide Vaccine] Rash   Sulfonamide Derivatives Rash    Objective:   There were no vitals taken for this visit. AAOx3, NAD NCAT, EOMI No obvious CN deficits Coloring WNL Pt is able to speak clearly, coherently without shortness of breath or increased work of breathing.  Thought process is linear.  Mood is appropriate.   Assessment and Plan:   Myelomalacia- new.  Pt met w/ Neurosurgery during her hospitalization and at this time, there is no plan to operate.  Pt is working w/ PT to regain strength in her legs and return to standing.  At this time, she is able to transfer from bed to chair but otherwise movement is very limited.  Will continue to follow.  Multiple Vitamin Deficiencies- new.  Given the fact that pt had bariatric surgery x2 she was likely not absorbing her nutrients as she should have otherwise been.  She is now on Vitamin supplementation and reports she is now able to feel her hands and feet.  She is very pleased w/ the results.  Pressure sore- new.  I am not able to view this today but pt states the [facility] RN reports that this is doing well.  Stressed the need to keep area clean and dry.  Pt is aware.   [name], MD 06/28/2021

## 2021-06-29 ENCOUNTER — Telehealth: Payer: Self-pay

## 2021-06-29 DIAGNOSIS — K219 Gastro-esophageal reflux disease without esophagitis: Secondary | ICD-10-CM | POA: Diagnosis not present

## 2021-06-29 DIAGNOSIS — E785 Hyperlipidemia, unspecified: Secondary | ICD-10-CM | POA: Diagnosis not present

## 2021-06-29 DIAGNOSIS — N390 Urinary tract infection, site not specified: Secondary | ICD-10-CM | POA: Diagnosis not present

## 2021-06-29 DIAGNOSIS — K760 Fatty (change of) liver, not elsewhere classified: Secondary | ICD-10-CM | POA: Diagnosis not present

## 2021-06-29 DIAGNOSIS — M4802 Spinal stenosis, cervical region: Secondary | ICD-10-CM | POA: Diagnosis not present

## 2021-06-29 DIAGNOSIS — M300 Polyarteritis nodosa: Secondary | ICD-10-CM | POA: Diagnosis not present

## 2021-06-29 DIAGNOSIS — G9529 Other cord compression: Secondary | ICD-10-CM | POA: Diagnosis not present

## 2021-06-29 DIAGNOSIS — G4733 Obstructive sleep apnea (adult) (pediatric): Secondary | ICD-10-CM | POA: Diagnosis not present

## 2021-06-29 DIAGNOSIS — L989 Disorder of the skin and subcutaneous tissue, unspecified: Secondary | ICD-10-CM | POA: Diagnosis not present

## 2021-06-29 DIAGNOSIS — F32A Depression, unspecified: Secondary | ICD-10-CM | POA: Diagnosis not present

## 2021-06-29 DIAGNOSIS — I10 Essential (primary) hypertension: Secondary | ICD-10-CM | POA: Diagnosis not present

## 2021-06-29 DIAGNOSIS — E538 Deficiency of other specified B group vitamins: Secondary | ICD-10-CM | POA: Diagnosis not present

## 2021-06-29 DIAGNOSIS — R238 Other skin changes: Secondary | ICD-10-CM | POA: Diagnosis not present

## 2021-06-29 DIAGNOSIS — E559 Vitamin D deficiency, unspecified: Secondary | ICD-10-CM | POA: Diagnosis not present

## 2021-06-29 DIAGNOSIS — I872 Venous insufficiency (chronic) (peripheral): Secondary | ICD-10-CM | POA: Diagnosis not present

## 2021-06-29 DIAGNOSIS — M797 Fibromyalgia: Secondary | ICD-10-CM | POA: Diagnosis not present

## 2021-06-29 DIAGNOSIS — R350 Frequency of micturition: Secondary | ICD-10-CM | POA: Diagnosis not present

## 2021-06-29 DIAGNOSIS — G629 Polyneuropathy, unspecified: Secondary | ICD-10-CM | POA: Diagnosis not present

## 2021-06-29 DIAGNOSIS — G9589 Other specified diseases of spinal cord: Secondary | ICD-10-CM | POA: Diagnosis not present

## 2021-06-29 DIAGNOSIS — W19XXXD Unspecified fall, subsequent encounter: Secondary | ICD-10-CM | POA: Diagnosis not present

## 2021-06-29 DIAGNOSIS — K259 Gastric ulcer, unspecified as acute or chronic, without hemorrhage or perforation: Secondary | ICD-10-CM | POA: Diagnosis not present

## 2021-06-29 DIAGNOSIS — M5136 Other intervertebral disc degeneration, lumbar region: Secondary | ICD-10-CM | POA: Diagnosis not present

## 2021-06-29 DIAGNOSIS — G43909 Migraine, unspecified, not intractable, without status migrainosus: Secondary | ICD-10-CM | POA: Diagnosis not present

## 2021-06-29 DIAGNOSIS — Z9181 History of falling: Secondary | ICD-10-CM | POA: Diagnosis not present

## 2021-06-29 NOTE — Telephone Encounter (Signed)
Pt home health nurse was told by pt had labs needed to be drawn, no orders in system, is there something you would like for her to draw next time she is with the patient?

## 2021-06-29 NOTE — Telephone Encounter (Signed)
Pt needs CBC w/ diff and CMP (dx morbid obesity).  Can give verbal order for home health to draw labs.

## 2021-06-29 NOTE — Telephone Encounter (Signed)
Caller name:[name] with [facility]   On DPR? :Yes  Call back [phone]  Provider they see: Birdie Riddle   Reason for call:[name] at [place] was at the home today and Myasia mention having labs done I do not see nothing in notes or future order and [name] is wanting to know if there is labs that she can draw?

## 2021-06-29 NOTE — Addendum Note (Signed)
Addended by: Patrcia Dolly on: 06/29/2021 02:41 PM   Modules accepted: Orders

## 2021-06-29 NOTE — Telephone Encounter (Signed)
Called back and gave verbal orders

## 2021-06-30 ENCOUNTER — Encounter: Payer: Self-pay | Admitting: Family Medicine

## 2021-06-30 DIAGNOSIS — I872 Venous insufficiency (chronic) (peripheral): Secondary | ICD-10-CM | POA: Diagnosis not present

## 2021-06-30 DIAGNOSIS — R238 Other skin changes: Secondary | ICD-10-CM | POA: Diagnosis not present

## 2021-06-30 DIAGNOSIS — I10 Essential (primary) hypertension: Secondary | ICD-10-CM | POA: Diagnosis not present

## 2021-06-30 DIAGNOSIS — M300 Polyarteritis nodosa: Secondary | ICD-10-CM | POA: Diagnosis not present

## 2021-06-30 DIAGNOSIS — M797 Fibromyalgia: Secondary | ICD-10-CM | POA: Diagnosis not present

## 2021-06-30 DIAGNOSIS — M4802 Spinal stenosis, cervical region: Secondary | ICD-10-CM | POA: Diagnosis not present

## 2021-06-30 DIAGNOSIS — W19XXXD Unspecified fall, subsequent encounter: Secondary | ICD-10-CM | POA: Diagnosis not present

## 2021-06-30 DIAGNOSIS — K219 Gastro-esophageal reflux disease without esophagitis: Secondary | ICD-10-CM | POA: Diagnosis not present

## 2021-06-30 DIAGNOSIS — Z9181 History of falling: Secondary | ICD-10-CM | POA: Diagnosis not present

## 2021-06-30 DIAGNOSIS — E538 Deficiency of other specified B group vitamins: Secondary | ICD-10-CM | POA: Diagnosis not present

## 2021-06-30 DIAGNOSIS — K259 Gastric ulcer, unspecified as acute or chronic, without hemorrhage or perforation: Secondary | ICD-10-CM | POA: Diagnosis not present

## 2021-06-30 DIAGNOSIS — F32A Depression, unspecified: Secondary | ICD-10-CM | POA: Diagnosis not present

## 2021-06-30 DIAGNOSIS — L989 Disorder of the skin and subcutaneous tissue, unspecified: Secondary | ICD-10-CM | POA: Diagnosis not present

## 2021-06-30 DIAGNOSIS — N390 Urinary tract infection, site not specified: Secondary | ICD-10-CM | POA: Diagnosis not present

## 2021-06-30 DIAGNOSIS — K760 Fatty (change of) liver, not elsewhere classified: Secondary | ICD-10-CM | POA: Diagnosis not present

## 2021-06-30 DIAGNOSIS — G9589 Other specified diseases of spinal cord: Secondary | ICD-10-CM | POA: Diagnosis not present

## 2021-06-30 DIAGNOSIS — M5136 Other intervertebral disc degeneration, lumbar region: Secondary | ICD-10-CM | POA: Diagnosis not present

## 2021-06-30 DIAGNOSIS — R350 Frequency of micturition: Secondary | ICD-10-CM | POA: Diagnosis not present

## 2021-06-30 DIAGNOSIS — E559 Vitamin D deficiency, unspecified: Secondary | ICD-10-CM | POA: Diagnosis not present

## 2021-06-30 DIAGNOSIS — E785 Hyperlipidemia, unspecified: Secondary | ICD-10-CM | POA: Diagnosis not present

## 2021-06-30 DIAGNOSIS — G4733 Obstructive sleep apnea (adult) (pediatric): Secondary | ICD-10-CM | POA: Diagnosis not present

## 2021-06-30 DIAGNOSIS — G43909 Migraine, unspecified, not intractable, without status migrainosus: Secondary | ICD-10-CM | POA: Diagnosis not present

## 2021-06-30 DIAGNOSIS — G629 Polyneuropathy, unspecified: Secondary | ICD-10-CM | POA: Diagnosis not present

## 2021-06-30 DIAGNOSIS — G9529 Other cord compression: Secondary | ICD-10-CM | POA: Diagnosis not present

## 2021-07-02 ENCOUNTER — Telehealth: Payer: Self-pay | Admitting: Family Medicine

## 2021-07-02 ENCOUNTER — Telehealth: Payer: Self-pay

## 2021-07-02 NOTE — Telephone Encounter (Signed)
[name] called in stating that pt wants to cancel her PT appt today due to not feeling well. For questions please call Cecilia at [phone]

## 2021-07-02 NOTE — Telephone Encounter (Signed)
Caller name:[name]   On DPR? :Yes  Call back [place]  Provider they see: Birdie Riddle   Reason for call:Pt is calling about the results of her lab work because she is not able to get in to her myhart. Lab work was done on 06/29/21 in the system it still shows future and her home health nurse took her blood early in the week and sent to [facility]

## 2021-07-03 DIAGNOSIS — K760 Fatty (change of) liver, not elsewhere classified: Secondary | ICD-10-CM | POA: Diagnosis not present

## 2021-07-03 DIAGNOSIS — G9529 Other cord compression: Secondary | ICD-10-CM | POA: Diagnosis not present

## 2021-07-03 DIAGNOSIS — I10 Essential (primary) hypertension: Secondary | ICD-10-CM | POA: Diagnosis not present

## 2021-07-03 DIAGNOSIS — G9589 Other specified diseases of spinal cord: Secondary | ICD-10-CM | POA: Diagnosis not present

## 2021-07-03 DIAGNOSIS — G629 Polyneuropathy, unspecified: Secondary | ICD-10-CM | POA: Diagnosis not present

## 2021-07-03 DIAGNOSIS — N390 Urinary tract infection, site not specified: Secondary | ICD-10-CM | POA: Diagnosis not present

## 2021-07-03 DIAGNOSIS — G4733 Obstructive sleep apnea (adult) (pediatric): Secondary | ICD-10-CM | POA: Diagnosis not present

## 2021-07-03 DIAGNOSIS — E559 Vitamin D deficiency, unspecified: Secondary | ICD-10-CM | POA: Diagnosis not present

## 2021-07-03 DIAGNOSIS — R350 Frequency of micturition: Secondary | ICD-10-CM | POA: Diagnosis not present

## 2021-07-03 DIAGNOSIS — K219 Gastro-esophageal reflux disease without esophagitis: Secondary | ICD-10-CM | POA: Diagnosis not present

## 2021-07-03 DIAGNOSIS — E785 Hyperlipidemia, unspecified: Secondary | ICD-10-CM | POA: Diagnosis not present

## 2021-07-03 DIAGNOSIS — F32A Depression, unspecified: Secondary | ICD-10-CM | POA: Diagnosis not present

## 2021-07-03 DIAGNOSIS — Z9181 History of falling: Secondary | ICD-10-CM | POA: Diagnosis not present

## 2021-07-03 DIAGNOSIS — R238 Other skin changes: Secondary | ICD-10-CM | POA: Diagnosis not present

## 2021-07-03 DIAGNOSIS — I872 Venous insufficiency (chronic) (peripheral): Secondary | ICD-10-CM | POA: Diagnosis not present

## 2021-07-03 DIAGNOSIS — W19XXXD Unspecified fall, subsequent encounter: Secondary | ICD-10-CM | POA: Diagnosis not present

## 2021-07-03 DIAGNOSIS — M5136 Other intervertebral disc degeneration, lumbar region: Secondary | ICD-10-CM | POA: Diagnosis not present

## 2021-07-03 DIAGNOSIS — E538 Deficiency of other specified B group vitamins: Secondary | ICD-10-CM | POA: Diagnosis not present

## 2021-07-03 DIAGNOSIS — M4802 Spinal stenosis, cervical region: Secondary | ICD-10-CM | POA: Diagnosis not present

## 2021-07-03 DIAGNOSIS — L989 Disorder of the skin and subcutaneous tissue, unspecified: Secondary | ICD-10-CM | POA: Diagnosis not present

## 2021-07-03 DIAGNOSIS — K259 Gastric ulcer, unspecified as acute or chronic, without hemorrhage or perforation: Secondary | ICD-10-CM | POA: Diagnosis not present

## 2021-07-03 DIAGNOSIS — M300 Polyarteritis nodosa: Secondary | ICD-10-CM | POA: Diagnosis not present

## 2021-07-03 DIAGNOSIS — G43909 Migraine, unspecified, not intractable, without status migrainosus: Secondary | ICD-10-CM | POA: Diagnosis not present

## 2021-07-03 DIAGNOSIS — M797 Fibromyalgia: Secondary | ICD-10-CM | POA: Diagnosis not present

## 2021-07-05 ENCOUNTER — Telehealth: Payer: Self-pay

## 2021-07-05 DIAGNOSIS — K259 Gastric ulcer, unspecified as acute or chronic, without hemorrhage or perforation: Secondary | ICD-10-CM

## 2021-07-05 DIAGNOSIS — K760 Fatty (change of) liver, not elsewhere classified: Secondary | ICD-10-CM | POA: Diagnosis not present

## 2021-07-05 DIAGNOSIS — G629 Polyneuropathy, unspecified: Secondary | ICD-10-CM | POA: Diagnosis not present

## 2021-07-05 DIAGNOSIS — Z9181 History of falling: Secondary | ICD-10-CM

## 2021-07-05 DIAGNOSIS — I10 Essential (primary) hypertension: Secondary | ICD-10-CM

## 2021-07-05 DIAGNOSIS — M5136 Other intervertebral disc degeneration, lumbar region: Secondary | ICD-10-CM | POA: Diagnosis not present

## 2021-07-05 DIAGNOSIS — M4802 Spinal stenosis, cervical region: Secondary | ICD-10-CM | POA: Diagnosis not present

## 2021-07-05 DIAGNOSIS — R238 Other skin changes: Secondary | ICD-10-CM | POA: Diagnosis not present

## 2021-07-05 DIAGNOSIS — E559 Vitamin D deficiency, unspecified: Secondary | ICD-10-CM

## 2021-07-05 DIAGNOSIS — G43909 Migraine, unspecified, not intractable, without status migrainosus: Secondary | ICD-10-CM

## 2021-07-05 DIAGNOSIS — F32A Depression, unspecified: Secondary | ICD-10-CM

## 2021-07-05 DIAGNOSIS — Z993 Dependence on wheelchair: Secondary | ICD-10-CM

## 2021-07-05 DIAGNOSIS — M300 Polyarteritis nodosa: Secondary | ICD-10-CM | POA: Diagnosis not present

## 2021-07-05 DIAGNOSIS — F419 Anxiety disorder, unspecified: Secondary | ICD-10-CM

## 2021-07-05 DIAGNOSIS — E538 Deficiency of other specified B group vitamins: Secondary | ICD-10-CM | POA: Diagnosis not present

## 2021-07-05 DIAGNOSIS — G4733 Obstructive sleep apnea (adult) (pediatric): Secondary | ICD-10-CM

## 2021-07-05 DIAGNOSIS — I872 Venous insufficiency (chronic) (peripheral): Secondary | ICD-10-CM | POA: Diagnosis not present

## 2021-07-05 DIAGNOSIS — E785 Hyperlipidemia, unspecified: Secondary | ICD-10-CM

## 2021-07-05 DIAGNOSIS — K219 Gastro-esophageal reflux disease without esophagitis: Secondary | ICD-10-CM

## 2021-07-05 DIAGNOSIS — R350 Frequency of micturition: Secondary | ICD-10-CM

## 2021-07-05 DIAGNOSIS — G9589 Other specified diseases of spinal cord: Secondary | ICD-10-CM | POA: Diagnosis not present

## 2021-07-05 DIAGNOSIS — L989 Disorder of the skin and subcutaneous tissue, unspecified: Secondary | ICD-10-CM | POA: Diagnosis not present

## 2021-07-05 DIAGNOSIS — M797 Fibromyalgia: Secondary | ICD-10-CM | POA: Diagnosis not present

## 2021-07-05 DIAGNOSIS — G9529 Other cord compression: Secondary | ICD-10-CM | POA: Diagnosis not present

## 2021-07-05 DIAGNOSIS — N390 Urinary tract infection, site not specified: Secondary | ICD-10-CM

## 2021-07-05 NOTE — Telephone Encounter (Signed)
I would have pt call their insurance and see if this is covered before we send a prescription and she is told that it is far too expensive

## 2021-07-05 NOTE — Telephone Encounter (Signed)
Unfortunately a B12 level was not drawn, but a daily OTC supplement is a great idea!  You can't take too much as this is one of the vitamins your body will pee out any extra

## 2021-07-05 NOTE — Telephone Encounter (Signed)
Patient aware of labs, she wanted to know if she needed to start the b-12 pill or the shot?

## 2021-07-05 NOTE — Telephone Encounter (Signed)
Ok to order Plush Catheter for patient

## 2021-07-05 NOTE — Telephone Encounter (Signed)
Form completed and placed in basket  

## 2021-07-05 NOTE — Telephone Encounter (Signed)
Caller name:[name]   On DPR? :Yes  Call back [place]  Provider they see: Birdie Riddle   Reason for Amery placed in [facility]

## 2021-07-05 NOTE — Telephone Encounter (Signed)
Called [name] with home health and she stated that she doesn't actually have access to the purewick's. They have to either be written as a rx or the patient can go on the website and order/pay for them out of pocket and they are quite pricey.

## 2021-07-05 NOTE — Telephone Encounter (Signed)
Returned phone,  please call back call [phone]

## 2021-07-05 NOTE — Telephone Encounter (Signed)
Labs all look good!  No changes at this time

## 2021-07-06 ENCOUNTER — Telehealth: Payer: Self-pay

## 2021-07-06 DIAGNOSIS — L989 Disorder of the skin and subcutaneous tissue, unspecified: Secondary | ICD-10-CM | POA: Diagnosis not present

## 2021-07-06 DIAGNOSIS — R350 Frequency of micturition: Secondary | ICD-10-CM | POA: Diagnosis not present

## 2021-07-06 DIAGNOSIS — E538 Deficiency of other specified B group vitamins: Secondary | ICD-10-CM | POA: Diagnosis not present

## 2021-07-06 DIAGNOSIS — G629 Polyneuropathy, unspecified: Secondary | ICD-10-CM | POA: Diagnosis not present

## 2021-07-06 DIAGNOSIS — E785 Hyperlipidemia, unspecified: Secondary | ICD-10-CM | POA: Diagnosis not present

## 2021-07-06 DIAGNOSIS — M5136 Other intervertebral disc degeneration, lumbar region: Secondary | ICD-10-CM | POA: Diagnosis not present

## 2021-07-06 DIAGNOSIS — K219 Gastro-esophageal reflux disease without esophagitis: Secondary | ICD-10-CM | POA: Diagnosis not present

## 2021-07-06 DIAGNOSIS — M797 Fibromyalgia: Secondary | ICD-10-CM | POA: Diagnosis not present

## 2021-07-06 DIAGNOSIS — I872 Venous insufficiency (chronic) (peripheral): Secondary | ICD-10-CM | POA: Diagnosis not present

## 2021-07-06 DIAGNOSIS — F32A Depression, unspecified: Secondary | ICD-10-CM | POA: Diagnosis not present

## 2021-07-06 DIAGNOSIS — E559 Vitamin D deficiency, unspecified: Secondary | ICD-10-CM | POA: Diagnosis not present

## 2021-07-06 DIAGNOSIS — L89159 Pressure ulcer of sacral region, unspecified stage: Secondary | ICD-10-CM | POA: Insufficient documentation

## 2021-07-06 DIAGNOSIS — M4802 Spinal stenosis, cervical region: Secondary | ICD-10-CM | POA: Diagnosis not present

## 2021-07-06 DIAGNOSIS — Z9181 History of falling: Secondary | ICD-10-CM | POA: Diagnosis not present

## 2021-07-06 DIAGNOSIS — G43909 Migraine, unspecified, not intractable, without status migrainosus: Secondary | ICD-10-CM | POA: Diagnosis not present

## 2021-07-06 DIAGNOSIS — G4733 Obstructive sleep apnea (adult) (pediatric): Secondary | ICD-10-CM | POA: Diagnosis not present

## 2021-07-06 DIAGNOSIS — G9529 Other cord compression: Secondary | ICD-10-CM | POA: Diagnosis not present

## 2021-07-06 DIAGNOSIS — K760 Fatty (change of) liver, not elsewhere classified: Secondary | ICD-10-CM | POA: Diagnosis not present

## 2021-07-06 DIAGNOSIS — W19XXXD Unspecified fall, subsequent encounter: Secondary | ICD-10-CM | POA: Diagnosis not present

## 2021-07-06 DIAGNOSIS — I10 Essential (primary) hypertension: Secondary | ICD-10-CM | POA: Diagnosis not present

## 2021-07-06 DIAGNOSIS — K259 Gastric ulcer, unspecified as acute or chronic, without hemorrhage or perforation: Secondary | ICD-10-CM | POA: Diagnosis not present

## 2021-07-06 DIAGNOSIS — G9589 Other specified diseases of spinal cord: Secondary | ICD-10-CM | POA: Diagnosis not present

## 2021-07-06 DIAGNOSIS — R238 Other skin changes: Secondary | ICD-10-CM | POA: Diagnosis not present

## 2021-07-06 DIAGNOSIS — N390 Urinary tract infection, site not specified: Secondary | ICD-10-CM | POA: Diagnosis not present

## 2021-07-06 DIAGNOSIS — M300 Polyarteritis nodosa: Secondary | ICD-10-CM | POA: Diagnosis not present

## 2021-07-06 NOTE — Telephone Encounter (Signed)
Caller name:Naisha Messman   On DPR? :Yes  Call back   Provider they see: [name]   Reason for call:[facility] form Client Coordination Note Report requesting signature placed in the bin

## 2021-07-06 NOTE — Telephone Encounter (Signed)
Caller name:Raegyn Ellerbrock   On DPR? :Yes  Call back [place]  Provider they see: Birdie Riddle   Reason for call:Orders placed in bin for [facility] order number [id number]

## 2021-07-06 NOTE — Telephone Encounter (Signed)
Orders placed in bin

## 2021-07-06 NOTE — Telephone Encounter (Signed)
Placed in your folder.

## 2021-07-06 NOTE — Telephone Encounter (Signed)
Placed in your sign folder.

## 2021-07-06 NOTE — Telephone Encounter (Signed)
Caller name:Merrillyn Drewry   On DPR? :Yes  Call back [place]  Provider they see: Birdie Riddle   Reason for call:[facility] Orders

## 2021-07-07 DIAGNOSIS — M5136 Other intervertebral disc degeneration, lumbar region: Secondary | ICD-10-CM | POA: Diagnosis not present

## 2021-07-07 DIAGNOSIS — G43909 Migraine, unspecified, not intractable, without status migrainosus: Secondary | ICD-10-CM | POA: Diagnosis not present

## 2021-07-07 DIAGNOSIS — G9529 Other cord compression: Secondary | ICD-10-CM | POA: Diagnosis not present

## 2021-07-07 DIAGNOSIS — M300 Polyarteritis nodosa: Secondary | ICD-10-CM | POA: Diagnosis not present

## 2021-07-07 DIAGNOSIS — W19XXXD Unspecified fall, subsequent encounter: Secondary | ICD-10-CM | POA: Diagnosis not present

## 2021-07-07 DIAGNOSIS — I10 Essential (primary) hypertension: Secondary | ICD-10-CM | POA: Diagnosis not present

## 2021-07-07 DIAGNOSIS — Z9181 History of falling: Secondary | ICD-10-CM | POA: Diagnosis not present

## 2021-07-07 DIAGNOSIS — N390 Urinary tract infection, site not specified: Secondary | ICD-10-CM | POA: Diagnosis not present

## 2021-07-07 DIAGNOSIS — L989 Disorder of the skin and subcutaneous tissue, unspecified: Secondary | ICD-10-CM | POA: Diagnosis not present

## 2021-07-07 DIAGNOSIS — E785 Hyperlipidemia, unspecified: Secondary | ICD-10-CM | POA: Diagnosis not present

## 2021-07-07 DIAGNOSIS — K219 Gastro-esophageal reflux disease without esophagitis: Secondary | ICD-10-CM | POA: Diagnosis not present

## 2021-07-07 DIAGNOSIS — G629 Polyneuropathy, unspecified: Secondary | ICD-10-CM | POA: Diagnosis not present

## 2021-07-07 DIAGNOSIS — K760 Fatty (change of) liver, not elsewhere classified: Secondary | ICD-10-CM | POA: Diagnosis not present

## 2021-07-07 DIAGNOSIS — I872 Venous insufficiency (chronic) (peripheral): Secondary | ICD-10-CM | POA: Diagnosis not present

## 2021-07-07 DIAGNOSIS — E538 Deficiency of other specified B group vitamins: Secondary | ICD-10-CM | POA: Diagnosis not present

## 2021-07-07 DIAGNOSIS — F32A Depression, unspecified: Secondary | ICD-10-CM | POA: Diagnosis not present

## 2021-07-07 DIAGNOSIS — K259 Gastric ulcer, unspecified as acute or chronic, without hemorrhage or perforation: Secondary | ICD-10-CM | POA: Diagnosis not present

## 2021-07-07 DIAGNOSIS — M4802 Spinal stenosis, cervical region: Secondary | ICD-10-CM | POA: Diagnosis not present

## 2021-07-07 DIAGNOSIS — R238 Other skin changes: Secondary | ICD-10-CM | POA: Diagnosis not present

## 2021-07-07 DIAGNOSIS — M797 Fibromyalgia: Secondary | ICD-10-CM | POA: Diagnosis not present

## 2021-07-07 DIAGNOSIS — E559 Vitamin D deficiency, unspecified: Secondary | ICD-10-CM | POA: Diagnosis not present

## 2021-07-07 DIAGNOSIS — G4733 Obstructive sleep apnea (adult) (pediatric): Secondary | ICD-10-CM | POA: Diagnosis not present

## 2021-07-07 DIAGNOSIS — R350 Frequency of micturition: Secondary | ICD-10-CM | POA: Diagnosis not present

## 2021-07-07 DIAGNOSIS — G9589 Other specified diseases of spinal cord: Secondary | ICD-10-CM | POA: Diagnosis not present

## 2021-07-07 NOTE — Telephone Encounter (Signed)
Spoke to patient and let her know that she needed to call her insurance company to inquire about coverage and price for the [place]. She states that she will call them and give me a call back to let me know how to proceed

## 2021-07-07 NOTE — Telephone Encounter (Signed)
I have faxed the [place] form to the company. Per the website, they will call patient to help them complete the order. Will call patient and inform her

## 2021-07-07 NOTE — Telephone Encounter (Signed)
Spoke to patient and she stated that the [employer] did call her and they informed her that she would have to go through a third party biller in order for insurance to cover it. I will need to do more research on this

## 2021-07-07 NOTE — Telephone Encounter (Signed)
[place] called in stating that she spoke with her insurance company they told her that if a PA is submitted that they will cover the [place] will be covered at 100%

## 2021-07-08 ENCOUNTER — Ambulatory Visit: Admit: 2021-07-08 | Payer: MEDICARE

## 2021-07-08 DIAGNOSIS — G9589 Other specified diseases of spinal cord: Secondary | ICD-10-CM | POA: Diagnosis not present

## 2021-07-08 DIAGNOSIS — N390 Urinary tract infection, site not specified: Secondary | ICD-10-CM | POA: Diagnosis not present

## 2021-07-08 DIAGNOSIS — Z9181 History of falling: Secondary | ICD-10-CM | POA: Diagnosis not present

## 2021-07-08 DIAGNOSIS — G4733 Obstructive sleep apnea (adult) (pediatric): Secondary | ICD-10-CM | POA: Diagnosis not present

## 2021-07-08 DIAGNOSIS — M300 Polyarteritis nodosa: Secondary | ICD-10-CM | POA: Diagnosis not present

## 2021-07-08 DIAGNOSIS — M797 Fibromyalgia: Secondary | ICD-10-CM | POA: Diagnosis not present

## 2021-07-08 DIAGNOSIS — E785 Hyperlipidemia, unspecified: Secondary | ICD-10-CM | POA: Diagnosis not present

## 2021-07-08 DIAGNOSIS — I872 Venous insufficiency (chronic) (peripheral): Secondary | ICD-10-CM | POA: Diagnosis not present

## 2021-07-08 DIAGNOSIS — G9529 Other cord compression: Secondary | ICD-10-CM | POA: Diagnosis not present

## 2021-07-08 DIAGNOSIS — E538 Deficiency of other specified B group vitamins: Secondary | ICD-10-CM | POA: Diagnosis not present

## 2021-07-08 DIAGNOSIS — R350 Frequency of micturition: Secondary | ICD-10-CM | POA: Diagnosis not present

## 2021-07-08 DIAGNOSIS — K259 Gastric ulcer, unspecified as acute or chronic, without hemorrhage or perforation: Secondary | ICD-10-CM | POA: Diagnosis not present

## 2021-07-08 DIAGNOSIS — G629 Polyneuropathy, unspecified: Secondary | ICD-10-CM | POA: Diagnosis not present

## 2021-07-08 DIAGNOSIS — F32A Depression, unspecified: Secondary | ICD-10-CM | POA: Diagnosis not present

## 2021-07-08 DIAGNOSIS — E559 Vitamin D deficiency, unspecified: Secondary | ICD-10-CM | POA: Diagnosis not present

## 2021-07-08 DIAGNOSIS — I10 Essential (primary) hypertension: Secondary | ICD-10-CM | POA: Diagnosis not present

## 2021-07-08 DIAGNOSIS — M4802 Spinal stenosis, cervical region: Secondary | ICD-10-CM | POA: Diagnosis not present

## 2021-07-08 DIAGNOSIS — L989 Disorder of the skin and subcutaneous tissue, unspecified: Secondary | ICD-10-CM | POA: Diagnosis not present

## 2021-07-08 DIAGNOSIS — K219 Gastro-esophageal reflux disease without esophagitis: Secondary | ICD-10-CM | POA: Diagnosis not present

## 2021-07-08 DIAGNOSIS — K760 Fatty (change of) liver, not elsewhere classified: Secondary | ICD-10-CM | POA: Diagnosis not present

## 2021-07-08 DIAGNOSIS — R238 Other skin changes: Secondary | ICD-10-CM | POA: Diagnosis not present

## 2021-07-08 DIAGNOSIS — W19XXXD Unspecified fall, subsequent encounter: Secondary | ICD-10-CM | POA: Diagnosis not present

## 2021-07-08 DIAGNOSIS — M5136 Other intervertebral disc degeneration, lumbar region: Secondary | ICD-10-CM | POA: Diagnosis not present

## 2021-07-08 DIAGNOSIS — G43909 Migraine, unspecified, not intractable, without status migrainosus: Secondary | ICD-10-CM | POA: Diagnosis not present

## 2021-07-08 NOTE — Telephone Encounter (Signed)
Form completed and placed in basket  

## 2021-07-08 NOTE — Telephone Encounter (Signed)
faxed

## 2021-07-08 NOTE — Telephone Encounter (Signed)
Spoke to [employer], they stated that at this time they are not filing anything though any insurances and the price will have to be paid out of pocket from the patient. I called patient to inform her of this, she understood and will call back later with an update of how she would like to proceed

## 2021-07-10 DIAGNOSIS — G4733 Obstructive sleep apnea (adult) (pediatric): Secondary | ICD-10-CM | POA: Diagnosis not present

## 2021-07-10 DIAGNOSIS — E871 Hypo-osmolality and hyponatremia: Secondary | ICD-10-CM | POA: Diagnosis not present

## 2021-07-10 DIAGNOSIS — A419 Sepsis, unspecified organism: Secondary | ICD-10-CM | POA: Diagnosis not present

## 2021-07-12 ENCOUNTER — Other Ambulatory Visit: Payer: Self-pay | Admitting: Family Medicine

## 2021-07-12 DIAGNOSIS — N390 Urinary tract infection, site not specified: Secondary | ICD-10-CM | POA: Diagnosis not present

## 2021-07-12 DIAGNOSIS — G43909 Migraine, unspecified, not intractable, without status migrainosus: Secondary | ICD-10-CM | POA: Diagnosis not present

## 2021-07-12 DIAGNOSIS — Z9181 History of falling: Secondary | ICD-10-CM | POA: Diagnosis not present

## 2021-07-12 DIAGNOSIS — I10 Essential (primary) hypertension: Secondary | ICD-10-CM | POA: Diagnosis not present

## 2021-07-12 DIAGNOSIS — R350 Frequency of micturition: Secondary | ICD-10-CM | POA: Diagnosis not present

## 2021-07-12 DIAGNOSIS — K219 Gastro-esophageal reflux disease without esophagitis: Secondary | ICD-10-CM | POA: Diagnosis not present

## 2021-07-12 DIAGNOSIS — G9529 Other cord compression: Secondary | ICD-10-CM | POA: Diagnosis not present

## 2021-07-12 DIAGNOSIS — E538 Deficiency of other specified B group vitamins: Secondary | ICD-10-CM | POA: Diagnosis not present

## 2021-07-12 DIAGNOSIS — G4733 Obstructive sleep apnea (adult) (pediatric): Secondary | ICD-10-CM | POA: Diagnosis not present

## 2021-07-12 DIAGNOSIS — F32A Depression, unspecified: Secondary | ICD-10-CM | POA: Diagnosis not present

## 2021-07-12 DIAGNOSIS — E785 Hyperlipidemia, unspecified: Secondary | ICD-10-CM | POA: Diagnosis not present

## 2021-07-12 DIAGNOSIS — K760 Fatty (change of) liver, not elsewhere classified: Secondary | ICD-10-CM | POA: Diagnosis not present

## 2021-07-12 DIAGNOSIS — M300 Polyarteritis nodosa: Secondary | ICD-10-CM | POA: Diagnosis not present

## 2021-07-12 DIAGNOSIS — E559 Vitamin D deficiency, unspecified: Secondary | ICD-10-CM | POA: Diagnosis not present

## 2021-07-12 DIAGNOSIS — W19XXXD Unspecified fall, subsequent encounter: Secondary | ICD-10-CM | POA: Diagnosis not present

## 2021-07-12 DIAGNOSIS — R238 Other skin changes: Secondary | ICD-10-CM | POA: Diagnosis not present

## 2021-07-12 DIAGNOSIS — M4802 Spinal stenosis, cervical region: Secondary | ICD-10-CM | POA: Diagnosis not present

## 2021-07-12 DIAGNOSIS — I872 Venous insufficiency (chronic) (peripheral): Secondary | ICD-10-CM | POA: Diagnosis not present

## 2021-07-12 DIAGNOSIS — M5136 Other intervertebral disc degeneration, lumbar region: Secondary | ICD-10-CM | POA: Diagnosis not present

## 2021-07-12 DIAGNOSIS — M797 Fibromyalgia: Secondary | ICD-10-CM | POA: Diagnosis not present

## 2021-07-12 DIAGNOSIS — G629 Polyneuropathy, unspecified: Secondary | ICD-10-CM | POA: Diagnosis not present

## 2021-07-12 DIAGNOSIS — G9589 Other specified diseases of spinal cord: Secondary | ICD-10-CM | POA: Diagnosis not present

## 2021-07-12 DIAGNOSIS — L989 Disorder of the skin and subcutaneous tissue, unspecified: Secondary | ICD-10-CM | POA: Diagnosis not present

## 2021-07-12 DIAGNOSIS — K259 Gastric ulcer, unspecified as acute or chronic, without hemorrhage or perforation: Secondary | ICD-10-CM | POA: Diagnosis not present

## 2021-07-13 DIAGNOSIS — K760 Fatty (change of) liver, not elsewhere classified: Secondary | ICD-10-CM | POA: Diagnosis not present

## 2021-07-13 DIAGNOSIS — I10 Essential (primary) hypertension: Secondary | ICD-10-CM | POA: Diagnosis not present

## 2021-07-13 DIAGNOSIS — K219 Gastro-esophageal reflux disease without esophagitis: Secondary | ICD-10-CM | POA: Diagnosis not present

## 2021-07-13 DIAGNOSIS — M797 Fibromyalgia: Secondary | ICD-10-CM | POA: Diagnosis not present

## 2021-07-13 DIAGNOSIS — N390 Urinary tract infection, site not specified: Secondary | ICD-10-CM | POA: Diagnosis not present

## 2021-07-13 DIAGNOSIS — R238 Other skin changes: Secondary | ICD-10-CM | POA: Diagnosis not present

## 2021-07-13 DIAGNOSIS — L989 Disorder of the skin and subcutaneous tissue, unspecified: Secondary | ICD-10-CM | POA: Diagnosis not present

## 2021-07-13 DIAGNOSIS — E538 Deficiency of other specified B group vitamins: Secondary | ICD-10-CM | POA: Diagnosis not present

## 2021-07-13 DIAGNOSIS — M5136 Other intervertebral disc degeneration, lumbar region: Secondary | ICD-10-CM | POA: Diagnosis not present

## 2021-07-13 DIAGNOSIS — M300 Polyarteritis nodosa: Secondary | ICD-10-CM | POA: Diagnosis not present

## 2021-07-13 DIAGNOSIS — R350 Frequency of micturition: Secondary | ICD-10-CM | POA: Diagnosis not present

## 2021-07-13 DIAGNOSIS — G9529 Other cord compression: Secondary | ICD-10-CM | POA: Diagnosis not present

## 2021-07-13 DIAGNOSIS — M4802 Spinal stenosis, cervical region: Secondary | ICD-10-CM | POA: Diagnosis not present

## 2021-07-13 DIAGNOSIS — W19XXXD Unspecified fall, subsequent encounter: Secondary | ICD-10-CM | POA: Diagnosis not present

## 2021-07-13 DIAGNOSIS — G43909 Migraine, unspecified, not intractable, without status migrainosus: Secondary | ICD-10-CM | POA: Diagnosis not present

## 2021-07-13 DIAGNOSIS — Z9181 History of falling: Secondary | ICD-10-CM | POA: Diagnosis not present

## 2021-07-13 DIAGNOSIS — G629 Polyneuropathy, unspecified: Secondary | ICD-10-CM | POA: Diagnosis not present

## 2021-07-13 DIAGNOSIS — E785 Hyperlipidemia, unspecified: Secondary | ICD-10-CM | POA: Diagnosis not present

## 2021-07-13 DIAGNOSIS — I872 Venous insufficiency (chronic) (peripheral): Secondary | ICD-10-CM | POA: Diagnosis not present

## 2021-07-13 DIAGNOSIS — E559 Vitamin D deficiency, unspecified: Secondary | ICD-10-CM | POA: Diagnosis not present

## 2021-07-13 DIAGNOSIS — F32A Depression, unspecified: Secondary | ICD-10-CM | POA: Diagnosis not present

## 2021-07-13 DIAGNOSIS — G9589 Other specified diseases of spinal cord: Secondary | ICD-10-CM | POA: Diagnosis not present

## 2021-07-13 DIAGNOSIS — G4733 Obstructive sleep apnea (adult) (pediatric): Secondary | ICD-10-CM | POA: Diagnosis not present

## 2021-07-13 DIAGNOSIS — K259 Gastric ulcer, unspecified as acute or chronic, without hemorrhage or perforation: Secondary | ICD-10-CM | POA: Diagnosis not present

## 2021-07-13 NOTE — Telephone Encounter (Signed)
Home nurse for [facility] called requesting to have magnesium level drawn, and would like to know if it is ok for her to draw it next week. She also states that pt urine smells terrible and wondered why. She could be reach at [phone].  Please advice  Thank You

## 2021-07-14 ENCOUNTER — Other Ambulatory Visit: Payer: Self-pay | Admitting: Family Medicine

## 2021-07-14 NOTE — Telephone Encounter (Signed)
Ok to give verbal order for Magnesium level.  Also, please ask for UA and culture given urine odor ?

## 2021-07-14 NOTE — Telephone Encounter (Signed)
Spoke to [place] and she stated that she will do it next week at her visit ?

## 2021-07-16 ENCOUNTER — Ambulatory Visit: Payer: Self-pay | Admitting: Diagnostic Neuroimaging

## 2021-07-17 DIAGNOSIS — G4733 Obstructive sleep apnea (adult) (pediatric): Secondary | ICD-10-CM | POA: Diagnosis not present

## 2021-07-17 DIAGNOSIS — K259 Gastric ulcer, unspecified as acute or chronic, without hemorrhage or perforation: Secondary | ICD-10-CM | POA: Diagnosis not present

## 2021-07-17 DIAGNOSIS — M4802 Spinal stenosis, cervical region: Secondary | ICD-10-CM | POA: Diagnosis not present

## 2021-07-17 DIAGNOSIS — M797 Fibromyalgia: Secondary | ICD-10-CM | POA: Diagnosis not present

## 2021-07-17 DIAGNOSIS — W19XXXD Unspecified fall, subsequent encounter: Secondary | ICD-10-CM | POA: Diagnosis not present

## 2021-07-17 DIAGNOSIS — F32A Depression, unspecified: Secondary | ICD-10-CM | POA: Diagnosis not present

## 2021-07-17 DIAGNOSIS — I10 Essential (primary) hypertension: Secondary | ICD-10-CM | POA: Diagnosis not present

## 2021-07-17 DIAGNOSIS — R238 Other skin changes: Secondary | ICD-10-CM | POA: Diagnosis not present

## 2021-07-17 DIAGNOSIS — E559 Vitamin D deficiency, unspecified: Secondary | ICD-10-CM | POA: Diagnosis not present

## 2021-07-17 DIAGNOSIS — N390 Urinary tract infection, site not specified: Secondary | ICD-10-CM | POA: Diagnosis not present

## 2021-07-17 DIAGNOSIS — K760 Fatty (change of) liver, not elsewhere classified: Secondary | ICD-10-CM | POA: Diagnosis not present

## 2021-07-17 DIAGNOSIS — G43909 Migraine, unspecified, not intractable, without status migrainosus: Secondary | ICD-10-CM | POA: Diagnosis not present

## 2021-07-17 DIAGNOSIS — R350 Frequency of micturition: Secondary | ICD-10-CM | POA: Diagnosis not present

## 2021-07-17 DIAGNOSIS — G9529 Other cord compression: Secondary | ICD-10-CM | POA: Diagnosis not present

## 2021-07-17 DIAGNOSIS — M5136 Other intervertebral disc degeneration, lumbar region: Secondary | ICD-10-CM | POA: Diagnosis not present

## 2021-07-17 DIAGNOSIS — E785 Hyperlipidemia, unspecified: Secondary | ICD-10-CM | POA: Diagnosis not present

## 2021-07-17 DIAGNOSIS — E538 Deficiency of other specified B group vitamins: Secondary | ICD-10-CM | POA: Diagnosis not present

## 2021-07-17 DIAGNOSIS — G629 Polyneuropathy, unspecified: Secondary | ICD-10-CM | POA: Diagnosis not present

## 2021-07-17 DIAGNOSIS — M300 Polyarteritis nodosa: Secondary | ICD-10-CM | POA: Diagnosis not present

## 2021-07-17 DIAGNOSIS — Z9181 History of falling: Secondary | ICD-10-CM | POA: Diagnosis not present

## 2021-07-17 DIAGNOSIS — L989 Disorder of the skin and subcutaneous tissue, unspecified: Secondary | ICD-10-CM | POA: Diagnosis not present

## 2021-07-17 DIAGNOSIS — K219 Gastro-esophageal reflux disease without esophagitis: Secondary | ICD-10-CM | POA: Diagnosis not present

## 2021-07-17 DIAGNOSIS — G9589 Other specified diseases of spinal cord: Secondary | ICD-10-CM | POA: Diagnosis not present

## 2021-07-17 DIAGNOSIS — I872 Venous insufficiency (chronic) (peripheral): Secondary | ICD-10-CM | POA: Diagnosis not present

## 2021-07-20 ENCOUNTER — Telehealth: Payer: Self-pay | Admitting: Pharmacist

## 2021-07-20 DIAGNOSIS — F32A Depression, unspecified: Secondary | ICD-10-CM | POA: Diagnosis not present

## 2021-07-20 DIAGNOSIS — R238 Other skin changes: Secondary | ICD-10-CM | POA: Diagnosis not present

## 2021-07-20 DIAGNOSIS — W19XXXD Unspecified fall, subsequent encounter: Secondary | ICD-10-CM | POA: Diagnosis not present

## 2021-07-20 DIAGNOSIS — G43909 Migraine, unspecified, not intractable, without status migrainosus: Secondary | ICD-10-CM | POA: Diagnosis not present

## 2021-07-20 DIAGNOSIS — G9589 Other specified diseases of spinal cord: Secondary | ICD-10-CM | POA: Diagnosis not present

## 2021-07-20 DIAGNOSIS — N390 Urinary tract infection, site not specified: Secondary | ICD-10-CM | POA: Diagnosis not present

## 2021-07-20 DIAGNOSIS — E538 Deficiency of other specified B group vitamins: Secondary | ICD-10-CM | POA: Diagnosis not present

## 2021-07-20 DIAGNOSIS — G629 Polyneuropathy, unspecified: Secondary | ICD-10-CM | POA: Diagnosis not present

## 2021-07-20 DIAGNOSIS — K760 Fatty (change of) liver, not elsewhere classified: Secondary | ICD-10-CM | POA: Diagnosis not present

## 2021-07-20 DIAGNOSIS — L989 Disorder of the skin and subcutaneous tissue, unspecified: Secondary | ICD-10-CM | POA: Diagnosis not present

## 2021-07-20 DIAGNOSIS — M5136 Other intervertebral disc degeneration, lumbar region: Secondary | ICD-10-CM | POA: Diagnosis not present

## 2021-07-20 DIAGNOSIS — E785 Hyperlipidemia, unspecified: Secondary | ICD-10-CM | POA: Diagnosis not present

## 2021-07-20 DIAGNOSIS — M4802 Spinal stenosis, cervical region: Secondary | ICD-10-CM | POA: Diagnosis not present

## 2021-07-20 DIAGNOSIS — G9529 Other cord compression: Secondary | ICD-10-CM | POA: Diagnosis not present

## 2021-07-20 DIAGNOSIS — K259 Gastric ulcer, unspecified as acute or chronic, without hemorrhage or perforation: Secondary | ICD-10-CM | POA: Diagnosis not present

## 2021-07-20 DIAGNOSIS — I10 Essential (primary) hypertension: Secondary | ICD-10-CM | POA: Diagnosis not present

## 2021-07-20 DIAGNOSIS — I872 Venous insufficiency (chronic) (peripheral): Secondary | ICD-10-CM | POA: Diagnosis not present

## 2021-07-20 DIAGNOSIS — E559 Vitamin D deficiency, unspecified: Secondary | ICD-10-CM | POA: Diagnosis not present

## 2021-07-20 DIAGNOSIS — G4733 Obstructive sleep apnea (adult) (pediatric): Secondary | ICD-10-CM | POA: Diagnosis not present

## 2021-07-20 DIAGNOSIS — K219 Gastro-esophageal reflux disease without esophagitis: Secondary | ICD-10-CM | POA: Diagnosis not present

## 2021-07-20 DIAGNOSIS — M300 Polyarteritis nodosa: Secondary | ICD-10-CM | POA: Diagnosis not present

## 2021-07-20 DIAGNOSIS — M797 Fibromyalgia: Secondary | ICD-10-CM | POA: Diagnosis not present

## 2021-07-20 DIAGNOSIS — R350 Frequency of micturition: Secondary | ICD-10-CM | POA: Diagnosis not present

## 2021-07-20 DIAGNOSIS — Z9181 History of falling: Secondary | ICD-10-CM | POA: Diagnosis not present

## 2021-07-20 NOTE — Progress Notes (Cosign Needed)
/   Chronic Care Management Pharmacy Assistant   Name: [name]  MRN: [id number] DOB: Oct 02, 1970   Reason for Encounter: Disease State - General Adherence Call     Recent office visits:  06/28/21 [name], MD - Family Medicine - Vitamin D Def - Continue Vitamin D Supplements. Follow up as scheduled.   05/31/21 [name], MD - Family Medicine- Fall - [facility] PT, nursing, SW were ordered. Follow up as needed.   05/07/21 [name], MD - Family Medicine- Cellulitis - Bone density ordered. Referral to Neurology placed. Follow up as scheduled.   04/27/21 [name], MD - Arm injury - XR of right hand, right shoulder and right wrist were ordered. Follow up as needed.   02/10/21 [name], MD - Family Medicine - Cellulitis - Flu shot administered. Continue Doxycyline. Follow up as scheduled.   02/01/21 [name], MD - Family Medicine- Cellulitis -  Currently infection is improved but this is an ongoing/recurring battle for her.  Will provide 7 day course of Doxy to have on hand should this recur. Follow up as needed.   01/21/21 [name], DO - Family Medicine- Burning sensation of skin - doxycycline (VIBRA-TABS) 100 MG tablet prescribed. Follow up with PCP in 1 week.    Recent consult visits:  04/19/21 [name] - Routine wound care performed. Follow up as scheduled.   04/12/21 [place] - Routine wound care performed. Follow up as scheduled  04/05/21 [place] - Routine wound care performed. Follow up as scheduled  03/22/21 [name] - Rheumatology - Thrombocytosis - No notes available.   03/11/21 [name] - Rheumatology - PAN - No notes available.  02/18/21 [name] - Physical Therapy - No notes available  02/18/21 [name] - Occupational Therapy - No notes available.  02/11/21 [name] - Occupational Therapy - No notes available.  02/09/21 [name] - Occupational  Therapy - No notes available.  02/03/21  [name] - Physical Therapy - No notes available  02/03/21 [name] - Occupational Therapy - No notes available.  01/28/21 [name] - Occupational Therapy - No notes available.  01/25/21 [name] - Occupational Therapy - No notes available.  01/20/21 [name] - Occupational Therapy - No notes available.  01/14/21 [name] - Occupational Therapy - No notes available.  Hospital visits: 06/04/21 - 06/10/21 Medication Reconciliation was completed by comparing discharge summary, patients EMR and Pharmacy list, and upon discussion with patient.  Admitted to the hospital on 06/04/21 due to Weakness. Discharge date was 06/10/21. Discharged from [facility]?Medications Started at [facility] Discharge:?? ciprofloxacin (CIPRO) folic acid (FOLVITE) pyridoxine (B-6) thiamine vitamin B-12  Medication Changes at Hospital Discharge: None noted.   Medications Discontinued at Hospital Discharge: clobetasol cream 0.05 % (TEMOVATE) doxycycline 100 MG tablet (VIBRA-TABS) fluconazole 100 MG tablet (Diflucan) HYDROcodone-acetaminophen 10-325 MG tablet (NORCO) irbesartan 75 MG tablet (AVAPRO) meclizine 25 MG tablet (ANTIVERT) mupirocin ointment 2 % (BACTROBAN) nystatin powder  Medications that remain the same after Hospital Discharge:??  All other medications will remain the same.    Medications: Outpatient Encounter Medications as of 07/20/2021  Medication Sig Note   ALPRAZolam (XANAX) 0.5 MG tablet Take 1 tablet (0.5 mg total) by mouth 2 (two) times daily.    ARIPiprazole (ABILIFY) 10 MG tablet Take 10 mg by mouth in the morning.     Armodafinil 250 MG tablet Take 250 mg by  mouth 3 (three) times daily. 8am, 2pm, 6pm    Biotin 10 MG TABS Take 10 mg by mouth in the morning.     buPROPion (WELLBUTRIN XL) 300 MG 24 hr tablet TAKE 1 TABLET BY MOUTH EVERY DAY (Patient taking differently: 300 mg daily.)    busPIRone (BUSPAR) 30 MG  tablet TAKE 1 TABLET BY MOUTH 2 TIMES DAILY (Patient taking differently: Take 30 mg by mouth 2 (two) times daily.) 06/04/2021: Stopped due to nausea   calcium citrate (CALCITRATE - DOSED IN MG ELEMENTAL CALCIUM) 950 MG tablet Take 1 tablet by mouth daily.    clonazePAM (KLONOPIN) 1 MG tablet TAKE 1 TABLET BY MOUTH AT BEDTIME (Patient taking differently: Take 1 mg by mouth daily.)    collagenase (SANTYL) ointment Apply topically daily. apply Santyl to scabs on legs (Patient taking differently: Apply 1 application topically daily. apply Santyl to scabs on legs)    cyclobenzaprine (FLEXERIL) 10 MG tablet TAKE 1 TABLET BY MOUTH 3 TIMES DAILY AS NEEDED (Patient taking differently: Take 10 mg by mouth 2 (two) times daily.)    diclofenac Sodium (VOLTAREN) 1 % GEL Apply 4 g topically 4 (four) times daily.    diphenhydrAMINE (BENADRYL) 25 MG tablet Take 25 mg by mouth every 6 (six) hours as needed for allergies.    DULoxetine (CYMBALTA) 60 MG capsule Take 2 capsules (120 mg total) by mouth daily. (Patient taking differently: Take 60 mg by mouth 2 (two) times daily.)    FEROSUL 325 (65 Fe) MG tablet TAKE 1 TABLET BY MOUTH EVERY DAY WITH BREAKFAST (Patient taking differently: Take 325 mg by mouth daily with breakfast.)    folic acid (FOLVITE) 1 MG tablet Take 1 tablet (1 mg total) by mouth daily.    furosemide (LASIX) 40 MG tablet TAKE 1 TABLET BY MOUTH EVERY DAY MAY take additional TABLET IF weight INCREASE by 5lb AND call MD FOR further instructions (Patient taking differently: Take 40 mg by mouth daily. 1t qd-may take an additional tablet if weight increases by 5lbs.)    gabapentin (NEURONTIN) 600 MG tablet Take 1 tablet (600 mg total) by mouth 3 (three) times daily. TAKE 2 TABLETS BY MOUTH 3 TIMES DAILY (Patient taking differently: Take 600-1,200 mg by mouth 3 (three) times daily. Takes 1 tablet in the morning and afternoon and 2 tablets at night.)    HYDROcodone-acetaminophen (NORCO) 10-325 MG tablet TAKE 1  TABLET BY MOUTH EVERY 6 HOURS AS NEEDED    nebivolol (BYSTOLIC) 10 MG tablet TAKE 1 TABLET BY MOUTH EVERY DAY (Patient taking differently: Take 10 mg by mouth daily.)    nystatin (MYCOSTATIN/NYSTOP) powder APPLY 1 APPLICATION TOPICALLY 3 TIMES DAILY    Oxcarbazepine (TRILEPTAL) 300 MG tablet Take 300 mg by mouth 2 (two) times daily.    pantoprazole (PROTONIX) 40 MG tablet TAKE 1 TABLET BY MOUTH 2 TIMES DAILY (Patient taking differently: 40 mg 2 (two) times daily before a meal.)    phentermine 37.5 MG capsule TAKE 1 CAPSULE BY MOUTH EVERY MORNING (Patient taking differently: Take 37.5 mg by mouth daily.)    promethazine (PHENERGAN) 25 MG tablet TAKE 1 TABLET BY MOUTH EVERY 6 HOURS AS NEEDED FOR NAUSEA AND vomiting    pyridOXINE (B-6) 100 MG tablet Take 1 tablet (100 mg total) by mouth daily.    rosuvastatin (CRESTOR) 20 MG tablet TAKE 1 TABLET BY MOUTH EVERY DAY (Patient taking differently: Take 20 mg by mouth daily.)    senna-docusate (SENOKOT-S) 8.6-50 MG tablet Take 1 tablet  by mouth at bedtime. (Patient taking differently: Take 1 tablet by mouth daily as needed for mild constipation.)    SKYRIZI 150 MG/ML SOSY Inject 150 mg into the skin every 3 (three) months.    SUMAtriptan (IMITREX) 100 MG tablet TAKE 1 TABLET BY MOUTH AT migraine ONSET, MAY REPEAT in 2 hours IF HEADACHE persists OR recurs (Patient taking differently: Take 100 mg by mouth every 2 (two) hours as needed for migraine.)    thiamine 100 MG tablet Take 1 tablet (100 mg total) by mouth daily.    vitamin B-12 (CYANOCOBALAMIN) 1000 MCG tablet Take 1 tablet (1,000 mcg total) by mouth daily.    No facility-administered encounter medications on file as of 07/20/2021.    Have you had any problems recently with your health?   Have you had any problems with your pharmacy?   What issues or side effects are you having with your medications?   What would you like me to pass along to Leata Mouse, CPP for them to help you with?    What  can we do to take care of you better?   Care Gaps  AWV: done 09/14/20 Colonoscopy: never DM Eye Exam: N/A DM Foot Exam: N/A Microalbumin: N/A HbgAIC: N/A DEXA: done 06/05/17 (has been ordered) Mammogram: done 01/09/20  Star Rating Drugs: No Star Rating Drugs Noted.   Future Appointments  Date Time Provider [place]  08/09/2021  8:30 AM Marcial Pacas, MD [facility] None   Multiple attempts were made to contact patient. Attempts were unsuccessful. / ls,CMA   Jobe Gibbon, [place] Pharmacist Assistant  [phone]

## 2021-07-22 DIAGNOSIS — E559 Vitamin D deficiency, unspecified: Secondary | ICD-10-CM | POA: Diagnosis not present

## 2021-07-22 DIAGNOSIS — N390 Urinary tract infection, site not specified: Secondary | ICD-10-CM | POA: Diagnosis not present

## 2021-07-22 DIAGNOSIS — K259 Gastric ulcer, unspecified as acute or chronic, without hemorrhage or perforation: Secondary | ICD-10-CM | POA: Diagnosis not present

## 2021-07-22 DIAGNOSIS — E785 Hyperlipidemia, unspecified: Secondary | ICD-10-CM | POA: Diagnosis not present

## 2021-07-22 DIAGNOSIS — F32A Depression, unspecified: Secondary | ICD-10-CM | POA: Diagnosis not present

## 2021-07-22 DIAGNOSIS — K760 Fatty (change of) liver, not elsewhere classified: Secondary | ICD-10-CM | POA: Diagnosis not present

## 2021-07-22 DIAGNOSIS — W19XXXD Unspecified fall, subsequent encounter: Secondary | ICD-10-CM | POA: Diagnosis not present

## 2021-07-22 DIAGNOSIS — G629 Polyneuropathy, unspecified: Secondary | ICD-10-CM | POA: Diagnosis not present

## 2021-07-22 DIAGNOSIS — R238 Other skin changes: Secondary | ICD-10-CM | POA: Diagnosis not present

## 2021-07-22 DIAGNOSIS — M797 Fibromyalgia: Secondary | ICD-10-CM | POA: Diagnosis not present

## 2021-07-22 DIAGNOSIS — G9589 Other specified diseases of spinal cord: Secondary | ICD-10-CM | POA: Diagnosis not present

## 2021-07-22 DIAGNOSIS — G9529 Other cord compression: Secondary | ICD-10-CM | POA: Diagnosis not present

## 2021-07-22 DIAGNOSIS — L989 Disorder of the skin and subcutaneous tissue, unspecified: Secondary | ICD-10-CM | POA: Diagnosis not present

## 2021-07-22 DIAGNOSIS — Z9181 History of falling: Secondary | ICD-10-CM | POA: Diagnosis not present

## 2021-07-22 DIAGNOSIS — I872 Venous insufficiency (chronic) (peripheral): Secondary | ICD-10-CM | POA: Diagnosis not present

## 2021-07-22 DIAGNOSIS — G4733 Obstructive sleep apnea (adult) (pediatric): Secondary | ICD-10-CM | POA: Diagnosis not present

## 2021-07-22 DIAGNOSIS — R350 Frequency of micturition: Secondary | ICD-10-CM | POA: Diagnosis not present

## 2021-07-22 DIAGNOSIS — M5136 Other intervertebral disc degeneration, lumbar region: Secondary | ICD-10-CM | POA: Diagnosis not present

## 2021-07-22 DIAGNOSIS — M300 Polyarteritis nodosa: Secondary | ICD-10-CM | POA: Diagnosis not present

## 2021-07-22 DIAGNOSIS — I10 Essential (primary) hypertension: Secondary | ICD-10-CM | POA: Diagnosis not present

## 2021-07-22 DIAGNOSIS — E538 Deficiency of other specified B group vitamins: Secondary | ICD-10-CM | POA: Diagnosis not present

## 2021-07-22 DIAGNOSIS — K219 Gastro-esophageal reflux disease without esophagitis: Secondary | ICD-10-CM | POA: Diagnosis not present

## 2021-07-22 DIAGNOSIS — G43909 Migraine, unspecified, not intractable, without status migrainosus: Secondary | ICD-10-CM | POA: Diagnosis not present

## 2021-07-22 DIAGNOSIS — M4802 Spinal stenosis, cervical region: Secondary | ICD-10-CM | POA: Diagnosis not present

## 2021-07-23 DIAGNOSIS — L989 Disorder of the skin and subcutaneous tissue, unspecified: Secondary | ICD-10-CM | POA: Diagnosis not present

## 2021-07-23 DIAGNOSIS — K760 Fatty (change of) liver, not elsewhere classified: Secondary | ICD-10-CM | POA: Diagnosis not present

## 2021-07-23 DIAGNOSIS — G9529 Other cord compression: Secondary | ICD-10-CM | POA: Diagnosis not present

## 2021-07-23 DIAGNOSIS — M797 Fibromyalgia: Secondary | ICD-10-CM | POA: Diagnosis not present

## 2021-07-23 DIAGNOSIS — R238 Other skin changes: Secondary | ICD-10-CM | POA: Diagnosis not present

## 2021-07-23 DIAGNOSIS — M5136 Other intervertebral disc degeneration, lumbar region: Secondary | ICD-10-CM | POA: Diagnosis not present

## 2021-07-23 DIAGNOSIS — N390 Urinary tract infection, site not specified: Secondary | ICD-10-CM | POA: Diagnosis not present

## 2021-07-23 DIAGNOSIS — I10 Essential (primary) hypertension: Secondary | ICD-10-CM | POA: Diagnosis not present

## 2021-07-23 DIAGNOSIS — E785 Hyperlipidemia, unspecified: Secondary | ICD-10-CM | POA: Diagnosis not present

## 2021-07-23 DIAGNOSIS — F32A Depression, unspecified: Secondary | ICD-10-CM | POA: Diagnosis not present

## 2021-07-23 DIAGNOSIS — K219 Gastro-esophageal reflux disease without esophagitis: Secondary | ICD-10-CM | POA: Diagnosis not present

## 2021-07-23 DIAGNOSIS — M300 Polyarteritis nodosa: Secondary | ICD-10-CM | POA: Diagnosis not present

## 2021-07-23 DIAGNOSIS — G43909 Migraine, unspecified, not intractable, without status migrainosus: Secondary | ICD-10-CM | POA: Diagnosis not present

## 2021-07-23 DIAGNOSIS — E559 Vitamin D deficiency, unspecified: Secondary | ICD-10-CM | POA: Diagnosis not present

## 2021-07-23 DIAGNOSIS — G4733 Obstructive sleep apnea (adult) (pediatric): Secondary | ICD-10-CM | POA: Diagnosis not present

## 2021-07-23 DIAGNOSIS — G9589 Other specified diseases of spinal cord: Secondary | ICD-10-CM | POA: Diagnosis not present

## 2021-07-23 DIAGNOSIS — W19XXXD Unspecified fall, subsequent encounter: Secondary | ICD-10-CM | POA: Diagnosis not present

## 2021-07-23 DIAGNOSIS — K259 Gastric ulcer, unspecified as acute or chronic, without hemorrhage or perforation: Secondary | ICD-10-CM | POA: Diagnosis not present

## 2021-07-23 DIAGNOSIS — M4802 Spinal stenosis, cervical region: Secondary | ICD-10-CM | POA: Diagnosis not present

## 2021-07-23 DIAGNOSIS — I872 Venous insufficiency (chronic) (peripheral): Secondary | ICD-10-CM | POA: Diagnosis not present

## 2021-07-23 DIAGNOSIS — E538 Deficiency of other specified B group vitamins: Secondary | ICD-10-CM | POA: Diagnosis not present

## 2021-07-23 DIAGNOSIS — Z9181 History of falling: Secondary | ICD-10-CM | POA: Diagnosis not present

## 2021-07-23 DIAGNOSIS — G629 Polyneuropathy, unspecified: Secondary | ICD-10-CM | POA: Diagnosis not present

## 2021-07-23 DIAGNOSIS — R350 Frequency of micturition: Secondary | ICD-10-CM | POA: Diagnosis not present

## 2021-07-24 DIAGNOSIS — E785 Hyperlipidemia, unspecified: Secondary | ICD-10-CM | POA: Diagnosis not present

## 2021-07-24 DIAGNOSIS — G4733 Obstructive sleep apnea (adult) (pediatric): Secondary | ICD-10-CM | POA: Diagnosis not present

## 2021-07-24 DIAGNOSIS — M300 Polyarteritis nodosa: Secondary | ICD-10-CM | POA: Diagnosis not present

## 2021-07-24 DIAGNOSIS — G43909 Migraine, unspecified, not intractable, without status migrainosus: Secondary | ICD-10-CM | POA: Diagnosis not present

## 2021-07-24 DIAGNOSIS — E538 Deficiency of other specified B group vitamins: Secondary | ICD-10-CM | POA: Diagnosis not present

## 2021-07-24 DIAGNOSIS — G629 Polyneuropathy, unspecified: Secondary | ICD-10-CM | POA: Diagnosis not present

## 2021-07-24 DIAGNOSIS — R350 Frequency of micturition: Secondary | ICD-10-CM | POA: Diagnosis not present

## 2021-07-24 DIAGNOSIS — F32A Depression, unspecified: Secondary | ICD-10-CM | POA: Diagnosis not present

## 2021-07-24 DIAGNOSIS — R238 Other skin changes: Secondary | ICD-10-CM | POA: Diagnosis not present

## 2021-07-24 DIAGNOSIS — W19XXXD Unspecified fall, subsequent encounter: Secondary | ICD-10-CM | POA: Diagnosis not present

## 2021-07-24 DIAGNOSIS — K259 Gastric ulcer, unspecified as acute or chronic, without hemorrhage or perforation: Secondary | ICD-10-CM | POA: Diagnosis not present

## 2021-07-24 DIAGNOSIS — K760 Fatty (change of) liver, not elsewhere classified: Secondary | ICD-10-CM | POA: Diagnosis not present

## 2021-07-24 DIAGNOSIS — M5136 Other intervertebral disc degeneration, lumbar region: Secondary | ICD-10-CM | POA: Diagnosis not present

## 2021-07-24 DIAGNOSIS — L989 Disorder of the skin and subcutaneous tissue, unspecified: Secondary | ICD-10-CM | POA: Diagnosis not present

## 2021-07-24 DIAGNOSIS — E559 Vitamin D deficiency, unspecified: Secondary | ICD-10-CM | POA: Diagnosis not present

## 2021-07-24 DIAGNOSIS — M4802 Spinal stenosis, cervical region: Secondary | ICD-10-CM | POA: Diagnosis not present

## 2021-07-24 DIAGNOSIS — M797 Fibromyalgia: Secondary | ICD-10-CM | POA: Diagnosis not present

## 2021-07-24 DIAGNOSIS — Z9181 History of falling: Secondary | ICD-10-CM | POA: Diagnosis not present

## 2021-07-24 DIAGNOSIS — G9529 Other cord compression: Secondary | ICD-10-CM | POA: Diagnosis not present

## 2021-07-24 DIAGNOSIS — N390 Urinary tract infection, site not specified: Secondary | ICD-10-CM | POA: Diagnosis not present

## 2021-07-24 DIAGNOSIS — G9589 Other specified diseases of spinal cord: Secondary | ICD-10-CM | POA: Diagnosis not present

## 2021-07-24 DIAGNOSIS — K219 Gastro-esophageal reflux disease without esophagitis: Secondary | ICD-10-CM | POA: Diagnosis not present

## 2021-07-24 DIAGNOSIS — I10 Essential (primary) hypertension: Secondary | ICD-10-CM | POA: Diagnosis not present

## 2021-07-24 DIAGNOSIS — I872 Venous insufficiency (chronic) (peripheral): Secondary | ICD-10-CM | POA: Diagnosis not present

## 2021-07-26 ENCOUNTER — Telehealth: Admit: 2021-07-26 | Discharge: 2021-07-27 | Payer: MEDICARE

## 2021-07-26 DIAGNOSIS — L400 Psoriasis vulgaris: Secondary | ICD-10-CM | POA: Diagnosis not present

## 2021-07-26 DIAGNOSIS — L958 Other vasculitis limited to the skin: Secondary | ICD-10-CM | POA: Diagnosis not present

## 2021-07-26 DIAGNOSIS — I890 Lymphedema, not elsewhere classified: Secondary | ICD-10-CM | POA: Diagnosis not present

## 2021-07-26 DIAGNOSIS — L52 Erythema nodosum: Secondary | ICD-10-CM | POA: Diagnosis not present

## 2021-07-26 DIAGNOSIS — Z79899 Other long term (current) drug therapy: Secondary | ICD-10-CM | POA: Diagnosis not present

## 2021-07-26 MED ORDER — SKYRIZI 150 MG/ML SUBCUTANEOUS PEN INJECTOR
3 refills | 0 days | Status: CP
Start: 2021-07-26 — End: ?
  Filled 2021-07-28: qty 1, 84d supply, fill #0

## 2021-07-26 NOTE — Unmapped (Signed)
[facility] releases most results to you as soon as they are available. Therefore, you may see some results before we do. Please give us 3 business days to review the tests and contact you by phone or through MyChart. If you are concerned that some results may be upsetting or confusing, you may wish to wait until we contact you before looking at the report in MyChart.   If you have an urgent question, you can send us a message or call our clinic. Otherwise, we prefer that you wait 3 business days for us to contact you.    Dermatology Clinical Team

## 2021-07-26 NOTE — Unmapped (Signed)
The patient reports they are currently: at home. I spent 8 minutes on the real-time audio and video with the patient on the date of service. I spent an additional 7 minutes on pre- and post-visit activities on the date of service.     The patient was physically located in [place] or a state in which I am permitted to provide care. The patient and/or parent/guardian understood that s/he may incur co-pays and cost sharing, and agreed to the telemedicine visit. The visit was reasonable and appropriate under the circumstances given the patient's presentation at the time.    The patient and/or parent/guardian has been advised of the potential risks and limitations of this mode of treatment (including, but not limited to, the absence of in-person examination) and has agreed to be treated using telemedicine. The patient's/patient's family's questions regarding telemedicine have been answered.     If the visit was completed in an ambulatory setting, the patient and/or parent/guardian has also been advised to contact their provider???s office for worsening conditions, and seek emergency medical treatment and/or call 911 if the patient deems either necessary.      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 Eyvonne Left favoring nodular vasculitis/erythema induratum.  - Suspect morbid obesity contributes to impedence of both venous and lymphatic return resulting in stasis changes (secondary lymphedema)   - Unable to tolerate compression that she initiated after last visit; she has tried multiple other compression garment before including ones with velcro and she is unable to tolerate them.  - Continue Lymphedema massages PRN; insurance will not pay for lymphedema pump  - The EI/NV has remained quiescent for several years now  ??  Plaque psoriasis: chronic, stable, well controlled on Skyrizi  - will re-order risankizumab-rzaa (SKYRIZI) 150 mg/mL Syrg; Inject the contents of 1 syringe (150 mg) under the skin every 12 weeks.  - on reserve, but has not needed: triamcinolone (KENALOG) 0.1 % ointment; Apply topically Two (2) times a day.  - tolerating well without SE  ??  High risk medication use:  - negative quant gold on 09/19/2019  - re-ordered today    The patient was advised to call for an appointment should any new, changing, or symptomatic lesions develop.     RTC: Return in about 1 year (around 07/27/2022) for f/u psoriasis. or sooner as needed   _________________________________________________________________      Chief Complaint      follow up psoriasis    HPI     Autumn Mcdonald is a 51 y.o. female who presents as a returning patient (last seen 03/27/2020) to Dermatology for follow up of psoriasis. She reports having a fall over Thanksgiving so she is in therapy for this and unable to ambulate.  Denies hospitalization for cellulitis.  Psoriasis well controlled on Skyrizi.  No injection site reactions.  Notices a few psoriatic plaques come up prior to her next injection, but otherwise working very well.  Has not needed topical corticosteroids.     The patient denies any other new or changing lesions or areas of concern.     Pertinent Past Medical History       Past Medical History, Family History, Social History, Medication List, Allergies, and Problem List were reviewed in the rooming section of Epic.     ROS: Other than symptoms mentioned in the HPI, no fevers, chills, or  other skin complaints    Physical Examination     GENERAL: Well-appearing female in no acute distress, resting comfortably.  NEURO: Alert and oriented, answers questions appropriately  PSYCH: Normal mood and affect  Patient declined to upload photos, but reports psoriasis is inactive    (Approved Template 01/27/2020)

## 2021-07-27 ENCOUNTER — Telehealth: Payer: Self-pay

## 2021-07-27 DIAGNOSIS — I10 Essential (primary) hypertension: Secondary | ICD-10-CM | POA: Diagnosis not present

## 2021-07-27 DIAGNOSIS — G4733 Obstructive sleep apnea (adult) (pediatric): Secondary | ICD-10-CM | POA: Diagnosis not present

## 2021-07-27 DIAGNOSIS — N390 Urinary tract infection, site not specified: Secondary | ICD-10-CM | POA: Diagnosis not present

## 2021-07-27 DIAGNOSIS — R350 Frequency of micturition: Secondary | ICD-10-CM | POA: Diagnosis not present

## 2021-07-27 DIAGNOSIS — M797 Fibromyalgia: Secondary | ICD-10-CM | POA: Diagnosis not present

## 2021-07-27 DIAGNOSIS — K259 Gastric ulcer, unspecified as acute or chronic, without hemorrhage or perforation: Secondary | ICD-10-CM | POA: Diagnosis not present

## 2021-07-27 DIAGNOSIS — M5136 Other intervertebral disc degeneration, lumbar region: Secondary | ICD-10-CM | POA: Diagnosis not present

## 2021-07-27 DIAGNOSIS — E538 Deficiency of other specified B group vitamins: Secondary | ICD-10-CM | POA: Diagnosis not present

## 2021-07-27 DIAGNOSIS — G9529 Other cord compression: Secondary | ICD-10-CM | POA: Diagnosis not present

## 2021-07-27 DIAGNOSIS — W19XXXD Unspecified fall, subsequent encounter: Secondary | ICD-10-CM | POA: Diagnosis not present

## 2021-07-27 DIAGNOSIS — I872 Venous insufficiency (chronic) (peripheral): Secondary | ICD-10-CM | POA: Diagnosis not present

## 2021-07-27 DIAGNOSIS — E559 Vitamin D deficiency, unspecified: Secondary | ICD-10-CM | POA: Diagnosis not present

## 2021-07-27 DIAGNOSIS — K760 Fatty (change of) liver, not elsewhere classified: Secondary | ICD-10-CM | POA: Diagnosis not present

## 2021-07-27 DIAGNOSIS — M300 Polyarteritis nodosa: Secondary | ICD-10-CM | POA: Diagnosis not present

## 2021-07-27 DIAGNOSIS — G9589 Other specified diseases of spinal cord: Secondary | ICD-10-CM | POA: Diagnosis not present

## 2021-07-27 DIAGNOSIS — M4802 Spinal stenosis, cervical region: Secondary | ICD-10-CM | POA: Diagnosis not present

## 2021-07-27 DIAGNOSIS — Z9181 History of falling: Secondary | ICD-10-CM | POA: Diagnosis not present

## 2021-07-27 DIAGNOSIS — R238 Other skin changes: Secondary | ICD-10-CM | POA: Diagnosis not present

## 2021-07-27 DIAGNOSIS — L989 Disorder of the skin and subcutaneous tissue, unspecified: Secondary | ICD-10-CM | POA: Diagnosis not present

## 2021-07-27 DIAGNOSIS — F32A Depression, unspecified: Secondary | ICD-10-CM | POA: Diagnosis not present

## 2021-07-27 DIAGNOSIS — G629 Polyneuropathy, unspecified: Secondary | ICD-10-CM | POA: Diagnosis not present

## 2021-07-27 DIAGNOSIS — E785 Hyperlipidemia, unspecified: Secondary | ICD-10-CM | POA: Diagnosis not present

## 2021-07-27 DIAGNOSIS — G43909 Migraine, unspecified, not intractable, without status migrainosus: Secondary | ICD-10-CM | POA: Diagnosis not present

## 2021-07-27 DIAGNOSIS — K219 Gastro-esophageal reflux disease without esophagitis: Secondary | ICD-10-CM | POA: Diagnosis not present

## 2021-07-27 NOTE — Unmapped (Signed)
[facility] Refill Coordination Note    Specialty Medication(s) to be Shipped:   Inflammatory Disorders: Skyrizi    Other medication(s) to be shipped: No additional medications requested for fill at this time     Autumn Mcdonald, DOB: [birth year]  Phone: [phone] (home)       All above HIPAA information was verified with patient.     Was a Nurse, learning disability used for this call? No    Completed refill call assessment today to schedule patient's medication shipment from the [facility] [phone]).  All relevant notes have been reviewed.     Specialty medication(s) and dose(s) confirmed: Regimen is correct and unchanged.   Changes to medications: Shereese reports no changes at this time.  Changes to insurance: No  New side effects reported not previously addressed with a pharmacist or physician: None reported  Questions for the pharmacist: No    Confirmed patient received a Conservation officer, historic buildings and a Surveyor, mining with first shipment. The patient will receive a drug information handout for each medication shipped and additional FDA Medication Guides as required.       DISEASE/MEDICATION-SPECIFIC INFORMATION        For patients on injectable medications: Patient currently has 0 doses left.  Next injection is scheduled for 08/05/2021.    SPECIALTY MEDICATION ADHERENCE     Medication Adherence    Patient reported X missed doses in the last month: 0  Specialty Medication: Skyrizi  Patient is on additional specialty medications: No  Any gaps in refill history greater than 2 weeks in the last 3 months: no  Demonstrates understanding of importance of adherence: yes  Informant: patient  Reliability of informant: reliable  Confirmed plan for next specialty medication refill: delivery by pharmacy  Refills needed for supportive medications: not needed              Were doses missed due to medication being on hold? No    Skyrizi 150 mg/ml: 0 days of medicine on hand       REFERRAL TO PHARMACIST Referral to the pharmacist: Not needed      [facility]     Shipping address confirmed in Epic.     Delivery Scheduled: Yes, Expected medication delivery date: 07/29/2021.     Medication will be delivered via UPS to the prescription address in Epic WAM.    Tin Engram D Earlie Schank   [facility] Specialty Technician

## 2021-07-27 NOTE — Telephone Encounter (Signed)
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 ?On DPR? :Yes ? ?Call back [phone] ? ?Provider they see: Birdie Riddle  ? ?Reason for call:Pt has scabs on lower extremity they have been monitoring and left shin is getting warm to touch and red possible cellulitis [facility] recommends a virtual appt  ? ?

## 2021-07-27 NOTE — Telephone Encounter (Signed)
Please schedule virtual for pt -possible cellulitis  ?

## 2021-07-27 NOTE — Telephone Encounter (Signed)
Sent a my chart message.

## 2021-07-27 NOTE — Telephone Encounter (Signed)
Called mail box is full will try to call back 07/27/21 '@4'$ :36 ?

## 2021-07-28 ENCOUNTER — Encounter: Payer: Self-pay | Admitting: Family Medicine

## 2021-07-28 ENCOUNTER — Telehealth (INDEPENDENT_AMBULATORY_CARE_PROVIDER_SITE_OTHER): Payer: Medicare Other | Admitting: Family Medicine

## 2021-07-28 DIAGNOSIS — L03115 Cellulitis of right lower limb: Secondary | ICD-10-CM

## 2021-07-28 MED ORDER — DOXYCYCLINE HYCLATE 100 MG PO TABS: 100.0000 mg | ORAL_TABLET | Freq: Two times a day (BID) | ORAL | 0 refills | Status: DC

## 2021-07-28 NOTE — Progress Notes (Signed)
? ?Virtual Visit via Video  ? ?I connected with patient on 07/28/21 at  9:45 AM EDT by a video enabled telemedicine application and verified that I am speaking with the correct person using two identifiers. ? ?Location patient: Home ?Location provider: [facility], Office ?Persons participating in the virtual visit: Patient, Provider, [place] Claiborne Billings C) ? ?I discussed the limitations of evaluation and management by telemedicine and the availability of in person appointments. The patient expressed understanding and agreed to proceed. ? ?Subjective:  ? ?HPI:  ? ?Cellulitis- HH nurse looked at her legs yesterday and noted there was increased redness and warmth.  She encouraged pt to reach out as pt has hx of recurrent infxns.  Last week legs were red but not warm.  Yesterday legs were warm to touch.  Pt denies pain above baseline but is currently bed bound and at high risk for sepsis if infection left untreated. ? ?ROS:  ? ?See pertinent positives and negatives per HPI. ? ?Patient Active Problem List  ? Diagnosis Date Noted  ? Sacral pressure sore 07/06/2021  ? Vitamin B6 deficiency 06/08/2021  ? Myelomalacia of cervical cord ([place]) 06/06/2021  ? Compressive cervical cord myelomalacia ([place]) 06/05/2021  ? B12 deficiency 06/05/2021  ? Folate deficiency 06/05/2021  ? Vitamin D deficiency 06/05/2021  ? Leg weakness, bilateral 06/04/2021  ? Cervical atypia 01/06/2021  ? Steatosis of liver 01/06/2021  ? Hyponatremia 07/27/2019  ? Urinary urgency 04/04/2018  ? Chronic narcotic use 09/07/2016  ? Acute blood loss anemia 01/14/2016  ? Multiple gastric ulcers   ? PAN (polyarteritis nodosa) ([place]) 11/24/2015  ? GERD (gastroesophageal reflux disease) 01/08/2014  ? Routine general medical examination at a health care facility 04/23/2013  ? Bariatric surgery status 10/31/2012  ? S/P skin biopsy 10/31/2012  ? Psoriasis 01/09/2012  ? DDD (degenerative disc disease), lumbar 11/30/2011  ? Migraine headache   ? [name] (obstructive sleep  apnea) 11/16/2010  ? HYPERGLYCEMIA, FASTING 10/08/2009  ? CONTRACTURE OF TENDON 08/17/2009  ? PARESTHESIA, HANDS 12/23/2008  ? Leg pain, right 11/05/2008  ? ADVERSE DRUG REACTION 04/03/2008  ? PERIPHERAL EDEMA 03/26/2008  ? Obesity, Class III, BMI 40-49.9 (morbid obesity) ([place]) 01/03/2007  ? Cellulitis 01/03/2007  ? Hyperlipidemia 09/22/2006  ? Fibromyalgia 09/20/2006  ? Anxiety and depression 06/28/2006  ? Essential hypertension 06/28/2006  ?  ?Social History  ? ?Tobacco Use  ? Smoking status: Never  ? Smokeless tobacco: Never  ?Substance Use Topics  ? Alcohol use: No  ? ? ?Current Outpatient Medications:  ?  ALPRAZolam (XANAX) 0.5 MG tablet, Take 1 tablet (0.5 mg total) by mouth 2 (two) times daily., Disp: 30 tablet, Rfl: 3 ?  ARIPiprazole (ABILIFY) 10 MG tablet, Take 10 mg by mouth in the morning. , Disp: , Rfl:  ?  Armodafinil 250 MG tablet, Take 250 mg by mouth 3 (three) times daily. 8am, 2pm, 6pm, Disp: , Rfl:  ?  Biotin 10 MG TABS, Take 10 mg by mouth in the morning. , Disp: , Rfl:  ?  buPROPion (WELLBUTRIN XL) 300 MG 24 hr tablet, TAKE 1 TABLET BY MOUTH EVERY DAY (Patient taking differently: 300 mg daily.), Disp: 30 tablet, Rfl: 2 ?  busPIRone (BUSPAR) 30 MG tablet, TAKE 1 TABLET BY MOUTH 2 TIMES DAILY (Patient taking differently: Take 30 mg by mouth 2 (two) times daily.), Disp: 60 tablet, Rfl: 2 ?  calcium citrate (CALCITRATE - DOSED IN MG ELEMENTAL CALCIUM) 950 MG tablet, Take 1 tablet by mouth daily., Disp: , Rfl:  ?  clonazePAM (KLONOPIN) 1 MG tablet, TAKE 1 TABLET BY MOUTH AT BEDTIME (Patient taking differently: Take 1 mg by mouth daily.), Disp: 30 tablet, Rfl: 3 ?  collagenase (SANTYL) ointment, Apply topically daily. apply Santyl to scabs on legs (Patient taking differently: Apply 1 application. topically daily. apply Santyl to scabs on legs), Disp: 15 g, Rfl: 0 ?  cyclobenzaprine (FLEXERIL) 10 MG tablet, TAKE 1 TABLET BY MOUTH 3 TIMES DAILY AS NEEDED (Patient taking differently: Take 10 mg by mouth 2  (two) times daily.), Disp: 270 tablet, Rfl: 2 ?  diclofenac Sodium (VOLTAREN) 1 % GEL, Apply 4 g topically 4 (four) times daily., Disp: , Rfl:  ?  diphenhydrAMINE (BENADRYL) 25 MG tablet, Take 25 mg by mouth every 6 (six) hours as needed for allergies., Disp: , Rfl:  ?  DULoxetine (CYMBALTA) 60 MG capsule, Take 2 capsules (120 mg total) by mouth daily. (Patient taking differently: Take 60 mg by mouth 2 (two) times daily.), Disp: 240 capsule, Rfl: 2 ?  FEROSUL 325 (65 Fe) MG tablet, TAKE 1 TABLET BY MOUTH EVERY DAY WITH BREAKFAST (Patient taking differently: Take 325 mg by mouth daily with breakfast.), Disp: 30 tablet, Rfl: 2 ?  folic acid (FOLVITE) 1 MG tablet, Take 1 tablet (1 mg total) by mouth daily., Disp: 30 tablet, Rfl: 2 ?  furosemide (LASIX) 40 MG tablet, TAKE 1 TABLET BY MOUTH EVERY DAY MAY take additional TABLET IF weight INCREASE by 5lb AND call MD FOR further instructions (Patient taking differently: Take 40 mg by mouth daily. 1t qd-may take an additional tablet if weight increases by 5lbs.), Disp: 30 tablet, Rfl: 1 ?  gabapentin (NEURONTIN) 600 MG tablet, Take 1 tablet (600 mg total) by mouth 3 (three) times daily. TAKE 2 TABLETS BY MOUTH 3 TIMES DAILY (Patient taking differently: Take 600-1,200 mg by mouth 3 (three) times daily. Takes 1 tablet in the morning and afternoon and 2 tablets at night.), Disp: 270 tablet, Rfl: 0 ?  HYDROcodone-acetaminophen (NORCO) 10-325 MG tablet, TAKE 1 TABLET BY MOUTH EVERY 6 HOURS AS NEEDED, Disp: 90 tablet, Rfl: 0 ?  nebivolol (BYSTOLIC) 10 MG tablet, TAKE 1 TABLET BY MOUTH EVERY DAY (Patient taking differently: Take 10 mg by mouth daily.), Disp: 30 tablet, Rfl: 2 ?  nystatin (MYCOSTATIN/NYSTOP) powder, APPLY 1 APPLICATION TOPICALLY 3 TIMES DAILY, Disp: 60 g, Rfl: 3 ?  Oxcarbazepine (TRILEPTAL) 300 MG tablet, Take 300 mg by mouth 2 (two) times daily., Disp: , Rfl:  ?  pantoprazole (PROTONIX) 40 MG tablet, TAKE 1 TABLET BY MOUTH 2 TIMES DAILY (Patient taking differently:  40 mg 2 (two) times daily before a meal.), Disp: 60 tablet, Rfl: 2 ?  phentermine 37.5 MG capsule, TAKE 1 CAPSULE BY MOUTH EVERY MORNING (Patient taking differently: Take 37.5 mg by mouth daily.), Disp: 90 capsule, Rfl: 0 ?  promethazine (PHENERGAN) 25 MG tablet, TAKE 1 TABLET BY MOUTH EVERY 6 HOURS AS NEEDED FOR NAUSEA AND vomiting, Disp: 45 tablet, Rfl: 3 ?  pyridOXINE (B-6) 100 MG tablet, Take 1 tablet (100 mg total) by mouth daily., Disp: 30 tablet, Rfl: 2 ?  rosuvastatin (CRESTOR) 20 MG tablet, TAKE 1 TABLET BY MOUTH EVERY DAY (Patient taking differently: Take 20 mg by mouth daily.), Disp: 90 tablet, Rfl: 2 ?  senna-docusate (SENOKOT-S) 8.6-50 MG tablet, Take 1 tablet by mouth at bedtime. (Patient taking differently: Take 1 tablet by mouth daily as needed for mild constipation.), Disp: 30 tablet, Rfl: 0 ?  SKYRIZI 150 MG/ML SOSY, Inject 150  mg into the skin every 3 (three) months., Disp: , Rfl:  ?  SUMAtriptan (IMITREX) 100 MG tablet, TAKE 1 TABLET BY MOUTH AT migraine ONSET, MAY REPEAT in 2 hours IF HEADACHE persists OR recurs (Patient taking differently: Take 100 mg by mouth every 2 (two) hours as needed for migraine.), Disp: 10 tablet, Rfl: 1 ?  thiamine 100 MG tablet, Take 1 tablet (100 mg total) by mouth daily., Disp: 30 tablet, Rfl: 2 ?  vitamin B-12 (CYANOCOBALAMIN) 1000 MCG tablet, Take 1 tablet (1,000 mcg total) by mouth daily., Disp: 30 tablet, Rfl: 2 ? ?Allergies  ?Allergen Reactions  ? Niacin Anaphylaxis, Shortness Of Breath and Swelling  ?  Swelling and "problems breathing"  ? Sulfamethoxazole-Trimethoprim Anaphylaxis, Swelling, Rash and Other (See Comments)  ?  Throat closed and the eyes became swollen  ? Aspirin Hives  ? Bactrim Hives  ? Benzoin Compound Rash  ? Cephalexin Hives  ?  Tolerated ceftriaxone and cefepime 07/2019  ? Clindamycin/Lincomycin Hives  ? Iohexol Hives  ?  Pt treated with PO benedryl  ? Lisinopril Hives  ? Naproxen Hives  ? Pnu-Imune [Pneumococcal Polysaccharide Vaccine] Rash   ? Sulfonamide Derivatives Rash  ? ? ?Objective:  ? ?There were no vitals taken for this visit. ?AAOx3, NAD ?obese ?NCAT, EOMI ?No obvious CN deficits ?Pt is able to speak clearly, coherently without

## 2021-07-28 NOTE — Telephone Encounter (Signed)
Patient has been contacted and scheduled with Dr Birdie Riddle today ?

## 2021-07-29 ENCOUNTER — Telehealth: Payer: Self-pay

## 2021-07-29 NOTE — Telephone Encounter (Signed)
Pt is asking if she is supposed to take Pentoxifylline '400mg'$   ?

## 2021-07-29 NOTE — Telephone Encounter (Signed)
Caller name:Nikiesha Neubert  ? ?On DPR? :Yes ? ?Call back number:[phone] ? ?Provider they see: Birdie Riddle  ? ?Reason for call:Pentoxifylline '400mg'$  on Feb 1 visit she had come off medication and [facility] trying to fill and pt is wanting to touch base is this a medication that she will need to continue or stay off of?  ? ?

## 2021-07-30 NOTE — Telephone Encounter (Signed)
Called, MB full could not leave message  ?

## 2021-07-30 NOTE — Telephone Encounter (Signed)
She told us during her visit on 2/13 that she was not taking it- which is why it was removed from her list.  I am not the prescriber for this medication so she needs to contact who wrote the prescription to determine whether she should continue.  (I would assume unless she was told to stop, she should continue) ?

## 2021-07-30 NOTE — Telephone Encounter (Signed)
Called patient and made her aware of what Dr Birdie Riddle advised, she understood. She wanted to know if her flexeril could be changed, she has been taking 2 tabs 2 times daily. She said that's really been helping. She knows that it will be Monday before this can be managed ?

## 2021-07-31 DIAGNOSIS — K259 Gastric ulcer, unspecified as acute or chronic, without hemorrhage or perforation: Secondary | ICD-10-CM | POA: Diagnosis not present

## 2021-07-31 DIAGNOSIS — G4733 Obstructive sleep apnea (adult) (pediatric): Secondary | ICD-10-CM | POA: Diagnosis not present

## 2021-07-31 DIAGNOSIS — E559 Vitamin D deficiency, unspecified: Secondary | ICD-10-CM | POA: Diagnosis not present

## 2021-07-31 DIAGNOSIS — M4802 Spinal stenosis, cervical region: Secondary | ICD-10-CM | POA: Diagnosis not present

## 2021-07-31 DIAGNOSIS — N390 Urinary tract infection, site not specified: Secondary | ICD-10-CM | POA: Diagnosis not present

## 2021-07-31 DIAGNOSIS — G9589 Other specified diseases of spinal cord: Secondary | ICD-10-CM | POA: Diagnosis not present

## 2021-07-31 DIAGNOSIS — K219 Gastro-esophageal reflux disease without esophagitis: Secondary | ICD-10-CM | POA: Diagnosis not present

## 2021-07-31 DIAGNOSIS — I872 Venous insufficiency (chronic) (peripheral): Secondary | ICD-10-CM | POA: Diagnosis not present

## 2021-07-31 DIAGNOSIS — M5136 Other intervertebral disc degeneration, lumbar region: Secondary | ICD-10-CM | POA: Diagnosis not present

## 2021-07-31 DIAGNOSIS — E785 Hyperlipidemia, unspecified: Secondary | ICD-10-CM | POA: Diagnosis not present

## 2021-07-31 DIAGNOSIS — G629 Polyneuropathy, unspecified: Secondary | ICD-10-CM | POA: Diagnosis not present

## 2021-07-31 DIAGNOSIS — M300 Polyarteritis nodosa: Secondary | ICD-10-CM | POA: Diagnosis not present

## 2021-07-31 DIAGNOSIS — G43909 Migraine, unspecified, not intractable, without status migrainosus: Secondary | ICD-10-CM | POA: Diagnosis not present

## 2021-07-31 DIAGNOSIS — G9529 Other cord compression: Secondary | ICD-10-CM | POA: Diagnosis not present

## 2021-07-31 DIAGNOSIS — L989 Disorder of the skin and subcutaneous tissue, unspecified: Secondary | ICD-10-CM | POA: Diagnosis not present

## 2021-07-31 DIAGNOSIS — E538 Deficiency of other specified B group vitamins: Secondary | ICD-10-CM | POA: Diagnosis not present

## 2021-07-31 DIAGNOSIS — K760 Fatty (change of) liver, not elsewhere classified: Secondary | ICD-10-CM | POA: Diagnosis not present

## 2021-07-31 DIAGNOSIS — W19XXXD Unspecified fall, subsequent encounter: Secondary | ICD-10-CM | POA: Diagnosis not present

## 2021-07-31 DIAGNOSIS — R238 Other skin changes: Secondary | ICD-10-CM | POA: Diagnosis not present

## 2021-07-31 DIAGNOSIS — R350 Frequency of micturition: Secondary | ICD-10-CM | POA: Diagnosis not present

## 2021-07-31 DIAGNOSIS — Z9181 History of falling: Secondary | ICD-10-CM | POA: Diagnosis not present

## 2021-07-31 DIAGNOSIS — F32A Depression, unspecified: Secondary | ICD-10-CM | POA: Diagnosis not present

## 2021-07-31 DIAGNOSIS — M797 Fibromyalgia: Secondary | ICD-10-CM | POA: Diagnosis not present

## 2021-07-31 DIAGNOSIS — I10 Essential (primary) hypertension: Secondary | ICD-10-CM | POA: Diagnosis not present

## 2021-08-02 ENCOUNTER — Other Ambulatory Visit: Payer: Self-pay | Admitting: Family Medicine

## 2021-08-02 DIAGNOSIS — G43909 Migraine, unspecified, not intractable, without status migrainosus: Secondary | ICD-10-CM | POA: Diagnosis not present

## 2021-08-02 DIAGNOSIS — M5136 Other intervertebral disc degeneration, lumbar region: Secondary | ICD-10-CM | POA: Diagnosis not present

## 2021-08-02 DIAGNOSIS — I872 Venous insufficiency (chronic) (peripheral): Secondary | ICD-10-CM | POA: Diagnosis not present

## 2021-08-02 DIAGNOSIS — G9589 Other specified diseases of spinal cord: Secondary | ICD-10-CM | POA: Diagnosis not present

## 2021-08-02 DIAGNOSIS — R350 Frequency of micturition: Secondary | ICD-10-CM | POA: Diagnosis not present

## 2021-08-02 DIAGNOSIS — L989 Disorder of the skin and subcutaneous tissue, unspecified: Secondary | ICD-10-CM | POA: Diagnosis not present

## 2021-08-02 DIAGNOSIS — M797 Fibromyalgia: Secondary | ICD-10-CM | POA: Diagnosis not present

## 2021-08-02 DIAGNOSIS — G629 Polyneuropathy, unspecified: Secondary | ICD-10-CM | POA: Diagnosis not present

## 2021-08-02 DIAGNOSIS — E559 Vitamin D deficiency, unspecified: Secondary | ICD-10-CM | POA: Diagnosis not present

## 2021-08-02 DIAGNOSIS — K259 Gastric ulcer, unspecified as acute or chronic, without hemorrhage or perforation: Secondary | ICD-10-CM | POA: Diagnosis not present

## 2021-08-02 DIAGNOSIS — E538 Deficiency of other specified B group vitamins: Secondary | ICD-10-CM | POA: Diagnosis not present

## 2021-08-02 DIAGNOSIS — M300 Polyarteritis nodosa: Secondary | ICD-10-CM | POA: Diagnosis not present

## 2021-08-02 DIAGNOSIS — R238 Other skin changes: Secondary | ICD-10-CM | POA: Diagnosis not present

## 2021-08-02 DIAGNOSIS — G4733 Obstructive sleep apnea (adult) (pediatric): Secondary | ICD-10-CM | POA: Diagnosis not present

## 2021-08-02 DIAGNOSIS — K760 Fatty (change of) liver, not elsewhere classified: Secondary | ICD-10-CM | POA: Diagnosis not present

## 2021-08-02 DIAGNOSIS — E785 Hyperlipidemia, unspecified: Secondary | ICD-10-CM | POA: Diagnosis not present

## 2021-08-02 DIAGNOSIS — N390 Urinary tract infection, site not specified: Secondary | ICD-10-CM | POA: Diagnosis not present

## 2021-08-02 DIAGNOSIS — M4802 Spinal stenosis, cervical region: Secondary | ICD-10-CM | POA: Diagnosis not present

## 2021-08-02 DIAGNOSIS — W19XXXD Unspecified fall, subsequent encounter: Secondary | ICD-10-CM | POA: Diagnosis not present

## 2021-08-02 DIAGNOSIS — K219 Gastro-esophageal reflux disease without esophagitis: Secondary | ICD-10-CM | POA: Diagnosis not present

## 2021-08-02 DIAGNOSIS — Z9181 History of falling: Secondary | ICD-10-CM | POA: Diagnosis not present

## 2021-08-02 DIAGNOSIS — G9529 Other cord compression: Secondary | ICD-10-CM | POA: Diagnosis not present

## 2021-08-02 DIAGNOSIS — F32A Depression, unspecified: Secondary | ICD-10-CM | POA: Diagnosis not present

## 2021-08-02 DIAGNOSIS — I10 Essential (primary) hypertension: Secondary | ICD-10-CM | POA: Diagnosis not present

## 2021-08-02 MED ORDER — CYCLOBENZAPRINE HCL 10 MG PO TABS: 20.0000 mg | ORAL_TABLET | Freq: Two times a day (BID) | ORAL | 2 refills | Status: DC

## 2021-08-02 NOTE — Telephone Encounter (Signed)
Prescription updated to reflect the change ?

## 2021-08-03 DIAGNOSIS — I10 Essential (primary) hypertension: Secondary | ICD-10-CM | POA: Diagnosis not present

## 2021-08-03 DIAGNOSIS — G43909 Migraine, unspecified, not intractable, without status migrainosus: Secondary | ICD-10-CM | POA: Diagnosis not present

## 2021-08-03 DIAGNOSIS — E559 Vitamin D deficiency, unspecified: Secondary | ICD-10-CM | POA: Diagnosis not present

## 2021-08-03 DIAGNOSIS — M5136 Other intervertebral disc degeneration, lumbar region: Secondary | ICD-10-CM | POA: Diagnosis not present

## 2021-08-03 DIAGNOSIS — R238 Other skin changes: Secondary | ICD-10-CM | POA: Diagnosis not present

## 2021-08-03 DIAGNOSIS — M797 Fibromyalgia: Secondary | ICD-10-CM | POA: Diagnosis not present

## 2021-08-03 DIAGNOSIS — R350 Frequency of micturition: Secondary | ICD-10-CM | POA: Diagnosis not present

## 2021-08-03 DIAGNOSIS — G4733 Obstructive sleep apnea (adult) (pediatric): Secondary | ICD-10-CM | POA: Diagnosis not present

## 2021-08-03 DIAGNOSIS — I872 Venous insufficiency (chronic) (peripheral): Secondary | ICD-10-CM | POA: Diagnosis not present

## 2021-08-03 DIAGNOSIS — G9589 Other specified diseases of spinal cord: Secondary | ICD-10-CM | POA: Diagnosis not present

## 2021-08-03 DIAGNOSIS — L989 Disorder of the skin and subcutaneous tissue, unspecified: Secondary | ICD-10-CM | POA: Diagnosis not present

## 2021-08-03 DIAGNOSIS — E538 Deficiency of other specified B group vitamins: Secondary | ICD-10-CM | POA: Diagnosis not present

## 2021-08-03 DIAGNOSIS — Z9181 History of falling: Secondary | ICD-10-CM | POA: Diagnosis not present

## 2021-08-03 DIAGNOSIS — E785 Hyperlipidemia, unspecified: Secondary | ICD-10-CM | POA: Diagnosis not present

## 2021-08-03 DIAGNOSIS — W19XXXD Unspecified fall, subsequent encounter: Secondary | ICD-10-CM | POA: Diagnosis not present

## 2021-08-03 DIAGNOSIS — M300 Polyarteritis nodosa: Secondary | ICD-10-CM | POA: Diagnosis not present

## 2021-08-03 DIAGNOSIS — G629 Polyneuropathy, unspecified: Secondary | ICD-10-CM | POA: Diagnosis not present

## 2021-08-03 DIAGNOSIS — G9529 Other cord compression: Secondary | ICD-10-CM | POA: Diagnosis not present

## 2021-08-03 DIAGNOSIS — N390 Urinary tract infection, site not specified: Secondary | ICD-10-CM | POA: Diagnosis not present

## 2021-08-03 DIAGNOSIS — K760 Fatty (change of) liver, not elsewhere classified: Secondary | ICD-10-CM | POA: Diagnosis not present

## 2021-08-03 DIAGNOSIS — F32A Depression, unspecified: Secondary | ICD-10-CM | POA: Diagnosis not present

## 2021-08-03 DIAGNOSIS — M4802 Spinal stenosis, cervical region: Secondary | ICD-10-CM | POA: Diagnosis not present

## 2021-08-03 DIAGNOSIS — K259 Gastric ulcer, unspecified as acute or chronic, without hemorrhage or perforation: Secondary | ICD-10-CM | POA: Diagnosis not present

## 2021-08-03 DIAGNOSIS — K219 Gastro-esophageal reflux disease without esophagitis: Secondary | ICD-10-CM | POA: Diagnosis not present

## 2021-08-06 ENCOUNTER — Telehealth: Payer: Self-pay

## 2021-08-06 DIAGNOSIS — G4733 Obstructive sleep apnea (adult) (pediatric): Secondary | ICD-10-CM | POA: Diagnosis not present

## 2021-08-06 DIAGNOSIS — K760 Fatty (change of) liver, not elsewhere classified: Secondary | ICD-10-CM | POA: Diagnosis not present

## 2021-08-06 DIAGNOSIS — R350 Frequency of micturition: Secondary | ICD-10-CM | POA: Diagnosis not present

## 2021-08-06 DIAGNOSIS — R238 Other skin changes: Secondary | ICD-10-CM | POA: Diagnosis not present

## 2021-08-06 DIAGNOSIS — K219 Gastro-esophageal reflux disease without esophagitis: Secondary | ICD-10-CM | POA: Diagnosis not present

## 2021-08-06 DIAGNOSIS — M300 Polyarteritis nodosa: Secondary | ICD-10-CM | POA: Diagnosis not present

## 2021-08-06 DIAGNOSIS — K259 Gastric ulcer, unspecified as acute or chronic, without hemorrhage or perforation: Secondary | ICD-10-CM | POA: Diagnosis not present

## 2021-08-06 DIAGNOSIS — M797 Fibromyalgia: Secondary | ICD-10-CM | POA: Diagnosis not present

## 2021-08-06 DIAGNOSIS — E559 Vitamin D deficiency, unspecified: Secondary | ICD-10-CM | POA: Diagnosis not present

## 2021-08-06 DIAGNOSIS — F32A Depression, unspecified: Secondary | ICD-10-CM | POA: Diagnosis not present

## 2021-08-06 DIAGNOSIS — G629 Polyneuropathy, unspecified: Secondary | ICD-10-CM | POA: Diagnosis not present

## 2021-08-06 DIAGNOSIS — N390 Urinary tract infection, site not specified: Secondary | ICD-10-CM | POA: Diagnosis not present

## 2021-08-06 DIAGNOSIS — Z9181 History of falling: Secondary | ICD-10-CM | POA: Diagnosis not present

## 2021-08-06 DIAGNOSIS — M5136 Other intervertebral disc degeneration, lumbar region: Secondary | ICD-10-CM | POA: Diagnosis not present

## 2021-08-06 DIAGNOSIS — M4802 Spinal stenosis, cervical region: Secondary | ICD-10-CM | POA: Diagnosis not present

## 2021-08-06 DIAGNOSIS — I10 Essential (primary) hypertension: Secondary | ICD-10-CM | POA: Diagnosis not present

## 2021-08-06 DIAGNOSIS — G43909 Migraine, unspecified, not intractable, without status migrainosus: Secondary | ICD-10-CM | POA: Diagnosis not present

## 2021-08-06 DIAGNOSIS — W19XXXD Unspecified fall, subsequent encounter: Secondary | ICD-10-CM | POA: Diagnosis not present

## 2021-08-06 DIAGNOSIS — G9529 Other cord compression: Secondary | ICD-10-CM | POA: Diagnosis not present

## 2021-08-06 DIAGNOSIS — G9589 Other specified diseases of spinal cord: Secondary | ICD-10-CM | POA: Diagnosis not present

## 2021-08-06 DIAGNOSIS — E538 Deficiency of other specified B group vitamins: Secondary | ICD-10-CM | POA: Diagnosis not present

## 2021-08-06 DIAGNOSIS — I872 Venous insufficiency (chronic) (peripheral): Secondary | ICD-10-CM | POA: Diagnosis not present

## 2021-08-06 DIAGNOSIS — L989 Disorder of the skin and subcutaneous tissue, unspecified: Secondary | ICD-10-CM | POA: Diagnosis not present

## 2021-08-06 DIAGNOSIS — E785 Hyperlipidemia, unspecified: Secondary | ICD-10-CM | POA: Diagnosis not present

## 2021-08-06 MED ORDER — NITROFURANTOIN MONOHYD MACRO 100 MG PO CAPS: 100.0000 mg | ORAL_CAPSULE | Freq: Two times a day (BID) | ORAL | 0 refills | Status: DC

## 2021-08-06 NOTE — Telephone Encounter (Signed)
Prescription sent for Macrobid to treat pt's UTI ?

## 2021-08-06 NOTE — Telephone Encounter (Signed)
Spoke with Valetta Fuller at [facility] and they are going to try and call pt to inform her as I was unable to speak with pt but she did ask that we try again as well/  ?

## 2021-08-06 NOTE — Telephone Encounter (Signed)
Belenda Cruise from [facility] is calling in wanting to know if Dr.[name] has received the urine culture results. Belenda Cruise said it looked like there was E. Coli. Asking if someone is able to gives her a call back.  ?

## 2021-08-07 ENCOUNTER — Other Ambulatory Visit: Payer: Self-pay | Admitting: Family Medicine

## 2021-08-07 DIAGNOSIS — K760 Fatty (change of) liver, not elsewhere classified: Secondary | ICD-10-CM | POA: Diagnosis not present

## 2021-08-07 DIAGNOSIS — E559 Vitamin D deficiency, unspecified: Secondary | ICD-10-CM | POA: Diagnosis not present

## 2021-08-07 DIAGNOSIS — E785 Hyperlipidemia, unspecified: Secondary | ICD-10-CM | POA: Diagnosis not present

## 2021-08-07 DIAGNOSIS — A419 Sepsis, unspecified organism: Secondary | ICD-10-CM | POA: Diagnosis not present

## 2021-08-07 DIAGNOSIS — I872 Venous insufficiency (chronic) (peripheral): Secondary | ICD-10-CM | POA: Diagnosis not present

## 2021-08-07 DIAGNOSIS — N390 Urinary tract infection, site not specified: Secondary | ICD-10-CM | POA: Diagnosis not present

## 2021-08-07 DIAGNOSIS — G9589 Other specified diseases of spinal cord: Secondary | ICD-10-CM | POA: Diagnosis not present

## 2021-08-07 DIAGNOSIS — G629 Polyneuropathy, unspecified: Secondary | ICD-10-CM | POA: Diagnosis not present

## 2021-08-07 DIAGNOSIS — M797 Fibromyalgia: Secondary | ICD-10-CM | POA: Diagnosis not present

## 2021-08-07 DIAGNOSIS — K219 Gastro-esophageal reflux disease without esophagitis: Secondary | ICD-10-CM | POA: Diagnosis not present

## 2021-08-07 DIAGNOSIS — L989 Disorder of the skin and subcutaneous tissue, unspecified: Secondary | ICD-10-CM | POA: Diagnosis not present

## 2021-08-07 DIAGNOSIS — K259 Gastric ulcer, unspecified as acute or chronic, without hemorrhage or perforation: Secondary | ICD-10-CM | POA: Diagnosis not present

## 2021-08-07 DIAGNOSIS — Z9181 History of falling: Secondary | ICD-10-CM | POA: Diagnosis not present

## 2021-08-07 DIAGNOSIS — M300 Polyarteritis nodosa: Secondary | ICD-10-CM | POA: Diagnosis not present

## 2021-08-07 DIAGNOSIS — G9529 Other cord compression: Secondary | ICD-10-CM | POA: Diagnosis not present

## 2021-08-07 DIAGNOSIS — G43909 Migraine, unspecified, not intractable, without status migrainosus: Secondary | ICD-10-CM | POA: Diagnosis not present

## 2021-08-07 DIAGNOSIS — M5136 Other intervertebral disc degeneration, lumbar region: Secondary | ICD-10-CM | POA: Diagnosis not present

## 2021-08-07 DIAGNOSIS — M4802 Spinal stenosis, cervical region: Secondary | ICD-10-CM | POA: Diagnosis not present

## 2021-08-07 DIAGNOSIS — G4733 Obstructive sleep apnea (adult) (pediatric): Secondary | ICD-10-CM | POA: Diagnosis not present

## 2021-08-07 DIAGNOSIS — E538 Deficiency of other specified B group vitamins: Secondary | ICD-10-CM | POA: Diagnosis not present

## 2021-08-07 DIAGNOSIS — F32A Depression, unspecified: Secondary | ICD-10-CM | POA: Diagnosis not present

## 2021-08-07 DIAGNOSIS — R350 Frequency of micturition: Secondary | ICD-10-CM | POA: Diagnosis not present

## 2021-08-07 DIAGNOSIS — W19XXXD Unspecified fall, subsequent encounter: Secondary | ICD-10-CM | POA: Diagnosis not present

## 2021-08-07 DIAGNOSIS — I10 Essential (primary) hypertension: Secondary | ICD-10-CM | POA: Diagnosis not present

## 2021-08-07 DIAGNOSIS — R238 Other skin changes: Secondary | ICD-10-CM | POA: Diagnosis not present

## 2021-08-07 DIAGNOSIS — E871 Hypo-osmolality and hyponatremia: Secondary | ICD-10-CM | POA: Diagnosis not present

## 2021-08-09 ENCOUNTER — Ambulatory Visit: Payer: Self-pay | Admitting: Neurology

## 2021-08-09 DIAGNOSIS — M797 Fibromyalgia: Secondary | ICD-10-CM | POA: Diagnosis not present

## 2021-08-09 DIAGNOSIS — E538 Deficiency of other specified B group vitamins: Secondary | ICD-10-CM | POA: Diagnosis not present

## 2021-08-09 DIAGNOSIS — N390 Urinary tract infection, site not specified: Secondary | ICD-10-CM | POA: Diagnosis not present

## 2021-08-09 DIAGNOSIS — Z9181 History of falling: Secondary | ICD-10-CM | POA: Diagnosis not present

## 2021-08-09 DIAGNOSIS — K219 Gastro-esophageal reflux disease without esophagitis: Secondary | ICD-10-CM | POA: Diagnosis not present

## 2021-08-09 DIAGNOSIS — G629 Polyneuropathy, unspecified: Secondary | ICD-10-CM | POA: Diagnosis not present

## 2021-08-09 DIAGNOSIS — K760 Fatty (change of) liver, not elsewhere classified: Secondary | ICD-10-CM | POA: Diagnosis not present

## 2021-08-09 DIAGNOSIS — I872 Venous insufficiency (chronic) (peripheral): Secondary | ICD-10-CM | POA: Diagnosis not present

## 2021-08-09 DIAGNOSIS — G43909 Migraine, unspecified, not intractable, without status migrainosus: Secondary | ICD-10-CM | POA: Diagnosis not present

## 2021-08-09 DIAGNOSIS — F32A Depression, unspecified: Secondary | ICD-10-CM | POA: Diagnosis not present

## 2021-08-09 DIAGNOSIS — E559 Vitamin D deficiency, unspecified: Secondary | ICD-10-CM | POA: Diagnosis not present

## 2021-08-09 DIAGNOSIS — M5136 Other intervertebral disc degeneration, lumbar region: Secondary | ICD-10-CM | POA: Diagnosis not present

## 2021-08-09 DIAGNOSIS — W19XXXD Unspecified fall, subsequent encounter: Secondary | ICD-10-CM | POA: Diagnosis not present

## 2021-08-09 DIAGNOSIS — E785 Hyperlipidemia, unspecified: Secondary | ICD-10-CM | POA: Diagnosis not present

## 2021-08-09 DIAGNOSIS — G9589 Other specified diseases of spinal cord: Secondary | ICD-10-CM | POA: Diagnosis not present

## 2021-08-09 DIAGNOSIS — I10 Essential (primary) hypertension: Secondary | ICD-10-CM | POA: Diagnosis not present

## 2021-08-09 DIAGNOSIS — R238 Other skin changes: Secondary | ICD-10-CM | POA: Diagnosis not present

## 2021-08-09 DIAGNOSIS — R350 Frequency of micturition: Secondary | ICD-10-CM | POA: Diagnosis not present

## 2021-08-09 DIAGNOSIS — M4802 Spinal stenosis, cervical region: Secondary | ICD-10-CM | POA: Diagnosis not present

## 2021-08-09 DIAGNOSIS — K259 Gastric ulcer, unspecified as acute or chronic, without hemorrhage or perforation: Secondary | ICD-10-CM | POA: Diagnosis not present

## 2021-08-09 DIAGNOSIS — M300 Polyarteritis nodosa: Secondary | ICD-10-CM | POA: Diagnosis not present

## 2021-08-09 DIAGNOSIS — L989 Disorder of the skin and subcutaneous tissue, unspecified: Secondary | ICD-10-CM | POA: Diagnosis not present

## 2021-08-09 DIAGNOSIS — G4733 Obstructive sleep apnea (adult) (pediatric): Secondary | ICD-10-CM | POA: Diagnosis not present

## 2021-08-09 DIAGNOSIS — G9529 Other cord compression: Secondary | ICD-10-CM | POA: Diagnosis not present

## 2021-08-10 ENCOUNTER — Telehealth (INDEPENDENT_AMBULATORY_CARE_PROVIDER_SITE_OTHER): Payer: Medicare Other | Admitting: Family Medicine

## 2021-08-10 ENCOUNTER — Encounter: Payer: Self-pay | Admitting: Family Medicine

## 2021-08-10 DIAGNOSIS — L299 Pruritus, unspecified: Secondary | ICD-10-CM | POA: Diagnosis not present

## 2021-08-10 DIAGNOSIS — G43909 Migraine, unspecified, not intractable, without status migrainosus: Secondary | ICD-10-CM | POA: Diagnosis not present

## 2021-08-10 DIAGNOSIS — Z9181 History of falling: Secondary | ICD-10-CM | POA: Diagnosis not present

## 2021-08-10 DIAGNOSIS — R238 Other skin changes: Secondary | ICD-10-CM | POA: Diagnosis not present

## 2021-08-10 DIAGNOSIS — R350 Frequency of micturition: Secondary | ICD-10-CM | POA: Diagnosis not present

## 2021-08-10 DIAGNOSIS — I872 Venous insufficiency (chronic) (peripheral): Secondary | ICD-10-CM | POA: Diagnosis not present

## 2021-08-10 DIAGNOSIS — G9589 Other specified diseases of spinal cord: Secondary | ICD-10-CM | POA: Diagnosis not present

## 2021-08-10 DIAGNOSIS — F32A Depression, unspecified: Secondary | ICD-10-CM | POA: Diagnosis not present

## 2021-08-10 DIAGNOSIS — W19XXXD Unspecified fall, subsequent encounter: Secondary | ICD-10-CM | POA: Diagnosis not present

## 2021-08-10 DIAGNOSIS — G9529 Other cord compression: Secondary | ICD-10-CM | POA: Diagnosis not present

## 2021-08-10 DIAGNOSIS — M4802 Spinal stenosis, cervical region: Secondary | ICD-10-CM | POA: Diagnosis not present

## 2021-08-10 DIAGNOSIS — E785 Hyperlipidemia, unspecified: Secondary | ICD-10-CM | POA: Diagnosis not present

## 2021-08-10 DIAGNOSIS — G629 Polyneuropathy, unspecified: Secondary | ICD-10-CM | POA: Diagnosis not present

## 2021-08-10 DIAGNOSIS — M5136 Other intervertebral disc degeneration, lumbar region: Secondary | ICD-10-CM | POA: Diagnosis not present

## 2021-08-10 DIAGNOSIS — E538 Deficiency of other specified B group vitamins: Secondary | ICD-10-CM | POA: Diagnosis not present

## 2021-08-10 DIAGNOSIS — L989 Disorder of the skin and subcutaneous tissue, unspecified: Secondary | ICD-10-CM | POA: Diagnosis not present

## 2021-08-10 DIAGNOSIS — G4733 Obstructive sleep apnea (adult) (pediatric): Secondary | ICD-10-CM | POA: Diagnosis not present

## 2021-08-10 DIAGNOSIS — K259 Gastric ulcer, unspecified as acute or chronic, without hemorrhage or perforation: Secondary | ICD-10-CM | POA: Diagnosis not present

## 2021-08-10 DIAGNOSIS — M300 Polyarteritis nodosa: Secondary | ICD-10-CM | POA: Diagnosis not present

## 2021-08-10 DIAGNOSIS — E559 Vitamin D deficiency, unspecified: Secondary | ICD-10-CM | POA: Diagnosis not present

## 2021-08-10 DIAGNOSIS — I10 Essential (primary) hypertension: Secondary | ICD-10-CM | POA: Diagnosis not present

## 2021-08-10 DIAGNOSIS — M797 Fibromyalgia: Secondary | ICD-10-CM | POA: Diagnosis not present

## 2021-08-10 DIAGNOSIS — K219 Gastro-esophageal reflux disease without esophagitis: Secondary | ICD-10-CM | POA: Diagnosis not present

## 2021-08-10 DIAGNOSIS — N390 Urinary tract infection, site not specified: Secondary | ICD-10-CM | POA: Diagnosis not present

## 2021-08-10 DIAGNOSIS — K760 Fatty (change of) liver, not elsewhere classified: Secondary | ICD-10-CM | POA: Diagnosis not present

## 2021-08-10 MED ORDER — PERMETHRIN 5 % EX CREA: 1.0000 "application " | TOPICAL_CREAM | Freq: Once | CUTANEOUS | 1 refills | Status: AC

## 2021-08-10 NOTE — Progress Notes (Signed)
? ?Virtual Visit via Video  ? ?I connected with patient on 08/10/21 at  1:30 PM EDT by a video enabled telemedicine application and verified that I am speaking with the correct person using two identifiers. ? ?Location patient: Home ?Location provider: [facility], Office ?Persons participating in the virtual visit: Patient, Provider, [place] Claiborne Billings C) ? ?I discussed the limitations of evaluation and management by telemedicine and the availability of in person appointments. The patient expressed understanding and agreed to proceed. ? ?Subjective:  ? ?HPI:  ? ?Itching- pt had a visitor to the house that was 'scratching and itching' the whole time she was over.  The next night, pt developed itching.  This was Sunday.  Pt reports back, belly, and arms are most itchy.  Itching is worse at night.  EMT friend came to the house and told pt she appeared to have scabies and she needed to call her doctor for a cream. ? ?ROS:  ? ?See pertinent positives and negatives per HPI. ? ?Patient Active Problem List  ? Diagnosis Date Noted  ? Sacral pressure sore 07/06/2021  ? Vitamin B6 deficiency 06/08/2021  ? Myelomalacia of cervical cord ([place]) 06/06/2021  ? Compressive cervical cord myelomalacia ([place]) 06/05/2021  ? B12 deficiency 06/05/2021  ? Folate deficiency 06/05/2021  ? Vitamin D deficiency 06/05/2021  ? Leg weakness, bilateral 06/04/2021  ? Cervical atypia 01/06/2021  ? Steatosis of liver 01/06/2021  ? Hyponatremia 07/27/2019  ? Urinary urgency 04/04/2018  ? Chronic narcotic use 09/07/2016  ? Acute blood loss anemia 01/14/2016  ? Multiple gastric ulcers   ? PAN (polyarteritis nodosa) ([place]) 11/24/2015  ? GERD (gastroesophageal reflux disease) 01/08/2014  ? Routine general medical examination at a health care facility 04/23/2013  ? Bariatric surgery status 10/31/2012  ? S/P skin biopsy 10/31/2012  ? Psoriasis 01/09/2012  ? DDD (degenerative disc disease), lumbar 11/30/2011  ? Migraine headache   ? [name] (obstructive sleep  apnea) 11/16/2010  ? HYPERGLYCEMIA, FASTING 10/08/2009  ? CONTRACTURE OF TENDON 08/17/2009  ? PARESTHESIA, HANDS 12/23/2008  ? Leg pain, right 11/05/2008  ? ADVERSE DRUG REACTION 04/03/2008  ? PERIPHERAL EDEMA 03/26/2008  ? Obesity, Class III, BMI 40-49.9 (morbid obesity) ([place]) 01/03/2007  ? Cellulitis 01/03/2007  ? Hyperlipidemia 09/22/2006  ? Fibromyalgia 09/20/2006  ? Anxiety and depression 06/28/2006  ? Essential hypertension 06/28/2006  ?  ?Social History  ? ?Tobacco Use  ? Smoking status: Never  ? Smokeless tobacco: Never  ?Substance Use Topics  ? Alcohol use: No  ? ? ?Current Outpatient Medications:  ?  ALPRAZolam (XANAX) 0.5 MG tablet, Take 1 tablet (0.5 mg total) by mouth 2 (two) times daily., Disp: 30 tablet, Rfl: 3 ?  ARIPiprazole (ABILIFY) 10 MG tablet, Take 10 mg by mouth in the morning. , Disp: , Rfl:  ?  Armodafinil 250 MG tablet, Take 250 mg by mouth 3 (three) times daily. 8am, 2pm, 6pm, Disp: , Rfl:  ?  Biotin 10 MG TABS, Take 10 mg by mouth in the morning. , Disp: , Rfl:  ?  buPROPion (WELLBUTRIN XL) 300 MG 24 hr tablet, TAKE 1 TABLET BY MOUTH EVERY DAY, Disp: 30 tablet, Rfl: 2 ?  busPIRone (BUSPAR) 30 MG tablet, TAKE 1 TABLET BY MOUTH 2 TIMES DAILY (Patient taking differently: Take 30 mg by mouth 2 (two) times daily.), Disp: 60 tablet, Rfl: 2 ?  calcium citrate (CALCITRATE - DOSED IN MG ELEMENTAL CALCIUM) 950 MG tablet, Take 1 tablet by mouth daily., Disp: , Rfl:  ?  clonazePAM (KLONOPIN) 1 MG tablet, TAKE 1 TABLET BY MOUTH AT BEDTIME (Patient taking differently: Take 1 mg by mouth daily.), Disp: 30 tablet, Rfl: 3 ?  collagenase (SANTYL) ointment, Apply topically daily. apply Santyl to scabs on legs (Patient taking differently: Apply 1 application. topically daily. apply Santyl to scabs on legs), Disp: 15 g, Rfl: 0 ?  cyclobenzaprine (FLEXERIL) 10 MG tablet, Take 2 tablets (20 mg total) by mouth 2 (two) times daily., Disp: 120 tablet, Rfl: 2 ?  diclofenac Sodium (VOLTAREN) 1 % GEL, Apply 4 g  topically 4 (four) times daily., Disp: , Rfl:  ?  diphenhydrAMINE (BENADRYL) 25 MG tablet, Take 25 mg by mouth every 6 (six) hours as needed for allergies., Disp: , Rfl:  ?  doxycycline (VIBRA-TABS) 100 MG tablet, Take 1 tablet (100 mg total) by mouth 2 (two) times daily., Disp: 14 tablet, Rfl: 0 ?  DULoxetine (CYMBALTA) 60 MG capsule, Take 2 capsules (120 mg total) by mouth daily. (Patient taking differently: Take 60 mg by mouth 2 (two) times daily.), Disp: 240 capsule, Rfl: 2 ?  FEROSUL 325 (65 Fe) MG tablet, TAKE 1 TABLET BY MOUTH EVERY DAY WITH BREAKFAST, Disp: 30 tablet, Rfl: 2 ?  folic acid (FOLVITE) 1 MG tablet, Take 1 tablet (1 mg total) by mouth daily., Disp: 30 tablet, Rfl: 2 ?  furosemide (LASIX) 40 MG tablet, TAKE 1 TABLET BY MOUTH EVERY DAY MAY take additional TABLET IF weight INCREASE by 5lb AND call MD FOR further instructions (Patient taking differently: Take 40 mg by mouth daily. 1t qd-may take an additional tablet if weight increases by 5lbs.), Disp: 30 tablet, Rfl: 1 ?  gabapentin (NEURONTIN) 600 MG tablet, Take 1 tablet (600 mg total) by mouth 3 (three) times daily. TAKE 2 TABLETS BY MOUTH 3 TIMES DAILY (Patient taking differently: Take 600-1,200 mg by mouth 3 (three) times daily. Takes 1 tablet in the morning and afternoon and 2 tablets at night.), Disp: 270 tablet, Rfl: 0 ?  HYDROcodone-acetaminophen (NORCO) 10-325 MG tablet, TAKE 1 TABLET BY MOUTH EVERY 6 HOURS AS NEEDED, Disp: 90 tablet, Rfl: 0 ?  nebivolol (BYSTOLIC) 10 MG tablet, TAKE 1 TABLET BY MOUTH EVERY DAY, Disp: 30 tablet, Rfl: 2 ?  nitrofurantoin, macrocrystal-monohydrate, (MACROBID) 100 MG capsule, Take 1 capsule (100 mg total) by mouth 2 (two) times daily., Disp: 14 capsule, Rfl: 0 ?  nystatin (MYCOSTATIN/NYSTOP) powder, APPLY 1 APPLICATION TOPICALLY 3 TIMES DAILY, Disp: 60 g, Rfl: 3 ?  Oxcarbazepine (TRILEPTAL) 300 MG tablet, Take 300 mg by mouth 2 (two) times daily., Disp: , Rfl:  ?  pantoprazole (PROTONIX) 40 MG tablet, TAKE 1  TABLET BY MOUTH 2 TIMES DAILY, Disp: 60 tablet, Rfl: 2 ?  phentermine 37.5 MG capsule, TAKE 1 CAPSULE BY MOUTH EVERY MORNING (Patient taking differently: Take 37.5 mg by mouth daily.), Disp: 90 capsule, Rfl: 0 ?  promethazine (PHENERGAN) 25 MG tablet, TAKE 1 TABLET BY MOUTH EVERY 6 HOURS AS NEEDED FOR NAUSEA AND vomiting, Disp: 45 tablet, Rfl: 3 ?  pyridOXINE (B-6) 100 MG tablet, Take 1 tablet (100 mg total) by mouth daily., Disp: 30 tablet, Rfl: 2 ?  rosuvastatin (CRESTOR) 20 MG tablet, TAKE 1 TABLET BY MOUTH EVERY DAY (Patient taking differently: Take 20 mg by mouth daily.), Disp: 90 tablet, Rfl: 2 ?  senna-docusate (SENOKOT-S) 8.6-50 MG tablet, Take 1 tablet by mouth at bedtime. (Patient taking differently: Take 1 tablet by mouth daily as needed for mild constipation.), Disp: 30 tablet, Rfl: 0 ?  SKYRIZI 150 MG/ML SOSY, Inject 150 mg into the skin every 3 (three) months., Disp: , Rfl:  ?  SUMAtriptan (IMITREX) 100 MG tablet, TAKE 1 TABLET BY MOUTH AT migraine ONSET, MAY REPEAT in 2 hours IF HEADACHE persists OR recurs (Patient taking differently: Take 100 mg by mouth every 2 (two) hours as needed for migraine.), Disp: 10 tablet, Rfl: 1 ?  thiamine 100 MG tablet, Take 1 tablet (100 mg total) by mouth daily., Disp: 30 tablet, Rfl: 2 ?  vitamin B-12 (CYANOCOBALAMIN) 1000 MCG tablet, Take 1 tablet (1,000 mcg total) by mouth daily., Disp: 30 tablet, Rfl: 2 ? ?Allergies  ?Allergen Reactions  ? Niacin Anaphylaxis, Shortness Of Breath and Swelling  ?  Swelling and "problems breathing"  ? Sulfamethoxazole-Trimethoprim Anaphylaxis, Swelling, Rash and Other (See Comments)  ?  Throat closed and the eyes became swollen  ? Aspirin Hives  ? Bactrim Hives  ? Benzoin Compound Rash  ? Cephalexin Hives  ?  Tolerated ceftriaxone and cefepime 07/2019  ? Clindamycin/Lincomycin Hives  ? Iohexol Hives  ?  Pt treated with PO benedryl  ? Lisinopril Hives  ? Naproxen Hives  ? Pnu-Imune [Pneumococcal Polysaccharide Vaccine] Rash  ?  Sulfonamide Derivatives Rash  ? ? ?Objective:  ? ?There were no vitals taken for this visit. ?AAOx3, NAD ?Obese ?NCAT, EOMI ?No obvious CN deficits ?Pt is able to speak clearly, coherently without shortness of breath

## 2021-08-11 DIAGNOSIS — R262 Difficulty in walking, not elsewhere classified: Secondary | ICD-10-CM | POA: Diagnosis not present

## 2021-08-11 DIAGNOSIS — M6281 Muscle weakness (generalized): Secondary | ICD-10-CM | POA: Diagnosis not present

## 2021-08-11 NOTE — Unmapped (Signed)
spoke to pt, she would like to cancel 3/30 appt with dr Leone Payor. pt rushed off the phone stating she will call back to r/s. Dr Leone Payor is offering 12 pm on 09/09/2021. ae

## 2021-08-12 ENCOUNTER — Other Ambulatory Visit: Payer: Self-pay

## 2021-08-12 ENCOUNTER — Observation Stay (HOSPITAL_COMMUNITY): Payer: Medicare Other

## 2021-08-12 ENCOUNTER — Telehealth (INDEPENDENT_AMBULATORY_CARE_PROVIDER_SITE_OTHER): Payer: Medicare Other | Admitting: Family Medicine

## 2021-08-12 ENCOUNTER — Emergency Department (HOSPITAL_COMMUNITY): Payer: Medicare Other

## 2021-08-12 ENCOUNTER — Observation Stay (HOSPITAL_COMMUNITY)
Admission: EM | Admit: 2021-08-12 | Discharge: 2021-08-15 | Disposition: A | Discharge status: Home / Self Care | Payer: Medicare Other | Attending: Family Medicine | Admitting: Family Medicine

## 2021-08-12 ENCOUNTER — Encounter (HOSPITAL_COMMUNITY): Payer: Self-pay | Admitting: Emergency Medicine

## 2021-08-12 ENCOUNTER — Telehealth: Payer: Self-pay | Admitting: Family Medicine

## 2021-08-12 DIAGNOSIS — J189 Pneumonia, unspecified organism: Secondary | ICD-10-CM | POA: Diagnosis not present

## 2021-08-12 DIAGNOSIS — Z8541 Personal history of malignant neoplasm of cervix uteri: Secondary | ICD-10-CM | POA: Insufficient documentation

## 2021-08-12 DIAGNOSIS — E538 Deficiency of other specified B group vitamins: Secondary | ICD-10-CM | POA: Diagnosis not present

## 2021-08-12 DIAGNOSIS — R10819 Abdominal tenderness, unspecified site: Secondary | ICD-10-CM | POA: Diagnosis not present

## 2021-08-12 DIAGNOSIS — R9389 Abnormal findings on diagnostic imaging of other specified body structures: Secondary | ICD-10-CM | POA: Insufficient documentation

## 2021-08-12 DIAGNOSIS — N390 Urinary tract infection, site not specified: Secondary | ICD-10-CM | POA: Diagnosis not present

## 2021-08-12 DIAGNOSIS — Z792 Long term (current) use of antibiotics: Secondary | ICD-10-CM | POA: Insufficient documentation

## 2021-08-12 DIAGNOSIS — L03116 Cellulitis of left lower limb: Secondary | ICD-10-CM | POA: Insufficient documentation

## 2021-08-12 DIAGNOSIS — J3489 Other specified disorders of nose and nasal sinuses: Secondary | ICD-10-CM | POA: Insufficient documentation

## 2021-08-12 DIAGNOSIS — M797 Fibromyalgia: Secondary | ICD-10-CM | POA: Insufficient documentation

## 2021-08-12 DIAGNOSIS — M300 Polyarteritis nodosa: Secondary | ICD-10-CM | POA: Diagnosis not present

## 2021-08-12 DIAGNOSIS — F32A Depression, unspecified: Secondary | ICD-10-CM | POA: Diagnosis not present

## 2021-08-12 DIAGNOSIS — R600 Localized edema: Secondary | ICD-10-CM | POA: Insufficient documentation

## 2021-08-12 DIAGNOSIS — J069 Acute upper respiratory infection, unspecified: Secondary | ICD-10-CM | POA: Insufficient documentation

## 2021-08-12 DIAGNOSIS — R4781 Slurred speech: Secondary | ICD-10-CM | POA: Insufficient documentation

## 2021-08-12 DIAGNOSIS — R4182 Altered mental status, unspecified: Secondary | ICD-10-CM | POA: Insufficient documentation

## 2021-08-12 DIAGNOSIS — I119 Hypertensive heart disease without heart failure: Secondary | ICD-10-CM | POA: Insufficient documentation

## 2021-08-12 DIAGNOSIS — M6281 Muscle weakness (generalized): Secondary | ICD-10-CM | POA: Insufficient documentation

## 2021-08-12 DIAGNOSIS — G4733 Obstructive sleep apnea (adult) (pediatric): Secondary | ICD-10-CM | POA: Diagnosis not present

## 2021-08-12 DIAGNOSIS — R269 Unspecified abnormalities of gait and mobility: Secondary | ICD-10-CM | POA: Insufficient documentation

## 2021-08-12 DIAGNOSIS — M4802 Spinal stenosis, cervical region: Secondary | ICD-10-CM | POA: Diagnosis not present

## 2021-08-12 DIAGNOSIS — K219 Gastro-esophageal reflux disease without esophagitis: Secondary | ICD-10-CM | POA: Diagnosis not present

## 2021-08-12 DIAGNOSIS — I959 Hypotension, unspecified: Secondary | ICD-10-CM | POA: Diagnosis not present

## 2021-08-12 DIAGNOSIS — L989 Disorder of the skin and subcutaneous tissue, unspecified: Secondary | ICD-10-CM | POA: Diagnosis not present

## 2021-08-12 DIAGNOSIS — M7989 Other specified soft tissue disorders: Secondary | ICD-10-CM | POA: Insufficient documentation

## 2021-08-12 DIAGNOSIS — M5136 Other intervertebral disc degeneration, lumbar region: Secondary | ICD-10-CM | POA: Diagnosis not present

## 2021-08-12 DIAGNOSIS — I878 Other specified disorders of veins: Secondary | ICD-10-CM | POA: Insufficient documentation

## 2021-08-12 DIAGNOSIS — Z9049 Acquired absence of other specified parts of digestive tract: Secondary | ICD-10-CM | POA: Insufficient documentation

## 2021-08-12 DIAGNOSIS — E785 Hyperlipidemia, unspecified: Secondary | ICD-10-CM | POA: Diagnosis not present

## 2021-08-12 DIAGNOSIS — R7401 Elevation of levels of liver transaminase levels: Secondary | ICD-10-CM | POA: Insufficient documentation

## 2021-08-12 DIAGNOSIS — R7989 Other specified abnormal findings of blood chemistry: Secondary | ICD-10-CM | POA: Insufficient documentation

## 2021-08-12 DIAGNOSIS — Z87440 Personal history of urinary (tract) infections: Secondary | ICD-10-CM | POA: Insufficient documentation

## 2021-08-12 DIAGNOSIS — Z79899 Other long term (current) drug therapy: Secondary | ICD-10-CM | POA: Insufficient documentation

## 2021-08-12 DIAGNOSIS — R2681 Unsteadiness on feet: Secondary | ICD-10-CM | POA: Insufficient documentation

## 2021-08-12 DIAGNOSIS — R0689 Other abnormalities of breathing: Secondary | ICD-10-CM | POA: Diagnosis not present

## 2021-08-12 DIAGNOSIS — Z6841 Body Mass Index (BMI) 40.0 and over, adult: Secondary | ICD-10-CM | POA: Insufficient documentation

## 2021-08-12 DIAGNOSIS — R29898 Other symptoms and signs involving the musculoskeletal system: Secondary | ICD-10-CM | POA: Insufficient documentation

## 2021-08-12 DIAGNOSIS — Z743 Need for continuous supervision: Secondary | ICD-10-CM | POA: Diagnosis not present

## 2021-08-12 DIAGNOSIS — R238 Other skin changes: Secondary | ICD-10-CM | POA: Diagnosis not present

## 2021-08-12 DIAGNOSIS — B958 Unspecified staphylococcus as the cause of diseases classified elsewhere: Secondary | ICD-10-CM | POA: Diagnosis not present

## 2021-08-12 DIAGNOSIS — E860 Dehydration: Secondary | ICD-10-CM | POA: Diagnosis not present

## 2021-08-12 DIAGNOSIS — R509 Fever, unspecified: Secondary | ICD-10-CM | POA: Diagnosis not present

## 2021-08-12 DIAGNOSIS — E871 Hypo-osmolality and hyponatremia: Secondary | ICD-10-CM | POA: Diagnosis not present

## 2021-08-12 DIAGNOSIS — I872 Venous insufficiency (chronic) (peripheral): Secondary | ICD-10-CM | POA: Diagnosis not present

## 2021-08-12 DIAGNOSIS — G9589 Other specified diseases of spinal cord: Secondary | ICD-10-CM | POA: Insufficient documentation

## 2021-08-12 DIAGNOSIS — K760 Fatty (change of) liver, not elsewhere classified: Secondary | ICD-10-CM | POA: Diagnosis not present

## 2021-08-12 DIAGNOSIS — B9620 Unspecified Escherichia coli [E. coli] as the cause of diseases classified elsewhere: Secondary | ICD-10-CM | POA: Insufficient documentation

## 2021-08-12 DIAGNOSIS — D649 Anemia, unspecified: Secondary | ICD-10-CM | POA: Insufficient documentation

## 2021-08-12 DIAGNOSIS — M25561 Pain in right knee: Secondary | ICD-10-CM | POA: Insufficient documentation

## 2021-08-12 DIAGNOSIS — R0603 Acute respiratory distress: Secondary | ICD-10-CM | POA: Diagnosis not present

## 2021-08-12 DIAGNOSIS — R350 Frequency of micturition: Secondary | ICD-10-CM | POA: Diagnosis not present

## 2021-08-12 DIAGNOSIS — R945 Abnormal results of liver function studies: Secondary | ICD-10-CM | POA: Diagnosis not present

## 2021-08-12 DIAGNOSIS — B9561 Methicillin susceptible Staphylococcus aureus infection as the cause of diseases classified elsewhere: Secondary | ICD-10-CM | POA: Insufficient documentation

## 2021-08-12 DIAGNOSIS — R7881 Bacteremia: Secondary | ICD-10-CM | POA: Insufficient documentation

## 2021-08-12 DIAGNOSIS — Z881 Allergy status to other antibiotic agents status: Secondary | ICD-10-CM | POA: Insufficient documentation

## 2021-08-12 DIAGNOSIS — Z8709 Personal history of other diseases of the respiratory system: Secondary | ICD-10-CM | POA: Insufficient documentation

## 2021-08-12 DIAGNOSIS — I10 Essential (primary) hypertension: Secondary | ICD-10-CM | POA: Diagnosis not present

## 2021-08-12 DIAGNOSIS — R531 Weakness: Secondary | ICD-10-CM | POA: Diagnosis not present

## 2021-08-12 DIAGNOSIS — M438X4 Other specified deforming dorsopathies, thoracic region: Secondary | ICD-10-CM | POA: Insufficient documentation

## 2021-08-12 DIAGNOSIS — Z9884 Bariatric surgery status: Secondary | ICD-10-CM | POA: Insufficient documentation

## 2021-08-12 DIAGNOSIS — Z20822 Contact with and (suspected) exposure to covid-19: Secondary | ICD-10-CM | POA: Insufficient documentation

## 2021-08-12 DIAGNOSIS — R6889 Other general symptoms and signs: Secondary | ICD-10-CM | POA: Insufficient documentation

## 2021-08-12 DIAGNOSIS — R748 Abnormal levels of other serum enzymes: Secondary | ICD-10-CM | POA: Insufficient documentation

## 2021-08-12 DIAGNOSIS — J90 Pleural effusion, not elsewhere classified: Secondary | ICD-10-CM | POA: Insufficient documentation

## 2021-08-12 DIAGNOSIS — M4854XD Collapsed vertebra, not elsewhere classified, thoracic region, subsequent encounter for fracture with routine healing: Secondary | ICD-10-CM | POA: Insufficient documentation

## 2021-08-12 DIAGNOSIS — K259 Gastric ulcer, unspecified as acute or chronic, without hemorrhage or perforation: Secondary | ICD-10-CM | POA: Diagnosis not present

## 2021-08-12 DIAGNOSIS — E559 Vitamin D deficiency, unspecified: Secondary | ICD-10-CM | POA: Diagnosis not present

## 2021-08-12 DIAGNOSIS — G43909 Migraine, unspecified, not intractable, without status migrainosus: Secondary | ICD-10-CM | POA: Diagnosis not present

## 2021-08-12 DIAGNOSIS — F419 Anxiety disorder, unspecified: Secondary | ICD-10-CM | POA: Insufficient documentation

## 2021-08-12 DIAGNOSIS — G9529 Other cord compression: Secondary | ICD-10-CM | POA: Diagnosis not present

## 2021-08-12 DIAGNOSIS — R2689 Other abnormalities of gait and mobility: Secondary | ICD-10-CM | POA: Insufficient documentation

## 2021-08-12 DIAGNOSIS — K838 Other specified diseases of biliary tract: Secondary | ICD-10-CM | POA: Insufficient documentation

## 2021-08-12 DIAGNOSIS — G629 Polyneuropathy, unspecified: Secondary | ICD-10-CM | POA: Diagnosis not present

## 2021-08-12 DIAGNOSIS — L03115 Cellulitis of right lower limb: Secondary | ICD-10-CM | POA: Insufficient documentation

## 2021-08-12 DIAGNOSIS — I517 Cardiomegaly: Secondary | ICD-10-CM | POA: Diagnosis not present

## 2021-08-12 LAB — CBC WITH DIFFERENTIAL/PLATELET
Abs Immature Granulocytes: 0.02 10*3/uL (ref 0.00–0.07)
Basophils Absolute: 0 10*3/uL (ref 0.0–0.1)
Basophils Relative: 0 %
Eosinophils Absolute: 0.2 10*3/uL (ref 0.0–0.5)
Eosinophils Relative: 2 %
HCT: 33.8 % — ABNORMAL LOW (ref 36.0–46.0)
Hemoglobin: 11.3 g/dL — ABNORMAL LOW (ref 12.0–15.0)
Immature Granulocytes: 0 %
Lymphocytes Relative: 4 %
Lymphs Abs: 0.3 10*3/uL — ABNORMAL LOW (ref 0.7–4.0)
MCH: 29.5 pg (ref 26.0–34.0)
MCHC: 33.4 g/dL (ref 30.0–36.0)
MCV: 88.3 fL (ref 80.0–100.0)
Monocytes Absolute: 0.4 10*3/uL (ref 0.1–1.0)
Monocytes Relative: 5 %
Neutro Abs: 6 10*3/uL (ref 1.7–7.7)
Neutrophils Relative %: 89 %
Platelets: 164 10*3/uL (ref 150–400)
RBC: 3.83 MIL/uL — ABNORMAL LOW (ref 3.87–5.11)
RDW: 15.9 % — ABNORMAL HIGH (ref 11.5–15.5)
WBC: 6.8 10*3/uL (ref 4.0–10.5)
nRBC: 0 % (ref 0.0–0.2)

## 2021-08-12 LAB — COMPREHENSIVE METABOLIC PANEL
ALT: 88 U/L — ABNORMAL HIGH (ref 0–44)
AST: 110 U/L — ABNORMAL HIGH (ref 15–41)
Albumin: 3.1 g/dL — ABNORMAL LOW (ref 3.5–5.0)
Alkaline Phosphatase: 174 U/L — ABNORMAL HIGH (ref 38–126)
Anion gap: 9 (ref 5–15)
BUN: 23 mg/dL — ABNORMAL HIGH (ref 6–20)
CO2: 23 mmol/L (ref 22–32)
Calcium: 7.9 mg/dL — ABNORMAL LOW (ref 8.9–10.3)
Chloride: 94 mmol/L — ABNORMAL LOW (ref 98–111)
Creatinine, Ser: 0.57 mg/dL (ref 0.44–1.00)
GFR, Estimated: >60 mL/min (ref >60)
Glucose, Bld: 94 mg/dL (ref 70–99)
Potassium: 3.4 mmol/L — ABNORMAL LOW (ref 3.5–5.1)
Sodium: 126 mmol/L — ABNORMAL LOW (ref 135–145)
Total Bilirubin: 2.5 mg/dL — ABNORMAL HIGH (ref 0.3–1.2)
Total Protein: 6.4 g/dL — ABNORMAL LOW (ref 6.5–8.1)

## 2021-08-12 LAB — LACTIC ACID, PLASMA: Lactic Acid, Venous: 1.1 mmol/L (ref 0.5–1.9)

## 2021-08-12 LAB — BILIRUBIN, FRACTIONATED(TOT/DIR/INDIR)
Bilirubin, Direct: 1.7 mg/dL — ABNORMAL HIGH (ref 0.0–0.2)
Indirect Bilirubin: 0.9 mg/dL (ref 0.3–0.9)
Total Bilirubin: 2.6 mg/dL — ABNORMAL HIGH (ref 0.3–1.2)

## 2021-08-12 LAB — RESP PANEL BY RT-PCR (FLU A&B, COVID) ARPGX2
Influenza A by PCR: NEGATIVE
Influenza B by PCR: NEGATIVE
SARS Coronavirus 2 by RT PCR: NEGATIVE

## 2021-08-12 LAB — URINALYSIS, ROUTINE W REFLEX MICROSCOPIC
Bilirubin Urine: NEGATIVE
Glucose, UA: NEGATIVE mg/dL
Ketones, ur: NEGATIVE mg/dL
Nitrite: NEGATIVE
Protein, ur: NEGATIVE mg/dL
Specific Gravity, Urine: 1.005 (ref 1.005–1.030)
pH: 5 (ref 5.0–8.0)

## 2021-08-12 LAB — PROTIME-INR
INR: 1.4 — ABNORMAL HIGH (ref 0.8–1.2)
Prothrombin Time: 17.2 seconds — ABNORMAL HIGH (ref 11.4–15.2)

## 2021-08-12 LAB — HEPATITIS PANEL, ACUTE
HCV Ab: NONREACTIVE
Hep A IgM: NONREACTIVE
Hep B C IgM: NONREACTIVE
Hepatitis B Surface Ag: NONREACTIVE

## 2021-08-12 LAB — BRAIN NATRIURETIC PEPTIDE: B Natriuretic Peptide: 106.2 pg/mL — ABNORMAL HIGH (ref 0.0–100.0)

## 2021-08-12 LAB — SODIUM, URINE, RANDOM: Sodium, Ur: 16 mmol/L

## 2021-08-12 LAB — PROCALCITONIN: Procalcitonin: 2.26 ng/mL

## 2021-08-12 LAB — OSMOLALITY: Osmolality: 271 mOsm/kg — ABNORMAL LOW (ref 275–295)

## 2021-08-12 LAB — APTT: aPTT: 37 seconds — ABNORMAL HIGH (ref 24–36)

## 2021-08-12 LAB — OSMOLALITY, URINE: Osmolality, Ur: 184 mOsm/kg — ABNORMAL LOW (ref 300–900)

## 2021-08-12 LAB — GAMMA GT: GGT: 242 U/L — ABNORMAL HIGH (ref 7–50)

## 2021-08-12 MED ORDER — LACTATED RINGERS IV SOLN: INTRAVENOUS | Status: DC

## 2021-08-12 MED ORDER — SODIUM CHLORIDE 0.9 % IV SOLN: 1.0000 g | INTRAVENOUS | Status: DC

## 2021-08-12 MED ORDER — ENOXAPARIN SODIUM 40 MG/0.4ML IJ SOSY
40.0000 mg | PREFILLED_SYRINGE | INTRAMUSCULAR | Status: DC
  Administered 2021-08-12 – 2021-08-14 (×3): 40 mg via SUBCUTANEOUS
  Filled 2021-08-12 (×3): qty 0.4

## 2021-08-12 MED ORDER — CALCIUM CARBONATE ANTACID 500 MG PO CHEW
1.0000 | CHEWABLE_TABLET | Freq: Two times a day (BID) | ORAL | Status: DC
  Administered 2021-08-13 – 2021-08-15 (×5): 200 mg via ORAL
  Filled 2021-08-12 (×5): qty 1

## 2021-08-12 MED ORDER — ACETAMINOPHEN 325 MG PO TABS
650.0000 mg | ORAL_TABLET | Freq: Four times a day (QID) | ORAL | Status: DC | PRN
  Administered 2021-08-13 – 2021-08-15 (×4): 650 mg via ORAL
  Filled 2021-08-12 (×4): qty 2

## 2021-08-12 MED ORDER — ACETAMINOPHEN 650 MG RE SUPP: 650.0000 mg | Freq: Four times a day (QID) | RECTAL | Status: DC | PRN

## 2021-08-12 MED ORDER — LEVOFLOXACIN IN D5W 750 MG/150ML IV SOLN
750.0000 mg | Freq: Once | INTRAVENOUS | Status: AC
  Administered 2021-08-12: 750 mg via INTRAVENOUS
  Filled 2021-08-12: qty 150

## 2021-08-12 MED ORDER — LACTATED RINGERS IV BOLUS (SEPSIS)
1000.0000 mL | Freq: Once | INTRAVENOUS | Status: AC
  Administered 2021-08-12: 1000 mL via INTRAVENOUS

## 2021-08-12 NOTE — ED Provider Notes (Signed)
?[facility] DEPT ?Provider Note ? ? ?CSN: [id number] ?Arrival date & time: 08/12/21  1109 ? ?  ? ?History ? ?Chief Complaint  ?Patient presents with  ? Fever  ? ? ?[name] is a 51 y.o. female. ? ?HPI ? ?  ? ?51 year old female comes in with chief complaint of fever, low blood pressure. ?Patient has history of obesity status post bariatric surgery, depression, hypertension, hyperlipidemia.  She was admitted to the hospital in January and found to have compressive myelomalacia of the C-spine, following up with neurosurgery as an outpatient along with UTI. ? ?Patient indicates that her home nurse had seen her today and advised her to come to the ER. ? ?Patient has been having fevers over the last 2 days, Tmax of 102.  Last night she had temp of 99.8.  When EMS was called, her BP was low and they gave her finder cc of fluid bolus. ? ?Patient states that she is currently on Macrobid for UTI.  It was started because she was having symptoms consistent with her prior UTI, that mainly includes foul-smelling urine.  She is not having any burning with urination, back pain, nausea, vomiting, fevers, chills, chest pain, cough, shortness of breath.  She does not have any history of CAD or CHF.  ? ?Pt has no hx of PE, DVT and denies any exogenous hormone (testosterone / estrogen) use, long distance travels or surgery in the past 6 weeks, active cancer, recent immobilization. ? ? ?Home Medications ?Prior to Admission medications   ?Medication Sig Start Date End Date Taking? Authorizing Provider  ?ALPRAZolam (XANAX) 0.5 MG tablet Take 1 tablet (0.5 mg total) by mouth 2 (two) times daily. 06/22/21   [name], MD  ?ARIPiprazole (ABILIFY) 10 MG tablet Take 10 mg by mouth in the morning.     [provider]  ?Armodafinil 250 MG tablet Take 250 mg by mouth 3 (three) times daily. 8am, 2pm, 6pm 11/12/19   [provider]  ?Biotin 10 MG TABS Take 10 mg by mouth in the morning.   07/25/18   [provider]  ?buPROPion (WELLBUTRIN XL) 300 MG 24 hr tablet TAKE 1 TABLET BY MOUTH EVERY DAY 08/02/21   [name], MD  ?busPIRone (BUSPAR) 30 MG tablet TAKE 1 TABLET BY MOUTH 2 TIMES DAILY ?Patient taking differently: Take 30 mg by mouth 2 (two) times daily. 05/19/21   [name], MD  ?calcium citrate (CALCITRATE - DOSED IN MG ELEMENTAL CALCIUM) 950 MG tablet Take 1 tablet by mouth daily.    [provider]  ?clonazePAM (KLONOPIN) 1 MG tablet TAKE 1 TABLET BY MOUTH AT BEDTIME ?Patient taking differently: Take 1 mg by mouth daily. 05/20/21   [name], MD  ?collagenase (SANTYL) ointment Apply topically daily. apply Santyl to scabs on legs ?Patient taking differently: Apply 1 application. topically daily. apply Santyl to scabs on legs 08/03/19   Florencia Reasons, MD  ?cyclobenzaprine (FLEXERIL) 10 MG tablet Take 2 tablets (20 mg total) by mouth 2 (two) times daily. 08/02/21   [name], MD  ?diclofenac Sodium (VOLTAREN) 1 % GEL Apply 4 g topically 4 (four) times daily. 02/11/21   [provider]  ?diphenhydrAMINE (BENADRYL) 25 MG tablet Take 25 mg by mouth every 6 (six) hours as needed for allergies.    [provider]  ?doxycycline (VIBRA-TABS) 100 MG tablet Take 1 tablet (100 mg total) by mouth 2 (two) times daily. 07/28/21   Birdie Riddle,  Aundra Millet, MD  ?DULoxetine (CYMBALTA) 60 MG capsule Take 2 capsules (120 mg total) by mouth daily. ?Patient taking differently: Take 60 mg by mouth 2 (two) times daily. 06/22/15   [name], MD  ?FEROSUL 325 (65 Fe) MG tablet TAKE 1 TABLET BY MOUTH EVERY DAY WITH BREAKFAST 08/02/21   [name], MD  ?folic acid (FOLVITE) 1 MG tablet Take 1 tablet (1 mg total) by mouth daily. 06/10/21 09/08/21  Terrilee Croak, MD  ?furosemide (LASIX) 40 MG tablet TAKE 1 TABLET BY MOUTH EVERY DAY MAY take additional TABLET IF weight INCREASE by 5lb AND call MD FOR further instructions ?Patient taking differently: Take  40 mg by mouth daily. 1t qd-may take an additional tablet if weight increases by 5lbs. 05/19/21   [name], MD  ?gabapentin (NEURONTIN) 600 MG tablet Take 1 tablet (600 mg total) by mouth 3 (three) times daily. TAKE 2 TABLETS BY MOUTH 3 TIMES DAILY ?Patient taking differently: Take 600-1,200 mg by mouth 3 (three) times daily. Takes 1 tablet in the morning and afternoon and 2 tablets at night. 07/09/20   [name], MD  ?HYDROcodone-acetaminophen (NORCO) 10-325 MG tablet TAKE 1 TABLET BY MOUTH EVERY 6 HOURS AS NEEDED 08/09/21   [name], MD  ?nebivolol (BYSTOLIC) 10 MG tablet TAKE 1 TABLET BY MOUTH EVERY DAY 08/02/21   [name], MD  ?nitrofurantoin, macrocrystal-monohydrate, (MACROBID) 100 MG capsule Take 1 capsule (100 mg total) by mouth 2 (two) times daily. 08/06/21   [name], MD  ?nystatin (MYCOSTATIN/NYSTOP) powder APPLY 1 APPLICATION TOPICALLY 3 TIMES DAILY 07/14/21   [name], MD  ?Oxcarbazepine (TRILEPTAL) 300 MG tablet Take 300 mg by mouth 2 (two) times daily.    [provider]  ?pantoprazole (PROTONIX) 40 MG tablet TAKE 1 TABLET BY MOUTH 2 TIMES DAILY 08/02/21   [name], MD  ?phentermine 37.5 MG capsule TAKE 1 CAPSULE BY MOUTH EVERY MORNING ?Patient taking differently: Take 37.5 mg by mouth daily. 05/19/21   [name], MD  ?promethazine (PHENERGAN) 25 MG tablet TAKE 1 TABLET BY MOUTH EVERY 6 HOURS AS NEEDED FOR NAUSEA AND vomiting 06/10/21   [name], MD  ?pyridOXINE (B-6) 100 MG tablet Take 1 tablet (100 mg total) by mouth daily. 06/10/21 09/08/21  Terrilee Croak, MD  ?rosuvastatin (CRESTOR) 20 MG tablet TAKE 1 TABLET BY MOUTH EVERY DAY ?Patient taking differently: Take 20 mg by mouth daily. 08/18/20   [name], MD  ?senna-docusate (SENOKOT-S) 8.6-50 MG tablet Take 1 tablet by mouth at bedtime. ?Patient taking differently: Take 1 tablet by mouth daily as needed for mild constipation. 08/02/19   Florencia Reasons, MD   ?SKYRIZI 150 MG/ML SOSY Inject 150 mg into the skin every 3 (three) months. 02/15/21   [provider]  ?SUMAtriptan (IMITREX) 100 MG tablet TAKE 1 TABLET BY MOUTH AT migraine ONSET, MAY REPEAT in 2 hours IF HEADACHE persists OR recurs ?Patient taking differently: Take 100 mg by mouth every 2 (two) hours as needed for migraine. 01/19/21   Kennyth Arnold, FNP  ?thiamine 100 MG tablet Take 1 tablet (100 mg total) by mouth daily. 06/10/21 09/08/21  Terrilee Croak, MD  ?vitamin B-12 (CYANOCOBALAMIN) 1000 MCG tablet Take 1 tablet (1,000 mcg total) by mouth daily. 06/09/21 09/07/21  Terrilee Croak, MD  ?   ? ?Allergies    ?Niacin, Sulfamethoxazole-trimethoprim, Aspirin, Bactrim, Benzoin compound, Cephalexin, Clindamycin/lincomycin, Iohexol, Lisinopril, Naproxen, Pnu-imune [pneumococcal polysaccharide vaccine], and Sulfonamide  derivatives   ? ?Review of Systems   ?Review of Systems  ?All other systems reviewed and are negative. ? ?Physical Exam ?Updated Vital Signs ?BP (!) 151/102   Pulse 77   Temp 98.7 ?F (37.1 ?C) (Oral)   Resp 20   SpO2 93%  ?Physical Exam ?Vitals and nursing note reviewed.  ?Constitutional:   ?   Appearance: She is well-developed.  ?HENT:  ?   Head: Atraumatic.  ?Eyes:  ?   Extraocular Movements: Extraocular movements intact.  ?   Pupils: Pupils are equal, round, and reactive to light.  ?Cardiovascular:  ?   Rate and Rhythm: Normal rate.  ?Pulmonary:  ?   Effort: Pulmonary effort is normal.  ?Abdominal:  ?   Tenderness: There is abdominal tenderness.  ?Musculoskeletal:     ?   General: No tenderness.  ?   Cervical back: Normal range of motion and neck supple.  ?   Right lower leg: Edema present.  ?   Left lower leg: Edema present.  ?   Comments: No focal swelling or tenderness  ?Skin: ?   General: Skin is warm and dry.  ?Neurological:  ?   General: No focal deficit present.  ?   Mental Status: She is alert and oriented to person, place, and time.  ?   Cranial Nerves: No cranial nerve deficit.  ?    Sensory: No sensory deficit.  ?   Motor: No weakness.  ?   Coordination: Coordination normal.  ? ? ?ED Results / Procedures / Treatments   ?Labs ?(all labs ordered are listed, but only abnormal results are

## 2021-08-12 NOTE — H&P (Signed)
?History and Physical  ? ? ?Patient: [name] NLG:[id number] DOB: [birth year] ?DOA: 08/12/2021 ?DOS: the patient was seen and examined on 08/12/2021 ?PCP: [name], MD  ?Patient coming from: Home ? ?Chief Complaint:  ?Chief Complaint  ?Patient presents with  ? Fever  ? ?HPI: [name] is a 51 y.o. female with medical history significant of depression/anxiety, HLD, HTN. Presenting with fevers. She reports that she had a fever of 102 yesterday. Her [place] came out to see her this morning and found that her BP was low, so she called for EMS. The patient denies any symptoms other than some itching all over her body for which she was diagnosed with scabies two days ago. She was given permethrin and that symptom has resolved. She reports over the last couple of weeks, she has been on abx for recurrent BLE cellulitis (doxy x 7 days) and recurrent UTI (macrobid; prescribed 08/06/21). She denies any new sores, ulcers, erythema. She denies any new cough. She denies diarrhea. She denies sick contacts.  She denies any other aggravating or alleviating factors.  ? ?Review of Systems: As mentioned in the history of present illness. All other systems reviewed and are negative. ?Past Medical History:  ?Diagnosis Date  ? Allergic rhinitis   ? Ankle fracture, right   ? Anxiety   ? ASCUS of cervix with negative high risk HPV 05/2018  ? Carpal tunnel syndrome   ? Cervical cancer ([place]) 1998  ? Fibromyalgia   ? Hyperlipidemia   ? Hypertension   ? Migraine headache   ? Obesity   ? Sleep apnea   ? ?Past Surgical History:  ?Procedure Laterality Date  ? ABDOMINAL HYSTERECTOMY  10/1996  ? ABDOMINAL SURGERY  jan/11/2012  ? gastric bypass surgery  ? CARPAL TUNNEL RELEASE    ? bilateral   ? CESAREAN SECTION    ? CHOLECYSTECTOMY  07/1992  ? ESOPHAGOGASTRODUODENOSCOPY N/A 01/14/2016  ? Procedure: ESOPHAGOGASTRODUODENOSCOPY (EGD);  Surgeon: Mauri Pole, MD;  Location: [facility] ENDOSCOPY;  Service: Endoscopy;  Laterality: N/A;  ? ?Social  History:  reports that she has never smoked. She has never used smokeless tobacco. She reports that she does not drink alcohol and does not use drugs. ? ?Allergies  ?Allergen Reactions  ? Niacin Anaphylaxis, Shortness Of Breath and Swelling  ?  Swelling and "problems breathing"  ? Sulfamethoxazole-Trimethoprim Anaphylaxis, Swelling, Rash and Other (See Comments)  ?  Throat closed and the eyes became swollen  ? Aspirin Hives  ? Bactrim Hives  ? Benzoin Compound Rash  ? Cephalexin Hives  ?  Tolerated ceftriaxone and cefepime 07/2019  ? Clindamycin/Lincomycin Hives  ? Iohexol Hives  ?  Pt treated with PO benedryl  ? Lisinopril Hives  ? Naproxen Hives  ? Pnu-Imune [Pneumococcal Polysaccharide Vaccine] Rash  ? Sulfonamide Derivatives Rash  ? ? ?Family History  ?Problem Relation Age of Onset  ? Diabetes Mother   ? Emphysema Mother   ? Allergies Mother   ? Hypertension Mother   ? Heart disease Father   ? Allergies Father   ? Prostate cancer Father   ? Breast cancer Maternal Grandmother 71  ? ? ?Prior to Admission medications   ?Medication Sig Start Date End Date Taking? Authorizing Provider  ?ALPRAZolam (XANAX) 0.5 MG tablet Take 1 tablet (0.5 mg total) by mouth 2 (two) times daily. 06/22/21   [name], MD  ?ARIPiprazole (ABILIFY) 10 MG tablet Take 10 mg by mouth in the morning.  [provider]  ?Armodafinil 250 MG tablet Take 250 mg by mouth 3 (three) times daily. 8am, 2pm, 6pm 11/12/19   [provider]  ?Biotin 10 MG TABS Take 10 mg by mouth in the morning.  07/25/18   [provider]  ?buPROPion (WELLBUTRIN XL) 300 MG 24 hr tablet TAKE 1 TABLET BY MOUTH EVERY DAY 08/02/21   [name], MD  ?busPIRone (BUSPAR) 30 MG tablet TAKE 1 TABLET BY MOUTH 2 TIMES DAILY ?Patient taking differently: Take 30 mg by mouth 2 (two) times daily. 05/19/21   [name], MD  ?calcium citrate (CALCITRATE - DOSED IN MG ELEMENTAL CALCIUM) 950 MG tablet Take 1 tablet by mouth daily.     [provider]  ?clonazePAM (KLONOPIN) 1 MG tablet TAKE 1 TABLET BY MOUTH AT BEDTIME ?Patient taking differently: Take 1 mg by mouth daily. 05/20/21   [name], MD  ?collagenase (SANTYL) ointment Apply topically daily. apply Santyl to scabs on legs ?Patient taking differently: Apply 1 application. topically daily. apply Santyl to scabs on legs 08/03/19   Florencia Reasons, MD  ?cyclobenzaprine (FLEXERIL) 10 MG tablet Take 2 tablets (20 mg total) by mouth 2 (two) times daily. 08/02/21   [name], MD  ?diclofenac Sodium (VOLTAREN) 1 % GEL Apply 4 g topically 4 (four) times daily. 02/11/21   [provider]  ?diphenhydrAMINE (BENADRYL) 25 MG tablet Take 25 mg by mouth every 6 (six) hours as needed for allergies.    [provider]  ?doxycycline (VIBRA-TABS) 100 MG tablet Take 1 tablet (100 mg total) by mouth 2 (two) times daily. 07/28/21   [name], MD  ?DULoxetine (CYMBALTA) 60 MG capsule Take 2 capsules (120 mg total) by mouth daily. ?Patient taking differently: Take 60 mg by mouth 2 (two) times daily. 06/22/15   [name], MD  ?FEROSUL 325 (65 Fe) MG tablet TAKE 1 TABLET BY MOUTH EVERY DAY WITH BREAKFAST 08/02/21   [name], MD  ?folic acid (FOLVITE) 1 MG tablet Take 1 tablet (1 mg total) by mouth daily. 06/10/21 09/08/21  Terrilee Croak, MD  ?furosemide (LASIX) 40 MG tablet TAKE 1 TABLET BY MOUTH EVERY DAY MAY take additional TABLET IF weight INCREASE by 5lb AND call MD FOR further instructions ?Patient taking differently: Take 40 mg by mouth daily. 1t qd-may take an additional tablet if weight increases by 5lbs. 05/19/21   [name], MD  ?gabapentin (NEURONTIN) 600 MG tablet Take 1 tablet (600 mg total) by mouth 3 (three) times daily. TAKE 2 TABLETS BY MOUTH 3 TIMES DAILY ?Patient taking differently: Take 600-1,200 mg by mouth 3 (three) times daily. Takes 1 tablet in the morning and afternoon and 2 tablets at night. 07/09/20   [name],  MD  ?HYDROcodone-acetaminophen (NORCO) 10-325 MG tablet TAKE 1 TABLET BY MOUTH EVERY 6 HOURS AS NEEDED 08/09/21   [name], MD  ?nebivolol (BYSTOLIC) 10 MG tablet TAKE 1 TABLET BY MOUTH EVERY DAY 08/02/21   [name], MD  ?nitrofurantoin, macrocrystal-monohydrate, (MACROBID) 100 MG capsule Take 1 capsule (100 mg total) by mouth 2 (two) times daily. 08/06/21   [name], MD  ?nystatin (MYCOSTATIN/NYSTOP) powder APPLY 1 APPLICATION TOPICALLY 3 TIMES DAILY 07/14/21   [name], MD  ?Oxcarbazepine (TRILEPTAL) 300 MG tablet Take 300 mg by mouth 2 (two) times daily.    [provider]  ?pantoprazole (PROTONIX) 40 MG tablet TAKE 1 TABLET BY MOUTH 2 TIMES  DAILY 08/02/21   [name], MD  ?phentermine 37.5 MG capsule TAKE 1 CAPSULE BY MOUTH EVERY MORNING ?Patient taking differently: Take 37.5 mg by mouth daily. 05/19/21   [name], MD  ?promethazine (PHENERGAN) 25 MG tablet TAKE 1 TABLET BY MOUTH EVERY 6 HOURS AS NEEDED FOR NAUSEA AND vomiting 06/10/21   [name], MD  ?pyridOXINE (B-6) 100 MG tablet Take 1 tablet (100 mg total) by mouth daily. 06/10/21 09/08/21  Terrilee Croak, MD  ?rosuvastatin (CRESTOR) 20 MG tablet TAKE 1 TABLET BY MOUTH EVERY DAY ?Patient taking differently: Take 20 mg by mouth daily. 08/18/20   [name], MD  ?senna-docusate (SENOKOT-S) 8.6-50 MG tablet Take 1 tablet by mouth at bedtime. ?Patient taking differently: Take 1 tablet by mouth daily as needed for mild constipation. 08/02/19   Florencia Reasons, MD  ?SKYRIZI 150 MG/ML SOSY Inject 150 mg into the skin every 3 (three) months. 02/15/21   [provider]  ?SUMAtriptan (IMITREX) 100 MG tablet TAKE 1 TABLET BY MOUTH AT migraine ONSET, MAY REPEAT in 2 hours IF HEADACHE persists OR recurs ?Patient taking differently: Take 100 mg by mouth every 2 (two) hours as needed for migraine. 01/19/21   Kennyth Arnold, FNP  ?thiamine 100 MG tablet Take 1 tablet (100 mg total) by mouth  daily. 06/10/21 09/08/21  Terrilee Croak, MD  ?vitamin B-12 (CYANOCOBALAMIN) 1000 MCG tablet Take 1 tablet (1,000 mcg total) by mouth daily. 06/09/21 09/07/21  Terrilee Croak, MD  ? ? ?Physical Exam: ?Vitals:  ? 03/3

## 2021-08-12 NOTE — Telephone Encounter (Addendum)
Noted.  Patient has been admitted.  To Dr. Birdie Riddle as Juluis Rainier. ?

## 2021-08-12 NOTE — Telephone Encounter (Signed)
Nurse with [facility] called with an update and wanted [name] to know that she called EMS this morning at her visit and she is being taken to [facility]. Patient's BP was low, slurring speech. She just wanted someone to be aware ?

## 2021-08-12 NOTE — Progress Notes (Signed)
Pharmacy Antibiotic Note ? ?[name] is a 51 y.o. female admitted on 08/12/2021 with UTI.  Pharmacy has been consulted for Levaquin dosing due to history of Cephalexin allergy, however, she has previously tolerated both Cefepime and Ceftriaxone. ? ?Plan: ?Levaquin x1 dose in ED already given.  ?Ceftriaxone 1g IV q24h  ?Dosage remains stable and need for further dosage adjustment appears unlikely at present.  Pharmacy will sign off at this time.  Please reconsult if a change in clinical status warrants re-evaluation of dosage. ? ? ? ?  ? ?Temp (24hrs), Avg:98.7 ?F (37.1 ?C), Min:98.7 ?F (37.1 ?C), Max:98.7 ?F (37.1 ?C) ? ?Recent Labs  ?Lab 08/12/21 ?1220  ?WBC 6.8  ?  ?CrCl cannot be calculated (Patient's most recent lab result is older than the maximum 21 days allowed.).   ? ?Allergies  ?Allergen Reactions  ? Niacin Anaphylaxis, Shortness Of Breath and Swelling  ?  Swelling and "problems breathing"  ? Sulfamethoxazole-Trimethoprim Anaphylaxis, Swelling, Rash and Other (See Comments)  ?  Throat closed and the eyes became swollen  ? Aspirin Hives  ? Bactrim Hives  ? Benzoin Compound Rash  ? Cephalexin Hives  ?  Tolerated ceftriaxone and cefepime 07/2019  ? Clindamycin/Lincomycin Hives  ? Iohexol Hives  ?  Pt treated with PO benedryl  ? Lisinopril Hives  ? Naproxen Hives  ? Pnu-Imune [Pneumococcal Polysaccharide Vaccine] Rash  ? Sulfonamide Derivatives Rash  ? ? ?Antimicrobials this admission: ?3/30 Levaquin x1 ?3/31 Ceftriaxone >>  ? ?Dose adjustments this admission: ? ? ?Microbiology results: ?3/30 BCx:  ?3/30 UCx:  ? ?Thank you for allowing pharmacy to be a part of this patient?s care. ? ?Gretta Arab PharmD, BCPS ?Clinical Pharmacist ?[facility] main [facility] [phone] ?08/12/2021 12:38 PM ? ? ?

## 2021-08-12 NOTE — ED Triage Notes (Signed)
Patient here from home reporting fever 2 days. '1000mg'$  Tylenlol given in route, 553m fluids. States that she is being treated for ecoli.  ?

## 2021-08-13 DIAGNOSIS — F32A Depression, unspecified: Secondary | ICD-10-CM | POA: Diagnosis not present

## 2021-08-13 DIAGNOSIS — F419 Anxiety disorder, unspecified: Secondary | ICD-10-CM | POA: Diagnosis not present

## 2021-08-13 DIAGNOSIS — R7881 Bacteremia: Secondary | ICD-10-CM | POA: Diagnosis not present

## 2021-08-13 DIAGNOSIS — R509 Fever, unspecified: Secondary | ICD-10-CM | POA: Diagnosis not present

## 2021-08-13 DIAGNOSIS — E871 Hypo-osmolality and hyponatremia: Secondary | ICD-10-CM

## 2021-08-13 LAB — RESPIRATORY PANEL BY PCR

## 2021-08-13 LAB — BLOOD CULTURE ID PANEL (REFLEXED) - BCID2

## 2021-08-13 LAB — COMPREHENSIVE METABOLIC PANEL
ALT: 69 U/L — ABNORMAL HIGH (ref 0–44)
AST: 67 U/L — ABNORMAL HIGH (ref 15–41)
Albumin: 3 g/dL — ABNORMAL LOW (ref 3.5–5.0)
Alkaline Phosphatase: 182 U/L — ABNORMAL HIGH (ref 38–126)
Anion gap: 7 (ref 5–15)
BUN: 16 mg/dL (ref 6–20)
CO2: 25 mmol/L (ref 22–32)
Calcium: 8.7 mg/dL — ABNORMAL LOW (ref 8.9–10.3)
Chloride: 102 mmol/L (ref 98–111)
Creatinine, Ser: 0.44 mg/dL (ref 0.44–1.00)
GFR, Estimated: >60 mL/min (ref >60)
Glucose, Bld: 83 mg/dL (ref 70–99)
Potassium: 3.5 mmol/L (ref 3.5–5.1)
Sodium: 134 mmol/L — ABNORMAL LOW (ref 135–145)
Total Bilirubin: 1.9 mg/dL — ABNORMAL HIGH (ref 0.3–1.2)
Total Protein: 6.5 g/dL (ref 6.5–8.1)

## 2021-08-13 LAB — CBC
HCT: 33.4 % — ABNORMAL LOW (ref 36.0–46.0)
Hemoglobin: 10.8 g/dL — ABNORMAL LOW (ref 12.0–15.0)
MCH: 28.6 pg (ref 26.0–34.0)
MCHC: 32.3 g/dL (ref 30.0–36.0)
MCV: 88.6 fL (ref 80.0–100.0)
Platelets: 195 10*3/uL (ref 150–400)
RBC: 3.77 MIL/uL — ABNORMAL LOW (ref 3.87–5.11)
RDW: 16 % — ABNORMAL HIGH (ref 11.5–15.5)
WBC: 3.9 10*3/uL — ABNORMAL LOW (ref 4.0–10.5)
nRBC: 0 % (ref 0.0–0.2)

## 2021-08-13 LAB — URINE CULTURE

## 2021-08-13 MED ORDER — CYCLOBENZAPRINE HCL 10 MG PO TABS
20.0000 mg | ORAL_TABLET | Freq: Two times a day (BID) | ORAL | Status: DC
  Administered 2021-08-13 – 2021-08-15 (×5): 20 mg via ORAL
  Filled 2021-08-13 (×5): qty 2

## 2021-08-13 MED ORDER — BUPROPION HCL ER (XL) 300 MG PO TB24
300.0000 mg | ORAL_TABLET | Freq: Every day | ORAL | Status: DC
  Administered 2021-08-13 – 2021-08-15 (×3): 300 mg via ORAL
  Filled 2021-08-13 (×3): qty 1

## 2021-08-13 MED ORDER — NEBIVOLOL HCL 10 MG PO TABS
10.0000 mg | ORAL_TABLET | Freq: Every day | ORAL | Status: DC
  Administered 2021-08-13 – 2021-08-15 (×3): 10 mg via ORAL
  Filled 2021-08-13 (×3): qty 1

## 2021-08-13 MED ORDER — GABAPENTIN 300 MG PO CAPS
600.0000 mg | ORAL_CAPSULE | Freq: Three times a day (TID) | ORAL | Status: DC
  Administered 2021-08-13 – 2021-08-15 (×6): 600 mg via ORAL
  Filled 2021-08-13 (×6): qty 2

## 2021-08-13 MED ORDER — THIAMINE HCL 100 MG PO TABS
100.0000 mg | ORAL_TABLET | Freq: Every day | ORAL | Status: DC
  Administered 2021-08-13 – 2021-08-15 (×3): 100 mg via ORAL
  Filled 2021-08-13 (×3): qty 1

## 2021-08-13 MED ORDER — VITAMIN B-12 1000 MCG PO TABS
1000.0000 ug | ORAL_TABLET | Freq: Every day | ORAL | Status: DC
  Administered 2021-08-13 – 2021-08-15 (×3): 1000 ug via ORAL
  Filled 2021-08-13 (×3): qty 1

## 2021-08-13 MED ORDER — OXCARBAZEPINE 300 MG PO TABS
300.0000 mg | ORAL_TABLET | Freq: Two times a day (BID) | ORAL | Status: DC
  Administered 2021-08-13 – 2021-08-15 (×5): 300 mg via ORAL
  Filled 2021-08-13 (×5): qty 1

## 2021-08-13 MED ORDER — DULOXETINE HCL 30 MG PO CPEP
60.0000 mg | ORAL_CAPSULE | Freq: Two times a day (BID) | ORAL | Status: DC
  Administered 2021-08-13 – 2021-08-15 (×5): 60 mg via ORAL
  Filled 2021-08-13 (×5): qty 2

## 2021-08-13 MED ORDER — VANCOMYCIN HCL 1750 MG/350ML IV SOLN: 1750.0000 mg | INTRAVENOUS | Status: DC

## 2021-08-13 MED ORDER — VANCOMYCIN HCL 10 G IV SOLR
2500.0000 mg | Freq: Once | INTRAVENOUS | Status: AC
Start: 2021-08-13 — End: 2021-08-13
  Administered 2021-08-13: 2500 mg via INTRAVENOUS
  Filled 2021-08-13: qty 25

## 2021-08-13 MED ORDER — CLONAZEPAM 1 MG PO TABS
1.0000 mg | ORAL_TABLET | Freq: Every day | ORAL | Status: DC
  Administered 2021-08-13 – 2021-08-14 (×2): 1 mg via ORAL
  Filled 2021-08-13 (×2): qty 1

## 2021-08-13 NOTE — Progress Notes (Signed)
PHARMACY - PHYSICIAN COMMUNICATION ?CRITICAL VALUE ALERT - BLOOD CULTURE IDENTIFICATION (BCID) ? ?[name] is an 51 y.o. female with  hx recurrent BLE cellulitis and UTI recently treated with abx who presented to [facility] on 08/12/2021 with a chief complaint of fever.  Two of Four blood culture bottles collected on 3/30 now have GPC (BCID= staph epi, mecA/C+). ? ?Name of physician (or Provider) Contacted: Dr. Lonny Prude ? ?Current antibiotics: none. She got one dose of levaquin in the ED on 3/30 at 12:29p ? ?Changes to prescribed antibiotics recommended:  ?- Per Dr. Lonny Prude, start vancomycin  ?- vancomycin 2500 mg IV x1, then 1750 mg IV q24h for est AUC 463.  RN informed that repeat blood cultures need to be collected first PRIOR to giving the first dose of vancomycin ? ?Results for orders placed or performed during the hospital encounter of 08/12/21  ?Blood Culture ID Panel (Reflexed) (Collected: 08/12/2021 12:21 PM)  ?Result Value Ref Range  ? Enterococcus faecalis NOT DETECTED NOT DETECTED  ? Enterococcus Faecium NOT DETECTED NOT DETECTED  ? Listeria monocytogenes NOT DETECTED NOT DETECTED  ? Staphylococcus species DETECTED (A) NOT DETECTED  ? Staphylococcus aureus (BCID) NOT DETECTED NOT DETECTED  ? Staphylococcus epidermidis DETECTED (A) NOT DETECTED  ? Staphylococcus lugdunensis NOT DETECTED NOT DETECTED  ? Streptococcus species NOT DETECTED NOT DETECTED  ? Streptococcus agalactiae NOT DETECTED NOT DETECTED  ? Streptococcus pneumoniae NOT DETECTED NOT DETECTED  ? Streptococcus pyogenes NOT DETECTED NOT DETECTED  ? A.calcoaceticus-baumannii NOT DETECTED NOT DETECTED  ? Bacteroides fragilis NOT DETECTED NOT DETECTED  ? Enterobacterales NOT DETECTED NOT DETECTED  ? Enterobacter cloacae complex NOT DETECTED NOT DETECTED  ? Escherichia coli NOT DETECTED NOT DETECTED  ? Klebsiella aerogenes NOT DETECTED NOT DETECTED  ? Klebsiella oxytoca NOT DETECTED NOT DETECTED  ? Klebsiella pneumoniae NOT DETECTED NOT DETECTED  ?  Proteus species NOT DETECTED NOT DETECTED  ? Salmonella species NOT DETECTED NOT DETECTED  ? Serratia marcescens NOT DETECTED NOT DETECTED  ? Haemophilus influenzae NOT DETECTED NOT DETECTED  ? Neisseria meningitidis NOT DETECTED NOT DETECTED  ? Pseudomonas aeruginosa NOT DETECTED NOT DETECTED  ? Stenotrophomonas maltophilia NOT DETECTED NOT DETECTED  ? Candida albicans NOT DETECTED NOT DETECTED  ? Candida auris NOT DETECTED NOT DETECTED  ? Candida glabrata NOT DETECTED NOT DETECTED  ? Candida krusei NOT DETECTED NOT DETECTED  ? Candida parapsilosis NOT DETECTED NOT DETECTED  ? Candida tropicalis NOT DETECTED NOT DETECTED  ? Cryptococcus neoformans/gattii NOT DETECTED NOT DETECTED  ? Methicillin resistance mecA/C DETECTED (A) NOT DETECTED  ? ? ?Dia Sitter P ?08/13/2021  9:57 AM ? ?

## 2021-08-13 NOTE — Evaluation (Signed)
Physical Therapy Evaluation ?Patient Details ?Name: [name] ?MRN: [id number] ?DOB: 17-Apr-1971 ?Today's Date: 08/13/2021 ? ?History of Present Illness ? [name] is a 51 y.o. female with a history of depression, hyperlipidemia, hypertension, compressive cervical myelomalacia, history of cholecystectomy.  Admitted due to fever and concern for sepsis with blood cultures positive with staphylococcus epidermidis.  ?Clinical Impression ? Patient presents with decreased mobility due to  generalized weakness, decreased balance and R knee pain.  At baseline does some standing with assist and transfers to w/c with assist and was getting HHPT/OT/RN/aide.  Feel she can return home with family and [facility] services and will need ambulance transport home.  PT will follow acutely as well.  ?   ? ?Recommendations for follow up therapy are one component of a multi-disciplinary discharge planning process, led by the attending physician.  Recommendations may be updated based on patient status, additional functional criteria and insurance authorization. ? ?Follow Up Recommendations Home health PT ? ?  ?Assistance Recommended at Discharge Frequent or constant Supervision/Assistance  ?Patient can return home with the following ? Two people to help with walking and/or transfers;A lot of help with bathing/dressing/bathroom;Assist for transportation ? ?  ?Equipment Recommendations None recommended by PT  ?Recommendations for Other Services ?    ?  ?Functional Status Assessment Patient has had a recent decline in their functional status and demonstrates the ability to make significant improvements in function in a reasonable and predictable amount of time.  ? ?  ?Precautions / Restrictions Precautions ?Precautions: Fall ?Precaution Comments: denies recent falls at home  ? ?  ? ?Mobility ? Bed Mobility ?Overal bed mobility: Needs Assistance ?Bed Mobility: Supine to Sit, Sit to Supine ?  ?  ?Supine to sit: Min assist, HOB elevated ?Sit to  supine: Mod assist ?  ?General bed mobility comments: assist for trunk; to supine assist for legs, pt able to pull up in bed with rail ?  ? ?Transfers ?Overall transfer level: Needs assistance ?  ?  ?  ?  ?  ?  ?  ?  ?General transfer comment: attempted sit to stand, but R knee too painful, attempted lateral scooting, but minimal with minguard A ?  ? ?Ambulation/Gait ?  ?  ?  ?  ?  ?  ?  ?  ? ?Stairs ?  ?  ?  ?  ?  ? ?Wheelchair Mobility ?  ? ?Modified Rankin (Stroke Patients Only) ?  ? ?  ? ?Balance Overall balance assessment: Needs assistance ?  ?Sitting balance-Leahy Scale: Good ?  ?  ?  ?  ?  ?  ?  ?  ?  ?  ?  ?  ?  ?  ?  ?  ?   ? ? ? ?Pertinent Vitals/Pain Pain Assessment ?Pain Assessment: Faces ?Faces Pain Scale: Hurts even more ?Pain Location: R knee, RN medicated ?Pain Descriptors / Indicators: Aching, Discomfort, Moaning ?Pain Intervention(s): Monitored during session, Repositioned, RN gave pain meds during session  ? ? ?Home Living Family/patient expects to be discharged to:: Private residence ?Living Arrangements: Children;Other relatives ?Available Help at Discharge: Family ?Type of Home: House ?Home Access: Level entry ?  ?  ?  ?Home Layout: Two level;Able to live on main level with bedroom/bathroom ?Home Equipment: Rollator (4 wheels);Shower seat;Hospital bed;Wheelchair - manual ?   ?  ?Prior Function Prior Level of Function : Needs assist ?  ?  ?  ?Physical Assist : Mobility (physical);ADLs (physical) ?Mobility (physical): Bed mobility;Transfers;Gait ?  ADLs (physical): Toileting;IADLs;Bathing ?Mobility Comments: has aide for bath, son works intermittently when someone else can be with her; help for transfers and standing, not much gait, but some with HH ?ADLs Comments: has been doing some transfers to [facility], but also uses briefs, states to get pure wick at home ?  ? ? ?Hand Dominance  ? Dominant Hand: Right ? ?  ?Extremity/Trunk Assessment  ? Upper Extremity Assessment ?Upper Extremity Assessment:  Overall WFL for tasks assessed ?  ? ?Lower Extremity Assessment ?Lower Extremity Assessment: RLE deficits/detail;LLE deficits/detail ?RLE Deficits / Details: R knee with significant crepitus, AAROM limited by pain, strength 2+/5 ?RLE Coordination: decreased gross motor ?LLE Deficits / Details: AAROM WFL, strength 2/5 ?LLE Coordination: decreased gross motor ?  ? ?   ?Communication  ? Communication: No difficulties  ?Cognition Arousal/Alertness: Awake/alert ?Behavior During Therapy: [name] for tasks assessed/performed ?Overall Cognitive Status: Within Functional Limits for tasks assessed ?  ?  ?  ?  ?  ?  ?  ?  ?  ?  ?  ?  ?  ?  ?  ?  ?  ?  ?  ? ?  ?General Comments General comments (skin integrity, edema, etc.): scrapes on L lower leg, R knee wrapped with ace wrap during session to try and help pain ? ?  ?Exercises    ? ?Assessment/Plan  ?  ?PT Assessment Patient needs continued PT services  ?PT Problem List Decreased strength;Decreased mobility;Decreased balance;Decreased activity tolerance;Pain;Decreased knowledge of precautions ? ?   ?  ?PT Treatment Interventions DME instruction;Therapeutic activities;Therapeutic exercise;Patient/family education;Balance training;Functional mobility training   ? ?PT Goals (Current goals can be found in the Care Plan section)  ?Acute Rehab PT Goals ?Patient Stated Goal: to go home soom ?PT Goal Formulation: With patient ?Time For Goal Achievement: 08/27/21 ?Potential to Achieve Goals: Fair ? ?  ?Frequency Min 3X/week ?  ? ? ?Co-evaluation   ?  ?  ?  ?  ? ? ?  ?AM-PAC PT "6 Clicks" Mobility  ?Outcome Measure Help needed turning from your back to your side while in a flat bed without using bedrails?: A Lot ?Help needed moving from lying on your back to sitting on the side of a flat bed without using bedrails?: A Lot ?Help needed moving to and from a bed to a chair (including a wheelchair)?: Total ?Help needed standing up from a chair using your arms (e.g., wheelchair or bedside  chair)?: Total ?Help needed to walk in hospital room?: Total ?Help needed climbing 3-5 steps with a railing? : Total ?6 Click Score: 8 ? ?  ?End of Session Equipment Utilized During Treatment: Gait belt ?Activity Tolerance: Patient limited by pain ?Patient left: in bed;with call bell/phone within reach ?Nurse Communication: Mobility status ?PT Visit Diagnosis: Muscle weakness (generalized) (M62.81);Other symptoms and signs involving the nervous system (R29.898);Other abnormalities of gait and mobility (R26.89) ?  ? ?Time: 1275-1700 ?PT Time Calculation (min) (ACUTE ONLY): 28 min ? ? ?Charges:   PT Evaluation ?$PT Eval Moderate Complexity: 1 Mod ?PT Treatments ?$Therapeutic Activity: 8-22 mins ?  ?   ? ? ?Magda Kiel, PT ?Acute Rehabilitation Services ?FVCBS:[phone] ?[phone] ?08/13/2021 ? ? ?Reginia Naas ?08/13/2021, 5:04 PM ? ?

## 2021-08-13 NOTE — Progress Notes (Signed)
? ?PROGRESS NOTE ? ? ? ?[name]  DPO:[id number] DOB: 1970-11-18 DOA: 08/12/2021 ?PCP: [name], MD ? ? ?Brief Narrative: ?[name] is a 51 y.o. female with a history of depression, hyperlipidemia, hypertension, compressive cervical myelomalacia, history of cholecystectomy. Patient presented secondary to fever noticed by her home health nurse in addition to hypotension. In the ED, patient received empiric Levaquin in addition to an IV fluid bolus. Blood cultures obtained on admission grew staphylococcus epidermidis, seen on BCID. Empiric vancomycin initiated. ? ?Assessment and Plan: ? ?Fevers ?Positive blood culture ?Fever initially with unknown etiology. Blood cultures (3/30) resulted positive with staphylococcus epidermidis on BCID. History also significant for upper respiratory symptoms (sneezing, rhinorrhea) although this may be related to seasonal allergies.  ?-Repeat blood cultures (3/31) ?-ID consult ?-Vancomycin IV empirically ? ?Hypotension ?Mild and resolved with IV fluid bolus in the ED. ? ?Hypocalcemia ?Mild and has resolved once adjusted for albumin. ? ?Hyponatremia ?Sodium of 126 on admission. Mostly resolved with IV fluids. ? ?Compressive cervical myelomalacia ?Impaired mobility ?Patient follows with neurosurgery. Patient currently working with home health therapy ?-PT/OT eval ? ?Anxiety ?Depression ?-Continue Cymbalta, Klonopin, Wellbutrin XL ? ?Hyperlipidemia ?Crestor held secondary to elevated AST/ALT ? ?Elevated AST/ALT ?Elevated bilirubin ?Unsure of etiology. History of cholecystectomy with stable biliary ductal dilation on RUQ ultrasound. Hepatitis panel negative. Recently on Macrobid. Also with possible viral illness, which may have contributed to AST/ALT. AST/ALT/bilirubin trending down and patient without abdominal symptoms. Alkaline phosphatase is stable. ? ? ?DVT prophylaxis: Lovenox ?Code Status:   Code Status: Full Code ?Family Communication: None at bedside ?Disposition  Plan: Discharge home vs SNF pending PT recommendations in addition to management for positive blood cultures and ID recommendations ? ? ?Consultants:  ?Infectious disease ? ?Procedures:  ?None ? ?Antimicrobials: ?Levaquin ?Vancomycin  ? ? ?Subjective: ?Patient reports feeling much better today. No issues overnight. Lives at home. Son lives with her. She mobilizes mainly by wheelchair now but does transfer to walker to stand. ? ?Objective: ?BP 121/68 (BP Location: Right Wrist)   Pulse 76   Temp 97.8 ?F (36.6 ?C) (Oral)   Resp 20   SpO2 93%  ? ?Examination: ? ?General exam: Appears calm and comfortable ?Respiratory system: Clear to auscultation. Respiratory effort normal. ?Cardiovascular system: S1 & S2 heard, RRR. No murmurs, rubs, gallops or clicks. ?Gastrointestinal system: Abdomen is nondistended, soft and nontender. No organomegaly or masses felt. Normal bowel sounds heard. ?Central nervous system: Alert and oriented. No focal neurological deficits. ?Musculoskeletal:  No calf tenderness. No LE erythema noted. ?Skin: No cyanosis. No rashes ?Psychiatry: Judgement and insight appear normal. Mood & affect appropriate.  ? ? ?Data Reviewed: I have personally reviewed following labs and imaging studies ? ?CBC ?Lab Results  ?Component Value Date  ? WBC 3.9 (L) 08/13/2021  ? RBC 3.77 (L) 08/13/2021  ? HGB 10.8 (L) 08/13/2021  ? HCT 33.4 (L) 08/13/2021  ? MCV 88.6 08/13/2021  ? MCH 28.6 08/13/2021  ? PLT 195 08/13/2021  ? MCHC 32.3 08/13/2021  ? RDW 16.0 (H) 08/13/2021  ? LYMPHSABS 0.3 (L) 08/12/2021  ? MONOABS 0.4 08/12/2021  ? EOSABS 0.2 08/12/2021  ? BASOSABS 0.0 08/12/2021  ? ? ? ?Last metabolic panel ?Lab Results  ?Component Value Date  ? NA 134 (L) 08/13/2021  ? K 3.5 08/13/2021  ? CL 102 08/13/2021  ? CO2 25 08/13/2021  ? BUN 16 08/13/2021  ? CREATININE 0.44 08/13/2021  ? GLUCOSE 83 08/13/2021  ? GFRNONAA >60 08/13/2021  ?  GFRAA >60 08/02/2019  ? CALCIUM 8.7 (L) 08/13/2021  ? PHOS 4.6 01/14/2016  ? PROT 6.5  08/13/2021  ? ALBUMIN 3.0 (L) 08/13/2021  ? BILITOT 1.9 (H) 08/13/2021  ? ALKPHOS 182 (H) 08/13/2021  ? AST 67 (H) 08/13/2021  ? ALT 69 (H) 08/13/2021  ? ANIONGAP 7 08/13/2021  ? ? ?GFR: ?CrCl cannot be calculated (Unknown ideal weight.). ? ?Recent Results (from the past 240 hour(s))  ?Blood Culture (routine x 2)     Status: None (Preliminary result)  ? Collection Time: 08/12/21 12:21 PM  ? Specimen: BLOOD  ?Result Value Ref Range Status  ? Specimen Description   Final  ?  BLOOD RIGHT ANTECUBITAL ?Performed at [facility], [place] [address] [place], [place] [address] ?  ? Special Requests   Final  ?  BOTTLES DRAWN AEROBIC AND ANAEROBIC Blood Culture adequate volume ?Performed at [facility], [place] [address], [place], [place] [address] ?  ? Culture  Setup Time   Final  ?  GRAM POSITIVE COCCI IN CLUSTERS ?AEROBIC BOTTLE ONLY ?CRITICAL RESULT CALLED TO, READ BACK BY AND VERIFIED WITH: PHARMD ABBIE E. [phone] FCP ?Performed at [facility], [place] [address], [place], [place] [address] ?  ? Culture GRAM POSITIVE COCCI  Final  ? Report Status PENDING  Incomplete  ?Blood Culture (routine x 2)     Status: None (Preliminary result)  ? Collection Time: 08/12/21 12:21 PM  ? Specimen: BLOOD LEFT HAND  ?Result Value Ref Range Status  ? Specimen Description   Final  ?  BLOOD LEFT HAND ?Performed at [facility], [place] [address] [place], [place] [address] ?  ? Special Requests   Final  ?  BOTTLES DRAWN AEROBIC AND ANAEROBIC Blood Culture adequate volume ?Performed at [facility], [place] [address], [place], [place] [address] ?  ? Culture  Setup Time   Final  ?  GRAM POSITIVE COCCI IN CLUSTERS ?ANAEROBIC BOTTLE ONLY ?CRITICAL VALUE NOTED.  VALUE IS CONSISTENT WITH PREVIOUSLY REPORTED AND CALLED VALUE. ?Performed at [facility], [place] [address], [place], [place] [address] ?  ? Culture GRAM POSITIVE COCCI  Final  ? Report Status PENDING  Incomplete   ?Blood Culture ID Panel (Reflexed)     Status: Abnormal  ? Collection Time: 08/12/21 12:21 PM  ?Result Value Ref Range Status  ? Enterococcus faecalis NOT DETECTED NOT DETECTED Final  ? Enterococcus Faecium NOT DETECTED NOT DETECTED Final  ? Listeria monocytogenes NOT DETECTED NOT DETECTED Final  ? Staphylococcus species DETECTED (A) NOT DETECTED Final  ?  Comment: CRITICAL RESULT CALLED TO, READ BACK BY AND VERIFIED WITH: ?PHARMD ABBIE E. [phone] FCP ?  ? Staphylococcus aureus (BCID) NOT DETECTED NOT DETECTED Final  ? Staphylococcus epidermidis DETECTED (A) NOT DETECTED Final  ?  Comment: Methicillin (oxacillin) resistant coagulase negative staphylococcus. Possible blood culture contaminant (unless isolated from more than one blood culture draw or clinical case suggests pathogenicity). No antibiotic treatment is indicated for blood  ?culture contaminants. ?CRITICAL RESULT CALLED TO, READ BACK BY AND VERIFIED WITH: ?PHARMD ABBIE E. [phone] FCP ?  ? Staphylococcus lugdunensis NOT DETECTED NOT DETECTED Final  ? Streptococcus species NOT DETECTED NOT DETECTED Final  ? Streptococcus agalactiae NOT DETECTED NOT DETECTED Final  ? Streptococcus pneumoniae NOT DETECTED NOT DETECTED Final  ? Streptococcus pyogenes NOT DETECTED NOT DETECTED Final  ? A.calcoaceticus-baumannii NOT DETECTED NOT DETECTED Final  ? Bacteroides fragilis NOT  DETECTED NOT DETECTED Final  ? Enterobacterales NOT DETECTED NOT DETECTED Final  ? Enterobacter cloacae complex NOT DETECTED NOT DETECTED Final  ? Escherichia coli NOT DETECTED NOT DETECTED Final  ? Klebsiella aerogenes NOT DETECTED NOT DETECTED Final  ? Klebsiella oxytoca NOT DETECTED NOT DETECTED Final  ? Klebsiella pneumoniae NOT DETECTED NOT DETECTED Final  ? Proteus species NOT DETECTED NOT DETECTED Final  ? Salmonella species NOT DETECTED NOT DETECTED Final  ? Serratia marcescens NOT DETECTED NOT DETECTED Final  ? Haemophilus influenzae NOT DETECTED NOT DETECTED Final  ? Neisseria  meningitidis NOT DETECTED NOT DETECTED Final  ? Pseudomonas aeruginosa NOT DETECTED NOT DETECTED Final  ? Stenotrophomonas maltophilia NOT DETECTED NOT DETECTED Final  ? Candida albicans NOT DETECTED NOT DET

## 2021-08-13 NOTE — Consult Note (Signed)
? ? ?[facility]  ?                                                                                     ? ?Patient Identification: ?Patient Name: [name] MRN: [id number] [place] Date: 08/12/2021 11:09 AM ?Today's Date: 08/13/2021 ?Reason for consult: Bacteremia  ?Requesting provider: Gardenia Phlegm ? ?Principal Problem: ?  Fever of unknown origin ?Active Problems: ?  PNA (pneumonia) ? ? ?Antibiotics:  ?Levofloxacin 3/30 ? ?Lines/Hardware: ? ?Assessment ?51 year old female with PMH of obesity/[name] s/p gastric bypass, compressive myelomalacia of the C-spine HTN, HLD, Fibromyalgia, cervical ca, CTS s/p CTS release, anxiety/depression with  ? ?Reported h/o fevers - no documented fevers here, no leukcoytosis, hemodynamically stable and appears to be at baseline on conversation with patient and long time friend at bedside. UA 21-50 WBCs, Urine cx multiple species, no GU symptoms. Blood cx with MRSE, possible contaminant. No respiratory and GI symptoms. Chronic appearing excoriations or scars in the lower legs with no cellulitis  ?  ?MRSE in both sets ( both sets drawn from separate sites but same time of collection)- suspect this to be contaminant. she is stable otherwise with  no concerns of infection, would recommend 2 sets of blood culture ? ?Deranged LFTs - acute hepatic panel negative, improving, concern for viral illness with reported fevers and transaminitis  ? ?Compressive Cervical Myelomalacia with impaired mobility  ? ?Recommendations  ?Patient had 2 sets of blood cultures drawn earlier this morning after which Vancomycin was started. Given high chances of contamination with no infective concerns otherwise, recommend to DC vancomycin and fu repeat blood cultures. Will determine need to further work up for MRSE depending of repeat cultures  ? ?If she starts having signs of sepsis, recommend to start her on Vancomycin at that  time ? ?Dr Juleen China is on call this weekend and will follow cultures. Please call with questions  ? ?Rest of the management as per the primary team. Please call with questions or concerns.  ?Thank you for the consult ? ?Rosiland Oz, MD ?Infectious Disease Physician ?[facility] ?[address] ?[place], [place] [address] ?Phone: [phone]  Fax: [phone] ? ?__________________________________________________________________________________________________________ ?HPI and Hospital Course: ?51 year old female with PMH of obesity/[name] s/p gastric bypass, compressive myelomalacia of the C-spine HTN, HLD, Fibromyalgia, cervical ca, CTS s/p CTS release, anxiety/depression who presented to the ED on 3/30 with fevers for last 2 days, Low BP during EMS arrival and was given fluid bolus.  She was recently treated for UTI with Macrobid on 3/24 for 7 days and Doxy for 7 days on 3/15 for cellulitis of her legs ? ?At ED, afebrile ?Labs remarkable for Na 126, K 3.4, Cr 0.57, ALP 174, AST 110, ALT 88, TB 2.5 ?LA 1.1 ?WBC 6.8 ?Chest xray with Bilateral patchy and ground-glass opacities concerning for airspace disease, differential includes bilateral pneumonia or pulmonary edema. Clinical correlation is suggested. ? ?CT chest  No active cardiopulmonary abnormalities. Specifically, no evidence for pneumonia. Compression deformities are noted at T6 and T12. These are unchanged when compared with 05/03/2021 ? ?Rt UQ [place] ?Status post  cholecystectomy. Stable extrahepatic biliary ductal dilation likely representing post cholecystectomy change. ? ?ROS: ?General- Denies loss of appetite and loss of weight, fevers and chills + ?HEENT - Denies headache, blurry vision, neck pain, sinus pain ?Chest - Denies any chest pain, SOB or cough ?CVS- Denies any dizziness/lightheadedness, syncopal attacks, palpitations ?Abdomen- Denies any nausea, vomiting, abdominal pain, hematochezia and  diarrhea ?Neuro - Denies any weakness, numbness, tingling sensation ?Psych - Denies any changes in mood irritability or depressive symptoms ?GU- Denies any burning, dysuria, hematuria or increased frequency of urination ?Skin - chronic appearing lesions in her lower extremities  ?MSK - denies any joint pain/swelling or restricted ROM  ? ?Past Medical History:  ?Diagnosis Date  ? Allergic rhinitis   ? Ankle fracture, right   ? Anxiety   ? ASCUS of cervix with negative high risk HPV 05/2018  ? Carpal tunnel syndrome   ? Cervical cancer ([place]) 1998  ? Fibromyalgia   ? Hyperlipidemia   ? Hypertension   ? Migraine headache   ? Obesity   ? Sleep apnea   ? ?Past Surgical History:  ?Procedure Laterality Date  ? ABDOMINAL HYSTERECTOMY  10/1996  ? ABDOMINAL SURGERY  jan/11/2012  ? gastric bypass surgery  ? CARPAL TUNNEL RELEASE    ? bilateral   ? CESAREAN SECTION    ? CHOLECYSTECTOMY  07/1992  ? ESOPHAGOGASTRODUODENOSCOPY N/A 01/14/2016  ? Procedure: ESOPHAGOGASTRODUODENOSCOPY (EGD);  Surgeon: Mauri Pole, MD;  Location: [facility] ENDOSCOPY;  Service: Endoscopy;  Laterality: N/A;  ? ? ? ?Scheduled Meds: ? calcium carbonate  1 tablet Oral BID WC  ? enoxaparin (LOVENOX) injection  40 mg Subcutaneous Q24H  ? ?Continuous Infusions: ?PRN Meds:.acetaminophen **OR** acetaminophen ? ?Allergies  ?Allergen Reactions  ? Niacin Anaphylaxis, Shortness Of Breath and Swelling  ?  Swelling and "problems breathing"  ? Sulfamethoxazole-Trimethoprim Anaphylaxis, Swelling, Rash and Other (See Comments)  ?  Throat closed and the eyes became swollen  ? Aspirin Hives  ? Bactrim Hives  ? Benzoin Compound Rash  ? Cephalexin Hives  ?  Tolerated ceftriaxone and cefepime 07/2019  ? Clindamycin/Lincomycin Hives  ? Iohexol Hives  ?  Pt treated with PO benedryl  ? Lisinopril Hives  ? Naproxen Hives  ? Pnu-Imune [Pneumococcal Polysaccharide Vaccine] Rash  ? Sulfonamide Derivatives Rash  ? ?Social History  ? ?Socioeconomic History  ? Marital status: Single  ?   Spouse name: Not on file  ? Number of children: Not on file  ? Years of education: Not on file  ? Highest education level: Not on file  ?Occupational History  ? Occupation: disabled. 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 Smoking status: Never  ? Smokeless tobacco: Never  ?Vaping Use  ? Vaping Use: Never used  ?Substance and Sexual Activity  ? Alcohol use: No  ? Drug use: No  ? Sexual activity: Yes  ?  Comment: TAH-1st intercourse 25 yo-5 partners  ?Other Topics Concern  ? Not on file  ?Social History Narrative  ? Not on file  ? ?Social Determinants of Health  ? ?Financial Resource Strain: Low Risk   ? Difficulty of Paying Living Expenses: Not hard at all  ?Food Insecurity: Not on file  ?Transportation Needs: Not on file  ?Physical Activity: Inactive  ? Days of Exercise per Week: 0 days  ? Minutes of Exercise per Session: 0 min  ?Stress: No Stress Concern Present  ? Feeling of Stress : Only a little  ?Social Connections: Moderately Isolated  ?  Frequency of Communication with Friends and Family: More than three times a week  ? Frequency of Social Gatherings with Friends and Family: More than three times a week  ? Attends Religious Services: More than 4 times per year  ? Active Member of Clubs or Organizations: No  ? Attends Archivist Meetings: Never  ? Marital Status: Never married  ?Intimate Partner Violence: Not At Risk  ? Fear of Current or Ex-Partner: No  ? Emotionally Abused: No  ? Physically Abused: No  ? Sexually Abused: No  ? ? ?Breast Cancer-relatedfamily history includes Breast cancer (age of onset: 110) in her maternal grandmother. ? ? ?Vitals ?BP 121/68 (BP Location: Right Wrist)   Pulse 76   Temp 97.8 ?F (36.6 ?C) (Oral)   Resp 20   SpO2 93%  ? ? ?Physical Exam ?Constitutional: Lying in the bed, morbidly obese ?   Comments:  ? ?Cardiovascular:  ?   Rate and Rhythm: Normal rate and regular rhythm.  ?   Heart sounds: ? ?Pulmonary:  ?   Effort: Pulmonary effort is normal on room air ?    Comments: Bilateral clear air entry ? ?Abdominal:  ?   Palpations: Abdomen is soft.  ?   Tenderness: Nondistended, nontender ? ?Musculoskeletal:     ?   General: No swelling or tenderness.  ? ?Skin: ?   Comments:

## 2021-08-14 DIAGNOSIS — F32A Depression, unspecified: Secondary | ICD-10-CM | POA: Diagnosis not present

## 2021-08-14 DIAGNOSIS — I10 Essential (primary) hypertension: Secondary | ICD-10-CM

## 2021-08-14 DIAGNOSIS — R7881 Bacteremia: Secondary | ICD-10-CM | POA: Diagnosis not present

## 2021-08-14 DIAGNOSIS — R509 Fever, unspecified: Secondary | ICD-10-CM | POA: Diagnosis not present

## 2021-08-14 DIAGNOSIS — F419 Anxiety disorder, unspecified: Secondary | ICD-10-CM | POA: Diagnosis not present

## 2021-08-14 LAB — COMPREHENSIVE METABOLIC PANEL
ALT: 46 U/L — ABNORMAL HIGH (ref 0–44)
AST: 35 U/L (ref 15–41)
Albumin: 3.1 g/dL — ABNORMAL LOW (ref 3.5–5.0)
Alkaline Phosphatase: 227 U/L — ABNORMAL HIGH (ref 38–126)
Anion gap: 7 (ref 5–15)
BUN: 12 mg/dL (ref 6–20)
CO2: 26 mmol/L (ref 22–32)
Calcium: 8.9 mg/dL (ref 8.9–10.3)
Chloride: 104 mmol/L (ref 98–111)
Creatinine, Ser: 0.42 mg/dL — ABNORMAL LOW (ref 0.44–1.00)
GFR, Estimated: >60 mL/min (ref >60)
Glucose, Bld: 107 mg/dL — ABNORMAL HIGH (ref 70–99)
Potassium: 3.4 mmol/L — ABNORMAL LOW (ref 3.5–5.1)
Sodium: 137 mmol/L (ref 135–145)
Total Bilirubin: 0.9 mg/dL (ref 0.3–1.2)
Total Protein: 6.5 g/dL (ref 6.5–8.1)

## 2021-08-14 MED ORDER — HYDROCODONE-ACETAMINOPHEN 10-325 MG PO TABS
1.0000 | ORAL_TABLET | Freq: Four times a day (QID) | ORAL | Status: DC | PRN
  Administered 2021-08-14: 1 via ORAL
  Filled 2021-08-14: qty 1

## 2021-08-14 MED ORDER — ROSUVASTATIN CALCIUM 10 MG PO TABS
20.0000 mg | ORAL_TABLET | Freq: Every day | ORAL | Status: DC
Start: 2021-08-14 — End: 2021-08-15
  Administered 2021-08-14 – 2021-08-15 (×2): 20 mg via ORAL
  Filled 2021-08-14 (×2): qty 2

## 2021-08-14 MED ORDER — PANTOPRAZOLE SODIUM 40 MG PO TBEC
40.0000 mg | DELAYED_RELEASE_TABLET | Freq: Two times a day (BID) | ORAL | Status: DC
Start: 2021-08-14 — End: 2021-08-15
  Administered 2021-08-14 – 2021-08-15 (×3): 40 mg via ORAL
  Filled 2021-08-14 (×3): qty 1

## 2021-08-14 NOTE — Progress Notes (Signed)
? ?PROGRESS NOTE ? ? ? ?[name]  BDZ:[id number] DOB: [birth year] DOA: 08/12/2021 ?PCP: [name], MD ? ? ?Brief Narrative: ?[name] is a 51 y.o. female with a history of depression, hyperlipidemia, hypertension, compressive cervical myelomalacia, history of cholecystectomy. Patient presented secondary to fever noticed by her home health nurse in addition to hypotension. In the ED, patient received empiric Levaquin in addition to an IV fluid bolus. Blood cultures obtained on admission grew staphylococcus epidermidis, seen on BCID. Empiric vancomycin initiated. ? ?Assessment and Plan: ? ?Fevers ?Positive blood culture ?Fever initially with unknown etiology. Blood cultures (3/30) resulted positive with staphylococcus epidermidis on BCID. History also significant for upper respiratory symptoms (sneezing, rhinorrhea) although this may be related to seasonal allergies; RVP negative. ID consulted on 3/31. ?-Repeat blood cultures (3/31) ?-ID recommendations: Discontinue vancomycin, follow-up repeat blood cultures ? ?Hypotension ?Mild and resolved with IV fluid bolus in the ED. ? ?Hypocalcemia ?Mild and has resolved once adjusted for albumin. ? ?Hyponatremia ?Sodium of 126 on admission. Mostly resolved with IV fluids. ? ?Compressive cervical myelomalacia ?Impaired mobility ?Patient follows with neurosurgery. Patient currently working with home health therapy. PT/OT recommending home health. ? ?Anxiety ?Depression ?-Continue Cymbalta, Klonopin, Wellbutrin XL ? ?Hyperlipidemia ?Crestor held secondary to elevated AST/ALT ? ?Elevated AST/ALT ?Elevated bilirubin ?Unsure of etiology. History of cholecystectomy with stable biliary ductal dilation on RUQ ultrasound. Hepatitis panel negative. Recently on Macrobid. Also with possible viral illness, which may have contributed to AST/ALT. AST/ALT/bilirubin trending down and patient without abdominal symptoms. Alkaline phosphatase is stable. ?-Repeat CMP ? ? ?DVT  prophylaxis: Lovenox ?Code Status:   Code Status: Full Code ?Family Communication: None at bedside ?Disposition Plan: Discharge home pending management for positive blood cultures and ID recommendations ? ? ?Consultants:  ?Infectious disease ? ?Procedures:  ?None ? ?Antimicrobials: ?Levaquin ?Vancomycin  ? ? ?Subjective: ?No issues this morning. Feels well. ? ?Objective: ?BP 128/79 (BP Location: Left Arm)   Pulse 64   Temp 98 ?F (36.7 ?C) (Oral)   Resp 18   SpO2 99%  ? ?Examination: ? ?General: Well appearing, no distress ? ?Data Reviewed: I have personally reviewed following labs and imaging studies ? ?CBC ?Lab Results  ?Component Value Date  ? WBC 3.9 (L) 08/13/2021  ? RBC 3.77 (L) 08/13/2021  ? HGB 10.8 (L) 08/13/2021  ? HCT 33.4 (L) 08/13/2021  ? MCV 88.6 08/13/2021  ? MCH 28.6 08/13/2021  ? PLT 195 08/13/2021  ? MCHC 32.3 08/13/2021  ? RDW 16.0 (H) 08/13/2021  ? LYMPHSABS 0.3 (L) 08/12/2021  ? MONOABS 0.4 08/12/2021  ? EOSABS 0.2 08/12/2021  ? BASOSABS 0.0 08/12/2021  ? ? ? ?Last metabolic panel ?Lab Results  ?Component Value Date  ? NA 134 (L) 08/13/2021  ? K 3.5 08/13/2021  ? CL 102 08/13/2021  ? CO2 25 08/13/2021  ? BUN 16 08/13/2021  ? CREATININE 0.44 08/13/2021  ? GLUCOSE 83 08/13/2021  ? GFRNONAA >60 08/13/2021  ? GFRAA >60 08/02/2019  ? CALCIUM 8.7 (L) 08/13/2021  ? PHOS 4.6 01/14/2016  ? PROT 6.5 08/13/2021  ? ALBUMIN 3.0 (L) 08/13/2021  ? BILITOT 1.9 (H) 08/13/2021  ? ALKPHOS 182 (H) 08/13/2021  ? AST 67 (H) 08/13/2021  ? ALT 69 (H) 08/13/2021  ? ANIONGAP 7 08/13/2021  ? ? ?GFR: ?CrCl cannot be calculated (Unknown ideal weight.). ? ?Recent Results (from the past 240 hour(s))  ?Blood Culture (routine x 2)     Status: Abnormal (Preliminary result)  ? Collection Time: 08/12/21  12:21 PM  ? Specimen: BLOOD  ?Result Value Ref Range Status  ? Specimen Description   Final  ?  BLOOD RIGHT ANTECUBITAL ?Performed at [facility], [place] [address] [place], [place] [address] ?  ? Special  Requests   Final  ?  BOTTLES DRAWN AEROBIC AND ANAEROBIC Blood Culture adequate volume ?Performed at [facility], [place] [address] [place], [place] [address] ?  ? Culture  Setup Time   Final  ?  GRAM POSITIVE COCCI IN CLUSTERS ?AEROBIC BOTTLE ONLY ?CRITICAL RESULT CALLED TO, READ BACK BY AND VERIFIED WITH: PHARMD ABBIE E. [id number] FCP ?  ? Culture (A)  Final  ?  STAPHYLOCOCCUS EPIDERMIDIS ?CULTURE REINCUBATED FOR BETTER GROWTH ?Performed at [facility], [place] [address] [place], [place] [address] ?  ? Report Status PENDING  Incomplete  ?Blood Culture (routine x 2)     Status: Abnormal (Preliminary result)  ? Collection Time: 08/12/21 12:21 PM  ? Specimen: BLOOD LEFT HAND  ?Result Value Ref Range Status  ? Specimen Description   Final  ?  BLOOD LEFT HAND ?Performed at [facility], [place] [address] [place], [place] [address] ?  ? Special Requests   Final  ?  BOTTLES DRAWN AEROBIC AND ANAEROBIC Blood Culture adequate volume ?Performed at [facility], [place] [address], [place], [place] [address] ?  ? Culture  Setup Time   Final  ?  GRAM POSITIVE COCCI IN CLUSTERS ?ANAEROBIC BOTTLE ONLY ?CRITICAL VALUE NOTED.  VALUE IS CONSISTENT WITH PREVIOUSLY REPORTED AND CALLED VALUE. ?  ? Culture (A)  Final  ?  STAPHYLOCOCCUS EPIDERMIDIS ?SUSCEPTIBILITIES TO FOLLOW ?Performed at [facility], [place] [address] [place],  [address] ?  ? Report Status PENDING  Incomplete  ?Blood Culture ID Panel (Reflexed)     Status: Abnormal  ? Collection Time: 08/12/21 12:21 PM  ?Result Value Ref Range Status  ? Enterococcus faecalis NOT DETECTED NOT DETECTED Final  ? Enterococcus Faecium NOT DETECTED NOT DETECTED Final  ? Listeria monocytogenes NOT DETECTED NOT DETECTED Final  ? Staphylococcus species DETECTED (A) NOT DETECTED Final  ?  Comment: CRITICAL RESULT CALLED TO, READ BACK BY AND VERIFIED WITH: ?PHARMD ABBIE E. [id number] FCP ?  ? Staphylococcus aureus (BCID) NOT  DETECTED NOT DETECTED Final  ? Staphylococcus epidermidis DETECTED (A) NOT DETECTED Final  ?  Comment: Methicillin (oxacillin) resistant coagulase negative staphylococcus. Possible blood culture contaminant (unless isolated from more than one blood culture draw or clinical case suggests pathogenicity). No antibiotic treatment is indicated for blood  ?culture contaminants. ?CRITICAL RESULT CALLED TO, READ BACK BY AND VERIFIED WITH: ?PHARMD ABBIE E. [id number] FCP ?  ? Staphylococcus lugdunensis NOT DETECTED NOT DETECTED Final  ? Streptococcus species NOT DETECTED NOT DETECTED Final  ? Streptococcus agalactiae NOT DETECTED NOT DETECTED Final  ? Streptococcus pneumoniae NOT DETECTED NOT DETECTED Final  ? Streptococcus pyogenes NOT DETECTED NOT DETECTED Final  ? A.calcoaceticus-baumannii NOT DETECTED NOT DETECTED Final  ? Bacteroides fragilis NOT DETECTED NOT DETECTED Final  ? Enterobacterales NOT DETECTED NOT DETECTED Final  ? Enterobacter cloacae complex NOT DETECTED NOT DETECTED Final  ? Escherichia coli NOT DETECTED NOT DETECTED Final  ? Klebsiella aerogenes NOT DETECTED NOT DETECTED Final  ? Klebsiella oxytoca NOT DETECTED NOT DETECTED Final  ? Klebsiella pneumoniae NOT DETECTED NOT DETECTED Final  ? Proteus species NOT DETECTED NOT DETECTED Final  ? Salmonella species NOT DETECTED NOT DETECTED Final  ?  Serratia marcescens NOT DETECTED NOT DETECTED Final  ? Haemophilus influenzae NOT DETECTED NOT DETECTED Final  ? Neisseria meningitidis NOT DETECTED NOT DETECTED Final  ? Pseudomonas aeruginosa NOT DETECTED NOT DETECTED Final  ? Stenotrophomonas maltophilia NOT DETECTED NOT DETECTED Final  ? Candida albicans NOT DETECTED NOT DETECTED Final  ? Candida auris NOT DETECTED NOT DETECTED Final  ? Candida glabrata NOT DETECTED NOT DETECTED Final  ? Candida krusei NOT DETECTED NOT DETECTED Final  ? Candida parapsilosis NOT DETECTED NOT DETECTED Final  ? Candida tropicalis NOT DETECTED NOT DETECTED Final  ? Cryptococcus  neoformans/gattii NOT DETECTED NOT DETECTED Final  ? Methicillin resistance mecA/C DETECTED (A) NOT DETECTED Final  ?  Comment: CRITICAL RESULT CALLED TO, READ BACK BY AND VERIFIED WITH: ?PHARMD ABBIE E. [id number] FCP

## 2021-08-14 NOTE — Evaluation (Signed)
Occupational Therapy Evaluation ?Patient Details ?Name: NYIMA VANACKER ?MRN: [id number] ?DOB: [birth year] ?Today's Date: 08/14/2021 ? ? ?History of Present Illness SHAKENYA STONEBERG is a 51 y.o. female with a history of depression, hyperlipidemia, hypertension, compressive cervical myelomalacia, history of cholecystectomy.  Admitted due to fever and concern for sepsis with blood cultures positive with staphylococcus epidermidis.  ? ?Clinical Impression ?  ?Patient is a 51 year old female who lives at home with son and [facility] services. Patient was noted to have decreased functional activity tolerance, decreased safety awareness, decreased endurance, and increased pain impacting participation in ADLs. Patient will need 24/7 physical caregiver support at home to be successful. Patient reported that family was able to assist her at current assist level. Patient would continue to benefit from skilled OT services at this time while admitted and after d/c to address noted deficits in order to improve overall safety and independence in ADLs.  ? ?   ? ?Recommendations for follow up therapy are one component of a multi-disciplinary discharge planning process, led by the attending physician.  Recommendations may be updated based on patient status, additional functional criteria and insurance authorization.  ? ?Follow Up Recommendations ? Home health OT  ?  ?Assistance Recommended at Discharge Frequent or constant Supervision/Assistance  ?Patient can return home with the following A lot of help with bathing/dressing/bathroom;A lot of help with walking and/or transfers;Direct supervision/assist for financial management;Help with stairs or ramp for entrance;Assist for transportation;Direct supervision/assist for medications management;Assistance with cooking/housework ? ?  ?Functional Status Assessment ? Patient has had a recent decline in their functional status and demonstrates the ability to make significant improvements in function in a  reasonable and predictable amount of time.  ?Equipment Recommendations ? None recommended by OT (patient has needed items at home)  ?  ?Recommendations for Other Services   ? ? ?  ?Precautions / Restrictions Precautions ?Precautions: Fall ?Precaution Comments: R knee pain ?Restrictions ?Weight Bearing Restrictions: No  ? ?  ? ?Mobility Bed Mobility ?Overal bed mobility: Needs Assistance ?Bed Mobility: Rolling ?Rolling: Min assist ?  ?  ?  ?  ?General bed mobility comments: with use of bed rails with patient able to hold each side for about 1 min prior to fatigue ?  ? ?Transfers ?  ?  ?  ?  ?  ?  ?  ?  ?  ?  ?  ? ?  ?Balance   ?  ?  ?  ?  ?  ?  ?  ?  ?  ?  ?  ?  ?  ?  ?  ?  ?  ?  ?   ? ?ADL either performed or assessed with clinical judgement  ? ?ADL Overall ADL's : Needs assistance/impaired ?Eating/Feeding: Set up;Sitting ?  ?Grooming: Dance movement psychotherapist;Wash/dry hands;Set up;Sitting ?Grooming Details (indicate cue type and reason): chair position in bed. patient declined to transfer to EOB reporting it makes R knee hurt too much ?Upper Body Bathing: Minimal assistance;Bed level ?Upper Body Bathing Details (indicate cue type and reason): chair position in bed. ?Lower Body Bathing: Moderate assistance;Bed level ?  ?Upper Body Dressing : Bed level;Moderate assistance ?  ?Lower Body Dressing: Bed level;Maximal assistance ?  ?  ?Toilet Transfer Details (indicate cue type and reason): patient declined to attempt with reports of pain in R knee with movement. ?Toileting- Clothing Manipulation and Hygiene: Maximal assistance;Bed level ?Toileting - Clothing Manipulation Details (indicate cue type and reason): patient was max A for hygiene  tasks with body habitus. patient was noted to participate in rolling with min A needed to achieve sidelying. ?  ?  ?  ?   ? ? ? ?Vision Patient Visual Report: No change from baseline ?   ?   ?Perception   ?  ?Praxis   ?  ? ?Pertinent Vitals/Pain    ? ? ? ?Hand Dominance Right ?  ?Extremity/Trunk  Assessment Upper Extremity Assessment ?Upper Extremity Assessment: Overall WFL for tasks assessed ?  ?Lower Extremity Assessment ?Lower Extremity Assessment: Defer to PT evaluation ?  ?Cervical / Trunk Assessment ?Cervical / Trunk Assessment: Normal ?  ?Communication Communication ?Communication: No difficulties ?  ?Cognition Arousal/Alertness: Awake/alert ?Behavior During Therapy: [facility] for tasks assessed/performed ?Overall Cognitive Status: Within Functional Limits for tasks assessed ?  ?  ?  ?  ?  ?  ?  ?  ?  ?  ?  ?  ?  ?  ?  ?  ?  ?  ?  ?General Comments    ? ?  ?Exercises   ?  ?Shoulder Instructions    ? ? ?Home Living Family/patient expects to be discharged to:: Private residence ?Living Arrangements: Children;Other relatives ?Available Help at Discharge: Family ?Type of Home: House ?Home Access: Level entry ?  ?  ?Home Layout: Two level;Able to live on main level with bedroom/bathroom ?  ?  ?Bathroom Shower/Tub: Gaffer;Tub/shower unit ?  ?Bathroom Toilet: Standard ?  ?  ?Home Equipment: Rollator (4 wheels);Shower seat;Hospital bed;Wheelchair - manual ?  ?Additional Comments: slide board ?  ? ?  ?Prior Functioning/Environment Prior Level of Function : Needs assist ?  ?  ?  ?Physical Assist : Mobility (physical);ADLs (physical) ?Mobility (physical): Bed mobility;Transfers;Gait ?ADLs (physical): Toileting;IADLs;Bathing ?  ?ADLs Comments: patients son was assisting with bathing/dressing tasks for LB and UB assist her.patients son was assisting with hygiene after toileting and transfers . patient reported OT was present 1x a week and PT 2x a week. ?  ? ?  ?  ?OT Problem List: Decreased activity tolerance;Decreased knowledge of use of DME or AE;Decreased safety awareness;Decreased knowledge of precautions;Pain;Impaired balance (sitting and/or standing) ?  ?   ?OT Treatment/Interventions: Self-care/ADL training;Therapeutic exercise;Neuromuscular education;Energy conservation;DME and/or AE  instruction;Therapeutic activities;Balance training;Patient/family education  ?  ?OT Goals(Current goals can be found in the care plan section) Acute Rehab OT Goals ?Patient Stated Goal: to go back home ?OT Goal Formulation: With patient ?Time For Goal Achievement: 08/28/21 ?Potential to Achieve Goals: Fair  ?OT Frequency: Min 2X/week ?  ? ?Co-evaluation   ?  ?  ?  ?  ? ?  ?AM-PAC OT "6 Clicks" Daily Activity     ?Outcome Measure Help from another person eating meals?: A Little ?Help from another person taking care of personal grooming?: A Little ?Help from another person toileting, which includes using toliet, bedpan, or urinal?: Total ?Help from another person bathing (including washing, rinsing, drying)?: A Lot ?Help from another person to put on and taking off regular upper body clothing?: A Little ?Help from another person to put on and taking off regular lower body clothing?: Total ?6 Click Score: 13 ?  ?End of Session Nurse Communication: Other (comment) (ok to participate and updated after session) ? ?Activity Tolerance: Patient tolerated treatment well ?Patient left: in bed;with call bell/phone within reach;with bed alarm set ? ?OT Visit Diagnosis: Unsteadiness on feet (R26.81)  ?              ?Time: 5732-2025 ?OT Time  Calculation (min): 36 min ?Charges:  OT General Charges ?$OT Visit: 1 Visit ?OT Evaluation ?$OT Eval Moderate Complexity: 1 Mod ?OT Treatments ?$Self Care/Home Management : 8-22 mins ? ?Kizzi Overbey OTR/L, MS ?Acute Rehabilitation Department ?Office# [phone] ?Pager# [phone] ? ? ?[place] ?08/14/2021, 1:17 PM ?

## 2021-08-15 DIAGNOSIS — R509 Fever, unspecified: Secondary | ICD-10-CM | POA: Diagnosis not present

## 2021-08-15 DIAGNOSIS — R7881 Bacteremia: Secondary | ICD-10-CM | POA: Diagnosis not present

## 2021-08-15 DIAGNOSIS — F32A Depression, unspecified: Secondary | ICD-10-CM | POA: Diagnosis not present

## 2021-08-15 LAB — COMPREHENSIVE METABOLIC PANEL
ALT: 41 U/L (ref 0–44)
AST: 34 U/L (ref 15–41)
Albumin: 3.1 g/dL — ABNORMAL LOW (ref 3.5–5.0)
Alkaline Phosphatase: 241 U/L — ABNORMAL HIGH (ref 38–126)
Anion gap: 10 (ref 5–15)
BUN: 13 mg/dL (ref 6–20)
CO2: 24 mmol/L (ref 22–32)
Calcium: 8.8 mg/dL — ABNORMAL LOW (ref 8.9–10.3)
Chloride: 102 mmol/L (ref 98–111)
Creatinine, Ser: 0.48 mg/dL (ref 0.44–1.00)
GFR, Estimated: >60 mL/min (ref >60)
Glucose, Bld: 165 mg/dL — ABNORMAL HIGH (ref 70–99)
Potassium: 3.1 mmol/L — ABNORMAL LOW (ref 3.5–5.1)
Sodium: 136 mmol/L (ref 135–145)
Total Bilirubin: 0.8 mg/dL (ref 0.3–1.2)
Total Protein: 6.5 g/dL (ref 6.5–8.1)

## 2021-08-15 LAB — CULTURE, BLOOD (ROUTINE X 2)
Special Requests: ADEQUATE
Special Requests: ADEQUATE

## 2021-08-15 LAB — GAMMA GT: GGT: 306 U/L — ABNORMAL HIGH (ref 7–50)

## 2021-08-15 MED ORDER — POTASSIUM CHLORIDE CRYS ER 20 MEQ PO TBCR
40.0000 meq | EXTENDED_RELEASE_TABLET | ORAL | Status: AC
  Administered 2021-08-15 (×2): 40 meq via ORAL
  Filled 2021-08-15 (×2): qty 2

## 2021-08-15 NOTE — Plan of Care (Signed)
PT aox4, pleasant and cooperative with staff.  ?Blood cultures pending. ID following.  ? ?Problem: Education: ?Goal: Knowledge of General Education information will improve ?Description: Including pain rating scale, medication(s)/side effects and non-pharmacologic comfort measures ?Outcome: Progressing ?  ?Problem: Health Behavior/Discharge Planning: ?Goal: Ability to manage health-related needs will improve ?Outcome: Progressing ?  ?Problem: Clinical Measurements: ?Goal: Ability to maintain clinical measurements within normal limits will improve ?Outcome: Progressing ?Goal: Will remain free from infection ?Outcome: Progressing ?Goal: Diagnostic test results will improve ?Outcome: Progressing ?Goal: Respiratory complications will improve ?Outcome: Progressing ?Goal: Cardiovascular complication will be avoided ?Outcome: Progressing ?  ?Problem: Activity: ?Goal: Risk for activity intolerance will decrease ?Outcome: Progressing ?  ?Problem: Nutrition: ?Goal: Adequate nutrition will be maintained ?Outcome: Progressing ?  ?Problem: Coping: ?Goal: Level of anxiety will decrease ?Outcome: Progressing ?  ?Problem: Elimination: ?Goal: Will not experience complications related to bowel motility ?Outcome: Progressing ?Goal: Will not experience complications related to urinary retention ?Outcome: Progressing ?  ?Problem: Pain Managment: ?Goal: General experience of comfort will improve ?Outcome: Progressing ?  ?Problem: Safety: ?Goal: Ability to remain free from injury will improve ?Outcome: Progressing ?  ?Problem: Skin Integrity: ?Goal: Risk for impaired skin integrity will decrease ?Outcome: Progressing ?  ?

## 2021-08-15 NOTE — Plan of Care (Signed)
Pt for discharge home this afternoon.  ?AVS printed and reviewed with patient at bedside.  ?Follow up and medication changes discussed.  ?Verbalized understanding.  ?IV and telemetry removed. ?PTAR to transport home.  ? ?Problem: Education: ?Goal: Knowledge of General Education information will improve ?Description: Including pain rating scale, medication(s)/side effects and non-pharmacologic comfort measures ?08/15/2021 1414 by Rutherford Guys, RN ?Outcome: Adequate for Discharge ?08/15/2021 1156 by Rutherford Guys, RN ?Outcome: Progressing ?  ?Problem: Health Behavior/Discharge Planning: ?Goal: Ability to manage health-related needs will improve ?08/15/2021 1414 by Rutherford Guys, RN ?Outcome: Adequate for Discharge ?08/15/2021 1156 by Rutherford Guys, RN ?Outcome: Progressing ?  ?Problem: Clinical Measurements: ?Goal: Ability to maintain clinical measurements within normal limits will improve ?08/15/2021 1414 by Rutherford Guys, RN ?Outcome: Adequate for Discharge ?08/15/2021 1156 by Rutherford Guys, RN ?Outcome: Progressing ?Goal: Will remain free from infection ?08/15/2021 1414 by Rutherford Guys, RN ?Outcome: Adequate for Discharge ?08/15/2021 1156 by Rutherford Guys, RN ?Outcome: Progressing ?Goal: Diagnostic test results will improve ?08/15/2021 1414 by Rutherford Guys, RN ?Outcome: Adequate for Discharge ?08/15/2021 1156 by Rutherford Guys, RN ?Outcome: Progressing ?Goal: Respiratory complications will improve ?08/15/2021 1414 by Rutherford Guys, RN ?Outcome: Adequate for Discharge ?08/15/2021 1156 by Rutherford Guys, RN ?Outcome: Progressing ?Goal: Cardiovascular complication will be avoided ?08/15/2021 1414 by Rutherford Guys, RN ?Outcome: Adequate for Discharge ?08/15/2021 1156 by Rutherford Guys, RN ?Outcome: Progressing ?  ?Problem: Activity: ?Goal: Risk for activity intolerance will decrease ?08/15/2021 1414 by Rutherford Guys, RN ?Outcome: Adequate for Discharge ?08/15/2021 1156 by Rutherford Guys,  RN ?Outcome: Progressing ?  ?Problem: Nutrition: ?Goal: Adequate nutrition will be maintained ?08/15/2021 1414 by Rutherford Guys, RN ?Outcome: Adequate for Discharge ?08/15/2021 1156 by Rutherford Guys, RN ?Outcome: Progressing ?  ?Problem: Coping: ?Goal: Level of anxiety will decrease ?08/15/2021 1414 by Rutherford Guys, RN ?Outcome: Adequate for Discharge ?08/15/2021 1156 by Rutherford Guys, RN ?Outcome: Progressing ?  ?Problem: Elimination: ?Goal: Will not experience complications related to bowel motility ?08/15/2021 1414 by Rutherford Guys, RN ?Outcome: Adequate for Discharge ?08/15/2021 1156 by Rutherford Guys, RN ?Outcome: Progressing ?Goal: Will not experience complications related to urinary retention ?08/15/2021 1414 by Rutherford Guys, RN ?Outcome: Adequate for Discharge ?08/15/2021 1156 by Rutherford Guys, RN ?Outcome: Progressing ?  ?Problem: Pain Managment: ?Goal: General experience of comfort will improve ?08/15/2021 1414 by Rutherford Guys, RN ?Outcome: Adequate for Discharge ?08/15/2021 1156 by Rutherford Guys, RN ?Outcome: Progressing ?  ?Problem: Safety: ?Goal: Ability to remain free from injury will improve ?08/15/2021 1414 by Rutherford Guys, RN ?Outcome: Adequate for Discharge ?08/15/2021 1156 by Rutherford Guys, RN ?Outcome: Progressing ?  ?Problem: Skin Integrity: ?Goal: Risk for impaired skin integrity will decrease ?08/15/2021 1414 by Rutherford Guys, RN ?Outcome: Adequate for Discharge ?08/15/2021 1156 by Rutherford Guys, RN ?Outcome: Progressing ?  ?

## 2021-08-15 NOTE — Discharge Summary (Signed)
?Physician Discharge Summary ?  ?Patient: [name] MRN: [id number] DOB: [birth year]  ?Admit date:     08/12/2021  ?Discharge date: 08/15/21  ?Discharge Physician: Cordelia Poche, MD  ? ?PCP: [name], MD  ? ?Recommendations at discharge:  ? ?PCP follow-up ?Consider MRCP for evaluation of elevated alkaline phosphatase ?Repeat CMP ? ?Discharge Diagnoses: ?Principal Problem: ?  Fever of unknown origin ?Active Problems: ?  Compressive cervical cord myelomalacia (HCC) ?  Hyperlipidemia ?  Obesity, Class III, BMI 40-49.9 (morbid obesity) ([place]) ?  Anxiety and depression ?  Essential hypertension ?  Fibromyalgia ?  PNA (pneumonia) ?  Positive blood cultures ? ?Resolved Problems: ?  * No resolved hospital problems. * ? ?Hospital Course: ?BABETTA Macias is a 51 y.o. female with a history of depression, hyperlipidemia, hypertension, compressive cervical myelomalacia, history of cholecystectomy. Patient presented secondary to fever noticed by her home health nurse in addition to hypotension. In the ED, patient received empiric Levaquin in addition to an IV fluid bolus. Blood cultures obtained on admission grew staphylococcus epidermidis, seen on BCID. Empiric vancomycin initiated and one dose given. Final cultures significant for staphylococcus epidermidis and aerococcus viridans. Repeat blood cultures, obtained prior to vancomycin, are no growth. ? ?Assessment and Plan: ? ?Fevers ?Positive blood culture ?Fever initially with unknown etiology. Blood cultures (3/30) resulted positive with staphylococcus epidermidis on BCID. History also significant for upper respiratory symptoms (sneezing, rhinorrhea) although this may be related to seasonal allergies; RVP negative. ID consulted on 3/31 and recommended repeat blood cultures (3/31). One dose of vancomycin IV given on 3/31 after cultures were obtained. Repeat blood cultures with no growth.  ?  ?Hypotension ?Mild and resolved with IV fluid bolus in the ED. ?   ?Hypocalcemia ?Mild and has resolved once adjusted for albumin. ?  ?Hyponatremia ?Sodium of 126 on admission. Mostly resolved with IV fluids. ?  ?Compressive cervical myelomalacia ?Impaired mobility ?Patient follows with neurosurgery. Patient currently working with home health therapy. PT/OT recommending home health. ?  ?Anxiety ?Depression ?Continue Cymbalta, Klonopin, Wellbutrin XL, Xanax ?  ?Hyperlipidemia ?Crestor held secondary to elevated AST/ALT ?  ?Elevated AST/ALT ?Elevated bilirubin ?Unsure of etiology. History of cholecystectomy with stable biliary ductal dilation on RUQ ultrasound. Hepatitis panel negative. Recently on Macrobid. Also with possible viral illness, which may have contributed to AST/ALT. AST/ALT/bilirubin trending down and patient without abdominal symptoms. Alkaline phosphatase is elevated. Elevated ggt. Chronic component and asymptomatic. Discussed with GI who recommended MRCP and/or outpatient follow-up. Patient preferred discharge and outpatient follow-up. Return precautions discussed. ? ? ?  ? ? ?Consultants: Infectious disease ?Procedures performed: None  ?Disposition: Home ?Diet recommendation:  ?Regular diet ?DISCHARGE MEDICATION: ?Allergies as of 08/15/2021   ? ?   Reactions  ? Niacin Anaphylaxis, Shortness Of Breath, Swelling  ? Swelling and "problems breathing"  ? Sulfamethoxazole-trimethoprim Anaphylaxis, Swelling, Rash, Other (See Comments)  ? Throat closed and the eyes became swollen  ? Aspirin Hives  ? Bactrim Hives  ? Benzoin Compound Rash  ? Cephalexin Hives  ? Tolerated ceftriaxone and cefepime 07/2019  ? Clindamycin/lincomycin Hives  ? Iohexol Hives  ? Pt treated with PO benedryl  ? Lisinopril Hives  ? Naproxen Hives  ? Pnu-imune [pneumococcal Polysaccharide Vaccine] Rash  ? Sulfonamide Derivatives Rash  ? ?  ? ?  ?Medication List  ?  ? ?STOP taking these medications   ? ?nitrofurantoin (macrocrystal-monohydrate) 100 MG capsule ?Commonly known as: Macrobid ?  ? ?  ? ?TAKE  these medications   ? ?  ALPRAZolam 0.5 MG tablet ?Commonly known as: Duanne Moron ?Take 1 tablet (0.5 mg total) by mouth 2 (two) times daily. ?What changed:  ?when to take this ?additional instructions ?  ?ARIPiprazole 10 MG tablet ?Commonly known as: ABILIFY ?Take 10 mg by mouth in the morning. ?  ?Armodafinil 250 MG tablet ?Take 250 mg by mouth in the morning and at bedtime. ?  ?Biotin 10 MG Tabs ?Take 10 mg by mouth in the morning. ?  ?buPROPion 300 MG 24 hr tablet ?Commonly known as: WELLBUTRIN XL ?TAKE 1 TABLET BY MOUTH EVERY DAY ?  ?calcium citrate 950 (200 Ca) MG tablet ?Commonly known as: CALCITRATE - dosed in mg elemental calcium ?Take 1 tablet by mouth daily. ?  ?clonazePAM 1 MG tablet ?Commonly known as: KLONOPIN ?TAKE 1 TABLET BY MOUTH AT BEDTIME ?  ?cyclobenzaprine 10 MG tablet ?Commonly known as: FLEXERIL ?Take 2 tablets (20 mg total) by mouth 2 (two) times daily. ?  ?diclofenac Sodium 1 % Gel ?Commonly known as: VOLTAREN ?Apply 4 g topically 4 (four) times daily. ?  ?diphenhydrAMINE 25 MG tablet ?Commonly known as: BENADRYL ?Take 25 mg by mouth 2 (two) times daily as needed for allergies. ?  ?DULoxetine 60 MG capsule ?Commonly known as: CYMBALTA ?Take 2 capsules (120 mg total) by mouth daily. ?What changed:  ?how much to take ?when to take this ?  ?FeroSul 325 (65 FE) MG tablet ?Generic drug: ferrous sulfate ?TAKE 1 TABLET BY MOUTH EVERY DAY WITH BREAKFAST ?What changed: See the new instructions. ?  ?folic acid 1 MG tablet ?Commonly known as: FOLVITE ?Take 1 tablet (1 mg total) by mouth daily. ?  ?furosemide 40 MG tablet ?Commonly known as: LASIX ?TAKE 1 TABLET BY MOUTH EVERY DAY MAY take additional TABLET IF weight INCREASE by 5lb AND call MD FOR further instructions ?What changed: See the new instructions. ?  ?gabapentin 600 MG tablet ?Commonly known as: NEURONTIN ?Take 1 tablet (600 mg total) by mouth 3 (three) times daily. TAKE 2 TABLETS BY MOUTH 3 TIMES DAILY ?What changed: additional instructions ?   ?HYDROcodone-acetaminophen 10-325 MG tablet ?Commonly known as: NORCO ?TAKE 1 TABLET BY MOUTH EVERY 6 HOURS AS NEEDED ?What changed: reasons to take this ?  ?nebivolol 10 MG tablet ?Commonly known as: BYSTOLIC ?TAKE 1 TABLET BY MOUTH EVERY DAY ?  ?nystatin powder ?Commonly known as: MYCOSTATIN/NYSTOP ?APPLY 1 APPLICATION TOPICALLY 3 TIMES DAILY ?What changed: See the new instructions. ?  ?Oxcarbazepine 300 MG tablet ?Commonly known as: TRILEPTAL ?Take 300 mg by mouth 2 (two) times daily. ?  ?pantoprazole 40 MG tablet ?Commonly known as: PROTONIX ?TAKE 1 TABLET BY MOUTH 2 TIMES DAILY ?  ?phentermine 37.5 MG capsule ?TAKE 1 CAPSULE BY MOUTH EVERY MORNING ?What changed: when to take this ?  ?promethazine 25 MG tablet ?Commonly known as: PHENERGAN ?TAKE 1 TABLET BY MOUTH EVERY 6 HOURS AS NEEDED FOR NAUSEA AND vomiting ?What changed: See the new instructions. ?  ?pyridoxine 100 MG tablet ?Commonly known as: B-6 ?Take 1 tablet (100 mg total) by mouth daily. ?  ?rosuvastatin 20 MG tablet ?Commonly known as: CRESTOR ?TAKE 1 TABLET BY MOUTH EVERY DAY ?  ?senna-docusate 8.6-50 MG tablet ?Commonly known as: Senokot-S ?Take 1 tablet by mouth at bedtime. ?What changed:  ?when to take this ?reasons to take this ?  ?Skyrizi 150 MG/ML Sosy ?Generic drug: Risankizumab-rzaa ?Inject 150 mg into the skin every 3 (three) months. ?  ?thiamine 100 MG tablet ?Take 1 tablet (100 mg total) by  mouth daily. ?  ?vitamin B-12 1000 MCG tablet ?Commonly known as: CYANOCOBALAMIN ?Take 1 tablet (1,000 mcg total) by mouth daily. ?  ? ?  ? ? Follow-up Information   ? ? [name], MD. Schedule an appointment as soon as possible for a visit in 1 week(s).   ?Specialty: Family Medicine ?Why: For hospital follow-up ?Contact information: ?[address] ?[place] [address] ?[phone] ? ? ?  ?  ? ?  ?  ? ?  ? ?Discharge Exam: ? ?BP 120/73 (BP Location: Right Arm)   Pulse 77   Temp 97.6 ?F (36.4 ?C) (Oral)   Resp 18   SpO2 99%   ? ?General exam: Appears calm and comfortable ? ?Condition at discharge: stable ? ?The results of significant diagnostics from this hospitalization (including imaging, microbiology, ancillary and laboratory) are listed below for re

## 2021-08-15 NOTE — TOC Transition Note (Signed)
Transition of Care (TOC) - CM/SW Discharge Note ? ? ?Patient Details  ?Name: [name] ?MRN: [id number] ?Date of Birth: [birth year] ? ?Transition of Care (TOC) CM/SW Contact:  ?Ross Ludwig, LCSW ?Phone Number: ?08/15/2021, 2:57 PM ? ? ?Clinical Narrative:    ? ?CSW was informed that patient will need PTAR transport home.  CSW spoke to patient and confirmed that she will need EMS transport home.  Patient is currently open to [facility], CSW updated [facility] that patient is discharging back home today.  Patient is under observation, and does not need new orders per Corene Cornea at [facility].  CSW scheduled EMS transport, and signing off, please reconsult with other TOC needs. ? ? ?Final next level of care: Home/Self Care ?Barriers to Discharge: Barriers Resolved ? ? ?Patient Goals and CMS Choice ?Patient states their goals for this hospitalization and ongoing recovery are:: To return back home. ?  ?  ? ?Discharge Placement ?  ?           ?  ?  ?  ?  ? ?Discharge Plan and Services ?  ?  ?           ?  ?  ?  ?  ?  ?HH Arranged: PT, OT, RN ?[place] Agency: [facility] ([place]) ?Date HH Agency Contacted: 08/15/21 ?Time [place]: 2671 ?Representative spoke with at [facility]: Corene Cornea ? ?Social Determinants of Health (SDOH) Interventions ?  ? ? ?Readmission Risk Interventions ?   ? View : No data to display.  ?  ?  ?  ? ? ? ? ? ?

## 2021-08-15 NOTE — Discharge Instructions (Signed)
[name], ? ?You were in the hospital with fever of unknown reason. You had positive blood cultures but they were negative once repeated. The infectious disease doctor does not think this is a true infection but rather a false positive. You also had some liver test abnormalities which have mostly improved. GI has recommended an MRCP but this can be considered as an outpatient since you have no symptoms. Please return if you develop recurring fever, yellowing of your eyes or skin, worsening abdominal pain. ?

## 2021-08-16 ENCOUNTER — Telehealth: Payer: Self-pay

## 2021-08-16 DIAGNOSIS — G9529 Other cord compression: Secondary | ICD-10-CM | POA: Diagnosis not present

## 2021-08-16 DIAGNOSIS — M300 Polyarteritis nodosa: Secondary | ICD-10-CM

## 2021-08-16 DIAGNOSIS — R238 Other skin changes: Secondary | ICD-10-CM | POA: Diagnosis not present

## 2021-08-16 DIAGNOSIS — Z993 Dependence on wheelchair: Secondary | ICD-10-CM

## 2021-08-16 DIAGNOSIS — E559 Vitamin D deficiency, unspecified: Secondary | ICD-10-CM

## 2021-08-16 DIAGNOSIS — F419 Anxiety disorder, unspecified: Secondary | ICD-10-CM

## 2021-08-16 DIAGNOSIS — F32A Depression, unspecified: Secondary | ICD-10-CM

## 2021-08-16 DIAGNOSIS — G4733 Obstructive sleep apnea (adult) (pediatric): Secondary | ICD-10-CM

## 2021-08-16 DIAGNOSIS — N390 Urinary tract infection, site not specified: Secondary | ICD-10-CM | POA: Diagnosis not present

## 2021-08-16 DIAGNOSIS — K219 Gastro-esophageal reflux disease without esophagitis: Secondary | ICD-10-CM

## 2021-08-16 DIAGNOSIS — M797 Fibromyalgia: Secondary | ICD-10-CM | POA: Diagnosis not present

## 2021-08-16 DIAGNOSIS — E785 Hyperlipidemia, unspecified: Secondary | ICD-10-CM

## 2021-08-16 DIAGNOSIS — M5136 Other intervertebral disc degeneration, lumbar region: Secondary | ICD-10-CM | POA: Diagnosis not present

## 2021-08-16 DIAGNOSIS — I872 Venous insufficiency (chronic) (peripheral): Secondary | ICD-10-CM | POA: Diagnosis not present

## 2021-08-16 DIAGNOSIS — G43909 Migraine, unspecified, not intractable, without status migrainosus: Secondary | ICD-10-CM

## 2021-08-16 DIAGNOSIS — K760 Fatty (change of) liver, not elsewhere classified: Secondary | ICD-10-CM

## 2021-08-16 DIAGNOSIS — G629 Polyneuropathy, unspecified: Secondary | ICD-10-CM | POA: Diagnosis not present

## 2021-08-16 DIAGNOSIS — K259 Gastric ulcer, unspecified as acute or chronic, without hemorrhage or perforation: Secondary | ICD-10-CM

## 2021-08-16 DIAGNOSIS — M4802 Spinal stenosis, cervical region: Secondary | ICD-10-CM | POA: Diagnosis not present

## 2021-08-16 DIAGNOSIS — R350 Frequency of micturition: Secondary | ICD-10-CM

## 2021-08-16 DIAGNOSIS — E538 Deficiency of other specified B group vitamins: Secondary | ICD-10-CM

## 2021-08-16 DIAGNOSIS — L989 Disorder of the skin and subcutaneous tissue, unspecified: Secondary | ICD-10-CM | POA: Diagnosis not present

## 2021-08-16 DIAGNOSIS — G9589 Other specified diseases of spinal cord: Secondary | ICD-10-CM | POA: Diagnosis not present

## 2021-08-16 DIAGNOSIS — Z9181 History of falling: Secondary | ICD-10-CM

## 2021-08-16 DIAGNOSIS — B9620 Unspecified Escherichia coli [E. coli] as the cause of diseases classified elsewhere: Secondary | ICD-10-CM | POA: Diagnosis not present

## 2021-08-16 DIAGNOSIS — I10 Essential (primary) hypertension: Secondary | ICD-10-CM | POA: Diagnosis not present

## 2021-08-16 NOTE — Progress Notes (Signed)
Note apparently created in error ?

## 2021-08-16 NOTE — Telephone Encounter (Signed)
Home Health Certification or Plan of Care Tracking ? ?Certification and Plan of Care ? ?[place] Agency: [facility] ? ?Order Number:  [id number] ? ?Has charge sheet been attached? yes ? ?Where has form been placed:  folder up front ?

## 2021-08-17 DIAGNOSIS — R238 Other skin changes: Secondary | ICD-10-CM | POA: Diagnosis not present

## 2021-08-17 DIAGNOSIS — K760 Fatty (change of) liver, not elsewhere classified: Secondary | ICD-10-CM | POA: Diagnosis not present

## 2021-08-17 DIAGNOSIS — K259 Gastric ulcer, unspecified as acute or chronic, without hemorrhage or perforation: Secondary | ICD-10-CM | POA: Diagnosis not present

## 2021-08-17 DIAGNOSIS — L989 Disorder of the skin and subcutaneous tissue, unspecified: Secondary | ICD-10-CM | POA: Diagnosis not present

## 2021-08-17 DIAGNOSIS — R350 Frequency of micturition: Secondary | ICD-10-CM | POA: Diagnosis not present

## 2021-08-17 DIAGNOSIS — N390 Urinary tract infection, site not specified: Secondary | ICD-10-CM | POA: Diagnosis not present

## 2021-08-17 DIAGNOSIS — G4733 Obstructive sleep apnea (adult) (pediatric): Secondary | ICD-10-CM | POA: Diagnosis not present

## 2021-08-17 DIAGNOSIS — G629 Polyneuropathy, unspecified: Secondary | ICD-10-CM | POA: Diagnosis not present

## 2021-08-17 DIAGNOSIS — E538 Deficiency of other specified B group vitamins: Secondary | ICD-10-CM | POA: Diagnosis not present

## 2021-08-17 DIAGNOSIS — G9529 Other cord compression: Secondary | ICD-10-CM | POA: Diagnosis not present

## 2021-08-17 DIAGNOSIS — G9589 Other specified diseases of spinal cord: Secondary | ICD-10-CM | POA: Diagnosis not present

## 2021-08-17 DIAGNOSIS — K219 Gastro-esophageal reflux disease without esophagitis: Secondary | ICD-10-CM | POA: Diagnosis not present

## 2021-08-17 DIAGNOSIS — M797 Fibromyalgia: Secondary | ICD-10-CM | POA: Diagnosis not present

## 2021-08-17 DIAGNOSIS — F32A Depression, unspecified: Secondary | ICD-10-CM | POA: Diagnosis not present

## 2021-08-17 DIAGNOSIS — M5136 Other intervertebral disc degeneration, lumbar region: Secondary | ICD-10-CM | POA: Diagnosis not present

## 2021-08-17 DIAGNOSIS — E785 Hyperlipidemia, unspecified: Secondary | ICD-10-CM | POA: Diagnosis not present

## 2021-08-17 DIAGNOSIS — I10 Essential (primary) hypertension: Secondary | ICD-10-CM | POA: Diagnosis not present

## 2021-08-17 DIAGNOSIS — M4802 Spinal stenosis, cervical region: Secondary | ICD-10-CM | POA: Diagnosis not present

## 2021-08-17 DIAGNOSIS — G43909 Migraine, unspecified, not intractable, without status migrainosus: Secondary | ICD-10-CM | POA: Diagnosis not present

## 2021-08-17 DIAGNOSIS — M300 Polyarteritis nodosa: Secondary | ICD-10-CM | POA: Diagnosis not present

## 2021-08-17 DIAGNOSIS — E559 Vitamin D deficiency, unspecified: Secondary | ICD-10-CM | POA: Diagnosis not present

## 2021-08-17 DIAGNOSIS — I872 Venous insufficiency (chronic) (peripheral): Secondary | ICD-10-CM | POA: Diagnosis not present

## 2021-08-18 ENCOUNTER — Other Ambulatory Visit: Payer: Self-pay | Admitting: Family Medicine

## 2021-08-18 DIAGNOSIS — K259 Gastric ulcer, unspecified as acute or chronic, without hemorrhage or perforation: Secondary | ICD-10-CM | POA: Diagnosis not present

## 2021-08-18 DIAGNOSIS — M300 Polyarteritis nodosa: Secondary | ICD-10-CM | POA: Diagnosis not present

## 2021-08-18 DIAGNOSIS — G629 Polyneuropathy, unspecified: Secondary | ICD-10-CM | POA: Diagnosis not present

## 2021-08-18 DIAGNOSIS — E785 Hyperlipidemia, unspecified: Secondary | ICD-10-CM | POA: Diagnosis not present

## 2021-08-18 DIAGNOSIS — K219 Gastro-esophageal reflux disease without esophagitis: Secondary | ICD-10-CM | POA: Diagnosis not present

## 2021-08-18 DIAGNOSIS — M5136 Other intervertebral disc degeneration, lumbar region: Secondary | ICD-10-CM | POA: Diagnosis not present

## 2021-08-18 DIAGNOSIS — M797 Fibromyalgia: Secondary | ICD-10-CM | POA: Diagnosis not present

## 2021-08-18 DIAGNOSIS — R238 Other skin changes: Secondary | ICD-10-CM | POA: Diagnosis not present

## 2021-08-18 DIAGNOSIS — N390 Urinary tract infection, site not specified: Secondary | ICD-10-CM | POA: Diagnosis not present

## 2021-08-18 DIAGNOSIS — G9589 Other specified diseases of spinal cord: Secondary | ICD-10-CM | POA: Diagnosis not present

## 2021-08-18 DIAGNOSIS — I10 Essential (primary) hypertension: Secondary | ICD-10-CM | POA: Diagnosis not present

## 2021-08-18 DIAGNOSIS — M4802 Spinal stenosis, cervical region: Secondary | ICD-10-CM | POA: Diagnosis not present

## 2021-08-18 DIAGNOSIS — F32A Depression, unspecified: Secondary | ICD-10-CM | POA: Diagnosis not present

## 2021-08-18 DIAGNOSIS — I872 Venous insufficiency (chronic) (peripheral): Secondary | ICD-10-CM | POA: Diagnosis not present

## 2021-08-18 DIAGNOSIS — G43909 Migraine, unspecified, not intractable, without status migrainosus: Secondary | ICD-10-CM | POA: Diagnosis not present

## 2021-08-18 DIAGNOSIS — E559 Vitamin D deficiency, unspecified: Secondary | ICD-10-CM | POA: Diagnosis not present

## 2021-08-18 DIAGNOSIS — L989 Disorder of the skin and subcutaneous tissue, unspecified: Secondary | ICD-10-CM | POA: Diagnosis not present

## 2021-08-18 DIAGNOSIS — G4733 Obstructive sleep apnea (adult) (pediatric): Secondary | ICD-10-CM | POA: Diagnosis not present

## 2021-08-18 DIAGNOSIS — G9529 Other cord compression: Secondary | ICD-10-CM | POA: Diagnosis not present

## 2021-08-18 DIAGNOSIS — E538 Deficiency of other specified B group vitamins: Secondary | ICD-10-CM | POA: Diagnosis not present

## 2021-08-18 DIAGNOSIS — K760 Fatty (change of) liver, not elsewhere classified: Secondary | ICD-10-CM | POA: Diagnosis not present

## 2021-08-18 DIAGNOSIS — R350 Frequency of micturition: Secondary | ICD-10-CM | POA: Diagnosis not present

## 2021-08-18 LAB — CULTURE, BLOOD (ROUTINE X 2)
Culture: NO GROWTH
Culture: NO GROWTH
Special Requests: ADEQUATE

## 2021-08-19 ENCOUNTER — Telehealth: Payer: Self-pay

## 2021-08-19 NOTE — Telephone Encounter (Signed)
Patient called in stating that she was admitted to the hospital last weekend and is wanting to schedule a follow up but it has to be virtually as she is unable to come in the office. Please advise if okay to do HSP follow up virtually.  ?

## 2021-08-23 DIAGNOSIS — L989 Disorder of the skin and subcutaneous tissue, unspecified: Secondary | ICD-10-CM | POA: Diagnosis not present

## 2021-08-23 DIAGNOSIS — M300 Polyarteritis nodosa: Secondary | ICD-10-CM | POA: Diagnosis not present

## 2021-08-23 DIAGNOSIS — M4802 Spinal stenosis, cervical region: Secondary | ICD-10-CM | POA: Diagnosis not present

## 2021-08-23 DIAGNOSIS — G9589 Other specified diseases of spinal cord: Secondary | ICD-10-CM | POA: Diagnosis not present

## 2021-08-23 DIAGNOSIS — E538 Deficiency of other specified B group vitamins: Secondary | ICD-10-CM | POA: Diagnosis not present

## 2021-08-23 DIAGNOSIS — M5136 Other intervertebral disc degeneration, lumbar region: Secondary | ICD-10-CM | POA: Diagnosis not present

## 2021-08-23 DIAGNOSIS — K219 Gastro-esophageal reflux disease without esophagitis: Secondary | ICD-10-CM | POA: Diagnosis not present

## 2021-08-23 DIAGNOSIS — N390 Urinary tract infection, site not specified: Secondary | ICD-10-CM | POA: Diagnosis not present

## 2021-08-23 DIAGNOSIS — R238 Other skin changes: Secondary | ICD-10-CM | POA: Diagnosis not present

## 2021-08-23 DIAGNOSIS — K760 Fatty (change of) liver, not elsewhere classified: Secondary | ICD-10-CM | POA: Diagnosis not present

## 2021-08-23 DIAGNOSIS — R350 Frequency of micturition: Secondary | ICD-10-CM | POA: Diagnosis not present

## 2021-08-23 DIAGNOSIS — E559 Vitamin D deficiency, unspecified: Secondary | ICD-10-CM | POA: Diagnosis not present

## 2021-08-23 DIAGNOSIS — M797 Fibromyalgia: Secondary | ICD-10-CM | POA: Diagnosis not present

## 2021-08-23 DIAGNOSIS — G9529 Other cord compression: Secondary | ICD-10-CM | POA: Diagnosis not present

## 2021-08-23 DIAGNOSIS — G43909 Migraine, unspecified, not intractable, without status migrainosus: Secondary | ICD-10-CM | POA: Diagnosis not present

## 2021-08-23 DIAGNOSIS — E785 Hyperlipidemia, unspecified: Secondary | ICD-10-CM | POA: Diagnosis not present

## 2021-08-23 DIAGNOSIS — I872 Venous insufficiency (chronic) (peripheral): Secondary | ICD-10-CM | POA: Diagnosis not present

## 2021-08-23 DIAGNOSIS — G4733 Obstructive sleep apnea (adult) (pediatric): Secondary | ICD-10-CM | POA: Diagnosis not present

## 2021-08-23 DIAGNOSIS — G629 Polyneuropathy, unspecified: Secondary | ICD-10-CM | POA: Diagnosis not present

## 2021-08-23 DIAGNOSIS — K259 Gastric ulcer, unspecified as acute or chronic, without hemorrhage or perforation: Secondary | ICD-10-CM | POA: Diagnosis not present

## 2021-08-23 DIAGNOSIS — F32A Depression, unspecified: Secondary | ICD-10-CM | POA: Diagnosis not present

## 2021-08-23 DIAGNOSIS — I10 Essential (primary) hypertension: Secondary | ICD-10-CM | POA: Diagnosis not present

## 2021-08-24 DIAGNOSIS — F32A Depression, unspecified: Secondary | ICD-10-CM | POA: Diagnosis not present

## 2021-08-24 DIAGNOSIS — G4733 Obstructive sleep apnea (adult) (pediatric): Secondary | ICD-10-CM | POA: Diagnosis not present

## 2021-08-24 DIAGNOSIS — R238 Other skin changes: Secondary | ICD-10-CM | POA: Diagnosis not present

## 2021-08-24 DIAGNOSIS — I10 Essential (primary) hypertension: Secondary | ICD-10-CM | POA: Diagnosis not present

## 2021-08-24 DIAGNOSIS — M5136 Other intervertebral disc degeneration, lumbar region: Secondary | ICD-10-CM | POA: Diagnosis not present

## 2021-08-24 DIAGNOSIS — M300 Polyarteritis nodosa: Secondary | ICD-10-CM | POA: Diagnosis not present

## 2021-08-24 DIAGNOSIS — L989 Disorder of the skin and subcutaneous tissue, unspecified: Secondary | ICD-10-CM | POA: Diagnosis not present

## 2021-08-24 DIAGNOSIS — E538 Deficiency of other specified B group vitamins: Secondary | ICD-10-CM | POA: Diagnosis not present

## 2021-08-24 DIAGNOSIS — M797 Fibromyalgia: Secondary | ICD-10-CM | POA: Diagnosis not present

## 2021-08-24 DIAGNOSIS — K219 Gastro-esophageal reflux disease without esophagitis: Secondary | ICD-10-CM | POA: Diagnosis not present

## 2021-08-24 DIAGNOSIS — N390 Urinary tract infection, site not specified: Secondary | ICD-10-CM | POA: Diagnosis not present

## 2021-08-24 DIAGNOSIS — G9529 Other cord compression: Secondary | ICD-10-CM | POA: Diagnosis not present

## 2021-08-24 DIAGNOSIS — I872 Venous insufficiency (chronic) (peripheral): Secondary | ICD-10-CM | POA: Diagnosis not present

## 2021-08-24 DIAGNOSIS — G629 Polyneuropathy, unspecified: Secondary | ICD-10-CM | POA: Diagnosis not present

## 2021-08-24 DIAGNOSIS — E785 Hyperlipidemia, unspecified: Secondary | ICD-10-CM | POA: Diagnosis not present

## 2021-08-24 DIAGNOSIS — G9589 Other specified diseases of spinal cord: Secondary | ICD-10-CM | POA: Diagnosis not present

## 2021-08-24 DIAGNOSIS — K259 Gastric ulcer, unspecified as acute or chronic, without hemorrhage or perforation: Secondary | ICD-10-CM | POA: Diagnosis not present

## 2021-08-24 DIAGNOSIS — R350 Frequency of micturition: Secondary | ICD-10-CM | POA: Diagnosis not present

## 2021-08-24 DIAGNOSIS — M4802 Spinal stenosis, cervical region: Secondary | ICD-10-CM | POA: Diagnosis not present

## 2021-08-24 DIAGNOSIS — E559 Vitamin D deficiency, unspecified: Secondary | ICD-10-CM | POA: Diagnosis not present

## 2021-08-24 DIAGNOSIS — K760 Fatty (change of) liver, not elsewhere classified: Secondary | ICD-10-CM | POA: Diagnosis not present

## 2021-08-24 DIAGNOSIS — G43909 Migraine, unspecified, not intractable, without status migrainosus: Secondary | ICD-10-CM | POA: Diagnosis not present

## 2021-08-28 DIAGNOSIS — I10 Essential (primary) hypertension: Secondary | ICD-10-CM | POA: Diagnosis not present

## 2021-08-28 DIAGNOSIS — R350 Frequency of micturition: Secondary | ICD-10-CM | POA: Diagnosis not present

## 2021-08-28 DIAGNOSIS — E785 Hyperlipidemia, unspecified: Secondary | ICD-10-CM | POA: Diagnosis not present

## 2021-08-28 DIAGNOSIS — K219 Gastro-esophageal reflux disease without esophagitis: Secondary | ICD-10-CM | POA: Diagnosis not present

## 2021-08-28 DIAGNOSIS — F32A Depression, unspecified: Secondary | ICD-10-CM | POA: Diagnosis not present

## 2021-08-28 DIAGNOSIS — G4733 Obstructive sleep apnea (adult) (pediatric): Secondary | ICD-10-CM | POA: Diagnosis not present

## 2021-08-28 DIAGNOSIS — G629 Polyneuropathy, unspecified: Secondary | ICD-10-CM | POA: Diagnosis not present

## 2021-08-28 DIAGNOSIS — N390 Urinary tract infection, site not specified: Secondary | ICD-10-CM | POA: Diagnosis not present

## 2021-08-28 DIAGNOSIS — M5136 Other intervertebral disc degeneration, lumbar region: Secondary | ICD-10-CM | POA: Diagnosis not present

## 2021-08-28 DIAGNOSIS — I872 Venous insufficiency (chronic) (peripheral): Secondary | ICD-10-CM | POA: Diagnosis not present

## 2021-08-28 DIAGNOSIS — M4802 Spinal stenosis, cervical region: Secondary | ICD-10-CM | POA: Diagnosis not present

## 2021-08-28 DIAGNOSIS — G9589 Other specified diseases of spinal cord: Secondary | ICD-10-CM | POA: Diagnosis not present

## 2021-08-28 DIAGNOSIS — G43909 Migraine, unspecified, not intractable, without status migrainosus: Secondary | ICD-10-CM | POA: Diagnosis not present

## 2021-08-28 DIAGNOSIS — M797 Fibromyalgia: Secondary | ICD-10-CM | POA: Diagnosis not present

## 2021-08-28 DIAGNOSIS — L989 Disorder of the skin and subcutaneous tissue, unspecified: Secondary | ICD-10-CM | POA: Diagnosis not present

## 2021-08-28 DIAGNOSIS — R238 Other skin changes: Secondary | ICD-10-CM | POA: Diagnosis not present

## 2021-08-28 DIAGNOSIS — E538 Deficiency of other specified B group vitamins: Secondary | ICD-10-CM | POA: Diagnosis not present

## 2021-08-28 DIAGNOSIS — K760 Fatty (change of) liver, not elsewhere classified: Secondary | ICD-10-CM | POA: Diagnosis not present

## 2021-08-28 DIAGNOSIS — K259 Gastric ulcer, unspecified as acute or chronic, without hemorrhage or perforation: Secondary | ICD-10-CM | POA: Diagnosis not present

## 2021-08-28 DIAGNOSIS — G9529 Other cord compression: Secondary | ICD-10-CM | POA: Diagnosis not present

## 2021-08-28 DIAGNOSIS — M300 Polyarteritis nodosa: Secondary | ICD-10-CM | POA: Diagnosis not present

## 2021-08-28 DIAGNOSIS — E559 Vitamin D deficiency, unspecified: Secondary | ICD-10-CM | POA: Diagnosis not present

## 2021-08-30 ENCOUNTER — Telehealth: Payer: Self-pay | Admitting: Family Medicine

## 2021-08-30 ENCOUNTER — Telehealth: Payer: Self-pay | Admitting: Pharmacist

## 2021-08-30 DIAGNOSIS — G629 Polyneuropathy, unspecified: Secondary | ICD-10-CM | POA: Diagnosis not present

## 2021-08-30 DIAGNOSIS — E785 Hyperlipidemia, unspecified: Secondary | ICD-10-CM | POA: Diagnosis not present

## 2021-08-30 DIAGNOSIS — R350 Frequency of micturition: Secondary | ICD-10-CM | POA: Diagnosis not present

## 2021-08-30 DIAGNOSIS — M300 Polyarteritis nodosa: Secondary | ICD-10-CM | POA: Diagnosis not present

## 2021-08-30 DIAGNOSIS — E559 Vitamin D deficiency, unspecified: Secondary | ICD-10-CM | POA: Diagnosis not present

## 2021-08-30 DIAGNOSIS — G43909 Migraine, unspecified, not intractable, without status migrainosus: Secondary | ICD-10-CM | POA: Diagnosis not present

## 2021-08-30 DIAGNOSIS — L989 Disorder of the skin and subcutaneous tissue, unspecified: Secondary | ICD-10-CM | POA: Diagnosis not present

## 2021-08-30 DIAGNOSIS — K219 Gastro-esophageal reflux disease without esophagitis: Secondary | ICD-10-CM | POA: Diagnosis not present

## 2021-08-30 DIAGNOSIS — K259 Gastric ulcer, unspecified as acute or chronic, without hemorrhage or perforation: Secondary | ICD-10-CM | POA: Diagnosis not present

## 2021-08-30 DIAGNOSIS — G9529 Other cord compression: Secondary | ICD-10-CM | POA: Diagnosis not present

## 2021-08-30 DIAGNOSIS — I872 Venous insufficiency (chronic) (peripheral): Secondary | ICD-10-CM | POA: Diagnosis not present

## 2021-08-30 DIAGNOSIS — M4802 Spinal stenosis, cervical region: Secondary | ICD-10-CM | POA: Diagnosis not present

## 2021-08-30 DIAGNOSIS — M797 Fibromyalgia: Secondary | ICD-10-CM | POA: Diagnosis not present

## 2021-08-30 DIAGNOSIS — E538 Deficiency of other specified B group vitamins: Secondary | ICD-10-CM | POA: Diagnosis not present

## 2021-08-30 DIAGNOSIS — M5136 Other intervertebral disc degeneration, lumbar region: Secondary | ICD-10-CM | POA: Diagnosis not present

## 2021-08-30 DIAGNOSIS — N390 Urinary tract infection, site not specified: Secondary | ICD-10-CM | POA: Diagnosis not present

## 2021-08-30 DIAGNOSIS — I10 Essential (primary) hypertension: Secondary | ICD-10-CM | POA: Diagnosis not present

## 2021-08-30 DIAGNOSIS — G4733 Obstructive sleep apnea (adult) (pediatric): Secondary | ICD-10-CM | POA: Diagnosis not present

## 2021-08-30 DIAGNOSIS — K760 Fatty (change of) liver, not elsewhere classified: Secondary | ICD-10-CM | POA: Diagnosis not present

## 2021-08-30 DIAGNOSIS — R238 Other skin changes: Secondary | ICD-10-CM | POA: Diagnosis not present

## 2021-08-30 DIAGNOSIS — F32A Depression, unspecified: Secondary | ICD-10-CM | POA: Diagnosis not present

## 2021-08-30 DIAGNOSIS — G9589 Other specified diseases of spinal cord: Secondary | ICD-10-CM | POA: Diagnosis not present

## 2021-08-30 NOTE — Progress Notes (Signed)
? ? ?Chronic Care Management ?Pharmacy Assistant  ? ?Name: [name]  MRN: [id number] DOB: Oct 10, 1970 ? ? ?Reason for Encounter: Disease State - General Adherence Call  ?  ? ?Recent office visits:  ?08/10/21 [name], MD - Family Medicine (Video visit) - Pruritis - permethrin (ELIMITE) 5 % cream prescribed. Follow up as needed.  ? ?07/28/21 [name], MD - Family Medicine (Video visit) - Cellulitis of right lower extremity - Doxycycline (VIBRA-TABS) 100 MG tablet prescribed. Follow up if no improvement.  ? ?Recent consult visits:  ?None noted.  ? ?Hospital visits: 08/12/21 - 08/15/21 ?Medication Reconciliation was completed by comparing discharge summary, patient?s EMR and Pharmacy list, and upon discussion with patient. ? ?Admitted to the hospital on 08/12/21 due to Elevated LFTs. Discharge date was 08/15/21. Discharged from [facility].   ? ?New?Medications Started at [facility] Discharge:?? ?None noted.  ? ?Medication Changes at Hospital Discharge: ?None noted.  ? ?Medications Discontinued at Hospital Discharge: ?nitrofurantoin (macrocrystal-monohydrate) 100 MG capsule (Macrobid) ? ?Medications that remain the same after Hospital Discharge:??  ?All other medications will remain the same.   ? ?Hospital visits: 06/04/21 - 06/10/21 ?Medication Reconciliation was completed by comparing discharge summary, patient?s EMR and Pharmacy list, and upon discussion with patient. ?  ?Admitted to the hospital on 06/04/21 due to Weakness. Discharge date was 06/10/21. Discharged from [facility].   ?  ?New?Medications Started at [facility] Discharge:?? ?ciprofloxacin (CIPRO) ?folic acid (FOLVITE) ?pyridoxine (B-6) ?thiamine ?vitamin B-12 ?  ?Medication Changes at Hospital Discharge: ?None noted.  ?  ?Medications Discontinued at Hospital Discharge: ?clobetasol cream 0.05 % (TEMOVATE) ?doxycycline 100 MG tablet (VIBRA-TABS) ?fluconazole 100 MG tablet (Diflucan) ?HYDROcodone-acetaminophen 10-325 MG tablet  (NORCO) ?irbesartan 75 MG tablet (AVAPRO) ?meclizine 25 MG tablet (ANTIVERT) ?mupirocin ointment 2 % (BACTROBAN) ?nystatin powder ?  ?Medications that remain the same after Hospital Discharge:??  ?All other medications will remain the same ? ?Medications: ?Outpatient Encounter Medications as of 08/30/2021  ?Medication Sig Note  ? ALPRAZolam (XANAX) 0.5 MG tablet Take 1 tablet (0.5 mg total) by mouth 2 (two) times daily. (Patient taking differently: Take 0.5 mg by mouth See admin instructions. Pt has recently been taking 0.'5mg'$  every 6-8 hours daily.) 08/13/2021: Prescribed to take 0.'5mg'$  BID.  ? ARIPiprazole (ABILIFY) 10 MG tablet Take 10 mg by mouth in the morning.    ? Armodafinil 250 MG tablet Take 250 mg by mouth in the morning and at bedtime. 08/13/2021: Pt was prescribed to take '250mg'$  TID 8am, 2pm, 6pm, however pt has been taking bid lately.  ? Biotin 10 MG TABS Take 10 mg by mouth in the morning.    ? buPROPion (WELLBUTRIN XL) 300 MG 24 hr tablet TAKE 1 TABLET BY MOUTH EVERY DAY (Patient taking differently: Take 300 mg by mouth daily.)   ? calcium citrate (CALCITRATE - DOSED IN MG ELEMENTAL CALCIUM) 950 MG tablet Take 1 tablet by mouth daily.   ? clonazePAM (KLONOPIN) 1 MG tablet TAKE 1 TABLET BY MOUTH AT BEDTIME (Patient taking differently: Take 1 mg by mouth at bedtime.)   ? cyclobenzaprine (FLEXERIL) 10 MG tablet Take 2 tablets (20 mg total) by mouth 2 (two) times daily.   ? diclofenac Sodium (VOLTAREN) 1 % GEL Apply 4 g topically 4 (four) times daily.   ? diphenhydrAMINE (BENADRYL) 25 MG tablet Take 25 mg by mouth 2 (two) times daily as needed for allergies.   ? DULoxetine (CYMBALTA) 60 MG capsule Take 2 capsules (120 mg total) by mouth  daily. (Patient taking differently: Take 60 mg by mouth 2 (two) times daily.)   ? FEROSUL 325 (65 Fe) MG tablet TAKE 1 TABLET BY MOUTH EVERY DAY WITH BREAKFAST (Patient taking differently: Take 325 mg by mouth daily with breakfast.)   ? folic acid (FOLVITE) 1 MG tablet TAKE 1  TABLET BY MOUTH EVERY DAY   ? furosemide (LASIX) 40 MG tablet TAKE 1 TABLET BY MOUTH EVERY DAY MAY take additional TABLET IF weight INCREASE by 5lb AND call MD FOR further instructions (Patient taking differently: Take 40 mg by mouth daily. 1t qd-may take an additional tablet if weight increases by 5lbs.) 08/13/2021: Per pt's son, pt has not been taking this medication recently as she has been attached to a purewick, and taking the Lasix while attached to the [place] was creating "issues" per son.  ? gabapentin (NEURONTIN) 600 MG tablet Take 1 tablet (600 mg total) by mouth 3 (three) times daily. TAKE 2 TABLETS BY MOUTH 3 TIMES DAILY (Patient taking differently: Take 600 mg by mouth 3 (three) times daily.)   ? GNP VITAMIN B-1 100 MG tablet TAKE 1 TABLET BY MOUTH EVERY DAY   ? HYDROcodone-acetaminophen (NORCO) 10-325 MG tablet TAKE 1 TABLET BY MOUTH EVERY 6 HOURS AS NEEDED (Patient taking differently: Take 1 tablet by mouth every 6 (six) hours as needed for severe pain.)   ? nebivolol (BYSTOLIC) 10 MG tablet TAKE 1 TABLET BY MOUTH EVERY DAY (Patient taking differently: Take 10 mg by mouth daily.)   ? nystatin (MYCOSTATIN/NYSTOP) powder APPLY 1 APPLICATION TOPICALLY 3 TIMES DAILY (Patient taking differently: Apply 1 application. topically 3 (three) times daily.)   ? Oxcarbazepine (TRILEPTAL) 300 MG tablet Take 300 mg by mouth 2 (two) times daily.   ? pantoprazole (PROTONIX) 40 MG tablet TAKE 1 TABLET BY MOUTH 2 TIMES DAILY (Patient taking differently: Take 40 mg by mouth 2 (two) times daily.)   ? phentermine 37.5 MG capsule TAKE 1 CAPSULE BY MOUTH EVERY MORNING   ? promethazine (PHENERGAN) 25 MG tablet TAKE 1 TABLET BY MOUTH EVERY 6 HOURS AS NEEDED FOR NAUSEA AND vomiting (Patient taking differently: Take 25 mg by mouth every 6 (six) hours as needed for nausea or vomiting.)   ? Pyridoxine HCl (B-6) 100 MG TABS TAKE 1 TABLET BY MOUTH EVERY DAY   ? rosuvastatin (CRESTOR) 20 MG tablet TAKE 1 TABLET BY MOUTH EVERY DAY  (Patient taking differently: Take 20 mg by mouth daily.)   ? senna-docusate (SENOKOT-S) 8.6-50 MG tablet Take 1 tablet by mouth at bedtime. (Patient taking differently: Take 1 tablet by mouth daily as needed for mild constipation.)   ? SKYRIZI 150 MG/ML SOSY Inject 150 mg into the skin every 3 (three) months.   ? vitamin B-12 (CYANOCOBALAMIN) 1000 MCG tablet TAKE 1 TABLET BY MOUTH EVERY DAY   ? ?No facility-administered encounter medications on file as of 08/30/2021.  ? ? ?Have you had any problems recently with your health? ?Patient reports she was recently in the hospital but is doing better now.  ? ?Have you had any problems with your pharmacy? ?Patient denied any problems with her current pharmacy.  ? ?What issues or side effects are you having with your medications? ?Patient reported she is having issues with her B12 levels since her recently hospitalizations.  ? ?What would you like me to pass along to Leata Mouse, CPP for them to help you with?  ?Patient has called the office today to see if she can get her B12  levels checked to see if she may need to go to B12 injections. She is waiting to hear back from the office.  ? ?What can we do to take care of you better? ?Patient did not have any recommendations at this time. She thanked me for the follow up call and will call with any future questions or concerns.  ? ? ?Care Gaps ? ?AWV: done 09/14/20 ?Colonoscopy: upper EGD done 07/27/19 ?DM Eye Exam: N/A ?DM Foot Exam: N/A ?Microalbumin: N/A ?HbgAIC: done 06/04/21 (5.1) ?DEXA: ordered ?Mammogram: done 01/09/20 ? ? ?Star Rating Drugs: ?Rosuvastatin (CRESTOR) 20 MG tablet - last filled 08/18/21 28 days  ? ? ?No future appointments. ? ? ?Liza Showfety, CCMA ?Clinical Pharmacist Assistant  ?[phone] ? ? ?

## 2021-08-30 NOTE — Telephone Encounter (Signed)
Pt called in stating that she is having leg cramping and tingling in her hands and feet. Her home health nurse thinks her B12 is low. Wanted to know if order can be giving to the nurse to have her B 12 checked. The nurse will be at her house around 11 today. ? ?Please advise  ?

## 2021-08-30 NOTE — Telephone Encounter (Signed)
[place] for [facility] to draw B12 level next time they are at the house ?

## 2021-08-31 ENCOUNTER — Telehealth: Payer: Self-pay | Admitting: Family Medicine

## 2021-08-31 DIAGNOSIS — I872 Venous insufficiency (chronic) (peripheral): Secondary | ICD-10-CM | POA: Diagnosis not present

## 2021-08-31 DIAGNOSIS — F32A Depression, unspecified: Secondary | ICD-10-CM | POA: Diagnosis not present

## 2021-08-31 DIAGNOSIS — M4802 Spinal stenosis, cervical region: Secondary | ICD-10-CM | POA: Diagnosis not present

## 2021-08-31 DIAGNOSIS — M797 Fibromyalgia: Secondary | ICD-10-CM | POA: Diagnosis not present

## 2021-08-31 DIAGNOSIS — M5136 Other intervertebral disc degeneration, lumbar region: Secondary | ICD-10-CM | POA: Diagnosis not present

## 2021-08-31 DIAGNOSIS — G629 Polyneuropathy, unspecified: Secondary | ICD-10-CM | POA: Diagnosis not present

## 2021-08-31 DIAGNOSIS — G4733 Obstructive sleep apnea (adult) (pediatric): Secondary | ICD-10-CM | POA: Diagnosis not present

## 2021-08-31 DIAGNOSIS — G9529 Other cord compression: Secondary | ICD-10-CM | POA: Diagnosis not present

## 2021-08-31 DIAGNOSIS — K259 Gastric ulcer, unspecified as acute or chronic, without hemorrhage or perforation: Secondary | ICD-10-CM | POA: Diagnosis not present

## 2021-08-31 DIAGNOSIS — N390 Urinary tract infection, site not specified: Secondary | ICD-10-CM | POA: Diagnosis not present

## 2021-08-31 DIAGNOSIS — E785 Hyperlipidemia, unspecified: Secondary | ICD-10-CM | POA: Diagnosis not present

## 2021-08-31 DIAGNOSIS — G9589 Other specified diseases of spinal cord: Secondary | ICD-10-CM | POA: Diagnosis not present

## 2021-08-31 DIAGNOSIS — K219 Gastro-esophageal reflux disease without esophagitis: Secondary | ICD-10-CM | POA: Diagnosis not present

## 2021-08-31 DIAGNOSIS — R350 Frequency of micturition: Secondary | ICD-10-CM | POA: Diagnosis not present

## 2021-08-31 DIAGNOSIS — R238 Other skin changes: Secondary | ICD-10-CM | POA: Diagnosis not present

## 2021-08-31 DIAGNOSIS — E559 Vitamin D deficiency, unspecified: Secondary | ICD-10-CM | POA: Diagnosis not present

## 2021-08-31 DIAGNOSIS — K760 Fatty (change of) liver, not elsewhere classified: Secondary | ICD-10-CM | POA: Diagnosis not present

## 2021-08-31 DIAGNOSIS — G43909 Migraine, unspecified, not intractable, without status migrainosus: Secondary | ICD-10-CM | POA: Diagnosis not present

## 2021-08-31 DIAGNOSIS — I10 Essential (primary) hypertension: Secondary | ICD-10-CM | POA: Diagnosis not present

## 2021-08-31 DIAGNOSIS — M300 Polyarteritis nodosa: Secondary | ICD-10-CM | POA: Diagnosis not present

## 2021-08-31 DIAGNOSIS — L989 Disorder of the skin and subcutaneous tissue, unspecified: Secondary | ICD-10-CM | POA: Diagnosis not present

## 2021-08-31 DIAGNOSIS — E538 Deficiency of other specified B group vitamins: Secondary | ICD-10-CM | POA: Diagnosis not present

## 2021-08-31 NOTE — Telephone Encounter (Signed)
Spoke to [name] and let her know that it was ok to draw a b12 level.  ?

## 2021-08-31 NOTE — Telephone Encounter (Signed)
Jerilynn for [facility] that is currently working with patient in PT states she would need a call at [phone] regarding patient. She states that Patient would need a referral for a motorized scooter. ?

## 2021-09-01 ENCOUNTER — Other Ambulatory Visit: Payer: Self-pay | Admitting: Family Medicine

## 2021-09-01 NOTE — Telephone Encounter (Signed)
Called LM asking for her to return call and discuss details of order so we can accommodate this request  ?

## 2021-09-01 NOTE — Telephone Encounter (Signed)
[name] called back and stated that she would call PT to get the ball rolling and get them to fax [place] the info that's needed.  ?

## 2021-09-01 NOTE — Telephone Encounter (Signed)
I am happy to provide a referral but I would need more information regarding where to send referral, information it must include, etc.  Typically a pt needs a fully documented PT assessment in order to qualify ?

## 2021-09-03 ENCOUNTER — Telehealth: Payer: Self-pay

## 2021-09-03 NOTE — Telephone Encounter (Signed)
noted 

## 2021-09-03 NOTE — Telephone Encounter (Signed)
[name] from [facility] called. [name] rescheduled today's visit with them due to not feeling well, has moved the appointment to next week.  ?

## 2021-09-06 ENCOUNTER — Encounter: Payer: Self-pay | Admitting: Family Medicine

## 2021-09-06 ENCOUNTER — Other Ambulatory Visit: Payer: Self-pay | Admitting: Family Medicine

## 2021-09-06 DIAGNOSIS — K760 Fatty (change of) liver, not elsewhere classified: Secondary | ICD-10-CM | POA: Diagnosis not present

## 2021-09-06 DIAGNOSIS — M5136 Other intervertebral disc degeneration, lumbar region: Secondary | ICD-10-CM | POA: Diagnosis not present

## 2021-09-06 DIAGNOSIS — K219 Gastro-esophageal reflux disease without esophagitis: Secondary | ICD-10-CM | POA: Diagnosis not present

## 2021-09-06 DIAGNOSIS — R238 Other skin changes: Secondary | ICD-10-CM | POA: Diagnosis not present

## 2021-09-06 DIAGNOSIS — G629 Polyneuropathy, unspecified: Secondary | ICD-10-CM | POA: Diagnosis not present

## 2021-09-06 DIAGNOSIS — G9589 Other specified diseases of spinal cord: Secondary | ICD-10-CM | POA: Diagnosis not present

## 2021-09-06 DIAGNOSIS — M300 Polyarteritis nodosa: Secondary | ICD-10-CM | POA: Diagnosis not present

## 2021-09-06 DIAGNOSIS — E538 Deficiency of other specified B group vitamins: Secondary | ICD-10-CM | POA: Diagnosis not present

## 2021-09-06 DIAGNOSIS — E785 Hyperlipidemia, unspecified: Secondary | ICD-10-CM | POA: Diagnosis not present

## 2021-09-06 DIAGNOSIS — M797 Fibromyalgia: Secondary | ICD-10-CM | POA: Diagnosis not present

## 2021-09-06 DIAGNOSIS — N390 Urinary tract infection, site not specified: Secondary | ICD-10-CM | POA: Diagnosis not present

## 2021-09-06 DIAGNOSIS — E559 Vitamin D deficiency, unspecified: Secondary | ICD-10-CM | POA: Diagnosis not present

## 2021-09-06 DIAGNOSIS — R350 Frequency of micturition: Secondary | ICD-10-CM | POA: Diagnosis not present

## 2021-09-06 DIAGNOSIS — I872 Venous insufficiency (chronic) (peripheral): Secondary | ICD-10-CM | POA: Diagnosis not present

## 2021-09-06 DIAGNOSIS — G4733 Obstructive sleep apnea (adult) (pediatric): Secondary | ICD-10-CM | POA: Diagnosis not present

## 2021-09-06 DIAGNOSIS — I10 Essential (primary) hypertension: Secondary | ICD-10-CM | POA: Diagnosis not present

## 2021-09-06 DIAGNOSIS — L989 Disorder of the skin and subcutaneous tissue, unspecified: Secondary | ICD-10-CM | POA: Diagnosis not present

## 2021-09-06 DIAGNOSIS — F32A Depression, unspecified: Secondary | ICD-10-CM | POA: Diagnosis not present

## 2021-09-06 DIAGNOSIS — M4802 Spinal stenosis, cervical region: Secondary | ICD-10-CM | POA: Diagnosis not present

## 2021-09-06 DIAGNOSIS — K259 Gastric ulcer, unspecified as acute or chronic, without hemorrhage or perforation: Secondary | ICD-10-CM | POA: Diagnosis not present

## 2021-09-06 DIAGNOSIS — G9529 Other cord compression: Secondary | ICD-10-CM | POA: Diagnosis not present

## 2021-09-06 DIAGNOSIS — G43909 Migraine, unspecified, not intractable, without status migrainosus: Secondary | ICD-10-CM | POA: Diagnosis not present

## 2021-09-07 DIAGNOSIS — L989 Disorder of the skin and subcutaneous tissue, unspecified: Secondary | ICD-10-CM | POA: Diagnosis not present

## 2021-09-07 DIAGNOSIS — K760 Fatty (change of) liver, not elsewhere classified: Secondary | ICD-10-CM | POA: Diagnosis not present

## 2021-09-07 DIAGNOSIS — A419 Sepsis, unspecified organism: Secondary | ICD-10-CM | POA: Diagnosis not present

## 2021-09-07 DIAGNOSIS — M797 Fibromyalgia: Secondary | ICD-10-CM | POA: Diagnosis not present

## 2021-09-07 DIAGNOSIS — G9529 Other cord compression: Secondary | ICD-10-CM | POA: Diagnosis not present

## 2021-09-07 DIAGNOSIS — F32A Depression, unspecified: Secondary | ICD-10-CM | POA: Diagnosis not present

## 2021-09-07 DIAGNOSIS — M5136 Other intervertebral disc degeneration, lumbar region: Secondary | ICD-10-CM | POA: Diagnosis not present

## 2021-09-07 DIAGNOSIS — G629 Polyneuropathy, unspecified: Secondary | ICD-10-CM | POA: Diagnosis not present

## 2021-09-07 DIAGNOSIS — K259 Gastric ulcer, unspecified as acute or chronic, without hemorrhage or perforation: Secondary | ICD-10-CM | POA: Diagnosis not present

## 2021-09-07 DIAGNOSIS — I872 Venous insufficiency (chronic) (peripheral): Secondary | ICD-10-CM | POA: Diagnosis not present

## 2021-09-07 DIAGNOSIS — G9589 Other specified diseases of spinal cord: Secondary | ICD-10-CM | POA: Diagnosis not present

## 2021-09-07 DIAGNOSIS — I10 Essential (primary) hypertension: Secondary | ICD-10-CM | POA: Diagnosis not present

## 2021-09-07 DIAGNOSIS — E538 Deficiency of other specified B group vitamins: Secondary | ICD-10-CM | POA: Diagnosis not present

## 2021-09-07 DIAGNOSIS — R350 Frequency of micturition: Secondary | ICD-10-CM | POA: Diagnosis not present

## 2021-09-07 DIAGNOSIS — R238 Other skin changes: Secondary | ICD-10-CM | POA: Diagnosis not present

## 2021-09-07 DIAGNOSIS — E559 Vitamin D deficiency, unspecified: Secondary | ICD-10-CM | POA: Diagnosis not present

## 2021-09-07 DIAGNOSIS — K219 Gastro-esophageal reflux disease without esophagitis: Secondary | ICD-10-CM | POA: Diagnosis not present

## 2021-09-07 DIAGNOSIS — N390 Urinary tract infection, site not specified: Secondary | ICD-10-CM | POA: Diagnosis not present

## 2021-09-07 DIAGNOSIS — M4802 Spinal stenosis, cervical region: Secondary | ICD-10-CM | POA: Diagnosis not present

## 2021-09-07 DIAGNOSIS — G43909 Migraine, unspecified, not intractable, without status migrainosus: Secondary | ICD-10-CM | POA: Diagnosis not present

## 2021-09-07 DIAGNOSIS — G4733 Obstructive sleep apnea (adult) (pediatric): Secondary | ICD-10-CM | POA: Diagnosis not present

## 2021-09-07 DIAGNOSIS — E871 Hypo-osmolality and hyponatremia: Secondary | ICD-10-CM | POA: Diagnosis not present

## 2021-09-07 DIAGNOSIS — M300 Polyarteritis nodosa: Secondary | ICD-10-CM | POA: Diagnosis not present

## 2021-09-07 DIAGNOSIS — E785 Hyperlipidemia, unspecified: Secondary | ICD-10-CM | POA: Diagnosis not present

## 2021-09-07 NOTE — Telephone Encounter (Signed)
Patient is requesting a refill of the following medications: ?Requested Prescriptions  ? ?Pending Prescriptions Disp Refills  ? HYDROcodone-acetaminophen (NORCO) 10-325 MG tablet [Pharmacy Med Name: hydrocodone 10 mg-acetaminophen 325 mg tablet] 90 tablet 0  ?  Sig: TAKE 1 TABLET BY MOUTH EVERY 6 HOURS AS NEEDED  ? ? ?Date of patient request: 09/07/21 ?Last office visit: 08/10/21 ?Date of last refill: 08/09/21 ?Last refill amount: 90 ? ? ?

## 2021-09-08 ENCOUNTER — Telehealth: Payer: Self-pay | Admitting: Family Medicine

## 2021-09-08 NOTE — Telephone Encounter (Signed)
Pt called in stating that we need to call [phone] to place the order for the power scooter.  ? ?Please advise  ?

## 2021-09-10 DIAGNOSIS — M5136 Other intervertebral disc degeneration, lumbar region: Secondary | ICD-10-CM | POA: Diagnosis not present

## 2021-09-10 DIAGNOSIS — G43909 Migraine, unspecified, not intractable, without status migrainosus: Secondary | ICD-10-CM | POA: Diagnosis not present

## 2021-09-10 DIAGNOSIS — E785 Hyperlipidemia, unspecified: Secondary | ICD-10-CM | POA: Diagnosis not present

## 2021-09-10 DIAGNOSIS — G9529 Other cord compression: Secondary | ICD-10-CM | POA: Diagnosis not present

## 2021-09-10 DIAGNOSIS — N390 Urinary tract infection, site not specified: Secondary | ICD-10-CM | POA: Diagnosis not present

## 2021-09-10 DIAGNOSIS — M4802 Spinal stenosis, cervical region: Secondary | ICD-10-CM | POA: Diagnosis not present

## 2021-09-10 DIAGNOSIS — G4733 Obstructive sleep apnea (adult) (pediatric): Secondary | ICD-10-CM | POA: Diagnosis not present

## 2021-09-10 DIAGNOSIS — K760 Fatty (change of) liver, not elsewhere classified: Secondary | ICD-10-CM | POA: Diagnosis not present

## 2021-09-10 DIAGNOSIS — M300 Polyarteritis nodosa: Secondary | ICD-10-CM | POA: Diagnosis not present

## 2021-09-10 DIAGNOSIS — R238 Other skin changes: Secondary | ICD-10-CM | POA: Diagnosis not present

## 2021-09-10 DIAGNOSIS — G629 Polyneuropathy, unspecified: Secondary | ICD-10-CM | POA: Diagnosis not present

## 2021-09-10 DIAGNOSIS — E538 Deficiency of other specified B group vitamins: Secondary | ICD-10-CM | POA: Diagnosis not present

## 2021-09-10 DIAGNOSIS — F32A Depression, unspecified: Secondary | ICD-10-CM | POA: Diagnosis not present

## 2021-09-10 DIAGNOSIS — K259 Gastric ulcer, unspecified as acute or chronic, without hemorrhage or perforation: Secondary | ICD-10-CM | POA: Diagnosis not present

## 2021-09-10 DIAGNOSIS — K219 Gastro-esophageal reflux disease without esophagitis: Secondary | ICD-10-CM | POA: Diagnosis not present

## 2021-09-10 DIAGNOSIS — I10 Essential (primary) hypertension: Secondary | ICD-10-CM | POA: Diagnosis not present

## 2021-09-10 DIAGNOSIS — E559 Vitamin D deficiency, unspecified: Secondary | ICD-10-CM | POA: Diagnosis not present

## 2021-09-10 DIAGNOSIS — G9589 Other specified diseases of spinal cord: Secondary | ICD-10-CM | POA: Diagnosis not present

## 2021-09-10 DIAGNOSIS — R350 Frequency of micturition: Secondary | ICD-10-CM | POA: Diagnosis not present

## 2021-09-10 DIAGNOSIS — L989 Disorder of the skin and subcutaneous tissue, unspecified: Secondary | ICD-10-CM | POA: Diagnosis not present

## 2021-09-10 DIAGNOSIS — I872 Venous insufficiency (chronic) (peripheral): Secondary | ICD-10-CM | POA: Diagnosis not present

## 2021-09-10 DIAGNOSIS — M797 Fibromyalgia: Secondary | ICD-10-CM | POA: Diagnosis not present

## 2021-09-13 ENCOUNTER — Telehealth: Payer: Self-pay | Admitting: Family Medicine

## 2021-09-13 ENCOUNTER — Other Ambulatory Visit: Payer: Self-pay | Admitting: Family Medicine

## 2021-09-13 DIAGNOSIS — M4802 Spinal stenosis, cervical region: Secondary | ICD-10-CM | POA: Diagnosis not present

## 2021-09-13 DIAGNOSIS — G4733 Obstructive sleep apnea (adult) (pediatric): Secondary | ICD-10-CM | POA: Diagnosis not present

## 2021-09-13 DIAGNOSIS — I10 Essential (primary) hypertension: Secondary | ICD-10-CM | POA: Diagnosis not present

## 2021-09-13 DIAGNOSIS — G629 Polyneuropathy, unspecified: Secondary | ICD-10-CM | POA: Diagnosis not present

## 2021-09-13 DIAGNOSIS — M797 Fibromyalgia: Secondary | ICD-10-CM | POA: Diagnosis not present

## 2021-09-13 DIAGNOSIS — K259 Gastric ulcer, unspecified as acute or chronic, without hemorrhage or perforation: Secondary | ICD-10-CM | POA: Diagnosis not present

## 2021-09-13 DIAGNOSIS — G43909 Migraine, unspecified, not intractable, without status migrainosus: Secondary | ICD-10-CM | POA: Diagnosis not present

## 2021-09-13 DIAGNOSIS — R238 Other skin changes: Secondary | ICD-10-CM | POA: Diagnosis not present

## 2021-09-13 DIAGNOSIS — G9529 Other cord compression: Secondary | ICD-10-CM | POA: Diagnosis not present

## 2021-09-13 DIAGNOSIS — E538 Deficiency of other specified B group vitamins: Secondary | ICD-10-CM | POA: Diagnosis not present

## 2021-09-13 DIAGNOSIS — F32A Depression, unspecified: Secondary | ICD-10-CM | POA: Diagnosis not present

## 2021-09-13 DIAGNOSIS — L989 Disorder of the skin and subcutaneous tissue, unspecified: Secondary | ICD-10-CM | POA: Diagnosis not present

## 2021-09-13 DIAGNOSIS — M300 Polyarteritis nodosa: Secondary | ICD-10-CM | POA: Diagnosis not present

## 2021-09-13 DIAGNOSIS — N390 Urinary tract infection, site not specified: Secondary | ICD-10-CM | POA: Diagnosis not present

## 2021-09-13 DIAGNOSIS — R350 Frequency of micturition: Secondary | ICD-10-CM | POA: Diagnosis not present

## 2021-09-13 DIAGNOSIS — E559 Vitamin D deficiency, unspecified: Secondary | ICD-10-CM | POA: Diagnosis not present

## 2021-09-13 DIAGNOSIS — I872 Venous insufficiency (chronic) (peripheral): Secondary | ICD-10-CM | POA: Diagnosis not present

## 2021-09-13 DIAGNOSIS — K219 Gastro-esophageal reflux disease without esophagitis: Secondary | ICD-10-CM | POA: Diagnosis not present

## 2021-09-13 DIAGNOSIS — K760 Fatty (change of) liver, not elsewhere classified: Secondary | ICD-10-CM | POA: Diagnosis not present

## 2021-09-13 DIAGNOSIS — E785 Hyperlipidemia, unspecified: Secondary | ICD-10-CM | POA: Diagnosis not present

## 2021-09-13 DIAGNOSIS — G9589 Other specified diseases of spinal cord: Secondary | ICD-10-CM | POA: Diagnosis not present

## 2021-09-13 DIAGNOSIS — M5136 Other intervertebral disc degeneration, lumbar region: Secondary | ICD-10-CM | POA: Diagnosis not present

## 2021-09-13 NOTE — Telephone Encounter (Signed)
Called had to LM to have them call back and discuss  ?

## 2021-09-13 NOTE — Telephone Encounter (Signed)
Home health nurse called in stating that the pt is complaining of being very tired and says something just feels off with her.she states that she can't find anything clinically/physically wrong that would warrant her feeling this way. She states that it has been going on for about 3 days now. She wanted to talk to a nurse to see if pt should do a video visit with [name]? ? ?Please advise   ? ?[name] can be reached at [phone]  ?

## 2021-09-13 NOTE — Telephone Encounter (Signed)
Pt should have virtual  ?

## 2021-09-13 NOTE — Telephone Encounter (Signed)
Pt is scheduled °

## 2021-09-13 NOTE — Telephone Encounter (Signed)
Patient is requesting a refill of the following medications: ?Requested Prescriptions  ? ?Pending Prescriptions Disp Refills  ? clonazePAM (KLONOPIN) 1 MG tablet [Pharmacy Med Name: clonazepam 1 mg tablet] 30 tablet 3  ?  Sig: TAKE 1 TABLET BY MOUTH AT BEDTIME  ? ? ?Date of patient request: 09/13/21 ?Last office visit: 06/22/21 ?Date of last refill: 06/22/21 ?Last refill amount: 30 ? ? ?

## 2021-09-13 NOTE — Telephone Encounter (Signed)
Pt has an appointment today, I do not see previous mention of scooter, can we accommodate scooter request? ? ?

## 2021-09-13 NOTE — Telephone Encounter (Signed)
We can give verbal order for scooter but I'm not exactly sure that's what they want ?

## 2021-09-14 ENCOUNTER — Telehealth (INDEPENDENT_AMBULATORY_CARE_PROVIDER_SITE_OTHER): Payer: Medicare Other | Admitting: Family Medicine

## 2021-09-14 ENCOUNTER — Encounter: Payer: Self-pay | Admitting: Family Medicine

## 2021-09-14 VITALS — BP 112/70 | HR 98

## 2021-09-14 DIAGNOSIS — G43909 Migraine, unspecified, not intractable, without status migrainosus: Secondary | ICD-10-CM | POA: Diagnosis not present

## 2021-09-14 DIAGNOSIS — E785 Hyperlipidemia, unspecified: Secondary | ICD-10-CM | POA: Diagnosis not present

## 2021-09-14 DIAGNOSIS — F32A Depression, unspecified: Secondary | ICD-10-CM | POA: Diagnosis not present

## 2021-09-14 DIAGNOSIS — E559 Vitamin D deficiency, unspecified: Secondary | ICD-10-CM | POA: Diagnosis not present

## 2021-09-14 DIAGNOSIS — G9529 Other cord compression: Secondary | ICD-10-CM | POA: Diagnosis not present

## 2021-09-14 DIAGNOSIS — I10 Essential (primary) hypertension: Secondary | ICD-10-CM | POA: Diagnosis not present

## 2021-09-14 DIAGNOSIS — N390 Urinary tract infection, site not specified: Secondary | ICD-10-CM | POA: Diagnosis not present

## 2021-09-14 DIAGNOSIS — I872 Venous insufficiency (chronic) (peripheral): Secondary | ICD-10-CM | POA: Diagnosis not present

## 2021-09-14 DIAGNOSIS — M5136 Other intervertebral disc degeneration, lumbar region: Secondary | ICD-10-CM | POA: Diagnosis not present

## 2021-09-14 DIAGNOSIS — M797 Fibromyalgia: Secondary | ICD-10-CM | POA: Diagnosis not present

## 2021-09-14 DIAGNOSIS — R350 Frequency of micturition: Secondary | ICD-10-CM | POA: Diagnosis not present

## 2021-09-14 DIAGNOSIS — M300 Polyarteritis nodosa: Secondary | ICD-10-CM | POA: Diagnosis not present

## 2021-09-14 DIAGNOSIS — R238 Other skin changes: Secondary | ICD-10-CM | POA: Diagnosis not present

## 2021-09-14 DIAGNOSIS — L989 Disorder of the skin and subcutaneous tissue, unspecified: Secondary | ICD-10-CM | POA: Diagnosis not present

## 2021-09-14 DIAGNOSIS — G9589 Other specified diseases of spinal cord: Secondary | ICD-10-CM | POA: Diagnosis not present

## 2021-09-14 DIAGNOSIS — K219 Gastro-esophageal reflux disease without esophagitis: Secondary | ICD-10-CM | POA: Diagnosis not present

## 2021-09-14 DIAGNOSIS — G629 Polyneuropathy, unspecified: Secondary | ICD-10-CM | POA: Diagnosis not present

## 2021-09-14 DIAGNOSIS — G4733 Obstructive sleep apnea (adult) (pediatric): Secondary | ICD-10-CM | POA: Diagnosis not present

## 2021-09-14 DIAGNOSIS — M4802 Spinal stenosis, cervical region: Secondary | ICD-10-CM | POA: Diagnosis not present

## 2021-09-14 DIAGNOSIS — F419 Anxiety disorder, unspecified: Secondary | ICD-10-CM

## 2021-09-14 DIAGNOSIS — K259 Gastric ulcer, unspecified as acute or chronic, without hemorrhage or perforation: Secondary | ICD-10-CM | POA: Diagnosis not present

## 2021-09-14 DIAGNOSIS — E538 Deficiency of other specified B group vitamins: Secondary | ICD-10-CM | POA: Diagnosis not present

## 2021-09-14 DIAGNOSIS — K760 Fatty (change of) liver, not elsewhere classified: Secondary | ICD-10-CM | POA: Diagnosis not present

## 2021-09-14 MED ORDER — ARIPIPRAZOLE 15 MG PO TABS: 15.0000 mg | ORAL_TABLET | Freq: Every day | ORAL | 1 refills | Status: DC

## 2021-09-14 NOTE — Telephone Encounter (Signed)
Called l/m.

## 2021-09-14 NOTE — Progress Notes (Signed)
? ?Virtual Visit via Video  ? ?I connected with patient on 09/14/21 at 11:20 AM EDT by a video enabled telemedicine application and verified that I am speaking with the correct person using two identifiers. ? ?Location patient: Home ?Location provider: [facility], Office ?Persons participating in the virtual visit: Patient, Provider, Colleton Marcille Blanco C) ? ?I discussed the limitations of evaluation and management by telemedicine and the availability of in person appointments. The patient expressed understanding and agreed to proceed. ? ?Subjective:  ? ?HPI:  ?Fatigue- pt reports feeling 'weak and tired for the last 3-4 days'.  Pt reports having a 'real hard time' emotionally due to being stuck in the house and mostly bed bound.  Pt feels like fatigue is more emotional than physical.  States very few people come to visit.  Currently on Wellbutrin '300mg'$  daily, Abilify '10mg'$  daily, Cymbalta '60mg'$  BID ? ?ROS:  ? ?See pertinent positives and negatives per HPI. ? ?Patient Active Problem List  ? Diagnosis Date Noted  ? Positive blood cultures 08/14/2021  ? PNA (pneumonia) 08/12/2021  ? Fever of unknown origin 08/12/2021  ? Sacral pressure sore 07/06/2021  ? Vitamin B6 deficiency 06/08/2021  ? Myelomalacia of cervical cord ([place]) 06/06/2021  ? Compressive cervical cord myelomalacia ([place]) 06/05/2021  ? B12 deficiency 06/05/2021  ? Folate deficiency 06/05/2021  ? Vitamin D deficiency 06/05/2021  ? Leg weakness, bilateral 06/04/2021  ? Cervical atypia 01/06/2021  ? Steatosis of liver 01/06/2021  ? Hyponatremia 07/27/2019  ? Urinary urgency 04/04/2018  ? Chronic narcotic use 09/07/2016  ? Acute blood loss anemia 01/14/2016  ? Multiple gastric ulcers   ? PAN (polyarteritis nodosa) ([place]) 11/24/2015  ? GERD (gastroesophageal reflux disease) 01/08/2014  ? Routine general medical examination at a health care facility 04/23/2013  ? Bariatric surgery status 10/31/2012  ? S/P skin biopsy 10/31/2012  ? Psoriasis 01/09/2012  ? DDD  (degenerative disc disease), lumbar 11/30/2011  ? Migraine headache   ? [name] (obstructive sleep apnea) 11/16/2010  ? HYPERGLYCEMIA, FASTING 10/08/2009  ? CONTRACTURE OF TENDON 08/17/2009  ? PARESTHESIA, HANDS 12/23/2008  ? Leg pain, right 11/05/2008  ? ADVERSE DRUG REACTION 04/03/2008  ? PERIPHERAL EDEMA 03/26/2008  ? Obesity, Class III, BMI 40-49.9 (morbid obesity) ([place]) 01/03/2007  ? Cellulitis 01/03/2007  ? Hyperlipidemia 09/22/2006  ? Fibromyalgia 09/20/2006  ? Anxiety and depression 06/28/2006  ? Essential hypertension 06/28/2006  ?  ?Social History  ? ?Tobacco Use  ? Smoking status: Never  ? Smokeless tobacco: Never  ?Substance Use Topics  ? Alcohol use: No  ? ? ?Current Outpatient Medications:  ?  ALPRAZolam (XANAX) 0.5 MG tablet, Take 1 tablet (0.5 mg total) by mouth 2 (two) times daily. (Patient taking differently: Take 0.5 mg by mouth See admin instructions. Pt has recently been taking 0.'5mg'$  every 6-8 hours daily.), Disp: 30 tablet, Rfl: 3 ?  ARIPiprazole (ABILIFY) 10 MG tablet, Take 10 mg by mouth in the morning. , Disp: , Rfl:  ?  Armodafinil 250 MG tablet, Take 250 mg by mouth in the morning and at bedtime., Disp: , Rfl:  ?  Biotin 10 MG TABS, Take 10 mg by mouth in the morning. , Disp: , Rfl:  ?  buPROPion (WELLBUTRIN XL) 300 MG 24 hr tablet, TAKE 1 TABLET BY MOUTH EVERY DAY (Patient taking differently: Take 300 mg by mouth daily.), Disp: 30 tablet, Rfl: 2 ?  calcium citrate (CALCITRATE - DOSED IN MG ELEMENTAL CALCIUM) 950 MG tablet, Take 1 tablet by mouth daily., Disp: ,  Rfl:  ?  clonazePAM (KLONOPIN) 1 MG tablet, TAKE 1 TABLET BY MOUTH AT BEDTIME (Patient taking differently: Take 1 mg by mouth at bedtime.), Disp: 30 tablet, Rfl: 3 ?  cyclobenzaprine (FLEXERIL) 10 MG tablet, Take 2 tablets (20 mg total) by mouth 2 (two) times daily., Disp: 120 tablet, Rfl: 2 ?  diclofenac Sodium (VOLTAREN) 1 % GEL, Apply 4 g topically 4 (four) times daily., Disp: , Rfl:  ?  diphenhydrAMINE (BENADRYL) 25 MG tablet, Take  25 mg by mouth 2 (two) times daily as needed for allergies., Disp: , Rfl:  ?  DULoxetine (CYMBALTA) 60 MG capsule, Take 2 capsules (120 mg total) by mouth daily. (Patient taking differently: Take 60 mg by mouth 2 (two) times daily.), Disp: 240 capsule, Rfl: 2 ?  FEROSUL 325 (65 Fe) MG tablet, TAKE 1 TABLET BY MOUTH EVERY DAY WITH BREAKFAST (Patient taking differently: Take 325 mg by mouth daily with breakfast.), Disp: 30 tablet, Rfl: 2 ?  folic acid (FOLVITE) 1 MG tablet, TAKE 1 TABLET BY MOUTH EVERY DAY, Disp: 30 tablet, Rfl: 2 ?  furosemide (LASIX) 40 MG tablet, TAKE 1 TABLET BY MOUTH EVERY DAY MAY take additional TABLET IF weight INCREASE by 5lb AND call MD FOR further instructions (Patient taking differently: Take 40 mg by mouth daily. 1t qd-may take an additional tablet if weight increases by 5lbs.), Disp: 30 tablet, Rfl: 1 ?  gabapentin (NEURONTIN) 600 MG tablet, Take 1 tablet (600 mg total) by mouth 3 (three) times daily. TAKE 2 TABLETS BY MOUTH 3 TIMES DAILY (Patient taking differently: Take 600 mg by mouth 3 (three) times daily.), Disp: 270 tablet, Rfl: 0 ?  GNP VITAMIN B-1 100 MG tablet, TAKE 1 TABLET BY MOUTH EVERY DAY, Disp: 30 tablet, Rfl: 2 ?  HYDROcodone-acetaminophen (NORCO) 10-325 MG tablet, TAKE 1 TABLET BY MOUTH EVERY 6 HOURS AS NEEDED, Disp: 90 tablet, Rfl: 0 ?  nebivolol (BYSTOLIC) 10 MG tablet, TAKE 1 TABLET BY MOUTH EVERY DAY (Patient taking differently: Take 10 mg by mouth daily.), Disp: 30 tablet, Rfl: 2 ?  Oxcarbazepine (TRILEPTAL) 300 MG tablet, Take 300 mg by mouth 2 (two) times daily., Disp: , Rfl:  ?  pantoprazole (PROTONIX) 40 MG tablet, TAKE 1 TABLET BY MOUTH 2 TIMES DAILY (Patient taking differently: Take 40 mg by mouth 2 (two) times daily.), Disp: 60 tablet, Rfl: 2 ?  phentermine 37.5 MG capsule, TAKE 1 CAPSULE BY MOUTH EVERY MORNING, Disp: 90 capsule, Rfl: 0 ?  promethazine (PHENERGAN) 25 MG tablet, TAKE 1 TABLET BY MOUTH EVERY 6 HOURS AS NEEDED FOR NAUSEA AND vomiting (Patient  taking differently: Take 25 mg by mouth every 6 (six) hours as needed for nausea or vomiting.), Disp: 45 tablet, Rfl: 3 ?  Pyridoxine HCl (B-6) 100 MG TABS, TAKE 1 TABLET BY MOUTH EVERY DAY, Disp: 30 tablet, Rfl: 2 ?  rosuvastatin (CRESTOR) 20 MG tablet, TAKE 1 TABLET BY MOUTH EVERY DAY, Disp: 90 tablet, Rfl: 2 ?  senna-docusate (SENOKOT-S) 8.6-50 MG tablet, Take 1 tablet by mouth at bedtime. (Patient taking differently: Take 1 tablet by mouth daily as needed for mild constipation.), Disp: 30 tablet, Rfl: 0 ?  SKYRIZI 150 MG/ML SOSY, Inject 150 mg into the skin every 3 (three) months., Disp: , Rfl:  ?  vitamin B-12 (CYANOCOBALAMIN) 1000 MCG tablet, TAKE 1 TABLET BY MOUTH EVERY DAY, Disp: 30 tablet, Rfl: 2 ?  nystatin (MYCOSTATIN/NYSTOP) powder, APPLY 1 APPLICATION TOPICALLY 3 TIMES DAILY (Patient not taking: Reported on 09/14/2021), Disp:  60 g, Rfl: 3 ? ?Allergies  ?Allergen Reactions  ? Niacin Anaphylaxis, Shortness Of Breath and Swelling  ?  Swelling and "problems breathing"  ? Sulfamethoxazole-Trimethoprim Anaphylaxis, Swelling, Rash and Other (See Comments)  ?  Throat closed and the eyes became swollen  ? Aspirin Hives  ? Bactrim Hives  ? Benzoin Compound Rash  ? Cephalexin Hives  ?  Tolerated ceftriaxone and cefepime 07/2019  ? Clindamycin/Lincomycin Hives  ? Iohexol Hives  ?  Pt treated with PO benedryl  ? Lisinopril Hives  ? Naproxen Hives  ? Pnu-Imune [Pneumococcal Polysaccharide Vaccine] Rash  ? Sulfonamide Derivatives Rash  ? ? ?Objective:  ? ?BP 112/70   Pulse 98  ?AAOx3, NAD ?NCAT, EOMI ?No obvious CN deficits ?Pt is able to speak clearly, coherently without shortness of breath or increased work of breathing.  ?Thought process is linear.  Mood is appropriate.  ? ?Assessment and Plan:  ? ?Depression- deteriorated.  Pt has had multiple medical problems and hospitalizations since the fall.  She is mostly bedbound and has very few visitors.  She is lonely and feels 'like the walls are closing in'.  She feels  that her fatigue is more emotional than physical and doesn't feel that labs are indicated at this time.  We will increase her Abilify to '15mg'$  daily while continuing the Cymbalta and Wellbutrin.  If no improv

## 2021-09-15 ENCOUNTER — Other Ambulatory Visit: Payer: Self-pay | Admitting: Family Medicine

## 2021-09-15 NOTE — Telephone Encounter (Signed)
Patient is requesting a refill of the following medications: ?Requested Prescriptions  ? ?Pending Prescriptions Disp Refills  ? ALPRAZolam (XANAX) 0.5 MG tablet [Pharmacy Med Name: alprazolam 0.5 mg tablet] 30 tablet 3  ?  Sig: TAKE 1 TABLET BY MOUTH 2 TIMES DAILY  ? ? ?Date of patient request: 09/15/21 ?Last office visit: 09/14/21 ?Date of last refill: 06/22/21 ?Last refill amount: 30 ? ?

## 2021-09-15 NOTE — Telephone Encounter (Signed)
LM again, closing at this time due to lack of response  ?

## 2021-09-16 DIAGNOSIS — R238 Other skin changes: Secondary | ICD-10-CM | POA: Diagnosis not present

## 2021-09-16 DIAGNOSIS — M797 Fibromyalgia: Secondary | ICD-10-CM | POA: Diagnosis not present

## 2021-09-16 DIAGNOSIS — G4733 Obstructive sleep apnea (adult) (pediatric): Secondary | ICD-10-CM | POA: Diagnosis not present

## 2021-09-16 DIAGNOSIS — F32A Depression, unspecified: Secondary | ICD-10-CM | POA: Diagnosis not present

## 2021-09-16 DIAGNOSIS — M300 Polyarteritis nodosa: Secondary | ICD-10-CM | POA: Diagnosis not present

## 2021-09-16 DIAGNOSIS — G43909 Migraine, unspecified, not intractable, without status migrainosus: Secondary | ICD-10-CM | POA: Diagnosis not present

## 2021-09-16 DIAGNOSIS — I10 Essential (primary) hypertension: Secondary | ICD-10-CM | POA: Diagnosis not present

## 2021-09-16 DIAGNOSIS — G9529 Other cord compression: Secondary | ICD-10-CM | POA: Diagnosis not present

## 2021-09-16 DIAGNOSIS — M5136 Other intervertebral disc degeneration, lumbar region: Secondary | ICD-10-CM | POA: Diagnosis not present

## 2021-09-16 DIAGNOSIS — E785 Hyperlipidemia, unspecified: Secondary | ICD-10-CM | POA: Diagnosis not present

## 2021-09-16 DIAGNOSIS — E538 Deficiency of other specified B group vitamins: Secondary | ICD-10-CM | POA: Diagnosis not present

## 2021-09-16 DIAGNOSIS — G9589 Other specified diseases of spinal cord: Secondary | ICD-10-CM | POA: Diagnosis not present

## 2021-09-16 DIAGNOSIS — E559 Vitamin D deficiency, unspecified: Secondary | ICD-10-CM | POA: Diagnosis not present

## 2021-09-16 DIAGNOSIS — R350 Frequency of micturition: Secondary | ICD-10-CM | POA: Diagnosis not present

## 2021-09-16 DIAGNOSIS — I872 Venous insufficiency (chronic) (peripheral): Secondary | ICD-10-CM | POA: Diagnosis not present

## 2021-09-16 DIAGNOSIS — K760 Fatty (change of) liver, not elsewhere classified: Secondary | ICD-10-CM | POA: Diagnosis not present

## 2021-09-16 DIAGNOSIS — G629 Polyneuropathy, unspecified: Secondary | ICD-10-CM | POA: Diagnosis not present

## 2021-09-16 DIAGNOSIS — K259 Gastric ulcer, unspecified as acute or chronic, without hemorrhage or perforation: Secondary | ICD-10-CM | POA: Diagnosis not present

## 2021-09-16 DIAGNOSIS — N390 Urinary tract infection, site not specified: Secondary | ICD-10-CM | POA: Diagnosis not present

## 2021-09-16 DIAGNOSIS — L989 Disorder of the skin and subcutaneous tissue, unspecified: Secondary | ICD-10-CM | POA: Diagnosis not present

## 2021-09-16 DIAGNOSIS — K219 Gastro-esophageal reflux disease without esophagitis: Secondary | ICD-10-CM | POA: Diagnosis not present

## 2021-09-16 DIAGNOSIS — M4802 Spinal stenosis, cervical region: Secondary | ICD-10-CM | POA: Diagnosis not present

## 2021-09-20 ENCOUNTER — Telehealth: Payer: Self-pay | Admitting: Family Medicine

## 2021-09-20 NOTE — Telephone Encounter (Signed)
A man called in stating the # [phone] is not the correct # for the [employer].  ? ?He states that you have been calling him about this.  ?

## 2021-09-21 DIAGNOSIS — G629 Polyneuropathy, unspecified: Secondary | ICD-10-CM | POA: Diagnosis not present

## 2021-09-21 DIAGNOSIS — K760 Fatty (change of) liver, not elsewhere classified: Secondary | ICD-10-CM | POA: Diagnosis not present

## 2021-09-21 DIAGNOSIS — I872 Venous insufficiency (chronic) (peripheral): Secondary | ICD-10-CM | POA: Diagnosis not present

## 2021-09-21 DIAGNOSIS — E538 Deficiency of other specified B group vitamins: Secondary | ICD-10-CM | POA: Diagnosis not present

## 2021-09-21 DIAGNOSIS — R350 Frequency of micturition: Secondary | ICD-10-CM | POA: Diagnosis not present

## 2021-09-21 DIAGNOSIS — M797 Fibromyalgia: Secondary | ICD-10-CM | POA: Diagnosis not present

## 2021-09-21 DIAGNOSIS — G9589 Other specified diseases of spinal cord: Secondary | ICD-10-CM | POA: Diagnosis not present

## 2021-09-21 DIAGNOSIS — M5136 Other intervertebral disc degeneration, lumbar region: Secondary | ICD-10-CM | POA: Diagnosis not present

## 2021-09-21 DIAGNOSIS — E559 Vitamin D deficiency, unspecified: Secondary | ICD-10-CM | POA: Diagnosis not present

## 2021-09-21 DIAGNOSIS — L989 Disorder of the skin and subcutaneous tissue, unspecified: Secondary | ICD-10-CM | POA: Diagnosis not present

## 2021-09-21 DIAGNOSIS — E785 Hyperlipidemia, unspecified: Secondary | ICD-10-CM | POA: Diagnosis not present

## 2021-09-21 DIAGNOSIS — N390 Urinary tract infection, site not specified: Secondary | ICD-10-CM | POA: Diagnosis not present

## 2021-09-21 DIAGNOSIS — G43909 Migraine, unspecified, not intractable, without status migrainosus: Secondary | ICD-10-CM | POA: Diagnosis not present

## 2021-09-21 DIAGNOSIS — K219 Gastro-esophageal reflux disease without esophagitis: Secondary | ICD-10-CM | POA: Diagnosis not present

## 2021-09-21 DIAGNOSIS — R238 Other skin changes: Secondary | ICD-10-CM | POA: Diagnosis not present

## 2021-09-21 DIAGNOSIS — I10 Essential (primary) hypertension: Secondary | ICD-10-CM | POA: Diagnosis not present

## 2021-09-21 DIAGNOSIS — G9529 Other cord compression: Secondary | ICD-10-CM | POA: Diagnosis not present

## 2021-09-21 DIAGNOSIS — K259 Gastric ulcer, unspecified as acute or chronic, without hemorrhage or perforation: Secondary | ICD-10-CM | POA: Diagnosis not present

## 2021-09-21 DIAGNOSIS — G4733 Obstructive sleep apnea (adult) (pediatric): Secondary | ICD-10-CM | POA: Diagnosis not present

## 2021-09-21 DIAGNOSIS — F32A Depression, unspecified: Secondary | ICD-10-CM | POA: Diagnosis not present

## 2021-09-21 DIAGNOSIS — M300 Polyarteritis nodosa: Secondary | ICD-10-CM | POA: Diagnosis not present

## 2021-09-21 DIAGNOSIS — M4802 Spinal stenosis, cervical region: Secondary | ICD-10-CM | POA: Diagnosis not present

## 2021-09-21 NOTE — Telephone Encounter (Signed)
Called the provided number and technician was able to help, in order to get a scooter they need 2 written Rx one for the scooter with the diagnosis notated on the prescription as well as one written for OTPT evaluation.  ? ?Dr Birdie Riddle if you can write the two prescriptions on Rx pad we can collect the other data and get this sent out.  ? ? ?These need to be sent along with insurance and demographic data as well as office note that supports need of device and this can all be faxed to [phone] ? ? ?

## 2021-09-21 NOTE — Telephone Encounter (Signed)
Pt  called in checking on the scooter, please call [phone] or call [phone] ?

## 2021-09-21 NOTE — Telephone Encounter (Signed)
This was the number supplied for supply in previous message  ?

## 2021-09-23 DIAGNOSIS — K760 Fatty (change of) liver, not elsewhere classified: Secondary | ICD-10-CM | POA: Diagnosis not present

## 2021-09-23 DIAGNOSIS — R238 Other skin changes: Secondary | ICD-10-CM | POA: Diagnosis not present

## 2021-09-23 DIAGNOSIS — G43909 Migraine, unspecified, not intractable, without status migrainosus: Secondary | ICD-10-CM | POA: Diagnosis not present

## 2021-09-23 DIAGNOSIS — I872 Venous insufficiency (chronic) (peripheral): Secondary | ICD-10-CM | POA: Diagnosis not present

## 2021-09-23 DIAGNOSIS — F32A Depression, unspecified: Secondary | ICD-10-CM | POA: Diagnosis not present

## 2021-09-23 DIAGNOSIS — N390 Urinary tract infection, site not specified: Secondary | ICD-10-CM | POA: Diagnosis not present

## 2021-09-23 DIAGNOSIS — K259 Gastric ulcer, unspecified as acute or chronic, without hemorrhage or perforation: Secondary | ICD-10-CM | POA: Diagnosis not present

## 2021-09-23 DIAGNOSIS — M300 Polyarteritis nodosa: Secondary | ICD-10-CM | POA: Diagnosis not present

## 2021-09-23 DIAGNOSIS — M4802 Spinal stenosis, cervical region: Secondary | ICD-10-CM | POA: Diagnosis not present

## 2021-09-23 DIAGNOSIS — E785 Hyperlipidemia, unspecified: Secondary | ICD-10-CM | POA: Diagnosis not present

## 2021-09-23 DIAGNOSIS — R350 Frequency of micturition: Secondary | ICD-10-CM | POA: Diagnosis not present

## 2021-09-23 DIAGNOSIS — M5136 Other intervertebral disc degeneration, lumbar region: Secondary | ICD-10-CM | POA: Diagnosis not present

## 2021-09-23 DIAGNOSIS — E559 Vitamin D deficiency, unspecified: Secondary | ICD-10-CM | POA: Diagnosis not present

## 2021-09-23 DIAGNOSIS — E538 Deficiency of other specified B group vitamins: Secondary | ICD-10-CM | POA: Diagnosis not present

## 2021-09-23 DIAGNOSIS — I10 Essential (primary) hypertension: Secondary | ICD-10-CM | POA: Diagnosis not present

## 2021-09-23 DIAGNOSIS — L989 Disorder of the skin and subcutaneous tissue, unspecified: Secondary | ICD-10-CM | POA: Diagnosis not present

## 2021-09-23 DIAGNOSIS — G629 Polyneuropathy, unspecified: Secondary | ICD-10-CM | POA: Diagnosis not present

## 2021-09-23 DIAGNOSIS — K219 Gastro-esophageal reflux disease without esophagitis: Secondary | ICD-10-CM | POA: Diagnosis not present

## 2021-09-23 DIAGNOSIS — G9589 Other specified diseases of spinal cord: Secondary | ICD-10-CM | POA: Diagnosis not present

## 2021-09-23 DIAGNOSIS — G4733 Obstructive sleep apnea (adult) (pediatric): Secondary | ICD-10-CM | POA: Diagnosis not present

## 2021-09-23 DIAGNOSIS — M797 Fibromyalgia: Secondary | ICD-10-CM | POA: Diagnosis not present

## 2021-09-23 DIAGNOSIS — G9529 Other cord compression: Secondary | ICD-10-CM | POA: Diagnosis not present

## 2021-09-24 DIAGNOSIS — R350 Frequency of micturition: Secondary | ICD-10-CM | POA: Diagnosis not present

## 2021-09-24 DIAGNOSIS — L989 Disorder of the skin and subcutaneous tissue, unspecified: Secondary | ICD-10-CM | POA: Diagnosis not present

## 2021-09-24 DIAGNOSIS — F32A Depression, unspecified: Secondary | ICD-10-CM | POA: Diagnosis not present

## 2021-09-24 DIAGNOSIS — M797 Fibromyalgia: Secondary | ICD-10-CM | POA: Diagnosis not present

## 2021-09-24 DIAGNOSIS — E559 Vitamin D deficiency, unspecified: Secondary | ICD-10-CM | POA: Diagnosis not present

## 2021-09-24 DIAGNOSIS — M5136 Other intervertebral disc degeneration, lumbar region: Secondary | ICD-10-CM | POA: Diagnosis not present

## 2021-09-24 DIAGNOSIS — K219 Gastro-esophageal reflux disease without esophagitis: Secondary | ICD-10-CM | POA: Diagnosis not present

## 2021-09-24 DIAGNOSIS — E785 Hyperlipidemia, unspecified: Secondary | ICD-10-CM | POA: Diagnosis not present

## 2021-09-24 DIAGNOSIS — I10 Essential (primary) hypertension: Secondary | ICD-10-CM | POA: Diagnosis not present

## 2021-09-24 DIAGNOSIS — M4802 Spinal stenosis, cervical region: Secondary | ICD-10-CM | POA: Diagnosis not present

## 2021-09-24 DIAGNOSIS — I872 Venous insufficiency (chronic) (peripheral): Secondary | ICD-10-CM | POA: Diagnosis not present

## 2021-09-24 DIAGNOSIS — K760 Fatty (change of) liver, not elsewhere classified: Secondary | ICD-10-CM | POA: Diagnosis not present

## 2021-09-24 DIAGNOSIS — M300 Polyarteritis nodosa: Secondary | ICD-10-CM | POA: Diagnosis not present

## 2021-09-24 DIAGNOSIS — G9589 Other specified diseases of spinal cord: Secondary | ICD-10-CM | POA: Diagnosis not present

## 2021-09-24 DIAGNOSIS — G9529 Other cord compression: Secondary | ICD-10-CM | POA: Diagnosis not present

## 2021-09-24 DIAGNOSIS — G629 Polyneuropathy, unspecified: Secondary | ICD-10-CM | POA: Diagnosis not present

## 2021-09-24 DIAGNOSIS — G43909 Migraine, unspecified, not intractable, without status migrainosus: Secondary | ICD-10-CM | POA: Diagnosis not present

## 2021-09-24 DIAGNOSIS — G4733 Obstructive sleep apnea (adult) (pediatric): Secondary | ICD-10-CM | POA: Diagnosis not present

## 2021-09-24 DIAGNOSIS — K259 Gastric ulcer, unspecified as acute or chronic, without hemorrhage or perforation: Secondary | ICD-10-CM | POA: Diagnosis not present

## 2021-09-24 DIAGNOSIS — E538 Deficiency of other specified B group vitamins: Secondary | ICD-10-CM | POA: Diagnosis not present

## 2021-09-24 DIAGNOSIS — N390 Urinary tract infection, site not specified: Secondary | ICD-10-CM | POA: Diagnosis not present

## 2021-09-24 DIAGNOSIS — R238 Other skin changes: Secondary | ICD-10-CM | POA: Diagnosis not present

## 2021-09-27 ENCOUNTER — Other Ambulatory Visit: Payer: Self-pay | Admitting: Family Medicine

## 2021-09-27 DIAGNOSIS — K219 Gastro-esophageal reflux disease without esophagitis: Secondary | ICD-10-CM | POA: Diagnosis not present

## 2021-09-27 DIAGNOSIS — E538 Deficiency of other specified B group vitamins: Secondary | ICD-10-CM | POA: Diagnosis not present

## 2021-09-27 DIAGNOSIS — I872 Venous insufficiency (chronic) (peripheral): Secondary | ICD-10-CM | POA: Diagnosis not present

## 2021-09-27 DIAGNOSIS — M797 Fibromyalgia: Secondary | ICD-10-CM | POA: Diagnosis not present

## 2021-09-27 DIAGNOSIS — K259 Gastric ulcer, unspecified as acute or chronic, without hemorrhage or perforation: Secondary | ICD-10-CM | POA: Diagnosis not present

## 2021-09-27 DIAGNOSIS — G43909 Migraine, unspecified, not intractable, without status migrainosus: Secondary | ICD-10-CM | POA: Diagnosis not present

## 2021-09-27 DIAGNOSIS — G4733 Obstructive sleep apnea (adult) (pediatric): Secondary | ICD-10-CM | POA: Diagnosis not present

## 2021-09-27 DIAGNOSIS — M4802 Spinal stenosis, cervical region: Secondary | ICD-10-CM | POA: Diagnosis not present

## 2021-09-27 DIAGNOSIS — M5136 Other intervertebral disc degeneration, lumbar region: Secondary | ICD-10-CM | POA: Diagnosis not present

## 2021-09-27 DIAGNOSIS — G9529 Other cord compression: Secondary | ICD-10-CM | POA: Diagnosis not present

## 2021-09-27 DIAGNOSIS — K760 Fatty (change of) liver, not elsewhere classified: Secondary | ICD-10-CM | POA: Diagnosis not present

## 2021-09-27 DIAGNOSIS — G9589 Other specified diseases of spinal cord: Secondary | ICD-10-CM | POA: Diagnosis not present

## 2021-09-27 DIAGNOSIS — E785 Hyperlipidemia, unspecified: Secondary | ICD-10-CM | POA: Diagnosis not present

## 2021-09-27 DIAGNOSIS — N390 Urinary tract infection, site not specified: Secondary | ICD-10-CM | POA: Diagnosis not present

## 2021-09-27 DIAGNOSIS — R350 Frequency of micturition: Secondary | ICD-10-CM | POA: Diagnosis not present

## 2021-09-27 DIAGNOSIS — L989 Disorder of the skin and subcutaneous tissue, unspecified: Secondary | ICD-10-CM | POA: Diagnosis not present

## 2021-09-27 DIAGNOSIS — F32A Depression, unspecified: Secondary | ICD-10-CM | POA: Diagnosis not present

## 2021-09-27 DIAGNOSIS — G629 Polyneuropathy, unspecified: Secondary | ICD-10-CM | POA: Diagnosis not present

## 2021-09-27 DIAGNOSIS — M300 Polyarteritis nodosa: Secondary | ICD-10-CM | POA: Diagnosis not present

## 2021-09-27 DIAGNOSIS — R238 Other skin changes: Secondary | ICD-10-CM | POA: Diagnosis not present

## 2021-09-27 DIAGNOSIS — E559 Vitamin D deficiency, unspecified: Secondary | ICD-10-CM | POA: Diagnosis not present

## 2021-09-27 DIAGNOSIS — I10 Essential (primary) hypertension: Secondary | ICD-10-CM | POA: Diagnosis not present

## 2021-09-27 NOTE — Telephone Encounter (Signed)
Patient is requesting a refill of the following medications: ?Requested Prescriptions  ? ?Pending Prescriptions Disp Refills  ? phentermine 37.5 MG capsule [Pharmacy Med Name: phentermine 37.5 mg capsule] 90 capsule 0  ?  Sig: TAKE 1 CAPSULE BY MOUTH EVERY MORNING  ? ? ?Date of patient request: 09/27/2021 ?Last office visit: 08/10/2021 video visit ?Date of last refill: 08/19/2021 ?Last refill amount: 90 capsules  ?Follow up time period per chart: none  ?

## 2021-09-28 DIAGNOSIS — E785 Hyperlipidemia, unspecified: Secondary | ICD-10-CM | POA: Diagnosis not present

## 2021-09-28 DIAGNOSIS — R350 Frequency of micturition: Secondary | ICD-10-CM | POA: Diagnosis not present

## 2021-09-28 DIAGNOSIS — G9529 Other cord compression: Secondary | ICD-10-CM | POA: Diagnosis not present

## 2021-09-28 DIAGNOSIS — K760 Fatty (change of) liver, not elsewhere classified: Secondary | ICD-10-CM | POA: Diagnosis not present

## 2021-09-28 DIAGNOSIS — E559 Vitamin D deficiency, unspecified: Secondary | ICD-10-CM | POA: Diagnosis not present

## 2021-09-28 DIAGNOSIS — M5136 Other intervertebral disc degeneration, lumbar region: Secondary | ICD-10-CM | POA: Diagnosis not present

## 2021-09-28 DIAGNOSIS — G9589 Other specified diseases of spinal cord: Secondary | ICD-10-CM | POA: Diagnosis not present

## 2021-09-28 DIAGNOSIS — G4733 Obstructive sleep apnea (adult) (pediatric): Secondary | ICD-10-CM | POA: Diagnosis not present

## 2021-09-28 DIAGNOSIS — I10 Essential (primary) hypertension: Secondary | ICD-10-CM | POA: Diagnosis not present

## 2021-09-28 DIAGNOSIS — N390 Urinary tract infection, site not specified: Secondary | ICD-10-CM | POA: Diagnosis not present

## 2021-09-28 DIAGNOSIS — F32A Depression, unspecified: Secondary | ICD-10-CM | POA: Diagnosis not present

## 2021-09-28 DIAGNOSIS — R238 Other skin changes: Secondary | ICD-10-CM | POA: Diagnosis not present

## 2021-09-28 DIAGNOSIS — M797 Fibromyalgia: Secondary | ICD-10-CM | POA: Diagnosis not present

## 2021-09-28 DIAGNOSIS — M4802 Spinal stenosis, cervical region: Secondary | ICD-10-CM | POA: Diagnosis not present

## 2021-09-28 DIAGNOSIS — M300 Polyarteritis nodosa: Secondary | ICD-10-CM | POA: Diagnosis not present

## 2021-09-28 DIAGNOSIS — L989 Disorder of the skin and subcutaneous tissue, unspecified: Secondary | ICD-10-CM | POA: Diagnosis not present

## 2021-09-28 DIAGNOSIS — E538 Deficiency of other specified B group vitamins: Secondary | ICD-10-CM | POA: Diagnosis not present

## 2021-09-28 DIAGNOSIS — K219 Gastro-esophageal reflux disease without esophagitis: Secondary | ICD-10-CM | POA: Diagnosis not present

## 2021-09-28 DIAGNOSIS — G629 Polyneuropathy, unspecified: Secondary | ICD-10-CM | POA: Diagnosis not present

## 2021-09-28 DIAGNOSIS — K259 Gastric ulcer, unspecified as acute or chronic, without hemorrhage or perforation: Secondary | ICD-10-CM | POA: Diagnosis not present

## 2021-09-28 DIAGNOSIS — I872 Venous insufficiency (chronic) (peripheral): Secondary | ICD-10-CM | POA: Diagnosis not present

## 2021-09-28 DIAGNOSIS — G43909 Migraine, unspecified, not intractable, without status migrainosus: Secondary | ICD-10-CM | POA: Diagnosis not present

## 2021-09-30 ENCOUNTER — Other Ambulatory Visit: Payer: Self-pay | Admitting: Family Medicine

## 2021-09-30 NOTE — Telephone Encounter (Signed)
Spoke w/ pt and advised the RX has been sent to pharmacy

## 2021-10-01 DIAGNOSIS — M4802 Spinal stenosis, cervical region: Secondary | ICD-10-CM | POA: Diagnosis not present

## 2021-10-01 DIAGNOSIS — E538 Deficiency of other specified B group vitamins: Secondary | ICD-10-CM | POA: Diagnosis not present

## 2021-10-01 DIAGNOSIS — E785 Hyperlipidemia, unspecified: Secondary | ICD-10-CM | POA: Diagnosis not present

## 2021-10-01 DIAGNOSIS — M5136 Other intervertebral disc degeneration, lumbar region: Secondary | ICD-10-CM | POA: Diagnosis not present

## 2021-10-01 DIAGNOSIS — G9529 Other cord compression: Secondary | ICD-10-CM | POA: Diagnosis not present

## 2021-10-01 DIAGNOSIS — G9589 Other specified diseases of spinal cord: Secondary | ICD-10-CM | POA: Diagnosis not present

## 2021-10-01 DIAGNOSIS — E559 Vitamin D deficiency, unspecified: Secondary | ICD-10-CM | POA: Diagnosis not present

## 2021-10-01 DIAGNOSIS — R350 Frequency of micturition: Secondary | ICD-10-CM | POA: Diagnosis not present

## 2021-10-01 DIAGNOSIS — G43909 Migraine, unspecified, not intractable, without status migrainosus: Secondary | ICD-10-CM | POA: Diagnosis not present

## 2021-10-01 DIAGNOSIS — M797 Fibromyalgia: Secondary | ICD-10-CM | POA: Diagnosis not present

## 2021-10-01 DIAGNOSIS — I10 Essential (primary) hypertension: Secondary | ICD-10-CM | POA: Diagnosis not present

## 2021-10-01 DIAGNOSIS — F32A Depression, unspecified: Secondary | ICD-10-CM | POA: Diagnosis not present

## 2021-10-01 DIAGNOSIS — I872 Venous insufficiency (chronic) (peripheral): Secondary | ICD-10-CM | POA: Diagnosis not present

## 2021-10-01 DIAGNOSIS — G4733 Obstructive sleep apnea (adult) (pediatric): Secondary | ICD-10-CM | POA: Diagnosis not present

## 2021-10-01 DIAGNOSIS — N390 Urinary tract infection, site not specified: Secondary | ICD-10-CM | POA: Diagnosis not present

## 2021-10-01 DIAGNOSIS — K259 Gastric ulcer, unspecified as acute or chronic, without hemorrhage or perforation: Secondary | ICD-10-CM | POA: Diagnosis not present

## 2021-10-01 DIAGNOSIS — G629 Polyneuropathy, unspecified: Secondary | ICD-10-CM | POA: Diagnosis not present

## 2021-10-01 DIAGNOSIS — L989 Disorder of the skin and subcutaneous tissue, unspecified: Secondary | ICD-10-CM | POA: Diagnosis not present

## 2021-10-01 DIAGNOSIS — K219 Gastro-esophageal reflux disease without esophagitis: Secondary | ICD-10-CM | POA: Diagnosis not present

## 2021-10-01 DIAGNOSIS — K760 Fatty (change of) liver, not elsewhere classified: Secondary | ICD-10-CM | POA: Diagnosis not present

## 2021-10-01 DIAGNOSIS — M300 Polyarteritis nodosa: Secondary | ICD-10-CM | POA: Diagnosis not present

## 2021-10-01 DIAGNOSIS — R238 Other skin changes: Secondary | ICD-10-CM | POA: Diagnosis not present

## 2021-10-04 DIAGNOSIS — E559 Vitamin D deficiency, unspecified: Secondary | ICD-10-CM | POA: Diagnosis not present

## 2021-10-04 DIAGNOSIS — L989 Disorder of the skin and subcutaneous tissue, unspecified: Secondary | ICD-10-CM | POA: Diagnosis not present

## 2021-10-04 DIAGNOSIS — M300 Polyarteritis nodosa: Secondary | ICD-10-CM | POA: Diagnosis not present

## 2021-10-04 DIAGNOSIS — K219 Gastro-esophageal reflux disease without esophagitis: Secondary | ICD-10-CM | POA: Diagnosis not present

## 2021-10-04 DIAGNOSIS — F32A Depression, unspecified: Secondary | ICD-10-CM | POA: Diagnosis not present

## 2021-10-04 DIAGNOSIS — E785 Hyperlipidemia, unspecified: Secondary | ICD-10-CM | POA: Diagnosis not present

## 2021-10-04 DIAGNOSIS — I872 Venous insufficiency (chronic) (peripheral): Secondary | ICD-10-CM | POA: Diagnosis not present

## 2021-10-04 DIAGNOSIS — R350 Frequency of micturition: Secondary | ICD-10-CM | POA: Diagnosis not present

## 2021-10-04 DIAGNOSIS — G629 Polyneuropathy, unspecified: Secondary | ICD-10-CM | POA: Diagnosis not present

## 2021-10-04 DIAGNOSIS — I10 Essential (primary) hypertension: Secondary | ICD-10-CM | POA: Diagnosis not present

## 2021-10-04 DIAGNOSIS — G4733 Obstructive sleep apnea (adult) (pediatric): Secondary | ICD-10-CM | POA: Diagnosis not present

## 2021-10-04 DIAGNOSIS — G9529 Other cord compression: Secondary | ICD-10-CM | POA: Diagnosis not present

## 2021-10-04 DIAGNOSIS — M797 Fibromyalgia: Secondary | ICD-10-CM | POA: Diagnosis not present

## 2021-10-04 DIAGNOSIS — R238 Other skin changes: Secondary | ICD-10-CM | POA: Diagnosis not present

## 2021-10-04 DIAGNOSIS — K259 Gastric ulcer, unspecified as acute or chronic, without hemorrhage or perforation: Secondary | ICD-10-CM | POA: Diagnosis not present

## 2021-10-04 DIAGNOSIS — M4802 Spinal stenosis, cervical region: Secondary | ICD-10-CM | POA: Diagnosis not present

## 2021-10-04 DIAGNOSIS — K760 Fatty (change of) liver, not elsewhere classified: Secondary | ICD-10-CM | POA: Diagnosis not present

## 2021-10-04 DIAGNOSIS — N390 Urinary tract infection, site not specified: Secondary | ICD-10-CM | POA: Diagnosis not present

## 2021-10-04 DIAGNOSIS — G9589 Other specified diseases of spinal cord: Secondary | ICD-10-CM | POA: Diagnosis not present

## 2021-10-04 DIAGNOSIS — G43909 Migraine, unspecified, not intractable, without status migrainosus: Secondary | ICD-10-CM | POA: Diagnosis not present

## 2021-10-04 DIAGNOSIS — E538 Deficiency of other specified B group vitamins: Secondary | ICD-10-CM | POA: Diagnosis not present

## 2021-10-04 DIAGNOSIS — M5136 Other intervertebral disc degeneration, lumbar region: Secondary | ICD-10-CM | POA: Diagnosis not present

## 2021-10-04 NOTE — Telephone Encounter (Signed)
Rx for scooter has been faxed to [phone] along w/ office notes . Pt is aware .

## 2021-10-04 NOTE — Telephone Encounter (Signed)
Spoke w/ pt and advised pt that prescription has been placed in bin for additional and we will send in.

## 2021-10-04 NOTE — Telephone Encounter (Signed)
Patent is calling in stating she wants an update.

## 2021-10-04 NOTE — Telephone Encounter (Signed)
Prescription written and placed in bin for additional attachments

## 2021-10-05 ENCOUNTER — Other Ambulatory Visit: Payer: Self-pay | Admitting: Family Medicine

## 2021-10-05 DIAGNOSIS — I872 Venous insufficiency (chronic) (peripheral): Secondary | ICD-10-CM | POA: Diagnosis not present

## 2021-10-05 DIAGNOSIS — I10 Essential (primary) hypertension: Secondary | ICD-10-CM | POA: Diagnosis not present

## 2021-10-05 DIAGNOSIS — F32A Depression, unspecified: Secondary | ICD-10-CM | POA: Diagnosis not present

## 2021-10-05 DIAGNOSIS — K259 Gastric ulcer, unspecified as acute or chronic, without hemorrhage or perforation: Secondary | ICD-10-CM | POA: Diagnosis not present

## 2021-10-05 DIAGNOSIS — L989 Disorder of the skin and subcutaneous tissue, unspecified: Secondary | ICD-10-CM | POA: Diagnosis not present

## 2021-10-05 DIAGNOSIS — K760 Fatty (change of) liver, not elsewhere classified: Secondary | ICD-10-CM | POA: Diagnosis not present

## 2021-10-05 DIAGNOSIS — K219 Gastro-esophageal reflux disease without esophagitis: Secondary | ICD-10-CM | POA: Diagnosis not present

## 2021-10-05 DIAGNOSIS — G4733 Obstructive sleep apnea (adult) (pediatric): Secondary | ICD-10-CM | POA: Diagnosis not present

## 2021-10-05 DIAGNOSIS — E785 Hyperlipidemia, unspecified: Secondary | ICD-10-CM | POA: Diagnosis not present

## 2021-10-05 DIAGNOSIS — G43909 Migraine, unspecified, not intractable, without status migrainosus: Secondary | ICD-10-CM | POA: Diagnosis not present

## 2021-10-05 DIAGNOSIS — G9589 Other specified diseases of spinal cord: Secondary | ICD-10-CM | POA: Diagnosis not present

## 2021-10-05 DIAGNOSIS — R350 Frequency of micturition: Secondary | ICD-10-CM | POA: Diagnosis not present

## 2021-10-05 DIAGNOSIS — E538 Deficiency of other specified B group vitamins: Secondary | ICD-10-CM | POA: Diagnosis not present

## 2021-10-05 DIAGNOSIS — N390 Urinary tract infection, site not specified: Secondary | ICD-10-CM | POA: Diagnosis not present

## 2021-10-05 DIAGNOSIS — M797 Fibromyalgia: Secondary | ICD-10-CM | POA: Diagnosis not present

## 2021-10-05 DIAGNOSIS — R238 Other skin changes: Secondary | ICD-10-CM | POA: Diagnosis not present

## 2021-10-05 DIAGNOSIS — G9529 Other cord compression: Secondary | ICD-10-CM | POA: Diagnosis not present

## 2021-10-05 DIAGNOSIS — M300 Polyarteritis nodosa: Secondary | ICD-10-CM | POA: Diagnosis not present

## 2021-10-05 DIAGNOSIS — M4802 Spinal stenosis, cervical region: Secondary | ICD-10-CM | POA: Diagnosis not present

## 2021-10-05 DIAGNOSIS — M5136 Other intervertebral disc degeneration, lumbar region: Secondary | ICD-10-CM | POA: Diagnosis not present

## 2021-10-05 DIAGNOSIS — E559 Vitamin D deficiency, unspecified: Secondary | ICD-10-CM | POA: Diagnosis not present

## 2021-10-05 DIAGNOSIS — G629 Polyneuropathy, unspecified: Secondary | ICD-10-CM | POA: Diagnosis not present

## 2021-10-06 ENCOUNTER — Telehealth: Payer: Self-pay | Admitting: Family Medicine

## 2021-10-06 MED ORDER — PHENTERMINE HCL 37.5 MG PO CAPS: 37.5000 mg | ORAL_CAPSULE | Freq: Every morning | ORAL | 2 refills | Status: DC

## 2021-10-06 NOTE — Telephone Encounter (Signed)
Pt called in stating that phentermine can't be filled for 90 days they can only do 30 days and told her it was to soon for a refill.  Please advise   Pt is out because they only gave her 30 tabs on 04/06 [facility]

## 2021-10-06 NOTE — Telephone Encounter (Signed)
Prescription has been sent for 30 since she is only able to get 30 filled

## 2021-10-07 DIAGNOSIS — K259 Gastric ulcer, unspecified as acute or chronic, without hemorrhage or perforation: Secondary | ICD-10-CM | POA: Diagnosis not present

## 2021-10-07 DIAGNOSIS — N390 Urinary tract infection, site not specified: Secondary | ICD-10-CM | POA: Diagnosis not present

## 2021-10-07 DIAGNOSIS — G9529 Other cord compression: Secondary | ICD-10-CM | POA: Diagnosis not present

## 2021-10-07 DIAGNOSIS — E785 Hyperlipidemia, unspecified: Secondary | ICD-10-CM | POA: Diagnosis not present

## 2021-10-07 DIAGNOSIS — K760 Fatty (change of) liver, not elsewhere classified: Secondary | ICD-10-CM | POA: Diagnosis not present

## 2021-10-07 DIAGNOSIS — M5136 Other intervertebral disc degeneration, lumbar region: Secondary | ICD-10-CM | POA: Diagnosis not present

## 2021-10-07 DIAGNOSIS — E538 Deficiency of other specified B group vitamins: Secondary | ICD-10-CM | POA: Diagnosis not present

## 2021-10-07 DIAGNOSIS — R350 Frequency of micturition: Secondary | ICD-10-CM | POA: Diagnosis not present

## 2021-10-07 DIAGNOSIS — G43909 Migraine, unspecified, not intractable, without status migrainosus: Secondary | ICD-10-CM | POA: Diagnosis not present

## 2021-10-07 DIAGNOSIS — F32A Depression, unspecified: Secondary | ICD-10-CM | POA: Diagnosis not present

## 2021-10-07 DIAGNOSIS — I872 Venous insufficiency (chronic) (peripheral): Secondary | ICD-10-CM | POA: Diagnosis not present

## 2021-10-07 DIAGNOSIS — A419 Sepsis, unspecified organism: Secondary | ICD-10-CM | POA: Diagnosis not present

## 2021-10-07 DIAGNOSIS — G629 Polyneuropathy, unspecified: Secondary | ICD-10-CM | POA: Diagnosis not present

## 2021-10-07 DIAGNOSIS — G4733 Obstructive sleep apnea (adult) (pediatric): Secondary | ICD-10-CM | POA: Diagnosis not present

## 2021-10-07 DIAGNOSIS — I10 Essential (primary) hypertension: Secondary | ICD-10-CM | POA: Diagnosis not present

## 2021-10-07 DIAGNOSIS — E871 Hypo-osmolality and hyponatremia: Secondary | ICD-10-CM | POA: Diagnosis not present

## 2021-10-07 DIAGNOSIS — E559 Vitamin D deficiency, unspecified: Secondary | ICD-10-CM | POA: Diagnosis not present

## 2021-10-07 DIAGNOSIS — K219 Gastro-esophageal reflux disease without esophagitis: Secondary | ICD-10-CM | POA: Diagnosis not present

## 2021-10-07 DIAGNOSIS — L989 Disorder of the skin and subcutaneous tissue, unspecified: Secondary | ICD-10-CM | POA: Diagnosis not present

## 2021-10-07 DIAGNOSIS — M300 Polyarteritis nodosa: Secondary | ICD-10-CM | POA: Diagnosis not present

## 2021-10-07 DIAGNOSIS — R238 Other skin changes: Secondary | ICD-10-CM | POA: Diagnosis not present

## 2021-10-07 DIAGNOSIS — M4802 Spinal stenosis, cervical region: Secondary | ICD-10-CM | POA: Diagnosis not present

## 2021-10-07 DIAGNOSIS — M797 Fibromyalgia: Secondary | ICD-10-CM | POA: Diagnosis not present

## 2021-10-07 DIAGNOSIS — G9589 Other specified diseases of spinal cord: Secondary | ICD-10-CM | POA: Diagnosis not present

## 2021-10-12 ENCOUNTER — Telehealth: Payer: Self-pay | Admitting: Family Medicine

## 2021-10-12 NOTE — Telephone Encounter (Signed)
Called back and gave verbal orders

## 2021-10-12 NOTE — Telephone Encounter (Signed)
Physical therapist called in asking for verbal orders to see pt for PT 2X a wk for 3 wks and 1X a wk for 6wks.  Ok to [facility] if no answer at [phone]

## 2021-10-15 NOTE — Unmapped (Signed)
[facility] Refill Coordination Note    Specialty Medication(s) to be Shipped:   Inflammatory Disorders: Skyrizi    Other medication(s) to be shipped: No additional medications requested for fill at this time     Autumn Mcdonald, DOB: 1970-07-31  Phone: [phone] (home)       All above HIPAA information was verified with patient.     Was a Nurse, learning disability used for this call? No    Completed refill call assessment today to schedule patient's medication shipment from the [facility] [phone]).  All relevant notes have been reviewed.     Specialty medication(s) and dose(s) confirmed: Regimen is correct and unchanged.   Changes to medications: Janara reports no changes at this time.  Changes to insurance: No  New side effects reported not previously addressed with a pharmacist or physician: None reported  Questions for the pharmacist: No    Confirmed patient received a Conservation officer, historic buildings and a Surveyor, mining with first shipment. The patient will receive a drug information handout for each medication shipped and additional FDA Medication Guides as required.       DISEASE/MEDICATION-SPECIFIC INFORMATION        For patients on injectable medications: Patient currently has 0 doses left.  Next injection is scheduled for 10/28/2021.    SPECIALTY MEDICATION ADHERENCE     Medication Adherence    Patient reported X missed doses in the last month: 0  Specialty Medication: Skyrizi  Patient is on additional specialty medications: No  Any gaps in refill history greater than 2 weeks in the last 3 months: no  Demonstrates understanding of importance of adherence: yes  Informant: patient  Reliability of informant: reliable  Confirmed plan for next specialty medication refill: delivery by pharmacy  Refills needed for supportive medications: not needed              Were doses missed due to medication being on hold? No    Skyrizi 150 mg/ml: 0 days of medicine on hand       REFERRAL TO PHARMACIST Referral to the pharmacist: Not needed      [facility]     Shipping address confirmed in Epic.     Delivery Scheduled: Yes, Expected medication delivery date: 10/21/2021.     Medication will be delivered via UPS to the prescription address in Epic WAM.    Florie Carico D Michaeline Eckersley   [facility] Specialty Technician

## 2021-10-16 DIAGNOSIS — K760 Fatty (change of) liver, not elsewhere classified: Secondary | ICD-10-CM | POA: Diagnosis not present

## 2021-10-16 DIAGNOSIS — I10 Essential (primary) hypertension: Secondary | ICD-10-CM | POA: Diagnosis not present

## 2021-10-16 DIAGNOSIS — Z87440 Personal history of urinary (tract) infections: Secondary | ICD-10-CM | POA: Diagnosis not present

## 2021-10-16 DIAGNOSIS — G9529 Other cord compression: Secondary | ICD-10-CM | POA: Diagnosis not present

## 2021-10-16 DIAGNOSIS — E559 Vitamin D deficiency, unspecified: Secondary | ICD-10-CM | POA: Diagnosis not present

## 2021-10-16 DIAGNOSIS — L989 Disorder of the skin and subcutaneous tissue, unspecified: Secondary | ICD-10-CM | POA: Diagnosis not present

## 2021-10-16 DIAGNOSIS — M300 Polyarteritis nodosa: Secondary | ICD-10-CM | POA: Diagnosis not present

## 2021-10-16 DIAGNOSIS — E785 Hyperlipidemia, unspecified: Secondary | ICD-10-CM | POA: Diagnosis not present

## 2021-10-16 DIAGNOSIS — E538 Deficiency of other specified B group vitamins: Secondary | ICD-10-CM | POA: Diagnosis not present

## 2021-10-16 DIAGNOSIS — G4733 Obstructive sleep apnea (adult) (pediatric): Secondary | ICD-10-CM | POA: Diagnosis not present

## 2021-10-16 DIAGNOSIS — M4802 Spinal stenosis, cervical region: Secondary | ICD-10-CM | POA: Diagnosis not present

## 2021-10-16 DIAGNOSIS — M5136 Other intervertebral disc degeneration, lumbar region: Secondary | ICD-10-CM | POA: Diagnosis not present

## 2021-10-16 DIAGNOSIS — I872 Venous insufficiency (chronic) (peripheral): Secondary | ICD-10-CM | POA: Diagnosis not present

## 2021-10-16 DIAGNOSIS — K219 Gastro-esophageal reflux disease without esophagitis: Secondary | ICD-10-CM | POA: Diagnosis not present

## 2021-10-16 DIAGNOSIS — K259 Gastric ulcer, unspecified as acute or chronic, without hemorrhage or perforation: Secondary | ICD-10-CM | POA: Diagnosis not present

## 2021-10-16 DIAGNOSIS — G9589 Other specified diseases of spinal cord: Secondary | ICD-10-CM | POA: Diagnosis not present

## 2021-10-16 DIAGNOSIS — G629 Polyneuropathy, unspecified: Secondary | ICD-10-CM | POA: Diagnosis not present

## 2021-10-16 DIAGNOSIS — G43909 Migraine, unspecified, not intractable, without status migrainosus: Secondary | ICD-10-CM | POA: Diagnosis not present

## 2021-10-16 DIAGNOSIS — Z993 Dependence on wheelchair: Secondary | ICD-10-CM | POA: Diagnosis not present

## 2021-10-16 DIAGNOSIS — F32A Depression, unspecified: Secondary | ICD-10-CM | POA: Diagnosis not present

## 2021-10-16 DIAGNOSIS — Z9181 History of falling: Secondary | ICD-10-CM | POA: Diagnosis not present

## 2021-10-16 DIAGNOSIS — M797 Fibromyalgia: Secondary | ICD-10-CM | POA: Diagnosis not present

## 2021-10-18 DIAGNOSIS — E538 Deficiency of other specified B group vitamins: Secondary | ICD-10-CM | POA: Diagnosis not present

## 2021-10-18 DIAGNOSIS — E559 Vitamin D deficiency, unspecified: Secondary | ICD-10-CM

## 2021-10-18 DIAGNOSIS — E785 Hyperlipidemia, unspecified: Secondary | ICD-10-CM

## 2021-10-18 DIAGNOSIS — K259 Gastric ulcer, unspecified as acute or chronic, without hemorrhage or perforation: Secondary | ICD-10-CM

## 2021-10-18 DIAGNOSIS — G629 Polyneuropathy, unspecified: Secondary | ICD-10-CM | POA: Diagnosis not present

## 2021-10-18 DIAGNOSIS — K760 Fatty (change of) liver, not elsewhere classified: Secondary | ICD-10-CM | POA: Diagnosis not present

## 2021-10-18 DIAGNOSIS — G4733 Obstructive sleep apnea (adult) (pediatric): Secondary | ICD-10-CM

## 2021-10-18 DIAGNOSIS — K219 Gastro-esophageal reflux disease without esophagitis: Secondary | ICD-10-CM

## 2021-10-18 DIAGNOSIS — M4802 Spinal stenosis, cervical region: Secondary | ICD-10-CM | POA: Diagnosis not present

## 2021-10-18 DIAGNOSIS — I10 Essential (primary) hypertension: Secondary | ICD-10-CM | POA: Diagnosis not present

## 2021-10-18 DIAGNOSIS — M300 Polyarteritis nodosa: Secondary | ICD-10-CM | POA: Diagnosis not present

## 2021-10-18 DIAGNOSIS — G43909 Migraine, unspecified, not intractable, without status migrainosus: Secondary | ICD-10-CM

## 2021-10-18 DIAGNOSIS — G9589 Other specified diseases of spinal cord: Secondary | ICD-10-CM | POA: Diagnosis not present

## 2021-10-18 DIAGNOSIS — M797 Fibromyalgia: Secondary | ICD-10-CM | POA: Diagnosis not present

## 2021-10-18 DIAGNOSIS — F32A Depression, unspecified: Secondary | ICD-10-CM

## 2021-10-18 DIAGNOSIS — Z87440 Personal history of urinary (tract) infections: Secondary | ICD-10-CM

## 2021-10-18 DIAGNOSIS — G9529 Other cord compression: Secondary | ICD-10-CM | POA: Diagnosis not present

## 2021-10-18 DIAGNOSIS — I872 Venous insufficiency (chronic) (peripheral): Secondary | ICD-10-CM | POA: Diagnosis not present

## 2021-10-18 DIAGNOSIS — Z9181 History of falling: Secondary | ICD-10-CM

## 2021-10-18 DIAGNOSIS — L989 Disorder of the skin and subcutaneous tissue, unspecified: Secondary | ICD-10-CM | POA: Diagnosis not present

## 2021-10-18 DIAGNOSIS — M5136 Other intervertebral disc degeneration, lumbar region: Secondary | ICD-10-CM | POA: Diagnosis not present

## 2021-10-18 DIAGNOSIS — Z993 Dependence on wheelchair: Secondary | ICD-10-CM

## 2021-10-18 DIAGNOSIS — F419 Anxiety disorder, unspecified: Secondary | ICD-10-CM

## 2021-10-19 DIAGNOSIS — M4802 Spinal stenosis, cervical region: Secondary | ICD-10-CM | POA: Diagnosis not present

## 2021-10-19 DIAGNOSIS — K259 Gastric ulcer, unspecified as acute or chronic, without hemorrhage or perforation: Secondary | ICD-10-CM | POA: Diagnosis not present

## 2021-10-19 DIAGNOSIS — L989 Disorder of the skin and subcutaneous tissue, unspecified: Secondary | ICD-10-CM | POA: Diagnosis not present

## 2021-10-19 DIAGNOSIS — M300 Polyarteritis nodosa: Secondary | ICD-10-CM | POA: Diagnosis not present

## 2021-10-19 DIAGNOSIS — E538 Deficiency of other specified B group vitamins: Secondary | ICD-10-CM | POA: Diagnosis not present

## 2021-10-19 DIAGNOSIS — K219 Gastro-esophageal reflux disease without esophagitis: Secondary | ICD-10-CM | POA: Diagnosis not present

## 2021-10-19 DIAGNOSIS — Z87440 Personal history of urinary (tract) infections: Secondary | ICD-10-CM | POA: Diagnosis not present

## 2021-10-19 DIAGNOSIS — I10 Essential (primary) hypertension: Secondary | ICD-10-CM | POA: Diagnosis not present

## 2021-10-19 DIAGNOSIS — K760 Fatty (change of) liver, not elsewhere classified: Secondary | ICD-10-CM | POA: Diagnosis not present

## 2021-10-19 DIAGNOSIS — Z993 Dependence on wheelchair: Secondary | ICD-10-CM | POA: Diagnosis not present

## 2021-10-19 DIAGNOSIS — G9589 Other specified diseases of spinal cord: Secondary | ICD-10-CM | POA: Diagnosis not present

## 2021-10-19 DIAGNOSIS — G43909 Migraine, unspecified, not intractable, without status migrainosus: Secondary | ICD-10-CM | POA: Diagnosis not present

## 2021-10-19 DIAGNOSIS — I872 Venous insufficiency (chronic) (peripheral): Secondary | ICD-10-CM | POA: Diagnosis not present

## 2021-10-19 DIAGNOSIS — M797 Fibromyalgia: Secondary | ICD-10-CM | POA: Diagnosis not present

## 2021-10-19 DIAGNOSIS — E785 Hyperlipidemia, unspecified: Secondary | ICD-10-CM | POA: Diagnosis not present

## 2021-10-19 DIAGNOSIS — M5136 Other intervertebral disc degeneration, lumbar region: Secondary | ICD-10-CM | POA: Diagnosis not present

## 2021-10-19 DIAGNOSIS — Z9181 History of falling: Secondary | ICD-10-CM | POA: Diagnosis not present

## 2021-10-19 DIAGNOSIS — E559 Vitamin D deficiency, unspecified: Secondary | ICD-10-CM | POA: Diagnosis not present

## 2021-10-19 DIAGNOSIS — G629 Polyneuropathy, unspecified: Secondary | ICD-10-CM | POA: Diagnosis not present

## 2021-10-19 DIAGNOSIS — F32A Depression, unspecified: Secondary | ICD-10-CM | POA: Diagnosis not present

## 2021-10-19 DIAGNOSIS — G4733 Obstructive sleep apnea (adult) (pediatric): Secondary | ICD-10-CM | POA: Diagnosis not present

## 2021-10-19 DIAGNOSIS — G9529 Other cord compression: Secondary | ICD-10-CM | POA: Diagnosis not present

## 2021-10-20 ENCOUNTER — Telehealth: Payer: Medicare Other | Admitting: Family Medicine

## 2021-10-20 ENCOUNTER — Ambulatory Visit: Payer: Commercial Managed Care - HMO | Admitting: Neurology

## 2021-10-20 ENCOUNTER — Ambulatory Visit: Payer: Self-pay | Admitting: Neurology

## 2021-10-20 MED FILL — SKYRIZI 150 MG/ML SUBCUTANEOUS PEN INJECTOR: SUBCUTANEOUS | 84 days supply | Qty: 1 | Fill #1

## 2021-10-21 ENCOUNTER — Telehealth (INDEPENDENT_AMBULATORY_CARE_PROVIDER_SITE_OTHER): Payer: Medicare Other | Admitting: Family Medicine

## 2021-10-21 ENCOUNTER — Encounter: Payer: Self-pay | Admitting: Family Medicine

## 2021-10-21 DIAGNOSIS — B86 Scabies: Secondary | ICD-10-CM | POA: Diagnosis not present

## 2021-10-21 MED ORDER — HYDROXYZINE PAMOATE 25 MG PO CAPS: 25.0000 mg | ORAL_CAPSULE | Freq: Three times a day (TID) | ORAL | 0 refills | Status: DC | PRN

## 2021-10-21 MED ORDER — PERMETHRIN 5 % EX CREA: 1.0000 "application " | TOPICAL_CREAM | Freq: Once | CUTANEOUS | 1 refills | Status: AC

## 2021-10-21 NOTE — Progress Notes (Signed)
Virtual Visit via Video   I connected with patient on 10/21/21 at 12:40 PM EDT by a video enabled telemedicine application and verified that I am speaking with the correct person using two identifiers.  Location patient: Home Location provider: [facility], Office Persons participating in the virtual visit: Patient, Provider, Egan Marcille Blanco C)  I discussed the limitations of evaluation and management by telemedicine and the availability of in person appointments. The patient expressed understanding and agreed to proceed.  Subjective:   HPI:   Rash- pt had similar sxs in late March.  Her friend who previously exposed her came by for another visit.  Shortly after, pt again developed itching all over.  Pt has used a magnifying glass and is able to see them on her skin and was able to watch one burrow into her skin.  Pt reports she has itching from knees to neck and then elbows up to shoulders.  ROS:   See pertinent positives and negatives per HPI.  Patient Active Problem List   Diagnosis Date Noted   Positive blood cultures 08/14/2021   PNA (pneumonia) 08/12/2021   Fever of unknown origin 08/12/2021   Sacral pressure sore 07/06/2021   Vitamin B6 deficiency 06/08/2021   Myelomalacia of cervical cord ([place]) 06/06/2021   Compressive cervical cord myelomalacia ([place]) 06/05/2021   B12 deficiency 06/05/2021   Folate deficiency 06/05/2021   Vitamin D deficiency 06/05/2021   Leg weakness, bilateral 06/04/2021   Cervical atypia 01/06/2021   Steatosis of liver 01/06/2021   Hyponatremia 07/27/2019   Urinary urgency 04/04/2018   Chronic narcotic use 09/07/2016   Acute blood loss anemia 01/14/2016   Multiple gastric ulcers    PAN (polyarteritis nodosa) ([place]) 11/24/2015   GERD (gastroesophageal reflux disease) 01/08/2014   Routine general medical examination at a health care facility 04/23/2013   Bariatric surgery status 10/31/2012   S/P skin biopsy 10/31/2012   Psoriasis  01/09/2012   DDD (degenerative disc disease), lumbar 11/30/2011   Migraine headache    [name] (obstructive sleep apnea) 11/16/2010   HYPERGLYCEMIA, FASTING 10/08/2009   CONTRACTURE OF TENDON 08/17/2009   PARESTHESIA, HANDS 12/23/2008   Leg pain, right 11/05/2008   ADVERSE DRUG REACTION 04/03/2008   PERIPHERAL EDEMA 03/26/2008   Obesity, Class III, BMI 40-49.9 (morbid obesity) ([place]) 01/03/2007   Cellulitis 01/03/2007   Hyperlipidemia 09/22/2006   Fibromyalgia 09/20/2006   Anxiety and depression 06/28/2006   Essential hypertension 06/28/2006    Social History   Tobacco Use   Smoking status: Never   Smokeless tobacco: Never  Substance Use Topics   Alcohol use: No    Current Outpatient Medications:    ALPRAZolam (XANAX) 0.5 MG tablet, TAKE 1 TABLET BY MOUTH 2 TIMES DAILY, Disp: 30 tablet, Rfl: 3   ARIPiprazole (ABILIFY) 15 MG tablet, Take 1 tablet (15 mg total) by mouth daily., Disp: 90 tablet, Rfl: 1   Armodafinil 250 MG tablet, Take 250 mg by mouth in the morning and at bedtime., Disp: , Rfl:    Biotin 10 MG TABS, Take 10 mg by mouth in the morning. , Disp: , Rfl:    buPROPion (WELLBUTRIN XL) 300 MG 24 hr tablet, TAKE 1 TABLET BY MOUTH EVERY DAY (Patient taking differently: Take 300 mg by mouth daily.), Disp: 30 tablet, Rfl: 2   calcium citrate (CALCITRATE - DOSED IN MG ELEMENTAL CALCIUM) 950 MG tablet, Take 1 tablet by mouth daily., Disp: , Rfl:    clonazePAM (KLONOPIN) 1 MG tablet, TAKE 1 TABLET BY  MOUTH AT BEDTIME, Disp: 30 tablet, Rfl: 3   cyclobenzaprine (FLEXERIL) 10 MG tablet, Take 2 tablets (20 mg total) by mouth 2 (two) times daily., Disp: 120 tablet, Rfl: 2   diclofenac Sodium (VOLTAREN) 1 % GEL, Apply 4 g topically 4 (four) times daily., Disp: , Rfl:    diphenhydrAMINE (BENADRYL) 25 MG tablet, Take 25 mg by mouth 2 (two) times daily as needed for allergies., Disp: , Rfl:    DULoxetine (CYMBALTA) 60 MG capsule, Take 2 capsules (120 mg total) by mouth daily. (Patient taking  differently: Take 60 mg by mouth 2 (two) times daily.), Disp: 240 capsule, Rfl: 2   FEROSUL 325 (65 Fe) MG tablet, TAKE 1 TABLET BY MOUTH EVERY DAY WITH BREAKFAST (Patient taking differently: Take 325 mg by mouth daily with breakfast.), Disp: 30 tablet, Rfl: 2   folic acid (FOLVITE) 1 MG tablet, TAKE 1 TABLET BY MOUTH EVERY DAY, Disp: 30 tablet, Rfl: 2   gabapentin (NEURONTIN) 600 MG tablet, Take 1 tablet (600 mg total) by mouth 3 (three) times daily. TAKE 2 TABLETS BY MOUTH 3 TIMES DAILY (Patient taking differently: Take 600 mg by mouth 3 (three) times daily.), Disp: 270 tablet, Rfl: 0   GNP VITAMIN B-1 100 MG tablet, TAKE 1 TABLET BY MOUTH EVERY DAY, Disp: 30 tablet, Rfl: 2   HYDROcodone-acetaminophen (NORCO) 10-325 MG tablet, TAKE 1 TABLET BY MOUTH EVERY 6 HOURS AS NEEDED, Disp: 90 tablet, Rfl: 0   nebivolol (BYSTOLIC) 10 MG tablet, TAKE 1 TABLET BY MOUTH EVERY DAY (Patient taking differently: Take 10 mg by mouth daily.), Disp: 30 tablet, Rfl: 2   nystatin (MYCOSTATIN/NYSTOP) powder, APPLY 1 APPLICATION TOPICALLY 3 TIMES DAILY, Disp: 60 g, Rfl: 3   Oxcarbazepine (TRILEPTAL) 300 MG tablet, Take 300 mg by mouth 2 (two) times daily., Disp: , Rfl:    pantoprazole (PROTONIX) 40 MG tablet, TAKE 1 TABLET BY MOUTH 2 TIMES DAILY (Patient taking differently: Take 40 mg by mouth 2 (two) times daily.), Disp: 60 tablet, Rfl: 2   phentermine 37.5 MG capsule, Take 1 capsule (37.5 mg total) by mouth every morning., Disp: 30 capsule, Rfl: 2   promethazine (PHENERGAN) 25 MG tablet, TAKE 1 TABLET BY MOUTH EVERY 6 HOURS AS NEEDED FOR NAUSEA AND vomiting (Patient taking differently: Take 25 mg by mouth every 6 (six) hours as needed for nausea or vomiting.), Disp: 45 tablet, Rfl: 3   Pyridoxine HCl (B-6) 100 MG TABS, TAKE 1 TABLET BY MOUTH EVERY DAY, Disp: 30 tablet, Rfl: 2   rosuvastatin (CRESTOR) 20 MG tablet, TAKE 1 TABLET BY MOUTH EVERY DAY, Disp: 90 tablet, Rfl: 2   senna-docusate (SENOKOT-S) 8.6-50 MG tablet, Take  1 tablet by mouth at bedtime. (Patient taking differently: Take 1 tablet by mouth daily as needed for mild constipation.), Disp: 30 tablet, Rfl: 0   SKYRIZI 150 MG/ML SOSY, Inject 150 mg into the skin every 3 (three) months., Disp: , Rfl:    vitamin B-12 (CYANOCOBALAMIN) 1000 MCG tablet, TAKE 1 TABLET BY MOUTH EVERY DAY, Disp: 30 tablet, Rfl: 2   furosemide (LASIX) 40 MG tablet, TAKE 1 TABLET BY MOUTH EVERY DAY MAY take additional TABLET IF weight INCREASE by 5lb AND call MD FOR further instructions (Patient taking differently: Take 40 mg by mouth daily. 1t qd-may take an additional tablet if weight increases by 5lbs.), Disp: 30 tablet, Rfl: 1  Allergies  Allergen Reactions   Niacin Anaphylaxis, Shortness Of Breath and Swelling    Swelling and "problems breathing"  Sulfamethoxazole-Trimethoprim Anaphylaxis, Swelling, Rash and Other (See Comments)    Throat closed and the eyes became swollen   Aspirin Hives   Bactrim Hives   Benzoin Compound Rash   Cephalexin Hives    Tolerated ceftriaxone and cefepime 07/2019   Clindamycin/Lincomycin Hives   Iohexol Hives    Pt treated with PO benedryl   Lisinopril Hives   Naproxen Hives   Pnu-Imune [Pneumococcal Polysaccharide Vaccine] Rash   Sulfonamide Derivatives Rash    Objective:   There were no vitals taken for this visit. AAOx3, NAD Obese NCAT, EOMI No obvious CN deficits Coloring WNL Pt is able to speak clearly, coherently without shortness of breath or increased work of breathing.  Thought process is linear.  Mood is appropriate.   Assessment and Plan:   Scabies- recurrent.  Pt had similar sxs after a visit by same friend earlier this year.  Reports she has been able to see the mites on her skin w/ assistance of magnifying glass.  Repeat Elimite treatment.  Add Hydroxyzine for itching.  Pt expressed understanding and is in agreement w/ plan.    [name], MD 10/21/2021

## 2021-10-25 ENCOUNTER — Ambulatory Visit: Payer: Commercial Managed Care - HMO | Admitting: Neurology

## 2021-10-25 ENCOUNTER — Encounter: Payer: Self-pay | Admitting: Family Medicine

## 2021-10-26 ENCOUNTER — Telehealth: Payer: Self-pay | Admitting: Family Medicine

## 2021-10-26 DIAGNOSIS — F32A Depression, unspecified: Secondary | ICD-10-CM | POA: Diagnosis not present

## 2021-10-26 DIAGNOSIS — E559 Vitamin D deficiency, unspecified: Secondary | ICD-10-CM | POA: Diagnosis not present

## 2021-10-26 DIAGNOSIS — M797 Fibromyalgia: Secondary | ICD-10-CM | POA: Diagnosis not present

## 2021-10-26 DIAGNOSIS — L989 Disorder of the skin and subcutaneous tissue, unspecified: Secondary | ICD-10-CM | POA: Diagnosis not present

## 2021-10-26 DIAGNOSIS — K219 Gastro-esophageal reflux disease without esophagitis: Secondary | ICD-10-CM | POA: Diagnosis not present

## 2021-10-26 DIAGNOSIS — K760 Fatty (change of) liver, not elsewhere classified: Secondary | ICD-10-CM | POA: Diagnosis not present

## 2021-10-26 DIAGNOSIS — I10 Essential (primary) hypertension: Secondary | ICD-10-CM | POA: Diagnosis not present

## 2021-10-26 DIAGNOSIS — G629 Polyneuropathy, unspecified: Secondary | ICD-10-CM | POA: Diagnosis not present

## 2021-10-26 DIAGNOSIS — G43909 Migraine, unspecified, not intractable, without status migrainosus: Secondary | ICD-10-CM | POA: Diagnosis not present

## 2021-10-26 DIAGNOSIS — M5136 Other intervertebral disc degeneration, lumbar region: Secondary | ICD-10-CM | POA: Diagnosis not present

## 2021-10-26 DIAGNOSIS — G4733 Obstructive sleep apnea (adult) (pediatric): Secondary | ICD-10-CM | POA: Diagnosis not present

## 2021-10-26 DIAGNOSIS — Z9181 History of falling: Secondary | ICD-10-CM | POA: Diagnosis not present

## 2021-10-26 DIAGNOSIS — G9529 Other cord compression: Secondary | ICD-10-CM | POA: Diagnosis not present

## 2021-10-26 DIAGNOSIS — Z993 Dependence on wheelchair: Secondary | ICD-10-CM | POA: Diagnosis not present

## 2021-10-26 DIAGNOSIS — E785 Hyperlipidemia, unspecified: Secondary | ICD-10-CM | POA: Diagnosis not present

## 2021-10-26 DIAGNOSIS — E538 Deficiency of other specified B group vitamins: Secondary | ICD-10-CM | POA: Diagnosis not present

## 2021-10-26 DIAGNOSIS — M4802 Spinal stenosis, cervical region: Secondary | ICD-10-CM | POA: Diagnosis not present

## 2021-10-26 DIAGNOSIS — M300 Polyarteritis nodosa: Secondary | ICD-10-CM | POA: Diagnosis not present

## 2021-10-26 DIAGNOSIS — I872 Venous insufficiency (chronic) (peripheral): Secondary | ICD-10-CM | POA: Diagnosis not present

## 2021-10-26 DIAGNOSIS — G9589 Other specified diseases of spinal cord: Secondary | ICD-10-CM | POA: Diagnosis not present

## 2021-10-26 DIAGNOSIS — K259 Gastric ulcer, unspecified as acute or chronic, without hemorrhage or perforation: Secondary | ICD-10-CM | POA: Diagnosis not present

## 2021-10-26 DIAGNOSIS — Z87440 Personal history of urinary (tract) infections: Secondary | ICD-10-CM | POA: Diagnosis not present

## 2021-10-26 NOTE — Telephone Encounter (Signed)
Left message for patient to call back and schedule Medicare Annual Wellness Visit (AWV).   Please offer to do virtually or by telephone.  Left office number and my jabber #[phone].  Last AWV:09/14/2020  Please schedule at anytime with Nurse Health Advisor.   

## 2021-10-28 ENCOUNTER — Other Ambulatory Visit: Payer: Self-pay | Admitting: Family Medicine

## 2021-10-29 ENCOUNTER — Telehealth (INDEPENDENT_AMBULATORY_CARE_PROVIDER_SITE_OTHER): Payer: Medicare Other | Admitting: Family Medicine

## 2021-10-29 ENCOUNTER — Encounter: Payer: Self-pay | Admitting: Family Medicine

## 2021-10-29 DIAGNOSIS — F419 Anxiety disorder, unspecified: Secondary | ICD-10-CM | POA: Diagnosis not present

## 2021-10-29 DIAGNOSIS — F32A Depression, unspecified: Secondary | ICD-10-CM | POA: Diagnosis not present

## 2021-10-29 DIAGNOSIS — B86 Scabies: Secondary | ICD-10-CM | POA: Diagnosis not present

## 2021-10-29 NOTE — Progress Notes (Incomplete)
Virtual Visit via Video   I connected with patient on 10/29/21 at 10:40 AM EDT by a video enabled telemedicine application and verified that I am speaking with the correct person using two identifiers.  Location patient: Home Location provider: [facility], Office Persons participating in the virtual visit: Patient, Provider, [place] Marcille Blanco C)  I discussed the limitations of evaluation and management by telemedicine and the availability of in person appointments. The patient expressed understanding and agreed to proceed.  Subjective:   HPI:   Caregiver stress- pt is mostly bed bound but is trying to care for her 20 yr old father.  'i'm just worrying to death'.  Son is trying to manage both of them.  Pt is not able to care for herself if others are not around.  Does not have a walker and not able to ambulate independently.    Scabies- resolved  ROS:   See pertinent positives and negatives per HPI.  Patient Active Problem List   Diagnosis Date Noted   Positive blood cultures 08/14/2021   PNA (pneumonia) 08/12/2021   Fever of unknown origin 08/12/2021   Sacral pressure sore 07/06/2021   Vitamin B6 deficiency 06/08/2021   Myelomalacia of cervical cord ([place]) 06/06/2021   Compressive cervical cord myelomalacia ([place]) 06/05/2021   B12 deficiency 06/05/2021   Folate deficiency 06/05/2021   Vitamin D deficiency 06/05/2021   Leg weakness, bilateral 06/04/2021   Cervical atypia 01/06/2021   Steatosis of liver 01/06/2021   Hyponatremia 07/27/2019   Urinary urgency 04/04/2018   Chronic narcotic use 09/07/2016   Acute blood loss anemia 01/14/2016   Multiple gastric ulcers    PAN (polyarteritis nodosa) ([place]) 11/24/2015   GERD (gastroesophageal reflux disease) 01/08/2014   Routine general medical examination at a health care facility 04/23/2013   Bariatric surgery status 10/31/2012   S/P skin biopsy 10/31/2012   Psoriasis 01/09/2012   DDD (degenerative disc disease), lumbar  11/30/2011   Migraine headache    [name] (obstructive sleep apnea) 11/16/2010   HYPERGLYCEMIA, FASTING 10/08/2009   CONTRACTURE OF TENDON 08/17/2009   PARESTHESIA, HANDS 12/23/2008   Leg pain, right 11/05/2008   ADVERSE DRUG REACTION 04/03/2008   PERIPHERAL EDEMA 03/26/2008   Obesity, Class III, BMI 40-49.9 (morbid obesity) ([place]) 01/03/2007   Cellulitis 01/03/2007   Hyperlipidemia 09/22/2006   Fibromyalgia 09/20/2006   Anxiety and depression 06/28/2006   Essential hypertension 06/28/2006    Social History   Tobacco Use   Smoking status: Never   Smokeless tobacco: Never  Substance Use Topics   Alcohol use: No    Current Outpatient Medications:    ALPRAZolam (XANAX) 0.5 MG tablet, TAKE 1 TABLET BY MOUTH 2 TIMES DAILY, Disp: 30 tablet, Rfl: 3   ARIPiprazole (ABILIFY) 15 MG tablet, Take 1 tablet (15 mg total) by mouth daily., Disp: 90 tablet, Rfl: 1   Armodafinil 250 MG tablet, Take 250 mg by mouth in the morning and at bedtime., Disp: , Rfl:    Biotin 10 MG TABS, Take 10 mg by mouth in the morning. , Disp: , Rfl:    buPROPion (WELLBUTRIN XL) 300 MG 24 hr tablet, TAKE 1 TABLET BY MOUTH EVERY DAY, Disp: 30 tablet, Rfl: 2   calcium citrate (CALCITRATE - DOSED IN MG ELEMENTAL CALCIUM) 950 MG tablet, Take 1 tablet by mouth daily., Disp: , Rfl:    clonazePAM (KLONOPIN) 1 MG tablet, TAKE 1 TABLET BY MOUTH AT BEDTIME, Disp: 30 tablet, Rfl: 3   cyclobenzaprine (FLEXERIL) 10 MG tablet, Take  2 tablets (20 mg total) by mouth 2 (two) times daily., Disp: 120 tablet, Rfl: 2   diclofenac Sodium (VOLTAREN) 1 % GEL, Apply 4 g topically 4 (four) times daily., Disp: , Rfl:    diphenhydrAMINE (BENADRYL) 25 MG tablet, Take 25 mg by mouth 2 (two) times daily as needed for allergies., Disp: , Rfl:    DULoxetine (CYMBALTA) 60 MG capsule, Take 2 capsules (120 mg total) by mouth daily. (Patient taking differently: Take 60 mg by mouth 2 (two) times daily.), Disp: 240 capsule, Rfl: 2   FEROSUL 325 (65 Fe) MG  tablet, TAKE 1 TABLET BY MOUTH EVERY DAY WITH BREAKFAST, Disp: 30 tablet, Rfl: 2   folic acid (FOLVITE) 1 MG tablet, TAKE 1 TABLET BY MOUTH EVERY DAY, Disp: 30 tablet, Rfl: 2   gabapentin (NEURONTIN) 600 MG tablet, Take 1 tablet (600 mg total) by mouth 3 (three) times daily. TAKE 2 TABLETS BY MOUTH 3 TIMES DAILY (Patient taking differently: Take 600 mg by mouth 3 (three) times daily.), Disp: 270 tablet, Rfl: 0   HYDROcodone-acetaminophen (NORCO) 10-325 MG tablet, TAKE 1 TABLET BY MOUTH EVERY 6 HOURS AS NEEDED, Disp: 90 tablet, Rfl: 0   hydrOXYzine (VISTARIL) 25 MG capsule, Take 1 capsule (25 mg total) by mouth every 8 (eight) hours as needed., Disp: 60 capsule, Rfl: 0   nebivolol (BYSTOLIC) 10 MG tablet, TAKE 1 TABLET BY MOUTH EVERY DAY, Disp: 30 tablet, Rfl: 2   nystatin (MYCOSTATIN/NYSTOP) powder, APPLY 1 APPLICATION TOPICALLY 3 TIMES DAILY, Disp: 60 g, Rfl: 3   Oxcarbazepine (TRILEPTAL) 300 MG tablet, Take 300 mg by mouth 2 (two) times daily., Disp: , Rfl:    pantoprazole (PROTONIX) 40 MG tablet, TAKE 1 TABLET BY MOUTH 2 TIMES DAILY, Disp: 60 tablet, Rfl: 2   phentermine 37.5 MG capsule, Take 1 capsule (37.5 mg total) by mouth every morning., Disp: 30 capsule, Rfl: 2   promethazine (PHENERGAN) 25 MG tablet, TAKE 1 TABLET BY MOUTH EVERY 6 HOURS AS NEEDED FOR NAUSEA AND vomiting (Patient taking differently: Take 25 mg by mouth every 6 (six) hours as needed for nausea or vomiting.), Disp: 45 tablet, Rfl: 3   pyridOXINE (VITAMIN B-6) 100 MG tablet, TAKE 1 TABLET BY MOUTH EVERY DAY, Disp: 30 tablet, Rfl: 2   rosuvastatin (CRESTOR) 20 MG tablet, TAKE 1 TABLET BY MOUTH EVERY DAY, Disp: 90 tablet, Rfl: 2   senna-docusate (SENOKOT-S) 8.6-50 MG tablet, Take 1 tablet by mouth at bedtime. (Patient taking differently: Take 1 tablet by mouth daily as needed for mild constipation.), Disp: 30 tablet, Rfl: 0   SKYRIZI 150 MG/ML SOSY, Inject 150 mg into the skin every 3 (three) months., Disp: , Rfl:    thiamine 100  MG tablet, TAKE 1 TABLET BY MOUTH EVERY DAY, Disp: 30 tablet, Rfl: 2   vitamin B-12 (CYANOCOBALAMIN) 1000 MCG tablet, TAKE 1 TABLET BY MOUTH EVERY DAY, Disp: 30 tablet, Rfl: 2   furosemide (LASIX) 40 MG tablet, TAKE 1 TABLET BY MOUTH EVERY DAY MAY take additional TABLET IF weight INCREASE by 5lb AND call MD FOR further instructions (Patient taking differently: Take 40 mg by mouth daily. 1t qd-may take an additional tablet if weight increases by 5lbs.), Disp: 30 tablet, Rfl: 1  Allergies  Allergen Reactions   Niacin Anaphylaxis, Shortness Of Breath and Swelling    Swelling and "problems breathing"   Sulfamethoxazole-Trimethoprim Anaphylaxis, Swelling, Rash and Other (See Comments)    Throat closed and the eyes became swollen   Aspirin Hives  Bactrim Hives   Benzoin Compound Rash   Cephalexin Hives    Tolerated ceftriaxone and cefepime 07/2019   Clindamycin/Lincomycin Hives   Iohexol Hives    Pt treated with PO benedryl   Lisinopril Hives   Naproxen Hives   Pnu-Imune [Pneumococcal Polysaccharide Vaccine] Rash   Sulfonamide Derivatives Rash    Objective:   There were no vitals taken for this visit.  Patient is well-developed, well-nourished in no acute distress.  Resting comfortably *** at home.  Head is normocephalic, atraumatic.  No labored breathing.  Speech is clear and coherent with logical content.  Patient is alert and oriented at baseline.  ***  Assessment and Plan:        ***.   [name], MD 10/29/2021  Time spent with the patient: *** minutes, of which >50% was spent in obtaining information about symptoms, reviewing previous labs, evaluations, and treatments, counseling about condition (please see the discussed topics above), and developing a plan to further investigate it; had a number of questions which I addressed.

## 2021-11-01 DIAGNOSIS — M797 Fibromyalgia: Secondary | ICD-10-CM | POA: Diagnosis not present

## 2021-11-01 DIAGNOSIS — K259 Gastric ulcer, unspecified as acute or chronic, without hemorrhage or perforation: Secondary | ICD-10-CM | POA: Diagnosis not present

## 2021-11-01 DIAGNOSIS — Z9181 History of falling: Secondary | ICD-10-CM | POA: Diagnosis not present

## 2021-11-01 DIAGNOSIS — I872 Venous insufficiency (chronic) (peripheral): Secondary | ICD-10-CM | POA: Diagnosis not present

## 2021-11-01 DIAGNOSIS — G4733 Obstructive sleep apnea (adult) (pediatric): Secondary | ICD-10-CM | POA: Diagnosis not present

## 2021-11-01 DIAGNOSIS — M5136 Other intervertebral disc degeneration, lumbar region: Secondary | ICD-10-CM | POA: Diagnosis not present

## 2021-11-01 DIAGNOSIS — F32A Depression, unspecified: Secondary | ICD-10-CM | POA: Diagnosis not present

## 2021-11-01 DIAGNOSIS — E538 Deficiency of other specified B group vitamins: Secondary | ICD-10-CM | POA: Diagnosis not present

## 2021-11-01 DIAGNOSIS — K760 Fatty (change of) liver, not elsewhere classified: Secondary | ICD-10-CM | POA: Diagnosis not present

## 2021-11-01 DIAGNOSIS — M4802 Spinal stenosis, cervical region: Secondary | ICD-10-CM | POA: Diagnosis not present

## 2021-11-01 DIAGNOSIS — G629 Polyneuropathy, unspecified: Secondary | ICD-10-CM | POA: Diagnosis not present

## 2021-11-01 DIAGNOSIS — E559 Vitamin D deficiency, unspecified: Secondary | ICD-10-CM | POA: Diagnosis not present

## 2021-11-01 DIAGNOSIS — G9589 Other specified diseases of spinal cord: Secondary | ICD-10-CM | POA: Diagnosis not present

## 2021-11-01 DIAGNOSIS — G43909 Migraine, unspecified, not intractable, without status migrainosus: Secondary | ICD-10-CM | POA: Diagnosis not present

## 2021-11-01 DIAGNOSIS — G9529 Other cord compression: Secondary | ICD-10-CM | POA: Diagnosis not present

## 2021-11-01 DIAGNOSIS — I10 Essential (primary) hypertension: Secondary | ICD-10-CM | POA: Diagnosis not present

## 2021-11-01 DIAGNOSIS — M300 Polyarteritis nodosa: Secondary | ICD-10-CM | POA: Diagnosis not present

## 2021-11-01 DIAGNOSIS — L989 Disorder of the skin and subcutaneous tissue, unspecified: Secondary | ICD-10-CM | POA: Diagnosis not present

## 2021-11-01 DIAGNOSIS — Z993 Dependence on wheelchair: Secondary | ICD-10-CM | POA: Diagnosis not present

## 2021-11-01 DIAGNOSIS — Z87440 Personal history of urinary (tract) infections: Secondary | ICD-10-CM | POA: Diagnosis not present

## 2021-11-01 DIAGNOSIS — K219 Gastro-esophageal reflux disease without esophagitis: Secondary | ICD-10-CM | POA: Diagnosis not present

## 2021-11-01 DIAGNOSIS — E785 Hyperlipidemia, unspecified: Secondary | ICD-10-CM | POA: Diagnosis not present

## 2021-11-02 ENCOUNTER — Encounter: Payer: Self-pay | Admitting: Family Medicine

## 2021-11-02 ENCOUNTER — Telehealth: Payer: Self-pay | Admitting: Family Medicine

## 2021-11-02 DIAGNOSIS — M5136 Other intervertebral disc degeneration, lumbar region: Secondary | ICD-10-CM | POA: Diagnosis not present

## 2021-11-02 DIAGNOSIS — M797 Fibromyalgia: Secondary | ICD-10-CM | POA: Diagnosis not present

## 2021-11-02 DIAGNOSIS — M4802 Spinal stenosis, cervical region: Secondary | ICD-10-CM | POA: Diagnosis not present

## 2021-11-02 DIAGNOSIS — I872 Venous insufficiency (chronic) (peripheral): Secondary | ICD-10-CM | POA: Diagnosis not present

## 2021-11-02 DIAGNOSIS — K219 Gastro-esophageal reflux disease without esophagitis: Secondary | ICD-10-CM | POA: Diagnosis not present

## 2021-11-02 DIAGNOSIS — K760 Fatty (change of) liver, not elsewhere classified: Secondary | ICD-10-CM | POA: Diagnosis not present

## 2021-11-02 DIAGNOSIS — G629 Polyneuropathy, unspecified: Secondary | ICD-10-CM | POA: Diagnosis not present

## 2021-11-02 DIAGNOSIS — G9589 Other specified diseases of spinal cord: Secondary | ICD-10-CM | POA: Diagnosis not present

## 2021-11-02 DIAGNOSIS — K259 Gastric ulcer, unspecified as acute or chronic, without hemorrhage or perforation: Secondary | ICD-10-CM | POA: Diagnosis not present

## 2021-11-02 DIAGNOSIS — F32A Depression, unspecified: Secondary | ICD-10-CM | POA: Diagnosis not present

## 2021-11-02 DIAGNOSIS — G43909 Migraine, unspecified, not intractable, without status migrainosus: Secondary | ICD-10-CM | POA: Diagnosis not present

## 2021-11-02 DIAGNOSIS — E559 Vitamin D deficiency, unspecified: Secondary | ICD-10-CM | POA: Diagnosis not present

## 2021-11-02 DIAGNOSIS — M300 Polyarteritis nodosa: Secondary | ICD-10-CM | POA: Diagnosis not present

## 2021-11-02 DIAGNOSIS — L989 Disorder of the skin and subcutaneous tissue, unspecified: Secondary | ICD-10-CM | POA: Diagnosis not present

## 2021-11-02 DIAGNOSIS — Z9181 History of falling: Secondary | ICD-10-CM | POA: Diagnosis not present

## 2021-11-02 DIAGNOSIS — E785 Hyperlipidemia, unspecified: Secondary | ICD-10-CM | POA: Diagnosis not present

## 2021-11-02 DIAGNOSIS — Z993 Dependence on wheelchair: Secondary | ICD-10-CM | POA: Diagnosis not present

## 2021-11-02 DIAGNOSIS — E538 Deficiency of other specified B group vitamins: Secondary | ICD-10-CM | POA: Diagnosis not present

## 2021-11-02 DIAGNOSIS — G4733 Obstructive sleep apnea (adult) (pediatric): Secondary | ICD-10-CM | POA: Diagnosis not present

## 2021-11-02 DIAGNOSIS — I10 Essential (primary) hypertension: Secondary | ICD-10-CM | POA: Diagnosis not present

## 2021-11-02 DIAGNOSIS — Z87440 Personal history of urinary (tract) infections: Secondary | ICD-10-CM | POA: Diagnosis not present

## 2021-11-02 DIAGNOSIS — G9529 Other cord compression: Secondary | ICD-10-CM | POA: Diagnosis not present

## 2021-11-02 NOTE — Telephone Encounter (Signed)
Agree w/ order given.  If sxs don't improve, she will need OV or ER evaluation

## 2021-11-02 NOTE — Telephone Encounter (Signed)
Right lower leg is not looking good, I gave the verbal ok for the nurse to go out and treat that. Just wanted to give you an [name] PT from [facility]

## 2021-11-03 ENCOUNTER — Other Ambulatory Visit: Payer: Self-pay | Admitting: Family Medicine

## 2021-11-07 DIAGNOSIS — E871 Hypo-osmolality and hyponatremia: Secondary | ICD-10-CM | POA: Diagnosis not present

## 2021-11-07 DIAGNOSIS — A419 Sepsis, unspecified organism: Secondary | ICD-10-CM | POA: Diagnosis not present

## 2021-11-07 DIAGNOSIS — G4733 Obstructive sleep apnea (adult) (pediatric): Secondary | ICD-10-CM | POA: Diagnosis not present

## 2021-11-08 DIAGNOSIS — G4733 Obstructive sleep apnea (adult) (pediatric): Secondary | ICD-10-CM | POA: Diagnosis not present

## 2021-11-08 DIAGNOSIS — E785 Hyperlipidemia, unspecified: Secondary | ICD-10-CM | POA: Diagnosis not present

## 2021-11-08 DIAGNOSIS — M5136 Other intervertebral disc degeneration, lumbar region: Secondary | ICD-10-CM | POA: Diagnosis not present

## 2021-11-08 DIAGNOSIS — K219 Gastro-esophageal reflux disease without esophagitis: Secondary | ICD-10-CM | POA: Diagnosis not present

## 2021-11-08 DIAGNOSIS — L989 Disorder of the skin and subcutaneous tissue, unspecified: Secondary | ICD-10-CM | POA: Diagnosis not present

## 2021-11-08 DIAGNOSIS — G43909 Migraine, unspecified, not intractable, without status migrainosus: Secondary | ICD-10-CM | POA: Diagnosis not present

## 2021-11-08 DIAGNOSIS — E538 Deficiency of other specified B group vitamins: Secondary | ICD-10-CM | POA: Diagnosis not present

## 2021-11-08 DIAGNOSIS — Z993 Dependence on wheelchair: Secondary | ICD-10-CM | POA: Diagnosis not present

## 2021-11-08 DIAGNOSIS — I872 Venous insufficiency (chronic) (peripheral): Secondary | ICD-10-CM | POA: Diagnosis not present

## 2021-11-08 DIAGNOSIS — G629 Polyneuropathy, unspecified: Secondary | ICD-10-CM | POA: Diagnosis not present

## 2021-11-08 DIAGNOSIS — Z9181 History of falling: Secondary | ICD-10-CM | POA: Diagnosis not present

## 2021-11-08 DIAGNOSIS — M4802 Spinal stenosis, cervical region: Secondary | ICD-10-CM | POA: Diagnosis not present

## 2021-11-08 DIAGNOSIS — Z87440 Personal history of urinary (tract) infections: Secondary | ICD-10-CM | POA: Diagnosis not present

## 2021-11-08 DIAGNOSIS — K259 Gastric ulcer, unspecified as acute or chronic, without hemorrhage or perforation: Secondary | ICD-10-CM | POA: Diagnosis not present

## 2021-11-08 DIAGNOSIS — M797 Fibromyalgia: Secondary | ICD-10-CM | POA: Diagnosis not present

## 2021-11-08 DIAGNOSIS — I10 Essential (primary) hypertension: Secondary | ICD-10-CM | POA: Diagnosis not present

## 2021-11-08 DIAGNOSIS — F32A Depression, unspecified: Secondary | ICD-10-CM | POA: Diagnosis not present

## 2021-11-08 DIAGNOSIS — E559 Vitamin D deficiency, unspecified: Secondary | ICD-10-CM | POA: Diagnosis not present

## 2021-11-08 DIAGNOSIS — M300 Polyarteritis nodosa: Secondary | ICD-10-CM | POA: Diagnosis not present

## 2021-11-08 DIAGNOSIS — G9529 Other cord compression: Secondary | ICD-10-CM | POA: Diagnosis not present

## 2021-11-08 DIAGNOSIS — K760 Fatty (change of) liver, not elsewhere classified: Secondary | ICD-10-CM | POA: Diagnosis not present

## 2021-11-08 DIAGNOSIS — G9589 Other specified diseases of spinal cord: Secondary | ICD-10-CM | POA: Diagnosis not present

## 2021-11-09 ENCOUNTER — Other Ambulatory Visit: Payer: Self-pay | Admitting: Family Medicine

## 2021-11-09 ENCOUNTER — Telehealth: Payer: Self-pay

## 2021-11-09 DIAGNOSIS — M797 Fibromyalgia: Secondary | ICD-10-CM | POA: Diagnosis not present

## 2021-11-09 DIAGNOSIS — E785 Hyperlipidemia, unspecified: Secondary | ICD-10-CM | POA: Diagnosis not present

## 2021-11-09 DIAGNOSIS — F32A Depression, unspecified: Secondary | ICD-10-CM | POA: Diagnosis not present

## 2021-11-09 DIAGNOSIS — G629 Polyneuropathy, unspecified: Secondary | ICD-10-CM | POA: Diagnosis not present

## 2021-11-09 DIAGNOSIS — G9589 Other specified diseases of spinal cord: Secondary | ICD-10-CM | POA: Diagnosis not present

## 2021-11-09 DIAGNOSIS — M300 Polyarteritis nodosa: Secondary | ICD-10-CM | POA: Diagnosis not present

## 2021-11-09 DIAGNOSIS — K219 Gastro-esophageal reflux disease without esophagitis: Secondary | ICD-10-CM | POA: Diagnosis not present

## 2021-11-09 DIAGNOSIS — M4802 Spinal stenosis, cervical region: Secondary | ICD-10-CM | POA: Diagnosis not present

## 2021-11-09 DIAGNOSIS — G43909 Migraine, unspecified, not intractable, without status migrainosus: Secondary | ICD-10-CM | POA: Diagnosis not present

## 2021-11-09 DIAGNOSIS — K259 Gastric ulcer, unspecified as acute or chronic, without hemorrhage or perforation: Secondary | ICD-10-CM | POA: Diagnosis not present

## 2021-11-09 DIAGNOSIS — K760 Fatty (change of) liver, not elsewhere classified: Secondary | ICD-10-CM | POA: Diagnosis not present

## 2021-11-09 DIAGNOSIS — G9529 Other cord compression: Secondary | ICD-10-CM | POA: Diagnosis not present

## 2021-11-09 DIAGNOSIS — I872 Venous insufficiency (chronic) (peripheral): Secondary | ICD-10-CM | POA: Diagnosis not present

## 2021-11-09 DIAGNOSIS — M5136 Other intervertebral disc degeneration, lumbar region: Secondary | ICD-10-CM | POA: Diagnosis not present

## 2021-11-09 DIAGNOSIS — I10 Essential (primary) hypertension: Secondary | ICD-10-CM | POA: Diagnosis not present

## 2021-11-09 DIAGNOSIS — Z9181 History of falling: Secondary | ICD-10-CM | POA: Diagnosis not present

## 2021-11-09 DIAGNOSIS — L989 Disorder of the skin and subcutaneous tissue, unspecified: Secondary | ICD-10-CM | POA: Diagnosis not present

## 2021-11-09 DIAGNOSIS — Z87440 Personal history of urinary (tract) infections: Secondary | ICD-10-CM | POA: Diagnosis not present

## 2021-11-09 DIAGNOSIS — E538 Deficiency of other specified B group vitamins: Secondary | ICD-10-CM | POA: Diagnosis not present

## 2021-11-09 DIAGNOSIS — Z993 Dependence on wheelchair: Secondary | ICD-10-CM | POA: Diagnosis not present

## 2021-11-09 DIAGNOSIS — G4733 Obstructive sleep apnea (adult) (pediatric): Secondary | ICD-10-CM | POA: Diagnosis not present

## 2021-11-09 DIAGNOSIS — E559 Vitamin D deficiency, unspecified: Secondary | ICD-10-CM | POA: Diagnosis not present

## 2021-11-10 ENCOUNTER — Other Ambulatory Visit: Payer: Self-pay | Admitting: Family Medicine

## 2021-11-10 NOTE — Telephone Encounter (Signed)
In order for [place] to have the appropriate documentation, pt will need an office visit for complete evaluation.  They are very specific in the things we need to test/document

## 2021-11-10 NOTE — Telephone Encounter (Signed)
Pt has scheduled an apt w/ Dr Birdie Riddle for Friday 11/12/21 @ 120 pm.

## 2021-11-10 NOTE — Telephone Encounter (Signed)
Spoke w/ [name] the nurse for [employer] . We advised her that Ms Cajas needs to be seen in office for wounds or go to ER per DR Birdie Riddle .

## 2021-11-11 ENCOUNTER — Ambulatory Visit: Payer: Commercial Managed Care - HMO | Admitting: Neurology

## 2021-11-11 ENCOUNTER — Encounter: Payer: Self-pay | Admitting: Neurology

## 2021-11-11 DIAGNOSIS — M5136 Other intervertebral disc degeneration, lumbar region: Secondary | ICD-10-CM | POA: Diagnosis not present

## 2021-11-11 DIAGNOSIS — G9529 Other cord compression: Secondary | ICD-10-CM | POA: Diagnosis not present

## 2021-11-11 DIAGNOSIS — K259 Gastric ulcer, unspecified as acute or chronic, without hemorrhage or perforation: Secondary | ICD-10-CM | POA: Diagnosis not present

## 2021-11-11 DIAGNOSIS — G4733 Obstructive sleep apnea (adult) (pediatric): Secondary | ICD-10-CM | POA: Diagnosis not present

## 2021-11-11 DIAGNOSIS — K219 Gastro-esophageal reflux disease without esophagitis: Secondary | ICD-10-CM | POA: Diagnosis not present

## 2021-11-11 DIAGNOSIS — E538 Deficiency of other specified B group vitamins: Secondary | ICD-10-CM | POA: Diagnosis not present

## 2021-11-11 DIAGNOSIS — E559 Vitamin D deficiency, unspecified: Secondary | ICD-10-CM | POA: Diagnosis not present

## 2021-11-11 DIAGNOSIS — E785 Hyperlipidemia, unspecified: Secondary | ICD-10-CM | POA: Diagnosis not present

## 2021-11-11 DIAGNOSIS — G629 Polyneuropathy, unspecified: Secondary | ICD-10-CM | POA: Diagnosis not present

## 2021-11-11 DIAGNOSIS — Z87440 Personal history of urinary (tract) infections: Secondary | ICD-10-CM | POA: Diagnosis not present

## 2021-11-11 DIAGNOSIS — I872 Venous insufficiency (chronic) (peripheral): Secondary | ICD-10-CM | POA: Diagnosis not present

## 2021-11-11 DIAGNOSIS — F32A Depression, unspecified: Secondary | ICD-10-CM | POA: Diagnosis not present

## 2021-11-11 DIAGNOSIS — M300 Polyarteritis nodosa: Secondary | ICD-10-CM | POA: Diagnosis not present

## 2021-11-11 DIAGNOSIS — G43909 Migraine, unspecified, not intractable, without status migrainosus: Secondary | ICD-10-CM | POA: Diagnosis not present

## 2021-11-11 DIAGNOSIS — K760 Fatty (change of) liver, not elsewhere classified: Secondary | ICD-10-CM | POA: Diagnosis not present

## 2021-11-11 DIAGNOSIS — I10 Essential (primary) hypertension: Secondary | ICD-10-CM | POA: Diagnosis not present

## 2021-11-11 DIAGNOSIS — L989 Disorder of the skin and subcutaneous tissue, unspecified: Secondary | ICD-10-CM | POA: Diagnosis not present

## 2021-11-11 DIAGNOSIS — M797 Fibromyalgia: Secondary | ICD-10-CM | POA: Diagnosis not present

## 2021-11-11 DIAGNOSIS — M4802 Spinal stenosis, cervical region: Secondary | ICD-10-CM | POA: Diagnosis not present

## 2021-11-11 DIAGNOSIS — Z993 Dependence on wheelchair: Secondary | ICD-10-CM | POA: Diagnosis not present

## 2021-11-11 DIAGNOSIS — Z9181 History of falling: Secondary | ICD-10-CM | POA: Diagnosis not present

## 2021-11-11 DIAGNOSIS — G9589 Other specified diseases of spinal cord: Secondary | ICD-10-CM | POA: Diagnosis not present

## 2021-11-12 ENCOUNTER — Ambulatory Visit: Payer: Medicare Other | Admitting: Family Medicine

## 2021-11-12 DIAGNOSIS — Z87440 Personal history of urinary (tract) infections: Secondary | ICD-10-CM | POA: Diagnosis not present

## 2021-11-12 DIAGNOSIS — E559 Vitamin D deficiency, unspecified: Secondary | ICD-10-CM | POA: Diagnosis not present

## 2021-11-12 DIAGNOSIS — M797 Fibromyalgia: Secondary | ICD-10-CM | POA: Diagnosis not present

## 2021-11-12 DIAGNOSIS — G629 Polyneuropathy, unspecified: Secondary | ICD-10-CM | POA: Diagnosis not present

## 2021-11-12 DIAGNOSIS — K219 Gastro-esophageal reflux disease without esophagitis: Secondary | ICD-10-CM | POA: Diagnosis not present

## 2021-11-12 DIAGNOSIS — M300 Polyarteritis nodosa: Secondary | ICD-10-CM | POA: Diagnosis not present

## 2021-11-12 DIAGNOSIS — G43909 Migraine, unspecified, not intractable, without status migrainosus: Secondary | ICD-10-CM | POA: Diagnosis not present

## 2021-11-12 DIAGNOSIS — M5136 Other intervertebral disc degeneration, lumbar region: Secondary | ICD-10-CM | POA: Diagnosis not present

## 2021-11-12 DIAGNOSIS — I10 Essential (primary) hypertension: Secondary | ICD-10-CM | POA: Diagnosis not present

## 2021-11-12 DIAGNOSIS — I872 Venous insufficiency (chronic) (peripheral): Secondary | ICD-10-CM | POA: Diagnosis not present

## 2021-11-12 DIAGNOSIS — Z993 Dependence on wheelchair: Secondary | ICD-10-CM | POA: Diagnosis not present

## 2021-11-12 DIAGNOSIS — G9589 Other specified diseases of spinal cord: Secondary | ICD-10-CM | POA: Diagnosis not present

## 2021-11-12 DIAGNOSIS — G9529 Other cord compression: Secondary | ICD-10-CM | POA: Diagnosis not present

## 2021-11-12 DIAGNOSIS — E538 Deficiency of other specified B group vitamins: Secondary | ICD-10-CM | POA: Diagnosis not present

## 2021-11-12 DIAGNOSIS — M4802 Spinal stenosis, cervical region: Secondary | ICD-10-CM | POA: Diagnosis not present

## 2021-11-12 DIAGNOSIS — F32A Depression, unspecified: Secondary | ICD-10-CM | POA: Diagnosis not present

## 2021-11-12 DIAGNOSIS — E785 Hyperlipidemia, unspecified: Secondary | ICD-10-CM | POA: Diagnosis not present

## 2021-11-12 DIAGNOSIS — G4733 Obstructive sleep apnea (adult) (pediatric): Secondary | ICD-10-CM | POA: Diagnosis not present

## 2021-11-12 DIAGNOSIS — K760 Fatty (change of) liver, not elsewhere classified: Secondary | ICD-10-CM | POA: Diagnosis not present

## 2021-11-12 DIAGNOSIS — K259 Gastric ulcer, unspecified as acute or chronic, without hemorrhage or perforation: Secondary | ICD-10-CM | POA: Diagnosis not present

## 2021-11-12 DIAGNOSIS — Z9181 History of falling: Secondary | ICD-10-CM | POA: Diagnosis not present

## 2021-11-12 DIAGNOSIS — L989 Disorder of the skin and subcutaneous tissue, unspecified: Secondary | ICD-10-CM | POA: Diagnosis not present

## 2021-11-15 DIAGNOSIS — M797 Fibromyalgia: Secondary | ICD-10-CM | POA: Diagnosis not present

## 2021-11-15 DIAGNOSIS — K760 Fatty (change of) liver, not elsewhere classified: Secondary | ICD-10-CM | POA: Diagnosis not present

## 2021-11-15 DIAGNOSIS — G9589 Other specified diseases of spinal cord: Secondary | ICD-10-CM | POA: Diagnosis not present

## 2021-11-15 DIAGNOSIS — G43909 Migraine, unspecified, not intractable, without status migrainosus: Secondary | ICD-10-CM | POA: Diagnosis not present

## 2021-11-15 DIAGNOSIS — F32A Depression, unspecified: Secondary | ICD-10-CM | POA: Diagnosis not present

## 2021-11-15 DIAGNOSIS — K219 Gastro-esophageal reflux disease without esophagitis: Secondary | ICD-10-CM | POA: Diagnosis not present

## 2021-11-15 DIAGNOSIS — Z993 Dependence on wheelchair: Secondary | ICD-10-CM | POA: Diagnosis not present

## 2021-11-15 DIAGNOSIS — Z9181 History of falling: Secondary | ICD-10-CM | POA: Diagnosis not present

## 2021-11-15 DIAGNOSIS — E559 Vitamin D deficiency, unspecified: Secondary | ICD-10-CM | POA: Diagnosis not present

## 2021-11-15 DIAGNOSIS — G629 Polyneuropathy, unspecified: Secondary | ICD-10-CM | POA: Diagnosis not present

## 2021-11-15 DIAGNOSIS — I872 Venous insufficiency (chronic) (peripheral): Secondary | ICD-10-CM | POA: Diagnosis not present

## 2021-11-15 DIAGNOSIS — E538 Deficiency of other specified B group vitamins: Secondary | ICD-10-CM | POA: Diagnosis not present

## 2021-11-15 DIAGNOSIS — M5136 Other intervertebral disc degeneration, lumbar region: Secondary | ICD-10-CM | POA: Diagnosis not present

## 2021-11-15 DIAGNOSIS — Z87440 Personal history of urinary (tract) infections: Secondary | ICD-10-CM | POA: Diagnosis not present

## 2021-11-15 DIAGNOSIS — M300 Polyarteritis nodosa: Secondary | ICD-10-CM | POA: Diagnosis not present

## 2021-11-15 DIAGNOSIS — G4733 Obstructive sleep apnea (adult) (pediatric): Secondary | ICD-10-CM | POA: Diagnosis not present

## 2021-11-15 DIAGNOSIS — M4802 Spinal stenosis, cervical region: Secondary | ICD-10-CM | POA: Diagnosis not present

## 2021-11-15 DIAGNOSIS — L989 Disorder of the skin and subcutaneous tissue, unspecified: Secondary | ICD-10-CM | POA: Diagnosis not present

## 2021-11-15 DIAGNOSIS — E785 Hyperlipidemia, unspecified: Secondary | ICD-10-CM | POA: Diagnosis not present

## 2021-11-15 DIAGNOSIS — G9529 Other cord compression: Secondary | ICD-10-CM | POA: Diagnosis not present

## 2021-11-15 DIAGNOSIS — K259 Gastric ulcer, unspecified as acute or chronic, without hemorrhage or perforation: Secondary | ICD-10-CM | POA: Diagnosis not present

## 2021-11-15 DIAGNOSIS — I10 Essential (primary) hypertension: Secondary | ICD-10-CM | POA: Diagnosis not present

## 2021-11-17 DIAGNOSIS — M300 Polyarteritis nodosa: Principal | ICD-10-CM

## 2021-11-17 DIAGNOSIS — L989 Disorder of the skin and subcutaneous tissue, unspecified: Secondary | ICD-10-CM | POA: Diagnosis not present

## 2021-11-17 DIAGNOSIS — K259 Gastric ulcer, unspecified as acute or chronic, without hemorrhage or perforation: Secondary | ICD-10-CM | POA: Diagnosis not present

## 2021-11-17 DIAGNOSIS — Z87440 Personal history of urinary (tract) infections: Secondary | ICD-10-CM | POA: Diagnosis not present

## 2021-11-17 DIAGNOSIS — M5136 Other intervertebral disc degeneration, lumbar region: Secondary | ICD-10-CM | POA: Diagnosis not present

## 2021-11-17 DIAGNOSIS — F32A Depression, unspecified: Secondary | ICD-10-CM | POA: Diagnosis not present

## 2021-11-17 DIAGNOSIS — Z993 Dependence on wheelchair: Secondary | ICD-10-CM | POA: Diagnosis not present

## 2021-11-17 DIAGNOSIS — G43909 Migraine, unspecified, not intractable, without status migrainosus: Secondary | ICD-10-CM | POA: Diagnosis not present

## 2021-11-17 DIAGNOSIS — I872 Venous insufficiency (chronic) (peripheral): Secondary | ICD-10-CM | POA: Diagnosis not present

## 2021-11-17 DIAGNOSIS — K219 Gastro-esophageal reflux disease without esophagitis: Secondary | ICD-10-CM | POA: Diagnosis not present

## 2021-11-17 DIAGNOSIS — E785 Hyperlipidemia, unspecified: Secondary | ICD-10-CM | POA: Diagnosis not present

## 2021-11-17 DIAGNOSIS — K760 Fatty (change of) liver, not elsewhere classified: Secondary | ICD-10-CM | POA: Diagnosis not present

## 2021-11-17 DIAGNOSIS — G9589 Other specified diseases of spinal cord: Secondary | ICD-10-CM | POA: Diagnosis not present

## 2021-11-17 DIAGNOSIS — G4733 Obstructive sleep apnea (adult) (pediatric): Secondary | ICD-10-CM | POA: Diagnosis not present

## 2021-11-17 DIAGNOSIS — I10 Essential (primary) hypertension: Secondary | ICD-10-CM | POA: Diagnosis not present

## 2021-11-17 DIAGNOSIS — G9529 Other cord compression: Secondary | ICD-10-CM | POA: Diagnosis not present

## 2021-11-17 DIAGNOSIS — M4802 Spinal stenosis, cervical region: Secondary | ICD-10-CM | POA: Diagnosis not present

## 2021-11-17 DIAGNOSIS — M797 Fibromyalgia: Secondary | ICD-10-CM | POA: Diagnosis not present

## 2021-11-17 DIAGNOSIS — E559 Vitamin D deficiency, unspecified: Secondary | ICD-10-CM | POA: Diagnosis not present

## 2021-11-17 DIAGNOSIS — Z9181 History of falling: Secondary | ICD-10-CM | POA: Diagnosis not present

## 2021-11-17 DIAGNOSIS — E538 Deficiency of other specified B group vitamins: Secondary | ICD-10-CM | POA: Diagnosis not present

## 2021-11-17 DIAGNOSIS — G629 Polyneuropathy, unspecified: Secondary | ICD-10-CM | POA: Diagnosis not present

## 2021-11-17 MED ORDER — GABAPENTIN 600 MG TABLET
ORAL_TABLET | Freq: Three times a day (TID) | ORAL | 1 refills | 90 days | Status: CP
Start: 2021-11-17 — End: ?

## 2021-11-17 NOTE — Unmapped (Signed)
Dr. Scarlette Calico,    Call received from the patients pharmacy.  Patient notified the pharmacy that she is taking 600 mg of gabapentin three times a day and not 1200 mg three times a day because it makes her too drowsy.  Pharmacy will need an updated script for the patients pill pack.    Script pended with updated frequency and dose.    Tiff

## 2021-11-18 ENCOUNTER — Ambulatory Visit: Payer: Medicare Other | Admitting: Family Medicine

## 2021-11-18 DIAGNOSIS — E538 Deficiency of other specified B group vitamins: Secondary | ICD-10-CM | POA: Diagnosis not present

## 2021-11-18 DIAGNOSIS — M797 Fibromyalgia: Secondary | ICD-10-CM | POA: Diagnosis not present

## 2021-11-18 DIAGNOSIS — G9589 Other specified diseases of spinal cord: Secondary | ICD-10-CM | POA: Diagnosis not present

## 2021-11-18 DIAGNOSIS — K219 Gastro-esophageal reflux disease without esophagitis: Secondary | ICD-10-CM | POA: Diagnosis not present

## 2021-11-18 DIAGNOSIS — M5136 Other intervertebral disc degeneration, lumbar region: Secondary | ICD-10-CM | POA: Diagnosis not present

## 2021-11-18 DIAGNOSIS — G629 Polyneuropathy, unspecified: Secondary | ICD-10-CM | POA: Diagnosis not present

## 2021-11-18 DIAGNOSIS — K259 Gastric ulcer, unspecified as acute or chronic, without hemorrhage or perforation: Secondary | ICD-10-CM | POA: Diagnosis not present

## 2021-11-18 DIAGNOSIS — K760 Fatty (change of) liver, not elsewhere classified: Secondary | ICD-10-CM | POA: Diagnosis not present

## 2021-11-18 DIAGNOSIS — Z993 Dependence on wheelchair: Secondary | ICD-10-CM | POA: Diagnosis not present

## 2021-11-18 DIAGNOSIS — E785 Hyperlipidemia, unspecified: Secondary | ICD-10-CM | POA: Diagnosis not present

## 2021-11-18 DIAGNOSIS — G9529 Other cord compression: Secondary | ICD-10-CM | POA: Diagnosis not present

## 2021-11-18 DIAGNOSIS — M300 Polyarteritis nodosa: Secondary | ICD-10-CM | POA: Diagnosis not present

## 2021-11-18 DIAGNOSIS — Z87440 Personal history of urinary (tract) infections: Secondary | ICD-10-CM | POA: Diagnosis not present

## 2021-11-18 DIAGNOSIS — G4733 Obstructive sleep apnea (adult) (pediatric): Secondary | ICD-10-CM | POA: Diagnosis not present

## 2021-11-18 DIAGNOSIS — L989 Disorder of the skin and subcutaneous tissue, unspecified: Secondary | ICD-10-CM | POA: Diagnosis not present

## 2021-11-18 DIAGNOSIS — E559 Vitamin D deficiency, unspecified: Secondary | ICD-10-CM | POA: Diagnosis not present

## 2021-11-18 DIAGNOSIS — I10 Essential (primary) hypertension: Secondary | ICD-10-CM | POA: Diagnosis not present

## 2021-11-18 DIAGNOSIS — M4802 Spinal stenosis, cervical region: Secondary | ICD-10-CM | POA: Diagnosis not present

## 2021-11-18 DIAGNOSIS — G43909 Migraine, unspecified, not intractable, without status migrainosus: Secondary | ICD-10-CM | POA: Diagnosis not present

## 2021-11-18 DIAGNOSIS — I872 Venous insufficiency (chronic) (peripheral): Secondary | ICD-10-CM | POA: Diagnosis not present

## 2021-11-18 DIAGNOSIS — Z9181 History of falling: Secondary | ICD-10-CM | POA: Diagnosis not present

## 2021-11-18 DIAGNOSIS — F32A Depression, unspecified: Secondary | ICD-10-CM | POA: Diagnosis not present

## 2021-11-22 DIAGNOSIS — F32A Depression, unspecified: Secondary | ICD-10-CM | POA: Diagnosis not present

## 2021-11-22 DIAGNOSIS — K259 Gastric ulcer, unspecified as acute or chronic, without hemorrhage or perforation: Secondary | ICD-10-CM | POA: Diagnosis not present

## 2021-11-22 DIAGNOSIS — G9529 Other cord compression: Secondary | ICD-10-CM | POA: Diagnosis not present

## 2021-11-22 DIAGNOSIS — G9589 Other specified diseases of spinal cord: Secondary | ICD-10-CM | POA: Diagnosis not present

## 2021-11-22 DIAGNOSIS — M300 Polyarteritis nodosa: Secondary | ICD-10-CM | POA: Diagnosis not present

## 2021-11-22 DIAGNOSIS — M4802 Spinal stenosis, cervical region: Secondary | ICD-10-CM | POA: Diagnosis not present

## 2021-11-22 DIAGNOSIS — G629 Polyneuropathy, unspecified: Secondary | ICD-10-CM | POA: Diagnosis not present

## 2021-11-22 DIAGNOSIS — I872 Venous insufficiency (chronic) (peripheral): Secondary | ICD-10-CM | POA: Diagnosis not present

## 2021-11-22 DIAGNOSIS — K760 Fatty (change of) liver, not elsewhere classified: Secondary | ICD-10-CM | POA: Diagnosis not present

## 2021-11-22 DIAGNOSIS — K219 Gastro-esophageal reflux disease without esophagitis: Secondary | ICD-10-CM | POA: Diagnosis not present

## 2021-11-22 DIAGNOSIS — E559 Vitamin D deficiency, unspecified: Secondary | ICD-10-CM | POA: Diagnosis not present

## 2021-11-22 DIAGNOSIS — Z993 Dependence on wheelchair: Secondary | ICD-10-CM | POA: Diagnosis not present

## 2021-11-22 DIAGNOSIS — Z9181 History of falling: Secondary | ICD-10-CM | POA: Diagnosis not present

## 2021-11-22 DIAGNOSIS — M797 Fibromyalgia: Secondary | ICD-10-CM | POA: Diagnosis not present

## 2021-11-22 DIAGNOSIS — L989 Disorder of the skin and subcutaneous tissue, unspecified: Secondary | ICD-10-CM | POA: Diagnosis not present

## 2021-11-22 DIAGNOSIS — I10 Essential (primary) hypertension: Secondary | ICD-10-CM | POA: Diagnosis not present

## 2021-11-22 DIAGNOSIS — G4733 Obstructive sleep apnea (adult) (pediatric): Secondary | ICD-10-CM | POA: Diagnosis not present

## 2021-11-22 DIAGNOSIS — E538 Deficiency of other specified B group vitamins: Secondary | ICD-10-CM | POA: Diagnosis not present

## 2021-11-22 DIAGNOSIS — Z87440 Personal history of urinary (tract) infections: Secondary | ICD-10-CM | POA: Diagnosis not present

## 2021-11-22 DIAGNOSIS — G43909 Migraine, unspecified, not intractable, without status migrainosus: Secondary | ICD-10-CM | POA: Diagnosis not present

## 2021-11-22 DIAGNOSIS — E785 Hyperlipidemia, unspecified: Secondary | ICD-10-CM | POA: Diagnosis not present

## 2021-11-22 DIAGNOSIS — M5136 Other intervertebral disc degeneration, lumbar region: Secondary | ICD-10-CM | POA: Diagnosis not present

## 2021-11-23 DIAGNOSIS — L989 Disorder of the skin and subcutaneous tissue, unspecified: Secondary | ICD-10-CM | POA: Diagnosis not present

## 2021-11-23 DIAGNOSIS — K219 Gastro-esophageal reflux disease without esophagitis: Secondary | ICD-10-CM | POA: Diagnosis not present

## 2021-11-23 DIAGNOSIS — K259 Gastric ulcer, unspecified as acute or chronic, without hemorrhage or perforation: Secondary | ICD-10-CM | POA: Diagnosis not present

## 2021-11-23 DIAGNOSIS — G9589 Other specified diseases of spinal cord: Secondary | ICD-10-CM | POA: Diagnosis not present

## 2021-11-23 DIAGNOSIS — M300 Polyarteritis nodosa: Secondary | ICD-10-CM | POA: Diagnosis not present

## 2021-11-23 DIAGNOSIS — G629 Polyneuropathy, unspecified: Secondary | ICD-10-CM | POA: Diagnosis not present

## 2021-11-23 DIAGNOSIS — G4733 Obstructive sleep apnea (adult) (pediatric): Secondary | ICD-10-CM | POA: Diagnosis not present

## 2021-11-23 DIAGNOSIS — I10 Essential (primary) hypertension: Secondary | ICD-10-CM | POA: Diagnosis not present

## 2021-11-23 DIAGNOSIS — G43909 Migraine, unspecified, not intractable, without status migrainosus: Secondary | ICD-10-CM | POA: Diagnosis not present

## 2021-11-23 DIAGNOSIS — Z87440 Personal history of urinary (tract) infections: Secondary | ICD-10-CM | POA: Diagnosis not present

## 2021-11-23 DIAGNOSIS — E538 Deficiency of other specified B group vitamins: Secondary | ICD-10-CM | POA: Diagnosis not present

## 2021-11-23 DIAGNOSIS — M5136 Other intervertebral disc degeneration, lumbar region: Secondary | ICD-10-CM | POA: Diagnosis not present

## 2021-11-23 DIAGNOSIS — I872 Venous insufficiency (chronic) (peripheral): Secondary | ICD-10-CM | POA: Diagnosis not present

## 2021-11-23 DIAGNOSIS — K760 Fatty (change of) liver, not elsewhere classified: Secondary | ICD-10-CM | POA: Diagnosis not present

## 2021-11-23 DIAGNOSIS — E559 Vitamin D deficiency, unspecified: Secondary | ICD-10-CM | POA: Diagnosis not present

## 2021-11-23 DIAGNOSIS — Z993 Dependence on wheelchair: Secondary | ICD-10-CM | POA: Diagnosis not present

## 2021-11-23 DIAGNOSIS — F32A Depression, unspecified: Secondary | ICD-10-CM | POA: Diagnosis not present

## 2021-11-23 DIAGNOSIS — Z9181 History of falling: Secondary | ICD-10-CM | POA: Diagnosis not present

## 2021-11-23 DIAGNOSIS — M4802 Spinal stenosis, cervical region: Secondary | ICD-10-CM | POA: Diagnosis not present

## 2021-11-23 DIAGNOSIS — M797 Fibromyalgia: Secondary | ICD-10-CM | POA: Diagnosis not present

## 2021-11-23 DIAGNOSIS — E785 Hyperlipidemia, unspecified: Secondary | ICD-10-CM | POA: Diagnosis not present

## 2021-11-23 DIAGNOSIS — G9529 Other cord compression: Secondary | ICD-10-CM | POA: Diagnosis not present

## 2021-11-25 DIAGNOSIS — Z993 Dependence on wheelchair: Secondary | ICD-10-CM | POA: Diagnosis not present

## 2021-11-25 DIAGNOSIS — G629 Polyneuropathy, unspecified: Secondary | ICD-10-CM | POA: Diagnosis not present

## 2021-11-25 DIAGNOSIS — I872 Venous insufficiency (chronic) (peripheral): Secondary | ICD-10-CM | POA: Diagnosis not present

## 2021-11-25 DIAGNOSIS — L989 Disorder of the skin and subcutaneous tissue, unspecified: Secondary | ICD-10-CM | POA: Diagnosis not present

## 2021-11-25 DIAGNOSIS — E538 Deficiency of other specified B group vitamins: Secondary | ICD-10-CM | POA: Diagnosis not present

## 2021-11-25 DIAGNOSIS — G9589 Other specified diseases of spinal cord: Secondary | ICD-10-CM | POA: Diagnosis not present

## 2021-11-25 DIAGNOSIS — K219 Gastro-esophageal reflux disease without esophagitis: Secondary | ICD-10-CM | POA: Diagnosis not present

## 2021-11-25 DIAGNOSIS — K760 Fatty (change of) liver, not elsewhere classified: Secondary | ICD-10-CM | POA: Diagnosis not present

## 2021-11-25 DIAGNOSIS — G43909 Migraine, unspecified, not intractable, without status migrainosus: Secondary | ICD-10-CM | POA: Diagnosis not present

## 2021-11-25 DIAGNOSIS — M797 Fibromyalgia: Secondary | ICD-10-CM | POA: Diagnosis not present

## 2021-11-25 DIAGNOSIS — M5136 Other intervertebral disc degeneration, lumbar region: Secondary | ICD-10-CM | POA: Diagnosis not present

## 2021-11-25 DIAGNOSIS — F32A Depression, unspecified: Secondary | ICD-10-CM | POA: Diagnosis not present

## 2021-11-25 DIAGNOSIS — G9529 Other cord compression: Secondary | ICD-10-CM | POA: Diagnosis not present

## 2021-11-25 DIAGNOSIS — Z9181 History of falling: Secondary | ICD-10-CM | POA: Diagnosis not present

## 2021-11-25 DIAGNOSIS — M4802 Spinal stenosis, cervical region: Secondary | ICD-10-CM | POA: Diagnosis not present

## 2021-11-25 DIAGNOSIS — K259 Gastric ulcer, unspecified as acute or chronic, without hemorrhage or perforation: Secondary | ICD-10-CM | POA: Diagnosis not present

## 2021-11-25 DIAGNOSIS — E559 Vitamin D deficiency, unspecified: Secondary | ICD-10-CM | POA: Diagnosis not present

## 2021-11-25 DIAGNOSIS — M300 Polyarteritis nodosa: Secondary | ICD-10-CM | POA: Diagnosis not present

## 2021-11-25 DIAGNOSIS — E785 Hyperlipidemia, unspecified: Secondary | ICD-10-CM | POA: Diagnosis not present

## 2021-11-25 DIAGNOSIS — I10 Essential (primary) hypertension: Secondary | ICD-10-CM | POA: Diagnosis not present

## 2021-11-25 DIAGNOSIS — Z87440 Personal history of urinary (tract) infections: Secondary | ICD-10-CM | POA: Diagnosis not present

## 2021-11-25 DIAGNOSIS — G4733 Obstructive sleep apnea (adult) (pediatric): Secondary | ICD-10-CM | POA: Diagnosis not present

## 2021-11-29 ENCOUNTER — Other Ambulatory Visit: Payer: Self-pay | Admitting: Family Medicine

## 2021-11-29 ENCOUNTER — Telehealth: Payer: Self-pay | Admitting: Pharmacist

## 2021-11-29 ENCOUNTER — Ambulatory Visit: Payer: Medicare Other | Admitting: Family Medicine

## 2021-11-29 DIAGNOSIS — Z87440 Personal history of urinary (tract) infections: Secondary | ICD-10-CM | POA: Diagnosis not present

## 2021-11-29 DIAGNOSIS — M797 Fibromyalgia: Secondary | ICD-10-CM | POA: Diagnosis not present

## 2021-11-29 DIAGNOSIS — F32A Depression, unspecified: Secondary | ICD-10-CM | POA: Diagnosis not present

## 2021-11-29 DIAGNOSIS — G629 Polyneuropathy, unspecified: Secondary | ICD-10-CM | POA: Diagnosis not present

## 2021-11-29 DIAGNOSIS — I10 Essential (primary) hypertension: Secondary | ICD-10-CM | POA: Diagnosis not present

## 2021-11-29 DIAGNOSIS — K760 Fatty (change of) liver, not elsewhere classified: Secondary | ICD-10-CM | POA: Diagnosis not present

## 2021-11-29 DIAGNOSIS — G43909 Migraine, unspecified, not intractable, without status migrainosus: Secondary | ICD-10-CM | POA: Diagnosis not present

## 2021-11-29 DIAGNOSIS — K259 Gastric ulcer, unspecified as acute or chronic, without hemorrhage or perforation: Secondary | ICD-10-CM | POA: Diagnosis not present

## 2021-11-29 DIAGNOSIS — G4733 Obstructive sleep apnea (adult) (pediatric): Secondary | ICD-10-CM | POA: Diagnosis not present

## 2021-11-29 DIAGNOSIS — G9529 Other cord compression: Secondary | ICD-10-CM | POA: Diagnosis not present

## 2021-11-29 DIAGNOSIS — M5136 Other intervertebral disc degeneration, lumbar region: Secondary | ICD-10-CM | POA: Diagnosis not present

## 2021-11-29 DIAGNOSIS — E538 Deficiency of other specified B group vitamins: Secondary | ICD-10-CM | POA: Diagnosis not present

## 2021-11-29 DIAGNOSIS — I872 Venous insufficiency (chronic) (peripheral): Secondary | ICD-10-CM | POA: Diagnosis not present

## 2021-11-29 DIAGNOSIS — G9589 Other specified diseases of spinal cord: Secondary | ICD-10-CM | POA: Diagnosis not present

## 2021-11-29 DIAGNOSIS — Z993 Dependence on wheelchair: Secondary | ICD-10-CM | POA: Diagnosis not present

## 2021-11-29 DIAGNOSIS — L989 Disorder of the skin and subcutaneous tissue, unspecified: Secondary | ICD-10-CM | POA: Diagnosis not present

## 2021-11-29 DIAGNOSIS — M300 Polyarteritis nodosa: Secondary | ICD-10-CM | POA: Diagnosis not present

## 2021-11-29 DIAGNOSIS — K219 Gastro-esophageal reflux disease without esophagitis: Secondary | ICD-10-CM | POA: Diagnosis not present

## 2021-11-29 DIAGNOSIS — E559 Vitamin D deficiency, unspecified: Secondary | ICD-10-CM | POA: Diagnosis not present

## 2021-11-29 DIAGNOSIS — Z9181 History of falling: Secondary | ICD-10-CM | POA: Diagnosis not present

## 2021-11-29 DIAGNOSIS — E785 Hyperlipidemia, unspecified: Secondary | ICD-10-CM | POA: Diagnosis not present

## 2021-11-29 DIAGNOSIS — M4802 Spinal stenosis, cervical region: Secondary | ICD-10-CM | POA: Diagnosis not present

## 2021-11-29 NOTE — Progress Notes (Deleted)
Chronic Care Management Pharmacy Assistant   Name: [name]  MRN: [id number] DOB: January 06, 1971   Reason for Encounter: Disease State - General Adherence Call     Recent office visits:  ***  Recent consult visits:  [facility] visits: 08/12/21 - 08/15/21 Medication Reconciliation was completed by comparing discharge summary, patient's EMR and Pharmacy list, and upon discussion with patient.   Admitted to the hospital on 08/12/21 due to Elevated LFTs. Discharge date was 08/15/21. Discharged from [facility]?Medications Started at [facility] Discharge:?? None noted.    Medication Changes at Hospital Discharge: None noted.    Medications Discontinued at Hospital Discharge: nitrofurantoin (macrocrystal-monohydrate) 100 MG capsule (Macrobid)   Medications that remain the same after Hospital Discharge:??  All other medications will remain the same.     Hospital visits: 06/04/21 - 06/10/21 Medication Reconciliation was completed by comparing discharge summary, patient's EMR and Pharmacy list, and upon discussion with patient.   Admitted to the hospital on 06/04/21 due to Weakness. Discharge date was 06/10/21. Discharged from [place]?Medications Started at [facility] Discharge:?? ciprofloxacin (CIPRO) folic acid (FOLVITE) pyridoxine (B-6) thiamine vitamin B-12   Medication Changes at Hospital Discharge: None noted.    Medications Discontinued at Hospital Discharge: clobetasol cream 0.05 % (TEMOVATE) doxycycline 100 MG tablet (VIBRA-TABS) fluconazole 100 MG tablet (Diflucan) HYDROcodone-acetaminophen 10-325 MG tablet (NORCO) irbesartan 75 MG tablet (AVAPRO) meclizine 25 MG tablet (ANTIVERT) mupirocin ointment 2 % (BACTROBAN) nystatin powder   Medications that remain the same after Hospital Discharge:??  All other medications will remain the same  Medications: Outpatient Encounter Medications as of 11/29/2021  Medication Sig Note    ALPRAZolam (XANAX) 0.5 MG tablet TAKE 1 TABLET BY MOUTH 2 TIMES DAILY    ARIPiprazole (ABILIFY) 15 MG tablet Take 1 tablet (15 mg total) by mouth daily.    Armodafinil 250 MG tablet Take 250 mg by mouth in the morning and at bedtime. 08/13/2021: Pt was prescribed to take '250mg'$  TID 8am, 2pm, 6pm, however pt has been taking bid lately.   Biotin 10 MG TABS Take 10 mg by mouth in the morning.     buPROPion (WELLBUTRIN XL) 300 MG 24 hr tablet TAKE 1 TABLET BY MOUTH EVERY DAY    calcium citrate (CALCITRATE - DOSED IN MG ELEMENTAL CALCIUM) 950 MG tablet Take 1 tablet by mouth daily.    clonazePAM (KLONOPIN) 1 MG tablet TAKE 1 TABLET BY MOUTH AT BEDTIME    cyclobenzaprine (FLEXERIL) 10 MG tablet TAKE 2 TABLETS BY MOUTH 2 TIMES DAILY    diclofenac Sodium (VOLTAREN) 1 % GEL Apply 4 g topically 4 (four) times daily.    diphenhydrAMINE (BENADRYL) 25 MG tablet Take 25 mg by mouth 2 (two) times daily as needed for allergies.    DULoxetine (CYMBALTA) 60 MG capsule Take 2 capsules (120 mg total) by mouth daily. (Patient taking differently: Take 60 mg by mouth 2 (two) times daily.)    FEROSUL 325 (65 Fe) MG tablet TAKE 1 TABLET BY MOUTH EVERY DAY WITH BREAKFAST    folic acid (FOLVITE) 1 MG tablet TAKE 1 TABLET BY MOUTH EVERY DAY    furosemide (LASIX) 40 MG tablet TAKE 1 TABLET BY MOUTH EVERY DAY MAY take additional TABLET IF weight INCREASE by 5lb AND call MD FOR further instructions (Patient taking differently: Take 40 mg by mouth daily. 1t qd-may take an additional tablet if weight increases by 5lbs.) 08/13/2021: Per pt's  son, pt has not been taking this medication recently as she has been attached to a purewick, and taking the Lasix while attached to the [place] was creating "issues" per son.   gabapentin (NEURONTIN) 600 MG tablet Take 1 tablet (600 mg total) by mouth 3 (three) times daily. TAKE 2 TABLETS BY MOUTH 3 TIMES DAILY (Patient taking differently: Take 600 mg by mouth 3 (three) times daily.)     HYDROcodone-acetaminophen (NORCO) 10-325 MG tablet TAKE 1 TABLET BY MOUTH EVERY 6 HOURS AS NEEDED    hydrOXYzine (VISTARIL) 25 MG capsule Take 1 capsule (25 mg total) by mouth every 8 (eight) hours as needed.    nebivolol (BYSTOLIC) 10 MG tablet TAKE 1 TABLET BY MOUTH EVERY DAY    nystatin (MYCOSTATIN/NYSTOP) powder APPLY 1 APPLICATION TOPICALLY 3 TIMES DAILY    Oxcarbazepine (TRILEPTAL) 300 MG tablet Take 300 mg by mouth 2 (two) times daily.    pantoprazole (PROTONIX) 40 MG tablet TAKE 1 TABLET BY MOUTH 2 TIMES DAILY    phentermine 37.5 MG capsule Take 1 capsule (37.5 mg total) by mouth every morning.    promethazine (PHENERGAN) 25 MG tablet TAKE 1 TABLET BY MOUTH EVERY 6 HOURS AS NEEDED FOR NAUSEA AND VOMITING    pyridOXINE (VITAMIN B-6) 100 MG tablet TAKE 1 TABLET BY MOUTH EVERY DAY    rosuvastatin (CRESTOR) 20 MG tablet TAKE 1 TABLET BY MOUTH EVERY DAY    senna-docusate (SENOKOT-S) 8.6-50 MG tablet Take 1 tablet by mouth at bedtime. (Patient taking differently: Take 1 tablet by mouth daily as needed for mild constipation.)    SKYRIZI 150 MG/ML SOSY Inject 150 mg into the skin every 3 (three) months.    thiamine 100 MG tablet TAKE 1 TABLET BY MOUTH EVERY DAY    vitamin B-12 (CYANOCOBALAMIN) 1000 MCG tablet TAKE 1 TABLET BY MOUTH EVERY DAY    No facility-administered encounter medications on file as of 11/29/2021.    Current antihypertensive regimen:  ***  How often are you checking your Blood Pressure? {CHL HP BP Monitoring Frequency:[phone]}   Current home BP readings: ***   What recent interventions/DTPs have been made by any provider to improve Blood Pressure control since last CPP Visit: ***   Any recent hospitalizations or ED visits since last visit with CPP? {yes/no:20286}   What diet changes have been made to improve Blood Pressure Control?  ***   What exercise is being done to improve your Blood Pressure Control?  ***    Adherence Review: Is the patient  currently on ACE/ARB medication? {yes/no:20286} Does the patient have >5 day gap between last estimated fill dates?    Care Gaps  AWV: Colonoscopy:  DM Eye Exam:  DM Foot Exam: Microalbumin: HbgAIC: DEXA:  Mammogram:   Star Rating Drugs:    Future Appointments  Date Time Provider [place]  12/02/2021  2:40 PM [name], MD [facility]  01/11/2022  2:00 PM Marcial Pacas, MD [facility] None     Jobe Gibbon, [facility] Clinical Pharmacist Assistant  [phone]

## 2021-11-29 NOTE — Progress Notes (Signed)
Error /pt has MD appt this month with PCP will follow up with patient next month.  Jobe Gibbon, [facility] Clinical Pharmacist Assistant  [phone]

## 2021-12-01 DIAGNOSIS — E785 Hyperlipidemia, unspecified: Secondary | ICD-10-CM | POA: Diagnosis not present

## 2021-12-01 DIAGNOSIS — G9589 Other specified diseases of spinal cord: Secondary | ICD-10-CM | POA: Diagnosis not present

## 2021-12-01 DIAGNOSIS — Z993 Dependence on wheelchair: Secondary | ICD-10-CM | POA: Diagnosis not present

## 2021-12-01 DIAGNOSIS — K760 Fatty (change of) liver, not elsewhere classified: Secondary | ICD-10-CM | POA: Diagnosis not present

## 2021-12-01 DIAGNOSIS — K259 Gastric ulcer, unspecified as acute or chronic, without hemorrhage or perforation: Secondary | ICD-10-CM | POA: Diagnosis not present

## 2021-12-01 DIAGNOSIS — E559 Vitamin D deficiency, unspecified: Secondary | ICD-10-CM | POA: Diagnosis not present

## 2021-12-01 DIAGNOSIS — G43909 Migraine, unspecified, not intractable, without status migrainosus: Secondary | ICD-10-CM | POA: Diagnosis not present

## 2021-12-01 DIAGNOSIS — M5136 Other intervertebral disc degeneration, lumbar region: Secondary | ICD-10-CM | POA: Diagnosis not present

## 2021-12-01 DIAGNOSIS — M797 Fibromyalgia: Secondary | ICD-10-CM | POA: Diagnosis not present

## 2021-12-01 DIAGNOSIS — E538 Deficiency of other specified B group vitamins: Secondary | ICD-10-CM | POA: Diagnosis not present

## 2021-12-01 DIAGNOSIS — Z9181 History of falling: Secondary | ICD-10-CM | POA: Diagnosis not present

## 2021-12-01 DIAGNOSIS — L989 Disorder of the skin and subcutaneous tissue, unspecified: Secondary | ICD-10-CM | POA: Diagnosis not present

## 2021-12-01 DIAGNOSIS — K219 Gastro-esophageal reflux disease without esophagitis: Secondary | ICD-10-CM | POA: Diagnosis not present

## 2021-12-01 DIAGNOSIS — F32A Depression, unspecified: Secondary | ICD-10-CM | POA: Diagnosis not present

## 2021-12-01 DIAGNOSIS — G9529 Other cord compression: Secondary | ICD-10-CM | POA: Diagnosis not present

## 2021-12-01 DIAGNOSIS — Z87440 Personal history of urinary (tract) infections: Secondary | ICD-10-CM | POA: Diagnosis not present

## 2021-12-01 DIAGNOSIS — M300 Polyarteritis nodosa: Secondary | ICD-10-CM | POA: Diagnosis not present

## 2021-12-01 DIAGNOSIS — I10 Essential (primary) hypertension: Secondary | ICD-10-CM | POA: Diagnosis not present

## 2021-12-01 DIAGNOSIS — I872 Venous insufficiency (chronic) (peripheral): Secondary | ICD-10-CM | POA: Diagnosis not present

## 2021-12-01 DIAGNOSIS — G629 Polyneuropathy, unspecified: Secondary | ICD-10-CM | POA: Diagnosis not present

## 2021-12-01 DIAGNOSIS — G4733 Obstructive sleep apnea (adult) (pediatric): Secondary | ICD-10-CM | POA: Diagnosis not present

## 2021-12-01 DIAGNOSIS — M4802 Spinal stenosis, cervical region: Secondary | ICD-10-CM | POA: Diagnosis not present

## 2021-12-02 ENCOUNTER — Ambulatory Visit: Payer: Medicare Other | Admitting: Family Medicine

## 2021-12-03 DIAGNOSIS — E559 Vitamin D deficiency, unspecified: Secondary | ICD-10-CM | POA: Diagnosis not present

## 2021-12-03 DIAGNOSIS — M300 Polyarteritis nodosa: Secondary | ICD-10-CM | POA: Diagnosis not present

## 2021-12-03 DIAGNOSIS — Z87440 Personal history of urinary (tract) infections: Secondary | ICD-10-CM | POA: Diagnosis not present

## 2021-12-03 DIAGNOSIS — K259 Gastric ulcer, unspecified as acute or chronic, without hemorrhage or perforation: Secondary | ICD-10-CM | POA: Diagnosis not present

## 2021-12-03 DIAGNOSIS — F32A Depression, unspecified: Secondary | ICD-10-CM | POA: Diagnosis not present

## 2021-12-03 DIAGNOSIS — G43909 Migraine, unspecified, not intractable, without status migrainosus: Secondary | ICD-10-CM | POA: Diagnosis not present

## 2021-12-03 DIAGNOSIS — G629 Polyneuropathy, unspecified: Secondary | ICD-10-CM | POA: Diagnosis not present

## 2021-12-03 DIAGNOSIS — M797 Fibromyalgia: Secondary | ICD-10-CM | POA: Diagnosis not present

## 2021-12-03 DIAGNOSIS — M5136 Other intervertebral disc degeneration, lumbar region: Secondary | ICD-10-CM | POA: Diagnosis not present

## 2021-12-03 DIAGNOSIS — K219 Gastro-esophageal reflux disease without esophagitis: Secondary | ICD-10-CM | POA: Diagnosis not present

## 2021-12-03 DIAGNOSIS — L989 Disorder of the skin and subcutaneous tissue, unspecified: Secondary | ICD-10-CM | POA: Diagnosis not present

## 2021-12-03 DIAGNOSIS — M4802 Spinal stenosis, cervical region: Secondary | ICD-10-CM | POA: Diagnosis not present

## 2021-12-03 DIAGNOSIS — Z993 Dependence on wheelchair: Secondary | ICD-10-CM | POA: Diagnosis not present

## 2021-12-03 DIAGNOSIS — I872 Venous insufficiency (chronic) (peripheral): Secondary | ICD-10-CM | POA: Diagnosis not present

## 2021-12-03 DIAGNOSIS — G9589 Other specified diseases of spinal cord: Secondary | ICD-10-CM | POA: Diagnosis not present

## 2021-12-03 DIAGNOSIS — G9529 Other cord compression: Secondary | ICD-10-CM | POA: Diagnosis not present

## 2021-12-03 DIAGNOSIS — K760 Fatty (change of) liver, not elsewhere classified: Secondary | ICD-10-CM | POA: Diagnosis not present

## 2021-12-03 DIAGNOSIS — E785 Hyperlipidemia, unspecified: Secondary | ICD-10-CM | POA: Diagnosis not present

## 2021-12-03 DIAGNOSIS — E538 Deficiency of other specified B group vitamins: Secondary | ICD-10-CM | POA: Diagnosis not present

## 2021-12-03 DIAGNOSIS — I10 Essential (primary) hypertension: Secondary | ICD-10-CM | POA: Diagnosis not present

## 2021-12-03 DIAGNOSIS — Z9181 History of falling: Secondary | ICD-10-CM | POA: Diagnosis not present

## 2021-12-03 DIAGNOSIS — G4733 Obstructive sleep apnea (adult) (pediatric): Secondary | ICD-10-CM | POA: Diagnosis not present

## 2021-12-04 DIAGNOSIS — Z87440 Personal history of urinary (tract) infections: Secondary | ICD-10-CM | POA: Diagnosis not present

## 2021-12-04 DIAGNOSIS — M797 Fibromyalgia: Secondary | ICD-10-CM | POA: Diagnosis not present

## 2021-12-04 DIAGNOSIS — G9589 Other specified diseases of spinal cord: Secondary | ICD-10-CM | POA: Diagnosis not present

## 2021-12-04 DIAGNOSIS — G4733 Obstructive sleep apnea (adult) (pediatric): Secondary | ICD-10-CM | POA: Diagnosis not present

## 2021-12-04 DIAGNOSIS — Z993 Dependence on wheelchair: Secondary | ICD-10-CM | POA: Diagnosis not present

## 2021-12-04 DIAGNOSIS — K219 Gastro-esophageal reflux disease without esophagitis: Secondary | ICD-10-CM | POA: Diagnosis not present

## 2021-12-04 DIAGNOSIS — G629 Polyneuropathy, unspecified: Secondary | ICD-10-CM | POA: Diagnosis not present

## 2021-12-04 DIAGNOSIS — E559 Vitamin D deficiency, unspecified: Secondary | ICD-10-CM | POA: Diagnosis not present

## 2021-12-04 DIAGNOSIS — I872 Venous insufficiency (chronic) (peripheral): Secondary | ICD-10-CM | POA: Diagnosis not present

## 2021-12-04 DIAGNOSIS — G43909 Migraine, unspecified, not intractable, without status migrainosus: Secondary | ICD-10-CM | POA: Diagnosis not present

## 2021-12-04 DIAGNOSIS — M4802 Spinal stenosis, cervical region: Secondary | ICD-10-CM | POA: Diagnosis not present

## 2021-12-04 DIAGNOSIS — E538 Deficiency of other specified B group vitamins: Secondary | ICD-10-CM | POA: Diagnosis not present

## 2021-12-04 DIAGNOSIS — Z9181 History of falling: Secondary | ICD-10-CM | POA: Diagnosis not present

## 2021-12-04 DIAGNOSIS — K760 Fatty (change of) liver, not elsewhere classified: Secondary | ICD-10-CM | POA: Diagnosis not present

## 2021-12-04 DIAGNOSIS — L989 Disorder of the skin and subcutaneous tissue, unspecified: Secondary | ICD-10-CM | POA: Diagnosis not present

## 2021-12-04 DIAGNOSIS — M300 Polyarteritis nodosa: Secondary | ICD-10-CM | POA: Diagnosis not present

## 2021-12-04 DIAGNOSIS — K259 Gastric ulcer, unspecified as acute or chronic, without hemorrhage or perforation: Secondary | ICD-10-CM | POA: Diagnosis not present

## 2021-12-04 DIAGNOSIS — F32A Depression, unspecified: Secondary | ICD-10-CM | POA: Diagnosis not present

## 2021-12-04 DIAGNOSIS — M5136 Other intervertebral disc degeneration, lumbar region: Secondary | ICD-10-CM | POA: Diagnosis not present

## 2021-12-04 DIAGNOSIS — I10 Essential (primary) hypertension: Secondary | ICD-10-CM | POA: Diagnosis not present

## 2021-12-04 DIAGNOSIS — E785 Hyperlipidemia, unspecified: Secondary | ICD-10-CM | POA: Diagnosis not present

## 2021-12-04 DIAGNOSIS — G9529 Other cord compression: Secondary | ICD-10-CM | POA: Diagnosis not present

## 2021-12-05 DIAGNOSIS — G43909 Migraine, unspecified, not intractable, without status migrainosus: Secondary | ICD-10-CM | POA: Diagnosis not present

## 2021-12-05 DIAGNOSIS — Z87440 Personal history of urinary (tract) infections: Secondary | ICD-10-CM | POA: Diagnosis not present

## 2021-12-05 DIAGNOSIS — I872 Venous insufficiency (chronic) (peripheral): Secondary | ICD-10-CM | POA: Diagnosis not present

## 2021-12-05 DIAGNOSIS — F32A Depression, unspecified: Secondary | ICD-10-CM | POA: Diagnosis not present

## 2021-12-05 DIAGNOSIS — G9589 Other specified diseases of spinal cord: Secondary | ICD-10-CM | POA: Diagnosis not present

## 2021-12-05 DIAGNOSIS — G629 Polyneuropathy, unspecified: Secondary | ICD-10-CM | POA: Diagnosis not present

## 2021-12-05 DIAGNOSIS — Z9181 History of falling: Secondary | ICD-10-CM | POA: Diagnosis not present

## 2021-12-05 DIAGNOSIS — M4802 Spinal stenosis, cervical region: Secondary | ICD-10-CM | POA: Diagnosis not present

## 2021-12-05 DIAGNOSIS — L989 Disorder of the skin and subcutaneous tissue, unspecified: Secondary | ICD-10-CM | POA: Diagnosis not present

## 2021-12-05 DIAGNOSIS — M797 Fibromyalgia: Secondary | ICD-10-CM | POA: Diagnosis not present

## 2021-12-05 DIAGNOSIS — G9529 Other cord compression: Secondary | ICD-10-CM | POA: Diagnosis not present

## 2021-12-05 DIAGNOSIS — Z993 Dependence on wheelchair: Secondary | ICD-10-CM | POA: Diagnosis not present

## 2021-12-05 DIAGNOSIS — K219 Gastro-esophageal reflux disease without esophagitis: Secondary | ICD-10-CM | POA: Diagnosis not present

## 2021-12-05 DIAGNOSIS — K760 Fatty (change of) liver, not elsewhere classified: Secondary | ICD-10-CM | POA: Diagnosis not present

## 2021-12-05 DIAGNOSIS — E785 Hyperlipidemia, unspecified: Secondary | ICD-10-CM | POA: Diagnosis not present

## 2021-12-05 DIAGNOSIS — M300 Polyarteritis nodosa: Secondary | ICD-10-CM | POA: Diagnosis not present

## 2021-12-05 DIAGNOSIS — G4733 Obstructive sleep apnea (adult) (pediatric): Secondary | ICD-10-CM | POA: Diagnosis not present

## 2021-12-05 DIAGNOSIS — M5136 Other intervertebral disc degeneration, lumbar region: Secondary | ICD-10-CM | POA: Diagnosis not present

## 2021-12-05 DIAGNOSIS — E559 Vitamin D deficiency, unspecified: Secondary | ICD-10-CM | POA: Diagnosis not present

## 2021-12-05 DIAGNOSIS — E538 Deficiency of other specified B group vitamins: Secondary | ICD-10-CM | POA: Diagnosis not present

## 2021-12-05 DIAGNOSIS — I10 Essential (primary) hypertension: Secondary | ICD-10-CM | POA: Diagnosis not present

## 2021-12-05 DIAGNOSIS — K259 Gastric ulcer, unspecified as acute or chronic, without hemorrhage or perforation: Secondary | ICD-10-CM | POA: Diagnosis not present

## 2021-12-07 DIAGNOSIS — G629 Polyneuropathy, unspecified: Secondary | ICD-10-CM | POA: Diagnosis not present

## 2021-12-07 DIAGNOSIS — E871 Hypo-osmolality and hyponatremia: Secondary | ICD-10-CM | POA: Diagnosis not present

## 2021-12-07 DIAGNOSIS — G43909 Migraine, unspecified, not intractable, without status migrainosus: Secondary | ICD-10-CM | POA: Diagnosis not present

## 2021-12-07 DIAGNOSIS — M300 Polyarteritis nodosa: Secondary | ICD-10-CM | POA: Diagnosis not present

## 2021-12-07 DIAGNOSIS — M4802 Spinal stenosis, cervical region: Secondary | ICD-10-CM | POA: Diagnosis not present

## 2021-12-07 DIAGNOSIS — A419 Sepsis, unspecified organism: Secondary | ICD-10-CM | POA: Diagnosis not present

## 2021-12-07 DIAGNOSIS — K760 Fatty (change of) liver, not elsewhere classified: Secondary | ICD-10-CM | POA: Diagnosis not present

## 2021-12-07 DIAGNOSIS — F32A Depression, unspecified: Secondary | ICD-10-CM | POA: Diagnosis not present

## 2021-12-07 DIAGNOSIS — M797 Fibromyalgia: Secondary | ICD-10-CM | POA: Diagnosis not present

## 2021-12-07 DIAGNOSIS — E538 Deficiency of other specified B group vitamins: Secondary | ICD-10-CM | POA: Diagnosis not present

## 2021-12-07 DIAGNOSIS — E559 Vitamin D deficiency, unspecified: Secondary | ICD-10-CM | POA: Diagnosis not present

## 2021-12-07 DIAGNOSIS — Z9181 History of falling: Secondary | ICD-10-CM | POA: Diagnosis not present

## 2021-12-07 DIAGNOSIS — I872 Venous insufficiency (chronic) (peripheral): Secondary | ICD-10-CM | POA: Diagnosis not present

## 2021-12-07 DIAGNOSIS — M5136 Other intervertebral disc degeneration, lumbar region: Secondary | ICD-10-CM | POA: Diagnosis not present

## 2021-12-07 DIAGNOSIS — G4733 Obstructive sleep apnea (adult) (pediatric): Secondary | ICD-10-CM | POA: Diagnosis not present

## 2021-12-07 DIAGNOSIS — E785 Hyperlipidemia, unspecified: Secondary | ICD-10-CM | POA: Diagnosis not present

## 2021-12-07 DIAGNOSIS — L989 Disorder of the skin and subcutaneous tissue, unspecified: Secondary | ICD-10-CM | POA: Diagnosis not present

## 2021-12-07 DIAGNOSIS — G9589 Other specified diseases of spinal cord: Secondary | ICD-10-CM | POA: Diagnosis not present

## 2021-12-07 DIAGNOSIS — I10 Essential (primary) hypertension: Secondary | ICD-10-CM | POA: Diagnosis not present

## 2021-12-07 DIAGNOSIS — G9529 Other cord compression: Secondary | ICD-10-CM | POA: Diagnosis not present

## 2021-12-07 DIAGNOSIS — Z87440 Personal history of urinary (tract) infections: Secondary | ICD-10-CM | POA: Diagnosis not present

## 2021-12-07 DIAGNOSIS — Z993 Dependence on wheelchair: Secondary | ICD-10-CM | POA: Diagnosis not present

## 2021-12-07 DIAGNOSIS — K259 Gastric ulcer, unspecified as acute or chronic, without hemorrhage or perforation: Secondary | ICD-10-CM | POA: Diagnosis not present

## 2021-12-07 DIAGNOSIS — K219 Gastro-esophageal reflux disease without esophagitis: Secondary | ICD-10-CM | POA: Diagnosis not present

## 2021-12-08 ENCOUNTER — Other Ambulatory Visit: Payer: Self-pay | Admitting: Family Medicine

## 2021-12-08 DIAGNOSIS — G4733 Obstructive sleep apnea (adult) (pediatric): Secondary | ICD-10-CM | POA: Diagnosis not present

## 2021-12-08 DIAGNOSIS — K760 Fatty (change of) liver, not elsewhere classified: Secondary | ICD-10-CM | POA: Diagnosis not present

## 2021-12-08 DIAGNOSIS — K219 Gastro-esophageal reflux disease without esophagitis: Secondary | ICD-10-CM | POA: Diagnosis not present

## 2021-12-08 DIAGNOSIS — Z993 Dependence on wheelchair: Secondary | ICD-10-CM | POA: Diagnosis not present

## 2021-12-08 DIAGNOSIS — F32A Depression, unspecified: Secondary | ICD-10-CM | POA: Diagnosis not present

## 2021-12-08 DIAGNOSIS — I10 Essential (primary) hypertension: Secondary | ICD-10-CM | POA: Diagnosis not present

## 2021-12-08 DIAGNOSIS — I872 Venous insufficiency (chronic) (peripheral): Secondary | ICD-10-CM | POA: Diagnosis not present

## 2021-12-08 DIAGNOSIS — E559 Vitamin D deficiency, unspecified: Secondary | ICD-10-CM | POA: Diagnosis not present

## 2021-12-08 DIAGNOSIS — Z9181 History of falling: Secondary | ICD-10-CM | POA: Diagnosis not present

## 2021-12-08 DIAGNOSIS — K259 Gastric ulcer, unspecified as acute or chronic, without hemorrhage or perforation: Secondary | ICD-10-CM | POA: Diagnosis not present

## 2021-12-08 DIAGNOSIS — G9529 Other cord compression: Secondary | ICD-10-CM | POA: Diagnosis not present

## 2021-12-08 DIAGNOSIS — E538 Deficiency of other specified B group vitamins: Secondary | ICD-10-CM | POA: Diagnosis not present

## 2021-12-08 DIAGNOSIS — Z87440 Personal history of urinary (tract) infections: Secondary | ICD-10-CM | POA: Diagnosis not present

## 2021-12-08 DIAGNOSIS — M4802 Spinal stenosis, cervical region: Secondary | ICD-10-CM | POA: Diagnosis not present

## 2021-12-08 DIAGNOSIS — G9589 Other specified diseases of spinal cord: Secondary | ICD-10-CM | POA: Diagnosis not present

## 2021-12-08 DIAGNOSIS — L989 Disorder of the skin and subcutaneous tissue, unspecified: Secondary | ICD-10-CM | POA: Diagnosis not present

## 2021-12-08 DIAGNOSIS — E785 Hyperlipidemia, unspecified: Secondary | ICD-10-CM | POA: Diagnosis not present

## 2021-12-08 DIAGNOSIS — M5136 Other intervertebral disc degeneration, lumbar region: Secondary | ICD-10-CM | POA: Diagnosis not present

## 2021-12-08 DIAGNOSIS — G43909 Migraine, unspecified, not intractable, without status migrainosus: Secondary | ICD-10-CM | POA: Diagnosis not present

## 2021-12-08 DIAGNOSIS — M797 Fibromyalgia: Secondary | ICD-10-CM | POA: Diagnosis not present

## 2021-12-08 DIAGNOSIS — G629 Polyneuropathy, unspecified: Secondary | ICD-10-CM | POA: Diagnosis not present

## 2021-12-08 DIAGNOSIS — M300 Polyarteritis nodosa: Secondary | ICD-10-CM | POA: Diagnosis not present

## 2021-12-09 ENCOUNTER — Telehealth: Payer: Self-pay | Admitting: Family Medicine

## 2021-12-09 NOTE — Telephone Encounter (Signed)
Spoke w/ pt and ask if she can bring me the forms  or see if she can have someone fax them to me . She states she is coming in on Monday 12/13/21 to see Dr Birdie Riddle but she will be on a stretcher . I ask her if she can bring the forms then and typically DR Birdie Riddle has 7-10 days  to fill out the forms . Pt expressed verbal understanding

## 2021-12-09 NOTE — Telephone Encounter (Signed)
Caller name: Janette (pt  On DPR? :yes/no: Yes  Call back number: [phone]  Provider they see: Dr. Birdie Riddle  Reason for call: Pt called inquiring if we had received her paperwork.  She wanted to speak to [place], but she said to route to the clinical pool.

## 2021-12-10 DIAGNOSIS — E559 Vitamin D deficiency, unspecified: Secondary | ICD-10-CM | POA: Diagnosis not present

## 2021-12-10 DIAGNOSIS — M797 Fibromyalgia: Secondary | ICD-10-CM | POA: Diagnosis not present

## 2021-12-10 DIAGNOSIS — K219 Gastro-esophageal reflux disease without esophagitis: Secondary | ICD-10-CM | POA: Diagnosis not present

## 2021-12-10 DIAGNOSIS — M300 Polyarteritis nodosa: Secondary | ICD-10-CM | POA: Diagnosis not present

## 2021-12-10 DIAGNOSIS — Z993 Dependence on wheelchair: Secondary | ICD-10-CM | POA: Diagnosis not present

## 2021-12-10 DIAGNOSIS — G4733 Obstructive sleep apnea (adult) (pediatric): Secondary | ICD-10-CM | POA: Diagnosis not present

## 2021-12-10 DIAGNOSIS — E785 Hyperlipidemia, unspecified: Secondary | ICD-10-CM | POA: Diagnosis not present

## 2021-12-10 DIAGNOSIS — L989 Disorder of the skin and subcutaneous tissue, unspecified: Secondary | ICD-10-CM | POA: Diagnosis not present

## 2021-12-10 DIAGNOSIS — I10 Essential (primary) hypertension: Secondary | ICD-10-CM | POA: Diagnosis not present

## 2021-12-10 DIAGNOSIS — M5136 Other intervertebral disc degeneration, lumbar region: Secondary | ICD-10-CM | POA: Diagnosis not present

## 2021-12-10 DIAGNOSIS — G43909 Migraine, unspecified, not intractable, without status migrainosus: Secondary | ICD-10-CM | POA: Diagnosis not present

## 2021-12-10 DIAGNOSIS — G9589 Other specified diseases of spinal cord: Secondary | ICD-10-CM | POA: Diagnosis not present

## 2021-12-10 DIAGNOSIS — F32A Depression, unspecified: Secondary | ICD-10-CM | POA: Diagnosis not present

## 2021-12-10 DIAGNOSIS — Z87440 Personal history of urinary (tract) infections: Secondary | ICD-10-CM | POA: Diagnosis not present

## 2021-12-10 DIAGNOSIS — I872 Venous insufficiency (chronic) (peripheral): Secondary | ICD-10-CM | POA: Diagnosis not present

## 2021-12-10 DIAGNOSIS — K259 Gastric ulcer, unspecified as acute or chronic, without hemorrhage or perforation: Secondary | ICD-10-CM | POA: Diagnosis not present

## 2021-12-10 DIAGNOSIS — G9529 Other cord compression: Secondary | ICD-10-CM | POA: Diagnosis not present

## 2021-12-10 DIAGNOSIS — M4802 Spinal stenosis, cervical region: Secondary | ICD-10-CM | POA: Diagnosis not present

## 2021-12-10 DIAGNOSIS — E538 Deficiency of other specified B group vitamins: Secondary | ICD-10-CM | POA: Diagnosis not present

## 2021-12-10 DIAGNOSIS — G629 Polyneuropathy, unspecified: Secondary | ICD-10-CM | POA: Diagnosis not present

## 2021-12-10 DIAGNOSIS — Z9181 History of falling: Secondary | ICD-10-CM | POA: Diagnosis not present

## 2021-12-10 DIAGNOSIS — K760 Fatty (change of) liver, not elsewhere classified: Secondary | ICD-10-CM | POA: Diagnosis not present

## 2021-12-13 ENCOUNTER — Telehealth: Payer: Self-pay | Admitting: Family Medicine

## 2021-12-13 ENCOUNTER — Ambulatory Visit: Payer: Medicare Other | Admitting: Family Medicine

## 2021-12-13 DIAGNOSIS — M5136 Other intervertebral disc degeneration, lumbar region: Secondary | ICD-10-CM | POA: Diagnosis not present

## 2021-12-13 DIAGNOSIS — E559 Vitamin D deficiency, unspecified: Secondary | ICD-10-CM | POA: Diagnosis not present

## 2021-12-13 DIAGNOSIS — K219 Gastro-esophageal reflux disease without esophagitis: Secondary | ICD-10-CM | POA: Diagnosis not present

## 2021-12-13 DIAGNOSIS — G43909 Migraine, unspecified, not intractable, without status migrainosus: Secondary | ICD-10-CM | POA: Diagnosis not present

## 2021-12-13 DIAGNOSIS — E538 Deficiency of other specified B group vitamins: Secondary | ICD-10-CM | POA: Diagnosis not present

## 2021-12-13 DIAGNOSIS — Z9181 History of falling: Secondary | ICD-10-CM | POA: Diagnosis not present

## 2021-12-13 DIAGNOSIS — E785 Hyperlipidemia, unspecified: Secondary | ICD-10-CM | POA: Diagnosis not present

## 2021-12-13 DIAGNOSIS — L989 Disorder of the skin and subcutaneous tissue, unspecified: Secondary | ICD-10-CM | POA: Diagnosis not present

## 2021-12-13 DIAGNOSIS — G9529 Other cord compression: Secondary | ICD-10-CM | POA: Diagnosis not present

## 2021-12-13 DIAGNOSIS — M797 Fibromyalgia: Secondary | ICD-10-CM | POA: Diagnosis not present

## 2021-12-13 DIAGNOSIS — F32A Depression, unspecified: Secondary | ICD-10-CM | POA: Diagnosis not present

## 2021-12-13 DIAGNOSIS — Z87440 Personal history of urinary (tract) infections: Secondary | ICD-10-CM | POA: Diagnosis not present

## 2021-12-13 DIAGNOSIS — G629 Polyneuropathy, unspecified: Secondary | ICD-10-CM | POA: Diagnosis not present

## 2021-12-13 DIAGNOSIS — I872 Venous insufficiency (chronic) (peripheral): Secondary | ICD-10-CM | POA: Diagnosis not present

## 2021-12-13 DIAGNOSIS — M4802 Spinal stenosis, cervical region: Secondary | ICD-10-CM | POA: Diagnosis not present

## 2021-12-13 DIAGNOSIS — I10 Essential (primary) hypertension: Secondary | ICD-10-CM | POA: Diagnosis not present

## 2021-12-13 DIAGNOSIS — G9589 Other specified diseases of spinal cord: Secondary | ICD-10-CM | POA: Diagnosis not present

## 2021-12-13 DIAGNOSIS — M300 Polyarteritis nodosa: Secondary | ICD-10-CM | POA: Diagnosis not present

## 2021-12-13 DIAGNOSIS — G4733 Obstructive sleep apnea (adult) (pediatric): Secondary | ICD-10-CM | POA: Diagnosis not present

## 2021-12-13 DIAGNOSIS — Z993 Dependence on wheelchair: Secondary | ICD-10-CM | POA: Diagnosis not present

## 2021-12-13 DIAGNOSIS — K259 Gastric ulcer, unspecified as acute or chronic, without hemorrhage or perforation: Secondary | ICD-10-CM | POA: Diagnosis not present

## 2021-12-13 DIAGNOSIS — K760 Fatty (change of) liver, not elsewhere classified: Secondary | ICD-10-CM | POA: Diagnosis not present

## 2021-12-13 NOTE — Telephone Encounter (Signed)
Spoke w/ Ceceila from [place] and gave the verbal order for pt to have PT 1 x wk x 8 wk

## 2021-12-13 NOTE — Telephone Encounter (Signed)
Ok for verbal orders ?

## 2021-12-13 NOTE — Telephone Encounter (Signed)
Caller name: Gaspar Garbe (from [facility])  On DPR? :yes/no: No  Call back number: [phone]  Provider they see: Dr. Birdie Riddle  Reason for call: Calling to request verbal orders for physical therapy. 1 x wk for 8 wks.

## 2021-12-14 ENCOUNTER — Telehealth: Payer: Self-pay | Admitting: Family Medicine

## 2021-12-14 ENCOUNTER — Telehealth: Payer: Self-pay

## 2021-12-14 DIAGNOSIS — K219 Gastro-esophageal reflux disease without esophagitis: Secondary | ICD-10-CM | POA: Diagnosis not present

## 2021-12-14 DIAGNOSIS — G9529 Other cord compression: Secondary | ICD-10-CM | POA: Diagnosis not present

## 2021-12-14 DIAGNOSIS — M5136 Other intervertebral disc degeneration, lumbar region: Secondary | ICD-10-CM | POA: Diagnosis not present

## 2021-12-14 DIAGNOSIS — F32A Depression, unspecified: Secondary | ICD-10-CM | POA: Diagnosis not present

## 2021-12-14 DIAGNOSIS — M797 Fibromyalgia: Secondary | ICD-10-CM | POA: Diagnosis not present

## 2021-12-14 DIAGNOSIS — G9589 Other specified diseases of spinal cord: Secondary | ICD-10-CM | POA: Diagnosis not present

## 2021-12-14 DIAGNOSIS — K760 Fatty (change of) liver, not elsewhere classified: Secondary | ICD-10-CM | POA: Diagnosis not present

## 2021-12-14 DIAGNOSIS — I10 Essential (primary) hypertension: Secondary | ICD-10-CM | POA: Diagnosis not present

## 2021-12-14 DIAGNOSIS — L989 Disorder of the skin and subcutaneous tissue, unspecified: Secondary | ICD-10-CM | POA: Diagnosis not present

## 2021-12-14 DIAGNOSIS — Z9181 History of falling: Secondary | ICD-10-CM | POA: Diagnosis not present

## 2021-12-14 DIAGNOSIS — E538 Deficiency of other specified B group vitamins: Secondary | ICD-10-CM | POA: Diagnosis not present

## 2021-12-14 DIAGNOSIS — M4802 Spinal stenosis, cervical region: Secondary | ICD-10-CM | POA: Diagnosis not present

## 2021-12-14 DIAGNOSIS — G43909 Migraine, unspecified, not intractable, without status migrainosus: Secondary | ICD-10-CM | POA: Diagnosis not present

## 2021-12-14 DIAGNOSIS — K259 Gastric ulcer, unspecified as acute or chronic, without hemorrhage or perforation: Secondary | ICD-10-CM | POA: Diagnosis not present

## 2021-12-14 DIAGNOSIS — E785 Hyperlipidemia, unspecified: Secondary | ICD-10-CM | POA: Diagnosis not present

## 2021-12-14 DIAGNOSIS — Z87440 Personal history of urinary (tract) infections: Secondary | ICD-10-CM | POA: Diagnosis not present

## 2021-12-14 DIAGNOSIS — E559 Vitamin D deficiency, unspecified: Secondary | ICD-10-CM | POA: Diagnosis not present

## 2021-12-14 DIAGNOSIS — G4733 Obstructive sleep apnea (adult) (pediatric): Secondary | ICD-10-CM | POA: Diagnosis not present

## 2021-12-14 DIAGNOSIS — Z993 Dependence on wheelchair: Secondary | ICD-10-CM | POA: Diagnosis not present

## 2021-12-14 DIAGNOSIS — G629 Polyneuropathy, unspecified: Secondary | ICD-10-CM | POA: Diagnosis not present

## 2021-12-14 DIAGNOSIS — M300 Polyarteritis nodosa: Secondary | ICD-10-CM | POA: Diagnosis not present

## 2021-12-14 DIAGNOSIS — I872 Venous insufficiency (chronic) (peripheral): Secondary | ICD-10-CM | POA: Diagnosis not present

## 2021-12-14 NOTE — Telephone Encounter (Signed)
Caller name: Fadia Marlar   On DPR? :yes/no: Yes  Call back number: [phone]  Provider they see: Birdie Riddle   Reason for call: Pt want to talk to [facility] about her transportation that she needs to get back and forth to her doctor appts.

## 2021-12-15 ENCOUNTER — Ambulatory Visit: Payer: Medicare Other | Admitting: Family Medicine

## 2021-12-15 DIAGNOSIS — G4733 Obstructive sleep apnea (adult) (pediatric): Secondary | ICD-10-CM | POA: Diagnosis not present

## 2021-12-15 DIAGNOSIS — K219 Gastro-esophageal reflux disease without esophagitis: Secondary | ICD-10-CM | POA: Diagnosis not present

## 2021-12-15 DIAGNOSIS — E785 Hyperlipidemia, unspecified: Secondary | ICD-10-CM | POA: Diagnosis not present

## 2021-12-15 DIAGNOSIS — M4802 Spinal stenosis, cervical region: Secondary | ICD-10-CM | POA: Diagnosis not present

## 2021-12-15 DIAGNOSIS — G629 Polyneuropathy, unspecified: Secondary | ICD-10-CM | POA: Diagnosis not present

## 2021-12-15 DIAGNOSIS — K259 Gastric ulcer, unspecified as acute or chronic, without hemorrhage or perforation: Secondary | ICD-10-CM | POA: Diagnosis not present

## 2021-12-15 DIAGNOSIS — Z87440 Personal history of urinary (tract) infections: Secondary | ICD-10-CM | POA: Diagnosis not present

## 2021-12-15 DIAGNOSIS — M797 Fibromyalgia: Secondary | ICD-10-CM | POA: Diagnosis not present

## 2021-12-15 DIAGNOSIS — K760 Fatty (change of) liver, not elsewhere classified: Secondary | ICD-10-CM | POA: Diagnosis not present

## 2021-12-15 DIAGNOSIS — I10 Essential (primary) hypertension: Secondary | ICD-10-CM | POA: Diagnosis not present

## 2021-12-15 DIAGNOSIS — E559 Vitamin D deficiency, unspecified: Secondary | ICD-10-CM | POA: Diagnosis not present

## 2021-12-15 DIAGNOSIS — L989 Disorder of the skin and subcutaneous tissue, unspecified: Secondary | ICD-10-CM | POA: Diagnosis not present

## 2021-12-15 DIAGNOSIS — M300 Polyarteritis nodosa: Secondary | ICD-10-CM | POA: Diagnosis not present

## 2021-12-15 DIAGNOSIS — I872 Venous insufficiency (chronic) (peripheral): Secondary | ICD-10-CM | POA: Diagnosis not present

## 2021-12-15 DIAGNOSIS — F32A Depression, unspecified: Secondary | ICD-10-CM | POA: Diagnosis not present

## 2021-12-15 DIAGNOSIS — Z993 Dependence on wheelchair: Secondary | ICD-10-CM | POA: Diagnosis not present

## 2021-12-15 DIAGNOSIS — G9589 Other specified diseases of spinal cord: Secondary | ICD-10-CM | POA: Diagnosis not present

## 2021-12-15 DIAGNOSIS — E538 Deficiency of other specified B group vitamins: Secondary | ICD-10-CM | POA: Diagnosis not present

## 2021-12-15 DIAGNOSIS — G9529 Other cord compression: Secondary | ICD-10-CM | POA: Diagnosis not present

## 2021-12-15 DIAGNOSIS — G43909 Migraine, unspecified, not intractable, without status migrainosus: Secondary | ICD-10-CM | POA: Diagnosis not present

## 2021-12-15 DIAGNOSIS — M5136 Other intervertebral disc degeneration, lumbar region: Secondary | ICD-10-CM | POA: Diagnosis not present

## 2021-12-15 DIAGNOSIS — Z9181 History of falling: Secondary | ICD-10-CM | POA: Diagnosis not present

## 2021-12-16 ENCOUNTER — Telehealth: Payer: Self-pay | Admitting: Family Medicine

## 2021-12-16 DIAGNOSIS — K259 Gastric ulcer, unspecified as acute or chronic, without hemorrhage or perforation: Secondary | ICD-10-CM | POA: Diagnosis not present

## 2021-12-16 DIAGNOSIS — M5136 Other intervertebral disc degeneration, lumbar region: Secondary | ICD-10-CM | POA: Diagnosis not present

## 2021-12-16 DIAGNOSIS — L989 Disorder of the skin and subcutaneous tissue, unspecified: Secondary | ICD-10-CM | POA: Diagnosis not present

## 2021-12-16 DIAGNOSIS — G9529 Other cord compression: Secondary | ICD-10-CM | POA: Diagnosis not present

## 2021-12-16 DIAGNOSIS — F32A Depression, unspecified: Secondary | ICD-10-CM | POA: Diagnosis not present

## 2021-12-16 DIAGNOSIS — G4733 Obstructive sleep apnea (adult) (pediatric): Secondary | ICD-10-CM | POA: Diagnosis not present

## 2021-12-16 DIAGNOSIS — E785 Hyperlipidemia, unspecified: Secondary | ICD-10-CM | POA: Diagnosis not present

## 2021-12-16 DIAGNOSIS — K760 Fatty (change of) liver, not elsewhere classified: Secondary | ICD-10-CM | POA: Diagnosis not present

## 2021-12-16 DIAGNOSIS — Z993 Dependence on wheelchair: Secondary | ICD-10-CM | POA: Diagnosis not present

## 2021-12-16 DIAGNOSIS — G43909 Migraine, unspecified, not intractable, without status migrainosus: Secondary | ICD-10-CM | POA: Diagnosis not present

## 2021-12-16 DIAGNOSIS — M4802 Spinal stenosis, cervical region: Secondary | ICD-10-CM | POA: Diagnosis not present

## 2021-12-16 DIAGNOSIS — I872 Venous insufficiency (chronic) (peripheral): Secondary | ICD-10-CM | POA: Diagnosis not present

## 2021-12-16 DIAGNOSIS — G9589 Other specified diseases of spinal cord: Secondary | ICD-10-CM | POA: Diagnosis not present

## 2021-12-16 DIAGNOSIS — I10 Essential (primary) hypertension: Secondary | ICD-10-CM | POA: Diagnosis not present

## 2021-12-16 DIAGNOSIS — G629 Polyneuropathy, unspecified: Secondary | ICD-10-CM | POA: Diagnosis not present

## 2021-12-16 DIAGNOSIS — E538 Deficiency of other specified B group vitamins: Secondary | ICD-10-CM | POA: Diagnosis not present

## 2021-12-16 DIAGNOSIS — E559 Vitamin D deficiency, unspecified: Secondary | ICD-10-CM | POA: Diagnosis not present

## 2021-12-16 DIAGNOSIS — K219 Gastro-esophageal reflux disease without esophagitis: Secondary | ICD-10-CM | POA: Diagnosis not present

## 2021-12-16 DIAGNOSIS — Z87440 Personal history of urinary (tract) infections: Secondary | ICD-10-CM | POA: Diagnosis not present

## 2021-12-16 DIAGNOSIS — Z9181 History of falling: Secondary | ICD-10-CM | POA: Diagnosis not present

## 2021-12-16 DIAGNOSIS — M300 Polyarteritis nodosa: Secondary | ICD-10-CM | POA: Diagnosis not present

## 2021-12-16 DIAGNOSIS — M797 Fibromyalgia: Secondary | ICD-10-CM | POA: Diagnosis not present

## 2021-12-16 NOTE — Telephone Encounter (Signed)
Caller name: [name] (nurse with [name])  On DPR? :yes/no: Yes  Call back number: [phone]  Provider they see: Dr. Birdie Riddle  Reason for call: Developed yeast in rt groin area, is leaking clear fluid. Also wants to know if a purewick can be ordered and the catheter for the purewick as well. States there is a new wound to rt outer calf.

## 2021-12-16 NOTE — Telephone Encounter (Signed)
Spoke w/ [name] and she states they do have a wound protocol in place . They are using the Uni boots on her. [name] spoke to [place] son yesterday and advised him that she had to be changed more frequently . She states she placed a ADD pads on the open spot .[name] states it is open under the right abdominal fold with clear drainage no odor .[name] is not sure if the powder or the cream would help if it is open . She states it is a urine smell to the pt .

## 2021-12-16 NOTE — Telephone Encounter (Signed)
It is my understanding that Purewick is not covered by insurance.  We can order it but I don't want her to be billed for a high priced item that she maybe can't afford.  Unless it is covered through home health, they need to check with insurance.  Do they have a wound protocol in place for the area on her lower calf?  If so, they can proceed.  As for the yeast, does she think cream or powder would be more effective in the area?

## 2021-12-17 ENCOUNTER — Telehealth: Payer: Self-pay

## 2021-12-17 NOTE — Telephone Encounter (Signed)
Spoke w/ pt and I advised we had the form it is in Dr Birdie Riddle to be signed folder . I explained to her that we had 7 to 10 business days to fill it out . I explained to pt that once it is signed I will fax it back . She expressed verbal understanding

## 2021-12-17 NOTE — Telephone Encounter (Signed)
Patient is calling to check on her forms and see have they been completed and faxed back. Patient states they were at our office since last Monday. She would like a call

## 2021-12-20 ENCOUNTER — Telehealth: Payer: Self-pay | Admitting: Family Medicine

## 2021-12-20 ENCOUNTER — Other Ambulatory Visit: Payer: Self-pay | Admitting: Family Medicine

## 2021-12-20 DIAGNOSIS — E785 Hyperlipidemia, unspecified: Secondary | ICD-10-CM | POA: Diagnosis not present

## 2021-12-20 DIAGNOSIS — G43909 Migraine, unspecified, not intractable, without status migrainosus: Secondary | ICD-10-CM | POA: Diagnosis not present

## 2021-12-20 DIAGNOSIS — F419 Anxiety disorder, unspecified: Secondary | ICD-10-CM

## 2021-12-20 DIAGNOSIS — M797 Fibromyalgia: Secondary | ICD-10-CM | POA: Diagnosis not present

## 2021-12-20 DIAGNOSIS — K219 Gastro-esophageal reflux disease without esophagitis: Secondary | ICD-10-CM | POA: Diagnosis not present

## 2021-12-20 DIAGNOSIS — E538 Deficiency of other specified B group vitamins: Secondary | ICD-10-CM | POA: Diagnosis not present

## 2021-12-20 DIAGNOSIS — Z993 Dependence on wheelchair: Secondary | ICD-10-CM | POA: Diagnosis not present

## 2021-12-20 DIAGNOSIS — I872 Venous insufficiency (chronic) (peripheral): Secondary | ICD-10-CM | POA: Diagnosis not present

## 2021-12-20 DIAGNOSIS — I10 Essential (primary) hypertension: Secondary | ICD-10-CM | POA: Diagnosis not present

## 2021-12-20 DIAGNOSIS — K760 Fatty (change of) liver, not elsewhere classified: Secondary | ICD-10-CM | POA: Diagnosis not present

## 2021-12-20 DIAGNOSIS — F32A Depression, unspecified: Secondary | ICD-10-CM | POA: Diagnosis not present

## 2021-12-20 DIAGNOSIS — E559 Vitamin D deficiency, unspecified: Secondary | ICD-10-CM | POA: Diagnosis not present

## 2021-12-20 DIAGNOSIS — M4802 Spinal stenosis, cervical region: Secondary | ICD-10-CM | POA: Diagnosis not present

## 2021-12-20 DIAGNOSIS — G9529 Other cord compression: Secondary | ICD-10-CM | POA: Diagnosis not present

## 2021-12-20 DIAGNOSIS — Z87440 Personal history of urinary (tract) infections: Secondary | ICD-10-CM | POA: Diagnosis not present

## 2021-12-20 DIAGNOSIS — G4733 Obstructive sleep apnea (adult) (pediatric): Secondary | ICD-10-CM | POA: Diagnosis not present

## 2021-12-20 DIAGNOSIS — G629 Polyneuropathy, unspecified: Secondary | ICD-10-CM | POA: Diagnosis not present

## 2021-12-20 DIAGNOSIS — M5136 Other intervertebral disc degeneration, lumbar region: Secondary | ICD-10-CM | POA: Diagnosis not present

## 2021-12-20 DIAGNOSIS — Z9181 History of falling: Secondary | ICD-10-CM | POA: Diagnosis not present

## 2021-12-20 DIAGNOSIS — M300 Polyarteritis nodosa: Secondary | ICD-10-CM | POA: Diagnosis not present

## 2021-12-20 DIAGNOSIS — K259 Gastric ulcer, unspecified as acute or chronic, without hemorrhage or perforation: Secondary | ICD-10-CM | POA: Diagnosis not present

## 2021-12-20 DIAGNOSIS — G9589 Other specified diseases of spinal cord: Secondary | ICD-10-CM | POA: Diagnosis not present

## 2021-12-20 DIAGNOSIS — L989 Disorder of the skin and subcutaneous tissue, unspecified: Secondary | ICD-10-CM | POA: Diagnosis not present

## 2021-12-20 MED ORDER — NYSTATIN 100000 UNIT/GM EX POWD: CUTANEOUS | 3 refills | Status: DC

## 2021-12-20 NOTE — Telephone Encounter (Signed)
Caller name: Arboriculturist with [place] [facility])  On DPR? :yes/no: No  Call back number: [phone]  Provider they see: Dr. Birdie Riddle  Reason for call: Advised [name] that Nystatin was sent in this morning by Dr. Birdie Riddle.

## 2021-12-20 NOTE — Telephone Encounter (Signed)
Prescription for Nystatin powder sent to pharmacy

## 2021-12-20 NOTE — Telephone Encounter (Signed)
Form completed and placed in basket  

## 2021-12-20 NOTE — Telephone Encounter (Signed)
Forms have been faxed back for pt

## 2021-12-20 NOTE — Telephone Encounter (Signed)
Forms faxed and sent to [facility] because it did have a charge sheet . Forms are in scan file now

## 2021-12-21 DIAGNOSIS — G9589 Other specified diseases of spinal cord: Secondary | ICD-10-CM | POA: Diagnosis not present

## 2021-12-21 DIAGNOSIS — M4802 Spinal stenosis, cervical region: Secondary | ICD-10-CM | POA: Diagnosis not present

## 2021-12-21 DIAGNOSIS — M5136 Other intervertebral disc degeneration, lumbar region: Secondary | ICD-10-CM | POA: Diagnosis not present

## 2021-12-21 DIAGNOSIS — E538 Deficiency of other specified B group vitamins: Secondary | ICD-10-CM | POA: Diagnosis not present

## 2021-12-21 DIAGNOSIS — I10 Essential (primary) hypertension: Secondary | ICD-10-CM | POA: Diagnosis not present

## 2021-12-21 DIAGNOSIS — L989 Disorder of the skin and subcutaneous tissue, unspecified: Secondary | ICD-10-CM | POA: Diagnosis not present

## 2021-12-21 DIAGNOSIS — G9529 Other cord compression: Secondary | ICD-10-CM | POA: Diagnosis not present

## 2021-12-21 DIAGNOSIS — G629 Polyneuropathy, unspecified: Secondary | ICD-10-CM | POA: Diagnosis not present

## 2021-12-21 DIAGNOSIS — K259 Gastric ulcer, unspecified as acute or chronic, without hemorrhage or perforation: Secondary | ICD-10-CM | POA: Diagnosis not present

## 2021-12-21 DIAGNOSIS — E785 Hyperlipidemia, unspecified: Secondary | ICD-10-CM | POA: Diagnosis not present

## 2021-12-21 DIAGNOSIS — K760 Fatty (change of) liver, not elsewhere classified: Secondary | ICD-10-CM | POA: Diagnosis not present

## 2021-12-21 DIAGNOSIS — F32A Depression, unspecified: Secondary | ICD-10-CM | POA: Diagnosis not present

## 2021-12-21 DIAGNOSIS — E559 Vitamin D deficiency, unspecified: Secondary | ICD-10-CM | POA: Diagnosis not present

## 2021-12-21 DIAGNOSIS — M300 Polyarteritis nodosa: Secondary | ICD-10-CM | POA: Diagnosis not present

## 2021-12-21 DIAGNOSIS — M797 Fibromyalgia: Secondary | ICD-10-CM | POA: Diagnosis not present

## 2021-12-21 DIAGNOSIS — G43909 Migraine, unspecified, not intractable, without status migrainosus: Secondary | ICD-10-CM | POA: Diagnosis not present

## 2021-12-21 DIAGNOSIS — Z9181 History of falling: Secondary | ICD-10-CM | POA: Diagnosis not present

## 2021-12-21 DIAGNOSIS — G4733 Obstructive sleep apnea (adult) (pediatric): Secondary | ICD-10-CM | POA: Diagnosis not present

## 2021-12-21 DIAGNOSIS — Z993 Dependence on wheelchair: Secondary | ICD-10-CM | POA: Diagnosis not present

## 2021-12-21 DIAGNOSIS — Z87440 Personal history of urinary (tract) infections: Secondary | ICD-10-CM | POA: Diagnosis not present

## 2021-12-21 DIAGNOSIS — K219 Gastro-esophageal reflux disease without esophagitis: Secondary | ICD-10-CM | POA: Diagnosis not present

## 2021-12-21 DIAGNOSIS — I872 Venous insufficiency (chronic) (peripheral): Secondary | ICD-10-CM | POA: Diagnosis not present

## 2021-12-22 ENCOUNTER — Ambulatory Visit (INDEPENDENT_AMBULATORY_CARE_PROVIDER_SITE_OTHER): Payer: Medicare Other

## 2021-12-22 DIAGNOSIS — Z0000 Encounter for general adult medical examination without abnormal findings: Secondary | ICD-10-CM | POA: Diagnosis not present

## 2021-12-22 NOTE — Patient Instructions (Signed)
[name] , Thank you for taking time to come for your Medicare Wellness Visit. I appreciate your ongoing commitment to your health goals. Please review the following plan we discussed and let me know if I can assist you in the future.   Screening recommendations/referrals: Colonoscopy: patient request delay due to health issues  Mammogram: Patient request dely due to health issues  Bone Density: not of age  Recommended yearly ophthalmology/optometry visit for glaucoma screening and checkup Recommended yearly dental visit for hygiene and checkup  Vaccinations: Influenza vaccine: completed  Pneumococcal vaccine: completed  Tdap vaccine: 07/24/2018 Shingles vaccine: will consider discuss with PCP     Advanced directives: none   Conditions/risks identified: none   Next appointment: none   Preventive Care 40-64 Years, Female Preventive care refers to lifestyle choices and visits with your health care provider that can promote health and wellness. What does preventive care include? A yearly physical exam. This is also called an annual well check. Dental exams once or twice a year. Routine eye exams. Ask your health care provider how often you should have your eyes checked. Personal lifestyle choices, including: Daily care of your teeth and gums. Regular physical activity. Eating a healthy diet. Avoiding tobacco and drug use. Limiting alcohol use. Practicing safe sex. Taking low-dose aspirin daily starting at age 32. Taking vitamin and mineral supplements as recommended by your health care provider. What happens during an annual well check? The services and screenings done by your health care provider during your annual well check will depend on your age, overall health, lifestyle risk factors, and family history of disease. Counseling  Your health care provider may ask you questions about your: Alcohol use. Tobacco use. Drug use. Emotional well-being. Home and relationship  well-being. Sexual activity. Eating habits. Work and work Statistician. Method of birth control. Menstrual cycle. Pregnancy history. Screening  You may have the following tests or measurements: Height, weight, and BMI. Blood pressure. Lipid and cholesterol levels. These may be checked every 5 years, or more frequently if you are over 32 years old. Skin check. Lung cancer screening. You may have this screening every year starting at age 31 if you have a 30-pack-year history of smoking and currently smoke or have quit within the past 15 years. Fecal occult blood test (FOBT) of the stool. You may have this test every year starting at age 48. Flexible sigmoidoscopy or colonoscopy. You may have a sigmoidoscopy every 5 years or a colonoscopy every 10 years starting at age 34. Hepatitis C blood test. Hepatitis B blood test. Sexually transmitted disease (STD) testing. Diabetes screening. This is done by checking your blood sugar (glucose) after you have not eaten for a while (fasting). You may have this done every 1-3 years. Mammogram. This may be done every 1-2 years. Talk to your health care provider about when you should start having regular mammograms. This may depend on whether you have a family history of breast cancer. BRCA-related cancer screening. This may be done if you have a family history of breast, ovarian, tubal, or peritoneal cancers. Pelvic exam and Pap test. This may be done every 3 years starting at age 34. Starting at age 53, this may be done every 5 years if you have a Pap test in combination with an HPV test. Bone density scan. This is done to screen for osteoporosis. You may have this scan if you are at high risk for osteoporosis. Discuss your test results, treatment options, and if necessary, the need  for more tests with your health care provider. Vaccines  Your health care provider may recommend certain vaccines, such as: Influenza vaccine. This is recommended every  year. Tetanus, diphtheria, and acellular pertussis (Tdap, Td) vaccine. You may need a Td booster every 10 years. Zoster vaccine. You may need this after age 13. Pneumococcal 13-valent conjugate (PCV13) vaccine. You may need this if you have certain conditions and were not previously vaccinated. Pneumococcal polysaccharide (PPSV23) vaccine. You may need one or two doses if you smoke cigarettes or if you have certain conditions. Talk to your health care provider about which screenings and vaccines you need and how often you need them. This information is not intended to replace advice given to you by your health care provider. Make sure you discuss any questions you have with your health care provider. Document Released: 05/29/2015 Document Revised: 01/20/2016 Document Reviewed: 03/03/2015 Elsevier Interactive Patient Education  2017 [place] Prevention in the Home Falls can cause injuries. They can happen to people of all ages. There are many things you can do to make your home safe and to help prevent falls. What can I do on the outside of my home? Regularly fix the edges of walkways and driveways and fix any cracks. Remove anything that might make you trip as you walk through a door, such as a raised step or threshold. Trim any bushes or trees on the path to your home. Use bright outdoor lighting. Clear any walking paths of anything that might make someone trip, such as rocks or tools. Regularly check to see if handrails are loose or broken. Make sure that both sides of any steps have handrails. Any raised decks and porches should have guardrails on the edges. Have any leaves, snow, or ice cleared regularly. Use sand or salt on walking paths during winter. Clean up any spills in your garage right away. This includes oil or grease spills. What can I do in the bathroom? Use night lights. Install grab bars by the toilet and in the tub and shower. Do not use towel bars as grab  bars. Use non-skid mats or decals in the tub or shower. If you need to sit down in the shower, use a plastic, non-slip stool. Keep the floor dry. Clean up any water that spills on the floor as soon as it happens. Remove soap buildup in the tub or shower regularly. Attach bath mats securely with double-sided non-slip rug tape. Do not have throw rugs and other things on the floor that can make you trip. What can I do in the bedroom? Use night lights. Make sure that you have a light by your bed that is easy to reach. Do not use any sheets or blankets that are too big for your bed. They should not hang down onto the floor. Have a firm chair that has side arms. You can use this for support while you get dressed. Do not have throw rugs and other things on the floor that can make you trip. What can I do in the kitchen? Clean up any spills right away. Avoid walking on wet floors. Keep items that you use a lot in easy-to-reach places. If you need to reach something above you, use a strong step stool that has a grab bar. Keep electrical cords out of the way. Do not use floor polish or wax that makes floors slippery. If you must use wax, use non-skid floor wax. Do not have throw rugs and other  things on the floor that can make you trip. What can I do with my stairs? Do not leave any items on the stairs. Make sure that there are handrails on both sides of the stairs and use them. Fix handrails that are broken or loose. Make sure that handrails are as long as the stairways. Check any carpeting to make sure that it is firmly attached to the stairs. Fix any carpet that is loose or worn. Avoid having throw rugs at the top or bottom of the stairs. If you do have throw rugs, attach them to the floor with carpet tape. Make sure that you have a light switch at the top of the stairs and the bottom of the stairs. If you do not have them, ask someone to add them for you. What else can I do to help prevent  falls? Wear shoes that: Do not have high heels. Have rubber bottoms. Are comfortable and fit you well. Are closed at the toe. Do not wear sandals. If you use a stepladder: Make sure that it is fully opened. Do not climb a closed stepladder. Make sure that both sides of the stepladder are locked into place. Ask someone to hold it for you, if possible. Clearly mark and make sure that you can see: Any grab bars or handrails. First and last steps. Where the edge of each step is. Use tools that help you move around (mobility aids) if they are needed. These include: Canes. Walkers. Scooters. Crutches. Turn on the lights when you go into a dark area. Replace any light bulbs as soon as they burn out. Set up your furniture so you have a clear path. Avoid moving your furniture around. If any of your floors are uneven, fix them. If there are any pets around you, be aware of where they are. Review your medicines with your doctor. Some medicines can make you feel dizzy. This can increase your chance of falling. Ask your doctor what other things that you can do to help prevent falls. This information is not intended to replace advice given to you by your health care provider. Make sure you discuss any questions you have with your health care provider. Document Released: 02/26/2009 Document Revised: 10/08/2015 Document Reviewed: 06/06/2014 Elsevier Interactive Patient Education  2017 [employer].

## 2021-12-22 NOTE — Progress Notes (Signed)
Subjective:   [name] is a 51 y.o. female who presents for Medicare Annual (Subsequent) preventive examination.   I connected with [name]  today by telephone and verified that I am speaking with the correct person using two identifiers. Location patient: home Location provider: work Persons participating in the virtual visit: patient, provider.   I discussed the limitations, risks, security and privacy concerns of performing an evaluation and management service by telephone and the availability of in person appointments. I also discussed with the patient that there may be a patient responsible charge related to this service. The patient expressed understanding and verbally consented to this telephonic visit.    Interactive audio and video telecommunications were attempted between this provider and patient, however failed, due to patient having technical difficulties OR patient did not have access to video capability.  We continued and completed visit with audio only.    Review of Systems     Cardiac Risk Factors include: advanced age (>75mn, >>6women)     Objective:    Today's Vitals   There is no height or weight on file to calculate BMI.     12/22/2021    1:39 PM 08/12/2021    5:06 PM 08/12/2021   11:33 AM 06/04/2021    6:05 PM 09/14/2020    1:33 PM 09/11/2019    2:17 PM 07/26/2019   12:31 PM  Advanced Directives  Does Patient Have a Medical Advance Directive? No No No No No No No  Does patient want to make changes to medical advance directive?      Yes (MAU/Ambulatory/Procedural Areas - Information given)   Would patient like information on creating a medical advance directive? No - Patient declined No - Patient declined  No - Patient declined Yes (MAU/Ambulatory/Procedural Areas - Information given)  Yes (ED - Information included in AVS)    Current Medications (verified) Outpatient Encounter Medications as of 12/22/2021  Medication Sig   ALPRAZolam (XANAX) 0.5 MG  tablet TAKE 1 TABLET BY MOUTH 2 TIMES DAILY   ARIPiprazole (ABILIFY) 15 MG tablet Take 1 tablet (15 mg total) by mouth daily.   Armodafinil 250 MG tablet Take 250 mg by mouth in the morning and at bedtime.   Biotin 10 MG TABS Take 10 mg by mouth in the morning.    buPROPion (WELLBUTRIN XL) 300 MG 24 hr tablet TAKE 1 TABLET BY MOUTH EVERY DAY   calcium citrate (CALCITRATE - DOSED IN MG ELEMENTAL CALCIUM) 950 MG tablet Take 1 tablet by mouth daily.   clonazePAM (KLONOPIN) 1 MG tablet TAKE 1 TABLET BY MOUTH AT BEDTIME   cyclobenzaprine (FLEXERIL) 10 MG tablet TAKE 2 TABLETS BY MOUTH 2 TIMES DAILY   diclofenac Sodium (VOLTAREN) 1 % GEL Apply 4 g topically 4 (four) times daily.   diphenhydrAMINE (BENADRYL) 25 MG tablet Take 25 mg by mouth 2 (two) times daily as needed for allergies.   DULoxetine (CYMBALTA) 60 MG capsule Take 2 capsules (120 mg total) by mouth daily. (Patient taking differently: Take 60 mg by mouth 2 (two) times daily.)   FEROSUL 325 (65 Fe) MG tablet TAKE 1 TABLET BY MOUTH EVERY DAY WITH BREAKFAST   folic acid (FOLVITE) 1 MG tablet TAKE 1 TABLET BY MOUTH EVERY DAY   gabapentin (NEURONTIN) 600 MG tablet Take 1 tablet (600 mg total) by mouth 3 (three) times daily. TAKE 2 TABLETS BY MOUTH 3 TIMES DAILY (Patient taking differently: Take 600 mg by mouth 3 (three) times daily.)  HYDROcodone-acetaminophen (NORCO) 10-325 MG tablet TAKE 1 TABLET BY MOUTH EVERY 6 HOURS AS NEEDED   hydrOXYzine (VISTARIL) 25 MG capsule Take 1 capsule (25 mg total) by mouth every 8 (eight) hours as needed.   nebivolol (BYSTOLIC) 10 MG tablet TAKE 1 TABLET BY MOUTH EVERY DAY   nystatin (MYCOSTATIN/NYSTOP) powder APPLY 1 APPLICATION TOPICALLY 3 TIMES DAILY   Oxcarbazepine (TRILEPTAL) 300 MG tablet Take 300 mg by mouth 2 (two) times daily.   pantoprazole (PROTONIX) 40 MG tablet TAKE 1 TABLET BY MOUTH 2 TIMES DAILY   phentermine 37.5 MG capsule Take 1 capsule (37.5 mg total) by mouth every morning.   promethazine  (PHENERGAN) 25 MG tablet TAKE 1 TABLET BY MOUTH EVERY 6 HOURS AS NEEDED FOR NAUSEA AND VOMITING   pyridOXINE (VITAMIN B-6) 100 MG tablet TAKE 1 TABLET BY MOUTH EVERY DAY   rosuvastatin (CRESTOR) 20 MG tablet TAKE 1 TABLET BY MOUTH EVERY DAY   senna-docusate (SENOKOT-S) 8.6-50 MG tablet Take 1 tablet by mouth at bedtime. (Patient taking differently: Take 1 tablet by mouth daily as needed for mild constipation.)   SKYRIZI 150 MG/ML SOSY Inject 150 mg into the skin every 3 (three) months.   thiamine 100 MG tablet TAKE 1 TABLET BY MOUTH EVERY DAY   vitamin B-12 (CYANOCOBALAMIN) 1000 MCG tablet TAKE 1 TABLET BY MOUTH EVERY DAY   furosemide (LASIX) 40 MG tablet TAKE 1 TABLET BY MOUTH EVERY DAY MAY take additional TABLET IF weight INCREASE by 5lb AND call MD FOR further instructions (Patient taking differently: Take 40 mg by mouth daily. 1t qd-may take an additional tablet if weight increases by 5lbs.)   No facility-administered encounter medications on file as of 12/22/2021.    Allergies (verified) Niacin, Sulfamethoxazole-trimethoprim, Aspirin, Bactrim, Benzoin compound, Cephalexin, Clindamycin/lincomycin, Iohexol, Lisinopril, Naproxen, Pnu-imune [pneumococcal polysaccharide vaccine], and Sulfonamide derivatives   History: Past Medical History:  Diagnosis Date   Allergic rhinitis    Ankle fracture, right    Anxiety    ASCUS of cervix with negative high risk HPV 05/2018   Carpal tunnel syndrome    Cervical cancer ([place]) 1998   Fibromyalgia    Hyperlipidemia    Hypertension    Migraine headache    Obesity    Sleep apnea    Past Surgical History:  Procedure Laterality Date   ABDOMINAL HYSTERECTOMY  10/1996   ABDOMINAL SURGERY  jan/11/2012   gastric bypass surgery   CARPAL TUNNEL RELEASE     bilateral    CESAREAN SECTION     CHOLECYSTECTOMY  07/1992   ESOPHAGOGASTRODUODENOSCOPY N/A 01/14/2016   Procedure: ESOPHAGOGASTRODUODENOSCOPY (EGD);  Surgeon: Mauri Pole, MD;  Location: [facility]  ENDOSCOPY;  Service: Endoscopy;  Laterality: N/A;   Family History  Problem Relation Age of Onset   Diabetes Mother    Emphysema Mother    Allergies Mother    Hypertension Mother    Heart disease Father    Allergies Father    Prostate cancer Father    Breast cancer Maternal Grandmother 87   Social History   Socioeconomic History   Marital status: Single    Spouse name: Not on file   Number of children: Not on file   Years of education: Not on file   Highest education level: Not on file  Occupational History   Occupation: disabled. 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)  Tobacco Use   Smoking status: Never   Smokeless tobacco: Never  Vaping Use   Vaping Use: Never used  Substance and Sexual  Activity   Alcohol use: No   Drug use: No   Sexual activity: Yes    Comment: TAH-1st intercourse 69 yo-5 partners  Other Topics Concern   Not on file  Social History Narrative   Not on file   Social Determinants of Health   Financial Resource Strain: Low Risk  (12/22/2021)   Overall Financial Resource Strain (CARDIA)    Difficulty of Paying Living Expenses: Not hard at all  Food Insecurity: No Food Insecurity (12/22/2021)   Hunger Vital Sign    Worried About Running Out of Food in the Last Year: Never true    Ran Out of Food in the Last Year: Never true  Transportation Needs: No Transportation Needs (12/22/2021)   PRAPARE - Hydrologist (Medical): No    Lack of Transportation (Non-Medical): No  Physical Activity: Inactive (09/14/2020)   Exercise Vital Sign    Days of Exercise per Week: 0 days    Minutes of Exercise per Session: 0 min  Stress: No Stress Concern Present (12/22/2021)   [place]    Feeling of Stress : Not at all  Social Connections: Socially Isolated (12/22/2021)   Social Connection and Isolation Panel [NHANES]    Frequency of Communication with Friends and Family: Twice a week     Frequency of Social Gatherings with Friends and Family: Twice a week    Attends Religious Services: Never    Marine scientist or Organizations: No    Attends Music therapist: Never    Marital Status: Divorced    Tobacco Counseling Counseling given: Not Answered   Clinical Intake:  Pre-visit preparation completed: Yes  Pain : No/denies pain     Nutritional Risks: None Diabetes: No  How often do you need to have someone help you when you read instructions, pamphlets, or other written materials from your doctor or pharmacy?: 1 - Never What is the last grade level you completed in school?: college  Diabetic?no   Interpreter Needed?: No  Information entered by :: L.Wilson,LPN   Activities of Daily Living    12/22/2021    1:43 PM 08/12/2021    5:06 PM  In your present state of health, do you have any difficulty performing the following activities:  Hearing? 0 0  Vision? 0 0  Difficulty concentrating or making decisions? 0 0  Walking or climbing stairs? 1 1  Dressing or bathing? 1 1  Doing errands, shopping? 1 1  Comment wheelchair   Preparing Food and eating ? Y   Using the Toilet? Y   In the past six months, have you accidently leaked urine? Y   Do you have problems with loss of bowel control? Y   Managing your Medications? Y   Managing your Finances? Y   Housekeeping or managing your Housekeeping? Y     Patient Care Team: [name], MD as PCP - General Eino Farber, PA-C as Consulting Physician (Physician Assistant) Blima Dessert, MD as Referring Physician (Rheumatology) Loa Socks, MD as Referring Physician (Dermatology) Terrance Mass, MD (Inactive) (Gynecology) Fontaine, Belinda Block, MD (Inactive) as Consulting Physician (Gynecology) Madelin Rear, [facility] as Pharmacist (Pharmacist)  Indicate any recent Medical Services you may have received from other than [facility] providers in the past year (date may be approximate).      Assessment:   This is a routine wellness examination for Sharhonda.  Hearing/Vision screen Vision Screening - Comments:: Declined  Dietary issues and exercise activities discussed: Current Exercise Habits: The patient does not participate in regular exercise at present, Exercise limited by: orthopedic condition(s)   Goals Addressed   None    Depression Screen    12/22/2021    1:40 PM 12/22/2021    1:38 PM 10/21/2021   12:28 PM 09/14/2021   10:52 AM 05/07/2021    9:20 AM 02/10/2021    2:45 PM 10/05/2020   11:06 AM  PHQ 2/9 Scores  PHQ - 2 Score 0 0 '6 6 2 3 '$ 0  PHQ- 9 Score   '6 13 5 6 '$ 0    Fall Risk    12/22/2021    1:39 PM 10/21/2021   12:28 PM 09/14/2021   10:51 AM 05/07/2021    9:22 AM 02/10/2021    2:45 PM  Fall Risk   Falls in the past year? '1 1 1 1 1  '$ Number falls in past yr: 0 '1 1 1 1  '$ Injury with Fall? 1 1 0 0 0  Risk for fall due to : History of fall(s);Impaired mobility History of fall(s) History of fall(s) History of fall(s) History of fall(s);Impaired balance/gait  Risk for fall due to: Comment wheel chair      Follow up Falls evaluation completed;Education provided Falls evaluation completed Falls evaluation completed Falls evaluation completed Falls evaluation completed    Leslie:  Any stairs in or around the home? Yes  If so, are there any without handrails? No  Home free of loose throw rugs in walkways, pet beds, electrical cords, etc? Yes  Adequate lighting in your home to reduce risk of falls? Yes   ASSISTIVE DEVICES UTILIZED TO PREVENT FALLS:  Life alert? No  Use of a cane, walker or w/c? No  Grab bars in the bathroom? No  Shower chair or bench in shower? No  Elevated toilet seat or a handicapped toilet? No    Cognitive Function:    Normal cognitive status assessed by telephone conversation by this Nurse Health Advisor. No abnormalities found.        12/22/2021    1:44 PM 09/11/2019    2:19 PM  6CIT Screen  What Year?  0 points 0 points  What month? 0 points 0 points  What time? 0 points 0 points  Count back from 20 0 points 0 points  Months in reverse 0 points 0 points  Repeat phrase 0 points 0 points  Total Score 0 points 0 points    Immunizations Immunization History  Administered Date(s) Administered   Influenza Split 03/17/2011   Influenza Whole 02/15/2010   Influenza,inj,Quad PF,6+ Mos 05/17/2015, 02/23/2016, 02/13/2017, 01/18/2019, 02/10/2021   Influenza-Unspecified 02/27/2013, 01/11/2018, 03/10/2020   PFIZER(Purple Top)SARS-COV-2 Vaccination 08/15/2019, 09/09/2019, 01/31/2020   Pfizer Covid-19 Vaccine Bivalent Booster 97yr & up 03/11/2021   Pneumococcal Conjugate-13 10/04/2017   Pneumococcal Polysaccharide-23 03/17/2011, 01/10/2018   Td 05/16/2002   Tdap 07/24/2018    TDAP status: Up to date  Flu Vaccine status: Up to date  Pneumococcal vaccine status: Up to date  Covid-19 vaccine status: Completed vaccines  Qualifies for Shingles Vaccine? Yes   Zostavax completed No   Shingrix Completed?: No.    Education has been provided regarding the importance of this vaccine. Patient has been advised to call insurance company to determine out of pocket expense if they have not yet received this vaccine. Advised may also receive vaccine at local pharmacy or Health Dept. Verbalized acceptance and understanding.  Screening  Tests Health Maintenance  Topic Date Due   COLONOSCOPY (Pts 45-64yr Insurance coverage will need to be confirmed)  Never done   MAMMOGRAM  01/08/2021   INFLUENZA VACCINE  12/14/2021   TETANUS/TDAP  07/23/2028   COVID-19 Vaccine  Completed   Hepatitis C Screening  Completed   HIV Screening  Completed   HPV VACCINES  Aged Out   Zoster Vaccines- Shingrix  Discontinued    Health Maintenance  Health Maintenance Due  Topic Date Due   COLONOSCOPY (Pts 45-417yrInsurance coverage will need to be confirmed)  Never done   MAMMOGRAM  01/08/2021   INFLUENZA VACCINE   12/14/2021    Colorectal cancer screening: Referral to GI placed patient request to delay  due to health issue . Pt aware the office will call re: appt.  Mammogram status: Ordered request to delay to address other health issues . Pt provided with contact info and advised to call to schedule appt.   Bone Density status: Ordered not of age . Pt provided with contact info and advised to call to schedule appt.  Lung Cancer Screening: (Low Dose CT Chest recommended if Age 52-80ears, 30 pack-year currently smoking OR have quit w/in 15years.) does not qualify.   Lung Cancer Screening Referral: n/a  Additional Screening:  Hepatitis C Screening: does not qualify;   Vision Screening: Recommended annual ophthalmology exams for early detection of glaucoma and other disorders of the eye. Is the patient up to date with their annual eye exam?  No  Who is the provider or what is the name of the office in which the patient attends annual eye exams? Declined to helth issues  If pt is not established with a provider, would they like to be referred to a provider to establish care? No .   Dental Screening: Recommended annual dental exams for proper oral hygiene  Community Resource Referral / Chronic Care Management: CRR required this visit?  No   CCM required this visit?  No      Plan:     I have personally reviewed and noted the following in the patient's chart:   Medical and social history Use of alcohol, tobacco or illicit drugs  Current medications and supplements including opioid prescriptions.  Functional ability and status Nutritional status Physical activity Advanced directives List of other physicians Hospitalizations, surgeries, and ER visits in previous 12 months Vitals Screenings to include cognitive, depression, and falls Referrals and appointments  In addition, I have reviewed and discussed with patient certain preventive protocols, quality metrics, and best practice  recommendations. A written personalized care plan for preventive services as well as general preventive health recommendations were provided to patient.     LaDaphane ShepherdLPN   8/0/07/2120 Nurse Notes: none

## 2021-12-24 DIAGNOSIS — E538 Deficiency of other specified B group vitamins: Secondary | ICD-10-CM | POA: Diagnosis not present

## 2021-12-24 DIAGNOSIS — G629 Polyneuropathy, unspecified: Secondary | ICD-10-CM | POA: Diagnosis not present

## 2021-12-24 DIAGNOSIS — G4733 Obstructive sleep apnea (adult) (pediatric): Secondary | ICD-10-CM | POA: Diagnosis not present

## 2021-12-24 DIAGNOSIS — L989 Disorder of the skin and subcutaneous tissue, unspecified: Secondary | ICD-10-CM | POA: Diagnosis not present

## 2021-12-24 DIAGNOSIS — I872 Venous insufficiency (chronic) (peripheral): Secondary | ICD-10-CM | POA: Diagnosis not present

## 2021-12-24 DIAGNOSIS — G9589 Other specified diseases of spinal cord: Secondary | ICD-10-CM | POA: Diagnosis not present

## 2021-12-24 DIAGNOSIS — M300 Polyarteritis nodosa: Secondary | ICD-10-CM | POA: Diagnosis not present

## 2021-12-24 DIAGNOSIS — M4802 Spinal stenosis, cervical region: Secondary | ICD-10-CM | POA: Diagnosis not present

## 2021-12-24 DIAGNOSIS — Z87440 Personal history of urinary (tract) infections: Secondary | ICD-10-CM | POA: Diagnosis not present

## 2021-12-24 DIAGNOSIS — Z993 Dependence on wheelchair: Secondary | ICD-10-CM | POA: Diagnosis not present

## 2021-12-24 DIAGNOSIS — M5136 Other intervertebral disc degeneration, lumbar region: Secondary | ICD-10-CM | POA: Diagnosis not present

## 2021-12-24 DIAGNOSIS — G9529 Other cord compression: Secondary | ICD-10-CM | POA: Diagnosis not present

## 2021-12-24 DIAGNOSIS — G43909 Migraine, unspecified, not intractable, without status migrainosus: Secondary | ICD-10-CM | POA: Diagnosis not present

## 2021-12-24 DIAGNOSIS — K259 Gastric ulcer, unspecified as acute or chronic, without hemorrhage or perforation: Secondary | ICD-10-CM | POA: Diagnosis not present

## 2021-12-24 DIAGNOSIS — K760 Fatty (change of) liver, not elsewhere classified: Secondary | ICD-10-CM | POA: Diagnosis not present

## 2021-12-24 DIAGNOSIS — M797 Fibromyalgia: Secondary | ICD-10-CM | POA: Diagnosis not present

## 2021-12-24 DIAGNOSIS — K219 Gastro-esophageal reflux disease without esophagitis: Secondary | ICD-10-CM | POA: Diagnosis not present

## 2021-12-24 DIAGNOSIS — Z9181 History of falling: Secondary | ICD-10-CM | POA: Diagnosis not present

## 2021-12-24 DIAGNOSIS — F32A Depression, unspecified: Secondary | ICD-10-CM | POA: Diagnosis not present

## 2021-12-24 DIAGNOSIS — E785 Hyperlipidemia, unspecified: Secondary | ICD-10-CM | POA: Diagnosis not present

## 2021-12-24 DIAGNOSIS — I10 Essential (primary) hypertension: Secondary | ICD-10-CM | POA: Diagnosis not present

## 2021-12-24 DIAGNOSIS — E559 Vitamin D deficiency, unspecified: Secondary | ICD-10-CM | POA: Diagnosis not present

## 2021-12-27 DIAGNOSIS — M300 Polyarteritis nodosa: Secondary | ICD-10-CM | POA: Diagnosis not present

## 2021-12-27 DIAGNOSIS — K760 Fatty (change of) liver, not elsewhere classified: Secondary | ICD-10-CM | POA: Diagnosis not present

## 2021-12-27 DIAGNOSIS — M797 Fibromyalgia: Secondary | ICD-10-CM | POA: Diagnosis not present

## 2021-12-27 DIAGNOSIS — E785 Hyperlipidemia, unspecified: Secondary | ICD-10-CM | POA: Diagnosis not present

## 2021-12-27 DIAGNOSIS — K219 Gastro-esophageal reflux disease without esophagitis: Secondary | ICD-10-CM | POA: Diagnosis not present

## 2021-12-27 DIAGNOSIS — M5136 Other intervertebral disc degeneration, lumbar region: Secondary | ICD-10-CM | POA: Diagnosis not present

## 2021-12-27 DIAGNOSIS — M4802 Spinal stenosis, cervical region: Secondary | ICD-10-CM | POA: Diagnosis not present

## 2021-12-27 DIAGNOSIS — G43909 Migraine, unspecified, not intractable, without status migrainosus: Secondary | ICD-10-CM | POA: Diagnosis not present

## 2021-12-27 DIAGNOSIS — G4733 Obstructive sleep apnea (adult) (pediatric): Secondary | ICD-10-CM | POA: Diagnosis not present

## 2021-12-27 DIAGNOSIS — Z87440 Personal history of urinary (tract) infections: Secondary | ICD-10-CM | POA: Diagnosis not present

## 2021-12-27 DIAGNOSIS — G629 Polyneuropathy, unspecified: Secondary | ICD-10-CM | POA: Diagnosis not present

## 2021-12-27 DIAGNOSIS — Z993 Dependence on wheelchair: Secondary | ICD-10-CM | POA: Diagnosis not present

## 2021-12-27 DIAGNOSIS — F32A Depression, unspecified: Secondary | ICD-10-CM | POA: Diagnosis not present

## 2021-12-27 DIAGNOSIS — L989 Disorder of the skin and subcutaneous tissue, unspecified: Secondary | ICD-10-CM | POA: Diagnosis not present

## 2021-12-27 DIAGNOSIS — G9589 Other specified diseases of spinal cord: Secondary | ICD-10-CM | POA: Diagnosis not present

## 2021-12-27 DIAGNOSIS — E538 Deficiency of other specified B group vitamins: Secondary | ICD-10-CM | POA: Diagnosis not present

## 2021-12-27 DIAGNOSIS — E559 Vitamin D deficiency, unspecified: Secondary | ICD-10-CM | POA: Diagnosis not present

## 2021-12-27 DIAGNOSIS — Z9181 History of falling: Secondary | ICD-10-CM | POA: Diagnosis not present

## 2021-12-27 DIAGNOSIS — G9529 Other cord compression: Secondary | ICD-10-CM | POA: Diagnosis not present

## 2021-12-27 DIAGNOSIS — I872 Venous insufficiency (chronic) (peripheral): Secondary | ICD-10-CM | POA: Diagnosis not present

## 2021-12-27 DIAGNOSIS — I10 Essential (primary) hypertension: Secondary | ICD-10-CM | POA: Diagnosis not present

## 2021-12-27 DIAGNOSIS — K259 Gastric ulcer, unspecified as acute or chronic, without hemorrhage or perforation: Secondary | ICD-10-CM | POA: Diagnosis not present

## 2021-12-28 ENCOUNTER — Telehealth: Payer: Self-pay | Admitting: Family Medicine

## 2021-12-28 ENCOUNTER — Emergency Department (HOSPITAL_BASED_OUTPATIENT_CLINIC_OR_DEPARTMENT_OTHER)
Admission: EM | Admit: 2021-12-28 | Discharge: 2021-12-28 | Disposition: A | Discharge status: Home / Self Care | Payer: Medicare Other | Attending: Emergency Medicine | Admitting: Emergency Medicine

## 2021-12-28 DIAGNOSIS — E559 Vitamin D deficiency, unspecified: Secondary | ICD-10-CM | POA: Diagnosis not present

## 2021-12-28 DIAGNOSIS — G43909 Migraine, unspecified, not intractable, without status migrainosus: Secondary | ICD-10-CM | POA: Diagnosis not present

## 2021-12-28 DIAGNOSIS — M5136 Other intervertebral disc degeneration, lumbar region: Secondary | ICD-10-CM | POA: Diagnosis not present

## 2021-12-28 DIAGNOSIS — E785 Hyperlipidemia, unspecified: Secondary | ICD-10-CM | POA: Diagnosis not present

## 2021-12-28 DIAGNOSIS — Z9181 History of falling: Secondary | ICD-10-CM | POA: Diagnosis not present

## 2021-12-28 DIAGNOSIS — S81801A Unspecified open wound, right lower leg, initial encounter: Secondary | ICD-10-CM | POA: Diagnosis not present

## 2021-12-28 DIAGNOSIS — G4733 Obstructive sleep apnea (adult) (pediatric): Secondary | ICD-10-CM | POA: Diagnosis not present

## 2021-12-28 DIAGNOSIS — L03115 Cellulitis of right lower limb: Secondary | ICD-10-CM | POA: Diagnosis not present

## 2021-12-28 DIAGNOSIS — I10 Essential (primary) hypertension: Secondary | ICD-10-CM | POA: Diagnosis not present

## 2021-12-28 DIAGNOSIS — K760 Fatty (change of) liver, not elsewhere classified: Secondary | ICD-10-CM | POA: Diagnosis not present

## 2021-12-28 DIAGNOSIS — Z743 Need for continuous supervision: Secondary | ICD-10-CM | POA: Diagnosis not present

## 2021-12-28 DIAGNOSIS — Z993 Dependence on wheelchair: Secondary | ICD-10-CM | POA: Diagnosis not present

## 2021-12-28 DIAGNOSIS — K259 Gastric ulcer, unspecified as acute or chronic, without hemorrhage or perforation: Secondary | ICD-10-CM | POA: Diagnosis not present

## 2021-12-28 DIAGNOSIS — S8991XA Unspecified injury of right lower leg, initial encounter: Secondary | ICD-10-CM | POA: Diagnosis present

## 2021-12-28 DIAGNOSIS — E538 Deficiency of other specified B group vitamins: Secondary | ICD-10-CM | POA: Diagnosis not present

## 2021-12-28 DIAGNOSIS — M4802 Spinal stenosis, cervical region: Secondary | ICD-10-CM | POA: Diagnosis not present

## 2021-12-28 DIAGNOSIS — G629 Polyneuropathy, unspecified: Secondary | ICD-10-CM | POA: Diagnosis not present

## 2021-12-28 DIAGNOSIS — X58XXXA Exposure to other specified factors, initial encounter: Secondary | ICD-10-CM | POA: Insufficient documentation

## 2021-12-28 DIAGNOSIS — M300 Polyarteritis nodosa: Secondary | ICD-10-CM | POA: Diagnosis not present

## 2021-12-28 DIAGNOSIS — I872 Venous insufficiency (chronic) (peripheral): Secondary | ICD-10-CM | POA: Diagnosis not present

## 2021-12-28 DIAGNOSIS — Z7401 Bed confinement status: Secondary | ICD-10-CM | POA: Diagnosis not present

## 2021-12-28 DIAGNOSIS — Z87440 Personal history of urinary (tract) infections: Secondary | ICD-10-CM | POA: Diagnosis not present

## 2021-12-28 DIAGNOSIS — G9589 Other specified diseases of spinal cord: Secondary | ICD-10-CM | POA: Diagnosis not present

## 2021-12-28 DIAGNOSIS — K219 Gastro-esophageal reflux disease without esophagitis: Secondary | ICD-10-CM | POA: Diagnosis not present

## 2021-12-28 DIAGNOSIS — R531 Weakness: Secondary | ICD-10-CM | POA: Diagnosis not present

## 2021-12-28 DIAGNOSIS — L989 Disorder of the skin and subcutaneous tissue, unspecified: Secondary | ICD-10-CM | POA: Diagnosis not present

## 2021-12-28 DIAGNOSIS — G9529 Other cord compression: Secondary | ICD-10-CM | POA: Diagnosis not present

## 2021-12-28 DIAGNOSIS — M797 Fibromyalgia: Secondary | ICD-10-CM | POA: Diagnosis not present

## 2021-12-28 DIAGNOSIS — F32A Depression, unspecified: Secondary | ICD-10-CM | POA: Diagnosis not present

## 2021-12-28 MED ORDER — CLINDAMYCIN HCL 150 MG PO CAPS
450.0000 mg | ORAL_CAPSULE | Freq: Once | ORAL | Status: AC
  Administered 2021-12-28: 450 mg via ORAL
  Filled 2021-12-28: qty 3

## 2021-12-28 MED ORDER — CLINDAMYCIN HCL 150 MG PO CAPS: 450.0000 mg | ORAL_CAPSULE | Freq: Three times a day (TID) | ORAL | 0 refills | Status: AC

## 2021-12-28 NOTE — Telephone Encounter (Signed)
I did inform Valetta Fuller the home health nurse . They are sending the pt to the ER

## 2021-12-28 NOTE — Telephone Encounter (Signed)
Spoke w/ [name] pt has a wound on right lower leg is draining a green thick discharge . [name] states that [name] denies any pain around wound  They are asking if they should send her to ER or wait till her apt with Dr Birdie Riddle on [address] ? And they are asking if we can have a home health nurse twice a week for  this pt . I .

## 2021-12-28 NOTE — ED Notes (Signed)
Gave pt orange juice, pt stated that she felt fine

## 2021-12-28 NOTE — Discharge Instructions (Addendum)
You were seen in the emergency department for concern for infection of your wound.  You did have cellulitis which is a skin infection of your leg near your wound.  We did send a wound culture and should this grow a bacteria that is not sensitive to clindamycin you will receive a call and have a new antibiotic sent.  You should complete your antibiotic as prescribed.  You should have your wound rechecked by your primary doctor in the next few days to ensure that it is healing appropriately.  You should return to the emergency department if you are having spreading redness up your leg, your fevers, you have repetitive vomiting and cannot tolerate the antibiotics or if you have any other new or concerning symptoms.

## 2021-12-28 NOTE — Telephone Encounter (Signed)
Caller name: [name]  On DPR? :yes/no: Yes  Call back number:[phone]  Provider they see: Birdie Riddle   Reason for call: [name] called stating the [name] has a wound on lower right leg. [name] states that [name] had some greenish puff coming out her leg(needs to be looked at asap). [name] also states that [name] needs a nurse visit twice of week.

## 2021-12-28 NOTE — Telephone Encounter (Signed)
[place] for verbal order for twice weekly home health nursing.  As for the wound, if it's draining thick, green discharge, this needs to be evaluated today and not wait until Thursday.  And if she goes to the ER for evaluation, my hope is that social work may be able to help place her or get her in a better situation where she will have more help taking care of herself

## 2021-12-28 NOTE — ED Triage Notes (Signed)
Pt arrived via EMS ([facility]) from home. Per EMS, pt is bed bound and home health care visited pt today and was concerned wound may be infected on R lower leg. Pt caox4 c/o pain 6/10, afebrile.

## 2021-12-28 NOTE — ED Provider Notes (Signed)
[facility] EMERGENCY DEPT Provider Note   CSN: [id number] Arrival date & time: 12/28/21  1528     History  Chief Complaint  Patient presents with   Wound Infection    [name] is a 51 y.o. female.  Patient is a 51 year old female with a past medical history of obesity and bedbound at home with chronic right lower leg wound presenting to the emergency department for concern for wound infection.  The patient states that she has a wound care nurse that changes her dressings twice a week.  She states that today she was concerned that the redness around her wound has been increasing and recommended that she come to the emergency department to be evaluated.  The patient states that she has had no significant purulent drainage.  She denies significant pain.  She denies fevers or chills, nausea or vomiting.  She states that she has had cellulitis around this wound in the past and has been treated with clindamycin successfully.  She states that she does see a wound care doctor as well.  The history is provided by the patient.       Home Medications Prior to Admission medications   Medication Sig Start Date End Date Taking? Authorizing Provider  ALPRAZolam Duanne Moron) 0.5 MG tablet TAKE 1 TABLET BY MOUTH 2 TIMES DAILY 09/15/21   [name], MD  ARIPiprazole (ABILIFY) 15 MG tablet Take 1 tablet (15 mg total) by mouth daily. 09/14/21   [name], MD  Armodafinil 250 MG tablet Take 250 mg by mouth in the morning and at bedtime. 11/12/19   [provider]  Biotin 10 MG TABS Take 10 mg by mouth in the morning.  07/25/18   [provider]  buPROPion (WELLBUTRIN XL) 300 MG 24 hr tablet TAKE 1 TABLET BY MOUTH EVERY DAY 10/28/21   [name], MD  calcium citrate (CALCITRATE - DOSED IN MG ELEMENTAL CALCIUM) 950 MG tablet Take 1 tablet by mouth daily.    [provider]  clonazePAM (KLONOPIN) 1 MG tablet TAKE 1 TABLET BY MOUTH AT BEDTIME  12/08/21   [name], MD  cyclobenzaprine (FLEXERIL) 10 MG tablet TAKE 2 TABLETS BY MOUTH 2 TIMES DAILY 11/10/21   [name], MD  diclofenac Sodium (VOLTAREN) 1 % GEL Apply 4 g topically 4 (four) times daily. 02/11/21   [provider]  diphenhydrAMINE (BENADRYL) 25 MG tablet Take 25 mg by mouth 2 (two) times daily as needed for allergies.    [provider]  DULoxetine (CYMBALTA) 60 MG capsule Take 2 capsules (120 mg total) by mouth daily. Patient taking differently: Take 60 mg by mouth 2 (two) times daily. 06/22/15   [name], MD  FEROSUL 325 (65 Fe) MG tablet TAKE 1 TABLET BY MOUTH EVERY DAY WITH BREAKFAST 10/28/21   [name], MD  folic acid (FOLVITE) 1 MG tablet TAKE 1 TABLET BY MOUTH EVERY DAY 10/28/21   [name], MD  furosemide (LASIX) 40 MG tablet TAKE 1 TABLET BY MOUTH EVERY DAY MAY take additional TABLET IF weight INCREASE by 5lb AND call MD FOR further instructions Patient taking differently: Take 40 mg by mouth daily. 1t qd-may take an additional tablet if weight increases by 5lbs. 05/19/21   [name], MD  gabapentin (NEURONTIN) 600 MG tablet Take 1 tablet (600 mg total) by mouth 3 (three) times daily. TAKE 2 TABLETS BY MOUTH 3 TIMES DAILY Patient taking differently: Take 600  mg by mouth 3 (three) times daily. 07/09/20   [name], MD  HYDROcodone-acetaminophen (NORCO) 10-325 MG tablet TAKE 1 TABLET BY MOUTH EVERY 6 HOURS AS NEEDED 12/21/21   [name], MD  hydrOXYzine (VISTARIL) 25 MG capsule Take 1 capsule (25 mg total) by mouth every 8 (eight) hours as needed. 10/21/21   [name], MD  nebivolol (BYSTOLIC) 10 MG tablet TAKE 1 TABLET BY MOUTH EVERY DAY 10/28/21   [name], MD  nystatin (MYCOSTATIN/NYSTOP) powder APPLY 1 APPLICATION TOPICALLY 3 TIMES DAILY 12/20/21   [name], MD  Oxcarbazepine (TRILEPTAL) 300 MG tablet Take 300 mg by mouth 2 (two) times daily.    [provider]  pantoprazole (PROTONIX) 40 MG tablet TAKE 1 TABLET BY MOUTH 2 TIMES DAILY 10/28/21   [name], MD  phentermine 37.5 MG capsule Take 1 capsule (37.5 mg total) by mouth every morning. 10/06/21 01/04/22  [name], MD  promethazine (PHENERGAN) 25 MG tablet TAKE 1 TABLET BY MOUTH EVERY 6 HOURS AS NEEDED FOR NAUSEA AND VOMITING 11/10/21   [name], MD  pyridOXINE (VITAMIN B-6) 100 MG tablet TAKE 1 TABLET BY MOUTH EVERY DAY 10/28/21   [name], MD  rosuvastatin (CRESTOR) 20 MG tablet TAKE 1 TABLET BY MOUTH EVERY DAY 09/01/21   [name], MD  senna-docusate (SENOKOT-S) 8.6-50 MG tablet Take 1 tablet by mouth at bedtime. Patient taking differently: Take 1 tablet by mouth daily as needed for mild constipation. 08/02/19   Florencia Reasons, MD  SKYRIZI 150 MG/ML SOSY Inject 150 mg into the skin every 3 (three) months. 02/15/21   [provider]  thiamine 100 MG tablet TAKE 1 TABLET BY MOUTH EVERY DAY 10/28/21   [name], MD  vitamin B-12 (CYANOCOBALAMIN) 1000 MCG tablet TAKE 1 TABLET BY MOUTH EVERY DAY 10/28/21   [name], MD      Allergies    Niacin, Sulfamethoxazole-trimethoprim, Aspirin, Bactrim, Benzoin compound, Cephalexin, Iohexol, Lisinopril, Naproxen, Pnu-imune [pneumococcal polysaccharide vaccine], and Sulfonamide derivatives    Review of Systems   Review of Systems  Physical Exam Updated Vital Signs BP 125/75 (BP Location: Left Wrist)   Pulse 85   Temp 98.4 F (36.9 C) (Oral)   Resp 16   SpO2 95%  Physical Exam Vitals and nursing note reviewed.  Constitutional:      General: She is not in acute distress.    Appearance: Normal appearance. She is obese.  HENT:     Head: Normocephalic and atraumatic.     Mouth/Throat:     Mouth: Mucous membranes are moist.     Pharynx: Oropharynx is clear.  Eyes:     Extraocular Movements: Extraocular movements intact.     Conjunctiva/sclera: Conjunctivae normal.   Cardiovascular:     Rate and Rhythm: Normal rate and regular rhythm.     Pulses: Normal pulses.  Pulmonary:     Effort: Pulmonary effort is normal.     Breath sounds: Normal breath sounds.  Abdominal:     General: Abdomen is flat.  Musculoskeletal:     Cervical back: Normal range of motion and neck supple.     Comments: Please see attached photos.  Stage I ulcer on posterior right calf with surrounding erythema and warmth of entire calf.  No significant swelling.  Patient's leg is warm and well-perfused  Skin:    General: Skin is warm and dry.     Capillary Refill: Capillary  refill takes less than 2 seconds.  Neurological:     General: No focal deficit present.     Mental Status: She is alert and oriented to person, place, and time.  Psychiatric:        Mood and Affect: Mood normal.        Behavior: Behavior normal.        ED Results / Procedures / Treatments   Labs (all labs ordered are listed, but only abnormal results are displayed) Labs Reviewed  AEROBIC CULTURE W GRAM STAIN (SUPERFICIAL SPECIMEN)    EKG None  Radiology No results found.  Procedures Procedures    Medications Ordered in ED Medications  clindamycin (CLEOCIN) capsule 450 mg (has no administration in time range)    ED Course/ Medical Decision Making/ A&P                           Medical Decision Making This patient presents to the ED with chief complaint(s) of concern for infection of her right lower extremity wound with pertinent past medical history of obesity bedbound at baseline which further complicates the presenting complaint. The complaint involves an extensive differential diagnosis and also carries with it a high risk of complications and morbidity.    The differential diagnosis includes cellulitis, wound is superficial, making osteomyelitis unlikely, the patient has no signs of sepsis on history or exam, patient has no calf swelling and with the redness surrounding her from her  wound making DVT unlikely.  The patient's has good palpable pulses and strength and sensation is intact making acute limb ischemia unlikely  Additional history obtained: Records reviewed Primary Care Documents  ED Course and Reassessment: Patient's wound was dressed and she will be started on clindamycin for her cellulitis.  She has good primary care and wound care follow-up.  She was given strict return precautions.  Patient is agreeable with the plan.  Independent labs interpretation:  The following labs were independently interpreted: N/A  Independent visualization of imaging: N/A  Consultation: - Consulted or discussed management/test interpretation w/ external professional: N/A  Consideration for admission or further workup: Patient is able to tolerate p.o. antibiotics and without any signs of sepsis she does not require admission or further work-up for her cellulitis Social Determinants of health: Bedbound    Risk Prescription drug management.           Final Clinical Impression(s) / ED Diagnoses Final diagnoses:  None    Rx / DC Orders ED Discharge Orders     None         Ottie Glazier, DO 12/28/21 1743

## 2021-12-28 NOTE — ED Notes (Signed)
[facility] here for transport, pt being transported home, d/c paper

## 2021-12-29 DIAGNOSIS — F32A Depression, unspecified: Secondary | ICD-10-CM | POA: Diagnosis not present

## 2021-12-29 DIAGNOSIS — G43909 Migraine, unspecified, not intractable, without status migrainosus: Secondary | ICD-10-CM | POA: Diagnosis not present

## 2021-12-29 DIAGNOSIS — G9589 Other specified diseases of spinal cord: Secondary | ICD-10-CM | POA: Diagnosis not present

## 2021-12-29 DIAGNOSIS — E785 Hyperlipidemia, unspecified: Secondary | ICD-10-CM | POA: Diagnosis not present

## 2021-12-29 DIAGNOSIS — M797 Fibromyalgia: Secondary | ICD-10-CM | POA: Diagnosis not present

## 2021-12-29 DIAGNOSIS — G9529 Other cord compression: Secondary | ICD-10-CM | POA: Diagnosis not present

## 2021-12-29 DIAGNOSIS — M300 Polyarteritis nodosa: Secondary | ICD-10-CM | POA: Diagnosis not present

## 2021-12-29 DIAGNOSIS — K219 Gastro-esophageal reflux disease without esophagitis: Secondary | ICD-10-CM | POA: Diagnosis not present

## 2021-12-29 DIAGNOSIS — K760 Fatty (change of) liver, not elsewhere classified: Secondary | ICD-10-CM | POA: Diagnosis not present

## 2021-12-29 DIAGNOSIS — M5136 Other intervertebral disc degeneration, lumbar region: Secondary | ICD-10-CM | POA: Diagnosis not present

## 2021-12-29 DIAGNOSIS — I872 Venous insufficiency (chronic) (peripheral): Secondary | ICD-10-CM | POA: Diagnosis not present

## 2021-12-29 DIAGNOSIS — M4802 Spinal stenosis, cervical region: Secondary | ICD-10-CM | POA: Diagnosis not present

## 2021-12-29 DIAGNOSIS — Z9181 History of falling: Secondary | ICD-10-CM | POA: Diagnosis not present

## 2021-12-29 DIAGNOSIS — G4733 Obstructive sleep apnea (adult) (pediatric): Secondary | ICD-10-CM | POA: Diagnosis not present

## 2021-12-29 DIAGNOSIS — E538 Deficiency of other specified B group vitamins: Secondary | ICD-10-CM | POA: Diagnosis not present

## 2021-12-29 DIAGNOSIS — I10 Essential (primary) hypertension: Secondary | ICD-10-CM | POA: Diagnosis not present

## 2021-12-29 DIAGNOSIS — E559 Vitamin D deficiency, unspecified: Secondary | ICD-10-CM | POA: Diagnosis not present

## 2021-12-29 DIAGNOSIS — Z993 Dependence on wheelchair: Secondary | ICD-10-CM | POA: Diagnosis not present

## 2021-12-29 DIAGNOSIS — Z87440 Personal history of urinary (tract) infections: Secondary | ICD-10-CM | POA: Diagnosis not present

## 2021-12-29 DIAGNOSIS — L989 Disorder of the skin and subcutaneous tissue, unspecified: Secondary | ICD-10-CM | POA: Diagnosis not present

## 2021-12-29 DIAGNOSIS — K259 Gastric ulcer, unspecified as acute or chronic, without hemorrhage or perforation: Secondary | ICD-10-CM | POA: Diagnosis not present

## 2021-12-29 DIAGNOSIS — G629 Polyneuropathy, unspecified: Secondary | ICD-10-CM | POA: Diagnosis not present

## 2021-12-30 ENCOUNTER — Telehealth (INDEPENDENT_AMBULATORY_CARE_PROVIDER_SITE_OTHER): Payer: Medicare Other | Admitting: Family Medicine

## 2021-12-30 ENCOUNTER — Encounter: Payer: Self-pay | Admitting: Family Medicine

## 2021-12-30 ENCOUNTER — Ambulatory Visit: Payer: Medicare Other | Admitting: Family Medicine

## 2021-12-30 ENCOUNTER — Ambulatory Visit: Payer: Commercial Managed Care - HMO | Admitting: Neurology

## 2021-12-30 DIAGNOSIS — B372 Candidiasis of skin and nail: Secondary | ICD-10-CM | POA: Diagnosis not present

## 2021-12-30 DIAGNOSIS — L03115 Cellulitis of right lower limb: Secondary | ICD-10-CM | POA: Diagnosis not present

## 2021-12-30 NOTE — Progress Notes (Signed)
Virtual Visit via Video   I connected with patient on 12/30/21 at  1:00 PM EDT by a video enabled telemedicine application and verified that I am speaking with the correct person using two identifiers.  Location patient: Home Location provider: [facility] Persons participating in the virtual visit: Patient, Provider, Clayton Marcille Blanco C)  I discussed the limitations of evaluation and management by telemedicine and the availability of in person appointments. The patient expressed understanding and agreed to proceed.  Subjective:   HPI:   ER f/u- pt was seen for a wound on her R leg on 8/15.  She was started on Clindamycin '450mg'$  TID x5 days.  Pt reports 'leg is looking better already'.  No longer painful.  L leg is currently wound free.  Pt reports groin rash has improved w/ nystatin powder.  'it's magic'.  'it's pretty well gone'.    ROS:   See pertinent positives and negatives per HPI.  Patient Active Problem List   Diagnosis Date Noted   Positive blood cultures 08/14/2021   PNA (pneumonia) 08/12/2021   Fever of unknown origin 08/12/2021   Sacral pressure sore 07/06/2021   Vitamin B6 deficiency 06/08/2021   Myelomalacia of cervical cord ([place]) 06/06/2021   Compressive cervical cord myelomalacia ([place]) 06/05/2021   B12 deficiency 06/05/2021   Folate deficiency 06/05/2021   Vitamin D deficiency 06/05/2021   Leg weakness, bilateral 06/04/2021   Cervical atypia 01/06/2021   Steatosis of liver 01/06/2021   Hyponatremia 07/27/2019   Urinary urgency 04/04/2018   Chronic narcotic use 09/07/2016   Acute blood loss anemia 01/14/2016   Multiple gastric ulcers    PAN (polyarteritis nodosa) ([place]) 11/24/2015   GERD (gastroesophageal reflux disease) 01/08/2014   Routine general medical examination at a health care facility 04/23/2013   Bariatric surgery status 10/31/2012   S/P skin biopsy 10/31/2012   Psoriasis 01/09/2012   DDD (degenerative disc disease), lumbar  11/30/2011   Migraine headache    [name] (obstructive sleep apnea) 11/16/2010   HYPERGLYCEMIA, FASTING 10/08/2009   CONTRACTURE OF TENDON 08/17/2009   PARESTHESIA, HANDS 12/23/2008   Leg pain, right 11/05/2008   ADVERSE DRUG REACTION 04/03/2008   PERIPHERAL EDEMA 03/26/2008   Obesity, Class III, BMI 40-49.9 (morbid obesity) ([place]) 01/03/2007   Cellulitis 01/03/2007   Hyperlipidemia 09/22/2006   Fibromyalgia 09/20/2006   Anxiety and depression 06/28/2006   Essential hypertension 06/28/2006    Social History   Tobacco Use   Smoking status: Never   Smokeless tobacco: Never  Substance Use Topics   Alcohol use: No    Current Outpatient Medications:    ALPRAZolam (XANAX) 0.5 MG tablet, TAKE 1 TABLET BY MOUTH 2 TIMES DAILY, Disp: 30 tablet, Rfl: 3   ARIPiprazole (ABILIFY) 15 MG tablet, Take 1 tablet (15 mg total) by mouth daily., Disp: 90 tablet, Rfl: 1   Armodafinil 250 MG tablet, Take 250 mg by mouth in the morning and at bedtime., Disp: , Rfl:    Biotin 10 MG TABS, Take 10 mg by mouth in the morning. , Disp: , Rfl:    buPROPion (WELLBUTRIN XL) 300 MG 24 hr tablet, TAKE 1 TABLET BY MOUTH EVERY DAY, Disp: 30 tablet, Rfl: 2   calcium citrate (CALCITRATE - DOSED IN MG ELEMENTAL CALCIUM) 950 MG tablet, Take 1 tablet by mouth daily., Disp: , Rfl:    clindamycin (CLEOCIN) 150 MG capsule, Take 3 capsules (450 mg total) by mouth 3 (three) times daily for 5 days., Disp: 45 capsule, Rfl: 0  clonazePAM (KLONOPIN) 1 MG tablet, TAKE 1 TABLET BY MOUTH AT BEDTIME, Disp: 30 tablet, Rfl: 3   cyclobenzaprine (FLEXERIL) 10 MG tablet, TAKE 2 TABLETS BY MOUTH 2 TIMES DAILY, Disp: 120 tablet, Rfl: 2   diclofenac Sodium (VOLTAREN) 1 % GEL, Apply 4 g topically 4 (four) times daily., Disp: , Rfl:    diphenhydrAMINE (BENADRYL) 25 MG tablet, Take 25 mg by mouth 2 (two) times daily as needed for allergies., Disp: , Rfl:    DULoxetine (CYMBALTA) 60 MG capsule, Take 2 capsules (120 mg total) by mouth daily. (Patient  taking differently: Take 60 mg by mouth 2 (two) times daily.), Disp: 240 capsule, Rfl: 2   FEROSUL 325 (65 Fe) MG tablet, TAKE 1 TABLET BY MOUTH EVERY DAY WITH BREAKFAST, Disp: 30 tablet, Rfl: 2   folic acid (FOLVITE) 1 MG tablet, TAKE 1 TABLET BY MOUTH EVERY DAY, Disp: 30 tablet, Rfl: 2   gabapentin (NEURONTIN) 600 MG tablet, Take 1 tablet (600 mg total) by mouth 3 (three) times daily. TAKE 2 TABLETS BY MOUTH 3 TIMES DAILY (Patient taking differently: Take 600 mg by mouth 3 (three) times daily.), Disp: 270 tablet, Rfl: 0   HYDROcodone-acetaminophen (NORCO) 10-325 MG tablet, TAKE 1 TABLET BY MOUTH EVERY 6 HOURS AS NEEDED, Disp: 90 tablet, Rfl: 0   nebivolol (BYSTOLIC) 10 MG tablet, TAKE 1 TABLET BY MOUTH EVERY DAY, Disp: 30 tablet, Rfl: 2   nystatin (MYCOSTATIN/NYSTOP) powder, APPLY 1 APPLICATION TOPICALLY 3 TIMES DAILY, Disp: 60 g, Rfl: 3   Oxcarbazepine (TRILEPTAL) 300 MG tablet, Take 300 mg by mouth 2 (two) times daily., Disp: , Rfl:    pantoprazole (PROTONIX) 40 MG tablet, TAKE 1 TABLET BY MOUTH 2 TIMES DAILY, Disp: 60 tablet, Rfl: 2   phentermine 37.5 MG capsule, Take 1 capsule (37.5 mg total) by mouth every morning., Disp: 30 capsule, Rfl: 2   promethazine (PHENERGAN) 25 MG tablet, TAKE 1 TABLET BY MOUTH EVERY 6 HOURS AS NEEDED FOR NAUSEA AND VOMITING, Disp: 45 tablet, Rfl: 3   pyridOXINE (VITAMIN B-6) 100 MG tablet, TAKE 1 TABLET BY MOUTH EVERY DAY, Disp: 30 tablet, Rfl: 2   rosuvastatin (CRESTOR) 20 MG tablet, TAKE 1 TABLET BY MOUTH EVERY DAY, Disp: 90 tablet, Rfl: 2   senna-docusate (SENOKOT-S) 8.6-50 MG tablet, Take 1 tablet by mouth at bedtime. (Patient taking differently: Take 1 tablet by mouth daily as needed for mild constipation.), Disp: 30 tablet, Rfl: 0   SKYRIZI 150 MG/ML SOSY, Inject 150 mg into the skin every 3 (three) months., Disp: , Rfl:    thiamine 100 MG tablet, TAKE 1 TABLET BY MOUTH EVERY DAY, Disp: 30 tablet, Rfl: 2   vitamin B-12 (CYANOCOBALAMIN) 1000 MCG tablet, TAKE 1  TABLET BY MOUTH EVERY DAY, Disp: 30 tablet, Rfl: 2   furosemide (LASIX) 40 MG tablet, TAKE 1 TABLET BY MOUTH EVERY DAY MAY take additional TABLET IF weight INCREASE by 5lb AND call MD FOR further instructions (Patient taking differently: Take 40 mg by mouth daily. 1t qd-may take an additional tablet if weight increases by 5lbs.), Disp: 30 tablet, Rfl: 1   hydrOXYzine (VISTARIL) 25 MG capsule, Take 1 capsule (25 mg total) by mouth every 8 (eight) hours as needed. (Patient not taking: Reported on 12/30/2021), Disp: 60 capsule, Rfl: 0  Allergies  Allergen Reactions   Niacin Anaphylaxis, Shortness Of Breath and Swelling    Swelling and "problems breathing"   Sulfamethoxazole-Trimethoprim Anaphylaxis, Swelling, Rash and Other (See Comments)    Throat closed and  the eyes became swollen   Aspirin Hives   Bactrim Hives   Benzoin Compound Rash   Cephalexin Hives    Tolerated ceftriaxone and cefepime 07/2019   Iohexol Hives    Pt treated with PO benedryl   Lisinopril Hives   Naproxen Hives   Pnu-Imune [Pneumococcal Polysaccharide Vaccine] Rash   Sulfonamide Derivatives Rash    Objective:   There were no vitals taken for this visit. AAOx3, NAD Obese, lying in bed NCAT, EOMI No obvious CN deficits Coloring WNL Pt is able to speak clearly, coherently without shortness of breath or increased work of breathing.  Thought process is linear.  Mood is appropriate.   Assessment and Plan:   Cellulitis- improving w/ addition of Clindamycin by ER.  Pt states leg is now pink rather than deep, angry red.  No pain.  No drainage.  Will complete abx as directed.  Candidiasis- pt reports Nystatin powder cleared up the area of concern 'almost overnight'.  Encouraged her to keep skin clean and dry and to use the powder if she again develops sxs.  Pt expressed understanding and is in agreement w/ plan.    [name], MD 12/30/2021

## 2021-12-31 DIAGNOSIS — Z9181 History of falling: Secondary | ICD-10-CM | POA: Diagnosis not present

## 2021-12-31 DIAGNOSIS — K219 Gastro-esophageal reflux disease without esophagitis: Secondary | ICD-10-CM | POA: Diagnosis not present

## 2021-12-31 DIAGNOSIS — G629 Polyneuropathy, unspecified: Secondary | ICD-10-CM | POA: Diagnosis not present

## 2021-12-31 DIAGNOSIS — Z993 Dependence on wheelchair: Secondary | ICD-10-CM | POA: Diagnosis not present

## 2021-12-31 DIAGNOSIS — M300 Polyarteritis nodosa: Secondary | ICD-10-CM | POA: Diagnosis not present

## 2021-12-31 DIAGNOSIS — L989 Disorder of the skin and subcutaneous tissue, unspecified: Secondary | ICD-10-CM | POA: Diagnosis not present

## 2021-12-31 DIAGNOSIS — Z87440 Personal history of urinary (tract) infections: Secondary | ICD-10-CM | POA: Diagnosis not present

## 2021-12-31 DIAGNOSIS — M5136 Other intervertebral disc degeneration, lumbar region: Secondary | ICD-10-CM | POA: Diagnosis not present

## 2021-12-31 DIAGNOSIS — I872 Venous insufficiency (chronic) (peripheral): Secondary | ICD-10-CM | POA: Diagnosis not present

## 2021-12-31 DIAGNOSIS — G9589 Other specified diseases of spinal cord: Secondary | ICD-10-CM | POA: Diagnosis not present

## 2021-12-31 DIAGNOSIS — G4733 Obstructive sleep apnea (adult) (pediatric): Secondary | ICD-10-CM | POA: Diagnosis not present

## 2021-12-31 DIAGNOSIS — M4802 Spinal stenosis, cervical region: Secondary | ICD-10-CM | POA: Diagnosis not present

## 2021-12-31 DIAGNOSIS — F32A Depression, unspecified: Secondary | ICD-10-CM | POA: Diagnosis not present

## 2021-12-31 DIAGNOSIS — K259 Gastric ulcer, unspecified as acute or chronic, without hemorrhage or perforation: Secondary | ICD-10-CM | POA: Diagnosis not present

## 2021-12-31 DIAGNOSIS — I10 Essential (primary) hypertension: Secondary | ICD-10-CM | POA: Diagnosis not present

## 2021-12-31 DIAGNOSIS — G9529 Other cord compression: Secondary | ICD-10-CM | POA: Diagnosis not present

## 2021-12-31 DIAGNOSIS — M797 Fibromyalgia: Secondary | ICD-10-CM | POA: Diagnosis not present

## 2021-12-31 DIAGNOSIS — K760 Fatty (change of) liver, not elsewhere classified: Secondary | ICD-10-CM | POA: Diagnosis not present

## 2021-12-31 DIAGNOSIS — G43909 Migraine, unspecified, not intractable, without status migrainosus: Secondary | ICD-10-CM | POA: Diagnosis not present

## 2021-12-31 DIAGNOSIS — E538 Deficiency of other specified B group vitamins: Secondary | ICD-10-CM | POA: Diagnosis not present

## 2021-12-31 DIAGNOSIS — E785 Hyperlipidemia, unspecified: Secondary | ICD-10-CM | POA: Diagnosis not present

## 2021-12-31 DIAGNOSIS — E559 Vitamin D deficiency, unspecified: Secondary | ICD-10-CM | POA: Diagnosis not present

## 2022-01-02 LAB — AEROBIC CULTURE W GRAM STAIN (SUPERFICIAL SPECIMEN): Gram Stain: NONE SEEN

## 2022-01-03 ENCOUNTER — Telehealth (HOSPITAL_BASED_OUTPATIENT_CLINIC_OR_DEPARTMENT_OTHER): Payer: Self-pay | Admitting: *Deleted

## 2022-01-03 ENCOUNTER — Encounter: Payer: Self-pay | Admitting: Family Medicine

## 2022-01-03 DIAGNOSIS — G9529 Other cord compression: Secondary | ICD-10-CM | POA: Diagnosis not present

## 2022-01-03 DIAGNOSIS — I10 Essential (primary) hypertension: Secondary | ICD-10-CM | POA: Diagnosis not present

## 2022-01-03 DIAGNOSIS — Z993 Dependence on wheelchair: Secondary | ICD-10-CM | POA: Diagnosis not present

## 2022-01-03 DIAGNOSIS — E559 Vitamin D deficiency, unspecified: Secondary | ICD-10-CM | POA: Diagnosis not present

## 2022-01-03 DIAGNOSIS — E785 Hyperlipidemia, unspecified: Secondary | ICD-10-CM | POA: Diagnosis not present

## 2022-01-03 DIAGNOSIS — L989 Disorder of the skin and subcutaneous tissue, unspecified: Secondary | ICD-10-CM | POA: Diagnosis not present

## 2022-01-03 DIAGNOSIS — K219 Gastro-esophageal reflux disease without esophagitis: Secondary | ICD-10-CM | POA: Diagnosis not present

## 2022-01-03 DIAGNOSIS — G4733 Obstructive sleep apnea (adult) (pediatric): Secondary | ICD-10-CM | POA: Diagnosis not present

## 2022-01-03 DIAGNOSIS — M4802 Spinal stenosis, cervical region: Secondary | ICD-10-CM | POA: Diagnosis not present

## 2022-01-03 DIAGNOSIS — K760 Fatty (change of) liver, not elsewhere classified: Secondary | ICD-10-CM | POA: Diagnosis not present

## 2022-01-03 DIAGNOSIS — K259 Gastric ulcer, unspecified as acute or chronic, without hemorrhage or perforation: Secondary | ICD-10-CM | POA: Diagnosis not present

## 2022-01-03 DIAGNOSIS — Z9181 History of falling: Secondary | ICD-10-CM | POA: Diagnosis not present

## 2022-01-03 DIAGNOSIS — M300 Polyarteritis nodosa: Secondary | ICD-10-CM | POA: Diagnosis not present

## 2022-01-03 DIAGNOSIS — M5136 Other intervertebral disc degeneration, lumbar region: Secondary | ICD-10-CM | POA: Diagnosis not present

## 2022-01-03 DIAGNOSIS — I872 Venous insufficiency (chronic) (peripheral): Secondary | ICD-10-CM | POA: Diagnosis not present

## 2022-01-03 DIAGNOSIS — F32A Depression, unspecified: Secondary | ICD-10-CM | POA: Diagnosis not present

## 2022-01-03 DIAGNOSIS — G629 Polyneuropathy, unspecified: Secondary | ICD-10-CM | POA: Diagnosis not present

## 2022-01-03 DIAGNOSIS — M797 Fibromyalgia: Secondary | ICD-10-CM | POA: Diagnosis not present

## 2022-01-03 DIAGNOSIS — G9589 Other specified diseases of spinal cord: Secondary | ICD-10-CM | POA: Diagnosis not present

## 2022-01-03 DIAGNOSIS — G43909 Migraine, unspecified, not intractable, without status migrainosus: Secondary | ICD-10-CM | POA: Diagnosis not present

## 2022-01-03 DIAGNOSIS — Z87440 Personal history of urinary (tract) infections: Secondary | ICD-10-CM | POA: Diagnosis not present

## 2022-01-03 DIAGNOSIS — E538 Deficiency of other specified B group vitamins: Secondary | ICD-10-CM | POA: Diagnosis not present

## 2022-01-03 NOTE — Progress Notes (Signed)
ED Antimicrobial Stewardship Positive Culture Follow Up   [name] is an 51 y.o. female who presented to [facility] on 12/28/2021 with a chief complaint of  Chief Complaint  Patient presents with   Wound Infection    Recent Results (from the past 720 hour(s))  Aerobic Culture w Gram Stain (superficial specimen)     Status: None   Collection Time: 12/28/21  5:00 PM   Specimen: Wound  Result Value Ref Range Status   Specimen Description   Final    WOUND Performed at [facility], [address], [place], [place] [address]    Special Requests   Final    RIGHT POSTERIOR LEG Performed at [facility], [place], [place] [address]    Gram Stain   Final    NO ORGANISMS SEEN NO WBC SEEN Performed at [facility], [place] [address] [place], [place] [address]    Culture   Final    FEW STAPHYLOCOCCUS AUREUS FEW ENTEROCOCCUS FAECALIS RARE PSEUDOMONAS AERUGINOSA    Report Status 01/02/2022 FINAL  Final   Organism ID, Bacteria STAPHYLOCOCCUS AUREUS  Final   Organism ID, Bacteria ENTEROCOCCUS FAECALIS  Final   Organism ID, Bacteria PSEUDOMONAS AERUGINOSA  Final      Susceptibility   Enterococcus faecalis - MIC*    AMPICILLIN <=2 SENSITIVE Sensitive     VANCOMYCIN 2 SENSITIVE Sensitive     GENTAMICIN SYNERGY SENSITIVE Sensitive     * FEW ENTEROCOCCUS FAECALIS   Pseudomonas aeruginosa - MIC*    CEFTAZIDIME 2 SENSITIVE Sensitive     CIPROFLOXACIN <=0.25 SENSITIVE Sensitive     GENTAMICIN <=1 SENSITIVE Sensitive     IMIPENEM 2 SENSITIVE Sensitive     PIP/TAZO 8 SENSITIVE Sensitive     CEFEPIME 2 SENSITIVE Sensitive     * RARE PSEUDOMONAS AERUGINOSA   Staphylococcus aureus - MIC*    CIPROFLOXACIN <=0.5 SENSITIVE Sensitive     ERYTHROMYCIN <=0.25 SENSITIVE Sensitive     GENTAMICIN <=0.5 SENSITIVE Sensitive     OXACILLIN 0.5 SENSITIVE Sensitive     TETRACYCLINE >=16 RESISTANT Resistant     VANCOMYCIN <=0.5 SENSITIVE  Sensitive     TRIMETH/SULFA <=10 SENSITIVE Sensitive     CLINDAMYCIN <=0.25 SENSITIVE Sensitive     RIFAMPIN <=0.5 SENSITIVE Sensitive     Inducible Clindamycin NEGATIVE Sensitive     * FEW STAPHYLOCOCCUS AUREUS    '[x]'$  Treated with clindamycin.    Patient prescribed clindamycin and culture found to have few MSSA, few e. Faecalis, rare pseudomonas. Seen on 8/17 and pt reported "leg is looking better already" no pain reported and pink vs. Deep angry red noted. Discussed with Dr. Ernesto Rutherford, and will hold on further antibiotics at this time since significant improvement noted and superficial culture.   ED Provider: Oneal Deputy, DO   Cristela Felt, PharmD, BCPS Clinical Pharmacist  Clinical Pharmacist Monday - Friday phone -  [phone] Saturday - Sunday phone - [phone]

## 2022-01-03 NOTE — Telephone Encounter (Signed)
Post ED Visit - Positive Culture Follow-up  Culture report reviewed by antimicrobial stewardship pharmacist: [place] Team '[]'$  Elenor Quinones, Pharm.D. '[]'$  Heide Guile, Pharm.D., BCPS AQ-ID '[]'$  Parks Neptune, Pharm.D., BCPS '[]'$  Alycia Rossetti, Pharm.D., BCPS '[]'$  [place], Pharm.D., BCPS, AAHIVP '[]'$  Legrand Como, Pharm.D., BCPS, AAHIVP '[]'$  Salome Arnt, PharmD, BCPS '[]'$  Johnnette Gourd, PharmD, BCPS '[]'$  Hughes Better, PharmD, BCPS '[]'$  Leeroy Cha, PharmD '[]'$  Laqueta Linden, PharmD, BCPS '[x]'$  Cristela Felt, PharmD  [place] Team '[]'$  Leodis Sias, PharmD '[]'$  Lindell Spar, PharmD '[]'$  Royetta Asal, PharmD '[]'$  Graylin Shiver, Rph '[]'$  Rema Fendt) Glennon Mac, PharmD '[]'$  Arlyn Dunning, PharmD '[]'$  Netta Cedars, PharmD '[]'$  Dia Sitter, PharmD '[]'$  Leone Haven, PharmD '[]'$  Gretta Arab, PharmD '[]'$  Theodis Shove, PharmD '[]'$  Peggyann Juba, PharmD '[]'$  Reuel Boom, PharmD   Positive aerobic culture Treated with Clindamycin, organism sensitive to the same and no further patient follow-up is required at this time.  Rosie Fate 01/03/2022, 7:55 AM

## 2022-01-04 ENCOUNTER — Other Ambulatory Visit: Payer: Self-pay | Admitting: Family Medicine

## 2022-01-04 DIAGNOSIS — K760 Fatty (change of) liver, not elsewhere classified: Secondary | ICD-10-CM | POA: Diagnosis not present

## 2022-01-04 DIAGNOSIS — M5136 Other intervertebral disc degeneration, lumbar region: Secondary | ICD-10-CM | POA: Diagnosis not present

## 2022-01-04 DIAGNOSIS — K259 Gastric ulcer, unspecified as acute or chronic, without hemorrhage or perforation: Secondary | ICD-10-CM | POA: Diagnosis not present

## 2022-01-04 DIAGNOSIS — M4802 Spinal stenosis, cervical region: Secondary | ICD-10-CM | POA: Diagnosis not present

## 2022-01-04 DIAGNOSIS — I872 Venous insufficiency (chronic) (peripheral): Secondary | ICD-10-CM | POA: Diagnosis not present

## 2022-01-04 DIAGNOSIS — G43909 Migraine, unspecified, not intractable, without status migrainosus: Secondary | ICD-10-CM | POA: Diagnosis not present

## 2022-01-04 DIAGNOSIS — E785 Hyperlipidemia, unspecified: Secondary | ICD-10-CM | POA: Diagnosis not present

## 2022-01-04 DIAGNOSIS — G629 Polyneuropathy, unspecified: Secondary | ICD-10-CM | POA: Diagnosis not present

## 2022-01-04 DIAGNOSIS — Z993 Dependence on wheelchair: Secondary | ICD-10-CM | POA: Diagnosis not present

## 2022-01-04 DIAGNOSIS — M300 Polyarteritis nodosa: Secondary | ICD-10-CM | POA: Diagnosis not present

## 2022-01-04 DIAGNOSIS — Z9181 History of falling: Secondary | ICD-10-CM | POA: Diagnosis not present

## 2022-01-04 DIAGNOSIS — E538 Deficiency of other specified B group vitamins: Secondary | ICD-10-CM | POA: Diagnosis not present

## 2022-01-04 DIAGNOSIS — F32A Depression, unspecified: Secondary | ICD-10-CM | POA: Diagnosis not present

## 2022-01-04 DIAGNOSIS — G9529 Other cord compression: Secondary | ICD-10-CM | POA: Diagnosis not present

## 2022-01-04 DIAGNOSIS — G9589 Other specified diseases of spinal cord: Secondary | ICD-10-CM | POA: Diagnosis not present

## 2022-01-04 DIAGNOSIS — I10 Essential (primary) hypertension: Secondary | ICD-10-CM | POA: Diagnosis not present

## 2022-01-04 DIAGNOSIS — E559 Vitamin D deficiency, unspecified: Secondary | ICD-10-CM | POA: Diagnosis not present

## 2022-01-04 DIAGNOSIS — L989 Disorder of the skin and subcutaneous tissue, unspecified: Secondary | ICD-10-CM | POA: Diagnosis not present

## 2022-01-04 DIAGNOSIS — M797 Fibromyalgia: Secondary | ICD-10-CM | POA: Diagnosis not present

## 2022-01-04 DIAGNOSIS — Z87440 Personal history of urinary (tract) infections: Secondary | ICD-10-CM | POA: Diagnosis not present

## 2022-01-04 DIAGNOSIS — G4733 Obstructive sleep apnea (adult) (pediatric): Secondary | ICD-10-CM | POA: Diagnosis not present

## 2022-01-04 DIAGNOSIS — K219 Gastro-esophageal reflux disease without esophagitis: Secondary | ICD-10-CM | POA: Diagnosis not present

## 2022-01-05 NOTE — Telephone Encounter (Signed)
Please advise 

## 2022-01-06 ENCOUNTER — Telehealth: Payer: Self-pay

## 2022-01-06 DIAGNOSIS — Z87440 Personal history of urinary (tract) infections: Secondary | ICD-10-CM | POA: Diagnosis not present

## 2022-01-06 DIAGNOSIS — K219 Gastro-esophageal reflux disease without esophagitis: Secondary | ICD-10-CM | POA: Diagnosis not present

## 2022-01-06 DIAGNOSIS — M5136 Other intervertebral disc degeneration, lumbar region: Secondary | ICD-10-CM | POA: Diagnosis not present

## 2022-01-06 DIAGNOSIS — G43909 Migraine, unspecified, not intractable, without status migrainosus: Secondary | ICD-10-CM | POA: Diagnosis not present

## 2022-01-06 DIAGNOSIS — L03115 Cellulitis of right lower limb: Secondary | ICD-10-CM

## 2022-01-06 DIAGNOSIS — I10 Essential (primary) hypertension: Secondary | ICD-10-CM | POA: Diagnosis not present

## 2022-01-06 DIAGNOSIS — K259 Gastric ulcer, unspecified as acute or chronic, without hemorrhage or perforation: Secondary | ICD-10-CM | POA: Diagnosis not present

## 2022-01-06 DIAGNOSIS — E559 Vitamin D deficiency, unspecified: Secondary | ICD-10-CM | POA: Diagnosis not present

## 2022-01-06 DIAGNOSIS — Z993 Dependence on wheelchair: Secondary | ICD-10-CM | POA: Diagnosis not present

## 2022-01-06 DIAGNOSIS — R29898 Other symptoms and signs involving the musculoskeletal system: Secondary | ICD-10-CM

## 2022-01-06 DIAGNOSIS — G629 Polyneuropathy, unspecified: Secondary | ICD-10-CM | POA: Diagnosis not present

## 2022-01-06 DIAGNOSIS — M300 Polyarteritis nodosa: Secondary | ICD-10-CM | POA: Diagnosis not present

## 2022-01-06 DIAGNOSIS — E785 Hyperlipidemia, unspecified: Secondary | ICD-10-CM | POA: Diagnosis not present

## 2022-01-06 DIAGNOSIS — M4802 Spinal stenosis, cervical region: Secondary | ICD-10-CM | POA: Diagnosis not present

## 2022-01-06 DIAGNOSIS — M797 Fibromyalgia: Secondary | ICD-10-CM | POA: Diagnosis not present

## 2022-01-06 DIAGNOSIS — E538 Deficiency of other specified B group vitamins: Secondary | ICD-10-CM | POA: Diagnosis not present

## 2022-01-06 DIAGNOSIS — L989 Disorder of the skin and subcutaneous tissue, unspecified: Secondary | ICD-10-CM | POA: Diagnosis not present

## 2022-01-06 DIAGNOSIS — F32A Depression, unspecified: Secondary | ICD-10-CM | POA: Diagnosis not present

## 2022-01-06 DIAGNOSIS — G9589 Other specified diseases of spinal cord: Secondary | ICD-10-CM

## 2022-01-06 DIAGNOSIS — Z9181 History of falling: Secondary | ICD-10-CM | POA: Diagnosis not present

## 2022-01-06 DIAGNOSIS — I872 Venous insufficiency (chronic) (peripheral): Secondary | ICD-10-CM | POA: Diagnosis not present

## 2022-01-06 DIAGNOSIS — G9529 Other cord compression: Secondary | ICD-10-CM | POA: Diagnosis not present

## 2022-01-06 DIAGNOSIS — K760 Fatty (change of) liver, not elsewhere classified: Secondary | ICD-10-CM | POA: Diagnosis not present

## 2022-01-06 DIAGNOSIS — G4733 Obstructive sleep apnea (adult) (pediatric): Secondary | ICD-10-CM | POA: Diagnosis not present

## 2022-01-06 NOTE — Telephone Encounter (Signed)
Home health PT referral for electric scooter eval placed

## 2022-01-07 ENCOUNTER — Telehealth: Payer: Self-pay | Admitting: Family Medicine

## 2022-01-07 ENCOUNTER — Other Ambulatory Visit: Payer: Self-pay | Admitting: Family Medicine

## 2022-01-07 DIAGNOSIS — K760 Fatty (change of) liver, not elsewhere classified: Secondary | ICD-10-CM | POA: Diagnosis not present

## 2022-01-07 DIAGNOSIS — M5136 Other intervertebral disc degeneration, lumbar region: Secondary | ICD-10-CM | POA: Diagnosis not present

## 2022-01-07 DIAGNOSIS — I10 Essential (primary) hypertension: Secondary | ICD-10-CM | POA: Diagnosis not present

## 2022-01-07 DIAGNOSIS — K259 Gastric ulcer, unspecified as acute or chronic, without hemorrhage or perforation: Secondary | ICD-10-CM | POA: Diagnosis not present

## 2022-01-07 DIAGNOSIS — A419 Sepsis, unspecified organism: Secondary | ICD-10-CM | POA: Diagnosis not present

## 2022-01-07 DIAGNOSIS — G9589 Other specified diseases of spinal cord: Secondary | ICD-10-CM | POA: Diagnosis not present

## 2022-01-07 DIAGNOSIS — E871 Hypo-osmolality and hyponatremia: Secondary | ICD-10-CM | POA: Diagnosis not present

## 2022-01-07 DIAGNOSIS — M300 Polyarteritis nodosa: Secondary | ICD-10-CM | POA: Diagnosis not present

## 2022-01-07 DIAGNOSIS — F32A Depression, unspecified: Secondary | ICD-10-CM | POA: Diagnosis not present

## 2022-01-07 DIAGNOSIS — E785 Hyperlipidemia, unspecified: Secondary | ICD-10-CM | POA: Diagnosis not present

## 2022-01-07 DIAGNOSIS — I872 Venous insufficiency (chronic) (peripheral): Secondary | ICD-10-CM | POA: Diagnosis not present

## 2022-01-07 DIAGNOSIS — E559 Vitamin D deficiency, unspecified: Secondary | ICD-10-CM | POA: Diagnosis not present

## 2022-01-07 DIAGNOSIS — Z9181 History of falling: Secondary | ICD-10-CM | POA: Diagnosis not present

## 2022-01-07 DIAGNOSIS — Z993 Dependence on wheelchair: Secondary | ICD-10-CM | POA: Diagnosis not present

## 2022-01-07 DIAGNOSIS — G629 Polyneuropathy, unspecified: Secondary | ICD-10-CM | POA: Diagnosis not present

## 2022-01-07 DIAGNOSIS — G43909 Migraine, unspecified, not intractable, without status migrainosus: Secondary | ICD-10-CM | POA: Diagnosis not present

## 2022-01-07 DIAGNOSIS — G9529 Other cord compression: Secondary | ICD-10-CM | POA: Diagnosis not present

## 2022-01-07 DIAGNOSIS — E538 Deficiency of other specified B group vitamins: Secondary | ICD-10-CM | POA: Diagnosis not present

## 2022-01-07 DIAGNOSIS — M4802 Spinal stenosis, cervical region: Secondary | ICD-10-CM | POA: Diagnosis not present

## 2022-01-07 DIAGNOSIS — G4733 Obstructive sleep apnea (adult) (pediatric): Secondary | ICD-10-CM | POA: Diagnosis not present

## 2022-01-07 DIAGNOSIS — K219 Gastro-esophageal reflux disease without esophagitis: Secondary | ICD-10-CM | POA: Diagnosis not present

## 2022-01-07 DIAGNOSIS — M797 Fibromyalgia: Secondary | ICD-10-CM | POA: Diagnosis not present

## 2022-01-07 DIAGNOSIS — Z87440 Personal history of urinary (tract) infections: Secondary | ICD-10-CM | POA: Diagnosis not present

## 2022-01-07 DIAGNOSIS — L989 Disorder of the skin and subcutaneous tissue, unspecified: Secondary | ICD-10-CM | POA: Diagnosis not present

## 2022-01-10 DIAGNOSIS — K760 Fatty (change of) liver, not elsewhere classified: Secondary | ICD-10-CM | POA: Diagnosis not present

## 2022-01-10 DIAGNOSIS — G9529 Other cord compression: Secondary | ICD-10-CM | POA: Diagnosis not present

## 2022-01-10 DIAGNOSIS — M5136 Other intervertebral disc degeneration, lumbar region: Secondary | ICD-10-CM | POA: Diagnosis not present

## 2022-01-10 DIAGNOSIS — Z993 Dependence on wheelchair: Secondary | ICD-10-CM | POA: Diagnosis not present

## 2022-01-10 DIAGNOSIS — E785 Hyperlipidemia, unspecified: Secondary | ICD-10-CM | POA: Diagnosis not present

## 2022-01-10 DIAGNOSIS — F32A Depression, unspecified: Secondary | ICD-10-CM | POA: Diagnosis not present

## 2022-01-10 DIAGNOSIS — G4733 Obstructive sleep apnea (adult) (pediatric): Secondary | ICD-10-CM | POA: Diagnosis not present

## 2022-01-10 DIAGNOSIS — M4802 Spinal stenosis, cervical region: Secondary | ICD-10-CM | POA: Diagnosis not present

## 2022-01-10 DIAGNOSIS — E538 Deficiency of other specified B group vitamins: Secondary | ICD-10-CM | POA: Diagnosis not present

## 2022-01-10 DIAGNOSIS — G9589 Other specified diseases of spinal cord: Secondary | ICD-10-CM | POA: Diagnosis not present

## 2022-01-10 DIAGNOSIS — I10 Essential (primary) hypertension: Secondary | ICD-10-CM | POA: Diagnosis not present

## 2022-01-10 DIAGNOSIS — Z9181 History of falling: Secondary | ICD-10-CM | POA: Diagnosis not present

## 2022-01-10 DIAGNOSIS — L989 Disorder of the skin and subcutaneous tissue, unspecified: Secondary | ICD-10-CM | POA: Diagnosis not present

## 2022-01-10 DIAGNOSIS — E559 Vitamin D deficiency, unspecified: Secondary | ICD-10-CM | POA: Diagnosis not present

## 2022-01-10 DIAGNOSIS — M797 Fibromyalgia: Secondary | ICD-10-CM | POA: Diagnosis not present

## 2022-01-10 DIAGNOSIS — K259 Gastric ulcer, unspecified as acute or chronic, without hemorrhage or perforation: Secondary | ICD-10-CM | POA: Diagnosis not present

## 2022-01-10 DIAGNOSIS — G629 Polyneuropathy, unspecified: Secondary | ICD-10-CM | POA: Diagnosis not present

## 2022-01-10 DIAGNOSIS — M300 Polyarteritis nodosa: Secondary | ICD-10-CM | POA: Diagnosis not present

## 2022-01-10 DIAGNOSIS — I872 Venous insufficiency (chronic) (peripheral): Secondary | ICD-10-CM | POA: Diagnosis not present

## 2022-01-10 DIAGNOSIS — Z87440 Personal history of urinary (tract) infections: Secondary | ICD-10-CM | POA: Diagnosis not present

## 2022-01-10 DIAGNOSIS — G43909 Migraine, unspecified, not intractable, without status migrainosus: Secondary | ICD-10-CM | POA: Diagnosis not present

## 2022-01-10 DIAGNOSIS — K219 Gastro-esophageal reflux disease without esophagitis: Secondary | ICD-10-CM | POA: Diagnosis not present

## 2022-01-11 ENCOUNTER — Ambulatory Visit: Payer: Medicare Other | Admitting: Neurology

## 2022-01-11 DIAGNOSIS — M300 Polyarteritis nodosa: Secondary | ICD-10-CM | POA: Diagnosis not present

## 2022-01-11 DIAGNOSIS — E538 Deficiency of other specified B group vitamins: Secondary | ICD-10-CM | POA: Diagnosis not present

## 2022-01-11 DIAGNOSIS — M4802 Spinal stenosis, cervical region: Secondary | ICD-10-CM | POA: Diagnosis not present

## 2022-01-11 DIAGNOSIS — E559 Vitamin D deficiency, unspecified: Secondary | ICD-10-CM | POA: Diagnosis not present

## 2022-01-11 DIAGNOSIS — M797 Fibromyalgia: Secondary | ICD-10-CM | POA: Diagnosis not present

## 2022-01-11 DIAGNOSIS — I872 Venous insufficiency (chronic) (peripheral): Secondary | ICD-10-CM | POA: Diagnosis not present

## 2022-01-11 DIAGNOSIS — G9589 Other specified diseases of spinal cord: Secondary | ICD-10-CM | POA: Diagnosis not present

## 2022-01-11 DIAGNOSIS — G629 Polyneuropathy, unspecified: Secondary | ICD-10-CM | POA: Diagnosis not present

## 2022-01-11 DIAGNOSIS — K219 Gastro-esophageal reflux disease without esophagitis: Secondary | ICD-10-CM | POA: Diagnosis not present

## 2022-01-11 DIAGNOSIS — M5136 Other intervertebral disc degeneration, lumbar region: Secondary | ICD-10-CM | POA: Diagnosis not present

## 2022-01-11 DIAGNOSIS — Z9181 History of falling: Secondary | ICD-10-CM | POA: Diagnosis not present

## 2022-01-11 DIAGNOSIS — F32A Depression, unspecified: Secondary | ICD-10-CM | POA: Diagnosis not present

## 2022-01-11 DIAGNOSIS — K760 Fatty (change of) liver, not elsewhere classified: Secondary | ICD-10-CM | POA: Diagnosis not present

## 2022-01-11 DIAGNOSIS — G4733 Obstructive sleep apnea (adult) (pediatric): Secondary | ICD-10-CM | POA: Diagnosis not present

## 2022-01-11 DIAGNOSIS — L989 Disorder of the skin and subcutaneous tissue, unspecified: Secondary | ICD-10-CM | POA: Diagnosis not present

## 2022-01-11 DIAGNOSIS — K259 Gastric ulcer, unspecified as acute or chronic, without hemorrhage or perforation: Secondary | ICD-10-CM | POA: Diagnosis not present

## 2022-01-11 DIAGNOSIS — E785 Hyperlipidemia, unspecified: Secondary | ICD-10-CM | POA: Diagnosis not present

## 2022-01-11 DIAGNOSIS — G9529 Other cord compression: Secondary | ICD-10-CM | POA: Diagnosis not present

## 2022-01-11 DIAGNOSIS — I10 Essential (primary) hypertension: Secondary | ICD-10-CM | POA: Diagnosis not present

## 2022-01-11 DIAGNOSIS — Z993 Dependence on wheelchair: Secondary | ICD-10-CM | POA: Diagnosis not present

## 2022-01-11 DIAGNOSIS — Z87440 Personal history of urinary (tract) infections: Secondary | ICD-10-CM | POA: Diagnosis not present

## 2022-01-11 DIAGNOSIS — G43909 Migraine, unspecified, not intractable, without status migrainosus: Secondary | ICD-10-CM | POA: Diagnosis not present

## 2022-01-12 ENCOUNTER — Other Ambulatory Visit: Payer: Self-pay | Admitting: Family Medicine

## 2022-01-12 ENCOUNTER — Encounter: Payer: Self-pay | Admitting: Family Medicine

## 2022-01-12 ENCOUNTER — Ambulatory Visit (INDEPENDENT_AMBULATORY_CARE_PROVIDER_SITE_OTHER): Payer: Medicare Other | Admitting: Family Medicine

## 2022-01-12 VITALS — BP 130/76 | HR 74 | Temp 98.4°F | Resp 16 | Ht 62.5 in | Wt 275.0 lb

## 2022-01-12 DIAGNOSIS — Z23 Encounter for immunization: Secondary | ICD-10-CM | POA: Diagnosis not present

## 2022-01-12 DIAGNOSIS — E531 Pyridoxine deficiency: Secondary | ICD-10-CM

## 2022-01-12 DIAGNOSIS — E538 Deficiency of other specified B group vitamins: Secondary | ICD-10-CM

## 2022-01-12 DIAGNOSIS — R252 Cramp and spasm: Secondary | ICD-10-CM | POA: Insufficient documentation

## 2022-01-12 DIAGNOSIS — H6122 Impacted cerumen, left ear: Secondary | ICD-10-CM

## 2022-01-12 NOTE — Progress Notes (Unsigned)
   Subjective:    Patient ID: [name], female    DOB: April 13, 1971, 51 y.o.   MRN: [id number]  HPI Bilateral leg cramps- pt reports that she has to take 4 tabs of B12 to prevent leg cramping.  Otherwise legs will cramp and she feels generally 'weaker'.    L ear fullness- pt reports L ear is full and 'i can't hear out of it'.  No drainage drom the ear.  Sxs started 2 days ago.  R ear is fine.   Review of Systems For ROS see HPI     Objective:   Physical Exam Vitals reviewed.  Constitutional:      General: She is not in acute distress.    Appearance: She is obese.     Comments: Sitting in wheelchair  HENT:     Head: Normocephalic and atraumatic.     Right Ear: Tympanic membrane and ear canal normal.     Ears:     Comments: L TM obscured by wax.  Pt consented to removal.  Wax successfully irrigated w/ immediate relief of pain and hearing loss.  TM WNL Eyes:     Extraocular Movements: Extraocular movements intact.     Conjunctiva/sclera: Conjunctivae normal.     Pupils: Pupils are equal, round, and reactive to light.  Cardiovascular:     Rate and Rhythm: Normal rate and regular rhythm.     Heart sounds: Normal heart sounds.  Pulmonary:     Effort: Pulmonary effort is normal. No respiratory distress.     Breath sounds: Normal breath sounds. No wheezing or rhonchi.  Skin:    General: Skin is warm and dry.  Neurological:     Mental Status: She is alert and oriented to person, place, and time.  Psychiatric:        Mood and Affect: Mood normal.        Behavior: Behavior normal.        Thought Content: Thought content normal.           Assessment & Plan:   Leg cramps- pt has hx of similar.  Given hx of vitamin deficiencies and electrolyte abnormalities, will check labs to assess for underlying cause.  Also stressed need for adequate water intake- which pt tends to limit as she is bed bound.  Pt expressed understanding and is in agreement w/ plan.   Cerumen impaction w/  hearing loss- new.  L side only.  Pt consented to and tolerated irrigation w/o difficulty and had immediate relief of sxs.

## 2022-01-12 NOTE — Patient Instructions (Signed)
Follow up as needed or as scheduled We'll notify you of your lab results and make any changes if needed Make sure you are drinking LOTS of fluids to help avoid leg cramps Call with any questions or concerns Hang in there!

## 2022-01-13 ENCOUNTER — Ambulatory Visit: Admit: 2022-01-13 | Payer: MEDICARE

## 2022-01-13 ENCOUNTER — Telehealth: Payer: Self-pay

## 2022-01-13 DIAGNOSIS — K219 Gastro-esophageal reflux disease without esophagitis: Secondary | ICD-10-CM | POA: Diagnosis not present

## 2022-01-13 DIAGNOSIS — F32A Depression, unspecified: Secondary | ICD-10-CM | POA: Diagnosis not present

## 2022-01-13 DIAGNOSIS — K760 Fatty (change of) liver, not elsewhere classified: Secondary | ICD-10-CM | POA: Diagnosis not present

## 2022-01-13 DIAGNOSIS — L989 Disorder of the skin and subcutaneous tissue, unspecified: Secondary | ICD-10-CM | POA: Diagnosis not present

## 2022-01-13 DIAGNOSIS — G9589 Other specified diseases of spinal cord: Secondary | ICD-10-CM | POA: Diagnosis not present

## 2022-01-13 DIAGNOSIS — K259 Gastric ulcer, unspecified as acute or chronic, without hemorrhage or perforation: Secondary | ICD-10-CM | POA: Diagnosis not present

## 2022-01-13 DIAGNOSIS — M797 Fibromyalgia: Secondary | ICD-10-CM | POA: Diagnosis not present

## 2022-01-13 DIAGNOSIS — I10 Essential (primary) hypertension: Secondary | ICD-10-CM | POA: Diagnosis not present

## 2022-01-13 DIAGNOSIS — Z9181 History of falling: Secondary | ICD-10-CM | POA: Diagnosis not present

## 2022-01-13 DIAGNOSIS — E559 Vitamin D deficiency, unspecified: Secondary | ICD-10-CM | POA: Diagnosis not present

## 2022-01-13 DIAGNOSIS — Z87440 Personal history of urinary (tract) infections: Secondary | ICD-10-CM | POA: Diagnosis not present

## 2022-01-13 DIAGNOSIS — M4802 Spinal stenosis, cervical region: Secondary | ICD-10-CM | POA: Diagnosis not present

## 2022-01-13 DIAGNOSIS — I872 Venous insufficiency (chronic) (peripheral): Secondary | ICD-10-CM | POA: Diagnosis not present

## 2022-01-13 DIAGNOSIS — E538 Deficiency of other specified B group vitamins: Secondary | ICD-10-CM | POA: Diagnosis not present

## 2022-01-13 DIAGNOSIS — M5136 Other intervertebral disc degeneration, lumbar region: Secondary | ICD-10-CM | POA: Diagnosis not present

## 2022-01-13 DIAGNOSIS — Z993 Dependence on wheelchair: Secondary | ICD-10-CM | POA: Diagnosis not present

## 2022-01-13 DIAGNOSIS — M300 Polyarteritis nodosa: Secondary | ICD-10-CM | POA: Diagnosis not present

## 2022-01-13 DIAGNOSIS — G4733 Obstructive sleep apnea (adult) (pediatric): Secondary | ICD-10-CM | POA: Diagnosis not present

## 2022-01-13 DIAGNOSIS — E785 Hyperlipidemia, unspecified: Secondary | ICD-10-CM | POA: Diagnosis not present

## 2022-01-13 DIAGNOSIS — G43909 Migraine, unspecified, not intractable, without status migrainosus: Secondary | ICD-10-CM | POA: Diagnosis not present

## 2022-01-13 DIAGNOSIS — G9529 Other cord compression: Secondary | ICD-10-CM | POA: Diagnosis not present

## 2022-01-13 DIAGNOSIS — G629 Polyneuropathy, unspecified: Secondary | ICD-10-CM | POA: Diagnosis not present

## 2022-01-13 LAB — CBC WITH DIFFERENTIAL/PLATELET
Basophils Absolute: 0.1 10*3/uL (ref 0.0–0.1)
Basophils Relative: 1.2 % (ref 0.0–3.0)
Eosinophils Absolute: 0.5 10*3/uL (ref 0.0–0.7)
Eosinophils Relative: 6.9 % — ABNORMAL HIGH (ref 0.0–5.0)
HCT: 39.8 % (ref 36.0–46.0)
Hemoglobin: 13.2 g/dL (ref 12.0–15.0)
Lymphocytes Relative: 17.5 % (ref 12.0–46.0)
Lymphs Abs: 1.3 10*3/uL (ref 0.7–4.0)
MCHC: 33.1 g/dL (ref 30.0–36.0)
MCV: 90.6 fl (ref 78.0–100.0)
Monocytes Absolute: 0.5 10*3/uL (ref 0.1–1.0)
Monocytes Relative: 6 % (ref 3.0–12.0)
Neutro Abs: 5.1 10*3/uL (ref 1.4–7.7)
Neutrophils Relative %: 68.4 % (ref 43.0–77.0)
Platelets: 294 10*3/uL (ref 150.0–400.0)
RBC: 4.39 Mil/uL (ref 3.87–5.11)
RDW: 14.3 % (ref 11.5–15.5)
WBC: 7.5 10*3/uL (ref 4.0–10.5)

## 2022-01-13 LAB — HEPATIC FUNCTION PANEL
ALT: 20 U/L (ref 0–35)
AST: 24 U/L (ref 0–37)
Albumin: 3.9 g/dL (ref 3.5–5.2)
Alkaline Phosphatase: 83 U/L (ref 39–117)
Bilirubin, Direct: 0.1 mg/dL (ref 0.0–0.3)
Total Bilirubin: 0.3 mg/dL (ref 0.2–1.2)
Total Protein: 7.3 g/dL (ref 6.0–8.3)

## 2022-01-13 LAB — BASIC METABOLIC PANEL
BUN: 7 mg/dL (ref 6–23)
CO2: 30 mEq/L (ref 19–32)
Calcium: 9.1 mg/dL (ref 8.4–10.5)
Chloride: 100 mEq/L (ref 96–112)
Creatinine, Ser: 0.55 mg/dL (ref 0.40–1.20)
GFR: 105.62 mL/min (ref >60.00)
Glucose, Bld: 79 mg/dL (ref 70–99)
Potassium: 4.4 mEq/L (ref 3.5–5.1)
Sodium: 138 mEq/L (ref 135–145)

## 2022-01-13 LAB — B12 AND FOLATE PANEL
Folate: >23.9 ng/mL (ref >5.9)
Vitamin B-12: 1300 pg/mL — ABNORMAL HIGH (ref 211–911)

## 2022-01-13 LAB — TSH: TSH: 2.14 u[IU]/mL (ref 0.35–5.50)

## 2022-01-13 NOTE — Unmapped (Signed)
Patient did not show for their rheumatology appointment today.

## 2022-01-13 NOTE — Unmapped (Signed)
[facility] Clinical Assessment & Refill Coordination Note    Autumn Mcdonald, DOB: 1970/05/28  Phone: [phone] (home)     All above HIPAA information was verified with patient.     Was a Nurse, learning disability used for this call? No    Specialty Medication(s):   Inflammatory Disorders: Skyrizi     Current Outpatient Medications   Medication Sig Dispense Refill    ALPRAZolam (XANAX) 0.5 MG tablet Take 0.5 mg by mouth.      ARIPiprazole (ABILIFY) 10 MG tablet Take 10 mg by mouth daily.      armodafiniL (NUVIGIL) 250 mg tablet Take 250 mg by mouth three (3) times a day.       biotin 10 mg Tab 10 mg per 1 tablet, ORAL, Daily, 0 Refill(s)      buPROPion (WELLBUTRIN XL) 150 MG 24 hr tablet Take 150 mg by mouth.      busPIRone (BUSPAR) 30 MG tablet Take 30 mg by mouth.      calcium citrate (CALCITRATE) 200 mg elem calcium (950 mg) tablet 950 mg per 1 tablet, ORAL, Daily, 60 tablet, 0 Refill(s)      clonazePAM (KLONOPIN) 1 MG tablet TAKE 1 TABLET BY MOUTH EVERY DAY      cyclobenzaprine (FLEXERIL) 10 MG tablet Take 10 mg by mouth once as needed.       diclofenac sodium (VOLTAREN) 1 % gel Apply 2 g topically four (4) times a day. 300 g 5    DULoxetine (CYMBALTA) 60 MG capsule Take 60 mg by mouth daily. 120 mg daily      ferrous sulfate 325 (65 FE) MG tablet Take 1 tablet (325 mg total) by mouth daily with breakfast.  6    furosemide (LASIX) 80 MG tablet 20 mg.      gabapentin (NEURONTIN) 600 MG tablet Take 1 tablet (600 mg total) by mouth Three (3) times a day. 270 tablet 1    HYDROcodone-acetaminophen (NORCO 10-325) 10-325 mg per tablet Take 1 tablet by mouth.      MEDICAL SUPPLY ITEM Pneumatic pump for Lymphedema  [employer]   Attn: Bartholomew Crews, Energy manager   Fax: [phone] 1 each 0    nebivoloL (BYSTOLIC) 10 MG tablet Take 10 mg by mouth daily.       NYSTOP 100,000 unit/gram powder As needed      omeprazole (PRILOSEC) 20 MG capsule Take 1 capsule by mouth daily. OXcarbazepine (TRILEPTAL) 300 MG tablet Take 300 mg by mouth.      pantoprazole (PROTONIX) 40 MG tablet Take 40 mg by mouth Two (2) times a day. bid      pentoxifylline (TRENTAL) 400 mg CR tablet Take 1 tablet (400 mg total) by mouth Three (3) times a day with a meal. 270 tablet 3    promethazine (PHENERGAN) 25 MG tablet once as needed.       risankizumab-rzaa (SKYRIZI) 150 mg/mL PnIj Inject the contents of 1 pen (150 mg) under the skin every 12 weeks. 1 mL 3    risankizumab-rzaa (SKYRIZI) 150 mg/mL Syrg Inject the contents of 1 syringe (150 mg) under the skin every 12 weeks. 1 mL 3    rosuvastatin (CRESTOR) 20 MG tablet TAKE 1 TABLET BY MOUTH EVERY DAY       No current facility-administered medications for this visit.        Changes to medications: Cornelious reports no changes at this time.    Allergies   Allergen Reactions  Iohexol Hives     Pt treated with PO benedryl    Keflex [Cephalexin] Shortness Of Breath and Hives    Lincomycin Hcl Hives    Lisinopril Hives     Hives  Hives    Sulfa (Sulfonamide Antibiotics) Swelling and Rash    Sulfamethoxazole-Trimethoprim Hives     REACTION: Rash, throat closed, eyes swelled.  Hives  Hives    Aspirin      REACTION: Hives  High Doses only. Causes rash.  High Doses only. Causes rash.    Naproxen      REACTION: Hives  Eyes well  Eyes well    Niacin      REACTION: Swelling, problems breathing    Benzoin Compound Rash    Pneumococcal 23-Val Ps Vaccine Rash     cellulitis       Changes to allergies: No    SPECIALTY MEDICATION ADHERENCE     Skyrizi 150 mg/ml: 0 days of medicine on hand     Medication Adherence    Patient reported X missed doses in the last month: 0  Specialty Medication: Skyrizi  Patient is on additional specialty medications: No  Informant: patient                            Specialty medication(s) dose(s) confirmed: Regimen is correct and unchanged.     Are there any concerns with adherence? No    Adherence counseling provided? Not needed    CLINICAL MANAGEMENT AND INTERVENTION      Clinical Benefit Assessment:    Do you feel the medicine is effective or helping your condition? Yes    Clinical Benefit counseling provided? Not needed    Adverse Effects Assessment:    Are you experiencing any side effects? No    Are you experiencing difficulty administering your medicine? No    Quality of Life Assessment:    Quality of Life    Rheumatology  Oncology  Dermatology  Cystic Fibrosis          How many days over the past month did your psoriasis  keep you from your normal activities? For example, brushing your teeth or getting up in the morning. 0    Have you discussed this with your provider? Not needed    Acute Infection Status:    Acute infections noted within Epic:  No active infections  Patient reported infection: None    Therapy Appropriateness:    Is therapy appropriate and patient progressing towards therapeutic goals? Yes, therapy is appropriate and should be continued    DISEASE/MEDICATION-SPECIFIC INFORMATION      For patients on injectable medications: Patient currently has 0 doses left.  Next injection is scheduled for 09/06/3.    PATIENT SPECIFIC NEEDS     Does the patient have any physical, cognitive, or cultural barriers? No    Is the patient high risk? No    Does the patient require a Care Management Plan? No     SOCIAL DETERMINANTS OF HEALTH     At the [facility], we have learned that life circumstances - like trouble affording food, housing, utilities, or transportation can affect the health of many of our patients.   That is why we wanted to ask: are you currently experiencing any life circumstances that are negatively impacting your health and/or quality of life? Patient declined to answer    Social Determinants of Health     Financial Resource Strain:  Not on file   Internet Connectivity: Not on file   Food Insecurity: Not on file   Tobacco Use: Low Risk  (07/26/2021)    Patient History     Smoking Tobacco Use: Never     Smokeless Tobacco Use: Never Passive Exposure: Not on file   Housing/Utilities: Not on file   Alcohol Use: Not on file   Transportation Needs: Not on file   Substance Use: Not on file   Health Literacy: Not on file   Physical Activity: Not on file   Interpersonal Safety: Not on file   Stress: Not on file   Intimate Partner Violence: Not on file   Depression: Not on file   Social Connections: Not on file       Would you be willing to receive help with any of the needs that you have identified today? Not applicable       SHIPPING     Specialty Medication(s) to be Shipped:   Inflammatory Disorders: Skyrizi    Other medication(s) to be shipped: No additional medications requested for fill at this time     Changes to insurance: No    Delivery Scheduled: Yes, Expected medication delivery date: 01/19/22.     Medication will be delivered via UPS to the confirmed prescription address in [facility].    The patient will receive a drug information handout for each medication shipped and additional FDA Medication Guides as required.  Verified that patient has previously received a Conservation officer, historic buildings and a Surveyor, mining.    The patient or caregiver noted above participated in the development of this care plan and knows that they can request review of or adjustments to the care plan at any time.      All of the patient's questions and concerns have been addressed.    Arnold Long   [facility] Specialty Pharmacist

## 2022-01-13 NOTE — Telephone Encounter (Signed)
Informed pt of lab results  

## 2022-01-13 NOTE — Telephone Encounter (Signed)
-----   Message from [name], MD sent at 01/13/2022 12:49 PM EDT ----- Labs look good!  No obvious cause of leg cramps

## 2022-01-14 DIAGNOSIS — M5136 Other intervertebral disc degeneration, lumbar region: Secondary | ICD-10-CM | POA: Diagnosis not present

## 2022-01-14 DIAGNOSIS — L989 Disorder of the skin and subcutaneous tissue, unspecified: Secondary | ICD-10-CM | POA: Diagnosis not present

## 2022-01-14 DIAGNOSIS — I10 Essential (primary) hypertension: Secondary | ICD-10-CM | POA: Diagnosis not present

## 2022-01-14 DIAGNOSIS — E785 Hyperlipidemia, unspecified: Secondary | ICD-10-CM | POA: Diagnosis not present

## 2022-01-14 DIAGNOSIS — M4802 Spinal stenosis, cervical region: Secondary | ICD-10-CM | POA: Diagnosis not present

## 2022-01-14 DIAGNOSIS — G43909 Migraine, unspecified, not intractable, without status migrainosus: Secondary | ICD-10-CM | POA: Diagnosis not present

## 2022-01-14 DIAGNOSIS — F32A Depression, unspecified: Secondary | ICD-10-CM | POA: Diagnosis not present

## 2022-01-14 DIAGNOSIS — Z993 Dependence on wheelchair: Secondary | ICD-10-CM | POA: Diagnosis not present

## 2022-01-14 DIAGNOSIS — E538 Deficiency of other specified B group vitamins: Secondary | ICD-10-CM | POA: Diagnosis not present

## 2022-01-14 DIAGNOSIS — G629 Polyneuropathy, unspecified: Secondary | ICD-10-CM | POA: Diagnosis not present

## 2022-01-14 DIAGNOSIS — G9529 Other cord compression: Secondary | ICD-10-CM | POA: Diagnosis not present

## 2022-01-14 DIAGNOSIS — M300 Polyarteritis nodosa: Secondary | ICD-10-CM | POA: Diagnosis not present

## 2022-01-14 DIAGNOSIS — K259 Gastric ulcer, unspecified as acute or chronic, without hemorrhage or perforation: Secondary | ICD-10-CM | POA: Diagnosis not present

## 2022-01-14 DIAGNOSIS — G4733 Obstructive sleep apnea (adult) (pediatric): Secondary | ICD-10-CM | POA: Diagnosis not present

## 2022-01-14 DIAGNOSIS — K760 Fatty (change of) liver, not elsewhere classified: Secondary | ICD-10-CM | POA: Diagnosis not present

## 2022-01-14 DIAGNOSIS — Z87440 Personal history of urinary (tract) infections: Secondary | ICD-10-CM | POA: Diagnosis not present

## 2022-01-14 DIAGNOSIS — Z9181 History of falling: Secondary | ICD-10-CM | POA: Diagnosis not present

## 2022-01-14 DIAGNOSIS — M797 Fibromyalgia: Secondary | ICD-10-CM | POA: Diagnosis not present

## 2022-01-14 DIAGNOSIS — E559 Vitamin D deficiency, unspecified: Secondary | ICD-10-CM | POA: Diagnosis not present

## 2022-01-14 DIAGNOSIS — K219 Gastro-esophageal reflux disease without esophagitis: Secondary | ICD-10-CM | POA: Diagnosis not present

## 2022-01-14 DIAGNOSIS — G9589 Other specified diseases of spinal cord: Secondary | ICD-10-CM | POA: Diagnosis not present

## 2022-01-14 DIAGNOSIS — I872 Venous insufficiency (chronic) (peripheral): Secondary | ICD-10-CM | POA: Diagnosis not present

## 2022-01-17 LAB — VITAMIN B6: Vitamin B6: 63.6 ng/mL — ABNORMAL HIGH (ref 2.1–21.7)

## 2022-01-18 ENCOUNTER — Telehealth: Payer: Self-pay | Admitting: Family Medicine

## 2022-01-18 DIAGNOSIS — G43909 Migraine, unspecified, not intractable, without status migrainosus: Secondary | ICD-10-CM | POA: Diagnosis not present

## 2022-01-18 DIAGNOSIS — M5136 Other intervertebral disc degeneration, lumbar region: Secondary | ICD-10-CM | POA: Diagnosis not present

## 2022-01-18 DIAGNOSIS — K219 Gastro-esophageal reflux disease without esophagitis: Secondary | ICD-10-CM | POA: Diagnosis not present

## 2022-01-18 DIAGNOSIS — K760 Fatty (change of) liver, not elsewhere classified: Secondary | ICD-10-CM | POA: Diagnosis not present

## 2022-01-18 DIAGNOSIS — Z993 Dependence on wheelchair: Secondary | ICD-10-CM | POA: Diagnosis not present

## 2022-01-18 DIAGNOSIS — Z87440 Personal history of urinary (tract) infections: Secondary | ICD-10-CM | POA: Diagnosis not present

## 2022-01-18 DIAGNOSIS — M797 Fibromyalgia: Secondary | ICD-10-CM | POA: Diagnosis not present

## 2022-01-18 DIAGNOSIS — M4802 Spinal stenosis, cervical region: Secondary | ICD-10-CM | POA: Diagnosis not present

## 2022-01-18 DIAGNOSIS — K259 Gastric ulcer, unspecified as acute or chronic, without hemorrhage or perforation: Secondary | ICD-10-CM | POA: Diagnosis not present

## 2022-01-18 DIAGNOSIS — M300 Polyarteritis nodosa: Secondary | ICD-10-CM | POA: Diagnosis not present

## 2022-01-18 DIAGNOSIS — G9529 Other cord compression: Secondary | ICD-10-CM | POA: Diagnosis not present

## 2022-01-18 DIAGNOSIS — E785 Hyperlipidemia, unspecified: Secondary | ICD-10-CM | POA: Diagnosis not present

## 2022-01-18 DIAGNOSIS — G9589 Other specified diseases of spinal cord: Secondary | ICD-10-CM | POA: Diagnosis not present

## 2022-01-18 DIAGNOSIS — E538 Deficiency of other specified B group vitamins: Secondary | ICD-10-CM | POA: Diagnosis not present

## 2022-01-18 DIAGNOSIS — F32A Depression, unspecified: Secondary | ICD-10-CM | POA: Diagnosis not present

## 2022-01-18 DIAGNOSIS — I10 Essential (primary) hypertension: Secondary | ICD-10-CM | POA: Diagnosis not present

## 2022-01-18 DIAGNOSIS — L989 Disorder of the skin and subcutaneous tissue, unspecified: Secondary | ICD-10-CM | POA: Diagnosis not present

## 2022-01-18 DIAGNOSIS — G4733 Obstructive sleep apnea (adult) (pediatric): Secondary | ICD-10-CM | POA: Diagnosis not present

## 2022-01-18 DIAGNOSIS — E559 Vitamin D deficiency, unspecified: Secondary | ICD-10-CM | POA: Diagnosis not present

## 2022-01-18 DIAGNOSIS — G629 Polyneuropathy, unspecified: Secondary | ICD-10-CM | POA: Diagnosis not present

## 2022-01-18 DIAGNOSIS — Z9181 History of falling: Secondary | ICD-10-CM | POA: Diagnosis not present

## 2022-01-18 DIAGNOSIS — I872 Venous insufficiency (chronic) (peripheral): Secondary | ICD-10-CM | POA: Diagnosis not present

## 2022-01-18 MED FILL — SKYRIZI 150 MG/ML SUBCUTANEOUS SYRINGE: 84 days supply | Qty: 1 | Fill #1

## 2022-01-18 NOTE — Progress Notes (Signed)
Pt seen results Via my chart  

## 2022-01-18 NOTE — Telephone Encounter (Signed)
Pt called in asking for the face to face to be faxed in for the scooter phone # [phone]  And pt also needs a cologuard order sent in   Please advise

## 2022-01-18 NOTE — Telephone Encounter (Signed)
error 

## 2022-01-19 ENCOUNTER — Other Ambulatory Visit: Payer: Self-pay | Admitting: Family Medicine

## 2022-01-19 ENCOUNTER — Telehealth: Payer: Self-pay | Admitting: Family Medicine

## 2022-01-19 NOTE — Telephone Encounter (Signed)
Spoke to the pt and advised her that we need the PT packet

## 2022-01-19 NOTE — Telephone Encounter (Signed)
It is not appropriate to refill this medication at this time.  This is to be used in conjunction w/ diet and exercise and pt is not able to do that at this time

## 2022-01-19 NOTE — Telephone Encounter (Signed)
Encourage patient to contact the pharmacy for refills or they can request refills through [facility]  (Please schedule appointment if patient has not been seen in over a year)    [place] TO: [place] [phone]  MEDICATION NAME & DOSE: phentermine 37.5 mg  NOTES/COMMENTS FROM PATIENT: Is expired; pt states that she has been off of it since end of July; will she need to come in for an appointment in order for it to be refilled?       [place] office please notify patient: It takes 48-72 hours to process rx refill requests Ask patient to call pharmacy to ensure rx is ready before heading there.

## 2022-01-19 NOTE — Telephone Encounter (Signed)
Typically there is a packet of information that needs to be signed after a formal PT evaluation that indicates she is physically able to use the scooter.  I have not received any information or forms to sign

## 2022-01-20 DIAGNOSIS — F32A Depression, unspecified: Secondary | ICD-10-CM | POA: Diagnosis not present

## 2022-01-20 DIAGNOSIS — Z87440 Personal history of urinary (tract) infections: Secondary | ICD-10-CM | POA: Diagnosis not present

## 2022-01-20 DIAGNOSIS — E559 Vitamin D deficiency, unspecified: Secondary | ICD-10-CM | POA: Diagnosis not present

## 2022-01-20 DIAGNOSIS — G629 Polyneuropathy, unspecified: Secondary | ICD-10-CM | POA: Diagnosis not present

## 2022-01-20 DIAGNOSIS — K259 Gastric ulcer, unspecified as acute or chronic, without hemorrhage or perforation: Secondary | ICD-10-CM | POA: Diagnosis not present

## 2022-01-20 DIAGNOSIS — G9589 Other specified diseases of spinal cord: Secondary | ICD-10-CM | POA: Diagnosis not present

## 2022-01-20 DIAGNOSIS — L989 Disorder of the skin and subcutaneous tissue, unspecified: Secondary | ICD-10-CM | POA: Diagnosis not present

## 2022-01-20 DIAGNOSIS — M5136 Other intervertebral disc degeneration, lumbar region: Secondary | ICD-10-CM | POA: Diagnosis not present

## 2022-01-20 DIAGNOSIS — G43909 Migraine, unspecified, not intractable, without status migrainosus: Secondary | ICD-10-CM | POA: Diagnosis not present

## 2022-01-20 DIAGNOSIS — E538 Deficiency of other specified B group vitamins: Secondary | ICD-10-CM | POA: Diagnosis not present

## 2022-01-20 DIAGNOSIS — E785 Hyperlipidemia, unspecified: Secondary | ICD-10-CM | POA: Diagnosis not present

## 2022-01-20 DIAGNOSIS — Z993 Dependence on wheelchair: Secondary | ICD-10-CM | POA: Diagnosis not present

## 2022-01-20 DIAGNOSIS — G4733 Obstructive sleep apnea (adult) (pediatric): Secondary | ICD-10-CM | POA: Diagnosis not present

## 2022-01-20 DIAGNOSIS — K760 Fatty (change of) liver, not elsewhere classified: Secondary | ICD-10-CM | POA: Diagnosis not present

## 2022-01-20 DIAGNOSIS — Z9181 History of falling: Secondary | ICD-10-CM | POA: Diagnosis not present

## 2022-01-20 DIAGNOSIS — G9529 Other cord compression: Secondary | ICD-10-CM | POA: Diagnosis not present

## 2022-01-20 DIAGNOSIS — M4802 Spinal stenosis, cervical region: Secondary | ICD-10-CM | POA: Diagnosis not present

## 2022-01-20 DIAGNOSIS — I872 Venous insufficiency (chronic) (peripheral): Secondary | ICD-10-CM | POA: Diagnosis not present

## 2022-01-20 DIAGNOSIS — M797 Fibromyalgia: Secondary | ICD-10-CM | POA: Diagnosis not present

## 2022-01-20 DIAGNOSIS — M300 Polyarteritis nodosa: Secondary | ICD-10-CM | POA: Diagnosis not present

## 2022-01-20 DIAGNOSIS — I10 Essential (primary) hypertension: Secondary | ICD-10-CM | POA: Diagnosis not present

## 2022-01-20 DIAGNOSIS — K219 Gastro-esophageal reflux disease without esophagitis: Secondary | ICD-10-CM | POA: Diagnosis not present

## 2022-01-24 DIAGNOSIS — F32A Depression, unspecified: Secondary | ICD-10-CM | POA: Diagnosis not present

## 2022-01-24 DIAGNOSIS — G9529 Other cord compression: Secondary | ICD-10-CM | POA: Diagnosis not present

## 2022-01-24 DIAGNOSIS — M5136 Other intervertebral disc degeneration, lumbar region: Secondary | ICD-10-CM | POA: Diagnosis not present

## 2022-01-24 DIAGNOSIS — G9589 Other specified diseases of spinal cord: Secondary | ICD-10-CM | POA: Diagnosis not present

## 2022-01-24 DIAGNOSIS — M4802 Spinal stenosis, cervical region: Secondary | ICD-10-CM | POA: Diagnosis not present

## 2022-01-24 DIAGNOSIS — Z9181 History of falling: Secondary | ICD-10-CM | POA: Diagnosis not present

## 2022-01-24 DIAGNOSIS — K760 Fatty (change of) liver, not elsewhere classified: Secondary | ICD-10-CM | POA: Diagnosis not present

## 2022-01-24 DIAGNOSIS — G629 Polyneuropathy, unspecified: Secondary | ICD-10-CM | POA: Diagnosis not present

## 2022-01-24 DIAGNOSIS — E785 Hyperlipidemia, unspecified: Secondary | ICD-10-CM | POA: Diagnosis not present

## 2022-01-24 DIAGNOSIS — M300 Polyarteritis nodosa: Secondary | ICD-10-CM | POA: Diagnosis not present

## 2022-01-24 DIAGNOSIS — E538 Deficiency of other specified B group vitamins: Secondary | ICD-10-CM | POA: Diagnosis not present

## 2022-01-24 DIAGNOSIS — Z87440 Personal history of urinary (tract) infections: Secondary | ICD-10-CM | POA: Diagnosis not present

## 2022-01-24 DIAGNOSIS — I10 Essential (primary) hypertension: Secondary | ICD-10-CM | POA: Diagnosis not present

## 2022-01-24 DIAGNOSIS — K219 Gastro-esophageal reflux disease without esophagitis: Secondary | ICD-10-CM | POA: Diagnosis not present

## 2022-01-24 DIAGNOSIS — M797 Fibromyalgia: Secondary | ICD-10-CM | POA: Diagnosis not present

## 2022-01-24 DIAGNOSIS — L989 Disorder of the skin and subcutaneous tissue, unspecified: Secondary | ICD-10-CM | POA: Diagnosis not present

## 2022-01-24 DIAGNOSIS — K259 Gastric ulcer, unspecified as acute or chronic, without hemorrhage or perforation: Secondary | ICD-10-CM | POA: Diagnosis not present

## 2022-01-24 DIAGNOSIS — G43909 Migraine, unspecified, not intractable, without status migrainosus: Secondary | ICD-10-CM | POA: Diagnosis not present

## 2022-01-24 DIAGNOSIS — I872 Venous insufficiency (chronic) (peripheral): Secondary | ICD-10-CM | POA: Diagnosis not present

## 2022-01-24 DIAGNOSIS — G4733 Obstructive sleep apnea (adult) (pediatric): Secondary | ICD-10-CM | POA: Diagnosis not present

## 2022-01-24 DIAGNOSIS — Z993 Dependence on wheelchair: Secondary | ICD-10-CM | POA: Diagnosis not present

## 2022-01-24 DIAGNOSIS — E559 Vitamin D deficiency, unspecified: Secondary | ICD-10-CM | POA: Diagnosis not present

## 2022-01-25 ENCOUNTER — Telehealth: Payer: Self-pay | Admitting: Family Medicine

## 2022-01-25 DIAGNOSIS — K760 Fatty (change of) liver, not elsewhere classified: Secondary | ICD-10-CM | POA: Diagnosis not present

## 2022-01-25 DIAGNOSIS — I10 Essential (primary) hypertension: Secondary | ICD-10-CM | POA: Diagnosis not present

## 2022-01-25 DIAGNOSIS — G43909 Migraine, unspecified, not intractable, without status migrainosus: Secondary | ICD-10-CM | POA: Diagnosis not present

## 2022-01-25 DIAGNOSIS — G9529 Other cord compression: Secondary | ICD-10-CM | POA: Diagnosis not present

## 2022-01-25 DIAGNOSIS — E785 Hyperlipidemia, unspecified: Secondary | ICD-10-CM | POA: Diagnosis not present

## 2022-01-25 DIAGNOSIS — F32A Depression, unspecified: Secondary | ICD-10-CM | POA: Diagnosis not present

## 2022-01-25 DIAGNOSIS — I872 Venous insufficiency (chronic) (peripheral): Secondary | ICD-10-CM | POA: Diagnosis not present

## 2022-01-25 DIAGNOSIS — M300 Polyarteritis nodosa: Secondary | ICD-10-CM | POA: Diagnosis not present

## 2022-01-25 DIAGNOSIS — K259 Gastric ulcer, unspecified as acute or chronic, without hemorrhage or perforation: Secondary | ICD-10-CM | POA: Diagnosis not present

## 2022-01-25 DIAGNOSIS — L989 Disorder of the skin and subcutaneous tissue, unspecified: Secondary | ICD-10-CM | POA: Diagnosis not present

## 2022-01-25 DIAGNOSIS — Z993 Dependence on wheelchair: Secondary | ICD-10-CM | POA: Diagnosis not present

## 2022-01-25 DIAGNOSIS — E538 Deficiency of other specified B group vitamins: Secondary | ICD-10-CM | POA: Diagnosis not present

## 2022-01-25 DIAGNOSIS — M797 Fibromyalgia: Secondary | ICD-10-CM | POA: Diagnosis not present

## 2022-01-25 DIAGNOSIS — K219 Gastro-esophageal reflux disease without esophagitis: Secondary | ICD-10-CM | POA: Diagnosis not present

## 2022-01-25 DIAGNOSIS — G629 Polyneuropathy, unspecified: Secondary | ICD-10-CM | POA: Diagnosis not present

## 2022-01-25 DIAGNOSIS — G4733 Obstructive sleep apnea (adult) (pediatric): Secondary | ICD-10-CM | POA: Diagnosis not present

## 2022-01-25 DIAGNOSIS — M4802 Spinal stenosis, cervical region: Secondary | ICD-10-CM | POA: Diagnosis not present

## 2022-01-25 DIAGNOSIS — Z9181 History of falling: Secondary | ICD-10-CM | POA: Diagnosis not present

## 2022-01-25 DIAGNOSIS — E559 Vitamin D deficiency, unspecified: Secondary | ICD-10-CM | POA: Diagnosis not present

## 2022-01-25 DIAGNOSIS — G9589 Other specified diseases of spinal cord: Secondary | ICD-10-CM | POA: Diagnosis not present

## 2022-01-25 DIAGNOSIS — M5136 Other intervertebral disc degeneration, lumbar region: Secondary | ICD-10-CM | POA: Diagnosis not present

## 2022-01-25 DIAGNOSIS — Z87440 Personal history of urinary (tract) infections: Secondary | ICD-10-CM | POA: Diagnosis not present

## 2022-01-25 NOTE — Telephone Encounter (Signed)
faxed

## 2022-01-25 NOTE — Telephone Encounter (Signed)
Caller name: Flecia Shutter   On DPR? :yes/no: Yes  Call back number: [phone]  Provider they see:  Birdie Riddle   Reason for call:  pt called stating that Dr.[name] sent her a letter to give to Dr.Yan, so she can keep Ms.Fohl in her practice. Pt want to know if that letter can be fax to [place] office. Pt can't print letter out and pt doesn't have a fax number for Dr.Yan.    Pt also want to if we can fax over her last labs to her Therapist Eino Farber. Fax number is  [phone]

## 2022-01-26 DIAGNOSIS — K760 Fatty (change of) liver, not elsewhere classified: Secondary | ICD-10-CM | POA: Diagnosis not present

## 2022-01-26 DIAGNOSIS — G9529 Other cord compression: Secondary | ICD-10-CM | POA: Diagnosis not present

## 2022-01-26 DIAGNOSIS — G629 Polyneuropathy, unspecified: Secondary | ICD-10-CM | POA: Diagnosis not present

## 2022-01-26 DIAGNOSIS — K259 Gastric ulcer, unspecified as acute or chronic, without hemorrhage or perforation: Secondary | ICD-10-CM | POA: Diagnosis not present

## 2022-01-26 DIAGNOSIS — M4802 Spinal stenosis, cervical region: Secondary | ICD-10-CM | POA: Diagnosis not present

## 2022-01-26 DIAGNOSIS — E785 Hyperlipidemia, unspecified: Secondary | ICD-10-CM | POA: Diagnosis not present

## 2022-01-26 DIAGNOSIS — F32A Depression, unspecified: Secondary | ICD-10-CM | POA: Diagnosis not present

## 2022-01-26 DIAGNOSIS — M5136 Other intervertebral disc degeneration, lumbar region: Secondary | ICD-10-CM | POA: Diagnosis not present

## 2022-01-26 DIAGNOSIS — G4733 Obstructive sleep apnea (adult) (pediatric): Secondary | ICD-10-CM | POA: Diagnosis not present

## 2022-01-26 DIAGNOSIS — Z87440 Personal history of urinary (tract) infections: Secondary | ICD-10-CM | POA: Diagnosis not present

## 2022-01-26 DIAGNOSIS — M797 Fibromyalgia: Secondary | ICD-10-CM | POA: Diagnosis not present

## 2022-01-26 DIAGNOSIS — G43909 Migraine, unspecified, not intractable, without status migrainosus: Secondary | ICD-10-CM | POA: Diagnosis not present

## 2022-01-26 DIAGNOSIS — K219 Gastro-esophageal reflux disease without esophagitis: Secondary | ICD-10-CM | POA: Diagnosis not present

## 2022-01-26 DIAGNOSIS — Z993 Dependence on wheelchair: Secondary | ICD-10-CM | POA: Diagnosis not present

## 2022-01-26 DIAGNOSIS — Z9181 History of falling: Secondary | ICD-10-CM | POA: Diagnosis not present

## 2022-01-26 DIAGNOSIS — G9589 Other specified diseases of spinal cord: Secondary | ICD-10-CM | POA: Diagnosis not present

## 2022-01-26 DIAGNOSIS — E559 Vitamin D deficiency, unspecified: Secondary | ICD-10-CM | POA: Diagnosis not present

## 2022-01-26 DIAGNOSIS — I10 Essential (primary) hypertension: Secondary | ICD-10-CM | POA: Diagnosis not present

## 2022-01-26 DIAGNOSIS — M300 Polyarteritis nodosa: Secondary | ICD-10-CM | POA: Diagnosis not present

## 2022-01-26 DIAGNOSIS — I872 Venous insufficiency (chronic) (peripheral): Secondary | ICD-10-CM | POA: Diagnosis not present

## 2022-01-26 DIAGNOSIS — E538 Deficiency of other specified B group vitamins: Secondary | ICD-10-CM | POA: Diagnosis not present

## 2022-01-26 DIAGNOSIS — L989 Disorder of the skin and subcutaneous tissue, unspecified: Secondary | ICD-10-CM | POA: Diagnosis not present

## 2022-01-27 ENCOUNTER — Telehealth: Payer: Self-pay | Admitting: Family Medicine

## 2022-01-27 NOTE — Telephone Encounter (Signed)
Do you have an order available for this patient a Scooter I will happy to fax this information over. Please Advise

## 2022-01-27 NOTE — Telephone Encounter (Signed)
Pt called give the fax and telephone number to order her scooter. Fax number is [phone]. Telephone is [phone].

## 2022-01-28 DIAGNOSIS — G9589 Other specified diseases of spinal cord: Secondary | ICD-10-CM | POA: Diagnosis not present

## 2022-01-28 DIAGNOSIS — E559 Vitamin D deficiency, unspecified: Secondary | ICD-10-CM | POA: Diagnosis not present

## 2022-01-28 DIAGNOSIS — K259 Gastric ulcer, unspecified as acute or chronic, without hemorrhage or perforation: Secondary | ICD-10-CM | POA: Diagnosis not present

## 2022-01-28 DIAGNOSIS — M5136 Other intervertebral disc degeneration, lumbar region: Secondary | ICD-10-CM | POA: Diagnosis not present

## 2022-01-28 DIAGNOSIS — Z87440 Personal history of urinary (tract) infections: Secondary | ICD-10-CM | POA: Diagnosis not present

## 2022-01-28 DIAGNOSIS — G9529 Other cord compression: Secondary | ICD-10-CM | POA: Diagnosis not present

## 2022-01-28 DIAGNOSIS — G629 Polyneuropathy, unspecified: Secondary | ICD-10-CM | POA: Diagnosis not present

## 2022-01-28 DIAGNOSIS — K760 Fatty (change of) liver, not elsewhere classified: Secondary | ICD-10-CM | POA: Diagnosis not present

## 2022-01-28 DIAGNOSIS — I872 Venous insufficiency (chronic) (peripheral): Secondary | ICD-10-CM | POA: Diagnosis not present

## 2022-01-28 DIAGNOSIS — Z993 Dependence on wheelchair: Secondary | ICD-10-CM | POA: Diagnosis not present

## 2022-01-28 DIAGNOSIS — M4802 Spinal stenosis, cervical region: Secondary | ICD-10-CM | POA: Diagnosis not present

## 2022-01-28 DIAGNOSIS — E785 Hyperlipidemia, unspecified: Secondary | ICD-10-CM | POA: Diagnosis not present

## 2022-01-28 DIAGNOSIS — E538 Deficiency of other specified B group vitamins: Secondary | ICD-10-CM | POA: Diagnosis not present

## 2022-01-28 DIAGNOSIS — M300 Polyarteritis nodosa: Secondary | ICD-10-CM | POA: Diagnosis not present

## 2022-01-28 DIAGNOSIS — F32A Depression, unspecified: Secondary | ICD-10-CM | POA: Diagnosis not present

## 2022-01-28 DIAGNOSIS — Z9181 History of falling: Secondary | ICD-10-CM | POA: Diagnosis not present

## 2022-01-28 DIAGNOSIS — M797 Fibromyalgia: Secondary | ICD-10-CM | POA: Diagnosis not present

## 2022-01-28 DIAGNOSIS — K219 Gastro-esophageal reflux disease without esophagitis: Secondary | ICD-10-CM | POA: Diagnosis not present

## 2022-01-28 DIAGNOSIS — G43909 Migraine, unspecified, not intractable, without status migrainosus: Secondary | ICD-10-CM | POA: Diagnosis not present

## 2022-01-28 DIAGNOSIS — G4733 Obstructive sleep apnea (adult) (pediatric): Secondary | ICD-10-CM | POA: Diagnosis not present

## 2022-01-28 DIAGNOSIS — I10 Essential (primary) hypertension: Secondary | ICD-10-CM | POA: Diagnosis not present

## 2022-01-28 DIAGNOSIS — L989 Disorder of the skin and subcutaneous tissue, unspecified: Secondary | ICD-10-CM | POA: Diagnosis not present

## 2022-01-29 ENCOUNTER — Other Ambulatory Visit: Payer: Self-pay | Admitting: Family Medicine

## 2022-01-31 DIAGNOSIS — I10 Essential (primary) hypertension: Secondary | ICD-10-CM | POA: Diagnosis not present

## 2022-01-31 DIAGNOSIS — K219 Gastro-esophageal reflux disease without esophagitis: Secondary | ICD-10-CM | POA: Diagnosis not present

## 2022-01-31 DIAGNOSIS — L989 Disorder of the skin and subcutaneous tissue, unspecified: Secondary | ICD-10-CM | POA: Diagnosis not present

## 2022-01-31 DIAGNOSIS — G629 Polyneuropathy, unspecified: Secondary | ICD-10-CM | POA: Diagnosis not present

## 2022-01-31 DIAGNOSIS — G9589 Other specified diseases of spinal cord: Secondary | ICD-10-CM | POA: Diagnosis not present

## 2022-01-31 DIAGNOSIS — K760 Fatty (change of) liver, not elsewhere classified: Secondary | ICD-10-CM | POA: Diagnosis not present

## 2022-01-31 DIAGNOSIS — Z87440 Personal history of urinary (tract) infections: Secondary | ICD-10-CM | POA: Diagnosis not present

## 2022-01-31 DIAGNOSIS — M5136 Other intervertebral disc degeneration, lumbar region: Secondary | ICD-10-CM | POA: Diagnosis not present

## 2022-01-31 DIAGNOSIS — K259 Gastric ulcer, unspecified as acute or chronic, without hemorrhage or perforation: Secondary | ICD-10-CM | POA: Diagnosis not present

## 2022-01-31 DIAGNOSIS — E785 Hyperlipidemia, unspecified: Secondary | ICD-10-CM | POA: Diagnosis not present

## 2022-01-31 DIAGNOSIS — M797 Fibromyalgia: Secondary | ICD-10-CM | POA: Diagnosis not present

## 2022-01-31 DIAGNOSIS — G43909 Migraine, unspecified, not intractable, without status migrainosus: Secondary | ICD-10-CM | POA: Diagnosis not present

## 2022-01-31 DIAGNOSIS — G9529 Other cord compression: Secondary | ICD-10-CM | POA: Diagnosis not present

## 2022-01-31 DIAGNOSIS — M300 Polyarteritis nodosa: Secondary | ICD-10-CM | POA: Diagnosis not present

## 2022-01-31 DIAGNOSIS — I872 Venous insufficiency (chronic) (peripheral): Secondary | ICD-10-CM | POA: Diagnosis not present

## 2022-01-31 DIAGNOSIS — Z9181 History of falling: Secondary | ICD-10-CM | POA: Diagnosis not present

## 2022-01-31 DIAGNOSIS — G4733 Obstructive sleep apnea (adult) (pediatric): Secondary | ICD-10-CM | POA: Diagnosis not present

## 2022-01-31 DIAGNOSIS — E559 Vitamin D deficiency, unspecified: Secondary | ICD-10-CM | POA: Diagnosis not present

## 2022-01-31 DIAGNOSIS — M4802 Spinal stenosis, cervical region: Secondary | ICD-10-CM | POA: Diagnosis not present

## 2022-01-31 DIAGNOSIS — E538 Deficiency of other specified B group vitamins: Secondary | ICD-10-CM | POA: Diagnosis not present

## 2022-01-31 DIAGNOSIS — F32A Depression, unspecified: Secondary | ICD-10-CM | POA: Diagnosis not present

## 2022-01-31 DIAGNOSIS — Z993 Dependence on wheelchair: Secondary | ICD-10-CM | POA: Diagnosis not present

## 2022-02-01 ENCOUNTER — Telehealth: Payer: Self-pay | Admitting: Family Medicine

## 2022-02-01 DIAGNOSIS — M797 Fibromyalgia: Secondary | ICD-10-CM | POA: Diagnosis not present

## 2022-02-01 DIAGNOSIS — G9589 Other specified diseases of spinal cord: Secondary | ICD-10-CM | POA: Diagnosis not present

## 2022-02-01 DIAGNOSIS — Z9181 History of falling: Secondary | ICD-10-CM | POA: Diagnosis not present

## 2022-02-01 DIAGNOSIS — G629 Polyneuropathy, unspecified: Secondary | ICD-10-CM | POA: Diagnosis not present

## 2022-02-01 DIAGNOSIS — I872 Venous insufficiency (chronic) (peripheral): Secondary | ICD-10-CM | POA: Diagnosis not present

## 2022-02-01 DIAGNOSIS — M5136 Other intervertebral disc degeneration, lumbar region: Secondary | ICD-10-CM | POA: Diagnosis not present

## 2022-02-01 DIAGNOSIS — K219 Gastro-esophageal reflux disease without esophagitis: Secondary | ICD-10-CM | POA: Diagnosis not present

## 2022-02-01 DIAGNOSIS — E538 Deficiency of other specified B group vitamins: Secondary | ICD-10-CM | POA: Diagnosis not present

## 2022-02-01 DIAGNOSIS — K760 Fatty (change of) liver, not elsewhere classified: Secondary | ICD-10-CM | POA: Diagnosis not present

## 2022-02-01 DIAGNOSIS — Z87440 Personal history of urinary (tract) infections: Secondary | ICD-10-CM | POA: Diagnosis not present

## 2022-02-01 DIAGNOSIS — G43909 Migraine, unspecified, not intractable, without status migrainosus: Secondary | ICD-10-CM | POA: Diagnosis not present

## 2022-02-01 DIAGNOSIS — L989 Disorder of the skin and subcutaneous tissue, unspecified: Secondary | ICD-10-CM | POA: Diagnosis not present

## 2022-02-01 DIAGNOSIS — F32A Depression, unspecified: Secondary | ICD-10-CM | POA: Diagnosis not present

## 2022-02-01 DIAGNOSIS — M300 Polyarteritis nodosa: Secondary | ICD-10-CM | POA: Diagnosis not present

## 2022-02-01 DIAGNOSIS — E559 Vitamin D deficiency, unspecified: Secondary | ICD-10-CM | POA: Diagnosis not present

## 2022-02-01 DIAGNOSIS — M4802 Spinal stenosis, cervical region: Secondary | ICD-10-CM | POA: Diagnosis not present

## 2022-02-01 DIAGNOSIS — G4733 Obstructive sleep apnea (adult) (pediatric): Secondary | ICD-10-CM | POA: Diagnosis not present

## 2022-02-01 DIAGNOSIS — Z993 Dependence on wheelchair: Secondary | ICD-10-CM | POA: Diagnosis not present

## 2022-02-01 DIAGNOSIS — E785 Hyperlipidemia, unspecified: Secondary | ICD-10-CM | POA: Diagnosis not present

## 2022-02-01 DIAGNOSIS — K259 Gastric ulcer, unspecified as acute or chronic, without hemorrhage or perforation: Secondary | ICD-10-CM | POA: Diagnosis not present

## 2022-02-01 DIAGNOSIS — I10 Essential (primary) hypertension: Secondary | ICD-10-CM | POA: Diagnosis not present

## 2022-02-01 DIAGNOSIS — G9529 Other cord compression: Secondary | ICD-10-CM | POA: Diagnosis not present

## 2022-02-01 NOTE — Telephone Encounter (Signed)
Caller name:  Karletta  On DPR? :yes/no: Yes  Call back number:  [phone]  Provider they see:   Birdie Riddle  Reason for call:   Calling to check on paperwork to get scooter.  Antonito Please advise.

## 2022-02-01 NOTE — Telephone Encounter (Signed)
Spoke w/ Colletta [place] at the [employer] [facility] [employer] . I explained Dr Birdie Riddle will sign the letter to the best of her ability and we never received a copy of anything from PT . She is reaching out to PT to get [place] that copy . Once signed I explained I will send it to her

## 2022-02-01 NOTE — Telephone Encounter (Signed)
Stephanie from [employer] called stating that, she has not received any information about pt last office notes. Colletta Maryland needs office notes from pt last visit on 01/12/22. Please fax to [phone], so she can get pt scooter ordered. Stephanie office number is [phone] if anyone have any questions.

## 2022-02-02 DIAGNOSIS — K259 Gastric ulcer, unspecified as acute or chronic, without hemorrhage or perforation: Secondary | ICD-10-CM | POA: Diagnosis not present

## 2022-02-02 DIAGNOSIS — G9589 Other specified diseases of spinal cord: Secondary | ICD-10-CM | POA: Diagnosis not present

## 2022-02-02 DIAGNOSIS — I10 Essential (primary) hypertension: Secondary | ICD-10-CM | POA: Diagnosis not present

## 2022-02-02 DIAGNOSIS — K760 Fatty (change of) liver, not elsewhere classified: Secondary | ICD-10-CM | POA: Diagnosis not present

## 2022-02-02 DIAGNOSIS — M300 Polyarteritis nodosa: Secondary | ICD-10-CM | POA: Diagnosis not present

## 2022-02-02 DIAGNOSIS — E785 Hyperlipidemia, unspecified: Secondary | ICD-10-CM | POA: Diagnosis not present

## 2022-02-02 DIAGNOSIS — I872 Venous insufficiency (chronic) (peripheral): Secondary | ICD-10-CM | POA: Diagnosis not present

## 2022-02-02 DIAGNOSIS — G9529 Other cord compression: Secondary | ICD-10-CM | POA: Diagnosis not present

## 2022-02-02 DIAGNOSIS — Z87440 Personal history of urinary (tract) infections: Secondary | ICD-10-CM | POA: Diagnosis not present

## 2022-02-02 DIAGNOSIS — K219 Gastro-esophageal reflux disease without esophagitis: Secondary | ICD-10-CM | POA: Diagnosis not present

## 2022-02-02 DIAGNOSIS — G43909 Migraine, unspecified, not intractable, without status migrainosus: Secondary | ICD-10-CM | POA: Diagnosis not present

## 2022-02-02 DIAGNOSIS — L989 Disorder of the skin and subcutaneous tissue, unspecified: Secondary | ICD-10-CM | POA: Diagnosis not present

## 2022-02-02 DIAGNOSIS — E559 Vitamin D deficiency, unspecified: Secondary | ICD-10-CM | POA: Diagnosis not present

## 2022-02-02 DIAGNOSIS — M4802 Spinal stenosis, cervical region: Secondary | ICD-10-CM | POA: Diagnosis not present

## 2022-02-02 DIAGNOSIS — M797 Fibromyalgia: Secondary | ICD-10-CM | POA: Diagnosis not present

## 2022-02-02 DIAGNOSIS — F32A Depression, unspecified: Secondary | ICD-10-CM | POA: Diagnosis not present

## 2022-02-02 DIAGNOSIS — Z993 Dependence on wheelchair: Secondary | ICD-10-CM | POA: Diagnosis not present

## 2022-02-02 DIAGNOSIS — G4733 Obstructive sleep apnea (adult) (pediatric): Secondary | ICD-10-CM | POA: Diagnosis not present

## 2022-02-02 DIAGNOSIS — Z9181 History of falling: Secondary | ICD-10-CM | POA: Diagnosis not present

## 2022-02-02 DIAGNOSIS — M5136 Other intervertebral disc degeneration, lumbar region: Secondary | ICD-10-CM | POA: Diagnosis not present

## 2022-02-02 DIAGNOSIS — G629 Polyneuropathy, unspecified: Secondary | ICD-10-CM | POA: Diagnosis not present

## 2022-02-02 DIAGNOSIS — E538 Deficiency of other specified B group vitamins: Secondary | ICD-10-CM | POA: Diagnosis not present

## 2022-02-03 ENCOUNTER — Other Ambulatory Visit: Payer: Self-pay | Admitting: Family Medicine

## 2022-02-03 ENCOUNTER — Encounter: Payer: Self-pay | Admitting: Family Medicine

## 2022-02-04 DIAGNOSIS — G43909 Migraine, unspecified, not intractable, without status migrainosus: Secondary | ICD-10-CM | POA: Diagnosis not present

## 2022-02-04 DIAGNOSIS — E538 Deficiency of other specified B group vitamins: Secondary | ICD-10-CM | POA: Diagnosis not present

## 2022-02-04 DIAGNOSIS — M4802 Spinal stenosis, cervical region: Secondary | ICD-10-CM | POA: Diagnosis not present

## 2022-02-04 DIAGNOSIS — Z87440 Personal history of urinary (tract) infections: Secondary | ICD-10-CM | POA: Diagnosis not present

## 2022-02-04 DIAGNOSIS — G9589 Other specified diseases of spinal cord: Secondary | ICD-10-CM | POA: Diagnosis not present

## 2022-02-04 DIAGNOSIS — Z9181 History of falling: Secondary | ICD-10-CM | POA: Diagnosis not present

## 2022-02-04 DIAGNOSIS — G4733 Obstructive sleep apnea (adult) (pediatric): Secondary | ICD-10-CM | POA: Diagnosis not present

## 2022-02-04 DIAGNOSIS — L989 Disorder of the skin and subcutaneous tissue, unspecified: Secondary | ICD-10-CM | POA: Diagnosis not present

## 2022-02-04 DIAGNOSIS — G629 Polyneuropathy, unspecified: Secondary | ICD-10-CM | POA: Diagnosis not present

## 2022-02-04 DIAGNOSIS — G9529 Other cord compression: Secondary | ICD-10-CM | POA: Diagnosis not present

## 2022-02-04 DIAGNOSIS — K259 Gastric ulcer, unspecified as acute or chronic, without hemorrhage or perforation: Secondary | ICD-10-CM | POA: Diagnosis not present

## 2022-02-04 DIAGNOSIS — M300 Polyarteritis nodosa: Secondary | ICD-10-CM | POA: Diagnosis not present

## 2022-02-04 DIAGNOSIS — K760 Fatty (change of) liver, not elsewhere classified: Secondary | ICD-10-CM | POA: Diagnosis not present

## 2022-02-04 DIAGNOSIS — M5136 Other intervertebral disc degeneration, lumbar region: Secondary | ICD-10-CM | POA: Diagnosis not present

## 2022-02-04 DIAGNOSIS — E785 Hyperlipidemia, unspecified: Secondary | ICD-10-CM | POA: Diagnosis not present

## 2022-02-04 DIAGNOSIS — K219 Gastro-esophageal reflux disease without esophagitis: Secondary | ICD-10-CM | POA: Diagnosis not present

## 2022-02-04 DIAGNOSIS — I872 Venous insufficiency (chronic) (peripheral): Secondary | ICD-10-CM | POA: Diagnosis not present

## 2022-02-04 DIAGNOSIS — I10 Essential (primary) hypertension: Secondary | ICD-10-CM | POA: Diagnosis not present

## 2022-02-04 DIAGNOSIS — M797 Fibromyalgia: Secondary | ICD-10-CM | POA: Diagnosis not present

## 2022-02-04 DIAGNOSIS — Z993 Dependence on wheelchair: Secondary | ICD-10-CM | POA: Diagnosis not present

## 2022-02-04 DIAGNOSIS — E559 Vitamin D deficiency, unspecified: Secondary | ICD-10-CM | POA: Diagnosis not present

## 2022-02-04 DIAGNOSIS — F32A Depression, unspecified: Secondary | ICD-10-CM | POA: Diagnosis not present

## 2022-02-04 NOTE — Telephone Encounter (Signed)
Caller name: Ruchy  On DPR? :yes/no: Yes  Call back number: [phone]  Provider they see: Birdie Riddle  Reason for call:  calling back about paperwork for scooter; states that they need OV information from 12/2021-current visits. [employer] [phone]

## 2022-02-04 NOTE — Telephone Encounter (Signed)
Do you have forms for [name] in regard to a scooter?

## 2022-02-07 ENCOUNTER — Telehealth: Payer: Self-pay

## 2022-02-07 DIAGNOSIS — A419 Sepsis, unspecified organism: Secondary | ICD-10-CM | POA: Diagnosis not present

## 2022-02-07 DIAGNOSIS — E871 Hypo-osmolality and hyponatremia: Secondary | ICD-10-CM | POA: Diagnosis not present

## 2022-02-07 DIAGNOSIS — G4733 Obstructive sleep apnea (adult) (pediatric): Secondary | ICD-10-CM | POA: Diagnosis not present

## 2022-02-07 DIAGNOSIS — G9589 Other specified diseases of spinal cord: Secondary | ICD-10-CM

## 2022-02-07 NOTE — Telephone Encounter (Signed)
Please dismiss patient from our clinic

## 2022-02-07 NOTE — Telephone Encounter (Signed)
Charm Rings,  We received a letter today for this pateint asking for another appointment with our office. After review of her chart, she has no showed or cancelled within 24 hours 5+ different appointments with both Dr Krista Blue and Dr Leta Baptist. I was not sure if either of the providers wished for the patient to be dismissed.   1/3 Dr. Leta Baptist - Patient was running a fever, cancelled a few hours before her apt. 1/6 Dr Leta Baptist - No Show/Cancel within 24 hours (Transportation issues, wheelchair accessible) 2/7 Dr Krista Blue - No Show/Cancel within 24 hours (Issues with public transportation) 4/88 Dr Krista Blue - no show 6/7 Dr Krista Blue - Patient (family emergency) cancelled a few hours prior to apt 6/12 Dr Krista Blue - No Show/Cancel within 24 hours (lack of transportation) 6/29 Dr Krista Blue - no show 8/29 Dr Krista Blue - no show  Can you please advise how to proceed?

## 2022-02-08 ENCOUNTER — Encounter: Payer: Self-pay | Admitting: Neurology

## 2022-02-08 ENCOUNTER — Telehealth: Payer: Self-pay | Admitting: Family Medicine

## 2022-02-08 DIAGNOSIS — Z9181 History of falling: Secondary | ICD-10-CM | POA: Diagnosis not present

## 2022-02-08 DIAGNOSIS — M797 Fibromyalgia: Secondary | ICD-10-CM | POA: Diagnosis not present

## 2022-02-08 DIAGNOSIS — G43909 Migraine, unspecified, not intractable, without status migrainosus: Secondary | ICD-10-CM | POA: Diagnosis not present

## 2022-02-08 DIAGNOSIS — I10 Essential (primary) hypertension: Secondary | ICD-10-CM | POA: Diagnosis not present

## 2022-02-08 DIAGNOSIS — Z87440 Personal history of urinary (tract) infections: Secondary | ICD-10-CM | POA: Diagnosis not present

## 2022-02-08 DIAGNOSIS — G9529 Other cord compression: Secondary | ICD-10-CM | POA: Diagnosis not present

## 2022-02-08 DIAGNOSIS — E785 Hyperlipidemia, unspecified: Secondary | ICD-10-CM | POA: Diagnosis not present

## 2022-02-08 DIAGNOSIS — F32A Depression, unspecified: Secondary | ICD-10-CM | POA: Diagnosis not present

## 2022-02-08 DIAGNOSIS — Z993 Dependence on wheelchair: Secondary | ICD-10-CM | POA: Diagnosis not present

## 2022-02-08 DIAGNOSIS — M4802 Spinal stenosis, cervical region: Secondary | ICD-10-CM | POA: Diagnosis not present

## 2022-02-08 DIAGNOSIS — K219 Gastro-esophageal reflux disease without esophagitis: Secondary | ICD-10-CM | POA: Diagnosis not present

## 2022-02-08 DIAGNOSIS — G4733 Obstructive sleep apnea (adult) (pediatric): Secondary | ICD-10-CM | POA: Diagnosis not present

## 2022-02-08 DIAGNOSIS — G629 Polyneuropathy, unspecified: Secondary | ICD-10-CM | POA: Diagnosis not present

## 2022-02-08 DIAGNOSIS — G9589 Other specified diseases of spinal cord: Secondary | ICD-10-CM | POA: Diagnosis not present

## 2022-02-08 DIAGNOSIS — I872 Venous insufficiency (chronic) (peripheral): Secondary | ICD-10-CM | POA: Diagnosis not present

## 2022-02-08 DIAGNOSIS — S81811D Laceration without foreign body, right lower leg, subsequent encounter: Secondary | ICD-10-CM | POA: Diagnosis not present

## 2022-02-08 DIAGNOSIS — K760 Fatty (change of) liver, not elsewhere classified: Secondary | ICD-10-CM | POA: Diagnosis not present

## 2022-02-08 DIAGNOSIS — E538 Deficiency of other specified B group vitamins: Secondary | ICD-10-CM | POA: Diagnosis not present

## 2022-02-08 DIAGNOSIS — M5136 Other intervertebral disc degeneration, lumbar region: Secondary | ICD-10-CM | POA: Diagnosis not present

## 2022-02-08 DIAGNOSIS — E559 Vitamin D deficiency, unspecified: Secondary | ICD-10-CM | POA: Diagnosis not present

## 2022-02-08 DIAGNOSIS — K259 Gastric ulcer, unspecified as acute or chronic, without hemorrhage or perforation: Secondary | ICD-10-CM | POA: Diagnosis not present

## 2022-02-08 DIAGNOSIS — M300 Polyarteritis nodosa: Secondary | ICD-10-CM | POA: Diagnosis not present

## 2022-02-08 NOTE — Telephone Encounter (Signed)
Dr. Birdie Riddle:  Thank you for reaching out. Sorry, I made the decision of dismissing her without looking into details. I reviewed MRI cervical spine from Jan 2023, agree with Dr. Waunita Schooner and you sending her to neurosurgeon for decompression.  Cervical myelopathy will likely explains her weakness.   She can come back to our clinic if needed.   I will inform our staff too.   Aliene Beams

## 2022-02-08 NOTE — Addendum Note (Signed)
Addended by: [name] on: 02/08/2022 10:58 AM   Modules accepted: Orders

## 2022-02-08 NOTE — Telephone Encounter (Signed)
Caller name: Cecelia w/ [facility]  On DPR? :yes/no: No  Call back number: [phone]  Provider they see:  Birdie Riddle  Reason for call: orders for 2 x wk for 4 wks PT

## 2022-02-08 NOTE — Telephone Encounter (Signed)
Spoke w/ Cecelia at [facility] and advised it is ok per  Dr Birdie Riddle if pt has PT  2 wk x wk for 4 wks

## 2022-02-08 NOTE — Telephone Encounter (Signed)
Ok for PT order.

## 2022-02-08 NOTE — Telephone Encounter (Signed)
Faxed over the last office note to [place] to [phone]

## 2022-02-08 NOTE — Telephone Encounter (Signed)
Did you get these forms back and complete them?

## 2022-02-08 NOTE — Telephone Encounter (Signed)
Referral placed to Neurosurgery.  In regards to pt dismissal from practice, she had a terrible time getting appropriate and reliable transportation.  She has been bed bound for months and unable to transfer to a car and rather than sending a transport van, they would send a small car to pick her up.  She no-showed and canceled multiple appts here as well, and almost all were transport related.  I would ask that she be given another chance as these things were out of her control.  Thank you for your consideration.

## 2022-02-09 ENCOUNTER — Other Ambulatory Visit: Payer: Self-pay | Admitting: Family Medicine

## 2022-02-09 NOTE — Telephone Encounter (Signed)
Patient is requesting a refill of the following medications: Requested Prescriptions   Pending Prescriptions Disp Refills   HYDROcodone-acetaminophen ([place]) 10-325 MG tablet [Pharmacy Med Name: hydrocodone 10 mg-acetaminophen 325 mg tablet] 90 tablet 0    Sig: TAKE 1 TABLET BY MOUTH EVERY 6 HOURS AS NEEDED    Date of patient request: 02/09/22 Last office visit: 01/12/22 Date of last refill: 01/13/22 Last refill amount: 90

## 2022-02-09 NOTE — Addendum Note (Signed)
Addended by: [name] on: 02/09/2022 10:39 AM   Modules accepted: Orders

## 2022-02-10 DIAGNOSIS — E559 Vitamin D deficiency, unspecified: Secondary | ICD-10-CM | POA: Diagnosis not present

## 2022-02-10 DIAGNOSIS — M5136 Other intervertebral disc degeneration, lumbar region: Secondary | ICD-10-CM | POA: Diagnosis not present

## 2022-02-10 DIAGNOSIS — E538 Deficiency of other specified B group vitamins: Secondary | ICD-10-CM | POA: Diagnosis not present

## 2022-02-10 DIAGNOSIS — K259 Gastric ulcer, unspecified as acute or chronic, without hemorrhage or perforation: Secondary | ICD-10-CM | POA: Diagnosis not present

## 2022-02-10 DIAGNOSIS — S81811D Laceration without foreign body, right lower leg, subsequent encounter: Secondary | ICD-10-CM | POA: Diagnosis not present

## 2022-02-10 DIAGNOSIS — Z9181 History of falling: Secondary | ICD-10-CM | POA: Diagnosis not present

## 2022-02-10 DIAGNOSIS — Z87440 Personal history of urinary (tract) infections: Secondary | ICD-10-CM | POA: Diagnosis not present

## 2022-02-10 DIAGNOSIS — M4802 Spinal stenosis, cervical region: Secondary | ICD-10-CM | POA: Diagnosis not present

## 2022-02-10 DIAGNOSIS — G9589 Other specified diseases of spinal cord: Secondary | ICD-10-CM | POA: Diagnosis not present

## 2022-02-10 DIAGNOSIS — M300 Polyarteritis nodosa: Secondary | ICD-10-CM | POA: Diagnosis not present

## 2022-02-10 DIAGNOSIS — E785 Hyperlipidemia, unspecified: Secondary | ICD-10-CM | POA: Diagnosis not present

## 2022-02-10 DIAGNOSIS — I10 Essential (primary) hypertension: Secondary | ICD-10-CM | POA: Diagnosis not present

## 2022-02-10 DIAGNOSIS — K760 Fatty (change of) liver, not elsewhere classified: Secondary | ICD-10-CM | POA: Diagnosis not present

## 2022-02-10 DIAGNOSIS — K219 Gastro-esophageal reflux disease without esophagitis: Secondary | ICD-10-CM | POA: Diagnosis not present

## 2022-02-10 DIAGNOSIS — G43909 Migraine, unspecified, not intractable, without status migrainosus: Secondary | ICD-10-CM | POA: Diagnosis not present

## 2022-02-10 DIAGNOSIS — G629 Polyneuropathy, unspecified: Secondary | ICD-10-CM | POA: Diagnosis not present

## 2022-02-10 DIAGNOSIS — I872 Venous insufficiency (chronic) (peripheral): Secondary | ICD-10-CM | POA: Diagnosis not present

## 2022-02-10 DIAGNOSIS — G4733 Obstructive sleep apnea (adult) (pediatric): Secondary | ICD-10-CM | POA: Diagnosis not present

## 2022-02-10 DIAGNOSIS — M797 Fibromyalgia: Secondary | ICD-10-CM | POA: Diagnosis not present

## 2022-02-10 DIAGNOSIS — G9529 Other cord compression: Secondary | ICD-10-CM | POA: Diagnosis not present

## 2022-02-10 DIAGNOSIS — F32A Depression, unspecified: Secondary | ICD-10-CM | POA: Diagnosis not present

## 2022-02-10 DIAGNOSIS — Z993 Dependence on wheelchair: Secondary | ICD-10-CM | POA: Diagnosis not present

## 2022-02-11 DIAGNOSIS — G9589 Other specified diseases of spinal cord: Secondary | ICD-10-CM | POA: Diagnosis not present

## 2022-02-11 DIAGNOSIS — M300 Polyarteritis nodosa: Secondary | ICD-10-CM | POA: Diagnosis not present

## 2022-02-11 DIAGNOSIS — G4733 Obstructive sleep apnea (adult) (pediatric): Secondary | ICD-10-CM | POA: Diagnosis not present

## 2022-02-11 DIAGNOSIS — E785 Hyperlipidemia, unspecified: Secondary | ICD-10-CM | POA: Diagnosis not present

## 2022-02-11 DIAGNOSIS — Z9181 History of falling: Secondary | ICD-10-CM | POA: Diagnosis not present

## 2022-02-11 DIAGNOSIS — Z993 Dependence on wheelchair: Secondary | ICD-10-CM | POA: Diagnosis not present

## 2022-02-11 DIAGNOSIS — K760 Fatty (change of) liver, not elsewhere classified: Secondary | ICD-10-CM | POA: Diagnosis not present

## 2022-02-11 DIAGNOSIS — I872 Venous insufficiency (chronic) (peripheral): Secondary | ICD-10-CM | POA: Diagnosis not present

## 2022-02-11 DIAGNOSIS — G43909 Migraine, unspecified, not intractable, without status migrainosus: Secondary | ICD-10-CM | POA: Diagnosis not present

## 2022-02-11 DIAGNOSIS — K259 Gastric ulcer, unspecified as acute or chronic, without hemorrhage or perforation: Secondary | ICD-10-CM | POA: Diagnosis not present

## 2022-02-11 DIAGNOSIS — M5136 Other intervertebral disc degeneration, lumbar region: Secondary | ICD-10-CM | POA: Diagnosis not present

## 2022-02-11 DIAGNOSIS — E538 Deficiency of other specified B group vitamins: Secondary | ICD-10-CM | POA: Diagnosis not present

## 2022-02-11 DIAGNOSIS — K219 Gastro-esophageal reflux disease without esophagitis: Secondary | ICD-10-CM | POA: Diagnosis not present

## 2022-02-11 DIAGNOSIS — Z87440 Personal history of urinary (tract) infections: Secondary | ICD-10-CM | POA: Diagnosis not present

## 2022-02-11 DIAGNOSIS — F32A Depression, unspecified: Secondary | ICD-10-CM | POA: Diagnosis not present

## 2022-02-11 DIAGNOSIS — I10 Essential (primary) hypertension: Secondary | ICD-10-CM | POA: Diagnosis not present

## 2022-02-11 DIAGNOSIS — M797 Fibromyalgia: Secondary | ICD-10-CM | POA: Diagnosis not present

## 2022-02-11 DIAGNOSIS — E559 Vitamin D deficiency, unspecified: Secondary | ICD-10-CM | POA: Diagnosis not present

## 2022-02-11 DIAGNOSIS — S81811D Laceration without foreign body, right lower leg, subsequent encounter: Secondary | ICD-10-CM | POA: Diagnosis not present

## 2022-02-11 DIAGNOSIS — G629 Polyneuropathy, unspecified: Secondary | ICD-10-CM | POA: Diagnosis not present

## 2022-02-11 DIAGNOSIS — G9529 Other cord compression: Secondary | ICD-10-CM | POA: Diagnosis not present

## 2022-02-11 DIAGNOSIS — M4802 Spinal stenosis, cervical region: Secondary | ICD-10-CM | POA: Diagnosis not present

## 2022-02-12 ENCOUNTER — Other Ambulatory Visit: Payer: Self-pay | Admitting: Family Medicine

## 2022-02-14 DIAGNOSIS — I10 Essential (primary) hypertension: Secondary | ICD-10-CM | POA: Diagnosis not present

## 2022-02-14 DIAGNOSIS — G43909 Migraine, unspecified, not intractable, without status migrainosus: Secondary | ICD-10-CM | POA: Diagnosis not present

## 2022-02-14 DIAGNOSIS — K219 Gastro-esophageal reflux disease without esophagitis: Secondary | ICD-10-CM | POA: Diagnosis not present

## 2022-02-14 DIAGNOSIS — K760 Fatty (change of) liver, not elsewhere classified: Secondary | ICD-10-CM | POA: Diagnosis not present

## 2022-02-14 DIAGNOSIS — G9589 Other specified diseases of spinal cord: Secondary | ICD-10-CM | POA: Diagnosis not present

## 2022-02-14 DIAGNOSIS — M300 Polyarteritis nodosa: Secondary | ICD-10-CM | POA: Diagnosis not present

## 2022-02-14 DIAGNOSIS — G629 Polyneuropathy, unspecified: Secondary | ICD-10-CM | POA: Diagnosis not present

## 2022-02-14 DIAGNOSIS — S81811D Laceration without foreign body, right lower leg, subsequent encounter: Secondary | ICD-10-CM | POA: Diagnosis not present

## 2022-02-14 DIAGNOSIS — G9529 Other cord compression: Secondary | ICD-10-CM | POA: Diagnosis not present

## 2022-02-14 DIAGNOSIS — K259 Gastric ulcer, unspecified as acute or chronic, without hemorrhage or perforation: Secondary | ICD-10-CM | POA: Diagnosis not present

## 2022-02-14 DIAGNOSIS — M5136 Other intervertebral disc degeneration, lumbar region: Secondary | ICD-10-CM | POA: Diagnosis not present

## 2022-02-14 DIAGNOSIS — Z993 Dependence on wheelchair: Secondary | ICD-10-CM | POA: Diagnosis not present

## 2022-02-14 DIAGNOSIS — Z87440 Personal history of urinary (tract) infections: Secondary | ICD-10-CM | POA: Diagnosis not present

## 2022-02-14 DIAGNOSIS — Z9181 History of falling: Secondary | ICD-10-CM | POA: Diagnosis not present

## 2022-02-14 DIAGNOSIS — E538 Deficiency of other specified B group vitamins: Secondary | ICD-10-CM | POA: Diagnosis not present

## 2022-02-14 DIAGNOSIS — E785 Hyperlipidemia, unspecified: Secondary | ICD-10-CM | POA: Diagnosis not present

## 2022-02-14 DIAGNOSIS — G4733 Obstructive sleep apnea (adult) (pediatric): Secondary | ICD-10-CM | POA: Diagnosis not present

## 2022-02-14 DIAGNOSIS — F32A Depression, unspecified: Secondary | ICD-10-CM | POA: Diagnosis not present

## 2022-02-14 DIAGNOSIS — E559 Vitamin D deficiency, unspecified: Secondary | ICD-10-CM | POA: Diagnosis not present

## 2022-02-14 DIAGNOSIS — M797 Fibromyalgia: Secondary | ICD-10-CM | POA: Diagnosis not present

## 2022-02-14 DIAGNOSIS — I872 Venous insufficiency (chronic) (peripheral): Secondary | ICD-10-CM | POA: Diagnosis not present

## 2022-02-14 DIAGNOSIS — M4802 Spinal stenosis, cervical region: Secondary | ICD-10-CM | POA: Diagnosis not present

## 2022-02-15 DIAGNOSIS — Z993 Dependence on wheelchair: Secondary | ICD-10-CM | POA: Diagnosis not present

## 2022-02-15 DIAGNOSIS — E559 Vitamin D deficiency, unspecified: Secondary | ICD-10-CM | POA: Diagnosis not present

## 2022-02-15 DIAGNOSIS — Z87440 Personal history of urinary (tract) infections: Secondary | ICD-10-CM | POA: Diagnosis not present

## 2022-02-15 DIAGNOSIS — G4733 Obstructive sleep apnea (adult) (pediatric): Secondary | ICD-10-CM | POA: Diagnosis not present

## 2022-02-15 DIAGNOSIS — Z9181 History of falling: Secondary | ICD-10-CM | POA: Diagnosis not present

## 2022-02-15 DIAGNOSIS — K760 Fatty (change of) liver, not elsewhere classified: Secondary | ICD-10-CM | POA: Diagnosis not present

## 2022-02-15 DIAGNOSIS — K219 Gastro-esophageal reflux disease without esophagitis: Secondary | ICD-10-CM | POA: Diagnosis not present

## 2022-02-15 DIAGNOSIS — E538 Deficiency of other specified B group vitamins: Secondary | ICD-10-CM | POA: Diagnosis not present

## 2022-02-15 DIAGNOSIS — G43909 Migraine, unspecified, not intractable, without status migrainosus: Secondary | ICD-10-CM | POA: Diagnosis not present

## 2022-02-15 DIAGNOSIS — S81811D Laceration without foreign body, right lower leg, subsequent encounter: Secondary | ICD-10-CM | POA: Diagnosis not present

## 2022-02-15 DIAGNOSIS — G629 Polyneuropathy, unspecified: Secondary | ICD-10-CM | POA: Diagnosis not present

## 2022-02-15 DIAGNOSIS — G9529 Other cord compression: Secondary | ICD-10-CM | POA: Diagnosis not present

## 2022-02-15 DIAGNOSIS — M797 Fibromyalgia: Secondary | ICD-10-CM | POA: Diagnosis not present

## 2022-02-15 DIAGNOSIS — M300 Polyarteritis nodosa: Secondary | ICD-10-CM | POA: Diagnosis not present

## 2022-02-15 DIAGNOSIS — F32A Depression, unspecified: Secondary | ICD-10-CM | POA: Diagnosis not present

## 2022-02-15 DIAGNOSIS — I10 Essential (primary) hypertension: Secondary | ICD-10-CM | POA: Diagnosis not present

## 2022-02-15 DIAGNOSIS — M5136 Other intervertebral disc degeneration, lumbar region: Secondary | ICD-10-CM | POA: Diagnosis not present

## 2022-02-15 DIAGNOSIS — I872 Venous insufficiency (chronic) (peripheral): Secondary | ICD-10-CM | POA: Diagnosis not present

## 2022-02-15 DIAGNOSIS — G9589 Other specified diseases of spinal cord: Secondary | ICD-10-CM | POA: Diagnosis not present

## 2022-02-15 DIAGNOSIS — E785 Hyperlipidemia, unspecified: Secondary | ICD-10-CM | POA: Diagnosis not present

## 2022-02-15 DIAGNOSIS — K259 Gastric ulcer, unspecified as acute or chronic, without hemorrhage or perforation: Secondary | ICD-10-CM | POA: Diagnosis not present

## 2022-02-15 DIAGNOSIS — M4802 Spinal stenosis, cervical region: Secondary | ICD-10-CM | POA: Diagnosis not present

## 2022-02-16 ENCOUNTER — Telehealth: Payer: Self-pay | Admitting: Family Medicine

## 2022-02-16 NOTE — Telephone Encounter (Signed)
Forms have been faxed to home health and sent to [facility] as well . Placed in scan

## 2022-02-16 NOTE — Telephone Encounter (Signed)
Received Home Health Certification and plan of Care from [facility]. Placed in front bin with charge sheet

## 2022-02-16 NOTE — Telephone Encounter (Signed)
Placed in Dr Birdie Riddle to be sign folder and once completed I will fax back

## 2022-02-17 ENCOUNTER — Other Ambulatory Visit: Payer: Self-pay | Admitting: Family Medicine

## 2022-02-17 DIAGNOSIS — K760 Fatty (change of) liver, not elsewhere classified: Secondary | ICD-10-CM | POA: Diagnosis not present

## 2022-02-17 DIAGNOSIS — K259 Gastric ulcer, unspecified as acute or chronic, without hemorrhage or perforation: Secondary | ICD-10-CM | POA: Diagnosis not present

## 2022-02-17 DIAGNOSIS — S81811D Laceration without foreign body, right lower leg, subsequent encounter: Secondary | ICD-10-CM | POA: Diagnosis not present

## 2022-02-17 DIAGNOSIS — I10 Essential (primary) hypertension: Secondary | ICD-10-CM | POA: Diagnosis not present

## 2022-02-17 DIAGNOSIS — G629 Polyneuropathy, unspecified: Secondary | ICD-10-CM | POA: Diagnosis not present

## 2022-02-17 DIAGNOSIS — M300 Polyarteritis nodosa: Secondary | ICD-10-CM | POA: Diagnosis not present

## 2022-02-17 DIAGNOSIS — Z87440 Personal history of urinary (tract) infections: Secondary | ICD-10-CM | POA: Diagnosis not present

## 2022-02-17 DIAGNOSIS — G9589 Other specified diseases of spinal cord: Secondary | ICD-10-CM | POA: Diagnosis not present

## 2022-02-17 DIAGNOSIS — E559 Vitamin D deficiency, unspecified: Secondary | ICD-10-CM | POA: Diagnosis not present

## 2022-02-17 DIAGNOSIS — G9529 Other cord compression: Secondary | ICD-10-CM | POA: Diagnosis not present

## 2022-02-17 DIAGNOSIS — Z993 Dependence on wheelchair: Secondary | ICD-10-CM | POA: Diagnosis not present

## 2022-02-17 DIAGNOSIS — I872 Venous insufficiency (chronic) (peripheral): Secondary | ICD-10-CM | POA: Diagnosis not present

## 2022-02-17 DIAGNOSIS — F32A Depression, unspecified: Secondary | ICD-10-CM | POA: Diagnosis not present

## 2022-02-17 DIAGNOSIS — K219 Gastro-esophageal reflux disease without esophagitis: Secondary | ICD-10-CM | POA: Diagnosis not present

## 2022-02-17 DIAGNOSIS — M5136 Other intervertebral disc degeneration, lumbar region: Secondary | ICD-10-CM | POA: Diagnosis not present

## 2022-02-17 DIAGNOSIS — M4802 Spinal stenosis, cervical region: Secondary | ICD-10-CM | POA: Diagnosis not present

## 2022-02-17 DIAGNOSIS — G43909 Migraine, unspecified, not intractable, without status migrainosus: Secondary | ICD-10-CM | POA: Diagnosis not present

## 2022-02-17 DIAGNOSIS — M797 Fibromyalgia: Secondary | ICD-10-CM | POA: Diagnosis not present

## 2022-02-17 DIAGNOSIS — E785 Hyperlipidemia, unspecified: Secondary | ICD-10-CM | POA: Diagnosis not present

## 2022-02-17 DIAGNOSIS — Z9181 History of falling: Secondary | ICD-10-CM | POA: Diagnosis not present

## 2022-02-17 DIAGNOSIS — E538 Deficiency of other specified B group vitamins: Secondary | ICD-10-CM | POA: Diagnosis not present

## 2022-02-17 DIAGNOSIS — G4733 Obstructive sleep apnea (adult) (pediatric): Secondary | ICD-10-CM | POA: Diagnosis not present

## 2022-02-17 NOTE — Telephone Encounter (Signed)
Abilify 15 mg LOV: 01/12/22 Last Refill:09/14/21 Upcoming appt: none

## 2022-02-21 DIAGNOSIS — I872 Venous insufficiency (chronic) (peripheral): Secondary | ICD-10-CM | POA: Diagnosis not present

## 2022-02-21 DIAGNOSIS — Z993 Dependence on wheelchair: Secondary | ICD-10-CM | POA: Diagnosis not present

## 2022-02-21 DIAGNOSIS — G4733 Obstructive sleep apnea (adult) (pediatric): Secondary | ICD-10-CM | POA: Diagnosis not present

## 2022-02-21 DIAGNOSIS — G9589 Other specified diseases of spinal cord: Secondary | ICD-10-CM | POA: Diagnosis not present

## 2022-02-21 DIAGNOSIS — F32A Depression, unspecified: Secondary | ICD-10-CM | POA: Diagnosis not present

## 2022-02-21 DIAGNOSIS — G9529 Other cord compression: Secondary | ICD-10-CM | POA: Diagnosis not present

## 2022-02-21 DIAGNOSIS — M4802 Spinal stenosis, cervical region: Secondary | ICD-10-CM | POA: Diagnosis not present

## 2022-02-21 DIAGNOSIS — E559 Vitamin D deficiency, unspecified: Secondary | ICD-10-CM | POA: Diagnosis not present

## 2022-02-21 DIAGNOSIS — E538 Deficiency of other specified B group vitamins: Secondary | ICD-10-CM | POA: Diagnosis not present

## 2022-02-21 DIAGNOSIS — S81811D Laceration without foreign body, right lower leg, subsequent encounter: Secondary | ICD-10-CM | POA: Diagnosis not present

## 2022-02-21 DIAGNOSIS — Z87440 Personal history of urinary (tract) infections: Secondary | ICD-10-CM | POA: Diagnosis not present

## 2022-02-21 DIAGNOSIS — M797 Fibromyalgia: Secondary | ICD-10-CM | POA: Diagnosis not present

## 2022-02-21 DIAGNOSIS — K259 Gastric ulcer, unspecified as acute or chronic, without hemorrhage or perforation: Secondary | ICD-10-CM | POA: Diagnosis not present

## 2022-02-21 DIAGNOSIS — Z9181 History of falling: Secondary | ICD-10-CM | POA: Diagnosis not present

## 2022-02-21 DIAGNOSIS — I10 Essential (primary) hypertension: Secondary | ICD-10-CM | POA: Diagnosis not present

## 2022-02-21 DIAGNOSIS — K760 Fatty (change of) liver, not elsewhere classified: Secondary | ICD-10-CM | POA: Diagnosis not present

## 2022-02-21 DIAGNOSIS — E785 Hyperlipidemia, unspecified: Secondary | ICD-10-CM | POA: Diagnosis not present

## 2022-02-21 DIAGNOSIS — G43909 Migraine, unspecified, not intractable, without status migrainosus: Secondary | ICD-10-CM | POA: Diagnosis not present

## 2022-02-21 DIAGNOSIS — M300 Polyarteritis nodosa: Secondary | ICD-10-CM | POA: Diagnosis not present

## 2022-02-21 DIAGNOSIS — M5136 Other intervertebral disc degeneration, lumbar region: Secondary | ICD-10-CM | POA: Diagnosis not present

## 2022-02-21 DIAGNOSIS — K219 Gastro-esophageal reflux disease without esophagitis: Secondary | ICD-10-CM | POA: Diagnosis not present

## 2022-02-21 DIAGNOSIS — G629 Polyneuropathy, unspecified: Secondary | ICD-10-CM | POA: Diagnosis not present

## 2022-02-22 ENCOUNTER — Telehealth: Payer: Self-pay | Admitting: Family Medicine

## 2022-02-22 DIAGNOSIS — K259 Gastric ulcer, unspecified as acute or chronic, without hemorrhage or perforation: Secondary | ICD-10-CM | POA: Diagnosis not present

## 2022-02-22 DIAGNOSIS — G9589 Other specified diseases of spinal cord: Secondary | ICD-10-CM | POA: Diagnosis not present

## 2022-02-22 DIAGNOSIS — G43909 Migraine, unspecified, not intractable, without status migrainosus: Secondary | ICD-10-CM | POA: Diagnosis not present

## 2022-02-22 DIAGNOSIS — Z87440 Personal history of urinary (tract) infections: Secondary | ICD-10-CM | POA: Diagnosis not present

## 2022-02-22 DIAGNOSIS — G629 Polyneuropathy, unspecified: Secondary | ICD-10-CM | POA: Diagnosis not present

## 2022-02-22 DIAGNOSIS — F419 Anxiety disorder, unspecified: Secondary | ICD-10-CM

## 2022-02-22 DIAGNOSIS — K760 Fatty (change of) liver, not elsewhere classified: Secondary | ICD-10-CM | POA: Diagnosis not present

## 2022-02-22 DIAGNOSIS — G4733 Obstructive sleep apnea (adult) (pediatric): Secondary | ICD-10-CM | POA: Diagnosis not present

## 2022-02-22 DIAGNOSIS — F32A Depression, unspecified: Secondary | ICD-10-CM | POA: Diagnosis not present

## 2022-02-22 DIAGNOSIS — E559 Vitamin D deficiency, unspecified: Secondary | ICD-10-CM | POA: Diagnosis not present

## 2022-02-22 DIAGNOSIS — S81811D Laceration without foreign body, right lower leg, subsequent encounter: Secondary | ICD-10-CM | POA: Diagnosis not present

## 2022-02-22 DIAGNOSIS — M5136 Other intervertebral disc degeneration, lumbar region: Secondary | ICD-10-CM | POA: Diagnosis not present

## 2022-02-22 DIAGNOSIS — I10 Essential (primary) hypertension: Secondary | ICD-10-CM | POA: Diagnosis not present

## 2022-02-22 DIAGNOSIS — E785 Hyperlipidemia, unspecified: Secondary | ICD-10-CM | POA: Diagnosis not present

## 2022-02-22 DIAGNOSIS — Z9181 History of falling: Secondary | ICD-10-CM | POA: Diagnosis not present

## 2022-02-22 DIAGNOSIS — K219 Gastro-esophageal reflux disease without esophagitis: Secondary | ICD-10-CM | POA: Diagnosis not present

## 2022-02-22 DIAGNOSIS — M300 Polyarteritis nodosa: Secondary | ICD-10-CM | POA: Diagnosis not present

## 2022-02-22 DIAGNOSIS — M4802 Spinal stenosis, cervical region: Secondary | ICD-10-CM | POA: Diagnosis not present

## 2022-02-22 DIAGNOSIS — Z993 Dependence on wheelchair: Secondary | ICD-10-CM | POA: Diagnosis not present

## 2022-02-22 DIAGNOSIS — M797 Fibromyalgia: Secondary | ICD-10-CM | POA: Diagnosis not present

## 2022-02-22 DIAGNOSIS — I872 Venous insufficiency (chronic) (peripheral): Secondary | ICD-10-CM | POA: Diagnosis not present

## 2022-02-22 DIAGNOSIS — G9529 Other cord compression: Secondary | ICD-10-CM | POA: Diagnosis not present

## 2022-02-22 DIAGNOSIS — E538 Deficiency of other specified B group vitamins: Secondary | ICD-10-CM | POA: Diagnosis not present

## 2022-02-22 NOTE — Telephone Encounter (Signed)
Received Home Health form from [facility]. Placed in Dr. Virgil Benedict bin.

## 2022-02-22 NOTE — Telephone Encounter (Signed)
Placed forms in Dr [name] to be signed folder once signed I will fax back  

## 2022-02-24 NOTE — Telephone Encounter (Signed)
Notes have been faxed

## 2022-02-24 NOTE — Telephone Encounter (Signed)
[name] wants to know if someone could fax over her last OV notes again to [phone].

## 2022-02-24 NOTE — Telephone Encounter (Signed)
[place] faxed the forms this morning placed in scan

## 2022-02-24 NOTE — Telephone Encounter (Signed)
Form completed and returned to [place] 

## 2022-02-25 DIAGNOSIS — I872 Venous insufficiency (chronic) (peripheral): Secondary | ICD-10-CM | POA: Diagnosis not present

## 2022-02-25 DIAGNOSIS — G629 Polyneuropathy, unspecified: Secondary | ICD-10-CM | POA: Diagnosis not present

## 2022-02-25 DIAGNOSIS — G4733 Obstructive sleep apnea (adult) (pediatric): Secondary | ICD-10-CM | POA: Diagnosis not present

## 2022-02-25 DIAGNOSIS — K259 Gastric ulcer, unspecified as acute or chronic, without hemorrhage or perforation: Secondary | ICD-10-CM | POA: Diagnosis not present

## 2022-02-25 DIAGNOSIS — G43909 Migraine, unspecified, not intractable, without status migrainosus: Secondary | ICD-10-CM | POA: Diagnosis not present

## 2022-02-25 DIAGNOSIS — G9589 Other specified diseases of spinal cord: Secondary | ICD-10-CM | POA: Diagnosis not present

## 2022-02-25 DIAGNOSIS — M5136 Other intervertebral disc degeneration, lumbar region: Secondary | ICD-10-CM | POA: Diagnosis not present

## 2022-02-25 DIAGNOSIS — E785 Hyperlipidemia, unspecified: Secondary | ICD-10-CM | POA: Diagnosis not present

## 2022-02-25 DIAGNOSIS — G9529 Other cord compression: Secondary | ICD-10-CM | POA: Diagnosis not present

## 2022-02-25 DIAGNOSIS — E538 Deficiency of other specified B group vitamins: Secondary | ICD-10-CM | POA: Diagnosis not present

## 2022-02-25 DIAGNOSIS — Z9181 History of falling: Secondary | ICD-10-CM | POA: Diagnosis not present

## 2022-02-25 DIAGNOSIS — M797 Fibromyalgia: Secondary | ICD-10-CM | POA: Diagnosis not present

## 2022-02-25 DIAGNOSIS — E559 Vitamin D deficiency, unspecified: Secondary | ICD-10-CM | POA: Diagnosis not present

## 2022-02-25 DIAGNOSIS — F32A Depression, unspecified: Secondary | ICD-10-CM | POA: Diagnosis not present

## 2022-02-25 DIAGNOSIS — K760 Fatty (change of) liver, not elsewhere classified: Secondary | ICD-10-CM | POA: Diagnosis not present

## 2022-02-25 DIAGNOSIS — Z993 Dependence on wheelchair: Secondary | ICD-10-CM | POA: Diagnosis not present

## 2022-02-25 DIAGNOSIS — I10 Essential (primary) hypertension: Secondary | ICD-10-CM | POA: Diagnosis not present

## 2022-02-25 DIAGNOSIS — M300 Polyarteritis nodosa: Secondary | ICD-10-CM | POA: Diagnosis not present

## 2022-02-25 DIAGNOSIS — S81811D Laceration without foreign body, right lower leg, subsequent encounter: Secondary | ICD-10-CM | POA: Diagnosis not present

## 2022-02-25 DIAGNOSIS — M4802 Spinal stenosis, cervical region: Secondary | ICD-10-CM | POA: Diagnosis not present

## 2022-02-25 DIAGNOSIS — Z87440 Personal history of urinary (tract) infections: Secondary | ICD-10-CM | POA: Diagnosis not present

## 2022-02-25 DIAGNOSIS — K219 Gastro-esophageal reflux disease without esophagitis: Secondary | ICD-10-CM | POA: Diagnosis not present

## 2022-02-28 ENCOUNTER — Telehealth: Payer: Self-pay | Admitting: Family Medicine

## 2022-02-28 DIAGNOSIS — K760 Fatty (change of) liver, not elsewhere classified: Secondary | ICD-10-CM | POA: Diagnosis not present

## 2022-02-28 DIAGNOSIS — E559 Vitamin D deficiency, unspecified: Secondary | ICD-10-CM | POA: Diagnosis not present

## 2022-02-28 DIAGNOSIS — M4802 Spinal stenosis, cervical region: Secondary | ICD-10-CM | POA: Diagnosis not present

## 2022-02-28 DIAGNOSIS — Z993 Dependence on wheelchair: Secondary | ICD-10-CM | POA: Diagnosis not present

## 2022-02-28 DIAGNOSIS — K219 Gastro-esophageal reflux disease without esophagitis: Secondary | ICD-10-CM | POA: Diagnosis not present

## 2022-02-28 DIAGNOSIS — Z87440 Personal history of urinary (tract) infections: Secondary | ICD-10-CM | POA: Diagnosis not present

## 2022-02-28 DIAGNOSIS — M5136 Other intervertebral disc degeneration, lumbar region: Secondary | ICD-10-CM | POA: Diagnosis not present

## 2022-02-28 DIAGNOSIS — G9529 Other cord compression: Secondary | ICD-10-CM | POA: Diagnosis not present

## 2022-02-28 DIAGNOSIS — M300 Polyarteritis nodosa: Secondary | ICD-10-CM | POA: Diagnosis not present

## 2022-02-28 DIAGNOSIS — E785 Hyperlipidemia, unspecified: Secondary | ICD-10-CM | POA: Diagnosis not present

## 2022-02-28 DIAGNOSIS — I10 Essential (primary) hypertension: Secondary | ICD-10-CM | POA: Diagnosis not present

## 2022-02-28 DIAGNOSIS — Z9181 History of falling: Secondary | ICD-10-CM | POA: Diagnosis not present

## 2022-02-28 DIAGNOSIS — G43909 Migraine, unspecified, not intractable, without status migrainosus: Secondary | ICD-10-CM | POA: Diagnosis not present

## 2022-02-28 DIAGNOSIS — G4733 Obstructive sleep apnea (adult) (pediatric): Secondary | ICD-10-CM | POA: Diagnosis not present

## 2022-02-28 DIAGNOSIS — G629 Polyneuropathy, unspecified: Secondary | ICD-10-CM | POA: Diagnosis not present

## 2022-02-28 DIAGNOSIS — E538 Deficiency of other specified B group vitamins: Secondary | ICD-10-CM | POA: Diagnosis not present

## 2022-02-28 DIAGNOSIS — F32A Depression, unspecified: Secondary | ICD-10-CM | POA: Diagnosis not present

## 2022-02-28 DIAGNOSIS — K259 Gastric ulcer, unspecified as acute or chronic, without hemorrhage or perforation: Secondary | ICD-10-CM | POA: Diagnosis not present

## 2022-02-28 DIAGNOSIS — S81811D Laceration without foreign body, right lower leg, subsequent encounter: Secondary | ICD-10-CM | POA: Diagnosis not present

## 2022-02-28 DIAGNOSIS — M797 Fibromyalgia: Secondary | ICD-10-CM | POA: Diagnosis not present

## 2022-02-28 DIAGNOSIS — G9589 Other specified diseases of spinal cord: Secondary | ICD-10-CM | POA: Diagnosis not present

## 2022-02-28 DIAGNOSIS — I872 Venous insufficiency (chronic) (peripheral): Secondary | ICD-10-CM | POA: Diagnosis not present

## 2022-02-28 NOTE — Telephone Encounter (Signed)
Placed forms in Dr [name] to be signed folder once signed I will fax back  

## 2022-02-28 NOTE — Telephone Encounter (Signed)
Received Home Health form from [facility]; placed in Dr. Virgil Benedict bin.

## 2022-03-01 DIAGNOSIS — G4733 Obstructive sleep apnea (adult) (pediatric): Secondary | ICD-10-CM | POA: Diagnosis not present

## 2022-03-01 DIAGNOSIS — K259 Gastric ulcer, unspecified as acute or chronic, without hemorrhage or perforation: Secondary | ICD-10-CM | POA: Diagnosis not present

## 2022-03-01 DIAGNOSIS — K760 Fatty (change of) liver, not elsewhere classified: Secondary | ICD-10-CM | POA: Diagnosis not present

## 2022-03-01 DIAGNOSIS — S81811D Laceration without foreign body, right lower leg, subsequent encounter: Secondary | ICD-10-CM | POA: Diagnosis not present

## 2022-03-01 DIAGNOSIS — M797 Fibromyalgia: Secondary | ICD-10-CM | POA: Diagnosis not present

## 2022-03-01 DIAGNOSIS — G43909 Migraine, unspecified, not intractable, without status migrainosus: Secondary | ICD-10-CM | POA: Diagnosis not present

## 2022-03-01 DIAGNOSIS — G629 Polyneuropathy, unspecified: Secondary | ICD-10-CM | POA: Diagnosis not present

## 2022-03-01 DIAGNOSIS — M300 Polyarteritis nodosa: Secondary | ICD-10-CM | POA: Diagnosis not present

## 2022-03-01 DIAGNOSIS — G9529 Other cord compression: Secondary | ICD-10-CM | POA: Diagnosis not present

## 2022-03-01 DIAGNOSIS — I10 Essential (primary) hypertension: Secondary | ICD-10-CM | POA: Diagnosis not present

## 2022-03-01 DIAGNOSIS — I872 Venous insufficiency (chronic) (peripheral): Secondary | ICD-10-CM | POA: Diagnosis not present

## 2022-03-01 DIAGNOSIS — F32A Depression, unspecified: Secondary | ICD-10-CM | POA: Diagnosis not present

## 2022-03-01 DIAGNOSIS — Z9181 History of falling: Secondary | ICD-10-CM | POA: Diagnosis not present

## 2022-03-01 DIAGNOSIS — Z993 Dependence on wheelchair: Secondary | ICD-10-CM | POA: Diagnosis not present

## 2022-03-01 DIAGNOSIS — G9589 Other specified diseases of spinal cord: Secondary | ICD-10-CM | POA: Diagnosis not present

## 2022-03-01 DIAGNOSIS — E538 Deficiency of other specified B group vitamins: Secondary | ICD-10-CM | POA: Diagnosis not present

## 2022-03-01 DIAGNOSIS — E785 Hyperlipidemia, unspecified: Secondary | ICD-10-CM | POA: Diagnosis not present

## 2022-03-01 DIAGNOSIS — M4802 Spinal stenosis, cervical region: Secondary | ICD-10-CM | POA: Diagnosis not present

## 2022-03-01 DIAGNOSIS — K219 Gastro-esophageal reflux disease without esophagitis: Secondary | ICD-10-CM | POA: Diagnosis not present

## 2022-03-01 DIAGNOSIS — Z87440 Personal history of urinary (tract) infections: Secondary | ICD-10-CM | POA: Diagnosis not present

## 2022-03-01 DIAGNOSIS — E559 Vitamin D deficiency, unspecified: Secondary | ICD-10-CM | POA: Diagnosis not present

## 2022-03-01 DIAGNOSIS — M5136 Other intervertebral disc degeneration, lumbar region: Secondary | ICD-10-CM | POA: Diagnosis not present

## 2022-03-01 IMAGING — DX DG CHEST 1V PORT
1 series · 1 of 1 positions shown · non-contrast
Comparison: July 26, 2019

CLINICAL DATA: Weakness.

EXAM:
PORTABLE CHEST 1 VIEW

[chest ap]
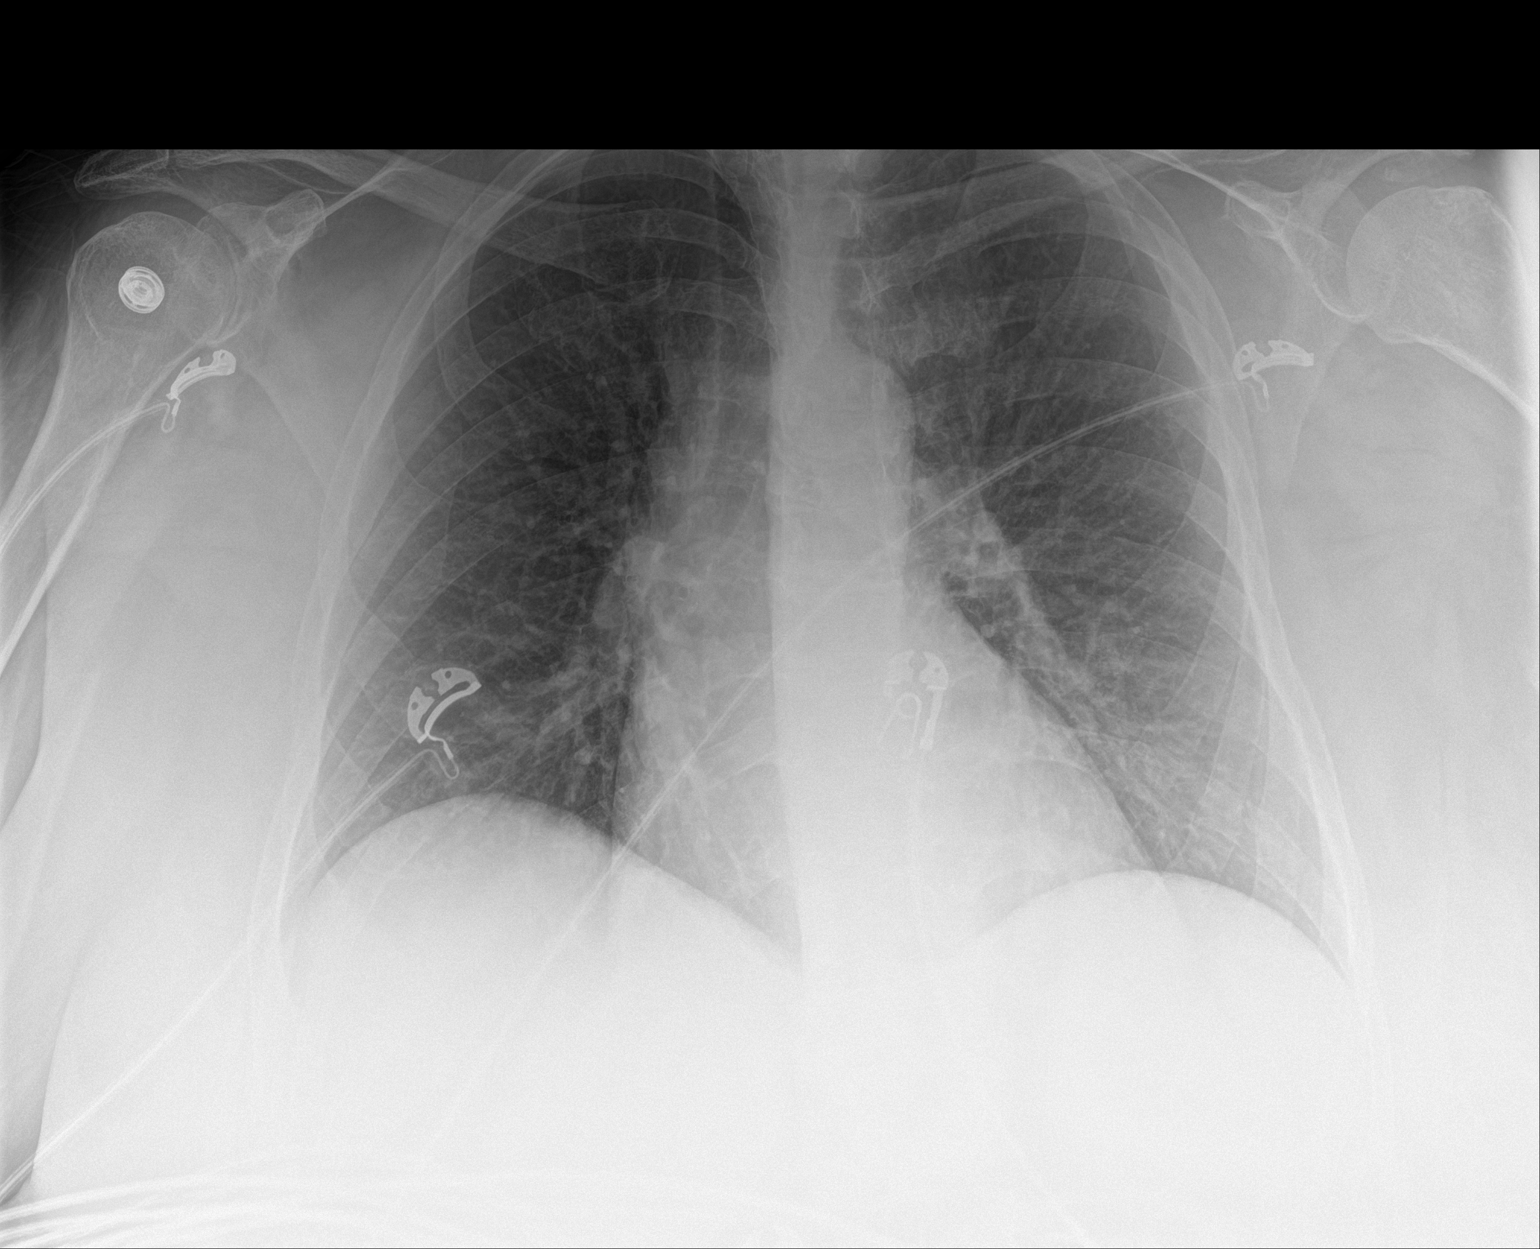

[1 of 1 positions shown; findings below may reference images not displayed]

FINDINGS: The heart size and mediastinal contours are within normal limits.
Both lungs are clear. The visualized skeletal structures are
unremarkable.
IMPRESSION: No active disease.

## 2022-03-01 IMAGING — MR MR HEAD W/O CM
12 of 13 series · 44 of 48 positions shown · non-contrast
Comparison: Prior CT from earlier the same day.

CLINICAL DATA: Initial evaluation for generalized weakness.

EXAM:
MRI HEAD WITHOUT CONTRAST
TECHNIQUE: Multiplanar, multiecho pulse sequences of the brain and surrounding
structures were obtained without intravenous contrast.

[Series 9: DWI · axial · 3.0mm · 0.88mm/px · z∈[-17,+131]mm · 8 of 102 slices shown (1 of 4)]
[im 1/102]
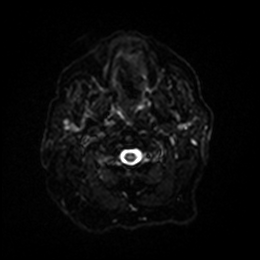
[im 15/102]
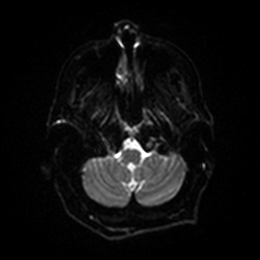
[im 29/102]
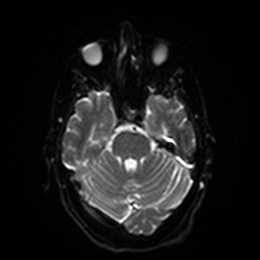
[im 44/102]
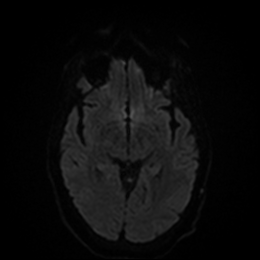
[im 58/102]
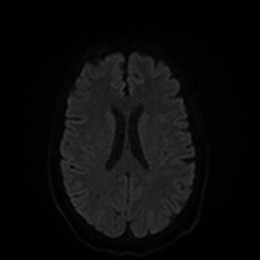
[im 73/102]
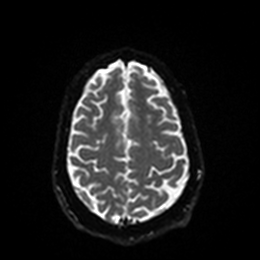
[im 87/102]
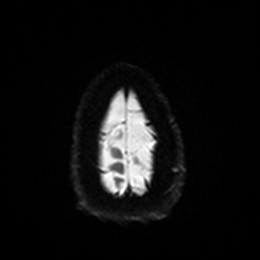
[im 102/102]
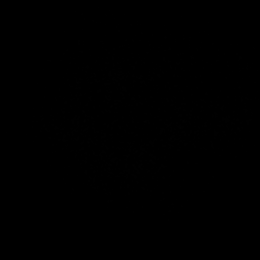

[Series 10: DWI · axial · 3.0mm · 0.88mm/px · z∈[-17,+125]mm · 4 of 49 slices shown (2 of 4)]
[im 1/49]
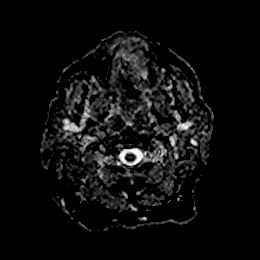
[im 17/49]
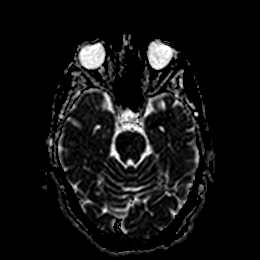
[im 33/49]
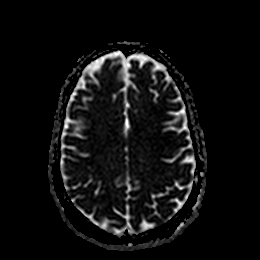
[im 49/49]
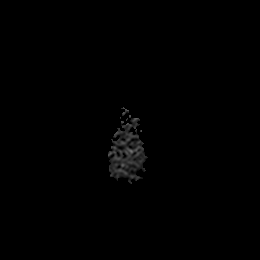

[Series 11: DWI · coronal · 4.0mm · 0.88mm/px · 5 of 72 slices shown (3 of 4)]
[im 1/72]
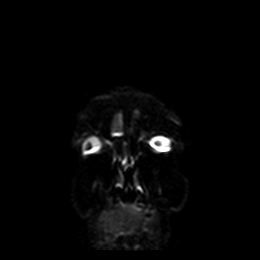
[im 18/72]
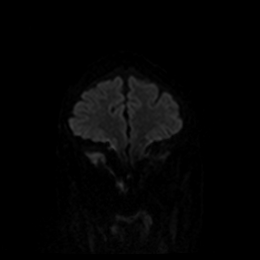
[im 36/72]
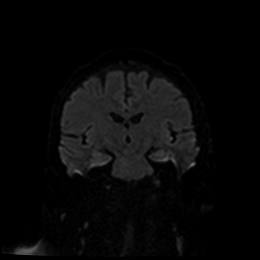
[im 54/72]
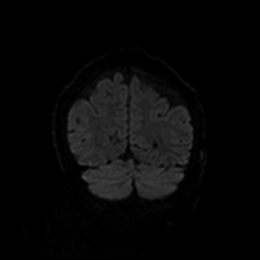
[im 72/72]
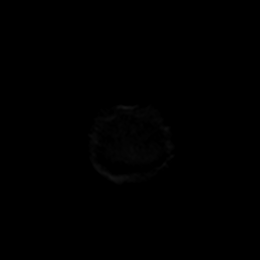

[Series 12: DWI · coronal · 4.0mm · 0.88mm/px · 3 of 35 slices shown (4 of 4)]
[im 1/35]
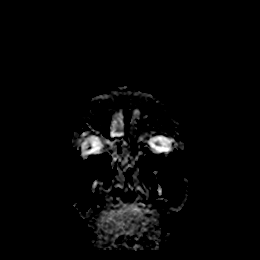
[im 18/35]
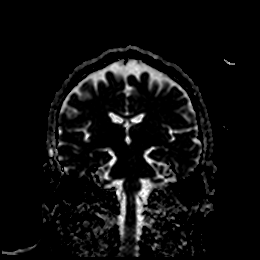
[im 35/35]
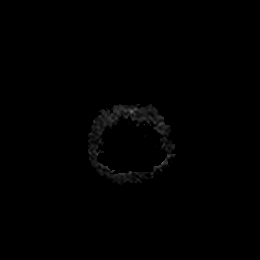

[Series 13: T1 · sagittal · 5.0mm · 0.75mm/px · 2 of 23 slices shown]
[im 1/23]
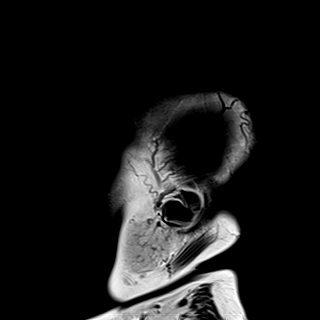
[im 23/23]
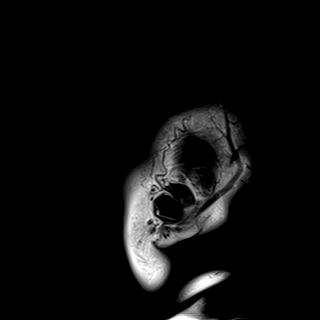

[Series 14: T2 · axial · 5.0mm · 0.72mm/px · z∈[-13,+127]mm · 2 of 25 slices shown (1 of 2)]
[im 1/25]
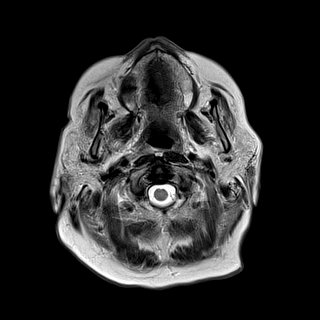
[im 25/25]
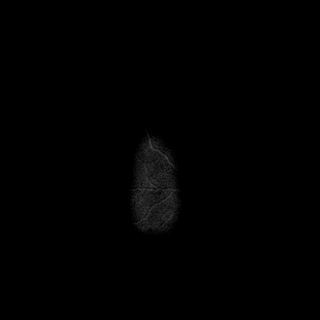

[Series 15: FLAIR · axial · 5.0mm · 0.45mm/px · z∈[-10,+130]mm · 2 of 25 slices shown]
[im 1/25]
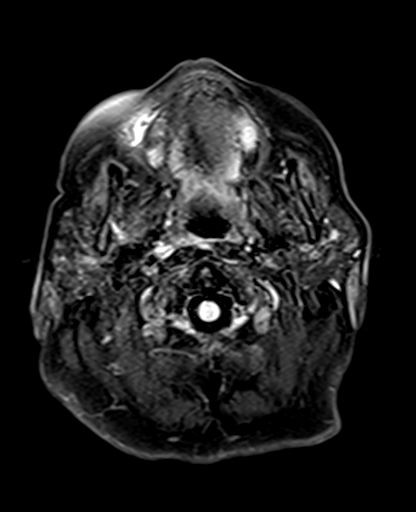
[im 25/25]
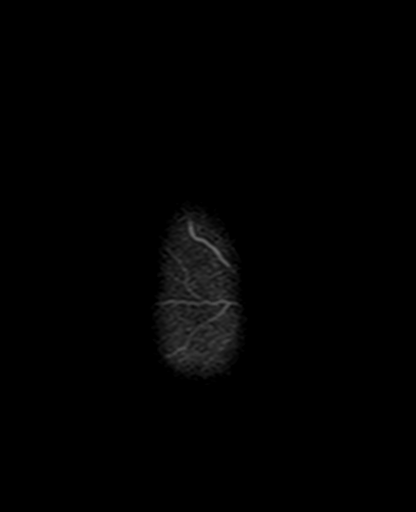

[Series 16: mag_images · axial · 3.0mm · 0.90mm/px · z∈[-20,+140]mm · 4 of 56 slices shown]
[im 1/56]
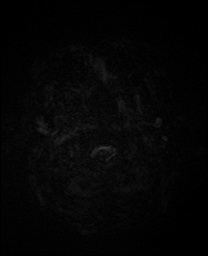
[im 19/56]
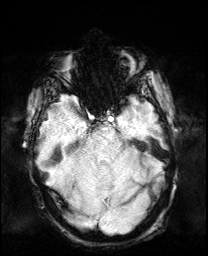
[im 37/56]
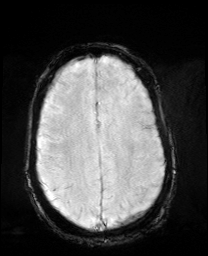
[im 56/56]
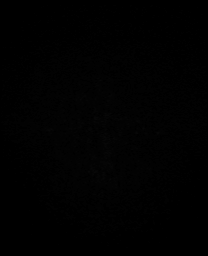

[Series 17: pha_images · axial · 3.0mm · 0.90mm/px · z∈[-20,+128]mm · 4 of 52 slices shown]
[im 1/52]
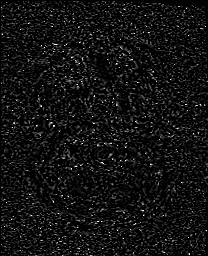
[im 18/52]
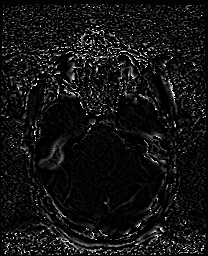
[im 35/52]
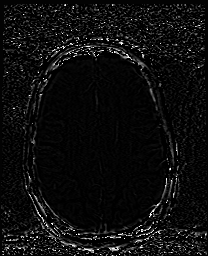
[im 52/52]
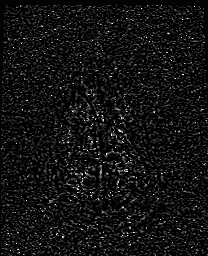

[Series 18: swi_images · axial · 3.0mm · 0.90mm/px · z∈[-20,+140]mm · 4 of 56 slices shown]
[im 1/56]
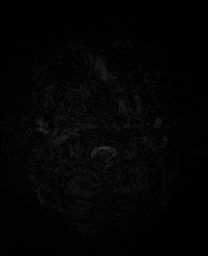
[im 19/56]
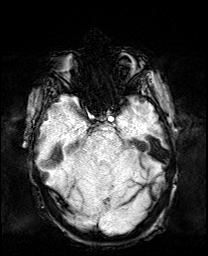
[im 37/56]
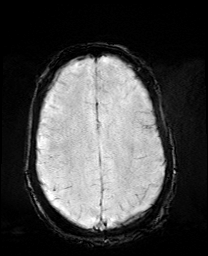
[im 56/56]
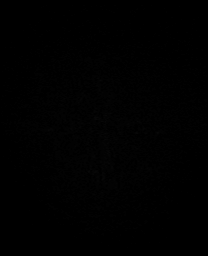

[Series 19: mip_images(sw) · axial · 24.0mm · 0.90mm/px · z∈[-10,+130]mm · 4 of 49 slices shown]
[im 1/49]
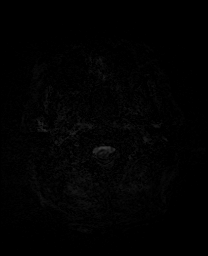
[im 17/49]
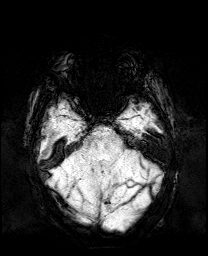
[im 33/49]
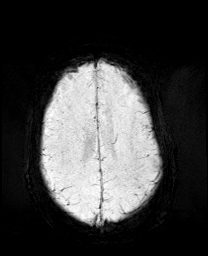
[im 49/49]
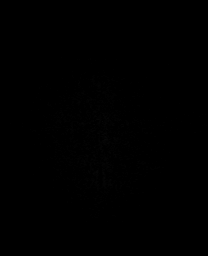

[Series 21: T2 · coronal · 5.0mm · 0.34mm/px · 2 of 29 slices shown (2 of 2)]
[im 1/29]
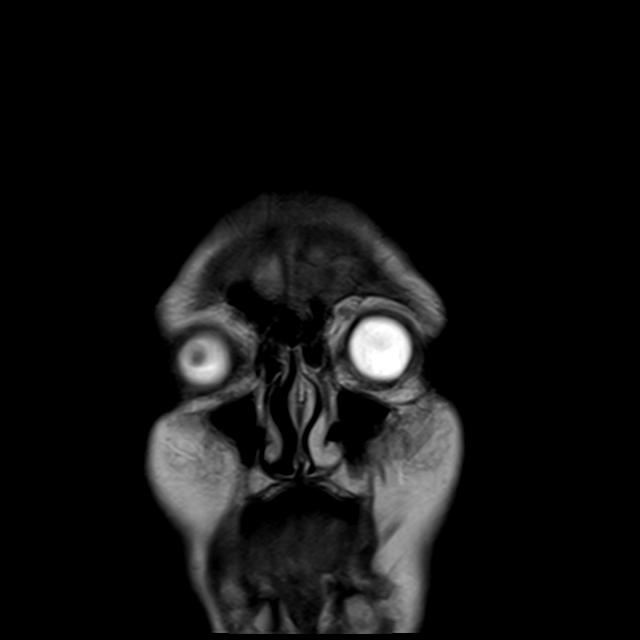
[im 29/29]
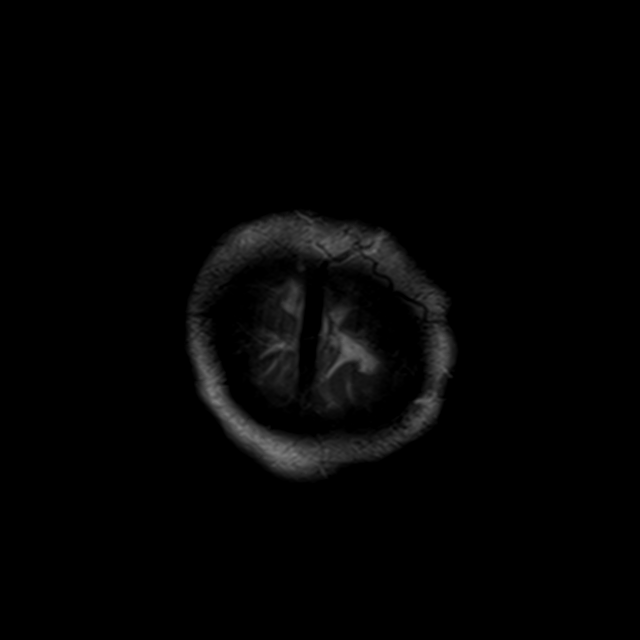

[44 of 48 positions shown; findings below may reference images not displayed]

FINDINGS: Brain: Cerebral volume within normal limits for age. No focal
parenchymal signal abnormality or significant cerebral white matter
disease. No evidence for acute or subacute infarct. Gray-white
matter differentiation maintained. No encephalomalacia suggest
chronic cortical infarction. No acute or chronic intracranial blood
products.

No mass lesion, midline shift or mass effect. No hydrocephalus or
extra-axial fluid collection. Pituitary gland suprasellar region
normal. Midline structures intact and normal.

Vascular: Major intracranial vascular flow voids are maintained.

Skull and upper cervical spine: Craniocervical junction within
normal limits. Bone marrow signal intensity diffusely decreased on
T1 weighted sequence, nonspecific, but most commonly related to
anemia, smoking or obesity. No focal marrow replacing lesion. No
scalp soft tissue abnormality.

Sinuses/Orbits: Globes and orbital soft tissues within normal
limits. Paranasal sinuses are largely clear. No mastoid effusion.

Other: None.
IMPRESSION: Normal brain MRI for age.  No acute intracranial abnormality.

## 2022-03-01 IMAGING — MR MR CERVICAL SPINE WO/W CM
5 of 8 series · 26 of 48 positions shown · IV contrast (Eovist)
Comparison: Comparison made with prior CT from earlier the same
day.

CLINICAL DATA: Initial evaluation for neck pain, weakness.

EXAM:
MRI CERVICAL SPINE WITHOUT AND WITH CONTRAST
TECHNIQUE: Multiplanar and multiecho pulse sequences of the cervical spine, to
include the craniocervical junction and cervicothoracic junction,
were obtained without and with intravenous contrast.
CONTRAST:  10mL GADAVIST GADOBUTROL 1 MMOL/ML IV SOLN

[Series 9: T2 · sagittal · 3.0mm · 0.69mm/px · 3 of 15 slices shown (1 of 2)]
[im 1/15]
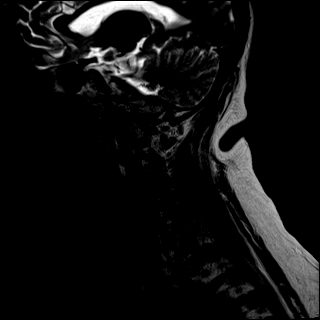
[im 8/15]
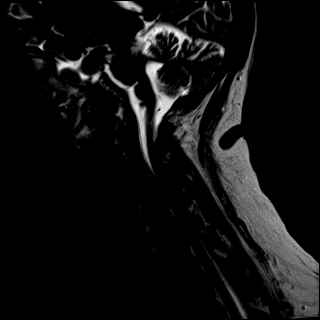
[im 15/15]
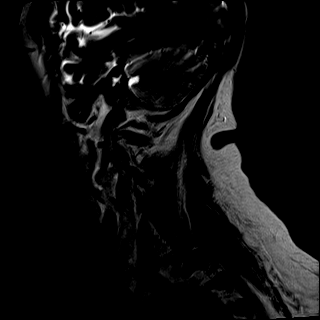

[Series 10: T1 · sagittal · 3.0mm · 0.69mm/px · 3 of 15 slices shown (1 of 2)]
[im 1/15]
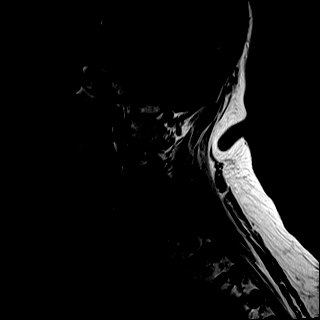
[im 8/15]
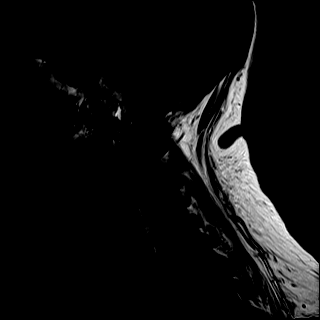
[im 15/15]
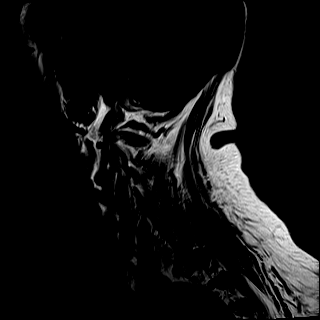

[Series 12: T2 · axial · 3.0mm · 0.66mm/px · z∈[-142,-26]mm · 9 of 40 slices shown (2 of 2)]
[im 1/40]
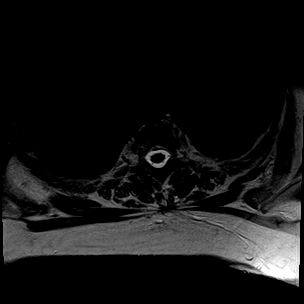
[im 5/40]
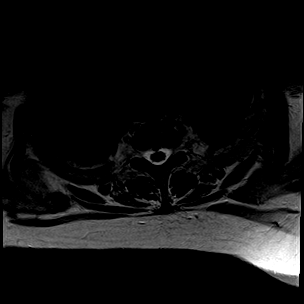
[im 10/40]
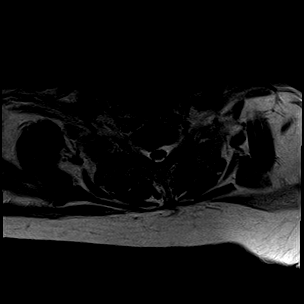
[im 15/40]
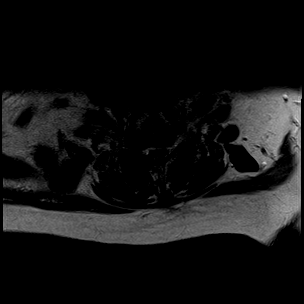
[im 20/40]
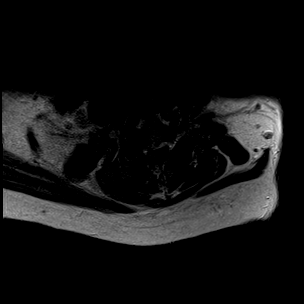
[im 25/40]
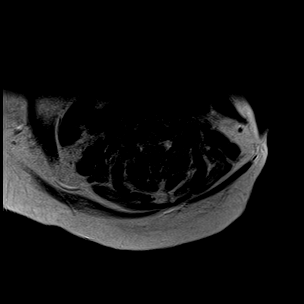
[im 30/40]
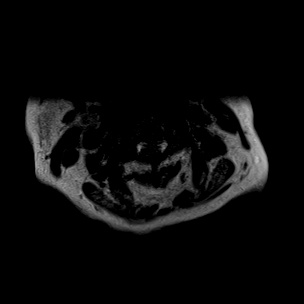
[im 35/40]
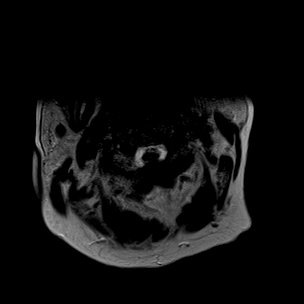
[im 40/40]
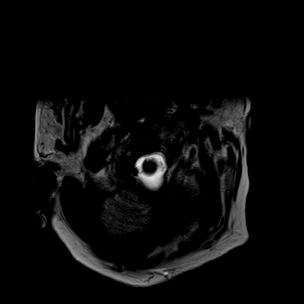

[Series 14: T1 · axial · 3.0mm · 0.39mm/px · z∈[-142,-26]mm · 9 of 40 slices shown (2 of 2)]
[im 1/40]
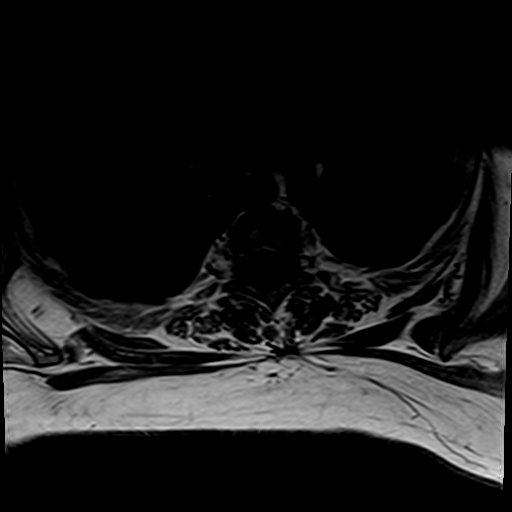
[im 5/40]
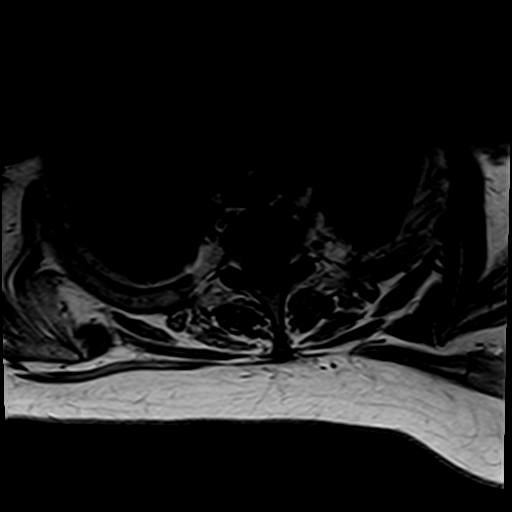
[im 10/40]
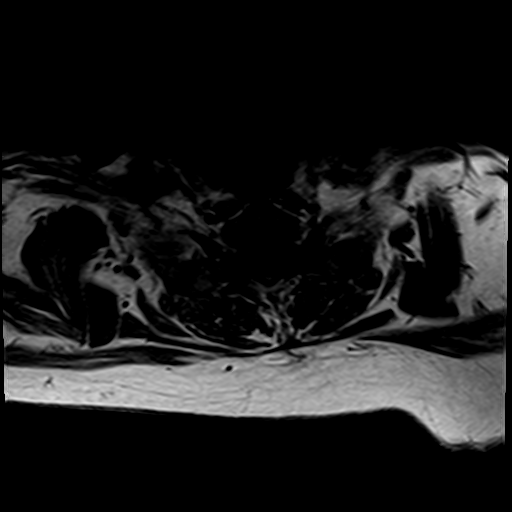
[im 15/40]
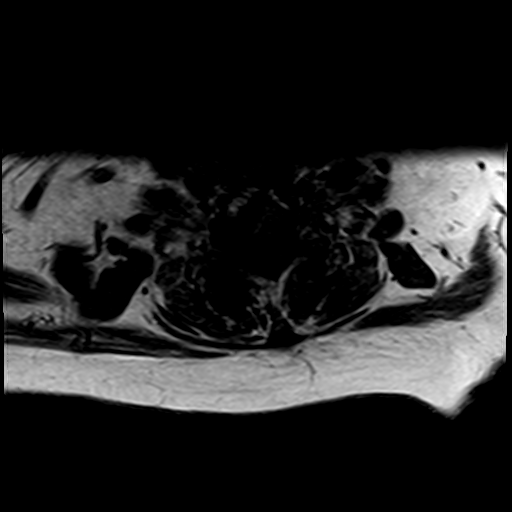
[im 20/40]
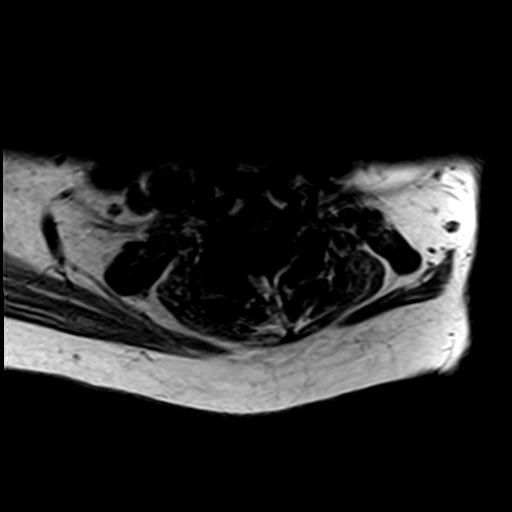
[im 25/40]
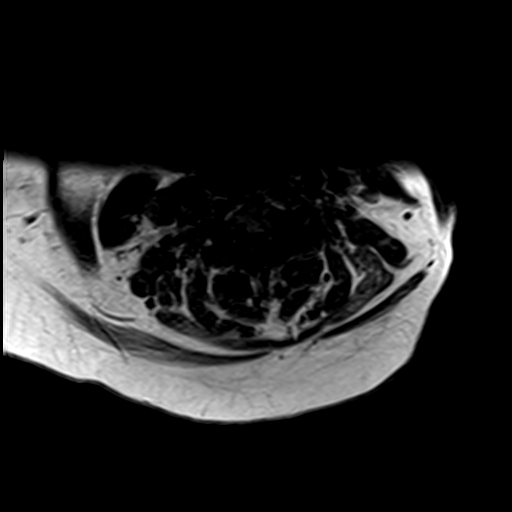
[im 30/40]
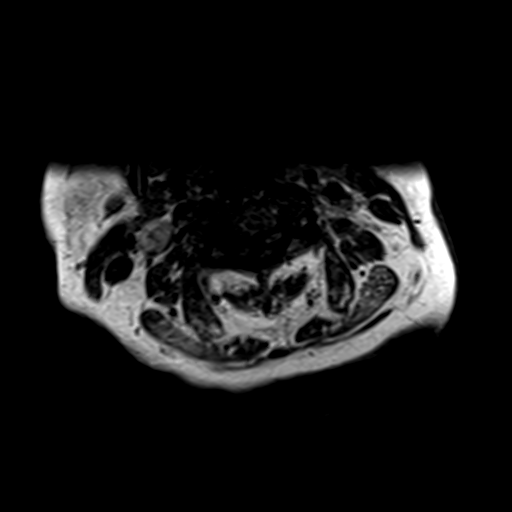
[im 35/40]
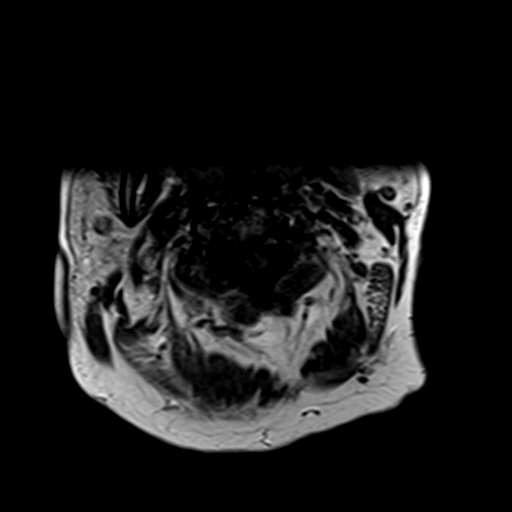
[im 40/40]
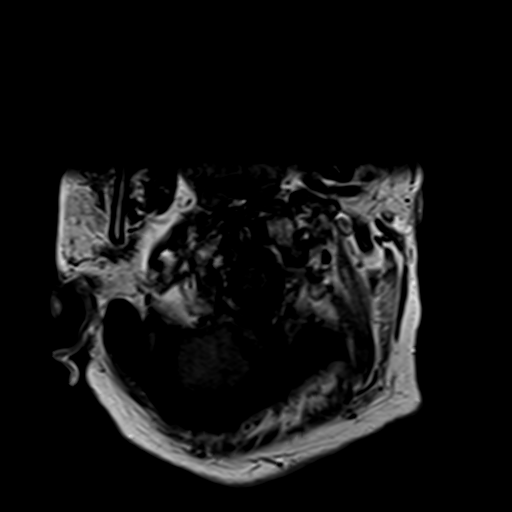

[Series 15: T1 fat-sat post-contrast · sagittal · 3.0mm · 0.43mm/px · 2 of 15 slices shown]
[im 1/15]
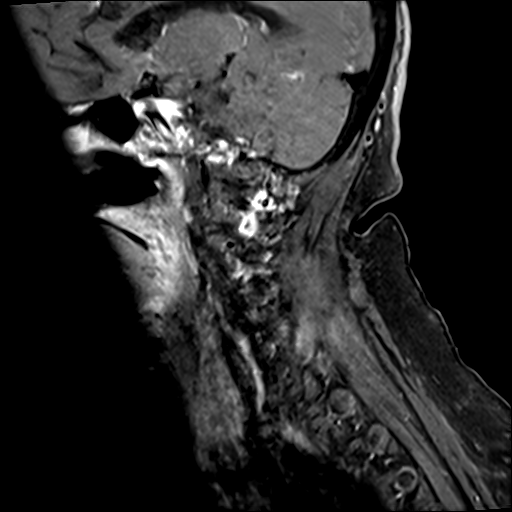
[im 8/15]
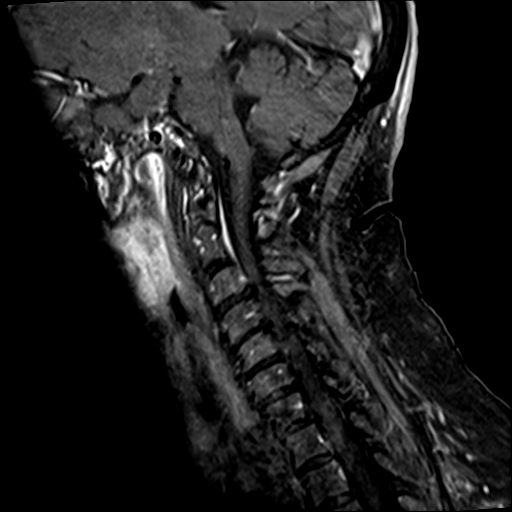

[26 of 48 positions shown; findings below may reference images not displayed]

FINDINGS: Alignment: Examination somewhat degraded by motion artifact.

Straightening of the normal cervical lordosis. No listhesis or
static subluxation.

Vertebrae: Vertebral body height maintained without acute or chronic
fracture. Bone marrow signal intensity mildly decreased on T1
weighted imaging, nonspecific, but most commonly related to anemia,
smoking, or obesity. No discrete or worrisome osseous lesions. Mild
reactive marrow edema present about the right C5-6 facet due to
facet arthritis (series 11, image 2). No other abnormal marrow
edema.

Cord: Suspected subtle patchy cord signal abnormality at the level
of C3-4, suspicious for compressive myelomalacia (series 11, image
7). No other convincing cord signal changes on this motion degraded
exam. No abnormal enhancement.

Posterior Fossa, vertebral arteries, paraspinal tissues:
Craniocervical junction within normal limits. Localized edema within
the posterior paraspinous soft tissues adjacent to the inflamed
right C5-6 facet. Paraspinous soft tissues demonstrate no other
acute finding. Normal flow voids seen within the vertebral arteries
bilaterally.

Disc levels:

C2-C3: Negative interspace. Mild bilateral facet hypertrophy. No
significant canal or foraminal stenosis.

C3-C4: Diffuse disc bulge with endplate and uncovertebral spurring.
Superimposed lobulated central to left paracentral disc protrusion
flattens and effaces the ventral thecal sac (series 13, image 13).
Protruding disc impinges upon the cervical spinal cord which is
markedly flattened. Suspected subtle cord signal changes as above,
most concerning for myelomalacia. Resultant severe spinal stenosis
with near complete effacement of the thecal sac on the left. Severe
left worse than right C4 foraminal stenosis.

C4-C5: Degenerative intervertebral disc space narrowing with diffuse
bulge with endplate and uncovertebral spurring. Central to right
paracentral disc osteophyte complex indents the ventral thecal sac
(series 13, image 18). Secondary flattening and compression of the
right hemi cord without definite cord signal changes. Severe spinal
stenosis, worse on the right. Severe right with moderate left C5
foraminal stenosis.

C5-C6: Degenerative intervertebral disc space narrowing with diffuse
disc osteophyte complex. Superimposed broad right paracentral
component indents and largely effaces the ventral thecal sac (series
13, image 23). Secondary cord flattening without definite cord
signal changes. Severe right-sided spinal stenosis with the thecal
sac measuring 3 mm in AP diameter. Moderate right worse than left C5
foraminal stenosis.

C6-C7: Degenerative intervertebral disc space narrowing with diffuse
disc osteophyte complex. Broad left paracentral component indents
and effaces the ventral thecal sac (series 13, image 27). Secondary
flattening of the left hemi cord without cord signal changes.
Moderate to severe spinal stenosis. Moderate right worse than left
C7 foraminal narrowing.

C7-T1: Right paracentral disc extrusion indents the right ventral
thecal sac (series 12, image 31). Associated slight superior
migration (series 9, image 7). Mild right-sided spinal stenosis.
Superimposed mild facet hypertrophy. Foramina remain patent.

Visualized upper thoracic spine demonstrates no significant finding.
IMPRESSION: 1. Advanced for age cervical spondylosis with resultant moderate to
severe diffuse spinal stenosis at C3-4 through C6-7. Associated
moderate to severe C4 through C7 foraminal stenosis as above.
2. Suspected subtle patchy cord signal abnormality at the level of
C3-4, suspicious for compressive myelomalacia. Neuro surgical
consultation recommended.
3. Mild reactive marrow edema about the right C5-6 facet due to
facet arthritis. Finding could serve as a source for neck pain.

These results were communicated to Dr. Franco Martin at [DATE] on
06/05/2021 by text page via the [employer] messaging system.

## 2022-03-02 ENCOUNTER — Telehealth (INDEPENDENT_AMBULATORY_CARE_PROVIDER_SITE_OTHER): Payer: Medicare Other | Admitting: Family Medicine

## 2022-03-02 ENCOUNTER — Encounter: Payer: Self-pay | Admitting: Family Medicine

## 2022-03-02 VITALS — Temp 97.6°F | Ht 62.5 in | Wt 302.0 lb

## 2022-03-02 DIAGNOSIS — B9689 Other specified bacterial agents as the cause of diseases classified elsewhere: Secondary | ICD-10-CM | POA: Diagnosis not present

## 2022-03-02 DIAGNOSIS — J329 Chronic sinusitis, unspecified: Secondary | ICD-10-CM | POA: Diagnosis not present

## 2022-03-02 MED ORDER — DOXYCYCLINE HYCLATE 100 MG PO TABS: 100.0000 mg | ORAL_TABLET | Freq: Two times a day (BID) | ORAL | 0 refills | Status: DC

## 2022-03-02 NOTE — Progress Notes (Signed)
Virtual Visit via Video   I connected with patient on 03/02/22 at  1:00 PM EDT by a video enabled telemedicine application and verified that I am speaking with the correct person using two identifiers.  Location patient: Home Location provider: [facility] Office Persons participating in the virtual visit: Patient, Provider, Kilbourne Langley Gauss M)  I discussed the limitations of evaluation and management by telemedicine and the availability of in person appointments. The patient expressed understanding and agreed to proceed.  Subjective:   HPI:   URI- complains of sinus congestion, chest congestion.  No relief w/ Mucinex.  Sxs started 1 week ago.  + facial pain/pressure.  Cough is dry, not productive.  No fevers or chills.  Some nausea, no vomiting.  + HA.  No ear pain.    ROS:   See pertinent positives and negatives per HPI.  Patient Active Problem List   Diagnosis Date Noted   Leg cramps 01/12/2022   Positive blood cultures 08/14/2021   PNA (pneumonia) 08/12/2021   Fever of unknown origin 08/12/2021   Sacral pressure sore 07/06/2021   Vitamin B6 deficiency 06/08/2021   Myelomalacia of cervical cord ([place]) 06/06/2021   Compressive cervical cord myelomalacia ([place]) 06/05/2021   B12 deficiency 06/05/2021   Folate deficiency 06/05/2021   Vitamin D deficiency 06/05/2021   Leg weakness, bilateral 06/04/2021   Cervical atypia 01/06/2021   Steatosis of liver 01/06/2021   Hyponatremia 07/27/2019   Urinary urgency 04/04/2018   Chronic narcotic use 09/07/2016   Acute blood loss anemia 01/14/2016   Multiple gastric ulcers    PAN (polyarteritis nodosa) ([place]) 11/24/2015   GERD (gastroesophageal reflux disease) 01/08/2014   Routine general medical examination at a health care facility 04/23/2013   Bariatric surgery status 10/31/2012   S/P skin biopsy 10/31/2012   Psoriasis 01/09/2012   DDD (degenerative disc disease), lumbar 11/30/2011   Migraine headache    [name] (obstructive  sleep apnea) 11/16/2010   HYPERGLYCEMIA, FASTING 10/08/2009   CONTRACTURE OF TENDON 08/17/2009   PARESTHESIA, HANDS 12/23/2008   Leg pain, right 11/05/2008   ADVERSE DRUG REACTION 04/03/2008   PERIPHERAL EDEMA 03/26/2008   Obesity, Class III, BMI 40-49.9 (morbid obesity) ([place]) 01/03/2007   Cellulitis 01/03/2007   Hyperlipidemia 09/22/2006   Fibromyalgia 09/20/2006   Anxiety and depression 06/28/2006   Essential hypertension 06/28/2006    Social History   Tobacco Use   Smoking status: Never   Smokeless tobacco: Never  Substance Use Topics   Alcohol use: No    Current Outpatient Medications:    ALPRAZolam (XANAX) 0.5 MG tablet, TAKE 1 TABLET BY MOUTH 2 TIMES DAILY, Disp: 30 tablet, Rfl: 3   ARIPiprazole (ABILIFY) 15 MG tablet, TAKE 1 TABLET BY MOUTH EVERY DAY, Disp: 90 tablet, Rfl: 1   Armodafinil 250 MG tablet, Take 250 mg by mouth in the morning and at bedtime., Disp: , Rfl:    Biotin 10 MG TABS, Take 10 mg by mouth in the morning. , Disp: , Rfl:    buPROPion (WELLBUTRIN XL) 300 MG 24 hr tablet, TAKE 1 TABLET BY MOUTH EVERY DAY, Disp: 30 tablet, Rfl: 2   calcium citrate (CALCITRATE - DOSED IN MG ELEMENTAL CALCIUM) 950 MG tablet, Take 1 tablet by mouth daily., Disp: , Rfl:    clonazePAM (KLONOPIN) 1 MG tablet, TAKE 1 TABLET BY MOUTH AT BEDTIME, Disp: 30 tablet, Rfl: 3   cyanocobalamin (VITAMIN B12) 1000 MCG tablet, TAKE 1 TABLET BY MOUTH EVERY DAY, Disp: 30 tablet, Rfl: 2  cyclobenzaprine (FLEXERIL) 10 MG tablet, TAKE 2 TABLETS BY MOUTH 2 TIMES DAILY, Disp: 120 tablet, Rfl: 2   diclofenac Sodium (VOLTAREN) 1 % GEL, Apply 4 g topically 4 (four) times daily., Disp: , Rfl:    diphenhydrAMINE (BENADRYL) 25 MG tablet, Take 25 mg by mouth 2 (two) times daily as needed for allergies., Disp: , Rfl:    DULoxetine (CYMBALTA) 60 MG capsule, Take 2 capsules (120 mg total) by mouth daily. (Patient taking differently: Take 60 mg by mouth 2 (two) times daily.), Disp: 240 capsule, Rfl: 2    FEROSUL 325 (65 Fe) MG tablet, TAKE 1 TABLET BY MOUTH EVERY DAY WITH BREAKFAST, Disp: 30 tablet, Rfl: 2   folic acid (FOLVITE) 1 MG tablet, TAKE 1 TABLET BY MOUTH EVERY DAY, Disp: 30 tablet, Rfl: 2   gabapentin (NEURONTIN) 600 MG tablet, Take 1 tablet (600 mg total) by mouth 3 (three) times daily. TAKE 2 TABLETS BY MOUTH 3 TIMES DAILY (Patient taking differently: Take 600 mg by mouth 3 (three) times daily.), Disp: 270 tablet, Rfl: 0   GNP VITAMIN B-1 100 MG tablet, TAKE 1 TABLET BY MOUTH EVERY DAY, Disp: 30 tablet, Rfl: 2   HYDROcodone-acetaminophen (NORCO) 10-325 MG tablet, TAKE 1 TABLET BY MOUTH EVERY 6 HOURS AS NEEDED, Disp: 90 tablet, Rfl: 0   hydrOXYzine (VISTARIL) 25 MG capsule, TAKE 1 CAPSULE BY MOUTH EVERY 8 HOURS AS NEEDED, Disp: 60 capsule, Rfl: 0   nebivolol (BYSTOLIC) 10 MG tablet, TAKE 1 TABLET BY MOUTH EVERY DAY, Disp: 30 tablet, Rfl: 2   nystatin (MYCOSTATIN/NYSTOP) powder, APPLY 1 APPLICATION TOPICALLY 3 TIMES DAILY, Disp: 60 g, Rfl: 3   Oxcarbazepine (TRILEPTAL) 300 MG tablet, Take 300 mg by mouth 2 (two) times daily., Disp: , Rfl:    pantoprazole (PROTONIX) 40 MG tablet, TAKE 1 TABLET BY MOUTH 2 TIMES DAILY, Disp: 60 tablet, Rfl: 2   phentermine 37.5 MG capsule, Take 1 capsule (37.5 mg total) by mouth every morning., Disp: 30 capsule, Rfl: 2   promethazine (PHENERGAN) 25 MG tablet, TAKE 1 TABLET BY MOUTH EVERY 6 HOURS AS NEEDED FOR NAUSEA AND VOMITING, Disp: 45 tablet, Rfl: 3   pyridOXINE (VITAMIN B6) 100 MG tablet, TAKE 1 TABLET BY MOUTH EVERY DAY, Disp: 30 tablet, Rfl: 2   rosuvastatin (CRESTOR) 20 MG tablet, TAKE 1 TABLET BY MOUTH EVERY DAY, Disp: 90 tablet, Rfl: 2   SKYRIZI 150 MG/ML SOSY, Inject 150 mg into the skin every 3 (three) months., Disp: , Rfl:   Allergies  Allergen Reactions   Niacin Anaphylaxis, Shortness Of Breath and Swelling    Swelling and "problems breathing"   Sulfamethoxazole-Trimethoprim Anaphylaxis, Swelling, Rash and Other (See Comments)    Throat  closed and the eyes became swollen   Aspirin Hives   Bactrim Hives   Benzoin Compound Rash   Cephalexin Hives    Tolerated ceftriaxone and cefepime 07/2019   Iohexol Hives    Pt treated with PO benedryl   Lisinopril Hives   Naproxen Hives   Pnu-Imune [Pneumococcal Polysaccharide Vaccine] Rash   Sulfonamide Derivatives Rash    Objective:   Temp 97.6 F (36.4 C) (Temporal) Comment: pt reported  Ht 5' 2.5" (1.588 m)   Wt (!) 302 lb (137 kg) Comment: patient report  BMI 54.36 kg/m  AAOx3, NAD NCAT, EOMI No obvious CN deficits Coloring WNL Pt is able to speak clearly, coherently without shortness of breath or increased work of breathing.  Thought process is linear.  Mood is appropriate.  Assessment and Plan:   Bacterial sinusitis- pt's sxs are consistent w/ previous episodes.  Due to multiple medication allergies will treat w/ Doxycycline.  Reviewed supportive care and red flags that should prompt return.  Pt expressed understanding and is in agreement w/ plan.    [name], MD 03/02/2022

## 2022-03-03 ENCOUNTER — Other Ambulatory Visit: Payer: Self-pay | Admitting: Family Medicine

## 2022-03-03 NOTE — Telephone Encounter (Signed)
Forms have been faxed back and placed in scan

## 2022-03-03 NOTE — Telephone Encounter (Signed)
Form completed and returned to [place] 

## 2022-03-04 ENCOUNTER — Other Ambulatory Visit: Payer: Self-pay | Admitting: Family Medicine

## 2022-03-04 ENCOUNTER — Encounter: Payer: Self-pay | Admitting: Family Medicine

## 2022-03-04 DIAGNOSIS — Z993 Dependence on wheelchair: Secondary | ICD-10-CM | POA: Diagnosis not present

## 2022-03-04 DIAGNOSIS — G4733 Obstructive sleep apnea (adult) (pediatric): Secondary | ICD-10-CM | POA: Diagnosis not present

## 2022-03-04 DIAGNOSIS — K760 Fatty (change of) liver, not elsewhere classified: Secondary | ICD-10-CM | POA: Diagnosis not present

## 2022-03-04 DIAGNOSIS — G9589 Other specified diseases of spinal cord: Secondary | ICD-10-CM | POA: Diagnosis not present

## 2022-03-04 DIAGNOSIS — F32A Depression, unspecified: Secondary | ICD-10-CM | POA: Diagnosis not present

## 2022-03-04 DIAGNOSIS — G629 Polyneuropathy, unspecified: Secondary | ICD-10-CM | POA: Diagnosis not present

## 2022-03-04 DIAGNOSIS — M300 Polyarteritis nodosa: Secondary | ICD-10-CM | POA: Diagnosis not present

## 2022-03-04 DIAGNOSIS — M5136 Other intervertebral disc degeneration, lumbar region: Secondary | ICD-10-CM | POA: Diagnosis not present

## 2022-03-04 DIAGNOSIS — E785 Hyperlipidemia, unspecified: Secondary | ICD-10-CM | POA: Diagnosis not present

## 2022-03-04 DIAGNOSIS — I10 Essential (primary) hypertension: Secondary | ICD-10-CM | POA: Diagnosis not present

## 2022-03-04 DIAGNOSIS — S81811D Laceration without foreign body, right lower leg, subsequent encounter: Secondary | ICD-10-CM | POA: Diagnosis not present

## 2022-03-04 DIAGNOSIS — K219 Gastro-esophageal reflux disease without esophagitis: Secondary | ICD-10-CM | POA: Diagnosis not present

## 2022-03-04 DIAGNOSIS — I872 Venous insufficiency (chronic) (peripheral): Secondary | ICD-10-CM | POA: Diagnosis not present

## 2022-03-04 DIAGNOSIS — E559 Vitamin D deficiency, unspecified: Secondary | ICD-10-CM | POA: Diagnosis not present

## 2022-03-04 DIAGNOSIS — M797 Fibromyalgia: Secondary | ICD-10-CM | POA: Diagnosis not present

## 2022-03-04 DIAGNOSIS — Z87440 Personal history of urinary (tract) infections: Secondary | ICD-10-CM | POA: Diagnosis not present

## 2022-03-04 DIAGNOSIS — M4802 Spinal stenosis, cervical region: Secondary | ICD-10-CM | POA: Diagnosis not present

## 2022-03-04 DIAGNOSIS — Z9181 History of falling: Secondary | ICD-10-CM | POA: Diagnosis not present

## 2022-03-04 DIAGNOSIS — K259 Gastric ulcer, unspecified as acute or chronic, without hemorrhage or perforation: Secondary | ICD-10-CM | POA: Diagnosis not present

## 2022-03-04 DIAGNOSIS — E538 Deficiency of other specified B group vitamins: Secondary | ICD-10-CM | POA: Diagnosis not present

## 2022-03-04 DIAGNOSIS — G43909 Migraine, unspecified, not intractable, without status migrainosus: Secondary | ICD-10-CM | POA: Diagnosis not present

## 2022-03-04 DIAGNOSIS — G9529 Other cord compression: Secondary | ICD-10-CM | POA: Diagnosis not present

## 2022-03-04 NOTE — Telephone Encounter (Signed)
Patient is requesting a refill of the following medications: Requested Prescriptions   Pending Prescriptions Disp Refills   phentermine 37.5 MG capsule 30 capsule 2    Sig: Take 1 capsule (37.5 mg total) by mouth every morning.    Date of patient request: 03/04/22 Last office visit: 03/02/22 Date of last refill: 10/04/21 Last refill amount: 30

## 2022-03-04 NOTE — Telephone Encounter (Signed)
Looks as if me and Noah Delaine have sent this to you

## 2022-03-04 NOTE — Telephone Encounter (Signed)
Phentemine 37.5 mg LOV: 03/02/22 Last Refill:03/02/22 Upcoming appt: none

## 2022-03-07 ENCOUNTER — Other Ambulatory Visit: Payer: Self-pay | Admitting: Family Medicine

## 2022-03-07 ENCOUNTER — Encounter: Payer: Self-pay | Admitting: Family Medicine

## 2022-03-07 ENCOUNTER — Telehealth: Payer: Self-pay

## 2022-03-07 DIAGNOSIS — I10 Essential (primary) hypertension: Secondary | ICD-10-CM | POA: Diagnosis not present

## 2022-03-07 DIAGNOSIS — K259 Gastric ulcer, unspecified as acute or chronic, without hemorrhage or perforation: Secondary | ICD-10-CM | POA: Diagnosis not present

## 2022-03-07 DIAGNOSIS — E538 Deficiency of other specified B group vitamins: Secondary | ICD-10-CM | POA: Diagnosis not present

## 2022-03-07 DIAGNOSIS — S81811D Laceration without foreign body, right lower leg, subsequent encounter: Secondary | ICD-10-CM | POA: Diagnosis not present

## 2022-03-07 DIAGNOSIS — Z993 Dependence on wheelchair: Secondary | ICD-10-CM | POA: Diagnosis not present

## 2022-03-07 DIAGNOSIS — E785 Hyperlipidemia, unspecified: Secondary | ICD-10-CM | POA: Diagnosis not present

## 2022-03-07 DIAGNOSIS — E559 Vitamin D deficiency, unspecified: Secondary | ICD-10-CM | POA: Diagnosis not present

## 2022-03-07 DIAGNOSIS — G4733 Obstructive sleep apnea (adult) (pediatric): Secondary | ICD-10-CM | POA: Diagnosis not present

## 2022-03-07 DIAGNOSIS — G43909 Migraine, unspecified, not intractable, without status migrainosus: Secondary | ICD-10-CM | POA: Diagnosis not present

## 2022-03-07 DIAGNOSIS — M5136 Other intervertebral disc degeneration, lumbar region: Secondary | ICD-10-CM | POA: Diagnosis not present

## 2022-03-07 DIAGNOSIS — Z9181 History of falling: Secondary | ICD-10-CM | POA: Diagnosis not present

## 2022-03-07 DIAGNOSIS — I872 Venous insufficiency (chronic) (peripheral): Secondary | ICD-10-CM | POA: Diagnosis not present

## 2022-03-07 DIAGNOSIS — G629 Polyneuropathy, unspecified: Secondary | ICD-10-CM | POA: Diagnosis not present

## 2022-03-07 DIAGNOSIS — F32A Depression, unspecified: Secondary | ICD-10-CM | POA: Diagnosis not present

## 2022-03-07 DIAGNOSIS — G9529 Other cord compression: Secondary | ICD-10-CM | POA: Diagnosis not present

## 2022-03-07 DIAGNOSIS — Z87440 Personal history of urinary (tract) infections: Secondary | ICD-10-CM | POA: Diagnosis not present

## 2022-03-07 DIAGNOSIS — K219 Gastro-esophageal reflux disease without esophagitis: Secondary | ICD-10-CM | POA: Diagnosis not present

## 2022-03-07 DIAGNOSIS — K760 Fatty (change of) liver, not elsewhere classified: Secondary | ICD-10-CM | POA: Diagnosis not present

## 2022-03-07 DIAGNOSIS — G9589 Other specified diseases of spinal cord: Secondary | ICD-10-CM | POA: Diagnosis not present

## 2022-03-07 DIAGNOSIS — M797 Fibromyalgia: Secondary | ICD-10-CM | POA: Diagnosis not present

## 2022-03-07 DIAGNOSIS — M300 Polyarteritis nodosa: Secondary | ICD-10-CM | POA: Diagnosis not present

## 2022-03-07 DIAGNOSIS — M4802 Spinal stenosis, cervical region: Secondary | ICD-10-CM | POA: Diagnosis not present

## 2022-03-07 MED ORDER — HYDROCODONE-ACETAMINOPHEN 10-325 MG PO TABS: 1.0000 | ORAL_TABLET | Freq: Three times a day (TID) | ORAL | 0 refills | Status: DC | PRN

## 2022-03-07 NOTE — Telephone Encounter (Signed)
Encourage patient to contact the pharmacy for refills or they can request refills through [facility]  (Please schedule appointment if patient has not been seen in over a year)    Kenneth THIS SENT TO:  [facility] - [place], [place] - [address] MEDICATION NAME & DOSE: Hydroxyzine-acetaminophen 10-325 mg  NOTES/COMMENTS FROM PATIENT: pt needs a refills      Front office please notify patient: It takes 48-72 hours to process rx refill requests Ask patient to call pharmacy to ensure rx is ready before heading there.

## 2022-03-07 NOTE — Telephone Encounter (Signed)
Called and informed patient. 

## 2022-03-07 NOTE — Telephone Encounter (Signed)
Patient is requesting a refill of the following medications: Requested Prescriptions   Pending Prescriptions Disp Refills   phentermine 37.5 MG capsule 30 capsule 2    Sig: Take 1 capsule (37.5 mg total) by mouth every morning.    Date of patient request: 03/07/22 Last office visit: 03/02/22 Date of last refill: 10/06/21 Last refill amount: 30

## 2022-03-07 NOTE — Telephone Encounter (Signed)
[facility] review placed in basket at the front

## 2022-03-07 NOTE — Telephone Encounter (Signed)
Hydrocodone 10-325 mg LOV: 03/02/22 Last Refill:02/09/22 Upcoming appt: none

## 2022-03-07 NOTE — Telephone Encounter (Signed)
Placed in Dr [name] to be signed folder once signed I will fax back  

## 2022-03-08 DIAGNOSIS — M797 Fibromyalgia: Secondary | ICD-10-CM | POA: Diagnosis not present

## 2022-03-08 DIAGNOSIS — E785 Hyperlipidemia, unspecified: Secondary | ICD-10-CM | POA: Diagnosis not present

## 2022-03-08 DIAGNOSIS — Z9181 History of falling: Secondary | ICD-10-CM | POA: Diagnosis not present

## 2022-03-08 DIAGNOSIS — G4733 Obstructive sleep apnea (adult) (pediatric): Secondary | ICD-10-CM | POA: Diagnosis not present

## 2022-03-08 DIAGNOSIS — I872 Venous insufficiency (chronic) (peripheral): Secondary | ICD-10-CM | POA: Diagnosis not present

## 2022-03-08 DIAGNOSIS — I10 Essential (primary) hypertension: Secondary | ICD-10-CM | POA: Diagnosis not present

## 2022-03-08 DIAGNOSIS — F32A Depression, unspecified: Secondary | ICD-10-CM | POA: Diagnosis not present

## 2022-03-08 DIAGNOSIS — K760 Fatty (change of) liver, not elsewhere classified: Secondary | ICD-10-CM | POA: Diagnosis not present

## 2022-03-08 DIAGNOSIS — G9589 Other specified diseases of spinal cord: Secondary | ICD-10-CM | POA: Diagnosis not present

## 2022-03-08 DIAGNOSIS — E538 Deficiency of other specified B group vitamins: Secondary | ICD-10-CM | POA: Diagnosis not present

## 2022-03-08 DIAGNOSIS — Z993 Dependence on wheelchair: Secondary | ICD-10-CM | POA: Diagnosis not present

## 2022-03-08 DIAGNOSIS — E559 Vitamin D deficiency, unspecified: Secondary | ICD-10-CM | POA: Diagnosis not present

## 2022-03-08 DIAGNOSIS — G43909 Migraine, unspecified, not intractable, without status migrainosus: Secondary | ICD-10-CM | POA: Diagnosis not present

## 2022-03-08 DIAGNOSIS — S81811D Laceration without foreign body, right lower leg, subsequent encounter: Secondary | ICD-10-CM | POA: Diagnosis not present

## 2022-03-08 DIAGNOSIS — M300 Polyarteritis nodosa: Secondary | ICD-10-CM | POA: Diagnosis not present

## 2022-03-08 DIAGNOSIS — M4802 Spinal stenosis, cervical region: Secondary | ICD-10-CM | POA: Diagnosis not present

## 2022-03-08 DIAGNOSIS — K219 Gastro-esophageal reflux disease without esophagitis: Secondary | ICD-10-CM | POA: Diagnosis not present

## 2022-03-08 DIAGNOSIS — M5136 Other intervertebral disc degeneration, lumbar region: Secondary | ICD-10-CM | POA: Diagnosis not present

## 2022-03-08 DIAGNOSIS — K259 Gastric ulcer, unspecified as acute or chronic, without hemorrhage or perforation: Secondary | ICD-10-CM | POA: Diagnosis not present

## 2022-03-08 DIAGNOSIS — Z87440 Personal history of urinary (tract) infections: Secondary | ICD-10-CM | POA: Diagnosis not present

## 2022-03-08 DIAGNOSIS — G9529 Other cord compression: Secondary | ICD-10-CM | POA: Diagnosis not present

## 2022-03-08 DIAGNOSIS — G629 Polyneuropathy, unspecified: Secondary | ICD-10-CM | POA: Diagnosis not present

## 2022-03-08 NOTE — Telephone Encounter (Signed)
Form completed and returned to [place] 

## 2022-03-08 NOTE — Telephone Encounter (Signed)
Forms have been faxed back and placed in scan

## 2022-03-09 ENCOUNTER — Telehealth: Payer: Self-pay

## 2022-03-09 DIAGNOSIS — K259 Gastric ulcer, unspecified as acute or chronic, without hemorrhage or perforation: Secondary | ICD-10-CM | POA: Diagnosis not present

## 2022-03-09 DIAGNOSIS — I872 Venous insufficiency (chronic) (peripheral): Secondary | ICD-10-CM | POA: Diagnosis not present

## 2022-03-09 DIAGNOSIS — I10 Essential (primary) hypertension: Secondary | ICD-10-CM | POA: Diagnosis not present

## 2022-03-09 DIAGNOSIS — M5136 Other intervertebral disc degeneration, lumbar region: Secondary | ICD-10-CM | POA: Diagnosis not present

## 2022-03-09 DIAGNOSIS — E871 Hypo-osmolality and hyponatremia: Secondary | ICD-10-CM | POA: Diagnosis not present

## 2022-03-09 DIAGNOSIS — E785 Hyperlipidemia, unspecified: Secondary | ICD-10-CM | POA: Diagnosis not present

## 2022-03-09 DIAGNOSIS — M4802 Spinal stenosis, cervical region: Secondary | ICD-10-CM | POA: Diagnosis not present

## 2022-03-09 DIAGNOSIS — G4733 Obstructive sleep apnea (adult) (pediatric): Secondary | ICD-10-CM | POA: Diagnosis not present

## 2022-03-09 DIAGNOSIS — Z87440 Personal history of urinary (tract) infections: Secondary | ICD-10-CM | POA: Diagnosis not present

## 2022-03-09 DIAGNOSIS — A419 Sepsis, unspecified organism: Secondary | ICD-10-CM | POA: Diagnosis not present

## 2022-03-09 DIAGNOSIS — Z9181 History of falling: Secondary | ICD-10-CM | POA: Diagnosis not present

## 2022-03-09 DIAGNOSIS — F32A Depression, unspecified: Secondary | ICD-10-CM | POA: Diagnosis not present

## 2022-03-09 DIAGNOSIS — S81811D Laceration without foreign body, right lower leg, subsequent encounter: Secondary | ICD-10-CM | POA: Diagnosis not present

## 2022-03-09 DIAGNOSIS — G43909 Migraine, unspecified, not intractable, without status migrainosus: Secondary | ICD-10-CM | POA: Diagnosis not present

## 2022-03-09 DIAGNOSIS — E538 Deficiency of other specified B group vitamins: Secondary | ICD-10-CM | POA: Diagnosis not present

## 2022-03-09 DIAGNOSIS — G9589 Other specified diseases of spinal cord: Secondary | ICD-10-CM | POA: Diagnosis not present

## 2022-03-09 DIAGNOSIS — M797 Fibromyalgia: Secondary | ICD-10-CM | POA: Diagnosis not present

## 2022-03-09 DIAGNOSIS — G9529 Other cord compression: Secondary | ICD-10-CM | POA: Diagnosis not present

## 2022-03-09 DIAGNOSIS — E559 Vitamin D deficiency, unspecified: Secondary | ICD-10-CM | POA: Diagnosis not present

## 2022-03-09 DIAGNOSIS — K219 Gastro-esophageal reflux disease without esophagitis: Secondary | ICD-10-CM | POA: Diagnosis not present

## 2022-03-09 DIAGNOSIS — G629 Polyneuropathy, unspecified: Secondary | ICD-10-CM | POA: Diagnosis not present

## 2022-03-09 DIAGNOSIS — M300 Polyarteritis nodosa: Secondary | ICD-10-CM | POA: Diagnosis not present

## 2022-03-09 DIAGNOSIS — K760 Fatty (change of) liver, not elsewhere classified: Secondary | ICD-10-CM | POA: Diagnosis not present

## 2022-03-09 DIAGNOSIS — Z993 Dependence on wheelchair: Secondary | ICD-10-CM | POA: Diagnosis not present

## 2022-03-09 NOTE — Telephone Encounter (Signed)
[facility] requesting PT extension to week 5, provided verbal orders as requested

## 2022-03-10 MED ORDER — HYDROCODONE-ACETAMINOPHEN 10-325 MG PO TABS: 1.0000 | ORAL_TABLET | Freq: Three times a day (TID) | ORAL | 0 refills | Status: DC | PRN

## 2022-03-11 DIAGNOSIS — E559 Vitamin D deficiency, unspecified: Secondary | ICD-10-CM | POA: Diagnosis not present

## 2022-03-11 DIAGNOSIS — M300 Polyarteritis nodosa: Secondary | ICD-10-CM | POA: Diagnosis not present

## 2022-03-11 DIAGNOSIS — I872 Venous insufficiency (chronic) (peripheral): Secondary | ICD-10-CM | POA: Diagnosis not present

## 2022-03-11 DIAGNOSIS — M797 Fibromyalgia: Secondary | ICD-10-CM | POA: Diagnosis not present

## 2022-03-11 DIAGNOSIS — E785 Hyperlipidemia, unspecified: Secondary | ICD-10-CM | POA: Diagnosis not present

## 2022-03-11 DIAGNOSIS — K259 Gastric ulcer, unspecified as acute or chronic, without hemorrhage or perforation: Secondary | ICD-10-CM | POA: Diagnosis not present

## 2022-03-11 DIAGNOSIS — Z87440 Personal history of urinary (tract) infections: Secondary | ICD-10-CM | POA: Diagnosis not present

## 2022-03-11 DIAGNOSIS — M5136 Other intervertebral disc degeneration, lumbar region: Secondary | ICD-10-CM | POA: Diagnosis not present

## 2022-03-11 DIAGNOSIS — G43909 Migraine, unspecified, not intractable, without status migrainosus: Secondary | ICD-10-CM | POA: Diagnosis not present

## 2022-03-11 DIAGNOSIS — K760 Fatty (change of) liver, not elsewhere classified: Secondary | ICD-10-CM | POA: Diagnosis not present

## 2022-03-11 DIAGNOSIS — G9529 Other cord compression: Secondary | ICD-10-CM | POA: Diagnosis not present

## 2022-03-11 DIAGNOSIS — E538 Deficiency of other specified B group vitamins: Secondary | ICD-10-CM | POA: Diagnosis not present

## 2022-03-11 DIAGNOSIS — F32A Depression, unspecified: Secondary | ICD-10-CM | POA: Diagnosis not present

## 2022-03-11 DIAGNOSIS — G4733 Obstructive sleep apnea (adult) (pediatric): Secondary | ICD-10-CM | POA: Diagnosis not present

## 2022-03-11 DIAGNOSIS — S81811D Laceration without foreign body, right lower leg, subsequent encounter: Secondary | ICD-10-CM | POA: Diagnosis not present

## 2022-03-11 DIAGNOSIS — K219 Gastro-esophageal reflux disease without esophagitis: Secondary | ICD-10-CM | POA: Diagnosis not present

## 2022-03-11 DIAGNOSIS — Z9181 History of falling: Secondary | ICD-10-CM | POA: Diagnosis not present

## 2022-03-11 DIAGNOSIS — Z993 Dependence on wheelchair: Secondary | ICD-10-CM | POA: Diagnosis not present

## 2022-03-11 DIAGNOSIS — G629 Polyneuropathy, unspecified: Secondary | ICD-10-CM | POA: Diagnosis not present

## 2022-03-11 DIAGNOSIS — M4802 Spinal stenosis, cervical region: Secondary | ICD-10-CM | POA: Diagnosis not present

## 2022-03-11 DIAGNOSIS — I10 Essential (primary) hypertension: Secondary | ICD-10-CM | POA: Diagnosis not present

## 2022-03-11 DIAGNOSIS — G9589 Other specified diseases of spinal cord: Secondary | ICD-10-CM | POA: Diagnosis not present

## 2022-03-14 DIAGNOSIS — M4722 Other spondylosis with radiculopathy, cervical region: Secondary | ICD-10-CM | POA: Diagnosis not present

## 2022-03-14 DIAGNOSIS — M4712 Other spondylosis with myelopathy, cervical region: Secondary | ICD-10-CM | POA: Diagnosis not present

## 2022-03-15 ENCOUNTER — Telehealth: Payer: Self-pay

## 2022-03-15 DIAGNOSIS — G43909 Migraine, unspecified, not intractable, without status migrainosus: Secondary | ICD-10-CM | POA: Diagnosis not present

## 2022-03-15 DIAGNOSIS — M300 Polyarteritis nodosa: Secondary | ICD-10-CM | POA: Diagnosis not present

## 2022-03-15 DIAGNOSIS — G629 Polyneuropathy, unspecified: Secondary | ICD-10-CM | POA: Diagnosis not present

## 2022-03-15 DIAGNOSIS — I10 Essential (primary) hypertension: Secondary | ICD-10-CM | POA: Diagnosis not present

## 2022-03-15 DIAGNOSIS — G4733 Obstructive sleep apnea (adult) (pediatric): Secondary | ICD-10-CM | POA: Diagnosis not present

## 2022-03-15 DIAGNOSIS — E785 Hyperlipidemia, unspecified: Secondary | ICD-10-CM | POA: Diagnosis not present

## 2022-03-15 DIAGNOSIS — Z87440 Personal history of urinary (tract) infections: Secondary | ICD-10-CM | POA: Diagnosis not present

## 2022-03-15 DIAGNOSIS — K259 Gastric ulcer, unspecified as acute or chronic, without hemorrhage or perforation: Secondary | ICD-10-CM | POA: Diagnosis not present

## 2022-03-15 DIAGNOSIS — Z9181 History of falling: Secondary | ICD-10-CM | POA: Diagnosis not present

## 2022-03-15 DIAGNOSIS — M797 Fibromyalgia: Secondary | ICD-10-CM | POA: Diagnosis not present

## 2022-03-15 DIAGNOSIS — M5136 Other intervertebral disc degeneration, lumbar region: Secondary | ICD-10-CM | POA: Diagnosis not present

## 2022-03-15 DIAGNOSIS — I872 Venous insufficiency (chronic) (peripheral): Secondary | ICD-10-CM | POA: Diagnosis not present

## 2022-03-15 DIAGNOSIS — G9529 Other cord compression: Secondary | ICD-10-CM | POA: Diagnosis not present

## 2022-03-15 DIAGNOSIS — Z993 Dependence on wheelchair: Secondary | ICD-10-CM | POA: Diagnosis not present

## 2022-03-15 DIAGNOSIS — E538 Deficiency of other specified B group vitamins: Secondary | ICD-10-CM | POA: Diagnosis not present

## 2022-03-15 DIAGNOSIS — M4802 Spinal stenosis, cervical region: Secondary | ICD-10-CM | POA: Diagnosis not present

## 2022-03-15 DIAGNOSIS — G9589 Other specified diseases of spinal cord: Secondary | ICD-10-CM | POA: Diagnosis not present

## 2022-03-15 DIAGNOSIS — E559 Vitamin D deficiency, unspecified: Secondary | ICD-10-CM | POA: Diagnosis not present

## 2022-03-15 DIAGNOSIS — K219 Gastro-esophageal reflux disease without esophagitis: Secondary | ICD-10-CM | POA: Diagnosis not present

## 2022-03-15 DIAGNOSIS — F32A Depression, unspecified: Secondary | ICD-10-CM | POA: Diagnosis not present

## 2022-03-15 DIAGNOSIS — K760 Fatty (change of) liver, not elsewhere classified: Secondary | ICD-10-CM | POA: Diagnosis not present

## 2022-03-15 DIAGNOSIS — S81811D Laceration without foreign body, right lower leg, subsequent encounter: Secondary | ICD-10-CM | POA: Diagnosis not present

## 2022-03-15 NOTE — Telephone Encounter (Signed)
Forms from [facility]  have been faxed  to [place] . Forms placed in Dr Birdie Riddle to be signed folder and once signed I will fax them back

## 2022-03-17 DIAGNOSIS — M4802 Spinal stenosis, cervical region: Secondary | ICD-10-CM | POA: Diagnosis not present

## 2022-03-17 DIAGNOSIS — Z993 Dependence on wheelchair: Secondary | ICD-10-CM | POA: Diagnosis not present

## 2022-03-17 DIAGNOSIS — Z9181 History of falling: Secondary | ICD-10-CM | POA: Diagnosis not present

## 2022-03-17 DIAGNOSIS — G43909 Migraine, unspecified, not intractable, without status migrainosus: Secondary | ICD-10-CM | POA: Diagnosis not present

## 2022-03-17 DIAGNOSIS — Z87440 Personal history of urinary (tract) infections: Secondary | ICD-10-CM | POA: Diagnosis not present

## 2022-03-17 DIAGNOSIS — G629 Polyneuropathy, unspecified: Secondary | ICD-10-CM | POA: Diagnosis not present

## 2022-03-17 DIAGNOSIS — M300 Polyarteritis nodosa: Secondary | ICD-10-CM | POA: Diagnosis not present

## 2022-03-17 DIAGNOSIS — G4733 Obstructive sleep apnea (adult) (pediatric): Secondary | ICD-10-CM | POA: Diagnosis not present

## 2022-03-17 DIAGNOSIS — E538 Deficiency of other specified B group vitamins: Secondary | ICD-10-CM | POA: Diagnosis not present

## 2022-03-17 DIAGNOSIS — K760 Fatty (change of) liver, not elsewhere classified: Secondary | ICD-10-CM | POA: Diagnosis not present

## 2022-03-17 DIAGNOSIS — M797 Fibromyalgia: Secondary | ICD-10-CM | POA: Diagnosis not present

## 2022-03-17 DIAGNOSIS — G9589 Other specified diseases of spinal cord: Secondary | ICD-10-CM | POA: Diagnosis not present

## 2022-03-17 DIAGNOSIS — G9529 Other cord compression: Secondary | ICD-10-CM | POA: Diagnosis not present

## 2022-03-17 DIAGNOSIS — I10 Essential (primary) hypertension: Secondary | ICD-10-CM | POA: Diagnosis not present

## 2022-03-17 DIAGNOSIS — S81811D Laceration without foreign body, right lower leg, subsequent encounter: Secondary | ICD-10-CM | POA: Diagnosis not present

## 2022-03-17 DIAGNOSIS — K259 Gastric ulcer, unspecified as acute or chronic, without hemorrhage or perforation: Secondary | ICD-10-CM | POA: Diagnosis not present

## 2022-03-17 DIAGNOSIS — K219 Gastro-esophageal reflux disease without esophagitis: Secondary | ICD-10-CM | POA: Diagnosis not present

## 2022-03-17 DIAGNOSIS — M5136 Other intervertebral disc degeneration, lumbar region: Secondary | ICD-10-CM | POA: Diagnosis not present

## 2022-03-17 DIAGNOSIS — F32A Depression, unspecified: Secondary | ICD-10-CM | POA: Diagnosis not present

## 2022-03-17 DIAGNOSIS — E785 Hyperlipidemia, unspecified: Secondary | ICD-10-CM | POA: Diagnosis not present

## 2022-03-17 DIAGNOSIS — E559 Vitamin D deficiency, unspecified: Secondary | ICD-10-CM | POA: Diagnosis not present

## 2022-03-17 DIAGNOSIS — I872 Venous insufficiency (chronic) (peripheral): Secondary | ICD-10-CM | POA: Diagnosis not present

## 2022-03-18 ENCOUNTER — Encounter: Payer: Self-pay | Admitting: Family Medicine

## 2022-03-21 NOTE — Telephone Encounter (Signed)
Forms have been faxed and placed in scan  

## 2022-03-21 NOTE — Telephone Encounter (Signed)
Form signed and returned to [place] 

## 2022-03-22 DIAGNOSIS — G9529 Other cord compression: Secondary | ICD-10-CM | POA: Diagnosis not present

## 2022-03-22 DIAGNOSIS — E785 Hyperlipidemia, unspecified: Secondary | ICD-10-CM | POA: Diagnosis not present

## 2022-03-22 DIAGNOSIS — G43909 Migraine, unspecified, not intractable, without status migrainosus: Secondary | ICD-10-CM | POA: Diagnosis not present

## 2022-03-22 DIAGNOSIS — M4802 Spinal stenosis, cervical region: Secondary | ICD-10-CM | POA: Diagnosis not present

## 2022-03-22 DIAGNOSIS — F32A Depression, unspecified: Secondary | ICD-10-CM | POA: Diagnosis not present

## 2022-03-22 DIAGNOSIS — K760 Fatty (change of) liver, not elsewhere classified: Secondary | ICD-10-CM | POA: Diagnosis not present

## 2022-03-22 DIAGNOSIS — G9589 Other specified diseases of spinal cord: Secondary | ICD-10-CM | POA: Diagnosis not present

## 2022-03-22 DIAGNOSIS — M5136 Other intervertebral disc degeneration, lumbar region: Secondary | ICD-10-CM | POA: Diagnosis not present

## 2022-03-22 DIAGNOSIS — Z993 Dependence on wheelchair: Secondary | ICD-10-CM | POA: Diagnosis not present

## 2022-03-22 DIAGNOSIS — K219 Gastro-esophageal reflux disease without esophagitis: Secondary | ICD-10-CM | POA: Diagnosis not present

## 2022-03-22 DIAGNOSIS — M300 Polyarteritis nodosa: Secondary | ICD-10-CM | POA: Diagnosis not present

## 2022-03-22 DIAGNOSIS — Z87440 Personal history of urinary (tract) infections: Secondary | ICD-10-CM | POA: Diagnosis not present

## 2022-03-22 DIAGNOSIS — G629 Polyneuropathy, unspecified: Secondary | ICD-10-CM | POA: Diagnosis not present

## 2022-03-22 DIAGNOSIS — S81811D Laceration without foreign body, right lower leg, subsequent encounter: Secondary | ICD-10-CM | POA: Diagnosis not present

## 2022-03-22 DIAGNOSIS — I872 Venous insufficiency (chronic) (peripheral): Secondary | ICD-10-CM | POA: Diagnosis not present

## 2022-03-22 DIAGNOSIS — E538 Deficiency of other specified B group vitamins: Secondary | ICD-10-CM | POA: Diagnosis not present

## 2022-03-22 DIAGNOSIS — I10 Essential (primary) hypertension: Secondary | ICD-10-CM | POA: Diagnosis not present

## 2022-03-22 DIAGNOSIS — Z9181 History of falling: Secondary | ICD-10-CM | POA: Diagnosis not present

## 2022-03-22 DIAGNOSIS — E559 Vitamin D deficiency, unspecified: Secondary | ICD-10-CM | POA: Diagnosis not present

## 2022-03-22 DIAGNOSIS — K259 Gastric ulcer, unspecified as acute or chronic, without hemorrhage or perforation: Secondary | ICD-10-CM | POA: Diagnosis not present

## 2022-03-22 DIAGNOSIS — G4733 Obstructive sleep apnea (adult) (pediatric): Secondary | ICD-10-CM | POA: Diagnosis not present

## 2022-03-22 DIAGNOSIS — M797 Fibromyalgia: Secondary | ICD-10-CM | POA: Diagnosis not present

## 2022-03-23 DIAGNOSIS — G4733 Obstructive sleep apnea (adult) (pediatric): Secondary | ICD-10-CM | POA: Diagnosis not present

## 2022-03-23 DIAGNOSIS — M797 Fibromyalgia: Secondary | ICD-10-CM | POA: Diagnosis not present

## 2022-03-23 DIAGNOSIS — Z87440 Personal history of urinary (tract) infections: Secondary | ICD-10-CM | POA: Diagnosis not present

## 2022-03-23 DIAGNOSIS — I10 Essential (primary) hypertension: Secondary | ICD-10-CM | POA: Diagnosis not present

## 2022-03-23 DIAGNOSIS — Z9181 History of falling: Secondary | ICD-10-CM | POA: Diagnosis not present

## 2022-03-23 DIAGNOSIS — G9529 Other cord compression: Secondary | ICD-10-CM | POA: Diagnosis not present

## 2022-03-23 DIAGNOSIS — Z993 Dependence on wheelchair: Secondary | ICD-10-CM | POA: Diagnosis not present

## 2022-03-23 DIAGNOSIS — G629 Polyneuropathy, unspecified: Secondary | ICD-10-CM | POA: Diagnosis not present

## 2022-03-23 DIAGNOSIS — K760 Fatty (change of) liver, not elsewhere classified: Secondary | ICD-10-CM | POA: Diagnosis not present

## 2022-03-23 DIAGNOSIS — E785 Hyperlipidemia, unspecified: Secondary | ICD-10-CM | POA: Diagnosis not present

## 2022-03-23 DIAGNOSIS — K259 Gastric ulcer, unspecified as acute or chronic, without hemorrhage or perforation: Secondary | ICD-10-CM | POA: Diagnosis not present

## 2022-03-23 DIAGNOSIS — I872 Venous insufficiency (chronic) (peripheral): Secondary | ICD-10-CM | POA: Diagnosis not present

## 2022-03-23 DIAGNOSIS — E559 Vitamin D deficiency, unspecified: Secondary | ICD-10-CM | POA: Diagnosis not present

## 2022-03-23 DIAGNOSIS — F32A Depression, unspecified: Secondary | ICD-10-CM | POA: Diagnosis not present

## 2022-03-23 DIAGNOSIS — M300 Polyarteritis nodosa: Secondary | ICD-10-CM | POA: Diagnosis not present

## 2022-03-23 DIAGNOSIS — M5136 Other intervertebral disc degeneration, lumbar region: Secondary | ICD-10-CM | POA: Diagnosis not present

## 2022-03-23 DIAGNOSIS — S81811D Laceration without foreign body, right lower leg, subsequent encounter: Secondary | ICD-10-CM | POA: Diagnosis not present

## 2022-03-23 DIAGNOSIS — G43909 Migraine, unspecified, not intractable, without status migrainosus: Secondary | ICD-10-CM | POA: Diagnosis not present

## 2022-03-23 DIAGNOSIS — E538 Deficiency of other specified B group vitamins: Secondary | ICD-10-CM | POA: Diagnosis not present

## 2022-03-23 DIAGNOSIS — M4802 Spinal stenosis, cervical region: Secondary | ICD-10-CM | POA: Diagnosis not present

## 2022-03-23 DIAGNOSIS — K219 Gastro-esophageal reflux disease without esophagitis: Secondary | ICD-10-CM | POA: Diagnosis not present

## 2022-03-23 DIAGNOSIS — G9589 Other specified diseases of spinal cord: Secondary | ICD-10-CM | POA: Diagnosis not present

## 2022-03-25 DIAGNOSIS — G629 Polyneuropathy, unspecified: Secondary | ICD-10-CM | POA: Diagnosis not present

## 2022-03-25 DIAGNOSIS — M797 Fibromyalgia: Secondary | ICD-10-CM | POA: Diagnosis not present

## 2022-03-25 DIAGNOSIS — K760 Fatty (change of) liver, not elsewhere classified: Secondary | ICD-10-CM | POA: Diagnosis not present

## 2022-03-25 DIAGNOSIS — Z9181 History of falling: Secondary | ICD-10-CM | POA: Diagnosis not present

## 2022-03-25 DIAGNOSIS — M4802 Spinal stenosis, cervical region: Secondary | ICD-10-CM | POA: Diagnosis not present

## 2022-03-25 DIAGNOSIS — G9529 Other cord compression: Secondary | ICD-10-CM | POA: Diagnosis not present

## 2022-03-25 DIAGNOSIS — G4733 Obstructive sleep apnea (adult) (pediatric): Secondary | ICD-10-CM | POA: Diagnosis not present

## 2022-03-25 DIAGNOSIS — E538 Deficiency of other specified B group vitamins: Secondary | ICD-10-CM | POA: Diagnosis not present

## 2022-03-25 DIAGNOSIS — M300 Polyarteritis nodosa: Secondary | ICD-10-CM | POA: Diagnosis not present

## 2022-03-25 DIAGNOSIS — I872 Venous insufficiency (chronic) (peripheral): Secondary | ICD-10-CM | POA: Diagnosis not present

## 2022-03-25 DIAGNOSIS — F32A Depression, unspecified: Secondary | ICD-10-CM | POA: Diagnosis not present

## 2022-03-25 DIAGNOSIS — Z87440 Personal history of urinary (tract) infections: Secondary | ICD-10-CM | POA: Diagnosis not present

## 2022-03-25 DIAGNOSIS — E785 Hyperlipidemia, unspecified: Secondary | ICD-10-CM | POA: Diagnosis not present

## 2022-03-25 DIAGNOSIS — I10 Essential (primary) hypertension: Secondary | ICD-10-CM | POA: Diagnosis not present

## 2022-03-25 DIAGNOSIS — K259 Gastric ulcer, unspecified as acute or chronic, without hemorrhage or perforation: Secondary | ICD-10-CM | POA: Diagnosis not present

## 2022-03-25 DIAGNOSIS — Z993 Dependence on wheelchair: Secondary | ICD-10-CM | POA: Diagnosis not present

## 2022-03-25 DIAGNOSIS — G43909 Migraine, unspecified, not intractable, without status migrainosus: Secondary | ICD-10-CM | POA: Diagnosis not present

## 2022-03-25 DIAGNOSIS — K219 Gastro-esophageal reflux disease without esophagitis: Secondary | ICD-10-CM | POA: Diagnosis not present

## 2022-03-25 DIAGNOSIS — G9589 Other specified diseases of spinal cord: Secondary | ICD-10-CM | POA: Diagnosis not present

## 2022-03-25 DIAGNOSIS — M5136 Other intervertebral disc degeneration, lumbar region: Secondary | ICD-10-CM | POA: Diagnosis not present

## 2022-03-25 DIAGNOSIS — E559 Vitamin D deficiency, unspecified: Secondary | ICD-10-CM | POA: Diagnosis not present

## 2022-03-25 DIAGNOSIS — S81811D Laceration without foreign body, right lower leg, subsequent encounter: Secondary | ICD-10-CM | POA: Diagnosis not present

## 2022-03-28 DIAGNOSIS — M300 Polyarteritis nodosa: Secondary | ICD-10-CM | POA: Diagnosis not present

## 2022-03-28 DIAGNOSIS — F32A Depression, unspecified: Secondary | ICD-10-CM | POA: Diagnosis not present

## 2022-03-28 DIAGNOSIS — E538 Deficiency of other specified B group vitamins: Secondary | ICD-10-CM | POA: Diagnosis not present

## 2022-03-28 DIAGNOSIS — G43909 Migraine, unspecified, not intractable, without status migrainosus: Secondary | ICD-10-CM | POA: Diagnosis not present

## 2022-03-28 DIAGNOSIS — E559 Vitamin D deficiency, unspecified: Secondary | ICD-10-CM | POA: Diagnosis not present

## 2022-03-28 DIAGNOSIS — E785 Hyperlipidemia, unspecified: Secondary | ICD-10-CM | POA: Diagnosis not present

## 2022-03-28 DIAGNOSIS — Z87440 Personal history of urinary (tract) infections: Secondary | ICD-10-CM | POA: Diagnosis not present

## 2022-03-28 DIAGNOSIS — G629 Polyneuropathy, unspecified: Secondary | ICD-10-CM | POA: Diagnosis not present

## 2022-03-28 DIAGNOSIS — Z9181 History of falling: Secondary | ICD-10-CM | POA: Diagnosis not present

## 2022-03-28 DIAGNOSIS — I10 Essential (primary) hypertension: Secondary | ICD-10-CM | POA: Diagnosis not present

## 2022-03-28 DIAGNOSIS — S81811D Laceration without foreign body, right lower leg, subsequent encounter: Secondary | ICD-10-CM | POA: Diagnosis not present

## 2022-03-28 DIAGNOSIS — M5136 Other intervertebral disc degeneration, lumbar region: Secondary | ICD-10-CM | POA: Diagnosis not present

## 2022-03-28 DIAGNOSIS — G4733 Obstructive sleep apnea (adult) (pediatric): Secondary | ICD-10-CM | POA: Diagnosis not present

## 2022-03-28 DIAGNOSIS — M4802 Spinal stenosis, cervical region: Secondary | ICD-10-CM | POA: Diagnosis not present

## 2022-03-28 DIAGNOSIS — G9529 Other cord compression: Secondary | ICD-10-CM | POA: Diagnosis not present

## 2022-03-28 DIAGNOSIS — K760 Fatty (change of) liver, not elsewhere classified: Secondary | ICD-10-CM | POA: Diagnosis not present

## 2022-03-28 DIAGNOSIS — Z993 Dependence on wheelchair: Secondary | ICD-10-CM | POA: Diagnosis not present

## 2022-03-28 DIAGNOSIS — M797 Fibromyalgia: Secondary | ICD-10-CM | POA: Diagnosis not present

## 2022-03-28 DIAGNOSIS — K259 Gastric ulcer, unspecified as acute or chronic, without hemorrhage or perforation: Secondary | ICD-10-CM | POA: Diagnosis not present

## 2022-03-28 DIAGNOSIS — I872 Venous insufficiency (chronic) (peripheral): Secondary | ICD-10-CM | POA: Diagnosis not present

## 2022-03-28 DIAGNOSIS — G9589 Other specified diseases of spinal cord: Secondary | ICD-10-CM | POA: Diagnosis not present

## 2022-03-28 DIAGNOSIS — K219 Gastro-esophageal reflux disease without esophagitis: Secondary | ICD-10-CM | POA: Diagnosis not present

## 2022-03-29 DIAGNOSIS — M5136 Other intervertebral disc degeneration, lumbar region: Secondary | ICD-10-CM | POA: Diagnosis not present

## 2022-03-29 DIAGNOSIS — Z87440 Personal history of urinary (tract) infections: Secondary | ICD-10-CM | POA: Diagnosis not present

## 2022-03-29 DIAGNOSIS — E559 Vitamin D deficiency, unspecified: Secondary | ICD-10-CM | POA: Diagnosis not present

## 2022-03-29 DIAGNOSIS — G9529 Other cord compression: Secondary | ICD-10-CM | POA: Diagnosis not present

## 2022-03-29 DIAGNOSIS — I10 Essential (primary) hypertension: Secondary | ICD-10-CM | POA: Diagnosis not present

## 2022-03-29 DIAGNOSIS — K259 Gastric ulcer, unspecified as acute or chronic, without hemorrhage or perforation: Secondary | ICD-10-CM | POA: Diagnosis not present

## 2022-03-29 DIAGNOSIS — G4733 Obstructive sleep apnea (adult) (pediatric): Secondary | ICD-10-CM | POA: Diagnosis not present

## 2022-03-29 DIAGNOSIS — I872 Venous insufficiency (chronic) (peripheral): Secondary | ICD-10-CM | POA: Diagnosis not present

## 2022-03-29 DIAGNOSIS — E538 Deficiency of other specified B group vitamins: Secondary | ICD-10-CM | POA: Diagnosis not present

## 2022-03-29 DIAGNOSIS — S81811D Laceration without foreign body, right lower leg, subsequent encounter: Secondary | ICD-10-CM | POA: Diagnosis not present

## 2022-03-29 DIAGNOSIS — F32A Depression, unspecified: Secondary | ICD-10-CM | POA: Diagnosis not present

## 2022-03-29 DIAGNOSIS — M797 Fibromyalgia: Secondary | ICD-10-CM | POA: Diagnosis not present

## 2022-03-29 DIAGNOSIS — E785 Hyperlipidemia, unspecified: Secondary | ICD-10-CM | POA: Diagnosis not present

## 2022-03-29 DIAGNOSIS — G629 Polyneuropathy, unspecified: Secondary | ICD-10-CM | POA: Diagnosis not present

## 2022-03-29 DIAGNOSIS — K760 Fatty (change of) liver, not elsewhere classified: Secondary | ICD-10-CM | POA: Diagnosis not present

## 2022-03-29 DIAGNOSIS — M4802 Spinal stenosis, cervical region: Secondary | ICD-10-CM | POA: Diagnosis not present

## 2022-03-29 DIAGNOSIS — G9589 Other specified diseases of spinal cord: Secondary | ICD-10-CM | POA: Diagnosis not present

## 2022-03-29 DIAGNOSIS — Z993 Dependence on wheelchair: Secondary | ICD-10-CM | POA: Diagnosis not present

## 2022-03-29 DIAGNOSIS — K219 Gastro-esophageal reflux disease without esophagitis: Secondary | ICD-10-CM | POA: Diagnosis not present

## 2022-03-29 DIAGNOSIS — Z9181 History of falling: Secondary | ICD-10-CM | POA: Diagnosis not present

## 2022-03-29 DIAGNOSIS — G43909 Migraine, unspecified, not intractable, without status migrainosus: Secondary | ICD-10-CM | POA: Diagnosis not present

## 2022-03-29 DIAGNOSIS — M300 Polyarteritis nodosa: Secondary | ICD-10-CM | POA: Diagnosis not present

## 2022-03-29 NOTE — Unmapped (Signed)
[facility] Refill Coordination Note    Specialty Medication(s) to be Shipped:   Inflammatory Disorders: Skyrizi    Other medication(s) to be shipped: No additional medications requested for fill at this time     Autumn Mcdonald, DOB: 15-Apr-1971  Phone: [phone] (home)       All above HIPAA information was verified with patient.     Was a Nurse, learning disability used for this call? No    Completed refill call assessment today to schedule patient's medication shipment from the [facility] [phone]).  All relevant notes have been reviewed.     Specialty medication(s) and dose(s) confirmed: Regimen is correct and unchanged.   Changes to medications: Nadine reports no changes at this time.  Changes to insurance: No  New side effects reported not previously addressed with a pharmacist or physician: None reported  Questions for the pharmacist: No    Confirmed patient received a Conservation officer, historic buildings and a Surveyor, mining with first shipment. The patient will receive a drug information handout for each medication shipped and additional FDA Medication Guides as required.       DISEASE/MEDICATION-SPECIFIC INFORMATION        For patients on injectable medications: Patient currently has 0 doses left.  Next injection is scheduled for 11/29.    SPECIALTY MEDICATION ADHERENCE     Medication Adherence    Patient reported X missed doses in the last month: 0  Specialty Medication: skyrizi  Patient is on additional specialty medications: No  Any gaps in refill history greater than 2 weeks in the last 3 months: no  Demonstrates understanding of importance of adherence: yes  Informant: patient  Reliability of informant: reliable              Confirmed plan for next specialty medication refill: delivery by pharmacy  Refills needed for supportive medications: not needed              Were doses missed due to medication being on hold? No    Skyrizi 150 mg/ml: 0 days of medicine on hand       REFERRAL TO PHARMACIST     Referral to the pharmacist: Not needed      [facility]     Shipping address confirmed in Epic.     Delivery Scheduled: Yes, Expected medication delivery date: 11/16.     Medication will be delivered via UPS to the prescription address in Epic WAM.    Valere Dross   [facility] Specialty Technician

## 2022-03-30 ENCOUNTER — Other Ambulatory Visit: Payer: Self-pay | Admitting: Family Medicine

## 2022-03-30 DIAGNOSIS — E559 Vitamin D deficiency, unspecified: Secondary | ICD-10-CM | POA: Diagnosis not present

## 2022-03-30 DIAGNOSIS — M300 Polyarteritis nodosa: Secondary | ICD-10-CM | POA: Diagnosis not present

## 2022-03-30 DIAGNOSIS — I872 Venous insufficiency (chronic) (peripheral): Secondary | ICD-10-CM | POA: Diagnosis not present

## 2022-03-30 DIAGNOSIS — E785 Hyperlipidemia, unspecified: Secondary | ICD-10-CM | POA: Diagnosis not present

## 2022-03-30 DIAGNOSIS — G9529 Other cord compression: Secondary | ICD-10-CM | POA: Diagnosis not present

## 2022-03-30 DIAGNOSIS — K219 Gastro-esophageal reflux disease without esophagitis: Secondary | ICD-10-CM | POA: Diagnosis not present

## 2022-03-30 DIAGNOSIS — Z9181 History of falling: Secondary | ICD-10-CM | POA: Diagnosis not present

## 2022-03-30 DIAGNOSIS — G629 Polyneuropathy, unspecified: Secondary | ICD-10-CM | POA: Diagnosis not present

## 2022-03-30 DIAGNOSIS — G43909 Migraine, unspecified, not intractable, without status migrainosus: Secondary | ICD-10-CM | POA: Diagnosis not present

## 2022-03-30 DIAGNOSIS — K760 Fatty (change of) liver, not elsewhere classified: Secondary | ICD-10-CM | POA: Diagnosis not present

## 2022-03-30 DIAGNOSIS — G9589 Other specified diseases of spinal cord: Secondary | ICD-10-CM | POA: Diagnosis not present

## 2022-03-30 DIAGNOSIS — Z87440 Personal history of urinary (tract) infections: Secondary | ICD-10-CM | POA: Diagnosis not present

## 2022-03-30 DIAGNOSIS — I10 Essential (primary) hypertension: Secondary | ICD-10-CM | POA: Diagnosis not present

## 2022-03-30 DIAGNOSIS — M4802 Spinal stenosis, cervical region: Secondary | ICD-10-CM | POA: Diagnosis not present

## 2022-03-30 DIAGNOSIS — Z993 Dependence on wheelchair: Secondary | ICD-10-CM | POA: Diagnosis not present

## 2022-03-30 DIAGNOSIS — E538 Deficiency of other specified B group vitamins: Secondary | ICD-10-CM | POA: Diagnosis not present

## 2022-03-30 DIAGNOSIS — S81811D Laceration without foreign body, right lower leg, subsequent encounter: Secondary | ICD-10-CM | POA: Diagnosis not present

## 2022-03-30 DIAGNOSIS — G4733 Obstructive sleep apnea (adult) (pediatric): Secondary | ICD-10-CM | POA: Diagnosis not present

## 2022-03-30 DIAGNOSIS — M797 Fibromyalgia: Secondary | ICD-10-CM | POA: Diagnosis not present

## 2022-03-30 DIAGNOSIS — K259 Gastric ulcer, unspecified as acute or chronic, without hemorrhage or perforation: Secondary | ICD-10-CM | POA: Diagnosis not present

## 2022-03-30 DIAGNOSIS — F32A Depression, unspecified: Secondary | ICD-10-CM | POA: Diagnosis not present

## 2022-03-30 DIAGNOSIS — M5136 Other intervertebral disc degeneration, lumbar region: Secondary | ICD-10-CM | POA: Diagnosis not present

## 2022-03-30 MED FILL — SKYRIZI 150 MG/ML SUBCUTANEOUS PEN INJECTOR: SUBCUTANEOUS | 84 days supply | Qty: 1 | Fill #2

## 2022-03-30 NOTE — Telephone Encounter (Signed)
Patient is requesting a refill of the following medications: Requested Prescriptions   Pending Prescriptions Disp Refills   clonazePAM (KLONOPIN) 1 MG tablet [Pharmacy Med Name: clonazepam 1 mg tablet] 30 tablet 3    Sig: TAKE 1 TABLET BY MOUTH AT BEDTIME   cyanocobalamin (VITAMIN B12) 1000 MCG tablet [Pharmacy Med Name: cyanocobalamin (vit B-12) 1,000 mcg tablet] 30 tablet 2    Sig: TAKE 1 TABLET BY MOUTH EVERY DAY    Date of patient request: 03/30/22 Last office visit: 01/12/22 Date of last refill: 12/08/21 Last refill amount: 30

## 2022-03-31 ENCOUNTER — Encounter: Payer: Self-pay | Admitting: Family Medicine

## 2022-03-31 ENCOUNTER — Telehealth (INDEPENDENT_AMBULATORY_CARE_PROVIDER_SITE_OTHER): Payer: Medicare Other | Admitting: Family Medicine

## 2022-03-31 DIAGNOSIS — R051 Acute cough: Secondary | ICD-10-CM

## 2022-03-31 MED ORDER — AMOXICILLIN 875 MG PO TABS: 875.0000 mg | ORAL_TABLET | Freq: Two times a day (BID) | ORAL | 0 refills | Status: AC

## 2022-03-31 NOTE — Progress Notes (Signed)
Virtual Visit via Video   I connected with patient on 03/31/22 at  1:00 PM EST by a video enabled telemedicine application and verified that I am speaking with the correct person using two identifiers.  Location patient: Home Location provider: [facility], Office Persons participating in the virtual visit: Patient, Provider, [place] Marcille Blanco C)  I discussed the limitations of evaluation and management by telemedicine and the availability of in person appointments. The patient expressed understanding and agreed to proceed.  Subjective:   HPI:   Chest congestion- sxs started ~10 days ago.  Has had negative COVID test.  Denies sinus pain/pressure.  No fever.  Will have SOB w/ prolonged talking.  Cough is wet, but not productive.  Mild wheezing.  Is immunosuppressed due to medication Orson Ape)  ROS:   See pertinent positives and negatives per HPI.  Patient Active Problem List   Diagnosis Date Noted   Leg cramps 01/12/2022   Positive blood cultures 08/14/2021   PNA (pneumonia) 08/12/2021   Fever of unknown origin 08/12/2021   Sacral pressure sore 07/06/2021   Vitamin B6 deficiency 06/08/2021   Myelomalacia of cervical cord ([place]) 06/06/2021   Compressive cervical cord myelomalacia ([place]) 06/05/2021   B12 deficiency 06/05/2021   Folate deficiency 06/05/2021   Vitamin D deficiency 06/05/2021   Leg weakness, bilateral 06/04/2021   Cervical atypia 01/06/2021   Steatosis of liver 01/06/2021   Hyponatremia 07/27/2019   Urinary urgency 04/04/2018   Chronic narcotic use 09/07/2016   Acute blood loss anemia 01/14/2016   Multiple gastric ulcers    PAN (polyarteritis nodosa) ([place]) 11/24/2015   GERD (gastroesophageal reflux disease) 01/08/2014   Routine general medical examination at a health care facility 04/23/2013   Bariatric surgery status 10/31/2012   S/P skin biopsy 10/31/2012   Psoriasis 01/09/2012   DDD (degenerative disc disease), lumbar 11/30/2011   Migraine headache     [name] (obstructive sleep apnea) 11/16/2010   HYPERGLYCEMIA, FASTING 10/08/2009   CONTRACTURE OF TENDON 08/17/2009   PARESTHESIA, HANDS 12/23/2008   Leg pain, right 11/05/2008   ADVERSE DRUG REACTION 04/03/2008   PERIPHERAL EDEMA 03/26/2008   Obesity, Class III, BMI 40-49.9 (morbid obesity) ([place]) 01/03/2007   Cellulitis 01/03/2007   Hyperlipidemia 09/22/2006   Fibromyalgia 09/20/2006   Anxiety and depression 06/28/2006   Essential hypertension 06/28/2006    Social History   Tobacco Use   Smoking status: Never   Smokeless tobacco: Never  Substance Use Topics   Alcohol use: No    Current Outpatient Medications:    ALPRAZolam (XANAX) 0.5 MG tablet, TAKE 1 TABLET BY MOUTH 2 TIMES DAILY, Disp: 30 tablet, Rfl: 3   ARIPiprazole (ABILIFY) 15 MG tablet, TAKE 1 TABLET BY MOUTH EVERY DAY, Disp: 90 tablet, Rfl: 1   Armodafinil 250 MG tablet, Take 250 mg by mouth in the morning and at bedtime., Disp: , Rfl:    Biotin 10 MG TABS, Take 10 mg by mouth in the morning. , Disp: , Rfl:    buPROPion (WELLBUTRIN XL) 300 MG 24 hr tablet, TAKE 1 TABLET BY MOUTH EVERY DAY, Disp: 30 tablet, Rfl: 2   calcium citrate (CALCITRATE - DOSED IN MG ELEMENTAL CALCIUM) 950 MG tablet, Take 1 tablet by mouth daily., Disp: , Rfl:    clonazePAM (KLONOPIN) 1 MG tablet, TAKE 1 TABLET BY MOUTH AT BEDTIME, Disp: 30 tablet, Rfl: 3   cyanocobalamin (VITAMIN B12) 1000 MCG tablet, TAKE 1 TABLET BY MOUTH EVERY DAY, Disp: 30 tablet, Rfl: 2   cyclobenzaprine (FLEXERIL)  10 MG tablet, TAKE 2 TABLETS BY MOUTH 2 TIMES DAILY, Disp: 120 tablet, Rfl: 2   diclofenac Sodium (VOLTAREN) 1 % GEL, Apply 4 g topically 4 (four) times daily., Disp: , Rfl:    diphenhydrAMINE (BENADRYL) 25 MG tablet, Take 25 mg by mouth 2 (two) times daily as needed for allergies., Disp: , Rfl:    DULoxetine (CYMBALTA) 60 MG capsule, Take 2 capsules (120 mg total) by mouth daily. (Patient taking differently: Take 60 mg by mouth 2 (two) times daily.), Disp: 240  capsule, Rfl: 2   FEROSUL 325 (65 Fe) MG tablet, TAKE 1 TABLET BY MOUTH EVERY DAY WITH BREAKFAST, Disp: 30 tablet, Rfl: 2   folic acid (FOLVITE) 1 MG tablet, TAKE 1 TABLET BY MOUTH EVERY DAY, Disp: 30 tablet, Rfl: 2   gabapentin (NEURONTIN) 600 MG tablet, Take 1 tablet (600 mg total) by mouth 3 (three) times daily. TAKE 2 TABLETS BY MOUTH 3 TIMES DAILY (Patient taking differently: Take 600 mg by mouth 3 (three) times daily.), Disp: 270 tablet, Rfl: 0   GNP VITAMIN B-1 100 MG tablet, TAKE 1 TABLET BY MOUTH EVERY DAY, Disp: 30 tablet, Rfl: 2   HYDROcodone-acetaminophen (NORCO) 10-325 MG tablet, Take 1 tablet by mouth every 8 (eight) hours as needed for up to 90 doses., Disp: 90 tablet, Rfl: 0   hydrOXYzine (VISTARIL) 25 MG capsule, TAKE 1 CAPSULE BY MOUTH EVERY 8 HOURS AS NEEDED, Disp: 60 capsule, Rfl: 0   nebivolol (BYSTOLIC) 10 MG tablet, TAKE 1 TABLET BY MOUTH EVERY DAY, Disp: 30 tablet, Rfl: 2   nystatin (MYCOSTATIN/NYSTOP) powder, APPLY 1 APPLICATION TOPICALLY 3 TIMES DAILY, Disp: 60 g, Rfl: 3   Oxcarbazepine (TRILEPTAL) 300 MG tablet, Take 300 mg by mouth 2 (two) times daily., Disp: , Rfl:    pantoprazole (PROTONIX) 40 MG tablet, TAKE 1 TABLET BY MOUTH 2 TIMES DAILY, Disp: 60 tablet, Rfl: 2   promethazine (PHENERGAN) 25 MG tablet, TAKE 1 TABLET BY MOUTH EVERY 6 HOURS AS NEEDED FOR NAUSEA AND VOMITING, Disp: 45 tablet, Rfl: 3   pyridOXINE (VITAMIN B6) 100 MG tablet, TAKE 1 TABLET BY MOUTH EVERY DAY, Disp: 30 tablet, Rfl: 2   rosuvastatin (CRESTOR) 20 MG tablet, TAKE 1 TABLET BY MOUTH EVERY DAY, Disp: 90 tablet, Rfl: 2   SKYRIZI 150 MG/ML SOSY, Inject 150 mg into the skin every 3 (three) months., Disp: , Rfl:    doxycycline (VIBRA-TABS) 100 MG tablet, Take 1 tablet (100 mg total) by mouth 2 (two) times daily. (Patient not taking: Reported on 03/31/2022), Disp: 20 tablet, Rfl: 0   phentermine 37.5 MG capsule, Take 1 capsule (37.5 mg total) by mouth every morning., Disp: 30 capsule, Rfl:  2  Allergies  Allergen Reactions   Niacin Anaphylaxis, Shortness Of Breath and Swelling    Swelling and "problems breathing"   Sulfamethoxazole-Trimethoprim Anaphylaxis, Swelling, Rash and Other (See Comments)    Throat closed and the eyes became swollen   Aspirin Hives   Bactrim Hives   Benzoin Compound Rash   Cephalexin Hives    Tolerated ceftriaxone and cefepime 07/2019   Iohexol Hives    Pt treated with PO benedryl   Lisinopril Hives   Naproxen Hives   Pnu-Imune [Pneumococcal Polysaccharide Vaccine] Rash   Sulfonamide Derivatives Rash    Objective:   There were no vitals taken for this visit. AAOx3, NAD NCAT, EOMI No obvious CN deficits Coloring WNL Pt is able to speak clearly, coherently without shortness of breath or increased work of  breathing.  Thought process is linear.  Mood is appropriate.   Assessment and Plan:   Cough- new.  Sxs started ~10 days ago.  Pt initially thought sxs would improve but they haven't.  Cough is wet but not productive.  She has occasional SOB and wheezing.  She is immunosuppressed on the [facility] and has a hx of PNA.  Will start Amoxicillin '875mg'$  BID and she is to continue Mucinex and Robitussin/Delsym prn.  Pt expressed understanding and is in agreement w/ plan.    [name], MD 03/31/2022

## 2022-04-03 DIAGNOSIS — G43909 Migraine, unspecified, not intractable, without status migrainosus: Secondary | ICD-10-CM | POA: Diagnosis not present

## 2022-04-03 DIAGNOSIS — M797 Fibromyalgia: Secondary | ICD-10-CM | POA: Diagnosis not present

## 2022-04-03 DIAGNOSIS — K760 Fatty (change of) liver, not elsewhere classified: Secondary | ICD-10-CM | POA: Diagnosis not present

## 2022-04-03 DIAGNOSIS — I872 Venous insufficiency (chronic) (peripheral): Secondary | ICD-10-CM | POA: Diagnosis not present

## 2022-04-03 DIAGNOSIS — K219 Gastro-esophageal reflux disease without esophagitis: Secondary | ICD-10-CM | POA: Diagnosis not present

## 2022-04-03 DIAGNOSIS — F32A Depression, unspecified: Secondary | ICD-10-CM | POA: Diagnosis not present

## 2022-04-03 DIAGNOSIS — S81811D Laceration without foreign body, right lower leg, subsequent encounter: Secondary | ICD-10-CM | POA: Diagnosis not present

## 2022-04-03 DIAGNOSIS — G4733 Obstructive sleep apnea (adult) (pediatric): Secondary | ICD-10-CM | POA: Diagnosis not present

## 2022-04-03 DIAGNOSIS — E538 Deficiency of other specified B group vitamins: Secondary | ICD-10-CM | POA: Diagnosis not present

## 2022-04-03 DIAGNOSIS — M5136 Other intervertebral disc degeneration, lumbar region: Secondary | ICD-10-CM | POA: Diagnosis not present

## 2022-04-03 DIAGNOSIS — E559 Vitamin D deficiency, unspecified: Secondary | ICD-10-CM | POA: Diagnosis not present

## 2022-04-03 DIAGNOSIS — I10 Essential (primary) hypertension: Secondary | ICD-10-CM | POA: Diagnosis not present

## 2022-04-03 DIAGNOSIS — Z87440 Personal history of urinary (tract) infections: Secondary | ICD-10-CM | POA: Diagnosis not present

## 2022-04-03 DIAGNOSIS — G629 Polyneuropathy, unspecified: Secondary | ICD-10-CM | POA: Diagnosis not present

## 2022-04-03 DIAGNOSIS — G9589 Other specified diseases of spinal cord: Secondary | ICD-10-CM | POA: Diagnosis not present

## 2022-04-03 DIAGNOSIS — Z9181 History of falling: Secondary | ICD-10-CM | POA: Diagnosis not present

## 2022-04-03 DIAGNOSIS — M300 Polyarteritis nodosa: Secondary | ICD-10-CM | POA: Diagnosis not present

## 2022-04-03 DIAGNOSIS — M4802 Spinal stenosis, cervical region: Secondary | ICD-10-CM | POA: Diagnosis not present

## 2022-04-03 DIAGNOSIS — E785 Hyperlipidemia, unspecified: Secondary | ICD-10-CM | POA: Diagnosis not present

## 2022-04-03 DIAGNOSIS — G9529 Other cord compression: Secondary | ICD-10-CM | POA: Diagnosis not present

## 2022-04-03 DIAGNOSIS — K259 Gastric ulcer, unspecified as acute or chronic, without hemorrhage or perforation: Secondary | ICD-10-CM | POA: Diagnosis not present

## 2022-04-03 DIAGNOSIS — Z993 Dependence on wheelchair: Secondary | ICD-10-CM | POA: Diagnosis not present

## 2022-04-05 ENCOUNTER — Telehealth: Payer: Self-pay | Admitting: Family Medicine

## 2022-04-05 ENCOUNTER — Telehealth: Payer: Medicare Other | Admitting: Family Medicine

## 2022-04-05 DIAGNOSIS — M4802 Spinal stenosis, cervical region: Secondary | ICD-10-CM | POA: Diagnosis not present

## 2022-04-05 DIAGNOSIS — S81811D Laceration without foreign body, right lower leg, subsequent encounter: Secondary | ICD-10-CM | POA: Diagnosis not present

## 2022-04-05 DIAGNOSIS — I10 Essential (primary) hypertension: Secondary | ICD-10-CM | POA: Diagnosis not present

## 2022-04-05 DIAGNOSIS — K760 Fatty (change of) liver, not elsewhere classified: Secondary | ICD-10-CM | POA: Diagnosis not present

## 2022-04-05 DIAGNOSIS — M300 Polyarteritis nodosa: Secondary | ICD-10-CM | POA: Diagnosis not present

## 2022-04-05 DIAGNOSIS — M797 Fibromyalgia: Secondary | ICD-10-CM | POA: Diagnosis not present

## 2022-04-05 DIAGNOSIS — F32A Depression, unspecified: Secondary | ICD-10-CM | POA: Diagnosis not present

## 2022-04-05 DIAGNOSIS — E559 Vitamin D deficiency, unspecified: Secondary | ICD-10-CM | POA: Diagnosis not present

## 2022-04-05 DIAGNOSIS — I872 Venous insufficiency (chronic) (peripheral): Secondary | ICD-10-CM | POA: Diagnosis not present

## 2022-04-05 DIAGNOSIS — M5136 Other intervertebral disc degeneration, lumbar region: Secondary | ICD-10-CM | POA: Diagnosis not present

## 2022-04-05 DIAGNOSIS — Z87440 Personal history of urinary (tract) infections: Secondary | ICD-10-CM | POA: Diagnosis not present

## 2022-04-05 DIAGNOSIS — Z993 Dependence on wheelchair: Secondary | ICD-10-CM | POA: Diagnosis not present

## 2022-04-05 DIAGNOSIS — K219 Gastro-esophageal reflux disease without esophagitis: Secondary | ICD-10-CM | POA: Diagnosis not present

## 2022-04-05 DIAGNOSIS — E538 Deficiency of other specified B group vitamins: Secondary | ICD-10-CM | POA: Diagnosis not present

## 2022-04-05 DIAGNOSIS — G43909 Migraine, unspecified, not intractable, without status migrainosus: Secondary | ICD-10-CM | POA: Diagnosis not present

## 2022-04-05 DIAGNOSIS — E785 Hyperlipidemia, unspecified: Secondary | ICD-10-CM | POA: Diagnosis not present

## 2022-04-05 DIAGNOSIS — Z9181 History of falling: Secondary | ICD-10-CM | POA: Diagnosis not present

## 2022-04-05 DIAGNOSIS — G4733 Obstructive sleep apnea (adult) (pediatric): Secondary | ICD-10-CM | POA: Diagnosis not present

## 2022-04-05 DIAGNOSIS — G629 Polyneuropathy, unspecified: Secondary | ICD-10-CM | POA: Diagnosis not present

## 2022-04-05 DIAGNOSIS — G9529 Other cord compression: Secondary | ICD-10-CM | POA: Diagnosis not present

## 2022-04-05 DIAGNOSIS — K259 Gastric ulcer, unspecified as acute or chronic, without hemorrhage or perforation: Secondary | ICD-10-CM | POA: Diagnosis not present

## 2022-04-05 DIAGNOSIS — G9589 Other specified diseases of spinal cord: Secondary | ICD-10-CM | POA: Diagnosis not present

## 2022-04-05 NOTE — Telephone Encounter (Signed)
Ok for verbal order  °

## 2022-04-05 NOTE — Telephone Encounter (Signed)
Is it ok to give verbal order for PT ?

## 2022-04-05 NOTE — Telephone Encounter (Signed)
Attempted to call and give verbals however phone number when dialed stated not in service.

## 2022-04-05 NOTE — Telephone Encounter (Signed)
Caller name: Lorna Few from [facility]   On DPR?: NO  Call back number: [phone]  Provider they see: [name], MD  Reason for call:  Verbal orders: PT twice a week for four weeks starting 04/10/22.

## 2022-04-06 DIAGNOSIS — G43909 Migraine, unspecified, not intractable, without status migrainosus: Secondary | ICD-10-CM | POA: Diagnosis not present

## 2022-04-06 DIAGNOSIS — G9589 Other specified diseases of spinal cord: Secondary | ICD-10-CM | POA: Diagnosis not present

## 2022-04-06 DIAGNOSIS — K760 Fatty (change of) liver, not elsewhere classified: Secondary | ICD-10-CM | POA: Diagnosis not present

## 2022-04-06 DIAGNOSIS — E538 Deficiency of other specified B group vitamins: Secondary | ICD-10-CM | POA: Diagnosis not present

## 2022-04-06 DIAGNOSIS — G9529 Other cord compression: Secondary | ICD-10-CM | POA: Diagnosis not present

## 2022-04-06 DIAGNOSIS — Z993 Dependence on wheelchair: Secondary | ICD-10-CM | POA: Diagnosis not present

## 2022-04-06 DIAGNOSIS — M4802 Spinal stenosis, cervical region: Secondary | ICD-10-CM | POA: Diagnosis not present

## 2022-04-06 DIAGNOSIS — G629 Polyneuropathy, unspecified: Secondary | ICD-10-CM | POA: Diagnosis not present

## 2022-04-06 DIAGNOSIS — M797 Fibromyalgia: Secondary | ICD-10-CM | POA: Diagnosis not present

## 2022-04-06 DIAGNOSIS — M5136 Other intervertebral disc degeneration, lumbar region: Secondary | ICD-10-CM | POA: Diagnosis not present

## 2022-04-06 DIAGNOSIS — I10 Essential (primary) hypertension: Secondary | ICD-10-CM | POA: Diagnosis not present

## 2022-04-06 DIAGNOSIS — G4733 Obstructive sleep apnea (adult) (pediatric): Secondary | ICD-10-CM | POA: Diagnosis not present

## 2022-04-06 DIAGNOSIS — E559 Vitamin D deficiency, unspecified: Secondary | ICD-10-CM | POA: Diagnosis not present

## 2022-04-06 DIAGNOSIS — Z9181 History of falling: Secondary | ICD-10-CM | POA: Diagnosis not present

## 2022-04-06 DIAGNOSIS — K259 Gastric ulcer, unspecified as acute or chronic, without hemorrhage or perforation: Secondary | ICD-10-CM | POA: Diagnosis not present

## 2022-04-06 DIAGNOSIS — M300 Polyarteritis nodosa: Secondary | ICD-10-CM | POA: Diagnosis not present

## 2022-04-06 DIAGNOSIS — I872 Venous insufficiency (chronic) (peripheral): Secondary | ICD-10-CM | POA: Diagnosis not present

## 2022-04-06 DIAGNOSIS — Z87440 Personal history of urinary (tract) infections: Secondary | ICD-10-CM | POA: Diagnosis not present

## 2022-04-06 DIAGNOSIS — S81811D Laceration without foreign body, right lower leg, subsequent encounter: Secondary | ICD-10-CM | POA: Diagnosis not present

## 2022-04-06 DIAGNOSIS — E785 Hyperlipidemia, unspecified: Secondary | ICD-10-CM | POA: Diagnosis not present

## 2022-04-06 DIAGNOSIS — F32A Depression, unspecified: Secondary | ICD-10-CM | POA: Diagnosis not present

## 2022-04-06 DIAGNOSIS — K219 Gastro-esophageal reflux disease without esophagitis: Secondary | ICD-10-CM | POA: Diagnosis not present

## 2022-04-08 ENCOUNTER — Other Ambulatory Visit: Payer: Self-pay | Admitting: Family Medicine

## 2022-04-09 DIAGNOSIS — E871 Hypo-osmolality and hyponatremia: Secondary | ICD-10-CM | POA: Diagnosis not present

## 2022-04-09 DIAGNOSIS — G4733 Obstructive sleep apnea (adult) (pediatric): Secondary | ICD-10-CM | POA: Diagnosis not present

## 2022-04-09 DIAGNOSIS — A419 Sepsis, unspecified organism: Secondary | ICD-10-CM | POA: Diagnosis not present

## 2022-04-11 ENCOUNTER — Telehealth: Payer: Self-pay | Admitting: Family Medicine

## 2022-04-11 DIAGNOSIS — M300 Polyarteritis nodosa: Secondary | ICD-10-CM | POA: Diagnosis not present

## 2022-04-11 DIAGNOSIS — G4733 Obstructive sleep apnea (adult) (pediatric): Secondary | ICD-10-CM | POA: Diagnosis not present

## 2022-04-11 DIAGNOSIS — G43909 Migraine, unspecified, not intractable, without status migrainosus: Secondary | ICD-10-CM | POA: Diagnosis not present

## 2022-04-11 DIAGNOSIS — Z79899 Other long term (current) drug therapy: Secondary | ICD-10-CM | POA: Diagnosis not present

## 2022-04-11 DIAGNOSIS — Z993 Dependence on wheelchair: Secondary | ICD-10-CM | POA: Diagnosis not present

## 2022-04-11 DIAGNOSIS — M4802 Spinal stenosis, cervical region: Secondary | ICD-10-CM | POA: Diagnosis not present

## 2022-04-11 DIAGNOSIS — L409 Psoriasis, unspecified: Secondary | ICD-10-CM | POA: Diagnosis not present

## 2022-04-11 DIAGNOSIS — Z79891 Long term (current) use of opiate analgesic: Secondary | ICD-10-CM | POA: Diagnosis not present

## 2022-04-11 DIAGNOSIS — G9589 Other specified diseases of spinal cord: Secondary | ICD-10-CM | POA: Diagnosis not present

## 2022-04-11 DIAGNOSIS — E785 Hyperlipidemia, unspecified: Secondary | ICD-10-CM | POA: Diagnosis not present

## 2022-04-11 DIAGNOSIS — Z9181 History of falling: Secondary | ICD-10-CM | POA: Diagnosis not present

## 2022-04-11 DIAGNOSIS — M5136 Other intervertebral disc degeneration, lumbar region: Secondary | ICD-10-CM | POA: Diagnosis not present

## 2022-04-11 DIAGNOSIS — K760 Fatty (change of) liver, not elsewhere classified: Secondary | ICD-10-CM | POA: Diagnosis not present

## 2022-04-11 DIAGNOSIS — G629 Polyneuropathy, unspecified: Secondary | ICD-10-CM | POA: Diagnosis not present

## 2022-04-11 DIAGNOSIS — E559 Vitamin D deficiency, unspecified: Secondary | ICD-10-CM | POA: Diagnosis not present

## 2022-04-11 DIAGNOSIS — E538 Deficiency of other specified B group vitamins: Secondary | ICD-10-CM | POA: Diagnosis not present

## 2022-04-11 DIAGNOSIS — M797 Fibromyalgia: Secondary | ICD-10-CM | POA: Diagnosis not present

## 2022-04-11 DIAGNOSIS — I10 Essential (primary) hypertension: Secondary | ICD-10-CM | POA: Diagnosis not present

## 2022-04-11 DIAGNOSIS — K219 Gastro-esophageal reflux disease without esophagitis: Secondary | ICD-10-CM | POA: Diagnosis not present

## 2022-04-11 DIAGNOSIS — F32A Depression, unspecified: Secondary | ICD-10-CM | POA: Diagnosis not present

## 2022-04-11 DIAGNOSIS — G9529 Other cord compression: Secondary | ICD-10-CM | POA: Diagnosis not present

## 2022-04-11 DIAGNOSIS — I872 Venous insufficiency (chronic) (peripheral): Secondary | ICD-10-CM | POA: Diagnosis not present

## 2022-04-11 DIAGNOSIS — Z87440 Personal history of urinary (tract) infections: Secondary | ICD-10-CM | POA: Diagnosis not present

## 2022-04-11 NOTE — Telephone Encounter (Signed)
Paper work was dropped off for power scooter. Per Dr Birdie Riddle patient needs a visit to discuss. I have called patient and set her up for a virtual appt 11/28, paper work was given to Dr Birdie Riddle.

## 2022-04-12 ENCOUNTER — Encounter: Payer: Self-pay | Admitting: Family Medicine

## 2022-04-12 ENCOUNTER — Telehealth (INDEPENDENT_AMBULATORY_CARE_PROVIDER_SITE_OTHER): Payer: Medicare Other | Admitting: Family Medicine

## 2022-04-12 DIAGNOSIS — L409 Psoriasis, unspecified: Secondary | ICD-10-CM | POA: Diagnosis not present

## 2022-04-12 DIAGNOSIS — M4802 Spinal stenosis, cervical region: Secondary | ICD-10-CM | POA: Diagnosis not present

## 2022-04-12 DIAGNOSIS — G9589 Other specified diseases of spinal cord: Secondary | ICD-10-CM

## 2022-04-12 DIAGNOSIS — Z9181 History of falling: Secondary | ICD-10-CM | POA: Diagnosis not present

## 2022-04-12 DIAGNOSIS — I10 Essential (primary) hypertension: Secondary | ICD-10-CM | POA: Diagnosis not present

## 2022-04-12 DIAGNOSIS — I872 Venous insufficiency (chronic) (peripheral): Secondary | ICD-10-CM | POA: Diagnosis not present

## 2022-04-12 DIAGNOSIS — G43909 Migraine, unspecified, not intractable, without status migrainosus: Secondary | ICD-10-CM | POA: Diagnosis not present

## 2022-04-12 DIAGNOSIS — M300 Polyarteritis nodosa: Secondary | ICD-10-CM | POA: Diagnosis not present

## 2022-04-12 DIAGNOSIS — E785 Hyperlipidemia, unspecified: Secondary | ICD-10-CM | POA: Diagnosis not present

## 2022-04-12 DIAGNOSIS — M5136 Other intervertebral disc degeneration, lumbar region: Secondary | ICD-10-CM | POA: Diagnosis not present

## 2022-04-12 DIAGNOSIS — E559 Vitamin D deficiency, unspecified: Secondary | ICD-10-CM | POA: Diagnosis not present

## 2022-04-12 DIAGNOSIS — G4733 Obstructive sleep apnea (adult) (pediatric): Secondary | ICD-10-CM | POA: Diagnosis not present

## 2022-04-12 DIAGNOSIS — K219 Gastro-esophageal reflux disease without esophagitis: Secondary | ICD-10-CM | POA: Diagnosis not present

## 2022-04-12 DIAGNOSIS — E538 Deficiency of other specified B group vitamins: Secondary | ICD-10-CM | POA: Diagnosis not present

## 2022-04-12 DIAGNOSIS — Z87440 Personal history of urinary (tract) infections: Secondary | ICD-10-CM | POA: Diagnosis not present

## 2022-04-12 DIAGNOSIS — G629 Polyneuropathy, unspecified: Secondary | ICD-10-CM | POA: Diagnosis not present

## 2022-04-12 DIAGNOSIS — Z79891 Long term (current) use of opiate analgesic: Secondary | ICD-10-CM | POA: Diagnosis not present

## 2022-04-12 DIAGNOSIS — F32A Depression, unspecified: Secondary | ICD-10-CM | POA: Diagnosis not present

## 2022-04-12 DIAGNOSIS — M797 Fibromyalgia: Secondary | ICD-10-CM | POA: Diagnosis not present

## 2022-04-12 DIAGNOSIS — K760 Fatty (change of) liver, not elsewhere classified: Secondary | ICD-10-CM | POA: Diagnosis not present

## 2022-04-12 DIAGNOSIS — Z79899 Other long term (current) drug therapy: Secondary | ICD-10-CM | POA: Diagnosis not present

## 2022-04-12 DIAGNOSIS — G9529 Other cord compression: Secondary | ICD-10-CM | POA: Diagnosis not present

## 2022-04-12 DIAGNOSIS — Z993 Dependence on wheelchair: Secondary | ICD-10-CM | POA: Diagnosis not present

## 2022-04-12 NOTE — Progress Notes (Signed)
Virtual Visit via Video   I connected with patient on 04/12/22 at 10:00 AM EST by a video enabled telemedicine application and verified that I am speaking with the correct person using two identifiers.  Location patient: Home Location provider: [facility], Office Persons participating in the virtual visit: Patient, Provider, [place] Marcille Blanco C)  I discussed the limitations of evaluation and management by telemedicine and the availability of in person appointments. The patient expressed understanding and agreed to proceed.  Subjective:   HPI:   Mobility issues- pt has been bed bound for ~1 yr.  Participating in PT 2x/week and OT 1x/week.  Pt is able to get to a standing position but cannot safely ambulate using cane or walker due to leg weakness.  Unable to self propel a wheelchair due to body habitus/size.  Unable to use a basic scooter due to difficulty w/ transfers.  With the use of a power scooter, would be able to be out of bed, go to other rooms of the house- such as the kitchen to prepare food.  Would be able to get outside as there are no steps into the home.  Per PT report UE strength 4+ but LE strength 3-4.  Pain level is consistently 5/10 in both legs.  ROS:   See pertinent positives and negatives per HPI.  Patient Active Problem List   Diagnosis Date Noted   Leg cramps 01/12/2022   Positive blood cultures 08/14/2021   PNA (pneumonia) 08/12/2021   Fever of unknown origin 08/12/2021   Sacral pressure sore 07/06/2021   Vitamin B6 deficiency 06/08/2021   Myelomalacia of cervical cord ([place]) 06/06/2021   Compressive cervical cord myelomalacia ([place]) 06/05/2021   B12 deficiency 06/05/2021   Folate deficiency 06/05/2021   Vitamin D deficiency 06/05/2021   Leg weakness, bilateral 06/04/2021   Cervical atypia 01/06/2021   Steatosis of liver 01/06/2021   Hyponatremia 07/27/2019   Urinary urgency 04/04/2018   Chronic narcotic use 09/07/2016   Acute blood loss anemia  01/14/2016   Multiple gastric ulcers    PAN (polyarteritis nodosa) ([place]) 11/24/2015   GERD (gastroesophageal reflux disease) 01/08/2014   Routine general medical examination at a health care facility 04/23/2013   Bariatric surgery status 10/31/2012   S/P skin biopsy 10/31/2012   Psoriasis 01/09/2012   DDD (degenerative disc disease), lumbar 11/30/2011   Migraine headache    [name] (obstructive sleep apnea) 11/16/2010   HYPERGLYCEMIA, FASTING 10/08/2009   CONTRACTURE OF TENDON 08/17/2009   PARESTHESIA, HANDS 12/23/2008   Leg pain, right 11/05/2008   ADVERSE DRUG REACTION 04/03/2008   PERIPHERAL EDEMA 03/26/2008   Obesity, Class III, BMI 40-49.9 (morbid obesity) ([place]) 01/03/2007   Cellulitis 01/03/2007   Hyperlipidemia 09/22/2006   Fibromyalgia 09/20/2006   Anxiety and depression 06/28/2006   Essential hypertension 06/28/2006    Social History   Tobacco Use   Smoking status: Never   Smokeless tobacco: Never  Substance Use Topics   Alcohol use: No    Current Outpatient Medications:    ALPRAZolam (XANAX) 0.5 MG tablet, TAKE 1 TABLET BY MOUTH 2 TIMES DAILY, Disp: 30 tablet, Rfl: 3   ARIPiprazole (ABILIFY) 15 MG tablet, TAKE 1 TABLET BY MOUTH EVERY DAY, Disp: 90 tablet, Rfl: 1   Armodafinil 250 MG tablet, Take 250 mg by mouth in the morning and at bedtime., Disp: , Rfl:    Biotin 10 MG TABS, Take 10 mg by mouth in the morning. , Disp: , Rfl:    buPROPion (WELLBUTRIN XL) 300  MG 24 hr tablet, TAKE 1 TABLET BY MOUTH EVERY DAY, Disp: 30 tablet, Rfl: 2   calcium citrate (CALCITRATE - DOSED IN MG ELEMENTAL CALCIUM) 950 MG tablet, Take 1 tablet by mouth daily., Disp: , Rfl:    clonazePAM (KLONOPIN) 1 MG tablet, TAKE 1 TABLET BY MOUTH AT BEDTIME, Disp: 30 tablet, Rfl: 3   cyanocobalamin (VITAMIN B12) 1000 MCG tablet, TAKE 1 TABLET BY MOUTH EVERY DAY, Disp: 30 tablet, Rfl: 2   cyclobenzaprine (FLEXERIL) 10 MG tablet, TAKE 2 TABLETS BY MOUTH 2 TIMES DAILY, Disp: 120 tablet, Rfl: 2    diclofenac Sodium (VOLTAREN) 1 % GEL, Apply 4 g topically 4 (four) times daily., Disp: , Rfl:    diphenhydrAMINE (BENADRYL) 25 MG tablet, Take 25 mg by mouth 2 (two) times daily as needed for allergies., Disp: , Rfl:    DULoxetine (CYMBALTA) 60 MG capsule, Take 2 capsules (120 mg total) by mouth daily. (Patient taking differently: Take 60 mg by mouth 2 (two) times daily.), Disp: 240 capsule, Rfl: 2   FEROSUL 325 (65 Fe) MG tablet, TAKE 1 TABLET BY MOUTH EVERY DAY WITH BREAKFAST, Disp: 30 tablet, Rfl: 2   folic acid (FOLVITE) 1 MG tablet, TAKE 1 TABLET BY MOUTH EVERY DAY, Disp: 30 tablet, Rfl: 2   gabapentin (NEURONTIN) 600 MG tablet, Take 1 tablet (600 mg total) by mouth 3 (three) times daily. TAKE 2 TABLETS BY MOUTH 3 TIMES DAILY (Patient taking differently: Take 600 mg by mouth 3 (three) times daily.), Disp: 270 tablet, Rfl: 0   GNP VITAMIN B-1 100 MG tablet, TAKE 1 TABLET BY MOUTH EVERY DAY, Disp: 30 tablet, Rfl: 2   HYDROcodone-acetaminophen (NORCO) 10-325 MG tablet, Take 1 tablet by mouth every 8 (eight) hours as needed for up to 90 doses., Disp: 90 tablet, Rfl: 0   hydrOXYzine (VISTARIL) 25 MG capsule, TAKE 1 CAPSULE BY MOUTH EVERY 8 HOURS AS NEEDED, Disp: 60 capsule, Rfl: 0   nebivolol (BYSTOLIC) 10 MG tablet, TAKE 1 TABLET BY MOUTH EVERY DAY, Disp: 30 tablet, Rfl: 2   nystatin (MYCOSTATIN/NYSTOP) powder, APPLY 1 APPLICATION TOPICALLY 3 TIMES DAILY, Disp: 60 g, Rfl: 3   Oxcarbazepine (TRILEPTAL) 300 MG tablet, Take 300 mg by mouth 2 (two) times daily., Disp: , Rfl:    pantoprazole (PROTONIX) 40 MG tablet, TAKE 1 TABLET BY MOUTH 2 TIMES DAILY, Disp: 60 tablet, Rfl: 2   promethazine (PHENERGAN) 25 MG tablet, TAKE 1 TABLET BY MOUTH EVERY 6 HOURS AS NEEDED FOR NAUSEA AND VOMITING, Disp: 45 tablet, Rfl: 3   pyridOXINE (VITAMIN B6) 100 MG tablet, TAKE 1 TABLET BY MOUTH EVERY DAY, Disp: 30 tablet, Rfl: 2   rosuvastatin (CRESTOR) 20 MG tablet, TAKE 1 TABLET BY MOUTH EVERY DAY, Disp: 90 tablet, Rfl: 2    SKYRIZI 150 MG/ML SOSY, Inject 150 mg into the skin every 3 (three) months., Disp: , Rfl:    phentermine 37.5 MG capsule, Take 1 capsule (37.5 mg total) by mouth every morning., Disp: 30 capsule, Rfl: 2  Allergies  Allergen Reactions   Niacin Anaphylaxis, Shortness Of Breath and Swelling    Swelling and "problems breathing"   Sulfamethoxazole-Trimethoprim Anaphylaxis, Swelling, Rash and Other (See Comments)    Throat closed and the eyes became swollen   Aspirin Hives   Bactrim Hives   Benzoin Compound Rash   Cephalexin Hives    Tolerated ceftriaxone and cefepime 07/2019   Iohexol Hives    Pt treated with PO benedryl   Lisinopril Hives  Naproxen Hives   Pnu-Imune [Pneumococcal Polysaccharide Vaccine] Rash   Sulfonamide Derivatives Rash    Objective:   There were no vitals taken for this visit. AAOx3, NAD Morbidly obese NCAT, EOMI No obvious CN deficits Coloring WNL Pt is able to speak clearly, coherently without shortness of breath or increased work of breathing.  Thought process is linear.  Mood is appropriate.   Assessment and Plan:   Compressive cervical cord myelomalacia- ongoing issue for pt.  She has been bed bound for ~1 yr at this time.  She is now able to get to a standing position but is unable to ambulate due to leg weakness.  She needs a power scooter to allow her the ability to move about the house.  She is not able to use a manual wheelchair due to her body habitus but a power scooter would allow her more freedom to do her ADLs- including toileting independently.  Prescription completed and pt has had required PT evaluation.  Will fax forms to [facility].   [name], MD 04/12/2022

## 2022-04-13 ENCOUNTER — Other Ambulatory Visit: Payer: Self-pay | Admitting: Family Medicine

## 2022-04-13 MED ORDER — HYDROCODONE-ACETAMINOPHEN 10-325 MG PO TABS: 1.0000 | ORAL_TABLET | Freq: Three times a day (TID) | ORAL | 0 refills | Status: DC | PRN

## 2022-04-13 NOTE — Telephone Encounter (Signed)
Patient is requesting a refill of the following medications: Requested Prescriptions   Pending Prescriptions Disp Refills   HYDROcodone-acetaminophen (NORCO) 10-325 MG tablet 90 tablet 0    Sig: Take 1 tablet by mouth every 8 (eight) hours as needed for up to 90 doses.    Date of patient request: 04/13/22 Last office visit: 04/12/22 Date of last refill: 03/10/22 Last refill amount: 90

## 2022-04-14 ENCOUNTER — Telehealth: Payer: Self-pay | Admitting: Family Medicine

## 2022-04-14 DIAGNOSIS — E785 Hyperlipidemia, unspecified: Secondary | ICD-10-CM | POA: Diagnosis not present

## 2022-04-14 DIAGNOSIS — G9589 Other specified diseases of spinal cord: Secondary | ICD-10-CM | POA: Diagnosis not present

## 2022-04-14 DIAGNOSIS — F32A Depression, unspecified: Secondary | ICD-10-CM | POA: Diagnosis not present

## 2022-04-14 DIAGNOSIS — G629 Polyneuropathy, unspecified: Secondary | ICD-10-CM | POA: Diagnosis not present

## 2022-04-14 DIAGNOSIS — Z9181 History of falling: Secondary | ICD-10-CM | POA: Diagnosis not present

## 2022-04-14 DIAGNOSIS — Z87440 Personal history of urinary (tract) infections: Secondary | ICD-10-CM | POA: Diagnosis not present

## 2022-04-14 DIAGNOSIS — G43909 Migraine, unspecified, not intractable, without status migrainosus: Secondary | ICD-10-CM | POA: Diagnosis not present

## 2022-04-14 DIAGNOSIS — I872 Venous insufficiency (chronic) (peripheral): Secondary | ICD-10-CM | POA: Diagnosis not present

## 2022-04-14 DIAGNOSIS — M4802 Spinal stenosis, cervical region: Secondary | ICD-10-CM | POA: Diagnosis not present

## 2022-04-14 DIAGNOSIS — Z993 Dependence on wheelchair: Secondary | ICD-10-CM | POA: Diagnosis not present

## 2022-04-14 DIAGNOSIS — E559 Vitamin D deficiency, unspecified: Secondary | ICD-10-CM | POA: Diagnosis not present

## 2022-04-14 DIAGNOSIS — G9529 Other cord compression: Secondary | ICD-10-CM | POA: Diagnosis not present

## 2022-04-14 DIAGNOSIS — G4733 Obstructive sleep apnea (adult) (pediatric): Secondary | ICD-10-CM | POA: Diagnosis not present

## 2022-04-14 DIAGNOSIS — Z79899 Other long term (current) drug therapy: Secondary | ICD-10-CM | POA: Diagnosis not present

## 2022-04-14 DIAGNOSIS — M300 Polyarteritis nodosa: Secondary | ICD-10-CM | POA: Diagnosis not present

## 2022-04-14 DIAGNOSIS — I10 Essential (primary) hypertension: Secondary | ICD-10-CM | POA: Diagnosis not present

## 2022-04-14 DIAGNOSIS — M797 Fibromyalgia: Secondary | ICD-10-CM | POA: Diagnosis not present

## 2022-04-14 DIAGNOSIS — K760 Fatty (change of) liver, not elsewhere classified: Secondary | ICD-10-CM | POA: Diagnosis not present

## 2022-04-14 DIAGNOSIS — L409 Psoriasis, unspecified: Secondary | ICD-10-CM | POA: Diagnosis not present

## 2022-04-14 DIAGNOSIS — Z79891 Long term (current) use of opiate analgesic: Secondary | ICD-10-CM | POA: Diagnosis not present

## 2022-04-14 DIAGNOSIS — M5136 Other intervertebral disc degeneration, lumbar region: Secondary | ICD-10-CM | POA: Diagnosis not present

## 2022-04-14 DIAGNOSIS — E538 Deficiency of other specified B group vitamins: Secondary | ICD-10-CM | POA: Diagnosis not present

## 2022-04-14 DIAGNOSIS — K219 Gastro-esophageal reflux disease without esophagitis: Secondary | ICD-10-CM | POA: Diagnosis not present

## 2022-04-14 NOTE — Telephone Encounter (Signed)
Received home health form from [facility]. Placed in [place] with charge sheet.

## 2022-04-14 NOTE — Telephone Encounter (Signed)
Just needs signature from the provider, placed in your to be signed folder

## 2022-04-19 DIAGNOSIS — M4802 Spinal stenosis, cervical region: Secondary | ICD-10-CM | POA: Diagnosis not present

## 2022-04-19 DIAGNOSIS — M5136 Other intervertebral disc degeneration, lumbar region: Secondary | ICD-10-CM | POA: Diagnosis not present

## 2022-04-19 DIAGNOSIS — G629 Polyneuropathy, unspecified: Secondary | ICD-10-CM | POA: Diagnosis not present

## 2022-04-19 DIAGNOSIS — G9529 Other cord compression: Secondary | ICD-10-CM | POA: Diagnosis not present

## 2022-04-19 DIAGNOSIS — M797 Fibromyalgia: Secondary | ICD-10-CM | POA: Diagnosis not present

## 2022-04-19 DIAGNOSIS — E538 Deficiency of other specified B group vitamins: Secondary | ICD-10-CM | POA: Diagnosis not present

## 2022-04-19 DIAGNOSIS — G43909 Migraine, unspecified, not intractable, without status migrainosus: Secondary | ICD-10-CM | POA: Diagnosis not present

## 2022-04-19 DIAGNOSIS — G9589 Other specified diseases of spinal cord: Secondary | ICD-10-CM | POA: Diagnosis not present

## 2022-04-19 DIAGNOSIS — L409 Psoriasis, unspecified: Secondary | ICD-10-CM | POA: Diagnosis not present

## 2022-04-19 DIAGNOSIS — M1711 Unilateral primary osteoarthritis, right knee: Secondary | ICD-10-CM | POA: Diagnosis not present

## 2022-04-19 DIAGNOSIS — Z993 Dependence on wheelchair: Secondary | ICD-10-CM | POA: Diagnosis not present

## 2022-04-19 DIAGNOSIS — Z87440 Personal history of urinary (tract) infections: Secondary | ICD-10-CM | POA: Diagnosis not present

## 2022-04-19 DIAGNOSIS — Z9181 History of falling: Secondary | ICD-10-CM | POA: Diagnosis not present

## 2022-04-19 DIAGNOSIS — I10 Essential (primary) hypertension: Secondary | ICD-10-CM | POA: Diagnosis not present

## 2022-04-19 DIAGNOSIS — K760 Fatty (change of) liver, not elsewhere classified: Secondary | ICD-10-CM | POA: Diagnosis not present

## 2022-04-19 DIAGNOSIS — I872 Venous insufficiency (chronic) (peripheral): Secondary | ICD-10-CM | POA: Diagnosis not present

## 2022-04-19 DIAGNOSIS — K219 Gastro-esophageal reflux disease without esophagitis: Secondary | ICD-10-CM | POA: Diagnosis not present

## 2022-04-19 DIAGNOSIS — F32A Depression, unspecified: Secondary | ICD-10-CM | POA: Diagnosis not present

## 2022-04-19 DIAGNOSIS — Z79899 Other long term (current) drug therapy: Secondary | ICD-10-CM | POA: Diagnosis not present

## 2022-04-19 DIAGNOSIS — Z79891 Long term (current) use of opiate analgesic: Secondary | ICD-10-CM | POA: Diagnosis not present

## 2022-04-19 DIAGNOSIS — E785 Hyperlipidemia, unspecified: Secondary | ICD-10-CM | POA: Diagnosis not present

## 2022-04-19 DIAGNOSIS — E559 Vitamin D deficiency, unspecified: Secondary | ICD-10-CM | POA: Diagnosis not present

## 2022-04-19 DIAGNOSIS — M300 Polyarteritis nodosa: Secondary | ICD-10-CM | POA: Diagnosis not present

## 2022-04-19 DIAGNOSIS — G4733 Obstructive sleep apnea (adult) (pediatric): Secondary | ICD-10-CM | POA: Diagnosis not present

## 2022-04-20 ENCOUNTER — Encounter: Payer: Self-pay | Admitting: Family Medicine

## 2022-04-20 ENCOUNTER — Telehealth (INDEPENDENT_AMBULATORY_CARE_PROVIDER_SITE_OTHER): Payer: Medicare Other | Admitting: Family Medicine

## 2022-04-20 DIAGNOSIS — R300 Dysuria: Secondary | ICD-10-CM

## 2022-04-20 DIAGNOSIS — R0989 Other specified symptoms and signs involving the circulatory and respiratory systems: Secondary | ICD-10-CM

## 2022-04-20 MED ORDER — NITROFURANTOIN MONOHYD MACRO 100 MG PO CAPS: 100.0000 mg | ORAL_CAPSULE | Freq: Two times a day (BID) | ORAL | 0 refills | Status: AC

## 2022-04-20 NOTE — Progress Notes (Signed)
Virtual Visit via Video   I connected with patient on 04/20/22 at 11:00 AM EST by a video enabled telemedicine application and verified that I am speaking with the correct person using two identifiers.  Location patient: Home Location provider: [facility], Office Persons participating in the virtual visit: Patient, Provider, [place] Marcille Blanco C)  I discussed the limitations of evaluation and management by telemedicine and the availability of in person appointments. The patient expressed understanding and agreed to proceed.  Subjective:   HPI:   Chest congestion- pt is taking Mucinex, completed the Amoxicillin that was prescribed on 11/16.  No fevers.  Denies SOB.  Cough is not productive.  Pt reports feeling ok.    Pain w/ urination- sxs started Saturday.  Pt reports increased frequency.  Urine is cloudy.  No nausea.  ROS:   See pertinent positives and negatives per HPI.  Patient Active Problem List   Diagnosis Date Noted   Leg cramps 01/12/2022   Positive blood cultures 08/14/2021   PNA (pneumonia) 08/12/2021   Fever of unknown origin 08/12/2021   Sacral pressure sore 07/06/2021   Vitamin B6 deficiency 06/08/2021   Myelomalacia of cervical cord ([place]) 06/06/2021   Compressive cervical cord myelomalacia ([place]) 06/05/2021   B12 deficiency 06/05/2021   Folate deficiency 06/05/2021   Vitamin D deficiency 06/05/2021   Leg weakness, bilateral 06/04/2021   Cervical atypia 01/06/2021   Steatosis of liver 01/06/2021   Hyponatremia 07/27/2019   Urinary urgency 04/04/2018   Chronic narcotic use 09/07/2016   Acute blood loss anemia 01/14/2016   Multiple gastric ulcers    PAN (polyarteritis nodosa) ([place]) 11/24/2015   GERD (gastroesophageal reflux disease) 01/08/2014   Routine general medical examination at a health care facility 04/23/2013   Bariatric surgery status 10/31/2012   S/P skin biopsy 10/31/2012   Psoriasis 01/09/2012   DDD (degenerative disc disease), lumbar  11/30/2011   Migraine headache    [name] (obstructive sleep apnea) 11/16/2010   HYPERGLYCEMIA, FASTING 10/08/2009   CONTRACTURE OF TENDON 08/17/2009   PARESTHESIA, HANDS 12/23/2008   Leg pain, right 11/05/2008   ADVERSE DRUG REACTION 04/03/2008   PERIPHERAL EDEMA 03/26/2008   Obesity, Class III, BMI 40-49.9 (morbid obesity) ([name]) 01/03/2007   Cellulitis 01/03/2007   Hyperlipidemia 09/22/2006   Fibromyalgia 09/20/2006   Anxiety and depression 06/28/2006   Essential hypertension 06/28/2006    Social History   Tobacco Use   Smoking status: Never   Smokeless tobacco: Never  Substance Use Topics   Alcohol use: No    Current Outpatient Medications:    ALPRAZolam (XANAX) 0.5 MG tablet, TAKE 1 TABLET BY MOUTH 2 TIMES DAILY, Disp: 30 tablet, Rfl: 3   ARIPiprazole (ABILIFY) 15 MG tablet, TAKE 1 TABLET BY MOUTH EVERY DAY, Disp: 90 tablet, Rfl: 1   Armodafinil 250 MG tablet, Take 250 mg by mouth in the morning and at bedtime., Disp: , Rfl:    Biotin 10 MG TABS, Take 10 mg by mouth in the morning. , Disp: , Rfl:    buPROPion (WELLBUTRIN XL) 300 MG 24 hr tablet, TAKE 1 TABLET BY MOUTH EVERY DAY, Disp: 30 tablet, Rfl: 2   calcium citrate (CALCITRATE - DOSED IN MG ELEMENTAL CALCIUM) 950 MG tablet, Take 1 tablet by mouth daily., Disp: , Rfl:    clonazePAM (KLONOPIN) 1 MG tablet, TAKE 1 TABLET BY MOUTH AT BEDTIME, Disp: 30 tablet, Rfl: 3   cyanocobalamin (VITAMIN B12) 1000 MCG tablet, TAKE 1 TABLET BY MOUTH EVERY DAY, Disp: 30 tablet,  Rfl: 2   cyclobenzaprine (FLEXERIL) 10 MG tablet, TAKE 2 TABLETS BY MOUTH 2 TIMES DAILY, Disp: 120 tablet, Rfl: 2   diclofenac Sodium (VOLTAREN) 1 % GEL, Apply 4 g topically 4 (four) times daily., Disp: , Rfl:    diphenhydrAMINE (BENADRYL) 25 MG tablet, Take 25 mg by mouth 2 (two) times daily as needed for allergies., Disp: , Rfl:    DULoxetine (CYMBALTA) 60 MG capsule, Take 2 capsules (120 mg total) by mouth daily. (Patient taking differently: Take 60 mg by mouth 2  (two) times daily.), Disp: 240 capsule, Rfl: 2   FEROSUL 325 (65 Fe) MG tablet, TAKE 1 TABLET BY MOUTH EVERY DAY WITH BREAKFAST, Disp: 30 tablet, Rfl: 2   folic acid (FOLVITE) 1 MG tablet, TAKE 1 TABLET BY MOUTH EVERY DAY, Disp: 30 tablet, Rfl: 2   gabapentin (NEURONTIN) 600 MG tablet, Take 1 tablet (600 mg total) by mouth 3 (three) times daily. TAKE 2 TABLETS BY MOUTH 3 TIMES DAILY (Patient taking differently: Take 600 mg by mouth 3 (three) times daily.), Disp: 270 tablet, Rfl: 0   GNP VITAMIN B-1 100 MG tablet, TAKE 1 TABLET BY MOUTH EVERY DAY, Disp: 30 tablet, Rfl: 2   HYDROcodone-acetaminophen (NORCO) 10-325 MG tablet, Take 1 tablet by mouth every 8 (eight) hours as needed for up to 90 doses., Disp: 90 tablet, Rfl: 0   hydrOXYzine (VISTARIL) 25 MG capsule, TAKE 1 CAPSULE BY MOUTH EVERY 8 HOURS AS NEEDED, Disp: 60 capsule, Rfl: 0   nebivolol (BYSTOLIC) 10 MG tablet, TAKE 1 TABLET BY MOUTH EVERY DAY, Disp: 30 tablet, Rfl: 2   nystatin (MYCOSTATIN/NYSTOP) powder, APPLY 1 APPLICATION TOPICALLY 3 TIMES DAILY, Disp: 60 g, Rfl: 3   Oxcarbazepine (TRILEPTAL) 300 MG tablet, Take 300 mg by mouth 2 (two) times daily., Disp: , Rfl:    pantoprazole (PROTONIX) 40 MG tablet, TAKE 1 TABLET BY MOUTH 2 TIMES DAILY, Disp: 60 tablet, Rfl: 2   promethazine (PHENERGAN) 25 MG tablet, TAKE 1 TABLET BY MOUTH EVERY 6 HOURS AS NEEDED FOR NAUSEA AND VOMITING, Disp: 45 tablet, Rfl: 3   pyridOXINE (VITAMIN B6) 100 MG tablet, TAKE 1 TABLET BY MOUTH EVERY DAY, Disp: 30 tablet, Rfl: 2   rosuvastatin (CRESTOR) 20 MG tablet, TAKE 1 TABLET BY MOUTH EVERY DAY, Disp: 90 tablet, Rfl: 2   SKYRIZI 150 MG/ML SOSY, Inject 150 mg into the skin every 3 (three) months., Disp: , Rfl:    phentermine 37.5 MG capsule, Take 1 capsule (37.5 mg total) by mouth every morning., Disp: 30 capsule, Rfl: 2  Allergies  Allergen Reactions   Niacin Anaphylaxis, Shortness Of Breath and Swelling    Swelling and "problems breathing"    Sulfamethoxazole-Trimethoprim Anaphylaxis, Swelling, Rash and Other (See Comments)    Throat closed and the eyes became swollen   Aspirin Hives   Bactrim Hives   Benzoin Compound Rash   Cephalexin Hives    Tolerated ceftriaxone and cefepime 07/2019   Iohexol Hives    Pt treated with PO benedryl   Lisinopril Hives   Naproxen Hives   Pnu-Imune [Pneumococcal Polysaccharide Vaccine] Rash   Sulfonamide Derivatives Rash    Objective:   There were no vitals taken for this visit. AAOx3, NAD NCAT, EOMI No obvious CN deficits Coloring WNL Pt is able to speak clearly, coherently without shortness of breath or increased work of breathing. No coughing or wheezing heard Thought process is linear.  Mood is appropriate.   Assessment and Plan:   Chest congestion- suspect  this is residual congestion and cough from her recent illness.  She reports otherwise feeling well.  Discussed that spending more time upright to clear her chest would be helpful.  No need for additional tx at this time.  Pt expressed understanding and is in agreement w/ plan.   Dysuria- new.  Pt reports this feels very similar to previous UTIs w/ pain, frequency, urgency, and cloudy urine.  Start Macrobid.  Pt expressed understanding and is in agreement w/ plan.  [name], MD 04/20/2022

## 2022-04-21 ENCOUNTER — Inpatient Hospital Stay (HOSPITAL_COMMUNITY)
Admission: EM | Admit: 2022-04-21 | Discharge: 2022-04-24 | DRG: 871 | Disposition: A | Discharge status: Home Health | Payer: Medicare Other | Attending: Family Medicine | Admitting: Family Medicine

## 2022-04-21 ENCOUNTER — Other Ambulatory Visit: Payer: Self-pay

## 2022-04-21 ENCOUNTER — Encounter (HOSPITAL_COMMUNITY): Payer: Self-pay

## 2022-04-21 ENCOUNTER — Emergency Department (HOSPITAL_COMMUNITY): Payer: Medicare Other

## 2022-04-21 DIAGNOSIS — G9529 Other cord compression: Secondary | ICD-10-CM | POA: Diagnosis not present

## 2022-04-21 DIAGNOSIS — Z803 Family history of malignant neoplasm of breast: Secondary | ICD-10-CM

## 2022-04-21 DIAGNOSIS — Z8249 Family history of ischemic heart disease and other diseases of the circulatory system: Secondary | ICD-10-CM | POA: Diagnosis not present

## 2022-04-21 DIAGNOSIS — Z9181 History of falling: Secondary | ICD-10-CM | POA: Diagnosis not present

## 2022-04-21 DIAGNOSIS — J9611 Chronic respiratory failure with hypoxia: Secondary | ICD-10-CM | POA: Diagnosis present

## 2022-04-21 DIAGNOSIS — G4733 Obstructive sleep apnea (adult) (pediatric): Secondary | ICD-10-CM

## 2022-04-21 DIAGNOSIS — Z825 Family history of asthma and other chronic lower respiratory diseases: Secondary | ICD-10-CM

## 2022-04-21 DIAGNOSIS — L409 Psoriasis, unspecified: Secondary | ICD-10-CM | POA: Diagnosis not present

## 2022-04-21 DIAGNOSIS — E876 Hypokalemia: Secondary | ICD-10-CM

## 2022-04-21 DIAGNOSIS — Z1152 Encounter for screening for COVID-19: Secondary | ICD-10-CM

## 2022-04-21 DIAGNOSIS — Z79891 Long term (current) use of opiate analgesic: Secondary | ICD-10-CM

## 2022-04-21 DIAGNOSIS — Z791 Long term (current) use of non-steroidal anti-inflammatories (NSAID): Secondary | ICD-10-CM

## 2022-04-21 DIAGNOSIS — K219 Gastro-esophageal reflux disease without esophagitis: Secondary | ICD-10-CM | POA: Diagnosis not present

## 2022-04-21 DIAGNOSIS — G9341 Metabolic encephalopathy: Secondary | ICD-10-CM | POA: Diagnosis present

## 2022-04-21 DIAGNOSIS — F419 Anxiety disorder, unspecified: Secondary | ICD-10-CM | POA: Diagnosis not present

## 2022-04-21 DIAGNOSIS — Z87440 Personal history of urinary (tract) infections: Secondary | ICD-10-CM | POA: Diagnosis not present

## 2022-04-21 DIAGNOSIS — R0689 Other abnormalities of breathing: Secondary | ICD-10-CM | POA: Diagnosis not present

## 2022-04-21 DIAGNOSIS — Z7401 Bed confinement status: Secondary | ICD-10-CM | POA: Diagnosis not present

## 2022-04-21 DIAGNOSIS — J189 Pneumonia, unspecified organism: Secondary | ICD-10-CM

## 2022-04-21 DIAGNOSIS — G43909 Migraine, unspecified, not intractable, without status migrainosus: Secondary | ICD-10-CM | POA: Diagnosis not present

## 2022-04-21 DIAGNOSIS — M797 Fibromyalgia: Secondary | ICD-10-CM | POA: Diagnosis not present

## 2022-04-21 DIAGNOSIS — Z881 Allergy status to other antibiotic agents status: Secondary | ICD-10-CM

## 2022-04-21 DIAGNOSIS — F32A Depression, unspecified: Secondary | ICD-10-CM | POA: Diagnosis not present

## 2022-04-21 DIAGNOSIS — G9589 Other specified diseases of spinal cord: Secondary | ICD-10-CM | POA: Diagnosis not present

## 2022-04-21 DIAGNOSIS — R0902 Hypoxemia: Principal | ICD-10-CM

## 2022-04-21 DIAGNOSIS — J9811 Atelectasis: Secondary | ICD-10-CM | POA: Diagnosis not present

## 2022-04-21 DIAGNOSIS — J9601 Acute respiratory failure with hypoxia: Secondary | ICD-10-CM

## 2022-04-21 DIAGNOSIS — E785 Hyperlipidemia, unspecified: Secondary | ICD-10-CM | POA: Diagnosis not present

## 2022-04-21 DIAGNOSIS — Z993 Dependence on wheelchair: Secondary | ICD-10-CM | POA: Diagnosis not present

## 2022-04-21 DIAGNOSIS — K760 Fatty (change of) liver, not elsewhere classified: Secondary | ICD-10-CM | POA: Diagnosis not present

## 2022-04-21 DIAGNOSIS — R000 Tachycardia, unspecified: Secondary | ICD-10-CM | POA: Diagnosis not present

## 2022-04-21 DIAGNOSIS — E538 Deficiency of other specified B group vitamins: Secondary | ICD-10-CM | POA: Diagnosis not present

## 2022-04-21 DIAGNOSIS — Z743 Need for continuous supervision: Secondary | ICD-10-CM | POA: Diagnosis not present

## 2022-04-21 DIAGNOSIS — M5136 Other intervertebral disc degeneration, lumbar region: Secondary | ICD-10-CM | POA: Diagnosis not present

## 2022-04-21 DIAGNOSIS — A419 Sepsis, unspecified organism: Principal | ICD-10-CM

## 2022-04-21 DIAGNOSIS — M4802 Spinal stenosis, cervical region: Secondary | ICD-10-CM | POA: Diagnosis not present

## 2022-04-21 DIAGNOSIS — R404 Transient alteration of awareness: Secondary | ICD-10-CM | POA: Diagnosis not present

## 2022-04-21 DIAGNOSIS — R410 Disorientation, unspecified: Secondary | ICD-10-CM | POA: Diagnosis not present

## 2022-04-21 DIAGNOSIS — I872 Venous insufficiency (chronic) (peripheral): Secondary | ICD-10-CM | POA: Diagnosis not present

## 2022-04-21 DIAGNOSIS — G629 Polyneuropathy, unspecified: Secondary | ICD-10-CM | POA: Diagnosis not present

## 2022-04-21 DIAGNOSIS — Z882 Allergy status to sulfonamides status: Secondary | ICD-10-CM

## 2022-04-21 DIAGNOSIS — Z8541 Personal history of malignant neoplasm of cervix uteri: Secondary | ICD-10-CM

## 2022-04-21 DIAGNOSIS — R6889 Other general symptoms and signs: Secondary | ICD-10-CM | POA: Diagnosis not present

## 2022-04-21 DIAGNOSIS — E871 Hypo-osmolality and hyponatremia: Secondary | ICD-10-CM

## 2022-04-21 DIAGNOSIS — I10 Essential (primary) hypertension: Secondary | ICD-10-CM | POA: Diagnosis not present

## 2022-04-21 DIAGNOSIS — E559 Vitamin D deficiency, unspecified: Secondary | ICD-10-CM | POA: Diagnosis not present

## 2022-04-21 DIAGNOSIS — Z888 Allergy status to other drugs, medicaments and biological substances status: Secondary | ICD-10-CM

## 2022-04-21 DIAGNOSIS — R0602 Shortness of breath: Secondary | ICD-10-CM | POA: Diagnosis not present

## 2022-04-21 DIAGNOSIS — J9600 Acute respiratory failure, unspecified whether with hypoxia or hypercapnia: Secondary | ICD-10-CM | POA: Diagnosis not present

## 2022-04-21 DIAGNOSIS — Z91199 Patient's noncompliance with other medical treatment and regimen due to unspecified reason: Secondary | ICD-10-CM

## 2022-04-21 DIAGNOSIS — Z6841 Body Mass Index (BMI) 40.0 and over, adult: Secondary | ICD-10-CM

## 2022-04-21 DIAGNOSIS — Z79899 Other long term (current) drug therapy: Secondary | ICD-10-CM

## 2022-04-21 DIAGNOSIS — M300 Polyarteritis nodosa: Secondary | ICD-10-CM | POA: Diagnosis not present

## 2022-04-21 DIAGNOSIS — Z9884 Bariatric surgery status: Secondary | ICD-10-CM

## 2022-04-21 DIAGNOSIS — Z833 Family history of diabetes mellitus: Secondary | ICD-10-CM

## 2022-04-21 LAB — I-STAT ARTERIAL BLOOD GAS, ED
Acid-base deficit: 1 mmol/L (ref 0.0–2.0)
Bicarbonate: 25.6 mmol/L (ref 20.0–28.0)
Calcium, Ion: 1.08 mmol/L — ABNORMAL LOW (ref 1.15–1.40)
HCT: 40 % (ref 36.0–46.0)
Hemoglobin: 13.6 g/dL (ref 12.0–15.0)
O2 Saturation: 85 %
Patient temperature: 99
Potassium: 3.9 mmol/L (ref 3.5–5.1)
Sodium: 131 mmol/L — ABNORMAL LOW (ref 135–145)
TCO2: 27 mmol/L (ref 22–32)
pCO2 arterial: 49.3 mmHg — ABNORMAL HIGH (ref 32–48)
pH, Arterial: 7.324 — ABNORMAL LOW (ref 7.35–7.45)
pO2, Arterial: 56 mmHg — ABNORMAL LOW (ref 83–108)

## 2022-04-21 LAB — TROPONIN I (HIGH SENSITIVITY)
Troponin I (High Sensitivity): 7 ng/L (ref <18)
Troponin I (High Sensitivity): 10 ng/L (ref <18)

## 2022-04-21 LAB — CBC
HCT: 40.8 % (ref 36.0–46.0)
Hemoglobin: 12.8 g/dL (ref 12.0–15.0)
MCH: 29.8 pg (ref 26.0–34.0)
MCHC: 31.4 g/dL (ref 30.0–36.0)
MCV: 94.9 fL (ref 80.0–100.0)
Platelets: 177 10*3/uL (ref 150–400)
RBC: 4.3 MIL/uL (ref 3.87–5.11)
RDW: 13.7 % (ref 11.5–15.5)
WBC: 11.3 10*3/uL — ABNORMAL HIGH (ref 4.0–10.5)
nRBC: 0 % (ref 0.0–0.2)

## 2022-04-21 LAB — I-STAT CHEM 8, ED
BUN: 17 mg/dL (ref 6–20)
Calcium, Ion: 1 mmol/L — ABNORMAL LOW (ref 1.15–1.40)
Chloride: 93 mmol/L — ABNORMAL LOW (ref 98–111)
Creatinine, Ser: 1.1 mg/dL — ABNORMAL HIGH (ref 0.44–1.00)
Glucose, Bld: 90 mg/dL (ref 70–99)
HCT: 42 % (ref 36.0–46.0)
Hemoglobin: 14.3 g/dL (ref 12.0–15.0)
Potassium: 3.3 mmol/L — ABNORMAL LOW (ref 3.5–5.1)
Sodium: 133 mmol/L — ABNORMAL LOW (ref 135–145)
TCO2: 25 mmol/L (ref 22–32)

## 2022-04-21 LAB — COMPREHENSIVE METABOLIC PANEL
ALT: 117 U/L — ABNORMAL HIGH (ref 0–44)
AST: 165 U/L — ABNORMAL HIGH (ref 15–41)
Albumin: 2.8 g/dL — ABNORMAL LOW (ref 3.5–5.0)
Alkaline Phosphatase: 196 U/L — ABNORMAL HIGH (ref 38–126)
Anion gap: 13 (ref 5–15)
BUN: 14 mg/dL (ref 6–20)
CO2: 23 mmol/L (ref 22–32)
Calcium: 7.8 mg/dL — ABNORMAL LOW (ref 8.9–10.3)
Chloride: 93 mmol/L — ABNORMAL LOW (ref 98–111)
Creatinine, Ser: 1.03 mg/dL — ABNORMAL HIGH (ref 0.44–1.00)
GFR, Estimated: >60 mL/min (ref >60)
Glucose, Bld: 92 mg/dL (ref 70–99)
Potassium: 3.3 mmol/L — ABNORMAL LOW (ref 3.5–5.1)
Sodium: 129 mmol/L — ABNORMAL LOW (ref 135–145)
Total Bilirubin: 2.1 mg/dL — ABNORMAL HIGH (ref 0.3–1.2)
Total Protein: 6.4 g/dL — ABNORMAL LOW (ref 6.5–8.1)

## 2022-04-21 LAB — I-STAT VENOUS BLOOD GAS, ED
Acid-base deficit: 2 mmol/L (ref 0.0–2.0)
Bicarbonate: 25.9 mmol/L (ref 20.0–28.0)
Calcium, Ion: 1 mmol/L — ABNORMAL LOW (ref 1.15–1.40)
HCT: 42 % (ref 36.0–46.0)
Hemoglobin: 14.3 g/dL (ref 12.0–15.0)
O2 Saturation: 74 %
Potassium: 3.3 mmol/L — ABNORMAL LOW (ref 3.5–5.1)
Sodium: 133 mmol/L — ABNORMAL LOW (ref 135–145)
TCO2: 28 mmol/L (ref 22–32)
pCO2, Ven: 54.7 mmHg (ref 44–60)
pH, Ven: 7.284 (ref 7.25–7.43)
pO2, Ven: 45 mmHg (ref 32–45)

## 2022-04-21 LAB — RESP PANEL BY RT-PCR (FLU A&B, COVID) ARPGX2
Influenza A by PCR: NEGATIVE
Influenza B by PCR: NEGATIVE
SARS Coronavirus 2 by RT PCR: NEGATIVE

## 2022-04-21 LAB — BRAIN NATRIURETIC PEPTIDE: B Natriuretic Peptide: 127.6 pg/mL — ABNORMAL HIGH (ref 0.0–100.0)

## 2022-04-21 MED ORDER — METHYLPREDNISOLONE SODIUM SUCC 125 MG IJ SOLR
125.0000 mg | Freq: Once | INTRAMUSCULAR | Status: AC
  Administered 2022-04-21: 125 mg via INTRAVENOUS
  Filled 2022-04-21: qty 2

## 2022-04-21 MED ORDER — IOHEXOL 350 MG/ML SOLN
75.0000 mL | Freq: Once | INTRAVENOUS | Status: AC | PRN
  Administered 2022-04-21: 75 mL via INTRAVENOUS

## 2022-04-21 MED ORDER — POTASSIUM CHLORIDE 10 MEQ/100ML IV SOLN: 10.0000 meq | INTRAVENOUS | Status: AC

## 2022-04-21 MED ORDER — DIPHENHYDRAMINE HCL 25 MG PO CAPS: 50.0000 mg | ORAL_CAPSULE | Freq: Once | ORAL | Status: DC

## 2022-04-21 MED ORDER — DIPHENHYDRAMINE HCL 50 MG/ML IJ SOLN
50.0000 mg | Freq: Once | INTRAMUSCULAR | Status: AC
  Administered 2022-04-21: 50 mg via INTRAVENOUS
  Filled 2022-04-21: qty 1

## 2022-04-21 NOTE — Subjective & Objective (Signed)
Pt from home found unresponsive and agona breathing Had to bag for 10 min  0.3 epi IM given, solumedrol 125 mg Duo neb and albuterol  Initial BP 105/66 Pt woke up and states they feel fine Recently had chest congestion, was on amoxicillin no fever nonproductive cough Patient has known history of sacral pressure ulcer myelomalacia of cervical cord chronic narcotic use GERD sleep apnea and obesity She has been having some new dysuria for which she was recently prescribed Macrobid

## 2022-04-21 NOTE — ED Provider Notes (Signed)
[facility] EMERGENCY DEPARTMENT Provider Note   CSN: [id number] Arrival date & time: 04/21/22  1813     History  Chief Complaint  Patient presents with   Shortness of Breath    [name] is a 51 y.o. female with past medical history significant for BMI 53.71, bedbound for greater than 1 year, recent diagnosis of UTI presenting with concern for shortness of breath.  Patient son reports that she has been short of breath today and may be some yesterday as well.  They report that patient was sitting up, tried to lay flat and had significant worsening of shortness of breath.  EMS had been called.  Patient's son states that her pulse ox at that time was 78%.  EMS ventilated patient with bag-valve-mask and placed an IO.  On arrival here, patient was alert, oriented, oxygenating well on nasal cannula.  She denies chest pain, increased lower extremity pain.  She states she has no prior history of PE.  She denies cardiac history.  She denies history of asthma, COPD.  She denies fevers.  She states she has been taking the antibiotic she was prescribed for her UTI, she is unsure what it is.  On chart review, it appears patient was prescribed Macrobid. It also appears patient had recently completed course of Augmentin. Shortness of Breath      Home Medications Prior to Admission medications   Medication Sig Start Date End Date Taking? Authorizing Provider  ALPRAZolam Duanne Moron) 0.5 MG tablet TAKE 1 TABLET BY MOUTH 2 TIMES DAILY 01/31/22   [name], MD  ARIPiprazole (ABILIFY) 15 MG tablet TAKE 1 TABLET BY MOUTH EVERY DAY 02/17/22   [name], MD  Armodafinil 250 MG tablet Take 250 mg by mouth in the morning and at bedtime. 11/12/19   [provider]  Biotin 10 MG TABS Take 10 mg by mouth in the morning.  07/25/18   [provider]  buPROPion (WELLBUTRIN XL) 300 MG 24 hr tablet TAKE 1 TABLET BY MOUTH EVERY DAY 04/11/22   [name], MD   calcium citrate (CALCITRATE - DOSED IN MG ELEMENTAL CALCIUM) 950 MG tablet Take 1 tablet by mouth daily.    [provider]  clonazePAM (KLONOPIN) 1 MG tablet TAKE 1 TABLET BY MOUTH AT BEDTIME 03/30/22   [name], MD  cyanocobalamin (VITAMIN B12) 1000 MCG tablet TAKE 1 TABLET BY MOUTH EVERY DAY 03/30/22   [name], MD  cyclobenzaprine (FLEXERIL) 10 MG tablet TAKE 2 TABLETS BY MOUTH 2 TIMES DAILY 04/11/22   [name], MD  diclofenac Sodium (VOLTAREN) 1 % GEL Apply 4 g topically 4 (four) times daily. 02/11/21   [provider]  diphenhydrAMINE (BENADRYL) 25 MG tablet Take 25 mg by mouth 2 (two) times daily as needed for allergies.    [provider]  DULoxetine (CYMBALTA) 60 MG capsule Take 2 capsules (120 mg total) by mouth daily. Patient taking differently: Take 60 mg by mouth 2 (two) times daily. 06/22/15   [name], MD  FEROSUL 325 (65 Fe) MG tablet TAKE 1 TABLET BY MOUTH EVERY DAY WITH BREAKFAST 04/11/22   [name], MD  folic acid (FOLVITE) 1 MG tablet TAKE 1 TABLET BY MOUTH EVERY DAY 04/11/22   [name], MD  gabapentin (NEURONTIN) 600 MG tablet Take 1 tablet (600 mg total) by mouth 3 (three) times daily. TAKE 2 TABLETS BY MOUTH 3 TIMES DAILY Patient taking differently:  Take 600 mg by mouth 3 (three) times daily. 07/09/20   [name], MD  GNP VITAMIN B-1 100 MG tablet TAKE 1 TABLET BY MOUTH EVERY DAY 04/11/22   [name], MD  HYDROcodone-acetaminophen (NORCO) 10-325 MG tablet Take 1 tablet by mouth every 8 (eight) hours as needed for up to 90 doses. 04/13/22   [name], MD  hydrOXYzine (VISTARIL) 25 MG capsule TAKE 1 CAPSULE BY MOUTH EVERY 8 HOURS AS NEEDED 01/07/22   [name], MD  nebivolol (BYSTOLIC) 10 MG tablet TAKE 1 TABLET BY MOUTH EVERY DAY 04/11/22   [name], MD  nitrofurantoin, macrocrystal-monohydrate, (MACROBID) 100 MG capsule Take 1 capsule (100  mg total) by mouth 2 (two) times daily for 7 days. 04/20/22 04/27/22  [name], MD  nystatin (MYCOSTATIN/NYSTOP) powder APPLY 1 APPLICATION TOPICALLY 3 TIMES DAILY 12/20/21   [name], MD  Oxcarbazepine (TRILEPTAL) 300 MG tablet Take 300 mg by mouth 2 (two) times daily.    [provider]  pantoprazole (PROTONIX) 40 MG tablet TAKE 1 TABLET BY MOUTH 2 TIMES DAILY 04/11/22   [name], MD  phentermine 37.5 MG capsule Take 1 capsule (37.5 mg total) by mouth every morning. 10/06/21 03/02/22  [name], MD  promethazine (PHENERGAN) 25 MG tablet TAKE 1 TABLET BY MOUTH EVERY 6 HOURS AS NEEDED FOR NAUSEA AND VOMITING 11/10/21   [name], MD  pyridOXINE (VITAMIN B6) 100 MG tablet TAKE 1 TABLET BY MOUTH EVERY DAY 04/11/22   [name], MD  rosuvastatin (CRESTOR) 20 MG tablet TAKE 1 TABLET BY MOUTH EVERY DAY 09/01/21   [name], MD  SKYRIZI 150 MG/ML SOSY Inject 150 mg into the skin every 3 (three) months. 02/15/21   [provider]      Allergies    Niacin, Sulfamethoxazole-trimethoprim, Aspirin, Bactrim, Benzoin compound, Cephalexin, Iohexol, Lisinopril, Naproxen, Pnu-imune [pneumococcal polysaccharide vaccine], and Sulfonamide derivatives    Review of Systems   Review of Systems  Respiratory:  Positive for shortness of breath.     Physical Exam Updated Vital Signs BP 129/66   Pulse (!) 103   Temp 99 F (37.2 C)   Resp (!) 25   Ht 5' (1.524 m)   Wt 124.7 kg   SpO2 95%   BMI 53.71 kg/m  Physical Exam Constitutional:      Appearance: She is obese. She is ill-appearing and diaphoretic.  HENT:     Head: Normocephalic and atraumatic.     Mouth/Throat:     Mouth: Mucous membranes are moist.     Pharynx: Oropharynx is clear.  Eyes:     Extraocular Movements: Extraocular movements intact.  Cardiovascular:     Rate and Rhythm: Regular rhythm. Tachycardia present.     Heart sounds: Normal heart sounds.   Pulmonary:     Effort: Tachypnea present.     Comments: Breath sounds are difficult to auscultate bilaterally, no obvious wheeze appreciated Abdominal:     Palpations: Abdomen is soft.     Tenderness: There is no abdominal tenderness. There is no rebound.  Musculoskeletal:     Right lower leg: Edema present.     Left lower leg: Edema present.     Comments: Erythema lower extremities bilaterally.  Appears chronic in nature.  Skin:    General: Skin is warm.     Coloration: Skin is pale.  Neurological:     Mental Status: She is alert and oriented to  person, place, and time.     ED Results / Procedures / Treatments   Labs (all labs ordered are listed, but only abnormal results are displayed) Labs Reviewed - No data to display  EKG None  Radiology No results found.  Procedures Procedures    Medications Ordered in ED Medications - No data to display  ED Course/ Medical Decision Making/ A&P                           Medical Decision Making Amount and/or Complexity of Data Reviewed Labs: ordered. Decision-making details documented in ED Course. Radiology: ordered and independent interpretation performed. Decision-making details documented in ED Course. ECG/medicine tests: ordered and independent interpretation performed. Decision-making details documented in ED Course.  Risk Prescription drug management. Decision regarding hospitalization.   Patient presents as above.  Differential includes PE, ACS, pneumothorax, hypervolemia or heart failure.  Due to patient's bedbound status, significant concern for PE.  Patient had reported allergic reaction to contrast.  Patient states she has received contrast since that time and has not had reaction.  On chart review, patient reportedly had hives that resolved with p.o. Benadryl in 2014.  At this time the risks of missing a massive PE in the setting of someone requiring bag-valve-mask ventilations far outweigh the risks of possible  allergic reaction that we are able to manage in the emergency department.  I discussed this with the patient.  I discussed this with radiology.  Patient given IV Solu-Medrol and IV Benadryl, CT PE scan obtained.  CT PE study did not show large central pulmonary embolus, however due to body habitus difficult to evaluate more distally.  Patient does have groundglass opacities bilaterally concerning for edema or infiltrate.  Patient does not have prior diagnosis of heart failure.  BNP ordered.  Patient's EKG showed sinus tachycardia.  No ST elevations, no ST depressions.  Overall did not appear ischemic in nature.  Troponin 7-->10  Patient is slightly hyponatremic at 129, slightly hypokalemic at 3.3, slightly hypochloremic at 93.  Patient's creatinine is slightly elevated at 1.03, BUN within normal limits.  No anion gap.  Patient's LFTs are elevated from a normal baseline.  Very slight leukocytosis at 11.3, platelets and hemoglobin within normal limits.  BNP pending.    On reevaluation, patient's blood pressure remained within normal limits.  She did continue to be slightly tachycardic though her tachypnea did improve.  She was persistently diaphoretic.  ABG ordered.  ABG showed normal pH, pCO2 49, PaO2 low.  Patient's pulse ox read 95%, however PaO2 was in the 50s.  Supplemental O2 administered.  I discussed patient with admitting team.  Patient will be admitted to the hospitalist service.         Final Clinical Impression(s) / ED Diagnoses Final diagnoses:  None    Rx / DC Orders ED Discharge Orders     None         Luster Landsberg, MD 04/22/22 [id number]    Elnora Morrison, MD 04/25/22 [phone]

## 2022-04-21 NOTE — H&P (Signed)
[name] IWL:[id number] DOB: [birth year] DOA: 04/21/2022     PCP: [name], MD      Patient arrived to ER on 04/21/22 at 1813 Referred by Attending Elnora Morrison, MD   Patient coming from:    home Lives  With family    Chief Complaint:   Chief Complaint  Patient presents with   Shortness of Breath    HPI: [name] is a 51 y.o. female with medical history significant of depression, hyperlipidemia, hypertension, compressive cervical myelomalacia, obesity [name], hyperlipidemia skin breakdown    Presented with   found down Pt from home found unresponsive and agona breathing Had to bag for 10 min  0.3 epi IM given, solumedrol 125 mg Duo neb and albuterol  Initial BP 105/66 Pt woke up and states they feel fine Recently had chest congestion, was on amoxicillin no fever nonproductive cough Patient has known history of sacral pressure ulcer myelomalacia of cervical cord chronic narcotic use GERD sleep apnea and obesity She has been having some new dysuria for which she was recently prescribed Macrobid  Reports she took too much allergy medications yesterday and thinks this explains her symptoms  Now feels back to baseline Supposed to be on CPAP   Reports some dysuria still  Initial COVID TEST  NEGATIVE   Lab Results  Component Value Date   [place] NEGATIVE 04/21/2022   [place] NEGATIVE 08/12/2021   [place] NEGATIVE 06/08/2021   [place] NEGATIVE 06/04/2021     Regarding pertinent Chronic problems:     Hyperlipidemia -  on statins Crestor Lipid Panel     Component Value Date/Time   CHOL 107 09/04/2020 0838   TRIG 58.0 09/04/2020 0838   HDL 46.10 09/04/2020 0838   CHOLHDL 2 09/04/2020 0838   VLDL 11.6 09/04/2020 0838   LDLCALC 49 09/04/2020 0838   LDLDIRECT 76.0 11/24/2015 1148     HTN on Bystolic       Morbid obesity-   BMI Readings from Last 1 Encounters:  04/21/22 53.71 kg/m      [name] - noncompliant with CPAP    While  in ER:   hyponatremic at 129, slightly hypokalemic at 3.3, slightly hypochloremic at 93    CXR - atelectasis.     CTA chest -   no PE,  evidence of infiltrate  Following Medications were ordered in ER: Medications  diphenhydrAMINE (BENADRYL) injection 50 mg (50 mg Intravenous Given 04/21/22 2026)  methylPREDNISolone sodium succinate (SOLU-MEDROL) 125 mg/2 mL injection 125 mg (125 mg Intravenous Given 04/21/22 1958)  iohexol (OMNIPAQUE) 350 MG/ML injection 75 mL (75 mLs Intravenous Contrast Given 04/21/22 2102)       ED Triage Vitals  Enc Vitals Group     BP 04/21/22 1824 98/74     Pulse Rate 04/21/22 1824 (!) 104     Resp 04/21/22 1826 18     Temp 04/21/22 1828 99 F (37.2 C)     Temp src --      SpO2 04/21/22 1824 94 %     Weight 04/21/22 1828 275 lb (124.7 kg)     Height 04/21/22 1828 5' (1.524 m)     Head Circumference --      Peak Flow --      Pain Score 04/21/22 1828 0     Pain Loc --      Pain Edu? --      Excl. in [place]? --   TMAX(24)@     _________________________________________ Significant  initial  Findings: Abnormal Labs Reviewed  COMPREHENSIVE METABOLIC PANEL - Abnormal; Notable for the following components:      Result Value   Sodium 129 (*)    Potassium 3.3 (*)    Chloride 93 (*)    Creatinine, Ser 1.03 (*)    Calcium 7.8 (*)    Total Protein 6.4 (*)    Albumin 2.8 (*)    AST 165 (*)    ALT 117 (*)    Alkaline Phosphatase 196 (*)    Total Bilirubin 2.1 (*)    All other components within normal limits  CBC - Abnormal; Notable for the following components:   WBC 11.3 (*)    All other components within normal limits  I-STAT CHEM 8, ED - Abnormal; Notable for the following components:   Sodium 133 (*)    Potassium 3.3 (*)    Chloride 93 (*)    Creatinine, Ser 1.10 (*)    Calcium, Ion 1.00 (*)    All other components within normal limits  I-STAT VENOUS BLOOD GAS, ED - Abnormal; Notable for the following components:   Sodium 133 (*)    Potassium 3.3 (*)     Calcium, Ion 1.00 (*)    All other components within normal limits   __________________ Troponin 7 -10 ECG: Ordered Personally reviewed and interpreted by me showing: HR : 104 Rhythm:  Sinus tachycardia    no evidence of ischemic changes QTC 463   __________ This patient meets SIRS Criteria and may be septic.    The recent clinical data is shown below. Vitals:   04/21/22 1826 04/21/22 1828 04/21/22 1829 04/21/22 1830  BP:    129/66  Pulse: (!) 103  (!) 103 (!) 103  Resp: 18  (!) 25 (!) 25  Temp:  99 F (37.2 C)    SpO2: 93%  95% 95%  Weight:  124.7 kg    Height:  5' (1.524 m)      WBC     Component Value Date/Time   WBC 11.3 (H) 04/21/2022 1846   LYMPHSABS 1.3 01/12/2022 1440   LYMPHSABS 2.8 11/03/2011 1036   MONOABS 0.5 01/12/2022 1440   EOSABS 0.5 01/12/2022 1440   EOSABS 0.4 11/03/2011 1036   BASOSABS 0.1 01/12/2022 1440   BASOSABS 0.0 11/03/2011 1036     Lactic Acid, Venous    Component Value Date/Time   LATICACIDVEN 1.1 08/12/2021 1220    Procalcitonin   Ordered Lactic Acid, Venous    Component Value Date/Time   LATICACIDVEN 1.1 08/12/2021 1220    Procalcitonin   Ordered   UA  ordered    Results for orders placed or performed during the hospital encounter of 04/21/22  Resp Panel by RT-PCR (Flu A&B, Covid) Anterior Nasal Swab     Status: None   Collection Time: 04/21/22  6:46 PM   Specimen: Anterior Nasal Swab  Result Value Ref Range Status   SARS Coronavirus 2 by RT PCR NEGATIVE NEGATIVE Final         Influenza A by PCR NEGATIVE NEGATIVE Final   Influenza B by PCR NEGATIVE NEGATIVE Final         *Note: Due to a large number of results and/or encounters for the requested time period, some results have not been displayed. A complete set of results can be found in Results Review.    _______________________________________________ Hospitalist was called for admission for   Hypoxia    The following Work up has been ordered so  far:  Orders  Placed This Encounter  Procedures   Resp Panel by RT-PCR (Flu A&B, Covid) Anterior Nasal Swab   DG Chest Port 1 View   CT ANGIO CHEST PE W OR WO CONTRAST   Comprehensive metabolic panel   CBC   Brain natriuretic peptide   Cardiac monitoring   Initiate Carrier Fluid Protocol   Consult to hospitalist   Pulse oximetry, continuous   I-Stat Chem 8, ED   I-Stat venous blood gas, ED   EKG 12-Lead   Saline lock IV     OTHER Significant initial  Findings:  labs showing:    Recent Labs  Lab 04/21/22 1846 04/21/22 1857 04/21/22 1859 04/21/22 2353  NA 129* 133* 133* 131*  K 3.3* 3.3* 3.3* 3.9  CO2 23  --   --   --   GLUCOSE 92 90  --   --   BUN 14 17  --   --   CREATININE 1.03* 1.10*  --   --   CALCIUM 7.8*  --   --   --     Cr  * stable,  Up from baseline see below Lab Results  Component Value Date   CREATININE 1.10 (H) 04/21/2022   CREATININE 1.03 (H) 04/21/2022   CREATININE 0.55 01/12/2022    Recent Labs  Lab 04/21/22 1846  AST 165*  ALT 117*  ALKPHOS 196*  BILITOT 2.1*  PROT 6.4*  ALBUMIN 2.8*   Lab Results  Component Value Date   CALCIUM 7.8 (L) 04/21/2022   PHOS 4.6 01/14/2016          Plt: Lab Results  Component Value Date   PLT 177 04/21/2022       COVID-19 Labs  No results for input(s): "DDIMER", "FERRITIN", "LDH", "CRP" in the last 72 hours.  Lab Results  Component Value Date   SARSCOV2NAA NEGATIVE 04/21/2022   SARSCOV2NAA NEGATIVE 08/12/2021   SARSCOV2NAA NEGATIVE 06/08/2021   SARSCOV2NAA NEGATIVE 06/04/2021       ABG    Component Value Date/Time   PHART 7.324 (L) 04/21/2022 2353   PCO2ART 49.3 (H) 04/21/2022 2353   PO2ART 56 (L) 04/21/2022 2353   HCO3 25.6 04/21/2022 2353   TCO2 27 04/21/2022 2353   ACIDBASEDEF 1.0 04/21/2022 2353   O2SAT 85 04/21/2022 2353    Recent Labs  Lab 04/21/22 1846 04/21/22 1857 04/21/22 1859  WBC 11.3*  --   --   HGB 12.8 14.3 14.3  HCT 40.8 42.0 42.0  MCV 94.9  --   --   PLT 177  --    --     HG/HCT   stable,     Component Value Date/Time   HGB 14.3 04/21/2022 1859   HGB 13.9 11/03/2011 1036   HCT 42.0 04/21/2022 1859   HCT 42.8 11/03/2011 1036   MCV 94.9 04/21/2022 1846   MCV 89 11/03/2011 1036    BNP (last 3 results) Recent Labs    08/12/21 1220  BNP 106.2*      DM  labs:  HbA1C: Recent Labs    06/04/21 1739  HGBA1C 5.1      CBG (last 3)  No results for input(s): "GLUCAP" in the last 72 hours.    Cultures:    Component Value Date/Time   SDES  12/28/2021 1700    WOUND Performed at [facility], [address] [place], [place] [address]    [facility]  12/28/2021 1700    RIGHT POSTERIOR LEG Performed at  [facility], [address], [place], [place] [address]    CULT  12/28/2021 1700    FEW STAPHYLOCOCCUS AUREUS FEW ENTEROCOCCUS FAECALIS RARE PSEUDOMONAS AERUGINOSA    REPTSTATUS 01/02/2022 FINAL 12/28/2021 1700     Radiological Exams on Admission: CT ANGIO CHEST PE W OR WO CONTRAST  Result Date: 04/21/2022 CLINICAL DATA:  Pulmonary embolism suspected, high probability. Shortness of breath. EXAM: CT ANGIOGRAPHY CHEST WITH CONTRAST TECHNIQUE: Multidetector CT imaging of the chest was performed using the standard protocol during bolus administration of intravenous contrast. Multiplanar CT image reconstructions and MIPs were obtained to evaluate the vascular anatomy. RADIATION DOSE REDUCTION: This exam was performed according to the departmental dose-optimization program which includes automated exposure control, adjustment of the mA and/or kV according to patient size and/or use of iterative reconstruction technique. CONTRAST:  11m OMNIPAQUE IOHEXOL 350 MG/ML SOLN COMPARISON:  08/12/2021. FINDINGS: Cardiovascular: Heart is enlarged and there is no pericardial effusion. The aorta and pulmonary trunk are normal in caliber. No large central pulmonary embolism is identified. Evaluation of the pulmonary arteries  is limited due to suboptimal opacification, patient's body habitus, and respiratory motion artifact. Mediastinum/Nodes: No enlarged mediastinal, hilar, or axillary lymph nodes. Thyroid gland, trachea, and esophagus demonstrate no significant findings. Lungs/Pleura: Hazy ground-glass opacities are present in the lungs bilaterally. Atelectasis is present at the lung bases. No effusion or pneumothorax. Upper Abdomen: Gastric surgery changes are noted. No acute abnormality. Musculoskeletal: Degenerative changes in the thoracic spine. Stable compression deformities are present at T6 and T12. No acute osseous abnormality. Review of the MIP images confirms the above findings. IMPRESSION: 1. No large central pulmonary embolus. Evaluation of the distal pulmonary arteries is limited due to patient's body habitus, suboptimal opacification, and respiratory motion artifact. 2. Hazy ground-glass opacities in the lungs bilaterally, possible edema or infiltrate. 3. Cardiomegaly. Electronically Signed   By: LBrett FairyM.D.   On: 04/21/2022 21:18   DG Chest Port 1 View  Result Date: 04/21/2022 CLINICAL DATA:  Shortness of breath EXAM: PORTABLE CHEST 1 VIEW COMPARISON:  08/12/2021 FINDINGS: Low lung volumes. Cardiomegaly with vascular congestion. Right base opacity, favor atelectasis. No effusions or pneumothorax. No acute bony abnormality. IMPRESSION: Low lung volumes with right base atelectasis. Cardiomegaly, vascular congestion. Electronically Signed   By: KRolm BaptiseM.D.   On: 04/21/2022 19:36   _______________________________________________________________________________________________________ Latest  Blood pressure 129/66, pulse (!) 103, temperature 99 F (37.2 C), resp. rate (!) 25, height 5' (1.524 m), weight 124.7 kg, SpO2 95 %.   Vitals  labs and radiology finding personally reviewed  Review of Systems:    Pertinent positives include:   Fatigue dysuria, Constitutional:  No weight loss, night sweats,  Fevers, chills, , weight loss  HEENT:  No headaches, Difficulty swallowing,Tooth/dental problems,Sore throat,  No sneezing, itching, ear ache, nasal congestion, post nasal drip,  Cardio-vascular:  No chest pain, Orthopnea, PND, anasarca, dizziness, palpitations.no Bilateral lower extremity swelling  GI:  No heartburn, indigestion, abdominal pain, nausea, vomiting, diarrhea, change in bowel habits, loss of appetite, melena, blood in stool, hematemesis Resp:  no shortness of breath at rest. No dyspnea on exertion, No excess mucus, no productive cough, No non-productive cough, No coughing up of blood.No change in color of mucus.No wheezing. Skin:  no rash or lesions. No jaundice GU:  no  change in color of urine, no urgency or frequency. No straining to urinate.  No flank pain.  Musculoskeletal:  No joint pain or no joint swelling. No decreased range of  motion. No back pain.  Psych:  No change in mood or affect. No depression or anxiety. No memory loss.  Neuro: no localizing neurological complaints, no tingling, no weakness, no double vision, no gait abnormality, no slurred speech, no confusion  All systems reviewed and apart from [employer] all are negative _______________________________________________________________________________________________ Past Medical History:   Past Medical History:  Diagnosis Date   Allergic rhinitis    Ankle fracture, right    Anxiety    ASCUS of cervix with negative high risk HPV 05/2018   Carpal tunnel syndrome    Cervical cancer ([place]) 1998   Fibromyalgia    Hyperlipidemia    Hypertension    Migraine headache    Obesity    Sleep apnea      Past Surgical History:  Procedure Laterality Date   ABDOMINAL HYSTERECTOMY  10/1996   ABDOMINAL SURGERY  jan/11/2012   gastric bypass surgery   CARPAL TUNNEL RELEASE     bilateral    CESAREAN SECTION     CHOLECYSTECTOMY  07/1992   ESOPHAGOGASTRODUODENOSCOPY N/A 01/14/2016   Procedure:  ESOPHAGOGASTRODUODENOSCOPY (EGD);  Surgeon: Mauri Pole, MD;  [facility];  Service: Endoscopy;  Laterality: N/A;    Social History:    bed bound      reports that she has never smoked. She has never used smokeless tobacco. She reports that she does not drink alcohol and does not use drugs.     Family History:   Family History  Problem Relation Age of Onset   Diabetes Mother    Emphysema Mother    Allergies Mother    Hypertension Mother    Heart disease Father    Allergies Father    Prostate cancer Father    Breast cancer Maternal Grandmother 65   ______________________________________________________________________________________________ Allergies: Allergies  Allergen Reactions   Niacin Anaphylaxis, Shortness Of Breath and Swelling    Swelling and "problems breathing"   Sulfamethoxazole-Trimethoprim Anaphylaxis, Swelling, Rash and Other (See Comments)    Throat closed and the eyes became swollen   Aspirin Hives   Bactrim Hives   Benzoin Compound Rash   Cephalexin Hives    Tolerated ceftriaxone and cefepime 07/2019   Iohexol Hives    Pt treated with PO benedryl   Lisinopril Hives   Naproxen Hives   Pnu-Imune [Pneumococcal Polysaccharide Vaccine] Rash   Sulfonamide Derivatives Rash     Prior to Admission medications   Medication Sig Start Date End Date Taking? Authorizing Provider  ALPRAZolam (XANAX) 0.5 MG tablet TAKE 1 TABLET BY MOUTH 2 TIMES DAILY Patient taking differently: Take 0.5 mg by mouth 2 (two) times daily as needed for anxiety. 01/31/22  Yes [name], MD  ARIPiprazole (ABILIFY) 15 MG tablet TAKE 1 TABLET BY MOUTH EVERY DAY 02/17/22  Yes [name], MD  Armodafinil 250 MG tablet Take 250 mg by mouth in the morning and at bedtime. 11/12/19  Yes [provider]  Biotin 10 MG TABS Take 10 mg by mouth in the morning.  07/25/18  Yes [provider]  buPROPion (WELLBUTRIN XL) 300 MG 24 hr tablet TAKE 1  TABLET BY MOUTH EVERY DAY Patient taking differently: Take 300 mg by mouth daily. 04/11/22  Yes [name], MD  calcium citrate (CALCITRATE - DOSED IN MG ELEMENTAL CALCIUM) 950 MG tablet Take 1 tablet by mouth daily.   Yes [provider]  clonazePAM (KLONOPIN) 1 MG tablet TAKE 1 TABLET BY MOUTH AT BEDTIME 03/30/22  Yes [name]  E, MD  cyanocobalamin (VITAMIN B12) 1000 MCG tablet TAKE 1 TABLET BY MOUTH EVERY DAY 03/30/22  Yes [name], MD  cyclobenzaprine (FLEXERIL) 10 MG tablet TAKE 2 TABLETS BY MOUTH 2 TIMES DAILY 04/11/22  Yes [name], MD  diclofenac Sodium (VOLTAREN) 1 % GEL Apply 4 g topically 4 (four) times daily. 02/11/21  Yes [provider]  diphenhydrAMINE (BENADRYL) 25 MG tablet Take 25 mg by mouth 2 (two) times daily as needed for allergies.   Yes [provider]  DULoxetine (CYMBALTA) 60 MG capsule Take 2 capsules (120 mg total) by mouth daily. Patient taking differently: Take 60 mg by mouth 2 (two) times daily. 06/22/15  Yes [name], MD  FEROSUL 325 (65 Fe) MG tablet TAKE 1 TABLET BY MOUTH EVERY DAY WITH BREAKFAST Patient taking differently: Take 325 mg by mouth daily. 04/11/22  Yes [name], MD  folic acid (FOLVITE) 1 MG tablet TAKE 1 TABLET BY MOUTH EVERY DAY Patient taking differently: Take 1 mg by mouth daily. 04/11/22  Yes [name], MD  gabapentin (NEURONTIN) 600 MG tablet Take 1 tablet (600 mg total) by mouth 3 (three) times daily. TAKE 2 TABLETS BY MOUTH 3 TIMES DAILY Patient taking differently: Take 600 mg by mouth 3 (three) times daily. 07/09/20  Yes [name], MD  GNP VITAMIN B-1 100 MG tablet TAKE 1 TABLET BY MOUTH EVERY DAY 04/11/22  Yes [name], MD  HYDROcodone-acetaminophen (NORCO) 10-325 MG tablet Take 1 tablet by mouth every 8 (eight) hours as needed for up to 90 doses. 04/13/22  Yes [name], MD  nebivolol (BYSTOLIC) 10 MG tablet TAKE 1  TABLET BY MOUTH EVERY DAY Patient taking differently: Take 10 mg by mouth daily. 04/11/22  Yes [name], MD  nitrofurantoin, macrocrystal-monohydrate, (MACROBID) 100 MG capsule Take 1 capsule (100 mg total) by mouth 2 (two) times daily for 7 days. 04/20/22 04/27/22 Yes [name], MD  nystatin (MYCOSTATIN/NYSTOP) powder APPLY 1 APPLICATION TOPICALLY 3 TIMES DAILY 12/20/21  Yes [name], MD  Oxcarbazepine (TRILEPTAL) 300 MG tablet Take 300 mg by mouth 2 (two) times daily.   Yes [provider]  pantoprazole (PROTONIX) 40 MG tablet TAKE 1 TABLET BY MOUTH 2 TIMES DAILY 04/11/22  Yes [name], MD  promethazine (PHENERGAN) 25 MG tablet TAKE 1 TABLET BY MOUTH EVERY 6 HOURS AS NEEDED FOR NAUSEA AND VOMITING Patient taking differently: Take 25 mg by mouth every 6 (six) hours as needed for vomiting or nausea. 11/10/21  Yes [name], MD  pyridOXINE (VITAMIN B6) 100 MG tablet TAKE 1 TABLET BY MOUTH EVERY DAY Patient taking differently: Take 100 mg by mouth daily. 04/11/22  Yes [name], MD  rosuvastatin (CRESTOR) 20 MG tablet TAKE 1 TABLET BY MOUTH EVERY DAY Patient taking differently: Take 20 mg by mouth daily. 09/01/21  Yes [name], MD  SKYRIZI 150 MG/ML SOSY Inject 150 mg into the skin every 3 (three) months. 02/15/21  Yes [provider]  hydrOXYzine (VISTARIL) 25 MG capsule TAKE 1 CAPSULE BY MOUTH EVERY 8 HOURS AS NEEDED Patient not taking: Reported on 04/21/2022 01/07/22   [name], MD  phentermine 37.5 MG capsule Take 1 capsule (37.5 mg total) by mouth every morning. Patient not taking: Reported on 04/21/2022 10/06/21 03/02/22  [name], MD    ___________________________________________________________________________________________________ Physical Exam:    04/21/2022    6:30 PM 04/21/2022  6:29 PM 04/21/2022    6:28 PM  Vitals with BMI  Height   5' 0"  Weight   275 lbs  BMI   39.76   Systolic 734    Diastolic 66    Pulse 193 790      1. General:  in No  Acute distress   Chronically ill   -appearing 2. Psychological: Alert and   Oriented 3. Head/ENT: Dry Mucous Membranes                          Head Non traumatic, neck supple                           Poor Dentition 4. SKIN: n decreased Skin turgor,  Skin clean Dry and intact no rash 5. Heart: Regular rate and rhythm no  Murmur, no Rub or gallop 6. Lungs:   no wheezes or crackles   7. Abdomen: Soft,  non-tender, Non distended   obese  bowel sounds present 8. Lower extremities: no clubbing, cyanosis,  trace edema, wound on right leg chronic 9. Neurologically Grossly intact, moving all 4 extremities equally generalized debility 10. MSK: Normal range of motion    Chart has been reviewed  ______________________________________________________________________________________________  Assessment/Plan 51 y.o. female with medical history significant of depression, hyperlipidemia, hypertension, compressive cervical myelomalacia, obesity [name], hyperlipidemia skin breakdown    Admitted for   acute respiratory failure with hypoxia     Present on Admission:  Acute respiratory failure (HCC)  Compressive cervical cord myelomalacia (HCC)  Hyperlipidemia  Obesity, Class III, BMI 40-49.9 (morbid obesity) ([place])  Essential hypertension  Anxiety and depression  Hyponatremia  CAP (community acquired pneumonia)  Hypokalemia  Sepsis ([place])  [name] (obstructive sleep apnea)     Compressive cervical cord myelomalacia (HCC) Chronic patient with chronic diminished mobility  Acute respiratory failure ([place]) Acute respiratory failure hypoxia etiology unclear Possible atypical pneumonia although CHF also on differential.  No prior history of seizure. Obtain procalcitonin Continue oxygen as needed Noted to have O2 sat sats below 90 on room air  Hyperlipidemia Resume Crestor 20 mg p.o. daily  Obesity, Class III, BMI 40-49.9  (morbid obesity) ([place]) Chronic follow-up as an outpatient Could be contributing to hypoxia  Essential hypertension Allow permissive hypertension for tonight  Anxiety and depression Hold off on sedation medication  Hyponatremia Chronic follow and obtain your electrolytes  CAP (community acquired pneumonia) CT scan concerning for possible underlying pneumonia given the slightly elevated white blood cell count and cough Patient is allergic to cephalosporins started on Levaquin Check MRSA Respiratory panel COVID-negative obtain sputum cultures check for Legionella and strep pneumo  Hypokalemia Will replace check magnesium  Sepsis ([place])  -SIRS criteria met with  elevated white blood cell count,       Component Value Date/Time   WBC 11.3 (H) 04/21/2022 1846   LYMPHSABS 1.3 01/12/2022 1440   LYMPHSABS 2.8 11/03/2011 1036     tachycardia     RR >20 Today's Vitals   04/21/22 1826 04/21/22 1828 04/21/22 1829 04/21/22 1830  BP:    129/66  Pulse: (!) 103  (!) 103 (!) 103  Resp: 18  (!) 25 (!) 25  Temp:  99 F (37.2 C)    SpO2: 93%  95% 95%  Weight:  124.7 kg    Height:  5' (1.524 m)    PainSc:  0-No pain     Body mass  index is 53.71 kg/m.    Vitals:   04/21/22 1826 04/21/22 1828 04/21/22 1829 04/21/22 1830  BP:    129/66  Pulse: (!) 103  (!) 103 (!) 103  Resp: 18  (!) 25 (!) 25  Temp:  99 F (37.2 C)    SpO2: 93%  95% 95%  Weight:  124.7 kg    Height:  5' (1.524 m)          -Most likely source being:  urinary, pulmonary, intra-abdominal,  Cellulitis, soft tissue infection, viral,   Patient meeting criteria for Severe sepsis with    evidence of end organ damage/organ dysfunction such as       acute metabolic encephalopathy   Acute hypoxia requiring new supplemental oxygen, SpO2: 95 %       Component Value Date/Time   PLT 177 04/21/2022 1846   PLT 271 11/03/2011 1036       - Obtain serial lactic acid and procalcitonin level.  - Initiated IV antibiotics  in ER: Antibiotics Given (last 72 hours)     None       Will continue  on : Levaquin   - await results of blood and urine culture    1:10 AM   [name] (obstructive sleep apnea) Resume CPAP    Other plan as per orders.  DVT prophylaxis:  SCD    Code Status:    Code Status: Prior FULL CODE   as per patient   I had personally discussed CODE STATUS with patient      Family Communication:   Family not at  Bedside    Disposition Plan:      To home once workup is complete and patient is stable   Following barriers for discharge:                            Electrolytes corrected                                                         Will likely need home health, home O2, set up                       Would benefit from PT/OT eval prior to DC  Ordered                                    Transition of care consulted                   Nutrition    consulted                                      Consults called: none   Admission status:  ED Disposition     ED Disposition  Admit   Condition  --   [place]: [place] [[id number]]  Level of Care: Progressive [102]  Admit to Progressive based on following criteria: RESPIRATORY PROBLEMS hypoxemic/hypercapnic respiratory failure that is responsive to NIPPV (BiPAP) or High Flow Nasal Cannula (6-80 lpm). Frequent assessment/intervention, no > Q2 hrs < Q4  hrs, to maintain oxygenation and pulmonary hygiene.  May admit patient to [facility] or [facility] if equivalent level of care is available:: No  Covid Evaluation: Confirmed COVID Negative  Diagnosis: Acute respiratory failure ([place]) [518.81.ICD-9-CM]  Admitting Physician: Toy Baker [[id number]]  Attending Physician: Toy Baker [[id number]]  Certification:: I certify this patient will need inpatient services for at least 2 midnights           inpatient     I Expect 2 midnight stay secondary to severity of patient's current illness need for  inpatient interventions justified by the following:  hemodynamic instability despite optimal treatment (  hypoxia, )  Severe lab/radiological/exam abnormalities including:    hypoxia and extensive comorbidities including:  Morbid Obesity    That are currently affecting medical management.   I expect  patient to be hospitalized for 2 midnights requiring inpatient medical care.  Patient is at high risk for adverse outcome (such as loss of life or disability) if not treated.  Indication for inpatient stay as follows:  Severe change from baseline regarding mental status   New or worsening hypoxia   Need for IV antibiotics,      Level of care       progressive tele indefinitely please discontinue once patient no longer qualifies COVID-19 Labs    Lab Results  Component Value Date   [place] 04/21/2022     Precautions: admitted as   Covid Negative        04/22/2022, 1:12 AM    [facility]     after 2 AM please page floor coverage PA If 7AM-7PM, please contact the day team taking care of the patient using [email or web address]   Patient was evaluated in the context of the global COVID-19 pandemic, which necessitated consideration that the patient might be at risk for infection with the SARS-CoV-2 virus that causes COVID-19. Institutional protocols and algorithms that pertain to the evaluation of patients at risk for COVID-19 are in a state of rapid change based on information released by regulatory bodies including the CDC and federal and state organizations. These policies and algorithms were followed during the patient's care.

## 2022-04-21 NOTE — ED Triage Notes (Addendum)
Pt bib ems from home; hx copd; not feeling well today; son came to find pt unresponsive, agonal; bagged for 10 min; king airway briefly in place; 0.3 IM epi given pta; 125 solumedrol, 1 duo neb, 5 albuterol  0.5 Atrovent; lung sound diminished initially throughout; 20 rac, IO in L leg; 105/66, hr 100 sinus, 100% RA, RR 24; pt a and o x 4, states "I feel fine!"; pt is currently being treated for UTI

## 2022-04-22 ENCOUNTER — Inpatient Hospital Stay (HOSPITAL_COMMUNITY): Payer: Medicare Other

## 2022-04-22 DIAGNOSIS — E876 Hypokalemia: Secondary | ICD-10-CM | POA: Diagnosis present

## 2022-04-22 DIAGNOSIS — I872 Venous insufficiency (chronic) (peripheral): Secondary | ICD-10-CM | POA: Diagnosis not present

## 2022-04-22 DIAGNOSIS — Z9884 Bariatric surgery status: Secondary | ICD-10-CM | POA: Diagnosis not present

## 2022-04-22 DIAGNOSIS — J9601 Acute respiratory failure with hypoxia: Secondary | ICD-10-CM | POA: Diagnosis present

## 2022-04-22 DIAGNOSIS — M300 Polyarteritis nodosa: Secondary | ICD-10-CM | POA: Diagnosis not present

## 2022-04-22 DIAGNOSIS — G9341 Metabolic encephalopathy: Secondary | ICD-10-CM | POA: Diagnosis present

## 2022-04-22 DIAGNOSIS — Z881 Allergy status to other antibiotic agents status: Secondary | ICD-10-CM | POA: Diagnosis not present

## 2022-04-22 DIAGNOSIS — Z8249 Family history of ischemic heart disease and other diseases of the circulatory system: Secondary | ICD-10-CM | POA: Diagnosis not present

## 2022-04-22 DIAGNOSIS — E559 Vitamin D deficiency, unspecified: Secondary | ICD-10-CM

## 2022-04-22 DIAGNOSIS — F32A Depression, unspecified: Secondary | ICD-10-CM

## 2022-04-22 DIAGNOSIS — G4733 Obstructive sleep apnea (adult) (pediatric): Secondary | ICD-10-CM | POA: Diagnosis not present

## 2022-04-22 DIAGNOSIS — K219 Gastro-esophageal reflux disease without esophagitis: Secondary | ICD-10-CM

## 2022-04-22 DIAGNOSIS — I10 Essential (primary) hypertension: Secondary | ICD-10-CM | POA: Diagnosis not present

## 2022-04-22 DIAGNOSIS — M4802 Spinal stenosis, cervical region: Secondary | ICD-10-CM | POA: Diagnosis not present

## 2022-04-22 DIAGNOSIS — J189 Pneumonia, unspecified organism: Secondary | ICD-10-CM | POA: Diagnosis present

## 2022-04-22 DIAGNOSIS — Z6841 Body Mass Index (BMI) 40.0 and over, adult: Secondary | ICD-10-CM | POA: Diagnosis not present

## 2022-04-22 DIAGNOSIS — J9811 Atelectasis: Secondary | ICD-10-CM | POA: Diagnosis present

## 2022-04-22 DIAGNOSIS — G43909 Migraine, unspecified, not intractable, without status migrainosus: Secondary | ICD-10-CM

## 2022-04-22 DIAGNOSIS — G629 Polyneuropathy, unspecified: Secondary | ICD-10-CM | POA: Diagnosis not present

## 2022-04-22 DIAGNOSIS — Z7401 Bed confinement status: Secondary | ICD-10-CM | POA: Diagnosis not present

## 2022-04-22 DIAGNOSIS — A419 Sepsis, unspecified organism: Secondary | ICD-10-CM | POA: Diagnosis not present

## 2022-04-22 DIAGNOSIS — J9611 Chronic respiratory failure with hypoxia: Secondary | ICD-10-CM | POA: Diagnosis present

## 2022-04-22 DIAGNOSIS — F419 Anxiety disorder, unspecified: Secondary | ICD-10-CM

## 2022-04-22 DIAGNOSIS — G9589 Other specified diseases of spinal cord: Secondary | ICD-10-CM | POA: Diagnosis not present

## 2022-04-22 DIAGNOSIS — Z79899 Other long term (current) drug therapy: Secondary | ICD-10-CM

## 2022-04-22 DIAGNOSIS — E871 Hypo-osmolality and hyponatremia: Secondary | ICD-10-CM | POA: Diagnosis present

## 2022-04-22 DIAGNOSIS — G9529 Other cord compression: Secondary | ICD-10-CM | POA: Diagnosis not present

## 2022-04-22 DIAGNOSIS — Z79891 Long term (current) use of opiate analgesic: Secondary | ICD-10-CM

## 2022-04-22 DIAGNOSIS — M5136 Other intervertebral disc degeneration, lumbar region: Secondary | ICD-10-CM | POA: Diagnosis not present

## 2022-04-22 DIAGNOSIS — Z8541 Personal history of malignant neoplasm of cervix uteri: Secondary | ICD-10-CM | POA: Diagnosis not present

## 2022-04-22 DIAGNOSIS — Z743 Need for continuous supervision: Secondary | ICD-10-CM | POA: Diagnosis not present

## 2022-04-22 DIAGNOSIS — Z1152 Encounter for screening for COVID-19: Secondary | ICD-10-CM | POA: Diagnosis not present

## 2022-04-22 DIAGNOSIS — E538 Deficiency of other specified B group vitamins: Secondary | ICD-10-CM | POA: Diagnosis not present

## 2022-04-22 DIAGNOSIS — Z9181 History of falling: Secondary | ICD-10-CM

## 2022-04-22 DIAGNOSIS — L409 Psoriasis, unspecified: Secondary | ICD-10-CM | POA: Diagnosis not present

## 2022-04-22 DIAGNOSIS — Z993 Dependence on wheelchair: Secondary | ICD-10-CM

## 2022-04-22 DIAGNOSIS — K760 Fatty (change of) liver, not elsewhere classified: Secondary | ICD-10-CM

## 2022-04-22 DIAGNOSIS — Z87440 Personal history of urinary (tract) infections: Secondary | ICD-10-CM

## 2022-04-22 DIAGNOSIS — E785 Hyperlipidemia, unspecified: Secondary | ICD-10-CM | POA: Diagnosis not present

## 2022-04-22 DIAGNOSIS — J9600 Acute respiratory failure, unspecified whether with hypoxia or hypercapnia: Secondary | ICD-10-CM | POA: Diagnosis present

## 2022-04-22 DIAGNOSIS — M797 Fibromyalgia: Secondary | ICD-10-CM | POA: Diagnosis not present

## 2022-04-22 LAB — COMPREHENSIVE METABOLIC PANEL
ALT: 106 U/L — ABNORMAL HIGH (ref 0–44)
AST: 126 U/L — ABNORMAL HIGH (ref 15–41)
Albumin: 3.1 g/dL — ABNORMAL LOW (ref 3.5–5.0)
Alkaline Phosphatase: 187 U/L — ABNORMAL HIGH (ref 38–126)
Anion gap: 14 (ref 5–15)
BUN: 17 mg/dL (ref 6–20)
CO2: 21 mmol/L — ABNORMAL LOW (ref 22–32)
Calcium: 8 mg/dL — ABNORMAL LOW (ref 8.9–10.3)
Chloride: 95 mmol/L — ABNORMAL LOW (ref 98–111)
Creatinine, Ser: 0.88 mg/dL (ref 0.44–1.00)
GFR, Estimated: >60 mL/min (ref >60)
Glucose, Bld: 134 mg/dL — ABNORMAL HIGH (ref 70–99)
Potassium: 4.3 mmol/L (ref 3.5–5.1)
Sodium: 130 mmol/L — ABNORMAL LOW (ref 135–145)
Total Bilirubin: 1.6 mg/dL — ABNORMAL HIGH (ref 0.3–1.2)
Total Protein: 6.6 g/dL (ref 6.5–8.1)

## 2022-04-22 LAB — URINALYSIS, ROUTINE W REFLEX MICROSCOPIC
Bilirubin Urine: NEGATIVE
Glucose, UA: NEGATIVE mg/dL
Hgb urine dipstick: NEGATIVE
Ketones, ur: NEGATIVE mg/dL
Leukocytes,Ua: NEGATIVE
Nitrite: NEGATIVE
Protein, ur: NEGATIVE mg/dL
Specific Gravity, Urine: 1.031 — ABNORMAL HIGH (ref 1.005–1.030)
pH: 5 (ref 5.0–8.0)

## 2022-04-22 LAB — RESPIRATORY PANEL BY PCR

## 2022-04-22 LAB — CBC
HCT: 40.9 % (ref 36.0–46.0)
Hemoglobin: 12.4 g/dL (ref 12.0–15.0)
MCH: 29.2 pg (ref 26.0–34.0)
MCHC: 30.3 g/dL (ref 30.0–36.0)
MCV: 96.5 fL (ref 80.0–100.0)
Platelets: 174 10*3/uL (ref 150–400)
RBC: 4.24 MIL/uL (ref 3.87–5.11)
RDW: 13.9 % (ref 11.5–15.5)
WBC: 12.8 10*3/uL — ABNORMAL HIGH (ref 4.0–10.5)
nRBC: 0 % (ref 0.0–0.2)

## 2022-04-22 LAB — HEPATITIS PANEL, ACUTE
HCV Ab: NONREACTIVE
Hep A IgM: NONREACTIVE
Hep B C IgM: NONREACTIVE
Hepatitis B Surface Ag: NONREACTIVE

## 2022-04-22 LAB — RAPID URINE DRUG SCREEN, HOSP PERFORMED
Amphetamines: NOT DETECTED
Barbiturates: NOT DETECTED
Benzodiazepines: POSITIVE — AB
Cocaine: NOT DETECTED
Opiates: POSITIVE — AB
Tetrahydrocannabinol: NOT DETECTED

## 2022-04-22 LAB — ECHOCARDIOGRAM COMPLETE
Area-P 1/2: 5.38 cm2
Height: 60 in
S' Lateral: 3.4 cm
Weight: 4400 oz

## 2022-04-22 LAB — PREALBUMIN: Prealbumin: 12 mg/dL — ABNORMAL LOW (ref 18–38)

## 2022-04-22 LAB — MAGNESIUM: Magnesium: 1.9 mg/dL (ref 1.7–2.4)

## 2022-04-22 LAB — PROTIME-INR
INR: 1.2 (ref 0.8–1.2)
Prothrombin Time: 15.5 seconds — ABNORMAL HIGH (ref 11.4–15.2)

## 2022-04-22 LAB — TSH: TSH: 0.585 u[IU]/mL (ref 0.350–4.500)

## 2022-04-22 LAB — PHOSPHORUS: Phosphorus: 4.8 mg/dL — ABNORMAL HIGH (ref 2.5–4.6)

## 2022-04-22 LAB — STREP PNEUMONIAE URINARY ANTIGEN: Strep Pneumo Urinary Antigen: NEGATIVE

## 2022-04-22 LAB — PROCALCITONIN: Procalcitonin: 9.54 ng/mL

## 2022-04-22 LAB — CK: Total CK: 48 U/L (ref 38–234)

## 2022-04-22 LAB — LACTIC ACID, PLASMA: Lactic Acid, Venous: 1.8 mmol/L (ref 0.5–1.9)

## 2022-04-22 LAB — CBG MONITORING, ED: Glucose-Capillary: 128 mg/dL — ABNORMAL HIGH (ref 70–99)

## 2022-04-22 LAB — APTT: aPTT: 35 seconds (ref 24–36)

## 2022-04-22 MED ORDER — SODIUM CHLORIDE 0.9 % IV SOLN: 2.0000 g | Freq: Every day | INTRAVENOUS | Status: DC

## 2022-04-22 MED ORDER — ROSUVASTATIN CALCIUM 20 MG PO TABS
20.0000 mg | ORAL_TABLET | Freq: Every day | ORAL | Status: DC
  Administered 2022-04-22 – 2022-04-23 (×2): 20 mg via ORAL
  Filled 2022-04-22 (×2): qty 1

## 2022-04-22 MED ORDER — IPRATROPIUM-ALBUTEROL 0.5-2.5 (3) MG/3ML IN SOLN
3.0000 mL | Freq: Four times a day (QID) | RESPIRATORY_TRACT | Status: DC
  Administered 2022-04-22 – 2022-04-23 (×6): 3 mL via RESPIRATORY_TRACT
  Filled 2022-04-22 (×6): qty 3

## 2022-04-22 MED ORDER — SODIUM CHLORIDE 0.9% FLUSH: 3.0000 mL | INTRAVENOUS | Status: DC | PRN

## 2022-04-22 MED ORDER — POTASSIUM CHLORIDE 10 MEQ/100ML IV SOLN
10.0000 meq | INTRAVENOUS | Status: AC
  Administered 2022-04-22 (×2): 10 meq via INTRAVENOUS
  Filled 2022-04-22 (×2): qty 100

## 2022-04-22 MED ORDER — SODIUM CHLORIDE 0.9 % IV SOLN
500.0000 mg | Freq: Every day | INTRAVENOUS | Status: DC
  Administered 2022-04-22 – 2022-04-23 (×3): 500 mg via INTRAVENOUS
  Filled 2022-04-22 (×4): qty 5

## 2022-04-22 MED ORDER — ALBUTEROL SULFATE (2.5 MG/3ML) 0.083% IN NEBU: 2.5000 mg | INHALATION_SOLUTION | RESPIRATORY_TRACT | Status: DC | PRN

## 2022-04-22 MED ORDER — ARIPIPRAZOLE 5 MG PO TABS
15.0000 mg | ORAL_TABLET | Freq: Every day | ORAL | Status: DC
  Administered 2022-04-22 – 2022-04-24 (×3): 15 mg via ORAL
  Filled 2022-04-22 (×3): qty 3

## 2022-04-22 MED ORDER — SODIUM CHLORIDE 0.9 % IV SOLN
2.0000 g | Freq: Every day | INTRAVENOUS | Status: DC
  Administered 2022-04-22 – 2022-04-24 (×3): 2 g via INTRAVENOUS
  Filled 2022-04-22 (×3): qty 20

## 2022-04-22 MED ORDER — ACETAMINOPHEN 325 MG PO TABS
650.0000 mg | ORAL_TABLET | Freq: Four times a day (QID) | ORAL | Status: DC | PRN
  Administered 2022-04-22 – 2022-04-24 (×8): 650 mg via ORAL
  Filled 2022-04-22 (×8): qty 2

## 2022-04-22 MED ORDER — NITROFURANTOIN MONOHYD MACRO 100 MG PO CAPS
100.0000 mg | ORAL_CAPSULE | Freq: Two times a day (BID) | ORAL | Status: DC
  Administered 2022-04-22: 100 mg via ORAL
  Filled 2022-04-22 (×2): qty 1

## 2022-04-22 MED ORDER — LEVOFLOXACIN 750 MG PO TABS: 750.0000 mg | ORAL_TABLET | Freq: Every day | ORAL | Status: DC

## 2022-04-22 MED ORDER — DULOXETINE HCL 60 MG PO CPEP
60.0000 mg | ORAL_CAPSULE | Freq: Two times a day (BID) | ORAL | Status: DC
  Administered 2022-04-22 – 2022-04-24 (×5): 60 mg via ORAL
  Filled 2022-04-22 (×2): qty 1
  Filled 2022-04-22: qty 2
  Filled 2022-04-22 (×2): qty 1

## 2022-04-22 MED ORDER — ACETAMINOPHEN 650 MG RE SUPP: 650.0000 mg | Freq: Four times a day (QID) | RECTAL | Status: DC | PRN

## 2022-04-22 MED ORDER — SODIUM CHLORIDE 0.9% FLUSH
3.0000 mL | Freq: Two times a day (BID) | INTRAVENOUS | Status: DC
  Administered 2022-04-22 – 2022-04-24 (×5): 3 mL via INTRAVENOUS

## 2022-04-22 MED ORDER — PANTOPRAZOLE SODIUM 40 MG PO TBEC
40.0000 mg | DELAYED_RELEASE_TABLET | Freq: Two times a day (BID) | ORAL | Status: DC
  Administered 2022-04-22 – 2022-04-24 (×5): 40 mg via ORAL
  Filled 2022-04-22 (×5): qty 1

## 2022-04-22 MED ORDER — OXCARBAZEPINE 150 MG PO TABS
300.0000 mg | ORAL_TABLET | Freq: Two times a day (BID) | ORAL | Status: DC
  Administered 2022-04-22 – 2022-04-24 (×5): 300 mg via ORAL
  Filled 2022-04-22 (×3): qty 2
  Filled 2022-04-22: qty 1
  Filled 2022-04-22: qty 2

## 2022-04-22 MED ORDER — BUPROPION HCL ER (XL) 300 MG PO TB24
300.0000 mg | ORAL_TABLET | Freq: Every day | ORAL | Status: DC
  Administered 2022-04-23 – 2022-04-24 (×2): 300 mg via ORAL
  Filled 2022-04-22 (×2): qty 1

## 2022-04-22 MED ORDER — GUAIFENESIN ER 600 MG PO TB12
600.0000 mg | ORAL_TABLET | Freq: Two times a day (BID) | ORAL | Status: DC
  Administered 2022-04-22 – 2022-04-24 (×5): 600 mg via ORAL
  Filled 2022-04-22 (×5): qty 1

## 2022-04-22 MED ORDER — SODIUM CHLORIDE 0.9 % IV SOLN: 250.0000 mL | INTRAVENOUS | Status: DC | PRN

## 2022-04-22 MED ORDER — PERFLUTREN LIPID MICROSPHERE
1.0000 mL | INTRAVENOUS | Status: AC | PRN
  Administered 2022-04-22: 2 mL via INTRAVENOUS

## 2022-04-22 MED ORDER — ALPRAZOLAM 0.5 MG PO TABS
0.5000 mg | ORAL_TABLET | Freq: Two times a day (BID) | ORAL | Status: DC | PRN
  Administered 2022-04-22 – 2022-04-23 (×3): 0.5 mg via ORAL
  Filled 2022-04-22 (×3): qty 1

## 2022-04-22 NOTE — Assessment & Plan Note (Signed)
Will replace check magnesium

## 2022-04-22 NOTE — Assessment & Plan Note (Signed)
Acute respiratory failure hypoxia etiology unclear Possible atypical pneumonia although CHF also on differential.  No prior history of seizure. Obtain procalcitonin Continue oxygen as needed Noted to have O2 sat sats below 90 on room air

## 2022-04-22 NOTE — Assessment & Plan Note (Signed)
Chronic patient with chronic diminished mobility

## 2022-04-22 NOTE — Assessment & Plan Note (Signed)
CT scan concerning for possible underlying pneumonia given the slightly elevated white blood cell count and cough Patient is allergic to cephalosporins started on Levaquin Check MRSA Respiratory panel COVID-negative obtain sputum cultures check for Legionella and strep pneumo

## 2022-04-22 NOTE — Assessment & Plan Note (Signed)
-  SIRS criteria met with  elevated white blood cell count,       Component Value Date/Time   WBC 11.3 (H) 04/21/2022 1846   LYMPHSABS 1.3 01/12/2022 1440   LYMPHSABS 2.8 11/03/2011 1036     tachycardia     RR >20 Today's Vitals   04/21/22 1826 04/21/22 1828 04/21/22 1829 04/21/22 1830  BP:    129/66  Pulse: (!) 103  (!) 103 (!) 103  Resp: 18  (!) 25 (!) 25  Temp:  99 F (37.2 C)    SpO2: 93%  95% 95%  Weight:  124.7 kg    Height:  5' (1.524 m)    PainSc:  0-No pain     Body mass index is 53.71 kg/m.    Vitals:   04/21/22 1826 04/21/22 1828 04/21/22 1829 04/21/22 1830  BP:    129/66  Pulse: (!) 103  (!) 103 (!) 103  Resp: 18  (!) 25 (!) 25  Temp:  99 F (37.2 C)    SpO2: 93%  95% 95%  Weight:  124.7 kg    Height:  5' (1.524 m)          -Most likely source being:  urinary, pulmonary, intra-abdominal,  Cellulitis, soft tissue infection, viral,   Patient meeting criteria for Severe sepsis with    evidence of end organ damage/organ dysfunction such as       acute metabolic encephalopathy   Acute hypoxia requiring new supplemental oxygen, SpO2: 95 %       Component Value Date/Time   PLT 177 04/21/2022 1846   PLT 271 11/03/2011 1036       - Obtain serial lactic acid and procalcitonin level.  - Initiated IV antibiotics in ER: Antibiotics Given (last 72 hours)     None       Will continue  on : Levaquin   - await results of blood and urine culture    1:10 AM

## 2022-04-22 NOTE — Assessment & Plan Note (Signed)
Chronic follow and obtain your electrolytes

## 2022-04-22 NOTE — Assessment & Plan Note (Signed)
Allow permissive hypertension for tonight 

## 2022-04-22 NOTE — ED Notes (Signed)
ED TO INPATIENT HANDOFF REPORT    S Name/Age/Gender [name] 51 y.o. female Room/Bed: 009C/009C  Code Status   Code Status: Full Code  Home/SNF/Other Home Patient oriented to: self, place, time, and situation Is this baseline? Yes   Triage Complete: Triage complete  Chief Complaint Acute respiratory failure ([place]) [J96.00]  Triage Note Pt bib ems from home; hx copd; not feeling well today; son came to find pt unresponsive, agonal; bagged for 10 min; king airway briefly in place; 0.3 IM epi given pta; 125 solumedrol, 1 duo neb, 5 albuterol  0.5 Atrovent; lung sound diminished initially throughout; 20 rac, IO in L leg; 105/66, hr 100 sinus, 100% RA, RR 24; pt a and o x 4, states "I feel fine!"; pt is currently being treated for UTI    Allergies Allergies  Allergen Reactions   Niacin Anaphylaxis, Shortness Of Breath and Swelling    Swelling and "problems breathing"   Sulfamethoxazole-Trimethoprim Anaphylaxis, Swelling, Rash and Other (See Comments)    Throat closed and the eyes became swollen   Aspirin Hives   Bactrim Hives   Benzoin Compound Rash   Cephalexin Hives    Tolerated ceftriaxone and cefepime 07/2019   Iohexol Hives    Pt treated with PO benedryl   Lisinopril Hives   Naproxen Hives   Pnu-Imune [Pneumococcal Polysaccharide Vaccine] Rash   Sulfonamide Derivatives Rash    Level of Care/Admitting Diagnosis ED Disposition     ED Disposition  Admit   Condition  --   [place]: [place] [100100]  Level of Care: Progressive [102]  Admit to Progressive based on following criteria: RESPIRATORY PROBLEMS hypoxemic/hypercapnic respiratory failure that is responsive to NIPPV (BiPAP) or High Flow Nasal Cannula (6-80 lpm). Frequent assessment/intervention, no > Q2 hrs < Q4 hrs, to maintain oxygenation and pulmonary hygiene.  May admit patient to [facility] or [facility] if equivalent level of care is available:: No  Covid  Evaluation: Confirmed COVID Negative  Diagnosis: Acute respiratory failure ([place]) [518.81.ICD-9-CM]  Admitting Physician: Toy Baker [[id number]]  Attending Physician: Toy Baker [[id number]]  Certification:: I certify this patient will need inpatient services for at least 2 midnights          B Medical/Surgery History Past Medical History:  Diagnosis Date   Allergic rhinitis    Ankle fracture, right    Anxiety    ASCUS of cervix with negative high risk HPV 05/2018   Carpal tunnel syndrome    Cervical cancer ([place]) 1998   Fibromyalgia    Hyperlipidemia    Hypertension    Migraine headache    Obesity    Sleep apnea    Past Surgical History:  Procedure Laterality Date   ABDOMINAL HYSTERECTOMY  10/1996   ABDOMINAL SURGERY  jan/11/2012   gastric bypass surgery   CARPAL TUNNEL RELEASE     bilateral    CESAREAN SECTION     CHOLECYSTECTOMY  07/1992   ESOPHAGOGASTRODUODENOSCOPY N/A 01/14/2016   Procedure: ESOPHAGOGASTRODUODENOSCOPY (EGD);  Surgeon: Mauri Pole, MD;  Location: [facility] ENDOSCOPY;  Service: Endoscopy;  Laterality: N/A;     A IV Location/Drains/Wounds Patient Lines/Drains/Airways Status     Active Line/Drains/Airways     Name Placement date Placement time Site Days   Peripheral IV 06/04/21 22 G Posterior;Right Hand 06/04/21  1716  Hand  322   Peripheral IV 04/21/22 20 G Left Antecubital 04/21/22  --  Antecubital  1   Peripheral IV 04/22/22 22 G Posterior;Right  Hand 04/22/22  0455  Hand  less than 1   External Urinary Catheter 07/27/19  1147  --  1000   External Urinary Catheter 06/04/21  1715  --  322   External Urinary Catheter 08/12/21  1914  --  253   Wound / Incision (Open or Dehisced) 08/01/19 Other (Comment) Leg Left 08/01/19  0600  Leg  995   Wound / Incision (Open or Dehisced) 06/04/21 Thigh Posterior;Right stage 2 ulcer, 2cm x 1.5cm,  foam dressing appled 06/04/21  1820  Thigh  322   Wound / Incision (Open or Dehisced) 06/04/21 (MASD) Moisture  Associated Skin Damage;Skin tear Thigh Left;Posterior stage 2,  0.5cm x 5cm 06/04/21  1830  Thigh  322   Wound / Incision (Open or Dehisced) 06/04/21 (MASD) Moisture Associated Skin Damage Hip Right Multiple skin tear stage 2 06/04/21  1820  Hip  322   Wound / Incision (Open or Dehisced) 06/04/21 Other (Comment) Hip Lateral;Right 06/04/21  1833  Hip  322   Wound / Incision (Open or Dehisced) 06/04/21 Skin tear;(MASD) Moisture Associated Skin Damage Hip Right multiole stage 2 ulcer, foam dressing applied 06/04/21  1830  Hip  322            Intake/Output Last 24 hours  Intake/Output Summary (Last 24 hours) at 04/22/2022 1359 Last data filed at 04/22/2022 0919 Gross per 24 hour  Intake 200.22 ml  Output --  Net 200.22 ml    Labs/Imaging Results for orders placed or performed during the hospital encounter of 04/21/22 (from the past 48 hour(s))  Resp Panel by RT-PCR (Flu A&B, Covid) Anterior Nasal Swab     Status: None   Collection Time: 04/21/22  6:46 PM   Specimen: Anterior Nasal Swab  Result Value Ref Range   SARS Coronavirus 2 by RT PCR NEGATIVE NEGATIVE    Comment: (NOTE) SARS-CoV-2 target nucleic acids are NOT DETECTED.  The SARS-CoV-2 RNA is generally detectable in upper respiratory specimens during the acute phase of infection. The lowest concentration of SARS-CoV-2 viral copies this assay can detect is 138 copies/mL. A negative result does not preclude SARS-Cov-2 infection and should not be used as the sole basis for treatment or other patient management decisions. A negative result may occur with  improper specimen collection/handling, submission of specimen other than nasopharyngeal swab, presence of viral mutation(s) within the areas targeted by this assay, and inadequate number of viral copies(<138 copies/mL). A negative result must be combined with clinical observations, patient history, and epidemiological information. The expected result is Negative.  Fact Sheet  for Patients:  EntrepreneurPulse.com.au  Fact Sheet for Healthcare Providers:  IncredibleEmployment.be  This test is no t yet approved or cleared by the [place] FDA and  has been authorized for detection and/or diagnosis of SARS-CoV-2 by FDA under an Emergency Use Authorization (EUA). This EUA will remain  in effect (meaning this test can be used) for the duration of the COVID-19 declaration under Section 564(b)(1) of the Act, 21 U.S.C.section 360bbb-3(b)(1), unless the authorization is terminated  or revoked sooner.       Influenza A by PCR NEGATIVE NEGATIVE   Influenza B by PCR NEGATIVE NEGATIVE    Comment: (NOTE) The Xpert Xpress SARS-CoV-2/FLU/RSV plus assay is intended as an aid in the diagnosis of influenza from Nasopharyngeal swab specimens and should not be used as a sole basis for treatment. Nasal washings and aspirates are unacceptable for Xpert Xpress SARS-CoV-2/FLU/RSV testing.  Fact Sheet for Patients: EntrepreneurPulse.com.au  Fact  Sheet for Healthcare Providers: IncredibleEmployment.be  This test is not yet approved or cleared by the [place] and has been authorized for detection and/or diagnosis of SARS-CoV-2 by FDA under an Emergency Use Authorization (EUA). This EUA will remain in effect (meaning this test can be used) for the duration of the COVID-19 declaration under Section 564(b)(1) of the Act, 21 U.S.C. section 360bbb-3(b)(1), unless the authorization is terminated or revoked.  Performed at [facility], [place] [address], [place], [place] [address]   Comprehensive metabolic panel     Status: Abnormal   Collection Time: 04/21/22  6:46 PM  Result Value Ref Range   Sodium 129 (L) 135 - 145 mmol/L   Potassium 3.3 (L) 3.5 - 5.1 mmol/L   Chloride 93 (L) 98 - 111 mmol/L   CO2 23 22 - 32 mmol/L   Glucose, Bld 92 70 - 99 mg/dL    Comment: Glucose reference range  applies only to samples taken after fasting for at least 8 hours.   BUN 14 6 - 20 mg/dL   Creatinine, Ser 1.03 (H) 0.44 - 1.00 mg/dL   Calcium 7.8 (L) 8.9 - 10.3 mg/dL   Total Protein 6.4 (L) 6.5 - 8.1 g/dL   Albumin 2.8 (L) 3.5 - 5.0 g/dL   AST 165 (H) 15 - 41 U/L   ALT 117 (H) 0 - 44 U/L   Alkaline Phosphatase 196 (H) 38 - 126 U/L   Total Bilirubin 2.1 (H) 0.3 - 1.2 mg/dL   GFR, Estimated >60 >60 mL/min    Comment: (NOTE) Calculated using the CKD-EPI Creatinine Equation (2021)    Anion gap 13 5 - 15    Comment: Performed at [facility] [address] [place], [place] [address]  CBC     Status: Abnormal   Collection Time: 04/21/22  6:46 PM  Result Value Ref Range   WBC 11.3 (H) 4.0 - 10.5 K/uL   RBC 4.30 3.87 - 5.11 MIL/uL   Hemoglobin 12.8 12.0 - 15.0 g/dL   HCT 40.8 36.0 - 46.0 %   MCV 94.9 80.0 - 100.0 fL   MCH 29.8 26.0 - 34.0 pg   MCHC 31.4 30.0 - 36.0 g/dL   RDW 13.7 11.5 - 15.5 %   Platelets 177 150 - 400 K/uL   nRBC 0.0 0.0 - 0.2 %    Comment: Performed at [facility], [place] [address] [place], [place] [address]  Troponin I (High Sensitivity)     Status: None   Collection Time: 04/21/22  6:46 PM  Result Value Ref Range   Troponin I (High Sensitivity) 7 <18 ng/L    Comment: (NOTE) Elevated high sensitivity troponin I (hsTnI) values and significant  changes across serial measurements may suggest ACS but many other  chronic and acute conditions are known to elevate hsTnI results.  Refer to the "Links" section for chest pain algorithms and additional  guidance. Performed at [facility], [place] [address], [place], [place] [address]   Brain natriuretic peptide     Status: Abnormal   Collection Time: 04/21/22  6:46 PM  Result Value Ref Range   B Natriuretic Peptide 127.6 (H) 0.0 - 100.0 pg/mL    Comment: Performed at [facility] [address], [place], [place] [address]  Respiratory (~20 pathogens) panel by PCR     Status: None    Collection Time: 04/21/22  6:46 PM   Specimen: Nasopharyngeal Swab; Respiratory  Result Value Ref Range   Adenovirus NOT DETECTED NOT DETECTED   Coronavirus 229E NOT DETECTED NOT DETECTED    Comment: (NOTE) The Coronavirus on the Respiratory Panel, DOES NOT test for the novel  Coronavirus (2019 nCoV)    Coronavirus HKU1 NOT DETECTED NOT DETECTED   Coronavirus NL63 NOT DETECTED NOT DETECTED   Coronavirus OC43 NOT DETECTED NOT DETECTED   Metapneumovirus NOT DETECTED NOT DETECTED   Rhinovirus / Enterovirus NOT DETECTED NOT DETECTED   Influenza A NOT DETECTED NOT DETECTED   Influenza B NOT DETECTED NOT DETECTED   Parainfluenza Virus 1 NOT DETECTED NOT DETECTED   Parainfluenza Virus 2 NOT DETECTED NOT DETECTED   Parainfluenza Virus 3 NOT DETECTED NOT DETECTED   Parainfluenza Virus 4 NOT DETECTED NOT DETECTED   Respiratory Syncytial Virus NOT DETECTED NOT DETECTED   Bordetella pertussis NOT DETECTED NOT DETECTED   Bordetella Parapertussis NOT DETECTED NOT DETECTED   Chlamydophila pneumoniae NOT DETECTED NOT DETECTED   Mycoplasma pneumoniae NOT DETECTED NOT DETECTED    Comment: Performed at [facility], [place] [address], [place], [place] [address]  I-Stat Chem 8, ED     Status: Abnormal   Collection Time: 04/21/22  6:57 PM  Result Value Ref Range   Sodium 133 (L) 135 - 145 mmol/L   Potassium 3.3 (L) 3.5 - 5.1 mmol/L   Chloride 93 (L) 98 - 111 mmol/L   BUN 17 6 - 20 mg/dL   Creatinine, Ser 1.10 (H) 0.44 - 1.00 mg/dL   Glucose, Bld 90 70 - 99 mg/dL    Comment: Glucose reference range applies only to samples taken after fasting for at least 8 hours.   Calcium, Ion 1.00 (L) 1.15 - 1.40 mmol/L   TCO2 25 22 - 32 mmol/L   Hemoglobin 14.3 12.0 - 15.0 g/dL   HCT 42.0 36.0 - 46.0 %  I-Stat venous blood gas, ED     Status: Abnormal   Collection Time: 04/21/22  6:59 PM  Result Value Ref Range   pH, Ven 7.284 7.25 - 7.43   pCO2, Ven 54.7 44 - 60 mmHg   pO2, Ven 45 32 - 45 mmHg    Bicarbonate 25.9 20.0 - 28.0 mmol/L   TCO2 28 22 - 32 mmol/L   O2 Saturation 74 %   Acid-base deficit 2.0 0.0 - 2.0 mmol/L   Sodium 133 (L) 135 - 145 mmol/L   Potassium 3.3 (L) 3.5 - 5.1 mmol/L   Calcium, Ion 1.00 (L) 1.15 - 1.40 mmol/L   HCT 42.0 36.0 - 46.0 %   Hemoglobin 14.3 12.0 - 15.0 g/dL   Sample type VENOUS   Troponin I (High Sensitivity)     Status: None   Collection Time: 04/21/22  9:45 PM  Result Value Ref Range   Troponin I (High Sensitivity) 10 <18 ng/L    Comment: (NOTE) Elevated high sensitivity troponin I (hsTnI) values and significant  changes across serial measurements may suggest ACS but many other  chronic and acute conditions are known to elevate hsTnI results.  Refer to the "Links" section for chest pain algorithms and additional  guidance. Performed at [facility], [place] [address], [place], [place] [address]   I-Stat arterial blood gas, ED [facility] ED only)     Status: Abnormal   Collection Time: 04/21/22 11:53 PM  Result Value Ref Range   pH, Arterial 7.324 (L) 7.35 - 7.45   pCO2 arterial 49.3 (H) 32 - 48 mmHg  pO2, Arterial 56 (L) 83 - 108 mmHg   Bicarbonate 25.6 20.0 - 28.0 mmol/L   TCO2 27 22 - 32 mmol/L   O2 Saturation 85 %   Acid-base deficit 1.0 0.0 - 2.0 mmol/L   Sodium 131 (L) 135 - 145 mmol/L   Potassium 3.9 3.5 - 5.1 mmol/L   Calcium, Ion 1.08 (L) 1.15 - 1.40 mmol/L   HCT 40.0 36.0 - 46.0 %   Hemoglobin 13.6 12.0 - 15.0 g/dL   Patient temperature 99.0 F    Collection site RADIAL, ALLEN'S TEST ACCEPTABLE    Drawn by RT    Sample type ARTERIAL   CBG monitoring, ED     Status: Abnormal   Collection Time: 04/22/22 12:27 AM  Result Value Ref Range   Glucose-Capillary 128 (H) 70 - 99 mg/dL    Comment: Glucose reference range applies only to samples taken after fasting for at least 8 hours.  CK     Status: None   Collection Time: 04/22/22  5:13 AM  Result Value Ref Range   Total CK 48 38 - 234 U/L    Comment: Performed at [facility], [place] [address], [place], [place] [address]  TSH     Status: None   Collection Time: 04/22/22  5:13 AM  Result Value Ref Range   TSH 0.585 0.350 - 4.500 uIU/mL    Comment: Performed by a 3rd Generation assay with a functional sensitivity of <=0.01 uIU/mL. Performed at [facility], [place] [address] [place], [place] [address]   Protime-INR     Status: Abnormal   Collection Time: 04/22/22  5:13 AM  Result Value Ref Range   Prothrombin Time 15.5 (H) 11.4 - 15.2 seconds   INR 1.2 0.8 - 1.2    Comment: (NOTE) INR goal varies based on device and disease states. Performed at [facility], [place] [address], [place], [place] [address]   Prealbumin     Status: Abnormal   Collection Time: 04/22/22  5:13 AM  Result Value Ref Range   Prealbumin 12 (L) 18 - 38 mg/dL    Comment: Performed at [facility] [address], [place], [place] [address]  Lactic acid, plasma     Status: None   Collection Time: 04/22/22  5:13 AM  Result Value Ref Range   Lactic Acid, Venous 1.8 0.5 - 1.9 mmol/L    Comment: Performed at [facility] [address] [place], [place] [address]  Procalcitonin     Status: None   Collection Time: 04/22/22  5:13 AM  Result Value Ref Range   Procalcitonin 9.54 ng/mL    Comment:        Interpretation: PCT > 2 ng/mL: Systemic infection (sepsis) is likely, unless other causes are known. (NOTE)       Sepsis PCT Algorithm           Lower Respiratory Tract                                      Infection PCT Algorithm    ----------------------------     ----------------------------         PCT < 0.25 ng/mL                PCT < 0.10 ng/mL          Strongly encourage  Strongly discourage   discontinuation of antibiotics    initiation of antibiotics    ----------------------------     -----------------------------       PCT 0.25 - 0.50 ng/mL            PCT 0.10 - 0.25 ng/mL               OR       >80% decrease in PCT             Discourage initiation of                                            antibiotics      Encourage discontinuation           of antibiotics    ----------------------------     -----------------------------         PCT >= 0.50 ng/mL              PCT 0.26 - 0.50 ng/mL               AND       <80% decrease in PCT              Encourage initiation of                                             antibiotics       Encourage continuation           of antibiotics    ----------------------------     -----------------------------        PCT >= 0.50 ng/mL                  PCT > 0.50 ng/mL               AND         increase in PCT                  Strongly encourage                                      initiation of antibiotics    Strongly encourage escalation           of antibiotics                                     -----------------------------                                           PCT <= 0.25 ng/mL                                                 OR                                        >  80% decrease in PCT                                      Discontinue / Do not initiate                                             antibiotics  Performed at [facility], [place] [address], [place], [place] [address]   Comprehensive metabolic panel     Status: Abnormal   Collection Time: 04/22/22  5:13 AM  Result Value Ref Range   Sodium 130 (L) 135 - 145 mmol/L   Potassium 4.3 3.5 - 5.1 mmol/L   Chloride 95 (L) 98 - 111 mmol/L   CO2 21 (L) 22 - 32 mmol/L   Glucose, Bld 134 (H) 70 - 99 mg/dL    Comment: Glucose reference range applies only to samples taken after fasting for at least 8 hours.   BUN 17 6 - 20 mg/dL   Creatinine, Ser 0.88 0.44 - 1.00 mg/dL   Calcium 8.0 (L) 8.9 - 10.3 mg/dL   Total Protein 6.6 6.5 - 8.1 g/dL   Albumin 3.1 (L) 3.5 - 5.0 g/dL   AST 126 (H) 15 - 41 U/L   ALT 106 (H) 0 - 44 U/L   Alkaline Phosphatase 187 (H) 38 - 126 U/L   Total Bilirubin 1.6 (H) 0.3 - 1.2 mg/dL    GFR, Estimated >60 >60 mL/min    Comment: (NOTE) Calculated using the CKD-EPI Creatinine Equation (2021)    Anion gap 14 5 - 15    Comment: Performed at [facility], [place] [address], [place], [place] [address]  CBC     Status: Abnormal   Collection Time: 04/22/22  5:13 AM  Result Value Ref Range   WBC 12.8 (H) 4.0 - 10.5 K/uL   RBC 4.24 3.87 - 5.11 MIL/uL   Hemoglobin 12.4 12.0 - 15.0 g/dL   HCT 40.9 36.0 - 46.0 %   MCV 96.5 80.0 - 100.0 fL   MCH 29.2 26.0 - 34.0 pg   MCHC 30.3 30.0 - 36.0 g/dL   RDW 13.9 11.5 - 15.5 %   Platelets 174 150 - 400 K/uL   nRBC 0.0 0.0 - 0.2 %    Comment: Performed at [facility], [place] [address], [place], [name] [address]  Magnesium     Status: None   Collection Time: 04/22/22  5:13 AM  Result Value Ref Range   Magnesium 1.9 1.7 - 2.4 mg/dL    Comment: Performed at [facility] [address], [place], [place] [address]  Phosphorus     Status: Abnormal   Collection Time: 04/22/22  5:13 AM  Result Value Ref Range   Phosphorus 4.8 (H) 2.5 - 4.6 mg/dL    Comment: Performed at [facility] [address] [place], [place] [address]  Hepatitis panel, acute     Status: None   Collection Time: 04/22/22  5:13 AM  Result Value Ref Range   Hepatitis B Surface Ag NON REACTIVE NON REACTIVE   HCV Ab NON REACTIVE NON REACTIVE    Comment: (NOTE) Nonreactive HCV antibody screen is consistent with no HCV infections,  unless recent infection is suspected or other evidence exists to  indicate HCV infection.     Hep A IgM NON REACTIVE NON REACTIVE   Hep B C IgM NON REACTIVE NON REACTIVE    Comment: Performed at [facility], [place] [address], [place], [place] [address]  APTT     Status: None   Collection Time: 04/22/22  5:13 AM  Result Value Ref Range   aPTT 35 24 - 36 seconds    Comment: Performed at [facility] [address] [place], [place] [address]  Urinalysis, Routine w reflex microscopic Urine, Clean Catch      Status: Abnormal   Collection Time: 04/22/22  5:44 AM  Result Value Ref Range   Color, Urine AMBER (A) YELLOW    Comment: BIOCHEMICALS MAY BE AFFECTED BY COLOR   APPearance CLEAR CLEAR   Specific Gravity, Urine 1.031 (H) 1.005 - 1.030   pH 5.0 5.0 - 8.0   Glucose, UA NEGATIVE NEGATIVE mg/dL   Hgb urine dipstick NEGATIVE NEGATIVE   Bilirubin Urine NEGATIVE NEGATIVE   Ketones, ur NEGATIVE NEGATIVE mg/dL   Protein, ur NEGATIVE NEGATIVE mg/dL   Nitrite NEGATIVE NEGATIVE   Leukocytes,Ua NEGATIVE NEGATIVE    Comment: Performed at [facility] [address] [place], [place] [address]  Rapid urine drug screen (hospital performed)     Status: Abnormal   Collection Time: 04/22/22  5:44 AM  Result Value Ref Range   Opiates POSITIVE (A) NONE DETECTED   Cocaine NONE DETECTED NONE DETECTED   Benzodiazepines POSITIVE (A) NONE DETECTED   Amphetamines NONE DETECTED NONE DETECTED   Tetrahydrocannabinol NONE DETECTED NONE DETECTED   Barbiturates NONE DETECTED NONE DETECTED    Comment: (NOTE) DRUG SCREEN FOR MEDICAL PURPOSES ONLY.  IF CONFIRMATION IS NEEDED FOR ANY PURPOSE, NOTIFY LAB WITHIN 5 DAYS.  LOWEST DETECTABLE LIMITS FOR URINE DRUG SCREEN Drug Class                     Cutoff (ng/mL) Amphetamine and metabolites    1000 Barbiturate and metabolites    200 Benzodiazepine                 200 Opiates and metabolites        300 Cocaine and metabolites        300 THC                            50 Performed at [facility], [place] [address] [place], [place] [address]   Strep pneumoniae urinary antigen     Status: None   Collection Time: 04/22/22  5:44 AM  Result Value Ref Range   Strep Pneumo Urinary Antigen NEGATIVE NEGATIVE    Comment:        Infection due to S. pneumoniae cannot be absolutely ruled out since the antigen present may be below the detection limit of the test. Performed at [facility], [place] [address] [place], [place] [address]    *Note: Due  to a large number of results and/or encounters for the requested time period, some results have not been displayed. A complete set of results can be found in Results Review.   CT ANGIO CHEST PE W OR WO CONTRAST  Result Date: 04/21/2022 CLINICAL DATA:  Pulmonary embolism suspected, high probability. Shortness of breath. EXAM: CT ANGIOGRAPHY CHEST WITH CONTRAST TECHNIQUE: Multidetector CT imaging of the chest was performed using the standard protocol during bolus administration of  intravenous contrast. Multiplanar CT image reconstructions and MIPs were obtained to evaluate the vascular anatomy. RADIATION DOSE REDUCTION: This exam was performed according to the departmental dose-optimization program which includes automated exposure control, adjustment of the mA and/or kV according to patient size and/or use of iterative reconstruction technique. CONTRAST:  52m OMNIPAQUE IOHEXOL 350 MG/ML SOLN COMPARISON:  08/12/2021. FINDINGS: Cardiovascular: Heart is enlarged and there is no pericardial effusion. The aorta and pulmonary trunk are normal in caliber. No large central pulmonary embolism is identified. Evaluation of the pulmonary arteries is limited due to suboptimal opacification, patient's body habitus, and respiratory motion artifact. Mediastinum/Nodes: No enlarged mediastinal, hilar, or axillary lymph nodes. Thyroid gland, trachea, and esophagus demonstrate no significant findings. Lungs/Pleura: Hazy ground-glass opacities are present in the lungs bilaterally. Atelectasis is present at the lung bases. No effusion or pneumothorax. Upper Abdomen: Gastric surgery changes are noted. No acute abnormality. Musculoskeletal: Degenerative changes in the thoracic spine. Stable compression deformities are present at T6 and T12. No acute osseous abnormality. Review of the MIP images confirms the above findings. IMPRESSION: 1. No large central pulmonary embolus. Evaluation of the distal pulmonary arteries is limited due to  patient's body habitus, suboptimal opacification, and respiratory motion artifact. 2. Hazy ground-glass opacities in the lungs bilaterally, possible edema or infiltrate. 3. Cardiomegaly. Electronically Signed   By: LBrett FairyM.D.   On: 04/21/2022 21:18   DG Chest Port 1 View  Result Date: 04/21/2022 CLINICAL DATA:  Shortness of breath EXAM: PORTABLE CHEST 1 VIEW COMPARISON:  08/12/2021 FINDINGS: Low lung volumes. Cardiomegaly with vascular congestion. Right base opacity, favor atelectasis. No effusions or pneumothorax. No acute bony abnormality. IMPRESSION: Low lung volumes with right base atelectasis. Cardiomegaly, vascular congestion. Electronically Signed   By: KRolm BaptiseM.D.   On: 04/21/2022 19:36    Pending Labs Unresulted Labs (From admission, onward)     Start     Ordered   04/22/22 0500  Procalcitonin  Daily,   R      04/21/22 2343   04/22/22 0403  Expectorated Sputum Assessment w Gram Stain, Rflx to Resp Cult  (COPD / Pneumonia / Cellulitis / Lower Extremity Wound)  Once,   R        04/22/22 0402   04/22/22 0403  Legionella Pneumophila Serogp 1 Ur Ag  (COPD / Pneumonia / Cellulitis / Lower Extremity Wound)  Once,   R        04/22/22 0402   04/22/22 0032  Urine Culture  (Urine Culture)  Once,   R       Question:  Indication  Answer:  Sepsis   04/22/22 0031   04/22/22 0030  Culture, blood (x 2)  BLOOD CULTURE X 2,   R     Comments: INITIATE ANTIBIOTICS WITHIN 1 HOUR AFTER BLOOD CULTURES DRAWN.  If unable to obtain blood cultures, call MD immediately regarding antibiotic instructions.    04/22/22 0031   04/22/22 0030  Lactic acid, plasma  STAT Now then every 2 hours,   R      04/22/22 0031   04/21/22 2344  Urine drugs of abuse scrn w alc, routine (Ref Lab)  Once,   URGENT        04/21/22 2343   04/21/22 2326  Ammonia  Once,   STAT       Question:  Release to patient  Answer:  Immediate   04/21/22 2326   04/21/22 2326  Osmolality, urine  Once,   URGENT  04/21/22 2326    04/21/22 2326  Osmolality  Add-on,   AD        04/21/22 2326   04/21/22 2326  Lactic acid, plasma  STAT Now then every 3 hours,   R     Question:  Release to patient  Answer:  Immediate   04/21/22 2326            Vitals/Pain Today's Vitals   04/22/22 1006 04/22/22 1015 04/22/22 1130 04/22/22 1200  BP:  110/64 109/87 (!) 109/58  Pulse:  81 80 75  Resp:  (!) '25 20 20  '$ Temp: 98.4 F (36.9 C)     TempSrc: Oral     SpO2:  93% 96% 96%  Weight:      Height:      PainSc:        Isolation Precautions Droplet precaution  Medications Medications  potassium chloride 10 mEq in 100 mL IVPB (10 mEq Intravenous Not Given 04/22/22 0447)  azithromycin (ZITHROMAX) 500 mg in sodium chloride 0.9 % 250 mL IVPB (0 mg Intravenous Stopped 04/22/22 0841)  buPROPion (WELLBUTRIN XL) 24 hr tablet 300 mg (0 mg Oral Hold 04/22/22 0935)  DULoxetine (CYMBALTA) DR capsule 60 mg (60 mg Oral Given 04/22/22 0916)  Oxcarbazepine (TRILEPTAL) tablet 300 mg (300 mg Oral Given 04/22/22 0917)  pantoprazole (PROTONIX) EC tablet 40 mg (40 mg Oral Given 04/22/22 0917)  rosuvastatin (CRESTOR) tablet 20 mg (20 mg Oral Given 04/22/22 0916)  acetaminophen (TYLENOL) tablet 650 mg (650 mg Oral Given 04/22/22 0922)    Or  acetaminophen (TYLENOL) suppository 650 mg ( Rectal See Alternative 04/22/22 0922)  sodium chloride flush (NS) 0.9 % injection 3 mL (3 mLs Intravenous Given 04/22/22 0924)  sodium chloride flush (NS) 0.9 % injection 3 mL (has no administration in time range)  0.9 %  sodium chloride infusion (has no administration in time range)  albuterol (PROVENTIL) (2.5 MG/3ML) 0.083% nebulizer solution 2.5 mg (has no administration in time range)  guaiFENesin (MUCINEX) 12 hr tablet 600 mg (600 mg Oral Given 04/22/22 0917)  ipratropium-albuterol (DUONEB) 0.5-2.5 (3) MG/3ML nebulizer solution 3 mL (3 mLs Nebulization Given 04/22/22 0844)  cefTRIAXone (ROCEPHIN) 2 g in sodium chloride 0.9 % 100 mL IVPB (0 g Intravenous Stopped  04/22/22 0919)  perflutren lipid microspheres (DEFINITY) IV suspension (2 mLs Intravenous Given 04/22/22 1103)  diphenhydrAMINE (BENADRYL) injection 50 mg (50 mg Intravenous Given 04/21/22 2026)  methylPREDNISolone sodium succinate (SOLU-MEDROL) 125 mg/2 mL injection 125 mg (125 mg Intravenous Given 04/21/22 1958)  iohexol (OMNIPAQUE) 350 MG/ML injection 75 mL (75 mLs Intravenous Contrast Given 04/21/22 2102)  potassium chloride 10 mEq in 100 mL IVPB (0 mEq Intravenous Stopped 04/22/22 0841)    Mobility walks with device High fall risk   Focused Assessments Pulmonary Assessment Handoff:  Lung sounds:   O2 Device: Nasal Cannula O2 Flow Rate (L/min): 2 L/min    R Recommendations: See Admitting Provider Note  Report given to:

## 2022-04-22 NOTE — ED Notes (Signed)
2LNC applied 

## 2022-04-22 NOTE — Telephone Encounter (Signed)
Forms faxed and placed in scan  

## 2022-04-22 NOTE — Hospital Course (Addendum)
51 year old woman PMH including myelomalacia of cervical cord, chronic narcotic use, [name], obesity, presented after being found down, unresponsive with agonal breathing.  Recently treated for chest congestion with amoxicillin.  Admitted for acute hypoxic respiratory failure, sepsis.

## 2022-04-22 NOTE — Assessment & Plan Note (Signed)
Hold off on sedation medication

## 2022-04-22 NOTE — Assessment & Plan Note (Signed)
Resume CPAP

## 2022-04-22 NOTE — Telephone Encounter (Signed)
Signed and returned to [facility] 

## 2022-04-22 NOTE — Assessment & Plan Note (Signed)
Chronic follow-up as an outpatient Could be contributing to hypoxia

## 2022-04-22 NOTE — Assessment & Plan Note (Addendum)
Resume Crestor 20 mg p.o. daily

## 2022-04-22 NOTE — Progress Notes (Signed)
  Progress Note   Patient: [name] WUX:[id number] DOB: 11/07/1970 DOA: 04/21/2022     0 DOS: the patient was seen and examined on 04/22/2022   Brief hospital course: 51 year old woman PMH including myelomalacia of cervical cord, chronic narcotic use, [name], obesity, presented after being found down, unresponsive with agonal breathing.  Recently treated for chest congestion with amoxicillin.  Admitted for acute hypoxic respiratory failure, sepsis.  Assessment and Plan: Acute hypoxic respiratory failure, sepsis, acute metabolic encephalopathy Presumably secondary to pneumonia Appears stable today.  Wean oxygen as tolerated.  Metabolic encephalopathy appears resolved. Remove IO  Bilateral pneumonia, community acquired Continue empiric antibiotics.   Hyperlipidemia Crestor 20 mg p.o. daily   Obesity, Class III, BMI 40-49.9 (morbid obesity) ([place]) May be developing OHS.   Anxiety and depression Hold off on sedation medication   Hyponatremia New, mild, etiology unclear, follow-up BMP in a.m.   [name] (obstructive sleep apnea) CPAP  Morbid obesity Body mass index is 53.71 kg/m. Follow-up as an outpatient  Compressive cervical cord myelomalacia (HCC) Chronic patient with chronic diminished mobility       Subjective:  Feels 110% better Does not use oxygen at home  Physical Exam: Vitals:   04/22/22 1400 04/22/22 1415 04/22/22 1430 04/22/22 1745  BP:   109/73   Pulse:  76 80   Resp:  17 (!) 23   Temp: 98.2 F (36.8 C)   98.6 F (37 C)  TempSrc: Oral   Oral  SpO2:  95% 97%   Weight:      Height:       Physical Exam Vitals reviewed.  Cardiovascular:     Rate and Rhythm: Normal rate and regular rhythm.     Heart sounds: No murmur heard. Pulmonary:     Effort: Pulmonary effort is normal. No respiratory distress.     Breath sounds: No wheezing, rhonchi or rales.  Musculoskeletal:     Right lower leg: Edema present.     Left lower leg: Edema present.  Neurological:      Mental Status: She is alert.  Psychiatric:        Mood and Affect: Mood normal.        Behavior: Behavior normal.     Data Reviewed: Echo noted UDS noted  Urinalysis noted  Family Communication: noen  Disposition: Status is: Inpatient Remains inpatient appropriate because: hypoxia  Planned Discharge Destination: Home    Time spent: 30 minutes  Author: Murray Hodgkins, MD 04/22/2022 5:51 PM  For on call review [email or web address].

## 2022-04-22 NOTE — ED Notes (Signed)
Patient transported to ECHO.

## 2022-04-23 LAB — URINE CULTURE

## 2022-04-23 LAB — OSMOLALITY, URINE: Osmolality, Ur: 381 mOsm/kg (ref 300–900)

## 2022-04-23 LAB — OSMOLALITY: Osmolality: 287 mOsm/kg (ref 275–295)

## 2022-04-23 NOTE — Evaluation (Signed)
Occupational Therapy Evaluation Patient Details Name: [name] MRN: [id number] DOB: [birth year] Today's Date: 04/23/2022   History of Present Illness Pt isa 51 y/o F presenting to ED on 12/7 after being found unresponsive by son; admitted for acute hypoxic respiratory failure. PMH includes HLD, HTN, compressive cervical myelomalacia, obesity   Clinical Impression   Pt reports needing assist at baseline from son/family for ADLs, able to stand with assist but for transfers currently uses hoyer lift. Lives with family who can assist at d/c, reports her son is always home. Pt currently needing min-max A for ADLs, mod-max A for bed mobility, and max-total A +2 for standing attempts x2, pt unable to clear hips and reports knee pain. Pt needing total A +2 to scoot laterally in supine to move to air mattress bed. Pt presenting with impairments listed below, will follow acutely. Recommend HHOT at d/c as long as family able to provide level of assist needed.     Recommendations for follow up therapy are one component of a multi-disciplinary discharge planning process, led by the attending physician.  Recommendations may be updated based on patient status, additional functional criteria and insurance authorization.   Follow Up Recommendations  Home health OT     Assistance Recommended at Discharge Frequent or constant Supervision/Assistance  Patient can return home with the following A lot of help with bathing/dressing/bathroom;Two people to help with walking and/or transfers;Assistance with cooking/housework;Direct supervision/assist for medications management;Direct supervision/assist for financial management;Assist for transportation;Help with stairs or ramp for entrance    Functional Status Assessment  Patient has had a recent decline in their functional status and demonstrates the ability to make significant improvements in function in a reasonable and predictable amount of time.  Equipment  Recommendations  BSC/3in1 (bariatric)    Recommendations for Other Services PT consult     Precautions / Restrictions Precautions Precautions: Fall Restrictions Weight Bearing Restrictions: No      Mobility Bed Mobility Overal bed mobility: Needs Assistance Bed Mobility: Sidelying to Sit, Sit to Supine   Sidelying to sit: Mod assist, Max assist, +2 for physical assistance   Sit to supine: Mod assist, Max assist, +2 for physical assistance   General bed mobility comments: for trunk elevation and scooting of hips to EOB    Transfers Overall transfer level: Needs assistance Equipment used: 2 person hand held assist Transfers: Sit to/from Stand Sit to Stand: Total assist, +2 physical assistance           General transfer comment: pt unable to clear hips, attempted x2      Balance Overall balance assessment: Needs assistance Sitting-balance support: Feet supported Sitting balance-Leahy Scale: Fair       Standing balance-Leahy Scale: Zero                             ADL either performed or assessed with clinical judgement   ADL Overall ADL's : Needs assistance/impaired Eating/Feeding: Set up   Grooming: Minimal assistance   Upper Body Bathing: Moderate assistance   Lower Body Bathing: Maximal assistance   Upper Body Dressing : Moderate assistance   Lower Body Dressing: Maximal assistance   Toilet Transfer: Maximal assistance;+2 for physical assistance   Toileting- Clothing Manipulation and Hygiene: Total assistance       Functional mobility during ADLs: Maximal assistance;+2 for physical assistance       Vision   Vision Assessment?: No apparent visual deficits     Perception  Perception Perception Tested?: No   Praxis Praxis Praxis tested?: Not tested    Pertinent Vitals/Pain Pain Assessment Pain Assessment: Faces Pain Score: 7  Faces Pain Scale: Hurts whole lot Pain Location: BLE with donning socks ( per pt has arthritis  pain) Pain Descriptors / Indicators: Discomfort, Grimacing Pain Intervention(s): Limited activity within patient's tolerance, Monitored during session, Repositioned     Hand Dominance Right   Extremity/Trunk Assessment Upper Extremity Assessment Upper Extremity Assessment: Generalized weakness   Lower Extremity Assessment Lower Extremity Assessment: Defer to PT evaluation       Communication Communication Communication: No difficulties   Cognition Arousal/Alertness: Awake/alert Behavior During Therapy: WFL for tasks assessed/performed Overall Cognitive Status: Within Functional Limits for tasks assessed                                       General Comments  VSS on RA    Exercises     Shoulder Instructions      Home Living Family/patient expects to be discharged to:: Private residence Living Arrangements: Children;Parent Available Help at Discharge: Family;Available 24 hours/day Type of Home: House Home Access: Level entry     Home Layout: Two level;Able to live on main level with bedroom/bathroom     Bathroom Shower/Tub: Walk-in shower;Tub/shower unit   Bathroom Toilet: Standard Bathroom Accessibility: Yes   Home Equipment: Rollator (4 wheels);Shower seat;Hospital bed;Wheelchair - manual   Additional Comments: slide board      Prior Functioning/Environment Prior Level of Function : Needs assist             Mobility Comments: can stand, uses hoyer lift to get to chair, recieves HHPT at home ADLs Comments: assist for ADLs from son, active with HHOT        OT Problem List: Decreased strength;Decreased range of motion;Decreased activity tolerance;Impaired balance (sitting and/or standing);Obesity      OT Treatment/Interventions: Self-care/ADL training;Therapeutic exercise;Energy conservation;DME and/or AE instruction;Therapeutic activities;Patient/family education;Balance training    OT Goals(Current goals can be found in the care  plan section) Acute Rehab OT Goals Patient Stated Goal: none stated OT Goal Formulation: With patient Time For Goal Achievement: 05/07/22 Potential to Achieve Goals: Good ADL Goals Pt Will Perform Upper Body Dressing: with min assist;sitting Pt Will Perform Lower Body Dressing: with mod assist;sitting/lateral leans;sit to/from stand;bed level Pt Will Transfer to Toilet: with mod assist;with +2 assist;squat pivot transfer;stand pivot transfer;bedside commode  OT Frequency: Min 2X/week    Co-evaluation PT/OT/SLP Co-Evaluation/Treatment: Yes Reason for Co-Treatment: Complexity of the patient's impairments (multi-system involvement);For patient/therapist safety;To address functional/ADL transfers   OT goals addressed during session: ADL's and self-care      AM-PAC OT "6 Clicks" Daily Activity     Outcome Measure Help from another person eating meals?: None Help from another person taking care of personal grooming?: A Little Help from another person toileting, which includes using toliet, bedpan, or urinal?: A Lot Help from another person bathing (including washing, rinsing, drying)?: A Lot Help from another person to put on and taking off regular upper body clothing?: A Lot Help from another person to put on and taking off regular lower body clothing?: Total 6 Click Score: 14   End of Session Equipment Utilized During Treatment: Gait belt Nurse Communication: Mobility status  Activity Tolerance: Patient tolerated treatment well Patient left: in bed;with call bell/phone within reach  OT Visit Diagnosis: Unsteadiness on feet (R26.81);Other abnormalities of  gait and mobility (R26.89);Muscle weakness (generalized) (M62.81)                Time: 6691-6756 OT Time Calculation (min): 40 min Charges:  OT General Charges $OT Visit: 1 Visit OT Evaluation $OT Eval Moderate Complexity: 1 Mod OT Treatments $Therapeutic Activity: 8-22 mins  Renaye Rakers, OTD, OTR/L SecureChat Preferred Acute  Rehab [phone]   Renaye Rakers [place] 04/23/2022, 11:55 AM

## 2022-04-23 NOTE — Evaluation (Signed)
Physical Therapy Evaluation Patient Details Name: [name] MRN: [id number] DOB: 1970/05/24 Today's Date: 04/23/2022  History of Present Illness  Pt isa 51 y/o F presenting to ED on 12/7 after being found unresponsive by son; admitted for acute hypoxic respiratory failure. PMH includes HLD, HTN, compressive cervical myelomalacia, obesity   Clinical Impression  Pt presents with condition above and deficits mentioned below, see PT Problem List. PTA, she was living with her family in a 2-level house with a level entry. At baseline, pt is able to stand with assistance but has recently been using the hoyer lift to transfer her bed <> chair. Currently, pt demonstrates deficits in activity tolerance, balance, strength, and power along with bil knee pain that impact her functional mobility independence and safety. She required mod-maxAx2 for bed mobility and was unable to stand with TA x2 today. Ultimately had to perform a lateral transition of pt while pt was supine in bed with use of sliding pad and TA x3 to transfer pt to a new bed. Recommend nursing staff use a hoyer lift for transfers. As pt has the level of care she needs at home and this is close to her baseline, recommending she follow-up with HHPT. Will continue to follow acutely.       Recommendations for follow up therapy are one component of a multi-disciplinary discharge planning process, led by the attending physician.  Recommendations may be updated based on patient status, additional functional criteria and insurance authorization.  Follow Up Recommendations Home health PT      Assistance Recommended at Discharge Frequent or constant Supervision/Assistance  Patient can return home with the following  Two people to help with walking and/or transfers;A lot of help with bathing/dressing/bathroom;Assistance with cooking/housework;Assist for transportation    Equipment Recommendations None recommended by PT  Recommendations for Other  Services       Functional Status Assessment Patient has had a recent decline in their functional status and demonstrates the ability to make significant improvements in function in a reasonable and predictable amount of time.     Precautions / Restrictions Precautions Precautions: Fall Restrictions Weight Bearing Restrictions: No      Mobility  Bed Mobility Overal bed mobility: Needs Assistance Bed Mobility: Sidelying to Sit, Sit to Supine, Rolling Rolling: Mod assist Sidelying to sit: Mod assist, Max assist, +2 for physical assistance, HOB elevated   Sit to supine: Mod assist, Max assist, +2 for physical assistance, HOB elevated   General bed mobility comments: mod-maxAx2 for trunk elevation and scooting of hips to EOB and then to lift legs and direct trunk to return to supine. ModA to roll    Transfers Overall transfer level: Needs assistance Equipment used: 2 person hand held assist Transfers: Sit to/from Stand Sit to Stand: Total assist, +2 physical assistance           General transfer comment: pt unable to clear hips, attempted x2 with pt pulling on bed rail of 2nd bed anterior to her and therapists assisting with boost using bed pad under her buttocks    Ambulation/Gait               General Gait Details: unable  Stairs            Wheelchair Mobility    Modified Rankin (Stroke Patients Only)       Balance Overall balance assessment: Needs assistance Sitting-balance support: Feet supported Sitting balance-Leahy Scale: Fair Sitting balance - Comments: static sitting EOB with supervision for safety  Standing balance-Leahy Scale: Zero Standing balance comment: unable to stand with TAx2                             Pertinent Vitals/Pain Pain Assessment Pain Assessment: Faces Faces Pain Scale: Hurts whole lot Pain Location: BLE with donning socks ( per pt has arthritis pain) and standing attempts Pain Descriptors /  Indicators: Discomfort, Grimacing Pain Intervention(s): Limited activity within patient's tolerance, Monitored during session, Repositioned    Home Living Family/patient expects to be discharged to:: Private residence Living Arrangements: Children;Parent Available Help at Discharge: Family;Available 24 hours/day Type of Home: House Home Access: Level entry       Home Layout: Two level;Able to live on main level with bedroom/bathroom Home Equipment: Rollator (4 wheels);Shower seat;Hospital bed;Wheelchair - manual (getting electric "scooter" soon; hoyer lift) Additional Comments: slide board    Prior Function Prior Level of Function : Needs assist             Mobility Comments: can stand with assistance, uses hoyer lift to get to chair, recieves HHPT at home ADLs Comments: assist for ADLs from son, active with HHOT     Hand Dominance   Dominant Hand: Right    Extremity/Trunk Assessment   Upper Extremity Assessment Upper Extremity Assessment: Defer to OT evaluation    Lower Extremity Assessment Lower Extremity Assessment: Generalized weakness (bil knee pain with crepitus noted)    Cervical / Trunk Assessment Cervical / Trunk Assessment: Other exceptions Cervical / Trunk Exceptions: increased body habitus  Communication   Communication: No difficulties  Cognition Arousal/Alertness: Awake/alert Behavior During Therapy: WFL for tasks assessed/performed Overall Cognitive Status: Within Functional Limits for tasks assessed                                          General Comments General comments (skin integrity, edema, etc.): VSS on RA    Exercises     Assessment/Plan    PT Assessment Patient needs continued PT services  PT Problem List Decreased strength;Decreased activity tolerance;Decreased balance;Decreased mobility;Cardiopulmonary status limiting activity;Obesity       PT Treatment Interventions DME instruction;Gait training;Functional  mobility training;Therapeutic activities;Balance training;Therapeutic exercise;Neuromuscular re-education;Patient/family education    PT Goals (Current goals can be found in the Care Plan section)  Acute Rehab PT Goals Patient Stated Goal: to improve PT Goal Formulation: With patient Time For Goal Achievement: 05/07/22 Potential to Achieve Goals: Fair    Frequency Min 2X/week     Co-evaluation PT/OT/SLP Co-Evaluation/Treatment: Yes Reason for Co-Treatment: For patient/therapist safety;To address functional/ADL transfers PT goals addressed during session: Mobility/safety with mobility;Balance OT goals addressed during session: ADL's and self-care       AM-PAC PT "6 Clicks" Mobility  Outcome Measure Help needed turning from your back to your side while in a flat bed without using bedrails?: A Lot Help needed moving from lying on your back to sitting on the side of a flat bed without using bedrails?: Total Help needed moving to and from a bed to a chair (including a wheelchair)?: Total Help needed standing up from a chair using your arms (e.g., wheelchair or bedside chair)?: Total Help needed to walk in hospital room?: Total Help needed climbing 3-5 steps with a railing? : Total 6 Click Score: 7    End of Session Equipment Utilized During Treatment: Gait belt Activity Tolerance:  Patient tolerated treatment well;Patient limited by pain Patient left: in bed;with call bell/phone within reach   PT Visit Diagnosis: Unsteadiness on feet (R26.81);Muscle weakness (generalized) (M62.81);Difficulty in walking, not elsewhere classified (R26.2)    Time: 1460-4799 PT Time Calculation (min) (ACUTE ONLY): 40 min   Charges:   PT Evaluation $PT Eval Moderate Complexity: 1 Mod          Moishe Spice, PT, DPT Acute Rehabilitation Services  Office: [phone]   Orvan Falconer 04/23/2022, 12:49 PM

## 2022-04-23 NOTE — Progress Notes (Signed)
  Progress Note   Patient: [name] NTZ:[id number] DOB: [birth year] DOA: 04/21/2022     1 DOS: the patient was seen and examined on 04/23/2022   Brief hospital course: 51 year old woman PMH including myelomalacia of cervical cord, chronic narcotic use, [name], obesity, presented after being found down, unresponsive with agonal breathing.  Recently treated for chest congestion with amoxicillin.  Admitted for acute hypoxic respiratory failure, sepsis.  Assessment and Plan: Acute hypoxic respiratory failure, sepsis, acute metabolic encephalopathy Secondary to pneumonia Much improved today.  Now off oxygen.  Metabolic encephalopathy resolved.   Bilateral pneumonia, community acquired Continue empiric antibiotics.  Elevated LFTs Abdomen soft.  Check CMP in AM.   Hyperlipidemia Statin on hold as above.   Obesity, Class III,morbid, Body mass index is 53.71 kg/m. May be developing OHS.  Follow-up as an outpatient.   Anxiety and depression Continue Abilify, Wellbutrin, Trileptal, Cymbalta, Xanax as needed   Hyponatremia New, mild, etiology unclear.  Follow-up BMP in AM.   [name] (obstructive sleep apnea) CPAP   Morbid obesity Body mass index is 53.71 kg/m. Follow-up as an outpatient   Compressive cervical cord myelomalacia (HCC) Chronic patient with chronic diminished mobility     Subjective:  Feels better Breathing better  Physical Exam: Vitals:   04/23/22 0407 04/23/22 0813 04/23/22 0820 04/23/22 1149  BP: 129/66 125/62  129/78  Pulse: 76 73  73  Resp: (!) '22 20  18  '$ Temp: 98.7 F (37.1 C) 98.3 F (36.8 C)  98.1 F (36.7 C)  TempSrc: Oral Oral  Oral  SpO2: 98% 90% 99% 93%  Weight:      Height:       Physical Exam Vitals and nursing note reviewed.  Constitutional:      General: She is not in acute distress. Cardiovascular:     Rate and Rhythm: Normal rate and regular rhythm.     Heart sounds: No murmur heard. Pulmonary:     Effort: Pulmonary effort is normal.  No respiratory distress.     Breath sounds: No wheezing, rhonchi or rales.  Neurological:     Mental Status: She is alert.  Psychiatric:        Mood and Affect: Mood normal.        Behavior: Behavior normal.     Data Reviewed: BC pending  Family Communication: none  Disposition: Status is: Inpatient Remains inpatient appropriate because: pneumonia  Planned Discharge Destination: Home    Time spent: 20 minutes  Author: Murray Hodgkins, MD 04/23/2022 3:33 PM  For on call review [email or web address].

## 2022-04-24 LAB — COMPREHENSIVE METABOLIC PANEL
ALT: 52 U/L — ABNORMAL HIGH (ref 0–44)
AST: 45 U/L — ABNORMAL HIGH (ref 15–41)
Albumin: 2.7 g/dL — ABNORMAL LOW (ref 3.5–5.0)
Alkaline Phosphatase: 172 U/L — ABNORMAL HIGH (ref 38–126)
Anion gap: 10 (ref 5–15)
BUN: 10 mg/dL (ref 6–20)
CO2: 25 mmol/L (ref 22–32)
Calcium: 8.2 mg/dL — ABNORMAL LOW (ref 8.9–10.3)
Chloride: 103 mmol/L (ref 98–111)
Creatinine, Ser: 0.56 mg/dL (ref 0.44–1.00)
GFR, Estimated: >60 mL/min (ref >60)
Glucose, Bld: 88 mg/dL (ref 70–99)
Potassium: 4 mmol/L (ref 3.5–5.1)
Sodium: 138 mmol/L (ref 135–145)
Total Bilirubin: 0.8 mg/dL (ref 0.3–1.2)
Total Protein: 6.2 g/dL — ABNORMAL LOW (ref 6.5–8.1)

## 2022-04-24 MED ORDER — AZITHROMYCIN 500 MG PO TABS: 500.0000 mg | ORAL_TABLET | Freq: Every day | ORAL | 0 refills | Status: DC

## 2022-04-24 MED ORDER — ALPRAZOLAM 0.5 MG PO TABS: 0.5000 mg | ORAL_TABLET | Freq: Two times a day (BID) | ORAL | Status: DC | PRN

## 2022-04-24 MED ORDER — AZITHROMYCIN 500 MG PO TABS: 500.0000 mg | ORAL_TABLET | Freq: Every day | ORAL | 0 refills | Status: AC

## 2022-04-24 MED ORDER — AMOXICILLIN 500 MG PO CAPS: 500.0000 mg | ORAL_CAPSULE | Freq: Three times a day (TID) | ORAL | 0 refills | Status: AC

## 2022-04-24 MED ORDER — AMOXICILLIN 500 MG PO CAPS: 500.0000 mg | ORAL_CAPSULE | Freq: Three times a day (TID) | ORAL | 0 refills | Status: DC

## 2022-04-24 MED ORDER — DICLOFENAC SODIUM 1 % EX GEL
4.0000 g | Freq: Four times a day (QID) | CUTANEOUS | Status: DC
  Administered 2022-04-24 (×2): 4 g via TOPICAL
  Filled 2022-04-24: qty 100

## 2022-04-24 NOTE — Discharge Summary (Signed)
Physician Discharge Summary   Patient: [name] MRN: [id number] DOB: [birth year]  Admit date:     04/21/2022  Discharge date: 04/24/22  Discharge Physician: Murray Hodgkins   PCP: [name], MD   Recommendations at discharge:    Bilateral pneumonia, community acquired Complete and empiric antibiotics on discharge.   Elevated LFTs Abdomen soft.  Trending down.  Etiology unclear.  Follow-up as an outpatient.  Statin stopped.    Obesity, Class III,morbid, Body mass index is 53.71 kg/m. May be developing OHS.  Follow-up as an outpatient.   Discharge Diagnoses: Principal Problem:   Acute respiratory failure with hypoxia (HCC) Active Problems:   Compressive cervical cord myelomalacia (HCC)   Hyperlipidemia   Obesity, morbid, BMI 50 or higher (HCC)   Anxiety and depression   Essential hypertension   [name] (obstructive sleep apnea)   Sepsis ([place])   Hyponatremia   CAP (community acquired pneumonia)   Hypokalemia  Resolved Problems:   * No resolved hospital problems. *  Hospital Course: 51 year old woman PMH including myelomalacia of cervical cord, chronic narcotic use, [name], obesity, presented after being found down, unresponsive with agonal breathing.  Recently treated for chest congestion with amoxicillin.  Admitted for acute hypoxic respiratory failure, sepsis, pneumonia.  Rapidly improved, weaned from oxygen.  Hospitalization uncomplicated with standard treatment.  In the visual issues as below.  Acute hypoxic respiratory failure, sepsis, acute metabolic encephalopathy Secondary to pneumonia Resolved with treatment.   Bilateral pneumonia, community acquired Complete and empiric antibiotics on discharge.   Elevated LFTs Abdomen soft.  Trending down.  Etiology unclear.  Follow-up as an outpatient.  Statin stopped.   Hyperlipidemia Statin on hold as above.   Obesity, Class III,morbid, Body mass index is 53.71 kg/m. May be developing OHS.  Follow-up as an  outpatient.   Anxiety and depression Continue Abilify, Wellbutrin, Trileptal, Cymbalta, Xanax as needed   Hyponatremia New, mild, etiology unclear.  Self-limited   [name] (obstructive sleep apnea) CPAP   Morbid obesity Body mass index is 53.71 kg/m. Follow-up as an outpatient   Compressive cervical cord myelomalacia (HCC) Chronic patient with chronic diminished mobility   Mobility Level: 2 Movement:    Disposition  PT: HH OT: HH Pt: HH   GOC General: Full Last iPAL: N/A      Consultants:  None   Procedures performed:  None   Disposition: Home health Diet recommendation:  Regular diet DISCHARGE MEDICATION: Allergies as of 04/24/2022       Reactions   Niacin Anaphylaxis, Shortness Of Breath, Swelling   Swelling and "problems breathing"   Sulfamethoxazole-trimethoprim Anaphylaxis, Swelling, Rash, Other (See Comments)   Throat closed and the eyes became swollen   Aspirin Hives   Bactrim Hives   Benzoin Compound Rash   Cephalexin Hives   Tolerated ceftriaxone and cefepime 07/2019   Iohexol Hives   Pt treated with PO benedryl   Lisinopril Hives   Naproxen Hives   Pnu-imune [pneumococcal Polysaccharide Vaccine] Rash   Sulfonamide Derivatives Rash        Medication List     STOP taking these medications    hydrOXYzine 25 MG capsule Commonly known as: VISTARIL   phentermine 37.5 MG capsule   rosuvastatin 20 MG tablet Commonly known as: CRESTOR       TAKE these medications    ALPRAZolam 0.5 MG tablet Commonly known as: XANAX Take 1 tablet (0.5 mg total) by mouth 2 (two) times daily as needed for anxiety.   amoxicillin 500 MG  capsule Commonly known as: AMOXIL Take 1 capsule (500 mg total) by mouth 3 (three) times daily for 2 days. Start 12/11 in AM   ARIPiprazole 15 MG tablet Commonly known as: ABILIFY TAKE 1 TABLET BY MOUTH EVERY DAY   Armodafinil 250 MG tablet Take 250 mg by mouth in the morning and at bedtime.   azithromycin 500  MG tablet Commonly known as: Zithromax Take 1 tablet (500 mg total) by mouth at bedtime for 3 days. Take 1 tablet daily for 3 days.   Biotin 10 MG Tabs Take 10 mg by mouth in the morning.   buPROPion 300 MG 24 hr tablet Commonly known as: WELLBUTRIN XL TAKE 1 TABLET BY MOUTH EVERY DAY   calcium citrate 950 (200 Ca) MG tablet Commonly known as: CALCITRATE - dosed in mg elemental calcium Take 1 tablet by mouth daily.   clonazePAM 1 MG tablet Commonly known as: KLONOPIN TAKE 1 TABLET BY MOUTH AT BEDTIME   cyanocobalamin 1000 MCG tablet Commonly known as: VITAMIN B12 TAKE 1 TABLET BY MOUTH EVERY DAY   cyclobenzaprine 10 MG tablet Commonly known as: FLEXERIL TAKE 2 TABLETS BY MOUTH 2 TIMES DAILY   diclofenac Sodium 1 % Gel Commonly known as: VOLTAREN Apply 4 g topically 4 (four) times daily.   diphenhydrAMINE 25 MG tablet Commonly known as: BENADRYL Take 25 mg by mouth 2 (two) times daily as needed for allergies.   DULoxetine 60 MG capsule Commonly known as: CYMBALTA Take 2 capsules (120 mg total) by mouth daily. What changed:  how much to take when to take this   FeroSul 325 (65 FE) MG tablet Generic drug: ferrous sulfate TAKE 1 TABLET BY MOUTH EVERY DAY WITH BREAKFAST What changed: See the new instructions.   folic acid 1 MG tablet Commonly known as: FOLVITE TAKE 1 TABLET BY MOUTH EVERY DAY   gabapentin 600 MG tablet Commonly known as: NEURONTIN Take 1 tablet (600 mg total) by mouth 3 (three) times daily. TAKE 2 TABLETS BY MOUTH 3 TIMES DAILY What changed: additional instructions   GNP Vitamin B-1 100 MG tablet Generic drug: thiamine TAKE 1 TABLET BY MOUTH EVERY DAY   HYDROcodone-acetaminophen 10-325 MG tablet Commonly known as: NORCO Take 1 tablet by mouth every 8 (eight) hours as needed for up to 90 doses.   nebivolol 10 MG tablet Commonly known as: BYSTOLIC TAKE 1 TABLET BY MOUTH EVERY DAY   nitrofurantoin (macrocrystal-monohydrate) 100 MG  capsule Commonly known as: Macrobid Take 1 capsule (100 mg total) by mouth 2 (two) times daily for 7 days.   nystatin powder Commonly known as: MYCOSTATIN/NYSTOP APPLY 1 APPLICATION TOPICALLY 3 TIMES DAILY   Oxcarbazepine 300 MG tablet Commonly known as: TRILEPTAL Take 300 mg by mouth 2 (two) times daily.   pantoprazole 40 MG tablet Commonly known as: PROTONIX TAKE 1 TABLET BY MOUTH 2 TIMES DAILY   promethazine 25 MG tablet Commonly known as: PHENERGAN TAKE 1 TABLET BY MOUTH EVERY 6 HOURS AS NEEDED FOR NAUSEA AND VOMITING What changed: See the new instructions.   pyridOXINE 100 MG tablet Commonly known as: VITAMIN B6 TAKE 1 TABLET BY MOUTH EVERY DAY   Skyrizi 150 MG/ML Sosy Generic drug: Risankizumab-rzaa Inject 150 mg into the skin every 3 (three) months.       Feels better  Follow-up Information     [name], MD. Schedule an appointment as soon as possible for a visit in 1 week(s).   Specialty: Family Medicine Contact information: [phone] [place] [address] [phone]         [facility] [place] Follow up.   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 Contact information: [address] [place] [address] [phone]                Discharge Exam: Danley Danker Weights   04/21/22 1828  Weight: 124.7 kg   Physical Exam Vitals reviewed.  Constitutional:      General: She is not in acute distress.    Appearance: She is not ill-appearing or toxic-appearing.  Cardiovascular:     Rate and Rhythm: Normal rate and regular rhythm.     Heart sounds: No murmur heard. Pulmonary:     Effort: Pulmonary effort is normal. No respiratory distress.     Breath sounds: No wheezing, rhonchi or rales.  Neurological:     Mental Status: She is alert.  Psychiatric:        Mood and Affect: Mood normal.        Behavior: Behavior normal.       Condition at discharge: good  The results of significant diagnostics from this hospitalization (including imaging, microbiology, ancillary and laboratory) are listed below for reference.   Imaging Studies: ECHOCARDIOGRAM COMPLETE  Result Date: 04/22/2022    ECHOCARDIOGRAM REPORT   Patient Name:   CAMYLA CAMPOSANO Zuba Date of Exam: 04/22/2022 Medical Rec #:  [id number]    Height:       60.0 in Accession #:    [id number]   Weight:       275.0 lb Date of Birth:  09/22/1970    BSA:          2.137 m Patient Age:    78 years     BP:           110/64 mmHg Patient Gender: F            HR:           77 bpm. Exam Location:  Inpatient Procedure: 2D Echo, Color Doppler, Cardiac Doppler and Intracardiac            Opacification Agent Indications:    Respiratory failure  History:        Patient has prior history of Echocardiogram examinations.                 Signs/Symptoms:Shortness of Breath; Risk Factors:Dyslipidemia,                 Hypertension and Diabetes.  Sonographer:    Melissa Morford RDCS (AE, PE) Referring Phys: [id number] ANASTASSIA DOUTOVA  Sonographer Comments: Technically difficult study due to poor echo windows, suboptimal apical window, suboptimal subcostal window and patient is obese. Image acquisition challenging due to patient body habitus, Image acquisition challenging due to respiratory motion and pt unable to lay on left side. IMPRESSIONS  1. Left ventricular ejection fraction, by estimation, is 55 to 60%. The left ventricle has normal function. The left ventricle has no regional wall motion abnormalities. Left ventricular diastolic parameters are indeterminate.  2. Right ventricular systolic function is normal. The right ventricular size is normal.  3. The mitral valve is normal in structure. No evidence of mitral valve regurgitation. No evidence of mitral stenosis.  4. The aortic valve is normal in structure. Aortic valve regurgitation is not visualized. No aortic stenosis is present.  5. The inferior  vena cava is normal in size with greater  than 50% respiratory variability, suggesting right atrial pressure of 3 mmHg. FINDINGS  Left Ventricle: Left ventricular ejection fraction, by estimation, is 55 to 60%. The left ventricle has normal function. The left ventricle has no regional wall motion abnormalities. Definity contrast agent was given IV to delineate the left ventricular  endocardial borders. The left ventricular internal cavity size was normal in size. There is no left ventricular hypertrophy. Left ventricular diastolic parameters are indeterminate. Right Ventricle: The right ventricular size is normal. No increase in right ventricular wall thickness. Right ventricular systolic function is normal. Left Atrium: Left atrial size was normal in size. Right Atrium: Right atrial size was normal in size. Pericardium: There is no evidence of pericardial effusion. Mitral Valve: The mitral valve is normal in structure. No evidence of mitral valve regurgitation. No evidence of mitral valve stenosis. Tricuspid Valve: The tricuspid valve is normal in structure. Tricuspid valve regurgitation is not demonstrated. No evidence of tricuspid stenosis. Aortic Valve: The aortic valve is normal in structure. Aortic valve regurgitation is not visualized. No aortic stenosis is present. Pulmonic Valve: The pulmonic valve was normal in structure. Pulmonic valve regurgitation is not visualized. No evidence of pulmonic stenosis. Aorta: The aortic root is normal in size and structure. Venous: The inferior vena cava is normal in size with greater than 50% respiratory variability, suggesting right atrial pressure of 3 mmHg. IAS/Shunts: No atrial level shunt detected by color flow Doppler.  LEFT VENTRICLE PLAX 2D LVIDd:         5.20 cm   Diastology LVIDs:         3.40 cm   LV e' medial:    7.83 cm/s LV PW:         1.00 cm   LV E/e' medial:  12.1 LV IVS:        1.00 cm   LV e' lateral:   8.70 cm/s LVOT diam:     2.10 cm   LV E/e' lateral:  10.9 LV SV:         82 LV SV Index:   39 LVOT Area:     3.46 cm  RIGHT VENTRICLE RV S prime:     11.40 cm/s LEFT ATRIUM             Index        RIGHT ATRIUM           Index LA diam:        4.50 cm 2.11 cm/m   RA Area:     27.40 cm LA Vol (A2C):   46.1 ml 21.57 ml/m  RA Volume:   98.60 ml  46.14 ml/m LA Vol (A4C):   75.6 ml 35.37 ml/m LA Biplane Vol: 60.2 ml 28.17 ml/m  AORTIC VALVE LVOT Vmax:   117.00 cm/s LVOT Vmean:  84.300 cm/s LVOT VTI:    0.238 m  AORTA Ao Root diam: 2.90 cm MITRAL VALVE MV Area (PHT): 5.38 cm    SHUNTS MV Decel Time: 141 msec    Systemic VTI:  0.24 m MV E velocity: 95.10 cm/s  Systemic Diam: 2.10 cm MV A velocity: 62.10 cm/s MV E/A ratio:  1.53 Kardie Tobb DO Electronically signed by Berniece Salines DO Signature Date/Time: 04/22/2022/2:36:49 PM    Final    CT ANGIO CHEST PE W OR WO CONTRAST  Result Date: 04/21/2022 CLINICAL DATA:  Pulmonary embolism suspected, high probability. Shortness of breath. EXAM: CT ANGIOGRAPHY CHEST WITH CONTRAST TECHNIQUE: Multidetector CT imaging of the chest was performed  using the standard protocol during bolus administration of intravenous contrast. Multiplanar CT image reconstructions and MIPs were obtained to evaluate the vascular anatomy. RADIATION DOSE REDUCTION: This exam was performed according to the departmental dose-optimization program which includes automated exposure control, adjustment of the mA and/or kV according to patient size and/or use of iterative reconstruction technique. CONTRAST:  4m OMNIPAQUE IOHEXOL 350 MG/ML SOLN COMPARISON:  08/12/2021. FINDINGS: Cardiovascular: Heart is enlarged and there is no pericardial effusion. The aorta and pulmonary trunk are normal in caliber. No large central pulmonary embolism is identified. Evaluation of the pulmonary arteries is limited due to suboptimal opacification, patient's body habitus, and respiratory motion artifact. Mediastinum/Nodes: No enlarged mediastinal, hilar, or axillary lymph  nodes. Thyroid gland, trachea, and esophagus demonstrate no significant findings. Lungs/Pleura: Hazy ground-glass opacities are present in the lungs bilaterally. Atelectasis is present at the lung bases. No effusion or pneumothorax. Upper Abdomen: Gastric surgery changes are noted. No acute abnormality. Musculoskeletal: Degenerative changes in the thoracic spine. Stable compression deformities are present at T6 and T12. No acute osseous abnormality. Review of the MIP images confirms the above findings. IMPRESSION: 1. No large central pulmonary embolus. Evaluation of the distal pulmonary arteries is limited due to patient's body habitus, suboptimal opacification, and respiratory motion artifact. 2. Hazy ground-glass opacities in the lungs bilaterally, possible edema or infiltrate. 3. Cardiomegaly. Electronically Signed   By: LBrett FairyM.D.   On: 04/21/2022 21:18   DG Chest Port 1 View  Result Date: 04/21/2022 CLINICAL DATA:  Shortness of breath EXAM: PORTABLE CHEST 1 VIEW COMPARISON:  08/12/2021 FINDINGS: Low lung volumes. Cardiomegaly with vascular congestion. Right base opacity, favor atelectasis. No effusions or pneumothorax. No acute bony abnormality. IMPRESSION: Low lung volumes with right base atelectasis. Cardiomegaly, vascular congestion. Electronically Signed   By: KRolm BaptiseM.D.   On: 04/21/2022 19:36    Microbiology: Results for orders placed or performed during the hospital encounter of 04/21/22  Resp Panel by RT-PCR (Flu A&B, Covid) Anterior Nasal Swab     Status: None   Collection Time: 04/21/22  6:46 PM   Specimen: Anterior Nasal Swab  Result Value Ref Range Status   SARS Coronavirus 2 by RT PCR NEGATIVE NEGATIVE Final    Comment: (NOTE) SARS-CoV-2 target nucleic acids are NOT DETECTED.  The SARS-CoV-2 RNA is generally detectable in upper respiratory specimens during the acute phase of infection. The lowest concentration of SARS-CoV-2 viral copies this assay can detect is 138  copies/mL. A negative result does not preclude SARS-Cov-2 infection and should not be used as the sole basis for treatment or other patient management decisions. A negative result may occur with  improper specimen collection/handling, submission of specimen other than nasopharyngeal swab, presence of viral mutation(s) within the areas targeted by this assay, and inadequate number of viral copies(<138 copies/mL). A negative result must be combined with clinical observations, patient history, and epidemiological information. The expected result is Negative.  Fact Sheet for Patients:  hEntrepreneurPulse.com.au Fact Sheet for Healthcare Providers:  hIncredibleEmployment.be This test is no t yet approved or cleared by the [place] and  has been authorized for detection and/or diagnosis of SARS-CoV-2 by FDA under an Emergency Use Authorization (EUA). This EUA will remain  in effect (meaning this test can be used) for the duration of the COVID-19 declaration under Section 564(b)(1) of the Act, 21 U.S.C.section 360bbb-3(b)(1), unless the authorization is terminated  or revoked sooner.       Influenza A by PCR  NEGATIVE NEGATIVE Final   Influenza B by PCR NEGATIVE NEGATIVE Final    Comment: (NOTE) The Xpert Xpress SARS-CoV-2/FLU/RSV plus assay is intended as an aid in the diagnosis of influenza from Nasopharyngeal swab specimens and should not be used as a sole basis for treatment. Nasal washings and aspirates are unacceptable for Xpert Xpress SARS-CoV-2/FLU/RSV testing.  Fact Sheet for Patients: EntrepreneurPulse.com.au  Fact Sheet for Healthcare Providers: IncredibleEmployment.be  This test is not yet approved or cleared by the [place] FDA and has been authorized for detection and/or diagnosis of SARS-CoV-2 by FDA under an Emergency Use Authorization (EUA). This EUA will remain in effect (meaning  this test can be used) for the duration of the COVID-19 declaration under Section 564(b)(1) of the Act, 21 U.S.C. section 360bbb-3(b)(1), unless the authorization is terminated or revoked.  Performed at [facility], [place] [address], [place], [place] [address]   Respiratory (~20 pathogens) panel by PCR     Status: None   Collection Time: 04/21/22  6:46 PM   Specimen: Nasopharyngeal Swab; Respiratory  Result Value Ref Range Status   Adenovirus NOT DETECTED NOT DETECTED Final   Coronavirus 229E NOT DETECTED NOT DETECTED Final    Comment: (NOTE) The Coronavirus on the Respiratory Panel, DOES NOT test for the novel  Coronavirus (2019 nCoV)    Coronavirus HKU1 NOT DETECTED NOT DETECTED Final   Coronavirus NL63 NOT DETECTED NOT DETECTED Final   Coronavirus OC43 NOT DETECTED NOT DETECTED Final   Metapneumovirus NOT DETECTED NOT DETECTED Final   Rhinovirus / Enterovirus NOT DETECTED NOT DETECTED Final   Influenza A NOT DETECTED NOT DETECTED Final   Influenza B NOT DETECTED NOT DETECTED Final   Parainfluenza Virus 1 NOT DETECTED NOT DETECTED Final   Parainfluenza Virus 2 NOT DETECTED NOT DETECTED Final   Parainfluenza Virus 3 NOT DETECTED NOT DETECTED Final   Parainfluenza Virus 4 NOT DETECTED NOT DETECTED Final   Respiratory Syncytial Virus NOT DETECTED NOT DETECTED Final   Bordetella pertussis NOT DETECTED NOT DETECTED Final   Bordetella Parapertussis NOT DETECTED NOT DETECTED Final   Chlamydophila pneumoniae NOT DETECTED NOT DETECTED Final   Mycoplasma pneumoniae NOT DETECTED NOT DETECTED Final    Comment: Performed at [facility], [place], [place], [place] [address]  Culture, blood (x 2)     Status: None (Preliminary result)   Collection Time: 04/22/22  5:13 AM   Specimen: BLOOD RIGHT HAND  Result Value Ref Range Status   Specimen Description BLOOD RIGHT HAND  Final   Special Requests   Final    BOTTLES DRAWN AEROBIC AND ANAEROBIC Blood Culture results may not  be optimal due to an inadequate volume of blood received in culture bottles   Culture   Final    NO GROWTH 2 DAYS Performed at [facility], [place] [address], [place], [place] [address]    Report Status PENDING  Incomplete  Urine Culture     Status: Abnormal   Collection Time: 04/22/22  5:44 AM   Specimen: Urine, Clean Catch  Result Value Ref Range Status   Specimen Description URINE, CLEAN CATCH  Final   Special Requests   Final    NONE Performed at [facility], [place] [address], [place], [place] [address]    Culture MULTIPLE SPECIES PRESENT, SUGGEST RECOLLECTION (A)  Final   Report Status 04/23/2022 FINAL  Final  Culture, blood (x 2)     Status: None (Preliminary result)  Collection Time: 04/23/22 12:21 AM   Specimen: BLOOD  Result Value Ref Range Status   Specimen Description BLOOD SITE NOT SPECIFIED  Final   Special Requests   Final    BOTTLES DRAWN AEROBIC ONLY Blood Culture adequate volume   Culture   Final    NO GROWTH 1 DAY Performed at [facility], [place] [address] [place], [place] [address]    Report Status PENDING  Incomplete   *Note: Due to a large number of results and/or encounters for the requested time period, some results have not been displayed. A complete set of results can be found in Results Review.    Labs: CBC: Recent Labs  Lab 04/21/22 1846 04/21/22 1857 04/21/22 1859 04/21/22 2353 04/22/22 0513  WBC 11.3*  --   --   --  12.8*  HGB 12.8 14.3 14.3 13.6 12.4  HCT 40.8 42.0 42.0 40.0 40.9  MCV 94.9  --   --   --  96.5  PLT 177  --   --   --  009   Basic Metabolic Panel: Recent Labs  Lab 04/21/22 1846 04/21/22 1857 04/21/22 1859 04/21/22 2353 04/22/22 0513 04/24/22 0135  NA 129* 133* 133* 131* 130* 138  K 3.3* 3.3* 3.3* 3.9 4.3 4.0  CL 93* 93*  --   --  95* 103  CO2 23  --   --   --  21* 25  GLUCOSE 92 90  --   --  134* 88  BUN 14 17  --   --  17 10  CREATININE 1.03* 1.10*  --   --  0.88 0.56  CALCIUM 7.8*  --   --    --  8.0* 8.2*  MG  --   --   --   --  1.9  --   PHOS  --   --   --   --  4.8*  --    Liver Function Tests: Recent Labs  Lab 04/21/22 1846 04/22/22 0513 04/24/22 0135  AST 165* 126* 45*  ALT 117* 106* 52*  ALKPHOS 196* 187* 172*  BILITOT 2.1* 1.6* 0.8  PROT 6.4* 6.6 6.2*  ALBUMIN 2.8* 3.1* 2.7*   CBG: Recent Labs  Lab 04/22/22 0027  GLUCAP 128*    Discharge time spent: less than 30 minutes.  Signed: Murray Hodgkins, MD [facility] 04/24/2022

## 2022-04-24 NOTE — TOC Initial Note (Addendum)
Transition of Care [facility]) - Initial/Assessment Note    Patient Details  Name: [name] MRN: [id number] Date of Birth: November 10, 1970  [facility]) CM/SW Contact:    Bethena Roys, RN Phone Number: 04/24/2022, 11:12 AM  Clinical Narrative: Risk for readmission assessment completed. PTA patient was from home with family support. Patient is currently active with [facility] for PT/OT and patient wants RN added. MD is aware that orders are needed. Patient has asked for Rx to be sent to [facility] and family can pick them up. Patient will need PTAR home. No further needs identified at this time.   1144 04-24-22 Case Manager has scheduled PTAR for 1300. No further needs identified from Case Manager at this times.                   Expected Discharge Plan: [place] Barriers to Discharge: No Barriers Identified   Patient Goals and CMS Choice Patient states their goals for this hospitalization and ongoing recovery are:: to return home.   Choice offered to / list presented to : NA (Patient is active with [facility])  Expected Discharge Plan and Services Expected Discharge Plan: [place] In-house Referral: NA Discharge Planning Services: CM Consult Post Acute Care Choice: [place], Resumption of Svcs/PTA Provider Living arrangements for the past 2 months: Single Family Home Expected Discharge Date: 04/24/22                 DME Agency: NA       HH Arranged: RN, Disease Management, PT, OT [place] Agency: [place] (Bonnie) Date [place]: 04/24/22 Time [place]: 1112 Representative spoke with at [place]: [place] Arrangements/Services Living arrangements for the past 2 months: [place] with:: Relatives Patient language and need for interpreter reviewed:: Yes Do you feel safe going back to the place where you live?: Yes      Need for Family  Participation in Patient Care: Yes (Comment) Care giver support system in place?: Yes (comment) Current home services: DME Criminal Activity/Legal Involvement Pertinent to Current Situation/Hospitalization: No - Comment as needed  Activities of Daily Living Home Assistive Devices/Equipment: Bathtub lift, Blood pressure cuff, Cane (specify quad or straight), CBG Meter, Eyeglasses, Grab bars around toilet, Hospital bed, Hoyer Lift ADL Screening (condition at time of admission) Patient's cognitive ability adequate to safely complete daily activities?: Yes Is the patient deaf or have difficulty hearing?: No Does the patient have difficulty seeing, even when wearing glasses/contacts?: No Does the patient have difficulty concentrating, remembering, or making decisions?: No Patient able to express need for assistance with ADLs?: Yes Does the patient have difficulty dressing or bathing?: Yes Independently performs ADLs?: Yes (appropriate for developmental age) Does the patient have difficulty walking or climbing stairs?: Yes Weakness of Legs: Both Weakness of Arms/Hands: None  Permission Sought/Granted Permission sought to share information with : Family Supports, Customer service manager, Case Optician, dispensing granted to share information with : Yes, Verbal Permission Granted     Permission granted to share info w AGENCY: [facility]        Emotional Assessment Appearance:: Appears stated age Attitude/Demeanor/Rapport: Engaged Affect (typically observed): Appropriate Orientation: : Oriented to Situation, Oriented to  Time, Oriented to Place, Oriented to Self Alcohol / Substance Use: Not Applicable Psych Involvement: No (comment)  Admission diagnosis:  Acute respiratory failure ([place]) [J96.00] Hypoxia [R09.02] Acute respiratory failure with hypoxemia ([place]) [  J96.01] Patient Active Problem List   Diagnosis Date Noted   Acute respiratory failure with hypoxia ([place]) 04/22/2022    Hypokalemia 04/22/2022   Leg cramps 01/12/2022   Positive blood cultures 08/14/2021   CAP (community acquired pneumonia) 08/12/2021   Fever of unknown origin 08/12/2021   Sacral pressure sore 07/06/2021   Vitamin B6 deficiency 06/08/2021   Myelomalacia of cervical cord ([place]) 06/06/2021   Compressive cervical cord myelomalacia ([place]) 06/05/2021   B12 deficiency 06/05/2021   Folate deficiency 06/05/2021   Vitamin D deficiency 06/05/2021   Leg weakness, bilateral 06/04/2021   Cervical atypia 01/06/2021   Steatosis of liver 01/06/2021   Hyponatremia 07/27/2019   Sepsis ([place]) 07/26/2019   Urinary urgency 04/04/2018   Chronic narcotic use 09/07/2016   Acute blood loss anemia 01/14/2016   Multiple gastric ulcers    PAN (polyarteritis nodosa) ([place]) 11/24/2015   GERD (gastroesophageal reflux disease) 01/08/2014   Routine general medical examination at a health care facility 04/23/2013   Bariatric surgery status 10/31/2012   S/P skin biopsy 10/31/2012   Psoriasis 01/09/2012   DDD (degenerative disc disease), lumbar 11/30/2011   Migraine headache    [name] (obstructive sleep apnea) 11/16/2010   HYPERGLYCEMIA, FASTING 10/08/2009   CONTRACTURE OF TENDON 08/17/2009   PARESTHESIA, HANDS 12/23/2008   Leg pain, right 11/05/2008   ADVERSE DRUG REACTION 04/03/2008   PERIPHERAL EDEMA 03/26/2008   Obesity, morbid, BMI 50 or higher ([place]) 01/03/2007   Cellulitis 01/03/2007   Hyperlipidemia 09/22/2006   Fibromyalgia 09/20/2006   Anxiety and depression 06/28/2006   Essential hypertension 06/28/2006   PCP:  [name], MD Pharmacy:   [facility] [place], [place] - [address] [place] [address] Phone: [phone] Fax: [phone]   Readmission Risk Interventions    04/24/2022   11:10 AM  Readmission Risk Prevention Plan  Transportation Screening Complete  HRI or Home Care Consult Complete  Social Work Consult for [place]  Planning/Counseling Complete  Palliative Care Screening Not Applicable  Medication Review (RN Care Manager) Referral to Pharmacy

## 2022-04-25 ENCOUNTER — Telehealth: Payer: Self-pay | Admitting: Family Medicine

## 2022-04-25 ENCOUNTER — Telehealth: Payer: Self-pay

## 2022-04-25 DIAGNOSIS — M5136 Other intervertebral disc degeneration, lumbar region: Secondary | ICD-10-CM | POA: Diagnosis not present

## 2022-04-25 DIAGNOSIS — G9529 Other cord compression: Secondary | ICD-10-CM | POA: Diagnosis not present

## 2022-04-25 DIAGNOSIS — G4733 Obstructive sleep apnea (adult) (pediatric): Secondary | ICD-10-CM | POA: Diagnosis not present

## 2022-04-25 DIAGNOSIS — I872 Venous insufficiency (chronic) (peripheral): Secondary | ICD-10-CM | POA: Diagnosis not present

## 2022-04-25 DIAGNOSIS — E785 Hyperlipidemia, unspecified: Secondary | ICD-10-CM | POA: Diagnosis not present

## 2022-04-25 DIAGNOSIS — Z9181 History of falling: Secondary | ICD-10-CM | POA: Diagnosis not present

## 2022-04-25 DIAGNOSIS — Z79899 Other long term (current) drug therapy: Secondary | ICD-10-CM | POA: Diagnosis not present

## 2022-04-25 DIAGNOSIS — M4802 Spinal stenosis, cervical region: Secondary | ICD-10-CM | POA: Diagnosis not present

## 2022-04-25 DIAGNOSIS — K760 Fatty (change of) liver, not elsewhere classified: Secondary | ICD-10-CM | POA: Diagnosis not present

## 2022-04-25 DIAGNOSIS — E538 Deficiency of other specified B group vitamins: Secondary | ICD-10-CM | POA: Diagnosis not present

## 2022-04-25 DIAGNOSIS — L409 Psoriasis, unspecified: Secondary | ICD-10-CM | POA: Diagnosis not present

## 2022-04-25 DIAGNOSIS — E559 Vitamin D deficiency, unspecified: Secondary | ICD-10-CM | POA: Diagnosis not present

## 2022-04-25 DIAGNOSIS — K219 Gastro-esophageal reflux disease without esophagitis: Secondary | ICD-10-CM | POA: Diagnosis not present

## 2022-04-25 DIAGNOSIS — I10 Essential (primary) hypertension: Secondary | ICD-10-CM | POA: Diagnosis not present

## 2022-04-25 DIAGNOSIS — Z87440 Personal history of urinary (tract) infections: Secondary | ICD-10-CM | POA: Diagnosis not present

## 2022-04-25 DIAGNOSIS — M300 Polyarteritis nodosa: Secondary | ICD-10-CM | POA: Diagnosis not present

## 2022-04-25 DIAGNOSIS — F32A Depression, unspecified: Secondary | ICD-10-CM | POA: Diagnosis not present

## 2022-04-25 DIAGNOSIS — M797 Fibromyalgia: Secondary | ICD-10-CM | POA: Diagnosis not present

## 2022-04-25 DIAGNOSIS — Z993 Dependence on wheelchair: Secondary | ICD-10-CM | POA: Diagnosis not present

## 2022-04-25 DIAGNOSIS — Z79891 Long term (current) use of opiate analgesic: Secondary | ICD-10-CM | POA: Diagnosis not present

## 2022-04-25 DIAGNOSIS — G629 Polyneuropathy, unspecified: Secondary | ICD-10-CM | POA: Diagnosis not present

## 2022-04-25 DIAGNOSIS — G9589 Other specified diseases of spinal cord: Secondary | ICD-10-CM | POA: Diagnosis not present

## 2022-04-25 DIAGNOSIS — G43909 Migraine, unspecified, not intractable, without status migrainosus: Secondary | ICD-10-CM | POA: Diagnosis not present

## 2022-04-25 NOTE — Telephone Encounter (Signed)
Received for from [facility]. Placed in front bin

## 2022-04-25 NOTE — Patient Outreach (Addendum)
  Care Coordination TOC Note Transition Care Management Follow-up Telephone Call Date of discharge and from where: 12/10/23[facility]  Dx: "hypoxia" How have you been since you were released from the hospital? Patient states she is doing much better. She has not had any SOB or resp problem since returning home. She still has a slight productive cough but phlegm is clear. Discussed s/s of resp infections and things to monitor for. Patient voiced understanding. Appetite reported as good. No issues with going to the bathroom. She reports she has been ambulating and getting around okay.  Any questions or concerns? Yes-Patient reports she was not able to get abxs picked up from pharmacy until today so she missed her dose evening yesterday and wanting to know what to do. Reviewed abx instructions-advised patient to continue taking until completed and that she would be off schedule by a day but otherwise should be fine. Also, discussed importance of taking meds with foods to to avoid GI upset. She voiced understanding.   Items Reviewed: Did the pt receive and understand the discharge instructions provided? Yes  Medications obtained and verified? Yes  Other? Yes  Any new allergies since your discharge? No  Dietary orders reviewed? Yes Do you have support at home? Yes   Home Care and Equipment/Supplies: Were home health services ordered? yes If so, what is the name of the agency? [facility]  Has the agency set up a time to come to the patient's home? yes Were any new equipment or medical supplies ordered?  No What is the name of the medical supply agency? N/A Were you able to get the supplies/equipment? not applicable Do you have any questions related to the use of the equipment or supplies? No  Functional Questionnaire: (I = Independent and D = Dependent) ADLs: I  Bathing/Dressing- I  Meal Prep- I  Eating- I  Maintaining continence- I  Transferring/Ambulation- I  Managing Meds-  I  Follow up appointments reviewed:  PCP Hospital f/u appt confirmed?  Patient reports that [facility] staff called office today for her to assist with scheduling appt. She is awaiting office to return call.  Marland Kitchen [facility] f/u appt confirmed?  N/A . Are transportation arrangements needed? No  If their condition worsens, is the pt aware to call PCP or go to the Emergency Dept.? Yes Was the patient provided with contact information for the PCP's office or ED? Yes Was to pt encouraged to call back with questions or concerns? Yes  SDOH assessments and interventions completed:   Yes SDOH Interventions Today    Flowsheet Row Most Recent Value  SDOH Interventions   Food Insecurity Interventions Intervention Not Indicated  Transportation Interventions Intervention Not Indicated       Care Coordination Interventions:  Education provided    Encounter Outcome:  Pt. Visit Completed    Enzo Montgomery, RN,BSN,CCM [place] Management Telephonic Care Management Coordinator Direct Phone: [phone] Toll Free: [phone] Fax: [phone]

## 2022-04-26 ENCOUNTER — Telehealth: Payer: Self-pay | Admitting: Family Medicine

## 2022-04-26 DIAGNOSIS — Z79899 Other long term (current) drug therapy: Secondary | ICD-10-CM | POA: Diagnosis not present

## 2022-04-26 DIAGNOSIS — I872 Venous insufficiency (chronic) (peripheral): Secondary | ICD-10-CM | POA: Diagnosis not present

## 2022-04-26 DIAGNOSIS — G9529 Other cord compression: Secondary | ICD-10-CM | POA: Diagnosis not present

## 2022-04-26 DIAGNOSIS — L409 Psoriasis, unspecified: Secondary | ICD-10-CM | POA: Diagnosis not present

## 2022-04-26 DIAGNOSIS — Z79891 Long term (current) use of opiate analgesic: Secondary | ICD-10-CM | POA: Diagnosis not present

## 2022-04-26 DIAGNOSIS — E559 Vitamin D deficiency, unspecified: Secondary | ICD-10-CM | POA: Diagnosis not present

## 2022-04-26 DIAGNOSIS — E538 Deficiency of other specified B group vitamins: Secondary | ICD-10-CM | POA: Diagnosis not present

## 2022-04-26 DIAGNOSIS — E785 Hyperlipidemia, unspecified: Secondary | ICD-10-CM | POA: Diagnosis not present

## 2022-04-26 DIAGNOSIS — M5136 Other intervertebral disc degeneration, lumbar region: Secondary | ICD-10-CM | POA: Diagnosis not present

## 2022-04-26 DIAGNOSIS — Z9181 History of falling: Secondary | ICD-10-CM | POA: Diagnosis not present

## 2022-04-26 DIAGNOSIS — Z87440 Personal history of urinary (tract) infections: Secondary | ICD-10-CM | POA: Diagnosis not present

## 2022-04-26 DIAGNOSIS — M797 Fibromyalgia: Secondary | ICD-10-CM | POA: Diagnosis not present

## 2022-04-26 DIAGNOSIS — G43909 Migraine, unspecified, not intractable, without status migrainosus: Secondary | ICD-10-CM | POA: Diagnosis not present

## 2022-04-26 DIAGNOSIS — G629 Polyneuropathy, unspecified: Secondary | ICD-10-CM | POA: Diagnosis not present

## 2022-04-26 DIAGNOSIS — G4733 Obstructive sleep apnea (adult) (pediatric): Secondary | ICD-10-CM | POA: Diagnosis not present

## 2022-04-26 DIAGNOSIS — G9589 Other specified diseases of spinal cord: Secondary | ICD-10-CM | POA: Diagnosis not present

## 2022-04-26 DIAGNOSIS — F32A Depression, unspecified: Secondary | ICD-10-CM | POA: Diagnosis not present

## 2022-04-26 DIAGNOSIS — M4802 Spinal stenosis, cervical region: Secondary | ICD-10-CM | POA: Diagnosis not present

## 2022-04-26 DIAGNOSIS — Z993 Dependence on wheelchair: Secondary | ICD-10-CM | POA: Diagnosis not present

## 2022-04-26 DIAGNOSIS — K760 Fatty (change of) liver, not elsewhere classified: Secondary | ICD-10-CM | POA: Diagnosis not present

## 2022-04-26 DIAGNOSIS — M300 Polyarteritis nodosa: Secondary | ICD-10-CM | POA: Diagnosis not present

## 2022-04-26 DIAGNOSIS — I10 Essential (primary) hypertension: Secondary | ICD-10-CM | POA: Diagnosis not present

## 2022-04-26 DIAGNOSIS — K219 Gastro-esophageal reflux disease without esophagitis: Secondary | ICD-10-CM | POA: Diagnosis not present

## 2022-04-26 LAB — LEGIONELLA PNEUMOPHILA SEROGP 1 UR AG: L. pneumophila Serogp 1 Ur Ag: NEGATIVE

## 2022-04-26 NOTE — Telephone Encounter (Signed)
[name] from [place] for verbal orders which I provided her with and also asked if Gianella can do a virtual visit for hospital follow up since transportation is a big issue. I let her know that it was very important for Dr Birdie Riddle to actually see her in person she hasn't been seen in office since August. [name] understood and stated she would inform Ludie.

## 2022-04-26 NOTE — Telephone Encounter (Signed)
Form placed in Dr Birdie Riddle to be reviewed folder no place for physician signature

## 2022-04-27 ENCOUNTER — Telehealth: Payer: Self-pay

## 2022-04-27 DIAGNOSIS — M300 Polyarteritis nodosa: Principal | ICD-10-CM

## 2022-04-27 LAB — CULTURE, BLOOD (ROUTINE X 2): Culture: NO GROWTH

## 2022-04-27 MED ORDER — GABAPENTIN 600 MG TABLET
ORAL_TABLET | Freq: Three times a day (TID) | ORAL | 0 refills | 90 days | Status: CP
Start: 2022-04-27 — End: ?

## 2022-04-27 NOTE — Unmapped (Signed)
Gabapentin refill  Last Visit Date: 03/11/2021  Next Visit Date: 07/14/2022

## 2022-04-27 NOTE — Telephone Encounter (Signed)
Received form from [facility] home health that has requested signature and return, placed in the front pick up file

## 2022-04-27 NOTE — Telephone Encounter (Signed)
Placed forms in Dr Birdie Riddle to be signed folder

## 2022-04-28 LAB — CULTURE, BLOOD (ROUTINE X 2)
Culture: NO GROWTH
Special Requests: ADEQUATE

## 2022-04-29 ENCOUNTER — Telehealth: Payer: Self-pay

## 2022-04-29 DIAGNOSIS — Z79899 Other long term (current) drug therapy: Secondary | ICD-10-CM | POA: Diagnosis not present

## 2022-04-29 DIAGNOSIS — I872 Venous insufficiency (chronic) (peripheral): Secondary | ICD-10-CM | POA: Diagnosis not present

## 2022-04-29 DIAGNOSIS — G43909 Migraine, unspecified, not intractable, without status migrainosus: Secondary | ICD-10-CM | POA: Diagnosis not present

## 2022-04-29 DIAGNOSIS — G629 Polyneuropathy, unspecified: Secondary | ICD-10-CM | POA: Diagnosis not present

## 2022-04-29 DIAGNOSIS — Z79891 Long term (current) use of opiate analgesic: Secondary | ICD-10-CM | POA: Diagnosis not present

## 2022-04-29 DIAGNOSIS — F32A Depression, unspecified: Secondary | ICD-10-CM | POA: Diagnosis not present

## 2022-04-29 DIAGNOSIS — Z87440 Personal history of urinary (tract) infections: Secondary | ICD-10-CM | POA: Diagnosis not present

## 2022-04-29 DIAGNOSIS — E538 Deficiency of other specified B group vitamins: Secondary | ICD-10-CM | POA: Diagnosis not present

## 2022-04-29 DIAGNOSIS — M4802 Spinal stenosis, cervical region: Secondary | ICD-10-CM | POA: Diagnosis not present

## 2022-04-29 DIAGNOSIS — E559 Vitamin D deficiency, unspecified: Secondary | ICD-10-CM | POA: Diagnosis not present

## 2022-04-29 DIAGNOSIS — Z9181 History of falling: Secondary | ICD-10-CM | POA: Diagnosis not present

## 2022-04-29 DIAGNOSIS — Z993 Dependence on wheelchair: Secondary | ICD-10-CM | POA: Diagnosis not present

## 2022-04-29 DIAGNOSIS — K219 Gastro-esophageal reflux disease without esophagitis: Secondary | ICD-10-CM | POA: Diagnosis not present

## 2022-04-29 DIAGNOSIS — L409 Psoriasis, unspecified: Secondary | ICD-10-CM | POA: Diagnosis not present

## 2022-04-29 DIAGNOSIS — G4733 Obstructive sleep apnea (adult) (pediatric): Secondary | ICD-10-CM | POA: Diagnosis not present

## 2022-04-29 DIAGNOSIS — G9589 Other specified diseases of spinal cord: Secondary | ICD-10-CM | POA: Diagnosis not present

## 2022-04-29 DIAGNOSIS — G9529 Other cord compression: Secondary | ICD-10-CM | POA: Diagnosis not present

## 2022-04-29 DIAGNOSIS — E785 Hyperlipidemia, unspecified: Secondary | ICD-10-CM | POA: Diagnosis not present

## 2022-04-29 DIAGNOSIS — M797 Fibromyalgia: Secondary | ICD-10-CM | POA: Diagnosis not present

## 2022-04-29 DIAGNOSIS — M300 Polyarteritis nodosa: Secondary | ICD-10-CM | POA: Diagnosis not present

## 2022-04-29 DIAGNOSIS — M5136 Other intervertebral disc degeneration, lumbar region: Secondary | ICD-10-CM | POA: Diagnosis not present

## 2022-04-29 DIAGNOSIS — K760 Fatty (change of) liver, not elsewhere classified: Secondary | ICD-10-CM | POA: Diagnosis not present

## 2022-04-29 DIAGNOSIS — I10 Essential (primary) hypertension: Secondary | ICD-10-CM | POA: Diagnosis not present

## 2022-04-29 NOTE — Telephone Encounter (Signed)
Called patient to let her know that I called her insurance company to see what needs to be done to help with transportation to her appt on 12/19. I was told by insurance that patient has to call and set that up through safe ride. Ref# [id number].  I called patient to let her know this information, what she should be asking for and gave her the customer service number and the reference number of my call. Rides should be arranged 3 business days before appts. I asked that [name] make sure she lets them know she needs a ride to and from the appt. I asked that she call back with the info she is given so that we also have anything we may need to help her.

## 2022-04-30 DIAGNOSIS — M1990 Unspecified osteoarthritis, unspecified site: Principal | ICD-10-CM

## 2022-04-30 MED ORDER — DICLOFENAC 1 % TOPICAL GEL
5 refills | 0 days
Start: 2022-04-30 — End: ?

## 2022-05-02 ENCOUNTER — Telehealth: Payer: Self-pay | Admitting: Family Medicine

## 2022-05-02 ENCOUNTER — Ambulatory Visit: Payer: Medicare Other | Admitting: Obstetrics & Gynecology

## 2022-05-02 MED ORDER — DICLOFENAC 1 % TOPICAL GEL
5 refills | 0 days | Status: CP
Start: 2022-05-02 — End: ?

## 2022-05-02 NOTE — Telephone Encounter (Signed)
Forms placed in Dr [name] to be signed folder  

## 2022-05-02 NOTE — Telephone Encounter (Signed)
Received home health forms from [facility]. Placed forms in front bin with charge sheet.

## 2022-05-02 NOTE — Unmapped (Signed)
Diclofenac gel refill  Last Visit Date: 03/11/2021  Next Visit Date: 07/14/2022

## 2022-05-03 ENCOUNTER — Inpatient Hospital Stay: Payer: Medicare Other | Admitting: Family Medicine

## 2022-05-03 ENCOUNTER — Telehealth: Payer: Self-pay

## 2022-05-03 DIAGNOSIS — G629 Polyneuropathy, unspecified: Secondary | ICD-10-CM | POA: Diagnosis not present

## 2022-05-03 DIAGNOSIS — Z87440 Personal history of urinary (tract) infections: Secondary | ICD-10-CM | POA: Diagnosis not present

## 2022-05-03 DIAGNOSIS — G9529 Other cord compression: Secondary | ICD-10-CM | POA: Diagnosis not present

## 2022-05-03 DIAGNOSIS — Z993 Dependence on wheelchair: Secondary | ICD-10-CM | POA: Diagnosis not present

## 2022-05-03 DIAGNOSIS — Z79891 Long term (current) use of opiate analgesic: Secondary | ICD-10-CM | POA: Diagnosis not present

## 2022-05-03 DIAGNOSIS — F32A Depression, unspecified: Secondary | ICD-10-CM | POA: Diagnosis not present

## 2022-05-03 DIAGNOSIS — E559 Vitamin D deficiency, unspecified: Secondary | ICD-10-CM | POA: Diagnosis not present

## 2022-05-03 DIAGNOSIS — I872 Venous insufficiency (chronic) (peripheral): Secondary | ICD-10-CM | POA: Diagnosis not present

## 2022-05-03 DIAGNOSIS — I10 Essential (primary) hypertension: Secondary | ICD-10-CM | POA: Diagnosis not present

## 2022-05-03 DIAGNOSIS — M4802 Spinal stenosis, cervical region: Secondary | ICD-10-CM | POA: Diagnosis not present

## 2022-05-03 DIAGNOSIS — E785 Hyperlipidemia, unspecified: Secondary | ICD-10-CM | POA: Diagnosis not present

## 2022-05-03 DIAGNOSIS — K760 Fatty (change of) liver, not elsewhere classified: Secondary | ICD-10-CM | POA: Diagnosis not present

## 2022-05-03 DIAGNOSIS — Z9181 History of falling: Secondary | ICD-10-CM | POA: Diagnosis not present

## 2022-05-03 DIAGNOSIS — G43909 Migraine, unspecified, not intractable, without status migrainosus: Secondary | ICD-10-CM | POA: Diagnosis not present

## 2022-05-03 DIAGNOSIS — M300 Polyarteritis nodosa: Secondary | ICD-10-CM | POA: Diagnosis not present

## 2022-05-03 DIAGNOSIS — G4733 Obstructive sleep apnea (adult) (pediatric): Secondary | ICD-10-CM | POA: Diagnosis not present

## 2022-05-03 DIAGNOSIS — L409 Psoriasis, unspecified: Secondary | ICD-10-CM | POA: Diagnosis not present

## 2022-05-03 DIAGNOSIS — E538 Deficiency of other specified B group vitamins: Secondary | ICD-10-CM | POA: Diagnosis not present

## 2022-05-03 DIAGNOSIS — M797 Fibromyalgia: Secondary | ICD-10-CM | POA: Diagnosis not present

## 2022-05-03 DIAGNOSIS — G9589 Other specified diseases of spinal cord: Secondary | ICD-10-CM | POA: Diagnosis not present

## 2022-05-03 DIAGNOSIS — M5136 Other intervertebral disc degeneration, lumbar region: Secondary | ICD-10-CM | POA: Diagnosis not present

## 2022-05-03 DIAGNOSIS — Z79899 Other long term (current) drug therapy: Secondary | ICD-10-CM | POA: Diagnosis not present

## 2022-05-03 DIAGNOSIS — K219 Gastro-esophageal reflux disease without esophagitis: Secondary | ICD-10-CM | POA: Diagnosis not present

## 2022-05-03 NOTE — Telephone Encounter (Signed)
Forms faxed and placed in scan  

## 2022-05-03 NOTE — Telephone Encounter (Signed)
Form completed and returned to [place] 

## 2022-05-03 NOTE — Telephone Encounter (Signed)
[place] forms to be signed placed in Dr Birdie Riddle to be signed folder

## 2022-05-03 NOTE — Telephone Encounter (Signed)
Forms have been signed and faxed back and placed in scan

## 2022-05-04 ENCOUNTER — Inpatient Hospital Stay: Payer: Medicare Other | Admitting: Family Medicine

## 2022-05-05 ENCOUNTER — Ambulatory Visit: Payer: Medicare Other | Admitting: Obstetrics & Gynecology

## 2022-05-05 DIAGNOSIS — E559 Vitamin D deficiency, unspecified: Secondary | ICD-10-CM | POA: Diagnosis not present

## 2022-05-05 DIAGNOSIS — K760 Fatty (change of) liver, not elsewhere classified: Secondary | ICD-10-CM | POA: Diagnosis not present

## 2022-05-05 DIAGNOSIS — G9529 Other cord compression: Secondary | ICD-10-CM | POA: Diagnosis not present

## 2022-05-05 DIAGNOSIS — I10 Essential (primary) hypertension: Secondary | ICD-10-CM | POA: Diagnosis not present

## 2022-05-05 DIAGNOSIS — F32A Depression, unspecified: Secondary | ICD-10-CM | POA: Diagnosis not present

## 2022-05-05 DIAGNOSIS — L409 Psoriasis, unspecified: Secondary | ICD-10-CM | POA: Diagnosis not present

## 2022-05-05 DIAGNOSIS — Z993 Dependence on wheelchair: Secondary | ICD-10-CM | POA: Diagnosis not present

## 2022-05-05 DIAGNOSIS — M4802 Spinal stenosis, cervical region: Secondary | ICD-10-CM | POA: Diagnosis not present

## 2022-05-05 DIAGNOSIS — E785 Hyperlipidemia, unspecified: Secondary | ICD-10-CM | POA: Diagnosis not present

## 2022-05-05 DIAGNOSIS — Z9181 History of falling: Secondary | ICD-10-CM | POA: Diagnosis not present

## 2022-05-05 DIAGNOSIS — M797 Fibromyalgia: Secondary | ICD-10-CM | POA: Diagnosis not present

## 2022-05-05 DIAGNOSIS — Z79899 Other long term (current) drug therapy: Secondary | ICD-10-CM | POA: Diagnosis not present

## 2022-05-05 DIAGNOSIS — Z79891 Long term (current) use of opiate analgesic: Secondary | ICD-10-CM | POA: Diagnosis not present

## 2022-05-05 DIAGNOSIS — E538 Deficiency of other specified B group vitamins: Secondary | ICD-10-CM | POA: Diagnosis not present

## 2022-05-05 DIAGNOSIS — G629 Polyneuropathy, unspecified: Secondary | ICD-10-CM | POA: Diagnosis not present

## 2022-05-05 DIAGNOSIS — Z87440 Personal history of urinary (tract) infections: Secondary | ICD-10-CM | POA: Diagnosis not present

## 2022-05-05 DIAGNOSIS — G4733 Obstructive sleep apnea (adult) (pediatric): Secondary | ICD-10-CM | POA: Diagnosis not present

## 2022-05-05 DIAGNOSIS — K219 Gastro-esophageal reflux disease without esophagitis: Secondary | ICD-10-CM | POA: Diagnosis not present

## 2022-05-05 DIAGNOSIS — G43909 Migraine, unspecified, not intractable, without status migrainosus: Secondary | ICD-10-CM | POA: Diagnosis not present

## 2022-05-05 DIAGNOSIS — I872 Venous insufficiency (chronic) (peripheral): Secondary | ICD-10-CM | POA: Diagnosis not present

## 2022-05-05 DIAGNOSIS — G9589 Other specified diseases of spinal cord: Secondary | ICD-10-CM | POA: Diagnosis not present

## 2022-05-05 DIAGNOSIS — M300 Polyarteritis nodosa: Secondary | ICD-10-CM | POA: Diagnosis not present

## 2022-05-05 DIAGNOSIS — M5136 Other intervertebral disc degeneration, lumbar region: Secondary | ICD-10-CM | POA: Diagnosis not present

## 2022-05-06 ENCOUNTER — Encounter: Payer: Self-pay | Admitting: Family Medicine

## 2022-05-06 ENCOUNTER — Ambulatory Visit (INDEPENDENT_AMBULATORY_CARE_PROVIDER_SITE_OTHER): Payer: Medicare Other | Admitting: Family Medicine

## 2022-05-06 VITALS — BP 130/82 | HR 82 | Temp 98.8°F | Resp 17

## 2022-05-06 DIAGNOSIS — E662 Morbid (severe) obesity with alveolar hypoventilation: Secondary | ICD-10-CM | POA: Diagnosis not present

## 2022-05-06 DIAGNOSIS — Z8619 Personal history of other infectious and parasitic diseases: Secondary | ICD-10-CM

## 2022-05-06 DIAGNOSIS — A419 Sepsis, unspecified organism: Secondary | ICD-10-CM

## 2022-05-06 DIAGNOSIS — Z8701 Personal history of pneumonia (recurrent): Secondary | ICD-10-CM

## 2022-05-06 DIAGNOSIS — R7989 Other specified abnormal findings of blood chemistry: Secondary | ICD-10-CM

## 2022-05-06 DIAGNOSIS — J189 Pneumonia, unspecified organism: Secondary | ICD-10-CM

## 2022-05-06 DIAGNOSIS — J9601 Acute respiratory failure with hypoxia: Secondary | ICD-10-CM

## 2022-05-06 DIAGNOSIS — Z8709 Personal history of other diseases of the respiratory system: Secondary | ICD-10-CM | POA: Diagnosis not present

## 2022-05-06 NOTE — Patient Instructions (Addendum)
Follow up as needed or as scheduled We'll notify you of your lab results and make any changes if needed Continue to hold the cholesterol medication until we see the labs We'll call you with your pulmonary referral to see if we need to make adjustments to CPAP Call with any questions or concerns Stay Safe!  Stay Healthy! Happy Holidays!!

## 2022-05-06 NOTE — Progress Notes (Signed)
   Subjective:    Patient ID: [name], female    DOB: [birth year], 51 y.o.   MRN: [id number]  HPI Hospital f/u- pt was found unresponsive at home w/ agonal breathing on 12/7.  Was admitted w/ acute hypoxic respiratory failure, sepsis, and PNA.  Thankfully she rapidly improved and was weaned off O2.  D/C'd on 12/10  She was d/c'd on on Amox 500mg  TID x2 additional days and Zpack.  Her LFTs were elevated during hospitalization and reached as high as AST 165 and ALT 117.  Were down to 45 and 52 at d/c.  Statin was held.  They were also concerned about possible OHS.   Review of Systems For ROS see HPI     Objective:   Physical Exam Vitals reviewed.  Constitutional:      General: She is not in acute distress.    Appearance: She is well-developed. She is obese. She is not ill-appearing.  HENT:     Head: Normocephalic and atraumatic.  Eyes:     Conjunctiva/sclera: Conjunctivae normal.     Pupils: Pupils are equal, round, and reactive to light.  Neck:     Thyroid: No thyromegaly.  Cardiovascular:     Rate and Rhythm: Normal rate and regular rhythm.     Heart sounds: Normal heart sounds. No murmur heard. Pulmonary:     Effort: Pulmonary effort is normal. No respiratory distress.     Breath sounds: Normal breath sounds.  Abdominal:     General: There is no distension.     Palpations: Abdomen is soft.     Tenderness: There is no abdominal tenderness.  Musculoskeletal:     Cervical back: Normal range of motion and neck supple.     Right lower leg: Edema present.     Left lower leg: Edema present.  Lymphadenopathy:     Cervical: No cervical adenopathy.  Skin:    General: Skin is warm and dry.     Findings: Erythema (B LE's consistent w/ venous stasis changes) present.  Neurological:     Mental Status: She is alert and oriented to person, place, and time. Mental status is at baseline.  Psychiatric:        Mood and Affect: Mood normal.        Behavior: Behavior normal.         Thought Content: Thought content normal.           Assessment & Plan:

## 2022-05-07 LAB — BASIC METABOLIC PANEL
BUN/Creatinine Ratio: 11 (calc) (ref 6–22)
BUN: 6 mg/dL — ABNORMAL LOW (ref 7–25)
CO2: 32 mmol/L (ref 20–32)
Calcium: 9.3 mg/dL (ref 8.6–10.4)
Chloride: 100 mmol/L (ref 98–110)
Creat: 0.56 mg/dL (ref 0.50–1.03)
Glucose, Bld: 101 mg/dL — ABNORMAL HIGH (ref 65–99)
Potassium: 4.4 mmol/L (ref 3.5–5.3)
Sodium: 140 mmol/L (ref 135–146)

## 2022-05-07 LAB — HEPATIC FUNCTION PANEL
AG Ratio: 1.2 (calc) (ref 1.0–2.5)
ALT: 18 U/L (ref 6–29)
AST: 19 U/L (ref 10–35)
Albumin: 4 g/dL (ref 3.6–5.1)
Alkaline phosphatase (APISO): 146 U/L (ref 37–153)
Bilirubin, Direct: 0.1 mg/dL (ref 0.0–0.2)
Globulin: 3.4 g/dL (calc) (ref 1.9–3.7)
Indirect Bilirubin: 0.4 mg/dL (calc) (ref 0.2–1.2)
Total Bilirubin: 0.5 mg/dL (ref 0.2–1.2)
Total Protein: 7.4 g/dL (ref 6.1–8.1)

## 2022-05-07 LAB — LIPID PANEL
Cholesterol: 171 mg/dL (ref <200)
HDL: 49 mg/dL — ABNORMAL LOW (ref >=50)
LDL Cholesterol (Calc): 98 mg/dL (calc)
Non-HDL Cholesterol (Calc): 122 mg/dL (calc) (ref <130)
Total CHOL/HDL Ratio: 3.5 (calc) (ref <5.0)
Triglycerides: 142 mg/dL (ref <150)

## 2022-05-07 LAB — CBC WITH DIFFERENTIAL/PLATELET
Absolute Monocytes: 584 cells/uL (ref 200–950)
Basophils Absolute: 69 cells/uL (ref 0–200)
Basophils Relative: 0.7 %
Eosinophils Absolute: 426 cells/uL (ref 15–500)
Eosinophils Relative: 4.3 %
HCT: 42.3 % (ref 35.0–45.0)
Hemoglobin: 14 g/dL (ref 11.7–15.5)
Lymphs Abs: 1307 cells/uL (ref 850–3900)
MCH: 29.9 pg (ref 27.0–33.0)
MCHC: 33.1 g/dL (ref 32.0–36.0)
MCV: 90.4 fL (ref 80.0–100.0)
MPV: 9.9 fL (ref 7.5–12.5)
Monocytes Relative: 5.9 %
Neutro Abs: 7514 cells/uL (ref 1500–7800)
Neutrophils Relative %: 75.9 %
Platelets: 335 10*3/uL (ref 140–400)
RBC: 4.68 10*6/uL (ref 3.80–5.10)
RDW: 12.9 % (ref 11.0–15.0)
Total Lymphocyte: 13.2 %
WBC: 9.9 10*3/uL (ref 3.8–10.8)

## 2022-05-07 LAB — HEMOGLOBIN A1C
Hgb A1c MFr Bld: 5.3 % of total Hgb (ref <5.7)
Mean Plasma Glucose: 105 mg/dL
eAG (mmol/L): 5.8 mmol/L

## 2022-05-07 LAB — TSH: TSH: 1.97 mIU/L

## 2022-05-09 DIAGNOSIS — A419 Sepsis, unspecified organism: Secondary | ICD-10-CM | POA: Diagnosis not present

## 2022-05-09 DIAGNOSIS — E871 Hypo-osmolality and hyponatremia: Secondary | ICD-10-CM | POA: Diagnosis not present

## 2022-05-09 DIAGNOSIS — G4733 Obstructive sleep apnea (adult) (pediatric): Secondary | ICD-10-CM | POA: Diagnosis not present

## 2022-05-11 DIAGNOSIS — M300 Polyarteritis nodosa: Secondary | ICD-10-CM | POA: Diagnosis not present

## 2022-05-11 DIAGNOSIS — G629 Polyneuropathy, unspecified: Secondary | ICD-10-CM | POA: Diagnosis not present

## 2022-05-11 DIAGNOSIS — Z79899 Other long term (current) drug therapy: Secondary | ICD-10-CM | POA: Diagnosis not present

## 2022-05-11 DIAGNOSIS — E559 Vitamin D deficiency, unspecified: Secondary | ICD-10-CM | POA: Diagnosis not present

## 2022-05-11 DIAGNOSIS — E538 Deficiency of other specified B group vitamins: Secondary | ICD-10-CM | POA: Diagnosis not present

## 2022-05-11 DIAGNOSIS — Z87440 Personal history of urinary (tract) infections: Secondary | ICD-10-CM | POA: Diagnosis not present

## 2022-05-11 DIAGNOSIS — M5136 Other intervertebral disc degeneration, lumbar region: Secondary | ICD-10-CM | POA: Diagnosis not present

## 2022-05-11 DIAGNOSIS — L409 Psoriasis, unspecified: Secondary | ICD-10-CM | POA: Diagnosis not present

## 2022-05-11 DIAGNOSIS — M797 Fibromyalgia: Secondary | ICD-10-CM | POA: Diagnosis not present

## 2022-05-11 DIAGNOSIS — I10 Essential (primary) hypertension: Secondary | ICD-10-CM | POA: Diagnosis not present

## 2022-05-11 DIAGNOSIS — I872 Venous insufficiency (chronic) (peripheral): Secondary | ICD-10-CM | POA: Diagnosis not present

## 2022-05-11 DIAGNOSIS — G4733 Obstructive sleep apnea (adult) (pediatric): Secondary | ICD-10-CM | POA: Diagnosis not present

## 2022-05-11 DIAGNOSIS — G9529 Other cord compression: Secondary | ICD-10-CM | POA: Diagnosis not present

## 2022-05-11 DIAGNOSIS — Z79891 Long term (current) use of opiate analgesic: Secondary | ICD-10-CM | POA: Diagnosis not present

## 2022-05-11 DIAGNOSIS — E785 Hyperlipidemia, unspecified: Secondary | ICD-10-CM | POA: Diagnosis not present

## 2022-05-11 DIAGNOSIS — M4802 Spinal stenosis, cervical region: Secondary | ICD-10-CM | POA: Diagnosis not present

## 2022-05-11 DIAGNOSIS — K760 Fatty (change of) liver, not elsewhere classified: Secondary | ICD-10-CM | POA: Diagnosis not present

## 2022-05-11 DIAGNOSIS — Z9181 History of falling: Secondary | ICD-10-CM | POA: Diagnosis not present

## 2022-05-11 DIAGNOSIS — Z993 Dependence on wheelchair: Secondary | ICD-10-CM | POA: Diagnosis not present

## 2022-05-11 DIAGNOSIS — F32A Depression, unspecified: Secondary | ICD-10-CM | POA: Diagnosis not present

## 2022-05-11 DIAGNOSIS — G43909 Migraine, unspecified, not intractable, without status migrainosus: Secondary | ICD-10-CM | POA: Diagnosis not present

## 2022-05-11 DIAGNOSIS — K219 Gastro-esophageal reflux disease without esophagitis: Secondary | ICD-10-CM | POA: Diagnosis not present

## 2022-05-11 DIAGNOSIS — G9589 Other specified diseases of spinal cord: Secondary | ICD-10-CM | POA: Diagnosis not present

## 2022-05-12 DIAGNOSIS — Z87440 Personal history of urinary (tract) infections: Secondary | ICD-10-CM | POA: Diagnosis not present

## 2022-05-12 DIAGNOSIS — F32A Depression, unspecified: Secondary | ICD-10-CM | POA: Diagnosis not present

## 2022-05-12 DIAGNOSIS — I10 Essential (primary) hypertension: Secondary | ICD-10-CM | POA: Diagnosis not present

## 2022-05-12 DIAGNOSIS — G629 Polyneuropathy, unspecified: Secondary | ICD-10-CM | POA: Diagnosis not present

## 2022-05-12 DIAGNOSIS — K760 Fatty (change of) liver, not elsewhere classified: Secondary | ICD-10-CM | POA: Diagnosis not present

## 2022-05-12 DIAGNOSIS — E785 Hyperlipidemia, unspecified: Secondary | ICD-10-CM | POA: Diagnosis not present

## 2022-05-12 DIAGNOSIS — G43909 Migraine, unspecified, not intractable, without status migrainosus: Secondary | ICD-10-CM | POA: Diagnosis not present

## 2022-05-12 DIAGNOSIS — K219 Gastro-esophageal reflux disease without esophagitis: Secondary | ICD-10-CM | POA: Diagnosis not present

## 2022-05-12 DIAGNOSIS — G9529 Other cord compression: Secondary | ICD-10-CM | POA: Diagnosis not present

## 2022-05-12 DIAGNOSIS — M797 Fibromyalgia: Secondary | ICD-10-CM | POA: Diagnosis not present

## 2022-05-12 DIAGNOSIS — L409 Psoriasis, unspecified: Secondary | ICD-10-CM | POA: Diagnosis not present

## 2022-05-12 DIAGNOSIS — M5136 Other intervertebral disc degeneration, lumbar region: Secondary | ICD-10-CM | POA: Diagnosis not present

## 2022-05-12 DIAGNOSIS — E538 Deficiency of other specified B group vitamins: Secondary | ICD-10-CM | POA: Diagnosis not present

## 2022-05-12 DIAGNOSIS — G9589 Other specified diseases of spinal cord: Secondary | ICD-10-CM | POA: Diagnosis not present

## 2022-05-12 DIAGNOSIS — Z79891 Long term (current) use of opiate analgesic: Secondary | ICD-10-CM | POA: Diagnosis not present

## 2022-05-12 DIAGNOSIS — E559 Vitamin D deficiency, unspecified: Secondary | ICD-10-CM | POA: Diagnosis not present

## 2022-05-12 DIAGNOSIS — G4733 Obstructive sleep apnea (adult) (pediatric): Secondary | ICD-10-CM | POA: Diagnosis not present

## 2022-05-12 DIAGNOSIS — Z993 Dependence on wheelchair: Secondary | ICD-10-CM | POA: Diagnosis not present

## 2022-05-12 DIAGNOSIS — Z9181 History of falling: Secondary | ICD-10-CM | POA: Diagnosis not present

## 2022-05-12 DIAGNOSIS — I872 Venous insufficiency (chronic) (peripheral): Secondary | ICD-10-CM | POA: Diagnosis not present

## 2022-05-12 DIAGNOSIS — Z79899 Other long term (current) drug therapy: Secondary | ICD-10-CM | POA: Diagnosis not present

## 2022-05-12 DIAGNOSIS — M300 Polyarteritis nodosa: Secondary | ICD-10-CM | POA: Diagnosis not present

## 2022-05-12 DIAGNOSIS — M4802 Spinal stenosis, cervical region: Secondary | ICD-10-CM | POA: Diagnosis not present

## 2022-05-13 ENCOUNTER — Other Ambulatory Visit: Payer: Self-pay | Admitting: Family Medicine

## 2022-05-13 NOTE — Telephone Encounter (Signed)
Patient is requesting a refill of the following medications: Requested Prescriptions   Pending Prescriptions Disp Refills   HYDROcodone-acetaminophen ([place]) 10-325 MG tablet [Pharmacy Med Name: hydrocodone 10 mg-acetaminophen 325 mg tablet] 90 tablet 0    Sig: TAKE 1 TABLET BY MOUTH EVERY 8 HOURS AS NEEDED FOR UP TO 90 DOSES    Date of patient request: 05/13/22 Last office visit: 05/06/22 Date of last refill: 04/13/22 Last refill amount: 90

## 2022-05-17 ENCOUNTER — Telehealth: Payer: Self-pay

## 2022-05-17 ENCOUNTER — Other Ambulatory Visit: Payer: Self-pay

## 2022-05-17 DIAGNOSIS — Z79899 Other long term (current) drug therapy: Secondary | ICD-10-CM | POA: Diagnosis not present

## 2022-05-17 DIAGNOSIS — Z87440 Personal history of urinary (tract) infections: Secondary | ICD-10-CM | POA: Diagnosis not present

## 2022-05-17 DIAGNOSIS — M300 Polyarteritis nodosa: Secondary | ICD-10-CM | POA: Diagnosis not present

## 2022-05-17 DIAGNOSIS — G43909 Migraine, unspecified, not intractable, without status migrainosus: Secondary | ICD-10-CM | POA: Diagnosis not present

## 2022-05-17 DIAGNOSIS — M797 Fibromyalgia: Secondary | ICD-10-CM | POA: Diagnosis not present

## 2022-05-17 DIAGNOSIS — E538 Deficiency of other specified B group vitamins: Secondary | ICD-10-CM | POA: Diagnosis not present

## 2022-05-17 DIAGNOSIS — Z993 Dependence on wheelchair: Secondary | ICD-10-CM | POA: Diagnosis not present

## 2022-05-17 DIAGNOSIS — E785 Hyperlipidemia, unspecified: Secondary | ICD-10-CM | POA: Diagnosis not present

## 2022-05-17 DIAGNOSIS — I872 Venous insufficiency (chronic) (peripheral): Secondary | ICD-10-CM | POA: Diagnosis not present

## 2022-05-17 DIAGNOSIS — M4802 Spinal stenosis, cervical region: Secondary | ICD-10-CM | POA: Diagnosis not present

## 2022-05-17 DIAGNOSIS — Z9181 History of falling: Secondary | ICD-10-CM | POA: Diagnosis not present

## 2022-05-17 DIAGNOSIS — F32A Depression, unspecified: Secondary | ICD-10-CM | POA: Diagnosis not present

## 2022-05-17 DIAGNOSIS — L409 Psoriasis, unspecified: Secondary | ICD-10-CM | POA: Diagnosis not present

## 2022-05-17 DIAGNOSIS — E559 Vitamin D deficiency, unspecified: Secondary | ICD-10-CM | POA: Diagnosis not present

## 2022-05-17 DIAGNOSIS — K760 Fatty (change of) liver, not elsewhere classified: Secondary | ICD-10-CM | POA: Diagnosis not present

## 2022-05-17 DIAGNOSIS — K219 Gastro-esophageal reflux disease without esophagitis: Secondary | ICD-10-CM | POA: Diagnosis not present

## 2022-05-17 DIAGNOSIS — G4733 Obstructive sleep apnea (adult) (pediatric): Secondary | ICD-10-CM | POA: Diagnosis not present

## 2022-05-17 DIAGNOSIS — G9589 Other specified diseases of spinal cord: Secondary | ICD-10-CM | POA: Diagnosis not present

## 2022-05-17 DIAGNOSIS — Z79891 Long term (current) use of opiate analgesic: Secondary | ICD-10-CM | POA: Diagnosis not present

## 2022-05-17 DIAGNOSIS — G629 Polyneuropathy, unspecified: Secondary | ICD-10-CM | POA: Diagnosis not present

## 2022-05-17 DIAGNOSIS — M5136 Other intervertebral disc degeneration, lumbar region: Secondary | ICD-10-CM | POA: Diagnosis not present

## 2022-05-17 DIAGNOSIS — G9529 Other cord compression: Secondary | ICD-10-CM | POA: Diagnosis not present

## 2022-05-17 DIAGNOSIS — I10 Essential (primary) hypertension: Secondary | ICD-10-CM | POA: Diagnosis not present

## 2022-05-17 NOTE — Telephone Encounter (Signed)
[facility] wanted you to know [name] is still coughing up yellowish mucous, denied fever, lungs sound clear   Finished abx no call back to [facility] needed but she would like [place] to call and advise the patient of any additional treatment.

## 2022-05-17 NOTE — Telephone Encounter (Signed)
Patient is requesting a refill of the following medications: Requested Prescriptions   Pending Prescriptions Disp Refills   HYDROcodone-acetaminophen ([place]) 10-325 MG tablet [Pharmacy Med Name: hydrocodone 10 mg-acetaminophen 325 mg tablet] 90 tablet 0    Sig: TAKE 1 TABLET BY MOUTH EVERY 8 HOURS AS NEEDED FOR UP TO 90 DOSES    Date of patient request: 05/17/21 Last office visit: 05/06/22 Date of last refill: 04/13/22 Last refill amount: 90 Follow up time period per chart: PRN

## 2022-05-17 NOTE — Telephone Encounter (Signed)
Since lungs are clear and pt denies complaints, I would make sure she's taking a daily antihistamine (claritin or zyrtec) and she can use Mucinex to thin out her congestion

## 2022-05-17 NOTE — Telephone Encounter (Signed)
Advised pt to take an OTC Zyrtec or Claritin daily and add Mucinex

## 2022-05-17 NOTE — Telephone Encounter (Signed)
Hydrocodone 10-325 LOV: 05/06/22 Last Refill:04/13/22 Upcoming appt: none

## 2022-05-18 ENCOUNTER — Encounter: Payer: Self-pay | Admitting: Family Medicine

## 2022-05-18 ENCOUNTER — Telehealth: Payer: Self-pay

## 2022-05-18 DIAGNOSIS — G629 Polyneuropathy, unspecified: Secondary | ICD-10-CM | POA: Diagnosis not present

## 2022-05-18 DIAGNOSIS — G9589 Other specified diseases of spinal cord: Secondary | ICD-10-CM | POA: Diagnosis not present

## 2022-05-18 DIAGNOSIS — I10 Essential (primary) hypertension: Secondary | ICD-10-CM | POA: Diagnosis not present

## 2022-05-18 DIAGNOSIS — G4733 Obstructive sleep apnea (adult) (pediatric): Secondary | ICD-10-CM | POA: Diagnosis not present

## 2022-05-18 DIAGNOSIS — L409 Psoriasis, unspecified: Secondary | ICD-10-CM | POA: Diagnosis not present

## 2022-05-18 DIAGNOSIS — Z79899 Other long term (current) drug therapy: Secondary | ICD-10-CM | POA: Diagnosis not present

## 2022-05-18 DIAGNOSIS — G9529 Other cord compression: Secondary | ICD-10-CM | POA: Diagnosis not present

## 2022-05-18 DIAGNOSIS — E785 Hyperlipidemia, unspecified: Secondary | ICD-10-CM | POA: Diagnosis not present

## 2022-05-18 DIAGNOSIS — K219 Gastro-esophageal reflux disease without esophagitis: Secondary | ICD-10-CM | POA: Diagnosis not present

## 2022-05-18 DIAGNOSIS — G43909 Migraine, unspecified, not intractable, without status migrainosus: Secondary | ICD-10-CM | POA: Diagnosis not present

## 2022-05-18 DIAGNOSIS — F32A Depression, unspecified: Secondary | ICD-10-CM | POA: Diagnosis not present

## 2022-05-18 DIAGNOSIS — M5136 Other intervertebral disc degeneration, lumbar region: Secondary | ICD-10-CM | POA: Diagnosis not present

## 2022-05-18 DIAGNOSIS — Z993 Dependence on wheelchair: Secondary | ICD-10-CM | POA: Diagnosis not present

## 2022-05-18 DIAGNOSIS — K760 Fatty (change of) liver, not elsewhere classified: Secondary | ICD-10-CM | POA: Diagnosis not present

## 2022-05-18 DIAGNOSIS — Z87440 Personal history of urinary (tract) infections: Secondary | ICD-10-CM | POA: Diagnosis not present

## 2022-05-18 DIAGNOSIS — I872 Venous insufficiency (chronic) (peripheral): Secondary | ICD-10-CM | POA: Diagnosis not present

## 2022-05-18 DIAGNOSIS — E559 Vitamin D deficiency, unspecified: Secondary | ICD-10-CM | POA: Diagnosis not present

## 2022-05-18 DIAGNOSIS — M300 Polyarteritis nodosa: Secondary | ICD-10-CM | POA: Diagnosis not present

## 2022-05-18 DIAGNOSIS — Z9181 History of falling: Secondary | ICD-10-CM | POA: Diagnosis not present

## 2022-05-18 DIAGNOSIS — M4802 Spinal stenosis, cervical region: Secondary | ICD-10-CM | POA: Diagnosis not present

## 2022-05-18 DIAGNOSIS — M797 Fibromyalgia: Secondary | ICD-10-CM | POA: Diagnosis not present

## 2022-05-18 DIAGNOSIS — Z79891 Long term (current) use of opiate analgesic: Secondary | ICD-10-CM | POA: Diagnosis not present

## 2022-05-18 DIAGNOSIS — E538 Deficiency of other specified B group vitamins: Secondary | ICD-10-CM | POA: Diagnosis not present

## 2022-05-18 NOTE — Telephone Encounter (Signed)
Will have Dr Birdie Riddle sign letter in the am and fax to [phone]

## 2022-05-18 NOTE — Telephone Encounter (Signed)
Letter written and printed and signed

## 2022-05-18 NOTE — Telephone Encounter (Signed)
Pt is asking if we can write a letter stating that the pt Son [name] is her caregiver 24/7. That he helps her with baths , medication , dr apts, food ,paying bills etc so he can paid for taking care of her . Letter needs to go to [employer] .

## 2022-05-18 NOTE — Telephone Encounter (Signed)
Patient called back with the Fax number is [phone]

## 2022-05-18 NOTE — Telephone Encounter (Signed)
[name] will call back with a number where we may send the letter to

## 2022-05-18 NOTE — Telephone Encounter (Signed)
Dr Birdie Riddle sent Rx in and then she received a duplicate . Pt aware the Rx has been sent in

## 2022-05-19 NOTE — Telephone Encounter (Signed)
Letter has been faxed.

## 2022-05-24 ENCOUNTER — Telehealth: Payer: Self-pay | Admitting: Family Medicine

## 2022-05-24 DIAGNOSIS — M797 Fibromyalgia: Secondary | ICD-10-CM | POA: Diagnosis not present

## 2022-05-24 DIAGNOSIS — Z87440 Personal history of urinary (tract) infections: Secondary | ICD-10-CM | POA: Diagnosis not present

## 2022-05-24 DIAGNOSIS — I10 Essential (primary) hypertension: Secondary | ICD-10-CM | POA: Diagnosis not present

## 2022-05-24 DIAGNOSIS — G9529 Other cord compression: Secondary | ICD-10-CM | POA: Diagnosis not present

## 2022-05-24 DIAGNOSIS — Z79891 Long term (current) use of opiate analgesic: Secondary | ICD-10-CM | POA: Diagnosis not present

## 2022-05-24 DIAGNOSIS — E538 Deficiency of other specified B group vitamins: Secondary | ICD-10-CM | POA: Diagnosis not present

## 2022-05-24 DIAGNOSIS — G4733 Obstructive sleep apnea (adult) (pediatric): Secondary | ICD-10-CM | POA: Diagnosis not present

## 2022-05-24 DIAGNOSIS — Z9181 History of falling: Secondary | ICD-10-CM | POA: Diagnosis not present

## 2022-05-24 DIAGNOSIS — K760 Fatty (change of) liver, not elsewhere classified: Secondary | ICD-10-CM | POA: Diagnosis not present

## 2022-05-24 DIAGNOSIS — I872 Venous insufficiency (chronic) (peripheral): Secondary | ICD-10-CM | POA: Diagnosis not present

## 2022-05-24 DIAGNOSIS — L409 Psoriasis, unspecified: Secondary | ICD-10-CM | POA: Diagnosis not present

## 2022-05-24 DIAGNOSIS — Z993 Dependence on wheelchair: Secondary | ICD-10-CM | POA: Diagnosis not present

## 2022-05-24 DIAGNOSIS — G9589 Other specified diseases of spinal cord: Secondary | ICD-10-CM | POA: Diagnosis not present

## 2022-05-24 DIAGNOSIS — Z79899 Other long term (current) drug therapy: Secondary | ICD-10-CM | POA: Diagnosis not present

## 2022-05-24 DIAGNOSIS — M5136 Other intervertebral disc degeneration, lumbar region: Secondary | ICD-10-CM | POA: Diagnosis not present

## 2022-05-24 DIAGNOSIS — M300 Polyarteritis nodosa: Secondary | ICD-10-CM | POA: Diagnosis not present

## 2022-05-24 DIAGNOSIS — E785 Hyperlipidemia, unspecified: Secondary | ICD-10-CM | POA: Diagnosis not present

## 2022-05-24 DIAGNOSIS — G629 Polyneuropathy, unspecified: Secondary | ICD-10-CM | POA: Diagnosis not present

## 2022-05-24 DIAGNOSIS — K219 Gastro-esophageal reflux disease without esophagitis: Secondary | ICD-10-CM | POA: Diagnosis not present

## 2022-05-24 DIAGNOSIS — M4802 Spinal stenosis, cervical region: Secondary | ICD-10-CM | POA: Diagnosis not present

## 2022-05-24 DIAGNOSIS — F32A Depression, unspecified: Secondary | ICD-10-CM | POA: Diagnosis not present

## 2022-05-24 DIAGNOSIS — E559 Vitamin D deficiency, unspecified: Secondary | ICD-10-CM | POA: Diagnosis not present

## 2022-05-24 DIAGNOSIS — G43909 Migraine, unspecified, not intractable, without status migrainosus: Secondary | ICD-10-CM | POA: Diagnosis not present

## 2022-05-24 NOTE — Telephone Encounter (Signed)
Received home health forms from [facility]. Placed in front bin with charge sheet. 

## 2022-05-24 NOTE — Telephone Encounter (Signed)
Placed forms in Dr Birdie Riddle to be signed folder

## 2022-05-24 NOTE — Telephone Encounter (Signed)
Signed and returned to [facility] 

## 2022-05-24 NOTE — Telephone Encounter (Signed)
Form faxed and placed in scan  

## 2022-05-25 ENCOUNTER — Other Ambulatory Visit: Payer: Self-pay | Admitting: Family Medicine

## 2022-05-25 NOTE — Telephone Encounter (Signed)
Notified pt that Rx was sent in

## 2022-05-25 NOTE — Progress Notes (Signed)
Requested prescription sent to pharmacy.

## 2022-05-25 NOTE — Telephone Encounter (Signed)
Xanax 0.5 mg LOV: 05/06/22 Last Refill:04/24/22 Upcoming appt: no apt

## 2022-05-26 ENCOUNTER — Encounter: Payer: Self-pay | Admitting: Family Medicine

## 2022-05-26 ENCOUNTER — Telehealth: Payer: Self-pay | Admitting: Family Medicine

## 2022-05-26 DIAGNOSIS — E538 Deficiency of other specified B group vitamins: Secondary | ICD-10-CM | POA: Diagnosis not present

## 2022-05-26 DIAGNOSIS — Z87440 Personal history of urinary (tract) infections: Secondary | ICD-10-CM | POA: Diagnosis not present

## 2022-05-26 DIAGNOSIS — M797 Fibromyalgia: Secondary | ICD-10-CM | POA: Diagnosis not present

## 2022-05-26 DIAGNOSIS — Z9181 History of falling: Secondary | ICD-10-CM | POA: Diagnosis not present

## 2022-05-26 DIAGNOSIS — I10 Essential (primary) hypertension: Secondary | ICD-10-CM | POA: Diagnosis not present

## 2022-05-26 DIAGNOSIS — G9589 Other specified diseases of spinal cord: Secondary | ICD-10-CM | POA: Diagnosis not present

## 2022-05-26 DIAGNOSIS — I872 Venous insufficiency (chronic) (peripheral): Secondary | ICD-10-CM | POA: Diagnosis not present

## 2022-05-26 DIAGNOSIS — Z993 Dependence on wheelchair: Secondary | ICD-10-CM | POA: Diagnosis not present

## 2022-05-26 DIAGNOSIS — M4802 Spinal stenosis, cervical region: Secondary | ICD-10-CM | POA: Diagnosis not present

## 2022-05-26 DIAGNOSIS — F32A Depression, unspecified: Secondary | ICD-10-CM | POA: Diagnosis not present

## 2022-05-26 DIAGNOSIS — G9529 Other cord compression: Secondary | ICD-10-CM | POA: Diagnosis not present

## 2022-05-26 DIAGNOSIS — M300 Polyarteritis nodosa: Secondary | ICD-10-CM | POA: Diagnosis not present

## 2022-05-26 DIAGNOSIS — E559 Vitamin D deficiency, unspecified: Secondary | ICD-10-CM | POA: Diagnosis not present

## 2022-05-26 DIAGNOSIS — G629 Polyneuropathy, unspecified: Secondary | ICD-10-CM | POA: Diagnosis not present

## 2022-05-26 DIAGNOSIS — M5136 Other intervertebral disc degeneration, lumbar region: Secondary | ICD-10-CM | POA: Diagnosis not present

## 2022-05-26 DIAGNOSIS — G4733 Obstructive sleep apnea (adult) (pediatric): Secondary | ICD-10-CM | POA: Diagnosis not present

## 2022-05-26 DIAGNOSIS — K760 Fatty (change of) liver, not elsewhere classified: Secondary | ICD-10-CM | POA: Diagnosis not present

## 2022-05-26 DIAGNOSIS — Z79899 Other long term (current) drug therapy: Secondary | ICD-10-CM | POA: Diagnosis not present

## 2022-05-26 DIAGNOSIS — Z79891 Long term (current) use of opiate analgesic: Secondary | ICD-10-CM | POA: Diagnosis not present

## 2022-05-26 DIAGNOSIS — K219 Gastro-esophageal reflux disease without esophagitis: Secondary | ICD-10-CM | POA: Diagnosis not present

## 2022-05-26 DIAGNOSIS — E785 Hyperlipidemia, unspecified: Secondary | ICD-10-CM | POA: Diagnosis not present

## 2022-05-26 DIAGNOSIS — L409 Psoriasis, unspecified: Secondary | ICD-10-CM | POA: Diagnosis not present

## 2022-05-26 DIAGNOSIS — G43909 Migraine, unspecified, not intractable, without status migrainosus: Secondary | ICD-10-CM | POA: Diagnosis not present

## 2022-05-26 NOTE — Telephone Encounter (Signed)
Patient called stating that [employer] still does not have the notes for why she can't use a manual cane/walker or wheelchair. I see the note from 12/22 is still open and those are the notes we are waiting on to send.    Fax: [phone]

## 2022-05-26 NOTE — Telephone Encounter (Signed)
Yes, I was saving that note for when I had a bit more time to make sure I reviewed the prior questionnaires and forms they have sent and that everything is included

## 2022-05-27 DIAGNOSIS — Z79891 Long term (current) use of opiate analgesic: Secondary | ICD-10-CM | POA: Diagnosis not present

## 2022-05-27 DIAGNOSIS — Z9181 History of falling: Secondary | ICD-10-CM | POA: Diagnosis not present

## 2022-05-27 DIAGNOSIS — M4802 Spinal stenosis, cervical region: Secondary | ICD-10-CM | POA: Diagnosis not present

## 2022-05-27 DIAGNOSIS — G4733 Obstructive sleep apnea (adult) (pediatric): Secondary | ICD-10-CM | POA: Diagnosis not present

## 2022-05-27 DIAGNOSIS — M797 Fibromyalgia: Secondary | ICD-10-CM | POA: Diagnosis not present

## 2022-05-27 DIAGNOSIS — K219 Gastro-esophageal reflux disease without esophagitis: Secondary | ICD-10-CM | POA: Diagnosis not present

## 2022-05-27 DIAGNOSIS — G9529 Other cord compression: Secondary | ICD-10-CM | POA: Diagnosis not present

## 2022-05-27 DIAGNOSIS — Z993 Dependence on wheelchair: Secondary | ICD-10-CM | POA: Diagnosis not present

## 2022-05-27 DIAGNOSIS — I872 Venous insufficiency (chronic) (peripheral): Secondary | ICD-10-CM | POA: Diagnosis not present

## 2022-05-27 DIAGNOSIS — M300 Polyarteritis nodosa: Secondary | ICD-10-CM | POA: Diagnosis not present

## 2022-05-27 DIAGNOSIS — G43909 Migraine, unspecified, not intractable, without status migrainosus: Secondary | ICD-10-CM | POA: Diagnosis not present

## 2022-05-27 DIAGNOSIS — F32A Depression, unspecified: Secondary | ICD-10-CM | POA: Diagnosis not present

## 2022-05-27 DIAGNOSIS — K760 Fatty (change of) liver, not elsewhere classified: Secondary | ICD-10-CM | POA: Diagnosis not present

## 2022-05-27 DIAGNOSIS — E785 Hyperlipidemia, unspecified: Secondary | ICD-10-CM | POA: Diagnosis not present

## 2022-05-27 DIAGNOSIS — L409 Psoriasis, unspecified: Secondary | ICD-10-CM | POA: Diagnosis not present

## 2022-05-27 DIAGNOSIS — Z87440 Personal history of urinary (tract) infections: Secondary | ICD-10-CM | POA: Diagnosis not present

## 2022-05-27 DIAGNOSIS — I10 Essential (primary) hypertension: Secondary | ICD-10-CM | POA: Diagnosis not present

## 2022-05-27 DIAGNOSIS — E559 Vitamin D deficiency, unspecified: Secondary | ICD-10-CM | POA: Diagnosis not present

## 2022-05-27 DIAGNOSIS — E538 Deficiency of other specified B group vitamins: Secondary | ICD-10-CM | POA: Diagnosis not present

## 2022-05-27 DIAGNOSIS — M5136 Other intervertebral disc degeneration, lumbar region: Secondary | ICD-10-CM | POA: Diagnosis not present

## 2022-05-27 DIAGNOSIS — Z79899 Other long term (current) drug therapy: Secondary | ICD-10-CM | POA: Diagnosis not present

## 2022-05-27 DIAGNOSIS — G629 Polyneuropathy, unspecified: Secondary | ICD-10-CM | POA: Diagnosis not present

## 2022-05-27 DIAGNOSIS — G9589 Other specified diseases of spinal cord: Secondary | ICD-10-CM | POA: Diagnosis not present

## 2022-05-30 DIAGNOSIS — K219 Gastro-esophageal reflux disease without esophagitis: Secondary | ICD-10-CM | POA: Diagnosis not present

## 2022-05-30 DIAGNOSIS — G9529 Other cord compression: Secondary | ICD-10-CM | POA: Diagnosis not present

## 2022-05-30 DIAGNOSIS — G9589 Other specified diseases of spinal cord: Secondary | ICD-10-CM | POA: Diagnosis not present

## 2022-05-30 DIAGNOSIS — M5136 Other intervertebral disc degeneration, lumbar region: Secondary | ICD-10-CM | POA: Diagnosis not present

## 2022-05-30 DIAGNOSIS — Z87440 Personal history of urinary (tract) infections: Secondary | ICD-10-CM | POA: Diagnosis not present

## 2022-05-30 DIAGNOSIS — E538 Deficiency of other specified B group vitamins: Secondary | ICD-10-CM | POA: Diagnosis not present

## 2022-05-30 DIAGNOSIS — I872 Venous insufficiency (chronic) (peripheral): Secondary | ICD-10-CM | POA: Diagnosis not present

## 2022-05-30 DIAGNOSIS — M797 Fibromyalgia: Secondary | ICD-10-CM | POA: Diagnosis not present

## 2022-05-30 DIAGNOSIS — G629 Polyneuropathy, unspecified: Secondary | ICD-10-CM | POA: Diagnosis not present

## 2022-05-30 DIAGNOSIS — F32A Depression, unspecified: Secondary | ICD-10-CM | POA: Diagnosis not present

## 2022-05-30 DIAGNOSIS — I10 Essential (primary) hypertension: Secondary | ICD-10-CM | POA: Diagnosis not present

## 2022-05-30 DIAGNOSIS — Z993 Dependence on wheelchair: Secondary | ICD-10-CM | POA: Diagnosis not present

## 2022-05-30 DIAGNOSIS — K760 Fatty (change of) liver, not elsewhere classified: Secondary | ICD-10-CM | POA: Diagnosis not present

## 2022-05-30 DIAGNOSIS — G43909 Migraine, unspecified, not intractable, without status migrainosus: Secondary | ICD-10-CM | POA: Diagnosis not present

## 2022-05-30 DIAGNOSIS — Z79899 Other long term (current) drug therapy: Secondary | ICD-10-CM | POA: Diagnosis not present

## 2022-05-30 DIAGNOSIS — E559 Vitamin D deficiency, unspecified: Secondary | ICD-10-CM | POA: Diagnosis not present

## 2022-05-30 DIAGNOSIS — M4802 Spinal stenosis, cervical region: Secondary | ICD-10-CM | POA: Diagnosis not present

## 2022-05-30 DIAGNOSIS — Z79891 Long term (current) use of opiate analgesic: Secondary | ICD-10-CM | POA: Diagnosis not present

## 2022-05-30 DIAGNOSIS — E785 Hyperlipidemia, unspecified: Secondary | ICD-10-CM | POA: Diagnosis not present

## 2022-05-30 DIAGNOSIS — Z9181 History of falling: Secondary | ICD-10-CM | POA: Diagnosis not present

## 2022-05-30 DIAGNOSIS — L409 Psoriasis, unspecified: Secondary | ICD-10-CM | POA: Diagnosis not present

## 2022-05-30 DIAGNOSIS — M300 Polyarteritis nodosa: Secondary | ICD-10-CM | POA: Diagnosis not present

## 2022-05-30 DIAGNOSIS — G4733 Obstructive sleep apnea (adult) (pediatric): Secondary | ICD-10-CM | POA: Diagnosis not present

## 2022-05-31 ENCOUNTER — Telehealth: Payer: Self-pay | Admitting: Family Medicine

## 2022-05-31 NOTE — Telephone Encounter (Signed)
Will place in Dr Birdie Riddle to be signed folder

## 2022-05-31 NOTE — Telephone Encounter (Signed)
Fax came over from [facility] and [employer]  for scooter for pt.

## 2022-06-01 ENCOUNTER — Telehealth: Payer: Self-pay

## 2022-06-01 ENCOUNTER — Encounter: Payer: Self-pay | Admitting: Family Medicine

## 2022-06-01 ENCOUNTER — Encounter: Payer: Self-pay | Admitting: *Deleted

## 2022-06-01 ENCOUNTER — Telehealth (INDEPENDENT_AMBULATORY_CARE_PROVIDER_SITE_OTHER): Payer: Medicaid Other | Admitting: Family Medicine

## 2022-06-01 ENCOUNTER — Telehealth: Payer: Self-pay | Admitting: *Deleted

## 2022-06-01 ENCOUNTER — Telehealth: Payer: Self-pay | Admitting: Family Medicine

## 2022-06-01 VITALS — BP 125/71

## 2022-06-01 DIAGNOSIS — M797 Fibromyalgia: Secondary | ICD-10-CM | POA: Diagnosis not present

## 2022-06-01 DIAGNOSIS — Z79891 Long term (current) use of opiate analgesic: Secondary | ICD-10-CM | POA: Diagnosis not present

## 2022-06-01 DIAGNOSIS — M300 Polyarteritis nodosa: Secondary | ICD-10-CM | POA: Diagnosis not present

## 2022-06-01 DIAGNOSIS — E559 Vitamin D deficiency, unspecified: Secondary | ICD-10-CM | POA: Diagnosis not present

## 2022-06-01 DIAGNOSIS — Z993 Dependence on wheelchair: Secondary | ICD-10-CM | POA: Diagnosis not present

## 2022-06-01 DIAGNOSIS — K219 Gastro-esophageal reflux disease without esophagitis: Secondary | ICD-10-CM | POA: Diagnosis not present

## 2022-06-01 DIAGNOSIS — Z87440 Personal history of urinary (tract) infections: Secondary | ICD-10-CM | POA: Diagnosis not present

## 2022-06-01 DIAGNOSIS — I10 Essential (primary) hypertension: Secondary | ICD-10-CM | POA: Diagnosis not present

## 2022-06-01 DIAGNOSIS — M5136 Other intervertebral disc degeneration, lumbar region: Secondary | ICD-10-CM | POA: Diagnosis not present

## 2022-06-01 DIAGNOSIS — K760 Fatty (change of) liver, not elsewhere classified: Secondary | ICD-10-CM | POA: Diagnosis not present

## 2022-06-01 DIAGNOSIS — G43909 Migraine, unspecified, not intractable, without status migrainosus: Secondary | ICD-10-CM | POA: Diagnosis not present

## 2022-06-01 DIAGNOSIS — R051 Acute cough: Secondary | ICD-10-CM

## 2022-06-01 DIAGNOSIS — M4802 Spinal stenosis, cervical region: Secondary | ICD-10-CM | POA: Diagnosis not present

## 2022-06-01 DIAGNOSIS — G9589 Other specified diseases of spinal cord: Secondary | ICD-10-CM | POA: Diagnosis not present

## 2022-06-01 DIAGNOSIS — F32A Depression, unspecified: Secondary | ICD-10-CM | POA: Diagnosis not present

## 2022-06-01 DIAGNOSIS — L409 Psoriasis, unspecified: Secondary | ICD-10-CM | POA: Diagnosis not present

## 2022-06-01 DIAGNOSIS — I872 Venous insufficiency (chronic) (peripheral): Secondary | ICD-10-CM | POA: Diagnosis not present

## 2022-06-01 DIAGNOSIS — G4733 Obstructive sleep apnea (adult) (pediatric): Secondary | ICD-10-CM | POA: Diagnosis not present

## 2022-06-01 DIAGNOSIS — G629 Polyneuropathy, unspecified: Secondary | ICD-10-CM | POA: Diagnosis not present

## 2022-06-01 DIAGNOSIS — Z9181 History of falling: Secondary | ICD-10-CM | POA: Diagnosis not present

## 2022-06-01 DIAGNOSIS — E538 Deficiency of other specified B group vitamins: Secondary | ICD-10-CM | POA: Diagnosis not present

## 2022-06-01 DIAGNOSIS — Z79899 Other long term (current) drug therapy: Secondary | ICD-10-CM | POA: Diagnosis not present

## 2022-06-01 DIAGNOSIS — G9529 Other cord compression: Secondary | ICD-10-CM | POA: Diagnosis not present

## 2022-06-01 DIAGNOSIS — E785 Hyperlipidemia, unspecified: Secondary | ICD-10-CM | POA: Diagnosis not present

## 2022-06-01 MED ORDER — DOXYCYCLINE HYCLATE 100 MG PO TABS: 100.0000 mg | ORAL_TABLET | Freq: Two times a day (BID) | ORAL | 0 refills | Status: DC

## 2022-06-01 NOTE — Telephone Encounter (Signed)
This letter has been re printed and faxed back

## 2022-06-01 NOTE — Patient Instructions (Signed)
Visit Information  Thank you for taking time to visit with me today. Please don't hesitate to contact me if I can be of assistance to you.   Following are the goals we discussed today:   Goals Addressed               This Visit's Progress     COMPLETED: Care coordination activity (pt-stated)        Care Coordination Interventions: Reviewed medications with patient and discussed adherence with no needed refills Reviewed scheduled/upcoming provider appointments including sufficient transportation source Screening for signs and symptoms of depression related to chronic disease state  Assessed social determinant of health barriers Educated on care management services with no needs presented today.         Please call the care guide team at [phone] if you need to cancel or reschedule your appointment.   If you are experiencing a Mental Health or [place] or need someone to talk to, please call the Suicide and Crisis Lifeline: 988  Patient verbalizes understanding of instructions and care plan provided today and agrees to view in [place]. Active MyChart status and patient understanding of how to access instructions and care plan via MyChart confirmed with patient.     No further follow up required: No needs  Raina Mina, RN Care Management Coordinator [place] Office [phone]

## 2022-06-01 NOTE — Telephone Encounter (Signed)
This letter was already written

## 2022-06-01 NOTE — Progress Notes (Signed)
Virtual Visit via Video   I connected with patient on 06/01/22 at 11:00 AM EST by a video enabled telemedicine application and verified that I am speaking with the correct person using two identifiers.  Location patient: Home Location provider: [facility], [facility] Persons participating in the virtual visit: Patient, Provider, [place] C)  I discussed the limitations of evaluation and management by telemedicine and the availability of in person appointments. The patient expressed understanding and agreed to proceed.  Subjective:   HPI:   Cough- pt reports cough is productive and now green.  Has hx of PNA.  Taking Mucinex and Zyrtec.  Was hospitalized in December w/ hypoxia and respiratory failure.  Treated w/ Amox and Azithro.  No fever.  Denies sinus pain/pressure- 'it's a little tender'.    ROS:   See pertinent positives and negatives per HPI.  Patient Active Problem List   Diagnosis Date Noted   Elevated LFTs 05/06/2022   Obesity hypoventilation syndrome ([place]) 05/06/2022   Acute respiratory failure with hypoxia (HCC) 04/22/2022   Hypokalemia 04/22/2022   Leg cramps 01/12/2022   CAP (community acquired pneumonia) 08/12/2021   Fever of unknown origin 08/12/2021   Sacral pressure sore 07/06/2021   Vitamin B6 deficiency 06/08/2021   Myelomalacia of cervical cord ([place]) 06/06/2021   Compressive cervical cord myelomalacia ([place]) 06/05/2021   B12 deficiency 06/05/2021   Folate deficiency 06/05/2021   Vitamin D deficiency 06/05/2021   Leg weakness, bilateral 06/04/2021   Cervical atypia 01/06/2021   Steatosis of liver 01/06/2021   Hyponatremia 07/27/2019   Sepsis ([place]) 07/26/2019   Urinary urgency 04/04/2018   Chronic narcotic use 09/07/2016   Acute blood loss anemia 01/14/2016   Multiple gastric ulcers    PAN (polyarteritis nodosa) ([place]) 11/24/2015   GERD (gastroesophageal reflux disease) 01/08/2014   Routine general medical examination at a health care facility  04/23/2013   Bariatric surgery status 10/31/2012   S/P skin biopsy 10/31/2012   Psoriasis 01/09/2012   DDD (degenerative disc disease), lumbar 11/30/2011   Migraine headache    [name] (obstructive sleep apnea) 11/16/2010   HYPERGLYCEMIA, FASTING 10/08/2009   CONTRACTURE OF TENDON 08/17/2009   PARESTHESIA, HANDS 12/23/2008   Leg pain, right 11/05/2008   ADVERSE DRUG REACTION 04/03/2008   PERIPHERAL EDEMA 03/26/2008   Obesity, morbid, BMI 50 or higher ([place]) 01/03/2007   Cellulitis 01/03/2007   Hyperlipidemia 09/22/2006   Fibromyalgia 09/20/2006   Anxiety and depression 06/28/2006   Essential hypertension 06/28/2006    Social History   Tobacco Use   Smoking status: Never   Smokeless tobacco: Never  Substance Use Topics   Alcohol use: No    Current Outpatient Medications:    ALPRAZolam (XANAX) 0.5 MG tablet, TAKE 1 TABLET BY MOUTH 2 TIMES DAILY, Disp: 30 tablet, Rfl: 3   ARIPiprazole (ABILIFY) 15 MG tablet, TAKE 1 TABLET BY MOUTH EVERY DAY, Disp: 90 tablet, Rfl: 1   Armodafinil 250 MG tablet, Take 250 mg by mouth in the morning and at bedtime., Disp: , Rfl:    Biotin 10 MG TABS, Take 10 mg by mouth in the morning. , Disp: , Rfl:    buPROPion (WELLBUTRIN XL) 300 MG 24 hr tablet, TAKE 1 TABLET BY MOUTH EVERY DAY (Patient taking differently: Take 300 mg by mouth daily.), Disp: 30 tablet, Rfl: 2   calcium citrate (CALCITRATE - DOSED IN MG ELEMENTAL CALCIUM) 950 MG tablet, Take 1 tablet by mouth daily., Disp: , Rfl:    cyanocobalamin (VITAMIN B12) 1000  MCG tablet, TAKE 1 TABLET BY MOUTH EVERY DAY, Disp: 30 tablet, Rfl: 2   cyclobenzaprine (FLEXERIL) 10 MG tablet, TAKE 2 TABLETS BY MOUTH 2 TIMES DAILY, Disp: 120 tablet, Rfl: 2   diclofenac Sodium (VOLTAREN) 1 % GEL, Apply 4 g topically 4 (four) times daily., Disp: , Rfl:    DULoxetine (CYMBALTA) 60 MG capsule, Take 2 capsules (120 mg total) by mouth daily. (Patient taking differently: Take 60 mg by mouth 2 (two) times daily.), Disp: 240  capsule, Rfl: 2   FEROSUL 325 (65 Fe) MG tablet, TAKE 1 TABLET BY MOUTH EVERY DAY WITH BREAKFAST (Patient taking differently: Take 325 mg by mouth daily.), Disp: 30 tablet, Rfl: 2   folic acid (FOLVITE) 1 MG tablet, TAKE 1 TABLET BY MOUTH EVERY DAY (Patient taking differently: Take 1 mg by mouth daily.), Disp: 30 tablet, Rfl: 2   gabapentin (NEURONTIN) 600 MG tablet, Take 1 tablet (600 mg total) by mouth 3 (three) times daily. TAKE 2 TABLETS BY MOUTH 3 TIMES DAILY (Patient taking differently: Take 600 mg by mouth 3 (three) times daily.), Disp: 270 tablet, Rfl: 0   GNP VITAMIN B-1 100 MG tablet, TAKE 1 TABLET BY MOUTH EVERY DAY, Disp: 30 tablet, Rfl: 2   HYDROcodone-acetaminophen (NORCO) 10-325 MG tablet, TAKE 1 TABLET BY MOUTH EVERY 8 HOURS AS NEEDED FOR UP TO 90 DOSES, Disp: 90 tablet, Rfl: 0   nebivolol (BYSTOLIC) 10 MG tablet, TAKE 1 TABLET BY MOUTH EVERY DAY (Patient taking differently: Take 10 mg by mouth daily.), Disp: 30 tablet, Rfl: 2   nystatin (MYCOSTATIN/NYSTOP) powder, APPLY 1 APPLICATION TOPICALLY 3 TIMES DAILY, Disp: 60 g, Rfl: 3   Oxcarbazepine (TRILEPTAL) 300 MG tablet, Take 300 mg by mouth 2 (two) times daily., Disp: , Rfl:    pantoprazole (PROTONIX) 40 MG tablet, TAKE 1 TABLET BY MOUTH 2 TIMES DAILY, Disp: 60 tablet, Rfl: 2   promethazine (PHENERGAN) 25 MG tablet, TAKE 1 TABLET BY MOUTH EVERY 6 HOURS AS NEEDED FOR NAUSEA AND VOMITING (Patient taking differently: Take 25 mg by mouth every 6 (six) hours as needed for vomiting or nausea.), Disp: 45 tablet, Rfl: 3   pyridOXINE (VITAMIN B6) 100 MG tablet, TAKE 1 TABLET BY MOUTH EVERY DAY (Patient taking differently: Take 100 mg by mouth daily.), Disp: 30 tablet, Rfl: 2   SKYRIZI 150 MG/ML SOSY, Inject 150 mg into the skin every 3 (three) months., Disp: , Rfl:   Allergies  Allergen Reactions   Niacin Anaphylaxis, Shortness Of Breath and Swelling    Swelling and "problems breathing"   Sulfamethoxazole-Trimethoprim Anaphylaxis, Swelling,  Rash and Other (See Comments)    Throat closed and the eyes became swollen   Aspirin Hives   Bactrim Hives   Benzoin Compound Rash   Cephalexin Hives    Tolerated ceftriaxone and cefepime 07/2019   Iohexol Hives    Pt treated with PO benedryl   Lisinopril Hives   Naproxen Hives   Pnu-Imune [Pneumococcal Polysaccharide Vaccine] Rash   Sulfonamide Derivatives Rash    Objective:   BP 125/71  AAOx3, NAD NCAT, EOMI No obvious CN deficits Coloring WNL Pt is able to speak clearly, coherently without shortness of breath or increased work of breathing.  + wet, hacking cough Thought process is linear.  Mood is appropriate.   Assessment and Plan:   Acute cough- sputum has changed from clear to green and cough has worsened.  Has hx of PNA and was recently hospitalized w/ hypoxic respiratory failure.  Will treat  rather than wait.  Start Doxycycline '100mg'$  BID x7 days.  Reviewed supportive care and red flags that should prompt return.  Pt expressed understanding and is in agreement w/ plan.    [name], MD 06/01/2022

## 2022-06-01 NOTE — Telephone Encounter (Signed)
Left patient a detailed voice message to return call to office for mychart video visit.

## 2022-06-01 NOTE — Patient Outreach (Signed)
  Care Coordination   Initial Visit Note   06/01/2022 Name: [name] MRN: [id number] DOB: 1971-01-04  [name] is a 52 y.o. year old female who sees [name], Aundra Millet, MD for primary care. I spoke with  [name] by phone today.  What matters to the patients health and wellness today?  No needs    Goals Addressed               This Visit's Progress     COMPLETED: Care coordination activity (pt-stated)        Care Coordination Interventions: Reviewed medications with patient and discussed adherence with no needed refills Reviewed scheduled/upcoming provider appointments including sufficient transportation source Screening for signs and symptoms of depression related to chronic disease state  Assessed social determinant of health barriers Educated on care management services with no needs presented today.         SDOH assessments and interventions completed:  Yes  SDOH Interventions Today    Flowsheet Row Most Recent Value  SDOH Interventions   Food Insecurity Interventions Intervention Not Indicated  Housing Interventions Intervention Not Indicated  Transportation Interventions Intervention Not Indicated  Utilities Interventions Intervention Not Indicated        Care Coordination Interventions:  Yes, provided   Follow up plan: No further intervention required.   Encounter Outcome:  Pt. Visit Completed    Raina Mina, RN Care Management Coordinator [place] Office [phone]

## 2022-06-01 NOTE — Telephone Encounter (Signed)
Patient called stating she is needing a letter from Dr Birdie Riddle  stating that her son takes care of her and is her 24/7 caregiver. His name is Julian Reil and it can be faxed to [phone]

## 2022-06-02 DIAGNOSIS — K219 Gastro-esophageal reflux disease without esophagitis: Secondary | ICD-10-CM | POA: Diagnosis not present

## 2022-06-02 DIAGNOSIS — F32A Depression, unspecified: Secondary | ICD-10-CM | POA: Diagnosis not present

## 2022-06-02 DIAGNOSIS — M4802 Spinal stenosis, cervical region: Secondary | ICD-10-CM | POA: Diagnosis not present

## 2022-06-02 DIAGNOSIS — I872 Venous insufficiency (chronic) (peripheral): Secondary | ICD-10-CM | POA: Diagnosis not present

## 2022-06-02 DIAGNOSIS — E785 Hyperlipidemia, unspecified: Secondary | ICD-10-CM | POA: Diagnosis not present

## 2022-06-02 DIAGNOSIS — G629 Polyneuropathy, unspecified: Secondary | ICD-10-CM | POA: Diagnosis not present

## 2022-06-02 DIAGNOSIS — Z9181 History of falling: Secondary | ICD-10-CM | POA: Diagnosis not present

## 2022-06-02 DIAGNOSIS — M797 Fibromyalgia: Secondary | ICD-10-CM | POA: Diagnosis not present

## 2022-06-02 DIAGNOSIS — G9589 Other specified diseases of spinal cord: Secondary | ICD-10-CM | POA: Diagnosis not present

## 2022-06-02 DIAGNOSIS — Z993 Dependence on wheelchair: Secondary | ICD-10-CM | POA: Diagnosis not present

## 2022-06-02 DIAGNOSIS — L409 Psoriasis, unspecified: Secondary | ICD-10-CM | POA: Diagnosis not present

## 2022-06-02 DIAGNOSIS — E538 Deficiency of other specified B group vitamins: Secondary | ICD-10-CM | POA: Diagnosis not present

## 2022-06-02 DIAGNOSIS — G4733 Obstructive sleep apnea (adult) (pediatric): Secondary | ICD-10-CM | POA: Diagnosis not present

## 2022-06-02 DIAGNOSIS — M300 Polyarteritis nodosa: Secondary | ICD-10-CM | POA: Diagnosis not present

## 2022-06-02 DIAGNOSIS — K760 Fatty (change of) liver, not elsewhere classified: Secondary | ICD-10-CM | POA: Diagnosis not present

## 2022-06-02 DIAGNOSIS — M5136 Other intervertebral disc degeneration, lumbar region: Secondary | ICD-10-CM | POA: Diagnosis not present

## 2022-06-02 DIAGNOSIS — Z79891 Long term (current) use of opiate analgesic: Secondary | ICD-10-CM | POA: Diagnosis not present

## 2022-06-02 DIAGNOSIS — G9529 Other cord compression: Secondary | ICD-10-CM | POA: Diagnosis not present

## 2022-06-02 DIAGNOSIS — Z79899 Other long term (current) drug therapy: Secondary | ICD-10-CM | POA: Diagnosis not present

## 2022-06-02 DIAGNOSIS — E559 Vitamin D deficiency, unspecified: Secondary | ICD-10-CM | POA: Diagnosis not present

## 2022-06-02 DIAGNOSIS — G43909 Migraine, unspecified, not intractable, without status migrainosus: Secondary | ICD-10-CM | POA: Diagnosis not present

## 2022-06-02 DIAGNOSIS — I10 Essential (primary) hypertension: Secondary | ICD-10-CM | POA: Diagnosis not present

## 2022-06-02 DIAGNOSIS — Z87440 Personal history of urinary (tract) infections: Secondary | ICD-10-CM | POA: Diagnosis not present

## 2022-06-03 ENCOUNTER — Telehealth: Payer: Self-pay | Admitting: Family Medicine

## 2022-06-03 DIAGNOSIS — K219 Gastro-esophageal reflux disease without esophagitis: Secondary | ICD-10-CM | POA: Diagnosis not present

## 2022-06-03 DIAGNOSIS — Z9181 History of falling: Secondary | ICD-10-CM | POA: Diagnosis not present

## 2022-06-03 DIAGNOSIS — E559 Vitamin D deficiency, unspecified: Secondary | ICD-10-CM | POA: Diagnosis not present

## 2022-06-03 DIAGNOSIS — I872 Venous insufficiency (chronic) (peripheral): Secondary | ICD-10-CM | POA: Diagnosis not present

## 2022-06-03 DIAGNOSIS — M4802 Spinal stenosis, cervical region: Secondary | ICD-10-CM | POA: Diagnosis not present

## 2022-06-03 DIAGNOSIS — F32A Depression, unspecified: Secondary | ICD-10-CM | POA: Diagnosis not present

## 2022-06-03 DIAGNOSIS — M300 Polyarteritis nodosa: Secondary | ICD-10-CM | POA: Diagnosis not present

## 2022-06-03 DIAGNOSIS — K760 Fatty (change of) liver, not elsewhere classified: Secondary | ICD-10-CM | POA: Diagnosis not present

## 2022-06-03 DIAGNOSIS — G9529 Other cord compression: Secondary | ICD-10-CM | POA: Diagnosis not present

## 2022-06-03 DIAGNOSIS — E538 Deficiency of other specified B group vitamins: Secondary | ICD-10-CM | POA: Diagnosis not present

## 2022-06-03 DIAGNOSIS — Z79891 Long term (current) use of opiate analgesic: Secondary | ICD-10-CM | POA: Diagnosis not present

## 2022-06-03 DIAGNOSIS — Z79899 Other long term (current) drug therapy: Secondary | ICD-10-CM | POA: Diagnosis not present

## 2022-06-03 DIAGNOSIS — G4733 Obstructive sleep apnea (adult) (pediatric): Secondary | ICD-10-CM | POA: Diagnosis not present

## 2022-06-03 DIAGNOSIS — Z87440 Personal history of urinary (tract) infections: Secondary | ICD-10-CM | POA: Diagnosis not present

## 2022-06-03 DIAGNOSIS — M797 Fibromyalgia: Secondary | ICD-10-CM | POA: Diagnosis not present

## 2022-06-03 DIAGNOSIS — M5136 Other intervertebral disc degeneration, lumbar region: Secondary | ICD-10-CM | POA: Diagnosis not present

## 2022-06-03 DIAGNOSIS — L409 Psoriasis, unspecified: Secondary | ICD-10-CM | POA: Diagnosis not present

## 2022-06-03 DIAGNOSIS — G629 Polyneuropathy, unspecified: Secondary | ICD-10-CM | POA: Diagnosis not present

## 2022-06-03 DIAGNOSIS — G9589 Other specified diseases of spinal cord: Secondary | ICD-10-CM | POA: Diagnosis not present

## 2022-06-03 DIAGNOSIS — E785 Hyperlipidemia, unspecified: Secondary | ICD-10-CM | POA: Diagnosis not present

## 2022-06-03 DIAGNOSIS — Z993 Dependence on wheelchair: Secondary | ICD-10-CM | POA: Diagnosis not present

## 2022-06-03 DIAGNOSIS — G43909 Migraine, unspecified, not intractable, without status migrainosus: Secondary | ICD-10-CM | POA: Diagnosis not present

## 2022-06-03 DIAGNOSIS — I10 Essential (primary) hypertension: Secondary | ICD-10-CM | POA: Diagnosis not present

## 2022-06-03 NOTE — Telephone Encounter (Signed)
Received "for review" home health forms from [facility] no charge sheet attached please advise next steps if needed.  

## 2022-06-03 NOTE — Telephone Encounter (Signed)
Placed forms in Dr Birdie Riddle to be reviewed folder

## 2022-06-06 ENCOUNTER — Ambulatory Visit: Payer: Medicare Other | Admitting: Obstetrics & Gynecology

## 2022-06-06 DIAGNOSIS — K760 Fatty (change of) liver, not elsewhere classified: Secondary | ICD-10-CM | POA: Diagnosis not present

## 2022-06-06 DIAGNOSIS — F32A Depression, unspecified: Secondary | ICD-10-CM | POA: Diagnosis not present

## 2022-06-06 DIAGNOSIS — M4802 Spinal stenosis, cervical region: Secondary | ICD-10-CM | POA: Diagnosis not present

## 2022-06-06 DIAGNOSIS — G9529 Other cord compression: Secondary | ICD-10-CM | POA: Diagnosis not present

## 2022-06-06 DIAGNOSIS — K219 Gastro-esophageal reflux disease without esophagitis: Secondary | ICD-10-CM | POA: Diagnosis not present

## 2022-06-06 DIAGNOSIS — I872 Venous insufficiency (chronic) (peripheral): Secondary | ICD-10-CM | POA: Diagnosis not present

## 2022-06-06 DIAGNOSIS — E559 Vitamin D deficiency, unspecified: Secondary | ICD-10-CM | POA: Diagnosis not present

## 2022-06-06 DIAGNOSIS — G9589 Other specified diseases of spinal cord: Secondary | ICD-10-CM | POA: Diagnosis not present

## 2022-06-06 DIAGNOSIS — Z9181 History of falling: Secondary | ICD-10-CM | POA: Diagnosis not present

## 2022-06-06 DIAGNOSIS — M300 Polyarteritis nodosa: Secondary | ICD-10-CM | POA: Diagnosis not present

## 2022-06-06 DIAGNOSIS — Z79891 Long term (current) use of opiate analgesic: Secondary | ICD-10-CM | POA: Diagnosis not present

## 2022-06-06 DIAGNOSIS — E538 Deficiency of other specified B group vitamins: Secondary | ICD-10-CM | POA: Diagnosis not present

## 2022-06-06 DIAGNOSIS — Z993 Dependence on wheelchair: Secondary | ICD-10-CM | POA: Diagnosis not present

## 2022-06-06 DIAGNOSIS — Z79899 Other long term (current) drug therapy: Secondary | ICD-10-CM | POA: Diagnosis not present

## 2022-06-06 DIAGNOSIS — I10 Essential (primary) hypertension: Secondary | ICD-10-CM | POA: Diagnosis not present

## 2022-06-06 DIAGNOSIS — M5136 Other intervertebral disc degeneration, lumbar region: Secondary | ICD-10-CM | POA: Diagnosis not present

## 2022-06-06 DIAGNOSIS — L409 Psoriasis, unspecified: Secondary | ICD-10-CM | POA: Diagnosis not present

## 2022-06-06 DIAGNOSIS — M797 Fibromyalgia: Secondary | ICD-10-CM | POA: Diagnosis not present

## 2022-06-06 DIAGNOSIS — G4733 Obstructive sleep apnea (adult) (pediatric): Secondary | ICD-10-CM | POA: Diagnosis not present

## 2022-06-06 DIAGNOSIS — G43909 Migraine, unspecified, not intractable, without status migrainosus: Secondary | ICD-10-CM | POA: Diagnosis not present

## 2022-06-06 DIAGNOSIS — E785 Hyperlipidemia, unspecified: Secondary | ICD-10-CM | POA: Diagnosis not present

## 2022-06-06 DIAGNOSIS — G629 Polyneuropathy, unspecified: Secondary | ICD-10-CM | POA: Diagnosis not present

## 2022-06-06 DIAGNOSIS — Z87440 Personal history of urinary (tract) infections: Secondary | ICD-10-CM | POA: Diagnosis not present

## 2022-06-07 ENCOUNTER — Telehealth: Payer: Self-pay | Admitting: Family Medicine

## 2022-06-07 DIAGNOSIS — G4733 Obstructive sleep apnea (adult) (pediatric): Secondary | ICD-10-CM | POA: Diagnosis not present

## 2022-06-07 DIAGNOSIS — E538 Deficiency of other specified B group vitamins: Secondary | ICD-10-CM | POA: Diagnosis not present

## 2022-06-07 DIAGNOSIS — G629 Polyneuropathy, unspecified: Secondary | ICD-10-CM | POA: Diagnosis not present

## 2022-06-07 DIAGNOSIS — M797 Fibromyalgia: Secondary | ICD-10-CM | POA: Diagnosis not present

## 2022-06-07 DIAGNOSIS — E871 Hypo-osmolality and hyponatremia: Secondary | ICD-10-CM | POA: Diagnosis not present

## 2022-06-07 DIAGNOSIS — K760 Fatty (change of) liver, not elsewhere classified: Secondary | ICD-10-CM | POA: Diagnosis not present

## 2022-06-07 DIAGNOSIS — Z79891 Long term (current) use of opiate analgesic: Secondary | ICD-10-CM | POA: Diagnosis not present

## 2022-06-07 DIAGNOSIS — M300 Polyarteritis nodosa: Secondary | ICD-10-CM | POA: Diagnosis not present

## 2022-06-07 DIAGNOSIS — I872 Venous insufficiency (chronic) (peripheral): Secondary | ICD-10-CM | POA: Diagnosis not present

## 2022-06-07 DIAGNOSIS — M5136 Other intervertebral disc degeneration, lumbar region: Secondary | ICD-10-CM | POA: Diagnosis not present

## 2022-06-07 DIAGNOSIS — F32A Depression, unspecified: Secondary | ICD-10-CM | POA: Diagnosis not present

## 2022-06-07 DIAGNOSIS — E785 Hyperlipidemia, unspecified: Secondary | ICD-10-CM | POA: Diagnosis not present

## 2022-06-07 DIAGNOSIS — Z79899 Other long term (current) drug therapy: Secondary | ICD-10-CM | POA: Diagnosis not present

## 2022-06-07 DIAGNOSIS — L409 Psoriasis, unspecified: Secondary | ICD-10-CM | POA: Diagnosis not present

## 2022-06-07 DIAGNOSIS — I10 Essential (primary) hypertension: Secondary | ICD-10-CM | POA: Diagnosis not present

## 2022-06-07 DIAGNOSIS — M4802 Spinal stenosis, cervical region: Secondary | ICD-10-CM | POA: Diagnosis not present

## 2022-06-07 DIAGNOSIS — K219 Gastro-esophageal reflux disease without esophagitis: Secondary | ICD-10-CM | POA: Diagnosis not present

## 2022-06-07 DIAGNOSIS — G9529 Other cord compression: Secondary | ICD-10-CM | POA: Diagnosis not present

## 2022-06-07 DIAGNOSIS — G43909 Migraine, unspecified, not intractable, without status migrainosus: Secondary | ICD-10-CM | POA: Diagnosis not present

## 2022-06-07 DIAGNOSIS — Z993 Dependence on wheelchair: Secondary | ICD-10-CM | POA: Diagnosis not present

## 2022-06-07 DIAGNOSIS — G9589 Other specified diseases of spinal cord: Secondary | ICD-10-CM | POA: Diagnosis not present

## 2022-06-07 DIAGNOSIS — E559 Vitamin D deficiency, unspecified: Secondary | ICD-10-CM | POA: Diagnosis not present

## 2022-06-07 NOTE — Telephone Encounter (Signed)
Caller name: Lorna Few from [place]?: NO   Call back number: [phone]  Provider they see: [name], MD  Reason for call: verbal order: PT twice a week for four weeks,starting 06/07/2022.

## 2022-06-07 NOTE — Telephone Encounter (Signed)
Is It ok to give verbal order for PT twice a week for 4 weeks ,

## 2022-06-08 DIAGNOSIS — L409 Psoriasis, unspecified: Secondary | ICD-10-CM | POA: Diagnosis not present

## 2022-06-08 DIAGNOSIS — Z993 Dependence on wheelchair: Secondary | ICD-10-CM | POA: Diagnosis not present

## 2022-06-08 DIAGNOSIS — I872 Venous insufficiency (chronic) (peripheral): Secondary | ICD-10-CM | POA: Diagnosis not present

## 2022-06-08 DIAGNOSIS — G4733 Obstructive sleep apnea (adult) (pediatric): Secondary | ICD-10-CM | POA: Diagnosis not present

## 2022-06-08 DIAGNOSIS — E559 Vitamin D deficiency, unspecified: Secondary | ICD-10-CM | POA: Diagnosis not present

## 2022-06-08 DIAGNOSIS — G9589 Other specified diseases of spinal cord: Secondary | ICD-10-CM | POA: Diagnosis not present

## 2022-06-08 DIAGNOSIS — I10 Essential (primary) hypertension: Secondary | ICD-10-CM | POA: Diagnosis not present

## 2022-06-08 DIAGNOSIS — Z79891 Long term (current) use of opiate analgesic: Secondary | ICD-10-CM | POA: Diagnosis not present

## 2022-06-08 DIAGNOSIS — E538 Deficiency of other specified B group vitamins: Secondary | ICD-10-CM | POA: Diagnosis not present

## 2022-06-08 DIAGNOSIS — M5136 Other intervertebral disc degeneration, lumbar region: Secondary | ICD-10-CM | POA: Diagnosis not present

## 2022-06-08 DIAGNOSIS — E871 Hypo-osmolality and hyponatremia: Secondary | ICD-10-CM | POA: Diagnosis not present

## 2022-06-08 DIAGNOSIS — G43909 Migraine, unspecified, not intractable, without status migrainosus: Secondary | ICD-10-CM | POA: Diagnosis not present

## 2022-06-08 DIAGNOSIS — K219 Gastro-esophageal reflux disease without esophagitis: Secondary | ICD-10-CM | POA: Diagnosis not present

## 2022-06-08 DIAGNOSIS — M300 Polyarteritis nodosa: Secondary | ICD-10-CM | POA: Diagnosis not present

## 2022-06-08 DIAGNOSIS — G9529 Other cord compression: Secondary | ICD-10-CM | POA: Diagnosis not present

## 2022-06-08 DIAGNOSIS — G629 Polyneuropathy, unspecified: Secondary | ICD-10-CM | POA: Diagnosis not present

## 2022-06-08 DIAGNOSIS — Z79899 Other long term (current) drug therapy: Secondary | ICD-10-CM | POA: Diagnosis not present

## 2022-06-08 DIAGNOSIS — K760 Fatty (change of) liver, not elsewhere classified: Secondary | ICD-10-CM | POA: Diagnosis not present

## 2022-06-08 DIAGNOSIS — F32A Depression, unspecified: Secondary | ICD-10-CM | POA: Diagnosis not present

## 2022-06-08 DIAGNOSIS — E785 Hyperlipidemia, unspecified: Secondary | ICD-10-CM | POA: Diagnosis not present

## 2022-06-08 DIAGNOSIS — M797 Fibromyalgia: Secondary | ICD-10-CM | POA: Diagnosis not present

## 2022-06-08 DIAGNOSIS — M4802 Spinal stenosis, cervical region: Secondary | ICD-10-CM | POA: Diagnosis not present

## 2022-06-08 NOTE — Telephone Encounter (Signed)
Ok for verbal order to proceed 

## 2022-06-08 NOTE — Telephone Encounter (Signed)
Spoke to [place] from [employer] . I advised per Dr Birdie Riddle pt can have PT twice a week for four weeks .

## 2022-06-09 ENCOUNTER — Institutional Professional Consult (permissible substitution) (HOSPITAL_BASED_OUTPATIENT_CLINIC_OR_DEPARTMENT_OTHER): Payer: Medicare Other | Admitting: Pulmonary Disease

## 2022-06-10 ENCOUNTER — Telehealth: Payer: Self-pay

## 2022-06-10 DIAGNOSIS — G9529 Other cord compression: Secondary | ICD-10-CM | POA: Diagnosis not present

## 2022-06-10 DIAGNOSIS — E559 Vitamin D deficiency, unspecified: Secondary | ICD-10-CM | POA: Diagnosis not present

## 2022-06-10 DIAGNOSIS — G43909 Migraine, unspecified, not intractable, without status migrainosus: Secondary | ICD-10-CM | POA: Diagnosis not present

## 2022-06-10 DIAGNOSIS — Z79891 Long term (current) use of opiate analgesic: Secondary | ICD-10-CM | POA: Diagnosis not present

## 2022-06-10 DIAGNOSIS — M4802 Spinal stenosis, cervical region: Secondary | ICD-10-CM | POA: Diagnosis not present

## 2022-06-10 DIAGNOSIS — F32A Depression, unspecified: Secondary | ICD-10-CM | POA: Diagnosis not present

## 2022-06-10 DIAGNOSIS — E538 Deficiency of other specified B group vitamins: Secondary | ICD-10-CM | POA: Diagnosis not present

## 2022-06-10 DIAGNOSIS — Z993 Dependence on wheelchair: Secondary | ICD-10-CM | POA: Diagnosis not present

## 2022-06-10 DIAGNOSIS — K760 Fatty (change of) liver, not elsewhere classified: Secondary | ICD-10-CM | POA: Diagnosis not present

## 2022-06-10 DIAGNOSIS — G629 Polyneuropathy, unspecified: Secondary | ICD-10-CM | POA: Diagnosis not present

## 2022-06-10 DIAGNOSIS — Z79899 Other long term (current) drug therapy: Secondary | ICD-10-CM | POA: Diagnosis not present

## 2022-06-10 DIAGNOSIS — G9589 Other specified diseases of spinal cord: Secondary | ICD-10-CM | POA: Diagnosis not present

## 2022-06-10 DIAGNOSIS — E871 Hypo-osmolality and hyponatremia: Secondary | ICD-10-CM | POA: Diagnosis not present

## 2022-06-10 DIAGNOSIS — E785 Hyperlipidemia, unspecified: Secondary | ICD-10-CM | POA: Diagnosis not present

## 2022-06-10 DIAGNOSIS — M300 Polyarteritis nodosa: Secondary | ICD-10-CM | POA: Diagnosis not present

## 2022-06-10 DIAGNOSIS — I872 Venous insufficiency (chronic) (peripheral): Secondary | ICD-10-CM | POA: Diagnosis not present

## 2022-06-10 DIAGNOSIS — I10 Essential (primary) hypertension: Secondary | ICD-10-CM | POA: Diagnosis not present

## 2022-06-10 DIAGNOSIS — K219 Gastro-esophageal reflux disease without esophagitis: Secondary | ICD-10-CM | POA: Diagnosis not present

## 2022-06-10 DIAGNOSIS — G4733 Obstructive sleep apnea (adult) (pediatric): Secondary | ICD-10-CM | POA: Diagnosis not present

## 2022-06-10 DIAGNOSIS — M5136 Other intervertebral disc degeneration, lumbar region: Secondary | ICD-10-CM | POA: Diagnosis not present

## 2022-06-10 DIAGNOSIS — L409 Psoriasis, unspecified: Secondary | ICD-10-CM | POA: Diagnosis not present

## 2022-06-10 DIAGNOSIS — M797 Fibromyalgia: Secondary | ICD-10-CM | POA: Diagnosis not present

## 2022-06-10 NOTE — Telephone Encounter (Signed)
[facility] sent forms to be signed . Placed in Dr Birdie Riddle to be signed folder .

## 2022-06-13 ENCOUNTER — Other Ambulatory Visit: Payer: Self-pay | Admitting: Family Medicine

## 2022-06-13 DIAGNOSIS — E785 Hyperlipidemia, unspecified: Secondary | ICD-10-CM | POA: Diagnosis not present

## 2022-06-13 DIAGNOSIS — I872 Venous insufficiency (chronic) (peripheral): Secondary | ICD-10-CM | POA: Diagnosis not present

## 2022-06-13 DIAGNOSIS — Z993 Dependence on wheelchair: Secondary | ICD-10-CM | POA: Diagnosis not present

## 2022-06-13 DIAGNOSIS — M797 Fibromyalgia: Secondary | ICD-10-CM | POA: Diagnosis not present

## 2022-06-13 DIAGNOSIS — G9589 Other specified diseases of spinal cord: Secondary | ICD-10-CM | POA: Diagnosis not present

## 2022-06-13 DIAGNOSIS — F32A Depression, unspecified: Secondary | ICD-10-CM | POA: Diagnosis not present

## 2022-06-13 DIAGNOSIS — M5136 Other intervertebral disc degeneration, lumbar region: Secondary | ICD-10-CM | POA: Diagnosis not present

## 2022-06-13 DIAGNOSIS — M300 Polyarteritis nodosa: Secondary | ICD-10-CM | POA: Diagnosis not present

## 2022-06-13 DIAGNOSIS — E538 Deficiency of other specified B group vitamins: Secondary | ICD-10-CM | POA: Diagnosis not present

## 2022-06-13 DIAGNOSIS — G9529 Other cord compression: Secondary | ICD-10-CM | POA: Diagnosis not present

## 2022-06-13 DIAGNOSIS — G43909 Migraine, unspecified, not intractable, without status migrainosus: Secondary | ICD-10-CM | POA: Diagnosis not present

## 2022-06-13 DIAGNOSIS — K760 Fatty (change of) liver, not elsewhere classified: Secondary | ICD-10-CM | POA: Diagnosis not present

## 2022-06-13 DIAGNOSIS — K219 Gastro-esophageal reflux disease without esophagitis: Secondary | ICD-10-CM | POA: Diagnosis not present

## 2022-06-13 DIAGNOSIS — E559 Vitamin D deficiency, unspecified: Secondary | ICD-10-CM | POA: Diagnosis not present

## 2022-06-13 DIAGNOSIS — G4733 Obstructive sleep apnea (adult) (pediatric): Secondary | ICD-10-CM | POA: Diagnosis not present

## 2022-06-13 DIAGNOSIS — Z79899 Other long term (current) drug therapy: Secondary | ICD-10-CM | POA: Diagnosis not present

## 2022-06-13 DIAGNOSIS — E871 Hypo-osmolality and hyponatremia: Secondary | ICD-10-CM | POA: Diagnosis not present

## 2022-06-13 DIAGNOSIS — G629 Polyneuropathy, unspecified: Secondary | ICD-10-CM | POA: Diagnosis not present

## 2022-06-13 DIAGNOSIS — I10 Essential (primary) hypertension: Secondary | ICD-10-CM | POA: Diagnosis not present

## 2022-06-13 DIAGNOSIS — L409 Psoriasis, unspecified: Secondary | ICD-10-CM | POA: Diagnosis not present

## 2022-06-13 DIAGNOSIS — M4802 Spinal stenosis, cervical region: Secondary | ICD-10-CM | POA: Diagnosis not present

## 2022-06-13 DIAGNOSIS — Z79891 Long term (current) use of opiate analgesic: Secondary | ICD-10-CM | POA: Diagnosis not present

## 2022-06-13 MED ORDER — HYDROCODONE-ACETAMINOPHEN 10-325 MG PO TABS: 1.0000 | ORAL_TABLET | Freq: Three times a day (TID) | ORAL | 0 refills | Status: DC | PRN

## 2022-06-13 NOTE — Telephone Encounter (Signed)
Hydrocodone 10-325 mg LOV: 06/01/22 Last Refill:05/17/22 Upcoming appt: none

## 2022-06-13 NOTE — Telephone Encounter (Signed)
Left VM stating Rx has been sent in  

## 2022-06-14 ENCOUNTER — Telehealth: Payer: Self-pay | Admitting: Family Medicine

## 2022-06-14 DIAGNOSIS — G4733 Obstructive sleep apnea (adult) (pediatric): Secondary | ICD-10-CM | POA: Diagnosis not present

## 2022-06-14 DIAGNOSIS — F32A Depression, unspecified: Secondary | ICD-10-CM | POA: Diagnosis not present

## 2022-06-14 DIAGNOSIS — Z79899 Other long term (current) drug therapy: Secondary | ICD-10-CM | POA: Diagnosis not present

## 2022-06-14 DIAGNOSIS — M4802 Spinal stenosis, cervical region: Secondary | ICD-10-CM | POA: Diagnosis not present

## 2022-06-14 DIAGNOSIS — G43909 Migraine, unspecified, not intractable, without status migrainosus: Secondary | ICD-10-CM | POA: Diagnosis not present

## 2022-06-14 DIAGNOSIS — K219 Gastro-esophageal reflux disease without esophagitis: Secondary | ICD-10-CM | POA: Diagnosis not present

## 2022-06-14 DIAGNOSIS — M5136 Other intervertebral disc degeneration, lumbar region: Secondary | ICD-10-CM | POA: Diagnosis not present

## 2022-06-14 DIAGNOSIS — E785 Hyperlipidemia, unspecified: Secondary | ICD-10-CM | POA: Diagnosis not present

## 2022-06-14 DIAGNOSIS — G9589 Other specified diseases of spinal cord: Secondary | ICD-10-CM | POA: Diagnosis not present

## 2022-06-14 DIAGNOSIS — M300 Polyarteritis nodosa: Secondary | ICD-10-CM | POA: Diagnosis not present

## 2022-06-14 DIAGNOSIS — I10 Essential (primary) hypertension: Secondary | ICD-10-CM | POA: Diagnosis not present

## 2022-06-14 DIAGNOSIS — E538 Deficiency of other specified B group vitamins: Secondary | ICD-10-CM | POA: Diagnosis not present

## 2022-06-14 DIAGNOSIS — L409 Psoriasis, unspecified: Secondary | ICD-10-CM | POA: Diagnosis not present

## 2022-06-14 DIAGNOSIS — M797 Fibromyalgia: Secondary | ICD-10-CM | POA: Diagnosis not present

## 2022-06-14 DIAGNOSIS — G629 Polyneuropathy, unspecified: Secondary | ICD-10-CM | POA: Diagnosis not present

## 2022-06-14 DIAGNOSIS — Z993 Dependence on wheelchair: Secondary | ICD-10-CM | POA: Diagnosis not present

## 2022-06-14 DIAGNOSIS — E559 Vitamin D deficiency, unspecified: Secondary | ICD-10-CM | POA: Diagnosis not present

## 2022-06-14 DIAGNOSIS — Z79891 Long term (current) use of opiate analgesic: Secondary | ICD-10-CM | POA: Diagnosis not present

## 2022-06-14 DIAGNOSIS — K760 Fatty (change of) liver, not elsewhere classified: Secondary | ICD-10-CM | POA: Diagnosis not present

## 2022-06-14 DIAGNOSIS — I872 Venous insufficiency (chronic) (peripheral): Secondary | ICD-10-CM | POA: Diagnosis not present

## 2022-06-14 DIAGNOSIS — G9529 Other cord compression: Secondary | ICD-10-CM | POA: Diagnosis not present

## 2022-06-14 DIAGNOSIS — E871 Hypo-osmolality and hyponatremia: Secondary | ICD-10-CM | POA: Diagnosis not present

## 2022-06-14 NOTE — Telephone Encounter (Signed)
[name] from [facility] states Pt [name] DOB:[birth year]  Has lingering cough 3-4 weeks. Pt is taking Mucenix and sputum is greenish in color and when coughing a little SOB.

## 2022-06-14 NOTE — Telephone Encounter (Signed)
Pt notes cough continued from last week it is better but not gone, notes mucinex helps some but still producing a lot of mucous is there anything we can advise or should we do a new visit?

## 2022-06-14 NOTE — Telephone Encounter (Signed)
At this time, I would recommend a visit to be evaluated as she is medically fragile and can become very ill rather quickly

## 2022-06-14 NOTE — Telephone Encounter (Signed)
Scheduled

## 2022-06-15 ENCOUNTER — Encounter: Payer: Self-pay | Admitting: Family Medicine

## 2022-06-15 ENCOUNTER — Other Ambulatory Visit: Payer: Self-pay

## 2022-06-15 ENCOUNTER — Telehealth (INDEPENDENT_AMBULATORY_CARE_PROVIDER_SITE_OTHER): Payer: Medicaid Other | Admitting: Family Medicine

## 2022-06-15 DIAGNOSIS — R052 Subacute cough: Secondary | ICD-10-CM | POA: Diagnosis not present

## 2022-06-15 DIAGNOSIS — Z1211 Encounter for screening for malignant neoplasm of colon: Secondary | ICD-10-CM

## 2022-06-15 MED ORDER — AZITHROMYCIN 250 MG PO TABS: ORAL_TABLET | ORAL | 0 refills | Status: DC

## 2022-06-15 NOTE — Progress Notes (Signed)
Virtual Visit via Video   I connected with patient on 06/15/22 at 11:00 AM EST by a video enabled telemedicine application and verified that I am speaking with the correct person using two identifiers.  Location patient: Home Location provider: [facility] Persons participating in the virtual visit: Patient, Provider, [name] C)  I discussed the limitations of evaluation and management by telemedicine and the availability of in person appointments. The patient expressed understanding and agreed to proceed.  Subjective:   HPI:   Cough/Congestion- 'i still have a little bit of a cough'.  Cough is productive of green sputum when she takes the Mucinex.  No fevers.  Some maxillary sinus pressure.  Denies body aches or chills.  Mild SOB after coughing.  Nurse listened to lungs yesterday and reported they were clear.  Taking Zyrtec daily.  Cough started before Christmas  ROS:   See pertinent positives and negatives per HPI.  Patient Active Problem List   Diagnosis Date Noted   Elevated LFTs 05/06/2022   Obesity hypoventilation syndrome ([place]) 05/06/2022   Acute respiratory failure with hypoxia (HCC) 04/22/2022   Hypokalemia 04/22/2022   Leg cramps 01/12/2022   CAP (community acquired pneumonia) 08/12/2021   Fever of unknown origin 08/12/2021   Sacral pressure sore 07/06/2021   Vitamin B6 deficiency 06/08/2021   Myelomalacia of cervical cord ([place]) 06/06/2021   Compressive cervical cord myelomalacia ([place]) 06/05/2021   B12 deficiency 06/05/2021   Folate deficiency 06/05/2021   Vitamin D deficiency 06/05/2021   Leg weakness, bilateral 06/04/2021   Cervical atypia 01/06/2021   Steatosis of liver 01/06/2021   Hyponatremia 07/27/2019   Sepsis ([place]) 07/26/2019   Urinary urgency 04/04/2018   Chronic narcotic use 09/07/2016   Acute blood loss anemia 01/14/2016   Multiple gastric ulcers    PAN (polyarteritis nodosa) ([place]) 11/24/2015   GERD (gastroesophageal reflux  disease) 01/08/2014   Routine general medical examination at a health care facility 04/23/2013   Bariatric surgery status 10/31/2012   S/P skin biopsy 10/31/2012   Psoriasis 01/09/2012   DDD (degenerative disc disease), lumbar 11/30/2011   Migraine headache    [name] (obstructive sleep apnea) 11/16/2010   HYPERGLYCEMIA, FASTING 10/08/2009   CONTRACTURE OF TENDON 08/17/2009   PARESTHESIA, HANDS 12/23/2008   Leg pain, right 11/05/2008   ADVERSE DRUG REACTION 04/03/2008   PERIPHERAL EDEMA 03/26/2008   Obesity, morbid, BMI 50 or higher ([place]) 01/03/2007   Cellulitis 01/03/2007   Hyperlipidemia 09/22/2006   Fibromyalgia 09/20/2006   Anxiety and depression 06/28/2006   Essential hypertension 06/28/2006    Social History   Tobacco Use   Smoking status: Never   Smokeless tobacco: Never  Substance Use Topics   Alcohol use: No    Current Outpatient Medications:    ALPRAZolam (XANAX) 0.5 MG tablet, TAKE 1 TABLET BY MOUTH 2 TIMES DAILY, Disp: 30 tablet, Rfl: 3   ARIPiprazole (ABILIFY) 15 MG tablet, TAKE 1 TABLET BY MOUTH EVERY DAY, Disp: 90 tablet, Rfl: 1   Armodafinil 250 MG tablet, Take 250 mg by mouth in the morning and at bedtime., Disp: , Rfl:    Biotin 10 MG TABS, Take 10 mg by mouth in the morning. , Disp: , Rfl:    buPROPion (WELLBUTRIN XL) 300 MG 24 hr tablet, TAKE 1 TABLET BY MOUTH EVERY DAY (Patient taking differently: Take 300 mg by mouth daily.), Disp: 30 tablet, Rfl: 2   calcium citrate (CALCITRATE - DOSED IN MG ELEMENTAL CALCIUM) 950 MG tablet, Take 1 tablet  by mouth daily., Disp: , Rfl:    clonazePAM (KLONOPIN) 1 MG tablet, Take 1 mg by mouth at bedtime., Disp: , Rfl:    cyanocobalamin (VITAMIN B12) 1000 MCG tablet, TAKE 1 TABLET BY MOUTH EVERY DAY, Disp: 30 tablet, Rfl: 2   cyclobenzaprine (FLEXERIL) 10 MG tablet, TAKE 2 TABLETS BY MOUTH 2 TIMES DAILY, Disp: 120 tablet, Rfl: 2   diclofenac Sodium (VOLTAREN) 1 % GEL, Apply 4 g topically 4 (four) times daily., Disp: , Rfl:     DULoxetine (CYMBALTA) 60 MG capsule, Take 2 capsules (120 mg total) by mouth daily. (Patient taking differently: Take 60 mg by mouth 2 (two) times daily.), Disp: 240 capsule, Rfl: 2   FEROSUL 325 (65 Fe) MG tablet, TAKE 1 TABLET BY MOUTH EVERY DAY WITH BREAKFAST (Patient taking differently: Take 325 mg by mouth daily.), Disp: 30 tablet, Rfl: 2   folic acid (FOLVITE) 1 MG tablet, TAKE 1 TABLET BY MOUTH EVERY DAY (Patient taking differently: Take 1 mg by mouth daily.), Disp: 30 tablet, Rfl: 2   gabapentin (NEURONTIN) 600 MG tablet, Take 1 tablet (600 mg total) by mouth 3 (three) times daily. TAKE 2 TABLETS BY MOUTH 3 TIMES DAILY (Patient taking differently: Take 600 mg by mouth 3 (three) times daily.), Disp: 270 tablet, Rfl: 0   GNP VITAMIN B-1 100 MG tablet, TAKE 1 TABLET BY MOUTH EVERY DAY, Disp: 30 tablet, Rfl: 2   HYDROcodone-acetaminophen (NORCO) 10-325 MG tablet, Take 1 tablet by mouth every 8 (eight) hours as needed., Disp: 90 tablet, Rfl: 0   nebivolol (BYSTOLIC) 10 MG tablet, TAKE 1 TABLET BY MOUTH EVERY DAY (Patient taking differently: Take 10 mg by mouth daily.), Disp: 30 tablet, Rfl: 2   nystatin (MYCOSTATIN/NYSTOP) powder, APPLY 1 APPLICATION TOPICALLY 3 TIMES DAILY, Disp: 60 g, Rfl: 3   Oxcarbazepine (TRILEPTAL) 300 MG tablet, Take 300 mg by mouth 2 (two) times daily., Disp: , Rfl:    pantoprazole (PROTONIX) 40 MG tablet, TAKE 1 TABLET BY MOUTH 2 TIMES DAILY, Disp: 60 tablet, Rfl: 2   promethazine (PHENERGAN) 25 MG tablet, TAKE 1 TABLET BY MOUTH EVERY 6 HOURS AS NEEDED FOR NAUSEA AND VOMITING (Patient taking differently: Take 25 mg by mouth every 6 (six) hours as needed for vomiting or nausea.), Disp: 45 tablet, Rfl: 3   pyridOXINE (VITAMIN B6) 100 MG tablet, TAKE 1 TABLET BY MOUTH EVERY DAY (Patient taking differently: Take 100 mg by mouth daily.), Disp: 30 tablet, Rfl: 2   SKYRIZI 150 MG/ML SOSY, Inject 150 mg into the skin every 3 (three) months., Disp: , Rfl:    doxycycline (VIBRA-TABS)  100 MG tablet, Take 1 tablet (100 mg total) by mouth 2 (two) times daily. (Patient not taking: Reported on 06/15/2022), Disp: 14 tablet, Rfl: 0  Allergies  Allergen Reactions   Niacin Anaphylaxis, Shortness Of Breath and Swelling    Swelling and "problems breathing"   Sulfamethoxazole-Trimethoprim Anaphylaxis, Swelling, Rash and Other (See Comments)    Throat closed and the eyes became swollen   Aspirin Hives   Bactrim Hives   Benzoin Compound Rash   Cephalexin Hives    Tolerated ceftriaxone and cefepime 07/2019   Iohexol Hives    Pt treated with PO benedryl   Lisinopril Hives   Naproxen Hives   Pnu-Imune [Pneumococcal Polysaccharide Vaccine] Rash   Sulfonamide Derivatives Rash    Objective:   There were no vitals taken for this visit. AAOx3, NAD, obese NCAT, EOMI No obvious CN deficits pale Pt is able  to speak clearly, coherently without shortness of breath or increased work of breathing.  Wet cough Thought process is linear.  Mood is appropriate.   Assessment and Plan:   Subacute cough- pt has had ongoing cough since December.  [place] RN reports lungs are clear but pt is having sputum production and SOB.  Will start Zpack in case of atypical infxn.  Reviewed supportive care and red flags that should prompt return.  Pt expressed understanding and is in agreement w/ plan.    [name], MD 06/15/2022

## 2022-06-16 DIAGNOSIS — G43909 Migraine, unspecified, not intractable, without status migrainosus: Secondary | ICD-10-CM | POA: Diagnosis not present

## 2022-06-16 DIAGNOSIS — M4802 Spinal stenosis, cervical region: Secondary | ICD-10-CM | POA: Diagnosis not present

## 2022-06-16 DIAGNOSIS — Z993 Dependence on wheelchair: Secondary | ICD-10-CM | POA: Diagnosis not present

## 2022-06-16 DIAGNOSIS — M5136 Other intervertebral disc degeneration, lumbar region: Secondary | ICD-10-CM | POA: Diagnosis not present

## 2022-06-16 DIAGNOSIS — E559 Vitamin D deficiency, unspecified: Secondary | ICD-10-CM | POA: Diagnosis not present

## 2022-06-16 DIAGNOSIS — E785 Hyperlipidemia, unspecified: Secondary | ICD-10-CM | POA: Diagnosis not present

## 2022-06-16 DIAGNOSIS — E538 Deficiency of other specified B group vitamins: Secondary | ICD-10-CM | POA: Diagnosis not present

## 2022-06-16 DIAGNOSIS — Z79899 Other long term (current) drug therapy: Secondary | ICD-10-CM | POA: Diagnosis not present

## 2022-06-16 DIAGNOSIS — K219 Gastro-esophageal reflux disease without esophagitis: Secondary | ICD-10-CM | POA: Diagnosis not present

## 2022-06-16 DIAGNOSIS — G4733 Obstructive sleep apnea (adult) (pediatric): Secondary | ICD-10-CM | POA: Diagnosis not present

## 2022-06-16 DIAGNOSIS — Z79891 Long term (current) use of opiate analgesic: Secondary | ICD-10-CM | POA: Diagnosis not present

## 2022-06-16 DIAGNOSIS — M797 Fibromyalgia: Secondary | ICD-10-CM | POA: Diagnosis not present

## 2022-06-16 DIAGNOSIS — K760 Fatty (change of) liver, not elsewhere classified: Secondary | ICD-10-CM | POA: Diagnosis not present

## 2022-06-16 DIAGNOSIS — G9529 Other cord compression: Secondary | ICD-10-CM | POA: Diagnosis not present

## 2022-06-16 DIAGNOSIS — M300 Polyarteritis nodosa: Secondary | ICD-10-CM | POA: Diagnosis not present

## 2022-06-16 DIAGNOSIS — G9589 Other specified diseases of spinal cord: Secondary | ICD-10-CM | POA: Diagnosis not present

## 2022-06-16 DIAGNOSIS — I10 Essential (primary) hypertension: Secondary | ICD-10-CM | POA: Diagnosis not present

## 2022-06-16 DIAGNOSIS — I872 Venous insufficiency (chronic) (peripheral): Secondary | ICD-10-CM | POA: Diagnosis not present

## 2022-06-16 DIAGNOSIS — F32A Depression, unspecified: Secondary | ICD-10-CM | POA: Diagnosis not present

## 2022-06-16 DIAGNOSIS — E871 Hypo-osmolality and hyponatremia: Secondary | ICD-10-CM | POA: Diagnosis not present

## 2022-06-16 DIAGNOSIS — G629 Polyneuropathy, unspecified: Secondary | ICD-10-CM | POA: Diagnosis not present

## 2022-06-16 DIAGNOSIS — L409 Psoriasis, unspecified: Secondary | ICD-10-CM | POA: Diagnosis not present

## 2022-06-17 ENCOUNTER — Telehealth: Payer: Self-pay

## 2022-06-17 NOTE — Telephone Encounter (Signed)
Patient called checking on the status of the notes for her scooter. Spoke to Dr Birdie Riddle and she is still waiting on the info she needs from PT, stated that she would send what she already had but was concerned it will get denied because it isn't exactly what she needs. Rhondalyn stated that PT is coming Monday and wanted to know exactly what it was that is needed. I let her know that Dr Birdie Riddle had left for the day, confirmed that PT was coming Monday around 12-1230p and I would get this message to Dr Birdie Riddle to see if we can get what we need on Monday from the PT. I let Leverne know that I would be calling her Monday morning.

## 2022-06-20 DIAGNOSIS — G9589 Other specified diseases of spinal cord: Secondary | ICD-10-CM | POA: Diagnosis not present

## 2022-06-20 DIAGNOSIS — Z993 Dependence on wheelchair: Secondary | ICD-10-CM | POA: Diagnosis not present

## 2022-06-20 DIAGNOSIS — Z79899 Other long term (current) drug therapy: Secondary | ICD-10-CM | POA: Diagnosis not present

## 2022-06-20 DIAGNOSIS — M4802 Spinal stenosis, cervical region: Secondary | ICD-10-CM | POA: Diagnosis not present

## 2022-06-20 DIAGNOSIS — K760 Fatty (change of) liver, not elsewhere classified: Secondary | ICD-10-CM | POA: Diagnosis not present

## 2022-06-20 DIAGNOSIS — Z79891 Long term (current) use of opiate analgesic: Secondary | ICD-10-CM | POA: Diagnosis not present

## 2022-06-20 DIAGNOSIS — G4733 Obstructive sleep apnea (adult) (pediatric): Secondary | ICD-10-CM | POA: Diagnosis not present

## 2022-06-20 DIAGNOSIS — E871 Hypo-osmolality and hyponatremia: Secondary | ICD-10-CM | POA: Diagnosis not present

## 2022-06-20 DIAGNOSIS — K219 Gastro-esophageal reflux disease without esophagitis: Secondary | ICD-10-CM | POA: Diagnosis not present

## 2022-06-20 DIAGNOSIS — F32A Depression, unspecified: Secondary | ICD-10-CM | POA: Diagnosis not present

## 2022-06-20 DIAGNOSIS — L409 Psoriasis, unspecified: Secondary | ICD-10-CM | POA: Diagnosis not present

## 2022-06-20 DIAGNOSIS — M5136 Other intervertebral disc degeneration, lumbar region: Secondary | ICD-10-CM | POA: Diagnosis not present

## 2022-06-20 DIAGNOSIS — E559 Vitamin D deficiency, unspecified: Secondary | ICD-10-CM | POA: Diagnosis not present

## 2022-06-20 DIAGNOSIS — I10 Essential (primary) hypertension: Secondary | ICD-10-CM | POA: Diagnosis not present

## 2022-06-20 DIAGNOSIS — G43909 Migraine, unspecified, not intractable, without status migrainosus: Secondary | ICD-10-CM | POA: Diagnosis not present

## 2022-06-20 DIAGNOSIS — I872 Venous insufficiency (chronic) (peripheral): Secondary | ICD-10-CM | POA: Diagnosis not present

## 2022-06-20 DIAGNOSIS — G629 Polyneuropathy, unspecified: Secondary | ICD-10-CM | POA: Diagnosis not present

## 2022-06-20 DIAGNOSIS — E538 Deficiency of other specified B group vitamins: Secondary | ICD-10-CM | POA: Diagnosis not present

## 2022-06-20 DIAGNOSIS — M797 Fibromyalgia: Secondary | ICD-10-CM | POA: Diagnosis not present

## 2022-06-20 DIAGNOSIS — E785 Hyperlipidemia, unspecified: Secondary | ICD-10-CM | POA: Diagnosis not present

## 2022-06-20 DIAGNOSIS — G9529 Other cord compression: Secondary | ICD-10-CM | POA: Diagnosis not present

## 2022-06-20 DIAGNOSIS — M300 Polyarteritis nodosa: Secondary | ICD-10-CM | POA: Diagnosis not present

## 2022-06-20 NOTE — Telephone Encounter (Signed)
There is a very specific set of measurements required for wheelchair/scooter assessment.  In fact, the order from [facility] and [facility] indicates she requires a formal PT/OT rehab seating clinic evaluation.  This typically involves objective (numerical- not just 'normal' or 'limited') ROM measurements of upper and lower extremities, strength measurements of upper and lower extremities, etc.  I will include 2 separate links to forms indicating how extensive this evaluation must be.  [email or web address]  [email or web address]

## 2022-06-21 ENCOUNTER — Ambulatory Visit (INDEPENDENT_AMBULATORY_CARE_PROVIDER_SITE_OTHER): Payer: 59 | Admitting: Pulmonary Disease

## 2022-06-21 ENCOUNTER — Encounter (HOSPITAL_BASED_OUTPATIENT_CLINIC_OR_DEPARTMENT_OTHER): Payer: Self-pay | Admitting: Pulmonary Disease

## 2022-06-21 VITALS — BP 130/80 | HR 75 | Ht 62.0 in

## 2022-06-21 DIAGNOSIS — G4734 Idiopathic sleep related nonobstructive alveolar hypoventilation: Secondary | ICD-10-CM | POA: Diagnosis not present

## 2022-06-21 DIAGNOSIS — G4733 Obstructive sleep apnea (adult) (pediatric): Secondary | ICD-10-CM | POA: Diagnosis not present

## 2022-06-21 NOTE — Progress Notes (Signed)
Subjective:   PATIENT ID: [name] GENDER: female DOB: [birth year], MRN: [id number]  Chief Complaint  Patient presents with   Consult    Hasn't walked much since nov 2022    Reason for Visit: New consult for need for new CPAP  [name] is a 52 year old female never smoker with bedbound status, morbid obesity, myelomalacia of cervical cord, [name], HTN, HLD, fibromyalgia, GERD who presents for [name]. Son present and provides additional history.  She was previously followed by Dr. Elsworth Soho at [facility] with last visit with NP Volanda Napoleon on 07/10/19. She was recently hospitalized in December 2023 after being found down and treated with sepsis secondary to pneumonia. Did not need to be on ventilator.  She presents for renewal of CPAP and to re-establish care. She currently sleeps in a hospital bed and is reclined. Previously did not tolerate her CPAP mask but was able to wear it during her recent hospital stay for more than 4 hours. Son reports she gasps for air at night. She is short of breath at times and is non ambulatory at baseline. Denies wheezing or coughing.  Social History: Never smoker  I have personally reviewed patient's past medical/family/social history, allergies, current medications.  Past Medical History:  Diagnosis Date   Allergic rhinitis    Ankle fracture, right    Anxiety    ASCUS of cervix with negative high risk HPV 05/2018   Carpal tunnel syndrome    Cervical cancer (HCC) 1998   Fibromyalgia    Hyperlipidemia    Hypertension    Migraine headache    Obesity    Sleep apnea      Family History  Problem Relation Age of Onset   Diabetes Mother    Emphysema Mother    Allergies Mother    Hypertension Mother    Heart disease Father    Allergies Father    Prostate cancer Father    Breast cancer Maternal Grandmother 80     Social History   Occupational History   Occupation: disabled. 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)  Tobacco Use   Smoking status:  Never   Smokeless tobacco: Never  Vaping Use   Vaping Use: Never used  Substance and Sexual Activity   Alcohol use: No   Drug use: No   Sexual activity: Yes    Comment: TAH-1st intercourse 17 yo-5 partners    Allergies  Allergen Reactions   Niacin Anaphylaxis, Shortness Of Breath and Swelling    Swelling and "problems breathing"   Sulfamethoxazole-Trimethoprim Anaphylaxis, Swelling, Rash and Other (See Comments)    Throat closed and the eyes became swollen   Aspirin Hives   Bactrim Hives   Benzoin Compound Rash   Cephalexin Hives    Tolerated ceftriaxone and cefepime 07/2019   Iohexol Hives    Pt treated with PO benedryl   Lisinopril Hives   Naproxen Hives   Pnu-Imune [Pneumococcal Polysaccharide Vaccine] Rash   Sulfonamide Derivatives Rash     Outpatient Medications Prior to Visit  Medication Sig Dispense Refill   ALPRAZolam (XANAX) 0.5 MG tablet TAKE 1 TABLET BY MOUTH 2 TIMES DAILY 30 tablet 3   ARIPiprazole (ABILIFY) 15 MG tablet TAKE 1 TABLET BY MOUTH EVERY DAY 90 tablet 1   Armodafinil 250 MG tablet Take 250 mg by mouth in the morning and at bedtime.     Biotin 10 MG TABS Take 10 mg by mouth in the morning.      buPROPion (  WELLBUTRIN XL) 300 MG 24 hr tablet TAKE 1 TABLET BY MOUTH EVERY DAY (Patient taking differently: Take 300 mg by mouth daily.) 30 tablet 2   calcium citrate (CALCITRATE - DOSED IN MG ELEMENTAL CALCIUM) 950 MG tablet Take 1 tablet by mouth daily.     clonazePAM (KLONOPIN) 1 MG tablet Take 1 mg by mouth at bedtime.     cyanocobalamin (VITAMIN B12) 1000 MCG tablet TAKE 1 TABLET BY MOUTH EVERY DAY 30 tablet 2   cyclobenzaprine (FLEXERIL) 10 MG tablet TAKE 2 TABLETS BY MOUTH 2 TIMES DAILY 120 tablet 2   diclofenac Sodium (VOLTAREN) 1 % GEL Apply 4 g topically 4 (four) times daily.     DULoxetine (CYMBALTA) 60 MG capsule Take 2 capsules (120 mg total) by mouth daily. (Patient taking differently: Take 60 mg by mouth 2 (two) times daily.) 240 capsule 2    FEROSUL 325 (65 Fe) MG tablet TAKE 1 TABLET BY MOUTH EVERY DAY WITH BREAKFAST (Patient taking differently: Take 325 mg by mouth daily.) 30 tablet 2   folic acid (FOLVITE) 1 MG tablet TAKE 1 TABLET BY MOUTH EVERY DAY (Patient taking differently: Take 1 mg by mouth daily.) 30 tablet 2   gabapentin (NEURONTIN) 600 MG tablet Take 1 tablet (600 mg total) by mouth 3 (three) times daily. TAKE 2 TABLETS BY MOUTH 3 TIMES DAILY (Patient taking differently: Take 600 mg by mouth 3 (three) times daily.) 270 tablet 0   GNP VITAMIN B-1 100 MG tablet TAKE 1 TABLET BY MOUTH EVERY DAY 30 tablet 2   HYDROcodone-acetaminophen (NORCO) 10-325 MG tablet Take 1 tablet by mouth every 8 (eight) hours as needed. 90 tablet 0   nebivolol (BYSTOLIC) 10 MG tablet TAKE 1 TABLET BY MOUTH EVERY DAY (Patient taking differently: Take 10 mg by mouth daily.) 30 tablet 2   nystatin (MYCOSTATIN/NYSTOP) powder APPLY 1 APPLICATION TOPICALLY 3 TIMES DAILY 60 g 3   Oxcarbazepine (TRILEPTAL) 300 MG tablet Take 300 mg by mouth 2 (two) times daily.     pantoprazole (PROTONIX) 40 MG tablet TAKE 1 TABLET BY MOUTH 2 TIMES DAILY 60 tablet 2   promethazine (PHENERGAN) 25 MG tablet TAKE 1 TABLET BY MOUTH EVERY 6 HOURS AS NEEDED FOR NAUSEA AND VOMITING (Patient taking differently: Take 25 mg by mouth every 6 (six) hours as needed for vomiting or nausea.) 45 tablet 3   pyridOXINE (VITAMIN B6) 100 MG tablet TAKE 1 TABLET BY MOUTH EVERY DAY (Patient taking differently: Take 100 mg by mouth daily.) 30 tablet 2   SKYRIZI 150 MG/ML SOSY Inject 150 mg into the skin every 3 (three) months.     azithromycin (ZITHROMAX) 250 MG tablet 2 tabs on day 1, 1 tab on day 2-5 6 tablet 0   doxycycline (VIBRA-TABS) 100 MG tablet Take 1 tablet (100 mg total) by mouth 2 (two) times daily. 14 tablet 0   No facility-administered medications prior to visit.    Review of Systems  Constitutional:  Negative for chills, diaphoresis, fever, malaise/fatigue and weight loss.  HENT:   Negative for congestion.   Respiratory:  Positive for shortness of breath. Negative for cough, hemoptysis, sputum production and wheezing.   Cardiovascular:  Negative for chest pain, palpitations and leg swelling.     Objective:   Vitals:   06/21/22 1454  BP: 130/80  Pulse: 75  SpO2: 96%  Height: '5\' 2"'$  (1.575 m)   SpO2: 96 % O2 Device: None (Room air)  Physical Exam: General: Morbidly obese, well-appearing, no  acute distress HENT: [place], AT Eyes: EOMI, no scleral icterus Respiratory: Clear to auscultation bilaterally.  No crackles, wheezing or rales Cardiovascular: RRR, -M/R/G, no JVD Extremities:-Edema,-tenderness Neuro: AAO x4, CNII-XII grossly intact Psych: Normal mood, normal affect  Data Reviewed:  Imaging: CTA 04/21/22 - No PE. Bilateral GGO, bibasilar atelectasis. No effusion or edema.  PFT: 02/21/11 FVC 2.45 (72%) FEV1 2.21 (84%) Ratio 87  TLC 80% DLCO 51% Interpretation: No obstructive or restrictive defect. Moderately reduced DLCO  Labs: CBC    Component Value Date/Time   WBC 9.9 05/06/2022 1150   RBC 4.68 05/06/2022 1150   HGB 14.0 05/06/2022 1150   HGB 13.9 11/03/2011 1036   HCT 42.3 05/06/2022 1150   HCT 42.8 11/03/2011 1036   PLT 335 05/06/2022 1150   PLT 271 11/03/2011 1036   MCV 90.4 05/06/2022 1150   MCV 89 11/03/2011 1036   MCH 29.9 05/06/2022 1150   MCHC 33.1 05/06/2022 1150   RDW 12.9 05/06/2022 1150   RDW 14.6 11/03/2011 1036   LYMPHSABS 1,307 05/06/2022 1150   LYMPHSABS 2.8 11/03/2011 1036   MONOABS 0.5 01/12/2022 1440   EOSABS 426 05/06/2022 1150   EOSABS 0.4 11/03/2011 1036   BASOSABS 69 05/06/2022 1150   BASOSABS 0.0 11/03/2011 1036   Abso eos 05/06/22 - 426  Sleep: 01/16/19 - Slept in recliner during study. Mild [name]. AHI 7.6/h Nadir SpO2 84%. Mean SpO2 94%     Assessment & Plan:   Discussion: 52 year old female never smoker with bedbound status, morbid obesity, myelomalacia of cervical cord, [name], HTN, HLD, fibromyalgia, GERD  who presents for [name]. Not currently on CPAP but tolerated during her recent hospitalization in 04/2022. Counseled on [name] and CPAP compliance. Will likely need full face mask but if not tolerated will consider mask desensitization referral vs nasal prongs + chin strap.  [name] History of nocturnal hypoxemia likely 2/2 [name] --ORDER autoCPAP 5-20 cm H20 and supplies. New start. --ORDER overnight oxygen on room air  Health Maintenance Immunization History  Administered Date(s) Administered   Influenza Split 03/17/2011   Influenza Whole 02/15/2010   Influenza,inj,Quad PF,6+ Mos 05/17/2015, 02/23/2016, 02/13/2017, 01/18/2019, 02/10/2021, 01/12/2022   Influenza-Unspecified 02/27/2013, 01/11/2018, 03/10/2020   PFIZER(Purple Top)SARS-COV-2 Vaccination 08/15/2019, 09/09/2019, 01/31/2020   Pfizer Covid-19 Vaccine Bivalent Booster 25yr & up 03/11/2021   Pneumococcal Conjugate-13 10/04/2017   Pneumococcal Polysaccharide-23 03/17/2011, 01/10/2018   Td 05/16/2002   Td (Adult), 2 Lf Tetanus Toxid, Preservative Free 05/16/2002   Tdap 07/24/2018   CT Lung Screen - never smoker. Not qualified  Orders Placed This Encounter  Procedures   Ambulatory Referral for DME    Referral Priority:   Routine    Referral Type:   Durable Medical Equipment Purchase    Number of Visits Requested:   1   Pulse oximetry, overnight    Standing Status:   Future    Standing Expiration Date:   06/22/2023    Scheduling Instructions:     Ovo on room air  No orders of the defined types were placed in this encounter.   Return in about 3 months (around 09/19/2022).  I have spent a total time of 45-minutes on the day of the appointment reviewing prior documentation, coordinating care and discussing medical diagnosis and plan with the patient/family. Imaging, labs and tests included in this note have been reviewed and interpreted independently by me.  CEast Franklin MD [facility] 06/21/2022 5:01 PM  Office  Number [phone]

## 2022-06-21 NOTE — Patient Instructions (Signed)
[name] History of nocturnal hypoxemia likely 2/2 [name] --ORDER autoCPAP 5-20 cm H20 and supplies. New start. --ORDER overnight oxygen on room air. Please call our office to let us know when results are completed  Follow-up with me in 3 months

## 2022-06-21 NOTE — Telephone Encounter (Signed)
Forms faxed and placed in scan  

## 2022-06-22 DIAGNOSIS — K760 Fatty (change of) liver, not elsewhere classified: Secondary | ICD-10-CM | POA: Diagnosis not present

## 2022-06-22 DIAGNOSIS — I872 Venous insufficiency (chronic) (peripheral): Secondary | ICD-10-CM | POA: Diagnosis not present

## 2022-06-22 DIAGNOSIS — G4733 Obstructive sleep apnea (adult) (pediatric): Secondary | ICD-10-CM | POA: Diagnosis not present

## 2022-06-22 DIAGNOSIS — K219 Gastro-esophageal reflux disease without esophagitis: Secondary | ICD-10-CM | POA: Diagnosis not present

## 2022-06-22 DIAGNOSIS — Z79891 Long term (current) use of opiate analgesic: Secondary | ICD-10-CM | POA: Diagnosis not present

## 2022-06-22 DIAGNOSIS — Z79899 Other long term (current) drug therapy: Secondary | ICD-10-CM | POA: Diagnosis not present

## 2022-06-22 DIAGNOSIS — M300 Polyarteritis nodosa: Secondary | ICD-10-CM | POA: Diagnosis not present

## 2022-06-22 DIAGNOSIS — G9589 Other specified diseases of spinal cord: Secondary | ICD-10-CM | POA: Diagnosis not present

## 2022-06-22 DIAGNOSIS — E785 Hyperlipidemia, unspecified: Secondary | ICD-10-CM | POA: Diagnosis not present

## 2022-06-22 DIAGNOSIS — G629 Polyneuropathy, unspecified: Secondary | ICD-10-CM | POA: Diagnosis not present

## 2022-06-22 DIAGNOSIS — E871 Hypo-osmolality and hyponatremia: Secondary | ICD-10-CM | POA: Diagnosis not present

## 2022-06-22 DIAGNOSIS — G43909 Migraine, unspecified, not intractable, without status migrainosus: Secondary | ICD-10-CM | POA: Diagnosis not present

## 2022-06-22 DIAGNOSIS — M797 Fibromyalgia: Secondary | ICD-10-CM | POA: Diagnosis not present

## 2022-06-22 DIAGNOSIS — M4802 Spinal stenosis, cervical region: Secondary | ICD-10-CM | POA: Diagnosis not present

## 2022-06-22 DIAGNOSIS — I10 Essential (primary) hypertension: Secondary | ICD-10-CM | POA: Diagnosis not present

## 2022-06-22 DIAGNOSIS — E559 Vitamin D deficiency, unspecified: Secondary | ICD-10-CM | POA: Diagnosis not present

## 2022-06-22 DIAGNOSIS — F32A Depression, unspecified: Secondary | ICD-10-CM | POA: Diagnosis not present

## 2022-06-22 DIAGNOSIS — E538 Deficiency of other specified B group vitamins: Secondary | ICD-10-CM | POA: Diagnosis not present

## 2022-06-22 DIAGNOSIS — M5136 Other intervertebral disc degeneration, lumbar region: Secondary | ICD-10-CM | POA: Diagnosis not present

## 2022-06-22 DIAGNOSIS — L409 Psoriasis, unspecified: Secondary | ICD-10-CM | POA: Diagnosis not present

## 2022-06-22 DIAGNOSIS — Z993 Dependence on wheelchair: Secondary | ICD-10-CM | POA: Diagnosis not present

## 2022-06-22 DIAGNOSIS — G9529 Other cord compression: Secondary | ICD-10-CM | POA: Diagnosis not present

## 2022-06-23 DIAGNOSIS — M797 Fibromyalgia: Secondary | ICD-10-CM | POA: Diagnosis not present

## 2022-06-23 DIAGNOSIS — G43909 Migraine, unspecified, not intractable, without status migrainosus: Secondary | ICD-10-CM | POA: Diagnosis not present

## 2022-06-23 DIAGNOSIS — Z79891 Long term (current) use of opiate analgesic: Secondary | ICD-10-CM | POA: Diagnosis not present

## 2022-06-23 DIAGNOSIS — E559 Vitamin D deficiency, unspecified: Secondary | ICD-10-CM | POA: Diagnosis not present

## 2022-06-23 DIAGNOSIS — I872 Venous insufficiency (chronic) (peripheral): Secondary | ICD-10-CM | POA: Diagnosis not present

## 2022-06-23 DIAGNOSIS — M300 Polyarteritis nodosa: Secondary | ICD-10-CM | POA: Diagnosis not present

## 2022-06-23 DIAGNOSIS — Z993 Dependence on wheelchair: Secondary | ICD-10-CM | POA: Diagnosis not present

## 2022-06-23 DIAGNOSIS — E785 Hyperlipidemia, unspecified: Secondary | ICD-10-CM | POA: Diagnosis not present

## 2022-06-23 DIAGNOSIS — G9589 Other specified diseases of spinal cord: Secondary | ICD-10-CM | POA: Diagnosis not present

## 2022-06-23 DIAGNOSIS — L409 Psoriasis, unspecified: Secondary | ICD-10-CM | POA: Diagnosis not present

## 2022-06-23 DIAGNOSIS — K219 Gastro-esophageal reflux disease without esophagitis: Secondary | ICD-10-CM | POA: Diagnosis not present

## 2022-06-23 DIAGNOSIS — I10 Essential (primary) hypertension: Secondary | ICD-10-CM | POA: Diagnosis not present

## 2022-06-23 DIAGNOSIS — G4733 Obstructive sleep apnea (adult) (pediatric): Secondary | ICD-10-CM | POA: Diagnosis not present

## 2022-06-23 DIAGNOSIS — G9529 Other cord compression: Secondary | ICD-10-CM | POA: Diagnosis not present

## 2022-06-23 DIAGNOSIS — Z79899 Other long term (current) drug therapy: Secondary | ICD-10-CM | POA: Diagnosis not present

## 2022-06-23 DIAGNOSIS — E538 Deficiency of other specified B group vitamins: Secondary | ICD-10-CM | POA: Diagnosis not present

## 2022-06-23 DIAGNOSIS — E871 Hypo-osmolality and hyponatremia: Secondary | ICD-10-CM | POA: Diagnosis not present

## 2022-06-23 DIAGNOSIS — K760 Fatty (change of) liver, not elsewhere classified: Secondary | ICD-10-CM | POA: Diagnosis not present

## 2022-06-23 DIAGNOSIS — G629 Polyneuropathy, unspecified: Secondary | ICD-10-CM | POA: Diagnosis not present

## 2022-06-23 DIAGNOSIS — M4802 Spinal stenosis, cervical region: Secondary | ICD-10-CM | POA: Diagnosis not present

## 2022-06-23 DIAGNOSIS — F32A Depression, unspecified: Secondary | ICD-10-CM | POA: Diagnosis not present

## 2022-06-23 DIAGNOSIS — M5136 Other intervertebral disc degeneration, lumbar region: Secondary | ICD-10-CM | POA: Diagnosis not present

## 2022-06-24 NOTE — Telephone Encounter (Signed)
Followed up with patient after PT eval, she states that PT came out but they only did their regular workup. Her PT told her that a different person would have to do the eval because they weren't certified to do so. She stated that she would call the PT and ask if they were going to set up another eval and call me back

## 2022-06-27 ENCOUNTER — Telehealth: Payer: Self-pay | Admitting: Family Medicine

## 2022-06-27 DIAGNOSIS — G629 Polyneuropathy, unspecified: Secondary | ICD-10-CM | POA: Diagnosis not present

## 2022-06-27 DIAGNOSIS — Z79899 Other long term (current) drug therapy: Secondary | ICD-10-CM | POA: Diagnosis not present

## 2022-06-27 DIAGNOSIS — K760 Fatty (change of) liver, not elsewhere classified: Secondary | ICD-10-CM | POA: Diagnosis not present

## 2022-06-27 DIAGNOSIS — G9589 Other specified diseases of spinal cord: Secondary | ICD-10-CM | POA: Diagnosis not present

## 2022-06-27 DIAGNOSIS — M797 Fibromyalgia: Secondary | ICD-10-CM | POA: Diagnosis not present

## 2022-06-27 DIAGNOSIS — E559 Vitamin D deficiency, unspecified: Secondary | ICD-10-CM | POA: Diagnosis not present

## 2022-06-27 DIAGNOSIS — Z79891 Long term (current) use of opiate analgesic: Secondary | ICD-10-CM | POA: Diagnosis not present

## 2022-06-27 DIAGNOSIS — E871 Hypo-osmolality and hyponatremia: Secondary | ICD-10-CM | POA: Diagnosis not present

## 2022-06-27 DIAGNOSIS — K219 Gastro-esophageal reflux disease without esophagitis: Secondary | ICD-10-CM | POA: Diagnosis not present

## 2022-06-27 DIAGNOSIS — G4733 Obstructive sleep apnea (adult) (pediatric): Secondary | ICD-10-CM | POA: Diagnosis not present

## 2022-06-27 DIAGNOSIS — I872 Venous insufficiency (chronic) (peripheral): Secondary | ICD-10-CM | POA: Diagnosis not present

## 2022-06-27 DIAGNOSIS — M5136 Other intervertebral disc degeneration, lumbar region: Secondary | ICD-10-CM | POA: Diagnosis not present

## 2022-06-27 DIAGNOSIS — G43909 Migraine, unspecified, not intractable, without status migrainosus: Secondary | ICD-10-CM | POA: Diagnosis not present

## 2022-06-27 DIAGNOSIS — L409 Psoriasis, unspecified: Secondary | ICD-10-CM | POA: Diagnosis not present

## 2022-06-27 DIAGNOSIS — M300 Polyarteritis nodosa: Secondary | ICD-10-CM | POA: Diagnosis not present

## 2022-06-27 DIAGNOSIS — E785 Hyperlipidemia, unspecified: Secondary | ICD-10-CM | POA: Diagnosis not present

## 2022-06-27 DIAGNOSIS — F32A Depression, unspecified: Secondary | ICD-10-CM | POA: Diagnosis not present

## 2022-06-27 DIAGNOSIS — I10 Essential (primary) hypertension: Secondary | ICD-10-CM | POA: Diagnosis not present

## 2022-06-27 DIAGNOSIS — E538 Deficiency of other specified B group vitamins: Secondary | ICD-10-CM | POA: Diagnosis not present

## 2022-06-27 DIAGNOSIS — G9529 Other cord compression: Secondary | ICD-10-CM | POA: Diagnosis not present

## 2022-06-27 DIAGNOSIS — M4802 Spinal stenosis, cervical region: Secondary | ICD-10-CM | POA: Diagnosis not present

## 2022-06-27 DIAGNOSIS — Z993 Dependence on wheelchair: Secondary | ICD-10-CM | POA: Diagnosis not present

## 2022-06-27 NOTE — Telephone Encounter (Signed)
Caller name: NAILAH OCEJO  On DPR?: Yes  Call back number:  [phone]  Provider they see: [name], MD  Reason for call: Power wheelchair or scooter conversation. She stated she stated she spoke with Claiborne Billings.But she thought it was already talked about.

## 2022-06-27 NOTE — Telephone Encounter (Signed)
Spoke to Kief from home health and provided her with [employer] phone number. She stated that she would call them and see what they are needing. She stated typically these evals are done by a therapist that the company sends out. She stated if she needed anything else from [place] she would call

## 2022-06-28 ENCOUNTER — Telehealth: Payer: Self-pay

## 2022-06-28 NOTE — Telephone Encounter (Signed)
Patient called stating that Dr Birdie Riddle told her once she got back to moving around she would send in the phentermine. Patient wanted to know if that could go ahead and be sent in.

## 2022-06-29 ENCOUNTER — Telehealth: Payer: Self-pay

## 2022-06-29 DIAGNOSIS — M5136 Other intervertebral disc degeneration, lumbar region: Secondary | ICD-10-CM | POA: Diagnosis not present

## 2022-06-29 DIAGNOSIS — G9529 Other cord compression: Secondary | ICD-10-CM | POA: Diagnosis not present

## 2022-06-29 DIAGNOSIS — K219 Gastro-esophageal reflux disease without esophagitis: Secondary | ICD-10-CM | POA: Diagnosis not present

## 2022-06-29 DIAGNOSIS — E871 Hypo-osmolality and hyponatremia: Secondary | ICD-10-CM | POA: Diagnosis not present

## 2022-06-29 DIAGNOSIS — K760 Fatty (change of) liver, not elsewhere classified: Secondary | ICD-10-CM | POA: Diagnosis not present

## 2022-06-29 DIAGNOSIS — E559 Vitamin D deficiency, unspecified: Secondary | ICD-10-CM | POA: Diagnosis not present

## 2022-06-29 DIAGNOSIS — G629 Polyneuropathy, unspecified: Secondary | ICD-10-CM | POA: Diagnosis not present

## 2022-06-29 DIAGNOSIS — I10 Essential (primary) hypertension: Secondary | ICD-10-CM | POA: Diagnosis not present

## 2022-06-29 DIAGNOSIS — Z993 Dependence on wheelchair: Secondary | ICD-10-CM | POA: Diagnosis not present

## 2022-06-29 DIAGNOSIS — Z79899 Other long term (current) drug therapy: Secondary | ICD-10-CM | POA: Diagnosis not present

## 2022-06-29 DIAGNOSIS — L409 Psoriasis, unspecified: Secondary | ICD-10-CM | POA: Diagnosis not present

## 2022-06-29 DIAGNOSIS — F32A Depression, unspecified: Secondary | ICD-10-CM | POA: Diagnosis not present

## 2022-06-29 DIAGNOSIS — E785 Hyperlipidemia, unspecified: Secondary | ICD-10-CM | POA: Diagnosis not present

## 2022-06-29 DIAGNOSIS — G9589 Other specified diseases of spinal cord: Secondary | ICD-10-CM | POA: Diagnosis not present

## 2022-06-29 DIAGNOSIS — G4733 Obstructive sleep apnea (adult) (pediatric): Secondary | ICD-10-CM | POA: Diagnosis not present

## 2022-06-29 DIAGNOSIS — Z79891 Long term (current) use of opiate analgesic: Secondary | ICD-10-CM | POA: Diagnosis not present

## 2022-06-29 DIAGNOSIS — G43909 Migraine, unspecified, not intractable, without status migrainosus: Secondary | ICD-10-CM | POA: Diagnosis not present

## 2022-06-29 DIAGNOSIS — I872 Venous insufficiency (chronic) (peripheral): Secondary | ICD-10-CM | POA: Diagnosis not present

## 2022-06-29 DIAGNOSIS — M300 Polyarteritis nodosa: Secondary | ICD-10-CM | POA: Diagnosis not present

## 2022-06-29 DIAGNOSIS — M4802 Spinal stenosis, cervical region: Secondary | ICD-10-CM | POA: Diagnosis not present

## 2022-06-29 DIAGNOSIS — M797 Fibromyalgia: Secondary | ICD-10-CM | POA: Diagnosis not present

## 2022-06-29 DIAGNOSIS — E538 Deficiency of other specified B group vitamins: Secondary | ICD-10-CM | POA: Diagnosis not present

## 2022-06-29 NOTE — Telephone Encounter (Signed)
[facility] sent forms to be signed . Placed in Dr Birdie Riddle to be singed folder

## 2022-06-30 ENCOUNTER — Telehealth: Payer: Self-pay | Admitting: Pulmonary Disease

## 2022-06-30 DIAGNOSIS — G4733 Obstructive sleep apnea (adult) (pediatric): Secondary | ICD-10-CM

## 2022-06-30 MED ORDER — PHENTERMINE HCL 37.5 MG PO CAPS: 37.5000 mg | ORAL_CAPSULE | ORAL | 0 refills | Status: DC

## 2022-06-30 NOTE — Telephone Encounter (Signed)
Prescription sent to pharmacy.

## 2022-06-30 NOTE — Telephone Encounter (Signed)
Pt aware of the rx has been sent in

## 2022-06-30 NOTE — Telephone Encounter (Signed)
Called patient regarding DME request for repeat sleep study. She is agreeable.  Please order home sleep study

## 2022-06-30 NOTE — Telephone Encounter (Signed)
Order being voided by [employer]   Message below:    [name], [name],  I received the following message rom out intake review team ::   Can you please inform this rep/dr that we have to void this order. The pt will require a new DX sleep study since she stopped using her CPAP and wants to "re-establish CPAP  Thank you,  Demetrius Charity

## 2022-07-01 DIAGNOSIS — Z79899 Other long term (current) drug therapy: Secondary | ICD-10-CM | POA: Diagnosis not present

## 2022-07-01 DIAGNOSIS — I872 Venous insufficiency (chronic) (peripheral): Secondary | ICD-10-CM | POA: Diagnosis not present

## 2022-07-01 DIAGNOSIS — M300 Polyarteritis nodosa: Secondary | ICD-10-CM | POA: Diagnosis not present

## 2022-07-01 DIAGNOSIS — I10 Essential (primary) hypertension: Secondary | ICD-10-CM | POA: Diagnosis not present

## 2022-07-01 DIAGNOSIS — Z993 Dependence on wheelchair: Secondary | ICD-10-CM | POA: Diagnosis not present

## 2022-07-01 DIAGNOSIS — M5136 Other intervertebral disc degeneration, lumbar region: Secondary | ICD-10-CM | POA: Diagnosis not present

## 2022-07-01 DIAGNOSIS — E538 Deficiency of other specified B group vitamins: Secondary | ICD-10-CM | POA: Diagnosis not present

## 2022-07-01 DIAGNOSIS — G629 Polyneuropathy, unspecified: Secondary | ICD-10-CM | POA: Diagnosis not present

## 2022-07-01 DIAGNOSIS — E871 Hypo-osmolality and hyponatremia: Secondary | ICD-10-CM | POA: Diagnosis not present

## 2022-07-01 DIAGNOSIS — E559 Vitamin D deficiency, unspecified: Secondary | ICD-10-CM | POA: Diagnosis not present

## 2022-07-01 DIAGNOSIS — G9529 Other cord compression: Secondary | ICD-10-CM | POA: Diagnosis not present

## 2022-07-01 DIAGNOSIS — G4733 Obstructive sleep apnea (adult) (pediatric): Secondary | ICD-10-CM | POA: Diagnosis not present

## 2022-07-01 DIAGNOSIS — F32A Depression, unspecified: Secondary | ICD-10-CM | POA: Diagnosis not present

## 2022-07-01 DIAGNOSIS — G43909 Migraine, unspecified, not intractable, without status migrainosus: Secondary | ICD-10-CM | POA: Diagnosis not present

## 2022-07-01 DIAGNOSIS — M4802 Spinal stenosis, cervical region: Secondary | ICD-10-CM | POA: Diagnosis not present

## 2022-07-01 DIAGNOSIS — Z79891 Long term (current) use of opiate analgesic: Secondary | ICD-10-CM | POA: Diagnosis not present

## 2022-07-01 DIAGNOSIS — L409 Psoriasis, unspecified: Secondary | ICD-10-CM | POA: Diagnosis not present

## 2022-07-01 DIAGNOSIS — K760 Fatty (change of) liver, not elsewhere classified: Secondary | ICD-10-CM | POA: Diagnosis not present

## 2022-07-01 DIAGNOSIS — E785 Hyperlipidemia, unspecified: Secondary | ICD-10-CM | POA: Diagnosis not present

## 2022-07-01 DIAGNOSIS — M797 Fibromyalgia: Secondary | ICD-10-CM | POA: Diagnosis not present

## 2022-07-01 DIAGNOSIS — G9589 Other specified diseases of spinal cord: Secondary | ICD-10-CM | POA: Diagnosis not present

## 2022-07-01 DIAGNOSIS — K219 Gastro-esophageal reflux disease without esophagitis: Secondary | ICD-10-CM | POA: Diagnosis not present

## 2022-07-04 ENCOUNTER — Other Ambulatory Visit: Payer: Self-pay | Admitting: Family Medicine

## 2022-07-04 DIAGNOSIS — M300 Polyarteritis nodosa: Principal | ICD-10-CM

## 2022-07-04 DIAGNOSIS — G629 Polyneuropathy, unspecified: Secondary | ICD-10-CM | POA: Diagnosis not present

## 2022-07-04 DIAGNOSIS — Z79899 Other long term (current) drug therapy: Secondary | ICD-10-CM | POA: Diagnosis not present

## 2022-07-04 DIAGNOSIS — E871 Hypo-osmolality and hyponatremia: Secondary | ICD-10-CM | POA: Diagnosis not present

## 2022-07-04 DIAGNOSIS — G4733 Obstructive sleep apnea (adult) (pediatric): Secondary | ICD-10-CM | POA: Diagnosis not present

## 2022-07-04 DIAGNOSIS — G9529 Other cord compression: Secondary | ICD-10-CM | POA: Diagnosis not present

## 2022-07-04 DIAGNOSIS — F32A Depression, unspecified: Secondary | ICD-10-CM | POA: Diagnosis not present

## 2022-07-04 DIAGNOSIS — K760 Fatty (change of) liver, not elsewhere classified: Secondary | ICD-10-CM | POA: Diagnosis not present

## 2022-07-04 DIAGNOSIS — E785 Hyperlipidemia, unspecified: Secondary | ICD-10-CM | POA: Diagnosis not present

## 2022-07-04 DIAGNOSIS — K219 Gastro-esophageal reflux disease without esophagitis: Secondary | ICD-10-CM | POA: Diagnosis not present

## 2022-07-04 DIAGNOSIS — E559 Vitamin D deficiency, unspecified: Secondary | ICD-10-CM | POA: Diagnosis not present

## 2022-07-04 DIAGNOSIS — I10 Essential (primary) hypertension: Secondary | ICD-10-CM | POA: Diagnosis not present

## 2022-07-04 DIAGNOSIS — Z79891 Long term (current) use of opiate analgesic: Secondary | ICD-10-CM | POA: Diagnosis not present

## 2022-07-04 DIAGNOSIS — G43909 Migraine, unspecified, not intractable, without status migrainosus: Secondary | ICD-10-CM | POA: Diagnosis not present

## 2022-07-04 DIAGNOSIS — E538 Deficiency of other specified B group vitamins: Secondary | ICD-10-CM | POA: Diagnosis not present

## 2022-07-04 DIAGNOSIS — M4802 Spinal stenosis, cervical region: Secondary | ICD-10-CM | POA: Diagnosis not present

## 2022-07-04 DIAGNOSIS — G9589 Other specified diseases of spinal cord: Secondary | ICD-10-CM | POA: Diagnosis not present

## 2022-07-04 DIAGNOSIS — Z993 Dependence on wheelchair: Secondary | ICD-10-CM | POA: Diagnosis not present

## 2022-07-04 DIAGNOSIS — M797 Fibromyalgia: Secondary | ICD-10-CM | POA: Diagnosis not present

## 2022-07-04 DIAGNOSIS — I872 Venous insufficiency (chronic) (peripheral): Secondary | ICD-10-CM | POA: Diagnosis not present

## 2022-07-04 DIAGNOSIS — L409 Psoriasis, unspecified: Secondary | ICD-10-CM | POA: Diagnosis not present

## 2022-07-04 DIAGNOSIS — M5136 Other intervertebral disc degeneration, lumbar region: Secondary | ICD-10-CM | POA: Diagnosis not present

## 2022-07-04 MED ORDER — GABAPENTIN 600 MG TABLET
ORAL_TABLET | Freq: Three times a day (TID) | ORAL | 0 refills | 90 days | Status: CP
Start: 2022-07-04 — End: ?

## 2022-07-04 NOTE — Unmapped (Signed)
Gabapentin refill  Last ov: Visit date not found  Next ov: 07/14/2022

## 2022-07-04 NOTE — Telephone Encounter (Signed)
Forms faxed and placed in scan  

## 2022-07-04 NOTE — Telephone Encounter (Signed)
Please document where this went

## 2022-07-04 NOTE — Telephone Encounter (Signed)
Form signed and returned to [place] 

## 2022-07-04 NOTE — Telephone Encounter (Signed)
Forms completed for scooter and placed on [facility] desk this morning

## 2022-07-05 ENCOUNTER — Encounter: Payer: Self-pay | Admitting: Family Medicine

## 2022-07-05 ENCOUNTER — Telehealth (INDEPENDENT_AMBULATORY_CARE_PROVIDER_SITE_OTHER): Payer: Medicaid Other | Admitting: Family Medicine

## 2022-07-05 DIAGNOSIS — B9689 Other specified bacterial agents as the cause of diseases classified elsewhere: Secondary | ICD-10-CM | POA: Diagnosis not present

## 2022-07-05 DIAGNOSIS — J329 Chronic sinusitis, unspecified: Secondary | ICD-10-CM

## 2022-07-05 MED ORDER — ALBUTEROL SULFATE HFA 108 (90 BASE) MCG/ACT IN AERS: 2.0000 | INHALATION_SPRAY | Freq: Four times a day (QID) | RESPIRATORY_TRACT | 0 refills | Status: DC | PRN

## 2022-07-05 MED ORDER — AMOXICILLIN 875 MG PO TABS: 875.0000 mg | ORAL_TABLET | Freq: Two times a day (BID) | ORAL | 0 refills | Status: AC

## 2022-07-05 NOTE — Telephone Encounter (Signed)
Faxed to [employer], placed on [facility] desk for keeping in case needed

## 2022-07-05 NOTE — Telephone Encounter (Signed)
Home sleep study was reordered 07/01/22

## 2022-07-05 NOTE — Progress Notes (Signed)
Virtual Visit via Video   I connected with patient on 07/05/22 at 10:20 AM EST by a video enabled telemedicine application and verified that I am speaking with the correct person using two identifiers.  Location patient: Home Location provider: [facility], Office Persons participating in the virtual visit: Patient, Provider, Camas Marcille Blanco C)  I discussed the limitations of evaluation and management by telemedicine and the availability of in person appointments. The patient expressed understanding and agreed to proceed.  Subjective:   HPI:   Cough- pt reports cough started 2 days ago.  'a raspy cough'.  + maxillary sinus congestion and pressure.  + HA.  No fevers.  + PND.  + pain of upper gums.  ROS:   See pertinent positives and negatives per HPI.  Patient Active Problem List   Diagnosis Date Noted   Nocturnal hypoxemia 06/21/2022   Elevated LFTs 05/06/2022   Obesity hypoventilation syndrome ([place]) 05/06/2022   Acute respiratory failure with hypoxia (HCC) 04/22/2022   Hypokalemia 04/22/2022   Leg cramps 01/12/2022   CAP (community acquired pneumonia) 08/12/2021   Fever of unknown origin 08/12/2021   Sacral pressure sore 07/06/2021   Vitamin B6 deficiency 06/08/2021   Myelomalacia of cervical cord ([place]) 06/06/2021   Compressive cervical cord myelomalacia ([place]) 06/05/2021   B12 deficiency 06/05/2021   Folate deficiency 06/05/2021   Vitamin D deficiency 06/05/2021   Leg weakness, bilateral 06/04/2021   Cervical atypia 01/06/2021   Steatosis of liver 01/06/2021   Hyponatremia 07/27/2019   Sepsis ([place]) 07/26/2019   Urinary urgency 04/04/2018   Chronic narcotic use 09/07/2016   Acute blood loss anemia 01/14/2016   Multiple gastric ulcers    PAN (polyarteritis nodosa) ([place]) 11/24/2015   GERD (gastroesophageal reflux disease) 01/08/2014   Routine general medical examination at a health care facility 04/23/2013   Bariatric surgery status 10/31/2012   S/P skin  biopsy 10/31/2012   Psoriasis 01/09/2012   DDD (degenerative disc disease), lumbar 11/30/2011   Migraine headache    [name] (obstructive sleep apnea) 11/16/2010   HYPERGLYCEMIA, FASTING 10/08/2009   CONTRACTURE OF TENDON 08/17/2009   PARESTHESIA, HANDS 12/23/2008   Leg pain, right 11/05/2008   ADVERSE DRUG REACTION 04/03/2008   PERIPHERAL EDEMA 03/26/2008   Obesity, morbid, BMI 50 or higher ([place]) 01/03/2007   Cellulitis 01/03/2007   Hyperlipidemia 09/22/2006   Fibromyalgia 09/20/2006   Anxiety and depression 06/28/2006   Essential hypertension 06/28/2006    Social History   Tobacco Use   Smoking status: Never   Smokeless tobacco: Never  Substance Use Topics   Alcohol use: No    Current Outpatient Medications:    ALPRAZolam (XANAX) 0.5 MG tablet, TAKE 1 TABLET BY MOUTH 2 TIMES DAILY, Disp: 30 tablet, Rfl: 3   ARIPiprazole (ABILIFY) 15 MG tablet, TAKE 1 TABLET BY MOUTH EVERY DAY, Disp: 90 tablet, Rfl: 1   Armodafinil 250 MG tablet, Take 250 mg by mouth in the morning and at bedtime., Disp: , Rfl:    Biotin 10 MG TABS, Take 10 mg by mouth in the morning. , Disp: , Rfl:    buPROPion (WELLBUTRIN XL) 300 MG 24 hr tablet, TAKE 1 TABLET BY MOUTH EVERY DAY, Disp: 30 tablet, Rfl: 2   calcium citrate (CALCITRATE - DOSED IN MG ELEMENTAL CALCIUM) 950 MG tablet, Take 1 tablet by mouth daily., Disp: , Rfl:    clonazePAM (KLONOPIN) 1 MG tablet, Take 1 mg by mouth at bedtime., Disp: , Rfl:    cyanocobalamin (VITAMIN B12) 1000  MCG tablet, TAKE 1 TABLET BY MOUTH EVERY DAY, Disp: 30 tablet, Rfl: 2   cyclobenzaprine (FLEXERIL) 10 MG tablet, TAKE 2 TABLETS BY MOUTH 2 TIMES DAILY, Disp: 120 tablet, Rfl: 2   diclofenac Sodium (VOLTAREN) 1 % GEL, Apply 4 g topically 4 (four) times daily., Disp: , Rfl:    DULoxetine (CYMBALTA) 60 MG capsule, Take 2 capsules (120 mg total) by mouth daily. (Patient taking differently: Take 60 mg by mouth 2 (two) times daily.), Disp: 240 capsule, Rfl: 2   ferrous sulfate  (FEROSUL) 325 (65 FE) MG tablet, TAKE 1 TABLET BY MOUTH EVERY MORNING WITH breakfast, Disp: 30 tablet, Rfl: 2   folic acid (FOLVITE) 1 MG tablet, TAKE 1 TABLET BY MOUTH EVERY DAY, Disp: 30 tablet, Rfl: 2   gabapentin (NEURONTIN) 600 MG tablet, Take 1 tablet (600 mg total) by mouth 3 (three) times daily. TAKE 2 TABLETS BY MOUTH 3 TIMES DAILY (Patient taking differently: Take 600 mg by mouth 3 (three) times daily.), Disp: 270 tablet, Rfl: 0   GNP VITAMIN B-1 100 MG tablet, TAKE 1 TABLET BY MOUTH EVERY DAY, Disp: 30 tablet, Rfl: 2   HYDROcodone-acetaminophen (NORCO) 10-325 MG tablet, Take 1 tablet by mouth every 8 (eight) hours as needed., Disp: 90 tablet, Rfl: 0   nebivolol (BYSTOLIC) 10 MG tablet, TAKE 1 TABLET BY MOUTH EVERY DAY, Disp: 30 tablet, Rfl: 2   nystatin (MYCOSTATIN/NYSTOP) powder, APPLY 1 APPLICATION TOPICALLY 3 TIMES DAILY, Disp: 60 g, Rfl: 3   Oxcarbazepine (TRILEPTAL) 300 MG tablet, Take 300 mg by mouth 2 (two) times daily., Disp: , Rfl:    pantoprazole (PROTONIX) 40 MG tablet, TAKE 1 TABLET BY MOUTH 2 TIMES DAILY, Disp: 60 tablet, Rfl: 2   phentermine 37.5 MG capsule, Take 1 capsule (37.5 mg total) by mouth every morning., Disp: 90 capsule, Rfl: 0   promethazine (PHENERGAN) 25 MG tablet, TAKE 1 TABLET BY MOUTH EVERY 6 HOURS AS NEEDED FOR NAUSEA AND VOMITING (Patient taking differently: Take 25 mg by mouth every 6 (six) hours as needed for vomiting or nausea.), Disp: 45 tablet, Rfl: 3   pyridOXINE (VITAMIN B6) 100 MG tablet, TAKE 1 TABLET BY MOUTH EVERY DAY, Disp: 30 tablet, Rfl: 2   SKYRIZI 150 MG/ML SOSY, Inject 150 mg into the skin every 3 (three) months. (Patient not taking: Reported on 07/05/2022), Disp: , Rfl:   Allergies  Allergen Reactions   Niacin Anaphylaxis, Shortness Of Breath and Swelling    Swelling and "problems breathing"   Sulfamethoxazole-Trimethoprim Anaphylaxis, Swelling, Rash and Other (See Comments)    Throat closed and the eyes became swollen   Aspirin Hives    Bactrim Hives   Benzoin Compound Rash   Cephalexin Hives    Tolerated ceftriaxone and cefepime 07/2019   Iohexol Hives    Pt treated with PO benedryl   Lisinopril Hives   Naproxen Hives   Pnu-Imune [Pneumococcal Polysaccharide Vaccine] Rash   Sulfonamide Derivatives Rash    Objective:   There were no vitals taken for this visit. AAOx3, NAD, lying in bed NCAT, EOMI No obvious CN deficits Pale  Pt is able to speak clearly, coherently without shortness of breath or increased work of breathing.  Thought process is linear.  Mood is appropriate.   Assessment and Plan:   Bacterial sinusitis- new.  Pt's sxs are consistent w/ infxn and given her compromised immune system, will tx w/ abx.  Reviewed supportive care and red flags that should prompt return.  Pt expressed understanding and  is in agreement w/ plan.    [name], MD 07/05/2022

## 2022-07-07 DIAGNOSIS — E538 Deficiency of other specified B group vitamins: Secondary | ICD-10-CM | POA: Diagnosis not present

## 2022-07-07 DIAGNOSIS — F32A Depression, unspecified: Secondary | ICD-10-CM | POA: Diagnosis not present

## 2022-07-07 DIAGNOSIS — Z79891 Long term (current) use of opiate analgesic: Secondary | ICD-10-CM | POA: Diagnosis not present

## 2022-07-07 DIAGNOSIS — I10 Essential (primary) hypertension: Secondary | ICD-10-CM | POA: Diagnosis not present

## 2022-07-07 DIAGNOSIS — G9529 Other cord compression: Secondary | ICD-10-CM | POA: Diagnosis not present

## 2022-07-07 DIAGNOSIS — G4733 Obstructive sleep apnea (adult) (pediatric): Secondary | ICD-10-CM | POA: Diagnosis not present

## 2022-07-07 DIAGNOSIS — K219 Gastro-esophageal reflux disease without esophagitis: Secondary | ICD-10-CM | POA: Diagnosis not present

## 2022-07-07 DIAGNOSIS — G9589 Other specified diseases of spinal cord: Secondary | ICD-10-CM | POA: Diagnosis not present

## 2022-07-07 DIAGNOSIS — K760 Fatty (change of) liver, not elsewhere classified: Secondary | ICD-10-CM | POA: Diagnosis not present

## 2022-07-07 DIAGNOSIS — L409 Psoriasis, unspecified: Secondary | ICD-10-CM | POA: Diagnosis not present

## 2022-07-07 DIAGNOSIS — I872 Venous insufficiency (chronic) (peripheral): Secondary | ICD-10-CM | POA: Diagnosis not present

## 2022-07-07 DIAGNOSIS — E559 Vitamin D deficiency, unspecified: Secondary | ICD-10-CM | POA: Diagnosis not present

## 2022-07-07 DIAGNOSIS — E871 Hypo-osmolality and hyponatremia: Secondary | ICD-10-CM | POA: Diagnosis not present

## 2022-07-07 DIAGNOSIS — Z993 Dependence on wheelchair: Secondary | ICD-10-CM | POA: Diagnosis not present

## 2022-07-07 DIAGNOSIS — M4802 Spinal stenosis, cervical region: Secondary | ICD-10-CM | POA: Diagnosis not present

## 2022-07-07 DIAGNOSIS — G43909 Migraine, unspecified, not intractable, without status migrainosus: Secondary | ICD-10-CM | POA: Diagnosis not present

## 2022-07-07 DIAGNOSIS — G629 Polyneuropathy, unspecified: Secondary | ICD-10-CM | POA: Diagnosis not present

## 2022-07-07 DIAGNOSIS — E785 Hyperlipidemia, unspecified: Secondary | ICD-10-CM | POA: Diagnosis not present

## 2022-07-07 DIAGNOSIS — M5136 Other intervertebral disc degeneration, lumbar region: Secondary | ICD-10-CM | POA: Diagnosis not present

## 2022-07-07 DIAGNOSIS — M300 Polyarteritis nodosa: Secondary | ICD-10-CM | POA: Diagnosis not present

## 2022-07-07 DIAGNOSIS — Z79899 Other long term (current) drug therapy: Secondary | ICD-10-CM | POA: Diagnosis not present

## 2022-07-07 DIAGNOSIS — M797 Fibromyalgia: Secondary | ICD-10-CM | POA: Diagnosis not present

## 2022-07-08 ENCOUNTER — Telehealth: Payer: Self-pay | Admitting: Family Medicine

## 2022-07-08 NOTE — Telephone Encounter (Signed)
Placed in Dr Birdie Riddle to be signed folder

## 2022-07-08 NOTE — Telephone Encounter (Signed)
Received home health forms from [facility]. Placed in front bin with charge sheet.

## 2022-07-11 DIAGNOSIS — Z79891 Long term (current) use of opiate analgesic: Secondary | ICD-10-CM | POA: Diagnosis not present

## 2022-07-11 DIAGNOSIS — I872 Venous insufficiency (chronic) (peripheral): Secondary | ICD-10-CM | POA: Diagnosis not present

## 2022-07-11 DIAGNOSIS — L409 Psoriasis, unspecified: Secondary | ICD-10-CM | POA: Diagnosis not present

## 2022-07-11 DIAGNOSIS — G4733 Obstructive sleep apnea (adult) (pediatric): Secondary | ICD-10-CM | POA: Diagnosis not present

## 2022-07-11 DIAGNOSIS — G43909 Migraine, unspecified, not intractable, without status migrainosus: Secondary | ICD-10-CM | POA: Diagnosis not present

## 2022-07-11 DIAGNOSIS — I10 Essential (primary) hypertension: Secondary | ICD-10-CM | POA: Diagnosis not present

## 2022-07-11 DIAGNOSIS — G629 Polyneuropathy, unspecified: Secondary | ICD-10-CM | POA: Diagnosis not present

## 2022-07-11 DIAGNOSIS — E538 Deficiency of other specified B group vitamins: Secondary | ICD-10-CM | POA: Diagnosis not present

## 2022-07-11 DIAGNOSIS — E559 Vitamin D deficiency, unspecified: Secondary | ICD-10-CM | POA: Diagnosis not present

## 2022-07-11 DIAGNOSIS — K219 Gastro-esophageal reflux disease without esophagitis: Secondary | ICD-10-CM | POA: Diagnosis not present

## 2022-07-11 DIAGNOSIS — G9589 Other specified diseases of spinal cord: Secondary | ICD-10-CM | POA: Diagnosis not present

## 2022-07-11 DIAGNOSIS — Z993 Dependence on wheelchair: Secondary | ICD-10-CM | POA: Diagnosis not present

## 2022-07-11 DIAGNOSIS — M797 Fibromyalgia: Secondary | ICD-10-CM | POA: Diagnosis not present

## 2022-07-11 DIAGNOSIS — M4802 Spinal stenosis, cervical region: Secondary | ICD-10-CM | POA: Diagnosis not present

## 2022-07-11 DIAGNOSIS — K760 Fatty (change of) liver, not elsewhere classified: Secondary | ICD-10-CM | POA: Diagnosis not present

## 2022-07-11 DIAGNOSIS — E871 Hypo-osmolality and hyponatremia: Secondary | ICD-10-CM | POA: Diagnosis not present

## 2022-07-11 DIAGNOSIS — G9529 Other cord compression: Secondary | ICD-10-CM | POA: Diagnosis not present

## 2022-07-11 DIAGNOSIS — E785 Hyperlipidemia, unspecified: Secondary | ICD-10-CM | POA: Diagnosis not present

## 2022-07-11 DIAGNOSIS — M5136 Other intervertebral disc degeneration, lumbar region: Secondary | ICD-10-CM | POA: Diagnosis not present

## 2022-07-11 DIAGNOSIS — Z79899 Other long term (current) drug therapy: Secondary | ICD-10-CM | POA: Diagnosis not present

## 2022-07-11 DIAGNOSIS — M300 Polyarteritis nodosa: Secondary | ICD-10-CM | POA: Diagnosis not present

## 2022-07-11 DIAGNOSIS — F32A Depression, unspecified: Secondary | ICD-10-CM | POA: Diagnosis not present

## 2022-07-11 NOTE — Unmapped (Signed)
Reason for call:     Pt has an upcoming appt and is requesting a virtual visit. Pt indicated that she is still having much trouble with feet and being able to walk. Thought would be further along with improvement but she is not.     Please advise if can do virtual visit for 2/29 appt    Last ov: Visit date not found  Next ov: 07/14/2022

## 2022-07-11 NOTE — Telephone Encounter (Signed)
Forms faxed and placed in scan  

## 2022-07-11 NOTE — Telephone Encounter (Signed)
Forms signed and returned to [facility]

## 2022-07-11 NOTE — Unmapped (Signed)
Reason for call:     Left VM for pt to call back -- Dr. Scarlette Calico needs pt to be seen in person (its been over a year) and if she is having problems with hands & feet, she will need to evaluate her.     Last ov: Visit date not found  Next ov: 07/14/2022

## 2022-07-14 ENCOUNTER — Telehealth: Admit: 2022-07-14 | Discharge: 2022-07-15 | Payer: MEDICARE

## 2022-07-14 ENCOUNTER — Telehealth: Payer: Self-pay | Admitting: Pulmonary Disease

## 2022-07-14 ENCOUNTER — Other Ambulatory Visit: Payer: Self-pay | Admitting: Family Medicine

## 2022-07-14 DIAGNOSIS — L52 Erythema nodosum: Principal | ICD-10-CM

## 2022-07-14 DIAGNOSIS — M300 Polyarteritis nodosa: Principal | ICD-10-CM

## 2022-07-14 NOTE — Telephone Encounter (Signed)
Patient is requesting a refill of the following medications: Requested Prescriptions   Pending Prescriptions Disp Refills   HYDROcodone-acetaminophen (NORCO) 10-325 MG tablet 90 tablet 0    Sig: Take 1 tablet by mouth every 8 (eight) hours as needed.    Date of patient request: 07/14/22 Last office visit: 07/05/22 Date of last refill: 06/13/22 Last refill amount: 90

## 2022-07-14 NOTE — Telephone Encounter (Signed)
Pt calling again about sleep study. Pls call @ [phone]

## 2022-07-14 NOTE — Telephone Encounter (Signed)
Patient would like the nurse to call regarding a sleep study that she said the doctor was going to schedule.  She stated that she has not heard anything yet.  Please advise and call patient with an update.  CB# [phone]

## 2022-07-15 MED ORDER — HYDROCODONE-ACETAMINOPHEN 10-325 MG PO TABS: 1.0000 | ORAL_TABLET | Freq: Three times a day (TID) | ORAL | 0 refills | Status: DC | PRN

## 2022-07-15 NOTE — Unmapped (Signed)
Patient Name: Autumn Mcdonald  PCP: ??Frederico Hamman, MD  Source of History: patient and records  Date of Visit: 07/14/22 4:08 PM    Identification: Pt self identified using name and date of birth  Patient location: [phone] home phone. Tried calling mobile but received message no service. Sent link 3x. Reached patient on home phone and advised her to log on for video. Prior phone number link sent to at [phone] incorrect. Sent link to [phone].   The limitations of this telemedicine encounter were discussed with patient. Both the patient and myself agreed to this encounter despite these limitations. Benefits of this telemedicine encounter included allowing for continued care of patient and minimizing risk of exposure to COVID-19. Patient also aware that this is a billable encounter with possible copay.     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     PRIOR RHEUMATOLOGIC HISTORY:?? hx of cutaneous PAN of the legs. 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 Additional hx of fibromyalgia for which she is taking duloxetine and gabapentin.   Treatment hx:  - Started prednisone taper and then mtx in 11/2014.  - Plaquenil started in 12/2014.   - Started imuran in 07/2015, and titrated up to 150 mg qd w/o sufficient control of her PAN.   - Switched to cellcept 09/2015, titrated up to 1500 mg BID.   - Persistent leg swelling and pain, thought to be due to swelling from prednisone, so tapered off prednisone  - Re-biopsied 08/2016, results consistent with venous stasis disease vs erythema induratum/nodular vasculitis so started on oral potassium iodide and receiving intralesional steroid injections starting in November/December 2018.  - Due to persistent sx, cellcept discontinued and dapsone started by dermatology. Also received intralesional steroid injections with derm. Dapsone caused transaminitis and worsening LE edema so discontinued.  -Was evaluated by vascular surgery and no vascular insufficiency was noted, thus her leg swelling has been attributed to lymphedema.   - Plaque psoriasis diagnosed by dermatology 2019. Humira started 08/2017.   - HCQ discontinued 09/2017 due to new dx of psoriasis and unclear benefit for PAN.   - Developed TNF induced psoriasis, so humira discontinued and skyrizi started 03/2019  - Current medication regimen:  skyrizi for psoriasis.     HPI: Autumn Mcdonald is a 52 y.o. 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 Last seen by me in person in May 2022 and by [facility] in October 2022. She was last evaluated by Dermatology virtually in March 2023. She was arranged to be seen in person today but requested last minute for a virtual visit against recommendation due to inability to walk. Patient confirmed and is lying down for the video visit.    Today, patient reports that she fell in November 2022 and had no fractures but had vitamin deficiency. She has been bedridden ever since. She is starting to stand. Unable to find hospital discharge. She states she was hospitalized at [facility]. She did have wounds on her legs and sacral decub for a while. On review, see she was hospitalized for five days in January 2023 with weakness. Work-up for the weakness by MRI of the C-spine showed moderate to severe diffuse spinal stenosis C3-4 through C6-7, also C3-4 cord signal abnormalities suspicious for compressive myelomalacia. Neurosurgery was consulted. Per neurosurgery, she will eventually need 3 level ACDF at C3-7, but  not urgent. She also had multiple vitamins deficiencies - low level of vitamin B12, vitamin B6, vitamin D and folate with her history of bariatric surgery in 2014. Vitamins are now replaced.  She has been off Skyrizi for past six months as she had cellulitis in August 2023. She was also hospitalized in December 2023 with pneumonia. She has had no rashes. No psoriasis.  She reports her legs are weak but the swelling and redness has resolved. She is starting to work with physical therapy.    ROS??: Attests to the above, otherwise, all other systems is negative.   ??  ??  Past Medical and Surgical History:  ??  Patient Active Problem List    Diagnosis Date Noted    Melena 02/16/2017    Chronic narcotic use 09/07/2016    Bleeding stomach ulcer 01/20/2016    Acute blood loss anemia 01/14/2016    BRBPR (bright red blood per rectum) 01/14/2016    Neuropathy 12/09/2015    Migraine headache 11/28/2014    PAN (polyarteritis nodosa) (CMS-HCC) 11/28/2014    Chronic gout of multiple sites 02/25/2014    Gastroesophageal reflux disease 01/08/2014    Leg pain, lateral 12/11/2012    S/P skin biopsy 10/31/2012    Psoriasis 01/09/2012    Degeneration of intervertebral disc 11/30/2011    Cutaneous fungal infection 11/15/2011    DJD (degenerative joint disease) 10/14/2011    Frequent urination 04/13/2011    [name] (obstructive sleep apnea) 11/16/2010    Elevated blood sugar 08/23/2010    Other abnormal glucose 10/08/2009    Contracture of tendon 08/17/2009    Leg pain, right 11/05/2008    Other malaise and fatigue 09/19/2008    Ingrowing nail 06/17/2008    Adverse drug reaction 04/03/2008    Morbid obesity (CMS-HCC) 01/03/2007    Hyperlipidemia 09/22/2006    Pain in joint, lower leg 09/22/2006    Muscle pain 09/20/2006    Fibromyalgia 09/20/2006    Anxiety 06/28/2006    Hypertension 06/28/2006     Past Surgical History:   Procedure Laterality Date    CESAREAN SECTION  1994    CHOLECYSTECTOMY  March 1994    GASTRIC BYPASS  January 2014    HYSTERECTOMY  June 1998    SKIN BIOPSY  2017     Allergies:   ??  Allergies   Allergen Reactions    Iohexol Hives     Pt treated with PO benedryl    Keflex [Cephalexin] Shortness Of Breath and Hives    Lincomycin Hcl Hives    Lisinopril Hives     Hives  Hives    Sulfa (Sulfonamide Antibiotics) Swelling and Rash    Sulfamethoxazole-Trimethoprim Hives     REACTION: Rash, throat closed, eyes swelled.  Hives  Hives    Aspirin      REACTION: Hives  High Doses only. Causes rash.  High Doses only. Causes rash.    Naproxen      REACTION: Hives  Eyes well  Eyes well    Niacin      REACTION: Swelling, problems breathing    Benzoin Compound Rash    Pneumococcal 23-Val Ps Vaccine Rash     cellulitis     Current Outpatient Medications:  ??  Current Outpatient Medications on File Prior to Visit   Medication Sig Dispense Refill    ALPRAZolam (XANAX) 0.5 MG tablet Take 0.5 mg by mouth.      ARIPiprazole (ABILIFY) 10 MG tablet Take 10 mg by mouth  daily.      armodafiniL (NUVIGIL) 250 mg tablet Take 250 mg by mouth three (3) times a day.       biotin 10 mg Tab 10 mg per 1 tablet, ORAL, Daily, 0 Refill(s)      buPROPion (WELLBUTRIN XL) 150 MG 24 hr tablet Take 150 mg by mouth.      busPIRone (BUSPAR) 30 MG tablet Take 30 mg by mouth.      calcium citrate (CALCITRATE) 200 mg elem calcium (950 mg) tablet 950 mg per 1 tablet, ORAL, Daily, 60 tablet, 0 Refill(s)      clonazePAM (KLONOPIN) 1 MG tablet TAKE 1 TABLET BY MOUTH EVERY DAY      cyclobenzaprine (FLEXERIL) 10 MG tablet Take 10 mg by mouth once as needed.       diclofenac sodium (VOLTAREN) 1 % gel apply 2g TO affected AREA 4 TIMES DAILY 300 g 5    DULoxetine (CYMBALTA) 60 MG capsule Take 60 mg by mouth daily. 120 mg daily      ferrous sulfate 325 (65 FE) MG tablet Take 1 tablet (325 mg total) by mouth daily with breakfast.  6    furosemide (LASIX) 80 MG tablet 20 mg.      gabapentin (NEURONTIN) 600 MG tablet TAKE 1 TABLET BY MOUTH 3 TIMES DAILY 270 tablet 0    HYDROcodone-acetaminophen (NORCO 10-325) 10-325 mg per tablet Take 1 tablet by mouth.      MEDICAL SUPPLY ITEM Pneumatic pump for Lymphedema  [employer]   Attn: Bartholomew Crews, Energy manager   Fax: [phone] 1 each 0    nebivoloL (BYSTOLIC) 10 MG tablet Take 10 mg by mouth daily.       NYSTOP 100,000 unit/gram powder As needed      omeprazole (PRILOSEC) 20 MG capsule Take 1 capsule by mouth daily.      OXcarbazepine (TRILEPTAL) 300 MG tablet Take 300 mg by mouth.      pantoprazole (PROTONIX) 40 MG tablet Take 40 mg by mouth Two (2) times a day. bid      pentoxifylline (TRENTAL) 400 mg CR tablet Take 1 tablet (400 mg total) by mouth Three (3) times a day with a meal. 270 tablet 3    promethazine (PHENERGAN) 25 MG tablet once as needed.       risankizumab-rzaa (SKYRIZI) 150 mg/mL PnIj Inject the contents of 1 pen (150 mg) under the skin every 12 weeks. 1 mL 3    rosuvastatin (CRESTOR) 20 MG tablet TAKE 1 TABLET BY MOUTH EVERY DAY       No current facility-administered medications on file prior to visit.       Immunization History   Administered Date(s) Administered    COVID-19 VAC,BIVALENT(52YR UP),PFIZER 03/11/2021    COVID-19 VACC,MRNA,(PFIZER)(PF) 08/15/2019, 09/09/2019, 01/31/2020    INFLUENZA TIV (TRI) 80MO+ W/ PRESERV (IM) 03/17/2011    Influenza Vaccine Quad(IM)6 MO-Adult(PF) 02/17/2015, 05/17/2015, 02/23/2016, 02/13/2017, 01/18/2019    Influenza Virus Vaccine, unspecified formulation 02/27/2013, 06/17/2015, 03/14/2016, 01/11/2018, 03/10/2020    Influenza Whole 02/15/2010    PNEUMOCOCCAL POLYSACCHARIDE 23-VALENT 03/17/2011, 01/10/2018    Pneumococcal Conjugate 13-Valent 10/04/2017    TD(TDVAX),ADSORBED,2LF(IM)(PF) 05/16/2002    TdaP 01/05/2016, 07/26/2018     ??  PHYSICAL EXAM? - limited exam with patient on video lying down on a hospital bed  Vital signs: There were no vitals taken for this visit.There is no height or weight on file to calculate BMI.  Gen: Pleasant and cooperative. AOx4.?  HEENT:Face symmetric  Lungs:Talking in full sentences  Neuro: Good comprehension/cognition. CN 2-12 intact.  ??  Limited Musculoskeletal Examination:??  Jaw, neck without limited ROM.??  Shoulders, elbows, wrists, hands, fingers:  No deformity, swelling, or limited ROM.?? No tenderness.  Unable to assess hips  Bilateral legs are swollen with extensive redness and scaling as noted in the past but patient states swelling is less and she has no pain   Skin: Erythema of lower extremity     LABORATORY - no recent labs. Reviewed labs in care everywhere with labs completed in December 2023 at [facility]    ??GENERAL SUMMARY AND IMPRESSION: ??  ??  In summary, the patient is a 51 y.o. female with h/o of possible cutaneous PAN vs erythema induratum previously on pentoxifylline and TNF induced psoriasis managed with Skyrizi by Dermatology. She has not been seen in two years and reports being off pentoxifylline and Skyrizi for at least the past six months without any worsening. She has been bedridden since end of 2022 and through 2023 which has limited her follow-up at [facility] with Rheumatology and Dermatology. She denies any significant joint pain or worsening of lower extremity redness and psoriasis. Given this, will not renew pentoxifylline and Skyrizi. She is participating in PT and starting to bear weight. Advised her to reach out if has worsening joint pain. Otherwise, reassess in 10 months in hopes she will be able to travel for in person evaluation at that time.     RECOMMENDATIONS: ??  ????   Diagnosis ICD-10-CM Associated Orders   1. PAN (polyarteritis nodosa) (CMS-HCC)  M30.0       2. Erythema induratum  L52           There are no Patient Instructions on file for this visit.  The patient indicates understanding of these issues and agrees to the plan as outlined above.  Contact information provided for any concerns or questions in the interim.  ??    The patient reports they are physically located in [place] and is currently: at home. I conducted a audio/video visit. I spent  58m 26s on the video call with the patient. I spent an additional 10 minutes on pre- and post-visit activities on the date of service .     The patient was not located and I was located within 250 yards of a hospital-based location during the real-time audio and video.    Hayes Czaja C. Scarlette Calico, MD, PhD  Assistant Professor of Medicine  Department of Medicine/Division of Rheumatology  [facility]  9:56 PM

## 2022-07-15 NOTE — Telephone Encounter (Signed)
You have sent this Rx in .could you please deny this duplicate I do not have the security to do so . Thank you

## 2022-07-15 NOTE — Telephone Encounter (Signed)
Will close this encounter. Since patient has been scheduled for HST.

## 2022-07-15 NOTE — Telephone Encounter (Signed)
Pt aware Rx has been sent in

## 2022-07-18 ENCOUNTER — Telehealth: Payer: Self-pay

## 2022-07-18 DIAGNOSIS — E559 Vitamin D deficiency, unspecified: Secondary | ICD-10-CM | POA: Diagnosis not present

## 2022-07-18 DIAGNOSIS — G4733 Obstructive sleep apnea (adult) (pediatric): Secondary | ICD-10-CM | POA: Diagnosis not present

## 2022-07-18 DIAGNOSIS — E538 Deficiency of other specified B group vitamins: Secondary | ICD-10-CM | POA: Diagnosis not present

## 2022-07-18 DIAGNOSIS — K760 Fatty (change of) liver, not elsewhere classified: Secondary | ICD-10-CM | POA: Diagnosis not present

## 2022-07-18 DIAGNOSIS — M5136 Other intervertebral disc degeneration, lumbar region: Secondary | ICD-10-CM | POA: Diagnosis not present

## 2022-07-18 DIAGNOSIS — Z993 Dependence on wheelchair: Secondary | ICD-10-CM | POA: Diagnosis not present

## 2022-07-18 DIAGNOSIS — E785 Hyperlipidemia, unspecified: Secondary | ICD-10-CM | POA: Diagnosis not present

## 2022-07-18 DIAGNOSIS — Z79891 Long term (current) use of opiate analgesic: Secondary | ICD-10-CM | POA: Diagnosis not present

## 2022-07-18 DIAGNOSIS — M4802 Spinal stenosis, cervical region: Secondary | ICD-10-CM | POA: Diagnosis not present

## 2022-07-18 DIAGNOSIS — I10 Essential (primary) hypertension: Secondary | ICD-10-CM | POA: Diagnosis not present

## 2022-07-18 DIAGNOSIS — M300 Polyarteritis nodosa: Secondary | ICD-10-CM | POA: Diagnosis not present

## 2022-07-18 DIAGNOSIS — E871 Hypo-osmolality and hyponatremia: Secondary | ICD-10-CM | POA: Diagnosis not present

## 2022-07-18 DIAGNOSIS — G9529 Other cord compression: Secondary | ICD-10-CM | POA: Diagnosis not present

## 2022-07-18 DIAGNOSIS — M797 Fibromyalgia: Secondary | ICD-10-CM | POA: Diagnosis not present

## 2022-07-18 DIAGNOSIS — F32A Depression, unspecified: Secondary | ICD-10-CM | POA: Diagnosis not present

## 2022-07-18 DIAGNOSIS — Z79899 Other long term (current) drug therapy: Secondary | ICD-10-CM | POA: Diagnosis not present

## 2022-07-18 DIAGNOSIS — G629 Polyneuropathy, unspecified: Secondary | ICD-10-CM | POA: Diagnosis not present

## 2022-07-18 DIAGNOSIS — L409 Psoriasis, unspecified: Secondary | ICD-10-CM | POA: Diagnosis not present

## 2022-07-18 DIAGNOSIS — G9589 Other specified diseases of spinal cord: Secondary | ICD-10-CM

## 2022-07-18 DIAGNOSIS — K219 Gastro-esophageal reflux disease without esophagitis: Secondary | ICD-10-CM | POA: Diagnosis not present

## 2022-07-18 DIAGNOSIS — G43909 Migraine, unspecified, not intractable, without status migrainosus: Secondary | ICD-10-CM | POA: Diagnosis not present

## 2022-07-18 DIAGNOSIS — I872 Venous insufficiency (chronic) (peripheral): Secondary | ICD-10-CM | POA: Diagnosis not present

## 2022-07-18 NOTE — Telephone Encounter (Signed)
Patient is needing a rx for a hospital bed, hers will no longer go up and down. I let her know that I would send this to Dr Birdie Riddle and get right back with her.

## 2022-07-18 NOTE — Telephone Encounter (Signed)
Order entered as DME prescription.  Can be faxed to pt's Home Health Provider

## 2022-07-18 NOTE — Telephone Encounter (Signed)
Faxed to [phone] per patient request. Received the fax confirmation

## 2022-07-20 ENCOUNTER — Other Ambulatory Visit: Payer: Self-pay | Admitting: Family Medicine

## 2022-07-20 ENCOUNTER — Telehealth: Payer: Self-pay

## 2022-07-20 ENCOUNTER — Telehealth (INDEPENDENT_AMBULATORY_CARE_PROVIDER_SITE_OTHER): Payer: 59 | Admitting: Family Medicine

## 2022-07-20 ENCOUNTER — Ambulatory Visit: Payer: 59

## 2022-07-20 ENCOUNTER — Encounter: Payer: Self-pay | Admitting: Family Medicine

## 2022-07-20 VITALS — Ht 62.0 in | Wt 242.0 lb

## 2022-07-20 DIAGNOSIS — G9589 Other specified diseases of spinal cord: Secondary | ICD-10-CM | POA: Diagnosis not present

## 2022-07-20 DIAGNOSIS — G4733 Obstructive sleep apnea (adult) (pediatric): Secondary | ICD-10-CM

## 2022-07-20 DIAGNOSIS — R051 Acute cough: Secondary | ICD-10-CM

## 2022-07-20 NOTE — Telephone Encounter (Signed)
Noted placed in scan

## 2022-07-20 NOTE — Telephone Encounter (Signed)
Pt aware Rx has been sent in

## 2022-07-20 NOTE — Telephone Encounter (Signed)
Forms were just an FYI.  Nothing to sign.

## 2022-07-20 NOTE — Telephone Encounter (Signed)
Klonopin 1 mg  LOV: 07/05/22 Last Refill:1/10/2 Upcoming appt: 07/20/22

## 2022-07-20 NOTE — Telephone Encounter (Signed)
[facility] sent forms to be completed  Placed in Dr Birdie Riddle to be signed folder

## 2022-07-20 NOTE — Progress Notes (Signed)
Virtual Visit via Video   I connected with patient on 07/20/22 at  1:20 PM EST by a video enabled telemedicine application and verified that I am speaking with the correct person using two identifiers.  Location patient: Home Location provider: [facility], Office Persons participating in the virtual visit: Patient, Provider, [place] Marcille Blanco C)  I discussed the limitations of evaluation and management by telemedicine and the availability of in person appointments. The patient expressed understanding and agreed to proceed.  Subjective:   HPI:   Cough- pt reports that when she called yesterday she felt 'rotten, like I was coming down w/ something'.  Today is feeling better.  Still has cough but not as bad.  No fever.  Pt has not had a HA since Friday.  No N/V/D.  Pt is taking Zyrtec and Mucinex.  ROS:   See pertinent positives and negatives per HPI.  Patient Active Problem List   Diagnosis Date Noted   Nocturnal hypoxemia 06/21/2022   Elevated LFTs 05/06/2022   Obesity hypoventilation syndrome ([place]) 05/06/2022   Acute respiratory failure with hypoxia (HCC) 04/22/2022   Hypokalemia 04/22/2022   Leg cramps 01/12/2022   CAP (community acquired pneumonia) 08/12/2021   Fever of unknown origin 08/12/2021   Sacral pressure sore 07/06/2021   Vitamin B6 deficiency 06/08/2021   Myelomalacia of cervical cord ([place]) 06/06/2021   Compressive cervical cord myelomalacia (HCC) 06/05/2021   B12 deficiency 06/05/2021   Folate deficiency 06/05/2021   Vitamin D deficiency 06/05/2021   Leg weakness, bilateral 06/04/2021   Cervical atypia 01/06/2021   Steatosis of liver 01/06/2021   Hyponatremia 07/27/2019   Sepsis ([place]) 07/26/2019   Urinary urgency 04/04/2018   Chronic narcotic use 09/07/2016   Acute blood loss anemia 01/14/2016   Multiple gastric ulcers    PAN (polyarteritis nodosa) ([place]) 11/24/2015   GERD (gastroesophageal reflux disease) 01/08/2014   Routine general medical  examination at a health care facility 04/23/2013   Bariatric surgery status 10/31/2012   S/P skin biopsy 10/31/2012   Psoriasis 01/09/2012   DDD (degenerative disc disease), lumbar 11/30/2011   Migraine headache    [name] (obstructive sleep apnea) 11/16/2010   HYPERGLYCEMIA, FASTING 10/08/2009   CONTRACTURE OF TENDON 08/17/2009   PARESTHESIA, HANDS 12/23/2008   Leg pain, right 11/05/2008   ADVERSE DRUG REACTION 04/03/2008   PERIPHERAL EDEMA 03/26/2008   Obesity, morbid, BMI 50 or higher ([place]) 01/03/2007   Cellulitis 01/03/2007   Hyperlipidemia 09/22/2006   Fibromyalgia 09/20/2006   Anxiety and depression 06/28/2006   Essential hypertension 06/28/2006    Social History   Tobacco Use   Smoking status: Never   Smokeless tobacco: Never  Substance Use Topics   Alcohol use: No    Current Outpatient Medications:    albuterol (VENTOLIN HFA) 108 (90 Base) MCG/ACT inhaler, Inhale 2 puffs into the lungs every 6 (six) hours as needed for wheezing or shortness of breath., Disp: 8 g, Rfl: 0   ALPRAZolam (XANAX) 0.5 MG tablet, TAKE 1 TABLET BY MOUTH 2 TIMES DAILY, Disp: 30 tablet, Rfl: 3   ARIPiprazole (ABILIFY) 15 MG tablet, TAKE 1 TABLET BY MOUTH EVERY DAY, Disp: 90 tablet, Rfl: 1   Armodafinil 250 MG tablet, Take 250 mg by mouth in the morning and at bedtime., Disp: , Rfl:    Biotin 10 MG TABS, Take 10 mg by mouth in the morning. , Disp: , Rfl:    buPROPion (WELLBUTRIN XL) 300 MG 24 hr tablet, TAKE 1 TABLET BY MOUTH EVERY DAY,  Disp: 30 tablet, Rfl: 2   calcium citrate (CALCITRATE - DOSED IN MG ELEMENTAL CALCIUM) 950 MG tablet, Take 1 tablet by mouth daily., Disp: , Rfl:    clonazePAM (KLONOPIN) 1 MG tablet, Take 1 mg by mouth at bedtime., Disp: , Rfl:    cyanocobalamin (VITAMIN B12) 1000 MCG tablet, TAKE 1 TABLET BY MOUTH EVERY DAY, Disp: 30 tablet, Rfl: 2   cyclobenzaprine (FLEXERIL) 10 MG tablet, TAKE 2 TABLETS BY MOUTH 2 TIMES DAILY, Disp: 120 tablet, Rfl: 2   diclofenac Sodium (VOLTAREN)  1 % GEL, Apply 4 g topically 4 (four) times daily., Disp: , Rfl:    DULoxetine (CYMBALTA) 60 MG capsule, Take 2 capsules (120 mg total) by mouth daily. (Patient taking differently: Take 60 mg by mouth 2 (two) times daily.), Disp: 240 capsule, Rfl: 2   ferrous sulfate (FEROSUL) 325 (65 FE) MG tablet, TAKE 1 TABLET BY MOUTH EVERY MORNING WITH breakfast, Disp: 30 tablet, Rfl: 2   folic acid (FOLVITE) 1 MG tablet, TAKE 1 TABLET BY MOUTH EVERY DAY, Disp: 30 tablet, Rfl: 2   gabapentin (NEURONTIN) 600 MG tablet, Take 1 tablet (600 mg total) by mouth 3 (three) times daily. TAKE 2 TABLETS BY MOUTH 3 TIMES DAILY (Patient taking differently: Take 600 mg by mouth 3 (three) times daily.), Disp: 270 tablet, Rfl: 0   GNP VITAMIN B-1 100 MG tablet, TAKE 1 TABLET BY MOUTH EVERY DAY, Disp: 30 tablet, Rfl: 2   HYDROcodone-acetaminophen (NORCO) 10-325 MG tablet, Take 1 tablet by mouth every 8 (eight) hours as needed., Disp: 90 tablet, Rfl: 0   nebivolol (BYSTOLIC) 10 MG tablet, TAKE 1 TABLET BY MOUTH EVERY DAY, Disp: 30 tablet, Rfl: 2   nystatin (MYCOSTATIN/NYSTOP) powder, APPLY 1 APPLICATION TOPICALLY 3 TIMES DAILY, Disp: 60 g, Rfl: 3   Oxcarbazepine (TRILEPTAL) 300 MG tablet, Take 300 mg by mouth 2 (two) times daily., Disp: , Rfl:    pantoprazole (PROTONIX) 40 MG tablet, TAKE 1 TABLET BY MOUTH 2 TIMES DAILY, Disp: 60 tablet, Rfl: 2   phentermine 37.5 MG capsule, Take 1 capsule (37.5 mg total) by mouth every morning., Disp: 90 capsule, Rfl: 0   promethazine (PHENERGAN) 25 MG tablet, TAKE 1 TABLET BY MOUTH EVERY 6 HOURS AS NEEDED FOR NAUSEA AND VOMITING (Patient taking differently: Take 25 mg by mouth every 6 (six) hours as needed for vomiting or nausea.), Disp: 45 tablet, Rfl: 3   pyridOXINE (VITAMIN B6) 100 MG tablet, TAKE 1 TABLET BY MOUTH EVERY DAY, Disp: 30 tablet, Rfl: 2   SKYRIZI 150 MG/ML SOSY, Inject 150 mg into the skin every 3 (three) months. (Patient not taking: Reported on 07/05/2022), Disp: , Rfl:    Allergies  Allergen Reactions   Niacin Anaphylaxis, Shortness Of Breath and Swelling    Swelling and "problems breathing"   Sulfamethoxazole-Trimethoprim Anaphylaxis, Swelling, Rash and Other (See Comments)    Throat closed and the eyes became swollen   Aspirin Hives   Bactrim Hives   Benzoin Compound Rash   Cephalexin Hives    Tolerated ceftriaxone and cefepime 07/2019   Iohexol Hives    Pt treated with PO benedryl   Lisinopril Hives   Naproxen Hives   Pnu-Imune [Pneumococcal Polysaccharide Vaccine] Rash   Sulfonamide Derivatives Rash    Objective:   Ht '5\' 2"'$  (1.575 m)   Wt 242 lb (109.8 kg)   BMI 44.26 kg/m  AAOx3, NAD NCAT, EOMI No obvious CN deficits Coloring WNL Pt is able to speak clearly, coherently without  shortness of breath or increased work of breathing.  Thought process is linear.  Mood is appropriate.   Assessment and Plan:   Cough- new.  Pt reports sxs were much more pronounced yesterday and she felt like she was coming down w/ something.  Today she feels much better and isn't worried about being sick at this time.  Did discuss seasonal allergies and the need to make sure she was taking her medication daily.  Pt expressed understanding and is in agreement w/ plan.    [name], MD 07/20/2022

## 2022-07-21 DIAGNOSIS — G4733 Obstructive sleep apnea (adult) (pediatric): Secondary | ICD-10-CM | POA: Diagnosis not present

## 2022-07-21 DIAGNOSIS — M797 Fibromyalgia: Secondary | ICD-10-CM | POA: Diagnosis not present

## 2022-07-21 DIAGNOSIS — G9589 Other specified diseases of spinal cord: Secondary | ICD-10-CM | POA: Diagnosis not present

## 2022-07-21 DIAGNOSIS — G9529 Other cord compression: Secondary | ICD-10-CM | POA: Diagnosis not present

## 2022-07-21 DIAGNOSIS — E785 Hyperlipidemia, unspecified: Secondary | ICD-10-CM | POA: Diagnosis not present

## 2022-07-21 DIAGNOSIS — I872 Venous insufficiency (chronic) (peripheral): Secondary | ICD-10-CM | POA: Diagnosis not present

## 2022-07-21 DIAGNOSIS — E538 Deficiency of other specified B group vitamins: Secondary | ICD-10-CM | POA: Diagnosis not present

## 2022-07-21 DIAGNOSIS — M300 Polyarteritis nodosa: Secondary | ICD-10-CM | POA: Diagnosis not present

## 2022-07-21 DIAGNOSIS — M5136 Other intervertebral disc degeneration, lumbar region: Secondary | ICD-10-CM | POA: Diagnosis not present

## 2022-07-21 DIAGNOSIS — E871 Hypo-osmolality and hyponatremia: Secondary | ICD-10-CM | POA: Diagnosis not present

## 2022-07-21 DIAGNOSIS — K219 Gastro-esophageal reflux disease without esophagitis: Secondary | ICD-10-CM | POA: Diagnosis not present

## 2022-07-21 DIAGNOSIS — G629 Polyneuropathy, unspecified: Secondary | ICD-10-CM | POA: Diagnosis not present

## 2022-07-21 DIAGNOSIS — F32A Depression, unspecified: Secondary | ICD-10-CM | POA: Diagnosis not present

## 2022-07-21 DIAGNOSIS — Z993 Dependence on wheelchair: Secondary | ICD-10-CM | POA: Diagnosis not present

## 2022-07-21 DIAGNOSIS — Z79891 Long term (current) use of opiate analgesic: Secondary | ICD-10-CM | POA: Diagnosis not present

## 2022-07-21 DIAGNOSIS — K760 Fatty (change of) liver, not elsewhere classified: Secondary | ICD-10-CM | POA: Diagnosis not present

## 2022-07-21 DIAGNOSIS — Z79899 Other long term (current) drug therapy: Secondary | ICD-10-CM | POA: Diagnosis not present

## 2022-07-21 DIAGNOSIS — G43909 Migraine, unspecified, not intractable, without status migrainosus: Secondary | ICD-10-CM | POA: Diagnosis not present

## 2022-07-21 DIAGNOSIS — M4802 Spinal stenosis, cervical region: Secondary | ICD-10-CM | POA: Diagnosis not present

## 2022-07-21 DIAGNOSIS — L409 Psoriasis, unspecified: Secondary | ICD-10-CM | POA: Diagnosis not present

## 2022-07-21 DIAGNOSIS — I10 Essential (primary) hypertension: Secondary | ICD-10-CM | POA: Diagnosis not present

## 2022-07-21 DIAGNOSIS — E559 Vitamin D deficiency, unspecified: Secondary | ICD-10-CM | POA: Diagnosis not present

## 2022-07-25 ENCOUNTER — Telehealth: Payer: Self-pay | Admitting: Family Medicine

## 2022-07-25 ENCOUNTER — Telehealth (HOSPITAL_BASED_OUTPATIENT_CLINIC_OR_DEPARTMENT_OTHER): Payer: Self-pay | Admitting: Pulmonary Disease

## 2022-07-25 DIAGNOSIS — E785 Hyperlipidemia, unspecified: Secondary | ICD-10-CM | POA: Diagnosis not present

## 2022-07-25 DIAGNOSIS — Z79891 Long term (current) use of opiate analgesic: Secondary | ICD-10-CM | POA: Diagnosis not present

## 2022-07-25 DIAGNOSIS — Z79899 Other long term (current) drug therapy: Secondary | ICD-10-CM | POA: Diagnosis not present

## 2022-07-25 DIAGNOSIS — G9529 Other cord compression: Secondary | ICD-10-CM | POA: Diagnosis not present

## 2022-07-25 DIAGNOSIS — I10 Essential (primary) hypertension: Secondary | ICD-10-CM | POA: Diagnosis not present

## 2022-07-25 DIAGNOSIS — M5136 Other intervertebral disc degeneration, lumbar region: Secondary | ICD-10-CM | POA: Diagnosis not present

## 2022-07-25 DIAGNOSIS — E559 Vitamin D deficiency, unspecified: Secondary | ICD-10-CM | POA: Diagnosis not present

## 2022-07-25 DIAGNOSIS — G4733 Obstructive sleep apnea (adult) (pediatric): Secondary | ICD-10-CM

## 2022-07-25 DIAGNOSIS — Z993 Dependence on wheelchair: Secondary | ICD-10-CM | POA: Diagnosis not present

## 2022-07-25 DIAGNOSIS — G43909 Migraine, unspecified, not intractable, without status migrainosus: Secondary | ICD-10-CM | POA: Diagnosis not present

## 2022-07-25 DIAGNOSIS — F32A Depression, unspecified: Secondary | ICD-10-CM | POA: Diagnosis not present

## 2022-07-25 DIAGNOSIS — E538 Deficiency of other specified B group vitamins: Secondary | ICD-10-CM | POA: Diagnosis not present

## 2022-07-25 DIAGNOSIS — K760 Fatty (change of) liver, not elsewhere classified: Secondary | ICD-10-CM | POA: Diagnosis not present

## 2022-07-25 DIAGNOSIS — L409 Psoriasis, unspecified: Secondary | ICD-10-CM | POA: Diagnosis not present

## 2022-07-25 DIAGNOSIS — M4802 Spinal stenosis, cervical region: Secondary | ICD-10-CM | POA: Diagnosis not present

## 2022-07-25 DIAGNOSIS — E871 Hypo-osmolality and hyponatremia: Secondary | ICD-10-CM | POA: Diagnosis not present

## 2022-07-25 DIAGNOSIS — M300 Polyarteritis nodosa: Secondary | ICD-10-CM | POA: Diagnosis not present

## 2022-07-25 DIAGNOSIS — M797 Fibromyalgia: Secondary | ICD-10-CM | POA: Diagnosis not present

## 2022-07-25 DIAGNOSIS — I872 Venous insufficiency (chronic) (peripheral): Secondary | ICD-10-CM | POA: Diagnosis not present

## 2022-07-25 DIAGNOSIS — K219 Gastro-esophageal reflux disease without esophagitis: Secondary | ICD-10-CM | POA: Diagnosis not present

## 2022-07-25 DIAGNOSIS — G629 Polyneuropathy, unspecified: Secondary | ICD-10-CM | POA: Diagnosis not present

## 2022-07-25 DIAGNOSIS — G9589 Other specified diseases of spinal cord: Secondary | ICD-10-CM | POA: Diagnosis not present

## 2022-07-25 LAB — COLOGUARD

## 2022-07-25 NOTE — Telephone Encounter (Signed)
[facility] Telephone Encounter  HST 07/25/22 - AHI 11.4 Nadir SpO2 88%  A/P Mild [name] --START auto CPAP 5-15 cm H20 with supplies

## 2022-07-25 NOTE — Telephone Encounter (Signed)
Yes.  If insurance is saying what we have isn't sufficient, we will need a video visit to discuss

## 2022-07-25 NOTE — Telephone Encounter (Signed)
Noted  

## 2022-07-25 NOTE — Telephone Encounter (Signed)
Called and spoke with pt letting her know the results of recent HST and she verbalized understanding. Order for cpap has been placed. Pt already has a f/u with Dr. Loanne Drilling in May. Nothing further needed.

## 2022-07-25 NOTE — Telephone Encounter (Signed)
I will be sure to document this during her upcoming visit

## 2022-07-25 NOTE — Telephone Encounter (Signed)
Caller name: KALYAH SAGONA  On DPR?: Yes  Call back number: [phone] (mobile)  Provider they see: [name], MD  Reason for call: DME call about hospital bed.

## 2022-07-25 NOTE — Telephone Encounter (Signed)
Spoke to Zarephath from [employer] and she needs clinical notes stating why the pt needs the  [place] bar ? Would you like me to make pt a video visit to discuss for document ion ?

## 2022-07-25 NOTE — Telephone Encounter (Signed)
Pt called and states her current hospital bed is broke . Insurance will not pay for another per pt unless we give an Rx stating it has to be a HD Ba tric bed due to weight and bed bound

## 2022-07-25 NOTE — Telephone Encounter (Signed)
Initial Comment Caller states she is calling from [facility] needing clinical notes for a pt. of Dr. Birdie Riddle. Patient Name [name] Patient DOB 1970-08-29 Requesting Provider Zoe from [facility] Physician Number [phone] Facility Name [place] Number n/a Disp. Time Disposition Final User 07/22/2022 5:22:59 PM Send to [place], [place] 07/22/2022 5:27:43 PM Called On-Call Provider Lennie Muckle 07/22/2022 5:33:50 PM Page Completed Yes Lennie Muckle Paging DoctorName Phone DateTime Result/Outcome Message Type Notes Shanon Ace - MD [phone] 07/22/2022 5:27:43 PM Called On Call Provider - Reached Doctor Paged Shanon Ace - MD 07/22/2022 5:33:27 PM Spoke with On Call - General Message Result Connected with caller. Call Closed By: Lennie Muckle Transaction Date/Time: 07/22/2022 5:20:02 PM (ET)

## 2022-07-25 NOTE — Telephone Encounter (Signed)
We have scheduled a visit for 07/27/22

## 2022-07-26 ENCOUNTER — Telehealth: Payer: Self-pay | Admitting: Family Medicine

## 2022-07-26 NOTE — Telephone Encounter (Signed)
Received forms from [facility]. Placed forms in front bin with charge sheet.

## 2022-07-27 ENCOUNTER — Telehealth (INDEPENDENT_AMBULATORY_CARE_PROVIDER_SITE_OTHER): Payer: 59 | Admitting: Family Medicine

## 2022-07-27 ENCOUNTER — Encounter: Payer: Self-pay | Admitting: Family Medicine

## 2022-07-27 DIAGNOSIS — Z7401 Bed confinement status: Secondary | ICD-10-CM | POA: Diagnosis not present

## 2022-07-27 NOTE — Progress Notes (Signed)
Virtual Visit via Video   I connected with patient on 07/27/22 at 10:20 AM EDT by a video enabled telemedicine application and verified that I am speaking with the correct person using two identifiers.  Location patient: Home Location provider: [facility], Office Persons participating in the virtual visit: Patient, Provider, [place] Marcille Blanco C)  I discussed the limitations of evaluation and management by telemedicine and the availability of in person appointments. The patient expressed understanding and agreed to proceed.  Subjective:   HPI:   Obesity- pt reports current hospital bed is broken and unable to go up or down.  She is currently bed bound but could sit or even stand if she had a trapeze bar to assist her.  Needs fully electric bed due to her size.  Needs gel overlay to avoid pressure sores.  Pt currently spends all time in bed and needs the ability to reposition.    ROS:   See pertinent positives and negatives per HPI.  Patient Active Problem List   Diagnosis Date Noted   Nocturnal hypoxemia 06/21/2022   Elevated LFTs 05/06/2022   Obesity hypoventilation syndrome ([place]) 05/06/2022   Acute respiratory failure with hypoxia (HCC) 04/22/2022   Hypokalemia 04/22/2022   Leg cramps 01/12/2022   CAP (community acquired pneumonia) 08/12/2021   Fever of unknown origin 08/12/2021   Sacral pressure sore 07/06/2021   Vitamin B6 deficiency 06/08/2021   Myelomalacia of cervical cord ([place]) 06/06/2021   Compressive cervical cord myelomalacia (HCC) 06/05/2021   B12 deficiency 06/05/2021   Folate deficiency 06/05/2021   Vitamin D deficiency 06/05/2021   Leg weakness, bilateral 06/04/2021   Cervical atypia 01/06/2021   Steatosis of liver 01/06/2021   Hyponatremia 07/27/2019   Sepsis ([place]) 07/26/2019   Urinary urgency 04/04/2018   Chronic narcotic use 09/07/2016   Acute blood loss anemia 01/14/2016   Multiple gastric ulcers    PAN (polyarteritis nodosa) ([place]) 11/24/2015    GERD (gastroesophageal reflux disease) 01/08/2014   Routine general medical examination at a health care facility 04/23/2013   Bariatric surgery status 10/31/2012   S/P skin biopsy 10/31/2012   Psoriasis 01/09/2012   DDD (degenerative disc disease), lumbar 11/30/2011   Migraine headache    [name] (obstructive sleep apnea) 11/16/2010   HYPERGLYCEMIA, FASTING 10/08/2009   CONTRACTURE OF TENDON 08/17/2009   PARESTHESIA, HANDS 12/23/2008   Leg pain, right 11/05/2008   ADVERSE DRUG REACTION 04/03/2008   PERIPHERAL EDEMA 03/26/2008   Obesity, morbid, BMI 50 or higher ([place]) 01/03/2007   Cellulitis 01/03/2007   Hyperlipidemia 09/22/2006   Fibromyalgia 09/20/2006   Anxiety and depression 06/28/2006   Essential hypertension 06/28/2006    Social History   Tobacco Use   Smoking status: Never   Smokeless tobacco: Never  Substance Use Topics   Alcohol use: No    Current Outpatient Medications:    albuterol (VENTOLIN HFA) 108 (90 Base) MCG/ACT inhaler, Inhale 2 puffs into the lungs every 6 (six) hours as needed for wheezing or shortness of breath., Disp: 8 g, Rfl: 0   ALPRAZolam (XANAX) 0.5 MG tablet, TAKE 1 TABLET BY MOUTH 2 TIMES DAILY, Disp: 30 tablet, Rfl: 3   ARIPiprazole (ABILIFY) 15 MG tablet, TAKE 1 TABLET BY MOUTH EVERY DAY, Disp: 90 tablet, Rfl: 1   Armodafinil 250 MG tablet, Take 250 mg by mouth in the morning and at bedtime., Disp: , Rfl:    Biotin 10 MG TABS, Take 10 mg by mouth in the morning. , Disp: , Rfl:  buPROPion (WELLBUTRIN XL) 300 MG 24 hr tablet, TAKE 1 TABLET BY MOUTH EVERY DAY, Disp: 30 tablet, Rfl: 2   calcium citrate (CALCITRATE - DOSED IN MG ELEMENTAL CALCIUM) 950 MG tablet, Take 1 tablet by mouth daily., Disp: , Rfl:    clonazePAM (KLONOPIN) 1 MG tablet, TAKE 1 TABLET BY MOUTH AT BEDTIME, Disp: 30 tablet, Rfl: 3   cyanocobalamin (VITAMIN B12) 1000 MCG tablet, TAKE 1 TABLET BY MOUTH EVERY DAY, Disp: 30 tablet, Rfl: 2   cyclobenzaprine (FLEXERIL) 10 MG tablet,  TAKE 2 TABLETS BY MOUTH 2 TIMES DAILY, Disp: 120 tablet, Rfl: 2   diclofenac Sodium (VOLTAREN) 1 % GEL, Apply 4 g topically 4 (four) times daily., Disp: , Rfl:    DULoxetine (CYMBALTA) 60 MG capsule, Take 2 capsules (120 mg total) by mouth daily. (Patient taking differently: Take 60 mg by mouth 2 (two) times daily.), Disp: 240 capsule, Rfl: 2   ferrous sulfate (FEROSUL) 325 (65 FE) MG tablet, TAKE 1 TABLET BY MOUTH EVERY MORNING WITH breakfast, Disp: 30 tablet, Rfl: 2   folic acid (FOLVITE) 1 MG tablet, TAKE 1 TABLET BY MOUTH EVERY DAY, Disp: 30 tablet, Rfl: 2   gabapentin (NEURONTIN) 600 MG tablet, Take 1 tablet (600 mg total) by mouth 3 (three) times daily. TAKE 2 TABLETS BY MOUTH 3 TIMES DAILY (Patient taking differently: Take 600 mg by mouth 3 (three) times daily.), Disp: 270 tablet, Rfl: 0   GNP VITAMIN B-1 100 MG tablet, TAKE 1 TABLET BY MOUTH EVERY DAY, Disp: 30 tablet, Rfl: 2   HYDROcodone-acetaminophen (NORCO) 10-325 MG tablet, Take 1 tablet by mouth every 8 (eight) hours as needed., Disp: 90 tablet, Rfl: 0   nebivolol (BYSTOLIC) 10 MG tablet, TAKE 1 TABLET BY MOUTH EVERY DAY, Disp: 30 tablet, Rfl: 2   nystatin (MYCOSTATIN/NYSTOP) powder, APPLY 1 APPLICATION TOPICALLY 3 TIMES DAILY, Disp: 60 g, Rfl: 3   Oxcarbazepine (TRILEPTAL) 300 MG tablet, Take 300 mg by mouth 2 (two) times daily., Disp: , Rfl:    pantoprazole (PROTONIX) 40 MG tablet, TAKE 1 TABLET BY MOUTH 2 TIMES DAILY, Disp: 60 tablet, Rfl: 2   phentermine 37.5 MG capsule, Take 1 capsule (37.5 mg total) by mouth every morning., Disp: 90 capsule, Rfl: 0   promethazine (PHENERGAN) 25 MG tablet, TAKE 1 TABLET BY MOUTH EVERY 6 HOURS AS NEEDED FOR NAUSEA AND VOMITING (Patient taking differently: Take 25 mg by mouth every 6 (six) hours as needed for vomiting or nausea.), Disp: 45 tablet, Rfl: 3   pyridOXINE (VITAMIN B6) 100 MG tablet, TAKE 1 TABLET BY MOUTH EVERY DAY, Disp: 30 tablet, Rfl: 2  Allergies  Allergen Reactions   Niacin  Anaphylaxis, Shortness Of Breath and Swelling    Swelling and "problems breathing"   Sulfamethoxazole-Trimethoprim Anaphylaxis, Swelling, Rash and Other (See Comments)    Throat closed and the eyes became swollen   Aspirin Hives   Bactrim Hives   Benzoin Compound Rash   Cephalexin Hives    Tolerated ceftriaxone and cefepime 07/2019   Iohexol Hives    Pt treated with PO benedryl   Lisinopril Hives   Naproxen Hives   Pnu-Imune [Pneumococcal Polysaccharide Vaccine] Rash   Sulfonamide Derivatives Rash    Objective:   Ht '5\' 2"'$  (1.575 m)   Wt 242 lb (109.8 kg)   BMI 44.26 kg/m  AAOx3, NAD, obese NCAT, EOMI No obvious CN deficits Coloring WNL Pt is able to speak clearly, coherently without shortness of breath or increased work of breathing.  Thought process is linear.  Mood is appropriate.   Assessment and Plan:   Morbid obesity/bed bound- pt has been bed bound since January 2023 due to cervical cord myelomalacia.  Her recovery has been complicated by her BMI/body habitus and has left her bed bound.  She is regaining strength in her arms and feels she would be able to pull herself up to sit and possibly even stand if she had a trapeze bar to assist her.  She needs a fully electric bed to allow her to change positions frequently and without assistance and needs a gel overlay to avoid pressure sores.  Will send orders and office notes to [facility] to get needed supplies.   [name], MD 07/27/2022

## 2022-07-27 NOTE — Telephone Encounter (Signed)
Placed forms in DR [name] to be signed folder

## 2022-07-28 DIAGNOSIS — L409 Psoriasis, unspecified: Secondary | ICD-10-CM | POA: Diagnosis not present

## 2022-07-28 DIAGNOSIS — M300 Polyarteritis nodosa: Secondary | ICD-10-CM | POA: Diagnosis not present

## 2022-07-28 DIAGNOSIS — E785 Hyperlipidemia, unspecified: Secondary | ICD-10-CM | POA: Diagnosis not present

## 2022-07-28 DIAGNOSIS — I872 Venous insufficiency (chronic) (peripheral): Secondary | ICD-10-CM | POA: Diagnosis not present

## 2022-07-28 DIAGNOSIS — G4733 Obstructive sleep apnea (adult) (pediatric): Secondary | ICD-10-CM | POA: Diagnosis not present

## 2022-07-28 DIAGNOSIS — Z79891 Long term (current) use of opiate analgesic: Secondary | ICD-10-CM | POA: Diagnosis not present

## 2022-07-28 DIAGNOSIS — E538 Deficiency of other specified B group vitamins: Secondary | ICD-10-CM | POA: Diagnosis not present

## 2022-07-28 DIAGNOSIS — F32A Depression, unspecified: Secondary | ICD-10-CM | POA: Diagnosis not present

## 2022-07-28 DIAGNOSIS — M5136 Other intervertebral disc degeneration, lumbar region: Secondary | ICD-10-CM | POA: Diagnosis not present

## 2022-07-28 DIAGNOSIS — Z993 Dependence on wheelchair: Secondary | ICD-10-CM | POA: Diagnosis not present

## 2022-07-28 DIAGNOSIS — M797 Fibromyalgia: Secondary | ICD-10-CM | POA: Diagnosis not present

## 2022-07-28 DIAGNOSIS — G9589 Other specified diseases of spinal cord: Secondary | ICD-10-CM | POA: Diagnosis not present

## 2022-07-28 DIAGNOSIS — M4802 Spinal stenosis, cervical region: Secondary | ICD-10-CM | POA: Diagnosis not present

## 2022-07-28 DIAGNOSIS — K760 Fatty (change of) liver, not elsewhere classified: Secondary | ICD-10-CM | POA: Diagnosis not present

## 2022-07-28 DIAGNOSIS — G9529 Other cord compression: Secondary | ICD-10-CM | POA: Diagnosis not present

## 2022-07-28 DIAGNOSIS — Z79899 Other long term (current) drug therapy: Secondary | ICD-10-CM | POA: Diagnosis not present

## 2022-07-28 DIAGNOSIS — E871 Hypo-osmolality and hyponatremia: Secondary | ICD-10-CM | POA: Diagnosis not present

## 2022-07-28 DIAGNOSIS — K219 Gastro-esophageal reflux disease without esophagitis: Secondary | ICD-10-CM | POA: Diagnosis not present

## 2022-07-28 DIAGNOSIS — I10 Essential (primary) hypertension: Secondary | ICD-10-CM | POA: Diagnosis not present

## 2022-07-28 DIAGNOSIS — G43909 Migraine, unspecified, not intractable, without status migrainosus: Secondary | ICD-10-CM | POA: Diagnosis not present

## 2022-07-28 DIAGNOSIS — E559 Vitamin D deficiency, unspecified: Secondary | ICD-10-CM | POA: Diagnosis not present

## 2022-07-28 DIAGNOSIS — G629 Polyneuropathy, unspecified: Secondary | ICD-10-CM | POA: Diagnosis not present

## 2022-07-29 ENCOUNTER — Other Ambulatory Visit: Payer: Self-pay | Admitting: Family Medicine

## 2022-07-29 ENCOUNTER — Telehealth: Payer: Self-pay

## 2022-07-29 NOTE — Telephone Encounter (Signed)
Faxed order and notes for hospital bed to [phone] that was provided by pt .

## 2022-07-29 NOTE — Telephone Encounter (Signed)
Abilify 15 mg LOV: 07/27/22 Last Refill:02/17/22 Upcoming appt: none

## 2022-07-29 NOTE — Telephone Encounter (Signed)
Pt aware Rx has been sent in.  

## 2022-08-01 ENCOUNTER — Encounter: Payer: Self-pay | Admitting: Family Medicine

## 2022-08-01 ENCOUNTER — Telehealth: Payer: Self-pay | Admitting: Pulmonary Disease

## 2022-08-01 DIAGNOSIS — K219 Gastro-esophageal reflux disease without esophagitis: Secondary | ICD-10-CM | POA: Diagnosis not present

## 2022-08-01 DIAGNOSIS — E871 Hypo-osmolality and hyponatremia: Secondary | ICD-10-CM | POA: Diagnosis not present

## 2022-08-01 DIAGNOSIS — G9529 Other cord compression: Secondary | ICD-10-CM | POA: Diagnosis not present

## 2022-08-01 DIAGNOSIS — K760 Fatty (change of) liver, not elsewhere classified: Secondary | ICD-10-CM | POA: Diagnosis not present

## 2022-08-01 DIAGNOSIS — Z993 Dependence on wheelchair: Secondary | ICD-10-CM | POA: Diagnosis not present

## 2022-08-01 DIAGNOSIS — Z79891 Long term (current) use of opiate analgesic: Secondary | ICD-10-CM | POA: Diagnosis not present

## 2022-08-01 DIAGNOSIS — M797 Fibromyalgia: Secondary | ICD-10-CM | POA: Diagnosis not present

## 2022-08-01 DIAGNOSIS — Z79899 Other long term (current) drug therapy: Secondary | ICD-10-CM | POA: Diagnosis not present

## 2022-08-01 DIAGNOSIS — M4802 Spinal stenosis, cervical region: Secondary | ICD-10-CM | POA: Diagnosis not present

## 2022-08-01 DIAGNOSIS — L409 Psoriasis, unspecified: Secondary | ICD-10-CM | POA: Diagnosis not present

## 2022-08-01 DIAGNOSIS — G43909 Migraine, unspecified, not intractable, without status migrainosus: Secondary | ICD-10-CM | POA: Diagnosis not present

## 2022-08-01 DIAGNOSIS — G4733 Obstructive sleep apnea (adult) (pediatric): Secondary | ICD-10-CM | POA: Diagnosis not present

## 2022-08-01 DIAGNOSIS — I10 Essential (primary) hypertension: Secondary | ICD-10-CM | POA: Diagnosis not present

## 2022-08-01 DIAGNOSIS — E559 Vitamin D deficiency, unspecified: Secondary | ICD-10-CM | POA: Diagnosis not present

## 2022-08-01 DIAGNOSIS — F32A Depression, unspecified: Secondary | ICD-10-CM | POA: Diagnosis not present

## 2022-08-01 DIAGNOSIS — G629 Polyneuropathy, unspecified: Secondary | ICD-10-CM | POA: Diagnosis not present

## 2022-08-01 DIAGNOSIS — E538 Deficiency of other specified B group vitamins: Secondary | ICD-10-CM | POA: Diagnosis not present

## 2022-08-01 DIAGNOSIS — G9589 Other specified diseases of spinal cord: Secondary | ICD-10-CM | POA: Diagnosis not present

## 2022-08-01 DIAGNOSIS — M5136 Other intervertebral disc degeneration, lumbar region: Secondary | ICD-10-CM | POA: Diagnosis not present

## 2022-08-01 DIAGNOSIS — E785 Hyperlipidemia, unspecified: Secondary | ICD-10-CM | POA: Diagnosis not present

## 2022-08-01 DIAGNOSIS — M300 Polyarteritis nodosa: Secondary | ICD-10-CM | POA: Diagnosis not present

## 2022-08-01 DIAGNOSIS — I872 Venous insufficiency (chronic) (peripheral): Secondary | ICD-10-CM | POA: Diagnosis not present

## 2022-08-01 NOTE — Telephone Encounter (Signed)
Form signed and returned to [place] 

## 2022-08-01 NOTE — Telephone Encounter (Signed)
PT calling. States No Cpap order in place w/HSE provider . She called and they have no order. Pls call @ [phone]

## 2022-08-01 NOTE — Telephone Encounter (Signed)
Forms faxed and placed in scan  

## 2022-08-02 NOTE — Telephone Encounter (Signed)
Pt states she spoke with [facility] who won't send claim in because their policy states insurance will only cover cpap machines every 17yrs. States her insurance will cover claim because her machine is broken

## 2022-08-03 DIAGNOSIS — K760 Fatty (change of) liver, not elsewhere classified: Secondary | ICD-10-CM | POA: Diagnosis not present

## 2022-08-03 DIAGNOSIS — Z79891 Long term (current) use of opiate analgesic: Secondary | ICD-10-CM | POA: Diagnosis not present

## 2022-08-03 DIAGNOSIS — G9529 Other cord compression: Secondary | ICD-10-CM | POA: Diagnosis not present

## 2022-08-03 DIAGNOSIS — K219 Gastro-esophageal reflux disease without esophagitis: Secondary | ICD-10-CM | POA: Diagnosis not present

## 2022-08-03 DIAGNOSIS — G4733 Obstructive sleep apnea (adult) (pediatric): Secondary | ICD-10-CM | POA: Diagnosis not present

## 2022-08-03 DIAGNOSIS — M797 Fibromyalgia: Secondary | ICD-10-CM | POA: Diagnosis not present

## 2022-08-03 DIAGNOSIS — E871 Hypo-osmolality and hyponatremia: Secondary | ICD-10-CM | POA: Diagnosis not present

## 2022-08-03 DIAGNOSIS — I10 Essential (primary) hypertension: Secondary | ICD-10-CM | POA: Diagnosis not present

## 2022-08-03 DIAGNOSIS — E559 Vitamin D deficiency, unspecified: Secondary | ICD-10-CM | POA: Diagnosis not present

## 2022-08-03 DIAGNOSIS — M4802 Spinal stenosis, cervical region: Secondary | ICD-10-CM | POA: Diagnosis not present

## 2022-08-03 DIAGNOSIS — Z79899 Other long term (current) drug therapy: Secondary | ICD-10-CM | POA: Diagnosis not present

## 2022-08-03 DIAGNOSIS — E538 Deficiency of other specified B group vitamins: Secondary | ICD-10-CM | POA: Diagnosis not present

## 2022-08-03 DIAGNOSIS — G9589 Other specified diseases of spinal cord: Secondary | ICD-10-CM | POA: Diagnosis not present

## 2022-08-03 DIAGNOSIS — G629 Polyneuropathy, unspecified: Secondary | ICD-10-CM | POA: Diagnosis not present

## 2022-08-03 DIAGNOSIS — I872 Venous insufficiency (chronic) (peripheral): Secondary | ICD-10-CM | POA: Diagnosis not present

## 2022-08-03 DIAGNOSIS — Z993 Dependence on wheelchair: Secondary | ICD-10-CM | POA: Diagnosis not present

## 2022-08-03 DIAGNOSIS — L409 Psoriasis, unspecified: Secondary | ICD-10-CM | POA: Diagnosis not present

## 2022-08-03 DIAGNOSIS — M300 Polyarteritis nodosa: Secondary | ICD-10-CM | POA: Diagnosis not present

## 2022-08-03 DIAGNOSIS — E785 Hyperlipidemia, unspecified: Secondary | ICD-10-CM | POA: Diagnosis not present

## 2022-08-03 DIAGNOSIS — G43909 Migraine, unspecified, not intractable, without status migrainosus: Secondary | ICD-10-CM | POA: Diagnosis not present

## 2022-08-03 DIAGNOSIS — M5136 Other intervertebral disc degeneration, lumbar region: Secondary | ICD-10-CM | POA: Diagnosis not present

## 2022-08-03 DIAGNOSIS — F32A Depression, unspecified: Secondary | ICD-10-CM | POA: Diagnosis not present

## 2022-08-04 NOTE — Telephone Encounter (Signed)
FYI  RE: Replacement CPAP machine Received: 2 days ago Miquel Dunn sent to Darliss Ridgel, [place]; New, Remus Blake, [facility],  I do not see this note in our system, however I do see one where we were needing a new sleep study completed and denied for that reason.  Patient has a Medicare replacement plan and follows Medicare guidelines so this could be the case, looks like patient did receive her pap unit on 04/04/2019. We just received a new order with a required documents so we will need to allow time for processing. If it denies I do believe the patient can file a claim with the insurance for appeal. I will try to keep you posted if I hear anything.  Thank you,  Demetrius Charity

## 2022-08-05 ENCOUNTER — Telehealth: Payer: Self-pay

## 2022-08-05 DIAGNOSIS — Z79899 Other long term (current) drug therapy: Secondary | ICD-10-CM | POA: Diagnosis not present

## 2022-08-05 DIAGNOSIS — Z79891 Long term (current) use of opiate analgesic: Secondary | ICD-10-CM | POA: Diagnosis not present

## 2022-08-05 DIAGNOSIS — G43909 Migraine, unspecified, not intractable, without status migrainosus: Secondary | ICD-10-CM | POA: Diagnosis not present

## 2022-08-05 DIAGNOSIS — E559 Vitamin D deficiency, unspecified: Secondary | ICD-10-CM | POA: Diagnosis not present

## 2022-08-05 DIAGNOSIS — M300 Polyarteritis nodosa: Secondary | ICD-10-CM | POA: Diagnosis not present

## 2022-08-05 DIAGNOSIS — F32A Depression, unspecified: Secondary | ICD-10-CM | POA: Diagnosis not present

## 2022-08-05 DIAGNOSIS — E785 Hyperlipidemia, unspecified: Secondary | ICD-10-CM | POA: Diagnosis not present

## 2022-08-05 DIAGNOSIS — K219 Gastro-esophageal reflux disease without esophagitis: Secondary | ICD-10-CM | POA: Diagnosis not present

## 2022-08-05 DIAGNOSIS — I10 Essential (primary) hypertension: Secondary | ICD-10-CM | POA: Diagnosis not present

## 2022-08-05 DIAGNOSIS — M5136 Other intervertebral disc degeneration, lumbar region: Secondary | ICD-10-CM | POA: Diagnosis not present

## 2022-08-05 DIAGNOSIS — G9529 Other cord compression: Secondary | ICD-10-CM | POA: Diagnosis not present

## 2022-08-05 DIAGNOSIS — G4733 Obstructive sleep apnea (adult) (pediatric): Secondary | ICD-10-CM | POA: Diagnosis not present

## 2022-08-05 DIAGNOSIS — E871 Hypo-osmolality and hyponatremia: Secondary | ICD-10-CM | POA: Diagnosis not present

## 2022-08-05 DIAGNOSIS — E538 Deficiency of other specified B group vitamins: Secondary | ICD-10-CM | POA: Diagnosis not present

## 2022-08-05 DIAGNOSIS — L409 Psoriasis, unspecified: Secondary | ICD-10-CM | POA: Diagnosis not present

## 2022-08-05 DIAGNOSIS — M797 Fibromyalgia: Secondary | ICD-10-CM | POA: Diagnosis not present

## 2022-08-05 DIAGNOSIS — G9589 Other specified diseases of spinal cord: Secondary | ICD-10-CM | POA: Diagnosis not present

## 2022-08-05 DIAGNOSIS — M4802 Spinal stenosis, cervical region: Secondary | ICD-10-CM | POA: Diagnosis not present

## 2022-08-05 DIAGNOSIS — I872 Venous insufficiency (chronic) (peripheral): Secondary | ICD-10-CM | POA: Diagnosis not present

## 2022-08-05 DIAGNOSIS — K760 Fatty (change of) liver, not elsewhere classified: Secondary | ICD-10-CM | POA: Diagnosis not present

## 2022-08-05 DIAGNOSIS — G629 Polyneuropathy, unspecified: Secondary | ICD-10-CM | POA: Diagnosis not present

## 2022-08-05 DIAGNOSIS — Z993 Dependence on wheelchair: Secondary | ICD-10-CM | POA: Diagnosis not present

## 2022-08-05 NOTE — Telephone Encounter (Signed)
Pt called and ask if I would reach out to [place] [employer] in regard's to why they are not paying for  a new hospital bed . We faxed the order on the 07/29/22 to [phone] that was provided by the pt . I called [employer] they stated they have not received a claim for a new hosp bed for pt .  I called Jasmine at [facility] and she transferred me to an intake Systems analyst at [employer] . [facility] states that they need an approval letter from the pt insurance and the pt will need to contact the insurance company . March 18,2024 pt called back to [employer] and they still have not received that letter for the bed . They states pt needs a new sleep study obtained as well . They have filed a claim for the CPAP machine . They suggest she needs to call the insurance in regards to an approval letter for bed . I have notified the pt of the findings

## 2022-08-08 ENCOUNTER — Telehealth: Payer: Self-pay | Admitting: Family Medicine

## 2022-08-08 DIAGNOSIS — G4733 Obstructive sleep apnea (adult) (pediatric): Secondary | ICD-10-CM | POA: Diagnosis not present

## 2022-08-08 DIAGNOSIS — F32A Depression, unspecified: Secondary | ICD-10-CM | POA: Diagnosis not present

## 2022-08-08 DIAGNOSIS — K760 Fatty (change of) liver, not elsewhere classified: Secondary | ICD-10-CM | POA: Diagnosis not present

## 2022-08-08 DIAGNOSIS — G629 Polyneuropathy, unspecified: Secondary | ICD-10-CM | POA: Diagnosis not present

## 2022-08-08 DIAGNOSIS — Z993 Dependence on wheelchair: Secondary | ICD-10-CM | POA: Diagnosis not present

## 2022-08-08 DIAGNOSIS — Z87440 Personal history of urinary (tract) infections: Secondary | ICD-10-CM | POA: Diagnosis not present

## 2022-08-08 DIAGNOSIS — E785 Hyperlipidemia, unspecified: Secondary | ICD-10-CM | POA: Diagnosis not present

## 2022-08-08 DIAGNOSIS — G9529 Other cord compression: Secondary | ICD-10-CM | POA: Diagnosis not present

## 2022-08-08 DIAGNOSIS — E559 Vitamin D deficiency, unspecified: Secondary | ICD-10-CM | POA: Diagnosis not present

## 2022-08-08 DIAGNOSIS — G43909 Migraine, unspecified, not intractable, without status migrainosus: Secondary | ICD-10-CM | POA: Diagnosis not present

## 2022-08-08 DIAGNOSIS — K219 Gastro-esophageal reflux disease without esophagitis: Secondary | ICD-10-CM | POA: Diagnosis not present

## 2022-08-08 DIAGNOSIS — E538 Deficiency of other specified B group vitamins: Secondary | ICD-10-CM | POA: Diagnosis not present

## 2022-08-08 DIAGNOSIS — M300 Polyarteritis nodosa: Secondary | ICD-10-CM | POA: Diagnosis not present

## 2022-08-08 DIAGNOSIS — M5136 Other intervertebral disc degeneration, lumbar region: Secondary | ICD-10-CM | POA: Diagnosis not present

## 2022-08-08 DIAGNOSIS — L409 Psoriasis, unspecified: Secondary | ICD-10-CM | POA: Diagnosis not present

## 2022-08-08 DIAGNOSIS — I872 Venous insufficiency (chronic) (peripheral): Secondary | ICD-10-CM | POA: Diagnosis not present

## 2022-08-08 DIAGNOSIS — M797 Fibromyalgia: Secondary | ICD-10-CM | POA: Diagnosis not present

## 2022-08-08 DIAGNOSIS — Z9181 History of falling: Secondary | ICD-10-CM | POA: Diagnosis not present

## 2022-08-08 DIAGNOSIS — I10 Essential (primary) hypertension: Secondary | ICD-10-CM | POA: Diagnosis not present

## 2022-08-08 DIAGNOSIS — M4802 Spinal stenosis, cervical region: Secondary | ICD-10-CM | POA: Diagnosis not present

## 2022-08-08 NOTE — Telephone Encounter (Signed)
Can you give the PT order for BID week for 8 wks for pt in Dr Birdie Riddle absence

## 2022-08-08 NOTE — Telephone Encounter (Signed)
Spoke Cecilla from [facility] and gave verbal from Valarie Merino in Dr Birdie Riddle absence for pt to have Physical Therapy for twice a week for 8 weeks. Starting today for bed mobile training with gate belt.

## 2022-08-08 NOTE — Telephone Encounter (Signed)
Representative from [facility] called and stated that they had not received a claim for the hospital bed. I read the message from [facility] that the order was faxed to them on 3/15. The representative wanted to know which agency would be supplying the hospital bed, I let her know that I did not work directly with Dr Birdie Riddle and that her CMA would be in the office tomorrow. I let her know that from the looks of it she uses [facility].   [name], please give them a call sometime tomorrow to see what it is they are still missing

## 2022-08-08 NOTE — Telephone Encounter (Signed)
Caller name: Lorna Few from [place]?: Yes  Call back number: [phone]  Provider they see: [name], MD  Reason for call: Verbal Order: Physical Therapy for twice a week for 8 weeks. Starting today for bed mobile training with gate belt.

## 2022-08-08 NOTE — Telephone Encounter (Signed)
Yes mam I will . I called them Friday and called [employer] they stated they needed an approval letter from [facility] . I will call again and see what we need to do next

## 2022-08-09 DIAGNOSIS — L409 Psoriasis, unspecified: Secondary | ICD-10-CM | POA: Diagnosis not present

## 2022-08-09 DIAGNOSIS — G4733 Obstructive sleep apnea (adult) (pediatric): Secondary | ICD-10-CM | POA: Diagnosis not present

## 2022-08-09 DIAGNOSIS — G629 Polyneuropathy, unspecified: Secondary | ICD-10-CM | POA: Diagnosis not present

## 2022-08-09 DIAGNOSIS — Z9181 History of falling: Secondary | ICD-10-CM | POA: Diagnosis not present

## 2022-08-09 DIAGNOSIS — Z87440 Personal history of urinary (tract) infections: Secondary | ICD-10-CM | POA: Diagnosis not present

## 2022-08-09 DIAGNOSIS — K760 Fatty (change of) liver, not elsewhere classified: Secondary | ICD-10-CM | POA: Diagnosis not present

## 2022-08-09 DIAGNOSIS — K219 Gastro-esophageal reflux disease without esophagitis: Secondary | ICD-10-CM | POA: Diagnosis not present

## 2022-08-09 DIAGNOSIS — M5136 Other intervertebral disc degeneration, lumbar region: Secondary | ICD-10-CM | POA: Diagnosis not present

## 2022-08-09 DIAGNOSIS — G9529 Other cord compression: Secondary | ICD-10-CM | POA: Diagnosis not present

## 2022-08-09 DIAGNOSIS — M4802 Spinal stenosis, cervical region: Secondary | ICD-10-CM | POA: Diagnosis not present

## 2022-08-09 DIAGNOSIS — I872 Venous insufficiency (chronic) (peripheral): Secondary | ICD-10-CM | POA: Diagnosis not present

## 2022-08-09 DIAGNOSIS — E538 Deficiency of other specified B group vitamins: Secondary | ICD-10-CM | POA: Diagnosis not present

## 2022-08-09 DIAGNOSIS — M300 Polyarteritis nodosa: Secondary | ICD-10-CM | POA: Diagnosis not present

## 2022-08-09 DIAGNOSIS — I10 Essential (primary) hypertension: Secondary | ICD-10-CM | POA: Diagnosis not present

## 2022-08-09 DIAGNOSIS — F32A Depression, unspecified: Secondary | ICD-10-CM | POA: Diagnosis not present

## 2022-08-09 DIAGNOSIS — G43909 Migraine, unspecified, not intractable, without status migrainosus: Secondary | ICD-10-CM | POA: Diagnosis not present

## 2022-08-09 DIAGNOSIS — E559 Vitamin D deficiency, unspecified: Secondary | ICD-10-CM | POA: Diagnosis not present

## 2022-08-09 DIAGNOSIS — M797 Fibromyalgia: Secondary | ICD-10-CM | POA: Diagnosis not present

## 2022-08-09 DIAGNOSIS — Z993 Dependence on wheelchair: Secondary | ICD-10-CM | POA: Diagnosis not present

## 2022-08-09 DIAGNOSIS — E785 Hyperlipidemia, unspecified: Secondary | ICD-10-CM | POA: Diagnosis not present

## 2022-08-09 NOTE — Telephone Encounter (Signed)
Spoke with Lyndee Leo at [name] this morning pt insurance does not need a PA for bed they need [facility] to send order and claim in . Called [employer] and spoke to [facility] in intake and she confirmed there PA team denied the request due to weight not meeting requirements. I advised her to look at Dr Birdie Riddle office notes that was attached to order and to please send this to the insurance as they state  no PA required . She states if they need anything else she will call me directly

## 2022-08-09 NOTE — Telephone Encounter (Signed)
Spoke to [place] at [facility] this morning pt does not require a PA for a new bed they need an order and claim number from [employer] . I called and spoke with Heather in intake at [place] . They state the BMI may not get the pt covered . Said the original order was denied due to not meeting qualifications I said they did not read Dr Virgil Benedict note that was attached to order . They are going to look over everything and if they any thing else she will call me personally . If not they will submit order again

## 2022-08-09 NOTE — Telephone Encounter (Signed)
Called [employer] this morning and spoke w/ Abby . They are unable to start a PA for the bed at this time due to system down . She told me to try back in a few hours so that is what I will do

## 2022-08-10 ENCOUNTER — Other Ambulatory Visit: Payer: Self-pay

## 2022-08-10 ENCOUNTER — Other Ambulatory Visit: Payer: Self-pay | Admitting: Family Medicine

## 2022-08-10 DIAGNOSIS — G9589 Other specified diseases of spinal cord: Secondary | ICD-10-CM

## 2022-08-10 MED ORDER — BIOTIN 10 MG PO TABS: 10.0000 mg | ORAL_TABLET | Freq: Every morning | ORAL | 1 refills | Status: DC

## 2022-08-10 MED ORDER — CALCIUM CITRATE 950 (200 CA) MG PO TABS: 1.0000 | ORAL_TABLET | Freq: Every day | ORAL | 3 refills | Status: DC

## 2022-08-10 MED ORDER — HYDROCODONE-ACETAMINOPHEN 10-325 MG PO TABS: 1.0000 | ORAL_TABLET | Freq: Three times a day (TID) | ORAL | 0 refills | Status: DC | PRN

## 2022-08-10 MED ORDER — DICLOFENAC SODIUM 1 % EX GEL: 4.0000 g | Freq: Four times a day (QID) | CUTANEOUS | 1 refills | Status: DC

## 2022-08-10 MED ORDER — ARMODAFINIL 250 MG PO TABS: 250.0000 mg | ORAL_TABLET | Freq: Two times a day (BID) | ORAL | 3 refills | Status: DC

## 2022-08-10 NOTE — Telephone Encounter (Signed)
Hydrocodone 10-325  LOV: 07/27/22 Last Refill:07/15/22 Upcoming appt: none   Armodafinil 250 mg   I do not have clearance to refill above Rx

## 2022-08-15 ENCOUNTER — Telehealth: Payer: Self-pay | Admitting: Family Medicine

## 2022-08-15 DIAGNOSIS — I10 Essential (primary) hypertension: Secondary | ICD-10-CM | POA: Diagnosis not present

## 2022-08-15 DIAGNOSIS — L409 Psoriasis, unspecified: Secondary | ICD-10-CM | POA: Diagnosis not present

## 2022-08-15 DIAGNOSIS — Z87440 Personal history of urinary (tract) infections: Secondary | ICD-10-CM | POA: Diagnosis not present

## 2022-08-15 DIAGNOSIS — G43909 Migraine, unspecified, not intractable, without status migrainosus: Secondary | ICD-10-CM | POA: Diagnosis not present

## 2022-08-15 DIAGNOSIS — E538 Deficiency of other specified B group vitamins: Secondary | ICD-10-CM | POA: Diagnosis not present

## 2022-08-15 DIAGNOSIS — G9529 Other cord compression: Secondary | ICD-10-CM | POA: Diagnosis not present

## 2022-08-15 DIAGNOSIS — M300 Polyarteritis nodosa: Secondary | ICD-10-CM | POA: Diagnosis not present

## 2022-08-15 DIAGNOSIS — K760 Fatty (change of) liver, not elsewhere classified: Secondary | ICD-10-CM | POA: Diagnosis not present

## 2022-08-15 DIAGNOSIS — Z9181 History of falling: Secondary | ICD-10-CM | POA: Diagnosis not present

## 2022-08-15 DIAGNOSIS — E785 Hyperlipidemia, unspecified: Secondary | ICD-10-CM | POA: Diagnosis not present

## 2022-08-15 DIAGNOSIS — M797 Fibromyalgia: Secondary | ICD-10-CM | POA: Diagnosis not present

## 2022-08-15 DIAGNOSIS — M5136 Other intervertebral disc degeneration, lumbar region: Secondary | ICD-10-CM | POA: Diagnosis not present

## 2022-08-15 DIAGNOSIS — E559 Vitamin D deficiency, unspecified: Secondary | ICD-10-CM | POA: Diagnosis not present

## 2022-08-15 DIAGNOSIS — G4733 Obstructive sleep apnea (adult) (pediatric): Secondary | ICD-10-CM | POA: Diagnosis not present

## 2022-08-15 DIAGNOSIS — G629 Polyneuropathy, unspecified: Secondary | ICD-10-CM | POA: Diagnosis not present

## 2022-08-15 DIAGNOSIS — M4802 Spinal stenosis, cervical region: Secondary | ICD-10-CM | POA: Diagnosis not present

## 2022-08-15 DIAGNOSIS — Z993 Dependence on wheelchair: Secondary | ICD-10-CM | POA: Diagnosis not present

## 2022-08-15 DIAGNOSIS — K219 Gastro-esophageal reflux disease without esophagitis: Secondary | ICD-10-CM | POA: Diagnosis not present

## 2022-08-15 DIAGNOSIS — I872 Venous insufficiency (chronic) (peripheral): Secondary | ICD-10-CM | POA: Diagnosis not present

## 2022-08-15 DIAGNOSIS — F32A Depression, unspecified: Secondary | ICD-10-CM | POA: Diagnosis not present

## 2022-08-15 NOTE — Telephone Encounter (Signed)
Charge sheet attached placed in front bin.

## 2022-08-15 NOTE — Telephone Encounter (Signed)
Home Health Verbal Orders  Agency: [facility]   Caller: Orders Corrdinator  Call back #:  Fax [phone]    Requesting   Reason for Request: Orders to be signed

## 2022-08-15 NOTE — Telephone Encounter (Signed)
Form has been placed in [place] folder.

## 2022-08-15 NOTE — Telephone Encounter (Signed)
Forms placed in Dr [name] to be signed folder  

## 2022-08-16 ENCOUNTER — Other Ambulatory Visit: Payer: Self-pay | Admitting: Family Medicine

## 2022-08-16 ENCOUNTER — Telehealth: Payer: Self-pay | Admitting: Family Medicine

## 2022-08-16 ENCOUNTER — Encounter: Payer: Self-pay | Admitting: Family Medicine

## 2022-08-16 DIAGNOSIS — B369 Superficial mycosis, unspecified: Secondary | ICD-10-CM

## 2022-08-16 DIAGNOSIS — I872 Venous insufficiency (chronic) (peripheral): Secondary | ICD-10-CM | POA: Diagnosis not present

## 2022-08-16 DIAGNOSIS — E559 Vitamin D deficiency, unspecified: Secondary | ICD-10-CM | POA: Diagnosis not present

## 2022-08-16 DIAGNOSIS — L409 Psoriasis, unspecified: Secondary | ICD-10-CM | POA: Diagnosis not present

## 2022-08-16 DIAGNOSIS — M5136 Other intervertebral disc degeneration, lumbar region: Secondary | ICD-10-CM | POA: Diagnosis not present

## 2022-08-16 DIAGNOSIS — G629 Polyneuropathy, unspecified: Secondary | ICD-10-CM | POA: Diagnosis not present

## 2022-08-16 DIAGNOSIS — E785 Hyperlipidemia, unspecified: Secondary | ICD-10-CM | POA: Diagnosis not present

## 2022-08-16 DIAGNOSIS — M797 Fibromyalgia: Secondary | ICD-10-CM | POA: Diagnosis not present

## 2022-08-16 DIAGNOSIS — I10 Essential (primary) hypertension: Secondary | ICD-10-CM | POA: Diagnosis not present

## 2022-08-16 DIAGNOSIS — G9529 Other cord compression: Secondary | ICD-10-CM | POA: Diagnosis not present

## 2022-08-16 DIAGNOSIS — M4802 Spinal stenosis, cervical region: Secondary | ICD-10-CM | POA: Diagnosis not present

## 2022-08-16 DIAGNOSIS — E538 Deficiency of other specified B group vitamins: Secondary | ICD-10-CM | POA: Diagnosis not present

## 2022-08-16 DIAGNOSIS — M300 Polyarteritis nodosa: Secondary | ICD-10-CM | POA: Diagnosis not present

## 2022-08-16 LAB — COLOGUARD

## 2022-08-16 NOTE — Telephone Encounter (Signed)
Forms faxed and placed in scan  

## 2022-08-16 NOTE — Telephone Encounter (Signed)
Forms signed and returned to [place] 

## 2022-08-16 NOTE — Telephone Encounter (Signed)
Received "for review" home health forms from [facility] no charge sheet attached please advise next steps if needed.  

## 2022-08-17 ENCOUNTER — Telehealth: Payer: Self-pay

## 2022-08-17 DIAGNOSIS — I872 Venous insufficiency (chronic) (peripheral): Secondary | ICD-10-CM | POA: Diagnosis not present

## 2022-08-17 DIAGNOSIS — M5136 Other intervertebral disc degeneration, lumbar region: Secondary | ICD-10-CM | POA: Diagnosis not present

## 2022-08-17 DIAGNOSIS — E538 Deficiency of other specified B group vitamins: Secondary | ICD-10-CM | POA: Diagnosis not present

## 2022-08-17 DIAGNOSIS — G629 Polyneuropathy, unspecified: Secondary | ICD-10-CM | POA: Diagnosis not present

## 2022-08-17 DIAGNOSIS — Z87440 Personal history of urinary (tract) infections: Secondary | ICD-10-CM | POA: Diagnosis not present

## 2022-08-17 DIAGNOSIS — F32A Depression, unspecified: Secondary | ICD-10-CM | POA: Diagnosis not present

## 2022-08-17 DIAGNOSIS — G43909 Migraine, unspecified, not intractable, without status migrainosus: Secondary | ICD-10-CM | POA: Diagnosis not present

## 2022-08-17 DIAGNOSIS — I10 Essential (primary) hypertension: Secondary | ICD-10-CM | POA: Diagnosis not present

## 2022-08-17 DIAGNOSIS — Z993 Dependence on wheelchair: Secondary | ICD-10-CM | POA: Diagnosis not present

## 2022-08-17 DIAGNOSIS — Z9181 History of falling: Secondary | ICD-10-CM | POA: Diagnosis not present

## 2022-08-17 DIAGNOSIS — M300 Polyarteritis nodosa: Secondary | ICD-10-CM | POA: Diagnosis not present

## 2022-08-17 DIAGNOSIS — K760 Fatty (change of) liver, not elsewhere classified: Secondary | ICD-10-CM | POA: Diagnosis not present

## 2022-08-17 DIAGNOSIS — E559 Vitamin D deficiency, unspecified: Secondary | ICD-10-CM | POA: Diagnosis not present

## 2022-08-17 DIAGNOSIS — K219 Gastro-esophageal reflux disease without esophagitis: Secondary | ICD-10-CM | POA: Diagnosis not present

## 2022-08-17 DIAGNOSIS — G4733 Obstructive sleep apnea (adult) (pediatric): Secondary | ICD-10-CM | POA: Diagnosis not present

## 2022-08-17 DIAGNOSIS — E785 Hyperlipidemia, unspecified: Secondary | ICD-10-CM | POA: Diagnosis not present

## 2022-08-17 DIAGNOSIS — L409 Psoriasis, unspecified: Secondary | ICD-10-CM | POA: Diagnosis not present

## 2022-08-17 DIAGNOSIS — M797 Fibromyalgia: Secondary | ICD-10-CM | POA: Diagnosis not present

## 2022-08-17 DIAGNOSIS — M4802 Spinal stenosis, cervical region: Secondary | ICD-10-CM | POA: Diagnosis not present

## 2022-08-17 DIAGNOSIS — G9529 Other cord compression: Secondary | ICD-10-CM | POA: Diagnosis not present

## 2022-08-17 MED ORDER — NYSTATIN 100000 UNIT/GM EX POWD: CUTANEOUS | 3 refills | Status: DC

## 2022-08-17 NOTE — Telephone Encounter (Signed)
Left results on pt VM  

## 2022-08-17 NOTE — Telephone Encounter (Signed)
-----   Message from [name], MD sent at 08/17/2022  7:26 AM EDT ----- Unfortunately a valid result was not able to be obtained.  They are recommending a different form of screening.  This is something we can discuss at a later date.

## 2022-08-17 NOTE — Telephone Encounter (Signed)
[facility] VISIT   Patient agreed to [facility] visit and is aware that copayment and coinsurance may apply. Patient was treated using telemedicine according to accepted telemedicine protocols.  Subjective:   Patient complains of yeast under belly  Patient Active Problem List   Diagnosis Date Noted   Nocturnal hypoxemia 06/21/2022   Elevated LFTs 05/06/2022   Obesity hypoventilation syndrome 05/06/2022   Acute respiratory failure with hypoxia 04/22/2022   Hypokalemia 04/22/2022   Leg cramps 01/12/2022   CAP (community acquired pneumonia) 08/12/2021   Fever of unknown origin 08/12/2021   Sacral pressure sore 07/06/2021   Vitamin B6 deficiency 06/08/2021   Myelomalacia of cervical cord 06/06/2021   Compressive cervical cord myelomalacia (HCC) 06/05/2021   B12 deficiency 06/05/2021   Folate deficiency 06/05/2021   Vitamin D deficiency 06/05/2021   Leg weakness, bilateral 06/04/2021   Cervical atypia 01/06/2021   Steatosis of liver 01/06/2021   Hyponatremia 07/27/2019   Sepsis 07/26/2019   Urinary urgency 04/04/2018   Chronic narcotic use 09/07/2016   Acute blood loss anemia 01/14/2016   Multiple gastric ulcers    PAN (polyarteritis nodosa) 11/24/2015   GERD (gastroesophageal reflux disease) 01/08/2014   Routine general medical examination at a health care facility 04/23/2013   Bariatric surgery status 10/31/2012   S/P skin biopsy 10/31/2012   Psoriasis 01/09/2012   DDD (degenerative disc disease), lumbar 11/30/2011   Migraine headache    [name] (obstructive sleep apnea) 11/16/2010   HYPERGLYCEMIA, FASTING 10/08/2009   CONTRACTURE OF TENDON 08/17/2009   PARESTHESIA, HANDS 12/23/2008   Leg pain, right 11/05/2008   ADVERSE DRUG REACTION 04/03/2008   PERIPHERAL EDEMA 03/26/2008   Obesity, morbid, BMI 50 or higher 01/03/2007   Cellulitis 01/03/2007   Hyperlipidemia 09/22/2006   Fibromyalgia 09/20/2006   Anxiety and depression 06/28/2006   Essential hypertension 06/28/2006    Social History   Tobacco Use   Smoking status: Never   Smokeless tobacco: Never  Substance Use Topics   Alcohol use: No    Current Outpatient Medications:    albuterol (VENTOLIN HFA) 108 (90 Base) MCG/ACT inhaler, Inhale 2 puffs into the lungs every 6 (six) hours as needed for wheezing or shortness of breath., Disp: 8 g, Rfl: 0   ALPRAZolam (XANAX) 0.5 MG tablet, TAKE 1 TABLET BY MOUTH 2 TIMES DAILY, Disp: 30 tablet, Rfl: 3   ARIPiprazole (ABILIFY) 15 MG tablet, TAKE 1 TABLET BY MOUTH EVERY DAY, Disp: 90 tablet, Rfl: 1   Armodafinil 250 MG tablet, Take 1 tablet (250 mg total) by mouth in the morning and at bedtime., Disp: 60 tablet, Rfl: 3   Biotin 10 MG TABS, Take 1 tablet (10 mg total) by mouth in the morning., Disp: 90 tablet, Rfl: 1   buPROPion (WELLBUTRIN XL) 300 MG 24 hr tablet, TAKE 1 TABLET BY MOUTH EVERY DAY, Disp: 30 tablet, Rfl: 2   calcium citrate (CALCITRATE - DOSED IN MG ELEMENTAL CALCIUM) 950 (200 Ca) MG tablet, Take 1 tablet (200 mg of elemental calcium total) by mouth daily., Disp: 30 tablet, Rfl: 3   clonazePAM (KLONOPIN) 1 MG tablet, TAKE 1 TABLET BY MOUTH AT BEDTIME, Disp: 30 tablet, Rfl: 3   cyanocobalamin (VITAMIN B12) 1000 MCG tablet, TAKE 1 TABLET BY MOUTH EVERY DAY, Disp: 30 tablet, Rfl: 2   cyclobenzaprine (FLEXERIL) 10 MG tablet, TAKE 2 TABLETS BY MOUTH 2 TIMES DAILY, Disp: 120 tablet, Rfl: 2   diclofenac Sodium (VOLTAREN) 1 % GEL, Apply 4 g topically 4 (four) times daily., Disp: 1 g,  Rfl: 1   DULoxetine (CYMBALTA) 60 MG capsule, Take 2 capsules (120 mg total) by mouth daily. (Patient taking differently: Take 60 mg by mouth 2 (two) times daily.), Disp: 240 capsule, Rfl: 2   ferrous sulfate (FEROSUL) 325 (65 FE) MG tablet, TAKE 1 TABLET BY MOUTH EVERY MORNING WITH breakfast, Disp: 30 tablet, Rfl: 2   folic acid (FOLVITE) 1 MG tablet, TAKE 1 TABLET BY MOUTH EVERY DAY, Disp: 30 tablet, Rfl: 2   gabapentin (NEURONTIN) 600 MG tablet, Take 1 tablet (600 mg total) by  mouth 3 (three) times daily. TAKE 2 TABLETS BY MOUTH 3 TIMES DAILY (Patient taking differently: Take 600 mg by mouth 3 (three) times daily.), Disp: 270 tablet, Rfl: 0   GNP VITAMIN B-1 100 MG tablet, TAKE 1 TABLET BY MOUTH EVERY DAY, Disp: 30 tablet, Rfl: 2   HYDROcodone-acetaminophen (NORCO) 10-325 MG tablet, Take 1 tablet by mouth every 8 (eight) hours as needed., Disp: 90 tablet, Rfl: 0   nebivolol (BYSTOLIC) 10 MG tablet, TAKE 1 TABLET BY MOUTH EVERY DAY, Disp: 30 tablet, Rfl: 2   nystatin (MYCOSTATIN/NYSTOP) powder, APPLY 1 APPLICATION TOPICALLY 3 TIMES DAILY, Disp: 60 g, Rfl: 3   Oxcarbazepine (TRILEPTAL) 300 MG tablet, Take 300 mg by mouth 2 (two) times daily., Disp: , Rfl:    pantoprazole (PROTONIX) 40 MG tablet, TAKE 1 TABLET BY MOUTH 2 TIMES DAILY, Disp: 60 tablet, Rfl: 2   phentermine 37.5 MG capsule, Take 1 capsule (37.5 mg total) by mouth every morning., Disp: 90 capsule, Rfl: 0   promethazine (PHENERGAN) 25 MG tablet, TAKE 1 TABLET BY MOUTH EVERY 6 HOURS AS NEEDED FOR NAUSEA AND VOMITING, Disp: 45 tablet, Rfl: 3   pyridOXINE (VITAMIN B6) 100 MG tablet, TAKE 1 TABLET BY MOUTH EVERY DAY, Disp: 30 tablet, Rfl: 2  Allergies  Allergen Reactions   Niacin Anaphylaxis, Shortness Of Breath and Swelling    Swelling and "problems breathing"   Sulfamethoxazole-Trimethoprim Anaphylaxis, Swelling, Rash and Other (See Comments)    Throat closed and the eyes became swollen   Aspirin Hives   Bactrim Hives   Benzoin Compound Rash   Cephalexin Hives    Tolerated ceftriaxone and cefepime 07/2019   Iohexol Hives    Pt treated with PO benedryl   Lisinopril Hives   Naproxen Hives   Pnu-Imune [Pneumococcal Polysaccharide Vaccine] Rash   Sulfonamide Derivatives Rash    Assessment and Plan:   Diagnosis: fungal dermatitis. Please see myChart communication and orders below.   No orders of the defined types were placed in this encounter.  Meds ordered this encounter  Medications   nystatin  (MYCOSTATIN/NYSTOP) powder    Sig: APPLY 1 APPLICATION TOPICALLY 3 TIMES DAILY    Dispense:  60 g    Refill:  3    [name], MD 08/17/2022  A total of 5 minutes were spent by me to personally review the patient-generated inquiry, review patient records and data pertinent to assessment of the patient's problem, develop a management plan including generation of prescriptions and/or orders, and on subsequent communication with the patient through secure the MyChart portal service.   There is no separately reported E/M service related to this service in the past 7 days nor does the patient have an upcoming soonest available appointment for this issue. This work was completed in less than 7 days.   The patient consented to this service today (see patient agreement prior to ongoing communication). Patient counseled regarding the need for in-person exam for certain  conditions and was advised to call the office if any changing or worsening symptoms occur.   The codes to be used for the E/M service are: [x]   99421 for 5-10 minutes of time spent on the inquiry. []   [id number] for 11-20 minutes. []   [id number] for 21+ minutes.

## 2022-08-17 NOTE — Telephone Encounter (Signed)
Patient is requesting a refill of the following medications: Requested Prescriptions   Pending Prescriptions Disp Refills   promethazine (PHENERGAN) 25 MG tablet [Pharmacy Med Name: promethazine 25 mg tablet] 45 tablet 3    Sig: TAKE 1 TABLET BY MOUTH EVERY 6 HOURS AS NEEDED FOR NAUSEA AND VOMITING    Date of patient request: 08/17/2022 Last office visit: 07/26/22 Date of last refill: 11/11/22 Last refill amount: 45 Follow up time period per chart: 1 month

## 2022-08-19 ENCOUNTER — Telehealth (INDEPENDENT_AMBULATORY_CARE_PROVIDER_SITE_OTHER): Payer: 59 | Admitting: Family Medicine

## 2022-08-19 ENCOUNTER — Encounter: Payer: Self-pay | Admitting: Family Medicine

## 2022-08-19 VITALS — Ht 62.0 in | Wt 241.4 lb

## 2022-08-19 DIAGNOSIS — B369 Superficial mycosis, unspecified: Secondary | ICD-10-CM

## 2022-08-19 NOTE — Progress Notes (Signed)
Virtual Visit via Video   I connected with patient on 08/19/22 at 10:00 AM EDT by a video enabled telemedicine application and verified that I am speaking with the correct person using two identifiers.  Location patient: Home [facility] provider: [facility] Persons participating in the virtual visit: Patient, Provider, CMA Sheryle Hail(Diamond C)  I discussed the limitations of evaluation and management by telemedicine and the availability of in person appointments. The patient expressed understanding and agreed to proceed.  Subjective:   HPI:   Fungal Dermatitis- pt has had issues w/ this in the past.  Sxs worsen as weather warms.  Develops yeast in skin folds.  Was prescribed Nystatin powder earlier this week.  Pt reports redness is improving, no longer hot to the touch.    ROS:   See pertinent positives and negatives per HPI.  Patient Active Problem List   Diagnosis Date Noted   Nocturnal hypoxemia 06/21/2022   Elevated LFTs 05/06/2022   Obesity hypoventilation syndrome 05/06/2022   Acute respiratory failure with hypoxia 04/22/2022   Hypokalemia 04/22/2022   Leg cramps 01/12/2022   CAP (community acquired pneumonia) 08/12/2021   Fever of unknown origin 08/12/2021   Sacral pressure sore 07/06/2021   Vitamin B6 deficiency 06/08/2021   Myelomalacia of cervical cord 06/06/2021   Compressive cervical cord myelomalacia (HCC) 06/05/2021   B12 deficiency 06/05/2021   Folate deficiency 06/05/2021   Vitamin D deficiency 06/05/2021   Leg weakness, bilateral 06/04/2021   Cervical atypia 01/06/2021   Steatosis of liver 01/06/2021   Hyponatremia 07/27/2019   Sepsis 07/26/2019   Urinary urgency 04/04/2018   Chronic narcotic use 09/07/2016   Acute blood loss anemia 01/14/2016   Multiple gastric ulcers    PAN (polyarteritis nodosa) 11/24/2015   GERD (gastroesophageal reflux disease) 01/08/2014   Routine general medical examination at a health care facility 04/23/2013    Bariatric surgery status 10/31/2012   S/P skin biopsy 10/31/2012   Psoriasis 01/09/2012   DDD (degenerative disc disease), lumbar 11/30/2011   Migraine headache    [name] (obstructive sleep apnea) 11/16/2010   HYPERGLYCEMIA, FASTING 10/08/2009   CONTRACTURE OF TENDON 08/17/2009   PARESTHESIA, HANDS 12/23/2008   Leg pain, right 11/05/2008   ADVERSE DRUG REACTION 04/03/2008   PERIPHERAL EDEMA 03/26/2008   Obesity, morbid, BMI 50 or higher 01/03/2007   Cellulitis 01/03/2007   Hyperlipidemia 09/22/2006   Fibromyalgia 09/20/2006   Anxiety and depression 06/28/2006   Essential hypertension 06/28/2006    Social History   Tobacco Use   Smoking status: Never   Smokeless tobacco: Never  Substance Use Topics   Alcohol use: No    Current Outpatient Medications:    albuterol (VENTOLIN HFA) 108 (90 Base) MCG/ACT inhaler, Inhale 2 puffs into the lungs every 6 (six) hours as needed for wheezing or shortness of breath., Disp: 8 g, Rfl: 0   ALPRAZolam (XANAX) 0.5 MG tablet, TAKE 1 TABLET BY MOUTH 2 TIMES DAILY, Disp: 30 tablet, Rfl: 3   ARIPiprazole (ABILIFY) 15 MG tablet, TAKE 1 TABLET BY MOUTH EVERY DAY, Disp: 90 tablet, Rfl: 1   Armodafinil 250 MG tablet, Take 1 tablet (250 mg total) by mouth in the morning and at bedtime., Disp: 60 tablet, Rfl: 3   Biotin 10 MG TABS, Take 1 tablet (10 mg total) by mouth in the morning., Disp: 90 tablet, Rfl: 1   buPROPion (WELLBUTRIN XL) 300 MG 24 hr tablet, TAKE 1 TABLET BY MOUTH EVERY DAY, Disp: 30 tablet, Rfl: 2   calcium  citrate (CALCITRATE - DOSED IN MG ELEMENTAL CALCIUM) 950 (200 Ca) MG tablet, Take 1 tablet (200 mg of elemental calcium total) by mouth daily., Disp: 30 tablet, Rfl: 3   clonazePAM (KLONOPIN) 1 MG tablet, TAKE 1 TABLET BY MOUTH AT BEDTIME, Disp: 30 tablet, Rfl: 3   cyanocobalamin (VITAMIN B12) 1000 MCG tablet, TAKE 1 TABLET BY MOUTH EVERY DAY, Disp: 30 tablet, Rfl: 2   cyclobenzaprine (FLEXERIL) 10 MG tablet, TAKE 2 TABLETS BY MOUTH 2 TIMES  DAILY, Disp: 120 tablet, Rfl: 2   diclofenac Sodium (VOLTAREN) 1 % GEL, Apply 4 g topically 4 (four) times daily., Disp: 1 g, Rfl: 1   DULoxetine (CYMBALTA) 60 MG capsule, Take 2 capsules (120 mg total) by mouth daily. (Patient taking differently: Take 60 mg by mouth 2 (two) times daily.), Disp: 240 capsule, Rfl: 2   ferrous sulfate (FEROSUL) 325 (65 FE) MG tablet, TAKE 1 TABLET BY MOUTH EVERY MORNING WITH breakfast, Disp: 30 tablet, Rfl: 2   folic acid (FOLVITE) 1 MG tablet, TAKE 1 TABLET BY MOUTH EVERY DAY, Disp: 30 tablet, Rfl: 2   gabapentin (NEURONTIN) 600 MG tablet, Take 1 tablet (600 mg total) by mouth 3 (three) times daily. TAKE 2 TABLETS BY MOUTH 3 TIMES DAILY (Patient taking differently: Take 600 mg by mouth 3 (three) times daily.), Disp: 270 tablet, Rfl: 0   GNP VITAMIN B-1 100 MG tablet, TAKE 1 TABLET BY MOUTH EVERY DAY, Disp: 30 tablet, Rfl: 2   HYDROcodone-acetaminophen (NORCO) 10-325 MG tablet, Take 1 tablet by mouth every 8 (eight) hours as needed., Disp: 90 tablet, Rfl: 0   nebivolol (BYSTOLIC) 10 MG tablet, TAKE 1 TABLET BY MOUTH EVERY DAY, Disp: 30 tablet, Rfl: 2   nystatin (MYCOSTATIN/NYSTOP) powder, APPLY 1 APPLICATION TOPICALLY 3 TIMES DAILY, Disp: 60 g, Rfl: 3   Oxcarbazepine (TRILEPTAL) 300 MG tablet, Take 300 mg by mouth 2 (two) times daily., Disp: , Rfl:    pantoprazole (PROTONIX) 40 MG tablet, TAKE 1 TABLET BY MOUTH 2 TIMES DAILY, Disp: 60 tablet, Rfl: 2   phentermine 37.5 MG capsule, Take 1 capsule (37.5 mg total) by mouth every morning., Disp: 90 capsule, Rfl: 0   promethazine (PHENERGAN) 25 MG tablet, TAKE 1 TABLET BY MOUTH EVERY 6 HOURS AS NEEDED FOR NAUSEA AND VOMITING, Disp: 45 tablet, Rfl: 3   pyridOXINE (VITAMIN B6) 100 MG tablet, TAKE 1 TABLET BY MOUTH EVERY DAY, Disp: 30 tablet, Rfl: 2  Allergies  Allergen Reactions   Niacin Anaphylaxis, Shortness Of Breath and Swelling    Swelling and "problems breathing"   Sulfamethoxazole-Trimethoprim Anaphylaxis, Swelling,  Rash and Other (See Comments)    Throat closed and the eyes became swollen   Aspirin Hives   Bactrim Hives   Benzoin Compound Rash   Cephalexin Hives    Tolerated ceftriaxone and cefepime 07/2019   Iohexol Hives    Pt treated with PO benedryl   Lisinopril Hives   Naproxen Hives   Pnu-Imune [Pneumococcal Polysaccharide Vaccine] Rash   Sulfonamide Derivatives Rash    Objective:   Ht 5\' 2"  (1.575 m)   Wt 241 lb 6 oz (109.5 kg)   BMI 44.15 kg/m  AAOx3, NAD NCAT, EOMI No obvious CN deficits Coloring WNL Pt is able to speak clearly, coherently without shortness of breath or increased work of breathing.  Thought process is linear.  Mood is appropriate.   Assessment and Plan:   Fungal dermatitis- improving w/ addition of Nystatin powder.  Encouraged pt to use regularly and  to also use a dry towel to pat the areas and ensure they are dry throughout the day.  Pt expressed understanding and is in agreement w/ plan.    Neena Rhymes, MD 08/19/2022

## 2022-08-22 ENCOUNTER — Telehealth: Payer: Self-pay | Admitting: Family Medicine

## 2022-08-22 ENCOUNTER — Encounter: Payer: Self-pay | Admitting: Family Medicine

## 2022-08-22 DIAGNOSIS — G9529 Other cord compression: Secondary | ICD-10-CM | POA: Diagnosis not present

## 2022-08-22 DIAGNOSIS — G43909 Migraine, unspecified, not intractable, without status migrainosus: Secondary | ICD-10-CM | POA: Diagnosis not present

## 2022-08-22 DIAGNOSIS — E559 Vitamin D deficiency, unspecified: Secondary | ICD-10-CM | POA: Diagnosis not present

## 2022-08-22 DIAGNOSIS — F32A Depression, unspecified: Secondary | ICD-10-CM | POA: Diagnosis not present

## 2022-08-22 DIAGNOSIS — M797 Fibromyalgia: Secondary | ICD-10-CM | POA: Diagnosis not present

## 2022-08-22 DIAGNOSIS — Z87440 Personal history of urinary (tract) infections: Secondary | ICD-10-CM | POA: Diagnosis not present

## 2022-08-22 DIAGNOSIS — I872 Venous insufficiency (chronic) (peripheral): Secondary | ICD-10-CM | POA: Diagnosis not present

## 2022-08-22 DIAGNOSIS — K760 Fatty (change of) liver, not elsewhere classified: Secondary | ICD-10-CM | POA: Diagnosis not present

## 2022-08-22 DIAGNOSIS — M5136 Other intervertebral disc degeneration, lumbar region: Secondary | ICD-10-CM | POA: Diagnosis not present

## 2022-08-22 DIAGNOSIS — E785 Hyperlipidemia, unspecified: Secondary | ICD-10-CM | POA: Diagnosis not present

## 2022-08-22 DIAGNOSIS — M300 Polyarteritis nodosa: Secondary | ICD-10-CM | POA: Diagnosis not present

## 2022-08-22 DIAGNOSIS — L409 Psoriasis, unspecified: Secondary | ICD-10-CM | POA: Diagnosis not present

## 2022-08-22 DIAGNOSIS — Z993 Dependence on wheelchair: Secondary | ICD-10-CM | POA: Diagnosis not present

## 2022-08-22 DIAGNOSIS — M4802 Spinal stenosis, cervical region: Secondary | ICD-10-CM | POA: Diagnosis not present

## 2022-08-22 DIAGNOSIS — K219 Gastro-esophageal reflux disease without esophagitis: Secondary | ICD-10-CM | POA: Diagnosis not present

## 2022-08-22 DIAGNOSIS — Z9181 History of falling: Secondary | ICD-10-CM | POA: Diagnosis not present

## 2022-08-22 DIAGNOSIS — E538 Deficiency of other specified B group vitamins: Secondary | ICD-10-CM | POA: Diagnosis not present

## 2022-08-22 DIAGNOSIS — G4733 Obstructive sleep apnea (adult) (pediatric): Secondary | ICD-10-CM | POA: Diagnosis not present

## 2022-08-22 DIAGNOSIS — G629 Polyneuropathy, unspecified: Secondary | ICD-10-CM | POA: Diagnosis not present

## 2022-08-22 DIAGNOSIS — I10 Essential (primary) hypertension: Secondary | ICD-10-CM | POA: Diagnosis not present

## 2022-08-22 NOTE — Telephone Encounter (Signed)
Pt called wanting to know if Sheryle Hail got everything she needed

## 2022-08-22 NOTE — Telephone Encounter (Signed)
I have informed pt I do not need anything else for the order for the hosp bed . Ever thing has been sent to [facility] now it is between them and [facility] insurance pt is aware of this as I explained this to her this morning

## 2022-08-22 NOTE — Telephone Encounter (Signed)
I spoke to the pt and she states the notes that she needs a bed due to weight and I explained to her that we documented that in Dr Beverely Low note that was attached to order that was sent to [facility] . I told the pt I spoke to [facility] in intake for [facility] on 08/11/22 and she stated if she needed anything from [place] she would reach out and she has not . I explained to Ms [name] [facility] needs to send the order to [facility]  and as of March 28,2024 they had not that prompted the call to [facility] at [facility] . Pt is reaching out to them

## 2022-08-22 NOTE — Telephone Encounter (Signed)
Caller name: MAYKALA OCKER  On DPR?: Yes  Call back number: [phone] (home)  Provider they see: Sheliah Hatch, MD  Reason for call: Hospital bed she has the information Sheryle Hail needs.

## 2022-08-23 ENCOUNTER — Encounter: Payer: Self-pay | Admitting: Family Medicine

## 2022-08-23 NOTE — Telephone Encounter (Signed)
error 

## 2022-08-24 ENCOUNTER — Telehealth (INDEPENDENT_AMBULATORY_CARE_PROVIDER_SITE_OTHER): Payer: 59 | Admitting: Family Medicine

## 2022-08-24 ENCOUNTER — Encounter: Payer: Self-pay | Admitting: Family Medicine

## 2022-08-24 DIAGNOSIS — R1031 Right lower quadrant pain: Secondary | ICD-10-CM

## 2022-08-24 NOTE — Progress Notes (Signed)
Virtual Visit via Video   I connected with patient on 08/24/22 at 11:00 AM EDT by a video enabled telemedicine application and verified that I am speaking with the correct person using two identifiers.  Location patient: Home [facility]: [facility] Persons participating in the virtual visit: Patient, Provider, CMA Sheryle Hail(Diamond C)  I discussed the limitations of evaluation and management by telemedicine and the availability of in person appointments. The patient expressed understanding and agreed to proceed.  Subjective:   HPI:   RLQ pain- pt reports she developed sharp RLQ pain yesterday.  Was painful to touch.  No nausea or vomiting.  Pt reports moving around is 'very uncomfortable'.  No constipation or diarrhea.  Pt reports pain extends from hip to belly button.  Pt states there is no pain if someone bumps or wiggles the bed.  Pain seems to occur w/ contracting of abd muscles.  Pt is working on PT to strengthen core muscle.  ROS:   See pertinent positives and negatives per HPI.  Patient Active Problem List   Diagnosis Date Noted   Nocturnal hypoxemia 06/21/2022   Elevated LFTs 05/06/2022   Obesity hypoventilation syndrome 05/06/2022   Acute respiratory failure with hypoxia 04/22/2022   Hypokalemia 04/22/2022   Leg cramps 01/12/2022   CAP (community acquired pneumonia) 08/12/2021   Fever of unknown origin 08/12/2021   Sacral pressure sore 07/06/2021   Vitamin B6 deficiency 06/08/2021   Myelomalacia of cervical cord 06/06/2021   Compressive cervical cord myelomalacia (HCC) 06/05/2021   B12 deficiency 06/05/2021   Folate deficiency 06/05/2021   Vitamin D deficiency 06/05/2021   Leg weakness, bilateral 06/04/2021   Cervical atypia 01/06/2021   Steatosis of liver 01/06/2021   Hyponatremia 07/27/2019   Sepsis 07/26/2019   Urinary urgency 04/04/2018   Chronic narcotic use 09/07/2016   Acute blood loss anemia 01/14/2016   Multiple gastric ulcers    PAN  (polyarteritis nodosa) 11/24/2015   GERD (gastroesophageal reflux disease) 01/08/2014   Routine general medical examination at a health care facility 04/23/2013   Bariatric surgery status 10/31/2012   S/P skin biopsy 10/31/2012   Psoriasis 01/09/2012   DDD (degenerative disc disease), lumbar 11/30/2011   Migraine headache    [name] (obstructive sleep apnea) 11/16/2010   HYPERGLYCEMIA, FASTING 10/08/2009   CONTRACTURE OF TENDON 08/17/2009   PARESTHESIA, HANDS 12/23/2008   Leg pain, right 11/05/2008   ADVERSE DRUG REACTION 04/03/2008   PERIPHERAL EDEMA 03/26/2008   Obesity, morbid, BMI 50 or higher 01/03/2007   Cellulitis 01/03/2007   Hyperlipidemia 09/22/2006   Fibromyalgia 09/20/2006   Anxiety and depression 06/28/2006   Essential hypertension 06/28/2006    Social History   Tobacco Use   Smoking status: Never   Smokeless tobacco: Never  Substance Use Topics   Alcohol use: No    Current Outpatient Medications:    albuterol (VENTOLIN HFA) 108 (90 Base) MCG/ACT inhaler, Inhale 2 puffs into the lungs every 6 (six) hours as needed for wheezing or shortness of breath., Disp: 8 g, Rfl: 0   ALPRAZolam (XANAX) 0.5 MG tablet, TAKE 1 TABLET BY MOUTH 2 TIMES DAILY, Disp: 30 tablet, Rfl: 3   ARIPiprazole (ABILIFY) 15 MG tablet, TAKE 1 TABLET BY MOUTH EVERY DAY, Disp: 90 tablet, Rfl: 1   Armodafinil 250 MG tablet, Take 1 tablet (250 mg total) by mouth in the morning and at bedtime., Disp: 60 tablet, Rfl: 3   Biotin 10 MG TABS, Take 1 tablet (10 mg total) by mouth in  the morning., Disp: 90 tablet, Rfl: 1   buPROPion (WELLBUTRIN XL) 300 MG 24 hr tablet, TAKE 1 TABLET BY MOUTH EVERY DAY, Disp: 30 tablet, Rfl: 2   calcium citrate (CALCITRATE - DOSED IN MG ELEMENTAL CALCIUM) 950 (200 Ca) MG tablet, Take 1 tablet (200 mg of elemental calcium total) by mouth daily., Disp: 30 tablet, Rfl: 3   clonazePAM (KLONOPIN) 1 MG tablet, TAKE 1 TABLET BY MOUTH AT BEDTIME, Disp: 30 tablet, Rfl: 3   cyanocobalamin  (VITAMIN B12) 1000 MCG tablet, TAKE 1 TABLET BY MOUTH EVERY DAY, Disp: 30 tablet, Rfl: 2   cyclobenzaprine (FLEXERIL) 10 MG tablet, TAKE 2 TABLETS BY MOUTH 2 TIMES DAILY, Disp: 120 tablet, Rfl: 2   diclofenac Sodium (VOLTAREN) 1 % GEL, Apply 4 g topically 4 (four) times daily., Disp: 1 g, Rfl: 1   DULoxetine (CYMBALTA) 60 MG capsule, Take 2 capsules (120 mg total) by mouth daily. (Patient taking differently: Take 60 mg by mouth 2 (two) times daily.), Disp: 240 capsule, Rfl: 2   ferrous sulfate (FEROSUL) 325 (65 FE) MG tablet, TAKE 1 TABLET BY MOUTH EVERY MORNING WITH breakfast, Disp: 30 tablet, Rfl: 2   folic acid (FOLVITE) 1 MG tablet, TAKE 1 TABLET BY MOUTH EVERY DAY, Disp: 30 tablet, Rfl: 2   gabapentin (NEURONTIN) 600 MG tablet, Take 1 tablet (600 mg total) by mouth 3 (three) times daily. TAKE 2 TABLETS BY MOUTH 3 TIMES DAILY (Patient taking differently: Take 600 mg by mouth 3 (three) times daily.), Disp: 270 tablet, Rfl: 0   GNP VITAMIN B-1 100 MG tablet, TAKE 1 TABLET BY MOUTH EVERY DAY, Disp: 30 tablet, Rfl: 2   HYDROcodone-acetaminophen (NORCO) 10-325 MG tablet, Take 1 tablet by mouth every 8 (eight) hours as needed., Disp: 90 tablet, Rfl: 0   nebivolol (BYSTOLIC) 10 MG tablet, TAKE 1 TABLET BY MOUTH EVERY DAY, Disp: 30 tablet, Rfl: 2   nystatin (MYCOSTATIN/NYSTOP) powder, APPLY 1 APPLICATION TOPICALLY 3 TIMES DAILY, Disp: 60 g, Rfl: 3   Oxcarbazepine (TRILEPTAL) 300 MG tablet, Take 300 mg by mouth 2 (two) times daily., Disp: , Rfl:    pantoprazole (PROTONIX) 40 MG tablet, TAKE 1 TABLET BY MOUTH 2 TIMES DAILY, Disp: 60 tablet, Rfl: 2   phentermine 37.5 MG capsule, Take 1 capsule (37.5 mg total) by mouth every morning., Disp: 90 capsule, Rfl: 0   promethazine (PHENERGAN) 25 MG tablet, TAKE 1 TABLET BY MOUTH EVERY 6 HOURS AS NEEDED FOR NAUSEA AND VOMITING, Disp: 45 tablet, Rfl: 3   pyridOXINE (VITAMIN B6) 100 MG tablet, TAKE 1 TABLET BY MOUTH EVERY DAY, Disp: 30 tablet, Rfl: 2  Allergies   Allergen Reactions   Niacin Anaphylaxis, Shortness Of Breath and Swelling    Swelling and "problems breathing"   Sulfamethoxazole-Trimethoprim Anaphylaxis, Swelling, Rash and Other (See Comments)    Throat closed and the eyes became swollen   Aspirin Hives   Bactrim Hives   Benzoin Compound Rash   Cephalexin Hives    Tolerated ceftriaxone and cefepime 07/2019   Iohexol Hives    Pt treated with PO benedryl   Lisinopril Hives   Naproxen Hives   Pnu-Imune [Pneumococcal Polysaccharide Vaccine] Rash   Sulfonamide Derivatives Rash    Objective:   There were no vitals taken for this visit. AA, NAD NCAT, EOMI No obvious CN deficits Coloring WNL Pt is able to speak without shortness of breath or increased work of breathing. Is slurring words Mood is appropriate.   Assessment and Plan:  RLQ pain- new.  Pt reports pain started suddenly yesterday but then resolved.  At this time is only having discomfort when she contracts her abdominal muscles to try and sit up or change position.  No pain when bed is moved or area is palpated.  No N/V/D, no constipation.  No fevers or chills.  She reports she has been working w/ PT on core strengthening which would further support an abdominal wall strain.  Did review the signs of appendicitis and that she would need to go to the ER immediately if any of those presented themselves.  Pt expressed understanding and is in agreement w/ plan.    Neena Rhymes, MD 08/24/2022

## 2022-08-25 ENCOUNTER — Telehealth: Payer: Self-pay | Admitting: Family Medicine

## 2022-08-25 NOTE — Telephone Encounter (Signed)
Called to discuss, noted she is currently speaking with them and she will call back if they do not come up with the order this has been faxed several times so we are just at a loss of what more to do to get this to them

## 2022-08-25 NOTE — Telephone Encounter (Signed)
Pt states we need to call and discuss the order with [employer] [phone] to discuss the coverage

## 2022-08-25 NOTE — Telephone Encounter (Signed)
Caller name: [name]  On DPR?: Yes  Call back number: [phone] (home)  Provider they see: Sheliah Hatch, MD  Reason for call: Checking on status of hospital bed

## 2022-08-26 DIAGNOSIS — M4802 Spinal stenosis, cervical region: Secondary | ICD-10-CM | POA: Diagnosis not present

## 2022-08-26 DIAGNOSIS — E538 Deficiency of other specified B group vitamins: Secondary | ICD-10-CM | POA: Diagnosis not present

## 2022-08-26 DIAGNOSIS — L409 Psoriasis, unspecified: Secondary | ICD-10-CM | POA: Diagnosis not present

## 2022-08-26 DIAGNOSIS — K760 Fatty (change of) liver, not elsewhere classified: Secondary | ICD-10-CM | POA: Diagnosis not present

## 2022-08-26 DIAGNOSIS — Z993 Dependence on wheelchair: Secondary | ICD-10-CM | POA: Diagnosis not present

## 2022-08-26 DIAGNOSIS — M5136 Other intervertebral disc degeneration, lumbar region: Secondary | ICD-10-CM | POA: Diagnosis not present

## 2022-08-26 DIAGNOSIS — G4733 Obstructive sleep apnea (adult) (pediatric): Secondary | ICD-10-CM | POA: Diagnosis not present

## 2022-08-26 DIAGNOSIS — G9529 Other cord compression: Secondary | ICD-10-CM | POA: Diagnosis not present

## 2022-08-26 DIAGNOSIS — F32A Depression, unspecified: Secondary | ICD-10-CM | POA: Diagnosis not present

## 2022-08-26 DIAGNOSIS — G43909 Migraine, unspecified, not intractable, without status migrainosus: Secondary | ICD-10-CM | POA: Diagnosis not present

## 2022-08-26 DIAGNOSIS — I872 Venous insufficiency (chronic) (peripheral): Secondary | ICD-10-CM | POA: Diagnosis not present

## 2022-08-26 DIAGNOSIS — Z87440 Personal history of urinary (tract) infections: Secondary | ICD-10-CM | POA: Diagnosis not present

## 2022-08-26 DIAGNOSIS — K219 Gastro-esophageal reflux disease without esophagitis: Secondary | ICD-10-CM | POA: Diagnosis not present

## 2022-08-26 DIAGNOSIS — Z9181 History of falling: Secondary | ICD-10-CM | POA: Diagnosis not present

## 2022-08-26 DIAGNOSIS — M797 Fibromyalgia: Secondary | ICD-10-CM | POA: Diagnosis not present

## 2022-08-26 DIAGNOSIS — G629 Polyneuropathy, unspecified: Secondary | ICD-10-CM | POA: Diagnosis not present

## 2022-08-26 DIAGNOSIS — E559 Vitamin D deficiency, unspecified: Secondary | ICD-10-CM | POA: Diagnosis not present

## 2022-08-26 DIAGNOSIS — I10 Essential (primary) hypertension: Secondary | ICD-10-CM | POA: Diagnosis not present

## 2022-08-26 DIAGNOSIS — M300 Polyarteritis nodosa: Secondary | ICD-10-CM | POA: Diagnosis not present

## 2022-08-26 DIAGNOSIS — E785 Hyperlipidemia, unspecified: Secondary | ICD-10-CM | POA: Diagnosis not present

## 2022-08-26 NOTE — Telephone Encounter (Signed)
I will call insurance this afternoon and see what they say this time . On March 25,2024 they clearly stated [facility] needed to send the order and they need to obtain a PA but [facility] said it did not require a PA .

## 2022-08-26 NOTE — Telephone Encounter (Signed)
Spoke w/  Matt at [facility] and he states they do hospital beds for pt with bed sores but he ask me to fax the order and office notes to [phone].  They have been faxed to that number .

## 2022-08-28 DIAGNOSIS — E785 Hyperlipidemia, unspecified: Secondary | ICD-10-CM | POA: Diagnosis not present

## 2022-08-28 DIAGNOSIS — G4733 Obstructive sleep apnea (adult) (pediatric): Secondary | ICD-10-CM | POA: Diagnosis not present

## 2022-08-28 DIAGNOSIS — M4802 Spinal stenosis, cervical region: Secondary | ICD-10-CM | POA: Diagnosis not present

## 2022-08-28 DIAGNOSIS — Z87440 Personal history of urinary (tract) infections: Secondary | ICD-10-CM | POA: Diagnosis not present

## 2022-08-28 DIAGNOSIS — K219 Gastro-esophageal reflux disease without esophagitis: Secondary | ICD-10-CM | POA: Diagnosis not present

## 2022-08-28 DIAGNOSIS — I872 Venous insufficiency (chronic) (peripheral): Secondary | ICD-10-CM | POA: Diagnosis not present

## 2022-08-28 DIAGNOSIS — M797 Fibromyalgia: Secondary | ICD-10-CM | POA: Diagnosis not present

## 2022-08-28 DIAGNOSIS — M300 Polyarteritis nodosa: Secondary | ICD-10-CM | POA: Diagnosis not present

## 2022-08-28 DIAGNOSIS — E538 Deficiency of other specified B group vitamins: Secondary | ICD-10-CM | POA: Diagnosis not present

## 2022-08-28 DIAGNOSIS — K760 Fatty (change of) liver, not elsewhere classified: Secondary | ICD-10-CM | POA: Diagnosis not present

## 2022-08-28 DIAGNOSIS — Z993 Dependence on wheelchair: Secondary | ICD-10-CM | POA: Diagnosis not present

## 2022-08-28 DIAGNOSIS — F32A Depression, unspecified: Secondary | ICD-10-CM | POA: Diagnosis not present

## 2022-08-28 DIAGNOSIS — G9529 Other cord compression: Secondary | ICD-10-CM | POA: Diagnosis not present

## 2022-08-28 DIAGNOSIS — M5136 Other intervertebral disc degeneration, lumbar region: Secondary | ICD-10-CM | POA: Diagnosis not present

## 2022-08-28 DIAGNOSIS — G43909 Migraine, unspecified, not intractable, without status migrainosus: Secondary | ICD-10-CM | POA: Diagnosis not present

## 2022-08-28 DIAGNOSIS — L409 Psoriasis, unspecified: Secondary | ICD-10-CM | POA: Diagnosis not present

## 2022-08-28 DIAGNOSIS — G629 Polyneuropathy, unspecified: Secondary | ICD-10-CM | POA: Diagnosis not present

## 2022-08-28 DIAGNOSIS — I10 Essential (primary) hypertension: Secondary | ICD-10-CM | POA: Diagnosis not present

## 2022-08-28 DIAGNOSIS — Z9181 History of falling: Secondary | ICD-10-CM | POA: Diagnosis not present

## 2022-08-28 DIAGNOSIS — E559 Vitamin D deficiency, unspecified: Secondary | ICD-10-CM | POA: Diagnosis not present

## 2022-08-29 ENCOUNTER — Telehealth: Payer: Self-pay | Admitting: Family Medicine

## 2022-08-29 ENCOUNTER — Encounter: Payer: Self-pay | Admitting: Family Medicine

## 2022-08-29 DIAGNOSIS — E538 Deficiency of other specified B group vitamins: Secondary | ICD-10-CM | POA: Diagnosis not present

## 2022-08-29 DIAGNOSIS — L409 Psoriasis, unspecified: Secondary | ICD-10-CM | POA: Diagnosis not present

## 2022-08-29 DIAGNOSIS — G43909 Migraine, unspecified, not intractable, without status migrainosus: Secondary | ICD-10-CM | POA: Diagnosis not present

## 2022-08-29 DIAGNOSIS — K760 Fatty (change of) liver, not elsewhere classified: Secondary | ICD-10-CM | POA: Diagnosis not present

## 2022-08-29 DIAGNOSIS — E559 Vitamin D deficiency, unspecified: Secondary | ICD-10-CM | POA: Diagnosis not present

## 2022-08-29 DIAGNOSIS — M797 Fibromyalgia: Secondary | ICD-10-CM | POA: Diagnosis not present

## 2022-08-29 DIAGNOSIS — I872 Venous insufficiency (chronic) (peripheral): Secondary | ICD-10-CM | POA: Diagnosis not present

## 2022-08-29 DIAGNOSIS — Z9181 History of falling: Secondary | ICD-10-CM | POA: Diagnosis not present

## 2022-08-29 DIAGNOSIS — G9529 Other cord compression: Secondary | ICD-10-CM | POA: Diagnosis not present

## 2022-08-29 DIAGNOSIS — F32A Depression, unspecified: Secondary | ICD-10-CM | POA: Diagnosis not present

## 2022-08-29 DIAGNOSIS — Z993 Dependence on wheelchair: Secondary | ICD-10-CM | POA: Diagnosis not present

## 2022-08-29 DIAGNOSIS — E785 Hyperlipidemia, unspecified: Secondary | ICD-10-CM | POA: Diagnosis not present

## 2022-08-29 DIAGNOSIS — I10 Essential (primary) hypertension: Secondary | ICD-10-CM | POA: Diagnosis not present

## 2022-08-29 DIAGNOSIS — M5136 Other intervertebral disc degeneration, lumbar region: Secondary | ICD-10-CM | POA: Diagnosis not present

## 2022-08-29 DIAGNOSIS — K219 Gastro-esophageal reflux disease without esophagitis: Secondary | ICD-10-CM | POA: Diagnosis not present

## 2022-08-29 DIAGNOSIS — M4802 Spinal stenosis, cervical region: Secondary | ICD-10-CM | POA: Diagnosis not present

## 2022-08-29 DIAGNOSIS — G4733 Obstructive sleep apnea (adult) (pediatric): Secondary | ICD-10-CM | POA: Diagnosis not present

## 2022-08-29 DIAGNOSIS — Z87440 Personal history of urinary (tract) infections: Secondary | ICD-10-CM | POA: Diagnosis not present

## 2022-08-29 DIAGNOSIS — M300 Polyarteritis nodosa: Secondary | ICD-10-CM | POA: Diagnosis not present

## 2022-08-29 DIAGNOSIS — G629 Polyneuropathy, unspecified: Secondary | ICD-10-CM | POA: Diagnosis not present

## 2022-08-29 NOTE — Telephone Encounter (Signed)
Home Health Verbal Orders  Agency:    Caller: Cecelia  Call back #: [phone] Fax #:    Requesting Skilled nursing:    Reason for Request:  SN Evaluation   Frequency:  1wk1 Red rash on flank side    HH needs F2F w/in last 30 days

## 2022-08-29 NOTE — Telephone Encounter (Signed)
Caller name: JANNAH RIAN  On DPR?: Yes  Call back number: [phone] (home)  Provider they see: Sheliah Hatch, MD  Reason for call:Pt would like to talk to [facility]

## 2022-08-29 NOTE — Telephone Encounter (Signed)
Spoke to the pt and she states she spoke to someone at [facility] and they are delivering her new hosp bed on Thursday .

## 2022-08-29 NOTE — Telephone Encounter (Signed)
Is it ok to give verbal orders for   SN Evaluation    1wk1 Red rash on flank side

## 2022-08-29 NOTE — Telephone Encounter (Signed)
Gave Cecleia at home health  verbal orders for   1wk1 Red rash on flank side  per Dr Beverely Low

## 2022-08-29 NOTE — Telephone Encounter (Signed)
Ok for verbal order  °

## 2022-08-30 ENCOUNTER — Telehealth: Payer: Self-pay | Admitting: Family Medicine

## 2022-08-30 DIAGNOSIS — G43909 Migraine, unspecified, not intractable, without status migrainosus: Secondary | ICD-10-CM | POA: Diagnosis not present

## 2022-08-30 DIAGNOSIS — E785 Hyperlipidemia, unspecified: Secondary | ICD-10-CM | POA: Diagnosis not present

## 2022-08-30 DIAGNOSIS — M4802 Spinal stenosis, cervical region: Secondary | ICD-10-CM | POA: Diagnosis not present

## 2022-08-30 DIAGNOSIS — I10 Essential (primary) hypertension: Secondary | ICD-10-CM | POA: Diagnosis not present

## 2022-08-30 DIAGNOSIS — L409 Psoriasis, unspecified: Secondary | ICD-10-CM | POA: Diagnosis not present

## 2022-08-30 DIAGNOSIS — G4733 Obstructive sleep apnea (adult) (pediatric): Secondary | ICD-10-CM | POA: Diagnosis not present

## 2022-08-30 DIAGNOSIS — M797 Fibromyalgia: Secondary | ICD-10-CM | POA: Diagnosis not present

## 2022-08-30 DIAGNOSIS — Z9181 History of falling: Secondary | ICD-10-CM | POA: Diagnosis not present

## 2022-08-30 DIAGNOSIS — G9529 Other cord compression: Secondary | ICD-10-CM | POA: Diagnosis not present

## 2022-08-30 DIAGNOSIS — M300 Polyarteritis nodosa: Secondary | ICD-10-CM | POA: Diagnosis not present

## 2022-08-30 DIAGNOSIS — E559 Vitamin D deficiency, unspecified: Secondary | ICD-10-CM | POA: Diagnosis not present

## 2022-08-30 DIAGNOSIS — M5136 Other intervertebral disc degeneration, lumbar region: Secondary | ICD-10-CM | POA: Diagnosis not present

## 2022-08-30 DIAGNOSIS — G629 Polyneuropathy, unspecified: Secondary | ICD-10-CM | POA: Diagnosis not present

## 2022-08-30 DIAGNOSIS — K760 Fatty (change of) liver, not elsewhere classified: Secondary | ICD-10-CM | POA: Diagnosis not present

## 2022-08-30 DIAGNOSIS — E538 Deficiency of other specified B group vitamins: Secondary | ICD-10-CM | POA: Diagnosis not present

## 2022-08-30 DIAGNOSIS — F32A Depression, unspecified: Secondary | ICD-10-CM | POA: Diagnosis not present

## 2022-08-30 DIAGNOSIS — Z87440 Personal history of urinary (tract) infections: Secondary | ICD-10-CM | POA: Diagnosis not present

## 2022-08-30 DIAGNOSIS — I872 Venous insufficiency (chronic) (peripheral): Secondary | ICD-10-CM | POA: Diagnosis not present

## 2022-08-30 DIAGNOSIS — Z993 Dependence on wheelchair: Secondary | ICD-10-CM | POA: Diagnosis not present

## 2022-08-30 DIAGNOSIS — K219 Gastro-esophageal reflux disease without esophagitis: Secondary | ICD-10-CM | POA: Diagnosis not present

## 2022-08-30 NOTE — Telephone Encounter (Signed)
Home Care orders [facility] - placed in bin front desk.

## 2022-08-30 NOTE — Telephone Encounter (Signed)
Forms placed in Dr [name] to be signed folder  

## 2022-08-31 ENCOUNTER — Telehealth: Payer: Self-pay | Admitting: Family Medicine

## 2022-08-31 ENCOUNTER — Other Ambulatory Visit: Payer: Self-pay

## 2022-08-31 DIAGNOSIS — G9589 Other specified diseases of spinal cord: Secondary | ICD-10-CM

## 2022-08-31 DIAGNOSIS — R29898 Other symptoms and signs involving the musculoskeletal system: Secondary | ICD-10-CM

## 2022-08-31 DIAGNOSIS — G4733 Obstructive sleep apnea (adult) (pediatric): Secondary | ICD-10-CM | POA: Diagnosis not present

## 2022-08-31 DIAGNOSIS — Z9884 Bariatric surgery status: Secondary | ICD-10-CM

## 2022-08-31 MED ORDER — DOXYCYCLINE HYCLATE 100 MG PO TABS: 100.0000 mg | ORAL_TABLET | Freq: Two times a day (BID) | ORAL | 0 refills | Status: DC

## 2022-08-31 NOTE — Telephone Encounter (Signed)
I would really feel more comfortable if this was evaluated as she has a history of sepsis from rashes like this, but I will send in Doxycycline for her to start ASAP.  They need to get vitals on her- HR, temp, O2 sats- so we can be sure she's not becoming septic.

## 2022-08-31 NOTE — Telephone Encounter (Signed)
I spoke to [name] from home health  States her vitals are good [name] will advise pt that we sent in the Rx and if pt develops a fever or if things get worse pt will need to go to ER . I did talk to Keryl myself as well and explained this

## 2022-08-31 NOTE — Addendum Note (Signed)
Addended by: Sheliah Hatch on: 08/31/2022 12:22 PM   Modules accepted: Orders

## 2022-08-31 NOTE — Telephone Encounter (Signed)
Home Health Agency: [facility]    Caller: ([name]  Call back #: [phone] Fax #:    Requesting Skilled nursing:    Reason for Request: Yeast infection R abdominal  fold spreading to R flank and buttocks cheek.\ Scratched it and warm and bright red- concern cellulitis. -advise        HH needs F2F w/in last 30 days

## 2022-08-31 NOTE — Telephone Encounter (Signed)
Home health states pt rash is spreading and she is concerned for Cellulitis

## 2022-09-01 ENCOUNTER — Telehealth: Payer: Self-pay | Admitting: Family Medicine

## 2022-09-01 NOTE — Telephone Encounter (Signed)
Forms placed in Dr [name] to be signed folder  

## 2022-09-01 NOTE — Telephone Encounter (Signed)
I have routed this to Dr Beverely Low

## 2022-09-01 NOTE — Telephone Encounter (Signed)
[facility] orders. Placed in the front bin.

## 2022-09-01 NOTE — Telephone Encounter (Signed)
[facility] Orders. Placed in bin front desk.

## 2022-09-05 ENCOUNTER — Encounter: Payer: Self-pay | Admitting: Family Medicine

## 2022-09-05 DIAGNOSIS — E559 Vitamin D deficiency, unspecified: Secondary | ICD-10-CM | POA: Diagnosis not present

## 2022-09-05 DIAGNOSIS — M5136 Other intervertebral disc degeneration, lumbar region: Secondary | ICD-10-CM | POA: Diagnosis not present

## 2022-09-05 DIAGNOSIS — G629 Polyneuropathy, unspecified: Secondary | ICD-10-CM | POA: Diagnosis not present

## 2022-09-05 DIAGNOSIS — Z87440 Personal history of urinary (tract) infections: Secondary | ICD-10-CM | POA: Diagnosis not present

## 2022-09-05 DIAGNOSIS — I10 Essential (primary) hypertension: Secondary | ICD-10-CM | POA: Diagnosis not present

## 2022-09-05 DIAGNOSIS — G9529 Other cord compression: Secondary | ICD-10-CM | POA: Diagnosis not present

## 2022-09-05 DIAGNOSIS — M4802 Spinal stenosis, cervical region: Secondary | ICD-10-CM | POA: Diagnosis not present

## 2022-09-05 DIAGNOSIS — G43909 Migraine, unspecified, not intractable, without status migrainosus: Secondary | ICD-10-CM | POA: Diagnosis not present

## 2022-09-05 DIAGNOSIS — E538 Deficiency of other specified B group vitamins: Secondary | ICD-10-CM | POA: Diagnosis not present

## 2022-09-05 DIAGNOSIS — M797 Fibromyalgia: Secondary | ICD-10-CM | POA: Diagnosis not present

## 2022-09-05 DIAGNOSIS — E785 Hyperlipidemia, unspecified: Secondary | ICD-10-CM | POA: Diagnosis not present

## 2022-09-05 DIAGNOSIS — Z9181 History of falling: Secondary | ICD-10-CM | POA: Diagnosis not present

## 2022-09-05 DIAGNOSIS — G4733 Obstructive sleep apnea (adult) (pediatric): Secondary | ICD-10-CM | POA: Diagnosis not present

## 2022-09-05 DIAGNOSIS — M300 Polyarteritis nodosa: Secondary | ICD-10-CM | POA: Diagnosis not present

## 2022-09-05 DIAGNOSIS — K760 Fatty (change of) liver, not elsewhere classified: Secondary | ICD-10-CM | POA: Diagnosis not present

## 2022-09-05 DIAGNOSIS — L409 Psoriasis, unspecified: Secondary | ICD-10-CM | POA: Diagnosis not present

## 2022-09-05 DIAGNOSIS — F32A Depression, unspecified: Secondary | ICD-10-CM | POA: Diagnosis not present

## 2022-09-05 DIAGNOSIS — Z993 Dependence on wheelchair: Secondary | ICD-10-CM | POA: Diagnosis not present

## 2022-09-05 DIAGNOSIS — I872 Venous insufficiency (chronic) (peripheral): Secondary | ICD-10-CM | POA: Diagnosis not present

## 2022-09-05 DIAGNOSIS — K219 Gastro-esophageal reflux disease without esophagitis: Secondary | ICD-10-CM | POA: Diagnosis not present

## 2022-09-05 NOTE — Telephone Encounter (Signed)
Forms signed and returned to [facility] to fax

## 2022-09-05 NOTE — Telephone Encounter (Signed)
Forms faxed and placed in scan  

## 2022-09-05 NOTE — Telephone Encounter (Signed)
Forms completed and returned to [facility] 

## 2022-09-06 ENCOUNTER — Other Ambulatory Visit: Payer: Self-pay

## 2022-09-06 ENCOUNTER — Telehealth: Payer: Self-pay | Admitting: Family Medicine

## 2022-09-06 NOTE — Telephone Encounter (Signed)
Could you please reorder bed fully electric.

## 2022-09-06 NOTE — Telephone Encounter (Signed)
Caller name: WYNNE ROZAK  On DPR?: Yes  Call back number: [phone] (home)  Provider they see: Sheliah Hatch, MD  Reason for call: New prescription for the bed to fully electric.

## 2022-09-06 NOTE — Telephone Encounter (Signed)
Ok to send new DME order for a fully electric bed

## 2022-09-06 NOTE — Telephone Encounter (Signed)
Order has been sent to [facility]

## 2022-09-07 ENCOUNTER — Encounter (HOSPITAL_BASED_OUTPATIENT_CLINIC_OR_DEPARTMENT_OTHER): Payer: Self-pay

## 2022-09-08 ENCOUNTER — Other Ambulatory Visit: Payer: Self-pay | Admitting: Family Medicine

## 2022-09-08 DIAGNOSIS — G9589 Other specified diseases of spinal cord: Secondary | ICD-10-CM

## 2022-09-08 NOTE — Telephone Encounter (Signed)
Hydrocodone 10-325 mg LOV: 08/24/22 Last Refill:08/10/22 Upcoming appt: none

## 2022-09-08 NOTE — Telephone Encounter (Signed)
Patient is requesting a refill of the following medications: Requested Prescriptions   Pending Prescriptions Disp Refills   ALPRAZolam (XANAX) 0.5 MG tablet [Pharmacy Med Name: alprazolam 0.5 mg tablet] 30 tablet 3    Sig: TAKE 1 TABLET BY MOUTH 2 TIMES DAILY    Date of patient request: 09/08/22 Last office visit: 08/19/22 Date of last refill: 05/25/22 Last refill amount: 30

## 2022-09-08 NOTE — Telephone Encounter (Signed)
Patient is requesting a refill of the following medications: Requested Prescriptions   Pending Prescriptions Disp Refills   HYDROcodone-acetaminophen (NORCO) 10-325 MG tablet [Pharmacy Med Name: hydrocodone 10 mg-acetaminophen 325 mg tablet] 90 tablet 0    Sig: TAKE 1 TABLET BY MOUTH EVERY 8 HOURS AS NEEDED    Date of patient request: 09/11/22 Last office visit: 08/19/22 Date of last refill: 08/10/22 Last refill amount: 90

## 2022-09-09 ENCOUNTER — Other Ambulatory Visit: Payer: Self-pay | Admitting: Family Medicine

## 2022-09-09 DIAGNOSIS — G629 Polyneuropathy, unspecified: Secondary | ICD-10-CM | POA: Diagnosis not present

## 2022-09-09 DIAGNOSIS — K219 Gastro-esophageal reflux disease without esophagitis: Secondary | ICD-10-CM | POA: Diagnosis not present

## 2022-09-09 DIAGNOSIS — L409 Psoriasis, unspecified: Secondary | ICD-10-CM | POA: Diagnosis not present

## 2022-09-09 DIAGNOSIS — Z87440 Personal history of urinary (tract) infections: Secondary | ICD-10-CM | POA: Diagnosis not present

## 2022-09-09 DIAGNOSIS — E559 Vitamin D deficiency, unspecified: Secondary | ICD-10-CM | POA: Diagnosis not present

## 2022-09-09 DIAGNOSIS — F32A Depression, unspecified: Secondary | ICD-10-CM | POA: Diagnosis not present

## 2022-09-09 DIAGNOSIS — M300 Polyarteritis nodosa: Secondary | ICD-10-CM | POA: Diagnosis not present

## 2022-09-09 DIAGNOSIS — E538 Deficiency of other specified B group vitamins: Secondary | ICD-10-CM | POA: Diagnosis not present

## 2022-09-09 DIAGNOSIS — G9529 Other cord compression: Secondary | ICD-10-CM | POA: Diagnosis not present

## 2022-09-09 DIAGNOSIS — M5136 Other intervertebral disc degeneration, lumbar region: Secondary | ICD-10-CM | POA: Diagnosis not present

## 2022-09-09 DIAGNOSIS — M797 Fibromyalgia: Secondary | ICD-10-CM | POA: Diagnosis not present

## 2022-09-09 DIAGNOSIS — K760 Fatty (change of) liver, not elsewhere classified: Secondary | ICD-10-CM | POA: Diagnosis not present

## 2022-09-09 DIAGNOSIS — I872 Venous insufficiency (chronic) (peripheral): Secondary | ICD-10-CM | POA: Diagnosis not present

## 2022-09-09 DIAGNOSIS — E785 Hyperlipidemia, unspecified: Secondary | ICD-10-CM | POA: Diagnosis not present

## 2022-09-09 DIAGNOSIS — G43909 Migraine, unspecified, not intractable, without status migrainosus: Secondary | ICD-10-CM | POA: Diagnosis not present

## 2022-09-09 DIAGNOSIS — I10 Essential (primary) hypertension: Secondary | ICD-10-CM | POA: Diagnosis not present

## 2022-09-09 DIAGNOSIS — Z9181 History of falling: Secondary | ICD-10-CM | POA: Diagnosis not present

## 2022-09-09 DIAGNOSIS — M4802 Spinal stenosis, cervical region: Secondary | ICD-10-CM | POA: Diagnosis not present

## 2022-09-09 DIAGNOSIS — G4733 Obstructive sleep apnea (adult) (pediatric): Secondary | ICD-10-CM | POA: Diagnosis not present

## 2022-09-09 DIAGNOSIS — Z993 Dependence on wheelchair: Secondary | ICD-10-CM | POA: Diagnosis not present

## 2022-09-09 NOTE — Telephone Encounter (Signed)
Controlled substance database (PDMP) reviewed.  Clonazepam 1 mg #28 last filled 08/18/2022, nightly dosing.  Alprazolam 0.5 mg #30 last filled 08/10/2022, twice daily dosing.  It does appear that there is 1 alprazolam refill left from the March 27th Rx.  Will need to check with pharmacy, see if refill available from prior Rx, but also appears a new prescription from Dr. Beverely Low was sent yesterday to [facility] pharmacy.  Would recommend she fill that prescription and then further questions regarding quantity can be discussed with Dr. Beverely Low once she returns.

## 2022-09-09 NOTE — Telephone Encounter (Signed)
Good afternoon Inetta Fermo,  I will send this to Dr. Beverely Low to see how she would like to handle this.  Marianela Mandrell,CMA [employer]  P:[phone]   *My chart messages may take 48-72 hours to address once received. If you need anything sooner, please call our office at [phone]*

## 2022-09-12 DIAGNOSIS — L409 Psoriasis, unspecified: Secondary | ICD-10-CM | POA: Diagnosis not present

## 2022-09-12 DIAGNOSIS — Z993 Dependence on wheelchair: Secondary | ICD-10-CM | POA: Diagnosis not present

## 2022-09-12 DIAGNOSIS — M797 Fibromyalgia: Secondary | ICD-10-CM | POA: Diagnosis not present

## 2022-09-12 DIAGNOSIS — M5136 Other intervertebral disc degeneration, lumbar region: Secondary | ICD-10-CM | POA: Diagnosis not present

## 2022-09-12 DIAGNOSIS — K760 Fatty (change of) liver, not elsewhere classified: Secondary | ICD-10-CM | POA: Diagnosis not present

## 2022-09-12 DIAGNOSIS — G4733 Obstructive sleep apnea (adult) (pediatric): Secondary | ICD-10-CM | POA: Diagnosis not present

## 2022-09-12 DIAGNOSIS — G629 Polyneuropathy, unspecified: Secondary | ICD-10-CM | POA: Diagnosis not present

## 2022-09-12 DIAGNOSIS — G9529 Other cord compression: Secondary | ICD-10-CM | POA: Diagnosis not present

## 2022-09-12 DIAGNOSIS — E538 Deficiency of other specified B group vitamins: Secondary | ICD-10-CM | POA: Diagnosis not present

## 2022-09-12 DIAGNOSIS — M300 Polyarteritis nodosa: Secondary | ICD-10-CM | POA: Diagnosis not present

## 2022-09-12 DIAGNOSIS — F32A Depression, unspecified: Secondary | ICD-10-CM | POA: Diagnosis not present

## 2022-09-12 DIAGNOSIS — I10 Essential (primary) hypertension: Secondary | ICD-10-CM | POA: Diagnosis not present

## 2022-09-12 DIAGNOSIS — E785 Hyperlipidemia, unspecified: Secondary | ICD-10-CM | POA: Diagnosis not present

## 2022-09-12 DIAGNOSIS — E559 Vitamin D deficiency, unspecified: Secondary | ICD-10-CM | POA: Diagnosis not present

## 2022-09-12 DIAGNOSIS — K219 Gastro-esophageal reflux disease without esophagitis: Secondary | ICD-10-CM | POA: Diagnosis not present

## 2022-09-12 DIAGNOSIS — G43909 Migraine, unspecified, not intractable, without status migrainosus: Secondary | ICD-10-CM | POA: Diagnosis not present

## 2022-09-12 DIAGNOSIS — Z87440 Personal history of urinary (tract) infections: Secondary | ICD-10-CM | POA: Diagnosis not present

## 2022-09-12 DIAGNOSIS — Z9181 History of falling: Secondary | ICD-10-CM | POA: Diagnosis not present

## 2022-09-12 DIAGNOSIS — I872 Venous insufficiency (chronic) (peripheral): Secondary | ICD-10-CM | POA: Diagnosis not present

## 2022-09-12 DIAGNOSIS — M4802 Spinal stenosis, cervical region: Secondary | ICD-10-CM | POA: Diagnosis not present

## 2022-09-14 DIAGNOSIS — Z87440 Personal history of urinary (tract) infections: Secondary | ICD-10-CM | POA: Diagnosis not present

## 2022-09-14 DIAGNOSIS — L409 Psoriasis, unspecified: Secondary | ICD-10-CM | POA: Diagnosis not present

## 2022-09-14 DIAGNOSIS — I872 Venous insufficiency (chronic) (peripheral): Secondary | ICD-10-CM | POA: Diagnosis not present

## 2022-09-14 DIAGNOSIS — M300 Polyarteritis nodosa: Secondary | ICD-10-CM | POA: Diagnosis not present

## 2022-09-14 DIAGNOSIS — G4733 Obstructive sleep apnea (adult) (pediatric): Secondary | ICD-10-CM | POA: Diagnosis not present

## 2022-09-14 DIAGNOSIS — F32A Depression, unspecified: Secondary | ICD-10-CM | POA: Diagnosis not present

## 2022-09-14 DIAGNOSIS — Z9181 History of falling: Secondary | ICD-10-CM | POA: Diagnosis not present

## 2022-09-14 DIAGNOSIS — G9529 Other cord compression: Secondary | ICD-10-CM | POA: Diagnosis not present

## 2022-09-14 DIAGNOSIS — E538 Deficiency of other specified B group vitamins: Secondary | ICD-10-CM | POA: Diagnosis not present

## 2022-09-14 DIAGNOSIS — I10 Essential (primary) hypertension: Secondary | ICD-10-CM | POA: Diagnosis not present

## 2022-09-14 DIAGNOSIS — E559 Vitamin D deficiency, unspecified: Secondary | ICD-10-CM | POA: Diagnosis not present

## 2022-09-14 DIAGNOSIS — M4802 Spinal stenosis, cervical region: Secondary | ICD-10-CM | POA: Diagnosis not present

## 2022-09-14 DIAGNOSIS — M5136 Other intervertebral disc degeneration, lumbar region: Secondary | ICD-10-CM | POA: Diagnosis not present

## 2022-09-14 DIAGNOSIS — M797 Fibromyalgia: Secondary | ICD-10-CM | POA: Diagnosis not present

## 2022-09-14 DIAGNOSIS — Z993 Dependence on wheelchair: Secondary | ICD-10-CM | POA: Diagnosis not present

## 2022-09-14 DIAGNOSIS — K760 Fatty (change of) liver, not elsewhere classified: Secondary | ICD-10-CM | POA: Diagnosis not present

## 2022-09-14 DIAGNOSIS — G43909 Migraine, unspecified, not intractable, without status migrainosus: Secondary | ICD-10-CM | POA: Diagnosis not present

## 2022-09-14 DIAGNOSIS — K219 Gastro-esophageal reflux disease without esophagitis: Secondary | ICD-10-CM | POA: Diagnosis not present

## 2022-09-14 DIAGNOSIS — G629 Polyneuropathy, unspecified: Secondary | ICD-10-CM | POA: Diagnosis not present

## 2022-09-14 DIAGNOSIS — E785 Hyperlipidemia, unspecified: Secondary | ICD-10-CM | POA: Diagnosis not present

## 2022-09-15 ENCOUNTER — Other Ambulatory Visit: Payer: Self-pay | Admitting: Family Medicine

## 2022-09-19 DIAGNOSIS — E559 Vitamin D deficiency, unspecified: Secondary | ICD-10-CM | POA: Diagnosis not present

## 2022-09-19 DIAGNOSIS — F32A Depression, unspecified: Secondary | ICD-10-CM | POA: Diagnosis not present

## 2022-09-19 DIAGNOSIS — G629 Polyneuropathy, unspecified: Secondary | ICD-10-CM | POA: Diagnosis not present

## 2022-09-19 DIAGNOSIS — G4733 Obstructive sleep apnea (adult) (pediatric): Secondary | ICD-10-CM | POA: Diagnosis not present

## 2022-09-19 DIAGNOSIS — M4802 Spinal stenosis, cervical region: Secondary | ICD-10-CM | POA: Diagnosis not present

## 2022-09-19 DIAGNOSIS — L409 Psoriasis, unspecified: Secondary | ICD-10-CM | POA: Diagnosis not present

## 2022-09-19 DIAGNOSIS — G43909 Migraine, unspecified, not intractable, without status migrainosus: Secondary | ICD-10-CM | POA: Diagnosis not present

## 2022-09-19 DIAGNOSIS — E538 Deficiency of other specified B group vitamins: Secondary | ICD-10-CM | POA: Diagnosis not present

## 2022-09-19 DIAGNOSIS — M5136 Other intervertebral disc degeneration, lumbar region: Secondary | ICD-10-CM | POA: Diagnosis not present

## 2022-09-19 DIAGNOSIS — Z993 Dependence on wheelchair: Secondary | ICD-10-CM | POA: Diagnosis not present

## 2022-09-19 DIAGNOSIS — I10 Essential (primary) hypertension: Secondary | ICD-10-CM | POA: Diagnosis not present

## 2022-09-19 DIAGNOSIS — G9529 Other cord compression: Secondary | ICD-10-CM | POA: Diagnosis not present

## 2022-09-19 DIAGNOSIS — Z87440 Personal history of urinary (tract) infections: Secondary | ICD-10-CM | POA: Diagnosis not present

## 2022-09-19 DIAGNOSIS — M300 Polyarteritis nodosa: Secondary | ICD-10-CM | POA: Diagnosis not present

## 2022-09-19 DIAGNOSIS — K219 Gastro-esophageal reflux disease without esophagitis: Secondary | ICD-10-CM | POA: Diagnosis not present

## 2022-09-19 DIAGNOSIS — I872 Venous insufficiency (chronic) (peripheral): Secondary | ICD-10-CM | POA: Diagnosis not present

## 2022-09-19 DIAGNOSIS — Z9181 History of falling: Secondary | ICD-10-CM | POA: Diagnosis not present

## 2022-09-19 DIAGNOSIS — E785 Hyperlipidemia, unspecified: Secondary | ICD-10-CM | POA: Diagnosis not present

## 2022-09-19 DIAGNOSIS — M797 Fibromyalgia: Secondary | ICD-10-CM | POA: Diagnosis not present

## 2022-09-19 DIAGNOSIS — K760 Fatty (change of) liver, not elsewhere classified: Secondary | ICD-10-CM | POA: Diagnosis not present

## 2022-09-20 ENCOUNTER — Telehealth: Payer: Self-pay

## 2022-09-20 ENCOUNTER — Telehealth: Payer: Self-pay | Admitting: Family Medicine

## 2022-09-20 DIAGNOSIS — M4802 Spinal stenosis, cervical region: Secondary | ICD-10-CM | POA: Diagnosis not present

## 2022-09-20 DIAGNOSIS — Z87440 Personal history of urinary (tract) infections: Secondary | ICD-10-CM | POA: Diagnosis not present

## 2022-09-20 DIAGNOSIS — G4733 Obstructive sleep apnea (adult) (pediatric): Secondary | ICD-10-CM | POA: Diagnosis not present

## 2022-09-20 DIAGNOSIS — I10 Essential (primary) hypertension: Secondary | ICD-10-CM | POA: Diagnosis not present

## 2022-09-20 DIAGNOSIS — I872 Venous insufficiency (chronic) (peripheral): Secondary | ICD-10-CM | POA: Diagnosis not present

## 2022-09-20 DIAGNOSIS — G9529 Other cord compression: Secondary | ICD-10-CM | POA: Diagnosis not present

## 2022-09-20 DIAGNOSIS — G629 Polyneuropathy, unspecified: Secondary | ICD-10-CM | POA: Diagnosis not present

## 2022-09-20 DIAGNOSIS — Z993 Dependence on wheelchair: Secondary | ICD-10-CM | POA: Diagnosis not present

## 2022-09-20 DIAGNOSIS — E559 Vitamin D deficiency, unspecified: Secondary | ICD-10-CM | POA: Diagnosis not present

## 2022-09-20 DIAGNOSIS — K219 Gastro-esophageal reflux disease without esophagitis: Secondary | ICD-10-CM | POA: Diagnosis not present

## 2022-09-20 DIAGNOSIS — L409 Psoriasis, unspecified: Secondary | ICD-10-CM | POA: Diagnosis not present

## 2022-09-20 DIAGNOSIS — E785 Hyperlipidemia, unspecified: Secondary | ICD-10-CM | POA: Diagnosis not present

## 2022-09-20 DIAGNOSIS — G43909 Migraine, unspecified, not intractable, without status migrainosus: Secondary | ICD-10-CM | POA: Diagnosis not present

## 2022-09-20 DIAGNOSIS — E538 Deficiency of other specified B group vitamins: Secondary | ICD-10-CM | POA: Diagnosis not present

## 2022-09-20 DIAGNOSIS — M797 Fibromyalgia: Secondary | ICD-10-CM | POA: Diagnosis not present

## 2022-09-20 DIAGNOSIS — M300 Polyarteritis nodosa: Secondary | ICD-10-CM | POA: Diagnosis not present

## 2022-09-20 DIAGNOSIS — M5136 Other intervertebral disc degeneration, lumbar region: Secondary | ICD-10-CM | POA: Diagnosis not present

## 2022-09-20 DIAGNOSIS — F32A Depression, unspecified: Secondary | ICD-10-CM | POA: Diagnosis not present

## 2022-09-20 DIAGNOSIS — K760 Fatty (change of) liver, not elsewhere classified: Secondary | ICD-10-CM | POA: Diagnosis not present

## 2022-09-20 DIAGNOSIS — Z9181 History of falling: Secondary | ICD-10-CM | POA: Diagnosis not present

## 2022-09-20 NOTE — Telephone Encounter (Signed)
Would you like for pt to participate in this ?

## 2022-09-20 NOTE — Telephone Encounter (Signed)
Caller name: MARYPATRICIA TOWN  On DPR?: Yes  Call back number: [phone] (mobile)  Provider they see: Sheliah Hatch, MD  Reason for call:   [facility] Custodial [facility] fax# [phone]

## 2022-09-20 NOTE — Telephone Encounter (Signed)
Pt called in stating that home health is now offering Custodial care and she is king that an Rx for this be sent to [employer] for approval so she can start this

## 2022-09-20 NOTE — Telephone Encounter (Signed)
I'm ok to write a prescription for Custodial Care but I don't know where to send it

## 2022-09-20 NOTE — Telephone Encounter (Signed)
I have the pt a VM for pt to return my call and give [place] a fax number as to where to send the Rx

## 2022-09-21 DIAGNOSIS — K219 Gastro-esophageal reflux disease without esophagitis: Secondary | ICD-10-CM | POA: Diagnosis not present

## 2022-09-21 DIAGNOSIS — M797 Fibromyalgia: Secondary | ICD-10-CM | POA: Diagnosis not present

## 2022-09-21 DIAGNOSIS — M300 Polyarteritis nodosa: Secondary | ICD-10-CM | POA: Diagnosis not present

## 2022-09-21 DIAGNOSIS — E538 Deficiency of other specified B group vitamins: Secondary | ICD-10-CM | POA: Diagnosis not present

## 2022-09-21 DIAGNOSIS — Z993 Dependence on wheelchair: Secondary | ICD-10-CM | POA: Diagnosis not present

## 2022-09-21 DIAGNOSIS — Z9181 History of falling: Secondary | ICD-10-CM | POA: Diagnosis not present

## 2022-09-21 DIAGNOSIS — Z87440 Personal history of urinary (tract) infections: Secondary | ICD-10-CM | POA: Diagnosis not present

## 2022-09-21 DIAGNOSIS — K760 Fatty (change of) liver, not elsewhere classified: Secondary | ICD-10-CM | POA: Diagnosis not present

## 2022-09-21 DIAGNOSIS — L409 Psoriasis, unspecified: Secondary | ICD-10-CM | POA: Diagnosis not present

## 2022-09-21 DIAGNOSIS — G629 Polyneuropathy, unspecified: Secondary | ICD-10-CM | POA: Diagnosis not present

## 2022-09-21 DIAGNOSIS — E785 Hyperlipidemia, unspecified: Secondary | ICD-10-CM | POA: Diagnosis not present

## 2022-09-21 DIAGNOSIS — G9529 Other cord compression: Secondary | ICD-10-CM | POA: Diagnosis not present

## 2022-09-21 DIAGNOSIS — E559 Vitamin D deficiency, unspecified: Secondary | ICD-10-CM | POA: Diagnosis not present

## 2022-09-21 DIAGNOSIS — G43909 Migraine, unspecified, not intractable, without status migrainosus: Secondary | ICD-10-CM | POA: Diagnosis not present

## 2022-09-21 DIAGNOSIS — F32A Depression, unspecified: Secondary | ICD-10-CM | POA: Diagnosis not present

## 2022-09-21 DIAGNOSIS — I872 Venous insufficiency (chronic) (peripheral): Secondary | ICD-10-CM | POA: Diagnosis not present

## 2022-09-21 DIAGNOSIS — I10 Essential (primary) hypertension: Secondary | ICD-10-CM | POA: Diagnosis not present

## 2022-09-21 DIAGNOSIS — G4733 Obstructive sleep apnea (adult) (pediatric): Secondary | ICD-10-CM | POA: Diagnosis not present

## 2022-09-21 DIAGNOSIS — M4802 Spinal stenosis, cervical region: Secondary | ICD-10-CM | POA: Diagnosis not present

## 2022-09-21 DIAGNOSIS — M5136 Other intervertebral disc degeneration, lumbar region: Secondary | ICD-10-CM | POA: Diagnosis not present

## 2022-09-21 NOTE — Telephone Encounter (Signed)
Another note was created and sent to Dr Beverely Low once Rx has been written I will fax to number provided by pt

## 2022-09-21 NOTE — Telephone Encounter (Signed)
I know have the fax number to send Rx for Custodial care if you would like to write the Rx

## 2022-09-26 ENCOUNTER — Encounter: Payer: Self-pay | Admitting: Family Medicine

## 2022-09-26 DIAGNOSIS — Z87440 Personal history of urinary (tract) infections: Secondary | ICD-10-CM | POA: Diagnosis not present

## 2022-09-26 DIAGNOSIS — E559 Vitamin D deficiency, unspecified: Secondary | ICD-10-CM | POA: Diagnosis not present

## 2022-09-26 DIAGNOSIS — K760 Fatty (change of) liver, not elsewhere classified: Secondary | ICD-10-CM | POA: Diagnosis not present

## 2022-09-26 DIAGNOSIS — L409 Psoriasis, unspecified: Secondary | ICD-10-CM | POA: Diagnosis not present

## 2022-09-26 DIAGNOSIS — F32A Depression, unspecified: Secondary | ICD-10-CM | POA: Diagnosis not present

## 2022-09-26 DIAGNOSIS — M4802 Spinal stenosis, cervical region: Secondary | ICD-10-CM | POA: Diagnosis not present

## 2022-09-26 DIAGNOSIS — E785 Hyperlipidemia, unspecified: Secondary | ICD-10-CM | POA: Diagnosis not present

## 2022-09-26 DIAGNOSIS — G43909 Migraine, unspecified, not intractable, without status migrainosus: Secondary | ICD-10-CM | POA: Diagnosis not present

## 2022-09-26 DIAGNOSIS — I10 Essential (primary) hypertension: Secondary | ICD-10-CM | POA: Diagnosis not present

## 2022-09-26 DIAGNOSIS — I872 Venous insufficiency (chronic) (peripheral): Secondary | ICD-10-CM | POA: Diagnosis not present

## 2022-09-26 DIAGNOSIS — G629 Polyneuropathy, unspecified: Secondary | ICD-10-CM | POA: Diagnosis not present

## 2022-09-26 DIAGNOSIS — Z9181 History of falling: Secondary | ICD-10-CM | POA: Diagnosis not present

## 2022-09-26 DIAGNOSIS — M5136 Other intervertebral disc degeneration, lumbar region: Secondary | ICD-10-CM | POA: Diagnosis not present

## 2022-09-26 DIAGNOSIS — M300 Polyarteritis nodosa: Secondary | ICD-10-CM | POA: Diagnosis not present

## 2022-09-26 DIAGNOSIS — G4733 Obstructive sleep apnea (adult) (pediatric): Secondary | ICD-10-CM | POA: Diagnosis not present

## 2022-09-26 DIAGNOSIS — Z993 Dependence on wheelchair: Secondary | ICD-10-CM | POA: Diagnosis not present

## 2022-09-26 DIAGNOSIS — K219 Gastro-esophageal reflux disease without esophagitis: Secondary | ICD-10-CM | POA: Diagnosis not present

## 2022-09-26 DIAGNOSIS — M797 Fibromyalgia: Secondary | ICD-10-CM | POA: Diagnosis not present

## 2022-09-26 DIAGNOSIS — E538 Deficiency of other specified B group vitamins: Secondary | ICD-10-CM | POA: Diagnosis not present

## 2022-09-26 DIAGNOSIS — G9529 Other cord compression: Secondary | ICD-10-CM | POA: Diagnosis not present

## 2022-09-27 ENCOUNTER — Telehealth: Payer: Self-pay

## 2022-09-27 DIAGNOSIS — F32A Depression, unspecified: Secondary | ICD-10-CM | POA: Diagnosis not present

## 2022-09-27 DIAGNOSIS — E785 Hyperlipidemia, unspecified: Secondary | ICD-10-CM | POA: Diagnosis not present

## 2022-09-27 DIAGNOSIS — M4802 Spinal stenosis, cervical region: Secondary | ICD-10-CM | POA: Diagnosis not present

## 2022-09-27 DIAGNOSIS — I10 Essential (primary) hypertension: Secondary | ICD-10-CM | POA: Diagnosis not present

## 2022-09-27 DIAGNOSIS — Z87440 Personal history of urinary (tract) infections: Secondary | ICD-10-CM | POA: Diagnosis not present

## 2022-09-27 DIAGNOSIS — M300 Polyarteritis nodosa: Secondary | ICD-10-CM | POA: Diagnosis not present

## 2022-09-27 DIAGNOSIS — G4733 Obstructive sleep apnea (adult) (pediatric): Secondary | ICD-10-CM | POA: Diagnosis not present

## 2022-09-27 DIAGNOSIS — E559 Vitamin D deficiency, unspecified: Secondary | ICD-10-CM | POA: Diagnosis not present

## 2022-09-27 DIAGNOSIS — Z993 Dependence on wheelchair: Secondary | ICD-10-CM | POA: Diagnosis not present

## 2022-09-27 DIAGNOSIS — I872 Venous insufficiency (chronic) (peripheral): Secondary | ICD-10-CM | POA: Diagnosis not present

## 2022-09-27 DIAGNOSIS — Z9181 History of falling: Secondary | ICD-10-CM | POA: Diagnosis not present

## 2022-09-27 DIAGNOSIS — G43909 Migraine, unspecified, not intractable, without status migrainosus: Secondary | ICD-10-CM | POA: Diagnosis not present

## 2022-09-27 DIAGNOSIS — G629 Polyneuropathy, unspecified: Secondary | ICD-10-CM | POA: Diagnosis not present

## 2022-09-27 DIAGNOSIS — K760 Fatty (change of) liver, not elsewhere classified: Secondary | ICD-10-CM | POA: Diagnosis not present

## 2022-09-27 DIAGNOSIS — M797 Fibromyalgia: Secondary | ICD-10-CM | POA: Diagnosis not present

## 2022-09-27 DIAGNOSIS — E538 Deficiency of other specified B group vitamins: Secondary | ICD-10-CM | POA: Diagnosis not present

## 2022-09-27 DIAGNOSIS — L409 Psoriasis, unspecified: Secondary | ICD-10-CM | POA: Diagnosis not present

## 2022-09-27 DIAGNOSIS — K219 Gastro-esophageal reflux disease without esophagitis: Secondary | ICD-10-CM | POA: Diagnosis not present

## 2022-09-27 DIAGNOSIS — G9529 Other cord compression: Secondary | ICD-10-CM | POA: Diagnosis not present

## 2022-09-27 DIAGNOSIS — M5136 Other intervertebral disc degeneration, lumbar region: Secondary | ICD-10-CM | POA: Diagnosis not present

## 2022-09-27 NOTE — Telephone Encounter (Signed)
I gave the papers to [place] she is calling the scooter place

## 2022-09-27 NOTE — Telephone Encounter (Signed)
I completed her last note by following their template.  All questions have been answered.  I will not continue to addend or amend notes as this seems fraudulent.  If we can determine what specifically they need, I will be happy to have [place] schedule another video visit.

## 2022-09-27 NOTE — Telephone Encounter (Signed)
Pt sent a form for the power wheelchair states we need to go by to complete notes . Looks like we need a billing code added for scooter . I spoke to [place] she said to see if you would like to do a Video visit with [place] or amend the previous note ? I will give you the print out of what [facility] sent

## 2022-09-28 ENCOUNTER — Ambulatory Visit (HOSPITAL_BASED_OUTPATIENT_CLINIC_OR_DEPARTMENT_OTHER): Payer: Medicaid Other | Admitting: Pulmonary Disease

## 2022-09-28 ENCOUNTER — Other Ambulatory Visit: Payer: Self-pay | Admitting: Family Medicine

## 2022-09-28 DIAGNOSIS — Z1231 Encounter for screening mammogram for malignant neoplasm of breast: Secondary | ICD-10-CM

## 2022-09-28 NOTE — Telephone Encounter (Signed)
I have called the scooter company, spoke to [name], and explained to them again the situation. They still continued to ask me for a height and weight to which I told them I have already discussed this over a month ago with someone and let them know we can not get that due to her not being able to stand up and that was documented in the note and the vitals. I also let them know I have went over the note with someone from the company to make sure everything was in the note that needed to be and that particular note has been addended numerous times to fit what they qualify because we get a different answer each time we ask what's needed. I was told by [name] to take off question 4 from the guideline, to which I expressed my frustration with this because it's yet again something different each time we call and this is the guideline we received and corrected the note according to it. I have written down everything [name] advised and have scheduled [name] a new video visit for this again since the other note is almost 73 months old, they need an updated one. While talking with [name], she stated that her height is 5'0 and the last weight she had is 252 she stated she would try to get another one today and call me back. According to [name] this is what the documentation should include:  Height/weight Can keep majority of note from 04/13/2023 but take out the portion about "basic scooter," and going outside. Keep that she is unable to use cane/walker due to difficulty with transfers and make sure it's noted that she can safely transfer on/off power scooter (go-go elite) Make sure the note states she is mentally and physically capable of operating the scooter Keep the UE and LE strength information Make sure the note has information regarding balance and fall hx.   I'm more than happy to look over the note with you to make sure we keep what's needed and get rid of what's not. [name] has went ahead and emailed me the other  forms that will be needed once this gets taken care of that way we can fast track this.

## 2022-09-30 ENCOUNTER — Telehealth: Payer: Self-pay | Admitting: Family Medicine

## 2022-09-30 ENCOUNTER — Encounter: Payer: Self-pay | Admitting: Family Medicine

## 2022-09-30 ENCOUNTER — Telehealth (INDEPENDENT_AMBULATORY_CARE_PROVIDER_SITE_OTHER): Payer: 59 | Admitting: Family Medicine

## 2022-09-30 ENCOUNTER — Other Ambulatory Visit: Payer: Self-pay | Admitting: Family Medicine

## 2022-09-30 DIAGNOSIS — M300 Polyarteritis nodosa: Principal | ICD-10-CM

## 2022-09-30 DIAGNOSIS — G4733 Obstructive sleep apnea (adult) (pediatric): Secondary | ICD-10-CM | POA: Diagnosis not present

## 2022-09-30 DIAGNOSIS — Z7401 Bed confinement status: Secondary | ICD-10-CM

## 2022-09-30 DIAGNOSIS — G9589 Other specified diseases of spinal cord: Secondary | ICD-10-CM | POA: Diagnosis not present

## 2022-09-30 MED ORDER — GABAPENTIN 600 MG TABLET
ORAL_TABLET | Freq: Three times a day (TID) | ORAL | 3 refills | 90 days | Status: CP
Start: 2022-09-30 — End: ?

## 2022-09-30 NOTE — Unmapped (Signed)
gabapentin (NEURONTIN) 600 MG tablet     Last Visit Date: Visit date not found  Next Visit Date: Visit date not found    Lab Results   Component Value Date    ALT 8 (L) 07/31/2020    AST 17 07/31/2020    ALBUMIN 3.5 07/31/2020    CREATININE 0.49 (L) 03/11/2021     Lab Results   Component Value Date    WBC 8.7 03/11/2021    HGB 11.6 03/11/2021    HCT 35.6 03/11/2021    PLT 555 (H) 03/11/2021     Lab Results   Component Value Date    NEUTROPCT 73.9 03/11/2021    LYMPHOPCT 11.3 03/11/2021    MONOPCT 11.0 03/11/2021    EOSPCT 3.0 03/11/2021    BASOPCT 0.8 03/11/2021

## 2022-09-30 NOTE — Telephone Encounter (Signed)
Do I need to call this case manager ?

## 2022-09-30 NOTE — Progress Notes (Signed)
Virtual Visit via Video   I connected with patient on 09/30/22 at  1:20 PM EDT by a video enabled telemedicine application and verified that I am speaking with the correct person using two identifiers.  Location patient: Home Location provider: [place] [facility] Persons participating in the virtual visit: Patient, Provider, CMA Sheryle Hail C)  I discussed the limitations of evaluation and management by telemedicine and the availability of in person appointments. The patient expressed understanding and agreed to proceed.  Subjective:   HPI:   Erica evaluation- unfortunately this is the 3rd time doing a F2F for a power Erica/wheelchair as they keep changing their requirements.  Pt is in need of a power Erica/wheelchair to improve her ability to cook her own Macias, Erica her home (dust and vacuum), Erica her to the bathroom for bathing and person hygiene.  Macias is not able to do these things w/ a cane or walker b/c her legs are not strong enough to support her body weight.  Macias cannot use a manual wheelchair b/c of her body habitus (her arms cannot reach the wheels) and even if they could, her arms are not strong enough to propel it.  Macias is mentally and physically able to operate a power Erica/wheelchair as it does not take the same physical strength that other mobility assist devices require.  Macias is currently able to transfer from bed to bedside commode so Macias would be able to transfer to the Erica.  In her current state, Macias is bed bound and not able to do anything for herself or around the house.  A power mobility device would enable her to Erica out of bed, move around the house, and Erica back to doing things for herself.  Her objective measurements were taking by [facility] and included in the separate letter  ROS:   See pertinent positives and negatives per HPI.  Patient Active Problem List   Diagnosis Date Noted   Nocturnal hypoxemia 06/21/2022   Elevated LFTs 05/06/2022    Obesity hypoventilation syndrome (HCC) 05/06/2022   Acute respiratory failure with hypoxia (HCC) 04/22/2022   Hypokalemia 04/22/2022   Leg cramps 01/12/2022   CAP (community acquired pneumonia) 08/12/2021   Fever of unknown origin 08/12/2021   Sacral pressure sore 07/06/2021   Vitamin B6 deficiency 06/08/2021   Myelomalacia of cervical cord (HCC) 06/06/2021   Compressive cervical cord myelomalacia (HCC) 06/05/2021   B12 deficiency 06/05/2021   Folate deficiency 06/05/2021   Vitamin D deficiency 06/05/2021   Leg weakness, bilateral 06/04/2021   Cervical atypia 01/06/2021   Steatosis of liver 01/06/2021   Hyponatremia 07/27/2019   Sepsis (HCC) 07/26/2019   Urinary urgency 04/04/2018   Chronic narcotic use 09/07/2016   Acute blood loss anemia 01/14/2016   Multiple gastric ulcers    PAN (polyarteritis nodosa) (HCC) 11/24/2015   GERD (gastroesophageal reflux disease) 01/08/2014   Routine general medical examination at a health care facility 04/23/2013   Bariatric surgery status 10/31/2012   S/P skin biopsy 10/31/2012   Psoriasis 01/09/2012   DDD (degenerative disc disease), lumbar 11/30/2011   Migraine headache    [name] (obstructive sleep apnea) 11/16/2010   HYPERGLYCEMIA, FASTING 10/08/2009   CONTRACTURE OF TENDON 08/17/2009   PARESTHESIA, HANDS 12/23/2008   Leg pain, right 11/05/2008   ADVERSE DRUG REACTION 04/03/2008   PERIPHERAL EDEMA 03/26/2008   Obesity, morbid, BMI 50 or higher (HCC) 01/03/2007   Cellulitis 01/03/2007   Hyperlipidemia 09/22/2006   Fibromyalgia 09/20/2006   Anxiety and depression  06/28/2006   Essential hypertension 06/28/2006    Social History   Tobacco Use   Smoking status: Never   Smokeless tobacco: Never  Substance Use Topics   Alcohol use: No    Current Outpatient Medications:    albuterol (VENTOLIN HFA) 108 (90 Base) MCG/ACT inhaler, Inhale 2 puffs into the lungs every 6 (six) hours as needed for wheezing or shortness of breath., Disp: 8  g, Rfl: 0   ALPRAZolam (XANAX) 0.5 MG tablet, TAKE 1 TABLET BY MOUTH 2 TIMES DAILY, Disp: 30 tablet, Rfl: 3   ARIPiprazole (ABILIFY) 15 MG tablet, TAKE 1 TABLET BY MOUTH EVERY DAY, Disp: 90 tablet, Rfl: 1   Armodafinil 250 MG tablet, Take 1 tablet (250 mg total) by mouth in the morning and at bedtime., Disp: 60 tablet, Rfl: 3   Biotin 10 MG TABS, Take 1 tablet (10 mg total) by mouth in the morning., Disp: 90 tablet, Rfl: 1   buPROPion (WELLBUTRIN XL) 300 MG 24 hr tablet, TAKE 1 TABLET BY MOUTH EVERY DAY, Disp: 30 tablet, Rfl: 2   calcium citrate (CALCITRATE - DOSED IN MG ELEMENTAL CALCIUM) 950 (200 Ca) MG tablet, Take 1 tablet (200 mg of elemental calcium total) by mouth daily., Disp: 30 tablet, Rfl: 3   clonazePAM (KLONOPIN) 1 MG tablet, TAKE 1 TABLET BY MOUTH AT BEDTIME, Disp: 30 tablet, Rfl: 3   cyanocobalamin (VITAMIN B12) 1000 MCG tablet, TAKE 1 TABLET BY MOUTH EVERY DAY, Disp: 30 tablet, Rfl: 2   cyclobenzaprine (FLEXERIL) 10 MG tablet, TAKE 2 TABLETS BY MOUTH 2 TIMES DAILY, Disp: 120 tablet, Rfl: 2   diclofenac Sodium (VOLTAREN) 1 % GEL, Apply 4 g topically 4 (four) times daily., Disp: 1 g, Rfl: 1   doxycycline (VIBRA-TABS) 100 MG tablet, Take 1 tablet (100 mg total) by mouth 2 (two) times daily., Disp: 20 tablet, Rfl: 0   DULoxetine (CYMBALTA) 60 MG capsule, Take 2 capsules (120 mg total) by mouth daily. (Patient taking differently: Take 60 mg by mouth 2 (two) times daily.), Disp: 240 capsule, Rfl: 2   FEROSUL 325 (65 Fe) MG tablet, TAKE 1 TABLET BY MOUTH EVERY MORNING WITH BREAKFAST, Disp: 30 tablet, Rfl: 2   folic acid (FOLVITE) 1 MG tablet, TAKE 1 TABLET BY MOUTH EVERY DAY, Disp: 30 tablet, Rfl: 2   gabapentin (NEURONTIN) 600 MG tablet, Take 1 tablet (600 mg total) by mouth 3 (three) times daily. TAKE 2 TABLETS BY MOUTH 3 TIMES DAILY (Patient taking differently: Take 600 mg by mouth 3 (three) times daily.), Disp: 270 tablet, Rfl: 0   GNP VITAMIN B-1 100 MG tablet, TAKE 1 TABLET BY MOUTH  EVERY DAY, Disp: 30 tablet, Rfl: 2   HYDROcodone-acetaminophen (NORCO) 10-325 MG tablet, TAKE 1 TABLET BY MOUTH EVERY 8 HOURS AS NEEDED, Disp: 90 tablet, Rfl: 0   nebivolol (BYSTOLIC) 10 MG tablet, TAKE 1 TABLET BY MOUTH EVERY DAY, Disp: 30 tablet, Rfl: 2   nystatin (MYCOSTATIN/NYSTOP) powder, APPLY 1 APPLICATION TOPICALLY 3 TIMES DAILY, Disp: 60 g, Rfl: 3   Oxcarbazepine (TRILEPTAL) 300 MG tablet, Take 300 mg by mouth 2 (two) times daily., Disp: , Rfl:    pantoprazole (PROTONIX) 40 MG tablet, TAKE 1 TABLET BY MOUTH 2 TIMES DAILY, Disp: 60 tablet, Rfl: 2   phentermine 37.5 MG capsule, Take 1 capsule (37.5 mg total) by mouth every morning., Disp: 90 capsule, Rfl: 0   promethazine (PHENERGAN) 25 MG tablet, TAKE 1 TABLET BY MOUTH EVERY 6 HOURS AS NEEDED FOR NAUSEA AND VOMITING,  Disp: 45 tablet, Rfl: 3   pyridOXINE (VITAMIN B6) 100 MG tablet, TAKE 1 TABLET BY MOUTH EVERY DAY, Disp: 30 tablet, Rfl: 2  Allergies  Allergen Reactions   Niacin Anaphylaxis, Shortness Of Breath and Swelling    Swelling and "problems breathing"   Sulfamethoxazole-Trimethoprim Anaphylaxis, Swelling, Rash and Other (See Comments)    Throat closed and the eyes became swollen   Aspirin Hives   Bactrim Hives   Benzoin Compound Rash   Cephalexin Hives    Tolerated ceftriaxone and cefepime 07/2019   Iohexol Hives    Pt treated with PO benedryl   Lisinopril Hives   Naproxen Hives   Pnu-Imune [Pneumococcal Polysaccharide Vaccine] Rash   Sulfonamide Derivatives Rash    Objective:   There were no vitals taken for this visit. AAOx3, NAD NCAT, EOMI No obvious CN deficits Coloring WNL Pt is able to speak clearly, coherently without shortness of breath or increased work of breathing.  Thought process is linear.  Mood is appropriate.   Assessment and Plan:   Bedbound due to cervical myelomalacia- ongoing issue for pt.  We have been attempting to Erica her a power chair/Erica for months.  Unfortunately, we requirements  seem to change each time we submit the information.  At this time, the only way to restore some quality of life to Ms Groeschel is to provide her with the ability to Erica out of bed.  Macias can only do this w/ a power chair/Erica.  Please see above for a more detailed discussion.  We will resubmit everything on her behalf in the hopes that we can finally Erica this approved.   Neena Rhymes, MD 09/30/2022

## 2022-09-30 NOTE — Telephone Encounter (Signed)
[facility]  [facility] DPR?: Yes  Call back number: [phone]  Provider they see: Sheliah Hatch, MD  Reason for call:  Assigned a nurse case manager any questions or concerns call [name].

## 2022-10-01 DIAGNOSIS — K760 Fatty (change of) liver, not elsewhere classified: Secondary | ICD-10-CM | POA: Diagnosis not present

## 2022-10-01 DIAGNOSIS — E785 Hyperlipidemia, unspecified: Secondary | ICD-10-CM | POA: Diagnosis not present

## 2022-10-01 DIAGNOSIS — M300 Polyarteritis nodosa: Secondary | ICD-10-CM | POA: Diagnosis not present

## 2022-10-01 DIAGNOSIS — Z9181 History of falling: Secondary | ICD-10-CM | POA: Diagnosis not present

## 2022-10-01 DIAGNOSIS — G9529 Other cord compression: Secondary | ICD-10-CM | POA: Diagnosis not present

## 2022-10-01 DIAGNOSIS — G629 Polyneuropathy, unspecified: Secondary | ICD-10-CM | POA: Diagnosis not present

## 2022-10-01 DIAGNOSIS — M797 Fibromyalgia: Secondary | ICD-10-CM | POA: Diagnosis not present

## 2022-10-01 DIAGNOSIS — M5136 Other intervertebral disc degeneration, lumbar region: Secondary | ICD-10-CM | POA: Diagnosis not present

## 2022-10-01 DIAGNOSIS — I10 Essential (primary) hypertension: Secondary | ICD-10-CM | POA: Diagnosis not present

## 2022-10-01 DIAGNOSIS — E559 Vitamin D deficiency, unspecified: Secondary | ICD-10-CM | POA: Diagnosis not present

## 2022-10-01 DIAGNOSIS — K219 Gastro-esophageal reflux disease without esophagitis: Secondary | ICD-10-CM | POA: Diagnosis not present

## 2022-10-01 DIAGNOSIS — G4733 Obstructive sleep apnea (adult) (pediatric): Secondary | ICD-10-CM | POA: Diagnosis not present

## 2022-10-01 DIAGNOSIS — Z993 Dependence on wheelchair: Secondary | ICD-10-CM | POA: Diagnosis not present

## 2022-10-01 DIAGNOSIS — Z87440 Personal history of urinary (tract) infections: Secondary | ICD-10-CM | POA: Diagnosis not present

## 2022-10-01 DIAGNOSIS — M4802 Spinal stenosis, cervical region: Secondary | ICD-10-CM | POA: Diagnosis not present

## 2022-10-01 DIAGNOSIS — F32A Depression, unspecified: Secondary | ICD-10-CM | POA: Diagnosis not present

## 2022-10-01 DIAGNOSIS — G43909 Migraine, unspecified, not intractable, without status migrainosus: Secondary | ICD-10-CM | POA: Diagnosis not present

## 2022-10-01 DIAGNOSIS — I872 Venous insufficiency (chronic) (peripheral): Secondary | ICD-10-CM | POA: Diagnosis not present

## 2022-10-01 DIAGNOSIS — E538 Deficiency of other specified B group vitamins: Secondary | ICD-10-CM | POA: Diagnosis not present

## 2022-10-01 DIAGNOSIS — L409 Psoriasis, unspecified: Secondary | ICD-10-CM | POA: Diagnosis not present

## 2022-10-03 ENCOUNTER — Telehealth: Payer: Self-pay | Admitting: Family Medicine

## 2022-10-03 DIAGNOSIS — M300 Polyarteritis nodosa: Secondary | ICD-10-CM | POA: Diagnosis not present

## 2022-10-03 DIAGNOSIS — M797 Fibromyalgia: Secondary | ICD-10-CM | POA: Diagnosis not present

## 2022-10-03 DIAGNOSIS — L409 Psoriasis, unspecified: Secondary | ICD-10-CM | POA: Diagnosis not present

## 2022-10-03 DIAGNOSIS — G43909 Migraine, unspecified, not intractable, without status migrainosus: Secondary | ICD-10-CM | POA: Diagnosis not present

## 2022-10-03 DIAGNOSIS — I10 Essential (primary) hypertension: Secondary | ICD-10-CM | POA: Diagnosis not present

## 2022-10-03 DIAGNOSIS — K219 Gastro-esophageal reflux disease without esophagitis: Secondary | ICD-10-CM | POA: Diagnosis not present

## 2022-10-03 DIAGNOSIS — K760 Fatty (change of) liver, not elsewhere classified: Secondary | ICD-10-CM | POA: Diagnosis not present

## 2022-10-03 DIAGNOSIS — M5136 Other intervertebral disc degeneration, lumbar region: Secondary | ICD-10-CM | POA: Diagnosis not present

## 2022-10-03 DIAGNOSIS — E538 Deficiency of other specified B group vitamins: Secondary | ICD-10-CM | POA: Diagnosis not present

## 2022-10-03 DIAGNOSIS — Z993 Dependence on wheelchair: Secondary | ICD-10-CM | POA: Diagnosis not present

## 2022-10-03 DIAGNOSIS — G9529 Other cord compression: Secondary | ICD-10-CM | POA: Diagnosis not present

## 2022-10-03 DIAGNOSIS — Z87440 Personal history of urinary (tract) infections: Secondary | ICD-10-CM | POA: Diagnosis not present

## 2022-10-03 DIAGNOSIS — E559 Vitamin D deficiency, unspecified: Secondary | ICD-10-CM | POA: Diagnosis not present

## 2022-10-03 DIAGNOSIS — G629 Polyneuropathy, unspecified: Secondary | ICD-10-CM | POA: Diagnosis not present

## 2022-10-03 DIAGNOSIS — G4733 Obstructive sleep apnea (adult) (pediatric): Secondary | ICD-10-CM | POA: Diagnosis not present

## 2022-10-03 DIAGNOSIS — M4802 Spinal stenosis, cervical region: Secondary | ICD-10-CM | POA: Diagnosis not present

## 2022-10-03 DIAGNOSIS — F32A Depression, unspecified: Secondary | ICD-10-CM | POA: Diagnosis not present

## 2022-10-03 DIAGNOSIS — E785 Hyperlipidemia, unspecified: Secondary | ICD-10-CM | POA: Diagnosis not present

## 2022-10-03 DIAGNOSIS — Z9181 History of falling: Secondary | ICD-10-CM | POA: Diagnosis not present

## 2022-10-03 DIAGNOSIS — I872 Venous insufficiency (chronic) (peripheral): Secondary | ICD-10-CM | POA: Diagnosis not present

## 2022-10-03 NOTE — Telephone Encounter (Signed)
2wk9  Gait training transfers strengthening and bed mobility training is this ok to give order ?

## 2022-10-03 NOTE — Telephone Encounter (Signed)
Ok for orders? 

## 2022-10-03 NOTE — Telephone Encounter (Signed)
Home Health Verbal Orders  Agency: [facility]   Caller: Darrel Reach  PT Call back # [phone] Fax #:    Requesting PT:    Reason for Request:    Frequency:  2wk9  Gait training transfers strengthening and bed mobility training   HH needs F2F w/in last 30 days

## 2022-10-03 NOTE — Telephone Encounter (Signed)
I spoke to [name] gave the verbal order for Frequency:  2wk9  Gait training transfers strengthening and bed mobility training

## 2022-10-05 ENCOUNTER — Other Ambulatory Visit: Payer: Self-pay | Admitting: Family Medicine

## 2022-10-05 ENCOUNTER — Telehealth: Payer: Self-pay

## 2022-10-05 ENCOUNTER — Ambulatory Visit (HOSPITAL_BASED_OUTPATIENT_CLINIC_OR_DEPARTMENT_OTHER): Payer: Medicaid Other | Admitting: Pulmonary Disease

## 2022-10-05 DIAGNOSIS — G9589 Other specified diseases of spinal cord: Secondary | ICD-10-CM

## 2022-10-05 NOTE — Telephone Encounter (Signed)
Hydrocodone 10-325 mg LOV: 09/30/22 Last Refill:09/08/22 Upcoming appt: none

## 2022-10-05 NOTE — Telephone Encounter (Signed)
[employer] sent forms to be signed forms placed in Dr Beverely Low to be signed folder

## 2022-10-07 ENCOUNTER — Encounter: Payer: Self-pay | Admitting: Family Medicine

## 2022-10-07 ENCOUNTER — Telehealth: Payer: Self-pay

## 2022-10-07 NOTE — Telephone Encounter (Signed)
[facility] forms placed in Dr Beverely Low to be signed folder

## 2022-10-10 DIAGNOSIS — Z9181 History of falling: Secondary | ICD-10-CM | POA: Diagnosis not present

## 2022-10-10 DIAGNOSIS — I10 Essential (primary) hypertension: Secondary | ICD-10-CM | POA: Diagnosis not present

## 2022-10-10 DIAGNOSIS — M300 Polyarteritis nodosa: Secondary | ICD-10-CM | POA: Diagnosis not present

## 2022-10-10 DIAGNOSIS — G4733 Obstructive sleep apnea (adult) (pediatric): Secondary | ICD-10-CM | POA: Diagnosis not present

## 2022-10-10 DIAGNOSIS — M5136 Other intervertebral disc degeneration, lumbar region: Secondary | ICD-10-CM | POA: Diagnosis not present

## 2022-10-10 DIAGNOSIS — G43909 Migraine, unspecified, not intractable, without status migrainosus: Secondary | ICD-10-CM | POA: Diagnosis not present

## 2022-10-10 DIAGNOSIS — G9529 Other cord compression: Secondary | ICD-10-CM | POA: Diagnosis not present

## 2022-10-10 DIAGNOSIS — Z87440 Personal history of urinary (tract) infections: Secondary | ICD-10-CM | POA: Diagnosis not present

## 2022-10-10 DIAGNOSIS — L409 Psoriasis, unspecified: Secondary | ICD-10-CM | POA: Diagnosis not present

## 2022-10-10 DIAGNOSIS — K219 Gastro-esophageal reflux disease without esophagitis: Secondary | ICD-10-CM | POA: Diagnosis not present

## 2022-10-10 DIAGNOSIS — E785 Hyperlipidemia, unspecified: Secondary | ICD-10-CM | POA: Diagnosis not present

## 2022-10-10 DIAGNOSIS — M797 Fibromyalgia: Secondary | ICD-10-CM | POA: Diagnosis not present

## 2022-10-10 DIAGNOSIS — E559 Vitamin D deficiency, unspecified: Secondary | ICD-10-CM | POA: Diagnosis not present

## 2022-10-10 DIAGNOSIS — I872 Venous insufficiency (chronic) (peripheral): Secondary | ICD-10-CM | POA: Diagnosis not present

## 2022-10-10 DIAGNOSIS — F32A Depression, unspecified: Secondary | ICD-10-CM | POA: Diagnosis not present

## 2022-10-10 DIAGNOSIS — G629 Polyneuropathy, unspecified: Secondary | ICD-10-CM | POA: Diagnosis not present

## 2022-10-10 DIAGNOSIS — M4802 Spinal stenosis, cervical region: Secondary | ICD-10-CM | POA: Diagnosis not present

## 2022-10-10 DIAGNOSIS — K760 Fatty (change of) liver, not elsewhere classified: Secondary | ICD-10-CM | POA: Diagnosis not present

## 2022-10-10 DIAGNOSIS — E538 Deficiency of other specified B group vitamins: Secondary | ICD-10-CM | POA: Diagnosis not present

## 2022-10-11 ENCOUNTER — Telehealth: Payer: Self-pay | Admitting: Family Medicine

## 2022-10-11 DIAGNOSIS — M5136 Other intervertebral disc degeneration, lumbar region: Secondary | ICD-10-CM | POA: Diagnosis not present

## 2022-10-11 DIAGNOSIS — L409 Psoriasis, unspecified: Secondary | ICD-10-CM | POA: Diagnosis not present

## 2022-10-11 DIAGNOSIS — K219 Gastro-esophageal reflux disease without esophagitis: Secondary | ICD-10-CM | POA: Diagnosis not present

## 2022-10-11 DIAGNOSIS — K760 Fatty (change of) liver, not elsewhere classified: Secondary | ICD-10-CM | POA: Diagnosis not present

## 2022-10-11 DIAGNOSIS — G9529 Other cord compression: Secondary | ICD-10-CM | POA: Diagnosis not present

## 2022-10-11 DIAGNOSIS — M300 Polyarteritis nodosa: Secondary | ICD-10-CM | POA: Diagnosis not present

## 2022-10-11 DIAGNOSIS — I10 Essential (primary) hypertension: Secondary | ICD-10-CM | POA: Diagnosis not present

## 2022-10-11 DIAGNOSIS — F32A Depression, unspecified: Secondary | ICD-10-CM | POA: Diagnosis not present

## 2022-10-11 DIAGNOSIS — M797 Fibromyalgia: Secondary | ICD-10-CM | POA: Diagnosis not present

## 2022-10-11 DIAGNOSIS — Z87440 Personal history of urinary (tract) infections: Secondary | ICD-10-CM | POA: Diagnosis not present

## 2022-10-11 DIAGNOSIS — E559 Vitamin D deficiency, unspecified: Secondary | ICD-10-CM | POA: Diagnosis not present

## 2022-10-11 DIAGNOSIS — E785 Hyperlipidemia, unspecified: Secondary | ICD-10-CM | POA: Diagnosis not present

## 2022-10-11 DIAGNOSIS — G4733 Obstructive sleep apnea (adult) (pediatric): Secondary | ICD-10-CM | POA: Diagnosis not present

## 2022-10-11 DIAGNOSIS — Z9181 History of falling: Secondary | ICD-10-CM | POA: Diagnosis not present

## 2022-10-11 DIAGNOSIS — I872 Venous insufficiency (chronic) (peripheral): Secondary | ICD-10-CM | POA: Diagnosis not present

## 2022-10-11 DIAGNOSIS — G43909 Migraine, unspecified, not intractable, without status migrainosus: Secondary | ICD-10-CM | POA: Diagnosis not present

## 2022-10-11 DIAGNOSIS — M4802 Spinal stenosis, cervical region: Secondary | ICD-10-CM | POA: Diagnosis not present

## 2022-10-11 DIAGNOSIS — G629 Polyneuropathy, unspecified: Secondary | ICD-10-CM | POA: Diagnosis not present

## 2022-10-11 DIAGNOSIS — E538 Deficiency of other specified B group vitamins: Secondary | ICD-10-CM | POA: Diagnosis not present

## 2022-10-11 NOTE — Telephone Encounter (Signed)
Forms faxed and placed in scan  

## 2022-10-11 NOTE — Telephone Encounter (Signed)
Forms completed and returned to [facility] 

## 2022-10-11 NOTE — Telephone Encounter (Signed)
[facility]- [employer]. Placed in front bin - No charge sheet attached.

## 2022-10-12 ENCOUNTER — Telehealth: Payer: Self-pay | Admitting: Family Medicine

## 2022-10-12 ENCOUNTER — Telehealth: Payer: Self-pay

## 2022-10-12 ENCOUNTER — Encounter: Payer: Self-pay | Admitting: Family Medicine

## 2022-10-12 DIAGNOSIS — L409 Psoriasis, unspecified: Secondary | ICD-10-CM | POA: Diagnosis not present

## 2022-10-12 DIAGNOSIS — M797 Fibromyalgia: Secondary | ICD-10-CM | POA: Diagnosis not present

## 2022-10-12 DIAGNOSIS — M5136 Other intervertebral disc degeneration, lumbar region: Secondary | ICD-10-CM | POA: Diagnosis not present

## 2022-10-12 DIAGNOSIS — F32A Depression, unspecified: Secondary | ICD-10-CM | POA: Diagnosis not present

## 2022-10-12 DIAGNOSIS — M300 Polyarteritis nodosa: Secondary | ICD-10-CM | POA: Diagnosis not present

## 2022-10-12 DIAGNOSIS — M4802 Spinal stenosis, cervical region: Secondary | ICD-10-CM | POA: Diagnosis not present

## 2022-10-12 DIAGNOSIS — E559 Vitamin D deficiency, unspecified: Secondary | ICD-10-CM | POA: Diagnosis not present

## 2022-10-12 DIAGNOSIS — Z9181 History of falling: Secondary | ICD-10-CM | POA: Diagnosis not present

## 2022-10-12 DIAGNOSIS — K219 Gastro-esophageal reflux disease without esophagitis: Secondary | ICD-10-CM | POA: Diagnosis not present

## 2022-10-12 DIAGNOSIS — Z87440 Personal history of urinary (tract) infections: Secondary | ICD-10-CM | POA: Diagnosis not present

## 2022-10-12 DIAGNOSIS — G43909 Migraine, unspecified, not intractable, without status migrainosus: Secondary | ICD-10-CM | POA: Diagnosis not present

## 2022-10-12 DIAGNOSIS — G4733 Obstructive sleep apnea (adult) (pediatric): Secondary | ICD-10-CM | POA: Diagnosis not present

## 2022-10-12 DIAGNOSIS — I872 Venous insufficiency (chronic) (peripheral): Secondary | ICD-10-CM | POA: Diagnosis not present

## 2022-10-12 DIAGNOSIS — E538 Deficiency of other specified B group vitamins: Secondary | ICD-10-CM | POA: Diagnosis not present

## 2022-10-12 DIAGNOSIS — E785 Hyperlipidemia, unspecified: Secondary | ICD-10-CM | POA: Diagnosis not present

## 2022-10-12 DIAGNOSIS — G629 Polyneuropathy, unspecified: Secondary | ICD-10-CM | POA: Diagnosis not present

## 2022-10-12 DIAGNOSIS — G9529 Other cord compression: Secondary | ICD-10-CM | POA: Diagnosis not present

## 2022-10-12 DIAGNOSIS — I10 Essential (primary) hypertension: Secondary | ICD-10-CM | POA: Diagnosis not present

## 2022-10-12 DIAGNOSIS — K760 Fatty (change of) liver, not elsewhere classified: Secondary | ICD-10-CM | POA: Diagnosis not present

## 2022-10-12 NOTE — Telephone Encounter (Signed)
[facility] sent forms to be signed placed in Dr Beverely Low to be signed folder

## 2022-10-12 NOTE — Telephone Encounter (Signed)
[employer] forms where dropped off by pt son to be signed placed in Dr Beverely Low to be signed folder

## 2022-10-12 NOTE — Telephone Encounter (Signed)
Person dropped off envelope for pt [name] for [name] to complete.

## 2022-10-13 DIAGNOSIS — M300 Polyarteritis nodosa: Secondary | ICD-10-CM | POA: Diagnosis not present

## 2022-10-13 DIAGNOSIS — F32A Depression, unspecified: Secondary | ICD-10-CM | POA: Diagnosis not present

## 2022-10-13 DIAGNOSIS — Z87440 Personal history of urinary (tract) infections: Secondary | ICD-10-CM | POA: Diagnosis not present

## 2022-10-13 DIAGNOSIS — E538 Deficiency of other specified B group vitamins: Secondary | ICD-10-CM | POA: Diagnosis not present

## 2022-10-13 DIAGNOSIS — G9529 Other cord compression: Secondary | ICD-10-CM | POA: Diagnosis not present

## 2022-10-13 DIAGNOSIS — K219 Gastro-esophageal reflux disease without esophagitis: Secondary | ICD-10-CM | POA: Diagnosis not present

## 2022-10-13 DIAGNOSIS — M5136 Other intervertebral disc degeneration, lumbar region: Secondary | ICD-10-CM | POA: Diagnosis not present

## 2022-10-13 DIAGNOSIS — G4733 Obstructive sleep apnea (adult) (pediatric): Secondary | ICD-10-CM | POA: Diagnosis not present

## 2022-10-13 DIAGNOSIS — Z9181 History of falling: Secondary | ICD-10-CM | POA: Diagnosis not present

## 2022-10-13 DIAGNOSIS — M4802 Spinal stenosis, cervical region: Secondary | ICD-10-CM | POA: Diagnosis not present

## 2022-10-13 DIAGNOSIS — G43909 Migraine, unspecified, not intractable, without status migrainosus: Secondary | ICD-10-CM | POA: Diagnosis not present

## 2022-10-13 DIAGNOSIS — G629 Polyneuropathy, unspecified: Secondary | ICD-10-CM | POA: Diagnosis not present

## 2022-10-13 DIAGNOSIS — I10 Essential (primary) hypertension: Secondary | ICD-10-CM | POA: Diagnosis not present

## 2022-10-13 DIAGNOSIS — M797 Fibromyalgia: Secondary | ICD-10-CM | POA: Diagnosis not present

## 2022-10-13 DIAGNOSIS — E785 Hyperlipidemia, unspecified: Secondary | ICD-10-CM | POA: Diagnosis not present

## 2022-10-13 DIAGNOSIS — L409 Psoriasis, unspecified: Secondary | ICD-10-CM | POA: Diagnosis not present

## 2022-10-13 DIAGNOSIS — E559 Vitamin D deficiency, unspecified: Secondary | ICD-10-CM | POA: Diagnosis not present

## 2022-10-13 DIAGNOSIS — I872 Venous insufficiency (chronic) (peripheral): Secondary | ICD-10-CM | POA: Diagnosis not present

## 2022-10-13 DIAGNOSIS — K760 Fatty (change of) liver, not elsewhere classified: Secondary | ICD-10-CM | POA: Diagnosis not present

## 2022-10-17 DIAGNOSIS — G4733 Obstructive sleep apnea (adult) (pediatric): Secondary | ICD-10-CM | POA: Diagnosis not present

## 2022-10-17 DIAGNOSIS — I10 Essential (primary) hypertension: Secondary | ICD-10-CM | POA: Diagnosis not present

## 2022-10-17 DIAGNOSIS — E538 Deficiency of other specified B group vitamins: Secondary | ICD-10-CM | POA: Diagnosis not present

## 2022-10-17 DIAGNOSIS — M300 Polyarteritis nodosa: Secondary | ICD-10-CM | POA: Diagnosis not present

## 2022-10-17 DIAGNOSIS — M5136 Other intervertebral disc degeneration, lumbar region: Secondary | ICD-10-CM | POA: Diagnosis not present

## 2022-10-17 DIAGNOSIS — L409 Psoriasis, unspecified: Secondary | ICD-10-CM | POA: Diagnosis not present

## 2022-10-17 DIAGNOSIS — G43909 Migraine, unspecified, not intractable, without status migrainosus: Secondary | ICD-10-CM | POA: Diagnosis not present

## 2022-10-17 DIAGNOSIS — F32A Depression, unspecified: Secondary | ICD-10-CM | POA: Diagnosis not present

## 2022-10-17 DIAGNOSIS — M797 Fibromyalgia: Secondary | ICD-10-CM | POA: Diagnosis not present

## 2022-10-17 DIAGNOSIS — E559 Vitamin D deficiency, unspecified: Secondary | ICD-10-CM | POA: Diagnosis not present

## 2022-10-17 DIAGNOSIS — G9529 Other cord compression: Secondary | ICD-10-CM | POA: Diagnosis not present

## 2022-10-17 DIAGNOSIS — E785 Hyperlipidemia, unspecified: Secondary | ICD-10-CM | POA: Diagnosis not present

## 2022-10-17 DIAGNOSIS — I872 Venous insufficiency (chronic) (peripheral): Secondary | ICD-10-CM | POA: Diagnosis not present

## 2022-10-17 DIAGNOSIS — Z8701 Personal history of pneumonia (recurrent): Secondary | ICD-10-CM

## 2022-10-17 DIAGNOSIS — Z87440 Personal history of urinary (tract) infections: Secondary | ICD-10-CM | POA: Diagnosis not present

## 2022-10-17 DIAGNOSIS — Z9181 History of falling: Secondary | ICD-10-CM | POA: Diagnosis not present

## 2022-10-17 DIAGNOSIS — K760 Fatty (change of) liver, not elsewhere classified: Secondary | ICD-10-CM | POA: Diagnosis not present

## 2022-10-17 DIAGNOSIS — G629 Polyneuropathy, unspecified: Secondary | ICD-10-CM | POA: Diagnosis not present

## 2022-10-17 DIAGNOSIS — F419 Anxiety disorder, unspecified: Secondary | ICD-10-CM

## 2022-10-17 DIAGNOSIS — K219 Gastro-esophageal reflux disease without esophagitis: Secondary | ICD-10-CM | POA: Diagnosis not present

## 2022-10-17 DIAGNOSIS — M4802 Spinal stenosis, cervical region: Secondary | ICD-10-CM | POA: Diagnosis not present

## 2022-10-18 DIAGNOSIS — G9529 Other cord compression: Secondary | ICD-10-CM | POA: Diagnosis not present

## 2022-10-18 DIAGNOSIS — M797 Fibromyalgia: Secondary | ICD-10-CM | POA: Diagnosis not present

## 2022-10-18 DIAGNOSIS — E785 Hyperlipidemia, unspecified: Secondary | ICD-10-CM | POA: Diagnosis not present

## 2022-10-18 DIAGNOSIS — K219 Gastro-esophageal reflux disease without esophagitis: Secondary | ICD-10-CM | POA: Diagnosis not present

## 2022-10-18 DIAGNOSIS — G43909 Migraine, unspecified, not intractable, without status migrainosus: Secondary | ICD-10-CM | POA: Diagnosis not present

## 2022-10-18 DIAGNOSIS — Z9181 History of falling: Secondary | ICD-10-CM | POA: Diagnosis not present

## 2022-10-18 DIAGNOSIS — K760 Fatty (change of) liver, not elsewhere classified: Secondary | ICD-10-CM | POA: Diagnosis not present

## 2022-10-18 DIAGNOSIS — G629 Polyneuropathy, unspecified: Secondary | ICD-10-CM | POA: Diagnosis not present

## 2022-10-18 DIAGNOSIS — E538 Deficiency of other specified B group vitamins: Secondary | ICD-10-CM | POA: Diagnosis not present

## 2022-10-18 DIAGNOSIS — F32A Depression, unspecified: Secondary | ICD-10-CM | POA: Diagnosis not present

## 2022-10-18 DIAGNOSIS — I10 Essential (primary) hypertension: Secondary | ICD-10-CM | POA: Diagnosis not present

## 2022-10-18 DIAGNOSIS — M5136 Other intervertebral disc degeneration, lumbar region: Secondary | ICD-10-CM | POA: Diagnosis not present

## 2022-10-18 DIAGNOSIS — Z87440 Personal history of urinary (tract) infections: Secondary | ICD-10-CM | POA: Diagnosis not present

## 2022-10-18 DIAGNOSIS — M4802 Spinal stenosis, cervical region: Secondary | ICD-10-CM | POA: Diagnosis not present

## 2022-10-18 DIAGNOSIS — I872 Venous insufficiency (chronic) (peripheral): Secondary | ICD-10-CM | POA: Diagnosis not present

## 2022-10-18 DIAGNOSIS — L409 Psoriasis, unspecified: Secondary | ICD-10-CM | POA: Diagnosis not present

## 2022-10-18 DIAGNOSIS — M300 Polyarteritis nodosa: Secondary | ICD-10-CM | POA: Diagnosis not present

## 2022-10-18 DIAGNOSIS — G4733 Obstructive sleep apnea (adult) (pediatric): Secondary | ICD-10-CM | POA: Diagnosis not present

## 2022-10-18 DIAGNOSIS — E559 Vitamin D deficiency, unspecified: Secondary | ICD-10-CM | POA: Diagnosis not present

## 2022-10-18 NOTE — Telephone Encounter (Signed)
Signed and returned to [place] on 6/3

## 2022-10-18 NOTE — Telephone Encounter (Signed)
Forms faxed and can not place in scan till label machine is fixed

## 2022-10-19 DIAGNOSIS — M797 Fibromyalgia: Secondary | ICD-10-CM | POA: Diagnosis not present

## 2022-10-19 DIAGNOSIS — G43909 Migraine, unspecified, not intractable, without status migrainosus: Secondary | ICD-10-CM | POA: Diagnosis not present

## 2022-10-19 DIAGNOSIS — K760 Fatty (change of) liver, not elsewhere classified: Secondary | ICD-10-CM | POA: Diagnosis not present

## 2022-10-19 DIAGNOSIS — F32A Depression, unspecified: Secondary | ICD-10-CM | POA: Diagnosis not present

## 2022-10-19 DIAGNOSIS — Z87440 Personal history of urinary (tract) infections: Secondary | ICD-10-CM | POA: Diagnosis not present

## 2022-10-19 DIAGNOSIS — E538 Deficiency of other specified B group vitamins: Secondary | ICD-10-CM | POA: Diagnosis not present

## 2022-10-19 DIAGNOSIS — I872 Venous insufficiency (chronic) (peripheral): Secondary | ICD-10-CM | POA: Diagnosis not present

## 2022-10-19 DIAGNOSIS — M5136 Other intervertebral disc degeneration, lumbar region: Secondary | ICD-10-CM | POA: Diagnosis not present

## 2022-10-19 DIAGNOSIS — Z9181 History of falling: Secondary | ICD-10-CM | POA: Diagnosis not present

## 2022-10-19 DIAGNOSIS — K219 Gastro-esophageal reflux disease without esophagitis: Secondary | ICD-10-CM | POA: Diagnosis not present

## 2022-10-19 DIAGNOSIS — G4733 Obstructive sleep apnea (adult) (pediatric): Secondary | ICD-10-CM | POA: Diagnosis not present

## 2022-10-19 DIAGNOSIS — L409 Psoriasis, unspecified: Secondary | ICD-10-CM | POA: Diagnosis not present

## 2022-10-19 DIAGNOSIS — E785 Hyperlipidemia, unspecified: Secondary | ICD-10-CM | POA: Diagnosis not present

## 2022-10-19 DIAGNOSIS — M300 Polyarteritis nodosa: Secondary | ICD-10-CM | POA: Diagnosis not present

## 2022-10-19 DIAGNOSIS — G629 Polyneuropathy, unspecified: Secondary | ICD-10-CM | POA: Diagnosis not present

## 2022-10-19 DIAGNOSIS — G9529 Other cord compression: Secondary | ICD-10-CM | POA: Diagnosis not present

## 2022-10-19 DIAGNOSIS — M4802 Spinal stenosis, cervical region: Secondary | ICD-10-CM | POA: Diagnosis not present

## 2022-10-19 DIAGNOSIS — I10 Essential (primary) hypertension: Secondary | ICD-10-CM | POA: Diagnosis not present

## 2022-10-19 DIAGNOSIS — E559 Vitamin D deficiency, unspecified: Secondary | ICD-10-CM | POA: Diagnosis not present

## 2022-10-20 MED ORDER — ALPRAZOLAM 0.5 MG PO TABS: 0.5000 mg | ORAL_TABLET | Freq: Two times a day (BID) | ORAL | 3 refills | Status: DC

## 2022-10-20 NOTE — Telephone Encounter (Signed)
Pended the Xanax with the 30 day supply   Also asking for advisement on CAPS program

## 2022-10-24 DIAGNOSIS — G9529 Other cord compression: Secondary | ICD-10-CM | POA: Diagnosis not present

## 2022-10-24 DIAGNOSIS — E785 Hyperlipidemia, unspecified: Secondary | ICD-10-CM | POA: Diagnosis not present

## 2022-10-24 DIAGNOSIS — I872 Venous insufficiency (chronic) (peripheral): Secondary | ICD-10-CM | POA: Diagnosis not present

## 2022-10-24 DIAGNOSIS — F32A Depression, unspecified: Secondary | ICD-10-CM | POA: Diagnosis not present

## 2022-10-24 DIAGNOSIS — I10 Essential (primary) hypertension: Secondary | ICD-10-CM | POA: Diagnosis not present

## 2022-10-24 DIAGNOSIS — Z87440 Personal history of urinary (tract) infections: Secondary | ICD-10-CM | POA: Diagnosis not present

## 2022-10-24 DIAGNOSIS — G43909 Migraine, unspecified, not intractable, without status migrainosus: Secondary | ICD-10-CM | POA: Diagnosis not present

## 2022-10-24 DIAGNOSIS — M5136 Other intervertebral disc degeneration, lumbar region: Secondary | ICD-10-CM | POA: Diagnosis not present

## 2022-10-24 DIAGNOSIS — M797 Fibromyalgia: Secondary | ICD-10-CM | POA: Diagnosis not present

## 2022-10-24 DIAGNOSIS — G4733 Obstructive sleep apnea (adult) (pediatric): Secondary | ICD-10-CM | POA: Diagnosis not present

## 2022-10-24 DIAGNOSIS — E559 Vitamin D deficiency, unspecified: Secondary | ICD-10-CM | POA: Diagnosis not present

## 2022-10-24 DIAGNOSIS — K760 Fatty (change of) liver, not elsewhere classified: Secondary | ICD-10-CM | POA: Diagnosis not present

## 2022-10-24 DIAGNOSIS — G629 Polyneuropathy, unspecified: Secondary | ICD-10-CM | POA: Diagnosis not present

## 2022-10-24 DIAGNOSIS — K219 Gastro-esophageal reflux disease without esophagitis: Secondary | ICD-10-CM | POA: Diagnosis not present

## 2022-10-24 DIAGNOSIS — L409 Psoriasis, unspecified: Secondary | ICD-10-CM | POA: Diagnosis not present

## 2022-10-24 DIAGNOSIS — Z9181 History of falling: Secondary | ICD-10-CM | POA: Diagnosis not present

## 2022-10-24 DIAGNOSIS — E538 Deficiency of other specified B group vitamins: Secondary | ICD-10-CM | POA: Diagnosis not present

## 2022-10-24 DIAGNOSIS — M300 Polyarteritis nodosa: Secondary | ICD-10-CM | POA: Diagnosis not present

## 2022-10-24 DIAGNOSIS — M4802 Spinal stenosis, cervical region: Secondary | ICD-10-CM | POA: Diagnosis not present

## 2022-10-25 ENCOUNTER — Other Ambulatory Visit: Payer: Self-pay | Admitting: Family Medicine

## 2022-10-25 ENCOUNTER — Telehealth: Payer: Self-pay | Admitting: Pulmonary Disease

## 2022-10-25 ENCOUNTER — Telehealth: Payer: Self-pay | Admitting: Family Medicine

## 2022-10-25 ENCOUNTER — Other Ambulatory Visit: Payer: Self-pay

## 2022-10-25 DIAGNOSIS — G9529 Other cord compression: Secondary | ICD-10-CM | POA: Diagnosis not present

## 2022-10-25 DIAGNOSIS — Z87440 Personal history of urinary (tract) infections: Secondary | ICD-10-CM | POA: Diagnosis not present

## 2022-10-25 DIAGNOSIS — M797 Fibromyalgia: Secondary | ICD-10-CM | POA: Diagnosis not present

## 2022-10-25 DIAGNOSIS — G4733 Obstructive sleep apnea (adult) (pediatric): Secondary | ICD-10-CM | POA: Diagnosis not present

## 2022-10-25 DIAGNOSIS — Z9181 History of falling: Secondary | ICD-10-CM | POA: Diagnosis not present

## 2022-10-25 DIAGNOSIS — I10 Essential (primary) hypertension: Secondary | ICD-10-CM | POA: Diagnosis not present

## 2022-10-25 DIAGNOSIS — E785 Hyperlipidemia, unspecified: Secondary | ICD-10-CM | POA: Diagnosis not present

## 2022-10-25 DIAGNOSIS — F32A Depression, unspecified: Secondary | ICD-10-CM | POA: Diagnosis not present

## 2022-10-25 DIAGNOSIS — G629 Polyneuropathy, unspecified: Secondary | ICD-10-CM | POA: Diagnosis not present

## 2022-10-25 DIAGNOSIS — K760 Fatty (change of) liver, not elsewhere classified: Secondary | ICD-10-CM | POA: Diagnosis not present

## 2022-10-25 DIAGNOSIS — M300 Polyarteritis nodosa: Secondary | ICD-10-CM | POA: Diagnosis not present

## 2022-10-25 DIAGNOSIS — G43909 Migraine, unspecified, not intractable, without status migrainosus: Secondary | ICD-10-CM | POA: Diagnosis not present

## 2022-10-25 DIAGNOSIS — L409 Psoriasis, unspecified: Secondary | ICD-10-CM | POA: Diagnosis not present

## 2022-10-25 DIAGNOSIS — I872 Venous insufficiency (chronic) (peripheral): Secondary | ICD-10-CM | POA: Diagnosis not present

## 2022-10-25 DIAGNOSIS — M5136 Other intervertebral disc degeneration, lumbar region: Secondary | ICD-10-CM | POA: Diagnosis not present

## 2022-10-25 DIAGNOSIS — E559 Vitamin D deficiency, unspecified: Secondary | ICD-10-CM | POA: Diagnosis not present

## 2022-10-25 DIAGNOSIS — M4802 Spinal stenosis, cervical region: Secondary | ICD-10-CM | POA: Diagnosis not present

## 2022-10-25 DIAGNOSIS — E538 Deficiency of other specified B group vitamins: Secondary | ICD-10-CM | POA: Diagnosis not present

## 2022-10-25 DIAGNOSIS — K219 Gastro-esophageal reflux disease without esophagitis: Secondary | ICD-10-CM | POA: Diagnosis not present

## 2022-10-25 MED ORDER — CALCIUM CITRATE 950 (200 CA) MG PO TABS: 1.0000 | ORAL_TABLET | Freq: Every day | ORAL | 3 refills | Status: DC

## 2022-10-25 NOTE — Telephone Encounter (Signed)
Patient would like the nurse to call regarding the sleep apnea machine.  Patient stated she is still trying to get use to using it and has not been able to use it that much.  Please advise and call patient to discuss further.  CB# [phone]

## 2022-10-25 NOTE — Telephone Encounter (Signed)
Pt's fecal test results are in to be reviewed folder

## 2022-10-25 NOTE — Telephone Encounter (Signed)
[employer] Fecal Test Results. Placed in front bin.

## 2022-10-26 NOTE — Telephone Encounter (Signed)
Called patient. Will discuss issues with CPAP compliance further tomorrow.

## 2022-10-27 ENCOUNTER — Encounter (HOSPITAL_BASED_OUTPATIENT_CLINIC_OR_DEPARTMENT_OTHER): Payer: Self-pay | Admitting: Pulmonary Disease

## 2022-10-27 ENCOUNTER — Ambulatory Visit (INDEPENDENT_AMBULATORY_CARE_PROVIDER_SITE_OTHER): Payer: 59 | Admitting: Pulmonary Disease

## 2022-10-27 ENCOUNTER — Other Ambulatory Visit (HOSPITAL_COMMUNITY): Payer: Self-pay

## 2022-10-27 VITALS — BP 110/80 | HR 84 | Temp 97.9°F | Ht 61.0 in | Wt 289.0 lb

## 2022-10-27 DIAGNOSIS — J4530 Mild persistent asthma, uncomplicated: Secondary | ICD-10-CM

## 2022-10-27 DIAGNOSIS — L409 Psoriasis, unspecified: Secondary | ICD-10-CM | POA: Diagnosis not present

## 2022-10-27 DIAGNOSIS — M5136 Other intervertebral disc degeneration, lumbar region: Secondary | ICD-10-CM | POA: Diagnosis not present

## 2022-10-27 DIAGNOSIS — M797 Fibromyalgia: Secondary | ICD-10-CM | POA: Diagnosis not present

## 2022-10-27 DIAGNOSIS — I872 Venous insufficiency (chronic) (peripheral): Secondary | ICD-10-CM | POA: Diagnosis not present

## 2022-10-27 DIAGNOSIS — G43909 Migraine, unspecified, not intractable, without status migrainosus: Secondary | ICD-10-CM | POA: Diagnosis not present

## 2022-10-27 DIAGNOSIS — G9529 Other cord compression: Secondary | ICD-10-CM | POA: Diagnosis not present

## 2022-10-27 DIAGNOSIS — K219 Gastro-esophageal reflux disease without esophagitis: Secondary | ICD-10-CM | POA: Diagnosis not present

## 2022-10-27 DIAGNOSIS — K760 Fatty (change of) liver, not elsewhere classified: Secondary | ICD-10-CM | POA: Diagnosis not present

## 2022-10-27 DIAGNOSIS — M300 Polyarteritis nodosa: Secondary | ICD-10-CM | POA: Diagnosis not present

## 2022-10-27 DIAGNOSIS — Z9181 History of falling: Secondary | ICD-10-CM | POA: Diagnosis not present

## 2022-10-27 DIAGNOSIS — F32A Depression, unspecified: Secondary | ICD-10-CM | POA: Diagnosis not present

## 2022-10-27 DIAGNOSIS — G4733 Obstructive sleep apnea (adult) (pediatric): Secondary | ICD-10-CM

## 2022-10-27 DIAGNOSIS — E559 Vitamin D deficiency, unspecified: Secondary | ICD-10-CM | POA: Diagnosis not present

## 2022-10-27 DIAGNOSIS — E785 Hyperlipidemia, unspecified: Secondary | ICD-10-CM | POA: Diagnosis not present

## 2022-10-27 DIAGNOSIS — E538 Deficiency of other specified B group vitamins: Secondary | ICD-10-CM | POA: Diagnosis not present

## 2022-10-27 DIAGNOSIS — I10 Essential (primary) hypertension: Secondary | ICD-10-CM | POA: Diagnosis not present

## 2022-10-27 DIAGNOSIS — G629 Polyneuropathy, unspecified: Secondary | ICD-10-CM | POA: Diagnosis not present

## 2022-10-27 DIAGNOSIS — Z87440 Personal history of urinary (tract) infections: Secondary | ICD-10-CM | POA: Diagnosis not present

## 2022-10-27 DIAGNOSIS — M4802 Spinal stenosis, cervical region: Secondary | ICD-10-CM | POA: Diagnosis not present

## 2022-10-27 MED ORDER — FLUTICASONE-SALMETEROL 100-50 MCG/ACT IN AEPB: 1.0000 | INHALATION_SPRAY | Freq: Two times a day (BID) | RESPIRATORY_TRACT | 5 refills | Status: DC

## 2022-10-27 NOTE — Unmapped (Signed)
Specialty Medication(s): [employer]    Autumn Mcdonald has been dis-enrolled from the [facility] specialty pharmacy services due to therapy completion - expected therapy completion date: 07/14/22 .    Additional information provided to the patient: Per Dr. Carlos Levering note with rheumatology, patient had infections and stopped Skyrizi. He discontinued order citing no worsening of psoriasis off of therapy.     [name]  [facility] Specialty Pharmacist

## 2022-10-27 NOTE — Progress Notes (Signed)
Subjective:   PATIENT ID: [name] GENDER: female DOB: [birth year], MRN: [id number]  Chief Complaint  Patient presents with   Follow-up    Follow up. Patient has no complaints.     Reason for Visit: Follow-up  [name] is a 52 year old female never smoker with bedbound status, morbid obesity, myelomalacia of cervical cord, [name], HTN, HLD, fibromyalgia, GERD who presents for [name]. Son present and provides additional history.  Initial consult She was previously followed by Dr. Vassie Loll at [facility] with last visit with NP Clent Ridges on 07/10/19. She was recently hospitalized in December 2023 after being found down and treated with sepsis secondary to pneumonia. Did not need to be on ventilator.  She presents for renewal of CPAP and to re-establish care. She currently sleeps in a hospital bed and is reclined. Previously did not tolerate her CPAP mask but was able to wear it during her recent hospital stay for more than 4 hours. Son reports she gasps for air at night. She is short of breath at times and is non ambulatory at baseline. Denies wheezing or coughing.  10/27/22 Each night she would start wearing her CPAP however in the middle of night she will self removal the mask without knowing. Her son tries to help and put it back on and she is too deeply asleep to notice. She is trying to be proactive and wearing it during the day in case she falls asleep during the day. Has not been able recently due to social situation at home and she has been awake all through the night. Father is now living in their home and having to monitor him due to his dementia. When she does wear her CPAP for at least 2 hours she reports benefit with quality of sleep and improved breathing.  She reports worsening shortness of breath in the last 2 months including with transitions between her walker and bed. Occasionally associated with wheezing.   Social History: Never smoker  Past Medical History:   Diagnosis Date   Allergic rhinitis    Ankle fracture, right    Anxiety    ASCUS of cervix with negative high risk HPV 05/2018   Carpal tunnel syndrome    Cervical cancer (HCC) 1998   Fibromyalgia    Hyperlipidemia    Hypertension    Migraine headache    Obesity    Sleep apnea      Family History  Problem Relation Age of Onset   Diabetes Mother    Emphysema Mother    Allergies Mother    Hypertension Mother    Heart disease Father    Allergies Father    Prostate cancer Father    Breast cancer Maternal Grandmother 48     Social History   Occupational History   Occupation: disabled. prev worked as a Scientist, physiological  Tobacco Use   Smoking status: Never   Smokeless tobacco: Never  Vaping Use   Vaping Use: Never used  Substance and Sexual Activity   Alcohol use: No   Drug use: No   Sexual activity: Yes    Comment: TAH-1st intercourse 17 yo-5 partners    Allergies  Allergen Reactions   Niacin Anaphylaxis, Shortness Of Breath and Swelling    Swelling and "problems breathing"   Sulfamethoxazole-Trimethoprim Anaphylaxis, Swelling, Rash and Other (See Comments)    Throat closed and the eyes became swollen   Aspirin Hives   Bactrim Hives   Benzoin Compound Rash   Cephalexin  Hives    Tolerated ceftriaxone and cefepime 07/2019   Iohexol Hives    Pt treated with PO benedryl   Lisinopril Hives   Naproxen Hives   Pnu-Imune [Pneumococcal Polysaccharide Vaccine] Rash   Sulfonamide Derivatives Rash     Outpatient Medications Prior to Visit  Medication Sig Dispense Refill   albuterol (VENTOLIN HFA) 108 (90 Base) MCG/ACT inhaler Inhale 2 puffs into the lungs every 6 (six) hours as needed for wheezing or shortness of breath. 8 g 0   ALPRAZolam (XANAX) 0.5 MG tablet Take 1 tablet (0.5 mg total) by mouth 2 (two) times daily. 60 tablet 3   ARIPiprazole (ABILIFY) 15 MG tablet TAKE 1 TABLET BY MOUTH EVERY DAY 90 tablet 1   Armodafinil 250 MG tablet Take 1 tablet (250 mg total) by  mouth in the morning and at bedtime. 60 tablet 3   Biotin 10 MG TABS Take 1 tablet (10 mg total) by mouth in the morning. 90 tablet 1   buPROPion (WELLBUTRIN XL) 300 MG 24 hr tablet TAKE 1 TABLET BY MOUTH EVERY DAY 30 tablet 2   calcium citrate (CALCITRATE - DOSED IN MG ELEMENTAL CALCIUM) 950 (200 Ca) MG tablet Take 1 tablet (200 mg of elemental calcium total) by mouth daily. 30 tablet 3   clonazePAM (KLONOPIN) 1 MG tablet TAKE 1 TABLET BY MOUTH AT BEDTIME 30 tablet 3   cyanocobalamin (VITAMIN B12) 1000 MCG tablet TAKE 1 TABLET BY MOUTH EVERY DAY 30 tablet 2   cyclobenzaprine (FLEXERIL) 10 MG tablet TAKE 2 TABLETS BY MOUTH 2 TIMES DAILY 120 tablet 2   diclofenac Sodium (VOLTAREN) 1 % GEL Apply 4 g topically 4 (four) times daily. 1 g 1   DULoxetine (CYMBALTA) 60 MG capsule Take 2 capsules (120 mg total) by mouth daily. (Patient taking differently: Take 60 mg by mouth 2 (two) times daily.) 240 capsule 2   FEROSUL 325 (65 Fe) MG tablet TAKE 1 TABLET BY MOUTH EVERY MORNING WITH BREAKFAST 30 tablet 2   folic acid (FOLVITE) 1 MG tablet TAKE 1 TABLET BY MOUTH EVERY DAY 30 tablet 2   gabapentin (NEURONTIN) 600 MG tablet Take 1 tablet (600 mg total) by mouth 3 (three) times daily. TAKE 2 TABLETS BY MOUTH 3 TIMES DAILY (Patient taking differently: Take 600 mg by mouth 3 (three) times daily.) 270 tablet 0   GNP VITAMIN B-1 100 MG tablet TAKE 1 TABLET BY MOUTH EVERY DAY 30 tablet 2   HYDROcodone-acetaminophen (NORCO) 10-325 MG tablet TAKE 1 TABLET BY MOUTH EVERY 8 HOURS AS NEEDED 90 tablet 0   nebivolol (BYSTOLIC) 10 MG tablet TAKE 1 TABLET BY MOUTH EVERY DAY 30 tablet 2   nystatin (MYCOSTATIN/NYSTOP) powder APPLY 1 APPLICATION TOPICALLY 3 TIMES DAILY 60 g 3   Oxcarbazepine (TRILEPTAL) 300 MG tablet Take 300 mg by mouth 2 (two) times daily.     pantoprazole (PROTONIX) 40 MG tablet TAKE 1 TABLET BY MOUTH 2 TIMES DAILY 60 tablet 2   phentermine 37.5 MG capsule Take 1 capsule (37.5 mg total) by mouth every  morning. 90 capsule 0   promethazine (PHENERGAN) 25 MG tablet TAKE 1 TABLET BY MOUTH EVERY 6 HOURS AS NEEDED FOR NAUSEA AND VOMITING 45 tablet 3   pyridOXINE (VITAMIN B6) 100 MG tablet TAKE 1 TABLET BY MOUTH EVERY DAY 30 tablet 2   doxycycline (VIBRA-TABS) 100 MG tablet Take 1 tablet (100 mg total) by mouth 2 (two) times daily. (Patient not taking: Reported on 10/27/2022) 20 tablet 0  No facility-administered medications prior to visit.    Review of Systems  Constitutional:  Negative for chills, diaphoresis, fever, malaise/fatigue and weight loss.  HENT:  Negative for congestion.   Respiratory:  Positive for shortness of breath and wheezing. Negative for cough, hemoptysis and sputum production.   Cardiovascular:  Negative for chest pain, palpitations and leg swelling.     Objective:   Vitals:   10/27/22 1019  BP: 110/80  Pulse: 84  Temp: 97.9 F (36.6 C)  TempSrc: Oral  SpO2: 91%  Weight: 289 lb (131.1 kg)  Height: 5\' 1"  (1.549 m)   SpO2: 91 % O2 Device: None (Room air)  Physical Exam: General: Morbidly obese, well-appearing, no acute distress HENT: [place], AT Eyes: EOMI, no scleral icterus Respiratory: Clear to auscultation bilaterally.  No crackles, wheezing or rales Cardiovascular: RRR, -M/R/G, no JVD Extremities:-Edema,-tenderness Neuro: AAO x4, CNII-XII grossly intact Psych: Normal mood, normal affect   Data Reviewed:  Imaging: CTA 04/21/22 - No PE. Bilateral GGO, bibasilar atelectasis. No effusion or edema.  PFT: 02/21/11 FVC 2.45 (72%) FEV1 2.21 (84%) Ratio 87  TLC 80% DLCO 51% Interpretation: No obstructive or restrictive defect. Moderately reduced DLCO  Labs: CBC    Component Value Date/Time   WBC 9.9 05/06/2022 1150   RBC 4.68 05/06/2022 1150   HGB 14.0 05/06/2022 1150   HGB 13.9 11/03/2011 1036   HCT 42.3 05/06/2022 1150   HCT 42.8 11/03/2011 1036   PLT 335 05/06/2022 1150   PLT 271 11/03/2011 1036   MCV 90.4 05/06/2022 1150   MCV 89 11/03/2011  1036   MCH 29.9 05/06/2022 1150   MCHC 33.1 05/06/2022 1150   RDW 12.9 05/06/2022 1150   RDW 14.6 11/03/2011 1036   LYMPHSABS 1,307 05/06/2022 1150   LYMPHSABS 2.8 11/03/2011 1036   MONOABS 0.5 01/12/2022 1440   EOSABS 426 05/06/2022 1150   EOSABS 0.4 11/03/2011 1036   BASOSABS 69 05/06/2022 1150   BASOSABS 0.0 11/03/2011 1036   Abso eos 05/06/22 - 426  Sleep: 01/16/19 - Slept in recliner during study. Mild [name]. AHI 7.6/h Nadir SpO2 84%. Mean SpO2 94%  CPAP compliance 09/18/22-10/17/22 Usage days 13/30 days (43%) >4 hours (0%) Avg usage 23 min    Assessment & Plan:   Discussion: 52 year old female never smoker with bedbound status, morbid obesity, myelomalacia of cervical cord, [name], HTN, HLD, fibromyalgia, GERD who presents for [name] follow-up. Initially wearing CPAP consistently have extenuating circumstances involving monitoring her father with dementia have interfered with her sleep. Discussed adherence and methods to try to keep mask on. Also discussed symptoms concerning for asthma complicated by obesity. Will trial bronchodilators for symptom relief.   History of nocturnal hypoxemia likely 2/2 [name] [name] The natural history, progression and prognosis of sleep apnea, treatment with PAP and alternative treatment strategies were discussed. The patient was also educated regarding the long term cardiovascular benefits of treating sleep apnea, including improved blood pressure control, reduction in MI and stroke risk as well as other potential benefits of treatment, such as improved glycemic control, facilitation of weight loss, improved energy during the day and improved sleep quality. --Patient  has not been able NIV for more than four hours nightly because of extreme social circumstances. The patient has been using and benefiting from PAP use and will continue to benefit from therapy. Encouraged better adherence --Counseled on sleep hygiene --Counseled on weight loss/maintenance of healthy  weight --Counseled NOT to drive if/when sleepy --Advised patient to wear CPAP for at  least 4 hours each night for greater than 70% of the time to avoid the machine being repossessed by insurance. --CONTINUE autoCPAP 5-20 cm H20 and supplies --If she continues to have difficulty tolerating full face mask, will consider mask desensitization referral vs nasal prongs + chin strap.  Asthma --START Advair Diskus 100-50 mcg ONE puff in the morning and evening. Rinse mouth out after use   Health Maintenance Immunization History  Administered Date(s) Administered   Influenza Split 03/17/2011   Influenza Whole 02/15/2010   Influenza,inj,Quad PF,6+ Mos 05/17/2015, 02/23/2016, 02/13/2017, 01/18/2019, 02/10/2021, 01/12/2022   Influenza-Unspecified 02/27/2013, 01/11/2018, 03/10/2020   PFIZER(Purple Top)SARS-COV-2 Vaccination 08/15/2019, 09/09/2019, 01/31/2020   Pfizer Covid-19 Vaccine Bivalent Booster 38yrs & up 03/11/2021   Pneumococcal Conjugate-13 10/04/2017   Pneumococcal Polysaccharide-23 03/17/2011, 01/10/2018   Td 05/16/2002   Td (Adult), 2 Lf Tetanus Toxid, Preservative Free 05/16/2002   Tdap 07/24/2018   CT Lung Screen - never smoker. Not qualified  No orders of the defined types were placed in this encounter.  Meds ordered this encounter  Medications   fluticasone-salmeterol (ADVAIR DISKUS) 100-50 MCG/ACT AEPB    Sig: Inhale 1 puff into the lungs 2 (two) times daily.    Dispense:  60 each    Refill:  5    Return in about 3 months (around 01/27/2023).  I have spent a total time of 32-minutes on the day of the appointment including chart review, data review, collecting history, coordinating care and discussing medical diagnosis and plan with the patient/family. Past medical history, allergies, medications were reviewed. Pertinent imaging, labs and tests included in this note have been reviewed and interpreted independently by me.  Jewelianna Pancoast Mechele Collin, MD [facility] 10/27/2022 10:52 AM  Office Number [phone]

## 2022-10-27 NOTE — Patient Instructions (Signed)
History of nocturnal hypoxemia likely 2/2 [name] [name] The natural history, progression and prognosis of sleep apnea, treatment with PAP and alternative treatment strategies were discussed. The patient was also educated regarding the long term cardiovascular benefits of treating sleep apnea, including improved blood pressure control, reduction in MI and stroke risk as well as other potential benefits of treatment, such as improved glycemic control, facilitation of weight loss, improved energy during the day and improved sleep quality. --Patient  has not been able NIV for more than four hours nightly because of extreme social circumstances. The patient has been using and benefiting from PAP use and will continue to benefit from therapy. Encouraged better adherence --Counseled on sleep hygiene --Counseled on weight loss/maintenance of healthy weight --Counseled NOT to drive if/when sleepy --Advised patient to wear CPAP for at least 4 hours each night for greater than 70% of the time to avoid the machine being repossessed by insurance. --CONTINUE autoCPAP 5-20 cm H20 and supplies  Asthma --START Advair Diskus 100-50 mcg ONE puff in the morning and evening. Rinse mouth out after use

## 2022-10-31 DIAGNOSIS — G43909 Migraine, unspecified, not intractable, without status migrainosus: Secondary | ICD-10-CM | POA: Diagnosis not present

## 2022-10-31 DIAGNOSIS — E559 Vitamin D deficiency, unspecified: Secondary | ICD-10-CM | POA: Diagnosis not present

## 2022-10-31 DIAGNOSIS — L409 Psoriasis, unspecified: Secondary | ICD-10-CM | POA: Diagnosis not present

## 2022-10-31 DIAGNOSIS — E785 Hyperlipidemia, unspecified: Secondary | ICD-10-CM | POA: Diagnosis not present

## 2022-10-31 DIAGNOSIS — F32A Depression, unspecified: Secondary | ICD-10-CM | POA: Diagnosis not present

## 2022-10-31 DIAGNOSIS — K760 Fatty (change of) liver, not elsewhere classified: Secondary | ICD-10-CM | POA: Diagnosis not present

## 2022-10-31 DIAGNOSIS — G9529 Other cord compression: Secondary | ICD-10-CM | POA: Diagnosis not present

## 2022-10-31 DIAGNOSIS — Z87440 Personal history of urinary (tract) infections: Secondary | ICD-10-CM | POA: Diagnosis not present

## 2022-10-31 DIAGNOSIS — M5136 Other intervertebral disc degeneration, lumbar region: Secondary | ICD-10-CM | POA: Diagnosis not present

## 2022-10-31 DIAGNOSIS — M300 Polyarteritis nodosa: Secondary | ICD-10-CM | POA: Diagnosis not present

## 2022-10-31 DIAGNOSIS — G629 Polyneuropathy, unspecified: Secondary | ICD-10-CM | POA: Diagnosis not present

## 2022-10-31 DIAGNOSIS — G4733 Obstructive sleep apnea (adult) (pediatric): Secondary | ICD-10-CM | POA: Diagnosis not present

## 2022-10-31 DIAGNOSIS — K219 Gastro-esophageal reflux disease without esophagitis: Secondary | ICD-10-CM | POA: Diagnosis not present

## 2022-10-31 DIAGNOSIS — I872 Venous insufficiency (chronic) (peripheral): Secondary | ICD-10-CM | POA: Diagnosis not present

## 2022-10-31 DIAGNOSIS — M797 Fibromyalgia: Secondary | ICD-10-CM | POA: Diagnosis not present

## 2022-10-31 DIAGNOSIS — Z9181 History of falling: Secondary | ICD-10-CM | POA: Diagnosis not present

## 2022-10-31 DIAGNOSIS — E538 Deficiency of other specified B group vitamins: Secondary | ICD-10-CM | POA: Diagnosis not present

## 2022-10-31 DIAGNOSIS — I10 Essential (primary) hypertension: Secondary | ICD-10-CM | POA: Diagnosis not present

## 2022-10-31 DIAGNOSIS — M4802 Spinal stenosis, cervical region: Secondary | ICD-10-CM | POA: Diagnosis not present

## 2022-10-31 NOTE — Telephone Encounter (Signed)
Pt is calling to check the status of the forms, they need to be turned in by 6/19.   Please fax to [phone].

## 2022-11-01 NOTE — Telephone Encounter (Signed)
Forms faxed and placed in blue folder to the left of my lap top

## 2022-11-01 NOTE — Telephone Encounter (Signed)
DR Beverely Low has these forms and will look at them today per Beverely Low

## 2022-11-01 NOTE — Telephone Encounter (Signed)
Forms faxed and in my blue folder to the left of my left top

## 2022-11-01 NOTE — Telephone Encounter (Signed)
Forms were completed- along w/ a letter- and returned to [facility] to fax

## 2022-11-02 ENCOUNTER — Ambulatory Visit (INDEPENDENT_AMBULATORY_CARE_PROVIDER_SITE_OTHER): Payer: 59 | Admitting: *Deleted

## 2022-11-02 DIAGNOSIS — Z0000 Encounter for general adult medical examination without abnormal findings: Secondary | ICD-10-CM | POA: Diagnosis not present

## 2022-11-02 NOTE — Progress Notes (Signed)
Subjective:   [name] is a 52 y.o. female who presents for Medicare Annual (Subsequent) preventive examination.  Visit Complete: Virtual  I connected with  [name] on 11/02/22 by a audio enabled telemedicine application and verified that I am speaking with the correct person using two identifiers.  Patient Location: Home  Provider Location: Home Office  I discussed the limitations of evaluation and management by telemedicine. The patient expressed understanding and agreed to proceed.  Patient Medicare AWV questionnaire was completed by the patient on ; I have confirmed that all information answered by patient is correct and no changes since this date.  Review of Systems     Cardiac Risk Factors include: advanced age (>87men, >13 women);family history of premature cardiovascular disease;hypertension;obesity (BMI >30kg/m2);sedentary lifestyle     Objective:    Today's Vitals   There is no height or weight on file to calculate BMI.     11/02/2022    3:54 PM 04/22/2022    5:00 PM 12/22/2021    1:39 PM 08/12/2021    5:06 PM 08/12/2021   11:33 AM 06/04/2021    6:05 PM 09/14/2020    1:33 PM  Advanced Directives  Does Patient Have a Medical Advance Directive? No No No No No No No  Would patient like information on creating a medical advance directive? No - Patient declined Yes (Inpatient - patient requests chaplain consult to create a medical advance directive) No - Patient declined No - Patient declined  No - Patient declined Yes (MAU/Ambulatory/Procedural Areas - Information given)    Current Medications (verified) Outpatient Encounter Medications as of 11/02/2022  Medication Sig   albuterol (VENTOLIN HFA) 108 (90 Base) MCG/ACT inhaler Inhale 2 puffs into the lungs every 6 (six) hours as needed for wheezing or shortness of breath.   ALPRAZolam (XANAX) 0.5 MG tablet Take 1 tablet (0.5 mg total) by mouth 2 (two) times daily.   ARIPiprazole (ABILIFY) 15 MG tablet TAKE 1 TABLET  BY MOUTH EVERY DAY   Armodafinil 250 MG tablet Take 1 tablet (250 mg total) by mouth in the morning and at bedtime.   Biotin 10 MG TABS Take 1 tablet (10 mg total) by mouth in the morning.   buPROPion (WELLBUTRIN XL) 300 MG 24 hr tablet TAKE 1 TABLET BY MOUTH EVERY DAY   calcium citrate (CALCITRATE - DOSED IN MG ELEMENTAL CALCIUM) 950 (200 Ca) MG tablet Take 1 tablet (200 mg of elemental calcium total) by mouth daily.   clonazePAM (KLONOPIN) 1 MG tablet TAKE 1 TABLET BY MOUTH AT BEDTIME   cyanocobalamin (VITAMIN B12) 1000 MCG tablet TAKE 1 TABLET BY MOUTH EVERY DAY   cyclobenzaprine (FLEXERIL) 10 MG tablet TAKE 2 TABLETS BY MOUTH 2 TIMES DAILY   diclofenac Sodium (VOLTAREN) 1 % GEL Apply 4 g topically 4 (four) times daily.   doxycycline (VIBRA-TABS) 100 MG tablet Take 1 tablet (100 mg total) by mouth 2 (two) times daily.   DULoxetine (CYMBALTA) 60 MG capsule Take 2 capsules (120 mg total) by mouth daily. (Patient taking differently: Take 60 mg by mouth 2 (two) times daily.)   FEROSUL 325 (65 Fe) MG tablet TAKE 1 TABLET BY MOUTH EVERY MORNING WITH BREAKFAST   fluticasone-salmeterol (ADVAIR DISKUS) 100-50 MCG/ACT AEPB Inhale 1 puff into the lungs 2 (two) times daily.   folic acid (FOLVITE) 1 MG tablet TAKE 1 TABLET BY MOUTH EVERY DAY   gabapentin (NEURONTIN) 600 MG tablet Take 1 tablet (600 mg total) by mouth 3 (  three) times daily. TAKE 2 TABLETS BY MOUTH 3 TIMES DAILY (Patient taking differently: Take 600 mg by mouth 3 (three) times daily.)   GNP VITAMIN B-1 100 MG tablet TAKE 1 TABLET BY MOUTH EVERY DAY   HYDROcodone-acetaminophen (NORCO) 10-325 MG tablet TAKE 1 TABLET BY MOUTH EVERY 8 HOURS AS NEEDED   nebivolol (BYSTOLIC) 10 MG tablet TAKE 1 TABLET BY MOUTH EVERY DAY   nystatin (MYCOSTATIN/NYSTOP) powder APPLY 1 APPLICATION TOPICALLY 3 TIMES DAILY   Oxcarbazepine (TRILEPTAL) 300 MG tablet Take 300 mg by mouth 2 (two) times daily.   pantoprazole (PROTONIX) 40 MG tablet TAKE 1 TABLET BY MOUTH 2  TIMES DAILY   phentermine 37.5 MG capsule Take 1 capsule (37.5 mg total) by mouth every morning.   promethazine (PHENERGAN) 25 MG tablet TAKE 1 TABLET BY MOUTH EVERY 6 HOURS AS NEEDED FOR NAUSEA AND VOMITING   pyridOXINE (VITAMIN B6) 100 MG tablet TAKE 1 TABLET BY MOUTH EVERY DAY   No facility-administered encounter medications on file as of 11/02/2022.    Allergies (verified) Niacin, Sulfamethoxazole-trimethoprim, Aspirin, Bactrim, Benzoin compound, Cephalexin, Iohexol, Lisinopril, Naproxen, Pnu-imune [pneumococcal polysaccharide vaccine], and Sulfonamide derivatives   History: Past Medical History:  Diagnosis Date   Allergic rhinitis    Ankle fracture, right    Anxiety    ASCUS of cervix with negative high risk HPV 05/2018   Carpal tunnel syndrome    Cervical cancer (HCC) 1998   Fibromyalgia    Hyperlipidemia    Hypertension    Migraine headache    Obesity    Sleep apnea    Past Surgical History:  Procedure Laterality Date   ABDOMINAL HYSTERECTOMY  10/1996   ABDOMINAL SURGERY  jan/11/2012   gastric bypass surgery   CARPAL TUNNEL RELEASE     bilateral    CESAREAN SECTION     CHOLECYSTECTOMY  07/1992   ESOPHAGOGASTRODUODENOSCOPY N/A 01/14/2016   Procedure: ESOPHAGOGASTRODUODENOSCOPY (EGD);  Surgeon: Napoleon Form, MD;  Location: [facility] ENDOSCOPY;  Service: Endoscopy;  Laterality: N/A;   Family History  Problem Relation Age of Onset   Diabetes Mother    Emphysema Mother    Allergies Mother    Hypertension Mother    Heart disease Father    Allergies Father    Prostate cancer Father    Breast cancer Maternal Grandmother 8   Social History   Socioeconomic History   Marital status: Single    Spouse name: Not on file   Number of children: Not on file   Years of education: Not on file   Highest education level: Not on file  Occupational History   Occupation: disabled. prev worked as a Scientist, physiological  Tobacco Use   Smoking status: Never   Smokeless tobacco: Never   Vaping Use   Vaping Use: Never used  Substance and Sexual Activity   Alcohol use: No   Drug use: No   Sexual activity: Yes    Comment: TAH-1st intercourse 17 yo-5 partners  Other Topics Concern   Not on file  Social History Narrative   Not on file   Social Determinants of Health   Financial Resource Strain: Low Risk  (11/02/2022)   Overall Financial Resource Strain (CARDIA)    Difficulty of Paying Living Expenses: Not hard at all  Food Insecurity: No Food Insecurity (11/02/2022)   Hunger Vital Sign    Worried About Running Out of Food in the Last Year: Never true    Ran Out of Food in the Last Year: Never true  Transportation Needs: No Transportation Needs (11/02/2022)   PRAPARE - Administrator, Civil Service (Medical): No    Lack of Transportation (Non-Medical): No  Physical Activity: Inactive (11/02/2022)   Exercise Vital Sign    Days of Exercise per Week: 0 days    Minutes of Exercise per Session: 0 min  Stress: Stress Concern Present (11/02/2022)   [name] of Occupational Health - Occupational Stress Questionnaire    Feeling of Stress : Rather much  Social Connections: Socially Isolated (11/02/2022)   Social Connection and Isolation Panel [NHANES]    Frequency of Communication with Friends and Family: Once a week    Frequency of Social Gatherings with Friends and Family: Once a week    Attends Religious Services: Never    Database administrator or Organizations: No    Attends Engineer, structural: Never    Marital Status: Divorced    Tobacco Counseling Counseling given: Not Answered   Clinical Intake:  Pre-visit preparation completed: Yes  Pain : No/denies pain     Nutritional Risks: None Diabetes: No  How often do you need to have someone help you when you read instructions, pamphlets, or other written materials from your doctor or pharmacy?: 1 - Never  Interpreter Needed?: No  Information entered by :: Remi Haggard  LPN   Activities of Daily Living    11/02/2022    3:38 PM 04/22/2022    5:00 PM  In your present state of health, do you have any difficulty performing the following activities:  Hearing? 0 0  Vision? 0 0  Difficulty concentrating or making decisions? 0 0  Walking or climbing stairs? 1 1  Dressing or bathing? 1 1  Doing errands, shopping? 1 1  Preparing Food and eating ? Y   Using the Toilet? Y   In the past six months, have you accidently leaked urine? Y   Do you have problems with loss of bowel control? N   Managing your Medications? Y   Managing your Finances? N   Housekeeping or managing your Housekeeping? Y     Patient Care Team: Sheliah Hatch, MD as PCP - General Tamela Oddi, PA-C as Consulting Physician (Physician Assistant) Krystal Clark, MD as Referring Physician (Rheumatology) Lennon Alstrom, MD as Referring Physician (Dermatology) Ok Edwards, MD (Inactive) (Gynecology) Fontaine, Nadyne Coombes, MD (Inactive) as Consulting Physician (Gynecology) Dahlia Byes, [facility] (Inactive) as Pharmacist (Pharmacist)  Indicate any recent Medical Services you may have received from other than [facility] providers in the past year (date may be approximate).     Assessment:   This is a routine wellness examination for Evalynne.  Hearing/Vision screen Hearing Screening - Comments:: No trouble hearing Vision Screening - Comments:: Not up to date   Dietary issues and exercise activities discussed:     Goals Addressed             This Visit's Progress    Patient Stated       Would like to be walking without walker       Depression Screen    11/02/2022    3:43 PM 09/30/2022    1:07 PM 08/19/2022    9:37 AM 07/20/2022    1:14 PM 07/05/2022    9:58 AM 06/15/2022   10:17 AM 06/01/2022    2:54 PM  PHQ 2/9 Scores  PHQ - 2 Score 3 0 0 0 0 0 0  PHQ- 9 Score 6 0 0 0 0 0  Fall Risk    11/02/2022    3:37 PM 09/30/2022    1:07 PM 08/19/2022    9:38 AM 07/20/2022    1:14 PM  07/05/2022    9:58 AM  Fall Risk   Falls in the past year? 0 0 0 0 0  Number falls in past yr: 0 0 0 0 0  Injury with Fall? 0 0 0 0 0  Risk for fall due to : Impaired balance/gait;Impaired mobility No Fall Risks No Fall Risks No Fall Risks No Fall Risks  Follow up  Falls evaluation completed Falls evaluation completed Falls evaluation completed Falls evaluation completed    MEDICARE RISK AT HOME:  Medicare Risk at Home - 11/02/22 1545     Any stairs in or around the home? Yes    If so, are there any without handrails? No    Home free of loose throw rugs in walkways, pet beds, electrical cords, etc? Yes    Adequate lighting in your home to reduce risk of falls? Yes    Life alert? No    Use of a cane, walker or w/c? Yes    Grab bars in the bathroom? Yes    Shower chair or bench in shower? Yes    Elevated toilet seat or a handicapped toilet? No             TIMED UP AND GO:  Was the test performed?  No    Cognitive Function:        11/02/2022    3:39 PM 12/22/2021    1:44 PM 09/11/2019    2:19 PM  6CIT Screen  What Year? 0 points 0 points 0 points  What month? 0 points 0 points 0 points  What time? 0 points 0 points 0 points  Count back from 20 0 points 0 points 0 points  Months in reverse 4 points 0 points 0 points  Repeat phrase 2 points 0 points 0 points  Total Score 6 points 0 points 0 points    Immunizations Immunization History  Administered Date(s) Administered   Influenza Split 03/17/2011   Influenza Whole 02/15/2010   Influenza,inj,Quad PF,6+ Mos 05/17/2015, 02/23/2016, 02/13/2017, 01/18/2019, 02/10/2021, 01/12/2022   Influenza-Unspecified 02/27/2013, 01/11/2018, 03/10/2020   PFIZER(Purple Top)SARS-COV-2 Vaccination 08/15/2019, 09/09/2019, 01/31/2020   Pfizer Covid-19 Vaccine Bivalent Booster 42yrs & up 03/11/2021   Pneumococcal Conjugate-13 10/04/2017   Pneumococcal Polysaccharide-23 03/17/2011, 01/10/2018   Td 05/16/2002   Td (Adult), 2 Lf Tetanus  Toxid, Preservative Free 05/16/2002   Tdap 07/24/2018    TDAP status: Up to date  Flu Vaccine status: Up to date  Pneumococcal vaccine status: Due, Education has been provided regarding the importance of this vaccine. Advised may receive this vaccine at local pharmacy or Health Dept. Aware to provide a copy of the vaccination record if obtained from local pharmacy or Health Dept. Verbalized acceptance and understanding.  Covid-19 vaccine status: Information provided on how to obtain vaccines.   Qualifies for Shingles Vaccine? Yes   Zostavax completed No   Shingrix Completed?: No.    Education has been provided regarding the importance of this vaccine. Patient has been advised to call insurance company to determine out of pocket expense if they have not yet received this vaccine. Advised may also receive vaccine at local pharmacy or Health Dept. Verbalized acceptance and understanding.  Screening Tests Health Maintenance  Topic Date Due   MAMMOGRAM  12/31/2022 (Originally 01/08/2021)   Colonoscopy  12/31/2022 (Originally 12/05/2014)   INFLUENZA  VACCINE  12/15/2022   Medicare Annual Wellness (AWV)  11/02/2023   DTaP/Tdap/Td (3 - Td or Tdap) 07/23/2028   Hepatitis C Screening  Completed   HIV Screening  Completed   HPV VACCINES  Aged Out   COVID-19 Vaccine  Discontinued   Zoster Vaccines- Shingrix  Discontinued    Health Maintenance  There are no preventive care reminders to display for this patient.   Colonoscopy declined  Mammogram  scheduled    Lung Cancer Screening: (Low Dose CT Chest recommended if Age 43-80 years, 20 pack-year currently smoking OR have quit w/in 15years.) does not qualify.   Lung Cancer Screening Referral:   Additional Screening:  Hepatitis C Screening: does not qualify; Completed 2023  Vision Screening: Recommended annual ophthalmology exams for early detection of glaucoma and other disorders of the eye. Is the patient up to date with their annual  eye exam? no Who is the provider or what is the name of the office in which the patient attends annual eye exams?  If pt is not established with a provider, would they like to be referred to a provider to establish care? No .   Dental Screening: Recommended annual dental exams for proper oral hygiene    Community Resource Referral / Chronic Care Management: CRR required this visit?  No   CCM required this visit?  No     Plan:     I have personally reviewed and noted the following in the patient's chart:   Medical and social history Use of alcohol, tobacco or illicit drugs  Current medications and supplements including opioid prescriptions. Patient is not currently taking opioid prescriptions. Functional ability and status Nutritional status Physical activity Advanced directives List of other physicians Hospitalizations, surgeries, and ER visits in previous 12 months Vitals Screenings to include cognitive, depression, and falls Referrals and appointments  In addition, I have reviewed and discussed with patient certain preventive protocols, quality metrics, and best practice recommendations. A written personalized care plan for preventive services as well as general preventive health recommendations were provided to patient.     Remi Haggard, LPN   1/61/0960   After Visit Summary: (MyChart) Due to this being a telephonic visit, the after visit summary with patients personalized plan was offered to patient via MyChart   Nurse Notes:

## 2022-11-02 NOTE — Patient Instructions (Signed)
[name] , Thank you for taking time to come for your Medicare Wellness Visit. I appreciate your ongoing commitment to your health goals. Please review the following plan we discussed and let me know if I can assist you in the future.   Screening recommendations/referrals: Colonoscopy: Education provided Mammogram: scheduled  Recommended yearly ophthalmology/optometry visit for glaucoma screening and checkup Recommended yearly dental visit for hygiene and checkup  Vaccinations: Influenza vaccine: Education provided Pneumococcal vaccine: Education provided Tdap vaccine: up to date Shingles vaccine: Education provided    Advanced directives: Education provided   Preventive Care 65 Years and Older, Female Preventive care refers to lifestyle choices and visits with your health care provider that can promote health and wellness. What does preventive care include? A yearly physical exam. This is also called an annual well check. Dental exams once or twice a year. Routine eye exams. Ask your health care provider how often you should have your eyes checked. Personal lifestyle choices, including: Daily care of your teeth and gums. Regular physical activity. Eating a healthy diet. Avoiding tobacco and drug use. Limiting alcohol use. Practicing safe sex. Taking low-dose aspirin every day. Taking vitamin and mineral supplements as recommended by your health care provider. What happens during an annual well check? The services and screenings done by your health care provider during your annual well check will depend on your age, overall health, lifestyle risk factors, and family history of disease. Counseling  Your health care provider may ask you questions about your: Alcohol use. Tobacco use. Drug use. Emotional well-being. Home and relationship well-being. Sexual activity. Eating habits. History of falls. Memory and ability to understand (cognition). Work and work  Astronomer. Reproductive health. Screening  You may have the following tests or measurements: Height, weight, and BMI. Blood pressure. Lipid and cholesterol levels. These may be checked every 5 years, or more frequently if you are over 2 years old. Skin check. Lung cancer screening. You may have this screening every year starting at age 8 if you have a 30-pack-year history of smoking and currently smoke or have quit within the past 15 years. Fecal occult blood test (FOBT) of the stool. You may have this test every year starting at age 25. Flexible sigmoidoscopy or colonoscopy. You may have a sigmoidoscopy every 5 years or a colonoscopy every 10 years starting at age 82. Hepatitis C blood test. Hepatitis B blood test. Sexually transmitted disease (STD) testing. Diabetes screening. This is done by checking your blood sugar (glucose) after you have not eaten for a while (fasting). You may have this done every 1-3 years. Bone density scan. This is done to screen for osteoporosis. You may have this done starting at age 5. Mammogram. This may be done every 1-2 years. Talk to your health care provider about how often you should have regular mammograms. Talk with your health care provider about your test results, treatment options, and if necessary, the need for more tests. Vaccines  Your health care provider may recommend certain vaccines, such as: Influenza vaccine. This is recommended every year. Tetanus, diphtheria, and acellular pertussis (Tdap, Td) vaccine. You may need a Td booster every 10 years. Zoster vaccine. You may need this after age 104. Pneumococcal 13-valent conjugate (PCV13) vaccine. One dose is recommended after age 45. Pneumococcal polysaccharide (PPSV23) vaccine. One dose is recommended after age 37. Talk to your health care provider about which screenings and vaccines you need and how often you need them. This information is not intended to replace  advice given to you by  your health care provider. Make sure you discuss any questions you have with your health care provider. Document Released: 05/29/2015 Document Revised: 01/20/2016 Document Reviewed: 03/03/2015 Elsevier Interactive Patient Education  2017 [employer].  Fall Prevention in the Home Falls can cause injuries. They can happen to people of all ages. There are many things you can do to make your home safe and to help prevent falls. What can I do on the outside of my home? Regularly fix the edges of walkways and driveways and fix any cracks. Remove anything that might make you trip as you walk through a door, such as a raised step or threshold. Trim any bushes or trees on the path to your home. Use bright outdoor lighting. Clear any walking paths of anything that might make someone trip, such as rocks or tools. Regularly check to see if handrails are loose or broken. Make sure that both sides of any steps have handrails. Any raised decks and porches should have guardrails on the edges. Have any leaves, snow, or ice cleared regularly. Use sand or salt on walking paths during winter. Clean up any spills in your garage right away. This includes oil or grease spills. What can I do in the bathroom? Use night lights. Install grab bars by the toilet and in the tub and shower. Do not use towel bars as grab bars. Use non-skid mats or decals in the tub or shower. If you need to sit down in the shower, use a plastic, non-slip stool. Keep the floor dry. Clean up any water that spills on the floor as soon as it happens. Remove soap buildup in the tub or shower regularly. Attach bath mats securely with double-sided non-slip rug tape. Do not have throw rugs and other things on the floor that can make you trip. What can I do in the bedroom? Use night lights. Make sure that you have a light by your bed that is easy to reach. Do not use any sheets or blankets that are too big for your bed. They should not hang  down onto the floor. Have a firm chair that has side arms. You can use this for support while you get dressed. Do not have throw rugs and other things on the floor that can make you trip. What can I do in the kitchen? Clean up any spills right away. Avoid walking on wet floors. Keep items that you use a lot in easy-to-reach places. If you need to reach something above you, use a strong step stool that has a grab bar. Keep electrical cords out of the way. Do not use floor polish or wax that makes floors slippery. If you must use wax, use non-skid floor wax. Do not have throw rugs and other things on the floor that can make you trip. What can I do with my stairs? Do not leave any items on the stairs. Make sure that there are handrails on both sides of the stairs and use them. Fix handrails that are broken or loose. Make sure that handrails are as long as the stairways. Check any carpeting to make sure that it is firmly attached to the stairs. Fix any carpet that is loose or worn. Avoid having throw rugs at the top or bottom of the stairs. If you do have throw rugs, attach them to the floor with carpet tape. Make sure that you have a light switch at the top of the stairs and the bottom  of the stairs. If you do not have them, ask someone to add them for you. What else can I do to help prevent falls? Wear shoes that: Do not have high heels. Have rubber bottoms. Are comfortable and fit you well. Are closed at the toe. Do not wear sandals. If you use a stepladder: Make sure that it is fully opened. Do not climb a closed stepladder. Make sure that both sides of the stepladder are locked into place. Ask someone to hold it for you, if possible. Clearly mark and make sure that you can see: Any grab bars or handrails. First and last steps. Where the edge of each step is. Use tools that help you move around (mobility aids) if they are needed. These  include: Canes. Walkers. Scooters. Crutches. Turn on the lights when you go into a dark area. Replace any light bulbs as soon as they burn out. Set up your furniture so you have a clear path. Avoid moving your furniture around. If any of your floors are uneven, fix them. If there are any pets around you, be aware of where they are. Review your medicines with your doctor. Some medicines can make you feel dizzy. This can increase your chance of falling. Ask your doctor what other things that you can do to help prevent falls. This information is not intended to replace advice given to you by your health care provider. Make sure you discuss any questions you have with your health care provider. Document Released: 02/26/2009 Document Revised: 10/08/2015 Document Reviewed: 06/06/2014 Elsevier Interactive Patient Education  2017 [employer].

## 2022-11-03 ENCOUNTER — Other Ambulatory Visit: Payer: Self-pay | Admitting: Family Medicine

## 2022-11-03 DIAGNOSIS — G9589 Other specified diseases of spinal cord: Secondary | ICD-10-CM

## 2022-11-03 NOTE — Telephone Encounter (Signed)
Hydrocodone 10-325 mg LOV: 09/30/22 Last Refill:10/05/22 Upcoming appt: none

## 2022-11-04 DIAGNOSIS — G9529 Other cord compression: Secondary | ICD-10-CM | POA: Diagnosis not present

## 2022-11-04 DIAGNOSIS — K219 Gastro-esophageal reflux disease without esophagitis: Secondary | ICD-10-CM | POA: Diagnosis not present

## 2022-11-04 DIAGNOSIS — M4802 Spinal stenosis, cervical region: Secondary | ICD-10-CM | POA: Diagnosis not present

## 2022-11-04 DIAGNOSIS — M797 Fibromyalgia: Secondary | ICD-10-CM | POA: Diagnosis not present

## 2022-11-04 DIAGNOSIS — E559 Vitamin D deficiency, unspecified: Secondary | ICD-10-CM | POA: Diagnosis not present

## 2022-11-04 DIAGNOSIS — E538 Deficiency of other specified B group vitamins: Secondary | ICD-10-CM | POA: Diagnosis not present

## 2022-11-04 DIAGNOSIS — L409 Psoriasis, unspecified: Secondary | ICD-10-CM | POA: Diagnosis not present

## 2022-11-04 DIAGNOSIS — G43909 Migraine, unspecified, not intractable, without status migrainosus: Secondary | ICD-10-CM | POA: Diagnosis not present

## 2022-11-04 DIAGNOSIS — G629 Polyneuropathy, unspecified: Secondary | ICD-10-CM | POA: Diagnosis not present

## 2022-11-04 DIAGNOSIS — Z87440 Personal history of urinary (tract) infections: Secondary | ICD-10-CM | POA: Diagnosis not present

## 2022-11-04 DIAGNOSIS — G4733 Obstructive sleep apnea (adult) (pediatric): Secondary | ICD-10-CM | POA: Diagnosis not present

## 2022-11-04 DIAGNOSIS — M5136 Other intervertebral disc degeneration, lumbar region: Secondary | ICD-10-CM | POA: Diagnosis not present

## 2022-11-04 DIAGNOSIS — M300 Polyarteritis nodosa: Secondary | ICD-10-CM | POA: Diagnosis not present

## 2022-11-04 DIAGNOSIS — F32A Depression, unspecified: Secondary | ICD-10-CM | POA: Diagnosis not present

## 2022-11-04 DIAGNOSIS — K760 Fatty (change of) liver, not elsewhere classified: Secondary | ICD-10-CM | POA: Diagnosis not present

## 2022-11-04 DIAGNOSIS — E785 Hyperlipidemia, unspecified: Secondary | ICD-10-CM | POA: Diagnosis not present

## 2022-11-04 DIAGNOSIS — I10 Essential (primary) hypertension: Secondary | ICD-10-CM | POA: Diagnosis not present

## 2022-11-04 DIAGNOSIS — I872 Venous insufficiency (chronic) (peripheral): Secondary | ICD-10-CM | POA: Diagnosis not present

## 2022-11-04 DIAGNOSIS — Z9181 History of falling: Secondary | ICD-10-CM | POA: Diagnosis not present

## 2022-11-04 NOTE — Telephone Encounter (Signed)
Pt aware of refill.

## 2022-11-07 DIAGNOSIS — G629 Polyneuropathy, unspecified: Secondary | ICD-10-CM | POA: Diagnosis not present

## 2022-11-07 DIAGNOSIS — Z9181 History of falling: Secondary | ICD-10-CM | POA: Diagnosis not present

## 2022-11-07 DIAGNOSIS — G4733 Obstructive sleep apnea (adult) (pediatric): Secondary | ICD-10-CM | POA: Diagnosis not present

## 2022-11-07 DIAGNOSIS — K760 Fatty (change of) liver, not elsewhere classified: Secondary | ICD-10-CM | POA: Diagnosis not present

## 2022-11-07 DIAGNOSIS — E559 Vitamin D deficiency, unspecified: Secondary | ICD-10-CM | POA: Diagnosis not present

## 2022-11-07 DIAGNOSIS — L409 Psoriasis, unspecified: Secondary | ICD-10-CM | POA: Diagnosis not present

## 2022-11-07 DIAGNOSIS — M5136 Other intervertebral disc degeneration, lumbar region: Secondary | ICD-10-CM | POA: Diagnosis not present

## 2022-11-07 DIAGNOSIS — I872 Venous insufficiency (chronic) (peripheral): Secondary | ICD-10-CM | POA: Diagnosis not present

## 2022-11-07 DIAGNOSIS — Z87440 Personal history of urinary (tract) infections: Secondary | ICD-10-CM | POA: Diagnosis not present

## 2022-11-07 DIAGNOSIS — M797 Fibromyalgia: Secondary | ICD-10-CM | POA: Diagnosis not present

## 2022-11-07 DIAGNOSIS — M300 Polyarteritis nodosa: Secondary | ICD-10-CM | POA: Diagnosis not present

## 2022-11-07 DIAGNOSIS — I10 Essential (primary) hypertension: Secondary | ICD-10-CM | POA: Diagnosis not present

## 2022-11-07 DIAGNOSIS — M4802 Spinal stenosis, cervical region: Secondary | ICD-10-CM | POA: Diagnosis not present

## 2022-11-07 DIAGNOSIS — F32A Depression, unspecified: Secondary | ICD-10-CM | POA: Diagnosis not present

## 2022-11-07 DIAGNOSIS — E785 Hyperlipidemia, unspecified: Secondary | ICD-10-CM | POA: Diagnosis not present

## 2022-11-07 DIAGNOSIS — K219 Gastro-esophageal reflux disease without esophagitis: Secondary | ICD-10-CM | POA: Diagnosis not present

## 2022-11-07 DIAGNOSIS — G9529 Other cord compression: Secondary | ICD-10-CM | POA: Diagnosis not present

## 2022-11-07 DIAGNOSIS — G43909 Migraine, unspecified, not intractable, without status migrainosus: Secondary | ICD-10-CM | POA: Diagnosis not present

## 2022-11-07 DIAGNOSIS — E538 Deficiency of other specified B group vitamins: Secondary | ICD-10-CM | POA: Diagnosis not present

## 2022-11-09 ENCOUNTER — Encounter: Payer: Self-pay | Admitting: Family Medicine

## 2022-11-09 ENCOUNTER — Telehealth (INDEPENDENT_AMBULATORY_CARE_PROVIDER_SITE_OTHER): Payer: 59 | Admitting: Family Medicine

## 2022-11-09 ENCOUNTER — Telehealth: Payer: Self-pay

## 2022-11-09 ENCOUNTER — Other Ambulatory Visit: Payer: Self-pay

## 2022-11-09 DIAGNOSIS — G9529 Other cord compression: Secondary | ICD-10-CM | POA: Diagnosis not present

## 2022-11-09 DIAGNOSIS — L409 Psoriasis, unspecified: Secondary | ICD-10-CM | POA: Diagnosis not present

## 2022-11-09 DIAGNOSIS — M797 Fibromyalgia: Secondary | ICD-10-CM | POA: Diagnosis not present

## 2022-11-09 DIAGNOSIS — K760 Fatty (change of) liver, not elsewhere classified: Secondary | ICD-10-CM | POA: Diagnosis not present

## 2022-11-09 DIAGNOSIS — M300 Polyarteritis nodosa: Secondary | ICD-10-CM | POA: Diagnosis not present

## 2022-11-09 DIAGNOSIS — G629 Polyneuropathy, unspecified: Secondary | ICD-10-CM | POA: Diagnosis not present

## 2022-11-09 DIAGNOSIS — R252 Cramp and spasm: Secondary | ICD-10-CM | POA: Diagnosis not present

## 2022-11-09 DIAGNOSIS — M5136 Other intervertebral disc degeneration, lumbar region: Secondary | ICD-10-CM | POA: Diagnosis not present

## 2022-11-09 DIAGNOSIS — E538 Deficiency of other specified B group vitamins: Secondary | ICD-10-CM | POA: Diagnosis not present

## 2022-11-09 DIAGNOSIS — K219 Gastro-esophageal reflux disease without esophagitis: Secondary | ICD-10-CM | POA: Diagnosis not present

## 2022-11-09 DIAGNOSIS — Z87440 Personal history of urinary (tract) infections: Secondary | ICD-10-CM | POA: Diagnosis not present

## 2022-11-09 DIAGNOSIS — I872 Venous insufficiency (chronic) (peripheral): Secondary | ICD-10-CM | POA: Diagnosis not present

## 2022-11-09 DIAGNOSIS — F32A Depression, unspecified: Secondary | ICD-10-CM | POA: Diagnosis not present

## 2022-11-09 DIAGNOSIS — G4733 Obstructive sleep apnea (adult) (pediatric): Secondary | ICD-10-CM | POA: Diagnosis not present

## 2022-11-09 DIAGNOSIS — M4802 Spinal stenosis, cervical region: Secondary | ICD-10-CM | POA: Diagnosis not present

## 2022-11-09 DIAGNOSIS — Z9181 History of falling: Secondary | ICD-10-CM | POA: Diagnosis not present

## 2022-11-09 DIAGNOSIS — E559 Vitamin D deficiency, unspecified: Secondary | ICD-10-CM | POA: Diagnosis not present

## 2022-11-09 DIAGNOSIS — G43909 Migraine, unspecified, not intractable, without status migrainosus: Secondary | ICD-10-CM | POA: Diagnosis not present

## 2022-11-09 DIAGNOSIS — I10 Essential (primary) hypertension: Secondary | ICD-10-CM | POA: Diagnosis not present

## 2022-11-09 DIAGNOSIS — E785 Hyperlipidemia, unspecified: Secondary | ICD-10-CM | POA: Diagnosis not present

## 2022-11-09 NOTE — Telephone Encounter (Signed)
Pt called back with home health nurse [name] on the phone . [name] states she can take a verbal order for the labs due to staffing she will draw today because they do not have the staff to send someone back out to draw them . She will fax results to our office . I gave her the verbal for the CBC ,TSH, Magnesium , CMP

## 2022-11-09 NOTE — Progress Notes (Signed)
Virtual Visit via Video   I connected with patient on 11/09/22 at 11:00 AM EDT by a video enabled telemedicine application and verified that I am speaking with the correct person using two identifiers.  Location patient: Home Location provider: [place] [facility] Persons participating in the virtual visit: Patient, Provider, CMA Sheryle Hail C)  I discussed the limitations of evaluation and management by telemedicine and the availability of in person appointments. The patient expressed understanding and agreed to proceed.  Subjective:   HPI:   Leg Cramps- pt reports she has had pretty intense leg cramps at night the last few days.  Thankfully last night was better.  Pt reports good water intake.  Pt is eating bananas.  Pt's HH nurse is coming today.  ROS:   See pertinent positives and negatives per HPI.  Patient Active Problem List   Diagnosis Date Noted   Nocturnal hypoxemia 06/21/2022   Elevated LFTs 05/06/2022   Obesity hypoventilation syndrome (HCC) 05/06/2022   Acute respiratory failure with hypoxia (HCC) 04/22/2022   Hypokalemia 04/22/2022   Leg cramps 01/12/2022   CAP (community acquired pneumonia) 08/12/2021   Fever of unknown origin 08/12/2021   Sacral pressure sore 07/06/2021   Vitamin B6 deficiency 06/08/2021   Myelomalacia of cervical cord (HCC) 06/06/2021   Compressive cervical cord myelomalacia (HCC) 06/05/2021   B12 deficiency 06/05/2021   Folate deficiency 06/05/2021   Vitamin D deficiency 06/05/2021   Leg weakness, bilateral 06/04/2021   Cervical atypia 01/06/2021   Steatosis of liver 01/06/2021   Hyponatremia 07/27/2019   Sepsis (HCC) 07/26/2019   Urinary urgency 04/04/2018   Chronic narcotic use 09/07/2016   Acute blood loss anemia 01/14/2016   Multiple gastric ulcers    PAN (polyarteritis nodosa) (HCC) 11/24/2015   GERD (gastroesophageal reflux disease) 01/08/2014   Routine general medical examination at a health care facility 04/23/2013    Bariatric surgery status 10/31/2012   S/P skin biopsy 10/31/2012   Psoriasis 01/09/2012   DDD (degenerative disc disease), lumbar 11/30/2011   Migraine headache    [name] (obstructive sleep apnea) 11/16/2010   HYPERGLYCEMIA, FASTING 10/08/2009   CONTRACTURE OF TENDON 08/17/2009   PARESTHESIA, HANDS 12/23/2008   Leg pain, right 11/05/2008   ADVERSE DRUG REACTION 04/03/2008   PERIPHERAL EDEMA 03/26/2008   Obesity, morbid, BMI 50 or higher (HCC) 01/03/2007   Cellulitis 01/03/2007   Hyperlipidemia 09/22/2006   Fibromyalgia 09/20/2006   Anxiety and depression 06/28/2006   Essential hypertension 06/28/2006    Social History   Tobacco Use   Smoking status: Never   Smokeless tobacco: Never  Substance Use Topics   Alcohol use: No    Current Outpatient Medications:    albuterol (VENTOLIN HFA) 108 (90 Base) MCG/ACT inhaler, Inhale 2 puffs into the lungs every 6 (six) hours as needed for wheezing or shortness of breath., Disp: 8 g, Rfl: 0   ALPRAZolam (XANAX) 0.5 MG tablet, Take 1 tablet (0.5 mg total) by mouth 2 (two) times daily., Disp: 60 tablet, Rfl: 3   ARIPiprazole (ABILIFY) 15 MG tablet, TAKE 1 TABLET BY MOUTH EVERY DAY, Disp: 90 tablet, Rfl: 1   Armodafinil 250 MG tablet, Take 1 tablet (250 mg total) by mouth in the morning and at bedtime., Disp: 60 tablet, Rfl: 3   Biotin 10 MG TABS, Take 1 tablet (10 mg total) by mouth in the morning., Disp: 90 tablet, Rfl: 1   buPROPion (WELLBUTRIN XL) 300 MG 24 hr tablet, TAKE 1 TABLET BY MOUTH EVERY DAY, Disp: 30  tablet, Rfl: 2   calcium citrate (CALCITRATE - DOSED IN MG ELEMENTAL CALCIUM) 950 (200 Ca) MG tablet, Take 1 tablet (200 mg of elemental calcium total) by mouth daily., Disp: 30 tablet, Rfl: 3   clonazePAM (KLONOPIN) 1 MG tablet, TAKE 1 TABLET BY MOUTH AT BEDTIME, Disp: 30 tablet, Rfl: 3   cyanocobalamin (VITAMIN B12) 1000 MCG tablet, TAKE 1 TABLET BY MOUTH EVERY DAY, Disp: 30 tablet, Rfl: 2   cyclobenzaprine (FLEXERIL) 10 MG tablet,  TAKE 2 TABLETS BY MOUTH 2 TIMES DAILY, Disp: 120 tablet, Rfl: 2   diclofenac Sodium (VOLTAREN) 1 % GEL, Apply 4 g topically 4 (four) times daily., Disp: 1 g, Rfl: 1   doxycycline (VIBRA-TABS) 100 MG tablet, Take 1 tablet (100 mg total) by mouth 2 (two) times daily., Disp: 20 tablet, Rfl: 0   DULoxetine (CYMBALTA) 60 MG capsule, Take 2 capsules (120 mg total) by mouth daily. (Patient taking differently: Take 60 mg by mouth 2 (two) times daily.), Disp: 240 capsule, Rfl: 2   FEROSUL 325 (65 Fe) MG tablet, TAKE 1 TABLET BY MOUTH EVERY MORNING WITH BREAKFAST, Disp: 30 tablet, Rfl: 2   fluticasone-salmeterol (ADVAIR DISKUS) 100-50 MCG/ACT AEPB, Inhale 1 puff into the lungs 2 (two) times daily., Disp: 60 each, Rfl: 5   folic acid (FOLVITE) 1 MG tablet, TAKE 1 TABLET BY MOUTH EVERY DAY, Disp: 30 tablet, Rfl: 2   gabapentin (NEURONTIN) 600 MG tablet, Take 1 tablet (600 mg total) by mouth 3 (three) times daily. TAKE 2 TABLETS BY MOUTH 3 TIMES DAILY (Patient taking differently: Take 600 mg by mouth 3 (three) times daily.), Disp: 270 tablet, Rfl: 0   GNP VITAMIN B-1 100 MG tablet, TAKE 1 TABLET BY MOUTH EVERY DAY, Disp: 30 tablet, Rfl: 2   HYDROcodone-acetaminophen (NORCO) 10-325 MG tablet, TAKE 1 TABLET BY MOUTH EVERY 8 HOURS AS NEEDED, Disp: 90 tablet, Rfl: 0   nebivolol (BYSTOLIC) 10 MG tablet, TAKE 1 TABLET BY MOUTH EVERY DAY, Disp: 30 tablet, Rfl: 2   nystatin (MYCOSTATIN/NYSTOP) powder, APPLY 1 APPLICATION TOPICALLY 3 TIMES DAILY, Disp: 60 g, Rfl: 3   Oxcarbazepine (TRILEPTAL) 300 MG tablet, Take 300 mg by mouth 2 (two) times daily., Disp: , Rfl:    pantoprazole (PROTONIX) 40 MG tablet, TAKE 1 TABLET BY MOUTH 2 TIMES DAILY, Disp: 60 tablet, Rfl: 2   phentermine 37.5 MG capsule, Take 1 capsule (37.5 mg total) by mouth every morning., Disp: 90 capsule, Rfl: 0   promethazine (PHENERGAN) 25 MG tablet, TAKE 1 TABLET BY MOUTH EVERY 6 HOURS AS NEEDED FOR NAUSEA AND VOMITING, Disp: 45 tablet, Rfl: 3   pyridOXINE  (VITAMIN B6) 100 MG tablet, TAKE 1 TABLET BY MOUTH EVERY DAY, Disp: 30 tablet, Rfl: 2  Allergies  Allergen Reactions   Niacin Anaphylaxis, Shortness Of Breath and Swelling    Swelling and "problems breathing"   Sulfamethoxazole-Trimethoprim Anaphylaxis, Swelling, Rash and Other (See Comments)    Throat closed and the eyes became swollen   Aspirin Hives   Bactrim Hives   Benzoin Compound Rash   Cephalexin Hives    Tolerated ceftriaxone and cefepime 07/2019   Iohexol Hives    Pt treated with PO benedryl   Lisinopril Hives   Naproxen Hives   Pnu-Imune [Pneumococcal Polysaccharide Vaccine] Rash   Sulfonamide Derivatives Rash    Objective:   There were no vitals taken for this visit. AAOx3, NAD NCAT, EOMI No obvious CN deficits Coloring WNL Pt is able to speak clearly, coherently without shortness  of breath or increased work of breathing.  Thought process is linear.  Mood is appropriate.   Assessment and Plan:   Leg cramps- deteriorated.  Pt had been doing well but 6 days ago developed intense nocturnal cramping again.  Last night seemed better but she is fearful her labs are 'messed up' again.  She reports good water intake and is eating bananas.  Will check CMP, CBC, TSH, Mag levels and tx any abnormalities if needed.  Pt expressed understanding and is in agreement w/ plan.    Neena Rhymes, MD 11/09/2022

## 2022-11-10 ENCOUNTER — Encounter: Payer: Self-pay | Admitting: Family Medicine

## 2022-11-10 DIAGNOSIS — E785 Hyperlipidemia, unspecified: Secondary | ICD-10-CM | POA: Diagnosis not present

## 2022-11-10 DIAGNOSIS — G629 Polyneuropathy, unspecified: Secondary | ICD-10-CM | POA: Diagnosis not present

## 2022-11-10 DIAGNOSIS — Z9181 History of falling: Secondary | ICD-10-CM | POA: Diagnosis not present

## 2022-11-10 DIAGNOSIS — M300 Polyarteritis nodosa: Secondary | ICD-10-CM | POA: Diagnosis not present

## 2022-11-10 DIAGNOSIS — I872 Venous insufficiency (chronic) (peripheral): Secondary | ICD-10-CM | POA: Diagnosis not present

## 2022-11-10 DIAGNOSIS — K219 Gastro-esophageal reflux disease without esophagitis: Secondary | ICD-10-CM | POA: Diagnosis not present

## 2022-11-10 DIAGNOSIS — G4733 Obstructive sleep apnea (adult) (pediatric): Secondary | ICD-10-CM | POA: Diagnosis not present

## 2022-11-10 DIAGNOSIS — M4802 Spinal stenosis, cervical region: Secondary | ICD-10-CM | POA: Diagnosis not present

## 2022-11-10 DIAGNOSIS — M5136 Other intervertebral disc degeneration, lumbar region: Secondary | ICD-10-CM | POA: Diagnosis not present

## 2022-11-10 DIAGNOSIS — I10 Essential (primary) hypertension: Secondary | ICD-10-CM | POA: Diagnosis not present

## 2022-11-10 DIAGNOSIS — G9529 Other cord compression: Secondary | ICD-10-CM | POA: Diagnosis not present

## 2022-11-10 DIAGNOSIS — M797 Fibromyalgia: Secondary | ICD-10-CM | POA: Diagnosis not present

## 2022-11-10 DIAGNOSIS — G43909 Migraine, unspecified, not intractable, without status migrainosus: Secondary | ICD-10-CM | POA: Diagnosis not present

## 2022-11-10 DIAGNOSIS — L409 Psoriasis, unspecified: Secondary | ICD-10-CM | POA: Diagnosis not present

## 2022-11-10 DIAGNOSIS — Z87440 Personal history of urinary (tract) infections: Secondary | ICD-10-CM | POA: Diagnosis not present

## 2022-11-10 DIAGNOSIS — E538 Deficiency of other specified B group vitamins: Secondary | ICD-10-CM | POA: Diagnosis not present

## 2022-11-10 DIAGNOSIS — F32A Depression, unspecified: Secondary | ICD-10-CM | POA: Diagnosis not present

## 2022-11-10 DIAGNOSIS — E559 Vitamin D deficiency, unspecified: Secondary | ICD-10-CM | POA: Diagnosis not present

## 2022-11-10 DIAGNOSIS — K760 Fatty (change of) liver, not elsewhere classified: Secondary | ICD-10-CM | POA: Diagnosis not present

## 2022-11-10 LAB — LAB REPORT - SCANNED: EGFR: 106

## 2022-11-11 ENCOUNTER — Telehealth: Payer: Self-pay | Admitting: Family Medicine

## 2022-11-11 NOTE — Telephone Encounter (Signed)
Caller name: DANAJHA WIEHE  On DPR?: Yes  Call back number: [phone] (mobile)  Provider they see: Sheliah Hatch, MD  Reason for call:  Would like results of lastest lab work

## 2022-11-11 NOTE — Telephone Encounter (Signed)
Pt did not answered when called sent results my chart

## 2022-11-11 NOTE — Telephone Encounter (Signed)
Reviewed labs that were scanned- all look good!  No obvious cause of cramping

## 2022-11-11 NOTE — Telephone Encounter (Signed)
I know we gave the verbal orders for blood work by home health on Wed  11/09/22. Have you seen the results as of today ? Pt has called twice

## 2022-11-14 DIAGNOSIS — Z87440 Personal history of urinary (tract) infections: Secondary | ICD-10-CM | POA: Diagnosis not present

## 2022-11-14 DIAGNOSIS — K219 Gastro-esophageal reflux disease without esophagitis: Secondary | ICD-10-CM | POA: Diagnosis not present

## 2022-11-14 DIAGNOSIS — E559 Vitamin D deficiency, unspecified: Secondary | ICD-10-CM | POA: Diagnosis not present

## 2022-11-14 DIAGNOSIS — G4733 Obstructive sleep apnea (adult) (pediatric): Secondary | ICD-10-CM | POA: Diagnosis not present

## 2022-11-14 DIAGNOSIS — M4802 Spinal stenosis, cervical region: Secondary | ICD-10-CM | POA: Diagnosis not present

## 2022-11-14 DIAGNOSIS — F32A Depression, unspecified: Secondary | ICD-10-CM | POA: Diagnosis not present

## 2022-11-14 DIAGNOSIS — G9529 Other cord compression: Secondary | ICD-10-CM | POA: Diagnosis not present

## 2022-11-14 DIAGNOSIS — I872 Venous insufficiency (chronic) (peripheral): Secondary | ICD-10-CM | POA: Diagnosis not present

## 2022-11-14 DIAGNOSIS — M797 Fibromyalgia: Secondary | ICD-10-CM | POA: Diagnosis not present

## 2022-11-14 DIAGNOSIS — K760 Fatty (change of) liver, not elsewhere classified: Secondary | ICD-10-CM | POA: Diagnosis not present

## 2022-11-14 DIAGNOSIS — I10 Essential (primary) hypertension: Secondary | ICD-10-CM | POA: Diagnosis not present

## 2022-11-14 DIAGNOSIS — M5136 Other intervertebral disc degeneration, lumbar region: Secondary | ICD-10-CM | POA: Diagnosis not present

## 2022-11-14 DIAGNOSIS — E538 Deficiency of other specified B group vitamins: Secondary | ICD-10-CM | POA: Diagnosis not present

## 2022-11-14 DIAGNOSIS — L409 Psoriasis, unspecified: Secondary | ICD-10-CM | POA: Diagnosis not present

## 2022-11-14 DIAGNOSIS — Z9181 History of falling: Secondary | ICD-10-CM | POA: Diagnosis not present

## 2022-11-14 DIAGNOSIS — G43909 Migraine, unspecified, not intractable, without status migrainosus: Secondary | ICD-10-CM | POA: Diagnosis not present

## 2022-11-14 DIAGNOSIS — E785 Hyperlipidemia, unspecified: Secondary | ICD-10-CM | POA: Diagnosis not present

## 2022-11-14 DIAGNOSIS — M300 Polyarteritis nodosa: Secondary | ICD-10-CM | POA: Diagnosis not present

## 2022-11-14 DIAGNOSIS — G629 Polyneuropathy, unspecified: Secondary | ICD-10-CM | POA: Diagnosis not present

## 2022-11-15 ENCOUNTER — Telehealth: Payer: Self-pay | Admitting: Family Medicine

## 2022-11-15 ENCOUNTER — Telehealth: Payer: 59 | Admitting: Family Medicine

## 2022-11-15 ENCOUNTER — Telehealth (INDEPENDENT_AMBULATORY_CARE_PROVIDER_SITE_OTHER): Payer: 59 | Admitting: Urgent Care

## 2022-11-15 ENCOUNTER — Encounter: Payer: Self-pay | Admitting: Urgent Care

## 2022-11-15 DIAGNOSIS — Z87440 Personal history of urinary (tract) infections: Secondary | ICD-10-CM | POA: Diagnosis not present

## 2022-11-15 DIAGNOSIS — G629 Polyneuropathy, unspecified: Secondary | ICD-10-CM | POA: Diagnosis not present

## 2022-11-15 DIAGNOSIS — M300 Polyarteritis nodosa: Secondary | ICD-10-CM | POA: Diagnosis not present

## 2022-11-15 DIAGNOSIS — I872 Venous insufficiency (chronic) (peripheral): Secondary | ICD-10-CM | POA: Diagnosis not present

## 2022-11-15 DIAGNOSIS — G9529 Other cord compression: Secondary | ICD-10-CM | POA: Diagnosis not present

## 2022-11-15 DIAGNOSIS — K219 Gastro-esophageal reflux disease without esophagitis: Secondary | ICD-10-CM | POA: Diagnosis not present

## 2022-11-15 DIAGNOSIS — E559 Vitamin D deficiency, unspecified: Secondary | ICD-10-CM | POA: Diagnosis not present

## 2022-11-15 DIAGNOSIS — Z9181 History of falling: Secondary | ICD-10-CM | POA: Diagnosis not present

## 2022-11-15 DIAGNOSIS — M797 Fibromyalgia: Secondary | ICD-10-CM | POA: Diagnosis not present

## 2022-11-15 DIAGNOSIS — F32A Depression, unspecified: Secondary | ICD-10-CM | POA: Diagnosis not present

## 2022-11-15 DIAGNOSIS — L409 Psoriasis, unspecified: Secondary | ICD-10-CM | POA: Diagnosis not present

## 2022-11-15 DIAGNOSIS — M5136 Other intervertebral disc degeneration, lumbar region: Secondary | ICD-10-CM | POA: Diagnosis not present

## 2022-11-15 DIAGNOSIS — E785 Hyperlipidemia, unspecified: Secondary | ICD-10-CM | POA: Diagnosis not present

## 2022-11-15 DIAGNOSIS — G4733 Obstructive sleep apnea (adult) (pediatric): Secondary | ICD-10-CM | POA: Diagnosis not present

## 2022-11-15 DIAGNOSIS — K760 Fatty (change of) liver, not elsewhere classified: Secondary | ICD-10-CM | POA: Diagnosis not present

## 2022-11-15 DIAGNOSIS — L03116 Cellulitis of left lower limb: Secondary | ICD-10-CM | POA: Diagnosis not present

## 2022-11-15 DIAGNOSIS — I10 Essential (primary) hypertension: Secondary | ICD-10-CM | POA: Diagnosis not present

## 2022-11-15 DIAGNOSIS — G43909 Migraine, unspecified, not intractable, without status migrainosus: Secondary | ICD-10-CM | POA: Diagnosis not present

## 2022-11-15 DIAGNOSIS — E538 Deficiency of other specified B group vitamins: Secondary | ICD-10-CM | POA: Diagnosis not present

## 2022-11-15 DIAGNOSIS — M4802 Spinal stenosis, cervical region: Secondary | ICD-10-CM | POA: Diagnosis not present

## 2022-11-15 MED ORDER — DICLOXACILLIN SODIUM 250 MG PO CAPS: 250.0000 mg | ORAL_CAPSULE | Freq: Four times a day (QID) | ORAL | 0 refills | Status: DC

## 2022-11-15 NOTE — Assessment & Plan Note (Addendum)
Pt will monitor for any extension of erythema past the margins drawn today, also to monitor for lymphangitis. Pt has reported hx of hives with cephalexin per chart, however pt tells me today that she has taken this numerous times without adverse reaction. She also states she has no allergy to PCN. Home health nurse will be performing another assessment on patient in the next 6 days.

## 2022-11-15 NOTE — Telephone Encounter (Signed)
I have made her a video visit for today at 140 pm with [place]

## 2022-11-15 NOTE — Telephone Encounter (Signed)
Put on schedule for [place], [place] .

## 2022-11-15 NOTE — Telephone Encounter (Signed)
Caller name: MERSADIES LEAK  On DPR?: Yes  Call back number: [phone] (home)  Provider they see: Sheliah Hatch, MD  Reason for call:   L leg and ankle 'acting up' and she would like an antibiotic

## 2022-11-15 NOTE — Patient Instructions (Signed)
Pt will monitor the area of her leg, notify her home health nurse or our office if there is any extension of erythema/ warmth past the margins drawn this morning. Pt also will monitor for s/sx of systemic infection to include but not limited to fever, lethargy, myalgias, N/V which would warrant ER visit. Take the dicloxacillin every 6 hours (4 times daily) until gone (one week).

## 2022-11-15 NOTE — Progress Notes (Signed)
MyChart Video Visit    Virtual Visit via Video Note    Patient location: Home. Patient and provider in visit Provider location: Office  I discussed the limitations of evaluation and management by telemedicine and the availability of in person appointments. The patient expressed understanding and agreed to proceed.  Visit Date: 11/15/2022  Today's healthcare provider: Maretta Bees, [place]     Subjective:    Patient ID: [name], female    DOB: [birth year], 52 y.o.   MRN: [id number]  Chief Complaint  Patient presents with   Leg Pain    Pt states her left leg is red and hurts  [name] from home health called and states the pt is red , striking some  Pt denies any swelling     Leg Pain    Video visit performed today for possible cellulitis. Chenelle has a known hx of recurrent cellulitis in the past believes last episode was >6 months ago. She is bed ridden and has chronic peripheral edema. She was seen this morning by her home health nurse who has been caring for her for the past 15 months and knows her well. She was concerned about BLE erythema, much worse on the L with some warmth. This extends from the ankle to the distal 1/3 of her L lower extremity. Pt denies severe pain in it. She denies systemic symptoms such as fever, GI sx, or new malaise/fatigue/weakness beyond her baseline. She denies any injury to her leg. Denies any calf pain or tenderness. No SOB or CP. She states the erythema is flush with the skin and not raised. There is no pruritus, no lymphangitis, no drainage. Pt denies any additional concerns.   Past Medical History:  Diagnosis Date   Allergic rhinitis    Ankle fracture, right    Anxiety    ASCUS of cervix with negative high risk HPV 05/2018   Carpal tunnel syndrome    Cervical cancer (HCC) 1998   Fibromyalgia    Hyperlipidemia    Hypertension    Migraine headache    Obesity    Sleep apnea     Past Surgical History:  Procedure Laterality Date    ABDOMINAL HYSTERECTOMY  10/1996   ABDOMINAL SURGERY  jan/11/2012   gastric bypass surgery   CARPAL TUNNEL RELEASE     bilateral    CESAREAN SECTION     CHOLECYSTECTOMY  07/1992   ESOPHAGOGASTRODUODENOSCOPY N/A 01/14/2016   Procedure: ESOPHAGOGASTRODUODENOSCOPY (EGD);  Surgeon: Napoleon Form, MD;  Location: [facility] ENDOSCOPY;  Service: Endoscopy;  Laterality: N/A;    Family History  Problem Relation Age of Onset   Diabetes Mother    Emphysema Mother    Allergies Mother    Hypertension Mother    Heart disease Father    Allergies Father    Prostate cancer Father    Breast cancer Maternal Grandmother 15    Social History   Socioeconomic History   Marital status: Single    Spouse name: Not on file   Number of children: Not on file   Years of education: Not on file   Highest education level: Not on file  Occupational History   Occupation: disabled. prev worked as a Scientist, physiological  Tobacco Use   Smoking status: Never   Smokeless tobacco: Never  Vaping Use   Vaping Use: Never used  Substance and Sexual Activity   Alcohol use: No   Drug use: No   Sexual activity: Yes    Comment: TAH-1st  intercourse 17 yo-5 partners  Other Topics Concern   Not on file  Social History Narrative   Not on file   Social Determinants of Health   Financial Resource Strain: Low Risk  (11/02/2022)   Overall Financial Resource Strain (CARDIA)    Difficulty of Paying Living Expenses: Not hard at all  Food Insecurity: No Food Insecurity (11/02/2022)   Hunger Vital Sign    Worried About Running Out of Food in the Last Year: Never true    Ran Out of Food in the Last Year: Never true  Transportation Needs: No Transportation Needs (11/02/2022)   PRAPARE - Administrator, Civil Service (Medical): No    Lack of Transportation (Non-Medical): No  Physical Activity: Inactive (11/02/2022)   Exercise Vital Sign    Days of Exercise per Week: 0 days    Minutes of Exercise per Session: 0 min  Stress:  Stress Concern Present (11/02/2022)   [name] of Occupational Health - Occupational Stress Questionnaire    Feeling of Stress : Rather much  Social Connections: Socially Isolated (11/02/2022)   Social Connection and Isolation Panel [NHANES]    Frequency of Communication with Friends and Family: Once a week    Frequency of Social Gatherings with Friends and Family: Once a week    Attends Religious Services: Never    Database administrator or Organizations: No    Attends Banker Meetings: Never    Marital Status: Divorced  Catering manager Violence: Not At Risk (11/02/2022)   Humiliation, Afraid, Rape, and Kick questionnaire    Fear of Current or Ex-Partner: No    Emotionally Abused: No    Physically Abused: No    Sexually Abused: No    Outpatient Medications Prior to Visit  Medication Sig Dispense Refill   albuterol (VENTOLIN HFA) 108 (90 Base) MCG/ACT inhaler Inhale 2 puffs into the lungs every 6 (six) hours as needed for wheezing or shortness of breath. 8 g 0   ALPRAZolam (XANAX) 0.5 MG tablet Take 1 tablet (0.5 mg total) by mouth 2 (two) times daily. 60 tablet 3   ARIPiprazole (ABILIFY) 15 MG tablet TAKE 1 TABLET BY MOUTH EVERY DAY 90 tablet 1   Armodafinil 250 MG tablet Take 1 tablet (250 mg total) by mouth in the morning and at bedtime. 60 tablet 3   Biotin 10 MG TABS Take 1 tablet (10 mg total) by mouth in the morning. 90 tablet 1   buPROPion (WELLBUTRIN XL) 300 MG 24 hr tablet TAKE 1 TABLET BY MOUTH EVERY DAY 30 tablet 2   calcium citrate (CALCITRATE - DOSED IN MG ELEMENTAL CALCIUM) 950 (200 Ca) MG tablet Take 1 tablet (200 mg of elemental calcium total) by mouth daily. 30 tablet 3   clonazePAM (KLONOPIN) 1 MG tablet TAKE 1 TABLET BY MOUTH AT BEDTIME 30 tablet 3   cyanocobalamin (VITAMIN B12) 1000 MCG tablet TAKE 1 TABLET BY MOUTH EVERY DAY 30 tablet 2   cyclobenzaprine (FLEXERIL) 10 MG tablet TAKE 2 TABLETS BY MOUTH 2 TIMES DAILY 120 tablet 2   diclofenac  Sodium (VOLTAREN) 1 % GEL Apply 4 g topically 4 (four) times daily. 1 g 1   DULoxetine (CYMBALTA) 60 MG capsule Take 2 capsules (120 mg total) by mouth daily. (Patient taking differently: Take 60 mg by mouth 2 (two) times daily.) 240 capsule 2   FEROSUL 325 (65 Fe) MG tablet TAKE 1 TABLET BY MOUTH EVERY MORNING WITH BREAKFAST 30 tablet 2  fluticasone-salmeterol (ADVAIR DISKUS) 100-50 MCG/ACT AEPB Inhale 1 puff into the lungs 2 (two) times daily. 60 each 5   folic acid (FOLVITE) 1 MG tablet TAKE 1 TABLET BY MOUTH EVERY DAY 30 tablet 2   gabapentin (NEURONTIN) 600 MG tablet Take 1 tablet (600 mg total) by mouth 3 (three) times daily. TAKE 2 TABLETS BY MOUTH 3 TIMES DAILY (Patient taking differently: Take 600 mg by mouth 3 (three) times daily.) 270 tablet 0   GNP VITAMIN B-1 100 MG tablet TAKE 1 TABLET BY MOUTH EVERY DAY 30 tablet 2   HYDROcodone-acetaminophen (NORCO) 10-325 MG tablet TAKE 1 TABLET BY MOUTH EVERY 8 HOURS AS NEEDED 90 tablet 0   nebivolol (BYSTOLIC) 10 MG tablet TAKE 1 TABLET BY MOUTH EVERY DAY 30 tablet 2   nystatin (MYCOSTATIN/NYSTOP) powder APPLY 1 APPLICATION TOPICALLY 3 TIMES DAILY 60 g 3   Oxcarbazepine (TRILEPTAL) 300 MG tablet Take 300 mg by mouth 2 (two) times daily.     pantoprazole (PROTONIX) 40 MG tablet TAKE 1 TABLET BY MOUTH 2 TIMES DAILY 60 tablet 2   phentermine 37.5 MG capsule Take 1 capsule (37.5 mg total) by mouth every morning. 90 capsule 0   promethazine (PHENERGAN) 25 MG tablet TAKE 1 TABLET BY MOUTH EVERY 6 HOURS AS NEEDED FOR NAUSEA AND VOMITING 45 tablet 3   pyridOXINE (VITAMIN B6) 100 MG tablet TAKE 1 TABLET BY MOUTH EVERY DAY 30 tablet 2   doxycycline (VIBRA-TABS) 100 MG tablet Take 1 tablet (100 mg total) by mouth 2 (two) times daily. 20 tablet 0   No facility-administered medications prior to visit.    Allergies  Allergen Reactions   Niacin Anaphylaxis, Shortness Of Breath and Swelling    Swelling and "problems breathing"    Sulfamethoxazole-Trimethoprim Anaphylaxis, Swelling, Rash and Other (See Comments)    Throat closed and the eyes became swollen   Aspirin Hives   Bactrim Hives   Benzoin Compound Rash   Cephalexin Hives    Tolerated ceftriaxone and cefepime 07/2019   Iohexol Hives    Pt treated with PO benedryl   Lisinopril Hives   Naproxen Hives   Pnu-Imune [Pneumococcal Polysaccharide Vaccine] Rash   Sulfonamide Derivatives Rash    ROS Pertinent positives noted in HPI    Objective:    Physical Exam Constitutional:      General: She is not in acute distress.    Appearance: She is obese. She is not ill-appearing, toxic-appearing or diaphoretic.     Comments: Patient laying on hospital bed in her living room, in no apparent distress. Speaking in complete sentences without breathlessness  HENT:     Head: Normocephalic.     Mouth/Throat:     Comments: edentulous Eyes:     General: No scleral icterus.       Right eye: No discharge.        Left eye: No discharge.  Pulmonary:     Effort: Pulmonary effort is normal. No respiratory distress.  Musculoskeletal:     Right lower leg: Edema present.     Left lower leg: Edema present.       Legs:     Comments: BLE edema appears equal bilaterally, not worse than baseline per pt Negative Homan sign to palpation by son (+) warmth, no drainage per son inspection via video  Skin:    General: Skin is warm.     Findings: Erythema present.  Neurological:     Mental Status: She is alert and oriented to person, place,  and time.     There were no vitals taken for this visit. Wt Readings from Last 3 Encounters:  10/27/22 289 lb (131.1 kg)  08/19/22 241 lb 6 oz (109.5 kg)  07/27/22 242 lb (109.8 kg)        Assessment & Plan:   Problem List Items Addressed This Visit       Other   Cellulitis - Primary    Pt will monitor for any extension of erythema past the margins drawn today, also to monitor for lymphangitis. Pt has reported hx of hives  with cephalexin per chart, however pt tells me today that she has taken this numerous times without adverse reaction. She also states she has no allergy to PCN. Home health nurse will be performing another assessment on patient in the next 6 days.       I have discontinued Shenese Monty. Bugh's doxycycline. I am also having her start on dicloxacillin. Additionally, I am having her maintain her DULoxetine, Oxcarbazepine, gabapentin, phentermine, albuterol, clonazePAM, ARIPiprazole, Biotin, Armodafinil, diclofenac Sodium, promethazine, nystatin, cyanocobalamin, folic acid, nebivolol, pyridOXINE, buPROPion, pantoprazole, FeroSul, GNP Vitamin B-1, cyclobenzaprine, ALPRAZolam, calcium citrate, fluticasone-salmeterol, and HYDROcodone-acetaminophen.  Meds ordered this encounter  Medications   dicloxacillin (DYNAPEN) 250 MG capsule    Sig: Take 1 capsule (250 mg total) by mouth 4 (four) times daily for 7 days.    Dispense:  28 capsule    Refill:  0    I discussed the assessment and treatment plan with the patient. The patient was provided an opportunity to ask questions and all were answered. The patient agreed with the plan and demonstrated an understanding of the instructions.   The patient was advised to call back or seek an in-person evaluation if the symptoms worsen or if the condition fails to improve as anticipated.  I provided 8 minutes of face-to-face time during this encounter.   Marg Macmaster Manley Mason, PA [facility] [employer] [facility] [phone] (phone) [phone] (fax)  [facility]

## 2022-11-17 DIAGNOSIS — E559 Vitamin D deficiency, unspecified: Secondary | ICD-10-CM | POA: Diagnosis not present

## 2022-11-17 DIAGNOSIS — I872 Venous insufficiency (chronic) (peripheral): Secondary | ICD-10-CM | POA: Diagnosis not present

## 2022-11-17 DIAGNOSIS — E785 Hyperlipidemia, unspecified: Secondary | ICD-10-CM | POA: Diagnosis not present

## 2022-11-17 DIAGNOSIS — Z87440 Personal history of urinary (tract) infections: Secondary | ICD-10-CM | POA: Diagnosis not present

## 2022-11-17 DIAGNOSIS — Z9181 History of falling: Secondary | ICD-10-CM | POA: Diagnosis not present

## 2022-11-17 DIAGNOSIS — M300 Polyarteritis nodosa: Secondary | ICD-10-CM | POA: Diagnosis not present

## 2022-11-17 DIAGNOSIS — M4802 Spinal stenosis, cervical region: Secondary | ICD-10-CM | POA: Diagnosis not present

## 2022-11-17 DIAGNOSIS — L409 Psoriasis, unspecified: Secondary | ICD-10-CM | POA: Diagnosis not present

## 2022-11-17 DIAGNOSIS — E538 Deficiency of other specified B group vitamins: Secondary | ICD-10-CM | POA: Diagnosis not present

## 2022-11-17 DIAGNOSIS — G43909 Migraine, unspecified, not intractable, without status migrainosus: Secondary | ICD-10-CM | POA: Diagnosis not present

## 2022-11-17 DIAGNOSIS — G9529 Other cord compression: Secondary | ICD-10-CM | POA: Diagnosis not present

## 2022-11-17 DIAGNOSIS — K219 Gastro-esophageal reflux disease without esophagitis: Secondary | ICD-10-CM | POA: Diagnosis not present

## 2022-11-17 DIAGNOSIS — I10 Essential (primary) hypertension: Secondary | ICD-10-CM | POA: Diagnosis not present

## 2022-11-17 DIAGNOSIS — F32A Depression, unspecified: Secondary | ICD-10-CM | POA: Diagnosis not present

## 2022-11-17 DIAGNOSIS — K760 Fatty (change of) liver, not elsewhere classified: Secondary | ICD-10-CM | POA: Diagnosis not present

## 2022-11-17 DIAGNOSIS — M5136 Other intervertebral disc degeneration, lumbar region: Secondary | ICD-10-CM | POA: Diagnosis not present

## 2022-11-17 DIAGNOSIS — M797 Fibromyalgia: Secondary | ICD-10-CM | POA: Diagnosis not present

## 2022-11-17 DIAGNOSIS — G4733 Obstructive sleep apnea (adult) (pediatric): Secondary | ICD-10-CM | POA: Diagnosis not present

## 2022-11-17 DIAGNOSIS — G629 Polyneuropathy, unspecified: Secondary | ICD-10-CM | POA: Diagnosis not present

## 2022-11-20 ENCOUNTER — Inpatient Hospital Stay (HOSPITAL_COMMUNITY)
Admission: EM | Admit: 2022-11-20 | Discharge: 2022-11-24 | DRG: 291 | Disposition: A | Discharge status: Home Health | Payer: 59 | Attending: Internal Medicine | Admitting: Internal Medicine

## 2022-11-20 ENCOUNTER — Emergency Department (HOSPITAL_COMMUNITY): Payer: 59

## 2022-11-20 ENCOUNTER — Encounter (HOSPITAL_COMMUNITY): Payer: Self-pay

## 2022-11-20 DIAGNOSIS — Z66 Do not resuscitate: Secondary | ICD-10-CM | POA: Diagnosis not present

## 2022-11-20 DIAGNOSIS — Z79899 Other long term (current) drug therapy: Secondary | ICD-10-CM

## 2022-11-20 DIAGNOSIS — Z7951 Long term (current) use of inhaled steroids: Secondary | ICD-10-CM

## 2022-11-20 DIAGNOSIS — R6889 Other general symptoms and signs: Secondary | ICD-10-CM | POA: Diagnosis not present

## 2022-11-20 DIAGNOSIS — J45909 Unspecified asthma, uncomplicated: Secondary | ICD-10-CM | POA: Diagnosis not present

## 2022-11-20 DIAGNOSIS — I110 Hypertensive heart disease with heart failure: Principal | ICD-10-CM | POA: Diagnosis present

## 2022-11-20 DIAGNOSIS — G894 Chronic pain syndrome: Secondary | ICD-10-CM | POA: Diagnosis not present

## 2022-11-20 DIAGNOSIS — F32A Depression, unspecified: Secondary | ICD-10-CM | POA: Diagnosis present

## 2022-11-20 DIAGNOSIS — Z9981 Dependence on supplemental oxygen: Secondary | ICD-10-CM

## 2022-11-20 DIAGNOSIS — F419 Anxiety disorder, unspecified: Secondary | ICD-10-CM | POA: Diagnosis present

## 2022-11-20 DIAGNOSIS — Z882 Allergy status to sulfonamides status: Secondary | ICD-10-CM

## 2022-11-20 DIAGNOSIS — G43909 Migraine, unspecified, not intractable, without status migrainosus: Secondary | ICD-10-CM | POA: Diagnosis present

## 2022-11-20 DIAGNOSIS — J9811 Atelectasis: Secondary | ICD-10-CM | POA: Diagnosis present

## 2022-11-20 DIAGNOSIS — J9611 Chronic respiratory failure with hypoxia: Secondary | ICD-10-CM | POA: Diagnosis present

## 2022-11-20 DIAGNOSIS — G9589 Other specified diseases of spinal cord: Secondary | ICD-10-CM | POA: Diagnosis not present

## 2022-11-20 DIAGNOSIS — G4733 Obstructive sleep apnea (adult) (pediatric): Secondary | ICD-10-CM | POA: Diagnosis present

## 2022-11-20 DIAGNOSIS — Z90710 Acquired absence of both cervix and uterus: Secondary | ICD-10-CM

## 2022-11-20 DIAGNOSIS — Z1152 Encounter for screening for COVID-19: Secondary | ICD-10-CM | POA: Diagnosis not present

## 2022-11-20 DIAGNOSIS — Z79891 Long term (current) use of opiate analgesic: Secondary | ICD-10-CM

## 2022-11-20 DIAGNOSIS — M797 Fibromyalgia: Secondary | ICD-10-CM | POA: Diagnosis present

## 2022-11-20 DIAGNOSIS — L03116 Cellulitis of left lower limb: Secondary | ICD-10-CM | POA: Diagnosis present

## 2022-11-20 DIAGNOSIS — L89312 Pressure ulcer of right buttock, stage 2: Secondary | ICD-10-CM | POA: Diagnosis not present

## 2022-11-20 DIAGNOSIS — Z8249 Family history of ischemic heart disease and other diseases of the circulatory system: Secondary | ICD-10-CM

## 2022-11-20 DIAGNOSIS — Z7401 Bed confinement status: Secondary | ICD-10-CM

## 2022-11-20 DIAGNOSIS — E785 Hyperlipidemia, unspecified: Secondary | ICD-10-CM | POA: Diagnosis not present

## 2022-11-20 DIAGNOSIS — Z6841 Body Mass Index (BMI) 40.0 and over, adult: Secondary | ICD-10-CM

## 2022-11-20 DIAGNOSIS — Z91041 Radiographic dye allergy status: Secondary | ICD-10-CM

## 2022-11-20 DIAGNOSIS — I5033 Acute on chronic diastolic (congestive) heart failure: Secondary | ICD-10-CM | POA: Diagnosis present

## 2022-11-20 DIAGNOSIS — N390 Urinary tract infection, site not specified: Secondary | ICD-10-CM | POA: Diagnosis not present

## 2022-11-20 DIAGNOSIS — Z743 Need for continuous supervision: Secondary | ICD-10-CM | POA: Diagnosis not present

## 2022-11-20 DIAGNOSIS — L0390 Cellulitis, unspecified: Secondary | ICD-10-CM | POA: Diagnosis present

## 2022-11-20 DIAGNOSIS — L8990 Pressure ulcer of unspecified site, unspecified stage: Secondary | ICD-10-CM | POA: Insufficient documentation

## 2022-11-20 DIAGNOSIS — Z9049 Acquired absence of other specified parts of digestive tract: Secondary | ICD-10-CM

## 2022-11-20 DIAGNOSIS — Z887 Allergy status to serum and vaccine status: Secondary | ICD-10-CM

## 2022-11-20 DIAGNOSIS — Z886 Allergy status to analgesic agent status: Secondary | ICD-10-CM

## 2022-11-20 DIAGNOSIS — J811 Chronic pulmonary edema: Secondary | ICD-10-CM | POA: Diagnosis not present

## 2022-11-20 DIAGNOSIS — J9601 Acute respiratory failure with hypoxia: Secondary | ICD-10-CM | POA: Diagnosis not present

## 2022-11-20 DIAGNOSIS — Z888 Allergy status to other drugs, medicaments and biological substances status: Secondary | ICD-10-CM

## 2022-11-20 DIAGNOSIS — Z8541 Personal history of malignant neoplasm of cervix uteri: Secondary | ICD-10-CM

## 2022-11-20 DIAGNOSIS — R0602 Shortness of breath: Secondary | ICD-10-CM | POA: Diagnosis not present

## 2022-11-20 DIAGNOSIS — Z9884 Bariatric surgery status: Secondary | ICD-10-CM | POA: Diagnosis not present

## 2022-11-20 DIAGNOSIS — R0689 Other abnormalities of breathing: Secondary | ICD-10-CM | POA: Diagnosis not present

## 2022-11-20 DIAGNOSIS — R0989 Other specified symptoms and signs involving the circulatory and respiratory systems: Secondary | ICD-10-CM | POA: Diagnosis not present

## 2022-11-20 DIAGNOSIS — Z881 Allergy status to other antibiotic agents status: Secondary | ICD-10-CM

## 2022-11-20 DIAGNOSIS — Z825 Family history of asthma and other chronic lower respiratory diseases: Secondary | ICD-10-CM

## 2022-11-20 DIAGNOSIS — Z8701 Personal history of pneumonia (recurrent): Secondary | ICD-10-CM

## 2022-11-20 LAB — CBC WITH DIFFERENTIAL/PLATELET
Abs Immature Granulocytes: 0.05 10*3/uL (ref 0.00–0.07)
Basophils Absolute: 0.1 10*3/uL (ref 0.0–0.1)
Basophils Relative: 1 %
Eosinophils Absolute: 0.4 10*3/uL (ref 0.0–0.5)
Eosinophils Relative: 3 %
HCT: 45.1 % (ref 36.0–46.0)
Hemoglobin: 13.7 g/dL (ref 12.0–15.0)
Immature Granulocytes: 0 %
Lymphocytes Relative: 11 %
Lymphs Abs: 1.4 10*3/uL (ref 0.7–4.0)
MCH: 28.7 pg (ref 26.0–34.0)
MCHC: 30.4 g/dL (ref 30.0–36.0)
MCV: 94.4 fL (ref 80.0–100.0)
Monocytes Absolute: 0.6 10*3/uL (ref 0.1–1.0)
Monocytes Relative: 4 %
Neutro Abs: 10.8 10*3/uL — ABNORMAL HIGH (ref 1.7–7.7)
Neutrophils Relative %: 81 %
Platelets: 309 10*3/uL (ref 150–400)
RBC: 4.78 MIL/uL (ref 3.87–5.11)
RDW: 13.8 % (ref 11.5–15.5)
WBC: 13.3 10*3/uL — ABNORMAL HIGH (ref 4.0–10.5)
nRBC: 0 % (ref 0.0–0.2)

## 2022-11-20 LAB — COMPREHENSIVE METABOLIC PANEL
ALT: 17 U/L (ref 0–44)
AST: 24 U/L (ref 15–41)
Albumin: 3 g/dL — ABNORMAL LOW (ref 3.5–5.0)
Alkaline Phosphatase: 121 U/L (ref 38–126)
Anion gap: 11 (ref 5–15)
BUN: 10 mg/dL (ref 6–20)
CO2: 27 mmol/L (ref 22–32)
Calcium: 8.6 mg/dL — ABNORMAL LOW (ref 8.9–10.3)
Chloride: 95 mmol/L — ABNORMAL LOW (ref 98–111)
Creatinine, Ser: 0.73 mg/dL (ref 0.44–1.00)
GFR, Estimated: >60 mL/min (ref >60)
Glucose, Bld: 100 mg/dL — ABNORMAL HIGH (ref 70–99)
Potassium: 4.3 mmol/L (ref 3.5–5.1)
Sodium: 133 mmol/L — ABNORMAL LOW (ref 135–145)
Total Bilirubin: 0.3 mg/dL (ref 0.3–1.2)
Total Protein: 6.8 g/dL (ref 6.5–8.1)

## 2022-11-20 LAB — TROPONIN I (HIGH SENSITIVITY): Troponin I (High Sensitivity): 10 ng/L (ref <18)

## 2022-11-20 LAB — SARS CORONAVIRUS 2 BY RT PCR: SARS Coronavirus 2 by RT PCR: NEGATIVE

## 2022-11-20 LAB — BRAIN NATRIURETIC PEPTIDE: B Natriuretic Peptide: 34.5 pg/mL (ref 0.0–100.0)

## 2022-11-20 MED ORDER — DICLOXACILLIN SODIUM 250 MG PO CAPS
250.0000 mg | ORAL_CAPSULE | Freq: Four times a day (QID) | ORAL | Status: AC
  Administered 2022-11-20 – 2022-11-22 (×7): 250 mg via ORAL
  Filled 2022-11-20 (×8): qty 1

## 2022-11-20 MED ORDER — PANTOPRAZOLE SODIUM 40 MG PO TBEC
40.0000 mg | DELAYED_RELEASE_TABLET | Freq: Two times a day (BID) | ORAL | Status: DC
  Administered 2022-11-20 – 2022-11-24 (×8): 40 mg via ORAL
  Filled 2022-11-20 (×9): qty 1

## 2022-11-20 MED ORDER — VITAMIN B-6 100 MG PO TABS
100.0000 mg | ORAL_TABLET | Freq: Every day | ORAL | Status: DC
  Administered 2022-11-21 – 2022-11-24 (×4): 100 mg via ORAL
  Filled 2022-11-20 (×4): qty 1

## 2022-11-20 MED ORDER — ALPRAZOLAM 0.25 MG PO TABS: 0.5000 mg | ORAL_TABLET | Freq: Two times a day (BID) | ORAL | Status: DC

## 2022-11-20 MED ORDER — ALPRAZOLAM 0.5 MG PO TABS
0.5000 mg | ORAL_TABLET | Freq: Two times a day (BID) | ORAL | Status: DC | PRN
  Administered 2022-11-20 – 2022-11-23 (×2): 0.5 mg via ORAL
  Filled 2022-11-20: qty 2
  Filled 2022-11-20: qty 1

## 2022-11-20 MED ORDER — ARMODAFINIL 250 MG PO TABS: 250.0000 mg | ORAL_TABLET | Freq: Two times a day (BID) | ORAL | Status: DC

## 2022-11-20 MED ORDER — OXCARBAZEPINE 300 MG PO TABS
300.0000 mg | ORAL_TABLET | Freq: Two times a day (BID) | ORAL | Status: DC
  Administered 2022-11-20 – 2022-11-24 (×8): 300 mg via ORAL
  Filled 2022-11-20 (×9): qty 1

## 2022-11-20 MED ORDER — FUROSEMIDE 10 MG/ML IJ SOLN
40.0000 mg | Freq: Two times a day (BID) | INTRAMUSCULAR | Status: DC
  Administered 2022-11-21 – 2022-11-24 (×7): 40 mg via INTRAVENOUS
  Filled 2022-11-20 (×7): qty 4

## 2022-11-20 MED ORDER — ENOXAPARIN SODIUM 40 MG/0.4ML IJ SOSY
40.0000 mg | PREFILLED_SYRINGE | INTRAMUSCULAR | Status: DC
  Administered 2022-11-21 – 2022-11-24 (×4): 40 mg via SUBCUTANEOUS
  Filled 2022-11-20 (×5): qty 0.4

## 2022-11-20 MED ORDER — DULOXETINE HCL 60 MG PO CPEP
60.0000 mg | ORAL_CAPSULE | Freq: Two times a day (BID) | ORAL | Status: DC
  Administered 2022-11-20 – 2022-11-24 (×8): 60 mg via ORAL
  Filled 2022-11-20: qty 1
  Filled 2022-11-20: qty 2
  Filled 2022-11-20 (×4): qty 1
  Filled 2022-11-20: qty 2
  Filled 2022-11-20: qty 1

## 2022-11-20 MED ORDER — MOMETASONE FURO-FORMOTEROL FUM 100-5 MCG/ACT IN AERO
2.0000 | INHALATION_SPRAY | Freq: Two times a day (BID) | RESPIRATORY_TRACT | Status: DC
  Administered 2022-11-21 – 2022-11-24 (×6): 2 via RESPIRATORY_TRACT
  Filled 2022-11-20 (×2): qty 8.8

## 2022-11-20 MED ORDER — ARIPIPRAZOLE 5 MG PO TABS
15.0000 mg | ORAL_TABLET | Freq: Every day | ORAL | Status: DC
  Administered 2022-11-21 – 2022-11-24 (×4): 15 mg via ORAL
  Filled 2022-11-20 (×2): qty 2
  Filled 2022-11-20 (×3): qty 3

## 2022-11-20 MED ORDER — NEBIVOLOL HCL 10 MG PO TABS
10.0000 mg | ORAL_TABLET | Freq: Every day | ORAL | Status: DC
  Administered 2022-11-21 – 2022-11-24 (×4): 10 mg via ORAL
  Filled 2022-11-20 (×4): qty 1

## 2022-11-20 MED ORDER — HYDROCODONE-ACETAMINOPHEN 10-325 MG PO TABS
1.0000 | ORAL_TABLET | Freq: Three times a day (TID) | ORAL | Status: DC | PRN
  Administered 2022-11-20: 1 via ORAL
  Filled 2022-11-20: qty 1

## 2022-11-20 MED ORDER — ONDANSETRON HCL 4 MG PO TABS: 4.0000 mg | ORAL_TABLET | Freq: Four times a day (QID) | ORAL | Status: DC | PRN

## 2022-11-20 MED ORDER — SENNOSIDES-DOCUSATE SODIUM 8.6-50 MG PO TABS: 1.0000 | ORAL_TABLET | Freq: Every evening | ORAL | Status: DC | PRN

## 2022-11-20 MED ORDER — CLONAZEPAM 0.5 MG PO TABS
1.0000 mg | ORAL_TABLET | Freq: Every day | ORAL | Status: DC
  Administered 2022-11-21 – 2022-11-23 (×3): 1 mg via ORAL
  Filled 2022-11-20 (×4): qty 2

## 2022-11-20 MED ORDER — ONDANSETRON HCL 4 MG/2ML IJ SOLN: 4.0000 mg | Freq: Four times a day (QID) | INTRAMUSCULAR | Status: DC | PRN

## 2022-11-20 MED ORDER — SODIUM CHLORIDE 0.9% FLUSH
3.0000 mL | Freq: Two times a day (BID) | INTRAVENOUS | Status: DC
  Administered 2022-11-20 – 2022-11-24 (×8): 3 mL via INTRAVENOUS

## 2022-11-20 MED ORDER — ALBUTEROL SULFATE (2.5 MG/3ML) 0.083% IN NEBU: 3.0000 mL | INHALATION_SOLUTION | Freq: Four times a day (QID) | RESPIRATORY_TRACT | Status: DC | PRN

## 2022-11-20 MED ORDER — ACETAMINOPHEN 650 MG RE SUPP: 650.0000 mg | Freq: Four times a day (QID) | RECTAL | Status: DC | PRN

## 2022-11-20 MED ORDER — BUPROPION HCL ER (XL) 150 MG PO TB24
300.0000 mg | ORAL_TABLET | Freq: Every day | ORAL | Status: DC
  Administered 2022-11-21 – 2022-11-24 (×4): 300 mg via ORAL
  Filled 2022-11-20 (×2): qty 2
  Filled 2022-11-20: qty 1
  Filled 2022-11-20: qty 2

## 2022-11-20 MED ORDER — ACETAMINOPHEN 325 MG PO TABS
650.0000 mg | ORAL_TABLET | Freq: Four times a day (QID) | ORAL | Status: DC | PRN
  Administered 2022-11-21 – 2022-11-22 (×2): 650 mg via ORAL
  Filled 2022-11-20 (×2): qty 2

## 2022-11-20 MED ORDER — FUROSEMIDE 10 MG/ML IJ SOLN
40.0000 mg | Freq: Once | INTRAMUSCULAR | Status: AC
  Administered 2022-11-20: 40 mg via INTRAVENOUS
  Filled 2022-11-20: qty 4

## 2022-11-20 MED ORDER — GABAPENTIN 300 MG PO CAPS
600.0000 mg | ORAL_CAPSULE | Freq: Two times a day (BID) | ORAL | Status: DC | PRN
  Administered 2022-11-20 – 2022-11-24 (×5): 600 mg via ORAL
  Filled 2022-11-20 (×5): qty 2

## 2022-11-20 MED ORDER — CLONAZEPAM 0.5 MG PO TABS
0.5000 mg | ORAL_TABLET | Freq: Once | ORAL | Status: AC
  Administered 2022-11-20: 0.5 mg via ORAL
  Filled 2022-11-20: qty 1

## 2022-11-20 NOTE — ED Triage Notes (Signed)
Pt BIB [facility] from home for c/o [facility] that started about an 1700. EMS reports pt was 84 % on room air upon their arrival. Place on nonrebreather at 15L, currently satting at 100%. Pt was diagnosed with a UTI on Friday and started on antibiotics. Denies chest pain.   BP 144/98 HR 94 CBG 109 RR 24

## 2022-11-20 NOTE — Hospital Course (Signed)
[name] is a 52 y.o. female with medical history significant for cervical cord myelomalacia with bedbound status, asthma, HTN, polyarteritis nodosa, depression/anxiety, chronic pain on chronic opioid therapy, fibromyalgia, morbid obesity, [name] who is admitted with acute respiratory failure with hypoxia.

## 2022-11-20 NOTE — H&P (Signed)
History and Physical    [name]:[id number] DOB: 1970-11-29 DOA: 11/20/2022  PCP: Sheliah Hatch, MD  Patient coming from: Home  I have personally briefly reviewed patient's old medical records in [facility]  Chief Complaint: Shortness of breath  HPI: [name] is a 52 y.o. female with medical history significant for cervical cord myelomalacia with bedbound status, asthma, HTN, polyarteritis nodosa, depression/anxiety, chronic pain on chronic opioid therapy, fibromyalgia, morbid obesity, [name] on CPAP who presented to the ED for evaluation of shortness of breath.  Patient reports developing new shortness of breath yesterday.  Dyspnea has been fairly constant but worse when laying flat.  She has had an associated cough productive of clear sputum.  She denies fevers, chills, diaphoresis.  She has had some swelling in her lower extremities.  She says that she was started on antibiotics due to concern for cellulitis of the left lower leg and swelling has since improved.    EMS were called to her home.  She was noted to be hypoxic with SpO2 84% while on room air.  She was placed on nonrebreather and brought to the ED for further evaluation.  She denies nausea, vomit, abdominal pain, dysuria.  She is mostly bedbound.  She will use a rolling walker sometimes in her home to ambulate.  She lives with her father and her son.  ED Course  Labs/Imaging on admission: I have personally reviewed following labs and imaging studies.  Initial vitals showed BP 146/86, pulse 90, RR 20, temp 98.1 F, SpO2 100% on 4 L O2 via [place].  Labs showed WBC 13.3, hemoglobin 13.7, platelets 309,000, sodium 133, potassium 4.3, bicarb 27, BUN 10, creatinine 0.73, serum glucose 100, LFTs within normal limits, BNP 34.5, troponin 10.  2 view chest x-ray showed cardiomegaly with vascular congestion and mild pulmonary edema.  Elevation of the right hemidiaphragm with right basilar atelectasis.  Patient was  given IV Lasix 40 mg.  The hospitalist service was consulted to admit for further evaluation and management.  Review of Systems: All systems reviewed and are negative except as documented in history of present illness above.   Past Medical History:  Diagnosis Date   Allergic rhinitis    Ankle fracture, right    Anxiety    ASCUS of cervix with negative high risk HPV 05/2018   Carpal tunnel syndrome    Cervical cancer (HCC) 1998   Fibromyalgia    Hyperlipidemia    Hypertension    Migraine headache    Obesity    Sleep apnea     Past Surgical History:  Procedure Laterality Date   ABDOMINAL HYSTERECTOMY  10/1996   ABDOMINAL SURGERY  jan/11/2012   gastric bypass surgery   CARPAL TUNNEL RELEASE     bilateral    CESAREAN SECTION     CHOLECYSTECTOMY  07/1992   ESOPHAGOGASTRODUODENOSCOPY N/A 01/14/2016   Procedure: ESOPHAGOGASTRODUODENOSCOPY (EGD);  Surgeon: Napoleon Form, MD;  Location: [facility] ENDOSCOPY;  Service: Endoscopy;  Laterality: N/A;    Social History:  reports that she has never smoked. She has never used smokeless tobacco. She reports that she does not drink alcohol and does not use drugs.  Allergies  Allergen Reactions   Niacin Anaphylaxis, Shortness Of Breath and Swelling    Swelling and "problems breathing"   Sulfamethoxazole-Trimethoprim Anaphylaxis, Swelling, Rash and Other (See Comments)    Throat closed and the eyes became swollen   Aspirin Hives   Bactrim Hives   Benzoin Compound  Rash   Cephalexin Hives    Tolerated ceftriaxone and cefepime 07/2019   Iohexol Hives    Pt treated with PO benedryl   Lisinopril Hives   Naproxen Hives   Pnu-Imune [Pneumococcal Polysaccharide Vaccine] Rash   Sulfonamide Derivatives Rash    Family History  Problem Relation Age of Onset   Diabetes Mother    Emphysema Mother    Allergies Mother    Hypertension Mother    Heart disease Father    Allergies Father    Prostate cancer Father    Breast cancer Maternal  Grandmother 65     Prior to Admission medications   Medication Sig Start Date End Date Taking? Authorizing Provider  albuterol (VENTOLIN HFA) 108 (90 Base) MCG/ACT inhaler Inhale 2 puffs into the lungs every 6 (six) hours as needed for wheezing or shortness of breath. 07/05/22   Sheliah Hatch, MD  ALPRAZolam Prudy Feeler) 0.5 MG tablet Take 1 tablet (0.5 mg total) by mouth 2 (two) times daily. 10/20/22   Sheliah Hatch, MD  ARIPiprazole (ABILIFY) 15 MG tablet TAKE 1 TABLET BY MOUTH EVERY DAY 07/29/22   Sheliah Hatch, MD  Armodafinil 250 MG tablet Take 1 tablet (250 mg total) by mouth in the morning and at bedtime. 08/10/22   Sheliah Hatch, MD  Biotin 10 MG TABS Take 1 tablet (10 mg total) by mouth in the morning. 08/10/22   Sheliah Hatch, MD  buPROPion (WELLBUTRIN XL) 300 MG 24 hr tablet TAKE 1 TABLET BY MOUTH EVERY DAY 09/30/22   Sheliah Hatch, MD  calcium citrate (CALCITRATE - DOSED IN MG ELEMENTAL CALCIUM) 950 (200 Ca) MG tablet Take 1 tablet (200 mg of elemental calcium total) by mouth daily. 10/25/22   Sheliah Hatch, MD  clonazePAM (KLONOPIN) 1 MG tablet TAKE 1 TABLET BY MOUTH AT BEDTIME 07/20/22   Sheliah Hatch, MD  cyanocobalamin (VITAMIN B12) 1000 MCG tablet TAKE 1 TABLET BY MOUTH EVERY DAY 09/30/22   Sheliah Hatch, MD  cyclobenzaprine (FLEXERIL) 10 MG tablet TAKE 2 TABLETS BY MOUTH 2 TIMES DAILY 09/30/22   Sheliah Hatch, MD  diclofenac Sodium (VOLTAREN) 1 % GEL Apply 4 g topically 4 (four) times daily. 08/10/22   Sheliah Hatch, MD  dicloxacillin (DYNAPEN) 250 MG capsule Take 1 capsule (250 mg total) by mouth 4 (four) times daily for 7 days. 11/15/22 11/22/22  Crain, Alphonzo Lemmings L, PA  DULoxetine (CYMBALTA) 60 MG capsule Take 2 capsules (120 mg total) by mouth daily. Patient taking differently: Take 60 mg by mouth 2 (two) times daily. 06/22/15   Sheliah Hatch, MD  FEROSUL 325 (65 Fe) MG tablet TAKE 1 TABLET BY MOUTH EVERY MORNING WITH BREAKFAST  09/30/22   Sheliah Hatch, MD  fluticasone-salmeterol (ADVAIR DISKUS) 100-50 MCG/ACT AEPB Inhale 1 puff into the lungs 2 (two) times daily. 10/27/22   Luciano Cutter, MD  folic acid (FOLVITE) 1 MG tablet TAKE 1 TABLET BY MOUTH EVERY DAY 09/30/22   Sheliah Hatch, MD  gabapentin (NEURONTIN) 600 MG tablet Take 1 tablet (600 mg total) by mouth 3 (three) times daily. TAKE 2 TABLETS BY MOUTH 3 TIMES DAILY Patient taking differently: Take 600 mg by mouth 3 (three) times daily. 07/09/20   Sheliah Hatch, MD  GNP VITAMIN B-1 100 MG tablet TAKE 1 TABLET BY MOUTH EVERY DAY 09/30/22   Sheliah Hatch, MD  HYDROcodone-acetaminophen (NORCO) 10-325 MG tablet TAKE 1 TABLET BY MOUTH EVERY 8 HOURS  AS NEEDED 11/03/22   Sheliah Hatch, MD  nebivolol (BYSTOLIC) 10 MG tablet TAKE 1 TABLET BY MOUTH EVERY DAY 09/30/22   Sheliah Hatch, MD  nystatin (MYCOSTATIN/NYSTOP) powder APPLY 1 APPLICATION TOPICALLY 3 TIMES DAILY 08/17/22   Sheliah Hatch, MD  Oxcarbazepine (TRILEPTAL) 300 MG tablet Take 300 mg by mouth 2 (two) times daily.    [provider]  pantoprazole (PROTONIX) 40 MG tablet TAKE 1 TABLET BY MOUTH 2 TIMES DAILY 09/30/22   Sheliah Hatch, MD  phentermine 37.5 MG capsule Take 1 capsule (37.5 mg total) by mouth every morning. 06/30/22   Sheliah Hatch, MD  promethazine (PHENERGAN) 25 MG tablet TAKE 1 TABLET BY MOUTH EVERY 6 HOURS AS NEEDED FOR NAUSEA AND VOMITING 08/17/22   Sheliah Hatch, MD  pyridOXINE (VITAMIN B6) 100 MG tablet TAKE 1 TABLET BY MOUTH EVERY DAY 09/30/22   Sheliah Hatch, MD    Physical Exam: Vitals:   11/20/22 1849 11/20/22 1858 11/20/22 1914  BP:  (!) 146/86   Pulse:  88   Resp:  20   Temp:  98.1 F (36.7 C)   TempSrc:  Oral   SpO2: 99% 100% 100%   Constitutional: Morbidly obese woman resting in bed with head elevated, NAD, calm, comfortable Eyes: EOMI, lids and conjunctivae normal ENMT: Mucous membranes are moist. Posterior pharynx  clear of any exudate or lesions.Normal dentition.  Neck: normal, supple, no masses. Respiratory: Exam limited due to body habitus, distant breath sounds clear to auscultation. Normal respiratory effort while on 2 L O2 via [place]. No accessory muscle use.  Cardiovascular: Regular rate and rhythm, no murmurs / rubs / gallops.  Obese lower extremities with trace bilateral lower extremity edema. 2+ pedal pulses. Abdomen: no tenderness, no masses palpated.  Musculoskeletal: no clubbing / cyanosis. No joint deformity upper and lower extremities. Good ROM, no contractures. Normal muscle tone.  Skin: Venous stasis changes lower extremities Neurologic: Sensation intact. Strength equal bilaterally. Psychiatric: Normal judgment and insight. Alert and oriented x 3. Normal mood.   EKG: Personally reviewed. Sinus rhythm, rate 89, no acute ischemic changes.  Rate is slower when compared to prior.  Assessment/Plan Principal Problem:   Acute respiratory failure with hypoxia (HCC) Active Problems:   Anxiety and depression   Cellulitis   [name] (obstructive sleep apnea)   Myelomalacia of cervical cord (HCC)   [name] is a 52 y.o. female with medical history significant for cervical cord myelomalacia with bedbound status, asthma, HTN, polyarteritis nodosa, depression/anxiety, chronic pain on chronic opioid therapy, fibromyalgia, morbid obesity, [name] who is admitted with acute respiratory failure with hypoxia.  Assessment and Plan: Acute respiratory failure with hypoxia: SpO2 84% on RA PTA.  She has been weaned down to 2 L O2 via [place].  Does not use supplemental O2 at baseline.  CXR shows cardiomegaly with pulmonary edema.  Suspicion is for underlying likely diastolic CHF.  Previous echocardiograms have been limited due to body habitus. -IV Lasix 40 mg twice daily -Obtain echocardiogram -Strict I/O's and daily weights -Continue supplemental O2 and wean off as able  Asthma: No obvious wheezing on admission.   Continue Dulera and albuterol as needed.  Cellulitis of left lower extremity: Started on antibiotics as an outpatient, significantly improving per patient.  Continue dicloxacillin.  Depression/anxiety: Mood stable.  On multiple medications. -Continue Abilify, bupropion, Cymbalta, Trileptal -Continue home Xanax 0.5 mg BID prn -Also on Klonopin 1 mg at bedtime for RLS/sleep  Chronic pain syndrome:  Continue gabapentin and home Norco as needed.  [name]: Continue CPAP nightly.  Appears to also be on armodafinil for somnolence, will continue.  Morbid obesity: Estimated body mass index is 54.61 kg/m as calculated from the following:   Height as of 10/27/22: 5\' 1"  (1.549 m).   Weight as of 10/27/22: 131.1 kg.   Cervical cord myelomalacia: Chronic issue with diminished mobility.  Continue gabapentin.   DVT prophylaxis: enoxaparin (LOVENOX) injection 40 mg Start: 11/21/22 1000 Code Status: DNR, confirmed with patient on admission Family Communication: Discussed with patient, she has discussed with family Disposition Plan: From home, dispo pending clinical progress Consults called: None Severity of Illness: The appropriate patient status for this patient is OBSERVATION. Observation status is judged to be reasonable and necessary in order to provide the required intensity of service to ensure the patient's safety. The patient's presenting symptoms, physical exam findings, and initial radiographic and laboratory data in the context of their medical condition is felt to place them at decreased risk for further clinical deterioration. Furthermore, it is anticipated that the patient will be medically stable for discharge from the hospital within 2 midnights of admission.   Darreld Mclean MD [facility]  If 7PM-7AM, please contact night-coverage [email or web address]  11/20/2022, 10:57 PM

## 2022-11-20 NOTE — ED Provider Notes (Signed)
[place] EMERGENCY DEPARTMENT AT [facility] Provider Note   CSN: [id number] Arrival date & time: 11/20/22  1839     History  Chief Complaint  Patient presents with   Shortness of Breath    [name] is a 52 y.o. female.  HPI 52 year old female presents with acute shortness of breath.  Started around 5 PM.  She was diagnosed with a UTI after having some lower abdominal pain 2 days ago.  Was put on antibiotics.  Those symptoms seem to be better and she has no further abdominal pain.  However, today she felt all of a sudden short of breath when she laid flat.  EMS found her to have O2 sats in the 84% range.  Was put on a nonrebreather.  Feels like her symptoms have completely resolved at this point.  She feels like she has a little bit of a nonproductive cough but no fever, chest pain, leg swelling.  She has had pneumonia with similar symptoms in the past.  Home Medications Prior to Admission medications   Medication Sig Start Date End Date Taking? Authorizing Provider  albuterol (VENTOLIN HFA) 108 (90 Base) MCG/ACT inhaler Inhale 2 puffs into the lungs every 6 (six) hours as needed for wheezing or shortness of breath. 07/05/22   Sheliah Hatch, MD  ALPRAZolam Prudy Feeler) 0.5 MG tablet Take 1 tablet (0.5 mg total) by mouth 2 (two) times daily. 10/20/22   Sheliah Hatch, MD  ARIPiprazole (ABILIFY) 15 MG tablet TAKE 1 TABLET BY MOUTH EVERY DAY 07/29/22   Sheliah Hatch, MD  Armodafinil 250 MG tablet Take 1 tablet (250 mg total) by mouth in the morning and at bedtime. 08/10/22   Sheliah Hatch, MD  Biotin 10 MG TABS Take 1 tablet (10 mg total) by mouth in the morning. 08/10/22   Sheliah Hatch, MD  buPROPion (WELLBUTRIN XL) 300 MG 24 hr tablet TAKE 1 TABLET BY MOUTH EVERY DAY 09/30/22   Sheliah Hatch, MD  calcium citrate (CALCITRATE - DOSED IN MG ELEMENTAL CALCIUM) 950 (200 Ca) MG tablet Take 1 tablet (200 mg of elemental calcium total) by mouth daily.  10/25/22   Sheliah Hatch, MD  clonazePAM (KLONOPIN) 1 MG tablet TAKE 1 TABLET BY MOUTH AT BEDTIME 07/20/22   Sheliah Hatch, MD  cyanocobalamin (VITAMIN B12) 1000 MCG tablet TAKE 1 TABLET BY MOUTH EVERY DAY 09/30/22   Sheliah Hatch, MD  cyclobenzaprine (FLEXERIL) 10 MG tablet TAKE 2 TABLETS BY MOUTH 2 TIMES DAILY 09/30/22   Sheliah Hatch, MD  diclofenac Sodium (VOLTAREN) 1 % GEL Apply 4 g topically 4 (four) times daily. 08/10/22   Sheliah Hatch, MD  dicloxacillin (DYNAPEN) 250 MG capsule Take 1 capsule (250 mg total) by mouth 4 (four) times daily for 7 days. 11/15/22 11/22/22  Crain, Alphonzo Lemmings L, PA  DULoxetine (CYMBALTA) 60 MG capsule Take 2 capsules (120 mg total) by mouth daily. Patient taking differently: Take 60 mg by mouth 2 (two) times daily. 06/22/15   Sheliah Hatch, MD  FEROSUL 325 (65 Fe) MG tablet TAKE 1 TABLET BY MOUTH EVERY MORNING WITH BREAKFAST 09/30/22   Sheliah Hatch, MD  fluticasone-salmeterol (ADVAIR DISKUS) 100-50 MCG/ACT AEPB Inhale 1 puff into the lungs 2 (two) times daily. 10/27/22   Luciano Cutter, MD  folic acid (FOLVITE) 1 MG tablet TAKE 1 TABLET BY MOUTH EVERY DAY 09/30/22   Sheliah Hatch, MD  gabapentin (NEURONTIN) 600 MG tablet Take  1 tablet (600 mg total) by mouth 3 (three) times daily. TAKE 2 TABLETS BY MOUTH 3 TIMES DAILY Patient taking differently: Take 600 mg by mouth 3 (three) times daily. 07/09/20   Sheliah Hatch, MD  GNP VITAMIN B-1 100 MG tablet TAKE 1 TABLET BY MOUTH EVERY DAY 09/30/22   Sheliah Hatch, MD  HYDROcodone-acetaminophen (NORCO) 10-325 MG tablet TAKE 1 TABLET BY MOUTH EVERY 8 HOURS AS NEEDED 11/03/22   Sheliah Hatch, MD  nebivolol (BYSTOLIC) 10 MG tablet TAKE 1 TABLET BY MOUTH EVERY DAY 09/30/22   Sheliah Hatch, MD  nystatin (MYCOSTATIN/NYSTOP) powder APPLY 1 APPLICATION TOPICALLY 3 TIMES DAILY 08/17/22   Sheliah Hatch, MD  Oxcarbazepine (TRILEPTAL) 300 MG tablet Take 300 mg by mouth 2 (two)  times daily.    [provider]  pantoprazole (PROTONIX) 40 MG tablet TAKE 1 TABLET BY MOUTH 2 TIMES DAILY 09/30/22   Sheliah Hatch, MD  phentermine 37.5 MG capsule Take 1 capsule (37.5 mg total) by mouth every morning. 06/30/22   Sheliah Hatch, MD  promethazine (PHENERGAN) 25 MG tablet TAKE 1 TABLET BY MOUTH EVERY 6 HOURS AS NEEDED FOR NAUSEA AND VOMITING 08/17/22   Sheliah Hatch, MD  pyridOXINE (VITAMIN B6) 100 MG tablet TAKE 1 TABLET BY MOUTH EVERY DAY 09/30/22   Sheliah Hatch, MD      Allergies    Niacin, Sulfamethoxazole-trimethoprim, Aspirin, Bactrim, Benzoin compound, Cephalexin, Iohexol, Lisinopril, Naproxen, Pnu-imune [pneumococcal polysaccharide vaccine], and Sulfonamide derivatives    Review of Systems   Review of Systems  Constitutional:  Negative for fever.  Respiratory:  Positive for cough and shortness of breath.   Cardiovascular:  Negative for chest pain and leg swelling.  Gastrointestinal:  Negative for abdominal pain.  Genitourinary:  Negative for dysuria.    Physical Exam Updated Vital Signs BP (!) 146/86 (BP Location: Right Arm)   Pulse 88   Temp 98.1 F (36.7 C) (Oral)   Resp 20   SpO2 100%  Physical Exam Vitals and nursing note reviewed.  Constitutional:      Appearance: She is well-developed. She is obese.  HENT:     Head: Normocephalic and atraumatic.  Cardiovascular:     Rate and Rhythm: Normal rate and regular rhythm.     Heart sounds: Normal heart sounds.  Pulmonary:     Effort: Pulmonary effort is normal.     Breath sounds: Normal breath sounds. No wheezing.     Comments: Perhaps mild rales at bases Abdominal:     Palpations: Abdomen is soft.     Tenderness: There is no abdominal tenderness.  Musculoskeletal:     Comments: No significant pitting edema to BLE.  Skin:    General: Skin is warm and dry.  Neurological:     Mental Status: She is alert.     ED Results / Procedures / Treatments   Labs (all labs  ordered are listed, but only abnormal results are displayed) Labs Reviewed  COMPREHENSIVE METABOLIC PANEL - Abnormal; Notable for the following components:      Result Value   Sodium 133 (*)    Chloride 95 (*)    Glucose, Bld 100 (*)    Calcium 8.6 (*)    Albumin 3.0 (*)    All other components within normal limits  CBC WITH DIFFERENTIAL/PLATELET - Abnormal; Notable for the following components:   WBC 13.3 (*)    Neutro Abs 10.8 (*)    All other components within  normal limits  SARS CORONAVIRUS 2 BY RT PCR  BRAIN NATRIURETIC PEPTIDE  TROPONIN I (HIGH SENSITIVITY)  TROPONIN I (HIGH SENSITIVITY)    EKG EKG Interpretation Date/Time:  Sunday November 20 2022 19:01:26 EDT Ventricular Rate:  89 PR Interval:  157 QRS Duration:  99 QT Interval:  367 QTC Calculation: 447 R Axis:   54  Text Interpretation: Sinus rhythm Consider left atrial enlargement no acute ST/T changes Confirmed by Pricilla Loveless [phone]) on 11/20/2022 7:52:46 PM  Radiology DG Chest 2 View  Result Date: 11/20/2022 CLINICAL DATA:  Shortness of breath EXAM: CHEST - 2 VIEW COMPARISON:  04/21/2022 FINDINGS: There is cardiomegaly with vascular congestion. Low lung volumes. Stable elevation of the right hemidiaphragm. Diffuse interstitial prominence with perihilar opacities, likely mild edema. Right base atelectasis. No effusions or acute bony abnormality. IMPRESSION: Cardiomegaly with vascular congestion and mild pulmonary edema. Elevation of the right hemidiaphragm.  Right base atelectasis. Electronically Signed   By: Charlett Nose M.D.   On: 11/20/2022 20:00    Procedures Procedures    Medications Ordered in ED Medications  clonazePAM (KLONOPIN) tablet 0.5 mg (0.5 mg Oral Given 11/20/22 2115)  furosemide (LASIX) injection 40 mg (40 mg Intravenous Given 11/20/22 2115)    ED Course/ Medical Decision Making/ A&P                             Medical Decision Making Amount and/or Complexity of Data Reviewed Labs: ordered.     Details: Normal BNP.  Mild leukocytosis. Radiology: ordered.    Details: Pulmonary edema ECG/medicine tests: ordered and independent interpretation performed.    Details: No no ischemia.  Risk Prescription drug management. Decision regarding hospitalization.   Patient with acute onset of shortness of breath.  Started when lying flat and her x-ray is concerning for pulmonary edema though somewhat limited based on her body habitus.  However she has had similar presentations before and required diuresis and I think the same will be required today.  Will give a dose of Lasix.  She came in on a nonrebreather but has been weaned down to about 2 L of oxygen.  Still satting in the mid 90s.  Not in distress but I think in the setting of both diagnosis and treatment it would be reasonable to admit for further workup.  Discussed with Dr. Allena Katz.  Given the chest x-ray findings I think pneumonia, PE, etc. is less likely.        Final Clinical Impression(s) / ED Diagnoses Final diagnoses:  Acute respiratory failure with hypoxia [facility])    Rx / DC Orders ED Discharge Orders     None         Pricilla Loveless, MD 11/20/22 2127

## 2022-11-21 ENCOUNTER — Observation Stay (HOSPITAL_BASED_OUTPATIENT_CLINIC_OR_DEPARTMENT_OTHER): Payer: 59

## 2022-11-21 DIAGNOSIS — R0902 Hypoxemia: Secondary | ICD-10-CM | POA: Diagnosis not present

## 2022-11-21 DIAGNOSIS — Z1152 Encounter for screening for COVID-19: Secondary | ICD-10-CM | POA: Diagnosis not present

## 2022-11-21 DIAGNOSIS — Z9981 Dependence on supplemental oxygen: Secondary | ICD-10-CM | POA: Diagnosis not present

## 2022-11-21 DIAGNOSIS — Z743 Need for continuous supervision: Secondary | ICD-10-CM | POA: Diagnosis not present

## 2022-11-21 DIAGNOSIS — G4733 Obstructive sleep apnea (adult) (pediatric): Secondary | ICD-10-CM | POA: Diagnosis present

## 2022-11-21 DIAGNOSIS — E785 Hyperlipidemia, unspecified: Secondary | ICD-10-CM | POA: Diagnosis present

## 2022-11-21 DIAGNOSIS — J811 Chronic pulmonary edema: Secondary | ICD-10-CM | POA: Diagnosis not present

## 2022-11-21 DIAGNOSIS — J9690 Respiratory failure, unspecified, unspecified whether with hypoxia or hypercapnia: Secondary | ICD-10-CM | POA: Diagnosis not present

## 2022-11-21 DIAGNOSIS — Z9884 Bariatric surgery status: Secondary | ICD-10-CM | POA: Diagnosis not present

## 2022-11-21 DIAGNOSIS — I110 Hypertensive heart disease with heart failure: Secondary | ICD-10-CM | POA: Diagnosis present

## 2022-11-21 DIAGNOSIS — I5033 Acute on chronic diastolic (congestive) heart failure: Secondary | ICD-10-CM | POA: Diagnosis present

## 2022-11-21 DIAGNOSIS — J9601 Acute respiratory failure with hypoxia: Secondary | ICD-10-CM | POA: Diagnosis not present

## 2022-11-21 DIAGNOSIS — J45909 Unspecified asthma, uncomplicated: Secondary | ICD-10-CM | POA: Diagnosis present

## 2022-11-21 DIAGNOSIS — G894 Chronic pain syndrome: Secondary | ICD-10-CM | POA: Diagnosis present

## 2022-11-21 DIAGNOSIS — Z6841 Body Mass Index (BMI) 40.0 and over, adult: Secondary | ICD-10-CM | POA: Diagnosis not present

## 2022-11-21 DIAGNOSIS — G9589 Other specified diseases of spinal cord: Secondary | ICD-10-CM | POA: Diagnosis present

## 2022-11-21 DIAGNOSIS — F32A Depression, unspecified: Secondary | ICD-10-CM | POA: Diagnosis present

## 2022-11-21 DIAGNOSIS — Z66 Do not resuscitate: Secondary | ICD-10-CM | POA: Diagnosis present

## 2022-11-21 DIAGNOSIS — R0602 Shortness of breath: Secondary | ICD-10-CM | POA: Diagnosis present

## 2022-11-21 DIAGNOSIS — L89312 Pressure ulcer of right buttock, stage 2: Secondary | ICD-10-CM | POA: Diagnosis present

## 2022-11-21 DIAGNOSIS — R918 Other nonspecific abnormal finding of lung field: Secondary | ICD-10-CM | POA: Diagnosis not present

## 2022-11-21 DIAGNOSIS — Z7401 Bed confinement status: Secondary | ICD-10-CM | POA: Diagnosis not present

## 2022-11-21 DIAGNOSIS — Z79899 Other long term (current) drug therapy: Secondary | ICD-10-CM | POA: Diagnosis not present

## 2022-11-21 DIAGNOSIS — N390 Urinary tract infection, site not specified: Secondary | ICD-10-CM | POA: Diagnosis present

## 2022-11-21 DIAGNOSIS — F419 Anxiety disorder, unspecified: Secondary | ICD-10-CM | POA: Diagnosis present

## 2022-11-21 DIAGNOSIS — R0989 Other specified symptoms and signs involving the circulatory and respiratory systems: Secondary | ICD-10-CM | POA: Diagnosis not present

## 2022-11-21 DIAGNOSIS — L03116 Cellulitis of left lower limb: Secondary | ICD-10-CM | POA: Diagnosis present

## 2022-11-21 DIAGNOSIS — M797 Fibromyalgia: Secondary | ICD-10-CM | POA: Diagnosis present

## 2022-11-21 DIAGNOSIS — I517 Cardiomegaly: Secondary | ICD-10-CM | POA: Diagnosis not present

## 2022-11-21 DIAGNOSIS — J9811 Atelectasis: Secondary | ICD-10-CM | POA: Diagnosis not present

## 2022-11-21 DIAGNOSIS — R9389 Abnormal findings on diagnostic imaging of other specified body structures: Secondary | ICD-10-CM | POA: Diagnosis not present

## 2022-11-21 DIAGNOSIS — Z8541 Personal history of malignant neoplasm of cervix uteri: Secondary | ICD-10-CM | POA: Diagnosis not present

## 2022-11-21 LAB — CBC
HCT: 45 % (ref 36.0–46.0)
Hemoglobin: 13.7 g/dL (ref 12.0–15.0)
MCH: 28.5 pg (ref 26.0–34.0)
MCHC: 30.4 g/dL (ref 30.0–36.0)
MCV: 93.6 fL (ref 80.0–100.0)
Platelets: 343 10*3/uL (ref 150–400)
RBC: 4.81 MIL/uL (ref 3.87–5.11)
RDW: 13.9 % (ref 11.5–15.5)
WBC: 18.6 10*3/uL — ABNORMAL HIGH (ref 4.0–10.5)
nRBC: 0 % (ref 0.0–0.2)

## 2022-11-21 LAB — BASIC METABOLIC PANEL
Anion gap: 11 (ref 5–15)
BUN: 8 mg/dL (ref 6–20)
CO2: 28 mmol/L (ref 22–32)
Calcium: 8.7 mg/dL — ABNORMAL LOW (ref 8.9–10.3)
Chloride: 95 mmol/L — ABNORMAL LOW (ref 98–111)
Creatinine, Ser: 0.69 mg/dL (ref 0.44–1.00)
GFR, Estimated: >60 mL/min (ref >60)
Glucose, Bld: 108 mg/dL — ABNORMAL HIGH (ref 70–99)
Potassium: 4 mmol/L (ref 3.5–5.1)
Sodium: 134 mmol/L — ABNORMAL LOW (ref 135–145)

## 2022-11-21 LAB — MAGNESIUM: Magnesium: 1.7 mg/dL (ref 1.7–2.4)

## 2022-11-21 LAB — HIV ANTIBODY (ROUTINE TESTING W REFLEX): HIV Screen 4th Generation wRfx: NONREACTIVE

## 2022-11-21 LAB — ECHOCARDIOGRAM COMPLETE: S' Lateral: 3.9 cm

## 2022-11-21 LAB — TROPONIN I (HIGH SENSITIVITY): Troponin I (High Sensitivity): 11 ng/L (ref <18)

## 2022-11-21 LAB — PROCALCITONIN: Procalcitonin: <0.1 ng/mL

## 2022-11-21 MED ORDER — MODAFINIL 100 MG PO TABS
200.0000 mg | ORAL_TABLET | Freq: Every day | ORAL | Status: DC
  Administered 2022-11-21 – 2022-11-24 (×4): 200 mg via ORAL
  Filled 2022-11-21 (×3): qty 2

## 2022-11-21 MED ORDER — PERFLUTREN LIPID MICROSPHERE
1.0000 mL | INTRAVENOUS | Status: AC | PRN
  Administered 2022-11-21: 5 mL via INTRAVENOUS

## 2022-11-21 NOTE — Progress Notes (Signed)
   11/21/22 2342  BiPAP/CPAP/SIPAP  $ Non-Invasive Home Ventilator  Initial  $ Face Mask Small Yes  BiPAP/CPAP/SIPAP Pt Type Adult  BiPAP/CPAP/SIPAP DREAMSTATIOND  Mask Type Full face mask  Mask Size Medium  Patient Home Equipment No  Auto Titrate Yes (Min 4, Max 20)  CPAP/SIPAP surface wiped down Yes  BiPAP/CPAP /SiPAP Vitals  Temp 98.6 F (37 C)  Pulse Rate 83  Resp 19  BP 101/63  SpO2 95 %  MEWS Score/Color  MEWS Score 0  MEWS Score Color Green   Pt resting comfortably on bipap w/no distress.  RT will continue to monitor

## 2022-11-21 NOTE — Progress Notes (Addendum)
Progress Note   Patient: [name]:[id number] DOB: 05-19-1970 DOA: 11/20/2022     0 DOS: the patient was seen and examined on 11/21/2022   Brief hospital course: [name] is a 52 y.o. female with medical history significant for cervical cord myelomalacia with bedbound status, asthma, HTN, polyarteritis nodosa, depression/anxiety, chronic pain on chronic opioid therapy, fibromyalgia, morbid obesity, [name] who is admitted with acute respiratory failure with hypoxia.  Patient is currently on 3 L of oxygen via nasal cannula.  Currently undergoing diuresis reporting some improvement in shortness of breath.  Assessment and Plan: Acute respiratory failure with hypoxia: SpO2 84% on RA PTA.  She has been weaned down to 2 L O2 via [place].  Does not use supplemental O2 at baseline.  CXR shows cardiomegaly with pulmonary edema.  Suspicion is for underlying likely diastolic CHF.  Previous echocardiograms have been limited due to body habitus.  -Obtain echocardiogram, done technically difficult study due to body habitus.  However EF is preserved, could not visualize inferior vena cava to assess fluid status.  BNP is within normal limits but this can be normal in obese individuals. -IV Lasix 40 mg twice daily to be continued. -Strict I/O's and daily weights -Continue supplemental O2 and wean off as able -If patient continues to be hypoxic unable to wean oxygen despite diuresis will consider getting CT of the chest with contrast.   Asthma: No obvious wheezing on admission.  Continue Dulera and albuterol as needed.   Cellulitis of left lower extremity: Started on antibiotics as an outpatient, significantly improving per patient.  Continue dicloxacillin. Clinically seems to be improving, however leukocytosis has worsened. Check procalcitonin.   Depression/anxiety: Mood stable.  On multiple medications. -Continue Abilify, bupropion, Cymbalta, Trileptal -Continue home Xanax 0.5 mg BID prn -Also on  Klonopin 1 mg at bedtime for RLS/sleep   Chronic pain syndrome: Continue gabapentin and home Norco as needed.   [name]: Continue CPAP nightly.  Appears to also be on armodafinil for somnolence, will continue.   Morbid obesity: Estimated body mass index is 54.61 kg/m as calculated from the following:   Height as of 10/27/22: 5\' 1"  (1.549 m).   Weight as of 10/27/22: 131.1 kg.    Cervical cord myelomalacia: Chronic issue with diminished mobility.  Continue gabapentin.   DVT prophylaxis: enoxaparin (LOVENOX) injection 40 mg Start: 11/21/22 1000      Subjective: Was seen and examined at bedside today.  Patient reports improvement in shortness of breath denies having any chest pain palpitations.  Physical Exam: Vitals:   11/21/22 1100 11/21/22 1130 11/21/22 1200 11/21/22 1230  BP: (!) 136/118 125/76 (!) 176/92 (!) 152/75  Pulse: 91 92 96 92  Resp: 19 15 19 20   Temp:      TempSrc:      SpO2: 100% 100% 93% 96%   Constitutional: Morbidly obese woman resting in bed with head elevated, NAD, calm, comfortable Eyes: EOMI, lids and conjunctivae normal ENMT: Mucous membranes are moist. Posterior pharynx clear of any exudate or lesions.Normal dentition.  Neck: normal, supple, no masses. Respiratory: Exam limited due to body habitus, distant breath sounds clear to auscultation. Normal respiratory effort while on 2 L O2 via [place]. No accessory muscle use.  Cardiovascular: Regular rate and rhythm, no murmurs / rubs / gallops.  Obese lower extremities with trace bilateral lower extremity edema. 2+ pedal pulses. Abdomen: no tenderness, no masses palpated.  Musculoskeletal: no clubbing / cyanosis. No joint deformity upper and lower extremities. Good ROM,  no contractures. Normal muscle tone.  Skin: Venous stasis changes lower extremities Neurologic: Sensation intact. Strength equal bilaterally. Psychiatric: Normal judgment and insight. Alert and oriented x 3. Normal mood.  Data Reviewed:  BMP  reviewed  Family Communication: Patient is alert and oriented  Disposition: Status is: Observation The patient will require care spanning > 2 midnights and should be moved to inpatient because: Shortness of breath and oxygen dependent  Planned Discharge Destination: Home    Time spent: 35 minutes  Author: Harold Hedge, MD 11/21/2022 1:23 PM  For on call review [email or web address].

## 2022-11-21 NOTE — ED Notes (Addendum)
Pt transported to ECHO/stress test.

## 2022-11-21 NOTE — ED Notes (Signed)
ED TO INPATIENT HANDOFF REPORT  ED Nurse Name and Phone #: Lenis Dickinson [phone]  S Name/Age/Gender [name] 52 y.o. female Room/Bed: 011C/011C  Code Status   Code Status: DNR  Home/SNF/Other Home Patient oriented to: self, place, time, and situation Is this baseline? Yes   Triage Complete: Triage complete  Chief Complaint Acute respiratory failure with hypoxia (HCC) [J96.01]  Triage Note Pt BIB [facility] from home for c/o [facility] that started about an 1700. EMS reports pt was 84 % on room air upon their arrival. Place on nonrebreather at 15L, currently satting at 100%. Pt was diagnosed with a UTI on Friday and started on antibiotics. Denies chest pain.   BP 144/98 HR 94 CBG 109 RR 24   Allergies Allergies  Allergen Reactions   Niacin Anaphylaxis, Shortness Of Breath and Swelling    Swelling and "problems breathing"   Sulfamethoxazole-Trimethoprim Anaphylaxis, Swelling, Rash and Other (See Comments)    Throat closed and the eyes became swollen   Aspirin Hives   Bactrim Hives   Benzoin Compound Rash   Cephalexin Hives    Tolerated ceftriaxone and cefepime 07/2019   Iohexol Hives    Pt treated with PO benedryl   Lisinopril Hives   Naproxen Hives   Pnu-Imune [Pneumococcal Polysaccharide Vaccine] Rash   Sulfonamide Derivatives Rash    Level of Care/Admitting Diagnosis ED Disposition     ED Disposition  Admit   Condition  --   Comment  Hospital Area: [facility] [[id number]]  Level of Care: Telemetry Cardiac [103]  May admit patient to [facility] or [facility] if equivalent level of care is available:: No  Covid Evaluation: Symptomatic Person Under Investigation (PUI) or recent exposure (last 10 days) *Testing Required*  Diagnosis: Acute respiratory failure with hypoxia [facility]) [[id number]]  Admitting Physician: Charlsie Quest [[id number]]  Attending Physician: Harold Hedge [[id number]]  Certification:: I certify this patient will need inpatient services  for at least 2 midnights  Estimated Length of Stay: 2          B Medical/Surgery History Past Medical History:  Diagnosis Date   Allergic rhinitis    Ankle fracture, right    Anxiety    ASCUS of cervix with negative high risk HPV 05/2018   Carpal tunnel syndrome    Cervical cancer (HCC) 1998   Fibromyalgia    Hyperlipidemia    Hypertension    Migraine headache    Obesity    Sleep apnea    Past Surgical History:  Procedure Laterality Date   ABDOMINAL HYSTERECTOMY  10/1996   ABDOMINAL SURGERY  jan/11/2012   gastric bypass surgery   CARPAL TUNNEL RELEASE     bilateral    CESAREAN SECTION     CHOLECYSTECTOMY  07/1992   ESOPHAGOGASTRODUODENOSCOPY N/A 01/14/2016   Procedure: ESOPHAGOGASTRODUODENOSCOPY (EGD);  Surgeon: Napoleon Form, MD;  Location[facility] ENDOSCOPY;  Service: Endoscopy;  Laterality: N/A;     A IV Location/Drains/Wounds Patient Lines/Drains/Airways Status     Active Line/Drains/Airways     Name Placement date Placement time Site Days   Peripheral IV 11/20/22 20 G Right Antecubital 11/20/22  2020  Antecubital  1            Intake/Output Last 24 hours  Intake/Output Summary (Last 24 hours) at 11/21/2022 1546 Last data filed at 11/21/2022 1213 Gross per 24 hour  Intake --  Output 1200 ml  Net -1200 ml    Labs/Imaging Results for orders placed or performed during  the hospital encounter of 11/20/22 (from the past 48 hour(s))  SARS Coronavirus 2 by RT PCR (hospital order, performed in [facility] hospital lab) *cepheid single result test* Anterior Nasal Swab     Status: None   Collection Time: 11/20/22  7:02 PM   Specimen: Anterior Nasal Swab  Result Value Ref Range   SARS Coronavirus 2 by RT PCR NEGATIVE NEGATIVE    Comment: Performed at [facility] Lab, [address], [place], [place] [address]  Comprehensive metabolic panel     Status: Abnormal   Collection Time: 11/20/22  7:02 PM  Result Value Ref Range   Sodium 133 (L) 135 - 145 mmol/L    Potassium 4.3 3.5 - 5.1 mmol/L   Chloride 95 (L) 98 - 111 mmol/L   CO2 27 22 - 32 mmol/L   Glucose, Bld 100 (H) 70 - 99 mg/dL    Comment: Glucose reference range applies only to samples taken after fasting for at least 8 hours.   BUN 10 6 - 20 mg/dL   Creatinine, Ser 6.04 0.44 - 1.00 mg/dL   Calcium 8.6 (L) 8.9 - 10.3 mg/dL   Total Protein 6.8 6.5 - 8.1 g/dL   Albumin 3.0 (L) 3.5 - 5.0 g/dL   AST 24 15 - 41 U/L   ALT 17 0 - 44 U/L   Alkaline Phosphatase 121 38 - 126 U/L   Total Bilirubin 0.3 0.3 - 1.2 mg/dL   GFR, Estimated >54 >09 mL/min    Comment: (NOTE) Calculated using the CKD-EPI Creatinine Equation (2021)    Anion gap 11 5 - 15    Comment: Performed at [facility], [address] [place], [place] [address]  Brain natriuretic peptide     Status: None   Collection Time: 11/20/22  7:02 PM  Result Value Ref Range   B Natriuretic Peptide 34.5 0.0 - 100.0 pg/mL    Comment: Performed at [facility], [address], [place], [place] [address]  Troponin I (High Sensitivity)     Status: None   Collection Time: 11/20/22  7:02 PM  Result Value Ref Range   Troponin I (High Sensitivity) 10 <18 ng/L    Comment: (NOTE) Elevated high sensitivity troponin I (hsTnI) values and significant  changes across serial measurements may suggest ACS but many other  chronic and acute conditions are known to elevate hsTnI results.  Refer to the "Links" section for chest pain algorithms and additional  guidance. Performed at [facility], [address], [place], [place] [address]   CBC with Differential     Status: Abnormal   Collection Time: 11/20/22  7:02 PM  Result Value Ref Range   WBC 13.3 (H) 4.0 - 10.5 K/uL   RBC 4.78 3.87 - 5.11 MIL/uL   Hemoglobin 13.7 12.0 - 15.0 g/dL   HCT 08.6 57.8 - 46.9 %   MCV 94.4 80.0 - 100.0 fL   MCH 28.7 26.0 - 34.0 pg   MCHC 30.4 30.0 - 36.0 g/dL   RDW 62.9 52.8 - 41.3 %   Platelets 309 150 - 400 K/uL    Comment: REPEATED TO VERIFY    nRBC 0.0 0.0 - 0.2 %   Neutrophils Relative % 81 %   Neutro Abs 10.8 (H) 1.7 - 7.7 K/uL   Lymphocytes Relative 11 %   Lymphs Abs 1.4 0.7 - 4.0 K/uL   Monocytes Relative 4 %   Monocytes Absolute 0.6 0.1 - 1.0 K/uL  Eosinophils Relative 3 %   Eosinophils Absolute 0.4 0.0 - 0.5 K/uL   Basophils Relative 1 %   Basophils Absolute 0.1 0.0 - 0.1 K/uL   Immature Granulocytes 0 %   Abs Immature Granulocytes 0.05 0.00 - 0.07 K/uL    Comment: Performed at [facility] Lab, [address], [place], [place] [address]  Troponin I (High Sensitivity)     Status: None   Collection Time: 11/20/22 11:30 PM  Result Value Ref Range   Troponin I (High Sensitivity) 11 <18 ng/L    Comment: (NOTE) Elevated high sensitivity troponin I (hsTnI) values and significant  changes across serial measurements may suggest ACS but many other  chronic and acute conditions are known to elevate hsTnI results.  Refer to the "Links" section for chest pain algorithms and additional  guidance. Performed at [facility] Lab, [address], [place], [place] [address]   HIV Antibody (routine testing w rflx)     Status: None   Collection Time: 11/21/22  1:28 AM  Result Value Ref Range   HIV Screen 4th Generation wRfx Non Reactive Non Reactive    Comment: Performed at [facility], [address], [place], [place] [address]  Magnesium     Status: None   Collection Time: 11/21/22  1:28 AM  Result Value Ref Range   Magnesium 1.7 1.7 - 2.4 mg/dL    Comment: Performed at [facility], [address], [place], [place] [address]  CBC     Status: Abnormal   Collection Time: 11/21/22  1:28 AM  Result Value Ref Range   WBC 18.6 (H) 4.0 - 10.5 K/uL   RBC 4.81 3.87 - 5.11 MIL/uL   Hemoglobin 13.7 12.0 - 15.0 g/dL   HCT 28.4 13.2 - 44.0 %   MCV 93.6 80.0 - 100.0 fL   MCH 28.5 26.0 - 34.0 pg   MCHC 30.4 30.0 - 36.0 g/dL   RDW 10.2 72.5 - 36.6 %   Platelets 343 150 - 400 K/uL   nRBC 0.0 0.0 - 0.2 %     Comment: Performed at [facility] Lab, [address] [place], [place] [address]  Basic metabolic panel     Status: Abnormal   Collection Time: 11/21/22  1:28 AM  Result Value Ref Range   Sodium 134 (L) 135 - 145 mmol/L   Potassium 4.0 3.5 - 5.1 mmol/L   Chloride 95 (L) 98 - 111 mmol/L   CO2 28 22 - 32 mmol/L   Glucose, Bld 108 (H) 70 - 99 mg/dL    Comment: Glucose reference range applies only to samples taken after fasting for at least 8 hours.   BUN 8 6 - 20 mg/dL   Creatinine, Ser 7.42 0.44 - 1.00 mg/dL   Calcium 8.7 (L) 8.9 - 10.3 mg/dL   GFR, Estimated >59 >56 mL/min    Comment: (NOTE) Calculated using the CKD-EPI Creatinine Equation (2021)    Anion gap 11 5 - 15    Comment: Performed at [facility], [address] [place], [place] [address]   *Note: Due to a large number of results and/or encounters for the requested time period, some results have not been displayed. A complete set of results can be found in Results Review.   ECHOCARDIOGRAM COMPLETE  Result Date: 11/21/2022    ECHOCARDIOGRAM REPORT   Patient Name:   MATILYN OLINSKI Covino Date of Exam: 11/21/2022 Medical Rec #:  [id number]  Height:       61.0 in Accession #:    [id number]   Weight:       289.0 lb Date of Birth:  Oct 05, 1970    BSA:          2.209 m Patient Age:    51 years     BP:           110/86 mmHg Patient Gender: F            HR:           68 bpm. Exam Location:  Inpatient Procedure: 2D Echo and Intracardiac Opacification Agent Indications:    respiratory failure  History:        Patient has prior history of Echocardiogram examinations, most                 recent 04/24/2022. Risk Factors:Hypertension and Dyslipidemia.  Sonographer:    Melissa Morford RDCS (AE, PE) Referring Phys: [id number] Floreen Comber PATEL  Sonographer Comments: Technically difficult study due to poor echo windows and patient is obese. IMPRESSIONS  1. Left ventricular ejection fraction, by estimation, is 60 to 65%. The left ventricle has normal  function. The left ventricle has no regional wall motion abnormalities. Left ventricular diastolic function could not be evaluated.  2. Right ventricular systolic function is normal. The right ventricular size is normal.  3. The mitral valve is normal in structure. No evidence of mitral valve regurgitation.  4. The aortic valve was not well visualized. Aortic valve regurgitation is not visualized. No aortic stenosis is present. Conclusion(s)/Recommendation(s): Technically very limited study due to poor sound wave transmission. FINDINGS  Left Ventricle: Left ventricular ejection fraction, by estimation, is 60 to 65%. The left ventricle has normal function. The left ventricle has no regional wall motion abnormalities. Definity contrast agent was given IV to delineate the left ventricular  endocardial borders. The left ventricular internal cavity size was normal in size. There is no left ventricular hypertrophy. Left ventricular diastolic function could not be evaluated. Right Ventricle: The right ventricular size is normal. No increase in right ventricular wall thickness. Right ventricular systolic function is normal. Left Atrium: Left atrial size was not well visualized. Right Atrium: Right atrial size was not well visualized. Pericardium: There is no evidence of pericardial effusion. Mitral Valve: The mitral valve is normal in structure. No evidence of mitral valve regurgitation. Tricuspid Valve: The tricuspid valve is not well visualized. Tricuspid valve regurgitation is not demonstrated. Aortic Valve: The aortic valve was not well visualized. Aortic valve regurgitation is not visualized. No aortic stenosis is present. Pulmonic Valve: The pulmonic valve was not well visualized. Pulmonic valve regurgitation is not visualized. Aorta: The aortic root is normal in size and structure. Venous: The inferior vena cava was not well visualized. IAS/Shunts: The interatrial septum was not well visualized.  LEFT VENTRICLE PLAX  2D LVIDd:         4.90 cm LVIDs:         3.90 cm LV PW:         1.10 cm LV IVS:        1.10 cm LVOT diam:     2.10 cm LVOT Area:     3.46 cm  LEFT ATRIUM         Index LA diam:    4.30 cm 1.95 cm/m   SHUNTS Systemic Diam: 2.10 cm Arvilla Meres MD Electronically signed by Arvilla Meres MD Signature Date/Time: 11/21/2022/11:10:18 AM    Final  DG Chest 2 View  Result Date: 11/20/2022 CLINICAL DATA:  Shortness of breath EXAM: CHEST - 2 VIEW COMPARISON:  04/21/2022 FINDINGS: There is cardiomegaly with vascular congestion. Low lung volumes. Stable elevation of the right hemidiaphragm. Diffuse interstitial prominence with perihilar opacities, likely mild edema. Right base atelectasis. No effusions or acute bony abnormality. IMPRESSION: Cardiomegaly with vascular congestion and mild pulmonary edema. Elevation of the right hemidiaphragm.  Right base atelectasis. Electronically Signed   By: Charlett Nose M.D.   On: 11/20/2022 20:00    Pending Labs Unresulted Labs (From admission, onward)     Start     Ordered   11/22/22 0500  CBC with Differential/Platelet  Tomorrow morning,   R        11/21/22 0821   11/22/22 0500  Comprehensive metabolic panel  Tomorrow morning,   R        11/21/22 0821   11/21/22 0821  Procalcitonin  Add-on,   AD       References:    Procalcitonin Lower Respiratory Tract Infection AND Sepsis Procalcitonin Algorithm   11/21/22 0820            Vitals/Pain Today's Vitals   11/21/22 1330 11/21/22 1500 11/21/22 1530 11/21/22 1545  BP: 122/75     Pulse: 81 80 73 72  Resp: (!) 24 18 17 19   Temp:      TempSrc:      SpO2: 96% 97% 97% 98%  PainSc:        Isolation Precautions No active isolations  Medications Medications  enoxaparin (LOVENOX) injection 40 mg (40 mg Subcutaneous Given 11/21/22 1050)  furosemide (LASIX) injection 40 mg (40 mg Intravenous Given 11/21/22 0837)  sodium chloride flush (NS) 0.9 % injection 3 mL (3 mLs Intravenous Given 11/21/22 1054)   acetaminophen (TYLENOL) tablet 650 mg (has no administration in time range)    Or  acetaminophen (TYLENOL) suppository 650 mg (has no administration in time range)  ondansetron (ZOFRAN) tablet 4 mg (has no administration in time range)    Or  ondansetron (ZOFRAN) injection 4 mg (has no administration in time range)  senna-docusate (Senokot-S) tablet 1 tablet (has no administration in time range)  albuterol (PROVENTIL) (2.5 MG/3ML) 0.083% nebulizer solution 3 mL (has no administration in time range)  ARIPiprazole (ABILIFY) tablet 15 mg (15 mg Oral Given 11/21/22 1051)  Armodafinil 250 mg (250 mg Oral Not Given 11/21/22 1013)  buPROPion (WELLBUTRIN XL) 24 hr tablet 300 mg (300 mg Oral Given 11/21/22 1052)  clonazePAM (KLONOPIN) tablet 1 mg (has no administration in time range)  dicloxacillin (DYNAPEN) capsule 250 mg (250 mg Oral Given 11/21/22 1350)  DULoxetine (CYMBALTA) DR capsule 60 mg (60 mg Oral Given 11/21/22 1051)  mometasone-formoterol (DULERA) 100-5 MCG/ACT inhaler 2 puff (2 puffs Inhalation Not Given 11/21/22 0810)  gabapentin (NEURONTIN) capsule 600 mg (600 mg Oral Given 11/20/22 2344)  HYDROcodone-acetaminophen (NORCO) 10-325 MG per tablet 1 tablet (1 tablet Oral Given 11/20/22 2345)  nebivolol (BYSTOLIC) tablet 10 mg (10 mg Oral Given 11/21/22 1052)  Oxcarbazepine (TRILEPTAL) tablet 300 mg (300 mg Oral Given 11/21/22 1051)  pyridOXINE (VITAMIN B6) tablet 100 mg (100 mg Oral Given 11/21/22 1051)  pantoprazole (PROTONIX) EC tablet 40 mg (40 mg Oral Given 11/21/22 1050)  ALPRAZolam (XANAX) tablet 0.5 mg (0.5 mg Oral Given 11/20/22 2344)  perflutren lipid microspheres (DEFINITY) IV suspension (5 mLs Intravenous Given 11/21/22 0956)  clonazePAM (KLONOPIN) tablet 0.5 mg (0.5 mg Oral Given 11/20/22 2115)  furosemide (LASIX) injection 40  mg (40 mg Intravenous Given 11/20/22 2115)    Mobility non-ambulatory     Focused Assessments Pulmonary Assessment Handoff:  Lung sounds: Bilateral Breath Sounds: Diminished,  Expiratory wheezes L Breath Sounds: Diminished, Expiratory wheezes R Breath Sounds: Diminished, Expiratory wheezes O2 Device: Room Air O2 Flow Rate (L/min): 3 L/min    R Recommendations: See Admitting Provider Note  Report given to:   Additional Notes: Pt is on a bariatric bed and provides minimal assistance w/ ADLs.  Pt has breakdown on buttocks and posterior thighs.  We've placed sacral pads and barrier cream.

## 2022-11-21 NOTE — ED Notes (Signed)
Help get patient sheets changed and reposition in the bed patient is sitting up eating

## 2022-11-21 NOTE — Progress Notes (Signed)
Heart Failure Navigator Progress Note  Assessed for Heart & Vascular TOC clinic readiness.  Patient does not meet criteria due to bed bound status. .   Navigator will sign off at this time.   [name], BSN, Scientist, clinical (histocompatibility and immunogenetics) Only

## 2022-11-21 NOTE — Progress Notes (Signed)
Patient verbalized to this RN that she wants to be Full Code. Charge nurse and MD notified.

## 2022-11-22 ENCOUNTER — Inpatient Hospital Stay (HOSPITAL_COMMUNITY): Payer: 59

## 2022-11-22 ENCOUNTER — Other Ambulatory Visit: Payer: Self-pay | Admitting: Family Medicine

## 2022-11-22 DIAGNOSIS — J9601 Acute respiratory failure with hypoxia: Secondary | ICD-10-CM | POA: Diagnosis not present

## 2022-11-22 LAB — CBC WITH DIFFERENTIAL/PLATELET
Abs Immature Granulocytes: 0.03 10*3/uL (ref 0.00–0.07)
Basophils Absolute: 0 10*3/uL (ref 0.0–0.1)
Basophils Relative: 0 %
Eosinophils Absolute: 0.4 10*3/uL (ref 0.0–0.5)
Eosinophils Relative: 4 %
HCT: 40.6 % (ref 36.0–46.0)
Hemoglobin: 12.5 g/dL (ref 12.0–15.0)
Immature Granulocytes: 0 %
Lymphocytes Relative: 16 %
Lymphs Abs: 1.7 10*3/uL (ref 0.7–4.0)
MCH: 29.1 pg (ref 26.0–34.0)
MCHC: 30.8 g/dL (ref 30.0–36.0)
MCV: 94.4 fL (ref 80.0–100.0)
Monocytes Absolute: 0.8 10*3/uL (ref 0.1–1.0)
Monocytes Relative: 7 %
Neutro Abs: 7.8 10*3/uL — ABNORMAL HIGH (ref 1.7–7.7)
Neutrophils Relative %: 73 %
Platelets: 295 10*3/uL (ref 150–400)
RBC: 4.3 MIL/uL (ref 3.87–5.11)
RDW: 14 % (ref 11.5–15.5)
WBC: 10.8 10*3/uL — ABNORMAL HIGH (ref 4.0–10.5)
nRBC: 0 % (ref 0.0–0.2)

## 2022-11-22 LAB — COMPREHENSIVE METABOLIC PANEL
ALT: 14 U/L (ref 0–44)
AST: 17 U/L (ref 15–41)
Albumin: 2.8 g/dL — ABNORMAL LOW (ref 3.5–5.0)
Alkaline Phosphatase: 102 U/L (ref 38–126)
Anion gap: 9 (ref 5–15)
BUN: 7 mg/dL (ref 6–20)
CO2: 34 mmol/L — ABNORMAL HIGH (ref 22–32)
Calcium: 8.4 mg/dL — ABNORMAL LOW (ref 8.9–10.3)
Chloride: 92 mmol/L — ABNORMAL LOW (ref 98–111)
Creatinine, Ser: 0.67 mg/dL (ref 0.44–1.00)
GFR, Estimated: >60 mL/min (ref >60)
Glucose, Bld: 99 mg/dL (ref 70–99)
Potassium: 3.4 mmol/L — ABNORMAL LOW (ref 3.5–5.1)
Sodium: 135 mmol/L (ref 135–145)
Total Bilirubin: 0.3 mg/dL (ref 0.3–1.2)
Total Protein: 6.7 g/dL (ref 6.5–8.1)

## 2022-11-22 MED ORDER — POTASSIUM CHLORIDE 20 MEQ PO PACK
40.0000 meq | PACK | Freq: Two times a day (BID) | ORAL | Status: DC
  Administered 2022-11-22 – 2022-11-24 (×5): 40 meq via ORAL
  Filled 2022-11-22 (×5): qty 2

## 2022-11-22 NOTE — TOC Initial Note (Signed)
Transition of Care [facility]) - Initial/Assessment Note    Patient Details  Name: [name] MRN: [id number] Date of Birth: 01/20/1971  Transition of Care [facility] CM/SW Contact:    Leone Haven, RN Phone Number: 11/22/2022, 3:04 PM  Clinical Narrative:                 From home with spouse and son.  She has PCP , Dr Nathaniel Man, she has insurance on file.  She is currently active with [facility] for [facility], HHPT, HHOT and she states she would like to continue with them.  She has a hospital bed that she states is broke, which she got thru [facility], a walker, a cane and a shower chair.  Her Spouse and son are her support system, she will need ambulance transport at dc.  She gets her medications from [employer] thru delivery.  She states she walks at home with therapy , she is learning how to walk again per patient.    Expected Discharge Plan: Home w Home Health Services Barriers to Discharge: No Barriers Identified   Patient Goals and CMS Choice Patient states their goals for this hospitalization and ongoing recovery are:: return home with [facility] CMS Medicare.gov Compare Post Acute Care list provided to:: Patient Choice offered to / list presented to : Patient      Expected Discharge Plan and Services In-house Referral: NA Discharge Planning Services: CM Consult Post Acute Care Choice: Home Health Living arrangements for the past 2 months: Single Family Home                 DME Arranged: N/A DME Agency: NA       HH Arranged: NA          Prior Living Arrangements/Services Living arrangements for the past 2 months: Single Family Home Lives with:: Spouse, Adult Children Patient language and need for interpreter reviewed:: Yes Do you feel safe going back to the place where you live?: Yes      Need for Family Participation in Patient Care: Yes (Comment) Care giver support system in place?: Yes (comment) Current home services: DME (hospital bed, walker, cane , shower  chair) Criminal Activity/Legal Involvement Pertinent to Current Situation/Hospitalization: No - Comment as needed  Activities of Daily Living      Permission Sought/Granted Permission sought to share information with : Case Manager Permission granted to share information with : Yes, Verbal Permission Granted     Permission granted to share info w AGENCY: [facility]        Emotional Assessment Appearance:: Appears stated age Attitude/Demeanor/Rapport: Engaged Affect (typically observed): Appropriate Orientation: : Oriented to Self, Oriented to Place, Oriented to  Time, Oriented to Situation Alcohol / Substance Use: Not Applicable Psych Involvement: No (comment)  Admission diagnosis:  Acute respiratory failure with hypoxia (HCC) [J96.01] Patient Active Problem List   Diagnosis Date Noted   Nocturnal hypoxemia 06/21/2022   Elevated LFTs 05/06/2022   Obesity hypoventilation syndrome (HCC) 05/06/2022   Acute respiratory failure with hypoxia (HCC) 04/22/2022   Hypokalemia 04/22/2022   Leg cramps 01/12/2022   CAP (community acquired pneumonia) 08/12/2021   Fever of unknown origin 08/12/2021   Sacral pressure sore 07/06/2021   Vitamin B6 deficiency 06/08/2021   Myelomalacia of cervical cord (HCC) 06/06/2021   Compressive cervical cord myelomalacia (HCC) 06/05/2021   B12 deficiency 06/05/2021   Folate deficiency 06/05/2021   Vitamin D deficiency 06/05/2021   Leg weakness, bilateral 06/04/2021   Cervical atypia 01/06/2021  Steatosis of liver 01/06/2021   Hyponatremia 07/27/2019   Sepsis (HCC) 07/26/2019   Urinary urgency 04/04/2018   Chronic narcotic use 09/07/2016   Acute blood loss anemia 01/14/2016   Multiple gastric ulcers    PAN (polyarteritis nodosa) (HCC) 11/24/2015   GERD (gastroesophageal reflux disease) 01/08/2014   Routine general medical examination at a health care facility 04/23/2013   Bariatric surgery status 10/31/2012   S/P skin biopsy 10/31/2012    Psoriasis 01/09/2012   DDD (degenerative disc disease), lumbar 11/30/2011   Migraine headache    [name] (obstructive sleep apnea) 11/16/2010   HYPERGLYCEMIA, FASTING 10/08/2009   CONTRACTURE OF TENDON 08/17/2009   PARESTHESIA, HANDS 12/23/2008   Leg pain, right 11/05/2008   ADVERSE DRUG REACTION 04/03/2008   PERIPHERAL EDEMA 03/26/2008   Obesity, morbid, BMI 50 or higher (HCC) 01/03/2007   Cellulitis 01/03/2007   Hyperlipidemia 09/22/2006   Fibromyalgia 09/20/2006   Anxiety and depression 06/28/2006   Essential hypertension 06/28/2006   PCP:  Sheliah Hatch, MD Pharmacy:   [facility], [place] - [address] [place] [address] Phone: [phone] Fax: [phone]  [id number] [place], [place] - [address] AT [facility] OF [address] [place] [facility] [address] Phone: [phone] Fax: [phone]     Social Determinants of Health (SDOH) Social History: SDOH Screenings   Food Insecurity: No Food Insecurity (11/02/2022)  Housing: Low Risk  (11/02/2022)  Transportation Needs: No Transportation Needs (11/02/2022)  Utilities: Not At Risk (11/02/2022)  Alcohol Screen: Low Risk  (11/02/2022)  Depression (PHQ2-9): Low Risk  (11/15/2022)  Recent Concern: Depression (PHQ2-9) - Medium Risk (11/02/2022)  Financial Resource Strain: Low Risk  (11/02/2022)  Physical Activity: Inactive (11/02/2022)  Social Connections: Socially Isolated (11/02/2022)  Stress: Stress Concern Present (11/02/2022)  Tobacco Use: Low Risk  (11/20/2022)   SDOH Interventions:     Readmission Risk Interventions    04/24/2022   11:10 AM  Readmission Risk Prevention Plan  Transportation Screening Complete  HRI or Home Care Consult Complete  Social Work Consult for Recovery Care Planning/Counseling Complete  Palliative Care Screening Not Applicable  Medication Review Oceanographer) Referral to Pharmacy

## 2022-11-22 NOTE — Progress Notes (Signed)
Progress Note   Patient: [name] DOB: [birth year] DOA: 11/20/2022     1 DOS: the patient was seen and examined on 11/22/2022   Brief hospital course: [name] is a 52 y.o. female with medical history significant for cervical cord myelomalacia with bedbound status, asthma, HTN, polyarteritis nodosa, depression/anxiety, chronic pain on chronic opioid therapy, fibromyalgia, morbid obesity, [name] who is admitted with acute respiratory failure with hypoxia.  Patient is currently on 3 L of oxygen via nasal cannula.  Currently undergoing diuresis reporting some improvement in shortness of breath.  However still oxygen dependent on 2 L via nasal cannula  Assessment and Plan: Acute respiratory failure with hypoxia: SpO2 84% on RA PTA.  She has been weaned down to 2 L O2 via [place].  Does not use supplemental O2 at baseline.  CXR shows cardiomegaly with pulmonary edema.  Suspicion is for underlying likely diastolic CHF.  Previous echocardiograms have been limited due to body habitus.  -Echocardiogram, done technically difficult study due to body habitus.  However EF is preserved, could not visualize inferior vena cava to assess fluid status.  BNP is within normal limits but this can be normal in obese individuals. -Will repeat chest x-ray to assess improvement. -IV Lasix 40 mg twice daily to be continued. -Strict I/O's and daily weights -Continue supplemental O2 and wean off as able -If patient continues to be hypoxic unable to wean oxygen despite diuresis will consider getting CT of the chest with contrast.   Asthma: No obvious wheezing on admission.  Continue Dulera and albuterol as needed.   Cellulitis of left lower extremity: Started on antibiotics as an outpatient, significantly improving per patient.  Continue dicloxacillin. Clinically seems to be improving, however leukocytosis has worsened. Check procalcitonin.   Depression/anxiety: Mood stable.  On multiple  medications. -Continue Abilify, bupropion, Cymbalta, Trileptal -Continue home Xanax 0.5 mg BID prn -Also on Klonopin 1 mg at bedtime for RLS/sleep   Chronic pain syndrome: Continue gabapentin and home Norco as needed.   [name]: Continue CPAP nightly.  Appears to also be on armodafinil for somnolence, will continue.   Morbid obesity: Estimated body mass index is 54.61 kg/m as calculated from the following:   Height as of 10/27/22: 5\' 1"  (1.549 m).   Weight as of 10/27/22: 131.1 kg.    Cervical cord myelomalacia: Chronic issue with diminished mobility.  Continue gabapentin.   DVT prophylaxis: enoxaparin (LOVENOX) injection 40 mg Start: 11/21/22 1000      Subjective: Was seen and examined at bedside today.  Patient reports improvement in shortness of breath denies having any chest pain palpitations.  Still dependent on oxygen 2 L via nasal cannula.  Physical Exam: Vitals:   11/21/22 2024 11/21/22 2342 11/22/22 0401 11/22/22 0730  BP:  101/63 (!) 124/59 (!) 140/85  Pulse:  83 70 76  Resp:  19 (!) 22 20  Temp:  98.6 F (37 C) 98.1 F (36.7 C) 97.6 F (36.4 C)  TempSrc:  Oral Axillary Oral  SpO2: 97% 95% 97% 97%   Constitutional: Morbidly obese woman resting in bed with head elevated, NAD, calm, comfortable Eyes: EOMI, lids and conjunctivae normal ENMT: Mucous membranes are moist. Posterior pharynx clear of any exudate or lesions.Normal dentition.  Neck: normal, supple, no masses. Respiratory: Exam limited due to body habitus, distant breath sounds clear to auscultation. Normal respiratory effort while on 2 L O2 via [place]. No accessory muscle use.  Cardiovascular: Regular rate and rhythm, no murmurs / rubs /  gallops.  Obese lower extremities with trace bilateral lower extremity edema. 2+ pedal pulses. Abdomen: no tenderness, no masses palpated.  Musculoskeletal: no clubbing / cyanosis. No joint deformity upper and lower extremities. Good ROM, no contractures. Normal muscle tone.   Skin: Venous stasis changes lower extremities Neurologic: Sensation intact. Strength equal bilaterally. Psychiatric: Normal judgment and insight. Alert and oriented x 3. Normal mood.  Data Reviewed:  BMP reviewed  Family Communication: Patient is alert and oriented  Disposition: Status is: Observation The patient will require care spanning > 2 midnights and should be moved to inpatient because: Shortness of breath and oxygen dependent  Planned Discharge Destination: Home    Time spent: 35 minutes  Author: Harold Hedge, MD 11/22/2022 2:21 PM  For on call review [email or web address].

## 2022-11-22 NOTE — Telephone Encounter (Signed)
Patient is requesting a refill of the following medications: Requested Prescriptions   Pending Prescriptions Disp Refills   clonazePAM (KLONOPIN) 1 MG tablet [Pharmacy Med Name: clonazepam 1 mg tablet] 30 tablet 3    Sig: TAKE 1 TABLET BY MOUTH AT BEDTIME    Date of patient request: 11/22/22 Last office visit: 11/09/22 Date of last refill: 07/20/22 Last refill amount: 30

## 2022-11-23 DIAGNOSIS — J9601 Acute respiratory failure with hypoxia: Secondary | ICD-10-CM | POA: Diagnosis not present

## 2022-11-23 DIAGNOSIS — I5033 Acute on chronic diastolic (congestive) heart failure: Secondary | ICD-10-CM | POA: Insufficient documentation

## 2022-11-23 DIAGNOSIS — L8990 Pressure ulcer of unspecified site, unspecified stage: Secondary | ICD-10-CM | POA: Insufficient documentation

## 2022-11-23 LAB — CBC
HCT: 43.2 % (ref 36.0–46.0)
Hemoglobin: 13.3 g/dL (ref 12.0–15.0)
MCH: 28.8 pg (ref 26.0–34.0)
MCHC: 30.8 g/dL (ref 30.0–36.0)
MCV: 93.5 fL (ref 80.0–100.0)
Platelets: 317 10*3/uL (ref 150–400)
RBC: 4.62 MIL/uL (ref 3.87–5.11)
RDW: 13.9 % (ref 11.5–15.5)
WBC: 9.5 10*3/uL (ref 4.0–10.5)
nRBC: 0 % (ref 0.0–0.2)

## 2022-11-23 LAB — BASIC METABOLIC PANEL
Anion gap: 10 (ref 5–15)
BUN: 6 mg/dL (ref 6–20)
CO2: 34 mmol/L — ABNORMAL HIGH (ref 22–32)
Calcium: 8.7 mg/dL — ABNORMAL LOW (ref 8.9–10.3)
Chloride: 93 mmol/L — ABNORMAL LOW (ref 98–111)
Creatinine, Ser: 0.68 mg/dL (ref 0.44–1.00)
GFR, Estimated: >60 mL/min (ref >60)
Glucose, Bld: 100 mg/dL — ABNORMAL HIGH (ref 70–99)
Potassium: 3.6 mmol/L (ref 3.5–5.1)
Sodium: 137 mmol/L (ref 135–145)

## 2022-11-23 NOTE — Progress Notes (Signed)
   11/22/22 2345  BiPAP/CPAP/SIPAP  $ Non-Invasive Ventilator  Non-Invasive Vent Subsequent  BiPAP/CPAP/SIPAP Pt Type Adult  BiPAP/CPAP/SIPAP DREAMSTATIOND  Mask Type Full face mask  Mask Size Small  Patient Home Equipment No  Auto Titrate Yes (Min 4 Max 18)  CPAP/SIPAP surface wiped down Yes   Patient resting comfortably on bipap, RT will continue to monitor

## 2022-11-23 NOTE — Progress Notes (Signed)
PROGRESS NOTE    [name]  BJY:[id number] DOB: May 03, 1971 DOA: 11/20/2022 PCP: Sheliah Hatch, MD     Brief Narrative:  [name] is a 52 y.o. female with medical history significant for cervical cord myelomalacia with bedbound status, asthma, HTN, polyarteritis nodosa, depression/anxiety, chronic pain on chronic opioid therapy, fibromyalgia, morbid obesity, [name] who is admitted with acute respiratory failure with hypoxia.  Has been diuresing well with IV Lasix.  Also on antibiotics for left lower leg cellulitis.  New events last 24 hours / Subjective: Sleeping this morning but arousable and able to answer questions appropriately.  She has no new complaints today.  Urine output recorded yesterday.  Remains on IV Lasix.  Assessment & Plan:   Principal Problem:   Acute respiratory failure with hypoxia (HCC) Active Problems:   Compressive cervical cord myelomalacia (HCC)   Anxiety and depression   Cellulitis   Fibromyalgia   [name] (obstructive sleep apnea)   Myelomalacia of cervical cord (HCC)   Acute on chronic diastolic CHF (congestive heart failure) (HCC)   Pressure injury of skin   Acute hypoxemic respiratory failure -Currently on 1.5 L nasal cannula O2, weaning as able  Acute on chronic diastolic heart failure -BNP only 34.5, chest x-ray showing mild central pulmonary vascular congestion and possible pulmonary edema -Echocardiogram showed EF 60 to 65%, technically very limited due to poor sound wave transmission -Continue IV Lasix, Bystolic -Strict I's and O's and daily weight  Left lower extremity cellulitis -Dicloxacillin, completed dose -Leukocytosis resolved.  Procalcitonin negative  Mood disorder -Abilify, bupropion, Cymbalta, Trileptal, Xanax, Klonopin, modafinil  Chronic pain syndrome -Gabapentin, Norco  [name] -CPAP nightly  Morbid obesity -BMI 54  Cervical cord myelomalacia -Bedbound status   In agreement with assessment of the pressure  ulcer as below:  Pressure Injury 11/21/22 Buttocks Right Stage 2 -  Partial thickness loss of dermis presenting as a shallow open injury with a red, pink wound bed without slough. (Active)  11/21/22 1849  Location: Buttocks  Location Orientation: Right  Staging: Stage 2 -  Partial thickness loss of dermis presenting as a shallow open injury with a red, pink wound bed without slough.  Wound Description (Comments):   Present on Admission: Yes  Dressing Type Foam - Lift dressing to assess site every shift 11/22/22 2030     Pressure Injury 11/21/22 Buttocks Left (Active)  11/21/22 2150  Location: Buttocks  Location Orientation: Left  Staging:   Wound Description (Comments):   Present on Admission: Yes  Dressing Type Foam - Lift dressing to assess site every shift 11/22/22 2030         DVT prophylaxis:  enoxaparin (LOVENOX) injection 40 mg Start: 11/21/22 1000  Code Status: Full code Family Communication: None at bedside Disposition Plan: Plan for discharge home with home health, next 1 to 2 days Status is: Inpatient Remains inpatient appropriate because: Remains on IV Lasix    Antimicrobials:  Anti-infectives (From admission, onward)    Start     Dose/Rate Route Frequency Ordered Stop   11/20/22 2300  dicloxacillin (DYNAPEN) capsule 250 mg        250 mg Oral 4 times daily 11/20/22 2255 11/22/22 2159        Objective: Vitals:   11/22/22 2016 11/22/22 2315 11/23/22 0421 11/23/22 0738  BP: 134/83 127/76 (!) 143/85 125/78  Pulse: (!) 39 73 78 80  Resp: 18 20 20 20   Temp: 98.5 F (36.9 C) 98.7 F (37.1 C) 98.5 F (  36.9 C) 98.5 F (36.9 C)  TempSrc: Oral Oral Oral Oral  SpO2: 97% 98% 100% 99%    Intake/Output Summary (Last 24 hours) at 11/23/2022 1044 Last data filed at 11/22/2022 2317 Gross per 24 hour  Intake 240 ml  Output 1300 ml  Net -1060 ml   Filed Weights    Examination:  General exam: Appears calm and comfortable  Respiratory system: Clear to  auscultation. Respiratory effort normal. No respiratory distress. No conversational dyspnea.  Cardiovascular system: S1 & S2 heard, RRR. No murmurs. + Nonpitting bilateral pedal edema. Gastrointestinal system: Abdomen is nondistended, soft and nontender. Normal bowel sounds heard. Central nervous system: Alert and oriented. No focal neurological deficits. Speech clear.  Extremities: Symmetric in appearance  Skin: No rashes, lesions or ulcers on exposed skin  Psychiatry: Judgement and insight appear normal. Mood & affect appropriate.   Data Reviewed: I have personally reviewed following labs and imaging studies  CBC: Recent Labs  Lab 11/20/22 1902 11/21/22 0128 11/22/22 0047 11/23/22 0806  WBC 13.3* 18.6* 10.8* 9.5  NEUTROABS 10.8*  --  7.8*  --   HGB 13.7 13.7 12.5 13.3  HCT 45.1 45.0 40.6 43.2  MCV 94.4 93.6 94.4 93.5  PLT 309 343 295 317   Basic Metabolic Panel: Recent Labs  Lab 11/20/22 1902 11/21/22 0128 11/22/22 0047 11/23/22 0806  NA 133* 134* 135 137  K 4.3 4.0 3.4* 3.6  CL 95* 95* 92* 93*  CO2 27 28 34* 34*  GLUCOSE 100* 108* 99 100*  BUN 10 8 7 6   CREATININE 0.73 0.69 0.67 0.68  CALCIUM 8.6* 8.7* 8.4* 8.7*  MG  --  1.7  --   --    GFR: CrCl cannot be calculated (Unknown ideal weight.). Liver Function Tests: Recent Labs  Lab 11/20/22 1902 11/22/22 0047  AST 24 17  ALT 17 14  ALKPHOS 121 102  BILITOT 0.3 0.3  PROT 6.8 6.7  ALBUMIN 3.0* 2.8*   No results for input(s): "LIPASE", "AMYLASE" in the last 168 hours. No results for input(s): "AMMONIA" in the last 168 hours. Coagulation Profile: No results for input(s): "INR", "PROTIME" in the last 168 hours. Cardiac Enzymes: No results for input(s): "CKTOTAL", "CKMB", "CKMBINDEX", "TROPONINI" in the last 168 hours. BNP (last 3 results) No results for input(s): "PROBNP" in the last 8760 hours. HbA1C: No results for input(s): "HGBA1C" in the last 72 hours. CBG: No results for input(s): "GLUCAP" in the  last 168 hours. Lipid Profile: No results for input(s): "CHOL", "HDL", "LDLCALC", "TRIG", "CHOLHDL", "LDLDIRECT" in the last 72 hours. Thyroid Function Tests: No results for input(s): "TSH", "T4TOTAL", "FREET4", "T3FREE", "THYROIDAB" in the last 72 hours. Anemia Panel: No results for input(s): "VITAMINB12", "FOLATE", "FERRITIN", "TIBC", "IRON", "RETICCTPCT" in the last 72 hours. Sepsis Labs: Recent Labs  Lab 11/21/22 0153  PROCALCITON <0.10    Recent Results (from the past 240 hour(s))  SARS Coronavirus 2 by RT PCR (hospital order, performed in [facility] hospital lab) *cepheid single result test* Anterior Nasal Swab     Status: None   Collection Time: 11/20/22  7:02 PM   Specimen: Anterior Nasal Swab  Result Value Ref Range Status   SARS Coronavirus 2 by RT PCR NEGATIVE NEGATIVE Final    Comment: Performed at [facility] Lab, [address] [place], [place] [address]      Radiology Studies: DG CHEST PORT 1 VIEW  Result Date: 11/22/2022 CLINICAL DATA:  Pulmonary edema. EXAM: PORTABLE CHEST 1 VIEW COMPARISON:  Same day. FINDINGS: Stable cardiomegaly with mild central pulmonary vascular congestion and possible pulmonary edema. Elevated right hemidiaphragm is noted with minimal right basilar subsegmental atelectasis. Bony thorax unremarkable. IMPRESSION: Stable cardiomegaly with mild central pulmonary vascular congestion and possible pulmonary edema. Elevated right hemidiaphragm with minimal right basilar atelectasis. Electronically Signed   By: Lupita Raider M.D.   On: 11/22/2022 15:51   DG CHEST PORT 1 VIEW  Result Date: 11/22/2022 CLINICAL DATA:  200808 Hypoxia [id number] EXAM: PORTABLE CHEST 1 VIEW COMPARISON:  November 20, 2022 FINDINGS: Similar cardiomegaly. Similar elevation of the right hemidiaphragm. No large effusion. Similar pulmonary vascular and interstitial prominence. No lobar consolidation. No acute osseous abnormality. IMPRESSION: Similar findings of cardiomegaly and mild  pulmonary edema. No large effusion. Electronically Signed   By: Olive Bass M.D.   On: 11/22/2022 09:55      Scheduled Meds:  ARIPiprazole  15 mg Oral Daily   buPROPion  300 mg Oral Daily   clonazePAM  1 mg Oral QHS   DULoxetine  60 mg Oral BID   enoxaparin (LOVENOX) injection  40 mg Subcutaneous Q24H   furosemide  40 mg Intravenous Q12H   modafinil  200 mg Oral Daily   mometasone-formoterol  2 puff Inhalation BID   nebivolol  10 mg Oral Daily   Oxcarbazepine  300 mg Oral BID   pantoprazole  40 mg Oral BID   potassium chloride  40 mEq Oral BID   pyridOXINE  100 mg Oral Daily   sodium chloride flush  3 mL Intravenous Q12H   Continuous Infusions:   LOS: 2 days   Time spent: 35 minutes   Noralee Stain, DO [facility] 11/23/2022, 10:44 AM   Available via Epic secure chat 7am-7pm After these hours, please refer to coverage provider listed on [email or web address]

## 2022-11-23 NOTE — Progress Notes (Signed)
   11/23/22 2342  BiPAP/CPAP/SIPAP  BiPAP/CPAP/SIPAP Pt Type Adult  BiPAP/CPAP/SIPAP DREAMSTATIOND  Mask Type Full face mask  Mask Size Small  Respiratory Rate 22 breaths/min  EPAP  (min 4 max 18)  FiO2 (%) 32 %  Flow Rate 3 lpm  Patient Home Equipment No  Auto Titrate Yes

## 2022-11-24 ENCOUNTER — Other Ambulatory Visit (HOSPITAL_COMMUNITY): Payer: Self-pay

## 2022-11-24 DIAGNOSIS — J9601 Acute respiratory failure with hypoxia: Secondary | ICD-10-CM | POA: Diagnosis not present

## 2022-11-24 LAB — BASIC METABOLIC PANEL
Anion gap: 9 (ref 5–15)
BUN: 8 mg/dL (ref 6–20)
CO2: 33 mmol/L — ABNORMAL HIGH (ref 22–32)
Calcium: 8.9 mg/dL (ref 8.9–10.3)
Chloride: 91 mmol/L — ABNORMAL LOW (ref 98–111)
Creatinine, Ser: 0.67 mg/dL (ref 0.44–1.00)
GFR, Estimated: >60 mL/min (ref >60)
Glucose, Bld: 88 mg/dL (ref 70–99)
Potassium: 4 mmol/L (ref 3.5–5.1)
Sodium: 133 mmol/L — ABNORMAL LOW (ref 135–145)

## 2022-11-24 MED ORDER — POTASSIUM CHLORIDE 20 MEQ PO PACK
20.0000 meq | PACK | Freq: Every day | ORAL | 1 refills | Status: DC
  Filled 2022-11-24: qty 30, 30d supply, fill #0

## 2022-11-24 MED ORDER — FUROSEMIDE 40 MG PO TABS
40.0000 mg | ORAL_TABLET | Freq: Every day | ORAL | 1 refills | Status: DC
  Filled 2022-11-24: qty 30, 30d supply, fill #0

## 2022-11-24 MED ORDER — GABAPENTIN 600 MG PO TABS
600.0000 mg | ORAL_TABLET | Freq: Two times a day (BID) | ORAL | 1 refills | Status: DC | PRN
  Filled 2022-11-24: qty 60, 30d supply, fill #0

## 2022-11-24 NOTE — TOC Transition Note (Addendum)
Transition of Care [facility]) - CM/SW Discharge Note   Patient Details  Name: [name] MRN: [id number] Date of Birth: August 06, 1970  [facility]) CM/SW Contact:  Leone Haven, RN Phone Number: 11/24/2022, 11:08 AM   Clinical Narrative:    Patient is for dc today, NCM notified Morrie Sheldon with [facility] of dc. Patient will need ambulance transport.  Patient will possibly need home oxygen, awaiting room air at rest oxygen check.  Per Staff RN patient does not need home oxygen from room air at rest results.  PTAR has been scheduled.   Final next level of care: Home w Home Health Services Barriers to Discharge: No Barriers Identified   Patient Goals and CMS Choice CMS Medicare.gov Compare Post Acute Care list provided to:: Patient Choice offered to / list presented to : Patient  Discharge Placement                         Discharge Plan and Services Additional resources added to the After Visit Summary for   In-house Referral: NA Discharge Planning Services: CM Consult Post Acute Care Choice: Home Health          DME Arranged: N/A DME Agency: NA       HH Arranged: NA          Social Determinants of Health (SDOH) Interventions SDOH Screenings   Food Insecurity: No Food Insecurity (11/02/2022)  Housing: Low Risk  (11/02/2022)  Transportation Needs: No Transportation Needs (11/02/2022)  Utilities: Not At Risk (11/02/2022)  Alcohol Screen: Low Risk  (11/02/2022)  Depression (PHQ2-9): Low Risk  (11/15/2022)  Recent Concern: Depression (PHQ2-9) - Medium Risk (11/02/2022)  Financial Resource Strain: Low Risk  (11/02/2022)  Physical Activity: Inactive (11/02/2022)  Social Connections: Socially Isolated (11/02/2022)  Stress: Stress Concern Present (11/02/2022)  Tobacco Use: Low Risk  (11/20/2022)     Readmission Risk Interventions    04/24/2022   11:10 AM  Readmission Risk Prevention Plan  Transportation Screening Complete  HRI or Home Care Consult Complete   Social Work Consult for Recovery Care Planning/Counseling Complete  Palliative Care Screening Not Applicable  Medication Review Oceanographer) Referral to Pharmacy

## 2022-11-24 NOTE — Care Management Important Message (Signed)
Important Message  Patient Details  Name: [name] MRN: [id number] Date of Birth: [birth year]   Medicare Important Message Given:  Yes     Renie Ora 11/24/2022, 8:49 AM

## 2022-11-24 NOTE — Plan of Care (Signed)
  Problem: Activity: Goal: Capacity to carry out activities will improve 11/24/2022 0445 by Colon Flattery, RN Outcome: Progressing 11/24/2022 0443 by Colon Flattery, RN Outcome: Progressing 11/24/2022 0442 by Colon Flattery, RN Outcome: Progressing 11/23/2022 2334 by Colon Flattery, RN Outcome: Progressing   Problem: Cardiac: Goal: Ability to achieve and maintain adequate cardiopulmonary perfusion will improve 11/24/2022 0445 by Colon Flattery, RN Outcome: Progressing 11/24/2022 0443 by Colon Flattery, RN Outcome: Progressing 11/24/2022 0442 by Colon Flattery, RN Outcome: Progressing 11/23/2022 2334 by Colon Flattery, RN Outcome: Progressing   Problem: Education: Goal: Knowledge of General Education information will improve Description: Including pain rating scale, medication(s)/side effects and non-pharmacologic comfort measures 11/24/2022 0445 by Colon Flattery, RN Outcome: Progressing 11/24/2022 0443 by Colon Flattery, RN Outcome: Progressing 11/24/2022 0442 by Colon Flattery, RN Outcome: Progressing 11/23/2022 2334 by Colon Flattery, RN Outcome: Progressing

## 2022-11-24 NOTE — Plan of Care (Signed)

## 2022-11-24 NOTE — Progress Notes (Signed)
Patient saturating 92-94 percent on room air. Dr Alvino Chapel aware.

## 2022-11-24 NOTE — Discharge Summary (Signed)
Physician Discharge Summary  [name] ZOX:[id number] DOB: 1970-12-17 DOA: 11/20/2022  PCP: Sheliah Hatch, MD  Admit date: 11/20/2022 Discharge date: 11/24/2022  Admitted From: Home Disposition: Home with home health  Recommendations for Outpatient Follow-up:  Follow up with PCP in 1 week  Discharge Condition: Stable CODE STATUS: Full code Diet recommendation: Heart healthy diet  Brief/Interim Summary: [name] is a 52 y.o. female with medical history significant for cervical cord myelomalacia with bedbound status, asthma, HTN, polyarteritis nodosa, depression/anxiety, chronic pain on chronic opioid therapy, fibromyalgia, morbid obesity, [name] who is admitted with acute respiratory failure with hypoxia.  Has been diuresing well with IV Lasix.  Also on antibiotics for left lower leg cellulitis.  Her hypoxia improved and she was weaned off oxygen.  Discharge Diagnoses:   Principal Problem:   Acute respiratory failure with hypoxia (HCC) Active Problems:   Compressive cervical cord myelomalacia (HCC)   Anxiety and depression   Cellulitis   Fibromyalgia   [name] (obstructive sleep apnea)   Myelomalacia of cervical cord (HCC)   Acute on chronic diastolic CHF (congestive heart failure) (HCC)   Pressure injury of skin    Acute hypoxemic respiratory failure -Improved   Acute on chronic diastolic heart failure -BNP only 34.5, chest x-ray showing mild central pulmonary vascular congestion and possible pulmonary edema -Echocardiogram showed EF 60 to 65%, technically very limited due to poor sound wave transmission -Continue IV Lasix --> PO for discharge, Bystolic -Strict I's and O's and daily weight   Left lower extremity cellulitis -Dicloxacillin, completed dose -Leukocytosis resolved.  Procalcitonin negative   Mood disorder -Abilify, bupropion, Cymbalta, Trileptal, Xanax, Klonopin, modafinil   Chronic pain syndrome -Gabapentin, Norco   [name] -CPAP nightly   Morbid  obesity -BMI 54   Cervical cord myelomalacia -Bedbound status.  Home health PT OT RN ordered   In agreement with assessment of the pressure ulcer as below:  Pressure Injury 11/21/22 Buttocks Right Stage 2 -  Partial thickness loss of dermis presenting as a shallow open injury with a red, pink wound bed without slough. (Active)  11/21/22 1849  Location: Buttocks  Location Orientation: Right  Staging: Stage 2 -  Partial thickness loss of dermis presenting as a shallow open injury with a red, pink wound bed without slough.  Wound Description (Comments):   Present on Admission: Yes  Dressing Type Foam - Lift dressing to assess site every shift 11/23/22 2030     Pressure Injury 11/21/22 Buttocks Left (Active)  11/21/22 2150  Location: Buttocks  Location Orientation: Left  Staging:   Wound Description (Comments):   Present on Admission: Yes  Dressing Type Foam - Lift dressing to assess site every shift 11/23/22 2030       Discharge Instructions  Discharge Instructions     (HEART FAILURE PATIENTS) Call MD:  Anytime you have any of the following symptoms: 1) 3 pound weight gain in 24 hours or 5 pounds in 1 week 2) shortness of breath, with or without a dry hacking cough 3) swelling in the hands, feet or stomach 4) if you have to sleep on extra pillows at night in order to breathe.   Complete by: As directed    Call MD for:  difficulty breathing, headache or visual disturbances   Complete by: As directed    Call MD for:  extreme fatigue   Complete by: As directed    Call MD for:  persistant dizziness or light-headedness   Complete by: As directed  Call MD for:  persistant nausea and vomiting   Complete by: As directed    Call MD for:  severe uncontrolled pain   Complete by: As directed    Call MD for:  temperature >100.4   Complete by: As directed    Diet - low sodium heart healthy   Complete by: As directed    Discharge instructions   Complete by: As directed    You were  cared for by a hospitalist during your hospital stay. If you have any questions about your discharge medications or the care you received while you were in the hospital after you are discharged, you can call the unit and ask to speak with the hospitalist on call if the hospitalist that took care of you is not available. Once you are discharged, your primary care physician will handle any further medical issues. Please note that NO REFILLS for any discharge medications will be authorized once you are discharged, as it is imperative that you return to your primary care physician (or establish a relationship with a primary care physician if you do not have one) for your aftercare needs so that they can reassess your need for medications and monitor your lab values.   Increase activity slowly   Complete by: As directed    No wound care   Complete by: As directed       Allergies as of 11/24/2022       Reactions   Niacin Anaphylaxis, Shortness Of Breath, Swelling   Swelling and "problems breathing"   Sulfamethoxazole-trimethoprim Anaphylaxis, Swelling, Rash, Other (See Comments)   Throat closed and the eyes became swollen   Aspirin Hives   Bactrim Hives   Benzoin Compound Rash   Cephalexin Hives   Tolerated ceftriaxone and cefepime 07/2019   Iohexol Hives   Pt treated with PO benedryl   Lisinopril Hives   Naproxen Hives   Pnu-imune [pneumococcal Polysaccharide Vaccine] Rash   Sulfonamide Derivatives Rash        Medication List     STOP taking these medications    dicloxacillin 250 MG capsule Commonly known as: DYNAPEN       TAKE these medications    albuterol 108 (90 Base) MCG/ACT inhaler Commonly known as: VENTOLIN HFA Inhale 2 puffs into the lungs every 6 (six) hours as needed for wheezing or shortness of breath.   ALPRAZolam 0.5 MG tablet Commonly known as: XANAX Take 1 tablet (0.5 mg total) by mouth 2 (two) times daily.   ARIPiprazole 15 MG tablet Commonly known as:  ABILIFY TAKE 1 TABLET BY MOUTH EVERY DAY   Armodafinil 250 MG tablet Take 1 tablet (250 mg total) by mouth in the morning and at bedtime.   Biotin 10 MG Tabs Take 1 tablet (10 mg total) by mouth in the morning.   buPROPion 300 MG 24 hr tablet Commonly known as: WELLBUTRIN XL TAKE 1 TABLET BY MOUTH EVERY DAY   calcium citrate 950 (200 Ca) MG tablet Commonly known as: CALCITRATE - dosed in mg elemental calcium Take 1 tablet (200 mg of elemental calcium total) by mouth daily.   clonazePAM 1 MG tablet Commonly known as: KLONOPIN TAKE 1 TABLET BY MOUTH AT BEDTIME   cyanocobalamin 1000 MCG tablet Commonly known as: VITAMIN B12 TAKE 1 TABLET BY MOUTH EVERY DAY   cyclobenzaprine 10 MG tablet Commonly known as: FLEXERIL TAKE 2 TABLETS BY MOUTH 2 TIMES DAILY   diclofenac Sodium 1 % Gel Commonly known as: VOLTAREN Apply 4  g topically 4 (four) times daily.   DULoxetine 60 MG capsule Commonly known as: CYMBALTA Take 2 capsules (120 mg total) by mouth daily. What changed:  how much to take when to take this   FeroSul 325 (65 Fe) MG tablet Generic drug: ferrous sulfate TAKE 1 TABLET BY MOUTH EVERY MORNING WITH BREAKFAST   fluticasone-salmeterol 100-50 MCG/ACT Aepb Commonly known as: Advair Diskus Inhale 1 puff into the lungs 2 (two) times daily.   folic acid 1 MG tablet Commonly known as: FOLVITE TAKE 1 TABLET BY MOUTH EVERY DAY   furosemide 40 MG tablet Commonly known as: Lasix Take 1 tablet (40 mg total) by mouth daily.   gabapentin 600 MG tablet Commonly known as: NEURONTIN Take 1 tablet (600 mg total) by mouth 3 (three) times daily. TAKE 2 TABLETS BY MOUTH 3 TIMES DAILY What changed:  when to take this reasons to take this additional instructions   GNP Vitamin B-1 100 MG tablet Generic drug: thiamine TAKE 1 TABLET BY MOUTH EVERY DAY   HYDROcodone-acetaminophen 10-325 MG tablet Commonly known as: NORCO TAKE 1 TABLET BY MOUTH EVERY 8 HOURS AS NEEDED    nebivolol 10 MG tablet Commonly known as: BYSTOLIC TAKE 1 TABLET BY MOUTH EVERY DAY   nystatin powder Commonly known as: MYCOSTATIN/NYSTOP APPLY 1 APPLICATION TOPICALLY 3 TIMES DAILY   Oxcarbazepine 300 MG tablet Commonly known as: TRILEPTAL Take 300 mg by mouth 2 (two) times daily.   pantoprazole 40 MG tablet Commonly known as: PROTONIX TAKE 1 TABLET BY MOUTH 2 TIMES DAILY   phentermine 37.5 MG capsule Take 1 capsule (37.5 mg total) by mouth every morning.   potassium chloride 20 MEQ packet Commonly known as: KLOR-CON Take 20 mEq by mouth daily. Take with lasix.   promethazine 25 MG tablet Commonly known as: PHENERGAN TAKE 1 TABLET BY MOUTH EVERY 6 HOURS AS NEEDED FOR NAUSEA AND VOMITING   pyridOXINE 100 MG tablet Commonly known as: VITAMIN B6 TAKE 1 TABLET BY MOUTH EVERY DAY        Follow-up Information     Sheliah Hatch, MD. Schedule an appointment as soon as possible for a visit in 1 week(s).   Specialty: Family Medicine Contact information: [address] [place] [address] [phone]                Allergies  Allergen Reactions   Niacin Anaphylaxis, Shortness Of Breath and Swelling    Swelling and "problems breathing"   Sulfamethoxazole-Trimethoprim Anaphylaxis, Swelling, Rash and Other (See Comments)    Throat closed and the eyes became swollen   Aspirin Hives   Bactrim Hives   Benzoin Compound Rash   Cephalexin Hives    Tolerated ceftriaxone and cefepime 07/2019   Iohexol Hives    Pt treated with PO benedryl   Lisinopril Hives   Naproxen Hives   Pnu-Imune [Pneumococcal Polysaccharide Vaccine] Rash   Sulfonamide Derivatives Rash     Procedures/Studies: DG CHEST PORT 1 VIEW  Result Date: 11/22/2022 CLINICAL DATA:  Pulmonary edema. EXAM: PORTABLE CHEST 1 VIEW COMPARISON:  Same day. FINDINGS: Stable cardiomegaly with mild central pulmonary vascular congestion and possible pulmonary edema. Elevated right hemidiaphragm is  noted with minimal right basilar subsegmental atelectasis. Bony thorax unremarkable. IMPRESSION: Stable cardiomegaly with mild central pulmonary vascular congestion and possible pulmonary edema. Elevated right hemidiaphragm with minimal right basilar atelectasis. Electronically Signed   By: Lupita Raider M.D.   On: 11/22/2022 15:51   DG CHEST PORT  1 VIEW  Result Date: 11/22/2022 CLINICAL DATA:  [id number] Hypoxia [id number] EXAM: PORTABLE CHEST 1 VIEW COMPARISON:  November 20, 2022 FINDINGS: Similar cardiomegaly. Similar elevation of the right hemidiaphragm. No large effusion. Similar pulmonary vascular and interstitial prominence. No lobar consolidation. No acute osseous abnormality. IMPRESSION: Similar findings of cardiomegaly and mild pulmonary edema. No large effusion. Electronically Signed   By: Olive Bass M.D.   On: 11/22/2022 09:55   ECHOCARDIOGRAM COMPLETE  Result Date: 11/21/2022    ECHOCARDIOGRAM REPORT   Patient Name:   [name] Date of Exam: 11/21/2022 Medical Rec #:  [id number]    Height:       61.0 in Accession #:    [id number]   Weight:       289.0 lb Date of Birth:  Aug 12, 1970    BSA:          2.209 m Patient Age:    51 years     BP:           110/86 mmHg Patient Gender: F            HR:           68 bpm. Exam Location:  Inpatient Procedure: 2D Echo and Intracardiac Opacification Agent Indications:    respiratory failure  History:        Patient has prior history of Echocardiogram examinations, most                 recent 04/24/2022. Risk Factors:Hypertension and Dyslipidemia.  Sonographer:    Melissa Morford RDCS (AE, PE) Referring Phys: [id number] Floreen Comber PATEL  Sonographer Comments: Technically difficult study due to poor echo windows and patient is obese. IMPRESSIONS  1. Left ventricular ejection fraction, by estimation, is 60 to 65%. The left ventricle has normal function. The left ventricle has no regional wall motion abnormalities. Left ventricular diastolic function could not be evaluated.  2.  Right ventricular systolic function is normal. The right ventricular size is normal.  3. The mitral valve is normal in structure. No evidence of mitral valve regurgitation.  4. The aortic valve was not well visualized. Aortic valve regurgitation is not visualized. No aortic stenosis is present. Conclusion(s)/Recommendation(s): Technically very limited study due to poor sound wave transmission. FINDINGS  Left Ventricle: Left ventricular ejection fraction, by estimation, is 60 to 65%. The left ventricle has normal function. The left ventricle has no regional wall motion abnormalities. Definity contrast agent was given IV to delineate the left ventricular  endocardial borders. The left ventricular internal cavity size was normal in size. There is no left ventricular hypertrophy. Left ventricular diastolic function could not be evaluated. Right Ventricle: The right ventricular size is normal. No increase in right ventricular wall thickness. Right ventricular systolic function is normal. Left Atrium: Left atrial size was not well visualized. Right Atrium: Right atrial size was not well visualized. Pericardium: There is no evidence of pericardial effusion. Mitral Valve: The mitral valve is normal in structure. No evidence of mitral valve regurgitation. Tricuspid Valve: The tricuspid valve is not well visualized. Tricuspid valve regurgitation is not demonstrated. Aortic Valve: The aortic valve was not well visualized. Aortic valve regurgitation is not visualized. No aortic stenosis is present. Pulmonic Valve: The pulmonic valve was not well visualized. Pulmonic valve regurgitation is not visualized. Aorta: The aortic root is normal in size and structure. Venous: The inferior vena cava was not well visualized. IAS/Shunts: The interatrial septum was not well visualized.  LEFT VENTRICLE PLAX 2D LVIDd:  4.90 cm LVIDs:         3.90 cm LV PW:         1.10 cm LV IVS:        1.10 cm LVOT diam:     2.10 cm LVOT Area:      3.46 cm  LEFT ATRIUM         Index LA diam:    4.30 cm 1.95 cm/m   SHUNTS Systemic Diam: 2.10 cm Arvilla Meres MD Electronically signed by Arvilla Meres MD Signature Date/Time: 11/21/2022/11:10:18 AM    Final    DG Chest 2 View  Result Date: 11/20/2022 CLINICAL DATA:  Shortness of breath EXAM: CHEST - 2 VIEW COMPARISON:  04/21/2022 FINDINGS: There is cardiomegaly with vascular congestion. Low lung volumes. Stable elevation of the right hemidiaphragm. Diffuse interstitial prominence with perihilar opacities, likely mild edema. Right base atelectasis. No effusions or acute bony abnormality. IMPRESSION: Cardiomegaly with vascular congestion and mild pulmonary edema. Elevation of the right hemidiaphragm.  Right base atelectasis. Electronically Signed   By: Charlett Nose M.D.   On: 11/20/2022 20:00       Discharge Exam: Vitals:   11/24/22 0805 11/24/22 0850  BP: 128/69   Pulse: 83   Resp: (!) 23   Temp: 98 F (36.7 C)   SpO2: 98% 90%    General: Pt is alert, awake, not in acute distress Cardiovascular: RRR, S1/S2 +, no edema Respiratory: Difficult to auscultate due to body habitus, clear anteriorly without wheeze or rhonchi Abdominal: Soft, NT, ND, bowel sounds + Extremities: no edema, no cyanosis Psych: Normal mood and affect, stable judgement and insight     The results of significant diagnostics from this hospitalization (including imaging, microbiology, ancillary and laboratory) are listed below for reference.     Microbiology: Recent Results (from the past 240 hour(s))  SARS Coronavirus 2 by RT PCR (hospital order, performed in [facility] hospital lab) *cepheid single result test* Anterior Nasal Swab     Status: None   Collection Time: 11/20/22  7:02 PM   Specimen: Anterior Nasal Swab  Result Value Ref Range Status   SARS Coronavirus 2 by RT PCR NEGATIVE NEGATIVE Final    Comment: Performed at [facility], [address] [place], [place] [address]     Labs: BNP  (last 3 results) Recent Labs    04/21/22 1846 11/20/22 1902  BNP 127.6* 34.5   Basic Metabolic Panel: Recent Labs  Lab 11/20/22 1902 11/21/22 0128 11/22/22 0047 11/23/22 0806 11/24/22 0216  NA 133* 134* 135 137 133*  K 4.3 4.0 3.4* 3.6 4.0  CL 95* 95* 92* 93* 91*  CO2 27 28 34* 34* 33*  GLUCOSE 100* 108* 99 100* 88  BUN 10 8 7 6 8   CREATININE 0.73 0.69 0.67 0.68 0.67  CALCIUM 8.6* 8.7* 8.4* 8.7* 8.9  MG  --  1.7  --   --   --    Liver Function Tests: Recent Labs  Lab 11/20/22 1902 11/22/22 0047  AST 24 17  ALT 17 14  ALKPHOS 121 102  BILITOT 0.3 0.3  PROT 6.8 6.7  ALBUMIN 3.0* 2.8*   No results for input(s): "LIPASE", "AMYLASE" in the last 168 hours. No results for input(s): "AMMONIA" in the last 168 hours. CBC: Recent Labs  Lab 11/20/22 1902 11/21/22 0128 11/22/22 0047 11/23/22 0806  WBC 13.3* 18.6* 10.8* 9.5  NEUTROABS 10.8*  --  7.8*  --   HGB 13.7 13.7 12.5 13.3  HCT 45.1 45.0 40.6 43.2  MCV 94.4 93.6 94.4 93.5  PLT 309 343 295 317   Cardiac Enzymes: No results for input(s): "CKTOTAL", "CKMB", "CKMBINDEX", "TROPONINI" in the last 168 hours. BNP: Invalid input(s): "POCBNP" CBG: No results for input(s): "GLUCAP" in the last 168 hours. D-Dimer No results for input(s): "DDIMER" in the last 72 hours. Hgb A1c No results for input(s): "HGBA1C" in the last 72 hours. Lipid Profile No results for input(s): "CHOL", "HDL", "LDLCALC", "TRIG", "CHOLHDL", "LDLDIRECT" in the last 72 hours. Thyroid function studies No results for input(s): "TSH", "T4TOTAL", "T3FREE", "THYROIDAB" in the last 72 hours.  Invalid input(s): "FREET3" Anemia work up No results for input(s): "VITAMINB12", "FOLATE", "FERRITIN", "TIBC", "IRON", "RETICCTPCT" in the last 72 hours. Urinalysis    Component Value Date/Time   COLORURINE AMBER (A) 04/22/2022 0544   APPEARANCEUR CLEAR 04/22/2022 0544   LABSPEC 1.031 (H) 04/22/2022 0544   PHURINE 5.0 04/22/2022 0544   GLUCOSEU NEGATIVE  04/22/2022 0544   HGBUR NEGATIVE 04/22/2022 0544   HGBUR negative 01/05/2009 1414   BILIRUBINUR NEGATIVE 04/22/2022 0544   BILIRUBINUR negative 10/05/2020 1139   KETONESUR NEGATIVE 04/22/2022 0544   PROTEINUR NEGATIVE 04/22/2022 0544   UROBILINOGEN 0.2 10/05/2020 1139   UROBILINOGEN 0.2 01/05/2009 1414   NITRITE NEGATIVE 04/22/2022 0544   LEUKOCYTESUR NEGATIVE 04/22/2022 0544   Sepsis Labs Recent Labs  Lab 11/20/22 1902 11/21/22 0128 11/22/22 0047 11/23/22 0806  WBC 13.3* 18.6* 10.8* 9.5   Microbiology Recent Results (from the past 240 hour(s))  SARS Coronavirus 2 by RT PCR (hospital order, performed in [facility] hospital lab) *cepheid single result test* Anterior Nasal Swab     Status: None   Collection Time: 11/20/22  7:02 PM   Specimen: Anterior Nasal Swab  Result Value Ref Range Status   SARS Coronavirus 2 by RT PCR NEGATIVE NEGATIVE Final    Comment: Performed at [facility], [address] [place], [place] [address]     Patient was seen and examined on the day of discharge and was found to be in stable condition. Time coordinating discharge: 35 minutes including assessment and coordination of care, as well as examination of the patient.   SIGNED:  Noralee Stain, DO [facility] 11/24/2022, 11:03 AM

## 2022-11-25 ENCOUNTER — Telehealth: Payer: Self-pay

## 2022-11-25 NOTE — Transitions of Care (Post Inpatient/ED Visit) (Signed)
11/25/2022  Name: [name] MRN: [id number] DOB: 02-12-1971  Today's TOC FU Call Status: Today's TOC FU Call Status:: Successful TOC FU Call Competed TOC FU Call Complete Date: 11/25/22  Transition Care Management Follow-up Telephone Call Date of Discharge: 11/24/22 Discharge Facility: [facility]) Type of Discharge: Inpatient Admission Primary Inpatient Discharge Diagnosis:: "acute resp failure with hypoxia" How have you been since you were released from the hospital?: Better (Pt states she rested well last night. She is using CPAP at night.Pulse ox has been ranging in the 95-96%RA.  Denies any SOB or resp sxs. Appetite good. LBM yesterday.) Any questions or concerns?: No  Items Reviewed: Did you receive and understand the discharge instructions provided?: Yes Medications obtained,verified, and reconciled?: Yes (Medications Reviewed) Any new allergies since your discharge?: No Dietary orders reviewed?: Yes Type of Diet Ordered:: low salt/heart healthy Do you have support at home?: Yes People in Home: child(ren), adult Name of Support/Comfort Primary Source: son is main caregiver  Medications Reviewed Today: Medications Reviewed Today     Reviewed by Charlyn Minerva, RN (Registered Nurse) on 11/25/22 at 1210  Med List Status: <None>   Medication Order Taking? Sig Documenting Provider Last Dose Status Informant  albuterol (VENTOLIN HFA) 108 (90 Base) MCG/ACT inhaler [id number] Yes Inhale 2 puffs into the lungs every 6 (six) hours as needed for wheezing or shortness of breath. Sheliah Hatch, MD Taking Active Pharmacy Records  ALPRAZolam Prudy Feeler) 0.5 MG tablet [id number] Yes Take 1 tablet (0.5 mg total) by mouth 2 (two) times daily. Sheliah Hatch, MD Taking Active Pharmacy Records  ARIPiprazole (ABILIFY) 15 MG tablet [id number] Yes TAKE 1 TABLET BY MOUTH EVERY DAY Sheliah Hatch, MD Taking Active Pharmacy Records  Armodafinil 250 MG tablet [id number] Yes  Take 1 tablet (250 mg total) by mouth in the morning and at bedtime. Sheliah Hatch, MD Taking Active Pharmacy Records  Biotin 10 MG TABS [id number] Yes Take 1 tablet (10 mg total) by mouth in the morning. Sheliah Hatch, MD Taking Active Pharmacy Records  buPROPion (WELLBUTRIN XL) 300 MG 24 hr tablet [id number] Yes TAKE 1 TABLET BY MOUTH EVERY DAY Sheliah Hatch, MD Taking Active Pharmacy Records  calcium citrate (CALCITRATE - DOSED IN MG ELEMENTAL CALCIUM) 950 (200 Ca) MG tablet [id number] Yes Take 1 tablet (200 mg of elemental calcium total) by mouth daily. Sheliah Hatch, MD Taking Active Pharmacy Records  clonazePAM [facility]) 1 MG tablet [id number] Yes TAKE 1 TABLET BY MOUTH AT BEDTIME Sheliah Hatch, MD Taking Active Pharmacy Records  cyanocobalamin (VITAMIN B12) 1000 MCG tablet [id number] Yes TAKE 1 TABLET BY MOUTH EVERY DAY Sheliah Hatch, MD Taking Active Pharmacy Records  cyclobenzaprine (FLEXERIL) 10 MG tablet [id number] Yes TAKE 2 TABLETS BY MOUTH 2 TIMES DAILY Sheliah Hatch, MD Taking Active Pharmacy Records  diclofenac Sodium (VOLTAREN) 1 % GEL [id number] Yes Apply 4 g topically 4 (four) times daily. Sheliah Hatch, MD Taking Active Pharmacy Records  DULoxetine (CYMBALTA) 60 MG capsule [id number] Yes Take 2 capsules (120 mg total) by mouth daily.  Patient taking differently: Take 60 mg by mouth 2 (two) times daily. Pt reported taking two capsules(120mg ) once daily   Sheliah Hatch, MD Taking Active Pharmacy Records, Self  FEROSUL 325 [id number] Fe) MG tablet [id number] Yes TAKE 1 TABLET BY MOUTH EVERY MORNING WITH BREAKFAST Sheliah Hatch, MD Taking Active Pharmacy Records  fluticasone-salmeterol (ADVAIR DISKUS) 100-50 MCG/ACT AEPB [id number] Yes Inhale 1 puff into  the lungs 2 (two) times daily. Luciano Cutter, MD Taking Active Pharmacy Records  folic acid (FOLVITE) 1 MG tablet [id number] Yes TAKE 1 TABLET BY MOUTH EVERY DAY Sheliah Hatch, MD  Taking Active Pharmacy Records  furosemide (LASIX) 40 MG tablet [id number] Yes Take 1 tablet (40 mg total) by mouth daily. Noralee Stain, DO Taking Active   gabapentin (NEURONTIN) 600 MG tablet [id number] Yes Take 1 tablet (600 mg total) by mouth 2 (two) times daily as needed. Pt takes daily - can take up to twice daily Noralee Stain, DO Taking Active   GNP VITAMIN B-1 100 MG tablet [id number] Yes TAKE 1 TABLET BY MOUTH EVERY DAY Sheliah Hatch, MD Taking Active Pharmacy Records  HYDROcodone-acetaminophen [facility]) 10-325 MG tablet [id number] Yes TAKE 1 TABLET BY MOUTH EVERY 8 HOURS AS NEEDED Sheliah Hatch, MD Taking Active Pharmacy Records  nebivolol (BYSTOLIC) 10 MG tablet [id number] Yes TAKE 1 TABLET BY MOUTH EVERY DAY Sheliah Hatch, MD Taking Active Pharmacy Records  nystatin (MYCOSTATIN/NYSTOP) powder [id number] Yes APPLY 1 APPLICATION TOPICALLY 3 TIMES DAILY Sheliah Hatch, MD Taking Active Pharmacy Records  Oxcarbazepine (TRILEPTAL) 300 MG tablet [id number] Yes Take 300 mg by mouth 2 (two) times daily. [provider] Taking Active Multiple Informants, Pharmacy Records  pantoprazole (PROTONIX) 40 MG tablet [id number] Yes TAKE 1 TABLET BY MOUTH 2 TIMES DAILY Sheliah Hatch, MD Taking Active Pharmacy Records  phentermine 37.5 MG capsule [id number] Yes Take 1 capsule (37.5 mg total) by mouth every morning. Sheliah Hatch, MD Taking Active Pharmacy Records  potassium chloride (KLOR-CON) 20 MEQ packet [id number] Yes Take 1 packet by mouth daily. Take with lasix. Noralee Stain, DO Taking Active   promethazine (PHENERGAN) 25 MG tablet [id number] Yes TAKE 1 TABLET BY MOUTH EVERY 6 HOURS AS NEEDED FOR NAUSEA AND VOMITING Sheliah Hatch, MD Taking Active Pharmacy Records  pyridOXINE (VITAMIN B6) 100 MG tablet [id number] Yes TAKE 1 TABLET BY MOUTH EVERY DAY Sheliah Hatch, MD Taking Active Pharmacy Records  Med List Note Lenor Derrick, CPhT 06/04/21 1816): Son Cristal Deer [phone]            Home Care and Equipment/Supplies: Were Home Health Services Ordered?: Yes Name of Home Health Agency:: [facility]-pt had services prior to hospitalization-she will call them to let them know she is back home tto resume care Has Agency set up a time to come to your home?: No (pt aware to f/u with agency if she does not hear from them within 48-72hrs post discharge) Any new equipment or medical supplies ordered?: NA  Functional Questionnaire: Do you need assistance with bathing/showering or dressing?: Yes Do you need assistance with meal preparation?: Yes Do you need assistance with eating?: No Do you have difficulty maintaining continence: Yes Do you need assistance with getting out of bed/getting out of a chair/moving?: Yes Do you have difficulty managing or taking your medications?: Yes (pt states she gets meds in pill packs from pharmacy)  Follow up appointments reviewed: PCP Follow-up appointment confirmed?: Yes Date of PCP follow-up appointment?: 12/05/22 Follow-up Provider: Dr. Beverely Low Specialist [facility] Follow-up appointment confirmed?: NA Do you need transportation to your follow-up appointment?: No (pt uses [facility]) Do you understand care options if your condition(s) worsen?: Yes-patient verbalized understanding  SDOH Interventions Today    Flowsheet Row Most Recent Value  SDOH Interventions   Food Insecurity Interventions Intervention Not Indicated  Transportation Interventions Intervention Not Indicated  [pt states she uses [facility]]  TOC Interventions Today    Flowsheet Row Most Recent Value  TOC Interventions   TOC Interventions Discussed/Reviewed TOC Interventions Discussed      Interventions Today    Flowsheet Row Most Recent Value  General Interventions   General Interventions Discussed/Reviewed General Interventions Discussed, Doctor Visits, Durable Medical Equipment (DME)  Doctor Visits Discussed/Reviewed Doctor  Visits Discussed, PCP  Durable Medical Equipment (DME) Other  [pulse ox]  PCP/Specialist Visits Compliance with follow-up visit  Education Interventions   Education Provided Provided Education  Provided Verbal Education On Medication, When to see the doctor, Nutrition, Community Resources  [pt reports she is the process of trying to get Medicaid so she can get CAPs servcies -so her son can get paid to be her caregiver-she was given number to call while in hospital from [facility] left VM today & will f/u]  Nutrition Interventions   Nutrition Discussed/Reviewed Nutrition Discussed, Adding fruits and vegetables, Increasing proteins, Decreasing salt, Supplemental nutrition, Decreasing fats  Pharmacy Interventions   Pharmacy Dicussed/Reviewed Pharmacy Topics Discussed, Medications and their functions  Safety Interventions   Safety Discussed/Reviewed Safety Discussed, Home Safety  Home Safety Assistive Devices        [place], [place] [facility] Care Management Care Management Community Coordinator Direct Phone: [phone] Toll Free: [phone] Fax: [phone]

## 2022-11-28 NOTE — Consult Note (Signed)
[facility] Customer service manager [facility]) Accountable Care Organization (ACO) [facility] Liaison Note  11/28/2022  [name] [birth year] [id number]  Location: [facility] RN Hospital Liaison screened the patient remotely at [facility].  Insurance: [employer] (Dual)   [name] is a 52 y.o. female who is a Primary Care Patient of Sheliah Hatch, MD. The patient was screened for readmission hospitalization with noted medium risk score for unplanned readmission risk with 1 IP in 6 months.  The patient was assessed for potential [facility]) Care Management service needs for post hospital transition for care coordination. Review of patient's electronic medical record reveals patient was admitted for acute respiratory failure with hypoxia. Pt currently active with [employer] Dual (DSNP) in [place] and will be managed by DSNP program.   [facility] Care Management/Population Health does not replace or interfere with any arrangements made by the Inpatient Transition of Care team.   For questions contact:   Elliot Cousin, RN, [facility] Liaison [place]   Population Health Office Hours MTWF  8:00 am-6:00 pm Off on Thursday [phone] mobile [phone] [Office toll free line] Office Hours are M-F 8:30 - 5 pm 24 hour nurse advise line [phone] Concierge  [name]      [facility] CM Inpatient Consult   11/28/2022  [name] May 14, 1971 [id number]

## 2022-11-29 DIAGNOSIS — Z87440 Personal history of urinary (tract) infections: Secondary | ICD-10-CM | POA: Diagnosis not present

## 2022-11-29 DIAGNOSIS — L409 Psoriasis, unspecified: Secondary | ICD-10-CM | POA: Diagnosis not present

## 2022-11-29 DIAGNOSIS — M797 Fibromyalgia: Secondary | ICD-10-CM | POA: Diagnosis not present

## 2022-11-29 DIAGNOSIS — E785 Hyperlipidemia, unspecified: Secondary | ICD-10-CM | POA: Diagnosis not present

## 2022-11-29 DIAGNOSIS — M5136 Other intervertebral disc degeneration, lumbar region: Secondary | ICD-10-CM | POA: Diagnosis not present

## 2022-11-29 DIAGNOSIS — E538 Deficiency of other specified B group vitamins: Secondary | ICD-10-CM | POA: Diagnosis not present

## 2022-11-29 DIAGNOSIS — G4733 Obstructive sleep apnea (adult) (pediatric): Secondary | ICD-10-CM | POA: Diagnosis not present

## 2022-11-29 DIAGNOSIS — G43909 Migraine, unspecified, not intractable, without status migrainosus: Secondary | ICD-10-CM | POA: Diagnosis not present

## 2022-11-29 DIAGNOSIS — K760 Fatty (change of) liver, not elsewhere classified: Secondary | ICD-10-CM | POA: Diagnosis not present

## 2022-11-29 DIAGNOSIS — G9529 Other cord compression: Secondary | ICD-10-CM | POA: Diagnosis not present

## 2022-11-29 DIAGNOSIS — G629 Polyneuropathy, unspecified: Secondary | ICD-10-CM | POA: Diagnosis not present

## 2022-11-29 DIAGNOSIS — M300 Polyarteritis nodosa: Secondary | ICD-10-CM | POA: Diagnosis not present

## 2022-11-29 DIAGNOSIS — E559 Vitamin D deficiency, unspecified: Secondary | ICD-10-CM | POA: Diagnosis not present

## 2022-11-29 DIAGNOSIS — I10 Essential (primary) hypertension: Secondary | ICD-10-CM | POA: Diagnosis not present

## 2022-11-29 DIAGNOSIS — M4802 Spinal stenosis, cervical region: Secondary | ICD-10-CM | POA: Diagnosis not present

## 2022-11-29 DIAGNOSIS — F32A Depression, unspecified: Secondary | ICD-10-CM | POA: Diagnosis not present

## 2022-11-29 DIAGNOSIS — I872 Venous insufficiency (chronic) (peripheral): Secondary | ICD-10-CM | POA: Diagnosis not present

## 2022-11-29 DIAGNOSIS — K219 Gastro-esophageal reflux disease without esophagitis: Secondary | ICD-10-CM | POA: Diagnosis not present

## 2022-11-29 DIAGNOSIS — Z9181 History of falling: Secondary | ICD-10-CM | POA: Diagnosis not present

## 2022-12-01 ENCOUNTER — Encounter: Payer: Self-pay | Admitting: Family Medicine

## 2022-12-01 NOTE — Telephone Encounter (Signed)
Good morning  ,  Your visits are virtual .  Have a great day , Sheryle Hail ,RMA [employer]  [phone]  *My chart messages may take 48-72 hours to address once received. If you need anything sooner, please call our office at [phone]*

## 2022-12-02 ENCOUNTER — Telehealth: Payer: Self-pay | Admitting: Family Medicine

## 2022-12-02 NOTE — Telephone Encounter (Signed)
Pt wanted to be sure that her apts are virtual they are

## 2022-12-02 NOTE — Telephone Encounter (Signed)
Caller name: MARIESHA VENTURELLA  On DPR?: Yes  Call back number: [phone] (mobile)  Provider they see: Sheliah Hatch, MD  Reason for call:  Would like [facility] to call her

## 2022-12-05 ENCOUNTER — Encounter: Payer: Self-pay | Admitting: Family Medicine

## 2022-12-05 ENCOUNTER — Telehealth (INDEPENDENT_AMBULATORY_CARE_PROVIDER_SITE_OTHER): Payer: 59 | Admitting: Family Medicine

## 2022-12-05 ENCOUNTER — Other Ambulatory Visit: Payer: Self-pay | Admitting: Family Medicine

## 2022-12-05 DIAGNOSIS — N390 Urinary tract infection, site not specified: Secondary | ICD-10-CM | POA: Diagnosis not present

## 2022-12-05 DIAGNOSIS — I110 Hypertensive heart disease with heart failure: Secondary | ICD-10-CM | POA: Diagnosis not present

## 2022-12-05 DIAGNOSIS — M797 Fibromyalgia: Secondary | ICD-10-CM | POA: Diagnosis not present

## 2022-12-05 DIAGNOSIS — L409 Psoriasis, unspecified: Secondary | ICD-10-CM | POA: Diagnosis not present

## 2022-12-05 DIAGNOSIS — I5033 Acute on chronic diastolic (congestive) heart failure: Secondary | ICD-10-CM | POA: Diagnosis not present

## 2022-12-05 DIAGNOSIS — G4733 Obstructive sleep apnea (adult) (pediatric): Secondary | ICD-10-CM | POA: Diagnosis not present

## 2022-12-05 DIAGNOSIS — J45909 Unspecified asthma, uncomplicated: Secondary | ICD-10-CM | POA: Diagnosis not present

## 2022-12-05 DIAGNOSIS — M300 Polyarteritis nodosa: Secondary | ICD-10-CM | POA: Diagnosis not present

## 2022-12-05 DIAGNOSIS — J9601 Acute respiratory failure with hypoxia: Secondary | ICD-10-CM | POA: Diagnosis not present

## 2022-12-05 DIAGNOSIS — E559 Vitamin D deficiency, unspecified: Secondary | ICD-10-CM | POA: Diagnosis not present

## 2022-12-05 DIAGNOSIS — G43909 Migraine, unspecified, not intractable, without status migrainosus: Secondary | ICD-10-CM | POA: Diagnosis not present

## 2022-12-05 DIAGNOSIS — M5136 Other intervertebral disc degeneration, lumbar region: Secondary | ICD-10-CM | POA: Diagnosis not present

## 2022-12-05 DIAGNOSIS — Z8709 Personal history of other diseases of the respiratory system: Secondary | ICD-10-CM | POA: Diagnosis not present

## 2022-12-05 DIAGNOSIS — G9529 Other cord compression: Secondary | ICD-10-CM | POA: Diagnosis not present

## 2022-12-05 DIAGNOSIS — M4802 Spinal stenosis, cervical region: Secondary | ICD-10-CM | POA: Diagnosis not present

## 2022-12-05 DIAGNOSIS — G894 Chronic pain syndrome: Secondary | ICD-10-CM | POA: Diagnosis not present

## 2022-12-05 DIAGNOSIS — L03116 Cellulitis of left lower limb: Secondary | ICD-10-CM | POA: Diagnosis not present

## 2022-12-05 DIAGNOSIS — E538 Deficiency of other specified B group vitamins: Secondary | ICD-10-CM | POA: Diagnosis not present

## 2022-12-05 DIAGNOSIS — I872 Venous insufficiency (chronic) (peripheral): Secondary | ICD-10-CM | POA: Diagnosis not present

## 2022-12-05 DIAGNOSIS — E785 Hyperlipidemia, unspecified: Secondary | ICD-10-CM | POA: Diagnosis not present

## 2022-12-05 DIAGNOSIS — K219 Gastro-esophageal reflux disease without esophagitis: Secondary | ICD-10-CM | POA: Diagnosis not present

## 2022-12-05 DIAGNOSIS — F32A Depression, unspecified: Secondary | ICD-10-CM | POA: Diagnosis not present

## 2022-12-05 DIAGNOSIS — G629 Polyneuropathy, unspecified: Secondary | ICD-10-CM | POA: Diagnosis not present

## 2022-12-05 DIAGNOSIS — G9589 Other specified diseases of spinal cord: Secondary | ICD-10-CM

## 2022-12-05 DIAGNOSIS — K760 Fatty (change of) liver, not elsewhere classified: Secondary | ICD-10-CM | POA: Diagnosis not present

## 2022-12-05 NOTE — Progress Notes (Signed)
Virtual Visit via Video   I connected with patient on 12/05/22 at 11:00 AM EDT by a video enabled telemedicine application and verified that I am speaking with the correct person using two identifiers.  Location patient: Home Location provider: [place], Office Persons participating in the virtual visit: Patient, Provider, CMA Sheryle Hail C)  I discussed the limitations of evaluation and management by telemedicine and the availability of in person appointments. The patient expressed understanding and agreed to proceed.  Subjective:   HPI:   Hospital F/U- pt was admitted 7/7-7/11 w/ hypoxic respiratory failure.  She was able to diurese w/ IV Lasix and weaned to RA.  Her Lasix was switched to PO prior to d/c.  She also had lower leg cellulitis on admission and was tx'd w/ Dicloxacillin.  She completed this in the hospital.  WBC had resolved prior to d/c.  Pt reports breathing is better.  Feels that legs 'were never really an issue' and are doing well.  Doesn't feel that she needs O2- has a pulse ox and it's been running 93-98%.  Meds were reconciled.  Pt doesn't have any needs at this time.  ROS:   See pertinent positives and negatives per HPI.  Patient Active Problem List   Diagnosis Date Noted   Acute on chronic diastolic CHF (congestive heart failure) (HCC) 11/23/2022   Pressure injury of skin 11/23/2022   Nocturnal hypoxemia 06/21/2022   Elevated LFTs 05/06/2022   Obesity hypoventilation syndrome (HCC) 05/06/2022   Acute respiratory failure with hypoxia (HCC) 04/22/2022   Hypokalemia 04/22/2022   Leg cramps 01/12/2022   CAP (community acquired pneumonia) 08/12/2021   Fever of unknown origin 08/12/2021   Sacral pressure sore 07/06/2021   Vitamin B6 deficiency 06/08/2021   Myelomalacia of cervical cord (HCC) 06/06/2021   Compressive cervical cord myelomalacia (HCC) 06/05/2021   B12 deficiency 06/05/2021   Folate deficiency 06/05/2021   Vitamin D deficiency  06/05/2021   Leg weakness, bilateral 06/04/2021   Cervical atypia 01/06/2021   Steatosis of liver 01/06/2021   Hyponatremia 07/27/2019   Sepsis (HCC) 07/26/2019   Urinary urgency 04/04/2018   Chronic narcotic use 09/07/2016   Acute blood loss anemia 01/14/2016   Multiple gastric ulcers    PAN (polyarteritis nodosa) (HCC) 11/24/2015   GERD (gastroesophageal reflux disease) 01/08/2014   Routine general medical examination at a health care facility 04/23/2013   Bariatric surgery status 10/31/2012   S/P skin biopsy 10/31/2012   Psoriasis 01/09/2012   DDD (degenerative disc disease), lumbar 11/30/2011   Migraine headache    [name] (obstructive sleep apnea) 11/16/2010   HYPERGLYCEMIA, FASTING 10/08/2009   CONTRACTURE OF TENDON 08/17/2009   PARESTHESIA, HANDS 12/23/2008   Leg pain, right 11/05/2008   ADVERSE DRUG REACTION 04/03/2008   PERIPHERAL EDEMA 03/26/2008   Obesity, morbid, BMI 50 or higher (HCC) 01/03/2007   Cellulitis 01/03/2007   Hyperlipidemia 09/22/2006   Fibromyalgia 09/20/2006   Anxiety and depression 06/28/2006   Essential hypertension 06/28/2006    Social History   Tobacco Use   Smoking status: Never   Smokeless tobacco: Never  Substance Use Topics   Alcohol use: No    Current Outpatient Medications:    albuterol (VENTOLIN HFA) 108 (90 Base) MCG/ACT inhaler, Inhale 2 puffs into the lungs every 6 (six) hours as needed for wheezing or shortness of breath., Disp: 8 g, Rfl: 0   ALPRAZolam (XANAX) 0.5 MG tablet, Take 1 tablet (0.5 mg total) by mouth 2 (two) times daily., Disp: 60 tablet,  Rfl: 3   ARIPiprazole (ABILIFY) 15 MG tablet, TAKE 1 TABLET BY MOUTH EVERY DAY, Disp: 90 tablet, Rfl: 1   Armodafinil 250 MG tablet, Take 1 tablet (250 mg total) by mouth in the morning and at bedtime., Disp: 60 tablet, Rfl: 3   Biotin 10 MG TABS, Take 1 tablet (10 mg total) by mouth in the morning., Disp: 90 tablet, Rfl: 1   buPROPion (WELLBUTRIN XL) 300 MG 24 hr tablet, TAKE 1 TABLET  BY MOUTH EVERY DAY, Disp: 30 tablet, Rfl: 2   calcium citrate (CALCITRATE - DOSED IN MG ELEMENTAL CALCIUM) 950 (200 Ca) MG tablet, Take 1 tablet (200 mg of elemental calcium total) by mouth daily., Disp: 30 tablet, Rfl: 3   clonazePAM (KLONOPIN) 1 MG tablet, TAKE 1 TABLET BY MOUTH AT BEDTIME, Disp: 30 tablet, Rfl: 3   cyanocobalamin (VITAMIN B12) 1000 MCG tablet, TAKE 1 TABLET BY MOUTH EVERY DAY, Disp: 30 tablet, Rfl: 2   cyclobenzaprine (FLEXERIL) 10 MG tablet, TAKE 2 TABLETS BY MOUTH 2 TIMES DAILY, Disp: 120 tablet, Rfl: 2   diclofenac Sodium (VOLTAREN) 1 % GEL, Apply 4 g topically 4 (four) times daily., Disp: 1 g, Rfl: 1   DULoxetine (CYMBALTA) 60 MG capsule, Take 2 capsules (120 mg total) by mouth daily. (Patient taking differently: Take 60 mg by mouth 2 (two) times daily. Pt reported taking two capsules(120mg ) once daily), Disp: 240 capsule, Rfl: 2   FEROSUL 325 (65 Fe) MG tablet, TAKE 1 TABLET BY MOUTH EVERY MORNING WITH BREAKFAST, Disp: 30 tablet, Rfl: 2   fluticasone-salmeterol (ADVAIR DISKUS) 100-50 MCG/ACT AEPB, Inhale 1 puff into the lungs 2 (two) times daily., Disp: 60 each, Rfl: 5   folic acid (FOLVITE) 1 MG tablet, TAKE 1 TABLET BY MOUTH EVERY DAY, Disp: 30 tablet, Rfl: 2   furosemide (LASIX) 40 MG tablet, Take 1 tablet (40 mg total) by mouth daily., Disp: 30 tablet, Rfl: 1   gabapentin (NEURONTIN) 600 MG tablet, Take 1 tablet (600 mg total) by mouth 2 (two) times daily as needed. Pt takes daily - can take up to twice daily, Disp: 60 tablet, Rfl: 1   GNP VITAMIN B-1 100 MG tablet, TAKE 1 TABLET BY MOUTH EVERY DAY, Disp: 30 tablet, Rfl: 2   HYDROcodone-acetaminophen (NORCO) 10-325 MG tablet, TAKE 1 TABLET BY MOUTH EVERY 8 HOURS AS NEEDED, Disp: 90 tablet, Rfl: 0   nebivolol (BYSTOLIC) 10 MG tablet, TAKE 1 TABLET BY MOUTH EVERY DAY, Disp: 30 tablet, Rfl: 2   nystatin (MYCOSTATIN/NYSTOP) powder, APPLY 1 APPLICATION TOPICALLY 3 TIMES DAILY, Disp: 60 g, Rfl: 3   Oxcarbazepine (TRILEPTAL) 300  MG tablet, Take 300 mg by mouth 2 (two) times daily., Disp: , Rfl:    pantoprazole (PROTONIX) 40 MG tablet, TAKE 1 TABLET BY MOUTH 2 TIMES DAILY, Disp: 60 tablet, Rfl: 2   phentermine 37.5 MG capsule, Take 1 capsule (37.5 mg total) by mouth every morning., Disp: 90 capsule, Rfl: 0   potassium chloride (KLOR-CON) 20 MEQ packet, Take 1 packet by mouth daily. Take with lasix., Disp: 30 packet, Rfl: 1   promethazine (PHENERGAN) 25 MG tablet, TAKE 1 TABLET BY MOUTH EVERY 6 HOURS AS NEEDED FOR NAUSEA AND VOMITING, Disp: 45 tablet, Rfl: 3   pyridOXINE (VITAMIN B6) 100 MG tablet, TAKE 1 TABLET BY MOUTH EVERY DAY, Disp: 30 tablet, Rfl: 2  Allergies  Allergen Reactions   Niacin Anaphylaxis, Shortness Of Breath and Swelling    Swelling and "problems breathing"   Sulfamethoxazole-Trimethoprim Anaphylaxis, Swelling,  Rash and Other (See Comments)    Throat closed and the eyes became swollen   Aspirin Hives   Bactrim Hives   Benzoin Compound Rash   Cephalexin Hives    Tolerated ceftriaxone and cefepime 07/2019   Iohexol Hives    Pt treated with PO benedryl   Lisinopril Hives   Naproxen Hives   Pnu-Imune [Pneumococcal Polysaccharide Vaccine] Rash   Sulfonamide Derivatives Rash    Objective:   There were no vitals taken for this visit. AAOx3, NAD NCAT, EOMI No obvious CN deficits Coloring WNL Pt is able to speak clearly, coherently without shortness of breath or increased work of breathing.  Thought process is linear.  Mood is appropriate.   Assessment and Plan:   Acute Respiratory Failure w/ Hypoxia- resolved.  Pt is back to her previous baseline and sats are good at 93-98% on RA.  We reviewed her medications and she doesn't feel that she has any additional needs at this time.  Encouraged her to work on getting her stamina back w/ continued efforts at PT.  Will follow.   Neena Rhymes, MD 12/05/2022

## 2022-12-05 NOTE — Telephone Encounter (Signed)
Hydrocodone 10-325 mg LOV: 12/05/22 Last Refill:11/03/22 Upcoming appt: 12/08/22

## 2022-12-06 ENCOUNTER — Telehealth: Payer: Self-pay | Admitting: Family Medicine

## 2022-12-06 ENCOUNTER — Telehealth: Payer: Self-pay

## 2022-12-06 DIAGNOSIS — M5136 Other intervertebral disc degeneration, lumbar region: Secondary | ICD-10-CM | POA: Diagnosis not present

## 2022-12-06 DIAGNOSIS — G4733 Obstructive sleep apnea (adult) (pediatric): Secondary | ICD-10-CM | POA: Diagnosis not present

## 2022-12-06 DIAGNOSIS — G894 Chronic pain syndrome: Secondary | ICD-10-CM | POA: Diagnosis not present

## 2022-12-06 DIAGNOSIS — I110 Hypertensive heart disease with heart failure: Secondary | ICD-10-CM | POA: Diagnosis not present

## 2022-12-06 DIAGNOSIS — G629 Polyneuropathy, unspecified: Secondary | ICD-10-CM | POA: Diagnosis not present

## 2022-12-06 DIAGNOSIS — K219 Gastro-esophageal reflux disease without esophagitis: Secondary | ICD-10-CM | POA: Diagnosis not present

## 2022-12-06 DIAGNOSIS — M797 Fibromyalgia: Secondary | ICD-10-CM | POA: Diagnosis not present

## 2022-12-06 DIAGNOSIS — E785 Hyperlipidemia, unspecified: Secondary | ICD-10-CM | POA: Diagnosis not present

## 2022-12-06 DIAGNOSIS — M4802 Spinal stenosis, cervical region: Secondary | ICD-10-CM | POA: Diagnosis not present

## 2022-12-06 DIAGNOSIS — E559 Vitamin D deficiency, unspecified: Secondary | ICD-10-CM | POA: Diagnosis not present

## 2022-12-06 DIAGNOSIS — I872 Venous insufficiency (chronic) (peripheral): Secondary | ICD-10-CM | POA: Diagnosis not present

## 2022-12-06 DIAGNOSIS — G9529 Other cord compression: Secondary | ICD-10-CM | POA: Diagnosis not present

## 2022-12-06 DIAGNOSIS — L409 Psoriasis, unspecified: Secondary | ICD-10-CM | POA: Diagnosis not present

## 2022-12-06 DIAGNOSIS — F32A Depression, unspecified: Secondary | ICD-10-CM | POA: Diagnosis not present

## 2022-12-06 DIAGNOSIS — L03116 Cellulitis of left lower limb: Secondary | ICD-10-CM | POA: Diagnosis not present

## 2022-12-06 DIAGNOSIS — E538 Deficiency of other specified B group vitamins: Secondary | ICD-10-CM | POA: Diagnosis not present

## 2022-12-06 DIAGNOSIS — N390 Urinary tract infection, site not specified: Secondary | ICD-10-CM | POA: Diagnosis not present

## 2022-12-06 DIAGNOSIS — G43909 Migraine, unspecified, not intractable, without status migrainosus: Secondary | ICD-10-CM | POA: Diagnosis not present

## 2022-12-06 DIAGNOSIS — J45909 Unspecified asthma, uncomplicated: Secondary | ICD-10-CM | POA: Diagnosis not present

## 2022-12-06 DIAGNOSIS — M300 Polyarteritis nodosa: Secondary | ICD-10-CM | POA: Diagnosis not present

## 2022-12-06 DIAGNOSIS — J9601 Acute respiratory failure with hypoxia: Secondary | ICD-10-CM | POA: Diagnosis not present

## 2022-12-06 DIAGNOSIS — I5033 Acute on chronic diastolic (congestive) heart failure: Secondary | ICD-10-CM | POA: Diagnosis not present

## 2022-12-06 DIAGNOSIS — K760 Fatty (change of) liver, not elsewhere classified: Secondary | ICD-10-CM | POA: Diagnosis not present

## 2022-12-06 NOTE — Telephone Encounter (Signed)
Charge sheet attached and placed in front bin.

## 2022-12-06 NOTE — Telephone Encounter (Signed)
Pt called and states she forgot to mention while discussing in visit yesterday she was given potassium packets at the hospital and was advised to ask you for potassium pills   please advise.

## 2022-12-06 NOTE — Telephone Encounter (Signed)
Home Health Verbal Orders  Agency: [facility]   Caller: (Contact and title) Call back #: [phone] Fax #[phone]    Requesting OT/ PT/ Skilled nursing/ Social Work/ Speech:    Reason for Request:    Frequency:     HH needs F2F w/in last 30 days

## 2022-12-06 NOTE — Telephone Encounter (Signed)
Ok to give orders  °

## 2022-12-06 NOTE — Telephone Encounter (Signed)
Pt aware of refill.

## 2022-12-06 NOTE — Telephone Encounter (Signed)
Gave these forms to [place] ,[place]

## 2022-12-06 NOTE — Telephone Encounter (Signed)
I have informed pt  of the message . She expressed verbal understanding

## 2022-12-06 NOTE — Telephone Encounter (Signed)
Is it ok to give verbal orders for the following  Home Health Verbal Orders   Agency: [facility]    Caller: Delrae Rend- PT  Call back #: [phone] Fax #:[phone]     Requesting OT/ PT/ Skilled nursing/ Social Work/ Speech:

## 2022-12-06 NOTE — Telephone Encounter (Signed)
Forms placed in Dr Guss Bunde to be signed folder

## 2022-12-06 NOTE — Telephone Encounter (Signed)
I have spoke to [employer] at [employer] gave verbal orders for OT/PT./ skilled nursing / social work and speech

## 2022-12-06 NOTE — Telephone Encounter (Signed)
Home Health Verbal Orders  Agency: [facility]   Caller: Delrae Rend- PT  Call back #: [phone] Fax #:[phone]    Requesting OT/ PT/ Skilled nursing/ Social Work/ Speech:    Reason for Request:    Frequency:     HH needs F2F w/in last 30 days    **Charge sheet attached and placed in bin

## 2022-12-06 NOTE — Telephone Encounter (Signed)
I reviewed her labs.  Her potassium was low when she in the hospital (sick) but has otherwise always been normal.  I don't think she needs potassium pills at this time but we'll keep an eye on it.

## 2022-12-07 ENCOUNTER — Other Ambulatory Visit: Payer: Self-pay | Admitting: Family Medicine

## 2022-12-07 DIAGNOSIS — K219 Gastro-esophageal reflux disease without esophagitis: Secondary | ICD-10-CM | POA: Diagnosis not present

## 2022-12-07 DIAGNOSIS — I872 Venous insufficiency (chronic) (peripheral): Secondary | ICD-10-CM | POA: Diagnosis not present

## 2022-12-07 DIAGNOSIS — I5033 Acute on chronic diastolic (congestive) heart failure: Secondary | ICD-10-CM | POA: Diagnosis not present

## 2022-12-07 DIAGNOSIS — E785 Hyperlipidemia, unspecified: Secondary | ICD-10-CM | POA: Diagnosis not present

## 2022-12-07 DIAGNOSIS — G629 Polyneuropathy, unspecified: Secondary | ICD-10-CM | POA: Diagnosis not present

## 2022-12-07 DIAGNOSIS — L03116 Cellulitis of left lower limb: Secondary | ICD-10-CM | POA: Diagnosis not present

## 2022-12-07 DIAGNOSIS — J9601 Acute respiratory failure with hypoxia: Secondary | ICD-10-CM | POA: Diagnosis not present

## 2022-12-07 DIAGNOSIS — J45909 Unspecified asthma, uncomplicated: Secondary | ICD-10-CM | POA: Diagnosis not present

## 2022-12-07 DIAGNOSIS — M300 Polyarteritis nodosa: Secondary | ICD-10-CM | POA: Diagnosis not present

## 2022-12-07 DIAGNOSIS — G9529 Other cord compression: Secondary | ICD-10-CM | POA: Diagnosis not present

## 2022-12-07 DIAGNOSIS — G4733 Obstructive sleep apnea (adult) (pediatric): Secondary | ICD-10-CM | POA: Diagnosis not present

## 2022-12-07 DIAGNOSIS — N390 Urinary tract infection, site not specified: Secondary | ICD-10-CM | POA: Diagnosis not present

## 2022-12-07 DIAGNOSIS — L409 Psoriasis, unspecified: Secondary | ICD-10-CM | POA: Diagnosis not present

## 2022-12-07 DIAGNOSIS — M797 Fibromyalgia: Secondary | ICD-10-CM | POA: Diagnosis not present

## 2022-12-07 DIAGNOSIS — E559 Vitamin D deficiency, unspecified: Secondary | ICD-10-CM | POA: Diagnosis not present

## 2022-12-07 DIAGNOSIS — G894 Chronic pain syndrome: Secondary | ICD-10-CM | POA: Diagnosis not present

## 2022-12-07 DIAGNOSIS — M4802 Spinal stenosis, cervical region: Secondary | ICD-10-CM | POA: Diagnosis not present

## 2022-12-07 DIAGNOSIS — G43909 Migraine, unspecified, not intractable, without status migrainosus: Secondary | ICD-10-CM | POA: Diagnosis not present

## 2022-12-07 DIAGNOSIS — E538 Deficiency of other specified B group vitamins: Secondary | ICD-10-CM | POA: Diagnosis not present

## 2022-12-07 DIAGNOSIS — F32A Depression, unspecified: Secondary | ICD-10-CM | POA: Diagnosis not present

## 2022-12-07 DIAGNOSIS — I110 Hypertensive heart disease with heart failure: Secondary | ICD-10-CM | POA: Diagnosis not present

## 2022-12-07 DIAGNOSIS — K760 Fatty (change of) liver, not elsewhere classified: Secondary | ICD-10-CM | POA: Diagnosis not present

## 2022-12-07 DIAGNOSIS — M5136 Other intervertebral disc degeneration, lumbar region: Secondary | ICD-10-CM | POA: Diagnosis not present

## 2022-12-07 NOTE — Telephone Encounter (Signed)
Patient is requesting a refill of the following medications: Requested Prescriptions   Pending Prescriptions Disp Refills   clonazePAM (KLONOPIN) 1 MG tablet [Pharmacy Med Name: clonazepam 1 mg tablet] 30 tablet 3    Sig: TAKE 1 TABLET BY MOUTH AT BEDTIME    Date of patient request: 12/07/22 Last office visit: 12/05/22 Date of last refill: 07/20/22 Last refill amount: 30

## 2022-12-08 ENCOUNTER — Ambulatory Visit: Admit: 2022-12-08 | Payer: MEDICARE

## 2022-12-08 ENCOUNTER — Telehealth: Payer: 59 | Admitting: Family Medicine

## 2022-12-08 ENCOUNTER — Encounter: Payer: Self-pay | Admitting: Family Medicine

## 2022-12-09 NOTE — Telephone Encounter (Signed)
Forms faxed with charge sheet to billing, then faxed forms back to [facility] filed in the faxed folder

## 2022-12-09 NOTE — Telephone Encounter (Signed)
Forms completed and returned to [facility]

## 2022-12-12 DIAGNOSIS — M5136 Other intervertebral disc degeneration, lumbar region: Secondary | ICD-10-CM | POA: Diagnosis not present

## 2022-12-12 DIAGNOSIS — F32A Depression, unspecified: Secondary | ICD-10-CM | POA: Diagnosis not present

## 2022-12-12 DIAGNOSIS — K219 Gastro-esophageal reflux disease without esophagitis: Secondary | ICD-10-CM | POA: Diagnosis not present

## 2022-12-12 DIAGNOSIS — K760 Fatty (change of) liver, not elsewhere classified: Secondary | ICD-10-CM | POA: Diagnosis not present

## 2022-12-12 DIAGNOSIS — M4802 Spinal stenosis, cervical region: Secondary | ICD-10-CM | POA: Diagnosis not present

## 2022-12-12 DIAGNOSIS — M300 Polyarteritis nodosa: Secondary | ICD-10-CM | POA: Diagnosis not present

## 2022-12-12 DIAGNOSIS — E559 Vitamin D deficiency, unspecified: Secondary | ICD-10-CM | POA: Diagnosis not present

## 2022-12-12 DIAGNOSIS — M797 Fibromyalgia: Secondary | ICD-10-CM | POA: Diagnosis not present

## 2022-12-12 DIAGNOSIS — G894 Chronic pain syndrome: Secondary | ICD-10-CM | POA: Diagnosis not present

## 2022-12-12 DIAGNOSIS — L03116 Cellulitis of left lower limb: Secondary | ICD-10-CM | POA: Diagnosis not present

## 2022-12-12 DIAGNOSIS — G629 Polyneuropathy, unspecified: Secondary | ICD-10-CM | POA: Diagnosis not present

## 2022-12-12 DIAGNOSIS — G43909 Migraine, unspecified, not intractable, without status migrainosus: Secondary | ICD-10-CM | POA: Diagnosis not present

## 2022-12-12 DIAGNOSIS — J45909 Unspecified asthma, uncomplicated: Secondary | ICD-10-CM | POA: Diagnosis not present

## 2022-12-12 DIAGNOSIS — G4733 Obstructive sleep apnea (adult) (pediatric): Secondary | ICD-10-CM | POA: Diagnosis not present

## 2022-12-12 DIAGNOSIS — I5033 Acute on chronic diastolic (congestive) heart failure: Secondary | ICD-10-CM | POA: Diagnosis not present

## 2022-12-12 DIAGNOSIS — J9601 Acute respiratory failure with hypoxia: Secondary | ICD-10-CM | POA: Diagnosis not present

## 2022-12-12 DIAGNOSIS — I110 Hypertensive heart disease with heart failure: Secondary | ICD-10-CM | POA: Diagnosis not present

## 2022-12-12 DIAGNOSIS — N390 Urinary tract infection, site not specified: Secondary | ICD-10-CM | POA: Diagnosis not present

## 2022-12-12 DIAGNOSIS — L409 Psoriasis, unspecified: Secondary | ICD-10-CM | POA: Diagnosis not present

## 2022-12-12 DIAGNOSIS — E785 Hyperlipidemia, unspecified: Secondary | ICD-10-CM | POA: Diagnosis not present

## 2022-12-12 DIAGNOSIS — I872 Venous insufficiency (chronic) (peripheral): Secondary | ICD-10-CM | POA: Diagnosis not present

## 2022-12-12 DIAGNOSIS — E538 Deficiency of other specified B group vitamins: Secondary | ICD-10-CM | POA: Diagnosis not present

## 2022-12-12 DIAGNOSIS — G9529 Other cord compression: Secondary | ICD-10-CM | POA: Diagnosis not present

## 2022-12-12 NOTE — Assessment & Plan Note (Signed)
This was the cause of her sepsis.  She was tx'd w/ abx during her hospitalization 12/7-10 and then transitioned to PO Amox and Azithromycin.  She has completed both.  She reports feeling much better.

## 2022-12-12 NOTE — Assessment & Plan Note (Signed)
Pt is on CPAP.  If she has OHS, this may need to be adjusted or switched to bipap.  Refer to pulmonary.  Pt expressed understanding and is in agreement w/ plan.

## 2022-12-12 NOTE — Assessment & Plan Note (Signed)
Ongoing issue for pt.  Unfortunately she is mostly bed bound due to multiple medical issues and she is not able to lose weight.  During her recent hospitalization, they were concerned about obesity hypoventilation syndrome.  Will refer to Pulmonary for complete evaluation.  Pt expressed understanding and is in agreement w/ plan.

## 2022-12-12 NOTE — Assessment & Plan Note (Signed)
Resolved.  There was concern for obesity hypoventilation syndrome.  Refer to pulmonary for complete evaluation.

## 2022-12-12 NOTE — Assessment & Plan Note (Signed)
Thankfully resolved.  She completed her course of Amoxicillin and the Zpack.  No additional abx needed at this time

## 2022-12-12 NOTE — Assessment & Plan Note (Signed)
AST and ALT were as high was 165 and 117 respectively.  Likely due to some component of shock liver.  Thankfully they had decreased to 45 and 52 at d/c.  Repeat today to ensure they have not rebounded.

## 2022-12-14 ENCOUNTER — Encounter: Payer: Self-pay | Admitting: Family Medicine

## 2022-12-14 ENCOUNTER — Telehealth (INDEPENDENT_AMBULATORY_CARE_PROVIDER_SITE_OTHER): Payer: 59 | Admitting: Family Medicine

## 2022-12-14 ENCOUNTER — Telehealth: Payer: Self-pay | Admitting: Family Medicine

## 2022-12-14 VITALS — Ht 61.0 in | Wt 265.0 lb

## 2022-12-14 DIAGNOSIS — G9529 Other cord compression: Secondary | ICD-10-CM | POA: Diagnosis not present

## 2022-12-14 DIAGNOSIS — L03116 Cellulitis of left lower limb: Secondary | ICD-10-CM | POA: Diagnosis not present

## 2022-12-14 DIAGNOSIS — K219 Gastro-esophageal reflux disease without esophagitis: Secondary | ICD-10-CM | POA: Diagnosis not present

## 2022-12-14 DIAGNOSIS — I872 Venous insufficiency (chronic) (peripheral): Secondary | ICD-10-CM | POA: Diagnosis not present

## 2022-12-14 DIAGNOSIS — I110 Hypertensive heart disease with heart failure: Secondary | ICD-10-CM | POA: Diagnosis not present

## 2022-12-14 DIAGNOSIS — E559 Vitamin D deficiency, unspecified: Secondary | ICD-10-CM | POA: Diagnosis not present

## 2022-12-14 DIAGNOSIS — G9589 Other specified diseases of spinal cord: Secondary | ICD-10-CM | POA: Diagnosis not present

## 2022-12-14 DIAGNOSIS — N390 Urinary tract infection, site not specified: Secondary | ICD-10-CM | POA: Diagnosis not present

## 2022-12-14 DIAGNOSIS — J9601 Acute respiratory failure with hypoxia: Secondary | ICD-10-CM | POA: Diagnosis not present

## 2022-12-14 DIAGNOSIS — J45909 Unspecified asthma, uncomplicated: Secondary | ICD-10-CM | POA: Diagnosis not present

## 2022-12-14 DIAGNOSIS — M300 Polyarteritis nodosa: Secondary | ICD-10-CM | POA: Diagnosis not present

## 2022-12-14 DIAGNOSIS — Z7401 Bed confinement status: Secondary | ICD-10-CM | POA: Diagnosis not present

## 2022-12-14 DIAGNOSIS — F32A Depression, unspecified: Secondary | ICD-10-CM | POA: Diagnosis not present

## 2022-12-14 DIAGNOSIS — G894 Chronic pain syndrome: Secondary | ICD-10-CM | POA: Diagnosis not present

## 2022-12-14 DIAGNOSIS — E785 Hyperlipidemia, unspecified: Secondary | ICD-10-CM | POA: Diagnosis not present

## 2022-12-14 DIAGNOSIS — L89159 Pressure ulcer of sacral region, unspecified stage: Secondary | ICD-10-CM

## 2022-12-14 DIAGNOSIS — G629 Polyneuropathy, unspecified: Secondary | ICD-10-CM | POA: Diagnosis not present

## 2022-12-14 DIAGNOSIS — E538 Deficiency of other specified B group vitamins: Secondary | ICD-10-CM | POA: Diagnosis not present

## 2022-12-14 DIAGNOSIS — K760 Fatty (change of) liver, not elsewhere classified: Secondary | ICD-10-CM | POA: Diagnosis not present

## 2022-12-14 DIAGNOSIS — M4802 Spinal stenosis, cervical region: Secondary | ICD-10-CM | POA: Diagnosis not present

## 2022-12-14 DIAGNOSIS — G4733 Obstructive sleep apnea (adult) (pediatric): Secondary | ICD-10-CM | POA: Diagnosis not present

## 2022-12-14 DIAGNOSIS — G43909 Migraine, unspecified, not intractable, without status migrainosus: Secondary | ICD-10-CM | POA: Diagnosis not present

## 2022-12-14 DIAGNOSIS — M5136 Other intervertebral disc degeneration, lumbar region: Secondary | ICD-10-CM | POA: Diagnosis not present

## 2022-12-14 DIAGNOSIS — L409 Psoriasis, unspecified: Secondary | ICD-10-CM | POA: Diagnosis not present

## 2022-12-14 DIAGNOSIS — M797 Fibromyalgia: Secondary | ICD-10-CM | POA: Diagnosis not present

## 2022-12-14 DIAGNOSIS — I5033 Acute on chronic diastolic (congestive) heart failure: Secondary | ICD-10-CM | POA: Diagnosis not present

## 2022-12-14 NOTE — Telephone Encounter (Signed)
I would ask your physical therapist's opinion first.  I don't think it's a good idea b/c they say that anyone w/ numbness/tingling or loss of strength should proceed w/ caution

## 2022-12-14 NOTE — Telephone Encounter (Signed)
I have informed pt of DR Beverely Low   message. She will discuss with her PT also

## 2022-12-14 NOTE — Telephone Encounter (Signed)
Pt is asking if Dr Beverely Low thinks it would be safe and ok if she see's a Chiropractor ?

## 2022-12-14 NOTE — Telephone Encounter (Signed)
Caller name: LILLIAS STEEB  On DPR?: Yes  Call back number: [phone] (home)  Provider they see: Sheliah Hatch, MD  Reason for call:  Forgot a question and would like a call from either of you.

## 2022-12-14 NOTE — Progress Notes (Signed)
Virtual Visit via Video   I connected with patient on 12/14/22 at 10:20 AM EDT by a video enabled telemedicine application and verified that I am speaking with the correct person using two identifiers.  Location patient: Home Location provider: [facility], [facility] Persons participating in the virtual visit: Patient, Provider, CMA Sheryle Hail C)  I discussed the limitations of evaluation and management by telemedicine and the availability of in person appointments. The patient expressed understanding and agreed to proceed.  Subjective:   HPI:   Bed bound- pt's current hospital bed is broken and not able to be repaired.  Previous DME company could not provide new bed for whatever reason.  She is in need of a fully electric hospital bed that would facilitate changing positions more easily and safely.  This would also prevent bed sores or skin breakdown.  She also needs trapeze to assist w/ repositioning and getting to edge of bed for transfers.    ROS:   See pertinent positives and negatives per HPI.  Patient Active Problem List   Diagnosis Date Noted   Acute on chronic diastolic CHF (congestive heart failure) (HCC) 11/23/2022   Pressure injury of skin 11/23/2022   Nocturnal hypoxemia 06/21/2022   Elevated LFTs 05/06/2022   Obesity hypoventilation syndrome (HCC) 05/06/2022   Acute respiratory failure with hypoxia (HCC) 04/22/2022   Hypokalemia 04/22/2022   Leg cramps 01/12/2022   CAP (community acquired pneumonia) 08/12/2021   Fever of unknown origin 08/12/2021   Sacral pressure sore 07/06/2021   Vitamin B6 deficiency 06/08/2021   Myelomalacia of cervical cord (HCC) 06/06/2021   Compressive cervical cord myelomalacia (HCC) 06/05/2021   B12 deficiency 06/05/2021   Folate deficiency 06/05/2021   Vitamin D deficiency 06/05/2021   Leg weakness, bilateral 06/04/2021   Cervical atypia 01/06/2021   Steatosis of liver 01/06/2021   Hyponatremia 07/27/2019   Sepsis (HCC)  07/26/2019   Urinary urgency 04/04/2018   Chronic narcotic use 09/07/2016   Acute blood loss anemia 01/14/2016   Multiple gastric ulcers    PAN (polyarteritis nodosa) (HCC) 11/24/2015   GERD (gastroesophageal reflux disease) 01/08/2014   Routine general medical examination at a health care facility 04/23/2013   Bariatric surgery status 10/31/2012   S/P skin biopsy 10/31/2012   Psoriasis 01/09/2012   DDD (degenerative disc disease), lumbar 11/30/2011   Migraine headache    [name] (obstructive sleep apnea) 11/16/2010   HYPERGLYCEMIA, FASTING 10/08/2009   CONTRACTURE OF TENDON 08/17/2009   PARESTHESIA, HANDS 12/23/2008   Leg pain, right 11/05/2008   ADVERSE DRUG REACTION 04/03/2008   PERIPHERAL EDEMA 03/26/2008   Obesity, morbid, BMI 50 or higher (HCC) 01/03/2007   Cellulitis 01/03/2007   Hyperlipidemia 09/22/2006   Fibromyalgia 09/20/2006   Anxiety and depression 06/28/2006   Essential hypertension 06/28/2006    Social History   Tobacco Use   Smoking status: Never   Smokeless tobacco: Never  Substance Use Topics   Alcohol use: No    Current Outpatient Medications:    albuterol (VENTOLIN HFA) 108 (90 Base) MCG/ACT inhaler, Inhale 2 puffs into the lungs every 6 (six) hours as needed for wheezing or shortness of breath., Disp: 8 g, Rfl: 0   ALPRAZolam (XANAX) 0.5 MG tablet, Take 1 tablet (0.5 mg total) by mouth 2 (two) times daily., Disp: 60 tablet, Rfl: 3   ARIPiprazole (ABILIFY) 15 MG tablet, TAKE 1 TABLET BY MOUTH EVERY DAY, Disp: 90 tablet, Rfl: 1   Armodafinil 250 MG tablet, Take 1 tablet (250 mg total) by  mouth in the morning and at bedtime., Disp: 60 tablet, Rfl: 3   Biotin 10 MG TABS, Take 1 tablet (10 mg total) by mouth in the morning., Disp: 90 tablet, Rfl: 1   buPROPion (WELLBUTRIN XL) 300 MG 24 hr tablet, TAKE 1 TABLET BY MOUTH EVERY DAY, Disp: 30 tablet, Rfl: 2   calcium citrate (CALCITRATE - DOSED IN MG ELEMENTAL CALCIUM) 950 (200 Ca) MG tablet, Take 1 tablet (200 mg  of elemental calcium total) by mouth daily., Disp: 30 tablet, Rfl: 3   clonazePAM (KLONOPIN) 1 MG tablet, TAKE 1 TABLET BY MOUTH AT BEDTIME, Disp: 30 tablet, Rfl: 3   cyanocobalamin (VITAMIN B12) 1000 MCG tablet, TAKE 1 TABLET BY MOUTH EVERY DAY, Disp: 30 tablet, Rfl: 2   cyclobenzaprine (FLEXERIL) 10 MG tablet, TAKE 2 TABLETS BY MOUTH 2 TIMES DAILY, Disp: 120 tablet, Rfl: 2   diclofenac Sodium (VOLTAREN) 1 % GEL, Apply 4 g topically 4 (four) times daily., Disp: 1 g, Rfl: 1   DULoxetine (CYMBALTA) 60 MG capsule, Take 2 capsules (120 mg total) by mouth daily. (Patient taking differently: Take 60 mg by mouth 2 (two) times daily. Pt reported taking two capsules(120mg ) once daily), Disp: 240 capsule, Rfl: 2   FEROSUL 325 (65 Fe) MG tablet, TAKE 1 TABLET BY MOUTH EVERY MORNING WITH BREAKFAST, Disp: 30 tablet, Rfl: 2   fluticasone-salmeterol (ADVAIR DISKUS) 100-50 MCG/ACT AEPB, Inhale 1 puff into the lungs 2 (two) times daily., Disp: 60 each, Rfl: 5   folic acid (FOLVITE) 1 MG tablet, TAKE 1 TABLET BY MOUTH EVERY DAY, Disp: 30 tablet, Rfl: 2   furosemide (LASIX) 40 MG tablet, Take 1 tablet (40 mg total) by mouth daily., Disp: 30 tablet, Rfl: 1   gabapentin (NEURONTIN) 600 MG tablet, Take 1 tablet (600 mg total) by mouth 2 (two) times daily as needed. Pt takes daily - can take up to twice daily, Disp: 60 tablet, Rfl: 1   GNP VITAMIN B-1 100 MG tablet, TAKE 1 TABLET BY MOUTH EVERY DAY, Disp: 30 tablet, Rfl: 2   HYDROcodone-acetaminophen (NORCO) 10-325 MG tablet, TAKE 1 TABLET BY MOUTH EVERY 8 HOURS AS NEEDED, Disp: 90 tablet, Rfl: 0   nebivolol (BYSTOLIC) 10 MG tablet, TAKE 1 TABLET BY MOUTH EVERY DAY, Disp: 30 tablet, Rfl: 2   nystatin (MYCOSTATIN/NYSTOP) powder, APPLY 1 APPLICATION TOPICALLY 3 TIMES DAILY, Disp: 60 g, Rfl: 3   Oxcarbazepine (TRILEPTAL) 300 MG tablet, Take 300 mg by mouth 2 (two) times daily., Disp: , Rfl:    pantoprazole (PROTONIX) 40 MG tablet, TAKE 1 TABLET BY MOUTH 2 TIMES DAILY, Disp:  60 tablet, Rfl: 2   phentermine 37.5 MG capsule, Take 1 capsule (37.5 mg total) by mouth every morning., Disp: 90 capsule, Rfl: 0   potassium chloride (KLOR-CON) 20 MEQ packet, Take 1 packet by mouth daily. Take with lasix., Disp: 30 packet, Rfl: 1   promethazine (PHENERGAN) 25 MG tablet, TAKE 1 TABLET BY MOUTH EVERY 6 HOURS AS NEEDED FOR NAUSEA AND VOMITING, Disp: 45 tablet, Rfl: 3   pyridOXINE (VITAMIN B6) 100 MG tablet, TAKE 1 TABLET BY MOUTH EVERY DAY, Disp: 30 tablet, Rfl: 2  Allergies  Allergen Reactions   Niacin Anaphylaxis, Shortness Of Breath and Swelling    Swelling and "problems breathing"   Sulfamethoxazole-Trimethoprim Anaphylaxis, Swelling, Rash and Other (See Comments)    Throat closed and the eyes became swollen   Aspirin Hives   Bactrim Hives   Benzoin Compound Rash   Cephalexin Hives  Tolerated ceftriaxone and cefepime 07/2019   Iohexol Hives    Pt treated with PO benedryl   Lisinopril Hives   Naproxen Hives   Pnu-Imune [Pneumococcal Polysaccharide Vaccine] Rash   Sulfonamide Derivatives Rash    Objective:   Ht 5\' 1"  (1.549 m)   Wt 265 lb (120.2 kg)   BMI 50.07 kg/m   AAOx3, NAD NCAT, EOMI No obvious CN deficits Coloring WNL Pt is able to speak clearly, coherently without shortness of breath or increased work of breathing.  Thought process is linear.  Mood is appropriate.   Assessment and Plan:   Bedbound, cervical myelomalacia, pressure sore- ongoing issues for pt.  The current bed that she has is broken and does not go up or down.  She needs a fully electric bed w/ gel overlay to help w/ repositioning to prevent skin breakdown.  She needs a trapeze to help her w/ repositioning and getting to the edge of the bed to transfer.  This order was placed months ago but previous DME provider would not submit to insurance in order for pt to get properly working equipment.  New order provided.   Neena Rhymes, MD 12/14/2022

## 2022-12-15 ENCOUNTER — Telehealth: Payer: Self-pay | Admitting: Family Medicine

## 2022-12-15 DIAGNOSIS — E785 Hyperlipidemia, unspecified: Secondary | ICD-10-CM | POA: Diagnosis not present

## 2022-12-15 DIAGNOSIS — G43909 Migraine, unspecified, not intractable, without status migrainosus: Secondary | ICD-10-CM | POA: Diagnosis not present

## 2022-12-15 DIAGNOSIS — M5136 Other intervertebral disc degeneration, lumbar region: Secondary | ICD-10-CM | POA: Diagnosis not present

## 2022-12-15 DIAGNOSIS — K219 Gastro-esophageal reflux disease without esophagitis: Secondary | ICD-10-CM | POA: Diagnosis not present

## 2022-12-15 DIAGNOSIS — G629 Polyneuropathy, unspecified: Secondary | ICD-10-CM | POA: Diagnosis not present

## 2022-12-15 DIAGNOSIS — E538 Deficiency of other specified B group vitamins: Secondary | ICD-10-CM | POA: Diagnosis not present

## 2022-12-15 DIAGNOSIS — I5033 Acute on chronic diastolic (congestive) heart failure: Secondary | ICD-10-CM | POA: Diagnosis not present

## 2022-12-15 DIAGNOSIS — I872 Venous insufficiency (chronic) (peripheral): Secondary | ICD-10-CM | POA: Diagnosis not present

## 2022-12-15 DIAGNOSIS — L03116 Cellulitis of left lower limb: Secondary | ICD-10-CM | POA: Diagnosis not present

## 2022-12-15 DIAGNOSIS — M797 Fibromyalgia: Secondary | ICD-10-CM | POA: Diagnosis not present

## 2022-12-15 DIAGNOSIS — J45909 Unspecified asthma, uncomplicated: Secondary | ICD-10-CM | POA: Diagnosis not present

## 2022-12-15 DIAGNOSIS — G4733 Obstructive sleep apnea (adult) (pediatric): Secondary | ICD-10-CM | POA: Diagnosis not present

## 2022-12-15 DIAGNOSIS — F32A Depression, unspecified: Secondary | ICD-10-CM | POA: Diagnosis not present

## 2022-12-15 DIAGNOSIS — L409 Psoriasis, unspecified: Secondary | ICD-10-CM | POA: Diagnosis not present

## 2022-12-15 DIAGNOSIS — J9601 Acute respiratory failure with hypoxia: Secondary | ICD-10-CM | POA: Diagnosis not present

## 2022-12-15 DIAGNOSIS — G9529 Other cord compression: Secondary | ICD-10-CM | POA: Diagnosis not present

## 2022-12-15 DIAGNOSIS — N390 Urinary tract infection, site not specified: Secondary | ICD-10-CM | POA: Diagnosis not present

## 2022-12-15 DIAGNOSIS — M4802 Spinal stenosis, cervical region: Secondary | ICD-10-CM | POA: Diagnosis not present

## 2022-12-15 DIAGNOSIS — M300 Polyarteritis nodosa: Secondary | ICD-10-CM | POA: Diagnosis not present

## 2022-12-15 DIAGNOSIS — E559 Vitamin D deficiency, unspecified: Secondary | ICD-10-CM | POA: Diagnosis not present

## 2022-12-15 DIAGNOSIS — G894 Chronic pain syndrome: Secondary | ICD-10-CM | POA: Diagnosis not present

## 2022-12-15 DIAGNOSIS — I110 Hypertensive heart disease with heart failure: Secondary | ICD-10-CM | POA: Diagnosis not present

## 2022-12-15 DIAGNOSIS — K760 Fatty (change of) liver, not elsewhere classified: Secondary | ICD-10-CM | POA: Diagnosis not present

## 2022-12-15 NOTE — Telephone Encounter (Signed)
Ok to order RN visit to assess legs

## 2022-12-15 NOTE — Telephone Encounter (Signed)
Is it ok to order  Requesting RN:     Redness and warn to the touch L lower leg

## 2022-12-15 NOTE — Telephone Encounter (Signed)
Home Health Verbal Orders  Agency:  [facility]   Caller: [name]  Call back #: [phone] Fax #:    Requesting RN:    Redness and warn to the touch L lower leg   HH needs F2F w/in last 30 days

## 2022-12-15 NOTE — Telephone Encounter (Signed)
I have spoke to [facility] and gave her the verbal order to assess legs. [name] states the pt does not have any wounds but it is warm to the touch and looks like cellulitis . I have instructed the nurse [name] and if the redness has come outside the mark she is to call our office

## 2022-12-16 DIAGNOSIS — I872 Venous insufficiency (chronic) (peripheral): Secondary | ICD-10-CM | POA: Diagnosis not present

## 2022-12-16 DIAGNOSIS — M4802 Spinal stenosis, cervical region: Secondary | ICD-10-CM | POA: Diagnosis not present

## 2022-12-16 DIAGNOSIS — I5033 Acute on chronic diastolic (congestive) heart failure: Secondary | ICD-10-CM | POA: Diagnosis not present

## 2022-12-16 DIAGNOSIS — M300 Polyarteritis nodosa: Secondary | ICD-10-CM | POA: Diagnosis not present

## 2022-12-16 DIAGNOSIS — K760 Fatty (change of) liver, not elsewhere classified: Secondary | ICD-10-CM | POA: Diagnosis not present

## 2022-12-16 DIAGNOSIS — G9529 Other cord compression: Secondary | ICD-10-CM | POA: Diagnosis not present

## 2022-12-16 DIAGNOSIS — J9601 Acute respiratory failure with hypoxia: Secondary | ICD-10-CM | POA: Diagnosis not present

## 2022-12-16 DIAGNOSIS — N390 Urinary tract infection, site not specified: Secondary | ICD-10-CM | POA: Diagnosis not present

## 2022-12-16 DIAGNOSIS — E538 Deficiency of other specified B group vitamins: Secondary | ICD-10-CM | POA: Diagnosis not present

## 2022-12-16 DIAGNOSIS — G629 Polyneuropathy, unspecified: Secondary | ICD-10-CM | POA: Diagnosis not present

## 2022-12-16 DIAGNOSIS — F32A Depression, unspecified: Secondary | ICD-10-CM | POA: Diagnosis not present

## 2022-12-16 DIAGNOSIS — L03116 Cellulitis of left lower limb: Secondary | ICD-10-CM | POA: Diagnosis not present

## 2022-12-16 DIAGNOSIS — K219 Gastro-esophageal reflux disease without esophagitis: Secondary | ICD-10-CM | POA: Diagnosis not present

## 2022-12-16 DIAGNOSIS — M5136 Other intervertebral disc degeneration, lumbar region: Secondary | ICD-10-CM | POA: Diagnosis not present

## 2022-12-16 DIAGNOSIS — G4733 Obstructive sleep apnea (adult) (pediatric): Secondary | ICD-10-CM | POA: Diagnosis not present

## 2022-12-16 DIAGNOSIS — M797 Fibromyalgia: Secondary | ICD-10-CM | POA: Diagnosis not present

## 2022-12-16 DIAGNOSIS — I110 Hypertensive heart disease with heart failure: Secondary | ICD-10-CM | POA: Diagnosis not present

## 2022-12-16 DIAGNOSIS — E785 Hyperlipidemia, unspecified: Secondary | ICD-10-CM | POA: Diagnosis not present

## 2022-12-16 DIAGNOSIS — E559 Vitamin D deficiency, unspecified: Secondary | ICD-10-CM | POA: Diagnosis not present

## 2022-12-16 DIAGNOSIS — G43909 Migraine, unspecified, not intractable, without status migrainosus: Secondary | ICD-10-CM | POA: Diagnosis not present

## 2022-12-16 DIAGNOSIS — G894 Chronic pain syndrome: Secondary | ICD-10-CM | POA: Diagnosis not present

## 2022-12-16 DIAGNOSIS — L409 Psoriasis, unspecified: Secondary | ICD-10-CM | POA: Diagnosis not present

## 2022-12-16 DIAGNOSIS — J45909 Unspecified asthma, uncomplicated: Secondary | ICD-10-CM | POA: Diagnosis not present

## 2022-12-16 MED ORDER — DOXYCYCLINE HYCLATE 100 MG PO TABS: 100.0000 mg | ORAL_TABLET | Freq: Two times a day (BID) | ORAL | 0 refills | Status: DC

## 2022-12-16 NOTE — Addendum Note (Signed)
Addended by: Sheliah Hatch on: 12/16/2022 01:53 PM   Modules accepted: Orders

## 2022-12-19 ENCOUNTER — Ambulatory Visit: Payer: 59

## 2022-12-19 DIAGNOSIS — L409 Psoriasis, unspecified: Secondary | ICD-10-CM | POA: Diagnosis not present

## 2022-12-19 DIAGNOSIS — J9601 Acute respiratory failure with hypoxia: Secondary | ICD-10-CM | POA: Diagnosis not present

## 2022-12-19 DIAGNOSIS — L03116 Cellulitis of left lower limb: Secondary | ICD-10-CM | POA: Diagnosis not present

## 2022-12-19 DIAGNOSIS — I872 Venous insufficiency (chronic) (peripheral): Secondary | ICD-10-CM | POA: Diagnosis not present

## 2022-12-19 DIAGNOSIS — F32A Depression, unspecified: Secondary | ICD-10-CM | POA: Diagnosis not present

## 2022-12-19 DIAGNOSIS — G9529 Other cord compression: Secondary | ICD-10-CM | POA: Diagnosis not present

## 2022-12-19 DIAGNOSIS — M300 Polyarteritis nodosa: Secondary | ICD-10-CM | POA: Diagnosis not present

## 2022-12-19 DIAGNOSIS — G43909 Migraine, unspecified, not intractable, without status migrainosus: Secondary | ICD-10-CM | POA: Diagnosis not present

## 2022-12-19 DIAGNOSIS — G629 Polyneuropathy, unspecified: Secondary | ICD-10-CM | POA: Diagnosis not present

## 2022-12-19 DIAGNOSIS — K219 Gastro-esophageal reflux disease without esophagitis: Secondary | ICD-10-CM | POA: Diagnosis not present

## 2022-12-19 DIAGNOSIS — E785 Hyperlipidemia, unspecified: Secondary | ICD-10-CM | POA: Diagnosis not present

## 2022-12-19 DIAGNOSIS — M797 Fibromyalgia: Secondary | ICD-10-CM | POA: Diagnosis not present

## 2022-12-19 DIAGNOSIS — G894 Chronic pain syndrome: Secondary | ICD-10-CM | POA: Diagnosis not present

## 2022-12-19 DIAGNOSIS — N390 Urinary tract infection, site not specified: Secondary | ICD-10-CM | POA: Diagnosis not present

## 2022-12-19 DIAGNOSIS — E559 Vitamin D deficiency, unspecified: Secondary | ICD-10-CM | POA: Diagnosis not present

## 2022-12-19 DIAGNOSIS — E538 Deficiency of other specified B group vitamins: Secondary | ICD-10-CM | POA: Diagnosis not present

## 2022-12-19 DIAGNOSIS — I110 Hypertensive heart disease with heart failure: Secondary | ICD-10-CM | POA: Diagnosis not present

## 2022-12-19 DIAGNOSIS — M4802 Spinal stenosis, cervical region: Secondary | ICD-10-CM | POA: Diagnosis not present

## 2022-12-19 DIAGNOSIS — J45909 Unspecified asthma, uncomplicated: Secondary | ICD-10-CM | POA: Diagnosis not present

## 2022-12-19 DIAGNOSIS — I5033 Acute on chronic diastolic (congestive) heart failure: Secondary | ICD-10-CM | POA: Diagnosis not present

## 2022-12-19 DIAGNOSIS — G4733 Obstructive sleep apnea (adult) (pediatric): Secondary | ICD-10-CM | POA: Diagnosis not present

## 2022-12-19 DIAGNOSIS — M5136 Other intervertebral disc degeneration, lumbar region: Secondary | ICD-10-CM | POA: Diagnosis not present

## 2022-12-19 DIAGNOSIS — K760 Fatty (change of) liver, not elsewhere classified: Secondary | ICD-10-CM | POA: Diagnosis not present

## 2022-12-20 ENCOUNTER — Telehealth: Payer: Self-pay | Admitting: Family Medicine

## 2022-12-20 NOTE — Telephone Encounter (Signed)
Placed in review folder.

## 2022-12-20 NOTE — Telephone Encounter (Signed)
Received forms from [facility] Printed & placed in provider bin

## 2022-12-21 ENCOUNTER — Telehealth: Payer: Self-pay | Admitting: Family Medicine

## 2022-12-21 NOTE — Telephone Encounter (Signed)
Received forms from [facility] Printed & placed in provider bin

## 2022-12-21 NOTE — Telephone Encounter (Signed)
Forms placed in Dr [name] to be signed folder  

## 2022-12-22 DIAGNOSIS — L409 Psoriasis, unspecified: Secondary | ICD-10-CM

## 2022-12-22 DIAGNOSIS — Z8701 Personal history of pneumonia (recurrent): Secondary | ICD-10-CM

## 2022-12-22 DIAGNOSIS — M300 Polyarteritis nodosa: Secondary | ICD-10-CM | POA: Diagnosis not present

## 2022-12-22 DIAGNOSIS — E538 Deficiency of other specified B group vitamins: Secondary | ICD-10-CM | POA: Diagnosis not present

## 2022-12-22 DIAGNOSIS — J45909 Unspecified asthma, uncomplicated: Secondary | ICD-10-CM

## 2022-12-22 DIAGNOSIS — N390 Urinary tract infection, site not specified: Secondary | ICD-10-CM

## 2022-12-22 DIAGNOSIS — M5136 Other intervertebral disc degeneration, lumbar region: Secondary | ICD-10-CM | POA: Diagnosis not present

## 2022-12-22 DIAGNOSIS — F419 Anxiety disorder, unspecified: Secondary | ICD-10-CM

## 2022-12-22 DIAGNOSIS — K760 Fatty (change of) liver, not elsewhere classified: Secondary | ICD-10-CM

## 2022-12-22 DIAGNOSIS — G43909 Migraine, unspecified, not intractable, without status migrainosus: Secondary | ICD-10-CM

## 2022-12-22 DIAGNOSIS — G629 Polyneuropathy, unspecified: Secondary | ICD-10-CM | POA: Diagnosis not present

## 2022-12-22 DIAGNOSIS — L03116 Cellulitis of left lower limb: Secondary | ICD-10-CM

## 2022-12-22 DIAGNOSIS — I5033 Acute on chronic diastolic (congestive) heart failure: Secondary | ICD-10-CM | POA: Diagnosis not present

## 2022-12-22 DIAGNOSIS — J9601 Acute respiratory failure with hypoxia: Secondary | ICD-10-CM | POA: Diagnosis not present

## 2022-12-22 DIAGNOSIS — I110 Hypertensive heart disease with heart failure: Secondary | ICD-10-CM | POA: Diagnosis not present

## 2022-12-22 DIAGNOSIS — M4802 Spinal stenosis, cervical region: Secondary | ICD-10-CM | POA: Diagnosis not present

## 2022-12-22 DIAGNOSIS — E785 Hyperlipidemia, unspecified: Secondary | ICD-10-CM

## 2022-12-22 DIAGNOSIS — K219 Gastro-esophageal reflux disease without esophagitis: Secondary | ICD-10-CM

## 2022-12-22 DIAGNOSIS — F32A Depression, unspecified: Secondary | ICD-10-CM

## 2022-12-22 DIAGNOSIS — G894 Chronic pain syndrome: Secondary | ICD-10-CM

## 2022-12-22 DIAGNOSIS — M797 Fibromyalgia: Secondary | ICD-10-CM | POA: Diagnosis not present

## 2022-12-22 DIAGNOSIS — E559 Vitamin D deficiency, unspecified: Secondary | ICD-10-CM

## 2022-12-22 DIAGNOSIS — G9529 Other cord compression: Secondary | ICD-10-CM | POA: Diagnosis not present

## 2022-12-22 DIAGNOSIS — I872 Venous insufficiency (chronic) (peripheral): Secondary | ICD-10-CM | POA: Diagnosis not present

## 2022-12-22 DIAGNOSIS — G4733 Obstructive sleep apnea (adult) (pediatric): Secondary | ICD-10-CM

## 2022-12-22 NOTE — Telephone Encounter (Signed)
Form completed and returned to [place] 

## 2022-12-24 DIAGNOSIS — E559 Vitamin D deficiency, unspecified: Secondary | ICD-10-CM | POA: Diagnosis not present

## 2022-12-24 DIAGNOSIS — I872 Venous insufficiency (chronic) (peripheral): Secondary | ICD-10-CM | POA: Diagnosis not present

## 2022-12-24 DIAGNOSIS — L409 Psoriasis, unspecified: Secondary | ICD-10-CM | POA: Diagnosis not present

## 2022-12-24 DIAGNOSIS — M300 Polyarteritis nodosa: Secondary | ICD-10-CM | POA: Diagnosis not present

## 2022-12-24 DIAGNOSIS — I5033 Acute on chronic diastolic (congestive) heart failure: Secondary | ICD-10-CM | POA: Diagnosis not present

## 2022-12-24 DIAGNOSIS — L03116 Cellulitis of left lower limb: Secondary | ICD-10-CM | POA: Diagnosis not present

## 2022-12-24 DIAGNOSIS — G4733 Obstructive sleep apnea (adult) (pediatric): Secondary | ICD-10-CM | POA: Diagnosis not present

## 2022-12-24 DIAGNOSIS — I110 Hypertensive heart disease with heart failure: Secondary | ICD-10-CM | POA: Diagnosis not present

## 2022-12-24 DIAGNOSIS — M4802 Spinal stenosis, cervical region: Secondary | ICD-10-CM | POA: Diagnosis not present

## 2022-12-24 DIAGNOSIS — G894 Chronic pain syndrome: Secondary | ICD-10-CM | POA: Diagnosis not present

## 2022-12-24 DIAGNOSIS — G43909 Migraine, unspecified, not intractable, without status migrainosus: Secondary | ICD-10-CM | POA: Diagnosis not present

## 2022-12-24 DIAGNOSIS — G629 Polyneuropathy, unspecified: Secondary | ICD-10-CM | POA: Diagnosis not present

## 2022-12-24 DIAGNOSIS — G9529 Other cord compression: Secondary | ICD-10-CM | POA: Diagnosis not present

## 2022-12-24 DIAGNOSIS — N390 Urinary tract infection, site not specified: Secondary | ICD-10-CM | POA: Diagnosis not present

## 2022-12-24 DIAGNOSIS — K760 Fatty (change of) liver, not elsewhere classified: Secondary | ICD-10-CM | POA: Diagnosis not present

## 2022-12-24 DIAGNOSIS — E785 Hyperlipidemia, unspecified: Secondary | ICD-10-CM | POA: Diagnosis not present

## 2022-12-24 DIAGNOSIS — K219 Gastro-esophageal reflux disease without esophagitis: Secondary | ICD-10-CM | POA: Diagnosis not present

## 2022-12-24 DIAGNOSIS — F32A Depression, unspecified: Secondary | ICD-10-CM | POA: Diagnosis not present

## 2022-12-24 DIAGNOSIS — J9601 Acute respiratory failure with hypoxia: Secondary | ICD-10-CM | POA: Diagnosis not present

## 2022-12-24 DIAGNOSIS — E538 Deficiency of other specified B group vitamins: Secondary | ICD-10-CM | POA: Diagnosis not present

## 2022-12-24 DIAGNOSIS — J45909 Unspecified asthma, uncomplicated: Secondary | ICD-10-CM | POA: Diagnosis not present

## 2022-12-24 DIAGNOSIS — M797 Fibromyalgia: Secondary | ICD-10-CM | POA: Diagnosis not present

## 2022-12-24 DIAGNOSIS — M5136 Other intervertebral disc degeneration, lumbar region: Secondary | ICD-10-CM | POA: Diagnosis not present

## 2022-12-26 ENCOUNTER — Telehealth: Payer: Self-pay | Admitting: Family Medicine

## 2022-12-26 DIAGNOSIS — N390 Urinary tract infection, site not specified: Secondary | ICD-10-CM | POA: Diagnosis not present

## 2022-12-26 DIAGNOSIS — G629 Polyneuropathy, unspecified: Secondary | ICD-10-CM | POA: Diagnosis not present

## 2022-12-26 DIAGNOSIS — J45909 Unspecified asthma, uncomplicated: Secondary | ICD-10-CM | POA: Diagnosis not present

## 2022-12-26 DIAGNOSIS — I110 Hypertensive heart disease with heart failure: Secondary | ICD-10-CM | POA: Diagnosis not present

## 2022-12-26 DIAGNOSIS — J9601 Acute respiratory failure with hypoxia: Secondary | ICD-10-CM | POA: Diagnosis not present

## 2022-12-26 DIAGNOSIS — E538 Deficiency of other specified B group vitamins: Secondary | ICD-10-CM | POA: Diagnosis not present

## 2022-12-26 DIAGNOSIS — F32A Depression, unspecified: Secondary | ICD-10-CM | POA: Diagnosis not present

## 2022-12-26 DIAGNOSIS — M4802 Spinal stenosis, cervical region: Secondary | ICD-10-CM | POA: Diagnosis not present

## 2022-12-26 DIAGNOSIS — E559 Vitamin D deficiency, unspecified: Secondary | ICD-10-CM | POA: Diagnosis not present

## 2022-12-26 DIAGNOSIS — M797 Fibromyalgia: Secondary | ICD-10-CM | POA: Diagnosis not present

## 2022-12-26 DIAGNOSIS — G9529 Other cord compression: Secondary | ICD-10-CM | POA: Diagnosis not present

## 2022-12-26 DIAGNOSIS — G894 Chronic pain syndrome: Secondary | ICD-10-CM | POA: Diagnosis not present

## 2022-12-26 DIAGNOSIS — K219 Gastro-esophageal reflux disease without esophagitis: Secondary | ICD-10-CM | POA: Diagnosis not present

## 2022-12-26 DIAGNOSIS — E785 Hyperlipidemia, unspecified: Secondary | ICD-10-CM | POA: Diagnosis not present

## 2022-12-26 DIAGNOSIS — G4733 Obstructive sleep apnea (adult) (pediatric): Secondary | ICD-10-CM | POA: Diagnosis not present

## 2022-12-26 DIAGNOSIS — K760 Fatty (change of) liver, not elsewhere classified: Secondary | ICD-10-CM | POA: Diagnosis not present

## 2022-12-26 DIAGNOSIS — I872 Venous insufficiency (chronic) (peripheral): Secondary | ICD-10-CM | POA: Diagnosis not present

## 2022-12-26 DIAGNOSIS — M5136 Other intervertebral disc degeneration, lumbar region: Secondary | ICD-10-CM | POA: Diagnosis not present

## 2022-12-26 DIAGNOSIS — L03116 Cellulitis of left lower limb: Secondary | ICD-10-CM | POA: Diagnosis not present

## 2022-12-26 DIAGNOSIS — L409 Psoriasis, unspecified: Secondary | ICD-10-CM | POA: Diagnosis not present

## 2022-12-26 DIAGNOSIS — I5033 Acute on chronic diastolic (congestive) heart failure: Secondary | ICD-10-CM | POA: Diagnosis not present

## 2022-12-26 DIAGNOSIS — G43909 Migraine, unspecified, not intractable, without status migrainosus: Secondary | ICD-10-CM | POA: Diagnosis not present

## 2022-12-26 DIAGNOSIS — M300 Polyarteritis nodosa: Secondary | ICD-10-CM | POA: Diagnosis not present

## 2022-12-26 NOTE — Telephone Encounter (Signed)
I called and she states she really did not need anything . She states she just wanted to talk .

## 2022-12-26 NOTE — Telephone Encounter (Signed)
Caller name: LOYE HAFEY  On DPR?: Yes  Call back number: [phone] (mobile)  Provider they see: Sheliah Hatch, MD  Reason for call:  Sheryle Hail please give her a call

## 2022-12-27 ENCOUNTER — Ambulatory Visit: Admit: 2022-12-27 | Payer: MEDICARE

## 2022-12-27 DIAGNOSIS — G43909 Migraine, unspecified, not intractable, without status migrainosus: Secondary | ICD-10-CM | POA: Diagnosis not present

## 2022-12-27 DIAGNOSIS — J45909 Unspecified asthma, uncomplicated: Secondary | ICD-10-CM | POA: Diagnosis not present

## 2022-12-27 DIAGNOSIS — E785 Hyperlipidemia, unspecified: Secondary | ICD-10-CM | POA: Diagnosis not present

## 2022-12-27 DIAGNOSIS — K219 Gastro-esophageal reflux disease without esophagitis: Secondary | ICD-10-CM | POA: Diagnosis not present

## 2022-12-27 DIAGNOSIS — F32A Depression, unspecified: Secondary | ICD-10-CM | POA: Diagnosis not present

## 2022-12-27 DIAGNOSIS — G9529 Other cord compression: Secondary | ICD-10-CM | POA: Diagnosis not present

## 2022-12-27 DIAGNOSIS — I5033 Acute on chronic diastolic (congestive) heart failure: Secondary | ICD-10-CM | POA: Diagnosis not present

## 2022-12-27 DIAGNOSIS — L409 Psoriasis, unspecified: Secondary | ICD-10-CM | POA: Diagnosis not present

## 2022-12-27 DIAGNOSIS — G629 Polyneuropathy, unspecified: Secondary | ICD-10-CM | POA: Diagnosis not present

## 2022-12-27 DIAGNOSIS — M4802 Spinal stenosis, cervical region: Secondary | ICD-10-CM | POA: Diagnosis not present

## 2022-12-27 DIAGNOSIS — M797 Fibromyalgia: Secondary | ICD-10-CM | POA: Diagnosis not present

## 2022-12-27 DIAGNOSIS — G894 Chronic pain syndrome: Secondary | ICD-10-CM | POA: Diagnosis not present

## 2022-12-27 DIAGNOSIS — J9601 Acute respiratory failure with hypoxia: Secondary | ICD-10-CM | POA: Diagnosis not present

## 2022-12-27 DIAGNOSIS — L03116 Cellulitis of left lower limb: Secondary | ICD-10-CM | POA: Diagnosis not present

## 2022-12-27 DIAGNOSIS — N390 Urinary tract infection, site not specified: Secondary | ICD-10-CM | POA: Diagnosis not present

## 2022-12-27 DIAGNOSIS — E538 Deficiency of other specified B group vitamins: Secondary | ICD-10-CM | POA: Diagnosis not present

## 2022-12-27 DIAGNOSIS — E559 Vitamin D deficiency, unspecified: Secondary | ICD-10-CM | POA: Diagnosis not present

## 2022-12-27 DIAGNOSIS — I872 Venous insufficiency (chronic) (peripheral): Secondary | ICD-10-CM | POA: Diagnosis not present

## 2022-12-27 DIAGNOSIS — G4733 Obstructive sleep apnea (adult) (pediatric): Secondary | ICD-10-CM | POA: Diagnosis not present

## 2022-12-27 DIAGNOSIS — I110 Hypertensive heart disease with heart failure: Secondary | ICD-10-CM | POA: Diagnosis not present

## 2022-12-27 DIAGNOSIS — M5136 Other intervertebral disc degeneration, lumbar region: Secondary | ICD-10-CM | POA: Diagnosis not present

## 2022-12-27 DIAGNOSIS — K760 Fatty (change of) liver, not elsewhere classified: Secondary | ICD-10-CM | POA: Diagnosis not present

## 2022-12-27 DIAGNOSIS — M300 Polyarteritis nodosa: Secondary | ICD-10-CM | POA: Diagnosis not present

## 2022-12-28 DIAGNOSIS — J45909 Unspecified asthma, uncomplicated: Secondary | ICD-10-CM | POA: Diagnosis not present

## 2022-12-28 DIAGNOSIS — L409 Psoriasis, unspecified: Secondary | ICD-10-CM | POA: Diagnosis not present

## 2022-12-28 DIAGNOSIS — M797 Fibromyalgia: Secondary | ICD-10-CM | POA: Diagnosis not present

## 2022-12-28 DIAGNOSIS — I110 Hypertensive heart disease with heart failure: Secondary | ICD-10-CM | POA: Diagnosis not present

## 2022-12-28 DIAGNOSIS — J9601 Acute respiratory failure with hypoxia: Secondary | ICD-10-CM | POA: Diagnosis not present

## 2022-12-28 DIAGNOSIS — G629 Polyneuropathy, unspecified: Secondary | ICD-10-CM | POA: Diagnosis not present

## 2022-12-28 DIAGNOSIS — N390 Urinary tract infection, site not specified: Secondary | ICD-10-CM | POA: Diagnosis not present

## 2022-12-28 DIAGNOSIS — E785 Hyperlipidemia, unspecified: Secondary | ICD-10-CM | POA: Diagnosis not present

## 2022-12-28 DIAGNOSIS — K219 Gastro-esophageal reflux disease without esophagitis: Secondary | ICD-10-CM | POA: Diagnosis not present

## 2022-12-28 DIAGNOSIS — G4733 Obstructive sleep apnea (adult) (pediatric): Secondary | ICD-10-CM | POA: Diagnosis not present

## 2022-12-28 DIAGNOSIS — M5136 Other intervertebral disc degeneration, lumbar region: Secondary | ICD-10-CM | POA: Diagnosis not present

## 2022-12-28 DIAGNOSIS — F32A Depression, unspecified: Secondary | ICD-10-CM | POA: Diagnosis not present

## 2022-12-28 DIAGNOSIS — I872 Venous insufficiency (chronic) (peripheral): Secondary | ICD-10-CM | POA: Diagnosis not present

## 2022-12-28 DIAGNOSIS — E559 Vitamin D deficiency, unspecified: Secondary | ICD-10-CM | POA: Diagnosis not present

## 2022-12-28 DIAGNOSIS — I5033 Acute on chronic diastolic (congestive) heart failure: Secondary | ICD-10-CM | POA: Diagnosis not present

## 2022-12-28 DIAGNOSIS — G43909 Migraine, unspecified, not intractable, without status migrainosus: Secondary | ICD-10-CM | POA: Diagnosis not present

## 2022-12-28 DIAGNOSIS — M4802 Spinal stenosis, cervical region: Secondary | ICD-10-CM | POA: Diagnosis not present

## 2022-12-28 DIAGNOSIS — L03116 Cellulitis of left lower limb: Secondary | ICD-10-CM | POA: Diagnosis not present

## 2022-12-28 DIAGNOSIS — G9529 Other cord compression: Secondary | ICD-10-CM | POA: Diagnosis not present

## 2022-12-28 DIAGNOSIS — M300 Polyarteritis nodosa: Secondary | ICD-10-CM | POA: Diagnosis not present

## 2022-12-28 DIAGNOSIS — E538 Deficiency of other specified B group vitamins: Secondary | ICD-10-CM | POA: Diagnosis not present

## 2022-12-28 DIAGNOSIS — K760 Fatty (change of) liver, not elsewhere classified: Secondary | ICD-10-CM | POA: Diagnosis not present

## 2022-12-28 DIAGNOSIS — G894 Chronic pain syndrome: Secondary | ICD-10-CM | POA: Diagnosis not present

## 2022-12-30 DIAGNOSIS — G43909 Migraine, unspecified, not intractable, without status migrainosus: Secondary | ICD-10-CM | POA: Diagnosis not present

## 2022-12-30 DIAGNOSIS — G9529 Other cord compression: Secondary | ICD-10-CM | POA: Diagnosis not present

## 2022-12-30 DIAGNOSIS — M4802 Spinal stenosis, cervical region: Secondary | ICD-10-CM | POA: Diagnosis not present

## 2022-12-30 DIAGNOSIS — L409 Psoriasis, unspecified: Secondary | ICD-10-CM | POA: Diagnosis not present

## 2022-12-30 DIAGNOSIS — J45909 Unspecified asthma, uncomplicated: Secondary | ICD-10-CM | POA: Diagnosis not present

## 2022-12-30 DIAGNOSIS — G629 Polyneuropathy, unspecified: Secondary | ICD-10-CM | POA: Diagnosis not present

## 2022-12-30 DIAGNOSIS — E559 Vitamin D deficiency, unspecified: Secondary | ICD-10-CM | POA: Diagnosis not present

## 2022-12-30 DIAGNOSIS — G894 Chronic pain syndrome: Secondary | ICD-10-CM | POA: Diagnosis not present

## 2022-12-30 DIAGNOSIS — M300 Polyarteritis nodosa: Secondary | ICD-10-CM | POA: Diagnosis not present

## 2022-12-30 DIAGNOSIS — J9601 Acute respiratory failure with hypoxia: Secondary | ICD-10-CM | POA: Diagnosis not present

## 2022-12-30 DIAGNOSIS — E785 Hyperlipidemia, unspecified: Secondary | ICD-10-CM | POA: Diagnosis not present

## 2022-12-30 DIAGNOSIS — L03116 Cellulitis of left lower limb: Secondary | ICD-10-CM | POA: Diagnosis not present

## 2022-12-30 DIAGNOSIS — I872 Venous insufficiency (chronic) (peripheral): Secondary | ICD-10-CM | POA: Diagnosis not present

## 2022-12-30 DIAGNOSIS — K760 Fatty (change of) liver, not elsewhere classified: Secondary | ICD-10-CM | POA: Diagnosis not present

## 2022-12-30 DIAGNOSIS — E538 Deficiency of other specified B group vitamins: Secondary | ICD-10-CM | POA: Diagnosis not present

## 2022-12-30 DIAGNOSIS — K219 Gastro-esophageal reflux disease without esophagitis: Secondary | ICD-10-CM | POA: Diagnosis not present

## 2022-12-30 DIAGNOSIS — I110 Hypertensive heart disease with heart failure: Secondary | ICD-10-CM | POA: Diagnosis not present

## 2022-12-30 DIAGNOSIS — M5136 Other intervertebral disc degeneration, lumbar region: Secondary | ICD-10-CM | POA: Diagnosis not present

## 2022-12-30 DIAGNOSIS — I5033 Acute on chronic diastolic (congestive) heart failure: Secondary | ICD-10-CM | POA: Diagnosis not present

## 2022-12-30 DIAGNOSIS — G4733 Obstructive sleep apnea (adult) (pediatric): Secondary | ICD-10-CM | POA: Diagnosis not present

## 2022-12-30 DIAGNOSIS — M797 Fibromyalgia: Secondary | ICD-10-CM | POA: Diagnosis not present

## 2022-12-30 DIAGNOSIS — N390 Urinary tract infection, site not specified: Secondary | ICD-10-CM | POA: Diagnosis not present

## 2022-12-30 DIAGNOSIS — F32A Depression, unspecified: Secondary | ICD-10-CM | POA: Diagnosis not present

## 2023-01-01 DIAGNOSIS — E785 Hyperlipidemia, unspecified: Secondary | ICD-10-CM | POA: Diagnosis not present

## 2023-01-01 DIAGNOSIS — M5136 Other intervertebral disc degeneration, lumbar region: Secondary | ICD-10-CM | POA: Diagnosis not present

## 2023-01-01 DIAGNOSIS — J9601 Acute respiratory failure with hypoxia: Secondary | ICD-10-CM | POA: Diagnosis not present

## 2023-01-01 DIAGNOSIS — M4802 Spinal stenosis, cervical region: Secondary | ICD-10-CM | POA: Diagnosis not present

## 2023-01-01 DIAGNOSIS — J45909 Unspecified asthma, uncomplicated: Secondary | ICD-10-CM | POA: Diagnosis not present

## 2023-01-01 DIAGNOSIS — G9529 Other cord compression: Secondary | ICD-10-CM | POA: Diagnosis not present

## 2023-01-01 DIAGNOSIS — L03116 Cellulitis of left lower limb: Secondary | ICD-10-CM | POA: Diagnosis not present

## 2023-01-01 DIAGNOSIS — I5033 Acute on chronic diastolic (congestive) heart failure: Secondary | ICD-10-CM | POA: Diagnosis not present

## 2023-01-01 DIAGNOSIS — M300 Polyarteritis nodosa: Secondary | ICD-10-CM | POA: Diagnosis not present

## 2023-01-01 DIAGNOSIS — E538 Deficiency of other specified B group vitamins: Secondary | ICD-10-CM | POA: Diagnosis not present

## 2023-01-01 DIAGNOSIS — K219 Gastro-esophageal reflux disease without esophagitis: Secondary | ICD-10-CM | POA: Diagnosis not present

## 2023-01-01 DIAGNOSIS — G4733 Obstructive sleep apnea (adult) (pediatric): Secondary | ICD-10-CM | POA: Diagnosis not present

## 2023-01-01 DIAGNOSIS — L409 Psoriasis, unspecified: Secondary | ICD-10-CM | POA: Diagnosis not present

## 2023-01-01 DIAGNOSIS — G43909 Migraine, unspecified, not intractable, without status migrainosus: Secondary | ICD-10-CM | POA: Diagnosis not present

## 2023-01-01 DIAGNOSIS — I872 Venous insufficiency (chronic) (peripheral): Secondary | ICD-10-CM | POA: Diagnosis not present

## 2023-01-01 DIAGNOSIS — N390 Urinary tract infection, site not specified: Secondary | ICD-10-CM | POA: Diagnosis not present

## 2023-01-01 DIAGNOSIS — G894 Chronic pain syndrome: Secondary | ICD-10-CM | POA: Diagnosis not present

## 2023-01-01 DIAGNOSIS — M797 Fibromyalgia: Secondary | ICD-10-CM | POA: Diagnosis not present

## 2023-01-01 DIAGNOSIS — K760 Fatty (change of) liver, not elsewhere classified: Secondary | ICD-10-CM | POA: Diagnosis not present

## 2023-01-01 DIAGNOSIS — I110 Hypertensive heart disease with heart failure: Secondary | ICD-10-CM | POA: Diagnosis not present

## 2023-01-01 DIAGNOSIS — E559 Vitamin D deficiency, unspecified: Secondary | ICD-10-CM | POA: Diagnosis not present

## 2023-01-01 DIAGNOSIS — G629 Polyneuropathy, unspecified: Secondary | ICD-10-CM | POA: Diagnosis not present

## 2023-01-01 DIAGNOSIS — F32A Depression, unspecified: Secondary | ICD-10-CM | POA: Diagnosis not present

## 2023-01-02 DIAGNOSIS — J9601 Acute respiratory failure with hypoxia: Secondary | ICD-10-CM | POA: Diagnosis not present

## 2023-01-02 DIAGNOSIS — M4802 Spinal stenosis, cervical region: Secondary | ICD-10-CM | POA: Diagnosis not present

## 2023-01-02 DIAGNOSIS — M5136 Other intervertebral disc degeneration, lumbar region: Secondary | ICD-10-CM | POA: Diagnosis not present

## 2023-01-02 DIAGNOSIS — G894 Chronic pain syndrome: Secondary | ICD-10-CM | POA: Diagnosis not present

## 2023-01-02 DIAGNOSIS — G9529 Other cord compression: Secondary | ICD-10-CM | POA: Diagnosis not present

## 2023-01-02 DIAGNOSIS — G4733 Obstructive sleep apnea (adult) (pediatric): Secondary | ICD-10-CM | POA: Diagnosis not present

## 2023-01-02 DIAGNOSIS — E538 Deficiency of other specified B group vitamins: Secondary | ICD-10-CM | POA: Diagnosis not present

## 2023-01-02 DIAGNOSIS — J45909 Unspecified asthma, uncomplicated: Secondary | ICD-10-CM | POA: Diagnosis not present

## 2023-01-02 DIAGNOSIS — I110 Hypertensive heart disease with heart failure: Secondary | ICD-10-CM | POA: Diagnosis not present

## 2023-01-02 DIAGNOSIS — N390 Urinary tract infection, site not specified: Secondary | ICD-10-CM | POA: Diagnosis not present

## 2023-01-02 DIAGNOSIS — K219 Gastro-esophageal reflux disease without esophagitis: Secondary | ICD-10-CM | POA: Diagnosis not present

## 2023-01-02 DIAGNOSIS — I5033 Acute on chronic diastolic (congestive) heart failure: Secondary | ICD-10-CM | POA: Diagnosis not present

## 2023-01-02 DIAGNOSIS — M797 Fibromyalgia: Secondary | ICD-10-CM | POA: Diagnosis not present

## 2023-01-02 DIAGNOSIS — G43909 Migraine, unspecified, not intractable, without status migrainosus: Secondary | ICD-10-CM | POA: Diagnosis not present

## 2023-01-02 DIAGNOSIS — L03116 Cellulitis of left lower limb: Secondary | ICD-10-CM | POA: Diagnosis not present

## 2023-01-02 DIAGNOSIS — G629 Polyneuropathy, unspecified: Secondary | ICD-10-CM | POA: Diagnosis not present

## 2023-01-02 DIAGNOSIS — K760 Fatty (change of) liver, not elsewhere classified: Secondary | ICD-10-CM | POA: Diagnosis not present

## 2023-01-02 DIAGNOSIS — E785 Hyperlipidemia, unspecified: Secondary | ICD-10-CM | POA: Diagnosis not present

## 2023-01-02 DIAGNOSIS — E559 Vitamin D deficiency, unspecified: Secondary | ICD-10-CM | POA: Diagnosis not present

## 2023-01-02 DIAGNOSIS — L409 Psoriasis, unspecified: Secondary | ICD-10-CM | POA: Diagnosis not present

## 2023-01-02 DIAGNOSIS — M300 Polyarteritis nodosa: Secondary | ICD-10-CM | POA: Diagnosis not present

## 2023-01-02 DIAGNOSIS — F32A Depression, unspecified: Secondary | ICD-10-CM | POA: Diagnosis not present

## 2023-01-02 DIAGNOSIS — I872 Venous insufficiency (chronic) (peripheral): Secondary | ICD-10-CM | POA: Diagnosis not present

## 2023-01-03 ENCOUNTER — Other Ambulatory Visit: Payer: Self-pay | Admitting: Family Medicine

## 2023-01-03 ENCOUNTER — Telehealth: Payer: Self-pay

## 2023-01-03 NOTE — Telephone Encounter (Signed)
[facility] sent form to be signed . Placed in Dr Beverely Low to be signed folder

## 2023-01-03 NOTE — Telephone Encounter (Signed)
Faxed forms and placed in scan  

## 2023-01-03 NOTE — Telephone Encounter (Signed)
Form completed and returned to [place] 

## 2023-01-04 ENCOUNTER — Other Ambulatory Visit: Payer: Self-pay | Admitting: Family Medicine

## 2023-01-04 DIAGNOSIS — G9589 Other specified diseases of spinal cord: Secondary | ICD-10-CM

## 2023-01-04 NOTE — Telephone Encounter (Signed)
Hydrocodone 10-325 mg Requested Prescriptions   Pending Prescriptions Disp Refills   HYDROcodone-acetaminophen (NORCO) 10-325 MG tablet [Pharmacy Med Name: hydrocodone 10 mg-acetaminophen 325 mg tablet] 90 tablet 0    Sig: TAKE 1 TABLET BY MOUTH EVERY 8 HOURS AS NEEDED     Date of patient request: 01/04/23 Last office visit: 12/14/22 Date of last refill: 12/05/22 Last refill amount: 90 Follow up time period per chart: AS needed

## 2023-01-07 DIAGNOSIS — G9529 Other cord compression: Secondary | ICD-10-CM | POA: Diagnosis not present

## 2023-01-07 DIAGNOSIS — L03116 Cellulitis of left lower limb: Secondary | ICD-10-CM | POA: Diagnosis not present

## 2023-01-07 DIAGNOSIS — L409 Psoriasis, unspecified: Secondary | ICD-10-CM | POA: Diagnosis not present

## 2023-01-07 DIAGNOSIS — G629 Polyneuropathy, unspecified: Secondary | ICD-10-CM | POA: Diagnosis not present

## 2023-01-07 DIAGNOSIS — E559 Vitamin D deficiency, unspecified: Secondary | ICD-10-CM | POA: Diagnosis not present

## 2023-01-07 DIAGNOSIS — K219 Gastro-esophageal reflux disease without esophagitis: Secondary | ICD-10-CM | POA: Diagnosis not present

## 2023-01-07 DIAGNOSIS — G4733 Obstructive sleep apnea (adult) (pediatric): Secondary | ICD-10-CM | POA: Diagnosis not present

## 2023-01-07 DIAGNOSIS — M4802 Spinal stenosis, cervical region: Secondary | ICD-10-CM | POA: Diagnosis not present

## 2023-01-07 DIAGNOSIS — E785 Hyperlipidemia, unspecified: Secondary | ICD-10-CM | POA: Diagnosis not present

## 2023-01-07 DIAGNOSIS — I5033 Acute on chronic diastolic (congestive) heart failure: Secondary | ICD-10-CM | POA: Diagnosis not present

## 2023-01-07 DIAGNOSIS — J9601 Acute respiratory failure with hypoxia: Secondary | ICD-10-CM | POA: Diagnosis not present

## 2023-01-07 DIAGNOSIS — G43909 Migraine, unspecified, not intractable, without status migrainosus: Secondary | ICD-10-CM | POA: Diagnosis not present

## 2023-01-07 DIAGNOSIS — I872 Venous insufficiency (chronic) (peripheral): Secondary | ICD-10-CM | POA: Diagnosis not present

## 2023-01-07 DIAGNOSIS — M797 Fibromyalgia: Secondary | ICD-10-CM | POA: Diagnosis not present

## 2023-01-07 DIAGNOSIS — I110 Hypertensive heart disease with heart failure: Secondary | ICD-10-CM | POA: Diagnosis not present

## 2023-01-07 DIAGNOSIS — E538 Deficiency of other specified B group vitamins: Secondary | ICD-10-CM | POA: Diagnosis not present

## 2023-01-07 DIAGNOSIS — M5136 Other intervertebral disc degeneration, lumbar region: Secondary | ICD-10-CM | POA: Diagnosis not present

## 2023-01-07 DIAGNOSIS — K760 Fatty (change of) liver, not elsewhere classified: Secondary | ICD-10-CM | POA: Diagnosis not present

## 2023-01-07 DIAGNOSIS — G894 Chronic pain syndrome: Secondary | ICD-10-CM | POA: Diagnosis not present

## 2023-01-07 DIAGNOSIS — M300 Polyarteritis nodosa: Secondary | ICD-10-CM | POA: Diagnosis not present

## 2023-01-07 DIAGNOSIS — F32A Depression, unspecified: Secondary | ICD-10-CM | POA: Diagnosis not present

## 2023-01-07 DIAGNOSIS — N390 Urinary tract infection, site not specified: Secondary | ICD-10-CM | POA: Diagnosis not present

## 2023-01-07 DIAGNOSIS — J45909 Unspecified asthma, uncomplicated: Secondary | ICD-10-CM | POA: Diagnosis not present

## 2023-01-09 ENCOUNTER — Telehealth: Payer: 59 | Admitting: Family Medicine

## 2023-01-09 ENCOUNTER — Encounter: Payer: Self-pay | Admitting: Family Medicine

## 2023-01-09 ENCOUNTER — Telehealth: Payer: Self-pay | Admitting: Family Medicine

## 2023-01-09 DIAGNOSIS — M5136 Other intervertebral disc degeneration, lumbar region: Secondary | ICD-10-CM | POA: Diagnosis not present

## 2023-01-09 DIAGNOSIS — I5033 Acute on chronic diastolic (congestive) heart failure: Secondary | ICD-10-CM | POA: Diagnosis not present

## 2023-01-09 DIAGNOSIS — E559 Vitamin D deficiency, unspecified: Secondary | ICD-10-CM | POA: Diagnosis not present

## 2023-01-09 DIAGNOSIS — J45909 Unspecified asthma, uncomplicated: Secondary | ICD-10-CM | POA: Diagnosis not present

## 2023-01-09 DIAGNOSIS — M797 Fibromyalgia: Secondary | ICD-10-CM | POA: Diagnosis not present

## 2023-01-09 DIAGNOSIS — N390 Urinary tract infection, site not specified: Secondary | ICD-10-CM | POA: Diagnosis not present

## 2023-01-09 DIAGNOSIS — J9601 Acute respiratory failure with hypoxia: Secondary | ICD-10-CM | POA: Diagnosis not present

## 2023-01-09 DIAGNOSIS — L409 Psoriasis, unspecified: Secondary | ICD-10-CM | POA: Diagnosis not present

## 2023-01-09 DIAGNOSIS — G43909 Migraine, unspecified, not intractable, without status migrainosus: Secondary | ICD-10-CM | POA: Diagnosis not present

## 2023-01-09 DIAGNOSIS — I110 Hypertensive heart disease with heart failure: Secondary | ICD-10-CM | POA: Diagnosis not present

## 2023-01-09 DIAGNOSIS — E785 Hyperlipidemia, unspecified: Secondary | ICD-10-CM | POA: Diagnosis not present

## 2023-01-09 DIAGNOSIS — G4733 Obstructive sleep apnea (adult) (pediatric): Secondary | ICD-10-CM | POA: Diagnosis not present

## 2023-01-09 DIAGNOSIS — M300 Polyarteritis nodosa: Secondary | ICD-10-CM | POA: Diagnosis not present

## 2023-01-09 DIAGNOSIS — M4802 Spinal stenosis, cervical region: Secondary | ICD-10-CM | POA: Diagnosis not present

## 2023-01-09 DIAGNOSIS — K219 Gastro-esophageal reflux disease without esophagitis: Secondary | ICD-10-CM | POA: Diagnosis not present

## 2023-01-09 DIAGNOSIS — G9529 Other cord compression: Secondary | ICD-10-CM | POA: Diagnosis not present

## 2023-01-09 DIAGNOSIS — F4321 Adjustment disorder with depressed mood: Secondary | ICD-10-CM

## 2023-01-09 DIAGNOSIS — G894 Chronic pain syndrome: Secondary | ICD-10-CM | POA: Diagnosis not present

## 2023-01-09 DIAGNOSIS — K760 Fatty (change of) liver, not elsewhere classified: Secondary | ICD-10-CM | POA: Diagnosis not present

## 2023-01-09 DIAGNOSIS — F32A Depression, unspecified: Secondary | ICD-10-CM | POA: Diagnosis not present

## 2023-01-09 DIAGNOSIS — I872 Venous insufficiency (chronic) (peripheral): Secondary | ICD-10-CM | POA: Diagnosis not present

## 2023-01-09 DIAGNOSIS — G629 Polyneuropathy, unspecified: Secondary | ICD-10-CM | POA: Diagnosis not present

## 2023-01-09 DIAGNOSIS — L03116 Cellulitis of left lower limb: Secondary | ICD-10-CM | POA: Diagnosis not present

## 2023-01-09 DIAGNOSIS — E538 Deficiency of other specified B group vitamins: Secondary | ICD-10-CM | POA: Diagnosis not present

## 2023-01-09 NOTE — Telephone Encounter (Signed)
Caller name: ETRULIA CO  On DPR?: Yes  Call back number: [phone] (mobile)  Provider they see: Sheliah Hatch, MD  Reason for call:  Pt has a question about her chair and would like a return call. She forgot to ask Dr. Beverely Low during her visit.

## 2023-01-09 NOTE — Telephone Encounter (Signed)
Pt wants to know if CHF hereditary Pt states she has to equably for her CPAP   Wants to know should she be on medicine for the CHF ?

## 2023-01-09 NOTE — Patient Instructions (Addendum)
Please call Hospice to schedule grief counseling-[phone] You can also ask about Grief Share- a virtual support group

## 2023-01-09 NOTE — Telephone Encounter (Signed)
I have spoke to the pt and advised she does not have CHF and she will need to reach out to pulmonologist to set up an apt so they can re qualify for the CPAP machine

## 2023-01-09 NOTE — Telephone Encounter (Signed)
Based on the ECHO she had done last month, she does not have CHF.  No need for medication  If she needs to re-qualify for CPAP, usually that means repeating a sleep study or seeing her pulmonologist

## 2023-01-09 NOTE — Progress Notes (Signed)
Virtual Visit via Video   I connected with patient on 01/09/23 at 10:20 AM EDT by a video enabled telemedicine application and verified that I am speaking with the correct person using two identifiers.  Location patient: Home Location provider: [place] [facility], [facility] Persons participating in the virtual visit: Patient, Provider, CMA Sheryle Hail C)  I discussed the limitations of evaluation and management by telemedicine and the availability of in person appointments. The patient expressed understanding and agreed to proceed.  Subjective:   HPI:   Depression- currently on Cymbalta 120mg  daily, Wellbutrin XL 300mg  daily, Abilify 15mg  daily and using Alprazolam and Clonazepam regularly.  'i'm just down'.  Father recently passed away.  Not currently doing Grief Counseling.  ROS:   See pertinent positives and negatives per HPI.  Patient Active Problem List   Diagnosis Date Noted   Bedbound 12/14/2022   Acute on chronic diastolic CHF (congestive heart failure) (HCC) 11/23/2022   Pressure injury of skin 11/23/2022   Nocturnal hypoxemia 06/21/2022   Elevated LFTs 05/06/2022   Obesity hypoventilation syndrome (HCC) 05/06/2022   Acute respiratory failure with hypoxia (HCC) 04/22/2022   Hypokalemia 04/22/2022   Leg cramps 01/12/2022   Fever of unknown origin 08/12/2021   Sacral pressure sore 07/06/2021   Vitamin B6 deficiency 06/08/2021   Myelomalacia of cervical cord (HCC) 06/06/2021   Compressive cervical cord myelomalacia (HCC) 06/05/2021   B12 deficiency 06/05/2021   Folate deficiency 06/05/2021   Vitamin D deficiency 06/05/2021   Leg weakness, bilateral 06/04/2021   Cervical atypia 01/06/2021   Steatosis of liver 01/06/2021   Hyponatremia 07/27/2019   Urinary urgency 04/04/2018   Chronic narcotic use 09/07/2016   Acute blood loss anemia 01/14/2016   Multiple gastric ulcers    PAN (polyarteritis nodosa) (HCC) 11/24/2015   GERD (gastroesophageal reflux disease)  01/08/2014   Routine general medical examination at a health care facility 04/23/2013   Bariatric surgery status 10/31/2012   S/P skin biopsy 10/31/2012   Psoriasis 01/09/2012   DDD (degenerative disc disease), lumbar 11/30/2011   Migraine headache    [name] (obstructive sleep apnea) 11/16/2010   HYPERGLYCEMIA, FASTING 10/08/2009   CONTRACTURE OF TENDON 08/17/2009   PARESTHESIA, HANDS 12/23/2008   Leg pain, right 11/05/2008   ADVERSE DRUG REACTION 04/03/2008   PERIPHERAL EDEMA 03/26/2008   Obesity, morbid, BMI 50 or higher (HCC) 01/03/2007   Cellulitis 01/03/2007   Hyperlipidemia 09/22/2006   Fibromyalgia 09/20/2006   Anxiety and depression 06/28/2006   Essential hypertension 06/28/2006    Social History   Tobacco Use   Smoking status: Never   Smokeless tobacco: Never  Substance Use Topics   Alcohol use: No    Current Outpatient Medications:    albuterol (VENTOLIN HFA) 108 (90 Base) MCG/ACT inhaler, Inhale 2 puffs into the lungs every 6 (six) hours as needed for wheezing or shortness of breath., Disp: 8 g, Rfl: 0   ALPRAZolam (XANAX) 0.5 MG tablet, Take 1 tablet (0.5 mg total) by mouth 2 (two) times daily., Disp: 60 tablet, Rfl: 3   ARIPiprazole (ABILIFY) 15 MG tablet, TAKE 1 TABLET BY MOUTH EVERY DAY, Disp: 90 tablet, Rfl: 1   Armodafinil 250 MG tablet, Take 1 tablet (250 mg total) by mouth in the morning and at bedtime., Disp: 60 tablet, Rfl: 3   Biotin 10 MG TABS, Take 1 tablet (10 mg total) by mouth in the morning., Disp: 90 tablet, Rfl: 1   buPROPion (WELLBUTRIN XL) 300 MG 24 hr tablet, TAKE 1 TABLET BY MOUTH  EVERY DAY, Disp: 30 tablet, Rfl: 2   calcium citrate (CALCITRATE - DOSED IN MG ELEMENTAL CALCIUM) 950 (200 Ca) MG tablet, Take 1 tablet (200 mg of elemental calcium total) by mouth daily., Disp: 30 tablet, Rfl: 3   clonazePAM (KLONOPIN) 1 MG tablet, TAKE 1 TABLET BY MOUTH AT BEDTIME, Disp: 30 tablet, Rfl: 3   cyanocobalamin (VITAMIN B12) 1000 MCG tablet, TAKE 1 TABLET BY  MOUTH EVERY DAY, Disp: 30 tablet, Rfl: 2   cyclobenzaprine (FLEXERIL) 10 MG tablet, TAKE 2 TABLETS BY MOUTH 2 TIMES DAILY, Disp: 120 tablet, Rfl: 2   diclofenac Sodium (VOLTAREN) 1 % GEL, Apply 4 g topically 4 (four) times daily., Disp: 1 g, Rfl: 1   doxycycline (VIBRA-TABS) 100 MG tablet, Take 1 tablet (100 mg total) by mouth 2 (two) times daily., Disp: 20 tablet, Rfl: 0   DULoxetine (CYMBALTA) 60 MG capsule, Take 2 capsules (120 mg total) by mouth daily. (Patient taking differently: Take 60 mg by mouth 2 (two) times daily. Pt reported taking two capsules(120mg ) once daily), Disp: 240 capsule, Rfl: 2   FEROSUL 325 (65 Fe) MG tablet, TAKE 1 TABLET BY MOUTH EVERY MORNING WITH BREAKFAST, Disp: 30 tablet, Rfl: 2   fluticasone-salmeterol (ADVAIR DISKUS) 100-50 MCG/ACT AEPB, Inhale 1 puff into the lungs 2 (two) times daily., Disp: 60 each, Rfl: 5   folic acid (FOLVITE) 1 MG tablet, TAKE 1 TABLET BY MOUTH EVERY DAY, Disp: 30 tablet, Rfl: 2   furosemide (LASIX) 40 MG tablet, Take 1 tablet (40 mg total) by mouth daily., Disp: 30 tablet, Rfl: 1   gabapentin (NEURONTIN) 600 MG tablet, Take 1 tablet (600 mg total) by mouth 2 (two) times daily as needed. Pt takes daily - can take up to twice daily, Disp: 60 tablet, Rfl: 1   GNP VITAMIN B-1 100 MG tablet, TAKE 1 TABLET BY MOUTH EVERY DAY, Disp: 30 tablet, Rfl: 2   HYDROcodone-acetaminophen (NORCO) 10-325 MG tablet, TAKE 1 TABLET BY MOUTH EVERY 8 HOURS AS NEEDED, Disp: 90 tablet, Rfl: 0   nebivolol (BYSTOLIC) 10 MG tablet, TAKE 1 TABLET BY MOUTH EVERY DAY, Disp: 30 tablet, Rfl: 2   nystatin (MYCOSTATIN/NYSTOP) powder, APPLY 1 APPLICATION TOPICALLY 3 TIMES DAILY, Disp: 60 g, Rfl: 3   Oxcarbazepine (TRILEPTAL) 300 MG tablet, Take 300 mg by mouth 2 (two) times daily., Disp: , Rfl:    pantoprazole (PROTONIX) 40 MG tablet, TAKE 1 TABLET BY MOUTH 2 TIMES DAILY, Disp: 60 tablet, Rfl: 2   phentermine 37.5 MG capsule, Take 1 capsule (37.5 mg total) by mouth every morning.,  Disp: 90 capsule, Rfl: 0   potassium chloride (KLOR-CON) 20 MEQ packet, Take 1 packet by mouth daily. Take with lasix., Disp: 30 packet, Rfl: 1   promethazine (PHENERGAN) 25 MG tablet, TAKE 1 TABLET BY MOUTH EVERY 6 HOURS AS NEEDED FOR NAUSEA AND VOMITING, Disp: 45 tablet, Rfl: 3   pyridOXINE (VITAMIN B6) 100 MG tablet, TAKE 1 TABLET BY MOUTH EVERY DAY, Disp: 30 tablet, Rfl: 2  Allergies  Allergen Reactions   Niacin Anaphylaxis, Shortness Of Breath and Swelling    Swelling and "problems breathing"   Sulfamethoxazole-Trimethoprim Anaphylaxis, Swelling, Rash and Other (See Comments)    Throat closed and the eyes became swollen   Aspirin Hives   Bactrim Hives   Benzoin Compound Rash   Cephalexin Hives    Tolerated ceftriaxone and cefepime 07/2019   Iohexol Hives    Pt treated with PO benedryl   Lisinopril Hives   Naproxen  Hives   Pnu-Imune [Pneumococcal Polysaccharide Vaccine] Rash   Sulfonamide Derivatives Rash    Objective:   There were no vitals taken for this visit. AAOx3, NAD NCAT, EOMI No obvious CN deficits Coloring WNL Pt is able to speak clearly, coherently without shortness of breath or increased work of breathing.  Thought process is linear.  Mood is appropriate.   Assessment and Plan:   Grief- new.  Pt reports she has been 'really down' since her father passed.  She has been on anxiety and depression meds for years and has done well.  Discussed that grief is a normal process, and not pathologic.  Encouraged her to start grief counseling- information provided.  Will hold on med adjustments at this time.  Pt expressed understanding and is in agreement w/ plan.    Neena Rhymes, MD 01/09/2023

## 2023-01-10 ENCOUNTER — Other Ambulatory Visit: Payer: Self-pay | Admitting: Family Medicine

## 2023-01-10 ENCOUNTER — Telehealth: Payer: Self-pay | Admitting: Family Medicine

## 2023-01-10 ENCOUNTER — Telehealth: Payer: Self-pay

## 2023-01-10 DIAGNOSIS — K760 Fatty (change of) liver, not elsewhere classified: Secondary | ICD-10-CM | POA: Diagnosis not present

## 2023-01-10 DIAGNOSIS — I872 Venous insufficiency (chronic) (peripheral): Secondary | ICD-10-CM | POA: Diagnosis not present

## 2023-01-10 DIAGNOSIS — L03116 Cellulitis of left lower limb: Secondary | ICD-10-CM | POA: Diagnosis not present

## 2023-01-10 DIAGNOSIS — E785 Hyperlipidemia, unspecified: Secondary | ICD-10-CM | POA: Diagnosis not present

## 2023-01-10 DIAGNOSIS — G4733 Obstructive sleep apnea (adult) (pediatric): Secondary | ICD-10-CM | POA: Diagnosis not present

## 2023-01-10 DIAGNOSIS — F32A Depression, unspecified: Secondary | ICD-10-CM | POA: Diagnosis not present

## 2023-01-10 DIAGNOSIS — E559 Vitamin D deficiency, unspecified: Secondary | ICD-10-CM | POA: Diagnosis not present

## 2023-01-10 DIAGNOSIS — I5033 Acute on chronic diastolic (congestive) heart failure: Secondary | ICD-10-CM | POA: Diagnosis not present

## 2023-01-10 DIAGNOSIS — E538 Deficiency of other specified B group vitamins: Secondary | ICD-10-CM | POA: Diagnosis not present

## 2023-01-10 DIAGNOSIS — M4802 Spinal stenosis, cervical region: Secondary | ICD-10-CM | POA: Diagnosis not present

## 2023-01-10 DIAGNOSIS — I110 Hypertensive heart disease with heart failure: Secondary | ICD-10-CM | POA: Diagnosis not present

## 2023-01-10 DIAGNOSIS — L409 Psoriasis, unspecified: Secondary | ICD-10-CM | POA: Diagnosis not present

## 2023-01-10 DIAGNOSIS — G9529 Other cord compression: Secondary | ICD-10-CM | POA: Diagnosis not present

## 2023-01-10 DIAGNOSIS — G43909 Migraine, unspecified, not intractable, without status migrainosus: Secondary | ICD-10-CM | POA: Diagnosis not present

## 2023-01-10 DIAGNOSIS — M300 Polyarteritis nodosa: Secondary | ICD-10-CM | POA: Diagnosis not present

## 2023-01-10 DIAGNOSIS — N390 Urinary tract infection, site not specified: Secondary | ICD-10-CM | POA: Diagnosis not present

## 2023-01-10 DIAGNOSIS — J9601 Acute respiratory failure with hypoxia: Secondary | ICD-10-CM | POA: Diagnosis not present

## 2023-01-10 DIAGNOSIS — J45909 Unspecified asthma, uncomplicated: Secondary | ICD-10-CM | POA: Diagnosis not present

## 2023-01-10 DIAGNOSIS — G629 Polyneuropathy, unspecified: Secondary | ICD-10-CM | POA: Diagnosis not present

## 2023-01-10 DIAGNOSIS — M797 Fibromyalgia: Secondary | ICD-10-CM | POA: Diagnosis not present

## 2023-01-10 DIAGNOSIS — M5136 Other intervertebral disc degeneration, lumbar region: Secondary | ICD-10-CM | POA: Diagnosis not present

## 2023-01-10 DIAGNOSIS — G894 Chronic pain syndrome: Secondary | ICD-10-CM | POA: Diagnosis not present

## 2023-01-10 DIAGNOSIS — K219 Gastro-esophageal reflux disease without esophagitis: Secondary | ICD-10-CM | POA: Diagnosis not present

## 2023-01-10 NOTE — Telephone Encounter (Signed)
[name] from [facility] called and states pt o2 was 78 had pt take some deep breaths and it came up to 88 . They are wanting to do a portable chest xray and lab work ? I advised [name] and Taylre per Dr Beverely Low if her o2 drops to 85  she has to go to the ER .

## 2023-01-10 NOTE — Telephone Encounter (Signed)
Home Health  Agency: [facility]   Caller: [name]  Call back #: [phone]   Asking for a call from [facility]

## 2023-01-10 NOTE — Telephone Encounter (Signed)
Agree w/ advice given.  Will get CXR and labs, but if O2 drops to 85 or lower, needs ER evaluation

## 2023-01-10 NOTE — Telephone Encounter (Signed)
Another phone note in placed and addressed

## 2023-01-10 NOTE — Telephone Encounter (Signed)
Pharmacy sent a refill for Lasix for pt . Appears [facility] on [address] sent this in for her last . Would you like me to froward to their office ?

## 2023-01-11 DIAGNOSIS — R0902 Hypoxemia: Secondary | ICD-10-CM | POA: Diagnosis not present

## 2023-01-12 ENCOUNTER — Telehealth: Payer: Self-pay | Admitting: Family Medicine

## 2023-01-12 ENCOUNTER — Other Ambulatory Visit: Payer: Self-pay

## 2023-01-12 ENCOUNTER — Inpatient Hospital Stay (HOSPITAL_COMMUNITY)
Admission: EM | Admit: 2023-01-12 | Discharge: 2023-01-14 | DRG: 291 | Disposition: A | Discharge status: Home Health | Payer: 59 | Attending: Internal Medicine | Admitting: Internal Medicine

## 2023-01-12 ENCOUNTER — Encounter (HOSPITAL_COMMUNITY): Payer: Self-pay

## 2023-01-12 ENCOUNTER — Emergency Department (HOSPITAL_COMMUNITY): Payer: 59

## 2023-01-12 DIAGNOSIS — G43909 Migraine, unspecified, not intractable, without status migrainosus: Secondary | ICD-10-CM | POA: Diagnosis not present

## 2023-01-12 DIAGNOSIS — Z9884 Bariatric surgery status: Secondary | ICD-10-CM | POA: Diagnosis not present

## 2023-01-12 DIAGNOSIS — I872 Venous insufficiency (chronic) (peripheral): Secondary | ICD-10-CM | POA: Diagnosis not present

## 2023-01-12 DIAGNOSIS — E876 Hypokalemia: Secondary | ICD-10-CM | POA: Diagnosis not present

## 2023-01-12 DIAGNOSIS — I509 Heart failure, unspecified: Secondary | ICD-10-CM

## 2023-01-12 DIAGNOSIS — Z66 Do not resuscitate: Secondary | ICD-10-CM | POA: Diagnosis present

## 2023-01-12 DIAGNOSIS — M797 Fibromyalgia: Secondary | ICD-10-CM | POA: Diagnosis present

## 2023-01-12 DIAGNOSIS — L409 Psoriasis, unspecified: Secondary | ICD-10-CM | POA: Diagnosis not present

## 2023-01-12 DIAGNOSIS — F419 Anxiety disorder, unspecified: Secondary | ICD-10-CM | POA: Diagnosis present

## 2023-01-12 DIAGNOSIS — M300 Polyarteritis nodosa: Secondary | ICD-10-CM | POA: Diagnosis not present

## 2023-01-12 DIAGNOSIS — Z882 Allergy status to sulfonamides status: Secondary | ICD-10-CM

## 2023-01-12 DIAGNOSIS — Z8249 Family history of ischemic heart disease and other diseases of the circulatory system: Secondary | ICD-10-CM | POA: Diagnosis not present

## 2023-01-12 DIAGNOSIS — F32A Depression, unspecified: Secondary | ICD-10-CM | POA: Diagnosis present

## 2023-01-12 DIAGNOSIS — N390 Urinary tract infection, site not specified: Secondary | ICD-10-CM | POA: Diagnosis not present

## 2023-01-12 DIAGNOSIS — Z79899 Other long term (current) drug therapy: Secondary | ICD-10-CM

## 2023-01-12 DIAGNOSIS — J9600 Acute respiratory failure, unspecified whether with hypoxia or hypercapnia: Secondary | ICD-10-CM | POA: Diagnosis not present

## 2023-01-12 DIAGNOSIS — J4541 Moderate persistent asthma with (acute) exacerbation: Secondary | ICD-10-CM | POA: Diagnosis present

## 2023-01-12 DIAGNOSIS — G9589 Other specified diseases of spinal cord: Secondary | ICD-10-CM | POA: Diagnosis not present

## 2023-01-12 DIAGNOSIS — F39 Unspecified mood [affective] disorder: Secondary | ICD-10-CM | POA: Diagnosis present

## 2023-01-12 DIAGNOSIS — L400 Psoriasis vulgaris: Secondary | ICD-10-CM | POA: Diagnosis present

## 2023-01-12 DIAGNOSIS — I878 Other specified disorders of veins: Secondary | ICD-10-CM | POA: Diagnosis present

## 2023-01-12 DIAGNOSIS — Z91041 Radiographic dye allergy status: Secondary | ICD-10-CM

## 2023-01-12 DIAGNOSIS — E559 Vitamin D deficiency, unspecified: Secondary | ICD-10-CM | POA: Diagnosis not present

## 2023-01-12 DIAGNOSIS — Z881 Allergy status to other antibiotic agents status: Secondary | ICD-10-CM

## 2023-01-12 DIAGNOSIS — Z7401 Bed confinement status: Secondary | ICD-10-CM

## 2023-01-12 DIAGNOSIS — M4802 Spinal stenosis, cervical region: Secondary | ICD-10-CM | POA: Diagnosis not present

## 2023-01-12 DIAGNOSIS — Z886 Allergy status to analgesic agent status: Secondary | ICD-10-CM

## 2023-01-12 DIAGNOSIS — Z79891 Long term (current) use of opiate analgesic: Secondary | ICD-10-CM

## 2023-01-12 DIAGNOSIS — M5136 Other intervertebral disc degeneration, lumbar region: Secondary | ICD-10-CM | POA: Diagnosis not present

## 2023-01-12 DIAGNOSIS — Z743 Need for continuous supervision: Secondary | ICD-10-CM | POA: Diagnosis not present

## 2023-01-12 DIAGNOSIS — E871 Hypo-osmolality and hyponatremia: Secondary | ICD-10-CM | POA: Diagnosis not present

## 2023-01-12 DIAGNOSIS — J9611 Chronic respiratory failure with hypoxia: Secondary | ICD-10-CM | POA: Diagnosis present

## 2023-01-12 DIAGNOSIS — F1190 Opioid use, unspecified, uncomplicated: Secondary | ICD-10-CM | POA: Diagnosis present

## 2023-01-12 DIAGNOSIS — G4733 Obstructive sleep apnea (adult) (pediatric): Secondary | ICD-10-CM | POA: Diagnosis not present

## 2023-01-12 DIAGNOSIS — J9621 Acute and chronic respiratory failure with hypoxia: Secondary | ICD-10-CM | POA: Diagnosis not present

## 2023-01-12 DIAGNOSIS — G9529 Other cord compression: Secondary | ICD-10-CM | POA: Diagnosis not present

## 2023-01-12 DIAGNOSIS — R6889 Other general symptoms and signs: Secondary | ICD-10-CM | POA: Diagnosis not present

## 2023-01-12 DIAGNOSIS — Z887 Allergy status to serum and vaccine status: Secondary | ICD-10-CM

## 2023-01-12 DIAGNOSIS — J4540 Moderate persistent asthma, uncomplicated: Secondary | ICD-10-CM

## 2023-01-12 DIAGNOSIS — Z825 Family history of asthma and other chronic lower respiratory diseases: Secondary | ICD-10-CM | POA: Diagnosis not present

## 2023-01-12 DIAGNOSIS — Z8541 Personal history of malignant neoplasm of cervix uteri: Secondary | ICD-10-CM | POA: Diagnosis not present

## 2023-01-12 DIAGNOSIS — K760 Fatty (change of) liver, not elsewhere classified: Secondary | ICD-10-CM | POA: Diagnosis not present

## 2023-01-12 DIAGNOSIS — R509 Fever, unspecified: Secondary | ICD-10-CM | POA: Diagnosis not present

## 2023-01-12 DIAGNOSIS — G629 Polyneuropathy, unspecified: Secondary | ICD-10-CM | POA: Diagnosis not present

## 2023-01-12 DIAGNOSIS — R29898 Other symptoms and signs involving the musculoskeletal system: Secondary | ICD-10-CM | POA: Diagnosis not present

## 2023-01-12 DIAGNOSIS — I5033 Acute on chronic diastolic (congestive) heart failure: Secondary | ICD-10-CM | POA: Diagnosis not present

## 2023-01-12 DIAGNOSIS — G4730 Sleep apnea, unspecified: Secondary | ICD-10-CM | POA: Diagnosis not present

## 2023-01-12 DIAGNOSIS — E785 Hyperlipidemia, unspecified: Secondary | ICD-10-CM | POA: Diagnosis not present

## 2023-01-12 DIAGNOSIS — Z7951 Long term (current) use of inhaled steroids: Secondary | ICD-10-CM

## 2023-01-12 DIAGNOSIS — R0989 Other specified symptoms and signs involving the circulatory and respiratory systems: Secondary | ICD-10-CM | POA: Diagnosis not present

## 2023-01-12 DIAGNOSIS — I110 Hypertensive heart disease with heart failure: Principal | ICD-10-CM | POA: Diagnosis present

## 2023-01-12 DIAGNOSIS — Z888 Allergy status to other drugs, medicaments and biological substances status: Secondary | ICD-10-CM

## 2023-01-12 DIAGNOSIS — J9601 Acute respiratory failure with hypoxia: Secondary | ICD-10-CM | POA: Diagnosis not present

## 2023-01-12 DIAGNOSIS — E538 Deficiency of other specified B group vitamins: Secondary | ICD-10-CM | POA: Diagnosis not present

## 2023-01-12 DIAGNOSIS — Z90710 Acquired absence of both cervix and uterus: Secondary | ICD-10-CM

## 2023-01-12 DIAGNOSIS — R918 Other nonspecific abnormal finding of lung field: Secondary | ICD-10-CM | POA: Diagnosis not present

## 2023-01-12 DIAGNOSIS — K219 Gastro-esophageal reflux disease without esophagitis: Secondary | ICD-10-CM | POA: Diagnosis not present

## 2023-01-12 DIAGNOSIS — A419 Sepsis, unspecified organism: Secondary | ICD-10-CM | POA: Diagnosis not present

## 2023-01-12 DIAGNOSIS — L03116 Cellulitis of left lower limb: Secondary | ICD-10-CM | POA: Diagnosis not present

## 2023-01-12 DIAGNOSIS — G894 Chronic pain syndrome: Secondary | ICD-10-CM | POA: Diagnosis not present

## 2023-01-12 DIAGNOSIS — R0902 Hypoxemia: Secondary | ICD-10-CM | POA: Diagnosis not present

## 2023-01-12 DIAGNOSIS — J45901 Unspecified asthma with (acute) exacerbation: Secondary | ICD-10-CM | POA: Diagnosis present

## 2023-01-12 DIAGNOSIS — Z9049 Acquired absence of other specified parts of digestive tract: Secondary | ICD-10-CM

## 2023-01-12 DIAGNOSIS — J45909 Unspecified asthma, uncomplicated: Secondary | ICD-10-CM | POA: Diagnosis not present

## 2023-01-12 LAB — COMPREHENSIVE METABOLIC PANEL
ALT: 17 U/L (ref 0–44)
AST: 21 U/L (ref 15–41)
Albumin: 3 g/dL — ABNORMAL LOW (ref 3.5–5.0)
Alkaline Phosphatase: 100 U/L (ref 38–126)
Anion gap: 11 (ref 5–15)
BUN: 8 mg/dL (ref 6–20)
CO2: 37 mmol/L — ABNORMAL HIGH (ref 22–32)
Calcium: 8.2 mg/dL — ABNORMAL LOW (ref 8.9–10.3)
Chloride: 90 mmol/L — ABNORMAL LOW (ref 98–111)
Creatinine, Ser: 0.74 mg/dL (ref 0.44–1.00)
GFR, Estimated: >60 mL/min (ref >60)
Glucose, Bld: 84 mg/dL (ref 70–99)
Potassium: 2.7 mmol/L — CL (ref 3.5–5.1)
Sodium: 138 mmol/L (ref 135–145)
Total Bilirubin: 0.4 mg/dL (ref 0.3–1.2)
Total Protein: 6.7 g/dL (ref 6.5–8.1)

## 2023-01-12 LAB — CBC WITH DIFFERENTIAL/PLATELET
Abs Immature Granulocytes: 0.02 10*3/uL (ref 0.00–0.07)
Basophils Absolute: 0 10*3/uL (ref 0.0–0.1)
Basophils Relative: 1 %
Eosinophils Absolute: 0.4 10*3/uL (ref 0.0–0.5)
Eosinophils Relative: 6 %
HCT: 43.3 % (ref 36.0–46.0)
Hemoglobin: 13.2 g/dL (ref 12.0–15.0)
Immature Granulocytes: 0 %
Lymphocytes Relative: 20 %
Lymphs Abs: 1.3 10*3/uL (ref 0.7–4.0)
MCH: 29 pg (ref 26.0–34.0)
MCHC: 30.5 g/dL (ref 30.0–36.0)
MCV: 95.2 fL (ref 80.0–100.0)
Monocytes Absolute: 0.6 10*3/uL (ref 0.1–1.0)
Monocytes Relative: 9 %
Neutro Abs: 4.2 10*3/uL (ref 1.7–7.7)
Neutrophils Relative %: 64 %
Platelets: 203 10*3/uL (ref 150–400)
RBC: 4.55 MIL/uL (ref 3.87–5.11)
RDW: 14.6 % (ref 11.5–15.5)
WBC: 6.5 10*3/uL (ref 4.0–10.5)
nRBC: 0 % (ref 0.0–0.2)

## 2023-01-12 LAB — I-STAT VENOUS BLOOD GAS, ED
Acid-Base Excess: 15 mmol/L — ABNORMAL HIGH (ref 0.0–2.0)
Bicarbonate: 41.8 mmol/L — ABNORMAL HIGH (ref 20.0–28.0)
Calcium, Ion: 1.01 mmol/L — ABNORMAL LOW (ref 1.15–1.40)
HCT: 42 % (ref 36.0–46.0)
Hemoglobin: 14.3 g/dL (ref 12.0–15.0)
O2 Saturation: 91 %
Potassium: 2.7 mmol/L — CL (ref 3.5–5.1)
Sodium: 139 mmol/L (ref 135–145)
TCO2: 44 mmol/L — ABNORMAL HIGH (ref 22–32)
pCO2, Ven: 60.3 mmHg — ABNORMAL HIGH (ref 44–60)
pH, Ven: 7.448 — ABNORMAL HIGH (ref 7.25–7.43)
pO2, Ven: 61 mmHg — ABNORMAL HIGH (ref 32–45)

## 2023-01-12 LAB — BRAIN NATRIURETIC PEPTIDE: B Natriuretic Peptide: 14.7 pg/mL (ref 0.0–100.0)

## 2023-01-12 LAB — MAGNESIUM: Magnesium: 1.6 mg/dL — ABNORMAL LOW (ref 1.7–2.4)

## 2023-01-12 LAB — TROPONIN I (HIGH SENSITIVITY)
Troponin I (High Sensitivity): 7 ng/L (ref <18)
Troponin I (High Sensitivity): 7 ng/L (ref <18)

## 2023-01-12 MED ORDER — FUROSEMIDE 10 MG/ML IJ SOLN
40.0000 mg | Freq: Once | INTRAMUSCULAR | Status: AC
  Administered 2023-01-12: 40 mg via INTRAVENOUS
  Filled 2023-01-12: qty 4

## 2023-01-12 MED ORDER — SODIUM CHLORIDE 0.9% FLUSH
3.0000 mL | Freq: Two times a day (BID) | INTRAVENOUS | Status: DC
  Administered 2023-01-12 – 2023-01-14 (×4): 3 mL via INTRAVENOUS

## 2023-01-12 MED ORDER — BUPROPION HCL ER (XL) 300 MG PO TB24
300.0000 mg | ORAL_TABLET | Freq: Every day | ORAL | Status: DC
  Administered 2023-01-13 – 2023-01-14 (×2): 300 mg via ORAL
  Filled 2023-01-12 (×3): qty 1

## 2023-01-12 MED ORDER — CYCLOBENZAPRINE HCL 10 MG PO TABS
20.0000 mg | ORAL_TABLET | Freq: Two times a day (BID) | ORAL | Status: DC
  Administered 2023-01-12 – 2023-01-14 (×4): 20 mg via ORAL
  Filled 2023-01-12 (×4): qty 2

## 2023-01-12 MED ORDER — METHYLPREDNISOLONE SODIUM SUCC 40 MG IJ SOLR
40.0000 mg | Freq: Once | INTRAMUSCULAR | Status: AC
  Administered 2023-01-12: 40 mg via INTRAVENOUS
  Filled 2023-01-12: qty 1

## 2023-01-12 MED ORDER — PANTOPRAZOLE SODIUM 40 MG PO TBEC
40.0000 mg | DELAYED_RELEASE_TABLET | Freq: Two times a day (BID) | ORAL | Status: DC
  Administered 2023-01-12 – 2023-01-14 (×4): 40 mg via ORAL
  Filled 2023-01-12 (×4): qty 1

## 2023-01-12 MED ORDER — POTASSIUM CHLORIDE CRYS ER 20 MEQ PO TBCR
80.0000 meq | EXTENDED_RELEASE_TABLET | Freq: Once | ORAL | Status: AC
  Administered 2023-01-12: 80 meq via ORAL
  Filled 2023-01-12: qty 4

## 2023-01-12 MED ORDER — ENOXAPARIN SODIUM 40 MG/0.4ML IJ SOSY
40.0000 mg | PREFILLED_SYRINGE | INTRAMUSCULAR | Status: DC
  Administered 2023-01-12 – 2023-01-13 (×2): 40 mg via SUBCUTANEOUS
  Filled 2023-01-12 (×2): qty 0.4

## 2023-01-12 MED ORDER — ONDANSETRON HCL 4 MG PO TABS: 4.0000 mg | ORAL_TABLET | Freq: Four times a day (QID) | ORAL | Status: DC | PRN

## 2023-01-12 MED ORDER — SENNOSIDES-DOCUSATE SODIUM 8.6-50 MG PO TABS: 1.0000 | ORAL_TABLET | Freq: Every evening | ORAL | Status: DC | PRN

## 2023-01-12 MED ORDER — ARFORMOTEROL TARTRATE 15 MCG/2ML IN NEBU
15.0000 ug | INHALATION_SOLUTION | Freq: Two times a day (BID) | RESPIRATORY_TRACT | Status: DC
  Administered 2023-01-12 – 2023-01-14 (×4): 15 ug via RESPIRATORY_TRACT
  Filled 2023-01-12 (×4): qty 2

## 2023-01-12 MED ORDER — DULOXETINE HCL 60 MG PO CPEP
120.0000 mg | ORAL_CAPSULE | Freq: Every day | ORAL | Status: DC
  Administered 2023-01-13 – 2023-01-14 (×2): 120 mg via ORAL
  Filled 2023-01-12 (×2): qty 2

## 2023-01-12 MED ORDER — ALPRAZOLAM 0.5 MG PO TABS
0.5000 mg | ORAL_TABLET | Freq: Two times a day (BID) | ORAL | Status: DC
  Administered 2023-01-12 – 2023-01-14 (×4): 0.5 mg via ORAL
  Filled 2023-01-12 (×2): qty 1
  Filled 2023-01-12: qty 2
  Filled 2023-01-12: qty 1

## 2023-01-12 MED ORDER — OXCARBAZEPINE 300 MG PO TABS
300.0000 mg | ORAL_TABLET | Freq: Two times a day (BID) | ORAL | Status: DC
  Administered 2023-01-12 – 2023-01-14 (×4): 300 mg via ORAL
  Filled 2023-01-12 (×4): qty 1

## 2023-01-12 MED ORDER — PREDNISONE 20 MG PO TABS
40.0000 mg | ORAL_TABLET | Freq: Every day | ORAL | Status: DC
  Administered 2023-01-13 – 2023-01-14 (×2): 40 mg via ORAL
  Filled 2023-01-12 (×2): qty 2

## 2023-01-12 MED ORDER — GABAPENTIN 300 MG PO CAPS
600.0000 mg | ORAL_CAPSULE | Freq: Two times a day (BID) | ORAL | Status: DC | PRN
  Administered 2023-01-13: 600 mg via ORAL
  Filled 2023-01-12: qty 2

## 2023-01-12 MED ORDER — NEBIVOLOL HCL 10 MG PO TABS
10.0000 mg | ORAL_TABLET | Freq: Every day | ORAL | Status: DC
  Administered 2023-01-13 – 2023-01-14 (×2): 10 mg via ORAL
  Filled 2023-01-12 (×2): qty 1

## 2023-01-12 MED ORDER — ACETAMINOPHEN 650 MG RE SUPP: 650.0000 mg | Freq: Four times a day (QID) | RECTAL | Status: DC | PRN

## 2023-01-12 MED ORDER — ACETAMINOPHEN 325 MG PO TABS
650.0000 mg | ORAL_TABLET | Freq: Four times a day (QID) | ORAL | Status: DC | PRN
  Administered 2023-01-13 – 2023-01-14 (×3): 650 mg via ORAL
  Filled 2023-01-12 (×3): qty 2

## 2023-01-12 MED ORDER — ARIPIPRAZOLE 5 MG PO TABS
15.0000 mg | ORAL_TABLET | Freq: Every day | ORAL | Status: DC
  Administered 2023-01-13 – 2023-01-14 (×2): 15 mg via ORAL
  Filled 2023-01-12 (×2): qty 1

## 2023-01-12 MED ORDER — IPRATROPIUM-ALBUTEROL 0.5-2.5 (3) MG/3ML IN SOLN: 3.0000 mL | Freq: Four times a day (QID) | RESPIRATORY_TRACT | Status: DC | PRN

## 2023-01-12 MED ORDER — MODAFINIL 100 MG PO TABS
300.0000 mg | ORAL_TABLET | Freq: Two times a day (BID) | ORAL | Status: DC
  Administered 2023-01-13 – 2023-01-14 (×3): 300 mg via ORAL
  Filled 2023-01-12 (×5): qty 3

## 2023-01-12 MED ORDER — BUDESONIDE 0.25 MG/2ML IN SUSP
0.2500 mg | Freq: Two times a day (BID) | RESPIRATORY_TRACT | Status: DC
  Administered 2023-01-12 – 2023-01-14 (×4): 0.25 mg via RESPIRATORY_TRACT
  Filled 2023-01-12 (×4): qty 2

## 2023-01-12 MED ORDER — ONDANSETRON HCL 4 MG/2ML IJ SOLN: 4.0000 mg | Freq: Four times a day (QID) | INTRAMUSCULAR | Status: DC | PRN

## 2023-01-12 MED ORDER — CLONAZEPAM 0.5 MG PO TABS
1.0000 mg | ORAL_TABLET | Freq: Every day | ORAL | Status: DC
  Administered 2023-01-12 – 2023-01-13 (×2): 1 mg via ORAL
  Filled 2023-01-12 (×2): qty 2

## 2023-01-12 MED ORDER — IPRATROPIUM-ALBUTEROL 0.5-2.5 (3) MG/3ML IN SOLN
3.0000 mL | Freq: Once | RESPIRATORY_TRACT | Status: AC
  Administered 2023-01-12: 3 mL via RESPIRATORY_TRACT
  Filled 2023-01-12: qty 3

## 2023-01-12 MED ORDER — MAGNESIUM SULFATE 2 GM/50ML IV SOLN
2.0000 g | Freq: Once | INTRAVENOUS | Status: AC
  Administered 2023-01-12: 2 g via INTRAVENOUS
  Filled 2023-01-12: qty 50

## 2023-01-12 MED ORDER — HYDROCODONE-ACETAMINOPHEN 10-325 MG PO TABS
1.0000 | ORAL_TABLET | Freq: Three times a day (TID) | ORAL | Status: DC | PRN
  Administered 2023-01-13: 1 via ORAL
  Filled 2023-01-12: qty 1

## 2023-01-12 NOTE — ED Triage Notes (Addendum)
Patient arrives by EMS with c/o low oxygen.   Patient has physical therapy that has been coming in and have notice the past 2-3 days oxygen levels have been low.   Per EMS oxygen saturations were 78% on RA and came up to 94% when placed on 2L [place].   Patient is not on oxygen at home, just uses CPAP at night.  Patient has history of CHF.  Patient denies shortness of breath.

## 2023-01-12 NOTE — Hospital Course (Signed)
[name] is a 52 y.o. female with medical history significant for cervical cord myelomalacia with chronic bedbound status, HFpEF (EF 60-65%), asthma, polyarteritis nodosa, plaque psoriasis, depression/anxiety, chronic pain on chronic opioid therapy, fibromyalgia, morbid obesity, chronic lower extremity edema with venous stasis, [name] on CPAP who is admitted with acute respiratory failure with hypoxia due to asthma exacerbation.

## 2023-01-12 NOTE — Telephone Encounter (Signed)
[name] from [facility] called to let us know that pt has been sent to ER due to her Oxygen levels

## 2023-01-12 NOTE — H&P (Signed)
History and Physical    [name] ZOX:[id number] DOB: Sep 17, 1970 DOA: 01/12/2023  PCP: Sheliah Hatch, MD  Patient coming from: Home  I have personally briefly reviewed patient's old medical records in [name]  Chief Complaint: Hypoxia  HPI: [name] is a 52 y.o. female with medical history significant for cervical cord myelomalacia with chronic bedbound status, HFpEF (EF 60-65%), asthma, polyarteritis nodosa, plaque psoriasis, depression/anxiety, chronic pain on chronic opioid therapy, fibromyalgia, morbid obesity, chronic lower extremity edema with venous stasis, [name] on CPAP who presented to the ED for evaluation of hypoxia.  Patient last admitted 11/20/2022-11/24/2022 for acute hypoxic respiratory failure due to acute on chronic HFpEF.  She was diuresed adequately and discharged to home on room air with prescription for Lasix 40 mg daily.  Patient states that she has been feeling well.  She has been working with PT.  The last 2 days they have noted that her SpO2 has been dropping into the 80s.  Patient states that she has really not had any respiratory symptoms.  EMS were called today and she was noted to have SpO2 78% on room air.  She was placed on 2 L via [place] with improvement at 94% and brought to the ED for further evaluation.  Patient states that she has noted some wheezing today but otherwise no significant dyspnea, cough, fevers, chills, diaphoresis, chest pain, palpitations.  She has chronic edema to bilateral lower extremities with venous stasis changes and she believes that this actually looks better than her baseline.  She reports adherence to Lasix 40 mg daily and reports good urine output.  She denies nausea, vomiting, abdominal pain.  ED Course  Labs/Imaging on admission: I have personally reviewed following labs and imaging studies.  Initial vitals showed BP 135/82, pulse 78, RR 26, temp 98.8 F, SpO2 99% on 3 L O2 via [place].  Labs show sodium 138, potassium  2.7, bicarb 37, BUN 8, creatinine 0.74, serum glucose 84, LFTs within normal limits, troponin 7, BNP 14.7, WBC 6.5, hemoglobin 13.2, platelets 203,000.  Portable chest x-ray shows low lung volumes with mild central pulmonary vascular congestion.  Elevated right hemidiaphragm with compressive atelectatic changes at the right lung base noted.  Patient was given DuoNeb treatment, IV Lasix 40 mg, oral K 80 mEq.  The hospitalist service was consulted to admit for further evaluation and management.  Review of Systems: All systems reviewed and are negative except as documented in history of present illness above.   Past Medical History:  Diagnosis Date   Allergic rhinitis    Ankle fracture, right    Anxiety    ASCUS of cervix with negative high risk HPV 05/2018   Carpal tunnel syndrome    Cervical cancer (HCC) 1998   Fibromyalgia    Hyperlipidemia    Hypertension    Migraine headache    Obesity    Sleep apnea     Past Surgical History:  Procedure Laterality Date   ABDOMINAL HYSTERECTOMY  10/1996   ABDOMINAL SURGERY  jan/11/2012   gastric bypass surgery   CARPAL TUNNEL RELEASE     bilateral    CESAREAN SECTION     CHOLECYSTECTOMY  07/1992   ESOPHAGOGASTRODUODENOSCOPY N/A 01/14/2016   Procedure: ESOPHAGOGASTRODUODENOSCOPY (EGD);  Surgeon: Napoleon Form, MD;  Location: [facility] ENDOSCOPY;  Service: Endoscopy;  Laterality: N/A;    Social History:  reports that she has never smoked. She has never used smokeless tobacco. She reports that she does not drink  alcohol and does not use drugs.  Allergies  Allergen Reactions   Niacin Anaphylaxis, Shortness Of Breath and Swelling    Swelling and "problems breathing"   Sulfamethoxazole-Trimethoprim Anaphylaxis, Swelling, Rash and Other (See Comments)    Throat closed and the eyes became swollen   Aspirin Hives   Bactrim Hives   Benzoin Compound Rash   Cephalexin Hives    Tolerated ceftriaxone and cefepime 07/2019   Iohexol Hives    Pt  treated with PO benedryl   Lisinopril Hives   Naproxen Hives   Pnu-Imune [Pneumococcal Polysaccharide Vaccine] Rash   Sulfonamide Derivatives Rash    Family History  Problem Relation Age of Onset   Diabetes Mother    Emphysema Mother    Allergies Mother    Hypertension Mother    Heart disease Father    Allergies Father    Prostate cancer Father    Breast cancer Maternal Grandmother 15     Prior to Admission medications   Medication Sig Start Date End Date Taking? Authorizing Provider  albuterol (VENTOLIN HFA) 108 (90 Base) MCG/ACT inhaler Inhale 2 puffs into the lungs every 6 (six) hours as needed for wheezing or shortness of breath. 07/05/22   Sheliah Hatch, MD  ALPRAZolam Prudy Feeler) 0.5 MG tablet Take 1 tablet (0.5 mg total) by mouth 2 (two) times daily. 10/20/22   Sheliah Hatch, MD  ARIPiprazole (ABILIFY) 15 MG tablet TAKE 1 TABLET BY MOUTH EVERY DAY 07/29/22   Sheliah Hatch, MD  Armodafinil 250 MG tablet Take 1 tablet (250 mg total) by mouth in the morning and at bedtime. 08/10/22   Sheliah Hatch, MD  Biotin 10 MG TABS Take 1 tablet (10 mg total) by mouth in the morning. 08/10/22   Sheliah Hatch, MD  buPROPion (WELLBUTRIN XL) 300 MG 24 hr tablet TAKE 1 TABLET BY MOUTH EVERY DAY 01/03/23   Sheliah Hatch, MD  calcium citrate (CALCITRATE - DOSED IN MG ELEMENTAL CALCIUM) 950 (200 Ca) MG tablet Take 1 tablet (200 mg of elemental calcium total) by mouth daily. 10/25/22   Sheliah Hatch, MD  clonazePAM (KLONOPIN) 1 MG tablet TAKE 1 TABLET BY MOUTH AT BEDTIME 12/08/22   Sheliah Hatch, MD  cyanocobalamin (VITAMIN B12) 1000 MCG tablet TAKE 1 TABLET BY MOUTH EVERY DAY 01/03/23   Sheliah Hatch, MD  cyclobenzaprine (FLEXERIL) 10 MG tablet TAKE 2 TABLETS BY MOUTH 2 TIMES DAILY 01/03/23   Sheliah Hatch, MD  diclofenac Sodium (VOLTAREN) 1 % GEL Apply 4 g topically 4 (four) times daily. 08/10/22   Sheliah Hatch, MD  doxycycline (VIBRA-TABS) 100 MG  tablet Take 1 tablet (100 mg total) by mouth 2 (two) times daily. 12/16/22   Sheliah Hatch, MD  DULoxetine (CYMBALTA) 60 MG capsule Take 2 capsules (120 mg total) by mouth daily. Patient taking differently: Take 60 mg by mouth 2 (two) times daily. Pt reported taking two capsules(120mg ) once daily 06/22/15   Sheliah Hatch, MD  FEROSUL 325 (65 Fe) MG tablet TAKE 1 TABLET BY MOUTH EVERY MORNING WITH BREAKFAST 01/03/23   Sheliah Hatch, MD  fluticasone-salmeterol (ADVAIR DISKUS) 100-50 MCG/ACT AEPB Inhale 1 puff into the lungs 2 (two) times daily. 10/27/22   Luciano Cutter, MD  folic acid (FOLVITE) 1 MG tablet TAKE 1 TABLET BY MOUTH EVERY DAY 01/03/23   Sheliah Hatch, MD  furosemide (LASIX) 40 MG tablet TAKE 1 TABLET BY MOUTH EVERY DAY MAY take additional TABLET  IF weight INCREASE by 5lb AND call MD FOR further instructions 01/10/23   Sheliah Hatch, MD  gabapentin (NEURONTIN) 600 MG tablet Take 1 tablet (600 mg total) by mouth 2 (two) times daily as needed. Pt takes daily - can take up to twice daily 11/24/22   Noralee Stain, DO  GNP VITAMIN B-1 100 MG tablet TAKE 1 TABLET BY MOUTH EVERY DAY 01/03/23   Sheliah Hatch, MD  HYDROcodone-acetaminophen (NORCO) 10-325 MG tablet TAKE 1 TABLET BY MOUTH EVERY 8 HOURS AS NEEDED 01/04/23   Sheliah Hatch, MD  nebivolol (BYSTOLIC) 10 MG tablet TAKE 1 TABLET BY MOUTH EVERY DAY 01/03/23   Sheliah Hatch, MD  nystatin (MYCOSTATIN/NYSTOP) powder APPLY 1 APPLICATION TOPICALLY 3 TIMES DAILY 08/17/22   Sheliah Hatch, MD  Oxcarbazepine (TRILEPTAL) 300 MG tablet Take 300 mg by mouth 2 (two) times daily.    [provider]  pantoprazole (PROTONIX) 40 MG tablet TAKE 1 TABLET BY MOUTH 2 TIMES DAILY 01/03/23   Sheliah Hatch, MD  phentermine 37.5 MG capsule Take 1 capsule (37.5 mg total) by mouth every morning. 06/30/22   Sheliah Hatch, MD  potassium chloride (KLOR-CON) 20 MEQ packet Take 1 packet by mouth daily. Take with  lasix. 11/24/22 01/23/23  Noralee Stain, DO  promethazine (PHENERGAN) 25 MG tablet TAKE 1 TABLET BY MOUTH EVERY 6 HOURS AS NEEDED FOR NAUSEA AND VOMITING 08/17/22   Sheliah Hatch, MD  pyridOXINE (VITAMIN B6) 100 MG tablet TAKE 1 TABLET BY MOUTH EVERY DAY 01/03/23   Sheliah Hatch, MD    Physical Exam: Vitals:   01/12/23 1700 01/12/23 1730 01/12/23 1800 01/12/23 1830  BP: 131/81 (!) 114/98 114/80 110/61  Pulse: 75 77 76 73  Resp: 20 17 14 17   Temp:      TempSrc:      SpO2: 98% 98% 100% 98%   Constitutional: Morbidly obese woman resting in bed, NAD, calm, comfortable Eyes: EOMI, lids and conjunctivae normal ENMT: Mucous membranes are moist. Posterior pharynx clear of any exudate or lesions.Normal dentition.  Neck: normal, supple, no masses. Respiratory: End expiratory wheezing bilaterally. Normal respiratory effort while on 2 L O2 via [place]. No accessory muscle use.  Cardiovascular: Regular rate and rhythm, no murmurs / rubs / gallops.  Chronic stasis changes bilateral lower extremities without significant pitting edema.  Abdomen: no tenderness, no masses palpated. Musculoskeletal: no clubbing / cyanosis. No joint deformity upper and lower extremities. Good ROM, no contractures. Normal muscle tone.  Skin: Chronic venous stasis changes bilateral lower extremities Neurologic: Sensation intact. Strength 5/5 in all 4.  Psychiatric: Normal judgment and insight. Alert and oriented x 3. Normal mood.   EKG: Personally reviewed. Sinus rhythm, rate 83, no acute ischemic changes.  Similar to previous.  Assessment/Plan Principal Problem:   Acute respiratory failure with hypoxia (HCC) Active Problems:   Asthma, chronic, unspecified asthma severity, with acute exacerbation   Acute on chronic heart failure with preserved ejection fraction (HFpEF) (HCC)   [name] (obstructive sleep apnea)   Chronic narcotic use   Compressive cervical cord myelomalacia (HCC)   Hypokalemia   Hypomagnesemia   NAIKA TOTZKE is a 52 y.o. female with medical history significant for cervical cord myelomalacia with chronic bedbound status, HFpEF (EF 60-65%), asthma, polyarteritis nodosa, plaque psoriasis, depression/anxiety, chronic pain on chronic opioid therapy, fibromyalgia, morbid obesity, chronic lower extremity edema with venous stasis, [name] on CPAP who is admitted with acute respiratory failure with hypoxia due to  asthma exacerbation.  Assessment and Plan: Acute asthma exacerbation: Wheezing noted on admission post DuoNeb treatment.  No new cough or sputum production. -IV Solu-Medrol once now followed by prednisone 40 mg tomorrow -Scheduled Brovana/Pulmicort, DuoNebs as needed -Incentive spirometer, supplemental O2 as needed  Acute on chronic HFpEF: Some increased pulmonary vascular congestion on CT x-ray likely contributing to acute hypoxia.  TTE 11/21/2022 showed EF 60-65%.  Chronic stasis changes bilateral lower extremity noted. -IV Lasix 40 mg tonight -Monitor I/O's -Continue Bystolic 10 mg daily  Acute respiratory failure with hypoxia: New supplemental O2 requirement of 2 L via [place] on admission secondary to above.  SpO2 as low as 78% on RA with EMS. -Continue management as above and wean supplemental O2 as able  Hypokalemia/hypomagnesemia: Supplemented, repeat labs in AM.  Mood disorder: Continue home Xanax, Abilify, Wellbutrin, Klonopin, Cymbalta, Trileptal.  Chronic pain syndrome: Continue gabapentin, home Norco with hold parameters.  [name]: Continue CPAP nightly, patient reports adherence.  Cervical cord myelomalacia: Chronic bedbound status.  Continue Flexeril.  Patient states she is working with PT/OT at home.   DVT prophylaxis: enoxaparin (LOVENOX) injection 40 mg Start: 01/12/23 1930 Code Status: DNR (no CPR but would allow prearrest intubation and otherwise full scope of care if necessary) Family Communication: Discussed with patient, she has discussed with family Disposition Plan:  From home and likely discharge to home pending clinical progress Consults called: None Severity of Illness: The appropriate patient status for this patient is INPATIENT. Inpatient status is judged to be reasonable and necessary in order to provide the required intensity of service to ensure the patient's safety. The patient's presenting symptoms, physical exam findings, and initial radiographic and laboratory data in the context of their chronic comorbidities is felt to place them at high risk for further clinical deterioration. Furthermore, it is not anticipated that the patient will be medically stable for discharge from the hospital within 2 midnights of admission.   * I certify that at the point of admission it is my clinical judgment that the patient will require inpatient hospital care spanning beyond 2 midnights from the point of admission due to high intensity of service, high risk for further deterioration and high frequency of surveillance required.Darreld Mclean MD [facility]  If 7PM-7AM, please contact night-coverage [email or web address]  01/12/2023, 7:33 PM

## 2023-01-12 NOTE — ED Provider Notes (Signed)
[place] EMERGENCY DEPARTMENT AT [facility] Provider Note   CSN: [id number] Arrival date & time: 01/12/23  1455     History  Chief Complaint  Patient presents with   New O2 need, Low sats    [name] is a 52 y.o. female.  Pt is a 52y/o female with hx of cervical cord myelomalacia with bedbound status, asthma, HTN, polyarteritis nodosa, depression/anxiety, chronic pain on chronic opioid therapy, fibromyalgia, morbid obesity, [name] and last hospitalization in July with acute CHF with preserved EF of 60 to 65% which responded well to diuresis who has been on Lasix since her discharge in July who is presenting today due to hypoxia.  Patient reports that she overall feels fine but they have noticed that her oxygen keeps dropping.  She also noticed today she started wheezing some.  She has not been checking her weight and is unclear if her weight has gone up.  She has been compliant with her 40 mg of Lasix daily.  She denies any abdominal pain, nausea or vomiting, chest pain.  She denies cough or fever.  She reports not feeling short of breath and she has been compliant with her CPAP at night.  Physical therapy came to work with the patient today and reported her oxygen dropped into the 70s.  When EMS got there patient's O2 sat was in the 70s and she was placed on 2 L with improvement.  Here she was removed just while she was moving over from the bed and oxygen dropped into the mid 80s.  Patient is currently on 3 L of oxygen which she does not wear at home.  She does use a Dulera inhaler daily.  The history is provided by the patient and the EMS personnel.       Home Medications Prior to Admission medications   Medication Sig Start Date End Date Taking? Authorizing Provider  albuterol (VENTOLIN HFA) 108 (90 Base) MCG/ACT inhaler Inhale 2 puffs into the lungs every 6 (six) hours as needed for wheezing or shortness of breath. 07/05/22   Sheliah Hatch, MD  ALPRAZolam Prudy Feeler)  0.5 MG tablet Take 1 tablet (0.5 mg total) by mouth 2 (two) times daily. 10/20/22   Sheliah Hatch, MD  ARIPiprazole (ABILIFY) 15 MG tablet TAKE 1 TABLET BY MOUTH EVERY DAY 07/29/22   Sheliah Hatch, MD  Armodafinil 250 MG tablet Take 1 tablet (250 mg total) by mouth in the morning and at bedtime. 08/10/22   Sheliah Hatch, MD  Biotin 10 MG TABS Take 1 tablet (10 mg total) by mouth in the morning. 08/10/22   Sheliah Hatch, MD  buPROPion (WELLBUTRIN XL) 300 MG 24 hr tablet TAKE 1 TABLET BY MOUTH EVERY DAY 01/03/23   Sheliah Hatch, MD  calcium citrate (CALCITRATE - DOSED IN MG ELEMENTAL CALCIUM) 950 (200 Ca) MG tablet Take 1 tablet (200 mg of elemental calcium total) by mouth daily. 10/25/22   Sheliah Hatch, MD  clonazePAM (KLONOPIN) 1 MG tablet TAKE 1 TABLET BY MOUTH AT BEDTIME 12/08/22   Sheliah Hatch, MD  cyanocobalamin (VITAMIN B12) 1000 MCG tablet TAKE 1 TABLET BY MOUTH EVERY DAY 01/03/23   Sheliah Hatch, MD  cyclobenzaprine (FLEXERIL) 10 MG tablet TAKE 2 TABLETS BY MOUTH 2 TIMES DAILY 01/03/23   Sheliah Hatch, MD  diclofenac Sodium (VOLTAREN) 1 % GEL Apply 4 g topically 4 (four) times daily. 08/10/22   Sheliah Hatch, MD  doxycycline (VIBRA-TABS)  100 MG tablet Take 1 tablet (100 mg total) by mouth 2 (two) times daily. 12/16/22   Sheliah Hatch, MD  DULoxetine (CYMBALTA) 60 MG capsule Take 2 capsules (120 mg total) by mouth daily. Patient taking differently: Take 60 mg by mouth 2 (two) times daily. Pt reported taking two capsules(120mg ) once daily 06/22/15   Sheliah Hatch, MD  FEROSUL 325 (65 Fe) MG tablet TAKE 1 TABLET BY MOUTH EVERY MORNING WITH BREAKFAST 01/03/23   Sheliah Hatch, MD  fluticasone-salmeterol (ADVAIR DISKUS) 100-50 MCG/ACT AEPB Inhale 1 puff into the lungs 2 (two) times daily. 10/27/22   Luciano Cutter, MD  folic acid (FOLVITE) 1 MG tablet TAKE 1 TABLET BY MOUTH EVERY DAY 01/03/23   Sheliah Hatch, MD  furosemide  (LASIX) 40 MG tablet TAKE 1 TABLET BY MOUTH EVERY DAY MAY take additional TABLET IF weight INCREASE by 5lb AND call MD FOR further instructions 01/10/23   Sheliah Hatch, MD  gabapentin (NEURONTIN) 600 MG tablet Take 1 tablet (600 mg total) by mouth 2 (two) times daily as needed. Pt takes daily - can take up to twice daily 11/24/22   Noralee Stain, DO  GNP VITAMIN B-1 100 MG tablet TAKE 1 TABLET BY MOUTH EVERY DAY 01/03/23   Sheliah Hatch, MD  HYDROcodone-acetaminophen (NORCO) 10-325 MG tablet TAKE 1 TABLET BY MOUTH EVERY 8 HOURS AS NEEDED 01/04/23   Sheliah Hatch, MD  nebivolol (BYSTOLIC) 10 MG tablet TAKE 1 TABLET BY MOUTH EVERY DAY 01/03/23   Sheliah Hatch, MD  nystatin (MYCOSTATIN/NYSTOP) powder APPLY 1 APPLICATION TOPICALLY 3 TIMES DAILY 08/17/22   Sheliah Hatch, MD  Oxcarbazepine (TRILEPTAL) 300 MG tablet Take 300 mg by mouth 2 (two) times daily.    [provider]  pantoprazole (PROTONIX) 40 MG tablet TAKE 1 TABLET BY MOUTH 2 TIMES DAILY 01/03/23   Sheliah Hatch, MD  phentermine 37.5 MG capsule Take 1 capsule (37.5 mg total) by mouth every morning. 06/30/22   Sheliah Hatch, MD  potassium chloride (KLOR-CON) 20 MEQ packet Take 1 packet by mouth daily. Take with lasix. 11/24/22 01/23/23  Noralee Stain, DO  promethazine (PHENERGAN) 25 MG tablet TAKE 1 TABLET BY MOUTH EVERY 6 HOURS AS NEEDED FOR NAUSEA AND VOMITING 08/17/22   Sheliah Hatch, MD  pyridOXINE (VITAMIN B6) 100 MG tablet TAKE 1 TABLET BY MOUTH EVERY DAY 01/03/23   Sheliah Hatch, MD      Allergies    Niacin, Sulfamethoxazole-trimethoprim, Aspirin, Bactrim, Benzoin compound, Cephalexin, Iohexol, Lisinopril, Naproxen, Pnu-imune [pneumococcal polysaccharide vaccine], and Sulfonamide derivatives    Review of Systems   Review of Systems  Physical Exam Updated Vital Signs BP 114/80   Pulse 76   Temp 98.8 F (37.1 C) (Oral)   Resp 14   SpO2 100%  Physical Exam Vitals and nursing note  reviewed.  Constitutional:      General: She is not in acute distress.    Appearance: She is well-developed. She is obese.  HENT:     Head: Normocephalic and atraumatic.  Eyes:     Pupils: Pupils are equal, round, and reactive to light.  Cardiovascular:     Rate and Rhythm: Normal rate and regular rhythm.     Heart sounds: Normal heart sounds. No murmur heard.    No friction rub.  Pulmonary:     Effort: Pulmonary effort is normal.     Breath sounds: Wheezing present. No rales.     Comments:  Diffuse audible expiratory wheezes Abdominal:     General: Bowel sounds are normal. There is no distension.     Palpations: Abdomen is soft.     Tenderness: There is no abdominal tenderness. There is no guarding or rebound.  Musculoskeletal:        General: No tenderness. Normal range of motion.     Right lower leg: Edema present.     Left lower leg: Edema present.     Comments: Mild weeping in the lower legs.  Skin changes consistent with chronic venous stasis.  Mild redness to the lower legs bilaterally but not hot to the touch  Skin:    General: Skin is warm and dry.     Findings: No rash.  Neurological:     Mental Status: She is alert and oriented to person, place, and time.     Cranial Nerves: No cranial nerve deficit.  Psychiatric:        Behavior: Behavior normal.     ED Results / Procedures / Treatments   Labs (all labs ordered are listed, but only abnormal results are displayed) Labs Reviewed  COMPREHENSIVE METABOLIC PANEL - Abnormal; Notable for the following components:      Result Value   Potassium 2.7 (*)    Chloride 90 (*)    CO2 37 (*)    Calcium 8.2 (*)    Albumin 3.0 (*)    All other components within normal limits  I-STAT VENOUS BLOOD GAS, ED - Abnormal; Notable for the following components:   pH, Ven 7.448 (*)    pCO2, Ven 60.3 (*)    pO2, Ven 61 (*)    Bicarbonate 41.8 (*)    TCO2 44 (*)    Acid-Base Excess 15.0 (*)    Potassium 2.7 (*)    Calcium, Ion  1.01 (*)    All other components within normal limits  CBC WITH DIFFERENTIAL/PLATELET  BRAIN NATRIURETIC PEPTIDE  TROPONIN I (HIGH SENSITIVITY)  TROPONIN I (HIGH SENSITIVITY)    EKG EKG Interpretation Date/Time:  Thursday January 12 2023 15:12:56 EDT Ventricular Rate:  83 PR Interval:  152 QRS Duration:  94 QT Interval:  377 QTC Calculation: 443 R Axis:   42  Text Interpretation: Sinus rhythm No significant change since last tracing Confirmed by Gwyneth Sprout ([id number]) on 01/12/2023 3:41:04 PM  Radiology DG Chest Port 1 View  Result Date: 01/12/2023 CLINICAL DATA:  hypoxia. Low oxygen saturation. Sleep apnea. Obesity. EXAM: PORTABLE CHEST 1 VIEW COMPARISON:  11/22/2022. FINDINGS: Low lung volume. Mild central pulmonary vascular congestion, likely accentuated by low lung volume. No frank pulmonary edema. Elevated right hemidiaphragm, similar to the prior study. There are compressive atelectatic changes at the right lung base, also similar to the prior study. Bilateral lung fields are otherwise clear. Bilateral lateral costophrenic angles are clear. Stable cardio-mediastinal silhouette. No acute osseous abnormalities. The soft tissues are within normal limits. IMPRESSION: 1. Low lung volume. Mild central pulmonary vascular congestion. 2. Elevated right hemidiaphragm with compressive atelectatic changes at the right lung base. Electronically Signed   By: Jules Schick M.D.   On: 01/12/2023 15:54    Procedures Procedures    Medications Ordered in ED Medications  potassium chloride SA (KLOR-CON M) CR tablet 80 mEq (has no administration in time range)  furosemide (LASIX) injection 40 mg (has no administration in time range)  ipratropium-albuterol (DUONEB) 0.5-2.5 (3) MG/3ML nebulizer solution 3 mL (3 mLs Nebulization Given 01/12/23 1625)    ED Course/ Medical Decision Making/  A&P                                 Medical Decision Making Amount and/or Complexity of Data  Reviewed Independent Historian: EMS External Data Reviewed: notes. Labs: ordered. Decision-making details documented in ED Course. Radiology: ordered and independent interpretation performed. Decision-making details documented in ED Course. ECG/medicine tests: ordered and independent interpretation performed. Decision-making details documented in ED Course.  Risk Prescription drug management. Decision regarding hospitalization.   Pt with multiple medical problems and comorbidities and presenting today with a complaint that caries a high risk for morbidity and mortality.  Here today with hypoxia.  Concern for CHF exacerbation versus pneumonia versus pulmonary hypertension versus PE versus asthma exacerbation.  Lower suspicion for infectious causes at this time as patient does not have a cough, fever or other infectious complaints.  Patient is audibly wheezing which could be fluid overload versus asthma exacerbation.  Patient was placed on 3 L of oxygen with improvement of hypoxia.  Labs are pending.  Patient given a DuoNeb and will reassess.  6:28 PM Mild improvement after DuoNeb and wheezing but patient still desats when oxygen removed.  I independently interpreted patient's labs and EKG.  EKG without acute findings, CBC without acute findings, CMP with hypokalemia of 2.7 most likely from patient's albuterol and Lasix but otherwise normal creatinine, ABG with compensated respiratory acidosis, BNP normal at 14 and troponin is negative.  I have independently visualized and interpreted pt's images today.  Chest x-ray with mild vascular congestion.  Suspect that patient may have a combination of asthma and CHF given prior history.  Still have been unable to obtain a weight at this time.  However given patient's new oxygen requirement will admit for respiratory failure with hypoxia.  Will continue DuoNebs as needed and given IV Lasix.  Will admit to the hospital for further care.  Discussed this with the  patient she is comfortable with this plan.  CRITICAL CARE Performed by: Shylie Polo Total critical care time: 30 minutes Critical care time was exclusive of separately billable procedures and treating other patients. Critical care was necessary to treat or prevent imminent or life-threatening deterioration. Critical care was time spent personally by me on the following activities: development of treatment plan with patient and/or surrogate as well as nursing, discussions with consultants, evaluation of patient's response to treatment, examination of patient, obtaining history from patient or surrogate, ordering and performing treatments and interventions, ordering and review of laboratory studies, ordering and review of radiographic studies, pulse oximetry and re-evaluation of patient's condition.           Final Clinical Impression(s) / ED Diagnoses Final diagnoses:  Acute on chronic respiratory failure with hypoxia (HCC)  Acute congestive heart failure, unspecified heart failure type (HCC)  Moderate persistent asthma, unspecified whether complicated    Rx / DC Orders ED Discharge Orders     None         Gwyneth Sprout, MD 01/12/23 1828

## 2023-01-13 ENCOUNTER — Other Ambulatory Visit: Payer: Self-pay | Admitting: Family Medicine

## 2023-01-13 DIAGNOSIS — J9601 Acute respiratory failure with hypoxia: Secondary | ICD-10-CM | POA: Diagnosis not present

## 2023-01-13 LAB — BASIC METABOLIC PANEL WITH GFR
Anion gap: 13 (ref 5–15)
BUN: 6 mg/dL (ref 6–20)
CO2: 35 mmol/L — ABNORMAL HIGH (ref 22–32)
Calcium: 8.3 mg/dL — ABNORMAL LOW (ref 8.9–10.3)
Chloride: 90 mmol/L — ABNORMAL LOW (ref 98–111)
Creatinine, Ser: 0.73 mg/dL (ref 0.44–1.00)
GFR, Estimated: >60 mL/min
Glucose, Bld: 140 mg/dL — ABNORMAL HIGH (ref 70–99)
Potassium: 3.3 mmol/L — ABNORMAL LOW (ref 3.5–5.1)
Sodium: 138 mmol/L (ref 135–145)

## 2023-01-13 LAB — CBC
HCT: 45 % (ref 36.0–46.0)
Hemoglobin: 13.7 g/dL (ref 12.0–15.0)
MCH: 28.3 pg (ref 26.0–34.0)
MCHC: 30.4 g/dL (ref 30.0–36.0)
MCV: 93 fL (ref 80.0–100.0)
Platelets: 212 10*3/uL (ref 150–400)
RBC: 4.84 MIL/uL (ref 3.87–5.11)
RDW: 14.6 % (ref 11.5–15.5)
WBC: 7.1 10*3/uL (ref 4.0–10.5)
nRBC: 0 % (ref 0.0–0.2)

## 2023-01-13 LAB — MAGNESIUM: Magnesium: 2.1 mg/dL (ref 1.7–2.4)

## 2023-01-13 MED ORDER — FUROSEMIDE 10 MG/ML IJ SOLN
40.0000 mg | Freq: Every day | INTRAMUSCULAR | Status: AC
  Administered 2023-01-13 – 2023-01-14 (×2): 40 mg via INTRAVENOUS
  Filled 2023-01-13 (×2): qty 4

## 2023-01-13 MED ORDER — POTASSIUM CHLORIDE CRYS ER 20 MEQ PO TBCR
30.0000 meq | EXTENDED_RELEASE_TABLET | ORAL | Status: AC
  Administered 2023-01-13 (×2): 30 meq via ORAL
  Filled 2023-01-13 (×2): qty 1

## 2023-01-13 NOTE — Telephone Encounter (Signed)
We are aware the pt is in hospital .

## 2023-01-13 NOTE — ED Notes (Signed)
ED TO INPATIENT HANDOFF REPORT  ED Nurse Name and Phone #: Pearletha Forge RN   S Name/Age/Gender [name] 52 y.o. female Room/Bed: [phone]  Code Status   Code Status: Do not attempt resuscitation (DNR) PRE-ARREST INTERVENTIONS DESIRED  Home/SNF/Other Home Patient oriented to: self, place, time, and situation Is this baseline? Yes   Triage Complete: Triage complete  Chief Complaint Acute respiratory failure with hypoxia (HCC) [J96.01]  Triage Note Patient arrives by EMS with c/o low oxygen.   Patient has physical therapy that has been coming in and have notice the past 2-3 days oxygen levels have been low.   Per EMS oxygen saturations were 78% on RA and came up to 94% when placed on 2L [place].   Patient is not on oxygen at home, just uses CPAP at night.  Patient has history of CHF.  Patient denies shortness of breath.     Allergies Allergies  Allergen Reactions   Niacin Anaphylaxis, Shortness Of Breath and Swelling    Swelling and "problems breathing"   Sulfamethoxazole-Trimethoprim Anaphylaxis, Swelling, Rash and Other (See Comments)    Throat closed and the eyes became swollen   Aspirin Hives   Bactrim Hives   Benzoin Compound Rash   Cephalexin Hives    Tolerated ceftriaxone and cefepime 07/2019   Iohexol Hives    Pt treated with PO benedryl   Lisinopril Hives   Naproxen Hives   Pnu-Imune [Pneumococcal Polysaccharide Vaccine] Rash   Sulfonamide Derivatives Rash    Level of Care/Admitting Diagnosis ED Disposition     ED Disposition  Admit   Condition  --   Comment  Hospital Area: [facility] [[id number]]  Level of Care: Telemetry Cardiac [[id number]]  May admit patient to [facility] or [facility] if equivalent level of care is available:: No  Covid Evaluation: Asymptomatic - no recent exposure (last 10 days) testing not required  Diagnosis: Acute respiratory failure with hypoxia [facility]) [[id number]]  Admitting Physician: Charlsie Quest [[id number]]  Attending  Physician: Charlsie Quest [[id number]]  Certification:: I certify this patient will need inpatient services for at least 2 midnights  Expected Medical Readiness: 01/14/2023          B Medical/Surgery History Past Medical History:  Diagnosis Date   Allergic rhinitis    Ankle fracture, right    Anxiety    ASCUS of cervix with negative high risk HPV 05/2018   Carpal tunnel syndrome    Cervical cancer (HCC) 1998   Fibromyalgia    Hyperlipidemia    Hypertension    Migraine headache    Obesity    Sleep apnea    Past Surgical History:  Procedure Laterality Date   ABDOMINAL HYSTERECTOMY  10/1996   ABDOMINAL SURGERY  jan/11/2012   gastric bypass surgery   CARPAL TUNNEL RELEASE     bilateral    CESAREAN SECTION     CHOLECYSTECTOMY  07/1992   ESOPHAGOGASTRODUODENOSCOPY N/A 01/14/2016   Procedure: ESOPHAGOGASTRODUODENOSCOPY (EGD);  Surgeon: Napoleon Form, MD;  Location: [facility] ENDOSCOPY;  Service: Endoscopy;  Laterality: N/A;     A IV Location/Drains/Wounds Patient Lines/Drains/Airways Status     Active Line/Drains/Airways     Name Placement date Placement time Site Days   Peripheral IV 01/12/23 20 G Anterior;Right Forearm 01/12/23  1618  Forearm  1   External Urinary Catheter 01/12/23  2030  --  1   Pressure Injury 11/21/22 Buttocks Right Stage 2 -  Partial thickness loss of dermis presenting as a  shallow open injury with a red, pink wound bed without slough. 11/21/22  1849  -- 52   Pressure Injury 11/21/22 Buttocks Left 11/21/22  2150  -- 52   Wound / Incision (Open or Dehisced) 11/21/22 Irritant Dermatitis (Moisture Associated Skin Damage) Breast Right;Left;Other (Comment) 11/21/22  1850  Breast  52   Wound / Incision (Open or Dehisced) 11/21/22 Irritant Dermatitis (Moisture Associated Skin Damage) Abdomen Anterior;Bilateral;Other (Comment) 11/21/22  1851  Abdomen  52            Intake/Output Last 24 hours  Intake/Output Summary (Last 24 hours) at 01/13/2023 0422 Last  data filed at 01/13/2023 0102 Gross per 24 hour  Intake 50 ml  Output 3000 ml  Net -2950 ml    Labs/Imaging Results for orders placed or performed during the hospital encounter of 01/12/23 (from the past 48 hour(s))  CBC with Differential/Platelet     Status: None   Collection Time: 01/12/23  4:11 PM  Result Value Ref Range   WBC 6.5 4.0 - 10.5 K/uL   RBC 4.55 3.87 - 5.11 MIL/uL   Hemoglobin 13.2 12.0 - 15.0 g/dL   HCT 66.4 40.3 - 47.4 %   MCV 95.2 80.0 - 100.0 fL   MCH 29.0 26.0 - 34.0 pg   MCHC 30.5 30.0 - 36.0 g/dL   RDW 25.9 56.3 - 87.5 %   Platelets 203 150 - 400 K/uL   nRBC 0.0 0.0 - 0.2 %   Neutrophils Relative % 64 %   Neutro Abs 4.2 1.7 - 7.7 K/uL   Lymphocytes Relative 20 %   Lymphs Abs 1.3 0.7 - 4.0 K/uL   Monocytes Relative 9 %   Monocytes Absolute 0.6 0.1 - 1.0 K/uL   Eosinophils Relative 6 %   Eosinophils Absolute 0.4 0.0 - 0.5 K/uL   Basophils Relative 1 %   Basophils Absolute 0.0 0.0 - 0.1 K/uL   Immature Granulocytes 0 %   Abs Immature Granulocytes 0.02 0.00 - 0.07 K/uL    Comment: Performed at [facility] Lab, [address], [place], [place] [address]  Brain natriuretic peptide     Status: None   Collection Time: 01/12/23  4:11 PM  Result Value Ref Range   B Natriuretic Peptide 14.7 0.0 - 100.0 pg/mL    Comment: Performed at [facility] Lab, [address], [place], [place] [address]  Comprehensive metabolic panel     Status: Abnormal   Collection Time: 01/12/23  4:11 PM  Result Value Ref Range   Sodium 138 135 - 145 mmol/L   Potassium 2.7 (LL) 3.5 - 5.1 mmol/L    Comment: CRITICAL RESULT CALLED TO, READ BACK BY AND VERIFIED WITH I, PULLIAM RN @1736  ON 01/12/23 BY MAB MT   Chloride 90 (L) 98 - 111 mmol/L   CO2 37 (H) 22 - 32 mmol/L   Glucose, Bld 84 70 - 99 mg/dL    Comment: Glucose reference range applies only to samples taken after fasting for at least 8 hours.   BUN 8 6 - 20 mg/dL   Creatinine, Ser 4.16 0.44 - 1.00 mg/dL   Calcium 8.2 (L)  8.9 - 10.3 mg/dL   Total Protein 6.7 6.5 - 8.1 g/dL   Albumin 3.0 (L) 3.5 - 5.0 g/dL   AST 21 15 - 41 U/L   ALT 17 0 - 44 U/L   Alkaline Phosphatase 100 38 - 126 U/L   Total Bilirubin 0.4 0.3 - 1.2 mg/dL  GFR, Estimated >60 >60 mL/min    Comment: (NOTE) Calculated using the CKD-EPI Creatinine Equation (2021)    Anion gap 11 5 - 15    Comment: Performed at [facility], [address], [place], [place] [address]  Troponin I (High Sensitivity)     Status: None   Collection Time: 01/12/23  4:11 PM  Result Value Ref Range   Troponin I (High Sensitivity) 7 <18 ng/L    Comment: (NOTE) Elevated high sensitivity troponin I (hsTnI) values and significant  changes across serial measurements may suggest ACS but many other  chronic and acute conditions are known to elevate hsTnI results.  Refer to the "Links" section for chest pain algorithms and additional  guidance. Performed at [facility] Lab, [address], [place], [place] [address]   Magnesium     Status: Abnormal   Collection Time: 01/12/23  4:11 PM  Result Value Ref Range   Magnesium 1.6 (L) 1.7 - 2.4 mg/dL    Comment: Performed at [facility], [address], [place], [place] [address]  I-Stat venous blood gas, ED     Status: Abnormal   Collection Time: 01/12/23  4:24 PM  Result Value Ref Range   pH, Ven 7.448 (H) 7.25 - 7.43   pCO2, Ven 60.3 (H) 44 - 60 mmHg   pO2, Ven 61 (H) 32 - 45 mmHg   Bicarbonate 41.8 (H) 20.0 - 28.0 mmol/L   TCO2 44 (H) 22 - 32 mmol/L   O2 Saturation 91 %   Acid-Base Excess 15.0 (H) 0.0 - 2.0 mmol/L   Sodium 139 135 - 145 mmol/L   Potassium 2.7 (LL) 3.5 - 5.1 mmol/L   Calcium, Ion 1.01 (L) 1.15 - 1.40 mmol/L   HCT 42.0 36.0 - 46.0 %   Hemoglobin 14.3 12.0 - 15.0 g/dL   Sample type VENOUS    Comment NOTIFIED PHYSICIAN   Troponin I (High Sensitivity)     Status: None   Collection Time: 01/12/23  6:20 PM  Result Value Ref Range   Troponin I (High Sensitivity) 7 <18 ng/L     Comment: (NOTE) Elevated high sensitivity troponin I (hsTnI) values and significant  changes across serial measurements may suggest ACS but many other  chronic and acute conditions are known to elevate hsTnI results.  Refer to the "Links" section for chest pain algorithms and additional  guidance. Performed at [facility] Lab, [address], [place], [place] [address]   Magnesium     Status: None   Collection Time: 01/13/23  2:27 AM  Result Value Ref Range   Magnesium 2.1 1.7 - 2.4 mg/dL    Comment: Performed at [facility] Lab, [address], [place], [place] [address]  Basic metabolic panel     Status: Abnormal   Collection Time: 01/13/23  2:27 AM  Result Value Ref Range   Sodium 138 135 - 145 mmol/L   Potassium 3.3 (L) 3.5 - 5.1 mmol/L   Chloride 90 (L) 98 - 111 mmol/L   CO2 35 (H) 22 - 32 mmol/L   Glucose, Bld 140 (H) 70 - 99 mg/dL    Comment: Glucose reference range applies only to samples taken after fasting for at least 8 hours.   BUN 6 6 - 20 mg/dL   Creatinine, Ser 6.06 0.44 - 1.00 mg/dL   Calcium 8.3 (L) 8.9 - 10.3 mg/dL   GFR, Estimated >30 >16 mL/min    Comment: (NOTE) Calculated  using the CKD-EPI Creatinine Equation (2021)    Anion gap 13 5 - 15    Comment: Performed at [facility] Lab, [address] [place], [place] [address]  CBC     Status: None   Collection Time: 01/13/23  2:27 AM  Result Value Ref Range   WBC 7.1 4.0 - 10.5 K/uL   RBC 4.84 3.87 - 5.11 MIL/uL   Hemoglobin 13.7 12.0 - 15.0 g/dL   HCT 60.4 54.0 - 98.1 %   MCV 93.0 80.0 - 100.0 fL   MCH 28.3 26.0 - 34.0 pg   MCHC 30.4 30.0 - 36.0 g/dL   RDW 19.1 47.8 - 29.5 %   Platelets 212 150 - 400 K/uL   nRBC 0.0 0.0 - 0.2 %    Comment: Performed at [facility], [address] [place], [place] [address]   *Note: Due to a large number of results and/or encounters for the requested time period, some results have not been displayed. A complete set of results can be found in Results  Review.   DG Chest Port 1 View  Result Date: 01/12/2023 CLINICAL DATA:  hypoxia. Low oxygen saturation. Sleep apnea. Obesity. EXAM: PORTABLE CHEST 1 VIEW COMPARISON:  11/22/2022. FINDINGS: Low lung volume. Mild central pulmonary vascular congestion, likely accentuated by low lung volume. No frank pulmonary edema. Elevated right hemidiaphragm, similar to the prior study. There are compressive atelectatic changes at the right lung base, also similar to the prior study. Bilateral lung fields are otherwise clear. Bilateral lateral costophrenic angles are clear. Stable cardio-mediastinal silhouette. No acute osseous abnormalities. The soft tissues are within normal limits. IMPRESSION: 1. Low lung volume. Mild central pulmonary vascular congestion. 2. Elevated right hemidiaphragm with compressive atelectatic changes at the right lung base. Electronically Signed   By: Jules Schick M.D.   On: 01/12/2023 15:54    Pending Labs Unresulted Labs (From admission, onward)    None       Vitals/Pain Today's Vitals   01/13/23 0100 01/13/23 0242 01/13/23 0315 01/13/23 0317  BP:  (!) 144/99 (!) 151/90   Pulse: 84 79 83   Resp: 18 (!) 21 19   Temp: 97.9 F (36.6 C)     TempSrc: Oral     SpO2: 92% 92% 95%   PainSc:   Asleep Asleep    Isolation Precautions No active isolations  Medications Medications  enoxaparin (LOVENOX) injection 40 mg (40 mg Subcutaneous Given 01/12/23 2023)  sodium chloride flush (NS) 0.9 % injection 3 mL (3 mLs Intravenous Given 01/12/23 2244)  acetaminophen (TYLENOL) tablet 650 mg (has no administration in time range)    Or  acetaminophen (TYLENOL) suppository 650 mg (has no administration in time range)  ondansetron (ZOFRAN) tablet 4 mg (has no administration in time range)    Or  ondansetron (ZOFRAN) injection 4 mg (has no administration in time range)  senna-docusate (Senokot-S) tablet 1 tablet (has no administration in time range)  arformoterol (BROVANA) nebulizer  solution 15 mcg (15 mcg Nebulization Given 01/12/23 2018)  budesonide (PULMICORT) nebulizer solution 0.25 mg (0.25 mg Nebulization Given 01/12/23 2018)  predniSONE (DELTASONE) tablet 40 mg (has no administration in time range)  ipratropium-albuterol (DUONEB) 0.5-2.5 (3) MG/3ML nebulizer solution 3 mL (has no administration in time range)  ALPRAZolam (XANAX) tablet 0.5 mg (0.5 mg Oral Given 01/12/23 2241)  ARIPiprazole (ABILIFY) tablet 15 mg (has no administration in time range)  Armodafinil 250 mg (250 mg Oral Not Given 01/12/23 2244)  buPROPion (  WELLBUTRIN XL) 24 hr tablet 300 mg (has no administration in time range)  clonazePAM (KLONOPIN) tablet 1 mg (1 mg Oral Given 01/12/23 2242)  cyclobenzaprine (FLEXERIL) tablet 20 mg (20 mg Oral Given 01/12/23 2241)  DULoxetine (CYMBALTA) DR capsule 120 mg (has no administration in time range)  gabapentin (NEURONTIN) capsule 600 mg (has no administration in time range)  HYDROcodone-acetaminophen (NORCO) 10-325 MG per tablet 1 tablet (has no administration in time range)  nebivolol (BYSTOLIC) tablet 10 mg (has no administration in time range)  Oxcarbazepine (TRILEPTAL) tablet 300 mg (300 mg Oral Given 01/12/23 2247)  pantoprazole (PROTONIX) EC tablet 40 mg (40 mg Oral Given 01/12/23 2242)  ipratropium-albuterol (DUONEB) 0.5-2.5 (3) MG/3ML nebulizer solution 3 mL (3 mLs Nebulization Given 01/12/23 1625)  potassium chloride SA (KLOR-CON M) CR tablet 80 mEq (80 mEq Oral Given 01/12/23 2025)  furosemide (LASIX) injection 40 mg (40 mg Intravenous Given 01/12/23 2018)  magnesium sulfate IVPB 2 g 50 mL (0 g Intravenous Stopped 01/12/23 2125)  methylPREDNISolone sodium succinate (SOLU-MEDROL) 40 mg/mL injection 40 mg (40 mg Intravenous Given 01/12/23 2014)    Mobility manual wheelchair     Focused Assessments Pulmonary Assessment Handoff:  Lung sounds: Bilateral Breath Sounds: Diminished L Breath Sounds: Expiratory wheezes R Breath Sounds: Expiratory wheezes O2  Device: Nasal Cannula O2 Flow Rate (L/min): 3 L/min    R Recommendations: See Admitting Provider Note  Report given to:   Additional Notes: Bariatric patient, pure wick in place for strict I&O's. Patient reports wears C pap at night.

## 2023-01-13 NOTE — ED Notes (Signed)
Shift report received from [facility] Assumed care of patient at this time.

## 2023-01-13 NOTE — Progress Notes (Signed)
PROGRESS NOTE    [name]  YNW:[id number] DOB: [birth year] DOA: 01/12/2023 PCP: Sheliah Hatch, MD    Brief Narrative:   [name] is a 52 y.o. female with past medical history significant for  cervical cord myelomalacia with chronic bedbound status, HFpEF (EF 60-65%), asthma, polyarteritis nodosa, plaque psoriasis, depression/anxiety, chronic pain on chronic opioid therapy, fibromyalgia, morbid obesity, chronic lower extremity edema with venous stasis, [name] on CPAP who presented to the ED for evaluation of hypoxia. Patient states that she has been feeling well.  She has been working with PT.  The last 2 days they have noted that her SpO2 has been dropping into the 80s.  Patient states that she has really not had any respiratory symptoms.  EMS were called today and she was noted to have SpO2 78% on room air.  She was placed on 2 L via [place] with improvement at 94% and brought to the ED for further evaluation.   Patient states that she has noted some wheezing today but otherwise no significant dyspnea, cough, fevers, chills, diaphoresis, chest pain, palpitations.  She has chronic edema to bilateral lower extremities with venous stasis changes and she believes that this actually looks better than her baseline.  She reports adherence to Lasix 40 mg daily and reports good urine output.  She denies nausea, vomiting, abdominal pain.   Patient last admitted 11/20/2022-11/24/2022 for acute hypoxic respiratory failure due to acute on chronic HFpEF.  She was diuresed adequately and discharged to home on room air with prescription for Lasix 40 mg daily.     In the ED, BP 135/82, pulse 78, RR 26, temp 98.8 F, SpO2 99% on 3 L O2 via [place]. Sodium 138, potassium 2.7, bicarb 37, BUN 8, creatinine 0.74, serum glucose 84, LFTs within normal limits, troponin 7, BNP 14.7, WBC 6.5, hemoglobin 13.2, platelets 203,000. Chest x-ray shows low lung volumes with mild central pulmonary vascular congestion.  Elevated right  hemidiaphragm with compressive atelectatic changes at the right lung base noted. Patient was given DuoNeb treatment, IV Lasix 40 mg, oral K 80 mEq.  The hospitalist service was consulted to admit for further evaluation and management.  Assessment & Plan:   Acute asthma exacerbation: Patient presenting with progressive shortness of breath.  Wheezing noted on admission post DuoNeb treatment.  No new cough or sputum production.  Received IV Solu-Medrol in the ED -- Prednisone 40 mg p.o. daily -- Brovana neb twice daily -- Pulmicort neb twice daily -- DuoNebs as needed for shortness of breath/wheezing -- Incentive spirometer, supplemental O2 as needed   Acute on chronic HFpEF: Some increased pulmonary vascular congestion on CT x-ray likely contributing to acute hypoxia.  TTE 11/21/2022 showed EF 60-65%.  Chronic stasis changes bilateral lower extremity noted. -- Lasix 40 mg IV every 24 hours -- Bystolic 10 mg p.o. daily -- Strict I's and O's and daily weights   Acute respiratory failure with hypoxia: New supplemental O2 requirement of 2 L via [employer] on admission secondary to above.  SpO2 as low as 78% on RA with EMS. -- Continue management as above and wean supplemental O2 as able   Hypokalemia/hypomagnesemia: Potassium 3.3, will replete.  Magnesium 2.1. --Repeat electrolytes in a.m.   Mood disorder: Continue home Xanax, Abilify, Wellbutrin, Klonopin, Cymbalta, Trileptal.   Chronic pain syndrome: Continue gabapentin, home Norco with hold parameters.   [name]: Continue CPAP nightly, patient reports adherence.   Cervical cord myelomalacia: Chronic bedbound status.  Continue Flexeril.  Patient  states she is working with PT/OT at home.  Requesting add of home health aide at home.  Morbid obesity Complicates all facets of care.   DVT prophylaxis: enoxaparin (LOVENOX) injection 40 mg Start: 01/12/23 1930    Code Status: Do not attempt resuscitation (DNR) PRE-ARREST INTERVENTIONS  DESIRED Family Communication:   Disposition Plan:  Level of care: Telemetry Cardiac Status is: Inpatient Remains inpatient appropriate because: Needs further weaning from supplemental oxygen.    Consultants:  None  Procedures:  None  Antimicrobials:  None   Subjective: Patient seen examined bedside, resting calmly.  Reports breathing slowly improving.  Currently undergoing neb treatment.  Requesting home health aide in addition to her current therapies at home.  No other specific questions or concerns at this time.  Denies headache, no dizziness, no chest pain, no palpitations, no abdominal pain, no fever/chills/night sweats, no nausea/vomiting/diarrhea, no fatigue.  No acute events overnight per nursing staff.  Objective: Vitals:   01/13/23 0700 01/13/23 0734 01/13/23 0823 01/13/23 1050  BP: 127/63 126/75  132/73  Pulse: 81  74   Resp: 20  (!) 21   Temp:  97.7 F (36.5 C)    TempSrc:  Oral    SpO2: 97%  96%     Intake/Output Summary (Last 24 hours) at 01/13/2023 1253 Last data filed at 01/13/2023 1205 Gross per 24 hour  Intake 50 ml  Output 3650 ml  Net -3600 ml   There were no vitals filed for this visit.  Examination:  Physical Exam: GEN: NAD, alert and oriented x 3, obese HEENT: NCAT, PERRL, EOMI, sclera clear, MMM PULM: CTAB w/o wheezes/crackles, normal respiratory effort, on 2 L nasal cannula CV: RRR w/o M/G/R GI: abd soft, NTND, NABS, no R/G/M MSK: + Lower extremity peripheral edema with chronic venous stasis noted NEURO: CN II-XII intact, no focal deficits, sensation to light touch intact PSYCH: normal mood/affect Integumentary: dry/intact, no rashes or wounds    Data Reviewed: I have personally reviewed following labs and imaging studies  CBC: Recent Labs  Lab 01/12/23 1611 01/12/23 1624 01/13/23 0227  WBC 6.5  --  7.1  NEUTROABS 4.2  --   --   HGB 13.2 14.3 13.7  HCT 43.3 42.0 45.0  MCV 95.2  --  93.0  PLT 203  --  212   Basic Metabolic  Panel: Recent Labs  Lab 01/12/23 1611 01/12/23 1624 01/13/23 0227  NA 138 139 138  K 2.7* 2.7* 3.3*  CL 90*  --  90*  CO2 37*  --  35*  GLUCOSE 84  --  140*  BUN 8  --  6  CREATININE 0.74  --  0.73  CALCIUM 8.2*  --  8.3*  MG 1.6*  --  2.1   GFR: CrCl cannot be calculated (Unknown ideal weight.). Liver Function Tests: Recent Labs  Lab 01/12/23 1611  AST 21  ALT 17  ALKPHOS 100  BILITOT 0.4  PROT 6.7  ALBUMIN 3.0*   No results for input(s): "LIPASE", "AMYLASE" in the last 168 hours. No results for input(s): "AMMONIA" in the last 168 hours. Coagulation Profile: No results for input(s): "INR", "PROTIME" in the last 168 hours. Cardiac Enzymes: No results for input(s): "CKTOTAL", "CKMB", "CKMBINDEX", "TROPONINI" in the last 168 hours. BNP (last 3 results) No results for input(s): "PROBNP" in the last 8760 hours. HbA1C: No results for input(s): "HGBA1C" in the last 72 hours. CBG: No results for input(s): "GLUCAP" in the last 168 hours. Lipid Profile: No results  for input(s): "CHOL", "HDL", "LDLCALC", "TRIG", "CHOLHDL", "LDLDIRECT" in the last 72 hours. Thyroid Function Tests: No results for input(s): "TSH", "T4TOTAL", "FREET4", "T3FREE", "THYROIDAB" in the last 72 hours. Anemia Panel: No results for input(s): "VITAMINB12", "FOLATE", "FERRITIN", "TIBC", "IRON", "RETICCTPCT" in the last 72 hours. Sepsis Labs: No results for input(s): "PROCALCITON", "LATICACIDVEN" in the last 168 hours.  No results found for this or any previous visit (from the past 240 hour(s)).       Radiology Studies: DG Chest Port 1 View  Result Date: 01/12/2023 CLINICAL DATA:  hypoxia. Low oxygen saturation. Sleep apnea. Obesity. EXAM: PORTABLE CHEST 1 VIEW COMPARISON:  11/22/2022. FINDINGS: Low lung volume. Mild central pulmonary vascular congestion, likely accentuated by low lung volume. No frank pulmonary edema. Elevated right hemidiaphragm, similar to the prior study. There are compressive  atelectatic changes at the right lung base, also similar to the prior study. Bilateral lung fields are otherwise clear. Bilateral lateral costophrenic angles are clear. Stable cardio-mediastinal silhouette. No acute osseous abnormalities. The soft tissues are within normal limits. IMPRESSION: 1. Low lung volume. Mild central pulmonary vascular congestion. 2. Elevated right hemidiaphragm with compressive atelectatic changes at the right lung base. Electronically Signed   By: Jules Schick M.D.   On: 01/12/2023 15:54        Scheduled Meds:  ALPRAZolam  0.5 mg Oral BID   arformoterol  15 mcg Nebulization BID   ARIPiprazole  15 mg Oral Daily   budesonide (PULMICORT) nebulizer solution  0.25 mg Nebulization BID   buPROPion  300 mg Oral Daily   clonazePAM  1 mg Oral QHS   cyclobenzaprine  20 mg Oral BID   DULoxetine  120 mg Oral Daily   enoxaparin (LOVENOX) injection  40 mg Subcutaneous Q24H   furosemide  40 mg Intravenous Daily   modafinil  300 mg Oral BID   nebivolol  10 mg Oral Daily   Oxcarbazepine  300 mg Oral BID   pantoprazole  40 mg Oral BID   potassium chloride  30 mEq Oral Q3H   predniSONE  40 mg Oral Q breakfast   sodium chloride flush  3 mL Intravenous Q12H   Continuous Infusions:   LOS: 1 day    Time spent: 53 minutes spent on chart review, discussion with nursing staff, consultants, updating family and interview/physical exam; more than 50% of that time was spent in counseling and/or coordination of care.    Alvira Philips [place], DO [facility] Available via Epic secure chat 7am-7pm After these hours, please refer to coverage provider listed on [email or web address] 01/13/2023, 12:53 PM

## 2023-01-13 NOTE — Plan of Care (Signed)
  Problem: Education: Goal: Knowledge of General Education information will improve Description: Including pain rating scale, medication(s)/side effects and non-pharmacologic comfort measures Outcome: Progressing   Problem: Clinical Measurements: Goal: Will remain free from infection Outcome: Progressing   

## 2023-01-13 NOTE — TOC Initial Note (Signed)
Transition of Care (TOC) - Initial/Assessment Note  Donn Pierini RN, BSN Transitions of Care Unit 4E- RN Case Manager See Treatment Team for direct phone #   Patient Details  Name: [name] MRN: [id number] Date of Birth: 11/11/1970  Transition of Care [facility]) CM/SW Contact:    Darrold Span, RN Phone Number: 01/13/2023, 2:21 PM  Clinical Narrative:                 Pt from home with spouse and son, active with [facility] for HHRN/PT/OT, she is not on home 02 prior to admit.  CM confirmed with [facility] services can be resumed on discharge- RN/PT/OT.  Pt is has hospital bed, and other DME that she got through [facility]- no new DME needs noted at this time. She gets her medications from [employer] thru delivery. She states she walks at home with [facility] therapy , she is learning how to walk again per patient.    Pt will need Ambulance transport home, may need home 02, on last admit did not qualify at time of discharge- CM will follow for possible 02 needs this time for transition home.   Expected Discharge Plan: Home w Home Health Services Barriers to Discharge: Continued Medical Work up   Patient Goals and CMS Choice Patient states their goals for this hospitalization and ongoing recovery are:: return home CMS Medicare.gov Compare Post Acute Care list provided to:: Patient Choice offered to / list presented to : Patient      Expected Discharge Plan and Services   Discharge Planning Services: CM Consult Post Acute Care Choice: Home Health, Resumption of Svcs/PTA Provider Living arrangements for the past 2 months: Single Family Home                   DME Agency: NA       HH Arranged: RN, Nurse's Aide, PT, OT HH Agency: [facility] Date HH Agency Contacted: 01/13/23 Time HH Agency Contacted: 1420 Representative spoke with at [facility] Agency: Morrie Sheldon  Prior Living Arrangements/Services Living arrangements for the past 2 months: Single Family  Home Lives with:: Self, Adult Children Patient language and need for interpreter reviewed:: Yes Do you feel safe going back to the place where you live?: Yes      Need for Family Participation in Patient Care: Yes (Comment) Care giver support system in place?: Yes (comment) Current home services: DME (hospital bed, walker, cane , shower chair) Criminal Activity/Legal Involvement Pertinent to Current Situation/Hospitalization: No - Comment as needed  Activities of Daily Living      Permission Sought/Granted Permission sought to share information with : Oceanographer granted to share information with : Yes, Verbal Permission Granted     Permission granted to share info w AGENCY: HH        Emotional Assessment       Orientation: : Oriented to Self, Oriented to Place, Oriented to  Time, Oriented to Situation Alcohol / Substance Use: Not Applicable Psych Involvement: No (comment)  Admission diagnosis:  Acute respiratory failure with hypoxia (HCC) [J96.01] Acute on chronic respiratory failure with hypoxia (HCC) [J96.21] Moderate persistent asthma, unspecified whether complicated [J45.40] Acute congestive heart failure, unspecified heart failure type (HCC) [I50.9] Patient Active Problem List   Diagnosis Date Noted   Asthma, chronic, unspecified asthma severity, with acute exacerbation 01/12/2023   Hypomagnesemia 01/12/2023   Bedbound 12/14/2022   Acute on chronic heart failure with preserved ejection fraction (HFpEF) (HCC) 11/23/2022  Pressure injury of skin 11/23/2022   Nocturnal hypoxemia 06/21/2022   Elevated LFTs 05/06/2022   Obesity hypoventilation syndrome (HCC) 05/06/2022   Acute respiratory failure with hypoxia (HCC) 04/22/2022   Hypokalemia 04/22/2022   Leg cramps 01/12/2022   Fever of unknown origin 08/12/2021   Sacral pressure sore 07/06/2021   Vitamin B6 deficiency 06/08/2021   Myelomalacia of cervical cord (HCC) 06/06/2021    Compressive cervical cord myelomalacia (HCC) 06/05/2021   B12 deficiency 06/05/2021   Folate deficiency 06/05/2021   Vitamin D deficiency 06/05/2021   Leg weakness, bilateral 06/04/2021   Cervical atypia 01/06/2021   Steatosis of liver 01/06/2021   Hyponatremia 07/27/2019   Urinary urgency 04/04/2018   Chronic narcotic use 09/07/2016   Acute blood loss anemia 01/14/2016   Multiple gastric ulcers    PAN (polyarteritis nodosa) (HCC) 11/24/2015   GERD (gastroesophageal reflux disease) 01/08/2014   Routine general medical examination at a health care facility 04/23/2013   Bariatric surgery status 10/31/2012   S/P skin biopsy 10/31/2012   Psoriasis 01/09/2012   DDD (degenerative disc disease), lumbar 11/30/2011   Migraine headache    [name] (obstructive sleep apnea) 11/16/2010   HYPERGLYCEMIA, FASTING 10/08/2009   CONTRACTURE OF TENDON 08/17/2009   PARESTHESIA, HANDS 12/23/2008   Leg pain, right 11/05/2008   ADVERSE DRUG REACTION 04/03/2008   PERIPHERAL EDEMA 03/26/2008   Obesity, morbid, BMI 50 or higher (HCC) 01/03/2007   Cellulitis 01/03/2007   Hyperlipidemia 09/22/2006   Fibromyalgia 09/20/2006   Anxiety and depression 06/28/2006   Essential hypertension 06/28/2006   PCP:  Sheliah Hatch, MD Pharmacy:   [facility] [place], [place] - [address] [place] [address] Phone: [phone] Fax: [phone]  [facility] [address] [place] [address] Phone: [phone] Fax: [phone]     Social Determinants of Health (SDOH) Social History: SDOH Screenings   Food Insecurity: No Food Insecurity (11/25/2022)  Housing: Low Risk  (11/02/2022)  Transportation Needs: No Transportation Needs (11/25/2022)  Utilities: Not At Risk (11/02/2022)  Alcohol Screen: Low Risk  (11/02/2022)  Depression (PHQ2-9): High Risk (01/09/2023)  Financial Resource Strain: Low Risk  (11/02/2022)  Physical Activity: Inactive  (11/02/2022)  Social Connections: Unknown (11/02/2022)  Recent Concern: Social Connections - Socially Isolated (11/02/2022)  Stress: Stress Concern Present (11/02/2022)  Tobacco Use: Low Risk  (01/12/2023)   SDOH Interventions:     Readmission Risk Interventions    04/24/2022   11:10 AM  Readmission Risk Prevention Plan  Transportation Screening Complete  HRI or Home Care Consult Complete  Social Work Consult for Recovery Care Planning/Counseling Complete  Palliative Care Screening Not Applicable  Medication Review Oceanographer) Referral to Pharmacy

## 2023-01-14 ENCOUNTER — Other Ambulatory Visit (HOSPITAL_COMMUNITY): Payer: Self-pay

## 2023-01-14 DIAGNOSIS — J9601 Acute respiratory failure with hypoxia: Secondary | ICD-10-CM | POA: Diagnosis not present

## 2023-01-14 LAB — BASIC METABOLIC PANEL
Anion gap: 13 (ref 5–15)
BUN: 8 mg/dL (ref 6–20)
CO2: 35 mmol/L — ABNORMAL HIGH (ref 22–32)
Calcium: 8.8 mg/dL — ABNORMAL LOW (ref 8.9–10.3)
Chloride: 91 mmol/L — ABNORMAL LOW (ref 98–111)
Creatinine, Ser: 0.66 mg/dL (ref 0.44–1.00)
GFR, Estimated: >60 mL/min (ref >60)
Glucose, Bld: 85 mg/dL (ref 70–99)
Potassium: 3.3 mmol/L — ABNORMAL LOW (ref 3.5–5.1)
Sodium: 139 mmol/L (ref 135–145)

## 2023-01-14 LAB — LAB REPORT - SCANNED: EGFR: 102

## 2023-01-14 LAB — MAGNESIUM: Magnesium: 2.2 mg/dL (ref 1.7–2.4)

## 2023-01-14 MED ORDER — PREDNISONE 10 MG PO TABS
40.0000 mg | ORAL_TABLET | Freq: Every day | ORAL | 0 refills | Status: AC
  Filled 2023-01-14: qty 12, 3d supply, fill #0

## 2023-01-14 MED ORDER — IPRATROPIUM-ALBUTEROL 0.5-2.5 (3) MG/3ML IN SOLN
3.0000 mL | Freq: Four times a day (QID) | RESPIRATORY_TRACT | 0 refills | Status: DC | PRN
  Filled 2023-01-14: qty 360, 30d supply, fill #0

## 2023-01-14 MED ORDER — POTASSIUM CHLORIDE CRYS ER 20 MEQ PO TBCR
40.0000 meq | EXTENDED_RELEASE_TABLET | ORAL | Status: AC
  Administered 2023-01-14 (×2): 40 meq via ORAL
  Filled 2023-01-14 (×2): qty 2

## 2023-01-14 NOTE — TOC Transition Note (Addendum)
Transition of Care [facility]) - CM/SW Discharge Note   Patient Details  Name: [name] MRN: [id number] Date of Birth: [birth year]  Transition [facility] Care [facility]) CM/SW Contact:  Ronny Bacon, RN Phone Number: 01/14/2023, 9:21 AM   Clinical Narrative:   Patient is being discharged today. Nebulizer machine ordered through [facility]. Per floor RN, patient is on room air and has CPAP machine at home. Ambulance transportation to be arranged. Heather- [facility] aware of pt discharge.  1020: PTAR arranged, floor RN aware and son made aware. Confirms will be home for patient arrival.    Final next level of care: Home w Home Health Services Barriers to Discharge: No Barriers Identified   Patient Goals and CMS Choice CMS Medicare.gov Compare Post Acute Care list provided to:: Patient Choice offered to / list presented to : Patient  Discharge Placement                         Discharge Plan and Services Additional resources added to the After Visit Summary for     Discharge Planning Services: CM Consult Post Acute Care Choice: Home Health, Resumption of Svcs/PTA Provider          DME Arranged: Nebulizer machine DME Agency: [facility] Date DME Agency Contacted: 01/14/23 Time DME Agency Contacted: [phone] Representative spoke with at DME Agency: Selena Batten HH Arranged: RN, Nurse's Aide, PT, OT [facility] Agency: [facility] Date HH Agency Contacted: 01/13/23 Time HH Agency Contacted: 1420 Representative spoke with at [facility] Agency: Morrie Sheldon  Social Determinants of Health (SDOH) Interventions SDOH Screenings   Food Insecurity: No Food Insecurity (11/25/2022)  Housing: Low Risk  (11/02/2022)  Transportation Needs: No Transportation Needs (11/25/2022)  Utilities: Not At Risk (11/02/2022)  Alcohol Screen: Low Risk  (11/02/2022)  Depression (PHQ2-9): High Risk (01/09/2023)  Financial Resource Strain: Low Risk  (11/02/2022)  Physical Activity: Inactive (11/02/2022)  Social Connections:  Unknown (11/02/2022)  Recent Concern: Social Connections - Socially Isolated (11/02/2022)  Stress: Stress Concern Present (11/02/2022)  Tobacco Use: Low Risk  (01/12/2023)     Readmission Risk Interventions    04/24/2022   11:10 AM  Readmission Risk Prevention Plan  Transportation Screening Complete  HRI or Home Care Consult Complete  Social Work Consult for Recovery Care Planning/Counseling Complete  Palliative Care Screening Not Applicable  Medication Review Oceanographer) Referral to Pharmacy

## 2023-01-14 NOTE — TOC Transition Note (Addendum)
Transition of Care [facility]) - CM/SW Discharge Note   Patient Details  Name: [name] MRN: [id number] Date of Birth: 05/01/1971  Transition of Care [facility]) CM/SW Contact:  Ronny Bacon, RN Phone Number: 01/14/2023, 11:13 AM   Clinical Narrative:   Message received from floor RN that patient now needs home oxygen. PTAR transportation canceled and Selena Batten- [facility] made aware of home O2 need. [facility] unsure if able to get oxygen delivered to home due to holiday weekend. Awaiting confirmation if service can be provided.  11:48 Confirmed that O2 can be delivered to the patient home today but unknown ETA. Company will call when in route to patient home to give ETA. Will arrange PTAR once O2 is being delivered to home. O2 tank cannot be delivered to bedside for transport home due to Regulations of PTAR transport.  1348 Confirmed with son that home oxygen was delivered. PTAR transportation arranged.     Final next level of care: Home w Home Health Services Barriers to Discharge: No Barriers Identified   Patient Goals and CMS Choice CMS Medicare.gov Compare Post Acute Care list provided to:: Patient Choice offered to / list presented to : Patient  Discharge Placement                         Discharge Plan and Services Additional resources added to the After Visit Summary for     Discharge Planning Services: CM Consult Post Acute Care Choice: Home Health, Resumption of Svcs/PTA Provider          DME Arranged: Oxygen DME Agency: [facility] Date DME Agency Contacted: 01/14/23 Time DME Agency Contacted: 1104 Representative spoke with at DME Agency: Selena Batten HH Arranged: RN, Nurse's Aide, PT, OT [facility] Agency: [facility] Date HH Agency Contacted: 01/13/23 Time HH Agency Contacted: 1420 Representative spoke with at [facility] Agency: Morrie Sheldon  Social Determinants of Health (SDOH) Interventions SDOH Screenings   Food Insecurity: No Food Insecurity (11/25/2022)  Housing: Low Risk   (11/02/2022)  Transportation Needs: No Transportation Needs (11/25/2022)  Utilities: Not At Risk (11/02/2022)  Alcohol Screen: Low Risk  (11/02/2022)  Depression (PHQ2-9): High Risk (01/09/2023)  Financial Resource Strain: Low Risk  (11/02/2022)  Physical Activity: Inactive (11/02/2022)  Social Connections: Unknown (11/02/2022)  Recent Concern: Social Connections - Socially Isolated (11/02/2022)  Stress: Stress Concern Present (11/02/2022)  Tobacco Use: Low Risk  (01/12/2023)     Readmission Risk Interventions    04/24/2022   11:10 AM  Readmission Risk Prevention Plan  Transportation Screening Complete  HRI or Home Care Consult Complete  Social Work Consult for Recovery Care Planning/Counseling Complete  Palliative Care Screening Not Applicable  Medication Review Oceanographer) Referral to Pharmacy

## 2023-01-14 NOTE — Discharge Summary (Signed)
Physician Discharge Summary  [name] VHQ:[id number] DOB: 10-Jul-1970 DOA: 01/12/2023  PCP: Sheliah Hatch, MD  Admit date: 01/12/2023 Discharge date: 01/14/2023  Admitted From:  Disposition:    Recommendations for Outpatient Follow-up:  Follow up with PCP in 1-2 weeks Continue prednisone to complete 5-day course Rx given for DuoNebs to use as needed for shortness of breath/wheezing  Home Health: Resume PT/OT/RN Equipment/Devices: Nebulizer machine  Discharge Condition: Stable CODE STATUS: DNR Diet recommendation: Heart Healthy Diet  History of present illness:  [name] is a 52 y.o. female with past medical history significant for  cervical cord myelomalacia with chronic bedbound status, HFpEF (EF 60-65%), asthma, polyarteritis nodosa, plaque psoriasis, depression/anxiety, chronic pain on chronic opioid therapy, fibromyalgia, morbid obesity, chronic lower extremity edema with venous stasis, [name] on CPAP who presented to the ED for evaluation of hypoxia. Patient states that she has been feeling well.  She has been working with PT.  The last 2 days they have noted that her SpO2 has been dropping into the 80s.  Patient states that she has really not had any respiratory symptoms.  EMS were called today and she was noted to have SpO2 78% on room air.  She was placed on 2 L via [place] with improvement at 94% and brought to the ED for further evaluation.   Patient states that she has noted some wheezing today but otherwise no significant dyspnea, cough, fevers, chills, diaphoresis, chest pain, palpitations.  She has chronic edema to bilateral lower extremities with venous stasis changes and she believes that this actually looks better than her baseline.  She reports adherence to Lasix 40 mg daily and reports good urine output.  She denies nausea, vomiting, abdominal pain.   Patient last admitted 11/20/2022-11/24/2022 for acute hypoxic respiratory failure due to acute on chronic HFpEF.  She  was diuresed adequately and discharged to home on room air with prescription for Lasix 40 mg daily.     In the ED, BP 135/82, pulse 78, RR 26, temp 98.8 F, SpO2 99% on 3 L O2 via . Sodium 138, potassium 2.7, bicarb 37, BUN 8, creatinine 0.74, serum glucose 84, LFTs within normal limits, troponin 7, BNP 14.7, WBC 6.5, hemoglobin 13.2, platelets 203,000. Chest x-ray shows low lung volumes with mild central pulmonary vascular congestion.  Elevated right hemidiaphragm with compressive atelectatic changes at the right lung base noted. Patient was given DuoNeb treatment, IV Lasix 40 mg, oral K 80 mEq.  The hospitalist service was consulted to admit for further evaluation and management.  Hospital course:  Acute asthma exacerbation: Patient presenting with progressive shortness of breath.  Wheezing noted on admission post DuoNeb treatment.  No new cough or sputum production.  Received IV Solu-Medrol in the ED. patient was started on scheduled Brovana/Pulmicort nebs and DuoNebs as needed.  Continued on prednisone with resolution of her shortness of breath and her breathing back to baseline.  Will continue prednisone to complete 5-day course on discharge.  Patient will also be issued a nebulizer machine and DuoNebs to use as needed at home.  Resume home Dulera and albuterol MDI as needed.   Acute on chronic HFpEF: Some increased pulmonary vascular congestion on CT x-ray likely contributing to acute hypoxia.  TTE 11/21/2022 showed EF 60-65%.  Chronic stasis changes bilateral lower extremity noted.  Patient was started on IV Lasix with good diuresis.  Continue Bystolic 10 mg p.o. daily, furosemide 40 mg p.o. daily.   Acute respiratory failure with hypoxia: New  supplemental O2 requirement of 2 L via [place] on admission secondary to above.  SpO2 as low as 78% on RA with EMS.  Patient was weaned off of supple oxygen.  But does require CPAP when sleeping as she does desaturate likely secondary to her body habitus.   Encouraged continued CPAP use when sleeping/napping.   Hypokalemia/hypomagnesemia: Repleted during hospitalization.   Mood disorder: Continue home Xanax, Abilify, Wellbutrin, Klonopin, Cymbalta, Trileptal.   Chronic pain syndrome: Continue gabapentin, Norco    [name]: Continue CPAP nightly, patient reports adherence.   Cervical cord myelomalacia: Chronic bedbound status.  Continue Flexeril.  Patient states she is working with PT/OT at home.     Morbid obesity Complicates all facets of care.  Discharge Diagnoses:  Principal Problem:   Acute respiratory failure with hypoxia (HCC) Active Problems:   Asthma, chronic, unspecified asthma severity, with acute exacerbation   Acute on chronic heart failure with preserved ejection fraction (HFpEF) (HCC)   [name] (obstructive sleep apnea)   Chronic narcotic use   Compressive cervical cord myelomalacia (HCC)   Hypokalemia   Hypomagnesemia    Discharge Instructions  Discharge Instructions     Call MD for:  difficulty breathing, headache or visual disturbances   Complete by: As directed    Call MD for:  extreme fatigue   Complete by: As directed    Call MD for:  persistant dizziness or light-headedness   Complete by: As directed    Call MD for:  persistant nausea and vomiting   Complete by: As directed    Call MD for:  severe uncontrolled pain   Complete by: As directed    Call MD for:  temperature >100.4   Complete by: As directed    Diet - low sodium heart healthy   Complete by: As directed    Increase activity slowly   Complete by: As directed       Allergies as of 01/14/2023       Reactions   Niacin Anaphylaxis, Shortness Of Breath, Swelling   Swelling and "problems breathing"   Sulfamethoxazole-trimethoprim Anaphylaxis, Swelling, Rash, Other (See Comments)   Throat closed and the eyes became swollen   Aspirin Hives   Bactrim Hives   Benzoin Compound Rash   Cephalexin Hives   Tolerated ceftriaxone and cefepime  07/2019   Iohexol Hives   Pt treated with PO benedryl   Lisinopril Hives   Naproxen Hives   Pnu-imune [pneumococcal Polysaccharide Vaccine] Rash   Sulfonamide Derivatives Rash        Medication List     STOP taking these medications    fluticasone-salmeterol 100-50 MCG/ACT Aepb Commonly known as: Advair Diskus       TAKE these medications    albuterol 108 (90 Base) MCG/ACT inhaler Commonly known as: VENTOLIN HFA Inhale 2 puffs into the lungs every 6 (six) hours as needed for wheezing or shortness of breath.   ALPRAZolam 0.5 MG tablet Commonly known as: XANAX Take 1 tablet (0.5 mg total) by mouth 2 (two) times daily.   ARIPiprazole 15 MG tablet Commonly known as: ABILIFY TAKE 1 TABLET BY MOUTH EVERY DAY   Armodafinil 250 MG tablet Take 1 tablet (250 mg total) by mouth in the morning and at bedtime.   Biotin 10 MG Tabs Take 1 tablet (10 mg total) by mouth in the morning.   buPROPion 300 MG 24 hr tablet Commonly known as: WELLBUTRIN XL TAKE 1 TABLET BY MOUTH EVERY DAY   calcium citrate 950 (200 Ca)  MG tablet Commonly known as: CALCITRATE - dosed in mg elemental calcium Take 1 tablet (200 mg of elemental calcium total) by mouth daily.   clonazePAM 1 MG tablet Commonly known as: KLONOPIN TAKE 1 TABLET BY MOUTH AT BEDTIME   cyanocobalamin 1000 MCG tablet Commonly known as: VITAMIN B12 TAKE 1 TABLET BY MOUTH EVERY DAY   cyclobenzaprine 10 MG tablet Commonly known as: FLEXERIL TAKE 2 TABLETS BY MOUTH 2 TIMES DAILY   diclofenac Sodium 1 % Gel Commonly known as: VOLTAREN Apply 4 g topically 4 (four) times daily.   DULoxetine 60 MG capsule Commonly known as: CYMBALTA Take 2 capsules (120 mg total) by mouth daily. What changed:  how much to take when to take this additional instructions   FeroSul 325 (65 Fe) MG tablet Generic drug: ferrous sulfate TAKE 1 TABLET BY MOUTH EVERY MORNING WITH BREAKFAST   folic acid 1 MG tablet Commonly known as:  FOLVITE TAKE 1 TABLET BY MOUTH EVERY DAY   furosemide 40 MG tablet Commonly known as: LASIX TAKE 1 TABLET BY MOUTH EVERY DAY MAY take additional TABLET IF weight INCREASE by 5lb AND call MD FOR further instructions   gabapentin 600 MG tablet Commonly known as: NEURONTIN Take 1 tablet (600 mg total) by mouth 2 (two) times daily as needed. Pt takes daily - can take up to twice daily   GNP Vitamin B-1 100 MG tablet Generic drug: thiamine TAKE 1 TABLET BY MOUTH EVERY DAY   HYDROcodone-acetaminophen 10-325 MG tablet Commonly known as: NORCO TAKE 1 TABLET BY MOUTH EVERY 8 HOURS AS NEEDED   ipratropium-albuterol 0.5-2.5 (3) MG/3ML Soln Commonly known as: DUONEB Take 3 mLs by nebulization every 6 (six) hours as needed (wheezing/shortness of breath).   mometasone-formoterol 100-5 MCG/ACT Aero Commonly known as: DULERA Inhale 2 puffs into the lungs 2 (two) times daily.   nebivolol 10 MG tablet Commonly known as: BYSTOLIC TAKE 1 TABLET BY MOUTH EVERY DAY   nystatin powder Commonly known as: MYCOSTATIN/NYSTOP APPLY 1 APPLICATION TOPICALLY 3 TIMES DAILY What changed:  how much to take how to take this when to take this reasons to take this additional instructions   Oxcarbazepine 300 MG tablet Commonly known as: TRILEPTAL Take 300 mg by mouth 2 (two) times daily.   pantoprazole 40 MG tablet Commonly known as: PROTONIX TAKE 1 TABLET BY MOUTH 2 TIMES DAILY   phentermine 37.5 MG capsule Take 1 capsule (37.5 mg total) by mouth every morning.   potassium chloride 20 MEQ packet Commonly known as: KLOR-CON Take 1 packet by mouth daily. Take with lasix.   predniSONE 10 MG tablet Commonly known as: DELTASONE Take 4 tablets (40 mg total) by mouth daily for 3 days. Start taking on: January 15, 2023   promethazine 25 MG tablet Commonly known as: PHENERGAN TAKE 1 TABLET BY MOUTH EVERY 6 HOURS AS NEEDED FOR NAUSEA AND VOMITING   pyridOXINE 100 MG tablet Commonly known as:  VITAMIN B6 TAKE 1 TABLET BY MOUTH EVERY DAY               Durable Medical Equipment  (From admission, onward)           Start     Ordered   01/14/23 0859  For home use only DME Nebulizer machine  Once       Question Answer Comment  Patient needs a nebulizer to treat with the following condition Asthma   Length of Need Lifetime      01/14/23 0858  Follow-up Information     [facility] Follow up.   Why: HH services to resume on discharge (RN/PT/OT, aide)- they will contact you to schedule Contact information: [address], [place], [place] [address]  Phone: [phone]        Sheliah Hatch, MD. Schedule an appointment as soon as possible for a visit in 1 week(s).   Specialty: Family Medicine Contact information: [address] [place] [address] [phone]                Allergies  Allergen Reactions   Niacin Anaphylaxis, Shortness Of Breath and Swelling    Swelling and "problems breathing"   Sulfamethoxazole-Trimethoprim Anaphylaxis, Swelling, Rash and Other (See Comments)    Throat closed and the eyes became swollen   Aspirin Hives   Bactrim Hives   Benzoin Compound Rash   Cephalexin Hives    Tolerated ceftriaxone and cefepime 07/2019   Iohexol Hives    Pt treated with PO benedryl   Lisinopril Hives   Naproxen Hives   Pnu-Imune [Pneumococcal Polysaccharide Vaccine] Rash   Sulfonamide Derivatives Rash    Consultations: None   Procedures/Studies: DG Chest Port 1 View  Result Date: 01/12/2023 CLINICAL DATA:  hypoxia. Low oxygen saturation. Sleep apnea. Obesity. EXAM: PORTABLE CHEST 1 VIEW COMPARISON:  11/22/2022. FINDINGS: Low lung volume. Mild central pulmonary vascular congestion, likely accentuated by low lung volume. No frank pulmonary edema. Elevated right hemidiaphragm, similar to the prior study. There are compressive atelectatic changes at the right lung base, also similar to the prior study.  Bilateral lung fields are otherwise clear. Bilateral lateral costophrenic angles are clear. Stable cardio-mediastinal silhouette. No acute osseous abnormalities. The soft tissues are within normal limits. IMPRESSION: 1. Low lung volume. Mild central pulmonary vascular congestion. 2. Elevated right hemidiaphragm with compressive atelectatic changes at the right lung base. Electronically Signed   By: Jules Schick M.D.   On: 01/12/2023 15:54     Subjective: Patient seen examined at bedside, resting calmly.  Lying in bed.  Weaned off of supple oxygen at rest.  States breathing is back to her baseline and ready for discharge home.  Requesting home nebulizer machine.  No other specific questions or concerns at this time.  Denies headache, no dizziness, no chest pain, no palpitations, no shortness of breath, no abdominal pain, no fever/chills/night sweats, no nausea/vomiting/diarrhea, no paresthesias.  No acute events overnight per nursing staff.  Discharge Exam: Vitals:   01/14/23 0305 01/14/23 0830  BP: 125/62 119/76  Pulse: 63 64  Resp: 20 20  Temp:  98.4 F (36.9 C)  SpO2: 99% 94%   Vitals:   01/13/23 2315 01/13/23 2340 01/14/23 0305 01/14/23 0830  BP: 104/63  125/62 119/76  Pulse:  81 63 64  Resp: 19 19 20 20   Temp: 98.3 F (36.8 C)   98.4 F (36.9 C)  TempSrc: Oral  Oral Oral  SpO2:  97% 99% 94%    Physical Exam: GEN: NAD, alert and oriented x 3, morbidly obese HEENT: NCAT, PERRL, EOMI, sclera clear, MMM PULM: CTAB w/o wheezes/crackles, normal respiratory effort, on room air CV: RRR w/o M/G/R GI: abd soft, NTND, NABS, no R/G/M MSK: Noted lower extremity peripheral edema with chronic venous stasis NEURO: Sensation to light touch intact PSYCH: normal mood/affect Integumentary: dry/intact, no rashes or wounds    The results of significant diagnostics from this hospitalization (including imaging, microbiology, ancillary and laboratory) are listed below for reference.  Microbiology: No results found for this or any previous visit (from the past 240 hour(s)).   Labs: BNP (last 3 results) Recent Labs    04/21/22 1846 11/20/22 1902 01/12/23 1611  BNP 127.6* 34.5 14.7   Basic Metabolic Panel: Recent Labs  Lab 01/12/23 1611 01/12/23 1624 01/13/23 0227 01/14/23 0358  NA 138 139 138 139  K 2.7* 2.7* 3.3* 3.3*  CL 90*  --  90* 91*  CO2 37*  --  35* 35*  GLUCOSE 84  --  140* 85  BUN 8  --  6 8  CREATININE 0.74  --  0.73 0.66  CALCIUM 8.2*  --  8.3* 8.8*  MG 1.6*  --  2.1 2.2   Liver Function Tests: Recent Labs  Lab 01/12/23 1611  AST 21  ALT 17  ALKPHOS 100  BILITOT 0.4  PROT 6.7  ALBUMIN 3.0*   No results for input(s): "LIPASE", "AMYLASE" in the last 168 hours. No results for input(s): "AMMONIA" in the last 168 hours. CBC: Recent Labs  Lab 01/12/23 1611 01/12/23 1624 01/13/23 0227  WBC 6.5  --  7.1  NEUTROABS 4.2  --   --   HGB 13.2 14.3 13.7  HCT 43.3 42.0 45.0  MCV 95.2  --  93.0  PLT 203  --  212   Cardiac Enzymes: No results for input(s): "CKTOTAL", "CKMB", "CKMBINDEX", "TROPONINI" in the last 168 hours. BNP: Invalid input(s): "POCBNP" CBG: No results for input(s): "GLUCAP" in the last 168 hours. D-Dimer No results for input(s): "DDIMER" in the last 72 hours. Hgb A1c No results for input(s): "HGBA1C" in the last 72 hours. Lipid Profile No results for input(s): "CHOL", "HDL", "LDLCALC", "TRIG", "CHOLHDL", "LDLDIRECT" in the last 72 hours. Thyroid function studies No results for input(s): "TSH", "T4TOTAL", "T3FREE", "THYROIDAB" in the last 72 hours.  Invalid input(s): "FREET3" Anemia work up No results for input(s): "VITAMINB12", "FOLATE", "FERRITIN", "TIBC", "IRON", "RETICCTPCT" in the last 72 hours. Urinalysis    Component Value Date/Time   COLORURINE AMBER (A) 04/22/2022 0544   APPEARANCEUR CLEAR 04/22/2022 0544   LABSPEC 1.031 (H) 04/22/2022 0544   PHURINE 5.0 04/22/2022 0544   GLUCOSEU NEGATIVE  04/22/2022 0544   HGBUR NEGATIVE 04/22/2022 0544   HGBUR negative 01/05/2009 1414   BILIRUBINUR NEGATIVE 04/22/2022 0544   BILIRUBINUR negative 10/05/2020 1139   KETONESUR NEGATIVE 04/22/2022 0544   PROTEINUR NEGATIVE 04/22/2022 0544   UROBILINOGEN 0.2 10/05/2020 1139   UROBILINOGEN 0.2 01/05/2009 1414   NITRITE NEGATIVE 04/22/2022 0544   LEUKOCYTESUR NEGATIVE 04/22/2022 0544   Sepsis Labs Recent Labs  Lab 01/12/23 1611 01/13/23 0227  WBC 6.5 7.1   Microbiology No results found for this or any previous visit (from the past 240 hour(s)).   Time coordinating discharge: Over 30 minutes  SIGNED:   Alvira Philips [place], DO  [facility] 01/14/2023, 9:59 AM

## 2023-01-14 NOTE — Progress Notes (Signed)
Patient desaturate to 84% in RA,Placed in 1l [place] ,later changed to CPAP as pt uses CPAP at home but her oxygen drop to 87% in CPAP,MD notified

## 2023-01-17 ENCOUNTER — Telehealth: Payer: Self-pay

## 2023-01-17 ENCOUNTER — Encounter: Payer: Self-pay | Admitting: Family Medicine

## 2023-01-17 DIAGNOSIS — N390 Urinary tract infection, site not specified: Secondary | ICD-10-CM | POA: Diagnosis not present

## 2023-01-17 DIAGNOSIS — M4802 Spinal stenosis, cervical region: Secondary | ICD-10-CM | POA: Diagnosis not present

## 2023-01-17 DIAGNOSIS — G4733 Obstructive sleep apnea (adult) (pediatric): Secondary | ICD-10-CM | POA: Diagnosis not present

## 2023-01-17 DIAGNOSIS — E559 Vitamin D deficiency, unspecified: Secondary | ICD-10-CM | POA: Diagnosis not present

## 2023-01-17 DIAGNOSIS — E785 Hyperlipidemia, unspecified: Secondary | ICD-10-CM | POA: Diagnosis not present

## 2023-01-17 DIAGNOSIS — G43909 Migraine, unspecified, not intractable, without status migrainosus: Secondary | ICD-10-CM | POA: Diagnosis not present

## 2023-01-17 DIAGNOSIS — J45909 Unspecified asthma, uncomplicated: Secondary | ICD-10-CM | POA: Diagnosis not present

## 2023-01-17 DIAGNOSIS — M300 Polyarteritis nodosa: Secondary | ICD-10-CM | POA: Diagnosis not present

## 2023-01-17 DIAGNOSIS — I110 Hypertensive heart disease with heart failure: Secondary | ICD-10-CM | POA: Diagnosis not present

## 2023-01-17 DIAGNOSIS — L409 Psoriasis, unspecified: Secondary | ICD-10-CM | POA: Diagnosis not present

## 2023-01-17 DIAGNOSIS — G629 Polyneuropathy, unspecified: Secondary | ICD-10-CM | POA: Diagnosis not present

## 2023-01-17 DIAGNOSIS — E538 Deficiency of other specified B group vitamins: Secondary | ICD-10-CM | POA: Diagnosis not present

## 2023-01-17 DIAGNOSIS — M5136 Other intervertebral disc degeneration, lumbar region: Secondary | ICD-10-CM | POA: Diagnosis not present

## 2023-01-17 DIAGNOSIS — G894 Chronic pain syndrome: Secondary | ICD-10-CM | POA: Diagnosis not present

## 2023-01-17 DIAGNOSIS — G9529 Other cord compression: Secondary | ICD-10-CM | POA: Diagnosis not present

## 2023-01-17 DIAGNOSIS — L03116 Cellulitis of left lower limb: Secondary | ICD-10-CM | POA: Diagnosis not present

## 2023-01-17 DIAGNOSIS — M797 Fibromyalgia: Secondary | ICD-10-CM | POA: Diagnosis not present

## 2023-01-17 DIAGNOSIS — K760 Fatty (change of) liver, not elsewhere classified: Secondary | ICD-10-CM | POA: Diagnosis not present

## 2023-01-17 DIAGNOSIS — I872 Venous insufficiency (chronic) (peripheral): Secondary | ICD-10-CM | POA: Diagnosis not present

## 2023-01-17 DIAGNOSIS — J9601 Acute respiratory failure with hypoxia: Secondary | ICD-10-CM | POA: Diagnosis not present

## 2023-01-17 DIAGNOSIS — K219 Gastro-esophageal reflux disease without esophagitis: Secondary | ICD-10-CM | POA: Diagnosis not present

## 2023-01-17 DIAGNOSIS — I5033 Acute on chronic diastolic (congestive) heart failure: Secondary | ICD-10-CM | POA: Diagnosis not present

## 2023-01-17 NOTE — Transitions of Care (Post Inpatient/ED Visit) (Signed)
01/17/2023  Name: [name] MRN: [id number] DOB: [birth year]  Today's TOC FU Call Status: Today's TOC FU Call Status:: Successful TOC FU Call Completed TOC FU Call Complete Date: 01/17/23 Patient's Name and Date of Birth confirmed.  Transition Care Management Follow-up Telephone Call Date of Discharge: 01/14/23 Discharge Facility: [facility]) Type of Discharge: Inpatient Admission Primary Inpatient Discharge Diagnosis:: 'acute on chronic resp failure with hypoxia" How have you been since you were released from the hospital?: Better (Pt voices she is 'doing good-having no issues with breathing-wearing oxygen."Appetite good. LBM today. HHRN came today to see her.) Any questions or concerns?: No  Items Reviewed: Did you receive and understand the discharge instructions provided?: Yes Medications obtained,verified, and reconciled?: Yes (Medications Reviewed) Any new allergies since your discharge?: No Dietary orders reviewed?: Yes Type of Diet Ordered:: low salt/heart healthy Do you have support at home?: Yes People in Home: child(ren), adult Name of Support/Comfort Primary Source: son-Chris-wants son to become paid cargiver through [facility] program if she gets approved  Medications Reviewed Today: Medications Reviewed Today     Reviewed by Charlyn Minerva, RN (Registered Nurse) on 01/17/23 at 1427  Med List Status: <None>   Medication Order Taking? Sig Documenting Provider Last Dose Status Informant  albuterol (VENTOLIN HFA) 108 (90 Base) MCG/ACT inhaler [id number] Yes Inhale 2 puffs into the lungs every 6 (six) hours as needed for wheezing or shortness of breath. Sheliah Hatch, MD Taking Active Self  ALPRAZolam Prudy Feeler) 0.5 MG tablet [id number] Yes Take 1 tablet (0.5 mg total) by mouth 2 (two) times daily. Sheliah Hatch, MD Taking Active Self  ARIPiprazole (ABILIFY) 15 MG tablet [id number] Yes TAKE 1 TABLET BY MOUTH EVERY DAY Sheliah Hatch, MD Taking  Active Self  Armodafinil 250 MG tablet [id number] Yes Take 1 tablet (250 mg total) by mouth in the morning and at bedtime. Sheliah Hatch, MD Taking Active Self  Biotin 10 MG TABS [id number] Yes Take 1 tablet (10 mg total) by mouth in the morning. Sheliah Hatch, MD Taking Active Self  buPROPion (WELLBUTRIN XL) 300 MG 24 hr tablet [id number] Yes TAKE 1 TABLET BY MOUTH EVERY DAY Sheliah Hatch, MD Taking Active Self  calcium citrate (CALCITRATE - DOSED IN MG ELEMENTAL CALCIUM) 950 (200 Ca) MG tablet [id number] Yes Take 1 tablet (200 mg of elemental calcium total) by mouth daily. Sheliah Hatch, MD Taking Active Self  clonazePAM (KLONOPIN) 1 MG tablet [id number] Yes TAKE 1 TABLET BY MOUTH AT BEDTIME Sheliah Hatch, MD Taking Active Self  cyanocobalamin (VITAMIN B12) 1000 MCG tablet [id number] Yes TAKE 1 TABLET BY MOUTH EVERY DAY Sheliah Hatch, MD Taking Active Self  cyclobenzaprine (FLEXERIL) 10 MG tablet [id number] Yes TAKE 2 TABLETS BY MOUTH 2 TIMES DAILY Sheliah Hatch, MD Taking Active Self  diclofenac Sodium (VOLTAREN) 1 % GEL [id number] Yes Apply 4 g topically 4 (four) times daily. Sheliah Hatch, MD Taking Active Self  DULoxetine (CYMBALTA) 60 MG capsule [id number] Yes Take 2 capsules (120 mg total) by mouth daily.  Patient taking differently: Take 60 mg by mouth 2 (two) times daily. Pt reported taking two capsules(120mg ) once daily   Sheliah Hatch, MD Taking Active Self  FEROSUL 325 (65 Fe) MG tablet [id number] Yes TAKE 1 TABLET BY MOUTH EVERY MORNING WITH BREAKFAST Sheliah Hatch, MD Taking Active Self  folic acid (FOLVITE) 1 MG tablet [id number] Yes TAKE 1 TABLET BY MOUTH EVERY DAY Sheliah Hatch, MD  Taking Active Self  furosemide (LASIX) 40 MG tablet [id number] Yes TAKE 1 TABLET BY MOUTH EVERY DAY MAY take additional TABLET IF weight INCREASE by 5lb AND call MD FOR further instructions Sheliah Hatch, MD Taking Active Self  gabapentin  (NEURONTIN) 600 MG tablet [id number] Yes Take 1 tablet (600 mg total) by mouth 2 (two) times daily as needed. Pt takes daily - can take up to twice daily Noralee Stain, DO Taking Active Self  GNP VITAMIN B-1 100 MG tablet [id number] Yes TAKE 1 TABLET BY MOUTH EVERY DAY Sheliah Hatch, MD Taking Active Self  HYDROcodone-acetaminophen [facility]) 10-325 MG tablet [id number] Yes TAKE 1 TABLET BY MOUTH EVERY 8 HOURS AS NEEDED Sheliah Hatch, MD Taking Active Self  ipratropium-albuterol (DUONEB) 0.5-2.5 (3) MG/3ML SOLN [id number] Yes Take 3 mLs by nebulization every 6 (six) hours as needed (wheezing/shortness of breath). [place], Alvira Philips, DO Taking Active   mometasone-formoterol [facility]) 100-5 MCG/ACT Sandrea Matte [id number] Yes Inhale 2 puffs into the lungs 2 (two) times daily. [provider] Taking Active Self  nebivolol (BYSTOLIC) 10 MG tablet [id number] Yes TAKE 1 TABLET BY MOUTH EVERY DAY Sheliah Hatch, MD Taking Active Self  nystatin (MYCOSTATIN/NYSTOP) powder [id number] Yes APPLY 1 APPLICATION TOPICALLY 3 TIMES DAILY  Patient taking differently: Apply 1 Application topically 3 (three) times daily as needed (yeast infection).   Sheliah Hatch, MD Taking Active Self  Oxcarbazepine (TRILEPTAL) 300 MG tablet [id number] Yes Take 300 mg by mouth 2 (two) times daily. [provider] Taking Active Self  pantoprazole (PROTONIX) 40 MG tablet [id number] Yes TAKE 1 TABLET BY MOUTH 2 TIMES DAILY Sheliah Hatch, MD Taking Active Self  phentermine 37.5 MG capsule [id number] Yes Take 1 capsule (37.5 mg total) by mouth every morning. Sheliah Hatch, MD Taking Active Self           Med Note Kandis Cocking Royston Sinner Jan 12, 2023  7:35 PM) Patient states that she last took medication today in the morning. Last dispense report for a 30 day supply was 10/05/2022.  potassium chloride (KLOR-CON) 20 MEQ packet [id number] Yes Take 1 packet by mouth daily. Take with lasix. Noralee Stain, DO  Taking Active Self  predniSONE (DELTASONE) 10 MG tablet [id number] Yes Take 4 tablets (40 mg total) by mouth daily for 3 days. [place], Alvira Philips, DO Taking Active   promethazine (PHENERGAN) 25 MG tablet [id number] Yes TAKE 1 TABLET BY MOUTH EVERY 6 HOURS AS NEEDED FOR NAUSEA AND VOMITING Sheliah Hatch, MD Taking Active Self  pyridOXINE (VITAMIN B6) 100 MG tablet [id number] Yes TAKE 1 TABLET BY MOUTH EVERY DAY Sheliah Hatch, MD Taking Active Self  Med List Note Lenor Derrick, CPhT 06/04/21 1816): Son Cristal Deer [phone]            Home Care and Equipment/Supplies: Were Home Health Services Ordered?: Yes Name of Home Health Agency:: [facility] Has Agency set up a time to come to your home?: Yes First Home Health Visit Date: 01/17/23 (pt voices that RN came out to see her earlier today) Any new equipment or medical supplies ordered?: Yes Name of Medical supply agency?: [employer] Were you able to get the equipment/medical supplies?: Yes Do you have any questions related to the use of the equipment/supplies?: No  Functional Questionnaire: Do you need assistance with bathing/showering or dressing?: Yes (pt states son assists her with ADLs/IADLs) Do you need assistance with meal preparation?: Yes Do  you need assistance with eating?: No Do you have difficulty maintaining continence: No Do you need assistance with getting out of bed/getting out of a chair/moving?: No Do you have difficulty managing or taking your medications?: No  Follow up appointments reviewed: PCP Follow-up appointment confirmed?: Yes Date of PCP follow-up appointment?: 01/20/23 Follow-up Provider: Dr. Beverely Low Specialist [facility] Follow-up appointment confirmed?: Yes Date of Specialist follow-up appointment?: 01/27/23 Follow-Up Specialty Provider:: Dr. Everardo All Do you need transportation to your follow-up appointment?: No (pt voices she uses [facility]) Do you understand care options if your  condition(s) worsen?: Yes-patient verbalized understanding  SDOH Interventions Today    Flowsheet Row Most Recent Value  SDOH Interventions   Food Insecurity Interventions Intervention Not Indicated  Transportation Interventions Intervention Not Indicated  [pt confirms she uses [facility]]      TOC Interventions Today    Flowsheet Row Most Recent Value  TOC Interventions   TOC Interventions Discussed/Reviewed TOC Interventions Discussed, Arranged PCP follow up within 7 days/Care Guide scheduled, S/S of infection      Interventions Today    Flowsheet Row Most Recent Value  Chronic Disease   Chronic disease during today's visit Chronic Obstructive Pulmonary Disease (COPD)  General Interventions   General Interventions Discussed/Reviewed General Interventions Discussed, Doctor Visits, Durable Medical Equipment (DME)  Doctor Visits Discussed/Reviewed Doctor Visits Discussed, PCP, Specialist  Durable Medical Equipment (DME) Oxygen, Other  [pt confims she got neb machine and oxygen delivered-she is wearing oxygen at 2L/min via  cont., has pulse ox and voices sats have ben in "the high 90s"]  PCP/Specialist Visits Compliance with follow-up visit  Education Interventions   Education Provided Provided Education  Provided Verbal Education On Nutrition, When to see the doctor, Medication, Other, Community Resources  [Pt states she applied for Medicaid and should hear back this week-she is interested in getting CAPS or PCS servcies in the home to help her-discussed with pt the next steps if Medicaid approved to try to get services in place]  Nutrition Interventions   Nutrition Discussed/Reviewed Nutrition Discussed  Pharmacy Interventions   Pharmacy Dicussed/Reviewed Pharmacy Topics Discussed, Medications and their functions  Safety Interventions   Safety Discussed/Reviewed Safety Discussed, Home Safety       Alessandra Grout [facility] Care Management Care Management  Community Coordinator Direct Phone: [phone] Toll Free: [phone] Fax: [phone]

## 2023-01-19 ENCOUNTER — Telehealth: Payer: Self-pay | Admitting: Family Medicine

## 2023-01-19 NOTE — Telephone Encounter (Signed)
Received forms from [facility]  Printed & placed in provider bin

## 2023-01-19 NOTE — Telephone Encounter (Signed)
Forms have been placed in Dr Beverely Low to be signed folder

## 2023-01-20 ENCOUNTER — Encounter: Payer: Self-pay | Admitting: Family Medicine

## 2023-01-20 ENCOUNTER — Telehealth: Payer: 59 | Admitting: Family Medicine

## 2023-01-20 VITALS — Ht 61.0 in | Wt 375.0 lb

## 2023-01-20 DIAGNOSIS — J9601 Acute respiratory failure with hypoxia: Secondary | ICD-10-CM

## 2023-01-20 NOTE — Progress Notes (Signed)
Virtual Visit via Video   I connected with patient on 01/20/23 at 11:00 AM EDT by a video enabled telemedicine application and verified that I am speaking with the correct person using two identifiers.  Location patient: Home Location provider: [place], Office Persons participating in the virtual visit: Patient, Provider, CMA Sheryle Hail C)  I discussed the limitations of evaluation and management by telemedicine and the availability of in person appointments. The patient expressed understanding and agreed to proceed.  Subjective:   HPI:   Hospital f/u- pt was admitted 8/29-31 w/ SOB and decreased O2 sats.  Home O2 was 78% while working w/ PT.  EMS was called.  She had wheezing on admission but no new cough or sputum production.  Got IV Solumedrol and nebs w/ improvement in wheezing and O2 sats.    She was weaned off O2 but encouraged to use CPAP at night and while napping.  Today is using home O2- 2 L via [place].  Doesn't use O2 when out of bed.  Reports sats are stable even without using oxygen.  No wheezing.  No cough.  Pt feels that she has everything she needs.  ROS:   See pertinent positives and negatives per HPI.  Patient Active Problem List   Diagnosis Date Noted   Asthma, chronic, unspecified asthma severity, with acute exacerbation 01/12/2023   Hypomagnesemia 01/12/2023   Bedbound 12/14/2022   Acute on chronic heart failure with preserved ejection fraction (HFpEF) (HCC) 11/23/2022   Pressure injury of skin 11/23/2022   Nocturnal hypoxemia 06/21/2022   Elevated LFTs 05/06/2022   Obesity hypoventilation syndrome (HCC) 05/06/2022   Acute respiratory failure with hypoxia (HCC) 04/22/2022   Hypokalemia 04/22/2022   Leg cramps 01/12/2022   Fever of unknown origin 08/12/2021   Sacral pressure sore 07/06/2021   Vitamin B6 deficiency 06/08/2021   Myelomalacia of cervical cord (HCC) 06/06/2021   Compressive cervical cord myelomalacia (HCC) 06/05/2021   B12 deficiency  06/05/2021   Folate deficiency 06/05/2021   Vitamin D deficiency 06/05/2021   Leg weakness, bilateral 06/04/2021   Cervical atypia 01/06/2021   Steatosis of liver 01/06/2021   Hyponatremia 07/27/2019   Urinary urgency 04/04/2018   Chronic narcotic use 09/07/2016   Acute blood loss anemia 01/14/2016   Multiple gastric ulcers    PAN (polyarteritis nodosa) (HCC) 11/24/2015   GERD (gastroesophageal reflux disease) 01/08/2014   Routine general medical examination at a health care facility 04/23/2013   Bariatric surgery status 10/31/2012   S/P skin biopsy 10/31/2012   Psoriasis 01/09/2012   DDD (degenerative disc disease), lumbar 11/30/2011   Migraine headache    [name] (obstructive sleep apnea) 11/16/2010   HYPERGLYCEMIA, FASTING 10/08/2009   CONTRACTURE OF TENDON 08/17/2009   PARESTHESIA, HANDS 12/23/2008   Leg pain, right 11/05/2008   ADVERSE DRUG REACTION 04/03/2008   PERIPHERAL EDEMA 03/26/2008   Obesity, morbid, BMI 50 or higher (HCC) 01/03/2007   Cellulitis 01/03/2007   Hyperlipidemia 09/22/2006   Fibromyalgia 09/20/2006   Anxiety and depression 06/28/2006   Essential hypertension 06/28/2006    Social History   Tobacco Use   Smoking status: Never   Smokeless tobacco: Never  Substance Use Topics   Alcohol use: No    Current Outpatient Medications:    ALPRAZolam (XANAX) 0.5 MG tablet, Take 1 tablet (0.5 mg total) by mouth 2 (two) times daily., Disp: 60 tablet, Rfl: 3   ARIPiprazole (ABILIFY) 15 MG tablet, TAKE 1 TABLET BY MOUTH EVERY DAY, Disp: 90 tablet, Rfl: 1  Armodafinil 250 MG tablet, Take 1 tablet (250 mg total) by mouth in the morning and at bedtime., Disp: 60 tablet, Rfl: 3   Biotin 10 MG TABS, Take 1 tablet (10 mg total) by mouth in the morning., Disp: 90 tablet, Rfl: 1   buPROPion (WELLBUTRIN XL) 300 MG 24 hr tablet, TAKE 1 TABLET BY MOUTH EVERY DAY, Disp: 30 tablet, Rfl: 2   calcium citrate (CALCITRATE - DOSED IN MG ELEMENTAL CALCIUM) 950 (200 Ca) MG tablet,  Take 1 tablet (200 mg of elemental calcium total) by mouth daily., Disp: 30 tablet, Rfl: 3   clonazePAM (KLONOPIN) 1 MG tablet, TAKE 1 TABLET BY MOUTH AT BEDTIME, Disp: 30 tablet, Rfl: 3   cyanocobalamin (VITAMIN B12) 1000 MCG tablet, TAKE 1 TABLET BY MOUTH EVERY DAY, Disp: 30 tablet, Rfl: 2   cyclobenzaprine (FLEXERIL) 10 MG tablet, TAKE 2 TABLETS BY MOUTH 2 TIMES DAILY, Disp: 120 tablet, Rfl: 2   diclofenac Sodium (VOLTAREN) 1 % GEL, Apply 4 g topically 4 (four) times daily., Disp: 1 g, Rfl: 1   DULoxetine (CYMBALTA) 60 MG capsule, Take 2 capsules (120 mg total) by mouth daily. (Patient taking differently: Take 60 mg by mouth 2 (two) times daily. Pt reported taking two capsules(120mg ) once daily), Disp: 240 capsule, Rfl: 2   FEROSUL 325 (65 Fe) MG tablet, TAKE 1 TABLET BY MOUTH EVERY MORNING WITH BREAKFAST, Disp: 30 tablet, Rfl: 2   folic acid (FOLVITE) 1 MG tablet, TAKE 1 TABLET BY MOUTH EVERY DAY, Disp: 30 tablet, Rfl: 2   furosemide (LASIX) 40 MG tablet, TAKE 1 TABLET BY MOUTH EVERY DAY MAY take additional TABLET IF weight INCREASE by 5lb AND call MD FOR further instructions, Disp: 30 tablet, Rfl: 1   gabapentin (NEURONTIN) 600 MG tablet, Take 1 tablet (600 mg total) by mouth 2 (two) times daily as needed. Pt takes daily - can take up to twice daily, Disp: 60 tablet, Rfl: 1   GNP VITAMIN B-1 100 MG tablet, TAKE 1 TABLET BY MOUTH EVERY DAY, Disp: 30 tablet, Rfl: 2   HYDROcodone-acetaminophen (NORCO) 10-325 MG tablet, TAKE 1 TABLET BY MOUTH EVERY 8 HOURS AS NEEDED, Disp: 90 tablet, Rfl: 0   ipratropium-albuterol (DUONEB) 0.5-2.5 (3) MG/3ML SOLN, Take 3 mLs by nebulization every 6 (six) hours as needed (wheezing/shortness of breath)., Disp: 360 mL, Rfl: 0   mometasone-formoterol (DULERA) 100-5 MCG/ACT AERO, Inhale 2 puffs into the lungs 2 (two) times daily., Disp: , Rfl:    nebivolol (BYSTOLIC) 10 MG tablet, TAKE 1 TABLET BY MOUTH EVERY DAY, Disp: 30 tablet, Rfl: 2   nystatin (MYCOSTATIN/NYSTOP)  powder, APPLY 1 APPLICATION TOPICALLY 3 TIMES DAILY (Patient taking differently: Apply 1 Application topically 3 (three) times daily as needed (yeast infection).), Disp: 60 g, Rfl: 3   Oxcarbazepine (TRILEPTAL) 300 MG tablet, Take 300 mg by mouth 2 (two) times daily., Disp: , Rfl:    pantoprazole (PROTONIX) 40 MG tablet, TAKE 1 TABLET BY MOUTH 2 TIMES DAILY, Disp: 60 tablet, Rfl: 2   phentermine 37.5 MG capsule, Take 1 capsule (37.5 mg total) by mouth every morning., Disp: 90 capsule, Rfl: 0   potassium chloride (KLOR-CON) 20 MEQ packet, Take 1 packet by mouth daily. Take with lasix., Disp: 30 packet, Rfl: 1   promethazine (PHENERGAN) 25 MG tablet, TAKE 1 TABLET BY MOUTH EVERY 6 HOURS AS NEEDED FOR NAUSEA AND VOMITING, Disp: 45 tablet, Rfl: 3   pyridOXINE (VITAMIN B6) 100 MG tablet, TAKE 1 TABLET BY MOUTH EVERY DAY, Disp:  30 tablet, Rfl: 2  Allergies  Allergen Reactions   Niacin Anaphylaxis, Shortness Of Breath and Swelling    Swelling and "problems breathing"   Sulfamethoxazole-Trimethoprim Anaphylaxis, Swelling, Rash and Other (See Comments)    Throat closed and the eyes became swollen   Aspirin Hives   Bactrim Hives   Benzoin Compound Rash   Cephalexin Hives    Tolerated ceftriaxone and cefepime 07/2019   Iohexol Hives    Pt treated with PO benedryl   Lisinopril Hives   Naproxen Hives   Pnu-Imune [Pneumococcal Polysaccharide Vaccine] Rash   Sulfonamide Derivatives Rash    Objective:   Ht 5\' 1"  (1.549 m)   Wt (!) 375 lb (170.1 kg)   BMI 70.86 kg/m  AAOx3, NAD NCAT, EOMI No obvious CN deficits Coloring WNL Pt is able to speak clearly, coherently without shortness of breath or increased work of breathing. O2 in place via [place] Thought process is linear.  Mood is appropriate.   Assessment and Plan:   Acute Respiratory Failure- resolved.  Based on hospital reports, it seems they are calling this a mix between an asthma exacerbation and HFpEF.  She responded to nebs and Lasix.   Suspect there is also a component of obesity hypoventilation syndrome.  She is wearing O2 while at home in bed but takes it off when she is up.  States she is feeling well.  Denies cough or SOB.  Unfortunately this has become a recurrent issue for pt.  Likely worsened by her chronic use of narcotics which likely depresses respiratory drive.  This is a complicated psycho social issue as well as a medical one.  She likely needs higher level of care than being home alone.  At minimum needs an adjustable hospital bed to help w/ positioning and maintenance of airway.   Neena Rhymes, MD 01/20/2023

## 2023-01-22 DIAGNOSIS — G4733 Obstructive sleep apnea (adult) (pediatric): Secondary | ICD-10-CM | POA: Diagnosis not present

## 2023-01-22 DIAGNOSIS — G894 Chronic pain syndrome: Secondary | ICD-10-CM | POA: Diagnosis not present

## 2023-01-22 DIAGNOSIS — E785 Hyperlipidemia, unspecified: Secondary | ICD-10-CM | POA: Diagnosis not present

## 2023-01-22 DIAGNOSIS — J45909 Unspecified asthma, uncomplicated: Secondary | ICD-10-CM | POA: Diagnosis not present

## 2023-01-22 DIAGNOSIS — I872 Venous insufficiency (chronic) (peripheral): Secondary | ICD-10-CM | POA: Diagnosis not present

## 2023-01-22 DIAGNOSIS — M797 Fibromyalgia: Secondary | ICD-10-CM | POA: Diagnosis not present

## 2023-01-22 DIAGNOSIS — M300 Polyarteritis nodosa: Secondary | ICD-10-CM | POA: Diagnosis not present

## 2023-01-22 DIAGNOSIS — G629 Polyneuropathy, unspecified: Secondary | ICD-10-CM | POA: Diagnosis not present

## 2023-01-22 DIAGNOSIS — G9529 Other cord compression: Secondary | ICD-10-CM | POA: Diagnosis not present

## 2023-01-22 DIAGNOSIS — M5136 Other intervertebral disc degeneration, lumbar region: Secondary | ICD-10-CM | POA: Diagnosis not present

## 2023-01-22 DIAGNOSIS — E538 Deficiency of other specified B group vitamins: Secondary | ICD-10-CM | POA: Diagnosis not present

## 2023-01-22 DIAGNOSIS — G43909 Migraine, unspecified, not intractable, without status migrainosus: Secondary | ICD-10-CM | POA: Diagnosis not present

## 2023-01-22 DIAGNOSIS — L409 Psoriasis, unspecified: Secondary | ICD-10-CM | POA: Diagnosis not present

## 2023-01-22 DIAGNOSIS — K219 Gastro-esophageal reflux disease without esophagitis: Secondary | ICD-10-CM | POA: Diagnosis not present

## 2023-01-22 DIAGNOSIS — I110 Hypertensive heart disease with heart failure: Secondary | ICD-10-CM | POA: Diagnosis not present

## 2023-01-22 DIAGNOSIS — I5033 Acute on chronic diastolic (congestive) heart failure: Secondary | ICD-10-CM | POA: Diagnosis not present

## 2023-01-22 DIAGNOSIS — L03116 Cellulitis of left lower limb: Secondary | ICD-10-CM | POA: Diagnosis not present

## 2023-01-22 DIAGNOSIS — M4802 Spinal stenosis, cervical region: Secondary | ICD-10-CM | POA: Diagnosis not present

## 2023-01-22 DIAGNOSIS — N390 Urinary tract infection, site not specified: Secondary | ICD-10-CM | POA: Diagnosis not present

## 2023-01-22 DIAGNOSIS — J9601 Acute respiratory failure with hypoxia: Secondary | ICD-10-CM | POA: Diagnosis not present

## 2023-01-22 DIAGNOSIS — E559 Vitamin D deficiency, unspecified: Secondary | ICD-10-CM | POA: Diagnosis not present

## 2023-01-22 DIAGNOSIS — K760 Fatty (change of) liver, not elsewhere classified: Secondary | ICD-10-CM | POA: Diagnosis not present

## 2023-01-23 ENCOUNTER — Encounter: Payer: Self-pay | Admitting: Family Medicine

## 2023-01-23 DIAGNOSIS — G894 Chronic pain syndrome: Secondary | ICD-10-CM | POA: Diagnosis not present

## 2023-01-23 DIAGNOSIS — E785 Hyperlipidemia, unspecified: Secondary | ICD-10-CM | POA: Diagnosis not present

## 2023-01-23 DIAGNOSIS — I5033 Acute on chronic diastolic (congestive) heart failure: Secondary | ICD-10-CM | POA: Diagnosis not present

## 2023-01-23 DIAGNOSIS — I872 Venous insufficiency (chronic) (peripheral): Secondary | ICD-10-CM | POA: Diagnosis not present

## 2023-01-23 DIAGNOSIS — E538 Deficiency of other specified B group vitamins: Secondary | ICD-10-CM | POA: Diagnosis not present

## 2023-01-23 DIAGNOSIS — I110 Hypertensive heart disease with heart failure: Secondary | ICD-10-CM | POA: Diagnosis not present

## 2023-01-23 DIAGNOSIS — L03116 Cellulitis of left lower limb: Secondary | ICD-10-CM | POA: Diagnosis not present

## 2023-01-23 DIAGNOSIS — G43909 Migraine, unspecified, not intractable, without status migrainosus: Secondary | ICD-10-CM | POA: Diagnosis not present

## 2023-01-23 DIAGNOSIS — M4802 Spinal stenosis, cervical region: Secondary | ICD-10-CM | POA: Diagnosis not present

## 2023-01-23 DIAGNOSIS — G629 Polyneuropathy, unspecified: Secondary | ICD-10-CM | POA: Diagnosis not present

## 2023-01-23 DIAGNOSIS — E559 Vitamin D deficiency, unspecified: Secondary | ICD-10-CM | POA: Diagnosis not present

## 2023-01-23 DIAGNOSIS — J45909 Unspecified asthma, uncomplicated: Secondary | ICD-10-CM | POA: Diagnosis not present

## 2023-01-23 DIAGNOSIS — M797 Fibromyalgia: Secondary | ICD-10-CM | POA: Diagnosis not present

## 2023-01-23 DIAGNOSIS — G9529 Other cord compression: Secondary | ICD-10-CM | POA: Diagnosis not present

## 2023-01-23 DIAGNOSIS — G4733 Obstructive sleep apnea (adult) (pediatric): Secondary | ICD-10-CM | POA: Diagnosis not present

## 2023-01-23 DIAGNOSIS — J9601 Acute respiratory failure with hypoxia: Secondary | ICD-10-CM | POA: Diagnosis not present

## 2023-01-23 DIAGNOSIS — M300 Polyarteritis nodosa: Secondary | ICD-10-CM | POA: Diagnosis not present

## 2023-01-23 DIAGNOSIS — K760 Fatty (change of) liver, not elsewhere classified: Secondary | ICD-10-CM | POA: Diagnosis not present

## 2023-01-23 DIAGNOSIS — N390 Urinary tract infection, site not specified: Secondary | ICD-10-CM | POA: Diagnosis not present

## 2023-01-23 DIAGNOSIS — M5136 Other intervertebral disc degeneration, lumbar region: Secondary | ICD-10-CM | POA: Diagnosis not present

## 2023-01-23 DIAGNOSIS — K219 Gastro-esophageal reflux disease without esophagitis: Secondary | ICD-10-CM | POA: Diagnosis not present

## 2023-01-23 DIAGNOSIS — L409 Psoriasis, unspecified: Secondary | ICD-10-CM | POA: Diagnosis not present

## 2023-01-23 NOTE — Telephone Encounter (Signed)
Forms faxed and sent to scan  °

## 2023-01-23 NOTE — Telephone Encounter (Signed)
Signed and returned to [place] on 9/6

## 2023-01-24 ENCOUNTER — Telehealth: Payer: Self-pay | Admitting: Family Medicine

## 2023-01-24 NOTE — Telephone Encounter (Signed)
Received forms from [facility] Printed & placed in provider bin

## 2023-01-24 NOTE — Telephone Encounter (Signed)
Forms are in Dr Beverely Low to be signed folder

## 2023-01-24 NOTE — Telephone Encounter (Signed)
Dr Beverely Low completed  the forms and they have been faxed and placed in scan

## 2023-01-25 ENCOUNTER — Telehealth: Payer: Self-pay | Admitting: Family Medicine

## 2023-01-25 NOTE — Telephone Encounter (Signed)
Forms signed and returned to [facility]

## 2023-01-25 NOTE — Telephone Encounter (Signed)
Received forms from [facility] Printed & placed in provider bin

## 2023-01-25 NOTE — Telephone Encounter (Signed)
Forms are in Dr Beverely Low to be signed folder

## 2023-01-27 ENCOUNTER — Ambulatory Visit (HOSPITAL_BASED_OUTPATIENT_CLINIC_OR_DEPARTMENT_OTHER): Payer: 59 | Admitting: Pulmonary Disease

## 2023-01-27 ENCOUNTER — Encounter (HOSPITAL_BASED_OUTPATIENT_CLINIC_OR_DEPARTMENT_OTHER): Payer: Self-pay | Admitting: Pulmonary Disease

## 2023-01-28 DIAGNOSIS — L409 Psoriasis, unspecified: Secondary | ICD-10-CM | POA: Diagnosis not present

## 2023-01-28 DIAGNOSIS — G629 Polyneuropathy, unspecified: Secondary | ICD-10-CM | POA: Diagnosis not present

## 2023-01-28 DIAGNOSIS — E559 Vitamin D deficiency, unspecified: Secondary | ICD-10-CM | POA: Diagnosis not present

## 2023-01-28 DIAGNOSIS — G894 Chronic pain syndrome: Secondary | ICD-10-CM | POA: Diagnosis not present

## 2023-01-28 DIAGNOSIS — J9601 Acute respiratory failure with hypoxia: Secondary | ICD-10-CM | POA: Diagnosis not present

## 2023-01-28 DIAGNOSIS — I872 Venous insufficiency (chronic) (peripheral): Secondary | ICD-10-CM | POA: Diagnosis not present

## 2023-01-28 DIAGNOSIS — E538 Deficiency of other specified B group vitamins: Secondary | ICD-10-CM | POA: Diagnosis not present

## 2023-01-28 DIAGNOSIS — J45909 Unspecified asthma, uncomplicated: Secondary | ICD-10-CM | POA: Diagnosis not present

## 2023-01-28 DIAGNOSIS — I5033 Acute on chronic diastolic (congestive) heart failure: Secondary | ICD-10-CM | POA: Diagnosis not present

## 2023-01-28 DIAGNOSIS — N390 Urinary tract infection, site not specified: Secondary | ICD-10-CM | POA: Diagnosis not present

## 2023-01-28 DIAGNOSIS — M4802 Spinal stenosis, cervical region: Secondary | ICD-10-CM | POA: Diagnosis not present

## 2023-01-28 DIAGNOSIS — L03116 Cellulitis of left lower limb: Secondary | ICD-10-CM | POA: Diagnosis not present

## 2023-01-28 DIAGNOSIS — K760 Fatty (change of) liver, not elsewhere classified: Secondary | ICD-10-CM | POA: Diagnosis not present

## 2023-01-28 DIAGNOSIS — G4733 Obstructive sleep apnea (adult) (pediatric): Secondary | ICD-10-CM | POA: Diagnosis not present

## 2023-01-28 DIAGNOSIS — G9529 Other cord compression: Secondary | ICD-10-CM | POA: Diagnosis not present

## 2023-01-28 DIAGNOSIS — M797 Fibromyalgia: Secondary | ICD-10-CM | POA: Diagnosis not present

## 2023-01-28 DIAGNOSIS — E785 Hyperlipidemia, unspecified: Secondary | ICD-10-CM | POA: Diagnosis not present

## 2023-01-28 DIAGNOSIS — I110 Hypertensive heart disease with heart failure: Secondary | ICD-10-CM | POA: Diagnosis not present

## 2023-01-28 DIAGNOSIS — K219 Gastro-esophageal reflux disease without esophagitis: Secondary | ICD-10-CM | POA: Diagnosis not present

## 2023-01-28 DIAGNOSIS — G43909 Migraine, unspecified, not intractable, without status migrainosus: Secondary | ICD-10-CM | POA: Diagnosis not present

## 2023-01-28 DIAGNOSIS — M300 Polyarteritis nodosa: Secondary | ICD-10-CM | POA: Diagnosis not present

## 2023-01-28 DIAGNOSIS — M5136 Other intervertebral disc degeneration, lumbar region: Secondary | ICD-10-CM | POA: Diagnosis not present

## 2023-01-29 DIAGNOSIS — I872 Venous insufficiency (chronic) (peripheral): Secondary | ICD-10-CM | POA: Diagnosis not present

## 2023-01-29 DIAGNOSIS — L409 Psoriasis, unspecified: Secondary | ICD-10-CM | POA: Diagnosis not present

## 2023-01-29 DIAGNOSIS — G629 Polyneuropathy, unspecified: Secondary | ICD-10-CM | POA: Diagnosis not present

## 2023-01-29 DIAGNOSIS — N390 Urinary tract infection, site not specified: Secondary | ICD-10-CM | POA: Diagnosis not present

## 2023-01-29 DIAGNOSIS — E785 Hyperlipidemia, unspecified: Secondary | ICD-10-CM | POA: Diagnosis not present

## 2023-01-29 DIAGNOSIS — E559 Vitamin D deficiency, unspecified: Secondary | ICD-10-CM | POA: Diagnosis not present

## 2023-01-29 DIAGNOSIS — M5136 Other intervertebral disc degeneration, lumbar region: Secondary | ICD-10-CM | POA: Diagnosis not present

## 2023-01-29 DIAGNOSIS — G43909 Migraine, unspecified, not intractable, without status migrainosus: Secondary | ICD-10-CM | POA: Diagnosis not present

## 2023-01-29 DIAGNOSIS — M4802 Spinal stenosis, cervical region: Secondary | ICD-10-CM | POA: Diagnosis not present

## 2023-01-29 DIAGNOSIS — G894 Chronic pain syndrome: Secondary | ICD-10-CM | POA: Diagnosis not present

## 2023-01-29 DIAGNOSIS — M300 Polyarteritis nodosa: Secondary | ICD-10-CM | POA: Diagnosis not present

## 2023-01-29 DIAGNOSIS — E538 Deficiency of other specified B group vitamins: Secondary | ICD-10-CM | POA: Diagnosis not present

## 2023-01-29 DIAGNOSIS — I110 Hypertensive heart disease with heart failure: Secondary | ICD-10-CM | POA: Diagnosis not present

## 2023-01-29 DIAGNOSIS — J45909 Unspecified asthma, uncomplicated: Secondary | ICD-10-CM | POA: Diagnosis not present

## 2023-01-29 DIAGNOSIS — G9529 Other cord compression: Secondary | ICD-10-CM | POA: Diagnosis not present

## 2023-01-29 DIAGNOSIS — M797 Fibromyalgia: Secondary | ICD-10-CM | POA: Diagnosis not present

## 2023-01-29 DIAGNOSIS — G4733 Obstructive sleep apnea (adult) (pediatric): Secondary | ICD-10-CM | POA: Diagnosis not present

## 2023-01-29 DIAGNOSIS — K219 Gastro-esophageal reflux disease without esophagitis: Secondary | ICD-10-CM | POA: Diagnosis not present

## 2023-01-29 DIAGNOSIS — I5033 Acute on chronic diastolic (congestive) heart failure: Secondary | ICD-10-CM | POA: Diagnosis not present

## 2023-01-29 DIAGNOSIS — L03116 Cellulitis of left lower limb: Secondary | ICD-10-CM | POA: Diagnosis not present

## 2023-01-29 DIAGNOSIS — J9601 Acute respiratory failure with hypoxia: Secondary | ICD-10-CM | POA: Diagnosis not present

## 2023-01-29 DIAGNOSIS — K760 Fatty (change of) liver, not elsewhere classified: Secondary | ICD-10-CM | POA: Diagnosis not present

## 2023-01-30 ENCOUNTER — Encounter: Payer: Self-pay | Admitting: Pulmonary Disease

## 2023-01-30 DIAGNOSIS — L409 Psoriasis, unspecified: Secondary | ICD-10-CM | POA: Diagnosis not present

## 2023-01-30 DIAGNOSIS — L03116 Cellulitis of left lower limb: Secondary | ICD-10-CM | POA: Diagnosis not present

## 2023-01-30 DIAGNOSIS — E559 Vitamin D deficiency, unspecified: Secondary | ICD-10-CM | POA: Diagnosis not present

## 2023-01-30 DIAGNOSIS — K219 Gastro-esophageal reflux disease without esophagitis: Secondary | ICD-10-CM | POA: Diagnosis not present

## 2023-01-30 DIAGNOSIS — G4733 Obstructive sleep apnea (adult) (pediatric): Secondary | ICD-10-CM | POA: Diagnosis not present

## 2023-01-30 DIAGNOSIS — N390 Urinary tract infection, site not specified: Secondary | ICD-10-CM | POA: Diagnosis not present

## 2023-01-30 DIAGNOSIS — I5033 Acute on chronic diastolic (congestive) heart failure: Secondary | ICD-10-CM | POA: Diagnosis not present

## 2023-01-30 DIAGNOSIS — M4802 Spinal stenosis, cervical region: Secondary | ICD-10-CM | POA: Diagnosis not present

## 2023-01-30 DIAGNOSIS — M5136 Other intervertebral disc degeneration, lumbar region: Secondary | ICD-10-CM | POA: Diagnosis not present

## 2023-01-30 DIAGNOSIS — I110 Hypertensive heart disease with heart failure: Secondary | ICD-10-CM | POA: Diagnosis not present

## 2023-01-30 DIAGNOSIS — E538 Deficiency of other specified B group vitamins: Secondary | ICD-10-CM | POA: Diagnosis not present

## 2023-01-30 DIAGNOSIS — M300 Polyarteritis nodosa: Secondary | ICD-10-CM | POA: Diagnosis not present

## 2023-01-30 DIAGNOSIS — J45909 Unspecified asthma, uncomplicated: Secondary | ICD-10-CM | POA: Diagnosis not present

## 2023-01-30 DIAGNOSIS — M797 Fibromyalgia: Secondary | ICD-10-CM | POA: Diagnosis not present

## 2023-01-30 DIAGNOSIS — G9529 Other cord compression: Secondary | ICD-10-CM | POA: Diagnosis not present

## 2023-01-30 DIAGNOSIS — G43909 Migraine, unspecified, not intractable, without status migrainosus: Secondary | ICD-10-CM | POA: Diagnosis not present

## 2023-01-30 DIAGNOSIS — K760 Fatty (change of) liver, not elsewhere classified: Secondary | ICD-10-CM | POA: Diagnosis not present

## 2023-01-30 DIAGNOSIS — I872 Venous insufficiency (chronic) (peripheral): Secondary | ICD-10-CM | POA: Diagnosis not present

## 2023-01-30 DIAGNOSIS — G629 Polyneuropathy, unspecified: Secondary | ICD-10-CM | POA: Diagnosis not present

## 2023-01-30 DIAGNOSIS — G894 Chronic pain syndrome: Secondary | ICD-10-CM | POA: Diagnosis not present

## 2023-01-30 DIAGNOSIS — E785 Hyperlipidemia, unspecified: Secondary | ICD-10-CM | POA: Diagnosis not present

## 2023-01-30 DIAGNOSIS — J9601 Acute respiratory failure with hypoxia: Secondary | ICD-10-CM | POA: Diagnosis not present

## 2023-01-31 DIAGNOSIS — N390 Urinary tract infection, site not specified: Secondary | ICD-10-CM | POA: Diagnosis not present

## 2023-01-31 DIAGNOSIS — L03116 Cellulitis of left lower limb: Secondary | ICD-10-CM | POA: Diagnosis not present

## 2023-01-31 DIAGNOSIS — I5033 Acute on chronic diastolic (congestive) heart failure: Secondary | ICD-10-CM | POA: Diagnosis not present

## 2023-01-31 DIAGNOSIS — K219 Gastro-esophageal reflux disease without esophagitis: Secondary | ICD-10-CM | POA: Diagnosis not present

## 2023-01-31 DIAGNOSIS — M4802 Spinal stenosis, cervical region: Secondary | ICD-10-CM | POA: Diagnosis not present

## 2023-01-31 DIAGNOSIS — G629 Polyneuropathy, unspecified: Secondary | ICD-10-CM | POA: Diagnosis not present

## 2023-01-31 DIAGNOSIS — I110 Hypertensive heart disease with heart failure: Secondary | ICD-10-CM | POA: Diagnosis not present

## 2023-01-31 DIAGNOSIS — G43909 Migraine, unspecified, not intractable, without status migrainosus: Secondary | ICD-10-CM | POA: Diagnosis not present

## 2023-01-31 DIAGNOSIS — E538 Deficiency of other specified B group vitamins: Secondary | ICD-10-CM | POA: Diagnosis not present

## 2023-01-31 DIAGNOSIS — J45909 Unspecified asthma, uncomplicated: Secondary | ICD-10-CM | POA: Diagnosis not present

## 2023-01-31 DIAGNOSIS — G4733 Obstructive sleep apnea (adult) (pediatric): Secondary | ICD-10-CM | POA: Diagnosis not present

## 2023-01-31 DIAGNOSIS — M5136 Other intervertebral disc degeneration, lumbar region: Secondary | ICD-10-CM | POA: Diagnosis not present

## 2023-01-31 DIAGNOSIS — E785 Hyperlipidemia, unspecified: Secondary | ICD-10-CM | POA: Diagnosis not present

## 2023-01-31 DIAGNOSIS — E559 Vitamin D deficiency, unspecified: Secondary | ICD-10-CM | POA: Diagnosis not present

## 2023-01-31 DIAGNOSIS — J9601 Acute respiratory failure with hypoxia: Secondary | ICD-10-CM | POA: Diagnosis not present

## 2023-01-31 DIAGNOSIS — I872 Venous insufficiency (chronic) (peripheral): Secondary | ICD-10-CM | POA: Diagnosis not present

## 2023-01-31 DIAGNOSIS — L409 Psoriasis, unspecified: Secondary | ICD-10-CM | POA: Diagnosis not present

## 2023-01-31 DIAGNOSIS — M300 Polyarteritis nodosa: Secondary | ICD-10-CM | POA: Diagnosis not present

## 2023-01-31 DIAGNOSIS — G894 Chronic pain syndrome: Secondary | ICD-10-CM | POA: Diagnosis not present

## 2023-01-31 DIAGNOSIS — K760 Fatty (change of) liver, not elsewhere classified: Secondary | ICD-10-CM | POA: Diagnosis not present

## 2023-01-31 DIAGNOSIS — M797 Fibromyalgia: Secondary | ICD-10-CM | POA: Diagnosis not present

## 2023-01-31 DIAGNOSIS — G9529 Other cord compression: Secondary | ICD-10-CM | POA: Diagnosis not present

## 2023-02-01 ENCOUNTER — Telehealth: Payer: Self-pay | Admitting: Family Medicine

## 2023-02-01 DIAGNOSIS — G4733 Obstructive sleep apnea (adult) (pediatric): Secondary | ICD-10-CM | POA: Diagnosis not present

## 2023-02-01 DIAGNOSIS — M300 Polyarteritis nodosa: Secondary | ICD-10-CM | POA: Diagnosis not present

## 2023-02-01 DIAGNOSIS — E538 Deficiency of other specified B group vitamins: Secondary | ICD-10-CM | POA: Diagnosis not present

## 2023-02-01 DIAGNOSIS — L409 Psoriasis, unspecified: Secondary | ICD-10-CM | POA: Diagnosis not present

## 2023-02-01 DIAGNOSIS — E785 Hyperlipidemia, unspecified: Secondary | ICD-10-CM | POA: Diagnosis not present

## 2023-02-01 DIAGNOSIS — K219 Gastro-esophageal reflux disease without esophagitis: Secondary | ICD-10-CM | POA: Diagnosis not present

## 2023-02-01 DIAGNOSIS — L03116 Cellulitis of left lower limb: Secondary | ICD-10-CM | POA: Diagnosis not present

## 2023-02-01 DIAGNOSIS — E559 Vitamin D deficiency, unspecified: Secondary | ICD-10-CM | POA: Diagnosis not present

## 2023-02-01 DIAGNOSIS — K760 Fatty (change of) liver, not elsewhere classified: Secondary | ICD-10-CM | POA: Diagnosis not present

## 2023-02-01 DIAGNOSIS — I872 Venous insufficiency (chronic) (peripheral): Secondary | ICD-10-CM | POA: Diagnosis not present

## 2023-02-01 DIAGNOSIS — G9529 Other cord compression: Secondary | ICD-10-CM | POA: Diagnosis not present

## 2023-02-01 DIAGNOSIS — M4802 Spinal stenosis, cervical region: Secondary | ICD-10-CM | POA: Diagnosis not present

## 2023-02-01 DIAGNOSIS — G629 Polyneuropathy, unspecified: Secondary | ICD-10-CM | POA: Diagnosis not present

## 2023-02-01 DIAGNOSIS — I5033 Acute on chronic diastolic (congestive) heart failure: Secondary | ICD-10-CM | POA: Diagnosis not present

## 2023-02-01 DIAGNOSIS — N390 Urinary tract infection, site not specified: Secondary | ICD-10-CM | POA: Diagnosis not present

## 2023-02-01 DIAGNOSIS — G43909 Migraine, unspecified, not intractable, without status migrainosus: Secondary | ICD-10-CM | POA: Diagnosis not present

## 2023-02-01 DIAGNOSIS — I110 Hypertensive heart disease with heart failure: Secondary | ICD-10-CM | POA: Diagnosis not present

## 2023-02-01 DIAGNOSIS — G894 Chronic pain syndrome: Secondary | ICD-10-CM | POA: Diagnosis not present

## 2023-02-01 DIAGNOSIS — J45909 Unspecified asthma, uncomplicated: Secondary | ICD-10-CM | POA: Diagnosis not present

## 2023-02-01 DIAGNOSIS — M797 Fibromyalgia: Secondary | ICD-10-CM | POA: Diagnosis not present

## 2023-02-01 DIAGNOSIS — M5136 Other intervertebral disc degeneration, lumbar region: Secondary | ICD-10-CM | POA: Diagnosis not present

## 2023-02-01 DIAGNOSIS — J9601 Acute respiratory failure with hypoxia: Secondary | ICD-10-CM | POA: Diagnosis not present

## 2023-02-01 NOTE — Telephone Encounter (Signed)
Received forms from [facility] Printed & placed in provider bin

## 2023-02-01 NOTE — Telephone Encounter (Signed)
Faxed signed papers to [facility]

## 2023-02-01 NOTE — Telephone Encounter (Signed)
Signed and returned to [employer]

## 2023-02-01 NOTE — Telephone Encounter (Signed)
Placed in your sign folder for review

## 2023-02-03 ENCOUNTER — Encounter: Payer: Self-pay | Admitting: Family Medicine

## 2023-02-03 ENCOUNTER — Other Ambulatory Visit: Payer: Self-pay | Admitting: Family Medicine

## 2023-02-03 DIAGNOSIS — G9589 Other specified diseases of spinal cord: Secondary | ICD-10-CM

## 2023-02-03 NOTE — Telephone Encounter (Signed)
Patient is requesting a refill of the following medications: Requested Prescriptions   Pending Prescriptions Disp Refills   HYDROcodone-acetaminophen (NORCO) 10-325 MG tablet [Pharmacy Med Name: hydrocodone 10 mg-acetaminophen 325 mg tablet] 90 tablet 0    Sig: TAKE 1 TABLET BY MOUTH EVERY 8 HOURS AS NEEDED    Date of patient request: 02/03/23 Last office visit: 01/20/23 Date of last refill: 01/04/23 Last refill amount: 90

## 2023-02-03 NOTE — Telephone Encounter (Signed)
Patient is asking about a urine from last Saturday but I do not see any labs or orders on a urine please advise

## 2023-02-06 DIAGNOSIS — E559 Vitamin D deficiency, unspecified: Secondary | ICD-10-CM | POA: Diagnosis not present

## 2023-02-06 DIAGNOSIS — G43909 Migraine, unspecified, not intractable, without status migrainosus: Secondary | ICD-10-CM | POA: Diagnosis not present

## 2023-02-06 DIAGNOSIS — Z9981 Dependence on supplemental oxygen: Secondary | ICD-10-CM | POA: Diagnosis not present

## 2023-02-06 DIAGNOSIS — G894 Chronic pain syndrome: Secondary | ICD-10-CM | POA: Diagnosis not present

## 2023-02-06 DIAGNOSIS — M4802 Spinal stenosis, cervical region: Secondary | ICD-10-CM | POA: Diagnosis not present

## 2023-02-06 DIAGNOSIS — M300 Polyarteritis nodosa: Secondary | ICD-10-CM | POA: Diagnosis not present

## 2023-02-06 DIAGNOSIS — E538 Deficiency of other specified B group vitamins: Secondary | ICD-10-CM | POA: Diagnosis not present

## 2023-02-06 DIAGNOSIS — G629 Polyneuropathy, unspecified: Secondary | ICD-10-CM | POA: Diagnosis not present

## 2023-02-06 DIAGNOSIS — M5136 Other intervertebral disc degeneration, lumbar region: Secondary | ICD-10-CM | POA: Diagnosis not present

## 2023-02-06 DIAGNOSIS — M797 Fibromyalgia: Secondary | ICD-10-CM | POA: Diagnosis not present

## 2023-02-06 DIAGNOSIS — K760 Fatty (change of) liver, not elsewhere classified: Secondary | ICD-10-CM | POA: Diagnosis not present

## 2023-02-06 DIAGNOSIS — I872 Venous insufficiency (chronic) (peripheral): Secondary | ICD-10-CM | POA: Diagnosis not present

## 2023-02-06 DIAGNOSIS — J45901 Unspecified asthma with (acute) exacerbation: Secondary | ICD-10-CM | POA: Diagnosis not present

## 2023-02-06 DIAGNOSIS — J9601 Acute respiratory failure with hypoxia: Secondary | ICD-10-CM | POA: Diagnosis not present

## 2023-02-06 DIAGNOSIS — E876 Hypokalemia: Secondary | ICD-10-CM | POA: Diagnosis not present

## 2023-02-06 DIAGNOSIS — I110 Hypertensive heart disease with heart failure: Secondary | ICD-10-CM | POA: Diagnosis not present

## 2023-02-06 DIAGNOSIS — I5033 Acute on chronic diastolic (congestive) heart failure: Secondary | ICD-10-CM | POA: Diagnosis not present

## 2023-02-06 DIAGNOSIS — G9529 Other cord compression: Secondary | ICD-10-CM | POA: Diagnosis not present

## 2023-02-06 DIAGNOSIS — L400 Psoriasis vulgaris: Secondary | ICD-10-CM | POA: Diagnosis not present

## 2023-02-06 DIAGNOSIS — G4733 Obstructive sleep apnea (adult) (pediatric): Secondary | ICD-10-CM | POA: Diagnosis not present

## 2023-02-06 DIAGNOSIS — K219 Gastro-esophageal reflux disease without esophagitis: Secondary | ICD-10-CM | POA: Diagnosis not present

## 2023-02-06 DIAGNOSIS — E785 Hyperlipidemia, unspecified: Secondary | ICD-10-CM | POA: Diagnosis not present

## 2023-02-07 ENCOUNTER — Other Ambulatory Visit: Payer: Self-pay | Admitting: Family Medicine

## 2023-02-07 ENCOUNTER — Telehealth: Payer: Self-pay

## 2023-02-07 DIAGNOSIS — G629 Polyneuropathy, unspecified: Secondary | ICD-10-CM | POA: Diagnosis not present

## 2023-02-07 DIAGNOSIS — Z9981 Dependence on supplemental oxygen: Secondary | ICD-10-CM | POA: Diagnosis not present

## 2023-02-07 DIAGNOSIS — J9601 Acute respiratory failure with hypoxia: Secondary | ICD-10-CM | POA: Diagnosis not present

## 2023-02-07 DIAGNOSIS — E538 Deficiency of other specified B group vitamins: Secondary | ICD-10-CM | POA: Diagnosis not present

## 2023-02-07 DIAGNOSIS — M4802 Spinal stenosis, cervical region: Secondary | ICD-10-CM | POA: Diagnosis not present

## 2023-02-07 DIAGNOSIS — I872 Venous insufficiency (chronic) (peripheral): Secondary | ICD-10-CM | POA: Diagnosis not present

## 2023-02-07 DIAGNOSIS — J45901 Unspecified asthma with (acute) exacerbation: Secondary | ICD-10-CM | POA: Diagnosis not present

## 2023-02-07 DIAGNOSIS — M797 Fibromyalgia: Secondary | ICD-10-CM | POA: Diagnosis not present

## 2023-02-07 DIAGNOSIS — L400 Psoriasis vulgaris: Secondary | ICD-10-CM | POA: Diagnosis not present

## 2023-02-07 DIAGNOSIS — E876 Hypokalemia: Secondary | ICD-10-CM | POA: Diagnosis not present

## 2023-02-07 DIAGNOSIS — I5033 Acute on chronic diastolic (congestive) heart failure: Secondary | ICD-10-CM | POA: Diagnosis not present

## 2023-02-07 DIAGNOSIS — K219 Gastro-esophageal reflux disease without esophagitis: Secondary | ICD-10-CM | POA: Diagnosis not present

## 2023-02-07 DIAGNOSIS — M300 Polyarteritis nodosa: Secondary | ICD-10-CM | POA: Diagnosis not present

## 2023-02-07 DIAGNOSIS — G9529 Other cord compression: Secondary | ICD-10-CM | POA: Diagnosis not present

## 2023-02-07 DIAGNOSIS — K760 Fatty (change of) liver, not elsewhere classified: Secondary | ICD-10-CM | POA: Diagnosis not present

## 2023-02-07 DIAGNOSIS — G4733 Obstructive sleep apnea (adult) (pediatric): Secondary | ICD-10-CM | POA: Diagnosis not present

## 2023-02-07 DIAGNOSIS — M5136 Other intervertebral disc degeneration, lumbar region: Secondary | ICD-10-CM | POA: Diagnosis not present

## 2023-02-07 DIAGNOSIS — G894 Chronic pain syndrome: Secondary | ICD-10-CM | POA: Diagnosis not present

## 2023-02-07 DIAGNOSIS — I110 Hypertensive heart disease with heart failure: Secondary | ICD-10-CM | POA: Diagnosis not present

## 2023-02-07 DIAGNOSIS — G43909 Migraine, unspecified, not intractable, without status migrainosus: Secondary | ICD-10-CM | POA: Diagnosis not present

## 2023-02-07 DIAGNOSIS — E785 Hyperlipidemia, unspecified: Secondary | ICD-10-CM | POA: Diagnosis not present

## 2023-02-07 DIAGNOSIS — E559 Vitamin D deficiency, unspecified: Secondary | ICD-10-CM | POA: Diagnosis not present

## 2023-02-07 NOTE — Telephone Encounter (Signed)
Pt asking about urine results?

## 2023-02-07 NOTE — Telephone Encounter (Signed)
Patient is requesting a refill of the following medications: Requested Prescriptions   Pending Prescriptions Disp Refills   phentermine 37.5 MG capsule [Pharmacy Med Name: phentermine 37.5 mg capsule] 90 capsule 0    Sig: TAKE 1 CAPSULE BY MOUTH EVERY MORNING   ALPRAZolam (XANAX) 0.5 MG tablet [Pharmacy Med Name: alprazolam 0.5 mg tablet] 60 tablet 3    Sig: TAKE 1 TABLET BY MOUTH 2 TIMES DAILY    Date of patient request: 02/07/23 Last office visit: 01/20/23 Date of last refill: 06/30/22, 10/20/22 Last refill amount: 90, 60 Follow up time period per chart: 3 months

## 2023-02-07 NOTE — Telephone Encounter (Signed)
Placed in sign folder. 

## 2023-02-09 NOTE — Telephone Encounter (Signed)
Forms completed and returned to [employer]

## 2023-02-10 ENCOUNTER — Telehealth: Payer: Self-pay

## 2023-02-10 ENCOUNTER — Telehealth: Payer: Self-pay | Admitting: Family Medicine

## 2023-02-10 MED ORDER — CIPROFLOXACIN HCL 500 MG PO TABS: 500.0000 mg | ORAL_TABLET | Freq: Two times a day (BID) | ORAL | 0 refills | Status: AC

## 2023-02-10 MED ORDER — CALCIUM CITRATE 950 (200 CA) MG PO TABS: 1.0000 | ORAL_TABLET | Freq: Every day | ORAL | 1 refills | Status: DC

## 2023-02-10 MED ORDER — BIOTIN 10 MG PO TABS: 10.0000 mg | ORAL_TABLET | Freq: Every morning | ORAL | 1 refills | Status: DC

## 2023-02-10 MED ORDER — FUROSEMIDE 40 MG PO TABS: ORAL_TABLET | ORAL | 1 refills | Status: DC

## 2023-02-10 NOTE — Addendum Note (Signed)
Addended by: Sheliah Hatch on: 02/10/2023 04:24 PM   Modules accepted: Orders

## 2023-02-10 NOTE — Telephone Encounter (Signed)
Per patient [facility] would not release results to her and she is experiencing dysuria and abnormal urine output  Called [facility] for results and they are faxing these over now  Did inform patient that she can push for results in the future as it is her right as a patient to her own results

## 2023-02-10 NOTE — Telephone Encounter (Signed)
Received a fax from patient's pharmacy requesting refills on biotin 16109 mcg, furosemide 40 mg and Calcium citrate 950 mg. Refills sent to patient pharmacy

## 2023-02-10 NOTE — Telephone Encounter (Signed)
Urine looked very suspicious for infection even though the culture was negative (2 or more bacteria present).  Will start antibiotics as she tends to get sick quickly and want to avoid that if possible.  Prescription for Cipro was sent to [facility]

## 2023-02-10 NOTE — Telephone Encounter (Signed)
Pt has been informed and will pick this up

## 2023-02-10 NOTE — Telephone Encounter (Signed)
Results will be faxed to [place] within 15 min

## 2023-02-10 NOTE — Telephone Encounter (Signed)
Results are in Dr. Rennis Golden folder to review

## 2023-02-10 NOTE — Telephone Encounter (Signed)
Faxed forms back to [facility] on 02/09/23

## 2023-02-10 NOTE — Telephone Encounter (Signed)
Medication: Calcium Citrate 950 mg  Directions: Take 1 tablet by mouth daily  Last given: 10/25/22 Number refills: 3 Last o/v: 09/06/2-Video Visit Follow up: 12/07/23 (Medicare AWV) Labs:   Medication: Biotin 81191 mcg  Directions: Take 1 tablet by mouth every morning  Last given: 08/10/22 Number refills: 1 Last o/v: 09/06/2-Video Visit Follow up: 12/07/23 (Medicare AWV) Labs:   Medication: Furosemide 40 mg  Directions: Take 1 tablet by mouth daily. May take additional tablet if weight gain 5 lbs and call MD for further instructions  Last given: 01/10/23 Number refills: 1 Last o/v: 09/06/2-Video Visit Follow up: 12/07/23 (Medicare AWV) Labs:

## 2023-02-10 NOTE — Telephone Encounter (Signed)
Caller name: LASHAYA KIENITZ  On DPR?: Yes  Call back number: [phone] (home)  Provider they see: Sheliah Hatch, MD  Reason for call:  Pamella says that a couple weekends ago she gave a urine sample at her home and she has not gotten the results of that test.   She would like the results because she is still in pain.

## 2023-02-13 ENCOUNTER — Encounter: Payer: Self-pay | Admitting: Family Medicine

## 2023-02-13 DIAGNOSIS — J9601 Acute respiratory failure with hypoxia: Secondary | ICD-10-CM | POA: Diagnosis not present

## 2023-02-13 DIAGNOSIS — K760 Fatty (change of) liver, not elsewhere classified: Secondary | ICD-10-CM | POA: Diagnosis not present

## 2023-02-13 DIAGNOSIS — M797 Fibromyalgia: Secondary | ICD-10-CM | POA: Diagnosis not present

## 2023-02-13 DIAGNOSIS — A419 Sepsis, unspecified organism: Secondary | ICD-10-CM | POA: Diagnosis not present

## 2023-02-13 DIAGNOSIS — Z9981 Dependence on supplemental oxygen: Secondary | ICD-10-CM | POA: Diagnosis not present

## 2023-02-13 DIAGNOSIS — G894 Chronic pain syndrome: Secondary | ICD-10-CM | POA: Diagnosis not present

## 2023-02-13 DIAGNOSIS — E871 Hypo-osmolality and hyponatremia: Secondary | ICD-10-CM | POA: Diagnosis not present

## 2023-02-13 DIAGNOSIS — I110 Hypertensive heart disease with heart failure: Secondary | ICD-10-CM | POA: Diagnosis not present

## 2023-02-13 DIAGNOSIS — E785 Hyperlipidemia, unspecified: Secondary | ICD-10-CM | POA: Diagnosis not present

## 2023-02-13 DIAGNOSIS — K219 Gastro-esophageal reflux disease without esophagitis: Secondary | ICD-10-CM | POA: Diagnosis not present

## 2023-02-13 DIAGNOSIS — J45901 Unspecified asthma with (acute) exacerbation: Secondary | ICD-10-CM | POA: Diagnosis not present

## 2023-02-13 DIAGNOSIS — M4802 Spinal stenosis, cervical region: Secondary | ICD-10-CM | POA: Diagnosis not present

## 2023-02-13 DIAGNOSIS — E538 Deficiency of other specified B group vitamins: Secondary | ICD-10-CM | POA: Diagnosis not present

## 2023-02-13 DIAGNOSIS — L400 Psoriasis vulgaris: Secondary | ICD-10-CM | POA: Diagnosis not present

## 2023-02-13 DIAGNOSIS — G4733 Obstructive sleep apnea (adult) (pediatric): Secondary | ICD-10-CM | POA: Diagnosis not present

## 2023-02-13 DIAGNOSIS — G629 Polyneuropathy, unspecified: Secondary | ICD-10-CM | POA: Diagnosis not present

## 2023-02-13 DIAGNOSIS — M5136 Other intervertebral disc degeneration, lumbar region: Secondary | ICD-10-CM | POA: Diagnosis not present

## 2023-02-13 DIAGNOSIS — I872 Venous insufficiency (chronic) (peripheral): Secondary | ICD-10-CM | POA: Diagnosis not present

## 2023-02-13 DIAGNOSIS — G9529 Other cord compression: Secondary | ICD-10-CM | POA: Diagnosis not present

## 2023-02-13 DIAGNOSIS — E559 Vitamin D deficiency, unspecified: Secondary | ICD-10-CM | POA: Diagnosis not present

## 2023-02-13 DIAGNOSIS — M300 Polyarteritis nodosa: Secondary | ICD-10-CM | POA: Diagnosis not present

## 2023-02-13 DIAGNOSIS — G43909 Migraine, unspecified, not intractable, without status migrainosus: Secondary | ICD-10-CM | POA: Diagnosis not present

## 2023-02-13 DIAGNOSIS — I5033 Acute on chronic diastolic (congestive) heart failure: Secondary | ICD-10-CM | POA: Diagnosis not present

## 2023-02-13 DIAGNOSIS — E876 Hypokalemia: Secondary | ICD-10-CM | POA: Diagnosis not present

## 2023-02-14 DIAGNOSIS — G894 Chronic pain syndrome: Secondary | ICD-10-CM | POA: Diagnosis not present

## 2023-02-14 DIAGNOSIS — G629 Polyneuropathy, unspecified: Secondary | ICD-10-CM | POA: Diagnosis not present

## 2023-02-14 DIAGNOSIS — J45901 Unspecified asthma with (acute) exacerbation: Secondary | ICD-10-CM | POA: Diagnosis not present

## 2023-02-14 DIAGNOSIS — L400 Psoriasis vulgaris: Secondary | ICD-10-CM | POA: Diagnosis not present

## 2023-02-14 DIAGNOSIS — E785 Hyperlipidemia, unspecified: Secondary | ICD-10-CM | POA: Diagnosis not present

## 2023-02-14 DIAGNOSIS — K760 Fatty (change of) liver, not elsewhere classified: Secondary | ICD-10-CM | POA: Diagnosis not present

## 2023-02-14 DIAGNOSIS — M4802 Spinal stenosis, cervical region: Secondary | ICD-10-CM | POA: Diagnosis not present

## 2023-02-14 DIAGNOSIS — E876 Hypokalemia: Secondary | ICD-10-CM | POA: Diagnosis not present

## 2023-02-14 DIAGNOSIS — J9601 Acute respiratory failure with hypoxia: Secondary | ICD-10-CM | POA: Diagnosis not present

## 2023-02-14 DIAGNOSIS — I872 Venous insufficiency (chronic) (peripheral): Secondary | ICD-10-CM | POA: Diagnosis not present

## 2023-02-14 DIAGNOSIS — I5033 Acute on chronic diastolic (congestive) heart failure: Secondary | ICD-10-CM | POA: Diagnosis not present

## 2023-02-14 DIAGNOSIS — M797 Fibromyalgia: Secondary | ICD-10-CM | POA: Diagnosis not present

## 2023-02-14 DIAGNOSIS — K219 Gastro-esophageal reflux disease without esophagitis: Secondary | ICD-10-CM | POA: Diagnosis not present

## 2023-02-14 DIAGNOSIS — G9529 Other cord compression: Secondary | ICD-10-CM | POA: Diagnosis not present

## 2023-02-14 DIAGNOSIS — E559 Vitamin D deficiency, unspecified: Secondary | ICD-10-CM | POA: Diagnosis not present

## 2023-02-14 DIAGNOSIS — M300 Polyarteritis nodosa: Secondary | ICD-10-CM | POA: Diagnosis not present

## 2023-02-14 DIAGNOSIS — G4733 Obstructive sleep apnea (adult) (pediatric): Secondary | ICD-10-CM | POA: Diagnosis not present

## 2023-02-14 DIAGNOSIS — E538 Deficiency of other specified B group vitamins: Secondary | ICD-10-CM | POA: Diagnosis not present

## 2023-02-14 DIAGNOSIS — G43909 Migraine, unspecified, not intractable, without status migrainosus: Secondary | ICD-10-CM | POA: Diagnosis not present

## 2023-02-14 DIAGNOSIS — Z9981 Dependence on supplemental oxygen: Secondary | ICD-10-CM | POA: Diagnosis not present

## 2023-02-14 DIAGNOSIS — I110 Hypertensive heart disease with heart failure: Secondary | ICD-10-CM | POA: Diagnosis not present

## 2023-02-18 DIAGNOSIS — G9529 Other cord compression: Secondary | ICD-10-CM | POA: Diagnosis not present

## 2023-02-18 DIAGNOSIS — J9601 Acute respiratory failure with hypoxia: Secondary | ICD-10-CM | POA: Diagnosis not present

## 2023-02-18 DIAGNOSIS — J45901 Unspecified asthma with (acute) exacerbation: Secondary | ICD-10-CM | POA: Diagnosis not present

## 2023-02-18 DIAGNOSIS — L400 Psoriasis vulgaris: Secondary | ICD-10-CM | POA: Diagnosis not present

## 2023-02-18 DIAGNOSIS — G894 Chronic pain syndrome: Secondary | ICD-10-CM | POA: Diagnosis not present

## 2023-02-18 DIAGNOSIS — M4802 Spinal stenosis, cervical region: Secondary | ICD-10-CM | POA: Diagnosis not present

## 2023-02-18 DIAGNOSIS — G4733 Obstructive sleep apnea (adult) (pediatric): Secondary | ICD-10-CM | POA: Diagnosis not present

## 2023-02-18 DIAGNOSIS — G43909 Migraine, unspecified, not intractable, without status migrainosus: Secondary | ICD-10-CM | POA: Diagnosis not present

## 2023-02-18 DIAGNOSIS — I5033 Acute on chronic diastolic (congestive) heart failure: Secondary | ICD-10-CM | POA: Diagnosis not present

## 2023-02-18 DIAGNOSIS — G629 Polyneuropathy, unspecified: Secondary | ICD-10-CM | POA: Diagnosis not present

## 2023-02-18 DIAGNOSIS — I110 Hypertensive heart disease with heart failure: Secondary | ICD-10-CM | POA: Diagnosis not present

## 2023-02-18 DIAGNOSIS — Z9981 Dependence on supplemental oxygen: Secondary | ICD-10-CM | POA: Diagnosis not present

## 2023-02-18 DIAGNOSIS — E785 Hyperlipidemia, unspecified: Secondary | ICD-10-CM | POA: Diagnosis not present

## 2023-02-18 DIAGNOSIS — E876 Hypokalemia: Secondary | ICD-10-CM | POA: Diagnosis not present

## 2023-02-18 DIAGNOSIS — K219 Gastro-esophageal reflux disease without esophagitis: Secondary | ICD-10-CM | POA: Diagnosis not present

## 2023-02-18 DIAGNOSIS — I872 Venous insufficiency (chronic) (peripheral): Secondary | ICD-10-CM | POA: Diagnosis not present

## 2023-02-18 DIAGNOSIS — M300 Polyarteritis nodosa: Secondary | ICD-10-CM | POA: Diagnosis not present

## 2023-02-18 DIAGNOSIS — M797 Fibromyalgia: Secondary | ICD-10-CM | POA: Diagnosis not present

## 2023-02-18 DIAGNOSIS — E538 Deficiency of other specified B group vitamins: Secondary | ICD-10-CM | POA: Diagnosis not present

## 2023-02-18 DIAGNOSIS — K760 Fatty (change of) liver, not elsewhere classified: Secondary | ICD-10-CM | POA: Diagnosis not present

## 2023-02-18 DIAGNOSIS — E559 Vitamin D deficiency, unspecified: Secondary | ICD-10-CM | POA: Diagnosis not present

## 2023-02-20 DIAGNOSIS — E538 Deficiency of other specified B group vitamins: Secondary | ICD-10-CM | POA: Diagnosis not present

## 2023-02-20 DIAGNOSIS — M300 Polyarteritis nodosa: Secondary | ICD-10-CM | POA: Diagnosis not present

## 2023-02-20 DIAGNOSIS — Z9981 Dependence on supplemental oxygen: Secondary | ICD-10-CM | POA: Diagnosis not present

## 2023-02-20 DIAGNOSIS — G629 Polyneuropathy, unspecified: Secondary | ICD-10-CM | POA: Diagnosis not present

## 2023-02-20 DIAGNOSIS — E876 Hypokalemia: Secondary | ICD-10-CM | POA: Diagnosis not present

## 2023-02-20 DIAGNOSIS — G4733 Obstructive sleep apnea (adult) (pediatric): Secondary | ICD-10-CM | POA: Diagnosis not present

## 2023-02-20 DIAGNOSIS — J9601 Acute respiratory failure with hypoxia: Secondary | ICD-10-CM | POA: Diagnosis not present

## 2023-02-20 DIAGNOSIS — E785 Hyperlipidemia, unspecified: Secondary | ICD-10-CM | POA: Diagnosis not present

## 2023-02-20 DIAGNOSIS — M797 Fibromyalgia: Secondary | ICD-10-CM | POA: Diagnosis not present

## 2023-02-20 DIAGNOSIS — M4802 Spinal stenosis, cervical region: Secondary | ICD-10-CM | POA: Diagnosis not present

## 2023-02-20 DIAGNOSIS — G894 Chronic pain syndrome: Secondary | ICD-10-CM | POA: Diagnosis not present

## 2023-02-20 DIAGNOSIS — G43909 Migraine, unspecified, not intractable, without status migrainosus: Secondary | ICD-10-CM | POA: Diagnosis not present

## 2023-02-20 DIAGNOSIS — I110 Hypertensive heart disease with heart failure: Secondary | ICD-10-CM | POA: Diagnosis not present

## 2023-02-20 DIAGNOSIS — J45901 Unspecified asthma with (acute) exacerbation: Secondary | ICD-10-CM | POA: Diagnosis not present

## 2023-02-20 DIAGNOSIS — K760 Fatty (change of) liver, not elsewhere classified: Secondary | ICD-10-CM | POA: Diagnosis not present

## 2023-02-20 DIAGNOSIS — G9529 Other cord compression: Secondary | ICD-10-CM | POA: Diagnosis not present

## 2023-02-20 DIAGNOSIS — K219 Gastro-esophageal reflux disease without esophagitis: Secondary | ICD-10-CM | POA: Diagnosis not present

## 2023-02-20 DIAGNOSIS — E559 Vitamin D deficiency, unspecified: Secondary | ICD-10-CM | POA: Diagnosis not present

## 2023-02-20 DIAGNOSIS — L400 Psoriasis vulgaris: Secondary | ICD-10-CM | POA: Diagnosis not present

## 2023-02-20 DIAGNOSIS — I5033 Acute on chronic diastolic (congestive) heart failure: Secondary | ICD-10-CM | POA: Diagnosis not present

## 2023-02-20 DIAGNOSIS — I872 Venous insufficiency (chronic) (peripheral): Secondary | ICD-10-CM | POA: Diagnosis not present

## 2023-02-21 DIAGNOSIS — G629 Polyneuropathy, unspecified: Secondary | ICD-10-CM | POA: Diagnosis not present

## 2023-02-21 DIAGNOSIS — L400 Psoriasis vulgaris: Secondary | ICD-10-CM | POA: Diagnosis not present

## 2023-02-21 DIAGNOSIS — K219 Gastro-esophageal reflux disease without esophagitis: Secondary | ICD-10-CM | POA: Diagnosis not present

## 2023-02-21 DIAGNOSIS — E559 Vitamin D deficiency, unspecified: Secondary | ICD-10-CM | POA: Diagnosis not present

## 2023-02-21 DIAGNOSIS — G9529 Other cord compression: Secondary | ICD-10-CM | POA: Diagnosis not present

## 2023-02-21 DIAGNOSIS — I110 Hypertensive heart disease with heart failure: Secondary | ICD-10-CM | POA: Diagnosis not present

## 2023-02-21 DIAGNOSIS — G894 Chronic pain syndrome: Secondary | ICD-10-CM | POA: Diagnosis not present

## 2023-02-21 DIAGNOSIS — M300 Polyarteritis nodosa: Secondary | ICD-10-CM | POA: Diagnosis not present

## 2023-02-21 DIAGNOSIS — E785 Hyperlipidemia, unspecified: Secondary | ICD-10-CM | POA: Diagnosis not present

## 2023-02-21 DIAGNOSIS — M797 Fibromyalgia: Secondary | ICD-10-CM | POA: Diagnosis not present

## 2023-02-21 DIAGNOSIS — I5033 Acute on chronic diastolic (congestive) heart failure: Secondary | ICD-10-CM | POA: Diagnosis not present

## 2023-02-21 DIAGNOSIS — E876 Hypokalemia: Secondary | ICD-10-CM | POA: Diagnosis not present

## 2023-02-21 DIAGNOSIS — E538 Deficiency of other specified B group vitamins: Secondary | ICD-10-CM | POA: Diagnosis not present

## 2023-02-21 DIAGNOSIS — J45901 Unspecified asthma with (acute) exacerbation: Secondary | ICD-10-CM | POA: Diagnosis not present

## 2023-02-21 DIAGNOSIS — G43909 Migraine, unspecified, not intractable, without status migrainosus: Secondary | ICD-10-CM | POA: Diagnosis not present

## 2023-02-21 DIAGNOSIS — I872 Venous insufficiency (chronic) (peripheral): Secondary | ICD-10-CM | POA: Diagnosis not present

## 2023-02-21 DIAGNOSIS — M4802 Spinal stenosis, cervical region: Secondary | ICD-10-CM | POA: Diagnosis not present

## 2023-02-21 DIAGNOSIS — Z9981 Dependence on supplemental oxygen: Secondary | ICD-10-CM | POA: Diagnosis not present

## 2023-02-21 DIAGNOSIS — G4733 Obstructive sleep apnea (adult) (pediatric): Secondary | ICD-10-CM | POA: Diagnosis not present

## 2023-02-21 DIAGNOSIS — J9601 Acute respiratory failure with hypoxia: Secondary | ICD-10-CM | POA: Diagnosis not present

## 2023-02-21 DIAGNOSIS — K760 Fatty (change of) liver, not elsewhere classified: Secondary | ICD-10-CM | POA: Diagnosis not present

## 2023-02-24 ENCOUNTER — Telehealth: Payer: Self-pay | Admitting: Family Medicine

## 2023-02-24 NOTE — Telephone Encounter (Signed)
Forms retrieved, placed in tabori's folder for signature

## 2023-02-24 NOTE — Telephone Encounter (Signed)
Signed and returned to [facility]

## 2023-02-24 NOTE — Telephone Encounter (Signed)
Received forms from [facility] Printed & placed in provider bin

## 2023-02-24 NOTE — Telephone Encounter (Signed)
Faxed tp requested number on the form

## 2023-02-26 DIAGNOSIS — G9529 Other cord compression: Secondary | ICD-10-CM | POA: Diagnosis not present

## 2023-02-26 DIAGNOSIS — E785 Hyperlipidemia, unspecified: Secondary | ICD-10-CM | POA: Diagnosis not present

## 2023-02-26 DIAGNOSIS — K760 Fatty (change of) liver, not elsewhere classified: Secondary | ICD-10-CM | POA: Diagnosis not present

## 2023-02-26 DIAGNOSIS — Z9981 Dependence on supplemental oxygen: Secondary | ICD-10-CM | POA: Diagnosis not present

## 2023-02-26 DIAGNOSIS — G43909 Migraine, unspecified, not intractable, without status migrainosus: Secondary | ICD-10-CM | POA: Diagnosis not present

## 2023-02-26 DIAGNOSIS — E538 Deficiency of other specified B group vitamins: Secondary | ICD-10-CM | POA: Diagnosis not present

## 2023-02-26 DIAGNOSIS — I872 Venous insufficiency (chronic) (peripheral): Secondary | ICD-10-CM | POA: Diagnosis not present

## 2023-02-26 DIAGNOSIS — K219 Gastro-esophageal reflux disease without esophagitis: Secondary | ICD-10-CM | POA: Diagnosis not present

## 2023-02-26 DIAGNOSIS — L400 Psoriasis vulgaris: Secondary | ICD-10-CM | POA: Diagnosis not present

## 2023-02-26 DIAGNOSIS — M4802 Spinal stenosis, cervical region: Secondary | ICD-10-CM | POA: Diagnosis not present

## 2023-02-26 DIAGNOSIS — I5033 Acute on chronic diastolic (congestive) heart failure: Secondary | ICD-10-CM | POA: Diagnosis not present

## 2023-02-26 DIAGNOSIS — I110 Hypertensive heart disease with heart failure: Secondary | ICD-10-CM | POA: Diagnosis not present

## 2023-02-26 DIAGNOSIS — M300 Polyarteritis nodosa: Secondary | ICD-10-CM | POA: Diagnosis not present

## 2023-02-26 DIAGNOSIS — E559 Vitamin D deficiency, unspecified: Secondary | ICD-10-CM | POA: Diagnosis not present

## 2023-02-26 DIAGNOSIS — J9601 Acute respiratory failure with hypoxia: Secondary | ICD-10-CM | POA: Diagnosis not present

## 2023-02-26 DIAGNOSIS — G4733 Obstructive sleep apnea (adult) (pediatric): Secondary | ICD-10-CM | POA: Diagnosis not present

## 2023-02-26 DIAGNOSIS — G629 Polyneuropathy, unspecified: Secondary | ICD-10-CM | POA: Diagnosis not present

## 2023-02-26 DIAGNOSIS — J45901 Unspecified asthma with (acute) exacerbation: Secondary | ICD-10-CM | POA: Diagnosis not present

## 2023-02-26 DIAGNOSIS — M797 Fibromyalgia: Secondary | ICD-10-CM | POA: Diagnosis not present

## 2023-02-26 DIAGNOSIS — G894 Chronic pain syndrome: Secondary | ICD-10-CM | POA: Diagnosis not present

## 2023-02-26 DIAGNOSIS — E876 Hypokalemia: Secondary | ICD-10-CM | POA: Diagnosis not present

## 2023-02-27 ENCOUNTER — Telehealth (HOSPITAL_BASED_OUTPATIENT_CLINIC_OR_DEPARTMENT_OTHER): Payer: Self-pay | Admitting: Pulmonary Disease

## 2023-02-27 DIAGNOSIS — I872 Venous insufficiency (chronic) (peripheral): Secondary | ICD-10-CM | POA: Diagnosis not present

## 2023-02-27 DIAGNOSIS — K219 Gastro-esophageal reflux disease without esophagitis: Secondary | ICD-10-CM | POA: Diagnosis not present

## 2023-02-27 DIAGNOSIS — M797 Fibromyalgia: Secondary | ICD-10-CM | POA: Diagnosis not present

## 2023-02-27 DIAGNOSIS — G629 Polyneuropathy, unspecified: Secondary | ICD-10-CM | POA: Diagnosis not present

## 2023-02-27 DIAGNOSIS — G4733 Obstructive sleep apnea (adult) (pediatric): Secondary | ICD-10-CM | POA: Diagnosis not present

## 2023-02-27 DIAGNOSIS — E559 Vitamin D deficiency, unspecified: Secondary | ICD-10-CM | POA: Diagnosis not present

## 2023-02-27 DIAGNOSIS — G43909 Migraine, unspecified, not intractable, without status migrainosus: Secondary | ICD-10-CM | POA: Diagnosis not present

## 2023-02-27 DIAGNOSIS — E538 Deficiency of other specified B group vitamins: Secondary | ICD-10-CM | POA: Diagnosis not present

## 2023-02-27 DIAGNOSIS — M300 Polyarteritis nodosa: Secondary | ICD-10-CM | POA: Diagnosis not present

## 2023-02-27 DIAGNOSIS — E876 Hypokalemia: Secondary | ICD-10-CM | POA: Diagnosis not present

## 2023-02-27 DIAGNOSIS — Z9981 Dependence on supplemental oxygen: Secondary | ICD-10-CM | POA: Diagnosis not present

## 2023-02-27 DIAGNOSIS — M4802 Spinal stenosis, cervical region: Secondary | ICD-10-CM | POA: Diagnosis not present

## 2023-02-27 DIAGNOSIS — J45901 Unspecified asthma with (acute) exacerbation: Secondary | ICD-10-CM | POA: Diagnosis not present

## 2023-02-27 DIAGNOSIS — K760 Fatty (change of) liver, not elsewhere classified: Secondary | ICD-10-CM | POA: Diagnosis not present

## 2023-02-27 DIAGNOSIS — I110 Hypertensive heart disease with heart failure: Secondary | ICD-10-CM | POA: Diagnosis not present

## 2023-02-27 DIAGNOSIS — J9601 Acute respiratory failure with hypoxia: Secondary | ICD-10-CM | POA: Diagnosis not present

## 2023-02-27 DIAGNOSIS — I5033 Acute on chronic diastolic (congestive) heart failure: Secondary | ICD-10-CM | POA: Diagnosis not present

## 2023-02-27 DIAGNOSIS — G9529 Other cord compression: Secondary | ICD-10-CM | POA: Diagnosis not present

## 2023-02-27 DIAGNOSIS — G894 Chronic pain syndrome: Secondary | ICD-10-CM | POA: Diagnosis not present

## 2023-02-27 DIAGNOSIS — L400 Psoriasis vulgaris: Secondary | ICD-10-CM | POA: Diagnosis not present

## 2023-02-27 DIAGNOSIS — E785 Hyperlipidemia, unspecified: Secondary | ICD-10-CM | POA: Diagnosis not present

## 2023-02-27 MED ORDER — FLUTICASONE-SALMETEROL 250-50 MCG/ACT IN AEPB: 1.0000 | INHALATION_SPRAY | Freq: Two times a day (BID) | RESPIRATORY_TRACT | 5 refills | Status: DC

## 2023-02-27 NOTE — Telephone Encounter (Signed)
Clarified that patient needs to be on Advair 250-50 mcg ONE puff TWICE a day, Albuterol as needed and nebulizer as needed. Elwin Sleight was discontinued. Refills placed for inhalers.  Patient asking for assistance regarding CPAP device. She had stopped using CPAP device due to being out of town when her father passed away. [employer] is asking her to return CPAP device despite her explaining her situation. She has checked with her insurance who has already agreed to continue covering device so she does not understand why [employer] wants the CPAP machine returned.  Staff - please contact [employer] and request to make an allowance for patient to keep CPAP device

## 2023-02-27 NOTE — Telephone Encounter (Signed)
Patient states that she was given duoneb, albuterol, advair, dulera all at last hospital visit in August. She states that they sent her home with oxygen and also ordered her a nebulizer. Patient states that she is only using the albuterol and the advair. She states that she has a CPAP but its in box. Hasn't used it in over a week was told that she had to return it since she wasn't using as she should have been. Can you look at the hospital note. I dont see advair, it looks like it said she was weaned off oxygen but they sent it home with her. She is confused at what she needs to be doing and what she needs to have. She does has an appt 11/2

## 2023-02-27 NOTE — Telephone Encounter (Signed)
Community Message sent to [employer].

## 2023-02-27 NOTE — Telephone Encounter (Signed)
Her O2 levels at home have been 93-95 range.

## 2023-02-28 DIAGNOSIS — G4733 Obstructive sleep apnea (adult) (pediatric): Secondary | ICD-10-CM | POA: Diagnosis not present

## 2023-02-28 DIAGNOSIS — M797 Fibromyalgia: Secondary | ICD-10-CM | POA: Diagnosis not present

## 2023-02-28 DIAGNOSIS — E785 Hyperlipidemia, unspecified: Secondary | ICD-10-CM | POA: Diagnosis not present

## 2023-02-28 DIAGNOSIS — J9601 Acute respiratory failure with hypoxia: Secondary | ICD-10-CM | POA: Diagnosis not present

## 2023-02-28 DIAGNOSIS — M4802 Spinal stenosis, cervical region: Secondary | ICD-10-CM | POA: Diagnosis not present

## 2023-02-28 DIAGNOSIS — I5033 Acute on chronic diastolic (congestive) heart failure: Secondary | ICD-10-CM | POA: Diagnosis not present

## 2023-02-28 DIAGNOSIS — L400 Psoriasis vulgaris: Secondary | ICD-10-CM | POA: Diagnosis not present

## 2023-02-28 DIAGNOSIS — J45901 Unspecified asthma with (acute) exacerbation: Secondary | ICD-10-CM | POA: Diagnosis not present

## 2023-02-28 DIAGNOSIS — E559 Vitamin D deficiency, unspecified: Secondary | ICD-10-CM | POA: Diagnosis not present

## 2023-02-28 DIAGNOSIS — Z9981 Dependence on supplemental oxygen: Secondary | ICD-10-CM | POA: Diagnosis not present

## 2023-02-28 DIAGNOSIS — G894 Chronic pain syndrome: Secondary | ICD-10-CM | POA: Diagnosis not present

## 2023-02-28 DIAGNOSIS — E876 Hypokalemia: Secondary | ICD-10-CM | POA: Diagnosis not present

## 2023-02-28 DIAGNOSIS — E538 Deficiency of other specified B group vitamins: Secondary | ICD-10-CM | POA: Diagnosis not present

## 2023-02-28 DIAGNOSIS — I872 Venous insufficiency (chronic) (peripheral): Secondary | ICD-10-CM | POA: Diagnosis not present

## 2023-02-28 DIAGNOSIS — I110 Hypertensive heart disease with heart failure: Secondary | ICD-10-CM | POA: Diagnosis not present

## 2023-02-28 DIAGNOSIS — G9529 Other cord compression: Secondary | ICD-10-CM | POA: Diagnosis not present

## 2023-02-28 DIAGNOSIS — K760 Fatty (change of) liver, not elsewhere classified: Secondary | ICD-10-CM | POA: Diagnosis not present

## 2023-02-28 DIAGNOSIS — G43909 Migraine, unspecified, not intractable, without status migrainosus: Secondary | ICD-10-CM | POA: Diagnosis not present

## 2023-02-28 DIAGNOSIS — M300 Polyarteritis nodosa: Secondary | ICD-10-CM | POA: Diagnosis not present

## 2023-02-28 DIAGNOSIS — G629 Polyneuropathy, unspecified: Secondary | ICD-10-CM | POA: Diagnosis not present

## 2023-02-28 DIAGNOSIS — K219 Gastro-esophageal reflux disease without esophagitis: Secondary | ICD-10-CM | POA: Diagnosis not present

## 2023-02-28 NOTE — Telephone Encounter (Signed)
New, Bernita Raisin, CMA; New, Mady Haagensen,  I have asked out recovery team to contact patient and explain. I added the local office as well for review.  Thank you,  Luellen Pucker

## 2023-03-01 ENCOUNTER — Other Ambulatory Visit: Payer: Self-pay | Admitting: Family Medicine

## 2023-03-01 NOTE — Telephone Encounter (Signed)
Patient is requesting a refill of the following medications: Requested Prescriptions   Pending Prescriptions Disp Refills   clonazePAM (KLONOPIN) 1 MG tablet [Pharmacy Med Name: clonazepam 1 mg tablet] 30 tablet 3    Sig: TAKE 1 TABLET BY MOUTH AT BEDTIME    Date of patient request: 03/01/23 Last office visit: 01/20/23 Date of last refill: 12/08/22 Last refill amount: 30

## 2023-03-03 ENCOUNTER — Telehealth (INDEPENDENT_AMBULATORY_CARE_PROVIDER_SITE_OTHER): Payer: 59 | Admitting: Family Medicine

## 2023-03-03 ENCOUNTER — Encounter: Payer: Self-pay | Admitting: Family Medicine

## 2023-03-03 DIAGNOSIS — F4321 Adjustment disorder with depressed mood: Secondary | ICD-10-CM

## 2023-03-03 NOTE — Progress Notes (Unsigned)
Virtual Visit via Video   I connected with patient on 03/03/23 at  8:40 AM EDT by a video enabled telemedicine application and verified that I am speaking with the correct person using two identifiers.  Location patient: Home Location provider: [facility], Office Persons participating in the virtual visit: Patient, Provider, CMA Archie Patten H)  I discussed the limitations of evaluation and management by telemedicine and the availability of in person appointments. The patient expressed understanding and agreed to proceed.  Subjective:   HPI:   Medication management- 'there's just so many meds and I don't know if they're working'.  Pt reports feeling 'blah'.  Has not done any grief counseling since recent death of father.  Pt has not cried yet at the loss of her father.  She has been overwhelmed with her own medical issues.  ROS:   See pertinent positives and negatives per HPI.  Patient Active Problem List   Diagnosis Date Noted   Asthma, chronic, unspecified asthma severity, with acute exacerbation 01/12/2023   Hypomagnesemia 01/12/2023   Bedbound 12/14/2022   Acute on chronic heart failure with preserved ejection fraction (HFpEF) (HCC) 11/23/2022   Pressure injury of skin 11/23/2022   Nocturnal hypoxemia 06/21/2022   Elevated LFTs 05/06/2022   Obesity hypoventilation syndrome (HCC) 05/06/2022   Acute respiratory failure with hypoxia (HCC) 04/22/2022   Hypokalemia 04/22/2022   Leg cramps 01/12/2022   Fever of unknown origin 08/12/2021   Sacral pressure sore 07/06/2021   Vitamin B6 deficiency 06/08/2021   Myelomalacia of cervical cord (HCC) 06/06/2021   Compressive cervical cord myelomalacia (HCC) 06/05/2021   B12 deficiency 06/05/2021   Folate deficiency 06/05/2021   Vitamin D deficiency 06/05/2021   Leg weakness, bilateral 06/04/2021   Cervical atypia 01/06/2021   Steatosis of liver 01/06/2021   Hyponatremia 07/27/2019   Urinary urgency 04/04/2018   Chronic  narcotic use 09/07/2016   Acute blood loss anemia 01/14/2016   Multiple gastric ulcers    PAN (polyarteritis nodosa) (HCC) 11/24/2015   GERD (gastroesophageal reflux disease) 01/08/2014   Routine general medical examination at a health care facility 04/23/2013   Bariatric surgery status 10/31/2012   S/P skin biopsy 10/31/2012   Psoriasis 01/09/2012   DDD (degenerative disc disease), lumbar 11/30/2011   Migraine headache    [name] (obstructive sleep apnea) 11/16/2010   HYPERGLYCEMIA, FASTING 10/08/2009   CONTRACTURE OF TENDON 08/17/2009   PARESTHESIA, HANDS 12/23/2008   Leg pain, right 11/05/2008   ADVERSE DRUG REACTION 04/03/2008   PERIPHERAL EDEMA 03/26/2008   Obesity, morbid, BMI 50 or higher (HCC) 01/03/2007   Cellulitis 01/03/2007   Hyperlipidemia 09/22/2006   Fibromyalgia 09/20/2006   Anxiety and depression 06/28/2006   Essential hypertension 06/28/2006    Social History   Tobacco Use   Smoking status: Never   Smokeless tobacco: Never  Substance Use Topics   Alcohol use: No    Current Outpatient Medications:    ALPRAZolam (XANAX) 0.5 MG tablet, TAKE 1 TABLET BY MOUTH 2 TIMES DAILY, Disp: 60 tablet, Rfl: 3   ARIPiprazole (ABILIFY) 15 MG tablet, TAKE 1 TABLET BY MOUTH EVERY DAY, Disp: 90 tablet, Rfl: 1   Armodafinil 250 MG tablet, Take 1 tablet (250 mg total) by mouth in the morning and at bedtime., Disp: 60 tablet, Rfl: 3   Biotin 10 MG TABS, Take 1 tablet (10 mg total) by mouth in the morning., Disp: 90 tablet, Rfl: 1   buPROPion (WELLBUTRIN XL) 300 MG 24 hr tablet, TAKE 1 TABLET BY MOUTH  EVERY DAY, Disp: 30 tablet, Rfl: 2   calcium citrate (CALCITRATE - DOSED IN MG ELEMENTAL CALCIUM) 950 (200 Ca) MG tablet, Take 1 tablet (200 mg of elemental calcium total) by mouth daily., Disp: 90 tablet, Rfl: 1   clonazePAM (KLONOPIN) 1 MG tablet, TAKE 1 TABLET BY MOUTH AT BEDTIME, Disp: 30 tablet, Rfl: 3   cyanocobalamin (VITAMIN B12) 1000 MCG tablet, TAKE 1 TABLET BY MOUTH EVERY DAY,  Disp: 30 tablet, Rfl: 2   cyclobenzaprine (FLEXERIL) 10 MG tablet, TAKE 2 TABLETS BY MOUTH 2 TIMES DAILY, Disp: 120 tablet, Rfl: 2   diclofenac Sodium (VOLTAREN) 1 % GEL, Apply 4 g topically 4 (four) times daily., Disp: 1 g, Rfl: 1   DULoxetine (CYMBALTA) 60 MG capsule, Take 2 capsules (120 mg total) by mouth daily. (Patient taking differently: Take 60 mg by mouth 2 (two) times daily. Pt reported taking two capsules(120mg ) once daily), Disp: 240 capsule, Rfl: 2   FEROSUL 325 (65 Fe) MG tablet, TAKE 1 TABLET BY MOUTH EVERY MORNING WITH BREAKFAST, Disp: 30 tablet, Rfl: 2   fluticasone-salmeterol (ADVAIR) 250-50 MCG/ACT AEPB, Inhale 1 puff into the lungs every 12 (twelve) hours., Disp: 60 each, Rfl: 5   folic acid (FOLVITE) 1 MG tablet, TAKE 1 TABLET BY MOUTH EVERY DAY, Disp: 30 tablet, Rfl: 2   furosemide (LASIX) 40 MG tablet, Take 1 tablet by mouth daily. May take an additional tablet by mouth IF weight gain of 5 lbs and CALL MD for further instructions., Disp: 90 tablet, Rfl: 1   gabapentin (NEURONTIN) 600 MG tablet, Take 1 tablet (600 mg total) by mouth 2 (two) times daily as needed. Pt takes daily - can take up to twice daily, Disp: 60 tablet, Rfl: 1   GNP VITAMIN B-1 100 MG tablet, TAKE 1 TABLET BY MOUTH EVERY DAY, Disp: 30 tablet, Rfl: 2   HYDROcodone-acetaminophen (NORCO) 10-325 MG tablet, TAKE 1 TABLET BY MOUTH EVERY 8 HOURS AS NEEDED, Disp: 90 tablet, Rfl: 0   ipratropium-albuterol (DUONEB) 0.5-2.5 (3) MG/3ML SOLN, Take 3 mLs by nebulization every 6 (six) hours as needed (wheezing/shortness of breath)., Disp: 360 mL, Rfl: 0   nebivolol (BYSTOLIC) 10 MG tablet, TAKE 1 TABLET BY MOUTH EVERY DAY, Disp: 30 tablet, Rfl: 2   nystatin (MYCOSTATIN/NYSTOP) powder, APPLY 1 APPLICATION TOPICALLY 3 TIMES DAILY (Patient taking differently: Apply 1 Application topically 3 (three) times daily as needed (yeast infection).), Disp: 60 g, Rfl: 3   Oxcarbazepine (TRILEPTAL) 300 MG tablet, Take 300 mg by mouth 2  (two) times daily., Disp: , Rfl:    pantoprazole (PROTONIX) 40 MG tablet, TAKE 1 TABLET BY MOUTH 2 TIMES DAILY, Disp: 60 tablet, Rfl: 2   phentermine 37.5 MG capsule, Take 1 capsule (37.5 mg total) by mouth every morning., Disp: 90 capsule, Rfl: 0   promethazine (PHENERGAN) 25 MG tablet, TAKE 1 TABLET BY MOUTH EVERY 6 HOURS AS NEEDED FOR NAUSEA AND VOMITING, Disp: 45 tablet, Rfl: 3   pyridOXINE (VITAMIN B6) 100 MG tablet, TAKE 1 TABLET BY MOUTH EVERY DAY, Disp: 30 tablet, Rfl: 2   potassium chloride (KLOR-CON) 20 MEQ packet, Take 1 packet by mouth daily. Take with lasix., Disp: 30 packet, Rfl: 1  Allergies  Allergen Reactions   Niacin Anaphylaxis, Shortness Of Breath and Swelling    Swelling and "problems breathing"   Sulfamethoxazole-Trimethoprim Anaphylaxis, Swelling, Rash and Other (See Comments)    Throat closed and the eyes became swollen   Aspirin Hives   Bactrim Hives   Benzoin  Compound Rash   Cephalexin Hives    Tolerated ceftriaxone and cefepime 07/2019   Iohexol Hives    Pt treated with PO benedryl   Lisinopril Hives   Naproxen Hives   Pnu-Imune [Pneumococcal Polysaccharide Vaccine] Rash   Sulfonamide Derivatives Rash    Objective:   There were no vitals taken for this visit. AAOx3, NAD NCAT, EOMI No obvious CN deficits Coloring WNL Pt is able to speak clearly, coherently without shortness of breath or increased work of breathing.  Thought process is linear.  Mood is appropriate.   Assessment and Plan:   Grief- ongoing.  Pt has not really dealt w/ father's death as she has been managing her own health issues.  She admits she was doing very well with her mood prior to dad passing, and agrees that maybe it's not that the medication isn't working, it's that she needs to address her grief.  Encouraged her to reach out to Hospice to start grief counseling.  Will hold on changing meds at this time.  Will follow closely.   Neena Rhymes, MD 03/03/2023

## 2023-03-05 DIAGNOSIS — L400 Psoriasis vulgaris: Secondary | ICD-10-CM | POA: Diagnosis not present

## 2023-03-05 DIAGNOSIS — K219 Gastro-esophageal reflux disease without esophagitis: Secondary | ICD-10-CM | POA: Diagnosis not present

## 2023-03-05 DIAGNOSIS — G629 Polyneuropathy, unspecified: Secondary | ICD-10-CM | POA: Diagnosis not present

## 2023-03-05 DIAGNOSIS — E876 Hypokalemia: Secondary | ICD-10-CM | POA: Diagnosis not present

## 2023-03-05 DIAGNOSIS — E538 Deficiency of other specified B group vitamins: Secondary | ICD-10-CM | POA: Diagnosis not present

## 2023-03-05 DIAGNOSIS — I5033 Acute on chronic diastolic (congestive) heart failure: Secondary | ICD-10-CM | POA: Diagnosis not present

## 2023-03-05 DIAGNOSIS — G4733 Obstructive sleep apnea (adult) (pediatric): Secondary | ICD-10-CM | POA: Diagnosis not present

## 2023-03-05 DIAGNOSIS — G9529 Other cord compression: Secondary | ICD-10-CM | POA: Diagnosis not present

## 2023-03-05 DIAGNOSIS — I110 Hypertensive heart disease with heart failure: Secondary | ICD-10-CM | POA: Diagnosis not present

## 2023-03-05 DIAGNOSIS — M797 Fibromyalgia: Secondary | ICD-10-CM | POA: Diagnosis not present

## 2023-03-05 DIAGNOSIS — I872 Venous insufficiency (chronic) (peripheral): Secondary | ICD-10-CM | POA: Diagnosis not present

## 2023-03-05 DIAGNOSIS — J9601 Acute respiratory failure with hypoxia: Secondary | ICD-10-CM | POA: Diagnosis not present

## 2023-03-05 DIAGNOSIS — M4802 Spinal stenosis, cervical region: Secondary | ICD-10-CM | POA: Diagnosis not present

## 2023-03-05 DIAGNOSIS — E785 Hyperlipidemia, unspecified: Secondary | ICD-10-CM | POA: Diagnosis not present

## 2023-03-05 DIAGNOSIS — M300 Polyarteritis nodosa: Secondary | ICD-10-CM | POA: Diagnosis not present

## 2023-03-05 DIAGNOSIS — Z9981 Dependence on supplemental oxygen: Secondary | ICD-10-CM | POA: Diagnosis not present

## 2023-03-05 DIAGNOSIS — G894 Chronic pain syndrome: Secondary | ICD-10-CM | POA: Diagnosis not present

## 2023-03-05 DIAGNOSIS — K760 Fatty (change of) liver, not elsewhere classified: Secondary | ICD-10-CM | POA: Diagnosis not present

## 2023-03-05 DIAGNOSIS — G43909 Migraine, unspecified, not intractable, without status migrainosus: Secondary | ICD-10-CM | POA: Diagnosis not present

## 2023-03-05 DIAGNOSIS — E559 Vitamin D deficiency, unspecified: Secondary | ICD-10-CM | POA: Diagnosis not present

## 2023-03-05 DIAGNOSIS — J45901 Unspecified asthma with (acute) exacerbation: Secondary | ICD-10-CM | POA: Diagnosis not present

## 2023-03-06 ENCOUNTER — Other Ambulatory Visit: Payer: Self-pay | Admitting: Family Medicine

## 2023-03-06 DIAGNOSIS — G4733 Obstructive sleep apnea (adult) (pediatric): Secondary | ICD-10-CM | POA: Diagnosis not present

## 2023-03-06 DIAGNOSIS — E876 Hypokalemia: Secondary | ICD-10-CM | POA: Diagnosis not present

## 2023-03-06 DIAGNOSIS — I872 Venous insufficiency (chronic) (peripheral): Secondary | ICD-10-CM | POA: Diagnosis not present

## 2023-03-06 DIAGNOSIS — E559 Vitamin D deficiency, unspecified: Secondary | ICD-10-CM | POA: Diagnosis not present

## 2023-03-06 DIAGNOSIS — I110 Hypertensive heart disease with heart failure: Secondary | ICD-10-CM | POA: Diagnosis not present

## 2023-03-06 DIAGNOSIS — G9589 Other specified diseases of spinal cord: Secondary | ICD-10-CM

## 2023-03-06 DIAGNOSIS — G9529 Other cord compression: Secondary | ICD-10-CM | POA: Diagnosis not present

## 2023-03-06 DIAGNOSIS — E538 Deficiency of other specified B group vitamins: Secondary | ICD-10-CM | POA: Diagnosis not present

## 2023-03-06 DIAGNOSIS — J9601 Acute respiratory failure with hypoxia: Secondary | ICD-10-CM | POA: Diagnosis not present

## 2023-03-06 DIAGNOSIS — M797 Fibromyalgia: Secondary | ICD-10-CM | POA: Diagnosis not present

## 2023-03-06 DIAGNOSIS — L400 Psoriasis vulgaris: Secondary | ICD-10-CM | POA: Diagnosis not present

## 2023-03-06 DIAGNOSIS — I5033 Acute on chronic diastolic (congestive) heart failure: Secondary | ICD-10-CM | POA: Diagnosis not present

## 2023-03-06 DIAGNOSIS — M300 Polyarteritis nodosa: Secondary | ICD-10-CM | POA: Diagnosis not present

## 2023-03-06 DIAGNOSIS — E785 Hyperlipidemia, unspecified: Secondary | ICD-10-CM | POA: Diagnosis not present

## 2023-03-06 DIAGNOSIS — K219 Gastro-esophageal reflux disease without esophagitis: Secondary | ICD-10-CM | POA: Diagnosis not present

## 2023-03-06 DIAGNOSIS — G894 Chronic pain syndrome: Secondary | ICD-10-CM | POA: Diagnosis not present

## 2023-03-06 DIAGNOSIS — K760 Fatty (change of) liver, not elsewhere classified: Secondary | ICD-10-CM | POA: Diagnosis not present

## 2023-03-06 DIAGNOSIS — G43909 Migraine, unspecified, not intractable, without status migrainosus: Secondary | ICD-10-CM | POA: Diagnosis not present

## 2023-03-06 DIAGNOSIS — G629 Polyneuropathy, unspecified: Secondary | ICD-10-CM | POA: Diagnosis not present

## 2023-03-06 DIAGNOSIS — Z9981 Dependence on supplemental oxygen: Secondary | ICD-10-CM | POA: Diagnosis not present

## 2023-03-06 DIAGNOSIS — J45901 Unspecified asthma with (acute) exacerbation: Secondary | ICD-10-CM | POA: Diagnosis not present

## 2023-03-06 DIAGNOSIS — M4802 Spinal stenosis, cervical region: Secondary | ICD-10-CM | POA: Diagnosis not present

## 2023-03-06 NOTE — Telephone Encounter (Signed)
Last visit 03/03/2023 Last refill 02/03/2023

## 2023-03-07 ENCOUNTER — Other Ambulatory Visit: Payer: Self-pay | Admitting: Family Medicine

## 2023-03-07 ENCOUNTER — Other Ambulatory Visit (HOSPITAL_BASED_OUTPATIENT_CLINIC_OR_DEPARTMENT_OTHER): Payer: Self-pay

## 2023-03-07 ENCOUNTER — Telehealth (HOSPITAL_BASED_OUTPATIENT_CLINIC_OR_DEPARTMENT_OTHER): Payer: Self-pay | Admitting: Pulmonary Disease

## 2023-03-07 DIAGNOSIS — G629 Polyneuropathy, unspecified: Secondary | ICD-10-CM | POA: Diagnosis not present

## 2023-03-07 DIAGNOSIS — G43909 Migraine, unspecified, not intractable, without status migrainosus: Secondary | ICD-10-CM | POA: Diagnosis not present

## 2023-03-07 DIAGNOSIS — M4802 Spinal stenosis, cervical region: Secondary | ICD-10-CM | POA: Diagnosis not present

## 2023-03-07 DIAGNOSIS — K760 Fatty (change of) liver, not elsewhere classified: Secondary | ICD-10-CM | POA: Diagnosis not present

## 2023-03-07 DIAGNOSIS — G894 Chronic pain syndrome: Secondary | ICD-10-CM | POA: Diagnosis not present

## 2023-03-07 DIAGNOSIS — M300 Polyarteritis nodosa: Secondary | ICD-10-CM | POA: Diagnosis not present

## 2023-03-07 DIAGNOSIS — E785 Hyperlipidemia, unspecified: Secondary | ICD-10-CM | POA: Diagnosis not present

## 2023-03-07 DIAGNOSIS — E538 Deficiency of other specified B group vitamins: Secondary | ICD-10-CM | POA: Diagnosis not present

## 2023-03-07 DIAGNOSIS — L400 Psoriasis vulgaris: Secondary | ICD-10-CM | POA: Diagnosis not present

## 2023-03-07 DIAGNOSIS — E559 Vitamin D deficiency, unspecified: Secondary | ICD-10-CM | POA: Diagnosis not present

## 2023-03-07 DIAGNOSIS — I110 Hypertensive heart disease with heart failure: Secondary | ICD-10-CM | POA: Diagnosis not present

## 2023-03-07 DIAGNOSIS — G9529 Other cord compression: Secondary | ICD-10-CM | POA: Diagnosis not present

## 2023-03-07 DIAGNOSIS — E876 Hypokalemia: Secondary | ICD-10-CM | POA: Diagnosis not present

## 2023-03-07 DIAGNOSIS — I872 Venous insufficiency (chronic) (peripheral): Secondary | ICD-10-CM | POA: Diagnosis not present

## 2023-03-07 DIAGNOSIS — Z9981 Dependence on supplemental oxygen: Secondary | ICD-10-CM | POA: Diagnosis not present

## 2023-03-07 DIAGNOSIS — G4733 Obstructive sleep apnea (adult) (pediatric): Secondary | ICD-10-CM | POA: Diagnosis not present

## 2023-03-07 DIAGNOSIS — M797 Fibromyalgia: Secondary | ICD-10-CM | POA: Diagnosis not present

## 2023-03-07 DIAGNOSIS — I5033 Acute on chronic diastolic (congestive) heart failure: Secondary | ICD-10-CM | POA: Diagnosis not present

## 2023-03-07 DIAGNOSIS — K219 Gastro-esophageal reflux disease without esophagitis: Secondary | ICD-10-CM | POA: Diagnosis not present

## 2023-03-07 DIAGNOSIS — J45901 Unspecified asthma with (acute) exacerbation: Secondary | ICD-10-CM | POA: Diagnosis not present

## 2023-03-07 DIAGNOSIS — J9601 Acute respiratory failure with hypoxia: Secondary | ICD-10-CM | POA: Diagnosis not present

## 2023-03-07 NOTE — Telephone Encounter (Signed)
Received response by [employer]:  "On 02/28/23 I message our compliance team and recovery team asking them about this situation and account. here is the reply I received.   Hi all, I have looked over this patient's account & I didn't see any notes or information regarding the pt being out of town due to her father's passing DURING her 90-day trial period. However, I do see lots of contact with the compliance dept with many other reasons why the pt could not use and was giving help. In June, notes said she was taken care of her father - which was 2 months into her trial period of 90 days. Then in July it looks like the pt was contacted again & was in the hospital. Which was 4 days away from her completing her 90-day trial. The pt was only 1% in the total 90 days. If the pt was in the hospital for at least 21 days of the 90-day period, then we could give her an ext to try & meet compliance once more. But if not - see below. With this being [facility] & they follow MCR guidelines - this pt would need to be a restart & will have to have another order & sleep study to get another 90-day trial period. I wouldn't be able to pull this from [employer]. Sorry - Hope this helps, Health visitor - please obtain 90 day compliance report for me to review. If not compliant then we will need to order another sleep study.  However based on [employer]'s message, patient did not likely meet criteria for compliance and will need to repeat sleep study. We certainly understand that social circumstances limited her ability to use the CPAP however medicare guidelines can be strict in coverage. Any further questions about coverage for her CPAP device will need to be directed to [employer] or her primary insurance.  Please update patient on the situation.

## 2023-03-07 NOTE — Telephone Encounter (Signed)
Error

## 2023-03-13 DIAGNOSIS — E876 Hypokalemia: Secondary | ICD-10-CM | POA: Diagnosis not present

## 2023-03-13 DIAGNOSIS — K760 Fatty (change of) liver, not elsewhere classified: Secondary | ICD-10-CM | POA: Diagnosis not present

## 2023-03-13 DIAGNOSIS — M797 Fibromyalgia: Secondary | ICD-10-CM | POA: Diagnosis not present

## 2023-03-13 DIAGNOSIS — K219 Gastro-esophageal reflux disease without esophagitis: Secondary | ICD-10-CM | POA: Diagnosis not present

## 2023-03-13 DIAGNOSIS — E559 Vitamin D deficiency, unspecified: Secondary | ICD-10-CM | POA: Diagnosis not present

## 2023-03-13 DIAGNOSIS — Z9981 Dependence on supplemental oxygen: Secondary | ICD-10-CM | POA: Diagnosis not present

## 2023-03-13 DIAGNOSIS — E785 Hyperlipidemia, unspecified: Secondary | ICD-10-CM | POA: Diagnosis not present

## 2023-03-13 DIAGNOSIS — L400 Psoriasis vulgaris: Secondary | ICD-10-CM | POA: Diagnosis not present

## 2023-03-13 DIAGNOSIS — I5033 Acute on chronic diastolic (congestive) heart failure: Secondary | ICD-10-CM | POA: Diagnosis not present

## 2023-03-13 DIAGNOSIS — I110 Hypertensive heart disease with heart failure: Secondary | ICD-10-CM | POA: Diagnosis not present

## 2023-03-13 DIAGNOSIS — G894 Chronic pain syndrome: Secondary | ICD-10-CM | POA: Diagnosis not present

## 2023-03-13 DIAGNOSIS — J45901 Unspecified asthma with (acute) exacerbation: Secondary | ICD-10-CM | POA: Diagnosis not present

## 2023-03-13 DIAGNOSIS — G629 Polyneuropathy, unspecified: Secondary | ICD-10-CM | POA: Diagnosis not present

## 2023-03-13 DIAGNOSIS — G43909 Migraine, unspecified, not intractable, without status migrainosus: Secondary | ICD-10-CM | POA: Diagnosis not present

## 2023-03-13 DIAGNOSIS — M300 Polyarteritis nodosa: Secondary | ICD-10-CM | POA: Diagnosis not present

## 2023-03-13 DIAGNOSIS — I872 Venous insufficiency (chronic) (peripheral): Secondary | ICD-10-CM | POA: Diagnosis not present

## 2023-03-13 DIAGNOSIS — G4733 Obstructive sleep apnea (adult) (pediatric): Secondary | ICD-10-CM | POA: Diagnosis not present

## 2023-03-13 DIAGNOSIS — E538 Deficiency of other specified B group vitamins: Secondary | ICD-10-CM | POA: Diagnosis not present

## 2023-03-13 DIAGNOSIS — M4802 Spinal stenosis, cervical region: Secondary | ICD-10-CM | POA: Diagnosis not present

## 2023-03-13 DIAGNOSIS — G9529 Other cord compression: Secondary | ICD-10-CM | POA: Diagnosis not present

## 2023-03-13 DIAGNOSIS — J9601 Acute respiratory failure with hypoxia: Secondary | ICD-10-CM | POA: Diagnosis not present

## 2023-03-14 NOTE — Telephone Encounter (Signed)
Forwarding

## 2023-03-15 DIAGNOSIS — Z9981 Dependence on supplemental oxygen: Secondary | ICD-10-CM | POA: Diagnosis not present

## 2023-03-15 DIAGNOSIS — G9529 Other cord compression: Secondary | ICD-10-CM | POA: Diagnosis not present

## 2023-03-15 DIAGNOSIS — E876 Hypokalemia: Secondary | ICD-10-CM | POA: Diagnosis not present

## 2023-03-15 DIAGNOSIS — E785 Hyperlipidemia, unspecified: Secondary | ICD-10-CM | POA: Diagnosis not present

## 2023-03-15 DIAGNOSIS — G43909 Migraine, unspecified, not intractable, without status migrainosus: Secondary | ICD-10-CM | POA: Diagnosis not present

## 2023-03-15 DIAGNOSIS — I5033 Acute on chronic diastolic (congestive) heart failure: Secondary | ICD-10-CM | POA: Diagnosis not present

## 2023-03-15 DIAGNOSIS — M797 Fibromyalgia: Secondary | ICD-10-CM | POA: Diagnosis not present

## 2023-03-15 DIAGNOSIS — L400 Psoriasis vulgaris: Secondary | ICD-10-CM | POA: Diagnosis not present

## 2023-03-15 DIAGNOSIS — E559 Vitamin D deficiency, unspecified: Secondary | ICD-10-CM | POA: Diagnosis not present

## 2023-03-15 DIAGNOSIS — I872 Venous insufficiency (chronic) (peripheral): Secondary | ICD-10-CM | POA: Diagnosis not present

## 2023-03-15 DIAGNOSIS — K219 Gastro-esophageal reflux disease without esophagitis: Secondary | ICD-10-CM | POA: Diagnosis not present

## 2023-03-15 DIAGNOSIS — K760 Fatty (change of) liver, not elsewhere classified: Secondary | ICD-10-CM | POA: Diagnosis not present

## 2023-03-15 DIAGNOSIS — J45901 Unspecified asthma with (acute) exacerbation: Secondary | ICD-10-CM | POA: Diagnosis not present

## 2023-03-15 DIAGNOSIS — I110 Hypertensive heart disease with heart failure: Secondary | ICD-10-CM | POA: Diagnosis not present

## 2023-03-15 DIAGNOSIS — J9601 Acute respiratory failure with hypoxia: Secondary | ICD-10-CM | POA: Diagnosis not present

## 2023-03-15 DIAGNOSIS — E538 Deficiency of other specified B group vitamins: Secondary | ICD-10-CM | POA: Diagnosis not present

## 2023-03-15 DIAGNOSIS — G629 Polyneuropathy, unspecified: Secondary | ICD-10-CM | POA: Diagnosis not present

## 2023-03-15 DIAGNOSIS — G894 Chronic pain syndrome: Secondary | ICD-10-CM | POA: Diagnosis not present

## 2023-03-15 DIAGNOSIS — G4733 Obstructive sleep apnea (adult) (pediatric): Secondary | ICD-10-CM | POA: Diagnosis not present

## 2023-03-15 DIAGNOSIS — M4802 Spinal stenosis, cervical region: Secondary | ICD-10-CM | POA: Diagnosis not present

## 2023-03-15 DIAGNOSIS — M300 Polyarteritis nodosa: Secondary | ICD-10-CM | POA: Diagnosis not present

## 2023-03-15 NOTE — Telephone Encounter (Signed)
The only DL I could pull was from [id number]  I have had this scanned into her chart under media tab   I tried calling pt to relay [employer] msg and there was no answer

## 2023-03-15 NOTE — Telephone Encounter (Signed)
[name], please clarify what you are forwarding? This message is from me.  Has a 90 day compliance report been obtained as requested? And has patient been notified of the [employer] message?

## 2023-03-16 DIAGNOSIS — J45909 Unspecified asthma, uncomplicated: Secondary | ICD-10-CM | POA: Diagnosis not present

## 2023-03-17 DIAGNOSIS — M4802 Spinal stenosis, cervical region: Secondary | ICD-10-CM | POA: Diagnosis not present

## 2023-03-17 DIAGNOSIS — E876 Hypokalemia: Secondary | ICD-10-CM | POA: Diagnosis not present

## 2023-03-17 DIAGNOSIS — E785 Hyperlipidemia, unspecified: Secondary | ICD-10-CM | POA: Diagnosis not present

## 2023-03-17 DIAGNOSIS — M797 Fibromyalgia: Secondary | ICD-10-CM | POA: Diagnosis not present

## 2023-03-17 DIAGNOSIS — I872 Venous insufficiency (chronic) (peripheral): Secondary | ICD-10-CM | POA: Diagnosis not present

## 2023-03-17 DIAGNOSIS — J9601 Acute respiratory failure with hypoxia: Secondary | ICD-10-CM | POA: Diagnosis not present

## 2023-03-17 DIAGNOSIS — G629 Polyneuropathy, unspecified: Secondary | ICD-10-CM | POA: Diagnosis not present

## 2023-03-17 DIAGNOSIS — G894 Chronic pain syndrome: Secondary | ICD-10-CM | POA: Diagnosis not present

## 2023-03-17 DIAGNOSIS — E559 Vitamin D deficiency, unspecified: Secondary | ICD-10-CM | POA: Diagnosis not present

## 2023-03-17 DIAGNOSIS — G43909 Migraine, unspecified, not intractable, without status migrainosus: Secondary | ICD-10-CM | POA: Diagnosis not present

## 2023-03-17 DIAGNOSIS — E538 Deficiency of other specified B group vitamins: Secondary | ICD-10-CM | POA: Diagnosis not present

## 2023-03-17 DIAGNOSIS — I110 Hypertensive heart disease with heart failure: Secondary | ICD-10-CM | POA: Diagnosis not present

## 2023-03-17 DIAGNOSIS — L400 Psoriasis vulgaris: Secondary | ICD-10-CM | POA: Diagnosis not present

## 2023-03-17 DIAGNOSIS — Z9981 Dependence on supplemental oxygen: Secondary | ICD-10-CM | POA: Diagnosis not present

## 2023-03-17 DIAGNOSIS — I5033 Acute on chronic diastolic (congestive) heart failure: Secondary | ICD-10-CM | POA: Diagnosis not present

## 2023-03-17 DIAGNOSIS — K219 Gastro-esophageal reflux disease without esophagitis: Secondary | ICD-10-CM | POA: Diagnosis not present

## 2023-03-17 DIAGNOSIS — G4733 Obstructive sleep apnea (adult) (pediatric): Secondary | ICD-10-CM | POA: Diagnosis not present

## 2023-03-17 DIAGNOSIS — J45901 Unspecified asthma with (acute) exacerbation: Secondary | ICD-10-CM | POA: Diagnosis not present

## 2023-03-17 DIAGNOSIS — K760 Fatty (change of) liver, not elsewhere classified: Secondary | ICD-10-CM | POA: Diagnosis not present

## 2023-03-17 DIAGNOSIS — G9529 Other cord compression: Secondary | ICD-10-CM | POA: Diagnosis not present

## 2023-03-17 DIAGNOSIS — M300 Polyarteritis nodosa: Secondary | ICD-10-CM | POA: Diagnosis not present

## 2023-03-20 DIAGNOSIS — K219 Gastro-esophageal reflux disease without esophagitis: Secondary | ICD-10-CM | POA: Diagnosis not present

## 2023-03-20 DIAGNOSIS — G43909 Migraine, unspecified, not intractable, without status migrainosus: Secondary | ICD-10-CM | POA: Diagnosis not present

## 2023-03-20 DIAGNOSIS — G894 Chronic pain syndrome: Secondary | ICD-10-CM | POA: Diagnosis not present

## 2023-03-20 DIAGNOSIS — E559 Vitamin D deficiency, unspecified: Secondary | ICD-10-CM | POA: Diagnosis not present

## 2023-03-20 DIAGNOSIS — Z9981 Dependence on supplemental oxygen: Secondary | ICD-10-CM | POA: Diagnosis not present

## 2023-03-20 DIAGNOSIS — G4733 Obstructive sleep apnea (adult) (pediatric): Secondary | ICD-10-CM | POA: Diagnosis not present

## 2023-03-20 DIAGNOSIS — I872 Venous insufficiency (chronic) (peripheral): Secondary | ICD-10-CM | POA: Diagnosis not present

## 2023-03-20 DIAGNOSIS — M797 Fibromyalgia: Secondary | ICD-10-CM | POA: Diagnosis not present

## 2023-03-20 DIAGNOSIS — M300 Polyarteritis nodosa: Secondary | ICD-10-CM | POA: Diagnosis not present

## 2023-03-20 DIAGNOSIS — G629 Polyneuropathy, unspecified: Secondary | ICD-10-CM | POA: Diagnosis not present

## 2023-03-20 DIAGNOSIS — G9529 Other cord compression: Secondary | ICD-10-CM | POA: Diagnosis not present

## 2023-03-20 DIAGNOSIS — M4802 Spinal stenosis, cervical region: Secondary | ICD-10-CM | POA: Diagnosis not present

## 2023-03-20 DIAGNOSIS — J9601 Acute respiratory failure with hypoxia: Secondary | ICD-10-CM | POA: Diagnosis not present

## 2023-03-20 DIAGNOSIS — E538 Deficiency of other specified B group vitamins: Secondary | ICD-10-CM | POA: Diagnosis not present

## 2023-03-20 DIAGNOSIS — I110 Hypertensive heart disease with heart failure: Secondary | ICD-10-CM | POA: Diagnosis not present

## 2023-03-20 DIAGNOSIS — K760 Fatty (change of) liver, not elsewhere classified: Secondary | ICD-10-CM | POA: Diagnosis not present

## 2023-03-20 DIAGNOSIS — E785 Hyperlipidemia, unspecified: Secondary | ICD-10-CM | POA: Diagnosis not present

## 2023-03-20 DIAGNOSIS — L400 Psoriasis vulgaris: Secondary | ICD-10-CM | POA: Diagnosis not present

## 2023-03-20 DIAGNOSIS — I5033 Acute on chronic diastolic (congestive) heart failure: Secondary | ICD-10-CM | POA: Diagnosis not present

## 2023-03-20 DIAGNOSIS — J45901 Unspecified asthma with (acute) exacerbation: Secondary | ICD-10-CM | POA: Diagnosis not present

## 2023-03-20 DIAGNOSIS — E876 Hypokalemia: Secondary | ICD-10-CM | POA: Diagnosis not present

## 2023-03-21 DIAGNOSIS — E785 Hyperlipidemia, unspecified: Secondary | ICD-10-CM | POA: Diagnosis not present

## 2023-03-21 DIAGNOSIS — I110 Hypertensive heart disease with heart failure: Secondary | ICD-10-CM | POA: Diagnosis not present

## 2023-03-21 DIAGNOSIS — I5033 Acute on chronic diastolic (congestive) heart failure: Secondary | ICD-10-CM | POA: Diagnosis not present

## 2023-03-21 DIAGNOSIS — E559 Vitamin D deficiency, unspecified: Secondary | ICD-10-CM | POA: Diagnosis not present

## 2023-03-21 DIAGNOSIS — E876 Hypokalemia: Secondary | ICD-10-CM | POA: Diagnosis not present

## 2023-03-21 DIAGNOSIS — L400 Psoriasis vulgaris: Secondary | ICD-10-CM | POA: Diagnosis not present

## 2023-03-21 DIAGNOSIS — M4802 Spinal stenosis, cervical region: Secondary | ICD-10-CM | POA: Diagnosis not present

## 2023-03-21 DIAGNOSIS — K219 Gastro-esophageal reflux disease without esophagitis: Secondary | ICD-10-CM | POA: Diagnosis not present

## 2023-03-21 DIAGNOSIS — G894 Chronic pain syndrome: Secondary | ICD-10-CM | POA: Diagnosis not present

## 2023-03-21 DIAGNOSIS — E538 Deficiency of other specified B group vitamins: Secondary | ICD-10-CM | POA: Diagnosis not present

## 2023-03-21 DIAGNOSIS — K760 Fatty (change of) liver, not elsewhere classified: Secondary | ICD-10-CM | POA: Diagnosis not present

## 2023-03-21 DIAGNOSIS — Z9981 Dependence on supplemental oxygen: Secondary | ICD-10-CM | POA: Diagnosis not present

## 2023-03-21 DIAGNOSIS — G9529 Other cord compression: Secondary | ICD-10-CM | POA: Diagnosis not present

## 2023-03-21 DIAGNOSIS — M300 Polyarteritis nodosa: Secondary | ICD-10-CM | POA: Diagnosis not present

## 2023-03-21 DIAGNOSIS — I872 Venous insufficiency (chronic) (peripheral): Secondary | ICD-10-CM | POA: Diagnosis not present

## 2023-03-21 DIAGNOSIS — G4733 Obstructive sleep apnea (adult) (pediatric): Secondary | ICD-10-CM | POA: Diagnosis not present

## 2023-03-21 DIAGNOSIS — G629 Polyneuropathy, unspecified: Secondary | ICD-10-CM | POA: Diagnosis not present

## 2023-03-21 DIAGNOSIS — J45901 Unspecified asthma with (acute) exacerbation: Secondary | ICD-10-CM | POA: Diagnosis not present

## 2023-03-21 DIAGNOSIS — M797 Fibromyalgia: Secondary | ICD-10-CM | POA: Diagnosis not present

## 2023-03-21 DIAGNOSIS — J9601 Acute respiratory failure with hypoxia: Secondary | ICD-10-CM | POA: Diagnosis not present

## 2023-03-21 DIAGNOSIS — G43909 Migraine, unspecified, not intractable, without status migrainosus: Secondary | ICD-10-CM | POA: Diagnosis not present

## 2023-03-22 ENCOUNTER — Encounter: Payer: Self-pay | Admitting: Family Medicine

## 2023-03-22 ENCOUNTER — Telehealth: Payer: 59 | Admitting: Family Medicine

## 2023-03-22 DIAGNOSIS — F419 Anxiety disorder, unspecified: Secondary | ICD-10-CM | POA: Diagnosis not present

## 2023-03-22 DIAGNOSIS — F32A Depression, unspecified: Secondary | ICD-10-CM

## 2023-03-22 MED ORDER — ALPRAZOLAM 1 MG PO TABS: 1.0000 mg | ORAL_TABLET | Freq: Two times a day (BID) | ORAL | 3 refills | Status: DC

## 2023-03-22 NOTE — Progress Notes (Signed)
Virtual Visit via Video   I connected with patient on 03/22/23 at 10:20 AM EST by a video enabled telemedicine application and verified that I am speaking with the correct person using two identifiers.  Location patient: Home Location provider: [facility], Office Persons participating in the virtual visit: Patient, Provider, CMA Archie Patten H)  I discussed the limitations of evaluation and management by telemedicine and the availability of in person appointments. The patient expressed understanding and agreed to proceed.  Subjective:   HPI:   Anxiety- pt reports she had a panic attack Friday night at 11:30.  This scared her and impacted her ability to breathe.  Pt took an Alprazolam but this did not help.  Sxs lasted 25-30 minutes.  Called 911 and was evaluated.  Was told to call PCP to adjust medications.  Had similar episode on Saturday and then again on Monday.  Currently on Wellbutrin 300mg  daily, Cymbalta 60mg  BID, Abilify 15mg  daily, Alprazolam 0.5mg  BID, and Clonazepam at bedtime.  Pt is starting grief counseling.  Doesn't report feeling overly anxious recently.    ROS:   See pertinent positives and negatives per HPI.  Patient Active Problem List   Diagnosis Date Noted   Asthma, chronic, unspecified asthma severity, with acute exacerbation 01/12/2023   Hypomagnesemia 01/12/2023   Bedbound 12/14/2022   Acute on chronic heart failure with preserved ejection fraction (HFpEF) (HCC) 11/23/2022   Pressure injury of skin 11/23/2022   Nocturnal hypoxemia 06/21/2022   Elevated LFTs 05/06/2022   Obesity hypoventilation syndrome (HCC) 05/06/2022   Acute respiratory failure with hypoxia (HCC) 04/22/2022   Hypokalemia 04/22/2022   Leg cramps 01/12/2022   Fever of unknown origin 08/12/2021   Sacral pressure sore 07/06/2021   Vitamin B6 deficiency 06/08/2021   Myelomalacia of cervical cord (HCC) 06/06/2021   Compressive cervical cord myelomalacia (HCC) 06/05/2021   B12  deficiency 06/05/2021   Folate deficiency 06/05/2021   Vitamin D deficiency 06/05/2021   Leg weakness, bilateral 06/04/2021   Cervical atypia 01/06/2021   Steatosis of liver 01/06/2021   Hyponatremia 07/27/2019   Urinary urgency 04/04/2018   Chronic narcotic use 09/07/2016   Acute blood loss anemia 01/14/2016   Multiple gastric ulcers    PAN (polyarteritis nodosa) (HCC) 11/24/2015   GERD (gastroesophageal reflux disease) 01/08/2014   Routine general medical examination at a health care facility 04/23/2013   Bariatric surgery status 10/31/2012   S/P skin biopsy 10/31/2012   Psoriasis 01/09/2012   DDD (degenerative disc disease), lumbar 11/30/2011   Migraine headache    [name] (obstructive sleep apnea) 11/16/2010   HYPERGLYCEMIA, FASTING 10/08/2009   CONTRACTURE OF TENDON 08/17/2009   PARESTHESIA, HANDS 12/23/2008   Leg pain, right 11/05/2008   ADVERSE DRUG REACTION 04/03/2008   PERIPHERAL EDEMA 03/26/2008   Obesity, morbid, BMI 50 or higher (HCC) 01/03/2007   Cellulitis 01/03/2007   Hyperlipidemia 09/22/2006   Fibromyalgia 09/20/2006   Anxiety and depression 06/28/2006   Essential hypertension 06/28/2006    Social History   Tobacco Use   Smoking status: Never   Smokeless tobacco: Never  Substance Use Topics   Alcohol use: No    Current Outpatient Medications:    ALPRAZolam (XANAX) 0.5 MG tablet, TAKE 1 TABLET BY MOUTH 2 TIMES DAILY, Disp: 60 tablet, Rfl: 3   ARIPiprazole (ABILIFY) 15 MG tablet, TAKE 1 TABLET BY MOUTH EVERY DAY, Disp: 90 tablet, Rfl: 1   Armodafinil 250 MG tablet, Take 1 tablet (250 mg total) by mouth in the morning and at  bedtime., Disp: 60 tablet, Rfl: 3   Biotin 10 MG TABS, Take 1 tablet (10 mg total) by mouth in the morning., Disp: 90 tablet, Rfl: 1   buPROPion (WELLBUTRIN XL) 300 MG 24 hr tablet, TAKE 1 TABLET BY MOUTH EVERY DAY, Disp: 30 tablet, Rfl: 2   calcium citrate (CALCITRATE - DOSED IN MG ELEMENTAL CALCIUM) 950 (200 Ca) MG tablet, Take 1 tablet  (200 mg of elemental calcium total) by mouth daily., Disp: 90 tablet, Rfl: 1   clonazePAM (KLONOPIN) 1 MG tablet, TAKE 1 TABLET BY MOUTH AT BEDTIME, Disp: 30 tablet, Rfl: 3   cyanocobalamin (VITAMIN B12) 1000 MCG tablet, TAKE 1 TABLET BY MOUTH EVERY DAY, Disp: 30 tablet, Rfl: 2   cyclobenzaprine (FLEXERIL) 10 MG tablet, TAKE 2 TABLETS BY MOUTH 2 TIMES DAILY, Disp: 120 tablet, Rfl: 2   diclofenac Sodium (VOLTAREN) 1 % GEL, Apply 4 g topically 4 (four) times daily., Disp: 1 g, Rfl: 1   DULoxetine (CYMBALTA) 60 MG capsule, Take 2 capsules (120 mg total) by mouth daily. (Patient taking differently: Take 60 mg by mouth 2 (two) times daily. Pt reported taking two capsules(120mg ) once daily), Disp: 240 capsule, Rfl: 2   FEROSUL 325 (65 Fe) MG tablet, TAKE 1 TABLET BY MOUTH EVERY MORNING WITH BREAKFAST, Disp: 30 tablet, Rfl: 2   fluticasone-salmeterol (ADVAIR) 250-50 MCG/ACT AEPB, Inhale 1 puff into the lungs every 12 (twelve) hours., Disp: 60 each, Rfl: 5   folic acid (FOLVITE) 1 MG tablet, TAKE 1 TABLET BY MOUTH ONCE DAILY, Disp: 30 tablet, Rfl: 2   furosemide (LASIX) 40 MG tablet, Take 1 tablet by mouth daily. May take an additional tablet by mouth IF weight gain of 5 lbs and CALL MD for further instructions., Disp: 90 tablet, Rfl: 1   gabapentin (NEURONTIN) 600 MG tablet, Take 1 tablet (600 mg total) by mouth 2 (two) times daily as needed. Pt takes daily - can take up to twice daily, Disp: 60 tablet, Rfl: 1   HYDROcodone-acetaminophen (NORCO) 10-325 MG tablet, TAKE 1 TABLET BY MOUTH EVERY 8 HOURS AS NEEDED, Disp: 90 tablet, Rfl: 0   ipratropium-albuterol (DUONEB) 0.5-2.5 (3) MG/3ML SOLN, Take 3 mLs by nebulization every 6 (six) hours as needed (wheezing/shortness of breath)., Disp: 360 mL, Rfl: 0   nebivolol (BYSTOLIC) 10 MG tablet, TAKE 1 TABLET BY MOUTH ONCE DAILY, Disp: 30 tablet, Rfl: 2   nystatin (MYCOSTATIN/NYSTOP) powder, APPLY 1 APPLICATION TOPICALLY 3 TIMES DAILY (Patient taking differently: Apply  1 Application topically 3 (three) times daily as needed (yeast infection).), Disp: 60 g, Rfl: 3   Oxcarbazepine (TRILEPTAL) 300 MG tablet, Take 300 mg by mouth 2 (two) times daily., Disp: , Rfl:    pantoprazole (PROTONIX) 40 MG tablet, TAKE 1 TABLET BY MOUTH 2 TIMES DAILY, Disp: 60 tablet, Rfl: 2   phentermine 37.5 MG capsule, Take 1 capsule (37.5 mg total) by mouth every morning., Disp: 90 capsule, Rfl: 0   promethazine (PHENERGAN) 25 MG tablet, TAKE 1 TABLET BY MOUTH EVERY 6 HOURS AS NEEDED FOR NAUSEA AND VOMITING, Disp: 45 tablet, Rfl: 3   pyridOXINE (VITAMIN B6) 100 MG tablet, TAKE 1 TABLET BY MOUTH EVERY DAY, Disp: 30 tablet, Rfl: 2   Thiamine HCl (B-1) 100 MG TABS, TAKE 1 TABLET BY MOUTH EVERY DAY, Disp: 30 tablet, Rfl: 2   potassium chloride (KLOR-CON) 20 MEQ packet, Take 1 packet by mouth daily. Take with lasix., Disp: 30 packet, Rfl: 1  Allergies  Allergen Reactions   Niacin Anaphylaxis,  Shortness Of Breath and Swelling    Swelling and "problems breathing"   Sulfamethoxazole-Trimethoprim Anaphylaxis, Swelling, Rash and Other (See Comments)    Throat closed and the eyes became swollen   Aspirin Hives   Bactrim Hives   Benzoin Compound Rash   Cephalexin Hives    Tolerated ceftriaxone and cefepime 07/2019   Iohexol Hives    Pt treated with PO benedryl   Lisinopril Hives   Naproxen Hives   Pnu-Imune [Pneumococcal Polysaccharide Vaccine] Rash   Sulfonamide Derivatives Rash    Objective:   There were no vitals taken for this visit. AAOx3, NAD Obese, lying in bed NCAT, EOMI No obvious CN deficits Coloring WNL O2 via [place] in place Pt is able to speak clearly, coherently without shortness of breath or increased work of breathing.  Thought process is linear.  Mood is appropriate.   Assessment and Plan:   Anxiety/depression- deteriorated.  Pt reports she has had 3 episodes which she can only describe as 'an intense panic'.  The first 2 episodes- Friday and Saturday- she called  EMS and they evaluated her and found nothing on vitals or exam.  They encouraged her to reach out for medication adjustment.  She is already on Wellbutrin and Cymbalta.  She took available Alprazolam during these attacks and it didn't make a difference.  Will increased Alprazolam to 1mg  BID as she has likely built up a tolerance over time.  Encouraged her to follow through on grief counseling- she states she will.  Will follow.   Neena Rhymes, MD 03/22/2023

## 2023-03-23 DIAGNOSIS — K219 Gastro-esophageal reflux disease without esophagitis: Secondary | ICD-10-CM | POA: Diagnosis not present

## 2023-03-23 DIAGNOSIS — J9601 Acute respiratory failure with hypoxia: Secondary | ICD-10-CM | POA: Diagnosis not present

## 2023-03-23 DIAGNOSIS — K760 Fatty (change of) liver, not elsewhere classified: Secondary | ICD-10-CM | POA: Diagnosis not present

## 2023-03-23 DIAGNOSIS — I5033 Acute on chronic diastolic (congestive) heart failure: Secondary | ICD-10-CM | POA: Diagnosis not present

## 2023-03-23 DIAGNOSIS — M797 Fibromyalgia: Secondary | ICD-10-CM | POA: Diagnosis not present

## 2023-03-23 DIAGNOSIS — M4802 Spinal stenosis, cervical region: Secondary | ICD-10-CM | POA: Diagnosis not present

## 2023-03-23 DIAGNOSIS — J45901 Unspecified asthma with (acute) exacerbation: Secondary | ICD-10-CM | POA: Diagnosis not present

## 2023-03-23 DIAGNOSIS — E559 Vitamin D deficiency, unspecified: Secondary | ICD-10-CM | POA: Diagnosis not present

## 2023-03-23 DIAGNOSIS — Z9981 Dependence on supplemental oxygen: Secondary | ICD-10-CM | POA: Diagnosis not present

## 2023-03-23 DIAGNOSIS — E785 Hyperlipidemia, unspecified: Secondary | ICD-10-CM | POA: Diagnosis not present

## 2023-03-23 DIAGNOSIS — E876 Hypokalemia: Secondary | ICD-10-CM | POA: Diagnosis not present

## 2023-03-23 DIAGNOSIS — G894 Chronic pain syndrome: Secondary | ICD-10-CM | POA: Diagnosis not present

## 2023-03-23 DIAGNOSIS — M300 Polyarteritis nodosa: Secondary | ICD-10-CM | POA: Diagnosis not present

## 2023-03-23 DIAGNOSIS — G629 Polyneuropathy, unspecified: Secondary | ICD-10-CM | POA: Diagnosis not present

## 2023-03-23 DIAGNOSIS — L400 Psoriasis vulgaris: Secondary | ICD-10-CM | POA: Diagnosis not present

## 2023-03-23 DIAGNOSIS — I110 Hypertensive heart disease with heart failure: Secondary | ICD-10-CM | POA: Diagnosis not present

## 2023-03-23 DIAGNOSIS — G9529 Other cord compression: Secondary | ICD-10-CM | POA: Diagnosis not present

## 2023-03-23 DIAGNOSIS — I872 Venous insufficiency (chronic) (peripheral): Secondary | ICD-10-CM | POA: Diagnosis not present

## 2023-03-23 DIAGNOSIS — E538 Deficiency of other specified B group vitamins: Secondary | ICD-10-CM | POA: Diagnosis not present

## 2023-03-23 DIAGNOSIS — G43909 Migraine, unspecified, not intractable, without status migrainosus: Secondary | ICD-10-CM | POA: Diagnosis not present

## 2023-03-23 DIAGNOSIS — G4733 Obstructive sleep apnea (adult) (pediatric): Secondary | ICD-10-CM | POA: Diagnosis not present

## 2023-03-24 DIAGNOSIS — E785 Hyperlipidemia, unspecified: Secondary | ICD-10-CM | POA: Diagnosis not present

## 2023-03-24 DIAGNOSIS — M797 Fibromyalgia: Secondary | ICD-10-CM | POA: Diagnosis not present

## 2023-03-24 DIAGNOSIS — M300 Polyarteritis nodosa: Secondary | ICD-10-CM | POA: Diagnosis not present

## 2023-03-24 DIAGNOSIS — K760 Fatty (change of) liver, not elsewhere classified: Secondary | ICD-10-CM | POA: Diagnosis not present

## 2023-03-24 DIAGNOSIS — L400 Psoriasis vulgaris: Secondary | ICD-10-CM | POA: Diagnosis not present

## 2023-03-24 DIAGNOSIS — I110 Hypertensive heart disease with heart failure: Secondary | ICD-10-CM | POA: Diagnosis not present

## 2023-03-24 DIAGNOSIS — I5033 Acute on chronic diastolic (congestive) heart failure: Secondary | ICD-10-CM | POA: Diagnosis not present

## 2023-03-24 DIAGNOSIS — G43909 Migraine, unspecified, not intractable, without status migrainosus: Secondary | ICD-10-CM | POA: Diagnosis not present

## 2023-03-24 DIAGNOSIS — Z9981 Dependence on supplemental oxygen: Secondary | ICD-10-CM | POA: Diagnosis not present

## 2023-03-24 DIAGNOSIS — K219 Gastro-esophageal reflux disease without esophagitis: Secondary | ICD-10-CM | POA: Diagnosis not present

## 2023-03-24 DIAGNOSIS — G629 Polyneuropathy, unspecified: Secondary | ICD-10-CM | POA: Diagnosis not present

## 2023-03-24 DIAGNOSIS — E538 Deficiency of other specified B group vitamins: Secondary | ICD-10-CM | POA: Diagnosis not present

## 2023-03-24 DIAGNOSIS — E559 Vitamin D deficiency, unspecified: Secondary | ICD-10-CM | POA: Diagnosis not present

## 2023-03-24 DIAGNOSIS — G9529 Other cord compression: Secondary | ICD-10-CM | POA: Diagnosis not present

## 2023-03-24 DIAGNOSIS — I872 Venous insufficiency (chronic) (peripheral): Secondary | ICD-10-CM | POA: Diagnosis not present

## 2023-03-24 DIAGNOSIS — J45901 Unspecified asthma with (acute) exacerbation: Secondary | ICD-10-CM | POA: Diagnosis not present

## 2023-03-24 DIAGNOSIS — G4733 Obstructive sleep apnea (adult) (pediatric): Secondary | ICD-10-CM | POA: Diagnosis not present

## 2023-03-24 DIAGNOSIS — G894 Chronic pain syndrome: Secondary | ICD-10-CM | POA: Diagnosis not present

## 2023-03-24 DIAGNOSIS — J9601 Acute respiratory failure with hypoxia: Secondary | ICD-10-CM | POA: Diagnosis not present

## 2023-03-24 DIAGNOSIS — M4802 Spinal stenosis, cervical region: Secondary | ICD-10-CM | POA: Diagnosis not present

## 2023-03-24 DIAGNOSIS — E876 Hypokalemia: Secondary | ICD-10-CM | POA: Diagnosis not present

## 2023-03-27 DIAGNOSIS — J45901 Unspecified asthma with (acute) exacerbation: Secondary | ICD-10-CM | POA: Diagnosis not present

## 2023-03-27 DIAGNOSIS — G43909 Migraine, unspecified, not intractable, without status migrainosus: Secondary | ICD-10-CM | POA: Diagnosis not present

## 2023-03-27 DIAGNOSIS — L400 Psoriasis vulgaris: Secondary | ICD-10-CM | POA: Diagnosis not present

## 2023-03-27 DIAGNOSIS — G9529 Other cord compression: Secondary | ICD-10-CM | POA: Diagnosis not present

## 2023-03-27 DIAGNOSIS — G4733 Obstructive sleep apnea (adult) (pediatric): Secondary | ICD-10-CM | POA: Diagnosis not present

## 2023-03-27 DIAGNOSIS — M300 Polyarteritis nodosa: Secondary | ICD-10-CM | POA: Diagnosis not present

## 2023-03-27 DIAGNOSIS — I110 Hypertensive heart disease with heart failure: Secondary | ICD-10-CM | POA: Diagnosis not present

## 2023-03-27 DIAGNOSIS — G894 Chronic pain syndrome: Secondary | ICD-10-CM | POA: Diagnosis not present

## 2023-03-27 DIAGNOSIS — E538 Deficiency of other specified B group vitamins: Secondary | ICD-10-CM | POA: Diagnosis not present

## 2023-03-27 DIAGNOSIS — E559 Vitamin D deficiency, unspecified: Secondary | ICD-10-CM | POA: Diagnosis not present

## 2023-03-27 DIAGNOSIS — I872 Venous insufficiency (chronic) (peripheral): Secondary | ICD-10-CM | POA: Diagnosis not present

## 2023-03-27 DIAGNOSIS — E876 Hypokalemia: Secondary | ICD-10-CM | POA: Diagnosis not present

## 2023-03-27 DIAGNOSIS — G629 Polyneuropathy, unspecified: Secondary | ICD-10-CM | POA: Diagnosis not present

## 2023-03-27 DIAGNOSIS — I5033 Acute on chronic diastolic (congestive) heart failure: Secondary | ICD-10-CM | POA: Diagnosis not present

## 2023-03-27 DIAGNOSIS — Z9981 Dependence on supplemental oxygen: Secondary | ICD-10-CM | POA: Diagnosis not present

## 2023-03-27 DIAGNOSIS — J9601 Acute respiratory failure with hypoxia: Secondary | ICD-10-CM | POA: Diagnosis not present

## 2023-03-27 DIAGNOSIS — K219 Gastro-esophageal reflux disease without esophagitis: Secondary | ICD-10-CM | POA: Diagnosis not present

## 2023-03-27 DIAGNOSIS — M797 Fibromyalgia: Secondary | ICD-10-CM | POA: Diagnosis not present

## 2023-03-27 DIAGNOSIS — M4802 Spinal stenosis, cervical region: Secondary | ICD-10-CM | POA: Diagnosis not present

## 2023-03-27 DIAGNOSIS — K760 Fatty (change of) liver, not elsewhere classified: Secondary | ICD-10-CM | POA: Diagnosis not present

## 2023-03-27 DIAGNOSIS — E785 Hyperlipidemia, unspecified: Secondary | ICD-10-CM | POA: Diagnosis not present

## 2023-03-29 DIAGNOSIS — I872 Venous insufficiency (chronic) (peripheral): Secondary | ICD-10-CM | POA: Diagnosis not present

## 2023-03-29 DIAGNOSIS — J45901 Unspecified asthma with (acute) exacerbation: Secondary | ICD-10-CM | POA: Diagnosis not present

## 2023-03-29 DIAGNOSIS — J9601 Acute respiratory failure with hypoxia: Secondary | ICD-10-CM | POA: Diagnosis not present

## 2023-03-29 DIAGNOSIS — M300 Polyarteritis nodosa: Secondary | ICD-10-CM | POA: Diagnosis not present

## 2023-03-29 DIAGNOSIS — K760 Fatty (change of) liver, not elsewhere classified: Secondary | ICD-10-CM | POA: Diagnosis not present

## 2023-03-29 DIAGNOSIS — E876 Hypokalemia: Secondary | ICD-10-CM | POA: Diagnosis not present

## 2023-03-29 DIAGNOSIS — I5033 Acute on chronic diastolic (congestive) heart failure: Secondary | ICD-10-CM | POA: Diagnosis not present

## 2023-03-29 DIAGNOSIS — E559 Vitamin D deficiency, unspecified: Secondary | ICD-10-CM | POA: Diagnosis not present

## 2023-03-29 DIAGNOSIS — G43909 Migraine, unspecified, not intractable, without status migrainosus: Secondary | ICD-10-CM | POA: Diagnosis not present

## 2023-03-29 DIAGNOSIS — M797 Fibromyalgia: Secondary | ICD-10-CM | POA: Diagnosis not present

## 2023-03-29 DIAGNOSIS — K219 Gastro-esophageal reflux disease without esophagitis: Secondary | ICD-10-CM | POA: Diagnosis not present

## 2023-03-29 DIAGNOSIS — G4733 Obstructive sleep apnea (adult) (pediatric): Secondary | ICD-10-CM | POA: Diagnosis not present

## 2023-03-29 DIAGNOSIS — Z9981 Dependence on supplemental oxygen: Secondary | ICD-10-CM | POA: Diagnosis not present

## 2023-03-29 DIAGNOSIS — I110 Hypertensive heart disease with heart failure: Secondary | ICD-10-CM | POA: Diagnosis not present

## 2023-03-29 DIAGNOSIS — G629 Polyneuropathy, unspecified: Secondary | ICD-10-CM | POA: Diagnosis not present

## 2023-03-29 DIAGNOSIS — E538 Deficiency of other specified B group vitamins: Secondary | ICD-10-CM | POA: Diagnosis not present

## 2023-03-29 DIAGNOSIS — G894 Chronic pain syndrome: Secondary | ICD-10-CM | POA: Diagnosis not present

## 2023-03-29 DIAGNOSIS — E785 Hyperlipidemia, unspecified: Secondary | ICD-10-CM | POA: Diagnosis not present

## 2023-03-29 DIAGNOSIS — M4802 Spinal stenosis, cervical region: Secondary | ICD-10-CM | POA: Diagnosis not present

## 2023-03-29 DIAGNOSIS — L400 Psoriasis vulgaris: Secondary | ICD-10-CM | POA: Diagnosis not present

## 2023-03-29 DIAGNOSIS — G9529 Other cord compression: Secondary | ICD-10-CM | POA: Diagnosis not present

## 2023-03-30 ENCOUNTER — Telehealth: Payer: Self-pay | Admitting: Family Medicine

## 2023-03-30 ENCOUNTER — Encounter: Payer: Self-pay | Admitting: Family Medicine

## 2023-03-30 NOTE — Telephone Encounter (Signed)
FAXED

## 2023-03-30 NOTE — Telephone Encounter (Signed)
Placed in folder at nurse station

## 2023-03-30 NOTE — Telephone Encounter (Signed)
 Forms completed and returned to [place]

## 2023-03-30 NOTE — Telephone Encounter (Signed)
Type of form received: Physician Orders  Additional comments: [facility]  Received by: Wilford Sports  Form should be Faxed/mailed to: (address/ fax #) [phone]  Is patient requesting call for pickup: No  Form placed:  Provider [facility]  Attach charge sheet.  Provider will determine charge.  Individual made aware of 3-5 business day turn around No? Received as a fax

## 2023-03-31 DIAGNOSIS — K760 Fatty (change of) liver, not elsewhere classified: Secondary | ICD-10-CM | POA: Diagnosis not present

## 2023-03-31 DIAGNOSIS — I872 Venous insufficiency (chronic) (peripheral): Secondary | ICD-10-CM | POA: Diagnosis not present

## 2023-03-31 DIAGNOSIS — J9601 Acute respiratory failure with hypoxia: Secondary | ICD-10-CM | POA: Diagnosis not present

## 2023-03-31 DIAGNOSIS — M4802 Spinal stenosis, cervical region: Secondary | ICD-10-CM | POA: Diagnosis not present

## 2023-03-31 DIAGNOSIS — K219 Gastro-esophageal reflux disease without esophagitis: Secondary | ICD-10-CM | POA: Diagnosis not present

## 2023-03-31 DIAGNOSIS — G4733 Obstructive sleep apnea (adult) (pediatric): Secondary | ICD-10-CM | POA: Diagnosis not present

## 2023-03-31 DIAGNOSIS — G43909 Migraine, unspecified, not intractable, without status migrainosus: Secondary | ICD-10-CM | POA: Diagnosis not present

## 2023-03-31 DIAGNOSIS — Z9981 Dependence on supplemental oxygen: Secondary | ICD-10-CM | POA: Diagnosis not present

## 2023-03-31 DIAGNOSIS — M797 Fibromyalgia: Secondary | ICD-10-CM | POA: Diagnosis not present

## 2023-03-31 DIAGNOSIS — J45901 Unspecified asthma with (acute) exacerbation: Secondary | ICD-10-CM | POA: Diagnosis not present

## 2023-03-31 DIAGNOSIS — E876 Hypokalemia: Secondary | ICD-10-CM | POA: Diagnosis not present

## 2023-03-31 DIAGNOSIS — G629 Polyneuropathy, unspecified: Secondary | ICD-10-CM | POA: Diagnosis not present

## 2023-03-31 DIAGNOSIS — G894 Chronic pain syndrome: Secondary | ICD-10-CM | POA: Diagnosis not present

## 2023-03-31 DIAGNOSIS — L400 Psoriasis vulgaris: Secondary | ICD-10-CM | POA: Diagnosis not present

## 2023-03-31 DIAGNOSIS — E785 Hyperlipidemia, unspecified: Secondary | ICD-10-CM | POA: Diagnosis not present

## 2023-03-31 DIAGNOSIS — M300 Polyarteritis nodosa: Secondary | ICD-10-CM | POA: Diagnosis not present

## 2023-03-31 DIAGNOSIS — I110 Hypertensive heart disease with heart failure: Secondary | ICD-10-CM | POA: Diagnosis not present

## 2023-03-31 DIAGNOSIS — G9529 Other cord compression: Secondary | ICD-10-CM | POA: Diagnosis not present

## 2023-03-31 DIAGNOSIS — E559 Vitamin D deficiency, unspecified: Secondary | ICD-10-CM | POA: Diagnosis not present

## 2023-03-31 DIAGNOSIS — I5033 Acute on chronic diastolic (congestive) heart failure: Secondary | ICD-10-CM | POA: Diagnosis not present

## 2023-03-31 DIAGNOSIS — E538 Deficiency of other specified B group vitamins: Secondary | ICD-10-CM | POA: Diagnosis not present

## 2023-04-03 ENCOUNTER — Other Ambulatory Visit: Payer: Self-pay | Admitting: Family Medicine

## 2023-04-03 ENCOUNTER — Telehealth: Payer: Self-pay | Admitting: Family Medicine

## 2023-04-03 DIAGNOSIS — Z7951 Long term (current) use of inhaled steroids: Secondary | ICD-10-CM | POA: Diagnosis not present

## 2023-04-03 DIAGNOSIS — L400 Psoriasis vulgaris: Secondary | ICD-10-CM | POA: Diagnosis not present

## 2023-04-03 DIAGNOSIS — Z87440 Personal history of urinary (tract) infections: Secondary | ICD-10-CM | POA: Diagnosis not present

## 2023-04-03 DIAGNOSIS — J9601 Acute respiratory failure with hypoxia: Secondary | ICD-10-CM | POA: Diagnosis not present

## 2023-04-03 DIAGNOSIS — I872 Venous insufficiency (chronic) (peripheral): Secondary | ICD-10-CM | POA: Diagnosis not present

## 2023-04-03 DIAGNOSIS — J45901 Unspecified asthma with (acute) exacerbation: Secondary | ICD-10-CM | POA: Diagnosis not present

## 2023-04-03 DIAGNOSIS — G43909 Migraine, unspecified, not intractable, without status migrainosus: Secondary | ICD-10-CM | POA: Diagnosis not present

## 2023-04-03 DIAGNOSIS — E538 Deficiency of other specified B group vitamins: Secondary | ICD-10-CM | POA: Diagnosis not present

## 2023-04-03 DIAGNOSIS — I110 Hypertensive heart disease with heart failure: Secondary | ICD-10-CM | POA: Diagnosis not present

## 2023-04-03 DIAGNOSIS — K760 Fatty (change of) liver, not elsewhere classified: Secondary | ICD-10-CM | POA: Diagnosis not present

## 2023-04-03 DIAGNOSIS — M797 Fibromyalgia: Secondary | ICD-10-CM | POA: Diagnosis not present

## 2023-04-03 DIAGNOSIS — G629 Polyneuropathy, unspecified: Secondary | ICD-10-CM | POA: Diagnosis not present

## 2023-04-03 DIAGNOSIS — G4733 Obstructive sleep apnea (adult) (pediatric): Secondary | ICD-10-CM | POA: Diagnosis not present

## 2023-04-03 DIAGNOSIS — G9589 Other specified diseases of spinal cord: Secondary | ICD-10-CM

## 2023-04-03 DIAGNOSIS — G894 Chronic pain syndrome: Secondary | ICD-10-CM | POA: Diagnosis not present

## 2023-04-03 DIAGNOSIS — I5033 Acute on chronic diastolic (congestive) heart failure: Secondary | ICD-10-CM | POA: Diagnosis not present

## 2023-04-03 DIAGNOSIS — M4802 Spinal stenosis, cervical region: Secondary | ICD-10-CM | POA: Diagnosis not present

## 2023-04-03 DIAGNOSIS — K219 Gastro-esophageal reflux disease without esophagitis: Secondary | ICD-10-CM | POA: Diagnosis not present

## 2023-04-03 DIAGNOSIS — M300 Polyarteritis nodosa: Secondary | ICD-10-CM | POA: Diagnosis not present

## 2023-04-03 DIAGNOSIS — G9529 Other cord compression: Secondary | ICD-10-CM | POA: Diagnosis not present

## 2023-04-03 DIAGNOSIS — E559 Vitamin D deficiency, unspecified: Secondary | ICD-10-CM | POA: Diagnosis not present

## 2023-04-03 DIAGNOSIS — E785 Hyperlipidemia, unspecified: Secondary | ICD-10-CM | POA: Diagnosis not present

## 2023-04-03 NOTE — Telephone Encounter (Signed)
Okay to provide verbal orders?

## 2023-04-03 NOTE — Telephone Encounter (Signed)
Home Health Verbal Orders  Agency:  [facility]  Caller: (Contact and title) Physical Therapist, Cecila Call back #: [phone] Fax #:    Requesting OT/ PT/ Skilled nursing/ Social Work/ Speech:  PT - Patient canceled 11/16 appt due to being sick. Physical Therapist wants to reschedule that appt to this week.   Reason for Request:  Verbal orders for continued PT  Frequency:  Phyical therapy for twice a week for 8 weeks   HH needs F2F w/in last 30 days

## 2023-04-03 NOTE — Telephone Encounter (Signed)
Ok for verbal orders.  She has a virtual visit within that time frame I believe

## 2023-04-03 NOTE — Telephone Encounter (Signed)
Verbal order have been given

## 2023-04-04 ENCOUNTER — Telehealth: Payer: Self-pay

## 2023-04-04 ENCOUNTER — Telehealth: Payer: Self-pay | Admitting: Family Medicine

## 2023-04-04 DIAGNOSIS — E785 Hyperlipidemia, unspecified: Secondary | ICD-10-CM | POA: Diagnosis not present

## 2023-04-04 DIAGNOSIS — G4733 Obstructive sleep apnea (adult) (pediatric): Secondary | ICD-10-CM | POA: Diagnosis not present

## 2023-04-04 DIAGNOSIS — E559 Vitamin D deficiency, unspecified: Secondary | ICD-10-CM | POA: Diagnosis not present

## 2023-04-04 DIAGNOSIS — K219 Gastro-esophageal reflux disease without esophagitis: Secondary | ICD-10-CM | POA: Diagnosis not present

## 2023-04-04 DIAGNOSIS — E538 Deficiency of other specified B group vitamins: Secondary | ICD-10-CM | POA: Diagnosis not present

## 2023-04-04 DIAGNOSIS — Z87440 Personal history of urinary (tract) infections: Secondary | ICD-10-CM | POA: Diagnosis not present

## 2023-04-04 DIAGNOSIS — J45901 Unspecified asthma with (acute) exacerbation: Secondary | ICD-10-CM | POA: Diagnosis not present

## 2023-04-04 DIAGNOSIS — J9601 Acute respiratory failure with hypoxia: Secondary | ICD-10-CM | POA: Diagnosis not present

## 2023-04-04 DIAGNOSIS — I110 Hypertensive heart disease with heart failure: Secondary | ICD-10-CM | POA: Diagnosis not present

## 2023-04-04 DIAGNOSIS — I5033 Acute on chronic diastolic (congestive) heart failure: Secondary | ICD-10-CM | POA: Diagnosis not present

## 2023-04-04 DIAGNOSIS — G43909 Migraine, unspecified, not intractable, without status migrainosus: Secondary | ICD-10-CM | POA: Diagnosis not present

## 2023-04-04 DIAGNOSIS — I872 Venous insufficiency (chronic) (peripheral): Secondary | ICD-10-CM | POA: Diagnosis not present

## 2023-04-04 DIAGNOSIS — L400 Psoriasis vulgaris: Secondary | ICD-10-CM | POA: Diagnosis not present

## 2023-04-04 DIAGNOSIS — K760 Fatty (change of) liver, not elsewhere classified: Secondary | ICD-10-CM | POA: Diagnosis not present

## 2023-04-04 DIAGNOSIS — M4802 Spinal stenosis, cervical region: Secondary | ICD-10-CM | POA: Diagnosis not present

## 2023-04-04 DIAGNOSIS — G629 Polyneuropathy, unspecified: Secondary | ICD-10-CM | POA: Diagnosis not present

## 2023-04-04 DIAGNOSIS — G894 Chronic pain syndrome: Secondary | ICD-10-CM | POA: Diagnosis not present

## 2023-04-04 DIAGNOSIS — M300 Polyarteritis nodosa: Secondary | ICD-10-CM | POA: Diagnosis not present

## 2023-04-04 DIAGNOSIS — M797 Fibromyalgia: Secondary | ICD-10-CM | POA: Diagnosis not present

## 2023-04-04 DIAGNOSIS — Z7951 Long term (current) use of inhaled steroids: Secondary | ICD-10-CM | POA: Diagnosis not present

## 2023-04-04 DIAGNOSIS — G9529 Other cord compression: Secondary | ICD-10-CM | POA: Diagnosis not present

## 2023-04-04 NOTE — Telephone Encounter (Addendum)
Home Health for Review  Is this a Review Only  HH Agency:[facility]      Has charge sheet been attached? No   Where has form been placed:   [facility]

## 2023-04-04 NOTE — Telephone Encounter (Signed)
Reviewed. No action needed.

## 2023-04-04 NOTE — Telephone Encounter (Signed)
Noted. Will await results.

## 2023-04-04 NOTE — Telephone Encounter (Signed)
Home health [facility] called and stated she is collecting a urine due to burning with urination since Friday.   She will fax results to [place] here

## 2023-04-04 NOTE — Telephone Encounter (Signed)
Placed in your sign folder for completion

## 2023-04-04 NOTE — Telephone Encounter (Signed)
Last seen 03/22/2023 Last filled 03/06/2023  Change in dose on Alprazolam 0.5 mg

## 2023-04-05 ENCOUNTER — Ambulatory Visit (HOSPITAL_BASED_OUTPATIENT_CLINIC_OR_DEPARTMENT_OTHER): Payer: 59 | Admitting: Pulmonary Disease

## 2023-04-05 NOTE — Telephone Encounter (Signed)
NOTED

## 2023-04-06 ENCOUNTER — Telehealth: Payer: Self-pay | Admitting: Family Medicine

## 2023-04-06 ENCOUNTER — Telehealth: Payer: 59 | Admitting: Family Medicine

## 2023-04-06 ENCOUNTER — Encounter: Payer: Self-pay | Admitting: Family Medicine

## 2023-04-06 DIAGNOSIS — I5033 Acute on chronic diastolic (congestive) heart failure: Secondary | ICD-10-CM

## 2023-04-06 DIAGNOSIS — M300 Polyarteritis nodosa: Secondary | ICD-10-CM

## 2023-04-06 DIAGNOSIS — I110 Hypertensive heart disease with heart failure: Secondary | ICD-10-CM

## 2023-04-06 DIAGNOSIS — M4802 Spinal stenosis, cervical region: Secondary | ICD-10-CM

## 2023-04-06 DIAGNOSIS — G9589 Other specified diseases of spinal cord: Secondary | ICD-10-CM | POA: Diagnosis not present

## 2023-04-06 DIAGNOSIS — J45901 Unspecified asthma with (acute) exacerbation: Secondary | ICD-10-CM

## 2023-04-06 DIAGNOSIS — G629 Polyneuropathy, unspecified: Secondary | ICD-10-CM

## 2023-04-06 DIAGNOSIS — R339 Retention of urine, unspecified: Secondary | ICD-10-CM | POA: Diagnosis not present

## 2023-04-06 DIAGNOSIS — G9529 Other cord compression: Secondary | ICD-10-CM

## 2023-04-06 DIAGNOSIS — E538 Deficiency of other specified B group vitamins: Secondary | ICD-10-CM

## 2023-04-06 DIAGNOSIS — M797 Fibromyalgia: Secondary | ICD-10-CM

## 2023-04-06 DIAGNOSIS — I872 Venous insufficiency (chronic) (peripheral): Secondary | ICD-10-CM

## 2023-04-06 DIAGNOSIS — J9601 Acute respiratory failure with hypoxia: Secondary | ICD-10-CM

## 2023-04-06 NOTE — Telephone Encounter (Signed)
Faxed

## 2023-04-06 NOTE — Telephone Encounter (Signed)
Placed in folder at nurse station

## 2023-04-06 NOTE — Progress Notes (Signed)
Virtual Visit via Video   I connected with patient on 04/06/23 at  1:40 PM EST by a video enabled telemedicine application and verified that I am speaking with the correct person using two identifiers.  Location patient: Home Location provider: [facility] Persons participating in the virtual visit: Patient, Provider, CMA Archie Patten H)  I discussed the limitations of evaluation and management by telemedicine and the availability of in person appointments. The patient expressed understanding and agreed to proceed.  Subjective:   HPI:   'discuss my heart'- pt was given the dx of 'acute on chronic heart failure w/ preserved ejection fraction' after her hospitalization in July.  This is confusing to her b/c she has never seen cardiology and no one talked to her about this dx- she just saw it on her D/C paperwork.  Myelomalacia of cervical cord- pt remains mostly bed bound.  She had to cancel her pulmonary appt yesterday bc her old scooter is not functional and she never got the new scooter that we spent a full calendar year working on.  ROS:   See pertinent positives and negatives per HPI.  Patient Active Problem List   Diagnosis Date Noted   Asthma, chronic, unspecified asthma severity, with acute exacerbation 01/12/2023   Hypomagnesemia 01/12/2023   Bedbound 12/14/2022   Acute on chronic heart failure with preserved ejection fraction (HFpEF) (HCC) 11/23/2022   Pressure injury of skin 11/23/2022   Nocturnal hypoxemia 06/21/2022   Elevated LFTs 05/06/2022   Obesity hypoventilation syndrome (HCC) 05/06/2022   Acute respiratory failure with hypoxia (HCC) 04/22/2022   Hypokalemia 04/22/2022   Leg cramps 01/12/2022   Fever of unknown origin 08/12/2021   Sacral pressure sore 07/06/2021   Vitamin B6 deficiency 06/08/2021   Myelomalacia of cervical cord (HCC) 06/06/2021   Compressive cervical cord myelomalacia (HCC) 06/05/2021   B12 deficiency 06/05/2021   Folate  deficiency 06/05/2021   Vitamin D deficiency 06/05/2021   Leg weakness, bilateral 06/04/2021   Cervical atypia 01/06/2021   Steatosis of liver 01/06/2021   Hyponatremia 07/27/2019   Urinary urgency 04/04/2018   Chronic narcotic use 09/07/2016   Acute blood loss anemia 01/14/2016   Multiple gastric ulcers    PAN (polyarteritis nodosa) (HCC) 11/24/2015   GERD (gastroesophageal reflux disease) 01/08/2014   Routine general medical examination at a health care facility 04/23/2013   Bariatric surgery status 10/31/2012   S/P skin biopsy 10/31/2012   Psoriasis 01/09/2012   DDD (degenerative disc disease), lumbar 11/30/2011   Migraine headache    [name] (obstructive sleep apnea) 11/16/2010   HYPERGLYCEMIA, FASTING 10/08/2009   CONTRACTURE OF TENDON 08/17/2009   PARESTHESIA, HANDS 12/23/2008   Leg pain, right 11/05/2008   ADVERSE DRUG REACTION 04/03/2008   PERIPHERAL EDEMA 03/26/2008   Obesity, morbid, BMI 50 or higher (HCC) 01/03/2007   Cellulitis 01/03/2007   Hyperlipidemia 09/22/2006   Fibromyalgia 09/20/2006   Anxiety and depression 06/28/2006   Essential hypertension 06/28/2006    Social History   Tobacco Use   Smoking status: Never   Smokeless tobacco: Never  Substance Use Topics   Alcohol use: No    Current Outpatient Medications:    ALPRAZolam (XANAX) 1 MG tablet, Take 1 tablet (1 mg total) by mouth 2 (two) times daily., Disp: 60 tablet, Rfl: 3   ARIPiprazole (ABILIFY) 15 MG tablet, TAKE 1 TABLET BY MOUTH EVERY DAY, Disp: 90 tablet, Rfl: 1   Armodafinil 250 MG tablet, Take 1 tablet (250 mg total) by mouth in the morning  and at bedtime., Disp: 60 tablet, Rfl: 3   Biotin 10 MG TABS, Take 1 tablet (10 mg total) by mouth in the morning., Disp: 90 tablet, Rfl: 1   buPROPion (WELLBUTRIN XL) 300 MG 24 hr tablet, TAKE 1 TABLET BY MOUTH EVERY DAY, Disp: 30 tablet, Rfl: 2   calcium citrate (CALCITRATE - DOSED IN MG ELEMENTAL CALCIUM) 950 (200 Ca) MG tablet, Take 1 tablet (200 mg of  elemental calcium total) by mouth daily., Disp: 90 tablet, Rfl: 1   clonazePAM (KLONOPIN) 1 MG tablet, TAKE 1 TABLET BY MOUTH AT BEDTIME, Disp: 30 tablet, Rfl: 3   cyanocobalamin (VITAMIN B12) 1000 MCG tablet, TAKE 1 TABLET BY MOUTH EVERY DAY, Disp: 30 tablet, Rfl: 2   cyclobenzaprine (FLEXERIL) 10 MG tablet, TAKE 2 TABLETS BY MOUTH 2 TIMES DAILY, Disp: 120 tablet, Rfl: 2   diclofenac Sodium (VOLTAREN) 1 % GEL, Apply 4 g topically 4 (four) times daily., Disp: 1 g, Rfl: 1   DULoxetine (CYMBALTA) 60 MG capsule, Take 2 capsules (120 mg total) by mouth daily. (Patient taking differently: Take 60 mg by mouth 2 (two) times daily. Pt reported taking two capsules(120mg ) once daily), Disp: 240 capsule, Rfl: 2   FEROSUL 325 (65 Fe) MG tablet, TAKE 1 TABLET BY MOUTH EVERY MORNING WITH BREAKFAST, Disp: 30 tablet, Rfl: 2   fluticasone-salmeterol (ADVAIR) 250-50 MCG/ACT AEPB, Inhale 1 puff into the lungs every 12 (twelve) hours., Disp: 60 each, Rfl: 5   folic acid (FOLVITE) 1 MG tablet, TAKE 1 TABLET BY MOUTH ONCE DAILY, Disp: 30 tablet, Rfl: 2   furosemide (LASIX) 40 MG tablet, Take 1 tablet by mouth daily. May take an additional tablet by mouth IF weight gain of 5 lbs and CALL MD for further instructions., Disp: 90 tablet, Rfl: 1   gabapentin (NEURONTIN) 600 MG tablet, Take 1 tablet (600 mg total) by mouth 2 (two) times daily as needed. Pt takes daily - can take up to twice daily, Disp: 60 tablet, Rfl: 1   HYDROcodone-acetaminophen (NORCO) 10-325 MG tablet, TAKE 1 TABLET BY MOUTH EVERY 8 HOURS AS NEEDED, Disp: 90 tablet, Rfl: 0   ipratropium-albuterol (DUONEB) 0.5-2.5 (3) MG/3ML SOLN, Take 3 mLs by nebulization every 6 (six) hours as needed (wheezing/shortness of breath)., Disp: 360 mL, Rfl: 0   nebivolol (BYSTOLIC) 10 MG tablet, TAKE 1 TABLET BY MOUTH ONCE DAILY, Disp: 30 tablet, Rfl: 2   nystatin (MYCOSTATIN/NYSTOP) powder, APPLY 1 APPLICATION TOPICALLY 3 TIMES DAILY (Patient taking differently: Apply 1  Application topically 3 (three) times daily as needed (yeast infection).), Disp: 60 g, Rfl: 3   Oxcarbazepine (TRILEPTAL) 300 MG tablet, Take 300 mg by mouth 2 (two) times daily., Disp: , Rfl:    pantoprazole (PROTONIX) 40 MG tablet, TAKE 1 TABLET BY MOUTH 2 TIMES DAILY, Disp: 60 tablet, Rfl: 2   phentermine 37.5 MG capsule, Take 1 capsule (37.5 mg total) by mouth every morning., Disp: 90 capsule, Rfl: 0   promethazine (PHENERGAN) 25 MG tablet, TAKE 1 TABLET BY MOUTH EVERY 6 HOURS AS NEEDED FOR NAUSEA AND VOMITING, Disp: 45 tablet, Rfl: 3   pyridOXINE (VITAMIN B6) 100 MG tablet, TAKE 1 TABLET BY MOUTH EVERY DAY, Disp: 30 tablet, Rfl: 2   Thiamine HCl (B-1) 100 MG TABS, TAKE 1 TABLET BY MOUTH EVERY DAY, Disp: 30 tablet, Rfl: 2   potassium chloride (KLOR-CON) 20 MEQ packet, Take 1 packet by mouth daily. Take with lasix., Disp: 30 packet, Rfl: 1  Allergies  Allergen Reactions  Erythromycin Anaphylaxis   Niacin Anaphylaxis, Shortness Of Breath and Swelling    Swelling and "problems breathing"   Sulfamethoxazole-Trimethoprim Anaphylaxis, Swelling, Rash and Other (See Comments)    Throat closed and the eyes became swollen   Aspirin Hives   Bactrim Hives   Benzoin Compound Rash   Cephalexin Hives    Tolerated ceftriaxone and cefepime 07/2019   Iohexol Hives    Pt treated with PO benedryl   Lisinopril Hives   Naproxen Hives   Pnu-Imune [Pneumococcal Polysaccharide Vaccine] Rash   Sulfonamide Derivatives Rash    Objective:   There were no vitals taken for this visit. AAOx3, NAD Lying in bed w/ O2 via [place] Morbidly obese NCAT, EOMI No obvious CN deficits Coloring WNL Pt is able to speak clearly, coherently without shortness of breath or increased work of breathing.  Thought process is linear.  Mood is appropriate.   Assessment and Plan:   HFpEF- new.  Pt is understandably upset and confused regarding this dx b/c she never saw Cardiology and was told her ECHO was normal in July.  She  has multiple co-morbidities but would like to know if this is a true dx b/c none of her medications were adjusted and this was not explained to her.  Will place a referral to Heart Failure Team (pt's father saw Dr Gala Romney).  Pt expressed understanding and is in agreement w/ plan.   Myelomalacia of cervical cord- ongoing.  Pt is mostly bedbound bc she does not have a working scooter to help her move around.  We literally spent the last calendar year filling out paperwork, re-doing paperwork, speaking w/ the company, re-doing paperwork and she says today that she never got the scooter.  We will reach out to the company to see what the problem is as pt is wondering if she needs to start over using a different company- but that will just take longer.  Call has been made and voice mail left at time of closing this note.   Neena Rhymes, MD 04/06/2023

## 2023-04-06 NOTE — Telephone Encounter (Signed)
Who is Faxing: [facility]   Callback number:[phone]  What type of DME (Durable Medical Equipment) would you like your provider to order:    Who and where does the order need to be sent:  [facility] Fax [phone]  Comments:    Last visit with PCP (>3 months requires an appointment for insurance to cover the cost.  Please schedule patient for visit to discuss medical necessity for DME)   Charge Sheet Attached  Placed in [facility]

## 2023-04-06 NOTE — Telephone Encounter (Signed)
Type of form received: Physician Orders   Additional comments: 2 sets (paper clipped together)  Received by: [facility]  Form should be Faxed/mailed to: (address/ fax #) [phone]  Is patient requesting call for pickup: No  Form placed:  Labeled & placed in provider bin  Attach charge sheet.  Provider will determine charge.  Individual made aware of 3-5 business day turn around No?

## 2023-04-06 NOTE — Telephone Encounter (Signed)
Form completed and returned to [place]

## 2023-04-06 NOTE — Telephone Encounter (Signed)
Home health forms completed and returned to [place]

## 2023-04-06 NOTE — Telephone Encounter (Signed)
Patient had an appt with Dr. Beverely Low today and she mentioned how she still hasn't received her power scooter. I called the company to follow up on this since it's been since May. I was told by Judeth Cornfield and they never received the proper paperwork for it to be approved. I let her know I wasn't sure how that was possible because I called and spoke with Kendal Hymen and went over the form and the questions and asked specifically what needed to be done. The changes were made, the note was edited and it was faxed. Kendal Hymen was not able to speak to at the moment and I left her a vm to call back and see what the disconnect here is.

## 2023-04-07 ENCOUNTER — Telehealth: Payer: Self-pay

## 2023-04-07 ENCOUNTER — Other Ambulatory Visit: Payer: Self-pay | Admitting: Family Medicine

## 2023-04-07 MED ORDER — AMOXICILLIN-POT CLAVULANATE 875-125 MG PO TABS: 1.0000 | ORAL_TABLET | Freq: Two times a day (BID) | ORAL | 0 refills | Status: DC

## 2023-04-07 NOTE — Telephone Encounter (Signed)
-----   Message from Neena Rhymes sent at 04/07/2023 12:45 PM EST ----- Pt's urine culture from home health shows drug resistant UTI.  Prescription for Augmentin sent to pharmacy for pt to start

## 2023-04-07 NOTE — Telephone Encounter (Signed)
Pt has reviewed via MyChart

## 2023-04-08 DIAGNOSIS — I110 Hypertensive heart disease with heart failure: Secondary | ICD-10-CM | POA: Diagnosis not present

## 2023-04-08 DIAGNOSIS — J45901 Unspecified asthma with (acute) exacerbation: Secondary | ICD-10-CM | POA: Diagnosis not present

## 2023-04-08 DIAGNOSIS — G629 Polyneuropathy, unspecified: Secondary | ICD-10-CM | POA: Diagnosis not present

## 2023-04-08 DIAGNOSIS — I5033 Acute on chronic diastolic (congestive) heart failure: Secondary | ICD-10-CM | POA: Diagnosis not present

## 2023-04-08 DIAGNOSIS — K219 Gastro-esophageal reflux disease without esophagitis: Secondary | ICD-10-CM | POA: Diagnosis not present

## 2023-04-08 DIAGNOSIS — E785 Hyperlipidemia, unspecified: Secondary | ICD-10-CM | POA: Diagnosis not present

## 2023-04-08 DIAGNOSIS — E559 Vitamin D deficiency, unspecified: Secondary | ICD-10-CM | POA: Diagnosis not present

## 2023-04-08 DIAGNOSIS — M300 Polyarteritis nodosa: Secondary | ICD-10-CM | POA: Diagnosis not present

## 2023-04-08 DIAGNOSIS — K760 Fatty (change of) liver, not elsewhere classified: Secondary | ICD-10-CM | POA: Diagnosis not present

## 2023-04-08 DIAGNOSIS — G894 Chronic pain syndrome: Secondary | ICD-10-CM | POA: Diagnosis not present

## 2023-04-08 DIAGNOSIS — M4802 Spinal stenosis, cervical region: Secondary | ICD-10-CM | POA: Diagnosis not present

## 2023-04-08 DIAGNOSIS — Z87440 Personal history of urinary (tract) infections: Secondary | ICD-10-CM | POA: Diagnosis not present

## 2023-04-08 DIAGNOSIS — G43909 Migraine, unspecified, not intractable, without status migrainosus: Secondary | ICD-10-CM | POA: Diagnosis not present

## 2023-04-08 DIAGNOSIS — I872 Venous insufficiency (chronic) (peripheral): Secondary | ICD-10-CM | POA: Diagnosis not present

## 2023-04-08 DIAGNOSIS — L400 Psoriasis vulgaris: Secondary | ICD-10-CM | POA: Diagnosis not present

## 2023-04-08 DIAGNOSIS — E538 Deficiency of other specified B group vitamins: Secondary | ICD-10-CM | POA: Diagnosis not present

## 2023-04-08 DIAGNOSIS — G9529 Other cord compression: Secondary | ICD-10-CM | POA: Diagnosis not present

## 2023-04-08 DIAGNOSIS — Z7951 Long term (current) use of inhaled steroids: Secondary | ICD-10-CM | POA: Diagnosis not present

## 2023-04-08 DIAGNOSIS — G4733 Obstructive sleep apnea (adult) (pediatric): Secondary | ICD-10-CM | POA: Diagnosis not present

## 2023-04-08 DIAGNOSIS — J9601 Acute respiratory failure with hypoxia: Secondary | ICD-10-CM | POA: Diagnosis not present

## 2023-04-08 DIAGNOSIS — M797 Fibromyalgia: Secondary | ICD-10-CM | POA: Diagnosis not present

## 2023-04-10 ENCOUNTER — Telehealth: Payer: Self-pay | Admitting: Family Medicine

## 2023-04-10 DIAGNOSIS — J9601 Acute respiratory failure with hypoxia: Secondary | ICD-10-CM | POA: Diagnosis not present

## 2023-04-10 DIAGNOSIS — K219 Gastro-esophageal reflux disease without esophagitis: Secondary | ICD-10-CM | POA: Diagnosis not present

## 2023-04-10 DIAGNOSIS — J45901 Unspecified asthma with (acute) exacerbation: Secondary | ICD-10-CM | POA: Diagnosis not present

## 2023-04-10 DIAGNOSIS — G629 Polyneuropathy, unspecified: Secondary | ICD-10-CM | POA: Diagnosis not present

## 2023-04-10 DIAGNOSIS — K760 Fatty (change of) liver, not elsewhere classified: Secondary | ICD-10-CM | POA: Diagnosis not present

## 2023-04-10 DIAGNOSIS — E785 Hyperlipidemia, unspecified: Secondary | ICD-10-CM | POA: Diagnosis not present

## 2023-04-10 DIAGNOSIS — E538 Deficiency of other specified B group vitamins: Secondary | ICD-10-CM | POA: Diagnosis not present

## 2023-04-10 DIAGNOSIS — Z87440 Personal history of urinary (tract) infections: Secondary | ICD-10-CM | POA: Diagnosis not present

## 2023-04-10 DIAGNOSIS — Z7951 Long term (current) use of inhaled steroids: Secondary | ICD-10-CM | POA: Diagnosis not present

## 2023-04-10 DIAGNOSIS — M797 Fibromyalgia: Secondary | ICD-10-CM | POA: Diagnosis not present

## 2023-04-10 DIAGNOSIS — G4733 Obstructive sleep apnea (adult) (pediatric): Secondary | ICD-10-CM | POA: Diagnosis not present

## 2023-04-10 DIAGNOSIS — I110 Hypertensive heart disease with heart failure: Secondary | ICD-10-CM | POA: Diagnosis not present

## 2023-04-10 DIAGNOSIS — E559 Vitamin D deficiency, unspecified: Secondary | ICD-10-CM | POA: Diagnosis not present

## 2023-04-10 DIAGNOSIS — G9529 Other cord compression: Secondary | ICD-10-CM | POA: Diagnosis not present

## 2023-04-10 DIAGNOSIS — I872 Venous insufficiency (chronic) (peripheral): Secondary | ICD-10-CM | POA: Diagnosis not present

## 2023-04-10 DIAGNOSIS — M300 Polyarteritis nodosa: Secondary | ICD-10-CM | POA: Diagnosis not present

## 2023-04-10 DIAGNOSIS — G894 Chronic pain syndrome: Secondary | ICD-10-CM | POA: Diagnosis not present

## 2023-04-10 DIAGNOSIS — L400 Psoriasis vulgaris: Secondary | ICD-10-CM | POA: Diagnosis not present

## 2023-04-10 DIAGNOSIS — I5033 Acute on chronic diastolic (congestive) heart failure: Secondary | ICD-10-CM | POA: Diagnosis not present

## 2023-04-10 DIAGNOSIS — M4802 Spinal stenosis, cervical region: Secondary | ICD-10-CM | POA: Diagnosis not present

## 2023-04-10 DIAGNOSIS — G43909 Migraine, unspecified, not intractable, without status migrainosus: Secondary | ICD-10-CM | POA: Diagnosis not present

## 2023-04-10 NOTE — Telephone Encounter (Signed)
Placed in folder at [employer]

## 2023-04-10 NOTE — Telephone Encounter (Signed)
Home Health Certification or Plan of Care Tracking  Is this a Certification or Plan of Care? Plan of Care  [facility] Agency: [facility]  Order Number:  [id number]  Has charge sheet been attached? Yes  Where has form been placed:   Labeled & placed in provider bin

## 2023-04-11 DIAGNOSIS — Z87440 Personal history of urinary (tract) infections: Secondary | ICD-10-CM | POA: Diagnosis not present

## 2023-04-11 DIAGNOSIS — E538 Deficiency of other specified B group vitamins: Secondary | ICD-10-CM | POA: Diagnosis not present

## 2023-04-11 DIAGNOSIS — G43909 Migraine, unspecified, not intractable, without status migrainosus: Secondary | ICD-10-CM | POA: Diagnosis not present

## 2023-04-11 DIAGNOSIS — M300 Polyarteritis nodosa: Secondary | ICD-10-CM | POA: Diagnosis not present

## 2023-04-11 DIAGNOSIS — K760 Fatty (change of) liver, not elsewhere classified: Secondary | ICD-10-CM | POA: Diagnosis not present

## 2023-04-11 DIAGNOSIS — J45901 Unspecified asthma with (acute) exacerbation: Secondary | ICD-10-CM | POA: Diagnosis not present

## 2023-04-11 DIAGNOSIS — J9601 Acute respiratory failure with hypoxia: Secondary | ICD-10-CM | POA: Diagnosis not present

## 2023-04-11 DIAGNOSIS — G894 Chronic pain syndrome: Secondary | ICD-10-CM | POA: Diagnosis not present

## 2023-04-11 DIAGNOSIS — M4802 Spinal stenosis, cervical region: Secondary | ICD-10-CM | POA: Diagnosis not present

## 2023-04-11 DIAGNOSIS — I110 Hypertensive heart disease with heart failure: Secondary | ICD-10-CM | POA: Diagnosis not present

## 2023-04-11 DIAGNOSIS — L400 Psoriasis vulgaris: Secondary | ICD-10-CM | POA: Diagnosis not present

## 2023-04-11 DIAGNOSIS — G9529 Other cord compression: Secondary | ICD-10-CM | POA: Diagnosis not present

## 2023-04-11 DIAGNOSIS — E559 Vitamin D deficiency, unspecified: Secondary | ICD-10-CM | POA: Diagnosis not present

## 2023-04-11 DIAGNOSIS — I5033 Acute on chronic diastolic (congestive) heart failure: Secondary | ICD-10-CM | POA: Diagnosis not present

## 2023-04-11 DIAGNOSIS — G629 Polyneuropathy, unspecified: Secondary | ICD-10-CM | POA: Diagnosis not present

## 2023-04-11 DIAGNOSIS — E785 Hyperlipidemia, unspecified: Secondary | ICD-10-CM | POA: Diagnosis not present

## 2023-04-11 DIAGNOSIS — G4733 Obstructive sleep apnea (adult) (pediatric): Secondary | ICD-10-CM | POA: Diagnosis not present

## 2023-04-11 DIAGNOSIS — I872 Venous insufficiency (chronic) (peripheral): Secondary | ICD-10-CM | POA: Diagnosis not present

## 2023-04-11 DIAGNOSIS — K219 Gastro-esophageal reflux disease without esophagitis: Secondary | ICD-10-CM | POA: Diagnosis not present

## 2023-04-11 DIAGNOSIS — M797 Fibromyalgia: Secondary | ICD-10-CM | POA: Diagnosis not present

## 2023-04-11 DIAGNOSIS — Z7951 Long term (current) use of inhaled steroids: Secondary | ICD-10-CM | POA: Diagnosis not present

## 2023-04-12 NOTE — Telephone Encounter (Signed)
Faxed and placed in scan folder

## 2023-04-12 NOTE — Telephone Encounter (Signed)
Forms completed and returned to [place]

## 2023-04-15 DIAGNOSIS — J45909 Unspecified asthma, uncomplicated: Secondary | ICD-10-CM | POA: Diagnosis not present

## 2023-04-17 ENCOUNTER — Encounter: Payer: Self-pay | Admitting: Family Medicine

## 2023-04-17 DIAGNOSIS — E538 Deficiency of other specified B group vitamins: Secondary | ICD-10-CM | POA: Diagnosis not present

## 2023-04-17 DIAGNOSIS — I5033 Acute on chronic diastolic (congestive) heart failure: Secondary | ICD-10-CM | POA: Diagnosis not present

## 2023-04-17 DIAGNOSIS — J9601 Acute respiratory failure with hypoxia: Secondary | ICD-10-CM | POA: Diagnosis not present

## 2023-04-17 DIAGNOSIS — L400 Psoriasis vulgaris: Secondary | ICD-10-CM | POA: Diagnosis not present

## 2023-04-17 DIAGNOSIS — M4802 Spinal stenosis, cervical region: Secondary | ICD-10-CM | POA: Diagnosis not present

## 2023-04-17 DIAGNOSIS — G43909 Migraine, unspecified, not intractable, without status migrainosus: Secondary | ICD-10-CM | POA: Diagnosis not present

## 2023-04-17 DIAGNOSIS — E559 Vitamin D deficiency, unspecified: Secondary | ICD-10-CM | POA: Diagnosis not present

## 2023-04-17 DIAGNOSIS — Z87440 Personal history of urinary (tract) infections: Secondary | ICD-10-CM | POA: Diagnosis not present

## 2023-04-17 DIAGNOSIS — J45901 Unspecified asthma with (acute) exacerbation: Secondary | ICD-10-CM | POA: Diagnosis not present

## 2023-04-17 DIAGNOSIS — K219 Gastro-esophageal reflux disease without esophagitis: Secondary | ICD-10-CM | POA: Diagnosis not present

## 2023-04-17 DIAGNOSIS — G894 Chronic pain syndrome: Secondary | ICD-10-CM | POA: Diagnosis not present

## 2023-04-17 DIAGNOSIS — I872 Venous insufficiency (chronic) (peripheral): Secondary | ICD-10-CM | POA: Diagnosis not present

## 2023-04-17 DIAGNOSIS — I110 Hypertensive heart disease with heart failure: Secondary | ICD-10-CM | POA: Diagnosis not present

## 2023-04-17 DIAGNOSIS — E785 Hyperlipidemia, unspecified: Secondary | ICD-10-CM | POA: Diagnosis not present

## 2023-04-17 DIAGNOSIS — G9529 Other cord compression: Secondary | ICD-10-CM | POA: Diagnosis not present

## 2023-04-17 DIAGNOSIS — K760 Fatty (change of) liver, not elsewhere classified: Secondary | ICD-10-CM | POA: Diagnosis not present

## 2023-04-17 DIAGNOSIS — Z7951 Long term (current) use of inhaled steroids: Secondary | ICD-10-CM | POA: Diagnosis not present

## 2023-04-17 DIAGNOSIS — G629 Polyneuropathy, unspecified: Secondary | ICD-10-CM | POA: Diagnosis not present

## 2023-04-17 DIAGNOSIS — M797 Fibromyalgia: Secondary | ICD-10-CM | POA: Diagnosis not present

## 2023-04-17 DIAGNOSIS — M300 Polyarteritis nodosa: Secondary | ICD-10-CM | POA: Diagnosis not present

## 2023-04-17 DIAGNOSIS — G4733 Obstructive sleep apnea (adult) (pediatric): Secondary | ICD-10-CM | POA: Diagnosis not present

## 2023-04-18 DIAGNOSIS — Z7951 Long term (current) use of inhaled steroids: Secondary | ICD-10-CM | POA: Diagnosis not present

## 2023-04-18 DIAGNOSIS — L400 Psoriasis vulgaris: Secondary | ICD-10-CM | POA: Diagnosis not present

## 2023-04-18 DIAGNOSIS — I110 Hypertensive heart disease with heart failure: Secondary | ICD-10-CM | POA: Diagnosis not present

## 2023-04-18 DIAGNOSIS — M300 Polyarteritis nodosa: Secondary | ICD-10-CM | POA: Diagnosis not present

## 2023-04-18 DIAGNOSIS — M4802 Spinal stenosis, cervical region: Secondary | ICD-10-CM | POA: Diagnosis not present

## 2023-04-18 DIAGNOSIS — G9529 Other cord compression: Secondary | ICD-10-CM | POA: Diagnosis not present

## 2023-04-18 DIAGNOSIS — K760 Fatty (change of) liver, not elsewhere classified: Secondary | ICD-10-CM | POA: Diagnosis not present

## 2023-04-18 DIAGNOSIS — M797 Fibromyalgia: Secondary | ICD-10-CM | POA: Diagnosis not present

## 2023-04-18 DIAGNOSIS — I872 Venous insufficiency (chronic) (peripheral): Secondary | ICD-10-CM | POA: Diagnosis not present

## 2023-04-18 DIAGNOSIS — I5033 Acute on chronic diastolic (congestive) heart failure: Secondary | ICD-10-CM | POA: Diagnosis not present

## 2023-04-18 DIAGNOSIS — J45901 Unspecified asthma with (acute) exacerbation: Secondary | ICD-10-CM | POA: Diagnosis not present

## 2023-04-18 DIAGNOSIS — K219 Gastro-esophageal reflux disease without esophagitis: Secondary | ICD-10-CM | POA: Diagnosis not present

## 2023-04-18 DIAGNOSIS — G894 Chronic pain syndrome: Secondary | ICD-10-CM | POA: Diagnosis not present

## 2023-04-18 DIAGNOSIS — E538 Deficiency of other specified B group vitamins: Secondary | ICD-10-CM | POA: Diagnosis not present

## 2023-04-18 DIAGNOSIS — Z87440 Personal history of urinary (tract) infections: Secondary | ICD-10-CM | POA: Diagnosis not present

## 2023-04-18 DIAGNOSIS — E559 Vitamin D deficiency, unspecified: Secondary | ICD-10-CM | POA: Diagnosis not present

## 2023-04-18 DIAGNOSIS — J9601 Acute respiratory failure with hypoxia: Secondary | ICD-10-CM | POA: Diagnosis not present

## 2023-04-18 DIAGNOSIS — G4733 Obstructive sleep apnea (adult) (pediatric): Secondary | ICD-10-CM | POA: Diagnosis not present

## 2023-04-18 DIAGNOSIS — G629 Polyneuropathy, unspecified: Secondary | ICD-10-CM | POA: Diagnosis not present

## 2023-04-18 DIAGNOSIS — E785 Hyperlipidemia, unspecified: Secondary | ICD-10-CM | POA: Diagnosis not present

## 2023-04-18 DIAGNOSIS — G43909 Migraine, unspecified, not intractable, without status migrainosus: Secondary | ICD-10-CM | POA: Diagnosis not present

## 2023-04-20 DIAGNOSIS — G9529 Other cord compression: Secondary | ICD-10-CM | POA: Diagnosis not present

## 2023-04-20 DIAGNOSIS — E559 Vitamin D deficiency, unspecified: Secondary | ICD-10-CM | POA: Diagnosis not present

## 2023-04-20 DIAGNOSIS — I5033 Acute on chronic diastolic (congestive) heart failure: Secondary | ICD-10-CM | POA: Diagnosis not present

## 2023-04-20 DIAGNOSIS — G4733 Obstructive sleep apnea (adult) (pediatric): Secondary | ICD-10-CM | POA: Diagnosis not present

## 2023-04-20 DIAGNOSIS — E538 Deficiency of other specified B group vitamins: Secondary | ICD-10-CM | POA: Diagnosis not present

## 2023-04-20 DIAGNOSIS — G629 Polyneuropathy, unspecified: Secondary | ICD-10-CM | POA: Diagnosis not present

## 2023-04-20 DIAGNOSIS — M4802 Spinal stenosis, cervical region: Secondary | ICD-10-CM | POA: Diagnosis not present

## 2023-04-20 DIAGNOSIS — M797 Fibromyalgia: Secondary | ICD-10-CM | POA: Diagnosis not present

## 2023-04-20 DIAGNOSIS — G43909 Migraine, unspecified, not intractable, without status migrainosus: Secondary | ICD-10-CM | POA: Diagnosis not present

## 2023-04-20 DIAGNOSIS — I872 Venous insufficiency (chronic) (peripheral): Secondary | ICD-10-CM | POA: Diagnosis not present

## 2023-04-20 DIAGNOSIS — L400 Psoriasis vulgaris: Secondary | ICD-10-CM | POA: Diagnosis not present

## 2023-04-20 DIAGNOSIS — K760 Fatty (change of) liver, not elsewhere classified: Secondary | ICD-10-CM | POA: Diagnosis not present

## 2023-04-20 DIAGNOSIS — E785 Hyperlipidemia, unspecified: Secondary | ICD-10-CM | POA: Diagnosis not present

## 2023-04-20 DIAGNOSIS — J9601 Acute respiratory failure with hypoxia: Secondary | ICD-10-CM | POA: Diagnosis not present

## 2023-04-20 DIAGNOSIS — J45901 Unspecified asthma with (acute) exacerbation: Secondary | ICD-10-CM | POA: Diagnosis not present

## 2023-04-20 DIAGNOSIS — K219 Gastro-esophageal reflux disease without esophagitis: Secondary | ICD-10-CM | POA: Diagnosis not present

## 2023-04-20 DIAGNOSIS — I110 Hypertensive heart disease with heart failure: Secondary | ICD-10-CM | POA: Diagnosis not present

## 2023-04-20 DIAGNOSIS — Z7951 Long term (current) use of inhaled steroids: Secondary | ICD-10-CM | POA: Diagnosis not present

## 2023-04-20 DIAGNOSIS — Z87440 Personal history of urinary (tract) infections: Secondary | ICD-10-CM | POA: Diagnosis not present

## 2023-04-20 DIAGNOSIS — G894 Chronic pain syndrome: Secondary | ICD-10-CM | POA: Diagnosis not present

## 2023-04-20 DIAGNOSIS — M300 Polyarteritis nodosa: Secondary | ICD-10-CM | POA: Diagnosis not present

## 2023-04-24 DIAGNOSIS — G629 Polyneuropathy, unspecified: Secondary | ICD-10-CM | POA: Diagnosis not present

## 2023-04-24 DIAGNOSIS — I110 Hypertensive heart disease with heart failure: Secondary | ICD-10-CM | POA: Diagnosis not present

## 2023-04-24 DIAGNOSIS — M300 Polyarteritis nodosa: Secondary | ICD-10-CM | POA: Diagnosis not present

## 2023-04-24 DIAGNOSIS — E785 Hyperlipidemia, unspecified: Secondary | ICD-10-CM | POA: Diagnosis not present

## 2023-04-24 DIAGNOSIS — I872 Venous insufficiency (chronic) (peripheral): Secondary | ICD-10-CM | POA: Diagnosis not present

## 2023-04-24 DIAGNOSIS — I5033 Acute on chronic diastolic (congestive) heart failure: Secondary | ICD-10-CM | POA: Diagnosis not present

## 2023-04-24 DIAGNOSIS — J45901 Unspecified asthma with (acute) exacerbation: Secondary | ICD-10-CM | POA: Diagnosis not present

## 2023-04-24 DIAGNOSIS — G43909 Migraine, unspecified, not intractable, without status migrainosus: Secondary | ICD-10-CM | POA: Diagnosis not present

## 2023-04-24 DIAGNOSIS — M797 Fibromyalgia: Secondary | ICD-10-CM | POA: Diagnosis not present

## 2023-04-24 DIAGNOSIS — L400 Psoriasis vulgaris: Secondary | ICD-10-CM | POA: Diagnosis not present

## 2023-04-24 DIAGNOSIS — G894 Chronic pain syndrome: Secondary | ICD-10-CM | POA: Diagnosis not present

## 2023-04-24 DIAGNOSIS — K219 Gastro-esophageal reflux disease without esophagitis: Secondary | ICD-10-CM | POA: Diagnosis not present

## 2023-04-24 DIAGNOSIS — Z7951 Long term (current) use of inhaled steroids: Secondary | ICD-10-CM | POA: Diagnosis not present

## 2023-04-24 DIAGNOSIS — M4802 Spinal stenosis, cervical region: Secondary | ICD-10-CM | POA: Diagnosis not present

## 2023-04-24 DIAGNOSIS — G9529 Other cord compression: Secondary | ICD-10-CM | POA: Diagnosis not present

## 2023-04-24 DIAGNOSIS — E538 Deficiency of other specified B group vitamins: Secondary | ICD-10-CM | POA: Diagnosis not present

## 2023-04-24 DIAGNOSIS — G4733 Obstructive sleep apnea (adult) (pediatric): Secondary | ICD-10-CM | POA: Diagnosis not present

## 2023-04-24 DIAGNOSIS — J9601 Acute respiratory failure with hypoxia: Secondary | ICD-10-CM | POA: Diagnosis not present

## 2023-04-24 DIAGNOSIS — E559 Vitamin D deficiency, unspecified: Secondary | ICD-10-CM | POA: Diagnosis not present

## 2023-04-24 DIAGNOSIS — Z87440 Personal history of urinary (tract) infections: Secondary | ICD-10-CM | POA: Diagnosis not present

## 2023-04-24 DIAGNOSIS — K760 Fatty (change of) liver, not elsewhere classified: Secondary | ICD-10-CM | POA: Diagnosis not present

## 2023-04-25 NOTE — Telephone Encounter (Signed)
Spoke with Home Health Nurse - She dropped off urine culture yesterday 04/24/2023 The results will be faxed to PCP

## 2023-04-26 ENCOUNTER — Encounter: Payer: Self-pay | Admitting: Family Medicine

## 2023-04-26 ENCOUNTER — Telehealth: Payer: Self-pay | Admitting: Family Medicine

## 2023-04-26 ENCOUNTER — Telehealth: Payer: Self-pay

## 2023-04-26 ENCOUNTER — Other Ambulatory Visit: Payer: Self-pay | Admitting: Family Medicine

## 2023-04-26 DIAGNOSIS — I872 Venous insufficiency (chronic) (peripheral): Secondary | ICD-10-CM | POA: Diagnosis not present

## 2023-04-26 DIAGNOSIS — E559 Vitamin D deficiency, unspecified: Secondary | ICD-10-CM | POA: Diagnosis not present

## 2023-04-26 DIAGNOSIS — G9589 Other specified diseases of spinal cord: Secondary | ICD-10-CM

## 2023-04-26 DIAGNOSIS — G894 Chronic pain syndrome: Secondary | ICD-10-CM | POA: Diagnosis not present

## 2023-04-26 DIAGNOSIS — J45901 Unspecified asthma with (acute) exacerbation: Secondary | ICD-10-CM | POA: Diagnosis not present

## 2023-04-26 DIAGNOSIS — E785 Hyperlipidemia, unspecified: Secondary | ICD-10-CM | POA: Diagnosis not present

## 2023-04-26 DIAGNOSIS — M797 Fibromyalgia: Secondary | ICD-10-CM | POA: Diagnosis not present

## 2023-04-26 DIAGNOSIS — G4733 Obstructive sleep apnea (adult) (pediatric): Secondary | ICD-10-CM | POA: Diagnosis not present

## 2023-04-26 DIAGNOSIS — J9601 Acute respiratory failure with hypoxia: Secondary | ICD-10-CM | POA: Diagnosis not present

## 2023-04-26 DIAGNOSIS — M4802 Spinal stenosis, cervical region: Secondary | ICD-10-CM | POA: Diagnosis not present

## 2023-04-26 DIAGNOSIS — I5033 Acute on chronic diastolic (congestive) heart failure: Secondary | ICD-10-CM | POA: Diagnosis not present

## 2023-04-26 DIAGNOSIS — K760 Fatty (change of) liver, not elsewhere classified: Secondary | ICD-10-CM | POA: Diagnosis not present

## 2023-04-26 DIAGNOSIS — Z87440 Personal history of urinary (tract) infections: Secondary | ICD-10-CM | POA: Diagnosis not present

## 2023-04-26 DIAGNOSIS — G9529 Other cord compression: Secondary | ICD-10-CM | POA: Diagnosis not present

## 2023-04-26 DIAGNOSIS — G629 Polyneuropathy, unspecified: Secondary | ICD-10-CM | POA: Diagnosis not present

## 2023-04-26 DIAGNOSIS — I110 Hypertensive heart disease with heart failure: Secondary | ICD-10-CM | POA: Diagnosis not present

## 2023-04-26 DIAGNOSIS — M300 Polyarteritis nodosa: Secondary | ICD-10-CM | POA: Diagnosis not present

## 2023-04-26 DIAGNOSIS — Z7951 Long term (current) use of inhaled steroids: Secondary | ICD-10-CM | POA: Diagnosis not present

## 2023-04-26 DIAGNOSIS — G43909 Migraine, unspecified, not intractable, without status migrainosus: Secondary | ICD-10-CM | POA: Diagnosis not present

## 2023-04-26 DIAGNOSIS — E538 Deficiency of other specified B group vitamins: Secondary | ICD-10-CM | POA: Diagnosis not present

## 2023-04-26 DIAGNOSIS — L400 Psoriasis vulgaris: Secondary | ICD-10-CM | POA: Diagnosis not present

## 2023-04-26 DIAGNOSIS — K219 Gastro-esophageal reflux disease without esophagitis: Secondary | ICD-10-CM | POA: Diagnosis not present

## 2023-04-26 NOTE — Telephone Encounter (Signed)
Home Health Certification or Plan of Care Tracking  Is this a Certification or Plan of Care? Plan of Care  [facility] Agency: [facility]  Order Number:  [id number]  Has charge sheet been attached? Yes  Where has form been placed:   Labeled & placed in provider bin

## 2023-04-26 NOTE — Telephone Encounter (Signed)
Placed in folder at nurse station

## 2023-04-26 NOTE — Telephone Encounter (Signed)
-----   Message from Neena Rhymes sent at 04/26/2023  3:00 PM EST ----- Urine dip and culture are both negative- this is great news!

## 2023-04-26 NOTE — Telephone Encounter (Signed)
Pt has been notified and states she is feeling better

## 2023-04-27 ENCOUNTER — Other Ambulatory Visit: Payer: Self-pay | Admitting: Family Medicine

## 2023-04-27 ENCOUNTER — Telehealth: Payer: Self-pay | Admitting: Family Medicine

## 2023-04-27 NOTE — Telephone Encounter (Addendum)
Pt wanted a return phone call from [place]. She was complaining of a cough that still feels down in her chest. But I went ahead and scheduled an appt for Friday 12/13. Offered to get her a virtual with another provider today and she would prefer to see Dr. Beverely Low. She had pneumonia Dec 7th and was in hospital and she insists she does not want to go to the ER for this cough.

## 2023-04-27 NOTE — Telephone Encounter (Signed)
Pt had pneumonia last year 04/21/22 and does not want to go to ED for cough this year, she also is concerned she has a UTI as well, I advised taking a cough suppressant and hydrating well until her appointment tomorrow and try to rest but that we could do testing and discuss treatment at the visit tomorrow

## 2023-04-27 NOTE — Telephone Encounter (Signed)
Pt reports this is a dry cough. She denies fever or any other sx today. Advised pt to go to ER is any sx start over night. Pt has Video appt tomorrow w/Dr.[name]  Pt will take mucinex OTC for cough

## 2023-04-28 ENCOUNTER — Encounter: Payer: Self-pay | Admitting: Family Medicine

## 2023-04-28 ENCOUNTER — Telehealth (INDEPENDENT_AMBULATORY_CARE_PROVIDER_SITE_OTHER): Payer: 59 | Admitting: Family Medicine

## 2023-04-28 DIAGNOSIS — R051 Acute cough: Secondary | ICD-10-CM | POA: Diagnosis not present

## 2023-04-28 DIAGNOSIS — G9589 Other specified diseases of spinal cord: Secondary | ICD-10-CM | POA: Diagnosis not present

## 2023-04-28 NOTE — Progress Notes (Unsigned)
Virtual Visit via Video   I connected with patient on 04/28/23 at 10:40 AM EST by a video enabled telemedicine application and verified that I am speaking with the correct person using two identifiers.  Location patient: Home Location provider: [facility], Office Persons participating in the virtual visit: Patient, Provider, CMA Archie Patten H)  I discussed the limitations of evaluation and management by telemedicine and the availability of in person appointments. The patient expressed understanding and agreed to proceed.  Subjective:   HPI:   Cough- pt reports dry cough at night.  No fever.  Denies increased SOB.  HH comes weekly and listens to lungs.    Motorized wheelchair- pt spoke to insurance on Wednesday and was told to fax papers and order to a different company.  [place] has been 'working' on this for 18 months w/ no progress.  ROS:   See pertinent positives and negatives per HPI.  Patient Active Problem List   Diagnosis Date Noted   Asthma, chronic, unspecified asthma severity, with acute exacerbation 01/12/2023   Hypomagnesemia 01/12/2023   Bedbound 12/14/2022   Acute on chronic heart failure with preserved ejection fraction (HFpEF) (HCC) 11/23/2022   Pressure injury of skin 11/23/2022   Nocturnal hypoxemia 06/21/2022   Elevated LFTs 05/06/2022   Obesity hypoventilation syndrome (HCC) 05/06/2022   Acute respiratory failure with hypoxia (HCC) 04/22/2022   Hypokalemia 04/22/2022   Leg cramps 01/12/2022   Fever of unknown origin 08/12/2021   Sacral pressure sore 07/06/2021   Vitamin B6 deficiency 06/08/2021   Myelomalacia of cervical cord (HCC) 06/06/2021   Compressive cervical cord myelomalacia (HCC) 06/05/2021   B12 deficiency 06/05/2021   Folate deficiency 06/05/2021   Vitamin D deficiency 06/05/2021   Leg weakness, bilateral 06/04/2021   Cervical atypia 01/06/2021   Steatosis of liver 01/06/2021   Hyponatremia 07/27/2019   Urinary  urgency 04/04/2018   Chronic narcotic use 09/07/2016   Acute blood loss anemia 01/14/2016   Multiple gastric ulcers    PAN (polyarteritis nodosa) (HCC) 11/24/2015   GERD (gastroesophageal reflux disease) 01/08/2014   Routine general medical examination at a health care facility 04/23/2013   Bariatric surgery status 10/31/2012   S/P skin biopsy 10/31/2012   Psoriasis 01/09/2012   DDD (degenerative disc disease), lumbar 11/30/2011   Migraine headache    [name] (obstructive sleep apnea) 11/16/2010   HYPERGLYCEMIA, FASTING 10/08/2009   CONTRACTURE OF TENDON 08/17/2009   PARESTHESIA, HANDS 12/23/2008   Leg pain, right 11/05/2008   ADVERSE DRUG REACTION 04/03/2008   PERIPHERAL EDEMA 03/26/2008   Obesity, morbid, BMI 50 or higher (HCC) 01/03/2007   Cellulitis 01/03/2007   Hyperlipidemia 09/22/2006   Fibromyalgia 09/20/2006   Anxiety and depression 06/28/2006   Essential hypertension 06/28/2006    Social History   Tobacco Use   Smoking status: Never   Smokeless tobacco: Never  Substance Use Topics   Alcohol use: No    Current Outpatient Medications:    ALPRAZolam (XANAX) 1 MG tablet, Take 1 tablet (1 mg total) by mouth 2 (two) times daily., Disp: 60 tablet, Rfl: 3   amoxicillin-clavulanate (AUGMENTIN) 875-125 MG tablet, Take 1 tablet by mouth 2 (two) times daily., Disp: 14 tablet, Rfl: 0   ARIPiprazole (ABILIFY) 15 MG tablet, TAKE 1 TABLET BY MOUTH EVERY DAY, Disp: 90 tablet, Rfl: 1   Armodafinil 250 MG tablet, Take 1 tablet (250 mg total) by mouth in the morning and at bedtime., Disp: 60 tablet, Rfl: 3   Biotin 10 MG TABS,  Take 1 tablet (10 mg total) by mouth in the morning., Disp: 90 tablet, Rfl: 1   buPROPion (WELLBUTRIN XL) 300 MG 24 hr tablet, TAKE 1 TABLET BY MOUTH EVERY DAY, Disp: 30 tablet, Rfl: 2   calcium citrate (CALCITRATE - DOSED IN MG ELEMENTAL CALCIUM) 950 (200 Ca) MG tablet, Take 1 tablet (200 mg of elemental calcium total) by mouth daily., Disp: 90 tablet, Rfl: 1    clonazePAM (KLONOPIN) 1 MG tablet, TAKE 1 TABLET BY MOUTH AT BEDTIME, Disp: 30 tablet, Rfl: 3   cyanocobalamin (VITAMIN B12) 1000 MCG tablet, TAKE 1 TABLET BY MOUTH EVERY DAY, Disp: 30 tablet, Rfl: 2   cyclobenzaprine (FLEXERIL) 10 MG tablet, TAKE 2 TABLETS BY MOUTH 2 TIMES DAILY, Disp: 120 tablet, Rfl: 2   diclofenac Sodium (VOLTAREN) 1 % GEL, Apply 4 g topically 4 (four) times daily., Disp: 1 g, Rfl: 1   DULoxetine (CYMBALTA) 60 MG capsule, Take 2 capsules (120 mg total) by mouth daily. (Patient taking differently: Take 60 mg by mouth 2 (two) times daily. Pt reported taking two capsules(120mg ) once daily), Disp: 240 capsule, Rfl: 2   FEROSUL 325 (65 Fe) MG tablet, TAKE 1 TABLET BY MOUTH EVERY MORNING WITH BREAKFAST, Disp: 30 tablet, Rfl: 2   fluticasone-salmeterol (ADVAIR) 250-50 MCG/ACT AEPB, Inhale 1 puff into the lungs every 12 (twelve) hours., Disp: 60 each, Rfl: 5   folic acid (FOLVITE) 1 MG tablet, TAKE 1 TABLET BY MOUTH ONCE DAILY, Disp: 30 tablet, Rfl: 2   furosemide (LASIX) 40 MG tablet, Take 1 tablet by mouth daily. May take an additional tablet by mouth IF weight gain of 5 lbs and CALL MD for further instructions., Disp: 90 tablet, Rfl: 1   gabapentin (NEURONTIN) 600 MG tablet, Take 1 tablet (600 mg total) by mouth 2 (two) times daily as needed. Pt takes daily - can take up to twice daily, Disp: 60 tablet, Rfl: 1   HYDROcodone-acetaminophen (NORCO) 10-325 MG tablet, TAKE 1 TABLET BY MOUTH EVERY 8 HOURS AS NEEDED, Disp: 90 tablet, Rfl: 0   ipratropium-albuterol (DUONEB) 0.5-2.5 (3) MG/3ML SOLN, Take 3 mLs by nebulization every 6 (six) hours as needed (wheezing/shortness of breath)., Disp: 360 mL, Rfl: 0   nebivolol (BYSTOLIC) 10 MG tablet, TAKE 1 TABLET BY MOUTH ONCE DAILY, Disp: 30 tablet, Rfl: 2   nystatin (MYCOSTATIN/NYSTOP) powder, APPLY 1 APPLICATION TOPICALLY 3 TIMES DAILY (Patient taking differently: Apply 1 Application topically 3 (three) times daily as needed (yeast infection).),  Disp: 60 g, Rfl: 3   Oxcarbazepine (TRILEPTAL) 300 MG tablet, Take 300 mg by mouth 2 (two) times daily., Disp: , Rfl:    pantoprazole (PROTONIX) 40 MG tablet, TAKE 1 TABLET BY MOUTH 2 TIMES DAILY, Disp: 60 tablet, Rfl: 2   phentermine 37.5 MG capsule, Take 1 capsule (37.5 mg total) by mouth every morning., Disp: 90 capsule, Rfl: 0   promethazine (PHENERGAN) 25 MG tablet, TAKE 1 TABLET BY MOUTH EVERY 6 HOURS AS NEEDED FOR NAUSEA AND VOMITING, Disp: 45 tablet, Rfl: 3   pyridOXINE (VITAMIN B6) 100 MG tablet, TAKE 1 TABLET BY MOUTH EVERY DAY, Disp: 30 tablet, Rfl: 2   Thiamine HCl (B-1) 100 MG TABS, TAKE 1 TABLET BY MOUTH EVERY DAY, Disp: 30 tablet, Rfl: 2   potassium chloride (KLOR-CON) 20 MEQ packet, Take 1 packet by mouth daily. Take with lasix., Disp: 30 packet, Rfl: 1  Allergies  Allergen Reactions   Erythromycin Anaphylaxis   Niacin Anaphylaxis, Shortness Of Breath and Swelling  Swelling and "problems breathing"   Sulfamethoxazole-Trimethoprim Anaphylaxis, Swelling, Rash and Other (See Comments)    Throat closed and the eyes became swollen   Aspirin Hives   Bactrim Hives   Benzoin Compound Rash   Cephalexin Hives    Tolerated ceftriaxone and cefepime 07/2019   Iohexol Hives    Pt treated with PO benedryl   Lisinopril Hives   Naproxen Hives   Pnu-Imune [Pneumococcal Polysaccharide Vaccine] Rash   Sulfonamide Derivatives Rash    Objective:   There were no vitals taken for this visit.  Patient is well-developed, well-nourished in no acute distress.  Resting comfortably *** at home.  Head is normocephalic, atraumatic.  No labored breathing.  Speech is clear and coherent with logical content.  Patient is alert and oriented at baseline.  ***  Assessment and Plan:        ***.   Neena Rhymes, MD 04/28/2023  Time spent with the patient: *** minutes, of which >50% was spent in obtaining information about symptoms, reviewing previous labs, evaluations, and treatments,  counseling about condition (please see the discussed topics above), and developing a plan to further investigate it; had a number of questions which I addressed.

## 2023-04-29 ENCOUNTER — Other Ambulatory Visit: Payer: Self-pay | Admitting: Family Medicine

## 2023-04-29 DIAGNOSIS — G9529 Other cord compression: Secondary | ICD-10-CM | POA: Diagnosis not present

## 2023-04-29 DIAGNOSIS — J9601 Acute respiratory failure with hypoxia: Secondary | ICD-10-CM | POA: Diagnosis not present

## 2023-04-29 DIAGNOSIS — M300 Polyarteritis nodosa: Secondary | ICD-10-CM | POA: Diagnosis not present

## 2023-04-29 DIAGNOSIS — M797 Fibromyalgia: Secondary | ICD-10-CM | POA: Diagnosis not present

## 2023-04-29 DIAGNOSIS — K760 Fatty (change of) liver, not elsewhere classified: Secondary | ICD-10-CM | POA: Diagnosis not present

## 2023-04-29 DIAGNOSIS — M4802 Spinal stenosis, cervical region: Secondary | ICD-10-CM | POA: Diagnosis not present

## 2023-04-29 DIAGNOSIS — G894 Chronic pain syndrome: Secondary | ICD-10-CM | POA: Diagnosis not present

## 2023-04-29 DIAGNOSIS — G43909 Migraine, unspecified, not intractable, without status migrainosus: Secondary | ICD-10-CM | POA: Diagnosis not present

## 2023-04-29 DIAGNOSIS — E538 Deficiency of other specified B group vitamins: Secondary | ICD-10-CM | POA: Diagnosis not present

## 2023-04-29 DIAGNOSIS — I872 Venous insufficiency (chronic) (peripheral): Secondary | ICD-10-CM | POA: Diagnosis not present

## 2023-04-29 DIAGNOSIS — E785 Hyperlipidemia, unspecified: Secondary | ICD-10-CM | POA: Diagnosis not present

## 2023-04-29 DIAGNOSIS — G629 Polyneuropathy, unspecified: Secondary | ICD-10-CM | POA: Diagnosis not present

## 2023-04-29 DIAGNOSIS — G4733 Obstructive sleep apnea (adult) (pediatric): Secondary | ICD-10-CM | POA: Diagnosis not present

## 2023-04-29 DIAGNOSIS — J45901 Unspecified asthma with (acute) exacerbation: Secondary | ICD-10-CM | POA: Diagnosis not present

## 2023-04-29 DIAGNOSIS — E559 Vitamin D deficiency, unspecified: Secondary | ICD-10-CM | POA: Diagnosis not present

## 2023-04-29 DIAGNOSIS — I110 Hypertensive heart disease with heart failure: Secondary | ICD-10-CM | POA: Diagnosis not present

## 2023-04-29 DIAGNOSIS — L400 Psoriasis vulgaris: Secondary | ICD-10-CM | POA: Diagnosis not present

## 2023-04-29 DIAGNOSIS — Z7951 Long term (current) use of inhaled steroids: Secondary | ICD-10-CM | POA: Diagnosis not present

## 2023-04-29 DIAGNOSIS — K219 Gastro-esophageal reflux disease without esophagitis: Secondary | ICD-10-CM | POA: Diagnosis not present

## 2023-04-29 DIAGNOSIS — I5033 Acute on chronic diastolic (congestive) heart failure: Secondary | ICD-10-CM | POA: Diagnosis not present

## 2023-04-29 DIAGNOSIS — Z87440 Personal history of urinary (tract) infections: Secondary | ICD-10-CM | POA: Diagnosis not present

## 2023-04-29 MED ORDER — PHENTERMINE HCL 37.5 MG PO CAPS: 37.5000 mg | ORAL_CAPSULE | ORAL | 0 refills | Status: DC

## 2023-05-01 DIAGNOSIS — J45901 Unspecified asthma with (acute) exacerbation: Secondary | ICD-10-CM | POA: Diagnosis not present

## 2023-05-01 DIAGNOSIS — G43909 Migraine, unspecified, not intractable, without status migrainosus: Secondary | ICD-10-CM | POA: Diagnosis not present

## 2023-05-01 DIAGNOSIS — E559 Vitamin D deficiency, unspecified: Secondary | ICD-10-CM | POA: Diagnosis not present

## 2023-05-01 DIAGNOSIS — M4802 Spinal stenosis, cervical region: Secondary | ICD-10-CM | POA: Diagnosis not present

## 2023-05-01 DIAGNOSIS — I5033 Acute on chronic diastolic (congestive) heart failure: Secondary | ICD-10-CM | POA: Diagnosis not present

## 2023-05-01 DIAGNOSIS — E538 Deficiency of other specified B group vitamins: Secondary | ICD-10-CM | POA: Diagnosis not present

## 2023-05-01 DIAGNOSIS — I110 Hypertensive heart disease with heart failure: Secondary | ICD-10-CM | POA: Diagnosis not present

## 2023-05-01 DIAGNOSIS — E785 Hyperlipidemia, unspecified: Secondary | ICD-10-CM | POA: Diagnosis not present

## 2023-05-01 DIAGNOSIS — K219 Gastro-esophageal reflux disease without esophagitis: Secondary | ICD-10-CM | POA: Diagnosis not present

## 2023-05-01 DIAGNOSIS — M797 Fibromyalgia: Secondary | ICD-10-CM | POA: Diagnosis not present

## 2023-05-01 DIAGNOSIS — Z7951 Long term (current) use of inhaled steroids: Secondary | ICD-10-CM | POA: Diagnosis not present

## 2023-05-01 DIAGNOSIS — I872 Venous insufficiency (chronic) (peripheral): Secondary | ICD-10-CM | POA: Diagnosis not present

## 2023-05-01 DIAGNOSIS — L400 Psoriasis vulgaris: Secondary | ICD-10-CM | POA: Diagnosis not present

## 2023-05-01 DIAGNOSIS — G629 Polyneuropathy, unspecified: Secondary | ICD-10-CM | POA: Diagnosis not present

## 2023-05-01 DIAGNOSIS — Z87440 Personal history of urinary (tract) infections: Secondary | ICD-10-CM | POA: Diagnosis not present

## 2023-05-01 DIAGNOSIS — G9529 Other cord compression: Secondary | ICD-10-CM | POA: Diagnosis not present

## 2023-05-01 DIAGNOSIS — K760 Fatty (change of) liver, not elsewhere classified: Secondary | ICD-10-CM | POA: Diagnosis not present

## 2023-05-01 DIAGNOSIS — J9601 Acute respiratory failure with hypoxia: Secondary | ICD-10-CM | POA: Diagnosis not present

## 2023-05-01 DIAGNOSIS — G894 Chronic pain syndrome: Secondary | ICD-10-CM | POA: Diagnosis not present

## 2023-05-01 DIAGNOSIS — M300 Polyarteritis nodosa: Secondary | ICD-10-CM | POA: Diagnosis not present

## 2023-05-01 DIAGNOSIS — G4733 Obstructive sleep apnea (adult) (pediatric): Secondary | ICD-10-CM | POA: Diagnosis not present

## 2023-05-01 NOTE — Telephone Encounter (Signed)
 Forms completed and returned to [place]

## 2023-05-01 NOTE — Telephone Encounter (Signed)
Faxed

## 2023-05-03 DIAGNOSIS — E559 Vitamin D deficiency, unspecified: Secondary | ICD-10-CM | POA: Diagnosis not present

## 2023-05-03 DIAGNOSIS — G629 Polyneuropathy, unspecified: Secondary | ICD-10-CM | POA: Diagnosis not present

## 2023-05-03 DIAGNOSIS — E785 Hyperlipidemia, unspecified: Secondary | ICD-10-CM | POA: Diagnosis not present

## 2023-05-03 DIAGNOSIS — I110 Hypertensive heart disease with heart failure: Secondary | ICD-10-CM | POA: Diagnosis not present

## 2023-05-03 DIAGNOSIS — M300 Polyarteritis nodosa: Secondary | ICD-10-CM | POA: Diagnosis not present

## 2023-05-03 DIAGNOSIS — K219 Gastro-esophageal reflux disease without esophagitis: Secondary | ICD-10-CM | POA: Diagnosis not present

## 2023-05-03 DIAGNOSIS — J45901 Unspecified asthma with (acute) exacerbation: Secondary | ICD-10-CM | POA: Diagnosis not present

## 2023-05-03 DIAGNOSIS — I5033 Acute on chronic diastolic (congestive) heart failure: Secondary | ICD-10-CM | POA: Diagnosis not present

## 2023-05-03 DIAGNOSIS — J9601 Acute respiratory failure with hypoxia: Secondary | ICD-10-CM | POA: Diagnosis not present

## 2023-05-03 DIAGNOSIS — G4733 Obstructive sleep apnea (adult) (pediatric): Secondary | ICD-10-CM | POA: Diagnosis not present

## 2023-05-03 DIAGNOSIS — G9529 Other cord compression: Secondary | ICD-10-CM | POA: Diagnosis not present

## 2023-05-03 DIAGNOSIS — Z87440 Personal history of urinary (tract) infections: Secondary | ICD-10-CM | POA: Diagnosis not present

## 2023-05-03 DIAGNOSIS — E538 Deficiency of other specified B group vitamins: Secondary | ICD-10-CM | POA: Diagnosis not present

## 2023-05-03 DIAGNOSIS — M797 Fibromyalgia: Secondary | ICD-10-CM | POA: Diagnosis not present

## 2023-05-03 DIAGNOSIS — G894 Chronic pain syndrome: Secondary | ICD-10-CM | POA: Diagnosis not present

## 2023-05-03 DIAGNOSIS — L400 Psoriasis vulgaris: Secondary | ICD-10-CM | POA: Diagnosis not present

## 2023-05-03 DIAGNOSIS — M4802 Spinal stenosis, cervical region: Secondary | ICD-10-CM | POA: Diagnosis not present

## 2023-05-03 DIAGNOSIS — Z7951 Long term (current) use of inhaled steroids: Secondary | ICD-10-CM | POA: Diagnosis not present

## 2023-05-03 DIAGNOSIS — I872 Venous insufficiency (chronic) (peripheral): Secondary | ICD-10-CM | POA: Diagnosis not present

## 2023-05-03 DIAGNOSIS — K760 Fatty (change of) liver, not elsewhere classified: Secondary | ICD-10-CM | POA: Diagnosis not present

## 2023-05-03 DIAGNOSIS — G43909 Migraine, unspecified, not intractable, without status migrainosus: Secondary | ICD-10-CM | POA: Diagnosis not present

## 2023-05-03 NOTE — Telephone Encounter (Signed)
Spoke with Home Health Nurse [name] and she was out today with patient and pt never told her about this discomfort. She states he lungs sound good. Nurse [name] is going to put in order for Urine to be collected PRN, she is unsure when this will be done due to her not going back out until Thursday and Holidays. [name] also, states she thinks pt is having a lot of anxiety and has been encouraging pt to get back in therapy. Pts son told nurse pt is waking up gasping for air and having dreams. She is taking a Xanax under tongue and going back to sleep.

## 2023-05-05 DIAGNOSIS — Z87440 Personal history of urinary (tract) infections: Secondary | ICD-10-CM | POA: Diagnosis not present

## 2023-05-05 DIAGNOSIS — G9529 Other cord compression: Secondary | ICD-10-CM | POA: Diagnosis not present

## 2023-05-05 DIAGNOSIS — J9601 Acute respiratory failure with hypoxia: Secondary | ICD-10-CM | POA: Diagnosis not present

## 2023-05-05 DIAGNOSIS — I5033 Acute on chronic diastolic (congestive) heart failure: Secondary | ICD-10-CM | POA: Diagnosis not present

## 2023-05-05 DIAGNOSIS — I872 Venous insufficiency (chronic) (peripheral): Secondary | ICD-10-CM | POA: Diagnosis not present

## 2023-05-05 DIAGNOSIS — G43909 Migraine, unspecified, not intractable, without status migrainosus: Secondary | ICD-10-CM | POA: Diagnosis not present

## 2023-05-05 DIAGNOSIS — G4733 Obstructive sleep apnea (adult) (pediatric): Secondary | ICD-10-CM | POA: Diagnosis not present

## 2023-05-05 DIAGNOSIS — M300 Polyarteritis nodosa: Secondary | ICD-10-CM | POA: Diagnosis not present

## 2023-05-05 DIAGNOSIS — I110 Hypertensive heart disease with heart failure: Secondary | ICD-10-CM | POA: Diagnosis not present

## 2023-05-05 DIAGNOSIS — E559 Vitamin D deficiency, unspecified: Secondary | ICD-10-CM | POA: Diagnosis not present

## 2023-05-05 DIAGNOSIS — J45901 Unspecified asthma with (acute) exacerbation: Secondary | ICD-10-CM | POA: Diagnosis not present

## 2023-05-05 DIAGNOSIS — K219 Gastro-esophageal reflux disease without esophagitis: Secondary | ICD-10-CM | POA: Diagnosis not present

## 2023-05-05 DIAGNOSIS — M797 Fibromyalgia: Secondary | ICD-10-CM | POA: Diagnosis not present

## 2023-05-05 DIAGNOSIS — K760 Fatty (change of) liver, not elsewhere classified: Secondary | ICD-10-CM | POA: Diagnosis not present

## 2023-05-05 DIAGNOSIS — E538 Deficiency of other specified B group vitamins: Secondary | ICD-10-CM | POA: Diagnosis not present

## 2023-05-05 DIAGNOSIS — Z7951 Long term (current) use of inhaled steroids: Secondary | ICD-10-CM | POA: Diagnosis not present

## 2023-05-05 DIAGNOSIS — M4802 Spinal stenosis, cervical region: Secondary | ICD-10-CM | POA: Diagnosis not present

## 2023-05-05 DIAGNOSIS — G629 Polyneuropathy, unspecified: Secondary | ICD-10-CM | POA: Diagnosis not present

## 2023-05-05 DIAGNOSIS — E785 Hyperlipidemia, unspecified: Secondary | ICD-10-CM | POA: Diagnosis not present

## 2023-05-05 DIAGNOSIS — L400 Psoriasis vulgaris: Secondary | ICD-10-CM | POA: Diagnosis not present

## 2023-05-05 DIAGNOSIS — G894 Chronic pain syndrome: Secondary | ICD-10-CM | POA: Diagnosis not present

## 2023-05-06 DIAGNOSIS — M797 Fibromyalgia: Secondary | ICD-10-CM | POA: Diagnosis not present

## 2023-05-06 DIAGNOSIS — G894 Chronic pain syndrome: Secondary | ICD-10-CM | POA: Diagnosis not present

## 2023-05-06 DIAGNOSIS — E538 Deficiency of other specified B group vitamins: Secondary | ICD-10-CM | POA: Diagnosis not present

## 2023-05-06 DIAGNOSIS — G43909 Migraine, unspecified, not intractable, without status migrainosus: Secondary | ICD-10-CM | POA: Diagnosis not present

## 2023-05-06 DIAGNOSIS — G9529 Other cord compression: Secondary | ICD-10-CM | POA: Diagnosis not present

## 2023-05-06 DIAGNOSIS — I5033 Acute on chronic diastolic (congestive) heart failure: Secondary | ICD-10-CM | POA: Diagnosis not present

## 2023-05-06 DIAGNOSIS — E785 Hyperlipidemia, unspecified: Secondary | ICD-10-CM | POA: Diagnosis not present

## 2023-05-06 DIAGNOSIS — E559 Vitamin D deficiency, unspecified: Secondary | ICD-10-CM | POA: Diagnosis not present

## 2023-05-06 DIAGNOSIS — J45901 Unspecified asthma with (acute) exacerbation: Secondary | ICD-10-CM | POA: Diagnosis not present

## 2023-05-06 DIAGNOSIS — M4802 Spinal stenosis, cervical region: Secondary | ICD-10-CM | POA: Diagnosis not present

## 2023-05-06 DIAGNOSIS — I110 Hypertensive heart disease with heart failure: Secondary | ICD-10-CM | POA: Diagnosis not present

## 2023-05-06 DIAGNOSIS — Z87440 Personal history of urinary (tract) infections: Secondary | ICD-10-CM | POA: Diagnosis not present

## 2023-05-06 DIAGNOSIS — G629 Polyneuropathy, unspecified: Secondary | ICD-10-CM | POA: Diagnosis not present

## 2023-05-06 DIAGNOSIS — G4733 Obstructive sleep apnea (adult) (pediatric): Secondary | ICD-10-CM | POA: Diagnosis not present

## 2023-05-06 DIAGNOSIS — M300 Polyarteritis nodosa: Secondary | ICD-10-CM | POA: Diagnosis not present

## 2023-05-06 DIAGNOSIS — K219 Gastro-esophageal reflux disease without esophagitis: Secondary | ICD-10-CM | POA: Diagnosis not present

## 2023-05-06 DIAGNOSIS — L400 Psoriasis vulgaris: Secondary | ICD-10-CM | POA: Diagnosis not present

## 2023-05-06 DIAGNOSIS — Z7951 Long term (current) use of inhaled steroids: Secondary | ICD-10-CM | POA: Diagnosis not present

## 2023-05-06 DIAGNOSIS — K760 Fatty (change of) liver, not elsewhere classified: Secondary | ICD-10-CM | POA: Diagnosis not present

## 2023-05-06 DIAGNOSIS — N390 Urinary tract infection, site not specified: Secondary | ICD-10-CM | POA: Diagnosis not present

## 2023-05-06 DIAGNOSIS — J9601 Acute respiratory failure with hypoxia: Secondary | ICD-10-CM | POA: Diagnosis not present

## 2023-05-06 DIAGNOSIS — I872 Venous insufficiency (chronic) (peripheral): Secondary | ICD-10-CM | POA: Diagnosis not present

## 2023-05-08 ENCOUNTER — Telehealth: Payer: Self-pay

## 2023-05-08 DIAGNOSIS — N390 Urinary tract infection, site not specified: Secondary | ICD-10-CM

## 2023-05-08 DIAGNOSIS — G4733 Obstructive sleep apnea (adult) (pediatric): Secondary | ICD-10-CM | POA: Diagnosis not present

## 2023-05-08 DIAGNOSIS — E785 Hyperlipidemia, unspecified: Secondary | ICD-10-CM | POA: Diagnosis not present

## 2023-05-08 DIAGNOSIS — I110 Hypertensive heart disease with heart failure: Secondary | ICD-10-CM | POA: Diagnosis not present

## 2023-05-08 DIAGNOSIS — M797 Fibromyalgia: Secondary | ICD-10-CM | POA: Diagnosis not present

## 2023-05-08 DIAGNOSIS — E538 Deficiency of other specified B group vitamins: Secondary | ICD-10-CM | POA: Diagnosis not present

## 2023-05-08 DIAGNOSIS — M300 Polyarteritis nodosa: Secondary | ICD-10-CM | POA: Diagnosis not present

## 2023-05-08 DIAGNOSIS — K760 Fatty (change of) liver, not elsewhere classified: Secondary | ICD-10-CM | POA: Diagnosis not present

## 2023-05-08 DIAGNOSIS — G894 Chronic pain syndrome: Secondary | ICD-10-CM | POA: Diagnosis not present

## 2023-05-08 DIAGNOSIS — E559 Vitamin D deficiency, unspecified: Secondary | ICD-10-CM | POA: Diagnosis not present

## 2023-05-08 DIAGNOSIS — L400 Psoriasis vulgaris: Secondary | ICD-10-CM | POA: Diagnosis not present

## 2023-05-08 DIAGNOSIS — G43909 Migraine, unspecified, not intractable, without status migrainosus: Secondary | ICD-10-CM | POA: Diagnosis not present

## 2023-05-08 DIAGNOSIS — G629 Polyneuropathy, unspecified: Secondary | ICD-10-CM | POA: Diagnosis not present

## 2023-05-08 DIAGNOSIS — J45901 Unspecified asthma with (acute) exacerbation: Secondary | ICD-10-CM | POA: Diagnosis not present

## 2023-05-08 DIAGNOSIS — I872 Venous insufficiency (chronic) (peripheral): Secondary | ICD-10-CM | POA: Diagnosis not present

## 2023-05-08 DIAGNOSIS — G9529 Other cord compression: Secondary | ICD-10-CM | POA: Diagnosis not present

## 2023-05-08 DIAGNOSIS — M4802 Spinal stenosis, cervical region: Secondary | ICD-10-CM | POA: Diagnosis not present

## 2023-05-08 DIAGNOSIS — Z7951 Long term (current) use of inhaled steroids: Secondary | ICD-10-CM | POA: Diagnosis not present

## 2023-05-08 DIAGNOSIS — I5033 Acute on chronic diastolic (congestive) heart failure: Secondary | ICD-10-CM | POA: Diagnosis not present

## 2023-05-08 DIAGNOSIS — K219 Gastro-esophageal reflux disease without esophagitis: Secondary | ICD-10-CM | POA: Diagnosis not present

## 2023-05-08 DIAGNOSIS — Z87440 Personal history of urinary (tract) infections: Secondary | ICD-10-CM | POA: Diagnosis not present

## 2023-05-08 DIAGNOSIS — J9601 Acute respiratory failure with hypoxia: Secondary | ICD-10-CM | POA: Diagnosis not present

## 2023-05-08 NOTE — Telephone Encounter (Signed)
Copied from CRM [id number]. Topic: Clinical - Medical Advice >> May 08, 2023  4:33 PM Tiffany H wrote: Reason for CRM: Patient advised that she's had a bacteria resistant UTI since her last round of antibiotics. Patient advised that she has some concerns about the urine sample she'll have to provide for the next round of antibiotics. She is concerned that she won't be able to get treatment before [facility]/office closes for the holidays.

## 2023-05-09 DIAGNOSIS — J45901 Unspecified asthma with (acute) exacerbation: Secondary | ICD-10-CM | POA: Diagnosis not present

## 2023-05-09 DIAGNOSIS — Z87440 Personal history of urinary (tract) infections: Secondary | ICD-10-CM | POA: Diagnosis not present

## 2023-05-09 DIAGNOSIS — E559 Vitamin D deficiency, unspecified: Secondary | ICD-10-CM | POA: Diagnosis not present

## 2023-05-09 DIAGNOSIS — M4802 Spinal stenosis, cervical region: Secondary | ICD-10-CM | POA: Diagnosis not present

## 2023-05-09 DIAGNOSIS — G894 Chronic pain syndrome: Secondary | ICD-10-CM | POA: Diagnosis not present

## 2023-05-09 DIAGNOSIS — I872 Venous insufficiency (chronic) (peripheral): Secondary | ICD-10-CM | POA: Diagnosis not present

## 2023-05-09 DIAGNOSIS — G9529 Other cord compression: Secondary | ICD-10-CM | POA: Diagnosis not present

## 2023-05-09 DIAGNOSIS — I5033 Acute on chronic diastolic (congestive) heart failure: Secondary | ICD-10-CM | POA: Diagnosis not present

## 2023-05-09 DIAGNOSIS — I110 Hypertensive heart disease with heart failure: Secondary | ICD-10-CM | POA: Diagnosis not present

## 2023-05-09 DIAGNOSIS — E785 Hyperlipidemia, unspecified: Secondary | ICD-10-CM | POA: Diagnosis not present

## 2023-05-09 DIAGNOSIS — G4733 Obstructive sleep apnea (adult) (pediatric): Secondary | ICD-10-CM | POA: Diagnosis not present

## 2023-05-09 DIAGNOSIS — G629 Polyneuropathy, unspecified: Secondary | ICD-10-CM | POA: Diagnosis not present

## 2023-05-09 DIAGNOSIS — G43909 Migraine, unspecified, not intractable, without status migrainosus: Secondary | ICD-10-CM | POA: Diagnosis not present

## 2023-05-09 DIAGNOSIS — K219 Gastro-esophageal reflux disease without esophagitis: Secondary | ICD-10-CM | POA: Diagnosis not present

## 2023-05-09 DIAGNOSIS — Z7951 Long term (current) use of inhaled steroids: Secondary | ICD-10-CM | POA: Diagnosis not present

## 2023-05-09 DIAGNOSIS — M797 Fibromyalgia: Secondary | ICD-10-CM | POA: Diagnosis not present

## 2023-05-09 DIAGNOSIS — J9601 Acute respiratory failure with hypoxia: Secondary | ICD-10-CM | POA: Diagnosis not present

## 2023-05-09 DIAGNOSIS — K760 Fatty (change of) liver, not elsewhere classified: Secondary | ICD-10-CM | POA: Diagnosis not present

## 2023-05-09 DIAGNOSIS — L400 Psoriasis vulgaris: Secondary | ICD-10-CM | POA: Diagnosis not present

## 2023-05-09 DIAGNOSIS — M300 Polyarteritis nodosa: Secondary | ICD-10-CM | POA: Diagnosis not present

## 2023-05-09 DIAGNOSIS — E538 Deficiency of other specified B group vitamins: Secondary | ICD-10-CM | POA: Diagnosis not present

## 2023-05-09 NOTE — Telephone Encounter (Signed)
I don't have any information about a urinary tract infection.  Her last urine sample on 12/11 was normal.  I told her that I didn't want to prescribe antibiotics without knowing what I am treating to avoid causing any antibiotic resistance (especially since she has multiple drug allergies).  Home health was supposed to collect a UA and culture from pt prior to treatment.

## 2023-05-11 ENCOUNTER — Encounter: Payer: Self-pay | Admitting: Family Medicine

## 2023-05-11 DIAGNOSIS — I5033 Acute on chronic diastolic (congestive) heart failure: Secondary | ICD-10-CM | POA: Diagnosis not present

## 2023-05-11 DIAGNOSIS — M300 Polyarteritis nodosa: Secondary | ICD-10-CM | POA: Diagnosis not present

## 2023-05-11 DIAGNOSIS — E559 Vitamin D deficiency, unspecified: Secondary | ICD-10-CM | POA: Diagnosis not present

## 2023-05-11 DIAGNOSIS — J45901 Unspecified asthma with (acute) exacerbation: Secondary | ICD-10-CM | POA: Diagnosis not present

## 2023-05-11 DIAGNOSIS — K760 Fatty (change of) liver, not elsewhere classified: Secondary | ICD-10-CM | POA: Diagnosis not present

## 2023-05-11 DIAGNOSIS — I110 Hypertensive heart disease with heart failure: Secondary | ICD-10-CM | POA: Diagnosis not present

## 2023-05-11 DIAGNOSIS — G9529 Other cord compression: Secondary | ICD-10-CM | POA: Diagnosis not present

## 2023-05-11 DIAGNOSIS — G43909 Migraine, unspecified, not intractable, without status migrainosus: Secondary | ICD-10-CM | POA: Diagnosis not present

## 2023-05-11 DIAGNOSIS — I872 Venous insufficiency (chronic) (peripheral): Secondary | ICD-10-CM | POA: Diagnosis not present

## 2023-05-11 DIAGNOSIS — E538 Deficiency of other specified B group vitamins: Secondary | ICD-10-CM | POA: Diagnosis not present

## 2023-05-11 DIAGNOSIS — Z7951 Long term (current) use of inhaled steroids: Secondary | ICD-10-CM | POA: Diagnosis not present

## 2023-05-11 DIAGNOSIS — K219 Gastro-esophageal reflux disease without esophagitis: Secondary | ICD-10-CM | POA: Diagnosis not present

## 2023-05-11 DIAGNOSIS — G4733 Obstructive sleep apnea (adult) (pediatric): Secondary | ICD-10-CM | POA: Diagnosis not present

## 2023-05-11 DIAGNOSIS — M797 Fibromyalgia: Secondary | ICD-10-CM | POA: Diagnosis not present

## 2023-05-11 DIAGNOSIS — G894 Chronic pain syndrome: Secondary | ICD-10-CM | POA: Diagnosis not present

## 2023-05-11 DIAGNOSIS — Z87440 Personal history of urinary (tract) infections: Secondary | ICD-10-CM | POA: Diagnosis not present

## 2023-05-11 DIAGNOSIS — L400 Psoriasis vulgaris: Secondary | ICD-10-CM | POA: Diagnosis not present

## 2023-05-11 DIAGNOSIS — M4802 Spinal stenosis, cervical region: Secondary | ICD-10-CM | POA: Diagnosis not present

## 2023-05-11 DIAGNOSIS — G629 Polyneuropathy, unspecified: Secondary | ICD-10-CM | POA: Diagnosis not present

## 2023-05-11 DIAGNOSIS — E785 Hyperlipidemia, unspecified: Secondary | ICD-10-CM | POA: Diagnosis not present

## 2023-05-11 DIAGNOSIS — J9601 Acute respiratory failure with hypoxia: Secondary | ICD-10-CM | POA: Diagnosis not present

## 2023-05-11 MED ORDER — LEVOFLOXACIN 500 MG PO TABS: 500.0000 mg | ORAL_TABLET | Freq: Every day | ORAL | 0 refills | Status: AC

## 2023-05-11 NOTE — Telephone Encounter (Signed)
Scanned labs in chart reviewed,  urinalysis date of collection 05/06/2023.  Positive nitrite, positive leukocyte esterase, 1+ blood.  Greater than 30 WBC, 0-2 RBC, many bacteria.  0-10 epithelial.  Culture positive for Klebsiella aerogenes and Citrobacter koseri, greater than 100,000 colony-forming units of both.  Citrobacter pansensitive, Klebsiella sensitive to ertapenem, imipenem. Intermediate to levofloxacin, tobramycin, otherwise resistant.   Antibiotic allergies reviewed.  I do not see a quinolone allergy.  Will prescribe the Levaquin for now with close monitoring.  If not improving into next week may need ER evaluation for other treatments for multidrug-resistant Klebsiella.   Contacted patient. She has noted cramps at end of urination. Still some symptoms, no new abd/back pain, fever or chills. Discussed reasoning for this antibiotic as well as potential side effects and risks with quinolone antibiotics (including but not limited to tendinopathy, neuropathy, tendon rupture).  She has taken levaquin without issues in the past.  Understanding expressed.  Jeb Levering, with [facility]. Saw patient  today.  Has weekly visits.  She notes patient has c/o dysuria in past week. No flank pain, fever, chills have been reported.  I advised [name] of plan with Levaquin, close monitoring for symptom improvement, and ER precautions, as I advised Mrs. Guilliams.  To Dr. Beverely Low as Lorain Childes.

## 2023-05-11 NOTE — Telephone Encounter (Signed)
error 

## 2023-05-11 NOTE — Addendum Note (Signed)
Addended by: Meredith Staggers R on: 05/11/2023 04:57 PM   Modules accepted: Orders

## 2023-05-11 NOTE — Telephone Encounter (Signed)
Copied from CRM [id number]. Topic: Clinical - Lab/Test Results >> May 11, 2023  2:28 PM Gaetano Hawthorne wrote: Reason for CRM: Home Healthcare nurse is calling regarding the Urine culture results to ensure that we received and ensure we have a plan of care as the results do show signs of bacteria. Please call nurse at [phone].

## 2023-05-13 DIAGNOSIS — M797 Fibromyalgia: Secondary | ICD-10-CM | POA: Diagnosis not present

## 2023-05-13 DIAGNOSIS — K219 Gastro-esophageal reflux disease without esophagitis: Secondary | ICD-10-CM | POA: Diagnosis not present

## 2023-05-13 DIAGNOSIS — E785 Hyperlipidemia, unspecified: Secondary | ICD-10-CM | POA: Diagnosis not present

## 2023-05-13 DIAGNOSIS — M300 Polyarteritis nodosa: Secondary | ICD-10-CM | POA: Diagnosis not present

## 2023-05-13 DIAGNOSIS — L400 Psoriasis vulgaris: Secondary | ICD-10-CM | POA: Diagnosis not present

## 2023-05-13 DIAGNOSIS — E559 Vitamin D deficiency, unspecified: Secondary | ICD-10-CM | POA: Diagnosis not present

## 2023-05-13 DIAGNOSIS — G43909 Migraine, unspecified, not intractable, without status migrainosus: Secondary | ICD-10-CM | POA: Diagnosis not present

## 2023-05-13 DIAGNOSIS — J45901 Unspecified asthma with (acute) exacerbation: Secondary | ICD-10-CM | POA: Diagnosis not present

## 2023-05-13 DIAGNOSIS — M4802 Spinal stenosis, cervical region: Secondary | ICD-10-CM | POA: Diagnosis not present

## 2023-05-13 DIAGNOSIS — I872 Venous insufficiency (chronic) (peripheral): Secondary | ICD-10-CM | POA: Diagnosis not present

## 2023-05-13 DIAGNOSIS — Z87440 Personal history of urinary (tract) infections: Secondary | ICD-10-CM | POA: Diagnosis not present

## 2023-05-13 DIAGNOSIS — Z7951 Long term (current) use of inhaled steroids: Secondary | ICD-10-CM | POA: Diagnosis not present

## 2023-05-13 DIAGNOSIS — I5033 Acute on chronic diastolic (congestive) heart failure: Secondary | ICD-10-CM | POA: Diagnosis not present

## 2023-05-13 DIAGNOSIS — G629 Polyneuropathy, unspecified: Secondary | ICD-10-CM | POA: Diagnosis not present

## 2023-05-13 DIAGNOSIS — G4733 Obstructive sleep apnea (adult) (pediatric): Secondary | ICD-10-CM | POA: Diagnosis not present

## 2023-05-13 DIAGNOSIS — G9529 Other cord compression: Secondary | ICD-10-CM | POA: Diagnosis not present

## 2023-05-13 DIAGNOSIS — J9601 Acute respiratory failure with hypoxia: Secondary | ICD-10-CM | POA: Diagnosis not present

## 2023-05-13 DIAGNOSIS — G894 Chronic pain syndrome: Secondary | ICD-10-CM | POA: Diagnosis not present

## 2023-05-13 DIAGNOSIS — K760 Fatty (change of) liver, not elsewhere classified: Secondary | ICD-10-CM | POA: Diagnosis not present

## 2023-05-13 DIAGNOSIS — I110 Hypertensive heart disease with heart failure: Secondary | ICD-10-CM | POA: Diagnosis not present

## 2023-05-13 DIAGNOSIS — E538 Deficiency of other specified B group vitamins: Secondary | ICD-10-CM | POA: Diagnosis not present

## 2023-05-15 ENCOUNTER — Encounter: Payer: Self-pay | Admitting: Family Medicine

## 2023-05-15 ENCOUNTER — Telehealth (INDEPENDENT_AMBULATORY_CARE_PROVIDER_SITE_OTHER): Payer: 59 | Admitting: Family Medicine

## 2023-05-15 DIAGNOSIS — E785 Hyperlipidemia, unspecified: Secondary | ICD-10-CM | POA: Diagnosis not present

## 2023-05-15 DIAGNOSIS — Z87440 Personal history of urinary (tract) infections: Secondary | ICD-10-CM | POA: Diagnosis not present

## 2023-05-15 DIAGNOSIS — G9529 Other cord compression: Secondary | ICD-10-CM | POA: Diagnosis not present

## 2023-05-15 DIAGNOSIS — G43909 Migraine, unspecified, not intractable, without status migrainosus: Secondary | ICD-10-CM | POA: Diagnosis not present

## 2023-05-15 DIAGNOSIS — I110 Hypertensive heart disease with heart failure: Secondary | ICD-10-CM | POA: Diagnosis not present

## 2023-05-15 DIAGNOSIS — F32A Depression, unspecified: Secondary | ICD-10-CM | POA: Diagnosis not present

## 2023-05-15 DIAGNOSIS — F419 Anxiety disorder, unspecified: Secondary | ICD-10-CM | POA: Diagnosis not present

## 2023-05-15 DIAGNOSIS — L400 Psoriasis vulgaris: Secondary | ICD-10-CM | POA: Diagnosis not present

## 2023-05-15 DIAGNOSIS — G894 Chronic pain syndrome: Secondary | ICD-10-CM | POA: Diagnosis not present

## 2023-05-15 DIAGNOSIS — K760 Fatty (change of) liver, not elsewhere classified: Secondary | ICD-10-CM | POA: Diagnosis not present

## 2023-05-15 DIAGNOSIS — G4733 Obstructive sleep apnea (adult) (pediatric): Secondary | ICD-10-CM | POA: Diagnosis not present

## 2023-05-15 DIAGNOSIS — M797 Fibromyalgia: Secondary | ICD-10-CM | POA: Diagnosis not present

## 2023-05-15 DIAGNOSIS — J45901 Unspecified asthma with (acute) exacerbation: Secondary | ICD-10-CM | POA: Diagnosis not present

## 2023-05-15 DIAGNOSIS — M4802 Spinal stenosis, cervical region: Secondary | ICD-10-CM | POA: Diagnosis not present

## 2023-05-15 DIAGNOSIS — G629 Polyneuropathy, unspecified: Secondary | ICD-10-CM | POA: Diagnosis not present

## 2023-05-15 DIAGNOSIS — E538 Deficiency of other specified B group vitamins: Secondary | ICD-10-CM | POA: Diagnosis not present

## 2023-05-15 DIAGNOSIS — J9601 Acute respiratory failure with hypoxia: Secondary | ICD-10-CM | POA: Diagnosis not present

## 2023-05-15 DIAGNOSIS — E559 Vitamin D deficiency, unspecified: Secondary | ICD-10-CM | POA: Diagnosis not present

## 2023-05-15 DIAGNOSIS — K219 Gastro-esophageal reflux disease without esophagitis: Secondary | ICD-10-CM | POA: Diagnosis not present

## 2023-05-15 DIAGNOSIS — N390 Urinary tract infection, site not specified: Secondary | ICD-10-CM

## 2023-05-15 DIAGNOSIS — Z7951 Long term (current) use of inhaled steroids: Secondary | ICD-10-CM | POA: Diagnosis not present

## 2023-05-15 DIAGNOSIS — M300 Polyarteritis nodosa: Secondary | ICD-10-CM | POA: Diagnosis not present

## 2023-05-15 DIAGNOSIS — I872 Venous insufficiency (chronic) (peripheral): Secondary | ICD-10-CM | POA: Diagnosis not present

## 2023-05-15 DIAGNOSIS — I5033 Acute on chronic diastolic (congestive) heart failure: Secondary | ICD-10-CM | POA: Diagnosis not present

## 2023-05-15 NOTE — Telephone Encounter (Signed)
Discussed with patient on mychart video visit.

## 2023-05-15 NOTE — Progress Notes (Signed)
Virtual Visit via Video   I connected with patient on 05/15/23 at  2:40 PM EST by a video enabled telemedicine application and verified that I am speaking with the correct person using two identifiers.  Location patient: Home Location provider: [facility], Office Persons participating in the virtual visit: Patient, Provider, CMA (Yolanda C)  I discussed the limitations of evaluation and management by telemedicine and the availability of in person appointments. The patient expressed understanding and agreed to proceed.  Subjective:   HPI:   UTI- pt was started on Levaquin last week for resistant UTI.  Pt reports sxs are improving.    Anxiety- pt reports she has had worsening anxiety and near panic attacks.  Has not been taking the Aripiprazole.  Currently on Wellbutrin and the Cymbalta.  Has Abilify pills remaining.  Home care- [employer] called and indicated that they would cover a home BP cuff  ROS:   See pertinent positives and negatives per HPI.  Patient Active Problem List   Diagnosis Date Noted   Asthma, chronic, unspecified asthma severity, with acute exacerbation 01/12/2023   Hypomagnesemia 01/12/2023   Bedbound 12/14/2022   Acute on chronic heart failure with preserved ejection fraction (HFpEF) (HCC) 11/23/2022   Pressure injury of skin 11/23/2022   Nocturnal hypoxemia 06/21/2022   Elevated LFTs 05/06/2022   Obesity hypoventilation syndrome (HCC) 05/06/2022   Acute respiratory failure with hypoxia (HCC) 04/22/2022   Hypokalemia 04/22/2022   Leg cramps 01/12/2022   Fever of unknown origin 08/12/2021   Sacral pressure sore 07/06/2021   Vitamin B6 deficiency 06/08/2021   Myelomalacia of cervical cord (HCC) 06/06/2021   Compressive cervical cord myelomalacia (HCC) 06/05/2021   B12 deficiency 06/05/2021   Folate deficiency 06/05/2021   Vitamin D deficiency 06/05/2021   Leg weakness, bilateral 06/04/2021   Cervical atypia 01/06/2021   Steatosis of  liver 01/06/2021   Hyponatremia 07/27/2019   Urinary urgency 04/04/2018   Chronic narcotic use 09/07/2016   Acute blood loss anemia 01/14/2016   Multiple gastric ulcers    PAN (polyarteritis nodosa) (HCC) 11/24/2015   GERD (gastroesophageal reflux disease) 01/08/2014   Routine general medical examination at a health care facility 04/23/2013   Bariatric surgery status 10/31/2012   S/P skin biopsy 10/31/2012   Psoriasis 01/09/2012   DDD (degenerative disc disease), lumbar 11/30/2011   Migraine headache    [name] (obstructive sleep apnea) 11/16/2010   HYPERGLYCEMIA, FASTING 10/08/2009   CONTRACTURE OF TENDON 08/17/2009   PARESTHESIA, HANDS 12/23/2008   Leg pain, right 11/05/2008   ADVERSE DRUG REACTION 04/03/2008   PERIPHERAL EDEMA 03/26/2008   Obesity, morbid, BMI 50 or higher (HCC) 01/03/2007   Cellulitis 01/03/2007   Hyperlipidemia 09/22/2006   Fibromyalgia 09/20/2006   Anxiety and depression 06/28/2006   Essential hypertension 06/28/2006    Social History   Tobacco Use   Smoking status: Never   Smokeless tobacco: Never  Substance Use Topics   Alcohol use: No    Current Outpatient Medications:    ALPRAZolam (XANAX) 1 MG tablet, Take 1 tablet (1 mg total) by mouth 2 (two) times daily., Disp: 60 tablet, Rfl: 3   amoxicillin-clavulanate (AUGMENTIN) 875-125 MG tablet, Take 1 tablet by mouth 2 (two) times daily., Disp: 14 tablet, Rfl: 0   ARIPiprazole (ABILIFY) 15 MG tablet, TAKE 1 TABLET BY MOUTH EVERY DAY, Disp: 90 tablet, Rfl: 1   Armodafinil 250 MG tablet, Take 1 tablet (250 mg total) by mouth in the morning and at bedtime., Disp: 60 tablet,  Rfl: 3   Biotin 10 MG TABS, Take 1 tablet (10 mg total) by mouth in the morning., Disp: 90 tablet, Rfl: 1   buPROPion (WELLBUTRIN XL) 300 MG 24 hr tablet, TAKE 1 TABLET BY MOUTH EVERY DAY, Disp: 30 tablet, Rfl: 2   calcium citrate (CALCITRATE - DOSED IN MG ELEMENTAL CALCIUM) 950 (200 Ca) MG tablet, Take 1 tablet (200 mg of elemental  calcium total) by mouth daily., Disp: 90 tablet, Rfl: 1   clonazePAM (KLONOPIN) 1 MG tablet, TAKE 1 TABLET BY MOUTH AT BEDTIME, Disp: 30 tablet, Rfl: 3   cyanocobalamin (VITAMIN B12) 1000 MCG tablet, TAKE 1 TABLET BY MOUTH EVERY DAY, Disp: 30 tablet, Rfl: 2   cyclobenzaprine (FLEXERIL) 10 MG tablet, TAKE 2 TABLETS BY MOUTH 2 TIMES DAILY, Disp: 120 tablet, Rfl: 2   diclofenac Sodium (VOLTAREN) 1 % GEL, Apply 4 g topically 4 (four) times daily., Disp: 1 g, Rfl: 1   DULoxetine (CYMBALTA) 60 MG capsule, Take 2 capsules (120 mg total) by mouth daily. (Patient taking differently: Take 60 mg by mouth 2 (two) times daily. Pt reported taking two capsules(120mg ) once daily), Disp: 240 capsule, Rfl: 2   FEROSUL 325 (65 Fe) MG tablet, TAKE 1 TABLET BY MOUTH EVERY MORNING WITH BREAKFAST, Disp: 30 tablet, Rfl: 2   fluticasone-salmeterol (ADVAIR) 250-50 MCG/ACT AEPB, Inhale 1 puff into the lungs every 12 (twelve) hours., Disp: 60 each, Rfl: 5   folic acid (FOLVITE) 1 MG tablet, TAKE 1 TABLET BY MOUTH ONCE DAILY, Disp: 30 tablet, Rfl: 2   furosemide (LASIX) 40 MG tablet, Take 1 tablet by mouth daily. May take an additional tablet by mouth IF weight gain of 5 lbs and CALL MD for further instructions., Disp: 90 tablet, Rfl: 1   gabapentin (NEURONTIN) 600 MG tablet, Take 1 tablet (600 mg total) by mouth 2 (two) times daily as needed. Pt takes daily - can take up to twice daily, Disp: 60 tablet, Rfl: 1   HYDROcodone-acetaminophen (NORCO) 10-325 MG tablet, TAKE 1 TABLET BY MOUTH EVERY 8 HOURS AS NEEDED, Disp: 90 tablet, Rfl: 0   ipratropium-albuterol (DUONEB) 0.5-2.5 (3) MG/3ML SOLN, Take 3 mLs by nebulization every 6 (six) hours as needed (wheezing/shortness of breath)., Disp: 360 mL, Rfl: 0   levofloxacin (LEVAQUIN) 500 MG tablet, Take 1 tablet (500 mg total) by mouth daily for 7 days., Disp: 7 tablet, Rfl: 0   nebivolol (BYSTOLIC) 10 MG tablet, TAKE 1 TABLET BY MOUTH ONCE DAILY, Disp: 30 tablet, Rfl: 2   nystatin  (MYCOSTATIN/NYSTOP) powder, APPLY 1 APPLICATION TOPICALLY 3 TIMES DAILY (Patient taking differently: Apply 1 Application topically 3 (three) times daily as needed (yeast infection).), Disp: 60 g, Rfl: 3   Oxcarbazepine (TRILEPTAL) 300 MG tablet, Take 300 mg by mouth 2 (two) times daily., Disp: , Rfl:    pantoprazole (PROTONIX) 40 MG tablet, TAKE 1 TABLET BY MOUTH 2 TIMES DAILY, Disp: 60 tablet, Rfl: 2   phentermine 37.5 MG capsule, Take 1 capsule (37.5 mg total) by mouth every morning., Disp: 90 capsule, Rfl: 0   promethazine (PHENERGAN) 25 MG tablet, TAKE 1 TABLET BY MOUTH EVERY 6 HOURS AS NEEDED FOR NAUSEA AND VOMITING, Disp: 45 tablet, Rfl: 3   pyridOXINE (VITAMIN B6) 100 MG tablet, TAKE 1 TABLET BY MOUTH EVERY DAY, Disp: 30 tablet, Rfl: 2   Thiamine HCl (B-1) 100 MG TABS, TAKE 1 TABLET BY MOUTH EVERY DAY, Disp: 30 tablet, Rfl: 2   potassium chloride (KLOR-CON) 20 MEQ packet, Take 1 packet  by mouth daily. Take with lasix., Disp: 30 packet, Rfl: 1  Allergies  Allergen Reactions   Erythromycin Anaphylaxis   Niacin Anaphylaxis, Shortness Of Breath and Swelling    Swelling and "problems breathing"   Sulfamethoxazole-Trimethoprim Anaphylaxis, Swelling, Rash and Other (See Comments)    Throat closed and the eyes became swollen   Aspirin Hives   Bactrim Hives   Benzoin Compound Rash   Cephalexin Hives    Tolerated ceftriaxone and cefepime 07/2019   Iohexol Hives    Pt treated with PO benedryl   Lisinopril Hives   Naproxen Hives   Pnu-Imune [Pneumococcal Polysaccharide Vaccine] Rash   Sulfonamide Derivatives Rash    Objective:   There were no vitals taken for this visit.  AAOx3, NAD NCAT, EOMI No obvious CN deficits Coloring WNL Pt is able to speak clearly, coherently without shortness of breath or increased work of breathing.  Thought process is linear.  Mood is appropriate.   Assessment and Plan:   UTI- pt has resistant UTI and is now on Levaquin.  Pt reports sxs are improving.   Will continue to monitor.  Anxiety/Depression- deteriorated.  Pt had stopped her Abilify in hopes of decreasing pill burden but reports that mood rapidly deteriorated after stopping medication.  Pt has medication at home.  She will restart.  Pt expressed understanding and is in agreement w/ plan.    Neena Rhymes, MD 05/15/2023

## 2023-05-16 DIAGNOSIS — J45909 Unspecified asthma, uncomplicated: Secondary | ICD-10-CM | POA: Diagnosis not present

## 2023-05-17 DIAGNOSIS — M797 Fibromyalgia: Secondary | ICD-10-CM | POA: Diagnosis not present

## 2023-05-17 DIAGNOSIS — Z87440 Personal history of urinary (tract) infections: Secondary | ICD-10-CM | POA: Diagnosis not present

## 2023-05-17 DIAGNOSIS — G629 Polyneuropathy, unspecified: Secondary | ICD-10-CM | POA: Diagnosis not present

## 2023-05-17 DIAGNOSIS — G4733 Obstructive sleep apnea (adult) (pediatric): Secondary | ICD-10-CM | POA: Diagnosis not present

## 2023-05-17 DIAGNOSIS — G43909 Migraine, unspecified, not intractable, without status migrainosus: Secondary | ICD-10-CM | POA: Diagnosis not present

## 2023-05-17 DIAGNOSIS — E559 Vitamin D deficiency, unspecified: Secondary | ICD-10-CM | POA: Diagnosis not present

## 2023-05-17 DIAGNOSIS — E538 Deficiency of other specified B group vitamins: Secondary | ICD-10-CM | POA: Diagnosis not present

## 2023-05-17 DIAGNOSIS — G9529 Other cord compression: Secondary | ICD-10-CM | POA: Diagnosis not present

## 2023-05-17 DIAGNOSIS — J9601 Acute respiratory failure with hypoxia: Secondary | ICD-10-CM | POA: Diagnosis not present

## 2023-05-17 DIAGNOSIS — I110 Hypertensive heart disease with heart failure: Secondary | ICD-10-CM | POA: Diagnosis not present

## 2023-05-17 DIAGNOSIS — I5033 Acute on chronic diastolic (congestive) heart failure: Secondary | ICD-10-CM | POA: Diagnosis not present

## 2023-05-17 DIAGNOSIS — I872 Venous insufficiency (chronic) (peripheral): Secondary | ICD-10-CM | POA: Diagnosis not present

## 2023-05-17 DIAGNOSIS — K760 Fatty (change of) liver, not elsewhere classified: Secondary | ICD-10-CM | POA: Diagnosis not present

## 2023-05-17 DIAGNOSIS — M300 Polyarteritis nodosa: Secondary | ICD-10-CM | POA: Diagnosis not present

## 2023-05-17 DIAGNOSIS — K219 Gastro-esophageal reflux disease without esophagitis: Secondary | ICD-10-CM | POA: Diagnosis not present

## 2023-05-17 DIAGNOSIS — L400 Psoriasis vulgaris: Secondary | ICD-10-CM | POA: Diagnosis not present

## 2023-05-17 DIAGNOSIS — M4802 Spinal stenosis, cervical region: Secondary | ICD-10-CM | POA: Diagnosis not present

## 2023-05-17 DIAGNOSIS — G894 Chronic pain syndrome: Secondary | ICD-10-CM | POA: Diagnosis not present

## 2023-05-17 DIAGNOSIS — J45901 Unspecified asthma with (acute) exacerbation: Secondary | ICD-10-CM | POA: Diagnosis not present

## 2023-05-17 DIAGNOSIS — Z7951 Long term (current) use of inhaled steroids: Secondary | ICD-10-CM | POA: Diagnosis not present

## 2023-05-17 DIAGNOSIS — E785 Hyperlipidemia, unspecified: Secondary | ICD-10-CM | POA: Diagnosis not present

## 2023-05-19 DIAGNOSIS — G894 Chronic pain syndrome: Secondary | ICD-10-CM | POA: Diagnosis not present

## 2023-05-19 DIAGNOSIS — G629 Polyneuropathy, unspecified: Secondary | ICD-10-CM | POA: Diagnosis not present

## 2023-05-19 DIAGNOSIS — K760 Fatty (change of) liver, not elsewhere classified: Secondary | ICD-10-CM | POA: Diagnosis not present

## 2023-05-19 DIAGNOSIS — G43909 Migraine, unspecified, not intractable, without status migrainosus: Secondary | ICD-10-CM | POA: Diagnosis not present

## 2023-05-19 DIAGNOSIS — Z7951 Long term (current) use of inhaled steroids: Secondary | ICD-10-CM | POA: Diagnosis not present

## 2023-05-19 DIAGNOSIS — K219 Gastro-esophageal reflux disease without esophagitis: Secondary | ICD-10-CM | POA: Diagnosis not present

## 2023-05-19 DIAGNOSIS — L400 Psoriasis vulgaris: Secondary | ICD-10-CM | POA: Diagnosis not present

## 2023-05-19 DIAGNOSIS — Z87440 Personal history of urinary (tract) infections: Secondary | ICD-10-CM | POA: Diagnosis not present

## 2023-05-19 DIAGNOSIS — J9601 Acute respiratory failure with hypoxia: Secondary | ICD-10-CM | POA: Diagnosis not present

## 2023-05-19 DIAGNOSIS — G4733 Obstructive sleep apnea (adult) (pediatric): Secondary | ICD-10-CM | POA: Diagnosis not present

## 2023-05-19 DIAGNOSIS — J45901 Unspecified asthma with (acute) exacerbation: Secondary | ICD-10-CM | POA: Diagnosis not present

## 2023-05-19 DIAGNOSIS — M300 Polyarteritis nodosa: Secondary | ICD-10-CM | POA: Diagnosis not present

## 2023-05-19 DIAGNOSIS — G9529 Other cord compression: Secondary | ICD-10-CM | POA: Diagnosis not present

## 2023-05-19 DIAGNOSIS — E559 Vitamin D deficiency, unspecified: Secondary | ICD-10-CM | POA: Diagnosis not present

## 2023-05-19 DIAGNOSIS — I5033 Acute on chronic diastolic (congestive) heart failure: Secondary | ICD-10-CM | POA: Diagnosis not present

## 2023-05-19 DIAGNOSIS — M797 Fibromyalgia: Secondary | ICD-10-CM | POA: Diagnosis not present

## 2023-05-19 DIAGNOSIS — I872 Venous insufficiency (chronic) (peripheral): Secondary | ICD-10-CM | POA: Diagnosis not present

## 2023-05-19 DIAGNOSIS — E538 Deficiency of other specified B group vitamins: Secondary | ICD-10-CM | POA: Diagnosis not present

## 2023-05-19 DIAGNOSIS — M4802 Spinal stenosis, cervical region: Secondary | ICD-10-CM | POA: Diagnosis not present

## 2023-05-19 DIAGNOSIS — E785 Hyperlipidemia, unspecified: Secondary | ICD-10-CM | POA: Diagnosis not present

## 2023-05-19 DIAGNOSIS — I110 Hypertensive heart disease with heart failure: Secondary | ICD-10-CM | POA: Diagnosis not present

## 2023-05-22 DIAGNOSIS — I5033 Acute on chronic diastolic (congestive) heart failure: Secondary | ICD-10-CM | POA: Diagnosis not present

## 2023-05-22 DIAGNOSIS — M797 Fibromyalgia: Secondary | ICD-10-CM | POA: Diagnosis not present

## 2023-05-22 DIAGNOSIS — Z7951 Long term (current) use of inhaled steroids: Secondary | ICD-10-CM | POA: Diagnosis not present

## 2023-05-22 DIAGNOSIS — M4802 Spinal stenosis, cervical region: Secondary | ICD-10-CM | POA: Diagnosis not present

## 2023-05-22 DIAGNOSIS — J45901 Unspecified asthma with (acute) exacerbation: Secondary | ICD-10-CM | POA: Diagnosis not present

## 2023-05-22 DIAGNOSIS — G629 Polyneuropathy, unspecified: Secondary | ICD-10-CM | POA: Diagnosis not present

## 2023-05-22 DIAGNOSIS — K760 Fatty (change of) liver, not elsewhere classified: Secondary | ICD-10-CM | POA: Diagnosis not present

## 2023-05-22 DIAGNOSIS — E785 Hyperlipidemia, unspecified: Secondary | ICD-10-CM | POA: Diagnosis not present

## 2023-05-22 DIAGNOSIS — J9601 Acute respiratory failure with hypoxia: Secondary | ICD-10-CM | POA: Diagnosis not present

## 2023-05-22 DIAGNOSIS — G894 Chronic pain syndrome: Secondary | ICD-10-CM | POA: Diagnosis not present

## 2023-05-22 DIAGNOSIS — I110 Hypertensive heart disease with heart failure: Secondary | ICD-10-CM | POA: Diagnosis not present

## 2023-05-22 DIAGNOSIS — M300 Polyarteritis nodosa: Secondary | ICD-10-CM | POA: Diagnosis not present

## 2023-05-22 DIAGNOSIS — G9529 Other cord compression: Secondary | ICD-10-CM | POA: Diagnosis not present

## 2023-05-22 DIAGNOSIS — Z87440 Personal history of urinary (tract) infections: Secondary | ICD-10-CM | POA: Diagnosis not present

## 2023-05-22 DIAGNOSIS — E538 Deficiency of other specified B group vitamins: Secondary | ICD-10-CM | POA: Diagnosis not present

## 2023-05-22 DIAGNOSIS — I872 Venous insufficiency (chronic) (peripheral): Secondary | ICD-10-CM | POA: Diagnosis not present

## 2023-05-22 DIAGNOSIS — G4733 Obstructive sleep apnea (adult) (pediatric): Secondary | ICD-10-CM | POA: Diagnosis not present

## 2023-05-22 DIAGNOSIS — E559 Vitamin D deficiency, unspecified: Secondary | ICD-10-CM | POA: Diagnosis not present

## 2023-05-22 DIAGNOSIS — L400 Psoriasis vulgaris: Secondary | ICD-10-CM | POA: Diagnosis not present

## 2023-05-22 DIAGNOSIS — K219 Gastro-esophageal reflux disease without esophagitis: Secondary | ICD-10-CM | POA: Diagnosis not present

## 2023-05-22 DIAGNOSIS — G43909 Migraine, unspecified, not intractable, without status migrainosus: Secondary | ICD-10-CM | POA: Diagnosis not present

## 2023-05-24 DIAGNOSIS — L400 Psoriasis vulgaris: Secondary | ICD-10-CM | POA: Diagnosis not present

## 2023-05-24 DIAGNOSIS — J9601 Acute respiratory failure with hypoxia: Secondary | ICD-10-CM | POA: Diagnosis not present

## 2023-05-24 DIAGNOSIS — E559 Vitamin D deficiency, unspecified: Secondary | ICD-10-CM | POA: Diagnosis not present

## 2023-05-24 DIAGNOSIS — G629 Polyneuropathy, unspecified: Secondary | ICD-10-CM | POA: Diagnosis not present

## 2023-05-24 DIAGNOSIS — I872 Venous insufficiency (chronic) (peripheral): Secondary | ICD-10-CM | POA: Diagnosis not present

## 2023-05-24 DIAGNOSIS — G9529 Other cord compression: Secondary | ICD-10-CM | POA: Diagnosis not present

## 2023-05-24 DIAGNOSIS — K760 Fatty (change of) liver, not elsewhere classified: Secondary | ICD-10-CM | POA: Diagnosis not present

## 2023-05-24 DIAGNOSIS — J45901 Unspecified asthma with (acute) exacerbation: Secondary | ICD-10-CM | POA: Diagnosis not present

## 2023-05-24 DIAGNOSIS — G4733 Obstructive sleep apnea (adult) (pediatric): Secondary | ICD-10-CM | POA: Diagnosis not present

## 2023-05-24 DIAGNOSIS — M797 Fibromyalgia: Secondary | ICD-10-CM | POA: Diagnosis not present

## 2023-05-24 DIAGNOSIS — E785 Hyperlipidemia, unspecified: Secondary | ICD-10-CM | POA: Diagnosis not present

## 2023-05-24 DIAGNOSIS — I5033 Acute on chronic diastolic (congestive) heart failure: Secondary | ICD-10-CM | POA: Diagnosis not present

## 2023-05-24 DIAGNOSIS — K219 Gastro-esophageal reflux disease without esophagitis: Secondary | ICD-10-CM | POA: Diagnosis not present

## 2023-05-24 DIAGNOSIS — M300 Polyarteritis nodosa: Secondary | ICD-10-CM | POA: Diagnosis not present

## 2023-05-24 DIAGNOSIS — E538 Deficiency of other specified B group vitamins: Secondary | ICD-10-CM | POA: Diagnosis not present

## 2023-05-24 DIAGNOSIS — G43909 Migraine, unspecified, not intractable, without status migrainosus: Secondary | ICD-10-CM | POA: Diagnosis not present

## 2023-05-24 DIAGNOSIS — I110 Hypertensive heart disease with heart failure: Secondary | ICD-10-CM | POA: Diagnosis not present

## 2023-05-24 DIAGNOSIS — Z87440 Personal history of urinary (tract) infections: Secondary | ICD-10-CM | POA: Diagnosis not present

## 2023-05-24 DIAGNOSIS — G894 Chronic pain syndrome: Secondary | ICD-10-CM | POA: Diagnosis not present

## 2023-05-24 DIAGNOSIS — Z7951 Long term (current) use of inhaled steroids: Secondary | ICD-10-CM | POA: Diagnosis not present

## 2023-05-24 DIAGNOSIS — M4802 Spinal stenosis, cervical region: Secondary | ICD-10-CM | POA: Diagnosis not present

## 2023-05-25 DIAGNOSIS — G4733 Obstructive sleep apnea (adult) (pediatric): Secondary | ICD-10-CM | POA: Diagnosis not present

## 2023-05-25 DIAGNOSIS — G629 Polyneuropathy, unspecified: Secondary | ICD-10-CM | POA: Diagnosis not present

## 2023-05-25 DIAGNOSIS — E785 Hyperlipidemia, unspecified: Secondary | ICD-10-CM | POA: Diagnosis not present

## 2023-05-25 DIAGNOSIS — M300 Polyarteritis nodosa: Secondary | ICD-10-CM | POA: Diagnosis not present

## 2023-05-25 DIAGNOSIS — G9529 Other cord compression: Secondary | ICD-10-CM | POA: Diagnosis not present

## 2023-05-25 DIAGNOSIS — G43909 Migraine, unspecified, not intractable, without status migrainosus: Secondary | ICD-10-CM | POA: Diagnosis not present

## 2023-05-25 DIAGNOSIS — Z7951 Long term (current) use of inhaled steroids: Secondary | ICD-10-CM | POA: Diagnosis not present

## 2023-05-25 DIAGNOSIS — L400 Psoriasis vulgaris: Secondary | ICD-10-CM | POA: Diagnosis not present

## 2023-05-25 DIAGNOSIS — J45901 Unspecified asthma with (acute) exacerbation: Secondary | ICD-10-CM | POA: Diagnosis not present

## 2023-05-25 DIAGNOSIS — G894 Chronic pain syndrome: Secondary | ICD-10-CM | POA: Diagnosis not present

## 2023-05-25 DIAGNOSIS — J9601 Acute respiratory failure with hypoxia: Secondary | ICD-10-CM | POA: Diagnosis not present

## 2023-05-25 DIAGNOSIS — E559 Vitamin D deficiency, unspecified: Secondary | ICD-10-CM | POA: Diagnosis not present

## 2023-05-25 DIAGNOSIS — I872 Venous insufficiency (chronic) (peripheral): Secondary | ICD-10-CM | POA: Diagnosis not present

## 2023-05-25 DIAGNOSIS — M4802 Spinal stenosis, cervical region: Secondary | ICD-10-CM | POA: Diagnosis not present

## 2023-05-25 DIAGNOSIS — E538 Deficiency of other specified B group vitamins: Secondary | ICD-10-CM | POA: Diagnosis not present

## 2023-05-25 DIAGNOSIS — I110 Hypertensive heart disease with heart failure: Secondary | ICD-10-CM | POA: Diagnosis not present

## 2023-05-25 DIAGNOSIS — K760 Fatty (change of) liver, not elsewhere classified: Secondary | ICD-10-CM | POA: Diagnosis not present

## 2023-05-25 DIAGNOSIS — Z87440 Personal history of urinary (tract) infections: Secondary | ICD-10-CM | POA: Diagnosis not present

## 2023-05-25 DIAGNOSIS — K219 Gastro-esophageal reflux disease without esophagitis: Secondary | ICD-10-CM | POA: Diagnosis not present

## 2023-05-25 DIAGNOSIS — I5033 Acute on chronic diastolic (congestive) heart failure: Secondary | ICD-10-CM | POA: Diagnosis not present

## 2023-05-25 DIAGNOSIS — M797 Fibromyalgia: Secondary | ICD-10-CM | POA: Diagnosis not present

## 2023-05-26 ENCOUNTER — Other Ambulatory Visit: Payer: Self-pay | Admitting: Family Medicine

## 2023-05-26 DIAGNOSIS — G9589 Other specified diseases of spinal cord: Secondary | ICD-10-CM

## 2023-05-29 ENCOUNTER — Encounter: Payer: Self-pay | Admitting: Family Medicine

## 2023-05-29 DIAGNOSIS — L400 Psoriasis vulgaris: Secondary | ICD-10-CM | POA: Diagnosis not present

## 2023-05-29 DIAGNOSIS — F32A Depression, unspecified: Secondary | ICD-10-CM

## 2023-05-29 DIAGNOSIS — M300 Polyarteritis nodosa: Secondary | ICD-10-CM | POA: Diagnosis not present

## 2023-05-29 DIAGNOSIS — G9529 Other cord compression: Secondary | ICD-10-CM | POA: Diagnosis not present

## 2023-05-29 DIAGNOSIS — Z87440 Personal history of urinary (tract) infections: Secondary | ICD-10-CM | POA: Diagnosis not present

## 2023-05-29 DIAGNOSIS — J45901 Unspecified asthma with (acute) exacerbation: Secondary | ICD-10-CM | POA: Diagnosis not present

## 2023-05-29 DIAGNOSIS — E785 Hyperlipidemia, unspecified: Secondary | ICD-10-CM | POA: Diagnosis not present

## 2023-05-29 DIAGNOSIS — J9601 Acute respiratory failure with hypoxia: Secondary | ICD-10-CM | POA: Diagnosis not present

## 2023-05-29 DIAGNOSIS — M797 Fibromyalgia: Secondary | ICD-10-CM | POA: Diagnosis not present

## 2023-05-29 DIAGNOSIS — G629 Polyneuropathy, unspecified: Secondary | ICD-10-CM | POA: Diagnosis not present

## 2023-05-29 DIAGNOSIS — I872 Venous insufficiency (chronic) (peripheral): Secondary | ICD-10-CM | POA: Diagnosis not present

## 2023-05-29 DIAGNOSIS — M4802 Spinal stenosis, cervical region: Secondary | ICD-10-CM | POA: Diagnosis not present

## 2023-05-29 DIAGNOSIS — E559 Vitamin D deficiency, unspecified: Secondary | ICD-10-CM | POA: Diagnosis not present

## 2023-05-29 DIAGNOSIS — K219 Gastro-esophageal reflux disease without esophagitis: Secondary | ICD-10-CM | POA: Diagnosis not present

## 2023-05-29 DIAGNOSIS — I110 Hypertensive heart disease with heart failure: Secondary | ICD-10-CM | POA: Diagnosis not present

## 2023-05-29 DIAGNOSIS — G4733 Obstructive sleep apnea (adult) (pediatric): Secondary | ICD-10-CM | POA: Diagnosis not present

## 2023-05-29 DIAGNOSIS — E538 Deficiency of other specified B group vitamins: Secondary | ICD-10-CM | POA: Diagnosis not present

## 2023-05-29 DIAGNOSIS — Z7951 Long term (current) use of inhaled steroids: Secondary | ICD-10-CM | POA: Diagnosis not present

## 2023-05-29 DIAGNOSIS — G43909 Migraine, unspecified, not intractable, without status migrainosus: Secondary | ICD-10-CM | POA: Diagnosis not present

## 2023-05-29 DIAGNOSIS — K760 Fatty (change of) liver, not elsewhere classified: Secondary | ICD-10-CM | POA: Diagnosis not present

## 2023-05-29 DIAGNOSIS — G894 Chronic pain syndrome: Secondary | ICD-10-CM | POA: Diagnosis not present

## 2023-05-29 DIAGNOSIS — I5033 Acute on chronic diastolic (congestive) heart failure: Secondary | ICD-10-CM | POA: Diagnosis not present

## 2023-05-30 DIAGNOSIS — E785 Hyperlipidemia, unspecified: Secondary | ICD-10-CM | POA: Diagnosis not present

## 2023-05-30 DIAGNOSIS — M797 Fibromyalgia: Secondary | ICD-10-CM | POA: Diagnosis not present

## 2023-05-30 DIAGNOSIS — I5033 Acute on chronic diastolic (congestive) heart failure: Secondary | ICD-10-CM | POA: Diagnosis not present

## 2023-05-30 DIAGNOSIS — G629 Polyneuropathy, unspecified: Secondary | ICD-10-CM | POA: Diagnosis not present

## 2023-05-30 DIAGNOSIS — K760 Fatty (change of) liver, not elsewhere classified: Secondary | ICD-10-CM | POA: Diagnosis not present

## 2023-05-30 DIAGNOSIS — M4802 Spinal stenosis, cervical region: Secondary | ICD-10-CM | POA: Diagnosis not present

## 2023-05-30 DIAGNOSIS — I872 Venous insufficiency (chronic) (peripheral): Secondary | ICD-10-CM | POA: Diagnosis not present

## 2023-05-30 DIAGNOSIS — M300 Polyarteritis nodosa: Secondary | ICD-10-CM | POA: Diagnosis not present

## 2023-05-30 DIAGNOSIS — E559 Vitamin D deficiency, unspecified: Secondary | ICD-10-CM | POA: Diagnosis not present

## 2023-05-30 DIAGNOSIS — J45901 Unspecified asthma with (acute) exacerbation: Secondary | ICD-10-CM | POA: Diagnosis not present

## 2023-05-30 DIAGNOSIS — J9601 Acute respiratory failure with hypoxia: Secondary | ICD-10-CM | POA: Diagnosis not present

## 2023-05-30 DIAGNOSIS — G43909 Migraine, unspecified, not intractable, without status migrainosus: Secondary | ICD-10-CM | POA: Diagnosis not present

## 2023-05-30 DIAGNOSIS — G4733 Obstructive sleep apnea (adult) (pediatric): Secondary | ICD-10-CM | POA: Diagnosis not present

## 2023-05-30 DIAGNOSIS — E538 Deficiency of other specified B group vitamins: Secondary | ICD-10-CM | POA: Diagnosis not present

## 2023-05-30 DIAGNOSIS — K219 Gastro-esophageal reflux disease without esophagitis: Secondary | ICD-10-CM | POA: Diagnosis not present

## 2023-05-30 DIAGNOSIS — Z7951 Long term (current) use of inhaled steroids: Secondary | ICD-10-CM | POA: Diagnosis not present

## 2023-05-30 DIAGNOSIS — G894 Chronic pain syndrome: Secondary | ICD-10-CM | POA: Diagnosis not present

## 2023-05-30 DIAGNOSIS — G9529 Other cord compression: Secondary | ICD-10-CM | POA: Diagnosis not present

## 2023-05-30 DIAGNOSIS — Z87440 Personal history of urinary (tract) infections: Secondary | ICD-10-CM | POA: Diagnosis not present

## 2023-05-30 DIAGNOSIS — L400 Psoriasis vulgaris: Secondary | ICD-10-CM | POA: Diagnosis not present

## 2023-05-30 DIAGNOSIS — I110 Hypertensive heart disease with heart failure: Secondary | ICD-10-CM | POA: Diagnosis not present

## 2023-05-31 ENCOUNTER — Other Ambulatory Visit: Payer: Self-pay | Admitting: Family Medicine

## 2023-05-31 NOTE — Telephone Encounter (Signed)
 Pt requesting abx for cramping again please advise

## 2023-06-01 DIAGNOSIS — J45901 Unspecified asthma with (acute) exacerbation: Secondary | ICD-10-CM | POA: Diagnosis not present

## 2023-06-01 DIAGNOSIS — E559 Vitamin D deficiency, unspecified: Secondary | ICD-10-CM | POA: Diagnosis not present

## 2023-06-01 DIAGNOSIS — G9529 Other cord compression: Secondary | ICD-10-CM | POA: Diagnosis not present

## 2023-06-01 DIAGNOSIS — M797 Fibromyalgia: Secondary | ICD-10-CM | POA: Diagnosis not present

## 2023-06-01 DIAGNOSIS — E538 Deficiency of other specified B group vitamins: Secondary | ICD-10-CM | POA: Diagnosis not present

## 2023-06-01 DIAGNOSIS — J9601 Acute respiratory failure with hypoxia: Secondary | ICD-10-CM | POA: Diagnosis not present

## 2023-06-01 DIAGNOSIS — Z7951 Long term (current) use of inhaled steroids: Secondary | ICD-10-CM | POA: Diagnosis not present

## 2023-06-01 DIAGNOSIS — G4733 Obstructive sleep apnea (adult) (pediatric): Secondary | ICD-10-CM | POA: Diagnosis not present

## 2023-06-01 DIAGNOSIS — I110 Hypertensive heart disease with heart failure: Secondary | ICD-10-CM | POA: Diagnosis not present

## 2023-06-01 DIAGNOSIS — I5033 Acute on chronic diastolic (congestive) heart failure: Secondary | ICD-10-CM | POA: Diagnosis not present

## 2023-06-01 DIAGNOSIS — K219 Gastro-esophageal reflux disease without esophagitis: Secondary | ICD-10-CM | POA: Diagnosis not present

## 2023-06-01 DIAGNOSIS — L400 Psoriasis vulgaris: Secondary | ICD-10-CM | POA: Diagnosis not present

## 2023-06-01 DIAGNOSIS — E785 Hyperlipidemia, unspecified: Secondary | ICD-10-CM | POA: Diagnosis not present

## 2023-06-01 DIAGNOSIS — M4802 Spinal stenosis, cervical region: Secondary | ICD-10-CM | POA: Diagnosis not present

## 2023-06-01 DIAGNOSIS — M300 Polyarteritis nodosa: Secondary | ICD-10-CM | POA: Diagnosis not present

## 2023-06-01 DIAGNOSIS — G894 Chronic pain syndrome: Secondary | ICD-10-CM | POA: Diagnosis not present

## 2023-06-01 DIAGNOSIS — Z87440 Personal history of urinary (tract) infections: Secondary | ICD-10-CM | POA: Diagnosis not present

## 2023-06-01 DIAGNOSIS — I872 Venous insufficiency (chronic) (peripheral): Secondary | ICD-10-CM | POA: Diagnosis not present

## 2023-06-01 DIAGNOSIS — G43909 Migraine, unspecified, not intractable, without status migrainosus: Secondary | ICD-10-CM | POA: Diagnosis not present

## 2023-06-01 DIAGNOSIS — K760 Fatty (change of) liver, not elsewhere classified: Secondary | ICD-10-CM | POA: Diagnosis not present

## 2023-06-01 DIAGNOSIS — G629 Polyneuropathy, unspecified: Secondary | ICD-10-CM | POA: Diagnosis not present

## 2023-06-05 DIAGNOSIS — M4802 Spinal stenosis, cervical region: Secondary | ICD-10-CM | POA: Diagnosis not present

## 2023-06-05 DIAGNOSIS — E785 Hyperlipidemia, unspecified: Secondary | ICD-10-CM | POA: Diagnosis not present

## 2023-06-05 DIAGNOSIS — L400 Psoriasis vulgaris: Secondary | ICD-10-CM | POA: Diagnosis not present

## 2023-06-05 DIAGNOSIS — I110 Hypertensive heart disease with heart failure: Secondary | ICD-10-CM | POA: Diagnosis not present

## 2023-06-05 DIAGNOSIS — E538 Deficiency of other specified B group vitamins: Secondary | ICD-10-CM | POA: Diagnosis not present

## 2023-06-05 DIAGNOSIS — M300 Polyarteritis nodosa: Secondary | ICD-10-CM | POA: Diagnosis not present

## 2023-06-05 DIAGNOSIS — G894 Chronic pain syndrome: Secondary | ICD-10-CM | POA: Diagnosis not present

## 2023-06-05 DIAGNOSIS — Z87440 Personal history of urinary (tract) infections: Secondary | ICD-10-CM | POA: Diagnosis not present

## 2023-06-05 DIAGNOSIS — G629 Polyneuropathy, unspecified: Secondary | ICD-10-CM | POA: Diagnosis not present

## 2023-06-05 DIAGNOSIS — G43909 Migraine, unspecified, not intractable, without status migrainosus: Secondary | ICD-10-CM | POA: Diagnosis not present

## 2023-06-05 DIAGNOSIS — Z7951 Long term (current) use of inhaled steroids: Secondary | ICD-10-CM | POA: Diagnosis not present

## 2023-06-05 DIAGNOSIS — I872 Venous insufficiency (chronic) (peripheral): Secondary | ICD-10-CM | POA: Diagnosis not present

## 2023-06-05 DIAGNOSIS — M797 Fibromyalgia: Secondary | ICD-10-CM | POA: Diagnosis not present

## 2023-06-05 DIAGNOSIS — J45901 Unspecified asthma with (acute) exacerbation: Secondary | ICD-10-CM | POA: Diagnosis not present

## 2023-06-05 DIAGNOSIS — I5033 Acute on chronic diastolic (congestive) heart failure: Secondary | ICD-10-CM | POA: Diagnosis not present

## 2023-06-05 DIAGNOSIS — K219 Gastro-esophageal reflux disease without esophagitis: Secondary | ICD-10-CM | POA: Diagnosis not present

## 2023-06-05 DIAGNOSIS — E559 Vitamin D deficiency, unspecified: Secondary | ICD-10-CM | POA: Diagnosis not present

## 2023-06-05 DIAGNOSIS — K760 Fatty (change of) liver, not elsewhere classified: Secondary | ICD-10-CM | POA: Diagnosis not present

## 2023-06-05 DIAGNOSIS — G4733 Obstructive sleep apnea (adult) (pediatric): Secondary | ICD-10-CM | POA: Diagnosis not present

## 2023-06-05 DIAGNOSIS — Z79899 Other long term (current) drug therapy: Secondary | ICD-10-CM | POA: Diagnosis not present

## 2023-06-05 DIAGNOSIS — G9529 Other cord compression: Secondary | ICD-10-CM | POA: Diagnosis not present

## 2023-06-06 DIAGNOSIS — K219 Gastro-esophageal reflux disease without esophagitis: Secondary | ICD-10-CM | POA: Diagnosis not present

## 2023-06-06 DIAGNOSIS — G629 Polyneuropathy, unspecified: Secondary | ICD-10-CM | POA: Diagnosis not present

## 2023-06-06 DIAGNOSIS — Z7951 Long term (current) use of inhaled steroids: Secondary | ICD-10-CM | POA: Diagnosis not present

## 2023-06-06 DIAGNOSIS — G4733 Obstructive sleep apnea (adult) (pediatric): Secondary | ICD-10-CM | POA: Diagnosis not present

## 2023-06-06 DIAGNOSIS — I5033 Acute on chronic diastolic (congestive) heart failure: Secondary | ICD-10-CM | POA: Diagnosis not present

## 2023-06-06 DIAGNOSIS — E785 Hyperlipidemia, unspecified: Secondary | ICD-10-CM | POA: Diagnosis not present

## 2023-06-06 DIAGNOSIS — I872 Venous insufficiency (chronic) (peripheral): Secondary | ICD-10-CM | POA: Diagnosis not present

## 2023-06-06 DIAGNOSIS — M4802 Spinal stenosis, cervical region: Secondary | ICD-10-CM | POA: Diagnosis not present

## 2023-06-06 DIAGNOSIS — L400 Psoriasis vulgaris: Secondary | ICD-10-CM | POA: Diagnosis not present

## 2023-06-06 DIAGNOSIS — Z79899 Other long term (current) drug therapy: Secondary | ICD-10-CM | POA: Diagnosis not present

## 2023-06-06 DIAGNOSIS — G894 Chronic pain syndrome: Secondary | ICD-10-CM | POA: Diagnosis not present

## 2023-06-06 DIAGNOSIS — E538 Deficiency of other specified B group vitamins: Secondary | ICD-10-CM | POA: Diagnosis not present

## 2023-06-06 DIAGNOSIS — G43909 Migraine, unspecified, not intractable, without status migrainosus: Secondary | ICD-10-CM | POA: Diagnosis not present

## 2023-06-06 DIAGNOSIS — K760 Fatty (change of) liver, not elsewhere classified: Secondary | ICD-10-CM | POA: Diagnosis not present

## 2023-06-06 DIAGNOSIS — I110 Hypertensive heart disease with heart failure: Secondary | ICD-10-CM | POA: Diagnosis not present

## 2023-06-06 DIAGNOSIS — J45901 Unspecified asthma with (acute) exacerbation: Secondary | ICD-10-CM | POA: Diagnosis not present

## 2023-06-06 DIAGNOSIS — E559 Vitamin D deficiency, unspecified: Secondary | ICD-10-CM | POA: Diagnosis not present

## 2023-06-06 DIAGNOSIS — M797 Fibromyalgia: Secondary | ICD-10-CM | POA: Diagnosis not present

## 2023-06-06 DIAGNOSIS — M300 Polyarteritis nodosa: Secondary | ICD-10-CM | POA: Diagnosis not present

## 2023-06-06 DIAGNOSIS — G9529 Other cord compression: Secondary | ICD-10-CM | POA: Diagnosis not present

## 2023-06-06 DIAGNOSIS — Z87440 Personal history of urinary (tract) infections: Secondary | ICD-10-CM | POA: Diagnosis not present

## 2023-06-08 ENCOUNTER — Telehealth: Payer: Self-pay

## 2023-06-08 NOTE — Telephone Encounter (Addendum)
Home Health Verbal Orders  Agency: [facility]  Caller: (Contact and title) Call back #: [phone] Fax #:[phone]    Requesting OT/ PT/ Skilled nursing/ Social Work/ Speech: PT once a week for 2 weeks  Reason for Request: Tresa Endo provided verbal ok on 06/06/23. Form needing PCP signature and fax back. Placed in provider bin  Frequency: 2 weeks   HH needs F2F w/in last 30 days

## 2023-06-08 NOTE — Telephone Encounter (Signed)
Placed in folder at nurse station

## 2023-06-09 ENCOUNTER — Telehealth: Payer: Self-pay | Admitting: Family Medicine

## 2023-06-09 DIAGNOSIS — R339 Retention of urine, unspecified: Secondary | ICD-10-CM | POA: Diagnosis not present

## 2023-06-09 NOTE — Telephone Encounter (Signed)
Home Health Certification or Plan of Care Tracking  Is this a Certification or Plan of Care? Plan of Care (2 orders paperclipped together)  [facility] Agency: [facility]  Order Number:  [id number] and [id number]  Has charge sheet been attached? Yes  Where has form been placed:   Labeled & placed in provider bin

## 2023-06-09 NOTE — Telephone Encounter (Signed)
Placed in folder at nurse station

## 2023-06-09 NOTE — Telephone Encounter (Signed)
Signed and returned to [place]

## 2023-06-12 ENCOUNTER — Telehealth: Payer: Self-pay | Admitting: Family Medicine

## 2023-06-12 DIAGNOSIS — E559 Vitamin D deficiency, unspecified: Secondary | ICD-10-CM | POA: Diagnosis not present

## 2023-06-12 DIAGNOSIS — L400 Psoriasis vulgaris: Secondary | ICD-10-CM | POA: Diagnosis not present

## 2023-06-12 DIAGNOSIS — G629 Polyneuropathy, unspecified: Secondary | ICD-10-CM | POA: Diagnosis not present

## 2023-06-12 DIAGNOSIS — G4733 Obstructive sleep apnea (adult) (pediatric): Secondary | ICD-10-CM | POA: Diagnosis not present

## 2023-06-12 DIAGNOSIS — G43909 Migraine, unspecified, not intractable, without status migrainosus: Secondary | ICD-10-CM | POA: Diagnosis not present

## 2023-06-12 DIAGNOSIS — I110 Hypertensive heart disease with heart failure: Secondary | ICD-10-CM | POA: Diagnosis not present

## 2023-06-12 DIAGNOSIS — G9529 Other cord compression: Secondary | ICD-10-CM | POA: Diagnosis not present

## 2023-06-12 DIAGNOSIS — M4802 Spinal stenosis, cervical region: Secondary | ICD-10-CM | POA: Diagnosis not present

## 2023-06-12 DIAGNOSIS — K219 Gastro-esophageal reflux disease without esophagitis: Secondary | ICD-10-CM | POA: Diagnosis not present

## 2023-06-12 DIAGNOSIS — Z7951 Long term (current) use of inhaled steroids: Secondary | ICD-10-CM | POA: Diagnosis not present

## 2023-06-12 DIAGNOSIS — M300 Polyarteritis nodosa: Secondary | ICD-10-CM | POA: Diagnosis not present

## 2023-06-12 DIAGNOSIS — E538 Deficiency of other specified B group vitamins: Secondary | ICD-10-CM | POA: Diagnosis not present

## 2023-06-12 DIAGNOSIS — K760 Fatty (change of) liver, not elsewhere classified: Secondary | ICD-10-CM | POA: Diagnosis not present

## 2023-06-12 DIAGNOSIS — Z87440 Personal history of urinary (tract) infections: Secondary | ICD-10-CM | POA: Diagnosis not present

## 2023-06-12 DIAGNOSIS — I5033 Acute on chronic diastolic (congestive) heart failure: Secondary | ICD-10-CM | POA: Diagnosis not present

## 2023-06-12 DIAGNOSIS — G894 Chronic pain syndrome: Secondary | ICD-10-CM | POA: Diagnosis not present

## 2023-06-12 DIAGNOSIS — E785 Hyperlipidemia, unspecified: Secondary | ICD-10-CM | POA: Diagnosis not present

## 2023-06-12 DIAGNOSIS — M797 Fibromyalgia: Secondary | ICD-10-CM | POA: Diagnosis not present

## 2023-06-12 DIAGNOSIS — Z79899 Other long term (current) drug therapy: Secondary | ICD-10-CM | POA: Diagnosis not present

## 2023-06-12 DIAGNOSIS — I872 Venous insufficiency (chronic) (peripheral): Secondary | ICD-10-CM | POA: Diagnosis not present

## 2023-06-12 DIAGNOSIS — J45901 Unspecified asthma with (acute) exacerbation: Secondary | ICD-10-CM | POA: Diagnosis not present

## 2023-06-12 NOTE — Telephone Encounter (Signed)
Placed in folder at nurse station

## 2023-06-12 NOTE — Telephone Encounter (Signed)
Form completed and returned to [place]

## 2023-06-12 NOTE — Telephone Encounter (Signed)
Faxed and placed in scan bin

## 2023-06-12 NOTE — Telephone Encounter (Signed)
Type of form received: Orders  Additional comments:   Received by: [employer] - Front Desk  Form should be Faxed/mailed to: (address/ fax #) [phone]  Is patient requesting call for pickup: N/A  Form placed:  Labeled & placed in provider bin  Attach charge sheet.  Provider will determine charge.  Individual made aware of 3-5 business day turn around? N/A

## 2023-06-13 ENCOUNTER — Telehealth (HOSPITAL_BASED_OUTPATIENT_CLINIC_OR_DEPARTMENT_OTHER): Payer: 59 | Admitting: Pulmonary Disease

## 2023-06-13 ENCOUNTER — Encounter (HOSPITAL_BASED_OUTPATIENT_CLINIC_OR_DEPARTMENT_OTHER): Payer: Self-pay | Admitting: Pulmonary Disease

## 2023-06-13 DIAGNOSIS — J45909 Unspecified asthma, uncomplicated: Secondary | ICD-10-CM | POA: Diagnosis not present

## 2023-06-13 DIAGNOSIS — G4733 Obstructive sleep apnea (adult) (pediatric): Secondary | ICD-10-CM

## 2023-06-13 MED ORDER — FLUTICASONE-SALMETEROL 250-50 MCG/ACT IN AEPB: 1.0000 | INHALATION_SPRAY | Freq: Two times a day (BID) | RESPIRATORY_TRACT | 11 refills | Status: DC

## 2023-06-13 NOTE — Telephone Encounter (Signed)
Patient would like call back regarding her medical equipment

## 2023-06-13 NOTE — Telephone Encounter (Signed)
Faxed and placed in scan bin

## 2023-06-13 NOTE — Patient Instructions (Signed)
History of nocturnal hypoxemia likely 2/2 [name] Mild [name] --CONTINUE 1.5 L nightly --RE-ORDER home sleep study to re-qualify for CPAP --Continue working on weight loss  Asthma --CONTINUE Advair Diskus 250-50 mcg ONE puff in the morning and evening. Rinse mouth out after use --CONTINUE Albuterol AS NEEDED for shortness of breath or wheezing

## 2023-06-13 NOTE — Progress Notes (Signed)
Virtual Visit via Video Note  I connected with [name] on 06/13/23 at 11:30 AM EST by a video enabled telemedicine application and verified that I am speaking with the correct person using two identifiers.  Location: Patient: Home Provider: [facility]   I discussed the limitations of evaluation and management by telemedicine and the availability of in person appointments. The patient expressed understanding and agreed to proceed.   I discussed the assessment and treatment plan with the patient. The patient was provided an opportunity to ask questions and all were answered. The patient agreed with the plan and demonstrated an understanding of the instructions.   The patient was advised to call back or seek an in-person evaluation if the symptoms worsen or if the condition fails to improve as anticipated.   Subjective:   PATIENT ID: [name] GENDER: female DOB: [birth year], MRN: [id number]  Chief Complaint  Patient presents with   Follow-up    [name], asthma    Reason for Visit: Follow-up  [name] is a 53 year old female never smoker with bedbound status, morbid obesity, myelomalacia of cervical cord, [name], HTN, HLD, fibromyalgia, GERD who presents for [name] follow-up  Initial consult She was previously followed by Dr. Vassie Loll at [facility] with last visit with NP Clent Ridges on 07/10/19. She was recently hospitalized in December 2023 after being found down and treated with sepsis secondary to pneumonia. Did not need to be on ventilator.  She presents for renewal of CPAP and to re-establish care. She currently sleeps in a hospital bed and is reclined. Previously did not tolerate her CPAP mask but was able to wear it during her recent hospital stay for more than 4 hours. Son reports she gasps for air at night. She is short of breath at times and is non ambulatory at baseline. Denies wheezing or coughing.  10/27/22 Each night she would start wearing her CPAP however in  the middle of night she will self removal the mask without knowing. Her son tries to help and put it back on and she is too deeply asleep to notice. She is trying to be proactive and wearing it during the day in case she falls asleep during the day. Has not been able recently due to social situation at home and she has been awake all through the night. Father is now living in their home and having to monitor him due to his dementia. When she does wear her CPAP for at least 2 hours she reports benefit with quality of sleep and improved breathing.  She reports worsening shortness of breath in the last 2 months including with transitions between her walker and bed. Occasionally associated with wheezing.   06/13/23 Since our last visit her father has passed. She had called office in October 2024 regarding assistance with CPAP device but unfortunately CPAP device had to be returned due to nonadherence while out of town with her father and her own hospitalization. [employer] requested repeat sleep study since she did not meet compliance. Since our last visit she has lost 12 lbs. Since losing her CPAP she wears 1.5L O2 nightly which she feels is helping. Previously she had fatigue and gasping and shortness of braeth at night at least 2-3 times a week but this is less now compared with the oxygen. She feels her quality of sleep could be better.  She is on Advair 250 and albuterol inhaler and nebulizer as needed. Has been using rescue daily. Denies shortness  of breath, cough or wheezing. She was recently treated for UTI on 05/15/23 by PCP  Social History: Never smoker  Past Medical History:  Diagnosis Date   Allergic rhinitis    Ankle fracture, right    Anxiety    ASCUS of cervix with negative high risk HPV 05/2018   Carpal tunnel syndrome    Cervical cancer (HCC) 1998   Fibromyalgia    Hyperlipidemia    Hypertension    Migraine headache    Obesity    Sleep apnea      Family History  Problem Relation  Age of Onset   Diabetes Mother    Emphysema Mother    Allergies Mother    Hypertension Mother    Heart disease Father    Allergies Father    Prostate cancer Father    Breast cancer Maternal Grandmother 49     Social History   Occupational History   Occupation: disabled. prev worked as a Scientist, physiological  Tobacco Use   Smoking status: Never   Smokeless tobacco: Never  Vaping Use   Vaping status: Never Used  Substance and Sexual Activity   Alcohol use: No   Drug use: No   Sexual activity: Yes    Comment: TAH-1st intercourse 17 yo-5 partners    Allergies  Allergen Reactions   Erythromycin Anaphylaxis   Niacin Anaphylaxis, Shortness Of Breath and Swelling    Swelling and "problems breathing"   Sulfamethoxazole-Trimethoprim Anaphylaxis, Swelling, Rash and Other (See Comments)    Throat closed and the eyes became swollen   Aspirin Hives   Bactrim Hives   Benzoin Compound Rash   Cephalexin Hives    Tolerated ceftriaxone and cefepime 07/2019   Iohexol Hives    Pt treated with PO benedryl   Lisinopril Hives   Naproxen Hives   Pnu-Imune [Pneumococcal Polysaccharide Vaccine] Rash   Sulfonamide Derivatives Rash     Outpatient Medications Prior to Visit  Medication Sig Dispense Refill   ALPRAZolam (XANAX) 1 MG tablet Take 1 tablet (1 mg total) by mouth 2 (two) times daily. 60 tablet 3   ARIPiprazole (ABILIFY) 15 MG tablet TAKE 1 TABLET BY MOUTH EVERY DAY 90 tablet 1   Armodafinil 250 MG tablet Take 1 tablet (250 mg total) by mouth in the morning and at bedtime. 60 tablet 3   Biotin 10 MG TABS Take 1 tablet (10 mg total) by mouth in the morning. 90 tablet 1   buPROPion (WELLBUTRIN XL) 300 MG 24 hr tablet TAKE 1 TABLET BY MOUTH EVERY DAY 30 tablet 2   calcium citrate (CALCITRATE - DOSED IN MG ELEMENTAL CALCIUM) 950 (200 Ca) MG tablet Take 1 tablet (200 mg of elemental calcium total) by mouth daily. 90 tablet 1   clonazePAM (KLONOPIN) 1 MG tablet TAKE 1 TABLET BY MOUTH AT BEDTIME 30  tablet 3   cyanocobalamin (VITAMIN B12) 1000 MCG tablet TAKE 1 TABLET BY MOUTH ONCE DAILY 30 tablet 2   cyclobenzaprine (FLEXERIL) 10 MG tablet TAKE 2 TABLETS BY MOUTH 2 TIMES DAILY 120 tablet 2   diclofenac Sodium (VOLTAREN) 1 % GEL Apply 4 g topically 4 (four) times daily. 1 g 1   DULoxetine (CYMBALTA) 60 MG capsule Take 2 capsules (120 mg total) by mouth daily. (Patient taking differently: Take 60 mg by mouth 2 (two) times daily. Pt reported taking two capsules(120mg ) once daily) 240 capsule 2   FEROSUL 325 (65 Fe) MG tablet TAKE 1 TABLET BY MOUTH EVERY MORNING WITH BREAKFAST 30 tablet 2  fluticasone-salmeterol (ADVAIR) 250-50 MCG/ACT AEPB Inhale 1 puff into the lungs every 12 (twelve) hours. 60 each 5   folic acid (FOLVITE) 1 MG tablet TAKE 1 TABLET BY MOUTH ONCE DAILY 30 tablet 2   furosemide (LASIX) 40 MG tablet Take 1 tablet by mouth daily. May take an additional tablet by mouth IF weight gain of 5 lbs and CALL MD for further instructions. 90 tablet 1   gabapentin (NEURONTIN) 600 MG tablet Take 1 tablet (600 mg total) by mouth 2 (two) times daily as needed. Pt takes daily - can take up to twice daily 60 tablet 1   GNP VITAMIN B-1 100 MG tablet TAKE 1 TABLET BY MOUTH EVERY DAY 30 tablet 2   HYDROcodone-acetaminophen (NORCO) 10-325 MG tablet TAKE 1 TABLET BY MOUTH EVERY 8 HOURS AS NEEDED 90 tablet 0   ipratropium-albuterol (DUONEB) 0.5-2.5 (3) MG/3ML SOLN Take 3 mLs by nebulization every 6 (six) hours as needed (wheezing/shortness of breath). 360 mL 0   nebivolol (BYSTOLIC) 10 MG tablet TAKE 1 TABLET BY MOUTH EVERY DAY 30 tablet 2   nystatin (MYCOSTATIN/NYSTOP) powder APPLY 1 APPLICATION TOPICALLY 3 TIMES DAILY (Patient taking differently: Apply 1 Application topically 3 (three) times daily as needed (yeast infection).) 60 g 3   Oxcarbazepine (TRILEPTAL) 300 MG tablet Take 300 mg by mouth 2 (two) times daily.     pantoprazole (PROTONIX) 40 MG tablet TAKE 1 TABLET BY MOUTH 2 TIMES DAILY 60  tablet 2   phentermine 37.5 MG capsule Take 1 capsule (37.5 mg total) by mouth every morning. 90 capsule 0   potassium chloride (KLOR-CON) 20 MEQ packet Take 1 packet by mouth daily. Take with lasix. 30 packet 1   promethazine (PHENERGAN) 25 MG tablet TAKE 1 TABLET BY MOUTH EVERY 6 HOURS AS NEEDED FOR NAUSEA AND VOMITING 45 tablet 3   pyridOXINE (VITAMIN B6) 100 MG tablet TAKE 1 TABLET BY MOUTH ONCE DAILY 30 tablet 2   No facility-administered medications prior to visit.    Review of Systems  Constitutional:  Positive for malaise/fatigue. Negative for chills, diaphoresis, fever and weight loss.  HENT:  Negative for congestion.   Respiratory:  Positive for shortness of breath. Negative for cough, hemoptysis, sputum production and wheezing.   Cardiovascular:  Negative for chest pain, palpitations and leg swelling.     Objective:   There were no vitals filed for this visit.    Physical Exam: General: Morbidly obese-appearing, no acute distress HENT: [place], AT Eyes: EOMI, no scleral icterus Respiratory: No respiratory distress Neuro: AAO x4, CNII-XII grossly intact Psych: Normal mood, normal affect   Data Reviewed:  Imaging: CTA 04/21/22 - No PE. Bilateral GGO, bibasilar atelectasis. No effusion or edema. CXR 01/12/23 - Low lung volumes. Mild central pulmonary vascular congestion, elevated right hemidiaphragm with compressive atelectatic changes right right lung base  PFT: 02/21/11 FVC 2.45 (72%) FEV1 2.21 (84%) Ratio 87  TLC 80% DLCO 51% Interpretation: No obstructive or restrictive defect. Moderately reduced DLCO  Labs: CBC    Component Value Date/Time   WBC 7.1 01/13/2023 0227   RBC 4.84 01/13/2023 0227   HGB 13.7 01/13/2023 0227   HGB 13.9 11/03/2011 1036   HCT 45.0 01/13/2023 0227   HCT 42.8 11/03/2011 1036   PLT 212 01/13/2023 0227   PLT 271 11/03/2011 1036   MCV 93.0 01/13/2023 0227   MCV 89 11/03/2011 1036   MCH 28.3 01/13/2023 0227   MCHC 30.4 01/13/2023 0227    RDW 14.6 01/13/2023 0227  RDW 14.6 11/03/2011 1036   LYMPHSABS 1.3 01/12/2023 1611   LYMPHSABS 2.8 11/03/2011 1036   MONOABS 0.6 01/12/2023 1611   EOSABS 0.4 01/12/2023 1611   EOSABS 0.4 11/03/2011 1036   BASOSABS 0.0 01/12/2023 1611   BASOSABS 0.0 11/03/2011 1036   Abso eos 05/06/22 - 426  Sleep: 01/16/19 - Slept in recliner during study. Mild [name]. AHI 7.6/h Nadir SpO2 84%. Mean SpO2 94% Home sleep test 07/22/22 - AHI 11.4 Nadir SpO2 89%  CPAP compliance 09/18/22-10/17/22 Usage days 13/30 days (43%) >4 hours (0%) Avg usage 23 min    Assessment & Plan:   Discussion: 53 year old female never smoker with bedbound status, morbid obesity, myelomalacia of cervical cord, [name], HTN, HLD, fibromyalgia, GERD who presents for [name] follow-up.  In the fall of 2024 lost CPAP due to nonadherence due to extenuating circumstances involving monitoring her father with dementia and being out of town due to his passing. Patient interested in restarting CPAP if eligible. She has had 10 lbs weight loss since our last visit so will reassess on home sleep study. In the interim she was started on O2 by another provider and has been using this.   History of nocturnal hypoxemia likely 2/2 [name] Mild [name] --CONTINUE 1.5 L nightly --RE-ORDER home sleep study to re-qualify for CPAP --Continue working on weight loss  Asthma --CONTINUE Advair Diskus 250-50 mcg ONE puff in the morning and evening. Rinse mouth out after use --CONTINUE Albuterol AS NEEDED for shortness of breath or wheezing  Health Maintenance Immunization History  Administered Date(s) Administered   Influenza Split 03/17/2011   Influenza Whole 02/15/2010   Influenza,inj,Quad PF,6+ Mos 05/17/2015, 02/23/2016, 02/13/2017, 01/18/2019, 02/10/2021, 01/12/2022   Influenza-Unspecified 02/27/2013, 01/11/2018, 03/10/2020   PFIZER(Purple Top)SARS-COV-2 Vaccination 08/15/2019, 09/09/2019, 01/31/2020   Pfizer Covid-19 Vaccine Bivalent Booster 50yrs & up  03/11/2021   Pneumococcal Conjugate-13 10/04/2017   Pneumococcal Polysaccharide-23 03/17/2011, 01/10/2018   Td 05/16/2002   Td (Adult), 2 Lf Tetanus Toxid, Preservative Free 05/16/2002   Tdap 07/24/2018   CT Lung Screen - never smoker. Not qualified  Orders Placed This Encounter  Procedures   Home sleep test    Standing Status:   Future    Expiration Date:   06/12/2024    Where should this test be performed::   [facility]   No orders of the defined types were placed in this encounter.   Return for March.  I have spent a total time of 35-minutes on the day of the appointment including chart review, data review, collecting history, coordinating care and discussing medical diagnosis and plan with the patient/family. Past medical history, allergies, medications were reviewed. Pertinent imaging, labs and tests included in this note have been reviewed and interpreted independently by me.  Cantrell Larouche Mechele Collin, MD  Pulmonary Critical Care 06/13/2023 11:54 AM

## 2023-06-14 DIAGNOSIS — G629 Polyneuropathy, unspecified: Secondary | ICD-10-CM | POA: Diagnosis not present

## 2023-06-14 DIAGNOSIS — G43909 Migraine, unspecified, not intractable, without status migrainosus: Secondary | ICD-10-CM | POA: Diagnosis not present

## 2023-06-14 DIAGNOSIS — E785 Hyperlipidemia, unspecified: Secondary | ICD-10-CM | POA: Diagnosis not present

## 2023-06-14 DIAGNOSIS — K219 Gastro-esophageal reflux disease without esophagitis: Secondary | ICD-10-CM | POA: Diagnosis not present

## 2023-06-14 DIAGNOSIS — Z79899 Other long term (current) drug therapy: Secondary | ICD-10-CM | POA: Diagnosis not present

## 2023-06-14 DIAGNOSIS — Z87440 Personal history of urinary (tract) infections: Secondary | ICD-10-CM | POA: Diagnosis not present

## 2023-06-14 DIAGNOSIS — G4733 Obstructive sleep apnea (adult) (pediatric): Secondary | ICD-10-CM | POA: Diagnosis not present

## 2023-06-14 DIAGNOSIS — Z7951 Long term (current) use of inhaled steroids: Secondary | ICD-10-CM | POA: Diagnosis not present

## 2023-06-14 DIAGNOSIS — E538 Deficiency of other specified B group vitamins: Secondary | ICD-10-CM | POA: Diagnosis not present

## 2023-06-14 DIAGNOSIS — L400 Psoriasis vulgaris: Secondary | ICD-10-CM | POA: Diagnosis not present

## 2023-06-14 DIAGNOSIS — I110 Hypertensive heart disease with heart failure: Secondary | ICD-10-CM | POA: Diagnosis not present

## 2023-06-14 DIAGNOSIS — K760 Fatty (change of) liver, not elsewhere classified: Secondary | ICD-10-CM | POA: Diagnosis not present

## 2023-06-14 DIAGNOSIS — M797 Fibromyalgia: Secondary | ICD-10-CM | POA: Diagnosis not present

## 2023-06-14 DIAGNOSIS — I5033 Acute on chronic diastolic (congestive) heart failure: Secondary | ICD-10-CM | POA: Diagnosis not present

## 2023-06-14 DIAGNOSIS — I872 Venous insufficiency (chronic) (peripheral): Secondary | ICD-10-CM | POA: Diagnosis not present

## 2023-06-14 DIAGNOSIS — G894 Chronic pain syndrome: Secondary | ICD-10-CM | POA: Diagnosis not present

## 2023-06-14 DIAGNOSIS — G9529 Other cord compression: Secondary | ICD-10-CM | POA: Diagnosis not present

## 2023-06-14 DIAGNOSIS — M300 Polyarteritis nodosa: Secondary | ICD-10-CM | POA: Diagnosis not present

## 2023-06-14 DIAGNOSIS — J45901 Unspecified asthma with (acute) exacerbation: Secondary | ICD-10-CM | POA: Diagnosis not present

## 2023-06-14 DIAGNOSIS — E559 Vitamin D deficiency, unspecified: Secondary | ICD-10-CM | POA: Diagnosis not present

## 2023-06-14 DIAGNOSIS — M4802 Spinal stenosis, cervical region: Secondary | ICD-10-CM | POA: Diagnosis not present

## 2023-06-14 NOTE — Telephone Encounter (Signed)
I will look into this/call [employer] tomorrow first thing in the morning

## 2023-06-14 NOTE — Telephone Encounter (Signed)
I believe this is in regards to her power scooter.  Can we fax an order for a power scooter to [employer] since she never got one from the other company?

## 2023-06-16 ENCOUNTER — Other Ambulatory Visit: Payer: Self-pay | Admitting: Family Medicine

## 2023-06-16 DIAGNOSIS — J45909 Unspecified asthma, uncomplicated: Secondary | ICD-10-CM | POA: Diagnosis not present

## 2023-06-16 MED ORDER — ALPRAZOLAM 1 MG PO TABS: 1.0000 mg | ORAL_TABLET | Freq: Two times a day (BID) | ORAL | 3 refills | Status: DC

## 2023-06-16 MED ORDER — CLONAZEPAM 1 MG PO TABS: 1.0000 mg | ORAL_TABLET | Freq: Every day | ORAL | 3 refills | Status: DC

## 2023-06-16 NOTE — Telephone Encounter (Unsigned)
Copied from CRM [id number]. Topic: Clinical - Medical Advice >> Jun 16, 2023  2:33 PM Eunice Blase wrote: Reason for CRM: [facility] per Melody ph: [phone], fax: [phone] called to advise pt needs one of these medications, ALPRAZolam (XANAX) 1 MG tablet and clonazePAM (KLONOPIN) 1 MG tablet Please call.

## 2023-06-16 NOTE — Telephone Encounter (Signed)
I reached out to [employer] and they informed me that they no longer supply power scooters. I researched other DME companies and contacted [employer] and they also do not provide scooters any longer through insurance, they only have them for sale. I contacted patient's insurance company to see what DME company they use and they gave me [employer] and [employer]. I called [employer] and they too stopped providing power scooters but they offer power wheelchairs (like all the other) and they have a portable power wheelchair for patients under 285 lbs. I have let Dr. Beverely Low know this information and I have left Danai a message to call me back to let her know this info and see if she would like to moved forward with a power chair instead.

## 2023-06-16 NOTE — Telephone Encounter (Signed)
Requested Prescriptions   Pending Prescriptions Disp Refills   ALPRAZolam (XANAX) 1 MG tablet 60 tablet 3    Sig: Take 1 tablet (1 mg total) by mouth 2 (two) times daily.   clonazePAM (KLONOPIN) 1 MG tablet 30 tablet 3    Sig: Take 1 tablet (1 mg total) by mouth at bedtime.     Date of patient request: 06/16/2023 Last office visit: Visit date not found Upcoming visit: Visit date not found Date of last refill: 03/22/2023, 03/01/2023 Last refill amount: 60, 30

## 2023-06-16 NOTE — Telephone Encounter (Signed)
Copied from CRM [id number]. Topic: Clinical - Medical Advice >> Jun 16, 2023 12:10 PM Clayton Bibles wrote: Reason for CRM: This message is for [facility]. Please call Kashana back about electric wheelchair. Her number is [phone]

## 2023-06-16 NOTE — Addendum Note (Signed)
Addended by: Eldred Manges on: 06/16/2023 03:31 PM   Modules accepted: Orders

## 2023-06-19 ENCOUNTER — Other Ambulatory Visit: Payer: Self-pay

## 2023-06-19 ENCOUNTER — Ambulatory Visit (HOSPITAL_BASED_OUTPATIENT_CLINIC_OR_DEPARTMENT_OTHER): Payer: 59 | Admitting: Pulmonary Disease

## 2023-06-19 DIAGNOSIS — E785 Hyperlipidemia, unspecified: Secondary | ICD-10-CM | POA: Diagnosis not present

## 2023-06-19 DIAGNOSIS — G9589 Other specified diseases of spinal cord: Secondary | ICD-10-CM

## 2023-06-19 DIAGNOSIS — E559 Vitamin D deficiency, unspecified: Secondary | ICD-10-CM | POA: Diagnosis not present

## 2023-06-19 DIAGNOSIS — Z87440 Personal history of urinary (tract) infections: Secondary | ICD-10-CM | POA: Diagnosis not present

## 2023-06-19 DIAGNOSIS — K219 Gastro-esophageal reflux disease without esophagitis: Secondary | ICD-10-CM | POA: Diagnosis not present

## 2023-06-19 DIAGNOSIS — M797 Fibromyalgia: Secondary | ICD-10-CM | POA: Diagnosis not present

## 2023-06-19 DIAGNOSIS — E538 Deficiency of other specified B group vitamins: Secondary | ICD-10-CM | POA: Diagnosis not present

## 2023-06-19 DIAGNOSIS — G43909 Migraine, unspecified, not intractable, without status migrainosus: Secondary | ICD-10-CM | POA: Diagnosis not present

## 2023-06-19 DIAGNOSIS — Z7951 Long term (current) use of inhaled steroids: Secondary | ICD-10-CM | POA: Diagnosis not present

## 2023-06-19 DIAGNOSIS — M300 Polyarteritis nodosa: Secondary | ICD-10-CM | POA: Diagnosis not present

## 2023-06-19 DIAGNOSIS — M4802 Spinal stenosis, cervical region: Secondary | ICD-10-CM | POA: Diagnosis not present

## 2023-06-19 DIAGNOSIS — G894 Chronic pain syndrome: Secondary | ICD-10-CM | POA: Diagnosis not present

## 2023-06-19 DIAGNOSIS — K760 Fatty (change of) liver, not elsewhere classified: Secondary | ICD-10-CM | POA: Diagnosis not present

## 2023-06-19 DIAGNOSIS — Z79899 Other long term (current) drug therapy: Secondary | ICD-10-CM | POA: Diagnosis not present

## 2023-06-19 DIAGNOSIS — I872 Venous insufficiency (chronic) (peripheral): Secondary | ICD-10-CM | POA: Diagnosis not present

## 2023-06-19 DIAGNOSIS — I5033 Acute on chronic diastolic (congestive) heart failure: Secondary | ICD-10-CM | POA: Diagnosis not present

## 2023-06-19 DIAGNOSIS — G9529 Other cord compression: Secondary | ICD-10-CM | POA: Diagnosis not present

## 2023-06-19 DIAGNOSIS — J45901 Unspecified asthma with (acute) exacerbation: Secondary | ICD-10-CM | POA: Diagnosis not present

## 2023-06-19 DIAGNOSIS — G629 Polyneuropathy, unspecified: Secondary | ICD-10-CM | POA: Diagnosis not present

## 2023-06-19 DIAGNOSIS — I110 Hypertensive heart disease with heart failure: Secondary | ICD-10-CM | POA: Diagnosis not present

## 2023-06-19 DIAGNOSIS — G4733 Obstructive sleep apnea (adult) (pediatric): Secondary | ICD-10-CM | POA: Diagnosis not present

## 2023-06-19 DIAGNOSIS — L400 Psoriasis vulgaris: Secondary | ICD-10-CM | POA: Diagnosis not present

## 2023-06-19 NOTE — Telephone Encounter (Signed)
I attempted to call [name] and follow up on the power wheel chair and had to leave another voicemail. I also left a voicemail for her son to return my call. I also sent [name] a my chart message asking for her ok to proceed with getting her a chair.

## 2023-06-20 ENCOUNTER — Telehealth: Payer: Self-pay | Admitting: Family Medicine

## 2023-06-20 NOTE — Telephone Encounter (Signed)
Copied from CRM [id number]. Topic: General - Call Back - No Documentation >> Jun 20, 2023 10:22 AM Samuel Jester B wrote: Reason for CRM: Pt stated that she is needing a callback from [facility] the Print production planner.

## 2023-06-20 NOTE — Telephone Encounter (Signed)
 Addressed via [employer]

## 2023-06-20 NOTE — Telephone Encounter (Signed)
Called patient back and she stated that [facility] approved the hospital bed. They are just need a script for it. [name] said that she would send the fax number in a mychart message. I let her know I would get it and fax it!

## 2023-06-21 DIAGNOSIS — G894 Chronic pain syndrome: Secondary | ICD-10-CM | POA: Diagnosis not present

## 2023-06-21 DIAGNOSIS — E559 Vitamin D deficiency, unspecified: Secondary | ICD-10-CM | POA: Diagnosis not present

## 2023-06-21 DIAGNOSIS — E785 Hyperlipidemia, unspecified: Secondary | ICD-10-CM | POA: Diagnosis not present

## 2023-06-21 DIAGNOSIS — M300 Polyarteritis nodosa: Secondary | ICD-10-CM | POA: Diagnosis not present

## 2023-06-21 DIAGNOSIS — Z87440 Personal history of urinary (tract) infections: Secondary | ICD-10-CM | POA: Diagnosis not present

## 2023-06-21 DIAGNOSIS — M4802 Spinal stenosis, cervical region: Secondary | ICD-10-CM | POA: Diagnosis not present

## 2023-06-21 DIAGNOSIS — G43909 Migraine, unspecified, not intractable, without status migrainosus: Secondary | ICD-10-CM | POA: Diagnosis not present

## 2023-06-21 DIAGNOSIS — I5033 Acute on chronic diastolic (congestive) heart failure: Secondary | ICD-10-CM | POA: Diagnosis not present

## 2023-06-21 DIAGNOSIS — L400 Psoriasis vulgaris: Secondary | ICD-10-CM | POA: Diagnosis not present

## 2023-06-21 DIAGNOSIS — K760 Fatty (change of) liver, not elsewhere classified: Secondary | ICD-10-CM | POA: Diagnosis not present

## 2023-06-21 DIAGNOSIS — M797 Fibromyalgia: Secondary | ICD-10-CM | POA: Diagnosis not present

## 2023-06-21 DIAGNOSIS — I110 Hypertensive heart disease with heart failure: Secondary | ICD-10-CM | POA: Diagnosis not present

## 2023-06-21 DIAGNOSIS — E538 Deficiency of other specified B group vitamins: Secondary | ICD-10-CM | POA: Diagnosis not present

## 2023-06-21 DIAGNOSIS — G629 Polyneuropathy, unspecified: Secondary | ICD-10-CM | POA: Diagnosis not present

## 2023-06-21 DIAGNOSIS — K219 Gastro-esophageal reflux disease without esophagitis: Secondary | ICD-10-CM | POA: Diagnosis not present

## 2023-06-21 DIAGNOSIS — J45901 Unspecified asthma with (acute) exacerbation: Secondary | ICD-10-CM | POA: Diagnosis not present

## 2023-06-21 DIAGNOSIS — Z79899 Other long term (current) drug therapy: Secondary | ICD-10-CM | POA: Diagnosis not present

## 2023-06-21 DIAGNOSIS — G9529 Other cord compression: Secondary | ICD-10-CM | POA: Diagnosis not present

## 2023-06-21 DIAGNOSIS — I872 Venous insufficiency (chronic) (peripheral): Secondary | ICD-10-CM | POA: Diagnosis not present

## 2023-06-21 DIAGNOSIS — Z7951 Long term (current) use of inhaled steroids: Secondary | ICD-10-CM | POA: Diagnosis not present

## 2023-06-21 DIAGNOSIS — G4733 Obstructive sleep apnea (adult) (pediatric): Secondary | ICD-10-CM | POA: Diagnosis not present

## 2023-06-21 NOTE — Telephone Encounter (Signed)
Checked to see if patient had sent fax number and she hadn't. I called her for an update and she told me to send the hospital bed rx to [facility] at [phone] P: [phone]

## 2023-06-21 NOTE — Telephone Encounter (Signed)
 Ok to fax order for 'Fully electric hospital bed w/ gel overlay'

## 2023-06-22 ENCOUNTER — Other Ambulatory Visit: Payer: Self-pay

## 2023-06-22 ENCOUNTER — Ambulatory Visit: Payer: 59 | Admitting: Clinical

## 2023-06-22 DIAGNOSIS — F330 Major depressive disorder, recurrent, mild: Secondary | ICD-10-CM | POA: Diagnosis not present

## 2023-06-22 DIAGNOSIS — G4733 Obstructive sleep apnea (adult) (pediatric): Secondary | ICD-10-CM | POA: Diagnosis not present

## 2023-06-22 DIAGNOSIS — G629 Polyneuropathy, unspecified: Secondary | ICD-10-CM | POA: Diagnosis not present

## 2023-06-22 DIAGNOSIS — M4802 Spinal stenosis, cervical region: Secondary | ICD-10-CM | POA: Diagnosis not present

## 2023-06-22 DIAGNOSIS — G9529 Other cord compression: Secondary | ICD-10-CM | POA: Diagnosis not present

## 2023-06-22 DIAGNOSIS — E785 Hyperlipidemia, unspecified: Secondary | ICD-10-CM | POA: Diagnosis not present

## 2023-06-22 DIAGNOSIS — L400 Psoriasis vulgaris: Secondary | ICD-10-CM | POA: Diagnosis not present

## 2023-06-22 DIAGNOSIS — Z79899 Other long term (current) drug therapy: Secondary | ICD-10-CM | POA: Diagnosis not present

## 2023-06-22 DIAGNOSIS — Z87440 Personal history of urinary (tract) infections: Secondary | ICD-10-CM | POA: Diagnosis not present

## 2023-06-22 DIAGNOSIS — M300 Polyarteritis nodosa: Secondary | ICD-10-CM | POA: Diagnosis not present

## 2023-06-22 DIAGNOSIS — J45901 Unspecified asthma with (acute) exacerbation: Secondary | ICD-10-CM | POA: Diagnosis not present

## 2023-06-22 DIAGNOSIS — I110 Hypertensive heart disease with heart failure: Secondary | ICD-10-CM | POA: Diagnosis not present

## 2023-06-22 DIAGNOSIS — I872 Venous insufficiency (chronic) (peripheral): Secondary | ICD-10-CM | POA: Diagnosis not present

## 2023-06-22 DIAGNOSIS — M797 Fibromyalgia: Secondary | ICD-10-CM | POA: Diagnosis not present

## 2023-06-22 DIAGNOSIS — G894 Chronic pain syndrome: Secondary | ICD-10-CM | POA: Diagnosis not present

## 2023-06-22 DIAGNOSIS — F419 Anxiety disorder, unspecified: Secondary | ICD-10-CM

## 2023-06-22 DIAGNOSIS — K760 Fatty (change of) liver, not elsewhere classified: Secondary | ICD-10-CM | POA: Diagnosis not present

## 2023-06-22 DIAGNOSIS — E538 Deficiency of other specified B group vitamins: Secondary | ICD-10-CM | POA: Diagnosis not present

## 2023-06-22 DIAGNOSIS — G43909 Migraine, unspecified, not intractable, without status migrainosus: Secondary | ICD-10-CM | POA: Diagnosis not present

## 2023-06-22 DIAGNOSIS — K219 Gastro-esophageal reflux disease without esophagitis: Secondary | ICD-10-CM | POA: Diagnosis not present

## 2023-06-22 DIAGNOSIS — E559 Vitamin D deficiency, unspecified: Secondary | ICD-10-CM | POA: Diagnosis not present

## 2023-06-22 DIAGNOSIS — I5033 Acute on chronic diastolic (congestive) heart failure: Secondary | ICD-10-CM | POA: Diagnosis not present

## 2023-06-22 DIAGNOSIS — Z7951 Long term (current) use of inhaled steroids: Secondary | ICD-10-CM | POA: Diagnosis not present

## 2023-06-22 DIAGNOSIS — G9589 Other specified diseases of spinal cord: Secondary | ICD-10-CM

## 2023-06-22 NOTE — Progress Notes (Signed)
   Doree Barthel, LCSW

## 2023-06-22 NOTE — Telephone Encounter (Signed)
 Faxed

## 2023-06-22 NOTE — Progress Notes (Signed)
 Faxed hospital bed order to [facility] per patient request at [phone]
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
## 2023-06-23 ENCOUNTER — Telehealth: Payer: Self-pay

## 2023-06-23 DIAGNOSIS — I110 Hypertensive heart disease with heart failure: Secondary | ICD-10-CM | POA: Diagnosis not present

## 2023-06-23 DIAGNOSIS — G894 Chronic pain syndrome: Secondary | ICD-10-CM | POA: Diagnosis not present

## 2023-06-23 DIAGNOSIS — E559 Vitamin D deficiency, unspecified: Secondary | ICD-10-CM | POA: Diagnosis not present

## 2023-06-23 DIAGNOSIS — Z7951 Long term (current) use of inhaled steroids: Secondary | ICD-10-CM | POA: Diagnosis not present

## 2023-06-23 DIAGNOSIS — G9529 Other cord compression: Secondary | ICD-10-CM | POA: Diagnosis not present

## 2023-06-23 DIAGNOSIS — K219 Gastro-esophageal reflux disease without esophagitis: Secondary | ICD-10-CM | POA: Diagnosis not present

## 2023-06-23 DIAGNOSIS — Z79899 Other long term (current) drug therapy: Secondary | ICD-10-CM | POA: Diagnosis not present

## 2023-06-23 DIAGNOSIS — G43909 Migraine, unspecified, not intractable, without status migrainosus: Secondary | ICD-10-CM | POA: Diagnosis not present

## 2023-06-23 DIAGNOSIS — E538 Deficiency of other specified B group vitamins: Secondary | ICD-10-CM | POA: Diagnosis not present

## 2023-06-23 DIAGNOSIS — M300 Polyarteritis nodosa: Secondary | ICD-10-CM | POA: Diagnosis not present

## 2023-06-23 DIAGNOSIS — G4733 Obstructive sleep apnea (adult) (pediatric): Secondary | ICD-10-CM | POA: Diagnosis not present

## 2023-06-23 DIAGNOSIS — I872 Venous insufficiency (chronic) (peripheral): Secondary | ICD-10-CM | POA: Diagnosis not present

## 2023-06-23 DIAGNOSIS — E785 Hyperlipidemia, unspecified: Secondary | ICD-10-CM | POA: Diagnosis not present

## 2023-06-23 DIAGNOSIS — I5033 Acute on chronic diastolic (congestive) heart failure: Secondary | ICD-10-CM | POA: Diagnosis not present

## 2023-06-23 DIAGNOSIS — G629 Polyneuropathy, unspecified: Secondary | ICD-10-CM | POA: Diagnosis not present

## 2023-06-23 DIAGNOSIS — J45901 Unspecified asthma with (acute) exacerbation: Secondary | ICD-10-CM | POA: Diagnosis not present

## 2023-06-23 DIAGNOSIS — M4802 Spinal stenosis, cervical region: Secondary | ICD-10-CM | POA: Diagnosis not present

## 2023-06-23 DIAGNOSIS — Z87440 Personal history of urinary (tract) infections: Secondary | ICD-10-CM | POA: Diagnosis not present

## 2023-06-23 DIAGNOSIS — M797 Fibromyalgia: Secondary | ICD-10-CM | POA: Diagnosis not present

## 2023-06-23 DIAGNOSIS — K760 Fatty (change of) liver, not elsewhere classified: Secondary | ICD-10-CM | POA: Diagnosis not present

## 2023-06-23 DIAGNOSIS — L400 Psoriasis vulgaris: Secondary | ICD-10-CM | POA: Diagnosis not present

## 2023-06-23 NOTE — Telephone Encounter (Signed)
 Copied from CRM [id number]. Topic: Clinical - Medical Advice >> Jun 23, 2023 10:01 AM Pinkey ORN wrote: Reason for CRM: Requesting A Call Back From [facility] >> Jun 23, 2023 10:03 AM Pinkey ORN wrote: Patient states she's having some concerns about a cough, she's NOT experiencing any other symptoms nor is she coughing up anything. Patient call back number [phone]

## 2023-06-23 NOTE — Telephone Encounter (Signed)
 Thank you :)

## 2023-06-23 NOTE — Telephone Encounter (Signed)
 I called to follow up on the power wheelchair order. It is still in the processing stage, I was instructed to follow up in 72 hours. I will check back next week.   I also called [facility] to follow up on the hospital bed, they informed me they only do prosthetics. I have followed up with Ellouise in regards to this and let her know that I went ahead and faxed the order for the bed to [facility] as well. I will follow up on this order next week as well.

## 2023-06-25 DIAGNOSIS — G894 Chronic pain syndrome: Secondary | ICD-10-CM | POA: Diagnosis not present

## 2023-06-25 DIAGNOSIS — K219 Gastro-esophageal reflux disease without esophagitis: Secondary | ICD-10-CM | POA: Diagnosis not present

## 2023-06-25 DIAGNOSIS — I110 Hypertensive heart disease with heart failure: Secondary | ICD-10-CM | POA: Diagnosis not present

## 2023-06-25 DIAGNOSIS — M797 Fibromyalgia: Secondary | ICD-10-CM | POA: Diagnosis not present

## 2023-06-25 DIAGNOSIS — E785 Hyperlipidemia, unspecified: Secondary | ICD-10-CM | POA: Diagnosis not present

## 2023-06-25 DIAGNOSIS — G43909 Migraine, unspecified, not intractable, without status migrainosus: Secondary | ICD-10-CM | POA: Diagnosis not present

## 2023-06-25 DIAGNOSIS — M4802 Spinal stenosis, cervical region: Secondary | ICD-10-CM | POA: Diagnosis not present

## 2023-06-25 DIAGNOSIS — G629 Polyneuropathy, unspecified: Secondary | ICD-10-CM | POA: Diagnosis not present

## 2023-06-25 DIAGNOSIS — J45901 Unspecified asthma with (acute) exacerbation: Secondary | ICD-10-CM | POA: Diagnosis not present

## 2023-06-25 DIAGNOSIS — Z87440 Personal history of urinary (tract) infections: Secondary | ICD-10-CM | POA: Diagnosis not present

## 2023-06-25 DIAGNOSIS — K760 Fatty (change of) liver, not elsewhere classified: Secondary | ICD-10-CM | POA: Diagnosis not present

## 2023-06-25 DIAGNOSIS — Z79899 Other long term (current) drug therapy: Secondary | ICD-10-CM | POA: Diagnosis not present

## 2023-06-25 DIAGNOSIS — L400 Psoriasis vulgaris: Secondary | ICD-10-CM | POA: Diagnosis not present

## 2023-06-25 DIAGNOSIS — I872 Venous insufficiency (chronic) (peripheral): Secondary | ICD-10-CM | POA: Diagnosis not present

## 2023-06-25 DIAGNOSIS — E559 Vitamin D deficiency, unspecified: Secondary | ICD-10-CM | POA: Diagnosis not present

## 2023-06-25 DIAGNOSIS — Z7951 Long term (current) use of inhaled steroids: Secondary | ICD-10-CM | POA: Diagnosis not present

## 2023-06-25 DIAGNOSIS — I5033 Acute on chronic diastolic (congestive) heart failure: Secondary | ICD-10-CM | POA: Diagnosis not present

## 2023-06-25 DIAGNOSIS — M300 Polyarteritis nodosa: Secondary | ICD-10-CM | POA: Diagnosis not present

## 2023-06-25 DIAGNOSIS — G4733 Obstructive sleep apnea (adult) (pediatric): Secondary | ICD-10-CM | POA: Diagnosis not present

## 2023-06-25 DIAGNOSIS — E538 Deficiency of other specified B group vitamins: Secondary | ICD-10-CM | POA: Diagnosis not present

## 2023-06-25 DIAGNOSIS — G9529 Other cord compression: Secondary | ICD-10-CM | POA: Diagnosis not present

## 2023-06-26 ENCOUNTER — Encounter: Payer: Self-pay | Admitting: Family Medicine

## 2023-06-26 ENCOUNTER — Telehealth: Payer: Self-pay

## 2023-06-26 ENCOUNTER — Telehealth: Payer: Self-pay | Admitting: Family Medicine

## 2023-06-26 ENCOUNTER — Telehealth (INDEPENDENT_AMBULATORY_CARE_PROVIDER_SITE_OTHER): Payer: 59 | Admitting: Family Medicine

## 2023-06-26 DIAGNOSIS — E785 Hyperlipidemia, unspecified: Secondary | ICD-10-CM | POA: Diagnosis not present

## 2023-06-26 DIAGNOSIS — M300 Polyarteritis nodosa: Secondary | ICD-10-CM | POA: Diagnosis not present

## 2023-06-26 DIAGNOSIS — E559 Vitamin D deficiency, unspecified: Secondary | ICD-10-CM | POA: Diagnosis not present

## 2023-06-26 DIAGNOSIS — M797 Fibromyalgia: Secondary | ICD-10-CM | POA: Diagnosis not present

## 2023-06-26 DIAGNOSIS — I872 Venous insufficiency (chronic) (peripheral): Secondary | ICD-10-CM | POA: Diagnosis not present

## 2023-06-26 DIAGNOSIS — G43909 Migraine, unspecified, not intractable, without status migrainosus: Secondary | ICD-10-CM | POA: Diagnosis not present

## 2023-06-26 DIAGNOSIS — K219 Gastro-esophageal reflux disease without esophagitis: Secondary | ICD-10-CM | POA: Diagnosis not present

## 2023-06-26 DIAGNOSIS — R051 Acute cough: Secondary | ICD-10-CM | POA: Diagnosis not present

## 2023-06-26 DIAGNOSIS — Z87440 Personal history of urinary (tract) infections: Secondary | ICD-10-CM | POA: Diagnosis not present

## 2023-06-26 DIAGNOSIS — Z7951 Long term (current) use of inhaled steroids: Secondary | ICD-10-CM | POA: Diagnosis not present

## 2023-06-26 DIAGNOSIS — I5033 Acute on chronic diastolic (congestive) heart failure: Secondary | ICD-10-CM | POA: Diagnosis not present

## 2023-06-26 DIAGNOSIS — G629 Polyneuropathy, unspecified: Secondary | ICD-10-CM | POA: Diagnosis not present

## 2023-06-26 DIAGNOSIS — L400 Psoriasis vulgaris: Secondary | ICD-10-CM | POA: Diagnosis not present

## 2023-06-26 DIAGNOSIS — M4802 Spinal stenosis, cervical region: Secondary | ICD-10-CM | POA: Diagnosis not present

## 2023-06-26 DIAGNOSIS — E538 Deficiency of other specified B group vitamins: Secondary | ICD-10-CM | POA: Diagnosis not present

## 2023-06-26 DIAGNOSIS — G4733 Obstructive sleep apnea (adult) (pediatric): Secondary | ICD-10-CM | POA: Diagnosis not present

## 2023-06-26 DIAGNOSIS — J45901 Unspecified asthma with (acute) exacerbation: Secondary | ICD-10-CM | POA: Diagnosis not present

## 2023-06-26 DIAGNOSIS — I110 Hypertensive heart disease with heart failure: Secondary | ICD-10-CM | POA: Diagnosis not present

## 2023-06-26 DIAGNOSIS — Z79899 Other long term (current) drug therapy: Secondary | ICD-10-CM | POA: Diagnosis not present

## 2023-06-26 DIAGNOSIS — G9529 Other cord compression: Secondary | ICD-10-CM | POA: Diagnosis not present

## 2023-06-26 DIAGNOSIS — G894 Chronic pain syndrome: Secondary | ICD-10-CM | POA: Diagnosis not present

## 2023-06-26 DIAGNOSIS — K760 Fatty (change of) liver, not elsewhere classified: Secondary | ICD-10-CM | POA: Diagnosis not present

## 2023-06-26 MED ORDER — PROMETHAZINE-DM 6.25-15 MG/5ML PO SYRP: 5.0000 mL | ORAL_SOLUTION | Freq: Four times a day (QID) | ORAL | 0 refills | Status: DC | PRN

## 2023-06-26 NOTE — Telephone Encounter (Signed)
 This has been addressed.

## 2023-06-26 NOTE — Telephone Encounter (Signed)
 Copied from CRM [id number]. Topic: General - Other >> Jun 26, 2023 11:42 AM [place] J wrote: Reason for CRM: Patient has a question for CMA Tanya. Patient saw Dr. Paulla Bossier this morning.

## 2023-06-26 NOTE — Telephone Encounter (Signed)
 Copied from CRM [id number]. Topic: General - Other >> Jun 26, 2023 12:19 PM Kita Perish H wrote: Reason for CRM: Patient had an appointment this morning with Dr. Paulla Bossier and states she has a question for [facility], please reach out to patient, thanks.  [place] [phone]

## 2023-06-26 NOTE — Progress Notes (Signed)
 Virtual Visit via Video   I connected with patient on 06/26/23 at 10:20 AM EST by a video enabled telemedicine application and verified that I am speaking with the correct person using two identifiers.  Location patient: Home Location provider: [place], Office Persons participating in the virtual visit: Patient, Provider, CMA Lynnie Saucier H)  I discussed the limitations of evaluation and management by telemedicine and the availability of in person appointments. The patient expressed understanding and agreed to proceed.  Subjective:   HPI:   Cough- sxs started Friday w/ sinus congestion.  No fever, body aches, chills.  Now feels she has congestion in her chest.  Using Mucinex  w/o relief.  + cough- intermittently productive of yellow sputum.  Denies SOB.  No wheezing.    ROS:   See pertinent positives and negatives per HPI.  Patient Active Problem List   Diagnosis Date Noted   Asthma, chronic, unspecified asthma severity, with acute exacerbation 01/12/2023   Hypomagnesemia 01/12/2023   Bedbound 12/14/2022   Acute on chronic heart failure with preserved ejection fraction (HFpEF) (HCC) 11/23/2022   Pressure injury of skin 11/23/2022   Nocturnal hypoxemia 06/21/2022   Elevated LFTs 05/06/2022   Obesity hypoventilation syndrome (HCC) 05/06/2022   Acute respiratory failure with hypoxia (HCC) 04/22/2022   Hypokalemia 04/22/2022   Leg cramps 01/12/2022   Fever of unknown origin 08/12/2021   Sacral pressure sore 07/06/2021   Vitamin B6 deficiency 06/08/2021   Myelomalacia of cervical cord (HCC) 06/06/2021   Compressive cervical cord myelomalacia (HCC) 06/05/2021   B12 deficiency 06/05/2021   Folate deficiency 06/05/2021   Vitamin D  deficiency 06/05/2021   Leg weakness, bilateral 06/04/2021   Cervical atypia 01/06/2021   Steatosis of liver 01/06/2021   Hyponatremia 07/27/2019   Urinary urgency 04/04/2018   Chronic narcotic use 09/07/2016   Acute blood loss anemia  01/14/2016   Multiple gastric ulcers    PAN (polyarteritis nodosa) (HCC) 11/24/2015   GERD (gastroesophageal reflux disease) 01/08/2014   Routine general medical examination at a health care facility 04/23/2013   Bariatric surgery status 10/31/2012   S/P skin biopsy 10/31/2012   Psoriasis 01/09/2012   DDD (degenerative disc disease), lumbar 11/30/2011   Migraine headache    [name] (obstructive sleep apnea) 11/16/2010   HYPERGLYCEMIA, FASTING 10/08/2009   CONTRACTURE OF TENDON 08/17/2009   PARESTHESIA, HANDS 12/23/2008   Leg pain, right 11/05/2008   ADVERSE DRUG REACTION 04/03/2008   PERIPHERAL EDEMA 03/26/2008   Obesity, morbid, BMI 50 or higher (HCC) 01/03/2007   Cellulitis 01/03/2007   Hyperlipidemia 09/22/2006   Fibromyalgia 09/20/2006   Anxiety and depression 06/28/2006   Essential hypertension 06/28/2006    Social History   Tobacco Use   Smoking status: Never   Smokeless tobacco: Never  Substance Use Topics   Alcohol use: No    Current Outpatient Medications:    ALPRAZolam  (XANAX ) 1 MG tablet, Take 1 tablet (1 mg total) by mouth 2 (two) times daily., Disp: 60 tablet, Rfl: 3   ARIPiprazole  (ABILIFY ) 15 MG tablet, TAKE 1 TABLET BY MOUTH EVERY DAY, Disp: 90 tablet, Rfl: 1   Armodafinil  250 MG tablet, Take 1 tablet (250 mg total) by mouth in the morning and at bedtime., Disp: 60 tablet, Rfl: 3   Biotin  10 MG TABS, Take 1 tablet (10 mg total) by mouth in the morning., Disp: 90 tablet, Rfl: 1   buPROPion  (WELLBUTRIN  XL) 300 MG 24 hr tablet, TAKE 1 TABLET BY MOUTH EVERY DAY, Disp: 30 tablet, Rfl: 2  calcium  citrate (CALCITRATE - DOSED IN MG ELEMENTAL CALCIUM ) 950 (200 Ca) MG tablet, Take 1 tablet (200 mg of elemental calcium  total) by mouth daily., Disp: 90 tablet, Rfl: 1   clonazePAM  (KLONOPIN ) 1 MG tablet, Take 1 tablet (1 mg total) by mouth at bedtime., Disp: 30 tablet, Rfl: 3   cyanocobalamin  (VITAMIN B12) 1000 MCG tablet, TAKE 1 TABLET BY MOUTH ONCE DAILY, Disp: 30 tablet,  Rfl: 2   cyclobenzaprine  (FLEXERIL ) 10 MG tablet, TAKE 2 TABLETS BY MOUTH 2 TIMES DAILY, Disp: 120 tablet, Rfl: 2   diclofenac  Sodium (VOLTAREN ) 1 % GEL, Apply 4 g topically 4 (four) times daily., Disp: 1 g, Rfl: 1   DULoxetine  (CYMBALTA ) 60 MG capsule, Take 2 capsules (120 mg total) by mouth daily. (Patient taking differently: Take 60 mg by mouth 2 (two) times daily. Pt reported taking two capsules(120mg ) once daily), Disp: 240 capsule, Rfl: 2   FEROSUL 325 (65 Fe) MG tablet, TAKE 1 TABLET BY MOUTH EVERY MORNING WITH BREAKFAST, Disp: 30 tablet, Rfl: 2   fluticasone -salmeterol (ADVAIR) 250-50 MCG/ACT AEPB, Inhale 1 puff into the lungs every 12 (twelve) hours., Disp: 60 each, Rfl: 11   folic acid  (FOLVITE ) 1 MG tablet, TAKE 1 TABLET BY MOUTH ONCE DAILY, Disp: 30 tablet, Rfl: 2   furosemide  (LASIX ) 40 MG tablet, Take 1 tablet by mouth daily. May take an additional tablet by mouth IF weight gain of 5 lbs and CALL MD for further instructions., Disp: 90 tablet, Rfl: 1   gabapentin  (NEURONTIN ) 600 MG tablet, Take 1 tablet (600 mg total) by mouth 2 (two) times daily as needed. Pt takes daily - can take up to twice daily, Disp: 60 tablet, Rfl: 1   GNP VITAMIN B-1 100 MG tablet, TAKE 1 TABLET BY MOUTH EVERY DAY, Disp: 30 tablet, Rfl: 2   HYDROcodone -acetaminophen  (NORCO) 10-325 MG tablet, TAKE 1 TABLET BY MOUTH EVERY 8 HOURS AS NEEDED, Disp: 90 tablet, Rfl: 0   ipratropium-albuterol  (DUONEB) 0.5-2.5 (3) MG/3ML SOLN, Take 3 mLs by nebulization every 6 (six) hours as needed (wheezing/shortness of breath)., Disp: 360 mL, Rfl: 0   nebivolol  (BYSTOLIC ) 10 MG tablet, TAKE 1 TABLET BY MOUTH EVERY DAY, Disp: 30 tablet, Rfl: 2   nystatin  (MYCOSTATIN /NYSTOP ) powder, APPLY 1 APPLICATION TOPICALLY 3 TIMES DAILY (Patient taking differently: Apply 1 Application topically 3 (three) times daily as needed (yeast infection).), Disp: 60 g, Rfl: 3   Oxcarbazepine  (TRILEPTAL ) 300 MG tablet, Take 300 mg by mouth 2 (two) times  daily., Disp: , Rfl:    pantoprazole  (PROTONIX ) 40 MG tablet, TAKE 1 TABLET BY MOUTH 2 TIMES DAILY, Disp: 60 tablet, Rfl: 2   phentermine  37.5 MG capsule, Take 1 capsule (37.5 mg total) by mouth every morning., Disp: 90 capsule, Rfl: 0   promethazine  (PHENERGAN ) 25 MG tablet, TAKE 1 TABLET BY MOUTH EVERY 6 HOURS AS NEEDED FOR NAUSEA AND VOMITING, Disp: 45 tablet, Rfl: 3   pyridOXINE  (VITAMIN B6) 100 MG tablet, TAKE 1 TABLET BY MOUTH ONCE DAILY, Disp: 30 tablet, Rfl: 2   potassium chloride  (KLOR-CON ) 20 MEQ packet, Take 1 packet by mouth daily. Take with lasix ., Disp: 30 packet, Rfl: 1  Allergies  Allergen Reactions   Erythromycin Anaphylaxis   Niacin Anaphylaxis, Shortness Of Breath and Swelling    Swelling and "problems breathing"   Sulfamethoxazole-Trimethoprim Anaphylaxis, Swelling, Rash and Other (See Comments)    Throat closed and the eyes became swollen   Aspirin Hives   Bactrim Hives   Benzoin Compound Rash  Cephalexin Hives    Tolerated ceftriaxone  and cefepime  07/2019   Iohexol  Hives    Pt treated with PO benedryl   Lisinopril Hives   Naproxen Hives   Pnu-Imune [Pneumococcal Polysaccharide Vaccine] Rash   Sulfonamide Derivatives Rash    Objective:   There were no vitals taken for this visit. AAOx3, NAD NCAT, EOMI No obvious CN deficits Coloring WNL Pt is able to speak clearly, coherently without shortness of breath or increased work of breathing.  Thought process is linear.  Mood is appropriate.   Assessment and Plan:   Cough- new.  Pt did not cough during our video visit.  No increased WOB, no wheezing, no SOB.  No sxs concerning for bacterial infxn at this time.  Start cough syrup prn.  Reviewed supportive care and red flags that should prompt return.  Pt expressed understanding and is in agreement w/ plan.    Laymon Priest, MD 06/26/2023

## 2023-06-27 ENCOUNTER — Other Ambulatory Visit: Payer: Self-pay | Admitting: Family Medicine

## 2023-06-27 NOTE — Telephone Encounter (Signed)
Requested Prescriptions   Signed Prescriptions Disp Refills   ARIPiprazole (ABILIFY) 15 MG tablet 90 tablet 1    Sig: TAKE 1 TABLET BY MOUTH EVERY DAY    Authorizing Provider: Sheliah Hatch    Ordering User: Eldred Manges     Date of patient request: 06/26/18 Last office visit: Visit date not found Upcoming visit: Visit date not found Date of last refill: 01/13/2023 Last refill amount: 90

## 2023-06-27 NOTE — Telephone Encounter (Signed)
Prescription refilled earlier today.

## 2023-06-27 NOTE — Telephone Encounter (Signed)
Followed up on power wheelchair and hospital bed order again today. Was told the same thing about the wheelchair being processed but was given a direct fax for the power mobility dept to fax the order directly to and was instructed to tell the patient she can call directly and begin processing the order with them over the phone while they wait to receive the order. I faxed the order to [phone] and informed patient to call [phone] and press option 1.   I was told that [facility] does not supply hospital beds and was directed to [facility]. I gave them a call and was directed to [place] and was provided their fax number. I faxed the order to [phone]. I was told the [place] office did not have a direct phone number to call.   I have updated patient on all of this and will follow up again on the orders soon.

## 2023-06-28 ENCOUNTER — Other Ambulatory Visit: Payer: Self-pay | Admitting: Family Medicine

## 2023-06-28 DIAGNOSIS — G9589 Other specified diseases of spinal cord: Secondary | ICD-10-CM

## 2023-06-28 MED ORDER — PHENTERMINE HCL 37.5 MG PO CAPS: 37.5000 mg | ORAL_CAPSULE | ORAL | 0 refills | Status: DC

## 2023-06-29 ENCOUNTER — Telehealth: Payer: Self-pay | Admitting: Family Medicine

## 2023-06-29 ENCOUNTER — Other Ambulatory Visit: Payer: Self-pay

## 2023-06-29 DIAGNOSIS — G4733 Obstructive sleep apnea (adult) (pediatric): Secondary | ICD-10-CM | POA: Diagnosis not present

## 2023-06-29 DIAGNOSIS — G894 Chronic pain syndrome: Secondary | ICD-10-CM | POA: Diagnosis not present

## 2023-06-29 DIAGNOSIS — G9529 Other cord compression: Secondary | ICD-10-CM | POA: Diagnosis not present

## 2023-06-29 DIAGNOSIS — M300 Polyarteritis nodosa: Secondary | ICD-10-CM | POA: Diagnosis not present

## 2023-06-29 DIAGNOSIS — J45901 Unspecified asthma with (acute) exacerbation: Secondary | ICD-10-CM | POA: Diagnosis not present

## 2023-06-29 DIAGNOSIS — L400 Psoriasis vulgaris: Secondary | ICD-10-CM | POA: Diagnosis not present

## 2023-06-29 DIAGNOSIS — G43909 Migraine, unspecified, not intractable, without status migrainosus: Secondary | ICD-10-CM | POA: Diagnosis not present

## 2023-06-29 DIAGNOSIS — E538 Deficiency of other specified B group vitamins: Secondary | ICD-10-CM | POA: Diagnosis not present

## 2023-06-29 DIAGNOSIS — Z87440 Personal history of urinary (tract) infections: Secondary | ICD-10-CM | POA: Diagnosis not present

## 2023-06-29 DIAGNOSIS — M797 Fibromyalgia: Secondary | ICD-10-CM | POA: Diagnosis not present

## 2023-06-29 DIAGNOSIS — E785 Hyperlipidemia, unspecified: Secondary | ICD-10-CM | POA: Diagnosis not present

## 2023-06-29 DIAGNOSIS — K219 Gastro-esophageal reflux disease without esophagitis: Secondary | ICD-10-CM | POA: Diagnosis not present

## 2023-06-29 DIAGNOSIS — G629 Polyneuropathy, unspecified: Secondary | ICD-10-CM | POA: Diagnosis not present

## 2023-06-29 DIAGNOSIS — I5033 Acute on chronic diastolic (congestive) heart failure: Secondary | ICD-10-CM | POA: Diagnosis not present

## 2023-06-29 DIAGNOSIS — I872 Venous insufficiency (chronic) (peripheral): Secondary | ICD-10-CM | POA: Diagnosis not present

## 2023-06-29 DIAGNOSIS — K760 Fatty (change of) liver, not elsewhere classified: Secondary | ICD-10-CM | POA: Diagnosis not present

## 2023-06-29 DIAGNOSIS — Z7951 Long term (current) use of inhaled steroids: Secondary | ICD-10-CM | POA: Diagnosis not present

## 2023-06-29 DIAGNOSIS — Z79899 Other long term (current) drug therapy: Secondary | ICD-10-CM | POA: Diagnosis not present

## 2023-06-29 DIAGNOSIS — I110 Hypertensive heart disease with heart failure: Secondary | ICD-10-CM | POA: Diagnosis not present

## 2023-06-29 DIAGNOSIS — M4802 Spinal stenosis, cervical region: Secondary | ICD-10-CM | POA: Diagnosis not present

## 2023-06-29 DIAGNOSIS — E559 Vitamin D deficiency, unspecified: Secondary | ICD-10-CM | POA: Diagnosis not present

## 2023-06-29 NOTE — Telephone Encounter (Signed)
I attempted to do the PA for a new hospital bed and was unable to complete due to not having a price for the bed and gel top overlay. I contacted [facility] to let them know this info and provided them the reference number for them to call insurance company and complete the PA. Reference # is [id number]. CPT code for order is [phone] and [phone]. I will update [name] by my chart to let her know and I will follow up on the PA in a couple of days.

## 2023-06-29 NOTE — Telephone Encounter (Unsigned)
Copied from CRM [id number]. Topic: General - Call Back - No Documentation >> Jun 29, 2023 11:05 AM Adele Barthel wrote: Reason for CRM: Patient was returning call from [facility] concerning her wheelchair and hospital bed orders. Patient advised she needed to answer some of Kelly's questions.   CB# [phone]

## 2023-06-29 NOTE — Telephone Encounter (Signed)
Noted! Thank you

## 2023-06-29 NOTE — Telephone Encounter (Signed)
I have talked with [name] and apologized that I was just now seeing this message since I am not in clinic daily anymore but I wanted to call and be sure she got the cough addressed and I did follow up with her about the DME orders. Confirmed with her that she received the message about calling the dept. For her wheelchair. She stated she would be calling them as instructed. I will follow up on the order for her hospital bed that was sent to [facility].

## 2023-06-30 ENCOUNTER — Telehealth (HOSPITAL_BASED_OUTPATIENT_CLINIC_OR_DEPARTMENT_OTHER): Payer: 59 | Admitting: Pulmonary Disease

## 2023-06-30 ENCOUNTER — Encounter: Payer: Self-pay | Admitting: Family Medicine

## 2023-06-30 NOTE — Telephone Encounter (Signed)
Patient is asking if she can add a med to help break up the mucous and such in her chest  Please advise

## 2023-06-30 NOTE — Telephone Encounter (Signed)
Called [name] back and discussed, apparently when she had to receive emergency assistance when she had a syncopal episode Emergency services moved the bed and the lift chair and chair lock are both now stuck in position and cannot be used, Patient cannot get into the bed at the height it is at due to back injuries.  Patient states she spoke to someone at [employer] who worked with her and stated that her insurance would cover it and that we needed to just fax the order to the provided fax number, Elira stated she would look for this information again and call back with it so she can assist in expediting this

## 2023-07-03 ENCOUNTER — Ambulatory Visit: Payer: 59 | Admitting: Clinical

## 2023-07-03 DIAGNOSIS — M4802 Spinal stenosis, cervical region: Secondary | ICD-10-CM | POA: Diagnosis not present

## 2023-07-03 DIAGNOSIS — Z87440 Personal history of urinary (tract) infections: Secondary | ICD-10-CM | POA: Diagnosis not present

## 2023-07-03 DIAGNOSIS — F419 Anxiety disorder, unspecified: Secondary | ICD-10-CM | POA: Diagnosis not present

## 2023-07-03 DIAGNOSIS — E538 Deficiency of other specified B group vitamins: Secondary | ICD-10-CM | POA: Diagnosis not present

## 2023-07-03 DIAGNOSIS — F330 Major depressive disorder, recurrent, mild: Secondary | ICD-10-CM | POA: Diagnosis not present

## 2023-07-03 DIAGNOSIS — G894 Chronic pain syndrome: Secondary | ICD-10-CM | POA: Diagnosis not present

## 2023-07-03 DIAGNOSIS — L400 Psoriasis vulgaris: Secondary | ICD-10-CM | POA: Diagnosis not present

## 2023-07-03 DIAGNOSIS — J45901 Unspecified asthma with (acute) exacerbation: Secondary | ICD-10-CM | POA: Diagnosis not present

## 2023-07-03 DIAGNOSIS — K219 Gastro-esophageal reflux disease without esophagitis: Secondary | ICD-10-CM | POA: Diagnosis not present

## 2023-07-03 DIAGNOSIS — G9529 Other cord compression: Secondary | ICD-10-CM | POA: Diagnosis not present

## 2023-07-03 DIAGNOSIS — I872 Venous insufficiency (chronic) (peripheral): Secondary | ICD-10-CM | POA: Diagnosis not present

## 2023-07-03 DIAGNOSIS — M797 Fibromyalgia: Secondary | ICD-10-CM | POA: Diagnosis not present

## 2023-07-03 DIAGNOSIS — I110 Hypertensive heart disease with heart failure: Secondary | ICD-10-CM | POA: Diagnosis not present

## 2023-07-03 DIAGNOSIS — G43909 Migraine, unspecified, not intractable, without status migrainosus: Secondary | ICD-10-CM | POA: Diagnosis not present

## 2023-07-03 DIAGNOSIS — Z7951 Long term (current) use of inhaled steroids: Secondary | ICD-10-CM | POA: Diagnosis not present

## 2023-07-03 DIAGNOSIS — G629 Polyneuropathy, unspecified: Secondary | ICD-10-CM | POA: Diagnosis not present

## 2023-07-03 DIAGNOSIS — E559 Vitamin D deficiency, unspecified: Secondary | ICD-10-CM | POA: Diagnosis not present

## 2023-07-03 DIAGNOSIS — E785 Hyperlipidemia, unspecified: Secondary | ICD-10-CM | POA: Diagnosis not present

## 2023-07-03 DIAGNOSIS — G4733 Obstructive sleep apnea (adult) (pediatric): Secondary | ICD-10-CM | POA: Diagnosis not present

## 2023-07-03 DIAGNOSIS — Z79899 Other long term (current) drug therapy: Secondary | ICD-10-CM | POA: Diagnosis not present

## 2023-07-03 DIAGNOSIS — I5033 Acute on chronic diastolic (congestive) heart failure: Secondary | ICD-10-CM | POA: Diagnosis not present

## 2023-07-03 DIAGNOSIS — M300 Polyarteritis nodosa: Secondary | ICD-10-CM | POA: Diagnosis not present

## 2023-07-03 DIAGNOSIS — K760 Fatty (change of) liver, not elsewhere classified: Secondary | ICD-10-CM | POA: Diagnosis not present
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
## 2023-07-04 ENCOUNTER — Other Ambulatory Visit: Payer: Self-pay | Admitting: Family Medicine

## 2023-07-04 DIAGNOSIS — Z87440 Personal history of urinary (tract) infections: Secondary | ICD-10-CM | POA: Diagnosis not present

## 2023-07-04 DIAGNOSIS — Z79899 Other long term (current) drug therapy: Secondary | ICD-10-CM | POA: Diagnosis not present

## 2023-07-04 DIAGNOSIS — G4733 Obstructive sleep apnea (adult) (pediatric): Secondary | ICD-10-CM | POA: Diagnosis not present

## 2023-07-04 DIAGNOSIS — M797 Fibromyalgia: Secondary | ICD-10-CM | POA: Diagnosis not present

## 2023-07-04 DIAGNOSIS — G9529 Other cord compression: Secondary | ICD-10-CM | POA: Diagnosis not present

## 2023-07-04 DIAGNOSIS — E538 Deficiency of other specified B group vitamins: Secondary | ICD-10-CM | POA: Diagnosis not present

## 2023-07-04 DIAGNOSIS — I872 Venous insufficiency (chronic) (peripheral): Secondary | ICD-10-CM | POA: Diagnosis not present

## 2023-07-04 DIAGNOSIS — I5033 Acute on chronic diastolic (congestive) heart failure: Secondary | ICD-10-CM | POA: Diagnosis not present

## 2023-07-04 DIAGNOSIS — K760 Fatty (change of) liver, not elsewhere classified: Secondary | ICD-10-CM | POA: Diagnosis not present

## 2023-07-04 DIAGNOSIS — J45901 Unspecified asthma with (acute) exacerbation: Secondary | ICD-10-CM | POA: Diagnosis not present

## 2023-07-04 DIAGNOSIS — M300 Polyarteritis nodosa: Secondary | ICD-10-CM | POA: Diagnosis not present

## 2023-07-04 DIAGNOSIS — E559 Vitamin D deficiency, unspecified: Secondary | ICD-10-CM | POA: Diagnosis not present

## 2023-07-04 DIAGNOSIS — L400 Psoriasis vulgaris: Secondary | ICD-10-CM | POA: Diagnosis not present

## 2023-07-04 DIAGNOSIS — M4802 Spinal stenosis, cervical region: Secondary | ICD-10-CM | POA: Diagnosis not present

## 2023-07-04 DIAGNOSIS — K219 Gastro-esophageal reflux disease without esophagitis: Secondary | ICD-10-CM | POA: Diagnosis not present

## 2023-07-04 DIAGNOSIS — Z7951 Long term (current) use of inhaled steroids: Secondary | ICD-10-CM | POA: Diagnosis not present

## 2023-07-04 DIAGNOSIS — G894 Chronic pain syndrome: Secondary | ICD-10-CM | POA: Diagnosis not present

## 2023-07-04 DIAGNOSIS — G629 Polyneuropathy, unspecified: Secondary | ICD-10-CM | POA: Diagnosis not present

## 2023-07-04 DIAGNOSIS — G43909 Migraine, unspecified, not intractable, without status migrainosus: Secondary | ICD-10-CM | POA: Diagnosis not present

## 2023-07-04 DIAGNOSIS — E785 Hyperlipidemia, unspecified: Secondary | ICD-10-CM | POA: Diagnosis not present

## 2023-07-04 DIAGNOSIS — I110 Hypertensive heart disease with heart failure: Secondary | ICD-10-CM | POA: Diagnosis not present

## 2023-07-05 NOTE — Telephone Encounter (Signed)
Sent as FYI.  Do we have any further updates Tresa Endo?

## 2023-07-05 NOTE — Telephone Encounter (Signed)
Requested Prescriptions   Pending Prescriptions Disp Refills   ALPRAZolam (XANAX) 1 MG tablet [Pharmacy Med Name: alprazolam 1 mg tablet] 60 tablet 3    Sig: TAKE 1 TABLET BY MOUTH 2 TIMES DAILY     Date of patient request: 07/04/2023 Last office visit: 06/26/2023 Upcoming visit: Visit date not found Date of last refill: 06/16/2023 Last refill amount: 60

## 2023-07-06 ENCOUNTER — Telehealth: Payer: Self-pay | Admitting: Family Medicine

## 2023-07-06 DIAGNOSIS — L400 Psoriasis vulgaris: Secondary | ICD-10-CM | POA: Diagnosis not present

## 2023-07-06 DIAGNOSIS — K760 Fatty (change of) liver, not elsewhere classified: Secondary | ICD-10-CM | POA: Diagnosis not present

## 2023-07-06 DIAGNOSIS — I110 Hypertensive heart disease with heart failure: Secondary | ICD-10-CM | POA: Diagnosis not present

## 2023-07-06 DIAGNOSIS — M797 Fibromyalgia: Secondary | ICD-10-CM | POA: Diagnosis not present

## 2023-07-06 DIAGNOSIS — I872 Venous insufficiency (chronic) (peripheral): Secondary | ICD-10-CM | POA: Diagnosis not present

## 2023-07-06 DIAGNOSIS — M300 Polyarteritis nodosa: Secondary | ICD-10-CM | POA: Diagnosis not present

## 2023-07-06 DIAGNOSIS — J45901 Unspecified asthma with (acute) exacerbation: Secondary | ICD-10-CM | POA: Diagnosis not present

## 2023-07-06 DIAGNOSIS — I5033 Acute on chronic diastolic (congestive) heart failure: Secondary | ICD-10-CM | POA: Diagnosis not present

## 2023-07-06 DIAGNOSIS — G894 Chronic pain syndrome: Secondary | ICD-10-CM | POA: Diagnosis not present

## 2023-07-06 DIAGNOSIS — G9529 Other cord compression: Secondary | ICD-10-CM | POA: Diagnosis not present

## 2023-07-06 DIAGNOSIS — Z87440 Personal history of urinary (tract) infections: Secondary | ICD-10-CM | POA: Diagnosis not present

## 2023-07-06 DIAGNOSIS — M4802 Spinal stenosis, cervical region: Secondary | ICD-10-CM | POA: Diagnosis not present

## 2023-07-06 DIAGNOSIS — G4733 Obstructive sleep apnea (adult) (pediatric): Secondary | ICD-10-CM | POA: Diagnosis not present

## 2023-07-06 DIAGNOSIS — K219 Gastro-esophageal reflux disease without esophagitis: Secondary | ICD-10-CM | POA: Diagnosis not present

## 2023-07-06 DIAGNOSIS — G629 Polyneuropathy, unspecified: Secondary | ICD-10-CM | POA: Diagnosis not present

## 2023-07-06 DIAGNOSIS — G43909 Migraine, unspecified, not intractable, without status migrainosus: Secondary | ICD-10-CM | POA: Diagnosis not present

## 2023-07-06 DIAGNOSIS — E559 Vitamin D deficiency, unspecified: Secondary | ICD-10-CM | POA: Diagnosis not present

## 2023-07-06 DIAGNOSIS — E538 Deficiency of other specified B group vitamins: Secondary | ICD-10-CM | POA: Diagnosis not present

## 2023-07-06 DIAGNOSIS — Z79899 Other long term (current) drug therapy: Secondary | ICD-10-CM | POA: Diagnosis not present

## 2023-07-06 DIAGNOSIS — E785 Hyperlipidemia, unspecified: Secondary | ICD-10-CM | POA: Diagnosis not present

## 2023-07-06 DIAGNOSIS — Z7951 Long term (current) use of inhaled steroids: Secondary | ICD-10-CM | POA: Diagnosis not present

## 2023-07-06 NOTE — Telephone Encounter (Signed)
Copied from CRM [id number]. Topic: General - Other >> Jul 05, 2023 11:14 AM Dennison Nancy wrote: Reason for CRM: Patient request to speak with Tresa Endo the office manager ,reason regarding medical equipment  Please contact patient at  [phone]

## 2023-07-06 NOTE — Telephone Encounter (Signed)
We were waiting on the Fax number from her not sure what happened, can try resending again

## 2023-07-07 NOTE — Telephone Encounter (Signed)
Called and LM to call back will try again later

## 2023-07-10 ENCOUNTER — Ambulatory Visit (INDEPENDENT_AMBULATORY_CARE_PROVIDER_SITE_OTHER): Payer: 59 | Admitting: Clinical

## 2023-07-10 DIAGNOSIS — Z7951 Long term (current) use of inhaled steroids: Secondary | ICD-10-CM | POA: Diagnosis not present

## 2023-07-10 DIAGNOSIS — I872 Venous insufficiency (chronic) (peripheral): Secondary | ICD-10-CM | POA: Diagnosis not present

## 2023-07-10 DIAGNOSIS — E559 Vitamin D deficiency, unspecified: Secondary | ICD-10-CM | POA: Diagnosis not present

## 2023-07-10 DIAGNOSIS — F330 Major depressive disorder, recurrent, mild: Secondary | ICD-10-CM | POA: Diagnosis not present

## 2023-07-10 DIAGNOSIS — M797 Fibromyalgia: Secondary | ICD-10-CM | POA: Diagnosis not present

## 2023-07-10 DIAGNOSIS — G4733 Obstructive sleep apnea (adult) (pediatric): Secondary | ICD-10-CM | POA: Diagnosis not present

## 2023-07-10 DIAGNOSIS — G629 Polyneuropathy, unspecified: Secondary | ICD-10-CM | POA: Diagnosis not present

## 2023-07-10 DIAGNOSIS — Z87440 Personal history of urinary (tract) infections: Secondary | ICD-10-CM | POA: Diagnosis not present

## 2023-07-10 DIAGNOSIS — E785 Hyperlipidemia, unspecified: Secondary | ICD-10-CM | POA: Diagnosis not present

## 2023-07-10 DIAGNOSIS — I5033 Acute on chronic diastolic (congestive) heart failure: Secondary | ICD-10-CM | POA: Diagnosis not present

## 2023-07-10 DIAGNOSIS — M300 Polyarteritis nodosa: Secondary | ICD-10-CM | POA: Diagnosis not present

## 2023-07-10 DIAGNOSIS — J45901 Unspecified asthma with (acute) exacerbation: Secondary | ICD-10-CM | POA: Diagnosis not present

## 2023-07-10 DIAGNOSIS — G9529 Other cord compression: Secondary | ICD-10-CM | POA: Diagnosis not present

## 2023-07-10 DIAGNOSIS — M4802 Spinal stenosis, cervical region: Secondary | ICD-10-CM | POA: Diagnosis not present

## 2023-07-10 DIAGNOSIS — Z79899 Other long term (current) drug therapy: Secondary | ICD-10-CM | POA: Diagnosis not present

## 2023-07-10 DIAGNOSIS — I110 Hypertensive heart disease with heart failure: Secondary | ICD-10-CM | POA: Diagnosis not present

## 2023-07-10 DIAGNOSIS — K219 Gastro-esophageal reflux disease without esophagitis: Secondary | ICD-10-CM | POA: Diagnosis not present

## 2023-07-10 DIAGNOSIS — F419 Anxiety disorder, unspecified: Secondary | ICD-10-CM

## 2023-07-10 DIAGNOSIS — E538 Deficiency of other specified B group vitamins: Secondary | ICD-10-CM | POA: Diagnosis not present

## 2023-07-10 DIAGNOSIS — G894 Chronic pain syndrome: Secondary | ICD-10-CM | POA: Diagnosis not present

## 2023-07-10 DIAGNOSIS — G43909 Migraine, unspecified, not intractable, without status migrainosus: Secondary | ICD-10-CM | POA: Diagnosis not present

## 2023-07-10 DIAGNOSIS — L400 Psoriasis vulgaris: Secondary | ICD-10-CM | POA: Diagnosis not present

## 2023-07-10 DIAGNOSIS — K760 Fatty (change of) liver, not elsewhere classified: Secondary | ICD-10-CM | POA: Diagnosis not present
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
## 2023-07-11 ENCOUNTER — Ambulatory Visit: Payer: Self-pay | Admitting: Family Medicine

## 2023-07-11 ENCOUNTER — Telehealth: Payer: Self-pay

## 2023-07-11 DIAGNOSIS — E785 Hyperlipidemia, unspecified: Secondary | ICD-10-CM | POA: Diagnosis not present

## 2023-07-11 DIAGNOSIS — M797 Fibromyalgia: Secondary | ICD-10-CM | POA: Diagnosis not present

## 2023-07-11 DIAGNOSIS — G43909 Migraine, unspecified, not intractable, without status migrainosus: Secondary | ICD-10-CM | POA: Diagnosis not present

## 2023-07-11 DIAGNOSIS — M300 Polyarteritis nodosa: Secondary | ICD-10-CM | POA: Diagnosis not present

## 2023-07-11 DIAGNOSIS — E559 Vitamin D deficiency, unspecified: Secondary | ICD-10-CM | POA: Diagnosis not present

## 2023-07-11 DIAGNOSIS — K219 Gastro-esophageal reflux disease without esophagitis: Secondary | ICD-10-CM | POA: Diagnosis not present

## 2023-07-11 DIAGNOSIS — L400 Psoriasis vulgaris: Secondary | ICD-10-CM | POA: Diagnosis not present

## 2023-07-11 DIAGNOSIS — I5033 Acute on chronic diastolic (congestive) heart failure: Secondary | ICD-10-CM | POA: Diagnosis not present

## 2023-07-11 DIAGNOSIS — Z7951 Long term (current) use of inhaled steroids: Secondary | ICD-10-CM | POA: Diagnosis not present

## 2023-07-11 DIAGNOSIS — Z79899 Other long term (current) drug therapy: Secondary | ICD-10-CM | POA: Diagnosis not present

## 2023-07-11 DIAGNOSIS — E538 Deficiency of other specified B group vitamins: Secondary | ICD-10-CM | POA: Diagnosis not present

## 2023-07-11 DIAGNOSIS — K760 Fatty (change of) liver, not elsewhere classified: Secondary | ICD-10-CM | POA: Diagnosis not present

## 2023-07-11 DIAGNOSIS — G9529 Other cord compression: Secondary | ICD-10-CM | POA: Diagnosis not present

## 2023-07-11 DIAGNOSIS — J45901 Unspecified asthma with (acute) exacerbation: Secondary | ICD-10-CM | POA: Diagnosis not present

## 2023-07-11 DIAGNOSIS — M4802 Spinal stenosis, cervical region: Secondary | ICD-10-CM | POA: Diagnosis not present

## 2023-07-11 DIAGNOSIS — G629 Polyneuropathy, unspecified: Secondary | ICD-10-CM | POA: Diagnosis not present

## 2023-07-11 DIAGNOSIS — I872 Venous insufficiency (chronic) (peripheral): Secondary | ICD-10-CM | POA: Diagnosis not present

## 2023-07-11 DIAGNOSIS — G4733 Obstructive sleep apnea (adult) (pediatric): Secondary | ICD-10-CM | POA: Diagnosis not present

## 2023-07-11 DIAGNOSIS — I110 Hypertensive heart disease with heart failure: Secondary | ICD-10-CM | POA: Diagnosis not present

## 2023-07-11 DIAGNOSIS — Z87440 Personal history of urinary (tract) infections: Secondary | ICD-10-CM | POA: Diagnosis not present

## 2023-07-11 DIAGNOSIS — G894 Chronic pain syndrome: Secondary | ICD-10-CM | POA: Diagnosis not present

## 2023-07-11 NOTE — Telephone Encounter (Signed)
 Discussed with Archie Patten, per her recommendation Called Nurse [name] about a sample she cannot collect one today but could tomorrow (we need to call her back and confirm if we want this done tomorrow) [name] noted Dot denied any and all UTI sxs yesterday and so she iv concerned something more is going on and wondered if we needed to do an in office exam or something further please advise

## 2023-07-11 NOTE — Telephone Encounter (Signed)
 I cannot treat w/o a UA and culture.  If there is concern for other source of infxn, she will need to be evaluated.  I know that she is bed bound and transportation is difficult, but we cannot treat via phone.

## 2023-07-11 NOTE — Telephone Encounter (Signed)
 Chief Complaint: severe abdominal pain Symptoms: severe mid abdominal pain (constant x4.5 hours), nausea Frequency: since last night Pertinent Negatives: Patient denies fever, back pain, blood in urine, painful urination, vomiting Disposition: [x] ED /[] Urgent Care (no appt availability in office) / [] Appointment(In office/virtual)/ []  [facility]/ [] Home Care/ [x] Refused Recommended Disposition /[] [facility]/ []  Follow-up with PCP Additional Notes: Patient states she had a UTI a couple of months ago and this feels exactly like this. Patient states she is bedbound, would like something sent in for her symptoms without having to be seen. Advised ED. Patient states she "will go if I have to go". Patient refusing ED at this time and would like follow up from her PCP. Called [facility], and spoke with staff to inform that patient refused ED disposition.  Copied from CRM [id number]. Topic: Clinical - Red Word Triage >> Jul 11, 2023 11:15 AM Drema Balzarine wrote: Red Word that prompted transfer to Nurse Triage: Patient is having bad cramping, says she's in a lot of pain 8/10. She previously had a UTI, took medication, now she thinks it is back Reason for Disposition  [1] SEVERE pain (e.g., excruciating) AND [2] present > 1 hour  Answer Assessment - Initial Assessment Questions 1. LOCATION: "Where does it hurt?"      Abdominal pain near belly button across the left and right.  2. RADIATION: "Does the pain shoot anywhere else?" (e.g., chest, back)     Denies.  3. ONSET: "When did the pain begin?" (e.g., minutes, hours or days ago)      Last night.  4. SUDDEN: "Gradual or sudden onset?"     Gradual.  5. PATTERN "Does the pain come and go, or is it constant?"    - If it comes and goes: "How long does it last?" "Do you have pain now?"     (Note: Comes and goes means the pain is intermittent. It goes away completely between bouts.)    - If constant: "Is it getting better, staying the  same, or getting worse?"      (Note: Constant means the pain never goes away completely; most serious pain is constant and gets worse.)      "Steady", staying the same since last night.  6. SEVERITY: "How bad is the pain?"  (e.g., Scale 1-10; mild, moderate, or severe)    - MILD (1-3): Doesn't interfere with normal activities, abdomen soft and not tender to touch.     - MODERATE (4-7): Interferes with normal activities or awakens from sleep, abdomen tender to touch.     - SEVERE (8-10): Excruciating pain, doubled over, unable to do any normal activities.       Cramping, 8/10. Patient states she took hydrocodone at 7am.  7. RECURRENT SYMPTOM: "Have you ever had this type of stomach pain before?" If Yes, ask: "When was the last time?" and "What happened that time?"      Yes, same exact pain when she had a UTI a couple of months ago.  8. CAUSE: "What do you think is causing the stomach pain?"     Patient states she had a UTI a couple of months ago, completed her antibiotics and felt better. She states this feels like the pain she had with her UTI.  9. RELIEVING/AGGRAVATING FACTORS: "What makes it better or worse?" (e.g., antacids, bending or twisting motion, bowel movement)     She states yesterday she was able to lay back and it eased the pain somewhat.  10. OTHER SYMPTOMS: "Do you have any other symptoms?" (e.g., back pain, diarrhea, fever, urination pain, vomiting)       Nausea.  Protocols used: Abdominal Pain - Female-A-AH

## 2023-07-11 NOTE — Telephone Encounter (Signed)
 Copied from CRM [id number]. Topic: General - Other >> Jul 11, 2023 11:20 AM Drema Balzarine wrote: Reason for CRM: Patient returned Mackenzie's phone call

## 2023-07-12 ENCOUNTER — Inpatient Hospital Stay (HOSPITAL_COMMUNITY)
Admission: EM | Admit: 2023-07-12 | Discharge: 2023-07-17 | DRG: 389 | Disposition: A | Discharge status: Home Health | Payer: 59 | Attending: Internal Medicine | Admitting: Internal Medicine

## 2023-07-12 ENCOUNTER — Emergency Department (HOSPITAL_COMMUNITY): Payer: 59

## 2023-07-12 ENCOUNTER — Encounter (HOSPITAL_COMMUNITY): Payer: Self-pay

## 2023-07-12 ENCOUNTER — Other Ambulatory Visit: Payer: Self-pay

## 2023-07-12 ENCOUNTER — Encounter: Payer: Self-pay | Admitting: Family Medicine

## 2023-07-12 DIAGNOSIS — I110 Hypertensive heart disease with heart failure: Secondary | ICD-10-CM | POA: Diagnosis not present

## 2023-07-12 DIAGNOSIS — E662 Morbid (severe) obesity with alveolar hypoventilation: Secondary | ICD-10-CM | POA: Diagnosis present

## 2023-07-12 DIAGNOSIS — K567 Ileus, unspecified: Secondary | ICD-10-CM | POA: Diagnosis present

## 2023-07-12 DIAGNOSIS — Z833 Family history of diabetes mellitus: Secondary | ICD-10-CM | POA: Diagnosis not present

## 2023-07-12 DIAGNOSIS — Z9981 Dependence on supplemental oxygen: Secondary | ICD-10-CM

## 2023-07-12 DIAGNOSIS — I471 Supraventricular tachycardia, unspecified: Secondary | ICD-10-CM | POA: Diagnosis not present

## 2023-07-12 DIAGNOSIS — M797 Fibromyalgia: Secondary | ICD-10-CM | POA: Diagnosis not present

## 2023-07-12 DIAGNOSIS — Z66 Do not resuscitate: Secondary | ICD-10-CM | POA: Diagnosis present

## 2023-07-12 DIAGNOSIS — J811 Chronic pulmonary edema: Secondary | ICD-10-CM | POA: Diagnosis not present

## 2023-07-12 DIAGNOSIS — Z881 Allergy status to other antibiotic agents status: Secondary | ICD-10-CM

## 2023-07-12 DIAGNOSIS — Z87440 Personal history of urinary (tract) infections: Secondary | ICD-10-CM

## 2023-07-12 DIAGNOSIS — E871 Hypo-osmolality and hyponatremia: Secondary | ICD-10-CM | POA: Diagnosis present

## 2023-07-12 DIAGNOSIS — Z886 Allergy status to analgesic agent status: Secondary | ICD-10-CM

## 2023-07-12 DIAGNOSIS — Z8249 Family history of ischemic heart disease and other diseases of the circulatory system: Secondary | ICD-10-CM | POA: Diagnosis not present

## 2023-07-12 DIAGNOSIS — B9620 Unspecified Escherichia coli [E. coli] as the cause of diseases classified elsewhere: Secondary | ICD-10-CM | POA: Diagnosis present

## 2023-07-12 DIAGNOSIS — Z6841 Body Mass Index (BMI) 40.0 and over, adult: Secondary | ICD-10-CM

## 2023-07-12 DIAGNOSIS — K56609 Unspecified intestinal obstruction, unspecified as to partial versus complete obstruction: Secondary | ICD-10-CM | POA: Diagnosis not present

## 2023-07-12 DIAGNOSIS — K5651 Intestinal adhesions [bands], with partial obstruction: Principal | ICD-10-CM | POA: Diagnosis present

## 2023-07-12 DIAGNOSIS — K6389 Other specified diseases of intestine: Secondary | ICD-10-CM | POA: Diagnosis not present

## 2023-07-12 DIAGNOSIS — Z7401 Bed confinement status: Secondary | ICD-10-CM | POA: Diagnosis not present

## 2023-07-12 DIAGNOSIS — G9589 Other specified diseases of spinal cord: Secondary | ICD-10-CM | POA: Diagnosis present

## 2023-07-12 DIAGNOSIS — J9811 Atelectasis: Secondary | ICD-10-CM | POA: Diagnosis not present

## 2023-07-12 DIAGNOSIS — E785 Hyperlipidemia, unspecified: Secondary | ICD-10-CM | POA: Diagnosis present

## 2023-07-12 DIAGNOSIS — R1084 Generalized abdominal pain: Secondary | ICD-10-CM | POA: Diagnosis not present

## 2023-07-12 DIAGNOSIS — I10 Essential (primary) hypertension: Secondary | ICD-10-CM | POA: Diagnosis not present

## 2023-07-12 DIAGNOSIS — N390 Urinary tract infection, site not specified: Secondary | ICD-10-CM | POA: Diagnosis not present

## 2023-07-12 DIAGNOSIS — R109 Unspecified abdominal pain: Secondary | ICD-10-CM | POA: Diagnosis not present

## 2023-07-12 DIAGNOSIS — Z7951 Long term (current) use of inhaled steroids: Secondary | ICD-10-CM | POA: Diagnosis not present

## 2023-07-12 DIAGNOSIS — K5669 Other partial intestinal obstruction: Secondary | ICD-10-CM | POA: Diagnosis not present

## 2023-07-12 DIAGNOSIS — K219 Gastro-esophageal reflux disease without esophagitis: Secondary | ICD-10-CM | POA: Diagnosis not present

## 2023-07-12 DIAGNOSIS — Z8541 Personal history of malignant neoplasm of cervix uteri: Secondary | ICD-10-CM

## 2023-07-12 DIAGNOSIS — R140 Abdominal distension (gaseous): Secondary | ICD-10-CM | POA: Diagnosis not present

## 2023-07-12 DIAGNOSIS — R0602 Shortness of breath: Secondary | ICD-10-CM | POA: Diagnosis not present

## 2023-07-12 DIAGNOSIS — E876 Hypokalemia: Secondary | ICD-10-CM | POA: Diagnosis not present

## 2023-07-12 DIAGNOSIS — I5032 Chronic diastolic (congestive) heart failure: Secondary | ICD-10-CM | POA: Diagnosis present

## 2023-07-12 DIAGNOSIS — R5383 Other fatigue: Secondary | ICD-10-CM | POA: Diagnosis not present

## 2023-07-12 DIAGNOSIS — Z79899 Other long term (current) drug therapy: Secondary | ICD-10-CM

## 2023-07-12 DIAGNOSIS — Z888 Allergy status to other drugs, medicaments and biological substances status: Secondary | ICD-10-CM

## 2023-07-12 DIAGNOSIS — Z8042 Family history of malignant neoplasm of prostate: Secondary | ICD-10-CM

## 2023-07-12 DIAGNOSIS — Z90710 Acquired absence of both cervix and uterus: Secondary | ICD-10-CM

## 2023-07-12 DIAGNOSIS — Z882 Allergy status to sulfonamides status: Secondary | ICD-10-CM

## 2023-07-12 DIAGNOSIS — E878 Other disorders of electrolyte and fluid balance, not elsewhere classified: Secondary | ICD-10-CM | POA: Diagnosis present

## 2023-07-12 DIAGNOSIS — R531 Weakness: Secondary | ICD-10-CM | POA: Diagnosis not present

## 2023-07-12 DIAGNOSIS — R0989 Other specified symptoms and signs involving the circulatory and respiratory systems: Secondary | ICD-10-CM | POA: Diagnosis not present

## 2023-07-12 DIAGNOSIS — Z9884 Bariatric surgery status: Secondary | ICD-10-CM

## 2023-07-12 DIAGNOSIS — Z9049 Acquired absence of other specified parts of digestive tract: Secondary | ICD-10-CM

## 2023-07-12 DIAGNOSIS — Z1624 Resistance to multiple antibiotics: Secondary | ICD-10-CM | POA: Diagnosis not present

## 2023-07-12 DIAGNOSIS — J45909 Unspecified asthma, uncomplicated: Secondary | ICD-10-CM | POA: Diagnosis not present

## 2023-07-12 DIAGNOSIS — Z825 Family history of asthma and other chronic lower respiratory diseases: Secondary | ICD-10-CM

## 2023-07-12 DIAGNOSIS — Z803 Family history of malignant neoplasm of breast: Secondary | ICD-10-CM

## 2023-07-12 LAB — COMPREHENSIVE METABOLIC PANEL
ALT: 20 U/L (ref 0–44)
AST: 25 U/L (ref 15–41)
Albumin: 2.7 g/dL — ABNORMAL LOW (ref 3.5–5.0)
Alkaline Phosphatase: 82 U/L (ref 38–126)
BUN: 11 mg/dL (ref 6–20)
CO2: >45 mmol/L — ABNORMAL HIGH (ref 22–32)
Calcium: 8.4 mg/dL — ABNORMAL LOW (ref 8.9–10.3)
Chloride: 74 mmol/L — ABNORMAL LOW (ref 98–111)
Creatinine, Ser: 0.69 mg/dL (ref 0.44–1.00)
GFR, Estimated: >60 mL/min (ref >60)
Glucose, Bld: 93 mg/dL (ref 70–99)
Potassium: 2.5 mmol/L — CL (ref 3.5–5.1)
Sodium: 132 mmol/L — ABNORMAL LOW (ref 135–145)
Total Bilirubin: 0.8 mg/dL (ref 0.0–1.2)
Total Protein: 6.5 g/dL (ref 6.5–8.1)

## 2023-07-12 LAB — CBC
HCT: 41 % (ref 36.0–46.0)
Hemoglobin: 13.2 g/dL (ref 12.0–15.0)
MCH: 28.9 pg (ref 26.0–34.0)
MCHC: 32.2 g/dL (ref 30.0–36.0)
MCV: 89.7 fL (ref 80.0–100.0)
Platelets: 302 10*3/uL (ref 150–400)
RBC: 4.57 MIL/uL (ref 3.87–5.11)
RDW: 12.7 % (ref 11.5–15.5)
WBC: 8.6 10*3/uL (ref 4.0–10.5)
nRBC: 0 % (ref 0.0–0.2)

## 2023-07-12 MED ORDER — HYDROMORPHONE HCL 1 MG/ML IJ SOLN
0.5000 mg | Freq: Once | INTRAMUSCULAR | Status: AC
  Administered 2023-07-13: 0.5 mg via INTRAVENOUS
  Filled 2023-07-12: qty 1

## 2023-07-12 MED ORDER — POTASSIUM CHLORIDE 10 MEQ/100ML IV SOLN
10.0000 meq | INTRAVENOUS | Status: AC
  Administered 2023-07-12 – 2023-07-13 (×3): 10 meq via INTRAVENOUS
  Filled 2023-07-12 (×2): qty 100

## 2023-07-12 MED ORDER — MAGNESIUM OXIDE -MG SUPPLEMENT 400 (240 MG) MG PO TABS
400.0000 mg | ORAL_TABLET | Freq: Once | ORAL | Status: AC
  Administered 2023-07-12: 400 mg via ORAL
  Filled 2023-07-12: qty 1

## 2023-07-12 MED ORDER — POTASSIUM CHLORIDE CRYS ER 20 MEQ PO TBCR
40.0000 meq | EXTENDED_RELEASE_TABLET | Freq: Once | ORAL | Status: AC
  Administered 2023-07-12: 40 meq via ORAL
  Filled 2023-07-12: qty 2

## 2023-07-12 NOTE — Assessment & Plan Note (Signed)
 Chronic resulting in bedbound status

## 2023-07-12 NOTE — H&P (Signed)
 [name] ZOX:[id number] DOB: 10-Dec-1970 DOA: 07/12/2023     PCP: Sheliah Hatch, MD   Outpatient Specialists:     Pulmonary    Dr. Lanna Poche    Patient arrived to ER on 07/12/23 at 1631 Referred by Attending Coral Spikes, DO   Patient coming from:    home Lives  With family     Chief Complaint:   Chief Complaint  Patient presents with   Abdominal Pain   Flank Pain    HPI: [name] is a 53 y.o. female with medical history significant of morbid obesity bedbound myelomalacia of cervical cord sleep apnea hypertension hyperlipidemia fibromyalgia GERD    Presented with foul-smelling urine confusion Right lower quadrant abdominal pain confusion patient baseline bedbound wears 2 L of O2 EMS noted foul-smelling urine patient saturated in urine   Last BM was yesterday  Denies significant ETOH intake   Does not smoke   Lab Results  Component Value Date   SARSCOV2NAA NEGATIVE 11/20/2022   SARSCOV2NAA NEGATIVE 04/21/2022   SARSCOV2NAA NEGATIVE 08/12/2021   SARSCOV2NAA NEGATIVE 06/08/2021        Regarding pertinent Chronic problems:    Hyperlipidemia -  not on statins  Lipid Panel     Component Value Date/Time   CHOL 171 05/06/2022 1150   TRIG 142 05/06/2022 1150   HDL 49 (L) 05/06/2022 1150   CHOLHDL 3.5 05/06/2022 1150   VLDL 11.6 09/04/2020 0838   LDLCALC 98 05/06/2022 1150   LDLDIRECT 76.0 11/24/2015 1148    HTN:- Bystolic   chronic CHF diastolic  - last echo  Recent Results (from the past 81191 hours)  ECHOCARDIOGRAM COMPLETE   Collection Time: 11/21/22  9:55 AM  Result Value   BP 133/77   S' Lateral 3.90   Est EF 60 - 65%   Narrative      ECHOCARDIOGRAM REPORT        1. Left ventricular ejection fraction, by estimation, is 60 to 65%. The left ventricle has normal function. The left ventricle has no regional wall motion abnormalities. Left ventricular diastolic function could not be evaluated.  2. Right ventricular systolic function is  normal. The right ventricular size is normal.  3. The mitral valve is normal in structure. No evidence of mitral valve regurgitation.  4. The aortic valve was not well visualized. Aortic valve regurgitation is not visualized. No aortic stenosis is present.            Morbid obesity-   BMI Readings from Last 1 Encounters:  07/12/23 70.86 kg/m       Asthma -well   controlled on home inhalers/ nebs                   on Advair 250 and albuterol inhaler and nebulizer as needed  Followed by pulmonology      [name] -on nocturnal oxygen, had problems with CPAP    While in ER: Clinical Course as of 07/12/23 2222  Wed Jul 12, 2023  2137 Complianing of worsening abd pain. Will give pain medications. CT reviewed. Has what appears to be SBO. Given low potassium and CT scans with patient's comorbid past medical history will admit for further management of SBO and hypokalemia. [TY]    Clinical Course User Index [TY] Coral Spikes, DO       Lab Orders         CBC         Urinalysis, Routine w reflex  microscopic -Urine, Clean Catch         Comprehensive metabolic panel         I-Stat CG4 Lactic Acid        CTabd/pelvis -partial small bowel obstruction versus ileus chronic T12 compression fracture    Following Medications were ordered in ER: Medications  potassium chloride 10 mEq in 100 mL IVPB (0 mEq Intravenous Stopped 07/12/23 2211)  HYDROmorphone (DILAUDID) injection 0.5 mg (has no administration in time range)  potassium chloride SA (KLOR-CON M) CR tablet 40 mEq (40 mEq Oral Given 07/12/23 1806)  magnesium oxide (MAG-OX) tablet 400 mg (400 mg Oral Given 07/12/23 1806)       ED Triage Vitals  Encounter Vitals Group     BP 07/12/23 1647 131/76     Systolic BP Percentile --      Diastolic BP Percentile --      Pulse Rate 07/12/23 1647 73     Resp 07/12/23 1647 16     Temp 07/12/23 1647 98.6 F (37 C)     Temp src --      SpO2 07/12/23 1636 96 %     Weight 07/12/23 1640 (!)  375 lb (170.1 kg)     Height 07/12/23 1640 5\' 1"  (1.549 m)     Head Circumference --      Peak Flow --      Pain Score 07/12/23 1640 0     Pain Loc --      Pain Education --      Exclude from Growth Chart --   ZOXW(96)@     _________________________________________ Significant initial  Findings: Abnormal Labs Reviewed  COMPREHENSIVE METABOLIC PANEL - Abnormal; Notable for the following components:      Result Value   Sodium 132 (*)    Potassium 2.5 (*)    Chloride 74 (*)    CO2 >45 (*)    Calcium 8.4 (*)    Albumin 2.7 (*)    All other components within normal limits        ECG: Ordered  BNP (last 3 results) Recent Labs    11/20/22 1902 01/12/23 1611  BNP 34.5 14.7     COVID-19 Labs  No results for input(s): "DDIMER", "FERRITIN", "LDH", "CRP" in the last 72 hours.  Lab Results  Component Value Date   SARSCOV2NAA NEGATIVE 11/20/2022   SARSCOV2NAA NEGATIVE 04/21/2022   SARSCOV2NAA NEGATIVE 08/12/2021   SARSCOV2NAA NEGATIVE 06/08/2021     The recent clinical data is shown below. Vitals:   07/12/23 1636 07/12/23 1640 07/12/23 1647  BP:   131/76  Pulse:   73  Resp:   16  Temp:   98.6 F (37 C)  SpO2: 96%  96%  Weight:  (!) 170.1 kg   Height:  5\' 1"  (1.549 m)     WBC     Component Value Date/Time   WBC 8.6 07/12/2023 1702   LYMPHSABS 1.3 01/12/2023 1611   LYMPHSABS 2.8 11/03/2011 1036   MONOABS 0.6 01/12/2023 1611   EOSABS 0.4 01/12/2023 1611   EOSABS 0.4 11/03/2011 1036   BASOSABS 0.0 01/12/2023 1611   BASOSABS 0.0 11/03/2011 1036    Lactic Acid, Venous    Component Value Date/Time   LATICACIDVEN 1.8 04/22/2022 0513      UA ordered    Results for orders placed or performed during the hospital encounter of 11/20/22  SARS Coronavirus 2 by RT PCR (hospital order, performed in [facility] hospital lab) *cepheid single result  test* Anterior Nasal Swab     Status: None   Collection Time: 11/20/22  7:02 PM   Specimen: Anterior Nasal Swab  Result  Value Ref Range Status   SARS Coronavirus 2 by RT PCR NEGATIVE NEGATIVE Final    Comment: Performed at [facility], [address], [place], [place] [address]   *Note: Due to a large number of results and/or encounters for the requested time period, some results have not been displayed. A complete set of results can be found in Results Review.    VBg pending ______________________________________________ Recent Labs  Lab 07/12/23 1702  NA 132*  K 2.5*  CO2 >45*  GLUCOSE 93  BUN 11  CREATININE 0.69  CALCIUM 8.4*    Cr   stable,    Lab Results  Component Value Date   CREATININE 0.69 07/12/2023   CREATININE 0.66 01/14/2023   CREATININE 0.73 01/13/2023    Recent Labs  Lab 07/12/23 1702  AST 25  ALT 20  ALKPHOS 82  BILITOT 0.8  PROT 6.5  ALBUMIN 2.7*   Lab Results  Component Value Date   CALCIUM 8.4 (L) 07/12/2023   PHOS 4.8 (H) 04/22/2022    Plt: Lab Results  Component Value Date   PLT 302 07/12/2023    Recent Labs  Lab 07/12/23 1702  WBC 8.6  HGB 13.2  HCT 41.0  MCV 89.7  PLT 302    HG/HCT   stable     Component Value Date/Time   HGB 13.2 07/12/2023 1702   HGB 13.9 11/03/2011 1036   HCT 41.0 07/12/2023 1702   HCT 42.8 11/03/2011 1036   MCV 89.7 07/12/2023 1702   MCV 89 11/03/2011 1036       ____________________________________________ Hospitalist was called for admission for   SBO  Hypokalemia    The following Work up has been ordered so far:  Orders Placed This Encounter  Procedures   CT Renal Stone Study   CBC   Urinalysis, Routine w reflex microscopic -Urine, Clean Catch   Comprehensive metabolic panel   In and Out Cath   Consult for Unassigned Medical Admission   I-Stat CG4 Lactic Acid   EKG 12-Lead      Cultures:    Component Value Date/Time   SDES BLOOD SITE NOT SPECIFIED 04/23/2022 0021   SPECREQUEST  04/23/2022 0021    BOTTLES DRAWN AEROBIC ONLY Blood Culture adequate volume   CULT  04/23/2022 0021    NO GROWTH 5  DAYS Performed at [facility], [address] [place], [place] [address]    REPTSTATUS 04/28/2022 FINAL 04/23/2022 0021     Radiological Exams on Admission: CT Renal Stone Study Result Date: 07/12/2023 CLINICAL DATA:  Flank pain. EXAM: CT ABDOMEN AND PELVIS WITHOUT CONTRAST TECHNIQUE: Multidetector CT imaging of the abdomen and pelvis was performed following the standard protocol without IV contrast. RADIATION DOSE REDUCTION: This exam was performed according to the departmental dose-optimization program which includes automated exposure control, adjustment of the mA and/or kV according to patient size and/or use of iterative reconstruction technique. COMPARISON:  November 09, 2011 FINDINGS: Lower chest: Mild to moderate severity scarring and/or atelectasis is seen within the bilateral lung bases. Hepatobiliary: No focal liver abnormality is seen. Status post cholecystectomy. No biliary dilatation. Pancreas: Unremarkable. No pancreatic ductal dilatation or surrounding inflammatory changes. Spleen: Normal in size without focal abnormality. Adrenals/Urinary Tract: Adrenal glands are unremarkable. Kidneys are normal, without renal calculi, focal lesion, or hydronephrosis. Bladder is unremarkable.  Stomach/Bowel: Surgical sutures are seen throughout the gastric region. The appendix is not clearly identified. Numerous dilated small bowel loops are seen throughout the abdomen and pelvis (maximum small bowel diameter of approximately 5.3 cm). A clear transition zone is not identified. Vascular/Lymphatic: No significant vascular findings are present. No enlarged abdominal or pelvic lymph nodes. Reproductive: Status post hysterectomy. No adnexal masses. Other: No abdominal wall hernia or abnormality. No abdominopelvic ascites. Musculoskeletal: A chronic appearing compression fracture deformity is present at the level of T12. Multilevel degenerative changes are seen throughout the lumbar spine. IMPRESSION: 1.  Findings consistent with a partial small bowel obstruction versus ileus. 2. Evidence of prior cholecystectomy, gastric surgery and hysterectomy. 3. Chronic appearing compression fracture deformity at the level of T12. Electronically Signed   By: Aram Candela M.D.   On: 07/12/2023 19:04   _______________________________________________________________________________________________________ Latest  Blood pressure 131/76, pulse 73, temperature 98.6 F (37 C), resp. rate 16, height 5\' 1"  (1.549 m), weight (!) 170.1 kg, SpO2 96%.   Vitals  labs and radiology finding personally reviewed  Review of Systems:    Pertinent positives include:  confusion no BM for the past 1 day  dysuria,  Constitutional:  No weight loss, night sweats, Fevers, chills, fatigue, weight loss  HEENT:  No headaches, Difficulty swallowing,Tooth/dental problems,Sore throat,  No sneezing, itching, ear ache, nasal congestion, post nasal drip,  Cardio-vascular:  No chest pain, Orthopnea, PND, anasarca, dizziness, palpitations.no Bilateral lower extremity swelling  GI:  No heartburn, indigestion, abdominal pain, nausea, vomiting, diarrhea, change in bowel habits, loss of appetite, melena, blood in stool, hematemesis Resp:  no shortness of breath at rest. No dyspnea on exertion, No excess mucus, no productive cough, No non-productive cough, No coughing up of blood.No change in color of mucus.No wheezing. Skin:  no rash or lesions. No jaundice GU:  nochange in color of urine, no urgency or frequency. No straining to urinate.  No flank pain.  Musculoskeletal:  No joint pain or no joint swelling. No decreased range of motion. No back pain.  Psych:  No change in mood or affect. No depression or anxiety. No memory loss.  Neuro: no localizing neurological complaints, no tingling, no weakness, no double vision, no gait abnormality, no slurred speech, no   All systems reviewed and apart from HOPI all are  negative _______________________________________________________________________________________________ Past Medical History:   Past Medical History:  Diagnosis Date   Allergic rhinitis    Ankle fracture, right    Anxiety    ASCUS of cervix with negative high risk HPV 05/2018   Carpal tunnel syndrome    Cervical cancer (HCC) 1998   Fibromyalgia    Hyperlipidemia    Hypertension    Migraine headache    Obesity    Sleep apnea     Past Surgical History:  Procedure Laterality Date   ABDOMINAL HYSTERECTOMY  10/1996   ABDOMINAL SURGERY  jan/11/2012   gastric bypass surgery   CARPAL TUNNEL RELEASE     bilateral    CESAREAN SECTION     CHOLECYSTECTOMY  07/1992   ESOPHAGOGASTRODUODENOSCOPY N/A 01/14/2016   Procedure: ESOPHAGOGASTRODUODENOSCOPY (EGD);  Surgeon: Napoleon Form, MD;  Location: [facility] ENDOSCOPY;  Service: Endoscopy;  Laterality: N/A;    Social History:  Ambulatory  bed bound     reports that she has never smoked. She has never used smokeless tobacco. She reports that she does not drink alcohol and does not use drugs.   Family History:  Family History  Problem Relation  Age of Onset   Diabetes Mother    Emphysema Mother    Allergies Mother    Hypertension Mother    Heart disease Father    Allergies Father    Prostate cancer Father    Breast cancer Maternal Grandmother 50   ______________________________________________________________________________________________ Allergies: Allergies  Allergen Reactions   Erythromycin Anaphylaxis   Niacin Anaphylaxis, Shortness Of Breath and Swelling    Swelling and "problems breathing"   Sulfamethoxazole-Trimethoprim Anaphylaxis, Swelling, Rash and Other (See Comments)    Throat closed and the eyes became swollen   Aspirin Hives   Bactrim Hives   Benzoin Compound Rash   Cephalexin Hives    Tolerated ceftriaxone and cefepime 07/2019   Iohexol Hives    Pt treated with PO benedryl   Lisinopril Hives   Naproxen Hives    Pnu-Imune [Pneumococcal Polysaccharide Vaccine] Rash   Sulfonamide Derivatives Rash    Prior to Admission medications   Medication Sig Start Date End Date Taking? Authorizing Provider  ALPRAZolam Prudy Feeler) 1 MG tablet TAKE 1 TABLET BY MOUTH 2 TIMES DAILY 07/05/23   Sheliah Hatch, MD  ARIPiprazole (ABILIFY) 15 MG tablet TAKE 1 TABLET BY MOUTH EVERY DAY 06/27/23   Sheliah Hatch, MD  Armodafinil 250 MG tablet Take 1 tablet (250 mg total) by mouth in the morning and at bedtime. 08/10/22   Sheliah Hatch, MD  Biotin 10 MG TABS Take 1 tablet (10 mg total) by mouth in the morning. 02/10/23   Sheliah Hatch, MD  buPROPion (WELLBUTRIN XL) 300 MG 24 hr tablet TAKE 1 TABLET BY MOUTH EVERY DAY 05/31/23   Sheliah Hatch, MD  calcium citrate (CALCITRATE - DOSED IN MG ELEMENTAL CALCIUM) 950 (200 Ca) MG tablet Take 1 tablet (200 mg of elemental calcium total) by mouth daily. 02/10/23   Sheliah Hatch, MD  clonazePAM (KLONOPIN) 1 MG tablet Take 1 tablet (1 mg total) by mouth at bedtime. 06/16/23   Sheliah Hatch, MD  cyanocobalamin (VITAMIN B12) 1000 MCG tablet TAKE 1 TABLET BY MOUTH ONCE DAILY 05/31/23   Sheliah Hatch, MD  cyclobenzaprine (FLEXERIL) 10 MG tablet TAKE 2 TABLETS BY MOUTH 2 TIMES DAILY 05/31/23   Sheliah Hatch, MD  diclofenac Sodium (VOLTAREN) 1 % GEL Apply 4 g topically 4 (four) times daily. 08/10/22   Sheliah Hatch, MD  DULoxetine (CYMBALTA) 60 MG capsule Take 2 capsules (120 mg total) by mouth daily. Patient taking differently: Take 60 mg by mouth 2 (two) times daily. Pt reported taking two capsules(120mg ) once daily 06/22/15   Sheliah Hatch, MD  FEROSUL 325 (65 Fe) MG tablet TAKE 1 TABLET BY MOUTH EVERY MORNING WITH BREAKFAST 05/31/23   Sheliah Hatch, MD  fluticasone-salmeterol (ADVAIR) 250-50 MCG/ACT AEPB Inhale 1 puff into the lungs every 12 (twelve) hours. 06/13/23   Luciano Cutter, MD  folic acid (FOLVITE) 1 MG tablet TAKE 1 TABLET BY  MOUTH ONCE DAILY 05/31/23   Sheliah Hatch, MD  furosemide (LASIX) 40 MG tablet Take 1 tablet by mouth daily. May take an additional tablet by mouth IF weight gain of 5 lbs and CALL MD for further instructions. 02/10/23   Sheliah Hatch, MD  gabapentin (NEURONTIN) 600 MG tablet Take 1 tablet (600 mg total) by mouth 2 (two) times daily as needed. Pt takes daily - can take up to twice daily 11/24/22   Noralee Stain, DO  GNP VITAMIN B-1 100 MG tablet TAKE 1 TABLET BY MOUTH  EVERY DAY 05/31/23   Sheliah Hatch, MD  HYDROcodone-acetaminophen (NORCO) 10-325 MG tablet TAKE 1 TABLET BY MOUTH EVERY 8 HOURS AS NEEDED 06/28/23   Sheliah Hatch, MD  ipratropium-albuterol (DUONEB) 0.5-2.5 (3) MG/3ML SOLN Take 3 mLs by nebulization every 6 (six) hours as needed (wheezing/shortness of breath). 01/14/23   [place], Eric J, DO  nebivolol (BYSTOLIC) 10 MG tablet TAKE 1 TABLET BY MOUTH EVERY DAY 05/31/23   Sheliah Hatch, MD  nystatin (MYCOSTATIN/NYSTOP) powder APPLY 1 APPLICATION TOPICALLY 3 TIMES DAILY Patient taking differently: Apply 1 Application topically 3 (three) times daily as needed (yeast infection). 08/17/22   Sheliah Hatch, MD  Oxcarbazepine (TRILEPTAL) 300 MG tablet Take 300 mg by mouth 2 (two) times daily.    [provider]  pantoprazole (PROTONIX) 40 MG tablet TAKE 1 TABLET BY MOUTH 2 TIMES DAILY 05/31/23   Sheliah Hatch, MD  phentermine 37.5 MG capsule Take 1 capsule (37.5 mg total) by mouth every morning. 06/28/23   Sheliah Hatch, MD  potassium chloride (KLOR-CON) 20 MEQ packet Take 1 packet by mouth daily. Take with lasix. 11/24/22 01/23/23  Noralee Stain, DO  promethazine (PHENERGAN) 25 MG tablet TAKE 1 TABLET BY MOUTH EVERY 6 HOURS AS NEEDED FOR NAUSEA AND VOMITING 04/27/23   Sheliah Hatch, MD  promethazine-dextromethorphan (PROMETHAZINE-DM) 6.25-15 MG/5ML syrup Take 5 mLs by mouth 4 (four) times daily as needed. 06/26/23   Sheliah Hatch, MD   pyridOXINE (VITAMIN B6) 100 MG tablet TAKE 1 TABLET BY MOUTH ONCE DAILY 05/31/23   Sheliah Hatch, MD    ___________________________________________________________________________________________________ Physical Exam:    07/12/2023    4:47 PM 07/12/2023    4:40 PM 01/20/2023   10:45 AM  Vitals with BMI  Height  5\' 1"  5\' 1"   Weight  375 lbs 375 lbs  BMI  70.89 70.89  Systolic 131    Diastolic 76    Pulse 73      1. General:  in No  Acute distress    Chronically ill -appearing 2. Psychological: Alert and   Oriented 3. Head/ENT:   Dry Mucous Membranes                          Head Non traumatic, neck supple                          Poor Dentition 4. SKIN: decreased Skin turgor,  Skin clean Dry and intact no rash    5. Heart: Regular rate and rhythm no  Murmur, no Rub or gallop 6. Lungs:   no wheezes or crackles   7. Abdomen: Soft,  non-tender,  distended   obese  bowel sounds decreased 8. Lower extremities: no clubbing, cyanosis, 1+edema 9. Neurologically Grossly intact, moving all 4 extremities equally   10. MSK: Normal range of motion    Chart has been reviewed  ______________________________________________________________________________________________  Assessment/Plan 53 y.o. female with medical history significant of morbid obesity bedbound myelomalacia of cervical cord sleep apnea hypertension hyperlipidemia fibromyalgia GERD   Admitted for   SBO , Hypokalemia     Present on Admission:  SBO (small bowel obstruction) (HCC)  Compressive cervical cord myelomalacia (HCC)  Essential hypertension  Hypokalemia  Obesity hypoventilation syndrome (HCC)     Bedbound Due to morbid obesity this will complicate medical management  Compressive cervical cord myelomalacia (HCC) Chronic resulting in bedbound status  Essential hypertension Allow  permissive hypertension for tonight  Hypokalemia Replace - will replace electrolytes and repeat  check Mg, phos and Ca  level and replace as needed Monitor on telemetry   Lab Results  Component Value Date   K 2.5 (LL) 07/12/2023     Lab Results  Component Value Date   CREATININE 0.69 07/12/2023   Lab Results  Component Value Date   MG 2.2 01/14/2023   Lab Results  Component Value Date   CALCIUM 8.4 (L) 07/12/2023   PHOS 4.8 (H) 04/22/2022     Obesity hypoventilation syndrome (HCC) Patient has not been able to use CPAP at home Given confusion will order VBG to evaluate if patient may need transient BiPAP  SBO (small bowel obstruction) (HCC)  Likely cause  adhesions?  - admit for conservative management  - NG tube significant nausea vomiting at this point we will hold off - NPO - KUB in AM -Requested General surgery consult.    Other plan as per orders.  DVT prophylaxis:  SCD     Code Status: DNR/DNI   as per patient  family  I had personally discussed CODE STATUS with patient and family   ACP   none   Family Communication:   Family  at  Bedside  plan of care was discussed  with   Son,    Diet npo sips with meds   Disposition Plan:      To home once workup is complete and patient is stable   Following barriers for discharge:                           SBO work up is complete                             Electrolytes corrected                                                            Pain controlled with PO medications                                                    Will need to be able to tolerate PO                                                    Will need consultants to evaluate patient prior to discharge                       Nutrition    consulted             Consults called: Requested ER to contact general surgery for consult given SBO   Admission status:  ED Disposition     ED Disposition  Admit   Condition  --   Comment  Hospital Area: [facility] [[id number]]  Level of Care: Progressive [102]  Admit to Progressive based on following  criteria: RESPIRATORY PROBLEMS hypoxemic/hypercapnic respiratory failure that is responsive to NIPPV (BiPAP) or High Flow Nasal Cannula (6-80 lpm). Frequent assessment/intervention, no > Q2 hrs < Q4 hrs, to maintain oxygenation and pulmonary hygiene.  Admit to Progressive based on following criteria: ACUTE MENTAL DISORDER-RELATED Drug/Alcohol Ingestion/Overdose/Withdrawal, Suicidal Ideation/attempt requiring safety sitter and < Q2h monitoring/assessments, moderate to severe agitation that is managed with medication/sitter, CIWA-Ar score < 20.  May admit patient to [facility] or [facility] if equivalent level of care is available:: No  Covid Evaluation: Asymptomatic - no recent exposure (last 10 days) testing not required  Diagnosis: SBO (small bowel obstruction) [facility]) [[id number]]  Admitting Physician: Therisa Doyne [[id number]]  Attending Physician: Therisa Doyne [[id number]]  Certification:: I certify this patient will need inpatient services for at least 2 midnights  Expected Medical Readiness: 07/15/2023            inpatient     I Expect 2 midnight stay secondary to severity of patient's current illness need for inpatient interventions justified by the following:     Severe lab/radiological/exam abnormalities including:  SBO hypokalemia   and extensive comorbidities including:   CHF asthma  Morbid Obesity   That are currently affecting medical management.   I expect  patient to be hospitalized for 2 midnights requiring inpatient medical care.  Patient is at high risk for adverse outcome (such as loss of life or disability) if not treated.  Indication for inpatient stay as follows:    inability to maintain oral hydration    Need for operative/procedural  intervention    Need for   IV fluids,    Level of care     tele  For 24H    Wesly Whisenant 07/12/2023, 11:07 PM    [facility]     after 2 AM please page floor coverage PA If 7AM-7PM, please contact the day team  taking care of the patient using [email or web address]

## 2023-07-12 NOTE — Telephone Encounter (Signed)
 Please call patient and make an appt NOT VIRTUAL so we can assess possible UTI sxs

## 2023-07-12 NOTE — ED Triage Notes (Addendum)
 Pt arrived via [facility] from home for c/o RLQ abd pain that radiates to right flankx3-4d. Per EMS, son told them pt has AMS. Pt wears 2L 02 at baseline. Pt is bedbound. Per EMS, pt has foul smelling urine. Per EMS, pt is saturated in urine. Pt is A&Ox4.

## 2023-07-12 NOTE — Subjective & Objective (Signed)
 Right lower quadrant abdominal pain confusion patient baseline bedbound wears 2 L of O2 EMS noted foul-smelling urine patient saturated in urine

## 2023-07-12 NOTE — ED Notes (Signed)
 Dr. Maple Hudson made aware about CT results called to this RN from [facility]

## 2023-07-12 NOTE — Assessment & Plan Note (Signed)
 Due to morbid obesity this will complicate medical management

## 2023-07-12 NOTE — Assessment & Plan Note (Signed)
 Replace - will replace electrolytes and repeat  check Mg, phos and Ca level and replace as needed Monitor on telemetry   Lab Results  Component Value Date   K 2.5 (LL) 07/12/2023     Lab Results  Component Value Date   CREATININE 0.69 07/12/2023   Lab Results  Component Value Date   MG 2.2 01/14/2023   Lab Results  Component Value Date   CALCIUM 8.4 (L) 07/12/2023   PHOS 4.8 (H) 04/22/2022

## 2023-07-12 NOTE — Assessment & Plan Note (Signed)
 Patient has not been able to use CPAP at home Given confusion will order VBG to evaluate if patient may need transient BiPAP

## 2023-07-12 NOTE — Assessment & Plan Note (Addendum)
 Likely cause  adhesions?  - admit for conservative management  - NG tube significant nausea vomiting at this point we will hold off - NPO - KUB in AM -Requested General surgery consult.

## 2023-07-12 NOTE — Telephone Encounter (Signed)
 Called patient no answer, LM to call back so we can ask about fax number she had stated she had for faxing the [facility] Bed order to and see if she had heard anything from the company about the bed since this was faxed several times previous by Lenard Simmer

## 2023-07-12 NOTE — ED Notes (Signed)
 Patient transported to CT

## 2023-07-12 NOTE — Assessment & Plan Note (Signed)
 Allow permissive hypertension for tonight

## 2023-07-13 ENCOUNTER — Inpatient Hospital Stay (HOSPITAL_COMMUNITY): Payer: 59

## 2023-07-13 DIAGNOSIS — K56609 Unspecified intestinal obstruction, unspecified as to partial versus complete obstruction: Secondary | ICD-10-CM | POA: Diagnosis not present

## 2023-07-13 LAB — URINALYSIS, ROUTINE W REFLEX MICROSCOPIC
Bilirubin Urine: NEGATIVE
Glucose, UA: NEGATIVE mg/dL
Hgb urine dipstick: NEGATIVE
Ketones, ur: NEGATIVE mg/dL
Leukocytes,Ua: NEGATIVE
Nitrite: POSITIVE — AB
Protein, ur: NEGATIVE mg/dL
Specific Gravity, Urine: 1.016 (ref 1.005–1.030)
pH: 5 (ref 5.0–8.0)

## 2023-07-13 LAB — CBC
HCT: 44.1 % (ref 36.0–46.0)
Hemoglobin: 14.1 g/dL (ref 12.0–15.0)
MCH: 29 pg (ref 26.0–34.0)
MCHC: 32 g/dL (ref 30.0–36.0)
MCV: 90.7 fL (ref 80.0–100.0)
Platelets: 317 10*3/uL (ref 150–400)
RBC: 4.86 MIL/uL (ref 3.87–5.11)
RDW: 12.9 % (ref 11.5–15.5)
WBC: 8 10*3/uL (ref 4.0–10.5)
nRBC: 0 % (ref 0.0–0.2)

## 2023-07-13 LAB — RENAL FUNCTION PANEL
Albumin: 2.6 g/dL — ABNORMAL LOW (ref 3.5–5.0)
Anion gap: 13 (ref 5–15)
BUN: 14 mg/dL (ref 6–20)
CO2: 42 mmol/L — ABNORMAL HIGH (ref 22–32)
Calcium: 8.2 mg/dL — ABNORMAL LOW (ref 8.9–10.3)
Chloride: 81 mmol/L — ABNORMAL LOW (ref 98–111)
Creatinine, Ser: 0.77 mg/dL (ref 0.44–1.00)
GFR, Estimated: >60 mL/min (ref >60)
Glucose, Bld: 87 mg/dL (ref 70–99)
Phosphorus: 3.4 mg/dL (ref 2.5–4.6)
Potassium: 2.9 mmol/L — ABNORMAL LOW (ref 3.5–5.1)
Sodium: 136 mmol/L (ref 135–145)

## 2023-07-13 LAB — MAGNESIUM
Magnesium: 2 mg/dL (ref 1.7–2.4)
Magnesium: 1.9 mg/dL (ref 1.7–2.4)

## 2023-07-13 LAB — BLOOD GAS, VENOUS
Acid-Base Excess: 28.1 mmol/L — ABNORMAL HIGH (ref 0.0–2.0)
Bicarbonate: 56 mmol/L — ABNORMAL HIGH (ref 20.0–28.0)
O2 Saturation: 92.4 %
Patient temperature: 37.6
pCO2, Ven: 69 mm[Hg] — ABNORMAL HIGH (ref 44–60)
pH, Ven: 7.52 — ABNORMAL HIGH (ref 7.25–7.43)
pO2, Ven: 59 mm[Hg] — ABNORMAL HIGH (ref 32–45)

## 2023-07-13 LAB — BASIC METABOLIC PANEL
Anion gap: 15 (ref 5–15)
BUN: 12 mg/dL (ref 6–20)
CO2: 43 mmol/L — ABNORMAL HIGH (ref 22–32)
Calcium: 8.5 mg/dL — ABNORMAL LOW (ref 8.9–10.3)
Chloride: 76 mmol/L — ABNORMAL LOW (ref 98–111)
Creatinine, Ser: 0.68 mg/dL (ref 0.44–1.00)
GFR, Estimated: >60 mL/min (ref >60)
Glucose, Bld: 102 mg/dL — ABNORMAL HIGH (ref 70–99)
Potassium: 2.6 mmol/L — CL (ref 3.5–5.1)
Sodium: 134 mmol/L — ABNORMAL LOW (ref 135–145)

## 2023-07-13 LAB — PHOSPHORUS
Phosphorus: 2.5 mg/dL (ref 2.5–4.6)
Phosphorus: 3.4 mg/dL (ref 2.5–4.6)

## 2023-07-13 LAB — I-STAT CG4 LACTIC ACID, ED: Lactic Acid, Venous: 0.7 mmol/L (ref 0.5–1.9)

## 2023-07-13 LAB — PREALBUMIN: Prealbumin: 8 mg/dL — ABNORMAL LOW (ref 18–38)

## 2023-07-13 LAB — TSH: TSH: 1.64 u[IU]/mL (ref 0.350–4.500)

## 2023-07-13 MED ORDER — POTASSIUM CHLORIDE IN NACL 20-0.9 MEQ/L-% IV SOLN
INTRAVENOUS | Status: DC
  Filled 2023-07-13: qty 1000

## 2023-07-13 MED ORDER — ACETAMINOPHEN 325 MG PO TABS: 650.0000 mg | ORAL_TABLET | Freq: Four times a day (QID) | ORAL | Status: DC | PRN

## 2023-07-13 MED ORDER — ONDANSETRON HCL 4 MG PO TABS
4.0000 mg | ORAL_TABLET | Freq: Four times a day (QID) | ORAL | Status: DC | PRN
  Administered 2023-07-13: 4 mg via ORAL
  Filled 2023-07-13: qty 1

## 2023-07-13 MED ORDER — SODIUM CHLORIDE 0.9 % IV SOLN: INTRAVENOUS | Status: AC

## 2023-07-13 MED ORDER — ARIPIPRAZOLE 5 MG PO TABS
15.0000 mg | ORAL_TABLET | Freq: Every day | ORAL | Status: DC
  Administered 2023-07-13 – 2023-07-17 (×5): 15 mg via ORAL
  Filled 2023-07-13 (×3): qty 3
  Filled 2023-07-13: qty 2
  Filled 2023-07-13: qty 3

## 2023-07-13 MED ORDER — FENTANYL CITRATE PF 50 MCG/ML IJ SOSY
12.5000 ug | PREFILLED_SYRINGE | INTRAMUSCULAR | Status: DC | PRN
  Administered 2023-07-13 – 2023-07-14 (×2): 50 ug via INTRAVENOUS
  Administered 2023-07-15: 25 ug via INTRAVENOUS
  Administered 2023-07-15 – 2023-07-16 (×2): 50 ug via INTRAVENOUS
  Filled 2023-07-13 (×6): qty 1

## 2023-07-13 MED ORDER — ALPRAZOLAM 0.5 MG PO TABS
1.0000 mg | ORAL_TABLET | Freq: Two times a day (BID) | ORAL | Status: DC
  Administered 2023-07-13 – 2023-07-17 (×10): 1 mg via ORAL
  Filled 2023-07-13 (×3): qty 2
  Filled 2023-07-13: qty 4
  Filled 2023-07-13: qty 2
  Filled 2023-07-13: qty 4
  Filled 2023-07-13 (×4): qty 2

## 2023-07-13 MED ORDER — SODIUM CHLORIDE 0.9 % IV SOLN
1.0000 g | INTRAVENOUS | Status: DC
  Administered 2023-07-13 – 2023-07-16 (×4): 1 g via INTRAVENOUS
  Filled 2023-07-13 (×4): qty 10

## 2023-07-13 MED ORDER — ACETAMINOPHEN 650 MG RE SUPP: 650.0000 mg | Freq: Four times a day (QID) | RECTAL | Status: DC | PRN

## 2023-07-13 MED ORDER — POTASSIUM CHLORIDE 20 MEQ PO PACK
40.0000 meq | PACK | ORAL | Status: AC
  Administered 2023-07-13 (×2): 40 meq via ORAL
  Filled 2023-07-13 (×2): qty 2

## 2023-07-13 MED ORDER — DIATRIZOATE MEGLUMINE & SODIUM 66-10 % PO SOLN
90.0000 mL | Freq: Once | ORAL | Status: AC
  Administered 2023-07-13: 90 mL via ORAL
  Filled 2023-07-13: qty 90

## 2023-07-13 MED ORDER — ONDANSETRON HCL 4 MG/2ML IJ SOLN: 4.0000 mg | Freq: Four times a day (QID) | INTRAMUSCULAR | Status: DC | PRN

## 2023-07-13 MED ORDER — DULOXETINE HCL 60 MG PO CPEP
60.0000 mg | ORAL_CAPSULE | Freq: Two times a day (BID) | ORAL | Status: DC
  Administered 2023-07-13 – 2023-07-17 (×10): 60 mg via ORAL
  Filled 2023-07-13 (×5): qty 1
  Filled 2023-07-13: qty 2
  Filled 2023-07-13 (×2): qty 1
  Filled 2023-07-13: qty 2
  Filled 2023-07-13: qty 1

## 2023-07-13 MED ORDER — IPRATROPIUM-ALBUTEROL 0.5-2.5 (3) MG/3ML IN SOLN
3.0000 mL | Freq: Four times a day (QID) | RESPIRATORY_TRACT | Status: DC
  Administered 2023-07-13 – 2023-07-16 (×13): 3 mL via RESPIRATORY_TRACT
  Filled 2023-07-13 (×13): qty 3

## 2023-07-13 MED ORDER — ALBUTEROL SULFATE (2.5 MG/3ML) 0.083% IN NEBU: 2.5000 mg | INHALATION_SOLUTION | RESPIRATORY_TRACT | Status: DC | PRN

## 2023-07-13 MED ORDER — MOMETASONE FURO-FORMOTEROL FUM 200-5 MCG/ACT IN AERO
2.0000 | INHALATION_SPRAY | Freq: Two times a day (BID) | RESPIRATORY_TRACT | Status: DC
  Administered 2023-07-13 – 2023-07-17 (×8): 2 via RESPIRATORY_TRACT
  Filled 2023-07-13 (×3): qty 8.8

## 2023-07-13 MED ORDER — DIATRIZOATE MEGLUMINE & SODIUM 66-10 % PO SOLN: 90.0000 mL | Freq: Once | ORAL | Status: DC

## 2023-07-13 MED ORDER — ALPRAZOLAM 0.25 MG PO TABS
0.2500 mg | ORAL_TABLET | Freq: Two times a day (BID) | ORAL | Status: DC | PRN
  Administered 2023-07-13 – 2023-07-15 (×2): 0.25 mg via ORAL
  Filled 2023-07-13 (×3): qty 1

## 2023-07-13 NOTE — Telephone Encounter (Signed)
 Pt is hospital. PCP is aware.

## 2023-07-13 NOTE — Telephone Encounter (Signed)
 I have called her and left a vm.

## 2023-07-13 NOTE — ED Notes (Signed)
 Patient transported to X-ray

## 2023-07-13 NOTE — Progress Notes (Signed)
 PROGRESS NOTE    [name]  ZOX:[id number] DOB: [birth year] DOA: 07/12/2023 PCP: Sheliah Hatch, MD  Outpatient Specialists:     Brief Narrative:  Patient is a 53 year old female, morbidly obese, with past medical history significant for myelomalacia of cervical cord, sleep apnea, hypertension, hyperlipidemia, fibromyalgia GERD.  Patient is bedbound.  Patient presented with abdominal pain, foul-smelling urine and confusion.  UA suggestive of likely UTI.  Have not visualized urine culture.  Will order urine culture.  Imaging studies reveal possible bowel obstruction.  Patient continues to be frequent.  Patient reports having hard bowel movements.  Potassium of 2.5 on presentation, sodium of 132 and magnesium of 2.  07/13/2023: Patient seen.  Patient reports breaking wind.  Patient also reported bowel movement today.  Abdominal x-ray continues to show colonic dilatation.  No repeat renal panel visualized.  No urine culture visualized.  Assessment & Plan:   Principal Problem:   SBO (small bowel obstruction) (HCC) Active Problems:   Essential hypertension   Compressive cervical cord myelomalacia (HCC)   Hypokalemia   Obesity hypoventilation syndrome (HCC)   Bedbound   Small bowel obstruction: -Patient seems to be improving. -Repeat abdominal x-ray. -Keep magnesium greater than 2 and potassium greater than 4. -Repeat renal panel stat.  -This complicated as patient is bedbound.  Hypokalemia: -Potassium of 2.5 on presentation. -Repeat renal panel. -KCl 40 mEq every 4 x 2 doses. -IV normal saline plus KCl 20 mEq to each liter at the rate of 50 cc/h. -Renal panel in the morning.  Magnesium in the morning.  Possible UTI: -Urine culture. -IV Rocephin.  Morbid obesity: Diet and exercise. -PCP to consider Mounjaro or semaglutide on discharge i.e. if not contraindicated.  Obesity hypoventilation syndrome/[name]: -Patient has not been using CPAP at  home.  Hypertension: -Optimize. -Continue to monitor.   DVT prophylaxis: SCD. Code Status: DO NOT RESUSCITATE. Family Communication:  Disposition Plan: Patient remains inpatient.   Consultants:  Surgery team.  Procedures:  None.  Antimicrobials:  Start IV Rocephin.   Subjective: No new complaints.  Objective: Vitals:   07/13/23 0745 07/13/23 1015 07/13/23 1030 07/13/23 1100  BP: 111/84 101/78 110/80 (!) 142/102  Pulse: 87 92 93 91  Resp: 20 17 (!) 23 (!) 21  Temp:   98.9 F (37.2 C)   TempSrc:   Oral   SpO2: 98% 97% 99% 100%  Weight:      Height:        Intake/Output Summary (Last 24 hours) at 07/13/2023 1345 Last data filed at 07/13/2023 0359 Gross per 24 hour  Intake 99.3 ml  Output 266 ml  Net -166.7 ml   Filed Weights   07/12/23 1640  Weight: (!) 170.1 kg    Examination:  General exam: Appears calm and comfortable.  Patient is morbidly obese. Respiratory system: Clear to auscultation.  Cardiovascular system: S1 & S2 heard Gastrointestinal system: Abdomen is morbidly obese.  Nontender.   Central nervous system: Awake and alert.    Data Reviewed: I have personally reviewed following labs and imaging studies  CBC: Recent Labs  Lab 07/12/23 1702 07/13/23 0555  WBC 8.6 8.0  HGB 13.2 14.1  HCT 41.0 44.1  MCV 89.7 90.7  PLT 302 317   Basic Metabolic Panel: Recent Labs  Lab 07/12/23 1702 07/12/23 2235 07/13/23 0555  NA 132* 134*  --   K 2.5* 2.6*  --   CL 74* 76*  --   CO2 >45* 43*  --   GLUCOSE 93  102*  --   BUN 11 12  --   CREATININE 0.69 0.68  --   CALCIUM 8.4* 8.5*  --   MG  --  2.0 1.9  PHOS  --  2.5 3.4   GFR: Estimated Creatinine Clearance: 124.1 mL/min (by C-G formula based on SCr of 0.68 mg/dL). Liver Function Tests: Recent Labs  Lab 07/12/23 1702  AST 25  ALT 20  ALKPHOS 82  BILITOT 0.8  PROT 6.5  ALBUMIN 2.7*   No results for input(s): "LIPASE", "AMYLASE" in the last 168 hours. No results for input(s):  "AMMONIA" in the last 168 hours. Coagulation Profile: No results for input(s): "INR", "PROTIME" in the last 168 hours. Cardiac Enzymes: No results for input(s): "CKTOTAL", "CKMB", "CKMBINDEX", "TROPONINI" in the last 168 hours. BNP (last 3 results) No results for input(s): "PROBNP" in the last 8760 hours. HbA1C: No results for input(s): "HGBA1C" in the last 72 hours. CBG: No results for input(s): "GLUCAP" in the last 168 hours. Lipid Profile: No results for input(s): "CHOL", "HDL", "LDLCALC", "TRIG", "CHOLHDL", "LDLDIRECT" in the last 72 hours. Thyroid Function Tests: Recent Labs    07/12/23 2300  TSH 1.640   Anemia Panel: No results for input(s): "VITAMINB12", "FOLATE", "FERRITIN", "TIBC", "IRON", "RETICCTPCT" in the last 72 hours. Urine analysis:    Component Value Date/Time   COLORURINE YELLOW 07/13/2023 0201   APPEARANCEUR HAZY (A) 07/13/2023 0201   LABSPEC 1.016 07/13/2023 0201   PHURINE 5.0 07/13/2023 0201   GLUCOSEU NEGATIVE 07/13/2023 0201   HGBUR NEGATIVE 07/13/2023 0201   HGBUR negative 01/05/2009 1414   BILIRUBINUR NEGATIVE 07/13/2023 0201   BILIRUBINUR negative 10/05/2020 1139   KETONESUR NEGATIVE 07/13/2023 0201   PROTEINUR NEGATIVE 07/13/2023 0201   UROBILINOGEN 0.2 10/05/2020 1139   UROBILINOGEN 0.2 01/05/2009 1414   NITRITE POSITIVE (A) 07/13/2023 0201   LEUKOCYTESUR NEGATIVE 07/13/2023 0201   Sepsis Labs: @LABRCNTIP (procalcitonin:4,lacticidven:4)  )No results found for this or any previous visit (from the past 240 hours).       Radiology Studies: CT Renal Stone Study Result Date: 07/12/2023 CLINICAL DATA:  Flank pain. EXAM: CT ABDOMEN AND PELVIS WITHOUT CONTRAST TECHNIQUE: Multidetector CT imaging of the abdomen and pelvis was performed following the standard protocol without IV contrast. RADIATION DOSE REDUCTION: This exam was performed according to the departmental dose-optimization program which includes automated exposure control, adjustment of  the mA and/or kV according to patient size and/or use of iterative reconstruction technique. COMPARISON:  November 09, 2011 FINDINGS: Lower chest: Mild to moderate severity scarring and/or atelectasis is seen within the bilateral lung bases. Hepatobiliary: No focal liver abnormality is seen. Status post cholecystectomy. No biliary dilatation. Pancreas: Unremarkable. No pancreatic ductal dilatation or surrounding inflammatory changes. Spleen: Normal in size without focal abnormality. Adrenals/Urinary Tract: Adrenal glands are unremarkable. Kidneys are normal, without renal calculi, focal lesion, or hydronephrosis. Bladder is unremarkable. Stomach/Bowel: Surgical sutures are seen throughout the gastric region. The appendix is not clearly identified. Numerous dilated small bowel loops are seen throughout the abdomen and pelvis (maximum small bowel diameter of approximately 5.3 cm). A clear transition zone is not identified. Vascular/Lymphatic: No significant vascular findings are present. No enlarged abdominal or pelvic lymph nodes. Reproductive: Status post hysterectomy. No adnexal masses. Other: No abdominal wall hernia or abnormality. No abdominopelvic ascites. Musculoskeletal: A chronic appearing compression fracture deformity is present at the level of T12. Multilevel degenerative changes are seen throughout the lumbar spine. IMPRESSION: 1. Findings consistent with a partial small bowel obstruction versus ileus. 2.  Evidence of prior cholecystectomy, gastric surgery and hysterectomy. 3. Chronic appearing compression fracture deformity at the level of T12. Electronically Signed   By: Aram Candela M.D.   On: 07/12/2023 19:04        Scheduled Meds:  ALPRAZolam  1 mg Oral BID   ARIPiprazole  15 mg Oral Daily   DULoxetine  60 mg Oral BID   ipratropium-albuterol  3 mL Nebulization QID   mometasone-formoterol  2 puff Inhalation BID   Continuous Infusions:   LOS: 1 day    Time spent: 55  minutes.    Berton Mount, MD  [facility] Pager #: [phone] 7PM-7AM contact night coverage as above

## 2023-07-13 NOTE — ED Notes (Signed)
 Soiled linens changed and replaced prior to transport to X-ray.

## 2023-07-13 NOTE — Plan of Care (Signed)

## 2023-07-13 NOTE — Telephone Encounter (Signed)
 Disregard, patient went to ED and has been admitted

## 2023-07-13 NOTE — Consult Note (Signed)
 Reason for Consult/Chief Complaint:  possible SBO Referring Provider: Adela Glimpse, MD  HPI  [name] is an 53 y.o. female with history of morbid obesity, bedbound myelomalacia of her cervical cord, sleep apnea, hyperlipidemia, fibromyalgia, GERD with history of previous gastric bypass, hysterectomy, and cholecystectomy who presented to the emergency department due to concern for UTI.    Patient wears 2 L of oxygen at home at baseline.  Patient states that she has had issues with UTIs in the past causing altered mental status and abdominal pain.  Patient had new abdominal pain that radiated to her back which was new from previous UTIs and this prompted a CT scan to evaluate for nephrolithiasis.  CT scan showed dilation of her small bowel, but no obvious transition point.  This is concerning for possible partial small bowel obstruction versus an ileus.  Labs notable for hyponatremia, hypokalemia, hypochloremia: 134, 2.6, 76 and an elevated bicarb to 56.  Lactic acid was normal.  No leukocytosis.  10 point review of systems is negative except as listed above in HPI.  Objective  Past Medical History: Past Medical History:  Diagnosis Date   Allergic rhinitis    Ankle fracture, right    Anxiety    ASCUS of cervix with negative high risk HPV 05/2018   Carpal tunnel syndrome    Cervical cancer (HCC) 1998   Fibromyalgia    Hyperlipidemia    Hypertension    Migraine headache    Obesity    Sleep apnea     Past Surgical History: Past Surgical History:  Procedure Laterality Date   ABDOMINAL HYSTERECTOMY  10/1996   ABDOMINAL SURGERY  jan/11/2012   gastric bypass surgery   CARPAL TUNNEL RELEASE     bilateral    CESAREAN SECTION     CHOLECYSTECTOMY  07/1992   ESOPHAGOGASTRODUODENOSCOPY N/A 01/14/2016   Procedure: ESOPHAGOGASTRODUODENOSCOPY (EGD);  Surgeon: Napoleon Form, MD;  Location: [facility] ENDOSCOPY;  Service: Endoscopy;  Laterality: N/A;    Family History:  Family History   Problem Relation Age of Onset   Diabetes Mother    Emphysema Mother    Allergies Mother    Hypertension Mother    Heart disease Father    Allergies Father    Prostate cancer Father    Breast cancer Maternal Grandmother 32    Social History:  reports that she has never smoked. She has never used smokeless tobacco. She reports that she does not drink alcohol and does not use drugs.  Allergies:  Allergies  Allergen Reactions   Erythromycin Anaphylaxis   Niacin Anaphylaxis, Shortness Of Breath and Swelling    Swelling and "problems breathing"   Sulfamethoxazole-Trimethoprim Anaphylaxis, Swelling, Rash and Other (See Comments)    Throat closed and the eyes became swollen   Aspirin Hives   Benzoin Compound Rash   Cephalexin Hives    Tolerated ceftriaxone and cefepime 07/2019   Iohexol Hives    Pt treated with PO benedryl   Lisinopril Hives   Naproxen Hives   Pnu-Imune [Pneumococcal Polysaccharide Vaccine] Rash   Sulfonamide Derivatives Rash    Medications: I have reviewed the patient's current medications.  Labs: I have personally reviewed all labs for the past 24h  Imaging: I have personally reviewed and interpreted all imaging for the past 24h and agree with the radiologist's impression.   Physical Exam Blood pressure 131/76, pulse 73, temperature 99.8 F (37.7 C), temperature source Axillary, resp. rate 16, height 5\' 1"  (1.549 m), weight (!) 170.1  kg, SpO2 96%. Constitutional: well-developed, well-nourished, morbidly obese, bed bound HEENT: pupils equal, round, reactive to light, moist conjunctiva, hearing intact Oropharynx: mucous membranes moist CV: Regular rate and rhythm, normotensive Chest: equal chest rise bilaterally normal respiratory effort on [place] Abdomen: soft, does not appear distended beyond baseline obesity, nontender Skin: warm, dry, no rashes Psych: normal memory, normal mood/affect     Assessment   [name] is an 53 y.o. female with concern for  possible pSBO versus ileus.  Plan  - SBO protocol ordered - Okay for patient to try to drink gastrografin PO without NGT placement since passing gas, and not nauseated at this time. - Electrolyte replacement per [facility] - IVF per [facility] - FEN - NPO, ok for a few ice chips for comfort. If develops any nausea then strict NPO - DVT - SCDs,  ok for DVT ppx from general surgery perspective  I reviewed ED provider notes, last 24 h vitals and pain scores, last 48 h intake and output, last 24 h labs and trends, and last 24 h imaging results. I discussed plan of care directly with hospitalist, Dr. Adela Glimpse.  This care required moderate level of medical decision making.    Donata Duff, MD [facility]

## 2023-07-13 NOTE — Telephone Encounter (Signed)
Patient is currently admitted in the ED.

## 2023-07-13 NOTE — ED Provider Notes (Signed)
 [place] EMERGENCY DEPARTMENT AT [facility] Provider Note   CSN: [id number] Arrival date & time: 07/12/23  1631     History  Chief Complaint  Patient presents with   Abdominal Pain   Flank Pain    [name] is a 53 y.o. female.  53 year old female presenting emergency department for generalized lower abdominal pain.  History of gastric bypass surgery.  Last bowel movement was yesterday.  Had some nausea earlier, but none currently.    Abdominal Pain Flank Pain Associated symptoms include abdominal pain.       Home Medications Prior to Admission medications   Medication Sig Start Date End Date Taking? Authorizing Provider  ALPRAZolam Prudy Feeler) 1 MG tablet TAKE 1 TABLET BY MOUTH 2 TIMES DAILY 07/05/23   Sheliah Hatch, MD  ARIPiprazole (ABILIFY) 15 MG tablet TAKE 1 TABLET BY MOUTH EVERY DAY 06/27/23   Sheliah Hatch, MD  Armodafinil 250 MG tablet Take 1 tablet (250 mg total) by mouth in the morning and at bedtime. 08/10/22   Sheliah Hatch, MD  Biotin 10 MG TABS Take 1 tablet (10 mg total) by mouth in the morning. 02/10/23   Sheliah Hatch, MD  buPROPion (WELLBUTRIN XL) 300 MG 24 hr tablet TAKE 1 TABLET BY MOUTH EVERY DAY 05/31/23   Sheliah Hatch, MD  calcium citrate (CALCITRATE - DOSED IN MG ELEMENTAL CALCIUM) 950 (200 Ca) MG tablet Take 1 tablet (200 mg of elemental calcium total) by mouth daily. 02/10/23   Sheliah Hatch, MD  clonazePAM (KLONOPIN) 1 MG tablet Take 1 tablet (1 mg total) by mouth at bedtime. 06/16/23   Sheliah Hatch, MD  cyanocobalamin (VITAMIN B12) 1000 MCG tablet TAKE 1 TABLET BY MOUTH ONCE DAILY 05/31/23   Sheliah Hatch, MD  cyclobenzaprine (FLEXERIL) 10 MG tablet TAKE 2 TABLETS BY MOUTH 2 TIMES DAILY 05/31/23   Sheliah Hatch, MD  diclofenac Sodium (VOLTAREN) 1 % GEL Apply 4 g topically 4 (four) times daily. 08/10/22   Sheliah Hatch, MD  DULoxetine (CYMBALTA) 60 MG capsule Take 2 capsules (120 mg  total) by mouth daily. Patient taking differently: Take 60 mg by mouth 2 (two) times daily. Pt reported taking two capsules(120mg ) once daily 06/22/15   Sheliah Hatch, MD  FEROSUL 325 (65 Fe) MG tablet TAKE 1 TABLET BY MOUTH EVERY MORNING WITH BREAKFAST 05/31/23   Sheliah Hatch, MD  fluticasone-salmeterol (ADVAIR) 250-50 MCG/ACT AEPB Inhale 1 puff into the lungs every 12 (twelve) hours. 06/13/23   Luciano Cutter, MD  folic acid (FOLVITE) 1 MG tablet TAKE 1 TABLET BY MOUTH ONCE DAILY 05/31/23   Sheliah Hatch, MD  furosemide (LASIX) 40 MG tablet Take 1 tablet by mouth daily. May take an additional tablet by mouth IF weight gain of 5 lbs and CALL MD for further instructions. 02/10/23   Sheliah Hatch, MD  gabapentin (NEURONTIN) 600 MG tablet Take 1 tablet (600 mg total) by mouth 2 (two) times daily as needed. Pt takes daily - can take up to twice daily 11/24/22   Noralee Stain, DO  GNP VITAMIN B-1 100 MG tablet TAKE 1 TABLET BY MOUTH EVERY DAY 05/31/23   Sheliah Hatch, MD  HYDROcodone-acetaminophen (NORCO) 10-325 MG tablet TAKE 1 TABLET BY MOUTH EVERY 8 HOURS AS NEEDED 06/28/23   Sheliah Hatch, MD  ipratropium-albuterol (DUONEB) 0.5-2.5 (3) MG/3ML SOLN Take 3 mLs by nebulization every 6 (six) hours as needed (wheezing/shortness of breath).  01/14/23   [place], Eric J, DO  nebivolol (BYSTOLIC) 10 MG tablet TAKE 1 TABLET BY MOUTH EVERY DAY 05/31/23   Sheliah Hatch, MD  nystatin (MYCOSTATIN/NYSTOP) powder APPLY 1 APPLICATION TOPICALLY 3 TIMES DAILY Patient taking differently: Apply 1 Application topically 3 (three) times daily as needed (yeast infection). 08/17/22   Sheliah Hatch, MD  Oxcarbazepine (TRILEPTAL) 300 MG tablet Take 300 mg by mouth 2 (two) times daily.    [provider]  pantoprazole (PROTONIX) 40 MG tablet TAKE 1 TABLET BY MOUTH 2 TIMES DAILY 05/31/23   Sheliah Hatch, MD  phentermine 37.5 MG capsule Take 1 capsule (37.5 mg total) by mouth  every morning. 06/28/23   Sheliah Hatch, MD  potassium chloride (KLOR-CON) 20 MEQ packet Take 1 packet by mouth daily. Take with lasix. 11/24/22 01/23/23  Noralee Stain, DO  promethazine (PHENERGAN) 25 MG tablet TAKE 1 TABLET BY MOUTH EVERY 6 HOURS AS NEEDED FOR NAUSEA AND VOMITING 04/27/23   Sheliah Hatch, MD  promethazine-dextromethorphan (PROMETHAZINE-DM) 6.25-15 MG/5ML syrup Take 5 mLs by mouth 4 (four) times daily as needed. 06/26/23   Sheliah Hatch, MD  pyridOXINE (VITAMIN B6) 100 MG tablet TAKE 1 TABLET BY MOUTH ONCE DAILY 05/31/23   Sheliah Hatch, MD      Allergies    Erythromycin, Niacin, Sulfamethoxazole-trimethoprim, Aspirin, Benzoin compound, Cephalexin, Iohexol, Lisinopril, Naproxen, Pnu-imune [pneumococcal polysaccharide vaccine], and Sulfonamide derivatives    Review of Systems   Review of Systems  Gastrointestinal:  Positive for abdominal pain.  Genitourinary:  Positive for flank pain.    Physical Exam Updated Vital Signs BP 131/76 (BP Location: Right Arm)   Pulse 73   Temp 98.6 F (37 C)   Resp 16   Ht 5\' 1"  (1.549 m)   Wt (!) 170.1 kg   SpO2 96%   BMI 70.86 kg/m  Physical Exam Vitals and nursing note reviewed.  Constitutional:      General: She is not in acute distress.    Appearance: She is obese. She is not toxic-appearing.  HENT:     Head: Normocephalic.  Cardiovascular:     Rate and Rhythm: Normal rate and regular rhythm.  Pulmonary:     Effort: Pulmonary effort is normal.     Breath sounds: Normal breath sounds.  Abdominal:     General: Bowel sounds are normal.     Palpations: Abdomen is soft.     Tenderness: There is generalized abdominal tenderness. There is no guarding or rebound.  Skin:    General: Skin is warm and dry.     Capillary Refill: Capillary refill takes less than 2 seconds.  Neurological:     General: No focal deficit present.     Mental Status: She is alert.  Psychiatric:        Mood and Affect: Mood normal.         Behavior: Behavior normal.     ED Results / Procedures / Treatments   Labs (all labs ordered are listed, but only abnormal results are displayed) Labs Reviewed  COMPREHENSIVE METABOLIC PANEL - Abnormal; Notable for the following components:      Result Value   Sodium 132 (*)    Potassium 2.5 (*)    Chloride 74 (*)    CO2 >45 (*)    Calcium 8.4 (*)    Albumin 2.7 (*)    All other components within normal limits  CBC  URINALYSIS, ROUTINE W REFLEX MICROSCOPIC  BLOOD GAS, VENOUS  BASIC METABOLIC PANEL  MAGNESIUM  PHOSPHORUS  TSH  PREALBUMIN  I-STAT CG4 LACTIC ACID, ED    EKG None  Radiology CT Renal Stone Study Result Date: 07/12/2023 CLINICAL DATA:  Flank pain. EXAM: CT ABDOMEN AND PELVIS WITHOUT CONTRAST TECHNIQUE: Multidetector CT imaging of the abdomen and pelvis was performed following the standard protocol without IV contrast. RADIATION DOSE REDUCTION: This exam was performed according to the departmental dose-optimization program which includes automated exposure control, adjustment of the mA and/or kV according to patient size and/or use of iterative reconstruction technique. COMPARISON:  November 09, 2011 FINDINGS: Lower chest: Mild to moderate severity scarring and/or atelectasis is seen within the bilateral lung bases. Hepatobiliary: No focal liver abnormality is seen. Status post cholecystectomy. No biliary dilatation. Pancreas: Unremarkable. No pancreatic ductal dilatation or surrounding inflammatory changes. Spleen: Normal in size without focal abnormality. Adrenals/Urinary Tract: Adrenal glands are unremarkable. Kidneys are normal, without renal calculi, focal lesion, or hydronephrosis. Bladder is unremarkable. Stomach/Bowel: Surgical sutures are seen throughout the gastric region. The appendix is not clearly identified. Numerous dilated small bowel loops are seen throughout the abdomen and pelvis (maximum small bowel diameter of approximately 5.3 cm). A clear  transition zone is not identified. Vascular/Lymphatic: No significant vascular findings are present. No enlarged abdominal or pelvic lymph nodes. Reproductive: Status post hysterectomy. No adnexal masses. Other: No abdominal wall hernia or abnormality. No abdominopelvic ascites. Musculoskeletal: A chronic appearing compression fracture deformity is present at the level of T12. Multilevel degenerative changes are seen throughout the lumbar spine. IMPRESSION: 1. Findings consistent with a partial small bowel obstruction versus ileus. 2. Evidence of prior cholecystectomy, gastric surgery and hysterectomy. 3. Chronic appearing compression fracture deformity at the level of T12. Electronically Signed   By: Aram Candela M.D.   On: 07/12/2023 19:04    Procedures Procedures    Medications Ordered in ED Medications  potassium chloride 10 mEq in 100 mL IVPB (0 mEq Intravenous Stopped 07/12/23 2211)  HYDROmorphone (DILAUDID) injection 0.5 mg (has no administration in time range)  potassium chloride SA (KLOR-CON M) CR tablet 40 mEq (40 mEq Oral Given 07/12/23 1806)  magnesium oxide (MAG-OX) tablet 400 mg (400 mg Oral Given 07/12/23 1806)    ED Course/ Medical Decision Making/ A&P Clinical Course as of 07/13/23 0009  Wed Jul 12, 2023  2137 Complianing of worsening abd pain. Will give pain medications. CT reviewed. Has what appears to be SBO. Given low potassium and CT scans with patient's comorbid past medical history will admit for further management of SBO and hypokalemia. [TY]    Clinical Course User Index [TY] Coral Spikes, DO                                 Medical Decision Making This is a 53 year old female with morbid obesity presenting emergency department for generalized abdominal pain.  Does have significant carbon medical conditions to include obesity, prior myalgia, hypertension hyperlipidemia with prior cholecystectomy hysterectomy and gastric bypass.  She is afebrile nontachycardic  hemodynamically stable.  Does have some mild generalized abdominal pain more focal in lower abdomen.  Does have history of hypokalemia.  Worse today.  Repleted.  No leukocytosis.  CT scan with concerning findings for possible SBO.  Given Dilaudid for pain control.  Discussed case with surgery, will see in consult.  Admitted to medicine.  Spoke with hospitalist who will admit patient.  Amount and/or Complexity of Data Reviewed  Labs: ordered. Radiology: ordered. ECG/medicine tests: ordered.  Risk OTC drugs. Prescription drug management. Decision regarding hospitalization.         Final Clinical Impression(s) / ED Diagnoses Final diagnoses:  SBO (small bowel obstruction) (HCC)  Hypokalemia    Rx / DC Orders ED Discharge Orders     None         Coral Spikes, DO 07/13/23 0009

## 2023-07-14 ENCOUNTER — Inpatient Hospital Stay (HOSPITAL_COMMUNITY): Payer: 59

## 2023-07-14 DIAGNOSIS — K56609 Unspecified intestinal obstruction, unspecified as to partial versus complete obstruction: Secondary | ICD-10-CM | POA: Diagnosis not present

## 2023-07-14 LAB — BASIC METABOLIC PANEL
Anion gap: 13 (ref 5–15)
BUN: 10 mg/dL (ref 6–20)
CO2: 38 mmol/L — ABNORMAL HIGH (ref 22–32)
Calcium: 7.8 mg/dL — ABNORMAL LOW (ref 8.9–10.3)
Chloride: 85 mmol/L — ABNORMAL LOW (ref 98–111)
Creatinine, Ser: 0.54 mg/dL (ref 0.44–1.00)
GFR, Estimated: >60 mL/min (ref >60)
Glucose, Bld: 73 mg/dL (ref 70–99)
Potassium: 3.2 mmol/L — ABNORMAL LOW (ref 3.5–5.1)
Sodium: 136 mmol/L (ref 135–145)

## 2023-07-14 LAB — CBC WITH DIFFERENTIAL/PLATELET
Abs Immature Granulocytes: 0.02 10*3/uL (ref 0.00–0.07)
Basophils Absolute: 0 10*3/uL (ref 0.0–0.1)
Basophils Relative: 0 %
Eosinophils Absolute: 0.3 10*3/uL (ref 0.0–0.5)
Eosinophils Relative: 4 %
HCT: 36.5 % (ref 36.0–46.0)
Hemoglobin: 11.5 g/dL — ABNORMAL LOW (ref 12.0–15.0)
Immature Granulocytes: 0 %
Lymphocytes Relative: 24 %
Lymphs Abs: 1.4 10*3/uL (ref 0.7–4.0)
MCH: 29.2 pg (ref 26.0–34.0)
MCHC: 31.5 g/dL (ref 30.0–36.0)
MCV: 92.6 fL (ref 80.0–100.0)
Monocytes Absolute: 0.7 10*3/uL (ref 0.1–1.0)
Monocytes Relative: 11 %
Neutro Abs: 3.6 10*3/uL (ref 1.7–7.7)
Neutrophils Relative %: 61 %
Platelets: 254 10*3/uL (ref 150–400)
RBC: 3.94 MIL/uL (ref 3.87–5.11)
RDW: 13.2 % (ref 11.5–15.5)
WBC: 6.1 10*3/uL (ref 4.0–10.5)
nRBC: 0 % (ref 0.0–0.2)

## 2023-07-14 LAB — MAGNESIUM: Magnesium: 2.6 mg/dL — ABNORMAL HIGH (ref 1.7–2.4)

## 2023-07-14 LAB — BRAIN NATRIURETIC PEPTIDE: B Natriuretic Peptide: 35.8 pg/mL (ref 0.0–100.0)

## 2023-07-14 LAB — TSH: TSH: 2.679 u[IU]/mL (ref 0.350–4.500)

## 2023-07-14 LAB — PHOSPHORUS: Phosphorus: 2 mg/dL — ABNORMAL LOW (ref 2.5–4.6)

## 2023-07-14 LAB — T4, FREE: Free T4: 1.09 ng/dL (ref 0.61–1.12)

## 2023-07-14 MED ORDER — CALCIUM CARBONATE ANTACID 500 MG PO CHEW
200.0000 mg | CHEWABLE_TABLET | Freq: Three times a day (TID) | ORAL | Status: DC
  Administered 2023-07-14 – 2023-07-17 (×8): 200 mg via ORAL
  Filled 2023-07-14 (×9): qty 1

## 2023-07-14 MED ORDER — LACTATED RINGERS IV SOLN: INTRAVENOUS | Status: AC

## 2023-07-14 MED ORDER — HEPARIN SODIUM (PORCINE) 5000 UNIT/ML IJ SOLN
5000.0000 [IU] | Freq: Three times a day (TID) | INTRAMUSCULAR | Status: DC
  Administered 2023-07-14 – 2023-07-17 (×9): 5000 [IU] via SUBCUTANEOUS
  Filled 2023-07-14 (×10): qty 1

## 2023-07-14 MED ORDER — OXCARBAZEPINE 300 MG PO TABS
300.0000 mg | ORAL_TABLET | Freq: Two times a day (BID) | ORAL | Status: DC
  Administered 2023-07-14 – 2023-07-17 (×6): 300 mg via ORAL
  Filled 2023-07-14 (×8): qty 1

## 2023-07-14 MED ORDER — VITAMIN B-12 1000 MCG PO TABS
1000.0000 ug | ORAL_TABLET | Freq: Every day | ORAL | Status: DC
  Administered 2023-07-14 – 2023-07-17 (×4): 1000 ug via ORAL
  Filled 2023-07-14 (×4): qty 1

## 2023-07-14 MED ORDER — SODIUM PHOSPHATES 45 MMOLE/15ML IV SOLN
30.0000 mmol | Freq: Once | INTRAVENOUS | Status: AC
  Administered 2023-07-14: 30 mmol via INTRAVENOUS
  Filled 2023-07-14: qty 10

## 2023-07-14 MED ORDER — ARMODAFINIL 250 MG PO TABS: 250.0000 mg | ORAL_TABLET | Freq: Every day | ORAL | Status: DC

## 2023-07-14 MED ORDER — MODAFINIL 100 MG PO TABS
200.0000 mg | ORAL_TABLET | Freq: Two times a day (BID) | ORAL | Status: DC
  Administered 2023-07-14 – 2023-07-17 (×7): 200 mg via ORAL
  Filled 2023-07-14 (×7): qty 2

## 2023-07-14 MED ORDER — BOOST / RESOURCE BREEZE PO LIQD CUSTOM
1.0000 | Freq: Three times a day (TID) | ORAL | Status: DC
  Administered 2023-07-14 – 2023-07-15 (×3): 1 via ORAL

## 2023-07-14 MED ORDER — TRAMADOL HCL 50 MG PO TABS
50.0000 mg | ORAL_TABLET | Freq: Two times a day (BID) | ORAL | Status: DC | PRN
  Administered 2023-07-14 – 2023-07-17 (×5): 50 mg via ORAL
  Filled 2023-07-14 (×5): qty 1

## 2023-07-14 MED ORDER — LACTATED RINGERS IV BOLUS
1000.0000 mL | Freq: Once | INTRAVENOUS | Status: AC
  Administered 2023-07-14: 1000 mL via INTRAVENOUS

## 2023-07-14 MED ORDER — FOLIC ACID 1 MG PO TABS
1.0000 mg | ORAL_TABLET | Freq: Every day | ORAL | Status: DC
Start: 2023-07-14 — End: 2023-07-17
  Administered 2023-07-14 – 2023-07-17 (×4): 1 mg via ORAL
  Filled 2023-07-14 (×4): qty 1

## 2023-07-14 MED ORDER — VITAMIN B-6 100 MG PO TABS
100.0000 mg | ORAL_TABLET | Freq: Every day | ORAL | Status: DC
  Administered 2023-07-14 – 2023-07-17 (×4): 100 mg via ORAL
  Filled 2023-07-14 (×4): qty 1

## 2023-07-14 MED ORDER — METOPROLOL TARTRATE 25 MG PO TABS
25.0000 mg | ORAL_TABLET | Freq: Two times a day (BID) | ORAL | Status: DC
  Administered 2023-07-14 – 2023-07-17 (×6): 25 mg via ORAL
  Filled 2023-07-14 (×7): qty 1

## 2023-07-14 MED ORDER — MAGNESIUM SULFATE 2 GM/50ML IV SOLN
2.0000 g | Freq: Once | INTRAVENOUS | Status: AC
  Administered 2023-07-14: 2 g via INTRAVENOUS
  Filled 2023-07-14: qty 50

## 2023-07-14 MED ORDER — PANTOPRAZOLE SODIUM 40 MG PO TBEC
40.0000 mg | DELAYED_RELEASE_TABLET | Freq: Two times a day (BID) | ORAL | Status: DC
  Administered 2023-07-14 – 2023-07-17 (×7): 40 mg via ORAL
  Filled 2023-07-14 (×7): qty 1

## 2023-07-14 MED ORDER — POTASSIUM CHLORIDE 10 MEQ/100ML IV SOLN
10.0000 meq | INTRAVENOUS | Status: AC
  Administered 2023-07-14 (×4): 10 meq via INTRAVENOUS
  Filled 2023-07-14 (×4): qty 100

## 2023-07-14 MED ORDER — POTASSIUM CHLORIDE 20 MEQ PO PACK
40.0000 meq | PACK | Freq: Once | ORAL | Status: DC
  Filled 2023-07-14: qty 2

## 2023-07-14 NOTE — Progress Notes (Addendum)
 Initial Nutrition Assessment  DOCUMENTATION CODES:   Morbid obesity  INTERVENTION:  Boost Breeze po TID, each supplement provides 250 kcal and 9 grams of protein  Advance diet per MD   Recommend bariatric vitamin regimen given history of roux en y  MVI BID, however will hold due to noted niacin allergy until able to confirm with pt Tums TID    NUTRITION DIAGNOSIS:   Inadequate oral intake related to acute illness, altered GI function (SBO) as evidenced by other (comment) (clear liquid diet).  GOAL:   Patient will meet greater than or equal to 90% of their needs  MONITOR:   Diet advancement, Labs, Weight trends, PO intake, Supplement acceptance  REASON FOR ASSESSMENT:   Consult Assessment of nutrition requirement/status  ASSESSMENT:   Pt admitted for small bowel obstruction and hypokalemia. Pt with PMH of morbid obesity, bedbound myelomalacia of cervical cord, sleep apnea, HTN, GERD, HLD, fibromyalgia, roux en y surgery 2014.   2/28: bowel function seems to be returning per MD note  Pt laying in bed with multiple nurses present providing pt care. Pt reports eating 6 small meals throughout the day at home. Pt reports eating meat and cheese snacks throughout the day. She reports being hungry today and is ready to have some clear liquids. Pt reports wanting some jello and beef broth.   Admit weight: 375 lbs Pt reports stable weight however has not weighed herself recently. Per chart review pt weighed 264 lbs back in July of 2024. Question accuracy of admission weight given documentation appears to be pulled forward from prior weight.  Meds: potassium chloride, folic acid, B12, protonix  Labs: potassium low, chloride 81  NUTRITION - FOCUSED PHYSICAL EXAM:  Unable to perform NFPE given that multiple nurses were with pt providing care. Expect body habitus to mask any muscle deficits.    Diet Order:   Diet Order             Diet clear liquid Room service appropriate?  No; Fluid consistency: Thin  Diet effective now                   EDUCATION NEEDS:   Not appropriate for education at this time  Skin:  Skin Assessment: Reviewed RN Assessment  Last BM:  2/27-type 7  Height:   Ht Readings from Last 1 Encounters:  07/12/23 5\' 1"  (1.549 m)    Weight:   Wt Readings from Last 1 Encounters:  07/12/23 (!) 170.1 kg    Ideal Body Weight:  47.7 kg  BMI:  Body mass index is 70.86 kg/m.  Estimated Nutritional Needs:   Kcal:  1500-1700  Protein:  75-90 g  Fluid:  > 1.5 L   Maceo Pro, MS Dietetic Intern

## 2023-07-14 NOTE — Progress Notes (Signed)
 PROGRESS NOTE                                                                                                                                                                                                             Patient Demographics:    [name], is a 53 y.o. female, DOB - 1970/09/04, RUE:[id number]  Outpatient Primary MD for the patient is Sheliah Hatch, MD    LOS - 2  Admit date - 07/12/2023    Chief Complaint  Patient presents with   Abdominal Pain   Flank Pain       Brief Narrative (HPI from H&P)   53 year old female, morbidly obese, with past medical history significant for myelomalacia of cervical cord, sleep apnea, hypertension, hyperlipidemia, fibromyalgia GERD.  Patient is bedbound.  Patient presented with abdominal pain, foul-smelling urine and confusion.  UA suggestive of likely UTI.  Have not visualized urine culture.  Will order urine culture.  CT of the abdomen pelvis revealed SBO, general surgery was consulted and she was admitted to the hospital.   Subjective:    [name] has, No headache, No chest pain, No abdominal pain - No Nausea, No new weakness tingling or numbness, no SOB   Assessment  & Plan :     Small bowel obstruction: -Clinically improved General Surgery following, follow-up imaging, bowel function seems to be returning.  Defer management of this problem to general surgery.   Hypokalemia: replace and monitor   Possible UTI: -Follow urine culture. -IV Rocephin.   Morbid obesity hypoventilation syndrome/[name]: -Patient has not been using CPAP at home.  Follow-up with PCP for weight loss and outpatient sleep study if needed.   Hypertension: As needed hydralazine.   Asymptomatic run of SVT night of 07/13/2023.  Self resolved, placed on low-dose beta-blocker, stable TSH and free T4.  Her on telemetry.  Send echo looks stable with a EF of 60%.  Could be [name] related.        Condition - Fair  Family Communication  :   [name] [phone]  on 07/14/2023  Code Status :   DNR  Consults  :  CCS  PUD Prophylaxis :    Procedures  :      CT - SBO      Disposition Plan  :    Status is:  Inpatient   DVT Prophylaxis  :  Heparin added 07/14/23  SCDs Start: 07/13/23 0135    Lab Results  Component Value Date   PLT 254 07/14/2023    Diet :  Diet Order             Diet NPO time specified Except for: Ice Chips, Sips with Meds  Diet effective now                    Inpatient Medications  Scheduled Meds:  ALPRAZolam  1 mg Oral BID   ARIPiprazole  15 mg Oral Daily   Armodafinil  250 mg Oral Q2000   cyanocobalamin  1,000 mcg Oral Daily   DULoxetine  60 mg Oral BID   folic acid  1 mg Oral Daily   heparin injection (subcutaneous)  5,000 Units Subcutaneous Q8H   ipratropium-albuterol  3 mL Nebulization QID   metoprolol tartrate  25 mg Oral BID   mometasone-formoterol  2 puff Inhalation BID   Oxcarbazepine  300 mg Oral BID   pantoprazole  40 mg Oral BID   potassium chloride  40 mEq Oral Once   pyridOXINE  100 mg Oral Daily   Continuous Infusions:  cefTRIAXone (ROCEPHIN)  IV 1 g (07/13/23 2043)   lactated ringers 75 mL/hr at 07/14/23 0540   sodium PHOSPHATE IVPB (in mmol)     PRN Meds:.acetaminophen **OR** acetaminophen, albuterol, ALPRAZolam, fentaNYL (SUBLIMAZE) injection, ondansetron **OR** ondansetron (ZOFRAN) IV  Antibiotics  :    Anti-infectives (From admission, onward)    Start     Dose/Rate Route Frequency Ordered Stop   07/13/23 1900  cefTRIAXone (ROCEPHIN) 1 g in sodium chloride 0.9 % 100 mL IVPB        1 g 200 mL/hr over 30 Minutes Intravenous Every 24 hours 07/13/23 1800           Objective:   Vitals:   07/14/23 0455 07/14/23 0500 07/14/23 0536 07/14/23 0800  BP:  (!) 101/58 108/60 (!) 99/52  Pulse: 82 81 79 83  Resp: (!) 23 18  17   Temp:  98.8 F (37.1 C)  98.6 F (37 C)  TempSrc:  Axillary   Axillary  SpO2: 99% 92%  93%  Weight:      Height:        Wt Readings from Last 3 Encounters:  07/12/23 (!) 170.1 kg  01/20/23 (!) 170.1 kg  12/14/22 120.2 kg    No intake or output data in the 24 hours ending 07/14/23 0911   Physical Exam  Awake Alert, No new F.N deficits, Normal affect [place].AT,PERRAL Supple Neck, No JVD,   Symmetrical Chest wall movement, Good air movement bilaterally, CTAB RRR,No Gallops,Rubs or new Murmurs,  +ve B.Sounds, Abd Soft, No tenderness,   No Cyanosis, Clubbing or edema     RN pressure injury documentation: Pressure Injury 11/21/22 Buttocks Right Stage 2 -  Partial thickness loss of dermis presenting as a shallow open injury with a red, pink wound bed without slough. (Active)  11/21/22 1849  Location: Buttocks  Location Orientation: Right  Staging: Stage 2 -  Partial thickness loss of dermis presenting as a shallow open injury with a red, pink wound bed without slough.  Wound Description (Comments):   Present on Admission: Yes     Pressure Injury 11/21/22 Buttocks Left (Active)  11/21/22 2150  Location: Buttocks  Location Orientation: Left  Staging:   Wound Description (Comments):   Present on Admission: Yes  Data Review:    Recent Labs  Lab 07/12/23 1702 07/13/23 0555 07/14/23 0533  WBC 8.6 8.0 6.1  HGB 13.2 14.1 11.5*  HCT 41.0 44.1 36.5  PLT 302 317 254  MCV 89.7 90.7 92.6  MCH 28.9 29.0 29.2  MCHC 32.2 32.0 31.5  RDW 12.7 12.9 13.2  LYMPHSABS  --   --  1.4  MONOABS  --   --  0.7  EOSABS  --   --  0.3  BASOSABS  --   --  0.0    Recent Labs  Lab 07/12/23 1702 07/12/23 2235 07/12/23 2300 07/13/23 0154 07/13/23 0555 07/13/23 1838 07/14/23 0533  NA 132* 134*  --   --   --  136  --   K 2.5* 2.6*  --   --   --  2.9*  --   CL 74* 76*  --   --   --  81*  --   CO2 >45* 43*  --   --   --  42*  --   ANIONGAP NOT CALCULATED 15  --   --   --  13  --   GLUCOSE 93 102*  --   --   --  87  --   BUN 11 12  --   --   --   14  --   CREATININE 0.69 0.68  --   --   --  0.77  --   AST 25  --   --   --   --   --   --   ALT 20  --   --   --   --   --   --   ALKPHOS 82  --   --   --   --   --   --   BILITOT 0.8  --   --   --   --   --   --   ALBUMIN 2.7*  --   --   --   --  2.6*  --   LATICACIDVEN  --   --   --  0.7  --   --   --   TSH  --   --  1.640  --   --   --  2.679  BNP  --   --   --   --   --   --  35.8  MG  --  2.0  --   --  1.9  --  2.6*  PHOS  --  2.5  --   --  3.4 3.4 2.0*  CALCIUM 8.4* 8.5*  --   --   --  8.2*  --       Recent Labs  Lab 07/12/23 1702 07/12/23 2235 07/12/23 2300 07/13/23 0154 07/13/23 0555 07/13/23 1838 07/14/23 0533  LATICACIDVEN  --   --   --  0.7  --   --   --   TSH  --   --  1.640  --   --   --  2.679  BNP  --   --   --   --   --   --  35.8  MG  --  2.0  --   --  1.9  --  2.6*  CALCIUM 8.4* 8.5*  --   --   --  8.2*  --     --------------------------------------------------------------------------------------------------------------- Lab Results  Component Value Date   CHOL 171 05/06/2022   HDL 49 (  L) 05/06/2022   LDLCALC 98 05/06/2022   LDLDIRECT 76.0 11/24/2015   TRIG 142 05/06/2022   CHOLHDL 3.5 05/06/2022    Lab Results  Component Value Date   HGBA1C 5.3 05/06/2022   Recent Labs    07/14/23 0533  TSH 2.679  FREET4 1.09   No results for input(s): "VITAMINB12", "FOLATE", "FERRITIN", "TIBC", "IRON", "RETICCTPCT" in the last 72 hours. ------------------------------------------------------------------------------------------------------------------ Cardiac Enzymes No results for input(s): "CKMB", "TROPONINI", "MYOGLOBIN" in the last 168 hours.  Invalid input(s): "CK"  Micro Results No results found for this or any previous visit (from the past 240 hours).  Radiology Report DG Abdomen 1 View Result Date: 07/13/2023 CLINICAL DATA:  Abdominal pain. EXAM: ABDOMEN - 1 VIEW COMPARISON:  Abdominal radiographs dated 07/13/2023 at 11:46 a.m. FINDINGS:  Similar gaseous dilatation of small-bowel loops measuring up to 6.5 cm in diameter, concerning for small bowel obstruction. No definite evidence of free air. Visualized osseous structures are unchanged. IMPRESSION: Similar gaseous dilatation of small-bowel loops measuring up to 6.5 cm in diameter, concerning for small bowel obstruction. Electronically Signed   By: Hart Robinsons M.D.   On: 07/13/2023 19:07   DG Abd Portable 1V-Small Bowel Obstruction Protocol-initial, 8 hr delay Result Date: 07/13/2023 CLINICAL DATA:  Small-bowel obstruction. EXAM: PORTABLE ABDOMEN - 1 VIEW COMPARISON:  CT scan abdomen and pelvis from 07/12/2023. FINDINGS: There is gaseous dilatation of small bowel loops (diameter up to 6.3 cm), which is disproportionate to the degree of distention of the colon, favoring small bowel obstruction. No evidence of pneumoperitoneum, within the limitations of a supine film. No acute osseous abnormalities. The soft tissues are within normal limits. Surgical changes, devices, tubes and lines: None. IMPRESSION: *Gaseous dilatation of small bowel loops (diameter up to 6.3 cm), which is disproportionate to the degree of distention of the colon, favoring small bowel obstruction. Electronically Signed   By: Jules Schick M.D.   On: 07/13/2023 14:17   CT Renal Stone Study Result Date: 07/12/2023 CLINICAL DATA:  Flank pain. EXAM: CT ABDOMEN AND PELVIS WITHOUT CONTRAST TECHNIQUE: Multidetector CT imaging of the abdomen and pelvis was performed following the standard protocol without IV contrast. RADIATION DOSE REDUCTION: This exam was performed according to the departmental dose-optimization program which includes automated exposure control, adjustment of the mA and/or kV according to patient size and/or use of iterative reconstruction technique. COMPARISON:  November 09, 2011 FINDINGS: Lower chest: Mild to moderate severity scarring and/or atelectasis is seen within the bilateral lung bases. Hepatobiliary: No  focal liver abnormality is seen. Status post cholecystectomy. No biliary dilatation. Pancreas: Unremarkable. No pancreatic ductal dilatation or surrounding inflammatory changes. Spleen: Normal in size without focal abnormality. Adrenals/Urinary Tract: Adrenal glands are unremarkable. Kidneys are normal, without renal calculi, focal lesion, or hydronephrosis. Bladder is unremarkable. Stomach/Bowel: Surgical sutures are seen throughout the gastric region. The appendix is not clearly identified. Numerous dilated small bowel loops are seen throughout the abdomen and pelvis (maximum small bowel diameter of approximately 5.3 cm). A clear transition zone is not identified. Vascular/Lymphatic: No significant vascular findings are present. No enlarged abdominal or pelvic lymph nodes. Reproductive: Status post hysterectomy. No adnexal masses. Other: No abdominal wall hernia or abnormality. No abdominopelvic ascites. Musculoskeletal: A chronic appearing compression fracture deformity is present at the level of T12. Multilevel degenerative changes are seen throughout the lumbar spine. IMPRESSION: 1. Findings consistent with a partial small bowel obstruction versus ileus. 2. Evidence of prior cholecystectomy, gastric surgery and hysterectomy. 3. Chronic appearing compression fracture deformity at  the level of T12. Electronically Signed   By: Aram Candela M.D.   On: 07/12/2023 19:04     Signature  -   Susa Raring M.D on 07/14/2023 at 9:11 AM   -  To page go to [email or web address]

## 2023-07-14 NOTE — TOC Initial Note (Signed)
 Transition of Care [facility]) - Initial/Assessment Note    Patient Details  Name: [name] MRN: [id number] Date of Birth: 05-28-1970  [facility] CM/SW Contact:    Jessie Foot, RN Phone Number: 07/14/2023, 1:10 PM  Clinical Narrative:                 Risk assessment completed. Presented for abdominal and flank pain from home via EMS. Patient is bed bound and active with [facility] for HH (PT/OT/RN, on oxygen 2 lites and CPAP. DME cane, walker, bedside commode, shower chair, wheelchair, hoyer lift, & hospital bed. Pt will need Ambulance transport home,& 02. Case Manager will follow for progression to discharge.     Expected Discharge Plan: Home w Home Health Services Barriers to Discharge: Transportation, Continued Medical Work up   Patient Goals and CMS Choice Patient states their goals for this hospitalization and ongoing recovery are:: Return home          Expected Discharge Plan and Services   Discharge Planning Services: CM Consult   Living arrangements for the past 2 months: Single Family Home                                      Prior Living Arrangements/Services Living arrangements for the past 2 months: Single Family Home Lives with:: Adult Children Patient language and need for interpreter reviewed:: Yes Do you feel safe going back to the place where you live?: Yes      Need for Family Participation in Patient Care: No (Comment) Care giver support system in place?: Yes (comment) Current home services: DME Criminal Activity/Legal Involvement Pertinent to Current Situation/Hospitalization: No - Comment as needed  Activities of Daily Living   ADL Screening (condition at time of admission) Independently performs ADLs?: Yes (appropriate for developmental age) Is the patient deaf or have difficulty hearing?: No Does the patient have difficulty seeing, even when wearing glasses/contacts?: No Does the patient have difficulty concentrating,  remembering, or making decisions?: No  Permission Sought/Granted Permission sought to share information with : Case Manager Permission granted to share information with : No        Permission granted to share info w Relationship: Jennye Boroughs (son)  Permission granted to share info w Contact Information: [phone]  Emotional Assessment Appearance:: Appears stated age Attitude/Demeanor/Rapport: Engaged Affect (typically observed): Accepting, Appropriate Orientation: : Oriented to Self, Oriented to Place, Oriented to  Time, Oriented to Situation Alcohol / Substance Use: Not Applicable Psych Involvement: No (comment)  Admission diagnosis:  Hypokalemia [E87.6] Small bowel obstruction (HCC) [K56.609] SBO (small bowel obstruction) (HCC) [K56.609] Patient Active Problem List   Diagnosis Date Noted   SBO (small bowel obstruction) (HCC) 07/12/2023   Asthma, chronic, unspecified asthma severity, with acute exacerbation 01/12/2023   Hypomagnesemia 01/12/2023   Bedbound 12/14/2022   Acute on chronic heart failure with preserved ejection fraction (HFpEF) (HCC) 11/23/2022   Pressure injury of skin 11/23/2022   Nocturnal hypoxemia 06/21/2022   Elevated LFTs 05/06/2022   Obesity hypoventilation syndrome (HCC) 05/06/2022   Acute respiratory failure with hypoxia (HCC) 04/22/2022   Hypokalemia 04/22/2022   Leg cramps 01/12/2022   Fever of unknown origin 08/12/2021   Sacral pressure sore 07/06/2021   Vitamin B6 deficiency 06/08/2021   Myelomalacia of cervical cord (HCC) 06/06/2021   Compressive cervical cord myelomalacia (HCC) 06/05/2021   B12 deficiency 06/05/2021   Folate deficiency 06/05/2021  Vitamin D deficiency 06/05/2021   Leg weakness, bilateral 06/04/2021   Cervical atypia 01/06/2021   Steatosis of liver 01/06/2021   Hyponatremia 07/27/2019   Urinary urgency 04/04/2018   Chronic narcotic use 09/07/2016   Acute blood loss anemia 01/14/2016   Multiple gastric ulcers     PAN (polyarteritis nodosa) (HCC) 11/24/2015   GERD (gastroesophageal reflux disease) 01/08/2014   Routine general medical examination at a health care facility 04/23/2013   Bariatric surgery status 10/31/2012   S/P skin biopsy 10/31/2012   Psoriasis 01/09/2012   DDD (degenerative disc disease), lumbar 11/30/2011   Migraine headache    [name] (obstructive sleep apnea) 11/16/2010   HYPERGLYCEMIA, FASTING 10/08/2009   CONTRACTURE OF TENDON 08/17/2009   PARESTHESIA, HANDS 12/23/2008   Leg pain, right 11/05/2008   ADVERSE DRUG REACTION 04/03/2008   PERIPHERAL EDEMA 03/26/2008   Obesity, morbid, BMI 50 or higher (HCC) 01/03/2007   Cellulitis 01/03/2007   Hyperlipidemia 09/22/2006   Fibromyalgia 09/20/2006   Anxiety and depression 06/28/2006   Essential hypertension 06/28/2006   PCP:  Sheliah Hatch, MD Pharmacy:   [facility] [place], [place] - [address] [place] [address] Phone: [phone] Fax: [phone]  [facility] [address] [place] [address] Phone: [phone] Fax: [phone]     Social Drivers of Health (SDOH) Social History: SDOH Screenings   Food Insecurity: No Food Insecurity (07/13/2023)  Housing: Low Risk  (07/13/2023)  Transportation Needs: No Transportation Needs (07/13/2023)  Utilities: Not At Risk (07/13/2023)  Alcohol Screen: Low Risk  (11/02/2022)  Depression (PHQ2-9): Medium Risk (06/26/2023)  Financial Resource Strain: Low Risk  (11/02/2022)  Physical Activity: Inactive (11/02/2022)  Social Connections: Unknown (11/02/2022)  Recent Concern: Social Connections - Socially Isolated (11/02/2022)  Stress: Stress Concern Present (11/02/2022)  Tobacco Use: Low Risk  (07/12/2023)   SDOH Interventions:     Readmission Risk Interventions    07/14/2023   12:56 PM 04/24/2022   11:10 AM  Readmission Risk Prevention Plan  Transportation Screening Complete Complete  HRI or Home Care  Consult  Complete  Social Work Consult for Recovery Care Planning/Counseling  Complete  Palliative Care Screening  Not Applicable  Medication Review Oceanographer) Referral to Pharmacy Referral to Pharmacy  [facility] or Home Care Consult Complete   Palliative Care Screening Not Applicable

## 2023-07-14 NOTE — Progress Notes (Signed)
 Progress Note     Subjective: Pt reports large loose BMs overnight. She denies abdominal pain, nausea or vomiting. She wants some ice water.   Objective: Vital signs in last 24 hours: Temp:  [97.6 F (36.4 C)-98.9 F (37.2 C)] 98.6 F (37 C) (02/28 0800) Pulse Rate:  [78-93] 82 (02/28 0917) Resp:  [17-24] 24 (02/28 0917) BP: (99-142)/(52-102) 99/52 (02/28 0917) SpO2:  [91 %-100 %] 99 % (02/28 0917) Last BM Date : 07/14/23  Intake/Output from previous day: No intake/output data recorded. Intake/Output this shift: No intake/output data recorded.  PE: General: pleasant, WD, obese female who is laying in bed in NAD Abd: soft, NT, difficult to assess distention, no peritonitis   Lab Results:  Recent Labs    07/13/23 0555 07/14/23 0533  WBC 8.0 6.1  HGB 14.1 11.5*  HCT 44.1 36.5  PLT 317 254   BMET Recent Labs    07/13/23 1838 07/14/23 0757  NA 136 136  K 2.9* 3.2*  CL 81* 85*  CO2 42* 38*  GLUCOSE 87 73  BUN 14 10  CREATININE 0.77 0.54  CALCIUM 8.2* 7.8*   PT/INR No results for input(s): "LABPROT", "INR" in the last 72 hours. CMP     Component Value Date/Time   NA 136 07/14/2023 0757   NA 127 (A) 04/15/2019 0000   K 3.2 (L) 07/14/2023 0757   CL 85 (L) 07/14/2023 0757   CO2 38 (H) 07/14/2023 0757   GLUCOSE 73 07/14/2023 0757   BUN 10 07/14/2023 0757   BUN 7 04/15/2019 0000   CREATININE 0.54 07/14/2023 0757   CREATININE 0.56 05/06/2022 1150   CALCIUM 7.8 (L) 07/14/2023 0757   PROT 6.5 07/12/2023 1702   ALBUMIN 2.6 (L) 07/13/2023 1838   AST 25 07/12/2023 1702   ALT 20 07/12/2023 1702   ALKPHOS 82 07/12/2023 1702   BILITOT 0.8 07/12/2023 1702   GFRNONAA >60 07/14/2023 0757   GFRNONAA 83 02/27/2013 1519   GFRAA >60 08/02/2019 0209   GFRAA >89 02/27/2013 1519   Lipase     Component Value Date/Time   LIPASE 19.0 08/13/2008 1212       Studies/Results: DG Abd 1 View Result Date: 07/14/2023 CLINICAL DATA:  Small bowel obstruction EXAM:  ABDOMEN - 1 VIEW COMPARISON:  Abdominal radiographs 07/13/2023 FINDINGS: A series of multiple radiographs were obtained to encompass the entirety of the abdomen. There is persistent gaseous distension of several loops of small bowel in the central abdomen, however this appears improved compared to yesterday. Additionally, there is increased gas within the colon suggesting that the obstruction is resolving. No large free air. IMPRESSION: Improving bowel gas pattern with decreased distension of loops of small bowel and increasing gas within the colon. Electronically Signed   By: Malachy Moan M.D.   On: 07/14/2023 09:16   DG Chest Port 1 View Result Date: 07/14/2023 CLINICAL DATA:  Short of breath EXAM: PORTABLE CHEST 1 VIEW COMPARISON:  Prior chest x-ray 01/12/2023 FINDINGS: Stable cardiac and mediastinal contours. Increased pulmonary vascular congestion bordering on mild edema. Chronic elevation of the right hemidiaphragm with associated right lower lobe and right middle lobe atelectasis. No large effusion or pneumothorax. IMPRESSION: 1. Increased pulmonary vascular congestion now with mild interstitial edema. 2. Chronic elevation of the right hemidiaphragm with associated atelectasis in the right middle and right lower lobes. Electronically Signed   By: Malachy Moan M.D.   On: 07/14/2023 09:14   DG Abdomen 1 View Result Date: 07/13/2023 CLINICAL DATA:  Abdominal pain. EXAM: ABDOMEN - 1 VIEW COMPARISON:  Abdominal radiographs dated 07/13/2023 at 11:46 a.m. FINDINGS: Similar gaseous dilatation of small-bowel loops measuring up to 6.5 cm in diameter, concerning for small bowel obstruction. No definite evidence of free air. Visualized osseous structures are unchanged. IMPRESSION: Similar gaseous dilatation of small-bowel loops measuring up to 6.5 cm in diameter, concerning for small bowel obstruction. Electronically Signed   By: Hart Robinsons M.D.   On: 07/13/2023 19:07   DG Abd Portable 1V-Small  Bowel Obstruction Protocol-initial, 8 hr delay Result Date: 07/13/2023 CLINICAL DATA:  Small-bowel obstruction. EXAM: PORTABLE ABDOMEN - 1 VIEW COMPARISON:  CT scan abdomen and pelvis from 07/12/2023. FINDINGS: There is gaseous dilatation of small bowel loops (diameter up to 6.3 cm), which is disproportionate to the degree of distention of the colon, favoring small bowel obstruction. No evidence of pneumoperitoneum, within the limitations of a supine film. No acute osseous abnormalities. The soft tissues are within normal limits. Surgical changes, devices, tubes and lines: None. IMPRESSION: *Gaseous dilatation of small bowel loops (diameter up to 6.3 cm), which is disproportionate to the degree of distention of the colon, favoring small bowel obstruction. Electronically Signed   By: Jules Schick M.D.   On: 07/13/2023 14:17   CT Renal Stone Study Result Date: 07/12/2023 CLINICAL DATA:  Flank pain. EXAM: CT ABDOMEN AND PELVIS WITHOUT CONTRAST TECHNIQUE: Multidetector CT imaging of the abdomen and pelvis was performed following the standard protocol without IV contrast. RADIATION DOSE REDUCTION: This exam was performed according to the departmental dose-optimization program which includes automated exposure control, adjustment of the mA and/or kV according to patient size and/or use of iterative reconstruction technique. COMPARISON:  November 09, 2011 FINDINGS: Lower chest: Mild to moderate severity scarring and/or atelectasis is seen within the bilateral lung bases. Hepatobiliary: No focal liver abnormality is seen. Status post cholecystectomy. No biliary dilatation. Pancreas: Unremarkable. No pancreatic ductal dilatation or surrounding inflammatory changes. Spleen: Normal in size without focal abnormality. Adrenals/Urinary Tract: Adrenal glands are unremarkable. Kidneys are normal, without renal calculi, focal lesion, or hydronephrosis. Bladder is unremarkable. Stomach/Bowel: Surgical sutures are seen throughout  the gastric region. The appendix is not clearly identified. Numerous dilated small bowel loops are seen throughout the abdomen and pelvis (maximum small bowel diameter of approximately 5.3 cm). A clear transition zone is not identified. Vascular/Lymphatic: No significant vascular findings are present. No enlarged abdominal or pelvic lymph nodes. Reproductive: Status post hysterectomy. No adnexal masses. Other: No abdominal wall hernia or abnormality. No abdominopelvic ascites. Musculoskeletal: A chronic appearing compression fracture deformity is present at the level of T12. Multilevel degenerative changes are seen throughout the lumbar spine. IMPRESSION: 1. Findings consistent with a partial small bowel obstruction versus ileus. 2. Evidence of prior cholecystectomy, gastric surgery and hysterectomy. 3. Chronic appearing compression fracture deformity at the level of T12. Electronically Signed   By: Aram Candela M.D.   On: 07/12/2023 19:04    Anti-infectives: Anti-infectives (From admission, onward)    Start     Dose/Rate Route Frequency Ordered Stop   07/13/23 1900  cefTRIAXone (ROCEPHIN) 1 g in sodium chloride 0.9 % 100 mL IVPB        1 g 200 mL/hr over 30 Minutes Intravenous Every 24 hours 07/13/23 1800          Assessment/Plan  pSBO v ileus  - CT scan showed dilation of her small bowel, but no obvious transition point. This is concerning for possible partial small bowel obstruction versus an ileus.  -  PO gastrografin given yesterday - KUB this AM with improving bowel gas pattern and increasing gas within colon - pt having bowel function  - start CLD and ADAT to FLD - keep K >4.0 and Mg >2.0 to optimize bowel function  - bedbound at baseline  FEN: CLD, IVF per primary VTE: [facility] ID: rocephin for UTI  - per TRH -  Morbid obesity  Bedbound Myelomalacia of cervical cord [name] HLD HTN Fibromyalgia  GERD Hx of prior gastric bypass  LOS: 2 days   I reviewed hospitalist notes,  last 24 h vitals and pain scores, last 48 h intake and output, last 24 h labs and trends, and last 24 h imaging results.  This care required moderate level of medical decision making.    Juliet Rude, PA-C [facility] 07/14/2023, 10:00 AM Please see Amion for pager number during day hours 7:00am-4:30pm

## 2023-07-14 NOTE — Plan of Care (Signed)

## 2023-07-14 NOTE — Progress Notes (Signed)
 PT Cancellation Note  Patient Details Name: [name] MRN: [id number] DOB: 1971/02/21   Cancelled Treatment:    Reason Eval/Treat Not Completed: PT screened, no needs identified, will sign off (Per chart review, it appears that pt is bedbound at baseline and has a hoyer lift at home. No acute PT needs identified. Will sign off.)   Gladys Damme 07/14/2023, 8:56 AM

## 2023-07-15 DIAGNOSIS — K56609 Unspecified intestinal obstruction, unspecified as to partial versus complete obstruction: Secondary | ICD-10-CM | POA: Diagnosis not present

## 2023-07-15 LAB — BASIC METABOLIC PANEL
Anion gap: 13 (ref 5–15)
BUN: 6 mg/dL (ref 6–20)
CO2: 36 mmol/L — ABNORMAL HIGH (ref 22–32)
Calcium: 8.2 mg/dL — ABNORMAL LOW (ref 8.9–10.3)
Chloride: 84 mmol/L — ABNORMAL LOW (ref 98–111)
Creatinine, Ser: 0.6 mg/dL (ref 0.44–1.00)
GFR, Estimated: >60 mL/min (ref >60)
Glucose, Bld: 78 mg/dL (ref 70–99)
Potassium: 3.6 mmol/L (ref 3.5–5.1)
Sodium: 133 mmol/L — ABNORMAL LOW (ref 135–145)

## 2023-07-15 LAB — CBC WITH DIFFERENTIAL/PLATELET
Abs Immature Granulocytes: 0.01 10*3/uL (ref 0.00–0.07)
Basophils Absolute: 0 10*3/uL (ref 0.0–0.1)
Basophils Relative: 0 %
Eosinophils Absolute: 0.3 10*3/uL (ref 0.0–0.5)
Eosinophils Relative: 5 %
HCT: 35.2 % — ABNORMAL LOW (ref 36.0–46.0)
Hemoglobin: 11.2 g/dL — ABNORMAL LOW (ref 12.0–15.0)
Immature Granulocytes: 0 %
Lymphocytes Relative: 23 %
Lymphs Abs: 1.3 10*3/uL (ref 0.7–4.0)
MCH: 28.9 pg (ref 26.0–34.0)
MCHC: 31.8 g/dL (ref 30.0–36.0)
MCV: 90.7 fL (ref 80.0–100.0)
Monocytes Absolute: 0.5 10*3/uL (ref 0.1–1.0)
Monocytes Relative: 8 %
Neutro Abs: 3.8 10*3/uL (ref 1.7–7.7)
Neutrophils Relative %: 64 %
Platelets: 248 10*3/uL (ref 150–400)
RBC: 3.88 MIL/uL (ref 3.87–5.11)
RDW: 13.2 % (ref 11.5–15.5)
WBC: 5.9 10*3/uL (ref 4.0–10.5)
nRBC: 0 % (ref 0.0–0.2)

## 2023-07-15 LAB — MAGNESIUM: Magnesium: 2.3 mg/dL (ref 1.7–2.4)

## 2023-07-15 LAB — BRAIN NATRIURETIC PEPTIDE: B Natriuretic Peptide: 40.3 pg/mL (ref 0.0–100.0)

## 2023-07-15 LAB — PHOSPHORUS: Phosphorus: 2.8 mg/dL (ref 2.5–4.6)

## 2023-07-15 NOTE — Progress Notes (Signed)
 PROGRESS NOTE                                                                                                                                                                                                             Patient Demographics:    [name], is a 53 y.o. female, DOB - 1971-05-03, NGE:[id number]  Outpatient Primary MD for the patient is Sheliah Hatch, MD    LOS - 3  Admit date - 07/12/2023    Chief Complaint  Patient presents with   Abdominal Pain   Flank Pain       Brief Narrative (HPI from H&P)   53 year old female, morbidly obese, with past medical history significant for myelomalacia of cervical cord, sleep apnea, hypertension, hyperlipidemia, fibromyalgia GERD.  Patient is bedbound.  Patient presented with abdominal pain, foul-smelling urine and confusion.  UA suggestive of likely UTI.  Have not visualized urine culture.  Will order urine culture.  CT of the abdomen pelvis revealed SBO, general surgery was consulted and she was admitted to the hospital.   Subjective:   Patient in bed, appears comfortable, denies any headache, no fever, no chest pain or pressure, no shortness of breath , no abdominal pain no nausea having bowel movements. No focal weakness.   Assessment  & Plan :   Small bowel obstruction: -Clinically improved General Surgery following, follow-up imaging, bowel function was to have returned no nausea tolerated clear liquid diet advance to soft diet on 07/15/2023, encouraged to increase activity although she is largely bedbound, defer management of this problem to general surgery.   Hypokalemia: replace and monitor   Possible UTI: -Follow urine culture. -IV Rocephin.   Morbid obesity hypoventilation syndrome/[name]: -Patient has not been using CPAP at home.  Follow-up with PCP for weight loss and outpatient sleep study if needed.   Hypertension: As needed hydralazine.    Asymptomatic run of SVT night of 07/13/2023.  Self resolved, placed on low-dose beta-blocker, stable TSH and free T4.  Her on telemetry.  Send echo looks stable with a EF of 60%.  Could be [name] related.       Condition - Fair  Family Communication  :   Reyne Dumas [phone]  on 07/14/2023  Code Status :   DNR  Consults  :  CCS  PUD Prophylaxis :  Procedures  :      CT - SBO      Disposition Plan  :    Status is: Inpatient   DVT Prophylaxis  :  Heparin added 07/14/23  heparin injection 5,000 Units Start: 07/14/23 1400 Place TED hose Start: 07/14/23 1022 SCDs Start: 07/13/23 0135    Lab Results  Component Value Date   PLT 248 07/15/2023    Diet :  Diet Order             DIET SOFT Fluid consistency: Thin  Diet effective now                    Inpatient Medications  Scheduled Meds:  ALPRAZolam  1 mg Oral BID   ARIPiprazole  15 mg Oral Daily   calcium carbonate  200 mg of elemental calcium Oral TID   cyanocobalamin  1,000 mcg Oral Daily   DULoxetine  60 mg Oral BID   feeding supplement  1 Container Oral TID BM   folic acid  1 mg Oral Daily   heparin injection (subcutaneous)  5,000 Units Subcutaneous Q8H   ipratropium-albuterol  3 mL Nebulization QID   metoprolol tartrate  25 mg Oral BID   modafinil  200 mg Oral BID   mometasone-formoterol  2 puff Inhalation BID   Oxcarbazepine  300 mg Oral BID   pantoprazole  40 mg Oral BID   potassium chloride  40 mEq Oral Once   pyridOXINE  100 mg Oral Daily   Continuous Infusions:  cefTRIAXone (ROCEPHIN)  IV Stopped (07/15/23 0712)   PRN Meds:.acetaminophen **OR** acetaminophen, albuterol, ALPRAZolam, fentaNYL (SUBLIMAZE) injection, ondansetron **OR** ondansetron (ZOFRAN) IV, traMADol  Antibiotics  :    Anti-infectives (From admission, onward)    Start     Dose/Rate Route Frequency Ordered Stop   07/13/23 1900  cefTRIAXone (ROCEPHIN) 1 g in sodium chloride 0.9 % 100 mL IVPB        1 g 200 mL/hr  over 30 Minutes Intravenous Every 24 hours 07/13/23 1800           Objective:   Vitals:   07/14/23 2300 07/15/23 0439 07/15/23 0904 07/15/23 0915  BP: 127/63 129/71    Pulse: 78 81    Resp: 18 (!) 21 20   Temp: 98.6 F (37 C) 97.8 F (36.6 C)    TempSrc: Oral Oral    SpO2: 98% 97% 99% 98%  Weight:      Height:        Wt Readings from Last 3 Encounters:  07/12/23 (!) 170.1 kg  01/20/23 (!) 170.1 kg  12/14/22 120.2 kg     Intake/Output Summary (Last 24 hours) at 07/15/2023 0933 Last data filed at 07/15/2023 0500 Gross per 24 hour  Intake --  Output 400 ml  Net -400 ml     Physical Exam  Awake Alert, No new F.N deficits, Normal affect [place].AT,PERRAL Supple Neck, No JVD,   Symmetrical Chest wall movement, Good air movement bilaterally, CTAB RRR,No Gallops,Rubs or new Murmurs,  +ve B.Sounds, Abd Soft, No tenderness,   No Cyanosis, Clubbing or edema     RN pressure injury documentation: Pressure Injury 11/21/22 Buttocks Right Stage 2 -  Partial thickness loss of dermis presenting as a shallow open injury with a red, pink wound bed without slough. (Active)  11/21/22 1849  Location: Buttocks  Location Orientation: Right  Staging: Stage 2 -  Partial thickness loss of dermis presenting as a shallow open injury with  a red, pink wound bed without slough.  Wound Description (Comments):   Present on Admission: Yes     Pressure Injury 11/21/22 Buttocks Left (Active)  11/21/22 2150  Location: Buttocks  Location Orientation: Left  Staging:   Wound Description (Comments):   Present on Admission: Yes      Data Review:    Recent Labs  Lab 07/12/23 1702 07/13/23 0555 07/14/23 0533 07/15/23 0542  WBC 8.6 8.0 6.1 5.9  HGB 13.2 14.1 11.5* 11.2*  HCT 41.0 44.1 36.5 35.2*  PLT 302 317 254 248  MCV 89.7 90.7 92.6 90.7  MCH 28.9 29.0 29.2 28.9  MCHC 32.2 32.0 31.5 31.8  RDW 12.7 12.9 13.2 13.2  LYMPHSABS  --   --  1.4 1.3  MONOABS  --   --  0.7 0.5  EOSABS  --   --   0.3 0.3  BASOSABS  --   --  0.0 0.0    Recent Labs  Lab 07/12/23 1702 07/12/23 2235 07/12/23 2300 07/13/23 0154 07/13/23 0555 07/13/23 1838 07/14/23 0533 07/14/23 0757 07/15/23 0542  NA 132* 134*  --   --   --  136  --  136 133*  K 2.5* 2.6*  --   --   --  2.9*  --  3.2* 3.6  CL 74* 76*  --   --   --  81*  --  85* 84*  CO2 >45* 43*  --   --   --  42*  --  38* 36*  ANIONGAP NOT CALCULATED 15  --   --   --  13  --  13 13  GLUCOSE 93 102*  --   --   --  87  --  73 78  BUN 11 12  --   --   --  14  --  10 6  CREATININE 0.69 0.68  --   --   --  0.77  --  0.54 0.60  AST 25  --   --   --   --   --   --   --   --   ALT 20  --   --   --   --   --   --   --   --   ALKPHOS 82  --   --   --   --   --   --   --   --   BILITOT 0.8  --   --   --   --   --   --   --   --   ALBUMIN 2.7*  --   --   --   --  2.6*  --   --   --   LATICACIDVEN  --   --   --  0.7  --   --   --   --   --   TSH  --   --  1.640  --   --   --  2.679  --   --   BNP  --   --   --   --   --   --  35.8  --  40.3  MG  --  2.0  --   --  1.9  --  2.6*  --  2.3  PHOS  --  2.5  --   --  3.4 3.4 2.0*  --  2.8  CALCIUM 8.4* 8.5*  --   --   --  8.2*  --  7.8* 8.2*      Recent Labs  Lab 07/12/23 1702 07/12/23 2235 07/12/23 2300 07/13/23 0154 07/13/23 0555 07/13/23 1838 07/14/23 0533 07/14/23 0757 07/15/23 0542  LATICACIDVEN  --   --   --  0.7  --   --   --   --   --   TSH  --   --  1.640  --   --   --  2.679  --   --   BNP  --   --   --   --   --   --  35.8  --  40.3  MG  --  2.0  --   --  1.9  --  2.6*  --  2.3  CALCIUM 8.4* 8.5*  --   --   --  8.2*  --  7.8* 8.2*    --------------------------------------------------------------------------------------------------------------- Lab Results  Component Value Date   CHOL 171 05/06/2022   HDL 49 (L) 05/06/2022   LDLCALC 98 05/06/2022   LDLDIRECT 76.0 11/24/2015   TRIG 142 05/06/2022   CHOLHDL 3.5 05/06/2022    Lab Results  Component Value Date   HGBA1C 5.3  05/06/2022   Recent Labs    07/14/23 0533  TSH 2.679  FREET4 1.09     Micro Results No results found for this or any previous visit (from the past 240 hours).  Radiology Report DG Abd 1 View Result Date: 07/14/2023 CLINICAL DATA:  Small bowel obstruction EXAM: ABDOMEN - 1 VIEW COMPARISON:  Abdominal radiographs 07/13/2023 FINDINGS: A series of multiple radiographs were obtained to encompass the entirety of the abdomen. There is persistent gaseous distension of several loops of small bowel in the central abdomen, however this appears improved compared to yesterday. Additionally, there is increased gas within the colon suggesting that the obstruction is resolving. No large free air. IMPRESSION: Improving bowel gas pattern with decreased distension of loops of small bowel and increasing gas within the colon. Electronically Signed   By: Malachy Moan M.D.   On: 07/14/2023 09:16   DG Chest Port 1 View Result Date: 07/14/2023 CLINICAL DATA:  Short of breath EXAM: PORTABLE CHEST 1 VIEW COMPARISON:  Prior chest x-ray 01/12/2023 FINDINGS: Stable cardiac and mediastinal contours. Increased pulmonary vascular congestion bordering on mild edema. Chronic elevation of the right hemidiaphragm with associated right lower lobe and right middle lobe atelectasis. No large effusion or pneumothorax. IMPRESSION: 1. Increased pulmonary vascular congestion now with mild interstitial edema. 2. Chronic elevation of the right hemidiaphragm with associated atelectasis in the right middle and right lower lobes. Electronically Signed   By: Malachy Moan M.D.   On: 07/14/2023 09:14   DG Abdomen 1 View Result Date: 07/13/2023 CLINICAL DATA:  Abdominal pain. EXAM: ABDOMEN - 1 VIEW COMPARISON:  Abdominal radiographs dated 07/13/2023 at 11:46 a.m. FINDINGS: Similar gaseous dilatation of small-bowel loops measuring up to 6.5 cm in diameter, concerning for small bowel obstruction. No definite evidence of free air.  Visualized osseous structures are unchanged. IMPRESSION: Similar gaseous dilatation of small-bowel loops measuring up to 6.5 cm in diameter, concerning for small bowel obstruction. Electronically Signed   By: Hart Robinsons M.D.   On: 07/13/2023 19:07   DG Abd Portable 1V-Small Bowel Obstruction Protocol-initial, 8 hr delay Result Date: 07/13/2023 CLINICAL DATA:  Small-bowel obstruction. EXAM: PORTABLE ABDOMEN - 1 VIEW COMPARISON:  CT scan abdomen and pelvis from 07/12/2023. FINDINGS: There is gaseous dilatation of small bowel loops (diameter up to 6.3  cm), which is disproportionate to the degree of distention of the colon, favoring small bowel obstruction. No evidence of pneumoperitoneum, within the limitations of a supine film. No acute osseous abnormalities. The soft tissues are within normal limits. Surgical changes, devices, tubes and lines: None. IMPRESSION: *Gaseous dilatation of small bowel loops (diameter up to 6.3 cm), which is disproportionate to the degree of distention of the colon, favoring small bowel obstruction. Electronically Signed   By: Jules Schick M.D.   On: 07/13/2023 14:17     Signature  -   Susa Raring M.D on 07/15/2023 at 9:33 AM   -  To page go to [email or web address]

## 2023-07-15 NOTE — Plan of Care (Signed)

## 2023-07-15 NOTE — Progress Notes (Addendum)
 Progress Note     Subjective: Tolerating liquid diet and continues to pass flatus and Bms. No abdominal pain nausea or vomiting. She tells me she has only had liquids and no solid food yet  Objective: Vital signs in last 24 hours: Temp:  [97.8 F (36.6 C)-98.7 F (37.1 C)] 97.8 F (36.6 C) (03/01 0439) Pulse Rate:  [78-87] 81 (03/01 0439) Resp:  [18-26] 20 (03/01 0904) BP: (116-136)/(63-111) 129/71 (03/01 0439) SpO2:  [93 %-100 %] 98 % (03/01 0915) Last BM Date : 07/14/23  Intake/Output from previous day: 02/28 0701 - 03/01 0700 In: -  Out: 400 [Urine:400] Intake/Output this shift: No intake/output data recorded.  PE: General: pleasant, WD, obese female who is laying in bed in NAD Abd: soft, NT, difficult to assess distention, no peritonitis   Lab Results:  Recent Labs    07/14/23 0533 07/15/23 0542  WBC 6.1 5.9  HGB 11.5* 11.2*  HCT 36.5 35.2*  PLT 254 248   BMET Recent Labs    07/14/23 0757 07/15/23 0542  NA 136 133*  K 3.2* 3.6  CL 85* 84*  CO2 38* 36*  GLUCOSE 73 78  BUN 10 6  CREATININE 0.54 0.60  CALCIUM 7.8* 8.2*   PT/INR No results for input(s): "LABPROT", "INR" in the last 72 hours. CMP     Component Value Date/Time   NA 133 (L) 07/15/2023 0542   NA 127 (A) 04/15/2019 0000   K 3.6 07/15/2023 0542   CL 84 (L) 07/15/2023 0542   CO2 36 (H) 07/15/2023 0542   GLUCOSE 78 07/15/2023 0542   BUN 6 07/15/2023 0542   BUN 7 04/15/2019 0000   CREATININE 0.60 07/15/2023 0542   CREATININE 0.56 05/06/2022 1150   CALCIUM 8.2 (L) 07/15/2023 0542   PROT 6.5 07/12/2023 1702   ALBUMIN 2.6 (L) 07/13/2023 1838   AST 25 07/12/2023 1702   ALT 20 07/12/2023 1702   ALKPHOS 82 07/12/2023 1702   BILITOT 0.8 07/12/2023 1702   GFRNONAA >60 07/15/2023 0542   GFRNONAA 83 02/27/2013 1519   GFRAA >60 08/02/2019 0209   GFRAA >89 02/27/2013 1519   Lipase     Component Value Date/Time   LIPASE 19.0 08/13/2008 1212       Studies/Results: DG Abd 1  View Result Date: 07/14/2023 CLINICAL DATA:  Small bowel obstruction EXAM: ABDOMEN - 1 VIEW COMPARISON:  Abdominal radiographs 07/13/2023 FINDINGS: A series of multiple radiographs were obtained to encompass the entirety of the abdomen. There is persistent gaseous distension of several loops of small bowel in the central abdomen, however this appears improved compared to yesterday. Additionally, there is increased gas within the colon suggesting that the obstruction is resolving. No large free air. IMPRESSION: Improving bowel gas pattern with decreased distension of loops of small bowel and increasing gas within the colon. Electronically Signed   By: Malachy Moan M.D.   On: 07/14/2023 09:16   DG Chest Port 1 View Result Date: 07/14/2023 CLINICAL DATA:  Short of breath EXAM: PORTABLE CHEST 1 VIEW COMPARISON:  Prior chest x-ray 01/12/2023 FINDINGS: Stable cardiac and mediastinal contours. Increased pulmonary vascular congestion bordering on mild edema. Chronic elevation of the right hemidiaphragm with associated right lower lobe and right middle lobe atelectasis. No large effusion or pneumothorax. IMPRESSION: 1. Increased pulmonary vascular congestion now with mild interstitial edema. 2. Chronic elevation of the right hemidiaphragm with associated atelectasis in the right middle and right lower lobes. Electronically Signed   By: Malachy Moan  M.D.   On: 07/14/2023 09:14   DG Abdomen 1 View Result Date: 07/13/2023 CLINICAL DATA:  Abdominal pain. EXAM: ABDOMEN - 1 VIEW COMPARISON:  Abdominal radiographs dated 07/13/2023 at 11:46 a.m. FINDINGS: Similar gaseous dilatation of small-bowel loops measuring up to 6.5 cm in diameter, concerning for small bowel obstruction. No definite evidence of free air. Visualized osseous structures are unchanged. IMPRESSION: Similar gaseous dilatation of small-bowel loops measuring up to 6.5 cm in diameter, concerning for small bowel obstruction. Electronically Signed   By:  Hart Robinsons M.D.   On: 07/13/2023 19:07   DG Abd Portable 1V-Small Bowel Obstruction Protocol-initial, 8 hr delay Result Date: 07/13/2023 CLINICAL DATA:  Small-bowel obstruction. EXAM: PORTABLE ABDOMEN - 1 VIEW COMPARISON:  CT scan abdomen and pelvis from 07/12/2023. FINDINGS: There is gaseous dilatation of small bowel loops (diameter up to 6.3 cm), which is disproportionate to the degree of distention of the colon, favoring small bowel obstruction. No evidence of pneumoperitoneum, within the limitations of a supine film. No acute osseous abnormalities. The soft tissues are within normal limits. Surgical changes, devices, tubes and lines: None. IMPRESSION: *Gaseous dilatation of small bowel loops (diameter up to 6.3 cm), which is disproportionate to the degree of distention of the colon, favoring small bowel obstruction. Electronically Signed   By: Jules Schick M.D.   On: 07/13/2023 14:17    Anti-infectives: Anti-infectives (From admission, onward)    Start     Dose/Rate Route Frequency Ordered Stop   07/13/23 1900  cefTRIAXone (ROCEPHIN) 1 g in sodium chloride 0.9 % 100 mL IVPB        1 g 200 mL/hr over 30 Minutes Intravenous Every 24 hours 07/13/23 1800          Assessment/Plan  pSBO v ileus  - CT scan showed dilation of her small bowel, but no obvious transition point. This is concerning for possible partial small bowel obstruction versus an ileus.  - PO gastrografin given 2/27 - KUB 2/28 with improving bowel gas pattern and increasing gas within colon - pt having bowel function  - advanced to soft diet - keep K >4.0 and Mg >2.0 to optimize bowel function  - bedbound at baseline  If she tolerates soft diet can dc from surgical standpoint. Surgery team will remain available as needed  FEN: soft, IVF per primary VTE: [facility] ID: rocephin for UTI  - per [facility] -  Morbid obesity  Bedbound Myelomalacia of cervical cord [name] HLD HTN Fibromyalgia  GERD Hx of prior gastric  bypass  LOS: 3 days   I reviewed hospitalist notes, last 24 h vitals and pain scores, last 48 h intake and output, last 24 h labs and trends, and last 24 h imaging results.     Eric Form, [facility] 07/15/2023, 9:33 AM Please see Amion for pager number during day hours 7:00am-4:30pm

## 2023-07-16 DIAGNOSIS — K56609 Unspecified intestinal obstruction, unspecified as to partial versus complete obstruction: Secondary | ICD-10-CM | POA: Diagnosis not present

## 2023-07-16 LAB — BASIC METABOLIC PANEL
Anion gap: 18 — ABNORMAL HIGH (ref 5–15)
BUN: <5 mg/dL — ABNORMAL LOW (ref 6–20)
CO2: 34 mmol/L — ABNORMAL HIGH (ref 22–32)
Calcium: 9.4 mg/dL (ref 8.9–10.3)
Chloride: 84 mmol/L — ABNORMAL LOW (ref 98–111)
Creatinine, Ser: 0.52 mg/dL (ref 0.44–1.00)
GFR, Estimated: >60 mL/min (ref >60)
Glucose, Bld: 80 mg/dL (ref 70–99)
Potassium: 3 mmol/L — ABNORMAL LOW (ref 3.5–5.1)
Sodium: 136 mmol/L (ref 135–145)

## 2023-07-16 LAB — CBC WITH DIFFERENTIAL/PLATELET
Abs Immature Granulocytes: 0.03 10*3/uL (ref 0.00–0.07)
Basophils Absolute: 0 10*3/uL (ref 0.0–0.1)
Basophils Relative: 0 %
Eosinophils Absolute: 0.2 10*3/uL (ref 0.0–0.5)
Eosinophils Relative: 3 %
HCT: 37.5 % (ref 36.0–46.0)
Hemoglobin: 11.9 g/dL — ABNORMAL LOW (ref 12.0–15.0)
Immature Granulocytes: 0 %
Lymphocytes Relative: 22 %
Lymphs Abs: 1.5 10*3/uL (ref 0.7–4.0)
MCH: 29 pg (ref 26.0–34.0)
MCHC: 31.7 g/dL (ref 30.0–36.0)
MCV: 91.2 fL (ref 80.0–100.0)
Monocytes Absolute: 0.5 10*3/uL (ref 0.1–1.0)
Monocytes Relative: 7 %
Neutro Abs: 4.7 10*3/uL (ref 1.7–7.7)
Neutrophils Relative %: 68 %
Platelets: 264 10*3/uL (ref 150–400)
RBC: 4.11 MIL/uL (ref 3.87–5.11)
RDW: 13.2 % (ref 11.5–15.5)
WBC: 6.8 10*3/uL (ref 4.0–10.5)
nRBC: 0 % (ref 0.0–0.2)

## 2023-07-16 LAB — MAGNESIUM: Magnesium: 1.8 mg/dL (ref 1.7–2.4)

## 2023-07-16 LAB — BRAIN NATRIURETIC PEPTIDE: B Natriuretic Peptide: 42.3 pg/mL (ref 0.0–100.0)

## 2023-07-16 LAB — PHOSPHORUS: Phosphorus: 3.1 mg/dL (ref 2.5–4.6)

## 2023-07-16 MED ORDER — IPRATROPIUM-ALBUTEROL 0.5-2.5 (3) MG/3ML IN SOLN
3.0000 mL | Freq: Four times a day (QID) | RESPIRATORY_TRACT | Status: DC
  Administered 2023-07-16 – 2023-07-17 (×3): 3 mL via RESPIRATORY_TRACT
  Filled 2023-07-16 (×3): qty 3

## 2023-07-16 MED ORDER — POTASSIUM CHLORIDE CRYS ER 20 MEQ PO TBCR
40.0000 meq | EXTENDED_RELEASE_TABLET | Freq: Two times a day (BID) | ORAL | Status: AC
  Administered 2023-07-16 (×2): 40 meq via ORAL
  Filled 2023-07-16 (×2): qty 2

## 2023-07-16 NOTE — Progress Notes (Addendum)
 PROGRESS NOTE                                                                                                                                                                                                             Patient Demographics:    [name], is a 53 y.o. female, DOB - 04/05/1971, WUJ:[id number]  Outpatient Primary MD for the patient is Sheliah Hatch, MD    LOS - 4  Admit date - 07/12/2023    Chief Complaint  Patient presents with   Abdominal Pain   Flank Pain       Brief Narrative (HPI from H&P)   53 year old female, morbidly obese, with past medical history significant for myelomalacia of cervical cord, sleep apnea, hypertension, hyperlipidemia, fibromyalgia GERD.  Patient is bedbound.  Patient presented with abdominal pain, foul-smelling urine and confusion.  UA suggestive of likely UTI.  Have not visualized urine culture.  Will order urine culture.  CT of the abdomen pelvis revealed SBO, general surgery was consulted and she was admitted to the hospital.   Subjective:   Patient in bed, appears comfortable, denies any headache, no fever, no chest pain or pressure, no shortness of breath , no abdominal pain. No focal weakness.   Assessment  & Plan :   Small bowel obstruction: Clinically improved General Surgery following, follow-up imaging, bowel function was to have returned to NML, advance to regular consistency heart healthy diet 07/16/2023 if tolerates discharged home on 07/17/2023, appreciate general surgery input.     Hypokalemia: replace and monitor   Possible UTI: -Follow urine culture. -IV Rocephin x 3.   Morbid obesity hypoventilation syndrome/[name]: -Patient has not been using CPAP at home.  Follow-up with PCP for weight loss and outpatient sleep study if needed.   Hypertension: As needed hydralazine.   Asymptomatic run of SVT night of 07/13/2023.  Self resolved, placed on low-dose  beta-blocker, stable TSH and free T4.  Her on telemetry.  Send echo looks stable with a EF of 60%.  Could be [name] related.       Condition - Fair  Family Communication  :   Reyne Dumas [phone]  on 07/14/2023, 12/16/2023  Code Status :   DNR  Consults  :  CCS  PUD Prophylaxis :    Procedures  :      CT -  SBO      Disposition Plan  :    Status is: Inpatient   DVT Prophylaxis  :  Heparin added 07/14/23  heparin injection 5,000 Units Start: 07/14/23 1400 Place TED hose Start: 07/14/23 1022 SCDs Start: 07/13/23 0135    Lab Results  Component Value Date   PLT 264 07/16/2023    Diet :  Diet Order             Diet Heart Room service appropriate? Yes; Fluid consistency: Thin  Diet effective now                    Inpatient Medications  Scheduled Meds:  ALPRAZolam  1 mg Oral BID   ARIPiprazole  15 mg Oral Daily   calcium carbonate  200 mg of elemental calcium Oral TID   cyanocobalamin  1,000 mcg Oral Daily   DULoxetine  60 mg Oral BID   feeding supplement  1 Container Oral TID BM   folic acid  1 mg Oral Daily   heparin injection (subcutaneous)  5,000 Units Subcutaneous Q8H   ipratropium-albuterol  3 mL Nebulization QID   metoprolol tartrate  25 mg Oral BID   modafinil  200 mg Oral BID   mometasone-formoterol  2 puff Inhalation BID   Oxcarbazepine  300 mg Oral BID   pantoprazole  40 mg Oral BID   potassium chloride  40 mEq Oral BID   pyridOXINE  100 mg Oral Daily   Continuous Infusions:  cefTRIAXone (ROCEPHIN)  IV 1 g (07/15/23 2136)   PRN Meds:.acetaminophen **OR** acetaminophen, albuterol, ALPRAZolam, fentaNYL (SUBLIMAZE) injection, ondansetron **OR** ondansetron (ZOFRAN) IV, traMADol  Antibiotics  :    Anti-infectives (From admission, onward)    Start     Dose/Rate Route Frequency Ordered Stop   07/13/23 1900  cefTRIAXone (ROCEPHIN) 1 g in sodium chloride 0.9 % 100 mL IVPB        1 g 200 mL/hr over 30 Minutes Intravenous Every 24 hours  07/13/23 1800           Objective:   Vitals:   07/16/23 0000 07/16/23 0400 07/16/23 0800 07/16/23 0820  BP: (!) 148/78 (!) 149/77 (!) 146/75   Pulse: 77 83 86   Resp: (!) 21 18 18    Temp:  98.2 F (36.8 C) 97.8 F (36.6 C)   TempSrc:  Axillary Oral   SpO2: 96% 94% 91% 92%  Weight:      Height:        Wt Readings from Last 3 Encounters:  07/12/23 (!) 170.1 kg  01/20/23 (!) 170.1 kg  12/14/22 120.2 kg     Intake/Output Summary (Last 24 hours) at 07/16/2023 0924 Last data filed at 07/15/2023 2100 Gross per 24 hour  Intake 0 ml  Output 1000 ml  Net -1000 ml     Physical Exam  Awake Alert, No new F.N deficits, Normal affect [place].AT,PERRAL Supple Neck, No JVD,   Symmetrical Chest wall movement, Good air movement bilaterally, CTAB RRR,No Gallops,Rubs or new Murmurs,  +ve B.Sounds, Abd Soft, No tenderness,   No Cyanosis, Clubbing or edema     RN pressure injury documentation: Pressure Injury 11/21/22 Buttocks Right Stage 2 -  Partial thickness loss of dermis presenting as a shallow open injury with a red, pink wound bed without slough. (Active)  11/21/22 1849  Location: Buttocks  Location Orientation: Right  Staging: Stage 2 -  Partial thickness loss of dermis presenting as a shallow open injury with a  red, pink wound bed without slough.  Wound Description (Comments):   Present on Admission: Yes     Pressure Injury 11/21/22 Buttocks Left (Active)  11/21/22 2150  Location: Buttocks  Location Orientation: Left  Staging:   Wound Description (Comments):   Present on Admission: Yes      Data Review:    Recent Labs  Lab 07/12/23 1702 07/13/23 0555 07/14/23 0533 07/15/23 0542 07/16/23 0535  WBC 8.6 8.0 6.1 5.9 6.8  HGB 13.2 14.1 11.5* 11.2* 11.9*  HCT 41.0 44.1 36.5 35.2* 37.5  PLT 302 317 254 248 264  MCV 89.7 90.7 92.6 90.7 91.2  MCH 28.9 29.0 29.2 28.9 29.0  MCHC 32.2 32.0 31.5 31.8 31.7  RDW 12.7 12.9 13.2 13.2 13.2  LYMPHSABS  --   --  1.4 1.3 1.5   MONOABS  --   --  0.7 0.5 0.5  EOSABS  --   --  0.3 0.3 0.2  BASOSABS  --   --  0.0 0.0 0.0    Recent Labs  Lab 07/12/23 1702 07/12/23 1702 07/12/23 2235 07/12/23 2300 07/13/23 0154 07/13/23 0555 07/13/23 1838 07/14/23 0533 07/14/23 0757 07/15/23 0542 07/16/23 0535  NA 132*  --  134*  --   --   --  136  --  136 133* 136  K 2.5*  --  2.6*  --   --   --  2.9*  --  3.2* 3.6 3.0*  CL 74*  --  76*  --   --   --  81*  --  85* 84* 84*  CO2 >45*  --  43*  --   --   --  42*  --  38* 36* 34*  ANIONGAP NOT CALCULATED  --  15  --   --   --  13  --  13 13 18*  GLUCOSE 93  --  102*  --   --   --  87  --  73 78 80  BUN 11  --  12  --   --   --  14  --  10 6 <5*  CREATININE 0.69  --  0.68  --   --   --  0.77  --  0.54 0.60 0.52  AST 25  --   --   --   --   --   --   --   --   --   --   ALT 20  --   --   --   --   --   --   --   --   --   --   ALKPHOS 82  --   --   --   --   --   --   --   --   --   --   BILITOT 0.8  --   --   --   --   --   --   --   --   --   --   ALBUMIN 2.7*  --   --   --   --   --  2.6*  --   --   --   --   LATICACIDVEN  --   --   --   --  0.7  --   --   --   --   --   --   TSH  --   --   --  1.640  --   --   --  2.679  --   --   --   BNP  --   --   --   --   --   --   --  35.8  --  40.3 42.3  MG  --   --  2.0  --   --  1.9  --  2.6*  --  2.3 1.8  PHOS  --    < > 2.5  --   --  3.4 3.4 2.0*  --  2.8 3.1  CALCIUM 8.4*  --  8.5*  --   --   --  8.2*  --  7.8* 8.2* 9.4   < > = values in this interval not displayed.      Recent Labs  Lab 07/12/23 2235 07/12/23 2300 07/13/23 0154 07/13/23 0555 07/13/23 1838 07/14/23 0533 07/14/23 0757 07/15/23 0542 07/16/23 0535  LATICACIDVEN  --   --  0.7  --   --   --   --   --   --   TSH  --  1.640  --   --   --  2.679  --   --   --   BNP  --   --   --   --   --  35.8  --  40.3 42.3  MG 2.0  --   --  1.9  --  2.6*  --  2.3 1.8  CALCIUM 8.5*  --   --   --  8.2*  --  7.8* 8.2* 9.4     --------------------------------------------------------------------------------------------------------------- Lab Results  Component Value Date   CHOL 171 05/06/2022   HDL 49 (L) 05/06/2022   LDLCALC 98 05/06/2022   LDLDIRECT 76.0 11/24/2015   TRIG 142 05/06/2022   CHOLHDL 3.5 05/06/2022    Lab Results  Component Value Date   HGBA1C 5.3 05/06/2022   Recent Labs    07/14/23 0533  TSH 2.679  FREET4 1.09     Micro Results No results found for this or any previous visit (from the past 240 hours).  Radiology Report No results found.    Signature  -   Susa Raring M.D on 07/16/2023 at 9:24 AM   -  To page go to [email or web address]

## 2023-07-16 NOTE — Plan of Care (Signed)

## 2023-07-16 NOTE — Progress Notes (Signed)
   Subjective/Chief Complaint: Multiple bowel movements Tolerating diet No complaints   Objective: Vital signs in last 24 hours: Temp:  [97.8 F (36.6 C)-98.4 F (36.9 C)] 97.8 F (36.6 C) (03/02 0800) Pulse Rate:  [77-89] 86 (03/02 0800) Resp:  [18-26] 18 (03/02 0800) BP: (146-153)/(75-86) 146/75 (03/02 0800) SpO2:  [91 %-100 %] 92 % (03/02 0820) Last BM Date : 07/14/23  Intake/Output from previous day: 03/01 0701 - 03/02 0700 In: 0  Out: 1000 [Urine:1000] Intake/Output this shift: No intake/output data recorded.  Abd - soft, non-tender  Lab Results:  Recent Labs    07/15/23 0542 07/16/23 0535  WBC 5.9 6.8  HGB 11.2* 11.9*  HCT 35.2* 37.5  PLT 248 264   BMET Recent Labs    07/15/23 0542 07/16/23 0535  NA 133* 136  K 3.6 3.0*  CL 84* 84*  CO2 36* 34*  GLUCOSE 78 80  BUN 6 <5*  CREATININE 0.60 0.52  CALCIUM 8.2* 9.4   PT/INR No results for input(s): "LABPROT", "INR" in the last 72 hours. ABG No results for input(s): "PHART", "HCO3" in the last 72 hours.  Invalid input(s): "PCO2", "PO2"  Studies/Results: DG Abd 1 View Result Date: 07/14/2023 CLINICAL DATA:  Small bowel obstruction EXAM: ABDOMEN - 1 VIEW COMPARISON:  Abdominal radiographs 07/13/2023 FINDINGS: A series of multiple radiographs were obtained to encompass the entirety of the abdomen. There is persistent gaseous distension of several loops of small bowel in the central abdomen, however this appears improved compared to yesterday. Additionally, there is increased gas within the colon suggesting that the obstruction is resolving. No large free air. IMPRESSION: Improving bowel gas pattern with decreased distension of loops of small bowel and increasing gas within the colon. Electronically Signed   By: Malachy Moan M.D.   On: 07/14/2023 09:16    Anti-infectives: Anti-infectives (From admission, onward)    Start     Dose/Rate Route Frequency Ordered Stop   07/13/23 1900  cefTRIAXone  (ROCEPHIN) 1 g in sodium chloride 0.9 % 100 mL IVPB        1 g 200 mL/hr over 30 Minutes Intravenous Every 24 hours 07/13/23 1800         Assessment/Plan: Ileus - resolved Discharge per primary team. We will sign off.  LOS: 4 days    Wynona Luna 07/16/2023

## 2023-07-17 DIAGNOSIS — K56609 Unspecified intestinal obstruction, unspecified as to partial versus complete obstruction: Secondary | ICD-10-CM | POA: Diagnosis not present

## 2023-07-17 LAB — BASIC METABOLIC PANEL
Anion gap: 13 (ref 5–15)
BUN: 6 mg/dL (ref 6–20)
CO2: 28 mmol/L (ref 22–32)
Calcium: 8.4 mg/dL — ABNORMAL LOW (ref 8.9–10.3)
Chloride: 91 mmol/L — ABNORMAL LOW (ref 98–111)
Creatinine, Ser: 0.58 mg/dL (ref 0.44–1.00)
GFR, Estimated: >60 mL/min (ref >60)
Glucose, Bld: 74 mg/dL (ref 70–99)
Potassium: 3.9 mmol/L (ref 3.5–5.1)
Sodium: 132 mmol/L — ABNORMAL LOW (ref 135–145)

## 2023-07-17 NOTE — Discharge Summary (Signed)
 [name] ZOX:[id number] DOB: Oct 09, 1970 DOA: 07/12/2023  PCP: Sheliah Hatch, MD  Admit date: 07/12/2023  Discharge date: 07/17/2023  Admitted From: Home   Disposition:  Home   Recommendations for Outpatient Follow-up:   Follow up with PCP in 1-2 weeks  PCP Please obtain BMP/CBC, 2 view CXR in 1week,  (see Discharge instructions)   PCP Please follow up on the following pending results: needs outpatient sleep study,   Home Health: None   Equipment/Devices: None  Consultations: CCS Discharge Condition: Stable    CODE STATUS: Full    Diet Recommendation: Heart Healthy     Chief Complaint  Patient presents with   Abdominal Pain   Flank Pain     Brief history of present illness from the day of admission and additional interim summary    53 year old female, morbidly obese, with past medical history significant for myelomalacia of cervical cord, sleep apnea, hypertension, hyperlipidemia, fibromyalgia GERD. Patient is bedbound. Patient presented with abdominal pain, foul-smelling urine and confusion. UA suggestive of likely UTI. Have not visualized urine culture. Will order urine culture. CT of the abdomen pelvis revealed SBO, general surgery was consulted and she was admitted to the hospital.                                                                  Hospital Course   Small bowel obstruction: Clinically improved General Surgery following, follow-up imaging, bowel function was to have returned to [facility], advance to regular consistency heart healthy diet 07/16/2023, tolerated the diet well completely symptom-free will be discharged home.  Try to cut down narcotics and muscle relaxers which could be worsening her SBO and bowel motility.   Hypokalemia: replace and monitor   Possible UTI: Finished treatment  with Rocephin.  Clinically this problem has resolved no dysuria.   Morbid obesity hypoventilation syndrome/[name]: -Patient has not been using CPAP at home.  Follow-up with PCP for weight loss and outpatient sleep study if needed.   Hypertension: As needed hydralazine.   Asymptomatic run of SVT night of 07/13/2023.  Self resolved, resolved after beta-blocker continue home dose, stable TSH and free T4, stable subsequently on telemetry, recent echo looks stable with a EF of 60%.  Could be [name] related.  PCP to get outpatient sleep study, one-time outpatient follow-up with cardiology also would be appropriate.    Discharge diagnosis     Principal Problem:   SBO (small bowel obstruction) (HCC) Active Problems:   Essential hypertension   Compressive cervical cord myelomalacia (HCC)   Hypokalemia   Obesity hypoventilation syndrome (HCC)   Bedbound    Discharge instructions    Discharge Instructions     Discharge instructions   Complete by: As directed    Follow with Primary MD Sheliah Hatch, MD in  7 days   Get CBC, CMP, magnesium, phosphorus-  checked next visit with your primary MD   Activity: As tolerated with Full fall precautions use walker/cane & assistance as needed  Disposition Home   Diet: Heart Healthy     Check your Weight same time everyday, if you gain over 2 pounds, or you develop in leg swelling, experience more shortness of breath or chest pain, call your Primary MD immediately. Follow Cardiac Low Salt Diet and 1.5 lit/day fluid restriction.  Special Instructions: If you have smoked or chewed Tobacco  in the last 2 yrs please stop smoking, stop any regular Alcohol  and or any Recreational drug use.  On your next visit with your primary care physician please Get Medicines reviewed and adjusted.  Please request your Prim.MD to go over all Hospital Tests and Procedure/Radiological results at the follow up, please get all Hospital records sent to your Prim MD by  signing hospital release before you go home.  If you experience worsening of your admission symptoms, develop shortness of breath, life threatening emergency, suicidal or homicidal thoughts you must seek medical attention immediately by calling 911 or calling your MD immediately  if symptoms less severe.  You Must read complete instructions/literature along with all the possible adverse reactions/side effects for all the Medicines you take and that have been prescribed to you. Take any new Medicines after you have completely understood and accpet all the possible adverse reactions/side effects.   Do not drive when taking Pain medications.  Do not take more than prescribed Pain, Sleep and Anxiety Medications  Wear Seat belts while driving.   Increase activity slowly   Complete by: As directed        Discharge Medications   Allergies as of 07/17/2023       Reactions   Erythromycin Anaphylaxis   Niacin Anaphylaxis, Shortness Of Breath, Swelling   Swelling and "problems breathing"   Sulfamethoxazole-trimethoprim Anaphylaxis, Swelling, Rash, Other (See Comments)   Throat closed and the eyes became swollen   Aspirin Hives   Benzoin Compound Rash   Cephalexin Hives   Tolerated ceftriaxone and cefepime 07/2019   Iohexol Hives   Pt treated with PO benedryl   Lisinopril Hives   Naproxen Hives   Pnu-imune [pneumococcal Polysaccharide Vaccine] Rash   Sulfonamide Derivatives Rash        Medication List     STOP taking these medications    clonazePAM 1 MG tablet Commonly known as: KLONOPIN   cyclobenzaprine 10 MG tablet Commonly known as: FLEXERIL   HYDROcodone-acetaminophen 10-325 MG tablet Commonly known as: NORCO       TAKE these medications    albuterol 108 (90 Base) MCG/ACT inhaler Commonly known as: VENTOLIN HFA Inhale 1 puff into the lungs every 6 (six) hours as needed for wheezing or shortness of breath.   ALPRAZolam 1 MG tablet Commonly known as: XANAX TAKE 1  TABLET BY MOUTH 2 TIMES DAILY   ARIPiprazole 15 MG tablet Commonly known as: ABILIFY TAKE 1 TABLET BY MOUTH EVERY DAY   Armodafinil 250 MG tablet Take 1 tablet (250 mg total) by mouth in the morning and at bedtime.   Biotin 10 MG Tabs Take 1 tablet (10 mg total) by mouth in the morning.   buPROPion 300 MG 24 hr tablet Commonly known as: WELLBUTRIN XL TAKE 1 TABLET BY MOUTH EVERY DAY   calcium citrate 950 (200 Ca) MG tablet Commonly known as: CALCITRATE - dosed in mg elemental calcium Take  1 tablet (200 mg of elemental calcium total) by mouth daily.   cyanocobalamin 1000 MCG tablet Commonly known as: VITAMIN B12 TAKE 1 TABLET BY MOUTH ONCE DAILY   diclofenac Sodium 1 % Gel Commonly known as: VOLTAREN Apply 4 g topically 4 (four) times daily.   DULoxetine 60 MG capsule Commonly known as: CYMBALTA Take 2 capsules (120 mg total) by mouth daily. What changed:  how much to take when to take this   FeroSul 325 (65 Fe) MG tablet Generic drug: ferrous sulfate TAKE 1 TABLET BY MOUTH EVERY MORNING WITH BREAKFAST   fluticasone-salmeterol 250-50 MCG/ACT Aepb Commonly known as: ADVAIR Inhale 1 puff into the lungs every 12 (twelve) hours.   folic acid 1 MG tablet Commonly known as: FOLVITE TAKE 1 TABLET BY MOUTH ONCE DAILY   furosemide 40 MG tablet Commonly known as: LASIX Take 1 tablet by mouth daily. May take an additional tablet by mouth IF weight gain of 5 lbs and CALL MD for further instructions.   gabapentin 600 MG tablet Commonly known as: NEURONTIN Take 1 tablet (600 mg total) by mouth 2 (two) times daily as needed. Pt takes daily - can take up to twice daily   GNP Vitamin B-1 100 MG tablet Generic drug: thiamine TAKE 1 TABLET BY MOUTH EVERY DAY   ipratropium-albuterol 0.5-2.5 (3) MG/3ML Soln Commonly known as: DUONEB Take 3 mLs by nebulization every 6 (six) hours as needed (wheezing/shortness of breath).   magnesium oxide 400 (240 Mg) MG tablet Commonly known  as: MAG-OX Take 1 tablet by mouth daily.   nebivolol 10 MG tablet Commonly known as: BYSTOLIC TAKE 1 TABLET BY MOUTH EVERY DAY   nystatin powder Commonly known as: MYCOSTATIN/NYSTOP APPLY 1 APPLICATION TOPICALLY 3 TIMES DAILY What changed:  how much to take how to take this when to take this reasons to take this additional instructions   Oxcarbazepine 300 MG tablet Commonly known as: TRILEPTAL Take 300 mg by mouth 2 (two) times daily.   pantoprazole 40 MG tablet Commonly known as: PROTONIX TAKE 1 TABLET BY MOUTH 2 TIMES DAILY   phentermine 37.5 MG capsule Take 1 capsule (37.5 mg total) by mouth every morning.   potassium chloride 20 MEQ packet Commonly known as: KLOR-CON Take 1 packet by mouth daily. Take with lasix.   promethazine 25 MG tablet Commonly known as: PHENERGAN TAKE 1 TABLET BY MOUTH EVERY 6 HOURS AS NEEDED FOR NAUSEA AND VOMITING   promethazine-dextromethorphan 6.25-15 MG/5ML syrup Commonly known as: PROMETHAZINE-DM Take 5 mLs by mouth 4 (four) times daily as needed. What changed: reasons to take this   pyridOXINE 100 MG tablet Commonly known as: VITAMIN B6 TAKE 1 TABLET BY MOUTH ONCE DAILY         Follow-up Information     Sheliah Hatch, MD. Schedule an appointment as soon as possible for a visit in 1 week(s).   Specialty: Family Medicine Contact information: [address] [place] [address] [phone]                 Major procedures and Radiology Reports - PLEASE review detailed and final reports thoroughly  -      DG Abd 1 View Result Date: 07/14/2023 CLINICAL DATA:  Small bowel obstruction EXAM: ABDOMEN - 1 VIEW COMPARISON:  Abdominal radiographs 07/13/2023 FINDINGS: A series of multiple radiographs were obtained to encompass the entirety of the abdomen. There is persistent gaseous distension of several loops of small bowel in the central  abdomen, however this appears improved compared to yesterday.  Additionally, there is increased gas within the colon suggesting that the obstruction is resolving. No large free air. IMPRESSION: Improving bowel gas pattern with decreased distension of loops of small bowel and increasing gas within the colon. Electronically Signed   By: Malachy Moan M.D.   On: 07/14/2023 09:16   DG Chest Port 1 View Result Date: 07/14/2023 CLINICAL DATA:  Short of breath EXAM: PORTABLE CHEST 1 VIEW COMPARISON:  Prior chest x-ray 01/12/2023 FINDINGS: Stable cardiac and mediastinal contours. Increased pulmonary vascular congestion bordering on mild edema. Chronic elevation of the right hemidiaphragm with associated right lower lobe and right middle lobe atelectasis. No large effusion or pneumothorax. IMPRESSION: 1. Increased pulmonary vascular congestion now with mild interstitial edema. 2. Chronic elevation of the right hemidiaphragm with associated atelectasis in the right middle and right lower lobes. Electronically Signed   By: Malachy Moan M.D.   On: 07/14/2023 09:14   DG Abdomen 1 View Result Date: 07/13/2023 CLINICAL DATA:  Abdominal pain. EXAM: ABDOMEN - 1 VIEW COMPARISON:  Abdominal radiographs dated 07/13/2023 at 11:46 a.m. FINDINGS: Similar gaseous dilatation of small-bowel loops measuring up to 6.5 cm in diameter, concerning for small bowel obstruction. No definite evidence of free air. Visualized osseous structures are unchanged. IMPRESSION: Similar gaseous dilatation of small-bowel loops measuring up to 6.5 cm in diameter, concerning for small bowel obstruction. Electronically Signed   By: Hart Robinsons M.D.   On: 07/13/2023 19:07   DG Abd Portable 1V-Small Bowel Obstruction Protocol-initial, 8 hr delay Result Date: 07/13/2023 CLINICAL DATA:  Small-bowel obstruction. EXAM: PORTABLE ABDOMEN - 1 VIEW COMPARISON:  CT scan abdomen and pelvis from 07/12/2023. FINDINGS: There is gaseous dilatation of small bowel loops (diameter up to 6.3 cm), which is disproportionate  to the degree of distention of the colon, favoring small bowel obstruction. No evidence of pneumoperitoneum, within the limitations of a supine film. No acute osseous abnormalities. The soft tissues are within normal limits. Surgical changes, devices, tubes and lines: None. IMPRESSION: *Gaseous dilatation of small bowel loops (diameter up to 6.3 cm), which is disproportionate to the degree of distention of the colon, favoring small bowel obstruction. Electronically Signed   By: Jules Schick M.D.   On: 07/13/2023 14:17   CT Renal Stone Study Result Date: 07/12/2023 CLINICAL DATA:  Flank pain. EXAM: CT ABDOMEN AND PELVIS WITHOUT CONTRAST TECHNIQUE: Multidetector CT imaging of the abdomen and pelvis was performed following the standard protocol without IV contrast. RADIATION DOSE REDUCTION: This exam was performed according to the departmental dose-optimization program which includes automated exposure control, adjustment of the mA and/or kV according to patient size and/or use of iterative reconstruction technique. COMPARISON:  November 09, 2011 FINDINGS: Lower chest: Mild to moderate severity scarring and/or atelectasis is seen within the bilateral lung bases. Hepatobiliary: No focal liver abnormality is seen. Status post cholecystectomy. No biliary dilatation. Pancreas: Unremarkable. No pancreatic ductal dilatation or surrounding inflammatory changes. Spleen: Normal in size without focal abnormality. Adrenals/Urinary Tract: Adrenal glands are unremarkable. Kidneys are normal, without renal calculi, focal lesion, or hydronephrosis. Bladder is unremarkable. Stomach/Bowel: Surgical sutures are seen throughout the gastric region. The appendix is not clearly identified. Numerous dilated small bowel loops are seen throughout the abdomen and pelvis (maximum small bowel diameter of approximately 5.3 cm). A clear transition zone is not identified. Vascular/Lymphatic: No significant vascular findings are present. No enlarged  abdominal or pelvic lymph nodes. Reproductive: Status post hysterectomy. No adnexal masses. Other:  No abdominal wall hernia or abnormality. No abdominopelvic ascites. Musculoskeletal: A chronic appearing compression fracture deformity is present at the level of T12. Multilevel degenerative changes are seen throughout the lumbar spine. IMPRESSION: 1. Findings consistent with a partial small bowel obstruction versus ileus. 2. Evidence of prior cholecystectomy, gastric surgery and hysterectomy. 3. Chronic appearing compression fracture deformity at the level of T12. Electronically Signed   By: Aram Candela M.D.   On: 07/12/2023 19:04      Today   Subjective    [name] today has no headache,no chest abdominal pain,no new weakness tingling or numbness, feels much better wants to go home today.    Objective   Blood pressure (!) 158/87, pulse 93, temperature 98.5 F (36.9 C), temperature source Oral, resp. rate (!) 24, height 5\' 1"  (1.549 m), weight (!) 170.1 kg, SpO2 91%.   Intake/Output Summary (Last 24 hours) at 07/17/2023 1004 Last data filed at 07/17/2023 0947 Gross per 24 hour  Intake --  Output 1775 ml  Net -1775 ml    Exam  Awake Alert, No new F.N deficits,    [place].AT,PERRAL Supple Neck,   Symmetrical Chest wall movement, Good air movement bilaterally, CTAB RRR,No Gallops,   +ve B.Sounds, Abd Soft, Non tender,  No Cyanosis, Clubbing or edema    Data Review   Recent Labs  Lab 07/12/23 1702 07/13/23 0555 07/14/23 0533 07/15/23 0542 07/16/23 0535  WBC 8.6 8.0 6.1 5.9 6.8  HGB 13.2 14.1 11.5* 11.2* 11.9*  HCT 41.0 44.1 36.5 35.2* 37.5  PLT 302 317 254 248 264  MCV 89.7 90.7 92.6 90.7 91.2  MCH 28.9 29.0 29.2 28.9 29.0  MCHC 32.2 32.0 31.5 31.8 31.7  RDW 12.7 12.9 13.2 13.2 13.2  LYMPHSABS  --   --  1.4 1.3 1.5  MONOABS  --   --  0.7 0.5 0.5  EOSABS  --   --  0.3 0.3 0.2  BASOSABS  --   --  0.0 0.0 0.0    Recent Labs  Lab 07/12/23 1702 07/12/23 1702  07/12/23 2235 07/12/23 2300 07/13/23 0154 07/13/23 0555 07/13/23 1838 07/14/23 0533 07/14/23 0757 07/15/23 0542 07/16/23 0535 07/17/23 0457  NA 132*  --  134*  --   --   --  136  --  136 133* 136 132*  K 2.5*  --  2.6*  --   --   --  2.9*  --  3.2* 3.6 3.0* 3.9  CL 74*  --  76*  --   --   --  81*  --  85* 84* 84* 91*  CO2 >45*  --  43*  --   --   --  42*  --  38* 36* 34* 28  ANIONGAP NOT CALCULATED  --  15  --   --   --  13  --  13 13 18* 13  GLUCOSE 93  --  102*  --   --   --  87  --  73 78 80 74  BUN 11  --  12  --   --   --  14  --  10 6 <5* 6  CREATININE 0.69  --  0.68  --   --   --  0.77  --  0.54 0.60 0.52 0.58  AST 25  --   --   --   --   --   --   --   --   --   --   --  ALT 20  --   --   --   --   --   --   --   --   --   --   --   ALKPHOS 82  --   --   --   --   --   --   --   --   --   --   --   BILITOT 0.8  --   --   --   --   --   --   --   --   --   --   --   ALBUMIN 2.7*  --   --   --   --   --  2.6*  --   --   --   --   --   LATICACIDVEN  --   --   --   --  0.7  --   --   --   --   --   --   --   TSH  --   --   --  1.640  --   --   --  2.679  --   --   --   --   BNP  --   --   --   --   --   --   --  35.8  --  40.3 42.3  --   MG  --   --  2.0  --   --  1.9  --  2.6*  --  2.3 1.8  --   PHOS  --    < > 2.5  --   --  3.4 3.4 2.0*  --  2.8 3.1  --   CALCIUM 8.4*  --  8.5*  --   --   --  8.2*  --  7.8* 8.2* 9.4 8.4*   < > = values in this interval not displayed.    Total Time in preparing paper work, data evaluation and todays exam - 35 minutes  Signature  -    Susa Raring M.D on 07/17/2023 at 10:04 AM   -  To page go to [email or web address]

## 2023-07-17 NOTE — Progress Notes (Addendum)
 Patient discharge for home. Left unit with Ptar. Son to receive patient at home per CM. Discharge education done prior with emphasis on follow up appointment and medications.

## 2023-07-17 NOTE — TOC Transition Note (Signed)
 Transition of Care [facility]) - Discharge Note   Patient Details  Name: [name] MRN: [id number] Date of Birth: [birth year]  [facility] CM/SW Contact:  Leone Haven, RN Phone Number: 07/17/2023, 1:02 PM   Clinical Narrative:    For dc , NCM notified Artavia with [facility], ptar scheduled.     Barriers to Discharge: Transportation, Continued Medical Work up   Patient Goals and CMS Choice Patient states their goals for this hospitalization and ongoing recovery are:: Return home          Discharge Placement                       Discharge Plan and Services Additional resources added to the After Visit Summary for     Discharge Planning Services: CM Consult                                 Social Drivers of Health (SDOH) Interventions SDOH Screenings   Food Insecurity: No Food Insecurity (07/13/2023)  Housing: Low Risk  (07/13/2023)  Transportation Needs: No Transportation Needs (07/13/2023)  Utilities: Not At Risk (07/13/2023)  Alcohol Screen: Low Risk  (11/02/2022)  Depression (PHQ2-9): Medium Risk (06/26/2023)  Financial Resource Strain: Low Risk  (11/02/2022)  Physical Activity: Inactive (11/02/2022)  Social Connections: Unknown (11/02/2022)  Recent Concern: Social Connections - Socially Isolated (11/02/2022)  Stress: Stress Concern Present (11/02/2022)  Tobacco Use: Low Risk  (07/12/2023)     Readmission Risk Interventions    07/14/2023   12:56 PM 04/24/2022   11:10 AM  Readmission Risk Prevention Plan  Transportation Screening Complete Complete  HRI or Home Care Consult  Complete  Social Work Consult for Recovery Care Planning/Counseling  Complete  Palliative Care Screening  Not Applicable  Medication Review Oceanographer) Referral to Pharmacy Referral to Pharmacy  [facility] or Home Care Consult Complete   Palliative Care Screening Not Applicable

## 2023-07-17 NOTE — Plan of Care (Signed)

## 2023-07-17 NOTE — Discharge Instructions (Signed)
 Follow with Primary MD Sheliah Hatch, MD in 7 days   Get CBC, CMP, magnesium, phosphorus-  checked next visit with your primary MD   Activity: As tolerated with Full fall precautions use walker/cane & assistance as needed  Disposition Home   Diet: Heart Healthy     Check your Weight same time everyday, if you gain over 2 pounds, or you develop in leg swelling, experience more shortness of breath or chest pain, call your Primary MD immediately. Follow Cardiac Low Salt Diet and 1.5 lit/day fluid restriction.  Special Instructions: If you have smoked or chewed Tobacco  in the last 2 yrs please stop smoking, stop any regular Alcohol  and or any Recreational drug use.  On your next visit with your primary care physician please Get Medicines reviewed and adjusted.  Please request your Prim.MD to go over all Hospital Tests and Procedure/Radiological results at the follow up, please get all Hospital records sent to your Prim MD by signing hospital release before you go home.  If you experience worsening of your admission symptoms, develop shortness of breath, life threatening emergency, suicidal or homicidal thoughts you must seek medical attention immediately by calling 911 or calling your MD immediately  if symptoms less severe.  You Must read complete instructions/literature along with all the possible adverse reactions/side effects for all the Medicines you take and that have been prescribed to you. Take any new Medicines after you have completely understood and accpet all the possible adverse reactions/side effects.   Do not drive when taking Pain medications.  Do not take more than prescribed Pain, Sleep and Anxiety Medications  Wear Seat belts while driving.

## 2023-07-17 NOTE — Care Management Important Message (Signed)
 Important Message  Patient Details  Name: LEAIRA FULLAM MRN: [id number] Date of Birth: 1971/03/08   Important Message Given:  Yes - Medicare IM  Patient left prior to IM delivery will mail a copy to the patient home address.    Hallee Mckenny 07/17/2023, 1:52 PM

## 2023-07-18 ENCOUNTER — Encounter: Payer: Self-pay | Admitting: Family Medicine

## 2023-07-18 ENCOUNTER — Telehealth: Payer: Self-pay | Admitting: *Deleted

## 2023-07-18 ENCOUNTER — Other Ambulatory Visit (HOSPITAL_COMMUNITY): Payer: Self-pay | Admitting: Family Medicine

## 2023-07-18 ENCOUNTER — Other Ambulatory Visit: Payer: Self-pay

## 2023-07-18 DIAGNOSIS — I110 Hypertensive heart disease with heart failure: Secondary | ICD-10-CM | POA: Diagnosis not present

## 2023-07-18 DIAGNOSIS — G629 Polyneuropathy, unspecified: Secondary | ICD-10-CM | POA: Diagnosis not present

## 2023-07-18 DIAGNOSIS — G9529 Other cord compression: Secondary | ICD-10-CM | POA: Diagnosis not present

## 2023-07-18 DIAGNOSIS — G9589 Other specified diseases of spinal cord: Secondary | ICD-10-CM

## 2023-07-18 DIAGNOSIS — Z7951 Long term (current) use of inhaled steroids: Secondary | ICD-10-CM | POA: Diagnosis not present

## 2023-07-18 DIAGNOSIS — Z79899 Other long term (current) drug therapy: Secondary | ICD-10-CM | POA: Diagnosis not present

## 2023-07-18 DIAGNOSIS — G4733 Obstructive sleep apnea (adult) (pediatric): Secondary | ICD-10-CM | POA: Diagnosis not present

## 2023-07-18 DIAGNOSIS — K760 Fatty (change of) liver, not elsewhere classified: Secondary | ICD-10-CM | POA: Diagnosis not present

## 2023-07-18 DIAGNOSIS — G894 Chronic pain syndrome: Secondary | ICD-10-CM | POA: Diagnosis not present

## 2023-07-18 DIAGNOSIS — A499 Bacterial infection, unspecified: Secondary | ICD-10-CM

## 2023-07-18 DIAGNOSIS — E559 Vitamin D deficiency, unspecified: Secondary | ICD-10-CM | POA: Diagnosis not present

## 2023-07-18 DIAGNOSIS — G43909 Migraine, unspecified, not intractable, without status migrainosus: Secondary | ICD-10-CM | POA: Diagnosis not present

## 2023-07-18 DIAGNOSIS — M300 Polyarteritis nodosa: Secondary | ICD-10-CM | POA: Diagnosis not present

## 2023-07-18 DIAGNOSIS — I5033 Acute on chronic diastolic (congestive) heart failure: Secondary | ICD-10-CM | POA: Diagnosis not present

## 2023-07-18 DIAGNOSIS — M4802 Spinal stenosis, cervical region: Secondary | ICD-10-CM | POA: Diagnosis not present

## 2023-07-18 DIAGNOSIS — E538 Deficiency of other specified B group vitamins: Secondary | ICD-10-CM | POA: Diagnosis not present

## 2023-07-18 DIAGNOSIS — J45901 Unspecified asthma with (acute) exacerbation: Secondary | ICD-10-CM | POA: Diagnosis not present

## 2023-07-18 DIAGNOSIS — M797 Fibromyalgia: Secondary | ICD-10-CM | POA: Diagnosis not present

## 2023-07-18 DIAGNOSIS — L400 Psoriasis vulgaris: Secondary | ICD-10-CM | POA: Diagnosis not present

## 2023-07-18 DIAGNOSIS — Z87440 Personal history of urinary (tract) infections: Secondary | ICD-10-CM | POA: Diagnosis not present

## 2023-07-18 DIAGNOSIS — I872 Venous insufficiency (chronic) (peripheral): Secondary | ICD-10-CM | POA: Diagnosis not present

## 2023-07-18 DIAGNOSIS — K219 Gastro-esophageal reflux disease without esophagitis: Secondary | ICD-10-CM | POA: Diagnosis not present

## 2023-07-18 DIAGNOSIS — E785 Hyperlipidemia, unspecified: Secondary | ICD-10-CM | POA: Diagnosis not present

## 2023-07-18 LAB — CULTURE, OB URINE: Culture: >=100000 — AB

## 2023-07-18 NOTE — Progress Notes (Signed)
 I contacted [facility] in [place] to follow up on her hospital bed and was told "patient is needing a bariatric bed" I clarified that's all the order needed to say and was told yes. I did reorder the original hospital bed AND ordered a bariatric bed to send with that just in case. I faxed that to [phone] along with patient demographics and updated patient.

## 2023-07-18 NOTE — Transitions of Care (Post Inpatient/ED Visit) (Signed)
   07/18/2023  Name: [name] MRN: [id number] DOB: 1970/06/10  Today's TOC FU Call Status: Today's TOC FU Call Status:: Unsuccessful Call (1st Attempt) Unsuccessful Call (1st Attempt) Date: 07/18/23 (attempted calls x 2 to patient: first attempt- busy signal; second attempt- left voice message requesting call back)  Attempted to reach the patient regarding the most recent Inpatient visit; left HIPAA compliant voice message requesting call back  Follow Up Plan: Additional outreach attempts will be made to reach the patient to complete the Transitions of Care (Post Inpatient visit) call.   Pls call/ message for questions,  Caryl Pina, RN, BSN, CCRN Alumnus RN Care Manager  Transitions of Care  [facility] [phone]: direct office

## 2023-07-19 ENCOUNTER — Telehealth: Payer: Self-pay | Admitting: *Deleted

## 2023-07-19 NOTE — Patient Instructions (Signed)
 Visit Information  Thank you for taking time to visit with me today. Please don't hesitate to contact me if I can be of assistance to you before our next scheduled telephone appointment.  Our next appointment is by telephone on Thursday 07/27/23 at 2:00 pm  Please call the care guide team at [phone] if you need to cancel or reschedule your appointment.   Patient Goals/Self-Care Activities: Participate in Transition of Care Program/Attend TOC scheduled calls Take all medications as prescribed Attend all scheduled provider appointments Call provider office for new concerns or questions  Continue working with the home health team that is involved in your care Please continue regularly monitoring and caring for your bedsore- make sure that the home health nurse evaluates this area with every home visit Continue using home oxygen as prescribed Use assistive devices as needed to prevent falls If you believe your condition is getting worse- contact your care providers (doctors) promptly- reaching out to your doctor early when you have concerns can prevent you from having to go to the hospital   Following is a copy of your care plan:   Goals Addressed             This Visit's Progress    TOC 30-day Program Care Plan   On track    Current Barriers:  Medication management confirms uses compliance packaging; son assists with all aspects of medication management Provider appointments 07/26/23- PCP; 08/02/23- pulmonary provider; 08/07/23- ID provider (new patient- for resistant bacteria in urine noted on 07/18/23); 08/09/23- cardiology provider- new patient appointment Home Health services reports has been established with [facility] "for several years now" Equipment/DME see DME notes in [facility] template: patient currently has pending orders through [employer] for: bariatric hospital bed; electric wheelchair; also reports has new order for new CPAP machine- reports expects CPAP machine  to "arrive any day now;" reports she is using a friends "borrowed hover-round" currently, prn for assistance with mobility; reports difficulty getting her current standard wheelchair through door frames in her home Established with [employer] for transportation services Fragile state of health, multiple progressing chronic health conditions- reports has been bed-bound since "2021;" reports "doctors are not sure what happened to cause me to become the way I am--- I used to be completely independent until I got sick in 2021" Total care patient: has applied for medicaid/ CAPS program: reports spoke to her caseworker yesterday, who told her, "you are number 13 on our list- so services will be starting sooner than later;" confirms she has contact information for medicaid/ CAPS referral caseworker- confirms she stays in touch with case worker "regularly" 3 inpatient hospitalizations x last 12 months:  at time of last hospital discharge on 07/17/23--- PCP messaged patient on 07/18/23 that her urine culture resulted with a resistant bacteria- PCP made referral for ID provider, given patient's multiple documented allergies to antibiotics:  verified the appointment is approximately 3 weeks away-- on 08/07/23-- will message PCP to make her aware of same Reports has "small, dime-sized" open bedsore starting on "the right side of my butt;" need to ensure proper ongoing skin care/ assessments by home health team, currently established in patient's care  RNCM Clinical Goal(s):  Patient will work with the Care Management team over the next 30 days to address Transition of Care Barriers: Support at home Home Health services Equipment/DME Need for ongoing support around clinical condition/ care at home in total care patient take all medications exactly as prescribed and will call provider  for medication related questions as evidenced by review of same with patient and caregiver during weekly TOC 30-day program outreach calls attend  all scheduled medical appointments: as noted above as evidenced by review of same with patient and caregiver- son during [facility] 30-day program weekly outreach calls  through collaboration with Medical illustrator, provider, and care team.   Interventions: Evaluation of current treatment plan related to  self management and patient's adherence to plan as established by provider  Transitions of Care:  New goal. 07/19/23 Durable Medical Equipment (DME) needs assessed with patient/caregiver Doctor Visits  - discussed the importance of doctor visits Communication with PCP- re: delay in getting a timely appointment with ID, referred by PCP 07/18/23-- patient resulted positive urine culture during hospitalization for "resistant bacteria in urine;" ID provider office visit not scheduled until 08/07/23 Contacted provider for patient needs to address possible need for antibiotics prior to scheduled ID provider office visit Post discharge activity limitations prescribed by provider reviewed Reviewed Signs and symptoms of infection Reviewed/ provided education around action plan is signs/ symptoms UTI (or other source) infection arise Reviewed/ provided education/ reinforcement around care of low-grade sacral bedsore reported on (R) buttock/ dime sized/ need to follow high protein diet/ need for regular assessments by home health nurse; role of home health services with importance of participation/ ongoing engagement Reviewed/ provided education/ reinforcement around safe use of home O2- both caregiver and patient verbalize very good understanding of same Full medication reconciliation/ review completed; self-manages medications and denies questions/ concerns around medications today: uses compliance packaging: report very good/ 100% adherence  Reviewed multiple upcoming provider office visits as above: confirmed patient is aware of all and has plans to attend as scheduled Discussed options for care if patient feels  medical evaluation needed prior to scheduled PCP provider office visit on 07/26/23 Care Coordination Outreach to PCP as above Provided my direct contact information should questions/ concerns/ needs arise post-TOC initial call, prior to next [facility] 30-day program RN CM telephone visit   Successfully enrolled into 30-day TOC program  Patient Goals/Self-Care Activities: Participate in Transition of Care Program/Attend [facility] scheduled calls Take all medications as prescribed Attend all scheduled provider appointments Call provider office for new concerns or questions  Continue working with the home health team that is involved in your care Please continue regularly monitoring and caring for your bedsore- make sure that the home health nurse evaluates this area with every home visit Continue using home oxygen as prescribed Use assistive devices as needed to prevent falls If you believe your condition is getting worse- contact your care providers (doctors) promptly- reaching out to your doctor early when you have concerns can prevent you from having to go to the hospital  Follow Up Plan:  Telephone follow up appointment with care management team member scheduled for:  Thursday 07/27/23 at 2:00 pm          Patient verbalizes understanding of instructions and care plan provided today and agrees to view in MyChart. Active MyChart status and patient understanding of how to access instructions and care plan via MyChart confirmed with patient.     If you are experiencing a Mental Health or Behavioral Health Crisis or need someone to talk to, please  call the Suicide and Crisis Lifeline: 988 call the [place] National Suicide Prevention Lifeline: 416-646-9822 or TTY: 630-281-6728 TTY (613)169-3795) to talk to a trained counselor call 1-800-273-TALK (toll free, 24 hour hotline) go to [facility] [address], [place] [phone]) call the [facility]  Line: 6618248746 call 911   Pls call/ message for questions,  Caryl Pina, RN, BSN, CCRN Alumnus RN Care Manager  Transitions of Care  VBCI - [facility] [phone]: direct office

## 2023-07-19 NOTE — Transitions of Care (Post Inpatient/ED Visit) (Signed)
 07/19/2023  Name: [name] MRN: [id number] DOB: [birth year]  Today's TOC FU Call Status: Today's TOC FU Call Status:: Successful TOC FU Call Completed TOC FU Call Complete Date: 07/19/23 Patient's Name and Date of Birth confirmed.  Transition Care Management Follow-up Telephone Call Date of Discharge: 07/17/23 Discharge Facility: [facility]) Type of Discharge: Inpatient Admission Primary Inpatient Discharge Diagnosis:: Abdominal pain/ SBO in setting of bed-bound patient at baseline secondary to cervical myelomalacia How have you been since you were released from the hospital?: Better ("I am doing better than I was.  I am bedbound and my son has been taking care of me by himself for the last several years; he pretty much does everything for me.  Home Health has been active for years.  I am on the medicaid and CAPS waiting list") Any questions or concerns?: No  Items Reviewed: Did you receive and understand the discharge instructions provided?: Yes (thoroughly reviewed with patient who verbalizes good understanding of same) Medications obtained,verified, and reconciled?: Yes (Medications Reviewed) (Full medication reconciliation/ review completed; no concerns or discrepancies identified; self-manages medications with assistance of son- uses compliance packaging; denies questions/ concerns around medications today) Any new allergies since your discharge?: No Dietary orders reviewed?: Yes Type of Diet Ordered:: "Healthy as possible" Do you have support at home?: Yes People in Home: child(ren), adult Name of Support/Comfort Primary Source: Reports requires assistance for all self-care activities- minimally independent; resides with supportive adult son who assists as/ if needed/ indicated-- patient reports "he pretty much has to do everything for me"  Medications Reviewed Today: Medications Reviewed Today     Reviewed by Michaela Corner, RN (Registered Nurse) on 07/19/23 at 1527  Med  List Status: <None>   Medication Order Taking? Sig Documenting Provider Last Dose Status Informant  albuterol (VENTOLIN HFA) 108 (90 Base) MCG/ACT inhaler [id number] Yes Inhale 1 puff into the lungs every 6 (six) hours as needed for wheezing or shortness of breath. [provider] Taking Active Self, Child, Pharmacy Records  ALPRAZolam Prudy Feeler) 1 MG tablet [id number] Yes TAKE 1 TABLET BY MOUTH 2 TIMES DAILY Sheliah Hatch, MD Taking Active Self, Pharmacy Records, Child  ARIPiprazole (ABILIFY) 15 MG tablet [id number] Yes TAKE 1 TABLET BY MOUTH EVERY DAY Sheliah Hatch, MD Taking Active Self, Pharmacy Records, Child  Armodafinil 250 MG tablet [id number] Yes Take 1 tablet (250 mg total) by mouth in the morning and at bedtime. Sheliah Hatch, MD Taking Active Self, Pharmacy Records, Child  Biotin 10 MG TABS [id number] Yes Take 1 tablet (10 mg total) by mouth in the morning. Sheliah Hatch, MD Taking Active Self, Pharmacy Records, Child  buPROPion (WELLBUTRIN XL) 300 MG 24 hr tablet [id number] Yes TAKE 1 TABLET BY MOUTH EVERY DAY Sheliah Hatch, MD Taking Active Self, Pharmacy Records, Child  calcium citrate (CALCITRATE - DOSED IN MG ELEMENTAL CALCIUM) 950 (200 Ca) MG tablet [id number] Yes Take 1 tablet (200 mg of elemental calcium total) by mouth daily. Sheliah Hatch, MD Taking Active Self, Pharmacy Records, Child  cyanocobalamin (VITAMIN B12) 1000 MCG tablet [id number] Yes TAKE 1 TABLET BY MOUTH ONCE DAILY Sheliah Hatch, MD Taking Active Self, Pharmacy Records, Child  diclofenac Sodium (VOLTAREN) 1 % GEL [id number] Yes Apply 4 g topically 4 (four) times daily. Sheliah Hatch, MD Taking Active Self, Pharmacy Records, Child  DULoxetine (CYMBALTA) 60 MG capsule [id number] Yes Take 2 capsules (120 mg total) by mouth daily.  Patient taking differently: Take  60 mg by mouth 2 (two) times daily.   Sheliah Hatch, MD Taking Active Self, Pharmacy Records, Child  FEROSUL  325 [phone]) MG tablet [id number] Yes TAKE 1 TABLET BY MOUTH EVERY MORNING WITH BREAKFAST Sheliah Hatch, MD Taking Active Self, Pharmacy Records, Child  fluticasone-salmeterol (ADVAIR) 250-50 MCG/ACT AEPB [id number] Yes Inhale 1 puff into the lungs every 12 (twelve) hours. Luciano Cutter, MD Taking Active Self, Pharmacy Records, Child  folic acid (FOLVITE) 1 MG tablet [id number] Yes TAKE 1 TABLET BY MOUTH ONCE DAILY Sheliah Hatch, MD Taking Active Self, Pharmacy Records, Child  furosemide (LASIX) 40 MG tablet [id number] Yes Take 1 tablet by mouth daily. May take an additional tablet by mouth IF weight gain of 5 lbs and CALL MD for further instructions. Sheliah Hatch, MD Taking Active Self, Pharmacy Records, Child  gabapentin (NEURONTIN) 600 MG tablet [id number] Yes Take 1 tablet (600 mg total) by mouth 2 (two) times daily as needed. Pt takes daily - can take up to twice daily Noralee Stain, DO Taking Active Self, Pharmacy Records, Child  GNP VITAMIN B-1 100 MG tablet [id number] Yes TAKE 1 TABLET BY MOUTH EVERY DAY Sheliah Hatch, MD Taking Active Self, Pharmacy Records, Child  ipratropium-albuterol (DUONEB) 0.5-2.5 (3) MG/3ML SOLN [id number] Yes Take 3 mLs by nebulization every 6 (six) hours as needed (wheezing/shortness of breath). [place], Eric J, DO Taking Active Self, Pharmacy Records, Child  magnesium oxide (MAG-OX) 400 (240 Mg) MG tablet [id number] Yes Take 1 tablet by mouth daily. [provider] Taking Active Self, Child, Pharmacy Records  nebivolol (BYSTOLIC) 10 MG tablet [id number] Yes TAKE 1 TABLET BY MOUTH EVERY DAY Sheliah Hatch, MD Taking Active Self, Pharmacy Records, Child  nystatin (MYCOSTATIN/NYSTOP) powder [id number] Yes APPLY 1 APPLICATION TOPICALLY 3 TIMES DAILY  Patient taking differently: Apply 1 Application topically 3 (three) times daily as needed (yeast infection).   Sheliah Hatch, MD Taking Active Self, Pharmacy Records, Child  Oxcarbazepine  (TRILEPTAL) 300 MG tablet [id number] Yes Take 300 mg by mouth 2 (two) times daily. [provider] Taking Active Self, Pharmacy Records, Child  pantoprazole (PROTONIX) 40 MG tablet [id number] Yes TAKE 1 TABLET BY MOUTH 2 TIMES DAILY Sheliah Hatch, MD Taking Active Self, Pharmacy Records, Child  phentermine 37.5 MG capsule [id number] Yes Take 1 capsule (37.5 mg total) by mouth every morning. Sheliah Hatch, MD Taking Active Self, Pharmacy Records, Child  potassium chloride (KLOR-CON) 20 MEQ packet [id number] Yes Take 1 packet by mouth daily. Take with lasix. Noralee Stain, DO Taking Active Self, Child, Pharmacy Records  promethazine (PHENERGAN) 25 MG tablet [id number] Yes TAKE 1 TABLET BY MOUTH EVERY 6 HOURS AS NEEDED FOR NAUSEA AND VOMITING Sheliah Hatch, MD Taking Active Self, Pharmacy Records, Child  promethazine-dextromethorphan (PROMETHAZINE-DM) 6.25-15 MG/5ML syrup [id number] Yes Take 5 mLs by mouth 4 (four) times daily as needed.  Patient taking differently: Take 5 mLs by mouth 4 (four) times daily as needed for cough.   Sheliah Hatch, MD Taking Active Self, Pharmacy Records, Child  pyridOXINE (VITAMIN B6) 100 MG tablet [id number] Yes TAKE 1 TABLET BY MOUTH ONCE DAILY Sheliah Hatch, MD Taking Active Self, Pharmacy Records, Child  Med List Note Lenor Derrick, CPhT 06/04/21 1816): Son Cristal Deer [phone]           Home Care and Equipment/Supplies: Were Home Health Services Ordered?: Yes Name of Home Health Agency:: [facility]: patient reports "they have been  involved for years, and they came yesterday; they are "back in and active" PT/ OT/ RN Has Agency set up a time to come to your home?: Yes First Home Health Visit Date: 07/18/23 Any new equipment or medical supplies ordered?: No (Reports she is currently working with PCP to obtain mechanical/ motorized wheelchair- has ordered and patient was updated today by her PCP: she is getting  ready to call [facility] today to provide them with the information they need)  Functional Questionnaire: Do you need assistance with bathing/showering or dressing?: Yes (son bathes patient- she is bedbound) Do you need assistance with meal preparation?: Yes (son prepares all meals) Do you need assistance with eating?: No Do you have difficulty maintaining continence: No ("sometimes;" son has to assist with all toileting needs) Do you need assistance with getting out of bed/getting out of a chair/moving?: Yes (son has to manage all mobility needs) Do you have difficulty managing or taking your medications?: No (uses compliance packaging-- son assists with all aspects of medication management)  Follow up appointments reviewed: PCP Follow-up appointment confirmed?: Yes Date of PCP follow-up appointment?: 07/26/22 Follow-up Provider: PCP Specialist Hospital Follow-up appointment confirmed?: Yes Date of Specialist follow-up appointment?: 08/02/23 Follow-Up Specialty Provider:: 08/02/23- pulmonary provider; 08/07/23- ID provider Do you need transportation to your follow-up appointment?: No (uses [employer] transportation at baseline) Do you understand care options if your condition(s) worsen?: Yes-patient verbalized understanding  SDOH Interventions Today    Flowsheet Row Most Recent Value  SDOH Interventions   Food Insecurity Interventions Intervention Not Indicated  Housing Interventions Intervention Not Indicated  [lives in single family home with her adult son]  Transportation Interventions Intervention Not Indicated  [established with / uses [employer] regularly- at baseline]  Utilities Interventions Intervention Not Indicated       Goals Addressed             This Visit's Progress    TOC 30-day Program Care Plan   On track    Current Barriers:  Medication management confirms uses compliance packaging; son assists with all aspects of medication management Provider appointments 07/26/23- PCP;  08/02/23- pulmonary provider; 08/07/23- ID provider (new patient- for resistant bacteria in urine noted on 07/18/23); 08/09/23- cardiology provider- new patient appointment Home Health services reports has been established with [facility] "for several years now" Equipment/DME see DME notes in [facility] template: patient currently has pending orders through [employer] for: bariatric hospital bed; electric wheelchair; also reports has new order for new CPAP machine- reports expects CPAP machine to "arrive any day now;" reports she is using a friends "borrowed hover-round" currently, prn for assistance with mobility; reports difficulty getting her current standard wheelchair through door frames in her home Established with [employer] for transportation services Fragile state of health, multiple progressing chronic health conditions- reports has been bed-bound since "2021;" reports "doctors are not sure what happened to cause me to become the way I am--- I used to be completely independent until I got sick in 2021" Total care patient: has applied for medicaid/ CAPS program: reports spoke to her caseworker yesterday, who told her, "you are number 13 on our list- so services will be starting sooner than later;" confirms she has contact information for medicaid/ CAPS referral caseworker- confirms she stays in touch with case worker "regularly" 3 inpatient hospitalizations x last 12 months:  at time of last hospital discharge on 07/17/23--- PCP messaged patient on 07/18/23 that her urine culture resulted with a resistant bacteria- PCP made referral for  ID provider, given patient's multiple documented allergies to antibiotics:  verified the appointment is approximately 3 weeks away-- on 08/07/23-- will message PCP to make her aware of same Reports has "small, dime-sized" open bedsore starting on "the right side of my butt;" need to ensure proper ongoing skin care/ assessments by home health team, currently established in  patient's care  RNCM Clinical Goal(s):  Patient will work with the Care Management team over the next 30 days to address Transition of Care Barriers: Support at home Home Health services Equipment/DME Need for ongoing support around clinical condition/ care at home in total care patient take all medications exactly as prescribed and will call provider for medication related questions as evidenced by review of same with patient and caregiver during weekly TOC 30-day program outreach calls attend all scheduled medical appointments: as noted above as evidenced by review of same with patient and caregiver- son during [facility] 30-day program weekly outreach calls  through collaboration with Medical illustrator, provider, and care team.   Interventions: Evaluation of current treatment plan related to  self management and patient's adherence to plan as established by provider  Transitions of Care:  New goal. 07/19/23 Durable Medical Equipment (DME) needs assessed with patient/caregiver Doctor Visits  - discussed the importance of doctor visits Communication with PCP- re: delay in getting a timely appointment with ID, referred by PCP 07/18/23-- patient resulted positive urine culture during hospitalization for "resistant bacteria in urine;" ID provider office visit not scheduled until 08/07/23 Contacted provider for patient needs to address possible need for antibiotics prior to scheduled ID provider office visit Post discharge activity limitations prescribed by provider reviewed Reviewed Signs and symptoms of infection Reviewed/ provided education around action plan is signs/ symptoms UTI (or other source) infection arise Reviewed/ provided education/ reinforcement around care of low-grade sacral bedsore reported on (R) buttock/ dime sized/ need to follow high protein diet/ need for regular assessments by home health nurse; role of home health services with importance of participation/ ongoing  engagement Reviewed/ provided education/ reinforcement around safe use of home O2- both caregiver and patient verbalize very good understanding of same Full medication reconciliation/ review completed; self-manages medications and denies questions/ concerns around medications today: uses compliance packaging: report very good/ 100% adherence  Reviewed multiple upcoming provider office visits as above: confirmed patient is aware of all and has plans to attend as scheduled Discussed options for care if patient feels medical evaluation needed prior to scheduled PCP provider office visit on 07/26/23 Care Coordination Outreach to PCP as above Provided my direct contact information should questions/ concerns/ needs arise post-TOC initial call, prior to next [facility] 30-day program RN CM telephone visit   Successfully enrolled into 30-day TOC program  Patient Goals/Self-Care Activities: Participate in Transition of Care Program/Attend TOC scheduled calls Take all medications as prescribed Attend all scheduled provider appointments Call provider office for new concerns or questions  Continue working with the home health team that is involved in your care Please continue regularly monitoring and caring for your bedsore- make sure that the home health nurse evaluates this area with every home visit Continue using home oxygen as prescribed Use assistive devices as needed to prevent falls If you believe your condition is getting worse- contact your care providers (doctors) promptly- reaching out to your doctor early when you have concerns can prevent you from having to go to the hospital  Follow Up Plan:  Telephone follow up appointment with care management team member scheduled for:  Thursday 07/27/23 at 2:00 pm          Total time spent from review to signing of note/ including any care coordination interventions: 150 minutes/ complex patient; creation of complex plan of care  Pls call/ message for  questions,  Caryl Pina, RN, BSN, CCRN Alumnus RN Care Manager  Transitions of Care  [facility]BCI - [facility] [phone]: direct office

## 2023-07-20 DIAGNOSIS — Z87440 Personal history of urinary (tract) infections: Secondary | ICD-10-CM | POA: Diagnosis not present

## 2023-07-20 DIAGNOSIS — K219 Gastro-esophageal reflux disease without esophagitis: Secondary | ICD-10-CM | POA: Diagnosis not present

## 2023-07-20 DIAGNOSIS — E559 Vitamin D deficiency, unspecified: Secondary | ICD-10-CM | POA: Diagnosis not present

## 2023-07-20 DIAGNOSIS — M300 Polyarteritis nodosa: Secondary | ICD-10-CM | POA: Diagnosis not present

## 2023-07-20 DIAGNOSIS — M797 Fibromyalgia: Secondary | ICD-10-CM | POA: Diagnosis not present

## 2023-07-20 DIAGNOSIS — I5033 Acute on chronic diastolic (congestive) heart failure: Secondary | ICD-10-CM | POA: Diagnosis not present

## 2023-07-20 DIAGNOSIS — I110 Hypertensive heart disease with heart failure: Secondary | ICD-10-CM | POA: Diagnosis not present

## 2023-07-20 DIAGNOSIS — M4802 Spinal stenosis, cervical region: Secondary | ICD-10-CM | POA: Diagnosis not present

## 2023-07-20 DIAGNOSIS — K760 Fatty (change of) liver, not elsewhere classified: Secondary | ICD-10-CM | POA: Diagnosis not present

## 2023-07-20 DIAGNOSIS — J45901 Unspecified asthma with (acute) exacerbation: Secondary | ICD-10-CM | POA: Diagnosis not present

## 2023-07-20 DIAGNOSIS — G4733 Obstructive sleep apnea (adult) (pediatric): Secondary | ICD-10-CM | POA: Diagnosis not present

## 2023-07-20 DIAGNOSIS — Z79899 Other long term (current) drug therapy: Secondary | ICD-10-CM | POA: Diagnosis not present

## 2023-07-20 DIAGNOSIS — E538 Deficiency of other specified B group vitamins: Secondary | ICD-10-CM | POA: Diagnosis not present

## 2023-07-20 DIAGNOSIS — G629 Polyneuropathy, unspecified: Secondary | ICD-10-CM | POA: Diagnosis not present

## 2023-07-20 DIAGNOSIS — G9529 Other cord compression: Secondary | ICD-10-CM | POA: Diagnosis not present

## 2023-07-20 DIAGNOSIS — L400 Psoriasis vulgaris: Secondary | ICD-10-CM | POA: Diagnosis not present

## 2023-07-20 DIAGNOSIS — E785 Hyperlipidemia, unspecified: Secondary | ICD-10-CM | POA: Diagnosis not present

## 2023-07-20 DIAGNOSIS — G894 Chronic pain syndrome: Secondary | ICD-10-CM | POA: Diagnosis not present

## 2023-07-20 DIAGNOSIS — G43909 Migraine, unspecified, not intractable, without status migrainosus: Secondary | ICD-10-CM | POA: Diagnosis not present

## 2023-07-20 DIAGNOSIS — I872 Venous insufficiency (chronic) (peripheral): Secondary | ICD-10-CM | POA: Diagnosis not present

## 2023-07-20 DIAGNOSIS — Z7951 Long term (current) use of inhaled steroids: Secondary | ICD-10-CM | POA: Diagnosis not present

## 2023-07-21 ENCOUNTER — Telehealth: Payer: Self-pay | Admitting: Family Medicine

## 2023-07-21 DIAGNOSIS — M300 Polyarteritis nodosa: Secondary | ICD-10-CM | POA: Diagnosis not present

## 2023-07-21 DIAGNOSIS — Z79899 Other long term (current) drug therapy: Secondary | ICD-10-CM | POA: Diagnosis not present

## 2023-07-21 DIAGNOSIS — L400 Psoriasis vulgaris: Secondary | ICD-10-CM | POA: Diagnosis not present

## 2023-07-21 DIAGNOSIS — K760 Fatty (change of) liver, not elsewhere classified: Secondary | ICD-10-CM | POA: Diagnosis not present

## 2023-07-21 DIAGNOSIS — I5033 Acute on chronic diastolic (congestive) heart failure: Secondary | ICD-10-CM | POA: Diagnosis not present

## 2023-07-21 DIAGNOSIS — M797 Fibromyalgia: Secondary | ICD-10-CM | POA: Diagnosis not present

## 2023-07-21 DIAGNOSIS — G43909 Migraine, unspecified, not intractable, without status migrainosus: Secondary | ICD-10-CM | POA: Diagnosis not present

## 2023-07-21 DIAGNOSIS — K219 Gastro-esophageal reflux disease without esophagitis: Secondary | ICD-10-CM | POA: Diagnosis not present

## 2023-07-21 DIAGNOSIS — M4802 Spinal stenosis, cervical region: Secondary | ICD-10-CM | POA: Diagnosis not present

## 2023-07-21 DIAGNOSIS — Z7951 Long term (current) use of inhaled steroids: Secondary | ICD-10-CM | POA: Diagnosis not present

## 2023-07-21 DIAGNOSIS — E559 Vitamin D deficiency, unspecified: Secondary | ICD-10-CM | POA: Diagnosis not present

## 2023-07-21 DIAGNOSIS — I872 Venous insufficiency (chronic) (peripheral): Secondary | ICD-10-CM | POA: Diagnosis not present

## 2023-07-21 DIAGNOSIS — G9529 Other cord compression: Secondary | ICD-10-CM | POA: Diagnosis not present

## 2023-07-21 DIAGNOSIS — E538 Deficiency of other specified B group vitamins: Secondary | ICD-10-CM | POA: Diagnosis not present

## 2023-07-21 DIAGNOSIS — G629 Polyneuropathy, unspecified: Secondary | ICD-10-CM | POA: Diagnosis not present

## 2023-07-21 DIAGNOSIS — G4733 Obstructive sleep apnea (adult) (pediatric): Secondary | ICD-10-CM | POA: Diagnosis not present

## 2023-07-21 DIAGNOSIS — G894 Chronic pain syndrome: Secondary | ICD-10-CM | POA: Diagnosis not present

## 2023-07-21 DIAGNOSIS — J45901 Unspecified asthma with (acute) exacerbation: Secondary | ICD-10-CM | POA: Diagnosis not present

## 2023-07-21 DIAGNOSIS — Z87440 Personal history of urinary (tract) infections: Secondary | ICD-10-CM | POA: Diagnosis not present

## 2023-07-21 DIAGNOSIS — I110 Hypertensive heart disease with heart failure: Secondary | ICD-10-CM | POA: Diagnosis not present

## 2023-07-21 DIAGNOSIS — E785 Hyperlipidemia, unspecified: Secondary | ICD-10-CM | POA: Diagnosis not present

## 2023-07-21 NOTE — Telephone Encounter (Signed)
 Placed in your sign folder

## 2023-07-21 NOTE — Telephone Encounter (Signed)
 Home Health Certification or Plan of Care Tracking  Is this a Certification or Plan of Care? Plan of Care  [facility] Agency: [facility]  Order Number:  [id number]  Has charge sheet been attached? Yes  Where has form been placed:   Labeled & placed in provider bin

## 2023-07-24 ENCOUNTER — Telehealth: Payer: Self-pay | Admitting: Family Medicine

## 2023-07-24 ENCOUNTER — Ambulatory Visit: Payer: 59 | Admitting: Clinical

## 2023-07-24 DIAGNOSIS — F419 Anxiety disorder, unspecified: Secondary | ICD-10-CM | POA: Diagnosis not present

## 2023-07-24 DIAGNOSIS — M797 Fibromyalgia: Secondary | ICD-10-CM | POA: Diagnosis not present

## 2023-07-24 DIAGNOSIS — J45901 Unspecified asthma with (acute) exacerbation: Secondary | ICD-10-CM | POA: Diagnosis not present

## 2023-07-24 DIAGNOSIS — Z7951 Long term (current) use of inhaled steroids: Secondary | ICD-10-CM | POA: Diagnosis not present

## 2023-07-24 DIAGNOSIS — G894 Chronic pain syndrome: Secondary | ICD-10-CM | POA: Diagnosis not present

## 2023-07-24 DIAGNOSIS — I5033 Acute on chronic diastolic (congestive) heart failure: Secondary | ICD-10-CM | POA: Diagnosis not present

## 2023-07-24 DIAGNOSIS — G9529 Other cord compression: Secondary | ICD-10-CM | POA: Diagnosis not present

## 2023-07-24 DIAGNOSIS — G4733 Obstructive sleep apnea (adult) (pediatric): Secondary | ICD-10-CM | POA: Diagnosis not present

## 2023-07-24 DIAGNOSIS — G43909 Migraine, unspecified, not intractable, without status migrainosus: Secondary | ICD-10-CM | POA: Diagnosis not present

## 2023-07-24 DIAGNOSIS — E871 Hypo-osmolality and hyponatremia: Secondary | ICD-10-CM | POA: Diagnosis not present

## 2023-07-24 DIAGNOSIS — F330 Major depressive disorder, recurrent, mild: Secondary | ICD-10-CM | POA: Diagnosis not present

## 2023-07-24 DIAGNOSIS — I110 Hypertensive heart disease with heart failure: Secondary | ICD-10-CM | POA: Diagnosis not present

## 2023-07-24 DIAGNOSIS — E538 Deficiency of other specified B group vitamins: Secondary | ICD-10-CM | POA: Diagnosis not present

## 2023-07-24 DIAGNOSIS — G629 Polyneuropathy, unspecified: Secondary | ICD-10-CM | POA: Diagnosis not present

## 2023-07-24 DIAGNOSIS — Z87440 Personal history of urinary (tract) infections: Secondary | ICD-10-CM | POA: Diagnosis not present

## 2023-07-24 DIAGNOSIS — M300 Polyarteritis nodosa: Secondary | ICD-10-CM | POA: Diagnosis not present

## 2023-07-24 DIAGNOSIS — K219 Gastro-esophageal reflux disease without esophagitis: Secondary | ICD-10-CM | POA: Diagnosis not present

## 2023-07-24 DIAGNOSIS — K760 Fatty (change of) liver, not elsewhere classified: Secondary | ICD-10-CM | POA: Diagnosis not present

## 2023-07-24 DIAGNOSIS — I872 Venous insufficiency (chronic) (peripheral): Secondary | ICD-10-CM | POA: Diagnosis not present

## 2023-07-24 DIAGNOSIS — E559 Vitamin D deficiency, unspecified: Secondary | ICD-10-CM | POA: Diagnosis not present

## 2023-07-24 DIAGNOSIS — A419 Sepsis, unspecified organism: Secondary | ICD-10-CM | POA: Diagnosis not present

## 2023-07-24 DIAGNOSIS — E785 Hyperlipidemia, unspecified: Secondary | ICD-10-CM | POA: Diagnosis not present

## 2023-07-24 DIAGNOSIS — Z79899 Other long term (current) drug therapy: Secondary | ICD-10-CM | POA: Diagnosis not present

## 2023-07-24 DIAGNOSIS — M4802 Spinal stenosis, cervical region: Secondary | ICD-10-CM | POA: Diagnosis not present

## 2023-07-24 DIAGNOSIS — L400 Psoriasis vulgaris: Secondary | ICD-10-CM | POA: Diagnosis not present

## 2023-07-24 NOTE — Telephone Encounter (Signed)
 Home Health Certification or Plan of Care Tracking  Is this a Certification or Plan of Care? Plan of Care  [facility] Agency: [facility]  Order Number:  N/[id number]  Has charge sheet been attached? Yes  Where has form been placed:   Labeled & placed in provider bin

## 2023-07-24 NOTE — Progress Notes (Signed)
   Erica Barthel, LCSW

## 2023-07-24 NOTE — Telephone Encounter (Signed)
 Home Health Certification or Plan of Care Tracking  Is this a Certification or Plan of Care? Plan of Care  [facility] Agency: [facility]  Order Number:  [id number]  Has charge sheet been attached? Yes  Where has form been placed:   Labeled & placed in provider bin

## 2023-07-24 NOTE — Progress Notes (Signed)
 [facility] Counselor/Therapist Progress Note  Patient ID: [name], MRN: [id number],    Date: 07/24/2023  Time Spent: 10:33am - 11:22am : 49 minutes   Treatment Type: Individual Therapy  Reported Symptoms: Patient reported feelings of anger and guilt at times.   Mental Status Exam: Appearance:  Neat and Well Groomed     Behavior: Appropriate  Motor: Normal  Speech/Language:  Clear and Coherent and Normal Rate  Affect: Appropriate  Mood: normal  Thought process: normal  Thought content:   WNL  Sensory/Perceptual disturbances:   WNL  Orientation: oriented to person, place, and situation  Attention: Good  Concentration: Good  Memory: WNL  Fund of knowledge:  Good  Insight:   Good  Judgment:  Good  Impulse Control: Good   Risk Assessment: Danger to Self:  No Patient denied current suicidal ideation  Self-injurious Behavior: No Danger to Others: No Patient denied current homicidal ideation Duty to Warn:no Physical Aggression / Violence:No  Access to Firearms a concern: No  Gang Involvement:No   Subjective: Patient stated, "been just a little bit rocky" in response to events since last session. Patient reported she was in the hospital for five days since last session due to stomach pain. Patient reported she was prescribed fentanyl while in the hospital and patient reported she experienced auditory hallucinations twice while taking fentanyl. Patient reported while in the hospital she was diagnosed with a UTI and patient reported her electrolytes were imbalanced. Patient stated, "so so" in response to patient's mood since last session. Patient stated, "physically I'm doing pretty and mentally I'm still doing pretty good" in response to patient's current mood. Patient stated, "I want to be a better person, meaning that I come out with a more logical at ease way of thinking" in response to goals for therapy. Patient stated, "to not always feel like everything is my  fault", "to understand everything is not my fault and I don't need to fix it" in response to goals for therapy. Patient stated, "I feel like if I make a decision it's wrong".  Patient stated, "I have always felt less than".   Interventions: Motivational Interviewing and Supportive therapy . Clinician conducted session via caregility video from clinician's home office. Patient provided verbal consent to proceed with telehealth session and is aware of limitations of telephone or video visits. Patient participated in session from patient's home. Reviewed events since last session. Provided supportive therapy, active listening, and validation as patient discussed recent medical concerns and hospitalization. Assessed patient's mood since last session and current mood. Clinician utilized motivational interviewing to explore additional goals for therapy. Clinician utilized a task centered approach in collaboration with patient to develop additional goals for therapy. Patient participated in development of goals and agreed to the following goals for therapy.    Collaboration of Care: Other not required at this time   Diagnosis:  Mild episode of recurrent major depressive disorder (HCC)   Anxiety disorder, unspecified type     Plan: Patient is to utilize [employer] Therapy, thought re-framing, behavioral activation, relaxation techniques, mindfulness and coping strategies to decrease symptoms associated with their diagnosis. Frequency: weekly  Modality: individual      Long-term goal:   Reduce overall level, frequency, and intensity of the feelings of depression and anxiety as evidenced by decreased irritability, sadness, depressed mood, loss of interest, lack of energy, fatigue, increased sleep, decreased concentration, anxiety, anger, ruminating thoughts, and loss of motivation from 7 days/week to 0 to 1 days/week  per patient report for at least 3 consecutive months. Target Date: 07/09/24   Progress: established 07/10/23    Short-term goal:  Decrease feelings of anger towards patient's father Target Date: 01/07/24  Progress: established 07/10/23    Identify triggers for anger/irritability and develop coping strategies to utilize to reduce verbal outbursts per patient's report  Target Date: 01/07/24  Progress: established 07/10/23    Develop and implement effective communication strategies for patient to utilize when expressing her thoughts and feelings to others in a controlled and assertive way  Target Date: 01/07/24  Progress: established 07/10/23    Identify, challenge, and replace negative core beliefs, thought patterns, and negative self talk that contribute to feelings of depression, anxiety, anger, guilt, decision making, and self esteem with positive thoughts, beliefs, and positive self talk per patient's report Target Date: 01/07/24  Progress: established 07/24/23        Doree Barthel, LCSW

## 2023-07-25 ENCOUNTER — Telehealth (HOSPITAL_BASED_OUTPATIENT_CLINIC_OR_DEPARTMENT_OTHER): Payer: Self-pay | Admitting: Pulmonary Disease

## 2023-07-25 ENCOUNTER — Telehealth: Payer: Self-pay | Admitting: Family Medicine

## 2023-07-25 NOTE — Telephone Encounter (Signed)
 Placed in folder at nurse station

## 2023-07-25 NOTE — Telephone Encounter (Signed)
 Type of form received: Power mobility evaluation  Additional comments:   Received by: [employer]  Form should be Faxed/mailed to: (address/ fax #) Fax to [phone]  Is patient requesting call for pickup: N/A  Form placed:  Labeled & placed in provider bin  Attach charge sheet.  Provider will determine charge.  Individual made aware of 3-5 business day turn around? N/A

## 2023-07-25 NOTE — Telephone Encounter (Signed)
 Placed in folder at Nurse station

## 2023-07-26 ENCOUNTER — Telehealth: Admitting: Family Medicine

## 2023-07-26 ENCOUNTER — Other Ambulatory Visit: Payer: Self-pay | Admitting: Family Medicine

## 2023-07-26 ENCOUNTER — Encounter: Payer: Self-pay | Admitting: Family Medicine

## 2023-07-26 ENCOUNTER — Telehealth: Payer: Self-pay

## 2023-07-26 DIAGNOSIS — E785 Hyperlipidemia, unspecified: Secondary | ICD-10-CM | POA: Diagnosis not present

## 2023-07-26 DIAGNOSIS — F1190 Opioid use, unspecified, uncomplicated: Secondary | ICD-10-CM

## 2023-07-26 DIAGNOSIS — I5033 Acute on chronic diastolic (congestive) heart failure: Secondary | ICD-10-CM | POA: Diagnosis not present

## 2023-07-26 DIAGNOSIS — L400 Psoriasis vulgaris: Secondary | ICD-10-CM | POA: Diagnosis not present

## 2023-07-26 DIAGNOSIS — E559 Vitamin D deficiency, unspecified: Secondary | ICD-10-CM | POA: Diagnosis not present

## 2023-07-26 DIAGNOSIS — M797 Fibromyalgia: Secondary | ICD-10-CM | POA: Diagnosis not present

## 2023-07-26 DIAGNOSIS — I110 Hypertensive heart disease with heart failure: Secondary | ICD-10-CM | POA: Diagnosis not present

## 2023-07-26 DIAGNOSIS — N390 Urinary tract infection, site not specified: Secondary | ICD-10-CM | POA: Diagnosis not present

## 2023-07-26 DIAGNOSIS — Z87440 Personal history of urinary (tract) infections: Secondary | ICD-10-CM | POA: Diagnosis not present

## 2023-07-26 DIAGNOSIS — G43909 Migraine, unspecified, not intractable, without status migrainosus: Secondary | ICD-10-CM | POA: Diagnosis not present

## 2023-07-26 DIAGNOSIS — K760 Fatty (change of) liver, not elsewhere classified: Secondary | ICD-10-CM | POA: Diagnosis not present

## 2023-07-26 DIAGNOSIS — G894 Chronic pain syndrome: Secondary | ICD-10-CM | POA: Diagnosis not present

## 2023-07-26 DIAGNOSIS — K56609 Unspecified intestinal obstruction, unspecified as to partial versus complete obstruction: Secondary | ICD-10-CM

## 2023-07-26 DIAGNOSIS — Z79899 Other long term (current) drug therapy: Secondary | ICD-10-CM | POA: Diagnosis not present

## 2023-07-26 DIAGNOSIS — J45901 Unspecified asthma with (acute) exacerbation: Secondary | ICD-10-CM | POA: Diagnosis not present

## 2023-07-26 DIAGNOSIS — M4802 Spinal stenosis, cervical region: Secondary | ICD-10-CM | POA: Diagnosis not present

## 2023-07-26 DIAGNOSIS — M300 Polyarteritis nodosa: Secondary | ICD-10-CM | POA: Diagnosis not present

## 2023-07-26 DIAGNOSIS — G9529 Other cord compression: Secondary | ICD-10-CM | POA: Diagnosis not present

## 2023-07-26 DIAGNOSIS — E538 Deficiency of other specified B group vitamins: Secondary | ICD-10-CM | POA: Diagnosis not present

## 2023-07-26 DIAGNOSIS — I872 Venous insufficiency (chronic) (peripheral): Secondary | ICD-10-CM | POA: Diagnosis not present

## 2023-07-26 DIAGNOSIS — G629 Polyneuropathy, unspecified: Secondary | ICD-10-CM | POA: Diagnosis not present

## 2023-07-26 DIAGNOSIS — K219 Gastro-esophageal reflux disease without esophagitis: Secondary | ICD-10-CM | POA: Diagnosis not present

## 2023-07-26 DIAGNOSIS — Z7951 Long term (current) use of inhaled steroids: Secondary | ICD-10-CM | POA: Diagnosis not present

## 2023-07-26 DIAGNOSIS — G4733 Obstructive sleep apnea (adult) (pediatric): Secondary | ICD-10-CM | POA: Diagnosis not present

## 2023-07-26 NOTE — Telephone Encounter (Signed)
 I called [name]'s insurance company on her behalf to set up transportation for her. They did confirm a multi-trip ride for her when we need it we just need to call and have it set up. I had Marguerette provide her verbal auth to the insurance agent so that I was allowed to initiate the service. Office staff should be the one calling to set up transportation going forward. Please call [phone] to do so.

## 2023-07-26 NOTE — Progress Notes (Unsigned)
 Virtual Visit via Video   I connected with patient on 07/26/23 at 11:00 AM EDT by a video enabled telemedicine application and verified that I am speaking with the correct person using two identifiers.  Location patient: Home Location provider: [facility], Office Persons participating in the virtual visit: Patient, Provider, CMA Archie Patten H)  I discussed the limitations of evaluation and management by telemedicine and the availability of in person appointments. The patient expressed understanding and agreed to proceed.  Subjective:   HPI:   Hospital f/u- pt was admitted 2/26-3/3 after presenting w/ abd pain, confusion, and foul smelling urine.  She was found to have UTI and SBO.  Surgery was consulted and recommended conservative management.  Her bowel function had returned to normal prior to d/c and on 3/2 was started on a regular diet.  They encouraged her to decrease pain meds and muscle relaxers which may be worsening her bowel motility.  Her UTI was tx'd w/ Rocephin while inpt.  D/C summary recommended stopping Clonazepam, Cyclobenzaprine, and Hydrocodone.  Today pt reports she is pain free, able to eat, and having normal BM's.  New hospital bed was delivered on Monday.  ROS:   See pertinent positives and negatives per HPI.  Patient Active Problem List   Diagnosis Date Noted   SBO (small bowel obstruction) (HCC) 07/12/2023   Asthma, chronic, unspecified asthma severity, with acute exacerbation 01/12/2023   Hypomagnesemia 01/12/2023   Bedbound 12/14/2022   Acute on chronic heart failure with preserved ejection fraction (HFpEF) (HCC) 11/23/2022   Pressure injury of skin 11/23/2022   Nocturnal hypoxemia 06/21/2022   Elevated LFTs 05/06/2022   Obesity hypoventilation syndrome (HCC) 05/06/2022   Acute respiratory failure with hypoxia (HCC) 04/22/2022   Hypokalemia 04/22/2022   Leg cramps 01/12/2022   Fever of unknown origin 08/12/2021   Sacral pressure sore  07/06/2021   Vitamin B6 deficiency 06/08/2021   Myelomalacia of cervical cord (HCC) 06/06/2021   Compressive cervical cord myelomalacia (HCC) 06/05/2021   B12 deficiency 06/05/2021   Folate deficiency 06/05/2021   Vitamin D deficiency 06/05/2021   Leg weakness, bilateral 06/04/2021   Cervical atypia 01/06/2021   Steatosis of liver 01/06/2021   Hyponatremia 07/27/2019   Urinary urgency 04/04/2018   Chronic narcotic use 09/07/2016   Acute blood loss anemia 01/14/2016   Multiple gastric ulcers    PAN (polyarteritis nodosa) (HCC) 11/24/2015   GERD (gastroesophageal reflux disease) 01/08/2014   Routine general medical examination at a health care facility 04/23/2013   Bariatric surgery status 10/31/2012   S/P skin biopsy 10/31/2012   Psoriasis 01/09/2012   DDD (degenerative disc disease), lumbar 11/30/2011   Migraine headache    [name] (obstructive sleep apnea) 11/16/2010   HYPERGLYCEMIA, FASTING 10/08/2009   CONTRACTURE OF TENDON 08/17/2009   PARESTHESIA, HANDS 12/23/2008   Leg pain, right 11/05/2008   ADVERSE DRUG REACTION 04/03/2008   PERIPHERAL EDEMA 03/26/2008   Obesity, morbid, BMI 50 or higher (HCC) 01/03/2007   Cellulitis 01/03/2007   Hyperlipidemia 09/22/2006   Fibromyalgia 09/20/2006   Anxiety and depression 06/28/2006   Essential hypertension 06/28/2006    Social History   Tobacco Use   Smoking status: Never   Smokeless tobacco: Never  Substance Use Topics   Alcohol use: No    Current Outpatient Medications:    albuterol (VENTOLIN HFA) 108 (90 Base) MCG/ACT inhaler, Inhale 1 puff into the lungs every 6 (six) hours as needed for wheezing or shortness of breath., Disp: , Rfl:    ALPRAZolam (  XANAX) 1 MG tablet, TAKE 1 TABLET BY MOUTH 2 TIMES DAILY, Disp: 60 tablet, Rfl: 3   ARIPiprazole (ABILIFY) 15 MG tablet, TAKE 1 TABLET BY MOUTH EVERY DAY, Disp: 90 tablet, Rfl: 1   Armodafinil 250 MG tablet, Take 1 tablet (250 mg total) by mouth in the morning and at bedtime.,  Disp: 60 tablet, Rfl: 3   Biotin 10 MG TABS, Take 1 tablet (10 mg total) by mouth in the morning., Disp: 90 tablet, Rfl: 1   buPROPion (WELLBUTRIN XL) 300 MG 24 hr tablet, TAKE 1 TABLET BY MOUTH EVERY DAY, Disp: 30 tablet, Rfl: 2   calcium citrate (CALCITRATE - DOSED IN MG ELEMENTAL CALCIUM) 950 (200 Ca) MG tablet, Take 1 tablet (200 mg of elemental calcium total) by mouth daily., Disp: 90 tablet, Rfl: 1   cyanocobalamin (VITAMIN B12) 1000 MCG tablet, TAKE 1 TABLET BY MOUTH ONCE DAILY, Disp: 30 tablet, Rfl: 2   diclofenac Sodium (VOLTAREN) 1 % GEL, Apply 4 g topically 4 (four) times daily., Disp: 1 g, Rfl: 1   DULoxetine (CYMBALTA) 60 MG capsule, Take 2 capsules (120 mg total) by mouth daily. (Patient taking differently: Take 60 mg by mouth 2 (two) times daily.), Disp: 240 capsule, Rfl: 2   FEROSUL 325 (65 Fe) MG tablet, TAKE 1 TABLET BY MOUTH EVERY MORNING WITH BREAKFAST, Disp: 30 tablet, Rfl: 2   fluticasone-salmeterol (ADVAIR) 250-50 MCG/ACT AEPB, Inhale 1 puff into the lungs every 12 (twelve) hours., Disp: 60 each, Rfl: 11   folic acid (FOLVITE) 1 MG tablet, TAKE 1 TABLET BY MOUTH ONCE DAILY, Disp: 30 tablet, Rfl: 2   furosemide (LASIX) 40 MG tablet, Take 1 tablet by mouth daily. May take an additional tablet by mouth IF weight gain of 5 lbs and CALL MD for further instructions., Disp: 90 tablet, Rfl: 1   gabapentin (NEURONTIN) 600 MG tablet, Take 1 tablet (600 mg total) by mouth 2 (two) times daily as needed. Pt takes daily - can take up to twice daily, Disp: 60 tablet, Rfl: 1   GNP VITAMIN B-1 100 MG tablet, TAKE 1 TABLET BY MOUTH EVERY DAY, Disp: 30 tablet, Rfl: 2   ipratropium-albuterol (DUONEB) 0.5-2.5 (3) MG/3ML SOLN, Take 3 mLs by nebulization every 6 (six) hours as needed (wheezing/shortness of breath)., Disp: 360 mL, Rfl: 0   magnesium oxide (MAG-OX) 400 (240 Mg) MG tablet, Take 1 tablet by mouth daily., Disp: , Rfl:    nebivolol (BYSTOLIC) 10 MG tablet, TAKE 1 TABLET BY MOUTH EVERY DAY,  Disp: 30 tablet, Rfl: 2   nystatin (MYCOSTATIN/NYSTOP) powder, APPLY 1 APPLICATION TOPICALLY 3 TIMES DAILY (Patient taking differently: Apply 1 Application topically 3 (three) times daily as needed (yeast infection).), Disp: 60 g, Rfl: 3   Oxcarbazepine (TRILEPTAL) 300 MG tablet, Take 300 mg by mouth 2 (two) times daily., Disp: , Rfl:    pantoprazole (PROTONIX) 40 MG tablet, TAKE 1 TABLET BY MOUTH 2 TIMES DAILY, Disp: 60 tablet, Rfl: 2   phentermine 37.5 MG capsule, Take 1 capsule (37.5 mg total) by mouth every morning., Disp: 90 capsule, Rfl: 0   potassium chloride (KLOR-CON) 20 MEQ packet, Take 1 packet by mouth daily. Take with lasix., Disp: 30 packet, Rfl: 1   promethazine (PHENERGAN) 25 MG tablet, TAKE 1 TABLET BY MOUTH EVERY 6 HOURS AS NEEDED FOR NAUSEA AND VOMITING, Disp: 45 tablet, Rfl: 3   promethazine-dextromethorphan (PROMETHAZINE-DM) 6.25-15 MG/5ML syrup, Take 5 mLs by mouth 4 (four) times daily as needed. (Patient taking differently: Take  5 mLs by mouth 4 (four) times daily as needed for cough.), Disp: 180 mL, Rfl: 0   pyridOXINE (VITAMIN B6) 100 MG tablet, TAKE 1 TABLET BY MOUTH ONCE DAILY, Disp: 30 tablet, Rfl: 2  Allergies  Allergen Reactions   Erythromycin Anaphylaxis   Niacin Anaphylaxis, Shortness Of Breath and Swelling    Swelling and "problems breathing"   Sulfamethoxazole-Trimethoprim Anaphylaxis, Swelling, Rash and Other (See Comments)    Throat closed and the eyes became swollen   Aspirin Hives   Benzoin Compound Rash   Cephalexin Hives    Tolerated ceftriaxone and cefepime 07/2019   Iohexol Hives    Pt treated with PO benedryl   Lisinopril Hives   Naproxen Hives   Pnu-Imune [Pneumococcal Polysaccharide Vaccine] Rash   Sulfonamide Derivatives Rash    Objective:   There were no vitals taken for this visit. AAOx3, NAD Obese, lying in bed NCAT, EOMI No obvious CN deficits Coloring WNL Pt is able to speak clearly, coherently without shortness of breath or  increased work of breathing.  Thought process is linear.  Mood is appropriate.   Assessment and Plan:   SBO- resolved.  Was treated w/ conservative measures.  Pt reports she is eating and drinking normally and having normal BM's.  UTI- resolved.  Pt was treated w/ Rocephin while in the hospital.  She states she is currently asymptomatic.  Will need to monitor closely as this is a recurrent issue for pt.  Chronic narcotic use- pt was advised at D/C to stop benzos, pain meds, and muscle relaxers but pt has been on these for many years- many decades- and weaning them/stopping them would be incredibly difficult at this time.  And given her living situation- lives alone w/ little assistance- I would fear withdrawal or seizures.  No changes to meds at this time.   Neena Rhymes, MD 07/26/2023

## 2023-07-27 ENCOUNTER — Telehealth: Payer: Self-pay

## 2023-07-27 ENCOUNTER — Other Ambulatory Visit: Payer: Self-pay | Admitting: *Deleted

## 2023-07-27 DIAGNOSIS — L400 Psoriasis vulgaris: Secondary | ICD-10-CM | POA: Diagnosis not present

## 2023-07-27 DIAGNOSIS — Z87440 Personal history of urinary (tract) infections: Secondary | ICD-10-CM | POA: Diagnosis not present

## 2023-07-27 DIAGNOSIS — G4733 Obstructive sleep apnea (adult) (pediatric): Secondary | ICD-10-CM | POA: Diagnosis not present

## 2023-07-27 DIAGNOSIS — I5033 Acute on chronic diastolic (congestive) heart failure: Secondary | ICD-10-CM | POA: Diagnosis not present

## 2023-07-27 DIAGNOSIS — Z79899 Other long term (current) drug therapy: Secondary | ICD-10-CM | POA: Diagnosis not present

## 2023-07-27 DIAGNOSIS — M4802 Spinal stenosis, cervical region: Secondary | ICD-10-CM | POA: Diagnosis not present

## 2023-07-27 DIAGNOSIS — E559 Vitamin D deficiency, unspecified: Secondary | ICD-10-CM | POA: Diagnosis not present

## 2023-07-27 DIAGNOSIS — E785 Hyperlipidemia, unspecified: Secondary | ICD-10-CM | POA: Diagnosis not present

## 2023-07-27 DIAGNOSIS — M300 Polyarteritis nodosa: Secondary | ICD-10-CM | POA: Diagnosis not present

## 2023-07-27 DIAGNOSIS — G43909 Migraine, unspecified, not intractable, without status migrainosus: Secondary | ICD-10-CM | POA: Diagnosis not present

## 2023-07-27 DIAGNOSIS — G629 Polyneuropathy, unspecified: Secondary | ICD-10-CM | POA: Diagnosis not present

## 2023-07-27 DIAGNOSIS — I872 Venous insufficiency (chronic) (peripheral): Secondary | ICD-10-CM | POA: Diagnosis not present

## 2023-07-27 DIAGNOSIS — K219 Gastro-esophageal reflux disease without esophagitis: Secondary | ICD-10-CM | POA: Diagnosis not present

## 2023-07-27 DIAGNOSIS — K760 Fatty (change of) liver, not elsewhere classified: Secondary | ICD-10-CM | POA: Diagnosis not present

## 2023-07-27 DIAGNOSIS — J45901 Unspecified asthma with (acute) exacerbation: Secondary | ICD-10-CM | POA: Diagnosis not present

## 2023-07-27 DIAGNOSIS — Z7951 Long term (current) use of inhaled steroids: Secondary | ICD-10-CM | POA: Diagnosis not present

## 2023-07-27 DIAGNOSIS — E538 Deficiency of other specified B group vitamins: Secondary | ICD-10-CM | POA: Diagnosis not present

## 2023-07-27 DIAGNOSIS — G894 Chronic pain syndrome: Secondary | ICD-10-CM | POA: Diagnosis not present

## 2023-07-27 DIAGNOSIS — G9529 Other cord compression: Secondary | ICD-10-CM | POA: Diagnosis not present

## 2023-07-27 DIAGNOSIS — I110 Hypertensive heart disease with heart failure: Secondary | ICD-10-CM | POA: Diagnosis not present

## 2023-07-27 DIAGNOSIS — M797 Fibromyalgia: Secondary | ICD-10-CM | POA: Diagnosis not present

## 2023-07-27 NOTE — Patient Outreach (Signed)
 Care Management  Transitions of Care Program Transitions of Care Post-discharge week 2/ day # 8   07/27/2023 Name: [name] MRN: [id number] DOB: [birth year]  Subjective: [name] is a 53 y.o. year old female who is a primary care patient of [name], Helane Rima, MD. The Care Management team Engaged with patient Engaged with patient by telephone to assess and address transitions of care needs.   Consent to Services:  Patient was given information about care management services, agreed to services, and gave verbal consent to participate.   Enrolled into TOC 30-day program:  07/19/23  Assessment: "I am doing better- the area on my bottom is now completely healed up- the nurse came today and said it is no longer there; the nurse also drew blood and took my urine sample.  Dr. Beverely Low told me that the urine infection was cleared up before the dent me home from the hospital, so now she doesn't think I need to see the ID doctor sooner.  I got the bariatric hospital bed, and I like it.  I am still working with the medicaid people to stay on them for personal care services-- they told me earlier this week, I am number 9 on the waiting list"  Denies clinical concerns and sounds to be in no distress throughout [facility] 30-day program outreach call today          SDOH Interventions    Flowsheet Row Telephone from 07/19/2023 in [facility] Telephone from 01/17/2023 in [facility] Coordination Video Visit from 01/09/2023 in [facility] Telephone from 11/25/2022 in [facility] Community Care Coordination Clinical Support from 11/02/2022 in [facility] [employer] from 06/01/2022 in [facility] Coordination  SDOH Interventions        Food Insecurity Interventions Intervention Not Indicated Intervention Not Indicated -- Intervention Not  Indicated Intervention Not Indicated Intervention Not Indicated  Housing Interventions Intervention Not Indicated  [lives in single family home with her adult son] -- -- -- Intervention Not Indicated Intervention Not Indicated  Transportation Interventions Intervention Not Indicated  [established with / uses [facility] regularly- at baseline] Intervention Not Indicated  [pt confirms she uses [facility]] -- Intervention Not Indicated  [pt states she uses [facility]] Intervention Not Indicated Intervention Not Indicated  Utilities Interventions Intervention Not Indicated -- -- -- Intervention Not Indicated Intervention Not Indicated  Alcohol Usage Interventions -- -- -- -- Intervention Not Indicated (Score <7) --  Depression Interventions/Treatment  -- -- Medication, Currently on Treatment -- -- --  Financial Strain Interventions -- -- -- -- Intervention Not Indicated --  Physical Activity Interventions -- -- -- -- Intervention Not Indicated --  Stress Interventions -- -- -- -- Intervention Not Indicated --  Social Connections Interventions -- -- -- -- Intervention Not Indicated --        Goals Addressed             This Visit's Progress    TOC 30-day Program Care Plan   On track    Current Barriers:  Medication management confirms uses compliance packaging; son assists with all aspects of medication management Provider appointments 07/26/23- PCP; 08/02/23- pulmonary provider; 08/07/23- ID provider (new patient- for resistant bacteria in urine noted on 07/18/23); 08/09/23- cardiology provider- new patient appointment Home Health services reports has been established with [facility] "for several years now" Equipment/DME see DME notes in [facility] template:  patient currently has pending orders through [employer] for: bariatric hospital bed; electric wheelchair; also reports has new order for new CPAP machine- reports expects CPAP machine to "arrive any day now;" reports she is using a friends "borrowed  hover-round" currently, prn for assistance with mobility; reports difficulty getting her current standard wheelchair through door frames in her home Established with [facility] for transportation services Fragile state of health, multiple progressing chronic health conditions- reports has been bed-bound since "2021;" reports "doctors are not sure what happened to cause me to become the way I am--- I used to be completely independent until I got sick in 2021" Total care patient: has applied for medicaid/ CAPS program: reports spoke to her caseworker yesterday, who told her, "you are number 13 on our list- so services will be starting sooner than later;" confirms she has contact information for medicaid/ CAPS referral caseworker- confirms she stays in touch with case worker "regularly" 07/27/23: Patient tells me she has maintained communication with medicaid "people;" but reports that she is frustrated due to "talking to different people all the time;" encouraged her to obtain a point-person/ contact information to avoid getting conflicting information- she will do this; states that she was told "just this week that I am now number 9 on the wait list" 3 inpatient hospitalizations x last 12 months:  at time of last hospital discharge on 07/17/23--- PCP messaged patient on 07/18/23 that her urine culture resulted with a resistant bacteria- PCP made referral for ID provider, given patient's multiple documented allergies to antibiotics:  verified the appointment is approximately 3 weeks away-- on 08/07/23-- will message PCP to make her aware of same 07/27/23: reports today that during her virtual office visit with PCP on 07/26/23, PCP advised that urgent ID appointment is not indicated: PCP reported to patient that the infection that "caused" the hospitalization was "cleared up" during the hospital visit; patient reports PCP advised to still see the ID provider as scheduled on 08/07/23- but "no need" for antibiotics  "now" Reports has "small, dime-sized" open bedsore starting on "the right side of my butt;" need to ensure proper ongoing skin care/ assessments by home health team, currently established in patient's care 07/27/23:  patient reports area "is now completely healed;" reports this was verified by home health RN who visited patient's home today: confirmed home health nurse also drew blood and obtained urine for lab testing as per PCP orders for same 07/27/23:  DME update- confirmed bariatric hospital bed was delivered on Monday 07/24/23; confirmed patient also continuing to regularly communicate with PCP re: need for appropriate motorized wheelchair according to the limitations of her home- this remains pending as of today but review of EHR clearly indicates that PCP team is actively working on this; patient clarified today that her prior report that CPAP was to be delivered is actually what sounds like a in-home sleep study- she confirms plans to attend pulmonary provider office visit next week as scheduled on 08/02/23  RNCM Clinical Goal(s):  Patient will work with the Care Management team over the next 30 days to address Transition of Care Barriers: Support at home Home Health services Equipment/DME Need for ongoing support around clinical condition/ care at home in total care patient take all medications exactly as prescribed and will call provider for medication related questions as evidenced by review of same with patient and caregiver during weekly TOC 30-day program outreach calls attend all scheduled medical appointments: as noted above as evidenced by review of same with  patient and caregiver- son during [facility] 30-day program weekly outreach calls  through collaboration with Medical illustrator, provider, and care team.   Interventions: Evaluation of current treatment plan related to  self management and patient's adherence to plan as established by provider  Transitions of Care:  Goal on track:  Yes.  07/27/23 Durable Medical Equipment (DME) needs assessed with patient/caregiver Doctor Visits  - discussed the importance of doctor visits Post discharge activity limitations prescribed by provider reviewed Reviewed Signs and symptoms of infection Reinforced previously provided education around action plan is signs/ symptoms UTI (or other source) infection arise Confirmed continues using home O2- continues using "all the time" Reviewed provider office visits: virtual visit 07/26/23- PCP; Behavioral Health counseling visit on 07/24/23- confirmed no medication changes post- visits Confirmed no changes to medications post- PCP office visit yesterday; continues using compliance packaging: again report very good/ 100% adherence to medication regimen  Reviewed multiple upcoming provider office visits as above: confirmed patient is aware of all and has plans to attend as scheduled 08/02/23- pulmonary provider 08/07/23- ID provider 08/08/23- cardiology provider Today-- patient tells me that her PCP office visit yesterday had to be changed to virtual visit, due to "[facility] no longer going to Dr. Rennis Golden office;" states that [facility] will continue to provide transportation to all other provider offices; she reports that she has since contacted her insurance provider who confirmed they will provide transportation to PCP office moving forward: it appears that PCP office team is also assisting with ensuring that insurance provider will provide transportation to PCP office visits moving forward Re- provided my direct contact information should questions/ concerns/ needs arise prior to next TOC 30-day program RN CM telephone visit Update on medicaid PCS waiting list obtained from patient as above: she states that she is frustrated because "different people there tell me different things;" she verbalizes plans to re-contact them to obtain a specific case-worker's name so she can "talk to the same person" when she contacted  them  Progression of TOC 30-day program initial assessment today   Patient Goals/Self-Care Activities: Participate in Transition of Care Program/Attend TOC scheduled calls Take all medications as prescribed Attend all scheduled provider appointments Call provider office for new concerns or questions  Continue working with the home health team that is involved in your care Stay in touch with the medicaid team you have been working with, to determine the status of your request for personal care services Please continue regularly monitoring and caring for your skin condition- make sure that the home health nurse evaluates your bottom and your skin with every home visit Continue using home oxygen as prescribed Use assistive devices as needed to prevent falls If you believe your condition is getting worse- contact your care providers (doctors) promptly- reaching out to your doctor early when you have concerns can prevent you from having to go to the hospital  Follow Up Plan:  Telephone follow up appointment with care management team member scheduled for:  Thursday 08/03/23 at 2:00 pm    Plan for next week call: Review pulmonary provider office visit on 08/02/23/ status of plan for obtaining CPAP Get update on home health services- ensure skin assessment with every home visit Update on pending DME: motorized wheelchair Update on medicaid PCS status- caseworker: refer LCSW as indicated Complete TOC 30-day program initial assessment       Plan: Telephone follow up appointment with care management team member scheduled for:   Thursday 08/03/23 at 2:00 pm  Total time spent from review to signing of note/ including any care coordination interventions:  78 minutes; complex patient/ case with complex care plan  Pls call/ message for questions,  Caryl Pina, RN, BSN, CCRN Alumnus RN Care Manager  Transitions of Care  [facility] - [facility] [phone]: direct  office

## 2023-07-27 NOTE — Telephone Encounter (Signed)
 Copied from CRM [id number]. Topic: General - Call Back - No Documentation >> Jul 27, 2023  9:42 AM [name] wrote: Reason for CRM: Patient wanting an update on missed call.

## 2023-07-27 NOTE — Telephone Encounter (Signed)
 Copied from CRM [id number]. Topic: Clinical - Request for Lab/Test Order >> Jul 27, 2023  1:06 PM Marica Otter wrote: Reason for CRM: [name] wants to know if Thea Silversmith can add a magnesium level test to lab order, [name] states per patient request.  [name] [phone]

## 2023-07-27 NOTE — Telephone Encounter (Signed)
 Ok to add Mag level

## 2023-07-27 NOTE — Patient Instructions (Signed)
 Visit Information  Thank you for taking time to visit with me today. Please don't hesitate to contact me if I can be of assistance to you before our next scheduled telephone appointment.  Our next appointment is by telephone on Thursday 08/03/23 at 2:00 pm  Please call the care guide team at [phone] if you need to cancel or reschedule your appointment.   Following are the goals we discussed today:  Patient Goals/Self-Care Activities: Participate in Transition of Care Program/Attend TOC scheduled calls Take all medications as prescribed Attend all scheduled provider appointments Call provider office for new concerns or questions  Continue working with the home health team that is involved in your care Stay in touch with the medicaid team you have been working with, to determine the status of your request for personal care services Please continue regularly monitoring and caring for your skin condition- make sure that the home health nurse evaluates your bottom and your skin with every home visit Continue using home oxygen as prescribed Use assistive devices as needed to prevent falls If you believe your condition is getting worse- contact your care providers (doctors) promptly- reaching out to your doctor early when you have concerns can prevent you from having to go to the hospital  If you are experiencing a Mental Health or Behavioral Health Crisis or need someone to talk to, please  call the Suicide and Crisis Lifeline: 988 call the [place] National Suicide Prevention Lifeline: 320-369-4283 or TTY: 9011118268 TTY (323)833-6817) to talk to a trained counselor call 1-800-273-TALK (toll free, 24 hour hotline) go to [facility] [address] [place] [phone]) call the [facility] Crisis Line: (603) [phone] call 911   Patient verbalizes understanding of instructions and care plan provided today and agrees to view in MyChart. Active  MyChart status and patient understanding of how to access instructions and care plan via MyChart confirmed with patient.     Pls call/ message for questions,  Caryl Pina, RN, BSN, CCRN Alumnus RN Care Manager  Transitions of Care  VBCI - [facility] [phone]: direct office

## 2023-07-27 NOTE — Telephone Encounter (Signed)
 Patient notes home health nurse is visiting today at 12 asking if she should have blood and urine tests performed while she is there  Phone number [phone]    Dr Beverely Low reviewed and I gave verbal go ahead for CBC and CMP did then relay this via Phone to [facility] nurse

## 2023-07-27 NOTE — Telephone Encounter (Signed)
 Called [name] a verbalized the add on for mag level. [name] stated she will get that order added on.

## 2023-07-28 ENCOUNTER — Telehealth: Payer: Self-pay

## 2023-07-28 LAB — LAB REPORT - SCANNED: EGFR: 107

## 2023-07-28 NOTE — Telephone Encounter (Signed)
 Pt would need to call [employer] to get it scheduled [phone]

## 2023-07-28 NOTE — Telephone Encounter (Signed)
 Pt just wanted to know the dosing on her medication and how often she was supposed to use her inhaler. Directions given to patient and she verbalized understanding.

## 2023-07-29 DIAGNOSIS — G894 Chronic pain syndrome: Secondary | ICD-10-CM | POA: Diagnosis not present

## 2023-07-29 DIAGNOSIS — G9529 Other cord compression: Secondary | ICD-10-CM | POA: Diagnosis not present

## 2023-07-29 DIAGNOSIS — I872 Venous insufficiency (chronic) (peripheral): Secondary | ICD-10-CM | POA: Diagnosis not present

## 2023-07-29 DIAGNOSIS — J45901 Unspecified asthma with (acute) exacerbation: Secondary | ICD-10-CM | POA: Diagnosis not present

## 2023-07-29 DIAGNOSIS — E559 Vitamin D deficiency, unspecified: Secondary | ICD-10-CM | POA: Diagnosis not present

## 2023-07-29 DIAGNOSIS — G629 Polyneuropathy, unspecified: Secondary | ICD-10-CM | POA: Diagnosis not present

## 2023-07-29 DIAGNOSIS — Z7951 Long term (current) use of inhaled steroids: Secondary | ICD-10-CM | POA: Diagnosis not present

## 2023-07-29 DIAGNOSIS — G4733 Obstructive sleep apnea (adult) (pediatric): Secondary | ICD-10-CM | POA: Diagnosis not present

## 2023-07-29 DIAGNOSIS — K219 Gastro-esophageal reflux disease without esophagitis: Secondary | ICD-10-CM | POA: Diagnosis not present

## 2023-07-29 DIAGNOSIS — E785 Hyperlipidemia, unspecified: Secondary | ICD-10-CM | POA: Diagnosis not present

## 2023-07-29 DIAGNOSIS — G43909 Migraine, unspecified, not intractable, without status migrainosus: Secondary | ICD-10-CM | POA: Diagnosis not present

## 2023-07-29 DIAGNOSIS — L400 Psoriasis vulgaris: Secondary | ICD-10-CM | POA: Diagnosis not present

## 2023-07-29 DIAGNOSIS — I110 Hypertensive heart disease with heart failure: Secondary | ICD-10-CM | POA: Diagnosis not present

## 2023-07-29 DIAGNOSIS — Z79899 Other long term (current) drug therapy: Secondary | ICD-10-CM | POA: Diagnosis not present

## 2023-07-29 DIAGNOSIS — M4802 Spinal stenosis, cervical region: Secondary | ICD-10-CM | POA: Diagnosis not present

## 2023-07-29 DIAGNOSIS — Z87440 Personal history of urinary (tract) infections: Secondary | ICD-10-CM | POA: Diagnosis not present

## 2023-07-29 DIAGNOSIS — I5033 Acute on chronic diastolic (congestive) heart failure: Secondary | ICD-10-CM | POA: Diagnosis not present

## 2023-07-29 DIAGNOSIS — M797 Fibromyalgia: Secondary | ICD-10-CM | POA: Diagnosis not present

## 2023-07-29 DIAGNOSIS — K760 Fatty (change of) liver, not elsewhere classified: Secondary | ICD-10-CM | POA: Diagnosis not present

## 2023-07-29 DIAGNOSIS — M300 Polyarteritis nodosa: Secondary | ICD-10-CM | POA: Diagnosis not present

## 2023-07-29 DIAGNOSIS — E538 Deficiency of other specified B group vitamins: Secondary | ICD-10-CM | POA: Diagnosis not present

## 2023-07-30 DIAGNOSIS — M797 Fibromyalgia: Secondary | ICD-10-CM | POA: Diagnosis not present

## 2023-07-30 DIAGNOSIS — M4802 Spinal stenosis, cervical region: Secondary | ICD-10-CM | POA: Diagnosis not present

## 2023-07-30 DIAGNOSIS — L400 Psoriasis vulgaris: Secondary | ICD-10-CM | POA: Diagnosis not present

## 2023-07-30 DIAGNOSIS — M300 Polyarteritis nodosa: Secondary | ICD-10-CM | POA: Diagnosis not present

## 2023-07-30 DIAGNOSIS — Z79899 Other long term (current) drug therapy: Secondary | ICD-10-CM | POA: Diagnosis not present

## 2023-07-30 DIAGNOSIS — I110 Hypertensive heart disease with heart failure: Secondary | ICD-10-CM | POA: Diagnosis not present

## 2023-07-30 DIAGNOSIS — Z87440 Personal history of urinary (tract) infections: Secondary | ICD-10-CM | POA: Diagnosis not present

## 2023-07-30 DIAGNOSIS — I872 Venous insufficiency (chronic) (peripheral): Secondary | ICD-10-CM | POA: Diagnosis not present

## 2023-07-30 DIAGNOSIS — G629 Polyneuropathy, unspecified: Secondary | ICD-10-CM | POA: Diagnosis not present

## 2023-07-30 DIAGNOSIS — K760 Fatty (change of) liver, not elsewhere classified: Secondary | ICD-10-CM | POA: Diagnosis not present

## 2023-07-30 DIAGNOSIS — E538 Deficiency of other specified B group vitamins: Secondary | ICD-10-CM | POA: Diagnosis not present

## 2023-07-30 DIAGNOSIS — E559 Vitamin D deficiency, unspecified: Secondary | ICD-10-CM | POA: Diagnosis not present

## 2023-07-30 DIAGNOSIS — J45901 Unspecified asthma with (acute) exacerbation: Secondary | ICD-10-CM | POA: Diagnosis not present

## 2023-07-30 DIAGNOSIS — K219 Gastro-esophageal reflux disease without esophagitis: Secondary | ICD-10-CM | POA: Diagnosis not present

## 2023-07-30 DIAGNOSIS — Z7951 Long term (current) use of inhaled steroids: Secondary | ICD-10-CM | POA: Diagnosis not present

## 2023-07-30 DIAGNOSIS — I5033 Acute on chronic diastolic (congestive) heart failure: Secondary | ICD-10-CM | POA: Diagnosis not present

## 2023-07-30 DIAGNOSIS — G43909 Migraine, unspecified, not intractable, without status migrainosus: Secondary | ICD-10-CM | POA: Diagnosis not present

## 2023-07-30 DIAGNOSIS — G9529 Other cord compression: Secondary | ICD-10-CM | POA: Diagnosis not present

## 2023-07-30 DIAGNOSIS — G4733 Obstructive sleep apnea (adult) (pediatric): Secondary | ICD-10-CM | POA: Diagnosis not present

## 2023-07-30 DIAGNOSIS — E785 Hyperlipidemia, unspecified: Secondary | ICD-10-CM | POA: Diagnosis not present

## 2023-07-30 DIAGNOSIS — G894 Chronic pain syndrome: Secondary | ICD-10-CM | POA: Diagnosis not present

## 2023-07-31 ENCOUNTER — Other Ambulatory Visit: Payer: Self-pay | Admitting: Family Medicine

## 2023-07-31 ENCOUNTER — Telehealth: Payer: Self-pay | Admitting: Family Medicine

## 2023-07-31 DIAGNOSIS — G9589 Other specified diseases of spinal cord: Secondary | ICD-10-CM

## 2023-07-31 NOTE — Telephone Encounter (Signed)
 Home Health Certification or Plan of Care Tracking   Is this a Certification or Plan of Care? Plan of Care   [facility] Agency: [facility]   Order Number:     Has charge sheet been attached? Yes   Where has form been placed:   Labeled & placed in provider bin

## 2023-08-01 DIAGNOSIS — N390 Urinary tract infection, site not specified: Secondary | ICD-10-CM | POA: Diagnosis not present

## 2023-08-01 DIAGNOSIS — G894 Chronic pain syndrome: Secondary | ICD-10-CM | POA: Diagnosis not present

## 2023-08-01 DIAGNOSIS — G43909 Migraine, unspecified, not intractable, without status migrainosus: Secondary | ICD-10-CM | POA: Diagnosis not present

## 2023-08-01 DIAGNOSIS — E538 Deficiency of other specified B group vitamins: Secondary | ICD-10-CM | POA: Diagnosis not present

## 2023-08-01 DIAGNOSIS — K760 Fatty (change of) liver, not elsewhere classified: Secondary | ICD-10-CM | POA: Diagnosis not present

## 2023-08-01 DIAGNOSIS — L400 Psoriasis vulgaris: Secondary | ICD-10-CM | POA: Diagnosis not present

## 2023-08-01 DIAGNOSIS — M797 Fibromyalgia: Secondary | ICD-10-CM | POA: Diagnosis not present

## 2023-08-01 DIAGNOSIS — K219 Gastro-esophageal reflux disease without esophagitis: Secondary | ICD-10-CM | POA: Diagnosis not present

## 2023-08-01 DIAGNOSIS — M4802 Spinal stenosis, cervical region: Secondary | ICD-10-CM | POA: Diagnosis not present

## 2023-08-01 DIAGNOSIS — G4733 Obstructive sleep apnea (adult) (pediatric): Secondary | ICD-10-CM | POA: Diagnosis not present

## 2023-08-01 DIAGNOSIS — G629 Polyneuropathy, unspecified: Secondary | ICD-10-CM | POA: Diagnosis not present

## 2023-08-01 DIAGNOSIS — K56609 Unspecified intestinal obstruction, unspecified as to partial versus complete obstruction: Secondary | ICD-10-CM | POA: Diagnosis not present

## 2023-08-01 DIAGNOSIS — J45909 Unspecified asthma, uncomplicated: Secondary | ICD-10-CM | POA: Diagnosis not present

## 2023-08-01 DIAGNOSIS — E559 Vitamin D deficiency, unspecified: Secondary | ICD-10-CM | POA: Diagnosis not present

## 2023-08-01 DIAGNOSIS — E876 Hypokalemia: Secondary | ICD-10-CM | POA: Diagnosis not present

## 2023-08-01 DIAGNOSIS — I872 Venous insufficiency (chronic) (peripheral): Secondary | ICD-10-CM | POA: Diagnosis not present

## 2023-08-01 DIAGNOSIS — E785 Hyperlipidemia, unspecified: Secondary | ICD-10-CM | POA: Diagnosis not present

## 2023-08-01 DIAGNOSIS — G9529 Other cord compression: Secondary | ICD-10-CM | POA: Diagnosis not present

## 2023-08-01 DIAGNOSIS — M300 Polyarteritis nodosa: Secondary | ICD-10-CM | POA: Diagnosis not present

## 2023-08-01 DIAGNOSIS — I110 Hypertensive heart disease with heart failure: Secondary | ICD-10-CM | POA: Diagnosis not present

## 2023-08-01 DIAGNOSIS — I5033 Acute on chronic diastolic (congestive) heart failure: Secondary | ICD-10-CM | POA: Diagnosis not present

## 2023-08-01 NOTE — Telephone Encounter (Signed)
 Faxed and placed in scan bin

## 2023-08-01 NOTE — Telephone Encounter (Signed)
 Form completed and returned to [place]

## 2023-08-01 NOTE — Telephone Encounter (Signed)
 Looks like this was Discontinued by Dr Susa Raring but I am not able to refuse this medication can you please decline refill

## 2023-08-02 ENCOUNTER — Ambulatory Visit (HOSPITAL_BASED_OUTPATIENT_CLINIC_OR_DEPARTMENT_OTHER): Payer: 59 | Admitting: Pulmonary Disease

## 2023-08-03 ENCOUNTER — Other Ambulatory Visit: Payer: Self-pay | Admitting: *Deleted

## 2023-08-03 DIAGNOSIS — I110 Hypertensive heart disease with heart failure: Secondary | ICD-10-CM | POA: Diagnosis not present

## 2023-08-03 DIAGNOSIS — L400 Psoriasis vulgaris: Secondary | ICD-10-CM | POA: Diagnosis not present

## 2023-08-03 DIAGNOSIS — K760 Fatty (change of) liver, not elsewhere classified: Secondary | ICD-10-CM | POA: Diagnosis not present

## 2023-08-03 DIAGNOSIS — N390 Urinary tract infection, site not specified: Secondary | ICD-10-CM | POA: Diagnosis not present

## 2023-08-03 DIAGNOSIS — M797 Fibromyalgia: Secondary | ICD-10-CM | POA: Diagnosis not present

## 2023-08-03 DIAGNOSIS — E785 Hyperlipidemia, unspecified: Secondary | ICD-10-CM | POA: Diagnosis not present

## 2023-08-03 DIAGNOSIS — G629 Polyneuropathy, unspecified: Secondary | ICD-10-CM | POA: Diagnosis not present

## 2023-08-03 DIAGNOSIS — G894 Chronic pain syndrome: Secondary | ICD-10-CM | POA: Diagnosis not present

## 2023-08-03 DIAGNOSIS — G4733 Obstructive sleep apnea (adult) (pediatric): Secondary | ICD-10-CM | POA: Diagnosis not present

## 2023-08-03 DIAGNOSIS — M300 Polyarteritis nodosa: Secondary | ICD-10-CM | POA: Diagnosis not present

## 2023-08-03 DIAGNOSIS — K56609 Unspecified intestinal obstruction, unspecified as to partial versus complete obstruction: Secondary | ICD-10-CM | POA: Diagnosis not present

## 2023-08-03 DIAGNOSIS — K219 Gastro-esophageal reflux disease without esophagitis: Secondary | ICD-10-CM | POA: Diagnosis not present

## 2023-08-03 DIAGNOSIS — E876 Hypokalemia: Secondary | ICD-10-CM | POA: Diagnosis not present

## 2023-08-03 DIAGNOSIS — G43909 Migraine, unspecified, not intractable, without status migrainosus: Secondary | ICD-10-CM | POA: Diagnosis not present

## 2023-08-03 DIAGNOSIS — I5033 Acute on chronic diastolic (congestive) heart failure: Secondary | ICD-10-CM | POA: Diagnosis not present

## 2023-08-03 DIAGNOSIS — I872 Venous insufficiency (chronic) (peripheral): Secondary | ICD-10-CM | POA: Diagnosis not present

## 2023-08-03 DIAGNOSIS — E559 Vitamin D deficiency, unspecified: Secondary | ICD-10-CM | POA: Diagnosis not present

## 2023-08-03 DIAGNOSIS — E538 Deficiency of other specified B group vitamins: Secondary | ICD-10-CM | POA: Diagnosis not present

## 2023-08-03 DIAGNOSIS — G9529 Other cord compression: Secondary | ICD-10-CM | POA: Diagnosis not present

## 2023-08-03 DIAGNOSIS — M4802 Spinal stenosis, cervical region: Secondary | ICD-10-CM | POA: Diagnosis not present

## 2023-08-03 DIAGNOSIS — J45909 Unspecified asthma, uncomplicated: Secondary | ICD-10-CM | POA: Diagnosis not present

## 2023-08-03 NOTE — Patient Instructions (Signed)
 Visit Information  Thank you for taking time to visit with me today. Please don't hesitate to contact me if I can be of assistance to you before our next scheduled telephone appointment.  Our next appointment is by telephone on Thursday 08/10/23 at 3:00 pm  Please call the care guide team at [phone] if you need to cancel or reschedule your appointment.   Following are the goals we discussed today:  Patient Goals/Self-Care Activities: Participate in Transition of Care Program/Attend TOC scheduled calls Take all medications as prescribed Attend all scheduled provider appointments Call provider office for new concerns or questions  Continue working with the home health team that is involved in your care Stay in touch with the medicaid team you have been working with, to determine the status of your request for personal care services Please continue regularly monitoring and caring for your skin condition- make sure that the home health nurse evaluates your bottom and your skin with every home visit Continue using home oxygen as prescribed Use assistive devices as needed to prevent falls If you believe your condition is getting worse- contact your care providers (doctors) promptly- reaching out to your doctor early when you have concerns can prevent you from having to go to the hospital  If you are experiencing a Mental Health or Behavioral Health Crisis or need someone to talk to, please  call the Suicide and Crisis Lifeline: 988 call the [place] National Suicide Prevention Lifeline: (941)513-7088 or TTY: 915-725-0495 TTY 234-670-5832) to talk to a trained counselor call 1-800-273-TALK (toll free, 24 hour hotline) go to [facility] [address] [place] [phone]) call the [facility] Crisis Line: 9108125978 call 911   Patient verbalizes understanding of instructions and care plan provided today and agrees to view in MyChart. Active  MyChart status and patient understanding of how to access instructions and care plan via MyChart confirmed with patient.     Caryl Pina, RN, BSN, Media planner  Transitions of Care  [facility]BCI - [facility] [phone]: direct office

## 2023-08-03 NOTE — Patient Outreach (Signed)
 Care Management  Transitions of Care Program Transitions of Care Post-discharge week 3/ day # 15   08/03/2023 Name: [name] MRN: [id number] DOB: Sep 30, 1970  Subjective: [name] is a 53 y.o. year old female who is a primary care patient of [name], Helane Rima, MD. The Care Management team  Engaged with patient by telephone to assess and address transitions of care needs.   Consent to Services:  Patient was given information about care management services, agreed to services, and gave verbal consent to participate.   Enrolled into TOC 30-day program:  07/19/23  Assessment:  "I am fine, no changes.  The home health PT is on the way now; the nurse and the OT came on Tuesday, said they had no concerns; I had to cancel the pulmonary appointment yesterday because the hover-round has malfunctioned and I have to use that to get on the [facility] bus for them to take me to the appointment.  I hadn't thought about calling the insurance company to see if they could take me as a back up, but I will do that for the appointments next week, just in case the hover-round doesn't get fixed in time.  I am doing fine, staying in touch with the medicaid people"  Denies clinical concerns and sounds to be in no distress throughout [facility] 30-day program outreach call today          SDOH Interventions    Flowsheet Row Telephone from 07/19/2023 in [facility] POPULATION HEALTH DEPARTMENT Telephone from 01/17/2023 in [facility] Community Care Coordination Video Visit from 01/09/2023 in [facility] Telephone from 11/25/2022 in [facility] Community Care Coordination Clinical Support from 11/02/2022 in [facility] Telephone from 06/01/2022 in [facility] Community Care Coordination  SDOH Interventions        Food Insecurity Interventions Intervention Not Indicated Intervention Not Indicated -- Intervention Not  Indicated Intervention Not Indicated Intervention Not Indicated  Housing Interventions Intervention Not Indicated  [lives in single family home with her adult son] -- -- -- Intervention Not Indicated Intervention Not Indicated  Transportation Interventions Intervention Not Indicated  [established with / uses [facility] regularly- at baseline] Intervention Not Indicated  [pt confirms she uses [facility]] -- Intervention Not Indicated  [pt states she uses [facility]] Intervention Not Indicated Intervention Not Indicated  Utilities Interventions Intervention Not Indicated -- -- -- Intervention Not Indicated Intervention Not Indicated  Alcohol Usage Interventions -- -- -- -- Intervention Not Indicated (Score <7) --  Depression Interventions/Treatment  -- -- Medication, Currently on Treatment -- -- --  Financial Strain Interventions -- -- -- -- Intervention Not Indicated --  Physical Activity Interventions -- -- -- -- Intervention Not Indicated --  Stress Interventions -- -- -- -- Intervention Not Indicated --  Social Connections Interventions -- -- -- -- Intervention Not Indicated --        Goals Addressed             This Visit's Progress    TOC 30-day Program Care Plan   On track    Current Barriers:  Medication management confirms uses compliance packaging; son assists with all aspects of medication management Provider appointments 07/26/23- PCP; 08/02/23- pulmonary provider; 08/07/23- ID provider (new patient- for resistant bacteria in urine noted on 07/18/23); 08/09/23- cardiology provider- new patient appointment Home Health services reports has been established with [facility] "for several years now" Equipment/DME see DME notes in  TOC template: patient currently has pending orders through [employer] for: bariatric hospital bed; electric wheelchair; also reports has new order for new CPAP machine- reports expects CPAP machine to "arrive any day now;" reports she is using a friends "borrowed  hover-round" currently, prn for assistance with mobility; reports difficulty getting her current standard wheelchair through door frames in her home Established with [facility] for transportation services Fragile state of health, multiple progressing chronic health conditions- reports has been bed-bound since "2021;" reports "doctors are not sure what happened to cause me to become the way I am--- I used to be completely independent until I got sick in 2021" Total care patient: has applied for medicaid/ CAPS program: reports spoke to her caseworker yesterday, who told her, "you are number 13 on our list- so services will be starting sooner than later;" confirms she has contact information for medicaid/ CAPS referral caseworker- confirms she stays in touch with case worker "regularly" 07/27/23: Patient tells me she has maintained communication with medicaid "people;" but reports that she is frustrated due to "talking to different people all the time;" encouraged her to obtain a point-person/ contact information to avoid getting conflicting information- she will do this; states that she was told "just this week that I am now number 9 on the wait list" 3 inpatient hospitalizations x last 12 months:  at time of last hospital discharge on 07/17/23--- PCP messaged patient on 07/18/23 that her urine culture resulted with a resistant bacteria- PCP made referral for ID provider, given patient's multiple documented allergies to antibiotics:  verified the appointment is approximately 3 weeks away-- on 08/07/23-- will message PCP to make her aware of same 07/27/23: reports today that during her virtual office visit with PCP on 07/26/23, PCP advised that urgent ID appointment is not indicated: PCP reported to patient that the infection that "caused" the hospitalization was "cleared up" during the hospital visit; patient reports PCP advised to still see the ID provider as scheduled on 08/07/23- but "no need" for antibiotics  "now" Reports has "small, dime-sized" open bedsore starting on "the right side of my butt;" need to ensure proper ongoing skin care/ assessments by home health team, currently established in patient's care 07/27/23:  patient reports area "is now completely healed;" reports this was verified by home health RN who visited patient's home today: confirmed home health nurse also drew blood and obtained urine for lab testing as per PCP orders for same 07/27/23:  DME update- confirmed bariatric hospital bed was delivered on Monday 07/24/23; confirmed patient also continuing to regularly communicate with PCP re: need for appropriate motorized wheelchair according to the limitations of her home- this remains pending as of today but review of EHR clearly indicates that PCP team is actively working on this; patient clarified today that her prior report that CPAP was to be delivered is actually what sounds like a in-home sleep study- she confirms plans to attend pulmonary provider office visit next week as scheduled on 08/02/23  -- 08/03/23: reports that her "electric lift" has been delivered and she/ caregiver are now using -- 08/03/23: reports her existing "hover-round" electric wheelchair has malfunctioned, which caused her to miss/ cancel scheduled 08/02/23 pulmonary provider office visit: states she is waiting for the replacement part and she assures me that it "will arrive soon;" and she will be able to attend 08/07/23 ID provider office visit and 08/09/23 cardiology provider office visit  RNCM Clinical Goal(s):  Patient will work with the Care Management team over the  next 30 days to address Transition of Care Barriers: Support at home Home Health services Equipment/DME Need for ongoing support around clinical condition/ care at home in total care patient take all medications exactly as prescribed and will call provider for medication related questions as evidenced by review of same with patient and caregiver  during weekly TOC 30-day program outreach calls attend all scheduled medical appointments: as noted above as evidenced by review of same with patient and caregiver- son during [facility] 30-day program weekly outreach calls  through collaboration with Medical illustrator, provider, and care team.   Interventions: Evaluation of current treatment plan related to  self management and patient's adherence to plan as established by provider  Transitions of Care:  Goal on track:  Yes. 08/03/23 Durable Medical Equipment (DME) needs assessed with patient/caregiver Doctor Visits  - discussed the importance of doctor visits Post discharge activity limitations prescribed by provider reviewed Reviewed Signs and symptoms of infection Discussed current clinical condition:  "I am fine, no changes.  The home health PT is on the way now; the nurse and the OT came on Tuesday, said they had no concerns; I had to cancel the pulmonary appointment yesterday because the hover-round has malfunctioned and I have to use that to get on the [facility] bus for them to take me to the appointment.  I hadn't thought about calling the insurance company to see if they could take me as a back up, but I will do that for the appointments next week, just in case the hover-round doesn't get fixed in time.  I am doing fine, staying in touch with the medicaid people"  patient denies specific clinical concerns today Confirmed continues using home O2- continues using "all the time" Reviewed provider office visits: she had scheduled pulmonary office visit on 08/02/23- cancelled:  she stated "the hover-round malfunctioned and my son has called the company; we are waiting on the new part which should be here anytime;" she tells me that once the hover-round is repaired, she will be able to attend upcoming provider office visits, as above in barriers 08/07/23: ID provider- new patient visit 08/09/23: cardiology provider- new patient visit Apparently patient only has  to use the hover-round for her appointments that [facility] bus takes her to- I suggested she also call her insurance provider to arrange transportation for next week's appointments, just in case the hover-round does not get fixed in time for the appointments; reminded patient that the insurance company requires several days in advanced to schedule, and encouraged her to schedule today for next week's appointment-- she can always cancel the transportation if the hover-round gets repaired: she is agreeable to this and states she will do Confirmed no recent changes to medications; again reports continues using compliance packaging: again reports very good/ 100% adherence to medication regimen  Reviewed multiple upcoming provider office visits as above: confirmed patient is aware of all and has plans to attend as scheduled 08/02/23- pulmonary provider- canceled as above: now re-scheduled for 09/11/23 08/07/23- ID provider 08/08/23- cardiology provider Update on medicaid PCS waiting list obtained from patient-  she again reports she and her son contact them each week for updates; she again denies need for additional assistance around this [facility] 30-day program initial assessment completed today  Patient Goals/Self-Care Activities: Participate in Transition of Care Program/Attend TOC scheduled calls Take all medications as prescribed Attend all scheduled provider appointments Call provider office for new concerns or questions  Continue working with the home health team  that is involved in your care Stay in touch with the medicaid team you have been working with, to determine the status of your request for personal care services Please continue regularly monitoring and caring for your skin condition- make sure that the home health nurse evaluates your bottom and your skin with every home visit Continue using home oxygen as prescribed Use assistive devices as needed to prevent falls If you believe your condition  is getting worse- contact your care providers (doctors) promptly- reaching out to your doctor early when you have concerns can prevent you from having to go to the hospital  Follow Up Plan:  Telephone follow up appointment with care management team member scheduled for:  Thursday 08/10/23 at 3:00 pm    Plan for next week call: Review ID and cardiology provider office visit on 08/07/23 and 08/09/23 Get update on home health services- ensure skin assessment with every home visit Update on pending DME: motorized wheelchair; assess whether hover- round has been repaired/ in use Update on medicaid PCS status- caseworker: refer LCSW as indicated       Plan: Telephone follow up appointment with care management team member scheduled for:   Thursday 08/10/23 at 3:00 pm   Total time spent from review to signing of note/ including any care coordination interventions:  48 minutes  Pls call/ message for questions,  Caryl Pina, RN, BSN, CCRN Alumnus RN Care Manager  Transitions of Care  [facility] [phone]: direct office

## 2023-08-04 ENCOUNTER — Encounter: Admitting: Clinical

## 2023-08-04 ENCOUNTER — Telehealth: Payer: Self-pay | Admitting: Family Medicine

## 2023-08-04 NOTE — Telephone Encounter (Signed)
 Home Health Certification or Plan of Care Tracking  Is this a Certification or Plan of Care? Plan of Care  [facility] Agency: [facility]  Order Number:  [id number]  Has charge sheet been attached? Yes  Where has form been placed:  Labeled & placed in provider bin

## 2023-08-04 NOTE — Progress Notes (Signed)
                Day Deery, LCSWThis encounter was created in error - please disregard. 

## 2023-08-07 ENCOUNTER — Telehealth: Admitting: Infectious Disease

## 2023-08-07 ENCOUNTER — Other Ambulatory Visit: Payer: Self-pay

## 2023-08-07 DIAGNOSIS — N390 Urinary tract infection, site not specified: Secondary | ICD-10-CM

## 2023-08-07 DIAGNOSIS — Z882 Allergy status to sulfonamides status: Secondary | ICD-10-CM | POA: Diagnosis not present

## 2023-08-07 DIAGNOSIS — K580 Irritable bowel syndrome with diarrhea: Secondary | ICD-10-CM

## 2023-08-07 DIAGNOSIS — K56609 Unspecified intestinal obstruction, unspecified as to partial versus complete obstruction: Secondary | ICD-10-CM

## 2023-08-07 DIAGNOSIS — Z22358 Carrier of other enterobacterales: Secondary | ICD-10-CM

## 2023-08-07 MED ORDER — ESTROGENS CONJUGATED 0.625 MG/GM VA CREA: TOPICAL_CREAM | VAGINAL | 12 refills | Status: DC

## 2023-08-07 NOTE — Progress Notes (Signed)
 Virtual Visit via Video Note  I connected with [name] on 08/07/23 at  1:00 PM EDT by a video enabled telemedicine application and verified that I am speaking with the correct person using two identifiers.  Location: Patient: Home Provider: [facility]   I discussed the limitations of evaluation and management by telemedicine and the availability of in person appointments. The patient expressed understanding and agreed to proceed.  History of Present Illness:      Reason for infectious ease consult: History of recurrent urinary tract infections and recent culture positive for ESBL  Reuesting physician: Laurine Blazer, MD   Patient ID: [name], female    DOB: September 02, 1970, 53 y.o.   MRN: [id number]  HPI  [name] is a 53 year old woman with past medical history significant for morbid obesity status post gastric bypass surgery, sleep apnea migraine headaches and more recently recurrent urinary tract infections since the passing of one of her family members.  He was recently admitted to [facility] health system with symptoms of abdominal pain severe cramping and reportedly foul-smelling urine and confusion.  She actually was found to have a small bowel obstruction to be her primary issue not a urinary tract infection.  UA did show some pyuria area and she received ceftriaxone though probably was not needed. She improved clinically in terms of her small bowel obstruction also with fluids and conservative management.  Later during the course of her stay a urine culture was obtained and it showed  Extended spectrum beta-lactamase producing E. coli that was sensitive by report only to ampicillin sulbactam gentamicin and imipenem of course Zosyn and Bactrim.  The patient had a history of Bactrim allergy where the Bactrim had caused irritation in her eyes when she was much younger.  Our team was called and I believe I was called actually about this patient and advised against treating this since  she had 0 urinary tract and symptoms at the time that the culture was obtained.  Primary care physician referred her further on to our office.  Today she was going to come in person but she has had some suffering from diarrhea as she has what she thinks there is irritable bowel syndrome though it has not been formally diagnosed.  She currently is without any urinary symptoms.  She has had 3 urinary tract infections as mentioned in 2024.  When she describes the which she had they do indeed sound like urinary tract infections with presence of dysuria suprapubic discomfort.  She is perimenopausal I think she would benefit from Premarin cream.  I will prescribe this for her for now and her primary care physician can continue it after the years worth of prescription runs out if she is no longer following up with me.  I emphasized to her the importance of symptoms always being present for urinary tract infection to be considered.  This key element is frequently ignored by ER physicians and others.  With regards to her recent hospitalization she was confused the time of admission so of course in the context of confusion is hard to elicit symptoms but we certainly have seen 100s of people getting urine cultures with 0 symptoms who are lucid.  Similarly it is not proper to check cultures to see if a urinary tract infection has resolved a urine tract infection is resolved if the patient has resolution of her symptoms not sterile urine.  I also mentioned that if she is someone has urinary symptoms they should have  pyuria and that this is later than confirmed by the positive culture while the patient is receiving empiric antibiotics.  I conveyed all of this which should be elementary information largely to protect her from what happens in a widespread scale in particular in our emergency departments.  Past Medical History:  Diagnosis Date   Allergic rhinitis    Ankle fracture, right    Anxiety    ASCUS  of cervix with negative high risk HPV 05/2018   Carpal tunnel syndrome    Cervical cancer (HCC) 1998   Fibromyalgia    Hyperlipidemia    Hypertension    Migraine headache    Obesity    Sleep apnea     Past Surgical History:  Procedure Laterality Date   ABDOMINAL HYSTERECTOMY  10/1996   ABDOMINAL SURGERY  jan/11/2012   gastric bypass surgery   CARPAL TUNNEL RELEASE     bilateral    CESAREAN SECTION     CHOLECYSTECTOMY  07/1992   ESOPHAGOGASTRODUODENOSCOPY N/A 01/14/2016   Procedure: ESOPHAGOGASTRODUODENOSCOPY (EGD);  Surgeon: Napoleon Form, MD;  Location: [facility] ENDOSCOPY;  Service: Endoscopy;  Laterality: N/A;    Family History  Problem Relation Age of Onset   Diabetes Mother    Emphysema Mother    Allergies Mother    Hypertension Mother    Heart disease Father    Allergies Father    Prostate cancer Father    Breast cancer Maternal Grandmother 58      Social History   Socioeconomic History   Marital status: Single    Spouse name: Not on file   Number of children: Not on file   Years of education: Not on file   Highest education level: Not on file  Occupational History   Occupation: disabled. prev worked as a Scientist, physiological  Tobacco Use   Smoking status: Never   Smokeless tobacco: Never  Vaping Use   Vaping status: Never Used  Substance and Sexual Activity   Alcohol use: No   Drug use: No   Sexual activity: Yes    Comment: TAH-1st intercourse 17 yo-5 partners  Other Topics Concern   Not on file  Social History Narrative   Not on file   Social Drivers of Health   Financial Resource Strain: Low Risk  (11/02/2022)   Overall Financial Resource Strain (CARDIA)    Difficulty of Paying Living Expenses: Not hard at all  Food Insecurity: No Food Insecurity (07/19/2023)   Hunger Vital Sign    Worried About Running Out of Food in the Last Year: Never true    Ran Out of Food in the Last Year: Never true  Transportation Needs: No Transportation Needs (07/19/2023)    PRAPARE - Administrator, Civil Service (Medical): No    Lack of Transportation (Non-Medical): No  Physical Activity: Inactive (11/02/2022)   Exercise Vital Sign    Days of Exercise per Week: 0 days    Minutes of Exercise per Session: 0 min  Stress: Stress Concern Present (11/02/2022)   [name] of Occupational Health - Occupational Stress Questionnaire    Feeling of Stress : Rather much  Social Connections: Unknown (11/02/2022)   Social Connection and Isolation Panel [NHANES]    Frequency of Communication with Friends and Family: Once a week    Frequency of Social Gatherings with Friends and Family: Once a week    Attends Religious Services: Never    Database administrator or Organizations: No    Attends Club or  Organization Meetings: Never    Marital Status: Not on file  Recent Concern: Social Connections - Socially Isolated (11/02/2022)   Social Connection and Isolation Panel [NHANES]    Frequency of Communication with Friends and Family: Once a week    Frequency of Social Gatherings with Friends and Family: Once a week    Attends Religious Services: Never    Database administrator or Organizations: No    Attends Engineer, structural: Never    Marital Status: Divorced    Allergies  Allergen Reactions   Erythromycin Anaphylaxis   Niacin Anaphylaxis, Shortness Of Breath and Swelling    Swelling and "problems breathing"   Sulfamethoxazole-Trimethoprim Anaphylaxis, Swelling, Rash and Other (See Comments)    Throat closed and the eyes became swollen   Aspirin Hives   Benzoin Compound Rash   Cephalexin Hives    Tolerated ceftriaxone and cefepime 07/2019   Iohexol Hives    Pt treated with PO benedryl   Lisinopril Hives   Naproxen Hives   Pnu-Imune [Pneumococcal Polysaccharide Vaccine] Rash   Sulfonamide Derivatives Rash     Current Outpatient Medications:    conjugated estrogens (PREMARIN) vaginal cream, Apply vaginally once daily x 1 week then TWO  times weekly to help with urinary symptoms, Disp: 42.5 g, Rfl: 12   albuterol (VENTOLIN HFA) 108 (90 Base) MCG/ACT inhaler, Inhale 1 puff into the lungs every 6 (six) hours as needed for wheezing or shortness of breath., Disp: , Rfl:    ALPRAZolam (XANAX) 1 MG tablet, TAKE 1 TABLET BY MOUTH 2 TIMES DAILY, Disp: 60 tablet, Rfl: 3   ARIPiprazole (ABILIFY) 15 MG tablet, TAKE 1 TABLET BY MOUTH EVERY DAY, Disp: 90 tablet, Rfl: 1   Armodafinil 250 MG tablet, Take 1 tablet (250 mg total) by mouth in the morning and at bedtime., Disp: 60 tablet, Rfl: 3   Biotin 10 MG TABS, Take 1 tablet (10 mg total) by mouth in the morning., Disp: 90 tablet, Rfl: 1   buPROPion (WELLBUTRIN XL) 300 MG 24 hr tablet, TAKE 1 TABLET BY MOUTH EVERY DAY, Disp: 30 tablet, Rfl: 2   calcium citrate (CALCITRATE - DOSED IN MG ELEMENTAL CALCIUM) 950 (200 Ca) MG tablet, TAKE 1 TABLET BY MOUTH ONCE DAILY, Disp: 90 tablet, Rfl: 1   cyanocobalamin (VITAMIN B12) 1000 MCG tablet, TAKE 1 TABLET BY MOUTH ONCE DAILY, Disp: 30 tablet, Rfl: 2   diclofenac Sodium (VOLTAREN) 1 % GEL, Apply 4 g topically 4 (four) times daily., Disp: 1 g, Rfl: 1   DULoxetine (CYMBALTA) 60 MG capsule, Take 2 capsules (120 mg total) by mouth daily. (Patient taking differently: Take 60 mg by mouth 2 (two) times daily.), Disp: 240 capsule, Rfl: 2   FEROSUL 325 (65 Fe) MG tablet, TAKE 1 TABLET BY MOUTH EVERY MORNING WITH BREAKFAST, Disp: 30 tablet, Rfl: 2   fluticasone-salmeterol (ADVAIR) 250-50 MCG/ACT AEPB, Inhale 1 puff into the lungs every 12 (twelve) hours., Disp: 60 each, Rfl: 11   folic acid (FOLVITE) 1 MG tablet, TAKE 1 TABLET BY MOUTH ONCE DAILY, Disp: 30 tablet, Rfl: 2   furosemide (LASIX) 40 MG tablet, TAKE 1 TABLET BY MOUTH ONCE DAILY MAY TAKE AN ADDITIONAL TABLET BY MOUTH IF WEIGHT GAIN OF 5LBS AND CALL MD FOR FURTHER INSTRUCTIONS, Disp: 90 tablet, Rfl: 1   gabapentin (NEURONTIN) 600 MG tablet, Take 1 tablet (600 mg total) by mouth 2 (two) times daily as needed.  Pt takes daily - can take up to twice daily, Disp:  60 tablet, Rfl: 1   GNP VITAMIN B-1 100 MG tablet, TAKE 1 TABLET BY MOUTH EVERY DAY, Disp: 30 tablet, Rfl: 2   HYDROcodone-acetaminophen (NORCO) 10-325 MG tablet, TAKE 1 TABLET BY MOUTH EVERY 8 HOURS AS NEEDED, Disp: 90 tablet, Rfl: 0   ipratropium-albuterol (DUONEB) 0.5-2.5 (3) MG/3ML SOLN, Take 3 mLs by nebulization every 6 (six) hours as needed (wheezing/shortness of breath)., Disp: 360 mL, Rfl: 0   magnesium oxide (MAG-OX) 400 (240 Mg) MG tablet, Take 1 tablet by mouth daily., Disp: , Rfl:    nebivolol (BYSTOLIC) 10 MG tablet, TAKE 1 TABLET BY MOUTH EVERY DAY, Disp: 30 tablet, Rfl: 2   nystatin (MYCOSTATIN/NYSTOP) powder, APPLY 1 APPLICATION TOPICALLY 3 TIMES DAILY (Patient taking differently: Apply 1 Application topically 3 (three) times daily as needed (yeast infection).), Disp: 60 g, Rfl: 3   Oxcarbazepine (TRILEPTAL) 300 MG tablet, Take 300 mg by mouth 2 (two) times daily., Disp: , Rfl:    pantoprazole (PROTONIX) 40 MG tablet, TAKE 1 TABLET BY MOUTH 2 TIMES DAILY, Disp: 60 tablet, Rfl: 2   phentermine 37.5 MG capsule, Take 1 capsule (37.5 mg total) by mouth every morning., Disp: 90 capsule, Rfl: 0   potassium chloride (KLOR-CON) 20 MEQ packet, Take 1 packet by mouth daily. Take with lasix., Disp: 30 packet, Rfl: 1   promethazine (PHENERGAN) 25 MG tablet, TAKE 1 TABLET BY MOUTH EVERY 6 HOURS AS NEEDED FOR NAUSEA AND VOMITING, Disp: 45 tablet, Rfl: 3   promethazine-dextromethorphan (PROMETHAZINE-DM) 6.25-15 MG/5ML syrup, Take 5 mLs by mouth 4 (four) times daily as needed. (Patient taking differently: Take 5 mLs by mouth 4 (four) times daily as needed for cough.), Disp: 180 mL, Rfl: 0   pyridOXINE (VITAMIN B6) 100 MG tablet, TAKE 1 TABLET BY MOUTH ONCE DAILY, Disp: 30 tablet, Rfl: 2  Observations/Objective: She appears to be relatively comfortable despite her recent problems with diarrhea today was lying in the bed on the video  feed  Assessment and Plan: Recurrent urinary tract infections:  I think she might benefit from Premarin cream and I have written a prescription Premarin to be taken intravaginally once daily for a week followed by twice weekly afterwards.  There is no role for prophylactic antibiotics and certainly not in a patient with colonization with ESBL.  She could also consider being seen by urologist.  ESBL coli in urine culture within 2 March: This positive cultures of no significance and she lacked symptoms at the time of culture having been taken it is now 22 days later and she has not had any evidence of urinary tract infection since then.  Small bowel obstruction: This is resolved  Sulfa allergy: with the fact that she had eye irritation with bactrim I back this is not a true allergy and that we could likely use Bactrim and her safely though I will leave this on her allergy profile until we have actually use the antibiotic and see no adverse effects  She can RTC PRN and I would be happy to see her again  I have personally spent 62 minutes involved in face-to-face and non-face-to-face activities for this patient on the day of the visit. Professional time spent includes the following activities: Preparing to see the patient (review of tests), Obtaining and/or reviewing separately obtained history (admission/discharge record), Performing a medically appropriate examination and/or evaluation , Ordering medications/tests/procedures, referring and communicating with other health care professionals, Documenting clinical information in the EMR, Independently interpreting results (not separately reported), Communicating results to the  patient/family/caregiver, Counseling and educating the patient/family/caregiver and Care coordination (not separately reported).    Follow Up Instructions:    I discussed the assessment and treatment plan with the patient. The patient was provided an opportunity to ask  questions and all were answered. The patient agreed with the plan and demonstrated an understanding of the instructions.   The patient was advised to call back or seek an in-person evaluation if the symptoms worsen or if the condition fails to improve as anticipated.  I provided 62 minutes of non-face-to-face time during this encounter.   Acey Lav, MD

## 2023-08-07 NOTE — Telephone Encounter (Signed)
 This was placed in folder 08/05/2023.

## 2023-08-07 NOTE — Progress Notes (Signed)
 Subjective:  Reason for infectious ease consult: History of recurrent urinary tract infections and recent culture positive for ESBL  Reuesting physician: Erica Blazer, MD   Patient ID: [name], female    DOB: 1970/09/29, 53 y.o.   MRN: [id number]  HPI  [name] is a 53 year old woman with past medical history significant for morbid obesity status post gastric bypass surgery, sleep apnea migraine headaches and more recently recurrent urinary tract infections since the passing of one of her family members.    Past Medical History:  Diagnosis Date   Allergic rhinitis    Ankle fracture, right    Anxiety    ASCUS of cervix with negative high risk HPV 05/2018   Carpal tunnel syndrome    Cervical cancer (HCC) 1998   Fibromyalgia    Hyperlipidemia    Hypertension    Migraine headache    Obesity    Sleep apnea     Past Surgical History:  Procedure Laterality Date   ABDOMINAL HYSTERECTOMY  10/1996   ABDOMINAL SURGERY  jan/11/2012   gastric bypass surgery   CARPAL TUNNEL RELEASE     bilateral    CESAREAN SECTION     CHOLECYSTECTOMY  07/1992   ESOPHAGOGASTRODUODENOSCOPY N/A 01/14/2016   Procedure: ESOPHAGOGASTRODUODENOSCOPY (EGD);  Surgeon: Napoleon Form, MD;  Location[facility] ENDOSCOPY;  Service: Endoscopy;  Laterality: N/A;    Family History  Problem Relation Age of Onset   Diabetes Mother    Emphysema Mother    Allergies Mother    Hypertension Mother    Heart disease Father    Allergies Father    Prostate cancer Father    Breast cancer Maternal Grandmother 75      Social History   Socioeconomic History   Marital status: Single    Spouse name: Not on file   Number of children: Not on file   Years of education: Not on file   Highest education level: Not on file  Occupational History   Occupation: disabled. prev worked as a Scientist, physiological  Tobacco Use   Smoking status: Never   Smokeless tobacco: Never  Vaping Use   Vaping status: Never Used  Substance and  Sexual Activity   Alcohol use: No   Drug use: No   Sexual activity: Yes    Comment: TAH-1st intercourse 17 yo-5 partners  Other Topics Concern   Not on file  Social History Narrative   Not on file   Social Drivers of Health   Financial Resource Strain: Low Risk  (11/02/2022)   Overall Financial Resource Strain (CARDIA)    Difficulty of Paying Living Expenses: Not hard at all  Food Insecurity: No Food Insecurity (07/19/2023)   Hunger Vital Sign    Worried About Running Out of Food in the Last Year: Never true    Ran Out of Food in the Last Year: Never true  Transportation Needs: No Transportation Needs (07/19/2023)   PRAPARE - Administrator, Civil Service (Medical): No    Lack of Transportation (Non-Medical): No  Physical Activity: Inactive (11/02/2022)   Exercise Vital Sign    Days of Exercise per Week: 0 days    Minutes of Exercise per Session: 0 min  Stress: Stress Concern Present (11/02/2022)   [name] of Occupational Health - Occupational Stress Questionnaire    Feeling of Stress : Rather much  Social Connections: Unknown (11/02/2022)   Social Connection and Isolation Panel [NHANES]    Frequency of Communication with Friends and Family:  Once a week    Frequency of Social Gatherings with Friends and Family: Once a week    Attends Religious Services: Never    Database administrator or Organizations: No    Attends Engineer, structural: Never    Marital Status: Not on file  Recent Concern: Social Connections - Socially Isolated (11/02/2022)   Social Connection and Isolation Panel [NHANES]    Frequency of Communication with Friends and Family: Once a week    Frequency of Social Gatherings with Friends and Family: Once a week    Attends Religious Services: Never    Database administrator or Organizations: No    Attends Engineer, structural: Never    Marital Status: Divorced    Allergies  Allergen Reactions   Erythromycin Anaphylaxis    Niacin Anaphylaxis, Shortness Of Breath and Swelling    Swelling and "problems breathing"   Sulfamethoxazole-Trimethoprim Anaphylaxis, Swelling, Rash and Other (See Comments)    Throat closed and the eyes became swollen   Aspirin Hives   Benzoin Compound Rash   Cephalexin Hives    Tolerated ceftriaxone and cefepime 07/2019   Iohexol Hives    Pt treated with PO benedryl   Lisinopril Hives   Naproxen Hives   Pnu-Imune [Pneumococcal Polysaccharide Vaccine] Rash   Sulfonamide Derivatives Rash     Current Outpatient Medications:    conjugated estrogens (PREMARIN) vaginal cream, Apply vaginally once daily x 1 week then TWO times weekly to help with urinary symptoms, Disp: 42.5 g, Rfl: 12   albuterol (VENTOLIN HFA) 108 (90 Base) MCG/ACT inhaler, Inhale 1 puff into the lungs every 6 (six) hours as needed for wheezing or shortness of breath., Disp: , Rfl:    ALPRAZolam (XANAX) 1 MG tablet, TAKE 1 TABLET BY MOUTH 2 TIMES DAILY, Disp: 60 tablet, Rfl: 3   ARIPiprazole (ABILIFY) 15 MG tablet, TAKE 1 TABLET BY MOUTH EVERY DAY, Disp: 90 tablet, Rfl: 1   Armodafinil 250 MG tablet, Take 1 tablet (250 mg total) by mouth in the morning and at bedtime., Disp: 60 tablet, Rfl: 3   Biotin 10 MG TABS, Take 1 tablet (10 mg total) by mouth in the morning., Disp: 90 tablet, Rfl: 1   buPROPion (WELLBUTRIN XL) 300 MG 24 hr tablet, TAKE 1 TABLET BY MOUTH EVERY DAY, Disp: 30 tablet, Rfl: 2   calcium citrate (CALCITRATE - DOSED IN MG ELEMENTAL CALCIUM) 950 (200 Ca) MG tablet, TAKE 1 TABLET BY MOUTH ONCE DAILY, Disp: 90 tablet, Rfl: 1   cyanocobalamin (VITAMIN B12) 1000 MCG tablet, TAKE 1 TABLET BY MOUTH ONCE DAILY, Disp: 30 tablet, Rfl: 2   diclofenac Sodium (VOLTAREN) 1 % GEL, Apply 4 g topically 4 (four) times daily., Disp: 1 g, Rfl: 1   DULoxetine (CYMBALTA) 60 MG capsule, Take 2 capsules (120 mg total) by mouth daily. (Patient taking differently: Take 60 mg by mouth 2 (two) times daily.), Disp: 240 capsule, Rfl: 2    FEROSUL 325 (65 Fe) MG tablet, TAKE 1 TABLET BY MOUTH EVERY MORNING WITH BREAKFAST, Disp: 30 tablet, Rfl: 2   fluticasone-salmeterol (ADVAIR) 250-50 MCG/ACT AEPB, Inhale 1 puff into the lungs every 12 (twelve) hours., Disp: 60 each, Rfl: 11   folic acid (FOLVITE) 1 MG tablet, TAKE 1 TABLET BY MOUTH ONCE DAILY, Disp: 30 tablet, Rfl: 2   furosemide (LASIX) 40 MG tablet, TAKE 1 TABLET BY MOUTH ONCE DAILY MAY TAKE AN ADDITIONAL TABLET BY MOUTH IF WEIGHT GAIN OF 5LBS AND CALL MD  FOR FURTHER INSTRUCTIONS, Disp: 90 tablet, Rfl: 1   gabapentin (NEURONTIN) 600 MG tablet, Take 1 tablet (600 mg total) by mouth 2 (two) times daily as needed. Pt takes daily - can take up to twice daily, Disp: 60 tablet, Rfl: 1   GNP VITAMIN B-1 100 MG tablet, TAKE 1 TABLET BY MOUTH EVERY DAY, Disp: 30 tablet, Rfl: 2   HYDROcodone-acetaminophen (NORCO) 10-325 MG tablet, TAKE 1 TABLET BY MOUTH EVERY 8 HOURS AS NEEDED, Disp: 90 tablet, Rfl: 0   ipratropium-albuterol (DUONEB) 0.5-2.5 (3) MG/3ML SOLN, Take 3 mLs by nebulization every 6 (six) hours as needed (wheezing/shortness of breath)., Disp: 360 mL, Rfl: 0   magnesium oxide (MAG-OX) 400 (240 Mg) MG tablet, Take 1 tablet by mouth daily., Disp: , Rfl:    nebivolol (BYSTOLIC) 10 MG tablet, TAKE 1 TABLET BY MOUTH EVERY DAY, Disp: 30 tablet, Rfl: 2   nystatin (MYCOSTATIN/NYSTOP) powder, APPLY 1 APPLICATION TOPICALLY 3 TIMES DAILY (Patient taking differently: Apply 1 Application topically 3 (three) times daily as needed (yeast infection).), Disp: 60 g, Rfl: 3   Oxcarbazepine (TRILEPTAL) 300 MG tablet, Take 300 mg by mouth 2 (two) times daily., Disp: , Rfl:    pantoprazole (PROTONIX) 40 MG tablet, TAKE 1 TABLET BY MOUTH 2 TIMES DAILY, Disp: 60 tablet, Rfl: 2   phentermine 37.5 MG capsule, Take 1 capsule (37.5 mg total) by mouth every morning., Disp: 90 capsule, Rfl: 0   potassium chloride (KLOR-CON) 20 MEQ packet, Take 1 packet by mouth daily. Take with lasix., Disp: 30 packet, Rfl: 1    promethazine (PHENERGAN) 25 MG tablet, TAKE 1 TABLET BY MOUTH EVERY 6 HOURS AS NEEDED FOR NAUSEA AND VOMITING, Disp: 45 tablet, Rfl: 3   promethazine-dextromethorphan (PROMETHAZINE-DM) 6.25-15 MG/5ML syrup, Take 5 mLs by mouth 4 (four) times daily as needed. (Patient taking differently: Take 5 mLs by mouth 4 (four) times daily as needed for cough.), Disp: 180 mL, Rfl: 0   pyridOXINE (VITAMIN B6) 100 MG tablet, TAKE 1 TABLET BY MOUTH ONCE DAILY, Disp: 30 tablet, Rfl: 2  Review of Systems     Objective:   Physical Exam        Assessment & Plan:

## 2023-08-08 ENCOUNTER — Telehealth: Payer: Self-pay | Admitting: Family Medicine

## 2023-08-08 DIAGNOSIS — I5033 Acute on chronic diastolic (congestive) heart failure: Secondary | ICD-10-CM | POA: Diagnosis not present

## 2023-08-08 DIAGNOSIS — J45909 Unspecified asthma, uncomplicated: Secondary | ICD-10-CM | POA: Diagnosis not present

## 2023-08-08 DIAGNOSIS — K219 Gastro-esophageal reflux disease without esophagitis: Secondary | ICD-10-CM | POA: Diagnosis not present

## 2023-08-08 DIAGNOSIS — M300 Polyarteritis nodosa: Secondary | ICD-10-CM | POA: Diagnosis not present

## 2023-08-08 DIAGNOSIS — E538 Deficiency of other specified B group vitamins: Secondary | ICD-10-CM | POA: Diagnosis not present

## 2023-08-08 DIAGNOSIS — K760 Fatty (change of) liver, not elsewhere classified: Secondary | ICD-10-CM | POA: Diagnosis not present

## 2023-08-08 DIAGNOSIS — K56609 Unspecified intestinal obstruction, unspecified as to partial versus complete obstruction: Secondary | ICD-10-CM | POA: Diagnosis not present

## 2023-08-08 DIAGNOSIS — G629 Polyneuropathy, unspecified: Secondary | ICD-10-CM | POA: Diagnosis not present

## 2023-08-08 DIAGNOSIS — E559 Vitamin D deficiency, unspecified: Secondary | ICD-10-CM | POA: Diagnosis not present

## 2023-08-08 DIAGNOSIS — G43909 Migraine, unspecified, not intractable, without status migrainosus: Secondary | ICD-10-CM | POA: Diagnosis not present

## 2023-08-08 DIAGNOSIS — I872 Venous insufficiency (chronic) (peripheral): Secondary | ICD-10-CM | POA: Diagnosis not present

## 2023-08-08 DIAGNOSIS — M4802 Spinal stenosis, cervical region: Secondary | ICD-10-CM | POA: Diagnosis not present

## 2023-08-08 DIAGNOSIS — L400 Psoriasis vulgaris: Secondary | ICD-10-CM | POA: Diagnosis not present

## 2023-08-08 DIAGNOSIS — N390 Urinary tract infection, site not specified: Secondary | ICD-10-CM | POA: Diagnosis not present

## 2023-08-08 DIAGNOSIS — I110 Hypertensive heart disease with heart failure: Secondary | ICD-10-CM | POA: Diagnosis not present

## 2023-08-08 DIAGNOSIS — M797 Fibromyalgia: Secondary | ICD-10-CM | POA: Diagnosis not present

## 2023-08-08 DIAGNOSIS — G9529 Other cord compression: Secondary | ICD-10-CM | POA: Diagnosis not present

## 2023-08-08 DIAGNOSIS — E876 Hypokalemia: Secondary | ICD-10-CM | POA: Diagnosis not present

## 2023-08-08 DIAGNOSIS — G4733 Obstructive sleep apnea (adult) (pediatric): Secondary | ICD-10-CM | POA: Diagnosis not present

## 2023-08-08 DIAGNOSIS — E785 Hyperlipidemia, unspecified: Secondary | ICD-10-CM | POA: Diagnosis not present

## 2023-08-08 DIAGNOSIS — G894 Chronic pain syndrome: Secondary | ICD-10-CM | POA: Diagnosis not present

## 2023-08-08 NOTE — Telephone Encounter (Signed)
 Home Health Certification or Plan of Care Tracking  Is this a Certification or Plan of Care? Plan of Care  [facility] Agency: [facility]  Order Number:  [id number]  Has charge sheet been attached? Yes  Where has form been placed:   Labeled & placed in provider bin

## 2023-08-09 ENCOUNTER — Ambulatory Visit: Payer: 59 | Admitting: Cardiology

## 2023-08-09 DIAGNOSIS — N390 Urinary tract infection, site not specified: Secondary | ICD-10-CM | POA: Diagnosis not present

## 2023-08-09 DIAGNOSIS — I872 Venous insufficiency (chronic) (peripheral): Secondary | ICD-10-CM | POA: Diagnosis not present

## 2023-08-09 DIAGNOSIS — I110 Hypertensive heart disease with heart failure: Secondary | ICD-10-CM | POA: Diagnosis not present

## 2023-08-09 DIAGNOSIS — E559 Vitamin D deficiency, unspecified: Secondary | ICD-10-CM | POA: Diagnosis not present

## 2023-08-09 DIAGNOSIS — M4802 Spinal stenosis, cervical region: Secondary | ICD-10-CM | POA: Diagnosis not present

## 2023-08-09 DIAGNOSIS — E876 Hypokalemia: Secondary | ICD-10-CM | POA: Diagnosis not present

## 2023-08-09 DIAGNOSIS — E538 Deficiency of other specified B group vitamins: Secondary | ICD-10-CM | POA: Diagnosis not present

## 2023-08-09 DIAGNOSIS — K219 Gastro-esophageal reflux disease without esophagitis: Secondary | ICD-10-CM | POA: Diagnosis not present

## 2023-08-09 DIAGNOSIS — M797 Fibromyalgia: Secondary | ICD-10-CM | POA: Diagnosis not present

## 2023-08-09 DIAGNOSIS — K56609 Unspecified intestinal obstruction, unspecified as to partial versus complete obstruction: Secondary | ICD-10-CM | POA: Diagnosis not present

## 2023-08-09 DIAGNOSIS — G4733 Obstructive sleep apnea (adult) (pediatric): Secondary | ICD-10-CM | POA: Diagnosis not present

## 2023-08-09 DIAGNOSIS — G43909 Migraine, unspecified, not intractable, without status migrainosus: Secondary | ICD-10-CM | POA: Diagnosis not present

## 2023-08-09 DIAGNOSIS — M300 Polyarteritis nodosa: Secondary | ICD-10-CM | POA: Diagnosis not present

## 2023-08-09 DIAGNOSIS — L400 Psoriasis vulgaris: Secondary | ICD-10-CM | POA: Diagnosis not present

## 2023-08-09 DIAGNOSIS — I5033 Acute on chronic diastolic (congestive) heart failure: Secondary | ICD-10-CM | POA: Diagnosis not present

## 2023-08-09 DIAGNOSIS — G894 Chronic pain syndrome: Secondary | ICD-10-CM | POA: Diagnosis not present

## 2023-08-09 DIAGNOSIS — K760 Fatty (change of) liver, not elsewhere classified: Secondary | ICD-10-CM | POA: Diagnosis not present

## 2023-08-09 DIAGNOSIS — G9529 Other cord compression: Secondary | ICD-10-CM | POA: Diagnosis not present

## 2023-08-09 DIAGNOSIS — G629 Polyneuropathy, unspecified: Secondary | ICD-10-CM | POA: Diagnosis not present

## 2023-08-09 DIAGNOSIS — E785 Hyperlipidemia, unspecified: Secondary | ICD-10-CM | POA: Diagnosis not present

## 2023-08-09 DIAGNOSIS — J45909 Unspecified asthma, uncomplicated: Secondary | ICD-10-CM | POA: Diagnosis not present

## 2023-08-09 NOTE — Telephone Encounter (Signed)
Placed in your sign folder for review

## 2023-08-10 ENCOUNTER — Other Ambulatory Visit: Payer: Self-pay | Admitting: *Deleted

## 2023-08-10 DIAGNOSIS — L89892 Pressure ulcer of other site, stage 2: Secondary | ICD-10-CM | POA: Diagnosis not present

## 2023-08-10 NOTE — Patient Outreach (Signed)
 Care Management  Transitions of Care Program Transitions of Care Post-discharge week 4/ day # 22  08/10/2023 Name: [name] MRN: [id number] DOB: Jan 02, 1971  Subjective: [name] is a 53 y.o. year old female who is a primary care patient of [name], Helane Rima, MD. The Care Management team was unable to reach the patient by phone to assess and address transitions of care needs.   Plan: Additional outreach attempts will be made to reach the patient enrolled in the [facility] Program (Post Inpatient/ED Visit).  08/10/23:  Attempted call for [facility] 30-day program, as previously scheduled- unsuccessful; ; left HIPAA compliant voice message requesting call back; will re-attempt later this week  Pls call/ message for questions,  Caryl Pina, RN, BSN, CCRN Alumnus RN Care Manager  Transitions of Care  [facility] [phone]: direct office

## 2023-08-11 ENCOUNTER — Other Ambulatory Visit: Payer: Self-pay | Admitting: Family Medicine

## 2023-08-11 ENCOUNTER — Telehealth: Payer: Self-pay | Admitting: *Deleted

## 2023-08-11 DIAGNOSIS — M300 Polyarteritis nodosa: Principal | ICD-10-CM

## 2023-08-11 DIAGNOSIS — M4802 Spinal stenosis, cervical region: Secondary | ICD-10-CM | POA: Diagnosis not present

## 2023-08-11 DIAGNOSIS — I110 Hypertensive heart disease with heart failure: Secondary | ICD-10-CM | POA: Diagnosis not present

## 2023-08-11 DIAGNOSIS — E785 Hyperlipidemia, unspecified: Secondary | ICD-10-CM | POA: Diagnosis not present

## 2023-08-11 DIAGNOSIS — L400 Psoriasis vulgaris: Secondary | ICD-10-CM | POA: Diagnosis not present

## 2023-08-11 DIAGNOSIS — I872 Venous insufficiency (chronic) (peripheral): Secondary | ICD-10-CM | POA: Diagnosis not present

## 2023-08-11 DIAGNOSIS — K56609 Unspecified intestinal obstruction, unspecified as to partial versus complete obstruction: Secondary | ICD-10-CM | POA: Diagnosis not present

## 2023-08-11 DIAGNOSIS — N390 Urinary tract infection, site not specified: Secondary | ICD-10-CM | POA: Diagnosis not present

## 2023-08-11 DIAGNOSIS — I5033 Acute on chronic diastolic (congestive) heart failure: Secondary | ICD-10-CM | POA: Diagnosis not present

## 2023-08-11 DIAGNOSIS — G9529 Other cord compression: Secondary | ICD-10-CM | POA: Diagnosis not present

## 2023-08-11 DIAGNOSIS — K219 Gastro-esophageal reflux disease without esophagitis: Secondary | ICD-10-CM | POA: Diagnosis not present

## 2023-08-11 DIAGNOSIS — G894 Chronic pain syndrome: Secondary | ICD-10-CM | POA: Diagnosis not present

## 2023-08-11 DIAGNOSIS — E876 Hypokalemia: Secondary | ICD-10-CM | POA: Diagnosis not present

## 2023-08-11 DIAGNOSIS — E538 Deficiency of other specified B group vitamins: Secondary | ICD-10-CM | POA: Diagnosis not present

## 2023-08-11 DIAGNOSIS — K760 Fatty (change of) liver, not elsewhere classified: Secondary | ICD-10-CM | POA: Diagnosis not present

## 2023-08-11 DIAGNOSIS — J45909 Unspecified asthma, uncomplicated: Secondary | ICD-10-CM | POA: Diagnosis not present

## 2023-08-11 DIAGNOSIS — G4733 Obstructive sleep apnea (adult) (pediatric): Secondary | ICD-10-CM | POA: Diagnosis not present

## 2023-08-11 DIAGNOSIS — E559 Vitamin D deficiency, unspecified: Secondary | ICD-10-CM | POA: Diagnosis not present

## 2023-08-11 DIAGNOSIS — M797 Fibromyalgia: Secondary | ICD-10-CM | POA: Diagnosis not present

## 2023-08-11 DIAGNOSIS — G43909 Migraine, unspecified, not intractable, without status migrainosus: Secondary | ICD-10-CM | POA: Diagnosis not present

## 2023-08-11 DIAGNOSIS — G629 Polyneuropathy, unspecified: Secondary | ICD-10-CM | POA: Diagnosis not present

## 2023-08-11 MED ORDER — GABAPENTIN 600 MG TABLET
ORAL_TABLET | Freq: Three times a day (TID) | ORAL | 3 refills | 0 days
Start: 2023-08-11 — End: ?

## 2023-08-11 NOTE — Patient Outreach (Signed)
 Care Management  Transitions of Care Program Transitions of Care Post-discharge week 4/ day # 23   08/11/2023 Name: [name] MRN: [id number] DOB: 06/03/1970  Subjective: [name] is a 53 y.o. year old female who is a primary care patient of [name], Helane Rima, MD. The Care Management team Engaged with patient by telephone to assess and address transitions of care needs.   Consent to Services:  Patient was given information about care management services, agreed to services, and gave verbal consent to participate.   Enrolled into TOC 30-day program:  07/19/23  Assessment: "I am fine, no changes.  I was asleep when you called yesterday.  The video visit with the infection doctor went great!  He explained everything so well, said no need for taking antibiotics on a regular basis, I felt good about that.  The hover- round part did not fix it, so I called [employer] today and they said they would be bringing a new one to me soon, not sure when.  The home health team has all been here this week, the nurse, the OT, and the PT- the PT is coming back this afternoon.  Nurse says my skin looks fine, she had no concerns.  I don't know what happened with the heart doctor appointment on 08/09/23- I really don't think I cancelled that appointment, I don't know what happened.  I don't think I need a Child psychotherapist to help me figure out this medicaid stuff with the personal care services, but I will think about it and let you know next week when you call."   Denies clinical concerns and sounds to be in no distress throughout [facility] 30-day program outreach call today          SDOH Interventions    Flowsheet Row Telephone from 07/19/2023 in [place] [facility]PARTMENT Telephone from 01/17/2023 in [facility] Community [facility] Coordination Video Visit from 01/09/2023 in [facility] [place] Telephone from 11/25/2022 in [facility] [employer] [facility]  Coordination Clinical Support from 11/02/2022 in [facility] [employer] from 06/01/2022 in [facility] [facility] Coordination  SDOH Interventions        Food Insecurity Interventions Intervention Not Indicated Intervention Not Indicated -- Intervention Not Indicated Intervention Not Indicated Intervention Not Indicated  Housing Interventions Intervention Not Indicated  [lives in single family home with her adult son] -- -- -- Intervention Not Indicated Intervention Not Indicated  Transportation Interventions Intervention Not Indicated  [established with / uses [facility] regularly- at baseline] Intervention Not Indicated  [pt confirms she uses [facility]] -- Intervention Not Indicated  [pt states she uses [facility]] Intervention Not Indicated Intervention Not Indicated  Utilities Interventions Intervention Not Indicated -- -- -- Intervention Not Indicated Intervention Not Indicated  Alcohol Usage Interventions -- -- -- -- Intervention Not Indicated (Score <7) --  Depression Interventions/Treatment  -- -- Medication, Currently on Treatment -- -- --  Financial Strain Interventions -- -- -- -- Intervention Not Indicated --  Physical Activity Interventions -- -- -- -- Intervention Not Indicated --  Stress Interventions -- -- -- -- Intervention Not Indicated --  Social Connections Interventions -- -- -- -- Intervention Not Indicated --        Goals Addressed             This Visit's Progress    TOC 30-day Program Care Plan   On track    Current Barriers:  Medication management  confirms uses compliance packaging; son assists with all aspects of medication management Provider appointments 07/26/23- PCP; 08/02/23- pulmonary provider; 08/07/23- ID provider (new patient- for resistant bacteria in urine noted on 07/18/23); 08/09/23- cardiology provider- new patient appointment Home Health services reports has been established with [facility] services "for  several years now" Equipment/DME see DME notes in [facility] template: patient currently has pending orders through [employer] for: bariatric hospital bed; electric wheelchair; also reports has new order for new CPAP machine- reports expects CPAP machine to "arrive any day now;" reports she is using a friends "borrowed hover-round" currently, prn for assistance with mobility; reports difficulty getting her current standard wheelchair through door frames in her home Established with [facility] for transportation services Fragile state of health, multiple progressing chronic health conditions- reports has been bed-bound since "2021;" reports "doctors are not sure what happened to cause me to become the way I am--- I used to be completely independent until I got sick in 2021" Total care patient: has applied for medicaid/ CAPS program: reports spoke to her caseworker yesterday, who told her, "you are number 13 on our list- so services will be starting sooner than later;" confirms she has contact information for medicaid/ CAPS referral caseworker- confirms she stays in touch with case worker "regularly" 07/27/23: Patient tells me she has maintained communication with medicaid "people;" but reports that she is frustrated due to "talking to different people all the time;" encouraged her to obtain a point-person/ contact information to avoid getting conflicting information- she will do this; states that she was told "just this week that I am now number 9 on the wait list" 3 inpatient hospitalizations x last 12 months:  at time of last hospital discharge on 07/17/23--- PCP messaged patient on 07/18/23 that her urine culture resulted with a resistant bacteria- PCP made referral for ID provider, given patient's multiple documented allergies to antibiotics:  verified the appointment is approximately 3 weeks away-- on 08/07/23-- will message PCP to make her aware of same 07/27/23: reports today that during her virtual office visit with PCP on  07/26/23, PCP advised that urgent ID appointment is not indicated: PCP reported to patient that the infection that "caused" the hospitalization was "cleared up" during the hospital visit; patient reports PCP advised to still see the ID provider as scheduled on 08/07/23- but "no need" for antibiotics "now" Reports has "small, dime-sized" open bedsore starting on "the right side of my butt;" need to ensure proper ongoing skin care/ assessments by home health team, currently established in patient's care 07/27/23:  patient reports area "is now completely healed;" reports this was verified by home health RN who visited patient's home today: confirmed home health nurse also drew blood and obtained urine for lab testing as per PCP orders for same 07/27/23:  DME update- confirmed bariatric hospital bed was delivered on Monday 07/24/23; confirmed patient also continuing to regularly communicate with PCP re: need for appropriate motorized wheelchair according to the limitations of her home- this remains pending as of today but review of EHR clearly indicates that PCP team is actively working on this; patient clarified today that her prior report that CPAP was to be delivered is actually what sounds like a in-home sleep study- she confirms plans to attend pulmonary provider office visit next week as scheduled on 08/02/23  -- 08/03/23: reports that her "electric lift" has been delivered and she/ caregiver are now using -- 08/03/23: reports her existing "hover-round" electric wheelchair has malfunctioned, which caused her  to miss/ cancel scheduled 08/02/23 pulmonary provider office visit: states she is waiting for the replacement part and she assures me that it "will arrive soon;" and she will be able to attend 08/07/23 ID provider office visit and 08/09/23 cardiology provider office visit -- 08/11/23: reports hover- round remains non-functional;  reports she has contacted [employer] today- she tells me that they are bringing her a  new hover-round "soon;" states she is not sure exactly when; confirms she has contact information for [employer]  RNCM Clinical Goal(s):  Patient will work with the Care Management team over the next 30 days to address Transition of Care Barriers: Support at home Home Health services Equipment/DME Need for ongoing support around clinical condition/ care at home in total care patient take all medications exactly as prescribed and will call provider for medication related questions as evidenced by review of same with patient and caregiver during weekly TOC 30-day program outreach calls attend all scheduled medical appointments: as noted above as evidenced by review of same with patient and caregiver- son during [facility] 30-day program weekly outreach calls  through collaboration with Medical illustrator, provider, and care team.   Interventions: Evaluation of current treatment plan related to  self management and patient's adherence to plan as established by provider  Transitions of Care:  Goal on track:  Yes. 08/11/23 Durable Medical Equipment (DME) needs assessed with patient/caregiver Doctor Visits  - discussed the importance of doctor visits Reviewed Signs and symptoms of infection Discussed current clinical condition:  "I am fine, no changes.  I was asleep when you called yesterday.  The video visit with the infection doctor went great!  He explained everything so well, said no need for taking antibiotics on a regular basis, I felt good about that.  The hover- round part did not fix it, so I called [employer] today and they said they would be bringing a new one to me soon, not sure when.  The home health team has all been here this week, the nurse, the OT, and the PT- the PT is coming back this afternoon.  Nurse says my skin looks fine, she had no concerns.  I don't know what happened with the heart doctor appointment on 08/09/23- I really don't think I cancelled that appointment, I don't know what happened.  I  don't think I need a Child psychotherapist to help me figure out this medicaid stuff with the personal care services, but I will think about it and let you know next week when you call."  Confirmed continues using home O2- continues using "all the time" Reviewed recent ID provider virtual visit on 08/07/23: verbalizes good understanding of same; denies questions/ concerns post-recent ID provider virtual Discussed cancelled cardiology office visit which was scheduled on 08/09/23: appointment documentation indicates that patient cancelled this visit; however, she tells me today, that she "is sure" she nor her son cancelled the visit; however-- she clearly did not remember this appointment at all, prior to my mentioning it; she tells me she will re-schedule this "soon;" denies need for assistance in doing so Notes from last outreach 08/03/23:  Apparently patient only has to use the hover-round for her appointments that [facility] bus takes her to- I suggested she also call her insurance provider to arrange transportation for next week's appointments, just in case the hover-round does not get fixed in time for the appointments; reminded patient that the insurance company requires several days in advanced to schedule, and encouraged her to schedule today  for next week's appointment-- she can always cancel the transportation if the hover-round gets repaired: she is agreeable to this and states she will do Reviewed upcoming provider office visits as above: confirmed patient is aware of all and has plans to attend as scheduled 08/13/23- reports has at home sleep study to perform; confirms she has all of the equipment and denies questions about performing this in-home visit 09/11/23- endocrinology provider appointment Update on medicaid PCS waiting list obtained from patient-  she again reports she and her son contact them each week for updates, but states they "skipped" calling this week; I offered to place LCSW referral to assist  her in this process- she again denies need for additional assistance around this  Patient Goals/Self-Care Activities: Participate in Transition of Care Program/Attend TOC scheduled calls Take all medications as prescribed Attend all scheduled provider appointments Call provider office for new concerns or questions  Continue working with the home health team that is involved in your care Stay in touch with the medicaid team you have been working with, to determine the status of your request for personal care services Please continue regularly monitoring and caring for your skin condition- make sure that the home health nurse evaluates your bottom and your skin with every home visit Continue using home oxygen as prescribed Use assistive devices as needed to prevent falls If you believe your condition is getting worse- contact your care providers (doctors) promptly- reaching out to your doctor early when you have concerns can prevent you from having to go to the hospital  Follow Up Plan:  Telephone follow up appointment with care management team member scheduled for:  Friday 08/18/23 at 2:00 pm    Plan for next week call: Review sleep study home visit from 08/13/23 Check to see if patient re-scheduled cancelled cardiology office visit from 08/09/23 Get update on home health services ([facility])- ensure skin assessment with every home visit Update on pending DME: motorized wheelchair; assess whether hover- round has been replaced by [employer] as patient reported on 08/11/23 Update on medicaid PCS status- caseworker: refer LCSW as indicated TOC 30-day program case closure if no hospital readmission- refer to longitudinal RN CM       Plan: Telephone follow up appointment with care management team member scheduled for:   Friday 08/18/23 at 2:00 pm  Total time spent from review to signing of note/ including any care coordination interventions:  47 minutes  Pls call/ message for questions,  Caryl Pina, RN, BSN, Media planner  Transitions of Care  [facility] - [facility] [phone]: direct office

## 2023-08-14 ENCOUNTER — Ambulatory Visit (INDEPENDENT_AMBULATORY_CARE_PROVIDER_SITE_OTHER): Admitting: Clinical

## 2023-08-14 DIAGNOSIS — M797 Fibromyalgia: Secondary | ICD-10-CM | POA: Diagnosis not present

## 2023-08-14 DIAGNOSIS — G894 Chronic pain syndrome: Secondary | ICD-10-CM | POA: Diagnosis not present

## 2023-08-14 DIAGNOSIS — F330 Major depressive disorder, recurrent, mild: Secondary | ICD-10-CM | POA: Diagnosis not present

## 2023-08-14 DIAGNOSIS — F419 Anxiety disorder, unspecified: Secondary | ICD-10-CM | POA: Diagnosis not present

## 2023-08-14 DIAGNOSIS — G629 Polyneuropathy, unspecified: Secondary | ICD-10-CM | POA: Diagnosis not present

## 2023-08-14 DIAGNOSIS — M4802 Spinal stenosis, cervical region: Secondary | ICD-10-CM | POA: Diagnosis not present

## 2023-08-14 DIAGNOSIS — E876 Hypokalemia: Secondary | ICD-10-CM | POA: Diagnosis not present

## 2023-08-14 DIAGNOSIS — K760 Fatty (change of) liver, not elsewhere classified: Secondary | ICD-10-CM | POA: Diagnosis not present

## 2023-08-14 DIAGNOSIS — K56609 Unspecified intestinal obstruction, unspecified as to partial versus complete obstruction: Secondary | ICD-10-CM | POA: Diagnosis not present

## 2023-08-14 DIAGNOSIS — G9529 Other cord compression: Secondary | ICD-10-CM | POA: Diagnosis not present

## 2023-08-14 DIAGNOSIS — K219 Gastro-esophageal reflux disease without esophagitis: Secondary | ICD-10-CM | POA: Diagnosis not present

## 2023-08-14 DIAGNOSIS — I5033 Acute on chronic diastolic (congestive) heart failure: Secondary | ICD-10-CM | POA: Diagnosis not present

## 2023-08-14 DIAGNOSIS — E559 Vitamin D deficiency, unspecified: Secondary | ICD-10-CM | POA: Diagnosis not present

## 2023-08-14 DIAGNOSIS — I872 Venous insufficiency (chronic) (peripheral): Secondary | ICD-10-CM | POA: Diagnosis not present

## 2023-08-14 DIAGNOSIS — J45909 Unspecified asthma, uncomplicated: Secondary | ICD-10-CM | POA: Diagnosis not present

## 2023-08-14 DIAGNOSIS — L400 Psoriasis vulgaris: Secondary | ICD-10-CM | POA: Diagnosis not present

## 2023-08-14 DIAGNOSIS — E662 Morbid (severe) obesity with alveolar hypoventilation: Secondary | ICD-10-CM

## 2023-08-14 DIAGNOSIS — G43909 Migraine, unspecified, not intractable, without status migrainosus: Secondary | ICD-10-CM | POA: Diagnosis not present

## 2023-08-14 DIAGNOSIS — N390 Urinary tract infection, site not specified: Secondary | ICD-10-CM | POA: Diagnosis not present

## 2023-08-14 DIAGNOSIS — M300 Polyarteritis nodosa: Secondary | ICD-10-CM | POA: Diagnosis not present

## 2023-08-14 DIAGNOSIS — Z6841 Body Mass Index (BMI) 40.0 and over, adult: Secondary | ICD-10-CM

## 2023-08-14 DIAGNOSIS — E785 Hyperlipidemia, unspecified: Secondary | ICD-10-CM | POA: Diagnosis not present

## 2023-08-14 DIAGNOSIS — G4733 Obstructive sleep apnea (adult) (pediatric): Secondary | ICD-10-CM | POA: Diagnosis not present

## 2023-08-14 DIAGNOSIS — E538 Deficiency of other specified B group vitamins: Secondary | ICD-10-CM | POA: Diagnosis not present

## 2023-08-14 DIAGNOSIS — I110 Hypertensive heart disease with heart failure: Secondary | ICD-10-CM | POA: Diagnosis not present

## 2023-08-14 MED ORDER — GABAPENTIN 600 MG TABLET
ORAL_TABLET | Freq: Three times a day (TID) | ORAL | 3 refills | 90 days
Start: 2023-08-14 — End: ?

## 2023-08-14 NOTE — Telephone Encounter (Signed)
 Will need an appt for power chair evaluation in order to complete paperwork

## 2023-08-14 NOTE — Telephone Encounter (Signed)
 Faxed and placed in scan bin

## 2023-08-14 NOTE — Progress Notes (Signed)
 [facility] Counselor/Therapist Progress Note  Patient ID: [name], MRN: [id number],    Date: 08/14/2023  Time Spent: 9:43am - 10:23am : 40 minutes   Treatment Type: Individual Therapy  Reported Symptoms: Patient reported she has been experiencing panic attacks and feeling on edge  Mental Status Exam: Appearance:  Neat and Well Groomed     Behavior: Appropriate  Motor: Patient is bed bound  Speech/Language:  Garbled  Affect: Appropriate  Mood: anxious  Thought process: tangential  Thought content:   Tangential  Sensory/Perceptual disturbances:   WNL  Orientation: oriented to person, place, and situation  Attention: Good  Concentration: Good  Memory: WNL  Fund of knowledge:  Good  Insight:   Good  Judgment:  Good  Impulse Control: Good   Risk Assessment: Danger to Self:  No Patient denied current suicidal ideation  Self-injurious Behavior: No Danger to Others: No Patient denied current homicidal ideation Duty to Warn:no Physical Aggression / Violence:No  Access to Firearms a concern: No  Gang Involvement:No   Subjective: Patient reported she was in the hospital recently. Patient reported during hospital admission she was given fentanyl and reported she experienced hallucinations in response. Patient stated, "It was awful" in response to hallucinations. Patient reported she has experienced nightmares and panic attacks since returning from the hospital. Patient reported paramedics have been to patient's home twice due to panic attacks. Patient reported she was experiencing difficulty breathing when patient's son called the paramedics. Patient stated, "every little thing makes me edgy". Patient reported she is on the waiting list for in home care services through the department of social services.  Patient stated, "mainly just talking through it" in response to coping strategies patient has utilized in the past in response to anxiety. Patient stated, "that is so  helpful" in response to deep breathing exercises.   Interventions: Cognitive Behavioral Therapy. Clinician conducted session via caregility video from clinician's home office. Patient provided verbal consent to proceed with telehealth session and is aware of limitations of telephone or video visits. Patient participated in session from patient's home. Patient's son was present in the room during today's session and patient provided verbal consent for son to be present during session. Discussed recent missed appointment. Reviewed events since last session. Discussed recent symptoms of anxiety/panic. Explored coping strategies patient has utilized in response to anxiety/panic attacks. Provided psycho education related to deep breathing exercises and clinician guided patient in practicing deep breathing during session.    Patient was delayed in logging into today's session. Clinician contacted patient via phone to assist with any difficulties logging into today's session. Patient reported she would try again to log onto today's session.   Collaboration of Care: Other not required at this time   Diagnosis:  Mild episode of recurrent major depressive disorder (HCC)   Anxiety disorder, unspecified type     Plan: Patient is to utilize [employer] Therapy, thought re-framing, behavioral activation, relaxation techniques, mindfulness and coping strategies to decrease symptoms associated with their diagnosis. Frequency: weekly  Modality: individual      Long-term goal:   Reduce overall level, frequency, and intensity of the feelings of depression and anxiety as evidenced by decreased irritability, sadness, depressed mood, loss of interest, lack of energy, fatigue, increased sleep, decreased concentration, anxiety, anger, ruminating thoughts, and loss of motivation from 7 days/week to 0 to 1 days/week per patient report for at least 3 consecutive months. Target Date: 07/09/24  Progress: progressing     Short-term goal:  Decrease feelings of anger towards patient's father Target Date: 01/07/24  Progress: progressing    Identify triggers for anger/irritability and develop coping strategies to utilize to reduce verbal outbursts per patient's report  Target Date: 01/07/24  Progress: progressing    Develop and implement effective communication strategies for patient to utilize when expressing her thoughts and feelings to others in a controlled and assertive way  Target Date: 01/07/24  Progress: progressing    Identify, challenge, and replace negative core beliefs, thought patterns, and negative self talk that contribute to feelings of depression, anxiety, anger, guilt, decision making, and self esteem with positive thoughts, beliefs, and positive self talk per patient's report Target Date: 01/07/24  Progress: progressing     Doree Barthel, LCSW

## 2023-08-14 NOTE — Progress Notes (Signed)
   Erica Barthel, LCSW

## 2023-08-14 NOTE — Telephone Encounter (Signed)
 Forms completed and returned to [place]

## 2023-08-14 NOTE — Telephone Encounter (Signed)
 Form completed and returned to [place]

## 2023-08-14 NOTE — Telephone Encounter (Signed)
 Faxed

## 2023-08-14 NOTE — Telephone Encounter (Signed)
 Were we able to find pt a ride in office?

## 2023-08-15 DIAGNOSIS — K760 Fatty (change of) liver, not elsewhere classified: Secondary | ICD-10-CM | POA: Diagnosis not present

## 2023-08-15 DIAGNOSIS — E538 Deficiency of other specified B group vitamins: Secondary | ICD-10-CM | POA: Diagnosis not present

## 2023-08-15 DIAGNOSIS — K219 Gastro-esophageal reflux disease without esophagitis: Secondary | ICD-10-CM | POA: Diagnosis not present

## 2023-08-15 DIAGNOSIS — E559 Vitamin D deficiency, unspecified: Secondary | ICD-10-CM | POA: Diagnosis not present

## 2023-08-15 DIAGNOSIS — J45909 Unspecified asthma, uncomplicated: Secondary | ICD-10-CM | POA: Diagnosis not present

## 2023-08-15 DIAGNOSIS — E785 Hyperlipidemia, unspecified: Secondary | ICD-10-CM | POA: Diagnosis not present

## 2023-08-15 DIAGNOSIS — M797 Fibromyalgia: Secondary | ICD-10-CM | POA: Diagnosis not present

## 2023-08-15 DIAGNOSIS — G9529 Other cord compression: Secondary | ICD-10-CM | POA: Diagnosis not present

## 2023-08-15 DIAGNOSIS — N390 Urinary tract infection, site not specified: Secondary | ICD-10-CM | POA: Diagnosis not present

## 2023-08-15 DIAGNOSIS — G629 Polyneuropathy, unspecified: Secondary | ICD-10-CM | POA: Diagnosis not present

## 2023-08-15 DIAGNOSIS — K56609 Unspecified intestinal obstruction, unspecified as to partial versus complete obstruction: Secondary | ICD-10-CM | POA: Diagnosis not present

## 2023-08-15 DIAGNOSIS — M300 Polyarteritis nodosa: Secondary | ICD-10-CM | POA: Diagnosis not present

## 2023-08-15 DIAGNOSIS — I5033 Acute on chronic diastolic (congestive) heart failure: Secondary | ICD-10-CM | POA: Diagnosis not present

## 2023-08-15 DIAGNOSIS — E876 Hypokalemia: Secondary | ICD-10-CM | POA: Diagnosis not present

## 2023-08-15 DIAGNOSIS — G4733 Obstructive sleep apnea (adult) (pediatric): Secondary | ICD-10-CM | POA: Diagnosis not present

## 2023-08-15 DIAGNOSIS — G894 Chronic pain syndrome: Secondary | ICD-10-CM | POA: Diagnosis not present

## 2023-08-15 DIAGNOSIS — L400 Psoriasis vulgaris: Secondary | ICD-10-CM | POA: Diagnosis not present

## 2023-08-15 DIAGNOSIS — G43909 Migraine, unspecified, not intractable, without status migrainosus: Secondary | ICD-10-CM | POA: Diagnosis not present

## 2023-08-15 DIAGNOSIS — I872 Venous insufficiency (chronic) (peripheral): Secondary | ICD-10-CM | POA: Diagnosis not present

## 2023-08-15 DIAGNOSIS — M4802 Spinal stenosis, cervical region: Secondary | ICD-10-CM | POA: Diagnosis not present

## 2023-08-15 DIAGNOSIS — I110 Hypertensive heart disease with heart failure: Secondary | ICD-10-CM | POA: Diagnosis not present

## 2023-08-16 ENCOUNTER — Telehealth: Payer: Self-pay | Admitting: Family Medicine

## 2023-08-16 ENCOUNTER — Telehealth: Payer: Self-pay

## 2023-08-16 DIAGNOSIS — E538 Deficiency of other specified B group vitamins: Secondary | ICD-10-CM | POA: Diagnosis not present

## 2023-08-16 DIAGNOSIS — M300 Polyarteritis nodosa: Secondary | ICD-10-CM | POA: Diagnosis not present

## 2023-08-16 DIAGNOSIS — K219 Gastro-esophageal reflux disease without esophagitis: Secondary | ICD-10-CM | POA: Diagnosis not present

## 2023-08-16 DIAGNOSIS — I872 Venous insufficiency (chronic) (peripheral): Secondary | ICD-10-CM | POA: Diagnosis not present

## 2023-08-16 DIAGNOSIS — L400 Psoriasis vulgaris: Secondary | ICD-10-CM | POA: Diagnosis not present

## 2023-08-16 DIAGNOSIS — E559 Vitamin D deficiency, unspecified: Secondary | ICD-10-CM | POA: Diagnosis not present

## 2023-08-16 DIAGNOSIS — N390 Urinary tract infection, site not specified: Secondary | ICD-10-CM | POA: Diagnosis not present

## 2023-08-16 DIAGNOSIS — G629 Polyneuropathy, unspecified: Secondary | ICD-10-CM | POA: Diagnosis not present

## 2023-08-16 DIAGNOSIS — G9529 Other cord compression: Secondary | ICD-10-CM | POA: Diagnosis not present

## 2023-08-16 DIAGNOSIS — J45909 Unspecified asthma, uncomplicated: Secondary | ICD-10-CM | POA: Diagnosis not present

## 2023-08-16 DIAGNOSIS — I5033 Acute on chronic diastolic (congestive) heart failure: Secondary | ICD-10-CM | POA: Diagnosis not present

## 2023-08-16 DIAGNOSIS — I110 Hypertensive heart disease with heart failure: Secondary | ICD-10-CM | POA: Diagnosis not present

## 2023-08-16 DIAGNOSIS — K56609 Unspecified intestinal obstruction, unspecified as to partial versus complete obstruction: Secondary | ICD-10-CM | POA: Diagnosis not present

## 2023-08-16 DIAGNOSIS — G894 Chronic pain syndrome: Secondary | ICD-10-CM | POA: Diagnosis not present

## 2023-08-16 DIAGNOSIS — E785 Hyperlipidemia, unspecified: Secondary | ICD-10-CM | POA: Diagnosis not present

## 2023-08-16 DIAGNOSIS — M4802 Spinal stenosis, cervical region: Secondary | ICD-10-CM | POA: Diagnosis not present

## 2023-08-16 DIAGNOSIS — G43909 Migraine, unspecified, not intractable, without status migrainosus: Secondary | ICD-10-CM | POA: Diagnosis not present

## 2023-08-16 DIAGNOSIS — E876 Hypokalemia: Secondary | ICD-10-CM | POA: Diagnosis not present

## 2023-08-16 DIAGNOSIS — G4733 Obstructive sleep apnea (adult) (pediatric): Secondary | ICD-10-CM | POA: Diagnosis not present

## 2023-08-16 DIAGNOSIS — M797 Fibromyalgia: Secondary | ICD-10-CM | POA: Diagnosis not present

## 2023-08-16 DIAGNOSIS — K760 Fatty (change of) liver, not elsewhere classified: Secondary | ICD-10-CM | POA: Diagnosis not present

## 2023-08-16 NOTE — Telephone Encounter (Signed)
 Placed in folder at nurse station

## 2023-08-16 NOTE — Telephone Encounter (Signed)
 Order # [id number] Need orders paper work is placed in providers mail box.  Please advise, Thanks  [facility] [employer]-- [phone]

## 2023-08-16 NOTE — Telephone Encounter (Signed)
 I have called [employer] to ask questions about the hover round on behalf of patient. I had to leave my information and they should be returning my call. When they do, if I am unavailable, please ask the agent/let the agent know that the patient is worried a hover round will not fit through her doorways in her home. Is a hover round her only option? Will there be someone coming out to her home to measure to ensure the chair will fit? Please update this message and me, if I can't take the call, when completed.

## 2023-08-18 ENCOUNTER — Other Ambulatory Visit: Payer: Self-pay | Admitting: *Deleted

## 2023-08-18 ENCOUNTER — Telehealth (HOSPITAL_BASED_OUTPATIENT_CLINIC_OR_DEPARTMENT_OTHER): Payer: Self-pay

## 2023-08-18 DIAGNOSIS — G9589 Other specified diseases of spinal cord: Secondary | ICD-10-CM

## 2023-08-18 NOTE — Patient Instructions (Addendum)
 Visit Information  Thank you for taking time to visit with me today. Please don't hesitate to contact me if I can be of assistance to you before our next scheduled telephone appointment.  I have sent a referral for a nurse care manager to contact you in the near future- when they call you, please engage with the nurse so she can assist with any care needs you have   Following are the goals we discussed today:  Patient Goals/Self-Care Activities: Take all medications as prescribed Attend all scheduled provider appointments Call provider office for new concerns or questions  Continue working with the home health team that is involved in your care Stay in touch with the medicaid team you have been working with, to determine the status of your request for personal care services Please continue regularly monitoring and caring for your skin condition- make sure that the home health nurse evaluates your bottom and your skin with every home visit Continue using home oxygen as prescribed Use assistive devices as needed to prevent falls If you believe your condition is getting worse- contact your care providers (doctors) promptly- reaching out to your doctor early when you have concerns can prevent you from having to go to the hospital  If you are experiencing a Mental Health or Behavioral Health Crisis or need someone to talk to, please  call the Suicide and Crisis Lifeline: 988 call the [place] National Suicide Prevention Lifeline: 309-736-3062 or TTY: 260-607-8508 TTY 667-348-7611) to talk to a trained counselor call 1-800-273-TALK (toll free, 24 hour hotline) go to [facility] [address], [place] [phone]) call the [facility] Crisis Line: [phone] call 911   Patient verbalizes understanding of instructions and care plan provided today and agrees to view in MyChart. Active MyChart status and patient understanding of how to access  instructions and care plan via MyChart confirmed with patient.     Pls call/ message for questions,  Caryl Pina, RN, BSN, CCRN Alumnus RN Care Manager  Transitions of Care  [facility] - [facility] [phone]: direct office

## 2023-08-18 NOTE — Telephone Encounter (Signed)
    I do not see any mention of ONO in last OV is this okay to order?  Copied from CRM [id number]. Topic: Clinical - Prescription Issue >> Aug 17, 2023 11:14 AM Isabell A wrote: Reason for CRM: Amy from [employer] calling in regard to to an RX that is needed for an O&O.   Callback number: [phone] Fax number: [phone]

## 2023-08-18 NOTE — Patient Outreach (Signed)
 Care Management  Transitions of Care Program Transitions of Care Post-discharge week # 5/ day # 30 - TOC 30-day program case closure   08/18/2023 Name: [name] MRN: [id number] DOB: 1970/12/06  Subjective: [name] is a 53 y.o. year old female who is a primary care patient of [name], Helane Rima, MD. The Care Management team Engaged with patient by telephone to assess and address transitions of care needs.   Consent to Services:  Patient was given information about care management services, agreed to services, and gave verbal consent to participate.  Enrolled into TOC 30-day program:  07/19/23; case closure 08/18/23  TOC 30--day outreach completed; patient has successfully met/ accomplished her established goals for TOC 30-day program without hospital readmission and referral was sent for ongoing follow up with longitudinal RN CM   Assessment: "I am fine, no changes, feeling better today than most days.  Everything is good now with the hover-round- it is fixed.  Staying in touch with the medicaid people about the personal care services- they tell me that I am still on the list, I talk to them every week.  The home health people are still coming, the nurse told me on Wednesday that the sore on my bottom is still completely healed up without any more areas that look bad.  We are taking care of my skin and keeping it clean"    Denies clinical concerns and sounds to be in no distress throughout [facility] 30-day program outreach call today          SDOH Interventions    Flowsheet Row Telephone from 07/19/2023 in [place] POPULATION HE[facility] DEPARTMENT Telephone from 01/17/2023 in [facility] Community Care Coordination Video Visit from 01/09/2023 in [facility] Telephone from 11/25/2022 in [facility] Community Care Coordination Clinical Support from 11/02/2022 in [facility] [place] [facility] Telephone from  06/01/2022 in [facility] Care Coordination  SDOH Interventions        Food Insecurity Interventions Intervention Not Indicated Intervention Not Indicated -- Intervention Not Indicated Intervention Not Indicated Intervention Not Indicated  Housing Interventions Intervention Not Indicated  [lives in single family home with her adult son] -- -- -- Intervention Not Indicated Intervention Not Indicated  Transportation Interventions Intervention Not Indicated  [established with / uses [facility] regularly- at baseline] Intervention Not Indicated  [pt confirms she uses [facility]] -- Intervention Not Indicated  [pt states she uses [facility]] Intervention Not Indicated Intervention Not Indicated  Utilities Interventions Intervention Not Indicated -- -- -- Intervention Not Indicated Intervention Not Indicated  Alcohol Usage Interventions -- -- -- -- Intervention Not Indicated (Score <7) --  Depression Interventions/Treatment  -- -- Medication, Currently on Treatment -- -- --  Financial Strain Interventions -- -- -- -- Intervention Not Indicated --  Physical Activity Interventions -- -- -- -- Intervention Not Indicated --  Stress Interventions -- -- -- -- Intervention Not Indicated --  Social Connections Interventions -- -- -- -- Intervention Not Indicated --        Goals Addressed             This Visit's Progress    TOC 30-day Program Care Plan   On track    Current Barriers:  Medication management confirms uses compliance packaging; son assists with all aspects of medication management Provider appointments 07/26/23- PCP; 08/02/23- pulmonary provider; 08/07/23- ID provider (new patient- for resistant bacteria in urine noted on 07/18/23); 08/09/23- cardiology provider-  new patient appointment Home Health services reports has been established with [facility] "for several years now" Equipment/DME see DME notes in [facility] template: patient currently has pending orders through [employer]  for: bariatric hospital bed; electric wheelchair; also reports has new order for new CPAP machine- reports expects CPAP machine to "arrive any day now;" reports she is using a friends "borrowed hover-round" currently, prn for assistance with mobility; reports difficulty getting her current standard wheelchair through door frames in her home Established with [facility] for transportation services 08/18/23: Patient again reports using established [facility] services for provider "most" appointments- states they will not transport her to PCP office- so she uses insurance transportation through [employer] for PCP in-person office visits; she often does virtual visits Fragile state of health, multiple progressing chronic health conditions- reports has been bed-bound since "2021;" reports "doctors are not sure what happened to cause me to become the way I am--- I used to be completely independent until I got sick in 2021" Total care patient: has applied for medicaid/ CAPS program: reports spoke to her caseworker yesterday, who told her, "you are number 13 on our list- so services will be starting sooner than later;" confirms she has contact information for medicaid/ CAPS referral caseworker- confirms she stays in touch with case worker "regularly" 07/27/23: Patient tells me she has maintained communication with medicaid "people;" but reports that she is frustrated due to "talking to different people all the time;" encouraged her to obtain a point-person/ contact information to avoid getting conflicting information- she will do this; states that she was told "just this week that I am now number 9 on the wait list" 08/18/23: Patient provides update today that she has maintained contact with medicaid caseworker who continues to tell her she is eligible and on waiting list for personal care services- confirms she has phone number for caseworker and "talks to them every week;" she again denies need for additional assistance  around this 3 inpatient hospitalizations x last 12 months:  at time of last hospital discharge on 07/17/23--- PCP messaged patient on 07/18/23 that her urine culture resulted with a resistant bacteria- PCP made referral for ID provider, given patient's multiple documented allergies to antibiotics:  verified the appointment is approximately 3 weeks away-- on 08/07/23-- will message PCP to make her aware of same 07/27/23: reports today that during her virtual office visit with PCP on 07/26/23, PCP advised that urgent ID appointment is not indicated: PCP reported to patient that the infection that "caused" the hospitalization was "cleared up" during the hospital visit; patient reports PCP advised to still see the ID provider as scheduled on 08/07/23- but "no need" for antibiotics "now" 08/18/23: re-reviewed recent ID provider office visit 08/07/23- attended virtually; confirmed she obtained and is using premarin vaginal cream as prescribed Reports has "small, dime-sized" open bedsore starting on "the right side of my butt;" need to ensure proper ongoing skin care/ assessments by home health team, currently established in patient's care 07/27/23:  patient reports area "is now completely healed;" reports this was verified by home health RN who visited patient's home today: confirmed home health nurse also drew blood and obtained urine for lab testing as per PCP orders for same 08/18/23:  patient again reports area remains "completely healed;" per home health nurse report "this week;" continues to report that she and her son "are taking good care of my bottom, there are no sores" 07/27/23:  DME update- confirmed bariatric hospital bed was delivered on  Monday 07/24/23; confirmed patient also continuing to regularly communicate with PCP re: need for appropriate motorized wheelchair according to the limitations of her home- this remains pending as of today but review of EHR clearly indicates that PCP team is actively working  on this; patient clarified today that her prior report that CPAP was to be delivered is actually what sounds like a in-home sleep study- she confirms plans to attend pulmonary provider office visit next week as scheduled on 08/02/23  -- 08/03/23: reports that her "electric lift" has been delivered and she/ caregiver are now using -- 08/03/23: reports her existing "hover-round" electric wheelchair has malfunctioned, which caused her to miss/ cancel scheduled 08/02/23 pulmonary provider office visit: states she is waiting for the replacement part and she assures me that it "will arrive soon;" and she will be able to attend 08/07/23 ID provider office visit and 08/09/23 cardiology provider office visit -- 08/11/23: reports hover- round remains non-functional;  reports she has contacted [employer] today- she tells me that they are bringing her a new hover-round "soon;" states she is not sure exactly when; confirms she has contact information for [employer] -- 08/18/23: reports hover- round is now fixed and she is now using at home without problems  RNCM Clinical Goal(s):  Patient will work with the Care Management team over the next 30 days to address Transition of Care Barriers: Support at home Home Health services Equipment/DME Need for ongoing support around clinical condition/ care at home in total care patient take all medications exactly as prescribed and will call provider for medication related questions as evidenced by review of same with patient and caregiver during weekly TOC 30-day program outreach calls attend all scheduled medical appointments: as noted above as evidenced by review of same with patient and caregiver- son during [facility] 30-day program weekly outreach calls  through collaboration with Medical illustrator, provider, and care team.   Interventions: Evaluation of current treatment plan related to  self management and patient's adherence to plan as established by provider  Transitions of Care:   Goal Met. 08/18/23 Durable Medical Equipment (DME) needs assessed with patient/caregiver Doctor Visits  - discussed the importance of doctor visits Reviewed Signs and symptoms of infection Discussed current clinical condition:  "I am fine, no changes, feeling better today than most days.  Everything is good now with the hover-round- it is fixed.  Staying in touch with the medicaid people about the personal care services- they tell me that I am still on the list, I talk to them every week.  The home health people are still coming, the nurse told me on Wednesday that the sore on my bottom is still completely healed up without any more areas that look bad.  We are taking care of my skin and keeping it clean"  Denies clinical concerns and sounds to be in no distress throughout [facility] 30-day program outreach call today Confirmed completed 08/13/23 home sleep study- awaiting results Confirmed continues using home O2- continues using "all the time;" denies concerns around shortness of breath outside of baseline Again discussed cancelled cardiology office visit which was scheduled on 08/09/23: appointment documentation indicates that patient cancelled this visit; however, she again tells me today, that she "did not cancel it;" she tells me she has not been able to re-schedule as of today but will re-schedule this "soon;" denies need for assistance in doing so Reviewed upcoming provider office visits - confirmed patient is aware of all and has plans to attend as  scheduled 09/11/23- pulmonology provider appointment Update on medicaid PCS waiting list obtained from patient as above in barriers-  she again reports she and her son contact them each week for updates-- she again denies need for additional assistance around this  Patient Goals/Self-Care Activities: Take all medications as prescribed Attend all scheduled provider appointments Call provider office for new concerns or questions  Continue working with the  home health team that is involved in your care Stay in touch with the medicaid team you have been working with, to determine the status of your request for personal care services Please continue regularly monitoring and caring for your skin condition- make sure that the home health nurse evaluates your bottom and your skin with every home visit Continue using home oxygen as prescribed Use assistive devices as needed to prevent falls If you believe your condition is getting worse- contact your care providers (doctors) promptly- reaching out to your doctor early when you have concerns can prevent you from having to go to the hospital  Follow Up Plan:   TOC 30--day outreach completed; patient has successfully met/ accomplished her established goals for [facility] 30-day program without hospital readmission and referral was sent for ongoing follow up with longitudinal RN CM         Plan: TOC 30--day outreach completed; patient has successfully met/ accomplished her established goals for [facility] 30-day program without hospital readmission and referral was sent for ongoing follow up with longitudinal RN CM   Total time spent from review to signing of note/ including any care coordination interventions: 48 minutes: complex patient, requires extra time; completion of complex care plan with referral to longitudinal RN CM  Pls call/ message for questions,  Caryl Pina, RN, BSN, Media planner  Transitions of Care  [facility] [phone]: direct office

## 2023-08-21 ENCOUNTER — Telehealth: Payer: Self-pay | Admitting: *Deleted

## 2023-08-21 NOTE — Telephone Encounter (Signed)
 Reached out to [employer] again and was asked to leave another message for an agent to return my call. I did specify that I had called and left a vm already and this has been going on for a long time. I was told they had no control over that and hopefully someone would be in touch soon.

## 2023-08-21 NOTE — Telephone Encounter (Signed)
 Spoke with Coretta at [facility] and she states she does not see an order for a home sleep test only for a ONO.

## 2023-08-21 NOTE — Progress Notes (Unsigned)
 Complex Care Management Note Care Guide Note  08/21/2023 Name: [name] MRN: [id number] DOB: [birth year]   Complex Care Management Outreach Attempts: An unsuccessful telephone outreach was attempted today to offer the patient information about available complex care management services.  Follow Up Plan:  Additional outreach attempts will be made to offer the patient complex care management information and services.   Encounter Outcome:  No Answer  Burman Nieves, CMA [place]  [facility], [facility] Guide Direct Dial: [phone]  Fax: [phone] Website: [email or web address]

## 2023-08-21 NOTE — Telephone Encounter (Signed)
 Please clarify with [employer] that the original order on 06/13/23 was for a home sleep study and not an ONO.

## 2023-08-22 ENCOUNTER — Other Ambulatory Visit: Payer: Self-pay | Admitting: Family Medicine

## 2023-08-22 DIAGNOSIS — I110 Hypertensive heart disease with heart failure: Secondary | ICD-10-CM | POA: Diagnosis not present

## 2023-08-22 DIAGNOSIS — N390 Urinary tract infection, site not specified: Secondary | ICD-10-CM | POA: Diagnosis not present

## 2023-08-22 DIAGNOSIS — M797 Fibromyalgia: Secondary | ICD-10-CM | POA: Diagnosis not present

## 2023-08-22 DIAGNOSIS — G9529 Other cord compression: Secondary | ICD-10-CM | POA: Diagnosis not present

## 2023-08-22 DIAGNOSIS — E538 Deficiency of other specified B group vitamins: Secondary | ICD-10-CM | POA: Diagnosis not present

## 2023-08-22 DIAGNOSIS — G894 Chronic pain syndrome: Secondary | ICD-10-CM | POA: Diagnosis not present

## 2023-08-22 DIAGNOSIS — J45909 Unspecified asthma, uncomplicated: Secondary | ICD-10-CM | POA: Diagnosis not present

## 2023-08-22 DIAGNOSIS — E559 Vitamin D deficiency, unspecified: Secondary | ICD-10-CM | POA: Diagnosis not present

## 2023-08-22 DIAGNOSIS — M4802 Spinal stenosis, cervical region: Secondary | ICD-10-CM | POA: Diagnosis not present

## 2023-08-22 DIAGNOSIS — G629 Polyneuropathy, unspecified: Secondary | ICD-10-CM | POA: Diagnosis not present

## 2023-08-22 DIAGNOSIS — K760 Fatty (change of) liver, not elsewhere classified: Secondary | ICD-10-CM | POA: Diagnosis not present

## 2023-08-22 DIAGNOSIS — G43909 Migraine, unspecified, not intractable, without status migrainosus: Secondary | ICD-10-CM | POA: Diagnosis not present

## 2023-08-22 DIAGNOSIS — L400 Psoriasis vulgaris: Secondary | ICD-10-CM | POA: Diagnosis not present

## 2023-08-22 DIAGNOSIS — K56609 Unspecified intestinal obstruction, unspecified as to partial versus complete obstruction: Secondary | ICD-10-CM | POA: Diagnosis not present

## 2023-08-22 DIAGNOSIS — I872 Venous insufficiency (chronic) (peripheral): Secondary | ICD-10-CM | POA: Diagnosis not present

## 2023-08-22 DIAGNOSIS — E785 Hyperlipidemia, unspecified: Secondary | ICD-10-CM | POA: Diagnosis not present

## 2023-08-22 DIAGNOSIS — E876 Hypokalemia: Secondary | ICD-10-CM | POA: Diagnosis not present

## 2023-08-22 DIAGNOSIS — I5033 Acute on chronic diastolic (congestive) heart failure: Secondary | ICD-10-CM | POA: Diagnosis not present

## 2023-08-22 DIAGNOSIS — M300 Polyarteritis nodosa: Secondary | ICD-10-CM | POA: Diagnosis not present

## 2023-08-22 DIAGNOSIS — K219 Gastro-esophageal reflux disease without esophagitis: Secondary | ICD-10-CM | POA: Diagnosis not present

## 2023-08-22 DIAGNOSIS — G4733 Obstructive sleep apnea (adult) (pediatric): Secondary | ICD-10-CM | POA: Diagnosis not present

## 2023-08-22 NOTE — Telephone Encounter (Signed)
 Form completed and returned to [place]

## 2023-08-22 NOTE — Telephone Encounter (Signed)
 faxed

## 2023-08-22 NOTE — Telephone Encounter (Addendum)
 The patient was contacted by [facility] on 07/11/23 and is aware of the number to call in order to proceed with the SNAP study. However, SNAP has not received a return call from the patient. SNAP attempted to follow up again in March multiple times, but the patient has not responded. I sent  a MyChart message to the patient with the callback number to move forward with the Home Sleep Test study.

## 2023-08-22 NOTE — Progress Notes (Unsigned)
 Complex Care Management Note Care Guide Note  08/22/2023 Name: [name] MRN: [id number] DOB: [birth year]   Complex Care Management Outreach Attempts: A second unsuccessful outreach was attempted today to offer the patient with information about available complex care management services.  Follow Up Plan:  Additional outreach attempts will be made to offer the patient complex care management information and services.   Encounter Outcome:  No Answer  Burman Nieves, CMA [place]  [facility], [facility] Guide Direct Dial: [phone]  Fax: [phone] Website: [email or web address]

## 2023-08-22 NOTE — Telephone Encounter (Signed)
 Left a Voicemail for [facility] for more information on the SNAP study. Will update when I get the information.

## 2023-08-23 NOTE — Progress Notes (Signed)
 Complex Care Management Note Care Guide Note  08/23/2023 Name: [name] MRN: [id number] DOB: 06-16-1970   Complex Care Management Outreach Attempts: A third unsuccessful outreach was attempted today to offer the patient with information about available complex care management services.  Follow Up Plan:  No further outreach attempts will be made at this time. We have been unable to contact the patient to offer or enroll patient in complex care management services.  Encounter Outcome:  No Answer  Burman Nieves, CMA [place]  [facility], [facility] Guide Direct Dial: [phone]  Fax: [phone] Website: [email or web address]

## 2023-08-24 ENCOUNTER — Telehealth: Payer: Self-pay | Admitting: Family Medicine

## 2023-08-24 DIAGNOSIS — E871 Hypo-osmolality and hyponatremia: Secondary | ICD-10-CM | POA: Diagnosis not present

## 2023-08-24 DIAGNOSIS — G4733 Obstructive sleep apnea (adult) (pediatric): Secondary | ICD-10-CM | POA: Diagnosis not present

## 2023-08-24 DIAGNOSIS — J45901 Unspecified asthma with (acute) exacerbation: Secondary | ICD-10-CM | POA: Diagnosis not present

## 2023-08-24 DIAGNOSIS — A419 Sepsis, unspecified organism: Secondary | ICD-10-CM | POA: Diagnosis not present

## 2023-08-24 NOTE — Telephone Encounter (Signed)
 [facility] [phone]-- PHONE  [phone]-- [facility] health is requesting orders to be signed and faxed back to its designated place.  Please advise, Thanks

## 2023-08-24 NOTE — Telephone Encounter (Signed)
 Left vm for pt to call office back and schedule an appt. This will need 40 min appt.

## 2023-08-24 NOTE — Telephone Encounter (Signed)
 Placed in folder at nurse station

## 2023-08-25 NOTE — Telephone Encounter (Signed)
 Faxed

## 2023-08-25 NOTE — Telephone Encounter (Signed)
 Signed and returned to [place]

## 2023-08-27 DIAGNOSIS — L89892 Pressure ulcer of other site, stage 2: Secondary | ICD-10-CM | POA: Diagnosis not present

## 2023-08-28 ENCOUNTER — Telehealth: Payer: Self-pay

## 2023-08-28 NOTE — Telephone Encounter (Signed)
 Appt made 09/14/2023

## 2023-08-28 NOTE — Telephone Encounter (Signed)
 Called the insurance and set up transportation for the 1st of May, Ride ID # [id number] and return Ride ID # [id number]

## 2023-08-28 NOTE — Telephone Encounter (Signed)
Placed in sign folder. 

## 2023-08-28 NOTE — Telephone Encounter (Signed)
 Type of form received: [facility] -for review  Additional comments: No return required  Received by: Tonita Frater  Form should be Faxed/mailed to: (address/ fax #)  Is patient requesting call for pickup:  Form placed:    Attach charge sheet.  Provider will determine charge.  Individual made aware of 3-5 business day turn around No?

## 2023-08-28 NOTE — Telephone Encounter (Signed)
 Placed in sign folder for review and sign

## 2023-08-28 NOTE — Telephone Encounter (Signed)
 Type of form received: Order request   Additional comments: [employer] request   Received by: Karon Packer -Fax  Form should be Faxed/mailed to: [phone]  Is patient requesting call for pickup: no  Form placed: provider pick up folder -front office  Attach charge sheet.  Provider will determine charge.  Individual made aware of 3-5 business day turn around No?

## 2023-08-29 ENCOUNTER — Ambulatory Visit (INDEPENDENT_AMBULATORY_CARE_PROVIDER_SITE_OTHER): Admitting: Clinical

## 2023-08-29 DIAGNOSIS — K760 Fatty (change of) liver, not elsewhere classified: Secondary | ICD-10-CM | POA: Diagnosis not present

## 2023-08-29 DIAGNOSIS — E876 Hypokalemia: Secondary | ICD-10-CM | POA: Diagnosis not present

## 2023-08-29 DIAGNOSIS — F419 Anxiety disorder, unspecified: Secondary | ICD-10-CM | POA: Diagnosis not present

## 2023-08-29 DIAGNOSIS — L400 Psoriasis vulgaris: Secondary | ICD-10-CM | POA: Diagnosis not present

## 2023-08-29 DIAGNOSIS — E785 Hyperlipidemia, unspecified: Secondary | ICD-10-CM | POA: Diagnosis not present

## 2023-08-29 DIAGNOSIS — M797 Fibromyalgia: Secondary | ICD-10-CM | POA: Diagnosis not present

## 2023-08-29 DIAGNOSIS — E559 Vitamin D deficiency, unspecified: Secondary | ICD-10-CM | POA: Diagnosis not present

## 2023-08-29 DIAGNOSIS — I110 Hypertensive heart disease with heart failure: Secondary | ICD-10-CM | POA: Diagnosis not present

## 2023-08-29 DIAGNOSIS — F330 Major depressive disorder, recurrent, mild: Secondary | ICD-10-CM

## 2023-08-29 DIAGNOSIS — I872 Venous insufficiency (chronic) (peripheral): Secondary | ICD-10-CM | POA: Diagnosis not present

## 2023-08-29 DIAGNOSIS — G9529 Other cord compression: Secondary | ICD-10-CM | POA: Diagnosis not present

## 2023-08-29 DIAGNOSIS — G629 Polyneuropathy, unspecified: Secondary | ICD-10-CM | POA: Diagnosis not present

## 2023-08-29 DIAGNOSIS — M4802 Spinal stenosis, cervical region: Secondary | ICD-10-CM | POA: Diagnosis not present

## 2023-08-29 DIAGNOSIS — M300 Polyarteritis nodosa: Secondary | ICD-10-CM | POA: Diagnosis not present

## 2023-08-29 DIAGNOSIS — K56609 Unspecified intestinal obstruction, unspecified as to partial versus complete obstruction: Secondary | ICD-10-CM | POA: Diagnosis not present

## 2023-08-29 DIAGNOSIS — K219 Gastro-esophageal reflux disease without esophagitis: Secondary | ICD-10-CM | POA: Diagnosis not present

## 2023-08-29 DIAGNOSIS — G4733 Obstructive sleep apnea (adult) (pediatric): Secondary | ICD-10-CM | POA: Diagnosis not present

## 2023-08-29 DIAGNOSIS — E538 Deficiency of other specified B group vitamins: Secondary | ICD-10-CM | POA: Diagnosis not present

## 2023-08-29 DIAGNOSIS — G43909 Migraine, unspecified, not intractable, without status migrainosus: Secondary | ICD-10-CM | POA: Diagnosis not present

## 2023-08-29 DIAGNOSIS — G894 Chronic pain syndrome: Secondary | ICD-10-CM | POA: Diagnosis not present

## 2023-08-29 DIAGNOSIS — I5033 Acute on chronic diastolic (congestive) heart failure: Secondary | ICD-10-CM | POA: Diagnosis not present

## 2023-08-29 DIAGNOSIS — J45909 Unspecified asthma, uncomplicated: Secondary | ICD-10-CM | POA: Diagnosis not present

## 2023-08-29 DIAGNOSIS — N390 Urinary tract infection, site not specified: Secondary | ICD-10-CM | POA: Diagnosis not present

## 2023-08-29 NOTE — Progress Notes (Signed)
 [facility] Counselor/Therapist Progress Note  Patient ID: [name], MRN: [id number],    Date: 08/29/2023  Time Spent: 10:33am - 11:16am : 43 minutes   Treatment Type: Individual Therapy  Reported Symptoms: Patient reported feelings of panic while logging onto today's appointment  Mental Status Exam: Appearance:  Neat and Well Groomed     Behavior: Appropriate  Motor: Normal  Speech/Language:  Garbled  Affect: Appropriate  Mood: normal  Thought process: normal  Thought content:   WNL  Sensory/Perceptual disturbances:   WNL  Orientation: oriented to person, place, and situation  Attention: Good  Concentration: Good  Memory: WNL  Fund of knowledge:  Good  Insight:   Good  Judgment:  Good  Impulse Control: Good   Risk Assessment: Danger to Self:  No Patient denied current suicidal ideation  Self-injurious Behavior: No Danger to Others: No Patient denied current homicidal ideation Duty to Warn:no Physical Aggression / Violence:No  Access to Firearms a concern: No  Gang Involvement:No   Subjective: Patient stated, "they've been going good". Patient reported feeling she was going to have a panic attack while waiting for today's appointment. Patient reported experiencing negative self talk in response to technical difficulties during virtual appointments.  Patient reported experiencing the thought "man you're stupid" and reported feeling sad in response to the thought. During today's session patient practiced identifying evidence for/against negative thoughts. Patient reported no facts to support the thought in response to technical difficulties. Patient stated, "I'm generally educated", able to make phone calls, takes care of paperwork/bills, was able to log onto to today's session, reaches out to others as evidence against negative thought associated with technical difficulties. Patient reported she has experienced a decrease in panic attacks and symptoms of  anxiety since last session. Patient stated,  "I am wonderfully and beautifully made" in response to identifying a positive self talk statement. Patient stated, "yes, I can do that" in response to use of positive self talk.   Interventions: Cognitive Behavioral Therapy. Clinician conducted session via caregility video from clinician's office at [facility]. Patient provided verbal consent to proceed with telehealth session and is aware of limitations of telephone or video visits. Patient participated in session from patient's home. Reviewed events since last session and assessed for changes. Explored and identified triggers for feelings of panic today. Provided psycho education related to negative self talk and cognitive restructuring/challenging negative thoughts/cognitive distortions. Assisted patient in practicing challenging thoughts/identifying evidence for/against thoughts. Provided psycho education related to use of positive self talk. Assisted patient in identifying statements for positive self talk. Clinician requested for homework patient practice challenging negative thoughts and practice use of positive self talk.    Collaboration of Care: Other not required at this time   Diagnosis:  Mild episode of recurrent major depressive disorder (HCC)   Anxiety disorder, unspecified type     Plan: Patient is to utilize [employer], thought re-framing, behavioral activation, relaxation techniques, mindfulness and coping strategies to decrease symptoms associated with their diagnosis. Frequency: weekly  Modality: individual      Long-term goal:   Reduce overall level, frequency, and intensity of the feelings of depression and anxiety as evidenced by decreased irritability, sadness, depressed mood, loss of interest, lack of energy, fatigue, increased sleep, decreased concentration, anxiety, anger, ruminating thoughts, and loss of motivation from 7 days/week to 0 to 1  days/week per patient report for at least 3 consecutive months. Target Date: 07/09/24  Progress: progressing    Short-term  goal:  Decrease feelings of anger towards patient's father Target Date: 01/07/24  Progress: progressing    Identify triggers for anger/irritability and develop coping strategies to utilize to reduce verbal outbursts per patient's report  Target Date: 01/07/24  Progress: progressing    Develop and implement effective communication strategies for patient to utilize when expressing her thoughts and feelings to others in a controlled and assertive way  Target Date: 01/07/24  Progress: progressing    Identify, challenge, and replace negative core beliefs, thought patterns, and negative self talk that contribute to feelings of depression, anxiety, anger, guilt, decision making, and self esteem with positive thoughts, beliefs, and positive self talk per patient's report Target Date: 01/07/24  Progress: progressing     Burlene Carpen, LCSW

## 2023-08-29 NOTE — Progress Notes (Signed)
   Doree Barthel, LCSW

## 2023-08-30 ENCOUNTER — Telehealth: Payer: Self-pay

## 2023-08-30 DIAGNOSIS — E538 Deficiency of other specified B group vitamins: Secondary | ICD-10-CM | POA: Diagnosis not present

## 2023-08-30 DIAGNOSIS — I5033 Acute on chronic diastolic (congestive) heart failure: Secondary | ICD-10-CM | POA: Diagnosis not present

## 2023-08-30 DIAGNOSIS — G9529 Other cord compression: Secondary | ICD-10-CM | POA: Diagnosis not present

## 2023-08-30 DIAGNOSIS — K760 Fatty (change of) liver, not elsewhere classified: Secondary | ICD-10-CM | POA: Diagnosis not present

## 2023-08-30 DIAGNOSIS — N390 Urinary tract infection, site not specified: Secondary | ICD-10-CM | POA: Diagnosis not present

## 2023-08-30 DIAGNOSIS — G894 Chronic pain syndrome: Secondary | ICD-10-CM | POA: Diagnosis not present

## 2023-08-30 DIAGNOSIS — K219 Gastro-esophageal reflux disease without esophagitis: Secondary | ICD-10-CM | POA: Diagnosis not present

## 2023-08-30 DIAGNOSIS — E785 Hyperlipidemia, unspecified: Secondary | ICD-10-CM | POA: Diagnosis not present

## 2023-08-30 DIAGNOSIS — E559 Vitamin D deficiency, unspecified: Secondary | ICD-10-CM | POA: Diagnosis not present

## 2023-08-30 DIAGNOSIS — J45909 Unspecified asthma, uncomplicated: Secondary | ICD-10-CM | POA: Diagnosis not present

## 2023-08-30 DIAGNOSIS — M797 Fibromyalgia: Secondary | ICD-10-CM | POA: Diagnosis not present

## 2023-08-30 DIAGNOSIS — G4733 Obstructive sleep apnea (adult) (pediatric): Secondary | ICD-10-CM | POA: Diagnosis not present

## 2023-08-30 DIAGNOSIS — G43909 Migraine, unspecified, not intractable, without status migrainosus: Secondary | ICD-10-CM | POA: Diagnosis not present

## 2023-08-30 DIAGNOSIS — K56609 Unspecified intestinal obstruction, unspecified as to partial versus complete obstruction: Secondary | ICD-10-CM | POA: Diagnosis not present

## 2023-08-30 DIAGNOSIS — M4802 Spinal stenosis, cervical region: Secondary | ICD-10-CM | POA: Diagnosis not present

## 2023-08-30 DIAGNOSIS — L400 Psoriasis vulgaris: Secondary | ICD-10-CM | POA: Diagnosis not present

## 2023-08-30 DIAGNOSIS — M300 Polyarteritis nodosa: Secondary | ICD-10-CM | POA: Diagnosis not present

## 2023-08-30 DIAGNOSIS — I872 Venous insufficiency (chronic) (peripheral): Secondary | ICD-10-CM | POA: Diagnosis not present

## 2023-08-30 DIAGNOSIS — G629 Polyneuropathy, unspecified: Secondary | ICD-10-CM | POA: Diagnosis not present

## 2023-08-30 DIAGNOSIS — I110 Hypertensive heart disease with heart failure: Secondary | ICD-10-CM | POA: Diagnosis not present

## 2023-08-30 DIAGNOSIS — E876 Hypokalemia: Secondary | ICD-10-CM | POA: Diagnosis not present

## 2023-08-30 NOTE — Telephone Encounter (Signed)
 Copied from CRM [id number]. Topic: General - Other >> Aug 30, 2023 11:46 AM Chuck Crater wrote: Reason for CRM: [name] stated that there is come confusion with status changes and is requesting a callback from Dr. Darilyn Edin nurse.

## 2023-08-30 NOTE — Telephone Encounter (Signed)
 Copied from CRM [id number]. Topic: General - Other >> Aug 30, 2023  2:48 PM Kita Perish H wrote: Reason for CRM: Patient is returning call to [facility] please reach back out, thanks.  [place] [phone]

## 2023-08-31 NOTE — Telephone Encounter (Signed)
 [facility] and she reported a concern for patient, notes she is "not herself" reports that she is "zoning out" difficulty sleeping for the patient, she feels patient needs a rehab facility or other intervention and that her being at home currently is a risk to her health.   [name] asked us  to follow up with her if we want labs drawn or other orders entered before her appt here on 09/14/2023

## 2023-09-04 ENCOUNTER — Telehealth: Payer: Self-pay | Admitting: Family Medicine

## 2023-09-04 DIAGNOSIS — E538 Deficiency of other specified B group vitamins: Secondary | ICD-10-CM | POA: Diagnosis not present

## 2023-09-04 DIAGNOSIS — L400 Psoriasis vulgaris: Secondary | ICD-10-CM | POA: Diagnosis not present

## 2023-09-04 DIAGNOSIS — G629 Polyneuropathy, unspecified: Secondary | ICD-10-CM | POA: Diagnosis not present

## 2023-09-04 DIAGNOSIS — M4802 Spinal stenosis, cervical region: Secondary | ICD-10-CM | POA: Diagnosis not present

## 2023-09-04 DIAGNOSIS — N390 Urinary tract infection, site not specified: Secondary | ICD-10-CM | POA: Diagnosis not present

## 2023-09-04 DIAGNOSIS — G4733 Obstructive sleep apnea (adult) (pediatric): Secondary | ICD-10-CM | POA: Diagnosis not present

## 2023-09-04 DIAGNOSIS — G9529 Other cord compression: Secondary | ICD-10-CM | POA: Diagnosis not present

## 2023-09-04 DIAGNOSIS — M797 Fibromyalgia: Secondary | ICD-10-CM | POA: Diagnosis not present

## 2023-09-04 DIAGNOSIS — E785 Hyperlipidemia, unspecified: Secondary | ICD-10-CM | POA: Diagnosis not present

## 2023-09-04 DIAGNOSIS — E876 Hypokalemia: Secondary | ICD-10-CM | POA: Diagnosis not present

## 2023-09-04 DIAGNOSIS — K219 Gastro-esophageal reflux disease without esophagitis: Secondary | ICD-10-CM | POA: Diagnosis not present

## 2023-09-04 DIAGNOSIS — G43909 Migraine, unspecified, not intractable, without status migrainosus: Secondary | ICD-10-CM | POA: Diagnosis not present

## 2023-09-04 DIAGNOSIS — K56609 Unspecified intestinal obstruction, unspecified as to partial versus complete obstruction: Secondary | ICD-10-CM | POA: Diagnosis not present

## 2023-09-04 DIAGNOSIS — G894 Chronic pain syndrome: Secondary | ICD-10-CM | POA: Diagnosis not present

## 2023-09-04 DIAGNOSIS — E559 Vitamin D deficiency, unspecified: Secondary | ICD-10-CM | POA: Diagnosis not present

## 2023-09-04 DIAGNOSIS — I872 Venous insufficiency (chronic) (peripheral): Secondary | ICD-10-CM | POA: Diagnosis not present

## 2023-09-04 DIAGNOSIS — I110 Hypertensive heart disease with heart failure: Secondary | ICD-10-CM | POA: Diagnosis not present

## 2023-09-04 DIAGNOSIS — K760 Fatty (change of) liver, not elsewhere classified: Secondary | ICD-10-CM | POA: Diagnosis not present

## 2023-09-04 DIAGNOSIS — M300 Polyarteritis nodosa: Secondary | ICD-10-CM | POA: Diagnosis not present

## 2023-09-04 DIAGNOSIS — I5033 Acute on chronic diastolic (congestive) heart failure: Secondary | ICD-10-CM | POA: Diagnosis not present

## 2023-09-04 DIAGNOSIS — J45909 Unspecified asthma, uncomplicated: Secondary | ICD-10-CM | POA: Diagnosis not present

## 2023-09-04 NOTE — Telephone Encounter (Signed)
 Placed in folder at nurse station

## 2023-09-04 NOTE — Telephone Encounter (Signed)
 Pt has been advised  Updated [facility] nurse on advise

## 2023-09-04 NOTE — Telephone Encounter (Signed)
 [facility] faxed over orders that need to be looked over, signed and faxed back to its fax # [phone]-- paper work placed in providers box.   Please advise, Thanks

## 2023-09-04 NOTE — Telephone Encounter (Signed)
 If she is having mental status changes and not acting like herself, she needs to go to the ER for a complete evaluation.  I agree that her current living situation is problematic and poses a health/safety risk.  Unfortunately, unless she is willing to go to a SNF or rehab, she will remain where she is.

## 2023-09-05 ENCOUNTER — Other Ambulatory Visit: Payer: Self-pay

## 2023-09-05 ENCOUNTER — Telehealth: Payer: Self-pay

## 2023-09-05 ENCOUNTER — Emergency Department (HOSPITAL_COMMUNITY)

## 2023-09-05 ENCOUNTER — Inpatient Hospital Stay (HOSPITAL_COMMUNITY)
Admission: EM | Admit: 2023-09-05 | Discharge: 2023-09-14 | DRG: 291 | Disposition: E | Attending: Internal Medicine | Admitting: Internal Medicine

## 2023-09-05 ENCOUNTER — Encounter (HOSPITAL_COMMUNITY): Payer: Self-pay | Admitting: Emergency Medicine

## 2023-09-05 DIAGNOSIS — I5033 Acute on chronic diastolic (congestive) heart failure: Secondary | ICD-10-CM | POA: Diagnosis not present

## 2023-09-05 DIAGNOSIS — Z79899 Other long term (current) drug therapy: Secondary | ICD-10-CM

## 2023-09-05 DIAGNOSIS — R5381 Other malaise: Secondary | ICD-10-CM | POA: Diagnosis present

## 2023-09-05 DIAGNOSIS — E785 Hyperlipidemia, unspecified: Secondary | ICD-10-CM | POA: Diagnosis not present

## 2023-09-05 DIAGNOSIS — E662 Morbid (severe) obesity with alveolar hypoventilation: Secondary | ICD-10-CM | POA: Diagnosis not present

## 2023-09-05 DIAGNOSIS — R9389 Abnormal findings on diagnostic imaging of other specified body structures: Secondary | ICD-10-CM | POA: Diagnosis not present

## 2023-09-05 DIAGNOSIS — Z8619 Personal history of other infectious and parasitic diseases: Secondary | ICD-10-CM

## 2023-09-05 DIAGNOSIS — F419 Anxiety disorder, unspecified: Secondary | ICD-10-CM | POA: Diagnosis not present

## 2023-09-05 DIAGNOSIS — Z1624 Resistance to multiple antibiotics: Secondary | ICD-10-CM | POA: Diagnosis not present

## 2023-09-05 DIAGNOSIS — I468 Cardiac arrest due to other underlying condition: Secondary | ICD-10-CM | POA: Diagnosis not present

## 2023-09-05 DIAGNOSIS — Z1152 Encounter for screening for COVID-19: Secondary | ICD-10-CM | POA: Diagnosis not present

## 2023-09-05 DIAGNOSIS — Z66 Do not resuscitate: Secondary | ICD-10-CM | POA: Diagnosis not present

## 2023-09-05 DIAGNOSIS — R0902 Hypoxemia: Secondary | ICD-10-CM | POA: Diagnosis not present

## 2023-09-05 DIAGNOSIS — Z825 Family history of asthma and other chronic lower respiratory diseases: Secondary | ICD-10-CM

## 2023-09-05 DIAGNOSIS — Z882 Allergy status to sulfonamides status: Secondary | ICD-10-CM

## 2023-09-05 DIAGNOSIS — N3000 Acute cystitis without hematuria: Secondary | ICD-10-CM | POA: Diagnosis present

## 2023-09-05 DIAGNOSIS — G9341 Metabolic encephalopathy: Secondary | ICD-10-CM | POA: Diagnosis not present

## 2023-09-05 DIAGNOSIS — J810 Acute pulmonary edema: Principal | ICD-10-CM

## 2023-09-05 DIAGNOSIS — J309 Allergic rhinitis, unspecified: Secondary | ICD-10-CM | POA: Diagnosis present

## 2023-09-05 DIAGNOSIS — Z8249 Family history of ischemic heart disease and other diseases of the circulatory system: Secondary | ICD-10-CM | POA: Diagnosis not present

## 2023-09-05 DIAGNOSIS — F32A Depression, unspecified: Secondary | ICD-10-CM | POA: Diagnosis not present

## 2023-09-05 DIAGNOSIS — J9811 Atelectasis: Secondary | ICD-10-CM | POA: Diagnosis not present

## 2023-09-05 DIAGNOSIS — G4733 Obstructive sleep apnea (adult) (pediatric): Secondary | ICD-10-CM | POA: Diagnosis present

## 2023-09-05 DIAGNOSIS — R0609 Other forms of dyspnea: Secondary | ICD-10-CM | POA: Diagnosis not present

## 2023-09-05 DIAGNOSIS — L89892 Pressure ulcer of other site, stage 2: Secondary | ICD-10-CM | POA: Diagnosis not present

## 2023-09-05 DIAGNOSIS — Z90710 Acquired absence of both cervix and uterus: Secondary | ICD-10-CM

## 2023-09-05 DIAGNOSIS — R54 Age-related physical debility: Secondary | ICD-10-CM | POA: Diagnosis not present

## 2023-09-05 DIAGNOSIS — Z7401 Bed confinement status: Secondary | ICD-10-CM

## 2023-09-05 DIAGNOSIS — Z881 Allergy status to other antibiotic agents status: Secondary | ICD-10-CM

## 2023-09-05 DIAGNOSIS — Z887 Allergy status to serum and vaccine status: Secondary | ICD-10-CM

## 2023-09-05 DIAGNOSIS — R410 Disorientation, unspecified: Secondary | ICD-10-CM | POA: Diagnosis not present

## 2023-09-05 DIAGNOSIS — Z9884 Bariatric surgery status: Secondary | ICD-10-CM | POA: Diagnosis not present

## 2023-09-05 DIAGNOSIS — G894 Chronic pain syndrome: Secondary | ICD-10-CM | POA: Diagnosis not present

## 2023-09-05 DIAGNOSIS — K219 Gastro-esophageal reflux disease without esophagitis: Secondary | ICD-10-CM | POA: Diagnosis not present

## 2023-09-05 DIAGNOSIS — Z886 Allergy status to analgesic agent status: Secondary | ICD-10-CM

## 2023-09-05 DIAGNOSIS — Z6841 Body Mass Index (BMI) 40.0 and over, adult: Secondary | ICD-10-CM

## 2023-09-05 DIAGNOSIS — G8929 Other chronic pain: Secondary | ICD-10-CM | POA: Diagnosis present

## 2023-09-05 DIAGNOSIS — I110 Hypertensive heart disease with heart failure: Secondary | ICD-10-CM | POA: Diagnosis not present

## 2023-09-05 DIAGNOSIS — R0989 Other specified symptoms and signs involving the circulatory and respiratory systems: Secondary | ICD-10-CM | POA: Diagnosis not present

## 2023-09-05 DIAGNOSIS — I10 Essential (primary) hypertension: Secondary | ICD-10-CM | POA: Diagnosis not present

## 2023-09-05 DIAGNOSIS — J9611 Chronic respiratory failure with hypoxia: Secondary | ICD-10-CM | POA: Diagnosis present

## 2023-09-05 DIAGNOSIS — Z7951 Long term (current) use of inhaled steroids: Secondary | ICD-10-CM

## 2023-09-05 DIAGNOSIS — Z888 Allergy status to other drugs, medicaments and biological substances status: Secondary | ICD-10-CM

## 2023-09-05 DIAGNOSIS — Z9981 Dependence on supplemental oxygen: Secondary | ICD-10-CM

## 2023-09-05 DIAGNOSIS — Z8541 Personal history of malignant neoplasm of cervix uteri: Secondary | ICD-10-CM

## 2023-09-05 DIAGNOSIS — Z9049 Acquired absence of other specified parts of digestive tract: Secondary | ICD-10-CM

## 2023-09-05 LAB — I-STAT VENOUS BLOOD GAS, ED
Acid-Base Excess: 19 mmol/L — ABNORMAL HIGH (ref 0.0–2.0)
Bicarbonate: 44.9 mmol/L — ABNORMAL HIGH (ref 20.0–28.0)
Calcium, Ion: 1.04 mmol/L — ABNORMAL LOW (ref 1.15–1.40)
HCT: 37 % (ref 36.0–46.0)
Hemoglobin: 12.6 g/dL (ref 12.0–15.0)
O2 Saturation: 90 %
Potassium: 4.1 mmol/L (ref 3.5–5.1)
Sodium: 133 mmol/L — ABNORMAL LOW (ref 135–145)
TCO2: 46 mmol/L — ABNORMAL HIGH (ref 22–32)
pCO2, Ven: 53.1 mmHg (ref 44–60)
pH, Ven: 7.535 — ABNORMAL HIGH (ref 7.25–7.43)
pO2, Ven: 54 mmHg — ABNORMAL HIGH (ref 32–45)

## 2023-09-05 LAB — CBC WITH DIFFERENTIAL/PLATELET
Abs Immature Granulocytes: 0.03 10*3/uL (ref 0.00–0.07)
Basophils Absolute: 0 10*3/uL (ref 0.0–0.1)
Basophils Relative: 0 %
Eosinophils Absolute: 0.4 10*3/uL (ref 0.0–0.5)
Eosinophils Relative: 4 %
HCT: 36.7 % (ref 36.0–46.0)
Hemoglobin: 11.7 g/dL — ABNORMAL LOW (ref 12.0–15.0)
Immature Granulocytes: 0 %
Lymphocytes Relative: 12 %
Lymphs Abs: 1.2 10*3/uL (ref 0.7–4.0)
MCH: 30.9 pg (ref 26.0–34.0)
MCHC: 31.9 g/dL (ref 30.0–36.0)
MCV: 96.8 fL (ref 80.0–100.0)
Monocytes Absolute: 0.7 10*3/uL (ref 0.1–1.0)
Monocytes Relative: 7 %
Neutro Abs: 7.6 10*3/uL (ref 1.7–7.7)
Neutrophils Relative %: 77 %
Platelets: 232 10*3/uL (ref 150–400)
RBC: 3.79 MIL/uL — ABNORMAL LOW (ref 3.87–5.11)
RDW: 14.7 % (ref 11.5–15.5)
WBC: 10 10*3/uL (ref 4.0–10.5)
nRBC: 0 % (ref 0.0–0.2)

## 2023-09-05 LAB — BASIC METABOLIC PANEL WITH GFR
Anion gap: 8 (ref 5–15)
BUN: 7 mg/dL (ref 6–20)
CO2: 36 mmol/L — ABNORMAL HIGH (ref 22–32)
Calcium: 8.5 mg/dL — ABNORMAL LOW (ref 8.9–10.3)
Chloride: 90 mmol/L — ABNORMAL LOW (ref 98–111)
Creatinine, Ser: 0.55 mg/dL (ref 0.44–1.00)
GFR, Estimated: >60 mL/min (ref >60)
Glucose, Bld: 102 mg/dL — ABNORMAL HIGH (ref 70–99)
Potassium: 4.2 mmol/L (ref 3.5–5.1)
Sodium: 134 mmol/L — ABNORMAL LOW (ref 135–145)

## 2023-09-05 LAB — URINALYSIS, W/ REFLEX TO CULTURE (INFECTION SUSPECTED)
Bilirubin Urine: NEGATIVE
Glucose, UA: NEGATIVE mg/dL
Hgb urine dipstick: NEGATIVE
Ketones, ur: NEGATIVE mg/dL
Leukocytes,Ua: NEGATIVE
Nitrite: POSITIVE — AB
Protein, ur: NEGATIVE mg/dL
Specific Gravity, Urine: 1.009 (ref 1.005–1.030)
pH: 7 (ref 5.0–8.0)

## 2023-09-05 LAB — BRAIN NATRIURETIC PEPTIDE: B Natriuretic Peptide: 149.9 pg/mL — ABNORMAL HIGH (ref 0.0–100.0)

## 2023-09-05 MED ORDER — FUROSEMIDE 10 MG/ML IJ SOLN
40.0000 mg | Freq: Once | INTRAMUSCULAR | Status: AC
  Administered 2023-09-05: 40 mg via INTRAVENOUS
  Filled 2023-09-05: qty 4

## 2023-09-05 MED ORDER — PIPERACILLIN-TAZOBACTAM 3.375 G IVPB 30 MIN
3.3750 g | Freq: Once | INTRAVENOUS | Status: AC
Start: 2023-09-05 — End: 2023-09-05
  Administered 2023-09-05: 3.375 g via INTRAVENOUS
  Filled 2023-09-05: qty 50

## 2023-09-05 NOTE — Progress Notes (Signed)
 ED Pharmacy Antibiotic Sign Off An antibiotic consult was received from an ED provider for zosyn  per pharmacy dosing for UTI. A chart review was completed to assess appropriateness.   The following one time order(s) were placed:  Zosyn  3.375g   Further antibiotic and/or antibiotic pharmacy consults should be ordered by the admitting provider if indicated.   Thank you for allowing pharmacy to be a part of this patient's care.   Fonda Hymen, [facility]  Clinical Pharmacist 09/05/23 11:04 PM

## 2023-09-05 NOTE — Telephone Encounter (Signed)
See phone note.  This has been addressed.  ?

## 2023-09-05 NOTE — ED Triage Notes (Addendum)
 Pt BIB EMS from home with c/o AMS after waking up without her oxygen  in her nose. Per ems/family this happens occasionally and is no longer altered after a few minutes of having her O2 back on. Wears 2lpm [place] chronically. Pt A&O x 4 on arrival. Pt is bed bound. Son wants to speak to social work.

## 2023-09-05 NOTE — Telephone Encounter (Signed)
 Copied from CRM [id number]. Topic: General - Other >> Aug 31, 2023  1:34 PM [place] J wrote: Reason for CRM: Patient would like another call back from [place] due to missing initial call. No notes from [place] but there was a telephone encounter made regarding yesterday's concern. >> Sep 05, 2023 10:34 AM Chuck Crater wrote: Patient is requesting a callback from [facility] or Dr. Paulla Bossier. She states that she was told to call. Please give patient a call.

## 2023-09-05 NOTE — ED Provider Notes (Signed)
 [place] EMERGENCY DEPARTMENT AT [facility] Provider Note   CSN: [id number] Arrival date & time: 09/05/23  1914     History  Chief Complaint  Patient presents with   Altered Mental Status    [name] is a 53 y.o. female.  Patient is a 53 year old female with a past medical history of CHF, morbid obesity on 2 L home O2, [name] with CPAP at night, hypertension, GERD presenting to the emergency department with altered mental status.  Per EMS, son reported that she has had intermittent episodes of altered mental status when her oxygen  falls off of her nose when she is asleep.  They report this happened twice today.  He states that she gets dazed and out of it.  The patient states that she does remember these episodes.  She denies feeling any shortness of breath recently.  Denies any fevers, chest pain or cough.  Denies lower extremity swelling.  Denies any nausea, vomiting or diarrhea, dysuria or hematuria.  She states that she has been wearing her CPAP at night as prescribed.  EMS also reports that the son is concerned that he is not able to safely take care of her at home in she may need SNF.  The history is provided by the patient and the EMS personnel.  Altered Mental Status      Home Medications Prior to Admission medications   Medication Sig Start Date End Date Taking? Authorizing Provider  albuterol  (VENTOLIN  HFA) 108 (90 Base) MCG/ACT inhaler Inhale 1 puff into the lungs every 6 (six) hours as needed for wheezing or shortness of breath.    [provider]  ALPRAZolam  (XANAX ) 1 MG tablet TAKE 1 TABLET BY MOUTH 2 TIMES DAILY 07/05/23   [name], Katherine E, MD  ARIPiprazole  (ABILIFY ) 15 MG tablet TAKE 1 TABLET BY MOUTH EVERY DAY 06/27/23   [name], Katherine E, MD  Armodafinil  250 MG tablet Take 1 tablet (250 mg total) by mouth in the morning and at bedtime. 08/10/22   [name], Katherine E, MD  Biotin  10 MG TABS Take 1 tablet (10 mg total) by mouth in the morning.  02/10/23   [name], Katherine E, MD  buPROPion  (WELLBUTRIN  XL) 300 MG 24 hr tablet TAKE 1 TABLET BY MOUTH EVERY DAY 08/14/23   [name], Katherine E, MD  calcium  citrate (CALCITRATE - DOSED IN MG ELEMENTAL CALCIUM ) 950 (200 Ca) MG tablet TAKE 1 TABLET BY MOUTH ONCE DAILY 07/26/23   [name], Katherine E, MD  conjugated estrogens  (PREMARIN ) vaginal cream Apply vaginally once daily x 1 week then TWO times weekly to help with urinary symptoms 08/07/23   Ernie Heal, Jerelyn Money, MD  cyanocobalamin  (VITAMIN B12) 1000 MCG tablet TAKE 1 TABLET BY MOUTH ONCE DAILY 08/22/23   [name], Katherine E, MD  diclofenac  Sodium (VOLTAREN ) 1 % GEL Apply 4 g topically 4 (four) times daily. 08/10/22   Jess Morita, MD  DULoxetine  (CYMBALTA ) 60 MG capsule Take 2 capsules (120 mg total) by mouth daily. Patient taking differently: Take 60 mg by mouth 2 (two) times daily. 06/22/15   [name], Katherine E, MD  FEROSUL 325 (65 Fe) MG tablet TAKE 1 TABLET BY MOUTH EVERY MORNING WITH BREAKFAST 08/22/23   [name], Katherine E, MD  fluticasone -salmeterol (ADVAIR) 250-50 MCG/ACT AEPB Inhale 1 puff into the lungs every 12 (twelve) hours. 06/13/23   Quillian Brunt, MD  folic acid  (FOLVITE ) 1 MG tablet TAKE 1 TABLET BY MOUTH ONCE DAILY 08/22/23   [name], Katherine E,  MD  furosemide  (LASIX ) 40 MG tablet TAKE 1 TABLET BY MOUTH ONCE DAILY MAY TAKE AN ADDITIONAL TABLET BY MOUTH IF WEIGHT GAIN OF 5LBS AND CALL MD FOR FURTHER INSTRUCTIONS 07/26/23   Jess Morita, MD  gabapentin  (NEURONTIN ) 600 MG tablet Take 1 tablet (600 mg total) by mouth 2 (two) times daily as needed. Pt takes daily - can take up to twice daily 11/24/22   Daren Eck, DO  GNP VITAMIN B-1 100 MG tablet TAKE 1 TABLET BY MOUTH EVERY DAY 08/22/23   [name], Katherine E, MD  HYDROcodone -acetaminophen  (NORCO) 10-325 MG tablet TAKE 1 TABLET BY MOUTH EVERY 8 HOURS AS NEEDED 08/03/23   [name], Katherine E, MD  ipratropium-albuterol  (DUONEB) 0.5-2.5 (3) MG/3ML SOLN Take 3 mLs by nebulization  every 6 (six) hours as needed (wheezing/shortness of breath). 01/14/23   [place], Rema Care, DO  magnesium  oxide (MAG-OX) 400 (240 Mg) MG tablet Take 1 tablet by mouth daily. 06/20/23   [provider]  nebivolol  (BYSTOLIC ) 10 MG tablet TAKE 1 TABLET BY MOUTH EVERY DAY 08/14/23   [name], Katherine E, MD  nystatin  (MYCOSTATIN /NYSTOP ) powder APPLY 1 APPLICATION TOPICALLY 3 TIMES DAILY Patient taking differently: Apply 1 Application topically 3 (three) times daily as needed (yeast infection). 08/17/22   [name], Katherine E, MD  Oxcarbazepine  (TRILEPTAL ) 300 MG tablet Take 300 mg by mouth 2 (two) times daily.    [provider]  pantoprazole  (PROTONIX ) 40 MG tablet TAKE 1 TABLET BY MOUTH 2 TIMES DAILY 08/14/23   [name], Katherine E, MD  phentermine  37.5 MG capsule Take 1 capsule (37.5 mg total) by mouth every morning. 06/28/23   [name], Katherine E, MD  potassium chloride  (KLOR-CON ) 20 MEQ packet Take 1 packet by mouth daily. Take with lasix . 11/24/22 07/12/24  Daren Eck, DO  promethazine  (PHENERGAN ) 25 MG tablet TAKE 1 TABLET BY MOUTH EVERY 6 HOURS AS NEEDED FOR NAUSEA AND VOMITING 04/27/23   [name], Katherine E, MD  promethazine -dextromethorphan (PROMETHAZINE -DM) 6.25-15 MG/5ML syrup Take 5 mLs by mouth 4 (four) times daily as needed. Patient taking differently: Take 5 mLs by mouth 4 (four) times daily as needed for cough. 06/26/23   [name], Katherine E, MD  pyridOXINE  (VITAMIN B6) 100 MG tablet TAKE 1 TABLET BY MOUTH ONCE DAILY 08/22/23   [name], Katherine E, MD      Allergies    Erythromycin, Niacin, Sulfamethoxazole-trimethoprim, Aspirin, Benzoin compound, Cephalexin, Iohexol , Lisinopril, Naproxen, Pnu-imune [pneumococcal polysaccharide vaccine], and Sulfonamide derivatives    Review of Systems   Review of Systems  Physical Exam Updated Vital Signs BP 106/69   Pulse 85   Temp 98.4 F (36.9 C)   Resp 19   Ht 5\' 1"  (1.549 m)   Wt (!) 170 kg   SpO2 98%   BMI 70.81 kg/m  Physical  Exam Vitals and nursing note reviewed.  Constitutional:      General: She is not in acute distress.    Appearance: Normal appearance. She is obese.  HENT:     Head: Normocephalic and atraumatic.     Nose: Nose normal.     Mouth/Throat:     Mouth: Mucous membranes are moist.     Pharynx: Oropharynx is clear.  Eyes:     Extraocular Movements: Extraocular movements intact.     Conjunctiva/sclera: Conjunctivae normal.  Cardiovascular:     Rate and Rhythm: Normal rate and regular rhythm.     Heart sounds: Normal heart sounds.  Pulmonary:     Effort: Pulmonary effort is  normal.     Breath sounds: Normal breath sounds.  Abdominal:     General: Abdomen is flat.     Palpations: Abdomen is soft.     Tenderness: There is no abdominal tenderness.  Musculoskeletal:        General: Normal range of motion.     Cervical back: Normal range of motion.     Right lower leg: No edema.     Left lower leg: No edema.  Skin:    General: Skin is warm and dry.  Neurological:     General: No focal deficit present.     Mental Status: She is alert and oriented to person, place, and time.     Sensory: No sensory deficit.     Motor: No weakness.  Psychiatric:        Mood and Affect: Mood normal.        Behavior: Behavior normal.     ED Results / Procedures / Treatments   Labs (all labs ordered are listed, but only abnormal results are displayed) Labs Reviewed  CBC WITH DIFFERENTIAL/PLATELET - Abnormal; Notable for the following components:      Result Value   RBC 3.79 (*)    Hemoglobin 11.7 (*)    All other components within normal limits  BASIC METABOLIC PANEL WITH GFR - Abnormal; Notable for the following components:   Sodium 134 (*)    Chloride 90 (*)    CO2 36 (*)    Glucose, Bld 102 (*)    Calcium  8.5 (*)    All other components within normal limits  URINALYSIS, W/ REFLEX TO CULTURE (INFECTION SUSPECTED) - Abnormal; Notable for the following components:   Nitrite POSITIVE (*)     Bacteria, UA MANY (*)    All other components within normal limits  BRAIN NATRIURETIC PEPTIDE - Abnormal; Notable for the following components:   B Natriuretic Peptide 149.9 (*)    All other components within normal limits  I-STAT VENOUS BLOOD GAS, ED - Abnormal; Notable for the following components:   pH, Ven 7.535 (*)    pO2, Ven 54 (*)    Bicarbonate 44.9 (*)    TCO2 46 (*)    Acid-Base Excess 19.0 (*)    Sodium 133 (*)    Calcium , Ion 1.04 (*)    All other components within normal limits    EKG EKG Interpretation Date/Time:  Tuesday September 05 2023 19:25:55 EDT Ventricular Rate:  78 PR Interval:  164 QRS Duration:  93 QT Interval:  408 QTC Calculation: 465 R Axis:   61  Text Interpretation: Sinus rhythm Low voltage, precordial leads Abnormal T, consider ischemia, lateral leads Since last tracing of earlier today No significant change was found Confirmed by Celesta Coke ([id number]) on 09/05/2023 7:50:28 PM  Radiology DG Chest Port 1 View Result Date: 09/05/2023 CLINICAL DATA:  Hypoxia EXAM: PORTABLE CHEST 1 VIEW COMPARISON:  Chest x-ray 07/14/2023 FINDINGS: There is stable moderate elevation of the right hemidiaphragm with atelectasis in the right lung base. There is also a band of atelectasis in the retrocardiac region. The heart is enlarged there is central pulmonary vascular congestion. There is no pneumothorax or pleural effusion identified. No acute fractures are seen. IMPRESSION: 1. Cardiomegaly with central pulmonary vascular congestion. 2. Stable moderate elevation of the right hemidiaphragm with atelectasis in the right lung base. 3. Band of atelectasis in the retrocardiac region. Electronically Signed   By: Tyron Gallon M.D.   On: 09/05/2023 20:03  Procedures Procedures    Medications Ordered in ED Medications  furosemide  (LASIX ) injection 40 mg (40 mg Intravenous Given 09/05/23 2145)    ED Course/ Medical Decision Making/ A&P Clinical Course as of 09/05/23 2301   Tue Sep 05, 2023  2117 CXR with increased vascular congestion, will be given IV lasix . UA pending. Will add on BNP. [VK]  2255 Urine is nitrite positive with many bacteria concerning for UTI. She will be started on antibiotics and will be recommended admission in the setting of her AMS. Family also requesting TOC eval while inpatient for possible SNF. [VK]    Clinical Course User Index [VK] Kingsley, Tonya Wantz K, DO                                 Medical Decision Making This patient presents to the ED with chief complaint(s) of transient disorientation with pertinent past medical history of chronic hypoxic respiratory failure on 2 L nasal cannula, morbid obesity, [name], hypertension, GERD which further complicates the presenting complaint. The complaint involves an extensive differential diagnosis and also carries with it a high risk of complications and morbidity.    The differential diagnosis includes arrhythmia, anemia, electrolyte derangement, infection, hypercarbia, hypoxia, no focal neurologic deficits making CVA or TIA unlikely  Additional history obtained: Additional history obtained from EMS  Records reviewed Primary Care Documents  ED Course and Reassessment: On patient's arrival she is at her neurologic baseline, hemodynamically stable and in no acute distress satting well on her home O2.  Suspect her confusion is related to acute hypoxia however will have labs performed to evaluate for infection or other etiology of her confusion.  She will be closely reassessed.  Independent labs interpretation:  The following labs were independently interpreted: UA positive for UTI, mildly elevated BNP  Independent visualization of imaging: - I independently visualized the following imaging with scope of interpretation limited to determining acute life threatening conditions related to emergency care: CXR, which revealed pulmonary edema  Consultation: - Consulted or discussed management/test  interpretation w/ external professional: hospitalist  Consideration for admission or further workup: patient requires admission for treatment of multi-drug resistant UTI causing confusion and volume overload Social Determinants of health: N/A    Amount and/or Complexity of Data Reviewed Labs: ordered. Radiology: ordered.  Risk Prescription drug management. Decision regarding hospitalization.          Final Clinical Impression(s) / ED Diagnoses Final diagnoses:  Acute pulmonary edema (HCC)  Transient confusion  Acute cystitis without hematuria    Rx / DC Orders ED Discharge Orders     None         Kingsley, Maclane Holloran K, DO 09/05/23 2301

## 2023-09-06 ENCOUNTER — Encounter (HOSPITAL_COMMUNITY): Payer: Self-pay | Admitting: Family Medicine

## 2023-09-06 DIAGNOSIS — F32A Depression, unspecified: Secondary | ICD-10-CM | POA: Diagnosis present

## 2023-09-06 DIAGNOSIS — K219 Gastro-esophageal reflux disease without esophagitis: Secondary | ICD-10-CM | POA: Diagnosis present

## 2023-09-06 DIAGNOSIS — J309 Allergic rhinitis, unspecified: Secondary | ICD-10-CM | POA: Diagnosis present

## 2023-09-06 DIAGNOSIS — Z9981 Dependence on supplemental oxygen: Secondary | ICD-10-CM | POA: Diagnosis not present

## 2023-09-06 DIAGNOSIS — G9341 Metabolic encephalopathy: Secondary | ICD-10-CM | POA: Diagnosis present

## 2023-09-06 DIAGNOSIS — Z6841 Body Mass Index (BMI) 40.0 and over, adult: Secondary | ICD-10-CM | POA: Diagnosis not present

## 2023-09-06 DIAGNOSIS — Z1152 Encounter for screening for COVID-19: Secondary | ICD-10-CM | POA: Diagnosis not present

## 2023-09-06 DIAGNOSIS — F419 Anxiety disorder, unspecified: Secondary | ICD-10-CM | POA: Diagnosis present

## 2023-09-06 DIAGNOSIS — Z8249 Family history of ischemic heart disease and other diseases of the circulatory system: Secondary | ICD-10-CM | POA: Diagnosis not present

## 2023-09-06 DIAGNOSIS — Z8541 Personal history of malignant neoplasm of cervix uteri: Secondary | ICD-10-CM | POA: Diagnosis not present

## 2023-09-06 DIAGNOSIS — J810 Acute pulmonary edema: Secondary | ICD-10-CM | POA: Diagnosis present

## 2023-09-06 DIAGNOSIS — R0609 Other forms of dyspnea: Secondary | ICD-10-CM | POA: Diagnosis not present

## 2023-09-06 DIAGNOSIS — Z66 Do not resuscitate: Secondary | ICD-10-CM | POA: Diagnosis present

## 2023-09-06 DIAGNOSIS — R54 Age-related physical debility: Secondary | ICD-10-CM | POA: Diagnosis present

## 2023-09-06 DIAGNOSIS — Z1624 Resistance to multiple antibiotics: Secondary | ICD-10-CM | POA: Diagnosis present

## 2023-09-06 DIAGNOSIS — G8929 Other chronic pain: Secondary | ICD-10-CM | POA: Diagnosis present

## 2023-09-06 DIAGNOSIS — E785 Hyperlipidemia, unspecified: Secondary | ICD-10-CM | POA: Diagnosis present

## 2023-09-06 DIAGNOSIS — G894 Chronic pain syndrome: Secondary | ICD-10-CM | POA: Diagnosis present

## 2023-09-06 DIAGNOSIS — I468 Cardiac arrest due to other underlying condition: Secondary | ICD-10-CM | POA: Diagnosis not present

## 2023-09-06 DIAGNOSIS — J9611 Chronic respiratory failure with hypoxia: Secondary | ICD-10-CM | POA: Diagnosis present

## 2023-09-06 DIAGNOSIS — E662 Morbid (severe) obesity with alveolar hypoventilation: Secondary | ICD-10-CM | POA: Diagnosis present

## 2023-09-06 DIAGNOSIS — I5033 Acute on chronic diastolic (congestive) heart failure: Secondary | ICD-10-CM | POA: Diagnosis not present

## 2023-09-06 DIAGNOSIS — Z9884 Bariatric surgery status: Secondary | ICD-10-CM | POA: Diagnosis not present

## 2023-09-06 DIAGNOSIS — I110 Hypertensive heart disease with heart failure: Secondary | ICD-10-CM | POA: Diagnosis present

## 2023-09-06 DIAGNOSIS — N3000 Acute cystitis without hematuria: Secondary | ICD-10-CM | POA: Diagnosis present

## 2023-09-06 DIAGNOSIS — Z7401 Bed confinement status: Secondary | ICD-10-CM | POA: Diagnosis not present

## 2023-09-06 DIAGNOSIS — Z90710 Acquired absence of both cervix and uterus: Secondary | ICD-10-CM | POA: Diagnosis not present

## 2023-09-06 LAB — BASIC METABOLIC PANEL WITH GFR
Anion gap: 8 (ref 5–15)
BUN: 5 mg/dL — ABNORMAL LOW (ref 6–20)
CO2: 39 mmol/L — ABNORMAL HIGH (ref 22–32)
Calcium: 8.3 mg/dL — ABNORMAL LOW (ref 8.9–10.3)
Chloride: 86 mmol/L — ABNORMAL LOW (ref 98–111)
Creatinine, Ser: 0.62 mg/dL (ref 0.44–1.00)
GFR, Estimated: >60 mL/min (ref >60)
Glucose, Bld: 87 mg/dL (ref 70–99)
Potassium: 3.4 mmol/L — ABNORMAL LOW (ref 3.5–5.1)
Sodium: 133 mmol/L — ABNORMAL LOW (ref 135–145)

## 2023-09-06 LAB — RESP PANEL BY RT-PCR (RSV, FLU A&B, COVID)  RVPGX2
Influenza A by PCR: NEGATIVE
Influenza B by PCR: NEGATIVE
Resp Syncytial Virus by PCR: NEGATIVE
SARS Coronavirus 2 by RT PCR: NEGATIVE

## 2023-09-06 LAB — CBC
HCT: 38.1 % (ref 36.0–46.0)
Hemoglobin: 11.8 g/dL — ABNORMAL LOW (ref 12.0–15.0)
MCH: 29.5 pg (ref 26.0–34.0)
MCHC: 31 g/dL (ref 30.0–36.0)
MCV: 95.3 fL (ref 80.0–100.0)
Platelets: 257 10*3/uL (ref 150–400)
RBC: 4 MIL/uL (ref 3.87–5.11)
RDW: 14.5 % (ref 11.5–15.5)
WBC: 8 10*3/uL (ref 4.0–10.5)
nRBC: 0 % (ref 0.0–0.2)

## 2023-09-06 LAB — MAGNESIUM: Magnesium: 1.6 mg/dL — ABNORMAL LOW (ref 1.7–2.4)

## 2023-09-06 MED ORDER — ONDANSETRON HCL 4 MG/2ML IJ SOLN
4.0000 mg | Freq: Four times a day (QID) | INTRAMUSCULAR | Status: DC | PRN
Start: 2023-09-06 — End: 2023-09-08
  Administered 2023-09-06: 4 mg via INTRAVENOUS
  Filled 2023-09-06: qty 2

## 2023-09-06 MED ORDER — PANTOPRAZOLE SODIUM 40 MG PO TBEC
40.0000 mg | DELAYED_RELEASE_TABLET | Freq: Two times a day (BID) | ORAL | Status: DC
Start: 2023-09-06 — End: 2023-09-08
  Administered 2023-09-06 – 2023-09-07 (×4): 40 mg via ORAL
  Filled 2023-09-06 (×4): qty 1

## 2023-09-06 MED ORDER — ALPRAZOLAM 0.5 MG PO TABS
0.5000 mg | ORAL_TABLET | Freq: Two times a day (BID) | ORAL | Status: DC | PRN
  Administered 2023-09-07: 0.5 mg via ORAL
  Filled 2023-09-06: qty 1

## 2023-09-06 MED ORDER — CALCIUM CITRATE 950 (200 CA) MG PO TABS
1.0000 | ORAL_TABLET | Freq: Every day | ORAL | Status: DC
  Administered 2023-09-06 – 2023-09-07 (×2): 950 mg via ORAL
  Filled 2023-09-06 (×3): qty 1

## 2023-09-06 MED ORDER — MODAFINIL 100 MG PO TABS
200.0000 mg | ORAL_TABLET | Freq: Every day | ORAL | Status: DC
  Administered 2023-09-07: 200 mg via ORAL
  Filled 2023-09-06: qty 2

## 2023-09-06 MED ORDER — POTASSIUM CHLORIDE CRYS ER 20 MEQ PO TBCR
20.0000 meq | EXTENDED_RELEASE_TABLET | Freq: Two times a day (BID) | ORAL | Status: AC
  Administered 2023-09-06 – 2023-09-07 (×4): 20 meq via ORAL
  Filled 2023-09-06 (×4): qty 1

## 2023-09-06 MED ORDER — FLUTICASONE FUROATE-VILANTEROL 200-25 MCG/ACT IN AEPB
1.0000 | INHALATION_SPRAY | Freq: Every day | RESPIRATORY_TRACT | Status: DC
  Administered 2023-09-06 – 2023-09-07 (×2): 1 via RESPIRATORY_TRACT
  Filled 2023-09-06: qty 28

## 2023-09-06 MED ORDER — ENOXAPARIN SODIUM 40 MG/0.4ML IJ SOSY
40.0000 mg | PREFILLED_SYRINGE | INTRAMUSCULAR | Status: DC
  Administered 2023-09-06: 40 mg via SUBCUTANEOUS
  Filled 2023-09-06: qty 0.4

## 2023-09-06 MED ORDER — FERROUS SULFATE 325 (65 FE) MG PO TABS
325.0000 mg | ORAL_TABLET | Freq: Every day | ORAL | Status: DC
  Administered 2023-09-07: 325 mg via ORAL
  Filled 2023-09-06: qty 1

## 2023-09-06 MED ORDER — VITAMIN B-12 1000 MCG PO TABS
1000.0000 ug | ORAL_TABLET | Freq: Every day | ORAL | Status: DC
  Administered 2023-09-06 – 2023-09-07 (×2): 1000 ug via ORAL
  Filled 2023-09-06 (×2): qty 1

## 2023-09-06 MED ORDER — ACETAMINOPHEN 650 MG RE SUPP: 650.0000 mg | Freq: Four times a day (QID) | RECTAL | Status: DC | PRN

## 2023-09-06 MED ORDER — FOLIC ACID 1 MG PO TABS
1.0000 mg | ORAL_TABLET | Freq: Every day | ORAL | Status: DC
  Administered 2023-09-06 – 2023-09-07 (×2): 1 mg via ORAL
  Filled 2023-09-06 (×2): qty 1

## 2023-09-06 MED ORDER — MAGNESIUM OXIDE -MG SUPPLEMENT 400 (240 MG) MG PO TABS
400.0000 mg | ORAL_TABLET | Freq: Two times a day (BID) | ORAL | Status: DC
  Administered 2023-09-06 – 2023-09-07 (×4): 400 mg via ORAL
  Filled 2023-09-06 (×4): qty 1

## 2023-09-06 MED ORDER — HYDROCODONE-ACETAMINOPHEN 10-325 MG PO TABS
1.0000 | ORAL_TABLET | Freq: Three times a day (TID) | ORAL | Status: DC | PRN
  Administered 2023-09-07: 1 via ORAL
  Filled 2023-09-06: qty 1

## 2023-09-06 MED ORDER — ARIPIPRAZOLE 5 MG PO TABS
15.0000 mg | ORAL_TABLET | Freq: Every day | ORAL | Status: DC
  Administered 2023-09-06 – 2023-09-07 (×2): 15 mg via ORAL
  Filled 2023-09-06: qty 3
  Filled 2023-09-06: qty 2

## 2023-09-06 MED ORDER — ALBUTEROL SULFATE (2.5 MG/3ML) 0.083% IN NEBU: 3.0000 mL | INHALATION_SOLUTION | Freq: Four times a day (QID) | RESPIRATORY_TRACT | Status: DC | PRN

## 2023-09-06 MED ORDER — SODIUM CHLORIDE 0.9% FLUSH
3.0000 mL | Freq: Two times a day (BID) | INTRAVENOUS | Status: DC
  Administered 2023-09-06 – 2023-09-07 (×4): 3 mL via INTRAVENOUS

## 2023-09-06 MED ORDER — BUPROPION HCL ER (XL) 150 MG PO TB24
300.0000 mg | ORAL_TABLET | Freq: Every day | ORAL | Status: DC
  Administered 2023-09-06 – 2023-09-07 (×2): 300 mg via ORAL
  Filled 2023-09-06 (×2): qty 2

## 2023-09-06 MED ORDER — ENOXAPARIN SODIUM 40 MG/0.4ML IJ SOSY
40.0000 mg | PREFILLED_SYRINGE | Freq: Two times a day (BID) | INTRAMUSCULAR | Status: DC
  Administered 2023-09-06 – 2023-09-07 (×3): 40 mg via SUBCUTANEOUS
  Filled 2023-09-06 (×3): qty 0.4

## 2023-09-06 MED ORDER — NITROFURANTOIN MONOHYD MACRO 100 MG PO CAPS
100.0000 mg | ORAL_CAPSULE | Freq: Two times a day (BID) | ORAL | Status: DC
  Administered 2023-09-06 – 2023-09-07 (×4): 100 mg via ORAL
  Filled 2023-09-06 (×7): qty 1

## 2023-09-06 MED ORDER — ACETAMINOPHEN 325 MG PO TABS: 650.0000 mg | ORAL_TABLET | Freq: Four times a day (QID) | ORAL | Status: DC | PRN

## 2023-09-06 MED ORDER — POLYETHYLENE GLYCOL 3350 17 G PO PACK: 17.0000 g | PACK | Freq: Every day | ORAL | Status: DC | PRN

## 2023-09-06 MED ORDER — IPRATROPIUM-ALBUTEROL 0.5-2.5 (3) MG/3ML IN SOLN
3.0000 mL | Freq: Four times a day (QID) | RESPIRATORY_TRACT | Status: DC | PRN
  Administered 2023-09-07: 3 mL via RESPIRATORY_TRACT
  Filled 2023-09-06: qty 3

## 2023-09-06 MED ORDER — DULOXETINE HCL 60 MG PO CPEP
60.0000 mg | ORAL_CAPSULE | Freq: Two times a day (BID) | ORAL | Status: DC
  Administered 2023-09-06 – 2023-09-07 (×4): 60 mg via ORAL
  Filled 2023-09-06 (×3): qty 1
  Filled 2023-09-06: qty 2

## 2023-09-06 MED ORDER — NEBIVOLOL HCL 10 MG PO TABS
10.0000 mg | ORAL_TABLET | Freq: Every day | ORAL | Status: DC
  Administered 2023-09-06: 10 mg via ORAL
  Filled 2023-09-06 (×3): qty 1

## 2023-09-06 MED ORDER — PHENTERMINE HCL 37.5 MG PO CAPS
37.5000 mg | ORAL_CAPSULE | ORAL | Status: DC
Start: 2023-09-07 — End: 2023-09-06

## 2023-09-06 MED ORDER — GABAPENTIN 300 MG PO CAPS: 600.0000 mg | ORAL_CAPSULE | Freq: Two times a day (BID) | ORAL | Status: DC | PRN

## 2023-09-06 MED ORDER — ALPRAZOLAM 0.25 MG PO TABS
1.0000 mg | ORAL_TABLET | Freq: Two times a day (BID) | ORAL | Status: DC
  Administered 2023-09-06 (×2): 1 mg via ORAL
  Filled 2023-09-06 (×2): qty 4

## 2023-09-06 MED ORDER — ESTROGENS CONJUGATED 0.625 MG/GM VA CREA
1.0000 | TOPICAL_CREAM | Freq: Every day | VAGINAL | Status: DC
  Administered 2023-09-06: 1 via VAGINAL
  Filled 2023-09-06: qty 30

## 2023-09-06 MED ORDER — ONDANSETRON HCL 4 MG PO TABS: 4.0000 mg | ORAL_TABLET | Freq: Four times a day (QID) | ORAL | Status: DC | PRN

## 2023-09-06 MED ORDER — ARMODAFINIL 250 MG PO TABS: 250.0000 mg | ORAL_TABLET | Freq: Every day | ORAL | Status: DC

## 2023-09-06 MED ORDER — FUROSEMIDE 10 MG/ML IJ SOLN
40.0000 mg | Freq: Once | INTRAMUSCULAR | Status: AC
  Administered 2023-09-06: 40 mg via INTRAVENOUS
  Filled 2023-09-06: qty 4

## 2023-09-06 MED ORDER — OXCARBAZEPINE 300 MG PO TABS
300.0000 mg | ORAL_TABLET | Freq: Two times a day (BID) | ORAL | Status: DC
Start: 2023-09-06 — End: 2023-09-08
  Administered 2023-09-06 – 2023-09-07 (×4): 300 mg via ORAL
  Filled 2023-09-06 (×5): qty 1

## 2023-09-06 NOTE — Progress Notes (Signed)
   09/06/23 0414  BiPAP/CPAP/SIPAP  BiPAP/CPAP/SIPAP Pt Type Adult  BiPAP/CPAP/SIPAP Resmed  Mask Type Full face mask  Mask Size Medium  Respiratory Rate 24 breaths/min  EPAP 10 cmH2O  FiO2 (%) 28 %  Flow Rate 2 lpm  Patient Home Mask No  Patient Home Tubing No  Auto Titrate No  Device Plugged into RED Power Outlet Yes  BiPAP/CPAP /SiPAP Vitals  Pulse Rate 84  Resp (!) 24  SpO2 99 %  Bilateral Breath Sounds Diminished  MEWS Score/Color  MEWS Score 1  MEWS Score Color Green

## 2023-09-06 NOTE — Progress Notes (Signed)
 PROGRESS NOTE    [name]  EXB:[id number] DOB: January 03, 1971 DOA: 09/05/2023 PCP: Jess Morita, MD    Brief Narrative:  53 year old with history of morbid obesity with a BMI of 71, sleep apnea, chronic hypoxic respite failure on home oxygen , anxiety depression and chronic pain, bedbound status, chronic heart failure with preserved ejection fraction presented from home with altered mental status.  She does have frequent episodes of disorientation when not using supplemental oxygen  as per family.  Patient herself denies any complaints.  In the emergency room afebrile, saturation more than 90% on 2 L oxygen .  Blood pressure stable.  Chest x-ray with cardiomegaly and central vascular congestion.  She was given IV Lasix  and IV Zosyn  in the ER.  Urinalysis was abnormal.  Admitted with altered mentation.  Subjective: Patient seen and examined in the emergency room.  Denies any complaints.  She was found again with depressed mentation earlier today with her oxygen  cannula on the floor.  Blood gas without significant CO2 retention. Patient tells me that her CPAP machine is not working at home and she is getting a new sleep study.  No family at the bedside.   Assessment & Plan:   Acute metabolic encephalopathy: Multifactorial , sleep apnea , polypharmacy, benzodiazepines  Mental status is currently better . No focal neurological deficits.  [name], not treated , waiting for results from home sleep study.  Use CPAP in the hospital, will follow up on her studies Keep on oxygen  to keep sats >90% Decrease dose of ativan , continue gabapentin , cymbalta , abilify , wellbutrin   PT/OT- refer to SNF  Acute UTI , present on admission: h/o ESBL . Confused . Not sure urinary symptoms . Started on nitrofurantoin  . Wait for urine cultures.   Acute on chronic HFpEF: Previously known normal ejection fraction.  BNP elevated.  Chest x-ray with cardiomegaly.  Will repeat dose of Lasix  today.  Going to repeat  echocardiogram today.  Debility: Profound physical debility.  Needs PT OT and rehab.  Depression anxiety: Continue Xanax  on reduced dose, Abilify , Wellbutrin , Cymbalta  and Trileptal .  Chronic pain syndrome: Avoid excess dose of medications.  Continue gabapentin .  Reduced dose of Norco.  Low-grade fever: Likely secondary to UTI.  Check respiratory virus panel.   DVT prophylaxis: enoxaparin  (LOVENOX ) injection 40 mg Start: 09/06/23 1000   Code Status: DNR with limited intervention Family Communication: Son called and updated on phone Disposition Plan: Status is: Inpatient Remains inpatient appropriate because: Altered mental status, hypoxemia     Consultants:  None  Procedures:  None  Antimicrobials:  Nitrofurantoin  4/22---     Objective: Vitals:   09/06/23 1056 09/06/23 1130 09/06/23 1321 09/06/23 1330  BP:  104/65  (!) 90/48  Pulse:   (!) 113 96  Resp:  (!) 28 (!) 27 (!) 30  Temp: (!) 100.8 F (38.2 C)     TempSrc: Axillary     SpO2:   100% 100%  Weight:      Height:        Intake/Output Summary (Last 24 hours) at 09/06/2023 1416 Last data filed at 09/06/2023 1110 Gross per 24 hour  Intake --  Output 1450 ml  Net -1450 ml   Filed Weights   09/05/23 1919  Weight: (!) 170 kg    Examination:  General exam: Appears calm and comfortable  Looks older than his stated age.  Frail and debilitated.  Morbidly obese.  Not in any distress.  She was on 2 L oxygen  on my exam.  Respiratory system: Clear to auscultation. Respiratory effort normal.  No added sounds. Cardiovascular system: S1 & S2 heard, RRR.  Gastrointestinal system: Soft.  Nontender.  Bowel sound present. Central nervous system: Alert and awake.  Mostly oriented.  Moves all extremities. Gross generalized weakness.    Data Reviewed: I have personally reviewed following labs and imaging studies  CBC: Recent Labs  Lab 09/05/23 1940 09/05/23 1947 09/06/23 0627  WBC 10.0  --  8.0  NEUTROABS 7.6   --   --   HGB 11.7* 12.6 11.8*  HCT 36.7 37.0 38.1  MCV 96.8  --  95.3  PLT 232  --  257   Basic Metabolic Panel: Recent Labs  Lab 09/05/23 1940 09/05/23 1947 09/06/23 0627  NA 134* 133* 133*  K 4.2 4.1 3.4*  CL 90*  --  86*  CO2 36*  --  39*  GLUCOSE 102*  --  87  BUN 7  --  5*  CREATININE 0.55  --  0.62  CALCIUM  8.5*  --  8.3*  MG  --   --  1.6*   GFR: Estimated Creatinine Clearance: 124.1 mL/min (by C-G formula based on SCr of 0.62 mg/dL). Liver Function Tests: No results for input(s): "AST", "ALT", "ALKPHOS", "BILITOT", "PROT", "ALBUMIN" in the last 168 hours. No results for input(s): "LIPASE", "AMYLASE" in the last 168 hours. No results for input(s): "AMMONIA" in the last 168 hours. Coagulation Profile: No results for input(s): "INR", "PROTIME" in the last 168 hours. Cardiac Enzymes: No results for input(s): "CKTOTAL", "CKMB", "CKMBINDEX", "TROPONINI" in the last 168 hours. BNP (last 3 results) No results for input(s): "PROBNP" in the last 8760 hours. HbA1C: No results for input(s): "HGBA1C" in the last 72 hours. CBG: No results for input(s): "GLUCAP" in the last 168 hours. Lipid Profile: No results for input(s): "CHOL", "HDL", "LDLCALC", "TRIG", "CHOLHDL", "LDLDIRECT" in the last 72 hours. Thyroid  Function Tests: No results for input(s): "TSH", "T4TOTAL", "FREET4", "T3FREE", "THYROIDAB" in the last 72 hours. Anemia Panel: No results for input(s): "VITAMINB12", "FOLATE", "FERRITIN", "TIBC", "IRON", "RETICCTPCT" in the last 72 hours. Sepsis Labs: No results for input(s): "PROCALCITON", "LATICACIDVEN" in the last 168 hours.  No results found for this or any previous visit (from the past 240 hours).       Radiology Studies: DG Chest Port 1 View Result Date: 09/05/2023 CLINICAL DATA:  Hypoxia EXAM: PORTABLE CHEST 1 VIEW COMPARISON:  Chest x-ray 07/14/2023 FINDINGS: There is stable moderate elevation of the right hemidiaphragm with atelectasis in the right lung  base. There is also a band of atelectasis in the retrocardiac region. The heart is enlarged there is central pulmonary vascular congestion. There is no pneumothorax or pleural effusion identified. No acute fractures are seen. IMPRESSION: 1. Cardiomegaly with central pulmonary vascular congestion. 2. Stable moderate elevation of the right hemidiaphragm with atelectasis in the right lung base. 3. Band of atelectasis in the retrocardiac region. Electronically Signed   By: Tyron Gallon M.D.   On: 09/05/2023 20:03        Scheduled Meds:  ARIPiprazole   15 mg Oral Daily   Armodafinil   250 mg Oral Q0600   buPROPion   300 mg Oral Daily   calcium  citrate  1 tablet Oral Daily   conjugated estrogens   1 Applicatorful Vaginal QHS   cyanocobalamin   1,000 mcg Oral Daily   DULoxetine   60 mg Oral BID   enoxaparin  (LOVENOX ) injection  40 mg Subcutaneous Q24H   [START ON 09/07/2023] ferrous sulfate   325 mg  Oral Q breakfast   fluticasone  furoate-vilanterol  1 puff Inhalation Daily   folic acid   1 mg Oral Daily   furosemide   40 mg Intravenous Once   magnesium  oxide  400 mg Oral BID   nebivolol   10 mg Oral Daily   nitrofurantoin  (macrocrystal-monohydrate)  100 mg Oral Q12H   Oxcarbazepine   300 mg Oral BID   pantoprazole   40 mg Oral BID   [START ON 09/07/2023] phentermine   37.5 mg Oral BH-q7a   potassium chloride   20 mEq Oral BID   sodium chloride  flush  3 mL Intravenous Q12H   Continuous Infusions:   LOS: 0 days       Vada Garibaldi, MD [facility]

## 2023-09-06 NOTE — ED Provider Notes (Signed)
 Called to see patient her oxygen  was on the floor normally she wears CPAP.  She has been admitted for the past 10 hours.  They are contacting her admitting team.  Patient placed on 100% nonrebreather.  Now becoming a little more responsive.  Respiratory therapy is here is going to put her back on her CPAP.  Patient may need a venous blood gas.  But she is following commands currently.  But is tachycardic and still working hard to breathe.  At this point in time does not require immediate intubation.   Selah Zelman, MD 09/06/23 [phone]

## 2023-09-06 NOTE — Evaluation (Signed)
 Physical Therapy Evaluation Patient Details Name: [name] MRN: [id number] DOB: May 15, 1971 Today's Date: 09/06/2023  History of Present Illness  Patient is a 52 year old female from home with AMS when not wearing oxygen . Family concerned they are unable to care for her at home. History of [name]/OHS, chronic hypoxic respiratory failure, depression, anxiety, chronic pain, myelomalacia of cervical cord, bedbound status, HFpEF, obesity, on 2 L02.  Clinical Impression  PT evaluation completed. Patient reports she lives at home with her son. She uses a hoyer lift for routine out of bed mobility with DME set-up at home. She reports she is alone during the day at home but her son assists her with mobility and ADLs at baseline. She uses a scooter intermittently in the home.  Today the patient required +2 person assistance just for rolling in the bed. +3 person assistance required for boosting up in the bed for comfort. She was incontinent of bowel and bladder with total assistance needed for peri care and linen change. Noted open wound on right side of lower buttocks/ posterior upper thigh area. She is fatigued with minimal activity. PT will continue to follow in attempts to decrease caregiver burden. Anticipate patient will need rehabilitation < 3 hours/day after this hospital stay and/or long term care if patient does not have adequate caregiver support in the home.       If plan is discharge home, recommend the following: Two people to help with walking and/or transfers;Two people to help with bathing/dressing/bathroom;Assist for transportation;Help with stairs or ramp for entrance;Assistance with cooking/housework;Direct supervision/assist for medications management   Can travel by private vehicle   No    Equipment Recommendations None recommended by PT  Recommendations for Other Services       Functional Status Assessment Patient has had a recent decline in their functional status and  demonstrates the ability to make significant improvements in function in a reasonable and predictable amount of time.     Precautions / Restrictions Precautions Precautions: Fall Recall of Precautions/Restrictions: Intact Restrictions Weight Bearing Restrictions Per Provider Order: No      Mobility  Bed Mobility Overal bed mobility: Needs Assistance Bed Mobility: Rolling Rolling: +2 for physical assistance, Total assist         General bed mobility comments: patient does initiate reaching across midline for rolling to left and right. however she requires +2 person assistance due to large body habitus and generalized weakness. +3 person assistance required for boosting up in the bed for comfort    Transfers                        Ambulation/Gait                  Stairs            Wheelchair Mobility     Tilt Bed    Modified Rankin (Stroke Patients Only)       Balance                                             Pertinent Vitals/Pain Pain Assessment Pain Assessment: No/denies pain    Home Living Family/patient expects to be discharged to:: Private residence Living Arrangements:  (son) Available Help at Discharge: Family;Available PRN/intermittently Type of Home: House Home Access: Level entry       Home  Layout: Able to live on main level with bedroom/bathroom Home Equipment: Rollator (4 wheels);Electric scooter;Wheelchair - manual;Hospital bed;Shower seat (oxygen )      Prior Function Prior Level of Function : Needs assist;Patient poor historian/Family not available             Mobility Comments: she reports getting out of bed daily, mostly with hoyer lift. son assist with sitting up on edge of bed twice per day. she was standing with assistance 1 month ago. Mod I with scooter around the home ADLs Comments: assistance required from home health (?nursing) and son     Extremity/Trunk Assessment   Upper  Extremity Assessment Upper Extremity Assessment: Generalized weakness (active movement in BUE with fair grip strength.)    Lower Extremity Assessment Lower Extremity Assessment: Generalized weakness (difficult to formally assess due to large body habitus. active ankle and knee movement)       Communication   Communication Communication: Impaired Factors Affecting Communication: Difficulty expressing self;Reduced clarity of speech    Cognition Arousal: Lethargic Behavior During Therapy: WFL for tasks assessed/performed   PT - Cognitive impairments: No family/caregiver present to determine baseline                       PT - Cognition Comments: patient is able to follow single step commands with increased time. she has periods of confusion but is grossly oriented. poor historian Following commands: Impaired Following commands impaired: Follows one step commands with increased time     Cueing Cueing Techniques: Verbal cues, Tactile cues     General Comments General comments (skin integrity, edema, etc.): the patient was incontinent of bowel and bladder, requiring total assistance for pericare, linen change. Sp02 in the 90's on 5 L02 bed mobility. noted large area of skin breakdown on the right lower buttock/upper thigh area    Exercises     Assessment/Plan    PT Assessment Patient needs continued PT services  PT Problem List Decreased strength;Decreased range of motion;Decreased activity tolerance;Decreased mobility;Decreased balance;Decreased safety awareness;Obesity;Decreased skin integrity       PT Treatment Interventions Patient/family education;Wheelchair mobility training;Functional mobility training;Therapeutic activities;Therapeutic exercise;Balance training;Cognitive remediation;DME instruction    PT Goals (Current goals can be found in the Care Plan section)  Acute Rehab PT Goals Patient Stated Goal: to feel better PT Goal Formulation: With patient Time  For Goal Achievement: 09/20/23 Potential to Achieve Goals: Poor    Frequency Min 1X/week     Co-evaluation               AM-PAC PT "6 Clicks" Mobility  Outcome Measure Help needed turning from your back to your side while in a flat bed without using bedrails?: Total Help needed moving from lying on your back to sitting on the side of a flat bed without using bedrails?: Total Help needed moving to and from a bed to a chair (including a wheelchair)?: Total Help needed standing up from a chair using your arms (e.g., wheelchair or bedside chair)?: Total Help needed to walk in hospital room?: Total Help needed climbing 3-5 steps with a railing? : Total 6 Click Score: 6    End of Session Equipment Utilized During Treatment: Oxygen  Activity Tolerance: Patient limited by fatigue;Patient limited by lethargy Patient left: in bed;with call bell/phone within reach;with nursing/sitter in room Nurse Communication: Mobility status PT Visit Diagnosis: Muscle weakness (generalized) (M62.81);Unsteadiness on feet (R26.81)    Time: 1050-1120 PT Time Calculation (min) (ACUTE ONLY): 30 min  Charges:   PT Evaluation $PT Eval Moderate Complexity: 1 Mod PT Treatments $Therapeutic Activity: 8-22 mins PT General Charges $$ ACUTE PT VISIT: 1 Visit         Ozie Bo, PT, MPT   Erlene Hawks 09/06/2023, 12:58 PM

## 2023-09-06 NOTE — H&P (Signed)
 History and Physical    [name] GNF:[id number] DOB: November 02, 1970 DOA: 09/05/2023  PCP: Jess Morita, MD   Patient coming from: Home   Chief Complaint: AMS   HPI: [name] is a 53 y.o. female with medical history significant for BMI 71, [name]/OHS, chronic hypoxic respiratory failure, depression, anxiety, chronic pain, myelomalacia of cervical cord, bedbound status, and chronic HFpEF who presents after an episode of altered mental status.  Patient has frequent episodes of disorientation when not using her supplemental oxygen .  These resolve with resumption of supplemental oxygen  administration.  She had 2 of these episodes today and family called EMS.  On EMS arrival, nasal cannula was not in her nose.  The nasal cannula was repositioned and she returned to her baseline by time of arrival in the ED.  Patient does not have any acute complaints, denies abdominal pain or chest pain, does not feel more short of breath than usual, and denies fever, chills, or flank pain.  The patient has been bedbound at her baseline and family reports difficulty caring for her adequately at home.  ED Course: Upon arrival to the ED, patient is found to be afebrile and saturating well on 2 L/min of supplemental oxygen  with normal heart rate and stable blood pressure.  Labs are most notable for serum bicarbonate 36, normal WBC, normal creatinine, BNP 150, and urine nitrites.  Chest x-ray reveals cardiomegaly and central pulmonary vascular congestion.  Patient was treated with Zosyn  and IV Lasix  in the ED.  Review of Systems:  All other systems reviewed and apart from HPI, are negative.  Past Medical History:  Diagnosis Date   Allergic rhinitis    Ankle fracture, right    Anxiety    ASCUS of cervix with negative high risk HPV 05/2018   Carpal tunnel syndrome    Cervical cancer (HCC) 1998   Fibromyalgia    Hyperlipidemia    Hypertension    Migraine headache    Obesity    Sleep apnea     Past  Surgical History:  Procedure Laterality Date   ABDOMINAL HYSTERECTOMY  10/1996   ABDOMINAL SURGERY  jan/11/2012   gastric bypass surgery   CARPAL TUNNEL RELEASE     bilateral    CESAREAN SECTION     CHOLECYSTECTOMY  07/1992   ESOPHAGOGASTRODUODENOSCOPY N/A 01/14/2016   Procedure: ESOPHAGOGASTRODUODENOSCOPY (EGD);  Surgeon: Kavitha V Nandigam, MD;  Location: [facility] ENDOSCOPY;  Service: Endoscopy;  Laterality: N/A;    Social History:   reports that she has never smoked. She has never used smokeless tobacco. She reports that she does not drink alcohol and does not use drugs.  Allergies  Allergen Reactions   Erythromycin Anaphylaxis   Niacin Anaphylaxis, Shortness Of Breath and Swelling    Swelling and "problems breathing"   Sulfamethoxazole-Trimethoprim Anaphylaxis, Swelling, Rash and Other (See Comments)    Throat closed and the eyes became swollen   Aspirin Hives   Benzoin Compound Rash   Cephalexin Hives    Tolerated ceftriaxone  and cefepime  07/2019   Iohexol  Hives    Pt treated with PO benedryl   Lisinopril Hives   Naproxen Hives   Pnu-Imune [Pneumococcal Polysaccharide Vaccine] Rash   Sulfonamide Derivatives Rash    Family History  Problem Relation Age of Onset   Diabetes Mother    Emphysema Mother    Allergies Mother    Hypertension Mother    Heart disease Father    Allergies Father    Prostate cancer Father  Breast cancer Maternal Grandmother 24     Prior to Admission medications   Medication Sig Start Date End Date Taking? Authorizing Provider  albuterol  (VENTOLIN  HFA) 108 (90 Base) MCG/ACT inhaler Inhale 1 puff into the lungs every 6 (six) hours as needed for wheezing or shortness of breath.    [provider]  ALPRAZolam  (XANAX ) 1 MG tablet TAKE 1 TABLET BY MOUTH 2 TIMES DAILY 07/05/23   [name], Katherine E, MD  ARIPiprazole  (ABILIFY ) 15 MG tablet TAKE 1 TABLET BY MOUTH EVERY DAY 06/27/23   [name], Katherine E, MD  Armodafinil  250 MG tablet Take 1 tablet  (250 mg total) by mouth in the morning and at bedtime. 08/10/22   [name], Katherine E, MD  Biotin  10 MG TABS Take 1 tablet (10 mg total) by mouth in the morning. 02/10/23   Jess Morita, MD  buPROPion  (WELLBUTRIN  XL) 300 MG 24 hr tablet TAKE 1 TABLET BY MOUTH EVERY DAY 08/14/23   [name], Katherine E, MD  calcium  citrate (CALCITRATE - DOSED IN MG ELEMENTAL CALCIUM ) 950 (200 Ca) MG tablet TAKE 1 TABLET BY MOUTH ONCE DAILY 07/26/23   [name], Katherine E, MD  conjugated estrogens  (PREMARIN ) vaginal cream Apply vaginally once daily x 1 week then TWO times weekly to help with urinary symptoms 08/07/23   Ernie Heal, Jerelyn Money, MD  cyanocobalamin  (VITAMIN B12) 1000 MCG tablet TAKE 1 TABLET BY MOUTH ONCE DAILY 08/22/23   [name], Katherine E, MD  diclofenac  Sodium (VOLTAREN ) 1 % GEL Apply 4 g topically 4 (four) times daily. 08/10/22   Jess Morita, MD  DULoxetine  (CYMBALTA ) 60 MG capsule Take 2 capsules (120 mg total) by mouth daily. Patient taking differently: Take 60 mg by mouth 2 (two) times daily. 06/22/15   [name], Katherine E, MD  FEROSUL 325 (65 Fe) MG tablet TAKE 1 TABLET BY MOUTH EVERY MORNING WITH BREAKFAST 08/22/23   [name], Katherine E, MD  fluticasone -salmeterol (ADVAIR) 250-50 MCG/ACT AEPB Inhale 1 puff into the lungs every 12 (twelve) hours. 06/13/23   Quillian Brunt, MD  folic acid  (FOLVITE ) 1 MG tablet TAKE 1 TABLET BY MOUTH ONCE DAILY 08/22/23   [name], Katherine E, MD  furosemide  (LASIX ) 40 MG tablet TAKE 1 TABLET BY MOUTH ONCE DAILY MAY TAKE AN ADDITIONAL TABLET BY MOUTH IF WEIGHT GAIN OF 5LBS AND CALL MD FOR FURTHER INSTRUCTIONS 07/26/23   Jess Morita, MD  gabapentin  (NEURONTIN ) 600 MG tablet Take 1 tablet (600 mg total) by mouth 2 (two) times daily as needed. Pt takes daily - can take up to twice daily 11/24/22   Daren Eck, DO  GNP VITAMIN B-1 100 MG tablet TAKE 1 TABLET BY MOUTH EVERY DAY 08/22/23   [name], Katherine E, MD  HYDROcodone -acetaminophen  (NORCO) 10-325 MG tablet TAKE  1 TABLET BY MOUTH EVERY 8 HOURS AS NEEDED 08/03/23   [name], Katherine E, MD  ipratropium-albuterol  (DUONEB) 0.5-2.5 (3) MG/3ML SOLN Take 3 mLs by nebulization every 6 (six) hours as needed (wheezing/shortness of breath). 01/14/23   [place], Rema Care, DO  magnesium  oxide (MAG-OX) 400 (240 Mg) MG tablet Take 1 tablet by mouth daily. 06/20/23   [provider]  nebivolol  (BYSTOLIC ) 10 MG tablet TAKE 1 TABLET BY MOUTH EVERY DAY 08/14/23   [name], Katherine E, MD  nystatin  (MYCOSTATIN /NYSTOP ) powder APPLY 1 APPLICATION TOPICALLY 3 TIMES DAILY Patient taking differently: Apply 1 Application topically 3 (three) times daily as needed (yeast infection). 08/17/22   [name], Katherine E, MD  Oxcarbazepine  (TRILEPTAL ) 300 MG  tablet Take 300 mg by mouth 2 (two) times daily.    [provider]  pantoprazole  (PROTONIX ) 40 MG tablet TAKE 1 TABLET BY MOUTH 2 TIMES DAILY 08/14/23   [name], Katherine E, MD  phentermine  37.5 MG capsule Take 1 capsule (37.5 mg total) by mouth every morning. 06/28/23   [name], Katherine E, MD  potassium chloride  (KLOR-CON ) 20 MEQ packet Take 1 packet by mouth daily. Take with lasix . 11/24/22 07/12/24  Daren Eck, DO  promethazine  (PHENERGAN ) 25 MG tablet TAKE 1 TABLET BY MOUTH EVERY 6 HOURS AS NEEDED FOR NAUSEA AND VOMITING 04/27/23   [name], Katherine E, MD  promethazine -dextromethorphan (PROMETHAZINE -DM) 6.25-15 MG/5ML syrup Take 5 mLs by mouth 4 (four) times daily as needed. Patient taking differently: Take 5 mLs by mouth 4 (four) times daily as needed for cough. 06/26/23   Jess Morita, MD  pyridOXINE  (VITAMIN B6) 100 MG tablet TAKE 1 TABLET BY MOUTH ONCE DAILY 08/22/23   Jess Morita, MD    Physical Exam: Vitals:   09/05/23 2230 09/05/23 2232 09/05/23 2300 09/06/23 0015  BP: 106/69  114/70 120/67  Pulse: 87 85 85 82  Resp:  19  16  Temp:   98.5 F (36.9 C) 98.4 F (36.9 C)  TempSrc:    Oral  SpO2: 98% 98% 97% 97%  Weight:      Height:         Constitutional: NAD, calm  Eyes: PERTLA, lids and conjunctivae normal ENMT: Mucous membranes are moist. Posterior pharynx clear of any exudate or lesions.   Neck: supple, no masses  Respiratory: No wheezing, no crackles. No accessory muscle use.  Cardiovascular: S1 & S2 heard, regular rate and rhythm. No JVD. Abdomen: No tenderness, soft. Bowel sounds active.  Musculoskeletal: no clubbing / cyanosis. No joint deformity upper and lower extremities.   Skin: no significant rashes, lesions, ulcers. Warm, dry, well-perfused. Neurologic: CN 2-12 grossly intact. Moving all extremities. Alert and oriented to person, place, and situation.  Psychiatric: Pleasant. Cooperative.    Labs and Imaging on Admission: I have personally reviewed following labs and imaging studies  CBC: Recent Labs  Lab 09/05/23 1940 09/05/23 1947  WBC 10.0  --   NEUTROABS 7.6  --   HGB 11.7* 12.6  HCT 36.7 37.0  MCV 96.8  --   PLT 232  --    Basic Metabolic Panel: Recent Labs  Lab 09/05/23 1940 09/05/23 1947  NA 134* 133*  K 4.2 4.1  CL 90*  --   CO2 36*  --   GLUCOSE 102*  --   BUN 7  --   CREATININE 0.55  --   CALCIUM  8.5*  --    GFR: Estimated Creatinine Clearance: 124.1 mL/min (by C-G formula based on SCr of 0.55 mg/dL). Liver Function Tests: No results for input(s): "AST", "ALT", "ALKPHOS", "BILITOT", "PROT", "ALBUMIN" in the last 168 hours. No results for input(s): "LIPASE", "AMYLASE" in the last 168 hours. No results for input(s): "AMMONIA" in the last 168 hours. Coagulation Profile: No results for input(s): "INR", "PROTIME" in the last 168 hours. Cardiac Enzymes: No results for input(s): "CKTOTAL", "CKMB", "CKMBINDEX", "TROPONINI" in the last 168 hours. BNP (last 3 results) No results for input(s): "PROBNP" in the last 8760 hours. HbA1C: No results for input(s): "HGBA1C" in the last 72 hours. CBG: No results for input(s): "GLUCAP" in the last 168 hours. Lipid Profile: No results  for input(s): "CHOL", "HDL", "LDLCALC", "TRIG", "CHOLHDL", "LDLDIRECT" in the last 72 hours. Thyroid   Function Tests: No results for input(s): "TSH", "T4TOTAL", "FREET4", "T3FREE", "THYROIDAB" in the last 72 hours. Anemia Panel: No results for input(s): "VITAMINB12", "FOLATE", "FERRITIN", "TIBC", "IRON", "RETICCTPCT" in the last 72 hours. Urine analysis:    Component Value Date/Time   COLORURINE YELLOW 09/05/2023 2220   APPEARANCEUR CLEAR 09/05/2023 2220   LABSPEC 1.009 09/05/2023 2220   PHURINE 7.0 09/05/2023 2220   GLUCOSEU NEGATIVE 09/05/2023 2220   HGBUR NEGATIVE 09/05/2023 2220   HGBUR negative 01/05/2009 1414   BILIRUBINUR NEGATIVE 09/05/2023 2220   BILIRUBINUR negative 10/05/2020 1139   KETONESUR NEGATIVE 09/05/2023 2220   PROTEINUR NEGATIVE 09/05/2023 2220   UROBILINOGEN 0.2 10/05/2020 1139   UROBILINOGEN 0.2 01/05/2009 1414   NITRITE POSITIVE (A) 09/05/2023 2220   LEUKOCYTESUR NEGATIVE 09/05/2023 2220   Sepsis Labs: @LABRCNTIP (procalcitonin:4,lacticidven:4) )No results found for this or any previous visit (from the past 240 hours).   Radiological Exams on Admission: DG Chest Port 1 View Result Date: 09/05/2023 CLINICAL DATA:  Hypoxia EXAM: PORTABLE CHEST 1 VIEW COMPARISON:  Chest x-ray 07/14/2023 FINDINGS: There is stable moderate elevation of the right hemidiaphragm with atelectasis in the right lung base. There is also a band of atelectasis in the retrocardiac region. The heart is enlarged there is central pulmonary vascular congestion. There is no pneumothorax or pleural effusion identified. No acute fractures are seen. IMPRESSION: 1. Cardiomegaly with central pulmonary vascular congestion. 2. Stable moderate elevation of the right hemidiaphragm with atelectasis in the right lung base. 3. Band of atelectasis in the retrocardiac region. Electronically Signed   By: Tyron Gallon M.D.   On: 09/05/2023 20:03    EKG: Independently reviewed. Sinus rhythm.   Assessment/Plan    1. Acute on chronic HFpEF  - EF was 60-65% on echo from July 2024  - BNP is up and CXR findings suggest CHF  - Continue diuresis with IV Lasix , monitor weight and I/Os, can likely resume oral Lasix  tomorrow    2. Cystitis  - No systemic signs/symptoms  - Hx of ESBL in urine  - Treat with nitrofurantoin    3. Debility  - Bed-bound at baseline, family wants her discharged to SNF  - PT and TOC consulted    4. [name]/OHS; chronic hypoxic respiratory failure  - Continue CPAP while sleeping, armodafinil , supplemental O2    5. Depression, anxiety  - Continue Xanax , Abilify , Wellbutrin , Cymbalta , Trileptal     6. Chronic pain  - Prescription database reviewed, plan to continue Norco, gabapentin      DVT prophylaxis: Lovenox   Code Status: DNR  Level of Care: Level of care: Telemetry Cardiac Family Communication: Family updated from ED  Disposition Plan:  Patient is from: home  Anticipated d/c is to: TBD Anticipated d/c date is: 09/06/23  Patient currently: Pending PT evaluation, disposition planning  Consults called: None  Admission status: Observation     Walton Guppy, MD [facility]  09/06/2023, 1:03 AM

## 2023-09-06 NOTE — ED Notes (Signed)
Full linen change 

## 2023-09-06 NOTE — Progress Notes (Signed)
 RT called to bedside to assess pt due to confusion and desatting episode. RT attempted to place pt on CPAP but pt started to gag and verbalize need to vomit. RT placed pt back on Zemple and gave pt emesis bag. RN notified ED and admitting MD.

## 2023-09-06 NOTE — ED Notes (Addendum)
 Pt's O2 drops when she is sleeping as she pulls her O2 off.  Pt states she uses a cpap at night.  Admitting provider informed and he ordered cpap.  Respiratory called and will bring cpap for pt.

## 2023-09-06 NOTE — ED Notes (Addendum)
 This RN went into pt's room to ask her pain level since she had called asking for pain meds, she was seen laying in bed not wearing her O2 at approx 0930. She was very pale, not verbally responding or following commands. NRB was applied, EDP Dr. Zackowski & RT was called & brought into room. Pt's spO2 monitor was reapplied to her Rt hand & she was 100% (with NRB), BP 102/64 (76), tachy at 135 bpm. Admitting provider Vada Garibaldi, MD made aware of this happening & responded for her to be kept on her CPAP, RT made aware. Pt is now alert & following commands. She is now wearing 5L n/c & sating at 96%, PRN zofran  given for nausea. Will apply CPAP once sure of no emesis.

## 2023-09-07 ENCOUNTER — Inpatient Hospital Stay (HOSPITAL_COMMUNITY)

## 2023-09-07 DIAGNOSIS — R0609 Other forms of dyspnea: Secondary | ICD-10-CM

## 2023-09-07 DIAGNOSIS — I5033 Acute on chronic diastolic (congestive) heart failure: Secondary | ICD-10-CM | POA: Diagnosis not present

## 2023-09-07 LAB — CBC
HCT: 37 % (ref 36.0–46.0)
Hemoglobin: 11.2 g/dL — ABNORMAL LOW (ref 12.0–15.0)
MCH: 29.1 pg (ref 26.0–34.0)
MCHC: 30.3 g/dL (ref 30.0–36.0)
MCV: 96.1 fL (ref 80.0–100.0)
Platelets: 218 10*3/uL (ref 150–400)
RBC: 3.85 MIL/uL — ABNORMAL LOW (ref 3.87–5.11)
RDW: 14.9 % (ref 11.5–15.5)
WBC: 15.5 10*3/uL — ABNORMAL HIGH (ref 4.0–10.5)
nRBC: 0 % (ref 0.0–0.2)

## 2023-09-07 LAB — BASIC METABOLIC PANEL WITH GFR
Anion gap: 12 (ref 5–15)
BUN: 17 mg/dL (ref 6–20)
CO2: 33 mmol/L — ABNORMAL HIGH (ref 22–32)
Calcium: 8.1 mg/dL — ABNORMAL LOW (ref 8.9–10.3)
Chloride: 85 mmol/L — ABNORMAL LOW (ref 98–111)
Creatinine, Ser: 1.39 mg/dL — ABNORMAL HIGH (ref 0.44–1.00)
GFR, Estimated: 45 mL/min — ABNORMAL LOW (ref >60)
Glucose, Bld: 101 mg/dL — ABNORMAL HIGH (ref 70–99)
Potassium: 4.6 mmol/L (ref 3.5–5.1)
Sodium: 130 mmol/L — ABNORMAL LOW (ref 135–145)

## 2023-09-07 LAB — ECHOCARDIOGRAM COMPLETE
Area-P 1/2: 5.27 cm2
Height: 61 in
S' Lateral: 3.6 cm
Weight: 5823.67 [oz_av]

## 2023-09-07 MED ORDER — GERHARDT'S BUTT CREAM
TOPICAL_CREAM | Freq: Two times a day (BID) | CUTANEOUS | Status: DC
  Filled 2023-09-07: qty 60

## 2023-09-07 MED ORDER — MEDIHONEY WOUND/BURN DRESSING EX PSTE
1.0000 | PASTE | Freq: Every day | CUTANEOUS | Status: DC
  Administered 2023-09-07: 1 via TOPICAL
  Filled 2023-09-07: qty 44

## 2023-09-07 NOTE — Telephone Encounter (Signed)
Form signed and returned to [place]

## 2023-09-07 NOTE — Consult Note (Signed)
 WOC Nurse Consult Note: Reason for Consult: R buttock/thigh wound  Wound type: full thickness likely r/t moisture and friction  Pressure Injury POA: NA not pressure  Measurement: see nursing flowsheet  Wound bed: scattered areas of full thickness skin loss separated by islands of intact skin, appear 50% yellow 50% red  Drainage (amount, consistency, odor) see nursing flowsheet  Periwound: erythema with peeling epithelium  Dressing procedure/placement/frequency:  Cleanse R buttock wound with Vashe wound cleanser Timm Foot [phone]), do not rinse and allow to air dry.  Apply Medihoney to open wound beds daily, cover with dry gauze.  Apply Gerhardt's butt cream to surrounding INTACT SKIN 2 times daily and prn soiling.  Cover with silicone foam or ABD pad and tape whichever is preferred.   Patient is bedbound and would benefit from a low air loss mattress for pressure redistribution and moisture management.   POC discussed with bedside nurse. WOC team will not follow. Re-consult if further needs arise.   Thank you,    Ronni Colace MSN, RN-BC, [employer] [phone]

## 2023-09-07 NOTE — TOC Initial Note (Signed)
 Transition of Care [facility]) - Initial/Assessment Note    Patient Details  Name: [name] MRN: [id number] Date of Birth: 06/24/1970  Transition of Care [facility]) CM/SW Contact:    Jeffory Mings, [place] Phone Number: 09/07/2023, 1:52 PM  Clinical Narrative:  Spoke with pt's son re PT recommendation for SNF. Son reports pt with no prior to SNF stay. Explained SNF placement process and answered questions. Son agreeable to SNF for STR. Will f/u with offers as available. Pt will require auth for SNF.   Paullette Boston, MSW, LCSW [phone] (coverage)                   Expected Discharge Plan: Skilled Nursing Facility Barriers to Discharge: Barriers Resolved   Patient Goals and CMS Choice     Choice offered to / list presented to : Adult Children [place] ownership interest in [facility].provided to:: Adult Children    Expected Discharge Plan and Services     Post Acute Care Choice: Skilled Nursing Facility Living arrangements for the past 2 months: Single Family Home                   DME Agency: [employer]                  Prior Living Arrangements/Services Living arrangements for the past 2 months: Single Family Home Lives with:: Adult Children Patient language and need for interpreter reviewed:: Yes        Need for Family Participation in Patient Care: Yes (Comment) Care giver support system in place?: Yes (comment) Current home services: DME Criminal Activity/Legal Involvement Pertinent to Current Situation/Hospitalization: No - Comment as needed  Activities of Daily Living   ADL Screening (condition at time of admission) Independently performs ADLs?: No Does the patient have a NEW difficulty with bathing/dressing/toileting/self-feeding that is expected to last >3 days?: No Does the patient have a NEW difficulty with getting in/out of bed, walking, or climbing stairs that is expected to last >3 days?: No Does the patient have a NEW  difficulty with communication that is expected to last >3 days?: No Is the patient deaf or have difficulty hearing?: No Does the patient have difficulty seeing, even when wearing glasses/contacts?: No Does the patient have difficulty concentrating, remembering, or making decisions?: Yes  Permission Sought/Granted Permission sought to share information with : Facility Industrial/product designer granted to share information with : Yes, Verbal Permission Granted              Emotional Assessment       Orientation: : Oriented to Self, Oriented to Place, Oriented to  Time, Oriented to Situation, Fluctuating Orientation (Suspected and/or reported Sundowners) Alcohol / Substance Use: Not Applicable Psych Involvement: No (comment)  Admission diagnosis:  Acute pulmonary edema (HCC) [J81.0] Acute cystitis without hematuria [N30.00] Transient confusion [R41.0] Acute metabolic encephalopathy [G93.41] Acute on chronic heart failure with preserved ejection fraction (HFpEF) (HCC) [I50.33] Patient Active Problem List   Diagnosis Date Noted   Chronic pain 09/06/2023   Acute metabolic encephalopathy 09/06/2023   Acute cystitis 09/05/2023   SBO (small bowel obstruction) (HCC) 07/12/2023   Asthma, chronic, unspecified asthma severity, with acute exacerbation 01/12/2023   Hypomagnesemia 01/12/2023   Bedbound 12/14/2022   Acute on chronic heart failure with preserved ejection fraction (HFpEF) (HCC) 11/23/2022   Pressure injury of skin 11/23/2022   Nocturnal hypoxemia 06/21/2022   Elevated LFTs 05/06/2022   Obesity hypoventilation syndrome (HCC) 05/06/2022   Chronic  respiratory failure with hypoxia (HCC) 04/22/2022   Hypokalemia 04/22/2022   Leg cramps 01/12/2022   Fever of unknown origin 08/12/2021   Sacral pressure sore 07/06/2021   Vitamin B6 deficiency 06/08/2021   Myelomalacia of cervical cord (HCC) 06/06/2021   Compressive cervical cord myelomalacia (HCC) 06/05/2021   B12  deficiency 06/05/2021   Folate deficiency 06/05/2021   Vitamin D  deficiency 06/05/2021   Leg weakness, bilateral 06/04/2021   Cervical atypia 01/06/2021   Steatosis of liver 01/06/2021   Hyponatremia 07/27/2019   Urinary urgency 04/04/2018   Chronic narcotic use 09/07/2016   Acute blood loss anemia 01/14/2016   Multiple gastric ulcers    PAN (polyarteritis nodosa) (HCC) 11/24/2015   GERD (gastroesophageal reflux disease) 01/08/2014   Routine general medical examination at a health care facility 04/23/2013   Bariatric surgery status 10/31/2012   S/P skin biopsy 10/31/2012   Psoriasis 01/09/2012   DDD (degenerative disc disease), lumbar 11/30/2011   Migraine headache    [name] (obstructive sleep apnea) 11/16/2010   HYPERGLYCEMIA, FASTING 10/08/2009   CONTRACTURE OF TENDON 08/17/2009   PARESTHESIA, HANDS 12/23/2008   Leg pain, right 11/05/2008   Physical debility 09/19/2008   ADVERSE DRUG REACTION 04/03/2008   PERIPHERAL EDEMA 03/26/2008   Obesity, morbid, BMI 50 or higher (HCC) 01/03/2007   Cellulitis 01/03/2007   Hyperlipidemia 09/22/2006   Fibromyalgia 09/20/2006   Anxiety and depression 06/28/2006   Essential hypertension 06/28/2006   PCP:  [name], Katherine E, MD Pharmacy:   [facility] [place], [place] - [address] [place] [address] Phone: [phone] Fax: [phone]  [facility] [address] [place] [address] Phone: [phone] Fax: [phone]     Social Drivers of Health (SDOH) Social History: SDOH Screenings   Food Insecurity: No Food Insecurity (09/06/2023)  Housing: Low Risk  (09/06/2023)  Transportation Needs: No Transportation Needs (09/06/2023)  Utilities: Not At Risk (09/06/2023)  Alcohol Screen: Low Risk  (11/02/2022)  Depression (PHQ2-9): Medium Risk (06/26/2023)  Financial Resource Strain: Low Risk  (11/02/2022)  Physical Activity: Inactive (11/02/2022)  Social Connections:  Moderately Integrated (09/06/2023)  Stress: Stress Concern Present (11/02/2022)  Tobacco Use: Low Risk  (09/06/2023)   SDOH Interventions:     Readmission Risk Interventions    07/14/2023   12:56 PM 04/24/2022   11:10 AM  Readmission Risk Prevention Plan  Transportation Screening Complete Complete  HRI or Home Care Consult  Complete  Social Work Consult for Recovery Care Planning/Counseling  Complete  Palliative Care Screening  Not Applicable  Medication Review Oceanographer) Referral to Pharmacy Referral to Pharmacy  [facility] or Home Care Consult Complete   Palliative Care Screening Not Applicable

## 2023-09-07 NOTE — Telephone Encounter (Signed)
 Faxed and placed in scan bin

## 2023-09-07 NOTE — NC FL2 (Signed)
 [place]  MEDICAID FL2 LEVEL OF CARE FORM     IDENTIFICATION  Patient Name: [name] Birthdate: 1970-12-26 Sex: female Admission Date (Current Location): 09/05/2023  [facility] and [place] Number:  Producer, television/film/video and Address:  [name], [address], [place], [place] [address]      Provider Number: [id number]  Attending Physician Name and Address:  Vada Garibaldi, MD  Relative Name and Phone Number:       Current Level of Care: Hospital Recommended Level of Care: Skilled Nursing Facility Prior Approval Number:    Date Approved/Denied:   PASRR Number: [id number]  Discharge Plan: SNF    Current Diagnoses: Patient Active Problem List   Diagnosis Date Noted   Chronic pain 09/06/2023   Acute metabolic encephalopathy 09/06/2023   Acute cystitis 09/05/2023   SBO (small bowel obstruction) (HCC) 07/12/2023   Asthma, chronic, unspecified asthma severity, with acute exacerbation 01/12/2023   Hypomagnesemia 01/12/2023   Bedbound 12/14/2022   Acute on chronic heart failure with preserved ejection fraction (HFpEF) (HCC) 11/23/2022   Pressure injury of skin 11/23/2022   Nocturnal hypoxemia 06/21/2022   Elevated LFTs 05/06/2022   Obesity hypoventilation syndrome (HCC) 05/06/2022   Chronic respiratory failure with hypoxia (HCC) 04/22/2022   Hypokalemia 04/22/2022   Leg cramps 01/12/2022   Fever of unknown origin 08/12/2021   Sacral pressure sore 07/06/2021   Vitamin B6 deficiency 06/08/2021   Myelomalacia of cervical cord (HCC) 06/06/2021   Compressive cervical cord myelomalacia (HCC) 06/05/2021   B12 deficiency 06/05/2021   Folate deficiency 06/05/2021   Vitamin D  deficiency 06/05/2021   Leg weakness, bilateral 06/04/2021   Cervical atypia 01/06/2021   Steatosis of liver 01/06/2021   Hyponatremia 07/27/2019   Urinary urgency 04/04/2018   Chronic narcotic use 09/07/2016   Acute blood loss anemia 01/14/2016   Multiple gastric ulcers     PAN (polyarteritis nodosa) (HCC) 11/24/2015   GERD (gastroesophageal reflux disease) 01/08/2014   Routine general medical examination at a health care facility 04/23/2013   Bariatric surgery status 10/31/2012   S/P skin biopsy 10/31/2012   Psoriasis 01/09/2012   DDD (degenerative disc disease), lumbar 11/30/2011   Migraine headache    [name] (obstructive sleep apnea) 11/16/2010   HYPERGLYCEMIA, FASTING 10/08/2009   CONTRACTURE OF TENDON 08/17/2009   PARESTHESIA, HANDS 12/23/2008   Leg pain, right 11/05/2008   Physical debility 09/19/2008   ADVERSE DRUG REACTION 04/03/2008   PERIPHERAL EDEMA 03/26/2008   Obesity, morbid, BMI 50 or higher (HCC) 01/03/2007   Cellulitis 01/03/2007   Hyperlipidemia 09/22/2006   Fibromyalgia 09/20/2006   Anxiety and depression 06/28/2006   Essential hypertension 06/28/2006    Orientation RESPIRATION BLADDER Height & Weight     Self, Time, Situation, Place   (4L [place]; CPAP at HS) Incontinent Weight: (!) 363 lb 15.7 oz (165.1 kg) Height:  5\' 1"  (154.9 cm)  BEHAVIORAL SYMPTOMS/MOOD NEUROLOGICAL BOWEL NUTRITION STATUS      Incontinent    AMBULATORY STATUS COMMUNICATION OF NEEDS Skin   Extensive Assist Verbally Other (Comment) (see notes)                       Personal Care Assistance Level of Assistance  Bathing, Feeding, Dressing Bathing Assistance: Maximum assistance Feeding assistance: Limited assistance Dressing Assistance: Maximum assistance     Functional Limitations Info  Sight, Hearing, Speech Sight Info: Adequate Hearing Info: Adequate Speech Info: Adequate    SPECIAL CARE FACTORS FREQUENCY  PT (By  licensed PT), OT (By licensed OT)                    Contractures Contractures Info: Not present    Additional Factors Info  Code Status Code Status Info: DNR             Current Medications (09/07/2023):  This is the current hospital active medication list Current Facility-Administered Medications  Medication Dose  Route Frequency Provider Last Rate Last Admin   acetaminophen  (TYLENOL ) tablet 650 mg  650 mg Oral Q6H PRN Opyd, Timothy S, MD       Or   acetaminophen  (TYLENOL ) suppository 650 mg  650 mg Rectal Q6H PRN Opyd, Santana Cue, MD       albuterol  (PROVENTIL ) (2.5 MG/3ML) 0.083% nebulizer solution 3 mL  3 mL Inhalation Q6H PRN Vada Garibaldi, MD       ALPRAZolam  (XANAX ) tablet 0.5 mg  0.5 mg Oral BID PRN Ghimire, Kuber, MD       ARIPiprazole  (ABILIFY ) tablet 15 mg  15 mg Oral Daily Opyd, Timothy S, MD   15 mg at 09/07/23 5956   buPROPion  (WELLBUTRIN  XL) 24 hr tablet 300 mg  300 mg Oral Daily Opyd, Timothy S, MD   300 mg at 09/07/23 0828   calcium  citrate (CALCITRATE - dosed in mg elemental calcium ) tablet 950 mg  1 tablet Oral Daily Ghimire, Kuber, MD   950 mg at 09/07/23 0827   conjugated estrogens  (PREMARIN ) vaginal cream 1 Applicatorful  1 Applicatorful Vaginal QHS Vada Garibaldi, MD   1 Applicatorful at 09/06/23 2239   cyanocobalamin  (VITAMIN B12) tablet 1,000 mcg  1,000 mcg Oral Daily Ghimire, Kuber, MD   1,000 mcg at 09/07/23 3875   DULoxetine  (CYMBALTA ) DR capsule 60 mg  60 mg Oral BID Opyd, Timothy S, MD   60 mg at 09/07/23 6433   enoxaparin  (LOVENOX ) injection 40 mg  40 mg Subcutaneous Q12H Vada Garibaldi, MD   40 mg at 09/07/23 0827   ferrous sulfate  tablet 325 mg  325 mg Oral Q breakfast Vada Garibaldi, MD   325 mg at 09/07/23 2951   fluticasone  furoate-vilanterol (BREO ELLIPTA ) 200-25 MCG/ACT 1 puff  1 puff Inhalation Daily Opyd, Santana Cue, MD   1 puff at 09/07/23 8841   folic acid  (FOLVITE ) tablet 1 mg  1 mg Oral Daily Ghimire, Kuber, MD   1 mg at 09/07/23 6606   gabapentin  (NEURONTIN ) capsule 600 mg  600 mg Oral BID PRN Opyd, Timothy S, MD       Gerhardt's butt cream   Topical BID Ghimire, Kuber, MD       HYDROcodone -acetaminophen  (NORCO) 10-325 MG per tablet 1 tablet  1 tablet Oral Q8H PRN Opyd, Timothy S, MD   1 tablet at 09/07/23 1052   ipratropium-albuterol  (DUONEB) 0.5-2.5 (3) MG/3ML  nebulizer solution 3 mL  3 mL Nebulization Q6H PRN Opyd, Timothy S, MD       leptospermum manuka honey (MEDIHONEY) paste 1 Application  1 Application Topical Daily Ghimire, Letty Raya, MD       magnesium  oxide (MAG-OX) tablet 400 mg  400 mg Oral BID Ghimire, Kuber, MD   400 mg at 09/07/23 3016   modafinil  (PROVIGIL ) tablet 200 mg  200 mg Oral Daily Ghimire, Kuber, MD   200 mg at 09/07/23 1052   nebivolol  (BYSTOLIC ) tablet 10 mg  10 mg Oral Daily Opyd, Timothy S, MD   10 mg at 09/06/23 1102   nitrofurantoin  (macrocrystal-monohydrate) (MACROBID ) capsule 100 mg  100 mg Oral Q12H Opyd, Timothy S, MD   100 mg at 09/07/23 0437   ondansetron  (ZOFRAN ) tablet 4 mg  4 mg Oral Q6H PRN Opyd, Timothy S, MD       Or   ondansetron  (ZOFRAN ) injection 4 mg  4 mg Intravenous Q6H PRN Opyd, Timothy S, MD   4 mg at 09/06/23 1610   Oxcarbazepine  (TRILEPTAL ) tablet 300 mg  300 mg Oral BID Opyd, Timothy S, MD   300 mg at 09/07/23 0827   pantoprazole  (PROTONIX ) EC tablet 40 mg  40 mg Oral BID Opyd, Timothy S, MD   40 mg at 09/07/23 0828   polyethylene glycol (MIRALAX  / GLYCOLAX ) packet 17 g  17 g Oral Daily PRN Opyd, Timothy S, MD       potassium chloride  SA (KLOR-CON  M) CR tablet 20 mEq  20 mEq Oral BID Vada Garibaldi, MD   20 mEq at 09/07/23 9604   sodium chloride  flush (NS) 0.9 % injection 3 mL  3 mL Intravenous Q12H Opyd, Timothy S, MD   3 mL at 09/07/23 5409     Discharge Medications: Please see discharge summary for a list of discharge medications.  Relevant Imaging Results:  Relevant Lab Results:   Additional Information SS# [id number] [place], [place]

## 2023-09-07 NOTE — Plan of Care (Addendum)
 Pt is alert and oriented with periods of drowsiness. Pt noted to flagged red mews upon arrival to floor 4/23. Checked chart with vitals . MEWs sheet added to reflect upcoming vitals needed this am.  Problem: Education: Goal: Knowledge of General Education information will improve Description: Including pain rating scale, medication(s)/side effects and non-pharmacologic comfort measures Outcome: Progressing   Problem: Health Behavior/Discharge Planning: Goal: Ability to manage health-related needs will improve Outcome: Progressing   Problem: Clinical Measurements: Goal: Ability to maintain clinical measurements within normal limits will improve Outcome: Progressing Goal: Will remain free from infection Outcome: Progressing Goal: Diagnostic test results will improve Outcome: Progressing Goal: Respiratory complications will improve Outcome: Progressing Goal: Cardiovascular complication will be avoided Outcome: Progressing   Problem: Activity: Goal: Risk for activity intolerance will decrease Outcome: Progressing   Problem: Nutrition: Goal: Adequate nutrition will be maintained Outcome: Progressing   Problem: Coping: Goal: Level of anxiety will decrease Outcome: Progressing   Problem: Elimination: Goal: Will not experience complications related to bowel motility Outcome: Progressing Goal: Will not experience complications related to urinary retention Outcome: Progressing   Problem: Pain Managment: Goal: General experience of comfort will improve and/or be controlled Outcome: Progressing   Problem: Safety: Goal: Ability to remain free from injury will improve Outcome: Progressing   Problem: Skin Integrity: Goal: Risk for impaired skin integrity will decrease Outcome: Progressing

## 2023-09-07 NOTE — Progress Notes (Signed)
 PROGRESS NOTE    [name]  XBJ:[id number] DOB: Jul 23, 1970 DOA: 09/05/2023 PCP: Jess Morita, MD    Brief Narrative:  53 year old with history of morbid obesity with a BMI of 71, sleep apnea, chronic hypoxic respite failure on home oxygen , anxiety depression and chronic pain, bedbound status, chronic heart failure with preserved ejection fraction presented from home with altered mental status.  She does have frequent episodes of disorientation when not using supplemental oxygen  as per family.  Patient herself denies any complaints.  In the emergency room afebrile, saturation more than 90% on 2 L oxygen .  Blood pressure stable.  Chest x-ray with cardiomegaly and central vascular congestion.  She was given IV Lasix  and IV Zosyn  in the ER.  Urinalysis was abnormal.  Admitted with altered mentation.  Subjective: Patient was seen and examined.  No overnight events.  Alert awake and interactive today.  Denies any urinary symptoms.  Afebrile.  Her friend is at the bedside.  Agreeable to go to rehab.   Assessment & Plan:   Acute metabolic encephalopathy: Multifactorial , sleep apnea , polypharmacy, benzodiazepines  Mental status is currently better . No focal neurological deficits.  [name], not treated , waiting for results from home sleep study.  Use CPAP as needed.  Currently on oxygen . Keep on oxygen  to keep sats >90% Decrease dose of ativan , continue gabapentin , cymbalta , abilify , wellbutrin   PT/OT- refer to SNF  Acute UTI , present on admission: h/o ESBL . Confused . Not sure urinary symptoms . Started on nitrofurantoin  .  Continue nitrofurantoin .  Will wait for urine cultures.  Acute on chronic HFpEF: Previously known normal ejection fraction.  BNP elevated.  Chest x-ray with cardiomegaly.  Patient was given IV Lasix .  Renal functions worsening.  Discontinue further diuretics. Echocardiogram today.  Debility: Profound physical debility.  Needs PT OT and rehab.  Depression  anxiety: Continue Xanax  on reduced dose, Abilify , Wellbutrin , Cymbalta  and Trileptal .  Chronic pain syndrome: Avoid excess dose of medications.  Continue gabapentin .  Reduced dose of Norco.  Low-grade fever: Likely secondary to UTI.  Respiratory virus panel negative.   DVT prophylaxis: enoxaparin  (LOVENOX ) injection 40 mg Start: 09/06/23 2200   Code Status: DNR with limited intervention Family Communication: None today. Disposition Plan: Status is: Inpatient Remains inpatient appropriate because: Altered mental status, hypoxemia Medically stabilizing.  Needs a skilled nursing facility.     Consultants:  None  Procedures:  None  Antimicrobials:  Nitrofurantoin  4/22---     Objective: Vitals:   09/07/23 0439 09/07/23 0535 09/07/23 0833 09/07/23 1215  BP:  100/60 95/62 119/66  Pulse:  82 76 85  Resp:  16  19  Temp:  98.1 F (36.7 C) 97.8 F (36.6 C) 98.6 F (37 C)  TempSrc:  Oral Oral   SpO2:  100% 97% 100%  Weight: (!) 166.6 kg (!) 165.1 kg    Height:        Intake/Output Summary (Last 24 hours) at 09/07/2023 1318 Last data filed at 09/07/2023 0600 Gross per 24 hour  Intake 480 ml  Output --  Net 480 ml   Filed Weights   09/05/23 1919 09/07/23 0439 09/07/23 0535  Weight: (!) 170 kg (!) 166.6 kg (!) 165.1 kg    Examination:  General exam: Appears calm and comfortable.  Frail.  Older than stated age.  Interactive today. Respiratory system: Clear to auscultation. Respiratory effort normal.  No added sounds. SpO2: 100 % O2 Flow Rate (L/min): 4 L/min FiO2 (%): 28 %  Cardiovascular system: S1 & S2 heard, RRR.  Gastrointestinal system: Soft.  Nontender.  Bowel sound present. Central nervous system: Alert and awake.  Mostly oriented.  Moves all extremities. Gross generalized weakness.        Data Reviewed: I have personally reviewed following labs and imaging studies  CBC: Recent Labs  Lab 09/05/23 1940 09/05/23 1947 09/06/23 0627 09/07/23 0529   WBC 10.0  --  8.0 15.5*  NEUTROABS 7.6  --   --   --   HGB 11.7* 12.6 11.8* 11.2*  HCT 36.7 37.0 38.1 37.0  MCV 96.8  --  95.3 96.1  PLT 232  --  257 218   Basic Metabolic Panel: Recent Labs  Lab 09/05/23 1940 09/05/23 1947 09/06/23 0627 09/07/23 0529  NA 134* 133* 133* 130*  K 4.2 4.1 3.4* 4.6  CL 90*  --  86* 85*  CO2 36*  --  39* 33*  GLUCOSE 102*  --  87 101*  BUN 7  --  5* 17  CREATININE 0.55  --  0.62 1.39*  CALCIUM  8.5*  --  8.3* 8.1*  MG  --   --  1.6*  --    GFR: Estimated Creatinine Clearance: 70 mL/min (A) (by C-G formula based on SCr of 1.39 mg/dL (H)). Liver Function Tests: No results for input(s): "AST", "ALT", "ALKPHOS", "BILITOT", "PROT", "ALBUMIN" in the last 168 hours. No results for input(s): "LIPASE", "AMYLASE" in the last 168 hours. No results for input(s): "AMMONIA" in the last 168 hours. Coagulation Profile: No results for input(s): "INR", "PROTIME" in the last 168 hours. Cardiac Enzymes: No results for input(s): "CKTOTAL", "CKMB", "CKMBINDEX", "TROPONINI" in the last 168 hours. BNP (last 3 results) No results for input(s): "PROBNP" in the last 8760 hours. HbA1C: No results for input(s): "HGBA1C" in the last 72 hours. CBG: No results for input(s): "GLUCAP" in the last 168 hours. Lipid Profile: No results for input(s): "CHOL", "HDL", "LDLCALC", "TRIG", "CHOLHDL", "LDLDIRECT" in the last 72 hours. Thyroid  Function Tests: No results for input(s): "TSH", "T4TOTAL", "FREET4", "T3FREE", "THYROIDAB" in the last 72 hours. Anemia Panel: No results for input(s): "VITAMINB12", "FOLATE", "FERRITIN", "TIBC", "IRON", "RETICCTPCT" in the last 72 hours. Sepsis Labs: No results for input(s): "PROCALCITON", "LATICACIDVEN" in the last 168 hours.  Recent Results (from the past 240 hours)  Urine Culture (for pregnant, neutropenic or urologic patients or patients with an indwelling urinary catheter)     Status: Abnormal (Preliminary result)   Collection Time:  09/05/23 12:53 AM   Specimen: Urine, Clean Catch  Result Value Ref Range Status   Specimen Description URINE, CLEAN CATCH  Final   Special Requests NONE  Final   Culture (A)  Final    >=100,000 COLONIES/mL GRAM NEGATIVE RODS IDENTIFICATION TO FOLLOW Performed at [facility], [address] [place], [place] [address]    Report Status PENDING  Incomplete  Resp panel by RT-PCR (RSV, Flu A&B, Covid) Anterior Nasal Swab     Status: None   Collection Time: 09/06/23  6:33 PM   Specimen: Anterior Nasal Swab  Result Value Ref Range Status   SARS Coronavirus 2 by RT PCR NEGATIVE NEGATIVE Final   Influenza A by PCR NEGATIVE NEGATIVE Final   Influenza B by PCR NEGATIVE NEGATIVE Final    Comment: (NOTE) The Xpert Xpress SARS-CoV-2/FLU/RSV plus assay is intended as an aid in the diagnosis of influenza from Nasopharyngeal swab specimens and should not be used as a sole basis for treatment. Nasal washings  and aspirates are unacceptable for Xpert Xpress SARS-CoV-2/FLU/RSV testing.  Fact Sheet for Patients: BloggerCourse.com  Fact Sheet for Healthcare Providers: SeriousBroker.it  This test is not yet approved or cleared by the [place]  FDA and has been authorized for detection and/or diagnosis of SARS-CoV-2 by FDA under an Emergency Use Authorization (EUA). This EUA will remain in effect (meaning this test can be used) for the duration of the COVID-19 declaration under Section 564(b)(1) of the Act, 21 U.S.C. section 360bbb-3(b)(1), unless the authorization is terminated or revoked.     Resp Syncytial Virus by PCR NEGATIVE NEGATIVE Final    Comment: (NOTE) Fact Sheet for Patients: BloggerCourse.com  Fact Sheet for Healthcare Providers: SeriousBroker.it  This test is not yet approved or cleared by the [place]  FDA and has been authorized for detection and/or diagnosis of  SARS-CoV-2 by FDA under an Emergency Use Authorization (EUA). This EUA will remain in effect (meaning this test can be used) for the duration of the COVID-19 declaration under Section 564(b)(1) of the Act, 21 U.S.C. section 360bbb-3(b)(1), unless the authorization is terminated or revoked.  Performed at [facility], [address] [place], [place] [address]          Radiology Studies: DG Chest Port 1 View Result Date: 09/05/2023 CLINICAL DATA:  Hypoxia EXAM: PORTABLE CHEST 1 VIEW COMPARISON:  Chest x-ray 07/14/2023 FINDINGS: There is stable moderate elevation of the right hemidiaphragm with atelectasis in the right lung base. There is also a band of atelectasis in the retrocardiac region. The heart is enlarged there is central pulmonary vascular congestion. There is no pneumothorax or pleural effusion identified. No acute fractures are seen. IMPRESSION: 1. Cardiomegaly with central pulmonary vascular congestion. 2. Stable moderate elevation of the right hemidiaphragm with atelectasis in the right lung base. 3. Band of atelectasis in the retrocardiac region. Electronically Signed   By: Tyron Gallon M.D.   On: 09/05/2023 20:03        Scheduled Meds:  ARIPiprazole   15 mg Oral Daily   buPROPion   300 mg Oral Daily   calcium  citrate  1 tablet Oral Daily   conjugated estrogens   1 Applicatorful Vaginal QHS   cyanocobalamin   1,000 mcg Oral Daily   DULoxetine   60 mg Oral BID   enoxaparin  (LOVENOX ) injection  40 mg Subcutaneous Q12H   ferrous sulfate   325 mg Oral Q breakfast   fluticasone  furoate-vilanterol  1 puff Inhalation Daily   folic acid   1 mg Oral Daily   Gerhardt's butt cream   Topical BID   leptospermum manuka honey  1 Application Topical Daily   magnesium  oxide  400 mg Oral BID   modafinil   200 mg Oral Daily   nebivolol   10 mg Oral Daily   nitrofurantoin  (macrocrystal-monohydrate)  100 mg Oral Q12H   Oxcarbazepine   300 mg Oral BID   pantoprazole   40 mg Oral BID    potassium chloride   20 mEq Oral BID   sodium chloride  flush  3 mL Intravenous Q12H   Continuous Infusions:   LOS: 1 day       Scotland Korver, MD [facility]

## 2023-09-07 NOTE — Progress Notes (Signed)
 Yellow MEWS due to low BP. Rechecked at 89/48mmHg. Pt remains asymptomatic, reported it is usual for her to have low BP. MD was informed, no further order.  09/07/23 1619  Assess: MEWS Score  Temp 97.8 F (36.6 C)  BP (!) 72/44  MAP (mmHg) (!) 52  Pulse Rate 82  SpO2 100 %  Assess: MEWS Score  MEWS Temp 0  MEWS Systolic 2  MEWS Pulse 0  MEWS RR 0  MEWS LOC 0  MEWS Score 2  MEWS Score Color Yellow  Assess: if the MEWS score is Yellow or Red  Were vital signs accurate and taken at a resting state? Yes  Does the patient meet 2 or more of the SIRS criteria? No  Assess: SIRS CRITERIA  SIRS Temperature  0  SIRS Respirations  0  SIRS Pulse 0  SIRS WBC 0  SIRS Score Sum  0

## 2023-09-07 NOTE — Progress Notes (Signed)
*  PRELIMINARY RESULTS* Echocardiogram 2D Echocardiogram has been performed.  [name] 09/07/2023, 1:58 PM

## 2023-09-07 NOTE — Progress Notes (Signed)
 09/07/23 0900  Spiritual Encounters  Type of Visit Initial  Care provided to: Patient  Referral source Patient request  Reason for visit Advance directives  OnCall Visit No   Chaplain responded to a consult request for Advance Directive education.   Chaplain provided the Advance Directive packet as well as education on Advance Directives-documents an individual completes to communicate their health care directions in advance of a time when they may need them. Chaplain informed pt the documents which may be completed here in the hospital are the Living Will and Health Care Power of [place].  Chaplain informed that the Health Care Power of [place] is a legal document in which an individual names another person, their Health Care Agent, to make health care decisions when the individual is not able to make them for themselves. The Health Care Agent's function can be temporary or permanent depending on the pt's ability to make and communicate those decisions independently. Chaplain informed pt in the absence of a Health Care Power of Attorney, the state of   directs health care providers to look to the following individuals in the order listed: legal guardian; an attorney?in?fact under a general power of attorney (POA) if that POA includes the right to make health care decisions; a husband or wife; a majority of parents and adult children; a majority of adult brothers and sisters; or an individual who has an established relationship with you, who is acting in good faith and who can convey your wishes.  If none of these person are available or willing to make medical decisions on a patient's behalf, the law allows the patient's doctor to make decisions for them as long as another doctor agrees with those decisions.  Chaplain also informed the patient that the Health Care agent has no decision-making authority over any affairs other than those related to his or her medical care.  The  chaplain further educated the pt that a Living Will is a legal document that allows an individual to state his or her desire not to receive life-prolonging measures in the event that they have a condition that is incurable and will result in their death in a short period of time; they are unconscious, and doctors are confident that they will not regain consciousness; and/or they have advanced dementia or other substantial and irreversible loss of mental function. The chaplain informed pt that life-prolonging measures are medical treatments that would only serve to postpone death, including breathing machines, kidney dialysis, antibiotics, artificial nutrition and hydration (tube feeding), and similar forms of treatment and that if an individual is able to express their wishes, they may also make them known without the use of a Living Will, but in the event that an individual is not able to express their wishes themselves, a Living Will allows medical providers and the pt's family and friends ensure that they are not making decisions on the pt's behalf, but rather serving as the pt's voice to convey decisions the pt has already made.  The patient is aware that the decision to create an advance directive is theirs alone and they may chose not to complete the documents or may chose to complete one portion or both.  The patient was informed that they can revoke the documents at any time by striking through them and writing void or by completing new documents, but that it is also advisable that the individual verbally notify interested parties that their wishes have changed.  They are also aware that the  document must be signed in the presence of a notary public and two witnesses and that this can be done while the patient is still admitted to the hospital or after discharge in the community. If they decide to complete Advance Directives after being discharged from the hospital, they have been advised to notify all  interested parties and to provide those documents to their physicians and loved ones in addition to bringing them to the hospital in the event of another hospitalization.  The chaplain informed the pt that if they desire to proceed with completing Advance Directive Documentation while they are still admitted, notary services are typically available at [facility] between the hours of 1:00 and 3:30 Monday-Thursday.    When the patient is ready to have these documents completed, the patient should request that their nurse place a spiritual care consult and indicate that the patient is ready to have their advance directives notarized so that arrangements for witnesses and notary public can be made.  Please page spiritual care if the patient desires further education or has questions.      M.Kubra Welby Hale Resident [phone]

## 2023-09-08 ENCOUNTER — Other Ambulatory Visit: Payer: Self-pay | Admitting: Family Medicine

## 2023-09-08 DIAGNOSIS — M300 Polyarteritis nodosa: Principal | ICD-10-CM

## 2023-09-08 LAB — URINE CULTURE: Culture: >=100000 — AB

## 2023-09-08 MED ORDER — GABAPENTIN 600 MG TABLET
ORAL_TABLET | Freq: Three times a day (TID) | ORAL | 3 refills | 0.00000 days
Start: 2023-09-08 — End: ?

## 2023-09-11 ENCOUNTER — Ambulatory Visit (HOSPITAL_BASED_OUTPATIENT_CLINIC_OR_DEPARTMENT_OTHER): Admitting: Pulmonary Disease

## 2023-09-11 MED ORDER — GABAPENTIN 600 MG TABLET
ORAL_TABLET | Freq: Three times a day (TID) | ORAL | 3 refills | 90.00000 days
Start: 2023-09-11 — End: ?

## 2023-09-13 ENCOUNTER — Telehealth: Payer: Self-pay | Admitting: Family Medicine

## 2023-09-13 DIAGNOSIS — J45909 Unspecified asthma, uncomplicated: Secondary | ICD-10-CM | POA: Diagnosis not present

## 2023-09-13 NOTE — Telephone Encounter (Signed)
 Pt is deceased and a order came in fax from [facility] solutiions please review, sign and fax back to [facility] documents placed in providers box up front. Fax #-- [phone] Please advise, Thanks

## 2023-09-14 ENCOUNTER — Ambulatory Visit: Admitting: Family Medicine

## 2023-09-14 NOTE — Telephone Encounter (Signed)
 Form completed and returned to [facility] to fax

## 2023-09-14 NOTE — Progress Notes (Signed)
 Around 12:48,CCMD called to go check patient,when this RN looked at the monitor by the Nurse's station patient was showing asystole. Upon entering room,patient on the bed,was unresponsive and no pulse appreciated. Patient is DNR/DNI limited. Patient's expiration was pronounced at 00:48 with Anselmo Bast. 0110-Segars,MD was notified. MD notified patient's son, Hinda Lucy at 01:40. [facility] notified and spoke with Lonia Ro at 1:50, referral # [phone]. Patient might be eligible for tissue donation.

## 2023-09-14 NOTE — Telephone Encounter (Signed)
 Papers have been placed in sign folder

## 2023-09-14 NOTE — Death Summary Note (Signed)
 DEATH SUMMARY   Patient Details  Name: [name] MRN: [id number] DOB: 08-06-1970 WUJ:[id number], Erica Griffon, MD Admission/Discharge Information   Admit Date:  2023/10/03  Date of Death: Date of Death: Oct 06, 2023  Time of Death: Time of Death: 10-10-46  Length of Stay: 2   Principle Cause of death: Hypoxemia due to Obesity hypoventilation   Hospital Diagnoses: Principal Problem:   Acute on chronic heart failure with preserved ejection fraction (HFpEF) (HCC) Active Problems:   Chronic respiratory failure with hypoxia (HCC)   Anxiety and depression   Physical debility   [name] (obstructive sleep apnea)   Obesity hypoventilation syndrome (HCC)   Acute cystitis   Chronic pain   Acute metabolic encephalopathy   Hospital course:  Patient is a 53 year old with history of morbid obesity with a BMI of 71, probable sleep apnea, chronic hypoxic respite failure on home oxygen , anxiety , depression and chronic pain syndrome. She is bed bound , has chronic heart failure with preserved ejection fraction presented from home with altered mental status.  She does have frequent episodes of disorientation when not using supplemental oxygen  as per family.  Patient herself denied any complaints.  In the emergency room afebrile, saturation more than 90% on 2 L oxygen .  Blood pressures stable.  Chest x-ray with mostly chronic cardiomegaly and central vascular congestion.  She was given IV Lasix  and IV Zosyn  in the ER.  Urinalysis was abnormal.  Patient was unable to go home.  She was admitted to the hospital and we are looking for skilled nursing facility for rehab placement.  Patient recently had sleep study done and results were pending.  On her blood gas analysis, there was no evidence of CO2 retention.  Patient was symptomatically treated.  She was kept on supplemental oxygen .  Blood gas analysis with pCO2 of 53 and normal pH not indicating hypercapnia.  Her medications were adjusted.  Patient had done good  clinical recovery.  She had abnormal urine without urinary symptoms of any urgency or hesitancy.  Patient was started on oral nitrofurantoin  for her UTI.  Her mental status had improved and was ready to move to skilled nursing facility.  Patient was fairly stable with normal mentation and the plan was to send her to rehab.  She was hemodynamically stable.  On the night of 4/24, she was on telemetry monitor that showed asystole.  Nurse found her unresponsive and without signs of life.  Since patient had DO NOT RESUSCITATE orders, patient was allowed natural death.  Family aware informed.  Primary cause of death is likely PEA arrest secondary to hypoxemia. Patient would have encephalopathy when she removes her oxygen  at home also. Patient also with extensive underlying co-morbidities.        Procedures: None  Consultations: None  The results of significant diagnostics from this hospitalization (including imaging, microbiology, ancillary and laboratory) are listed below for reference.   Significant Diagnostic Studies: ECHOCARDIOGRAM COMPLETE Result Date: 09/07/2023    ECHOCARDIOGRAM REPORT   Patient Name:   [name] Date of Exam: 09/07/2023 Medical Rec #:  [id number]    Height:       61.0 in Accession #:    [id number]   Weight:       364.0 lb Date of Birth:  [birth year]    BSA:          2.436 m Patient Age:    52 years     BP:  95/61 mmHg Patient Gender: F            HR:           88 bpm. Exam Location:  Inpatient Procedure: 2D Echo, Cardiac Doppler and Color Doppler (Both Spectral and Color            Flow Doppler were utilized during procedure). Indications:    Dyspnea  History:        Patient has prior history of Echocardiogram examinations, most                 recent 11/21/2022. HFpEF, Signs/Symptoms:Dyspnea; Risk                 Factors:Hypertension, Dyslipidemia and Sleep Apnea.  Sonographer:    Adelia Homestead RVT RCS Referring Phys: Vada Garibaldi  Sonographer Comments: Technically  challenging study due to limited acoustic windows, Technically difficult study due to poor echo windows, suboptimal apical window, no apical window, no subcostal window and patient is obese. Image acquisition challenging  due to patient body habitus. Due to limited and very poor windows and patient unable to reposition, was unable to use definity . No apical and no subcostal views. IMPRESSIONS  1. Left ventricular ejection fraction, by estimation, is 60 to 65%. The left ventricle has normal function. The left ventricle has no regional wall motion abnormalities. Left ventricular diastolic parameters are consistent with Grade I diastolic dysfunction (impaired relaxation).  2. RV not well visualized but appears significantly enlarged. Function visually mildly reduced. Right ventricular systolic function was not well visualized. The right ventricular size is moderately enlarged. Tricuspid regurgitation signal is inadequate for assessing PA pressure.  3. Left atrial size was mildly dilated.  4. The mitral valve is grossly normal. No evidence of mitral valve regurgitation. No evidence of mitral stenosis.  5. The aortic valve is tricuspid. Aortic valve regurgitation is not visualized. No aortic stenosis is present.  6. The inferior vena cava is normal in size with greater than 50% respiratory variability, suggesting right atrial pressure of 3 mmHg. FINDINGS  Left Ventricle: Left ventricular ejection fraction, by estimation, is 60 to 65%. The left ventricle has normal function. The left ventricle has no regional wall motion abnormalities. The left ventricular internal cavity size was normal in size. There is  no left ventricular hypertrophy. Left ventricular diastolic parameters are consistent with Grade I diastolic dysfunction (impaired relaxation). Right Ventricle: RV not well visualized but appears significantly enlarged. Function visually mildly reduced. The right ventricular size is moderately enlarged. No increase in  right ventricular wall thickness. Right ventricular systolic function was not well visualized. Tricuspid regurgitation signal is inadequate for assessing PA pressure. Left Atrium: Left atrial size was mildly dilated. Right Atrium: Right atrial size was not well visualized. Pericardium: There is no evidence of pericardial effusion. Mitral Valve: The mitral valve is grossly normal. Mild mitral annular calcification. No evidence of mitral valve regurgitation. No evidence of mitral valve stenosis. Tricuspid Valve: The tricuspid valve is not well visualized. No evidence of tricuspid stenosis. Aortic Valve: The aortic valve is tricuspid. Aortic valve regurgitation is not visualized. No aortic stenosis is present. Pulmonic Valve: The pulmonic valve was not well visualized. Pulmonic valve regurgitation is not visualized. No evidence of pulmonic stenosis. Aorta: The aortic root is normal in size and structure. Venous: The inferior vena cava is normal in size with greater than 50% respiratory variability, suggesting right atrial pressure of 3 mmHg. IAS/Shunts: The interatrial septum was not well visualized.  LEFT VENTRICLE  PLAX 2D LVIDd:         5.30 cm   Diastology LVIDs:         3.60 cm   LV e' medial:    7.29 cm/s LV PW:         1.30 cm   LV E/e' medial:  9.1 LV IVS:        1.30 cm   LV e' lateral:   10.40 cm/s LVOT diam:     2.00 cm   LV E/e' lateral: 6.3 LVOT Area:     3.14 cm  LEFT ATRIUM           Index LA diam:      3.30 cm 1.35 cm/m LA Vol (A4C): 82.7 ml 33.95 ml/m                        PULMONIC VALVE AORTA                 PV Vmax:       0.83 m/s Ao Root diam: 2.80 cm PV Peak grad:  2.8 mmHg Ao Asc diam:  3.00 cm  MITRAL VALVE MV Area (PHT): 5.27 cm    SHUNTS MV Decel Time: 144 msec    Systemic Diam: 2.00 cm MV E velocity: 66.00 cm/s MV A velocity: 55.50 cm/s MV E/A ratio:  1.19 Arta Lark Electronically signed by Arta Lark Signature Date/Time: 09/07/2023/2:02:50 PM    Final    DG Chest Port 1  View Result Date: 09/05/2023 CLINICAL DATA:  Hypoxia EXAM: PORTABLE CHEST 1 VIEW COMPARISON:  Chest x-ray 07/14/2023 FINDINGS: There is stable moderate elevation of the right hemidiaphragm with atelectasis in the right lung base. There is also a band of atelectasis in the retrocardiac region. The heart is enlarged there is central pulmonary vascular congestion. There is no pneumothorax or pleural effusion identified. No acute fractures are seen. IMPRESSION: 1. Cardiomegaly with central pulmonary vascular congestion. 2. Stable moderate elevation of the right hemidiaphragm with atelectasis in the right lung base. 3. Band of atelectasis in the retrocardiac region. Electronically Signed   By: Tyron Gallon M.D.   On: 09/05/2023 20:03    Microbiology: Recent Results (from the past 240 hours)  Urine Culture (for pregnant, neutropenic or urologic patients or patients with an indwelling urinary catheter)     Status: Abnormal   Collection Time: 09/05/23 12:53 AM   Specimen: Urine, Clean Catch  Result Value Ref Range Status   Specimen Description URINE, CLEAN CATCH  Final   Special Requests   Final    NONE Performed at [facility], [address] [place], [place] [address]    Culture (A)  Final    >=100,000 COLONIES/mL ESCHERICHIA COLI Confirmed Extended Spectrum Beta-Lactamase Producer (ESBL).  In bloodstream infections from ESBL organisms, carbapenems are preferred over piperacillin /tazobactam. They are shown to have a lower risk of mortality.    Report Status 09/17/23 FINAL  Final   Organism ID, Bacteria ESCHERICHIA COLI (A)  Final      Susceptibility   Escherichia coli - MIC*    AMPICILLIN >=32 RESISTANT Resistant     CEFAZOLIN >=64 RESISTANT Resistant     CEFEPIME  16 RESISTANT Resistant     CEFTRIAXONE  >=64 RESISTANT Resistant     CIPROFLOXACIN  >=4 RESISTANT Resistant     GENTAMICIN <=1 SENSITIVE Sensitive     IMIPENEM <=0.25 SENSITIVE Sensitive     NITROFURANTOIN  32 SENSITIVE  Sensitive  TRIMETH/SULFA <=20 SENSITIVE Sensitive     AMPICILLIN/SULBACTAM 4 SENSITIVE Sensitive     PIP/TAZO <=4 SENSITIVE Sensitive ug/mL    * >=100,000 COLONIES/mL ESCHERICHIA COLI  Resp panel by RT-PCR (RSV, Flu A&B, Covid) Anterior Nasal Swab     Status: None   Collection Time: 09/06/23  6:33 PM   Specimen: Anterior Nasal Swab  Result Value Ref Range Status   SARS Coronavirus 2 by RT PCR NEGATIVE NEGATIVE Final   Influenza A by PCR NEGATIVE NEGATIVE Final   Influenza B by PCR NEGATIVE NEGATIVE Final    Comment: (NOTE) The Xpert Xpress SARS-CoV-2/FLU/RSV plus assay is intended as an aid in the diagnosis of influenza from Nasopharyngeal swab specimens and should not be used as a sole basis for treatment. Nasal washings and aspirates are unacceptable for Xpert Xpress SARS-CoV-2/FLU/RSV testing.  Fact Sheet for Patients: BloggerCourse.com  Fact Sheet for Healthcare Providers: SeriousBroker.it  This test is not yet approved or cleared by the [place]  FDA and has been authorized for detection and/or diagnosis of SARS-CoV-2 by FDA under an Emergency Use Authorization (EUA). This EUA will remain in effect (meaning this test can be used) for the duration of the COVID-19 declaration under Section 564(b)(1) of the Act, 21 U.S.C. section 360bbb-3(b)(1), unless the authorization is terminated or revoked.     Resp Syncytial Virus by PCR NEGATIVE NEGATIVE Final    Comment: (NOTE) Fact Sheet for Patients: BloggerCourse.com  Fact Sheet for Healthcare Providers: SeriousBroker.it  This test is not yet approved or cleared by the [place]  FDA and has been authorized for detection and/or diagnosis of SARS-CoV-2 by FDA under an Emergency Use Authorization (EUA). This EUA will remain in effect (meaning this test can be used) for the duration of the COVID-19 declaration under  Section 564(b)(1) of the Act, 21 U.S.C. section 360bbb-3(b)(1), unless the authorization is terminated or revoked.  Performed at [facility], [address], [place], [place] [address]       Signed: Vada Garibaldi, MD 2023-09-20

## 2023-09-14 DEATH — deceased

## 2023-09-18 NOTE — Telephone Encounter (Signed)
 I have not received paper work for patient to be faxed back.

## 2023-09-18 NOTE — Telephone Encounter (Signed)
 Faxed

## 2023-10-02 ENCOUNTER — Ambulatory Visit: Admitting: Clinical

## 2023-10-15 DEATH — deceased

## 2023-11-07 ENCOUNTER — Ambulatory Visit: Admitting: Cardiology
